# Patient Record
Sex: Female | Born: 1945
Health system: Southern US, Community
[De-identification: ages and names within clinical notes are randomized; demographics above are authoritative.]

## PROBLEM LIST (undated history)

## (undated) DIAGNOSIS — F32A Depression, unspecified: Secondary | ICD-10-CM

## (undated) DIAGNOSIS — R0602 Shortness of breath: Secondary | ICD-10-CM

## (undated) DIAGNOSIS — D649 Anemia, unspecified: Secondary | ICD-10-CM

## (undated) DIAGNOSIS — I5032 Chronic diastolic (congestive) heart failure: Secondary | ICD-10-CM

## (undated) DIAGNOSIS — I099 Rheumatic heart disease, unspecified: Secondary | ICD-10-CM

## (undated) DIAGNOSIS — I10 Essential (primary) hypertension: Secondary | ICD-10-CM

## (undated) DIAGNOSIS — I4891 Unspecified atrial fibrillation: Secondary | ICD-10-CM

## (undated) DIAGNOSIS — K219 Gastro-esophageal reflux disease without esophagitis: Secondary | ICD-10-CM

## (undated) DIAGNOSIS — J45909 Unspecified asthma, uncomplicated: Secondary | ICD-10-CM

## (undated) DIAGNOSIS — E119 Type 2 diabetes mellitus without complications: Secondary | ICD-10-CM

## (undated) DIAGNOSIS — C50919 Malignant neoplasm of unspecified site of unspecified female breast: Secondary | ICD-10-CM

## (undated) DIAGNOSIS — F329 Major depressive disorder, single episode, unspecified: Secondary | ICD-10-CM

## (undated) DIAGNOSIS — K3184 Gastroparesis: Secondary | ICD-10-CM

## (undated) DIAGNOSIS — E039 Hypothyroidism, unspecified: Secondary | ICD-10-CM

## (undated) DIAGNOSIS — I5020 Unspecified systolic (congestive) heart failure: Secondary | ICD-10-CM

## (undated) DIAGNOSIS — K5730 Diverticulosis of large intestine without perforation or abscess without bleeding: Secondary | ICD-10-CM

## (undated) DIAGNOSIS — I499 Cardiac arrhythmia, unspecified: Secondary | ICD-10-CM

## (undated) DIAGNOSIS — R55 Syncope and collapse: Secondary | ICD-10-CM

## (undated) DIAGNOSIS — I6529 Occlusion and stenosis of unspecified carotid artery: Secondary | ICD-10-CM

## (undated) DIAGNOSIS — E785 Hyperlipidemia, unspecified: Secondary | ICD-10-CM

## (undated) DIAGNOSIS — I509 Heart failure, unspecified: Secondary | ICD-10-CM

## (undated) DIAGNOSIS — I4892 Unspecified atrial flutter: Secondary | ICD-10-CM

## (undated) DIAGNOSIS — K649 Unspecified hemorrhoids: Secondary | ICD-10-CM

## (undated) DIAGNOSIS — R011 Cardiac murmur, unspecified: Secondary | ICD-10-CM

## (undated) DIAGNOSIS — Z953 Presence of xenogenic heart valve: Secondary | ICD-10-CM

## (undated) DIAGNOSIS — E871 Hypo-osmolality and hyponatremia: Secondary | ICD-10-CM

## (undated) DIAGNOSIS — I639 Cerebral infarction, unspecified: Secondary | ICD-10-CM

## (undated) DIAGNOSIS — N182 Chronic kidney disease, stage 2 (mild): Secondary | ICD-10-CM

## (undated) DIAGNOSIS — R7301 Impaired fasting glucose: Secondary | ICD-10-CM

## (undated) HISTORY — DX: Gastro-esophageal reflux disease without esophagitis: K21.9

## (undated) HISTORY — DX: Rheumatic heart disease, unspecified: I09.9

## (undated) HISTORY — PX: DILATION AND CURETTAGE OF UTERUS: SHX78

## (undated) HISTORY — PX: CARDIAC CATHETERIZATION: SHX172

## (undated) HISTORY — DX: Hyperlipidemia, unspecified: E78.5

## (undated) HISTORY — DX: Gastroparesis: K31.84

## (undated) HISTORY — DX: Syncope and collapse: R55

## (undated) HISTORY — DX: Anemia, unspecified: D64.9

## (undated) HISTORY — DX: Impaired fasting glucose: R73.01

## (undated) HISTORY — PX: BACK SURGERY: SHX140

## (undated) HISTORY — DX: Unspecified hemorrhoids: K64.9

## (undated) HISTORY — DX: Diverticulosis of large intestine without perforation or abscess without bleeding: K57.30

## (undated) HISTORY — DX: Malignant neoplasm of unspecified site of unspecified female breast: C50.919

## (undated) HISTORY — DX: Cerebral infarction, unspecified: I63.9

## (undated) HISTORY — DX: Essential (primary) hypertension: I10

## (undated) HISTORY — DX: Chronic kidney disease, stage 2 (mild): N18.2

## (undated) HISTORY — DX: Heart failure, unspecified: I50.9

## (undated) HISTORY — DX: Occlusion and stenosis of unspecified carotid artery: I65.29

## (undated) HISTORY — DX: Cardiac arrhythmia, unspecified: I49.9

---
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
## 1971-10-04 HISTORY — PX: TUBAL LIGATION: SHX77

## 1986-04-30 ENCOUNTER — Encounter: Payer: Self-pay | Admitting: Cardiology

## 1986-05-01 ENCOUNTER — Encounter: Payer: Self-pay | Admitting: Cardiology

## 1986-05-02 ENCOUNTER — Encounter: Payer: Self-pay | Admitting: Cardiology

## 1993-10-03 DIAGNOSIS — C50919 Malignant neoplasm of unspecified site of unspecified female breast: Secondary | ICD-10-CM

## 1993-10-03 HISTORY — PX: BREAST LUMPECTOMY: SHX2

## 1993-10-03 HISTORY — DX: Malignant neoplasm of unspecified site of unspecified female breast: C50.919

## 1998-12-29 ENCOUNTER — Ambulatory Visit (HOSPITAL_COMMUNITY): Admission: RE | Admit: 1998-12-29 | Discharge: 1998-12-29 | Payer: Self-pay | Admitting: Pulmonary Disease

## 1999-07-23 ENCOUNTER — Ambulatory Visit (HOSPITAL_COMMUNITY): Admission: RE | Admit: 1999-07-23 | Discharge: 1999-07-23 | Payer: Self-pay | Admitting: Pulmonary Disease

## 2000-02-07 ENCOUNTER — Other Ambulatory Visit: Admission: RE | Admit: 2000-02-07 | Discharge: 2000-02-07 | Payer: Self-pay | Admitting: Gastroenterology

## 2000-02-07 ENCOUNTER — Encounter (INDEPENDENT_AMBULATORY_CARE_PROVIDER_SITE_OTHER): Payer: Self-pay | Admitting: Specialist

## 2000-02-21 ENCOUNTER — Encounter: Payer: Self-pay | Admitting: Gastroenterology

## 2000-02-21 ENCOUNTER — Ambulatory Visit (HOSPITAL_COMMUNITY): Admission: RE | Admit: 2000-02-21 | Discharge: 2000-02-21 | Payer: Self-pay | Admitting: Gastroenterology

## 2001-08-16 ENCOUNTER — Ambulatory Visit (HOSPITAL_COMMUNITY): Admission: RE | Admit: 2001-08-16 | Discharge: 2001-08-16 | Payer: Self-pay | Admitting: Internal Medicine

## 2001-08-28 ENCOUNTER — Encounter: Payer: Self-pay | Admitting: General Surgery

## 2001-08-28 ENCOUNTER — Ambulatory Visit (HOSPITAL_COMMUNITY): Admission: RE | Admit: 2001-08-28 | Discharge: 2001-08-28 | Payer: Self-pay | Admitting: General Surgery

## 2002-09-02 ENCOUNTER — Encounter: Payer: Self-pay | Admitting: General Surgery

## 2002-09-02 ENCOUNTER — Ambulatory Visit (HOSPITAL_COMMUNITY): Admission: RE | Admit: 2002-09-02 | Discharge: 2002-09-02 | Payer: Self-pay | Admitting: General Surgery

## 2002-09-10 ENCOUNTER — Encounter: Payer: Self-pay | Admitting: General Surgery

## 2002-09-10 ENCOUNTER — Ambulatory Visit (HOSPITAL_COMMUNITY): Admission: RE | Admit: 2002-09-10 | Discharge: 2002-09-10 | Payer: Self-pay | Admitting: General Surgery

## 2003-09-08 ENCOUNTER — Ambulatory Visit (HOSPITAL_COMMUNITY): Admission: RE | Admit: 2003-09-08 | Discharge: 2003-09-08 | Payer: Self-pay | Admitting: General Surgery

## 2004-09-03 ENCOUNTER — Ambulatory Visit: Payer: Self-pay | Admitting: Cardiology

## 2004-09-03 ENCOUNTER — Ambulatory Visit (HOSPITAL_COMMUNITY): Admission: RE | Admit: 2004-09-03 | Discharge: 2004-09-03 | Payer: Self-pay | Admitting: Internal Medicine

## 2004-09-23 ENCOUNTER — Ambulatory Visit: Payer: Self-pay | Admitting: Gastroenterology

## 2004-09-24 ENCOUNTER — Ambulatory Visit (HOSPITAL_COMMUNITY): Admission: RE | Admit: 2004-09-24 | Discharge: 2004-09-24 | Payer: Self-pay | Admitting: Internal Medicine

## 2005-09-27 ENCOUNTER — Ambulatory Visit (HOSPITAL_COMMUNITY): Admission: RE | Admit: 2005-09-27 | Discharge: 2005-09-27 | Payer: Self-pay | Admitting: Internal Medicine

## 2006-09-11 ENCOUNTER — Ambulatory Visit: Payer: Self-pay | Admitting: Cardiology

## 2006-09-11 ENCOUNTER — Ambulatory Visit (HOSPITAL_COMMUNITY): Admission: RE | Admit: 2006-09-11 | Discharge: 2006-09-11 | Payer: Self-pay | Admitting: Internal Medicine

## 2006-09-29 ENCOUNTER — Ambulatory Visit (HOSPITAL_COMMUNITY): Admission: RE | Admit: 2006-09-29 | Discharge: 2006-09-29 | Payer: Self-pay | Admitting: Internal Medicine

## 2006-11-16 ENCOUNTER — Observation Stay (HOSPITAL_COMMUNITY): Admission: AD | Admit: 2006-11-16 | Discharge: 2006-11-17 | Payer: Self-pay | Admitting: Internal Medicine

## 2007-10-01 ENCOUNTER — Ambulatory Visit (HOSPITAL_COMMUNITY): Admission: RE | Admit: 2007-10-01 | Discharge: 2007-10-01 | Payer: Self-pay | Admitting: Internal Medicine

## 2008-10-07 ENCOUNTER — Ambulatory Visit (HOSPITAL_COMMUNITY): Admission: RE | Admit: 2008-10-07 | Discharge: 2008-10-07 | Payer: Self-pay | Admitting: Internal Medicine

## 2008-10-07 ENCOUNTER — Ambulatory Visit: Payer: Self-pay | Admitting: Cardiology

## 2008-10-07 ENCOUNTER — Encounter (INDEPENDENT_AMBULATORY_CARE_PROVIDER_SITE_OTHER): Payer: Self-pay | Admitting: Internal Medicine

## 2008-10-22 ENCOUNTER — Ambulatory Visit (HOSPITAL_COMMUNITY): Admission: RE | Admit: 2008-10-22 | Discharge: 2008-10-22 | Payer: Self-pay | Admitting: Internal Medicine

## 2009-02-18 ENCOUNTER — Ambulatory Visit (HOSPITAL_COMMUNITY): Admission: RE | Admit: 2009-02-18 | Discharge: 2009-02-18 | Payer: Self-pay | Admitting: Internal Medicine

## 2009-02-19 ENCOUNTER — Ambulatory Visit (HOSPITAL_COMMUNITY): Admission: RE | Admit: 2009-02-19 | Discharge: 2009-02-19 | Payer: Self-pay | Admitting: Internal Medicine

## 2009-02-20 ENCOUNTER — Telehealth: Payer: Self-pay | Admitting: Internal Medicine

## 2009-02-20 ENCOUNTER — Ambulatory Visit: Payer: Self-pay | Admitting: Internal Medicine

## 2009-02-20 ENCOUNTER — Telehealth (INDEPENDENT_AMBULATORY_CARE_PROVIDER_SITE_OTHER): Payer: Self-pay | Admitting: *Deleted

## 2009-02-20 DIAGNOSIS — R0609 Other forms of dyspnea: Secondary | ICD-10-CM | POA: Insufficient documentation

## 2009-02-20 DIAGNOSIS — R05 Cough: Secondary | ICD-10-CM | POA: Insufficient documentation

## 2009-02-20 DIAGNOSIS — R0989 Other specified symptoms and signs involving the circulatory and respiratory systems: Secondary | ICD-10-CM

## 2009-03-03 ENCOUNTER — Ambulatory Visit: Payer: Self-pay | Admitting: Cardiology

## 2009-03-03 ENCOUNTER — Ambulatory Visit (HOSPITAL_COMMUNITY): Admission: RE | Admit: 2009-03-03 | Discharge: 2009-03-03 | Payer: Self-pay | Admitting: Cardiology

## 2009-03-09 ENCOUNTER — Ambulatory Visit: Payer: Self-pay | Admitting: Cardiology

## 2009-03-09 ENCOUNTER — Encounter (HOSPITAL_COMMUNITY): Admission: RE | Admit: 2009-03-09 | Discharge: 2009-03-09 | Payer: Self-pay | Admitting: Cardiology

## 2009-03-10 ENCOUNTER — Telehealth (INDEPENDENT_AMBULATORY_CARE_PROVIDER_SITE_OTHER): Payer: Self-pay | Admitting: *Deleted

## 2009-03-18 ENCOUNTER — Ambulatory Visit: Payer: Self-pay | Admitting: Cardiology

## 2009-04-01 ENCOUNTER — Ambulatory Visit (HOSPITAL_COMMUNITY): Admission: RE | Admit: 2009-04-01 | Discharge: 2009-04-01 | Payer: Self-pay | Admitting: Cardiology

## 2009-04-03 ENCOUNTER — Ambulatory Visit (HOSPITAL_COMMUNITY): Admission: RE | Admit: 2009-04-03 | Discharge: 2009-04-03 | Payer: Self-pay | Admitting: Cardiology

## 2009-04-14 ENCOUNTER — Telehealth (INDEPENDENT_AMBULATORY_CARE_PROVIDER_SITE_OTHER): Payer: Self-pay

## 2009-04-20 ENCOUNTER — Encounter: Payer: Self-pay | Admitting: Cardiology

## 2009-04-20 ENCOUNTER — Ambulatory Visit: Payer: Self-pay | Admitting: Cardiology

## 2009-06-09 ENCOUNTER — Ambulatory Visit (HOSPITAL_COMMUNITY): Admission: RE | Admit: 2009-06-09 | Discharge: 2009-06-09 | Payer: Self-pay | Admitting: Internal Medicine

## 2009-09-09 ENCOUNTER — Ambulatory Visit: Payer: Self-pay | Admitting: Psychology

## 2009-10-23 ENCOUNTER — Ambulatory Visit (HOSPITAL_COMMUNITY)
Admission: RE | Admit: 2009-10-23 | Discharge: 2009-10-23 | Payer: Self-pay | Source: Home / Self Care | Admitting: Internal Medicine

## 2010-05-21 ENCOUNTER — Ambulatory Visit: Payer: Self-pay | Admitting: Cardiology

## 2010-10-03 DIAGNOSIS — K5730 Diverticulosis of large intestine without perforation or abscess without bleeding: Secondary | ICD-10-CM

## 2010-10-03 HISTORY — PX: COLONOSCOPY: SHX174

## 2010-10-03 HISTORY — DX: Diverticulosis of large intestine without perforation or abscess without bleeding: K57.30

## 2010-10-24 ENCOUNTER — Encounter: Payer: Self-pay | Admitting: Internal Medicine

## 2010-10-25 ENCOUNTER — Ambulatory Visit (HOSPITAL_COMMUNITY)
Admission: RE | Admit: 2010-10-25 | Discharge: 2010-10-25 | Payer: Self-pay | Source: Home / Self Care | Attending: Internal Medicine | Admitting: Internal Medicine

## 2010-11-04 NOTE — Assessment & Plan Note (Signed)
Summary: 1 YR F/U PER CHECKOUT ON 04/20/09/TG   Visit Type:  Follow-up Referring Provider:  . Primary Provider:  Osborne Casco   History of Present Illness: [name] returns to the office for continued assessment and treatment of rheumatic heart disease.  Since I last saw her one year ago, she has done quite well.  She has not been evaluated in the emergency department or admitted to the hospital.  She has developed impaired memory evaluated by a neurologist.  He concluded that her memory disturbance was related to depression and anxiety.  [name] has continued to perform housework and play golf without much difficulty.  She experiences dyspnea when walking up a hill at a rapid rate.  This resolves promptly with rest.  She has had no chest pain, palpitations, lightheadedness or syncope.  Current Medications (verified): 1)  Synthroid 88 Mcg Tabs (Levothyroxine Sodium) .Marland Kitchen.. 1 Once Daily 2)  Lipitor 10 Mg Tabs (Atorvastatin Calcium) .Marland Kitchen.. 1 Once Daily 3)  Reglan 10 Mg Tabs (Metoclopramide Hcl) .Marland Kitchen.. 1 By Mouth Three Times A Day 4)  Prilosec 20 Mg Cpdr (Omeprazole) .... Take One 30-60 Min Before First and Last Meals of The Day 5)  Daily Multiple Vitamins  Tabs (Multiple Vitamin) .Marland Kitchen.. 1 Tab Once Daily 6)  Cozaar 100 Mg Tabs (Losartan Potassium) .... 1/2 Tab Once Daily 7)  Vitamins C E 500-400 Mg-Unit Caps (Vitamins C E) .Marland Kitchen.. 1 Tab Once Daily 8)  Citracal/vitamin D 250-200 Mg-Unit Tabs (Calcium Citrate-Vitamin D) .... 2 Tabs Once Daily 9)  Amoxicillin 400 Mg/3ml Susr (Amoxicillin) .... Take Before Dental Work 10)  Bupropion Hcl 150 Mg Xr24h-Tab (Bupropion Hcl) .... Take 1 Tab Daily  Allergies (verified): No Known Drug Allergies  Past History:  PMH, FH, and Social History reviewed and updated.  Past Medical History: Rheumatic heart disease-mild AS/AI and mild MS/trivial MR Hx of NEOPLASM, MALIGNANT, BREAST (ICD-174.9) HYPERLIPIDEMIA (ICD-272.4) ASTHMA (ICD-493.90) GERD with  gastroparesis  Review of Systems       See history of present illness.  Vital Signs:  Patient profile:   65 year old female Weight:      167 pounds BMI:     30.17 Pulse rate:   74 / minute BP sitting:   104 / 62  (right arm)  Vitals Entered By: Dreama Saa, CNA (May 21, 2010 3:18 PM)  Physical Exam  General:  Somewhat overweight; well developed; no acute distress:   Neck-No JVD; no carotid bruits: Lungs-No tachypnea, no rales; no rhonchi; no wheezes: Cardiovascular-normal PMI; normal S1; slightly decreased A2; modest basilar systolic ejection murmur; minimal apical systolic murmur Abdomen-BS normal; soft and non-tender without masses or organomegaly:  Musculoskeletal-No deformities, no cyanosis or clubbing: Neurologic-Normal cranial nerves; symmetric strength and tone:  Skin-Warm, no significant lesions: Extremities-Nl distal pulses; no edema:     Impression & Recommendations:  Problem # 1:  RHEUMATIC HEART DISEASE (ICD-398.90) [name] is stable with respect to valvular heart disease.  Her valvular lesions remain mild by exam and do not appear related to any symptoms.  I will continue to monitor the progress of her disease with annual visits and occasional echocardiograms.  According to current guidelines, she does not require antibiotics prior to dental work or other medical procedures which entail a risk of bacteremia.  Patient Instructions: 1)  Your physician recommends that you schedule a follow-up appointment in: 1 year 2)  pt does not need anitbiotic prior to dental work

## 2010-12-30 ENCOUNTER — Encounter (INDEPENDENT_AMBULATORY_CARE_PROVIDER_SITE_OTHER): Payer: Self-pay | Admitting: Internal Medicine

## 2011-01-05 ENCOUNTER — Encounter (HOSPITAL_BASED_OUTPATIENT_CLINIC_OR_DEPARTMENT_OTHER): Payer: Medicare Other | Admitting: Internal Medicine

## 2011-01-05 ENCOUNTER — Ambulatory Visit (HOSPITAL_COMMUNITY)
Admission: RE | Admit: 2011-01-05 | Discharge: 2011-01-05 | Disposition: A | Discharge status: Home / Self Care | Payer: Medicare Other | Source: Ambulatory Visit | Attending: Internal Medicine | Admitting: Internal Medicine

## 2011-01-05 DIAGNOSIS — K644 Residual hemorrhoidal skin tags: Secondary | ICD-10-CM

## 2011-01-05 DIAGNOSIS — Z1211 Encounter for screening for malignant neoplasm of colon: Secondary | ICD-10-CM

## 2011-01-05 DIAGNOSIS — I10 Essential (primary) hypertension: Secondary | ICD-10-CM | POA: Insufficient documentation

## 2011-01-05 DIAGNOSIS — K5730 Diverticulosis of large intestine without perforation or abscess without bleeding: Secondary | ICD-10-CM

## 2011-01-05 DIAGNOSIS — E785 Hyperlipidemia, unspecified: Secondary | ICD-10-CM | POA: Insufficient documentation

## 2011-01-18 NOTE — Op Note (Signed)
  [name], [name]               ACCOUNT NO.:  [id number]  MEDICAL RECORD NO.:  [id number]           PATIENT TYPE:  O  LOCATION:  DAYP                          FACILITY:  [facility]  PHYSICIAN:  Lionel December, M.D.    DATE OF BIRTH:  Dec 03, 1945  DATE OF PROCEDURE:  01/05/2011 DATE OF DISCHARGE:                              OPERATIVE REPORT   PROCEDURE:  Colonoscopy.  INDICATION:  Gearldean is a 65 year old Caucasian female who is undergoing average risk screening colonoscopy.  Her last exam was 10 years ago. Procedure risks were reviewed with the patient.  Informed consent was obtained.  MEDICATIONS FOR CONSCIOUS SEDATION:  Demerol 50 mg IV and Versed 4 mg IV.  FINDINGS:  Procedure performed in endoscopy suite.  The patient's vital signs and O2 sat were monitored during the procedure and remained stable.  The patient was placed in left lateral recumbent position and rectal examination performed.  No abnormality noted on external or digital exam.  Pentax videoscope was placed in rectum and advanced under vision into sigmoid colon beyond.  Preparation was excellent.  She had moderate number of small to medium-sized diverticula at sigmoid colon. Scope was passed into cecum which was identified by appendiceal orifice and ileocecal valve.  Pictures were taken for the record.  As the scope was withdrawn, colonic mucosa was carefully examined and no polyps and/or tumor masses were noted.  Rectal mucosa was normal.  Scope was retroflexed to examine anorectal junction and small hemorrhoids noted below the dentate line.  Endoscope was then withdrawn.  Withdrawal time was 11 minutes.  The patient tolerated the procedure well.  FINAL DIAGNOSES: 1. Examination performed to cecum. 2. Moderate number of small to medium-sized diverticula at sigmoid     colon. 3. Small external hemorrhoids.  RECOMMENDATIONS: 1. Standard instructions given. 2. High-fiber diet. 3. Consider next screening exam in  10 years.     Lionel December, M.D.     NR/MEDQ  D:  01/05/2011  T:  01/05/2011  Job:  [id number]  cc:   Dr. Ouida Sills  Electronically Signed by Lionel December M.D. on 01/18/2011 10:46:06 AM

## 2011-02-15 NOTE — Letter (Signed)
March 03, 2009    Kingsley Callander. Ouida Sills, MD  [address]  [place], [place] [address]   RE:  [name], [name]  MRN:  [id number]  /  DOB:  10-27-1945   Dear Channing Mutters,   [name] is seen in the office at the request of Dr. Sherene Sires for  rheumatic heart disease and possible congestive heart failure.  As you  know, this nice woman has had a problem with chronic and persistent  nonproductive cough for approximately 6 months.  She was evaluated by  Dr. Sherene Sires, who noted positive pH probe test and a question of ACE-induced  cough.  She was switched to an ARB, which is being utilized for mild  microalbuminuria in the setting of borderline diabetes that has been  treated with dietary measures.  She has not improved.  Chest x-ray  showed question of pleural-based tumor on the left.  By CT scan, this  proved to represent a pleural effusion - she in fact had bilateral  pleural effusions with some compressive atelectasis, but no pulmonary  edema.  A BNP level was normal.  She had progressive dyspnea on exertion  and some moderate ankle edema.  She describes one episode of sudden  fatigue and dyspnea while playing golf that required her to immediately  rest and to gradually work her way back to the clubhouse.  She has had  some chest tightness that has not particularly been exertional.   Her cardiac history dates to age 74 when she underwent a closed mitral  valve commissurotomy.  She subsequently has had no cardiac problems.  She underwent cardiac catheterization in 1990 revealing no coronary  disease and no hemodynamically significant valvular heart disease.  She  has never been known to have an arrhythmia.  She has not noted  palpitations.  She has no orthopnea nor PND.  Her cough is typically  worse at night.   She also has gastroparesis with borderline diabetes.  Fasting blood  glucose was previously minimally elevated; more recent values have been  closer to 140.  She has not required medical therapy.   Hemoglobin A1c  levels are normal.   She also has a history of mild hypothyroidism and mild hyperlipidemia,  both treated well medically.  She underwent an excisional biopsy and  radiation therapy for carcinoma of the right breast in 1995.  Her only  other surgery is a bilateral tubal ligation.   CURRENT MEDICATIONS:  1. Triamterene/hydrochlorothiazide 37.5/25 mg daily.  2. Levothyroxine 0.088 mg daily.  3. Metoclopramide 10 mg q.i.d.  4. Losartan 100 mg daily.  5. Atorvastatin 10 mg daily.  6. Omeprazole 20 mg b.i.d.  7. Tussionex.   SOCIAL HISTORY:  Married with one daughter, who is a Editor, commissioning.  She had a Psychologist, educational and was exercising daily a few months  ago, but has discontinued this due to her recent medical problems.  She  does not use tobacco products nor alcohol.   FAMILY HISTORY:  Father died due to myocardial infarction and mother due  to COPD.  She has three siblings, one of whom died due to myocardial  infarction.  Her sister also has heart problems and is diabetic.   REVIEW OF SYSTEMS:  Notable for some dizziness, not necessarily  orthostatic, the need for corrective lenses, partial dentures, a history  of colitis, GERD, urinary frequency, and mild residual edema following  institution of diuretics.  She has lost 5 pounds since she started  Dyazide.  PHYSICAL EXAMINATION:  GENERAL:  Pleasant proportionate woman in no  acute distress.  VITAL SIGNS:  The weight is 162 pounds, unchanged from previous  evaluation in July 2000, blood pressure 100/55, heart rate 75 and  regular, respirations 14 and unlabored.  HEENT:  EOMs full; normal oral mucosa.  NECK:  No jugular venous distention; normal carotid upstrokes without  bruits.  ENDOCRINE:  No thyromegaly.  HEMATOPOIETIC:  No adenopathy.  NEUROLOGIC:  Symmetric strength and tone; normal cranial nerves.  PSYCHIATRIC:  Alert and oriented; normal affect.  CARDIAC:  Normal first and second heart sounds; grade  2/6 basilar  systolic ejection murmur; no diastolic murmur appreciated; no opening  snap.  LUNGS:  Clear; resonant to percussion.  ABDOMEN:  Soft and nontender; no organomegaly.  EXTREMITIES:  No edema; normal distal pulses.   LABORATORY DATA:  Recent laboratory includes a normal CBC, normal BNP  level, a D-dimer of 1.49, and a creatinine of 1.06.   Her echocardiogram shows a normal left ventricle.  She has mild aortic  stenosis, mild aortic insufficiency, mild mitral stenosis, and mild  mitral regurgitation.  Estimated right heart pressure is slightly  elevated.   EKG:  Normal sinus rhythm; borderline left atrial abnormality; delayed R-  wave progression; leftward axis.   IMPRESSION:  [name] does not have classic congestive heart failure.  Typically, the problem would become manifests, if the cause of cough, in  well less than 6 months.  She has a normal BNP level and no pulmonary  edema.  Nonetheless, her pleural effusions and pedal edema could  represent some congestive heart failure.  It is unclear whether or not a  rheumatic cardiomyopathy actually exists.  We will recheck her chest x-  ray to verify that her pleural effusions have resolved.  A stress test  will be performed due to her history of chest tightness and her  exertional dyspnea, which could represent an ischemic equivalent.  With  a normal catheterization 20 years ago and unimpressive vascular risk  factors, I do not think this is highly likely.  Chemistry profile will  be rechecked in light of her use of diuretics.   Arrhythmia could contribute to her symptoms as well.  Most notably,  atrial fibrillation is classically associated with a decompensation of  rheumatic heart disease.  If this were the case, her arrhythmia is  paroxysmal.  In the absence of recurrent symptoms, we will not plan to  perform event recording at present.  I will reevaluate this nice woman  after the above studies are completed.     Sincerely,      Gerrit Friends. Dietrich Pates, MD, [facility]  Electronically Signed    RMR/MedQ  DD: 03/03/2009  DT: 03/04/2009  Job #: [id number]

## 2011-02-15 NOTE — Letter (Signed)
March 18, 2009    [name]. Sara Sills, MD  [address]  [place], [place] [address]   RE:  [name], [name]  MRN:  [id number]  /  DOB:  05/04/1946   Dear [name],   [name] returns to the office for continued assessment and treatment  of possible congestive heart failure and rheumatic heart disease.  Since  her last visit, she has done fairly well.  She has not yet resumed  exercise, but her cough and malaise are somewhat better.  Her initial  laboratory studies showed acute renal insufficiency with a creatinine of  2.1.  In response, we stopped her diuretic and her losartan.  Her  creatinine subsequently fell to 1.26.  I reviewed her radiographic  studies with the radiologist.  She did have a very impressive  pseudotumor on the left and a small-to-moderate pleural effusion on the  right when the CT scan was performed.  Her chest x-ray 2 weeks ago was  clear of those findings.   The recent lab is notable for normal CBC, normal thyroid function  studies, and a BNP level of 92.   Medications are unchanged from her last visit except for discontinuation  of triamterene, hydrochlorothiazide, and losartan.   PHYSICAL EXAMINATION:  GENERAL:  Pleasant well-appearing woman.  VITAL SIGNS:  The weight is 167, 5 pounds more than at her last visit.  Blood pressure 130/75, heart rate 75 and regular.  NECK:  No jugular venous distention; transmitted murmur bilaterally.  LUNGS:  Clear.  CARDIAC:  Normal first and second heart sounds; grade 2-3/6 systolic  ejection murmur at the base.  ABDOMEN:  Soft and nontender; no organomegaly.  EXTREMITIES:  Trace ankle edema.   IMPRESSION:  [name] presentation remains somewhat confusing.  She  had radiographic findings for congestive heart failure, but essentially  a normal BMP level.  She was started on a relatively mild diuretic  without improvement in symptoms, but with resolution of ankle edema and  her pleural effusions.  Two 2 weeks off  diuretic, she has gained 5  pounds, but has no significant recurrent edema and no exacerbation of  her symptoms.   At this point, I am inclined to resume her losartan at a reduced dose of  50 mg daily, but continue to hold diuretic.  She will follow weights at  home.  We will have her return in 2 weeks for repeat chemistry profile  and chest x-ray.  I will see this nice woman again in 1 month.    Sincerely,      Gerrit Friends. Dietrich Pates, MD, [facility]  Electronically Signed    RMR/MedQ  DD: 03/18/2009  DT: 03/19/2009  Job #: [id number]   CC:    Charlaine Dalton. Sherene Sires, MD, FCCP

## 2011-02-18 NOTE — Discharge Summary (Signed)
[name], [name]               ACCOUNT NO.:  [id number]   MEDICAL RECORD NO.:  [id number]          PATIENT TYPE:  OBV   LOCATION:  A206                          FACILITY:  [facility]   PHYSICIAN:  Kingsley Callander. Ouida Sills, MD       DATE OF BIRTH:  1945-10-31   DATE OF ADMISSION:  11/16/2006  DATE OF DISCHARGE:  02/15/2008LH                               DISCHARGE SUMMARY   DISCHARGE DIAGNOSES:  1. Head trauma with subarachnoid bleeding, scalp laceration and      hematoma.  2. History of breast cancer.  3. History of rheumatic heart disease.  4. Hypothyroidism.  5. Hyperlipidemia.  6. Gastroparesis.  7. Impaired fasting glucose.   HOSPITAL COURSE:  This patient is a 65 year old female who fell on the  ice and struck the back of her head.  She sustained a scalp laceration  and hematoma. This was evaluated and treated at the [facility] Emergency  Room.  A CT scan of the brain revealed a subarachnoid hemorrhage in the  right high parietal area.  She was therefore hospitalized here [facility] for observation.  A repeat CT scan of the brain revealed stable  findings.  She remained neurologically intact.  She had frequent  neurologic checks.  She is felt to be stable for discharge with followup  in the office in 1 week.  Her wound is well approximated.  Sutures will  be removed in 1 week.   DISCHARGE MEDICATIONS:  1. Synthroid 88 mcg daily.  2. Lipitor 10 mg daily.  3. Prilosec 20 mg daily.  4. Reglan 10 mg q.i.d.  5. Premarin cream weekly.      Kingsley Callander. Ouida Sills, MD  Electronically Signed     ROF/MEDQ  D:  11/19/2006  T:  11/19/2006  Job:  [id number]

## 2011-02-18 NOTE — H&P (Signed)
[name], [name]               ACCOUNT NO.:  [id number]   MEDICAL RECORD NO.:  [id number]          PATIENT TYPE:  OBV   LOCATION:  A206                          FACILITY:  [facility]   PHYSICIAN:  Kingsley Callander. Ouida Sills, MD       DATE OF BIRTH:  1946-01-27   DATE OF ADMISSION:  11/16/2006  DATE OF DISCHARGE:  02/15/2008LH                              HISTORY & PHYSICAL   CHIEF COMPLAINT:  Head injury.   HISTORY OF PRESENT ILLNESS:  This patient is a 65 year old white female  who presented to the [facility] ER after falling and hitting the back of  her head.  She sustained a laceration which was successfully stable.  She underwent a CT scan of the brain which revealed a subarachnoid  hemorrhage in the high parietal area.  She has had no complications thus  far. She has not had loss of consciousness, blurring of vision,  weakness, or change in speech.  She was sent over [facility] for  observation. The case was discussed with neurology. She had slipped on  the ice.   PAST MEDICAL HISTORY:  1. Right right breast cancer (ductal carcinoma in situ treated with      partial mastectomy and radiation therapy in 1995).  2. Hypothyroidism.  3. Rheumatic heart disease status post mitral commissurotomy in 1962.  4. Hyperlipidemia.  5. Impaired fasting glucose.  6. Gastroparesis.  7. Atrophic vaginitis.  8. Colonoscopy in 2001 revealing diverticulosis.  9. Tubal ligation in 1973.  10.Benign right breast lumpectomy in 1988.   MEDICATIONS:  Synthroid 88 mcg daily, Lipitor 10 mg daily, omeprazole 20  mg daily, Reglan 10 mg q.i.d., Premarin cream weekly.   ALLERGIES:  None.   SOCIAL HISTORY:  She does not smoke, drink or use recreational drugs.  She lives with her husband.   FAMILY HISTORY:  Her father had cirrhosis.  Her mother had a stroke. Her  sister has had cervical cancer, hypothyroidism and diabetes.   REVIEW OF SYSTEMS:  Noncontributory.   PHYSICAL EXAMINATION:  GENERAL:  Alert and  oriented.  HEENT: She has a stable laceration on the back of her head and a  surrounding hematoma.  No palpable fracture.  The eyes and oropharynx  are unremarkable.  NECK:  Supple without adenopathy.  LUNGS: Clear.  HEART: Regular with a grade 2 systolic murmur.  ABDOMEN:  Nontender.  No hepatosplenomegaly.  EXTREMITIES: No edema.  NEURO:  Face symmetric.  Cranial nerves 2-12 intact. Speech intact.  Finger-to-nose testing normal. Strength is full.   LABORATORY DATA:  CBC is unremarkable. Her CT of the brain was reviewed  with Dr. Alver Fisher.   IMPRESSION:  1. Head trauma with resultant scalp laceration, soft tissue hematoma,      and subarachnoid bleeding.  Will observe overnight and perform      follow up CT scan on February 15.  Will monitor closely with      frequent neurological checks.  2. History of right breast carcinoma.  3. Hypothyroidism.  4. Rheumatic heart disease.  5. Hyperlipidemia.  6. Impaired fasting glucose.  7. Gastroparesis.  Kingsley Callander. Ouida Sills, MD  Electronically Signed     ROF/MEDQ  D:  11/17/2006  T:  11/17/2006  Job:  [id number]

## 2011-02-18 NOTE — Procedures (Signed)
[name], [name]               ACCOUNT NO.:  [id number]   MEDICAL RECORD NO.:  [id number]          PATIENT TYPE:  OUT   LOCATION:  RAD                           FACILITY:  [facility]   PHYSICIAN:  Gerrit Friends. Dietrich Pates, MD, FACCDATE OF BIRTH:  Dec 11, 1945   DATE OF PROCEDURE:  09/11/2006  DATE OF DISCHARGE:                                ECHOCARDIOGRAM   REFERRING:  Dr. Ouida Sills.   CLINICAL DATA:  A 65 year old woman with rheumatic heart disease.  M-  mode aorta 2.7, left atrium 4.0, septum 0.9, posterior wall 1.3, LV  diastole 4.3, LV systole 3.3.  1. Technically suboptimal but adequate echocardiographic study.  2. Mild left atrial enlargement; right atrial size at the upper limit      of normal.  3. Right ventricular size at the upper limit of normal as is wall      thickness; normal RV systolic function.  4. Mitral valve is mildly thickened with a typical rheumatic      appearance; there is doming of the anterior leaflet and immobility      of the posterior leaflet.  Mild stenosis is present by Doppler      criteria with an estimated valve area of 1.7-2 cm2.  5. The aortic valve is mildly sclerotic and is trileaflet.  Doppler      indicates a mean gradient of approximately 20-25 mmHg indicating      mild stenosis.  6. The internal dimension of the aortic root is normal; there is very      mild calcification of the wall and aortic annulus.  7. Normal pulmonic valve and proximal pulmonary artery.  8. Normal tricuspid valve with physiologic regurgitation and slightly      elevated estimated RV systolic pressure.  9. Normal IVC.  10.Normal internal dimension, wall thickness, regional and global      function of the left ventricle.  11.Comparison with prior study of September 03, 2004:  No change in      severity of mitral valve disease; some progression in aortic valve      disease.      Gerrit Friends. Dietrich Pates, MD, [facility]  Electronically Signed     RMR/MEDQ  D:  09/11/2006  T:   09/11/2006  Job:  [id number]

## 2011-02-18 NOTE — Procedures (Signed)
[name], [name]               ACCOUNT NO.:  [id number]   MEDICAL RECORD NO.:  [id number]          PATIENT TYPE:  OUT   LOCATION:  RAD                           FACILITY:  [facility]   PHYSICIAN:  Vida Roller, M.D.   DATE OF BIRTH:  1946-09-04   DATE OF PROCEDURE:  09/03/2004  DATE OF DISCHARGE:                                  ECHOCARDIOGRAM   PRIMARY CARE PHYSICIAN:  Dr. Kingsley Callander. [place].   TAPE NUMBER:  LB [id number], tape count 1975 through 2716.   INDICATION:  This is an evaluation for rheumatic heart disease.  Previous  studies done in November of 2002 shows preserved left ventricular systolic  function with borderline LVH, thickened mitral valve with some mild mitral  stenosis, mild to moderate calcification of the annulus, and left atrial  enlargement.   TECHNICAL QUALITY:  The technical quality of this study is adequate.   M-MODE TRACINGS:  1.  The aorta is 25 mm.  2.  The left atrium is 42 mm.  3.  The septum is 10 mm.  4.  The posterior wall is 10 mm.  5.  The left ventricular diastolic dimension is 42 mm.  6.  The left ventricular systolic dimension is 30 mm.   2-D AND DOPPLER IMAGING:  1.  The left ventricle is normal size with normal systolic function.      Estimated ejection fraction 55-60%.  There are no obvious wall motion      abnormalities.  2.  The right ventricle appears to be normal size with normal systolic      function.  The tricuspid regurgitation was not significant, so therefore      there was no assessment of right ventricular systolic pressure.  3.  Both atria appear to be enlarged, left greater than right.  4.  The aortic valve is sclerotic.  There is evidence of mild decreased flow      across the valve with a peak gradient of about 23 mmHg, giving an      estimate of mild aortic stenosis.  There is mild aortic insufficiency.  5.  The mitral valve is significantly thickened.  The anterior leaflet domes      in a manner consistent with rheumatic heart  disease.  There is increased      flow across the valve with a mean gradient of 5 mmHg giving an estimated      valve area of about 1.6 cm squared which is moderate mitral stenosis.      There is mild regurgitation although this may be significantly      underestimated due to the mitral annular calcification and thickening of      the valve which shadows the Doppler profile.  6.  There is a delicate appearing tricuspid valve with no significant      regurgitation.  7.  The pulmonic valve was not well seen.  8.  There is no pericardial effusion.  9.  The inferior vena cava appears to be normal size.  10. The ascending aorta was not well seen.  Trey Paula   JH/MEDQ  D:  09/03/2004  T:  09/03/2004  Job:  [id number]

## 2011-09-19 ENCOUNTER — Other Ambulatory Visit (HOSPITAL_COMMUNITY): Payer: Self-pay | Admitting: Internal Medicine

## 2011-09-19 DIAGNOSIS — Z139 Encounter for screening, unspecified: Secondary | ICD-10-CM

## 2011-09-22 ENCOUNTER — Encounter: Payer: Self-pay | Admitting: Cardiology

## 2011-10-26 ENCOUNTER — Ambulatory Visit (HOSPITAL_COMMUNITY)
Admission: RE | Admit: 2011-10-26 | Discharge: 2011-10-26 | Disposition: A | Discharge status: Home / Self Care | Payer: Medicare Other | Source: Ambulatory Visit | Attending: Internal Medicine | Admitting: Internal Medicine

## 2011-10-26 DIAGNOSIS — I359 Nonrheumatic aortic valve disorder, unspecified: Secondary | ICD-10-CM

## 2011-10-26 DIAGNOSIS — I080 Rheumatic disorders of both mitral and aortic valves: Secondary | ICD-10-CM | POA: Insufficient documentation

## 2011-10-26 NOTE — Progress Notes (Signed)
*  PRELIMINARY RESULTS* Echocardiogram 2D Echocardiogram has been performed.  [name] 10/26/2011, 9:31 AM

## 2011-10-27 ENCOUNTER — Ambulatory Visit (HOSPITAL_COMMUNITY)
Admission: RE | Admit: 2011-10-27 | Discharge: 2011-10-27 | Disposition: A | Discharge status: Home / Self Care | Payer: Medicare Other | Source: Ambulatory Visit | Attending: Internal Medicine | Admitting: Internal Medicine

## 2011-10-27 DIAGNOSIS — Z1231 Encounter for screening mammogram for malignant neoplasm of breast: Secondary | ICD-10-CM | POA: Insufficient documentation

## 2011-10-27 DIAGNOSIS — Z139 Encounter for screening, unspecified: Secondary | ICD-10-CM

## 2011-11-06 ENCOUNTER — Encounter: Payer: Self-pay | Admitting: Cardiology

## 2011-11-06 DIAGNOSIS — J45909 Unspecified asthma, uncomplicated: Secondary | ICD-10-CM | POA: Insufficient documentation

## 2011-11-06 DIAGNOSIS — K219 Gastro-esophageal reflux disease without esophagitis: Secondary | ICD-10-CM | POA: Insufficient documentation

## 2011-11-06 DIAGNOSIS — C50919 Malignant neoplasm of unspecified site of unspecified female breast: Secondary | ICD-10-CM | POA: Insufficient documentation

## 2011-11-07 ENCOUNTER — Encounter: Payer: Self-pay | Admitting: Cardiology

## 2011-11-07 ENCOUNTER — Ambulatory Visit (INDEPENDENT_AMBULATORY_CARE_PROVIDER_SITE_OTHER): Payer: Medicare Other | Admitting: Cardiology

## 2011-11-07 DIAGNOSIS — J45909 Unspecified asthma, uncomplicated: Secondary | ICD-10-CM

## 2011-11-07 DIAGNOSIS — I099 Rheumatic heart disease, unspecified: Secondary | ICD-10-CM

## 2011-11-07 DIAGNOSIS — E782 Mixed hyperlipidemia: Secondary | ICD-10-CM | POA: Insufficient documentation

## 2011-11-07 DIAGNOSIS — I10 Essential (primary) hypertension: Secondary | ICD-10-CM | POA: Insufficient documentation

## 2011-11-07 DIAGNOSIS — C50919 Malignant neoplasm of unspecified site of unspecified female breast: Secondary | ICD-10-CM

## 2011-11-07 DIAGNOSIS — E785 Hyperlipidemia, unspecified: Secondary | ICD-10-CM | POA: Insufficient documentation

## 2011-11-07 NOTE — Progress Notes (Signed)
Patient ID: [name], female   DOB: [birth year], 66 y.o.   MRN: [id number] HPI: Return visit at the request of Dr. Ouida Sills after patient lost to follow-up.  At her last visit 18 months ago, she was advised that antibiotic prophylaxis for dental work was no longer necessary and that annual follow-up would be; however, she and her husband misunderstood and thought that no further cardiology care was necessary.  An echocardiogram was performed last month which demonstrated some progression of valvular heart disease prompting this reassessment.  Symptomatically, she is unchanged with class II dyspnea on exertion, no chest discomfort and no syncope.  Prior to Admission medications   Medication Sig Start Date End Date Taking? Authorizing Provider  atorvastatin (LIPITOR) 10 MG tablet Take 10 mg by mouth daily.     Yes Historical Provider, MD  buPROPion (WELLBUTRIN XL) 150 MG 24 hr tablet Take 150 mg by mouth daily.     Yes Historical Provider, MD  Calcium Citrate-Vitamin D (CITRACAL PETITES/VITAMIN D) 200-250 MG-UNIT TABS Take 2 tablets by mouth daily.     Yes Historical Provider, MD  levothyroxine (SYNTHROID, LEVOTHROID) 88 MCG tablet Take 88 mcg by mouth as directed.    Yes Historical Provider, MD  losartan (COZAAR) 100 MG tablet Take 50 mg by mouth daily.     Yes Historical Provider, MD  omeprazole (PRILOSEC) 20 MG capsule Take 20 mg by mouth 2 (two) times daily with a meal.     Yes Historical Provider, MD  Vitamins C E 500-400 MG-UNIT CAPS Take 1 capsule by mouth daily.     Yes Historical Provider, MD  No Known Allergies    Past medical history, social history, and family history reviewed and updated.  ROS: See history of present illness.  PHYSICAL EXAM: BP 123/71  Pulse 72  Resp 16  Ht 5\' 2"  (1.575 m)  Wt 78.019 kg (172 lb)  BMI 31.46 kg/m2  General-Well developed; no acute distress Body habitus-mildly overweight Neck-No JVD; bilateral carotid bruits vs transmitted murmur; normal carotid  upstrokes Lungs-clear lung fields; resonant to percussion Cardiovascular-normal PMI; normal S1, S2 decreased but present; grade 2/6 basilar early to mid peaking systolic ejection murmur; no diastolic murmur appreciated Abdomen-normal bowel sounds; soft and non-tender without masses or organomegaly Musculoskeletal-No deformities, no cyanosis or clubbing Neurologic-Normal cranial nerves; symmetric strength and tone Skin-Warm, no significant lesions Extremities-distal pulses intact; no edema  ASSESSMENT AND PLAN:  Gibson Bing, MD 11/07/2011 4:56 PM

## 2011-11-07 NOTE — Assessment & Plan Note (Signed)
No recent symptoms to suggest reactive airways disease.

## 2011-11-07 NOTE — Assessment & Plan Note (Signed)
Patient continues to have no evidence for recurrent disease, now nearly 20 years following surgery.

## 2011-11-07 NOTE — Patient Instructions (Signed)
Your physician recommends that you schedule a follow-up appointment in: 1 year  

## 2011-11-07 NOTE — Assessment & Plan Note (Signed)
Blood pressure control is excellent; current medication will be continued. 

## 2011-11-07 NOTE — Assessment & Plan Note (Signed)
No laboratory results available to support or refute this diagnosis.  Test results will be sought from patient's PCP.

## 2011-11-07 NOTE — Assessment & Plan Note (Signed)
There may have been some progression of valvular disease, but intervention will not be warranted unless and until the patient developed symptoms clearly related to her valvular disease.  Dyspnea has been present for some time, is not progressive and is likely related to deconditioning.  She has had an amazingly good result with close mitral valvulotomy, and based upon her echocardiograms will not likely require further attention to the mitral valve.  Aortic valve disease as moderately severe, but does not require further attention at present.  I will continue to see this nice woman at annual intervals.

## 2011-11-10 ENCOUNTER — Encounter: Payer: Self-pay | Admitting: Cardiology

## 2012-01-02 DIAGNOSIS — I639 Cerebral infarction, unspecified: Secondary | ICD-10-CM

## 2012-01-02 HISTORY — DX: Cerebral infarction, unspecified: I63.9

## 2012-01-21 ENCOUNTER — Inpatient Hospital Stay (HOSPITAL_COMMUNITY)
Admission: EM | Admit: 2012-01-21 | Discharge: 2012-01-24 | DRG: 066 | Disposition: A | Discharge status: Home / Self Care | Payer: Medicare Other | Attending: Internal Medicine | Admitting: Internal Medicine

## 2012-01-21 ENCOUNTER — Encounter (HOSPITAL_COMMUNITY): Payer: Self-pay | Admitting: Emergency Medicine

## 2012-01-21 ENCOUNTER — Emergency Department (HOSPITAL_COMMUNITY): Payer: Medicare Other

## 2012-01-21 DIAGNOSIS — I6350 Cerebral infarction due to unspecified occlusion or stenosis of unspecified cerebral artery: Principal | ICD-10-CM | POA: Diagnosis present

## 2012-01-21 DIAGNOSIS — R269 Unspecified abnormalities of gait and mobility: Secondary | ICD-10-CM | POA: Diagnosis present

## 2012-01-21 DIAGNOSIS — E119 Type 2 diabetes mellitus without complications: Secondary | ICD-10-CM | POA: Diagnosis present

## 2012-01-21 DIAGNOSIS — I099 Rheumatic heart disease, unspecified: Secondary | ICD-10-CM

## 2012-01-21 DIAGNOSIS — E782 Mixed hyperlipidemia: Secondary | ICD-10-CM | POA: Diagnosis present

## 2012-01-21 DIAGNOSIS — I050 Rheumatic mitral stenosis: Secondary | ICD-10-CM | POA: Diagnosis present

## 2012-01-21 DIAGNOSIS — Z853 Personal history of malignant neoplasm of breast: Secondary | ICD-10-CM

## 2012-01-21 DIAGNOSIS — E039 Hypothyroidism, unspecified: Secondary | ICD-10-CM

## 2012-01-21 DIAGNOSIS — C50919 Malignant neoplasm of unspecified site of unspecified female breast: Secondary | ICD-10-CM

## 2012-01-21 DIAGNOSIS — I639 Cerebral infarction, unspecified: Secondary | ICD-10-CM

## 2012-01-21 DIAGNOSIS — E785 Hyperlipidemia, unspecified: Secondary | ICD-10-CM | POA: Diagnosis present

## 2012-01-21 DIAGNOSIS — I10 Essential (primary) hypertension: Secondary | ICD-10-CM | POA: Diagnosis present

## 2012-01-21 DIAGNOSIS — K219 Gastro-esophageal reflux disease without esophagitis: Secondary | ICD-10-CM | POA: Diagnosis present

## 2012-01-21 HISTORY — DX: Major depressive disorder, single episode, unspecified: F32.9

## 2012-01-21 HISTORY — DX: Cardiac murmur, unspecified: R01.1

## 2012-01-21 HISTORY — DX: Shortness of breath: R06.02

## 2012-01-21 HISTORY — DX: Depression, unspecified: F32.A

## 2012-01-21 LAB — DIFFERENTIAL
Basophils Absolute: 0 10*3/uL (ref 0.0–0.1)
Basophils Relative: 1 % (ref 0–1)
Eosinophils Relative: 2 % (ref 0–5)
Monocytes Absolute: 0.6 10*3/uL (ref 0.1–1.0)
Neutro Abs: 5.5 10*3/uL (ref 1.7–7.7)

## 2012-01-21 LAB — CBC
HCT: 40.4 % (ref 36.0–46.0)
MCHC: 32.7 g/dL (ref 30.0–36.0)
RDW: 14.1 % (ref 11.5–15.5)

## 2012-01-21 LAB — URINALYSIS, ROUTINE W REFLEX MICROSCOPIC
Bilirubin Urine: NEGATIVE
Ketones, ur: NEGATIVE mg/dL
Nitrite: NEGATIVE
Protein, ur: NEGATIVE mg/dL
Urobilinogen, UA: 0.2 mg/dL (ref 0.0–1.0)
pH: 7 (ref 5.0–8.0)

## 2012-01-21 LAB — BASIC METABOLIC PANEL
BUN: 15 mg/dL (ref 6–23)
Calcium: 9.4 mg/dL (ref 8.4–10.5)
Chloride: 103 mEq/L (ref 96–112)
Creatinine, Ser: 1.07 mg/dL (ref 0.50–1.10)
GFR calc Af Amer: 61 mL/min — ABNORMAL LOW (ref >90)

## 2012-01-21 LAB — LIPID PANEL
Cholesterol: 182 mg/dL (ref 0–200)
Total CHOL/HDL Ratio: 3.5 RATIO

## 2012-01-21 LAB — PROTIME-INR: INR: 1.08 (ref 0.00–1.49)

## 2012-01-21 LAB — APTT: aPTT: 30 seconds (ref 24–37)

## 2012-01-21 MED ORDER — SODIUM CHLORIDE 0.9 % IV BOLUS (SEPSIS)
500.0000 mL | Freq: Once | INTRAVENOUS | Status: AC
  Administered 2012-01-21: 500 mL via INTRAVENOUS

## 2012-01-21 NOTE — ED Provider Notes (Signed)
Pt in CDU on TIA protocol.  She presented to ED c/o LLE weakness w/ fall and leaning to left side x 2d.  Has also had memory impairment and confusion.  CT showed subacute ischemia or metastatic lesions of frontal lobe.  Pt denies having any symptoms in ED.   On repeat exam, NAD, VSS, A&O, CN 3-12 intact and no sensory or motor deficits.  Pt will have dopplers, 2D echo and MRI/MRA in am.  11:49 PM   Otilio Miu, PA 01/21/12 2350

## 2012-01-21 NOTE — ED Notes (Signed)
Per PA, pt does not have to be NPO since she passed swallow screen.

## 2012-01-21 NOTE — ED Notes (Signed)
Pt. Stated, My legs are getting weak when I walk since Wed.

## 2012-01-21 NOTE — ED Provider Notes (Signed)
History     CSN: [id number]  Arrival date & time 01/21/12  1352   First MD Initiated Contact with Patient 01/21/12 1651      Chief Complaint  Patient presents with  . Extremity Weakness    (Consider location/radiation/quality/duration/timing/severity/associated sxs/prior treatment) Patient is a 66 y.o. female presenting with extremity weakness.  Extremity Weakness This is a new problem. The current episode started in the past 7 days. The problem occurs intermittently. The problem has been gradually worsening. Associated symptoms include coughing, vomiting and weakness. Pertinent negatives include no abdominal pain, change in bowel habit, chest pain, chills, diaphoresis, fever, headaches, joint swelling, nausea, sore throat, swollen glands, urinary symptoms or vertigo. The symptoms are aggravated by coughing and walking.   66 year old female coming in with complaints of left lower extremity weakness 2 days ago that made her fall. States that the next night she had problems ambulating and Leaning to the left side. Husband states that she is just not right. States that on the way here to the hospital she was reading every sign to him. I noticed that the patient was reading a book as we were discussing her complaints. She has a past medical history of breast cancer rheumatic heart disease, asthma, GERD, hypertension. She also had a closed mitral valvulectomy by finger fracture in 1962 at [facility]. She has never smoked. Alert and oriented presently. Active and plays golf frequently.  Retired Engineer, site.  PCP with  health care.   Past Medical History  Diagnosis Date  . Rheumatic heart disease     mild AS/AI and mild MS/trivial MR  . Breast carcinoma 1995    1995  . Asthma   . Gastroesophageal reflux disease     +gastoparesis  . Hyperlipidemia   . Hypertension     Past Surgical History  Procedure Date  . Mastectomy 1995  . Mitral valve surgery [facility], closed mitral  valvulotomy by finger fracture  . Tubal ligation 1973    Family History  Problem Relation Age of Onset  . Heart disease Brother   . Rheum arthritis Maternal Grandmother   . Asthma Maternal Grandfather     History  Substance Use Topics  . Smoking status: Never Smoker   . Smokeless tobacco: Never Used  . Alcohol Use: No    OB History    Grav Para Term Preterm Abortions TAB SAB Ect Mult Living                  Review of Systems  Constitutional: Negative for fever, chills and diaphoresis.  HENT: Negative.  Negative for sore throat.   Eyes: Negative.   Respiratory: Positive for cough.   Cardiovascular: Negative.  Negative for chest pain.  Gastrointestinal: Positive for vomiting. Negative for nausea, abdominal pain and change in bowel habit.  Musculoskeletal: Positive for extremity weakness. Negative for joint swelling.  Neurological: Positive for weakness. Negative for vertigo and headaches.  Psychiatric/Behavioral: Negative.   All other systems reviewed and are negative.    Allergies  Review of patient's allergies indicates no known allergies.  Home Medications   Current Outpatient Rx  Name Route Sig Dispense Refill  . ATORVASTATIN CALCIUM 10 MG PO TABS Oral Take 10 mg by mouth daily.      . BUPROPION HCL ER (XL) 150 MG PO TB24 Oral Take 150 mg by mouth daily.      Marland Kitchen CALCIUM CITRATE-VITAMIN D 200-250 MG-UNIT PO TABS Oral Take 2 tablets by mouth daily.      Marland Kitchen  VITAMIN D PO Oral Take 1 capsule by mouth daily.    Marland Kitchen LEVOTHYROXINE SODIUM 88 MCG PO TABS Oral Take 88 mcg by mouth as directed.     Marland Kitchen LOSARTAN POTASSIUM 100 MG PO TABS Oral Take 50 mg by mouth daily.      . CHOICE DM FIBER-BURST PO Oral Take by mouth.    . OMEPRAZOLE 20 MG PO CPDR Oral Take 20 mg by mouth 2 (two) times daily with a meal.      . VITAMINS C E 500-400 MG-UNIT PO CAPS Oral Take 1 capsule by mouth daily.        BP 130/58  Pulse 76  Temp(Src) 98.7 F (37.1 C) (Oral)  Resp 16  SpO2  98%  Physical Exam  Nursing note and vitals reviewed. Constitutional: She is oriented to person, place, and time. She appears well-developed and well-nourished.  HENT:  Head: Normocephalic and atraumatic.  Eyes: Conjunctivae and EOM are normal. Pupils are equal, round, and reactive to light.  Neck: Normal range of motion. Neck supple.  Cardiovascular: Normal rate, regular rhythm, normal heart sounds and intact distal pulses.  Exam reveals no gallop and no friction rub.   No murmur heard. Pulmonary/Chest: Effort normal and breath sounds normal.  Abdominal: Soft. Bowel sounds are normal.  Musculoskeletal: Normal range of motion. She exhibits no edema and no tenderness.  Neurological: She is alert and oriented to person, place, and time. She has normal strength and normal reflexes. A sensory deficit is present. GCS eye subscore is 4. GCS verbal subscore is 5. GCS motor subscore is 6.       Pt had a LLE drift all other neuro in tact presently.  Skin: Skin is warm and dry.  Psychiatric: She has a normal mood and affect.    ED Course  Procedures (including critical care time)   Labs Reviewed  URINALYSIS, ROUTINE W REFLEX MICROSCOPIC  BASIC METABOLIC PANEL  DIFFERENTIAL   No results found.   No diagnosis found.    MDM  2115 Patient will go to CDU on TIA protocol. Attempted to get a MRI of the brain now but x-ray cannot do it. Report given to [facility]. CT of the head shows possible metastases to the brain. Patient will have Doppler studies and MRI in the morning prior to dispo.    Labs Reviewed  BASIC METABOLIC PANEL - Abnormal; Notable for the following:    Glucose, Bld 116 (*)    GFR calc non Af Amer 52 (*)    GFR calc Af Amer 61 (*)    All other components within normal limits  LIPID PANEL - Abnormal; Notable for the following:    LDL Cholesterol 101 (*)    All other components within normal limits  URINALYSIS, ROUTINE W REFLEX MICROSCOPIC  CBC  DIFFERENTIAL  PROTIME-INR   APTT  HEMOGLOBIN A1C          Remi Haggard, NP 01/21/12 2254  1am  Report given to Dr. Oletta Lamas. Pt in CDU awaiting MRI on TIA protocol.  Neuro in tact presently.   1300 Spoke with Dr Lyman Speller who will consult for admission for acute embolic stroke of the R ACA distal A3 segment.  Hospitalists team 6 Dr. Lavera Guise will admit to 3000 tele bed.  Pt continues to have a LLE drift and personality changes.  Husband and daughter at bedside.    Remi Haggard, NP 01/22/12 1358

## 2012-01-21 NOTE — ED Notes (Signed)
Patient passed swallow screen.

## 2012-01-21 NOTE — ED Notes (Signed)
Pt in bed eating food brought to her by her husband, pt informed she should not be eating, pts husband stated, "I was told she could eat & that the test would not be affected by food." pt observed eating without difficulty, pt has head control with control of saliva, pt is not coughing & is maintaining adequate oxygenation while eating, [place], [place] informed

## 2012-01-21 NOTE — ED Notes (Signed)
I gave the patient a warm blanket. 

## 2012-01-21 NOTE — ED Notes (Signed)
Called X-ray re: MR of brain, informed MR is only available for emergent scans after 19:00, called Okey Dupre, PA, [place], PA at bedside at this time

## 2012-01-21 NOTE — ED Notes (Signed)
I gave the patient a cup of ice and a diet coke. 

## 2012-01-22 ENCOUNTER — Other Ambulatory Visit (HOSPITAL_COMMUNITY): Payer: Medicare Other

## 2012-01-22 ENCOUNTER — Encounter (HOSPITAL_COMMUNITY): Payer: Self-pay | Admitting: *Deleted

## 2012-01-22 ENCOUNTER — Observation Stay (HOSPITAL_COMMUNITY): Payer: Medicare Other

## 2012-01-22 DIAGNOSIS — R0989 Other specified symptoms and signs involving the circulatory and respiratory systems: Secondary | ICD-10-CM

## 2012-01-22 DIAGNOSIS — I359 Nonrheumatic aortic valve disorder, unspecified: Secondary | ICD-10-CM

## 2012-01-22 LAB — HEMOGLOBIN A1C
Hgb A1c MFr Bld: 6.6 % — ABNORMAL HIGH (ref <5.7)
Mean Plasma Glucose: 143 mg/dL — ABNORMAL HIGH (ref <117)

## 2012-01-22 MED ORDER — VITAMIN D3 25 MCG (1000 UNIT) PO TABS
1000.0000 [IU] | ORAL_TABLET | Freq: Every day | ORAL | Status: DC
  Administered 2012-01-22 – 2012-01-24 (×3): 1000 [IU] via ORAL
  Filled 2012-01-22 (×4): qty 1

## 2012-01-22 MED ORDER — ATORVASTATIN CALCIUM 20 MG PO TABS
20.0000 mg | ORAL_TABLET | Freq: Every day | ORAL | Status: DC
  Administered 2012-01-22 – 2012-01-24 (×3): 20 mg via ORAL
  Filled 2012-01-22 (×3): qty 1

## 2012-01-22 MED ORDER — CALCIUM CITRATE-VITAMIN D 200-250 MG-UNIT PO TABS: 2.0000 | ORAL_TABLET | Freq: Every day | ORAL | Status: DC

## 2012-01-22 MED ORDER — LEVOTHYROXINE SODIUM 88 MCG PO TABS: 88.0000 ug | ORAL_TABLET | Freq: Every day | ORAL | Status: DC

## 2012-01-22 MED ORDER — ENOXAPARIN SODIUM 40 MG/0.4ML ~~LOC~~ SOLN
40.0000 mg | SUBCUTANEOUS | Status: DC
  Administered 2012-01-22 – 2012-01-23 (×2): 40 mg via SUBCUTANEOUS
  Filled 2012-01-22 (×3): qty 0.4

## 2012-01-22 MED ORDER — ASPIRIN EC 325 MG PO TBEC
325.0000 mg | DELAYED_RELEASE_TABLET | Freq: Every day | ORAL | Status: DC
  Administered 2012-01-22 – 2012-01-24 (×3): 325 mg via ORAL
  Filled 2012-01-22 (×3): qty 1

## 2012-01-22 MED ORDER — LEVOTHYROXINE SODIUM 88 MCG PO TABS
176.0000 ug | ORAL_TABLET | ORAL | Status: DC
  Administered 2012-01-24: 176 ug via ORAL
  Filled 2012-01-22 (×2): qty 2

## 2012-01-22 MED ORDER — SODIUM CHLORIDE 0.9 % IJ SOLN
3.0000 mL | Freq: Two times a day (BID) | INTRAMUSCULAR | Status: DC
  Administered 2012-01-22 – 2012-01-24 (×3): 3 mL via INTRAVENOUS

## 2012-01-22 MED ORDER — LEVOTHYROXINE SODIUM 88 MCG PO TABS
88.0000 ug | ORAL_TABLET | ORAL | Status: DC
  Administered 2012-01-22 – 2012-01-23 (×2): 88 ug via ORAL
  Filled 2012-01-22 (×4): qty 1

## 2012-01-22 MED ORDER — BUPROPION HCL ER (XL) 150 MG PO TB24
150.0000 mg | ORAL_TABLET | Freq: Every day | ORAL | Status: DC
  Administered 2012-01-22 – 2012-01-24 (×3): 150 mg via ORAL
  Filled 2012-01-22 (×3): qty 1

## 2012-01-22 MED ORDER — LEVOTHYROXINE SODIUM 88 MCG PO TABS: 88.0000 ug | ORAL_TABLET | ORAL | Status: DC

## 2012-01-22 MED ORDER — ATORVASTATIN CALCIUM 10 MG PO TABS: 10.0000 mg | ORAL_TABLET | Freq: Every day | ORAL | Status: DC

## 2012-01-22 MED ORDER — BENZONATATE 100 MG PO CAPS
100.0000 mg | ORAL_CAPSULE | Freq: Once | ORAL | Status: AC
  Administered 2012-01-22: 100 mg via ORAL
  Filled 2012-01-22: qty 1

## 2012-01-22 MED ORDER — GADOBENATE DIMEGLUMINE 529 MG/ML IV SOLN
17.0000 mL | Freq: Once | INTRAVENOUS | Status: AC | PRN
  Administered 2012-01-22: 17 mL via INTRAVENOUS

## 2012-01-22 MED ORDER — PANTOPRAZOLE SODIUM 40 MG PO TBEC
40.0000 mg | DELAYED_RELEASE_TABLET | Freq: Every day | ORAL | Status: DC
  Administered 2012-01-22 – 2012-01-23 (×2): 40 mg via ORAL
  Filled 2012-01-22 (×2): qty 1

## 2012-01-22 MED ORDER — CALCIUM CARBONATE-VITAMIN D 500-200 MG-UNIT PO TABS
1.0000 | ORAL_TABLET | Freq: Every day | ORAL | Status: DC
  Administered 2012-01-22 – 2012-01-24 (×3): 1 via ORAL
  Filled 2012-01-22 (×3): qty 1

## 2012-01-22 NOTE — H&P (Signed)
PCP:   Carylon Perches, MD, MD   Chief Complaint:  Dysarthria and balance problems  HPI: 66 yo woman with hx of rheumatic heart ds, admitted today at the request of the stroke service for new onset CVA. Seen in the ED by stroke team and found to be outside of the stroke window due to symptom onset >96 hrs prior to admission. She currently feels fine and has no complains.    Review of Systems:  The patient denies anorexia, fever, weight loss,, vision loss, decreased hearing, hoarseness, chest pain, syncope, dyspnea on exertion, peripheral edema,  hemoptysis, abdominal pain, melena, hematochezia, severe indigestion/heartburn, hematuria, incontinence, genital sores, muscle weakness, suspicious skin lesions, transient blindness, depression, unusual weight change,   Past Medical History: Past Medical History  Diagnosis Date  . Rheumatic heart disease     mild AS/AI and mild MS/trivial MR  . Breast carcinoma 1995    1995  . Asthma   . Gastroesophageal reflux disease     +gastoparesis  . Hyperlipidemia   . Hypertension    Past Surgical History  Procedure Date  . Mastectomy 1995  . Mitral valve surgery [facility], closed mitral valvulotomy by finger fracture  . Tubal ligation 1973    Medications: Prior to Admission medications   Medication Sig Start Date End Date Taking? Authorizing Provider  atorvastatin (LIPITOR) 10 MG tablet Take 10 mg by mouth daily.     Yes Historical Provider, MD  buPROPion (WELLBUTRIN XL) 150 MG 24 hr tablet Take 150 mg by mouth daily.     Yes Historical Provider, MD  Calcium Citrate-Vitamin D (CITRACAL PETITES/VITAMIN D) 200-250 MG-UNIT TABS Take 2 tablets by mouth daily.     Yes Historical Provider, MD  Cholecalciferol (VITAMIN D PO) Take 1 capsule by mouth daily.   Yes Historical Provider, MD  levothyroxine (SYNTHROID, LEVOTHROID) 88 MCG tablet Take 88 mcg by mouth as directed.    Yes Historical Provider, MD  losartan (COZAAR) 100 MG tablet Take 50 mg by  mouth daily.     Yes Historical Provider, MD  Nutritional Supplements (CHOICE DM FIBER-BURST PO) Take by mouth.   Yes Historical Provider, MD  omeprazole (PRILOSEC) 20 MG capsule Take 20 mg by mouth 2 (two) times daily with a meal.     Yes Historical Provider, MD  Vitamins C E 500-400 MG-UNIT CAPS Take 1 capsule by mouth daily.     Yes Historical Provider, MD    Allergies:  No Known Allergies  Social History:  reports that she has never smoked. She has never used smokeless tobacco. She reports that she does not drink alcohol or use illicit drugs.  History   Social History Narrative   Married with childrenNo regular exercise     Family History: Family History  Problem Relation Age of Onset  . Heart disease Brother   . Rheum arthritis Maternal Grandmother   . Asthma Maternal Grandfather     Physical Exam: Filed Vitals:   01/22/12 0653 01/22/12 1200 01/22/12 1418 01/22/12 1452  BP: 150/65 152/84 154/68 129/60  Pulse: 88 87 78 61  Temp:  98.2 F (36.8 C) 98.2 F (36.8 C) 98.6 F (37 C)  TempSrc:  Oral Oral   Resp: 18   20  SpO2: 99% 98% 96% 98%   General appearance: alert, cooperative, appears stated age and mild distress Head: Normocephalic, without obvious abnormality, atraumatic Eyes: conjunctivae/corneas clear. PERRL, EOM's intact. Fundi benign. Nose: Nares normal. Septum midline. Mucosa normal. No drainage  or sinus tenderness. Throat: lips, mucosa, and tongue normal; teeth and gums normal Neck: no adenopathy, no carotid bruit, no JVD, supple, symmetrical, trachea midline and thyroid not enlarged, symmetric, no tenderness/mass/nodules Resp: clear to auscultation bilaterally Chest wall: no tenderness Cardio: regular rate and rhythm, S1, S2 normal, 3/6 systolic murmur,  GI: soft, non-tender; bowel sounds normal; no masses,  no organomegaly Extremities: extremities normal, atraumatic, no cyanosis or edema Pulses: 2+ and symmetric Skin: Skin color, texture, turgor  normal. No rashes or lesions Neurologic: Alert and oriented X 3, normal strength and tone. Normal symmetric reflexes. Normal coordination and gait   Labs on Admission:   Basename 01/21/12 1718  NA 138  K 3.9  CL 103  CO2 23  GLUCOSE 116*  BUN 15  CREATININE 1.07  CALCIUM 9.4  MG --  PHOS --   No results found for this basename: AST:2,ALT:2,ALKPHOS:2,BILITOT:2,PROT:2,ALBUMIN:2 in the last 72 hours No results found for this basename: LIPASE:2,AMYLASE:2 in the last 72 hours  Basename 01/21/12 1909  WBC 7.7  NEUTROABS 5.5  HGB 13.2  HCT 40.4  MCV 88.6  PLT 185   No results found for this basename: CKTOTAL:3,CKMB:3,CKMBINDEX:3,TROPONINI:3 in the last 72 hours No results found for this basename: TSH,T4TOTAL,FREET3,T3FREE,THYROIDAB in the last 72 hours No results found for this basename: VITAMINB12:2,FOLATE:2,FERRITIN:2,TIBC:2,IRON:2,RETICCTPCT:2 in the last 72 hours  Radiological Exams on Admission: Dg Chest 2 View  01/21/2012  *RADIOLOGY REPORT*  Clinical Data: Shortness of breath for 6 months.  Symptoms increasing for 2 months.  History of prior surgery in 1962.  CHEST - 2 VIEW  Comparison: 04/03/2009  Findings: Heart is enlarged.  There is prominence of interstitial markings, some of which may be chronic.  However, there is probable interstitial edema.  No overt alveolar edema identified.  More focal patchy density is seen at the left lung base, raising question of early infiltrate.  Surgical clips overlie the left hilar region and right axilla.  IMPRESSION:  1.  Cardiomegaly and probable interstitial edema. 2.  Possible early infiltrate in the left lower lobe.  Original Report Authenticated By: Patterson Hammersmith, M.D.   Ct Head Wo Contrast  01/21/2012  *RADIOLOGY REPORT*  Clinical Data: Left-sided weakness.  History breast cancer.  CT HEAD WITHOUT CONTRAST  Technique:  Contiguous axial images were obtained from the base of the skull through the vertex without contrast.   Comparison: MRI of 06/09/2009.  Head CT of 11/17/2006.  Findings: Bone windows demonstrate hypoplastic frontal sinuses. Clear mastoid air cells.  Soft tissue windows demonstrate mild low density in the periventricular white matter likely related to small vessel disease.There is a probable remote lacunar infarct in the right parietal occipital white matter on image 17 of series 2.  Hypoattenuation within the right frontal lobe white matter.  No definite to cortical involvement.  No significant mass effect. Example images 15 - 20.  No hemorrhage, hydrocephalus, intra-axial, or extra-axial fluid collection.  IMPRESSION: 1.  Hypoattenuation within the right frontal lobe.  Differential considerations include subacute ischemia or (given history of breast cancer and suggestion of vasogenic edema) metastatic disease.  Correlate with clinical symptoms.  If further imaging characterization is desired, pre and post contrast brain MR should be considered.  2.  Small vessel ischemic change.  Original Report Authenticated By: Consuello Bossier, M.D.   Mr Laqueta Jean Wo Contrast  01/22/2012  *RADIOLOGY REPORT*  Clinical Data:  Left lower extremity weakness beginning 2 days ago. History of breast cancer.  History of closed mitral  valvulotomy. Suspected but ill-defined cerebrovascular disease.  Hypertension.  MRI HEAD WITHOUT AND WITH CONTRAST MRA HEAD WITHOUT CONTRAST  Technique:  Multiplanar, multiecho pulse sequences of the brain and surrounding structures were obtained without and with intravenous contrast.  Angiographic images of the head were obtained using MRA technique without contrast.  Contrast: 17mL MULTIHANCE GADOBENATE DIMEGLUMINE 529 MG/ML IV SOLN  Comparison:   CT head 01/21/2012.  MRI HEAD WITHOUT AND WITH CONTRAST  Findings: There is a moderate sized area of restricted diffusion and cytotoxic edema affecting the parasagittal medial right frontal lobe and adjacent subcortical white matter without hemorrhage, correlating  with the observed abnormality on CT scan.   There is no mass lesion.  No evidence for vasogenic edema.  No midline shift. No other areas of acute infarction.  Mild age related atrophy.  Mild chronic microvascular ischemic change affects the periventricular and subcortical white matter. Remote subcentimeter infarcts affect the left inferior cerebellum. There is no midline shift.  Post infusion, there is no abnormal enhancement of the brain or meninges. No areas suspicious for intracranial metastatic disease are observed.  Normal pituitary and cerebellar tonsils.  No acute sinus or mastoid fluid.  Negative orbits.  IMPRESSION: Acute right ACA territory infarct affecting the medial right frontal lobe and white matter.  No hemorrhage or midline shift.  Mild atrophy and chronic microvascular ischemic change.  MRA HEAD  Findings: The internal carotid arteries as well as the basilar artery, are all widely patent.  Vertebrals codominant.  There is no proximal ACA or MCA stenosis.  Both posterior cerebral arteries are widely patent.  At the bifurcation of the right ACA into the pericallosal and callosomarginal branches, there is an abrupt occlusion correlating with the observed pattern of infarction. This is an unusual location for intracranial atherosclerotic change; the observed pattern of infarction is likely embolic.  There is no visible aneurysm.  No visible cerebellar branch occlusion.  IMPRESSION: Probable embolic occlusion right ACA distal A3 segment, at the pericallosal/callosomarginal bifurcation.  See comments above.  No proximal intracranial atherosclerotic disease.  Original Report Authenticated By: Elsie Stain, M.D.   Mr Mra Head/brain Wo Cm  01/22/2012  *RADIOLOGY REPORT*  Clinical Data:  Left lower extremity weakness beginning 2 days ago. History of breast cancer.  History of closed mitral valvulotomy. Suspected but ill-defined cerebrovascular disease.  Hypertension.  MRI HEAD WITHOUT AND WITH CONTRAST  MRA HEAD WITHOUT CONTRAST  Technique:  Multiplanar, multiecho pulse sequences of the brain and surrounding structures were obtained without and with intravenous contrast.  Angiographic images of the head were obtained using MRA technique without contrast.  Contrast: 17mL MULTIHANCE GADOBENATE DIMEGLUMINE 529 MG/ML IV SOLN  Comparison:   CT head 01/21/2012.  MRI HEAD WITHOUT AND WITH CONTRAST  Findings: There is a moderate sized area of restricted diffusion and cytotoxic edema affecting the parasagittal medial right frontal lobe and adjacent subcortical white matter without hemorrhage, correlating with the observed abnormality on CT scan.   There is no mass lesion.  No evidence for vasogenic edema.  No midline shift. No other areas of acute infarction.  Mild age related atrophy.  Mild chronic microvascular ischemic change affects the periventricular and subcortical white matter. Remote subcentimeter infarcts affect the left inferior cerebellum. There is no midline shift.  Post infusion, there is no abnormal enhancement of the brain or meninges. No areas suspicious for intracranial metastatic disease are observed.  Normal pituitary and cerebellar tonsils.  No acute sinus or mastoid fluid.  Negative orbits.  IMPRESSION: Acute right ACA territory infarct affecting the medial right frontal lobe and white matter.  No hemorrhage or midline shift.  Mild atrophy and chronic microvascular ischemic change.  MRA HEAD  Findings: The internal carotid arteries as well as the basilar artery, are all widely patent.  Vertebrals codominant.  There is no proximal ACA or MCA stenosis.  Both posterior cerebral arteries are widely patent.  At the bifurcation of the right ACA into the pericallosal and callosomarginal branches, there is an abrupt occlusion correlating with the observed pattern of infarction. This is an unusual location for intracranial atherosclerotic change; the observed pattern of infarction is likely embolic.  There is  no visible aneurysm.  No visible cerebellar branch occlusion.  IMPRESSION: Probable embolic occlusion right ACA distal A3 segment, at the pericallosal/callosomarginal bifurcation.  See comments above.  No proximal intracranial atherosclerotic disease.  Original Report Authenticated By: Elsie Stain, M.D.   2Decho Study Conclusions  - Left ventricle: The cavity size was normal. Wall thickness was normal. The estimated ejection fraction was 60%. Wall motion was normal; there were no regional wall motion abnormalities. - Aortic valve: Thickened leaflets. Moderate AS. Mild/moderate AI - Mitral valve: Rheumatic MS. The MS is moderate. - Left atrium: The atrium was moderately dilated. - Impressions: I have carefully compared this study to the study of 10/2011. There is NSR. There is rheumatic MS, which increases the chance of embolic potential. There is no obvious shunt. The measurements are complicated. I have summarized the grades of the valvular abnormalities. They are similar to the study of 10/2011. No cardiac source of embolism was identified, but cannot be ruled out on the basis of this examination. Impressions:  - I have carefully compared this study to the study of 10/2011. There is NSR. There is rheumatic MS, which increases the chance of embolic potential. There is no obvious shunt. The measurements are complicated. I have summarized the grades of the valvular abnormalities. They are similar to the study of 10/2011. No cardiac source of embolism was identified, but cannot be ruled out on the basis of this examination.   Assessment/Plan Present on Admission:  .CVA (cerebral vascular accident) .Hyperlipidemia .Hypertension .Gastroesophageal reflux disease .DM type 2 (diabetes mellitus, type 2)   1. CVA - plan for TEE tomorrow to further understand etiology . Continue aspirin for now. Further w/u per neuro protocol 2. DM 2 - diet and exercise 3. HTn - for now will hold ARB  and monitor closely     [name] 01/22/2012, 4:55 PM

## 2012-01-22 NOTE — ED Provider Notes (Signed)
  Medical screening examination/treatment/procedure(s) were conducted as a shared visit with non-physician practitioner(s) and myself.  I personally evaluated the patient during the encounter  Pt with stuttering onset of left leg weakness and gait disturbance tending to walk to the left.  Exam shows her awake and alert, no dysphasia, no focal neurologic abnormality.  Admit to CDU for TIA workup.   Carleene Cooper III, MD 01/22/12 [phone]

## 2012-01-22 NOTE — ED Provider Notes (Signed)
Medical screening examination/treatment/procedure(s) were performed by non-physician practitioner and as supervising physician I was immediately available for consultation/collaboration.   Carleene Cooper III, MD 01/22/12 [phone]

## 2012-01-22 NOTE — Progress Notes (Signed)
01/22/12 1419  Discharge Planning  Type of Residence Private residence  Living Arrangements Spouse/significant other  Home Care Services No  Support Systems Spouse/significant other  Do you have any problems obtaining your medications? No  Once you are discharged, how will you get to your follow-up appointment? Family;Self  Expected Discharge Date 01/25/12  Case Management Consult Needed No  Social Work Consult Needed No

## 2012-01-22 NOTE — ED Notes (Signed)
Patient transported to MRI 

## 2012-01-22 NOTE — ED Notes (Signed)
Pt is currently ambulating around the nurses station without difficulty. Pt is A&O, speech is clear.

## 2012-01-22 NOTE — ED Notes (Signed)
Neurology PA in seeing pt at present time.

## 2012-01-22 NOTE — ED Notes (Signed)
Called service response center for heart healthy diet tray.

## 2012-01-22 NOTE — ED Notes (Signed)
Pt c/o cough, advised [place], [place].

## 2012-01-22 NOTE — Progress Notes (Signed)
01/22/12 1420  OTHER  CSW Follow Up Status No follow-up needed (Consult unit based LCSW if needs arise)

## 2012-01-22 NOTE — ED Notes (Signed)
Transported to Heart/Vascular

## 2012-01-22 NOTE — Consult Note (Signed)
Referring Physician: Lavera Guise    Chief Complaint: LLE weakness, gait itability and confusion  HPI: [name] is an 66 y.o. female who presented to [facility] ED yesterday afternoon with c/o gait ataxia and confusion.  Sx began about 5-7 days ago and have progressively gotten worse.  2 days ago, she had a fall due to leg weakness.  Friday night, she kept repeating questions and was disoriented.    Brought to ER by family because she wasn't getting better.  Head CT revealed subacute ischemia vs frontal lobe mets.   Neuro consult requested.  Does not take aspirin at home.  LSN: 01/15/12 tPA Given: no, outside tPA window. Interval: Baseline (04/20 1700) Level of Consciousness (1a. ): Alert, keenly responsive (04/20 1700) LOC Questions (1b. ): Answers both questions correctly (04/20 1700) LOC Commands (1c. ): Performs both tasks correctly (04/20 1700) Best Gaze (2. ): Normal (04/20 1700) Visual (3. ): No visual loss (04/20 1700) Facial Palsy (4. ): Normal symmetrical movements (04/20 1700) Motor Arm, Left (5a. ): No drift (04/20 1700) Motor Arm, Right (5b. ): No drift (04/20 1700) Motor Leg, Left (6a. ): Drift (04/20 1700) Motor Leg, Right (6b. ): No drift (04/20 1700) Limb Ataxia (7. ): Absent (04/20 1700) Sensory (8. ): Normal, no sensory loss (04/20 1700) Best Language (9. ): No aphasia (04/20 1700) Dysarthria (10. ): Normal (04/20 1700) Inattention/Extinction: No Abnormality (04/20 1700) Total: 1  (04/20 1700)  Past Medical History  Diagnosis Date  . Rheumatic heart disease     mild AS/AI and mild MS/trivial MR  . Breast carcinoma 1995    1995  . Asthma   . Gastroesophageal reflux disease     +gastoparesis  . Hyperlipidemia   . Hypertension     Past Surgical History  Procedure Date  . Mastectomy 1995  . Mitral valve surgery [facility], closed mitral valvulotomy by finger fracture  . Tubal ligation 1973    Family History  Problem Relation Age of Onset  . Heart  disease Brother   . Rheum arthritis Maternal Grandmother   . Asthma Maternal Grandfather    Social History: No tobacco, ETOH, IDU, Married, mother, retired Editor, commissioning.  Allergies: No Known Allergies  ROS: No chest pain, SOB, N/V.  Occasional palpitations.  No syncope or presyncope.  Fall 2 days ago occurred due to leg weakness, no LOC or injury.  No HA, VA change or difficulty swallowing.  Speech has remained clear.     Medications: Scheduled:   . benzonatate  100 mg Oral Once  . sodium chloride  500 mL Intravenous Once   Physical Examination: Blood pressure 150/65, pulse 88, temperature 98.1 F (36.7 C), temperature source Oral, resp. rate 18, SpO2 99.00%. Telemetry: SR  Neurologic Examination: Mental Status: Alert, oriented, thought content appropriate.  Speech fluent without evidence of aphasia. Able to follow 3 step commands without difficulty. Naming and repetition intact. Cranial Nerves: II- Visual fields grossly intact. III/IV/VI-Extraocular movements intact.  Pupils reactive bilaterally. V/VII-Smile symmetric VIII-grossly intact IX/X-normal gag XI-bilateral shoulder shrug XII-midline tongue extension Motor: 5/5 bilaterally with normal tone and bulk Sensory:  light touch intact throughout, bilaterally Deep Tendon Reflexes: 2+UE bilaterally, absent patellar's and achilles Plantars: Downgoing bilaterally Cerebellar: Normal finger-to-nose, normal rapid alternating movements and normal heel-to-shin test.  Normal gait and station.  Basic Metabolic Panel:  Lab 01/21/12 0454  NA 138  K 3.9  CL 103  CO2 23  GLUCOSE 116*  BUN 15  CREATININE 1.07  CALCIUM 9.4  MG --  PHOS --   CBC:  Lab 01/21/12 1909  WBC 7.7  NEUTROABS 5.5  HGB 13.2  HCT 40.4  MCV 88.6  PLT 185   Hemoglobin A1C:  Lab 01/21/12 2148  HGBA1C 6.6*   Fasting Lipid Panel:  Lab 01/21/12 2147  CHOL 182  HDL 52  LDLCALC 101*  TRIG 147  CHOLHDL 3.5  LDLDIRECT --    Coagulation:  Lab 01/21/12 2148  LABPROT 14.2  INR 1.08   Urinalysis:  Lab 01/21/12 1838  COLORURINE YELLOW  LABSPEC 1.005  PHURINE 7.0  GLUCOSEU NEGATIVE  HGBUR NEGATIVE  BILIRUBINUR NEGATIVE  KETONESUR NEGATIVE  PROTEINUR NEGATIVE  UROBILINOGEN 0.2  NITRITE NEGATIVE  LEUKOCYTESUR NEGATIVE   Misc. Labs  01/21/2012 CHEST - 2 VIEW  Comparison: 04/03/2009  Findings: Heart is enlarged.  There is prominence of interstitial markings, some of which may be chronic.  However, there is probable interstitial edema.  No overt alveolar edema identified.  More focal patchy density is seen at the left lung base, raising question of early infiltrate.  Surgical clips overlie the left hilar region and right axilla.  IMPRESSION:  1.  Cardiomegaly and probable interstitial edema. 2.  Possible early infiltrate in the left lower lobe.   Patterson Hammersmith, M.D.   01/21/2012 CT HEAD WITHOUT CONTRAST   Findings: Bone windows demonstrate hypoplastic frontal sinuses. Clear mastoid air cells.  Soft tissue windows demonstrate mild low density in the periventricular white matter likely related to small vessel disease.There is a probable remote lacunar infarct in the right parietal occipital white matter on image 17 of series 2.  Hypoattenuation within the right frontal lobe white matter.  No definite to cortical involvement.  No significant mass effect. Example images 15 - 20.  No hemorrhage, hydrocephalus, intra-axial, or extra-axial fluid collection.  IMPRESSION: 1.  Hypoattenuation within the right frontal lobe.  Differential considerations include subacute ischemia or (given history of breast cancer and suggestion of vasogenic edema) metastatic disease.  Correlate with clinical symptoms.  If further imaging characterization is desired, pre and post contrast brain MR should be considered.  2.  Small vessel ischemic change.   Consuello Bossier, M.D.   01/22/2012    MRI HEAD WITHOUT  Findings: There is a moderate  sized area of restricted diffusion and cytotoxic edema affecting the parasagittal medial right frontal lobe and adjacent subcortical white matter without hemorrhage, correlating with the observed abnormality on CT scan.   There is no mass lesion.  No evidence for vasogenic edema.  No midline shift. No other areas of acute infarction.  Mild age related atrophy.  Mild chronic microvascular ischemic change affects the periventricular and subcortical white matter. Remote subcentimeter infarcts affect the left inferior cerebellum. There is no midline shift.  Post infusion, there is no abnormal enhancement of the brain or meninges. No areas suspicious for intracranial metastatic disease are observed.  Normal pituitary and cerebellar tonsils.  No acute sinus or mastoid fluid.  Negative orbits.  IMPRESSION: Acute right ACA territory infarct affecting the medial right frontal lobe and white matter.  No hemorrhage or midline shift.  Mild atrophy and chronic microvascular ischemic change.   Elsie Stain, M.D  01/22/12 MRA HEAD  Findings: The internal carotid arteries as well as the basilar artery, are all widely patent.  Vertebrals codominant.  There is no proximal ACA or MCA stenosis.  Both posterior cerebral arteries are widely patent.  At the bifurcation of  the right ACA into the pericallosal and callosomarginal branches, there is an abrupt occlusion correlating with the observed pattern of infarction. This is an unusual location for intracranial atherosclerotic change; the observed pattern of infarction is likely embolic.  There is no visible aneurysm.  No visible cerebellar branch occlusion.  IMPRESSION: Probable embolic occlusion right ACA distal A3 segment, at the pericallosal/callosomarginal bifurcation.  See comments above.  No proximal intracranial atherosclerotic disease.   Elsie Stain, M.D.   01/22/12 Carotid Dopplers Preliminary report: There was no ICA stenosis. Vertebral artery flow is  antegrade.  01/22/12 2 D Echo: Left ventricle: The cavity size was normal. Wall thickness was normal. The estimated ejection fraction was 60%. Wall motion was normal; there were no regional wall motion abnormalities. - Aortic valve: Thickened leaflets. Moderate AS. Mild/moderate AI - Mitral valve: Rheumatic MS. The MS is moderate. - Left atrium: The atrium was moderately dilated. - Impressions: I have carefully compared this study to the study of 10/2011. There is NSR. There is rheumatic MS, which increases the chance of embolic potential. There is no obvious shunt. The measurements are complicated. I have summarized the grades of the valvular abnormalities. They are similar to the study of 10/2011. No cardiac source of embolism was identified, but cannot be ruled out on the basis of this examination.  Assessment: 66 y.o. female with acute right ACA territory infarct affecting the medial right frontal lobe and white matter.  No hemorrhage or midline shift. Probable embolic occlusion right ACA distal A3 segment.   2D echo reveals Rheumatic mitral stenosis, which can increase embolic potential.  Patient had gait ataxia disorientation which are somewhat improved.  Passed stroke swallow screen.  HTN- accelerated (SBP 110 at home per family) Hyperlipidemia- LDL 101; not at goal >80 Diabetes Mellitus- A1C 6.6- new onset-  H/O Breast CA Rheumatic Heart Disease.  Stroke Risk Factors - HTN, hyperlipidemia, rheumatic valvular disease  Plan: 1. PT consult, OT consult, Speech consult 2. Will need TEE ( cardiologist is [facility])- rec cards consult. 3. Risk factor modification-   appears to have new adult onset diabetes, rec. Diabetes, nutrition counseling  Increase Lipitor to 20 mg daily 4. Prophylactic therapy-ASA 325 mg daily. 5. Telemetry monitoring.  6. SCD's  Thank you for allowing [place] to assist in the management of this patient with you. Patient will be seen and examined by Dr. Lyman Speller.  Marya Fossa PA-C [facility] [phone] 01/22/2012, 1:22 PM

## 2012-01-22 NOTE — Progress Notes (Signed)
  Echocardiogram 2D Echocardiogram has been performed.  Lucillie Kiesel F 01/22/2012, 9:01 AM

## 2012-01-22 NOTE — Progress Notes (Signed)
VASCULAR LAB PRELIMINARY  PRELIMINARY  PRELIMINARY  PRELIMINARY  Carotid Dopplers completed.    Preliminary report:  There was no ICA stenosis.  Vertebral artery flow is antegrade.  Sherren Kerns Theresa, 01/22/2012, 8:02 AM

## 2012-01-22 NOTE — ED Notes (Signed)
Pt still in vascular lab having @2D -Echo and carotid dopplers done. Will call MRI when pt returns to department.

## 2012-01-23 ENCOUNTER — Encounter (HOSPITAL_COMMUNITY): Payer: Self-pay | Admitting: Physician Assistant

## 2012-01-23 DIAGNOSIS — E039 Hypothyroidism, unspecified: Secondary | ICD-10-CM

## 2012-01-23 DIAGNOSIS — I099 Rheumatic heart disease, unspecified: Secondary | ICD-10-CM

## 2012-01-23 DIAGNOSIS — I6350 Cerebral infarction due to unspecified occlusion or stenosis of unspecified cerebral artery: Principal | ICD-10-CM

## 2012-01-23 MED ORDER — STROKE: EARLY STAGES OF RECOVERY BOOK
Freq: Once | Status: AC
  Administered 2012-01-23: 07:00:00
  Filled 2012-01-23: qty 1

## 2012-01-23 MED ORDER — FUROSEMIDE 40 MG PO TABS
40.0000 mg | ORAL_TABLET | Freq: Every day | ORAL | Status: DC
  Administered 2012-01-23: 40 mg via ORAL
  Filled 2012-01-23 (×3): qty 1

## 2012-01-23 MED ORDER — POTASSIUM CHLORIDE CRYS ER 10 MEQ PO TBCR
10.0000 meq | EXTENDED_RELEASE_TABLET | Freq: Every day | ORAL | Status: DC
  Administered 2012-01-23 – 2012-01-24 (×2): 10 meq via ORAL
  Filled 2012-01-23 (×3): qty 1

## 2012-01-23 NOTE — Consult Note (Signed)
Cardiology Consult Note   [name] ID: [name] MRN: [id number], DOB/AGE: June 22, 1946   Admit date: 01/21/2012 Date of Consult: 01/23/2012  Primary Physician: Carylon Perches, MD, MD Primary Cardiologist: [place] Bing, MD  Pt. Profile: [name] is a 66yo female with PMHx significant for rheumatic heart disease (moderate AS, mild-moderate AI, moderate MS; s/p closed mitral valvulotomy in 1962), type 2 DM (newly diagnosed this admission), HTN, HL and history of breast cancer (s/p lumpectomy- 1995) who was admitted to [facility] with CVA.   Reason for consult: evaluation/management of MS in Sara setting of CVA  Problem List: Past Medical History  Diagnosis Date  . Rheumatic heart disease     mild AS/AI and mild MS/trivial MR  . Breast carcinoma 1995    1995  . Asthma   . Gastroesophageal reflux disease     +gastoparesis  . Hyperlipidemia   . Hypertension   . Heart murmur   . Stroke   . Chronic kidney disease   . Depression   . H/O cardiac catheterization 1993    Past Surgical History  Procedure Date  . Mitral valve surgery [facility], closed mitral valvulotomy by finger fracture  . Tubal ligation 1973  . Mastectomy 1995    lumpectomy  . Dilation and curettage of uterus      Allergies: No Known Allergies  HPI:   [name] was seen by Dr. Dietrich Pates in Sara office in 02/13. At that time, [name] reported baseline, unchanged class II dyspnea on exertion. It was noted that there may have been progression of her valvular abnormalities, but intervention was not warranted at that time given her stability of symptoms.   She states that on Wednesday, she began experiencing "leg heaviness" and left leg weakness causing her to fall. This improved, however, Sara following day, [name] reports going to a sporting event and experiencing imbalance and unconsciously leaning and walking leftward. Additionally, her husband notes a decrease in [name]'s  awareness and abnormal facial expressions. Aside from her recent complaints, [name] reports no change in her baseline reduced activity level. She is markedly short of breath reliably after climbing two flights of stairs. She denies recent chest pain, lightheadedness, palpitations, syncope, diarrhea, fevers, chills. She reports recent vomiting and chronic nonproductive cough.   In Sara ED, U/A negative. BMET, CBC WNL. CXR revealed cardiomegaly and probable interstitial edema, possible early infiltrate in LLL. CT head revealed subacute R ACA hypoattenuation with a differential of subacute ischemia vs frontal lobe metastases given [name]'s history of breast cancer. [name]'s was subsequently admitted by internal medicine.   MRI/MRA head revealed probable embolic occlusion of R ACA. Carotid dopplers revealed mild intimal thickening bilaterally, no ICA stenosis and antegrade vertebral artery flow. 2D echocardiogram revealed LVEF 60%, no WMAs and when compared to a prior study in 10/2011, confirms rheumatic MS, however valvular abnormality grades are similar. MV: mean flow gradient- 10 mmHg, peak- 26 mmHg; valve area 0.81cm^2. No cardiac source was identified but could not be ruled out (full details below). Neurology was consulted. [name] was started on full-dose ASA and statin given hyperlipidemia on lipid panel. Additionally, [name]'s Hgb A1C was elevated warranting new diagnosis of type 2 DM.   Home Medications: Prior to Admission medications   Medication Sig Start Date End Date Taking? Authorizing Provider  atorvastatin (LIPITOR) 10 MG tablet Take 10 mg by mouth daily.     Yes Historical Provider, MD  buPROPion (  WELLBUTRIN XL) 150 MG 24 hr tablet Take 150 mg by mouth daily.     Yes Historical Provider, MD  Calcium Citrate-Vitamin D (CITRACAL PETITES/VITAMIN D) 200-250 MG-UNIT TABS Take 2 tablets by mouth daily.     Yes Historical Provider, MD  Cholecalciferol (VITAMIN D PO) Take 1  capsule by mouth daily.   Yes Historical Provider, MD  levothyroxine (SYNTHROID, LEVOTHROID) 88 MCG tablet Take 88 mcg by mouth daily. 1 tablet daily, except 2 tablets on Sunday   Yes Historical Provider, MD  losartan (COZAAR) 100 MG tablet Take 50 mg by mouth daily.     Yes Historical Provider, MD  Nutritional Supplements (CHOICE DM FIBER-BURST PO) Take by mouth.   Yes Historical Provider, MD  omeprazole (PRILOSEC) 20 MG capsule Take 20 mg by mouth 2 (two) times daily with a meal.     Yes Historical Provider, MD  Vitamins C E 500-400 MG-UNIT CAPS Take 1 capsule by mouth daily.     Yes Historical Provider, MD    Inpatient Medications:     .  stroke: mapping our early stages of recovery book   Does not apply Once  . aspirin EC  325 mg Oral Daily  . atorvastatin  20 mg Oral q1800  . buPROPion  150 mg Oral Daily  . calcium-vitamin D  1 tablet Oral Daily  . cholecalciferol  1,000 Units Oral Daily  . enoxaparin  40 mg Subcutaneous Q24H  . levothyroxine  176 mcg Oral Custom  . levothyroxine  88 mcg Oral Custom  . pantoprazole  40 mg Oral Q1200  . sodium chloride  3 mL Intravenous Q12H  . DISCONTD: atorvastatin  10 mg Oral Daily  . DISCONTD: Calcium Citrate-Vitamin D  2 tablet Oral Daily  . DISCONTD: levothyroxine  88 mcg Oral UD  . DISCONTD: levothyroxine  88 mcg Oral Daily   Prescriptions prior to admission  Medication Sig Dispense Refill  . atorvastatin (LIPITOR) 10 MG tablet Take 10 mg by mouth daily.        Marland Kitchen buPROPion (WELLBUTRIN XL) 150 MG 24 hr tablet Take 150 mg by mouth daily.        . Calcium Citrate-Vitamin D (CITRACAL PETITES/VITAMIN D) 200-250 MG-UNIT TABS Take 2 tablets by mouth daily.        . Cholecalciferol (VITAMIN D PO) Take 1 capsule by mouth daily.      Marland Kitchen levothyroxine (SYNTHROID, LEVOTHROID) 88 MCG tablet Take 88 mcg by mouth daily. 1 tablet daily, except 2 tablets on Sunday      . losartan (COZAAR) 100 MG tablet Take 50 mg by mouth daily.        . Nutritional  Supplements (CHOICE DM FIBER-BURST PO) Take by mouth.      Marland Kitchen omeprazole (PRILOSEC) 20 MG capsule Take 20 mg by mouth 2 (two) times daily with a meal.        . Vitamins C E 500-400 MG-UNIT CAPS Take 1 capsule by mouth daily.          Family History  Problem Relation Age of Onset  . Heart disease Brother   . Rheum arthritis Maternal Grandmother   . Asthma Maternal Grandfather      History   Social History  . Marital Status: Married    Spouse Name: N/A    Number of Children: N/A  . Years of Education: N/A   Occupational History  . Retired Runner, broadcasting/film/video    Social History Main Topics  . Smoking status: Never Smoker   .  Smokeless tobacco: Never Used  . Alcohol Use: No  . Drug Use: No  . Sexually Active: Not on file   Other Topics Concern  . Not on file   Social History Narrative   Married with childrenNo regular exercise     Review of Systems: General: negative for chills, fever, night sweats or weight changes.  Cardiovascular: positive for dyspnea on exertion, negative for chest pain, edema, orthopnea, palpitations, paroxysmal nocturnal dyspnea Dermatological: negative for rash Respiratory: positivetive for chronic, nonproductive cough and wheezing Urologic: negative for hematuria Abdominal: positive for n/v, negative for diarrhea, bright red blood per rectum, melena, or hematemesis Neurologic: positive for weakness, incoordination and imbalance, negative for visual changes, syncope, or dizziness All other systems reviewed and are otherwise negative except as noted above.  Physical Exam: Blood pressure 150/70, pulse 62, temperature 98.1 F (36.7 C), temperature source Oral, resp. rate 18, height 5\' 4"  (1.626 m), weight 78.019 kg (172 lb), SpO2 97.00%.    General: Anxious, well developed, well nourished, in no acute distress. Head:  Normocephalic, atraumatic, sclera non-icteric, no xanthomas, nares are without discharge.  Neck: Negative for carotid bruits. JVD not  elevated. Lungs: Reduced breath sounds LLL, clear bilaterally to auscultation without wheezes, rales, or rhonchi. Breathing is unlabored. Heart: RRR with S1 S2. No murmurs, rubs, or gallops appreciated. Abdomen: Soft, non-tender, non-distended with normoactive bowel sounds. No hepatomegaly. No rebound/guarding. No obvious abdominal masses. Msk:  Strength and tone- RLE > LLE  Extremities: No clubbing, cyanosis or edema.  Distal pedal pulses are 2+ and equal bilaterally. Neuro: Alert and oriented X 3. Moves all extremities spontaneously. Psych:  Responds to questions appropriately with a normal affect.  Labs: Recent Labs  [facility] 01/21/12 1909   WBC 7.7   HGB 13.2   HCT 40.4   MCV 88.6   PLT 185   Lab 01/21/12 1718  NA 138  K 3.9  CL 103  CO2 23  BUN 15  CREATININE 1.07  CALCIUM 9.4  PROT --  BILITOT --  ALKPHOS --  ALT --  AST --  AMYLASE --  LIPASE --  GLUCOSE 116*   Recent Labs  Basename 01/21/12 2148   HGBA1C 6.6*   Recent Labs  [facility] 01/21/12 2147   CHOL 182   HDL 52   LDLCALC 101*   TRIG 147   CHOLHDL 3.5   LDLDIRECT --   Radiology/Studies: Dg Chest 2 View  01/21/2012  *RADIOLOGY REPORT*  Clinical Data: Shortness of breath for 6 months.  Symptoms increasing for 2 months.  History of prior surgery in 1962.  CHEST - 2 VIEW  Comparison: 04/03/2009  Findings: Heart is enlarged.  There is prominence of interstitial markings, some of which may be chronic.  However, there is probable interstitial edema.  No overt alveolar edema identified.  More focal patchy density is seen at Sara left lung base, raising question of early infiltrate.  Surgical clips overlie Sara left hilar region and right axilla.  IMPRESSION:  1.  Cardiomegaly and probable interstitial edema. 2.  Possible early infiltrate in Sara left lower lobe.  Original Report Authenticated By: Patterson Hammersmith, M.D.   Ct Head Wo Contrast  01/21/2012  *RADIOLOGY REPORT*  Clinical Data: Left-sided weakness.   History breast cancer.  CT HEAD WITHOUT CONTRAST  Technique:  Contiguous axial images were obtained from Sara base of Sara skull through Sara vertex without contrast.  Comparison: MRI of 06/09/2009.  Head CT of 11/17/2006.  Findings: Bone windows demonstrate  hypoplastic frontal sinuses. Clear mastoid air cells.  Soft tissue windows demonstrate mild low density in Sara periventricular white matter likely related to small vessel disease.There is a probable remote lacunar infarct in Sara right parietal occipital white matter on image 17 of series 2.  Hypoattenuation within Sara right frontal lobe white matter.  No definite to cortical involvement.  No significant mass effect. Example images 15 - 20.  No hemorrhage, hydrocephalus, intra-axial, or extra-axial fluid collection.  IMPRESSION: 1.  Hypoattenuation within Sara right frontal lobe.  Differential considerations include subacute ischemia or (given history of breast cancer and suggestion of vasogenic edema) metastatic disease.  Correlate with clinical symptoms.  If further imaging characterization is desired, pre and post contrast brain MR should be considered.  2.  Small vessel ischemic change.  Original Report Authenticated By: Consuello Bossier, M.D.   Mr Laqueta Jean Wo Contrast  01/22/2012  *RADIOLOGY REPORT*  Clinical Data:  Left lower extremity weakness beginning 2 days ago. History of breast cancer.  History of closed mitral valvulotomy. Suspected but ill-defined cerebrovascular disease.  Hypertension.  MRI HEAD WITHOUT AND WITH CONTRAST MRA HEAD WITHOUT CONTRAST  Technique:  Multiplanar, multiecho pulse sequences of Sara brain and surrounding structures were obtained without and with intravenous contrast.  Angiographic images of Sara head were obtained using MRA technique without contrast.  Contrast: 17mL MULTIHANCE GADOBENATE DIMEGLUMINE 529 MG/ML IV SOLN  Comparison:   CT head 01/21/2012.  MRI HEAD WITHOUT AND WITH CONTRAST  Findings: There is a moderate sized area of  restricted diffusion and cytotoxic edema affecting Sara parasagittal medial right frontal lobe and adjacent subcortical white matter without hemorrhage, correlating with Sara observed abnormality on CT scan.   There is no mass lesion.  No evidence for vasogenic edema.  No midline shift. No other areas of acute infarction.  Mild age related atrophy.  Mild chronic microvascular ischemic change affects Sara periventricular and subcortical white matter. Remote subcentimeter infarcts affect Sara left inferior cerebellum. There is no midline shift.  Post infusion, there is no abnormal enhancement of Sara brain or meninges. No areas suspicious for intracranial metastatic disease are observed.  Normal pituitary and cerebellar tonsils.  No acute sinus or mastoid fluid.  Negative orbits.  IMPRESSION: Acute right ACA territory infarct affecting Sara medial right frontal lobe and white matter.  No hemorrhage or midline shift.  Mild atrophy and chronic microvascular ischemic change.  MRA HEAD  Findings: Sara internal carotid arteries as well as Sara basilar artery, are all widely patent.  Vertebrals codominant.  There is no proximal ACA or MCA stenosis.  Both posterior cerebral arteries are widely patent.  At Sara bifurcation of Sara right ACA into Sara pericallosal and callosomarginal branches, there is an abrupt occlusion correlating with Sara observed pattern of infarction. This is an unusual location for intracranial atherosclerotic change; Sara observed pattern of infarction is likely embolic.  There is no visible aneurysm.  No visible cerebellar branch occlusion.  IMPRESSION: Probable embolic occlusion right ACA distal A3 segment, at Sara pericallosal/callosomarginal bifurcation.  See comments above.  No proximal intracranial atherosclerotic disease.  Original Report Authenticated By: Elsie Stain, M.D.   Mr Mra Head/brain Wo Cm  01/22/2012  *RADIOLOGY REPORT*  Clinical Data:  Left lower extremity weakness beginning 2 days ago.  History of breast cancer.  History of closed mitral valvulotomy. Suspected but ill-defined cerebrovascular disease.  Hypertension.  MRI HEAD WITHOUT AND WITH CONTRAST MRA HEAD WITHOUT CONTRAST  Technique:  Multiplanar, multiecho pulse sequences  of Sara brain and surrounding structures were obtained without and with intravenous contrast.  Angiographic images of Sara head were obtained using MRA technique without contrast.  Contrast: 17mL MULTIHANCE GADOBENATE DIMEGLUMINE 529 MG/ML IV SOLN  Comparison:   CT head 01/21/2012.  MRI HEAD WITHOUT AND WITH CONTRAST  Findings: There is a moderate sized area of restricted diffusion and cytotoxic edema affecting Sara parasagittal medial right frontal lobe and adjacent subcortical white matter without hemorrhage, correlating with Sara observed abnormality on CT scan.   There is no mass lesion.  No evidence for vasogenic edema.  No midline shift. No other areas of acute infarction.  Mild age related atrophy.  Mild chronic microvascular ischemic change affects Sara periventricular and subcortical white matter. Remote subcentimeter infarcts affect Sara left inferior cerebellum. There is no midline shift.  Post infusion, there is no abnormal enhancement of Sara brain or meninges. No areas suspicious for intracranial metastatic disease are observed.  Normal pituitary and cerebellar tonsils.  No acute sinus or mastoid fluid.  Negative orbits.  IMPRESSION: Acute right ACA territory infarct affecting Sara medial right frontal lobe and white matter.  No hemorrhage or midline shift.  Mild atrophy and chronic microvascular ischemic change.  MRA HEAD  Findings: Sara internal carotid arteries as well as Sara basilar artery, are all widely patent.  Vertebrals codominant.  There is no proximal ACA or MCA stenosis.  Both posterior cerebral arteries are widely patent.  At Sara bifurcation of Sara right ACA into Sara pericallosal and callosomarginal branches, there is an abrupt occlusion correlating with Sara  observed pattern of infarction. This is an unusual location for intracranial atherosclerotic change; Sara observed pattern of infarction is likely embolic.  There is no visible aneurysm.  No visible cerebellar branch occlusion.  IMPRESSION: Probable embolic occlusion right ACA distal A3 segment, at Sara pericallosal/callosomarginal bifurcation.  See comments above.  No proximal intracranial atherosclerotic disease.  Original Report Authenticated By: Elsie Stain, M.D.   01/23/12 Study Conclusions  - Left ventricle: Sara cavity size was normal. Wall thickness was normal. Sara estimated ejection fraction was 60%. Wall motion was normal; there were no regional wall motion abnormalities. - Aortic valve: Thickened leaflets. Moderate AS. Mild/moderate AI - Mitral valve: Rheumatic MS. The MS is moderate. - Left atrium: Sara atrium was moderately dilated. - Impressions: I have carefully compared this study to Sara study of 10/2011. There is NSR. There is rheumatic MS, which increases Sara chance of embolic potential. There is no obvious shunt. Sara measurements are complicated. I have summarized Sara grades of Sara valvular abnormalities. They are similar to Sara study of 10/2011. No cardiac source of embolism was identified, but cannot be ruled out on Sara basis of this examination. Impressions:  - I have carefully compared this study to Sara study of 10/2011. There is NSR. There is rheumatic MS, which increases Sara chance of embolic potential. There is no obvious shunt. Sara measurements are complicated. I have summarized Sara grades of Sara valvular abnormalities. They are similar to Sara study of 10/2011. No cardiac source of embolism was identified, but cannot be ruled out on Sara basis of this examination. Transthoracic echocardiography. M-mode, complete 2D, spectral Doppler, and color Doppler. Height: Height: 157.5cm. Height: 62in. Weight: Weight: 78.2kg. Weight: 172lb. Body mass index: BMI: 31.5kg/m^2.  Body surface area: BSA: 1.65m^2. Blood pressure: 150/65. [name] status: Observation. Location: Echo laboratory.  ------------------------------------------------------------  ------------------------------------------------------------ Left ventricle: Sara cavity size was normal. Wall thickness was normal. Sara estimated ejection fraction was  60%. Wall motion was normal; there were no regional wall motion abnormalities.  ------------------------------------------------------------ Aortic valve: Thickened leaflets. Moderate AS. Mild/moderate AI Doppler: VTI ratio of LVOT to aortic valve: 0.23. Valve area: 0.71cm^2(VTI). Indexed valve area: 0.38cm^2/m^2 (VTI). Peak velocity ratio of LVOT to aortic valve: 0.22. Valve area: 0.69cm^2 (Vmax). Indexed valve area: 0.37cm^2/m^2 (Vmax). Mean gradient: 20mm Hg (S). Peak gradient: 35mm Hg (S).  ------------------------------------------------------------ Aorta: Aortic root: Sara aortic root was normal in size.  ------------------------------------------------------------ Mitral valve: Rheumatic MS. The MS is moderate. Doppler: Valve area by continuity equation (using LVOT flow): 0.81cm^2. Indexed valve area by continuity equation (using LVOT flow): 0.43cm^2/m^2. Mean gradient: 10mm Hg (D). Peak gradient: 26mm Hg (D).  ------------------------------------------------------------ Left atrium: Sara atrium was moderately dilated.  ------------------------------------------------------------ Right ventricle: Sara cavity size was normal. Systolic function was normal.  ------------------------------------------------------------ Pulmonic valve: Sara valve appears to be grossly normal. Doppler: No significant regurgitation.  ------------------------------------------------------------ Tricuspid valve: Structurally normal valve. Leaflet separation was normal. Doppler: Transvalvular velocity was within Sara normal range. No  regurgitation.  ------------------------------------------------------------ Right atrium: Sara atrium was normal in size.  ------------------------------------------------------------ Pericardium: There was no pericardial effusion.  ------------------------------------------------------------  2D measurements Normal Doppler measurements Normal Left ventricle Left ventricle LVID ED, 40 mm 43-52 Ea, lat 4.68 cm/s ------ chord, ann, tiss PLAX DP LVID ES, 27 mm 23-38 E/Ea, lat 46.7 ------ chord, ann, tiss 9 PLAX DP FS, chord, 33 % >29 Ea, med 6.04 cm/s ------ PLAX ann, tiss LVPW, ED 11 mm ------ DP IVS/LVPW 0.91 <1.3 E/Ea, med 36.2 ------ ratio, ED ann, tiss 6 Ventricular septum DP IVS, ED 10 mm ------ LVOT LVOT Peak vel, 65.3 cm/s ------ Diam, S 20 mm ------ S Area 3.14 cm^2 ------ VTI, S 17.1 cm ------ Aorta Aortic valve Root diam, 24 mm ------ Peak vel, 296 cm/s ------ ED S Left atrium Mean vel, 211 cm/s ------ AP dim 45 mm ------ S AP dim 2.39 cm/m^2 <2.2 VTI, S 75.7 cm ------ index Mean 20 mm Hg ------ Right ventricle gradient, RVID ED, 33 mm 19-38 S PLAX Peak 35 mm Hg ------ gradient, S VTI ratio 0.23 ------ LVOT/AV Area, VTI 0.71 cm^2 ------ Area index 0.38 cm^2/m ------ (VTI) ^2 Peak vel 0.22 ------ ratio, LVOT/AV Area, Vmax 0.69 cm^2 ------ Area index 0.37 cm^2/m ------ (Vmax) ^2 Regurg 325 cm/s ------ vel, ED Regurg PHT 392 ms ------ Regurg 42 mm Hg ------ gradient, ED Mitral valve Peak E vel 219 cm/s ------ Peak A vel 141 cm/s ------ Mean vel, 149 cm/s ------ D Decelerati 440 ms 150-23 on time 0 Mean 10 mm Hg ------ gradient, D Peak 26 mm Hg ------ gradient, D Peak E/A 1.6 ------ ratio Area 0.81 cm^2 ------ (LVOT) continuity Area index 0.43 cm^2/m ------ (LVOT ^2 cont) Annulus 66.7 cm ------ VTI  ASSESSMENT AND PLAN:   [name] is a 66yo female with PMHx significant for rheumatic heart disease (moderate AS, mild-moderate AI,  moderate MS; s/p closed mitral valvulotomy in 1962), type 2 DM (newly diagnosed this admission), HTN, HL, hypothyroidism and history of breast cancer (s/p lumpectomy- 1995) who was admitted to [facility] with CVA. She was seen in Sara ED by Sara stroke team, and found to be outside of Sara window for treatment (>96 hours).  1. Subacute CVA- documented on noncontrast CT and MRI/MRA head post-admission. [name] reported imbalance, confusion and incoordination last week, which worsened and prompted her to present to Sara hospital. This appears to be embolic as noted on MRA.  Given her history of rheumatic heart disease, we have been asked to evaluate. [name] is scheduled for TEE today. Neurology following and assuming treatment management. Full-dose ASA was added. Lipitor was increased to 20mg  given mildly elevated LDL on lipid panel (101).   - Will start Lasix 40mg  daily given vascular congestion on exam   2. Rheumatic mitral stenosis- symptomatically, [name] has remained stable. She has unchanged DOE which reliably occurs after climbing two flights of stairs. Despite this, she has been able to remain active and go golfing with her husband. On echo, MV grades suggest borderline severe MS (mean gradient- 10 mmHg, peak- 26 mmHg; valve area- 0.81cm^2). Unfortunately, given Sara findings on MRA head, there may be question of an embolic source to her CVA.   - Agree with TEE for further thrombus evaluation  - Will discuss with MD further indication for invasive intervention    3. Hyperlipidemia- LDL goal now < 100 given recent diabetes diagnosis  - Agree with up-titration of Lipitor  4. Type 2 DM- newly diagnosed. A1C 6.6. Per primary team.   5. HTN- would benefit from further control.   Signed, R. Hurman Horn, PA-C 01/23/2012, 11:37 AM  Attending Note:   [name] was seen and examined.  Agree with assessment and plan as noted above.  [name] is a pleasant WF with known  mitral stenosis.  She was admitted with a CVA - which has almost completely resolved.   She has had moderate - severe MS by echo and is followed by Dr. Dietrich Pates.  She plays golf regularly.    On exam, she is in NSR.  Has clear lungs.  Heart - RR, soft systolic murmur.  I did not hear a diastolic murmur with Sara ambient noise in Sara room.  No leg edema.  She is scheduled for a TEE tomorrow.    There is some mention that Sara CVA may have been caused by head mets.  If she is found to have a thrombi or "smoke" we would consider coumadin therapy.   Following.  Vesta Mixer, Montez Hageman., MD, [facility] 01/23/2012, 12:35 PM

## 2012-01-23 NOTE — Progress Notes (Signed)
Stroke Team Progress Note  HISTORY [name] is an 66 y.o. female who presented to [facility] ED 01/21/2012 afternoon with c/o gait ataxia and confusion. Sx began about 5-7 days ago and have progressively gotten worse. 2 days ago, she had a fall due to leg weakness. Friday night, she kept repeating questions and was disoriented. Brought to ER by family because she wasn't getting better. Head CT revealed subacute ischemia vs frontal lobe mets. Neuro consult requested. Does not take aspirin at home. Patient was not a TPA candidate secondary to delay in arrival. She was admitted for further evaluation and treatment.  SUBJECTIVE Her husbnad is at the bedside.  Overall she feels her condition is completely resolved. She wants her TEE today; she has been told the schedule is full and it has been delayed until Tues.  OBJECTIVE Most recent Vital Signs: Filed Vitals:   01/22/12 1452 01/22/12 2000 01/23/12 0001 01/23/12 0400  BP: 129/60 128/61 118/55 156/72  Pulse: 61 66 76 74  Temp: 98.6 F (37 C) 98 F (36.7 C) 98.5 F (36.9 C) 97.9 F (36.6 C)  TempSrc:   Oral   Resp: 20 18 16 16   Height:   5\' 4"  (1.626 m)   Weight:   78.019 kg (172 lb)   SpO2: 98% 99% 100% 100%   CBG (last 3)  No results found for this basename: GLUCAP:3 in the last 72 hours Intake/Output from previous day:    IV Fluid Intake:     MEDICATIONS    .  stroke: mapping our early stages of recovery book   Does not apply Once  . aspirin EC  325 mg Oral Daily  . atorvastatin  20 mg Oral q1800  . buPROPion  150 mg Oral Daily  . calcium-vitamin D  1 tablet Oral Daily  . cholecalciferol  1,000 Units Oral Daily  . enoxaparin  40 mg Subcutaneous Q24H  . levothyroxine  176 mcg Oral Custom  . levothyroxine  88 mcg Oral Custom  . pantoprazole  40 mg Oral Q1200  . sodium chloride  3 mL Intravenous Q12H  . DISCONTD: atorvastatin  10 mg Oral Daily  . DISCONTD: Calcium Citrate-Vitamin D  2 tablet Oral Daily  . DISCONTD:  levothyroxine  88 mcg Oral UD  . DISCONTD: levothyroxine  88 mcg Oral Daily   PRN:  gadobenate dimeglumine  Diet:  NPO  Activity:   Bathroom privileges with assistance DVT Prophylaxis:  Lovenox 40 mg sq daily   CLINICALLY SIGNIFICANT STUDIES CBC    Component Value Date/Time   WBC 7.7 01/21/2012 1909   RBC 4.56 01/21/2012 1909   HGB 13.2 01/21/2012 1909   HCT 40.4 01/21/2012 1909   PLT 185 01/21/2012 1909   MCV 88.6 01/21/2012 1909   MCH 28.9 01/21/2012 1909   MCHC 32.7 01/21/2012 1909   RDW 14.1 01/21/2012 1909   LYMPHSABS 1.4 01/21/2012 1909   MONOABS 0.6 01/21/2012 1909   EOSABS 0.2 01/21/2012 1909   BASOSABS 0.0 01/21/2012 1909   CMP    Component Value Date/Time   NA 138 01/21/2012 1718   K 3.9 01/21/2012 1718   CL 103 01/21/2012 1718   CO2 23 01/21/2012 1718   GLUCOSE 116* 01/21/2012 1718   BUN 15 01/21/2012 1718   CREATININE 1.07 01/21/2012 1718   CALCIUM 9.4 01/21/2012 1718   GFRNONAA 52* 01/21/2012 1718   GFRAA 61* 01/21/2012 1718   COAGS Lab Results  Component Value Date   INR 1.08 01/21/2012  Lipid Panel    Component Value Date/Time   CHOL 182 01/21/2012 2147   TRIG 147 01/21/2012 2147   HDL 52 01/21/2012 2147   CHOLHDL 3.5 01/21/2012 2147   VLDL 29 01/21/2012 2147   LDLCALC 101* 01/21/2012 2147   HgbA1C  Lab Results  Component Value Date   HGBA1C 6.6* 01/21/2012   Cardiac Panel (last 3 results) No results found for this basename: CKTOTAL:3,CKMB:3,TROPONINI:3,RELINDX:3 in the last 72 hours Urinalysis    Component Value Date/Time   COLORURINE YELLOW 01/21/2012 1838   APPEARANCEUR CLEAR 01/21/2012 1838   LABSPEC 1.005 01/21/2012 1838   PHURINE 7.0 01/21/2012 1838   GLUCOSEU NEGATIVE 01/21/2012 1838   HGBUR NEGATIVE 01/21/2012 1838   BILIRUBINUR NEGATIVE 01/21/2012 1838   KETONESUR NEGATIVE 01/21/2012 1838   PROTEINUR NEGATIVE 01/21/2012 1838   UROBILINOGEN 0.2 01/21/2012 1838   NITRITE NEGATIVE 01/21/2012 1838   LEUKOCYTESUR NEGATIVE 01/21/2012 1838   Urine Drug Screen  No  results found for this basename: labopia, cocainscrnur, labbenz, amphetmu, thcu, labbarb    Alcohol Level No results found for this basename: eth   CT of the brain  1.  Hypoattenuation within the right frontal lobe.  Differential considerations include subacute ischemia or (given history of breast cancer and suggestion of vasogenic edema) metastatic disease.  Correlate with clinical symptoms.  If further imaging characterization is desired, pre and post contrast brain MR should be considered.  2.  Small vessel ischemic change.   MRI of the brain  Acute right ACA territory infarct affecting the medial right frontal lobe and white matter.  No hemorrhage or midline shift.  Mild atrophy and chronic microvascular ischemic change.   MRA of the brain   Probable embolic occlusion right ACA distal A3 segment, at the pericallosal/callosomarginal bifurcation.  See comments above.  No proximal intracranial atherosclerotic disease.    2D Echocardiogram  I have carefully compared this study to the study of 10/2011. There is NSR. There is rheumatic MS, which increases the chance of embolic potential. There is no obvious shunt. The measurements are complicated. I have summarized the grades of the valvular abnormalities. They are similar to the study of 10/2011. No cardiac source of embolism was identified, but cannot be ruled out on the basis of this examination.  Carotid Doppler  No internal carotid artery stenosis bilaterally. Vertebrals with antegrade flow bilaterally.   CXR  01/21/2012  1.  Cardiomegaly and probable interstitial edema. 2.  Possible early infiltrate in the left lower lobe.      Physical Exam   Pleasant middle aged caucasian lady not in distress.Awake alert. Afebrile. Head is nontraumatic. Neck is supple without bruit. Hearing is normal. Cardiac exam no murmur or gallop. Lungs are clear to auscultation. Distal pulses are well felt.   Neurologic exam ; Awake  Alert oriented x 3. Normal speech  and language.eye movements full without nystagmus. Face symmetric. Tongue midline. Normal strength, tone, reflexes and coordination except minimal left hip and ankle dorsiflexor weakness.. Normal sensation. Gait steady but broad based.  ASSESSMENT [name] is a 67 y.o. female with a  right ACA territory infarct secondary to embolus, possible cardioembolic etiology. TEE scheduled for tomorrow. On no antiplatelets prior to admission. Now on aspirin 325 mg orally every day for secondary stroke prevention. Patients ataxia and disorientation have recovered completely.  -rheumatic heart disease with MS -breast CA 1995 GERD -hyperlipidemia -hypertension  Hospital day # 2  TREATMENT/PLAN -Continue aspirin 325 mg orally every day for secondary  stroke prevention. -TEE Tues, scheduled with [place] -D/w patient and husband. Joaquin Music, ANP-BC, GNP-BC [facility] Pager: [phone] 01/23/2012 8:24 AM  Dr. Delia Heady, Stroke Center Medical Director, has personally reviewed chart, pertinent data, examined the patient and developed the plan of care.

## 2012-01-23 NOTE — Progress Notes (Signed)
Subjective: No new events Awaiting TEE      Physical Exam: Blood pressure 143/80, pulse 75, temperature 98.5 F (36.9 C), temperature source Oral, resp. rate 18, height 5\' 4"  (1.626 m), weight 78.019 kg (172 lb), SpO2 98.00%. Patient Vitals for the past 24 hrs:  BP Temp Temp src Pulse Resp SpO2 Height Weight  01/23/12 2000 143/80 mmHg 98.5 F (36.9 C) Oral 75  18  98 % - -  01/23/12 1600 132/56 mmHg 98.8 F (37.1 C) Oral 81  18  99 % - -  01/23/12 1200 119/57 mmHg 98.4 F (36.9 C) Oral 76  20  99 % - -  01/23/12 0800 150/70 mmHg 98.1 F (36.7 C) Oral 62  18  97 % - -  01/23/12 0400 156/72 mmHg 97.9 F (36.6 C) - 74  16  100 % - -  01/23/12 0001 118/55 mmHg 98.5 F (36.9 C) Oral 76  16  100 % 5\' 4"  (1.626 m) 78.019 kg (172 lb)      Investigations:  No results found for this or any previous visit (from the past 240 hour(s)).   Basic Metabolic Panel:  Basename 01/21/12 1718  NA 138  K 3.9  CL 103  CO2 23  GLUCOSE 116*  BUN 15  CREATININE 1.07  CALCIUM 9.4  MG --  PHOS --   Liver Function Tests: No results found for this basename: AST:2,ALT:2,ALKPHOS:2,BILITOT:2,PROT:2,ALBUMIN:2 in the last 72 hours   CBC:  Basename 01/21/12 1909  WBC 7.7  NEUTROABS 5.5  HGB 13.2  HCT 40.4  MCV 88.6  PLT 185    Mr Brain W Wo Contrast  01/22/2012  *RADIOLOGY REPORT*  Clinical Data:  Left lower extremity weakness beginning 2 days ago. History of breast cancer.  History of closed mitral valvulotomy. Suspected but ill-defined cerebrovascular disease.  Hypertension.  MRI HEAD WITHOUT AND WITH CONTRAST MRA HEAD WITHOUT CONTRAST  Technique:  Multiplanar, multiecho pulse sequences of the brain and surrounding structures were obtained without and with intravenous contrast.  Angiographic images of the head were obtained using MRA technique without contrast.  Contrast: 17mL MULTIHANCE GADOBENATE DIMEGLUMINE 529 MG/ML IV SOLN  Comparison:   CT head 01/21/2012.  MRI HEAD WITHOUT AND WITH  CONTRAST  Findings: There is a moderate sized area of restricted diffusion and cytotoxic edema affecting the parasagittal medial right frontal lobe and adjacent subcortical white matter without hemorrhage, correlating with the observed abnormality on CT scan.   There is no mass lesion.  No evidence for vasogenic edema.  No midline shift. No other areas of acute infarction.  Mild age related atrophy.  Mild chronic microvascular ischemic change affects the periventricular and subcortical white matter. Remote subcentimeter infarcts affect the left inferior cerebellum. There is no midline shift.  Post infusion, there is no abnormal enhancement of the brain or meninges. No areas suspicious for intracranial metastatic disease are observed.  Normal pituitary and cerebellar tonsils.  No acute sinus or mastoid fluid.  Negative orbits.  IMPRESSION: Acute right ACA territory infarct affecting the medial right frontal lobe and white matter.  No hemorrhage or midline shift.  Mild atrophy and chronic microvascular ischemic change.  MRA HEAD  Findings: The internal carotid arteries as well as the basilar artery, are all widely patent.  Vertebrals codominant.  There is no proximal ACA or MCA stenosis.  Both posterior cerebral arteries are widely patent.  At the bifurcation of the right ACA into the pericallosal and callosomarginal branches, there is an abrupt  occlusion correlating with the observed pattern of infarction. This is an unusual location for intracranial atherosclerotic change; the observed pattern of infarction is likely embolic.  There is no visible aneurysm.  No visible cerebellar branch occlusion.  IMPRESSION: Probable embolic occlusion right ACA distal A3 segment, at the pericallosal/callosomarginal bifurcation.  See comments above.  No proximal intracranial atherosclerotic disease.  Original Report Authenticated By: Elsie Stain, M.D.   Mr Mra Head/brain Wo Cm  01/22/2012  *RADIOLOGY REPORT*  Clinical Data:   Left lower extremity weakness beginning 2 days ago. History of breast cancer.  History of closed mitral valvulotomy. Suspected but ill-defined cerebrovascular disease.  Hypertension.  MRI HEAD WITHOUT AND WITH CONTRAST MRA HEAD WITHOUT CONTRAST  Technique:  Multiplanar, multiecho pulse sequences of the brain and surrounding structures were obtained without and with intravenous contrast.  Angiographic images of the head were obtained using MRA technique without contrast.  Contrast: 17mL MULTIHANCE GADOBENATE DIMEGLUMINE 529 MG/ML IV SOLN  Comparison:   CT head 01/21/2012.  MRI HEAD WITHOUT AND WITH CONTRAST  Findings: There is a moderate sized area of restricted diffusion and cytotoxic edema affecting the parasagittal medial right frontal lobe and adjacent subcortical white matter without hemorrhage, correlating with the observed abnormality on CT scan.   There is no mass lesion.  No evidence for vasogenic edema.  No midline shift. No other areas of acute infarction.  Mild age related atrophy.  Mild chronic microvascular ischemic change affects the periventricular and subcortical white matter. Remote subcentimeter infarcts affect the left inferior cerebellum. There is no midline shift.  Post infusion, there is no abnormal enhancement of the brain or meninges. No areas suspicious for intracranial metastatic disease are observed.  Normal pituitary and cerebellar tonsils.  No acute sinus or mastoid fluid.  Negative orbits.  IMPRESSION: Acute right ACA territory infarct affecting the medial right frontal lobe and white matter.  No hemorrhage or midline shift.  Mild atrophy and chronic microvascular ischemic change.  MRA HEAD  Findings: The internal carotid arteries as well as the basilar artery, are all widely patent.  Vertebrals codominant.  There is no proximal ACA or MCA stenosis.  Both posterior cerebral arteries are widely patent.  At the bifurcation of the right ACA into the pericallosal and callosomarginal  branches, there is an abrupt occlusion correlating with the observed pattern of infarction. This is an unusual location for intracranial atherosclerotic change; the observed pattern of infarction is likely embolic.  There is no visible aneurysm.  No visible cerebellar branch occlusion.  IMPRESSION: Probable embolic occlusion right ACA distal A3 segment, at the pericallosal/callosomarginal bifurcation.  See comments above.  No proximal intracranial atherosclerotic disease.  Original Report Authenticated By: Elsie Stain, M.D.      Medications:  Scheduled:    .  stroke: mapping our early stages of recovery book   Does not apply Once  . aspirin EC  325 mg Oral Daily  . atorvastatin  20 mg Oral q1800  . buPROPion  150 mg Oral Daily  . calcium-vitamin D  1 tablet Oral Daily  . cholecalciferol  1,000 Units Oral Daily  . enoxaparin  40 mg Subcutaneous Q24H  . furosemide  40 mg Oral Daily  . levothyroxine  176 mcg Oral Custom  . levothyroxine  88 mcg Oral Custom  . pantoprazole  40 mg Oral Q1200  . potassium chloride  10 mEq Oral Daily  . sodium chloride  3 mL Intravenous Q12H   Continuous:  PRN:  Impression:  Principal Problem:  *CVA (cerebral vascular accident) Active Problems:  Gastroesophageal reflux disease  Hyperlipidemia  Hypertension  DM type 2 (diabetes mellitus, type 2)  Hypothyroidism     Plan: C/w current treatment No new events       LOS: 2 days   Suleman Gunning, MD Pager: [phone] 01/23/2012, 10:17 PM   pager [phone]

## 2012-01-23 NOTE — Evaluation (Signed)
Speech Language Pathology Evaluation Patient Details Name: [name] MRN: [id number] DOB: [birth year] Today's Date: 01/23/2012  Problem List:  Patient Active Problem List  Diagnoses  . RHEUMATIC HEART DISEASE  . Breast carcinoma  . Asthma  . Gastroesophageal reflux disease  . Hyperlipidemia  . Hypertension  . CVA (cerebral vascular accident)  . DM type 2 (diabetes mellitus, type 2)   Past Medical History:  Past Medical History  Diagnosis Date  . Rheumatic heart disease     mild AS/AI and mild MS/trivial MR  . Breast carcinoma 1995    1995  . Asthma   . Gastroesophageal reflux disease     +gastoparesis  . Hyperlipidemia   . Hypertension   . Heart murmur   . Stroke   . Chronic kidney disease   . Depression   . H/O cardiac catheterization 1993   Past Surgical History:  Past Surgical History  Procedure Date  . Mitral valve surgery [facility], closed mitral valvulotomy by finger fracture  . Tubal ligation 1973  . Mastectomy 1995    lumpectomy  . Dilation and curettage of uterus     SLP Assessment/Plan/Recommendation Patient presents with mild deficits in the areas of memory (short term recall particularly during today's exam), processing, and attention. Per patient and husband, memory deficits are noted at baseline, mainly poor recall of entire life events (ie, trips, the death of patient's mother, basketball games, etc) and that it does not appear to be impacting overall day to day function at this time. Question acute CVAs impact on overall awareness of deficits at this time and while overall function appears intact for ability to return home with husband, would recommend OP SLP services with focus on teaching compensatory strategies to increase functionality with ADLs. Patient and husband in agreement. No further acute SLP needs indicated at this time.   SLP Recommendation/Assessment: All further Speech Lanaguage Pathology  needs can be addressed in the next  venue of care SLP Recommendations Follow up Recommendations: Outpatient SLP  [facility] MA, CCC-SLP [phone]   [name] 01/23/2012, 10:59 AM

## 2012-01-23 NOTE — Progress Notes (Signed)
Utilization Review Completed.[name] T4/22/2013   

## 2012-01-23 NOTE — Evaluation (Signed)
Occupational Therapy Evaluation Patient Details Name: LUVIA ORZECHOWSKI MRN: [id number] DOB: [birth year] Today's Date: 01/23/2012    OT Assessment / Plan / Recommendation Clinical Impression  Pt presents to OT s/p admission with stroke like symptoms.  Pt presenting at baseline with ADL activity with OT.  No further OT needs at this time.    OT Assessment  Patient does not need any further OT services    Follow Up Recommendations  No OT follow up    Equipment Recommendations  None recommended by OT          Pertinent Vitals/Pain No pain    ADL  Grooming: Simulated;Independent Where Assessed - Grooming: Standing at sink Upper Body Bathing: Simulated;Supervision/safety Where Assessed - Upper Body Bathing: Sitting, chair;Unsupported Lower Body Bathing: Simulated;Supervision/safety Where Assessed - Lower Body Bathing: Sit to stand from bed Lower Body Dressing: Simulated;Supervision/safety Where Assessed - Lower Body Dressing: Sit to stand from bed Toilet Transfer: Performed;Supervision/safety Toilet Transfer Method: Proofreader: Regular height toilet Toileting - Clothing Manipulation: Performed;Supervision/safety Where Assessed - Glass blower/designer Manipulation: Standing Toileting - Hygiene: Simulated;Supervision/safety Where Assessed - Toileting Hygiene: Standing Tub/Shower Transfer: Performed;Supervision/safety Tub/Shower Transfer Method: Science writer: Grab bars Ambulation Related to ADLs: Pt doing well. Husband will  provide S initially upon DC.                Prior Functioning  Home Living Lives With: Spouse Available Help at Discharge: Family Type of Home: House Home Access: Stairs to enter [employer] of Steps: 3 Home Layout: Two level;Able to live on main level with bedroom/bathroom Alternate Level Stairs-Number of Steps: Flight Bathroom Shower/Tub: Estate agent: None Prior Function Level of Independence: Independent Driving: Yes Vocation: Retired Musician: No difficulties Dominant Hand: Right    Cognition  Overall Cognitive Status: Appears within functional limits for tasks assessed/performed Arousal/Alertness: Awake/alert Orientation Level: Appears intact for tasks assessed Behavior During Session: [facility] for tasks performed    Extremity/Trunk Assessment Right Upper Extremity Assessment RUE ROM/Strength/Tone: Within functional levels Left Upper Extremity Assessment LUE ROM/Strength/Tone: Within functional levels   Mobility Bed Mobility Bed Mobility: Rolling Right;Right Sidelying to Sit;Sit to Supine Rolling Right: 7: Independent Right Sidelying to Sit: 7: Independent Sit to Supine: 7: Independent Transfers Sit to Stand: 7: Independent;With upper extremity assist;Without upper extremity assist;From bed         End of Session OT - End of Session Activity Tolerance: Patient tolerated treatment well Patient left: in bed;with family/visitor present;with call bell/phone within reach   [facility] [place] 01/23/2012, 12:10 PM

## 2012-01-23 NOTE — Evaluation (Signed)
Physical Therapy Evaluation Patient Details Name: [name] MRN: [id number] DOB: 1946-01-02 Today's Date: 01/23/2012 Time: 0730-0749 PT Time Calculation (min): 19 min  PT Assessment / Plan / Recommendation Clinical Impression  Patient presents with NIHSS 1 with radiological testing revealing an old lacunar infarct and possible embolic occlusion of the right ACA. At present she presents independently with all activites. She prefers a slower gait and has external rotation of bilateral lower extremities/wide base of support which resulted in lower DGI score. She does not require any skilled PT needs. She is very active PTA and plans to return to playing golf as soon as possible. I reviewed the warning signs of stroke with patient and spouse.    PT Assessment  Patent does not need any further PT services    Follow Up Recommendations  No PT follow up    Equipment Recommendations  None recommended by PT    Frequency   N/A   Precautions / Restrictions Precautions Precautions: None   Pertinent Vitals/Pain No reports of pain      Mobility  Bed Mobility Bed Mobility: Rolling Right;Right Sidelying to Sit;Sit to Supine Rolling Right: 7: Independent Right Sidelying to Sit: 7: Independent Sit to Supine: 7: Independent Transfers Transfers: Sit to Stand Sit to Stand: 7: Independent;With upper extremity assist;Without upper extremity assist;From bed Ambulation/Gait Ambulation/Gait Assistance: 7: Independent Ambulation Distance (Feet): 250 Feet Assistive device: None Ambulation/Gait Assistance Details: External rotation of bilateral lower extremities. Gait Pattern: Step-through pattern;Wide base of support Gait velocity: 2.0 feet/second - not indicative of falls Stairs: Yes Stairs Assistance: 7: Independent Stair Management Technique: One rail Left Number of Stairs: 4  Modified Rankin (Stroke Patients Only) Modified Rankin: No symptoms            Visit Information  Last PT  Received On: 01/23/12 Assistance Needed: +1    Subjective Data  Subjective: Patient reports feels back to normal Patient Stated Goal: Return home and prevent future occurences   Prior Functioning  Home Living Lives With: Spouse Type of Home: House Home Access: Stairs to enter [employer] of Steps: 3 Home Layout: Two level;Able to live on main level with bedroom/bathroom Alternate Level Stairs-Number of Steps: Flight Home Adaptive Equipment: None Prior Function Level of Independence: Independent Driving: Yes Vocation: Retired Musician: No difficulties    Cognition  Overall Cognitive Status: Appears within functional limits for tasks assessed/performed Arousal/Alertness: Awake/alert Orientation Level: Oriented X4 / Intact Behavior During Session: WFL for tasks performed    Extremity/Trunk Assessment Right Lower Extremity Assessment RLE ROM/Strength/Tone: Within functional levels Left Lower Extremity Assessment LLE ROM/Strength/Tone: Within functional levels Trunk Assessment Trunk Assessment: Normal   Balance Balance Balance Assessed: Yes Static Standing Balance Rhomberg - Eyes Opened: 60  Standardized Balance Assessment Standardized Balance Assessment: Dynamic Gait Index Dynamic Gait Index Level Surface: Mild Impairment (exernal rotation bil. lower extremities) Change in Gait Speed: Mild Impairment Gait with Horizontal Head Turns: Normal Gait with Vertical Head Turns: Normal Gait and Pivot Turn: Normal Step Over Obstacle: Normal Step Around Obstacles: Normal Steps: Mild Impairment Total Score: 21  High Level Balance High Level Balance Activites: Backward walking;Turns;Direction changes;Sudden stops High Level Balance Comments: 360 degree turns 6 steps not indicative of falls  End of Session PT - End of Session Equipment Utilized During Treatment: Gait belt Activity Tolerance: Patient tolerated treatment well Patient left: in  bed;with family/visitor present;with call bell/phone within reach Nurse Communication: Mobility status   Edwyna Perfect, PT  Pager [phone]  01/23/2012, 7:59  AM

## 2012-01-24 ENCOUNTER — Encounter (HOSPITAL_COMMUNITY)
Admission: EM | Disposition: A | Discharge status: Home / Self Care | Payer: Self-pay | Source: Home / Self Care | Attending: Internal Medicine

## 2012-01-24 ENCOUNTER — Encounter (HOSPITAL_COMMUNITY): Payer: Self-pay | Admitting: *Deleted

## 2012-01-24 DIAGNOSIS — I6789 Other cerebrovascular disease: Secondary | ICD-10-CM

## 2012-01-24 HISTORY — PX: TEE WITHOUT CARDIOVERSION: SHX5443

## 2012-01-24 SURGERY — ECHOCARDIOGRAM, TRANSESOPHAGEAL
Anesthesia: Moderate Sedation

## 2012-01-24 MED ORDER — WARFARIN - PHARMACIST DOSING INPATIENT: Freq: Every day | Status: DC

## 2012-01-24 MED ORDER — ATORVASTATIN CALCIUM 10 MG PO TABS: 20.0000 mg | ORAL_TABLET | Freq: Every day | ORAL | Status: DC

## 2012-01-24 MED ORDER — ASPIRIN 325 MG PO TBEC: 325.0000 mg | DELAYED_RELEASE_TABLET | Freq: Every day | ORAL | Status: DC

## 2012-01-24 MED ORDER — FENTANYL CITRATE 0.05 MG/ML IJ SOLN
INTRAMUSCULAR | Status: DC | PRN
  Administered 2012-01-24 (×2): 25 ug via INTRAVENOUS

## 2012-01-24 MED ORDER — WARFARIN VIDEO
Freq: Once | Status: AC
  Administered 2012-01-24: 17:00:00

## 2012-01-24 MED ORDER — SODIUM CHLORIDE 0.9 % IV SOLN
INTRAVENOUS | Status: DC
  Administered 2012-01-24: 500 mL via INTRAVENOUS

## 2012-01-24 MED ORDER — FENTANYL CITRATE 0.05 MG/ML IJ SOLN
INTRAMUSCULAR | Status: AC
  Filled 2012-01-24: qty 2

## 2012-01-24 MED ORDER — MIDAZOLAM HCL 10 MG/2ML IJ SOLN
INTRAMUSCULAR | Status: AC
  Filled 2012-01-24: qty 2

## 2012-01-24 MED ORDER — MIDAZOLAM HCL 10 MG/2ML IJ SOLN
INTRAMUSCULAR | Status: DC | PRN
  Administered 2012-01-24 (×2): 2 mg via INTRAVENOUS

## 2012-01-24 MED ORDER — BUTAMBEN-TETRACAINE-BENZOCAINE 2-2-14 % EX AERO
INHALATION_SPRAY | CUTANEOUS | Status: DC | PRN
  Administered 2012-01-24: 2 via TOPICAL

## 2012-01-24 MED ORDER — WARFARIN SODIUM 5 MG PO TABS
5.0000 mg | ORAL_TABLET | ORAL | Status: AC
  Administered 2012-01-24: 5 mg via ORAL
  Filled 2012-01-24: qty 1

## 2012-01-24 MED ORDER — WARFARIN SODIUM 5 MG PO TABS: 5.0000 mg | ORAL_TABLET | Freq: Every day | ORAL | Status: DC

## 2012-01-24 MED ORDER — COUMADIN BOOK
Freq: Once | Status: AC
  Administered 2012-01-24: 17:00:00
  Filled 2012-01-24: qty 1

## 2012-01-24 NOTE — Discharge Instructions (Signed)
STROKE/TIA DISCHARGE INSTRUCTIONS SMOKING Cigarette smoking nearly doubles your risk of having a stroke & is the single most alterable risk factor  If you smoke or have smoked in the last 12 months, you are advised to quit smoking for your health.  Most of the excess cardiovascular risk related to smoking disappears within a year of stopping.  Ask you doctor about anti-smoking medications  [place] Quit Line: 1-800-QUIT NOW  Free Smoking Cessation Classes [phone]  CHOLESTEROL Know your levels; limit fat & cholesterol in your diet  Lipid Panel     Component Value Date/Time   CHOL 182 01/21/2012 2147   TRIG 147 01/21/2012 2147   HDL 52 01/21/2012 2147   CHOLHDL 3.5 01/21/2012 2147   VLDL 29 01/21/2012 2147   LDLCALC 101* 01/21/2012 2147      Many patients benefit from treatment even if their cholesterol is at goal.  Goal: Total Cholesterol (CHOL) less than 160  Goal:  Triglycerides (TRIG) less than 150  Goal:  HDL greater than 40  Goal:  LDL (LDLCALC) less than 100   BLOOD PRESSURE American Stroke Association blood pressure target is less that 120/80 mm/Hg  Your discharge blood pressure is:  BP: 156/72 mmHg  Monitor your blood pressure  Limit your salt and alcohol intake  Many individuals will require more than one medication for high blood pressure  DIABETES (A1c is a blood sugar average for last 3 months) Goal HGBA1c is under 7% (HBGA1c is blood sugar average for last 3 months)  Diabetes: Diagnosis of diabetes:  Your A1c:6.6 %    Lab Results  Component Value Date   HGBA1C 6.6* 01/21/2012     Your HGBA1c can be lowered with medications, healthy diet, and exercise.  Check your blood sugar as directed by your physician  Call your physician if you experience unexplained or low blood sugars.  PHYSICAL ACTIVITY/REHABILITATION Goal is 30 minutes at least 4 days per week    Activity:   No restrictions.  Activity decreases your risk of heart attack and stroke and makes your  heart stronger.  It helps control your weight and blood pressure; helps you relax and can improve your mood.  Participate in a regular exercise program.  Talk with your doctor about the best form of exercise for you (dancing, walking, swimming, cycling).  DIET/WEIGHT Goal is to maintain a healthy weight  Your discharge diet is: Heart Healthy Your height is:  Height: 5\' 4"  (162.6 cm) Your current weight is: Weight: 78.019 kg (172 lb) Your Body Mass Index (BMI) is:  BMI (Calculated): 29.6   Following the type of diet specifically designed for you will help prevent another stroke.  Your goal weight range is:  115-140  Your goal Body Mass Index (BMI) is 19-24.  Healthy food habits can help reduce 3 risk factors for stroke:  High cholesterol, hypertension, and excess weight.  RESOURCES Stroke/Support Group:  Call [phone]  they meet the 3rd Sunday of the month on the Rehab Unit at [facility], [place] ( no meetings June, July & Aug).  STROKE EDUCATION PROVIDED/REVIEWED AND GIVEN TO PATIENT Stroke warning signs and symptoms How to activate emergency medical system (call 911). Medications prescribed at discharge. Need for follow-up after discharge. Personal risk factors for stroke. Pneumonia vaccine given:   No Flu vaccine given:   No My questions have been answered, the writing is legible, and I understand these instructions.  I will adhere to these goals & educational materials that have been  provided to me after my discharge from the hospital.

## 2012-01-24 NOTE — Progress Notes (Signed)
@   Subjective:  Denies CP or dyspnea   Objective:  Filed Vitals:   01/23/12 2000 01/24/12 0000 01/24/12 0400 01/24/12 0800  BP: 143/80 120/82 131/54 113/54  Pulse: 75 76 84 78  Temp: 98.5 F (36.9 C) 98.4 F (36.9 C) 98.3 F (36.8 C) 98.5 F (36.9 C)  TempSrc: Oral Oral Oral Oral  Resp: 18 18 18 20   Height:      Weight:      SpO2: 98% 100% 99% 97%    Intake/Output from previous day:  Intake/Output Summary (Last 24 hours) at 01/24/12 1006 Last data filed at 01/23/12 1700  Gross per 24 hour  Intake    840 ml  Output      0 ml  Net    840 ml    Physical Exam: Physical exam: Well-developed well-nourished in no acute distress.  Skin is warm and dry.  HEENT is normal.  Neck is supple.  Chest is clear to auscultation with normal expansion.  Cardiovascular exam is regular rate and rhythm.  Abdominal exam nontender or distended. No masses palpated. Extremities show no edema.     Lab Results: Basic Metabolic Panel:  Basename 01/21/12 1718  NA 138  K 3.9  CL 103  CO2 23  GLUCOSE 116*  BUN 15  CREATININE 1.07  CALCIUM 9.4  MG --  PHOS --   CBC:  Basename 01/21/12 1909  WBC 7.7  NEUTROABS 5.5  HGB 13.2  HCT 40.4  MCV 88.6  PLT 185     Assessment/Plan:  1 S/P CVA - For TEE today; if significant spontaneous contrast noted or LAA thrombus, will need coumadin. No atrial fibrillation on telemetry. 2) MS/AS - For TEE today; will need close FU with Dr Dietrich Pates. She will most likely need MVR and AVR in the future. 3) Hyperlipidemia - continue statin 4) Hypertension - continue present BP meds. 5) H/O breast CA   Olga Millers 01/24/2012, 10:06 AM

## 2012-01-24 NOTE — Interval H&P Note (Signed)
History and Physical Interval Note:  01/24/2012 1:11 PM  [name]  has presented today for surgery, with the diagnosis of stroke  The various methods of treatment have been discussed with the patient and family. After consideration of risks, benefits and other options for treatment, the patient has consented to  Procedure(s) (LRB): TRANSESOPHAGEAL ECHOCARDIOGRAM (TEE) (N/A) as a surgical intervention .  The patients' history has been reviewed, patient examined, no change in status, stable for surgery.  I have reviewed the patients' chart and labs.  Questions were answered to the patient's satisfaction.     Olga Millers

## 2012-01-24 NOTE — Progress Notes (Signed)
Stroke Team Progress Note  HISTORY [name] is an 66 y.o. female who presented to [facility] ED 01/21/2012 afternoon with c/o gait ataxia and confusion. Sx began about 5-7 days ago and have progressively gotten worse. 2 days ago, she had a fall due to leg weakness. Friday night, she kept repeating questions and was disoriented. Brought to ER by family because she wasn't getting better. Head CT revealed subacute ischemia vs frontal lobe mets. Neuro consult requested. Does not take aspirin at home. Patient was not a TPA candidate secondary to delay in arrival. She was admitted for further evaluation and treatment.  SUBJECTIVE Her husbnad is at the bedside.  Overall she feels her condition is completely resolved, no further symptoms.TEE scheduled for 11 am today.  OBJECTIVE Most recent Vital Signs: Filed Vitals:   01/23/12 2000 01/24/12 0000 01/24/12 0400 01/24/12 0800  BP: 143/80 120/82 131/54 113/54  Pulse: 75 76 84 78  Temp: 98.5 F (36.9 C) 98.4 F (36.9 C) 98.3 F (36.8 C) 98.5 F (36.9 C)  TempSrc: Oral Oral Oral Oral  Resp: 18 18 18 20   Height:      Weight:      SpO2: 98% 100% 99% 97%   CBG (last 3)  No results found for this basename: GLUCAP:3 in the last 72 hours Intake/Output from previous day: 04/22 0701 - 04/23 0700 In: 840 [P.O.:840] Out: -   IV Fluid Intake:     MEDICATIONS     . aspirin EC  325 mg Oral Daily  . atorvastatin  20 mg Oral q1800  . buPROPion  150 mg Oral Daily  . calcium-vitamin D  1 tablet Oral Daily  . cholecalciferol  1,000 Units Oral Daily  . enoxaparin  40 mg Subcutaneous Q24H  . furosemide  40 mg Oral Daily  . levothyroxine  176 mcg Oral Custom  . levothyroxine  88 mcg Oral Custom  . pantoprazole  40 mg Oral Q1200  . potassium chloride  10 mEq Oral Daily  . sodium chloride  3 mL Intravenous Q12H   PRN:    Diet:  NPO  Activity:   Bathroom privileges with assistance DVT Prophylaxis:  Lovenox 40 mg sq daily   CLINICALLY  SIGNIFICANT STUDIES CBC    Component Value Date/Time   WBC 7.7 01/21/2012 1909   RBC 4.56 01/21/2012 1909   HGB 13.2 01/21/2012 1909   HCT 40.4 01/21/2012 1909   PLT 185 01/21/2012 1909   MCV 88.6 01/21/2012 1909   MCH 28.9 01/21/2012 1909   MCHC 32.7 01/21/2012 1909   RDW 14.1 01/21/2012 1909   LYMPHSABS 1.4 01/21/2012 1909   MONOABS 0.6 01/21/2012 1909   EOSABS 0.2 01/21/2012 1909   BASOSABS 0.0 01/21/2012 1909   CMP    Component Value Date/Time   NA 138 01/21/2012 1718   K 3.9 01/21/2012 1718   CL 103 01/21/2012 1718   CO2 23 01/21/2012 1718   GLUCOSE 116* 01/21/2012 1718   BUN 15 01/21/2012 1718   CREATININE 1.07 01/21/2012 1718   CALCIUM 9.4 01/21/2012 1718   GFRNONAA 52* 01/21/2012 1718   GFRAA 61* 01/21/2012 1718   COAGS Lab Results  Component Value Date   INR 1.08 01/21/2012   Lipid Panel    Component Value Date/Time   CHOL 182 01/21/2012 2147   TRIG 147 01/21/2012 2147   HDL 52 01/21/2012 2147   CHOLHDL 3.5 01/21/2012 2147   VLDL 29 01/21/2012 2147   LDLCALC 101* 01/21/2012 2147  HgbA1C  Lab Results  Component Value Date   HGBA1C 6.6* 01/21/2012   Cardiac Panel (last 3 results) No results found for this basename: CKTOTAL:3,CKMB:3,TROPONINI:3,RELINDX:3 in the last 72 hours Urinalysis    Component Value Date/Time   COLORURINE YELLOW 01/21/2012 1838   APPEARANCEUR CLEAR 01/21/2012 1838   LABSPEC 1.005 01/21/2012 1838   PHURINE 7.0 01/21/2012 1838   GLUCOSEU NEGATIVE 01/21/2012 1838   HGBUR NEGATIVE 01/21/2012 1838   BILIRUBINUR NEGATIVE 01/21/2012 1838   KETONESUR NEGATIVE 01/21/2012 1838   PROTEINUR NEGATIVE 01/21/2012 1838   UROBILINOGEN 0.2 01/21/2012 1838   NITRITE NEGATIVE 01/21/2012 1838   LEUKOCYTESUR NEGATIVE 01/21/2012 1838   Urine Drug Screen  No results found for this basename: labopia,  cocainscrnur,  labbenz,  amphetmu,  thcu,  labbarb    Alcohol Level No results found for this basename: eth   CT of the brain  1.  Hypoattenuation within the right frontal lobe.   Differential considerations include subacute ischemia or (given history of breast cancer and suggestion of vasogenic edema) metastatic disease.  Correlate with clinical symptoms.  If further imaging characterization is desired, pre and post contrast brain MR should be considered.  2.  Small vessel ischemic change.   MRI of the brain  Acute right ACA territory infarct affecting the medial right frontal lobe and white matter.  No hemorrhage or midline shift.  Mild atrophy and chronic microvascular ischemic change.   MRA of the brain   Probable embolic occlusion right ACA distal A3 segment, at the pericallosal/callosomarginal bifurcation.  See comments above.  No proximal intracranial atherosclerotic disease.    2D Echocardiogram  I have carefully compared this study to the study of 10/2011. There is NSR. There is rheumatic MS, which increases the chance of embolic potential. There is no obvious shunt. The measurements are complicated. I have summarized the grades of the valvular abnormalities. They are similar to the study of 10/2011. No cardiac source of embolism was identified, but cannot be ruled out on the basis of this examination.  Carotid Doppler  No internal carotid artery stenosis bilaterally. Vertebrals with antegrade flow bilaterally.   CXR  01/21/2012  1.  Cardiomegaly and probable interstitial edema. 2.  Possible early infiltrate in the left lower lobe.      Physical Exam   Pleasant middle aged caucasian lady not in distress.Awake alert. Afebrile. Head is nontraumatic. Neck is supple without bruit. Hearing is normal. Cardiac exam no murmur or gallop. Lungs are clear to auscultation. Distal pulses are well felt.   Neurologic exam ; Awake  Alert oriented x 3. Normal speech and language.eye movements full without nystagmus. Face symmetric. Tongue midline. Normal strength, tone, reflexes and coordination except minimal left hip and ankle dorsiflexor weakness.. Normal sensation. Gait steady but  broad based.  ASSESSMENT [name] is a 66 y.o. female with a  right ACA territory infarct secondary to embolus, possible cardioembolic etiology. TEE scheduled for today at 11a. On no antiplatelets prior to admission. Now on aspirin 325 mg orally every day for secondary stroke prevention. Patients ataxia and disorientation have recovered completely.  -rheumatic heart disease with MS -breast CA 1995 GERD -hyperlipidemia -hypertension  Hospital day # 3  TREATMENT/PLAN -Continue aspirin 325 mg orally every day for secondary stroke prevention. -TEE  Today at 11a. TEE to look for embolic source. If positive for PFO (patent foramen ovale), needs bilateral lower extremity dopplers to rule out DVT as source of stroke. -Please schedule outpatient telemetry monitoring to  assess patient for atrial fibrillation as source of stroke. May be arranged with patient's cardiologist, Rothbart. -neuro ok for discharge after TEE  -Follow up with Dr. Pearlean Brownie in 2 months. -D/w patient ,husband and Dr Lavera Guise.Marland Kitchen  Joaquin Music, ANP-BC, GNP-BC [facility] Pager: [phone] 01/24/2012 9:37 AM  Dr. Delia Heady, Stroke Center Medical Director, has personally reviewed chart, pertinent data, examined the patient and developed the plan of care.

## 2012-01-24 NOTE — CV Procedure (Signed)
See results of TEE in [employer]; rheumatic MV with mild to moderate MS; mild MR; moderate LAE with mild spontaneous contrast; LAA not well visualized; ? Due to thrombus; patient will most likely require anticoagulation with coumadin. Olga Millers

## 2012-01-24 NOTE — Progress Notes (Signed)
Pt discharged to home per MD order.  Pt received all discharge instructions and medication information, including prescriptions and coumadin education.  Pt alert and oriented with no complaints of pain.  Pt received all stroke education materials as well. NIH documented on discharge.  Efraim Kaufmann

## 2012-01-24 NOTE — H&P (View-Only) (Signed)
@   Subjective:  Denies CP or dyspnea   Objective:  Filed Vitals:   01/23/12 2000 01/24/12 0000 01/24/12 0400 01/24/12 0800  BP: 143/80 120/82 131/54 113/54  Pulse: 75 76 84 78  Temp: 98.5 F (36.9 C) 98.4 F (36.9 C) 98.3 F (36.8 C) 98.5 F (36.9 C)  TempSrc: Oral Oral Oral Oral  Resp: 18 18 18 20  Height:      Weight:      SpO2: 98% 100% 99% 97%    Intake/Output from previous day:  Intake/Output Summary (Last 24 hours) at 01/24/12 1006 Last data filed at 01/23/12 1700  Gross per 24 hour  Intake    840 ml  Output      0 ml  Net    840 ml    Physical Exam: Physical exam: Well-developed well-nourished in no acute distress.  Skin is warm and dry.  HEENT is normal.  Neck is supple.  Chest is clear to auscultation with normal expansion.  Cardiovascular exam is regular rate and rhythm.  Abdominal exam nontender or distended. No masses palpated. Extremities show no edema.     Lab Results: Basic Metabolic Panel:  Basename 01/21/12 1718  NA 138  K 3.9  CL 103  CO2 23  GLUCOSE 116*  BUN 15  CREATININE 1.07  CALCIUM 9.4  MG --  PHOS --   CBC:  Basename 01/21/12 1909  WBC 7.7  NEUTROABS 5.5  HGB 13.2  HCT 40.4  MCV 88.6  PLT 185     Assessment/Plan:  1 S/P CVA - For TEE today; if significant spontaneous contrast noted or LAA thrombus, will need coumadin. No atrial fibrillation on telemetry. 2) MS/AS - For TEE today; will need close FU with Dr Rothbart. She will most likely need MVR and AVR in the future. 3) Hyperlipidemia - continue statin 4) Hypertension - continue present BP meds. 5) H/O breast CA   [name] 01/24/2012, 10:06 AM    

## 2012-01-24 NOTE — Progress Notes (Signed)
ANTICOAGULATION CONSULT NOTE - Initial Consult  Pharmacy Consult:  Coumadin Indication: ? LAA thrombus, secondary stroke prevention  No Known Allergies  Patient Measurements: Height: 5\' 4"  (162.6 cm) Weight: 172 lb (78.019 kg) IBW/kg (Calculated) : 54.7   Vital Signs: Temp: 97.6 F (36.4 C) (04/23 1624) Temp src: Oral (04/23 1624) BP: 119/77 mmHg (04/23 1624) Pulse Rate: 78  (04/23 1624)  Labs:  Basename 01/21/12 2148 01/21/12 1909 01/21/12 1718  HGB -- 13.2 --  HCT -- 40.4 --  PLT -- 185 --  APTT 30 -- --  LABPROT 14.2 -- --  INR 1.08 -- --  HEPARINUNFRC -- -- --  CREATININE -- -- 1.07  CKTOTAL -- -- --  CKMB -- -- --  TROPONINI -- -- --   Estimated Creatinine Clearance: 51.5 ml/min (by C-G formula based on Cr of 1.07).  Medical History: Past Medical History  Diagnosis Date  . Rheumatic heart disease     mild AS/AI and mild MS/trivial MR  . Gastroesophageal reflux disease     +gastoparesis  . Hyperlipidemia   . Hypertension   . Heart murmur   . Stroke   . H/O cardiac catheterization 1993  . Type 2 diabetes mellitus     "120 at fasting" - prediabetic  . Asthma     pt denies this  . Chronic kidney disease     "GFR is elevated all the time"  . Breast carcinoma 1995    1995  . Depression   . Shortness of breath     on exertion      Assessment: 65 YOF to start Coumadin for possible LAA thrombus and secondary stroke prevention.  Baseline INR at 1.08.  Patient to be discharged now.   Goal of Therapy:  INR 2 - 3    Plan:  - Coumadin 5mg  PO now, then daily at 1800 until follow-up appointment with MD on 01/26/12.     Leighanna Kirn D. Laney Potash, PharmD, BCPS Pager:  [phone] 01/24/2012, 4:37 PM

## 2012-01-25 ENCOUNTER — Emergency Department (HOSPITAL_COMMUNITY)
Admission: EM | Admit: 2012-01-25 | Discharge: 2012-01-25 | Disposition: A | Discharge status: Home / Self Care | Payer: Medicare Other | Attending: Emergency Medicine | Admitting: Emergency Medicine

## 2012-01-25 ENCOUNTER — Encounter (HOSPITAL_COMMUNITY): Payer: Self-pay | Admitting: Cardiology

## 2012-01-25 ENCOUNTER — Emergency Department (HOSPITAL_COMMUNITY): Payer: Medicare Other

## 2012-01-25 DIAGNOSIS — K219 Gastro-esophageal reflux disease without esophagitis: Secondary | ICD-10-CM | POA: Insufficient documentation

## 2012-01-25 DIAGNOSIS — R29898 Other symptoms and signs involving the musculoskeletal system: Secondary | ICD-10-CM | POA: Insufficient documentation

## 2012-01-25 DIAGNOSIS — E119 Type 2 diabetes mellitus without complications: Secondary | ICD-10-CM | POA: Insufficient documentation

## 2012-01-25 DIAGNOSIS — R531 Weakness: Secondary | ICD-10-CM

## 2012-01-25 DIAGNOSIS — Z7901 Long term (current) use of anticoagulants: Secondary | ICD-10-CM | POA: Insufficient documentation

## 2012-01-25 DIAGNOSIS — Z8673 Personal history of transient ischemic attack (TIA), and cerebral infarction without residual deficits: Secondary | ICD-10-CM | POA: Insufficient documentation

## 2012-01-25 DIAGNOSIS — G9389 Other specified disorders of brain: Secondary | ICD-10-CM | POA: Insufficient documentation

## 2012-01-25 DIAGNOSIS — I10 Essential (primary) hypertension: Secondary | ICD-10-CM | POA: Insufficient documentation

## 2012-01-25 DIAGNOSIS — E785 Hyperlipidemia, unspecified: Secondary | ICD-10-CM | POA: Insufficient documentation

## 2012-01-25 DIAGNOSIS — F3289 Other specified depressive episodes: Secondary | ICD-10-CM | POA: Insufficient documentation

## 2012-01-25 DIAGNOSIS — Z79899 Other long term (current) drug therapy: Secondary | ICD-10-CM | POA: Insufficient documentation

## 2012-01-25 DIAGNOSIS — I099 Rheumatic heart disease, unspecified: Secondary | ICD-10-CM | POA: Insufficient documentation

## 2012-01-25 DIAGNOSIS — Z7982 Long term (current) use of aspirin: Secondary | ICD-10-CM | POA: Insufficient documentation

## 2012-01-25 DIAGNOSIS — F329 Major depressive disorder, single episode, unspecified: Secondary | ICD-10-CM | POA: Insufficient documentation

## 2012-01-25 LAB — PROTIME-INR
INR: 1.05 (ref 0.00–1.49)
Prothrombin Time: 13.9 seconds (ref 11.6–15.2)

## 2012-01-25 LAB — CBC
Hemoglobin: 12.7 g/dL (ref 12.0–15.0)
MCH: 27.9 pg (ref 26.0–34.0)
Platelets: 161 10*3/uL (ref 150–400)
RBC: 4.56 MIL/uL (ref 3.87–5.11)
WBC: 6.8 10*3/uL (ref 4.0–10.5)

## 2012-01-25 LAB — URINALYSIS, ROUTINE W REFLEX MICROSCOPIC
Ketones, ur: NEGATIVE mg/dL
Leukocytes, UA: NEGATIVE
Nitrite: NEGATIVE
Protein, ur: NEGATIVE mg/dL

## 2012-01-25 LAB — DIFFERENTIAL
Eosinophils Absolute: 0.2 10*3/uL (ref 0.0–0.7)
Lymphocytes Relative: 15 % (ref 12–46)
Lymphs Abs: 1 10*3/uL (ref 0.7–4.0)
Neutro Abs: 4.9 10*3/uL (ref 1.7–7.7)
Neutrophils Relative %: 73 % (ref 43–77)

## 2012-01-25 LAB — BASIC METABOLIC PANEL
Chloride: 99 mEq/L (ref 96–112)
GFR calc non Af Amer: 46 mL/min — ABNORMAL LOW (ref >90)
Glucose, Bld: 142 mg/dL — ABNORMAL HIGH (ref 70–99)
Potassium: 4.2 mEq/L (ref 3.5–5.1)
Sodium: 134 mEq/L — ABNORMAL LOW (ref 135–145)

## 2012-01-25 MED ORDER — SODIUM CHLORIDE 0.9 % IV SOLN
INTRAVENOUS | Status: DC
  Administered 2012-01-25: 12:00:00 via INTRAVENOUS

## 2012-01-25 NOTE — ED Notes (Signed)
Discharge instructions reviewed with pt, questions answered. Pt verbalized understanding.  

## 2012-01-25 NOTE — ED Notes (Signed)
Pt d/c from [facility] yesterday with dx of stroke.  Pt reports experiencing weakness to bil legs.  States she was standing at counter and her "legs wouldn't hold me up."  Pt began to fall, husband caught pt before hitting floor.  Denies hitting head, c/o pain to left hip.  Alert and oriented x 4.

## 2012-01-25 NOTE — ED Provider Notes (Signed)
History   This chart was scribed for Sara Anger, DO by Clarita Crane. The patient was seen in room APA09/APA09.     CSN: [id number]  Arrival date & time 01/25/12  1108   First MD Initiated Contact with Patient 01/25/12 1134      Chief Complaint  Patient presents with  . Extremity Weakness    HPI Pt was seen at 11:40 AM [name] is a 66 y.o. female seen at 11:42 AM who presents to the Emergency Department complaining of gradual onset and persistence of constant weakness of her bilateral lower extremities which began this morning. Pt states she was standing, felt both of her "legs get weak," and began to fall.  Pt's spouse put a chair under her to catch her before she fell.  Denies falling to floor, no syncope, no chest pain/palpitations, no fever, no N/V, no numbness/tingling of extremities, no focal motor weakness, no abd pain, no back pain.  Patient and family member reports that the patient was discharged from [facility] yesterday after being evaluated and treated for:  stroke (taking ASA) and possible left atrial thrombus (taking coumadin).  Pt has appt with Cards MD Rothbart tomorrow.    Past Medical History  Diagnosis Date  . Rheumatic heart disease     mild AS/AI and mild MS/trivial MR  . Gastroesophageal reflux disease     +gastoparesis  . Hyperlipidemia   . Hypertension   . Heart murmur   . Stroke   . H/O cardiac catheterization 1993  . Type 2 diabetes mellitus     "120 at fasting" - prediabetic  . Asthma     pt denies this  . Chronic kidney disease     "GFR is elevated all the time"  . Breast carcinoma 1995    1995  . Depression   . Shortness of breath     on exertion  . Diabetes mellitus     Past Surgical History  Procedure Date  . Mitral valve surgery [facility] closed mitral valvulotomy by finger fracture  . Cardiac catheterization   . Dilation and curettage of uterus   . Mastectomy 1995    lumpectomy  . Tubal ligation 1973  . Tee  without cardioversion 01/24/2012    Procedure: TRANSESOPHAGEAL ECHOCARDIOGRAM (TEE);  Surgeon: Lewayne Bunting, MD;  Location: [facility] ENDOSCOPY;  Service: Cardiovascular;  Laterality: N/A;    Family History  Problem Relation Age of Onset  . Heart disease Brother 50    MI  . Rheum arthritis Maternal Grandmother   . Asthma Maternal Grandfather   . Heart disease Mother     History  Substance Use Topics  . Smoking status: Never Smoker   . Smokeless tobacco: Never Used  . Alcohol Use: No    Review of Systems ROS: Statement: All systems negative except as marked or noted in the HPI; Constitutional: Negative for fever and chills. ; ; Eyes: Negative for eye pain, redness and discharge. ; ; ENMT: Negative for ear pain, hoarseness, nasal congestion, sinus pressure and sore throat. ; ; Cardiovascular: Negative for chest pain, palpitations, diaphoresis, dyspnea and peripheral edema. ; ; Respiratory: Negative for cough, wheezing and stridor. ; ; Gastrointestinal: Negative for nausea, vomiting, diarrhea, abdominal pain, blood in stool, hematemesis, jaundice and rectal bleeding.; ; Genitourinary: Negative for dysuria, flank pain and hematuria. ; ; Musculoskeletal: Negative for back pain and neck pain. Negative for swelling and trauma.; ; Skin: Negative for pruritus, rash, abrasions,  blisters, bruising and skin lesion.; ; Neuro: +legs weakness.  Negative for headache, lightheadedness and neck stiffness. Negative for altered level of consciousness , altered mental status, paresthesias, involuntary movement, seizure and syncope.     Allergies  Review of patient's allergies indicates no known allergies.  Home Medications   Current Outpatient Rx  Name Route Sig Dispense Refill  . ASPIRIN 325 MG PO TBEC Oral Take 1 tablet (325 mg total) by mouth daily. 30 tablet 0    Stop taking when INR > 2  . ATORVASTATIN CALCIUM 10 MG PO TABS Oral Take 2 tablets (20 mg total) by mouth daily. 60 tablet 0  . BUPROPION HCL  ER (XL) 150 MG PO TB24 Oral Take 150 mg by mouth daily.      Marland Kitchen CALCIUM CITRATE-VITAMIN D 200-250 MG-UNIT PO TABS Oral Take 2 tablets by mouth daily.      Marland Kitchen VITAMIN D PO Oral Take 1 capsule by mouth daily.    Marland Kitchen LEVOTHYROXINE SODIUM 88 MCG PO TABS Oral Take 88 mcg by mouth daily. 1 tablet daily, except 2 tablets on Sunday    . LOSARTAN POTASSIUM 100 MG PO TABS Oral Take 50 mg by mouth daily.      Marland Kitchen OMEPRAZOLE 20 MG PO CPDR Oral Take 20 mg by mouth 2 (two) times daily with a meal.      . VITAMINS C E 500-400 MG-UNIT PO CAPS Oral Take 1 capsule by mouth daily.      . WARFARIN SODIUM 5 MG PO TABS Oral Take 1 tablet (5 mg total) by mouth daily. 30 tablet 0    BP 152/101  Pulse 71  Temp(Src) 98.3 F (36.8 C) (Oral)  Ht 5\' 2"  (1.575 m)  Wt 164 lb (74.39 kg)  BMI 30.00 kg/m2  SpO2 95%  Physical Exam Exam performed at 11:46AM Physical examination:  Nursing notes reviewed; Vital signs and O2 SAT reviewed;  Constitutional: Well developed, Well nourished, Well hydrated, In no acute distress; Head:  Normocephalic, atraumatic; Eyes: EOMI, PERRL, No scleral icterus; ENMT: Mouth and pharynx normal, Mucous membranes moist; Neck: Supple, Full range of motion, No lymphadenopathy; Cardiovascular: Regular rate and rhythm, No murmur, rub, or gallop; Respiratory: Breath sounds clear & equal bilaterally, No rales, rhonchi, wheezes, or rub, Normal respiratory effort/excursion; Chest: Nontender, Movement normal; Abdomen: Soft, Nontender, Nondistended, Normal bowel sounds; Extremities: Pulses normal, No tenderness, No edema, No calf edema or asymmetry.; Neuro: AA&Ox3, Major CN grossly intact.  Strength 5/5 equal bilat UE's and LE's.  DTR 2/4 equal bilat UE's and LE's.  No gross sensory deficits.  Normal cerebellar testing bilat UE's and LE's.  No pronator drift.  Speech clear.  No facial droop.  No nystagmus.;.; Skin: Color normal, Warm, Dry, no rash.    ED Course  Procedures   1150:  No neuro deficits.  Pt walked  with steady gait.  NIH score zero.  Not a code stroke or TPA candidate.  Will continue with workup.   MDM  MDM Reviewed: nursing note, vitals and previous chart Reviewed previous: ECG Interpretation: ECG, labs, x-ray, CT scan and MRI    Date: 01/25/2012  Rate: 68  Rhythm: normal sinus rhythm  QRS Axis: normal  Intervals: normal  ST/T Wave abnormalities: normal  Conduction Disutrbances:none  Narrative Interpretation:   Old EKG Reviewed: unchanged; no significant changes from previous EKG dated 01/23/2012.  Results for orders placed during the hospital encounter of 01/25/12  URINALYSIS, ROUTINE W REFLEX MICROSCOPIC  Component Value Range   Color, Urine YELLOW  YELLOW    APPearance CLEAR  CLEAR    Specific Gravity, Urine 1.010  1.005 - 1.030    pH 6.0  5.0 - 8.0    Glucose, UA NEGATIVE  NEGATIVE (mg/dL)   Hgb urine dipstick NEGATIVE  NEGATIVE    Bilirubin Urine NEGATIVE  NEGATIVE    Ketones, ur NEGATIVE  NEGATIVE (mg/dL)   Protein, ur NEGATIVE  NEGATIVE (mg/dL)   Urobilinogen, UA 0.2  0.0 - 1.0 (mg/dL)   Nitrite NEGATIVE  NEGATIVE    Leukocytes, UA NEGATIVE  NEGATIVE   PROTIME-INR      Component Value Range   Prothrombin Time 13.9  11.6 - 15.2 (seconds)   INR 1.05  0.00 - 1.49   CBC      Component Value Range   WBC 6.8  4.0 - 10.5 (K/uL)   RBC 4.56  3.87 - 5.11 (MIL/uL)   Hemoglobin 12.7  12.0 - 15.0 (g/dL)   HCT 62.9  52.8 - 41.3 (%)   MCV 89.0  78.0 - 100.0 (fL)   MCH 27.9  26.0 - 34.0 (pg)   MCHC 31.3  30.0 - 36.0 (g/dL)   RDW 24.4  01.0 - 27.2 (%)   Platelets 161  150 - 400 (K/uL)  DIFFERENTIAL      Component Value Range   Neutrophils Relative 73  43 - 77 (%)   Neutro Abs 4.9  1.7 - 7.7 (K/uL)   Lymphocytes Relative 15  12 - 46 (%)   Lymphs Abs 1.0  0.7 - 4.0 (K/uL)   Monocytes Relative 8  3 - 12 (%)   Monocytes Absolute 0.5  0.1 - 1.0 (K/uL)   Eosinophils Relative 3  0 - 5 (%)   Eosinophils Absolute 0.2  0.0 - 0.7 (K/uL)   Basophils Relative 1  0 - 1  (%)   Basophils Absolute 0.1  0.0 - 0.1 (K/uL)  BASIC METABOLIC PANEL      Component Value Range   Sodium 134 (*) 135 - 145 (mEq/L)   Potassium 4.2  3.5 - 5.1 (mEq/L)   Chloride 99  96 - 112 (mEq/L)   CO2 24  19 - 32 (mEq/L)   Glucose, Bld 142 (*) 70 - 99 (mg/dL)   BUN 26 (*) 6 - 23 (mg/dL)   Creatinine, Ser 5.36 (*) 0.50 - 1.10 (mg/dL)   Calcium 9.6  8.4 - 64.4 (mg/dL)   GFR calc non Af Amer 46 (*) >90 (mL/min)   GFR calc Af Amer 54 (*) >90 (mL/min)  TROPONIN I      Component Value Range   Troponin I <0.30  <0.30 (ng/mL)   Dg Chest 2 View 01/25/2012  *RADIOLOGY REPORT*  Clinical Data: Weakness.  Breast cancer.  CHEST - 2 VIEW  Comparison: 01/21/2012  Findings: The cardiopericardial silhouette is enlarged.  No edema or focal airspace consolidation. Interstitial markings are diffusely coarsened with chronic features. Imaged bony structures of the thorax are intact. Telemetry leads overlie the chest.  IMPRESSION: Cardiomegaly without overt pulmonary edema or focal lung consolidation.  Underlying chronic interstitial changes with some persistent probable atelectasis at the left base.  Original Report Authenticated By: ERIC A. MANSELL, M.D.    Ct Head Wo Contrast 01/25/2012  *RADIOLOGY REPORT*  Clinical Data: Leg weakness.  CT HEAD WITHOUT CONTRAST  Technique:  Contiguous axial images were obtained from the base of the skull through the vertex without contrast.  Comparison: 04/20/2013and MRI from  01/22/2012.  Findings: Right frontal infarct again noted.  No evidence for interval hemorrhage.  No new areas of acute to subacute ischemia are evident.  No hydrocephalus or abnormal extra-axial fluid collection. The visualized paranasal sinuses and mastoid air cells are clear.  IMPRESSION: Right frontal infarct is seen on previous studies.  No new or progressive disease within the brain.  Original Report Authenticated By: ERIC A. MANSELL, M.D.    Mr Brain Wo Contrast 01/25/2012  *RADIOLOGY REPORT*   Clinical Data: Increased weakness in legs.  Recent CVA.  MRI HEAD WITHOUT CONTRAST  Technique:  Multiplanar, multiecho pulse sequences of the brain and surrounding structures were obtained according to standard protocol without intravenous contrast.  Comparison: MRI 01/22/2012.  Findings: Area of restricted diffusion in the right frontal lobe is unchanged from the  prior MRI and compatible with recent infarction in the right anterior cerebral artery territory.  No new area of acute infarct is identified.  Chronic ischemic changes are present in the left cerebellum as noted previously.  Mild chronic ischemia in the cerebral white matter and left thalamus also unchanged.  Small chronic infarct head of the caudate on the left is unchanged.  Negative for hemorrhage or mass lesion.  Ventricle size is normal.  IMPRESSION: Acute right frontal infarct, unchanged from the MRI of 01/22/2012. No new area of acute infarct.  Original Report Authenticated By: Camelia Phenes, M.D.      1610:  Pt continues to remain stable, neuro exam unchanged.  Pt has ambulated multiple times with steady gait and easy resps during this ED visit.  MRI without acute CVA today.  T/C to Neuro Dr. Thad Ranger, case discussed, including:  HPI, pertinent PM/SHx, VS/PE, dx testing, ED course and treatment:  States pt should remain on ASA and coumadin only, NO lovenox or heparin should be added as a bridge due to high risk of cerebral bleeding, pt needs to call Neuro ofc to obtain f/u appointment.  Dx testing, as well as this D/W Neurologist, d/w pt and family.  Questions answered.  Verb understanding, agreeable to d/c home with outpt f/u.       I personally performed the services described in this documentation, which was scribed in my presence. The recorded information has been reviewed and considered. Tanija Germani Allison Quarry, DO 01/27/12 2246

## 2012-01-25 NOTE — ED Notes (Signed)
ems reports [facility]G en route 167.

## 2012-01-25 NOTE — Discharge Instructions (Signed)
RESOURCE GUIDE  Dental Problems  Patients with Medicaid: [facility]                     [facility] [address]                                           [address] Phone:  [phone]                                                  Phone:  [phone]  If unable to pay or uninsured, contact:  [facility] or [facility] to become qualified for the adult dental clinic.  Chronic Pain Problems Contact [facility]  [phone] Patients need to be referred by their primary care doctor.  Insufficient Money for Medicine Contact [employer]:  call "211" or [facility] 271-5999.  No Primary Care Doctor Call [facility]  832-8000 Other agencies that provide inexpensive medical care    [facility]  [phone]    [facility]  [phone]    [facility]  [phone]    Women's Clinic  [phone]    [facility]  [phone]    [facility]  [phone]  Psychological Services [facility]  [phone] [facility]  [phone] [facility]   [phone] (emergency services [phone])  Substance Abuse Resources [facility]  336-882-2125 [facility] 336-784-9470 [facility] 336-285-9073 [facility] 336-845-3988 Residential & Outpatient Substance Abuse Program  800-659-3381  Abuse/Neglect [place] Child Abuse Hotline (336) 641-3795 [place] Child Abuse Hotline 800-378-5315 (After Hours)  Emergency Shelter [facility] (336) 271-5985  Maternity Homes [facility] (336) 275-9566 [facility] (704) 372-4663  MRSA Hotline #:   832-7006    [place] Resources  [facility] [place]     [employer]                          [facility] [address] [place]                       [address]      [address]  [place]                                                [place]                            [place] Phone:  [phone]                                   Phone:  [phone]                 Phone:  [phone]  [facility] Phone:  [phone]    [facility] Child Abuse Hotline (203)256-6562 (304)846-0418 (After Hours)    Take your usual prescriptions as previously directed.  Call your regular medical doctor and the Neurologist tomorrow morning to schedule a follow up appointment within the next week.  Keep your appointment tomorrow with the Cardiologist as previously scheduled.  Return to the Emergency Department immediately sooner if worsening.

## 2012-01-26 ENCOUNTER — Ambulatory Visit (INDEPENDENT_AMBULATORY_CARE_PROVIDER_SITE_OTHER): Payer: Medicare Other | Admitting: *Deleted

## 2012-01-26 DIAGNOSIS — Z7901 Long term (current) use of anticoagulants: Secondary | ICD-10-CM

## 2012-01-26 DIAGNOSIS — I6350 Cerebral infarction due to unspecified occlusion or stenosis of unspecified cerebral artery: Secondary | ICD-10-CM

## 2012-01-26 DIAGNOSIS — I639 Cerebral infarction, unspecified: Secondary | ICD-10-CM

## 2012-01-26 LAB — POCT INR: INR: 1.2

## 2012-01-28 LAB — URINE CULTURE

## 2012-01-28 NOTE — Discharge Summary (Signed)
Patient ID: KONICA STANKOWSKI MRN: [id number] DOB/AGE: 10/17/1945 66 y.o. Primary Care Physician:FAGAN,ROY, MD, MD Admit date: 01/21/2012 Discharge date: 01/24/2012    Discharge Diagnoses:   Principal Problem:  *CVA (cerebral vascular accident) Active Problems:  Gastroesophageal reflux disease  Hyperlipidemia  Hypertension  DM type 2 (diabetes mellitus, type 2)  Hypothyroidism Mitral stenosis  Medication List  As of 01/28/2012 11:11 PM   START taking these medications         aspirin 325 MG EC tablet   Take 1 tablet (325 mg total) by mouth daily.      warfarin 5 MG tablet   Commonly known as: COUMADIN   Take 1 tablet (5 mg total) by mouth daily.         CHANGE how you take these medications         atorvastatin 10 MG tablet   Commonly known as: LIPITOR   Take 2 tablets (20 mg total) by mouth daily.   What changed: dose         CONTINUE taking these medications         buPROPion 150 MG 24 hr tablet   Commonly known as: WELLBUTRIN XL      CITRACAL PETITES/VITAMIN D 200-250 MG-UNIT Tabs   Generic drug: Calcium Citrate-Vitamin D      levothyroxine 88 MCG tablet   Commonly known as: SYNTHROID, LEVOTHROID      losartan 100 MG tablet   Commonly known as: COZAAR      omeprazole 20 MG capsule   Commonly known as: PRILOSEC      VITAMIN D PO      Vitamins C E 500-400 MG-UNIT Caps         STOP taking these medications         CHOICE DM FIB[facility]ST PO          Where to get your medications    These are the prescriptions that you need to pick up.   You may get these medications from any pharmacy.         aspirin 325 MG EC tablet   atorvastatin 10 MG tablet   warfarin 5 MG tablet            Discharged Condition:good  Neurologically intact    Consults:Neurology [facility]   Significant Diagnostic Studies: Dg Chest 2 View  01/25/2012  *RADIOLOGY REPORT*  Clinical Data: Weakness.  Breast cancer.  CHEST - 2 VIEW  Comparison: 01/21/2012   Findings: The cardiopericardial silhouette is enlarged.  No edema or focal airspace consolidation. Interstitial markings are diffusely coarsened with chronic features. Imaged bony structures of the thorax are intact. Telemetry leads overlie the chest.  IMPRESSION: Cardiomegaly without overt pulmonary edema or focal lung consolidation.  Underlying chronic interstitial changes with some persistent probable atelectasis at the left base.  Original Report Authenticated By: ERIC A. MANSELL, M.D.   Dg Chest 2 View  01/21/2012  *RADIOLOGY REPORT*  Clinical Data: Shortness of breath for 6 months.  Symptoms increasing for 2 months.  History of prior surgery in 1962.  CHEST - 2 VIEW  Comparison: 04/03/2009  Findings: Heart is enlarged.  There is prominence of interstitial markings, some of which may be chronic.  However, there is probable interstitial edema.  No overt alveolar edema identified.  More focal patchy density is seen at the left lung base, raising question of early infiltrate.  Surgical clips overlie the left hilar region and right axilla.  IMPRESSION:  1.  Cardiomegaly  and probable interstitial edema. 2.  Possible early infiltrate in the left lower lobe.  Original Report Authenticated By: Patterson Hammersmith, M.D.   Ct Head Wo Contrast  01/25/2012  *RADIOLOGY REPORT*  Clinical Data: Leg weakness.  CT HEAD WITHOUT CONTRAST  Technique:  Contiguous axial images were obtained from the base of the skull through the vertex without contrast.  Comparison: 04/20/2013and MRI from 01/22/2012.  Findings: Right frontal infarct again noted.  No evidence for interval hemorrhage.  No new areas of acute to subacute ischemia are evident.  No hydrocephalus or abnormal extra-axial fluid collection. The visualized paranasal sinuses and mastoid air cells are clear.  IMPRESSION: Right frontal infarct is seen on previous studies.  No new or progressive disease within the brain.  Original Report Authenticated By: ERIC A. MANSELL,  M.D.   Ct Head Wo Contrast  01/21/2012  *RADIOLOGY REPORT*  Clinical Data: Left-sided weakness.  History breast cancer.  CT HEAD WITHOUT CONTRAST  Technique:  Contiguous axial images were obtained from the base of the skull through the vertex without contrast.  Comparison: MRI of 06/09/2009.  Head CT of 11/17/2006.  Findings: Bone windows demonstrate hypoplastic frontal sinuses. Clear mastoid air cells.  Soft tissue windows demonstrate mild low density in the periventricular white matter likely related to small vessel disease.There is a probable remote lacunar infarct in the right parietal occipital white matter on image 17 of series 2.  Hypoattenuation within the right frontal lobe white matter.  No definite to cortical involvement.  No significant mass effect. Example images 15 - 20.  No hemorrhage, hydrocephalus, intra-axial, or extra-axial fluid collection.  IMPRESSION: 1.  Hypoattenuation within the right frontal lobe.  Differential considerations include subacute ischemia or (given history of breast cancer and suggestion of vasogenic edema) metastatic disease.  Correlate with clinical symptoms.  If further imaging characterization is desired, pre and post contrast brain MR should be considered.  2.  Small vessel ischemic change.  Original Report Authenticated By: Consuello Bossier, M.D.   Mr Brain Wo Contrast  01/25/2012  *RADIOLOGY REPORT*  Clinical Data: Increased weakness in legs.  Recent CVA.  MRI HEAD WITHOUT CONTRAST  Technique:  Multiplanar, multiecho pulse sequences of the brain and surrounding structures were obtained according to standard protocol without intravenous contrast.  Comparison: MRI 01/22/2012.  Findings: Area of restricted diffusion in the right frontal lobe is unchanged from the  prior MRI and compatible with recent infarction in the right anterior cerebral artery territory.  No new area of acute infarct is identified.  Chronic ischemic changes are present in the left cerebellum as  noted previously.  Mild chronic ischemia in the cerebral white matter and left thalamus also unchanged.  Small chronic infarct head of the caudate on the left is unchanged.  Negative for hemorrhage or mass lesion.  Ventricle size is normal.  IMPRESSION: Acute right frontal infarct, unchanged from the MRI of 01/22/2012. No new area of acute infarct.  Original Report Authenticated By: Camelia Phenes, M.D.   Mr Laqueta Jean Wo Contrast  01/22/2012  *RADIOLOGY REPORT*  Clinical Data:  Left lower extremity weakness beginning 2 days ago. History of breast cancer.  History of closed mitral valvulotomy. Suspected but ill-defined cerebrovascular disease.  Hypertension.  MRI HEAD WITHOUT AND WITH CONTRAST MRA HEAD WITHOUT CONTRAST  Technique:  Multiplanar, multiecho pulse sequences of the brain and surrounding structures were obtained without and with intravenous contrast.  Angiographic images of the head were obtained using MRA technique without contrast.  Contrast: 17mL MULTIHANCE GADOBENATE DIMEGLUMINE 529 MG/ML IV SOLN  Comparison:   CT head 01/21/2012.  MRI HEAD WITHOUT AND WITH CONTRAST  Findings: There is a moderate sized area of restricted diffusion and cytotoxic edema affecting the parasagittal medial right frontal lobe and adjacent subcortical white matter without hemorrhage, correlating with the observed abnormality on CT scan.   There is no mass lesion.  No evidence for vasogenic edema.  No midline shift. No other areas of acute infarction.  Mild age related atrophy.  Mild chronic microvascular ischemic change affects the periventricular and subcortical white matter. Remote subcentimeter infarcts affect the left inferior cerebellum. There is no midline shift.  Post infusion, there is no abnormal enhancement of the brain or meninges. No areas suspicious for intracranial metastatic disease are observed.  Normal pituitary and cerebellar tonsils.  No acute sinus or mastoid fluid.  Negative orbits.  IMPRESSION: Acute  right ACA territory infarct affecting the medial right frontal lobe and white matter.  No hemorrhage or midline shift.  Mild atrophy and chronic microvascular ischemic change.  MRA HEAD  Findings: The internal carotid arteries as well as the basilar artery, are all widely patent.  Vertebrals codominant.  There is no proximal ACA or MCA stenosis.  Both posterior cerebral arteries are widely patent.  At the bifurcation of the right ACA into the pericallosal and callosomarginal branches, there is an abrupt occlusion correlating with the observed pattern of infarction. This is an unusual location for intracranial atherosclerotic change; the observed pattern of infarction is likely embolic.  There is no visible aneurysm.  No visible cerebellar branch occlusion.  IMPRESSION: Probable embolic occlusion right ACA distal A3 segment, at the pericallosal/callosomarginal bifurcation.  See comments above.  No proximal intracranial atherosclerotic disease.  Original Report Authenticated By: Elsie Stain, M.D.   Mr Mra Head/brain Wo Cm  01/22/2012  *RADIOLOGY REPORT*  Clinical Data:  Left lower extremity weakness beginning 2 days ago. History of breast cancer.  History of closed mitral valvulotomy. Suspected but ill-defined cerebrovascular disease.  Hypertension.  MRI HEAD WITHOUT AND WITH CONTRAST MRA HEAD WITHOUT CONTRAST  Technique:  Multiplanar, multiecho pulse sequences of the brain and surrounding structures were obtained without and with intravenous contrast.  Angiographic images of the head were obtained using MRA technique without contrast.  Contrast: 17mL MULTIHANCE GADOBENATE DIMEGLUMINE 529 MG/ML IV SOLN  Comparison:   CT head 01/21/2012.  MRI HEAD WITHOUT AND WITH CONTRAST  Findings: There is a moderate sized area of restricted diffusion and cytotoxic edema affecting the parasagittal medial right frontal lobe and adjacent subcortical white matter without hemorrhage, correlating with the observed abnormality on CT  scan.   There is no mass lesion.  No evidence for vasogenic edema.  No midline shift. No other areas of acute infarction.  Mild age related atrophy.  Mild chronic microvascular ischemic change affects the periventricular and subcortical white matter. Remote subcentimeter infarcts affect the left inferior cerebellum. There is no midline shift.  Post infusion, there is no abnormal enhancement of the brain or meninges. No areas suspicious for intracranial metastatic disease are observed.  Normal pituitary and cerebellar tonsils.  No acute sinus or mastoid fluid.  Negative orbits.  IMPRESSION: Acute right ACA territory infarct affecting the medial right frontal lobe and white matter.  No hemorrhage or midline shift.  Mild atrophy and chronic microvascular ischemic change.  MRA HEAD  Findings: The internal carotid arteries as well as the basilar artery, are all widely patent.  Vertebrals codominant.  There is no proximal ACA or MCA stenosis.  Both posterior cerebral arteries are widely patent.  At the bifurcation of the right ACA into the pericallosal and callosomarginal branches, there is an abrupt occlusion correlating with the observed pattern of infarction. This is an unusual location for intracranial atherosclerotic change; the observed pattern of infarction is likely embolic.  There is no visible aneurysm.  No visible cerebellar branch occlusion.  IMPRESSION: Probable embolic occlusion right ACA distal A3 segment, at the pericallosal/callosomarginal bifurcation.  See comments above.  No proximal intracranial atherosclerotic disease.  Original Report Authenticated By: Elsie Stain, M.D.    Lab Results: No results found for this or any previous visit (from the past 48 hour(s)). Recent Results (from the past 240 hour(s))  URINE CULTURE     Status: Normal   Collection Time   01/25/12 11:58 AM      Component Value Range Status Comment   Specimen Description URINE, CLEAN CATCH   Final    Special Requests NONE    Final    Culture  Setup Time [id number]   Final    Colony Count 10,000 COLONIES/ML   Final    Culture ESCHERICHIA COLI   Final    Report Status 01/28/2012 FINAL   Final    Organism ID, Bacteria ESCHERICHIA COLI   Final      Hospital Course:  ZURIA FOSDICK is an 66 y.o. female who presented to [facility] ED 01/21/2012 afternoon with c/o gait ataxia and confusion. Sx began about 5-7 days ago and have progressively gotten worse. 2 days ago, she had a fall due to leg weakness. Friday night, she kept repeating questions and was disoriented. Brought to ER by family because she wasn't getting better. Head CT revealed subacute ischemia  Neuro consult requested. Does not take aspirin at home. Patient was not a TPA candidate secondary to delay in arrival. She was admitted for further evaluation and treatment. Sh remained without any new events in hospital and after TEE revealed lack of visualization of LAA due to possible thrombus she was started on coumadin.     Discharge Exam: Blood pressure 119/77, pulse 78, temperature 97.6 F (36.4 C), temperature source Oral, resp. rate 18, height 5\' 4"  (1.626 m), weight 78.019 kg (172 lb), SpO2 98.00%. Alert and oriented x3 Neurologically intact  CVS: RRR RS: CTAB  Disposition: home  Discharge Orders    Future Appointments: Provider: Department: Dept Phone: Center:   01/30/2012 2:20 PM [facility] [phone] [facility]   01/30/2012 2:40 PM Jodelle Gross, NP [facility] [phone] [id number]     Future Orders Please Complete By Expires   Diet - low sodium heart healthy      Increase activity slowly         Follow-up Information    Follow up with Gates Rigg, MD. Schedule an appointment as soon as possible for a visit in 2 months.   Contact information:   [address] [facility] [place] [address] [phone]       Follow up with Lyons Bing, MD on 01/26/2012. (10: 00 AM)    Contact information:   [address] [place] [address] [phone]          Signed: Lonia Blood 01/28/2012, 11:11 PM

## 2012-01-29 NOTE — ED Notes (Signed)
No action needed

## 2012-01-30 ENCOUNTER — Ambulatory Visit (INDEPENDENT_AMBULATORY_CARE_PROVIDER_SITE_OTHER): Payer: Medicare Other | Admitting: Adult Health

## 2012-01-30 ENCOUNTER — Ambulatory Visit (INDEPENDENT_AMBULATORY_CARE_PROVIDER_SITE_OTHER): Payer: Medicare Other | Admitting: *Deleted

## 2012-01-30 ENCOUNTER — Encounter: Payer: Self-pay | Admitting: Adult Health

## 2012-01-30 VITALS — BP 119/66 | HR 68 | Resp 16 | Ht 62.0 in | Wt 170.0 lb

## 2012-01-30 DIAGNOSIS — I10 Essential (primary) hypertension: Secondary | ICD-10-CM

## 2012-01-30 DIAGNOSIS — Z7901 Long term (current) use of anticoagulants: Secondary | ICD-10-CM

## 2012-01-30 DIAGNOSIS — I6350 Cerebral infarction due to unspecified occlusion or stenosis of unspecified cerebral artery: Secondary | ICD-10-CM

## 2012-01-30 DIAGNOSIS — E785 Hyperlipidemia, unspecified: Secondary | ICD-10-CM

## 2012-01-30 DIAGNOSIS — I639 Cerebral infarction, unspecified: Secondary | ICD-10-CM

## 2012-01-30 DIAGNOSIS — I669 Occlusion and stenosis of unspecified cerebral artery: Secondary | ICD-10-CM

## 2012-01-30 LAB — POCT INR: INR: 2.7

## 2012-01-30 NOTE — Progress Notes (Signed)
HPI: [name] is a 66 y/o patient of Dr. Dietrich [name] we are seeing post hospitalization for embolic CVA. She is followed for rheumatic heart disease with moderate-severe MS, hypertension and hyperlipidemia.with class II dyspnea on exertion. She came to ER after husband noticed that she was unconsciously listing and leaning to the left. She had also complained of some leg heaviness. CT in ER demonstrated "probable embolic occlusion of the R ICA. Echo did not reveal any cardiac source of CVA identified, valvular grades were similar to 10/2011 measurements. . A subsequent TEE revealed moderately dilated LA with possible thrombus in the appendage, with spontaneous echo contrast ("smoke") in the cavity.   She was placed on coumadin, full dose ASA, and a statin secondary to TC of 182, elevated triglycerides at 143.  It was also noted that Hgb A!C was elevated with follow-up per Dr. Ouida [name].  She continues to have some generalized weakness but no focal weakness. She was evaluated in the hospital by PT and was doing well, but slow in her movements.  She is to see a neurologist on follow-up this week.  There have been no issues of bleeding, INR was drawn today with result of 2.7.  No Known Allergies  Current Outpatient Prescriptions  Medication Sig Dispense Refill  . aspirin EC 325 MG EC tablet Take 1 tablet (325 mg total) by mouth daily.  30 tablet  0  . atorvastatin (LIPITOR) 10 MG tablet Take 2 tablets (20 mg total) by mouth daily.  60 tablet  0  . buPROPion (WELLBUTRIN XL) 150 MG 24 hr tablet Take 150 mg by mouth daily.        . Calcium Citrate-Vitamin D (CITRACAL PETITES/VITAMIN D) 200-250 MG-UNIT TABS Take 2 tablets by mouth daily.        . Cholecalciferol (VITAMIN D PO) Take 1 capsule by mouth daily.      Marland Kitchen levothyroxine (SYNTHROID, LEVOTHROID) 88 MCG tablet Take 88 mcg by mouth daily. 1 tablet daily, except 2 tablets on Sunday      . losartan (COZAAR) 100 MG tablet Take 50 mg by mouth daily.        .  Multiple Vitamin (MULITIVITAMIN WITH MINERALS) TABS Take 1 tablet by mouth daily.      Marland Kitchen omeprazole (PRILOSEC) 20 MG capsule Take 20 mg by mouth 2 (two) times daily with a meal.        . Vitamins C E 500-400 MG-UNIT CAPS Take 1 capsule by mouth daily.        Marland Kitchen warfarin (COUMADIN) 5 MG tablet Take 1 tablet (5 mg total) by mouth daily.  30 tablet  0    Past Medical History  Diagnosis Date  . Rheumatic heart disease     mild AS/AI and mild MS/trivial MR  . Gastroesophageal reflux disease     +gastoparesis  . Hyperlipidemia   . Hypertension   . Heart murmur   . Stroke   . H/O cardiac catheterization 1993  . Type 2 diabetes mellitus     "120 at fasting" - prediabetic  . Asthma     pt denies this  . Chronic kidney disease     "GFR is elevated all the time"  . Breast carcinoma 1995    1995  . Depression   . Shortness of breath     on exertion  . Diabetes mellitus     Past Surgical History  Procedure Date  . Mitral valve surgery [facility], closed mitral  valvulotomy by finger fracture  . Cardiac catheterization   . Dilation and curettage of uterus   . Mastectomy 1995    lumpectomy  . Tubal ligation 1973  . Tee without cardioversion 01/24/2012    Procedure: TRANSESOPHAGEAL ECHOCARDIOGRAM (TEE);  Surgeon: Sara Bunting, MD;  Location: [facility] ENDOSCOPY;  Service: Cardiovascular;  Laterality: N/A;    JYN:WGNFAO of systems complete and found to be negative unless listed above  PHYSICAL EXAM BP 119/66  Pulse 68  Resp 16  Ht 5\' 2"  (1.575 m)  Wt 170 lb (77.111 kg)  BMI 31.09 kg/m2  General: Well developed, well nourished, in no acute distress Head: Eyes PERRLA, No xanthomas.   Normal cephalic and atramatic  Lungs: Clear bilaterally to auscultation and percussion. Heart: HRRR S1 S2,1/6 systolic murmur.  Pulses are 2+ & equal.            No carotid bruit. No JVD.  No abdominal bruits. No femoral bruits. Abdomen: Bowel sounds are positive, abdomen soft and non-tender  without masses or                  Hernia's noted. Msk:  Back normal, normal gait. Normal strength and tone for age. Extremities: No clubbing, cyanosis or edema.  DP +1 Neuro: Alert and oriented X 3. No focal weakness noted per exam.Follows commands. Psych:  Flat affect, responds appropriately, but prefers to read a book instead of answering questions or engaging with me.. Husband answers questions for her.     ASSESSMENT AND PLAN

## 2012-01-30 NOTE — Assessment & Plan Note (Signed)
Blood pressure is well controlled at present. She is to continue losartan 50 mg daily.

## 2012-01-30 NOTE — Assessment & Plan Note (Signed)
Newly diagnosed embolic CVA in the setting of abnormal TEE showing LA appendage thrombus.  She has essentially recovered from this,but has generalized weakness and a flat, affect. She does not engage in conversation preferring to read a book, and only answers when her husband speaks to her. He says that she cooks and is able to walk around the house, but is still having trouble climbing stairs. She has no focal weakness.I have asked for home PT evaluation to assist in help if necessary.   She is going to follow-up with neurologist this week. She will continue on coumadin with INR checks in [place]. She will see Dr.Rothbart in one month.

## 2012-01-30 NOTE — Patient Instructions (Signed)
**Note De-Identified  Obfuscation** Your physician recommends that you continue on your current medications as directed. Please refer to the Current Medication list given to you today.  Your provider recommends that a home health nurse come into your home for evaluation.Marland KitchenMarland KitchenMarland KitchenMarland Kitchen we will arrange  Your provider recommends that you complete 3 hemoccult cards and return to this office.  Your physician recommends that you schedule a follow-up appointment in: 1 month

## 2012-01-30 NOTE — Assessment & Plan Note (Signed)
She is newly started on atorvastatin 10 mg daily. She will have follow-up lipids in 3 months for ongoing assessment.

## 2012-02-01 ENCOUNTER — Encounter (INDEPENDENT_AMBULATORY_CARE_PROVIDER_SITE_OTHER): Payer: Medicare Other

## 2012-02-01 DIAGNOSIS — Z7901 Long term (current) use of anticoagulants: Secondary | ICD-10-CM

## 2012-02-02 ENCOUNTER — Other Ambulatory Visit: Payer: Self-pay

## 2012-02-02 ENCOUNTER — Encounter: Payer: Self-pay | Admitting: *Deleted

## 2012-02-02 DIAGNOSIS — Z7901 Long term (current) use of anticoagulants: Secondary | ICD-10-CM

## 2012-02-03 ENCOUNTER — Ambulatory Visit (INDEPENDENT_AMBULATORY_CARE_PROVIDER_SITE_OTHER): Payer: Medicare Other | Admitting: *Deleted

## 2012-02-03 DIAGNOSIS — I6350 Cerebral infarction due to unspecified occlusion or stenosis of unspecified cerebral artery: Secondary | ICD-10-CM

## 2012-02-03 DIAGNOSIS — Z7901 Long term (current) use of anticoagulants: Secondary | ICD-10-CM

## 2012-02-03 DIAGNOSIS — I639 Cerebral infarction, unspecified: Secondary | ICD-10-CM

## 2012-02-03 LAB — POCT INR: INR: 3.5

## 2012-02-08 ENCOUNTER — Ambulatory Visit (HOSPITAL_COMMUNITY)
Admission: RE | Admit: 2012-02-08 | Discharge: 2012-02-08 | Disposition: A | Discharge status: Home / Self Care | Payer: Medicare Other | Source: Ambulatory Visit | Attending: Internal Medicine | Admitting: Internal Medicine

## 2012-02-08 DIAGNOSIS — M25569 Pain in unspecified knee: Secondary | ICD-10-CM | POA: Insufficient documentation

## 2012-02-08 DIAGNOSIS — I10 Essential (primary) hypertension: Secondary | ICD-10-CM | POA: Insufficient documentation

## 2012-02-08 DIAGNOSIS — IMO0001 Reserved for inherently not codable concepts without codable children: Secondary | ICD-10-CM | POA: Insufficient documentation

## 2012-02-08 DIAGNOSIS — E119 Type 2 diabetes mellitus without complications: Secondary | ICD-10-CM | POA: Insufficient documentation

## 2012-02-08 NOTE — Evaluation (Signed)
Physical Therapy Evaluation  Patient Details  Name: [name] MRN: [id number] Date of Birth: [birth year]  Today's Date: 02/08/2012 Time: 0803-0843 PT Time Calculation (min): 40 min  Visit#: 1  of 8   Re-eval: Assessment Diagnosis: CVA Next MD Visit:  (02/21/12) Prior Therapy: acute  Authorization: medicare needs G-codes   Past Medical History:  Past Medical History  Diagnosis Date  . Rheumatic heart disease     mild AS/AI and mild MS/trivial MR  . Gastroesophageal reflux disease     +gastoparesis  . Hyperlipidemia   . Hypertension   . Heart murmur   . Stroke   . H/O cardiac catheterization 1993  . Type 2 diabetes mellitus     "120 at fasting" - prediabetic  . Asthma     pt denies this  . Chronic kidney disease     "GFR is elevated all the time"  . Breast carcinoma 1995    1995  . Depression   . Shortness of breath     on exertion  . Diabetes mellitus    Past Surgical History:  Past Surgical History  Procedure Date  . Mitral valve surgery [facility], closed mitral valvulotomy by finger fracture  . Cardiac catheterization   . Dilation and curettage of uterus   . Mastectomy 1995    lumpectomy  . Tubal ligation 1973  . Tee without cardioversion 01/24/2012    Procedure: TRANSESOPHAGEAL ECHOCARDIOGRAM (TEE);  Surgeon: Lewayne Bunting, MD;  Location: [facility] ENDOSCOPY;  Service: Cardiovascular;  Laterality: N/A;    Subjective Symptoms/Limitations Symptoms: [name] states that she noticed that she had heavy legs on 4/17 on 4/18 her legs gave away.  She did not improve so she went to the ER on 4/21 it was noted that she had a stroke.  She is now being referred to OP PT to improve her balance How long can you sit comfortably?: no problem How long can you stand comfortably?: The patient has stood for 15 minutes. How long can you walk comfortably?: The patient has been walking at the most for 10 minutes.  Pain Assessment Currently in Pain?: Yes   Prior  Functioning  Home Living Lives With: Spouse Home Access: Stairs to enter Entrance Stairs-Number of Steps: 4 Entrance Stairs-Rails: Right Home Layout: Two level Prior Function Able to Take Stairs?: Reciprically Driving: Yes Vocation: Retired [name]: Hobbies-yes (Comment) Comments: golf,gardening  Cognition/Observation Cognition Orientation Level: Oriented X4  Sensation/Coordination/Flexibility/Functional Tests Functional Tests Functional Tests: ABC 59%  Assessment RLE Strength Right Hip Flexion: 5/5 Right Hip Extension: 3-/5 Right Hip ABduction: 3/5 Right Hip ADduction: 3/5 Right Knee Flexion: 5/5 Right Knee Extension: 5/5 Right Ankle Dorsiflexion: 5/5 LLE Strength Left Hip Flexion: 4/5 Left Hip Extension: 3-/5 Left Hip ABduction: 3+/5 Left Hip ADduction: 3/5 Left Knee Flexion: 3/5 Left Knee Extension: 4/5 Left Ankle Dorsiflexion: 5/5  Exercise/Treatments Mobility/Balance  Berg Balance Test Total Score: 51/56    DGI:  19     Physical Therapy Assessment and Plan PT Assessment and Plan Clinical Impression Statement: Pt radilolgical testing revealing an old lacunar infart and possible embolic occlusion of the R ACA.  She has high balance deficits and LE deficits which will benefit from skilled PT to return pt back to previous funtionsl level and improve her quality of life. Pt will benefit from skilled therapeutic intervention in order to improve on the following deficits: Decreased activity tolerance;Decreased balance;Decreased strength Rehab Potential: Excellent PT Frequency: Min 2X/week PT Duration: 4 weeks  PT Plan: High balance activity; balance master, rocker board, stairs; tandem on the balance beam, 1-15 retro on balance beam, Warrior poses, ...    Goals Home Exercise Program Pt will Perform Home Exercise Program: Independently PT Short Term Goals Time to Complete Short Term Goals: 2 weeks PT Short Term Goal 1: Pt to feel secure walking up and down  steps. PT Short Term Goal 2: Berg to be 54 PT Short Term Goal 3: mm strength improved 1/2 grade PT Short Term Goal 4: Pt to have played 9 holes of golf PT Long Term Goals PT Long Term Goal 1: improve mm strength by one grade PT Long Term Goal 2: ABC scale increased to at least 75%  Problem List Patient Active Problem List  Diagnoses  . RHEUMATIC HEART DISEASE  . Breast carcinoma  . Asthma  . Gastroesophageal reflux disease  . Hyperlipidemia  . Hypertension  . CVA (cerebral vascular accident)  . DM type 2 (diabetes mellitus, type 2)  . Hypothyroidism  . Encounter for long-term (current) use of anticoagulants    PT - End of Session Equipment Utilized During Treatment: Gait belt Activity Tolerance: Patient tolerated treatment well General Behavior During Session: [facility] for tasks performed Cognition: [facility] for tasks performed PT Plan of Care PT Home Exercise Plan: given for strengthening of weak mm. Consulted and Agree with Plan of Care: Patient  GP  Functional Reporting Modifier  Current Status  [phone] - Mobility: Walking & Moving Around CK - At least 40% but less than 60% impaired, limited or restricted  Goal Status  [id number] - Mobility: [place] & Moving Around [facility] - 0 percent impaired, limited or restricted  I chose CK due to the ABC scale.  Pt is very active and should be able to return to full function. [name],CINDY 02/08/2012, 11:43 AM  Physician Documentation Your signature is required to indicate approval of the treatment plan as stated above.  Please sign and either send electronically or make a copy of this report for your files and return this physician signed original.   Please mark one 1.__approve of plan  2. ___approve of plan with the following conditions.   ______________________________                                                          _____________________ Physician Signature                                                                                                              Date

## 2012-02-09 ENCOUNTER — Ambulatory Visit (HOSPITAL_COMMUNITY)
Admission: RE | Admit: 2012-02-09 | Discharge: 2012-02-09 | Disposition: A | Discharge status: Home / Self Care | Payer: Medicare Other | Source: Ambulatory Visit | Attending: Internal Medicine | Admitting: Internal Medicine

## 2012-02-09 ENCOUNTER — Ambulatory Visit (INDEPENDENT_AMBULATORY_CARE_PROVIDER_SITE_OTHER): Payer: Medicare Other | Admitting: *Deleted

## 2012-02-09 DIAGNOSIS — Z7901 Long term (current) use of anticoagulants: Secondary | ICD-10-CM

## 2012-02-09 DIAGNOSIS — I6350 Cerebral infarction due to unspecified occlusion or stenosis of unspecified cerebral artery: Secondary | ICD-10-CM

## 2012-02-09 DIAGNOSIS — I639 Cerebral infarction, unspecified: Secondary | ICD-10-CM

## 2012-02-09 NOTE — Progress Notes (Signed)
Physical Therapy Treatment Patient Details  Name: [name] MRN: [id number] Date of Birth: 19-Nov-1945  Today's Date: 02/09/2012 Time: 4098-1191 PT Time Calculation (min): 40 min Visit#: 2  of 8   Re-eval: 02/22/12 Charges: NMR x 32'   Subjective: Symptoms/Limitations Symptoms: Pt states that she had to sit for a long period today and when she stood up her legs felt weak. Pain Assessment Currently in Pain?: No/denies   Exercise/Treatments Standing Other Standing Knee Exercises: #1-15 B Balance Exercises Tandem Walking: 1 round trip;Limitations (1RT on floor; 1RT on foam) Retro Gait: 1 round trip;Limitations Retro Gait Limitations: Tandem Single Limb Stance: Right;Left;Limitations Single Limb Stance Limitations: B 5" max of 3 Balance Master: Limits for Stability: Test Balance Master: Static: Test  Physical Therapy Assessment and Plan PT Assessment and Plan Clinical Impression Statement: Began NMR training this session. Pt has minimal difficulty with tandem(retro and forward) and # 1-15. Pt requires 1UE assist to complete Dentist. Pt able to complete SLS for 5" B. Pt completes stairs with minimal difficulty. Pt states that she tends to become fatigued suddenly when climbing stairs at home. Pt is without complaint throughout session. PT Plan: Continue to progress per PT POC. Begin wall bumps and other high level balance activities next session. D/C #1-15 and tandems gait as these are not challenging enough for pt.     Problem List Patient Active Problem List  Diagnoses  . RHEUMATIC HEART DISEASE  . Breast carcinoma  . Asthma  . Gastroesophageal reflux disease  . Hyperlipidemia  . Hypertension  . CVA (cerebral vascular accident)  . DM type 2 (diabetes mellitus, type 2)  . Hypothyroidism  . Encounter for long-term (current) use of anticoagulants    PT - End of Session Activity Tolerance: Patient tolerated treatment well General Behavior During Session: [facility]  for tasks performed Cognition: [facility] for tasks performed   Seth Bake, PTA 02/09/2012, 3:35 PM

## 2012-02-13 ENCOUNTER — Ambulatory Visit (HOSPITAL_COMMUNITY)
Admission: RE | Admit: 2012-02-13 | Discharge: 2012-02-13 | Disposition: A | Discharge status: Home / Self Care | Payer: Medicare Other | Source: Ambulatory Visit | Attending: Internal Medicine | Admitting: Internal Medicine

## 2012-02-13 NOTE — Progress Notes (Signed)
Physical Therapy Treatment Patient Details  Name: [name] MRN: [id number] Date of Birth: 1946/08/27  Today's Date: 02/13/2012 Time: 1478-2956 PT Time Calculation (min): 41 min Visit#: 3  of 8   Re-eval: 02/22/12  Authorization: medicare needs G-codes  Authorization Time Period:    Authorization Visit#: 3  of 10    Charges: Therex x 8' NMR x30'  Subjective: Symptoms/Limitations Symptoms: My legs have been sore. Pain Assessment Currently in Pain?: No/denies Pain Score: 0-No pain   Exercise/Treatments Aerobic Tread Mill: 8'@1 .2 to improve LE endurance Standing Rocker Board: 2 minutes (R/L A/P UE assist as needed) Balance Exercises Tread Mill: 8'@1 .2 to improve LE endurance Tandem Walking: 1 round trip;Limitations Tandem Walking Limitations: on foam Single Limb Stance: Right;Left;Limitations Single Limb Stance Limitations: B 6" max of 3 Balance Master: Limits for Stability: 2:02 with ! figner assist Balance Master: Static: 2x1' Gait with Head Turns (Round Trips): 1 RT R/L up/down Wall Bumps: Limitations Wall Bumps Limitations: hips and shoulders  Physical Therapy Assessment and Plan PT Assessment and Plan Clinical Impression Statement: Began TM walking this session to improve endurance as pt says that she is easily fatigued. Began gait with head turns. Pt appears to have most difficulty with keeping balance when head is down. Pt states that when she is washing her hair she tends to lose her balance when she moves her head around. Pt reports that her legs felt fatigued but no pain at end of session. PT Plan: Continue to progress per PT POC.     Problem List Patient Active Problem List  Diagnoses  . RHEUMATIC HEART DISEASE  . Breast carcinoma  . Asthma  . Gastroesophageal reflux disease  . Hyperlipidemia  . Hypertension  . CVA (cerebral vascular accident)  . DM type 2 (diabetes mellitus, type 2)  . Hypothyroidism  . Encounter for long-term (current) use of  anticoagulants    PT - End of Session Activity Tolerance: Patient tolerated treatment well General Behavior During Session: [facility] for tasks performed Cognition: [facility] for tasks performed  Seth Bake, PTA 02/13/2012, 9:13 AM

## 2012-02-15 ENCOUNTER — Ambulatory Visit (HOSPITAL_COMMUNITY)
Admission: RE | Admit: 2012-02-15 | Discharge: 2012-02-15 | Disposition: A | Discharge status: Home / Self Care | Payer: Medicare Other | Source: Ambulatory Visit | Attending: Internal Medicine | Admitting: Internal Medicine

## 2012-02-15 NOTE — Progress Notes (Addendum)
Physical Therapy Treatment Patient Details  Name: [name] MRN: [id number] Date of Birth: [birth year]  Today's Date: 02/15/2012 Time: 4098-1191 PT Time Calculation (min): 40 min Visit#: 4  of 8   Re-eval: 02/22/12 Charges: NMR x 30' Gait training x 8'   Subjective: Symptoms/Limitations Symptoms: Pt states she was just a little sore after last session. Pain Assessment Currently in Pain?: No/denies Pain Score: 0-No pain   Exercise/Treatments Aerobic Tread Mill: 8' @.45 gait trainer to improve stride length Standing Other Standing Knee Exercises: Standing on foam with head turns up/down R/L Balance Exercises Tread Mill: 8' @.45 gait trainer to inmprove stride length Tandem Walking: 1 round trip Tandem Walking Limitations: on floor to work on walking with decreased BOS Rotation with Cones: 1 RT B on foam Single Limb Stance: Right;Left;Limitations Single Limb Stance Limitations: L10" R7" Balance Master: Limits for Stability: 2:26 with 1 figner assist Balance Master: Static: 2x1' Gait with Head Turns (Round Trips): 1 RT R/L up/down Wall Bumps: 10 reps Wall Bumps Limitations: hips and shoulders Balance Poses Warrior I: 2 reps;30 seconds;Limitations Warrior I Limitations: Bilaterally    Physical Therapy Assessment and Plan PT Assessment and Plan Clinical Impression Statement: Gait training completed on TM this session to improve stride length and to decrease gait deviations. Pt requires vc's to increase stride length and knee flexion. Marland Kitchen Pt presents with scissoring and circumduction at times with TM walking. Pt is unable to complete TM without fingertip assistance for balance. Pt tends to widen her BOS when her balance is challenged by head turns. Began tandem gait on solid surface again to challenge balance by decreasing BOS. Began cone rotation on foam with vc's to avoid holding on to table. Pt tends to try to complete all exercises quickly and requires frequent vc's to slow  down. Pt is without complaint throughout session. PT Plan: Continue to progress per PT POC.     Problem List Patient Active Problem List  Diagnoses  . RHEUMATIC HEART DISEASE  . Breast carcinoma  . Asthma  . Gastroesophageal reflux disease  . Hyperlipidemia  . Hypertension  . CVA (cerebral vascular accident)  . DM type 2 (diabetes mellitus, type 2)  . Hypothyroidism  . Encounter for long-term (current) use of anticoagulants    PT - End of Session Activity Tolerance: Patient tolerated treatment well General Behavior During Session: [facility] for tasks performed Cognition: [facility] for tasks performed    Seth Bake, PTA 02/15/2012, 9:08 AM

## 2012-02-20 ENCOUNTER — Ambulatory Visit (INDEPENDENT_AMBULATORY_CARE_PROVIDER_SITE_OTHER): Payer: Medicare Other | Admitting: *Deleted

## 2012-02-20 ENCOUNTER — Ambulatory Visit (HOSPITAL_COMMUNITY)
Admission: RE | Admit: 2012-02-20 | Discharge: 2012-02-20 | Disposition: A | Discharge status: Home / Self Care | Payer: Medicare Other | Source: Ambulatory Visit | Attending: Internal Medicine | Admitting: Internal Medicine

## 2012-02-20 DIAGNOSIS — I639 Cerebral infarction, unspecified: Secondary | ICD-10-CM

## 2012-02-20 DIAGNOSIS — I6350 Cerebral infarction due to unspecified occlusion or stenosis of unspecified cerebral artery: Secondary | ICD-10-CM

## 2012-02-20 DIAGNOSIS — Z7901 Long term (current) use of anticoagulants: Secondary | ICD-10-CM

## 2012-02-20 LAB — POCT INR: INR: 2.8

## 2012-02-20 NOTE — Progress Notes (Addendum)
Physical Therapy Treatment Patient Details  Name: [name] MRN: [id number] Date of Birth: 01-24-1946  Today's Date: 02/20/2012 Time: 0800-0840 PT Time Calculation (min): 40 min Visit#: 5  of 8   Re-eval: 02/22/12 Charges: NMR x 38'  Authorization: medicare needs G-codes   Authorization Time Period:    Authorization Visit#: 5  of 10    Subjective: Symptoms/Limitations Symptoms: Pt states that she is having a little low back soreness but no realy pain. Pt also states that she feels her balance has improved. Pain Assessment Currently in Pain?: No/denies   Exercise/Treatments  Balance Exercises Tandem Walking: 1 round trip Tandem Walking Limitations: on floor to work on walking with decreased BOS; 2RT on foam Single Limb Stance: Right;Left;Limitations Single Limb Stance Limitations: L9" R6" Balance Master: Limits for Stability: 1:09 w/ finer tip assist Balance Master: Static: 2x1' Gait with Head Turns (Round Trips): 2 RT R/L up/down Wall Bumps: 10 reps Wall Bumps Limitations: hips and shoulders March on foam x 15 Rocker board 2' A/P R/L  Engineer, maintenance I: 2 reps;30 seconds;Limitations Warrior I Limitations: Stage manager II: 1 rep;30 seconds;Limitations Warrior II Limitations: Bilaterally  Physical Therapy Assessment and Plan PT Assessment and Plan Clinical Impression Statement: Pt presents with improved stability with balance master this session. Pt with decreased LOB with tandem gait on foam. Began warrior II stance with moderate difficulty. Pt appears to have increased LOB with  cervical extension. Pt has no LOB with up and down eye movements. Pt is without complaint throughout session. PT Plan: Continue per PT POC. Address goals next session.     Problem List Patient Active Problem List  Diagnoses  . RHEUMATIC HEART DISEASE  . Breast carcinoma  . Asthma  . Gastroesophageal reflux disease  . Hyperlipidemia  . Hypertension  . CVA (cerebral  vascular accident)  . DM type 2 (diabetes mellitus, type 2)  . Hypothyroidism  . Encounter for long-term (current) use of anticoagulants    PT - End of Session Activity Tolerance: Patient tolerated treatment well General Behavior During Session: [facility] for tasks performed Cognition: [facility] for tasks performed    Seth Bake, PTA 02/20/2012, 8:57 AM

## 2012-02-22 ENCOUNTER — Ambulatory Visit (HOSPITAL_COMMUNITY)
Admission: RE | Admit: 2012-02-22 | Discharge: 2012-02-22 | Disposition: A | Discharge status: Home / Self Care | Payer: Medicare Other | Source: Ambulatory Visit | Attending: Internal Medicine | Admitting: Internal Medicine

## 2012-02-22 NOTE — Progress Notes (Signed)
Physical Therapy Treatment Patient Details  Name: [name] MRN: [id number] Date of Birth: [birth year]  Today's Date: 02/22/2012 Time: 0805-0845 PT Time Calculation (min): 40 min Visit#: 6  of 8   Re-eval: 03/02/12 Charges:  NMR 39'    Subjective: Symptoms/Limitations Symptoms: Pt. reports no soreness or pain today.  States she is doing well. Pain Assessment Currently in Pain?: No/denies   Exercise/Treatments Balance Exercises Tread Mill: 6' @.55 gait trainer to inmprove stride length Tandem Walking: 1 round trip Retro Gait: 1 round trip Retro Gait Limitations: Tandem March on Foam/Wedge: 15 reps Single Limb Stance: Right;Left;Limitations Single Limb Stance Limitations: L14" R6" Balance Master: Limits for Stability: 1:19 mod difficulty 1 HHA Balance Master: Static: 2' Gait with Head Turns (Round Trips): 2 RT R/L up/down Wall Bumps: Limitations Wall Bumps Limitations: 12 reps hips and shoulders Balance Beam: 2RT tandem, 1RT side stepping Balance Poses Warrior I: 2 reps;30 seconds;Limitations Warrior I Limitations: Stage manager II: 1 rep;15 seconds Warrior II Limitations: Bilaterally    Physical Therapy Assessment and Plan PT Assessment and Plan Clinical Impression Statement: Able to increase difficulty of balance master this session and added side stepping on balance beam to challenge stabiltiy.  Began warrior II stance with moderate difficulty, unable to hold stance greater than 15 seconds.   Pt appears to have increased LOB with cervical extension and difficulty finding R/L when called during gait. PT Plan: Continue remaining 2 visits then re-evaluate.     Problem List Patient Active Problem List  Diagnoses  . RHEUMATIC HEART DISEASE  . Breast carcinoma  . Asthma  . Gastroesophageal reflux disease  . Hyperlipidemia  . Hypertension  . CVA (cerebral vascular accident)  . DM type 2 (diabetes mellitus, type 2)  . Hypothyroidism  . Encounter for  long-term (current) use of anticoagulants    PT - End of Session Equipment Utilized During Treatment: Gait belt Activity Tolerance: Patient tolerated treatment well General Behavior During Session: [facility] for tasks performed Cognition: [facility] for tasks performed  GP No functional reporting required  Tola Meas B. Bascom Levels, PTA 02/22/2012, 8:50 AM

## 2012-02-29 ENCOUNTER — Ambulatory Visit (HOSPITAL_COMMUNITY)
Admission: RE | Admit: 2012-02-29 | Discharge: 2012-02-29 | Disposition: A | Discharge status: Home / Self Care | Payer: Medicare Other | Source: Ambulatory Visit | Attending: Internal Medicine | Admitting: Internal Medicine

## 2012-02-29 NOTE — Progress Notes (Signed)
Physical Therapy Treatment Patient Details  Name: [name] MRN: [id number] Date of Birth: 05/10/1946  Today's Date: 02/29/2012 Time: 0800-0845 PT Time Calculation (min): 45 min Visit#: 7  of 8   Re-eval: 03/02/12 Charges:  NMR 35', self care 8'    Authorization: medicare needs G-codes   Authorization Time Period:    Authorization Visit#: 7  of 10    Subjective: Symptoms/Limitations Symptoms: Pt. states her back has been hurting for a couple days, unsure the mechanism of pain. Pt. points to Lower L lumbar area. Began session with POE/heat to ease discomfort prior to balance activities.  Pt does report playing 18 holes of golf yesterday. Pain Assessment Currently in Pain?: Yes Pain Score:   4 Pain Location: Back Pain Orientation: Left;Lower   Exercise/Treatments Balance Exercises Tread Mill: 6' @.75 gait trainer to inmprove stride length Tandem Walking: 2 round trips Retro Gait: 1 round trip Retro Gait Limitations: Tandem March on Foam/Wedge: 15 reps Single Limb Stance: Right;Left;Limitations Single Limb Stance Limitations: L24" R14" Balance Master: Limits for Stability: 1:28 mod difficulty 1 HHA Balance Master: Static: 2' stab 6 1 finger as needed Gait with Head Turns (Round Trips): 2 RT R/L up/down Wall Bumps: Limitations Wall Bumps Limitations: 12 reps hips and shoulders Balance Beam: 2RT tandem, 2RT side stepping    Modalities Modalities: Moist Heat (no charge) Moist Heat Therapy Number Minutes Moist Heat: 8 Minutes Moist Heat Location: Other (comment) (Low back while lying POE)  Physical Therapy Assessment and Plan PT Assessment and Plan Clinical Impression Statement: Began session with POC/moist heat to help decrease discomfort prior to beginning balance activities.  Able to increase speed on gait trainer without difficulty.  Pt. reports she has returned to all ADL's and recreational activities (golf).  Pt. has began walking program at home to increase her  endurance.  Pt. has progressed well with therapy.  Pt. Educated on exercises to help increase core strength to help with back pain (kegals and TrA contractions, prone exercises). PT Plan: Re-evaluate next visit; has MD appt. following therapy session.     Problem List Patient Active Problem List  Diagnoses  . RHEUMATIC HEART DISEASE  . Breast carcinoma  . Asthma  . Gastroesophageal reflux disease  . Hyperlipidemia  . Hypertension  . CVA (cerebral vascular accident)  . DM type 2 (diabetes mellitus, type 2)  . Hypothyroidism  . Encounter for long-term (current) use of anticoagulants    PT - End of Session Activity Tolerance: Patient tolerated treatment well General Behavior During Session: [facility] for tasks performed Cognition: [facility] for tasks performed  GP No functional reporting required  Lurena Nida, PTA/CLT 02/29/2012, 8:50 AM

## 2012-03-02 ENCOUNTER — Encounter: Payer: Self-pay | Admitting: Cardiology

## 2012-03-02 ENCOUNTER — Ambulatory Visit (INDEPENDENT_AMBULATORY_CARE_PROVIDER_SITE_OTHER): Payer: Medicare Other | Admitting: Cardiology

## 2012-03-02 ENCOUNTER — Ambulatory Visit (HOSPITAL_COMMUNITY)
Admission: RE | Admit: 2012-03-02 | Discharge: 2012-03-02 | Disposition: A | Discharge status: Home / Self Care | Payer: Medicare Other | Source: Ambulatory Visit | Attending: Internal Medicine | Admitting: Internal Medicine

## 2012-03-02 VITALS — BP 122/70 | HR 75 | Ht 62.0 in | Wt 172.0 lb

## 2012-03-02 DIAGNOSIS — E785 Hyperlipidemia, unspecified: Secondary | ICD-10-CM

## 2012-03-02 DIAGNOSIS — I639 Cerebral infarction, unspecified: Secondary | ICD-10-CM

## 2012-03-02 DIAGNOSIS — Z7901 Long term (current) use of anticoagulants: Secondary | ICD-10-CM

## 2012-03-02 DIAGNOSIS — R7301 Impaired fasting glucose: Secondary | ICD-10-CM | POA: Insufficient documentation

## 2012-03-02 DIAGNOSIS — I10 Essential (primary) hypertension: Secondary | ICD-10-CM

## 2012-03-02 DIAGNOSIS — I6340 Cerebral infarction due to embolism of unspecified cerebral artery: Secondary | ICD-10-CM

## 2012-03-02 MED ORDER — ATORVASTATIN CALCIUM 10 MG PO TABS: 10.0000 mg | ORAL_TABLET | Freq: Every day | ORAL | Status: DC

## 2012-03-02 NOTE — Patient Instructions (Signed)
Your physician recommends that you schedule a follow-up appointment in: 4 months  Your physician has recommended you make the following change in your medication: DECREASE Atorvastatin (Lipitor) to 10 mg daily  Stool cards x 3 and return to [place] as soon as possible

## 2012-03-02 NOTE — Assessment & Plan Note (Addendum)
She has done well with initial warfarin therapy without any abnormal bleeding.  She has developed excessive gas that she attributes to warfarin.  We will try to adjust her dose of atorvastatin to determine if that is contributing to her GI problems.  If not, a trial of an alternate oral anticoagulant may be necessary.  Monitoring of CBC and stool Hemoccults will be maintained to exclude occult GI blood loss.

## 2012-03-02 NOTE — Progress Notes (Signed)
Patient ID: [name], female   DOB: 1946/01/09, 66 y.o.   MRN: [id number]  HPI: Scheduled visit following admission to [facility] for the acute onset of neurologic symptoms.  A right frontal CVA was identified, likely the result of a cardiogenic embolus.  Had carotid ultrasound studies were negative.  A possible left atrial appendage thrombus was identified on TEE.  She has done well with anticoagulation utilizing warfarin.  Prior to Admission medications   Medication Sig Start Date End Date Taking? Authorizing Provider  atorvastatin (LIPITOR) 10 MG tablet Take 1 tablet (10 mg total) by mouth daily. 03/02/12  Yes Kathlen Brunswick, MD  buPROPion (WELLBUTRIN XL) 150 MG 24 hr tablet Take 150 mg by mouth daily.     Yes Historical Provider, MD  cetirizine (ZYRTEC) 10 MG tablet Take 10 mg by mouth daily.   Yes Historical Provider, MD  Cholecalciferol (VITAMIN D PO) Take 1 capsule by mouth daily.   Yes Historical Provider, MD  levothyroxine (SYNTHROID, LEVOTHROID) 88 MCG tablet Take 88 mcg by mouth daily. 1 tablet daily, except 2 tablets on Sunday   Yes Historical Provider, MD  losartan (COZAAR) 100 MG tablet Take 50 mg by mouth daily.     Yes Historical Provider, MD  Multiple Vitamin (MULITIVITAMIN WITH MINERALS) TABS Take 1 tablet by mouth daily.   Yes Historical Provider, MD  omeprazole (PRILOSEC) 20 MG capsule Take 20 mg by mouth 2 (two) times daily with a meal.     Yes Historical Provider, MD  warfarin (COUMADIN) 5 MG tablet Take 1 tablet (5 mg total) by mouth daily. 01/24/12 01/23/13 Yes Sorin Luanne Bras, MD   No Known Allergies    Past medical history, social history, and family history reviewed and updated.  ROS: Denies palpitations, difficulties with speech, any significant focal weakness, sensory abnormalities, orthopnea, PND or syncope.  She has chronic class II dyspnea on exertion.  Mild edema in the ankles is occasionally noted.  PHYSICAL EXAM: BP 122/70  Pulse 75  Ht 5\' 2"   (1.575 m)  Wt 78.019 kg (172 lb)  BMI 31.46 kg/m2  SpO2 97%  General-Well developed; no acute distress Body habitus-proportionate weight and height Neck-No JVD; no carotid bruits Lungs-clear lung fields; resonant to percussion Cardiovascular-normal PMI; normal S1 and S2; regular rhythm; Grade 2-3/6 basilar systolic ejection murmur radiating to the carotids; no diastolic murmur appreciated; no opening snap Abdomen-normal bowel sounds; soft and non-tender without masses or organomegaly Musculoskeletal-No deformities, no cyanosis or clubbing Neurologic-Normal cranial nerves; symmetric strength and tone Skin-Warm, no significant lesions Extremities-distal pulses intact; 1+ ankle edema  ASSESSMENT AND PLAN:  [place] Bing, MD 03/02/2012 12:12 PM

## 2012-03-02 NOTE — Assessment & Plan Note (Signed)
The patient appears to have suffered a thromboembolic event despite our failure to identify any atrial arrhythmias.  The thrombus may have formed simply due to the presence of mitral stenosis, or she could be suffering asymptomatic spells of paroxysmal atrial fibrillation.  In either case, warfarin should provide adequate protection from a recurrent event.

## 2012-03-02 NOTE — Assessment & Plan Note (Signed)
Blood pressure control has been generally good.  If additional therapy is needed, low dose diuretic would be appropriate, as this would likely also improve the patient's mild pedal edema.

## 2012-03-02 NOTE — Progress Notes (Deleted)
Name: [name]    DOB: 1946/05/22  Age: 66 y.o.  MR#: [id number]       PCP:  Carylon Perches, MD, MD      Insurance: @PAYORNAME @   CC:    Chief Complaint  Patient presents with  . No complaints/ SP CVA    Med list reviewed/TC    VS BP 122/70  Pulse 75  Ht 5\' 2"  (1.575 m)  Wt 172 lb (78.019 kg)  BMI 31.46 kg/m2  SpO2 97%  Weights Current Weight  03/02/12 172 lb (78.019 kg)  01/30/12 170 lb (77.111 kg)  01/25/12 164 lb (74.39 kg)    Blood Pressure  BP Readings from Last 3 Encounters:  03/02/12 122/70  01/30/12 119/66  01/25/12 144/59     Admit date:  (Not on file) Last encounter with RMR:  11/10/2011   Allergy No Known Allergies  Current Outpatient Prescriptions  Medication Sig Dispense Refill  . atorvastatin (LIPITOR) 10 MG tablet Take 2 tablets (20 mg total) by mouth daily.  60 tablet  0  . buPROPion (WELLBUTRIN XL) 150 MG 24 hr tablet Take 150 mg by mouth daily.        . cetirizine (ZYRTEC) 10 MG tablet Take 10 mg by mouth daily.      . Cholecalciferol (VITAMIN D PO) Take 1 capsule by mouth daily.      Marland Kitchen levothyroxine (SYNTHROID, LEVOTHROID) 88 MCG tablet Take 88 mcg by mouth daily. 1 tablet daily, except 2 tablets on Sunday      . losartan (COZAAR) 100 MG tablet Take 50 mg by mouth daily.        . Multiple Vitamin (MULITIVITAMIN WITH MINERALS) TABS Take 1 tablet by mouth daily.      Marland Kitchen omeprazole (PRILOSEC) 20 MG capsule Take 20 mg by mouth 2 (two) times daily with a meal.        . warfarin (COUMADIN) 5 MG tablet Take 1 tablet (5 mg total) by mouth daily.  30 tablet  0    Discontinued Meds:   There are no discontinued medications.  Patient Active Problem List  Diagnoses  . RHEUMATIC HEART DISEASE  . Breast carcinoma  . Asthma  . Gastroesophageal reflux disease  . Hyperlipidemia  . Hypertension  . CVA (cerebral vascular accident)  . DM type 2 (diabetes mellitus, type 2)  . Hypothyroidism  . Encounter for long-term (current) use of anticoagulants     LABS Anti-coag visit on 02/20/2012  Component Date Value  . INR 02/20/2012 2.8   Anti-coag visit on 02/09/2012  Component Date Value  . INR 02/09/2012 3.1   Anti-coag visit on 02/03/2012  Component Date Value  . INR 02/03/2012 3.5   Anti-coag visit on 01/30/2012  Component Date Value  . INR 01/30/2012 2.7   Anti-coag visit on 01/26/2012  Component Date Value  . INR 01/26/2012 1.2   Admission on 01/25/2012, Discharged on 01/25/2012  Component Date Value  . Color, Urine 01/25/2012 YELLOW   . APPearance 01/25/2012 CLEAR   . Specific Gravity, Urine 01/25/2012 1.010   . pH 01/25/2012 6.0   . Glucose, UA 01/25/2012 NEGATIVE   . Hgb urine dipstick 01/25/2012 NEGATIVE   . Bilirubin Urine 01/25/2012 NEGATIVE   . Ketones, ur 01/25/2012 NEGATIVE   . Protein, ur 01/25/2012 NEGATIVE   . Urobilinogen, UA 01/25/2012 0.2   . Nitrite 01/25/2012 NEGATIVE   . Leukocytes, UA 01/25/2012 NEGATIVE   . Specimen Description 01/25/2012 URINE, CLEAN CATCH   . Special Requests  01/25/2012 NONE   . Culture  Setup Time 01/25/2012 [id number]   . Colony Count 01/25/2012 10,000 COLONIES/ML   . Culture 01/25/2012 ESCHERICHIA COLI   . Report Status 01/25/2012 01/28/2012 FINAL   . Organism ID, Bacteria 01/25/2012 ESCHERICHIA COLI   . Prothrombin Time 01/25/2012 13.9   . INR 01/25/2012 1.05   . WBC 01/25/2012 6.8   . RBC 01/25/2012 4.56   . Hemoglobin 01/25/2012 12.7   . HCT 01/25/2012 40.6   . MCV 01/25/2012 89.0   . [facility] 01/25/2012 27.9   . MCHC 01/25/2012 31.3   . RDW 01/25/2012 13.9   . Platelets 01/25/2012 161   . Neutrophils Relative 01/25/2012 73   . Neutro Abs 01/25/2012 4.9   . Lymphocytes Relative 01/25/2012 15   . Lymphs Abs 01/25/2012 1.0   . Monocytes Relative 01/25/2012 8   . Monocytes Absolute 01/25/2012 0.5   . Eosinophils Relative 01/25/2012 3   . Eosinophils Absolute 01/25/2012 0.2   . Basophils Relative 01/25/2012 1   . Basophils Absolute 01/25/2012 0.1   . Sodium  01/25/2012 134*  . Potassium 01/25/2012 4.2   . Chloride 01/25/2012 99   . CO2 01/25/2012 24   . Glucose, Bld 01/25/2012 142*  . BUN 01/25/2012 26*  . Creatinine, Ser 01/25/2012 1.19*  . Calcium 01/25/2012 9.6   . GFR calc non Af Amer 01/25/2012 46*  . GFR calc Af Amer 01/25/2012 54*  . Troponin I 01/25/2012 <0.30   Admission on 01/21/2012, Discharged on 01/24/2012  Component Date Value  . Color, Urine 01/21/2012 YELLOW   . APPearance 01/21/2012 CLEAR   . Specific Gravity, Urine 01/21/2012 1.005   . pH 01/21/2012 7.0   . Glucose, UA 01/21/2012 NEGATIVE   . Hgb urine dipstick 01/21/2012 NEGATIVE   . Bilirubin Urine 01/21/2012 NEGATIVE   . Ketones, ur 01/21/2012 NEGATIVE   . Protein, ur 01/21/2012 NEGATIVE   . Urobilinogen, UA 01/21/2012 0.2   . Nitrite 01/21/2012 NEGATIVE   . Leukocytes, UA 01/21/2012 NEGATIVE   . Sodium 01/21/2012 138   . Potassium 01/21/2012 3.9   . Chloride 01/21/2012 103   . CO2 01/21/2012 23   . Glucose, Bld 01/21/2012 116*  . BUN 01/21/2012 15   . Creatinine, Ser 01/21/2012 1.07   . Calcium 01/21/2012 9.4   . GFR calc non Af Amer 01/21/2012 52*  . GFR calc Af Amer 01/21/2012 61*  . WBC 01/21/2012 7.7   . RBC 01/21/2012 4.56   . Hemoglobin 01/21/2012 13.2   . HCT 01/21/2012 40.4   . MCV 01/21/2012 88.6   . [facility] 01/21/2012 28.9   . MCHC 01/21/2012 32.7   . RDW 01/21/2012 14.1   . Platelets 01/21/2012 185   . Neutrophils Relative 01/21/2012 71   . Neutro Abs 01/21/2012 5.5   . Lymphocytes Relative 01/21/2012 18   . Lymphs Abs 01/21/2012 1.4   . Monocytes Relative 01/21/2012 8   . Monocytes Absolute 01/21/2012 0.6   . Eosinophils Relative 01/21/2012 2   . Eosinophils Absolute 01/21/2012 0.2   . Basophils Relative 01/21/2012 1   . Basophils Absolute 01/21/2012 0.0   . Prothrombin Time 01/21/2012 14.2   . INR 01/21/2012 1.08   . aPTT 01/21/2012 30   . Cholesterol 01/21/2012 182   . Triglycerides 01/21/2012 147   . HDL 01/21/2012 52   . Total  CHOL/HDL Ratio 01/21/2012 3.5   . VLDL 01/21/2012 29   . LDL Cholesterol 01/21/2012 101*  . Hemoglobin A1C 01/21/2012 6.6*  .  Mean Plasma Glucose 01/21/2012 143*     Results for this Opt Visit:     Results for orders placed in visit on 02/20/12  POCT INR      Component Value Range   INR 2.8      EKG Orders placed during the hospital encounter of 01/25/12  . ED EKG  . ED EKG  . EKG 12-LEAD  . EKG 12-LEAD  . EKG     Prior Assessment and Plan Problem List as of 03/02/2012          Cardiology Problems   RHEUMATIC HEART DISEASE   Last Assessment & Plan Note   11/07/2011 Office Visit Signed 11/07/2011  5:07 PM by Kathlen Brunswick, MD    There may have been some progression of valvular disease, but intervention will not be warranted unless and until the patient developed symptoms clearly related to her valvular disease.  Dyspnea has been present for some time, is not progressive and is likely related to deconditioning.  She has had an amazingly good result with close mitral valvulotomy, and based upon her echocardiograms will not likely require further attention to the mitral valve.  Aortic valve disease as moderately severe, but does not require further attention at present.  I will continue to see this nice woman at annual intervals.    Hyperlipidemia   Last Assessment & Plan Note   01/30/2012 Office Visit Signed 01/30/2012  5:11 PM by Jodelle Gross, NP    She is newly started on atorvastatin 10 mg daily. She will have follow-up lipids in 3 months for ongoing assessment.    Hypertension   Last Assessment & Plan Note   01/30/2012 Office Visit Signed 01/30/2012  5:11 PM by Jodelle Gross, NP    Blood pressure is well controlled at present. She is to continue losartan 50 mg daily.     CVA (cerebral vascular accident)   Last Assessment & Plan Note   01/30/2012 Office Visit Signed 01/30/2012  5:10 PM by Jodelle Gross, NP    Newly diagnosed embolic CVA in the setting of  abnormal TEE showing LA appendage thrombus.  She has essentially recovered from this,but has generalized weakness and a flat, affect. She does not engage in conversation preferring to read a book, and only answers when her husband speaks to her. He says that she cooks and is able to walk around the house, but is still having trouble climbing stairs. She has no focal weakness.I have asked for home PT evaluation to assist in help if necessary.   She is going to follow-up with neurologist this week. She will continue on coumadin with INR checks in [place]. She will see Dr.Rothbart in one month.       Other   DM type 2 (diabetes mellitus, type 2)   Breast carcinoma   Last Assessment & Plan Note   11/07/2011 Office Visit Signed 11/07/2011  5:04 PM by Kathlen Brunswick, MD    Patient continues to have no evidence for recurrent disease, now nearly 20 years following surgery.    Asthma   Last Assessment & Plan Note   11/07/2011 Office Visit Signed 11/07/2011  5:03 PM by Kathlen Brunswick, MD    No recent symptoms to suggest reactive airways disease.    Gastroesophageal reflux disease   Hypothyroidism   Encounter for long-term (current) use of anticoagulants       Imaging: No results found.   FRS Calculation: Score not calculated. Missing:  Total Cholesterol

## 2012-03-02 NOTE — Evaluation (Signed)
Physical Therapy Evaluation  Patient Details  Name: [name] MRN: [id number] Date of Birth: 08-Jul-1946  Today's Date: 03/02/2012 Time: 0800-0843 PT Time Calculation (min): 43 min  Visit#: 8  of 8   Re-eval:   Assessment Diagnosis: CVA Next MD Visit: 03/02/2012  Authorization:    Authorization Time Period:    Authorization Visit#:   of     Past Medical History:  Past Medical History  Diagnosis Date  . Rheumatic heart disease     mild AS/AI and mild MS/trivial MR  . Gastroesophageal reflux disease     +gastoparesis  . Hyperlipidemia   . Hypertension   . Heart murmur   . Stroke   . H/O cardiac catheterization 1993  . Type 2 diabetes mellitus     "120 at fasting" - prediabetic  . Asthma     pt denies this  . Chronic kidney disease     "GFR is elevated all the time"  . Breast carcinoma 1995    1995  . Depression   . Shortness of breath     on exertion  . Diabetes mellitus    Past Surgical History:  Past Surgical History  Procedure Date  . Mitral valve surgery [facility], closed mitral valvulotomy by finger fracture  . Cardiac catheterization   . Dilation and curettage of uterus   . Mastectomy 1995    lumpectomy  . Tubal ligation 1973  . Tee without cardioversion 01/24/2012    Procedure: TRANSESOPHAGEAL ECHOCARDIOGRAM (TEE);  Surgeon: Lewayne Bunting, MD;  Location: [facility] ENDOSCOPY;  Service: Cardiovascular;  Laterality: N/A;    Subjective Symptoms/Limitations Symptoms: Ms. Cordy states that she is back to doing all her normal activities. How long can you sit comfortably?: no problem  How long can you stand comfortably?: The patient states that she has cooked and stood 35 minutes.  How long can you walk comfortably?: The patient has walked for 15 minutes due to being tired.   Pain Assessment Currently in Pain?: Yes Pain Score:   2 Pain Location: Back Pain Orientation: Left;Lower  Functional Tests Functional Tests: ABC 80%was  59%  Assessment RLE Strength Right Hip Flexion: 5/5 Right Hip Extension: 3/5 (was 3-) Right Hip ABduction: 5/5 (was 3/5) Right Hip ADduction: 5/5 (was 3/5) Right Knee Flexion: 5/5 Right Knee Extension: 5/5 Right Ankle Dorsiflexion: 5/5 LLE Strength Left Hip Flexion: 5/5 (was 4/5) Left Hip Extension: 3/5 (was 3-/5) Left Hip ABduction: 5/5 Left Hip ADduction: 5/5 (was 3/5) Left Knee Flexion: 5/5 Left Knee Extension: 5/5 Left Ankle Dorsiflexion: 5/5  Exercise/Treatments Mobility/Balance  Sharlene Motts was 51/56 now 56/56 DGE was 19/24 now 23/24     Physical Therapy Assessment and Plan PT Assessment and Plan Clinical Impression Statement: Pt has progressed well.  Only limitation is hip extension strength.  Pt given HEP for hip extension. PT Plan: D/C to HEP all short term goals met.  All but one LTG met and pt is progressing on LTG    Goals Home Exercise Program PT Goal: Perform Home Exercise Program - Progress: Met PT Short Term Goals PT Short Term Goal 1 - Progress: Met PT Short Term Goal 2 - Progress: Met PT Short Term Goal 3 - Progress: Met PT Short Term Goal 4 - Progress: Met (Has played 18 holes of golf 3 times.) PT Long Term Goals PT Long Term Goal 1: Pt hip extensors still remain only 1/2 grade stronger. PT Long Term Goal 1 - Progress: Partly met PT  Long Term Goal 2: ABC is currently at 80%  Problem List Patient Active Problem List  Diagnoses  . RHEUMATIC HEART DISEASE  . Breast carcinoma  . Asthma  . Gastroesophageal reflux disease  . Hyperlipidemia  . Hypertension  . CVA (cerebral vascular accident)  . DM type 2 (diabetes mellitus, type 2)  . Hypothyroidism  . Encounter for long-term (current) use of anticoagulants    PT - End of Session Activity Tolerance: Patient tolerated treatment well General Behavior During Session: [facility] for tasks performed Cognition: [facility] for tasks performed PT Plan of Care Consulted and Agree with Plan of Care: Patient  GP   Functional Reporting Modifier  Current Status     Goal Status  [id number] - Mobility: [place] & Moving Around [facility] - 0 percent impaired, limited or restricted  Discharge status [phone]  [facility]- I chose this due to pt stating she has always felt unsafe on chairs, ice and stepping off elevators. Pt states she is doing all activities prior to CVA Aahana Elza,CINDY 03/02/2012, 8:46 AM  Physician Documentation Your signature is required to indicate approval of the treatment plan as stated above.  Please sign and either send electronically or make a copy of this report for your files and return this physician signed original.   Please mark one 1.__approve of plan  2. ___approve of plan with the following conditions.   ______________________________                                                          _____________________ Physician Signature                                                                                                             Date

## 2012-03-02 NOTE — Assessment & Plan Note (Addendum)
No documented glucose value greater than 142; A1c was minimally elevated (6.6) in 01/2012

## 2012-03-02 NOTE — Assessment & Plan Note (Signed)
Atorvastatin was increased to 20 mg per day, apparently to further depress LDL towards 70.  In the absence of vascular disease, this is not a recommended goal of treatment.  Dose will be reduced back to 10 mg per day to determine if atorvastatin is contributing to her GI symptoms.

## 2012-03-05 ENCOUNTER — Encounter (INDEPENDENT_AMBULATORY_CARE_PROVIDER_SITE_OTHER): Payer: Medicare Other

## 2012-03-05 DIAGNOSIS — Z7901 Long term (current) use of anticoagulants: Secondary | ICD-10-CM

## 2012-03-05 NOTE — Progress Notes (Signed)
**Note De-Identified [name] Obfuscation** Addended by: Demetrios Loll on: 03/05/2012 03:51 PM   Modules accepted: Orders

## 2012-03-07 ENCOUNTER — Ambulatory Visit (INDEPENDENT_AMBULATORY_CARE_PROVIDER_SITE_OTHER): Payer: Medicare Other | Admitting: *Deleted

## 2012-03-07 DIAGNOSIS — I639 Cerebral infarction, unspecified: Secondary | ICD-10-CM

## 2012-03-07 DIAGNOSIS — Z7901 Long term (current) use of anticoagulants: Secondary | ICD-10-CM

## 2012-03-07 DIAGNOSIS — I6350 Cerebral infarction due to unspecified occlusion or stenosis of unspecified cerebral artery: Secondary | ICD-10-CM

## 2012-03-28 ENCOUNTER — Ambulatory Visit (INDEPENDENT_AMBULATORY_CARE_PROVIDER_SITE_OTHER): Payer: Medicare Other | Admitting: *Deleted

## 2012-03-28 DIAGNOSIS — I6350 Cerebral infarction due to unspecified occlusion or stenosis of unspecified cerebral artery: Secondary | ICD-10-CM

## 2012-03-28 DIAGNOSIS — I639 Cerebral infarction, unspecified: Secondary | ICD-10-CM

## 2012-03-28 DIAGNOSIS — Z7901 Long term (current) use of anticoagulants: Secondary | ICD-10-CM

## 2012-03-30 ENCOUNTER — Telehealth: Payer: Self-pay | Admitting: Cardiology

## 2012-03-30 NOTE — Telephone Encounter (Signed)
Patient is concerned about possible bleeding under the skin on one of her ankles.

## 2012-03-30 NOTE — Telephone Encounter (Signed)
Pt describes petechiae looking places around ankles above sock level.  Inform pt this may or may not be related to coumadin.  INR was 3.3 on 6/26 and coumadin was decreased.  Told pt to take 1/2 tablet tonight then resume same dose.  Told pt to call me on Monday if symptoms are not better and I will recheck INR.  Pt in agreement.

## 2012-04-18 ENCOUNTER — Ambulatory Visit (INDEPENDENT_AMBULATORY_CARE_PROVIDER_SITE_OTHER): Payer: Medicare Other | Admitting: *Deleted

## 2012-04-18 DIAGNOSIS — Z7901 Long term (current) use of anticoagulants: Secondary | ICD-10-CM

## 2012-04-18 DIAGNOSIS — I639 Cerebral infarction, unspecified: Secondary | ICD-10-CM

## 2012-04-18 DIAGNOSIS — I6350 Cerebral infarction due to unspecified occlusion or stenosis of unspecified cerebral artery: Secondary | ICD-10-CM

## 2012-05-16 ENCOUNTER — Ambulatory Visit (INDEPENDENT_AMBULATORY_CARE_PROVIDER_SITE_OTHER): Payer: Medicare Other | Admitting: *Deleted

## 2012-05-16 DIAGNOSIS — Z7901 Long term (current) use of anticoagulants: Secondary | ICD-10-CM

## 2012-05-16 DIAGNOSIS — I6350 Cerebral infarction due to unspecified occlusion or stenosis of unspecified cerebral artery: Secondary | ICD-10-CM

## 2012-05-16 DIAGNOSIS — I639 Cerebral infarction, unspecified: Secondary | ICD-10-CM

## 2012-05-16 LAB — POCT INR: INR: 2.1

## 2012-06-13 ENCOUNTER — Ambulatory Visit (INDEPENDENT_AMBULATORY_CARE_PROVIDER_SITE_OTHER): Payer: Medicare Other | Admitting: Physician Assistant

## 2012-06-13 ENCOUNTER — Ambulatory Visit (INDEPENDENT_AMBULATORY_CARE_PROVIDER_SITE_OTHER): Payer: Medicare Other | Admitting: *Deleted

## 2012-06-13 ENCOUNTER — Encounter: Payer: Self-pay | Admitting: Physician Assistant

## 2012-06-13 VITALS — BP 116/60 | HR 75 | Ht 62.0 in | Wt 175.2 lb

## 2012-06-13 DIAGNOSIS — I639 Cerebral infarction, unspecified: Secondary | ICD-10-CM

## 2012-06-13 DIAGNOSIS — Z7901 Long term (current) use of anticoagulants: Secondary | ICD-10-CM

## 2012-06-13 DIAGNOSIS — R0600 Dyspnea, unspecified: Secondary | ICD-10-CM

## 2012-06-13 DIAGNOSIS — I099 Rheumatic heart disease, unspecified: Secondary | ICD-10-CM | POA: Insufficient documentation

## 2012-06-13 DIAGNOSIS — R609 Edema, unspecified: Secondary | ICD-10-CM | POA: Insufficient documentation

## 2012-06-13 DIAGNOSIS — I6350 Cerebral infarction due to unspecified occlusion or stenosis of unspecified cerebral artery: Secondary | ICD-10-CM

## 2012-06-13 DIAGNOSIS — I6340 Cerebral infarction due to embolism of unspecified cerebral artery: Secondary | ICD-10-CM

## 2012-06-13 DIAGNOSIS — R0989 Other specified symptoms and signs involving the circulatory and respiratory systems: Secondary | ICD-10-CM

## 2012-06-13 DIAGNOSIS — R0609 Other forms of dyspnea: Secondary | ICD-10-CM | POA: Insufficient documentation

## 2012-06-13 LAB — POCT INR: INR: 2.5

## 2012-06-13 MED ORDER — HYDROCHLOROTHIAZIDE 25 MG PO TABS: 12.5000 mg | ORAL_TABLET | ORAL | Status: DC | PRN

## 2012-06-13 NOTE — Progress Notes (Signed)
HPI:  This is a 66 year old female patient who had a right frontal CVA likely secondary to a cardiogenic embolus. A possible left atrial appendage thrombus was identified on TEE and she has been maintained on warfarin. She has history of mitral stenosis and moderate aortic stenosis. She comes in today and her main complaint is dyspnea on exertion. She has been working with the physical therapist and can only walk 6 minutes on a treadmill before she gets completely out of breath. This has been an ongoing problem and has not gotten significantly worse. Her husband have been against any valve repair in the past but they have decided that they would proceed with valvular surgery if it was deemed necessary. She has also had ankle and leg swelling bilaterally but occurs every day which is new for her. Her last 2-D echo was in April at which time she had mild to moderate MS 1.85cm^2. Mean gradient: 8mm Hg (D). Peak gradient: 14mm Hg (D).and moderate AF  No Known Allergies  Current Outpatient Prescriptions on File Prior to Visit: atorvastatin (LIPITOR) 10 MG tablet, Take 1 tablet (10 mg total) by mouth daily., Disp: 90 tablet, Rfl: 3 buPROPion (WELLBUTRIN XL) 150 MG 24 hr tablet, Take 150 mg by mouth daily.  , Disp: , Rfl:  cetirizine (ZYRTEC) 10 MG tablet, Take 10 mg by mouth daily., Disp: , Rfl:  Cholecalciferol (VITAMIN D PO), Take 1 capsule by mouth daily., Disp: , Rfl:  levothyroxine (SYNTHROID, LEVOTHROID) 88 MCG tablet, Take 88 mcg by mouth daily. 1 tablet daily, except 2 tablets on Sunday, Disp: , Rfl:  losartan (COZAAR) 100 MG tablet, Take 50 mg by mouth daily.  , Disp: , Rfl:   Multiple Vitamin (MULITIVITAMIN WITH MINERALS) TABS, Take 1 tablet by mouth daily., Disp: , Rfl:  omeprazole (PRILOSEC) 20 MG capsule, Take 20 mg by mouth 2 (two) times daily with a meal.  , Disp: , Rfl:  warfarin (COUMADIN) 5 MG tablet, Take 1 tablet (5 mg total) by mouth daily., Disp: 30 tablet, Rfl: 0 hydrochlorothiazide  (HYDRODIURIL) 25 MG tablet, Take 0.5 tablets (12.5 mg total) by mouth as needed (swelling/edema)., Disp: 30 tablet, Rfl: 3    Past Medical History:   Rheumatic heart disease                                        Comment:mild AS/AI and mild MS/trivial MR; cardiac cath              in 1993; chronic class II dyspnea on exertion   Gastroesophageal reflux disease                                Comment:+gastoparesis   Hyperlipidemia                                               Hypertension                                                 Cardioembolic stroke  01/2012         Comment:Right frontal in 01/2012; normal carotid               ultrasound; possible LAA thrombus by TEE;               virtual complete neurologic recovery   Fasting hyperglycemia                                          Comment:120 fasting   Chronic kidney disease, stage 2, mildly decrea*                Comment:GFR of approximately 60   Breast carcinoma                                1995           Comment:1995   Depression                                                  Past Surgical History:   MITRAL VALVE SURGERY                            1962           Comment:[facility], closed mitral valvulotomy by finger               fracture   CARDIAC CATHETERIZATION                                      DILATION AND CURETTAGE OF UTERUS                             MASTECTOMY                                      1995           Comment:lumpectomy   TUBAL LIGATION                                  1973         TEE WITHOUT CARDIOVERSION                       01/24/2012      Comment:Procedure: TRANSESOPHAGEAL ECHOCARDIOGRAM               (TEE);  Surgeon: Lewayne Bunting, MD;                Location: [facility];  Service:               Cardiovascular;  Laterality: N/A;  Review of patient's family history indicates:   Heart disease                  Brother  Comment: MI   Rheum  arthritis                Maternal Grandmother     Asthma                         Maternal Grandfather     Heart disease                  Mother                   Social History   Marital Status: Married             Spouse Name:                      Years of Education:                 Number of children: 1           Occupational History Occupation          Associate Professor            Comment              Retired Runner, broadcasting/film/video                            Social History Main Topics   Smoking Status: Never Smoker                     Smokeless Status: Never Used                       Alcohol Use: No             Drug Use: No             Sexual Activity: Not on file        Other Topics            Concern   None on file  Social History Narrative   Married with children   No regular exercise    ROS:she's had some memory problems since before her stroke. Otherwise see history of present illness   PHYSICAL EXAM: Well-nournished, in no acute distress. Neck: No JVD, HJR, Bruit, or thyroid enlargement  Lungs: No tachypnea, clear without wheezing, rales, or rhonchi  Cardiovascular: RRR, PMI not displaced,2/6 harsh systolic murmur at the left sternal border and 1/6 diastolic murmur at the left stromal border, 2/6 systolic murmur at the apex, no opening snap appreciated  Abdomen: BS normal. Soft without organomegaly, masses, lesions or tenderness.  Extremities: +1 edema bilaterally without cyanosis, clubbing . Good distal pulses bilateral  SKin: Warm, no lesions or rashes   Musculoskeletal: No deformities  Neuro: no focal signs  BP 116/60  Pulse 75  Ht 5\' 2"  (1.575 m)  Wt 175 lb 4 oz (79.493 kg)  BMI 32.05 kg/m2   :  [facility]:01/24/12 Study Conclusions  - Left ventricle: Systolic function was normal. The estimated ejection fraction was in the range of 55% to 60%. Wall motion was normal; there were no regional wall motion abnormalities. - Aortic valve: Valve mobility was restricted.  Mild regurgitation. - Mitral valve: Moderate thickening, consistent with rheumatic disease. The findings are consistent with mild to moderate stenosis. Mild regurgitation. Valve area by pressure half-time: 1.85cm^2. - Left atrium: The atrium was moderately dilated. There was a possible thrombus  in the appendage. There was mild spontaneous echo contrast ("smoke") in the cavity. Impressions:  - LAA not well visualized; cannot exclude thrombus completely occluding the appendage. Transesophageal echocardiography. 2D and color Doppler. Patient status: Inpatient. Location: Endoscopy.  ------------------------------------------------------------  ------------------------------------------------------------ Left ventricle: Systolic function was normal. The estimated ejection fraction was in the range of 55% to 60%. Wall motion was normal; there were no regional wall motion abnormalities.  ------------------------------------------------------------ Aortic valve: Trileaflet; mildly thickened leaflets. Valve mobility was restricted. Doppler: Mild regurgitation.  ------------------------------------------------------------ Aorta: Descending aorta: The descending aorta had mild diffuse atheroscerotic disease.  ------------------------------------------------------------ Mitral valve: Moderate thickening, consistent with rheumatic disease. Doppler: The findings are consistent with mild to moderate stenosis. Mild regurgitation. Valve area by pressure half-time: 1.85cm^2. Mean gradient: 8mm Hg (D). Peak gradient: 14mm Hg (D).  ------------------------------------------------------------ Left atrium: The atrium was moderately dilated. There was a possible thrombus in the appendage. There was mild spontaneous echo contrast ("smoke") in the cavity.  ------------------------------------------------------------ Right ventricle: The cavity size was normal. Systolic function was  normal.  ------------------------------------------------------------ Pulmonic valve: Doppler: No significant regurgitation.  ------------------------------------------------------------ Tricuspid valve: Structurally normal valve. Leaflet separation was normal. Doppler: Mild regurgitation.  ------------------------------------------------------------ Right atrium: The atrium was normal in size.  ------------------------------------------------------------  Doppler measurements Normal Mitral valve Mean vel, D 136 cm/s ------ Pressure 119 ms ------ half-time Mean 8 mm ------ gradient, D Hg Peak 14 mm ------ gradient, D Hg Area (PHT) 1.85 cm^2 ------ Annulus VTI 59.8 cm ------  ------------------------------------------------------------ Prepared and Electronically Authenticated by  [facility] 01/22/12: Study Conclusions  - Left ventricle: The cavity size was normal. Wall thickness   was normal. The estimated ejection fraction was 60%. Wall   motion was normal; there were no regional wall motion   abnormalities. - Aortic valve: Thickened leaflets. Moderate AS.   Mild/moderate AI - Mitral valve: Rheumatic MS. The MS is moderate. - Left atrium: The atrium was moderately dilated. - Impressions: I have carefully compared this study to the   study of 10/2011. There is NSR. There is rheumatic MS,   which increases the chance of embolic potential. There is   no obvious shunt. The measurements are complicated. I have   summarized the grades of the valvular abnormalities. They   are similar to the study of 10/2011. No cardiac source of   embolism was identified, but cannot be ruled out on the   basis of this examination. Impressions:  - I have carefully compared this study to the study of   10/2011. There is NSR. There is rheumatic MS, which   increases the chance of embolic potential. There is no   obvious shunt. The measurements are complicated. I have   summarized the grades of  the valvular abnormalities. They   are similar to the study of 10/2011. No cardiac source of   embolism was identified, but cannot be ruled out on the   basis of this examination. Transthoracic echocardiography.  M-mode, complete 2D, spectral Doppler, and color Doppler.  Height:  Height: 157.5cm. Height: 62in.  Weight:  Weight: 78.2kg. Weight: 172lb.  Body mass index:  BMI: 31.5kg/m^2.  Body surface area:    BSA: 1.50m^2.  Blood pressure:     150/65.  Patient status:  Observation.  Location:  Echo laboratory.  ------------------------------------------------------------  ------------------------------------------------------------ Left ventricle:  The cavity size was normal. Wall thickness was normal. The estimated ejection fraction was 60%. Wall motion was normal; there were no regional wall motion abnormalities.  ------------------------------------------------------------ Aortic valve:  Thickened leaflets.  Moderate AS. Mild/moderate AI  Doppler:     VTI ratio of LVOT to aortic valve: 0.23. Valve area: 0.71cm^2(VTI). Indexed valve area: 0.38cm^2/m^2 (VTI). Peak velocity ratio of LVOT to aortic valve: 0.22. Valve area: 0.69cm^2 (Vmax). Indexed valve area: 0.37cm^2/m^2 (Vmax).    Mean gradient: 20mm Hg (S). Peak gradient: 35mm Hg (S).  ------------------------------------------------------------ Aorta:  Aortic root: The aortic root was normal in size.  ------------------------------------------------------------ Mitral valve:  Rheumatic MS. The MS is moderate.  Doppler:   Valve area by continuity equation (using LVOT flow): 0.81cm^2. Indexed valve area by continuity equation (using LVOT flow): 0.43cm^2/m^2.    Mean gradient: 10mm Hg (D). Peak gradient: 26mm Hg (D).  ------------------------------------------------------------ Left atrium:  The atrium was moderately dilated.  ------------------------------------------------------------ Right ventricle:  The cavity size was  normal. Systolic function was normal.  ------------------------------------------------------------ Pulmonic valve:    The valve appears to be grossly normal.  Doppler:   No significant regurgitation.  ------------------------------------------------------------ Tricuspid valve:   Structurally normal valve.   Leaflet separation was normal.  Doppler:  Transvalvular velocity was within the normal range.  No regurgitation.  ------------------------------------------------------------ Right atrium:  The atrium was normal in size.  ------------------------------------------------------------ Pericardium:  There was no pericardial effusion.  ------------------------------------------------------------  2D measurements        Normal  Doppler measurements   Normal Left ventricle                 Left ventricle LVID ED,     40 mm     43-52   Ea, lat    4.68 cm/s   ------ chord,                         ann, tiss PLAX                           DP LVID ES,     27 mm     23-38   E/Ea, lat  46.7        ------ chord,                         ann, tiss     9 PLAX                           DP FS, chord,   33 %      >29     Ea, med    6.04 cm/s   ------ PLAX                           ann, tiss LVPW, ED     11 mm     ------  DP IVS/LVPW   0.91        <1.3    E/Ea, med  36.2        ------ ratio, ED                      ann, tiss     6 Ventricular septum             DP IVS, ED      10 mm     ------  LVOT LVOT  Peak vel,  65.3 cm/s   ------ Diam, S      20 mm     ------  S Area       3.14 cm^2   ------  VTI, S     17.1 cm     ------ Aorta                          Aortic valve Root diam,   24 mm     ------  Peak vel,   296 cm/s   ------ ED                             S Left atrium                    Mean vel,   211 cm/s   ------ AP dim       45 mm     ------  S AP dim     2.39 cm/m^2 <2.2    VTI, S     75.7 cm     ------ index                          Mean         20 mm Hg   ------ Right ventricle                gradient, RVID ED,     33 mm     19-38   S PLAX                           Peak         35 mm Hg  ------                                gradient,                                S                                VTI ratio  0.23        ------                                LVOT/AV                                Area, VTI  0.71 cm^2   ------                                Area index 0.38 cm^2/m ------                                (VTI)           Karie Fetch  Peak vel   0.22        ------                                ratio,                                LVOT/AV                                Area, Vmax 0.69 cm^2   ------                                Area index 0.37 cm^2/m ------                                (Vmax)          ^2                                Regurg      325 cm/s   ------                                vel, ED                                Regurg PHT  392 ms     ------                                Regurg       42 mm Hg  ------                                gradient,                                ED                                Mitral valve                                Peak E vel  219 cm/s   ------                                Peak A vel  141 cm/s   ------                                Mean vel,   149 cm/s   ------  D                                Decelerati  440 ms     150-23                                on time                0                                Mean         10 mm Hg  ------                                gradient,                                D                                Peak         26 mm Hg  ------                                gradient,                                D                                Peak E/A    1.6        ------                                ratio                                Area       0.81 cm^2   ------                                 (LVOT)                                continuity                                Area index 0.43 cm^2/m ------                                (LVOT           Karie Fetch  cont)                                Annulus    66.7 cm     ------                                VTI

## 2012-06-13 NOTE — Assessment & Plan Note (Signed)
Recovering nicely and currently undergoing physical therapy. On chronic anticoagulation. Thrombus may have formed due to the presence of mitral stenosis.

## 2012-06-13 NOTE — Assessment & Plan Note (Signed)
Patient complains of dyspnea on exertion and exercise intolerance which has not worsened since her last office visit. She does have significant valvular heart disease with moderate left ear and mild to moderate MS. She is now receptive to valvular surgery if indicated. Her last echo was in April of this year. Because of her dyspnea on exertion and new leg edema we'll recheck a 2-D echo in the next 2 months and have her followup with Dr. Dietrich Pates.

## 2012-06-13 NOTE — Patient Instructions (Signed)
HCTZ 12.5mg  (1/2 tab) as needed for swelling.  Your physician has requested that you have an echocardiogram. Echocardiography is a painless test that uses sound waves to create images of your heart. It provides your doctor with information about the size and shape of your heart and how well your heart's chambers and valves are working. This procedure takes approximately one hour. There are no restrictions for this procedure.  Your physician recommends that you schedule a follow-up appointment with Dr. Dietrich Pates after echo.

## 2012-06-13 NOTE — Assessment & Plan Note (Signed)
Patient has history of closed mitral valvulotomy. She now has chronic dyspnea on exertion and leg edema. She does have moderate left ear and mild to moderate MS. We'll recheck a 2-D echo in one to 2 months and have her followup with Dr. Dietrich Pates.

## 2012-06-13 NOTE — Assessment & Plan Note (Signed)
I have given her hydrochlorothiazide 25 mg one half tablet as needed for leg edema

## 2012-06-29 ENCOUNTER — Ambulatory Visit: Payer: Medicare Other | Admitting: Cardiology

## 2012-07-11 ENCOUNTER — Ambulatory Visit (INDEPENDENT_AMBULATORY_CARE_PROVIDER_SITE_OTHER): Payer: Medicare Other | Admitting: *Deleted

## 2012-07-11 ENCOUNTER — Ambulatory Visit (HOSPITAL_COMMUNITY)
Admission: RE | Admit: 2012-07-11 | Discharge: 2012-07-11 | Disposition: A | Discharge status: Home / Self Care | Payer: Medicare Other | Source: Ambulatory Visit | Attending: Physician Assistant | Admitting: Physician Assistant

## 2012-07-11 DIAGNOSIS — I359 Nonrheumatic aortic valve disorder, unspecified: Secondary | ICD-10-CM

## 2012-07-11 DIAGNOSIS — Z7901 Long term (current) use of anticoagulants: Secondary | ICD-10-CM

## 2012-07-11 DIAGNOSIS — I10 Essential (primary) hypertension: Secondary | ICD-10-CM | POA: Insufficient documentation

## 2012-07-11 DIAGNOSIS — C50919 Malignant neoplasm of unspecified site of unspecified female breast: Secondary | ICD-10-CM | POA: Insufficient documentation

## 2012-07-11 DIAGNOSIS — R0600 Dyspnea, unspecified: Secondary | ICD-10-CM

## 2012-07-11 DIAGNOSIS — R609 Edema, unspecified: Secondary | ICD-10-CM

## 2012-07-11 DIAGNOSIS — R0609 Other forms of dyspnea: Secondary | ICD-10-CM | POA: Insufficient documentation

## 2012-07-11 DIAGNOSIS — I6350 Cerebral infarction due to unspecified occlusion or stenosis of unspecified cerebral artery: Secondary | ICD-10-CM

## 2012-07-11 DIAGNOSIS — I639 Cerebral infarction, unspecified: Secondary | ICD-10-CM

## 2012-07-11 DIAGNOSIS — R0989 Other specified symptoms and signs involving the circulatory and respiratory systems: Secondary | ICD-10-CM | POA: Insufficient documentation

## 2012-07-11 DIAGNOSIS — I050 Rheumatic mitral stenosis: Secondary | ICD-10-CM | POA: Insufficient documentation

## 2012-07-11 LAB — POCT INR: INR: 2.2

## 2012-07-11 NOTE — Progress Notes (Signed)
*  PRELIMINARY RESULTS* Echocardiogram 2D Echocardiogram has been performed.  [name] 07/11/2012, 10:19 AM

## 2012-07-25 ENCOUNTER — Ambulatory Visit: Payer: Medicare Other | Admitting: Cardiology

## 2012-07-27 ENCOUNTER — Encounter: Payer: Self-pay | Admitting: Cardiology

## 2012-07-27 ENCOUNTER — Ambulatory Visit (INDEPENDENT_AMBULATORY_CARE_PROVIDER_SITE_OTHER): Payer: Medicare Other | Admitting: Cardiology

## 2012-07-27 VITALS — BP 120/70 | HR 64 | Ht 62.0 in | Wt 173.0 lb

## 2012-07-27 DIAGNOSIS — I099 Rheumatic heart disease, unspecified: Secondary | ICD-10-CM

## 2012-07-27 DIAGNOSIS — Z7901 Long term (current) use of anticoagulants: Secondary | ICD-10-CM

## 2012-07-27 DIAGNOSIS — E039 Hypothyroidism, unspecified: Secondary | ICD-10-CM

## 2012-07-27 NOTE — Patient Instructions (Addendum)
Your physician recommends that you schedule a follow-up appointment in: 6 months  Your physician recommends that you return for lab work in: Within the week  Stools x 3

## 2012-07-27 NOTE — Progress Notes (Deleted)
Name: [name]    DOB: 04/10/1946  Age: 66 y.o.  MR#: [id number]       PCP:  Carylon Perches, MD      Insurance: @PAYORNAME @   CC:   No chief complaint on file.  MED LIST REVIEWED  VS BP 120/70  Pulse 64  Ht 5\' 2"  (1.575 m)  Wt 173 lb (78.472 kg)  BMI 31.64 kg/m2  Weights Current Weight  07/27/12 173 lb (78.472 kg)  06/13/12 175 lb 4 oz (79.493 kg)  03/02/12 172 lb (78.019 kg)    Blood Pressure  BP Readings from Last 3 Encounters:  07/27/12 120/70  06/13/12 116/60  03/02/12 122/70     Admit date:  (Not on file) Last encounter with RMR:  03/30/2012   Allergy No Known Allergies  Current Outpatient Prescriptions  Medication Sig Dispense Refill  . atorvastatin (LIPITOR) 10 MG tablet Take 1 tablet (10 mg total) by mouth daily.  90 tablet  3  . buPROPion (WELLBUTRIN XL) 150 MG 24 hr tablet Take 150 mg by mouth daily.        . cetirizine (ZYRTEC) 10 MG tablet Take 10 mg by mouth daily.      . Cholecalciferol (VITAMIN D PO) Take 1 capsule by mouth daily.      . hydrochlorothiazide (HYDRODIURIL) 25 MG tablet Take 0.5 tablets (12.5 mg total) by mouth as needed (swelling/edema).  30 tablet  3  . levothyroxine (SYNTHROID, LEVOTHROID) 88 MCG tablet Take 88 mcg by mouth daily. 1 tablet daily, except 2 tablets on Sunday      . losartan (COZAAR) 100 MG tablet Take 50 mg by mouth daily.        . Multiple Vitamin (MULITIVITAMIN WITH MINERALS) TABS Take 1 tablet by mouth daily.      Marland Kitchen omeprazole (PRILOSEC) 20 MG capsule Take 20 mg by mouth 2 (two) times daily with a meal.        . warfarin (COUMADIN) 5 MG tablet Take 1 tablet (5 mg total) by mouth daily.  30 tablet  0    Discontinued Meds:   There are no discontinued medications.  Patient Active Problem List  Diagnosis  . Breast carcinoma  . Gastroesophageal reflux disease  . Hyperlipidemia  . Hypertension  . Hypothyroidism  . Chronic anticoagulation  . Cardioembolic stroke  . Fasting hyperglycemia  . Dyspnea on exertion  .  Edema  . Rheumatic heart disease    LABS Anti-coag visit on 07/11/2012  Component Date Value  . INR 07/11/2012 2.2   Anti-coag visit on 06/13/2012  Component Date Value  . INR 06/13/2012 2.5   Anti-coag visit on 05/16/2012  Component Date Value  . INR 05/16/2012 2.1      Results for this Opt Visit:     Results for orders placed in visit on 07/11/12  POCT INR      Component Value Range   INR 2.2      EKG Orders placed during the hospital encounter of 01/25/12  . ED EKG  . ED EKG  . EKG 12-LEAD  . EKG 12-LEAD  . EKG     Prior Assessment and Plan Problem List as of 07/27/2012            Cardiology Problems   Hyperlipidemia   Last Assessment & Plan Note   03/02/2012 Office Visit Signed 03/02/2012 12:20 PM by Kathlen Brunswick, MD    Atorvastatin was increased to 20 mg per day, apparently to further depress LDL  towards 70.  In the absence of vascular disease, this is not a recommended goal of treatment.  Dose will be reduced back to 10 mg per day to determine if atorvastatin is contributing to her GI symptoms.    Hypertension   Last Assessment & Plan Note   03/02/2012 Office Visit Signed 03/02/2012 12:22 PM by Kathlen Brunswick, MD    Blood pressure control has been generally good.  If additional therapy is needed, low dose diuretic would be appropriate, as this would likely also improve the patient's mild pedal edema.    Cardioembolic stroke   Last Assessment & Plan Note   06/13/2012 Office Visit Signed 06/13/2012  2:40 PM by Dyann Kief, PA    Recovering nicely and currently undergoing physical therapy. On chronic anticoagulation. Thrombus may have formed due to the presence of mitral stenosis.    Rheumatic heart disease   Last Assessment & Plan Note   06/13/2012 Office Visit Signed 06/13/2012  2:44 PM by Dyann Kief, PA    Patient has history of closed mitral valvulotomy. She now has chronic dyspnea on exertion and leg edema. She does have moderate left ear and  mild to moderate MS. We'll recheck a 2-D echo in one to 2 months and have her followup with Dr. Dietrich Pates.       Other   Breast carcinoma   Last Assessment & Plan Note   11/07/2011 Office Visit Signed 11/07/2011  5:04 PM by Kathlen Brunswick, MD    Patient continues to have no evidence for recurrent disease, now nearly 20 years following surgery.    Gastroesophageal reflux disease   Hypothyroidism   Chronic anticoagulation   Last Assessment & Plan Note   03/02/2012 Office Visit Addendum 03/05/2012  7:42 AM by Kathlen Brunswick, MD    She has done well with initial warfarin therapy without any abnormal bleeding.  She has developed excessive gas that she attributes to warfarin.  We will try to adjust her dose of atorvastatin to determine if that is contributing to her GI problems.  If not, a trial of an alternate oral anticoagulant may be necessary.  Monitoring of CBC and stool Hemoccults will be maintained to exclude occult GI blood loss.    Fasting hyperglycemia   Last Assessment & Plan Note   03/02/2012 Office Visit Addendum 03/02/2012 12:20 PM by Kathlen Brunswick, MD    No documented glucose value greater than 142; A1c was minimally elevated (6.6) in 01/2012    Dyspnea on exertion   Last Assessment & Plan Note   06/13/2012 Office Visit Signed 06/13/2012  2:39 PM by Dyann Kief, PA    Patient complains of dyspnea on exertion and exercise intolerance which has not worsened since her last office visit. She does have significant valvular heart disease with moderate left ear and mild to moderate MS. She is now receptive to valvular surgery if indicated. Her last echo was in April of this year. Because of her dyspnea on exertion and new leg edema we'll recheck a 2-D echo in the next 2 months and have her followup with Dr. Dietrich Pates.    Edema   Last Assessment & Plan Note   06/13/2012 Office Visit Signed 06/13/2012  2:39 PM by Dyann Kief, PA    I have given her hydrochlorothiazide 25 mg one half  tablet as needed for leg edema        Imaging: No results found.   FRS Calculation: Score not  calculated. Missing: Total Cholesterol     \

## 2012-07-28 LAB — CBC
MCH: 29.2 pg (ref 26.0–34.0)
Platelets: 217 10*3/uL (ref 150–400)
RBC: 4.59 MIL/uL (ref 3.87–5.11)
WBC: 5.9 10*3/uL (ref 4.0–10.5)

## 2012-07-28 LAB — COMPREHENSIVE METABOLIC PANEL
ALT: 18 U/L (ref 0–35)
CO2: 27 mEq/L (ref 19–32)
Sodium: 144 mEq/L (ref 135–145)
Total Bilirubin: 0.5 mg/dL (ref 0.3–1.2)
Total Protein: 7.3 g/dL (ref 6.0–8.3)

## 2012-07-28 LAB — TSH: TSH: 3.235 u[IU]/mL (ref 0.350–4.500)

## 2012-08-03 ENCOUNTER — Other Ambulatory Visit (INDEPENDENT_AMBULATORY_CARE_PROVIDER_SITE_OTHER): Payer: Medicare Other | Admitting: *Deleted

## 2012-08-03 ENCOUNTER — Encounter: Payer: Self-pay | Admitting: Cardiology

## 2012-08-03 DIAGNOSIS — Z7901 Long term (current) use of anticoagulants: Secondary | ICD-10-CM

## 2012-08-03 LAB — POC HEMOCCULT BLD/STL (HOME/3-CARD/SCREEN): Card #2 Fecal Occult Blod, POC: NEGATIVE

## 2012-08-03 NOTE — Progress Notes (Signed)
Patient ID: [name], female   DOB: 1946-09-16, 66 y.o.   MRN: [id number]  HPI: Scheduled return visit for this nice woman with a recent right frontal CVA and possible left atrial appendage thrombus.  She continues to do well with no new symptoms or medical problems.  She is tolerating anticoagulation without any significant issues.  Prior to Admission medications   Medication Sig Start Date End Date Taking? Authorizing Provider  atorvastatin (LIPITOR) 10 MG tablet Take 1 tablet (10 mg total) by mouth daily. 03/02/12  Yes Kathlen Brunswick, MD  buPROPion (WELLBUTRIN XL) 150 MG 24 hr tablet Take 150 mg by mouth daily.     Yes Historical Provider, MD  cetirizine (ZYRTEC) 10 MG tablet Take 10 mg by mouth daily.   Yes Historical Provider, MD  Cholecalciferol (VITAMIN D PO) Take 1 capsule by mouth daily.   Yes Historical Provider, MD  hydrochlorothiazide (HYDRODIURIL) 25 MG tablet Take 0.5 tablets (12.5 mg total) by mouth as needed (swelling/edema). 06/13/12 06/13/13 Yes Dyann Kief, PA  levothyroxine (SYNTHROID, LEVOTHROID) 88 MCG tablet Take 88 mcg by mouth daily. 1 tablet daily, except 2 tablets on Sunday   Yes Historical Provider, MD  losartan (COZAAR) 100 MG tablet Take 50 mg by mouth daily.     Yes Historical Provider, MD  Multiple Vitamin (MULITIVITAMIN WITH MINERALS) TABS Take 1 tablet by mouth daily.   Yes Historical Provider, MD  omeprazole (PRILOSEC) 20 MG capsule Take 20 mg by mouth 2 (two) times daily with a meal.     Yes Historical Provider, MD  warfarin (COUMADIN) 5 MG tablet Take 1 tablet (5 mg total) by mouth daily. 01/24/12 01/23/13 Yes Sorin Luanne Bras, MD   No Known Allergies    Past medical history, social history, and family history reviewed and updated.  ROS: Denies orthopnea or PND.  All other systems reviewed and are negative.  PHYSICAL EXAM: BP 120/70  Pulse 64  Ht 5\' 2"  (1.575 m)  Wt 78.472 kg (173 lb)  BMI 31.64 kg/m2  General-Well developed; no acute  distress Body habitus-mildly overweight Neck-No JVD; harsh bilateral carotid bruits Lungs-clear lung fields; resonant to percussion; left thoracotomy scar Cardiovascular-normal PMI; normal S1 and S2; grade 2/6 systolic ejection murmur at the cardiac base Abdomen-normal bowel sounds; soft and non-tender without masses or organomegaly Musculoskeletal-No deformities, no cyanosis or clubbing Neurologic-Normal cranial nerves; symmetric strength and tone Skin-Warm, no significant lesions Extremities-distal pulses intact; no edema  ASSESSMENT AND PLAN:  [place] Bing, MD 08/03/2012 12:01 PM

## 2012-08-03 NOTE — Assessment & Plan Note (Addendum)
Patient has had excellent results with a very early surgical procedure for treatment of rheumatic mitral stenosis.  She currently has no indication for intervention for MS/MR and AS/AI

## 2012-08-22 ENCOUNTER — Ambulatory Visit (INDEPENDENT_AMBULATORY_CARE_PROVIDER_SITE_OTHER): Payer: Medicare Other | Admitting: *Deleted

## 2012-08-22 DIAGNOSIS — Z7901 Long term (current) use of anticoagulants: Secondary | ICD-10-CM

## 2012-08-22 DIAGNOSIS — I6350 Cerebral infarction due to unspecified occlusion or stenosis of unspecified cerebral artery: Secondary | ICD-10-CM

## 2012-08-22 DIAGNOSIS — I639 Cerebral infarction, unspecified: Secondary | ICD-10-CM

## 2012-08-22 LAB — POCT INR: INR: 2.2

## 2012-09-17 ENCOUNTER — Encounter: Payer: Self-pay | Admitting: Pulmonary Disease

## 2012-09-17 ENCOUNTER — Ambulatory Visit (INDEPENDENT_AMBULATORY_CARE_PROVIDER_SITE_OTHER)
Admission: RE | Admit: 2012-09-17 | Discharge: 2012-09-17 | Disposition: A | Discharge status: Home / Self Care | Payer: Medicare Other | Source: Ambulatory Visit | Attending: Pulmonary Disease | Admitting: Pulmonary Disease

## 2012-09-17 ENCOUNTER — Ambulatory Visit (INDEPENDENT_AMBULATORY_CARE_PROVIDER_SITE_OTHER): Payer: Medicare Other | Admitting: Pulmonary Disease

## 2012-09-17 VITALS — BP 100/64 | HR 67 | Temp 98.6°F | Ht 62.5 in | Wt 164.6 lb

## 2012-09-17 DIAGNOSIS — R05 Cough: Secondary | ICD-10-CM

## 2012-09-17 DIAGNOSIS — R058 Other specified cough: Secondary | ICD-10-CM | POA: Insufficient documentation

## 2012-09-17 DIAGNOSIS — R059 Cough, unspecified: Secondary | ICD-10-CM

## 2012-09-17 MED ORDER — BENZONATATE 100 MG PO CAPS: 200.0000 mg | ORAL_CAPSULE | Freq: Four times a day (QID) | ORAL | Status: DC | PRN

## 2012-09-17 MED ORDER — OMEPRAZOLE 40 MG PO CPDR: 40.0000 mg | DELAYED_RELEASE_CAPSULE | Freq: Two times a day (BID) | ORAL | Status: DC

## 2012-09-17 NOTE — Patient Instructions (Addendum)
Will check a cxr today and call you with results. Get chlorpheniramine 4mg  tabs OTC and take 2 each night at bedtime, and one at lunch for next 2 weeks. Increase your omeprazole to 40mg  one in am AND pm for the next 2 weeks. Will try tessalon pearls for cough suppression during day, and take 2 every 6 hrs if needed. Limit voice use as much as possible (no talking for more than a few minutes, no singing or yelling) Use hard candy (no mint or cough drops/menthol) to bathe back of throat from sunup to bedtime for next 2 weeks. Please call me in 2 weeks with progress.

## 2012-09-17 NOTE — Addendum Note (Signed)
Addended by: Nita Sells on: 09/17/2012 03:00 PM   Modules accepted: Orders

## 2012-09-17 NOTE — Addendum Note (Signed)
Addended by: Nita Sells on: 09/17/2012 03:02 PM   Modules accepted: Orders

## 2012-09-17 NOTE — Progress Notes (Signed)
  Subjective:    Patient ID: [name], female    DOB: [birth year], 66 y.o.   MRN: [id number]  HPI The patient is a 66 year old female who I've been asked to see for chronic cough.  She has had chronic cough issues in the past, but had done well until September of this year when it started spontaneously.  It is dry and hacking in nature, and she can have cough paroxysms that lead to gagging.  She complains of a tickle in her throat, and her husband notes frequent throat clearing.  She does have postnasal drip, but denies worsening reflux symptoms.  She does have a history of gastroparesis and GERD.  The cough is better with hard candies such as a cough drop, and she cannot come up with any obvious worsening factors.  She has not had any history of asthma, and has not had a recent chest x-ray.  She did have a chest x-ray in April of this year which showed prominent interstitial markings, but a chest CT from 2010 did not show interstitial lung disease.  The patient is a never smoker.  She does have a history of breast cancer from 1995, but has not had any known recurrence.   Review of Systems  Constitutional: Negative for fever and unexpected weight change.  HENT: Positive for sore throat, sneezing and postnasal drip. Negative for ear pain, nosebleeds, congestion, rhinorrhea, trouble swallowing, dental problem and sinus pressure.   Eyes: Negative for redness and itching.  Respiratory: Positive for cough. Negative for chest tightness, shortness of breath and wheezing.   Cardiovascular: Negative for palpitations and leg swelling.  Gastrointestinal: Negative for nausea and vomiting.  Genitourinary: Negative for dysuria.  Musculoskeletal: Negative for joint swelling.  Skin: Negative for rash.  Neurological: Positive for headaches.  Hematological: Does not bruise/bleed easily.  Psychiatric/Behavioral: Negative for dysphoric mood. The patient is not nervous/anxious.        Objective:   Physical  Exam Constitutional:  Well developed, no acute distress  HENT:  Nares patent without discharge  Oropharynx without exudate, palate and uvula are normal  Eyes:  Perrla, eomi, no scleral icterus  Neck:  No JVD, no TMG  Cardiovascular:  Normal rate, regular rhythm, no rubs or gallops.  3/6 sem        Intact distal pulses  Pulmonary :  Normal breath sounds, no stridor or respiratory distress   No rales, rhonchi, or wheezing  Abdominal:  Soft, nondistended, bowel sounds present.  No tenderness noted.   Musculoskeletal:  No lower extremity edema noted.  Lymph Nodes:  No cervical lymphadenopathy noted  Skin:  No cyanosis noted  Neurologic:  Alert, appropriate, moves all 4 extremities without obvious deficit.         Assessment & Plan:

## 2012-09-17 NOTE — Assessment & Plan Note (Signed)
The patient has had a chronic cough of 3 months duration that I suspect his upper airway in origin.  Her lungs are totally clear today, her spirometry is normal, and we will check a chest x-ray for completeness.  More than likely, this is a cyclical cough with a component of postnasal drip and/or reflux.  I would like to treat her aggressively for upper airway causes of cough, including additional treatment for postnasal drip and reflux.  I have also reviewed with her the behavioral therapies which helped with cyclical coughing.  She will call us in 2 weeks with an update on how things are going.

## 2012-09-19 ENCOUNTER — Other Ambulatory Visit (HOSPITAL_COMMUNITY): Payer: Self-pay | Admitting: Internal Medicine

## 2012-09-19 DIAGNOSIS — Z139 Encounter for screening, unspecified: Secondary | ICD-10-CM

## 2012-10-01 ENCOUNTER — Telehealth: Payer: Self-pay | Admitting: Pulmonary Disease

## 2012-10-01 NOTE — Telephone Encounter (Signed)
Spoke with pt She states calling to let [facility] know that there has been no change in her cough since last visit on 09/17/12 She states still taking omeprazole bid, chlor tabs and tessalon as directed She states cough no worse, but no better She is aware that [facility] out of the office until 10/04/12 and is fine with waiting until then for recs  Please advise, thanks!

## 2012-10-04 ENCOUNTER — Ambulatory Visit (INDEPENDENT_AMBULATORY_CARE_PROVIDER_SITE_OTHER): Payer: Medicare Other | Admitting: *Deleted

## 2012-10-04 ENCOUNTER — Other Ambulatory Visit: Payer: Self-pay | Admitting: Pulmonary Disease

## 2012-10-04 DIAGNOSIS — Z7901 Long term (current) use of anticoagulants: Secondary | ICD-10-CM

## 2012-10-04 DIAGNOSIS — R05 Cough: Secondary | ICD-10-CM

## 2012-10-04 DIAGNOSIS — I6350 Cerebral infarction due to unspecified occlusion or stenosis of unspecified cerebral artery: Secondary | ICD-10-CM

## 2012-10-04 DIAGNOSIS — I639 Cerebral infarction, unspecified: Secondary | ICD-10-CM

## 2012-10-04 MED ORDER — BENZONATATE 100 MG PO CAPS: 200.0000 mg | ORAL_CAPSULE | Freq: Four times a day (QID) | ORAL | Status: DC | PRN

## 2012-10-04 NOTE — Telephone Encounter (Signed)
Pt doesn't feel her cough is any better.  I have asked her to continue on hard candy, antihistamine, med for reflux disease.  Will order a scan of her sinuses, and if negative, will proceed with methacholine challenge.   Please send in a prescription for tessalon pearls 100mg   Take 2 every 6 hrs if needed.  #30, no fills.

## 2012-10-04 NOTE — Telephone Encounter (Addendum)
RX sent. KC has ordered the sinus scan.

## 2012-10-09 ENCOUNTER — Ambulatory Visit (INDEPENDENT_AMBULATORY_CARE_PROVIDER_SITE_OTHER)
Admission: RE | Admit: 2012-10-09 | Discharge: 2012-10-09 | Disposition: A | Discharge status: Home / Self Care | Payer: Medicare Other | Source: Ambulatory Visit | Attending: Pulmonary Disease | Admitting: Pulmonary Disease

## 2012-10-09 ENCOUNTER — Inpatient Hospital Stay: Admission: RE | Admit: 2012-10-09 | Payer: Medicare Other | Source: Ambulatory Visit

## 2012-10-09 DIAGNOSIS — R05 Cough: Secondary | ICD-10-CM

## 2012-10-10 ENCOUNTER — Other Ambulatory Visit: Payer: Self-pay | Admitting: Pulmonary Disease

## 2012-10-10 DIAGNOSIS — R05 Cough: Secondary | ICD-10-CM

## 2012-10-16 ENCOUNTER — Ambulatory Visit (HOSPITAL_COMMUNITY)
Admission: RE | Admit: 2012-10-16 | Discharge: 2012-10-16 | Disposition: A | Discharge status: Home / Self Care | Payer: Medicare Other | Source: Ambulatory Visit | Attending: Pulmonary Disease | Admitting: Pulmonary Disease

## 2012-10-16 DIAGNOSIS — R05 Cough: Secondary | ICD-10-CM

## 2012-10-16 DIAGNOSIS — R059 Cough, unspecified: Secondary | ICD-10-CM | POA: Insufficient documentation

## 2012-10-16 LAB — PULMONARY FUNCTION TEST

## 2012-10-16 MED ORDER — METHACHOLINE 1 MG/ML NEB SOLN
2.0000 mL | Freq: Once | RESPIRATORY_TRACT | Status: AC
  Administered 2012-10-16: 2 mg via RESPIRATORY_TRACT

## 2012-10-16 MED ORDER — SODIUM CHLORIDE 0.9 % IN NEBU
3.0000 mL | INHALATION_SOLUTION | Freq: Once | RESPIRATORY_TRACT | Status: AC
  Administered 2012-10-16: 3 mL via RESPIRATORY_TRACT

## 2012-10-16 MED ORDER — METHACHOLINE 4 MG/ML NEB SOLN
2.0000 mL | Freq: Once | RESPIRATORY_TRACT | Status: AC
  Administered 2012-10-16: 8 mg via RESPIRATORY_TRACT

## 2012-10-16 MED ORDER — METHACHOLINE 0.25 MG/ML NEB SOLN
2.0000 mL | Freq: Once | RESPIRATORY_TRACT | Status: AC
  Administered 2012-10-16: 0.5 mg via RESPIRATORY_TRACT

## 2012-10-16 MED ORDER — METHACHOLINE 16 MG/ML NEB SOLN
2.0000 mL | Freq: Once | RESPIRATORY_TRACT | Status: AC
  Administered 2012-10-16: 32 mg via RESPIRATORY_TRACT

## 2012-10-16 MED ORDER — METHACHOLINE 0.0625 MG/ML NEB SOLN
2.0000 mL | Freq: Once | RESPIRATORY_TRACT | Status: AC
  Administered 2012-10-16: 0.125 mg via RESPIRATORY_TRACT

## 2012-10-16 MED ORDER — ALBUTEROL SULFATE (5 MG/ML) 0.5% IN NEBU
2.5000 mg | INHALATION_SOLUTION | Freq: Once | RESPIRATORY_TRACT | Status: AC
  Administered 2012-10-16: 2.5 mg via RESPIRATORY_TRACT

## 2012-10-18 ENCOUNTER — Telehealth: Payer: Self-pay | Admitting: Pulmonary Disease

## 2012-10-18 ENCOUNTER — Encounter (HOSPITAL_COMMUNITY): Payer: Medicare Other

## 2012-10-18 NOTE — Telephone Encounter (Signed)
Pt had methacholine challenge on 10/16/12 @ [facility] and results have not been scanned into the chart yet. Ashtyn, pls check to see if the hardcopy has been sent over for [facility] to review. Pt aware someone will call her with the results once [facility] has a chance to look them over. Thanks!

## 2012-10-19 NOTE — Telephone Encounter (Signed)
Spoke with Marcelino Duster at [facility] and stated that she is going to fax a preliminary copy for [facility] to review.  Msg to be sent once paper is received.

## 2012-10-22 NOTE — Telephone Encounter (Signed)
Pt aware of results. She will come by on wed  10/24/12 to be shown how to use the inhaler. She is scheduled to see KC in 3 weeks. Inhaler sample has been left upfront where samples are for pt's to p/u with her name on bag. Will sign off message

## 2012-10-22 NOTE — Telephone Encounter (Signed)
Please let pt know that her methacholine challenge test is abnormal.  Asthma may be contributing to her cough.  Would like her to stay on antihistamine at bedtime, decrease omeprazole to once a day, and will start her on qvar as a trial to see if cough improves. Have her come by office to get sample and shown how to use inhaler.  If no samples, give script after showing her how to use. qvar  2 inhalations am and pm.  Rinse mouth well.  I need to see her in 3 weeks to check on progress.

## 2012-10-22 NOTE — Telephone Encounter (Signed)
Methacholine Challenge results are in Tzirel Leonor folder for review.

## 2012-10-24 ENCOUNTER — Ambulatory Visit (INDEPENDENT_AMBULATORY_CARE_PROVIDER_SITE_OTHER): Payer: Medicare Other

## 2012-10-24 DIAGNOSIS — R0989 Other specified symptoms and signs involving the circulatory and respiratory systems: Secondary | ICD-10-CM

## 2012-10-24 NOTE — Progress Notes (Signed)
Pt came in for inhaler teaching per phone msg from 10/18/12.  Pt was given inhaler technique.  Please see phone msg from 10/18/12 for additional information.  This encounter created to charge for this teaching.

## 2012-10-24 NOTE — Telephone Encounter (Signed)
Pt came by to pick up qvar sample.  I have instructed her on qvar inhaler technique.  She is aware to do 2 puffs bid.  She verbalized understanding of this and is aware to call back with any questions, concerns, or if she has any problems with this medication.

## 2012-10-29 ENCOUNTER — Ambulatory Visit (HOSPITAL_COMMUNITY)
Admission: RE | Admit: 2012-10-29 | Discharge: 2012-10-29 | Disposition: A | Discharge status: Home / Self Care | Payer: Medicare Other | Source: Ambulatory Visit | Attending: Internal Medicine | Admitting: Internal Medicine

## 2012-10-29 DIAGNOSIS — Z139 Encounter for screening, unspecified: Secondary | ICD-10-CM

## 2012-10-29 DIAGNOSIS — Z1231 Encounter for screening mammogram for malignant neoplasm of breast: Secondary | ICD-10-CM | POA: Insufficient documentation

## 2012-11-02 ENCOUNTER — Other Ambulatory Visit: Payer: Self-pay | Admitting: Internal Medicine

## 2012-11-02 DIAGNOSIS — R928 Other abnormal and inconclusive findings on diagnostic imaging of breast: Secondary | ICD-10-CM

## 2012-11-14 ENCOUNTER — Ambulatory Visit (INDEPENDENT_AMBULATORY_CARE_PROVIDER_SITE_OTHER): Payer: Medicare Other | Admitting: *Deleted

## 2012-11-14 ENCOUNTER — Encounter: Payer: Self-pay | Admitting: Pulmonary Disease

## 2012-11-14 ENCOUNTER — Ambulatory Visit (INDEPENDENT_AMBULATORY_CARE_PROVIDER_SITE_OTHER): Payer: Medicare Other | Admitting: Pulmonary Disease

## 2012-11-14 VITALS — BP 122/72 | HR 61 | Temp 97.9°F | Ht 62.5 in | Wt 163.4 lb

## 2012-11-14 DIAGNOSIS — I639 Cerebral infarction, unspecified: Secondary | ICD-10-CM

## 2012-11-14 DIAGNOSIS — I6350 Cerebral infarction due to unspecified occlusion or stenosis of unspecified cerebral artery: Secondary | ICD-10-CM

## 2012-11-14 DIAGNOSIS — R05 Cough: Secondary | ICD-10-CM

## 2012-11-14 DIAGNOSIS — Z7901 Long term (current) use of anticoagulants: Secondary | ICD-10-CM

## 2012-11-14 NOTE — Patient Instructions (Addendum)
Stay on qvar 2 puffs twice a day everyday.  Rinse mouth well.  Will send in a prescription for you. Stay on chlorpheniramine 4mg , take 2 at bedtime.  If you are having persistent symptoms during day, can take zyrtec 10mg  otc in the mornings if needed. followup with me in 6mos, but call if not doing well.

## 2012-11-14 NOTE — Progress Notes (Signed)
  Subjective:    Patient ID: [name], female    DOB: 21-May-1946, 67 y.o.   MRN: [id number]  HPI Patient comes in today for followup of her chronic cough.  She failed to respond to aggressive treatment of postnasal drip and reflux, and underwent a scan of her sinuses which showed no significant chronic sinus disease.  She then underwent a methacholine challenge, and this was positive for hyperreactive airways.  She was started on Qvar as a trial, and has seen significant improvement in her cough.  The patient states that her cough is at least 70% better from the last visit.  She is continuing to have postnasal drip despite taking chlorpheniramine at bedtime, but believes this clearly improves her symptoms.  She tells me that her reflux symptoms are well controlled currently.   Review of Systems  Constitutional: Negative for fever and unexpected weight change.  HENT: Positive for rhinorrhea. Negative for ear pain, nosebleeds, congestion, sore throat, sneezing, trouble swallowing, dental problem, postnasal drip and sinus pressure.   Eyes: Negative for redness and itching.  Respiratory: Positive for cough. Negative for chest tightness, shortness of breath and wheezing.   Cardiovascular: Negative for palpitations and leg swelling.  Gastrointestinal: Negative for nausea and vomiting.  Genitourinary: Negative for dysuria.  Musculoskeletal: Negative for joint swelling.  Skin: Negative for rash.  Neurological: Negative for headaches.  Hematological: Does not bruise/bleed easily.  Psychiatric/Behavioral: Negative for dysphoric mood. The patient is not nervous/anxious.        Objective:   Physical Exam Well-developed female in no acute distress Nose without purulent discharge noted, rhinorrhea noted. Neck without lymphadenopathy or thyromegaly Chest totally clear to auscultation with no wheezing Cardiac exam with regular rate and rhythm Lower extremities without edema, no cyanosis Alert  and oriented, moves all 4 extremities.       Assessment & Plan:

## 2012-11-14 NOTE — Assessment & Plan Note (Signed)
The patient had a positive methacholine challenge test which is most consistent with cough variant asthma.  The other possibility is whether reflux and chronic rhinitis could also lead to hyperreactive airways.  She has seen considerable improvement in her cough with the Qvar, and she needs to continue on this.  She is not having any reflux issues currently, but continues to have postnasal drip and rhinorrhea despite being on chlorpheniramine.  Will add a nonsedating antihistamine during the daytime hours, and consider nasal corticosteroids if she continues to have symptoms.

## 2012-11-19 ENCOUNTER — Telehealth: Payer: Self-pay | Admitting: Pulmonary Disease

## 2012-11-19 MED ORDER — BECLOMETHASONE DIPROPIONATE 80 MCG/ACT IN AERS: 2.0000 | INHALATION_SPRAY | Freq: Two times a day (BID) | RESPIRATORY_TRACT | Status: DC

## 2012-11-19 NOTE — Telephone Encounter (Signed)
Spoke with pt requesting rx for qvar be sent to pharmacy. rx sent an pt is aware. Nothing further needed.

## 2012-11-21 ENCOUNTER — Ambulatory Visit (HOSPITAL_COMMUNITY)
Admission: RE | Admit: 2012-11-21 | Discharge: 2012-11-21 | Disposition: A | Discharge status: Home / Self Care | Payer: Medicare Other | Source: Ambulatory Visit | Attending: Internal Medicine | Admitting: Internal Medicine

## 2012-11-21 ENCOUNTER — Other Ambulatory Visit: Payer: Self-pay | Admitting: Internal Medicine

## 2012-11-21 DIAGNOSIS — R928 Other abnormal and inconclusive findings on diagnostic imaging of breast: Secondary | ICD-10-CM

## 2012-11-21 DIAGNOSIS — Z853 Personal history of malignant neoplasm of breast: Secondary | ICD-10-CM | POA: Insufficient documentation

## 2012-11-21 DIAGNOSIS — N6009 Solitary cyst of unspecified breast: Secondary | ICD-10-CM | POA: Insufficient documentation

## 2012-12-26 ENCOUNTER — Ambulatory Visit (INDEPENDENT_AMBULATORY_CARE_PROVIDER_SITE_OTHER): Payer: Medicare Other | Admitting: *Deleted

## 2012-12-26 DIAGNOSIS — I6350 Cerebral infarction due to unspecified occlusion or stenosis of unspecified cerebral artery: Secondary | ICD-10-CM

## 2012-12-26 DIAGNOSIS — I639 Cerebral infarction, unspecified: Secondary | ICD-10-CM

## 2012-12-26 DIAGNOSIS — Z7901 Long term (current) use of anticoagulants: Secondary | ICD-10-CM

## 2013-01-16 ENCOUNTER — Ambulatory Visit (INDEPENDENT_AMBULATORY_CARE_PROVIDER_SITE_OTHER): Payer: Medicare Other | Admitting: *Deleted

## 2013-01-16 DIAGNOSIS — Z7901 Long term (current) use of anticoagulants: Secondary | ICD-10-CM

## 2013-01-16 DIAGNOSIS — I639 Cerebral infarction, unspecified: Secondary | ICD-10-CM

## 2013-01-16 DIAGNOSIS — I6350 Cerebral infarction due to unspecified occlusion or stenosis of unspecified cerebral artery: Secondary | ICD-10-CM

## 2013-02-11 ENCOUNTER — Telehealth: Payer: Self-pay | Admitting: *Deleted

## 2013-02-11 NOTE — Telephone Encounter (Signed)
Patient is calling to let us know she's having a tooth extraction Wednesday, Feb 14, 2012.  She states she has an appointment here at 8:40 Wed and would like to know if there's anything she needs to do before the extraction.  Please give her a call asap.

## 2013-02-11 NOTE — Telephone Encounter (Signed)
States dentist knows she is on coumadin.  H/O embolic stoke.  Will continue coumadin at current dose and check INR before appt on 5/14.

## 2013-02-13 ENCOUNTER — Ambulatory Visit (INDEPENDENT_AMBULATORY_CARE_PROVIDER_SITE_OTHER): Payer: Medicare Other | Admitting: *Deleted

## 2013-02-13 DIAGNOSIS — Z7901 Long term (current) use of anticoagulants: Secondary | ICD-10-CM

## 2013-02-13 DIAGNOSIS — I6350 Cerebral infarction due to unspecified occlusion or stenosis of unspecified cerebral artery: Secondary | ICD-10-CM

## 2013-02-13 DIAGNOSIS — I639 Cerebral infarction, unspecified: Secondary | ICD-10-CM

## 2013-02-13 NOTE — Patient Instructions (Signed)
Dr Raford Pitcher would like for coumadin to be below 2.5 to pull tooth.  Pt will hold coumadin tonight and have tooth pulled tomorrow.

## 2013-03-13 ENCOUNTER — Ambulatory Visit (INDEPENDENT_AMBULATORY_CARE_PROVIDER_SITE_OTHER): Payer: Medicare Other | Admitting: *Deleted

## 2013-03-13 DIAGNOSIS — I639 Cerebral infarction, unspecified: Secondary | ICD-10-CM

## 2013-03-13 DIAGNOSIS — I6350 Cerebral infarction due to unspecified occlusion or stenosis of unspecified cerebral artery: Secondary | ICD-10-CM

## 2013-03-13 DIAGNOSIS — Z7901 Long term (current) use of anticoagulants: Secondary | ICD-10-CM

## 2013-03-13 LAB — POCT INR: INR: 5.7

## 2013-03-18 ENCOUNTER — Ambulatory Visit (INDEPENDENT_AMBULATORY_CARE_PROVIDER_SITE_OTHER): Payer: Medicare Other | Admitting: *Deleted

## 2013-03-18 DIAGNOSIS — I6350 Cerebral infarction due to unspecified occlusion or stenosis of unspecified cerebral artery: Secondary | ICD-10-CM

## 2013-03-18 DIAGNOSIS — I639 Cerebral infarction, unspecified: Secondary | ICD-10-CM

## 2013-03-18 DIAGNOSIS — Z7901 Long term (current) use of anticoagulants: Secondary | ICD-10-CM

## 2013-03-18 LAB — POCT INR: INR: 1.5

## 2013-03-22 ENCOUNTER — Telehealth: Payer: Self-pay | Admitting: Pulmonary Disease

## 2013-03-22 NOTE — Telephone Encounter (Signed)
I spoke with pt. She stated the QVAR has not helped with her cough at all since she has been on this inhaler. She has had bronchitis since she saw [place] last and her PCP gave her ABX injection. She wants to wait until [facility] is back in the office for recs. Please advise thanks   No Known Allergies

## 2013-03-25 NOTE — Telephone Encounter (Signed)
When I saw her in Feb, she told me her cough was 70% improved on qvar.  We were also treating her at that time for reflux and postnasal drip.  I think she needs to have an OV with me.  Let's get her in this week if possible.

## 2013-03-26 NOTE — Telephone Encounter (Signed)
Pt scheduled for OV with [facility] 03/27/13  2pm

## 2013-03-26 NOTE — Telephone Encounter (Addendum)
LMOM x 1 Pt needs OV--- please schedule her in 2pm spot on KC schedule 03/27/13

## 2013-03-26 NOTE — Telephone Encounter (Signed)
Patient returning call.

## 2013-03-27 ENCOUNTER — Encounter: Payer: Self-pay | Admitting: Pulmonary Disease

## 2013-03-27 ENCOUNTER — Ambulatory Visit (INDEPENDENT_AMBULATORY_CARE_PROVIDER_SITE_OTHER): Payer: Medicare Other | Admitting: Pulmonary Disease

## 2013-03-27 VITALS — BP 104/60 | HR 71 | Temp 97.1°F | Ht 62.5 in | Wt 170.8 lb

## 2013-03-27 DIAGNOSIS — R05 Cough: Secondary | ICD-10-CM

## 2013-03-27 DIAGNOSIS — J45991 Cough variant asthma: Secondary | ICD-10-CM | POA: Insufficient documentation

## 2013-03-27 MED ORDER — PREDNISONE 10 MG PO TABS: ORAL_TABLET | ORAL | Status: DC

## 2013-03-27 NOTE — Assessment & Plan Note (Signed)
The patient has been staying compliant on her Qvar, and I have asked her to continue this.

## 2013-03-27 NOTE — Assessment & Plan Note (Signed)
The patient has a chronic cough that I suspect is secondary to cough variant asthma and also upper airway cyclical coughing.  She may also have a component related to postnasal drip and acid reflux.  At this point, I would like to treat her with a short course of prednisone for inflammation, and also treat for reflux and postnasal drip more aggressively.  I also reviewed the behavioral therapies for cyclical coughing.

## 2013-03-27 NOTE — Patient Instructions (Addendum)
Stay on your qvar twice a day as you are doing. Will treat with short course of prednisone over 6days for airway inflammation Use hard candy during day, no throat clearing, use cough syrup if you get into coughing jags. Increase your acid reflux medication to am and pm for next 7 days Take chlorpheniramine 4mg  OTC, 2 tabs at bedtime and one at lunch for next 7 days.  Can then take as needed for postnasal drip. Please call me in 2 weeks with how things are going.

## 2013-03-27 NOTE — Progress Notes (Signed)
  Subjective:    Patient ID: [name], female    DOB: 04-12-1946, 67 y.o.   MRN: [id number]  HPI Patient comes in today for an acute flare of her known chronic cough.  She has asthma based on a methacholine challenge test, and saw a 70% improvement in her cough with initiating inhaled corticosteroids.  She is stay on this medication religiously, but recently had what sounds like a viral URI.  Her cough escalated, and has not resolved in the last month or so.  She is having classic cough paroxysms, and also postnasal drip.  She initially felt that her exertional tolerance has declined, but now feels it is almost back to baseline.  She feels that her cough is an 8-10 out of 10.  She has no mucus or purulence at this time.   Review of Systems  Constitutional: Negative for fever and unexpected weight change.  HENT: Negative for ear pain, nosebleeds, congestion, sore throat, rhinorrhea, sneezing, trouble swallowing, dental problem, postnasal drip and sinus pressure.   Eyes: Negative for redness and itching.  Respiratory: Positive for cough and shortness of breath. Negative for chest tightness and wheezing.   Cardiovascular: Negative for palpitations and leg swelling.  Gastrointestinal: Negative for nausea and vomiting.  Genitourinary: Negative for dysuria.  Musculoskeletal: Negative for joint swelling.  Skin: Negative for rash.  Neurological: Negative for headaches.  Hematological: Does not bruise/bleed easily.  Psychiatric/Behavioral: Negative for dysphoric mood. The patient is not nervous/anxious.        Objective:   Physical Exam Well-developed female in no acute distress Nose without purulence or discharge noted Oropharynx clear Neck without lymphadenopathy or thyromegaly Chest totally clear to auscultation, no wheezing Cardiac exam with regular rate and rhythm Lower extremities without edema, cyanosis Alert and oriented, moves all 4 extremities.       Assessment & Plan:

## 2013-03-28 ENCOUNTER — Telehealth: Payer: Self-pay | Admitting: *Deleted

## 2013-03-28 NOTE — Telephone Encounter (Signed)
Pt called stating she was started on prednisone 10mg  (4 tablets x 2 days, 3 tablets x 2 days, 2 tablets x 2 days) for cough.  Told pt to take coumadin 1/2 tablet tonight and tomorrow night then resume regular dose.  Has appt for INR check on 6/30.

## 2013-04-01 ENCOUNTER — Ambulatory Visit (INDEPENDENT_AMBULATORY_CARE_PROVIDER_SITE_OTHER): Payer: Medicare Other | Admitting: *Deleted

## 2013-04-01 DIAGNOSIS — I6350 Cerebral infarction due to unspecified occlusion or stenosis of unspecified cerebral artery: Secondary | ICD-10-CM

## 2013-04-01 DIAGNOSIS — I639 Cerebral infarction, unspecified: Secondary | ICD-10-CM

## 2013-04-01 DIAGNOSIS — Z7901 Long term (current) use of anticoagulants: Secondary | ICD-10-CM

## 2013-04-01 LAB — POCT INR: INR: 3

## 2013-04-01 MED ORDER — RIVAROXABAN 20 MG PO TABS: 20.0000 mg | ORAL_TABLET | Freq: Every day | ORAL | Status: DC

## 2013-04-18 ENCOUNTER — Telehealth: Payer: Self-pay | Admitting: Pulmonary Disease

## 2013-04-18 NOTE — Telephone Encounter (Signed)
I spoke with pt and calling to give update on cough. She stated her cough was better after seeing KC x 1 1/2 weeks. Then after that her cough went downhill. She stated on Sunday she had to leave church bc she could not stop coughing. It is a dry cough. She was using sugarless candy to help bathe the back of her throat and still was not helping. The past 2 nights she has been taking hydromet cough syrup d/t coughing at night. She is taking QVAR 2 puffs BID, chlor 4 mg 1 at lunch and 2 at bedtime. Per pt she denies any PND. She is wanting KC to address this since he knows her case and has a TX plan for her. She is aware he is off this afternoon but is fine with a call back tomorrow. Please advise KC thanks   No Known Allergies

## 2013-04-19 MED ORDER — PREDNISONE 20 MG PO TABS: ORAL_TABLET | ORAL | Status: DC

## 2013-04-19 NOTE — Telephone Encounter (Signed)
Pt aware of recs. RX sent 

## 2013-04-19 NOTE — Telephone Encounter (Signed)
Let her know it is unclear how much her current cough is related to her asthma, and how much related to upper airway? Would like to treat her with one more course of prednisone, and have her increase her acid reflux med to twice a day for the next 4 weeks.  If she gets better, then worsens, may have to intensify her asthma treatment.   Prednisone 20mg :  Take 2 each day for 3 days, 1 1/2 a day for 3 days, then one a day for 3 days, then stop. Let us know if recurrence of symptoms.

## 2013-04-24 ENCOUNTER — Telehealth: Payer: Self-pay | Admitting: Pulmonary Disease

## 2013-04-24 MED ORDER — TRAMADOL HCL 50 MG PO TABS: 50.0000 mg | ORAL_TABLET | Freq: Three times a day (TID) | ORAL | Status: DC | PRN

## 2013-04-24 NOTE — Telephone Encounter (Signed)
Spoke with the pt and notified of recs per KC  She verbalized understanding  Rx was sent to pharm   

## 2013-04-24 NOTE — Telephone Encounter (Signed)
Would try her on tramadol 50mg  one every 8 hrs scheduled for about 3-4 days, then as needed for cough.  Lets try this for one week. If cough persists, needs ov with TP Tramadol 50mg  one q8hrs, #30 with one fill.

## 2013-04-24 NOTE — Telephone Encounter (Signed)
Spoke with the pt She states her cough is just the same since last ov 03/27/13- no better or worse. No new co's   She is following all recs below per [facility] Patient Instructions    Stay on your qvar twice a day as you are doing.  Will treat with short course of prednisone over 6days for airway inflammation  Use hard candy during day, no throat clearing, use cough syrup if you get into coughing jags.  Increase your acid reflux medication to am and pm for next 7 days  Take chlorpheniramine 4mg  OTC, 2 tabs at bedtime and one at lunch for next 7 days. Can then take as needed for postnasal drip.  Please call me in 2 weeks with how things are going.     She says that [facility] offered to call in hydromet if she was not improving  Please advise, thanks!

## 2013-05-01 ENCOUNTER — Ambulatory Visit: Payer: Medicare Other | Admitting: *Deleted

## 2013-05-01 ENCOUNTER — Other Ambulatory Visit: Payer: Self-pay | Admitting: Cardiology

## 2013-05-01 ENCOUNTER — Encounter: Payer: Self-pay | Admitting: Cardiology

## 2013-05-01 ENCOUNTER — Telehealth: Payer: Self-pay | Admitting: Cardiology

## 2013-05-01 DIAGNOSIS — I10 Essential (primary) hypertension: Secondary | ICD-10-CM

## 2013-05-01 DIAGNOSIS — Z7901 Long term (current) use of anticoagulants: Secondary | ICD-10-CM

## 2013-05-01 DIAGNOSIS — I4891 Unspecified atrial fibrillation: Secondary | ICD-10-CM

## 2013-05-01 DIAGNOSIS — I639 Cerebral infarction, unspecified: Secondary | ICD-10-CM

## 2013-05-01 LAB — CBC
Hemoglobin: 13.3 g/dL (ref 12.0–15.0)
MCH: 29 pg (ref 26.0–34.0)
Platelets: 178 10*3/uL (ref 150–400)
RBC: 4.59 MIL/uL (ref 3.87–5.11)
WBC: 12.3 10*3/uL — ABNORMAL HIGH (ref 4.0–10.5)

## 2013-05-01 NOTE — Telephone Encounter (Signed)
Dr. Lucky Cowboy requests anticoagulation management recommendations prior to and following dental implant surgery. Rivaroxaban can be discontinued 48 hours prior to the procedure and resume 24 hours after the procedure if hemostasis has been achieved.

## 2013-05-02 LAB — BASIC METABOLIC PANEL
CO2: 27 mEq/L (ref 19–32)
Calcium: 9.4 mg/dL (ref 8.4–10.5)
Chloride: 99 mEq/L (ref 96–112)
Potassium: 4.7 mEq/L (ref 3.5–5.3)
Sodium: 137 mEq/L (ref 135–145)

## 2013-05-02 NOTE — Patient Instructions (Signed)
1 month Xarelto follow up:  Pt was started on Xarelto 04/01/13 by Dr Dietrich Pates for CVA.  Pt is tolerating medication well without s/s of GI upset, bleeding or increased bruising.  Labs including CBC and BMet obtained on 05/01/13.  Hgb 13.3  Hct 40.9  Plts 178   BUN 16  SrCr 1.29  CrCl=50.7.  Pt was notified of results.  She will continue Xarelto 20mg  daily with evening meal and F/U in 63mo.  Reminder put in recall.  Authorization from [employer] pending.

## 2013-05-03 ENCOUNTER — Encounter: Payer: Self-pay | Admitting: Cardiology

## 2013-05-03 ENCOUNTER — Encounter: Payer: Self-pay | Admitting: *Deleted

## 2013-05-06 ENCOUNTER — Telehealth: Payer: Self-pay | Admitting: *Deleted

## 2013-05-06 NOTE — Telephone Encounter (Signed)
Called pt to advise her xarelto has been approved per [facility]2014 pt aware and has already picked up her medication

## 2013-05-08 ENCOUNTER — Other Ambulatory Visit: Payer: Self-pay

## 2013-05-14 ENCOUNTER — Encounter: Payer: Self-pay | Admitting: Pulmonary Disease

## 2013-05-14 ENCOUNTER — Ambulatory Visit (INDEPENDENT_AMBULATORY_CARE_PROVIDER_SITE_OTHER): Payer: Medicare Other | Admitting: Pulmonary Disease

## 2013-05-14 ENCOUNTER — Encounter: Payer: Self-pay | Admitting: Gastroenterology

## 2013-05-14 VITALS — BP 122/82 | HR 79 | Temp 98.0°F | Ht 62.0 in | Wt 174.2 lb

## 2013-05-14 DIAGNOSIS — R05 Cough: Secondary | ICD-10-CM

## 2013-05-14 NOTE — Assessment & Plan Note (Signed)
[name] that at this point is clearly upper airway in origin.  [name], but her current cough failed to respond to prednisone tapers.  Therefore it is unlikely that it is related to cough variant asthma.  She does not have chronic sinusitis by her CT scan, and there is nothing on chest x-ray earlier in the year to explain her cough.  She does have a history of breast cancer, but there has been no evidence for recurrence.  [name], but this has an extremely low yield.  [name], and therefore I am concerned she may be having laryngopharyngeal reflux as a cause for her cough.  I also think she may have a cyclical mechanism as well she did have some response to tramadol, and we could consider a trial of gabapentin.  However, I do think she needs another GI evaluation for possible reflux disease contributing to her cough.  She may need pH probe testing.

## 2013-05-14 NOTE — Patient Instructions (Addendum)
Continue on qvar, and will add spacer to help with thrush Continue to use tramadol as needed for cough Continue antihistamine if you are having postnasal drip, and use hard candy during day Minimize voice use. Will refer to Dr. Jarold Motto upstairs who you have seen for gastroparesis to see if ongoing reflux is the issue. Will await his recommendations before proceeding further from my standpoint.

## 2013-05-14 NOTE — Progress Notes (Signed)
  Subjective:    Patient ID: [name], female    DOB: 1946-08-26, 67 y.o.   MRN: [id number]  HPI Patient comes in today for persistent chronic cough.  She has been found to have cough variant asthma, and did have an initial response to Qvar.  Her cough subsequently recurred, and did not even respond to prednisone tapers.  Her current symptoms are most consistent with an upper airway source of cough, and she is clearly having paroxysms at times.  She is continuing with heart and the use and minimization of voice.  She does clear her throat a lot, and her husband tries to remind her not to do so.  She has a history of gastroparesis, but denies gastroesophageal reflux symptoms currently.  However, I explained this does not exclude LPR.  She was on higher doses of proton pump inhibitor and also H2 blocker prior to establishing her diagnosis of cough bearing asthma.   Review of Systems  Constitutional: Negative for fever and unexpected weight change.  HENT: Negative for ear pain, nosebleeds, congestion, sore throat, rhinorrhea, sneezing, trouble swallowing, dental problem, postnasal drip and sinus pressure.   Eyes: Negative for redness and itching.  Respiratory: Positive for cough. Negative for chest tightness, shortness of breath and wheezing.   Cardiovascular: Negative for palpitations and leg swelling.  Gastrointestinal: Negative for nausea and vomiting.  Genitourinary: Negative for dysuria.  Musculoskeletal: Negative for joint swelling.  Skin: Negative for rash.  Neurological: Negative for headaches.  Hematological: Does not bruise/bleed easily.  Psychiatric/Behavioral: Negative for dysphoric mood. The patient is not nervous/anxious.        Objective:   Physical Exam Well-developed female in no acute distress Nose without purulence or discharge noted Oropharynx clear Neck without lymphadenopathy or thyromegaly Chest totally clear to auscultation Cardiac exam with regular rate and  rhythm Lower extremities without edema, cyanosis Alert and oriented, moves all 4 extremities.       Assessment & Plan:

## 2013-05-23 ENCOUNTER — Other Ambulatory Visit: Payer: Self-pay | Admitting: *Deleted

## 2013-05-23 MED ORDER — TRAMADOL HCL 50 MG PO TABS: 50.0000 mg | ORAL_TABLET | Freq: Three times a day (TID) | ORAL | Status: DC | PRN

## 2013-05-24 ENCOUNTER — Telehealth: Payer: Self-pay | Admitting: *Deleted

## 2013-05-24 MED ORDER — TRAMADOL HCL 50 MG PO TABS: 50.0000 mg | ORAL_TABLET | Freq: Three times a day (TID) | ORAL | Status: DC | PRN

## 2013-05-24 NOTE — Telephone Encounter (Signed)
Per KC: Change Tramadol 50mg  Rx to zero refills.  To be followed by GI Physician after this Rx.

## 2013-05-27 ENCOUNTER — Encounter: Payer: Self-pay | Admitting: *Deleted

## 2013-05-30 ENCOUNTER — Ambulatory Visit (INDEPENDENT_AMBULATORY_CARE_PROVIDER_SITE_OTHER): Payer: Medicare Other | Admitting: Gastroenterology

## 2013-05-30 ENCOUNTER — Encounter: Payer: Self-pay | Admitting: Gastroenterology

## 2013-05-30 VITALS — BP 130/62 | HR 80 | Ht 62.25 in | Wt 171.4 lb

## 2013-05-30 DIAGNOSIS — R05 Cough: Secondary | ICD-10-CM

## 2013-05-30 MED ORDER — DEXLANSOPRAZOLE 60 MG PO CPDR: 60.0000 mg | DELAYED_RELEASE_CAPSULE | Freq: Every day | ORAL | Status: DC

## 2013-05-30 NOTE — Progress Notes (Signed)
History of Present Illness:  This is a very nice 67 year old Caucasian female with a several year history of chronic nonproductive cough of unexplained etiology.  She denies any reflux symptoms, dysphagia, but apparently was seen by GI in 2001 and had an abnormal gastric emptying scan and was treated for gastroparesis.  However, these records are not available for review at this time.  Her chronic cough is nonproductive and seems to be helped only by periodic courses of steroids.  She is followed closely by Dr. Marcelyn Bruins in pulmonary, and she is on QVAR inhaler twice a day, when necessary Ventolin, and Prilosec 20 mg twice a day.  I cannot get any GI symptomatology from this patient, she has regular bowel movements without melena or hematochezia.  Colonoscopy was negative except for diverticulosis and 2012 by Dr. Jennell Corner [facility]  She has been treated in the past for breast cancer and has chronic anxiety syndrome.  Review of systems is fairly positive for multiple symptoms including aches and pains, itching, confusion, fatigue, urinary frequency and leakage et Karie Soda.  I have reviewed this patient's present history, medical and surgical past history, allergies and medications.     ROS:   All systems were reviewed and are negative unless otherwise stated in the HPI.  No Known Allergies Outpatient Prescriptions Prior to Visit  Medication Sig Dispense Refill  . atorvastatin (LIPITOR) 10 MG tablet Take 20 mg by mouth daily.      . beclomethasone (QVAR) 80 MCG/ACT inhaler Inhale 2 puffs into the lungs 2 (two) times daily.  1 Inhaler  6  . buPROPion (WELLBUTRIN XL) 150 MG 24 hr tablet Take 150 mg by mouth daily.        Marland Kitchen levothyroxine (SYNTHROID, LEVOTHROID) 112 MCG tablet Take 112 mcg by mouth daily before breakfast.      . losartan (COZAAR) 100 MG tablet Take 50 mg by mouth daily.        . Multiple Vitamin (MULITIVITAMIN WITH MINERALS) TABS Take 1 tablet by mouth daily.      .  Rivaroxaban (XARELTO) 20 MG TABS Take 1 tablet (20 mg total) by mouth daily with supper.  30 tablet  6  . traMADol (ULTRAM) 50 MG tablet Take 1 tablet (50 mg total) by mouth every 8 (eight) hours as needed (cough).  30 tablet  0  . omeprazole (PRILOSEC) 20 MG capsule Take 20 mg by mouth daily.        No facility-administered medications prior to visit.   Past Medical History  Diagnosis Date  . Rheumatic heart disease     mild AS/AI and mild MS/trivial MR; cardiac cath in 1993; chronic class II dyspnea on exertion  . Gastroesophageal reflux disease   . Hyperlipidemia   . Hypertension     pt denies 05/30/13  . Cardioembolic stroke 01/2012    Right frontal in 01/2012; normal carotid ultrasound; possible LAA thrombus by TEE; virtual complete neurologic recovery  . Fasting hyperglycemia     120 fasting  . Chronic kidney disease, stage 2, mildly decreased GFR     GFR of approximately 60  . Breast carcinoma 1995    1995  . Depression   . Diverticulosis of colon (without mention of hemorrhage) 2012    Dr. Karilyn Cota  . Gastroparesis   . Hemorrhoids   . Anemia    Past Surgical History  Procedure Laterality Date  . Mitral valve surgery  [facility], closed mitral valvulotomy by finger fracture  .  Cardiac catheterization    . Dilation and curettage of uterus    . Breast lumpectomy Right 1995  . Tubal ligation  1973  . Tee without cardioversion  01/24/2012    Procedure: TRANSESOPHAGEAL ECHOCARDIOGRAM (TEE);  Surgeon: Lewayne Bunting, MD;  Location: [facility] ENDOSCOPY;  Service: Cardiovascular;  Laterality: N/A;  . Colonoscopy  2012    Negative screening procedure   History   Social History  . Marital Status: Married    Spouse Name: N/A    Number of Children: 1  . Years of Education: N/A   Occupational History  . Retired Runner, broadcasting/film/video   .     Social History Main Topics  . Smoking status: Never Smoker   . Smokeless tobacco: Never Used  . Alcohol Use: No  . Drug Use: No  . Sexual  Activity: None   Other Topics Concern  . None   Social History Narrative   Married with children   No regular exercise   Family History  Problem Relation Age of Onset  . Heart disease Brother 50    MI  . Rheum arthritis Maternal Grandmother   . Asthma Maternal Grandfather   . Hypothyroidism Mother   . Diabetes Sister   . Cirrhosis Father   . Cervical cancer Sister     malignant neoplasm of cervix uteri       Physical Exam: Blood pressure 130/62, pulse 80 and regular and weight 171 with a BMI of 31.1.  Examination oral pharyngeal areas entirely normal. General well developed well nourished patient in no acute distress, appearing their stated age Eyes PERRLA, no icterus, fundoscopic exam per opthamologist Skin no lesions noted Neck supple, no adenopathy, no thyroid enlargement, no tenderness Chest clear to percussion and auscultation Heart no significant murmurs, gallops or rubs noted Abdomen no hepatosplenomegaly masses or tenderness, BS normal.  Extremities no acute joint lesions, edema, phlebitis or evidence of cellulitis. Neurologic patient oriented x 3, cranial nerves intact, no focal neurologic deficits noted. Psychological mental status normal and normal affect.  Assessment and plan: I doubt this patient's cough related to acid reflux with no response to twice a day  PPI therapy.  Response to prednisone suggest reactive airways disease.  She has no symptoms currently of gastroparesis whatsoever.  She's had no early satiety, nausea vomiting, weight loss etc.  I've empirically placed her on Dexilant 60 mg a day instead of Prilosec, reviewed reflux restriction with the patient and her husband, and schedule her for esophageal high-resolution manometry.  I've explained to her husband that coughing and other extra esophageal manifestations of GERD are hard to diagnose and to treat.  Do not think endoscopy this point will add anything to her treatment.

## 2013-05-30 NOTE — Patient Instructions (Signed)
Please stop Omeprazole and start Dexilant 60 mg, one capsule by mouth once daily. Prescription has been sent to your pharmacy   You have been scheduled for an esophageal manometry at [facility] on 06-17-2013 at 11 am. Please arrive 30 minutes prior to your procedure for registration. You will need to go to outpatient registration (1st floor of the hospital) first. Make certain to bring your insurance cards as well as a complete list of medications.  Please remember the following:  1) Nothing to eat or drink after 12:00 midnight on the night before your test.  2) Hold all diabetic medications/insulin the morning of the test. You may eat and take your medications after the test.  3) For 3 days prior to your test do not take: Dexilant, Prevacid, Nexium, Protonix, Aciphex, Zegerid, Pantoprazole, Prilosec or omeprazole.  4) For 2 days prior to your test, do not take: Reglan, Tagamet, Zantac, Axid or Pepcid.  5) You MAY use an antacid such as Rolaids or Tums up to 12 hours prior to your test.  It will take at least 2 weeks to receive the results of this test from your physician. ------------------------------------------ ABOUT ESOPHAGEAL MANOMETRY Esophageal manometry (muh-NOM-uh-tree) is a test that gauges how well your esophagus works. Your esophagus is the long, muscular tube that connects your throat to your stomach. Esophageal manometry measures the rhythmic muscle contractions (peristalsis) that occur in your esophagus when you swallow. Esophageal manometry also measures the coordination and force exerted by the muscles of your esophagus.  During esophageal manometry, a thin, flexible tube (catheter) that contains sensors is passed through your nose, down your esophagus and into your stomach. Esophageal manometry can be helpful in diagnosing some mostly uncommon disorders that affect your esophagus.  Why it's done Esophageal manometry is used to evaluate the movement (motility) of  food through the esophagus and into the stomach. The test measures how well the circular bands of muscle (sphincters) at the top and bottom of your esophagus open and close, as well as the pressure, strength and pattern of the wave of esophageal muscle contractions that moves food along.  What you can expect Esophageal manometry is an outpatient procedure done without sedation. Most people tolerate it well. You may be asked to change into a hospital gown before the test starts.  During esophageal manometry  While you are sitting up, a member of your health care team sprays your throat with a numbing medication or puts numbing gel in your nose or both.  A catheter is guided through your nose into your esophagus. The catheter may be sheathed in a water-filled sleeve. It doesn't interfere with your breathing. However, your eyes may water, and you may gag. You may have a slight nosebleed from irritation.  After the catheter is in place, you may be asked to lie on your back on an exam table, or you may be asked to remain seated.  You then swallow small sips of water. As you do, a computer connected to the catheter records the pressure, strength and pattern of your esophageal muscle contractions.  During the test, you'll be asked to breathe slowly and smoothly, remain as still as possible, and swallow only when you're asked to do so.  A member of your health care team may move the catheter down into your stomach while the catheter continues its measurements.  The catheter then is slowly withdrawn. The test usually lasts 20 to 30 minutes.  After esophageal manometry  When your esophageal  manometry is complete, you may return to your normal activities  This test typically takes 30-45 minutes to complete. ________________________________________________________________________________

## 2013-06-04 ENCOUNTER — Telehealth: Payer: Self-pay | Admitting: *Deleted

## 2013-06-04 NOTE — Telephone Encounter (Signed)
Received call from pt husband to note she has SOB and tightness in her chest, ongoing and getting worse since last Monday from SOB and the tightness also started last Monday, was treated for coughing since April per PCP and pulmonologist and ruled out lung involvement, noted pt was on coumadin and was switched to xarelto, noted BP has been "great" no numbers recorded, minimal swelling in ankles, pt notes this previously when her valve was getting worse, per current sxs advised for pt to be evaluated, pt quoted to husband "I will not go to the ER unless I am unconscious" this weekend when he tried to get her to go, I advised we do not have any available apt today and the pt will need to be seen today per current sxs and needs that can be met better in the ER, pt husband understood and will try to get pt to go to the ER

## 2013-06-04 NOTE — Telephone Encounter (Signed)
Noted pt did not go to the ED as advised, left a vm to check on pt update and to call office with any further concerns

## 2013-06-05 NOTE — Telephone Encounter (Signed)
Left pt another vm to advise no visit to [facility] noted to please call back to our office with any further concerns if necessary

## 2013-06-17 ENCOUNTER — Ambulatory Visit (HOSPITAL_COMMUNITY)
Admission: RE | Admit: 2013-06-17 | Discharge: 2013-06-17 | Disposition: A | Discharge status: Home / Self Care | Payer: Medicare Other | Source: Ambulatory Visit | Attending: Gastroenterology | Admitting: Gastroenterology

## 2013-06-17 ENCOUNTER — Encounter (HOSPITAL_COMMUNITY)
Admission: RE | Disposition: A | Discharge status: Home / Self Care | Payer: Self-pay | Source: Ambulatory Visit | Attending: Gastroenterology

## 2013-06-17 DIAGNOSIS — R059 Cough, unspecified: Secondary | ICD-10-CM | POA: Insufficient documentation

## 2013-06-17 DIAGNOSIS — R05 Cough: Secondary | ICD-10-CM | POA: Insufficient documentation

## 2013-06-17 DIAGNOSIS — R1310 Dysphagia, unspecified: Secondary | ICD-10-CM

## 2013-06-17 DIAGNOSIS — K219 Gastro-esophageal reflux disease without esophagitis: Secondary | ICD-10-CM | POA: Insufficient documentation

## 2013-06-17 HISTORY — PX: ESOPHAGEAL MANOMETRY: SHX5429

## 2013-06-17 SURGERY — MANOMETRY, ESOPHAGUS
Anesthesia: Topical

## 2013-06-17 MED ORDER — LIDOCAINE VISCOUS 2 % MT SOLN
OROMUCOSAL | Status: AC
  Filled 2013-06-17: qty 15

## 2013-06-17 SURGICAL SUPPLY — 4 items
DRAPE UTILITY 15X26 W/TAPE STR (DRAPE) ×2 IMPLANT
GLOVE BIOGEL PI IND STRL 8 (GLOVE) ×1 IMPLANT
GLOVE BIOGEL PI INDICATOR 8 (GLOVE) ×1
GOWN PREVENTION PLUS LG XLONG (DISPOSABLE) ×2 IMPLANT

## 2013-06-18 ENCOUNTER — Encounter (HOSPITAL_COMMUNITY): Payer: Self-pay | Admitting: Gastroenterology

## 2013-06-20 ENCOUNTER — Ambulatory Visit: Payer: Self-pay | Admitting: *Deleted

## 2013-06-20 DIAGNOSIS — Z7901 Long term (current) use of anticoagulants: Secondary | ICD-10-CM

## 2013-06-25 ENCOUNTER — Encounter: Payer: Self-pay | Admitting: Cardiovascular Disease

## 2013-06-27 ENCOUNTER — Ambulatory Visit: Payer: Medicare Other | Admitting: Cardiovascular Disease

## 2013-06-27 ENCOUNTER — Telehealth: Payer: Self-pay | Admitting: Pulmonary Disease

## 2013-06-27 ENCOUNTER — Other Ambulatory Visit: Payer: Self-pay | Admitting: *Deleted

## 2013-06-27 MED ORDER — BECLOMETHASONE DIPROPIONATE 80 MCG/ACT IN AERS: 2.0000 | INHALATION_SPRAY | Freq: Two times a day (BID) | RESPIRATORY_TRACT | Status: DC

## 2013-06-27 NOTE — Telephone Encounter (Signed)
Refill was for QVAR not Advair as originally stated in message.

## 2013-06-27 NOTE — Telephone Encounter (Signed)
beclomethasone (QVAR) 80 MCG/ACT inhaler 1 Inhaler 6 06/27/2013     Sig - Route: Inhale 2 puffs into the lungs 2 (two) times daily. - Inhalation    E-Prescribing Status: Receipt confirmed by pharmacy (06/27/2013 2:16 PM EDT)        Pt aware that rx refill has already been sent today. Nothing more needed at this time.

## 2013-07-02 ENCOUNTER — Telehealth: Payer: Self-pay | Admitting: Gastroenterology

## 2013-07-02 NOTE — Telephone Encounter (Signed)
Pt reports problems since her EM that was done on 06/17/13. Pt states since the procedure, she has a feeling of burning in her esophagus about 10 inches down; she describes the feeling like heartburn and a lot of pressure, lot of pressure in the middle of her chest. She is able to eat and drink, but limites foods and cautious eating. Pt was given an appt with Dr Jarold Motto in am.

## 2013-07-03 ENCOUNTER — Ambulatory Visit: Payer: Medicare Other | Admitting: Cardiovascular Disease

## 2013-07-03 ENCOUNTER — Ambulatory Visit (INDEPENDENT_AMBULATORY_CARE_PROVIDER_SITE_OTHER): Payer: Medicare Other | Admitting: Gastroenterology

## 2013-07-03 ENCOUNTER — Telehealth: Payer: Self-pay | Admitting: Gastroenterology

## 2013-07-03 ENCOUNTER — Encounter: Payer: Self-pay | Admitting: Gastroenterology

## 2013-07-03 VITALS — BP 110/58 | HR 85 | Ht 62.25 in | Wt 170.0 lb

## 2013-07-03 DIAGNOSIS — R079 Chest pain, unspecified: Secondary | ICD-10-CM

## 2013-07-03 DIAGNOSIS — R1319 Other dysphagia: Secondary | ICD-10-CM

## 2013-07-03 MED ORDER — HYOSCYAMINE SULFATE 0.125 MG SL SUBL: 0.1250 mg | SUBLINGUAL_TABLET | SUBLINGUAL | Status: DC | PRN

## 2013-07-03 MED ORDER — SUCRALFATE 1 GM/10ML PO SUSP: ORAL | Status: DC

## 2013-07-03 NOTE — Telephone Encounter (Signed)
Spoke with Tammy at pharmacy She wanted to verify Carafate RX is 10 ml

## 2013-07-03 NOTE — Progress Notes (Signed)
This is a located 67 year old Caucasian female with asthmatic bronchitis, rheumatic heart disease, hypertension, and is chronically anticoagulated on Xarelto per history of atrial fib.  I initially saw her because of a typical acid reflux symptoms and chronic coughing.  Esophageal manometry was completed on September 15 and showed some I. protonic esophageal contractions and a slightly increased lower esophageal sphincter residual pressure.  This exam was not formally been read at this point.  Patient relates that post procedure she's had discomfort in her substernal area with some dysphagia and painful swallowing.  She seemed previous gastroenterologist and had negative endoscopy several years ago.  She is on currently Dexilant 60 mg a day, and allegedly says this is" not as good his Prilosec.".  There is no history of known hepatobiliary or gallbladder disease.  She denies systemic complaints or any specific cardiovascular or pulmonary complaints.   Current Medications, Allergies, Past Medical History, Past Surgical History, Family History and Social History were reviewed in [employer] record.  ROS: All systems were reviewed and are negative unless otherwise stated in the HPI.          Physical Exam: Healthy and nontoxic appearing female in no acute distress appears stated age.  Blood pressure 110/58, pulse 85, weight 170.  I cannot appreciate stigmata of chronic liver disease.  Her chest is clear suffer scattered wheezes in both lung fields.  She appear to be in a fairly regular rhythm without murmurs gallops or rubs that I can detect.  Her abdomen shows no organomegaly, masses or tenderness.  Bowel sounds are normal.  Mental status is normal.    Assessment and Plan: This patient feels that she had some type of trauma related to her esophageal manometry which was done 2 weeks ago.  This would be a most unusual occurrence and situation. Quick  review her manometry  today it  appears that she might have a variation esophageal spasm.  I have asked her continue her Dexilant, use Carafate suspension 1 tablespoon every 4 hours with when necessary sublingual Levsin, and we will proceed with barium swallow exam.  She may need followup endoscopic exam depending on her x-ray results and clinical course.  Otherwise continue medications as listed and reviewed.

## 2013-07-03 NOTE — Patient Instructions (Signed)
You have been scheduled for a Barium Esophogram at [facility] (1st floor of the hospital) on 07-05-2013 at 1030 am. Please arrive 15 minutes prior to your appointment for registration. Make certain not to have anything to eat or drink 6 hours prior to your test. If you need to reschedule for any reason, please contact radiology at [phone] to do so. __________________________________________________________________ A barium swallow is an examination that concentrates on views of the esophagus. This tends to be a double contrast exam (barium and two liquids which, when combined, create a gas to distend the wall of the oesophagus) or single contrast (non-ionic iodine based). The study is usually tailored to your symptoms so a good history is essential. Attention is paid during the study to the form, structure and configuration of the esophagus, looking for functional disorders (such as aspiration, dysphagia, achalasia, motility and reflux) EXAMINATION You may be asked to change into a gown, depending on the type of swallow being performed. A radiologist and radiographer will perform the procedure. The radiologist will advise you of the type of contrast selected for your procedure and direct you during the exam. You will be asked to stand, sit or lie in several different positions and to hold a small amount of fluid in your mouth before being asked to swallow while the imaging is performed .In some instances you may be asked to swallow barium coated marshmallows to assess the motility of a solid food bolus. The exam can be recorded as a digital or video fluoroscopy procedure. POST PROCEDURE It will take 1-2 days for the barium to pass through your system. To facilitate this, it is important, unless otherwise directed, to increase your fluids for the next 24-48hrs and to resume your normal diet.  This test typically takes about 30 minutes to  perform. __________________________________________________________________________________  We have sent the following medications to your pharmacy for you to pick up at your convenience: Levsin, please take as needed for abdominal cramping Carafate, please take every four hours

## 2013-07-05 ENCOUNTER — Ambulatory Visit (HOSPITAL_COMMUNITY)
Admission: RE | Admit: 2013-07-05 | Discharge: 2013-07-05 | Disposition: A | Discharge status: Home / Self Care | Payer: Medicare Other | Source: Ambulatory Visit | Attending: Gastroenterology | Admitting: Gastroenterology

## 2013-07-05 DIAGNOSIS — R1319 Other dysphagia: Secondary | ICD-10-CM

## 2013-07-05 DIAGNOSIS — R1310 Dysphagia, unspecified: Secondary | ICD-10-CM | POA: Insufficient documentation

## 2013-07-05 DIAGNOSIS — R059 Cough, unspecified: Secondary | ICD-10-CM | POA: Insufficient documentation

## 2013-07-05 DIAGNOSIS — R079 Chest pain, unspecified: Secondary | ICD-10-CM | POA: Insufficient documentation

## 2013-07-05 DIAGNOSIS — R05 Cough: Secondary | ICD-10-CM | POA: Insufficient documentation

## 2013-07-06 ENCOUNTER — Encounter (HOSPITAL_COMMUNITY): Payer: Self-pay

## 2013-07-06 ENCOUNTER — Inpatient Hospital Stay (HOSPITAL_COMMUNITY)
Admission: EM | Admit: 2013-07-06 | Discharge: 2013-07-12 | DRG: 189 | Disposition: A | Discharge status: Home / Self Care | Payer: Medicare Other | Attending: Cardiology | Admitting: Cardiology

## 2013-07-06 DIAGNOSIS — Z79899 Other long term (current) drug therapy: Secondary | ICD-10-CM

## 2013-07-06 DIAGNOSIS — J90 Pleural effusion, not elsewhere classified: Secondary | ICD-10-CM

## 2013-07-06 DIAGNOSIS — I059 Rheumatic mitral valve disease, unspecified: Secondary | ICD-10-CM

## 2013-07-06 DIAGNOSIS — K3184 Gastroparesis: Secondary | ICD-10-CM | POA: Diagnosis present

## 2013-07-06 DIAGNOSIS — R0902 Hypoxemia: Secondary | ICD-10-CM

## 2013-07-06 DIAGNOSIS — E039 Hypothyroidism, unspecified: Secondary | ICD-10-CM | POA: Diagnosis present

## 2013-07-06 DIAGNOSIS — K649 Unspecified hemorrhoids: Secondary | ICD-10-CM | POA: Diagnosis present

## 2013-07-06 DIAGNOSIS — I050 Rheumatic mitral stenosis: Secondary | ICD-10-CM

## 2013-07-06 DIAGNOSIS — I639 Cerebral infarction, unspecified: Secondary | ICD-10-CM | POA: Diagnosis present

## 2013-07-06 DIAGNOSIS — D696 Thrombocytopenia, unspecified: Secondary | ICD-10-CM | POA: Diagnosis present

## 2013-07-06 DIAGNOSIS — R079 Chest pain, unspecified: Secondary | ICD-10-CM

## 2013-07-06 DIAGNOSIS — R0602 Shortness of breath: Secondary | ICD-10-CM

## 2013-07-06 DIAGNOSIS — J9601 Acute respiratory failure with hypoxia: Secondary | ICD-10-CM

## 2013-07-06 DIAGNOSIS — I359 Nonrheumatic aortic valve disorder, unspecified: Secondary | ICD-10-CM

## 2013-07-06 DIAGNOSIS — R05 Cough: Secondary | ICD-10-CM | POA: Diagnosis present

## 2013-07-06 DIAGNOSIS — Z8673 Personal history of transient ischemic attack (TIA), and cerebral infarction without residual deficits: Secondary | ICD-10-CM

## 2013-07-06 DIAGNOSIS — R7301 Impaired fasting glucose: Secondary | ICD-10-CM | POA: Diagnosis present

## 2013-07-06 DIAGNOSIS — I351 Nonrheumatic aortic (valve) insufficiency: Secondary | ICD-10-CM

## 2013-07-06 DIAGNOSIS — Z853 Personal history of malignant neoplasm of breast: Secondary | ICD-10-CM

## 2013-07-06 DIAGNOSIS — E876 Hypokalemia: Secondary | ICD-10-CM | POA: Diagnosis not present

## 2013-07-06 DIAGNOSIS — F329 Major depressive disorder, single episode, unspecified: Secondary | ICD-10-CM | POA: Diagnosis present

## 2013-07-06 DIAGNOSIS — F3289 Other specified depressive episodes: Secondary | ICD-10-CM | POA: Diagnosis present

## 2013-07-06 DIAGNOSIS — I350 Nonrheumatic aortic (valve) stenosis: Secondary | ICD-10-CM

## 2013-07-06 DIAGNOSIS — R059 Cough, unspecified: Secondary | ICD-10-CM | POA: Diagnosis present

## 2013-07-06 DIAGNOSIS — I129 Hypertensive chronic kidney disease with stage 1 through stage 4 chronic kidney disease, or unspecified chronic kidney disease: Secondary | ICD-10-CM | POA: Diagnosis present

## 2013-07-06 DIAGNOSIS — N182 Chronic kidney disease, stage 2 (mild): Secondary | ICD-10-CM | POA: Diagnosis present

## 2013-07-06 DIAGNOSIS — E785 Hyperlipidemia, unspecified: Secondary | ICD-10-CM | POA: Diagnosis present

## 2013-07-06 DIAGNOSIS — J9600 Acute respiratory failure, unspecified whether with hypoxia or hypercapnia: Principal | ICD-10-CM | POA: Diagnosis present

## 2013-07-06 DIAGNOSIS — I099 Rheumatic heart disease, unspecified: Secondary | ICD-10-CM | POA: Diagnosis present

## 2013-07-06 DIAGNOSIS — E782 Mixed hyperlipidemia: Secondary | ICD-10-CM | POA: Diagnosis present

## 2013-07-06 DIAGNOSIS — K5730 Diverticulosis of large intestine without perforation or abscess without bleeding: Secondary | ICD-10-CM | POA: Diagnosis present

## 2013-07-06 DIAGNOSIS — Z7901 Long term (current) use of anticoagulants: Secondary | ICD-10-CM

## 2013-07-06 DIAGNOSIS — J810 Acute pulmonary edema: Secondary | ICD-10-CM

## 2013-07-06 DIAGNOSIS — I10 Essential (primary) hypertension: Secondary | ICD-10-CM | POA: Diagnosis present

## 2013-07-06 DIAGNOSIS — I080 Rheumatic disorders of both mitral and aortic valves: Secondary | ICD-10-CM | POA: Diagnosis present

## 2013-07-06 DIAGNOSIS — K219 Gastro-esophageal reflux disease without esophagitis: Secondary | ICD-10-CM | POA: Diagnosis present

## 2013-07-06 NOTE — ED Notes (Signed)
Dr. Saul Fordyce in room assessing patient at this time.

## 2013-07-06 NOTE — ED Notes (Signed)
Having a hard time breathing and some chest pain. I have had it off and on for a while and it got worse after having the test yesterday at [facility]. Had a barium test of my esophagus per pt.

## 2013-07-06 NOTE — ED Provider Notes (Addendum)
CSN: [id number]     Arrival date & time 07/06/13  2326 History  This chart was scribed for Shelda Jakes, MD by Ardelia Mems, ED Scribe. This patient was seen in room APA02/APA02 and the patient's care was started at 11:40 PM.    Chief Complaint  Patient presents with  . Shortness of Breath  . Chest Pain    Patient is a 67 y.o. female presenting with chest pain. The history is provided by the patient. No language interpreter was used.  Chest Pain Pain location:  Epigastric (central) Pain radiates to:  Does not radiate Pain radiates to the back: no   Pain severity:  Moderate Onset quality:  Gradual Duration:  2 weeks (worsened 4.5 hours ago) Timing:  Intermittent Progression:  Worsening Chronicity:  Recurrent Context comment:  Hx of GERD Relieved by:  Nothing Worsened by:  Certain positions Ineffective treatments:  None tried Associated symptoms: cough and shortness of breath   Associated symptoms: no abdominal pain, no back pain, no fever, no nausea and not vomiting     HPI Comments: [name] is a 67 y.o. female with a history of GERD and stroke who presents to the Emergency Department complaining intermittent, central, non-radiating chest pain over the past 2 weeks which worsened about 4.5 hours ago. She states that she has been having episodes of this pain lasting 1 to 1.5 hours. She reports associated gradually worsening SOB onset yesterday, which concerned her enough to come to the ED tonight. She states that her SOB is worsened with even mild exertion. She states that her SOB has improved some since arriving to the ED. She also reports associated diaphoresis en route to the ED. She denies associated nausea or emesis. She states that she has a history of GERD, and that her current pain feels similar to pain which she has had with GERD in the past. She states that she has recently seen Dr. Jarold Motto from GI, and had a manometer test and Esophagram/Barium Swallow study  test in the past 2 weeks. Husband also states that several months ago, pt developed a persistent cough and was referred to a pulmonologist. She states that she was placed on an inhaler, and she states that since her cough did not go away, she was told that she may have GERD. She denies hx of MI, stent placement or recent cardiac cath placements. She states that she has some intermittent lower leg swelling at baseline. She states that she takes Xarelto daily.  PCP- Dr. Carylon Perches   Past Medical History  Diagnosis Date  . Rheumatic heart disease     mild AS/AI and mild MS/trivial MR; cardiac cath in 1993; chronic class II dyspnea on exertion  . Gastroesophageal reflux disease   . Hyperlipidemia   . Hypertension     pt denies 05/30/13  . Cardioembolic stroke 01/2012    Right frontal in 01/2012; normal carotid ultrasound; possible LAA thrombus by TEE; virtual complete neurologic recovery  . Fasting hyperglycemia     120 fasting  . Chronic kidney disease, stage 2, mildly decreased GFR     GFR of approximately 60  . Breast carcinoma 1995    1995  . Depression   . Diverticulosis of colon (without mention of hemorrhage) 2012    Dr. Karilyn Cota  . Gastroparesis   . Hemorrhoids   . Anemia    Past Surgical History  Procedure Laterality Date  . Mitral valve surgery  [facility], closed  mitral valvulotomy by finger fracture  . Cardiac catheterization    . Dilation and curettage of uterus    . Breast lumpectomy Right 1995  . Tubal ligation  1973  . Tee without cardioversion  01/24/2012    Procedure: TRANSESOPHAGEAL ECHOCARDIOGRAM (TEE);  Surgeon: Lewayne Bunting, MD;  Location: [facility] ENDOSCOPY;  Service: Cardiovascular;  Laterality: N/A;  . Colonoscopy  2012    Negative screening procedure  . Esophageal manometry N/A 06/17/2013    Procedure: ESOPHAGEAL MANOMETRY (EM);  Surgeon: Mardella Layman, MD;  Location: [facility] ENDOSCOPY;  Service: Endoscopy;  Laterality: N/A;   Family History  Problem  Relation Age of Onset  . Heart disease Brother 50    MI  . Rheum arthritis Maternal Grandmother   . Asthma Maternal Grandfather   . Hypothyroidism Mother   . Diabetes Sister   . Cirrhosis Father   . Cervical cancer Sister     malignant neoplasm of cervix uteri   History  Substance Use Topics  . Smoking status: Never Smoker   . Smokeless tobacco: Never Used  . Alcohol Use: No   OB History   Grav Para Term Preterm Abortions TAB SAB Ect Mult Living                 Review of Systems  Constitutional: Negative for fever and chills.  HENT: Negative for congestion and rhinorrhea.   Eyes: Negative for visual disturbance.  Respiratory: Positive for cough and shortness of breath.   Cardiovascular: Positive for chest pain. Negative for leg swelling.  Gastrointestinal: Negative for nausea, vomiting, abdominal pain and diarrhea.  Genitourinary: Negative for dysuria.  Musculoskeletal: Negative for back pain.  Skin: Negative for rash.  Hematological: Bruises/bleeds easily.  Psychiatric/Behavioral: Negative for confusion.  All other systems reviewed and are negative.   Allergies  Review of patient's allergies indicates no known allergies.  Home Medications   Current Outpatient Rx  Name  Route  Sig  Dispense  Refill  . albuterol (VENTOLIN HFA) 108 (90 BASE) MCG/ACT inhaler   Inhalation   Inhale 2 puffs into the lungs every 4 (four) hours as needed for wheezing.         Marland Kitchen atorvastatin (LIPITOR) 10 MG tablet   Oral   Take 20 mg by mouth daily.         . beclomethasone (QVAR) 80 MCG/ACT inhaler   Inhalation   Inhale 2 puffs into the lungs 2 (two) times daily.   1 Inhaler   6   . buPROPion (WELLBUTRIN XL) 150 MG 24 hr tablet   Oral   Take 150 mg by mouth daily.           Marland Kitchen dexlansoprazole (DEXILANT) 60 MG capsule   Oral   Take 1 capsule (60 mg total) by mouth daily.   90 capsule   3   . hyoscyamine (LEVSIN SL) 0.125 MG SL tablet   Sublingual   Place 1 tablet  (0.125 mg total) under the tongue every 4 (four) hours as needed for cramping.   30 tablet   0   . levothyroxine (SYNTHROID, LEVOTHROID) 112 MCG tablet   Oral   Take 112 mcg by mouth daily before breakfast.         . losartan (COZAAR) 100 MG tablet   Oral   Take 50 mg by mouth daily.           . Multiple Vitamin (MULITIVITAMIN WITH MINERALS) TABS   Oral   Take 1 tablet  by mouth daily.         . Rivaroxaban (XARELTO) 20 MG TABS   Oral   Take 1 tablet (20 mg total) by mouth daily with supper.   30 tablet   6   . sucralfate (CARAFATE) 1 GM/10ML suspension      Can take every 4 hours as needed   420 mL   2    BP 172/78  Pulse 100  Resp 19  SpO2 100%  Physical Exam  Nursing note and vitals reviewed. Constitutional: She is oriented to person, place, and time. She appears well-developed and well-nourished. No distress.  HENT:  Head: Normocephalic and atraumatic.  Eyes: EOM are normal.  Neck: Neck supple. No tracheal deviation present.  Cardiovascular: Normal rate and regular rhythm.   No murmur heard. Borderline tachycardic.  Pulmonary/Chest: Effort normal and breath sounds normal. No respiratory distress. She has no wheezes.  Lungs are clear bilaterally, but feels tight, like air is not moving well.  Musculoskeletal: Normal range of motion. She exhibits no tenderness.  No pitting edema in bilateral lower legs.  Neurological: She is alert and oriented to person, place, and time.  Skin: Skin is warm and dry.  Psychiatric: She has a normal mood and affect. Her behavior is normal.    ED Course  Procedures (including critical care time)  DIAGNOSTIC STUDIES: Oxygen Saturation is 94% on RA, adequate by my interpretation.    COORDINATION OF CARE: 11:47 PM- Discussed plan to obtain an EKG. Pt advised of plan for treatment and pt agrees.  Labs Review Labs Reviewed  CBC WITH DIFFERENTIAL - Abnormal; Notable for the following:    Platelets 84 (*)    All other  components within normal limits  COMPREHENSIVE METABOLIC PANEL - Abnormal; Notable for the following:    Glucose, Bld 158 (*)    GFR calc non Af Amer 61 (*)    GFR calc Af Amer 70 (*)    All other components within normal limits  D-DIMER, QUANTITATIVE - Abnormal; Notable for the following:    D-Dimer, Quant 2.50 (*)    All other components within normal limits  PRO B NATRIURETIC PEPTIDE - Abnormal; Notable for the following:    Pro B Natriuretic peptide (BNP) 661.1 (*)    All other components within normal limits  PROTIME-INR - Abnormal; Notable for the following:    Prothrombin Time 18.9 (*)    INR 1.63 (*)    All other components within normal limits  TROPONIN I  TROPONIN I   Results for orders placed during the hospital encounter of 07/06/13  CBC WITH DIFFERENTIAL      Result Value Range   WBC 8.5  4.0 - 10.5 K/uL   RBC 4.62  3.87 - 5.11 MIL/uL   Hemoglobin 13.5  12.0 - 15.0 g/dL   HCT 16.1  09.6 - 04.5 %   MCV 89.8  78.0 - 100.0 fL   MCH 29.2  26.0 - 34.0 pg   MCHC 32.5  30.0 - 36.0 g/dL   RDW 40.9  81.1 - 91.4 %   Platelets 84 (*) 150 - 400 K/uL   Neutrophils Relative % 71  43 - 77 %   Neutro Abs 6.0  1.7 - 7.7 K/uL   Lymphocytes Relative 18  12 - 46 %   Lymphs Abs 1.5  0.7 - 4.0 K/uL   Monocytes Relative 6  3 - 12 %   Monocytes Absolute 0.5  0.1 - 1.0 K/uL  Eosinophils Relative 5  0 - 5 %   Eosinophils Absolute 0.4  0.0 - 0.7 K/uL   Basophils Relative 1  0 - 1 %   Basophils Absolute 0.0  0.0 - 0.1 K/uL   Smear Review SPECIMEN CHECKED FOR CLOTS    COMPREHENSIVE METABOLIC PANEL      Result Value Range   Sodium 135  135 - 145 mEq/L   Potassium 3.5  3.5 - 5.1 mEq/L   Chloride 99  96 - 112 mEq/L   CO2 24  19 - 32 mEq/L   Glucose, Bld 158 (*) 70 - 99 mg/dL   BUN 11  6 - 23 mg/dL   Creatinine, Ser 1.61  0.50 - 1.10 mg/dL   Calcium 9.5  8.4 - 09.6 mg/dL   Total Protein 7.4  6.0 - 8.3 g/dL   Albumin 3.6  3.5 - 5.2 g/dL   AST 26  0 - 37 U/L   ALT 25  0 - 35 U/L    Alkaline Phosphatase 77  39 - 117 U/L   Total Bilirubin 0.4  0.3 - 1.2 mg/dL   GFR calc non Af Amer 61 (*) >90 mL/min   GFR calc Af Amer 70 (*) >90 mL/min  TROPONIN I      Result Value Range   Troponin I <0.30  <0.30 ng/mL  D-DIMER, QUANTITATIVE      Result Value Range   D-Dimer, Quant 2.50 (*) 0.00 - 0.48 ug/mL-FEU  PRO B NATRIURETIC PEPTIDE      Result Value Range   Pro B Natriuretic peptide (BNP) 661.1 (*) 0 - 125 pg/mL  PROTIME-INR      Result Value Range   Prothrombin Time 18.9 (*) 11.6 - 15.2 seconds   INR 1.63 (*) 0.00 - 1.49    Imaging Review Dg Chest 2 View  07/07/2013   CLINICAL DATA:  Shortness of breath and chest pain  EXAM: CHEST  2 VIEW  COMPARISON:  09/17/2012  FINDINGS: Moderate enlargement of the cardiomediastinal silhouette is noted. Center vascular congestion is present with a few Kerley B-lines peripherally and trace effusions. Lung volumes are low, with elevation of the right hemidiaphragm and probable superimposed right greater than left lower lobe atelectasis. Right axillary clips are noted with evidence of right lumpectomy.  IMPRESSION: Moderate cardiomegaly with findings suggestive of interstitial pulmonary edema. Followup after diuresis may be helpful if there is strong clinical suspicion for pneumonia which could be masked.   Electronically Signed   By: Christiana Pellant M.D.   On: 07/07/2013 00:59   Ct Angio Chest Pe W/cm &/or Wo Cm  07/07/2013   *RADIOLOGY REPORT*  Clinical Data: Shortness of breath and chest pain; back pain.  CT ANGIOGRAPHY CHEST  Technique:  Multidetector CT imaging of the chest using the standard protocol during bolus administration of intravenous contrast. Multiplanar reconstructed images including MIPs were obtained and reviewed to evaluate the vascular anatomy.  Contrast: OMNIPAQUE IOHEXOL 350 MG/ML SOLN  Comparison: Chest radiograph performed in the today at 12:22 a.m., and CT of the chest performed 02/19/2009  Findings: There is no  evidence of pulmonary embolus.  A moderate right-sided pleural effusion is noted, and a small loculated left-sided pleural effusion is seen.  There is diffuse interstitial prominence, likely reflecting mild pulmonary edema. There is no evidence of pneumothorax.  No masses are identified; no abnormal focal contrast enhancement is seen.  The mediastinum is unremarkable in appearance.  No mediastinal lymphadenopathy is seen.  Visualized mediastinal  nodes remain normal in size.  No pericardial effusion is identified.  The great vessels are grossly unremarkable in appearance.  No axillary lymphadenopathy is seen.  The visualized portions of the thyroid gland are unremarkable in appearance.  The visualized portions of the liver and spleen are unremarkable.  No acute osseous abnormalities are seen.  IMPRESSION:  1.  No evidence of pulmonary embolus. 2.  Moderate right-sided pleural effusion and small loculated left- sided pleural effusion; diffuse interstitial prominence likely reflects pulmonary edema.   Original Report Authenticated By: Tonia Ghent, M.D.   Dg Esophagus  07/05/2013   CLINICAL DATA:  Chest pain. Cough. Dysphagia. Recent endoscopy.  EXAM: ESOPHOGRAM / BARIUM SWALLOW / BARIUM TABLET STUDY  TECHNIQUE: Combined double contrast and single contrast examination performed using effervescent crystals, thick barium liquid, and thin barium liquid. The patient was observed with fluoroscopy swallowing a 13mm barium sulphate tablet.  COMPARISON:  None.  FLUOROSCOPY TIME:  1 min 28 seconds  FINDINGS: No evidence of esophageal mass or stricture. No radiographic signs of esophagitis. No evidence of hiatal hernia.  Esophageal motility is within normal limits. No gastroesophageal reflux seen during the exam. A 13 mm barium tablet passed freely through the esophagus and into the stomach.  IMPRESSION: Normal study. No radiographic abnormality of the esophagus identified.   Electronically Signed   By: Myles Rosenthal M.D.    On: 07/05/2013 11:58    Date: 07/07/2013  Rate: 108  Rhythm: sinus tachycardia  QRS Axis: normal  Intervals: normal  ST/T Wave abnormalities: nonspecific ST/T changes  Conduction Disutrbances:none  Narrative Interpretation:   Old EKG Reviewed: unchanged No snig. change in EKG compared to 01/25/2012   MDM   1. Shortness of breath   2. Hypoxia   3. Pleural effusion    Patient with significant worsening of shortness of breath this evening associated with some substernal chest pain. Workup in the emergency department including CT angios without significant findings. No evidence of pulmonary embolism. There is on CT bilateral pleural effusion and some question of pulmonary edema. Patient's initial troponin was negative EKG without acute cardiac changes. Patient's electrolytes normal. Patient has a repeat troponin pending. We got patient up to go to the bathroom and took her oxygen off she desats down to 84%. Patient does have a history of asthma however there has been no wheezing here with listening to her multiple times. Albuterol Atrovent nebulizer given. Patient will require admission for the hypoxia. I feel that this may be due to to the subclinical pulmonary edema. Doubt that there's been an acute cardiac event the repeat troponin will help sort that out. Patient at rest and on oxygen is in no acute distress. Patient's had long-standing complaint of chest discomfort normally lower and some breathing problems extensive workup by GI in pulmonary medicine have not found specific causes. Patient's primary care physician is Dr. Ouida Sills admission will be to his service via the hospitalist.  Second troponin is negative. No consistent with acute cardiac event. As stated patient will require admission for the hypoxia. This may be due to subclinical pulmonary edema patient treated with Lasix. No significant hypertension here.   In addition patient did have rheumatic heart disease as a child patient  relates that she had mitral valve injury and repair as a teenager. Possible symptoms could be related to the valve not working as well here of late. Consultation with cardiology for an echocardiogram could be helpful.  I personally performed the services described in this documentation,  which was scribed in my presence. The recorded information has been reviewed and is accurate.     Shelda Jakes, MD 07/07/13 [id number]  Shelda Jakes, MD 07/07/13 [phone]

## 2013-07-07 ENCOUNTER — Emergency Department (HOSPITAL_COMMUNITY): Payer: Medicare Other

## 2013-07-07 DIAGNOSIS — J810 Acute pulmonary edema: Secondary | ICD-10-CM

## 2013-07-07 DIAGNOSIS — R079 Chest pain, unspecified: Secondary | ICD-10-CM

## 2013-07-07 DIAGNOSIS — J9600 Acute respiratory failure, unspecified whether with hypoxia or hypercapnia: Principal | ICD-10-CM

## 2013-07-07 DIAGNOSIS — J90 Pleural effusion, not elsewhere classified: Secondary | ICD-10-CM

## 2013-07-07 DIAGNOSIS — J9601 Acute respiratory failure with hypoxia: Secondary | ICD-10-CM

## 2013-07-07 LAB — CBC WITH DIFFERENTIAL/PLATELET
Basophils Relative: 1 % (ref 0–1)
Eosinophils Absolute: 0.4 10*3/uL (ref 0.0–0.7)
HCT: 41.5 % (ref 36.0–46.0)
Hemoglobin: 13.5 g/dL (ref 12.0–15.0)
MCH: 29.2 pg (ref 26.0–34.0)
MCHC: 32.5 g/dL (ref 30.0–36.0)
Monocytes Absolute: 0.5 10*3/uL (ref 0.1–1.0)
Monocytes Relative: 6 % (ref 3–12)
Neutrophils Relative %: 71 % (ref 43–77)
Platelets: 84 10*3/uL — ABNORMAL LOW (ref 150–400)

## 2013-07-07 LAB — BLOOD GAS, ARTERIAL
Acid-Base Excess: 1.1 mmol/L (ref 0.0–2.0)
Bicarbonate: 24.5 mEq/L — ABNORMAL HIGH (ref 20.0–24.0)
O2 Content: 2 L/min
O2 Saturation: 97.1 %
pCO2 arterial: 35 mmHg (ref 35.0–45.0)
pO2, Arterial: 90.5 mmHg (ref 80.0–100.0)

## 2013-07-07 LAB — PROTIME-INR
INR: 1.63 — ABNORMAL HIGH (ref 0.00–1.49)
Prothrombin Time: 18.9 seconds — ABNORMAL HIGH (ref 11.6–15.2)

## 2013-07-07 LAB — COMPREHENSIVE METABOLIC PANEL
ALT: 25 U/L (ref 0–35)
Albumin: 3.6 g/dL (ref 3.5–5.2)
Alkaline Phosphatase: 77 U/L (ref 39–117)
BUN: 11 mg/dL (ref 6–23)
Calcium: 9.5 mg/dL (ref 8.4–10.5)
GFR calc Af Amer: 70 mL/min — ABNORMAL LOW (ref >90)
GFR calc non Af Amer: 61 mL/min — ABNORMAL LOW (ref >90)
Glucose, Bld: 158 mg/dL — ABNORMAL HIGH (ref 70–99)
Sodium: 135 mEq/L (ref 135–145)
Total Protein: 7.4 g/dL (ref 6.0–8.3)

## 2013-07-07 LAB — TROPONIN I
Troponin I: <0.3 ng/mL (ref <0.30)
Troponin I: <0.3 ng/mL (ref <0.30)

## 2013-07-07 LAB — PRO B NATRIURETIC PEPTIDE: Pro B Natriuretic peptide (BNP): 661.1 pg/mL — ABNORMAL HIGH (ref 0–125)

## 2013-07-07 MED ORDER — ATORVASTATIN CALCIUM 20 MG PO TABS
20.0000 mg | ORAL_TABLET | Freq: Every day | ORAL | Status: DC
  Administered 2013-07-07 – 2013-07-12 (×6): 20 mg via ORAL
  Filled 2013-07-07 (×6): qty 1

## 2013-07-07 MED ORDER — SODIUM CHLORIDE 0.9 % IV SOLN
INTRAVENOUS | Status: DC
  Administered 2013-07-07: 01:00:00 via INTRAVENOUS

## 2013-07-07 MED ORDER — PANTOPRAZOLE SODIUM 40 MG PO TBEC
40.0000 mg | DELAYED_RELEASE_TABLET | Freq: Every day | ORAL | Status: DC
  Administered 2013-07-07 – 2013-07-12 (×6): 40 mg via ORAL
  Filled 2013-07-07 (×6): qty 1

## 2013-07-07 MED ORDER — BUPROPION HCL ER (XL) 150 MG PO TB24
150.0000 mg | ORAL_TABLET | Freq: Every day | ORAL | Status: DC
  Administered 2013-07-07 – 2013-07-12 (×6): 150 mg via ORAL
  Filled 2013-07-07 (×7): qty 1

## 2013-07-07 MED ORDER — FUROSEMIDE 10 MG/ML IJ SOLN
40.0000 mg | Freq: Once | INTRAMUSCULAR | Status: AC
  Administered 2013-07-07: 40 mg via INTRAVENOUS
  Filled 2013-07-07: qty 4

## 2013-07-07 MED ORDER — ALBUTEROL SULFATE (5 MG/ML) 0.5% IN NEBU
2.5000 mg | INHALATION_SOLUTION | Freq: Once | RESPIRATORY_TRACT | Status: AC
  Administered 2013-07-07: 2.5 mg via RESPIRATORY_TRACT
  Filled 2013-07-07: qty 0.5

## 2013-07-07 MED ORDER — SODIUM CHLORIDE 0.9 % IJ SOLN
3.0000 mL | Freq: Two times a day (BID) | INTRAMUSCULAR | Status: DC
  Administered 2013-07-07 – 2013-07-11 (×5): 3 mL via INTRAVENOUS

## 2013-07-07 MED ORDER — ONDANSETRON HCL 4 MG/2ML IJ SOLN: 4.0000 mg | Freq: Four times a day (QID) | INTRAMUSCULAR | Status: DC | PRN

## 2013-07-07 MED ORDER — ACETAMINOPHEN 325 MG PO TABS: 650.0000 mg | ORAL_TABLET | ORAL | Status: DC | PRN

## 2013-07-07 MED ORDER — GUAIFENESIN-DM 100-10 MG/5ML PO SYRP
10.0000 mL | ORAL_SOLUTION | ORAL | Status: DC | PRN
  Administered 2013-07-07 – 2013-07-09 (×4): 10 mL via ORAL
  Filled 2013-07-07 (×4): qty 10

## 2013-07-07 MED ORDER — IPRATROPIUM BROMIDE 0.02 % IN SOLN
0.5000 mg | Freq: Once | RESPIRATORY_TRACT | Status: AC
  Administered 2013-07-07: 0.5 mg via RESPIRATORY_TRACT
  Filled 2013-07-07: qty 2.5

## 2013-07-07 MED ORDER — SUCRALFATE 1 GM/10ML PO SUSP
1.0000 g | Freq: Three times a day (TID) | ORAL | Status: DC
  Administered 2013-07-07 – 2013-07-12 (×15): 1 g via ORAL
  Filled 2013-07-07 (×23): qty 10

## 2013-07-07 MED ORDER — ALBUTEROL SULFATE (5 MG/ML) 0.5% IN NEBU
2.5000 mg | INHALATION_SOLUTION | RESPIRATORY_TRACT | Status: DC | PRN
  Administered 2013-07-07 (×2): 2.5 mg via RESPIRATORY_TRACT
  Filled 2013-07-07 (×3): qty 0.5

## 2013-07-07 MED ORDER — HYOSCYAMINE SULFATE 0.125 MG SL SUBL
0.1250 mg | SUBLINGUAL_TABLET | SUBLINGUAL | Status: DC | PRN
  Filled 2013-07-07: qty 1

## 2013-07-07 MED ORDER — RIVAROXABAN 10 MG PO TABS
20.0000 mg | ORAL_TABLET | Freq: Every day | ORAL | Status: DC
  Administered 2013-07-07: 20 mg via ORAL
  Filled 2013-07-07: qty 1
  Filled 2013-07-07 (×2): qty 2

## 2013-07-07 MED ORDER — FLUTICASONE PROPIONATE HFA 44 MCG/ACT IN AERO
1.0000 | INHALATION_SPRAY | Freq: Two times a day (BID) | RESPIRATORY_TRACT | Status: DC
  Administered 2013-07-07 – 2013-07-12 (×11): 1 via RESPIRATORY_TRACT
  Filled 2013-07-07 (×3): qty 10.6

## 2013-07-07 MED ORDER — IOHEXOL 350 MG/ML SOLN
100.0000 mL | Freq: Once | INTRAVENOUS | Status: AC | PRN
  Administered 2013-07-07: 100 mL via INTRAVENOUS

## 2013-07-07 MED ORDER — LEVOTHYROXINE SODIUM 112 MCG PO TABS
112.0000 ug | ORAL_TABLET | Freq: Every day | ORAL | Status: DC
  Administered 2013-07-08 – 2013-07-12 (×5): 112 ug via ORAL
  Filled 2013-07-07 (×9): qty 1

## 2013-07-07 MED ORDER — FUROSEMIDE 10 MG/ML IJ SOLN: 20.0000 mg | Freq: Once | INTRAMUSCULAR | Status: DC

## 2013-07-07 MED ORDER — SODIUM CHLORIDE 0.9 % IJ SOLN: 3.0000 mL | INTRAMUSCULAR | Status: DC | PRN

## 2013-07-07 MED ORDER — FUROSEMIDE 10 MG/ML IJ SOLN
40.0000 mg | Freq: Two times a day (BID) | INTRAMUSCULAR | Status: DC
  Administered 2013-07-07 – 2013-07-09 (×4): 40 mg via INTRAVENOUS
  Filled 2013-07-07 (×4): qty 4

## 2013-07-07 MED ORDER — LOSARTAN POTASSIUM 50 MG PO TABS
50.0000 mg | ORAL_TABLET | Freq: Every day | ORAL | Status: DC
  Administered 2013-07-07 – 2013-07-12 (×6): 50 mg via ORAL
  Filled 2013-07-07 (×7): qty 1

## 2013-07-07 MED ORDER — SODIUM CHLORIDE 0.9 % IV SOLN: 250.0000 mL | INTRAVENOUS | Status: DC | PRN

## 2013-07-07 MED ORDER — ONDANSETRON HCL 4 MG/2ML IJ SOLN: 4.0000 mg | INTRAMUSCULAR | Status: DC | PRN

## 2013-07-07 NOTE — ED Notes (Signed)
ED tech notified this nurse that patient was assisted to restroom via wheelchair on room air. ED tech reports patient complained of increasing shortness of breath with exertion and room air oxygen saturation after returning to room was 84%.

## 2013-07-07 NOTE — Progress Notes (Signed)
NAMEVIVIAN, NEUWIRTH               ACCOUNT NO.:  [id number]  MEDICAL RECORD NO.:  [id number]  LOCATION:  A324                          FACILITY:  [facility]  PHYSICIAN:  Kingsley Callander. Ouida Sills, MD       DATE OF BIRTH:  Oct 25, 1945  DATE OF PROCEDURE: DATE OF DISCHARGE:                                PROGRESS NOTE   [name] was admitted last night by the hospitalist.  She presented with shortness of breath and especially dyspnea on exertion.  She was found to have evidence of pulmonary edema on her chest x-ray and on a CT angiogram of the chest.  She has a history of mitral valve disease having had a mitral commissurotomy back in the 1960s.  She has never shown evidence of heart failure.  She had a mildly elevated D-dimer which was evaluated with the CT angiogram of the chest which revealed no pulmonary embolus.  She is on Xarelto after previously having had a stroke and a clot in her left atrial appendage.  She notes she has had some mild lower extremity swelling.  She has not had orthopnea or PND. She has had cardiac enzymes revealing no troponin elevations.  Her proBNP was 661.  She was not hypoxic initially with an oxygen saturation on room air of 94%, but dropped to 84% after getting up to go to the bathroom.  After being started on 2 L by nasal cannula, she had an ABG which revealed a pH of 7.45 with a pCO2 of 35 and pO2 of 90.5.  PHYSICAL EXAMINATION:  GENERAL:  She is resting comfortably this morning and does not feel dyspneic at rest. LUNGS:  Clear. HEART:  Regular with grade 2 systolic murmur. ABDOMEN:  Soft and nontender. EXTREMITIES:  Reveal no edema.  LABORATORY DATA:  Her hemoglobin is normal at 13.5.  She is thrombocytopenic with a platelet count of 84,000, white count is 8.5.  IMPRESSION: 1. Pulmonary edema, history of mitral valve disease.  We will evaluate     further with an echocardiogram.  We will consult Cardiology.  She     improved after treatment with IV Lasix.   This will be continued. 2. History of stroke.  Continue Xarelto. 3. History of rheumatic heart disease. 4. Hypertension. 5. Diabetes. 6. History of breast carcinoma. 7. Chronic cough previously evaluated by Pulmonary.     Kingsley Callander. Ouida Sills, MD     ROF/MEDQ  D:  07/07/2013  T:  07/07/2013  Job:  [id number]

## 2013-07-07 NOTE — ED Notes (Signed)
Patient complaining of increasing shortness of breath. States gets increased shortness of breath when trying to change position in bed. Dr. Deretha Emory notified. Stated for patient to receive nebulizer treatment and lasix.

## 2013-07-07 NOTE — H&P (Signed)
History and Physical  [name] ZOX:[id number] DOB: Apr 06, 1946 DOA: 07/06/2013  Referring physician: Dr. Deretha Emory PCP: Carylon Perches, MD  Gastroenterology: Dr. Jarold Motto Pulmonology: Dr. Shelle Iron  Chief Complaint: Shortness of breath  HPI:  67 year old woman presented to the emergency department with shortness of breath and chest pain for one to 2 weeks. Initial evaluation was notable for hypoxia, pulmonary edema, pleural effusions, negative troponin, nonacute EKG. Treated with Lasix and breathing treatments.. Dr. Deretha Emory discussed with Dr. Orvan Falconer and patient was accepted for admission to Dr. Ouida Sills service.  History obtained from patient and chart review. She has had a chronic cough since March, initially was diagnosed with cough. Asthma but seems to have not responded to inhaled steroids. She has been following up both by pulmonology and more recently gastroenterology. She was referred to GI to rule out GERD/esophageal issues as a cause for cough. She had manometer testing in the last 2 weeks which by report was unremarkable and a normal esophagram several days ago. He continues on PPI therapy and Carafate. She has a history of cardioembolic stroke, continues on Xarelto, but has no history of coronary disease.  For the last 2 weeks she has noted increasing dyspnea on exertion relieved with resting as well as lower chest discomfort with eating. No other specific aggravating or alleviating factors. Last night her breathing became short while at rest and given the marked worsening she came to the hospital. She also noticed that she had some chest discomfort yesterday more so than usual.  In the emergency department noted to be afebrile with stable vital signs except for hypoxia on room air. ABG was unremarkable, complete metabolic panel, troponin normal. BNP 661. CBC notable for thrombocytopenia. EKG independently reviewed showed sinus tachycardia with no acute changes. CT angiogram of the chest  negative for pulmonary embolism; pulmonary edema and pleural effusions seen.  Review of Systems:  Negative for fever, visual changes, sore throat, rash, new muscle aches, dysuria, bleeding, n/v/abdominal pain.  Past Medical History  Diagnosis Date  . Rheumatic heart disease     mild AS/AI and mild MS/trivial MR; cardiac cath in 1993; chronic class II dyspnea on exertion  . Gastroesophageal reflux disease   . Hyperlipidemia   . Hypertension     pt denies 05/30/13  . Cardioembolic stroke 01/2012    Right frontal in 01/2012; normal carotid ultrasound; possible LAA thrombus by TEE; virtual complete neurologic recovery  . Fasting hyperglycemia     120 fasting  . Chronic kidney disease, stage 2, mildly decreased GFR     GFR of approximately 60  . Breast carcinoma 1995    1995  . Depression   . Diverticulosis of colon (without mention of hemorrhage) 2012    Dr. Karilyn Cota  . Gastroparesis   . Hemorrhoids   . Anemia     Past Surgical History  Procedure Laterality Date  . Mitral valve surgery  [facility], closed mitral valvulotomy by finger fracture  . Cardiac catheterization    . Dilation and curettage of uterus    . Breast lumpectomy Right 1995  . Tubal ligation  1973  . Tee without cardioversion  01/24/2012    Procedure: TRANSESOPHAGEAL ECHOCARDIOGRAM (TEE);  Surgeon: Lewayne Bunting, MD;  Location: [facility] ENDOSCOPY;  Service: Cardiovascular;  Laterality: N/A;  . Colonoscopy  2012    Negative screening procedure  . Esophageal manometry N/A 06/17/2013    Procedure: ESOPHAGEAL MANOMETRY (EM);  Surgeon: Mardella Layman, MD;  Location: [facility] ENDOSCOPY;  Service: Endoscopy;  Laterality: N/A;    Social History:  reports that she has never smoked. She has never used smokeless tobacco. She reports that she does not drink alcohol or use illicit drugs.  No Known Allergies  Family History  Problem Relation Age of Onset  . Heart disease Brother 50    MI  . Rheum arthritis Maternal  Grandmother   . Asthma Maternal Grandfather   . Hypothyroidism Mother   . Diabetes Sister   . Cirrhosis Father   . Cervical cancer Sister     malignant neoplasm of cervix uteri     Prior to Admission medications   Medication Sig Start Date End Date Taking? Authorizing Provider  albuterol (VENTOLIN HFA) 108 (90 BASE) MCG/ACT inhaler Inhale 2 puffs into the lungs every 4 (four) hours as needed for wheezing.   Yes Historical Provider, MD  atorvastatin (LIPITOR) 10 MG tablet Take 20 mg by mouth daily. 03/02/12  Yes Kathlen Brunswick, MD  beclomethasone (QVAR) 80 MCG/ACT inhaler Inhale 2 puffs into the lungs 2 (two) times daily. 06/27/13  Yes Barbaraann Share, MD  buPROPion (WELLBUTRIN XL) 150 MG 24 hr tablet Take 150 mg by mouth daily.     Yes Historical Provider, MD  dexlansoprazole (DEXILANT) 60 MG capsule Take 1 capsule (60 mg total) by mouth daily. 05/30/13  Yes Mardella Layman, MD  hyoscyamine (LEVSIN SL) 0.125 MG SL tablet Place 1 tablet (0.125 mg total) under the tongue every 4 (four) hours as needed for cramping. 07/03/13  Yes Mardella Layman, MD  levothyroxine (SYNTHROID, LEVOTHROID) 112 MCG tablet Take 112 mcg by mouth daily before breakfast.   Yes Historical Provider, MD  losartan (COZAAR) 100 MG tablet Take 50 mg by mouth daily.     Yes Historical Provider, MD  Multiple Vitamin (MULITIVITAMIN WITH MINERALS) TABS Take 1 tablet by mouth daily.   Yes Historical Provider, MD  Rivaroxaban (XARELTO) 20 MG TABS Take 1 tablet (20 mg total) by mouth daily with supper. 04/01/13  Yes Kathlen Brunswick, MD  sucralfate (CARAFATE) 1 GM/10ML suspension Can take every 4 hours as needed 07/03/13  Yes Mardella Layman, MD   Physical Exam: Filed Vitals:   07/07/13 0500 07/07/13 0505 07/07/13 0600 07/07/13 0643  BP: 172/78  147/68 154/86  Pulse: 100  93 94  Temp:    98.2 F (36.8 C)  TempSrc:    Oral  Resp: 19  19 20   SpO2: 100% 100% 96% 92%   General: Appears calm and comfortable lying in  bed. Eyes: PERRL, normal lids, irises ENT: grossly normal hearing, lips & tongue Neck: no LAD, masses or thyromegaly Cardiovascular: RRR, no m/r/g. No LE edema. Respiratory: Fair air movement. No wheezing or rhonchi. Some respiratory crackles posteriorly, especially right base. Normal work of breathing. Abdomen: soft, ntnd Skin: no rash or induration seen  Musculoskeletal: grossly normal tone BUE/BLE Psychiatric: grossly normal mood and affect, speech fluent and appropriate Neurologic: grossly non-focal.  Wt Readings from Last 3 Encounters:  07/03/13 77.111 kg (170 lb)  05/30/13 77.735 kg (171 lb 6 oz)  05/14/13 79.017 kg (174 lb 3.2 oz)    Labs on Admission:  Basic Metabolic Panel:  Recent Labs Lab 07/07/13  NA 135  K 3.5  CL 99  CO2 24  GLUCOSE 158*  BUN 11  CREATININE 0.94  CALCIUM 9.5    Liver Function Tests:  Recent Labs Lab 07/07/13  AST 26  ALT 25  ALKPHOS 77  BILITOT  0.4  PROT 7.4  ALBUMIN 3.6   CBC:  Recent Labs Lab 07/07/13  WBC 8.5  NEUTROABS 6.0  HGB 13.5  HCT 41.5  MCV 89.8  PLT 84*    Cardiac Enzymes:  Recent Labs Lab 07/07/13 07/07/13 0458  TROPONINI <0.30 <0.30     Recent Labs  07/07/13  PROBNP 661.1*    Radiological Exams on Admission: Dg Chest 2 View  07/07/2013   CLINICAL DATA:  Shortness of breath and chest pain  EXAM: CHEST  2 VIEW  COMPARISON:  09/17/2012  FINDINGS: Moderate enlargement of the cardiomediastinal silhouette is noted. Center vascular congestion is present with a few Kerley B-lines peripherally and trace effusions. Lung volumes are low, with elevation of the right hemidiaphragm and probable superimposed right greater than left lower lobe atelectasis. Right axillary clips are noted with evidence of right lumpectomy.  IMPRESSION: Moderate cardiomegaly with findings suggestive of interstitial pulmonary edema. Followup after diuresis may be helpful if there is strong clinical suspicion for pneumonia which could be  masked.   Electronically Signed   By: Christiana Pellant M.D.   On: 07/07/2013 00:59   Ct Angio Chest Pe W/cm &/or Wo Cm  07/07/2013   *RADIOLOGY REPORT*  Clinical Data: Shortness of breath and chest pain; back pain.  CT ANGIOGRAPHY CHEST  Technique:  Multidetector CT imaging of the chest using the standard protocol during bolus administration of intravenous contrast. Multiplanar reconstructed images including MIPs were obtained and reviewed to evaluate the vascular anatomy.  Contrast: OMNIPAQUE IOHEXOL 350 MG/ML SOLN  Comparison: Chest radiograph performed in the today at 12:22 a.m., and CT of the chest performed 02/19/2009  Findings: There is no evidence of pulmonary embolus.  A moderate right-sided pleural effusion is noted, and a small loculated left-sided pleural effusion is seen.  There is diffuse interstitial prominence, likely reflecting mild pulmonary edema. There is no evidence of pneumothorax.  No masses are identified; no abnormal focal contrast enhancement is seen.  The mediastinum is unremarkable in appearance.  No mediastinal lymphadenopathy is seen.  Visualized mediastinal nodes remain normal in size.  No pericardial effusion is identified.  The great vessels are grossly unremarkable in appearance.  No axillary lymphadenopathy is seen.  The visualized portions of the thyroid gland are unremarkable in appearance.  The visualized portions of the liver and spleen are unremarkable.  No acute osseous abnormalities are seen.  IMPRESSION:  1.  No evidence of pulmonary embolus. 2.  Moderate right-sided pleural effusion and small loculated left- sided pleural effusion; diffuse interstitial prominence likely reflects pulmonary edema.   Original Report Authenticated By: Tonia Ghent, M.D.   Dg Esophagus  07/05/2013   CLINICAL DATA:  Chest pain. Cough. Dysphagia. Recent endoscopy.  EXAM: ESOPHOGRAM / BARIUM SWALLOW / BARIUM TABLET STUDY  TECHNIQUE: Combined double contrast and single contrast  examination performed using effervescent crystals, thick barium liquid, and thin barium liquid. The patient was observed with fluoroscopy swallowing a 13mm barium sulphate tablet.  COMPARISON:  None.  FLUOROSCOPY TIME:  1 min 28 seconds  FINDINGS: No evidence of esophageal mass or stricture. No radiographic signs of esophagitis. No evidence of hiatal hernia.  Esophageal motility is within normal limits. No gastroesophageal reflux seen during the exam. A 13 mm barium tablet passed freely through the esophagus and into the stomach.  IMPRESSION: Normal study. No radiographic abnormality of the esophagus identified.   Electronically Signed   By: Myles Rosenthal M.D.   On: 07/05/2013 11:58    EKG:  Independently reviewed. As above   Principal Problem:   Acute respiratory failure with hypoxia Active Problems:   Gastroesophageal reflux disease   Chronic anticoagulation   Cardioembolic stroke   Dyspnea on exertion   Rheumatic heart disease   Chronic cough   Acute pulmonary edema   Pleural effusion, bilateral   Assessment/Plan 1. Acute hypoxic respiratory failure: Improved with Lasix and nebulizer treatment. ABG reassuring. Suspect secondary to pulmonary edema, possibly complicated by pulmonary effusion. Continue empiric diuresis, bronchodilators. Repeat chest x-ray in the morning. 2. Pulmonary edema, Pulmonary effusions: Possible heart failure of unclear etiology and type. No current chest pain. Cardiac enzymes negative thus far. Check 2-D echocardiogram. Repeat chest x-ray in the morning to assess response to diuresis. Significance of loculated left pleural effusions are clear. White blood cell count is normal she is afebrile, infection is not suspected at this point. 3. Atypical Chest pain: Present intermittently for 2 weeks. Suspect secondary to GERD by history. Plan as above. ACS is doubted. 4. Thrombocytopenia: May be spurious. Repeat CBC in the morning. If consistent, and may need to discontinue  Xarelto. 5. GERD: Possible esophageal spasm per GI note. PPI therapy, Carafate. 6. History of cardioembolic stroke, possible left atrial appendage thrombus: Continue Xarelto.  7. Chronic, long-standing cough: Discontinue Cozaar for now.   We will plan further diuresis, continue oxygen and bronchodilators. Echocardiogram and repeat chest x-ray in the morning. Discussed results of testing, current diagnoses and treatment plan with patient and family.  Code Status: full code  DVT prophylaxis:Xarelto Family Communication: husband at bedside Disposition Plan/Anticipated LOS: admit to Dr. Ouida Sills. 2-3 days.  Time spent: 50  minutes  Brendia Sacks, MD  [facility] Pager [phone] 07/07/2013, 7:28 AM

## 2013-07-07 NOTE — ED Notes (Signed)
Patient assist to restroom in wheel chair,room air 02 down to 84% s.o.b.

## 2013-07-08 ENCOUNTER — Inpatient Hospital Stay (HOSPITAL_COMMUNITY): Payer: Medicare Other

## 2013-07-08 DIAGNOSIS — I359 Nonrheumatic aortic valve disorder, unspecified: Secondary | ICD-10-CM

## 2013-07-08 DIAGNOSIS — R0602 Shortness of breath: Secondary | ICD-10-CM

## 2013-07-08 DIAGNOSIS — I050 Rheumatic mitral stenosis: Secondary | ICD-10-CM | POA: Insufficient documentation

## 2013-07-08 LAB — CBC
HCT: 40.9 % (ref 36.0–46.0)
Hemoglobin: 13.3 g/dL (ref 12.0–15.0)
MCH: 29.1 pg (ref 26.0–34.0)
MCHC: 32.5 g/dL (ref 30.0–36.0)
MCV: 89.5 fL (ref 78.0–100.0)
RDW: 14.6 % (ref 11.5–15.5)

## 2013-07-08 LAB — BASIC METABOLIC PANEL
BUN: 13 mg/dL (ref 6–23)
CO2: 26 mEq/L (ref 19–32)
Calcium: 9 mg/dL (ref 8.4–10.5)
Chloride: 99 mEq/L (ref 96–112)
Creatinine, Ser: 1.03 mg/dL (ref 0.50–1.10)
Glucose, Bld: 124 mg/dL — ABNORMAL HIGH (ref 70–99)

## 2013-07-08 LAB — PRO B NATRIURETIC PEPTIDE: Pro B Natriuretic peptide (BNP): 459.2 pg/mL — ABNORMAL HIGH (ref 0–125)

## 2013-07-08 MED ORDER — HYDROCODONE-HOMATROPINE 5-1.5 MG/5ML PO SYRP
5.0000 mL | ORAL_SOLUTION | ORAL | Status: DC | PRN
  Administered 2013-07-08 – 2013-07-11 (×5): 5 mL via ORAL
  Filled 2013-07-08 (×5): qty 5

## 2013-07-08 MED ORDER — POTASSIUM CHLORIDE CRYS ER 20 MEQ PO TBCR
20.0000 meq | EXTENDED_RELEASE_TABLET | Freq: Three times a day (TID) | ORAL | Status: AC
  Administered 2013-07-08 – 2013-07-09 (×6): 20 meq via ORAL
  Filled 2013-07-08 (×6): qty 1

## 2013-07-08 MED ORDER — METOPROLOL TARTRATE 25 MG PO TABS
25.0000 mg | ORAL_TABLET | Freq: Two times a day (BID) | ORAL | Status: DC
  Administered 2013-07-08 – 2013-07-12 (×8): 25 mg via ORAL
  Filled 2013-07-08 (×10): qty 1

## 2013-07-08 MED ORDER — ENOXAPARIN SODIUM 80 MG/0.8ML ~~LOC~~ SOLN
75.0000 mg | Freq: Two times a day (BID) | SUBCUTANEOUS | Status: DC
  Administered 2013-07-08 – 2013-07-09 (×3): 75 mg via SUBCUTANEOUS
  Filled 2013-07-08 (×6): qty 0.8

## 2013-07-08 NOTE — Progress Notes (Signed)
[name], [name]               ACCOUNT NO.:  [id number]  MEDICAL RECORD NO.:  [id number]  LOCATION:  A324                          FACILITY:  [facility]  PHYSICIAN:  Kingsley Callander. Ouida Sills, MD       DATE OF BIRTH:  July 01, 1946  DATE OF PROCEDURE: DATE OF DISCHARGE:                                PROGRESS NOTE   [name] is breathing comfortably this morning.  She continues to experience her chronic cough.  She had recorded urine output of only 600 which is not felt to likely be a complete record.  She denies any edema. She does not have orthopnea.  PHYSICAL EXAMINATION:  VITAL SIGNS:  Temperature 98.1, pulse 65, respirations 20, blood pressure 147/72, oxygen saturation 96% on supplemental oxygen. LUNGS:  Clear. HEART:  Regular with occasional ectopy with a grade 2 systolic murmur. EXTREMITIES:  No edema.  IMPRESSION AND PLAN: 1. New onset pulmonary edema, etiology unclear.  Echocardiogram today.     Cardiology consult today.  BNP is down from 661 to 459. 2. Rheumatic heart disease, status post mitral commissurotomy. 3. Hypokalemia, serum potassium is dropped to 3.3 with diuresis, we     will supplement orally.  BUN and creatinine are 13 and 1.03. 4. Thrombocytopenia, platelet count is stable at 87,000. 5. History of stroke, continue Xarelto. 6. Chronic cough.  No significant change. 7. Diabetes.  Fasting glucose is 124.     Kingsley Callander. Ouida Sills, MD     ROF/MEDQ  D:  07/08/2013  T:  07/08/2013  Job:  [id number]

## 2013-07-08 NOTE — Care Management Note (Signed)
    Page 1 of 1   07/09/2013     2:49:38 PM   CARE MANAGEMENT NOTE 07/09/2013  Patient:  [name], [name]   Account Number:  [id number]  Date Initiated:  07/08/2013  Documentation initiated by:  Rosemary Holms  Subjective/Objective Assessment:   Pt admitted from home where he lives with his spouse. Anticipate DC home.     Action/Plan:   Anticipated DC Date:  07/10/2013   Anticipated DC Plan:  HOME/SELF CARE      DC Planning Services  CM consult      Choice offered to / List presented to:             Status of service:  Completed, signed off Medicare Important Message given?   (If response is "NO", the following Medicare IM given date fields will be blank) Date Medicare IM given:   Date Additional Medicare IM given:    Discharge Disposition:  ACUTE TO ACUTE TRANS  Per UR Regulation:    If discussed at Long Length of Stay Meetings, dates discussed:    Comments:  07/08/13 Rosemary Holms RN BNS CM

## 2013-07-08 NOTE — Progress Notes (Signed)
*  PRELIMINARY RESULTS* Echocardiogram 2D Echocardiogram has been performed.  [name] 07/08/2013, 10:01 AM

## 2013-07-08 NOTE — Progress Notes (Signed)
Utilization Review Complete  

## 2013-07-08 NOTE — Consult Note (Addendum)
  Primary cardiologist: Consulting cardiologist: Sara Madole MD  CC: Shortness of breath  Clinical Summary [name] is a 67 y.o.female with history of rheumatic valvular disease (Aortic valve and mitral valve), HL, HTN, cardioembolic CVA on xarelto, CKD stage 2 admitted with SOB.  She reports a 3 week history of worsening DOE and LE edema. She chronically sleeps on a wedge at home due to a history of gastroparesis per her report. She describes some chest discomfort after meals, but no exertional chest pain. She reports she has had a chronic cough for years with extensive workup with no clear indication.  No palpitations, no n/v,no diaphoresis.  In the ER she was found to be in pulmonary edema, she responded to aggressive diuresis. CT PE was negativeThis morning her SOB is significantly improved.     No Known Allergies  Medications Scheduled Medications: . atorvastatin  20 mg Oral Daily  . buPROPion  150 mg Oral Daily  . fluticasone  1 puff Inhalation BID  . furosemide  40 mg Intravenous BID  . levothyroxine  112 mcg Oral QAC breakfast  . losartan  50 mg Oral Daily  . pantoprazole  40 mg Oral Daily  . potassium chloride  20 mEq Oral TID  . rivaroxaban  20 mg Oral Q supper  . sodium chloride  3 mL Intravenous Q12H  . sucralfate  1 g Oral TID WC & HS     Infusions:     PRN Medications:  sodium chloride, acetaminophen, albuterol, guaiFENesin-dextromethorphan, hyoscyamine, ondansetron (ZOFRAN) IV, sodium chloride   Past Medical History  Diagnosis Date  . Rheumatic heart disease     mild AS/AI and mild MS/trivial MR; cardiac cath in 1993; chronic class II dyspnea on exertion  . Gastroesophageal reflux disease   . Hyperlipidemia   . Hypertension     pt denies 05/30/13  . Cardioembolic stroke 01/2012    Right frontal in 01/2012; normal carotid ultrasound; possible LAA thrombus by TEE; virtual complete neurologic recovery  . Fasting hyperglycemia     120 fasting  .  Chronic kidney disease, stage 2, mildly decreased GFR     GFR of approximately 60  . Breast carcinoma 1995    1995  . Depression   . Diverticulosis of colon (without mention of hemorrhage) 2012    Dr. Rehman  . Gastroparesis   . Hemorrhoids   . Anemia     Past Surgical History  Procedure Laterality Date  . Mitral valve surgery  1962    [facility], closed mitral valvulotomy by finger fracture  . Cardiac catheterization    . Dilation and curettage of uterus    . Breast lumpectomy Right 1995  . Tubal ligation  1973  . Tee without cardioversion  01/24/2012    Procedure: TRANSESOPHAGEAL ECHOCARDIOGRAM (TEE);  Surgeon: Brian S Crenshaw, MD;  Location: [facility] ENDOSCOPY;  Service: Cardiovascular;  Laterality: N/A;  . Colonoscopy  2012    Negative screening procedure  . Esophageal manometry N/A 06/17/2013    Procedure: ESOPHAGEAL MANOMETRY (EM);  Surgeon: David R Patterson, MD;  Location: [facility] ENDOSCOPY;  Service: Endoscopy;  Laterality: N/A;    Family History  Problem Relation Age of Onset  . Heart disease Brother 50    MI  . Rheum arthritis Maternal Grandmother   . Asthma Maternal Grandfather   . Hypothyroidism Mother   . Diabetes Sister   . Cirrhosis Father   . Cervical cancer Sister     malignant neoplasm of cervix uteri      Social History [name] reports that she has never smoked. She has never used smokeless tobacco. [name] reports that she does not drink alcohol.  Review of Systems CONSTITUTIONAL: No weight loss, fever, chills, weakness or fatigue.   HEENT: Eyes: No visual loss, blurred vision, double vision or yellow sclerae. No hearing loss, sneezing, congestion, runny nose or sore throat.   SKIN: No rash or itching.   CARDIOVASCULAR:Per HPI  RESPIRATORY: Per HPI  GASTROINTESTINAL: No anorexia, nausea, vomiting or diarrhea. No abdominal pain or blood.   GENITOURINARY: no polyuria, no dysuria  NEUROLOGICAL: No headache, dizziness, syncope, paralysis, ataxia,  numbness or tingling in the extremities. No change in bowel or bladder control.   MUSCULOSKELETAL: No muscle, back pain, joint pain or stiffness.   HEMATOLOGIC: No anemia, bleeding or bruising.   LYMPHATICS: No enlarged nodes. No history of splenectomy.   PSYCHIATRIC: No history of depression or anxiety.      Physical Examination Blood pressure 147/72, pulse 65, temperature 98.1 F (36.7 C), temperature source Oral, resp. rate 20, height 5' 2" (1.575 m), weight 168 lb 11.2 oz (76.522 kg), SpO2 96.00%.  Intake/Output Summary (Last 24 hours) at 07/08/13 0954 Last data filed at 07/08/13 0500  Gross per 24 hour  Intake    960 ml  Output    600 ml  Net    360 ml    HEENT: sclera clear, pupils equal round and reactive  Cardiovascular: RRR, 3/6 mid to late peaking systolic murmur RUSB, faint diastolic murmur at the apex, no JVD  Respiratory: CTAB  GI: abdomen soft, NT, ND  MSK: extremities warm, no edema  Neuro: no focal deficits  Psych: appropriate affect   Lab Results  Basic Metabolic Panel:  Recent Labs Lab 07/07/13 07/08/13 0550  NA 135 136  K 3.5 3.3*  CL 99 99  CO2 24 26  GLUCOSE 158* 124*  BUN 11 13  CREATININE 0.94 1.03  CALCIUM 9.5 9.0    Liver Function Tests:  Recent Labs Lab 07/07/13  AST 26  ALT 25  ALKPHOS 77  BILITOT 0.4  PROT 7.4  ALBUMIN 3.6    CBC:  Recent Labs Lab 07/07/13 07/08/13 0550  WBC 8.5 7.9  NEUTROABS 6.0  --   HGB 13.5 13.3  HCT 41.5 40.9  MCV 89.8 89.5  PLT 84* 87*    Cardiac Enzymes:  Recent Labs Lab 07/07/13 07/07/13 0458 07/07/13 1050 07/07/13 1726  TROPONINI <0.30 <0.30 <0.30 <0.30    BNP: No components found with this basename: POCBNP,    ECG:    Imaging 07/07/13 CXR FINDINGS:  Moderate enlargement of the cardiomediastinal silhouette is noted.  Center vascular congestion is present with a few Kerley B-lines  peripherally and trace effusions. Lung volumes are low, with  elevation of the  right hemidiaphragm and probable superimposed right  greater than left lower lobe atelectasis. Right axillary clips are  noted with evidence of right lumpectomy.  IMPRESSION:  Moderate cardiomegaly with findings suggestive of interstitial  pulmonary edema. Followup after diuresis may be helpful if there is  strong clinical suspicion for pneumonia which could be masked.   07/07/13 CT chest CT ANGIOGRAPHY CHEST  Technique: Multidetector CT imaging of the chest using the  standard protocol during bolus administration of intravenous  contrast. Multiplanar reconstructed images including MIPs were  obtained and reviewed to evaluate the vascular anatomy.  Contrast: 100mL OMNIPAQUE IOHEXOL 350 MG/ML SOLN  Comparison: Chest radiograph performed in the today at 12:22 a.m.,    and CT of the chest performed 02/19/2009  Findings: There is no evidence of pulmonary embolus.  A moderate right-sided pleural effusion is noted, and a small  loculated left-sided pleural effusion is seen. There is diffuse  interstitial prominence, likely reflecting mild pulmonary edema.  There is no evidence of pneumothorax. No masses are identified; no  abnormal focal contrast enhancement is seen.  The mediastinum is unremarkable in appearance. No mediastinal  lymphadenopathy is seen. Visualized mediastinal nodes remain  normal in size. No pericardial effusion is identified. The great  vessels are grossly unremarkable in appearance. No axillary  lymphadenopathy is seen. The visualized portions of the thyroid  gland are unremarkable in appearance.  The visualized portions of the liver and spleen are unremarkable.  No acute osseous abnormalities are seen.  IMPRESSION:  1. No evidence of pulmonary embolus.  2. Moderate right-sided pleural effusion and small loculated left-  sided pleural effusion; diffuse interstitial prominence likely  reflects pulmonary edema.   4/13 TEE LVEF 55-60%, no WMA, mild to moderate MS with  mean grad 8, mild MR, valve area 1.85, mod LAE, spontaneous echo contrast in LA,    07/2012 TTE: LVEF 55-60%, no WMA, mild to mod AS (mean grad 19 mmHg, indexed area 0.39 reported, VTI LVOT to AVI 0.18), mild AI, mild to moderate MS (valve area 1.34, mean grad 6)   Assessment/Plan  1. Pulmonary edema - likely related to progression of her MV disease, last evaluation a year ago showed at least moderate MS. She also of note has significant aortic stenosis (low gradient, severe by area) which is difficult to accurately assess in the setting of her significant MS - will start beta blocker in setting of mitral stenosis - continue diuretic - will follow up results of echo today - pending results, she may need further evaluation by CT surgery, with consideration for possible single or double valve replacement. Balloon valvuloplasty would likely only exacerbate her AV lesion. Pending further information, she may warrant a transfer to [facility] to begin that process.   2. History of cardioembolic CVA - on xarelto, would need to be off 2-3 days for invasive procedure if needed. - will hold xarelto, last dose 08/07/13 around 5 pm, cover with lovenox.         Tarvaris Puglia, M.D., F.A.C.C.  

## 2013-07-09 ENCOUNTER — Telehealth: Payer: Self-pay | Admitting: *Deleted

## 2013-07-09 DIAGNOSIS — I050 Rheumatic mitral stenosis: Secondary | ICD-10-CM

## 2013-07-09 LAB — CBC
Hemoglobin: 13.2 g/dL (ref 12.0–15.0)
MCH: 28.9 pg (ref 26.0–34.0)
MCHC: 32.3 g/dL (ref 30.0–36.0)
Platelets: 93 10*3/uL — ABNORMAL LOW (ref 150–400)
RBC: 4.56 MIL/uL (ref 3.87–5.11)
RDW: 14.4 % (ref 11.5–15.5)

## 2013-07-09 LAB — BASIC METABOLIC PANEL
BUN: 18 mg/dL (ref 6–23)
Creatinine, Ser: 1.18 mg/dL — ABNORMAL HIGH (ref 0.50–1.10)
GFR calc Af Amer: 53 mL/min — ABNORMAL LOW (ref >90)
GFR calc non Af Amer: 46 mL/min — ABNORMAL LOW (ref >90)
Glucose, Bld: 174 mg/dL — ABNORMAL HIGH (ref 70–99)
Potassium: 3.9 mEq/L (ref 3.5–5.1)
Sodium: 136 mEq/L (ref 135–145)

## 2013-07-09 MED ORDER — FUROSEMIDE 40 MG PO TABS
40.0000 mg | ORAL_TABLET | Freq: Every day | ORAL | Status: DC
  Administered 2013-07-10 – 2013-07-12 (×3): 40 mg via ORAL
  Filled 2013-07-09 (×3): qty 1

## 2013-07-09 NOTE — Telephone Encounter (Signed)
Spoke with pt's husband who stated pt got dyspneic and cyanotic Saturday evening and was admitted to [facility]. Pt will be transferred to [facility] for heart cath and 1-2 valve replacements. He states they knew eventually the Mitral would need replacement; replaced at age 67. Doctors are not sure of the Aortic. Informed spouse maybe that is what was causing pt's symptoms and maybe she will be better now. Informed him the BS was normal.

## 2013-07-09 NOTE — Telephone Encounter (Signed)
Message copied by Florene Glen on Tue Jul 09, 2013 10:51 AM ------      Message from: PATTERSON, DAVID R      Created: Mon Jul 08, 2013  8:25 AM       BariumSwallow is normal.  She should continue medicines as previously outlined with when necessary sublingual Levsin use.  I do not think further GI evaluation is needed.  If she wants further evaluation, we should refer her to [facility]. ------

## 2013-07-09 NOTE — Progress Notes (Signed)
Patient ID: [name], female   DOB: 1946-04-27, 67 y.o.   MRN: [id number]     Primary cardiologist:  Subjective:   Patient reports breathing improved this morning.    Objective:   Temp:  [97.9 F (36.6 C)-98.4 F (36.9 C)] 98.4 F (36.9 C) (10/07 0535) Pulse Rate:  [64-87] 64 (10/07 0535) Resp:  [20] 20 (10/07 0535) BP: (111-136)/(61-74) 118/61 mmHg (10/07 0535) SpO2:  [95 %-100 %] 99 % (10/07 0709) Weight:  [168 lb 8 oz (76.431 kg)] 168 lb 8 oz (76.431 kg) (10/07 0500) Last BM Date: 07/08/13  Filed Weights   07/07/13 1700 07/08/13 0507 07/09/13 0500  Weight: 170 lb (77.111 kg) 168 lb 11.2 oz (76.522 kg) 168 lb 8 oz (76.431 kg)    Intake/Output Summary (Last 24 hours) at 07/09/13 1043 Last data filed at 07/09/13 0500  Gross per 24 hour  Intake   1020 ml  Output      0 ml  Net   1020 ml    Telemetry:  Exam:  HEENT: sclera clear, pupils equal round and reactive   Cardiovascular: RRR, 3/6 mid to late peaking systolic murmur RUSB, faint diastolic murmur at the apex, no JVD   Respiratory: CTAB   GI: abdomen soft, NT, ND   MSK: extremities warm, no edema   Neuro: no focal deficits   Psych: appropriate affect   Lab Results:  Basic Metabolic Panel:  Recent Labs Lab 07/07/13 07/08/13 0550 07/09/13 0547  NA 135 136 136  K 3.5 3.3* 3.9  CL 99 99 100  CO2 24 26 25   GLUCOSE 158* 124* 174*  BUN 11 13 18   CREATININE 0.94 1.03 1.18*  CALCIUM 9.5 9.0 9.2    Liver Function Tests:  Recent Labs Lab 07/07/13  AST 26  ALT 25  ALKPHOS 77  BILITOT 0.4  PROT 7.4  ALBUMIN 3.6    CBC:  Recent Labs Lab 07/07/13 07/08/13 0550 07/09/13 0547  WBC 8.5 7.9 8.1  HGB 13.5 13.3 13.2  HCT 41.5 40.9 40.9  MCV 89.8 89.5 89.7  PLT 84* 87* 93*    Cardiac Enzymes:  Recent Labs Lab 07/07/13 0458 07/07/13 1050 07/07/13 1726  TROPONINI <0.30 <0.30 <0.30    BNP:  Recent Labs  07/07/13 07/08/13 0550  PROBNP 661.1* 459.2*     Coagulation:  Recent Labs Lab 07/07/13  INR 1.63*    ECG:   Medications:   Scheduled Medications: . atorvastatin  20 mg Oral Daily  . buPROPion  150 mg Oral Daily  . enoxaparin  75 mg Subcutaneous Q12H  . fluticasone  1 puff Inhalation BID  . [START ON 07/10/2013] furosemide  40 mg Oral Daily  . levothyroxine  112 mcg Oral QAC breakfast  . losartan  50 mg Oral Daily  . metoprolol tartrate  25 mg Oral BID  . pantoprazole  40 mg Oral Daily  . potassium chloride  20 mEq Oral TID  . sodium chloride  3 mL Intravenous Q12H  . sucralfate  1 g Oral TID WC & HS     Infusions:     PRN Medications:  sodium chloride, acetaminophen, albuterol, guaiFENesin-dextromethorphan, HYDROcodone-homatropine, hyoscyamine, ondansetron (ZOFRAN) IV, sodium chloride     Assessment/Plan     1. Severe mitral stenosis - most recent echo shows mean grad 12 mmHg, valve area 1.3 by PHT however given presentation of pulmonary edema, severely elevated LA pressures on echo, and PASP of 60 she this is consistent with severe symptomatic mitral  valve stenosis with elevated PASP, indications for intervention - will transfer for [facility] for further evaluation. Exact strategy for MV intervention unclear at this time, LHC/RHC with possible AV study would be helpful in deciding on intervention strategy. She will also need to see CT surgery. The exact severity of her aortic valve disease is unclear (stenosis and regurg), and warrants further investigation in anticipation of MV intervention.   2. History of cardioembolic CVA  - on xarelto, would need to be off 2-3 days for invasive procedure if needed.  - will hold xarelto, last dose 08/07/13 around 5 pm, cover with lovenox.         Dina Rich, M.D., F.A.C.C.

## 2013-07-10 ENCOUNTER — Encounter (HOSPITAL_COMMUNITY)
Admission: EM | Disposition: A | Discharge status: Home / Self Care | Payer: Self-pay | Source: Home / Self Care | Attending: Cardiology

## 2013-07-10 ENCOUNTER — Ambulatory Visit (HOSPITAL_COMMUNITY): Admit: 2013-07-10 | Payer: Medicare Other | Admitting: Cardiovascular Disease

## 2013-07-10 DIAGNOSIS — I6350 Cerebral infarction due to unspecified occlusion or stenosis of unspecified cerebral artery: Secondary | ICD-10-CM

## 2013-07-10 DIAGNOSIS — I509 Heart failure, unspecified: Secondary | ICD-10-CM

## 2013-07-10 HISTORY — PX: LEFT AND RIGHT HEART CATHETERIZATION WITH CORONARY ANGIOGRAM: SHX5449

## 2013-07-10 LAB — BASIC METABOLIC PANEL
BUN: 19 mg/dL (ref 6–23)
Calcium: 8.9 mg/dL (ref 8.4–10.5)
GFR calc Af Amer: 61 mL/min — ABNORMAL LOW (ref >90)
GFR calc non Af Amer: 53 mL/min — ABNORMAL LOW (ref >90)
Glucose, Bld: 114 mg/dL — ABNORMAL HIGH (ref 70–99)
Potassium: 4.7 mEq/L (ref 3.5–5.1)
Sodium: 135 mEq/L (ref 135–145)

## 2013-07-10 LAB — CBC
MCH: 28.8 pg (ref 26.0–34.0)
MCHC: 32.8 g/dL (ref 30.0–36.0)
Platelets: 94 10*3/uL — ABNORMAL LOW (ref 150–400)

## 2013-07-10 LAB — POCT I-STAT 3, VENOUS BLOOD GAS (G3P V)
O2 Saturation: 58 %
pCO2, Ven: 55.3 mmHg — ABNORMAL HIGH (ref 45.0–50.0)
pO2, Ven: 34 mmHg (ref 30.0–45.0)

## 2013-07-10 LAB — POCT I-STAT 3, ART BLOOD GAS (G3+)
O2 Saturation: 94 %
TCO2: 29 mmol/L (ref 0–100)
pCO2 arterial: 48.6 mmHg — ABNORMAL HIGH (ref 35.0–45.0)

## 2013-07-10 SURGERY — LEFT AND RIGHT HEART CATHETERIZATION WITH CORONARY ANGIOGRAM
Anesthesia: LOCAL

## 2013-07-10 MED ORDER — SODIUM CHLORIDE 0.9 % IV SOLN
INTRAVENOUS | Status: AC
  Administered 2013-07-10 (×2): via INTRAVENOUS

## 2013-07-10 MED ORDER — SODIUM CHLORIDE 0.9 % IJ SOLN: 3.0000 mL | Freq: Two times a day (BID) | INTRAMUSCULAR | Status: DC

## 2013-07-10 MED ORDER — HEPARIN (PORCINE) IN NACL 100-0.45 UNIT/ML-% IJ SOLN
900.0000 [IU]/h | INTRAMUSCULAR | Status: DC
  Administered 2013-07-11: 900 [IU]/h via INTRAVENOUS
  Filled 2013-07-10 (×3): qty 250

## 2013-07-10 MED ORDER — LIDOCAINE HCL (PF) 1 % IJ SOLN
INTRAMUSCULAR | Status: AC
  Filled 2013-07-10: qty 30

## 2013-07-10 MED ORDER — HEPARIN (PORCINE) IN NACL 2-0.9 UNIT/ML-% IJ SOLN
INTRAMUSCULAR | Status: AC
  Filled 2013-07-10: qty 1000

## 2013-07-10 MED ORDER — SODIUM CHLORIDE 0.9 % IV SOLN: 1.0000 mL/kg/h | INTRAVENOUS | Status: DC

## 2013-07-10 MED ORDER — MIDAZOLAM HCL 2 MG/2ML IJ SOLN
INTRAMUSCULAR | Status: AC
  Filled 2013-07-10: qty 2

## 2013-07-10 MED ORDER — SODIUM CHLORIDE 0.9 % IV SOLN: 250.0000 mL | INTRAVENOUS | Status: DC | PRN

## 2013-07-10 MED ORDER — SODIUM CHLORIDE 0.9 % IJ SOLN: 3.0000 mL | INTRAMUSCULAR | Status: DC | PRN

## 2013-07-10 MED ORDER — ASPIRIN 81 MG PO CHEW
324.0000 mg | CHEWABLE_TABLET | ORAL | Status: AC
  Administered 2013-07-10: 324 mg via ORAL
  Filled 2013-07-10: qty 4

## 2013-07-10 MED ORDER — NITROGLYCERIN 0.2 MG/ML ON CALL CATH LAB
INTRAVENOUS | Status: AC
  Filled 2013-07-10: qty 1

## 2013-07-10 MED ORDER — FENTANYL CITRATE 0.05 MG/ML IJ SOLN
INTRAMUSCULAR | Status: AC
  Filled 2013-07-10: qty 2

## 2013-07-10 NOTE — Discharge Summary (Signed)
NAMEHETAL, PROANO               ACCOUNT NO.:  [id number]  MEDICAL RECORD NO.:  [id number]  LOCATION:                                 FACILITY:  PHYSICIAN:  Kingsley Callander. Ouida Sills, MD       DATE OF BIRTH:  1946-07-31  DATE OF ADMISSION:  07/06/2013 DATE OF DISCHARGE:  LH                              DISCHARGE SUMMARY   DISCHARGE DIAGNOSES: 1. Pulmonary edema. 2. Mitral stenosis. 3. Aortic stenosis. 4. Chronic kidney disease, stage 2. 5. Hypokalemia. 6. Type 2 diabetes. 7. Hypertension. 8. History of cardioembolic stroke in 2013. 9. History of carcinoma of the breast. 10.Depression. 11.Gastroparesis. 12.Gastroesophageal reflux disease. 13.Chronic cough.  DISCHARGE MEDICATIONS: 1. Lasix 40 mg IV twice a day. 2. Albuterol nebulizer treatments q.2 hours p.r.n. 3. Atorvastatin 20 mg daily. 4. Wellbutrin XL 150 mg daily. 5. Lovenox 75 mg subcu q.12. 6. Flovent 44 mcg 1 puff b.i.d. 7. Robitussin DM 2 teaspoons q.4 p.r.n. 8. Hycodan 1 teaspoon q.4 p.r.n. 9. Levsin SL 0.125 mg q.4 hours p.r.n. 10.Synthroid 112 mcg daily. 11.Losartan 50 mg daily. 12.Lopressor 25 mg b.i.d. 13.Protonix 40 mg daily. 14.Zofran 4 mg IV q.6 p.r.n. 15.Carafate suspension 1 g before meals and at bedtime.  HOSPITAL COURSE:  This patient is a 67 year old female with a history of rheumatic heart disease who presented with shortness of breath and dyspnea on exertion.  She was found to be in pulmonary edema.  She was treated with IV Lasix.  She had an elevated D-dimer, which was evaluated by a CT angiogram.  There was no evidence of pulmonary embolus. Pulmonary edema was evident.  She was evaluated by Cardiology with a followup echocardiogram.  She has severe mitral stenosis.  She has aortic stenosis, as well.  Tentative arrangements are being made for transfer to [facility] for further evaluation.  She has responded well to IV Lasix.  A repeat chest x-ray reveals resolution of pulmonary edema.  Xarelto has  been stopped.  Lovenox has been started.  Condition at discharge is much improved.  She had a mild hypokalemia of 3.3 on the 6th, which was corrected to 3.9 on the 7th.  She has stage II chronic kidney disease and has a creatinine of 1.18.  She has type 2 diabetes, which has been well controlled with lifestyle measures.  She has had a thrombocytopenia.  Her platelet count has improved from 84,000 to 93,000.     Kingsley Callander. Ouida Sills, MD     ROF/MEDQ  D:  07/09/2013  T:  07/10/2013  Job:  [id number]

## 2013-07-10 NOTE — Progress Notes (Signed)
07/09/13 Pt.is A/Ox4 and ambulates without assistance. She has had no c/o pain and no signs of distress during the shift. Patient's husband stayed at the bedside throughout the shift. Orders were placed for cardiac cath procedure on 07/10/13 after 0400. Had patient and husband to view cardiac cath/ PCI video and also gave patient the cardiac cath brochure. Pt.signed consent form and it was placed in patient's chart. EKG done this am and showed normal sinus rhythm. Also called provider on call for Dr. Wyline Mood, Ward Givens, NP and notified him of pt.'s order for lovenox 75mg  injection this am and asked about order, was told not to give dose. Informed oncoming RN of remaining cardiac cath orders this am.

## 2013-07-10 NOTE — Interval H&P Note (Signed)
History and Physical Interval Note:  07/10/2013 5:31 PM  [name]  has presented today for cardiac cath with the diagnosis of mitral stenosis. The various methods of treatment have been discussed with the patient and family. After consideration of risks, benefits and other options for treatment, the patient has consented to  Procedure(s): LEFT AND RIGHT HEART CATHETERIZATION WITH CORONARY ANGIOGRAM (N/A) as a surgical intervention .  The patient's history has been reviewed, patient examined, no change in status, stable for surgery.  I have reviewed the patient's chart and labs.  Questions were answered to the patient's satisfaction.     MCALHANY,CHRISTOPHER

## 2013-07-10 NOTE — Progress Notes (Signed)
ANTICOAGULATION CONSULT NOTE - Initial Consult  Pharmacy Consult for heparin Indication: severe mitral valve stenosis / hx of cariodembolic stroke   No Known Allergies  Patient Measurements: Height: 5\' 2"  (157.5 cm) Weight: 166 lb 0.1 oz (75.3 kg) IBW/kg (Calculated) : 50.1 Heparin Dosing Weight: 66 kg  Vital Signs: Temp: 97.5 F (36.4 C) (10/08 1911) Temp src: Oral (10/08 1911) BP: 130/52 mmHg (10/08 1911) Pulse Rate: 62 (10/08 1911)  Labs:  Recent Labs  07/08/13 0550 07/09/13 0547 07/10/13 0705  HGB 13.3 13.2 12.9  HCT 40.9 40.9 39.3  PLT 87* 93* 94*  CREATININE 1.03 1.18* 1.05    Estimated Creatinine Clearance: 48.1 ml/min (by C-G formula based on Cr of 1.05).   Medical History: Past Medical History  Diagnosis Date  . Rheumatic heart disease     mild AS/AI and mild MS/trivial MR; cardiac cath in 1993; chronic class II dyspnea on exertion  . Gastroesophageal reflux disease   . Hyperlipidemia   . Hypertension     pt denies 05/30/13  . Cardioembolic stroke 01/2012    Right frontal in 01/2012; normal carotid ultrasound; possible LAA thrombus by TEE; virtual complete neurologic recovery  . Fasting hyperglycemia     120 fasting  . Chronic kidney disease, stage 2, mildly decreased GFR     GFR of approximately 60  . Breast carcinoma 1995    1995  . Depression   . Diverticulosis of colon (without mention of hemorrhage) 2012    Dr. Karilyn Cota  . Gastroparesis   . Hemorrhoids   . Anemia     Medications:  Scheduled:  . atorvastatin  20 mg Oral Daily  . buPROPion  150 mg Oral Daily  . fluticasone  1 puff Inhalation BID  . furosemide  40 mg Oral Daily  . levothyroxine  112 mcg Oral QAC breakfast  . losartan  50 mg Oral Daily  . metoprolol tartrate  25 mg Oral BID  . pantoprazole  40 mg Oral Daily  . sodium chloride  3 mL Intravenous Q12H  . sucralfate  1 g Oral TID WC & HS   Infusions:  . sodium chloride 50 mL/hr at 07/10/13 1851    Assessment: 67 yo  female s/p cath will be started on heparin 8hrs post sheath removal for severe mitral valve stenosis / hx of cariodembolic stroke.  Sheath was removed at 1846 per cath lab report.  Patient was on xarelto previously but last dose was 07/06/13 Corrin Sieling most likely out of her system.     Goal of Therapy:  Heparin level 0.3-0.7 units/ml Monitor platelets by anticoagulation protocol: Yes   Plan:  1) Start heparin at 900 units/hr at 0245 on 07/11/13. No bolus 2) Check an 6hr heparin level after drip is started 3) Daily heparin level and CBC  Allante Beane, Tsz-Yin 07/10/2013,7:55 PM

## 2013-07-10 NOTE — Progress Notes (Signed)
No complaints of pain at this time.

## 2013-07-10 NOTE — CV Procedure (Signed)
      Cardiac Catheterization Operative Report  [name] [id number] 10/8/20146:04 PM Carylon Perches, MD  Procedure Performed:  1. Left Heart Catheterization 2. Selective Coronary Angiography 3. Right Heart Catheterization 4. Left ventricular pressures  Operator: Verne Carrow, MD  Indication:  67 yo female with history of rheumatic fever, mitral stenosis and aortic stenosis who was admitted with CHF. Right and left heart cath to assess pressures and exclude CAD.                                 Procedure Details: The risks, benefits, complications, treatment options, and expected outcomes were discussed with the patient. The patient and/or family concurred with the proposed plan, giving informed consent. The patient was brought to the cath lab after IV hydration was begun and oral premedication was given. The patient was further sedated with Versed and Fentanyl. The right groin was prepped and draped in the usual manner. Using the modified Seldinger access technique, a 5 French sheath was placed in the femoral artery. A 7 French sheath was inserted into the right femoral vein. A balloon tipped catheter was used to perform a right heart catheterization. Standard diagnostic catheters were used to perform selective coronary angiography. A pigtail catheter was used to perform a left ventricular angiogram. There were no immediate complications. The patient was taken to the recovery area in stable condition.   Hemodynamic Findings: Ao: 132/57                LV: 144/2/11 RA: 10         RV: 63/2/10 PA: 62/19 (mean 41)     PCWP:  19 Fick Cardiac Output: 3.73 L/min Fick Cardiac Index: 2.11 L/min/m2 Central Aortic Saturation: 94% Pulmonary Artery Saturation: 58%  Aortic valve: Peak to peak gradient 12 mm Hg. Valve easily crossed with J-wire.   Angiographic Findings:  Left main: No obstructive disease.   Left Anterior Descending Artery: Large caliber vessel that courses to the  apex. The mid and distal vessel becomes small in caliber. Small caliber diagonal branch. No obstructive disease.   Circumflex Artery: Large caliber vessel with two small obtuse marginal branches. No obstructive disease.   Right Coronary Artery: Large, dominant vessel with no obstructive disease.   Left Ventricular Angiogram: Deferred.   Impression: 1. No angiographic evidence of CAD 2. Elevated filling pressures  Recommendations: No further ischemic workup. Plans for TEE in am to better assess mitral and aortic valves.        Complications:  None; patient tolerated the procedure well.

## 2013-07-10 NOTE — Progress Notes (Signed)
NAMETAWONNA, ESQUER               ACCOUNT NO.:  [id number]  MEDICAL RECORD NO.:  [id number]  LOCATION:  4E29C                        FACILITY:  [facility]  PHYSICIAN:  Kingsley Callander. Ouida Sills, MD       DATE OF BIRTH:  1946-01-18  DATE OF PROCEDURE:  07/09/2013 DATE OF DISCHARGE:                                PROGRESS NOTE   HISTORY:  [name] is breathing comfortably this morning.  Her oxygen saturation is in the 90s on room air.  She had a repeat chest x-ray yesterday, revealing resolution of her pulmonary edema.  Urine output recording appears incomplete.  PHYSICAL EXAMINATION:  VITAL SIGNS:  Temperature 98.4, pulse 64, blood pressure 118/61, respirations 20. LUNGS:  Clear. HEART:  Regular with a grade 2 systolic murmur. EXTREMITIES:  No edema.  IMPRESSION/PLAN: 1. Pulmonary edema, resolved. 2. Mitral and aortic valve disease.  Appreciate Cardiology consult.     Echocardiogram reveals severe mitral stenosis.  Aortic valve cannot     be completely evaluated.  Tentative plan appears to be transfer to     [facility] for additional evaluation and possible valve surgery.     Xarelto has been discontinued.  She has been started on Lovenox. 3. Hypokalemia.  Serum potassium is normalized from 3.3 to 3.9. 4. Chronic kidney disease stage II.  Creatinine is 1.18, with a BUN of     18. 5. Thrombocytopenia, slightly improved to 93,000. 6. Diabetes.  Glucose this morning is 174.     Kingsley Callander. Ouida Sills, MD     ROF/MEDQ  D:  07/09/2013  T:  07/10/2013  Job:  [id number]

## 2013-07-10 NOTE — H&P (View-Only) (Signed)
    SUBJECTIVE:  Breathing much better.  No chest pain.  No SOB   PHYSICAL EXAM Filed Vitals:   07/09/13 1106 07/09/13 1351 07/09/13 2013 07/10/13 0556  BP: 134/63 127/61 139/72 137/75  Pulse: 70 60 69 65  Temp: 98.7 F (37.1 C) 97.6 F (36.4 C) 98.3 F (36.8 C) 98.3 F (36.8 C)  TempSrc: Oral Oral Oral Oral  Resp: 20 18 18 20   Height:  5\' 2"  (1.575 m)    Weight:  168 lb 6.9 oz (76.4 kg)  166 lb 0.1 oz (75.3 kg)  SpO2: 99% 99% 98% 95%   General:  No distress Lungs:  Clear Heart:  RRR Abdomen:  Positive bowel sounds, no rebound no guarding Extremities:  No edema  LABS:  No results found for this or any previous visit (from the past 24 hour(s)).  Intake/Output Summary (Last 24 hours) at 07/10/13 0740 Last data filed at 07/09/13 2148  Gross per 24 hour  Intake    963 ml  Output    200 ml  Net    763 ml    EKG:    ASSESSMENT AND PLAN:  Acute respiratory failure with hypoxia:  Pulmonary edema.  Improved.  Evaluation as below.   Cardioembolic stroke:  Holding Xarelto.  I would bridge with heparin after the procedure until we decide that no further invasive evaluation is needed.    Rheumatic heart disease/Severe mitral valve stenosis:  History of closed "finger fracture" valvulotomy 1962.  TEE with moderate to severe stenosis.  She will need right and left cath and TEE to assess the severity.  Cath today. The patient understands that risks included but are not limited to stroke (1 in 1000), death (1 in 1000), kidney failure [usually temporary] (1 in 500), bleeding (1 in 200), allergic reaction [possibly serious] (1 in 200).  The patient understands and agrees to proceed.     Fayrene Fearing [facility] 07/10/2013 7:40 AM

## 2013-07-10 NOTE — Progress Notes (Signed)
    SUBJECTIVE:  Breathing much better.  No chest pain.  No SOB   PHYSICAL EXAM Filed Vitals:   07/09/13 1106 07/09/13 1351 07/09/13 2013 07/10/13 0556  BP: 134/63 127/61 139/72 137/75  Pulse: 70 60 69 65  Temp: 98.7 F (37.1 C) 97.6 F (36.4 C) 98.3 F (36.8 C) 98.3 F (36.8 C)  TempSrc: Oral Oral Oral Oral  Resp: 20 18 18 20  Height:  5' 2" (1.575 m)    Weight:  168 lb 6.9 oz (76.4 kg)  166 lb 0.1 oz (75.3 kg)  SpO2: 99% 99% 98% 95%   General:  No distress Lungs:  Clear Heart:  RRR Abdomen:  Positive bowel sounds, no rebound no guarding Extremities:  No edema  LABS:  No results found for this or any previous visit (from the past 24 hour(s)).  Intake/Output Summary (Last 24 hours) at 07/10/13 0740 Last data filed at 07/09/13 2148  Gross per 24 hour  Intake    963 ml  Output    200 ml  Net    763 ml    EKG:    ASSESSMENT AND PLAN:  Acute respiratory failure with hypoxia:  Pulmonary edema.  Improved.  Evaluation as below.   Cardioembolic stroke:  Holding Xarelto.  I would bridge with heparin after the procedure until we decide that no further invasive evaluation is needed.    Rheumatic heart disease/Severe mitral valve stenosis:  History of closed "finger fracture" valvulotomy 1962.  TEE with moderate to severe stenosis.  She will need right and left cath and TEE to assess the severity.  Cath today. The patient understands that risks included but are not limited to stroke (1 in 1000), death (1 in 1000), kidney failure [usually temporary] (1 in 500), bleeding (1 in 200), allergic reaction [possibly serious] (1 in 200).  The patient understands and agrees to proceed.     Choua Ikner 07/10/2013 7:40 AM   

## 2013-07-11 ENCOUNTER — Encounter (HOSPITAL_COMMUNITY): Payer: Self-pay

## 2013-07-11 ENCOUNTER — Encounter (HOSPITAL_COMMUNITY)
Admission: EM | Disposition: A | Discharge status: Home / Self Care | Payer: Self-pay | Source: Home / Self Care | Attending: Cardiology

## 2013-07-11 DIAGNOSIS — I059 Rheumatic mitral valve disease, unspecified: Secondary | ICD-10-CM

## 2013-07-11 DIAGNOSIS — I359 Nonrheumatic aortic valve disorder, unspecified: Secondary | ICD-10-CM

## 2013-07-11 HISTORY — PX: TEE WITHOUT CARDIOVERSION: SHX5443

## 2013-07-11 LAB — HEPARIN LEVEL (UNFRACTIONATED): Heparin Unfractionated: 0.31 IU/mL (ref 0.30–0.70)

## 2013-07-11 LAB — CBC
HCT: 39.5 % (ref 36.0–46.0)
Hemoglobin: 13.4 g/dL (ref 12.0–15.0)
RDW: 14.1 % (ref 11.5–15.5)
WBC: 9 10*3/uL (ref 4.0–10.5)

## 2013-07-11 LAB — GLUCOSE, CAPILLARY: Glucose-Capillary: 185 mg/dL — ABNORMAL HIGH (ref 70–99)

## 2013-07-11 SURGERY — ECHOCARDIOGRAM, TRANSESOPHAGEAL
Anesthesia: Moderate Sedation

## 2013-07-11 MED ORDER — FENTANYL CITRATE 0.05 MG/ML IJ SOLN
INTRAMUSCULAR | Status: AC
  Filled 2013-07-11: qty 2

## 2013-07-11 MED ORDER — BUTAMBEN-TETRACAINE-BENZOCAINE 2-2-14 % EX AERO
INHALATION_SPRAY | CUTANEOUS | Status: DC | PRN
  Administered 2013-07-11: 2 via TOPICAL

## 2013-07-11 MED ORDER — SODIUM CHLORIDE 0.9 % IV SOLN: INTRAVENOUS | Status: DC

## 2013-07-11 MED ORDER — MIDAZOLAM HCL 10 MG/2ML IJ SOLN
INTRAMUSCULAR | Status: DC | PRN
  Administered 2013-07-11: 1 mg via INTRAVENOUS
  Administered 2013-07-11: 2 mg via INTRAVENOUS

## 2013-07-11 MED ORDER — MIDAZOLAM HCL 5 MG/ML IJ SOLN
INTRAMUSCULAR | Status: AC
  Filled 2013-07-11: qty 2

## 2013-07-11 MED ORDER — FENTANYL CITRATE 0.05 MG/ML IJ SOLN
INTRAMUSCULAR | Status: DC | PRN
  Administered 2013-07-11: 25 ug via INTRAVENOUS

## 2013-07-11 NOTE — Consult Note (Signed)
[address]       [name] [address]             [phone]        Reason for Consult: moderate to severe mitral stenosis, moderate AS, mild-mod AI Referring Physician:  Dr. Charmian Muff is an 67 y.o. female.  HPI:   She has a history of presumed rheumatic valve disease status post digital mitral commisurotomy via left thoracotomy incision at age 65. She reports a couple year history of shortness of breath with significant exertion such as going up stairs. She presented with a three-week history of worsening shortness of breath with exertion and lower extremity edema. She said that she has recently been getting markedly short of breath walking on level ground. She's also had some chest discomfort associated with the shortness of breath. She was seen in the emergency room and found to be in pulmonary edema. A CT scan of the chest was negative for pulmonary embolism but did show some pulmonary edema. She improved with diuresis. She underwent cardiac catheterization and TEE as noted below.  Past Medical History  Diagnosis Date  . Rheumatic heart disease     mild AS/AI and mild MS/trivial MR; cardiac cath in 1993; chronic class II dyspnea on exertion  . Gastroesophageal reflux disease   . Hyperlipidemia   . Hypertension     pt denies 05/30/13  . Cardioembolic stroke 01/2012    Right frontal in 01/2012; normal carotid ultrasound; possible LAA thrombus by TEE; virtual complete neurologic recovery  . Fasting hyperglycemia     120 fasting  . Chronic kidney disease, stage 2, mildly decreased GFR     GFR of approximately 60  . Breast carcinoma 1995    1995  . Depression   . Diverticulosis of colon (without mention of hemorrhage) 2012    Dr. Karilyn Cota  . Gastroparesis   . Hemorrhoids   . Anemia     Past Surgical History  Procedure Laterality Date  . Mitral valve surgery  [facility], closed mitral valvulotomy by finger fracture  . Cardiac  catheterization    . Dilation and curettage of uterus    . Breast lumpectomy Right 1995  . Tubal ligation  1973  . Tee without cardioversion  01/24/2012    Procedure: TRANSESOPHAGEAL ECHOCARDIOGRAM (TEE);  Surgeon: Lewayne Bunting, MD;  Location: [facility] ENDOSCOPY;  Service: Cardiovascular;  Laterality: N/A;  . Colonoscopy  2012    Negative screening procedure  . Esophageal manometry N/A 06/17/2013    Procedure: ESOPHAGEAL MANOMETRY (EM);  Surgeon: Mardella Layman, MD;  Location: [facility] ENDOSCOPY;  Service: Endoscopy;  Laterality: N/A;    Family History  Problem Relation Age of Onset  . Heart disease Brother 50    MI  . Rheum arthritis Maternal Grandmother   . Asthma Maternal Grandfather   . Hypothyroidism Mother   . Diabetes Sister   . Cirrhosis Father   . Cervical cancer Sister     malignant neoplasm of cervix uteri    Social History:  reports that she has never smoked. She has never used smokeless tobacco. She reports that she does not drink alcohol or use illicit drugs.  Allergies: No Known Allergies  Medications:  I have reviewed the patient's current medications. Prior to Admission:  Prescriptions prior to admission  Medication Sig Dispense Refill  . albuterol (VENTOLIN HFA) 108 (90 BASE) MCG/ACT inhaler Inhale 2 puffs into the  lungs every 4 (four) hours as needed for wheezing.      Marland Kitchen atorvastatin (LIPITOR) 10 MG tablet Take 20 mg by mouth daily.      . beclomethasone (QVAR) 80 MCG/ACT inhaler Inhale 2 puffs into the lungs 2 (two) times daily.  1 Inhaler  6  . buPROPion (WELLBUTRIN XL) 150 MG 24 hr tablet Take 150 mg by mouth daily.        Marland Kitchen dexlansoprazole (DEXILANT) 60 MG capsule Take 1 capsule (60 mg total) by mouth daily.  90 capsule  3  . hyoscyamine (LEVSIN SL) 0.125 MG SL tablet Place 1 tablet (0.125 mg total) under the tongue every 4 (four) hours as needed for cramping.  30 tablet  0  . levothyroxine (SYNTHROID, LEVOTHROID) 112 MCG tablet Take 112 mcg by mouth daily  before breakfast.      . losartan (COZAAR) 100 MG tablet Take 50 mg by mouth daily.        . Multiple Vitamin (MULITIVITAMIN WITH MINERALS) TABS Take 1 tablet by mouth daily.      . Rivaroxaban (XARELTO) 20 MG TABS Take 1 tablet (20 mg total) by mouth daily with supper.  30 tablet  6  . saccharomyces boulardii (FLORASTOR) 250 MG capsule Take 250 mg by mouth daily.      . sucralfate (CARAFATE) 1 GM/10ML suspension Can take every 4 hours as needed  420 mL  2   Scheduled: . atorvastatin  20 mg Oral Daily  . buPROPion  150 mg Oral Daily  . fluticasone  1 puff Inhalation BID  . furosemide  40 mg Oral Daily  . levothyroxine  112 mcg Oral QAC breakfast  . losartan  50 mg Oral Daily  . metoprolol tartrate  25 mg Oral BID  . pantoprazole  40 mg Oral Daily  . sodium chloride  3 mL Intravenous Q12H  . sucralfate  1 g Oral TID WC & HS   Continuous: . heparin 900 Units/hr (07/11/13 0245)   ZOX:WRUEAV chloride, acetaminophen, albuterol, guaiFENesin-dextromethorphan, HYDROcodone-homatropine, hyoscyamine, ondansetron (ZOFRAN) IV, sodium chloride Anti-infectives   None      Results for orders placed during the hospital encounter of 07/06/13 (from the past 48 hour(s))  BASIC METABOLIC PANEL     Status: Abnormal   Collection Time    07/10/13  7:05 AM      Result Value Range   Sodium 135  135 - 145 mEq/L   Potassium 4.7  3.5 - 5.1 mEq/L   Comment: HEMOLYSIS AT THIS LEVEL MAY AFFECT RESULT   Chloride 100  96 - 112 mEq/L   CO2 25  19 - 32 mEq/L   Glucose, Bld 114 (*) 70 - 99 mg/dL   BUN 19  6 - 23 mg/dL   Creatinine, Ser 4.09  0.50 - 1.10 mg/dL   Calcium 8.9  8.4 - 81.1 mg/dL   GFR calc non Af Amer 53 (*) >90 mL/min   GFR calc Af Amer 61 (*) >90 mL/min   Comment: (NOTE)     The eGFR has been calculated using the CKD EPI equation.     This calculation has not been validated in all clinical situations.     eGFR's persistently <90 mL/min signify possible Chronic Kidney     Disease.  CBC      Status: Abnormal   Collection Time    07/10/13  7:05 AM      Result Value Range   WBC 7.1  4.0 - 10.5 K/uL  RBC 4.48  3.87 - 5.11 MIL/uL   Hemoglobin 12.9  12.0 - 15.0 g/dL   HCT 98.1  19.1 - 47.8 %   MCV 87.7  78.0 - 100.0 fL   MCH 28.8  26.0 - 34.0 pg   MCHC 32.8  30.0 - 36.0 g/dL   RDW 29.5  62.1 - 30.8 %   Platelets 94 (*) 150 - 400 K/uL   Comment: REPEATED TO VERIFY     PLATELET COUNT CONFIRMED BY SMEAR  POCT I-STAT 3, BLOOD GAS (G3+)     Status: Abnormal   Collection Time    07/10/13  5:50 PM      Result Value Range   pH, Arterial 7.354  7.350 - 7.450   pCO2 arterial 48.6 (*) 35.0 - 45.0 mmHg   pO2, Arterial 75.0 (*) 80.0 - 100.0 mmHg   Bicarbonate 27.1 (*) 20.0 - 24.0 mEq/L   TCO2 29  0 - 100 mmol/L   O2 Saturation 94.0     Acid-Base Excess 1.0  0.0 - 2.0 mmol/L   Sample type ARTERIAL    POCT I-STAT 3, BLOOD GAS (G3P V)     Status: Abnormal   Collection Time    07/10/13  5:53 PM      Result Value Range   pH, Ven 7.300  7.250 - 7.300   pCO2, Ven 55.3 (*) 45.0 - 50.0 mmHg   pO2, Ven 34.0  30.0 - 45.0 mmHg   Bicarbonate 27.2 (*) 20.0 - 24.0 mEq/L   TCO2 29  0 - 100 mmol/L   O2 Saturation 58.0     Sample type VENOUS     Comment NOTIFIED PHYSICIAN    CBC     Status: Abnormal   Collection Time    07/11/13  9:50 AM      Result Value Range   WBC 9.0  4.0 - 10.5 K/uL   RBC 4.56  3.87 - 5.11 MIL/uL   Hemoglobin 13.4  12.0 - 15.0 g/dL   HCT 65.7  84.6 - 96.2 %   MCV 86.6  78.0 - 100.0 fL   MCH 29.4  26.0 - 34.0 pg   MCHC 33.9  30.0 - 36.0 g/dL   RDW 95.2  84.1 - 32.4 %   Platelets 84 (*) 150 - 400 K/uL   Comment: CONSISTENT WITH PREVIOUS RESULT  HEPARIN LEVEL (UNFRACTIONATED)     Status: None   Collection Time    07/11/13  3:59 PM      Result Value Range   Heparin Unfractionated 0.31  0.30 - 0.70 IU/mL   Comment:            IF HEPARIN RESULTS ARE BELOW     EXPECTED VALUES, AND PATIENT     DOSAGE HAS BEEN CONFIRMED,     SUGGEST FOLLOW UP TESTING     OF  ANTITHROMBIN III LEVELS.    No results found.  Review of Systems  Constitutional: Negative for fever, chills, weight loss and malaise/fatigue.  HENT: Negative.   Eyes: Negative.   Respiratory: Positive for cough and shortness of breath. Negative for hemoptysis and sputum production.        With minimal exertion  Cardiovascular: Positive for chest pain and leg swelling. Negative for orthopnea and PND.  Gastrointestinal: Negative.   Genitourinary: Negative.   Musculoskeletal: Negative.   Skin: Negative.   Neurological: Negative.        Short term memory loss after prior stroke.  Endo/Heme/Allergies: Negative.  Psychiatric/Behavioral: Negative.    Blood pressure 124/50, pulse 66, temperature 98 F (36.7 C), temperature source Oral, resp. rate 18, height 5\' 2"  (1.575 m), weight 74.934 kg (165 lb 3.2 oz), SpO2 97.00%. Physical Exam  Constitutional: She is oriented to person, place, and time. She appears well-developed and well-nourished. No distress.  HENT:  Head: Normocephalic and atraumatic.  Mouth/Throat: Oropharynx is clear and moist.  Eyes: EOM are normal. Pupils are equal, round, and reactive to light.  Neck: Normal range of motion. Neck supple. No JVD present. No thyromegaly present.  Murmur or bruit heard on both sides of neck  Cardiovascular: Normal rate and regular rhythm.   Murmur heard. 3/6 systolic murmur over aorta  Respiratory: Effort normal and breath sounds normal. No respiratory distress. She has no rales.  Old left thoracotomy scar  GI: Soft. Bowel sounds are normal. She exhibits no distension and no mass. There is no tenderness.  Musculoskeletal: Normal range of motion. She exhibits no edema and no tenderness.  Lymphadenopathy:    She has no cervical adenopathy.  Neurological: She is alert and oriented to person, place, and time. She has normal strength. No cranial nerve deficit or sensory deficit.  Skin: Skin is warm and dry.  Psychiatric: She has a normal  mood and affect.   Cardiac Catheterization Operative Report  [name]  [id number]  10/8/20146:04 PM  Carylon Perches, MD  Procedure Performed:  1. Left Heart Catheterization 2. Selective Coronary Angiography 3. Right Heart Catheterization 4. Left ventricular pressures Operator: Verne Carrow, MD  Indication: 67 yo female with history of rheumatic fever, mitral stenosis and aortic stenosis who was admitted with CHF. Right and left heart cath to assess pressures and exclude CAD.  Procedure Details:  The risks, benefits, complications, treatment options, and expected outcomes were discussed with the patient. The patient and/or family concurred with the proposed plan, giving informed consent. The patient was brought to the cath lab after IV hydration was begun and oral premedication was given. The patient was further sedated with Versed and Fentanyl. The right groin was prepped and draped in the usual manner. Using the modified Seldinger access technique, a 5 French sheath was placed in the femoral artery. A 7 French sheath was inserted into the right femoral vein. A balloon tipped catheter was used to perform a right heart catheterization. Standard diagnostic catheters were used to perform selective coronary angiography. A pigtail catheter was used to perform a left ventricular angiogram. There were no immediate complications. The patient was taken to the recovery area in stable condition.  Hemodynamic Findings:  Ao: 132/57  LV: 144/2/11  RA: 10  RV: 63/2/10  PA: 62/19 (mean 41)  PCWP: 19  Fick Cardiac Output: 3.73 L/min  Fick Cardiac Index: 2.11 L/min/m2  Central Aortic Saturation: 94%  Pulmonary Artery Saturation: 58%  Aortic valve: Peak to peak gradient 12 mm Hg. Valve easily crossed with J-wire.  Angiographic Findings:  Left main: No obstructive disease.  Left Anterior Descending Artery: Large caliber vessel that courses to the apex. The mid and distal vessel becomes small in  caliber. Small caliber diagonal branch. No obstructive disease.  Circumflex Artery: Large caliber vessel with two small obtuse marginal branches. No obstructive disease.  Right Coronary Artery: Large, dominant vessel with no obstructive disease.  Left Ventricular Angiogram: Deferred.  Impression:  1. No angiographic evidence of CAD  2. Elevated filling pressures  Recommendations: No further ischemic workup. Plans for TEE in am to better assess mitral and aortic  valves.  Complications: None; patient tolerated the procedure well.    Transesophageal Echocardiogram Note  LAURANN MCMORRIS  [id number]  [birth year]  Procedure: Transesophageal Echocardiogram  Indications: mitral stenosis  Procedure Details  Consent: Obtained  Time Out: Verified patient identification, verified procedure, site/side was marked, verified correct patient position, special equipment/implants available, Radiology Safety Procedures followed, medications/allergies/relevent history reviewed, required imaging and test results available. Performed  Medications:  Fentanyl: 25 mcg IV  Versed: 3 mg IV  Left Ventrical: Moderate LV dysfunction - EF 35-40%  Mitral Valve: markedly thickened, moderate - severe mitral stenosis,  Aortic Valve: moderately thickened, Moderately limited leaflet mobility. Mild - mod AI, moderate AS, Could not get a very good gradient  Tricuspid Valve: normal  Pulmonic Valve: normal  Left Atrium/ Left atrial appendage: LAA appears to be surgically obliterated . There was significant spontanous contrast in the LA.  Atrial septum: intact , no right to left left shunt by color or bubble study  Aorta: mild calcification  Complications: No apparent complications  Patient did tolerate procedure well.  Vesta Mixer, Montez Hageman., MD, [facility]  07/11/2013, 12:51 PM    Assessment/Plan:  She has moderate to severe mitral stenosis with moderate aortic stenosis and mild to moderate aortic insufficiency with progressive  exertional dyspnea and pulmonary edema. Her aortic valve leaflets are thickened with decreased mobility and I think if she is going to have mitral valve replacement her aortic valve should be replaced at the same time. I would recommend using tissue valves given her age of 67. She said that she had been on Coumadin in the past but it was difficult to control her INR. She is currently on Xarelto following an embolic stroke. I discussed the pros and cons of mechanical and tissue valves and she is in agreement with using tissue valves. I told her that I would not be able to perform surgery until late next week due to a full operative schedule. I think she could go home on Xarelto and return for surgery next Thursday. The Xarelto would have to be stopped 3 days prior to surgery. I discussed the operative procedure with the patient and family including alternatives, benefits and risks; including but not limited to bleeding, blood transfusion, infection, stroke, myocardial infarction, graft failure, heart block requiring a permanent pacemaker, organ dysfunction, and death.  [name] understands and agrees to proceed.  We will schedule surgery for next Thursday. If she goes home tomorrow she could take the Xarelto tomorrow and Sunday and stop it after Sunday.  Elyce Zollinger K 07/11/2013, 9:02 PM

## 2013-07-11 NOTE — CV Procedure (Addendum)
    Transesophageal Echocardiogram Note  [name] [id number] [birth year]  Procedure: Transesophageal Echocardiogram Indications: mitral stenosis  Procedure Details Consent: Obtained Time Out: Verified patient identification, verified procedure, site/side was marked, verified correct patient position, special equipment/implants available, Radiology Safety Procedures followed,  medications/allergies/relevent history reviewed, required imaging and test results available.  Performed  Medications: Fentanyl: 25 mcg IV Versed: 3 mg IV  Left Ventrical:  Moderate LV dysfunction - EF 35-40%  Mitral Valve: markedly thickened, moderate - severe mitral stenosis,  Aortic Valve: moderately thickened,  Moderately limited leaflet mobility.   Mild - mod AI, moderate AS,  Could not get a very good gradient  Tricuspid Valve: normal  Pulmonic Valve: normal  Left Atrium/ Left atrial appendage: LAA appears to be surgically obliterated .  There was significant spontanous contrast in the LA.   Atrial septum: intact , no right to left left shunt by color or bubble study  Aorta: mild calcification   Complications: No apparent complications Patient did tolerate procedure well.   Vesta Mixer, Montez Hageman., MD, [facility] 07/11/2013, 12:51 PM

## 2013-07-11 NOTE — Progress Notes (Signed)
TEE to assess her valves set up for noon, orders written.

## 2013-07-11 NOTE — Progress Notes (Signed)
  Echocardiogram Echocardiogram Transesophageal has been performed.  Allanah Mcfarland 07/11/2013, 1:20 PM

## 2013-07-11 NOTE — H&P (View-Only) (Signed)
Primary cardiologist: Consulting cardiologist: Dina Rich MD  CC: Shortness of breath  Clinical Summary Ms. Bahner is a 67 y.o.female with history of rheumatic valvular disease (Aortic valve and mitral valve), HL, HTN, cardioembolic CVA on xarelto, CKD stage 2 admitted with SOB.  She reports a 3 week history of worsening DOE and LE edema. She chronically sleeps on a wedge at home due to a history of gastroparesis per her report. She describes some chest discomfort after meals, but no exertional chest pain. She reports she has had a chronic cough for years with extensive workup with no clear indication.  No palpitations, no n/v,no diaphoresis.  In the ER she was found to be in pulmonary edema, she responded to aggressive diuresis. CT PE was negativeThis morning her SOB is significantly improved.     No Known Allergies  Medications Scheduled Medications: . atorvastatin  20 mg Oral Daily  . buPROPion  150 mg Oral Daily  . fluticasone  1 puff Inhalation BID  . furosemide  40 mg Intravenous BID  . levothyroxine  112 mcg Oral QAC breakfast  . losartan  50 mg Oral Daily  . pantoprazole  40 mg Oral Daily  . potassium chloride  20 mEq Oral TID  . rivaroxaban  20 mg Oral Q supper  . sodium chloride  3 mL Intravenous Q12H  . sucralfate  1 g Oral TID WC & HS     Infusions:     PRN Medications:  sodium chloride, acetaminophen, albuterol, guaiFENesin-dextromethorphan, hyoscyamine, ondansetron (ZOFRAN) IV, sodium chloride   Past Medical History  Diagnosis Date  . Rheumatic heart disease     mild AS/AI and mild MS/trivial MR; cardiac cath in 1993; chronic class II dyspnea on exertion  . Gastroesophageal reflux disease   . Hyperlipidemia   . Hypertension     pt denies 05/30/13  . Cardioembolic stroke 01/2012    Right frontal in 01/2012; normal carotid ultrasound; possible LAA thrombus by TEE; virtual complete neurologic recovery  . Fasting hyperglycemia     120 fasting  .  Chronic kidney disease, stage 2, mildly decreased GFR     GFR of approximately 60  . Breast carcinoma 1995    1995  . Depression   . Diverticulosis of colon (without mention of hemorrhage) 2012    Dr. Karilyn Cota  . Gastroparesis   . Hemorrhoids   . Anemia     Past Surgical History  Procedure Laterality Date  . Mitral valve surgery  [facility], closed mitral valvulotomy by finger fracture  . Cardiac catheterization    . Dilation and curettage of uterus    . Breast lumpectomy Right 1995  . Tubal ligation  1973  . Tee without cardioversion  01/24/2012    Procedure: TRANSESOPHAGEAL ECHOCARDIOGRAM (TEE);  Surgeon: Lewayne Bunting, MD;  Location: [facility] ENDOSCOPY;  Service: Cardiovascular;  Laterality: N/A;  . Colonoscopy  2012    Negative screening procedure  . Esophageal manometry N/A 06/17/2013    Procedure: ESOPHAGEAL MANOMETRY (EM);  Surgeon: Mardella Layman, MD;  Location: [facility] ENDOSCOPY;  Service: Endoscopy;  Laterality: N/A;    Family History  Problem Relation Age of Onset  . Heart disease Brother 50    MI  . Rheum arthritis Maternal Grandmother   . Asthma Maternal Grandfather   . Hypothyroidism Mother   . Diabetes Sister   . Cirrhosis Father   . Cervical cancer Sister     malignant neoplasm of cervix uteri  Social History Ms. Stricker reports that she has never smoked. She has never used smokeless tobacco. Ms. Obi reports that she does not drink alcohol.  Review of Systems CONSTITUTIONAL: No weight loss, fever, chills, weakness or fatigue.   HEENT: Eyes: No visual loss, blurred vision, double vision or yellow sclerae. No hearing loss, sneezing, congestion, runny nose or sore throat.   SKIN: No rash or itching.   CARDIOVASCULAR:Per HPI  RESPIRATORY: Per HPI  GASTROINTESTINAL: No anorexia, nausea, vomiting or diarrhea. No abdominal pain or blood.   GENITOURINARY: no polyuria, no dysuria  NEUROLOGICAL: No headache, dizziness, syncope, paralysis, ataxia,  numbness or tingling in the extremities. No change in bowel or bladder control.   MUSCULOSKELETAL: No muscle, back pain, joint pain or stiffness.   HEMATOLOGIC: No anemia, bleeding or bruising.   LYMPHATICS: No enlarged nodes. No history of splenectomy.   PSYCHIATRIC: No history of depression or anxiety.      Physical Examination Blood pressure 147/72, pulse 65, temperature 98.1 F (36.7 C), temperature source Oral, resp. rate 20, height 5\' 2"  (1.575 m), weight 168 lb 11.2 oz (76.522 kg), SpO2 96.00%.  Intake/Output Summary (Last 24 hours) at 07/08/13 0954 Last data filed at 07/08/13 0500  Gross per 24 hour  Intake    960 ml  Output    600 ml  Net    360 ml    HEENT: sclera clear, pupils equal round and reactive  Cardiovascular: RRR, 3/6 mid to late peaking systolic murmur RUSB, faint diastolic murmur at the apex, no JVD  Respiratory: CTAB  GI: abdomen soft, NT, ND  MSK: extremities warm, no edema  Neuro: no focal deficits  Psych: appropriate affect   Lab Results  Basic Metabolic Panel:  Recent Labs Lab 07/07/13 07/08/13 0550  NA 135 136  K 3.5 3.3*  CL 99 99  CO2 24 26  GLUCOSE 158* 124*  BUN 11 13  CREATININE 0.94 1.03  CALCIUM 9.5 9.0    Liver Function Tests:  Recent Labs Lab 07/07/13  AST 26  ALT 25  ALKPHOS 77  BILITOT 0.4  PROT 7.4  ALBUMIN 3.6    CBC:  Recent Labs Lab 07/07/13 07/08/13 0550  WBC 8.5 7.9  NEUTROABS 6.0  --   HGB 13.5 13.3  HCT 41.5 40.9  MCV 89.8 89.5  PLT 84* 87*    Cardiac Enzymes:  Recent Labs Lab 07/07/13 07/07/13 0458 07/07/13 1050 07/07/13 1726  TROPONINI <0.30 <0.30 <0.30 <0.30    BNP: No components found with this basename: POCBNP,    ECG:    Imaging 07/07/13 CXR FINDINGS:  Moderate enlargement of the cardiomediastinal silhouette is noted.  Center vascular congestion is present with a few Kerley B-lines  peripherally and trace effusions. Lung volumes are low, with  elevation of the  right hemidiaphragm and probable superimposed right  greater than left lower lobe atelectasis. Right axillary clips are  noted with evidence of right lumpectomy.  IMPRESSION:  Moderate cardiomegaly with findings suggestive of interstitial  pulmonary edema. Followup after diuresis may be helpful if there is  strong clinical suspicion for pneumonia which could be masked.   07/07/13 CT chest CT ANGIOGRAPHY CHEST  Technique: Multidetector CT imaging of the chest using the  standard protocol during bolus administration of intravenous  contrast. Multiplanar reconstructed images including MIPs were  obtained and reviewed to evaluate the vascular anatomy.  Contrast: OMNIPAQUE IOHEXOL 350 MG/ML SOLN  Comparison: Chest radiograph performed in the today at 12:22 a.m.,  and CT of the chest performed 02/19/2009  Findings: There is no evidence of pulmonary embolus.  A moderate right-sided pleural effusion is noted, and a small  loculated left-sided pleural effusion is seen. There is diffuse  interstitial prominence, likely reflecting mild pulmonary edema.  There is no evidence of pneumothorax. No masses are identified; no  abnormal focal contrast enhancement is seen.  The mediastinum is unremarkable in appearance. No mediastinal  lymphadenopathy is seen. Visualized mediastinal nodes remain  normal in size. No pericardial effusion is identified. The great  vessels are grossly unremarkable in appearance. No axillary  lymphadenopathy is seen. The visualized portions of the thyroid  gland are unremarkable in appearance.  The visualized portions of the liver and spleen are unremarkable.  No acute osseous abnormalities are seen.  IMPRESSION:  1. No evidence of pulmonary embolus.  2. Moderate right-sided pleural effusion and small loculated left-  sided pleural effusion; diffuse interstitial prominence likely  reflects pulmonary edema.   4/13 TEE LVEF 55-60%, no WMA, mild to moderate MS with  mean grad 8, mild MR, valve area 1.85, mod LAE, spontaneous echo contrast in LA,    07/2012 TTE: LVEF 55-60%, no WMA, mild to mod AS (mean grad 19 mmHg, indexed area 0.39 reported, VTI LVOT to AVI 0.18), mild AI, mild to moderate MS (valve area 1.34, mean grad 6)   Assessment/Plan  1. Pulmonary edema - likely related to progression of her MV disease, last evaluation a year ago showed at least moderate MS. She also of note has significant aortic stenosis (low gradient, severe by area) which is difficult to accurately assess in the setting of her significant MS - will start beta blocker in setting of mitral stenosis - continue diuretic - will follow up results of echo today - pending results, she may need further evaluation by CT surgery, with consideration for possible single or double valve replacement. Balloon valvuloplasty would likely only exacerbate her AV lesion. Pending further information, she may warrant a transfer to [facility] to begin that process.   2. History of cardioembolic CVA - on xarelto, would need to be off 2-3 days for invasive procedure if needed. - will hold xarelto, last dose 08/07/13 around 5 pm, cover with lovenox.         Dina Rich, M.D., F.A.C.C.

## 2013-07-11 NOTE — Progress Notes (Signed)
ANTICOAGULATION CONSULT NOTE -Follow up  Pharmacy Consult for heparin Indication: severe mitral valve stenosis / hx of cariodembolic stroke   No Known Allergies  Patient Measurements: Height: 5\' 2"  (157.5 cm) Weight: 165 lb 3.2 oz (74.934 kg) (scale b) IBW/kg (Calculated) : 50.1 Heparin Dosing Weight: 66 kg  Vital Signs: Temp: 98 F (36.7 C) (10/09 1357) Temp src: Oral (10/09 1357) BP: 124/50 mmHg (10/09 1357) Pulse Rate: 66 (10/09 1357)  Labs:  Recent Labs  07/09/13 0547 07/10/13 0705 07/11/13 0950 07/11/13 1559  HGB 13.2 12.9 13.4  --   HCT 40.9 39.3 39.5  --   PLT 93* 94* 84*  --   HEPARINUNFRC  --   --   --  0.31  CREATININE 1.18* 1.05  --   --     Estimated Creatinine Clearance: 47.9 ml/min (by C-G formula based on Cr of 1.05).   Medical History: Past Medical History  Diagnosis Date  . Rheumatic heart disease     mild AS/AI and mild MS/trivial MR; cardiac cath in 1993; chronic class II dyspnea on exertion  . Gastroesophageal reflux disease   . Hyperlipidemia   . Hypertension     pt denies 05/30/13  . Cardioembolic stroke 01/2012    Right frontal in 01/2012; normal carotid ultrasound; possible LAA thrombus by TEE; virtual complete neurologic recovery  . Fasting hyperglycemia     120 fasting  . Chronic kidney disease, stage 2, mildly decreased GFR     GFR of approximately 60  . Breast carcinoma 1995    1995  . Depression   . Diverticulosis of colon (without mention of hemorrhage) 2012    Dr. Karilyn Cota  . Gastroparesis   . Hemorrhoids   . Anemia     Medications:  Scheduled:  . atorvastatin  20 mg Oral Daily  . buPROPion  150 mg Oral Daily  . fluticasone  1 puff Inhalation BID  . furosemide  40 mg Oral Daily  . levothyroxine  112 mcg Oral QAC breakfast  . losartan  50 mg Oral Daily  . metoprolol tartrate  25 mg Oral BID  . pantoprazole  40 mg Oral Daily  . sodium chloride  3 mL Intravenous Q12H  . sucralfate  1 g Oral TID WC & HS   Infusions:   . heparin 900 Units/hr (07/11/13 0245)    Assessment: Heparin level = 0.31 on 900 units/hr IV heparin infusion in this 67 yo female.  Heparin started @ 02:45 today 8 hours post sheath removal s/p cath.  Anticoagulation for severe mitral valve stenosis / hx of cariodembolic stroke.  Patient was on xarelto previously but last dose was 07/06/13 so most likely out of her system. First heparin level is therapeutic. Hgb is 13.4 and PLTC is 84K, (admit PLTC was 84K. No bleeding noted.     Goal of Therapy:  Heparin level 0.3-0.7 units/ml Monitor platelets by anticoagulation protocol: Yes   Plan:  1) Continue heparin at 900 units/hr.  2) Recheck  heparin level in 6 hrs to confirm remains therapeutic then will monitor daily heparin level and CBC.   Noah Delaine, RPh Clinical Pharmacist Pager: [phone] 07/11/2013,4:56 PM

## 2013-07-11 NOTE — Progress Notes (Signed)
SUBJECTIVE:  Breathing much better.  No chest pain.  No SOB.  She tolerated the TEE   PHYSICAL EXAM Filed Vitals:   07/11/13 1310 07/11/13 1320 07/11/13 1330 07/11/13 1357  BP: 145/52 138/64 143/45 124/50  Pulse: 63 62 63 66  Temp:    98 F (36.7 C)  TempSrc:    Oral  Resp: 11 13 14 18   Height:      Weight:      SpO2: 100% 100% 100% 97%   General:  No distress Lungs:  Clear Heart:  RRR Abdomen:  Positive bowel sounds, no rebound no guarding Extremities:  No edema  LABS:  Results for orders placed during the hospital encounter of 07/06/13 (from the past 24 hour(s))  POCT I-STAT 3, BLOOD GAS (G3+)     Status: Abnormal   Collection Time    07/10/13  5:50 PM      Result Value Range   pH, Arterial 7.354  7.350 - 7.450   pCO2 arterial 48.6 (*) 35.0 - 45.0 mmHg   pO2, Arterial 75.0 (*) 80.0 - 100.0 mmHg   Bicarbonate 27.1 (*) 20.0 - 24.0 mEq/L   TCO2 29  0 - 100 mmol/L   O2 Saturation 94.0     Acid-Base Excess 1.0  0.0 - 2.0 mmol/L   Sample type ARTERIAL    POCT I-STAT 3, BLOOD GAS (G3P V)     Status: Abnormal   Collection Time    07/10/13  5:53 PM      Result Value Range   pH, Ven 7.300  7.250 - 7.300   pCO2, Ven 55.3 (*) 45.0 - 50.0 mmHg   pO2, Ven 34.0  30.0 - 45.0 mmHg   Bicarbonate 27.2 (*) 20.0 - 24.0 mEq/L   TCO2 29  0 - 100 mmol/L   O2 Saturation 58.0     Sample type VENOUS     Comment NOTIFIED PHYSICIAN    CBC     Status: Abnormal   Collection Time    07/11/13  9:50 AM      Result Value Range   WBC 9.0  4.0 - 10.5 K/uL   RBC 4.56  3.87 - 5.11 MIL/uL   Hemoglobin 13.4  12.0 - 15.0 g/dL   HCT 40.9  81.1 - 91.4 %   MCV 86.6  78.0 - 100.0 fL   MCH 29.4  26.0 - 34.0 pg   MCHC 33.9  30.0 - 36.0 g/dL   RDW 78.2  95.6 - 21.3 %   Platelets 84 (*) 150 - 400 K/uL    Intake/Output Summary (Last 24 hours) at 07/11/13 1446 Last data filed at 07/11/13 0900  Gross per 24 hour  Intake 638.25 ml  Output    501 ml  Net 137.25 ml    ASSESSMENT AND  PLAN:  Acute respiratory failure with hypoxia:  Moderately severe mitral stenosis.  AS is moderate by TEE and TTE.  However, there was not a significant gradient on cath pull back.  The valve however is moderately thickened.  I believe that mitral valve replacement is indicated.  I have discussed the aortic valve with the patient and the family.  They would prefer, if there is any question of needing AVR in the future, to have both valves replaced at this time.   I have consulted Dr. Laneta Simmers.   (Note the EF was thought to be lower on TEE but was 55% on TTE).    Cardioembolic stroke:  Holding Xarelto.  On heparin.    Rheumatic heart disease/Severe mitral valve stenosis:  See above.      Rollene Rotunda 07/11/2013 2:46 PM

## 2013-07-11 NOTE — Interval H&P Note (Signed)
History and Physical Interval Note:  07/11/2013 12:25 PM  [name]  has presented today for surgery, with the diagnosis of rhematic valve  The various methods of treatment have been discussed with the patient and family. After consideration of risks, benefits and other options for treatment, the patient has consented to  Procedure(s): TRANSESOPHAGEAL ECHOCARDIOGRAM (TEE) (N/A) as a surgical intervention .  The patient's history has been reviewed, patient examined, no change in status, stable for surgery.  I have reviewed the patient's chart and labs.  Questions were answered to the patient's satisfaction.     Elyn Aquas.

## 2013-07-12 ENCOUNTER — Encounter (HOSPITAL_COMMUNITY): Payer: Self-pay | Admitting: Cardiovascular Disease

## 2013-07-12 ENCOUNTER — Other Ambulatory Visit: Payer: Self-pay

## 2013-07-12 ENCOUNTER — Inpatient Hospital Stay (HOSPITAL_COMMUNITY): Payer: Medicare Other

## 2013-07-12 DIAGNOSIS — I359 Nonrheumatic aortic valve disorder, unspecified: Secondary | ICD-10-CM

## 2013-07-12 DIAGNOSIS — Z952 Presence of prosthetic heart valve: Secondary | ICD-10-CM | POA: Insufficient documentation

## 2013-07-12 DIAGNOSIS — I351 Nonrheumatic aortic (valve) insufficiency: Secondary | ICD-10-CM

## 2013-07-12 DIAGNOSIS — I059 Rheumatic mitral valve disease, unspecified: Secondary | ICD-10-CM

## 2013-07-12 DIAGNOSIS — I350 Nonrheumatic aortic (valve) stenosis: Secondary | ICD-10-CM

## 2013-07-12 DIAGNOSIS — Z01810 Encounter for preprocedural cardiovascular examination: Secondary | ICD-10-CM

## 2013-07-12 LAB — CBC
Hemoglobin: 13.3 g/dL (ref 12.0–15.0)
MCHC: 34 g/dL (ref 30.0–36.0)
Platelets: 91 10*3/uL — ABNORMAL LOW (ref 150–400)
RBC: 4.51 MIL/uL (ref 3.87–5.11)
WBC: 11.7 10*3/uL — ABNORMAL HIGH (ref 4.0–10.5)

## 2013-07-12 LAB — HEPARIN LEVEL (UNFRACTIONATED)
Heparin Unfractionated: 0.38 IU/mL (ref 0.30–0.70)
Heparin Unfractionated: 0.41 IU/mL (ref 0.30–0.70)

## 2013-07-12 MED ORDER — HYDROCODONE-HOMATROPINE 5-1.5 MG/5ML PO SYRP: 5.0000 mL | ORAL_SOLUTION | ORAL | Status: DC | PRN

## 2013-07-12 MED ORDER — FUROSEMIDE 40 MG PO TABS: 40.0000 mg | ORAL_TABLET | Freq: Every day | ORAL | Status: DC

## 2013-07-12 MED ORDER — INFLUENZA VAC SPLIT QUAD 0.5 ML IM SUSP: 0.5000 mL | INTRAMUSCULAR | Status: DC

## 2013-07-12 MED ORDER — RIVAROXABAN 20 MG PO TABS: 20.0000 mg | ORAL_TABLET | Freq: Every day | ORAL | Status: DC

## 2013-07-12 MED ORDER — ALBUTEROL SULFATE (5 MG/ML) 0.5% IN NEBU
2.5000 mg | INHALATION_SOLUTION | Freq: Once | RESPIRATORY_TRACT | Status: AC
  Administered 2013-07-12: 2.5 mg via RESPIRATORY_TRACT

## 2013-07-12 MED ORDER — METOPROLOL TARTRATE 25 MG PO TABS: 25.0000 mg | ORAL_TABLET | Freq: Two times a day (BID) | ORAL | Status: DC

## 2013-07-12 NOTE — Discharge Summary (Signed)
Discharge Summary   Patient ID: [name],  MRN: [id number], DOB/AGE: 1946-04-13 67 y.o.  Admit date: 07/06/2013 Discharge date: 07/12/2013  Primary Care Provider: Carylon Perches Primary Cardiologist: Dominga Ferry, MD   Discharge Diagnoses Principal Problem:   Acute respiratory failure with hypoxia  Active Problems:   Rheumatic heart disease    Severe mitral valve stenosis    Moderate aortic stenosis    Moderate aortic insufficiency   Acute pulmonary edema  **Discharge weight 165 lbs.   Hypertension   Cardioembolic stroke (in setting of LAA thrombus 01/2012)    Chronic anticoagulation (Xarelto)   Gastroesophageal reflux disease   Hyperlipidemia   Hypothyroidism   Fasting hyperglycemia (A1c 6.6 01/2012).   Pleural effusion, bilateral   Chest pain  Allergies No Known Allergies  Procedures  2D Echocardiogram 10.6.2014  Study Conclusions  - Study data: Technically difficult study. - Left ventricle: The cavity size was normal. Wall thickness   was normal. Systolic function was normal. The estimated   ejection fraction was in the range of 55% to 60%.   Indeterminate diastolic function. There is evidence of   very high left atrial pressures (E/e' 63) - Aortic valve: Trileaflet; mildly thickened leaflets.   Accurate evaluation of aortic stenosis limited in the   setting of severe mitral stenosis and decreased   ventricular loadin. Morphologically there does not appear   to be significant stenosis. By gradient (mean PG 21 mmHg)   the stenosis is moderate, by AVA VTI (0.8) stenosis is   severe. Moderate regurgitation, AR PHT is 439 cm/s. Valve   area: 0.78cm^2(VTI). Valve area: 0.78cm^2 (Vmax). - Mitral valve: The findings are consistent with moderate to   severe stenosis. (Mean PG 12 mmHg, MVA by PHT 1.3. Cannot   use MVA by VTI due to moderate AI. Valve area by pressure   half-time: 1.38cm^2. - Left atrium: The atrium was severely dilated. - Right ventricle: Systolic  function was mildly reduced, RV   TAPSE is 1.4, TV systolic annular tissue velocity 10 cm/s. - Pulmonary arteries: Systolic pressure was moderately   increased. PA peak pressure: 58mm Hg (S) (PASP based on a   fairly faint spectral Doppler waveform) _____________  Cardiac Catheterization  10.8.2014  Hemodynamic Findings: Ao: 132/57                LV: 144/2/11 RA: 10         RV: 63/2/10 PA: 62/19 (mean 41)     PCWP:  19 Fick Cardiac Output: 3.73 L/min Fick Cardiac Index: 2.11 L/min/m2 Central Aortic Saturation: 94% Pulmonary Artery Saturation: 58%  Aortic valve: Peak to peak gradient 12 mm Hg. Valve easily crossed with J-wire.    Angiographic Findings:  Left main: No obstructive disease.  Left Anterior Descending Artery: Large caliber vessel that courses to the apex. The mid and distal vessel becomes small in caliber. Small caliber diagonal branch. No obstructive disease.   Circumflex Artery: Large caliber vessel with two small obtuse marginal branches. No obstructive disease.   Right Coronary Artery: Large, dominant vessel with no obstructive disease.   Left Ventricular Angiogram: Deferred.  _____________   Transesophageal Echocardiogram 10.9.2014  Study Conclusions  - Left ventricle: Systolic function was moderately reduced.   The estimated ejection fraction was in the range of 35% to   40%. - Aortic valve: There was mild to moderate stenosis. Mild to   moderate regurgitation. - Mitral valve: The findings are consistent with moderate to   severe stenosis. Valve area  by pressure half-time:   1.45cm^2. - Left atrium: There was spontaneous echo contrast   ("smoke"). - Atrial septum: No defect or patent foramen ovale was   identified. - Tricuspid valve: No evidence of vegetation. _____________   History of Present Illness  67 year old female with prior history of rheumatic valvular disease including mitral stenosis, and both aortic insufficiency and stenosis. She  also has a prior history of cardioembolic CVA with left atrial appendage thrombus in April of 2013.  She has been chronically anticoagulated since with Xarelto. She was in her usual state of health until approximately 3 weeks prior to admission when she began to experience orthopnea, intermittent resting chest discomfort, progressive dyspnea exertion, and lower extremity edema. She presented to the emergency department at any [facility] hospital on the evening of October 4 secondary to worsening dyspnea and she was found to have pulmonary edema. She was admitted by internal medicine and placed on IV diuretics. CT angiography of the chest was performed and was negative for pulmonary embolus. Cardiology was consulted and recommended repeat 2-D echocardiogram which subsequently showed progression of her mitral valve and aortic disease. Decision was made to transfer to [facility] for further evaluation.  Hospital Course  Following arrival to [facility], her home dose of Xarelto was held and she was bridged with heparin in preparation for diagnostic catheterization. She initially remained on intravenous Lasix however this was quickly transitioned to an oral formulation. With diuresis, she did have symptomatic improvement and reduction in weight to 165 pounds.  Her creatinine remained stable and on October 8, she underwent diagnostic cardiac catheterization revealing normal coronary arteries and elevated right heart pressures as outlined above. No further ischemic evaluation was planned and recommendation was made for transesophageal echocardiogram. This was performed on October 9, and suggested mild to moderate aortic stenosis and insufficiency along with moderate to severe mitral stenosis. No left atrial appendage thrombus was noted, though there was spontaneous echo contrast in the left atrium. Following transesophageal echocardiogram, cardiothoracic surgery was consulted and it was felt that patient would benefit from  both mitral valve and aortic valve replacements using tissue valves. This has tentatively been scheduled for late next week and the patient is felt to be stable for discharge today. At discharge, she we'll resume Xarelto therapy however has been advised to discontinue it following her evening dose on Sunday, October 12. Given the relative short duration of time between her last Xarelto dose and her surgical date (tentatively Thursday, October 16), we do not plan to bridge Ms. Chrobak with Lovenox at this time. She will be discharged home today in good condition and will be contacted by the cardiothoracic surgery team with further instructions regarding her surgical day.  Discharge Vitals Blood pressure 124/51, pulse 70, temperature 98.1 F (36.7 C), temperature source Oral, resp. rate 18, height 5\' 2"  (1.575 m), weight 165 lb 12.8 oz (75.206 kg), SpO2 97.00%.  Filed Weights   07/10/13 0556 07/11/13 0509 07/12/13 0458  Weight: 166 lb 0.1 oz (75.3 kg) 165 lb 3.2 oz (74.934 kg) 165 lb 12.8 oz (75.206 kg)   Labs  CBC  Recent Labs  07/11/13 0950 07/12/13 0500  WBC 9.0 11.7*  HGB 13.4 13.3  HCT 39.5 39.1  MCV 86.6 86.7  PLT 84* 91*   Basic Metabolic Panel  Recent Labs  07/10/13 0705  NA 135  K 4.7  CL 100  CO2 25  GLUCOSE 114*  BUN 19  CREATININE 1.05  CALCIUM 8.9  Liver Function Tests Lab Results  Component Value Date   ALT 25 07/07/2013   AST 26 07/07/2013   ALKPHOS 77 07/07/2013   BILITOT 0.4 07/07/2013   Cardiac Enzymes Lab Results  Component Value Date   TROPONINI <0.30 07/07/2013   Disposition  Pt is being discharged home today in good condition.  Follow-up Plans & Appointments      Follow-up Information   Follow up with [name], MD. (you will be contacted to arrange surgery for next week.)    Specialty:  Cardiothoracic Surgery   Contact information:   [address] [place] [address] [phone]       Follow up with Dina Rich On 08/06/2013. (10:40 AM)    Contact information:   [facility] [address] [place], [place] [phone]     Discharge Medications    Medication List         atorvastatin 10 MG tablet  Commonly known as:  LIPITOR  Take 20 mg by mouth daily.     beclomethasone 80 MCG/ACT inhaler  Commonly known as:  QVAR  Inhale 2 puffs into the lungs 2 (two) times daily.     buPROPion 150 MG 24 hr tablet  Commonly known as:  WELLBUTRIN XL  Take 150 mg by mouth daily.     dexlansoprazole 60 MG capsule  Commonly known as:  DEXILANT  Take 1 capsule (60 mg total) by mouth daily.     furosemide 40 MG tablet  Commonly known as:  LASIX  Take 1 tablet (40 mg total) by mouth daily.     hyoscyamine 0.125 MG SL tablet  Commonly known as:  LEVSIN SL  Place 1 tablet (0.125 mg total) under the tongue every 4 (four) hours as needed for cramping.     levothyroxine 112 MCG tablet  Commonly known as:  SYNTHROID, LEVOTHROID  Take 112 mcg by mouth daily before breakfast.     losartan 100 MG tablet  Commonly known as:  COZAAR  Take 50 mg by mouth daily.     metoprolol tartrate 25 MG tablet  Commonly known as:  LOPRESSOR  Take 1 tablet (25 mg total) by mouth 2 (two) times daily.     multivitamin with minerals Tabs tablet  Take 1 tablet by mouth daily.     Rivaroxaban 20 MG Tabs tablet  Commonly known as:  XARELTO  Take 1 tablet (20 mg total) by mouth daily with supper.     saccharomyces boulardii 250 MG capsule  Commonly known as:  FLORASTOR  Take 250 mg by mouth daily.     sucralfate 1 GM/10ML suspension  Commonly known as:  CARAFATE  Can take every 4 hours as needed     VENTOLIN HFA 108 (90 BASE) MCG/ACT inhaler  Generic drug:  albuterol  Inhale 2 puffs into the lungs every 4 (four) hours as needed for wheezing.       Outstanding Labs/Studies  None  Duration of Discharge Encounter   Greater than 30 minutes including physician time.  Signed, Nicolasa Ducking  NP 07/12/2013, 9:58 AM   Patient seen and examined.  Plan as discussed in my rounding note for today and outlined above. Fayrene Fearing Shaina Gullatt  07/12/2013  1:12 PM

## 2013-07-12 NOTE — Progress Notes (Signed)
ANTICOAGULATION CONSULT NOTE - Follow Up Consult  Pharmacy Consult for heparin Indication: severe mitral valve stenosis w/ h/o CVA  Labs:  Recent Labs  07/09/13 0547 07/10/13 0705 07/11/13 0950 07/11/13 1559 07/11/13 2200  HGB 13.2 12.9 13.4  --   --   HCT 40.9 39.3 39.5  --   --   PLT 93* 94* 84*  --   --   HEPARINUNFRC  --   --   --  0.31 0.38  CREATININE 1.18* 1.05  --   --   --     Assessment/Plan:  67yo female remains therapeutic on heparin.  Will continue gtt at current rate and continue to monitor.  Vernard Gambles, PharmD, BCPS  07/12/2013,1:23 AM

## 2013-07-12 NOTE — Progress Notes (Signed)
SUBJECTIVE:  Breathing much better.  No chest pain.  No SOB.     PHYSICAL EXAM Filed Vitals:   07/11/13 2143 07/11/13 2156 07/12/13 0131 07/12/13 0458  BP: 96/55  114/90 124/51  Pulse: 71  68 70  Temp: 97.6 F (36.4 C)  98 F (36.7 C) 98.1 F (36.7 C)  TempSrc: Oral  Oral Oral  Resp: 18  18 18   Height:      Weight:    165 lb 12.8 oz (75.206 kg)  SpO2: 100% 100% 97% 97%   General:  No distress Lungs:  Clear Heart:  RRR Abdomen:  Positive bowel sounds, no rebound no guarding Extremities:  No edema, right groin OK.  LABS:  Results for orders placed during the hospital encounter of 07/06/13 (from the past 24 hour(s))  CBC     Status: Abnormal   Collection Time    07/11/13  9:50 AM      Result Value Range   WBC 9.0  4.0 - 10.5 K/uL   RBC 4.56  3.87 - 5.11 MIL/uL   Hemoglobin 13.4  12.0 - 15.0 g/dL   HCT 16.1  09.6 - 04.5 %   MCV 86.6  78.0 - 100.0 fL   MCH 29.4  26.0 - 34.0 pg   MCHC 33.9  30.0 - 36.0 g/dL   RDW 40.9  81.1 - 91.4 %   Platelets 84 (*) 150 - 400 K/uL  HEPARIN LEVEL (UNFRACTIONATED)     Status: None   Collection Time    07/11/13  3:59 PM      Result Value Range   Heparin Unfractionated 0.31  0.30 - 0.70 IU/mL  GLUCOSE, CAPILLARY     Status: Abnormal   Collection Time    07/11/13  9:39 PM      Result Value Range   Glucose-Capillary 185 (*) 70 - 99 mg/dL  HEPARIN LEVEL (UNFRACTIONATED)     Status: None   Collection Time    07/11/13 10:00 PM      Result Value Range   Heparin Unfractionated 0.38  0.30 - 0.70 IU/mL  CBC     Status: Abnormal   Collection Time    07/12/13  5:00 AM      Result Value Range   WBC 11.7 (*) 4.0 - 10.5 K/uL   RBC 4.51  3.87 - 5.11 MIL/uL   Hemoglobin 13.3  12.0 - 15.0 g/dL   HCT 78.2  95.6 - 21.3 %   MCV 86.7  78.0 - 100.0 fL   MCH 29.5  26.0 - 34.0 pg   MCHC 34.0  30.0 - 36.0 g/dL   RDW 08.6  57.8 - 46.9 %   Platelets 91 (*) 150 - 400 K/uL    Intake/Output Summary (Last 24 hours) at 07/12/13 0725 Last data  filed at 07/12/13 0021  Gross per 24 hour  Intake    840 ml  Output   1641 ml  Net   -801 ml    ASSESSMENT AND PLAN:  Acute respiratory failure with hypoxia:  Moderately severe mitral stenosis.  AS is moderate by TEE and TTE.  However, there was not a significant gradient on cath pull back.  The valve however is moderately thickened.  I believe that mitral valve replacement is indicated.  I have discussed the aortic valve with the patient and the family.  I agree that both valves should be replaced.  This will be scheduled for next week.  Continue current diuretic.  Cardioembolic stroke:  Restart Xarelto.  Stop after Sunday if surgery is Thursday.    Rheumatic heart disease/Severe mitral valve stenosis:  See above.      [name] 07/12/2013 7:25 AM

## 2013-07-12 NOTE — Progress Notes (Signed)
VASCULAR LAB PRELIMINARY  PRELIMINARY  PRELIMINARY  PRELIMINARY  Pre-op Cardiac Surgery  Carotid Findings:  Bilateral:  1-39% ICA stenosis.  There is evidence of elevated peak systolic ICA velocities bilaterally, however, there is no evidence of significant plaque morphology to support 40-59% stenosis.  Vertebral artery flow is antegrade.      Upper Extremity Right Left  Brachial Pressures triphasic 148 triphasic  Radial Waveforms triphasic triphasic  Ulnar Waveforms triphasic triphasic  Palmar Arch (Allen's Test) * **   Findings:  *Right:  Doppler signal is unaffected with radial compressions and obliterates with ulnar compression.  **Left:  Doppler signal obliterates with radial compressions and is unaffected with ulnar compression.    Kia Stavros, RVT 07/12/2013, 3:35 PM

## 2013-07-16 ENCOUNTER — Other Ambulatory Visit: Payer: Self-pay | Admitting: *Deleted

## 2013-07-16 ENCOUNTER — Ambulatory Visit: Payer: Medicare Other | Admitting: Cardiovascular Disease

## 2013-07-16 ENCOUNTER — Other Ambulatory Visit (HOSPITAL_COMMUNITY): Payer: Self-pay | Admitting: *Deleted

## 2013-07-16 DIAGNOSIS — I059 Rheumatic mitral valve disease, unspecified: Secondary | ICD-10-CM

## 2013-07-16 DIAGNOSIS — I359 Nonrheumatic aortic valve disorder, unspecified: Secondary | ICD-10-CM

## 2013-07-16 NOTE — Pre-Procedure Instructions (Signed)
DODI LEU  07/16/2013   Your procedure is scheduled on:  Thursday, July 18, 2013 at 8:15 AM.   Report to [facility] Entrance "A" at 5:30 AM.  Call this number if you have problems the morning of surgery: [phone]   Remember:   Do not eat food or drink liquids after midnight tonight, 07/17/13.   Take these medicines the morning of surgery with A SIP OF WATER: albuterol (VENTOLIN HFA), buPROPion (WELLBUTRIN XL), levothyroxine (SYNTHROID, LEVOTHROID), metoprolol tartrate (LOPRESSOR), beclomethasone (QVAR)        Do not wear jewelry, make-up or nail polish.  Do not wear lotions, powders, or perfumes. You may wear deodorant.  Do not shave 48 hours prior to surgery. .  Do not bring valuables to the hospital.  [facility] is not responsible                  for any belongings or valuables.               Contacts, dentures or bridgework may not be worn into surgery.  Leave suitcase in the car. After surgery it may be brought to your room.  For patients admitted to the hospital, discharge time is determined by your                treatment team.                 Special Instructions: Shower using CHG 2 nights before surgery and the night before surgery.  If you shower the day of surgery use CHG.  Use special wash - you have one bottle of CHG for all showers.  You should use approximately 1/3 of the bottle for each shower.   Please read over the following fact sheets that you were given: Pain Booklet, Coughing and Deep Breathing, Blood Transfusion Information, MRSA Information and Surgical Site Infection Prevention

## 2013-07-17 ENCOUNTER — Other Ambulatory Visit: Payer: Self-pay | Admitting: *Deleted

## 2013-07-17 ENCOUNTER — Encounter (HOSPITAL_COMMUNITY): Payer: Self-pay | Admitting: Certified Registered Nurse Anesthetist

## 2013-07-17 ENCOUNTER — Encounter (HOSPITAL_COMMUNITY)
Admission: RE | Admit: 2013-07-17 | Discharge: 2013-07-17 | Disposition: A | Discharge status: Home / Self Care | Payer: Medicare Other | Source: Ambulatory Visit | Attending: Surgery | Admitting: Surgery

## 2013-07-17 ENCOUNTER — Telehealth: Payer: Self-pay

## 2013-07-17 ENCOUNTER — Encounter (HOSPITAL_COMMUNITY): Payer: Self-pay

## 2013-07-17 VITALS — BP 118/70 | HR 61 | Temp 98.2°F | Resp 20 | Ht 62.0 in | Wt 167.5 lb

## 2013-07-17 DIAGNOSIS — I359 Nonrheumatic aortic valve disorder, unspecified: Secondary | ICD-10-CM

## 2013-07-17 DIAGNOSIS — B958 Unspecified staphylococcus as the cause of diseases classified elsewhere: Secondary | ICD-10-CM

## 2013-07-17 DIAGNOSIS — Z01812 Encounter for preprocedural laboratory examination: Secondary | ICD-10-CM | POA: Insufficient documentation

## 2013-07-17 DIAGNOSIS — Z01818 Encounter for other preprocedural examination: Secondary | ICD-10-CM | POA: Insufficient documentation

## 2013-07-17 DIAGNOSIS — I059 Rheumatic mitral valve disease, unspecified: Secondary | ICD-10-CM

## 2013-07-17 LAB — BLOOD GAS, ARTERIAL
Bicarbonate: 24.7 mEq/L — ABNORMAL HIGH (ref 20.0–24.0)
Drawn by: 18160
FIO2: 0.21 %
pCO2 arterial: 28.2 mmHg — ABNORMAL LOW (ref 35.0–45.0)
pH, Arterial: 7.552 — ABNORMAL HIGH (ref 7.350–7.450)

## 2013-07-17 LAB — URINE MICROSCOPIC-ADD ON

## 2013-07-17 LAB — SURGICAL PCR SCREEN
MRSA, PCR: POSITIVE — AB
Staphylococcus aureus: POSITIVE — AB

## 2013-07-17 LAB — CBC
MCV: 85.7 fL (ref 78.0–100.0)
Platelets: 124 10*3/uL — ABNORMAL LOW (ref 150–400)
RDW: 13.9 % (ref 11.5–15.5)
WBC: 9.1 10*3/uL (ref 4.0–10.5)

## 2013-07-17 LAB — COMPREHENSIVE METABOLIC PANEL
ALT: 21 U/L (ref 0–35)
AST: 23 U/L (ref 0–37)
Albumin: 3.5 g/dL (ref 3.5–5.2)
Chloride: 96 mEq/L (ref 96–112)
Creatinine, Ser: 1.1 mg/dL (ref 0.50–1.10)
GFR calc non Af Amer: 50 mL/min — ABNORMAL LOW (ref >90)
Potassium: 4.2 mEq/L (ref 3.5–5.1)
Sodium: 132 mEq/L — ABNORMAL LOW (ref 135–145)
Total Bilirubin: 0.2 mg/dL — ABNORMAL LOW (ref 0.3–1.2)
Total Protein: 7.5 g/dL (ref 6.0–8.3)

## 2013-07-17 LAB — URINALYSIS, ROUTINE W REFLEX MICROSCOPIC
Bilirubin Urine: NEGATIVE
Glucose, UA: NEGATIVE mg/dL
Hgb urine dipstick: NEGATIVE
Ketones, ur: NEGATIVE mg/dL
Specific Gravity, Urine: 1.009 (ref 1.005–1.030)
pH: 7.5 (ref 5.0–8.0)

## 2013-07-17 LAB — PROTIME-INR: INR: 1.03 (ref 0.00–1.49)

## 2013-07-17 LAB — APTT: aPTT: 24 seconds (ref 24–37)

## 2013-07-17 LAB — HEMOGLOBIN A1C: Mean Plasma Glucose: 143 mg/dL — ABNORMAL HIGH (ref <117)

## 2013-07-17 MED ORDER — MAGNESIUM SULFATE 50 % IJ SOLN
40.0000 meq | INTRAMUSCULAR | Status: DC
  Filled 2013-07-17: qty 10

## 2013-07-17 MED ORDER — PLASMA-LYTE 148 IV SOLN
INTRAVENOUS | Status: AC
  Administered 2013-07-18: 08:00:00
  Filled 2013-07-17: qty 2.5

## 2013-07-17 MED ORDER — DEXTROSE 5 % IV SOLN
750.0000 mg | INTRAVENOUS | Status: DC
  Filled 2013-07-17: qty 750

## 2013-07-17 MED ORDER — SODIUM CHLORIDE 0.9 % IV SOLN
INTRAVENOUS | Status: AC
  Administered 2013-07-18: 69.8 mL/h via INTRAVENOUS
  Filled 2013-07-17: qty 40

## 2013-07-17 MED ORDER — CHLORHEXIDINE GLUCONATE 4 % EX LIQD: 30.0000 mL | CUTANEOUS | Status: DC

## 2013-07-17 MED ORDER — PHENYLEPHRINE HCL 10 MG/ML IJ SOLN
30.0000 ug/min | INTRAVENOUS | Status: AC
  Administered 2013-07-18: 10 ug/min via INTRAVENOUS
  Administered 2013-07-18: 5 ug/min via INTRAVENOUS
  Filled 2013-07-17: qty 2

## 2013-07-17 MED ORDER — VANCOMYCIN HCL 10 G IV SOLR
1250.0000 mg | INTRAVENOUS | Status: AC
  Administered 2013-07-18: 1250 mg via INTRAVENOUS
  Filled 2013-07-17 (×2): qty 1250

## 2013-07-17 MED ORDER — EPINEPHRINE HCL 1 MG/ML IJ SOLN
0.5000 ug/min | INTRAVENOUS | Status: DC
  Filled 2013-07-17: qty 4

## 2013-07-17 MED ORDER — MUPIROCIN 2 % EX OINT: TOPICAL_OINTMENT | Freq: Two times a day (BID) | CUTANEOUS | Status: DC

## 2013-07-17 MED ORDER — DEXTROSE 5 % IV SOLN
1.5000 g | INTRAVENOUS | Status: AC
  Administered 2013-07-18: 1.5 g via INTRAVENOUS
  Administered 2013-07-18: .75 g via INTRAVENOUS
  Filled 2013-07-17 (×2): qty 1.5

## 2013-07-17 MED ORDER — NITROGLYCERIN IN D5W 200-5 MCG/ML-% IV SOLN
2.0000 ug/min | INTRAVENOUS | Status: AC
  Administered 2013-07-18: 20 ug/min via INTRAVENOUS
  Filled 2013-07-17: qty 250

## 2013-07-17 MED ORDER — SODIUM CHLORIDE 0.9 % IV SOLN
INTRAVENOUS | Status: AC
  Administered 2013-07-18: 1.8 [IU]/h via INTRAVENOUS
  Filled 2013-07-17: qty 1

## 2013-07-17 MED ORDER — POTASSIUM CHLORIDE 2 MEQ/ML IV SOLN
80.0000 meq | INTRAVENOUS | Status: DC
  Filled 2013-07-17: qty 40

## 2013-07-17 MED ORDER — SODIUM CHLORIDE 0.9 % IV SOLN
INTRAVENOUS | Status: DC
  Filled 2013-07-17: qty 30

## 2013-07-17 MED ORDER — METOPROLOL TARTRATE 12.5 MG HALF TABLET: 12.5000 mg | ORAL_TABLET | Freq: Once | ORAL | Status: DC

## 2013-07-17 MED ORDER — DEXMEDETOMIDINE HCL IN NACL 400 MCG/100ML IV SOLN
0.1000 ug/kg/h | INTRAVENOUS | Status: AC
  Administered 2013-07-18: 0.3 ug/kg/h via INTRAVENOUS
  Filled 2013-07-17: qty 100

## 2013-07-17 MED ORDER — DOPAMINE-DEXTROSE 3.2-5 MG/ML-% IV SOLN
2.0000 ug/kg/min | INTRAVENOUS | Status: AC
Start: 2013-07-18 — End: 2013-07-18
  Administered 2013-07-18: 3 ug/kg/min via INTRAVENOUS
  Filled 2013-07-17: qty 250

## 2013-07-17 NOTE — Telephone Encounter (Signed)
Rx for Norco was canceled RX was entered in ERROR

## 2013-07-17 NOTE — Telephone Encounter (Signed)
Rx for Norco 7.5 325 mg #40 no refill printed out and pt will pick up RX in the am

## 2013-07-17 NOTE — Pre-Procedure Instructions (Signed)
ZENAYA ULATOWSKI  07/17/2013   Your procedure is scheduled on:  07/18/13  Report to [facility]  2 * 3 at 530 AM.  Call this number if you have problems the morning of surgery: [phone]   Remember:   Do not eat food or drink liquids after midnight.   Take these medicines the morning of surgery with A SIP OF WATER: all inhalers,hydrocodone,synthroid,metoprolol   Do not wear jewelry, make-up or nail polish.  Do not wear lotions, powders, or perfumes. You may wear deodorant.  Do not shave 48 hours prior to surgery. Men may shave face and neck.  Do not bring valuables to the hospital.  [facility] is not responsible                  for any belongings or valuables.               Contacts, dentures or bridgework may not be worn into surgery.  Leave suitcase in the car. After surgery it may be brought to your room.  For patients admitted to the hospital, discharge time is determined by your                treatment team.               Patients discharged the day of surgery will not be allowed to drive  home.  Name and phone number of your driver: family  Special Instructions: Shower using CHG 2 nights before surgery and the night before surgery.  If you shower the day of surgery use CHG.  Use special wash - you have one bottle of CHG for all showers.  You should use approximately 1/3 of the bottle for each shower.   Please read over the following fact sheets that you were given: Pain Booklet, Coughing and Deep Breathing, Blood Transfusion Information, MRSA Information and Surgical Site Infection Prevention

## 2013-07-18 ENCOUNTER — Inpatient Hospital Stay (HOSPITAL_COMMUNITY)
Admission: RE | Admit: 2013-07-18 | Discharge: 2013-08-03 | DRG: 220 | Disposition: A | Discharge status: Home / Self Care | Payer: Medicare Other | Source: Ambulatory Visit | Attending: Surgery | Admitting: Surgery

## 2013-07-18 ENCOUNTER — Encounter (HOSPITAL_COMMUNITY)
Admission: RE | Disposition: A | Discharge status: Home / Self Care | Payer: Medicare Other | Source: Ambulatory Visit | Attending: Surgery

## 2013-07-18 ENCOUNTER — Inpatient Hospital Stay (HOSPITAL_COMMUNITY): Payer: Medicare Other | Admitting: Certified Registered Nurse Anesthetist

## 2013-07-18 ENCOUNTER — Inpatient Hospital Stay (HOSPITAL_COMMUNITY): Payer: Medicare Other

## 2013-07-18 ENCOUNTER — Encounter (HOSPITAL_COMMUNITY): Payer: Medicare Other | Admitting: Certified Registered Nurse Anesthetist

## 2013-07-18 ENCOUNTER — Encounter (HOSPITAL_COMMUNITY): Payer: Self-pay | Admitting: *Deleted

## 2013-07-18 DIAGNOSIS — Z01818 Encounter for other preprocedural examination: Secondary | ICD-10-CM

## 2013-07-18 DIAGNOSIS — E871 Hypo-osmolality and hyponatremia: Secondary | ICD-10-CM

## 2013-07-18 DIAGNOSIS — I359 Nonrheumatic aortic valve disorder, unspecified: Secondary | ICD-10-CM

## 2013-07-18 DIAGNOSIS — I4891 Unspecified atrial fibrillation: Secondary | ICD-10-CM

## 2013-07-18 DIAGNOSIS — F3289 Other specified depressive episodes: Secondary | ICD-10-CM | POA: Diagnosis present

## 2013-07-18 DIAGNOSIS — E8779 Other fluid overload: Secondary | ICD-10-CM | POA: Diagnosis not present

## 2013-07-18 DIAGNOSIS — Z01812 Encounter for preprocedural laboratory examination: Secondary | ICD-10-CM

## 2013-07-18 DIAGNOSIS — F329 Major depressive disorder, single episode, unspecified: Secondary | ICD-10-CM | POA: Diagnosis present

## 2013-07-18 DIAGNOSIS — I4892 Unspecified atrial flutter: Secondary | ICD-10-CM | POA: Diagnosis not present

## 2013-07-18 DIAGNOSIS — I050 Rheumatic mitral stenosis: Secondary | ICD-10-CM

## 2013-07-18 DIAGNOSIS — K219 Gastro-esophageal reflux disease without esophagitis: Secondary | ICD-10-CM | POA: Diagnosis present

## 2013-07-18 DIAGNOSIS — Z79899 Other long term (current) drug therapy: Secondary | ICD-10-CM

## 2013-07-18 DIAGNOSIS — Z853 Personal history of malignant neoplasm of breast: Secondary | ICD-10-CM

## 2013-07-18 DIAGNOSIS — E119 Type 2 diabetes mellitus without complications: Secondary | ICD-10-CM | POA: Diagnosis present

## 2013-07-18 DIAGNOSIS — J9819 Other pulmonary collapse: Secondary | ICD-10-CM | POA: Diagnosis not present

## 2013-07-18 DIAGNOSIS — J90 Pleural effusion, not elsewhere classified: Secondary | ICD-10-CM | POA: Diagnosis not present

## 2013-07-18 DIAGNOSIS — N182 Chronic kidney disease, stage 2 (mild): Secondary | ICD-10-CM | POA: Diagnosis present

## 2013-07-18 DIAGNOSIS — E785 Hyperlipidemia, unspecified: Secondary | ICD-10-CM | POA: Diagnosis present

## 2013-07-18 DIAGNOSIS — I129 Hypertensive chronic kidney disease with stage 1 through stage 4 chronic kidney disease, or unspecified chronic kidney disease: Secondary | ICD-10-CM | POA: Diagnosis present

## 2013-07-18 DIAGNOSIS — K5900 Constipation, unspecified: Secondary | ICD-10-CM | POA: Diagnosis not present

## 2013-07-18 DIAGNOSIS — I080 Rheumatic disorders of both mitral and aortic valves: Principal | ICD-10-CM | POA: Diagnosis present

## 2013-07-18 DIAGNOSIS — Z7901 Long term (current) use of anticoagulants: Secondary | ICD-10-CM

## 2013-07-18 DIAGNOSIS — Z8673 Personal history of transient ischemic attack (TIA), and cerebral infarction without residual deficits: Secondary | ICD-10-CM

## 2013-07-18 DIAGNOSIS — I059 Rheumatic mitral valve disease, unspecified: Secondary | ICD-10-CM

## 2013-07-18 DIAGNOSIS — D62 Acute posthemorrhagic anemia: Secondary | ICD-10-CM | POA: Diagnosis not present

## 2013-07-18 HISTORY — PX: AORTIC VALVE REPLACEMENT: SHX41

## 2013-07-18 HISTORY — PX: INTRAOPERATIVE TRANSESOPHAGEAL ECHOCARDIOGRAM: SHX5062

## 2013-07-18 HISTORY — PX: MITRAL VALVE REPLACEMENT: SHX147

## 2013-07-18 HISTORY — DX: Hypo-osmolality and hyponatremia: E87.1

## 2013-07-18 HISTORY — DX: Unspecified atrial fibrillation: I48.91

## 2013-07-18 LAB — PROTIME-INR
INR: 1.51 — ABNORMAL HIGH (ref 0.00–1.49)
Prothrombin Time: 17.8 seconds — ABNORMAL HIGH (ref 11.6–15.2)

## 2013-07-18 LAB — POCT I-STAT 3, ART BLOOD GAS (G3+)
Acid-Base Excess: 5 mmol/L — ABNORMAL HIGH (ref 0.0–2.0)
Acid-base deficit: 2 mmol/L (ref 0.0–2.0)
Bicarbonate: 29.4 mEq/L — ABNORMAL HIGH (ref 20.0–24.0)
Bicarbonate: 23.3 mEq/L (ref 20.0–24.0)
O2 Saturation: 100 %
O2 Saturation: 100 %
Patient temperature: 35.3
pCO2 arterial: 38.4 mmHg (ref 35.0–45.0)
pCO2 arterial: 37 mmHg (ref 35.0–45.0)
pH, Arterial: 7.399 (ref 7.350–7.450)
pO2, Arterial: 55 mmHg — ABNORMAL LOW (ref 80.0–100.0)

## 2013-07-18 LAB — POCT I-STAT 4, (NA,K, GLUC, HGB,HCT)
Glucose, Bld: 121 mg/dL — ABNORMAL HIGH (ref 70–99)
Glucose, Bld: 115 mg/dL — ABNORMAL HIGH (ref 70–99)
Glucose, Bld: 124 mg/dL — ABNORMAL HIGH (ref 70–99)
Glucose, Bld: 111 mg/dL — ABNORMAL HIGH (ref 70–99)
Glucose, Bld: 113 mg/dL — ABNORMAL HIGH (ref 70–99)
Glucose, Bld: 132 mg/dL — ABNORMAL HIGH (ref 70–99)
HCT: 21 % — ABNORMAL LOW (ref 36.0–46.0)
HCT: 26 % — ABNORMAL LOW (ref 36.0–46.0)
HCT: 27 % — ABNORMAL LOW (ref 36.0–46.0)
HCT: 43 % (ref 36.0–46.0)
Hemoglobin: 9.9 g/dL — ABNORMAL LOW (ref 12.0–15.0)
Hemoglobin: 8.8 g/dL — ABNORMAL LOW (ref 12.0–15.0)
Hemoglobin: 9.2 g/dL — ABNORMAL LOW (ref 12.0–15.0)
Hemoglobin: 14.6 g/dL (ref 12.0–15.0)
Potassium: 3.5 mEq/L (ref 3.5–5.1)
Potassium: 4.5 mEq/L (ref 3.5–5.1)
Potassium: 4.6 mEq/L (ref 3.5–5.1)
Potassium: 5 mEq/L (ref 3.5–5.1)
Potassium: 4.1 mEq/L (ref 3.5–5.1)
Potassium: 3.1 mEq/L — ABNORMAL LOW (ref 3.5–5.1)
Sodium: 136 mEq/L (ref 135–145)
Sodium: 131 mEq/L — ABNORMAL LOW (ref 135–145)
Sodium: 134 mEq/L — ABNORMAL LOW (ref 135–145)
Sodium: 145 mEq/L (ref 135–145)

## 2013-07-18 LAB — POCT I-STAT, CHEM 8
BUN: 10 mg/dL (ref 6–23)
Calcium, Ion: 1.03 mmol/L — ABNORMAL LOW (ref 1.13–1.30)
HCT: 32 % — ABNORMAL LOW (ref 36.0–46.0)
Hemoglobin: 10.9 g/dL — ABNORMAL LOW (ref 12.0–15.0)
Sodium: 142 mEq/L (ref 135–145)
TCO2: 23 mmol/L (ref 0–100)

## 2013-07-18 LAB — GRAM STAIN

## 2013-07-18 LAB — PLATELET COUNT: Platelets: 39 10*3/uL — ABNORMAL LOW (ref 150–400)

## 2013-07-18 LAB — CBC
HCT: 32.4 % — ABNORMAL LOW (ref 36.0–46.0)
Hemoglobin: 12.3 g/dL (ref 12.0–15.0)
Hemoglobin: 11.2 g/dL — ABNORMAL LOW (ref 12.0–15.0)
MCH: 28.7 pg (ref 26.0–34.0)
MCH: 29 pg (ref 26.0–34.0)
MCV: 83.9 fL (ref 78.0–100.0)
Platelets: 121 10*3/uL — ABNORMAL LOW (ref 150–400)
RBC: 4.28 MIL/uL (ref 3.87–5.11)
RBC: 3.86 MIL/uL — ABNORMAL LOW (ref 3.87–5.11)
WBC: 15.1 10*3/uL — ABNORMAL HIGH (ref 4.0–10.5)

## 2013-07-18 LAB — CREATININE, SERUM: GFR calc Af Amer: 83 mL/min — ABNORMAL LOW (ref >90)

## 2013-07-18 LAB — ABO/RH: ABO/RH(D): A POS

## 2013-07-18 LAB — MAGNESIUM: Magnesium: 3.5 mg/dL — ABNORMAL HIGH (ref 1.5–2.5)

## 2013-07-18 LAB — HEMOGLOBIN AND HEMATOCRIT, BLOOD: Hemoglobin: 8.9 g/dL — ABNORMAL LOW (ref 12.0–15.0)

## 2013-07-18 LAB — APTT: aPTT: 35 seconds (ref 24–37)

## 2013-07-18 SURGERY — REPLACEMENT, AORTIC VALVE, OPEN
Anesthesia: General | Site: Chest | Wound class: Clean

## 2013-07-18 MED ORDER — SODIUM CHLORIDE 0.9 % IV SOLN
INTRAVENOUS | Status: DC
  Administered 2013-07-18: 4.6 [IU]/h via INTRAVENOUS
  Filled 2013-07-18 (×2): qty 1

## 2013-07-18 MED ORDER — LACTATED RINGERS IV SOLN
INTRAVENOUS | Status: DC | PRN
  Administered 2013-07-18: 08:00:00 via INTRAVENOUS

## 2013-07-18 MED ORDER — SODIUM CHLORIDE 0.9 % IJ SOLN: 3.0000 mL | INTRAMUSCULAR | Status: DC | PRN

## 2013-07-18 MED ORDER — SODIUM CHLORIDE 0.45 % IV SOLN
INTRAVENOUS | Status: DC
  Administered 2013-07-18: 15:00:00 via INTRAVENOUS

## 2013-07-18 MED ORDER — ARTIFICIAL TEARS OP OINT
TOPICAL_OINTMENT | OPHTHALMIC | Status: DC | PRN
  Administered 2013-07-18: 1 via OPHTHALMIC

## 2013-07-18 MED ORDER — ASPIRIN EC 325 MG PO TBEC
325.0000 mg | DELAYED_RELEASE_TABLET | Freq: Every day | ORAL | Status: DC
  Administered 2013-07-19: 325 mg via ORAL
  Filled 2013-07-18 (×2): qty 1

## 2013-07-18 MED ORDER — ALBUMIN HUMAN 5 % IV SOLN
250.0000 mL | INTRAVENOUS | Status: AC | PRN
  Administered 2013-07-18: 250 mL via INTRAVENOUS

## 2013-07-18 MED ORDER — BUPROPION HCL ER (XL) 150 MG PO TB24
150.0000 mg | ORAL_TABLET | Freq: Every day | ORAL | Status: DC
  Administered 2013-07-19 – 2013-08-03 (×16): 150 mg via ORAL
  Filled 2013-07-18 (×16): qty 1

## 2013-07-18 MED ORDER — LACTATED RINGERS IV SOLN
INTRAVENOUS | Status: DC | PRN
  Administered 2013-07-18 (×2): via INTRAVENOUS

## 2013-07-18 MED ORDER — METOCLOPRAMIDE HCL 5 MG/ML IJ SOLN
10.0000 mg | Freq: Four times a day (QID) | INTRAMUSCULAR | Status: AC
  Administered 2013-07-18 – 2013-07-19 (×4): 10 mg via INTRAVENOUS
  Filled 2013-07-18 (×4): qty 2

## 2013-07-18 MED ORDER — GELATIN ABSORBABLE MT POWD
OROMUCOSAL | Status: DC | PRN
  Administered 2013-07-18: 08:00:00 via TOPICAL

## 2013-07-18 MED ORDER — ASPIRIN 81 MG PO CHEW
324.0000 mg | CHEWABLE_TABLET | Freq: Every day | ORAL | Status: DC
  Filled 2013-07-18: qty 4

## 2013-07-18 MED ORDER — ACETAMINOPHEN 500 MG PO TABS
1000.0000 mg | ORAL_TABLET | Freq: Four times a day (QID) | ORAL | Status: DC
  Administered 2013-07-19 – 2013-07-21 (×8): 1000 mg via ORAL
  Filled 2013-07-18 (×12): qty 2

## 2013-07-18 MED ORDER — THROMBIN 20000 UNITS EX SOLR
CUTANEOUS | Status: AC
  Filled 2013-07-18: qty 20000

## 2013-07-18 MED ORDER — LACTATED RINGERS IV SOLN: 500.0000 mL | Freq: Once | INTRAVENOUS | Status: AC | PRN

## 2013-07-18 MED ORDER — ATORVASTATIN CALCIUM 20 MG PO TABS
20.0000 mg | ORAL_TABLET | Freq: Every day | ORAL | Status: DC
  Administered 2013-07-19 – 2013-08-03 (×16): 20 mg via ORAL
  Filled 2013-07-18 (×16): qty 1

## 2013-07-18 MED ORDER — BISACODYL 5 MG PO TBEC
10.0000 mg | DELAYED_RELEASE_TABLET | Freq: Every day | ORAL | Status: DC
  Administered 2013-07-19 – 2013-07-21 (×3): 10 mg via ORAL
  Filled 2013-07-18 (×3): qty 2

## 2013-07-18 MED ORDER — DOPAMINE-DEXTROSE 3.2-5 MG/ML-% IV SOLN: 0.0000 ug/kg/min | INTRAVENOUS | Status: DC

## 2013-07-18 MED ORDER — HEMOSTATIC AGENTS (NO CHARGE) OPTIME
TOPICAL | Status: DC | PRN
  Administered 2013-07-18: 1 via TOPICAL

## 2013-07-18 MED ORDER — ACETAMINOPHEN 160 MG/5ML PO SOLN: 650.0000 mg | Freq: Once | ORAL | Status: AC

## 2013-07-18 MED ORDER — NITROGLYCERIN IN D5W 200-5 MCG/ML-% IV SOLN: 0.0000 ug/min | INTRAVENOUS | Status: DC

## 2013-07-18 MED ORDER — METOPROLOL TARTRATE 12.5 MG HALF TABLET
12.5000 mg | ORAL_TABLET | Freq: Two times a day (BID) | ORAL | Status: DC
  Administered 2013-07-19 – 2013-07-21 (×5): 12.5 mg via ORAL
  Filled 2013-07-18 (×7): qty 1

## 2013-07-18 MED ORDER — MUPIROCIN 2 % EX OINT
TOPICAL_OINTMENT | Freq: Two times a day (BID) | CUTANEOUS | Status: AC
  Administered 2013-07-19 – 2013-07-22 (×8): via TOPICAL
  Filled 2013-07-18: qty 22

## 2013-07-18 MED ORDER — SODIUM CHLORIDE 0.9 % IV SOLN: INTRAVENOUS | Status: DC

## 2013-07-18 MED ORDER — ACETAMINOPHEN 160 MG/5ML PO SOLN
1000.0000 mg | Freq: Four times a day (QID) | ORAL | Status: DC
  Administered 2013-07-19 (×2): 1000 mg
  Filled 2013-07-18 (×2): qty 40.6

## 2013-07-18 MED ORDER — FLUTICASONE PROPIONATE HFA 44 MCG/ACT IN AERO
2.0000 | INHALATION_SPRAY | Freq: Two times a day (BID) | RESPIRATORY_TRACT | Status: DC
  Administered 2013-07-19 – 2013-08-03 (×28): 2 via RESPIRATORY_TRACT
  Filled 2013-07-18 (×3): qty 10.6

## 2013-07-18 MED ORDER — METOPROLOL TARTRATE 25 MG/10 ML ORAL SUSPENSION
12.5000 mg | Freq: Two times a day (BID) | ORAL | Status: DC
  Filled 2013-07-18 (×7): qty 5

## 2013-07-18 MED ORDER — HEPARIN SODIUM (PORCINE) 1000 UNIT/ML IJ SOLN
INTRAMUSCULAR | Status: DC | PRN
  Administered 2013-07-18: 20000 [IU] via INTRAVENOUS

## 2013-07-18 MED ORDER — PANTOPRAZOLE SODIUM 40 MG PO TBEC
40.0000 mg | DELAYED_RELEASE_TABLET | Freq: Every day | ORAL | Status: DC
  Administered 2013-07-20 – 2013-07-21 (×2): 40 mg via ORAL
  Filled 2013-07-18 (×2): qty 1

## 2013-07-18 MED ORDER — VECURONIUM BROMIDE 10 MG IV SOLR
INTRAVENOUS | Status: DC | PRN
  Administered 2013-07-18 (×4): 5 mg via INTRAVENOUS

## 2013-07-18 MED ORDER — MORPHINE SULFATE 2 MG/ML IJ SOLN: 1.0000 mg | INTRAMUSCULAR | Status: AC | PRN

## 2013-07-18 MED ORDER — OXYCODONE HCL 5 MG PO TABS
5.0000 mg | ORAL_TABLET | ORAL | Status: DC | PRN
  Administered 2013-07-19: 5 mg via ORAL
  Administered 2013-07-19: 10 mg via ORAL
  Administered 2013-07-19: 5 mg via ORAL
  Filled 2013-07-18: qty 2
  Filled 2013-07-18 (×2): qty 1

## 2013-07-18 MED ORDER — LACTATED RINGERS IV SOLN: INTRAVENOUS | Status: DC

## 2013-07-18 MED ORDER — LEVOTHYROXINE SODIUM 112 MCG PO TABS
112.0000 ug | ORAL_TABLET | Freq: Every day | ORAL | Status: DC
  Administered 2013-07-19 – 2013-07-26 (×8): 112 ug via ORAL
  Filled 2013-07-18 (×12): qty 1

## 2013-07-18 MED ORDER — VANCOMYCIN HCL IN DEXTROSE 1-5 GM/200ML-% IV SOLN
1000.0000 mg | Freq: Once | INTRAVENOUS | Status: AC
  Administered 2013-07-18: 1000 mg via INTRAVENOUS
  Filled 2013-07-18: qty 200

## 2013-07-18 MED ORDER — ADULT MULTIVITAMIN W/MINERALS CH
1.0000 | ORAL_TABLET | Freq: Every day | ORAL | Status: DC
  Administered 2013-07-19 – 2013-08-03 (×16): 1 via ORAL
  Filled 2013-07-18 (×16): qty 1

## 2013-07-18 MED ORDER — 0.9 % SODIUM CHLORIDE (POUR BTL) OPTIME
TOPICAL | Status: DC | PRN
  Administered 2013-07-18: 1000 mL

## 2013-07-18 MED ORDER — POTASSIUM CHLORIDE 10 MEQ/50ML IV SOLN
10.0000 meq | INTRAVENOUS | Status: AC
  Administered 2013-07-18 (×2): 10 meq via INTRAVENOUS

## 2013-07-18 MED ORDER — SODIUM CHLORIDE 0.9 % IJ SOLN
3.0000 mL | Freq: Two times a day (BID) | INTRAMUSCULAR | Status: DC
  Administered 2013-07-19 – 2013-07-20 (×2): 3 mL via INTRAVENOUS

## 2013-07-18 MED ORDER — LACTATED RINGERS IV SOLN
INTRAVENOUS | Status: DC | PRN
  Administered 2013-07-18 (×2): via INTRAVENOUS

## 2013-07-18 MED ORDER — SODIUM CHLORIDE 0.9 % IV SOLN
250.0000 mL | INTRAVENOUS | Status: DC
  Administered 2013-07-19: 250 mL via INTRAVENOUS

## 2013-07-18 MED ORDER — PROPOFOL 10 MG/ML IV BOLUS
INTRAVENOUS | Status: DC | PRN
  Administered 2013-07-18: 100 mg via INTRAVENOUS

## 2013-07-18 MED ORDER — POTASSIUM CHLORIDE 10 MEQ/50ML IV SOLN
10.0000 meq | INTRAVENOUS | Status: AC
  Administered 2013-07-18 – 2013-07-19 (×3): 10 meq via INTRAVENOUS

## 2013-07-18 MED ORDER — MAGNESIUM SULFATE 40 MG/ML IJ SOLN
4.0000 g | Freq: Once | INTRAMUSCULAR | Status: AC
  Administered 2013-07-18: 4 g via INTRAVENOUS
  Filled 2013-07-18: qty 100

## 2013-07-18 MED ORDER — INSULIN REGULAR BOLUS VIA INFUSION
0.0000 [IU] | Freq: Three times a day (TID) | INTRAVENOUS | Status: DC
  Filled 2013-07-18: qty 10

## 2013-07-18 MED ORDER — SACCHAROMYCES BOULARDII 250 MG PO CAPS
250.0000 mg | ORAL_CAPSULE | Freq: Every day | ORAL | Status: DC
  Administered 2013-07-19 – 2013-08-03 (×16): 250 mg via ORAL
  Filled 2013-07-18 (×16): qty 1

## 2013-07-18 MED ORDER — DOCUSATE SODIUM 100 MG PO CAPS
200.0000 mg | ORAL_CAPSULE | Freq: Every day | ORAL | Status: DC
  Administered 2013-07-19 – 2013-07-21 (×3): 200 mg via ORAL
  Filled 2013-07-18 (×3): qty 2

## 2013-07-18 MED ORDER — MIDAZOLAM HCL 5 MG/5ML IJ SOLN
INTRAMUSCULAR | Status: DC | PRN
  Administered 2013-07-18 (×3): 2 mg via INTRAVENOUS
  Administered 2013-07-18: 4 mg via INTRAVENOUS
  Administered 2013-07-18 (×3): 2 mg via INTRAVENOUS

## 2013-07-18 MED ORDER — FAMOTIDINE IN NACL 20-0.9 MG/50ML-% IV SOLN
20.0000 mg | Freq: Two times a day (BID) | INTRAVENOUS | Status: AC
  Administered 2013-07-18 (×2): 20 mg via INTRAVENOUS
  Filled 2013-07-18: qty 50

## 2013-07-18 MED ORDER — MIDAZOLAM HCL 2 MG/2ML IJ SOLN: 2.0000 mg | INTRAMUSCULAR | Status: DC | PRN

## 2013-07-18 MED ORDER — POTASSIUM CHLORIDE 10 MEQ/50ML IV SOLN: 10.0000 meq | INTRAVENOUS | Status: DC

## 2013-07-18 MED ORDER — MORPHINE SULFATE 2 MG/ML IJ SOLN
2.0000 mg | INTRAMUSCULAR | Status: DC | PRN
  Administered 2013-07-19 – 2013-07-20 (×3): 2 mg via INTRAVENOUS
  Administered 2013-07-20: 4 mg via INTRAVENOUS
  Administered 2013-07-20: 2 mg via INTRAVENOUS
  Administered 2013-07-21 (×2): 4 mg via INTRAVENOUS
  Filled 2013-07-18: qty 1
  Filled 2013-07-18 (×3): qty 2
  Filled 2013-07-18 (×3): qty 1

## 2013-07-18 MED ORDER — LIDOCAINE HCL (CARDIAC) 20 MG/ML IV SOLN
INTRAVENOUS | Status: DC | PRN
  Administered 2013-07-18: 50 mg via INTRAVENOUS
  Administered 2013-07-18: 250 mg via INTRAVENOUS

## 2013-07-18 MED ORDER — ACETAMINOPHEN 650 MG RE SUPP
650.0000 mg | Freq: Once | RECTAL | Status: AC
  Administered 2013-07-18: 650 mg via RECTAL

## 2013-07-18 MED ORDER — ONDANSETRON HCL 4 MG/2ML IJ SOLN
4.0000 mg | Freq: Four times a day (QID) | INTRAMUSCULAR | Status: DC | PRN
  Administered 2013-07-20: 4 mg via INTRAVENOUS
  Filled 2013-07-18: qty 2

## 2013-07-18 MED ORDER — METOPROLOL TARTRATE 1 MG/ML IV SOLN: 2.5000 mg | INTRAVENOUS | Status: DC | PRN

## 2013-07-18 MED ORDER — ROCURONIUM BROMIDE 100 MG/10ML IV SOLN
INTRAVENOUS | Status: DC | PRN
  Administered 2013-07-18 (×2): 50 mg via INTRAVENOUS

## 2013-07-18 MED ORDER — FENTANYL CITRATE 0.05 MG/ML IJ SOLN
INTRAMUSCULAR | Status: DC | PRN
  Administered 2013-07-18 (×3): 100 ug via INTRAVENOUS
  Administered 2013-07-18: 150 ug via INTRAVENOUS
  Administered 2013-07-18 (×2): 250 ug via INTRAVENOUS
  Administered 2013-07-18: 100 ug via INTRAVENOUS
  Administered 2013-07-18 (×2): 150 ug via INTRAVENOUS
  Administered 2013-07-18: 250 ug via INTRAVENOUS
  Administered 2013-07-18: 200 ug via INTRAVENOUS
  Administered 2013-07-18: 50 ug via INTRAVENOUS

## 2013-07-18 MED ORDER — DEXTROSE 5 % IV SOLN
0.0000 ug/min | INTRAVENOUS | Status: DC
  Filled 2013-07-18: qty 2

## 2013-07-18 MED ORDER — DEXMEDETOMIDINE HCL IN NACL 200 MCG/50ML IV SOLN: 0.1000 ug/kg/h | INTRAVENOUS | Status: DC

## 2013-07-18 MED ORDER — THROMBIN 20000 UNITS EX KIT
PACK | CUTANEOUS | Status: AC
  Filled 2013-07-18: qty 1

## 2013-07-18 MED ORDER — DEXTROSE 5 % IV SOLN
1.5000 g | Freq: Two times a day (BID) | INTRAVENOUS | Status: AC
  Administered 2013-07-18 – 2013-07-20 (×4): 1.5 g via INTRAVENOUS
  Filled 2013-07-18 (×4): qty 1.5

## 2013-07-18 MED ORDER — PROTAMINE SULFATE 10 MG/ML IV SOLN
INTRAVENOUS | Status: DC | PRN
  Administered 2013-07-18 (×6): 25 mg via INTRAVENOUS
  Administered 2013-07-18: 50 mg via INTRAVENOUS

## 2013-07-18 MED ORDER — BISACODYL 10 MG RE SUPP: 10.0000 mg | Freq: Every day | RECTAL | Status: DC

## 2013-07-18 MED ORDER — SODIUM CHLORIDE 0.9 % IV SOLN
INTRAVENOUS | Status: DC | PRN
  Administered 2013-07-18: 14:00:00 via INTRAVENOUS

## 2013-07-18 MED FILL — Lidocaine HCl IV Inj 20 MG/ML: INTRAVENOUS | Qty: 5 | Status: AC

## 2013-07-18 MED FILL — Electrolyte-R (PH 7.4) Solution: INTRAVENOUS | Qty: 6000 | Status: AC

## 2013-07-18 MED FILL — Sodium Chloride Irrigation Soln 0.9%: Qty: 3000 | Status: AC

## 2013-07-18 MED FILL — Mannitol IV Soln 20%: INTRAVENOUS | Qty: 500 | Status: AC

## 2013-07-18 MED FILL — Heparin Sodium (Porcine) Inj 1000 Unit/ML: INTRAMUSCULAR | Qty: 10 | Status: AC

## 2013-07-18 MED FILL — Sodium Bicarbonate IV Soln 8.4%: INTRAVENOUS | Qty: 50 | Status: AC

## 2013-07-18 SURGICAL SUPPLY — 82 items
ADAPTER CARDIO PERF ANTE/RETRO (ADAPTER) ×2 IMPLANT
ATTRACTOMAT 16X20 MAGNETIC DRP (DRAPES) ×2 IMPLANT
BAG DECANTER FOR FLEXI CONT (MISCELLANEOUS) ×2 IMPLANT
BLADE STERNUM SYSTEM 6 (BLADE) ×2 IMPLANT
BLADE SURG 15 STRL LF DISP TIS (BLADE) ×1 IMPLANT
BLADE SURG 15 STRL SS (BLADE) ×1
CANISTER SUCTION 2500CC (MISCELLANEOUS) ×2 IMPLANT
CANN PRFSN 3/8X14X24FR PCFC (MISCELLANEOUS) ×1
CANNULA GUNDRY RCSP 15FR (MISCELLANEOUS) ×2 IMPLANT
CANNULA PRFSN 3/8X14X24FR PCFC (MISCELLANEOUS) ×1 IMPLANT
CANNULA VEN MTL TIP RT (MISCELLANEOUS) ×1
CANNULA VRC MALB SNGL STG 34FR (MISCELLANEOUS) ×1 IMPLANT
CATH ROBINSON RED A/P 18FR (CATHETERS) ×10 IMPLANT
CATH THORACIC 36FR (CATHETERS) ×2 IMPLANT
CATH THORACIC 36FR RT ANG (CATHETERS) ×2 IMPLANT
CONN 1/2X1/2X1/2  BEN (MISCELLANEOUS) ×1
CONN 1/2X1/2X1/2 BEN (MISCELLANEOUS) ×1 IMPLANT
CONN 3/8X1/2 ST GISH (MISCELLANEOUS) ×4 IMPLANT
CONT SPEC 4OZ CLIKSEAL STRL BL (MISCELLANEOUS) ×6 IMPLANT
CONT SPEC STER OR (MISCELLANEOUS) ×2 IMPLANT
COVER SURGICAL LIGHT HANDLE (MISCELLANEOUS) ×2 IMPLANT
CRADLE DONUT ADULT HEAD (MISCELLANEOUS) ×2 IMPLANT
DRAPE CARDIOVASCULAR INCISE (DRAPES) ×1
DRAPE SLUSH/WARMER DISC (DRAPES) IMPLANT
DRAPE SRG 135X102X78XABS (DRAPES) ×1 IMPLANT
DRSG COVADERM 4X14 (GAUZE/BANDAGES/DRESSINGS) ×2 IMPLANT
ELECT CAUTERY BLADE 6.4 (BLADE) ×2 IMPLANT
ELECT REM PT RETURN 9FT ADLT (ELECTROSURGICAL) ×4
ELECTRODE REM PT RTRN 9FT ADLT (ELECTROSURGICAL) ×2 IMPLANT
GLOVE BIO SURGEON STRL SZ 6 (GLOVE) ×16 IMPLANT
GLOVE BIO SURGEON STRL SZ 6.5 (GLOVE) ×8 IMPLANT
GLOVE BIO SURGEON STRL SZ7 (GLOVE) IMPLANT
GLOVE BIO SURGEON STRL SZ7.5 (GLOVE) IMPLANT
GLOVE EUDERMIC 7 POWDERFREE (GLOVE) ×4 IMPLANT
GOWN PREVENTION PLUS XLARGE (GOWN DISPOSABLE) ×2 IMPLANT
GOWN STRL NON-REIN LRG LVL3 (GOWN DISPOSABLE) ×22 IMPLANT
HEART VENT LT CURVED (MISCELLANEOUS) ×2 IMPLANT
HEMOSTAT POWDER SURGIFOAM 1G (HEMOSTASIS) ×6 IMPLANT
HEMOSTAT SURGICEL 2X14 (HEMOSTASIS) ×4 IMPLANT
KIT BASIN OR (CUSTOM PROCEDURE TRAY) ×2 IMPLANT
KIT CATH CPB BARTLE (MISCELLANEOUS) ×2 IMPLANT
KIT ROOM TURNOVER OR (KITS) ×2 IMPLANT
KIT SUCTION CATH 14FR (SUCTIONS) ×2 IMPLANT
LEAD PACING MYOCARDI (MISCELLANEOUS) ×2 IMPLANT
LINE VENT (MISCELLANEOUS) ×2 IMPLANT
LOOP VESSEL SUPERMAXI WHITE (MISCELLANEOUS) ×4 IMPLANT
NS IRRIG 1000ML POUR BTL (IV SOLUTION) ×12 IMPLANT
PACK OPEN HEART (CUSTOM PROCEDURE TRAY) ×2 IMPLANT
PAD ARMBOARD 7.5X6 YLW CONV (MISCELLANEOUS) ×4 IMPLANT
SET CARDIOPLEGIA MPS 5001102 (MISCELLANEOUS) ×2 IMPLANT
SPONGE GAUZE 4X4 12PLY (GAUZE/BANDAGES/DRESSINGS) ×2 IMPLANT
SUCKER INTRACARDIAC WEIGHTED (SUCKER) ×2 IMPLANT
SUCKER WEIGHTED FLEX (MISCELLANEOUS) ×2 IMPLANT
SUT BONE WAX W31G (SUTURE) ×2 IMPLANT
SUT ETHIBON 2 0 V 52N 30 (SUTURE) ×4 IMPLANT
SUT ETHIBOND 2 0 SH (SUTURE) ×5 IMPLANT
SUT ETHIBOND 2 0 SH 36X2 (SUTURE) ×1 IMPLANT
SUT ETHIBOND 2 0 V4 (SUTURE) IMPLANT
SUT ETHIBOND 2 0V4 GREEN (SUTURE) IMPLANT
SUT PROLENE 3 0 SH 1 (SUTURE) ×2 IMPLANT
SUT PROLENE 3 0 SH DA (SUTURE) IMPLANT
SUT PROLENE 3 0 SH1 36 (SUTURE) ×6 IMPLANT
SUT PROLENE 4 0 RB 1 (SUTURE) ×4
SUT PROLENE 4-0 RB1 .5 CRCL 36 (SUTURE) ×4 IMPLANT
SUT SILK  1 MH (SUTURE) ×1
SUT SILK 1 MH (SUTURE) ×1 IMPLANT
SUT SILK 2 0 SH CR/8 (SUTURE) ×2 IMPLANT
SUT STEEL 6MS V (SUTURE) ×2 IMPLANT
SUT STEEL SZ 6 DBL 3X14 BALL (SUTURE) ×2 IMPLANT
SUT VIC AB 1 CTX 36 (SUTURE) ×2
SUT VIC AB 1 CTX36XBRD ANBCTR (SUTURE) ×2 IMPLANT
SUTURE E-PAK OPEN HEART (SUTURE) ×2 IMPLANT
SYSTEM SAHARA CHEST DRAIN ATS (WOUND CARE) ×2 IMPLANT
TAPE CLOTH SURG 4X10 WHT LF (GAUZE/BANDAGES/DRESSINGS) ×2 IMPLANT
TOWEL OR 17X24 6PK STRL BLUE (TOWEL DISPOSABLE) ×4 IMPLANT
TOWEL OR 17X26 10 PK STRL BLUE (TOWEL DISPOSABLE) ×4 IMPLANT
TRAY FOLEY IC TEMP SENS 14FR (CATHETERS) ×2 IMPLANT
UNDERPAD 30X30 INCONTINENT (UNDERPADS AND DIAPERS) ×2 IMPLANT
VALVE MAGNA EASE 21MM (Prosthesis & Implant Heart) ×2 IMPLANT
VALVE MAGNA MITRAL 25MM (Prosthesis & Implant Heart) ×2 IMPLANT
VRC MALLEABLE SINGLE STG 34FR (MISCELLANEOUS) ×2
WATER STERILE IRR 1000ML POUR (IV SOLUTION) ×4 IMPLANT

## 2013-07-18 NOTE — Interval H&P Note (Signed)
History and Physical Interval Note:  07/18/2013 7:58 AM  [name]  has presented today for surgery, with the diagnosis of MITRAL STENOSIS, AORTIC STENOSIS  The various methods of treatment have been discussed with the patient and family. After consideration of risks, benefits and other options for treatment, the patient has consented to  Procedure(s): AORTIC VALVE REPLACEMENT (AVR) (N/A) INTRAOPERATIVE TRANSESOPHAGEAL ECHOCARDIOGRAM (N/A) MITRAL VALVE REPAIR (MVR) (N/A) as a surgical intervention .  The patient's history has been reviewed, patient examined, no change in status, stable for surgery.  I have reviewed the patient's chart and labs.  Questions were answered to the patient's satisfaction.     [name]

## 2013-07-18 NOTE — Progress Notes (Signed)
Increased fio2 to 60% d/t abg po2 55, sats 91%.  RN aware, and will notify MD

## 2013-07-18 NOTE — Brief Op Note (Signed)
07/18/2013  1:44 PM  PATIENT:  [name]  67 y.o. female  PRE-OPERATIVE DIAGNOSIS:  1. MITRAL STENOSIS 2.AORTIC STENOSIS  POST-OPERATIVE DIAGNOSIS: 1. MITRAL STENOSIS 2.AORTIC STENOSIS  PROCEDURE:  INTRAOPERATIVE TRANSESOPHAGEAL ECHOCARDIOGRAM, MEDIAN STERNOTOMY AORTIC VALVE REPLACEMENT (AVR) (using a Magna Ease Pericardial Tissue Valve, size 21 mm) and MITRAL VALVE (MV) REPLACEMENT (using a Magna Ease Pericardial Tissue Valve, size 25 mm)  SURGEON:  Surgeon(s) and Role:    * [name], MD - Primary  PHYSICIAN ASSISTANT: Doree Fudge PA-C   ANESTHESIA:   general  EBL:  Total I/O In: 1000 [I.V.:1000] Out: 2450 [Urine:2450]  BLOOD ADMINISTERED:Two PLTS  DRAINS: Chest Tube(s) in the Mediastinal and pleural spaces   SPECIMEN:  Source of Specimen:  Native AV leaflets and MV leaflets  DISPOSITION OF SPECIMEN:  PATHOLOGY and culture  COUNTS CORRECT:  YES  DICTATION: .Dragon Dictation  PLAN OF CARE: Admit to inpatient   PATIENT DISPOSITION:  ICU - intubated and hemodynamically stable.   Delay start of Pharmacological VTE agent (>24hrs) due to surgical blood loss or risk of bleeding: yes  PRE OP WEIGHT: 76 kg

## 2013-07-18 NOTE — Progress Notes (Signed)
[facility] BRIEF SICU PROGRESS NOTE  Day of Surgery  S/P Procedure(s) (LRB): AORTIC VALVE REPLACEMENT (AVR) (N/A) INTRAOPERATIVE TRANSESOPHAGEAL ECHOCARDIOGRAM (N/A) MITRAL VALVE (MV) REPLACEMENT (N/A)   Sedated on vent AAI paced w/ stable hemodynamics Chest tube output low Excellent UOP Labs okay w/ Hgb 12.3  Plan: Continue routine early postop  [name] H 07/18/2013 5:49 PM

## 2013-07-18 NOTE — Preoperative (Signed)
Beta Blockers   Reason not to administer Beta Blockers:Not Applicable, took metoprolol this am 

## 2013-07-18 NOTE — Anesthesia Procedure Notes (Signed)
Procedure Name: Intubation Date/Time: 07/18/2013 8:31 AM Performed by: Margaree Mackintosh Pre-anesthesia Checklist: Patient identified, Timeout performed, Emergency Drugs available, Suction available and Patient being monitored Patient Re-evaluated:Patient Re-evaluated prior to inductionOxygen Delivery Method: Circle system utilized Preoxygenation: Pre-oxygenation with 100% oxygen Intubation Type: IV induction Ventilation: Mask ventilation without difficulty and Oral airway inserted - appropriate to patient size Laryngoscope Size: Mac and 3 Grade View: Grade III Tube type: Oral Tube size: 8.0 mm Number of attempts: 2 Airway Equipment and Method: Stylet and Video-laryngoscopy Placement Confirmation: ETT inserted through vocal cords under direct vision,  positive ETCO2 and breath sounds checked- equal and bilateral Secured at: 23 cm Tube secured with: Tape Dental Injury: Teeth and Oropharynx as per pre-operative assessment  Comments: DL x 1 Grade III view unable to easily pass oral ETT, elected to resume mask ventilation and utilize glidescope. Grade II view with glidescope

## 2013-07-18 NOTE — Transfer of Care (Signed)
Immediate Anesthesia Transfer of Care Note  Patient: [name]  Procedure(s) Performed: Procedure(s): AORTIC VALVE REPLACEMENT (AVR) (N/A) INTRAOPERATIVE TRANSESOPHAGEAL ECHOCARDIOGRAM (N/A) MITRAL VALVE (MV) REPLACEMENT (N/A)  Patient Location: ICU  Anesthesia Type:General  Level of Consciousness: sedated and unresponsive  Airway & Oxygen Therapy: Patient remains intubated per anesthesia plan and Patient placed on Ventilator (see vital sign flow sheet for setting)  Post-op Assessment: report given to ICU RN  Post vital signs: Reviewed and stable  Complications: No apparent anesthesia complications

## 2013-07-18 NOTE — H&P (Signed)
[address]       [name] [address]             [phone]      Cardiothoracic Surgery History and Physical   [name] [id number]   [name] is an 67 y.o. female.   HPI:   She has a history of presumed rheumatic valve disease status post digital mitral commisurotomy via left thoracotomy incision at age 41. She reports a couple year history of shortness of breath with significant exertion such as going up stairs. She presented with a three-week history of worsening shortness of breath with exertion and lower extremity edema. She said that she has recently been getting markedly short of breath walking on level ground. She's also had some chest discomfort associated with the shortness of breath. She was seen in the emergency room and found to be in pulmonary edema. A CT scan of the chest was negative for pulmonary embolism but did show some pulmonary edema. She improved with diuresis. She underwent cardiac catheterization and TEE as noted below.  Past Medical History   Diagnosis  Date   .  Rheumatic heart disease      mild AS/AI and mild MS/trivial MR; cardiac cath in 1993; chronic class II dyspnea on exertion   .  Gastroesophageal reflux disease    .  Hyperlipidemia    .  Hypertension      pt denies 05/30/13   .  Cardioembolic stroke  11/1306     Right frontal in 01/2012; normal carotid ultrasound; possible LAA thrombus by TEE; virtual complete neurologic recovery   .  Fasting hyperglycemia      120 fasting   .  Chronic kidney disease, stage 2, mildly decreased GFR      GFR of approximately 60   .  Breast carcinoma  1995     1995   .  Depression    .  Diverticulosis of colon (without mention of hemorrhage)  2012     Dr. Karilyn Cota   .  Gastroparesis    .  Hemorrhoids    .  Anemia     Past Surgical History   Procedure  Laterality  Date   .  Mitral valve surgery   [facility], closed mitral valvulotomy by finger fracture   .  Cardiac  catheterization     .  Dilation and curettage of uterus     .  Breast lumpectomy  Right  1995   .  Tubal ligation   1973   .  Tee without cardioversion   01/24/2012     Procedure: TRANSESOPHAGEAL ECHOCARDIOGRAM (TEE); Surgeon: Lewayne Bunting, MD; Location: [facility] ENDOSCOPY; Service: Cardiovascular; Laterality: N/A;   .  Colonoscopy   2012     Negative screening procedure   .  Esophageal manometry  N/A  06/17/2013     Procedure: ESOPHAGEAL MANOMETRY (EM); Surgeon: Mardella Layman, MD; Location: [facility] ENDOSCOPY; Service: Endoscopy; Laterality: N/A;    Family History   Problem  Relation  Age of Onset   .  Heart disease  Brother  50     MI   .  Rheum arthritis  Maternal Grandmother    .  Asthma  Maternal Grandfather    .  Hypothyroidism  Mother    .  Diabetes  Sister    .  Cirrhosis  Father    .  Cervical cancer  Sister  malignant neoplasm of cervix uteri   Social History: reports that she has never smoked. She has never used smokeless tobacco. She reports that she does not drink alcohol or use illicit drugs.  Allergies: No Known Allergies  Medications:  I have reviewed the patient's current medications.  Prior to Admission:  Prescriptions prior to admission   Medication  Sig  Dispense  Refill   .  albuterol (VENTOLIN HFA) 108 (90 BASE) MCG/ACT inhaler  Inhale 2 puffs into the lungs every 4 (four) hours as needed for wheezing.     Marland Kitchen  atorvastatin (LIPITOR) 10 MG tablet  Take 20 mg by mouth daily.     .  beclomethasone (QVAR) 80 MCG/ACT inhaler  Inhale 2 puffs into the lungs 2 (two) times daily.  1 Inhaler  6   .  buPROPion (WELLBUTRIN XL) 150 MG 24 hr tablet  Take 150 mg by mouth daily.     Marland Kitchen  dexlansoprazole (DEXILANT) 60 MG capsule  Take 1 capsule (60 mg total) by mouth daily.  90 capsule  3   .  hyoscyamine (LEVSIN SL) 0.125 MG SL tablet  Place 1 tablet (0.125 mg total) under the tongue every 4 (four) hours as needed for cramping.  30 tablet  0   .  levothyroxine (SYNTHROID,  LEVOTHROID) 112 MCG tablet  Take 112 mcg by mouth daily before breakfast.     .  losartan (COZAAR) 100 MG tablet  Take 50 mg by mouth daily.     .  Multiple Vitamin (MULITIVITAMIN WITH MINERALS) TABS  Take 1 tablet by mouth daily.     .  Rivaroxaban (XARELTO) 20 MG TABS  Take 1 tablet (20 mg total) by mouth daily with supper.  30 tablet  6   .  saccharomyces boulardii (FLORASTOR) 250 MG capsule  Take 250 mg by mouth daily.     .  sucralfate (CARAFATE) 1 GM/10ML suspension  Can take every 4 hours as needed  420 mL  2   Scheduled:  .  atorvastatin  20 mg  Oral  Daily   .  buPROPion  150 mg  Oral  Daily   .  fluticasone  1 puff  Inhalation  BID   .  furosemide  40 mg  Oral  Daily   .  levothyroxine  112 mcg  Oral  QAC breakfast   .  losartan  50 mg  Oral  Daily   .  metoprolol tartrate  25 mg  Oral  BID   .  pantoprazole  40 mg  Oral  Daily   .  sodium chloride  3 mL  Intravenous  Q12H   .  sucralfate  1 g  Oral  TID WC & HS   Continuous:  .  heparin  900 Units/hr (07/11/13 0245)   MVH:QIONGE chloride, acetaminophen, albuterol, guaiFENesin-dextromethorphan, HYDROcodone-homatropine, hyoscyamine, ondansetron (ZOFRAN) IV, sodium chloride  Anti-infectives    None      Results for orders placed during the hospital encounter of 07/06/13 (from the past 48 hour(s))   BASIC METABOLIC PANEL Status: Abnormal    Collection Time    07/10/13 7:05 AM   Result  Value  Range    Sodium  135  135 - 145 mEq/L    Potassium  4.7  3.5 - 5.1 mEq/L    Comment:  HEMOLYSIS AT THIS LEVEL MAY AFFECT RESULT    Chloride  100  96 - 112 mEq/L    CO2  25  19 - 32 mEq/L    Glucose, Bld  114 (*)  70 - 99 mg/dL    BUN  19  6 - 23 mg/dL    Creatinine, Ser  1.19  0.50 - 1.10 mg/dL    Calcium  8.9  8.4 - 10.5 mg/dL    GFR calc non Af Amer  53 (*)  >90 mL/min    GFR calc Af Amer  61 (*)  >90 mL/min    Comment:  (NOTE)     The eGFR has been calculated using the CKD EPI equation.     This calculation has not been  validated in all clinical situations.     eGFR's persistently <90 mL/min signify possible Chronic Kidney     Disease.   CBC Status: Abnormal    Collection Time    07/10/13 7:05 AM   Result  Value  Range    WBC  7.1  4.0 - 10.5 K/uL    RBC  4.48  3.87 - 5.11 MIL/uL    Hemoglobin  12.9  12.0 - 15.0 g/dL    HCT  14.7  82.9 - 56.2 %    MCV  87.7  78.0 - 100.0 fL    MCH  28.8  26.0 - 34.0 pg    MCHC  32.8  30.0 - 36.0 g/dL    RDW  13.0  86.5 - 78.4 %    Platelets  94 (*)  150 - 400 K/uL    Comment:  REPEATED TO VERIFY     PLATELET COUNT CONFIRMED BY SMEAR   POCT I-STAT 3, BLOOD GAS (G3+) Status: Abnormal    Collection Time    07/10/13 5:50 PM   Result  Value  Range    pH, Arterial  7.354  7.350 - 7.450    pCO2 arterial  48.6 (*)  35.0 - 45.0 mmHg    pO2, Arterial  75.0 (*)  80.0 - 100.0 mmHg    Bicarbonate  27.1 (*)  20.0 - 24.0 mEq/L    TCO2  29  0 - 100 mmol/L    O2 Saturation  94.0     Acid-Base Excess  1.0  0.0 - 2.0 mmol/L    Sample type  ARTERIAL    POCT I-STAT 3, BLOOD GAS (G3P V) Status: Abnormal    Collection Time    07/10/13 5:53 PM   Result  Value  Range    pH, Ven  7.300  7.250 - 7.300    pCO2, Ven  55.3 (*)  45.0 - 50.0 mmHg    pO2, Ven  34.0  30.0 - 45.0 mmHg    Bicarbonate  27.2 (*)  20.0 - 24.0 mEq/L    TCO2  29  0 - 100 mmol/L    O2 Saturation  58.0     Sample type  VENOUS     Comment  NOTIFIED PHYSICIAN    CBC Status: Abnormal    Collection Time    07/11/13 9:50 AM   Result  Value  Range    WBC  9.0  4.0 - 10.5 K/uL    RBC  4.56  3.87 - 5.11 MIL/uL    Hemoglobin  13.4  12.0 - 15.0 g/dL    HCT  69.6  29.5 - 28.4 %    MCV  86.6  78.0 - 100.0 fL    MCH  29.4  26.0 - 34.0 pg    MCHC  33.9  30.0 - 36.0 g/dL  RDW  14.1  11.5 - 15.5 %    Platelets  84 (*)  150 - 400 K/uL    Comment:  CONSISTENT WITH PREVIOUS RESULT   HEPARIN LEVEL (UNFRACTIONATED) Status: None    Collection Time    07/11/13 3:59 PM   Result  Value  Range    Heparin Unfractionated   0.31  0.30 - 0.70 IU/mL    Comment:      IF HEPARIN RESULTS ARE BELOW     EXPECTED VALUES, AND PATIENT     DOSAGE HAS BEEN CONFIRMED,     SUGGEST FOLLOW UP TESTING     OF ANTITHROMBIN III LEVELS.   No results found.  Review of Systems  Constitutional: Negative for fever, chills, weight loss and malaise/fatigue.  HENT: Negative.  Eyes: Negative.  Respiratory: Positive for cough and shortness of breath. Negative for hemoptysis and sputum production.  With minimal exertion  Cardiovascular: Positive for chest pain and leg swelling. Negative for orthopnea and PND.  Gastrointestinal: Negative.  Genitourinary: Negative.  Musculoskeletal: Negative.  Skin: Negative.  Neurological: Negative.  Short term memory loss after prior stroke.  Endo/Heme/Allergies: Negative.  Psychiatric/Behavioral: Negative.  Blood pressure 124/50, pulse 66, temperature 98 F (36.7 C), temperature source Oral, resp. rate 18, height 5\' 2"  (1.575 m), weight 74.934 kg (165 lb 3.2 oz), SpO2 97.00%.  Physical Exam  Constitutional: She is oriented to person, place, and time. She appears well-developed and well-nourished. No distress.  HENT:  Head: Normocephalic and atraumatic.  Mouth/Throat: Oropharynx is clear and moist.  Eyes: EOM are normal. Pupils are equal, round, and reactive to light.  Neck: Normal range of motion. Neck supple. No JVD present. No thyromegaly present.  Murmur or bruit heard on both sides of neck  Cardiovascular: Normal rate and regular rhythm.  Murmur heard. 3/6 systolic murmur over aorta  Respiratory: Effort normal and breath sounds normal. No respiratory distress. She has no rales.  Old left thoracotomy scar  GI: Soft. Bowel sounds are normal. She exhibits no distension and no mass. There is no tenderness.  Musculoskeletal: Normal range of motion. She exhibits no edema and no tenderness.  Lymphadenopathy:  She has no cervical adenopathy.  Neurological: She is alert and oriented to person,  place, and time. She has normal strength. No cranial nerve deficit or sensory deficit.  Skin: Skin is warm and dry.  Psychiatric: She has a normal mood and affect.  Cardiac Catheterization Operative Report  [name]  [id number]  10/8/20146:04 PM  Carylon Perches, MD  Procedure Performed:  1. Left Heart Catheterization 2. Selective Coronary Angiography 3. Right Heart Catheterization 4. Left ventricular pressures Operator: Verne Carrow, MD  Indication: 66 yo female with history of rheumatic fever, mitral stenosis and aortic stenosis who was admitted with CHF. Right and left heart cath to assess pressures and exclude CAD.  Procedure Details:  The risks, benefits, complications, treatment options, and expected outcomes were discussed with the patient. The patient and/or family concurred with the proposed plan, giving informed consent. The patient was brought to the cath lab after IV hydration was begun and oral premedication was given. The patient was further sedated with Versed and Fentanyl. The right groin was prepped and draped in the usual manner. Using the modified Seldinger access technique, a 5 French sheath was placed in the femoral artery. A 7 French sheath was inserted into the right femoral vein. A balloon tipped catheter was used to perform a right heart catheterization. Standard diagnostic catheters were used  to perform selective coronary angiography. A pigtail catheter was used to perform a left ventricular angiogram. There were no immediate complications. The patient was taken to the recovery area in stable condition.  Hemodynamic Findings:  Ao: 132/57  LV: 144/2/11  RA: 10  RV: 63/2/10  PA: 62/19 (mean 41)  PCWP: 19  Fick Cardiac Output: 3.73 L/min  Fick Cardiac Index: 2.11 L/min/m2  Central Aortic Saturation: 94%  Pulmonary Artery Saturation: 58%  Aortic valve: Peak to peak gradient 12 mm Hg. Valve easily crossed with J-wire.  Angiographic Findings:  Left main: No  obstructive disease.  Left Anterior Descending Artery: Large caliber vessel that courses to the apex. The mid and distal vessel becomes small in caliber. Small caliber diagonal branch. No obstructive disease.  Circumflex Artery: Large caliber vessel with two small obtuse marginal branches. No obstructive disease.  Right Coronary Artery: Large, dominant vessel with no obstructive disease.  Left Ventricular Angiogram: Deferred.  Impression:  1. No angiographic evidence of CAD  2. Elevated filling pressures  Recommendations: No further ischemic workup. Plans for TEE in am to better assess mitral and aortic valves.  Complications: None; patient tolerated the procedure well.  Transesophageal Echocardiogram Note  [name]  [id number]  Feb 21, 1946  Procedure: Transesophageal Echocardiogram  Indications: mitral stenosis  Procedure Details  Consent: Obtained  Time Out: Verified patient identification, verified procedure, site/side was marked, verified correct patient position, special equipment/implants available, Radiology Safety Procedures followed, medications/allergies/relevent history reviewed, required imaging and test results available. Performed  Medications:  Fentanyl: 25 mcg IV  Versed: 3 mg IV  Left Ventrical: Moderate LV dysfunction - EF 35-40%  Mitral Valve: markedly thickened, moderate - severe mitral stenosis,  Aortic Valve: moderately thickened, Moderately limited leaflet mobility. Mild - mod AI, moderate AS, Could not get a very good gradient  Tricuspid Valve: normal  Pulmonic Valve: normal  Left Atrium/ Left atrial appendage: LAA appears to be surgically obliterated . There was significant spontanous contrast in the LA.  Atrial septum: intact , no right to left left shunt by color or bubble study  Aorta: mild calcification  Complications: No apparent complications  Patient did tolerate procedure well.  Vesta Mixer, Montez Hageman., MD, [facility]  07/11/2013, 12:51 PM    Assessment/Plan:   She has moderate to severe mitral stenosis with moderate aortic stenosis and mild to moderate aortic insufficiency with progressive exertional dyspnea and pulmonary edema. Her aortic valve leaflets are thickened with decreased mobility and I think if she is going to have mitral valve replacement her aortic valve should be replaced at the same time. I would recommend using tissue valves given her age of 51. She said that she had been on Coumadin in the past but it was difficult to control her INR. She is currently on Xarelto following an embolic stroke. I discussed the pros and cons of mechanical and tissue valves and she is in agreement with using tissue valves. I told her that I would not be able to perform surgery until late next week due to a full operative schedule. I think she could go home on Xarelto and return for surgery next Thursday. The Xarelto would have to be stopped 3 days prior to surgery. I discussed the operative procedure with the patient and family including alternatives, benefits and risks; including but not limited to bleeding, blood transfusion, infection, stroke, myocardial infarction, graft failure, heart block requiring a permanent pacemaker, organ dysfunction, and death. [name] understands and agrees to proceed. We  will schedule surgery for next Thursday. If she goes home tomorrow she could take the Xarelto tomorrow and Sunday and stop it after Sunday.

## 2013-07-18 NOTE — Anesthesia Preprocedure Evaluation (Signed)
Anesthesia Evaluation  Patient identified by MRN, date of birth, ID band Patient awake    Reviewed: Allergy & Precautions, H&P , NPO status , Patient's Chart, lab work & pertinent test results  Airway Mallampati: II      Dental   Pulmonary shortness of breath, asthma ,          Cardiovascular hypertension, + Peripheral Vascular Disease     Neuro/Psych    GI/Hepatic GERD-  ,  Endo/Other  Hypothyroidism   Renal/GU Renal disease     Musculoskeletal   Abdominal   Peds  Hematology   Anesthesia Other Findings   Reproductive/Obstetrics                           Anesthesia Physical Anesthesia Plan  ASA: IV  Anesthesia Plan: General   Post-op Pain Management:    Induction: Intravenous  Airway Management Planned: Oral ETT  Additional Equipment:   Intra-op Plan:   Post-operative Plan: Post-operative intubation/ventilation  Informed Consent:   Plan Discussed with: CRNA and Anesthesiologist  Anesthesia Plan Comments:         Anesthesia Quick Evaluation

## 2013-07-18 NOTE — OR Nursing (Signed)
Sacral dressing applied per protocol. 

## 2013-07-19 ENCOUNTER — Encounter (HOSPITAL_COMMUNITY): Payer: Self-pay | Admitting: Surgery

## 2013-07-19 ENCOUNTER — Inpatient Hospital Stay (HOSPITAL_COMMUNITY): Payer: Medicare Other

## 2013-07-19 LAB — POCT I-STAT 3, ART BLOOD GAS (G3+)
Acid-base deficit: 2 mmol/L (ref 0.0–2.0)
Acid-base deficit: 3 mmol/L — ABNORMAL HIGH (ref 0.0–2.0)
Bicarbonate: 22.2 mEq/L (ref 20.0–24.0)
Bicarbonate: 23.9 mEq/L (ref 20.0–24.0)
Patient temperature: 36.7
Patient temperature: 36.7
TCO2: 25 mmol/L (ref 0–100)
pH, Arterial: 7.317 — ABNORMAL LOW (ref 7.350–7.450)
pH, Arterial: 7.357 (ref 7.350–7.450)
pO2, Arterial: 107 mmHg — ABNORMAL HIGH (ref 80.0–100.0)

## 2013-07-19 LAB — GLUCOSE, CAPILLARY
Glucose-Capillary: 100 mg/dL — ABNORMAL HIGH (ref 70–99)
Glucose-Capillary: 100 mg/dL — ABNORMAL HIGH (ref 70–99)
Glucose-Capillary: 151 mg/dL — ABNORMAL HIGH (ref 70–99)
Glucose-Capillary: 120 mg/dL — ABNORMAL HIGH (ref 70–99)
Glucose-Capillary: 101 mg/dL — ABNORMAL HIGH (ref 70–99)
Glucose-Capillary: 157 mg/dL — ABNORMAL HIGH (ref 70–99)
Glucose-Capillary: 133 mg/dL — ABNORMAL HIGH (ref 70–99)
Glucose-Capillary: 108 mg/dL — ABNORMAL HIGH (ref 70–99)
Glucose-Capillary: 101 mg/dL — ABNORMAL HIGH (ref 70–99)
Glucose-Capillary: 127 mg/dL — ABNORMAL HIGH (ref 70–99)
Glucose-Capillary: 125 mg/dL — ABNORMAL HIGH (ref 70–99)
Glucose-Capillary: 112 mg/dL — ABNORMAL HIGH (ref 70–99)
Glucose-Capillary: 87 mg/dL (ref 70–99)
Glucose-Capillary: 104 mg/dL — ABNORMAL HIGH (ref 70–99)
Glucose-Capillary: 96 mg/dL (ref 70–99)
Glucose-Capillary: 128 mg/dL — ABNORMAL HIGH (ref 70–99)
Glucose-Capillary: 138 mg/dL — ABNORMAL HIGH (ref 70–99)

## 2013-07-19 LAB — CBC
HCT: 32.7 % — ABNORMAL LOW (ref 36.0–46.0)
Hemoglobin: 11.4 g/dL — ABNORMAL LOW (ref 12.0–15.0)
MCHC: 33.5 g/dL (ref 30.0–36.0)
MCHC: 33 g/dL (ref 30.0–36.0)
Platelets: 134 10*3/uL — ABNORMAL LOW (ref 150–400)
Platelets: 112 10*3/uL — ABNORMAL LOW (ref 150–400)
RBC: 4 MIL/uL (ref 3.87–5.11)
RDW: 15.5 % (ref 11.5–15.5)
WBC: 12.8 10*3/uL — ABNORMAL HIGH (ref 4.0–10.5)
WBC: 12.9 10*3/uL — ABNORMAL HIGH (ref 4.0–10.5)

## 2013-07-19 LAB — POCT I-STAT, CHEM 8
BUN: 8 mg/dL (ref 6–23)
Calcium, Ion: 1.11 mmol/L — ABNORMAL LOW (ref 1.13–1.30)
Chloride: 104 mEq/L (ref 96–112)
Glucose, Bld: 145 mg/dL — ABNORMAL HIGH (ref 70–99)
HCT: 34 % — ABNORMAL LOW (ref 36.0–46.0)
Potassium: 3.4 mEq/L — ABNORMAL LOW (ref 3.5–5.1)
Sodium: 142 mEq/L (ref 135–145)
TCO2: 24 mmol/L (ref 0–100)

## 2013-07-19 LAB — CREATININE, SERUM
Creatinine, Ser: 0.91 mg/dL (ref 0.50–1.10)
GFR calc Af Amer: 73 mL/min — ABNORMAL LOW (ref >90)
GFR calc non Af Amer: 63 mL/min — ABNORMAL LOW (ref >90)

## 2013-07-19 LAB — BASIC METABOLIC PANEL
CO2: 24 mEq/L (ref 19–32)
Chloride: 111 mEq/L (ref 96–112)
Glucose, Bld: 116 mg/dL — ABNORMAL HIGH (ref 70–99)
Potassium: 4.1 mEq/L (ref 3.5–5.1)
Sodium: 143 mEq/L (ref 135–145)

## 2013-07-19 LAB — MAGNESIUM
Magnesium: 3 mg/dL — ABNORMAL HIGH (ref 1.5–2.5)
Magnesium: 2.5 mg/dL (ref 1.5–2.5)

## 2013-07-19 LAB — PREPARE PLATELET PHERESIS: Unit division: 0

## 2013-07-19 MED ORDER — SODIUM CHLORIDE 0.9 % IV SOLN
INTRAVENOUS | Status: DC
  Administered 2013-07-19: 22:00:00 via INTRAVENOUS

## 2013-07-19 MED ORDER — POTASSIUM CHLORIDE 10 MEQ/50ML IV SOLN
10.0000 meq | INTRAVENOUS | Status: AC
  Administered 2013-07-19 (×2): 10 meq via INTRAVENOUS

## 2013-07-19 MED ORDER — INSULIN DETEMIR 100 UNIT/ML ~~LOC~~ SOLN
20.0000 [IU] | Freq: Every day | SUBCUTANEOUS | Status: DC
  Filled 2013-07-19: qty 0.2

## 2013-07-19 MED ORDER — FUROSEMIDE 10 MG/ML IJ SOLN
40.0000 mg | Freq: Two times a day (BID) | INTRAMUSCULAR | Status: AC
  Administered 2013-07-19 (×2): 40 mg via INTRAVENOUS
  Filled 2013-07-19 (×2): qty 4

## 2013-07-19 MED ORDER — POTASSIUM CHLORIDE 10 MEQ/50ML IV SOLN
INTRAVENOUS | Status: AC
  Administered 2013-07-19: 10 meq
  Filled 2013-07-19: qty 150

## 2013-07-19 MED ORDER — INSULIN ASPART 100 UNIT/ML ~~LOC~~ SOLN
0.0000 [IU] | SUBCUTANEOUS | Status: DC
  Administered 2013-07-19 – 2013-07-20 (×5): 2 [IU] via SUBCUTANEOUS

## 2013-07-19 MED ORDER — INSULIN DETEMIR 100 UNIT/ML ~~LOC~~ SOLN
20.0000 [IU] | Freq: Once | SUBCUTANEOUS | Status: AC
  Administered 2013-07-19: 20 [IU] via SUBCUTANEOUS
  Filled 2013-07-19: qty 0.2

## 2013-07-19 MED ORDER — ENOXAPARIN SODIUM 40 MG/0.4ML ~~LOC~~ SOLN
40.0000 mg | Freq: Every day | SUBCUTANEOUS | Status: DC
  Administered 2013-07-19 – 2013-07-23 (×5): 40 mg via SUBCUTANEOUS
  Filled 2013-07-19 (×6): qty 0.4

## 2013-07-19 MED FILL — Magnesium Sulfate Inj 50%: INTRAMUSCULAR | Qty: 10 | Status: AC

## 2013-07-19 MED FILL — Sodium Chloride IV Soln 0.9%: INTRAVENOUS | Qty: 1000 | Status: AC

## 2013-07-19 MED FILL — Potassium Chloride Inj 2 mEq/ML: INTRAVENOUS | Qty: 40 | Status: AC

## 2013-07-19 MED FILL — Heparin Sodium (Porcine) Inj 1000 Unit/ML: INTRAMUSCULAR | Qty: 30 | Status: AC

## 2013-07-19 NOTE — Progress Notes (Signed)
Pt completed ventilator rapid wean per protocol. Pt drowsy and had to be prompted to breathe throughout wean. ABG at 0346 after 20 minutes of pressure support was pH 7.357, pCO2 39.5, pO2 107, HCO3 22.2, O2 sat 98. RT requested second ABG at 0427 prior to extubation. ABG results were pH 7.317, pCO2 46.4, pO2 104, HCO3 23.9, O2 sat 97. Pt put back on PRVC. Will try to wean vent again this AM.

## 2013-07-19 NOTE — Progress Notes (Signed)
Patient ID: [name], female   DOB: 1946/05/12, 67 y.o.   MRN: [id number]   SICU Evening Rounds:   Hemodynamically stable   Ambulated some today. More alert tonight.  Urine output good    CBC    Component Value Date/Time   WBC 12.9* 07/19/2013 1620   RBC 3.75* 07/19/2013 1620   HGB 10.8* 07/19/2013 1620   HCT 32.7* 07/19/2013 1620   PLT 112* 07/19/2013 1620   MCV 87.2 07/19/2013 1620   MCH 28.8 07/19/2013 1620   MCHC 33.0 07/19/2013 1620   RDW 15.5 07/19/2013 1620   LYMPHSABS 1.5 07/07/2013 0000   MONOABS 0.5 07/07/2013 0000   EOSABS 0.4 07/07/2013 0000   BASOSABS 0.0 07/07/2013 0000     BMET    Component Value Date/Time   NA 143 07/19/2013 0445   K 4.1 07/19/2013 0445   CL 111 07/19/2013 0445   CO2 24 07/19/2013 0445   GLUCOSE 116* 07/19/2013 0445   BUN 10 07/19/2013 0445   CREATININE 0.91 07/19/2013 1620   CREATININE 1.29* 05/01/2013 1550   CALCIUM 7.4* 07/19/2013 0445   GFRNONAA 63* 07/19/2013 1620   GFRAA 73* 07/19/2013 1620     A/P:  Stable postop course. Continue current plans

## 2013-07-19 NOTE — Progress Notes (Signed)
Inpatient Diabetes Program Recommendations  AACE/ADA: New Consensus Statement on Inpatient Glycemic Control (2013)  Target Ranges:  Prepandial:   less than 140 mg/dL      Peak postprandial:   less than 180 mg/dL (1-2 hours)      Critically ill patients:  140 - 180 mg/dL   Reason for Visit: Results for [name], [name] (MRN [id number]) as of 07/19/2013 08:45  Ref. Range 07/17/2013 09:30  Hemoglobin A1C Latest Range: <5.7 % 6.6 (H)   No history of diabetes noted, however A1C elevated greater than 6.5%.  Is this new onset diabetes?  MD please advise regarding diabetes education needs?  Beryl Meager, RN, BC-ADM Inpatient Diabetes Coordinator Pager [phone]

## 2013-07-19 NOTE — Op Note (Signed)
CARDIOVASCULAR SURGERY OPERATIVE NOTE  07/19/2013 [name] [id number]  Surgeon:  [name], MD  First Assistant: Doree Fudge, [facility]   Preoperative Diagnosis:  Severe mitral stenosis                                              Moderate to severe aortic stenosis, moderate aortic insufficiency  Postoperative Diagnosis:  Same   Procedure:  1. Median Sternotomy 2. Extracorporeal circulation 3.   Aortic valve replacement using a 21mm  Edwards  Magna-ease pericardial valve. 4.   Mitral valve replacement using a 25 mm  Edwards  Magna-ease pericardial valve.  Anesthesia:  General Endotracheal   Clinical History/Surgical Indication:  She has a history of presumed rheumatic valve disease status post digital mitral commisurotomy via left thoracotomy incision at age 66. She reports a couple year history of shortness of breath with significant exertion such as going up stairs. She presented with a three-week history of worsening shortness of breath with exertion and lower extremity edema. She said that she has recently been getting markedly short of breath walking on level ground. She's also had some chest discomfort associated with the shortness of breath. She was seen in the emergency room and found to be in pulmonary edema. A CT scan of the chest was negative for pulmonary embolism but did show some pulmonary edema. She improved with diuresis. She underwent cardiac catheterization and TEE.  She has moderate to severe mitral stenosis with moderate aortic stenosis and mild to moderate aortic insufficiency with progressive exertional dyspnea and pulmonary edema. Her aortic valve leaflets are thickened with decreased mobility and I think if she is going to have mitral valve replacement her aortic valve should be replaced at the same time. I would recommend using tissue valves given her age of 21. She said that she had been on Coumadin in the past but it was difficult to control her  INR. She is currently on Xarelto following an embolic stroke. I discussed the pros and cons of mechanical and tissue valves and she is in agreement with using tissue valves. I told her that I would not be able to perform surgery until late next week due to a full operative schedule. I think she could go home on Xarelto and return for surgery next Thursday. The Xarelto would have to be stopped 3 days prior to surgery. I discussed the operative procedure with the patient and family including alternatives, benefits and risks; including but not limited to bleeding, blood transfusion, infection, stroke, myocardial infarction, graft failure, heart block requiring a permanent pacemaker, organ dysfunction, and death. [name] understands and agrees to proceed.    Preparation:  The patient was seen in the preoperative holding area and the correct patient, correct operation were confirmed with the patient after reviewing the medical record and catheterization. The consent was signed by me. Preoperative antibiotics were given. A pulmonary arterial line and radial arterial line were placed by the anesthesia team. The patient was taken back to the operating room and positioned supine on the operating room table. After being placed under general endotracheal anesthesia by the anesthesia team a foley catheter was placed. The neck, chest, abdomen, and both legs were prepped with betadine soap and solution and draped in the usual sterile manner. A surgical time-out was taken and the correct patient and operative procedure were  confirmed with the nursing and anesthesia staff.   Pre-bypass TEE:   Complete TEE assessment was performed by Dr. Judie Petit. This showed a rheumatic mitral valve with severe stenosis. The aortic valve also appeared rheumatic with a gradient suggesting severe aortic stenosis. There was decreased leaflet mobility. There was moderate AI.    Post-bypass TEE:   Normal functioning  prosthetic aortic and mitrall valves with no perivalvular leak or regurgitation through the valve. Left ventricular function preserved.     Cardiopulmonary Bypass:  A median sternotomy was performed. The pericardium was opened in the midline. Right ventricular function appeared normal. The ascending aorta was of normal size and had no palpable plaque. There were no contraindications to aortic cannulation or cross-clamping. The patient was fully systemically heparinized and the ACT was maintained > 400 sec. The proximal aortic arch was cannulated with a 20 F aortic cannula for arterial inflow. Venous cannulation was performed via the right atrial appendage using a two-staged venous cannula. An antegrade cardioplegia/vent cannula was inserted into the mid-ascending aorta. A left ventricular vent was placed via the right superior pulmonary vein. A retrograde cardioplegia cannnula was placed into the coronary sinus via the right atrium. Aortic occlusion was performed with a single cross-clamp. Systemic cooling to 28 degrees Centigrade and topical cooling of the heart with iced saline were used. Hyperkalemic antegrade cold blood cardioplegia was used to induce diastolic arrest and then cold blood retrograde cardioplegia was given at about 20 minute intervals throughout the period of arrest to maintain myocardial temperature at or below 10 degrees centigrade. A temperature probe was inserted into the interventricular septum and an insulating pad was placed in the pericardium. Carbon dioxide was insufflated into the pericardium at 5L/min throughout the procedure to minimize intracardiac air.   Mitral Valve Replacement:  The left atrium was opened through a vertical incision in the interatrial groove. Exposure was good. Valve inspection showed markedly thickened leaflets with markedly thickened, fused, shortened and calcified subvalvular apparatus. The valve opening was fixed and very small. The anterior and  posterior leaflets were resected with as much of the subvalvular apparatus as possible to allow valve insertion. This was quite difficult due to calcification. There was some shaggy thrombus on the leaflets on the atrial surface.  A series of everting pledgetted 2-0 Ethibond sutures were placed around the mitral annulus with the pledgets in a supra-annular position. A 25 mm Edwards Magna-ease pericardial valve was chosen. The model number was [id number] and the serial number was [id number]. The sutures were placed through the sewing cuff and it was lowered into place. The sutures were tied. The valve seated well and the leaflets were moving normally. The atrium was closed with 2 layers of continuous 3-0 prolene suture.   Aortic Valve Replacement:   A transverse aortotomy was performed 1 cm above the take-off of the right coronary artery. The native valve was tricuspid with calcified and thickened leaflets and mild annular calcification. The ostia of the coronary arteries were in normal position and were not obstructed. There was some shaggy material that looked like a vegetation on the non-coronary leaflet. This was sent for a stat gram stain and there were no wbc or organisms. It must have been some shaggy thrombus similar to what was seen on the mitral valve. The native valve leaflets were excised and the annulus was decalcified with rongeurs. Care was taken to remove all particulate debris. The left ventricle was directly inspected for debris and then irrigated with ice saline  solution. The annulus was sized and a size 21 mm Edwards Magna-Ease pericardial valve was chosen. The model number was [id number] and the serial number was [id number]. While the valve was being prepared 2-0 Ethibond pledgeted horizontal mattress sutures were placed around the annulus with the pledgets in a sub-annular position. The sutures were placed through the sewing ring and the valve lowered into place. The sutures were tied sequentially. The  valve seated nicely and the coronary ostia were not obstructed. The prosthetic valve leaflets moved normally and there was no sub-valvular obstruction. The aortotomy was closed using 4-0 Prolene suture in 2 layers with felt strips to reinforce the closure.  Completion:  The patient was rewarmed to 37 degrees Centigrade. De-airing maneuvers were performed and the head placed in trendelenburg position. The crossclamp was removed with a time of 203 minutes. There was spontaneous return of sinus rhythm. The aortotomy was checked for hemostasis. Two temporary epicardial pacing wires were placed on the right atrium and two on the right ventricle. The left ventricular vent and retrograde cardioplegia cannulas were removed. The patient was weaned from CPB without difficulty on dopamine at 3 mcg/kg/min. CPB time was 226 minutes. Cardiac output was 4.5 LPM. Heparin was fully reversed with protamine and the aortic and venous cannulas removed. Hemostasis was achieved. Mediastinal drainage tubes were placed. The sternum was closed with double #6 stainless steel wires. The fascia was closed with continuous # 1 vicryl suture. The subcutaneous tissue was closed with 2-0 vicryl continuous suture. The skin was closed with 3-0 vicryl subcuticular suture. All sponge, needle, and instrument counts were reported correct at the end of the case. Dry sterile dressings were placed over the incisions and around the chest tubes which were connected to pleurevac suction. The patient was then transported to the surgical intensive care unit in critical but stable condition.

## 2013-07-19 NOTE — Progress Notes (Signed)
Hypoglycemic Event  CBG: 66  Treatment: 15 GM carbohydrate snack - 4 oz orange juice  Symptoms: None  Follow-up CBG: Time: 0015 Result:67  Treatment: 1 pack graham crackers  Follow-up CBG: Time: 0040 Result 85    [name], Jayme Cloud  Remember to initiate Hypoglycemia Order Set & complete

## 2013-07-19 NOTE — Clinical Documentation Improvement (Signed)
THIS DOCUMENT IS NOT A PERMANENT PART OF THE MEDICAL RECORD  Please update your documentation with the medical record to reflect your response to this query. If you need help knowing how to do this please call [phone].  07/19/13   Dear Dr. Laneta Simmers / Associates,  In a better effort to capture your patient's severity of illness, reflect appropriate length of stay and utilization of resources, a review of the patient medical record has revealed the following indicators.    Based on your clinical judgment, please clarify and document in a progress note and/or discharge summary the clinical condition associated with the following supporting information:  In responding to this query please exercise your independent judgment.  The fact that a query is asked, does not imply that any particular answer is desired or expected.    Possible Clinical Conditions?  "    Pleural Effusion  "    Atelectasis  "    Other Condition  "    Cannot Clinically Determine     Supporting Information:  Diagnostics: CXR:  10/16:  Small left sided pleural effusion, mild basilar atelectasis noted. 10/17:   Basilar opacities, likely atelectasis, improvement in left pleural effusion.    You may use possible, probable, or suspect with inpatient documentation. possible, probable, suspected diagnoses MUST be documented at the time of discharge  Reviewed:  no additional documentation provided  Thank You,  Marciano Sequin,  Clinical Documentation Specialist: [phone] Health Information Management [place]

## 2013-07-19 NOTE — Progress Notes (Signed)
1 Day Post-Op Procedure(s) (LRB): AORTIC VALVE REPLACEMENT (AVR) (N/A) INTRAOPERATIVE TRANSESOPHAGEAL ECHOCARDIOGRAM (N/A) MITRAL VALVE (MV) REPLACEMENT (N/A) Subjective:  Still on vent but awake and ready for extubation.  Objective: Vital signs in last 24 hours: Temp:  [95.2 F (35.1 C)-98.1 F (36.7 C)] 97.9 F (36.6 C) (10/17 0700) Pulse Rate:  [41-98] 79 (10/17 0700) Cardiac Rhythm:  [-] Atrial paced (10/17 0700) Resp:  [11-27] 12 (10/17 0700) BP: (83-122)/(38-70) 94/44 mmHg (10/17 0700) SpO2:  [92 %-100 %] 99 % (10/17 0700) Arterial Line BP: (89-190)/(44-68) 114/68 mmHg (10/17 0700) FiO2 (%):  [40 %-60 %] 40 % (10/17 0700) Weight:  [83.5 kg (184 lb 1.4 oz)] 83.5 kg (184 lb 1.4 oz) (10/17 0530)  Hemodynamic parameters for last 24 hours: PAP: (33-50)/(15-24) 40/16 mmHg CO:  [2.8 L/min-4.4 L/min] 4.4 L/min CI:  [1.6 L/min/m2-2.5 L/min/m2] 2.5 L/min/m2  Intake/Output from previous day: 10/16 0701 - 10/17 0700 In: 7151.6 [I.V.:5039.6; Blood:1062; IV Piggyback:1050] Out: 6260 [Urine:5040; Blood:1050; Chest Tube:170] Intake/Output this shift:    General appearance: calm and alert  Neurologic: intact Heart: regular rate and rhythm, S1, S2 normal, no murmur, click, rub or gallop Lungs: clear to auscultation bilaterally Extremities: edema mild Wound: dressing dry  Lab Results:  Recent Labs  07/18/13 2100 07/19/13 0445  WBC 10.2 12.8*  HGB 11.2* 11.4*  HCT 32.4* 34.0*  PLT 121* 134*   BMET:  Recent Labs  07/17/13 0930  07/18/13 2059 07/18/13 2100 07/19/13 0445  NA 132*  < > 142  --  143  K 4.2  < > 3.5  --  4.1  CL 96  --  106  --  111  CO2 22  --   --   --  24  GLUCOSE 119*  < > 149*  --  116*  BUN 16  --  10  --  10  CREATININE 1.10  --  0.90 0.82 0.86  CALCIUM 9.3  --   --   --  7.4*  < > = values in this interval not displayed.  PT/INR:  Recent Labs  07/18/13 1530  LABPROT 17.8*  INR 1.51*   ABG    Component Value Date/Time   PHART 7.317*  07/19/2013 0427   HCO3 23.9 07/19/2013 0427   TCO2 25 07/19/2013 0427   ACIDBASEDEF 2.0 07/19/2013 0427   O2SAT 97.0 07/19/2013 0427   CBG (last 3)   Recent Labs  07/18/13 2258 07/18/13 2357 07/19/13 0157  GLUCAP 101* 90 133*   Cxr:  Mild edema  ECG:  NSR, no acute change  Assessment/Plan: S/P Procedure(s) (LRB): AORTIC VALVE REPLACEMENT (AVR) (N/A) INTRAOPERATIVE TRANSESOPHAGEAL ECHOCARDIOGRAM (N/A) MITRAL VALVE (MV) REPLACEMENT (N/A) Hemodynamically stable. Wean dopamine off Extubate. Mobilize Diuresis Diabetes control d/c tubes/lines Continue foley due to patient in ICU and urinary output monitoring See progression orders Plan to start coumadin tomorrow. She will need coumadin for 3 months with mitral and aortic pericardial valves. Then she can be switched to ASA or Xarelto depending on what cardiology feels is appropritate. She was on Xarelto before for embolic stroke.   LOS: 1 day    Gene Colee K 07/19/2013

## 2013-07-19 NOTE — Procedures (Signed)
Extubation Procedure Note  Patient Details:   Name: [name] DOB: 08-07-1946 MRN: [id number]   Pt extubated to [facility] per SICU wean protocol, successful SBT.  Pt has good cough and able to vocalize.  Will continue to monitor.  Evaluation  O2 sats: stable throughout Complications: No apparent complications Patient did tolerate procedure well. Bilateral Breath Sounds: Clear Suctioning: Airway Yes  Marie Chow Apple 07/19/2013, 9:56 AM

## 2013-07-20 ENCOUNTER — Inpatient Hospital Stay (HOSPITAL_COMMUNITY): Payer: Medicare Other

## 2013-07-20 LAB — GLUCOSE, CAPILLARY
Glucose-Capillary: 67 mg/dL — ABNORMAL LOW (ref 70–99)
Glucose-Capillary: 85 mg/dL (ref 70–99)
Glucose-Capillary: 80 mg/dL (ref 70–99)
Glucose-Capillary: 89 mg/dL (ref 70–99)
Glucose-Capillary: 130 mg/dL — ABNORMAL HIGH (ref 70–99)
Glucose-Capillary: 99 mg/dL (ref 70–99)

## 2013-07-20 LAB — CBC
HCT: 31.9 % — ABNORMAL LOW (ref 36.0–46.0)
Hemoglobin: 10.5 g/dL — ABNORMAL LOW (ref 12.0–15.0)
MCHC: 32.9 g/dL (ref 30.0–36.0)
MCV: 87.9 fL (ref 78.0–100.0)
RDW: 15.3 % (ref 11.5–15.5)

## 2013-07-20 LAB — BASIC METABOLIC PANEL
BUN: 10 mg/dL (ref 6–23)
CO2: 26 mEq/L (ref 19–32)
Chloride: 104 mEq/L (ref 96–112)
Creatinine, Ser: 0.92 mg/dL (ref 0.50–1.10)
Glucose, Bld: 98 mg/dL (ref 70–99)
Potassium: 3.8 mEq/L (ref 3.5–5.1)

## 2013-07-20 LAB — PROTIME-INR: Prothrombin Time: 20.4 seconds — ABNORMAL HIGH (ref 11.6–15.2)

## 2013-07-20 MED ORDER — FUROSEMIDE 10 MG/ML IJ SOLN
40.0000 mg | Freq: Once | INTRAMUSCULAR | Status: AC
  Administered 2013-07-20: 40 mg via INTRAVENOUS
  Filled 2013-07-20: qty 4

## 2013-07-20 MED ORDER — METOCLOPRAMIDE HCL 5 MG/ML IJ SOLN
10.0000 mg | Freq: Three times a day (TID) | INTRAMUSCULAR | Status: AC
  Administered 2013-07-20 – 2013-07-21 (×3): 10 mg via INTRAVENOUS
  Filled 2013-07-20 (×4): qty 2

## 2013-07-20 MED ORDER — WARFARIN - PHYSICIAN DOSING INPATIENT
Freq: Every day | Status: DC
  Administered 2013-07-31 – 2013-08-01 (×2)

## 2013-07-20 MED ORDER — WARFARIN VIDEO
Freq: Once | Status: AC
  Administered 2013-07-20: 13:00:00

## 2013-07-20 MED ORDER — WARFARIN SODIUM 2.5 MG PO TABS
2.5000 mg | ORAL_TABLET | Freq: Every day | ORAL | Status: DC
  Administered 2013-07-20 – 2013-07-21 (×2): 2.5 mg via ORAL
  Filled 2013-07-20 (×3): qty 1

## 2013-07-20 MED ORDER — COUMADIN BOOK
Freq: Once | Status: AC
  Administered 2013-07-20: 13:00:00
  Filled 2013-07-20: qty 1

## 2013-07-20 MED ORDER — ASPIRIN EC 81 MG PO TBEC
81.0000 mg | DELAYED_RELEASE_TABLET | Freq: Every day | ORAL | Status: DC
  Administered 2013-07-20 – 2013-07-21 (×2): 81 mg via ORAL
  Filled 2013-07-20 (×2): qty 1

## 2013-07-20 MED ORDER — ASPIRIN 81 MG PO CHEW: 324.0000 mg | CHEWABLE_TABLET | Freq: Every day | ORAL | Status: DC

## 2013-07-20 MED ORDER — POTASSIUM CHLORIDE CRYS ER 20 MEQ PO TBCR
40.0000 meq | EXTENDED_RELEASE_TABLET | Freq: Two times a day (BID) | ORAL | Status: AC
  Administered 2013-07-20 (×2): 40 meq via ORAL
  Filled 2013-07-20 (×2): qty 2

## 2013-07-20 NOTE — Progress Notes (Signed)
2 Days Post-Op Procedure(s) (LRB): AORTIC VALVE REPLACEMENT (AVR) (N/A) INTRAOPERATIVE TRANSESOPHAGEAL ECHOCARDIOGRAM (N/A) MITRAL VALVE (MV) REPLACEMENT (N/A) Subjective:  No complaints  Objective: Vital signs in last 24 hours: Temp:  [97.8 F (36.6 C)-98.4 F (36.9 C)] 98.4 F (36.9 C) (10/18 0400) Pulse Rate:  [60-90] 71 (10/18 0700) Cardiac Rhythm:  [-] Normal sinus rhythm;Atrial paced (10/18 0700) Resp:  [8-24] 24 (10/18 0700) BP: (74-153)/(37-73) 122/46 mmHg (10/18 0700) SpO2:  [76 %-100 %] 97 % (10/18 0700) Arterial Line BP: (153-172)/(56-66) 166/65 mmHg (10/17 1100) FiO2 (%):  [40 %] 40 % (10/17 0745) Weight:  [82.5 kg (181 lb 14.1 oz)] 82.5 kg (181 lb 14.1 oz) (10/18 0600)  Hemodynamic parameters for last 24 hours: PAP: (54)/(24) 54/24 mmHg  Intake/Output from previous day: 10/17 0701 - 10/18 0700 In: 750.3 [I.V.:500.3; IV Piggyback:250] Out: 2980 [Urine:2960; Chest Tube:20] Intake/Output this shift:    General appearance: alert and cooperative Neurologic: intact Heart: regular rate and rhythm, S1, S2 normal, no murmur, click, rub or gallop Lungs: diminished breath sounds bibasilar Extremities: edema moderate Wound: incision ok  Lab Results:  Recent Labs  07/19/13 1620 07/19/13 1624 07/20/13 0430  WBC 12.9*  --  12.1*  HGB 10.8* 11.6* 10.5*  HCT 32.7* 34.0* 31.9*  PLT 112*  --  107*   BMET:  Recent Labs  07/19/13 0445  07/19/13 1624 07/20/13 0430  NA 143  --  142 137  K 4.1  --  3.4* 3.8  CL 111  --  104 104  CO2 24  --   --  26  GLUCOSE 116*  --  145* 98  BUN 10  --  8 10  CREATININE 0.86  < > 1.20* 0.92  CALCIUM 7.4*  --   --  7.8*  < > = values in this interval not displayed.  PT/INR:  Recent Labs  07/18/13 1530  LABPROT 17.8*  INR 1.51*   ABG    Component Value Date/Time   PHART 7.317* 07/19/2013 0427   HCO3 23.9 07/19/2013 0427   TCO2 24 07/19/2013 1624   ACIDBASEDEF 2.0 07/19/2013 0427   O2SAT 97.0 07/19/2013 0427   CBG  (last 3)   Recent Labs  07/20/13 0018 07/20/13 0040 07/20/13 0401  GLUCAP 67* 85 80   CLINICAL DATA: Status post cardiac surgery.  EXAM:  PORTABLE CHEST - 1 VIEW  COMPARISON: 07/19/2013  FINDINGS:  Cardiac silhouette is enlarged, but stable. No mediastinal widening.  There is vascular congestion centrally, mild interstitial thickening  bilaterally and hazy bilateral lung base opacity, with the latter  finding consistent with small effusions with associated atelectasis.  There is no gross pneumothorax on this semi-erect study.  All lines and tubes have been removed with the exception of the  right internal jugular sheath.  IMPRESSION:  1. Mild central vascular congestion without overt pulmonary edema.  Lung base opacity consistent with small effusions and atelectasis.  This is stable.  2. No mediastinal widening or gross pneumothorax. No evidence of an  operative complication.  3. Support apparatus has been removed with the exception of the  right internal jugular sheath.  Electronically Signed  By: Amie Portland M.D.  On: 07/20/2013 07:23        Assessment/Plan: S/P Procedure(s) (LRB): AORTIC VALVE REPLACEMENT (AVR) (N/A) INTRAOPERATIVE TRANSESOPHAGEAL ECHOCARDIOGRAM (N/A) MITRAL VALVE (MV) REPLACEMENT (N/A)  She remains hemodynamically stable in sinus rhythm  Start coumadin today for aortic and mitral tissue valves. She will need this for three months before conversion  to xarelto or ASA or both.  Volume excess: 14 lbs over preop. Continue diuresis.  Diabetes: she has never been treated before but Hgb A1C is 6.6 preop and has hx of fasting hyperglycemia. Glucose down to 80 last pm and 60 this am. Will stop Levemir and continue SSI. She should probably be on an oral agent with outpt followup.  Continue IS, ambulation.   LOS: 2 days    [name] K 07/20/2013

## 2013-07-21 LAB — PROTIME-INR
INR: 1.13 (ref 0.00–1.49)
Prothrombin Time: 14.3 seconds (ref 11.6–15.2)

## 2013-07-21 LAB — BASIC METABOLIC PANEL
Calcium: 8 mg/dL — ABNORMAL LOW (ref 8.4–10.5)
Chloride: 98 mEq/L (ref 96–112)
Creatinine, Ser: 0.91 mg/dL (ref 0.50–1.10)
GFR calc Af Amer: 73 mL/min — ABNORMAL LOW (ref >90)
Glucose, Bld: 102 mg/dL — ABNORMAL HIGH (ref 70–99)
Sodium: 134 mEq/L — ABNORMAL LOW (ref 135–145)

## 2013-07-21 LAB — GLUCOSE, CAPILLARY: Glucose-Capillary: 99 mg/dL (ref 70–99)

## 2013-07-21 LAB — TISSUE CULTURE
Culture: NO GROWTH
Gram Stain: NONE SEEN

## 2013-07-21 MED ORDER — TRAMADOL HCL 50 MG PO TABS
50.0000 mg | ORAL_TABLET | ORAL | Status: DC | PRN
  Administered 2013-07-25 (×2): 100 mg via ORAL
  Administered 2013-07-25 – 2013-07-28 (×2): 50 mg via ORAL
  Filled 2013-07-21: qty 1
  Filled 2013-07-21 (×3): qty 2

## 2013-07-21 MED ORDER — ACETAMINOPHEN 325 MG PO TABS
650.0000 mg | ORAL_TABLET | Freq: Four times a day (QID) | ORAL | Status: DC | PRN
  Administered 2013-07-21 – 2013-08-02 (×7): 650 mg via ORAL
  Filled 2013-07-21 (×7): qty 2

## 2013-07-21 MED ORDER — FUROSEMIDE 10 MG/ML IJ SOLN
40.0000 mg | Freq: Two times a day (BID) | INTRAMUSCULAR | Status: DC
  Administered 2013-07-21 – 2013-07-22 (×2): 40 mg via INTRAVENOUS
  Filled 2013-07-21 (×4): qty 4

## 2013-07-21 MED ORDER — DOCUSATE SODIUM 100 MG PO CAPS
200.0000 mg | ORAL_CAPSULE | Freq: Every day | ORAL | Status: DC
  Administered 2013-07-22 – 2013-08-03 (×13): 200 mg via ORAL
  Filled 2013-07-21 (×13): qty 2

## 2013-07-21 MED ORDER — BISACODYL 5 MG PO TBEC: 10.0000 mg | DELAYED_RELEASE_TABLET | Freq: Every day | ORAL | Status: DC | PRN

## 2013-07-21 MED ORDER — ONDANSETRON HCL 4 MG/2ML IJ SOLN
4.0000 mg | Freq: Four times a day (QID) | INTRAMUSCULAR | Status: DC | PRN
  Administered 2013-07-27 – 2013-07-28 (×2): 4 mg via INTRAVENOUS
  Filled 2013-07-21 (×2): qty 2

## 2013-07-21 MED ORDER — FUROSEMIDE 10 MG/ML IJ SOLN
INTRAMUSCULAR | Status: AC
  Administered 2013-07-21: 40 mg
  Filled 2013-07-21: qty 4

## 2013-07-21 MED ORDER — SODIUM CHLORIDE 0.9 % IJ SOLN
3.0000 mL | Freq: Two times a day (BID) | INTRAMUSCULAR | Status: DC
  Administered 2013-07-21 – 2013-08-02 (×20): 3 mL via INTRAVENOUS

## 2013-07-21 MED ORDER — MOVING RIGHT ALONG BOOK
Freq: Once | Status: AC
  Filled 2013-07-21: qty 1

## 2013-07-21 MED ORDER — BISACODYL 10 MG RE SUPP: 10.0000 mg | Freq: Every day | RECTAL | Status: DC | PRN

## 2013-07-21 MED ORDER — OXYCODONE HCL 5 MG PO TABS
5.0000 mg | ORAL_TABLET | ORAL | Status: DC | PRN
  Administered 2013-07-21: 5 mg via ORAL
  Administered 2013-07-22: 10 mg via ORAL
  Administered 2013-07-22 – 2013-07-24 (×6): 5 mg via ORAL
  Administered 2013-07-24 (×2): 10 mg via ORAL
  Administered 2013-07-24: 5 mg via ORAL
  Administered 2013-07-25 – 2013-07-28 (×10): 10 mg via ORAL
  Administered 2013-07-29 – 2013-08-03 (×8): 5 mg via ORAL
  Filled 2013-07-21 (×2): qty 1
  Filled 2013-07-21: qty 2
  Filled 2013-07-21: qty 1
  Filled 2013-07-21 (×5): qty 2
  Filled 2013-07-21: qty 1
  Filled 2013-07-21 (×3): qty 2
  Filled 2013-07-21 (×3): qty 1
  Filled 2013-07-21: qty 2
  Filled 2013-07-21 (×4): qty 1
  Filled 2013-07-21 (×4): qty 2
  Filled 2013-07-21 (×3): qty 1
  Filled 2013-07-21 (×2): qty 2

## 2013-07-21 MED ORDER — ASPIRIN EC 81 MG PO TBEC
81.0000 mg | DELAYED_RELEASE_TABLET | Freq: Every day | ORAL | Status: DC
  Administered 2013-07-22 – 2013-07-28 (×7): 81 mg via ORAL
  Filled 2013-07-21 (×8): qty 1

## 2013-07-21 MED ORDER — METOPROLOL TARTRATE 12.5 MG HALF TABLET
12.5000 mg | ORAL_TABLET | Freq: Two times a day (BID) | ORAL | Status: DC
  Administered 2013-07-21: 12.5 mg via ORAL
  Filled 2013-07-21 (×3): qty 1

## 2013-07-21 MED ORDER — FUROSEMIDE 10 MG/ML IJ SOLN: 40.0000 mg | Freq: Once | INTRAMUSCULAR | Status: DC

## 2013-07-21 MED ORDER — POTASSIUM CHLORIDE CRYS ER 20 MEQ PO TBCR: 40.0000 meq | EXTENDED_RELEASE_TABLET | Freq: Once | ORAL | Status: DC

## 2013-07-21 MED ORDER — POTASSIUM CHLORIDE CRYS ER 20 MEQ PO TBCR
40.0000 meq | EXTENDED_RELEASE_TABLET | Freq: Every day | ORAL | Status: DC
  Administered 2013-07-22 – 2013-07-24 (×3): 40 meq via ORAL
  Filled 2013-07-21 (×5): qty 2

## 2013-07-21 MED ORDER — SODIUM CHLORIDE 0.9 % IV SOLN
250.0000 mL | INTRAVENOUS | Status: DC | PRN
  Administered 2013-07-26: 16:00:00 via INTRAVENOUS

## 2013-07-21 MED ORDER — SODIUM CHLORIDE 0.9 % IJ SOLN
3.0000 mL | INTRAMUSCULAR | Status: DC | PRN
  Administered 2013-07-23: 3 mL via INTRAVENOUS

## 2013-07-21 MED ORDER — ONDANSETRON HCL 4 MG PO TABS: 4.0000 mg | ORAL_TABLET | Freq: Four times a day (QID) | ORAL | Status: DC | PRN

## 2013-07-21 MED ORDER — FAMOTIDINE 20 MG PO TABS
20.0000 mg | ORAL_TABLET | Freq: Two times a day (BID) | ORAL | Status: DC
  Administered 2013-07-21 – 2013-08-03 (×26): 20 mg via ORAL
  Filled 2013-07-21 (×29): qty 1

## 2013-07-21 NOTE — Progress Notes (Signed)
Pt arrived on floor. Pt place on telemetry. Vital signs stable. Will continue to monitor.

## 2013-07-21 NOTE — Progress Notes (Signed)
Report received from Lyn [place]ings[place], [place]. Awaiting pt arrival on floor.

## 2013-07-21 NOTE — Progress Notes (Signed)
3 Days Post-Op Procedure(s) (LRB): AORTIC VALVE REPLACEMENT (AVR) (N/A) INTRAOPERATIVE TRANSESOPHAGEAL ECHOCARDIOGRAM (N/A) MITRAL VALVE (MV) REPLACEMENT (N/A) Subjective:  No complaints. Ambulated twice today  Objective: Vital signs in last 24 hours: Temp:  [97.5 F (36.4 C)-98.5 F (36.9 C)] 97.9 F (36.6 C) (10/19 0713) Pulse Rate:  [67-92] 67 (10/19 1000) Cardiac Rhythm:  [-] Normal sinus rhythm (10/19 0800) Resp:  [10-26] 13 (10/19 1000) BP: (110-151)/(53-102) 124/61 mmHg (10/19 1000) SpO2:  [91 %-100 %] 100 % (10/19 1000) Weight:  [81.8 kg (180 lb 5.4 oz)] 81.8 kg (180 lb 5.4 oz) (10/19 0500)  Hemodynamic parameters for last 24 hours:    Intake/Output from previous day: 10/18 0701 - 10/19 0700 In: 1710 [P.O.:1250; I.V.:460] Out: 1660 [Urine:1660] Intake/Output this shift: Total I/O In: 560 [P.O.:500; I.V.:60] Out: 120 [Urine:120]  General appearance: alert and cooperative Neurologic: intact Heart: regular rate and rhythm, S1, S2 normal, no murmur, click, rub or gallop Lungs: clear to auscultation bilaterally Extremities: edema moderate Wound: incision ok  Lab Results:  Recent Labs  07/19/13 1620 07/19/13 1624 07/20/13 0430  WBC 12.9*  --  12.1*  HGB 10.8* 11.6* 10.5*  HCT 32.7* 34.0* 31.9*  PLT 112*  --  107*   BMET:  Recent Labs  07/20/13 0430 07/21/13 0420  NA 137 134*  K 3.8 4.2  CL 104 98  CO2 26 28  GLUCOSE 98 102*  BUN 10 13  CREATININE 0.92 0.91  CALCIUM 7.8* 8.0*    PT/INR:  Recent Labs  07/21/13 0420  LABPROT 14.3  INR 1.13   ABG    Component Value Date/Time   PHART 7.317* 07/19/2013 0427   HCO3 23.9 07/19/2013 0427   TCO2 24 07/19/2013 1624   ACIDBASEDEF 2.0 07/19/2013 0427   O2SAT 97.0 07/19/2013 0427   CBG (last 3)   Recent Labs  07/20/13 2326 07/21/13 0332 07/21/13 0708  GLUCAP 99 81 101*    Assessment/Plan: S/P Procedure(s) (LRB): AORTIC VALVE REPLACEMENT (AVR) (N/A) INTRAOPERATIVE TRANSESOPHAGEAL  ECHOCARDIOGRAM (N/A) MITRAL VALVE (MV) REPLACEMENT (N/A) Hemodynamically stable Mobilize Continue coumadin for AVR/MVR with pericardial valve. Plan for transfer to step-down: see transfer orders   LOS: 3 days    [name] K 07/21/2013

## 2013-07-22 ENCOUNTER — Encounter (HOSPITAL_COMMUNITY): Payer: Self-pay | Admitting: Emergency Medicine

## 2013-07-22 LAB — TYPE AND SCREEN
ABO/RH(D): A POS
Antibody Screen: NEGATIVE
Unit division: 0
Unit division: 0
Unit division: 0
Unit division: 0

## 2013-07-22 LAB — PROTIME-INR
INR: 1.19 (ref 0.00–1.49)
Prothrombin Time: 14.8 seconds (ref 11.6–15.2)

## 2013-07-22 MED ORDER — LEVALBUTEROL HCL 0.63 MG/3ML IN NEBU
0.6300 mg | INHALATION_SOLUTION | Freq: Four times a day (QID) | RESPIRATORY_TRACT | Status: DC | PRN
  Administered 2013-07-25 – 2013-07-29 (×6): 0.63 mg via RESPIRATORY_TRACT
  Filled 2013-07-22 (×6): qty 3

## 2013-07-22 MED ORDER — AMIODARONE HCL 200 MG PO TABS
400.0000 mg | ORAL_TABLET | Freq: Two times a day (BID) | ORAL | Status: DC
  Administered 2013-07-22 – 2013-07-23 (×3): 400 mg via ORAL
  Filled 2013-07-22 (×5): qty 2

## 2013-07-22 MED ORDER — WARFARIN SODIUM 5 MG PO TABS
5.0000 mg | ORAL_TABLET | Freq: Every day | ORAL | Status: DC
  Administered 2013-07-22: 5 mg via ORAL
  Filled 2013-07-22 (×2): qty 1

## 2013-07-22 MED ORDER — DILTIAZEM LOAD VIA INFUSION
10.0000 mg | Freq: Once | INTRAVENOUS | Status: AC
  Administered 2013-07-22: 10 mg via INTRAVENOUS
  Filled 2013-07-22: qty 10

## 2013-07-22 MED ORDER — METOPROLOL TARTRATE 25 MG PO TABS
25.0000 mg | ORAL_TABLET | Freq: Once | ORAL | Status: AC
  Administered 2013-07-22: 25 mg via ORAL

## 2013-07-22 MED ORDER — METOPROLOL TARTRATE 25 MG PO TABS
25.0000 mg | ORAL_TABLET | Freq: Two times a day (BID) | ORAL | Status: DC
  Administered 2013-07-22 – 2013-07-23 (×4): 25 mg via ORAL
  Filled 2013-07-22 (×6): qty 1

## 2013-07-22 MED ORDER — DILTIAZEM HCL 100 MG IV SOLR
10.0000 mg/h | INTRAVENOUS | Status: DC
  Administered 2013-07-22 – 2013-07-23 (×3): 10 mg/h via INTRAVENOUS
  Filled 2013-07-22 (×3): qty 100

## 2013-07-22 MED ORDER — METOPROLOL TARTRATE 1 MG/ML IV SOLN
2.5000 mg | Freq: Once | INTRAVENOUS | Status: AC
  Administered 2013-07-22: 2.5 mg via INTRAVENOUS
  Filled 2013-07-22: qty 5

## 2013-07-22 MED ORDER — FUROSEMIDE 10 MG/ML IJ SOLN
40.0000 mg | Freq: Every day | INTRAMUSCULAR | Status: DC
  Filled 2013-07-22: qty 4

## 2013-07-22 NOTE — Progress Notes (Signed)
CARDIAC REHAB PHASE I   PRE:  Rate/Rhythm: 120  BP:  Supine:   Sitting: 104/72  Standing:    SaO2: 98 2L 91 RA  MODE:  Ambulation: 350 ft   POST:  Rate/Rhythm: 138  BP:  Supine:   Sitting: 110/76  Standing:    SaO2: 89-90 RA when back in bed 85 RA 1320-1415 On arrival pt in bed sat onO2 2L 98%, O2 discontinued sat 91%  RA. Assisted X 1 and used walker to ambulate. Gait steady with walker. Pt c/o of weakness and legs feeling shaky. She ask to go back to room, but encouraged her to try to go further. She was able to walk 350 feet, HR after walk 138. RA sat when back to room 89%, placed pt in bed per her request. When pt got back in bed RA sat dropped to 85%,O2 replaced 2L sat back to 94%. I encouraged use of IS and more walks today. Melina Copa RN 07/22/2013 2:17 PM

## 2013-07-22 NOTE — Progress Notes (Signed)
On cardizem 10 mg /hr. She is now in A-fib with rate 80's. SBP 120. She is comfortable. Will start amiodarone po to try to convert and if she converts stop cardizem.

## 2013-07-22 NOTE — Progress Notes (Signed)
Pt ambulated with assistx2 on 2L [place] and walker. Pt tolerated well, no SOB or dizziness. Pt ambulated approx. 300 feet. HR 80-90's during ambulation. Pt resting in chair with call bell in reach.

## 2013-07-22 NOTE — Anesthesia Postprocedure Evaluation (Signed)
  Anesthesia Post-op Note  Patient: [name]  Procedure(s) Performed: Procedure(s): AORTIC VALVE REPLACEMENT (AVR) (N/A) INTRAOPERATIVE TRANSESOPHAGEAL ECHOCARDIOGRAM (N/A) MITRAL VALVE (MV) REPLACEMENT (N/A)  Patient Location: PACU and SICU  Anesthesia Type:General  Level of Consciousness: sedated  Airway and Oxygen Therapy: Patient remains intubated per anesthesia plan  Post-op Pain: mild  Post-op Assessment: Post-op Vital signs reviewed  Post-op Vital Signs: Reviewed  Complications: No apparent anesthesia complications

## 2013-07-22 NOTE — Care Management Note (Unsigned)
    Page 1 of 2   08/02/2013     4:32:59 PM   CARE MANAGEMENT NOTE 08/02/2013  Patient:  [name], [name]   Account Number:  [id number]  Date Initiated:  07/19/2013  Documentation initiated by:  [facility]  Subjective/Objective Assessment:   Post op AVR     Action/Plan:   Anticipated DC Date:  08/01/2013   Anticipated DC Plan:  HOME W HOME HEALTH SERVICES      DC Planning Services  CM consult      [facility] Choice  HOME HEALTH   Choice offered to / List presented to:  C-1 Patient        HH arranged  HH-1 RN  HH-2 PT      [facility] agency  [facility]   Status of service:  In process, will continue to follow Medicare Important Message given?   (If response is "NO", the following Medicare IM given date fields will be blank) Date Medicare IM given:   Date Additional Medicare IM given:    Discharge Disposition:    Per UR Regulation:  Reviewed for med. necessity/level of care/duration of stay  If discussed at Long Length of Stay Meetings, dates discussed:   07/23/2013  07/25/2013  07/30/2013    Comments:  ContactIndya, Oliveria [phone]                 Bayes,Jennifer Daughter [phone] 08/02/13 Jacqulene Huntley,RN,BSN [phone] PT STATES FEELING BETTER, THOUGH TIRED.  MET WITH PT AND HUSBAND TO DISCUSS HH ARRANGEMENTS.  THEY ARE AGREEABLE TO THIS; REFERRAL TO [facility] FOR HH FOLLOW UP, PER CHOICE.  START OF CARE 24-48H POST DC DATE.  POSS DC TOMORROW IF CXR STABLE.  07/30/13 Kaydenn Mclear,RN,BSN [phone] RATE CONTROLLED; THORACENTESIS AT BEDSIDE THIS AM.  REMAINS ON 1LITER OXYGEN.  MAY NEED HOME O2 IF UNABLE TO WEAN. WILL FOLLOW PROGRESS.  07/23/13 Anahi Belmar,RN,BSN [phone] PT REQUESTS RW FOR HOME.  REFERRAL TO [facility] FOR DME NEEDS.  07/22/13 Hance Caspers,RN,BSN [phone] S/P AVR/MVR ON 10/16.  HUSBAND AND DAUGHTER TO PROVIDE 24HR CARE AT DC. WILL CONT TO FOLLOW FOR DC NEEDS.

## 2013-07-22 NOTE — Progress Notes (Addendum)
[name]       [place] [address]             [phone]      4 Days Post-Op  Procedure(s) (LRB): AORTIC VALVE REPLACEMENT (AVR) (N/A) INTRAOPERATIVE TRANSESOPHAGEAL ECHOCARDIOGRAM (N/A) MITRAL VALVE (MV) REPLACEMENT (N/A) Subjective:  had some SOB /wheezing overnight  Objective  Telemetry sinus tachy 140-150-s  Temp:  [97.6 F (36.4 C)-99.8 F (37.7 C)] 98.9 F (37.2 C) (10/20 0440) Pulse Rate:  [67-128] 128 (10/20 0440) Resp:  [11-19] 19 (10/20 0440) BP: (117-149)/(53-102) 122/78 mmHg (10/20 0440) SpO2:  [95 %-100 %] 98 % (10/20 0440) Weight:  [174 lb 14.4 oz (79.334 kg)] 174 lb 14.4 oz (79.334 kg) (10/20 0440)   Intake/Output Summary (Last 24 hours) at 07/22/13 0746 Last data filed at 07/22/13 0446  Gross per 24 hour  Intake    810 ml  Output   3910 ml  Net  -3100 ml       General appearance: alert, cooperative and no distress Heart: regular rate and rhythm and tachy Lungs: dim in bases, no current wheeze Abdomen: soft, non-tender Extremities: mild edema Wound: incisions healing well  Lab Results:  Recent Labs  07/19/13 1620  07/20/13 0430 07/21/13 0420  NA  --   < > 137 134*  K  --   < > 3.8 4.2  CL  --   < > 104 98  CO2  --   --  26 28  GLUCOSE  --   < > 98 102*  BUN  --   < > 10 13  CREATININE 0.91  < > 0.92 0.91  CALCIUM  --   --  7.8* 8.0*  MG 2.5  --   --   --   < > = values in this interval not displayed. No results found for this basename: AST, ALT, ALKPHOS, BILITOT, PROT, ALBUMIN,  in the last 72 hours No results found for this basename: LIPASE, AMYLASE,  in the last 72 hours  Recent Labs  07/19/13 1620 07/19/13 1624 07/20/13 0430  WBC 12.9*  --  12.1*  HGB 10.8* 11.6* 10.5*  HCT 32.7* 34.0* 31.9*  MCV 87.2  --  87.9  PLT 112*  --  107*   No results found for this basename: CKTOTAL, CKMB, TROPONINI,  in the last 72 hours No components found with this basename: POCBNP,  No results found for this  basename: DDIMER,  in the last 72 hours No results found for this basename: HGBA1C,  in the last 72 hours No results found for this basename: CHOL, HDL, LDLCALC, TRIG, CHOLHDL,  in the last 72 hours No results found for this basename: TSH, T4TOTAL, FREET3, T3FREE, THYROIDAB,  in the last 72 hours No results found for this basename: VITAMINB12, FOLATE, FERRITIN, TIBC, IRON, RETICCTPCT,  in the last 72 hours  Medications: Scheduled . aspirin EC  81 mg Oral Daily  . atorvastatin  20 mg Oral Daily  . buPROPion  150 mg Oral Daily  . docusate sodium  200 mg Oral Daily  . enoxaparin (LOVENOX) injection  40 mg Subcutaneous QHS  . famotidine  20 mg Oral BID  . fluticasone  2 puff Inhalation BID  . furosemide  40 mg Intravenous BID  . levothyroxine  112 mcg Oral QAC breakfast  . metoprolol tartrate  12.5 mg Oral BID  . multivitamin with minerals  1 tablet Oral Daily  . mupirocin ointment  Topical BID  . potassium chloride  40 mEq Oral Daily  . saccharomyces boulardii  250 mg Oral Daily  . sodium chloride  3 mL Intravenous Q12H  . warfarin  2.5 mg Oral q1800  . Warfarin - Physician Dosing Inpatient   Does not apply q1800     Radiology/Studies:  No results found.  INR:1.19 Will add last result for INR, ABG once components are confirmed Will add last 4 CBG results once components are confirmed  Assessment/Plan: S/P Procedure(s) (LRB): AORTIC VALVE REPLACEMENT (AVR) (N/A) INTRAOPERATIVE TRANSESOPHAGEAL ECHOCARDIOGRAM (N/A) MITRAL VALVE (MV) REPLACEMENT (N/A)  1 increase beta blocker 2 add xopenex prn for wheeze 3 cont rehab/pulm toilet 4 cont acrx    LOS: 4 days    [name],[name] 10/20/20147:46 AM   Chart reviewed, patient examined, agree with above. I don't think her rhythm is sinus tach at 140-150. It is more likely to be atrial flutter. I tried to rapid A-pace but could not capture. Will try cardizem to slow her down and try to convert.

## 2013-07-22 NOTE — Progress Notes (Signed)
Patient's HR sustaining in 130's - 140's. Patient is currently Sinus Tach. Patient Asymptomatic. PA notified and orders given. Orders followed through with. Will continue to monitor patient and HR. Stanton Kidney R

## 2013-07-23 LAB — BASIC METABOLIC PANEL
BUN: 10 mg/dL (ref 6–23)
Chloride: 94 mEq/L — ABNORMAL LOW (ref 96–112)
Creatinine, Ser: 0.76 mg/dL (ref 0.50–1.10)
GFR calc Af Amer: >90 mL/min (ref >90)
GFR calc non Af Amer: 84 mL/min — ABNORMAL LOW (ref >90)
Glucose, Bld: 136 mg/dL — ABNORMAL HIGH (ref 70–99)
Potassium: 3.7 mEq/L (ref 3.5–5.1)

## 2013-07-23 LAB — PROTIME-INR
INR: 1.31 (ref 0.00–1.49)
Prothrombin Time: 16 seconds — ABNORMAL HIGH (ref 11.6–15.2)

## 2013-07-23 MED ORDER — METOPROLOL TARTRATE 1 MG/ML IV SOLN
5.0000 mg | Freq: Once | INTRAVENOUS | Status: AC
  Administered 2013-07-23: 5 mg via INTRAVENOUS

## 2013-07-23 MED ORDER — POTASSIUM CHLORIDE CRYS ER 20 MEQ PO TBCR
30.0000 meq | EXTENDED_RELEASE_TABLET | Freq: Once | ORAL | Status: AC
  Administered 2013-07-23: 30 meq via ORAL
  Filled 2013-07-23: qty 1

## 2013-07-23 MED ORDER — WARFARIN SODIUM 4 MG PO TABS
4.0000 mg | ORAL_TABLET | Freq: Every day | ORAL | Status: DC
  Administered 2013-07-23: 4 mg via ORAL
  Filled 2013-07-23 (×2): qty 1

## 2013-07-23 MED ORDER — FUROSEMIDE 10 MG/ML IJ SOLN
40.0000 mg | Freq: Once | INTRAMUSCULAR | Status: AC
  Administered 2013-07-23: 40 mg via INTRAVENOUS
  Filled 2013-07-23: qty 4

## 2013-07-23 MED ORDER — FUROSEMIDE 40 MG PO TABS
40.0000 mg | ORAL_TABLET | Freq: Every day | ORAL | Status: DC
  Filled 2013-07-23: qty 1

## 2013-07-23 MED ORDER — METOPROLOL TARTRATE 1 MG/ML IV SOLN
INTRAVENOUS | Status: AC
  Filled 2013-07-23: qty 5

## 2013-07-23 MED ORDER — WARFARIN SODIUM 2.5 MG PO TABS
2.5000 mg | ORAL_TABLET | Freq: Every day | ORAL | Status: DC
  Filled 2013-07-23: qty 1

## 2013-07-23 NOTE — Progress Notes (Signed)
Pt back in AFib rate controlled (HR 80's). Notified PA, no further orders as long as rate is controlled. Will continue to monitor.

## 2013-07-23 NOTE — Progress Notes (Signed)
Pt ambulated approx. 350 feet with rolling walker and assistance. Pt denied SOB or dizziness. Pt HR 70-80's and sats 93-96% on room air while ambulating. Pt's sats at rest 91% on room air. Pt eager to keep working with IS and keep O2 nasal cannula off.

## 2013-07-23 NOTE — Progress Notes (Signed)
Stopped Cardizem drip per order. Will continue to monitor rhythm.

## 2013-07-23 NOTE — Progress Notes (Addendum)
It appears pt has converted to SR with some PAC's. Notified PA who is on the floor. BP 127/71, HR 60. PA ordered to keep drip going 10 more minutes then shut off.

## 2013-07-23 NOTE — Progress Notes (Signed)
Pt's HR now low 100's- 110's. Appears to be SR, but occasionally  Back in Afib. Notified PA and ordered 5mg  IV lopressor one time. Will continue to monitor.

## 2013-07-23 NOTE — Progress Notes (Addendum)
[address]       [place] [address]             [phone]        5 Days Post-Op Procedure(s) (LRB): AORTIC VALVE REPLACEMENT (AVR) (N/A) INTRAOPERATIVE TRANSESOPHAGEAL ECHOCARDIOGRAM (N/A) MITRAL VALVE (MV) REPLACEMENT (N/A)  Subjective: Patient has a productive cough. No other complaints.  Objective: Vital signs in last 24 hours: Temp:  [98 F (36.7 C)-99.2 F (37.3 C)] 98 F (36.7 C) (10/21 0506) Pulse Rate:  [73-147] 83 (10/21 0506) Cardiac Rhythm:  [-] Atrial fibrillation (10/20 1945) Resp:  [18-20] 18 (10/21 0506) BP: (106-129)/(71-83) 112/76 mmHg (10/21 0506) SpO2:  [92 %-99 %] 92 % (10/21 0506) Weight:  [75.4 kg (166 lb 3.6 oz)] 75.4 kg (166 lb 3.6 oz) (10/21 0232)  Pre op weight 76 kg Current Weight  07/23/13 75.4 kg (166 lb 3.6 oz)      Intake/Output from previous day: 10/20 0701 - 10/21 0700 In: 840 [P.O.:720; I.V.:120] Out: 2601 [Urine:2600; Stool:1]   Physical Exam:  Cardiovascular: Tachycardic Pulmonary: Diminished at bases; no rales, wheezes, or rhonchi. Abdomen: Soft, non tender, bowel sounds present. Extremities: Trace bilateral lower extremity edema. Wounds: Clean and dry.  No erythema or signs of infection.  Lab Results: CBC:No results found for this basename: WBC, HGB, HCT, PLT,  in the last 72 hours BMET:  Recent Labs  07/21/13 0420 07/23/13 0535  NA 134* 133*  K 4.2 3.7  CL 98 94*  CO2 28 30  GLUCOSE 102* 136*  BUN 13 10  CREATININE 0.91 0.76  CALCIUM 8.0* 8.6    PT/INR:  Lab Results  Component Value Date   INR 1.31 07/23/2013   INR 1.19 07/22/2013   INR 1.13 07/21/2013   ABG:  INR: Will add last result for INR, ABG once components are confirmed Will add last 4 CBG results once components are confirmed  Assessment/Plan:  1. CV - A fl/fib with HR into the 100's. She did convert to SR early afternoon yesterday but went back intofib with CVR. ST with HR 110's this am.On Lopressor 25 bid,  Amiodarone 400 bid, and Coumadin. INR increased from 1.31 to 1.98. Will give Coumadin 2 tonight. Stop Lovenox. After discussion with Dr. Laneta Simmers, will stop Lopressor and start Cardizem 30 qid. Will also restart low dose Losartan as she had taken this pre op and SBP mostly in the 140's. 2.  Pulmonary - Weaned to room air. Wean as tolerates. Encourage incentive spirometer. 3. Volume Overload - On Lasix 40 IV with good diuresis. Will give po as is now below pre op weight. 4.  Acute blood loss anemia - Last H and H 10.5 and 31.9 5.Disconnect pacer. Remove EPW as INR now up to 1.98. 6.DM-CBGs 101/127/117. Pre op HGA1C 6.6. Patient has been told previously she is pre diabetic. She will be started on Metformin 500 bid. She will need follow up with medical doctor as outpatient. 7.Continue CRPI  ZIMMERMAN,DONIELLE MPA-C 07/23/2013,7:26 AM    Chart reviewed, patient examined, agree with above. She is in rate-controlled A-fib today on cardizem po and amio. Her INR is almost therapeutic. I suspect that as she gets further out she will return to sinus. She may be able to go home tomorrow if rate stays controlled. I would keep her on daily lasix. I am hesitant to restart losartan now while she is having atrial arrhythmias on cardizem, lasix. This can be restarted as an outpt if BP  remains elevated.

## 2013-07-23 NOTE — Progress Notes (Signed)
CARDIAC REHAB PHASE I   PRE:  Rate/Rhythm: 66 Afib  BP:  Supine:   Sitting: 107/66  Standing:    SaO2: 90 RA  MODE:  Ambulation: 550 ft   POST:  Rate/Rhythm: 72  BP:  Supine:   Sitting: 120/70  Standing:    SaO2: 88-90 RA 1405-1450 Assisted X1 and used walker to ambulate. Gait steady with walker. VS stable . Pt tired by end of walk to bed with call light in reach.  Melina Copa RN 07/23/2013 2:47 PM

## 2013-07-23 NOTE — Progress Notes (Signed)
Received referral from Long Length of Stay meeting for [facility] Care Management services. Met with Mrs Hooser and daughter at bedside to explain and discuss [facility] Care Management services. Mrs Dinse intends on discharging home with husband upon discharge. She reports she follow closely with her PCP as well. Explained that she will receive follow up discharge call and will be evaluated for monthly home visits. Consents were signed and [facility] Care Management packet left at bedside.  Raiford Noble, MSN-Ed, RN,BSN- [facility] [phone]

## 2013-07-24 LAB — TSH: TSH: 6.931 u[IU]/mL — ABNORMAL HIGH (ref 0.350–4.500)

## 2013-07-24 LAB — GLUCOSE, CAPILLARY
Glucose-Capillary: 109 mg/dL — ABNORMAL HIGH (ref 70–99)
Glucose-Capillary: 145 mg/dL — ABNORMAL HIGH (ref 70–99)

## 2013-07-24 LAB — PROTIME-INR: INR: 1.98 — ABNORMAL HIGH (ref 0.00–1.49)

## 2013-07-24 MED ORDER — LOSARTAN POTASSIUM 25 MG PO TABS
25.0000 mg | ORAL_TABLET | Freq: Every day | ORAL | Status: DC
  Administered 2013-07-24: 25 mg via ORAL
  Filled 2013-07-24: qty 1

## 2013-07-24 MED ORDER — AMIODARONE HCL 200 MG PO TABS
400.0000 mg | ORAL_TABLET | Freq: Two times a day (BID) | ORAL | Status: DC
  Administered 2013-07-24 – 2013-07-30 (×14): 400 mg via ORAL
  Filled 2013-07-24 (×17): qty 2

## 2013-07-24 MED ORDER — AMIODARONE HCL 200 MG PO TABS
400.0000 mg | ORAL_TABLET | Freq: Three times a day (TID) | ORAL | Status: DC
  Filled 2013-07-24 (×3): qty 2

## 2013-07-24 MED ORDER — FUROSEMIDE 40 MG PO TABS
40.0000 mg | ORAL_TABLET | Freq: Every day | ORAL | Status: DC
  Administered 2013-07-24: 40 mg via ORAL
  Filled 2013-07-24 (×2): qty 1

## 2013-07-24 MED ORDER — DILTIAZEM HCL 30 MG PO TABS
30.0000 mg | ORAL_TABLET | Freq: Four times a day (QID) | ORAL | Status: DC
  Administered 2013-07-24 – 2013-07-25 (×4): 30 mg via ORAL
  Filled 2013-07-24 (×8): qty 1

## 2013-07-24 MED ORDER — WARFARIN SODIUM 2 MG PO TABS
2.0000 mg | ORAL_TABLET | Freq: Every day | ORAL | Status: DC
  Administered 2013-07-24: 2 mg via ORAL
  Filled 2013-07-24 (×2): qty 1

## 2013-07-24 MED ORDER — INSULIN ASPART 100 UNIT/ML ~~LOC~~ SOLN
0.0000 [IU] | Freq: Three times a day (TID) | SUBCUTANEOUS | Status: DC
  Administered 2013-07-24 – 2013-07-25 (×2): 2 [IU] via SUBCUTANEOUS
  Administered 2013-07-25: 3 [IU] via SUBCUTANEOUS
  Administered 2013-07-27: 2 [IU] via SUBCUTANEOUS
  Administered 2013-07-27 – 2013-07-28 (×2): 3 [IU] via SUBCUTANEOUS
  Administered 2013-07-29 – 2013-08-02 (×8): 2 [IU] via SUBCUTANEOUS

## 2013-07-24 MED ORDER — METFORMIN HCL 500 MG PO TABS
500.0000 mg | ORAL_TABLET | Freq: Two times a day (BID) | ORAL | Status: DC
  Administered 2013-07-24 – 2013-08-03 (×18): 500 mg via ORAL
  Filled 2013-07-24 (×22): qty 1

## 2013-07-24 MED ORDER — LOSARTAN POTASSIUM 50 MG PO TABS: 50.0000 mg | ORAL_TABLET | Freq: Every day | ORAL | Status: DC

## 2013-07-24 NOTE — Progress Notes (Signed)
CARDIAC REHAB PHASE I   PRE:  Rate/Rhythm: 99 Afib  BP:  Supine:   Sitting: 128/72  Standing:    SaO2: 100 RA  MODE:  Ambulation: 700 ft   POST:  Rate/Rhythm: 104 ST  BP:  Supine:   Sitting: 134/70  Standing:    SaO2: 94 RA 1015-1045 Assisted X 1 and used walker to ambulate. Gait steady with walker. VS stable. Pt increased distance to 700 feet today. Pt back to recliner after walk with call light in reach.  Melina Copa RN 07/24/2013 10:43 AM

## 2013-07-24 NOTE — Discharge Summary (Signed)
Physician Discharge Summary       [address] [place].[address]       [place] [address]             [phone]    Patient ID: [name] MRN: [id number] DOB/AGE: 1946/05/30 67 y.o.  Admit date: 07/18/2013 Discharge date: 08/03/2013    Admission Diagnoses: 1. Severe mitral stenosis 2. Moderate to severe aortic stenosis 3. Moderate aortic insufficiency 4. History of hypertension 5. History of hyperlipidemia 6. History of rheumatic heart disease 7. History of cardio embolic stroke 8. History of CKD (stage 2) 9.History of breast cancer  Discharge Diagnoses:  1. Severe mitral stenosis 2. Moderate to severe aortic stenosis 3. Moderate aortic insufficiency 4. History of hypertension 5. History of hyperlipidemia 6. History of rheumatic heart disease 7. History of cardio embolic stroke 8. History of CKD (stage 2) 9.History of breast cancer 10. ABL anemia 11. A fib/flutter 12. Right pleural effusion  Procedure (s):  1. Median Sternotomy 2. Extracorporeal circulation 3. Aortic valve replacement using a 21mm Edwards Magna-ease pericardial valve.  4. Mitral valve replacement using a 25 mm Edwards Magna-ease pericardial valve by Dr. Laneta Simmers on 07/18/2013.  5. Right thoracentesis by Dr. Laneta Simmers on 07/30/2013.   History of Presenting Illness: She has a history of presumed rheumatic valve disease, status post digital mitral commisurotomy via left thoracotomy incision, at age 67. She reports a couple year history of shortness of breath with significant exertion such as going up stairs. She presented with a three-week history of worsening shortness of breath with exertion and lower extremity edema. She said that she has recently been getting markedly short of breath walking on level ground. She's also had some chest discomfort associated with the shortness of breath. She was seen in the emergency room and found to be in pulmonary edema. A CT scan of the chest was negative for  pulmonary embolism but did show some pulmonary edema. She improved with diuresis. She underwent cardiac catheterization and TEE. Cardiac catheterization results showed no evidence or coronary artery disease. TEE showed LVEF to be 35-40%, thickened mitral valve,moderate to severe MS, thickened aortic valve,moderate AS, mild to moderate AI. Dr. Laneta Simmers saw the patient in consultation during this admission. It was discussed with the patient the need for mitral valve replacement and aortic valve replacement. Potential risks, benefits, and complications were discussed with the patient and she agreed to proceed with surgery. Pre operative carotid duplex US showed bilateral 1-39% stenosis of the internal carotid arteries.She wished to be discharged and return to have the surgery on 07/18/2013.    Brief Hospital Course:  The patient was extubated the morning of post operative day one without difficulty. She remained afebrile and hemodynamically stable. Theone Murdoch, a line, chest tubes, and foley were removed early in the post operative course. Lopressor was started and titrated accordingly. She was volume over loaded and diuresed. She was weaned off the insulin drip. The patient's HGA1C pre op was 6.6. She was started on Metformin with good control of her glucose. She will require follow up with her medical doctor after discharge. She was started on Coumadin and her PT and INR were monitored daily.The patient was felt surgically stable for transfer from the ICU to [facility]U for further convalescence on 07/21/2013. She went into a fib with rvr. She was placed on a Cardizem drip as well as Amiodarone orally. She did briefly convert to sinus rhythm, but then went back into a fib with HR into the  110's. As a result, her Lopressor was stopped (did not help with HR), Amiodarone 400 bid was continued, and she was started on oral Cardizem. Her afib/flutter rate was still not well conrolled so her Cardizem was increased.  The patient  continued to convert in and out of Atrial Fibrillation.  However, her rate remained in the low 100s.  She was restarted on her home dose of Lopressor which was successful in lowering her heart rate.  The patient later had an near syncopal episode per her daughter.  However, patient's vitals remained stable and she was alert and oriented.  This was most likely a result of a vagal episode.  She also suffered an episode of dyspnea with labored breathing.  The patient was give a nebulizer treatment, which provided some minor relief. Ultimately she was ordered a dose of oral Lasix which the patient felt relieved her symptoms.  The patient is volume overloaded and most recent CXR showed an increase in the right sided pleural effusion.  She was aggressively diuresed with IV Lasix.  Diuresis then had to be briefly stopped as she became hyponatremic (sodium down to 121).  She did require a right thoracentesis on 10/28. Approximately 1500 cc of serosanguinous fluid was removed. She currently is on room air.  She is currently on Coumadin 2.5 mg daily.  Her pacing wires have been removed without difficulty.  She continues to progress with cardiac rehab.  She has been tolerating a diet and has had a bowel movement. Her chest x-ray shows improvement in the right effusion with diuresis.  She has been evaluated on today's date and is ready for discharge home.   Latest Vital Signs: Blood pressure 136/67, pulse 71, temperature 98.5 F (36.9 C), temperature source Oral, resp. rate 20, height 5\' 2"  (1.575 m), weight 77.792 kg (171 lb 8 oz), SpO2 97.00%.  Physical Exam: Cardiovascular: regular rate and rhythm Pulmonary: Diminished at bases; no rales, wheezes, or rhonchi.  Abdomen: Soft, non tender, bowel sounds present.  Extremities: Trace bilateral lower extremity edema.  Wounds: Clean and dry. No erythema or signs of infection.   Discharge Condition:Stable  Recent laboratory studies:  Lab Results  Component Value  Date   WBC 16.3* 07/31/2013   HGB 9.8* 07/31/2013   HCT 29.7* 07/31/2013   MCV 86.6 07/31/2013   PLT 355 07/31/2013   Lab Results  Component Value Date   CREATININE 0.79 07/31/2013   BUN 10 07/31/2013   NA 127* 07/31/2013   K 3.6 07/31/2013   CL 87* 07/31/2013   CO2 32 07/31/2013   Lab Results  Component Value Date   INR 2.71* 08/03/2013   INR 2.49* 08/02/2013   INR 2.00* 08/01/2013       Diagnostic Studies:   Ct Angio Chest Pe W/cm &/or Wo Cm  07/07/2013   *RADIOLOGY REPORT*  Clinical Data: Shortness of breath and chest pain; back pain.  CT ANGIOGRAPHY CHEST  Technique:  Multidetector CT imaging of the chest using the standard protocol during bolus administration of intravenous contrast. Multiplanar reconstructed images including MIPs were obtained and reviewed to evaluate the vascular anatomy.  Contrast: OMNIPAQUE IOHEXOL 350 MG/ML SOLN  Comparison: Chest radiograph performed in the today at 12:22 a.m., and CT of the chest performed 02/19/2009  Findings: There is no evidence of pulmonary embolus.  A moderate right-sided pleural effusion is noted, and a small loculated left-sided pleural effusion is seen.  There is diffuse interstitial prominence, likely reflecting mild pulmonary edema. There is  no evidence of pneumothorax.  No masses are identified; no abnormal focal contrast enhancement is seen.  The mediastinum is unremarkable in appearance.  No mediastinal lymphadenopathy is seen.  Visualized mediastinal nodes remain normal in size.  No pericardial effusion is identified.  The great vessels are grossly unremarkable in appearance.  No axillary lymphadenopathy is seen.  The visualized portions of the thyroid gland are unremarkable in appearance.  The visualized portions of the liver and spleen are unremarkable.  No acute osseous abnormalities are seen.  IMPRESSION:  1.  No evidence of pulmonary embolus. 2.  Moderate right-sided pleural effusion and small loculated left- sided  pleural effusion; diffuse interstitial prominence likely reflects pulmonary edema.   Original Report Authenticated By: Tonia Ghent, M.D.  Dg Chest Port 1 View  07/20/2013   CLINICAL DATA:  Status post cardiac surgery.  EXAM: PORTABLE CHEST - 1 VIEW  COMPARISON:  07/19/2013  FINDINGS: Cardiac silhouette is enlarged, but stable. No mediastinal widening.  There is vascular congestion centrally, mild interstitial thickening bilaterally and hazy bilateral lung base opacity, with the latter finding consistent with small effusions with associated atelectasis. There is no gross pneumothorax on this semi-erect study.  All lines and tubes have been removed with the exception of the right internal jugular sheath.  IMPRESSION: 1. Mild central vascular congestion without overt pulmonary edema. Lung base opacity consistent with small effusions and atelectasis. This is stable. 2. No mediastinal widening or gross pneumothorax. No evidence of an operative complication. 3. Support apparatus has been removed with the exception of the right internal jugular sheath.   Electronically Signed   By: Amie Portland M.D.   On: 07/20/2013 07:23      Future Appointments Provider Department Dept Phone   08/06/2013 9:20 AM Antoine Poche, MD [facility] [phone]   08/14/2013 2:30 PM [name], MD [facility] [phone]      Discharge Medications:   Medication List    STOP taking these medications       losartan 100 MG tablet  Commonly known as:  COZAAR     mupirocin ointment 2 %  Commonly known as:  BACTROBAN     Rivaroxaban 20 MG Tabs tablet  Commonly known as:  XARELTO      TAKE these medications       amiodarone 200 MG tablet  Commonly known as:  PACERONE  Take 1 tablet (200 mg total) by mouth 2 (two) times daily.     atorvastatin 10 MG tablet  Commonly known as:  LIPITOR  Take 20 mg by mouth daily.     beclomethasone 80 MCG/ACT inhaler   Commonly known as:  QVAR  Inhale 2 puffs into the lungs 2 (two) times daily.     buPROPion 150 MG 24 hr tablet  Commonly known as:  WELLBUTRIN XL  Take 150 mg by mouth daily.     dexlansoprazole 60 MG capsule  Commonly known as:  DEXILANT  Take 1 capsule (60 mg total) by mouth daily.     diltiazem 180 MG 24 hr capsule  Commonly known as:  CARDIZEM CD  Take 1 capsule (180 mg total) by mouth daily.     furosemide 40 MG tablet  Commonly known as:  LASIX  Take 1 tablet (40 mg total) by mouth daily.     guaiFENesin 600 MG 12 hr tablet  Commonly known as:  MUCINEX  Take 1 tablet (600 mg total) by mouth 2 (two)  times daily.     HYDROcodone-homatropine 5-1.5 MG/5ML syrup  Commonly known as:  HYCODAN  Take 5 mLs by mouth every 4 (four) hours as needed for cough.     hyoscyamine 0.125 MG SL tablet  Commonly known as:  LEVSIN SL  Place 1 tablet (0.125 mg total) under the tongue every 4 (four) hours as needed for cramping.     levothyroxine 150 MCG tablet  Commonly known as:  SYNTHROID, LEVOTHROID  Take 1 tablet (150 mcg total) by mouth daily before breakfast.     metFORMIN 500 MG tablet  Commonly known as:  GLUCOPHAGE  Take 1 tablet (500 mg total) by mouth 2 (two) times daily with a meal.     metoprolol tartrate 25 MG tablet  Commonly known as:  LOPRESSOR  Take 1 tablet (25 mg total) by mouth 2 (two) times daily.     multivitamin with minerals Tabs tablet  Take 1 tablet by mouth daily.     oxyCODONE 5 MG immediate release tablet  Commonly known as:  Oxy IR/ROXICODONE  Take 1-2 tablets (5-10 mg total) by mouth every 3 (three) hours as needed for pain.     potassium chloride SA 20 MEQ tablet  Commonly known as:  K-DUR,KLOR-CON  Take 1 tablet (20 mEq total) by mouth daily.     saccharomyces boulardii 250 MG capsule  Commonly known as:  FLORASTOR  Take 250 mg by mouth daily.     sucralfate 1 GM/10ML suspension  Commonly known as:  CARAFATE  Can take every 4 hours as  needed     VENTOLIN HFA 108 (90 BASE) MCG/ACT inhaler  Generic drug:  albuterol  Inhale 2 puffs into the lungs every 4 (four) hours as needed for wheezing.     warfarin 2.5 MG tablet  Commonly known as:  COUMADIN  Take 1 tablet (2.5 mg total) by mouth daily. Or as directed by [facility]           Follow Up Appointments: Follow-up Information   Follow up with Dina Rich, F, MD. Schedule an appointment as soon as possible for a visit in 2 weeks.   Specialty:  Cardiology   Contact information:   [address] [place] [address] [phone]       Follow up with [name], MD. (PA/LAT CXR to be taken (at [facility] which is in the same building as Dr. Sharee Pimple office) on 08/14/2013 at 1:30 pm;Appointment with Dr. Laneta Simmers is on 08/14/2013 at 2:30 pm)    Specialty:  Cardiothoracic Surgery   Contact information:   [address] [employer] [address] [place] [address] [phone]       Follow up with Carylon Perches, MD. (Call for a follow up appointment regarding pre op HGA1C 6.6 and TSH 6.931)    Specialty:  Internal Medicine   Contact information:   [address] [place] [address] [phone]       Follow up On 08/05/2013. (Home health RN will draw PT/INR and call results to [facility])       Signed: ZIMMERMAN,DONIELLE MPA-C 08/01/2013, 8:08 AM

## 2013-07-24 NOTE — Progress Notes (Signed)
Pt went for 3rd walk with rolling walker and standby assistance. Pt tolerated well.

## 2013-07-24 NOTE — Progress Notes (Signed)
Removed EPW. Ends intact. No bleeding. Patient stable, bedrest until 9:55. Frequent vitals set up. Will continue to monitor. Stanton Kidney R

## 2013-07-24 NOTE — Progress Notes (Signed)
Pt ambulated approx. 350 feet with rolling walker and assistance x1. Pt tolerated well. Pt's HR 90-low 100's.

## 2013-07-25 ENCOUNTER — Encounter (HOSPITAL_COMMUNITY): Payer: Self-pay | Admitting: Student

## 2013-07-25 ENCOUNTER — Inpatient Hospital Stay (HOSPITAL_COMMUNITY): Payer: Medicare Other

## 2013-07-25 DIAGNOSIS — I4891 Unspecified atrial fibrillation: Secondary | ICD-10-CM

## 2013-07-25 DIAGNOSIS — I4892 Unspecified atrial flutter: Secondary | ICD-10-CM

## 2013-07-25 LAB — GLUCOSE, CAPILLARY: Glucose-Capillary: 118 mg/dL — ABNORMAL HIGH (ref 70–99)

## 2013-07-25 LAB — PROTIME-INR
INR: 2.31 — ABNORMAL HIGH (ref 0.00–1.49)
Prothrombin Time: 24.6 seconds — ABNORMAL HIGH (ref 11.6–15.2)

## 2013-07-25 MED ORDER — LACTULOSE 10 GM/15ML PO SOLN
20.0000 g | Freq: Once | ORAL | Status: AC
  Administered 2013-07-25: 20 g via ORAL
  Filled 2013-07-25 (×2): qty 30

## 2013-07-25 MED ORDER — SODIUM CHLORIDE 0.9 % IV SOLN
INTRAVENOUS | Status: DC
  Administered 2013-07-25: 17:00:00 via INTRAVENOUS

## 2013-07-25 MED ORDER — DIPHENHYDRAMINE HCL 25 MG PO CAPS
25.0000 mg | ORAL_CAPSULE | Freq: Once | ORAL | Status: AC
  Administered 2013-07-25: 25 mg via ORAL
  Filled 2013-07-25: qty 1

## 2013-07-25 MED ORDER — DIGOXIN 0.25 MG/ML IJ SOLN
0.5000 mg | Freq: Once | INTRAMUSCULAR | Status: AC
  Administered 2013-07-25: 0.5 mg via INTRAVENOUS
  Filled 2013-07-25: qty 2

## 2013-07-25 MED ORDER — WARFARIN SODIUM 2.5 MG PO TABS
2.5000 mg | ORAL_TABLET | Freq: Every day | ORAL | Status: DC
  Administered 2013-07-25 – 2013-07-27 (×3): 2.5 mg via ORAL
  Filled 2013-07-25 (×4): qty 1

## 2013-07-25 MED ORDER — DIGOXIN 125 MCG PO TABS
0.1250 mg | ORAL_TABLET | Freq: Every day | ORAL | Status: DC
  Administered 2013-07-26 – 2013-07-28 (×3): 0.125 mg via ORAL
  Filled 2013-07-25 (×4): qty 1

## 2013-07-25 MED ORDER — DILTIAZEM HCL 60 MG PO TABS
60.0000 mg | ORAL_TABLET | Freq: Four times a day (QID) | ORAL | Status: DC
  Administered 2013-07-25 – 2013-07-29 (×16): 60 mg via ORAL
  Filled 2013-07-25 (×20): qty 1

## 2013-07-25 MED ORDER — DIGOXIN 0.25 MG/ML IJ SOLN
0.2500 mg | Freq: Once | INTRAMUSCULAR | Status: AC
  Administered 2013-07-25: 0.25 mg via INTRAVENOUS
  Filled 2013-07-25: qty 1

## 2013-07-25 MED ORDER — FUROSEMIDE 10 MG/ML IJ SOLN
INTRAMUSCULAR | Status: AC
  Administered 2013-07-25: 40 mg
  Filled 2013-07-25: qty 4

## 2013-07-25 NOTE — Progress Notes (Signed)
1340 Heart rate still elevated--130s atrial fib. Will hold ambulation and follow up tomorrow. Luetta Nutting RN BSN 1:40 PM 07/25/2013

## 2013-07-25 NOTE — Consult Note (Signed)
 ELECTROPHYSIOLOGY CONSULT NOTE  Patient ID: [name] MRN: [id number], DOB/AGE: 69/04/1946   Admit date: 07/18/2013 Date of Consult: 07/25/2013  Primary Physician: Roy Fagan, MD Primary Cardiologist: Jonathan Branch, MD Reason for Consultation: Persistent AFib/Flutter post AVR, MVR  History of Present Illness [name] is a 67 y.o. female with rheumatic valvular heart disease status post digital mitral commisurotomy via left thoracotomy incision at age 14. She also has history of prior CVA April 2013, HTN and CKD.   She reports a 2-year history of shortness of breath with significant exertion such as going up stairs. She presented on 07/10/2013 with a 3-week history of worsening DOE and lower extremity swelling. She was experiencing marked SOB with walking on level ground. On admission, chest CT was negative for pulmonary embolism but confirmed pulmonary edema. She improved with diuresis. She underwent cardiac catheterization and TEE. Cardiac cath showed normal coronary arteries. TEE revealed she had moderate to severe mitral stenosis with moderate aortic stenosis and mild to moderate aortic insufficiency. EF 35-40%. TCTS recommended tissue AVR and MVR which was done on 07/18/2013.   Post-operatively she has developed atrial flutter / atrial fibrillation and is currently on amiodarone and diltiazem with persistently elevated rates. EP has been asked to see for recommendations regarding rhythm management.   Past Medical History Past Medical History  Diagnosis Date  . Rheumatic heart disease     a. 07/2013 Echo: Ef 55-60%, Mod AS/AI, Mod-Sev MS, sev dil LA, PASP 58;  b. 07/2013 TEE: EF 35-40%, mild-mod AS/AI, mod-sev MS, LA smoke;  c. 07/2013 Cath: elev R heart pressures, nl cors.  . Gastroesophageal reflux disease   . Hyperlipidemia   . Cardioembolic stroke 01/2012    Right frontal in 01/2012; normal carotid ultrasound; possible LAA thrombus by TEE; virtual complete neurologic  recovery  . Fasting hyperglycemia     120 fasting  . Chronic kidney disease, stage 2, mildly decreased GFR     GFR of approximately 60  . Breast carcinoma 1995    1995  . Depression   . Diverticulosis of colon (without mention of hemorrhage) 2012    Dr. Rehman  . Gastroparesis   . Hemorrhoids   . Anemia   . Shortness of breath   . Hypertension     pt denies 05/30/13     Dr Branch  [place]    Past Surgical History Past Surgical History  Procedure Laterality Date  . Mitral valve surgery  1962    [facility], closed mitral valvulotomy by finger fracture  . Cardiac catheterization    . Dilation and curettage of uterus    . Breast lumpectomy Right 1995  . Tubal ligation  1973  . Tee without cardioversion  01/24/2012    Procedure: TRANSESOPHAGEAL ECHOCARDIOGRAM (TEE);  Surgeon: Brian S Crenshaw, MD;  Location: [facility];  Service: Cardiovascular;  Laterality: N/A;  . Colonoscopy  2012    Negative screening procedure  . Esophageal manometry N/A 06/17/2013    Procedure: ESOPHAGEAL MANOMETRY (EM);  Surgeon: David R Patterson, MD;  Location: [facility];  Service: Endoscopy;  Laterality: N/A;  . Tee without cardioversion N/A 07/11/2013    Procedure: TRANSESOPHAGEAL ECHOCARDIOGRAM (TEE);  Surgeon: Philip J Nahser, MD;  Location: [facility] ENDOSCOPY;  Service: Cardiovascular;  Laterality: N/A;  . Aortic valve replacement N/A 07/18/2013    Procedure: AORTIC VALVE REPLACEMENT (AVR);  Surgeon: Bryan K Bartle, MD;  Location: [facility] OR;  Service: Open Heart Surgery;  Laterality: N/A;  . Intraoperative transesophageal echocardiogram   N/A 07/18/2013    Procedure: INTRAOPERATIVE TRANSESOPHAGEAL ECHOCARDIOGRAM;  Surgeon: Bryan K Bartle, MD;  Location: [facility] OR;  Service: Open Heart Surgery;  Laterality: N/A;  . Mitral valve replacement N/A 07/18/2013    Procedure: MITRAL VALVE (MV) REPLACEMENT;  Surgeon: Bryan K Bartle, MD;  Location: [facility] OR;  Service: Open Heart Surgery;  Laterality: N/A;      Allergies/Intolerances No Known Allergies  Inpatient Medications . amiodarone  400 mg Oral BID  . aspirin EC  81 mg Oral Daily  . atorvastatin  20 mg Oral Daily  . buPROPion  150 mg Oral Daily  . diltiazem  60 mg Oral Q6H  . docusate sodium  200 mg Oral Daily  . famotidine  20 mg Oral BID  . fluticasone  2 puff Inhalation BID  . insulin aspart  0-15 Units Subcutaneous TID WC  . levothyroxine  112 mcg Oral QAC breakfast  . metFORMIN  500 mg Oral BID WC  . multivitamin with minerals  1 tablet Oral Daily  . saccharomyces boulardii  250 mg Oral Daily  . sodium chloride  3 mL Intravenous Q12H  . warfarin  2.5 mg Oral q1800  . Warfarin - Physician Dosing Inpatient   Does not apply q1800    Family History Family History  Problem Relation Age of Onset  . Heart disease Brother 50    MI  . Rheum arthritis Maternal Grandmother   . Asthma Maternal Grandfather   . Hypothyroidism Mother   . Diabetes Sister   . Cirrhosis Father   . Cervical cancer Sister     malignant neoplasm of cervix uteri     Social History Social History  . Marital Status: Married   Social History Main Topics  . Smoking status: Never Smoker   . Smokeless tobacco: Never Used  . Alcohol Use: No  . Drug Use: No   Review of Systems General: No chills, fever, night sweats or weight changes  Cardiovascular:  No chest pain, dyspnea on exertion, edema, orthopnea, palpitations, paroxysmal nocturnal dyspnea Dermatological: No rash, lesions or masses Respiratory: No cough, dyspnea Urologic: No hematuria, dysuria Abdominal: No nausea, vomiting, diarrhea, bright red blood per rectum, melena, or hematemesis Neurologic: No visual changes, weakness, changes in mental status All other systems reviewed and are otherwise negative except as noted above.  Physical Exam Vitals: Blood pressure 142/74, pulse 133, temperature 98.4 F (36.9 C), temperature source Oral, resp. rate 18, height 5' 2" (1.575 m), weight 177 lb  (80.287 kg), SpO2 94.00%.  General: Well developed, well appearing 67 y.o. female in no acute distress. HEENT: Normocephalic, atraumatic. EOMs intact. Sclera nonicteric. Oropharynx clear. Neck: Supple without bruits. No JVD. Lungs: Respirations regular and unlabored. Diminished breath sounds at bases but CTA bilaterally. No wheezes, rales or rhonchi. Heart: Tachycardic. S1, S2 present. No murmurs, rub, S3 or S4. Abdomen: Soft, non-tender, non-distended. BS present x 4 quadrants. No hepatosplenomegaly.  Extremities: No clubbing or cyanosis. 1-2+ peripheral edema bilaterally. DP/PT/Radials 2+ and equal bilaterally. Psych: Normal affect. Neuro: Alert and oriented X 3. Moves all extremities spontaneously. Musculoskeletal: No kyphosis. Skin: Intact. Warm and dry. No rashes or petechiae in exposed areas.   Labs Lab Results  Component Value Date   WBC 12.1* 07/20/2013   HGB 10.5* 07/20/2013   HCT 31.9* 07/20/2013   MCV 87.9 07/20/2013   PLT 107* 07/20/2013    Recent Labs Lab 07/23/13 0535  NA 133*  K 3.7  CL 94*  CO2 30  BUN 10    CREATININE 0.76  CALCIUM 8.6  GLUCOSE 136*    Recent Labs  07/24/13 1439  TSH 6.931*    Recent Labs  07/25/13 0400  INR 2.31*    Radiology/Studies Dg Chest Port 1 View 07/20/2013   IMPRESSION: 1. Mild central vascular congestion without overt pulmonary edema. Lung base opacity consistent with small effusions and atelectasis. This is stable. 2. No mediastinal widening or gross pneumothorax. No evidence of an operative complication. 3. Support apparatus has been removed with the exception of the right internal jugular sheath.    Electronically Signed   By: David  Ormond M.D.   On: 07/20/2013 07:23   Dg Chest 2 View 07/17/2013    IMPRESSION: No active cardiopulmonary disease.    Electronically Signed   By: James  Green M.D.   On: 07/17/2013 09:36   Ct Angio Chest Pe W/cm &/or Wo Cm 07/07/2013   IMPRESSION:  1.  No evidence of pulmonary  embolus. 2.  Moderate right-sided pleural effusion and small loculated left- sided pleural effusion; diffuse interstitial prominence likely reflects pulmonary edema.    Original Report Authenticated By: Jeffrey Chang, M.D.   Transesophageal echocardiogram 07/11/2013 Study Conclusions - Left ventricle: Systolic function was moderately reduced. The estimated ejection fraction was in the range of 35% to 40%. - Aortic valve: There was mild to moderate stenosis. Mild to moderate regurgitation. - Mitral valve: The findings are consistent with moderate to severe stenosis. Valve area by pressure half-time: 1.45cm^2. - Left atrium: There was spontaneous echo contrast ("smoke"). - Atrial septum: No defect or patent foramen ovale was identified. - Tricuspid valve: No evidence of vegetation.  12-lead ECG pre-op - SR with first degree AV block and prolonged QT 12-lead ECG post-op day 1 - NSR 12-lead ECG 07/23/2013 - atrial fibrillation at 72 bpm  Telemetry shows persistent atrial flutter with rates consistently >120 in last 24-48 hours  Assessment and Plan 1. Atrial flutter / atrial fibrillation with RVR and very difficult to slow down 2. Rheumatic valvular heart disease s/p tissue AVR and MVR this admission 3. LV dysfunction, EF 35-40%, pre-operatively 4. Subtherapeutic INR - until today 5. Prior CVA 6. CKD  Ms. Beshara currently has atrial flutter with poorly controlled rates. She initially had atrial fibrillation by ECG on 07/23/2013. She is now on amiodarone, diltiazem and digoxin. She was tried on metoprolol which was ineffective. In the setting of recent valve surgery, LV dysfunction and subtherapeutic INR (until today), would continue with amiodarone loading. Treatment strategies were reviewed with her in detail regarding possible TEE-guided DCCV in hopes of restoring SR.  Dr. Taylor was in to interview and examine the patient. Please see recommendations below. Signed, EDMISTEN,  BROOKE, PA-C 07/25/2013, 2:52 PM  EP Attending  Patient seen and examined. I have reviewed the findings as noted above and discussed the issues with the patient and her daughter. She has had refractory atrial flutter and fibrillation with a rapid ventricular response. She feels poorly. She is now on multiple rate slowing meds. I have recommended TEE guided DCCV. She may not hold NSR as she has only had 5 doses of amio but she will feel better in NSR. Continue amio. Watch for bradycardia as sometimes this will occur suddenly on all of these AV nodal blocking drugs.  Gregg Taylor,M.D.  

## 2013-07-25 NOTE — Progress Notes (Addendum)
      [address]       [place] [address]             [phone]        7 Days Post-Op Procedure(s) (LRB): AORTIC VALVE REPLACEMENT (AVR) (N/A) INTRAOPERATIVE TRANSESOPHAGEAL ECHOCARDIOGRAM (N/A) MITRAL VALVE (MV) REPLACEMENT (N/A)  Subjective: Patient without complaints.  Objective: Vital signs in last 24 hours: Temp:  [96.7 F (35.9 C)-98.2 F (36.8 C)] 96.7 F (35.9 C) (10/23 0522) Pulse Rate:  [86-132] 86 (10/22 2114) Cardiac Rhythm:  [-] Atrial fibrillation (10/22 2100) Resp:  [18-22] 22 (10/23 0522) BP: (116-149)/(50-99) 142/76 mmHg (10/23 0522) SpO2:  [95 %-100 %] 100 % (10/23 0522) Weight:  [80.287 kg (177 lb)] 80.287 kg (177 lb) (10/23 0522)  Pre op weight 76 kg Current Weight  07/25/13 80.287 kg (177 lb)      Intake/Output from previous day: 10/22 0701 - 10/23 0700 In: 840 [P.O.:840] Out: 1650 [Urine:1650]   Physical Exam:  Cardiovascular: Tachycardic Pulmonary: Diminished at bases; no rales, wheezes, or rhonchi. Abdomen: Soft, non tender, bowel sounds present. Extremities: Trace bilateral lower extremity edema. Wounds: Clean and dry.  No erythema or signs of infection.  Lab Results: CBC:No results found for this basename: WBC, HGB, HCT, PLT,  in the last 72 hours BMET:   Recent Labs  07/23/13 0535  NA 133*  K 3.7  CL 94*  CO2 30  GLUCOSE 136*  BUN 10  CREATININE 0.76  CALCIUM 8.6    PT/INR:  Lab Results  Component Value Date   INR 2.31* 07/25/2013   INR 1.98* 07/24/2013   INR 1.31 07/23/2013   ABG:  INR: Will add last result for INR, ABG once components are confirmed Will add last 4 CBG results once components are confirmed  Assessment/Plan:  1. CV - She has mostly been A fl/fib with CVR. This am, however, her HR is in the 130's. On Amiodarone 400 bid, Cardizem 30 qid,and Coumadin. INR increased from 1.98 to 2.31. Will increase Cardizem to 60 qid for better HR control.Will continue with Coumadin 2.5  tonight.  2.  Pulmonary - Weaned to room air. Encourage incentive spirometer. 3. Volume Overload - Is below pre op weight so will stop Lasix. 4.  Acute blood loss anemia - Last H and H 10.5 and 31.9 6.DM-CBGs 109/145/125. Pre op HGA1C 6.6. Continue on Metformin 500 bid. She will need follow up with medical doctor as outpatient. 7.Continue CRPI 8.Possible discharge in am  ZIMMERMAN,DONIELLE MPA-C 07/25/2013,7:51 AM    Chart reviewed, patient examined, agree with above. She is in A-flutter 130's this afternoon on amio, cardizem 60 every 6hrs. Will add digoxin for rate control. Lopressor did nothing to control her rate.  Could consider cardioversion but she would probably go right back into it. She is therapeutic on coumadin. Status discussed with daughter.  Her only complaints is off pain across upper abdomen and lower chest which sounds like gas. She has not had a BM since surgery.

## 2013-07-25 NOTE — Progress Notes (Signed)
Patient had sudden SOB; assessed patient; findings-crackles on left side, 94% on RA, HR 66, BP 142/74; paged on call MD; Lasix 40 IV one time dose ordered; breathing tx ordered; patient is stable; will continue to monitor.  BARNETT, Geroge Baseman

## 2013-07-25 NOTE — Progress Notes (Signed)
Patient continues to complain of SOB. V/S assessed 2L n/c @ 97%, Hrt rate 67 AFIB, lungs clear. Patient complaining of midsternal pain 7/10. Pt administered 10 mg oxycodon. RN asked pt if experiencing anxiety, pt answered "yes". MD on call notified of pt's complaints. New orders for 2 view chest xray in am and 25mg  of Benadryl. Pt instructed to use I/S.     Akyia Borelli,RN,MSN

## 2013-07-25 NOTE — Progress Notes (Signed)
Patient complaining of Shortness of breath. V/S assessed pt 94% on 2L, lungs clear bilateral, no labored breathing, hrt rate 85,afib. Pt states SOB occurred after getting up to [facility]. MD on call Dr. Morton Peters notified. New orders given for portable chest xray. Will continue to monitor.     Kurt Hoffmeier,RN,MSN

## 2013-07-26 ENCOUNTER — Encounter (HOSPITAL_COMMUNITY)
Admission: RE | Disposition: A | Discharge status: Home / Self Care | Payer: Self-pay | Source: Ambulatory Visit | Attending: Surgery

## 2013-07-26 ENCOUNTER — Inpatient Hospital Stay (HOSPITAL_COMMUNITY): Payer: Medicare Other | Admitting: Anesthesiology

## 2013-07-26 ENCOUNTER — Encounter (HOSPITAL_COMMUNITY): Payer: Medicare Other | Admitting: Anesthesiology

## 2013-07-26 ENCOUNTER — Inpatient Hospital Stay (HOSPITAL_COMMUNITY): Payer: Medicare Other

## 2013-07-26 ENCOUNTER — Encounter (HOSPITAL_COMMUNITY): Payer: Self-pay | Admitting: Anesthesiology

## 2013-07-26 DIAGNOSIS — I4891 Unspecified atrial fibrillation: Secondary | ICD-10-CM

## 2013-07-26 HISTORY — PX: TEE WITHOUT CARDIOVERSION: SHX5443

## 2013-07-26 HISTORY — PX: CARDIOVERSION: SHX1299

## 2013-07-26 LAB — PROTIME-INR
INR: 2.06 — ABNORMAL HIGH (ref 0.00–1.49)
Prothrombin Time: 22.6 seconds — ABNORMAL HIGH (ref 11.6–15.2)

## 2013-07-26 LAB — GLUCOSE, CAPILLARY
Glucose-Capillary: 103 mg/dL — ABNORMAL HIGH (ref 70–99)
Glucose-Capillary: 127 mg/dL — ABNORMAL HIGH (ref 70–99)
Glucose-Capillary: 150 mg/dL — ABNORMAL HIGH (ref 70–99)

## 2013-07-26 SURGERY — ECHOCARDIOGRAM, TRANSESOPHAGEAL
Anesthesia: General

## 2013-07-26 MED ORDER — LEVOTHYROXINE SODIUM 150 MCG PO TABS
150.0000 ug | ORAL_TABLET | Freq: Every day | ORAL | Status: DC
  Administered 2013-07-27 – 2013-08-03 (×8): 150 ug via ORAL
  Filled 2013-07-26 (×10): qty 1

## 2013-07-26 MED ORDER — DIPHENHYDRAMINE HCL 50 MG/ML IJ SOLN
INTRAMUSCULAR | Status: AC
  Filled 2013-07-26: qty 1

## 2013-07-26 MED ORDER — PROPOFOL 10 MG/ML IV BOLUS
INTRAVENOUS | Status: DC | PRN
  Administered 2013-07-26: 40 mg via INTRAVENOUS

## 2013-07-26 MED ORDER — LIDOCAINE HCL (CARDIAC) 20 MG/ML IV SOLN
INTRAVENOUS | Status: DC | PRN
  Administered 2013-07-26: 20 mg via INTRAVENOUS

## 2013-07-26 MED ORDER — LIDOCAINE VISCOUS 2 % MT SOLN
OROMUCOSAL | Status: AC
  Filled 2013-07-26: qty 15

## 2013-07-26 MED ORDER — FENTANYL CITRATE 0.05 MG/ML IJ SOLN
INTRAMUSCULAR | Status: AC
  Filled 2013-07-26: qty 2

## 2013-07-26 MED ORDER — MIDAZOLAM HCL 5 MG/ML IJ SOLN
INTRAMUSCULAR | Status: AC
  Filled 2013-07-26: qty 2

## 2013-07-26 MED ORDER — FENTANYL CITRATE 0.05 MG/ML IJ SOLN
INTRAMUSCULAR | Status: DC | PRN
  Administered 2013-07-26: 25 ug via INTRAVENOUS

## 2013-07-26 MED ORDER — LIDOCAINE VISCOUS 2 % MT SOLN
OROMUCOSAL | Status: DC | PRN
  Administered 2013-07-26: 10 mL via OROMUCOSAL

## 2013-07-26 MED ORDER — MIDAZOLAM HCL 10 MG/2ML IJ SOLN
INTRAMUSCULAR | Status: DC | PRN
  Administered 2013-07-26: 2 mg via INTRAVENOUS
  Administered 2013-07-26: 1 mg via INTRAVENOUS

## 2013-07-26 NOTE — Preoperative (Signed)
Beta Blockers   Reason not to administer Beta Blockers:Not Applicable, D/C'd post op

## 2013-07-26 NOTE — Progress Notes (Signed)
Patient: [name] Date of Encounter: 07/26/2013, 7:48 AM Admit date: 07/18/2013     Subjective  [name] reports acute CP and SOB overnight after getting up to bedside commode. CP has resolved. SOB persists but is improved. She denies palpitations.   Objective  Physical Exam: Vitals: BP 112/84  Pulse 75  Temp(Src) 98.4 F (36.9 C) (Oral)  Resp 20  Ht 5\' 2"  (1.575 m)  Wt 182 lb 3.2 oz (82.645 kg)  BMI 33.32 kg/m2  SpO2 96% General: Well developed, well appearing 67 year old female in no acute distress. Neck: Supple. JVD not elevated. Lungs: Diminished breath sounds at bases but clear bilaterally to auscultation without wheezes, rales, or rhonchi. Breathing is unlabored. Heart: Irregular S1 S2 without murmurs, rubs, or gallops.  Abdomen: Soft, non-distended. Extremities: No clubbing or cyanosis. 1+ peripheral edema bilaterally.  Distal pedal pulses are 2+ and equal bilaterally. Neuro: Alert and oriented X 3. Moves all extremities spontaneously. No focal deficits.  Intake/Output:  Intake/Output Summary (Last 24 hours) at 07/26/13 0748 Last data filed at 07/25/13 2200  Gross per 24 hour  Intake    900 ml  Output    800 ml  Net    100 ml    Inpatient Medications:  . amiodarone  400 mg Oral BID  . aspirin EC  81 mg Oral Daily  . atorvastatin  20 mg Oral Daily  . buPROPion  150 mg Oral Daily  . digoxin  0.125 mg Oral Daily  . diltiazem  60 mg Oral Q6H  . docusate sodium  200 mg Oral Daily  . famotidine  20 mg Oral BID  . fluticasone  2 puff Inhalation BID  . insulin aspart  0-15 Units Subcutaneous TID WC  . levothyroxine  112 mcg Oral QAC breakfast  . metFORMIN  500 mg Oral BID WC  . multivitamin with minerals  1 tablet Oral Daily  . saccharomyces boulardii  250 mg Oral Daily  . sodium chloride  3 mL Intravenous Q12H  . warfarin  2.5 mg Oral q1800  . Warfarin - Physician Dosing Inpatient   Does not apply q1800   . sodium chloride 20 mL/hr at 07/25/13  1713    Labs:    Component Value Date/Time   WBC 12.1* 07/20/2013 0430   RBC 3.63* 07/20/2013 0430   HGB 10.5* 07/20/2013 0430   HCT 31.9* 07/20/2013 0430   PLT 107* 07/20/2013 0430   MCV 87.9 07/20/2013 0430   MCH 28.9 07/20/2013 0430   MCHC 32.9 07/20/2013 0430   RDW 15.3 07/20/2013 0430   LYMPHSABS 1.5 07/07/2013 0000   MONOABS 0.5 07/07/2013 0000   EOSABS 0.4 07/07/2013 0000   BASOSABS 0.0 07/07/2013 0000      Component Value Date/Time   NA 133* 07/23/2013 0535   K 3.7 07/23/2013 0535   CL 94* 07/23/2013 0535   CO2 30 07/23/2013 0535   GLUCOSE 136* 07/23/2013 0535   BUN 10 07/23/2013 0535   CREATININE 0.76 07/23/2013 0535   CREATININE 1.29* 05/01/2013 1550   CALCIUM 8.6 07/23/2013 0535   GFRNONAA 84* 07/23/2013 0535   GFRAA >90 07/23/2013 0535    Recent Labs  07/24/13 1439  TSH 6.931*    Recent Labs  07/26/13 0530  INR 2.06*    Radiology/Studies: Dg Chest 2 View 07/26/2013    IMPRESSION: 1. Improved pulmonary edema with persistent pulmonary venous congestion.  2. Grossly unchanged small to moderate size right-sided effusion with associated right basilar  atelectasis.    Electronically Signed   By: Simonne Come M.D.   On: 07/26/2013 07:36   Dg Chest Port 1 View 07/25/2013    IMPRESSION: Persistent cardiac enlargement, vascular congestion, mild interstitial edema and right greater than left pleural effusions.   Electronically Signed   By: Loralie Champagne M.D.   On: 07/25/2013 20:54   Telemetry: AFib in the 80s   Assessment and Plan  1. Atrial flutter / atrial fibrillation with RVR  - improved rate control this AM - would benefit from restoration of SR and Dr. Ladona Ridgel recommended TEE-guided DCCV 2. Rheumatic valvular heart disease s/p tissue AVR and MVR this admission  3. LV dysfunction, EF 35-40%, pre-operatively  4. Subtherapeutic INR - until yesterday  5. Prior CVA  6. CKD   [name] has persistent atrial flutter / atrial fibrillation. Her rate is  improved this AM. She is now on amiodarone, diltiazem and digoxin. She was tried on metoprolol which was ineffective. In the setting of recent valve surgery, LV dysfunction and recent subtherapeutic INR, would benefit from restoration of SR. Continue with amiodarone loading. Plan for TEE-guided DCCV today in hopes of restoring SR.  Dr. Ladona Ridgel to see Signed, Exie Parody  EP Attending  Patient seen and examined. Agree with above.  Leonia Reeves.D.

## 2013-07-26 NOTE — Transfer of Care (Signed)
Immediate Anesthesia Transfer of Care Note  Patient: [name]  Procedure(s) Performed: Procedure(s): TRANSESOPHAGEAL ECHOCARDIOGRAM (TEE) (N/A) CARDIOVERSION (N/A)  Patient Location: Endoscopy Unit  Anesthesia Type:General  Level of Consciousness: sedated  Airway & Oxygen Therapy: Patient Spontanous Breathing and Patient connected to nasal cannula oxygen  Post-op Assessment: Report given to PACU RN and Post -op Vital signs reviewed and stable  Post vital signs: Reviewed  Complications: No apparent anesthesia complications

## 2013-07-26 NOTE — Progress Notes (Addendum)
[address]       [place] [address]             [phone]      8 Days Post-Op  Procedure(s) (LRB): AORTIC VALVE REPLACEMENT (AVR) (N/A) INTRAOPERATIVE TRANSESOPHAGEAL ECHOCARDIOGRAM (N/A) MITRAL VALVE (MV) REPLACEMENT (N/A) Subjective: Some SOB, feels increasingly fatigued, no recent BM-feels constipated   Objective  Telemetry afib with CVR  Temp:  [98.4 F (36.9 C)-98.7 F (37.1 C)] 98.4 F (36.9 C) (10/24 0535) Pulse Rate:  [66-133] 75 (10/24 0535) Resp:  [18-20] 20 (10/24 0535) BP: (112-142)/(60-84) 112/84 mmHg (10/24 0535) SpO2:  [94 %-97 %] 96 % (10/24 0535) Weight:  [182 lb 3.2 oz (82.645 kg)] 182 lb 3.2 oz (82.645 kg) (10/24 0535)   Intake/Output Summary (Last 24 hours) at 07/26/13 0845 Last data filed at 07/25/13 2200  Gross per 24 hour  Intake    480 ml  Output    800 ml  Net   -320 ml       General appearance: alert, cooperative, fatigued and no distress Heart: irregularly irregular rhythm Lungs: dim in bases Abdomen: soft, nontender, nondistended Extremities: mild edema Incisions: healing well  Lab Results: No results found for this basename: NA, K, CL, CO2, GLUCOSE, BUN, CREATININE, CALCIUM, MG, PHOS,  in the last 72 hours No results found for this basename: AST, ALT, ALKPHOS, BILITOT, PROT, ALBUMIN,  in the last 72 hours No results found for this basename: LIPASE, AMYLASE,  in the last 72 hours No results found for this basename: WBC, NEUTROABS, HGB, HCT, MCV, PLT,  in the last 72 hours No results found for this basename: CKTOTAL, CKMB, TROPONINI,  in the last 72 hours No components found with this basename: POCBNP,  No results found for this basename: DDIMER,  in the last 72 hours No results found for this basename: HGBA1C,  in the last 72 hours No results found for this basename: CHOL, HDL, LDLCALC, TRIG, CHOLHDL,  in the last 72 hours  Recent Labs  07/24/13 1439  TSH 6.931*   No results found for this  basename: VITAMINB12, FOLATE, FERRITIN, TIBC, IRON, RETICCTPCT,  in the last 72 hours  Medications: Scheduled . amiodarone  400 mg Oral BID  . aspirin EC  81 mg Oral Daily  . atorvastatin  20 mg Oral Daily  . buPROPion  150 mg Oral Daily  . digoxin  0.125 mg Oral Daily  . diltiazem  60 mg Oral Q6H  . docusate sodium  200 mg Oral Daily  . famotidine  20 mg Oral BID  . fluticasone  2 puff Inhalation BID  . insulin aspart  0-15 Units Subcutaneous TID WC  . levothyroxine  112 mcg Oral QAC breakfast  . metFORMIN  500 mg Oral BID WC  . multivitamin with minerals  1 tablet Oral Daily  . saccharomyces boulardii  250 mg Oral Daily  . sodium chloride  3 mL Intravenous Q12H  . warfarin  2.5 mg Oral q1800  . Warfarin - Physician Dosing Inpatient   Does not apply q1800     Radiology/Studies:  Dg Chest 2 View  07/26/2013   CLINICAL DATA:  Shortness of breath  EXAM: CHEST  2 VIEW  COMPARISON:  07/25/2013; 07/20/2013; 07/19/2013; chest CT-07/07/2013  FINDINGS: Grossly unchanged cardiac silhouette and mediastinal contours post median sternotomy and mitral valve replacements. Overall improved aeration of the lungs with persistent mild pulmonary venous congestion. Grossly unchanged small to moderate size partially loculated  right-sided pleural effusion. Minimal bibasilar evidence opacities, right greater than left. No new focal airspace opacities. No pneumothorax. Unchanged bones. Right axillary surgical clips.  IMPRESSION: 1. Improved pulmonary edema with persistent pulmonary venous congestion.  2. Grossly unchanged small to moderate size right-sided effusion with associated right basilar atelectasis.   Electronically Signed   By: Simonne Come M.D.   On: 07/26/2013 07:36   Dg Chest Port 1 View  07/25/2013   CLINICAL DATA:  Shortness of breath.  EXAM: PORTABLE CHEST - 1 VIEW  COMPARISON:  07/20/2013.  FINDINGS: The heart is enlarged but stable. The right IJ catheter has been removed. Stable surgical  changes related to valve replacement surgery. There is persistent vascular congestion, probable mild interstitial pulmonary edema and right greater than left pleural effusions.  IMPRESSION: Persistent cardiac enlargement, vascular congestion, mild interstitial edema and right greater than left pleural effusions.   Electronically Signed   By: Loralie Champagne M.D.   On: 07/25/2013 20:54    INR: Will add last result for INR, ABG once components are confirmed Will add last 4 CBG results once components are confirmed  Assessment/Plan: S/P Procedure(s) (LRB): AORTIC VALVE REPLACEMENT (AVR) (N/A) INTRAOPERATIVE TRANSESOPHAGEAL ECHOCARDIOGRAM (N/A) MITRAL VALVE (MV) REPLACEMENT (N/A)  1 Rhythm- plans as outlined by EP for cardioversion 2 volume overload- off lasix currently, follow I/O's - negative yesterday 3 elevated TSH on amio and levothyroxine- will increase to 150 mcg daily 4 sugars- controlled 5 cont pulm toilet/rehab     LOS: 8 days    GOLD,WAYNE E 10/24/20148:45 AM  She is still in A-fib/flutter with rate up to 130. EP plans TEE directed DCCV today. She has not had a BM since surgery. Would give some lactulose tomorrow if needed. She still has excess volume but did not get diuretic today due to NPO status. Her CXR shows a small right pleural effusion and right basilar atelectasis. Hopefully further diuretic and IS will take care of that. Therapeutic on coumadin.  Will check CMET, CBC, Digoxin levels in am.

## 2013-07-26 NOTE — Progress Notes (Signed)
Patient called out stating she was feeling SOB; crackles heard on right side; sudden sharp pain in chest-patient stated it was constant when I asked to describe the pain; PA notified; called RT for breathing tx; will continue to monitor.  BARNETT, Geroge Baseman

## 2013-07-26 NOTE — Anesthesia Preprocedure Evaluation (Addendum)
Anesthesia Evaluation  Patient identified by MRN, date of birth, ID band Patient awake    Reviewed: Allergy & Precautions, H&P , NPO status , Patient's Chart, lab work & pertinent test results, reviewed documented beta blocker date and time   History of Anesthesia Complications Negative for: history of anesthetic complications  Airway Mallampati: II TM Distance: >3 FB Neck ROM: Full    Dental  (+) Edentulous Upper and Dental Advisory Given   Pulmonary shortness of breath, asthma ,          Cardiovascular hypertension, Pt. on medications + CABG and + Peripheral Vascular Disease  AVR/MV R 07/18/13   Neuro/Psych CVA    GI/Hepatic GERD-  Medicated and Controlled,  Endo/Other  Hypothyroidism   Renal/GU Renal disease     Musculoskeletal   Abdominal   Peds  Hematology   Anesthesia Other Findings   Reproductive/Obstetrics                          Anesthesia Physical Anesthesia Plan  ASA: III  Anesthesia Plan: General   Post-op Pain Management:    Induction:   Airway Management Planned: Mask and Natural Airway  Additional Equipment:   Intra-op Plan:   Post-operative Plan:   Informed Consent: I have reviewed the patients History and Physical, chart, labs and discussed the procedure including the risks, benefits and alternatives for the proposed anesthesia with the patient or authorized representative who has indicated his/her understanding and acceptance.   Dental advisory given  Plan Discussed with: CRNA, Anesthesiologist and Surgeon  Anesthesia Plan Comments:         Anesthesia Quick Evaluation

## 2013-07-26 NOTE — Anesthesia Postprocedure Evaluation (Signed)
  Anesthesia Post-op Note  Patient: [name]  Procedure(s) Performed: Procedure(s): TRANSESOPHAGEAL ECHOCARDIOGRAM (TEE) (N/A) CARDIOVERSION (N/A)  Patient Location: Endoscopy Unit  Anesthesia Type:General  Level of Consciousness: sedated and patient cooperative  Airway and Oxygen Therapy: Patient Spontanous Breathing and Patient connected to nasal cannula oxygen  Post-op Pain: none  Post-op Assessment: Post-op Vital signs reviewed, Patient's Cardiovascular Status Stable, Respiratory Function Stable, Patent Airway and No signs of Nausea or vomiting  Post-op Vital Signs: Reviewed  Complications: No apparent anesthesia complications

## 2013-07-26 NOTE — Progress Notes (Signed)
Ambulation held today, discussed with Herbert Seta, RN, for TEE at 1500 today.  Will resume ambulation/education tomorrow if appropriate.

## 2013-07-26 NOTE — H&P (View-Only) (Signed)
ELECTROPHYSIOLOGY CONSULT NOTE  Patient ID: [name] MRN: [id number], DOB/AGE: [birth year]   Admit date: 07/18/2013 Date of Consult: 07/25/2013  Primary Physician: Carylon Perches, MD Primary Cardiologist: Dina Rich, MD Reason for Consultation: Persistent AFib/Flutter post AVR, MVR  History of Present Illness [name] is a 67 y.o. female with rheumatic valvular heart disease status post digital mitral commisurotomy via left thoracotomy incision at age 31. She also has history of prior CVA April 2013, HTN and CKD.   She reports a 2-year history of shortness of breath with significant exertion such as going up stairs. She presented on 07/10/2013 with a 3-week history of worsening DOE and lower extremity swelling. She was experiencing marked SOB with walking on level ground. On admission, chest CT was negative for pulmonary embolism but confirmed pulmonary edema. She improved with diuresis. She underwent cardiac catheterization and TEE. Cardiac cath showed normal coronary arteries. TEE revealed she had moderate to severe mitral stenosis with moderate aortic stenosis and mild to moderate aortic insufficiency. EF 35-40%. TCTS recommended tissue AVR and MVR which was done on 07/18/2013.   Post-operatively she has developed atrial flutter / atrial fibrillation and is currently on amiodarone and diltiazem with persistently elevated rates. EP has been asked to see for recommendations regarding rhythm management.   Past Medical History Past Medical History  Diagnosis Date  . Rheumatic heart disease     a. 07/2013 Echo: Ef 55-60%, Mod AS/AI, Mod-Sev MS, sev dil LA, PASP 58;  b. 07/2013 TEE: EF 35-40%, mild-mod AS/AI, mod-sev MS, LA smoke;  c. 07/2013 Cath: elev R heart pressures, nl cors.  . Gastroesophageal reflux disease   . Hyperlipidemia   . Cardioembolic stroke 01/2012    Right frontal in 01/2012; normal carotid ultrasound; possible LAA thrombus by TEE; virtual complete neurologic  recovery  . Fasting hyperglycemia     120 fasting  . Chronic kidney disease, stage 2, mildly decreased GFR     GFR of approximately 60  . Breast carcinoma 1995    1995  . Depression   . Diverticulosis of colon (without mention of hemorrhage) 2012    Dr. Karilyn Cota  . Gastroparesis   . Hemorrhoids   . Anemia   . Shortness of breath   . Hypertension     pt denies 05/30/13     Dr Hinton Rao    Past Surgical History Past Surgical History  Procedure Laterality Date  . Mitral valve surgery  [facility], closed mitral valvulotomy by finger fracture  . Cardiac catheterization    . Dilation and curettage of uterus    . Breast lumpectomy Right 1995  . Tubal ligation  1973  . Tee without cardioversion  01/24/2012    Procedure: TRANSESOPHAGEAL ECHOCARDIOGRAM (TEE);  Surgeon: Lewayne Bunting, MD;  Location: [facility] ENDOSCOPY;  Service: Cardiovascular;  Laterality: N/A;  . Colonoscopy  2012    Negative screening procedure  . Esophageal manometry N/A 06/17/2013    Procedure: ESOPHAGEAL MANOMETRY (EM);  Surgeon: Mardella Layman, MD;  Location: [facility] ENDOSCOPY;  Service: Endoscopy;  Laterality: N/A;  . Tee without cardioversion N/A 07/11/2013    Procedure: TRANSESOPHAGEAL ECHOCARDIOGRAM (TEE);  Surgeon: Vesta Mixer, MD;  Location: [facility] ENDOSCOPY;  Service: Cardiovascular;  Laterality: N/A;  . Aortic valve replacement N/A 07/18/2013    Procedure: AORTIC VALVE REPLACEMENT (AVR);  Surgeon: [name], MD;  Location: [facility] OR;  Service: Open Heart Surgery;  Laterality: N/A;  . Intraoperative transesophageal echocardiogram  N/A 07/18/2013    Procedure: INTRAOPERATIVE TRANSESOPHAGEAL ECHOCARDIOGRAM;  Surgeon: [name], MD;  Location: [facility] OR;  Service: Open Heart Surgery;  Laterality: N/A;  . Mitral valve replacement N/A 07/18/2013    Procedure: MITRAL VALVE (MV) REPLACEMENT;  Surgeon: [name], MD;  Location: [facility] OR;  Service: Open Heart Surgery;  Laterality: N/A;      Allergies/Intolerances No Known Allergies  Inpatient Medications . amiodarone  400 mg Oral BID  . aspirin EC  81 mg Oral Daily  . atorvastatin  20 mg Oral Daily  . buPROPion  150 mg Oral Daily  . diltiazem  60 mg Oral Q6H  . docusate sodium  200 mg Oral Daily  . famotidine  20 mg Oral BID  . fluticasone  2 puff Inhalation BID  . insulin aspart  0-15 Units Subcutaneous TID WC  . levothyroxine  112 mcg Oral QAC breakfast  . metFORMIN  500 mg Oral BID WC  . multivitamin with minerals  1 tablet Oral Daily  . saccharomyces boulardii  250 mg Oral Daily  . sodium chloride  3 mL Intravenous Q12H  . warfarin  2.5 mg Oral q1800  . Warfarin - Physician Dosing Inpatient   Does not apply q60    Family History Family History  Problem Relation Age of Onset  . Heart disease Brother 50    MI  . Rheum arthritis Maternal Grandmother   . Asthma Maternal Grandfather   . Hypothyroidism Mother   . Diabetes Sister   . Cirrhosis Father   . Cervical cancer Sister     malignant neoplasm of cervix uteri     Social History Social History  . Marital Status: Married   Social History Main Topics  . Smoking status: Never Smoker   . Smokeless tobacco: Never Used  . Alcohol Use: No  . Drug Use: No   Review of Systems General: No chills, fever, night sweats or weight changes  Cardiovascular:  No chest pain, dyspnea on exertion, edema, orthopnea, palpitations, paroxysmal nocturnal dyspnea Dermatological: No rash, lesions or masses Respiratory: No cough, dyspnea Urologic: No hematuria, dysuria Abdominal: No nausea, vomiting, diarrhea, bright red blood per rectum, melena, or hematemesis Neurologic: No visual changes, weakness, changes in mental status All other systems reviewed and are otherwise negative except as noted above.  Physical Exam Vitals: Blood pressure 142/74, pulse 133, temperature 98.4 F (36.9 C), temperature source Oral, resp. rate 18, height 5\' 2"  (1.575 m), weight 177 lb  (80.287 kg), SpO2 94.00%.  General: Well developed, well appearing 67 y.o. female in no acute distress. HEENT: Normocephalic, atraumatic. EOMs intact. Sclera nonicteric. Oropharynx clear. Neck: Supple without bruits. No JVD. Lungs: Respirations regular and unlabored. Diminished breath sounds at bases but CTA bilaterally. No wheezes, rales or rhonchi. Heart: Tachycardic. S1, S2 present. No murmurs, rub, S3 or S4. Abdomen: Soft, non-tender, non-distended. BS present x 4 quadrants. No hepatosplenomegaly.  Extremities: No clubbing or cyanosis. 1-2+ peripheral edema bilaterally. DP/PT/Radials 2+ and equal bilaterally. Psych: Normal affect. Neuro: Alert and oriented X 3. Moves all extremities spontaneously. Musculoskeletal: No kyphosis. Skin: Intact. Warm and dry. No rashes or petechiae in exposed areas.   Labs Lab Results  Component Value Date   WBC 12.1* 07/20/2013   HGB 10.5* 07/20/2013   HCT 31.9* 07/20/2013   MCV 87.9 07/20/2013   PLT 107* 07/20/2013    Recent Labs Lab 07/23/13 0535  NA 133*  K 3.7  CL 94*  CO2 30  BUN 10  CREATININE 0.76  CALCIUM 8.6  GLUCOSE 136*    Recent Labs  07/24/13 1439  TSH 6.931*    Recent Labs  07/25/13 0400  INR 2.31*    Radiology/Studies Dg Chest Port 1 View 07/20/2013   IMPRESSION: 1. Mild central vascular congestion without overt pulmonary edema. Lung base opacity consistent with small effusions and atelectasis. This is stable. 2. No mediastinal widening or gross pneumothorax. No evidence of an operative complication. 3. Support apparatus has been removed with the exception of the right internal jugular sheath.    Electronically Signed   By: Amie Portland M.D.   On: 07/20/2013 07:23   Dg Chest 2 View 07/17/2013    IMPRESSION: No active cardiopulmonary disease.    Electronically Signed   By: Roque Lias M.D.   On: 07/17/2013 09:36   Ct Angio Chest Pe W/cm &/or Wo Cm 07/07/2013   IMPRESSION:  1.  No evidence of pulmonary  embolus. 2.  Moderate right-sided pleural effusion and small loculated left- sided pleural effusion; diffuse interstitial prominence likely reflects pulmonary edema.    Original Report Authenticated By: Tonia Ghent, M.D.   Transesophageal echocardiogram 07/11/2013 Study Conclusions - Left ventricle: Systolic function was moderately reduced. The estimated ejection fraction was in the range of 35% to 40%. - Aortic valve: There was mild to moderate stenosis. Mild to moderate regurgitation. - Mitral valve: The findings are consistent with moderate to severe stenosis. Valve area by pressure half-time: 1.45cm^2. - Left atrium: There was spontaneous echo contrast ("smoke"). - Atrial septum: No defect or patent foramen ovale was identified. - Tricuspid valve: No evidence of vegetation.  12-lead ECG pre-op - SR with first degree AV block and prolonged QT 12-lead ECG post-op day 1 - NSR 12-lead ECG 07/23/2013 - atrial fibrillation at 72 bpm  Telemetry shows persistent atrial flutter with rates consistently >120 in last 24-48 hours  Assessment and Plan 1. Atrial flutter / atrial fibrillation with RVR and very difficult to slow down 2. Rheumatic valvular heart disease s/p tissue AVR and MVR this admission 3. LV dysfunction, EF 35-40%, pre-operatively 4. Subtherapeutic INR - until today 5. Prior CVA 6. CKD  Ms. Slavick currently has atrial flutter with poorly controlled rates. She initially had atrial fibrillation by ECG on 07/23/2013. She is now on amiodarone, diltiazem and digoxin. She was tried on metoprolol which was ineffective. In the setting of recent valve surgery, LV dysfunction and subtherapeutic INR (until today), would continue with amiodarone loading. Treatment strategies were reviewed with her in detail regarding possible TEE-guided DCCV in hopes of restoring SR.  Dr. Ladona Ridgel was in to interview and examine the patient. Please see recommendations below. Signed, Rick Duff, PA-C 07/25/2013, 2:52 PM  EP Attending  Patient seen and examined. I have reviewed the findings as noted above and discussed the issues with the patient and her daughter. She has had refractory atrial flutter and fibrillation with a rapid ventricular response. She feels poorly. She is now on multiple rate slowing meds. I have recommended TEE guided DCCV. She may not hold NSR as she has only had 5 doses of amio but she will feel better in NSR. Continue amio. Watch for bradycardia as sometimes this will occur suddenly on all of these AV nodal blocking drugs.  Leonia Reeves.D.

## 2013-07-26 NOTE — Interval H&P Note (Signed)
History and Physical Interval Note:  07/26/2013 1:19 PM  [name]  has presented today for surgery, with the diagnosis of afib  The various methods of treatment have been discussed with the patient and family. After consideration of risks, benefits and other options for treatment, the patient has consented to  Procedure(s): TRANSESOPHAGEAL ECHOCARDIOGRAM (TEE) (N/A) CARDIOVERSION (N/A) as a surgical intervention .  The patient's history has been reviewed, patient examined, no change in status, stable for surgery.  I have reviewed the patient's chart and labs.  Questions were answered to the patient's satisfaction.     Dietrich Pates

## 2013-07-26 NOTE — Op Note (Signed)
TEE Full report to follow LA without masses LA appendage appears ligated with no flow. See CV section for full TEE report.

## 2013-07-26 NOTE — Progress Notes (Signed)
Echocardiogram Echocardiogram Transesophageal has been performed.  07/26/2013 3:59 PM Gertie Fey, RVT, RDCS, RDMS

## 2013-07-26 NOTE — CV Procedure (Signed)
Patient anesthetized with Propofol by anesthesia With pads in AP position patient cardioverted with 200 J synchronized biphasic energy  Unsuccessful. This was repeated with pressure to chest during exhilation.  Again, unsuccessful in converting Pads moved to apex base  Again, 200J synchronized biphasic energy applied.  Unsuccessful.   12 lead EKG pending.

## 2013-07-27 ENCOUNTER — Encounter (HOSPITAL_COMMUNITY): Payer: Self-pay | Admitting: Interventional Cardiology

## 2013-07-27 DIAGNOSIS — E871 Hypo-osmolality and hyponatremia: Secondary | ICD-10-CM | POA: Insufficient documentation

## 2013-07-27 DIAGNOSIS — I359 Nonrheumatic aortic valve disorder, unspecified: Secondary | ICD-10-CM

## 2013-07-27 DIAGNOSIS — I059 Rheumatic mitral valve disease, unspecified: Secondary | ICD-10-CM

## 2013-07-27 DIAGNOSIS — I4891 Unspecified atrial fibrillation: Secondary | ICD-10-CM | POA: Insufficient documentation

## 2013-07-27 LAB — COMPREHENSIVE METABOLIC PANEL
AST: 33 U/L (ref 0–37)
Albumin: 2.4 g/dL — ABNORMAL LOW (ref 3.5–5.2)
Alkaline Phosphatase: 139 U/L — ABNORMAL HIGH (ref 39–117)
BUN: 11 mg/dL (ref 6–23)
CO2: 24 mEq/L (ref 19–32)
Calcium: 8.8 mg/dL (ref 8.4–10.5)
Creatinine, Ser: 0.92 mg/dL (ref 0.50–1.10)
GFR calc non Af Amer: 62 mL/min — ABNORMAL LOW (ref >90)
Total Protein: 6.5 g/dL (ref 6.0–8.3)

## 2013-07-27 LAB — GLUCOSE, CAPILLARY
Glucose-Capillary: 137 mg/dL — ABNORMAL HIGH (ref 70–99)
Glucose-Capillary: 157 mg/dL — ABNORMAL HIGH (ref 70–99)

## 2013-07-27 LAB — CBC
HCT: 30.7 % — ABNORMAL LOW (ref 36.0–46.0)
Hemoglobin: 10 g/dL — ABNORMAL LOW (ref 12.0–15.0)
MCH: 28.5 pg (ref 26.0–34.0)
RBC: 3.51 MIL/uL — ABNORMAL LOW (ref 3.87–5.11)
RDW: 14.8 % (ref 11.5–15.5)

## 2013-07-27 LAB — PROTIME-INR
INR: 2.05 — ABNORMAL HIGH (ref 0.00–1.49)
Prothrombin Time: 22.5 seconds — ABNORMAL HIGH (ref 11.6–15.2)

## 2013-07-27 LAB — DIGOXIN LEVEL: Digoxin Level: 1.8 ng/mL (ref 0.8–2.0)

## 2013-07-27 MED ORDER — FUROSEMIDE 40 MG PO TABS
40.0000 mg | ORAL_TABLET | ORAL | Status: AC
  Administered 2013-07-27: 40 mg via ORAL
  Filled 2013-07-27: qty 1

## 2013-07-27 MED ORDER — METOPROLOL TARTRATE 25 MG PO TABS
25.0000 mg | ORAL_TABLET | Freq: Two times a day (BID) | ORAL | Status: DC
  Administered 2013-07-27 – 2013-08-03 (×15): 25 mg via ORAL
  Filled 2013-07-27 (×17): qty 1

## 2013-07-27 NOTE — Progress Notes (Addendum)
      [address]       [name] [id number]             [phone]      1 Day Post-Op Procedure(s) (LRB): TRANSESOPHAGEAL ECHOCARDIOGRAM (TEE) (N/A) CARDIOVERSION (N/A)  Subjective:  [name] is without complaints this morning.  She feels good and is hoping to be discharged soon.  Objective: Vital signs in last 24 hours: Temp:  [97.6 F (36.4 C)-99.7 F (37.6 C)] 99.7 F (37.6 C) (10/25 0423) Pulse Rate:  [55-104] 104 (10/25 0423) Cardiac Rhythm:  [-] Normal sinus rhythm;Heart block (10/25 0815) Resp:  [12-22] 18 (10/25 0423) BP: (124-185)/(50-143) 149/78 mmHg (10/25 0900) SpO2:  [94 %-100 %] 94 % (10/25 0931) FiO2 (%):  [28 %] 28 % (10/25 0745) Weight:  [181 lb (82.1 kg)] 181 lb (82.1 kg) (10/25 0423)  Intake/Output from previous day: 10/24 0701 - 10/25 0700 In: 253 [I.V.:253] Out: 300 [Urine:300] Intake/Output this shift: Total I/O In: 360 [P.O.:360] Out: 250 [Urine:250]  General appearance: alert, cooperative and no distress Heart: regular rate and rhythm Lungs: clear to auscultation bilaterally Abdomen: soft, non-tender; bowel sounds normal; no masses,  no organomegaly Extremities: edema trace Wound: clean and dry  Lab Results:  Recent Labs  07/27/13 0448  WBC 19.0*  HGB 10.0*  HCT 30.7*  PLT 288   BMET:  Recent Labs  07/27/13 0448  NA 123*  K 5.0  CL 86*  CO2 24  GLUCOSE 119*  BUN 11  CREATININE 0.92  CALCIUM 8.8    PT/INR:  Recent Labs  07/27/13 0448  LABPROT 22.5*  INR 2.05*   ABG    Component Value Date/Time   PHART 7.317* 07/19/2013 0427   HCO3 23.9 07/19/2013 0427   TCO2 24 07/19/2013 1624   ACIDBASEDEF 2.0 07/19/2013 0427   O2SAT 97.0 07/19/2013 0427   CBG (last 3)   Recent Labs  07/26/13 2117 07/27/13 0607 07/27/13 0800  GLUCAP 150* 137* 131*    Assessment/Plan: S/P Procedure(s) (LRB): TRANSESOPHAGEAL ECHOCARDIOGRAM (TEE) (N/A) CARDIOVERSION (N/A)  1. CV- Previous A.Fib, cardioversion  yesterday unsuccessful, however patient NSR this AM- remains tachy, hypertensive- on Amiodarone, Cardizem, Digoxin, will restart home Lopressor dose 2. PT/INR 2.05- will continue 2.5 mg daily 3. Renal- WNL, remains volume overloaded will continue diuresis 4. LOC constipation- Lactulose prn 5. DM- CBGs controlled, continue regimen 6. Dispo- patient stable, NSR this morning, Dig level is Therapeutic- will monitor today if no further rhythm issues potentially home in AM    LOS: 9 days    [name], ERIN 07/27/2013  I have seen and examined the patient and agree with the assessment and plan as outlined.  Kian Ottaviano H 07/27/2013 11:34 AM

## 2013-07-27 NOTE — Progress Notes (Signed)
SUBJECTIVE:  Feels better. Wants to walk this morning.  OBJECTIVE:   Vitals:   Filed Vitals:   07/27/13 0745 07/27/13 0900 07/27/13 0931 07/27/13 1022  BP:  149/78    Pulse:    92  Temp:      TempSrc:      Resp:      Height:      Weight:      SpO2: 96% 96% 94%    I&O's:   Intake/Output Summary (Last 24 hours) at 07/27/13 1033 Last data filed at 07/27/13 0815  Gross per 24 hour  Intake    613 ml  Output    550 ml  Net     63 ml   TELEMETRY: Reviewed telemetry pt in AFib, borderline rate control     PHYSICAL EXAM General: Well developed, well nourished, in no acute distress Head:   Normal cephalic and atramatic  Lungs:   No wheezing Heart: Irregularly irregular,S1 S2   Extremities:  No  edema.  DP +1 Neuro: Alert and oriented X 3. Psych:  Normal affect, responds appropriately   LABS: Basic Metabolic Panel:  Recent Labs  16/10/96 0448  NA 123*  K 5.0  CL 86*  CO2 24  GLUCOSE 119*  BUN 11  CREATININE 0.92  CALCIUM 8.8   Liver Function Tests:  Recent Labs  07/27/13 0448  AST 33  ALT 33  ALKPHOS 139*  BILITOT 0.8  PROT 6.5  ALBUMIN 2.4*   No results found for this basename: LIPASE, AMYLASE,  in the last 72 hours CBC:  Recent Labs  07/27/13 0448  WBC 19.0*  HGB 10.0*  HCT 30.7*  MCV 87.5  PLT 288   Cardiac Enzymes: No results found for this basename: CKTOTAL, CKMB, CKMBINDEX, TROPONINI,  in the last 72 hours BNP: No components found with this basename: POCBNP,  D-Dimer: No results found for this basename: DDIMER,  in the last 72 hours Hemoglobin A1C: No results found for this basename: HGBA1C,  in the last 72 hours Fasting Lipid Panel: No results found for this basename: CHOL, HDL, LDLCALC, TRIG, CHOLHDL, LDLDIRECT,  in the last 72 hours Thyroid Function Tests:  Recent Labs  07/24/13 1439  TSH 6.931*   Anemia Panel: No results found for this basename: VITAMINB12, FOLATE, FERRITIN, TIBC, IRON, RETICCTPCT,  in the last 72  hours Coag Panel:   Lab Results  Component Value Date   INR 2.05* 07/27/2013   INR 2.06* 07/26/2013   INR 2.31* 07/25/2013    RADIOLOGY: Dg Chest 2 View  07/26/2013   CLINICAL DATA:  Shortness of breath  EXAM: CHEST  2 VIEW  COMPARISON:  07/25/2013; 07/20/2013; 07/19/2013; chest CT-07/07/2013  FINDINGS: Grossly unchanged cardiac silhouette and mediastinal contours post median sternotomy and mitral valve replacements. Overall improved aeration of the lungs with persistent mild pulmonary venous congestion. Grossly unchanged small to moderate size partially loculated right-sided pleural effusion. Minimal bibasilar evidence opacities, right greater than left. No new focal airspace opacities. No pneumothorax. Unchanged bones. Right axillary surgical clips.  IMPRESSION: 1. Improved pulmonary edema with persistent pulmonary venous congestion.  2. Grossly unchanged small to moderate size right-sided effusion with associated right basilar atelectasis.   Electronically Signed   By: Simonne Come M.D.   On: 07/26/2013 07:36   Dg Chest 2 View  07/17/2013   CLINICAL DATA:  Aortic valve replacement.  EXAM: CHEST  2 VIEW  COMPARISON:  July 08, 2013.  FINDINGS: Stable mild cardiomegaly. Surgical clips seen in  right axillary region. No acute pulmonary disease is noted. No pleural effusion or pneumothorax is noted. Bony thorax is intact.  IMPRESSION: No active cardiopulmonary disease.   Electronically Signed   By: Roque Lias M.D.   On: 07/17/2013 09:36   Dg Chest 2 View  07/08/2013   CLINICAL DATA:  Pulmonary edema, pleural effusion  EXAM: CHEST  2 VIEW  COMPARISON:  07/07/2013  FINDINGS: Cardiomegaly again noted. Elevation of the right hemidiaphragm. There is small right pleural effusion with right basilar atelectasis or infiltrate. No pulmonary edema. Surgical clips right breast and right axilla again noted. Stable calcified granuloma left base.  IMPRESSION: Elevation of the right hemidiaphragm. There is small  right pleural effusion with right basilar atelectasis or infiltrate. No pulmonary edema. Surgical clips right breast and right axilla again noted. Stable calcified granuloma left base.   Electronically Signed   By: Natasha Mead M.D.   On: 07/08/2013 09:00   Dg Chest 2 View  07/07/2013   CLINICAL DATA:  Shortness of breath and chest pain  EXAM: CHEST  2 VIEW  COMPARISON:  09/17/2012  FINDINGS: Moderate enlargement of the cardiomediastinal silhouette is noted. Center vascular congestion is present with a few Kerley B-lines peripherally and trace effusions. Lung volumes are low, with elevation of the right hemidiaphragm and probable superimposed right greater than left lower lobe atelectasis. Right axillary clips are noted with evidence of right lumpectomy.  IMPRESSION: Moderate cardiomegaly with findings suggestive of interstitial pulmonary edema. Followup after diuresis may be helpful if there is strong clinical suspicion for pneumonia which could be masked.   Electronically Signed   By: Christiana Pellant M.D.   On: 07/07/2013 00:59   Ct Angio Chest Pe W/cm &/or Wo Cm  07/07/2013   *RADIOLOGY REPORT*  Clinical Data: Shortness of breath and chest pain; back pain.  CT ANGIOGRAPHY CHEST  Technique:  Multidetector CT imaging of the chest using the standard protocol during bolus administration of intravenous contrast. Multiplanar reconstructed images including MIPs were obtained and reviewed to evaluate the vascular anatomy.  Contrast: OMNIPAQUE IOHEXOL 350 MG/ML SOLN  Comparison: Chest radiograph performed in the today at 12:22 a.m., and CT of the chest performed 02/19/2009  Findings: There is no evidence of pulmonary embolus.  A moderate right-sided pleural effusion is noted, and a small loculated left-sided pleural effusion is seen.  There is diffuse interstitial prominence, likely reflecting mild pulmonary edema. There is no evidence of pneumothorax.  No masses are identified; no abnormal focal contrast  enhancement is seen.  The mediastinum is unremarkable in appearance.  No mediastinal lymphadenopathy is seen.  Visualized mediastinal nodes remain normal in size.  No pericardial effusion is identified.  The great vessels are grossly unremarkable in appearance.  No axillary lymphadenopathy is seen.  The visualized portions of the thyroid gland are unremarkable in appearance.  The visualized portions of the liver and spleen are unremarkable.  No acute osseous abnormalities are seen.  IMPRESSION:  1.  No evidence of pulmonary embolus. 2.  Moderate right-sided pleural effusion and small loculated left- sided pleural effusion; diffuse interstitial prominence likely reflects pulmonary edema.   Original Report Authenticated By: Tonia Ghent, M.D.   Dg Esophagus  07/05/2013   CLINICAL DATA:  Chest pain. Cough. Dysphagia. Recent endoscopy.  EXAM: ESOPHOGRAM / BARIUM SWALLOW / BARIUM TABLET STUDY  TECHNIQUE: Combined double contrast and single contrast examination performed using effervescent crystals, thick barium liquid, and thin barium liquid. The patient was observed with fluoroscopy swallowing  a 13mm barium sulphate tablet.  COMPARISON:  None.  FLUOROSCOPY TIME:  1 min 28 seconds  FINDINGS: No evidence of esophageal mass or stricture. No radiographic signs of esophagitis. No evidence of hiatal hernia.  Esophageal motility is within normal limits. No gastroesophageal reflux seen during the exam. A 13 mm barium tablet passed freely through the esophagus and into the stomach.  IMPRESSION: Normal study. No radiographic abnormality of the esophagus identified.   Electronically Signed   By: Myles Rosenthal M.D.   On: 07/05/2013 11:58   Dg Chest Port 1 View  07/25/2013   CLINICAL DATA:  Shortness of breath.  EXAM: PORTABLE CHEST - 1 VIEW  COMPARISON:  07/20/2013.  FINDINGS: The heart is enlarged but stable. The right IJ catheter has been removed. Stable surgical changes related to valve replacement surgery. There is  persistent vascular congestion, probable mild interstitial pulmonary edema and right greater than left pleural effusions.  IMPRESSION: Persistent cardiac enlargement, vascular congestion, mild interstitial edema and right greater than left pleural effusions.   Electronically Signed   By: Loralie Champagne M.D.   On: 07/25/2013 20:54   Dg Chest Port 1 View  07/20/2013   CLINICAL DATA:  Status post cardiac surgery.  EXAM: PORTABLE CHEST - 1 VIEW  COMPARISON:  07/19/2013  FINDINGS: Cardiac silhouette is enlarged, but stable. No mediastinal widening.  There is vascular congestion centrally, mild interstitial thickening bilaterally and hazy bilateral lung base opacity, with the latter finding consistent with small effusions with associated atelectasis. There is no gross pneumothorax on this semi-erect study.  All lines and tubes have been removed with the exception of the right internal jugular sheath.  IMPRESSION: 1. Mild central vascular congestion without overt pulmonary edema. Lung base opacity consistent with small effusions and atelectasis. This is stable. 2. No mediastinal widening or gross pneumothorax. No evidence of an operative complication. 3. Support apparatus has been removed with the exception of the right internal jugular sheath.   Electronically Signed   By: Amie Portland M.D.   On: 07/20/2013 07:23   Dg Chest Portable 1 View In Am  07/19/2013   CLINICAL DATA:  Postop.  EXAM: PORTABLE CHEST - 1 VIEW  COMPARISON:  Multiple priors  FINDINGS: Cardiomediastinal silhouette is unchanged. The support apparatus is unchanged. No pneumothorax. Improvement in left pleural effusion. Mild bibasilar opacities, likely atelectasis.  IMPRESSION: 1.  Bibasilar opacities, likely atelectasis.  2.  Improvement in left pleural effusion.   Electronically Signed   By: Jerene Dilling M.D.   On: 07/19/2013 07:43   Dg Chest Portable 1 View  07/18/2013   CLINICAL DATA:  Status post valvular repair  EXAM: PORTABLE  CHEST - 1 VIEW  COMPARISON:  07/17/2013  FINDINGS: Postsurgical changes are now seen. A Swan-Ganz catheter is noted within the right pulmonary artery. An endotracheal tube, nasogastric catheter the and mediastinal drain are seen. An apparent pericardial drain is noted as well. No pneumothorax is seen. A small left-sided pleural effusion is noted capping over the apex. No focal infiltrate is seen although some mild basilar atelectasis is noted.  IMPRESSION: Postsurgical changes as described.  Tubes and lines as described.  Left-sided pleural effusion capping over the apex.  Bibasilar atelectasis.   Electronically Signed   By: Alcide Clever M.D.   On: 07/18/2013 15:32      ASSESSMENT: AFib, LVEF 35-40%, AVR, MVR  PLAN:  Unsuccessful cardioversion yesterday.  COntinue amiodarone load.  COuld consider repeat attempt at cardioversion after patient loaded.  Rate controlled. Coumadin therapeutic, for stroke prevention.  No CHF sx.  Encourage ambulation.  Hyponatremia 123 from 133.  May need fluid restriction. Started with current diet.  Corky Crafts., MD  07/27/2013  10:33 AM

## 2013-07-27 NOTE — Progress Notes (Signed)
Patient had a episode according to daughter "My mom got up to clean her Coke spill up and almost fell, she seems to have partially loss consciousness and I caught her fall, she is weak and pale" . When I came in patient was on the [facility] having a BM, assisted back to bed and checked orthostatic VS, patient A/O, neuro intact, CBG taken and PA paged and made aware. Will continue to monitor for any additional episodes. Patient on bed alarm and will assist patient to [facility] when needed to witness if this happens again. Lajuana Matte, RN

## 2013-07-27 NOTE — Progress Notes (Signed)
CARDIAC REHAB PHASE I   PRE:  Rate/Rhythm: 90 regular  BP:  Supine:  Sitting: 150/77  Standing:    SaO2: 93 2lncc  MODE:  Ambulation: 200  Ft multiple standing rest breaks   POST:  Rate/Rhythm: 102  BP:  Supine:  Sitting: 152/79  Standing:    SaO2:  88-89 RA O2 replaced at 2lncc Pt ambulated x 1 assist with RW.  Pt fatigued easily and was unable to ambulate further.  Pt with great difficulty with steering and often needed redirection to stay in the middle of hallway.  Pt would speed up and become unsteady with near fall.  Pt had difficulty staying with  walker.     Multiple rest breaks needed for overall fatigue and shortness of breath. Pt complained of feeling very short of breath o2 sat checked 88-89%. Pt placed back on O2. Pt rebounded to 92% within 45 seconds.  Rechecked after 5 minutes o2 sat 95 pt with continued SOB.  Primary nurse notified will ask RT to give breathing treatment.  Daughter at bedside and is very concerned about the SOB and the afib.  Multiple questions asked about monitoring afib at home and readiness to be d/c'd. For safety and progression of mobility  Pt would benefit from the use of rolling walker at home. [id number]  Arna Medici

## 2013-07-28 ENCOUNTER — Inpatient Hospital Stay (HOSPITAL_COMMUNITY): Payer: Medicare Other

## 2013-07-28 DIAGNOSIS — Z7901 Long term (current) use of anticoagulants: Secondary | ICD-10-CM

## 2013-07-28 LAB — GLUCOSE, CAPILLARY
Glucose-Capillary: 152 mg/dL — ABNORMAL HIGH (ref 70–99)
Glucose-Capillary: 153 mg/dL — ABNORMAL HIGH (ref 70–99)
Glucose-Capillary: 129 mg/dL — ABNORMAL HIGH (ref 70–99)
Glucose-Capillary: 135 mg/dL — ABNORMAL HIGH (ref 70–99)

## 2013-07-28 LAB — BASIC METABOLIC PANEL
CO2: 25 mEq/L (ref 19–32)
GFR calc Af Amer: 69 mL/min — ABNORMAL LOW (ref >90)
GFR calc non Af Amer: 60 mL/min — ABNORMAL LOW (ref >90)
Potassium: 5.1 mEq/L (ref 3.5–5.1)
Sodium: 126 mEq/L — ABNORMAL LOW (ref 135–145)

## 2013-07-28 LAB — PROTIME-INR: Prothrombin Time: 29.6 seconds — ABNORMAL HIGH (ref 11.6–15.2)

## 2013-07-28 MED ORDER — FUROSEMIDE 10 MG/ML IJ SOLN
40.0000 mg | Freq: Two times a day (BID) | INTRAMUSCULAR | Status: AC
  Administered 2013-07-28 (×2): 40 mg via INTRAVENOUS
  Filled 2013-07-28 (×2): qty 4

## 2013-07-28 MED ORDER — WARFARIN SODIUM 1 MG PO TABS
1.0000 mg | ORAL_TABLET | Freq: Every day | ORAL | Status: DC
  Administered 2013-07-28: 1 mg via ORAL
  Filled 2013-07-28 (×2): qty 1

## 2013-07-28 NOTE — Progress Notes (Signed)
Pt refused to walk at this time due to being too tired. Will try again later. Will continue to monitor.

## 2013-07-28 NOTE — Progress Notes (Signed)
Pt up to bathroom. Pt states she is feeling better. O2 sat 99%. Pt back to bed resting. VSS. Will continue to assess.

## 2013-07-28 NOTE — Progress Notes (Signed)
SUBJECTIVE:  Feels weak.  SHOBlast night.  Daughter is upset that she was taken off of lasix.  OBJECTIVE:   Vitals:   Filed Vitals:   07/28/13 0349 07/28/13 0401 07/28/13 0741 07/28/13 1012  BP: 161/70 130/70  125/68  Pulse: 71   71  Temp: 98.1 F (36.7 C)     TempSrc: Oral     Resp:      Height:      Weight: 182 lb 14.4 oz (82.963 kg)     SpO2: 93%  96% 95%   I&O's:    Intake/Output Summary (Last 24 hours) at 07/28/13 1029 Last data filed at 07/27/13 2200  Gross per 24 hour  Intake    340 ml  Output      0 ml  Net    340 ml   TELEMETRY: Reviewed telemetry pt in AFib, borderline rate control; now NSR     PHYSICAL EXAM General: Well developed, well nourished, in no acute distress Head:   Normal cephalic and atramatic  Lungs:   Bilateral wheezing Heart: Irregularly irregular,S1 S2   Extremities:  No  edema.  DP +1 Neuro: Alert and oriented X 3. Psych:  Normal affect, responds appropriately   LABS: Basic Metabolic Panel:  Recent Labs  40/98/11 0448 07/28/13 0535  NA 123* 126*  K 5.0 5.1  CL 86* 88*  CO2 24 25  GLUCOSE 119* 143*  BUN 11 13  CREATININE 0.92 0.95  CALCIUM 8.8 8.8   Liver Function Tests:  Recent Labs  07/27/13 0448  AST 33  ALT 33  ALKPHOS 139*  BILITOT 0.8  PROT 6.5  ALBUMIN 2.4*   No results found for this basename: LIPASE, AMYLASE,  in the last 72 hours CBC:  Recent Labs  07/27/13 0448  WBC 19.0*  HGB 10.0*  HCT 30.7*  MCV 87.5  PLT 288   Cardiac Enzymes: No results found for this basename: CKTOTAL, CKMB, CKMBINDEX, TROPONINI,  in the last 72 hours BNP: No components found with this basename: POCBNP,  D-Dimer: No results found for this basename: DDIMER,  in the last 72 hours Hemoglobin A1C: No results found for this basename: HGBA1C,  in the last 72 hours Fasting Lipid Panel: No results found for this basename: CHOL, HDL, LDLCALC, TRIG, CHOLHDL, LDLDIRECT,  in the last 72 hours Thyroid Function Tests: No results  found for this basename: TSH, T4TOTAL, FREET3, T3FREE, THYROIDAB,  in the last 72 hours Anemia Panel: No results found for this basename: VITAMINB12, FOLATE, FERRITIN, TIBC, IRON, RETICCTPCT,  in the last 72 hours Coag Panel:   Lab Results  Component Value Date   INR 2.94* 07/28/2013   INR 2.05* 07/27/2013   INR 2.06* 07/26/2013    RADIOLOGY: Dg Chest 2 View  07/26/2013   CLINICAL DATA:  Shortness of breath  EXAM: CHEST  2 VIEW  COMPARISON:  07/25/2013; 07/20/2013; 07/19/2013; chest CT-07/07/2013  FINDINGS: Grossly unchanged cardiac silhouette and mediastinal contours post median sternotomy and mitral valve replacements. Overall improved aeration of the lungs with persistent mild pulmonary venous congestion. Grossly unchanged small to moderate size partially loculated right-sided pleural effusion. Minimal bibasilar evidence opacities, right greater than left. No new focal airspace opacities. No pneumothorax. Unchanged bones. Right axillary surgical clips.  IMPRESSION: 1. Improved pulmonary edema with persistent pulmonary venous congestion.  2. Grossly unchanged small to moderate size right-sided effusion with associated right basilar atelectasis.   Electronically Signed   By: Simonne Come M.D.   On: 07/26/2013 07:36  Dg Chest 2 View  07/17/2013   CLINICAL DATA:  Aortic valve replacement.  EXAM: CHEST  2 VIEW  COMPARISON:  July 08, 2013.  FINDINGS: Stable mild cardiomegaly. Surgical clips seen in right axillary region. No acute pulmonary disease is noted. No pleural effusion or pneumothorax is noted. Bony thorax is intact.  IMPRESSION: No active cardiopulmonary disease.   Electronically Signed   By: Roque Lias M.D.   On: 07/17/2013 09:36   Dg Chest 2 View  07/08/2013   CLINICAL DATA:  Pulmonary edema, pleural effusion  EXAM: CHEST  2 VIEW  COMPARISON:  07/07/2013  FINDINGS: Cardiomegaly again noted. Elevation of the right hemidiaphragm. There is small right pleural effusion with right basilar  atelectasis or infiltrate. No pulmonary edema. Surgical clips right breast and right axilla again noted. Stable calcified granuloma left base.  IMPRESSION: Elevation of the right hemidiaphragm. There is small right pleural effusion with right basilar atelectasis or infiltrate. No pulmonary edema. Surgical clips right breast and right axilla again noted. Stable calcified granuloma left base.   Electronically Signed   By: Natasha Mead M.D.   On: 07/08/2013 09:00   Dg Chest 2 View  07/07/2013   CLINICAL DATA:  Shortness of breath and chest pain  EXAM: CHEST  2 VIEW  COMPARISON:  09/17/2012  FINDINGS: Moderate enlargement of the cardiomediastinal silhouette is noted. Center vascular congestion is present with a few Kerley B-lines peripherally and trace effusions. Lung volumes are low, with elevation of the right hemidiaphragm and probable superimposed right greater than left lower lobe atelectasis. Right axillary clips are noted with evidence of right lumpectomy.  IMPRESSION: Moderate cardiomegaly with findings suggestive of interstitial pulmonary edema. Followup after diuresis may be helpful if there is strong clinical suspicion for pneumonia which could be masked.   Electronically Signed   By: Christiana Pellant M.D.   On: 07/07/2013 00:59   Ct Angio Chest Pe W/cm &/or Wo Cm  07/07/2013   *RADIOLOGY REPORT*  Clinical Data: Shortness of breath and chest pain; back pain.  CT ANGIOGRAPHY CHEST  Technique:  Multidetector CT imaging of the chest using the standard protocol during bolus administration of intravenous contrast. Multiplanar reconstructed images including MIPs were obtained and reviewed to evaluate the vascular anatomy.  Contrast: OMNIPAQUE IOHEXOL 350 MG/ML SOLN  Comparison: Chest radiograph performed in the today at 12:22 a.m., and CT of the chest performed 02/19/2009  Findings: There is no evidence of pulmonary embolus.  A moderate right-sided pleural effusion is noted, and a small loculated  left-sided pleural effusion is seen.  There is diffuse interstitial prominence, likely reflecting mild pulmonary edema. There is no evidence of pneumothorax.  No masses are identified; no abnormal focal contrast enhancement is seen.  The mediastinum is unremarkable in appearance.  No mediastinal lymphadenopathy is seen.  Visualized mediastinal nodes remain normal in size.  No pericardial effusion is identified.  The great vessels are grossly unremarkable in appearance.  No axillary lymphadenopathy is seen.  The visualized portions of the thyroid gland are unremarkable in appearance.  The visualized portions of the liver and spleen are unremarkable.  No acute osseous abnormalities are seen.  IMPRESSION:  1.  No evidence of pulmonary embolus. 2.  Moderate right-sided pleural effusion and small loculated left- sided pleural effusion; diffuse interstitial prominence likely reflects pulmonary edema.   Original Report Authenticated By: Tonia Ghent, M.D.   Dg Esophagus  07/05/2013   CLINICAL DATA:  Chest pain. Cough. Dysphagia. Recent endoscopy.  EXAM:  ESOPHOGRAM / BARIUM SWALLOW / BARIUM TABLET STUDY  TECHNIQUE: Combined double contrast and single contrast examination performed using effervescent crystals, thick barium liquid, and thin barium liquid. The patient was observed with fluoroscopy swallowing a 13mm barium sulphate tablet.  COMPARISON:  None.  FLUOROSCOPY TIME:  1 min 28 seconds  FINDINGS: No evidence of esophageal mass or stricture. No radiographic signs of esophagitis. No evidence of hiatal hernia.  Esophageal motility is within normal limits. No gastroesophageal reflux seen during the exam. A 13 mm barium tablet passed freely through the esophagus and into the stomach.  IMPRESSION: Normal study. No radiographic abnormality of the esophagus identified.   Electronically Signed   By: Myles Rosenthal M.D.   On: 07/05/2013 11:58   Dg Chest Port 1 View  07/25/2013   CLINICAL DATA:  Shortness of breath.  EXAM:  PORTABLE CHEST - 1 VIEW  COMPARISON:  07/20/2013.  FINDINGS: The heart is enlarged but stable. The right IJ catheter has been removed. Stable surgical changes related to valve replacement surgery. There is persistent vascular congestion, probable mild interstitial pulmonary edema and right greater than left pleural effusions.  IMPRESSION: Persistent cardiac enlargement, vascular congestion, mild interstitial edema and right greater than left pleural effusions.   Electronically Signed   By: Loralie Champagne M.D.   On: 07/25/2013 20:54   Dg Chest Port 1 View  07/20/2013   CLINICAL DATA:  Status post cardiac surgery.  EXAM: PORTABLE CHEST - 1 VIEW  COMPARISON:  07/19/2013  FINDINGS: Cardiac silhouette is enlarged, but stable. No mediastinal widening.  There is vascular congestion centrally, mild interstitial thickening bilaterally and hazy bilateral lung base opacity, with the latter finding consistent with small effusions with associated atelectasis. There is no gross pneumothorax on this semi-erect study.  All lines and tubes have been removed with the exception of the right internal jugular sheath.  IMPRESSION: 1. Mild central vascular congestion without overt pulmonary edema. Lung base opacity consistent with small effusions and atelectasis. This is stable. 2. No mediastinal widening or gross pneumothorax. No evidence of an operative complication. 3. Support apparatus has been removed with the exception of the right internal jugular sheath.   Electronically Signed   By: Amie Portland M.D.   On: 07/20/2013 07:23   Dg Chest Portable 1 View In Am  07/19/2013   CLINICAL DATA:  Postop.  EXAM: PORTABLE CHEST - 1 VIEW  COMPARISON:  Multiple priors  FINDINGS: Cardiomediastinal silhouette is unchanged. The support apparatus is unchanged. No pneumothorax. Improvement in left pleural effusion. Mild bibasilar opacities, likely atelectasis.  IMPRESSION: 1.  Bibasilar opacities, likely atelectasis.  2.  Improvement in  left pleural effusion.   Electronically Signed   By: Jerene Dilling M.D.   On: 07/19/2013 07:43   Dg Chest Portable 1 View  07/18/2013   CLINICAL DATA:  Status post valvular repair  EXAM: PORTABLE CHEST - 1 VIEW  COMPARISON:  07/17/2013  FINDINGS: Postsurgical changes are now seen. A Swan-Ganz catheter is noted within the right pulmonary artery. An endotracheal tube, nasogastric catheter the and mediastinal drain are seen. An apparent pericardial drain is noted as well. No pneumothorax is seen. A small left-sided pleural effusion is noted capping over the apex. No focal infiltrate is seen although some mild basilar atelectasis is noted.  IMPRESSION: Postsurgical changes as described.  Tubes and lines as described.  Left-sided pleural effusion capping over the apex.  Bibasilar atelectasis.   Electronically Signed   By: Eulah Pont.D.  On: 07/18/2013 15:32      ASSESSMENT: AFib, LVEF 35-40%, AVR, MVR  PLAN:  Unsuccessful cardioversion on 10/24.  COntinue amiodarone load.  Hopefully will maintain NSR on Amiodarone.   Coumadin therapeutic, for stroke prevention.  Spoke to daughter at length who is a retired Teacher, early years/pre.    Education officer, community.  Hyponatremia 123 from 133.  Fluid restriction Started with current diet yesterday.  Na up to 126 this AM.  Continue fluid restriction.  LIV lasix should help be removing free water.  Agree with IV lasix for fluid overload. Wheezing may be related to pulmonary edema.  Corky Crafts., MD  07/28/2013  10:29 AM

## 2013-07-28 NOTE — Progress Notes (Addendum)
[address]       [place] [address]             [phone]      2 Days Post-Op Procedure(s) (LRB): TRANSESOPHAGEAL ECHOCARDIOGRAM (TEE) (N/A) CARDIOVERSION (N/A)  Subjective:  Ms. Steichen states she feels "weak" today.  Yesterday afternoon/evening per patient's daughter she had a near syncopal episode while standing up to clean up a spilled drink.  They also stated last evening she had an episode of dyspnea where she couldn't catch her breath.  They stated her breathing was very labored and they felt she needed Lasix immediately.  A PO dose was ordered and the family felt it provided relief.  Patient's saturations have remained in the 90s with use of oxygen.  She is ambulating with unsteady gait with cardiac rehab with use of walker, however family insists they will take her vs. SNF placement.  Objective: Vital signs in last 24 hours: Temp:  [98.1 F (36.7 C)-98.3 F (36.8 C)] 98.1 F (36.7 C) (10/26 0349) Pulse Rate:  [71-96] 71 (10/26 0349) Cardiac Rhythm:  [-] Normal sinus rhythm;Heart block (10/26 0830) Resp:  [16] 16 (10/25 1402) BP: (124-161)/(70-81) 130/70 mmHg (10/26 0401) SpO2:  [93 %-99 %] 96 % (10/26 0741) FiO2 (%):  [28 %] 28 % (10/26 0741) Weight:  [182 lb 14.4 oz (82.963 kg)] 182 lb 14.4 oz (82.963 kg) (10/26 0349)  Intake/Output from previous day: 10/25 0701 - 10/26 0700 In: 700 [P.O.:700] Out: 250 [Urine:250]  General appearance: alert, cooperative and no distress Heart: regular rate and rhythm Lungs: diminished breath sounds right base Abdomen: soft, non-tender; bowel sounds normal; no masses,  no organomegaly Extremities: edema 1+ pitting Wound: clean and dry  Lab Results:  Recent Labs  07/27/13 0448  WBC 19.0*  HGB 10.0*  HCT 30.7*  PLT 288   BMET:  Recent Labs  07/27/13 0448 07/28/13 0535  NA 123* 126*  K 5.0 5.1  CL 86* 88*  CO2 24 25  GLUCOSE 119* 143*  BUN 11 13  CREATININE 0.92 0.95  CALCIUM 8.8 8.8      PT/INR:  Recent Labs  07/28/13 0535  LABPROT 29.6*  INR 2.94*   ABG    Component Value Date/Time   PHART 7.317* 07/19/2013 0427   HCO3 23.9 07/19/2013 0427   TCO2 24 07/19/2013 1624   ACIDBASEDEF 2.0 07/19/2013 0427   O2SAT 97.0 07/19/2013 0427   CBG (last 3)   Recent Labs  07/27/13 1605 07/27/13 2042 07/28/13 0617  GLUCAP 143* 122* 152*    Assessment/Plan: S/P Procedure(s) (LRB): TRANSESOPHAGEAL ECHOCARDIOGRAM (TEE) (N/A) CARDIOVERSION (N/A)  1. CV- patient continues to flip in and out of Atrial Fibrillation- currently this morning she was NSR with rate in the 70s- continue Amiodarone, Cardizem, Digoxin, Lopressor 2. Pulm- patient remains on oxygen, sats at rest on room air were 88% this morning, encouraged use of IS, last CXR with right sided pleural effusion, due to episode of dyspnea last night will repeat CXR today 3. INR 2.94- will decrease dose to 1 mg nightly 4. Renal- creatinine is stable, patient remains volume overloaded weight up 15lbs, will give IV Lasix 5. DM- CBGS remain controlled, continue current regimen 6. Dispo- patient looks okay this morning, repeat CXR, IV diurese, decrease Coumadin, continue current care   LOS: 10 days    [name], ERIN 07/28/2013  I have seen and examined the patient and agree with the assessment and plan as outlined.  Mrs Crayton still looks to be volume-overloaded and CXR reveals slight increase size of right pleural effusion and RLL atelectasis.  Will give lasix IV today.  Caitlynne Harbeck H 07/28/2013 10:53 AM

## 2013-07-28 NOTE — Progress Notes (Signed)
Pt ambulated in hallway 250 ft with rolling walker on 2 liters of oxygen. Pt unsteady at times and impulsive. Pt assisted back to bedside chair, family present in room. Will continue to monitor.

## 2013-07-28 NOTE — Progress Notes (Signed)
Pt and family has been c/o patient wheezing and having "trouble breathing" O2 sat has been 96% and above with Vitals stable. Pt family asking about Lasix for the patient and insisting that the patient needs lasix tonight. Pt given PRN breathing treatment at 2115. Pt states didn't help very much. Pt in no apparent distress. Respirations 20, O2 sat 96%. Dr. Cornelius Moras paged and told above. Dr. Cornelius Moras ordred 40mg  PO Lasix now. Will continue to monitor. Viviana Simpler

## 2013-07-29 ENCOUNTER — Other Ambulatory Visit: Payer: Self-pay | Admitting: *Deleted

## 2013-07-29 ENCOUNTER — Encounter (HOSPITAL_COMMUNITY): Payer: Self-pay | Admitting: Internal Medicine

## 2013-07-29 LAB — PROTIME-INR
INR: 3.27 — ABNORMAL HIGH (ref 0.00–1.49)
Prothrombin Time: 32.1 seconds — ABNORMAL HIGH (ref 11.6–15.2)

## 2013-07-29 LAB — GLUCOSE, CAPILLARY
Glucose-Capillary: 138 mg/dL — ABNORMAL HIGH (ref 70–99)
Glucose-Capillary: 131 mg/dL — ABNORMAL HIGH (ref 70–99)
Glucose-Capillary: 105 mg/dL — ABNORMAL HIGH (ref 70–99)

## 2013-07-29 LAB — BASIC METABOLIC PANEL
BUN: 14 mg/dL (ref 6–23)
Chloride: 84 mEq/L — ABNORMAL LOW (ref 96–112)
Creatinine, Ser: 0.79 mg/dL (ref 0.50–1.10)
GFR calc Af Amer: >90 mL/min (ref >90)
GFR calc non Af Amer: 83 mL/min — ABNORMAL LOW (ref >90)
Potassium: 3.7 mEq/L (ref 3.5–5.1)

## 2013-07-29 MED ORDER — PHYTONADIONE 5 MG PO TABS
2.5000 mg | ORAL_TABLET | Freq: Once | ORAL | Status: AC
  Administered 2013-07-29: 2.5 mg via ORAL
  Filled 2013-07-29: qty 1

## 2013-07-29 MED ORDER — POTASSIUM CHLORIDE CRYS ER 20 MEQ PO TBCR
40.0000 meq | EXTENDED_RELEASE_TABLET | Freq: Once | ORAL | Status: AC
  Administered 2013-07-29: 40 meq via ORAL
  Filled 2013-07-29: qty 2

## 2013-07-29 MED ORDER — GUAIFENESIN ER 600 MG PO TB12
600.0000 mg | ORAL_TABLET | Freq: Two times a day (BID) | ORAL | Status: DC
  Administered 2013-07-29 – 2013-08-03 (×11): 600 mg via ORAL
  Filled 2013-07-29 (×13): qty 1

## 2013-07-29 MED ORDER — WARFARIN SODIUM 1 MG PO TABS: 1.0000 mg | ORAL_TABLET | Freq: Every day | ORAL | Status: DC

## 2013-07-29 MED ORDER — FUROSEMIDE 10 MG/ML IJ SOLN
40.0000 mg | Freq: Once | INTRAMUSCULAR | Status: AC
  Administered 2013-07-29: 40 mg via INTRAVENOUS
  Filled 2013-07-29: qty 4

## 2013-07-29 MED ORDER — DILTIAZEM HCL ER COATED BEADS 180 MG PO CP24
180.0000 mg | ORAL_CAPSULE | Freq: Every day | ORAL | Status: DC
  Administered 2013-07-29 – 2013-08-03 (×6): 180 mg via ORAL
  Filled 2013-07-29 (×6): qty 1

## 2013-07-29 MED ORDER — SODIUM CHLORIDE 1 G PO TABS
1.0000 g | ORAL_TABLET | Freq: Two times a day (BID) | ORAL | Status: AC
  Administered 2013-07-29 (×2): 1 g via ORAL
  Filled 2013-07-29 (×2): qty 1

## 2013-07-29 MED ORDER — HYDROCOD POLST-CHLORPHEN POLST 10-8 MG/5ML PO LQCR
5.0000 mL | Freq: Two times a day (BID) | ORAL | Status: DC | PRN
  Administered 2013-07-29 – 2013-08-02 (×6): 5 mL via ORAL
  Filled 2013-07-29 (×7): qty 5

## 2013-07-29 NOTE — Progress Notes (Signed)
CARDIAC REHAB PHASE I   PRE:  Rate/Rhythm: 78 SR  BP:  Supine:   Sitting: 130/60  Standing:    SaO2: 98 2L  MODE:  Ambulation: 550 ft   POST:  Rate/Rhythm: 92  BP:  Supine:   Sitting: 140/62  Standing:    SaO2: 90 2L 0945-1025 On arrival pt in recliner. Assisted X  1 used walker and O2 2L to ambulate. Pt started out doing well, then she got tired walked fast. I was unable to get her to slow her pace ,she was leaning forward walking and having trouble steering walker. I got her to take two standing rest stops, but as she went farther she became more unsteady and disoriented. I got help and we were able to get back to room. Pt DOE, sat on 2L 90%. Pt to [facility] then to recliner. Husband in room with her and call light in reach. Reported to pt's nurse.  Melina Copa RN 07/29/2013 10:24 AM

## 2013-07-29 NOTE — Progress Notes (Signed)
Subjective: Breathing  OK  Coughing (nonprod) last night  A chronic problem  Did not sleep well.  No CP Objective: Filed Vitals:   07/28/13 1934 07/28/13 1939 07/29/13 0253 07/29/13 0608  BP: 134/73  123/78 152/72  Pulse: 75   70  Temp: 98.2 F (36.8 C)  98.3 F (36.8 C) 98 F (36.7 C)  TempSrc: Oral  Oral Oral  Resp: 18   20  Height:      Weight:    178 lb 8 oz (80.967 kg)  SpO2: 96% 94% 92% 100%   Weight change: -4 lb 6.4 oz (-1.996 kg)  Intake/Output Summary (Last 24 hours) at 07/29/13 0736 Last data filed at 07/28/13 1921  Gross per 24 hour  Intake      0 ml  Output   1600 ml  Net  -1600 ml    General: Alert, awake, oriented x3, in no acute distress Neck:  JVP is normal Heart: Regular rate and rhythm, without murmurs, rubs, gallops.  Lungs: Clear to auscultation.  No rales or wheezes. Exemities:  No edema.   Neuro: Grossly intact, nonfocal.  Tel:  SR Lab Results: Results for orders placed during the hospital encounter of 07/18/13 (from the past 24 hour(s))  GLUCOSE, CAPILLARY     Status: Abnormal   Collection Time    07/28/13 11:04 AM      Result Value Range   Glucose-Capillary 153 (*) 70 - 99 mg/dL   Comment 1 Notify RN     Comment 2 Documented in Chart    GLUCOSE, CAPILLARY     Status: Abnormal   Collection Time    07/28/13  4:09 PM      Result Value Range   Glucose-Capillary 129 (*) 70 - 99 mg/dL   Comment 1 Notify RN     Comment 2 Documented in Chart    GLUCOSE, CAPILLARY     Status: Abnormal   Collection Time    07/28/13  9:44 PM      Result Value Range   Glucose-Capillary 135 (*) 70 - 99 mg/dL  PROTIME-INR     Status: Abnormal   Collection Time    07/29/13  4:08 AM      Result Value Range   Prothrombin Time 32.1 (*) 11.6 - 15.2 seconds   INR 3.27 (*) 0.00 - 1.49  GLUCOSE, CAPILLARY     Status: Abnormal   Collection Time    07/29/13  6:06 AM      Result Value Range   Glucose-Capillary 138 (*) 70 - 99 mg/dL     Studies/Results: @RISRSLT24 @  Medications:Reviewed   @PROBHOSP @  1.  Atrial fibrillation  Converted to SR yesterday.  Keep on 400 bid amio for now  Continue lopressor.  Switch dilt to 180 qd CD  May be able to d/c  Stop dig. Continue coumadin.  2.  F/E/N  Will check BMET.  Received lasix today x1  Volume looks better  Na pending.  Prob switch to po  Defer to primary service.   LOS: 11 days   Dietrich Pates 07/29/2013, 7:36 AM

## 2013-07-29 NOTE — Progress Notes (Addendum)
[address]       [name] [address]             [phone]        3 Days Post-Op Procedure(s) (LRB): TRANSESOPHAGEAL ECHOCARDIOGRAM (TEE) (N/A) CARDIOVERSION (N/A)  Subjective: Patient coughed a lot last night and did not sleep well. Her cough is sometimes productive.  Objective: Vital signs in last 24 hours: Temp:  [98 F (36.7 C)-98.6 F (37 C)] 98 F (36.7 C) (10/27 9147) Pulse Rate:  [67-75] 70 (10/27 8295) Cardiac Rhythm:  [-] Normal sinus rhythm;Heart block (10/26 2030) Resp:  [18-20] 20 (10/27 0608) BP: (123-152)/(64-80) 152/72 mmHg (10/27 0608) SpO2:  [92 %-100 %] 100 % (10/27 0608) FiO2 (%):  [28 %] 28 % (10/26 0741) Weight:  [80.967 kg (178 lb 8 oz)] 80.967 kg (178 lb 8 oz) (10/27 6213)  Pre op weight 76 kg Current Weight  07/29/13 80.967 kg (178 lb 8 oz)      Intake/Output from previous day: 10/26 0701 - 10/27 0700 In: -  Out: 1600 [Urine:1600]   Physical Exam:  Cardiovascular: RRR Pulmonary: Diminished at bases; no rales, wheezes, or rhonchi. Abdomen: Soft, non tender, bowel sounds present. Extremities: Mild bilateral lower extremity edema. Wounds: Clean and dry.  No erythema or signs of infection.  Lab Results: CBC:  Recent Labs  07/27/13 0448  WBC 19.0*  HGB 10.0*  HCT 30.7*  PLT 288   BMET:   Recent Labs  07/27/13 0448 07/28/13 0535  NA 123* 126*  K 5.0 5.1  CL 86* 88*  CO2 24 25  GLUCOSE 119* 143*  BUN 11 13  CREATININE 0.92 0.95  CALCIUM 8.8 8.8    PT/INR:  Lab Results  Component Value Date   INR 3.27* 07/29/2013   INR 2.94* 07/28/2013   INR 2.05* 07/27/2013   ABG:  INR: Will add last result for INR, ABG once components are confirmed Will add last 4 CBG results once components are confirmed  Assessment/Plan:  1. CV - s/p unsuccessful cardioversion 10/24.She has mostly been A fl/fib with CVR. SR this am.On Amiodarone 400 bid, Lopressor 25 bid, Cardizem 60 qid, Digoxin 0.125 daily, and  Coumadin. INR increased from 2.94 to 3.27. No coumadin for tonight.  2.  Pulmonary - Weaned to room air. Encourage incentive spirometer and flutter valve. 3. Volume Overload - Will give Lasix IV again today 4.  Acute blood loss anemia - Last H and H 10.5 and 31.9 6.DM-CBGs 129/135/138. Pre op HGA1C 6.6. Continue on Metformin 500 bid. She will need follow up with medical doctor as outpatient. 7.Mucinex for cough 8.Continue CRPI   ZIMMERMAN,DONIELLE MPA-C 07/29/2013,7:36 AM     Chart reviewed, patient examined, agree with above. Her rhythm is regular but I don't see good P-waves. Rate is in the 70's. Her main problem seems to be coughing related to the moderate right pleural effusion and RLL atelectasis. This effusion has been getting larger on CXR. I think a thoracentesis is needed but her INR is up to 3.27 despite no coumadin last night. I think she is probably vitamin K deficient so I will give her 2.5 mg po to partially correct her and plan to do thoracentesis tomorrow.  Her sodium is also going down and is down to 121 this am. She received some IV lasix this am so it may go lower. This could cause some mental status changes although I think her sleepiness today is from not sleeping  overnight due to coughing. I will give her some oral salt tablets today. Hold off on further diuresis. I discussed all of this with her husband.

## 2013-07-29 NOTE — Evaluation (Signed)
Physical Therapy Evaluation Patient Details Name: [name] MRN: [id number] DOB: 1946-08-09 Today's Date: 07/29/2013 Time: 9562-1308 PT Time Calculation (min): 28 min  PT Assessment / Plan / Recommendation History of Present Illness   07/10/2013 with a 3-week history of worsening DOE and lower extremity swelling. She was experiencing marked SOB with walking on level ground. On admission, chest CT was negative for pulmonary embolism but confirmed pulmonary edema. She improved with diuresis. She underwent cardiac catheterization and TEE. Cardiac cath showed normal coronary arteries. TEE revealed she had moderate to severe mitral stenosis with moderate aortic stenosis and mild to moderate aortic insufficiency. EF 35-40%. TCTS recommended tissue AVR and MVR which was done on 07/18/2013.  Pt also with cardioversion on 10/24 which was unsuccessful.  Re-attempt successful on 10/26.  Planned thoracentesis on 10/28 secondary to increased pulmonary edema.      Clinical Impression  Patient is s/p above surgery resulting in functional limitations due to the deficits listed below (see PT Problem List). Pt with significantly impaired balance as she fatigues (and difficult for pt to correct herself due to confusion--? Will improve as Na+ improves).  Patient will benefit from skilled PT to increase their independence and safety with mobility to allow discharge to the venue listed below.        PT Assessment  Patient needs continued PT services    Follow Up Recommendations  Home health PT;Supervision/Assistance - 24 hour    Does the patient have the potential to tolerate intense rehabilitation      Barriers to Discharge        Equipment Recommendations  Rolling walker with 5" wheels    Recommendations for Other Services OT consult   Frequency Min 3X/week    Precautions / Restrictions Precautions Precautions: Sternal;Fall   Pertinent Vitals/Pain SaO2 97% on RA; decr to 90% with ambulation (pt  walking quickly, max cues to slow down and required continuous demonstration of pursed lip breathing for pt to continue technique)      Mobility  Bed Mobility Bed Mobility: Not assessed Transfers Transfers: Sit to Stand;Stand to Sit Sit to Stand: 4: Min assist Stand to Sit: 3: Mod assist Details for Transfer Assistance: educated on getting out to front edge of chair, getting her feet behind her knees, and leaning forward over her BOS to incr independence with sit to stand. Pt repeated sit to stand x 3 with min assist; after walk, pt required mod assist to return to sitting due to "dizzy spell" Ambulation/Gait Ambulation/Gait Assistance: 4: Min assist Ambulation Distance (Feet): 160 Feet Assistive device: Rolling walker Ambulation/Gait Assistance Details: pt educated to keep the RW closer to her body with upright posture. Required cues throughout to slow down her pace. Ultimately, pt became mildly confused, began to walk too quickly (and would not follow commands to slow down), and nearly fell forward x 4 as approaching the chair   to sit in.  Gait Pattern: Step-through pattern;Decreased stride length;Shuffle    Exercises General Exercises - Lower Extremity Ankle Circles/Pumps: AROM;Both;15 reps;Seated Toe Raises: Both;10 reps;Standing;AAROM Heel Raises: AAROM;Both;10 reps;Standing   PT Diagnosis: Difficulty walking  PT Problem List: Decreased strength;Decreased activity tolerance;Decreased balance;Decreased mobility;Decreased cognition;Decreased knowledge of use of DME;Decreased safety awareness;Decreased knowledge of precautions;Cardiopulmonary status limiting activity PT Treatment Interventions: DME instruction;Gait training;Stair training;Functional mobility training;Therapeutic activities;Therapeutic exercise;Balance training;Cognitive remediation;Patient/family education     PT Goals(Current goals can be found in the care plan section) Acute Rehab PT Goals Patient Stated Goal:  Agrees she wants to get  better on her feet PT Goal Formulation: With patient Time For Goal Achievement: 08/05/13 Potential to Achieve Goals: Good  Visit Information  Last PT Received On: 07/29/13 Assistance Needed: +1 History of Present Illness:  07/10/2013 with a 3-week history of worsening DOE and lower extremity swelling. She was experiencing marked SOB with walking on level ground. On admission, chest CT was negative for pulmonary embolism but confirmed pulmonary edema. She improved with diuresis. She underwent cardiac catheterization and TEE. Cardiac cath showed normal coronary arteries. TEE revealed she had moderate to severe mitral stenosis with moderate aortic stenosis and mild to moderate aortic insufficiency. EF 35-40%. TCTS recommended tissue AVR and MVR which was done on 07/18/2013.  Pt also with cardioversion on 10/24 which was unsuccessful.  Re-attempt successful on 10/26.  Planned throacentesis on 10/28 secondary to increased pulmonary edema.          Prior Functioning  Home Living Family/patient expects to be discharged to:: Private residence Living Arrangements: Spouse/significant other Available Help at Discharge: Family;Available 24 hours/day Type of Home: House Home Access: Stairs to enter [employer] of Steps: 4 Entrance Stairs-Rails: Right;Left Home Layout: Two level;Able to live on main level with bedroom/bathroom (full bath and 1/2 bath main level) Home Equipment: None Prior Function Level of Independence: Independent Comments: denied balance problems PTA Communication Communication: Other (comment) (?HOH vs slow processing)    Cognition  Cognition Arousal/Alertness: Awake/alert Behavior During Therapy: WFL for tasks assessed/performed Overall Cognitive Status: Impaired/Different from baseline Area of Impairment: Safety/judgement;Awareness;Problem solving Safety/Judgement: Decreased awareness of safety;Decreased awareness of deficits Problem  Solving: Slow processing;Decreased initiation;Requires verbal cues;Requires tactile cues General Comments: pt walking very quickly and required repeated cues to slow down; after 150 ft and entering pt's room, she became anxious, ran RW into the end of the bed with difficulty correcting it's placement, approached her chair "straight on" with RW in front of her with significant loss of balance forwards (tipping RW forward and requiring mod assist to right herself)    Extremity/Trunk Assessment Upper Extremity Assessment Upper Extremity Assessment: Defer to OT evaluation Lower Extremity Assessment Lower Extremity Assessment: Generalized weakness Cervical / Trunk Assessment Cervical / Trunk Assessment: Normal   Balance Balance Balance Assessed: Yes Static Standing Balance Static Standing - Balance Support: No upper extremity supported Static Standing - Level of Assistance: 5: Stand by assistance;3: Mod assist Dynamic Standing Balance Dynamic Standing - Balance Support: Bilateral upper extremity supported Dynamic Standing - Level of Assistance: 4: Min assist Dynamic Standing - Balance Activities: Other (comment) (toe raises, heel raises, step forward/backward) High Level Balance High Level Balance Activites: Head turns  End of Session PT - End of Session Equipment Utilized During Treatment: Gait belt Activity Tolerance: Patient limited by fatigue Patient left: in chair;with call bell/phone within reach  GP     Dailyn Kempner 07/29/2013, 5:20 PM Pager [phone]

## 2013-07-29 NOTE — Progress Notes (Signed)
Patient complaint of wheezing and sob. Patient given breathing treatment but says little relief achieved. Vitals taken and all within normal limits. Encouraged spirometer, deep breathing and cough. Will continue to monitor.

## 2013-07-29 NOTE — Evaluation (Signed)
Occupational Therapy Evaluation Patient Details Name: [name] MRN: [id number] DOB: August 22, 1946 Today's Date: 07/29/2013 Time: 4098-1191 OT Time Calculation (min): 37 min  OT Assessment / Plan / Recommendation History of present illness  07/10/2013 with a 3-week history of worsening DOE and lower extremity swelling. She was experiencing marked SOB with walking on level ground. On admission, chest CT was negative for pulmonary embolism but confirmed pulmonary edema. She improved with diuresis. She underwent cardiac catheterization and TEE. Cardiac cath showed normal coronary arteries. TEE revealed she had moderate to severe mitral stenosis with moderate aortic stenosis and mild to moderate aortic insufficiency. EF 35-40%. TCTS recommended tissue AVR and MVR which was done on 07/18/2013.  Pt also with cardioversion on 10/24 which was unsuccessful.  Re-attempt successful on 10/26.  Planned throacentesis on 10/28 secondary to increased pulmonary edema.      Clinical Impression   Pt currently presents at a min guard assist level for most selfcare tasks.  She does demonstrate some dynamic balance deficits as well as endurance deficits with selfcare tasks.  Recommend continued acute care OT to help increase overall independence to a mod I level for home.  Don't anticipate any HHOT needs or OT related DME at this time.  Will continue to follow for acute care needs.    OT Assessment  Patient needs continued OT Services    Follow Up Recommendations  No OT follow up       Equipment Recommendations  None recommended by OT       Frequency  Min 2X/week    Precautions / Restrictions Precautions Precautions: Sternal;Fall Restrictions Weight Bearing Restrictions: No   Pertinent Vitals/Pain O2 sats 97% at rest on 2Ls, decreased to 87 % at rest on room air    ADL  Eating/Feeding: Simulated;Independent Where Assessed - Eating/Feeding: Chair Grooming: Performed;Supervision/safety;Wash/dry  hands Where Assessed - Grooming: Unsupported standing Upper Body Bathing: Simulated;Set up Where Assessed - Upper Body Bathing: Unsupported sitting Lower Body Bathing: Simulated;Min guard Where Assessed - Lower Body Bathing: Supported sit to stand Upper Body Dressing: Simulated;Set up Where Assessed - Upper Body Dressing: Unsupported sitting Lower Body Dressing: Min guard Where Assessed - Lower Body Dressing: Supported sit to stand Toilet Transfer: Performed;Min guard Statistician Method: Other (comment) (ambulate without assistive device) Acupuncturist: Regular height toilet Toileting - Clothing Manipulation and Hygiene: Performed;Minimal assistance Where Assessed - Engineer, mining and Hygiene: Sit to stand from 3-in-1 or toilet Tub/Shower Transfer: Simulated;Minimal assistance Tub/Shower Transfer Method: Ambulating Equipment Used: Gait belt Transfers/Ambulation Related to ADLs: Pt overall min guard assist for mobility without use of assistive device.  Takes short steps and and maintains a wide base of support..    OT Diagnosis: Generalized weakness;Altered mental status  OT Problem List: Decreased strength;Impaired balance (sitting and/or standing);Decreased activity tolerance;Decreased cognition;Decreased knowledge of use of DME or AE;Pain OT Treatment Interventions: Self-care/ADL training;Energy conservation;DME and/or AE instruction;Patient/family education;Balance training;Therapeutic activities   OT Goals(Current goals can be found in the care plan section) Acute Rehab OT Goals Patient Stated Goal: Pt did not state this session but did report being independent before this. OT Goal Formulation: With patient Time For Goal Achievement: 08/12/13 Potential to Achieve Goals: Good  Visit Information  Last OT Received On: 07/29/13 Assistance Needed: +1 History of Present Illness:  07/10/2013 with a 3-week history of worsening DOE and lower extremity  swelling. She was experiencing marked SOB with walking on level ground. On admission, chest CT was negative for pulmonary embolism but  confirmed pulmonary edema. She improved with diuresis. She underwent cardiac catheterization and TEE. Cardiac cath showed normal coronary arteries. TEE revealed she had moderate to severe mitral stenosis with moderate aortic stenosis and mild to moderate aortic insufficiency. EF 35-40%. TCTS recommended tissue AVR and MVR which was done on 07/18/2013.  Pt also with cardioversion on 10/24 which was unsuccessful.  Re-attempt successful on 10/26.  Planned throacentesis on 10/28 secondary to increased pulmonary edema.          Prior Functioning     Home Living Family/patient expects to be discharged to:: Private residence Living Arrangements: Spouse/significant other Available Help at Discharge: Family Type of Home: House Home Access: Stairs to enter [employer] of Steps: 4 Entrance Stairs-Rails: Right Home Layout: Two level;Able to live on main level with bedroom/bathroom;1/2 bath on main level Prior Function Level of Independence: Independent Dominant Hand: Right         Vision/Perception Vision - History Baseline Vision: Wears glasses all the time Patient Visual Report: No change from baseline Vision - Assessment Eye Alignment: Within Functional Limits Vision Assessment: Vision not tested Perception Perception: Within Functional Limits Praxis Praxis: Intact   Cognition  Cognition Arousal/Alertness: Lethargic Behavior During Therapy: WFL for tasks assessed/performed Overall Cognitive Status: Impaired/Different from baseline Area of Impairment: Memory General Comments: Pt's husband reports history of memory impairment since CVA in 2013 but now may be slightly worse at times.  Pt only able to state 1/3 sternal precautions    Extremity/Trunk Assessment Upper Extremity Assessment Upper Extremity Assessment: Generalized weakness (grip  strength overall 3+/5 did not test further secondary to precautions) Lower Extremity Assessment Lower Extremity Assessment: Defer to PT evaluation Cervical / Trunk Assessment Cervical / Trunk Assessment: Normal     Mobility Transfers Transfers: Sit to Stand;Stand to Sit Sit to Stand: 4: Min guard;Without upper extremity assist;From toilet Stand to Sit: 4: Min guard;Without upper extremity assist;To chair/3-in-1 Details for Transfer Assistance: Min instructional cueing to follow sternal precautions and not use her UEs for sit to stand.        Balance Balance Balance Assessed: Yes Static Standing Balance Static Standing - Balance Support: No upper extremity supported Static Standing - Level of Assistance: 5: Stand by assistance Dynamic Standing Balance Dynamic Standing - Balance Support: No upper extremity supported Dynamic Standing - Level of Assistance: 4: Min assist High Level Balance High Level Balance Activites: Head turns High Level Balance Comments: Pt with LOB when standing with feet together and vision compromised.   End of Session OT - End of Session Equipment Utilized During Treatment: Gait belt Activity Tolerance: Patient limited by fatigue Patient left: in chair;with family/visitor present Nurse Communication: Mobility status     Leandria Thier OTR/L 07/29/2013, 11:24 AM

## 2013-07-29 NOTE — Progress Notes (Signed)
Patient ambulated with 2 liters of oxygen and RW around the circle once. Tolerated walk well, required no rest periods but stated she was tired during the walk. Patient returned to room and chair with call bell in reach and husband at her side, in no distress, placed on 2 liters of oxygen, sats are in the mid 80s on room air. Will continue to monitor.

## 2013-07-30 ENCOUNTER — Inpatient Hospital Stay (HOSPITAL_COMMUNITY): Payer: Medicare Other

## 2013-07-30 DIAGNOSIS — J90 Pleural effusion, not elsewhere classified: Secondary | ICD-10-CM

## 2013-07-30 LAB — GLUCOSE, CAPILLARY
Glucose-Capillary: 131 mg/dL — ABNORMAL HIGH (ref 70–99)
Glucose-Capillary: 111 mg/dL — ABNORMAL HIGH (ref 70–99)
Glucose-Capillary: 139 mg/dL — ABNORMAL HIGH (ref 70–99)

## 2013-07-30 LAB — BASIC METABOLIC PANEL
BUN: 11 mg/dL (ref 6–23)
Calcium: 8.5 mg/dL (ref 8.4–10.5)
Chloride: 87 mEq/L — ABNORMAL LOW (ref 96–112)
GFR calc Af Amer: 85 mL/min — ABNORMAL LOW (ref >90)
GFR calc non Af Amer: 74 mL/min — ABNORMAL LOW (ref >90)
Glucose, Bld: 100 mg/dL — ABNORMAL HIGH (ref 70–99)
Potassium: 4 mEq/L (ref 3.5–5.1)
Sodium: 127 mEq/L — ABNORMAL LOW (ref 135–145)

## 2013-07-30 LAB — PROTIME-INR: Prothrombin Time: 22.2 seconds — ABNORMAL HIGH (ref 11.6–15.2)

## 2013-07-30 MED ORDER — WARFARIN SODIUM 2.5 MG PO TABS
2.5000 mg | ORAL_TABLET | Freq: Once | ORAL | Status: AC
  Administered 2013-07-30: 2.5 mg via ORAL
  Filled 2013-07-30 (×2): qty 1

## 2013-07-30 MED ORDER — LIDOCAINE-EPINEPHRINE 1 %-1:100000 IJ SOLN
20.0000 mL | Freq: Once | INTRAMUSCULAR | Status: AC
  Administered 2013-07-30: 20 mL
  Filled 2013-07-30: qty 20

## 2013-07-30 MED ORDER — FUROSEMIDE 80 MG PO TABS
80.0000 mg | ORAL_TABLET | Freq: Every day | ORAL | Status: DC
  Administered 2013-07-30 – 2013-08-03 (×5): 80 mg via ORAL
  Filled 2013-07-30 (×6): qty 1

## 2013-07-30 MED ORDER — POTASSIUM CHLORIDE CRYS ER 20 MEQ PO TBCR
40.0000 meq | EXTENDED_RELEASE_TABLET | Freq: Every day | ORAL | Status: DC
  Administered 2013-07-30 – 2013-08-03 (×5): 40 meq via ORAL
  Filled 2013-07-30 (×5): qty 2

## 2013-07-30 MED ORDER — GLUCERNA SHAKE PO LIQD
237.0000 mL | Freq: Every day | ORAL | Status: DC
  Filled 2013-07-30 (×2): qty 237

## 2013-07-30 NOTE — Progress Notes (Signed)
INITIAL NUTRITION ASSESSMENT  DOCUMENTATION CODES Per approved criteria  -Obesity Unspecified    INTERVENTION:  Glucerna Shake daily (220 kcals, 10 gm protein per 8 fl oz bottle) RD to follow for nutrition care plan  NUTRITION DIAGNOSIS: Inadequate oral intake related to decreased appetite as evidenced by patient report  Goal: Pt to meet >/= 90% of their estimated nutrition needs   Monitor:  PO & supplemental intake, weight, labs, I/O's  Reason for Assessment: Malnutrition Screening Tool Report  67 y.o. female  Admitting Dx: mitral & aortic stenosis   ASSESSMENT: Patient with PMH of rheumatic valve disease; presented with  three-week history of worsening shortness of breath with exertion and lower extremity edema; in ER found to have pulmonary edema; s/p cardiac cath and TEE.   Patient reports has been poor since she was hospitalized; PO intake mostly 50% per flowsheet records; per weight readings, patient's weight has been fluctuating (more than likely given fluid) -- s/p thoracentesis this AM; amenable to Glucerna Shake daily to maximize protein intake -- RD to order.  RD provided Carbohydrate Counting for People with Diabetes" handouts per patient's husband request.  Height: Ht Readings from Last 1 Encounters:  07/22/13 5\' 2"  (1.575 m)    Weight: Wt Readings from Last 1 Encounters:  07/30/13 179 lb 10.8 oz (81.5 kg)    Ideal Body Weight: 110 lb  % Ideal Body Weight: 162%  Wt Readings from Last 10 Encounters:  07/30/13 179 lb 10.8 oz (81.5 kg)  07/30/13 179 lb 10.8 oz (81.5 kg)  07/30/13 179 lb 10.8 oz (81.5 kg)  07/17/13 167 lb 8 oz (75.978 kg)  07/12/13 165 lb 12.8 oz (75.206 kg)  07/12/13 165 lb 12.8 oz (75.206 kg)  07/12/13 165 lb 12.8 oz (75.206 kg)  07/03/13 170 lb (77.111 kg)  05/30/13 171 lb 6 oz (77.735 kg)  05/14/13 174 lb 3.2 oz (79.017 kg)    Usual Body Weight: 167 lb  % Usual Body Weight: 107%  BMI:  Body mass index is 32.85  kg/(m^2).  Estimated Nutritional Needs: Kcal: 1700-1900 Protein: 80-90 gm Fluid: per MD  Skin: surgical chest incision   Diet Order: Carb Control  EDUCATION NEEDS: -Education needs addressed    Intake/Output Summary (Last 24 hours) at 07/30/13 1155 Last data filed at 07/29/13 1700  Gross per 24 hour  Intake    360 ml  Output      0 ml  Net    360 ml    Labs:   Recent Labs Lab 07/28/13 0535 07/29/13 0835 07/30/13 0405  NA 126* 121* 127*  K 5.1 3.7 4.0  CL 88* 84* 87*  CO2 25 24 29   BUN 13 14 11   CREATININE 0.95 0.79 0.80  CALCIUM 8.8 8.5 8.5  GLUCOSE 143* 148* 100*    CBG (last 3)   Recent Labs  07/29/13 2041 07/30/13 0641 07/30/13 1116  GLUCAP 105* 115* 131*    Scheduled Meds: . amiodarone  400 mg Oral BID  . atorvastatin  20 mg Oral Daily  . buPROPion  150 mg Oral Daily  . diltiazem  180 mg Oral Daily  . docusate sodium  200 mg Oral Daily  . famotidine  20 mg Oral BID  . fluticasone  2 puff Inhalation BID  . furosemide  80 mg Oral Daily  . guaiFENesin  600 mg Oral BID  . insulin aspart  0-15 Units Subcutaneous TID WC  . levothyroxine  150 mcg Oral QAC breakfast  . metFORMIN  500 mg Oral BID WC  . metoprolol tartrate  25 mg Oral BID  . multivitamin with minerals  1 tablet Oral Daily  . potassium chloride  40 mEq Oral Daily  . saccharomyces boulardii  250 mg Oral Daily  . sodium chloride  3 mL Intravenous Q12H  . warfarin  2.5 mg Oral ONCE-1800  . Warfarin - Physician Dosing Inpatient   Does not apply q1800    Continuous Infusions:   Past Medical History  Diagnosis Date  . Rheumatic heart disease     a. 07/2013 Echo: Ef 55-60%, Mod AS/AI, Mod-Sev MS, sev dil LA, PASP 58;  b. 07/2013 TEE: EF 35-40%, mild-mod AS/AI, mod-sev MS, LA smoke;  c. 07/2013 Cath: elev R heart pressures, nl cors.  . Gastroesophageal reflux disease   . Hyperlipidemia   . Cardioembolic stroke 01/2012    Right frontal in 01/2012; normal carotid ultrasound; possible LAA  thrombus by TEE; virtual complete neurologic recovery  . Fasting hyperglycemia     120 fasting  . Chronic kidney disease, stage 2, mildly decreased GFR     GFR of approximately 60  . Breast carcinoma 1995    1995  . Depression   . Diverticulosis of colon (without mention of hemorrhage) 2012    Dr. Karilyn Cota  . Gastroparesis   . Hemorrhoids   . Anemia   . Shortness of breath   . Hypertension     pt denies 05/30/13     Dr Hinton Rao  . Atrial fibrillation   . Hyponatremia     Past Surgical History  Procedure Laterality Date  . Mitral valve surgery  [facility], closed mitral valvulotomy by finger fracture  . Cardiac catheterization    . Dilation and curettage of uterus    . Breast lumpectomy Right 1995  . Tubal ligation  1973  . Tee without cardioversion  01/24/2012    Procedure: TRANSESOPHAGEAL ECHOCARDIOGRAM (TEE);  Surgeon: Lewayne Bunting, MD;  Location: [facility] ENDOSCOPY;  Service: Cardiovascular;  Laterality: N/A;  . Colonoscopy  2012    Negative screening procedure  . Esophageal manometry N/A 06/17/2013    Procedure: ESOPHAGEAL MANOMETRY (EM);  Surgeon: Mardella Layman, MD;  Location: [facility] ENDOSCOPY;  Service: Endoscopy;  Laterality: N/A;  . Tee without cardioversion N/A 07/11/2013    Procedure: TRANSESOPHAGEAL ECHOCARDIOGRAM (TEE);  Surgeon: Vesta Mixer, MD;  Location: [facility] ENDOSCOPY;  Service: Cardiovascular;  Laterality: N/A;  . Aortic valve replacement N/A 07/18/2013    Procedure: AORTIC VALVE REPLACEMENT (AVR);  Surgeon: [name], MD;  Location: [facility] OR;  Service: Open Heart Surgery;  Laterality: N/A;  . Intraoperative transesophageal echocardiogram N/A 07/18/2013    Procedure: INTRAOPERATIVE TRANSESOPHAGEAL ECHOCARDIOGRAM;  Surgeon: [name], MD;  Location: [facility] OR;  Service: Open Heart Surgery;  Laterality: N/A;  . Mitral valve replacement N/A 07/18/2013    Procedure: MITRAL VALVE (MV) REPLACEMENT;  Surgeon: [name], MD;  Location: [facility] OR;   Service: Open Heart Surgery;  Laterality: N/A;  . Tee without cardioversion N/A 07/26/2013    Procedure: TRANSESOPHAGEAL ECHOCARDIOGRAM (TEE);  Surgeon: Pricilla Riffle, MD;  Location: [facility] ENDOSCOPY;  Service: Cardiovascular;  Laterality: N/A;  . Cardioversion N/A 07/26/2013    Procedure: CARDIOVERSION;  Surgeon: Pricilla Riffle, MD;  Location: [facility] ENDOSCOPY;  Service: Cardiovascular;  Laterality: N/A;    Maureen Chatters, RD, LDN Pager #: [phone] After-Hours Pager #: [phone]

## 2013-07-30 NOTE — Progress Notes (Addendum)
[address]       [place] [address]             [phone]      4 Days Post-Op  Procedure(s) (LRB): TRANSESOPHAGEAL ECHOCARDIOGRAM (TEE) (N/A) CARDIOVERSION (N/A) Subjective: Some DOE, cough improved with tussinex  Objective  Telemetry rate controlled (?) aflutter/sinus  Temp:  [97.6 F (36.4 C)-98.2 F (36.8 C)] 98.1 F (36.7 C) (10/28 0615) Pulse Rate:  [69-77] 77 (10/28 0615) Resp:  [18] 18 (10/28 0615) BP: (130-159)/(49-73) 159/64 mmHg (10/28 0615) SpO2:  [95 %-99 %] 95 % (10/28 0615) Weight:  [179 lb 10.8 oz (81.5 kg)] 179 lb 10.8 oz (81.5 kg) (10/28 0500)   Intake/Output Summary (Last 24 hours) at 07/30/13 0736 Last data filed at 07/29/13 1700  Gross per 24 hour  Intake    363 ml  Output      0 ml  Net    363 ml       General appearance: alert, cooperative and no distress Heart: regular rate and rhythm Lungs: dim in right base Abdomen: soft, nontender Extremities: + edema BLE's Wound: incisions healing well  Lab Results:  Recent Labs  07/29/13 0835 07/30/13 0405  NA 121* 127*  K 3.7 4.0  CL 84* 87*  CO2 24 29  GLUCOSE 148* 100*  BUN 14 11  CREATININE 0.79 0.80  CALCIUM 8.5 8.5   No results found for this basename: AST, ALT, ALKPHOS, BILITOT, PROT, ALBUMIN,  in the last 72 hours No results found for this basename: LIPASE, AMYLASE,  in the last 72 hours No results found for this basename: WBC, NEUTROABS, HGB, HCT, MCV, PLT,  in the last 72 hours No results found for this basename: CKTOTAL, CKMB, TROPONINI,  in the last 72 hours No components found with this basename: POCBNP,  No results found for this basename: DDIMER,  in the last 72 hours No results found for this basename: HGBA1C,  in the last 72 hours No results found for this basename: CHOL, HDL, LDLCALC, TRIG, CHOLHDL,  in the last 72 hours No results found for this basename: TSH, T4TOTAL, FREET3, T3FREE, THYROIDAB,  in the last 72 hours No results found for  this basename: VITAMINB12, FOLATE, FERRITIN, TIBC, IRON, RETICCTPCT,  in the last 72 hours  Medications: Scheduled . amiodarone  400 mg Oral BID  . atorvastatin  20 mg Oral Daily  . buPROPion  150 mg Oral Daily  . diltiazem  180 mg Oral Daily  . docusate sodium  200 mg Oral Daily  . famotidine  20 mg Oral BID  . fluticasone  2 puff Inhalation BID  . guaiFENesin  600 mg Oral BID  . insulin aspart  0-15 Units Subcutaneous TID WC  . levothyroxine  150 mcg Oral QAC breakfast  . metFORMIN  500 mg Oral BID WC  . metoprolol tartrate  25 mg Oral BID  . multivitamin with minerals  1 tablet Oral Daily  . saccharomyces boulardii  250 mg Oral Daily  . sodium chloride  3 mL Intravenous Q12H  . Warfarin - Physician Dosing Inpatient   Does not apply q1800     Radiology/Studies:  Dg Chest 2 View  07/28/2013   CLINICAL DATA:  Right side pleural effusion  EXAM: CHEST  2 VIEW  COMPARISON:  07/26/2013  FINDINGS: Cardiomegaly again noted. Again noted status post median sternotomy and cardiac valve replacement. There is moderate size right pleural effusion increased from prior exam with  right lower lobe atelectasis or infiltrate. Surgical clips in right axilla again noted. No convincing pulmonary edema.  IMPRESSION: Moderate size right pleural effusion increased from prior exam with right lower lobe atelectasis or infiltrate. Surgical clips in right axilla again noted.   Electronically Signed   By: Natasha Mead M.D.   On: 07/28/2013 10:11    INR: 2.02 Will add last result for INR, ABG once components are confirmed Will add last 4 CBG results once components are confirmed  Assessment/Plan: S/P Procedure(s) (LRB): TRANSESOPHAGEAL ECHOCARDIOGRAM (TEE) (N/A) CARDIOVERSION (N/A)  1 Improving 2 sodium improved, may be able to restart diuretics soon 3 INR 2.02- poss thoracentesis today 4 sugars controlled 5 rhythm - rate controlled, dig stopped     LOS: 12 days    GOLD,WAYNE E 10/28/20147:36 AM    Chart reviewed, patient examined, agree with above. CXR shows increasing right pleural effusion. It is now moderate to large. INR is down to 2.0 after vit K yesterday. Will plan to do thoracentesis this morning.  Rhythm looks sinus this am in the 70's.  Sodium is better today after salt tablets yesterday. Will resume lasix.

## 2013-07-30 NOTE — CV Procedure (Signed)
  Cardiothoracic Surgery procedure note:  Right Thoracentesis   After informed consent obtained, the patient was positioned sitting on the side of her bed. The back was prepped with choroprep and draped using sterile technique. A time out was taken and the proper patient, proper side, and proper procedure were confirmed. 1% lidocaine with epinephrine was injected into the skin and subcutaneous tissue of the right lower back down to the pleural space. Upon entering the pleural space there was light serosanguinous fluid. The thoracentesis catheter was inserted and connected to a vacuum bottle. 1500 cc of serosanguinous fluid was removed without difficulty. The catheter was removed and a bandaid applied. CXR was ordered. She tolerated the procedure well.

## 2013-07-30 NOTE — Progress Notes (Signed)
Patient ID: [name], female   DOB: 1946-09-25, 67 y.o.   MRN: [id number] Subjective: No complaints Using IS  Objective: Filed Vitals:   07/29/13 2043 07/29/13 2131 07/30/13 0500 07/30/13 0615  BP: 133/49   159/64  Pulse: 73   77  Temp: 98.2 F (36.8 C)   98.1 F (36.7 C)  TempSrc: Oral   Oral  Resp: 18   18  Height:      Weight:   179 lb 10.8 oz (81.5 kg)   SpO2: 97% 97%  95%   Weight change: 1 lb 2.8 oz (0.533 kg)  Intake/Output Summary (Last 24 hours) at 07/30/13 1027 Last data filed at 07/29/13 1700  Gross per 24 hour  Intake    363 ml  Output      0 ml  Net    363 ml    General: Alert, awake, oriented x3, in no acute distress Neck:  JVP is normal Heart: Regular rate and rhythm, without murmurs, rubs, gallops.  Lungs: No wheezing Decreased BS right base  Exemities:  Plus one bilateral edema.   Neuro: Grossly intact, nonfocal.  Tel:  SR no afib 07/30/2013    Medications:Reviewed  CXR:  Reviewed  Sternotomy AVR/MVR  Large right pleural effusion   1.  Atrial fibrillation  Converted to SR .  Keep on 400 bid amio for now  Continue lopressor.  Switch dilt to 180 qd CD  May be able to d/c  Stop dig. Continue coumadin.  2.  F/E/N  Will check BMET.  Received lasix today x1  Volume looks better  Na pending.  Prob switch to po  Defer to primary service.  3.  Pleural effusion  For thoracentesis today   LOS: 12 days   Charlton Haws 07/30/2013, 8:21 AM

## 2013-07-30 NOTE — Progress Notes (Signed)
CARDIAC REHAB PHASE I   PRE:  Rate/Rhythm:  68 SR  BP:  Supine:   Sitting: 126/60  Standing: 120/58   SaO2: 90 RA  MODE:  Ambulation: 300 ft   POST:  Rate/Rhythm: 79  BP:  Supine:   Sitting: 138/64  Standing:    SaO2: 91 1L 1348-1420 On arrival pt finishing bath. We let her rest in recliner before walking. Assisted X 2 used walker and O2 1L to ambulate. Pt walked fast, leaned forward with walking. As she tires she leaned forward more. We attempted to get her to slow her pace to conserve energy, but was unable to get her to follow instructions.We held up up and back to slow pace and made her take two standing rest stops.DOE. She had difficulty steering walker and on return to room she fell into chair. Pt exhausted by end of walk. Pt unsafe with walking and trying to sit in chair. Pt in recliner with call light in reach and daughter present. Instructed pt not to get up without assistant.  Melina Copa RN 07/30/2013 2:17 PM

## 2013-07-31 LAB — BASIC METABOLIC PANEL
BUN: 10 mg/dL (ref 6–23)
Calcium: 8.3 mg/dL — ABNORMAL LOW (ref 8.4–10.5)
Chloride: 87 mEq/L — ABNORMAL LOW (ref 96–112)
GFR calc non Af Amer: 83 mL/min — ABNORMAL LOW (ref >90)
Glucose, Bld: 117 mg/dL — ABNORMAL HIGH (ref 70–99)
Potassium: 3.6 mEq/L (ref 3.5–5.1)
Sodium: 127 mEq/L — ABNORMAL LOW (ref 135–145)

## 2013-07-31 LAB — CBC
Hemoglobin: 9.8 g/dL — ABNORMAL LOW (ref 12.0–15.0)
MCH: 28.6 pg (ref 26.0–34.0)
Platelets: 355 10*3/uL (ref 150–400)
RBC: 3.43 MIL/uL — ABNORMAL LOW (ref 3.87–5.11)
RDW: 14.6 % (ref 11.5–15.5)
WBC: 16.3 10*3/uL — ABNORMAL HIGH (ref 4.0–10.5)

## 2013-07-31 LAB — GLUCOSE, CAPILLARY
Glucose-Capillary: 109 mg/dL — ABNORMAL HIGH (ref 70–99)
Glucose-Capillary: 147 mg/dL — ABNORMAL HIGH (ref 70–99)
Glucose-Capillary: 116 mg/dL — ABNORMAL HIGH (ref 70–99)

## 2013-07-31 LAB — PROTIME-INR: INR: 1.68 — ABNORMAL HIGH (ref 0.00–1.49)

## 2013-07-31 MED ORDER — AMIODARONE HCL 200 MG PO TABS
200.0000 mg | ORAL_TABLET | Freq: Two times a day (BID) | ORAL | Status: DC
  Administered 2013-07-31 – 2013-08-03 (×7): 200 mg via ORAL
  Filled 2013-07-31 (×8): qty 1

## 2013-07-31 MED ORDER — WARFARIN SODIUM 5 MG PO TABS
5.0000 mg | ORAL_TABLET | Freq: Once | ORAL | Status: AC
  Administered 2013-07-31: 5 mg via ORAL
  Filled 2013-07-31: qty 1

## 2013-07-31 NOTE — Progress Notes (Signed)
CARDIAC REHAB PHASE I   PRE:  Rate/Rhythm: 75 SR 1HB  BP:  Supine: [address]  Sitting:   Standing:    SaO2: 98% 1L    86%RA  MODE:  Ambulation: 300 ft   POST:  Rate/Rhythm: 83  BP:  Supine:   Sitting: 154/63  Standing:    SaO2: 93%2L 1335-1403 Pt walked 300 ft on 2L with rolling walker, gait belt and asst x 2 with fairly steady gait. Pt c/o legs feeling weak. Assisted pt to bathroom prior to walk and pt did not seem to know what to do. Helped her pull panties up and gave paper to use. Pt just seemed unaware. To recliner after walk. Husband in room. Desat on RA going to bathroom. Put on 2L to walk. Encouraged pt to stay close to walker during walk.   Luetta Nutting, RN BSN  07/31/2013 1:57 PM

## 2013-07-31 NOTE — Progress Notes (Signed)
Physical Therapy Treatment Patient Details Name: [name] MRN: [id number] DOB: Apr 30, 1946 Today's Date: 07/31/2013 Time: 4098-1191 PT Time Calculation (min): 15 min  PT Assessment / Plan / Recommendation  History of Present Illness 67 y.o. female admitted to [facility] on 07/18/13 with 3-week history of worsening DOE and lower extremity swelling. She was experiencing marked SOB with walking on level ground. On admission, chest CT was negative for pulmonary embolism but confirmed pulmonary edema. She improved with diuresis. She underwent cardiac catheterization and TEE. Cardiac cath showed normal coronary arteries. TEE revealed she had moderate to severe mitral stenosis with moderate aortic stenosis and mild to moderate aortic insufficiency. EF 35-40%. TCTS recommended tissue AVR and MVR which was done on 07/18/2013. Pt also with cardioversion on 10/24 which was unsuccessful. Re-attempt successful on 10/26. Planned throacentesis on 10/28 secondary to increased pulmonary edema.    PT Comments   Pt is progressing well with mobility.  She continues to need O2 [place] for gait as she drops to 86% on RA (90-91% on 1 L O2 [place]).  She continues to need constant assist for safety when she is up ambulating and does not seem to have any carryover of cues provided from previous sessions.  PT set up schedule with cardiac rehab.    Follow Up Recommendations  Home health PT;Supervision/Assistance - 24 hour     Does the patient have the potential to tolerate intense rehabilitation    NA  Barriers to Discharge   None      Equipment Recommendations  Rolling walker with 5" wheels    Recommendations for Other Services   None  Frequency Min 3X/week   Progress towards PT Goals Progress towards PT goals: Progressing toward goals  Plan Current plan remains appropriate    Precautions / Restrictions Precautions Precautions: Sternal;Fall Precaution Comments: also monitor O2 sats   Pertinent Vitals/Pain O2 sats on 1 L  O2 [place] at rest 96%, with gait on RA 86%, with gait on 1L O2 [place] 96%.  HR stable.      Mobility  Bed Mobility Bed Mobility: Not assessed (pt is OOB in recliner chair) Transfers Sit to Stand: 5: Supervision;From chair/3-in-1 Stand to Sit: 5: Supervision;To chair/3-in-1 Details for Transfer Assistance: cues to not use hands to get to standing and go to sitting.  Ambulation/Gait Ambulation/Gait Assistance: 4: Min assist Ambulation Distance (Feet): 300 Feet Assistive device: Rolling walker Ambulation/Gait Assistance Details: min assist for safety as pt fatigues her gait speed gets too fast and she starts pushing the RW to far in front of her.  Max verbal cues for safety and seemingly no carryover from session to session.   Gait Pattern: Step-through pattern;Decreased stride length;Shuffle Gait velocity: it actually gets too fast as she fatigues General Gait Details: O2 sats decreased to 86%on RA, increased to 96% on 1 L O2 [place]    Exercises General Exercises - Lower Extremity Long Arc Quad: AROM;Both;10 reps;Seated Hip ABduction/ADduction: AROM;Both;10 reps;Seated Hip Flexion/Marching: AROM;Both;10 reps;Seated Toe Raises: AROM;Both;10 reps;Seated Heel Raises: AROM;Both;10 reps;Seated Other Exercises Other Exercises: HEP given and reviewed with pt and her daughter.   Other Exercises: Incentive spirometer x 5 reps, max verbal cues for technique.  Max inhaled 570 mL:.     PT Goals (current goals can now be found in the care plan section) Acute Rehab PT Goals Patient Stated Goal: Agrees she wants to get better on her feet Potential to Achieve Goals: Good  Visit Information  Last PT Received On: 07/31/13 Assistance Needed: +  1 History of Present Illness: 67 y.o. female admitted to [name] on 07/18/13 with 3-week history of worsening DOE and lower extremity swelling. She was experiencing marked SOB with walking on level ground. On admission, chest CT was negative for pulmonary embolism but  confirmed pulmonary edema. She improved with diuresis. She underwent cardiac catheterization and TEE. Cardiac cath showed normal coronary arteries. TEE revealed she had moderate to severe mitral stenosis with moderate aortic stenosis and mild to moderate aortic insufficiency. EF 35-40%. TCTS recommended tissue AVR and MVR which was done on 07/18/2013. Pt also with cardioversion on 10/24 which was unsuccessful. Re-attempt successful on 10/26. Planned throacentesis on 10/28 secondary to increased pulmonary edema.     Subjective Data  Subjective: Pt reports that her legs feel weak.   Patient Stated Goal: Agrees she wants to get better on her feet   Cognition  Cognition Arousal/Alertness: Awake/alert Behavior During Therapy: WFL for tasks assessed/performed Overall Cognitive Status: Impaired/Different from baseline Area of Impairment: Safety/judgement;Awareness;Problem solving Safety/Judgement: Decreased awareness of safety;Decreased awareness of deficits Awareness: Emergent Problem Solving: Slow processing;Decreased initiation;Requires verbal cues;Requires tactile cues General Comments: per chart history of cognitive deficits after CVA in 2013, but worse now.        End of Session PT - End of Session Equipment Utilized During Treatment: Oxygen Activity Tolerance: Patient limited by fatigue Patient left: in chair;with call bell/phone within reach;with family/visitor present    Lurena Joiner B. Joshalyn Ancheta, PT, DPT [phone]   07/31/2013, 4:57 PM

## 2013-07-31 NOTE — Progress Notes (Signed)
[address]       [place] [address]             [phone]      5 Days Post-Op  Procedure(s) (LRB): TRANSESOPHAGEAL ECHOCARDIOGRAM (TEE) (N/A) CARDIOVERSION (N/A) Subjective: Feels ok, says breathing remains about the same in regards to SOB  Objective  Telemetry sinus, 1 deg AVB, QTc>600  Temp:  [97.9 F (36.6 C)-98.5 F (36.9 C)] 98.5 F (36.9 C) (10/29 0808) Pulse Rate:  [77-87] 87 (10/29 0808) Resp:  [18-21] 21 (10/29 0808) BP: (128-144)/(57-103) 128/65 mmHg (10/29 0808) SpO2:  [89 %-100 %] 89 % (10/29 0808) Weight:  [174 lb 3.2 oz (79.017 kg)] 174 lb 3.2 oz (79.017 kg) (10/29 0518)   Intake/Output Summary (Last 24 hours) at 07/31/13 0832 Last data filed at 07/30/13 1735  Gross per 24 hour  Intake    480 ml  Output      0 ml  Net    480 ml       General appearance: alert, cooperative and no distress Heart: regular rate and rhythm Lungs: mild crackles in right base Abdomen: soft, nontender Extremities: + BLE edema Wound: incisions healing well  Lab Results:  Recent Labs  07/30/13 0405 07/31/13 0431  NA 127* 127*  K 4.0 3.6  CL 87* 87*  CO2 29 32  GLUCOSE 100* 117*  BUN 11 10  CREATININE 0.80 0.79  CALCIUM 8.5 8.3*   No results found for this basename: AST, ALT, ALKPHOS, BILITOT, PROT, ALBUMIN,  in the last 72 hours No results found for this basename: LIPASE, AMYLASE,  in the last 72 hours  Recent Labs  07/31/13 0431  WBC 16.3*  HGB 9.8*  HCT 29.7*  MCV 86.6  PLT 355   No results found for this basename: CKTOTAL, CKMB, TROPONINI,  in the last 72 hours No components found with this basename: POCBNP,  No results found for this basename: DDIMER,  in the last 72 hours No results found for this basename: HGBA1C,  in the last 72 hours No results found for this basename: CHOL, HDL, LDLCALC, TRIG, CHOLHDL,  in the last 72 hours No results found for this basename: TSH, T4TOTAL, FREET3, T3FREE, THYROIDAB,  in the last 72  hours No results found for this basename: VITAMINB12, FOLATE, FERRITIN, TIBC, IRON, RETICCTPCT,  in the last 72 hours  Medications: Scheduled . amiodarone  400 mg Oral BID  . atorvastatin  20 mg Oral Daily  . buPROPion  150 mg Oral Daily  . diltiazem  180 mg Oral Daily  . docusate sodium  200 mg Oral Daily  . famotidine  20 mg Oral BID  . feeding supplement (GLUCERNA SHAKE)  237 mL Oral Q1500  . fluticasone  2 puff Inhalation BID  . furosemide  80 mg Oral Daily  . guaiFENesin  600 mg Oral BID  . insulin aspart  0-15 Units Subcutaneous TID WC  . levothyroxine  150 mcg Oral QAC breakfast  . metFORMIN  500 mg Oral BID WC  . metoprolol tartrate  25 mg Oral BID  . multivitamin with minerals  1 tablet Oral Daily  . potassium chloride  40 mEq Oral Daily  . saccharomyces boulardii  250 mg Oral Daily  . sodium chloride  3 mL Intravenous Q12H  . Warfarin - Physician Dosing Inpatient   Does not apply q1800     Radiology/Studies:  Dg Chest 2 View  07/30/2013   CLINICAL DATA:  Right pleural effusion.  EXAM: CHEST  2 VIEW  COMPARISON:  October 26 , 2014.  FINDINGS: Sternotomy wires are noted. Status post cardiac valve repair. Large right pleural effusion is noted which is increased compared to prior exam. Right axillary surgical clips are noted. Stable scarring noted in left lung.  IMPRESSION: Right pleural effusion is increased compared to prior exam.   Electronically Signed   By: Roque Lias M.D.   On: 07/30/2013 08:00   Dg Chest Port 1 View  07/30/2013   CLINICAL DATA:  Post right thoracentesis.  EXAM: PORTABLE CHEST - 1 VIEW  COMPARISON:  07/30/2013  FINDINGS: Prior median sternotomy and valve replacement. Cardiomegaly with vascular congestion. Suspect mild interstitial edema. Decreasing right pleural effusion following thoracentesis. Small right effusion persists with right lower lobe atelectasis. No pneumothorax.  IMPRESSION: Decreasing right effusion post thoracentesis. No pneumothorax.   Suspect mild interstitial edema/ CHF.  Small right effusion with right base atelectasis.   Electronically Signed   By: Charlett Nose M.D.   On: 07/30/2013 09:29    INR:1.68 Will add last result for INR, ABG once components are confirmed Will add last 4 CBG results once components are confirmed  Assessment/Plan: S/P Procedure(s) (LRB): TRANSESOPHAGEAL ECHOCARDIOGRAM (TEE) (N/A) CARDIOVERSION (N/A)  1 Steady improvement 2 Prolonged QTc, rhythm sinus- will decrease amio 3 sodium stable - cont lasix 80 po daily 4 will give 5 mg coumadin today 5 pulm toilet/wean O2 6 monitor leukocytosis 7 sugars controlled 8 push routine rehab as able  LOS: 13 days    Chrisanne Loose E 10/29/20148:32 AM

## 2013-08-01 ENCOUNTER — Inpatient Hospital Stay (HOSPITAL_COMMUNITY): Payer: Medicare Other

## 2013-08-01 LAB — PROTIME-INR: INR: 2 — ABNORMAL HIGH (ref 0.00–1.49)

## 2013-08-01 LAB — GLUCOSE, CAPILLARY: Glucose-Capillary: 104 mg/dL — ABNORMAL HIGH (ref 70–99)

## 2013-08-01 MED ORDER — WARFARIN SODIUM 2.5 MG PO TABS
2.5000 mg | ORAL_TABLET | Freq: Once | ORAL | Status: AC
  Administered 2013-08-01: 2.5 mg via ORAL
  Filled 2013-08-01: qty 1

## 2013-08-01 NOTE — Progress Notes (Addendum)
      [address]       [place] [address]             [phone]        6 Days Post-Op Procedure(s) (LRB): TRANSESOPHAGEAL ECHOCARDIOGRAM (TEE) (N/A) CARDIOVERSION (N/A)  Subjective: Patient eating breakfast without complaints this am.   Objective: Vital signs in last 24 hours: Temp:  [97.7 F (36.5 C)-98.5 F (36.9 C)] 98.5 F (36.9 C) (10/30 0607) Pulse Rate:  [71-87] 71 (10/30 0607) Cardiac Rhythm:  [-] Normal sinus rhythm;Heart block (10/29 2100) Resp:  [18-21] 20 (10/30 0607) BP: (128-145)/(58-67) 136/67 mmHg (10/30 0607) SpO2:  [86 %-97 %] 97 % (10/30 0607) Weight:  [77.792 kg (171 lb 8 oz)] 77.792 kg (171 lb 8 oz) (10/30 0607)  Pre op weight 76 kg Current Weight  08/01/13 77.792 kg (171 lb 8 oz)      Intake/Output from previous day: 10/29 0701 - 10/30 0700 In: 1080 [P.O.:1080] Out: -    Physical Exam:  Cardiovascular: RRR Pulmonary: Diminished at right base;left lung is clear no rales, wheezes, or rhonchi. Abdomen: Soft, non tender, bowel sounds present. Extremities: Bilateral lower extremity edema. Wounds: Clean and dry.  No erythema or signs of infection.  Lab Results: CBC:  Recent Labs  07/31/13 0431  WBC 16.3*  HGB 9.8*  HCT 29.7*  PLT 355   BMET:   Recent Labs  07/30/13 0405 07/31/13 0431  NA 127* 127*  K 4.0 3.6  CL 87* 87*  CO2 29 32  GLUCOSE 100* 117*  BUN 11 10  CREATININE 0.80 0.79  CALCIUM 8.5 8.3*    PT/INR:  Lab Results  Component Value Date   INR 2.00* 08/01/2013   INR 1.68* 07/31/2013   INR 2.02* 07/30/2013   ABG:  INR: Will add last result for INR, ABG once components are confirmed Will add last 4 CBG results once components are confirmed  Assessment/Plan:  1. CV - s/p unsuccessful cardioversion 10/24.She has mostly been A fl/fib with CVR. SR, first degree AV block.On Amiodarone 200 bid, Lopressor 25 bid, Cardizem CD 180 ,  and Coumadin. INR increased from 1.68 to 2. Low dose  Coumadin. 2. Pulmonary - s/p right thoracentesis 10/28.On room air, but needs oxygen when ambulating. Encourage incentive spirometer and flutter valve. Check CXR 3. Volume Overload - Continue Lasix PO again today 4.  Acute blood loss anemia - Last H and H 9.8 and 29.7 6.DM-CBGs 84/116/96. Pre op HGA1C 6.6. Continue on Metformin 500 bid. She will need follow up with medical doctor as outpatient.   ZIMMERMAN,[name] 08/01/2013,7:26 AM      Chart reviewed, patient examined, agree with above. She has felt better today and ambulating better. She still has some cough and cxr shows small right pleural effusion and basilar atelectasis. She is diuresing well and weight is only 4 lbs over preop. I would continue diuresis for now and follow up cxr on Saturday. If the right pleural effusion increases then she will probably need a pleurX catheter. If it stabilizes I can follow it as an outpatient.

## 2013-08-01 NOTE — Progress Notes (Signed)
CARDIAC REHAB PHASE I   PRE:  Rate/Rhythm: 76 SR    BP: sitting 145/45    SaO2: 98 RA  MODE:  Ambulation: 300 ft   POST:  Rate/Rhythm: 96 SR    BP: sitting 168/71     SaO2: 89 RA  Pt more alert today, talkative. Wanted to walk without RW so attempted this. Fairly steady with small steps, assist x2 with gait belt for supervision. Able to stay upright and control pace. Able to converse during entire walk and monitor her fatigue. SaO2 decreased walking but did not drop below 89 RA. Rest x2. Return to recliner with good decision making. Can be x1 now. Will be more independent with RW.  4098-1191   Harriet Masson CES, ACSM 08/01/2013 11:30 AM

## 2013-08-02 LAB — PROTIME-INR: Prothrombin Time: 26.1 seconds — ABNORMAL HIGH (ref 11.6–15.2)

## 2013-08-02 LAB — GLUCOSE, CAPILLARY
Glucose-Capillary: 107 mg/dL — ABNORMAL HIGH (ref 70–99)
Glucose-Capillary: 107 mg/dL — ABNORMAL HIGH (ref 70–99)
Glucose-Capillary: 130 mg/dL — ABNORMAL HIGH (ref 70–99)
Glucose-Capillary: 90 mg/dL (ref 70–99)

## 2013-08-02 MED ORDER — WARFARIN SODIUM 2.5 MG PO TABS
2.5000 mg | ORAL_TABLET | Freq: Every day | ORAL | Status: DC
  Administered 2013-08-02: 2.5 mg via ORAL
  Filled 2013-08-02 (×2): qty 1

## 2013-08-02 MED ORDER — WARFARIN SODIUM 5 MG PO TABS
5.0000 mg | ORAL_TABLET | Freq: Every day | ORAL | Status: DC
  Filled 2013-08-02: qty 1

## 2013-08-02 NOTE — Progress Notes (Addendum)
      [address]       [place] [address]             [phone]      7 Days Post-Op Procedure(s) (LRB): TRANSESOPHAGEAL ECHOCARDIOGRAM (TEE) (N/A) CARDIOVERSION (N/A)  Subjective:  [name] has no complaints this morning.  She states she is feeling pretty good. + ambulation with assist of rolling walker + BM  Objective: Vital signs in last 24 hours: Temp:  [98 F (36.7 C)-98.2 F (36.8 C)] 98.2 F (36.8 C) (10/31 0503) Pulse Rate:  [72-77] 72 (10/31 0503) Cardiac Rhythm:  [-] Heart block (10/30 2215) Resp:  [18] 18 (10/31 0503) BP: (135-145)/(45-55) 145/51 mmHg (10/31 0503) SpO2:  [97 %-98 %] 97 % (10/31 0503) Weight:  [169 lb 15.6 oz (77.1 kg)] 169 lb 15.6 oz (77.1 kg) (10/31 0503)  Intake/Output from previous day: 10/30 0701 - 10/31 0700 In: 720 [P.O.:720] Out: -   General appearance: alert, cooperative and no distress Heart: regular rate and rhythm Lungs: diminished breath sounds RLL Abdomen: soft, non-tender; bowel sounds normal; no masses,  no organomegaly Extremities: edema 1+, improving Wound: clean and dry  Lab Results:  Recent Labs  07/31/13 0431  WBC 16.3*  HGB 9.8*  HCT 29.7*  PLT 355   BMET:  Recent Labs  07/31/13 0431  NA 127*  K 3.6  CL 87*  CO2 32  GLUCOSE 117*  BUN 10  CREATININE 0.79  CALCIUM 8.3*    PT/INR:  Recent Labs  08/02/13 0430  LABPROT 26.1*  INR 2.49*   ABG    Component Value Date/Time   PHART 7.317* 07/19/2013 0427   HCO3 23.9 07/19/2013 0427   TCO2 24 07/19/2013 1624   ACIDBASEDEF 2.0 07/19/2013 0427   O2SAT 97.0 07/19/2013 0427   CBG (last 3)   Recent Labs  08/01/13 1634 08/01/13 2117 08/02/13 0616  GLUCAP 131* 107* 107*    Assessment/Plan: S/P Procedure(s) (LRB): TRANSESOPHAGEAL ECHOCARDIOGRAM (TEE) (N/A) CARDIOVERSION (N/A)  1. CV-Previous A.Fib/Flutter, currently NSR- on Amiodarone, Lopressor, Cardizem 2. INR 2.49- received 2.5 mg last night- per patient on rotating  dose of 5 mg MWF, 2.5 T,TH,Sat, Sun at home- will order 5 mg for tonight 3. Pulm-S/P Thoracentesis, + recurrence of right sided pleural effusion, currently small, repeat CXR in AM 4. Renal- remains volume overloaded with LE edema, this is improving significantly with weight slightly above baseline 5. Dispo- patient is stable, will repeat CXR in AM to assess pleural effusion, if remains stable can possibly d/c home over the weekend, however if increased with need Pleur-X placed   LOS: 15 days    [name], [name] 08/02/2013   Chart reviewed, patient examined, agree with above. She feels better is walking today without walker. She still has decreased breath sounds in the RLL and crackles and her breath sounds are a little worse today than yesterday. She has a PA/Lat CXR in am. If the right pleural effusion is increasing she will need a pleurX catheter, probably on Monday. If the effusion is small and stable she can go home on lasix 40 and follow up with me in the office in 1 week with a CXR.   Her INR is up to 2.5 today and she received 2.5 mg last night and 5 mg the night before. She is on amio and only needs an INR about 2-2.5 so I will only give her 2.5 mg tonight. High INR will make the pleural effusion worse.

## 2013-08-02 NOTE — Progress Notes (Signed)
Patient ambulated approximately 373ft independently with her husband at a steady pace.  Patient tolerated the ambulation well; vital signs stable. Patient returned to chair resting . Will continue to monitor.

## 2013-08-02 NOTE — Progress Notes (Signed)
Occupational Therapy Treatment Patient Details Name: [name] MRN: [id number] DOB: Dec 23, 1945 Today's Date: 08/02/2013 Time: 6440-3474 OT Time Calculation (min): 30 min  OT Assessment / Plan / Recommendation  History of present illness 67 y.o. female admitted to [facility] on 07/18/13 with 3-week history of worsening DOE and lower extremity swelling. She was experiencing marked SOB with walking on level ground. On admission, chest CT was negative for pulmonary embolism but confirmed pulmonary edema. She improved with diuresis. She underwent cardiac catheterization and TEE. Cardiac cath showed normal coronary arteries. TEE revealed she had moderate to severe mitral stenosis with moderate aortic stenosis and mild to moderate aortic insufficiency. EF 35-40%. TCTS recommended tissue AVR and MVR which was done on 07/18/2013. Pt also with cardioversion on 10/24 which was unsuccessful. Re-attempt successful on 10/26. Planned throacentesis on 10/28 secondary to increased pulmonary edema.    OT comments  Pt progressing well with BADLs.  Currently, she requires min guard assist.  Sats 95% on RA.   Sternal precautions and implications on ADLs discussed at length with pt and family   Follow Up Recommendations  No OT follow up;Supervision/Assistance - 24 hour    Barriers to Discharge       Equipment Recommendations  None recommended by OT    Recommendations for Other Services    Frequency Min 2X/week   Progress towards OT Goals Progress towards OT goals: Progressing toward goals  Plan Discharge plan remains appropriate    Precautions / Restrictions Precautions Precautions: Sternal;Fall Precaution Comments: also monitor O2 sats Restrictions Weight Bearing Restrictions: No Other Position/Activity Restrictions: Sternal precautions   Pertinent Vitals/Pain     ADL  Grooming: Wash/dry hands;Min guard Where Assessed - Grooming: Unsupported standing Lower Body Bathing: Min guard Where Assessed -  Lower Body Bathing: Unsupported sit to stand Upper Body Dressing: Supervision/safety Where Assessed - Upper Body Dressing: Unsupported sitting Lower Body Dressing: Min guard Where Assessed - Lower Body Dressing: Supported sit to stand Toilet Transfer: Hydrographic surveyor Method: Sit to Barista: Regular height toilet Toileting - Architect and Hygiene: Min guard Where Assessed - Engineer, mining and Hygiene: Sit to stand from 3-in-1 or toilet Transfers/Ambulation Related to ADLs: min guard assist ADL Comments: Pt requires min guard assist for balance.  Sats 95% on RA.  Discussed sternal precautions in depth - need and reason for precautions, how to don bra and activities to avoid    OT Diagnosis:    OT Problem List:   OT Treatment Interventions:     OT Goals(current goals can now be found in the care plan section) Acute Rehab OT Goals Time For Goal Achievement: 08/12/13 Potential to Achieve Goals: Good ADL Goals Pt Will Perform Grooming: with modified independence;standing Pt Will Perform Upper Body Bathing: with modified independence;sitting Pt Will Perform Lower Body Bathing: with modified independence;sit to/from stand Pt Will Perform Upper Body Dressing: with modified independence;sitting Pt Will Perform Lower Body Dressing: with modified independence;sit to/from stand Pt Will Transfer to Toilet: with modified independence;ambulating Pt Will Perform Toileting - Clothing Manipulation and hygiene: with modified independence;sit to/from stand Pt Will Perform Tub/Shower Transfer: Shower transfer;ambulating;rolling walker;with modified independence Additional ADL Goal #1: Pt will state 3 energy conservation strategies for selfcare tasks.  Visit Information  Last OT Received On: 08/02/13 Assistance Needed: +1 History of Present Illness: 67 y.o. female admitted to [facility] on 07/18/13 with 3-week history of worsening DOE and lower  extremity swelling. She was experiencing marked SOB with walking  on level ground. On admission, chest CT was negative for pulmonary embolism but confirmed pulmonary edema. She improved with diuresis. She underwent cardiac catheterization and TEE. Cardiac cath showed normal coronary arteries. TEE revealed she had moderate to severe mitral stenosis with moderate aortic stenosis and mild to moderate aortic insufficiency. EF 35-40%. TCTS recommended tissue AVR and MVR which was done on 07/18/2013. Pt also with cardioversion on 10/24 which was unsuccessful. Re-attempt successful on 10/26. Planned throacentesis on 10/28 secondary to increased pulmonary edema.     Subjective Data      Prior Functioning       Cognition  Cognition Arousal/Alertness: Awake/alert Behavior During Therapy: WFL for tasks assessed/performed Overall Cognitive Status: Impaired/Different from baseline Area of Impairment: Safety/judgement;Memory Memory: Decreased recall of precautions;Decreased short-term memory Safety/Judgement: Decreased awareness of safety Awareness: Emergent Problem Solving: Slow processing;Decreased initiation;Difficulty sequencing;Requires verbal cues General Comments: per chart history of cognitive deficits after CVA in 2013, but worse now.     Mobility  Bed Mobility Bed Mobility: Not assessed Transfers Transfers: Sit to Stand;Stand to Sit Sit to Stand: 5: Supervision;Without upper extremity assist;From chair/3-in-1;From toilet Stand to Sit: 5: Supervision;To chair/3-in-1;To toilet Details for Transfer Assistance: min verbal cues for hand pacement    Exercises      Balance High Level Balance High Level Balance Activites: Turns;Head turns High Level Balance Comments: Patient with slight decrease in balance with head turns.  Husband holding onto patient during entire ambulation.   End of Session OT - End of Session Activity Tolerance: Patient tolerated treatment well Patient left: in  chair;with call bell/phone within reach;with family/visitor present Nurse Communication: Mobility status  GO     Zaelyn Barbary M 08/02/2013, 4:08 PM

## 2013-08-02 NOTE — Progress Notes (Signed)
CARDIAC REHAB PHASE I   PRE:  Rate/Rhythm: 77 SR    BP: sitting 130/60    SaO2: 97 RA  MODE:  Ambulation: 450 ft   POST:  Rate/Rhythm: 94 SR    BP: sitting 142/70     SaO2: 89 RA  Pt wanted to walk with husband holding to her gown, pt holding to nothing. Observed this method. Pt fairly steady. Able to control her speed. Rest after 300 ft and then encouraged pt to walk x1 more lap, which she did. Pt is not fatiguing like she was a few days ago.  Able to control her posture and speed. SaO2 89 RA after walk. Up to 91 RA with deep breaths and rest. Ed completed with pt and family. Voiced understanding and asked appropriate questions. Would like to come to [facility] for CRPII.  [id number]  [name] CES, ACSM 08/02/2013 11:30 AM

## 2013-08-02 NOTE — Progress Notes (Signed)
Physical Therapy Treatment Patient Details Name: [name] MRN: [id number] DOB: 08-22-1946 Today's Date: 08/02/2013 Time: 9563-8756 PT Time Calculation (min): 24 min  PT Assessment / Plan / Recommendation  History of Present Illness 67 y.o. female admitted to [facility] on 07/18/13 with 3-week history of worsening DOE and lower extremity swelling. She was experiencing marked SOB with walking on level ground. On admission, chest CT was negative for pulmonary embolism but confirmed pulmonary edema. She improved with diuresis. She underwent cardiac catheterization and TEE. Cardiac cath showed normal coronary arteries. TEE revealed she had moderate to severe mitral stenosis with moderate aortic stenosis and mild to moderate aortic insufficiency. EF 35-40%. TCTS recommended tissue AVR and MVR which was done on 07/18/2013. Pt also with cardioversion on 10/24 which was unsuccessful. Re-attempt successful on 10/26. Planned throacentesis on 10/28 secondary to increased pulmonary edema.    PT Comments   Patient able to ambulate today without RW.  Noted slight decrease in balance with head turns without RW.  Would have RW for periods of fatigue for safety.  Patient and husband able to negotiate stairs with instruction and assist.    Follow Up Recommendations  Home health PT;Supervision/Assistance - 24 hour     Does the patient have the potential to tolerate intense rehabilitation     Barriers to Discharge        Equipment Recommendations  Rolling walker with 5" wheels (Patient needs RW for periods of fatigue for safety.)    Recommendations for Other Services    Frequency Min 3X/week   Progress towards PT Goals Progress towards PT goals: Progressing toward goals  Plan Current plan remains appropriate    Precautions / Restrictions Precautions Precautions: Sternal;Fall Restrictions Weight Bearing Restrictions: No Other Position/Activity Restrictions: Sternal precautions   Pertinent Vitals/Pain      Mobility  Bed Mobility Bed Mobility: Not assessed (Patient in chair as PT entered room) Transfers Transfers: Sit to Stand;Stand to Sit Sit to Stand: 5: Supervision;Without upper extremity assist;From chair/3-in-1 Stand to Sit: 5: Supervision;Without upper extremity assist;To chair/3-in-1 Details for Transfer Assistance: Verbal reminders not to use UE's to push up from chair. Ambulation/Gait Ambulation/Gait Assistance: 4: Min guard Ambulation Distance (Feet): 220 Feet Assistive device: None Ambulation/Gait Assistance Details: Patient ambulating without use of RW today with slight decrease in balance.  Cues to move at slow pace.  More controlled gait today. Gait Pattern: Step-through pattern;Decreased stride length General Gait Details: Patient required 1 sitting rest break following stairs. Stairs: Yes Stairs Assistance: 4: Min assist Stairs Assistance Details (indicate cue type and reason): Instructed patient and husband to negotiate stairs using 1 rail for balance.  Instructed husband on correct guarding technique. Stair Management Technique: One rail Right;Step to pattern;Forwards Number of Stairs: 4      PT Goals (current goals can now be found in the care plan section)    Visit Information  Last PT Received On: 08/02/13 Assistance Needed: +1 History of Present Illness: 67 y.o. female admitted to [facility] on 07/18/13 with 3-week history of worsening DOE and lower extremity swelling. She was experiencing marked SOB with walking on level ground. On admission, chest CT was negative for pulmonary embolism but confirmed pulmonary edema. She improved with diuresis. She underwent cardiac catheterization and TEE. Cardiac cath showed normal coronary arteries. TEE revealed she had moderate to severe mitral stenosis with moderate aortic stenosis and mild to moderate aortic insufficiency. EF 35-40%. TCTS recommended tissue AVR and MVR which was done on 07/18/2013. Pt also with cardioversion  on  10/24 which was unsuccessful. Re-attempt successful on 10/26. Planned throacentesis on 10/28 secondary to increased pulmonary edema.     Subjective Data  Subjective: "This will be her 4th walk today" - husband   Cognition  Cognition Arousal/Alertness: Awake/alert Behavior During Therapy: WFL for tasks assessed/performed Overall Cognitive Status: Impaired/Different from baseline Area of Impairment: Safety/judgement;Awareness;Problem solving;Memory Memory: Decreased recall of precautions;Decreased short-term memory Safety/Judgement: Decreased awareness of safety Awareness: Emergent Problem Solving: Slow processing;Decreased initiation;Difficulty sequencing;Requires verbal cues    Balance  High Level Balance High Level Balance Activites: Turns;Head turns High Level Balance Comments: Patient with slight decrease in balance with head turns.  Husband holding onto patient during entire ambulation.  End of Session PT - End of Session Equipment Utilized During Treatment: Gait belt Activity Tolerance: Patient limited by fatigue Patient left: in chair;with call bell/phone within reach;with family/visitor present Nurse Communication: Mobility status   GP     Vena Austria 08/02/2013, 4:04 PM Durenda Hurt. Renaldo Fiddler, [facility] Acute Rehab Services Pager [phone]

## 2013-08-03 ENCOUNTER — Inpatient Hospital Stay (HOSPITAL_COMMUNITY): Payer: Medicare Other

## 2013-08-03 LAB — GLUCOSE, CAPILLARY: Glucose-Capillary: 109 mg/dL — ABNORMAL HIGH (ref 70–99)

## 2013-08-03 MED ORDER — METFORMIN HCL 500 MG PO TABS: 500.0000 mg | ORAL_TABLET | Freq: Two times a day (BID) | ORAL | Status: AC

## 2013-08-03 MED ORDER — OXYCODONE HCL 5 MG PO TABS: 5.0000 mg | ORAL_TABLET | ORAL | Status: DC | PRN

## 2013-08-03 MED ORDER — WARFARIN SODIUM 2.5 MG PO TABS: 2.5000 mg | ORAL_TABLET | Freq: Every day | ORAL | Status: DC

## 2013-08-03 MED ORDER — POTASSIUM CHLORIDE CRYS ER 20 MEQ PO TBCR: 20.0000 meq | EXTENDED_RELEASE_TABLET | Freq: Every day | ORAL | Status: DC

## 2013-08-03 MED ORDER — LEVOTHYROXINE SODIUM 150 MCG PO TABS: 150.0000 ug | ORAL_TABLET | Freq: Every day | ORAL | Status: DC

## 2013-08-03 MED ORDER — GUAIFENESIN ER 600 MG PO TB12: 600.0000 mg | ORAL_TABLET | Freq: Two times a day (BID) | ORAL | Status: DC

## 2013-08-03 MED ORDER — FUROSEMIDE 40 MG PO TABS: 40.0000 mg | ORAL_TABLET | Freq: Every day | ORAL | Status: DC

## 2013-08-03 MED ORDER — AMIODARONE HCL 200 MG PO TABS: 200.0000 mg | ORAL_TABLET | Freq: Two times a day (BID) | ORAL | Status: DC

## 2013-08-03 MED ORDER — DILTIAZEM HCL ER COATED BEADS 180 MG PO CP24: 180.0000 mg | ORAL_CAPSULE | Freq: Every day | ORAL | Status: DC

## 2013-08-03 NOTE — Progress Notes (Signed)
Removed CT sutures per MD order per hospital policy. Applied benzoin and steri strips to suture site. No drainage. Patient tolerated well, will continue to monitor closely. Lajuana Matte, RN

## 2013-08-03 NOTE — Progress Notes (Signed)
       [address]       [place] [address]             [phone]          8 Days Post-Op Procedure(s) (LRB): TRANSESOPHAGEAL ECHOCARDIOGRAM (TEE) (N/A) CARDIOVERSION (N/A)  Subjective: Feels a little better today.  Breathing stable, no complaints.   Objective: Vital signs in last 24 hours: Patient Vitals for the past 24 hrs:  BP Temp Temp src Pulse Resp SpO2 Weight  08/03/13 0619 133/73 mmHg 98 F (36.7 C) Oral 69 19 96 % 169 lb 8 oz (76.885 kg)  08/02/13 1922 - - - - - 97 % -  08/02/13 1351 130/77 mmHg 98.7 F (37.1 C) Oral 69 18 98 % -  08/02/13 1146 120/64 mmHg - - 77 - - -  08/02/13 0911 - - - 77 18 96 % -   Current Weight  08/03/13 169 lb 8 oz (76.885 kg)     Intake/Output from previous day: 10/31 0701 - 11/01 0700 In: 480 [P.O.:480] Out: -     PHYSICAL EXAM:  Heart: RRR Lungs: Slightly decreased BS in bases Wound: Clean and dry Extremities: + LE edema    Lab Results: CBC:No results found for this basename: WBC, HGB, HCT, PLT,  in the last 72 hours BMET: No results found for this basename: NA, K, CL, CO2, GLUCOSE, BUN, CREATININE, CALCIUM,  in the last 72 hours  PT/INR:  Recent Labs  08/03/13 0449  LABPROT 27.8*  INR 2.71*    CXR: FINDINGS:  Median sternotomy wires with underlying prostatic bowels are again  noted. Cardiomegaly is stable. Surgical clips overlying the right  lung are unchanged.  There has been slight interval improvement in aeration of the lung  bases. The right pleural effusion persists but is slightly decreased  in size as compared to the prior examination. Small amount of  loculated fluid again seen along the right minor fissure. Associated  right basilar atelectasis persists. Left lung remains clear.  Previously seen interstitial edema is improved. No pneumothorax.  Surgical clips overlie the right axilla are again noted. No osseous  abnormality.  IMPRESSION:  1. Slight interval improvement in  right pleural effusion with  persistent right basilar atelectasis.  2. Slightly improved interstitial edema.   Assessment/Plan: S/P Procedure(s) (LRB): TRANSESOPHAGEAL ECHOCARDIOGRAM (TEE) (N/A) CARDIOVERSION (N/A) CV- AF, now maintaining SR.  Continue current meds. Pulm/R pleural effusion- decreased effusion on CXR, off O2.  Vol overload- continue Lasix. Since her effusion is improving and her pulmonary status is better today, will d/c home.  Continue Lasix 40 mg daily and will see in office in 1 week.   LOS: 16 days    Yailin Biederman H 08/03/2013

## 2013-08-05 ENCOUNTER — Telehealth: Payer: Self-pay | Admitting: *Deleted

## 2013-08-05 ENCOUNTER — Ambulatory Visit (INDEPENDENT_AMBULATORY_CARE_PROVIDER_SITE_OTHER): Payer: Medicare Other | Admitting: *Deleted

## 2013-08-05 DIAGNOSIS — Z7901 Long term (current) use of anticoagulants: Secondary | ICD-10-CM

## 2013-08-05 DIAGNOSIS — I4891 Unspecified atrial fibrillation: Secondary | ICD-10-CM

## 2013-08-05 DIAGNOSIS — I6340 Cerebral infarction due to embolism of unspecified cerebral artery: Secondary | ICD-10-CM

## 2013-08-05 DIAGNOSIS — Z954 Presence of other heart-valve replacement: Secondary | ICD-10-CM | POA: Insufficient documentation

## 2013-08-05 DIAGNOSIS — I639 Cerebral infarction, unspecified: Secondary | ICD-10-CM

## 2013-08-05 LAB — POCT INR: INR: 5.3

## 2013-08-05 NOTE — Telephone Encounter (Signed)
INR 5.3. PT HAS APPT WITH [facility] 08/06/13

## 2013-08-05 NOTE — Telephone Encounter (Signed)
See coumadin note. 

## 2013-08-06 ENCOUNTER — Ambulatory Visit (INDEPENDENT_AMBULATORY_CARE_PROVIDER_SITE_OTHER): Payer: Medicare Other | Admitting: Cardiology

## 2013-08-06 ENCOUNTER — Ambulatory Visit: Payer: Medicare Other | Admitting: Cardiology

## 2013-08-06 ENCOUNTER — Encounter: Payer: Self-pay | Admitting: Cardiology

## 2013-08-06 VITALS — BP 132/58 | HR 76 | Ht 62.0 in | Wt 167.0 lb

## 2013-08-06 DIAGNOSIS — I050 Rheumatic mitral stenosis: Secondary | ICD-10-CM

## 2013-08-06 DIAGNOSIS — I4891 Unspecified atrial fibrillation: Secondary | ICD-10-CM

## 2013-08-06 DIAGNOSIS — I359 Nonrheumatic aortic valve disorder, unspecified: Secondary | ICD-10-CM

## 2013-08-06 DIAGNOSIS — I10 Essential (primary) hypertension: Secondary | ICD-10-CM

## 2013-08-06 DIAGNOSIS — I350 Nonrheumatic aortic (valve) stenosis: Secondary | ICD-10-CM

## 2013-08-06 NOTE — Progress Notes (Signed)
Clinical Summary [name] is a 67 y.o.female  1. Valvular heart disease - patient originally presented 07/06/13 to [facility] with shortness of breath and DOE. Found to be in pulmonary edema, TTE showed severe mitral stenosis and possible significant aortic valve stenosis as well - patient diuresed with improved symptoms, transferred to [facility] for evaluation for possible valve replacement - prior to surgery, had cath that showed patent coronaries. TEE to better evaluate valves.  - 07/18/13 patient underwent MVR with 25 mm Edwards Magna-ease pericardial valve and also AVR with 21 mm Edwards Magna-ease pericardial valve - postop complicated by afib with RVR, started on amiodarone and diltiazem, underwent unsuccesful DCCV. She eventually converted with amiodarone. She also developed a right pleural effusion which was drained 07/30/13 with 1.5 liters of fluid removed. She was discharged on 07/24/13  - reports breathing is doing well. Walks around at home without significant limitations. Since surgery sleeps recliner because difficult to climb in bed. + LE edema. Has f/u with surgeons on the 12th.    2. Afib - denies an palpitaitons - on coumadin, INR 5.4 on 08/05/13. Patient followed in coumadin clinic, she is holding her coumadin with plans for repeat 08/07/13. Hasn't noticed any significant bleeding. - compliant w/ amio, dilitiazem, and metoprolol  3. Hyponatremia - last labs showed Na 127 on 07/31/13, tredning upward.  4. Anemia - last H&H 9.9 and 29.7 on 07/31/13 - patient denies any bleeding, no lightheadedness, dizziness, or SOB   Past Medical History  Diagnosis Date  . Rheumatic heart disease     a. 07/2013 Echo: Ef 55-60%, Mod AS/AI, Mod-Sev MS, sev dil LA, PASP 58;  b. 07/2013 TEE: EF 35-40%, mild-mod AS/AI, mod-sev MS, LA smoke;  c. 07/2013 Cath: elev R heart pressures, nl cors.  . Gastroesophageal reflux disease   . Hyperlipidemia   . Cardioembolic stroke 01/2012   Right frontal in 01/2012; normal carotid ultrasound; possible LAA thrombus by TEE; virtual complete neurologic recovery  . Fasting hyperglycemia     120 fasting  . Chronic kidney disease, stage 2, mildly decreased GFR     GFR of approximately 60  . Breast carcinoma 1995    1995  . Depression   . Diverticulosis of colon (without mention of hemorrhage) 2012    Dr. Karilyn Cota  . Gastroparesis   . Hemorrhoids   . Anemia   . Shortness of breath   . Hypertension     pt denies 05/30/13     Dr Hinton Rao  . Atrial fibrillation   . Hyponatremia      No Known Allergies   Current Outpatient Prescriptions  Medication Sig Dispense Refill  . albuterol (VENTOLIN HFA) 108 (90 BASE) MCG/ACT inhaler Inhale 2 puffs into the lungs every 4 (four) hours as needed for wheezing.      Marland Kitchen amiodarone (PACERONE) 200 MG tablet Take 1 tablet (200 mg total) by mouth 2 (two) times daily.  60 tablet  1  . atorvastatin (LIPITOR) 10 MG tablet Take 20 mg by mouth daily.      . beclomethasone (QVAR) 80 MCG/ACT inhaler Inhale 2 puffs into the lungs 2 (two) times daily.  1 Inhaler  6  . buPROPion (WELLBUTRIN XL) 150 MG 24 hr tablet Take 150 mg by mouth daily.        Marland Kitchen dexlansoprazole (DEXILANT) 60 MG capsule Take 1 capsule (60 mg total) by mouth daily.  90 capsule  3  . diltiazem (CARDIZEM CD) 180  MG 24 hr capsule Take 1 capsule (180 mg total) by mouth daily.  30 capsule  1  . furosemide (LASIX) 40 MG tablet Take 1 tablet (40 mg total) by mouth daily.  14 tablet  0  . guaiFENesin (MUCINEX) 600 MG 12 hr tablet Take 1 tablet (600 mg total) by mouth 2 (two) times daily.      Marland Kitchen HYDROcodone-homatropine (HYCODAN) 5-1.5 MG/5ML syrup Take 5 mLs by mouth every 4 (four) hours as needed for cough.  120 mL  0  . hyoscyamine (LEVSIN SL) 0.125 MG SL tablet Place 1 tablet (0.125 mg total) under the tongue every 4 (four) hours as needed for cramping.  30 tablet  0  . levothyroxine (SYNTHROID, LEVOTHROID) 150 MCG tablet Take 1 tablet  (150 mcg total) by mouth daily before breakfast.  30 tablet  1  . metFORMIN (GLUCOPHAGE) 500 MG tablet Take 1 tablet (500 mg total) by mouth 2 (two) times daily with a meal.  60 tablet  1  . metoprolol tartrate (LOPRESSOR) 25 MG tablet Take 1 tablet (25 mg total) by mouth 2 (two) times daily.  60 tablet  6  . Multiple Vitamin (MULITIVITAMIN WITH MINERALS) TABS Take 1 tablet by mouth daily.      Marland Kitchen oxyCODONE (OXY IR/ROXICODONE) 5 MG immediate release tablet Take 1-2 tablets (5-10 mg total) by mouth every 3 (three) hours as needed for pain.  30 tablet  0  . potassium chloride SA (K-DUR,KLOR-CON) 20 MEQ tablet Take 1 tablet (20 mEq total) by mouth daily.  14 tablet  0  . saccharomyces boulardii (FLORASTOR) 250 MG capsule Take 250 mg by mouth daily.      . sucralfate (CARAFATE) 1 GM/10ML suspension Can take every 4 hours as needed  420 mL  2  . warfarin (COUMADIN) 2.5 MG tablet Take 1 tablet (2.5 mg total) by mouth daily. Or as directed by Coumadin Clinic  40 tablet  1   No current facility-administered medications for this visit.     Past Surgical History  Procedure Laterality Date  . Mitral valve surgery  [facility], closed mitral valvulotomy by finger fracture  . Cardiac catheterization    . Dilation and curettage of uterus    . Breast lumpectomy Right 1995  . Tubal ligation  1973  . Tee without cardioversion  01/24/2012    Procedure: TRANSESOPHAGEAL ECHOCARDIOGRAM (TEE);  Surgeon: Lewayne Bunting, MD;  Location: [facility] ENDOSCOPY;  Service: Cardiovascular;  Laterality: N/A;  . Colonoscopy  2012    Negative screening procedure  . Esophageal manometry N/A 06/17/2013    Procedure: ESOPHAGEAL MANOMETRY (EM);  Surgeon: Mardella Layman, MD;  Location: [facility] ENDOSCOPY;  Service: Endoscopy;  Laterality: N/A;  . Tee without cardioversion N/A 07/11/2013    Procedure: TRANSESOPHAGEAL ECHOCARDIOGRAM (TEE);  Surgeon: Vesta Mixer, MD;  Location: [facility] ENDOSCOPY;  Service: Cardiovascular;  Laterality:  N/A;  . Aortic valve replacement N/A 07/18/2013    Procedure: AORTIC VALVE REPLACEMENT (AVR);  Surgeon: [name], MD;  Location: [facility] OR;  Service: Open Heart Surgery;  Laterality: N/A;  . Intraoperative transesophageal echocardiogram N/A 07/18/2013    Procedure: INTRAOPERATIVE TRANSESOPHAGEAL ECHOCARDIOGRAM;  Surgeon: [name], MD;  Location: [facility] OR;  Service: Open Heart Surgery;  Laterality: N/A;  . Mitral valve replacement N/A 07/18/2013    Procedure: MITRAL VALVE (MV) REPLACEMENT;  Surgeon: [name], MD;  Location: [facility] OR;  Service: Open Heart Surgery;  Laterality: N/A;  . Tee without  cardioversion N/A 07/26/2013    Procedure: TRANSESOPHAGEAL ECHOCARDIOGRAM (TEE);  Surgeon: Pricilla Riffle, MD;  Location: [facility] ENDOSCOPY;  Service: Cardiovascular;  Laterality: N/A;  . Cardioversion N/A 07/26/2013    Procedure: CARDIOVERSION;  Surgeon: Pricilla Riffle, MD;  Location: [facility] ENDOSCOPY;  Service: Cardiovascular;  Laterality: N/A;     No Known Allergies    Family History  Problem Relation Age of Onset  . Heart disease Brother 50    MI  . Rheum arthritis Maternal Grandmother   . Asthma Maternal Grandfather   . Hypothyroidism Mother   . Diabetes Sister   . Cirrhosis Father   . Cervical cancer Sister     malignant neoplasm of cervix uteri     Social History Ms. Ergle reports that she has never smoked. She has never used smokeless tobacco. Ms. Minchew reports that she does not drink alcohol.   Review of Systems CONSTITUTIONAL: No weight loss, fever, chills, weakness or fatigue.  HEENT: Eyes: No visual loss, blurred vision, double vision or yellow sclerae.No hearing loss, sneezing, congestion, runny nose or sore throat.  SKIN: No rash or itching.  CARDIOVASCULAR: per HPI RESPIRATORY: per HPI GASTROINTESTINAL: No anorexia, nausea, vomiting or diarrhea. No abdominal pain or blood.  GENITOURINARY: No burning on urination, no polyuria NEUROLOGICAL: No headache, dizziness,  syncope, paralysis, ataxia, numbness or tingling in the extremities. No change in bowel or bladder control.  MUSCULOSKELETAL: No muscle, back pain, joint pain or stiffness.  LYMPHATICS: No enlarged nodes. No history of splenectomy.  PSYCHIATRIC: No history of depression or anxiety.  ENDOCRINOLOGIC: No reports of sweating, cold or heat intolerance. No polyuria or polydipsia.  Marland Kitchen   Physical Examination p 76 bp 132/58 Wt 167 lbs BMI 30 Gen: resting comfortably, no acute distress HEENT: no scleral icterus, pupils equal round and reactive, no palptable cervical adenopathy,  CV: RRR, no m/r/g, no JVD, no carotid bruit Resp: Clear to auscultation bilaterally GI: abdomen is soft, non-tender, non-distended, normal bowel sounds, no hepatosplenomegaly MSK: extremities are warm, 2+ bilateral edema Skin: warm, no rash Neuro:  no focal deficits Psych: appropriate affect   Diagnostic Studies 07/06/13 TEE Left ventricle: Systolic function was moderately reduced. The estimated ejection fraction was in the range of 35% to 40%. - Aortic valve: There was mild to moderate stenosis. Mild to moderate regurgitation. - Mitral valve: The findings are consistent with moderate to severe stenosis. Valve area by pressure half-time: 1.45cm^2. - Left atrium: There was spontaneous echo contrast ("smoke"). - Atrial septum: No defect or patent foramen ovale was identified. - Tricuspid valve: No evidence of vegetation.   07/26/13 TEE Study Conclusions  Left atrium: No thrombus in LA. Patient is s/p ligation of appendage. No flow in this region. Left ventricle: LV is foreshortened but systolic function appears grossly normal.  ------------------------------------------------------------ Aortic valve: AV prosthesis opens well. No AI.  ------------------------------------------------------------ Aorta: Mild fixed plaque in the thoracic  aorta  ------------------------------------------------------------ Mitral valve: MV prosthesis moves wellTrace MR. Doppler: Peak gradient: 20mm Hg (D).  07/10/13 Cath Hemodynamic Findings:  Ao: 132/57  LV: 144/2/11  RA: 10  RV: 63/2/10  PA: 62/19 (mean 41)  PCWP: 19  Fick Cardiac Output: 3.73 L/min  Fick Cardiac Index: 2.11 L/min/m2  Central Aortic Saturation: 94%  Pulmonary Artery Saturation: 58%  Aortic valve: Peak to peak gradient 12 mm Hg. Valve easily crossed with J-wire.  Angiographic Findings:  Left main: No obstructive disease.  Left Anterior Descending Artery: Large caliber vessel that courses to the apex. The  mid and distal vessel becomes small in caliber. Small caliber diagonal Demonta Wombles. No obstructive disease.  Circumflex Artery: Large caliber vessel with two small obtuse marginal branches. No obstructive disease.  Right Coronary Artery: Large, dominant vessel with no obstructive disease.  Left Ventricular Angiogram: Deferred.  Impression:  1. No angiographic evidence of CAD  2. Elevated filling pressures      Pertinent labs 08/05/13 INR: 5.3  07/31/13: Na 127 K 3.6 Cl 87 CO2 32 Cr 0.79  Assessment and Plan  1. Valvular heart disease - history of mitral and aortic stenosis, s/p tissue valve replacement of both valves - doing well from a symptoms standpoint, last imaging 07/26/13 TEE describes normal functioning prosthetic valves - she has follow up with surgery next week - continue to follow clinically  2. Afib - difficult to control postoperatively, she is on amio, metop, and dilt - denies any significant symptoms, she is rate controlled today in clinic. - her INR was supratherapeutic, her coumadin is on hold and she has follow up in coumadin clinic tomorrow. Denies any bleeding - continue current meds, likely decrease amio to 200mg  daily at nexta appt  3. Hyponatremia - thought to be lasix induced from the hospital records - will repeat CMET  4.  Anemia - likely related to blood losses during surgery and hospital lab draws - denies any symptoms - repeat CBC   F/u 1 month   Antoine Poche, M.D., F.A.C.C.

## 2013-08-06 NOTE — Patient Instructions (Addendum)
Your physician recommends that you schedule a follow-up appointment in:  1 month with Dr Lurena Joiner will receive a reminder letter two months in advance reminding you to call and schedule your appointment. If you don't receive this letter, please contact our office.  Your physician recommends that you return for lab work in 1 week. CBC,CMET

## 2013-08-07 ENCOUNTER — Telehealth: Payer: Self-pay | Admitting: *Deleted

## 2013-08-07 ENCOUNTER — Ambulatory Visit (INDEPENDENT_AMBULATORY_CARE_PROVIDER_SITE_OTHER): Payer: Medicare Other | Admitting: *Deleted

## 2013-08-07 DIAGNOSIS — I639 Cerebral infarction, unspecified: Secondary | ICD-10-CM

## 2013-08-07 DIAGNOSIS — I4891 Unspecified atrial fibrillation: Secondary | ICD-10-CM

## 2013-08-07 DIAGNOSIS — Z7901 Long term (current) use of anticoagulants: Secondary | ICD-10-CM

## 2013-08-07 DIAGNOSIS — I6340 Cerebral infarction due to embolism of unspecified cerebral artery: Secondary | ICD-10-CM

## 2013-08-07 DIAGNOSIS — Z954 Presence of other heart-valve replacement: Secondary | ICD-10-CM

## 2013-08-07 LAB — PROTIME-INR: INR: 3.1 — AB (ref 0.9–1.1)

## 2013-08-07 NOTE — Telephone Encounter (Signed)
received a call from pt daughter to advise the pt was placed on several new medications 14 days ago per open heart surgery discharge included new medications including amiodarone and diltiazem, pt rash started today, noted the rash is NOT raised however does itch a lot and appears to mimic contact dermatitis, the itching is resolved slightly by oral benadryl however benadryl/hydrocortisonce gel is not resolving the rash, the pt daughter concerned her mother may have abnormal potassium/sodium levels and the doctor may want her to have a panal/kim 7 level drawn tomorrow, advised this nurse will call with instructions once received from the doctor, pt daughter understood and confirmed the pt has taking all her current medications this am, pending evening medications and denies /Chest pain/swelling, notes slight fatigue and slight increase in SOB, please advise

## 2013-08-07 NOTE — Telephone Encounter (Signed)
Mrs. Crowl daughter called with concerns regarding a small red rash that is covering the trunk of her body that is itching very badly and Benadryl is not helping.  She said she had a rash in the hospital that was thought to be electrolyte imbalance related. The ras started yesterday.  She did see her cardiologist, Dr. Wyline Mood, yesterday.  I said she should call him since she just saw him and he is managing all of her meds at this point.  She agreed.

## 2013-08-07 NOTE — Telephone Encounter (Signed)
Spoke with patient's daughter. Unclear which medication might be causing this rash, she has been on these medications for several weeks including her amiodarone. Will decrease amio to 200mg  daily, patient has labs ordered previously and will have them done tomorrow to follow up on her hyponatremia and anemia.

## 2013-08-08 ENCOUNTER — Other Ambulatory Visit: Payer: Self-pay | Admitting: *Deleted

## 2013-08-08 ENCOUNTER — Telehealth: Payer: Self-pay | Admitting: Cardiology

## 2013-08-08 ENCOUNTER — Other Ambulatory Visit: Payer: Self-pay

## 2013-08-08 LAB — COMPREHENSIVE METABOLIC PANEL
AST: 26 U/L (ref 0–37)
Albumin: 3 g/dL — ABNORMAL LOW (ref 3.5–5.2)
Alkaline Phosphatase: 132 U/L — ABNORMAL HIGH (ref 39–117)
BUN: 8 mg/dL (ref 6–23)
CO2: 27 mEq/L (ref 19–32)
Calcium: 8.9 mg/dL (ref 8.4–10.5)
Chloride: 100 mEq/L (ref 96–112)
Glucose, Bld: 140 mg/dL — ABNORMAL HIGH (ref 70–99)
Potassium: 4.7 mEq/L (ref 3.5–5.3)
Sodium: 138 mEq/L (ref 135–145)
Total Protein: 6.5 g/dL (ref 6.0–8.3)

## 2013-08-08 LAB — CBC
Hemoglobin: 11.1 g/dL — ABNORMAL LOW (ref 12.0–15.0)
MCHC: 32.6 g/dL (ref 30.0–36.0)
RDW: 14.9 % (ref 11.5–15.5)
WBC: 12.8 10*3/uL — ABNORMAL HIGH (ref 4.0–10.5)

## 2013-08-08 MED ORDER — HYDROXYZINE HCL 25 MG PO TABS: 25.0000 mg | ORAL_TABLET | Freq: Three times a day (TID) | ORAL | Status: DC | PRN

## 2013-08-08 MED ORDER — PREDNISONE 20 MG PO TABS: 40.0000 mg | ORAL_TABLET | Freq: Every day | ORAL | Status: DC

## 2013-08-08 NOTE — Telephone Encounter (Signed)
Spoke with patient's daughter. Rash has worsened, very pruritic. Not responding to oral benadryl and topical hyrdocortisone. Feel its likely due to amiodarone, will discontinue. Will give a 5 day course of prednisone 40mg  daily, and also give hydroxyzine prn for itching.   Dina Rich MD

## 2013-08-09 ENCOUNTER — Ambulatory Visit (INDEPENDENT_AMBULATORY_CARE_PROVIDER_SITE_OTHER): Payer: Medicare Other | Admitting: *Deleted

## 2013-08-09 DIAGNOSIS — Z7901 Long term (current) use of anticoagulants: Secondary | ICD-10-CM

## 2013-08-09 DIAGNOSIS — I4891 Unspecified atrial fibrillation: Secondary | ICD-10-CM

## 2013-08-09 DIAGNOSIS — I639 Cerebral infarction, unspecified: Secondary | ICD-10-CM

## 2013-08-09 DIAGNOSIS — Z954 Presence of other heart-valve replacement: Secondary | ICD-10-CM

## 2013-08-09 DIAGNOSIS — I6340 Cerebral infarction due to embolism of unspecified cerebral artery: Secondary | ICD-10-CM

## 2013-08-12 ENCOUNTER — Ambulatory Visit (INDEPENDENT_AMBULATORY_CARE_PROVIDER_SITE_OTHER): Payer: Medicare Other | Admitting: *Deleted

## 2013-08-12 ENCOUNTER — Other Ambulatory Visit: Payer: Self-pay | Admitting: *Deleted

## 2013-08-12 ENCOUNTER — Telehealth: Payer: Self-pay | Admitting: *Deleted

## 2013-08-12 DIAGNOSIS — I639 Cerebral infarction, unspecified: Secondary | ICD-10-CM

## 2013-08-12 DIAGNOSIS — I4891 Unspecified atrial fibrillation: Secondary | ICD-10-CM

## 2013-08-12 DIAGNOSIS — I6340 Cerebral infarction due to embolism of unspecified cerebral artery: Secondary | ICD-10-CM

## 2013-08-12 DIAGNOSIS — I350 Nonrheumatic aortic (valve) stenosis: Secondary | ICD-10-CM

## 2013-08-12 DIAGNOSIS — Z954 Presence of other heart-valve replacement: Secondary | ICD-10-CM

## 2013-08-12 DIAGNOSIS — Z7901 Long term (current) use of anticoagulants: Secondary | ICD-10-CM

## 2013-08-12 LAB — POCT INR: INR: 1.8

## 2013-08-12 NOTE — Telephone Encounter (Signed)
See coumadin note. 

## 2013-08-12 NOTE — Telephone Encounter (Signed)
INR 1.8

## 2013-08-14 ENCOUNTER — Encounter: Payer: Self-pay | Admitting: Surgery

## 2013-08-14 ENCOUNTER — Ambulatory Visit (INDEPENDENT_AMBULATORY_CARE_PROVIDER_SITE_OTHER): Payer: Self-pay | Admitting: Surgery

## 2013-08-14 ENCOUNTER — Telehealth: Payer: Self-pay

## 2013-08-14 ENCOUNTER — Other Ambulatory Visit: Payer: Self-pay

## 2013-08-14 ENCOUNTER — Ambulatory Visit
Admission: RE | Admit: 2013-08-14 | Discharge: 2013-08-14 | Disposition: A | Discharge status: Home / Self Care | Payer: Medicare Other | Source: Ambulatory Visit | Attending: Surgery | Admitting: Surgery

## 2013-08-14 VITALS — BP 112/70 | HR 88 | Resp 19 | Ht 62.0 in | Wt 162.0 lb

## 2013-08-14 DIAGNOSIS — I059 Rheumatic mitral valve disease, unspecified: Secondary | ICD-10-CM

## 2013-08-14 DIAGNOSIS — I359 Nonrheumatic aortic valve disorder, unspecified: Secondary | ICD-10-CM

## 2013-08-14 DIAGNOSIS — I10 Essential (primary) hypertension: Secondary | ICD-10-CM

## 2013-08-14 DIAGNOSIS — R609 Edema, unspecified: Secondary | ICD-10-CM

## 2013-08-14 DIAGNOSIS — I350 Nonrheumatic aortic (valve) stenosis: Secondary | ICD-10-CM

## 2013-08-14 MED ORDER — POTASSIUM CHLORIDE CRYS ER 20 MEQ PO TBCR: 20.0000 meq | EXTENDED_RELEASE_TABLET | Freq: Every day | ORAL | Status: DC

## 2013-08-14 MED ORDER — FUROSEMIDE 40 MG PO TABS: 40.0000 mg | ORAL_TABLET | Freq: Every day | ORAL | Status: DC

## 2013-08-14 NOTE — Progress Notes (Signed)
HPI:  Patient returns for routine postoperative follow-up having undergone aortic and mitral valve replacements using bovine pericardial valves on 07/19/2013. The patient's early postoperative recovery while in the hospital was notable for a slow postoperative course due to atrial flutter and fibrillation with RVR that was difficult to control and development of a moderate right pleural effusion requiring a thoracentesis. She underwent TEE directed DCCV and maintained sinus rhythm on amiodarone, cardizem and metoprolol. Since hospital discharge the patient reports that she has continued to feel stronger. She is walking without dyspnea. She did develop a rash with itching at home and it was felt that this was most likely due to the amiodarone which was stopped. Her rash and itching have markedly improved.   Current Outpatient Prescriptions  Medication Sig Dispense Refill  . albuterol (VENTOLIN HFA) 108 (90 BASE) MCG/ACT inhaler Inhale 2 puffs into the lungs every 4 (four) hours as needed for wheezing.      Marland Kitchen atorvastatin (LIPITOR) 10 MG tablet Take 20 mg by mouth daily.      . beclomethasone (QVAR) 80 MCG/ACT inhaler Inhale 2 puffs into the lungs 2 (two) times daily.  1 Inhaler  6  . buPROPion (WELLBUTRIN XL) 150 MG 24 hr tablet Take 150 mg by mouth daily.        Marland Kitchen dexlansoprazole (DEXILANT) 60 MG capsule Take 1 capsule (60 mg total) by mouth daily.  90 capsule  3  . diltiazem (CARDIZEM CD) 180 MG 24 hr capsule Take 1 capsule (180 mg total) by mouth daily.  30 capsule  1  . HYDROcodone-homatropine (HYCODAN) 5-1.5 MG/5ML syrup Take 5 mLs by mouth every 4 (four) hours as needed for cough.  120 mL  0  . hydrOXYzine (ATARAX/VISTARIL) 25 MG tablet Take 1 tablet (25 mg total) by mouth every 8 (eight) hours as needed.  30 tablet  0  . levothyroxine (SYNTHROID, LEVOTHROID) 150 MCG tablet Take 1 tablet (150 mcg total) by mouth daily before breakfast.  30 tablet  1  . metFORMIN (GLUCOPHAGE) 500 MG  tablet Take 1 tablet (500 mg total) by mouth 2 (two) times daily with a meal.  60 tablet  1  . metoprolol tartrate (LOPRESSOR) 25 MG tablet Take 1 tablet (25 mg total) by mouth 2 (two) times daily.  60 tablet  6  . Multiple Vitamin (MULITIVITAMIN WITH MINERALS) TABS Take 1 tablet by mouth daily.      Marland Kitchen oxyCODONE (OXY IR/ROXICODONE) 5 MG immediate release tablet Take 1-2 tablets (5-10 mg total) by mouth every 3 (three) hours as needed for pain.  30 tablet  0  . saccharomyces boulardii (FLORASTOR) 250 MG capsule Take 250 mg by mouth daily.      Marland Kitchen warfarin (COUMADIN) 2.5 MG tablet Take 1 tablet (2.5 mg total) by mouth daily. Or as directed by Coumadin Clinic  40 tablet  1  . furosemide (LASIX) 40 MG tablet Take 1 tablet (40 mg total) by mouth daily.  30 tablet  0  . potassium chloride SA (K-DUR,KLOR-CON) 20 MEQ tablet Take 1 tablet (20 mEq total) by mouth daily.  30 tablet  0   No current facility-administered medications for this visit.    Physical Exam: BP 112/70  Pulse 88  Resp 19  Ht 5\' 2"  (1.575 m)  Wt 162 lb (73.483 kg)  BMI 29.62 kg/m2  SpO2 97% She looks well Lungs are clear Cardiac exam shows a regular rate and rhythm with normal valve sounds The chest incision  is healing well and the sternum is stable. There is no peripheral edema.  Diagnostic Tests:  CLINICAL DATA: Follow-up aortic valve replacement  EXAM:  CHEST 2 VIEW  COMPARISON: 08/03/2013  FINDINGS:  Mild patchy right basilar opacity, likely atelectasis, with  associated small right pleural effusion. This appearance is  improved.  No pneumothorax.  The heart is top-normal in size. Prosthetic valves.  Postsurgical changes in the right chest wall/axilla.  Visualized osseous structures are within normal limits.  IMPRESSION:  Improving right basilar atelectasis with small right pleural  effusion.  No pneumothorax.  Electronically Signed  By: Charline Bills M.D.  On: 08/14/2013 13:53   Impression:  Overall  I think she is doing well following her surgery especially considering her preoperative debilitation due to the valve disease and severe pulmonary hypertension. She appears to be maintaining sinus rhythm on cardizem and metoprolol. She is therapeutic on coumadin and will require that for 3 months postop with a tissue mitral valve. She could then be switched to ASA. She does have a history of prior stroke that was felt to be embolic so she may benefit from staying on coumadin as long as she tolerates it. I told her she can return to driving but should not lift anything heavier than 10 lbs for 3 months postop.  Plan:  She will continue to follow up with Dr. Wyline Mood and Dr. Ouida Sills and will contact me if she develops any problems with her incisions.

## 2013-08-14 NOTE — Telephone Encounter (Signed)
Message copied by Nori Riis on Wed Aug 14, 2013  4:10 PM ------      Message from: Iantha F      Created: Wed Aug 14, 2013 12:56 PM       Please let patient know her anemia is improving. Her sodium and potassium are normal. She has a very mild increase in her liver enzymes, we will continue to follow this. This may have been due to the amiodarone that she is now off of.             Dina Rich MD ------

## 2013-08-14 NOTE — Telephone Encounter (Signed)
RX refills for Lasix 40 mg and Potassium 10 meq called to pharm

## 2013-08-15 ENCOUNTER — Ambulatory Visit (INDEPENDENT_AMBULATORY_CARE_PROVIDER_SITE_OTHER): Payer: Medicare Other | Admitting: *Deleted

## 2013-08-15 DIAGNOSIS — I639 Cerebral infarction, unspecified: Secondary | ICD-10-CM

## 2013-08-15 DIAGNOSIS — I6340 Cerebral infarction due to embolism of unspecified cerebral artery: Secondary | ICD-10-CM

## 2013-08-15 DIAGNOSIS — Z954 Presence of other heart-valve replacement: Secondary | ICD-10-CM

## 2013-08-15 DIAGNOSIS — Z7901 Long term (current) use of anticoagulants: Secondary | ICD-10-CM

## 2013-08-15 DIAGNOSIS — I4891 Unspecified atrial fibrillation: Secondary | ICD-10-CM

## 2013-08-15 LAB — POCT INR: INR: 2.1

## 2013-08-15 NOTE — Telephone Encounter (Signed)
This encounter was created in error - please disregard.

## 2013-08-15 NOTE — Telephone Encounter (Signed)
Pt informed

## 2013-08-22 ENCOUNTER — Encounter (HOSPITAL_COMMUNITY)
Admission: RE | Admit: 2013-08-22 | Discharge: 2013-08-22 | Disposition: A | Discharge status: Home / Self Care | Payer: Medicare Other | Source: Ambulatory Visit | Attending: Cardiology | Admitting: Cardiology

## 2013-08-22 ENCOUNTER — Ambulatory Visit (INDEPENDENT_AMBULATORY_CARE_PROVIDER_SITE_OTHER): Payer: Medicare Other | Admitting: *Deleted

## 2013-08-22 DIAGNOSIS — Z7901 Long term (current) use of anticoagulants: Secondary | ICD-10-CM

## 2013-08-22 DIAGNOSIS — I6350 Cerebral infarction due to unspecified occlusion or stenosis of unspecified cerebral artery: Secondary | ICD-10-CM

## 2013-08-22 DIAGNOSIS — I4892 Unspecified atrial flutter: Secondary | ICD-10-CM | POA: Insufficient documentation

## 2013-08-22 DIAGNOSIS — Z952 Presence of prosthetic heart valve: Secondary | ICD-10-CM | POA: Insufficient documentation

## 2013-08-22 DIAGNOSIS — I4891 Unspecified atrial fibrillation: Secondary | ICD-10-CM

## 2013-08-22 DIAGNOSIS — Z954 Presence of other heart-valve replacement: Secondary | ICD-10-CM

## 2013-08-22 DIAGNOSIS — Z5189 Encounter for other specified aftercare: Secondary | ICD-10-CM | POA: Insufficient documentation

## 2013-08-22 DIAGNOSIS — I6340 Cerebral infarction due to embolism of unspecified cerebral artery: Secondary | ICD-10-CM

## 2013-08-22 DIAGNOSIS — I639 Cerebral infarction, unspecified: Secondary | ICD-10-CM

## 2013-08-22 NOTE — Progress Notes (Signed)
Cardiac Rehab Medication Review by a Pharmacist  Does the patient  feel that his/her medications are working for him/her?  yes  Has the patient been experiencing any side effects to the medications prescribed?  no  Does the patient measure his/her own blood pressure or blood glucose at home?  yes   Does the patient have any problems obtaining medications due to transportation or finances?   no  Understanding of regimen: excellent Understanding of indications: excellent Potential of compliance: excellent    Pharmacist comments:  67 yo female who presents in good spirits with no medication list with her but was able to identify changes in her medication list very well. She has NKA and understands the indications of her medications. No reported side effects, records CBGs at home to be about 133 recently, which is higher than her normal. She says right after our meeting, she will have to go to [place] to get her coumadin checked. Appears to have a good potential for compliance.   Thanks for allowing me to take part in the care of this patient,  Britt Bottom B. Artelia Laroche, PharmD Clinical Pharmacist - Resident Pager: [phone] Phone: [phone] 08/22/2013 8:14 AM

## 2013-08-23 DIAGNOSIS — Z48812 Encounter for surgical aftercare following surgery on the circulatory system: Secondary | ICD-10-CM

## 2013-08-26 ENCOUNTER — Encounter (HOSPITAL_COMMUNITY)
Admission: RE | Admit: 2013-08-26 | Discharge: 2013-08-26 | Disposition: A | Discharge status: Home / Self Care | Payer: Medicare Other | Source: Ambulatory Visit | Attending: Cardiology | Admitting: Cardiology

## 2013-08-26 ENCOUNTER — Telehealth: Payer: Self-pay | Admitting: *Deleted

## 2013-08-26 ENCOUNTER — Encounter (HOSPITAL_COMMUNITY): Payer: Self-pay

## 2013-08-26 LAB — GLUCOSE, CAPILLARY
Glucose-Capillary: 135 mg/dL — ABNORMAL HIGH (ref 70–99)
Glucose-Capillary: 121 mg/dL — ABNORMAL HIGH (ref 70–99)

## 2013-08-26 NOTE — Telephone Encounter (Signed)
Thank you  for update

## 2013-08-26 NOTE — Telephone Encounter (Signed)
[id number] Rehab from [place] called stating that this was the pt first day of cardiac rehab. Nurse stated that the pt did really well. Pt did get SOB and oxygen went down to 87% on room air, but she was over exerting herself by working really hard. Everything else was fine.

## 2013-08-26 NOTE — Progress Notes (Addendum)
Pt started cardiac rehab today.  Pt tolerated light exercise without difficulty.  VSS, telemetry-Sinus rhythm, first degree AV block.  Pt became weak end of 3rd station (stationary bike).  Pt exhibited bilateral leg weakness and generalized weakness, associated with dyspnea. Pt seated in chair to avoid fall.   Lungs clear.    BP-144/64, CBG-121, HR-111, O2sat-87% on room air up to 98% with PLB breathing.  Pt symptoms resolved quickly with sitting and drinking water.  Stationary bike was awkward for pt to use, which led to overexertion.  Will make setting or equipment adjustments at next session to accommodate.  Pt able to walk without assistance and dyspnea resolved.  PC to Dr. Wyline Mood office, Jeanice Lim, Dr. Verna Czech nurse made aware.    Rehab report will be faxed.  Otherwise pt did well with exercise today. PHQ-0, no psychosocial barriers to rehab identified.   Pt oriented to exercise equipment and routine.  Understanding verbalized.

## 2013-08-28 ENCOUNTER — Encounter (HOSPITAL_COMMUNITY)
Admission: RE | Admit: 2013-08-28 | Discharge: 2013-08-28 | Disposition: A | Discharge status: Home / Self Care | Payer: Medicare Other | Source: Ambulatory Visit | Attending: Cardiology | Admitting: Cardiology

## 2013-08-28 LAB — GLUCOSE, CAPILLARY: Glucose-Capillary: 96 mg/dL (ref 70–99)

## 2013-08-28 NOTE — Progress Notes (Signed)
Pt exercised today at cardiac rehab without difficulty.  Pt was not able to comfortably use stationary bike, she was changed to recumbent bike which she tolerated well.  Pt did however need to take a rest break after 5 minutes.   Pt will transfer her cardiac rehab care to [facility] Cardiac Re[facility] program.  This is closer to her home and this program has class availability to accommodate pt.  Records faxed to [facility].

## 2013-08-30 ENCOUNTER — Encounter (HOSPITAL_COMMUNITY): Payer: Medicare Other

## 2013-09-02 ENCOUNTER — Encounter (HOSPITAL_COMMUNITY): Payer: Medicare Other

## 2013-09-02 ENCOUNTER — Ambulatory Visit (INDEPENDENT_AMBULATORY_CARE_PROVIDER_SITE_OTHER): Payer: Medicare Other | Admitting: *Deleted

## 2013-09-02 ENCOUNTER — Encounter (HOSPITAL_COMMUNITY)
Admission: RE | Admit: 2013-09-02 | Discharge: 2013-09-02 | Disposition: A | Discharge status: Home / Self Care | Payer: Medicare Other | Source: Ambulatory Visit | Attending: Cardiology | Admitting: Cardiology

## 2013-09-02 DIAGNOSIS — I6350 Cerebral infarction due to unspecified occlusion or stenosis of unspecified cerebral artery: Secondary | ICD-10-CM

## 2013-09-02 DIAGNOSIS — I6340 Cerebral infarction due to embolism of unspecified cerebral artery: Secondary | ICD-10-CM

## 2013-09-02 DIAGNOSIS — Z7901 Long term (current) use of anticoagulants: Secondary | ICD-10-CM

## 2013-09-02 DIAGNOSIS — Z954 Presence of other heart-valve replacement: Secondary | ICD-10-CM

## 2013-09-02 DIAGNOSIS — Z952 Presence of prosthetic heart valve: Secondary | ICD-10-CM | POA: Insufficient documentation

## 2013-09-02 DIAGNOSIS — I4891 Unspecified atrial fibrillation: Secondary | ICD-10-CM | POA: Insufficient documentation

## 2013-09-02 DIAGNOSIS — I639 Cerebral infarction, unspecified: Secondary | ICD-10-CM

## 2013-09-02 DIAGNOSIS — Z5189 Encounter for other specified aftercare: Secondary | ICD-10-CM | POA: Insufficient documentation

## 2013-09-02 DIAGNOSIS — I4892 Unspecified atrial flutter: Secondary | ICD-10-CM | POA: Insufficient documentation

## 2013-09-02 LAB — POCT INR: INR: 2.1

## 2013-09-04 ENCOUNTER — Encounter (HOSPITAL_COMMUNITY)
Admission: RE | Admit: 2013-09-04 | Discharge: 2013-09-04 | Disposition: A | Discharge status: Home / Self Care | Payer: Medicare Other | Source: Ambulatory Visit | Attending: Cardiology | Admitting: Cardiology

## 2013-09-04 ENCOUNTER — Encounter (HOSPITAL_COMMUNITY): Payer: Medicare Other

## 2013-09-05 ENCOUNTER — Encounter: Payer: Self-pay | Admitting: Cardiology

## 2013-09-05 ENCOUNTER — Ambulatory Visit (INDEPENDENT_AMBULATORY_CARE_PROVIDER_SITE_OTHER): Payer: Medicare Other | Admitting: Cardiology

## 2013-09-05 VITALS — BP 134/69 | HR 73 | Ht 62.0 in | Wt 166.8 lb

## 2013-09-05 DIAGNOSIS — I359 Nonrheumatic aortic valve disorder, unspecified: Secondary | ICD-10-CM

## 2013-09-05 DIAGNOSIS — I350 Nonrheumatic aortic (valve) stenosis: Secondary | ICD-10-CM

## 2013-09-05 DIAGNOSIS — I4891 Unspecified atrial fibrillation: Secondary | ICD-10-CM

## 2013-09-05 DIAGNOSIS — I050 Rheumatic mitral stenosis: Secondary | ICD-10-CM

## 2013-09-05 MED ORDER — NITROGLYCERIN 0.4 MG SL SUBL: 0.4000 mg | SUBLINGUAL_TABLET | SUBLINGUAL | Status: DC | PRN

## 2013-09-05 MED ORDER — FLUTICASONE PROPIONATE 50 MCG/ACT NA SUSP: 1.0000 | Freq: Every day | NASAL | Status: DC

## 2013-09-05 NOTE — Patient Instructions (Addendum)
Your physician recommends that you schedule a follow-up appointment in: 3 months   Your physician recommends that you continue on your current medications as directed. Please refer to the Current Medication list given to you today.  Use Flonase as directed

## 2013-09-05 NOTE — Progress Notes (Signed)
Clinical Summary [name] is a 67 y.o.female seen today for follow up of the following medical problems.    1. Valvular heart disease  - patient originally presented 07/06/13 to [facility] with shortness of breath and DOE. Found to be in pulmonary edema, TTE showed severe mitral stenosis and possible significant aortic valve stenosis as well  - patient diuresed with improved symptoms, transferred to [facility] for evaluation for possible valve replacement  - prior to surgery, had cath that showed patent coronaries. TEE to better evaluate valves.  - 07/18/13 patient underwent MVR with 25 mm Edwards Magna-ease pericardial valve and also AVR with 21 mm Edwards Magna-ease pericardial valve  - postop complicated by afib with RVR, started on amiodarone and diltiazem, underwent unsuccesful DCCV. She eventually converted with amiodarone. She also developed a right pleural effusion which was drained 07/30/13 with 1.5 liters of fluid removed. She was discharged on 07/24/13  - reports breathing is doing well. Walks around at home without significant limitations.    - denies any significant SOB. Activity picking up at rehab. No orthopnea, occas LE edema. Compliant with meds    2. Afib  - denies any palpitatoins - denies any issues with bleeding - compliant w/ dilitiazem and metoprolol  - presumed allergy to amiodarone, medication was discontinued and rash resolved.    3. Cough - started one week ago. +Nasal congestion x 2 weeks. No fevers or chills. Can occur any time of day, worst at night.       Past Medical History  Diagnosis Date  . Rheumatic heart disease     a. 07/2013 Echo: Ef 55-60%, Mod AS/AI, Mod-Sev MS, sev dil LA, PASP 58;  b. 07/2013 TEE: EF 35-40%, mild-mod AS/AI, mod-sev MS, LA smoke;  c. 07/2013 Cath: elev R heart pressures, nl cors.  . Gastroesophageal reflux disease   . Hyperlipidemia   . Cardioembolic stroke 01/2012    Right frontal in 01/2012; normal carotid  ultrasound; possible LAA thrombus by TEE; virtual complete neurologic recovery  . Fasting hyperglycemia     120 fasting  . Chronic kidney disease, stage 2, mildly decreased GFR     GFR of approximately 60  . Breast carcinoma 1995    1995  . Depression   . Diverticulosis of colon (without mention of hemorrhage) 2012    Dr. Karilyn Cota  . Gastroparesis   . Hemorrhoids   . Anemia   . Shortness of breath   . Hypertension     pt denies 05/30/13     Dr Hinton Rao  . Atrial fibrillation   . Hyponatremia      No Known Allergies   Current Outpatient Prescriptions  Medication Sig Dispense Refill  . albuterol (VENTOLIN HFA) 108 (90 BASE) MCG/ACT inhaler Inhale 2 puffs into the lungs every 4 (four) hours as needed for wheezing.      Marland Kitchen atorvastatin (LIPITOR) 20 MG tablet Take 20 mg by mouth daily.      . beclomethasone (QVAR) 80 MCG/ACT inhaler Inhale 2 puffs into the lungs 2 (two) times daily.  1 Inhaler  6  . buPROPion (WELLBUTRIN XL) 150 MG 24 hr tablet Take 150 mg by mouth daily.        Marland Kitchen dexlansoprazole (DEXILANT) 60 MG capsule Take 1 capsule (60 mg total) by mouth daily.  90 capsule  3  . diltiazem (CARDIZEM CD) 180 MG 24 hr capsule Take 1 capsule (180 mg total) by mouth daily.  30 capsule  1  . furosemide (LASIX) 40 MG tablet Take 1 tablet (40 mg total) by mouth daily.  30 tablet  0  . hydrOXYzine (ATARAX/VISTARIL) 25 MG tablet Take 1 tablet (25 mg total) by mouth every 8 (eight) hours as needed.  30 tablet  0  . levothyroxine (SYNTHROID, LEVOTHROID) 150 MCG tablet Take 1 tablet (150 mcg total) by mouth daily before breakfast.  30 tablet  1  . metFORMIN (GLUCOPHAGE) 500 MG tablet Take 1 tablet (500 mg total) by mouth 2 (two) times daily with a meal.  60 tablet  1  . metoprolol tartrate (LOPRESSOR) 25 MG tablet Take 1 tablet (25 mg total) by mouth 2 (two) times daily.  60 tablet  6  . Multiple Vitamin (MULITIVITAMIN WITH MINERALS) TABS Take 1 tablet by mouth daily.      . potassium  chloride SA (K-DUR,KLOR-CON) 20 MEQ tablet Take 1 tablet (20 mEq total) by mouth daily.  30 tablet  0  . saccharomyces boulardii (FLORASTOR) 250 MG capsule Take 250 mg by mouth daily.      Marland Kitchen warfarin (COUMADIN) 2.5 MG tablet Take 1 tablet (2.5 mg total) by mouth daily. Or as directed by [facility]  40 tablet  1   No current facility-administered medications for this visit.     Past Surgical History  Procedure Laterality Date  . Mitral valve surgery  [facility], closed mitral valvulotomy by finger fracture  . Cardiac catheterization    . Dilation and curettage of uterus    . Breast lumpectomy Right 1995  . Tubal ligation  1973  . Tee without cardioversion  01/24/2012    Procedure: TRANSESOPHAGEAL ECHOCARDIOGRAM (TEE);  Surgeon: Lewayne Bunting, MD;  Location: [facility] ENDOSCOPY;  Service: Cardiovascular;  Laterality: N/A;  . Colonoscopy  2012    Negative screening procedure  . Esophageal manometry N/A 06/17/2013    Procedure: ESOPHAGEAL MANOMETRY (EM);  Surgeon: Mardella Layman, MD;  Location: [facility] ENDOSCOPY;  Service: Endoscopy;  Laterality: N/A;  . Tee without cardioversion N/A 07/11/2013    Procedure: TRANSESOPHAGEAL ECHOCARDIOGRAM (TEE);  Surgeon: Vesta Mixer, MD;  Location: [facility] ENDOSCOPY;  Service: Cardiovascular;  Laterality: N/A;  . Aortic valve replacement N/A 07/18/2013    Procedure: AORTIC VALVE REPLACEMENT (AVR);  Surgeon: [name], MD;  Location: [facility] OR;  Service: Open Heart Surgery;  Laterality: N/A;  . Intraoperative transesophageal echocardiogram N/A 07/18/2013    Procedure: INTRAOPERATIVE TRANSESOPHAGEAL ECHOCARDIOGRAM;  Surgeon: [name], MD;  Location: [facility] OR;  Service: Open Heart Surgery;  Laterality: N/A;  . Mitral valve replacement N/A 07/18/2013    Procedure: MITRAL VALVE (MV) REPLACEMENT;  Surgeon: [name], MD;  Location: [facility] OR;  Service: Open Heart Surgery;  Laterality: N/A;  . Tee without cardioversion N/A 07/26/2013    Procedure:  TRANSESOPHAGEAL ECHOCARDIOGRAM (TEE);  Surgeon: Pricilla Riffle, MD;  Location: [facility] ENDOSCOPY;  Service: Cardiovascular;  Laterality: N/A;  . Cardioversion N/A 07/26/2013    Procedure: CARDIOVERSION;  Surgeon: Pricilla Riffle, MD;  Location: [facility] ENDOSCOPY;  Service: Cardiovascular;  Laterality: N/A;     No Known Allergies    Family History  Problem Relation Age of Onset  . Heart disease Brother 50    MI  . Rheum arthritis Maternal Grandmother   . Asthma Maternal Grandfather   . Hypothyroidism Mother   . Diabetes Sister   . Cirrhosis Father   . Cervical cancer Sister     malignant neoplasm of cervix uteri  Social History Ms. Bilger reports that she has never smoked. She has never used smokeless tobacco. Ms. Krajewski reports that she does not drink alcohol.   Review of Systems CONSTITUTIONAL: No weight loss, fever, chills, weakness or fatigue.  HEENT: Eyes: No visual loss, blurred vision, double vision or yellow sclerae.No hearing loss, sneezing, congestion, runny nose or sore throat.  SKIN: No rash or itching.  CARDIOVASCULAR: per HPI RESPIRATORY: per HPI GASTROINTESTINAL: No anorexia, nausea, vomiting or diarrhea. No abdominal pain or blood.  GENITOURINARY: No burning on urination, no polyuria NEUROLOGICAL: No headache, dizziness, syncope, paralysis, ataxia, numbness or tingling in the extremities. No change in bowel or bladder control.  MUSCULOSKELETAL: No muscle, back pain, joint pain or stiffness.  LYMPHATICS: No enlarged nodes. No history of splenectomy.  PSYCHIATRIC: No history of depression or anxiety.  ENDOCRINOLOGIC: No reports of sweating, cold or heat intolerance. No polyuria or polydipsia.  Marland Kitchen   Physical Examination p 73 bp 134/69 Gen: resting comfortably, no acute distress HEENT: no scleral icterus, pupils equal round and reactive, no palptable cervical adenopathy,  CV: RRR, 2/6 systolic murmur RUSB and at apex, no JVD, no carotid bruits Resp: Clear to  auscultation bilaterally GI: abdomen is soft, non-tender, non-distended, normal bowel sounds, no hepatosplenomegaly MSK: extremities are warm, trace bilateral edema Skin: warm, no rash Neuro:  no focal deficits Psych: appropriate affect   Diagnostic Studies 07/06/13 TEE  Left ventricle: Systolic function was moderately reduced. The estimated ejection fraction was in the range of 35% to 40%. - Aortic valve: There was mild to moderate stenosis. Mild to moderate regurgitation. - Mitral valve: The findings are consistent with moderate to severe stenosis. Valve area by pressure half-time: 1.45cm^2. - Left atrium: There was spontaneous echo contrast ("smoke"). - Atrial septum: No defect or patent foramen ovale was identified. - Tricuspid valve: No evidence of vegetation.   07/26/13 TEE  Study Conclusions  Left atrium: No thrombus in LA. Patient is s/p ligation of appendage. No flow in this region. Left ventricle: LV is foreshortened but systolic function appears grossly normal.  ------------------------------------------------------------ Aortic valve: AV prosthesis opens well. No AI.  ------------------------------------------------------------ Aorta: Mild fixed plaque in the thoracic aorta  ------------------------------------------------------------ Mitral valve: MV prosthesis moves wellTrace MR. Doppler: Peak gradient: 20mm Hg (D).  07/10/13 Cath  Hemodynamic Findings:  Ao: 132/57  LV: 144/2/11  RA: 10  RV: 63/2/10  PA: 62/19 (mean 41)  PCWP: 19  Fick Cardiac Output: 3.73 L/min  Fick Cardiac Index: 2.11 L/min/m2  Central Aortic Saturation: 94%  Pulmonary Artery Saturation: 58%  Aortic valve: Peak to peak gradient 12 mm Hg. Valve easily crossed with J-wire.  Angiographic Findings:  Left main: No obstructive disease.  Left Anterior Descending Artery: Large caliber vessel that courses to the apex. The mid and distal vessel becomes small in caliber. Small caliber  diagonal Peggy Loge. No obstructive disease.  Circumflex Artery: Large caliber vessel with two small obtuse marginal branches. No obstructive disease.  Right Coronary Artery: Large, dominant vessel with no obstructive disease.  Left Ventricular Angiogram: Deferred.  Impression:  1. No angiographic evidence of CAD  2. Elevated filling pressures    Pertinent labs  12/114 INR: 2.1 08/08/13: Na 138 K 4.7 BUN 8 Cr 0.89      Assessment and Plan  1. Valvular heart disease  - history of mitral and aortic stenosis, s/p tissue valve replacement of both valves  - doing well from a symptoms standpoint, last imaging 07/26/13 TEE describes normal functioning prosthetic valves  -  she is participating and progressing with cardiac rehab - she does have occasional LE edema, counseled on those days she can take her lasix bid  2. Afib  - unclear how long she has had afib, was diagnosed postoperatively but she does have a history of severe mitral stenosis, left atrial enlargement, and prior cardioembolic stroke. I suspect likely was ongoing before her recent surgery. - denies any significant symptoms, she is rate controlled today in clinic.  - she developed a rash presumed to be due to amiodarone, after stopping the medication it resolved.  - continue current meds  F/u 3 months    Antoine Poche, M.D., F.A.C.C.

## 2013-09-06 ENCOUNTER — Encounter (HOSPITAL_COMMUNITY)
Admission: RE | Admit: 2013-09-06 | Discharge: 2013-09-06 | Disposition: A | Discharge status: Home / Self Care | Payer: Medicare Other | Source: Ambulatory Visit | Attending: Cardiology | Admitting: Cardiology

## 2013-09-06 ENCOUNTER — Encounter (HOSPITAL_COMMUNITY): Payer: Medicare Other

## 2013-09-09 ENCOUNTER — Encounter (HOSPITAL_COMMUNITY)
Admission: RE | Admit: 2013-09-09 | Discharge: 2013-09-09 | Disposition: A | Discharge status: Home / Self Care | Payer: Medicare Other | Source: Ambulatory Visit | Attending: Cardiology | Admitting: Cardiology

## 2013-09-09 ENCOUNTER — Encounter (HOSPITAL_COMMUNITY): Payer: Medicare Other

## 2013-09-11 ENCOUNTER — Encounter (HOSPITAL_COMMUNITY): Payer: Medicare Other

## 2013-09-11 ENCOUNTER — Encounter (HOSPITAL_COMMUNITY)
Admission: RE | Admit: 2013-09-11 | Discharge: 2013-09-11 | Disposition: A | Discharge status: Home / Self Care | Payer: Medicare Other | Source: Ambulatory Visit | Attending: Cardiology | Admitting: Cardiology

## 2013-09-13 ENCOUNTER — Encounter (HOSPITAL_COMMUNITY): Payer: Medicare Other

## 2013-09-14 ENCOUNTER — Other Ambulatory Visit: Payer: Self-pay | Admitting: Surgery

## 2013-09-16 ENCOUNTER — Ambulatory Visit (INDEPENDENT_AMBULATORY_CARE_PROVIDER_SITE_OTHER): Payer: Medicare Other | Admitting: *Deleted

## 2013-09-16 ENCOUNTER — Encounter (HOSPITAL_COMMUNITY)
Admission: RE | Admit: 2013-09-16 | Discharge: 2013-09-16 | Disposition: A | Discharge status: Home / Self Care | Payer: Medicare Other | Source: Ambulatory Visit | Attending: Cardiology | Admitting: Cardiology

## 2013-09-16 ENCOUNTER — Encounter (HOSPITAL_COMMUNITY): Payer: Medicare Other

## 2013-09-16 ENCOUNTER — Telehealth: Payer: Self-pay | Admitting: *Deleted

## 2013-09-16 DIAGNOSIS — I4891 Unspecified atrial fibrillation: Secondary | ICD-10-CM

## 2013-09-16 DIAGNOSIS — Z7901 Long term (current) use of anticoagulants: Secondary | ICD-10-CM

## 2013-09-16 DIAGNOSIS — I6340 Cerebral infarction due to embolism of unspecified cerebral artery: Secondary | ICD-10-CM

## 2013-09-16 DIAGNOSIS — I639 Cerebral infarction, unspecified: Secondary | ICD-10-CM

## 2013-09-16 DIAGNOSIS — Z954 Presence of other heart-valve replacement: Secondary | ICD-10-CM

## 2013-09-16 DIAGNOSIS — I6350 Cerebral infarction due to unspecified occlusion or stenosis of unspecified cerebral artery: Secondary | ICD-10-CM

## 2013-09-16 NOTE — Telephone Encounter (Signed)
Pt came into office with a written note to give to Dr Wyline Mood to advise the Flonase he prescribed her recently did not help and the pt requested to have an RX filled for the following medication per notes this has helped in the past:  120 Hydromet 1.5-5mg  to [facility] pharmacy  Dr Wyline Mood gave verbal instructions to decline this medication to be prescribed by him, she will need to have this prescribed by her PCP noted as hydrocodone and non cardiac related medication, this nurse contacted pt to advise, pt understood and will contact her PCP

## 2013-09-18 ENCOUNTER — Encounter (HOSPITAL_COMMUNITY)
Admission: RE | Admit: 2013-09-18 | Discharge: 2013-09-18 | Disposition: A | Discharge status: Home / Self Care | Payer: Medicare Other | Source: Ambulatory Visit | Attending: Cardiology | Admitting: Cardiology

## 2013-09-18 ENCOUNTER — Encounter (HOSPITAL_COMMUNITY): Payer: Medicare Other

## 2013-09-20 ENCOUNTER — Encounter (HOSPITAL_COMMUNITY): Payer: Medicare Other

## 2013-09-20 ENCOUNTER — Encounter (HOSPITAL_COMMUNITY)
Admission: RE | Admit: 2013-09-20 | Discharge: 2013-09-20 | Disposition: A | Discharge status: Home / Self Care | Payer: Medicare Other | Source: Ambulatory Visit | Attending: Cardiology | Admitting: Cardiology

## 2013-09-21 ENCOUNTER — Other Ambulatory Visit: Payer: Self-pay | Admitting: Surgery

## 2013-09-23 ENCOUNTER — Encounter (HOSPITAL_COMMUNITY): Payer: Medicare Other

## 2013-09-23 ENCOUNTER — Encounter (HOSPITAL_COMMUNITY)
Admission: RE | Admit: 2013-09-23 | Discharge: 2013-09-23 | Disposition: A | Discharge status: Home / Self Care | Payer: Medicare Other | Source: Ambulatory Visit | Attending: Cardiology | Admitting: Cardiology

## 2013-09-25 ENCOUNTER — Encounter (HOSPITAL_COMMUNITY): Payer: Medicare Other

## 2013-09-25 ENCOUNTER — Encounter (HOSPITAL_COMMUNITY)
Admission: RE | Admit: 2013-09-25 | Discharge: 2013-09-25 | Disposition: A | Discharge status: Home / Self Care | Payer: Medicare Other | Source: Ambulatory Visit | Attending: Cardiology | Admitting: Cardiology

## 2013-09-27 ENCOUNTER — Encounter (HOSPITAL_COMMUNITY)
Admission: RE | Admit: 2013-09-27 | Discharge: 2013-09-27 | Disposition: A | Discharge status: Home / Self Care | Payer: Medicare Other | Source: Ambulatory Visit | Attending: Cardiology | Admitting: Cardiology

## 2013-09-27 ENCOUNTER — Encounter (HOSPITAL_COMMUNITY): Payer: Medicare Other

## 2013-09-30 ENCOUNTER — Encounter (HOSPITAL_COMMUNITY)
Admission: RE | Admit: 2013-09-30 | Discharge: 2013-09-30 | Disposition: A | Discharge status: Home / Self Care | Payer: Medicare Other | Source: Ambulatory Visit | Attending: Cardiology | Admitting: Cardiology

## 2013-09-30 ENCOUNTER — Encounter (HOSPITAL_COMMUNITY): Payer: Medicare Other

## 2013-10-02 ENCOUNTER — Encounter (HOSPITAL_COMMUNITY): Payer: Medicare Other

## 2013-10-02 ENCOUNTER — Encounter (HOSPITAL_COMMUNITY)
Admission: RE | Admit: 2013-10-02 | Discharge: 2013-10-02 | Disposition: A | Discharge status: Home / Self Care | Payer: Medicare Other | Source: Ambulatory Visit | Attending: Cardiology | Admitting: Cardiology

## 2013-10-04 ENCOUNTER — Encounter (HOSPITAL_COMMUNITY): Payer: Medicare Other

## 2013-10-04 ENCOUNTER — Encounter (HOSPITAL_COMMUNITY)
Admission: RE | Admit: 2013-10-04 | Discharge: 2013-10-04 | Disposition: A | Discharge status: Home / Self Care | Payer: Medicare Other | Source: Ambulatory Visit | Attending: Cardiology | Admitting: Cardiology

## 2013-10-04 DIAGNOSIS — Z952 Presence of prosthetic heart valve: Secondary | ICD-10-CM | POA: Insufficient documentation

## 2013-10-04 DIAGNOSIS — I4892 Unspecified atrial flutter: Secondary | ICD-10-CM | POA: Insufficient documentation

## 2013-10-04 DIAGNOSIS — Z5189 Encounter for other specified aftercare: Secondary | ICD-10-CM | POA: Insufficient documentation

## 2013-10-04 DIAGNOSIS — I4891 Unspecified atrial fibrillation: Secondary | ICD-10-CM | POA: Insufficient documentation

## 2013-10-07 ENCOUNTER — Encounter (HOSPITAL_COMMUNITY)
Admission: RE | Admit: 2013-10-07 | Discharge: 2013-10-07 | Disposition: A | Discharge status: Home / Self Care | Payer: Medicare Other | Source: Ambulatory Visit | Attending: Cardiology | Admitting: Cardiology

## 2013-10-07 ENCOUNTER — Encounter (HOSPITAL_COMMUNITY): Payer: Medicare Other

## 2013-10-07 ENCOUNTER — Ambulatory Visit (INDEPENDENT_AMBULATORY_CARE_PROVIDER_SITE_OTHER): Payer: Medicare Other | Admitting: *Deleted

## 2013-10-07 DIAGNOSIS — I6340 Cerebral infarction due to embolism of unspecified cerebral artery: Secondary | ICD-10-CM

## 2013-10-07 DIAGNOSIS — I639 Cerebral infarction, unspecified: Secondary | ICD-10-CM

## 2013-10-07 DIAGNOSIS — I4891 Unspecified atrial fibrillation: Secondary | ICD-10-CM

## 2013-10-07 DIAGNOSIS — Z7901 Long term (current) use of anticoagulants: Secondary | ICD-10-CM

## 2013-10-07 DIAGNOSIS — I6350 Cerebral infarction due to unspecified occlusion or stenosis of unspecified cerebral artery: Secondary | ICD-10-CM

## 2013-10-07 DIAGNOSIS — Z954 Presence of other heart-valve replacement: Secondary | ICD-10-CM

## 2013-10-07 LAB — POCT INR: INR: 3.1

## 2013-10-09 ENCOUNTER — Encounter (HOSPITAL_COMMUNITY): Payer: Medicare Other

## 2013-10-09 ENCOUNTER — Encounter (HOSPITAL_COMMUNITY)
Admission: RE | Admit: 2013-10-09 | Discharge: 2013-10-09 | Disposition: A | Discharge status: Home / Self Care | Payer: Medicare Other | Source: Ambulatory Visit | Attending: Cardiology | Admitting: Cardiology

## 2013-10-11 ENCOUNTER — Encounter (HOSPITAL_COMMUNITY)
Admission: RE | Admit: 2013-10-11 | Discharge: 2013-10-11 | Disposition: A | Discharge status: Home / Self Care | Payer: Medicare Other | Source: Ambulatory Visit | Attending: Cardiology | Admitting: Cardiology

## 2013-10-11 ENCOUNTER — Encounter (HOSPITAL_COMMUNITY): Payer: Medicare Other

## 2013-10-14 ENCOUNTER — Encounter (HOSPITAL_COMMUNITY): Payer: Medicare Other

## 2013-10-14 ENCOUNTER — Encounter (HOSPITAL_COMMUNITY)
Admission: RE | Admit: 2013-10-14 | Discharge: 2013-10-14 | Disposition: A | Discharge status: Home / Self Care | Payer: Medicare Other | Source: Ambulatory Visit | Attending: Cardiology | Admitting: Cardiology

## 2013-10-16 ENCOUNTER — Encounter (HOSPITAL_COMMUNITY): Payer: Medicare Other

## 2013-10-16 ENCOUNTER — Encounter (HOSPITAL_COMMUNITY)
Admission: RE | Admit: 2013-10-16 | Discharge: 2013-10-16 | Disposition: A | Discharge status: Home / Self Care | Payer: Medicare Other | Source: Ambulatory Visit | Attending: Cardiology | Admitting: Cardiology

## 2013-10-18 ENCOUNTER — Encounter (HOSPITAL_COMMUNITY)
Admission: RE | Admit: 2013-10-18 | Discharge: 2013-10-18 | Disposition: A | Discharge status: Home / Self Care | Payer: Medicare Other | Source: Ambulatory Visit | Attending: Cardiology | Admitting: Cardiology

## 2013-10-18 ENCOUNTER — Telehealth: Payer: Self-pay | Admitting: *Deleted

## 2013-10-18 ENCOUNTER — Encounter (HOSPITAL_COMMUNITY): Payer: Medicare Other

## 2013-10-18 NOTE — Telephone Encounter (Signed)
Pt was started on Ceftin 250mg  bid x 10 days and Prednisone taper (40mg  x 3, 30mg  x 3, 20mg  x 3 10mg  x 3)Wants to know what to do about coumadin.  Told husband to decrease  pts coumadin to 1 tablet daily except 2 tablets on Monday and recheck INR on 10/23/13.  Spose verbalized understanding.

## 2013-10-21 ENCOUNTER — Encounter (HOSPITAL_COMMUNITY): Payer: Medicare Other

## 2013-10-21 ENCOUNTER — Encounter (HOSPITAL_COMMUNITY)
Admission: RE | Admit: 2013-10-21 | Discharge: 2013-10-21 | Disposition: A | Discharge status: Home / Self Care | Payer: Medicare Other | Source: Ambulatory Visit | Attending: Cardiology | Admitting: Cardiology

## 2013-10-23 ENCOUNTER — Ambulatory Visit (INDEPENDENT_AMBULATORY_CARE_PROVIDER_SITE_OTHER): Payer: Medicare Other | Admitting: *Deleted

## 2013-10-23 ENCOUNTER — Encounter (HOSPITAL_COMMUNITY): Payer: Medicare Other

## 2013-10-23 ENCOUNTER — Encounter (HOSPITAL_COMMUNITY)
Admission: RE | Admit: 2013-10-23 | Discharge: 2013-10-23 | Disposition: A | Discharge status: Home / Self Care | Payer: Medicare Other | Source: Ambulatory Visit | Attending: Cardiology | Admitting: Cardiology

## 2013-10-23 DIAGNOSIS — I6350 Cerebral infarction due to unspecified occlusion or stenosis of unspecified cerebral artery: Secondary | ICD-10-CM

## 2013-10-23 DIAGNOSIS — I639 Cerebral infarction, unspecified: Secondary | ICD-10-CM

## 2013-10-23 DIAGNOSIS — I4891 Unspecified atrial fibrillation: Secondary | ICD-10-CM

## 2013-10-23 DIAGNOSIS — I6340 Cerebral infarction due to embolism of unspecified cerebral artery: Secondary | ICD-10-CM

## 2013-10-23 DIAGNOSIS — Z954 Presence of other heart-valve replacement: Secondary | ICD-10-CM

## 2013-10-23 DIAGNOSIS — Z7901 Long term (current) use of anticoagulants: Secondary | ICD-10-CM

## 2013-10-23 LAB — POCT INR: INR: 2.1

## 2013-10-25 ENCOUNTER — Encounter (HOSPITAL_COMMUNITY)
Admission: RE | Admit: 2013-10-25 | Discharge: 2013-10-25 | Disposition: A | Discharge status: Home / Self Care | Payer: Medicare Other | Source: Ambulatory Visit | Attending: Cardiology | Admitting: Cardiology

## 2013-10-25 ENCOUNTER — Encounter (HOSPITAL_COMMUNITY): Payer: Medicare Other

## 2013-10-28 ENCOUNTER — Encounter (HOSPITAL_COMMUNITY): Payer: Medicare Other

## 2013-10-28 ENCOUNTER — Encounter (HOSPITAL_COMMUNITY)
Admission: RE | Admit: 2013-10-28 | Discharge: 2013-10-28 | Disposition: A | Discharge status: Home / Self Care | Payer: Medicare Other | Source: Ambulatory Visit | Attending: Cardiology | Admitting: Cardiology

## 2013-10-30 ENCOUNTER — Encounter (HOSPITAL_COMMUNITY): Payer: Medicare Other

## 2013-10-30 ENCOUNTER — Encounter (HOSPITAL_COMMUNITY)
Admission: RE | Admit: 2013-10-30 | Discharge: 2013-10-30 | Disposition: A | Discharge status: Home / Self Care | Payer: Medicare Other | Source: Ambulatory Visit | Attending: Cardiology | Admitting: Cardiology

## 2013-11-01 ENCOUNTER — Encounter (HOSPITAL_COMMUNITY)
Admission: RE | Admit: 2013-11-01 | Discharge: 2013-11-01 | Disposition: A | Discharge status: Home / Self Care | Payer: Medicare Other | Source: Ambulatory Visit | Attending: Cardiology | Admitting: Cardiology

## 2013-11-01 ENCOUNTER — Encounter (HOSPITAL_COMMUNITY): Payer: Medicare Other

## 2013-11-04 ENCOUNTER — Encounter (HOSPITAL_COMMUNITY): Payer: Medicare Other

## 2013-11-04 ENCOUNTER — Encounter (HOSPITAL_COMMUNITY)
Admission: RE | Admit: 2013-11-04 | Discharge: 2013-11-04 | Disposition: A | Discharge status: Home / Self Care | Payer: Medicare Other | Source: Ambulatory Visit | Attending: Cardiology | Admitting: Cardiology

## 2013-11-04 DIAGNOSIS — I4892 Unspecified atrial flutter: Secondary | ICD-10-CM | POA: Insufficient documentation

## 2013-11-04 DIAGNOSIS — I4891 Unspecified atrial fibrillation: Secondary | ICD-10-CM | POA: Insufficient documentation

## 2013-11-04 DIAGNOSIS — Z5189 Encounter for other specified aftercare: Secondary | ICD-10-CM | POA: Insufficient documentation

## 2013-11-04 DIAGNOSIS — Z952 Presence of prosthetic heart valve: Secondary | ICD-10-CM | POA: Insufficient documentation

## 2013-11-06 ENCOUNTER — Ambulatory Visit (INDEPENDENT_AMBULATORY_CARE_PROVIDER_SITE_OTHER): Payer: Medicare Other | Admitting: *Deleted

## 2013-11-06 ENCOUNTER — Encounter (HOSPITAL_COMMUNITY): Payer: Medicare Other

## 2013-11-06 ENCOUNTER — Encounter (HOSPITAL_COMMUNITY)
Admission: RE | Admit: 2013-11-06 | Discharge: 2013-11-06 | Disposition: A | Discharge status: Home / Self Care | Payer: Medicare Other | Source: Ambulatory Visit | Attending: Cardiology | Admitting: Cardiology

## 2013-11-06 DIAGNOSIS — I4891 Unspecified atrial fibrillation: Secondary | ICD-10-CM

## 2013-11-06 DIAGNOSIS — I6350 Cerebral infarction due to unspecified occlusion or stenosis of unspecified cerebral artery: Secondary | ICD-10-CM

## 2013-11-06 DIAGNOSIS — Z954 Presence of other heart-valve replacement: Secondary | ICD-10-CM

## 2013-11-06 DIAGNOSIS — Z5181 Encounter for therapeutic drug level monitoring: Secondary | ICD-10-CM | POA: Insufficient documentation

## 2013-11-06 DIAGNOSIS — Z7901 Long term (current) use of anticoagulants: Secondary | ICD-10-CM

## 2013-11-06 DIAGNOSIS — I639 Cerebral infarction, unspecified: Secondary | ICD-10-CM

## 2013-11-06 DIAGNOSIS — I6340 Cerebral infarction due to embolism of unspecified cerebral artery: Secondary | ICD-10-CM

## 2013-11-06 LAB — POCT INR: INR: 4

## 2013-11-08 ENCOUNTER — Encounter (HOSPITAL_COMMUNITY)
Admission: RE | Admit: 2013-11-08 | Discharge: 2013-11-08 | Disposition: A | Discharge status: Home / Self Care | Payer: Medicare Other | Source: Ambulatory Visit | Attending: Cardiology | Admitting: Cardiology

## 2013-11-08 ENCOUNTER — Encounter (HOSPITAL_COMMUNITY): Payer: Medicare Other

## 2013-11-11 ENCOUNTER — Encounter (HOSPITAL_COMMUNITY): Payer: Medicare Other

## 2013-11-11 ENCOUNTER — Encounter (HOSPITAL_COMMUNITY)
Admission: RE | Admit: 2013-11-11 | Discharge: 2013-11-11 | Disposition: A | Discharge status: Home / Self Care | Payer: Medicare Other | Source: Ambulatory Visit | Attending: Cardiology | Admitting: Cardiology

## 2013-11-13 ENCOUNTER — Encounter (HOSPITAL_COMMUNITY): Payer: Medicare Other

## 2013-11-13 ENCOUNTER — Encounter (HOSPITAL_COMMUNITY)
Admission: RE | Admit: 2013-11-13 | Discharge: 2013-11-13 | Disposition: A | Discharge status: Home / Self Care | Payer: Medicare Other | Source: Ambulatory Visit | Attending: Cardiology | Admitting: Cardiology

## 2013-11-15 ENCOUNTER — Encounter (HOSPITAL_COMMUNITY): Payer: Medicare Other

## 2013-11-15 ENCOUNTER — Other Ambulatory Visit (HOSPITAL_COMMUNITY): Payer: Self-pay | Admitting: Internal Medicine

## 2013-11-15 ENCOUNTER — Encounter (HOSPITAL_COMMUNITY)
Admission: RE | Admit: 2013-11-15 | Discharge: 2013-11-15 | Disposition: A | Discharge status: Home / Self Care | Payer: Medicare Other | Source: Ambulatory Visit | Attending: Cardiology | Admitting: Cardiology

## 2013-11-15 DIAGNOSIS — Z1231 Encounter for screening mammogram for malignant neoplasm of breast: Secondary | ICD-10-CM

## 2013-11-18 ENCOUNTER — Ambulatory Visit (INDEPENDENT_AMBULATORY_CARE_PROVIDER_SITE_OTHER): Payer: Medicare Other | Admitting: *Deleted

## 2013-11-18 ENCOUNTER — Encounter (HOSPITAL_COMMUNITY)
Admission: RE | Admit: 2013-11-18 | Discharge: 2013-11-18 | Disposition: A | Discharge status: Home / Self Care | Payer: Medicare Other | Source: Ambulatory Visit | Attending: Cardiology | Admitting: Cardiology

## 2013-11-18 ENCOUNTER — Encounter (HOSPITAL_COMMUNITY): Payer: Medicare Other

## 2013-11-18 DIAGNOSIS — I6350 Cerebral infarction due to unspecified occlusion or stenosis of unspecified cerebral artery: Secondary | ICD-10-CM

## 2013-11-18 DIAGNOSIS — Z7901 Long term (current) use of anticoagulants: Secondary | ICD-10-CM

## 2013-11-18 DIAGNOSIS — Z5181 Encounter for therapeutic drug level monitoring: Secondary | ICD-10-CM

## 2013-11-18 DIAGNOSIS — I6340 Cerebral infarction due to embolism of unspecified cerebral artery: Secondary | ICD-10-CM

## 2013-11-18 DIAGNOSIS — I639 Cerebral infarction, unspecified: Secondary | ICD-10-CM

## 2013-11-18 DIAGNOSIS — Z954 Presence of other heart-valve replacement: Secondary | ICD-10-CM

## 2013-11-18 DIAGNOSIS — I4891 Unspecified atrial fibrillation: Secondary | ICD-10-CM

## 2013-11-18 LAB — POCT INR: INR: 2.6

## 2013-11-18 NOTE — Addendum Note (Signed)
Encounter addended by: Norlene Duel, RN on: 11/18/2013  2:03 PM<BR>     Documentation filed: Notes Section

## 2013-11-18 NOTE — Addendum Note (Signed)
Encounter addended by: Norlene Duel, RN on: 11/18/2013  7:20 AM<BR>     Documentation filed: Notes Section

## 2013-11-18 NOTE — Progress Notes (Signed)
Cardiac Rehabilitation Program Outcomes Report   Orientation:  08/26/2013 Halfway report: 10/09/2013 Graduate Date:  tbd Discharge Date:  tbd # of sessions completed: 18 DX: valve repair  Cardiologist: Jefm Bryant MD:  Homero Fellers Time:  08:15  A.  Exercise Program:  Tolerates exercise @ 2.69 METS for 15 minutes  B.  Mental Health:  Good mental attitude  C.  Education/Instruction/Skills  Accurately checks own pulse.  Rest:  82  Exercise:  94, Knows THR for exercise and Uses Perceived Exertion Scale and/or Dyspnea Scale  Uses Perceived Exertion Scale and/or Dyspnea Scale  D.  Nutrition/Weight Control/Body Composition:  Adherence to prescribed nutrition program: good    E.  Blood Lipids    Lab Results  Component Value Date   CHOL 182 01/21/2012   HDL 52 01/21/2012   LDLCALC 101* 01/21/2012   TRIG 147 01/21/2012   CHOLHDL 3.5 01/21/2012    F.  Lifestyle Changes:  Making positive lifestyle changes and Not smoking:  Quit never smoked  G.  Symptoms noted with exercise:  Asymptomatic  Report Completed By:  Oletta Lamas. Carrol Hougland RN   Comments:  This is patients halfway report. She has completed 18 sessions. She achieved a peak METS of 2.69. Her resting HR was 82 and BP was 110/50, Her peak HR was 94 and peak BP was 132/68.A graduation report will follow upon her 36th visit

## 2013-11-18 NOTE — Progress Notes (Signed)
Cardiac Rehabilitation Program11/24/2014 Outcomes Report   Orientation:  08/26/2013 !st week report 09/02/2013 Graduate Date:  tbd Discharge Date:  tbd # of sessions completed: 3 YT:KPTWS repair  Cardiologist: Harl Bowie Family MD:  Homero Fellers Time:  08:15  A.  Exercise Program:  Tolerates exercise @ 2.00 METS for 15 minutes  B.  Mental Health:  Good mental attitude  C.  Education/Instruction/Skills  Accurately checks own pulse.  Rest:  86  Exercise:  112, Knows THR for exercise and Uses Perceived Exertion Scale and/or Dyspnea Scale  Uses Perceived Exertion Scale and/or Dyspnea Scale  D.  Nutrition/Weight Control/Body Composition:  Adherence to prescribed nutrition program: good    E.  Blood Lipids    Lab Results  Component Value Date   CHOL 182 01/21/2012   HDL 52 01/21/2012   LDLCALC 101* 01/21/2012   TRIG 147 01/21/2012   CHOLHDL 3.5 01/21/2012    F.  Lifestyle Changes:  Making positive lifestyle changes and Not smoking:  Quit never  smoked  G.  Symptoms noted with exercise:  Asymptomatic  Report Completed By:  Oletta Lamas. Cyndy Braver RN   Comments:  This is patients 1st week report. It is her 1st day at [facility], [facility],then transferred here, it is closer to her home. She achieved a peak Mets of 2.00. Her resting HR is 86 and resting BP is 130/62 and her peak HR is 112 and peak BP is 140/78. A report will follow upon her halfway point her 18th visit.

## 2013-11-18 NOTE — Addendum Note (Signed)
Encounter addended by: Norlene Duel, RN on: 11/18/2013  7:21 AM<BR>     Documentation filed: Clinical Notes

## 2013-11-18 NOTE — Addendum Note (Signed)
Encounter addended by: Norlene Duel, RN on: 11/18/2013  2:04 PM<BR>     Documentation filed: Clinical Notes

## 2013-11-20 ENCOUNTER — Encounter (HOSPITAL_COMMUNITY): Payer: Medicare Other

## 2013-11-20 ENCOUNTER — Telehealth: Payer: Self-pay | Admitting: Cardiology

## 2013-11-20 DIAGNOSIS — I4891 Unspecified atrial fibrillation: Secondary | ICD-10-CM

## 2013-11-20 MED ORDER — WARFARIN SODIUM 2.5 MG PO TABS: ORAL_TABLET | ORAL | Status: DC

## 2013-11-20 NOTE — Telephone Encounter (Signed)
Patient needs Coumadin refill sent to [facility] / [facility]

## 2013-11-22 ENCOUNTER — Encounter (HOSPITAL_COMMUNITY): Payer: Medicare Other

## 2013-11-22 ENCOUNTER — Ambulatory Visit (HOSPITAL_COMMUNITY)
Admission: RE | Admit: 2013-11-22 | Discharge: 2013-11-22 | Disposition: A | Discharge status: Home / Self Care | Payer: Medicare Other | Source: Ambulatory Visit | Attending: Internal Medicine | Admitting: Internal Medicine

## 2013-11-22 DIAGNOSIS — Z1231 Encounter for screening mammogram for malignant neoplasm of breast: Secondary | ICD-10-CM

## 2013-11-25 ENCOUNTER — Encounter (HOSPITAL_COMMUNITY): Payer: Medicare Other

## 2013-11-25 ENCOUNTER — Ambulatory Visit (HOSPITAL_COMMUNITY)
Admission: RE | Admit: 2013-11-25 | Discharge: 2013-11-25 | Disposition: A | Discharge status: Home / Self Care | Payer: Medicare Other | Source: Ambulatory Visit | Attending: Cardiology | Admitting: Cardiology

## 2013-11-25 DIAGNOSIS — Z952 Presence of prosthetic heart valve: Secondary | ICD-10-CM | POA: Insufficient documentation

## 2013-11-25 DIAGNOSIS — Z5189 Encounter for other specified aftercare: Secondary | ICD-10-CM | POA: Insufficient documentation

## 2013-11-25 DIAGNOSIS — I4892 Unspecified atrial flutter: Secondary | ICD-10-CM | POA: Insufficient documentation

## 2013-11-25 DIAGNOSIS — I4891 Unspecified atrial fibrillation: Secondary | ICD-10-CM | POA: Insufficient documentation

## 2013-11-26 NOTE — Progress Notes (Signed)
Cardiac Rehabilitation Program Outcomes Report   Orientation:  08/26/2013 Graduate Date:  11/25/2013 Discharge Date:  11/25/2013 # of sessions completed: 57 DX Valve stenosis/ AVR  Cardiologist: Harl Bowie Family MD:  Homero Fellers Time:  08:15  A.  Exercise Program:  Tolerates exercise @ 2.69 METS for 15 minutes and Walk Test Results:  Post: No Post walk Test performed , No pre test could be found.  B.  Mental Health:  Good mental attitude  C.  Education/Instruction/Skills  Accurately checks own pulse.  Rest:  79  Exercise: 109, Knows THR for exercise, Uses Perceived Exertion Scale and/or Dyspnea Scale and Attended 11 education classes  Uses Perceived Exertion Scale and/or Dyspnea Scale  D.  Nutrition/Weight Control/Body Composition:  Adherence to prescribed nutrition program: good    E.  Blood Lipids    Lab Results  Component Value Date   CHOL 182 01/21/2012   HDL 52 01/21/2012   LDLCALC 101* 01/21/2012   TRIG 147 01/21/2012   CHOLHDL 3.5 01/21/2012    F.  Lifestyle Changes:  Making positive lifestyle changes and Not smoking:  Quit never smoked  G.  Symptoms noted with exercise:  Asymptomatic  Report Completed By:  Oletta Lamas.Yanette Tripoli RN   Comments:  This is patients graduation report  She completed the full 36 th sessions. She achieved a peak METS  Of  2.69 . Her resting HR was 79 and resting BP was 110/62 and peak Hr was 109 and peak BP was 118/66. A call will be made to patient at 1 month, 6th months and 1 year to ensure exercising compliance.

## 2013-11-27 ENCOUNTER — Encounter (HOSPITAL_COMMUNITY): Payer: Medicare Other

## 2013-11-29 ENCOUNTER — Encounter (HOSPITAL_COMMUNITY): Payer: Medicare Other

## 2013-12-05 ENCOUNTER — Ambulatory Visit (INDEPENDENT_AMBULATORY_CARE_PROVIDER_SITE_OTHER): Payer: Medicare Other | Admitting: Cardiology

## 2013-12-05 ENCOUNTER — Encounter: Payer: Self-pay | Admitting: Cardiology

## 2013-12-05 ENCOUNTER — Ambulatory Visit: Payer: Medicare Other | Admitting: Cardiology

## 2013-12-05 VITALS — BP 121/74 | HR 60 | Ht 62.0 in | Wt 167.0 lb

## 2013-12-05 DIAGNOSIS — I4891 Unspecified atrial fibrillation: Secondary | ICD-10-CM

## 2013-12-05 DIAGNOSIS — I350 Nonrheumatic aortic (valve) stenosis: Secondary | ICD-10-CM

## 2013-12-05 DIAGNOSIS — R609 Edema, unspecified: Secondary | ICD-10-CM

## 2013-12-05 DIAGNOSIS — I10 Essential (primary) hypertension: Secondary | ICD-10-CM

## 2013-12-05 DIAGNOSIS — I359 Nonrheumatic aortic valve disorder, unspecified: Secondary | ICD-10-CM

## 2013-12-05 DIAGNOSIS — I050 Rheumatic mitral stenosis: Secondary | ICD-10-CM

## 2013-12-05 MED ORDER — FUROSEMIDE 40 MG PO TABS: 40.0000 mg | ORAL_TABLET | Freq: Two times a day (BID) | ORAL | Status: DC

## 2013-12-05 MED ORDER — METOPROLOL TARTRATE 25 MG PO TABS: 25.0000 mg | ORAL_TABLET | Freq: Two times a day (BID) | ORAL | Status: DC

## 2013-12-05 NOTE — Patient Instructions (Signed)
Your physician recommends that you schedule a follow-up appointment in: 6 months with Dr. Harl Bowie. You should receive a letter in the mail in 4 months. If you do not receive this letter by July 2015 call our office to schedule this appointment.   Your physician has recommended you make the following change in your medication:  Increase: Furosemide 40 MG take 1 tablet by mouth twice daily  Continue all other medications the same.

## 2013-12-05 NOTE — Progress Notes (Signed)
Clinical Summary [name] is a 68 y.o.female seen today for follow up of the following medical problems.   1. Valvular heart disease   - 07/18/13 patient underwent MVR with 25 mm Edwards Magna-ease pericardial valve and also AVR with 21 mm Edwards Magna-ease pericardial valve  - denies any SOB or DOE, remains physically active and completed cardiac rehab. Has some LE at times that responds to increased lasix. No orthopnea. No chest pain   2. Afib  - denies any palpitatoins  - compliant with coumadin, denies any issues with bleeding  - compliant w/ dilitiazem and metoprolol  - presumed allergy to amiodarone, medication was discontinued and rash resolved.    Past Medical History  Diagnosis Date  . Rheumatic heart disease     a. 07/2013 Echo: Ef 55-60%, Mod AS/AI, Mod-Sev MS, sev dil LA, PASP 58;  b. 07/2013 TEE: EF 35-40%, mild-mod AS/AI, mod-sev MS, LA smoke;  c. 07/2013 Cath: elev R heart pressures, nl cors.  . Gastroesophageal reflux disease   . Hyperlipidemia   . Cardioembolic stroke 06/5187    Right frontal in 01/2012; normal carotid ultrasound; possible LAA thrombus by TEE; virtual complete neurologic recovery  . Fasting hyperglycemia     120 fasting  . Chronic kidney disease, stage 2, mildly decreased GFR     GFR of approximately 60  . Breast carcinoma 1995    1995  . Depression   . Diverticulosis of colon (without mention of hemorrhage) 2012    Dr. Laural Golden  . Gastroparesis   . Hemorrhoids   . Anemia   . Shortness of breath   . Hypertension     pt denies 05/30/13     Dr Kathe Mariner  . Atrial fibrillation   . Hyponatremia      Allergies  Allergen Reactions  . Amiodarone Rash     Current Outpatient Prescriptions  Medication Sig Dispense Refill  . albuterol (VENTOLIN HFA) 108 (90 BASE) MCG/ACT inhaler Inhale 2 puffs into the lungs every 4 (four) hours as needed for wheezing.      Marland Kitchen atorvastatin (LIPITOR) 20 MG tablet Take 20 mg by mouth daily.        . beclomethasone (QVAR) 80 MCG/ACT inhaler Inhale 2 puffs into the lungs 2 (two) times daily.  1 Inhaler  6  . buPROPion (WELLBUTRIN XL) 150 MG 24 hr tablet Take 150 mg by mouth daily.        Marland Kitchen diltiazem (CARDIZEM CD) 180 MG 24 hr capsule Take 1 capsule (180 mg total) by mouth daily.  30 capsule  1  . fluticasone (FLONASE) 50 MCG/ACT nasal spray Place 1 spray into both nostrils daily. prn  16 g  2  . furosemide (LASIX) 40 MG tablet Take 1 tablet (40 mg total) by mouth daily.  30 tablet  0  . hydrOXYzine (ATARAX/VISTARIL) 25 MG tablet Take 1 tablet (25 mg total) by mouth every 8 (eight) hours as needed.  30 tablet  0  . levothyroxine (SYNTHROID, LEVOTHROID) 150 MCG tablet Take 1 tablet (150 mcg total) by mouth daily before breakfast.  30 tablet  1  . metFORMIN (GLUCOPHAGE) 500 MG tablet Take 1 tablet (500 mg total) by mouth 2 (two) times daily with a meal.  60 tablet  1  . metoprolol tartrate (LOPRESSOR) 25 MG tablet Take 1 tablet (25 mg total) by mouth 2 (two) times daily.  60 tablet  6  . Multiple Vitamin (MULITIVITAMIN WITH MINERALS) TABS Take 1  tablet by mouth daily.      . nitroGLYCERIN (NITROSTAT) 0.4 MG SL tablet Place 1 tablet (0.4 mg total) under the tongue every 5 (five) minutes as needed for chest pain.  90 tablet  3  . omeprazole (PRILOSEC) 20 MG capsule Take 20 mg by mouth 2 (two) times daily before a meal.       . oxyCODONE (OXY IR/ROXICODONE) 5 MG immediate release tablet       . potassium chloride SA (K-DUR,KLOR-CON) 20 MEQ tablet Take 1 tablet (20 mEq total) by mouth daily.  30 tablet  0  . saccharomyces boulardii (FLORASTOR) 250 MG capsule Take 250 mg by mouth daily.      Marland Kitchen warfarin (COUMADIN) 2.5 MG tablet Take 1 tablet daily except 2 tablets daily on M,W,F or as directed by [facility]  45 tablet  6   No current facility-administered medications for this visit.     Past Surgical History  Procedure Laterality Date  . Mitral valve surgery  [facility], closed mitral  valvulotomy by finger fracture  . Cardiac catheterization    . Dilation and curettage of uterus    . Breast lumpectomy Right 1995  . Tubal ligation  1973  . Tee without cardioversion  01/24/2012    Procedure: TRANSESOPHAGEAL ECHOCARDIOGRAM (TEE);  Surgeon: Lelon Perla, MD;  Location: [facility] ENDOSCOPY;  Service: Cardiovascular;  Laterality: N/A;  . Colonoscopy  2012    Negative screening procedure  . Esophageal manometry N/A 06/17/2013    Procedure: ESOPHAGEAL MANOMETRY (EM);  Surgeon: Sable Feil, MD;  Location: [facility] ENDOSCOPY;  Service: Endoscopy;  Laterality: N/A;  . Tee without cardioversion N/A 07/11/2013    Procedure: TRANSESOPHAGEAL ECHOCARDIOGRAM (TEE);  Surgeon: Thayer Headings, MD;  Location: [place];  Service: Cardiovascular;  Laterality: N/A;  . Aortic valve replacement N/A 07/18/2013    Procedure: AORTIC VALVE REPLACEMENT (AVR);  Surgeon: Gaye Pollack, MD;  Location: [place];  Service: Open Heart Surgery;  Laterality: N/A;  . Intraoperative transesophageal echocardiogram N/A 07/18/2013    Procedure: INTRAOPERATIVE TRANSESOPHAGEAL ECHOCARDIOGRAM;  Surgeon: Gaye Pollack, MD;  Location: [facility] OR;  Service: Open Heart Surgery;  Laterality: N/A;  . Mitral valve replacement N/A 07/18/2013    Procedure: MITRAL VALVE (MV) REPLACEMENT;  Surgeon: Gaye Pollack, MD;  Location: [place] OR;  Service: Open Heart Surgery;  Laterality: N/A;  . Tee without cardioversion N/A 07/26/2013    Procedure: TRANSESOPHAGEAL ECHOCARDIOGRAM (TEE);  Surgeon: Fay Records, MD;  Location: [facility] ENDOSCOPY;  Service: Cardiovascular;  Laterality: N/A;  . Cardioversion N/A 07/26/2013    Procedure: CARDIOVERSION;  Surgeon: Fay Records, MD;  Location: [facility] ENDOSCOPY;  Service: Cardiovascular;  Laterality: N/A;     Allergies  Allergen Reactions  . Amiodarone Rash      Family History  Problem Relation Age of Onset  . Heart disease Brother 61    MI  . Rheum arthritis Maternal Grandmother   . Asthma Maternal  Grandfather   . Hypothyroidism Mother   . Diabetes Sister   . Cirrhosis Father   . Cervical cancer Sister     malignant neoplasm of cervix uteri     Social History [name] reports that she has never smoked. She has never used smokeless tobacco. [name] reports that she does not drink alcohol.   Review of Systems CONSTITUTIONAL: No weight loss, fever, chills, weakness or fatigue.  HEENT: Eyes: No visual loss, blurred vision, double vision or yellow sclerae.No  hearing loss, sneezing, congestion, runny nose or sore throat.  SKIN: No rash or itching.  CARDIOVASCULAR: per HPI RESPIRATORY: No shortness of breath, cough or sputum.  GASTROINTESTINAL: No anorexia, nausea, vomiting or diarrhea. No abdominal pain or blood.  GENITOURINARY: No burning on urination, no polyuria NEUROLOGICAL: No headache, dizziness, syncope, paralysis, ataxia, numbness or tingling in the extremities. No change in bowel or bladder control.  MUSCULOSKELETAL: No muscle, back pain, joint pain or stiffness.  LYMPHATICS: No enlarged nodes. No history of splenectomy.  PSYCHIATRIC: No history of depression or anxiety.  ENDOCRINOLOGIC: No reports of sweating, cold or heat intolerance. No polyuria or polydipsia.  Marland Kitchen   Physical Examination p 60 bp 121/74 Wt 167 lbs BMI 31 Gen: resting comfortably, no acute distress HEENT: no scleral icterus, pupils equal round and reactive, no palptable cervical adenopathy,  CV:RRR, no m/r/g, no JVD, no carotid bruits Resp: Clear to auscultation bilaterally GI: abdomen is soft, non-tender, non-distended, normal bowel sounds, no hepatosplenomegaly MSK: extremities are warm, no edema.  Skin: warm, no rash Neuro:  no focal deficits Psych: appropriate affect   Diagnostic Studies Hemodynamic Findings:  Ao: 132/57  LV: 144/2/11  RA: 10  RV: 63/2/10  PA: 62/19 (mean 41)  PCWP: 19  Fick Cardiac Output: 3.73 L/min  Fick Cardiac Index: 2.11 L/min/m2  Central Aortic  Saturation: 94%  Pulmonary Artery Saturation: 58%  Aortic valve: Peak to peak gradient 12 mm Hg. Valve easily crossed with J-wire.  Angiographic Findings:  Left main: No obstructive disease.  Left Anterior Descending Artery: Large caliber vessel that courses to the apex. The mid and distal vessel becomes small in caliber. Small caliber diagonal Lajean Boese. No obstructive disease.  Circumflex Artery: Large caliber vessel with two small obtuse marginal branches. No obstructive disease.  Right Coronary Artery: Large, dominant vessel with no obstructive disease.  Left Ventricular Angiogram: Deferred.  Impression:  1. No angiographic evidence of CAD  2. Elevated filling pressures    Pertinent labs  12/14 INR: 2.1  08/08/13: Na 138 K 4.7 BUN 8 Cr 0.89      Assessment and Plan  1. Valvular heart disease  - history of mitral and aortic stenosis, s/p tissue valve replacement of both valves  - doing well from a symptoms standpoint, last imaging 07/26/13 TEE describes normal functioning prosthetic valves  - she does have occasional LE edema, counseled on those days she can take her lasix bid   2. Afib  - unclear how long she has had afib, was diagnosed postoperatively but she does have a history of severe mitral stenosis, left atrial enlargement, and prior cardioembolic stroke. I suspect likely was ongoing before her recent surgery.  - denies any significant symptoms, she is rate controlled today in clinic. Continue current meds    F/u 6 months       Arnoldo Lenis, M.D., F.A.C.C.

## 2013-12-09 ENCOUNTER — Ambulatory Visit (INDEPENDENT_AMBULATORY_CARE_PROVIDER_SITE_OTHER): Payer: Medicare Other | Admitting: *Deleted

## 2013-12-09 DIAGNOSIS — I6340 Cerebral infarction due to embolism of unspecified cerebral artery: Secondary | ICD-10-CM

## 2013-12-09 DIAGNOSIS — Z5181 Encounter for therapeutic drug level monitoring: Secondary | ICD-10-CM

## 2013-12-09 DIAGNOSIS — Z954 Presence of other heart-valve replacement: Secondary | ICD-10-CM

## 2013-12-09 DIAGNOSIS — I4891 Unspecified atrial fibrillation: Secondary | ICD-10-CM

## 2013-12-09 DIAGNOSIS — I6350 Cerebral infarction due to unspecified occlusion or stenosis of unspecified cerebral artery: Secondary | ICD-10-CM

## 2013-12-09 DIAGNOSIS — Z7901 Long term (current) use of anticoagulants: Secondary | ICD-10-CM

## 2013-12-09 DIAGNOSIS — I639 Cerebral infarction, unspecified: Secondary | ICD-10-CM

## 2013-12-09 LAB — POCT INR: INR: 2.2

## 2013-12-09 MED ORDER — WARFARIN SODIUM 2.5 MG PO TABS: ORAL_TABLET | ORAL | Status: DC

## 2013-12-30 ENCOUNTER — Ambulatory Visit (INDEPENDENT_AMBULATORY_CARE_PROVIDER_SITE_OTHER): Payer: Medicare Other | Admitting: *Deleted

## 2013-12-30 DIAGNOSIS — I6350 Cerebral infarction due to unspecified occlusion or stenosis of unspecified cerebral artery: Secondary | ICD-10-CM

## 2013-12-30 DIAGNOSIS — Z7901 Long term (current) use of anticoagulants: Secondary | ICD-10-CM

## 2013-12-30 DIAGNOSIS — I639 Cerebral infarction, unspecified: Secondary | ICD-10-CM

## 2013-12-30 DIAGNOSIS — Z5181 Encounter for therapeutic drug level monitoring: Secondary | ICD-10-CM

## 2013-12-30 DIAGNOSIS — I4891 Unspecified atrial fibrillation: Secondary | ICD-10-CM

## 2013-12-30 DIAGNOSIS — I6340 Cerebral infarction due to embolism of unspecified cerebral artery: Secondary | ICD-10-CM

## 2013-12-30 DIAGNOSIS — Z954 Presence of other heart-valve replacement: Secondary | ICD-10-CM

## 2013-12-30 LAB — POCT INR: INR: 4

## 2014-01-20 ENCOUNTER — Ambulatory Visit (INDEPENDENT_AMBULATORY_CARE_PROVIDER_SITE_OTHER): Payer: Medicare Other | Admitting: *Deleted

## 2014-01-20 DIAGNOSIS — I4891 Unspecified atrial fibrillation: Secondary | ICD-10-CM

## 2014-01-20 DIAGNOSIS — Z7901 Long term (current) use of anticoagulants: Secondary | ICD-10-CM

## 2014-01-20 DIAGNOSIS — I6340 Cerebral infarction due to embolism of unspecified cerebral artery: Secondary | ICD-10-CM

## 2014-01-20 DIAGNOSIS — I6350 Cerebral infarction due to unspecified occlusion or stenosis of unspecified cerebral artery: Secondary | ICD-10-CM

## 2014-01-20 DIAGNOSIS — I639 Cerebral infarction, unspecified: Secondary | ICD-10-CM

## 2014-01-20 DIAGNOSIS — Z5181 Encounter for therapeutic drug level monitoring: Secondary | ICD-10-CM

## 2014-01-20 DIAGNOSIS — Z954 Presence of other heart-valve replacement: Secondary | ICD-10-CM

## 2014-01-20 LAB — POCT INR: INR: 4.4

## 2014-02-03 ENCOUNTER — Ambulatory Visit (INDEPENDENT_AMBULATORY_CARE_PROVIDER_SITE_OTHER): Payer: Medicare Other | Admitting: *Deleted

## 2014-02-03 DIAGNOSIS — I6340 Cerebral infarction due to embolism of unspecified cerebral artery: Secondary | ICD-10-CM

## 2014-02-03 DIAGNOSIS — Z5181 Encounter for therapeutic drug level monitoring: Secondary | ICD-10-CM

## 2014-02-03 DIAGNOSIS — Z7901 Long term (current) use of anticoagulants: Secondary | ICD-10-CM

## 2014-02-03 DIAGNOSIS — I4891 Unspecified atrial fibrillation: Secondary | ICD-10-CM

## 2014-02-03 DIAGNOSIS — I639 Cerebral infarction, unspecified: Secondary | ICD-10-CM

## 2014-02-03 DIAGNOSIS — I6350 Cerebral infarction due to unspecified occlusion or stenosis of unspecified cerebral artery: Secondary | ICD-10-CM

## 2014-02-03 DIAGNOSIS — Z954 Presence of other heart-valve replacement: Secondary | ICD-10-CM

## 2014-02-03 LAB — POCT INR: INR: 3.5

## 2014-02-05 ENCOUNTER — Telehealth: Payer: Self-pay | Admitting: Cardiology

## 2014-02-05 NOTE — Telephone Encounter (Signed)
NEEDS TO KNOW IF SHE NEEDS TO CHANGE HER DOSAGE DUE TO TAKING MACRODANTI  100MG  2 TWICE HAS NOT STARTED TAKING YET WILL TONIGHT OR TOMORROW.

## 2014-02-05 NOTE — Telephone Encounter (Signed)
Will be starting Macrodantin 100mg  BID tonight x 7 days for UTI.  Told husband to have her decrease coumadin to 1 tablet (2.5mg ) daily except 1 1/2 tablets (3.75mg ) on M,W,F while on Abx and INR appt moved up to 02/13/14.  He verbalized understanding.

## 2014-02-12 ENCOUNTER — Ambulatory Visit (HOSPITAL_COMMUNITY)
Admission: RE | Admit: 2014-02-12 | Discharge: 2014-02-12 | Disposition: A | Discharge status: Home / Self Care | Payer: Medicare Other | Source: Ambulatory Visit | Attending: Pulmonary Disease | Admitting: Pulmonary Disease

## 2014-02-12 ENCOUNTER — Other Ambulatory Visit (HOSPITAL_COMMUNITY): Payer: Self-pay | Admitting: Pulmonary Disease

## 2014-02-12 DIAGNOSIS — R05 Cough: Secondary | ICD-10-CM | POA: Insufficient documentation

## 2014-02-12 DIAGNOSIS — R059 Cough, unspecified: Secondary | ICD-10-CM | POA: Insufficient documentation

## 2014-02-13 ENCOUNTER — Ambulatory Visit (INDEPENDENT_AMBULATORY_CARE_PROVIDER_SITE_OTHER): Payer: Medicare Other | Admitting: *Deleted

## 2014-02-13 DIAGNOSIS — I639 Cerebral infarction, unspecified: Secondary | ICD-10-CM

## 2014-02-13 DIAGNOSIS — Z7901 Long term (current) use of anticoagulants: Secondary | ICD-10-CM

## 2014-02-13 DIAGNOSIS — Z5181 Encounter for therapeutic drug level monitoring: Secondary | ICD-10-CM

## 2014-02-13 DIAGNOSIS — I4891 Unspecified atrial fibrillation: Secondary | ICD-10-CM

## 2014-02-13 DIAGNOSIS — Z954 Presence of other heart-valve replacement: Secondary | ICD-10-CM

## 2014-02-13 DIAGNOSIS — I6350 Cerebral infarction due to unspecified occlusion or stenosis of unspecified cerebral artery: Secondary | ICD-10-CM

## 2014-02-13 DIAGNOSIS — I6340 Cerebral infarction due to embolism of unspecified cerebral artery: Secondary | ICD-10-CM

## 2014-02-13 LAB — POCT INR: INR: 2.7

## 2014-02-26 ENCOUNTER — Ambulatory Visit (INDEPENDENT_AMBULATORY_CARE_PROVIDER_SITE_OTHER): Payer: Medicare Other | Admitting: *Deleted

## 2014-02-26 DIAGNOSIS — Z5181 Encounter for therapeutic drug level monitoring: Secondary | ICD-10-CM

## 2014-02-26 DIAGNOSIS — Z954 Presence of other heart-valve replacement: Secondary | ICD-10-CM

## 2014-02-26 DIAGNOSIS — I4891 Unspecified atrial fibrillation: Secondary | ICD-10-CM

## 2014-02-26 DIAGNOSIS — Z7901 Long term (current) use of anticoagulants: Secondary | ICD-10-CM

## 2014-02-26 DIAGNOSIS — I6340 Cerebral infarction due to embolism of unspecified cerebral artery: Secondary | ICD-10-CM

## 2014-02-26 DIAGNOSIS — I6350 Cerebral infarction due to unspecified occlusion or stenosis of unspecified cerebral artery: Secondary | ICD-10-CM

## 2014-02-26 DIAGNOSIS — I639 Cerebral infarction, unspecified: Secondary | ICD-10-CM

## 2014-02-26 LAB — POCT INR: INR: 3.7

## 2014-03-19 ENCOUNTER — Ambulatory Visit (INDEPENDENT_AMBULATORY_CARE_PROVIDER_SITE_OTHER): Payer: Medicare Other | Admitting: *Deleted

## 2014-03-19 DIAGNOSIS — Z954 Presence of other heart-valve replacement: Secondary | ICD-10-CM

## 2014-03-19 DIAGNOSIS — I6340 Cerebral infarction due to embolism of unspecified cerebral artery: Secondary | ICD-10-CM

## 2014-03-19 DIAGNOSIS — Z7901 Long term (current) use of anticoagulants: Secondary | ICD-10-CM

## 2014-03-19 DIAGNOSIS — I6350 Cerebral infarction due to unspecified occlusion or stenosis of unspecified cerebral artery: Secondary | ICD-10-CM

## 2014-03-19 DIAGNOSIS — Z5181 Encounter for therapeutic drug level monitoring: Secondary | ICD-10-CM

## 2014-03-19 DIAGNOSIS — I639 Cerebral infarction, unspecified: Secondary | ICD-10-CM

## 2014-03-19 DIAGNOSIS — I4891 Unspecified atrial fibrillation: Secondary | ICD-10-CM

## 2014-03-19 LAB — POCT INR: INR: 3.6

## 2014-04-09 ENCOUNTER — Ambulatory Visit (INDEPENDENT_AMBULATORY_CARE_PROVIDER_SITE_OTHER): Payer: Medicare Other | Admitting: *Deleted

## 2014-04-09 DIAGNOSIS — Z954 Presence of other heart-valve replacement: Secondary | ICD-10-CM

## 2014-04-09 DIAGNOSIS — I6350 Cerebral infarction due to unspecified occlusion or stenosis of unspecified cerebral artery: Secondary | ICD-10-CM

## 2014-04-09 DIAGNOSIS — I6340 Cerebral infarction due to embolism of unspecified cerebral artery: Secondary | ICD-10-CM

## 2014-04-09 DIAGNOSIS — Z5181 Encounter for therapeutic drug level monitoring: Secondary | ICD-10-CM

## 2014-04-09 DIAGNOSIS — Z7901 Long term (current) use of anticoagulants: Secondary | ICD-10-CM

## 2014-04-09 DIAGNOSIS — I639 Cerebral infarction, unspecified: Secondary | ICD-10-CM

## 2014-04-09 DIAGNOSIS — I4891 Unspecified atrial fibrillation: Secondary | ICD-10-CM

## 2014-04-09 LAB — POCT INR: INR: 4.2

## 2014-04-14 ENCOUNTER — Other Ambulatory Visit (HOSPITAL_COMMUNITY): Payer: Self-pay | Admitting: Orthopaedic Surgery

## 2014-04-14 DIAGNOSIS — M545 Low back pain: Secondary | ICD-10-CM

## 2014-04-16 ENCOUNTER — Telehealth: Payer: Self-pay | Admitting: *Deleted

## 2014-04-16 NOTE — Telephone Encounter (Signed)
Pt was put on prednisone yesterday

## 2014-04-16 NOTE — Telephone Encounter (Signed)
Spoke with husband.  Pt was started on prednisone 4mg  taper pack last Friday 7/10 for back and hip pain.  She took last dose today.  Told pt to hold coumadin tonight then continue current dose until INR check on 7/22.  Husband verbalized understanding.

## 2014-04-17 ENCOUNTER — Ambulatory Visit (HOSPITAL_COMMUNITY)
Admission: RE | Admit: 2014-04-17 | Discharge: 2014-04-17 | Disposition: A | Discharge status: Home / Self Care | Payer: Medicare Other | Source: Ambulatory Visit | Attending: Orthopaedic Surgery | Admitting: Orthopaedic Surgery

## 2014-04-17 DIAGNOSIS — M545 Low back pain: Secondary | ICD-10-CM

## 2014-04-17 DIAGNOSIS — M7130 Other bursal cyst, unspecified site: Secondary | ICD-10-CM | POA: Insufficient documentation

## 2014-04-17 DIAGNOSIS — M5137 Other intervertebral disc degeneration, lumbosacral region: Secondary | ICD-10-CM | POA: Insufficient documentation

## 2014-04-17 DIAGNOSIS — M51379 Other intervertebral disc degeneration, lumbosacral region without mention of lumbar back pain or lower extremity pain: Secondary | ICD-10-CM | POA: Insufficient documentation

## 2014-04-17 DIAGNOSIS — M25559 Pain in unspecified hip: Secondary | ICD-10-CM | POA: Insufficient documentation

## 2014-04-17 DIAGNOSIS — Z853 Personal history of malignant neoplasm of breast: Secondary | ICD-10-CM | POA: Insufficient documentation

## 2014-04-17 MED ORDER — GADOBENATE DIMEGLUMINE 529 MG/ML IV SOLN
15.0000 mL | Freq: Once | INTRAVENOUS | Status: AC | PRN
  Administered 2014-04-17: 15 mL via INTRAVENOUS

## 2014-04-18 LAB — POCT I-STAT CREATININE: CREATININE: 1.1 mg/dL (ref 0.50–1.10)

## 2014-04-21 ENCOUNTER — Telehealth: Payer: Self-pay | Admitting: *Deleted

## 2014-04-21 NOTE — Telephone Encounter (Signed)
Spoke with wife.  She saw Dr Luna Glasgow this morning to discuss MRI of hip done last Thursday.  States she has a cyst that is affecting a nerve and he is sending her to see a neurologist.  Appt not made yet.  Thinks she may need to come off coumadin if she has to have ? Needle aspiration.  Informed pt we will need to see what neurologist says at time of appt before making a decision regarding coumadin.

## 2014-04-21 NOTE — Telephone Encounter (Signed)
Patient's husband would like to speak with you regarding patient having procedures and medication changes. /t tgs

## 2014-04-23 ENCOUNTER — Ambulatory Visit (INDEPENDENT_AMBULATORY_CARE_PROVIDER_SITE_OTHER): Payer: Medicare Other | Admitting: *Deleted

## 2014-04-23 DIAGNOSIS — I6340 Cerebral infarction due to embolism of unspecified cerebral artery: Secondary | ICD-10-CM

## 2014-04-23 DIAGNOSIS — Z7901 Long term (current) use of anticoagulants: Secondary | ICD-10-CM

## 2014-04-23 DIAGNOSIS — I639 Cerebral infarction, unspecified: Secondary | ICD-10-CM

## 2014-04-23 DIAGNOSIS — Z954 Presence of other heart-valve replacement: Secondary | ICD-10-CM

## 2014-04-23 DIAGNOSIS — I6350 Cerebral infarction due to unspecified occlusion or stenosis of unspecified cerebral artery: Secondary | ICD-10-CM

## 2014-04-23 DIAGNOSIS — Z5181 Encounter for therapeutic drug level monitoring: Secondary | ICD-10-CM

## 2014-04-23 DIAGNOSIS — I4891 Unspecified atrial fibrillation: Secondary | ICD-10-CM

## 2014-04-23 LAB — POCT INR: INR: 4.6

## 2014-04-30 ENCOUNTER — Ambulatory Visit (INDEPENDENT_AMBULATORY_CARE_PROVIDER_SITE_OTHER): Payer: Medicare Other | Admitting: *Deleted

## 2014-04-30 DIAGNOSIS — Z5181 Encounter for therapeutic drug level monitoring: Secondary | ICD-10-CM

## 2014-04-30 DIAGNOSIS — Z7901 Long term (current) use of anticoagulants: Secondary | ICD-10-CM

## 2014-04-30 DIAGNOSIS — I639 Cerebral infarction, unspecified: Secondary | ICD-10-CM

## 2014-04-30 DIAGNOSIS — I4891 Unspecified atrial fibrillation: Secondary | ICD-10-CM

## 2014-04-30 DIAGNOSIS — I6350 Cerebral infarction due to unspecified occlusion or stenosis of unspecified cerebral artery: Secondary | ICD-10-CM

## 2014-04-30 DIAGNOSIS — I6340 Cerebral infarction due to embolism of unspecified cerebral artery: Secondary | ICD-10-CM

## 2014-04-30 DIAGNOSIS — Z954 Presence of other heart-valve replacement: Secondary | ICD-10-CM

## 2014-04-30 LAB — POCT INR: INR: 3.6

## 2014-05-01 ENCOUNTER — Encounter: Payer: Self-pay | Admitting: Cardiology

## 2014-05-01 ENCOUNTER — Ambulatory Visit (INDEPENDENT_AMBULATORY_CARE_PROVIDER_SITE_OTHER): Payer: Medicare Other | Admitting: Cardiology

## 2014-05-01 VITALS — BP 138/60 | HR 63 | Ht 62.0 in | Wt 176.0 lb

## 2014-05-01 DIAGNOSIS — I4891 Unspecified atrial fibrillation: Secondary | ICD-10-CM

## 2014-05-01 DIAGNOSIS — I38 Endocarditis, valve unspecified: Secondary | ICD-10-CM

## 2014-05-01 DIAGNOSIS — Z01810 Encounter for preprocedural cardiovascular examination: Secondary | ICD-10-CM

## 2014-05-01 NOTE — Patient Instructions (Signed)
Your physician wants you to follow-up in: 6 months You will receive a reminder letter in the mail two months in advance. If you don't receive a letter, please call our office to schedule the follow-up appointment.     Your physician recommends that you continue on your current medications as directed. Please refer to the Current Medication list given to you today.      Thank you for choosing [facility] !        

## 2014-05-01 NOTE — Progress Notes (Signed)
Clinical Summary Ms. Kittleson is a 68 y.o.female seen today for follow up of the following medical problems.   1. Valvular heart disease  - 07/18/13 patient underwent MVR with 25 mm Edwards Magna-ease pericardial valve and also AVR with 21 mm Edwards Magna-ease pericardial valve   - denies any SOB or DOE, remains physically active and completed cardiac rehab. Has some LE at times that responds to increased lasix. No orthopnea. No chest pain  - had been going to gym 3 times a week, 69min on treadmill without significant limitation   2. Afib  - hx of cardioembolic CVA, has been on longterm anticoag - denies any palpitations - compliant with coumadin, denies any issues with bleeding  - compliant w/ dilitiazem and metoprolol   3. Preoperative evaluation - patient being considered for back surgery, L4/L5 lumbar laminectomy   Past Medical History  Diagnosis Date  . Rheumatic heart disease     a. 07/2013 Echo: Ef 55-60%, Mod AS/AI, Mod-Sev MS, sev dil LA, PASP 58;  b. 07/2013 TEE: EF 35-40%, mild-mod AS/AI, mod-sev MS, LA smoke;  c. 07/2013 Cath: elev R heart pressures, nl cors.  . Gastroesophageal reflux disease   . Hyperlipidemia   . Cardioembolic stroke 04/8468    Right frontal in 01/2012; normal carotid ultrasound; possible LAA thrombus by TEE; virtual complete neurologic recovery  . Fasting hyperglycemia     120 fasting  . Chronic kidney disease, stage 2, mildly decreased GFR     GFR of approximately 60  . Breast carcinoma 1995    1995  . Depression   . Diverticulosis of colon (without mention of hemorrhage) 2012    Dr. Laural Golden  . Gastroparesis   . Hemorrhoids   . Anemia   . Shortness of breath   . Hypertension     pt denies 05/30/13     Dr Kathe Mariner  . Atrial fibrillation   . Hyponatremia      Allergies  Allergen Reactions  . Amiodarone Rash     Current Outpatient Prescriptions  Medication Sig Dispense Refill  . albuterol (VENTOLIN HFA) 108 (90 BASE)  MCG/ACT inhaler Inhale 2 puffs into the lungs every 4 (four) hours as needed for wheezing.      Marland Kitchen atorvastatin (LIPITOR) 20 MG tablet Take 20 mg by mouth daily.      . beclomethasone (QVAR) 80 MCG/ACT inhaler Inhale 2 puffs into the lungs 2 (two) times daily.  1 Inhaler  6  . buPROPion (WELLBUTRIN XL) 150 MG 24 hr tablet Take 150 mg by mouth daily.        Marland Kitchen diltiazem (CARDIZEM CD) 180 MG 24 hr capsule Take 1 capsule (180 mg total) by mouth daily.  30 capsule  1  . fexofenadine (ALLEGRA) 180 MG tablet Take 180 mg by mouth daily.      . fluticasone (FLONASE) 50 MCG/ACT nasal spray Place 1 spray into both nostrils daily. prn  16 g  2  . furosemide (LASIX) 40 MG tablet Take 1 tablet (40 mg total) by mouth 2 (two) times daily.  180 tablet  3  . HYDROMET 5-1.5 MG/5ML syrup Take 5 mLs by mouth daily.      Marland Kitchen levothyroxine (SYNTHROID, LEVOTHROID) 112 MCG tablet Take 1 tablet by mouth daily.      . metFORMIN (GLUCOPHAGE) 500 MG tablet Take 1 tablet (500 mg total) by mouth 2 (two) times daily with a meal.  60 tablet  1  . metoprolol tartrate (  LOPRESSOR) 25 MG tablet Take 1 tablet (25 mg total) by mouth 2 (two) times daily.  180 tablet  3  . Multiple Vitamin (MULITIVITAMIN WITH MINERALS) TABS Take 1 tablet by mouth daily.      . nitroGLYCERIN (NITROSTAT) 0.4 MG SL tablet Place 1 tablet (0.4 mg total) under the tongue every 5 (five) minutes as needed for chest pain.  90 tablet  3  . omeprazole (PRILOSEC) 20 MG capsule Take 20 mg by mouth 2 (two) times daily before a meal.       . potassium chloride SA (K-DUR,KLOR-CON) 20 MEQ tablet Take 1 tablet (20 mEq total) by mouth daily.  30 tablet  0  . saccharomyces boulardii (FLORASTOR) 250 MG capsule Take 250 mg by mouth daily.      . solifenacin (VESICARE) 5 MG tablet Take 5 mg by mouth daily.      Marland Kitchen warfarin (COUMADIN) 2.5 MG tablet Take coumadin 2 tablets daily or as directed  60 tablet  6   No current facility-administered medications for this visit.     Past  Surgical History  Procedure Laterality Date  . Mitral valve surgery  [facility], closed mitral valvulotomy by finger fracture  . Cardiac catheterization    . Dilation and curettage of uterus    . Breast lumpectomy Right 1995  . Tubal ligation  1973  . Tee without cardioversion  01/24/2012    Procedure: TRANSESOPHAGEAL ECHOCARDIOGRAM (TEE);  Surgeon: Lelon Perla, MD;  Location: [facility] ENDOSCOPY;  Service: Cardiovascular;  Laterality: N/A;  . Colonoscopy  2012    Negative screening procedure  . Esophageal manometry N/A 06/17/2013    Procedure: ESOPHAGEAL MANOMETRY (EM);  Surgeon: Sable Feil, MD;  Location: [facility] ENDOSCOPY;  Service: Endoscopy;  Laterality: N/A;  . Tee without cardioversion N/A 07/11/2013    Procedure: TRANSESOPHAGEAL ECHOCARDIOGRAM (TEE);  Surgeon: Thayer Headings, MD;  Location: [place];  Service: Cardiovascular;  Laterality: N/A;  . Aortic valve replacement N/A 07/18/2013    Procedure: AORTIC VALVE REPLACEMENT (AVR);  Surgeon: Gaye Pollack, MD;  Location: [place];  Service: Open Heart Surgery;  Laterality: N/A;  . Intraoperative transesophageal echocardiogram N/A 07/18/2013    Procedure: INTRAOPERATIVE TRANSESOPHAGEAL ECHOCARDIOGRAM;  Surgeon: Gaye Pollack, MD;  Location: [facility] OR;  Service: Open Heart Surgery;  Laterality: N/A;  . Mitral valve replacement N/A 07/18/2013    Procedure: MITRAL VALVE (MV) REPLACEMENT;  Surgeon: Gaye Pollack, MD;  Location: [place] OR;  Service: Open Heart Surgery;  Laterality: N/A;  . Tee without cardioversion N/A 07/26/2013    Procedure: TRANSESOPHAGEAL ECHOCARDIOGRAM (TEE);  Surgeon: Fay Records, MD;  Location: [facility] ENDOSCOPY;  Service: Cardiovascular;  Laterality: N/A;  . Cardioversion N/A 07/26/2013    Procedure: CARDIOVERSION;  Surgeon: Fay Records, MD;  Location: [facility] ENDOSCOPY;  Service: Cardiovascular;  Laterality: N/A;     Allergies  Allergen Reactions  . Amiodarone Rash      Family History  Problem Relation Age of  Onset  . Heart disease Brother 54    MI  . Rheum arthritis Maternal Grandmother   . Asthma Maternal Grandfather   . Hypothyroidism Mother   . Diabetes Sister   . Cirrhosis Father   . Cervical cancer Sister     malignant neoplasm of cervix uteri     Social History Ms. Rothschild reports that she has never smoked. She has never used smokeless tobacco. Ms. Furness reports that she does not drink alcohol.  Review of Systems CONSTITUTIONAL: No weight loss, fever, chills, weakness or fatigue.  HEENT: Eyes: No visual loss, blurred vision, double vision or yellow sclerae.No hearing loss, sneezing, congestion, runny nose or sore throat.  SKIN: No rash or itching.  CARDIOVASCULAR: per HPI RESPIRATORY: No shortness of breath, cough or sputum.  GASTROINTESTINAL: No anorexia, nausea, vomiting or diarrhea. No abdominal pain or blood.  GENITOURINARY: No burning on urination, no polyuria NEUROLOGICAL: No headache, dizziness, syncope, paralysis, ataxia, numbness or tingling in the extremities. No change in bowel or bladder control.  MUSCULOSKELETAL: No muscle, back pain, joint pain or stiffness.  LYMPHATICS: No enlarged nodes. No history of splenectomy.  PSYCHIATRIC: No history of depression or anxiety.  ENDOCRINOLOGIC: No reports of sweating, cold or heat intolerance. No polyuria or polydipsia.  Marland Kitchen   Physical Examination p 63 bp 138/60 Wt 176 lbs BMI 32 Gen: resting comfortably, no acute distress HEENT: no scleral icterus, pupils equal round and reactive, no palptable cervical adenopathy,  CV: RRR, no m/r/g, no JVD Resp: Clear to auscultation bilaterally GI: abdomen is soft, non-tender, non-distended, normal bowel sounds, no hepatosplenomegaly MSK: extremities are warm, no edema.  Skin: warm, no rash Neuro:  no focal deficits Psych: appropriate affect   Diagnostic Studies     Assessment and Plan  1. Valvular heart disease  - history of mitral and aortic stenosis, s/p tissue valve  replacement of both valves  - doing well from a symptoms standpoint, last imaging 07/26/13 TEE describes normal functioning prosthetic valves  - doing well without significant symptoms.   2. Afib  - unclear how long she has had afib, was diagnosed postoperatively but she does have a history of severe mitral stenosis, left atrial enlargement, and prior cardioembolic stroke. I suspect likely was ongoing before her recent surgery.  - denies any significant symptoms, she is rate controlled today in clinic. Continue current meds  - hx of prior cardioembolic CVA, continue anticoag indefintitely  3. Preoperative evaluation - due to her prosthetic valves and hx of cardioembolic CVA she will need to be bridged off and on to coumadin for the surgery with lovenox. - there is no indication for prophylactic abx from cardiac standpoint -she tolerates >4METs without significant limitation. She is euvolemic in clinic today - recommend proceeding with surgery as planned, will arrange her lovenox bridge   F/u 6 months   Arnoldo Lenis, M.D., F.A.C.C.

## 2014-05-06 ENCOUNTER — Other Ambulatory Visit: Payer: Self-pay | Admitting: Pulmonary Disease

## 2014-05-06 ENCOUNTER — Other Ambulatory Visit: Payer: Self-pay | Admitting: Neurosurgery

## 2014-05-07 ENCOUNTER — Encounter (HOSPITAL_COMMUNITY): Payer: Self-pay | Admitting: Pharmacy Technician

## 2014-05-07 ENCOUNTER — Ambulatory Visit (INDEPENDENT_AMBULATORY_CARE_PROVIDER_SITE_OTHER): Payer: Medicare Other | Admitting: *Deleted

## 2014-05-07 DIAGNOSIS — I6350 Cerebral infarction due to unspecified occlusion or stenosis of unspecified cerebral artery: Secondary | ICD-10-CM

## 2014-05-07 DIAGNOSIS — Z5181 Encounter for therapeutic drug level monitoring: Secondary | ICD-10-CM

## 2014-05-07 DIAGNOSIS — I639 Cerebral infarction, unspecified: Secondary | ICD-10-CM

## 2014-05-07 DIAGNOSIS — I6340 Cerebral infarction due to embolism of unspecified cerebral artery: Secondary | ICD-10-CM

## 2014-05-07 DIAGNOSIS — Z7901 Long term (current) use of anticoagulants: Secondary | ICD-10-CM

## 2014-05-07 DIAGNOSIS — Z954 Presence of other heart-valve replacement: Secondary | ICD-10-CM

## 2014-05-07 DIAGNOSIS — I4891 Unspecified atrial fibrillation: Secondary | ICD-10-CM

## 2014-05-07 NOTE — Patient Instructions (Addendum)
Pt here to be bridged with lovenox for pending lumbar laminectomy on 8/11  8/5  Take last dose coumadin 8/6  No lovenox or coumadin 8/7  Lovenox 80mg  sq 7am & 7pm 8/8  Lovenox 80mg  sq 7am & 7pm 8/9  Lovenox 80mg  sq 7am & 7pm 8/10  Lovenox 80mg  sq 7am & no lovenox in pm 8/11  No lovenox in am -----surgery ---admit to hospital overnight  (coumadin 3.75mg ) 8/12  Lovenox 80mg  sq 7am & 7pm and coumadin 3.75mg  pm 8/13  Lovenox 80mg  sq 7am & 7pm and coumadin 3.75mg  pm 8/14  Lovenox 80mg  sq 7am &  INR check @ 9:00am at [facility] office unless MD order [place] 80mg  #20 syringes called in to [facility]

## 2014-05-08 NOTE — Progress Notes (Addendum)
Anesthesia Chart Review:  Patient is a 68 year old female scheduled for L4-5 laminectomy on 05/13/14 by Dr. Hal Neer.  Her PAT appointment is scheduled for 05/12/14.  I received a note of cardiac clearance yesterday.  History includes non-smoker, rheumatic heart disease s/p digital mitral commisurotomy via left thoracotomy at age 75 and MVR and AVR (both pericardial tissue valves) on 07/18/13, afib with unsuccessful cardioversion on 83/09/40, cardioembolic CVA, HTN, GERD, HLD, DM2, CKD stage II, breast cancer s/p right breast lumpectomy '95, depression, gastroparesis, diverticulosis, anemia, diverticulosis. Cardiologist is Dr. Harl Bowie who cleared her for surgery with recommendation of Lovenox bridging while off Coumadin--which his office was arranging. PCP is listed as Dr. [name].  TEE on 07/26/13 showed: LV is foreshortened but systolic function appears grossly normal. AV prosthesis opens well. No AI. Mild fixed plaque in the thoracic aorta. MV prosthesis moves wellTrace MR. Doppler: Peak gradient: 46mm Hg (D). No thrombus in LA. Patient is s/p ligation of appendage. No flow in this region. No PFO by color doppler. PV is normal. TV is normal with mild TR.  It does appear that an EKG was done at her 05/01/14 appointment with Dr. Harl Bowie.  He recorded her heart as RRR.  EKG on 07/18/13 showed: SR with first degree AVB, occasional PVCs, ST/T wave abnormality, consider lateral ischemia, prolonged QT. However, her last EKG on 07/26/13 showed afib, ST/T wave abnormality, consider inferior ischemia. I don't see an EKG since her failed cardioversion, so I plan for an EKG to be done at her PAT visit to confirm rhythm.    Cardiac cath on 07/10/13 (pre-MVR/AVR) showed: No angiographic evidence of CAD, elevated filling pressures.   Carotid duplex on 07/12/13 showed: Findings suggest 1-39% internal carotid artery stenosis bilaterally. There is evidence of elevated peak systolic internal carotid artery velocitites  bilaterally, however there is no evidence of significant plaque morphology to support 40-59% stenosis.   CXR on 02/12/14 showed: No acute cardiopulmonary abnormality seen.    Plan labs and EKG at her PAT visit.  [facility]/Anesthesiology Phone [phone] 05/08/2014 2:40 PM  Addendum: 05/12/2014 10:05 AM Preoperative labs noted. She held Coumadin 05/07/14 and Lovenox 05/11/14. PT/PTT WNL. EKG today showed SR, first degree AVB.  This is similar to prior EKG received from Dr. Willey Blade from 11/14/13.

## 2014-05-09 NOTE — Pre-Procedure Instructions (Signed)
ADAIA MATTHIES  05/09/2014   Your procedure is scheduled on:  Tuesday, Aug. 11th   Report to [facility] Admitting at 10:30 AM.  Call this number if you have problems the morning of surgery: [phone]   Remember:   Do not eat food or drink liquids after midnight Monday.   Take these medicines the morning of surgery with A SIP OF WATER: Levothyroxine, Metoprolol, Omeprazole, Pain medication.  Please use albuterol & Qvar inhalers.   Do not wear jewelry, make-up or nail polish.  Do not wear lotions, powders, or perfumes. You may NOT wear deodorant.   Men may shave face and neck.   Do not bring valuables to the hospital.  [facility] is not responsible for any belongings or valuables.               Contacts, dentures or bridgework may not be worn into surgery.  Leave suitcase in the car. After surgery it may be brought to your room.  For patients admitted to the hospital, discharge time is determined by your treatment team.                Name and phone number of your driver:    Special Instructions: "Preparing for Surgery" instruction sheet.   Please read over the following fact sheets that you were given: Pain Booklet, Coughing and Deep Breathing, MRSA Information and Surgical Site Infection Prevention

## 2014-05-12 ENCOUNTER — Encounter (HOSPITAL_COMMUNITY): Payer: Self-pay

## 2014-05-12 ENCOUNTER — Encounter (HOSPITAL_COMMUNITY)
Admission: RE | Admit: 2014-05-12 | Discharge: 2014-05-12 | Disposition: A | Discharge status: Home / Self Care | Payer: Medicare Other | Source: Ambulatory Visit | Attending: Neurosurgery | Admitting: Neurosurgery

## 2014-05-12 LAB — COMPREHENSIVE METABOLIC PANEL
ALBUMIN: 3.9 g/dL (ref 3.5–5.2)
ALT: 31 U/L (ref 0–35)
ANION GAP: 11 (ref 5–15)
AST: 25 U/L (ref 0–37)
Alkaline Phosphatase: 73 U/L (ref 39–117)
BUN: 15 mg/dL (ref 6–23)
CO2: 30 mEq/L (ref 19–32)
CREATININE: 0.89 mg/dL (ref 0.50–1.10)
Calcium: 9.5 mg/dL (ref 8.4–10.5)
Chloride: 102 mEq/L (ref 96–112)
GFR calc non Af Amer: 65 mL/min — ABNORMAL LOW (ref >90)
GFR, EST AFRICAN AMERICAN: 75 mL/min — AB (ref >90)
GLUCOSE: 103 mg/dL — AB (ref 70–99)
POTASSIUM: 4.5 meq/L (ref 3.7–5.3)
Sodium: 143 mEq/L (ref 137–147)
TOTAL PROTEIN: 7.6 g/dL (ref 6.0–8.3)
Total Bilirubin: 0.3 mg/dL (ref 0.3–1.2)

## 2014-05-12 LAB — CBC
HEMATOCRIT: 40.5 % (ref 36.0–46.0)
HEMOGLOBIN: 13.2 g/dL (ref 12.0–15.0)
MCH: 30.4 pg (ref 26.0–34.0)
MCHC: 32.6 g/dL (ref 30.0–36.0)
MCV: 93.3 fL (ref 78.0–100.0)
Platelets: 204 10*3/uL (ref 150–400)
RBC: 4.34 MIL/uL (ref 3.87–5.11)
RDW: 13.8 % (ref 11.5–15.5)
WBC: 8.7 10*3/uL (ref 4.0–10.5)

## 2014-05-12 LAB — SURGICAL PCR SCREEN
MRSA, PCR: POSITIVE — AB
Staphylococcus aureus: POSITIVE — AB

## 2014-05-12 LAB — APTT: APTT: 36 s (ref 24–37)

## 2014-05-12 LAB — PROTIME-INR
INR: 1.06 (ref 0.00–1.49)
PROTHROMBIN TIME: 13.8 s (ref 11.6–15.2)

## 2014-05-12 MED ORDER — CEFAZOLIN SODIUM-DEXTROSE 2-3 GM-% IV SOLR
2.0000 g | INTRAVENOUS | Status: AC
  Administered 2014-05-13: 2 g via INTRAVENOUS
  Filled 2014-05-12: qty 50

## 2014-05-12 MED ORDER — DEXAMETHASONE SODIUM PHOSPHATE 10 MG/ML IJ SOLN
10.0000 mg | INTRAMUSCULAR | Status: AC
  Administered 2014-05-13: 10 mg via INTRAVENOUS
  Filled 2014-05-12: qty 1

## 2014-05-12 NOTE — Progress Notes (Addendum)
Dr. Cyndia Bent did her heart surgery back in 2014.  LOV was Nov. 2014 - no problems since. Dr. Harl Bowie is her cardio - last saw him 05-07-2014.  His note is inside her chart under "cardiology". Dr. Willey Blade is her PCP  [phone])  She stated she just had EKG back in Feb and I have requested that. She has stopped her coumadin on 05-07-14 and Lovenox on 05-11-14.

## 2014-05-13 ENCOUNTER — Inpatient Hospital Stay (HOSPITAL_COMMUNITY)
Admission: RE | Admit: 2014-05-13 | Discharge: 2014-05-14 | DRG: 520 | Disposition: A | Discharge status: Home / Self Care | Payer: Medicare Other | Source: Ambulatory Visit | Attending: Neurosurgery | Admitting: Neurosurgery

## 2014-05-13 ENCOUNTER — Inpatient Hospital Stay (HOSPITAL_COMMUNITY): Payer: Medicare Other | Admitting: Anesthesiology

## 2014-05-13 ENCOUNTER — Encounter (HOSPITAL_COMMUNITY)
Admission: RE | Disposition: A | Discharge status: Home / Self Care | Payer: Self-pay | Source: Ambulatory Visit | Attending: Neurosurgery

## 2014-05-13 ENCOUNTER — Inpatient Hospital Stay (HOSPITAL_COMMUNITY): Payer: Medicare Other

## 2014-05-13 ENCOUNTER — Encounter (HOSPITAL_COMMUNITY): Payer: Medicare Other | Admitting: Vascular Surgery

## 2014-05-13 DIAGNOSIS — Z7901 Long term (current) use of anticoagulants: Secondary | ICD-10-CM | POA: Diagnosis not present

## 2014-05-13 DIAGNOSIS — Z888 Allergy status to other drugs, medicaments and biological substances status: Secondary | ICD-10-CM | POA: Diagnosis not present

## 2014-05-13 DIAGNOSIS — Z79899 Other long term (current) drug therapy: Secondary | ICD-10-CM

## 2014-05-13 DIAGNOSIS — E039 Hypothyroidism, unspecified: Secondary | ICD-10-CM | POA: Diagnosis present

## 2014-05-13 DIAGNOSIS — F329 Major depressive disorder, single episode, unspecified: Secondary | ICD-10-CM | POA: Diagnosis present

## 2014-05-13 DIAGNOSIS — Z954 Presence of other heart-valve replacement: Secondary | ICD-10-CM

## 2014-05-13 DIAGNOSIS — I739 Peripheral vascular disease, unspecified: Secondary | ICD-10-CM | POA: Diagnosis present

## 2014-05-13 DIAGNOSIS — F3289 Other specified depressive episodes: Secondary | ICD-10-CM | POA: Diagnosis present

## 2014-05-13 DIAGNOSIS — K219 Gastro-esophageal reflux disease without esophagitis: Secondary | ICD-10-CM | POA: Diagnosis present

## 2014-05-13 DIAGNOSIS — R7989 Other specified abnormal findings of blood chemistry: Secondary | ICD-10-CM | POA: Diagnosis present

## 2014-05-13 DIAGNOSIS — I4891 Unspecified atrial fibrillation: Secondary | ICD-10-CM | POA: Diagnosis present

## 2014-05-13 DIAGNOSIS — M7138 Other bursal cyst, other site: Secondary | ICD-10-CM

## 2014-05-13 DIAGNOSIS — E785 Hyperlipidemia, unspecified: Secondary | ICD-10-CM | POA: Diagnosis present

## 2014-05-13 DIAGNOSIS — D649 Anemia, unspecified: Secondary | ICD-10-CM | POA: Diagnosis present

## 2014-05-13 DIAGNOSIS — K3184 Gastroparesis: Secondary | ICD-10-CM | POA: Diagnosis present

## 2014-05-13 DIAGNOSIS — Z8673 Personal history of transient ischemic attack (TIA), and cerebral infarction without residual deficits: Secondary | ICD-10-CM | POA: Diagnosis not present

## 2014-05-13 DIAGNOSIS — I099 Rheumatic heart disease, unspecified: Secondary | ICD-10-CM | POA: Diagnosis present

## 2014-05-13 DIAGNOSIS — N182 Chronic kidney disease, stage 2 (mild): Secondary | ICD-10-CM | POA: Diagnosis present

## 2014-05-13 DIAGNOSIS — M7130 Other bursal cyst, unspecified site: Secondary | ICD-10-CM | POA: Diagnosis present

## 2014-05-13 DIAGNOSIS — I129 Hypertensive chronic kidney disease with stage 1 through stage 4 chronic kidney disease, or unspecified chronic kidney disease: Secondary | ICD-10-CM | POA: Diagnosis present

## 2014-05-13 HISTORY — PX: LUMBAR LAMINECTOMY/DECOMPRESSION MICRODISCECTOMY: SHX5026

## 2014-05-13 LAB — GLUCOSE, CAPILLARY
GLUCOSE-CAPILLARY: 130 mg/dL — AB (ref 70–99)
GLUCOSE-CAPILLARY: 170 mg/dL — AB (ref 70–99)
Glucose-Capillary: 119 mg/dL — ABNORMAL HIGH (ref 70–99)
Glucose-Capillary: 167 mg/dL — ABNORMAL HIGH (ref 70–99)

## 2014-05-13 SURGERY — LUMBAR LAMINECTOMY/DECOMPRESSION MICRODISCECTOMY 1 LEVEL
Anesthesia: General | Site: Back | Laterality: Right

## 2014-05-13 MED ORDER — FUROSEMIDE 40 MG PO TABS
40.0000 mg | ORAL_TABLET | Freq: Every day | ORAL | Status: DC
  Filled 2014-05-13: qty 1

## 2014-05-13 MED ORDER — PANTOPRAZOLE SODIUM 40 MG PO TBEC
40.0000 mg | DELAYED_RELEASE_TABLET | Freq: Every day | ORAL | Status: DC
  Administered 2014-05-13: 40 mg via ORAL
  Filled 2014-05-13: qty 1

## 2014-05-13 MED ORDER — NEOSTIGMINE METHYLSULFATE 10 MG/10ML IV SOLN
INTRAVENOUS | Status: DC | PRN
  Administered 2014-05-13: 4 mg via INTRAVENOUS

## 2014-05-13 MED ORDER — LACTATED RINGERS IV SOLN
INTRAVENOUS | Status: DC | PRN
  Administered 2014-05-13: 12:00:00 via INTRAVENOUS

## 2014-05-13 MED ORDER — SODIUM CHLORIDE 0.9 % IJ SOLN
INTRAMUSCULAR | Status: AC
  Filled 2014-05-13: qty 20

## 2014-05-13 MED ORDER — POTASSIUM CHLORIDE CRYS ER 20 MEQ PO TBCR
20.0000 meq | EXTENDED_RELEASE_TABLET | Freq: Every day | ORAL | Status: DC
  Filled 2014-05-13: qty 1

## 2014-05-13 MED ORDER — LEVOTHYROXINE SODIUM 112 MCG PO TABS
112.0000 ug | ORAL_TABLET | Freq: Every day | ORAL | Status: DC
  Administered 2014-05-14: 112 ug via ORAL
  Filled 2014-05-13 (×2): qty 1

## 2014-05-13 MED ORDER — ARTIFICIAL TEARS OP OINT
TOPICAL_OINTMENT | OPHTHALMIC | Status: DC | PRN
  Administered 2014-05-13: 1 via OPHTHALMIC

## 2014-05-13 MED ORDER — BACITRACIN ZINC 500 UNIT/GM EX OINT
TOPICAL_OINTMENT | CUTANEOUS | Status: DC | PRN
  Administered 2014-05-13: 1 via TOPICAL

## 2014-05-13 MED ORDER — METFORMIN HCL 500 MG PO TABS
500.0000 mg | ORAL_TABLET | Freq: Two times a day (BID) | ORAL | Status: DC
  Administered 2014-05-13 – 2014-05-14 (×2): 500 mg via ORAL
  Filled 2014-05-13 (×4): qty 1

## 2014-05-13 MED ORDER — FLUTICASONE PROPIONATE HFA 44 MCG/ACT IN AERO
1.0000 | INHALATION_SPRAY | Freq: Two times a day (BID) | RESPIRATORY_TRACT | Status: DC
  Administered 2014-05-13 – 2014-05-14 (×2): 1 via RESPIRATORY_TRACT
  Filled 2014-05-13: qty 10.6

## 2014-05-13 MED ORDER — INSULIN ASPART 100 UNIT/ML ~~LOC~~ SOLN
0.0000 [IU] | Freq: Every day | SUBCUTANEOUS | Status: DC
Start: 2014-05-13 — End: 2014-05-14

## 2014-05-13 MED ORDER — OXYCODONE HCL 5 MG PO TABS: 5.0000 mg | ORAL_TABLET | Freq: Once | ORAL | Status: DC | PRN

## 2014-05-13 MED ORDER — HYDROMORPHONE HCL PF 1 MG/ML IJ SOLN
INTRAMUSCULAR | Status: AC
  Filled 2014-05-13: qty 1

## 2014-05-13 MED ORDER — MUPIROCIN 2 % EX OINT
1.0000 "application " | TOPICAL_OINTMENT | Freq: Two times a day (BID) | CUTANEOUS | Status: DC
  Administered 2014-05-13: 1 via NASAL

## 2014-05-13 MED ORDER — ACETAMINOPHEN 325 MG PO TABS: 650.0000 mg | ORAL_TABLET | ORAL | Status: DC | PRN

## 2014-05-13 MED ORDER — ROCURONIUM BROMIDE 100 MG/10ML IV SOLN
INTRAVENOUS | Status: DC | PRN
  Administered 2014-05-13: 40 mg via INTRAVENOUS

## 2014-05-13 MED ORDER — POTASSIUM CHLORIDE IN NACL 20-0.45 MEQ/L-% IV SOLN
INTRAVENOUS | Status: DC
  Filled 2014-05-13 (×3): qty 1000

## 2014-05-13 MED ORDER — FENTANYL CITRATE 0.05 MG/ML IJ SOLN
INTRAMUSCULAR | Status: AC
  Filled 2014-05-13: qty 2

## 2014-05-13 MED ORDER — BUPROPION HCL ER (XL) 150 MG PO TB24
150.0000 mg | ORAL_TABLET | Freq: Every day | ORAL | Status: DC
  Administered 2014-05-13: 150 mg via ORAL
  Filled 2014-05-13 (×2): qty 1

## 2014-05-13 MED ORDER — PHENOL 1.4 % MT LIQD: 1.0000 | OROMUCOSAL | Status: DC | PRN

## 2014-05-13 MED ORDER — KETOROLAC TROMETHAMINE 30 MG/ML IJ SOLN
30.0000 mg | Freq: Four times a day (QID) | INTRAMUSCULAR | Status: DC
  Administered 2014-05-13 – 2014-05-14 (×3): 30 mg via INTRAVENOUS
  Filled 2014-05-13 (×7): qty 1

## 2014-05-13 MED ORDER — THROMBIN 5000 UNITS EX SOLR
CUTANEOUS | Status: DC | PRN
  Administered 2014-05-13 (×2): 5000 [IU] via TOPICAL

## 2014-05-13 MED ORDER — DEXAMETHASONE SODIUM PHOSPHATE 4 MG/ML IJ SOLN: 4.0000 mg | Freq: Four times a day (QID) | INTRAMUSCULAR | Status: AC

## 2014-05-13 MED ORDER — ACETAMINOPHEN 650 MG RE SUPP: 650.0000 mg | RECTAL | Status: DC | PRN

## 2014-05-13 MED ORDER — KETOROLAC TROMETHAMINE 30 MG/ML IJ SOLN
INTRAMUSCULAR | Status: AC
  Filled 2014-05-13: qty 1

## 2014-05-13 MED ORDER — LIDOCAINE HCL (CARDIAC) 20 MG/ML IV SOLN
INTRAVENOUS | Status: DC | PRN
  Administered 2014-05-13 (×2): 50 mg via INTRAVENOUS
  Administered 2014-05-13: 100 mg via INTRAVENOUS
  Administered 2014-05-13: 50 mg via INTRAVENOUS

## 2014-05-13 MED ORDER — INSULIN ASPART 100 UNIT/ML ~~LOC~~ SOLN
6.0000 [IU] | Freq: Three times a day (TID) | SUBCUTANEOUS | Status: DC
  Administered 2014-05-14: 6 [IU] via SUBCUTANEOUS

## 2014-05-13 MED ORDER — HYDROMORPHONE HCL PF 1 MG/ML IJ SOLN: 1.0000 mg | INTRAMUSCULAR | Status: DC | PRN

## 2014-05-13 MED ORDER — SODIUM CHLORIDE 0.9 % IV SOLN: 250.0000 mL | INTRAVENOUS | Status: DC

## 2014-05-13 MED ORDER — HEMOSTATIC AGENTS (NO CHARGE) OPTIME
TOPICAL | Status: DC | PRN
  Administered 2014-05-13: 1 via TOPICAL

## 2014-05-13 MED ORDER — KETOROLAC TROMETHAMINE 30 MG/ML IJ SOLN
INTRAMUSCULAR | Status: DC | PRN
  Administered 2014-05-13: 30 mg via INTRAVENOUS

## 2014-05-13 MED ORDER — PANTOPRAZOLE SODIUM 40 MG IV SOLR
40.0000 mg | Freq: Every day | INTRAVENOUS | Status: DC
  Filled 2014-05-13: qty 40

## 2014-05-13 MED ORDER — BUPIVACAINE HCL (PF) 0.5 % IJ SOLN
INTRAMUSCULAR | Status: DC | PRN
  Administered 2014-05-13: 20 mL

## 2014-05-13 MED ORDER — SODIUM CHLORIDE 0.9 % IR SOLN
Status: DC | PRN
  Administered 2014-05-13: 13:00:00

## 2014-05-13 MED ORDER — ALBUTEROL SULFATE (2.5 MG/3ML) 0.083% IN NEBU: 3.0000 mL | INHALATION_SOLUTION | RESPIRATORY_TRACT | Status: DC | PRN

## 2014-05-13 MED ORDER — CEFAZOLIN SODIUM-DEXTROSE 2-3 GM-% IV SOLR
2.0000 g | Freq: Three times a day (TID) | INTRAVENOUS | Status: AC
  Administered 2014-05-13 – 2014-05-14 (×2): 2 g via INTRAVENOUS
  Filled 2014-05-13 (×2): qty 50

## 2014-05-13 MED ORDER — 0.9 % SODIUM CHLORIDE (POUR BTL) OPTIME
TOPICAL | Status: DC | PRN
  Administered 2014-05-13: 1000 mL

## 2014-05-13 MED ORDER — FENTANYL CITRATE 0.05 MG/ML IJ SOLN
INTRAMUSCULAR | Status: DC | PRN
  Administered 2014-05-13: 100 ug via INTRAVENOUS

## 2014-05-13 MED ORDER — PROPOFOL 10 MG/ML IV BOLUS
INTRAVENOUS | Status: DC | PRN
  Administered 2014-05-13: 125 mg via INTRAVENOUS

## 2014-05-13 MED ORDER — GLYCOPYRROLATE 0.2 MG/ML IJ SOLN
INTRAMUSCULAR | Status: DC | PRN
Start: 2014-05-13 — End: 2014-05-13
  Administered 2014-05-13: .8 mg via INTRAVENOUS

## 2014-05-13 MED ORDER — EPHEDRINE SULFATE 50 MG/ML IJ SOLN
INTRAMUSCULAR | Status: AC
  Filled 2014-05-13: qty 1

## 2014-05-13 MED ORDER — FENTANYL CITRATE 0.05 MG/ML IJ SOLN
25.0000 ug | INTRAMUSCULAR | Status: DC | PRN
  Administered 2014-05-13 (×3): 50 ug via INTRAVENOUS

## 2014-05-13 MED ORDER — ONDANSETRON HCL 4 MG/2ML IJ SOLN: 4.0000 mg | Freq: Once | INTRAMUSCULAR | Status: DC | PRN

## 2014-05-13 MED ORDER — OXYCODONE HCL 5 MG/5ML PO SOLN: 5.0000 mg | Freq: Once | ORAL | Status: DC | PRN

## 2014-05-13 MED ORDER — FENTANYL CITRATE 0.05 MG/ML IJ SOLN
INTRAMUSCULAR | Status: AC
  Filled 2014-05-13: qty 5

## 2014-05-13 MED ORDER — MENTHOL 3 MG MT LOZG: 1.0000 | LOZENGE | OROMUCOSAL | Status: DC | PRN

## 2014-05-13 MED ORDER — NITROGLYCERIN 0.4 MG SL SUBL: 0.4000 mg | SUBLINGUAL_TABLET | SUBLINGUAL | Status: DC | PRN

## 2014-05-13 MED ORDER — OXYCODONE-ACETAMINOPHEN 5-325 MG PO TABS
1.0000 | ORAL_TABLET | ORAL | Status: DC | PRN
  Administered 2014-05-13: 2 via ORAL
  Filled 2014-05-13: qty 2

## 2014-05-13 MED ORDER — LACTATED RINGERS IV SOLN
INTRAVENOUS | Status: DC
  Administered 2014-05-13: 11:00:00 via INTRAVENOUS

## 2014-05-13 MED ORDER — METOPROLOL TARTRATE 25 MG PO TABS
25.0000 mg | ORAL_TABLET | Freq: Two times a day (BID) | ORAL | Status: DC
  Administered 2014-05-13: 25 mg via ORAL
  Filled 2014-05-13 (×3): qty 1

## 2014-05-13 MED ORDER — FLUTICASONE PROPIONATE 50 MCG/ACT NA SUSP
1.0000 | Freq: Every day | NASAL | Status: DC
  Filled 2014-05-13: qty 16

## 2014-05-13 MED ORDER — ONDANSETRON HCL 4 MG/2ML IJ SOLN
INTRAMUSCULAR | Status: DC | PRN
  Administered 2014-05-13: 4 mg via INTRAVENOUS

## 2014-05-13 MED ORDER — SODIUM CHLORIDE 0.9 % IJ SOLN: 3.0000 mL | Freq: Two times a day (BID) | INTRAMUSCULAR | Status: DC

## 2014-05-13 MED ORDER — NEOSTIGMINE METHYLSULFATE 10 MG/10ML IV SOLN
INTRAVENOUS | Status: AC
  Filled 2014-05-13: qty 1

## 2014-05-13 MED ORDER — ONDANSETRON HCL 4 MG/2ML IJ SOLN: 4.0000 mg | INTRAMUSCULAR | Status: DC | PRN

## 2014-05-13 MED ORDER — DILTIAZEM HCL ER COATED BEADS 180 MG PO CP24
180.0000 mg | ORAL_CAPSULE | Freq: Every day | ORAL | Status: DC
  Administered 2014-05-13: 180 mg via ORAL
  Filled 2014-05-13 (×2): qty 1

## 2014-05-13 MED ORDER — ENOXAPARIN SODIUM 80 MG/0.8ML ~~LOC~~ SOLN
80.0000 mg | Freq: Two times a day (BID) | SUBCUTANEOUS | Status: DC
  Administered 2014-05-14: 80 mg via SUBCUTANEOUS
  Filled 2014-05-13 (×3): qty 0.8

## 2014-05-13 MED ORDER — CHLORHEXIDINE GLUCONATE CLOTH 2 % EX PADS
6.0000 | MEDICATED_PAD | Freq: Every day | CUTANEOUS | Status: DC
  Administered 2014-05-14: 6 via TOPICAL

## 2014-05-13 MED ORDER — DEXAMETHASONE 4 MG PO TABS
4.0000 mg | ORAL_TABLET | Freq: Four times a day (QID) | ORAL | Status: AC
  Administered 2014-05-13 (×2): 4 mg via ORAL
  Filled 2014-05-13 (×2): qty 1

## 2014-05-13 MED ORDER — SODIUM CHLORIDE 0.9 % IJ SOLN: 3.0000 mL | INTRAMUSCULAR | Status: DC | PRN

## 2014-05-13 MED ORDER — INSULIN ASPART 100 UNIT/ML ~~LOC~~ SOLN
0.0000 [IU] | Freq: Three times a day (TID) | SUBCUTANEOUS | Status: DC
  Administered 2014-05-13: 4 [IU] via SUBCUTANEOUS
  Administered 2014-05-14: 11 [IU] via SUBCUTANEOUS

## 2014-05-13 SURGICAL SUPPLY — 49 items
BAG DECANTER FOR FLEXI CONT (MISCELLANEOUS) ×2 IMPLANT
BENZOIN TINCTURE PRP APPL 2/3 (GAUZE/BANDAGES/DRESSINGS) ×2 IMPLANT
BLADE SURG ROTATE 9660 (MISCELLANEOUS) ×2 IMPLANT
BRUSH SCRUB EZ PLAIN DRY (MISCELLANEOUS) ×2 IMPLANT
BUR CUTTER 7.0 ROUND (BURR) ×2 IMPLANT
BUR MATCHSTICK NEURO 3.0 LAGG (BURR) ×2 IMPLANT
CANISTER SUCT 3000ML (MISCELLANEOUS) ×2 IMPLANT
CONT SPEC 4OZ CLIKSEAL STRL BL (MISCELLANEOUS) ×2 IMPLANT
DERMABOND ADHESIVE PROPEN (GAUZE/BANDAGES/DRESSINGS) ×1
DERMABOND ADVANCED (GAUZE/BANDAGES/DRESSINGS) ×1
DERMABOND ADVANCED .7 DNX12 (GAUZE/BANDAGES/DRESSINGS) ×1 IMPLANT
DERMABOND ADVANCED .7 DNX6 (GAUZE/BANDAGES/DRESSINGS) ×1 IMPLANT
DRAPE LAPAROTOMY 100X72X124 (DRAPES) ×2 IMPLANT
DRAPE MICROSCOPE LEICA (MISCELLANEOUS) ×2 IMPLANT
DRAPE SURG 17X23 STRL (DRAPES) ×4 IMPLANT
DRSG TELFA 3X8 NADH (GAUZE/BANDAGES/DRESSINGS) ×2 IMPLANT
DURAPREP 26ML APPLICATOR (WOUND CARE) ×2 IMPLANT
ELECT REM PT RETURN 9FT ADLT (ELECTROSURGICAL) ×2
ELECTRODE REM PT RTRN 9FT ADLT (ELECTROSURGICAL) ×1 IMPLANT
GAUZE SPONGE 4X4 12PLY STRL (GAUZE/BANDAGES/DRESSINGS) ×2 IMPLANT
GAUZE SPONGE 4X4 16PLY XRAY LF (GAUZE/BANDAGES/DRESSINGS) IMPLANT
GLOVE BIO SURGEON STRL SZ8 (GLOVE) ×2 IMPLANT
GLOVE BIOGEL PI IND STRL 7.5 (GLOVE) ×1 IMPLANT
GLOVE BIOGEL PI IND STRL 8.5 (GLOVE) ×1 IMPLANT
GLOVE BIOGEL PI INDICATOR 7.5 (GLOVE) ×1
GLOVE BIOGEL PI INDICATOR 8.5 (GLOVE) ×1
GLOVE ECLIPSE 8.0 STRL XLNG CF (GLOVE) ×4 IMPLANT
GOWN STRL REUS W/ TWL LRG LVL3 (GOWN DISPOSABLE) IMPLANT
GOWN STRL REUS W/ TWL XL LVL3 (GOWN DISPOSABLE) ×4 IMPLANT
GOWN STRL REUS W/TWL 2XL LVL3 (GOWN DISPOSABLE) IMPLANT
GOWN STRL REUS W/TWL LRG LVL3 (GOWN DISPOSABLE)
GOWN STRL REUS W/TWL XL LVL3 (GOWN DISPOSABLE) ×4
KIT BASIN OR (CUSTOM PROCEDURE TRAY) ×2 IMPLANT
KIT ROOM TURNOVER OR (KITS) ×2 IMPLANT
NEEDLE HYPO 22GX1.5 SAFETY (NEEDLE) ×2 IMPLANT
NEEDLE SPNL 22GX3.5 QUINCKE BK (NEEDLE) ×4 IMPLANT
NS IRRIG 1000ML POUR BTL (IV SOLUTION) ×2 IMPLANT
PACK LAMINECTOMY NEURO (CUSTOM PROCEDURE TRAY) ×2 IMPLANT
PAD ARMBOARD 7.5X6 YLW CONV (MISCELLANEOUS) ×6 IMPLANT
PATTIES SURGICAL .75X.75 (GAUZE/BANDAGES/DRESSINGS) ×2 IMPLANT
RUBBERBAND STERILE (MISCELLANEOUS) ×4 IMPLANT
SPONGE SURGIFOAM ABS GEL SZ50 (HEMOSTASIS) ×2 IMPLANT
STRIP CLOSURE SKIN 1/2X4 (GAUZE/BANDAGES/DRESSINGS) ×2 IMPLANT
SUT VIC AB 2-0 OS6 18 (SUTURE) ×6 IMPLANT
SUT VIC AB 3-0 CP2 18 (SUTURE) ×2 IMPLANT
SYR 20ML ECCENTRIC (SYRINGE) ×2 IMPLANT
TOWEL OR 17X24 6PK STRL BLUE (TOWEL DISPOSABLE) ×2 IMPLANT
TOWEL OR 17X26 10 PK STRL BLUE (TOWEL DISPOSABLE) ×2 IMPLANT
WATER STERILE IRR 1000ML POUR (IV SOLUTION) ×2 IMPLANT

## 2014-05-13 NOTE — Transfer of Care (Signed)
Immediate Anesthesia Transfer of Care Note  Patient: [name]  Procedure(s) Performed: Procedure(s): LUMBAR LAMINECTOMY/DECOMPRESSION MICRODISCECTOMY 1 LEVEL  lumbar four/five (Right)  Patient Location: PACU  Anesthesia Type:General  Level of Consciousness: awake, alert  and oriented  Airway & Oxygen Therapy: Patient Spontanous Breathing and Patient connected to face mask oxygen  Post-op Assessment: Report given to PACU RN, Post -op Vital signs reviewed and stable and Patient moving all extremities X 4  Post vital signs: Reviewed and stable  Complications: No apparent anesthesia complications

## 2014-05-13 NOTE — Anesthesia Preprocedure Evaluation (Addendum)
Anesthesia Evaluation  Patient identified by MRN, date of birth, ID band Patient awake    Reviewed: Allergy & Precautions, H&P , NPO status , Patient's Chart, lab work & pertinent test results  Airway Mallampati: II TM Distance: >3 FB Neck ROM: Full    Dental  (+) Teeth Intact, Dental Advidsory Given   Pulmonary shortness of breath, asthma ,  breath sounds clear to auscultation        Cardiovascular hypertension, + Peripheral Vascular Disease Rhythm:regular Rate:Normal     Neuro/Psych PSYCHIATRIC DISORDERS CVA, No Residual Symptoms    GI/Hepatic   Endo/Other  Hypothyroidism   Renal/GU      Musculoskeletal   Abdominal   Peds  Hematology  (+) anemia ,   Anesthesia Other Findings   Reproductive/Obstetrics                          Anesthesia Physical Anesthesia Plan  ASA: III  Anesthesia Plan: General   Post-op Pain Management:    Induction: Intravenous  Airway Management Planned: Oral ETT  Additional Equipment:   Intra-op Plan:   Post-operative Plan:   Informed Consent:   Dental Advisory Given  Plan Discussed with: Anesthesiologist, CRNA and Surgeon  Anesthesia Plan Comments:        Anesthesia Quick Evaluation

## 2014-05-13 NOTE — Anesthesia Postprocedure Evaluation (Signed)
  Anesthesia Post-op Note  Patient: [name]  Procedure(s) Performed: Procedure(s): LUMBAR LAMINECTOMY/DECOMPRESSION MICRODISCECTOMY 1 LEVEL  lumbar four/five (Right)  Patient Location: PACU  Anesthesia Type:General  Level of Consciousness: awake, alert  and oriented  Airway and Oxygen Therapy: Patient Spontanous Breathing and Patient connected to nasal cannula oxygen  Post-op Pain: mild  Post-op Assessment: Post-op Vital signs reviewed, Patient's Cardiovascular Status Stable, Respiratory Function Stable, Patent Airway and Pain level controlled  Post-op Vital Signs: stable  Last Vitals:  Filed Vitals:   05/13/14 1507  BP:   Pulse: 74  Temp: 36.5 C  Resp: 17    Complications: No apparent anesthesia complications

## 2014-05-13 NOTE — Op Note (Signed)
Preop diagnosis: Synovial cyst L4-5 right Postop diagnosis: Same Procedure: Right L4-5 intralaminar laminotomy for removal of synovial cyst and decompression of L5 nerve root Surgeon: Kaysia Willard Assistant: Vertell Limber  After being placed the prone position the patient's back was prepped and draped in the usual sterile fashion. Localizing x-rays taken prior to incision to identify the appropriate level. Midline incision was made above the spinous processes of L4 and L5. Using Bovie cutting current the incision was carried out spinous processes. Subperiosteal dissection was then carried out on the right side the spinous processes and lamina and subcutaneous tract was placed for exposure. X-ray showed approach the appropriate level. Using the high-speed drill the inferior one third of the L4 lamina the medial one third of the facet joint the superior one third of the L5 lamina were removed. Residual bone was removed in a piecemeal fashion. Partial removal of the ligamentum flavum was carried out. The microscope was draped brought into the field and used for the remainder of the case. Using microdissection technique to dissect the ligamentum flavum off of the thecal sac and nerve root. There is a large cystic the mass compressing the thecal sac and nerve root and this was dissected free and removed. Very generous decompression was carried out. At this point we evaluated the disc and found not to be herniated and a need of incision. We then irrigated the wound copiously controlled any bleeders proper coagulation Gelfoam. We then reevaluated the thecal sac and nerve root once more for a two-level decompressed. We placed Gelfoam over the dural exposure and then closed the wound in multiple layers of Vicryl on the muscle fascia subcutaneous and subcuticular tissues. Dermabond Steri-Strips were placed on the skin. Shortness was applied and the patient was extubated and taken to recovery in stable condition.

## 2014-05-13 NOTE — H&P (Signed)
[name] is an 68 y.o. female.   Chief Complaint: Right hip and leg pain HPI: The patient is a 68 year old female who was evaluated in the office right hip and leg pain of 8 weeks duration. There is no inciting event and Dr. today she saw her medical doctor. She was treated for bursitis got no improvement is an orthopedist. She's tried oxycodone without relief. An MRI scan of the lumbar spine was performed and she came for evaluation. When seen in the office she said her left leg was isn't dramatic. Her scan was reviewed which showed a synovial cyst on the right with marked neural compression at L4-5. The patient was tried on conservative therapy still without improvement. She therefore requested surgery now comes for a right L4-5 decompression with removal of the synovial cyst. I've had a long discussion with her regarding the risks and benefits of surgical intervention. The risks discussed include but are not limited to bleeding infection weakness numbness paralysis spinal fluid leak coma and death. We've discussed alternative methods of therapy offered risks and benefits of nonintervention. She's had the opportunity to ask numerous questions and appears to understand. With this information in hand she has requested to proceed with surgery.  Past Medical History  Diagnosis Date  . Rheumatic heart disease     a. 07/2013 Echo: Ef 55-60%, Mod AS/AI, Mod-Sev MS, sev dil LA, PASP 58;  b. 07/2013 TEE: EF 35-40%, mild-mod AS/AI, mod-sev MS, LA smoke;  c. 07/2013 Cath: elev R heart pressures, nl cors.  . Gastroesophageal reflux disease   . Hyperlipidemia   . Cardioembolic stroke 01/2594    Right frontal in 01/2012; normal carotid ultrasound; possible LAA thrombus by TEE; virtual complete neurologic recovery  . Fasting hyperglycemia     120 fasting  . Chronic kidney disease, stage 2, mildly decreased GFR     GFR of approximately 60  . Breast carcinoma 1995    1995  . Depression   . Diverticulosis  of colon (without mention of hemorrhage) 2012    Dr. Laural Golden  . Gastroparesis   . Hemorrhoids   . Anemia   . Shortness of breath   . Hypertension     pt denies 05/30/13     Dr Kathe Mariner  . Atrial fibrillation   . Hyponatremia     Past Surgical History  Procedure Laterality Date  . Mitral valve surgery  [facility], closed mitral valvulotomy by finger fracture  . Cardiac catheterization    . Dilation and curettage of uterus    . Breast lumpectomy Right 1995  . Tubal ligation  1973  . Tee without cardioversion  01/24/2012    Procedure: TRANSESOPHAGEAL ECHOCARDIOGRAM (TEE);  Surgeon: Lelon Perla, MD;  Location: [facility] ENDOSCOPY;  Service: Cardiovascular;  Laterality: N/A;  . Colonoscopy  2012    Negative screening procedure  . Esophageal manometry N/A 06/17/2013    Procedure: ESOPHAGEAL MANOMETRY (EM);  Surgeon: Sable Feil, MD;  Location: [facility] ENDOSCOPY;  Service: Endoscopy;  Laterality: N/A;  . Tee without cardioversion N/A 07/11/2013    Procedure: TRANSESOPHAGEAL ECHOCARDIOGRAM (TEE);  Surgeon: Thayer Headings, MD;  Location: [place];  Service: Cardiovascular;  Laterality: N/A;  . Aortic valve replacement N/A 07/18/2013    Procedure: AORTIC VALVE REPLACEMENT (AVR);  Surgeon: Gaye Pollack, MD;  Location: [place];  Service: Open Heart Surgery;  Laterality: N/A;  . Intraoperative transesophageal echocardiogram N/A 07/18/2013    Procedure: INTRAOPERATIVE TRANSESOPHAGEAL ECHOCARDIOGRAM;  Surgeon: Gaye Pollack, MD;  Location: [place];  Service: Open Heart Surgery;  Laterality: N/A;  . Mitral valve replacement N/A 07/18/2013    Procedure: MITRAL VALVE (MV) REPLACEMENT;  Surgeon: Gaye Pollack, MD;  Location: [place] [facility];  Service: Open Heart Surgery;  Laterality: N/A;  . Tee without cardioversion N/A 07/26/2013    Procedure: TRANSESOPHAGEAL ECHOCARDIOGRAM (TEE);  Surgeon: Fay Records, MD;  Location: [facility] ENDOSCOPY;  Service: Cardiovascular;  Laterality: N/A;  . Cardioversion  N/A 07/26/2013    Procedure: CARDIOVERSION;  Surgeon: Fay Records, MD;  Location: [facility] ENDOSCOPY;  Service: Cardiovascular;  Laterality: N/A;    Family History  Problem Relation Age of Onset  . Heart disease Brother 24    MI  . Rheum arthritis Maternal Grandmother   . Asthma Maternal Grandfather   . Hypothyroidism Mother   . Diabetes Sister   . Cirrhosis Father   . Cervical cancer Sister     malignant neoplasm of cervix uteri   Social History:  reports that she has never smoked. She has never used smokeless tobacco. She reports that she does not drink alcohol or use illicit drugs.  Allergies:  Allergies  Allergen Reactions  . Amiodarone Rash    Medications Prior to Admission  Medication Sig Dispense Refill  . atorvastatin (LIPITOR) 20 MG tablet Take 20 mg by mouth daily.      . beclomethasone (QVAR) 80 MCG/ACT inhaler Inhale 2 puffs into the lungs 2 (two) times daily. Pt needs appt for more refills  8.7 g  0  . buPROPion (WELLBUTRIN XL) 150 MG 24 hr tablet Take 150 mg by mouth daily.        Marland Kitchen diltiazem (CARDIZEM CD) 180 MG 24 hr capsule Take 1 capsule (180 mg total) by mouth daily.  30 capsule  1  . enoxaparin (LOVENOX) 80 MG/0.8ML injection Inject 80 mg into the skin every 12 (twelve) hours.      . fexofenadine (ALLEGRA) 180 MG tablet Take 180 mg by mouth daily.      . fluticasone (FLONASE) 50 MCG/ACT nasal spray Place 1 spray into both nostrils daily.      . furosemide (LASIX) 40 MG tablet Take 40 mg by mouth daily.      Marland Kitchen levothyroxine (SYNTHROID, LEVOTHROID) 112 MCG tablet Take 1 tablet by mouth daily.      . metFORMIN (GLUCOPHAGE) 500 MG tablet Take 1 tablet (500 mg total) by mouth 2 (two) times daily with a meal.  60 tablet  1  . metoprolol tartrate (LOPRESSOR) 25 MG tablet Take 1 tablet (25 mg total) by mouth 2 (two) times daily.  180 tablet  3  . Multiple Vitamin (MULITIVITAMIN WITH MINERALS) TABS Take 1 tablet by mouth daily.      Marland Kitchen omeprazole (PRILOSEC) 20 MG capsule  Take 20 mg by mouth 2 (two) times daily before a meal.       . oxyCODONE-acetaminophen (PERCOCET/ROXICET) 5-325 MG per tablet Take 1 tablet by mouth every 4 (four) hours as needed for severe pain (for pain).      . potassium chloride SA (K-DUR,KLOR-CON) 20 MEQ tablet Take 20 mEq by mouth daily.      Marland Kitchen saccharomyces boulardii (FLORASTOR) 250 MG capsule Take 250 mg by mouth daily.      Marland Kitchen warfarin (COUMADIN) 2.5 MG tablet Take 2.5-3.75 mg by mouth daily at 6 PM. 2.5 mg every day except 3.75 mg on Tuesday      . albuterol (VENTOLIN HFA) 108 (  90 BASE) MCG/ACT inhaler Inhale 2 puffs into the lungs every 4 (four) hours as needed for wheezing or shortness of breath.       . nitroGLYCERIN (NITROSTAT) 0.4 MG SL tablet Place 1 tablet (0.4 mg total) under the tongue every 5 (five) minutes as needed for chest pain.  90 tablet  3    Results for orders placed during the hospital encounter of 05/13/14 (from the past 48 hour(s))  GLUCOSE, CAPILLARY     Status: Abnormal   Collection Time    05/13/14 10:29 AM      Result Value Ref Range   Glucose-Capillary 119 (*) 70 - 99 mg/dL   No results found.  Positive for nasal congestion sinus problems heart murmur high cholesterol mild asthma shortness of breath urinary tract infections incontinence diabetes thyroid disease anxiety problems with her memory  Blood pressure 179/70, pulse 65, temperature 98.4 F (36.9 C), temperature source Oral, resp. rate 16, height 5\' 2"  (1.575 m), weight 77.111 kg (170 lb), SpO2 99.00%.  Patient is awake or and oriented with no facial asymmetry. Her gait is slow mildly antalgic. She has absent reflexes. She has slightly decreased extensor pollicis longus strength on the right. Her sensation is mildly decreased Patel. Assessment/Plan Impression is that of a synovial cyst L4-5 on the right. The plan is for a right L4-5 decompression with removal.  Faythe Ghee, MD 05/13/2014, 12:10 PM

## 2014-05-14 LAB — GLUCOSE, CAPILLARY: GLUCOSE-CAPILLARY: 282 mg/dL — AB (ref 70–99)

## 2014-05-14 NOTE — Progress Notes (Signed)
Pt and husband given D/C instructions with verbal understanding of teaching. Pt's dressing was removed by Dr. Hal Neer, incision started draining minimally, gauze dressing was applied prior to D/C. Pt's IV was removed prior to D/C. Pt D/C'd home via wheelchair @ 0935 per MD order. Holli Humbles, RN

## 2014-05-14 NOTE — Discharge Summary (Signed)
Physician Discharge Summary  Patient ID: [name] MRN: [id number] DOB/AGE: 1946-06-30 68 y.o.  Admit date: 05/13/2014 Discharge date: 05/14/2014  Admission Diagnoses:  Discharge Diagnoses:  Active Problems:   Synovial cyst of lumbar spine   Discharged Condition: good  Hospital Course: surgery one day for removal of synovial cyst. Did well. Marked improvement in pain. Ambulated well. Home pod 1, specific instructions given.  Consults: None  Significant Diagnostic Studies: none  Treatments: surgery: L 78 lam for synovial cyst  Discharge Exam: Blood pressure 101/57, pulse 74, temperature 98.9 F (37.2 C), temperature source Oral, resp. rate 16, height 5\' 2"  (1.575 m), weight 77.111 kg (170 lb), SpO2 96.00%. Incision/Wound:clean and dry; no new neuro issues  Disposition: 01-Home or Self Care     Medication List    ASK your doctor about these medications       atorvastatin 20 MG tablet  Commonly known as:  LIPITOR  Take 20 mg by mouth daily.     beclomethasone 80 MCG/ACT inhaler  Commonly known as:  QVAR  Inhale 2 puffs into the lungs 2 (two) times daily. Pt needs appt for more refills     buPROPion 150 MG 24 hr tablet  Commonly known as:  WELLBUTRIN XL  Take 150 mg by mouth daily.     diltiazem 180 MG 24 hr capsule  Commonly known as:  CARDIZEM CD  Take 1 capsule (180 mg total) by mouth daily.     enoxaparin 80 MG/0.8ML injection  Commonly known as:  LOVENOX  Inject 80 mg into the skin every 12 (twelve) hours.     fexofenadine 180 MG tablet  Commonly known as:  ALLEGRA  Take 180 mg by mouth daily.     fluticasone 50 MCG/ACT nasal spray  Commonly known as:  FLONASE  Place 1 spray into both nostrils daily.     furosemide 40 MG tablet  Commonly known as:  LASIX  Take 40 mg by mouth daily.     levothyroxine 112 MCG tablet  Commonly known as:  SYNTHROID, LEVOTHROID  Take 1 tablet by mouth daily.     metFORMIN 500 MG tablet  Commonly known as:   GLUCOPHAGE  Take 1 tablet (500 mg total) by mouth 2 (two) times daily with a meal.     metoprolol tartrate 25 MG tablet  Commonly known as:  LOPRESSOR  Take 1 tablet (25 mg total) by mouth 2 (two) times daily.     multivitamin with minerals Tabs tablet  Take 1 tablet by mouth daily.     nitroGLYCERIN 0.4 MG SL tablet  Commonly known as:  NITROSTAT  Place 1 tablet (0.4 mg total) under the tongue every 5 (five) minutes as needed for chest pain.     omeprazole 20 MG capsule  Commonly known as:  PRILOSEC  Take 20 mg by mouth 2 (two) times daily before a meal.     oxyCODONE-acetaminophen 5-325 MG per tablet  Commonly known as:  PERCOCET/ROXICET  Take 1 tablet by mouth every 4 (four) hours as needed for severe pain (for pain).     potassium chloride SA 20 MEQ tablet  Commonly known as:  K-DUR,KLOR-CON  Take 20 mEq by mouth daily.     saccharomyces boulardii 250 MG capsule  Commonly known as:  FLORASTOR  Take 250 mg by mouth daily.     VENTOLIN HFA 108 (90 BASE) MCG/ACT inhaler  Generic drug:  albuterol  Inhale 2 puffs into the lungs every 4 (four)  hours as needed for wheezing or shortness of breath.     warfarin 2.5 MG tablet  Commonly known as:  COUMADIN  Take 2.5-3.75 mg by mouth daily at 6 PM. 2.5 mg every day except 3.75 mg on Tuesday         At home rest most of the time. Get up 9 or 10 times each day and take a 15 or 20 minute walk. No riding in the car and to your first postoperative appointment. If you have neck surgery you may shower from the chest down starting on the third postoperative day. If you had back surgery he may start showering on the third postoperative day with saran wrap wrapped around your incisional area 3 times. After the shower remove the saran wrap. Take pain medicine as needed and other medications as instructed. Call my office for an appointment.  SignedFaythe Ghee, MD 05/14/2014, 8:42 AM

## 2014-05-14 NOTE — Discharge Instructions (Signed)
Laminectomy  During a laminectomy, small pieces of bone in the spine called lamina are removed. The ligaments underneath the lamina and parts of the joints that have grown too large are also removed. This takes pressure off the nerves.   LET YOUR HEALTH CARE PROVIDER KNOW ABOUT:  · Any allergies you have.  · All medicines you are taking, including vitamins, herbs, eye drops, creams, and over-the-counter medicines.  · Previous problems you or members of your family have had with the use of anesthetics.  · Any blood disorders you have.  · Previous surgeries you have had.  · Medical conditions you have.  RISKS AND COMPLICATIONS   Generally, laminectomy is a safe procedure. However, as with any procedure, complications can occur. Possible complications include:  · Infection near the incision.  · Nerve damage. Signs of this can be pain, weakness, or numbness.  · Leaking of spinal fluid.  · Blood clot in a leg. The clot can move to the lungs. This can be very serious.  · Bowel or bladder incontinence (rare).  BEFORE THE PROCEDURE   · You will need to stop taking certain medicines as directed by your health care provider.  · If you smoke, stop at least 2 weeks before the procedure. Smoking can slow down the healing process and increase the risk of complications.  · Do not eat or drink anything for at least 8 hours before the procedure. Take any medicines that your health care provider tells you to keep taking with a sip of water.  · Do not drink alcohol the day before your surgery.  · Tell your health care provider if you develop a cold or any infection before your surgery.  · Arrange for someone to drive you home after the procedure or after your hospital stay. Also arrange for someone to help you with activities during recovery.  PROCEDURE  · Small monitors will be placed on your body. They are used to check your heart, blood pressure, and oxygen level.  · An IV tube will be inserted into one of your veins. Medicine will  flow directly into your body through the IV tube.  · You might be given a sedative. This will help you relax.  · You will be given a medicine to make you sleep (general anesthetic), and a breathing tube will be placed into your lungs. During general anesthesia, you are unaware of the procedure and do not feel any pain.  · Your back will be cleaned with a special solution to kill germs on your skin.  · Once you are asleep, the surgeon will make a 2-inch to 5-inch cut (incision) in your back. The length of the incision will depend on how many spinal bones (vertebrae) are being operated on.  · Muscles in the back will be moved away from the vertebrae and pulled to the side.  · Pieces of lamina will be removed.  · The ligament that lies under the lamina and connects your vertebrae will be removed.  · Enough ligaments and thickened joints will be removed to take pressure off your nerves.  · Your nerves will be identified, and their passage will be tracked and assessed for excessive tightness.  · Your back muscles will be moved back into their normal position.  · The area under your skin will be closed with small, absorbable stitches. These stitches do not need to be removed.  · Your skin will be closed with small absorbable stitches or staples.  ·   pulse will be checked every so often. Then you will be taken to a hospital room.  You may continue to get fluids through the IV tube for a while.  Some pain is normal. You may be given pain medicine while still in the recovery area.  It is important to be up and moving as soon as possible after a surgery. Physical therapists will help you start walking.  To prevent blood clots in your legs:  You may be given special stockings to wear.  You may need to take medicine to  prevent clots.  You may be asked to do special breathing exercises to re-expand your lungs. This is to prevent a lung infection.  Most people stay in the hospital for 1-3 days after a laminectomy. Document Released: 09/07/2009 Document Revised: 07/10/2013 Document Reviewed: 05/01/2013 [facility] Patient Information 2015 [place], [place]. This information is not intended to replace advice given to you by your health care provider. Make sure you discuss any questions you have with your health care provider.    Wound Care Keep incision covered and dry for one week. You may shower from the neck down. You may remove outer bandage after one week.  Do not put any creams, lotions, or ointments on incision. Leave steri-strips on neck.  They will fall off by themselves. Activity Walk each and every day, increasing distance each day. No lifting greater than 5 lbs.  Avoid bending, arching, and twisting. No driving or riding in car until further notice at follow up appointment. If provided with back brace, wear when out of bed.  It is not necessary to wear brace in bed. Diet Resume your normal diet.  Return to Work Will be discussed at you follow up appointment. Call Your Doctor If Any of These Occur Redness, drainage, or swelling at the wound.  Temperature greater than 101 degrees. Severe pain not relieved by pain medication. Incision starts to come apart. Follow Up Appt Call today for appointment in 1-2 weeks ([phone]) or for problems.  If you have any hardware placed in your spine, you will need an x-ray before your appointment.

## 2014-05-15 ENCOUNTER — Encounter (HOSPITAL_COMMUNITY): Payer: Self-pay | Admitting: Neurosurgery

## 2014-05-16 ENCOUNTER — Ambulatory Visit (INDEPENDENT_AMBULATORY_CARE_PROVIDER_SITE_OTHER): Payer: Medicare Other | Admitting: *Deleted

## 2014-05-16 DIAGNOSIS — I6350 Cerebral infarction due to unspecified occlusion or stenosis of unspecified cerebral artery: Secondary | ICD-10-CM

## 2014-05-16 DIAGNOSIS — I6340 Cerebral infarction due to embolism of unspecified cerebral artery: Secondary | ICD-10-CM

## 2014-05-16 DIAGNOSIS — Z954 Presence of other heart-valve replacement: Secondary | ICD-10-CM

## 2014-05-16 DIAGNOSIS — I639 Cerebral infarction, unspecified: Secondary | ICD-10-CM

## 2014-05-16 DIAGNOSIS — Z7901 Long term (current) use of anticoagulants: Secondary | ICD-10-CM

## 2014-05-16 DIAGNOSIS — Z5181 Encounter for therapeutic drug level monitoring: Secondary | ICD-10-CM

## 2014-05-16 LAB — POCT INR: INR: 1.1

## 2014-05-19 ENCOUNTER — Ambulatory Visit (INDEPENDENT_AMBULATORY_CARE_PROVIDER_SITE_OTHER): Payer: Medicare Other | Admitting: *Deleted

## 2014-05-19 DIAGNOSIS — I6340 Cerebral infarction due to embolism of unspecified cerebral artery: Secondary | ICD-10-CM

## 2014-05-19 DIAGNOSIS — Z5181 Encounter for therapeutic drug level monitoring: Secondary | ICD-10-CM

## 2014-05-19 DIAGNOSIS — I6350 Cerebral infarction due to unspecified occlusion or stenosis of unspecified cerebral artery: Secondary | ICD-10-CM

## 2014-05-19 DIAGNOSIS — Z954 Presence of other heart-valve replacement: Secondary | ICD-10-CM

## 2014-05-19 DIAGNOSIS — Z7901 Long term (current) use of anticoagulants: Secondary | ICD-10-CM

## 2014-05-19 DIAGNOSIS — I639 Cerebral infarction, unspecified: Secondary | ICD-10-CM

## 2014-05-19 DIAGNOSIS — I4891 Unspecified atrial fibrillation: Secondary | ICD-10-CM

## 2014-05-19 LAB — POCT INR: INR: 1.6

## 2014-05-19 MED ORDER — ENOXAPARIN SODIUM 80 MG/0.8ML ~~LOC~~ SOLN: 80.0000 mg | Freq: Two times a day (BID) | SUBCUTANEOUS | Status: DC

## 2014-05-22 ENCOUNTER — Ambulatory Visit (INDEPENDENT_AMBULATORY_CARE_PROVIDER_SITE_OTHER): Payer: Medicare Other | Admitting: *Deleted

## 2014-05-22 DIAGNOSIS — I6350 Cerebral infarction due to unspecified occlusion or stenosis of unspecified cerebral artery: Secondary | ICD-10-CM

## 2014-05-22 DIAGNOSIS — I4891 Unspecified atrial fibrillation: Secondary | ICD-10-CM

## 2014-05-22 DIAGNOSIS — Z7901 Long term (current) use of anticoagulants: Secondary | ICD-10-CM

## 2014-05-22 DIAGNOSIS — I639 Cerebral infarction, unspecified: Secondary | ICD-10-CM

## 2014-05-22 DIAGNOSIS — Z5181 Encounter for therapeutic drug level monitoring: Secondary | ICD-10-CM

## 2014-05-22 DIAGNOSIS — I6340 Cerebral infarction due to embolism of unspecified cerebral artery: Secondary | ICD-10-CM

## 2014-05-22 DIAGNOSIS — Z954 Presence of other heart-valve replacement: Secondary | ICD-10-CM

## 2014-05-22 LAB — POCT INR: INR: 2

## 2014-05-23 ENCOUNTER — Ambulatory Visit: Payer: Medicare Other | Admitting: Cardiology

## 2014-05-26 ENCOUNTER — Ambulatory Visit (INDEPENDENT_AMBULATORY_CARE_PROVIDER_SITE_OTHER): Payer: Medicare Other | Admitting: *Deleted

## 2014-05-26 DIAGNOSIS — Z5181 Encounter for therapeutic drug level monitoring: Secondary | ICD-10-CM

## 2014-05-26 DIAGNOSIS — I639 Cerebral infarction, unspecified: Secondary | ICD-10-CM

## 2014-05-26 DIAGNOSIS — Z954 Presence of other heart-valve replacement: Secondary | ICD-10-CM

## 2014-05-26 DIAGNOSIS — I6350 Cerebral infarction due to unspecified occlusion or stenosis of unspecified cerebral artery: Secondary | ICD-10-CM

## 2014-05-26 DIAGNOSIS — I4891 Unspecified atrial fibrillation: Secondary | ICD-10-CM

## 2014-05-26 DIAGNOSIS — Z7901 Long term (current) use of anticoagulants: Secondary | ICD-10-CM

## 2014-05-26 DIAGNOSIS — I6340 Cerebral infarction due to embolism of unspecified cerebral artery: Secondary | ICD-10-CM

## 2014-05-26 LAB — POCT INR: INR: 2.1

## 2014-06-10 ENCOUNTER — Ambulatory Visit (INDEPENDENT_AMBULATORY_CARE_PROVIDER_SITE_OTHER): Payer: Medicare Other | Admitting: *Deleted

## 2014-06-10 ENCOUNTER — Other Ambulatory Visit: Payer: Self-pay | Admitting: *Deleted

## 2014-06-10 DIAGNOSIS — Z7901 Long term (current) use of anticoagulants: Secondary | ICD-10-CM

## 2014-06-10 DIAGNOSIS — I639 Cerebral infarction, unspecified: Secondary | ICD-10-CM

## 2014-06-10 DIAGNOSIS — Z5181 Encounter for therapeutic drug level monitoring: Secondary | ICD-10-CM

## 2014-06-10 DIAGNOSIS — I4891 Unspecified atrial fibrillation: Secondary | ICD-10-CM

## 2014-06-10 DIAGNOSIS — I6350 Cerebral infarction due to unspecified occlusion or stenosis of unspecified cerebral artery: Secondary | ICD-10-CM

## 2014-06-10 DIAGNOSIS — I6340 Cerebral infarction due to embolism of unspecified cerebral artery: Secondary | ICD-10-CM

## 2014-06-10 DIAGNOSIS — Z954 Presence of other heart-valve replacement: Secondary | ICD-10-CM

## 2014-06-10 LAB — POCT INR: INR: 1.4

## 2014-06-10 MED ORDER — ENOXAPARIN SODIUM 80 MG/0.8ML ~~LOC~~ SOLN: 80.0000 mg | Freq: Two times a day (BID) | SUBCUTANEOUS | Status: DC

## 2014-06-12 ENCOUNTER — Ambulatory Visit (INDEPENDENT_AMBULATORY_CARE_PROVIDER_SITE_OTHER): Payer: Medicare Other | Admitting: *Deleted

## 2014-06-12 DIAGNOSIS — Z954 Presence of other heart-valve replacement: Secondary | ICD-10-CM

## 2014-06-12 DIAGNOSIS — Z7901 Long term (current) use of anticoagulants: Secondary | ICD-10-CM

## 2014-06-12 DIAGNOSIS — I6340 Cerebral infarction due to embolism of unspecified cerebral artery: Secondary | ICD-10-CM

## 2014-06-12 DIAGNOSIS — Z5181 Encounter for therapeutic drug level monitoring: Secondary | ICD-10-CM

## 2014-06-12 DIAGNOSIS — I6350 Cerebral infarction due to unspecified occlusion or stenosis of unspecified cerebral artery: Secondary | ICD-10-CM

## 2014-06-12 DIAGNOSIS — I4891 Unspecified atrial fibrillation: Secondary | ICD-10-CM

## 2014-06-12 DIAGNOSIS — I639 Cerebral infarction, unspecified: Secondary | ICD-10-CM

## 2014-06-12 LAB — POCT INR: INR: 1.8

## 2014-06-13 ENCOUNTER — Ambulatory Visit (INDEPENDENT_AMBULATORY_CARE_PROVIDER_SITE_OTHER): Payer: Medicare Other | Admitting: *Deleted

## 2014-06-13 DIAGNOSIS — Z7901 Long term (current) use of anticoagulants: Secondary | ICD-10-CM

## 2014-06-13 DIAGNOSIS — I6350 Cerebral infarction due to unspecified occlusion or stenosis of unspecified cerebral artery: Secondary | ICD-10-CM

## 2014-06-13 DIAGNOSIS — Z954 Presence of other heart-valve replacement: Secondary | ICD-10-CM

## 2014-06-13 DIAGNOSIS — Z5181 Encounter for therapeutic drug level monitoring: Secondary | ICD-10-CM

## 2014-06-13 DIAGNOSIS — I4891 Unspecified atrial fibrillation: Secondary | ICD-10-CM

## 2014-06-13 DIAGNOSIS — I639 Cerebral infarction, unspecified: Secondary | ICD-10-CM

## 2014-06-13 DIAGNOSIS — I6340 Cerebral infarction due to embolism of unspecified cerebral artery: Secondary | ICD-10-CM

## 2014-06-13 LAB — POCT INR: INR: 2.5

## 2014-06-16 ENCOUNTER — Ambulatory Visit (INDEPENDENT_AMBULATORY_CARE_PROVIDER_SITE_OTHER): Payer: Medicare Other | Admitting: *Deleted

## 2014-06-16 DIAGNOSIS — Z7901 Long term (current) use of anticoagulants: Secondary | ICD-10-CM

## 2014-06-16 DIAGNOSIS — I6350 Cerebral infarction due to unspecified occlusion or stenosis of unspecified cerebral artery: Secondary | ICD-10-CM

## 2014-06-16 DIAGNOSIS — I6340 Cerebral infarction due to embolism of unspecified cerebral artery: Secondary | ICD-10-CM

## 2014-06-16 DIAGNOSIS — I639 Cerebral infarction, unspecified: Secondary | ICD-10-CM

## 2014-06-16 DIAGNOSIS — I4891 Unspecified atrial fibrillation: Secondary | ICD-10-CM

## 2014-06-16 DIAGNOSIS — Z954 Presence of other heart-valve replacement: Secondary | ICD-10-CM

## 2014-06-16 DIAGNOSIS — Z5181 Encounter for therapeutic drug level monitoring: Secondary | ICD-10-CM

## 2014-06-16 LAB — POCT INR: INR: 2.9

## 2014-06-23 ENCOUNTER — Ambulatory Visit (INDEPENDENT_AMBULATORY_CARE_PROVIDER_SITE_OTHER): Payer: Medicare Other | Admitting: *Deleted

## 2014-06-23 DIAGNOSIS — Z954 Presence of other heart-valve replacement: Secondary | ICD-10-CM

## 2014-06-23 DIAGNOSIS — I4891 Unspecified atrial fibrillation: Secondary | ICD-10-CM

## 2014-06-23 DIAGNOSIS — I639 Cerebral infarction, unspecified: Secondary | ICD-10-CM

## 2014-06-23 DIAGNOSIS — Z5181 Encounter for therapeutic drug level monitoring: Secondary | ICD-10-CM

## 2014-06-23 DIAGNOSIS — Z7901 Long term (current) use of anticoagulants: Secondary | ICD-10-CM

## 2014-06-23 DIAGNOSIS — I6350 Cerebral infarction due to unspecified occlusion or stenosis of unspecified cerebral artery: Secondary | ICD-10-CM

## 2014-06-23 DIAGNOSIS — I6340 Cerebral infarction due to embolism of unspecified cerebral artery: Secondary | ICD-10-CM

## 2014-06-23 LAB — POCT INR: INR: 2.4

## 2014-07-03 ENCOUNTER — Ambulatory Visit (INDEPENDENT_AMBULATORY_CARE_PROVIDER_SITE_OTHER): Payer: Medicare Other | Admitting: *Deleted

## 2014-07-03 DIAGNOSIS — I4891 Unspecified atrial fibrillation: Secondary | ICD-10-CM

## 2014-07-03 DIAGNOSIS — I639 Cerebral infarction, unspecified: Secondary | ICD-10-CM

## 2014-07-03 DIAGNOSIS — Z7901 Long term (current) use of anticoagulants: Secondary | ICD-10-CM

## 2014-07-03 DIAGNOSIS — Z5181 Encounter for therapeutic drug level monitoring: Secondary | ICD-10-CM

## 2014-07-03 LAB — POCT INR: INR: 3.8

## 2014-07-07 ENCOUNTER — Emergency Department (HOSPITAL_COMMUNITY): Payer: Medicare Other

## 2014-07-07 ENCOUNTER — Emergency Department (HOSPITAL_COMMUNITY)
Admission: EM | Admit: 2014-07-07 | Discharge: 2014-07-07 | Disposition: A | Discharge status: Home / Self Care | Payer: Medicare Other | Attending: Emergency Medicine | Admitting: Emergency Medicine

## 2014-07-07 ENCOUNTER — Encounter (HOSPITAL_COMMUNITY): Payer: Self-pay | Admitting: Emergency Medicine

## 2014-07-07 DIAGNOSIS — D6832 Hemorrhagic disorder due to extrinsic circulating anticoagulants: Secondary | ICD-10-CM | POA: Diagnosis not present

## 2014-07-07 DIAGNOSIS — Z8719 Personal history of other diseases of the digestive system: Secondary | ICD-10-CM | POA: Diagnosis not present

## 2014-07-07 DIAGNOSIS — E785 Hyperlipidemia, unspecified: Secondary | ICD-10-CM | POA: Insufficient documentation

## 2014-07-07 DIAGNOSIS — T45515A Adverse effect of anticoagulants, initial encounter: Secondary | ICD-10-CM | POA: Diagnosis not present

## 2014-07-07 DIAGNOSIS — I4891 Unspecified atrial fibrillation: Secondary | ICD-10-CM | POA: Insufficient documentation

## 2014-07-07 DIAGNOSIS — N182 Chronic kidney disease, stage 2 (mild): Secondary | ICD-10-CM | POA: Insufficient documentation

## 2014-07-07 DIAGNOSIS — Z7901 Long term (current) use of anticoagulants: Secondary | ICD-10-CM | POA: Insufficient documentation

## 2014-07-07 DIAGNOSIS — I129 Hypertensive chronic kidney disease with stage 1 through stage 4 chronic kidney disease, or unspecified chronic kidney disease: Secondary | ICD-10-CM | POA: Insufficient documentation

## 2014-07-07 DIAGNOSIS — R531 Weakness: Secondary | ICD-10-CM

## 2014-07-07 DIAGNOSIS — Z9889 Other specified postprocedural states: Secondary | ICD-10-CM | POA: Diagnosis not present

## 2014-07-07 DIAGNOSIS — Z853 Personal history of malignant neoplasm of breast: Secondary | ICD-10-CM | POA: Diagnosis not present

## 2014-07-07 DIAGNOSIS — Z8673 Personal history of transient ischemic attack (TIA), and cerebral infarction without residual deficits: Secondary | ICD-10-CM | POA: Insufficient documentation

## 2014-07-07 DIAGNOSIS — Z79899 Other long term (current) drug therapy: Secondary | ICD-10-CM | POA: Insufficient documentation

## 2014-07-07 DIAGNOSIS — F329 Major depressive disorder, single episode, unspecified: Secondary | ICD-10-CM | POA: Insufficient documentation

## 2014-07-07 DIAGNOSIS — Z7951 Long term (current) use of inhaled steroids: Secondary | ICD-10-CM | POA: Diagnosis not present

## 2014-07-07 DIAGNOSIS — D649 Anemia, unspecified: Secondary | ICD-10-CM | POA: Diagnosis not present

## 2014-07-07 LAB — DIFFERENTIAL
Basophils Absolute: 0.1 10*3/uL (ref 0.0–0.1)
Basophils Relative: 1 % (ref 0–1)
EOS ABS: 0.2 10*3/uL (ref 0.0–0.7)
EOS PCT: 2 % (ref 0–5)
Lymphocytes Relative: 14 % (ref 12–46)
Lymphs Abs: 1.5 10*3/uL (ref 0.7–4.0)
MONOS PCT: 7 % (ref 3–12)
Monocytes Absolute: 0.8 10*3/uL (ref 0.1–1.0)
Neutro Abs: 8.2 10*3/uL — ABNORMAL HIGH (ref 1.7–7.7)
Neutrophils Relative %: 76 % (ref 43–77)

## 2014-07-07 LAB — CBC
HCT: 36 % (ref 36.0–46.0)
HEMOGLOBIN: 11.8 g/dL — AB (ref 12.0–15.0)
MCH: 28.9 pg (ref 26.0–34.0)
MCHC: 32.8 g/dL (ref 30.0–36.0)
MCV: 88.2 fL (ref 78.0–100.0)
Platelets: 375 10*3/uL (ref 150–400)
RBC: 4.08 MIL/uL (ref 3.87–5.11)
RDW: 14 % (ref 11.5–15.5)
WBC: 10.7 10*3/uL — ABNORMAL HIGH (ref 4.0–10.5)

## 2014-07-07 LAB — RAPID URINE DRUG SCREEN, HOSP PERFORMED
Amphetamines: NOT DETECTED
BENZODIAZEPINES: NOT DETECTED
Barbiturates: NOT DETECTED
Cocaine: NOT DETECTED
OPIATES: NOT DETECTED
Tetrahydrocannabinol: NOT DETECTED

## 2014-07-07 LAB — URINALYSIS, ROUTINE W REFLEX MICROSCOPIC
Bilirubin Urine: NEGATIVE
GLUCOSE, UA: NEGATIVE mg/dL
Hgb urine dipstick: NEGATIVE
KETONES UR: NEGATIVE mg/dL
LEUKOCYTES UA: NEGATIVE
Nitrite: NEGATIVE
Protein, ur: NEGATIVE mg/dL
Specific Gravity, Urine: <1.005 — ABNORMAL LOW (ref 1.005–1.030)
Urobilinogen, UA: 0.2 mg/dL (ref 0.0–1.0)
pH: 7 (ref 5.0–8.0)

## 2014-07-07 LAB — COMPREHENSIVE METABOLIC PANEL
ALK PHOS: 97 U/L (ref 39–117)
ALT: 34 U/L (ref 0–35)
AST: 24 U/L (ref 0–37)
Albumin: 3.3 g/dL — ABNORMAL LOW (ref 3.5–5.2)
Anion gap: 16 — ABNORMAL HIGH (ref 5–15)
BUN: 13 mg/dL (ref 6–23)
CALCIUM: 9.6 mg/dL (ref 8.4–10.5)
CO2: 25 mEq/L (ref 19–32)
CREATININE: 0.84 mg/dL (ref 0.50–1.10)
Chloride: 98 mEq/L (ref 96–112)
GFR calc non Af Amer: 69 mL/min — ABNORMAL LOW (ref >90)
GFR, EST AFRICAN AMERICAN: 80 mL/min — AB (ref >90)
Glucose, Bld: 113 mg/dL — ABNORMAL HIGH (ref 70–99)
POTASSIUM: 3.5 meq/L — AB (ref 3.7–5.3)
Sodium: 139 mEq/L (ref 137–147)
TOTAL PROTEIN: 8.3 g/dL (ref 6.0–8.3)
Total Bilirubin: 0.2 mg/dL — ABNORMAL LOW (ref 0.3–1.2)

## 2014-07-07 LAB — APTT: aPTT: 63 seconds — ABNORMAL HIGH (ref 24–37)

## 2014-07-07 LAB — ETHANOL: Alcohol, Ethyl (B): <11 mg/dL (ref 0–11)

## 2014-07-07 LAB — PROTIME-INR
INR: 2.65 — ABNORMAL HIGH (ref 0.00–1.49)
Prothrombin Time: 28.3 seconds — ABNORMAL HIGH (ref 11.6–15.2)

## 2014-07-07 LAB — CBG MONITORING, ED: GLUCOSE-CAPILLARY: 125 mg/dL — AB (ref 70–99)

## 2014-07-07 LAB — TROPONIN I: Troponin I: <0.3 ng/mL (ref <0.30)

## 2014-07-07 NOTE — ED Provider Notes (Signed)
CSN: [id number]     Arrival date & time 07/07/14  1844 History   First MD Initiated Contact with Patient 07/07/14 1852     No chief complaint on file.    (Consider location/radiation/quality/duration/timing/severity/associated sxs/prior Treatment) HPI Family reports about an hour and a half prior to arrival to the ED patient had some unusual behavior. They report [place] and today she was on the golf cart path and she did not know where she should go. She also was having trouble using the gas pedal on the golf car. She also was having difficulty opening up her drink and when her daughter opened it opened easily. They report about 1820 she said she had used the bathroom and she went into the woods to urinate. However after she squatted she was unable to get back up. She states both her legs felt weak however her daughter felt like her right lower extremity had the toe sticking out laterally. She had normal speech. She was having difficulty walking. She denies any headache, numbness in her extremities. Family states patient had a stroke 3 years ago and she has short-term memory loss. She is currently on Coumadin and had her INR checked a week ago and it was 3.8. They changed her Coumadin from 5 mg 4 times a day to 3 times a day, and her Coumadin 2.5 mg from 3 times a day to 4 times a day. Patient is right-handed.  PCP Dr Willey Blade Cardiologist Dr Harl Bowie   Past Medical History  Diagnosis Date  . Rheumatic heart disease     a. 07/2013 Echo: Ef 55-60%, Mod AS/AI, Mod-Sev MS, sev dil LA, PASP 58;  b. 07/2013 TEE: EF 35-40%, mild-mod AS/AI, mod-sev MS, LA smoke;  c. 07/2013 Cath: elev R heart pressures, nl cors.  . Gastroesophageal reflux disease   . Hyperlipidemia   . Cardioembolic stroke 03/2375    Right frontal in 01/2012; normal carotid ultrasound; possible LAA thrombus by TEE; virtual complete neurologic recovery  . Fasting hyperglycemia     120 fasting  . Breast  carcinoma 1995    1995  . Depression   . Diverticulosis of colon (without mention of hemorrhage) 2012    Dr. Laural Golden  . Gastroparesis   . Hemorrhoids   . Anemia   . Shortness of breath   . Hypertension     pt denies 05/30/13     Dr Kathe Mariner  . Atrial fibrillation   . Hyponatremia   . Chronic kidney disease, stage 2, mildly decreased GFR     GFR of approximately 60   Past Surgical History  Procedure Laterality Date  . Mitral valve surgery  [facility], closed mitral valvulotomy by finger fracture  . Cardiac catheterization    . Dilation and curettage of uterus    . Breast lumpectomy Right 1995  . Tubal ligation  1973  . Tee without cardioversion  01/24/2012    Procedure: TRANSESOPHAGEAL ECHOCARDIOGRAM (TEE);  Surgeon: Lelon Perla, MD;  Location: [facility] ENDOSCOPY;  Service: Cardiovascular;  Laterality: N/A;  . Colonoscopy  2012    Negative screening procedure  . Esophageal manometry N/A 06/17/2013    Procedure: ESOPHAGEAL MANOMETRY (EM);  Surgeon: Sable Feil, MD;  Location: [facility] ENDOSCOPY;  Service: Endoscopy;  Laterality: N/A;  . Tee without cardioversion N/A 07/11/2013    Procedure: TRANSESOPHAGEAL ECHOCARDIOGRAM (TEE);  Surgeon: Thayer Headings, MD;  Location: [place];  Service: Cardiovascular;  Laterality:  N/A;  . Aortic valve replacement N/A 07/18/2013    Procedure: AORTIC VALVE REPLACEMENT (AVR);  Surgeon: Gaye Pollack, MD;  Location: [place];  Service: Open Heart Surgery;  Laterality: N/A;  . Intraoperative transesophageal echocardiogram N/A 07/18/2013    Procedure: INTRAOPERATIVE TRANSESOPHAGEAL ECHOCARDIOGRAM;  Surgeon: Gaye Pollack, MD;  Location: [facility] OR;  Service: Open Heart Surgery;  Laterality: N/A;  . Mitral valve replacement N/A 07/18/2013    Procedure: MITRAL VALVE (MV) REPLACEMENT;  Surgeon: Gaye Pollack, MD;  Location: [place] OR;  Service: Open Heart Surgery;  Laterality: N/A;  . Tee without cardioversion N/A 07/26/2013    Procedure:  TRANSESOPHAGEAL ECHOCARDIOGRAM (TEE);  Surgeon: Fay Records, MD;  Location: [place];  Service: Cardiovascular;  Laterality: N/A;  . Cardioversion N/A 07/26/2013    Procedure: CARDIOVERSION;  Surgeon: Fay Records, MD;  Location: [facility] ENDOSCOPY;  Service: Cardiovascular;  Laterality: N/A;  . Lumbar laminectomy/decompression microdiscectomy Right 05/13/2014    Procedure: LUMBAR LAMINECTOMY/DECOMPRESSION MICRODISCECTOMY 1 LEVEL  lumbar four/five;  Surgeon: Faythe Ghee, MD;  Location: [facility] NEURO ORS;  Service: Neurosurgery;  Laterality: Right;  . Back surgery     Family History  Problem Relation Age of Onset  . Heart disease Brother 64    MI  . Rheum arthritis Maternal Grandmother   . Asthma Maternal Grandfather   . Hypothyroidism Mother   . Diabetes Sister   . Cirrhosis Father   . Cervical cancer Sister     malignant neoplasm of cervix uteri   History  Substance Use Topics  . Smoking status: Never Smoker   . Smokeless tobacco: Never Used  . Alcohol Use: No   Lives at home Lives with spouse  OB History   Grav Para Term Preterm Abortions TAB SAB Ect Mult Living                 Review of Systems  All other systems reviewed and are negative.     Allergies  Amiodarone  Home Medications   Prior to Admission medications   Medication Sig Start Date End Date Taking? Authorizing Provider  atorvastatin (LIPITOR) 20 MG tablet Take 20 mg by mouth every evening.    Yes Historical Provider, MD  beclomethasone (QVAR) 80 MCG/ACT inhaler Inhale 2 puffs into the lungs 2 (two) times daily. Pt needs appt for more refills 05/06/14  Yes Kathee Delton, MD  buPROPion (WELLBUTRIN XL) 150 MG 24 hr tablet Take 150 mg by mouth daily.     Yes Historical Provider, MD  diltiazem (CARDIZEM CD) 180 MG 24 hr capsule Take 1 capsule (180 mg total) by mouth daily. 08/03/13  Yes Gina L Collins, PA-C  fluticasone (FLONASE) 50 MCG/ACT nasal spray Place 1 spray into both nostrils daily as needed.  09/05/13   Yes Arnoldo Lenis, MD  furosemide (LASIX) 40 MG tablet Take 40 mg by mouth daily. *May take additional tablet at bedtime as needed for swelling 12/05/13  Yes Satira Sark, MD  levothyroxine (SYNTHROID, LEVOTHROID) 112 MCG tablet Take 1 tablet by mouth daily. 10/17/13  Yes Historical Provider, MD  metFORMIN (GLUCOPHAGE) 500 MG tablet Take 1 tablet (500 mg total) by mouth 2 (two) times daily with a meal. 08/03/13  Yes Gina L Collins, PA-C  metoprolol tartrate (LOPRESSOR) 25 MG tablet Take 1 tablet (25 mg total) by mouth 2 (two) times daily. 12/05/13  Yes Arnoldo Lenis, MD  Multiple Vitamin (MULITIVITAMIN WITH MINERALS) TABS Take 1 tablet by mouth daily.  Yes Historical Provider, MD  potassium chloride SA (K-DUR,KLOR-CON) 20 MEQ tablet Take 20 mEq by mouth daily. 08/14/13  Yes Gaye Pollack, MD  saccharomyces boulardii (FLORASTOR) 250 MG capsule Take 250 mg by mouth daily.   Yes Historical Provider, MD  warfarin (COUMADIN) 2.5 MG tablet Take 2.5-3.75 mg by mouth daily at 6 PM. 2.5 mg every day except takes 5mg  on Tuesdays, Thursdays, and Saturdays 12/09/13  Yes Arnoldo Lenis, MD  albuterol (VENTOLIN HFA) 108 (90 BASE) MCG/ACT inhaler Inhale 2 puffs into the lungs every 4 (four) hours as needed for wheezing or shortness of breath.     Historical Provider, MD  nitroGLYCERIN (NITROSTAT) 0.4 MG SL tablet Place 1 tablet (0.4 mg total) under the tongue every 5 (five) minutes as needed for chest pain. 09/05/13   Arnoldo Lenis, MD   BP 157/102  Pulse 66  Temp(Src) 97.9 F (36.6 C) (Oral)  Resp 22  Ht 5\' 2"  (1.575 m)  Wt 177 lb (80.287 kg)  BMI 32.37 kg/m2  SpO2 100%  Vital signs normal except hypertension  Physical Exam  Nursing note and vitals reviewed. Constitutional: She is oriented to person, place, and time. She appears well-developed and well-nourished.  Non-toxic appearance. She does not appear ill. No distress.  HENT:  Head: Normocephalic and atraumatic.  Right Ear: External  ear normal.  Left Ear: External ear normal.  Nose: Nose normal. No mucosal edema or rhinorrhea.  Mouth/Throat: Oropharynx is clear and moist and mucous membranes are normal. No dental abscesses or uvula swelling.  Eyes: Conjunctivae and EOM are normal. Pupils are equal, round, and reactive to light.  Neck: Normal range of motion and full passive range of motion without pain. Neck supple.  Cardiovascular: Normal rate, regular rhythm and normal heart sounds.  Exam reveals no gallop and no friction rub.   No murmur heard. Pulmonary/Chest: Effort normal and breath sounds normal. No respiratory distress. She has no wheezes. She has no rhonchi. She has no rales. She exhibits no tenderness and no crepitus.  Abdominal: Soft. Normal appearance and bowel sounds are normal. She exhibits no distension. There is no tenderness. There is no rebound and no guarding.  Musculoskeletal: Normal range of motion. She exhibits no edema and no tenderness.  Moves all extremities well.   Neurological: She is alert and oriented to person, place, and time. She has normal strength. No cranial nerve deficit.  Patient has equal grips, she has no facial asymmetry, she has no pronator drift, finger-to-nose is intact bilaterally, she has no motor weakness in her lower extremities, she has intact heel-to-shin bilaterally  Skin: Skin is warm, dry and intact. No rash noted. No erythema. No pallor.  Psychiatric: She has a normal mood and affect. Her speech is normal and behavior is normal. Her mood appears not anxious.    ED Course  Procedures (including critical care time)  Medications - No data to display  Patient ambulated to the bathroom with nursing staff. Although the family stated she had to have assistance to ambulate, the nursing note states patient ambulated twice with a steady gait. Review of her prior studies shows she had her last echocardiogram in October 2014 and had a Doppler ultrasound of her carotid arteries at  the same time. Her echocardiogram was normal, her Doppler ultrasound of her carotids shows up to 39% internal carotid artery stenosis bilaterally. Patient is therapeutic with her Coumadin. I found no neurological deficit on my neurological exam. At this point I explained  to the family I am not sure, she may have had a TIA however her symptoms have now resolved. She would not meet criteria for hospitalization and can be followed up as an outpatient by a neurologist. Patient will be referred to Dr. Merlene Laughter. Patient has an appointment in 3 days to have her INR checked again. Her family is concerned that her INR will drop even lower.  Doppler US of Carotids Summary: Findings suggest 1-39% internal carotid artery stenosis bilaterally. There is evidence of elevated peak systolic internal carotid artery velocitites bilaterally, however there is no evidence of significant plaque morphology to support 40-59% stenosis. Prepared and Electronically Authenticated by  Annamarie Major 2014-10-10T19:54:17.410   Labs Review Results for orders placed during the hospital encounter of 07/07/14  ETHANOL      Result Value Ref Range   Alcohol, Ethyl (B) <11  0 - 11 mg/dL  PROTIME-INR      Result Value Ref Range   Prothrombin Time 28.3 (*) 11.6 - 15.2 seconds   INR 2.65 (*) 0.00 - 1.49  APTT      Result Value Ref Range   aPTT 63 (*) 24 - 37 seconds  CBC      Result Value Ref Range   WBC 10.7 (*) 4.0 - 10.5 K/uL   RBC 4.08  3.87 - 5.11 MIL/uL   Hemoglobin 11.8 (*) 12.0 - 15.0 g/dL   HCT 36.0  36.0 - 46.0 %   MCV 88.2  78.0 - 100.0 fL   MCH 28.9  26.0 - 34.0 pg   MCHC 32.8  30.0 - 36.0 g/dL   RDW 14.0  11.5 - 15.5 %   Platelets 375  150 - 400 K/uL  DIFFERENTIAL      Result Value Ref Range   Neutrophils Relative % 76  43 - 77 %   Neutro Abs 8.2 (*) 1.7 - 7.7 K/uL   Lymphocytes Relative 14  12 - 46 %   Lymphs Abs 1.5  0.7 - 4.0 K/uL   Monocytes Relative 7  3 - 12 %   Monocytes Absolute 0.8  0.1 -  1.0 K/uL   Eosinophils Relative 2  0 - 5 %   Eosinophils Absolute 0.2  0.0 - 0.7 K/uL   Basophils Relative 1  0 - 1 %   Basophils Absolute 0.1  0.0 - 0.1 K/uL  COMPREHENSIVE METABOLIC PANEL      Result Value Ref Range   Sodium 139  137 - 147 mEq/L   Potassium 3.5 (*) 3.7 - 5.3 mEq/L   Chloride 98  96 - 112 mEq/L   CO2 25  19 - 32 mEq/L   Glucose, Bld 113 (*) 70 - 99 mg/dL   BUN 13  6 - 23 mg/dL   Creatinine, Ser 0.84  0.50 - 1.10 mg/dL   Calcium 9.6  8.4 - 10.5 mg/dL   Total Protein 8.3  6.0 - 8.3 g/dL   Albumin 3.3 (*) 3.5 - 5.2 g/dL   AST 24  0 - 37 U/L   ALT 34  0 - 35 U/L   Alkaline Phosphatase 97  39 - 117 U/L   Total Bilirubin 0.2 (*) 0.3 - 1.2 mg/dL   GFR calc non Af Amer 69 (*) >90 mL/min   GFR calc Af Amer 80 (*) >90 mL/min   Anion gap 16 (*) 5 - 15  URINE RAPID DRUG SCREEN (HOSP PERFORMED)      Result Value Ref Range   Opiates NONE  DETECTED  NONE DETECTED   Cocaine NONE DETECTED  NONE DETECTED   Benzodiazepines NONE DETECTED  NONE DETECTED   Amphetamines NONE DETECTED  NONE DETECTED   Tetrahydrocannabinol NONE DETECTED  NONE DETECTED   Barbiturates NONE DETECTED  NONE DETECTED  URINALYSIS, ROUTINE W REFLEX MICROSCOPIC      Result Value Ref Range   Color, Urine STRAW (*) YELLOW   APPearance CLEAR  CLEAR   Specific Gravity, Urine <1.005 (*) 1.005 - 1.030   pH 7.0  5.0 - 8.0   Glucose, UA NEGATIVE  NEGATIVE mg/dL   Hgb urine dipstick NEGATIVE  NEGATIVE   Bilirubin Urine NEGATIVE  NEGATIVE   Ketones, ur NEGATIVE  NEGATIVE mg/dL   Protein, ur NEGATIVE  NEGATIVE mg/dL   Urobilinogen, UA 0.2  0.0 - 1.0 mg/dL   Nitrite NEGATIVE  NEGATIVE   Leukocytes, UA NEGATIVE  NEGATIVE  TROPONIN I      Result Value Ref Range   Troponin I <0.30  <0.30 ng/mL  CBG MONITORING, ED      Result Value Ref Range   Glucose-Capillary 125 (*) 70 - 99 mg/dL   Laboratory interpretation all normal except mild hypokalemia, mild anemia, leukocytosis, therapeutic INR    Imaging Review Ct  Head Wo Contrast  07/07/2014   CLINICAL DATA:  Initial encounter for confusion and right lower extremity weakness which began today. Personal history of anterior right frontal lobe infarct  EXAM: CT HEAD WITHOUT CONTRAST  TECHNIQUE: Contiguous axial images were obtained from the base of the skull through the vertex without intravenous contrast.  COMPARISON:  MRI brain 01/25/2012 and CT head 01/25/2012.  FINDINGS: A remote anterior right frontal lobe infarct demonstrates interval volume loss. There is ex vacuo dilation of the adjacent lateral ventricle. Mild generalized atrophy is similar to the prior exam. No acute cortical infarct, hemorrhage, or mass lesion is present. The ventricles are proportionate to the degree of atrophy. No significant extra-axial fluid collection is present.  The paranasal sinuses and mastoid air cells are clear. The osseous skull is intact.  IMPRESSION: 1. Remote anterior right frontal lobe infarct. 2. Mild atrophy and white matter disease is otherwise within normal limits for age. 3. No acute intracranial abnormality.   Electronically Signed   By: Lawrence Santiago M.D.   On: 07/07/2014 20:20   Mr Brain Wo Contrast  07/07/2014   CLINICAL DATA:  Altered mental status. History breast cancer. Right-sided weakness.  EXAM: MRI HEAD WITHOUT CONTRAST  TECHNIQUE: Multiplanar, multiecho pulse sequences of the brain and surrounding structures were obtained without intravenous contrast.  COMPARISON:  CT head 07/07/2014 explain  FINDINGS: Negative for acute infarct  Chronic right frontal lobe infarct. Chronic microvascular ischemic change in the white matter. Chronic infarct left lateral cerebellum. Mild chronic microvascular ischemic change in the pons and left thalamus.  Negative for intracranial hemorrhage. No mass or edema. No shift of the midline structures  Paranasal sinuses are clear.  IMPRESSION: Negative for acute infarct.  Chronic ischemic changes as above.   Electronically Signed   By:  Franchot Gallo M.D.   On: 07/07/2014 20:49     EKG Interpretation   Date/Time:  Monday July 07 2014 19:15:44 EDT Ventricular Rate:  63 PR Interval:  207 QRS Duration: 91 QT Interval:  451 QTC Calculation: 462 R Axis:   66 Text Interpretation:  Sinus rhythm Low voltage, precordial leads No  significant change since last tracing 12 May 2014 Confirmed by [facility]   MD-I, Kaiah Hosea ([id number]) on  07/07/2014 8:19:48 PM      MDM   Final diagnoses:  Weakness  Warfarin-induced coagulopathy    Plan discharge  Rolland Porter, MD, Alanson Aly, MD 07/07/14 2153

## 2014-07-07 NOTE — ED Notes (Signed)
Discharge instructions given, pt demonstrated teach back and verbal understanding. No concerns voiced.  

## 2014-07-07 NOTE — ED Notes (Signed)
Surgical site to patient lower mid back reddened with 2 small round skin nodules approximately 2-48mm in diameter at base of well healed incision. Informed Dr. Tomi Bamberger that patients daughter states this is not the normal color of this site. Pt states it does not hurt, and nodule is normal appearance of wound.

## 2014-07-07 NOTE — ED Notes (Signed)
Pt has ambulated to bathroom and back x2 with steady gate. Tolerated well.

## 2014-07-07 NOTE — Discharge Instructions (Signed)
She has had a echocardiogram that was normal less than a year ago and carotid blood flow studies done at the same time. She is therapeutic on her Coumadin. Her symptoms appear to have resolved. At this point she would not meet criteria for admission to the hospital and she is on maximum medication regimin to prevent stroke. . You can have her followed up by her primary care doctor and you can have her rechecked by a neurologist. Dr. Merlene Laughter is the neurologist and [place]. He can call his office for an appointment.

## 2014-07-07 NOTE — ED Notes (Signed)
Onset app 30 min pta, weakness rt leg, On arrival to treatment room , weakness of both legs.  Sl headache.  No N/V

## 2014-07-14 ENCOUNTER — Ambulatory Visit (INDEPENDENT_AMBULATORY_CARE_PROVIDER_SITE_OTHER): Payer: Medicare Other | Admitting: *Deleted

## 2014-07-14 DIAGNOSIS — Z7901 Long term (current) use of anticoagulants: Secondary | ICD-10-CM

## 2014-07-14 DIAGNOSIS — I481 Persistent atrial fibrillation: Secondary | ICD-10-CM

## 2014-07-14 DIAGNOSIS — I4819 Other persistent atrial fibrillation: Secondary | ICD-10-CM

## 2014-07-14 DIAGNOSIS — I639 Cerebral infarction, unspecified: Secondary | ICD-10-CM

## 2014-07-14 DIAGNOSIS — Z5181 Encounter for therapeutic drug level monitoring: Secondary | ICD-10-CM

## 2014-07-14 LAB — POCT INR: INR: 3.1

## 2014-07-21 ENCOUNTER — Telehealth: Payer: Self-pay | Admitting: *Deleted

## 2014-07-21 NOTE — Telephone Encounter (Signed)
Dr Hal Neer started pt on Keflex this morning for redness, poss. Cellulitis at surgical site on back.  Informed pt keflex does not usually interfer with coumadin.  She will continue current dose of coumadin and keflex and come for INR appt on 10/21 as scheduled.  She verbalized understanding.

## 2014-07-21 NOTE — Telephone Encounter (Signed)
Pt was put on  CEPHALEXIN this morning

## 2014-07-23 ENCOUNTER — Ambulatory Visit (INDEPENDENT_AMBULATORY_CARE_PROVIDER_SITE_OTHER): Payer: Medicare Other | Admitting: *Deleted

## 2014-07-23 DIAGNOSIS — Z7901 Long term (current) use of anticoagulants: Secondary | ICD-10-CM

## 2014-07-23 DIAGNOSIS — I4891 Unspecified atrial fibrillation: Secondary | ICD-10-CM

## 2014-07-23 DIAGNOSIS — Z5181 Encounter for therapeutic drug level monitoring: Secondary | ICD-10-CM

## 2014-07-23 DIAGNOSIS — I639 Cerebral infarction, unspecified: Secondary | ICD-10-CM

## 2014-07-23 LAB — POCT INR: INR: 3.3

## 2014-07-30 ENCOUNTER — Ambulatory Visit (INDEPENDENT_AMBULATORY_CARE_PROVIDER_SITE_OTHER): Payer: Medicare Other | Admitting: *Deleted

## 2014-07-30 DIAGNOSIS — Z7901 Long term (current) use of anticoagulants: Secondary | ICD-10-CM

## 2014-07-30 DIAGNOSIS — I639 Cerebral infarction, unspecified: Secondary | ICD-10-CM

## 2014-07-30 DIAGNOSIS — I4891 Unspecified atrial fibrillation: Secondary | ICD-10-CM

## 2014-07-30 DIAGNOSIS — Z5181 Encounter for therapeutic drug level monitoring: Secondary | ICD-10-CM

## 2014-07-30 LAB — POCT INR: INR: 3.3

## 2014-08-11 ENCOUNTER — Ambulatory Visit (INDEPENDENT_AMBULATORY_CARE_PROVIDER_SITE_OTHER): Payer: Medicare Other | Admitting: *Deleted

## 2014-08-11 DIAGNOSIS — I639 Cerebral infarction, unspecified: Secondary | ICD-10-CM

## 2014-08-11 DIAGNOSIS — I4891 Unspecified atrial fibrillation: Secondary | ICD-10-CM

## 2014-08-11 DIAGNOSIS — Z5181 Encounter for therapeutic drug level monitoring: Secondary | ICD-10-CM

## 2014-08-11 LAB — POCT INR: INR: 4.9

## 2014-08-18 ENCOUNTER — Ambulatory Visit (INDEPENDENT_AMBULATORY_CARE_PROVIDER_SITE_OTHER): Payer: Medicare Other | Admitting: *Deleted

## 2014-08-18 ENCOUNTER — Telehealth: Payer: Self-pay | Admitting: *Deleted

## 2014-08-18 DIAGNOSIS — I639 Cerebral infarction, unspecified: Secondary | ICD-10-CM

## 2014-08-18 DIAGNOSIS — I4891 Unspecified atrial fibrillation: Secondary | ICD-10-CM

## 2014-08-18 DIAGNOSIS — Z7901 Long term (current) use of anticoagulants: Secondary | ICD-10-CM

## 2014-08-18 DIAGNOSIS — Z5181 Encounter for therapeutic drug level monitoring: Secondary | ICD-10-CM

## 2014-08-18 LAB — POCT INR: INR: 3.1

## 2014-08-18 NOTE — Telephone Encounter (Signed)
Pt will not be able to do dental work on Tuesday, so she will not need wed appt, but needs to make a regular follow up/tmj. Not on 11/23 or 12/2

## 2014-08-18 NOTE — Patient Instructions (Signed)
Pt cancelled dental procedure due to continued back pain.  It has been rescheduled for December.  She will continue current coumadin dose and and INR appt changed to 09/01/14.  Pt and husband verbalized understanding.

## 2014-09-01 ENCOUNTER — Ambulatory Visit (INDEPENDENT_AMBULATORY_CARE_PROVIDER_SITE_OTHER): Payer: Medicare Other | Admitting: *Deleted

## 2014-09-01 DIAGNOSIS — I4891 Unspecified atrial fibrillation: Secondary | ICD-10-CM

## 2014-09-01 DIAGNOSIS — Z5181 Encounter for therapeutic drug level monitoring: Secondary | ICD-10-CM

## 2014-09-01 DIAGNOSIS — I639 Cerebral infarction, unspecified: Secondary | ICD-10-CM

## 2014-09-01 DIAGNOSIS — Z7901 Long term (current) use of anticoagulants: Secondary | ICD-10-CM

## 2014-09-01 LAB — POCT INR: INR: 3.8

## 2014-09-11 ENCOUNTER — Encounter (HOSPITAL_COMMUNITY): Payer: Self-pay | Admitting: Cardiovascular Disease

## 2014-09-15 ENCOUNTER — Ambulatory Visit (INDEPENDENT_AMBULATORY_CARE_PROVIDER_SITE_OTHER): Payer: Medicare Other | Admitting: *Deleted

## 2014-09-15 DIAGNOSIS — Z5181 Encounter for therapeutic drug level monitoring: Secondary | ICD-10-CM

## 2014-09-15 DIAGNOSIS — I639 Cerebral infarction, unspecified: Secondary | ICD-10-CM

## 2014-09-15 DIAGNOSIS — I4891 Unspecified atrial fibrillation: Secondary | ICD-10-CM

## 2014-09-15 DIAGNOSIS — Z7901 Long term (current) use of anticoagulants: Secondary | ICD-10-CM

## 2014-09-15 LAB — POCT INR: INR: 3.7

## 2014-09-18 ENCOUNTER — Other Ambulatory Visit: Payer: Self-pay | Admitting: Surgery

## 2014-09-22 ENCOUNTER — Ambulatory Visit (INDEPENDENT_AMBULATORY_CARE_PROVIDER_SITE_OTHER): Payer: Medicare Other | Admitting: *Deleted

## 2014-09-22 DIAGNOSIS — Z5181 Encounter for therapeutic drug level monitoring: Secondary | ICD-10-CM

## 2014-09-22 DIAGNOSIS — I639 Cerebral infarction, unspecified: Secondary | ICD-10-CM

## 2014-09-22 DIAGNOSIS — I4891 Unspecified atrial fibrillation: Secondary | ICD-10-CM

## 2014-09-22 DIAGNOSIS — Z7901 Long term (current) use of anticoagulants: Secondary | ICD-10-CM

## 2014-09-22 LAB — POCT INR: INR: 2.4

## 2014-10-01 ENCOUNTER — Ambulatory Visit (INDEPENDENT_AMBULATORY_CARE_PROVIDER_SITE_OTHER): Payer: Medicare Other | Admitting: *Deleted

## 2014-10-01 DIAGNOSIS — I4891 Unspecified atrial fibrillation: Secondary | ICD-10-CM

## 2014-10-01 DIAGNOSIS — Z5181 Encounter for therapeutic drug level monitoring: Secondary | ICD-10-CM

## 2014-10-01 DIAGNOSIS — I639 Cerebral infarction, unspecified: Secondary | ICD-10-CM

## 2014-10-01 DIAGNOSIS — Z7901 Long term (current) use of anticoagulants: Secondary | ICD-10-CM

## 2014-10-01 LAB — POCT INR: INR: 2.2

## 2014-10-08 ENCOUNTER — Emergency Department (HOSPITAL_COMMUNITY)
Admission: EM | Admit: 2014-10-08 | Discharge: 2014-10-08 | Disposition: A | Discharge status: Home / Self Care | Payer: Medicare Other | Attending: Emergency Medicine | Admitting: Emergency Medicine

## 2014-10-08 ENCOUNTER — Encounter (HOSPITAL_COMMUNITY): Payer: Self-pay | Admitting: *Deleted

## 2014-10-08 ENCOUNTER — Emergency Department (HOSPITAL_COMMUNITY): Payer: Medicare Other

## 2014-10-08 DIAGNOSIS — N182 Chronic kidney disease, stage 2 (mild): Secondary | ICD-10-CM | POA: Insufficient documentation

## 2014-10-08 DIAGNOSIS — Y92838 Other recreation area as the place of occurrence of the external cause: Secondary | ICD-10-CM | POA: Insufficient documentation

## 2014-10-08 DIAGNOSIS — E785 Hyperlipidemia, unspecified: Secondary | ICD-10-CM | POA: Diagnosis not present

## 2014-10-08 DIAGNOSIS — Y9389 Activity, other specified: Secondary | ICD-10-CM | POA: Diagnosis not present

## 2014-10-08 DIAGNOSIS — Z9889 Other specified postprocedural states: Secondary | ICD-10-CM | POA: Diagnosis not present

## 2014-10-08 DIAGNOSIS — Z7951 Long term (current) use of inhaled steroids: Secondary | ICD-10-CM | POA: Insufficient documentation

## 2014-10-08 DIAGNOSIS — Z7901 Long term (current) use of anticoagulants: Secondary | ICD-10-CM | POA: Diagnosis not present

## 2014-10-08 DIAGNOSIS — I4891 Unspecified atrial fibrillation: Secondary | ICD-10-CM | POA: Diagnosis not present

## 2014-10-08 DIAGNOSIS — F329 Major depressive disorder, single episode, unspecified: Secondary | ICD-10-CM | POA: Diagnosis not present

## 2014-10-08 DIAGNOSIS — D649 Anemia, unspecified: Secondary | ICD-10-CM | POA: Diagnosis not present

## 2014-10-08 DIAGNOSIS — W01198A Fall on same level from slipping, tripping and stumbling with subsequent striking against other object, initial encounter: Secondary | ICD-10-CM | POA: Insufficient documentation

## 2014-10-08 DIAGNOSIS — I129 Hypertensive chronic kidney disease with stage 1 through stage 4 chronic kidney disease, or unspecified chronic kidney disease: Secondary | ICD-10-CM | POA: Diagnosis not present

## 2014-10-08 DIAGNOSIS — S0990XA Unspecified injury of head, initial encounter: Secondary | ICD-10-CM | POA: Insufficient documentation

## 2014-10-08 DIAGNOSIS — Y998 Other external cause status: Secondary | ICD-10-CM | POA: Insufficient documentation

## 2014-10-08 DIAGNOSIS — Z853 Personal history of malignant neoplasm of breast: Secondary | ICD-10-CM | POA: Diagnosis not present

## 2014-10-08 DIAGNOSIS — E871 Hypo-osmolality and hyponatremia: Secondary | ICD-10-CM | POA: Insufficient documentation

## 2014-10-08 DIAGNOSIS — Z79899 Other long term (current) drug therapy: Secondary | ICD-10-CM | POA: Diagnosis not present

## 2014-10-08 DIAGNOSIS — T1490XA Injury, unspecified, initial encounter: Secondary | ICD-10-CM

## 2014-10-08 DIAGNOSIS — Z8719 Personal history of other diseases of the digestive system: Secondary | ICD-10-CM | POA: Diagnosis not present

## 2014-10-08 LAB — PROTIME-INR
INR: 2.16 — ABNORMAL HIGH (ref 0.00–1.49)
PROTHROMBIN TIME: 24.3 s — AB (ref 11.6–15.2)

## 2014-10-08 NOTE — Discharge Instructions (Signed)
Blunt Trauma °You have been evaluated for injuries. You have been examined and your caregiver has not found injuries serious enough to require hospitalization. °It is common to have multiple bruises and sore muscles following an accident. These tend to feel worse for the first 24 hours. You will feel more stiffness and soreness over the next several hours and worse when you wake up the first morning after your accident. After this point, you should begin to improve with each passing day. The amount of improvement depends on the amount of damage done in the accident. °Following your accident, if some part of your body does not work as it should, or if the pain in any area continues to increase, you should return to the Emergency Department for re-evaluation.  °HOME CARE INSTRUCTIONS  °Routine care for sore areas should include: °· Ice to sore areas every 2 hours for 20 minutes while awake for the next 2 days. °· Drink extra fluids (not alcohol). °· Take a hot or warm shower or bath once or twice a day to increase blood flow to sore muscles. This will help you "limber up". °· Activity as tolerated. Lifting may aggravate neck or back pain. °· Only take over-the-counter or prescription medicines for pain, discomfort, or fever as directed by your caregiver. Do not use aspirin. This may increase bruising or increase bleeding if there are small areas where this is happening. °SEEK IMMEDIATE MEDICAL CARE IF: °· Numbness, tingling, weakness, or problem with the use of your arms or legs. °· A severe headache is not relieved with medications. °· There is a change in bowel or bladder control. °· Increasing pain in any areas of the body. °· Short of breath or dizzy. °· Nauseated, vomiting, or sweating. °· Increasing belly (abdominal) discomfort. °· Blood in urine, stool, or vomiting blood. °· Pain in either shoulder in an area where a shoulder strap would be. °· Feelings of lightheadedness or if you have a fainting  episode. °Sometimes it is not possible to identify all injuries immediately after the trauma. It is important that you continue to monitor your condition after the emergency department visit. If you feel you are not improving, or improving more slowly than should be expected, call your physician. If you feel your symptoms (problems) are worsening, return to the Emergency Department immediately. °Document Released: 06/15/2001 Document Revised: 12/12/2011 Document Reviewed: 05/07/2008 °[employer]® Patient Information ©2015 [employer]. This information is not intended to replace advice given to you by your health care provider. Make sure you discuss any questions you have with your health care provider. ° °

## 2014-10-08 NOTE — ED Notes (Addendum)
Pt was on a large exercise ball and rolled back to far and hit her head on an exercise machine. Pt states she is on coumadin. Pt states she did not lose consciousness. Pt states headache.

## 2014-10-08 NOTE — ED Provider Notes (Signed)
CSN: [id number]     Arrival date & time 10/08/14  1231 History  This chart was scribed for Dorie Rank, MD by Stephania Fragmin, ED Scribe. This patient was seen in room APA03/APA03 and the patient's care was started at 2:25 PM.    Chief Complaint  Patient presents with  . Head Injury   The history is provided by the patient, the spouse and medical records. No language interpreter was used.     HPI Comments: [name] is a 69 y.o. female with a history of CVA and 2 bovine mitral valve replacements, who presents to the Emergency Department for a head injury that occurred when patient was at the gym, rolled backwards too far on an exercise ball, and hit her head on a piece of gym equipment. She denies LOC. Patient states she is currently in pain. Per husband, patient is also on Coumadin, with her last INR level check taken about 1 week ago. Patient's INR level (2.2, per medical records) was a little lower than the 2.5-3.5 range her provider would like, so her dosage of Coumadin was adjusted. She is scheduled for a f/u visit in 1 week. Patient denies any other pains.  Past Medical History  Diagnosis Date  . Rheumatic heart disease     a. 07/2013 Echo: Ef 55-60%, Mod AS/AI, Mod-Sev MS, sev dil LA, PASP 58;  b. 07/2013 TEE: EF 35-40%, mild-mod AS/AI, mod-sev MS, LA smoke;  c. 07/2013 Cath: elev R heart pressures, nl cors.  . Gastroesophageal reflux disease   . Hyperlipidemia   . Cardioembolic stroke 03/6598    Right frontal in 01/2012; normal carotid ultrasound; possible LAA thrombus by TEE; virtual complete neurologic recovery  . Fasting hyperglycemia     120 fasting  . Breast carcinoma 1995    1995  . Depression   . Diverticulosis of colon (without mention of hemorrhage) 2012    Dr. Laural Golden  . Gastroparesis   . Hemorrhoids   . Anemia   . Shortness of breath   . Hypertension     pt denies 05/30/13     Dr Kathe Mariner  . Atrial fibrillation   . Hyponatremia   . Chronic kidney disease,  stage 2, mildly decreased GFR     GFR of approximately 60   Past Surgical History  Procedure Laterality Date  . Mitral valve surgery  [facility], closed mitral valvulotomy by finger fracture  . Cardiac catheterization    . Dilation and curettage of uterus    . Breast lumpectomy Right 1995  . Tubal ligation  1973  . Tee without cardioversion  01/24/2012    Procedure: TRANSESOPHAGEAL ECHOCARDIOGRAM (TEE);  Surgeon: Lelon Perla, MD;  Location: [facility] ENDOSCOPY;  Service: Cardiovascular;  Laterality: N/A;  . Colonoscopy  2012    Negative screening procedure  . Esophageal manometry N/A 06/17/2013    Procedure: ESOPHAGEAL MANOMETRY (EM);  Surgeon: Sable Feil, MD;  Location: [facility] ENDOSCOPY;  Service: Endoscopy;  Laterality: N/A;  . Tee without cardioversion N/A 07/11/2013    Procedure: TRANSESOPHAGEAL ECHOCARDIOGRAM (TEE);  Surgeon: Thayer Headings, MD;  Location: [place];  Service: Cardiovascular;  Laterality: N/A;  . Aortic valve replacement N/A 07/18/2013    Procedure: AORTIC VALVE REPLACEMENT (AVR);  Surgeon: Gaye Pollack, MD;  Location: [place];  Service: Open Heart Surgery;  Laterality: N/A;  . Intraoperative transesophageal echocardiogram N/A 07/18/2013    Procedure: INTRAOPERATIVE TRANSESOPHAGEAL ECHOCARDIOGRAM;  Surgeon: Gaye Pollack, MD;  Location: [facility] OR;  Service: Open Heart Surgery;  Laterality: N/A;  . Mitral valve replacement N/A 07/18/2013    Procedure: MITRAL VALVE (MV) REPLACEMENT;  Surgeon: Gaye Pollack, MD;  Location: [place] OR;  Service: Open Heart Surgery;  Laterality: N/A;  . Tee without cardioversion N/A 07/26/2013    Procedure: TRANSESOPHAGEAL ECHOCARDIOGRAM (TEE);  Surgeon: Fay Records, MD;  Location: [place];  Service: Cardiovascular;  Laterality: N/A;  . Cardioversion N/A 07/26/2013    Procedure: CARDIOVERSION;  Surgeon: Fay Records, MD;  Location: [facility] ENDOSCOPY;  Service: Cardiovascular;  Laterality: N/A;  . Lumbar laminectomy/decompression  microdiscectomy Right 05/13/2014    Procedure: LUMBAR LAMINECTOMY/DECOMPRESSION MICRODISCECTOMY 1 LEVEL  lumbar four/five;  Surgeon: Faythe Ghee, MD;  Location: [facility] NEURO ORS;  Service: Neurosurgery;  Laterality: Right;  . Back surgery    . Left and right heart catheterization with coronary angiogram N/A 07/10/2013    Procedure: LEFT AND RIGHT HEART CATHETERIZATION WITH CORONARY ANGIOGRAM;  Surgeon: Burnell Blanks, MD;  Location: [facility] CATH LAB;  Service: Cardiovascular;  Laterality: N/A;   Family History  Problem Relation Age of Onset  . Heart disease Brother 30    MI  . Rheum arthritis Maternal Grandmother   . Asthma Maternal Grandfather   . Hypothyroidism Mother   . Diabetes Sister   . Cirrhosis Father   . Cervical cancer Sister     malignant neoplasm of cervix uteri   History  Substance Use Topics  . Smoking status: Never Smoker   . Smokeless tobacco: Never Used  . Alcohol Use: No   OB History    No data available     Review of Systems  Musculoskeletal: Negative for myalgias.  Neurological: Positive for headaches. Negative for syncope.  All other systems reviewed and are negative.     Allergies  Amiodarone  Home Medications   Prior to Admission medications   Medication Sig Start Date End Date Taking? Authorizing Provider  albuterol (VENTOLIN HFA) 108 (90 BASE) MCG/ACT inhaler Inhale 2 puffs into the lungs every 4 (four) hours as needed for wheezing or shortness of breath.     Historical Provider, MD  atorvastatin (LIPITOR) 20 MG tablet Take 20 mg by mouth every evening.     Historical Provider, MD  beclomethasone (QVAR) 80 MCG/ACT inhaler Inhale 2 puffs into the lungs 2 (two) times daily. Pt needs appt for more refills 05/06/14   Kathee Delton, MD  buPROPion (WELLBUTRIN XL) 150 MG 24 hr tablet Take 150 mg by mouth daily.      Historical Provider, MD  diltiazem (CARDIZEM CD) 180 MG 24 hr capsule Take 1 capsule (180 mg total) by mouth daily. 08/03/13   Arlyn Leak  Collins, PA-C  fluticasone (FLONASE) 50 MCG/ACT nasal spray Place 1 spray into both nostrils daily as needed.  09/05/13   Arnoldo Lenis, MD  furosemide (LASIX) 40 MG tablet Take 40 mg by mouth daily. *May take additional tablet at bedtime as needed for swelling 12/05/13   Satira Sark, MD  levothyroxine (SYNTHROID, LEVOTHROID) 112 MCG tablet Take 1 tablet by mouth daily. 10/17/13   Historical Provider, MD  metFORMIN (GLUCOPHAGE) 500 MG tablet Take 1 tablet (500 mg total) by mouth 2 (two) times daily with a meal. 08/03/13   Coolidge Breeze, PA-C  metoprolol tartrate (LOPRESSOR) 25 MG tablet Take 1 tablet (25 mg total) by mouth 2 (two) times daily. 12/05/13   Arnoldo Lenis, MD  Multiple Vitamin ([place])  TABS Take 1 tablet by mouth daily.    Historical Provider, MD  nitroGLYCERIN (NITROSTAT) 0.4 MG SL tablet Place 1 tablet (0.4 mg total) under the tongue every 5 (five) minutes as needed for chest pain. 09/05/13   Arnoldo Lenis, MD  potassium chloride SA (K-DUR,KLOR-CON) 20 MEQ tablet Take 20 mEq by mouth daily. 08/14/13   Gaye Pollack, MD  saccharomyces boulardii (FLORASTOR) 250 MG capsule Take 250 mg by mouth daily.    Historical Provider, MD  warfarin (COUMADIN) 2.5 MG tablet Take 2.5-3.75 mg by mouth daily at 6 PM. 2.5 mg every day except takes 5mg  on Tuesdays, Thursdays, and Saturdays 12/09/13   Arnoldo Lenis, MD   BP 167/69 mmHg  Pulse 65  Temp(Src) 98.2 F (36.8 C) (Oral)  Resp 16  Ht 5\' 2"  (1.575 m)  Wt 165 lb (74.844 kg)  BMI 30.17 kg/m2  SpO2 100% Physical Exam  Constitutional: She appears well-developed and well-nourished. No distress.  HENT:  Head: Normocephalic and atraumatic.  Right Ear: External ear normal.  Left Ear: External ear normal.  Eyes: Conjunctivae are normal. Right eye exhibits no discharge. Left eye exhibits no discharge. No scleral icterus.  Neck: Neck supple. No tracheal deviation present.  Cardiovascular: Normal rate and normal  heart sounds.  Exam reveals no gallop and no friction rub.   No murmur heard. Pulmonary/Chest: Effort normal and breath sounds normal. No stridor. No respiratory distress.  Abdominal: Soft. She exhibits no distension. There is no tenderness.  Musculoskeletal: She exhibits no edema.  Neurological: She is alert. Cranial nerve deficit: no gross deficits.  Skin: Skin is warm and dry. No rash noted.  Psychiatric: She has a normal mood and affect.  Nursing note and vitals reviewed.   ED Course  Procedures (including critical care time)  DIAGNOSTIC STUDIES: Oxygen Saturation is 100% on room air, normal by my interpretation.  .   Labs Review Labs Reviewed  PROTIME-INR - Abnormal; Notable for the following:    Prothrombin Time 24.3 (*)    INR 2.16 (*)    All other components within normal limits    Imaging Review Ct Head Wo Contrast  10/08/2014   CLINICAL DATA:  Trauma to back of head while exercising.  Pain.  EXAM: CT HEAD WITHOUT CONTRAST  TECHNIQUE: Contiguous axial images were obtained from the base of the skull through the vertex without intravenous contrast.  COMPARISON:  Brain MR 07/07/2014.  CT 07/07/2014.  FINDINGS: Sinuses/Soft tissues: Suspect soft tissue swelling about the posterior vertex. Hypoplastic frontal sinuses.  Intracranial: Remote right frontal lobe infarct. Mild low density in the periventricular white matter likely related to small vessel disease.  No mass lesion, hemorrhage, hydrocephalus, acute infarct, intra-axial, or extra-axial fluid collection.  IMPRESSION: 1.  No acute intracranial abnormality. 2. Small vessel ischemic change with a remote right frontal lobe small volume infarct.   Electronically Signed   By: Abigail Miyamoto M.D.   On: 10/08/2014 13:43     MDM   Final diagnoses:  Head injury, initial encounter   INR is not elevated.  No sign of serious injury on the head ct.  Stable for discharge.  I personally performed the services described in this  documentation, which was scribed in my presence.  The recorded information has been reviewed and is accurate.    Dorie Rank, MD 10/08/14 1537

## 2014-10-15 ENCOUNTER — Ambulatory Visit (INDEPENDENT_AMBULATORY_CARE_PROVIDER_SITE_OTHER): Payer: Medicare Other | Admitting: *Deleted

## 2014-10-15 DIAGNOSIS — I639 Cerebral infarction, unspecified: Secondary | ICD-10-CM

## 2014-10-15 DIAGNOSIS — Z7901 Long term (current) use of anticoagulants: Secondary | ICD-10-CM

## 2014-10-15 DIAGNOSIS — Z5181 Encounter for therapeutic drug level monitoring: Secondary | ICD-10-CM

## 2014-10-15 DIAGNOSIS — I4891 Unspecified atrial fibrillation: Secondary | ICD-10-CM

## 2014-10-15 LAB — POCT INR: INR: 2.8

## 2014-10-21 ENCOUNTER — Encounter: Payer: Self-pay | Admitting: Cardiology

## 2014-10-21 ENCOUNTER — Ambulatory Visit (INDEPENDENT_AMBULATORY_CARE_PROVIDER_SITE_OTHER): Payer: Medicare Other | Admitting: Cardiology

## 2014-10-21 VITALS — BP 120/72 | HR 74 | Ht 62.0 in | Wt 173.0 lb

## 2014-10-21 DIAGNOSIS — I4891 Unspecified atrial fibrillation: Secondary | ICD-10-CM

## 2014-10-21 DIAGNOSIS — I350 Nonrheumatic aortic (valve) stenosis: Secondary | ICD-10-CM

## 2014-10-21 DIAGNOSIS — I050 Rheumatic mitral stenosis: Secondary | ICD-10-CM

## 2014-10-21 NOTE — Patient Instructions (Signed)
Your physician wants you to follow-up in: 6 months with Dr.Branch You will receive a reminder letter in the mail two months in advance. If you don't receive a letter, please call our office to schedule the follow-up appointment.    STOP NTG      Thank you for choosing [facility] !

## 2014-10-21 NOTE — Progress Notes (Signed)
Clinical Summary Ms. Winstanley is a 69 y.o.female seen today for follow up of the following medical problems.   1. Valvular heart disease  - 07/18/13 patient underwent MVR with 25 mm Edwards Magna-ease pericardial valve and also AVR with 21 mm Edwards Magna-ease pericardial valve   - denies any DOE since last visit. On treadmill 69min daily. Can have some occas leg swelling at times. Weighs every other daily, typically 167s and stable   2. Afib  - hx of cardioembolic CVA, has been on longterm anticoag - denies any palpitations - compliant with coumadin, denies any issues with bleeding  - compliant w/ dilitiazem and metoprolol      Past Medical History  Diagnosis Date  . Rheumatic heart disease     a. 07/2013 Echo: Ef 55-60%, Mod AS/AI, Mod-Sev MS, sev dil LA, PASP 58;  b. 07/2013 TEE: EF 35-40%, mild-mod AS/AI, mod-sev MS, LA smoke;  c. 07/2013 Cath: elev R heart pressures, nl cors.  . Gastroesophageal reflux disease   . Hyperlipidemia   . Cardioembolic stroke 06/6282    Right frontal in 01/2012; normal carotid ultrasound; possible LAA thrombus by TEE; virtual complete neurologic recovery  . Fasting hyperglycemia     120 fasting  . Breast carcinoma 1995    1995  . Depression   . Diverticulosis of colon (without mention of hemorrhage) 2012    Dr. Laural Golden  . Gastroparesis   . Hemorrhoids   . Anemia   . Shortness of breath   . Hypertension     pt denies 05/30/13     Dr Kathe Mariner  . Atrial fibrillation   . Hyponatremia   . Chronic kidney disease, stage 2, mildly decreased GFR     GFR of approximately 60     Allergies  Allergen Reactions  . Amiodarone Rash     Current Outpatient Prescriptions  Medication Sig Dispense Refill  . albuterol (VENTOLIN HFA) 108 (90 BASE) MCG/ACT inhaler Inhale 2 puffs into the lungs every 4 (four) hours as needed for wheezing or shortness of breath.     Marland Kitchen atorvastatin (LIPITOR) 20 MG tablet Take 20 mg by mouth every evening.      Marland Kitchen buPROPion (WELLBUTRIN XL) 150 MG 24 hr tablet Take 150 mg by mouth daily.      Marland Kitchen diltiazem (CARDIZEM CD) 180 MG 24 hr capsule Take 1 capsule (180 mg total) by mouth daily. 30 capsule 1  . fluticasone (FLONASE) 50 MCG/ACT nasal spray Place 1 spray into both nostrils daily as needed.     . furosemide (LASIX) 40 MG tablet Take 40 mg by mouth daily. *May take additional tablet at bedtime as needed for swelling    . levothyroxine (SYNTHROID, LEVOTHROID) 112 MCG tablet Take 1 tablet by mouth daily.    . metFORMIN (GLUCOPHAGE) 500 MG tablet Take 1 tablet (500 mg total) by mouth 2 (two) times daily with a meal. 60 tablet 1  . metoprolol tartrate (LOPRESSOR) 25 MG tablet Take 1 tablet (25 mg total) by mouth 2 (two) times daily. 180 tablet 3  . Multiple Vitamin (MULITIVITAMIN WITH MINERALS) TABS Take 1 tablet by mouth daily.    . nitroGLYCERIN (NITROSTAT) 0.4 MG SL tablet Place 1 tablet (0.4 mg total) under the tongue every 5 (five) minutes as needed for chest pain. 90 tablet 3  . potassium chloride SA (K-DUR,KLOR-CON) 20 MEQ tablet Take 20 mEq by mouth daily.    Marland Kitchen saccharomyces boulardii (FLORASTOR) 250 MG capsule Take  250 mg by mouth daily.    Marland Kitchen warfarin (COUMADIN) 2.5 MG tablet Take 2.5-3.75 mg by mouth daily at 6 PM. 2.5 mg every day except takes 5mg  on Tuesdays, Thursdays, and Saturdays     No current facility-administered medications for this visit.     Past Surgical History  Procedure Laterality Date  . Mitral valve surgery  [facility], closed mitral valvulotomy by finger fracture  . Cardiac catheterization    . Dilation and curettage of uterus    . Breast lumpectomy Right 1995  . Tubal ligation  1973  . Tee without cardioversion  01/24/2012    Procedure: TRANSESOPHAGEAL ECHOCARDIOGRAM (TEE);  Surgeon: Lelon Perla, MD;  Location: [facility] ENDOSCOPY;  Service: Cardiovascular;  Laterality: N/A;  . Colonoscopy  2012    Negative screening procedure  . Esophageal manometry N/A 06/17/2013     Procedure: ESOPHAGEAL MANOMETRY (EM);  Surgeon: Sable Feil, MD;  Location: [facility] ENDOSCOPY;  Service: Endoscopy;  Laterality: N/A;  . Tee without cardioversion N/A 07/11/2013    Procedure: TRANSESOPHAGEAL ECHOCARDIOGRAM (TEE);  Surgeon: Thayer Headings, MD;  Location: [place];  Service: Cardiovascular;  Laterality: N/A;  . Aortic valve replacement N/A 07/18/2013    Procedure: AORTIC VALVE REPLACEMENT (AVR);  Surgeon: Gaye Pollack, MD;  Location: [place];  Service: Open Heart Surgery;  Laterality: N/A;  . Intraoperative transesophageal echocardiogram N/A 07/18/2013    Procedure: INTRAOPERATIVE TRANSESOPHAGEAL ECHOCARDIOGRAM;  Surgeon: Gaye Pollack, MD;  Location: [facility] OR;  Service: Open Heart Surgery;  Laterality: N/A;  . Mitral valve replacement N/A 07/18/2013    Procedure: MITRAL VALVE (MV) REPLACEMENT;  Surgeon: Gaye Pollack, MD;  Location: [place] OR;  Service: Open Heart Surgery;  Laterality: N/A;  . Tee without cardioversion N/A 07/26/2013    Procedure: TRANSESOPHAGEAL ECHOCARDIOGRAM (TEE);  Surgeon: Fay Records, MD;  Location: [place];  Service: Cardiovascular;  Laterality: N/A;  . Cardioversion N/A 07/26/2013    Procedure: CARDIOVERSION;  Surgeon: Fay Records, MD;  Location: [facility] ENDOSCOPY;  Service: Cardiovascular;  Laterality: N/A;  . Lumbar laminectomy/decompression microdiscectomy Right 05/13/2014    Procedure: LUMBAR LAMINECTOMY/DECOMPRESSION MICRODISCECTOMY 1 LEVEL  lumbar four/five;  Surgeon: Faythe Ghee, MD;  Location: [facility] NEURO ORS;  Service: Neurosurgery;  Laterality: Right;  . Back surgery    . Left and right heart catheterization with coronary angiogram N/A 07/10/2013    Procedure: LEFT AND RIGHT HEART CATHETERIZATION WITH CORONARY ANGIOGRAM;  Surgeon: Burnell Blanks, MD;  Location: [facility] CATH LAB;  Service: Cardiovascular;  Laterality: N/A;     Allergies  Allergen Reactions  . Amiodarone Rash      Family History  Problem Relation Age of Onset  .  Heart disease Brother 67    MI  . Rheum arthritis Maternal Grandmother   . Asthma Maternal Grandfather   . Hypothyroidism Mother   . Diabetes Sister   . Cirrhosis Father   . Cervical cancer Sister     malignant neoplasm of cervix uteri     Social History Ms. Finken reports that she has never smoked. She has never used smokeless tobacco. Ms. Cherry reports that she does not drink alcohol.   Review of Systems CONSTITUTIONAL: No weight loss, fever, chills, weakness or fatigue.  HEENT: Eyes: No visual loss, blurred vision, double vision or yellow sclerae.No hearing loss, sneezing, congestion, runny nose or sore throat.  SKIN: No rash or itching.  CARDIOVASCULAR:  RESPIRATORY: No shortness of breath, cough or sputum.  GASTROINTESTINAL: No anorexia, nausea, vomiting or diarrhea. No abdominal pain or blood.  GENITOURINARY: No burning on urination, no polyuria NEUROLOGICAL: No headache, dizziness, syncope, paralysis, ataxia, numbness or tingling in the extremities. No change in bowel or bladder control.  MUSCULOSKELETAL: No muscle, back pain, joint pain or stiffness.  LYMPHATICS: No enlarged nodes. No history of splenectomy.  PSYCHIATRIC: No history of depression or anxiety.  ENDOCRINOLOGIC: No reports of sweating, cold or heat intolerance. No polyuria or polydipsia.  Marland Kitchen   Physical Examination p 74 bp 120/72 Wt 173 lbs BMI 32 Gen: resting comfortably, no acute distress HEENT: no scleral icterus, pupils equal round and reactive, no palptable cervical adenopathy,  CV: RRR, no m/r/g, no JVD Resp: Clear to auscultation bilaterally GI: abdomen is soft, non-tender, non-distended, normal bowel sounds, no hepatosplenomegaly MSK: extremities are warm, trace bilateral edema.  Skin: warm, no rash Neuro:  no focal deficits Psych: appropriate affect   Diagnostic Studies 07/2013 TTE Study Conclusions  - Study data: Technically difficult study. - Left ventricle: The cavity size was  normal. Wall thickness was normal. Systolic function was normal. The estimated ejection fraction was in the range of 55% to 60%. Indeterminate diastolic function. There is evidence of very high left atrial pressures (E/e' 63) - Aortic valve: Trileaflet; mildly thickened leaflets. Accurate evaluation of aortic stenosis limited in the setting of severe mitral stenosis and decreased ventricular loadin. Morphologically there does not appear to be significant stenosis. By gradient (mean PG 21 mmHg) the stenosis is moderate, by AVA VTI (0.8) stenosis is severe. Moderate regurgitation, AR PHT is 439 cm/s. Valve area: 0.78cm^2(VTI). Valve area: 0.78cm^2 (Vmax). - Mitral valve: The findings are consistent with moderate to severe stenosis. (Mean PG 12 mmHg, MVA by PHT 1.3. Cannot use MVA by VTI due to moderate AI. Valve area by pressure half-time: 1.38cm^2. - Left atrium: The atrium was severely dilated. - Right ventricle: Systolic function was mildly reduced, RV TAPSE is 1.4, TV systolic annular tissue velocity 10 cm/s. - Pulmonary arteries: Systolic pressure was moderately increased. PA peak pressure: 16mm Hg (S) (PASP based on a fairly faint spectral Doppler waveform)   07/2013 TEE Study Conclusions  - Left ventricle: Systolic function was moderately reduced. The estimated ejection fraction was in the range of 35% to 40%. - Aortic valve: There was mild to moderate stenosis. Mild to moderate regurgitation. - Mitral valve: The findings are consistent with moderate to severe stenosis. Valve area by pressure half-time: 1.45cm^2. - Left atrium: There was spontaneous echo contrast ("smoke"). - Atrial septum: No defect or patent foramen ovale was identified. - Tricuspid valve: No evidence of vegetation. Transesophageal echocardiography. 2D and color Doppler. Height: Height: 157.5cm. Height: 62in. Weight: Weight: 75kg. Weight: 165lb.  Body mass index: BMI: 30.2kg/m^2. Body surface area:  BSA: 1.27m^2. Blood pressure: 143/56. Patient status: Inpatient. Location: Endoscopy.    Assessment and Plan  1. Valvular heart disease  - history of mitral and aortic stenosis, s/p tissue valve replacement of both valves  - doing well from a symptoms standpoint, last imaging 07/26/13 TEE describes normal functioning prosthetic valves  - continue current meds  2. Afib  - unclear how long she has had afib, was diagnosed postoperatively but she does have a history of severe mitral stenosis, left atrial enlargement, and prior cardioembolic stroke. I suspect likely was ongoing before her recent surgery.  - denies any significant symptoms - hx of prior cardioembolic CVA, continue anticoag indefintitely       Arnoldo Lenis, M.D.

## 2014-10-23 ENCOUNTER — Other Ambulatory Visit (HOSPITAL_COMMUNITY): Payer: Self-pay | Admitting: Internal Medicine

## 2014-10-23 DIAGNOSIS — Z1231 Encounter for screening mammogram for malignant neoplasm of breast: Secondary | ICD-10-CM

## 2014-10-25 ENCOUNTER — Other Ambulatory Visit: Payer: Self-pay | Admitting: Cardiology

## 2014-11-03 ENCOUNTER — Ambulatory Visit (INDEPENDENT_AMBULATORY_CARE_PROVIDER_SITE_OTHER): Payer: Medicare Other | Admitting: *Deleted

## 2014-11-03 DIAGNOSIS — Z7901 Long term (current) use of anticoagulants: Secondary | ICD-10-CM

## 2014-11-03 DIAGNOSIS — I4891 Unspecified atrial fibrillation: Secondary | ICD-10-CM

## 2014-11-03 DIAGNOSIS — I480 Paroxysmal atrial fibrillation: Secondary | ICD-10-CM

## 2014-11-03 DIAGNOSIS — I639 Cerebral infarction, unspecified: Secondary | ICD-10-CM

## 2014-11-03 DIAGNOSIS — Z5181 Encounter for therapeutic drug level monitoring: Secondary | ICD-10-CM

## 2014-11-03 LAB — POCT INR: INR: 3.1

## 2014-11-05 ENCOUNTER — Other Ambulatory Visit: Payer: Self-pay | Admitting: Cardiology

## 2014-11-24 ENCOUNTER — Ambulatory Visit (INDEPENDENT_AMBULATORY_CARE_PROVIDER_SITE_OTHER): Payer: Medicare Other | Admitting: *Deleted

## 2014-11-24 ENCOUNTER — Ambulatory Visit (HOSPITAL_COMMUNITY)
Admission: RE | Admit: 2014-11-24 | Discharge: 2014-11-24 | Disposition: A | Discharge status: Home / Self Care | Payer: Medicare Other | Source: Ambulatory Visit | Attending: Internal Medicine | Admitting: Internal Medicine

## 2014-11-24 DIAGNOSIS — Z7901 Long term (current) use of anticoagulants: Secondary | ICD-10-CM

## 2014-11-24 DIAGNOSIS — I480 Paroxysmal atrial fibrillation: Secondary | ICD-10-CM

## 2014-11-24 DIAGNOSIS — I639 Cerebral infarction, unspecified: Secondary | ICD-10-CM

## 2014-11-24 DIAGNOSIS — I4891 Unspecified atrial fibrillation: Secondary | ICD-10-CM

## 2014-11-24 DIAGNOSIS — Z1231 Encounter for screening mammogram for malignant neoplasm of breast: Secondary | ICD-10-CM | POA: Insufficient documentation

## 2014-11-24 DIAGNOSIS — Z5181 Encounter for therapeutic drug level monitoring: Secondary | ICD-10-CM

## 2014-11-24 LAB — POCT INR: INR: 2.2

## 2014-12-03 ENCOUNTER — Ambulatory Visit (INDEPENDENT_AMBULATORY_CARE_PROVIDER_SITE_OTHER): Payer: Medicare Other | Admitting: *Deleted

## 2014-12-03 DIAGNOSIS — Z7901 Long term (current) use of anticoagulants: Secondary | ICD-10-CM

## 2014-12-03 DIAGNOSIS — Z5181 Encounter for therapeutic drug level monitoring: Secondary | ICD-10-CM

## 2014-12-03 DIAGNOSIS — I480 Paroxysmal atrial fibrillation: Secondary | ICD-10-CM

## 2014-12-03 DIAGNOSIS — I4891 Unspecified atrial fibrillation: Secondary | ICD-10-CM

## 2014-12-03 DIAGNOSIS — I639 Cerebral infarction, unspecified: Secondary | ICD-10-CM

## 2014-12-03 LAB — POCT INR: INR: 2.5

## 2014-12-08 ENCOUNTER — Ambulatory Visit (INDEPENDENT_AMBULATORY_CARE_PROVIDER_SITE_OTHER): Payer: Medicare Other | Admitting: *Deleted

## 2014-12-08 DIAGNOSIS — Z5181 Encounter for therapeutic drug level monitoring: Secondary | ICD-10-CM

## 2014-12-08 DIAGNOSIS — Z7901 Long term (current) use of anticoagulants: Secondary | ICD-10-CM

## 2014-12-08 DIAGNOSIS — I480 Paroxysmal atrial fibrillation: Secondary | ICD-10-CM

## 2014-12-08 DIAGNOSIS — I639 Cerebral infarction, unspecified: Secondary | ICD-10-CM

## 2014-12-08 DIAGNOSIS — I4891 Unspecified atrial fibrillation: Secondary | ICD-10-CM

## 2014-12-08 LAB — POCT INR: INR: 2.1

## 2014-12-15 ENCOUNTER — Ambulatory Visit (INDEPENDENT_AMBULATORY_CARE_PROVIDER_SITE_OTHER): Payer: Medicare Other | Admitting: *Deleted

## 2014-12-15 DIAGNOSIS — I480 Paroxysmal atrial fibrillation: Secondary | ICD-10-CM

## 2014-12-15 DIAGNOSIS — I4891 Unspecified atrial fibrillation: Secondary | ICD-10-CM | POA: Diagnosis not present

## 2014-12-15 DIAGNOSIS — I639 Cerebral infarction, unspecified: Secondary | ICD-10-CM | POA: Diagnosis not present

## 2014-12-15 DIAGNOSIS — Z7901 Long term (current) use of anticoagulants: Secondary | ICD-10-CM

## 2014-12-15 DIAGNOSIS — Z5181 Encounter for therapeutic drug level monitoring: Secondary | ICD-10-CM

## 2014-12-15 LAB — POCT INR: INR: 3.5

## 2014-12-29 ENCOUNTER — Ambulatory Visit (INDEPENDENT_AMBULATORY_CARE_PROVIDER_SITE_OTHER): Payer: Medicare Other | Admitting: *Deleted

## 2014-12-29 DIAGNOSIS — I4891 Unspecified atrial fibrillation: Secondary | ICD-10-CM

## 2014-12-29 DIAGNOSIS — I639 Cerebral infarction, unspecified: Secondary | ICD-10-CM

## 2014-12-29 DIAGNOSIS — I480 Paroxysmal atrial fibrillation: Secondary | ICD-10-CM | POA: Diagnosis not present

## 2014-12-29 DIAGNOSIS — Z5181 Encounter for therapeutic drug level monitoring: Secondary | ICD-10-CM

## 2014-12-29 DIAGNOSIS — Z7901 Long term (current) use of anticoagulants: Secondary | ICD-10-CM | POA: Diagnosis not present

## 2014-12-29 LAB — POCT INR: INR: 2.8

## 2014-12-31 ENCOUNTER — Telehealth: Payer: Self-pay | Admitting: Cardiology

## 2014-12-31 NOTE — Telephone Encounter (Signed)
Spoke with spouse.  Wants to know if it would be ok for pt to take Keflex with coumadin.  Told him that would be a good choice.  Pt has appt with Dr Nevada Crane tomorrow and he will let me know which antibiotic they put her on for her thumb infection.

## 2014-12-31 NOTE — Telephone Encounter (Signed)
Patient has a question about an antibiotic for thumb infection.  She has some concerns since she is on Coumadin.

## 2015-01-03 ENCOUNTER — Other Ambulatory Visit: Payer: Self-pay | Admitting: Cardiology

## 2015-01-05 MED ORDER — FUROSEMIDE 40 MG PO TABS: 40.0000 mg | ORAL_TABLET | Freq: Every day | ORAL | Status: DC

## 2015-01-19 ENCOUNTER — Ambulatory Visit (INDEPENDENT_AMBULATORY_CARE_PROVIDER_SITE_OTHER): Payer: Medicare Other | Admitting: *Deleted

## 2015-01-19 DIAGNOSIS — I480 Paroxysmal atrial fibrillation: Secondary | ICD-10-CM

## 2015-01-19 DIAGNOSIS — I4891 Unspecified atrial fibrillation: Secondary | ICD-10-CM | POA: Diagnosis not present

## 2015-01-19 DIAGNOSIS — Z7901 Long term (current) use of anticoagulants: Secondary | ICD-10-CM

## 2015-01-19 DIAGNOSIS — Z5181 Encounter for therapeutic drug level monitoring: Secondary | ICD-10-CM | POA: Diagnosis not present

## 2015-01-19 DIAGNOSIS — I639 Cerebral infarction, unspecified: Secondary | ICD-10-CM | POA: Diagnosis not present

## 2015-01-19 LAB — POCT INR: INR: 2.6

## 2015-02-07 ENCOUNTER — Other Ambulatory Visit: Payer: Self-pay | Admitting: Cardiology

## 2015-02-09 ENCOUNTER — Ambulatory Visit (INDEPENDENT_AMBULATORY_CARE_PROVIDER_SITE_OTHER): Payer: Medicare Other | Admitting: *Deleted

## 2015-02-09 DIAGNOSIS — I639 Cerebral infarction, unspecified: Secondary | ICD-10-CM | POA: Diagnosis not present

## 2015-02-09 DIAGNOSIS — Z7901 Long term (current) use of anticoagulants: Secondary | ICD-10-CM | POA: Diagnosis not present

## 2015-02-09 DIAGNOSIS — I480 Paroxysmal atrial fibrillation: Secondary | ICD-10-CM

## 2015-02-09 DIAGNOSIS — I4891 Unspecified atrial fibrillation: Secondary | ICD-10-CM

## 2015-02-09 DIAGNOSIS — Z5181 Encounter for therapeutic drug level monitoring: Secondary | ICD-10-CM | POA: Diagnosis not present

## 2015-02-09 LAB — POCT INR: INR: 1.5

## 2015-02-18 ENCOUNTER — Ambulatory Visit (INDEPENDENT_AMBULATORY_CARE_PROVIDER_SITE_OTHER): Payer: Medicare Other | Admitting: *Deleted

## 2015-02-18 DIAGNOSIS — Z7901 Long term (current) use of anticoagulants: Secondary | ICD-10-CM | POA: Diagnosis not present

## 2015-02-18 DIAGNOSIS — I639 Cerebral infarction, unspecified: Secondary | ICD-10-CM | POA: Diagnosis not present

## 2015-02-18 DIAGNOSIS — Z5181 Encounter for therapeutic drug level monitoring: Secondary | ICD-10-CM | POA: Diagnosis not present

## 2015-02-18 DIAGNOSIS — I4891 Unspecified atrial fibrillation: Secondary | ICD-10-CM

## 2015-02-18 LAB — POCT INR: INR: 2.3

## 2015-03-02 ENCOUNTER — Telehealth: Payer: Self-pay | Admitting: Family

## 2015-03-02 DIAGNOSIS — N390 Urinary tract infection, site not specified: Secondary | ICD-10-CM

## 2015-03-02 MED ORDER — NITROFURANTOIN MONOHYD MACRO 100 MG PO CAPS: 100.0000 mg | ORAL_CAPSULE | Freq: Two times a day (BID) | ORAL | Status: DC

## 2015-03-02 NOTE — Progress Notes (Signed)
We are sorry that you are not feeling well.  Here is how we plan to help!  Based on what you shared with me it looks like you most likely have a simple urinary tract infection.  A UTI (Urinary Tract Infection) is a bacterial infection of the bladder.  Most cases of urinary tract infections are simple to treat but a key part of your care is to encourage you to drink plenty of fluids and watch your symptoms carefully.  I have prescribed MacroBid 100 mg twice a day for 5 days.  Your symptoms should gradually improve. Call us if the burning in your urine worsens, you develop worsening fever, back pain or pelvic pain or if your symptoms do not resolve after completing the antibiotic.  Urinary tract infections can be prevented by drinking plenty of water to keep your body hydrated.  Also be sure when you wipe, wipe from front to back and don't hold it in!  If possible, empty your bladder every 4 hours.  Your e-visit answers were reviewed by a board certified advanced clinical practitioner to complete your personal care plan.  Depending on the condition, your plan could have included both over the counter or prescription medications.  If there is a problem please reply  once you have received a response from your provider.  Your safety is important to [place].  If you have drug allergies check your prescription carefully.    You can use MyChart to ask questions about today's visit, request a non-urgent call back, or ask for a work or school excuse.  You will get an e-mail in the next two days asking about your experience.  I hope that your e-visit has been valuable and will speed your recovery. Thank you for using e-visits.

## 2015-03-04 ENCOUNTER — Ambulatory Visit (INDEPENDENT_AMBULATORY_CARE_PROVIDER_SITE_OTHER): Payer: Medicare Other | Admitting: *Deleted

## 2015-03-04 DIAGNOSIS — I639 Cerebral infarction, unspecified: Secondary | ICD-10-CM | POA: Diagnosis not present

## 2015-03-04 DIAGNOSIS — I4891 Unspecified atrial fibrillation: Secondary | ICD-10-CM | POA: Diagnosis not present

## 2015-03-04 DIAGNOSIS — Z5181 Encounter for therapeutic drug level monitoring: Secondary | ICD-10-CM | POA: Diagnosis not present

## 2015-03-04 DIAGNOSIS — Z7901 Long term (current) use of anticoagulants: Secondary | ICD-10-CM

## 2015-03-04 LAB — POCT INR: INR: 3.4

## 2015-03-16 ENCOUNTER — Other Ambulatory Visit: Payer: Self-pay | Admitting: *Deleted

## 2015-03-16 MED ORDER — WARFARIN SODIUM 2.5 MG PO TABS: ORAL_TABLET | ORAL | Status: DC

## 2015-03-25 ENCOUNTER — Ambulatory Visit (INDEPENDENT_AMBULATORY_CARE_PROVIDER_SITE_OTHER): Payer: Medicare Other | Admitting: *Deleted

## 2015-03-25 DIAGNOSIS — Z7901 Long term (current) use of anticoagulants: Secondary | ICD-10-CM | POA: Diagnosis not present

## 2015-03-25 DIAGNOSIS — I4891 Unspecified atrial fibrillation: Secondary | ICD-10-CM

## 2015-03-25 DIAGNOSIS — I639 Cerebral infarction, unspecified: Secondary | ICD-10-CM

## 2015-03-25 DIAGNOSIS — Z5181 Encounter for therapeutic drug level monitoring: Secondary | ICD-10-CM | POA: Diagnosis not present

## 2015-03-25 LAB — POCT INR: INR: 1.8

## 2015-03-30 ENCOUNTER — Ambulatory Visit (INDEPENDENT_AMBULATORY_CARE_PROVIDER_SITE_OTHER): Payer: Medicare Other | Admitting: *Deleted

## 2015-03-30 DIAGNOSIS — Z7901 Long term (current) use of anticoagulants: Secondary | ICD-10-CM | POA: Diagnosis not present

## 2015-03-30 DIAGNOSIS — I4891 Unspecified atrial fibrillation: Secondary | ICD-10-CM

## 2015-03-30 DIAGNOSIS — I639 Cerebral infarction, unspecified: Secondary | ICD-10-CM

## 2015-03-30 DIAGNOSIS — Z5181 Encounter for therapeutic drug level monitoring: Secondary | ICD-10-CM | POA: Diagnosis not present

## 2015-03-30 LAB — POCT INR: INR: 3

## 2015-03-30 MED ORDER — WARFARIN SODIUM 2.5 MG PO TABS: ORAL_TABLET | ORAL | Status: DC

## 2015-04-13 ENCOUNTER — Ambulatory Visit (INDEPENDENT_AMBULATORY_CARE_PROVIDER_SITE_OTHER): Payer: Medicare Other | Admitting: *Deleted

## 2015-04-13 DIAGNOSIS — I639 Cerebral infarction, unspecified: Secondary | ICD-10-CM | POA: Diagnosis not present

## 2015-04-13 DIAGNOSIS — I4891 Unspecified atrial fibrillation: Secondary | ICD-10-CM | POA: Diagnosis not present

## 2015-04-13 DIAGNOSIS — Z7901 Long term (current) use of anticoagulants: Secondary | ICD-10-CM | POA: Diagnosis not present

## 2015-04-13 DIAGNOSIS — Z5181 Encounter for therapeutic drug level monitoring: Secondary | ICD-10-CM

## 2015-04-13 LAB — POCT INR: INR: 3.2

## 2015-04-22 ENCOUNTER — Telehealth: Payer: Self-pay | Admitting: *Deleted

## 2015-04-22 NOTE — Telephone Encounter (Signed)
Pt is taking an antibiotic-Doxycycline- for an infection in her thumb

## 2015-04-23 ENCOUNTER — Ambulatory Visit (INDEPENDENT_AMBULATORY_CARE_PROVIDER_SITE_OTHER): Payer: Medicare Other | Admitting: Cardiology

## 2015-04-23 ENCOUNTER — Encounter: Payer: Self-pay | Admitting: Cardiology

## 2015-04-23 VITALS — BP 118/64 | HR 87 | Ht 62.0 in | Wt 160.8 lb

## 2015-04-23 DIAGNOSIS — I38 Endocarditis, valve unspecified: Secondary | ICD-10-CM | POA: Diagnosis not present

## 2015-04-23 DIAGNOSIS — I480 Paroxysmal atrial fibrillation: Secondary | ICD-10-CM

## 2015-04-23 NOTE — Patient Instructions (Signed)
Your physician wants you to follow-up in: 6 months with Dr.Branch You will receive a reminder letter in the mail two months in advance. If you don't receive a letter, please call our office to schedule the follow-up appointment.   Your physician recommends that you continue on your current medications as directed. Please refer to the Current Medication list given to you today.    Thank you for choosing [facility] !        

## 2015-04-23 NOTE — Progress Notes (Signed)
Patient ID: DEICY RUSK, female   DOB: 20-Mar-1946, 69 y.o.   MRN: [id number]     Clinical Summary Ms. Winkowski is a 69 y.o.female seen today for follow up of the following medical problems.   1. Valvular heart disease  - 07/18/13 patient underwent MVR with 25 mm Edwards Magna-ease pericardial valve and also AVR with 21 mm Edwards Magna-ease pericardial valve    - denies any SOB or DOE. Notes some LE edema at times. Weights at home around low 160s and stable - compliant with meds   2. Afib  - hx of cardioembolic CVA, has been on longterm anticoag - denies any palpitations - compliant with coumadin, denies any issues      Past Medical History  Diagnosis Date  . Rheumatic heart disease     a. 07/2013 Echo: Ef 55-60%, Mod AS/AI, Mod-Sev MS, sev dil LA, PASP 58;  b. 07/2013 TEE: EF 35-40%, mild-mod AS/AI, mod-sev MS, LA smoke;  c. 07/2013 Cath: elev R heart pressures, nl cors.  . Gastroesophageal reflux disease   . Hyperlipidemia   . Cardioembolic stroke 12/6627    Right frontal in 01/2012; normal carotid ultrasound; possible LAA thrombus by TEE; virtual complete neurologic recovery  . Fasting hyperglycemia     120 fasting  . Breast carcinoma 1995    1995  . Depression   . Diverticulosis of colon (without mention of hemorrhage) 2012    Dr. Laural Golden  . Gastroparesis   . Hemorrhoids   . Anemia   . Shortness of breath   . Hypertension     pt denies 05/30/13     Dr Kathe Mariner  . Atrial fibrillation   . Hyponatremia   . Chronic kidney disease, stage 2, mildly decreased GFR     GFR of approximately 60     Allergies  Allergen Reactions  . Amiodarone Rash     Current Outpatient Prescriptions  Medication Sig Dispense Refill  . albuterol (VENTOLIN HFA) 108 (90 BASE) MCG/ACT inhaler Inhale 2 puffs into the lungs every 4 (four) hours as needed for wheezing or shortness of breath.     Marland Kitchen atorvastatin (LIPITOR) 20 MG tablet Take 20 mg by mouth every evening.     Marland Kitchen  buPROPion (WELLBUTRIN XL) 150 MG 24 hr tablet Take 150 mg by mouth daily.      Marland Kitchen diltiazem (CARDIZEM CD) 180 MG 24 hr capsule Take 1 capsule (180 mg total) by mouth daily. 30 capsule 1  . fluticasone (FLONASE) 50 MCG/ACT nasal spray Place 1 spray into both nostrils daily as needed.     . furosemide (LASIX) 40 MG tablet Take 1 tablet (40 mg total) by mouth daily. *May take additional tablet at bedtime as needed for swelling 180 tablet 3  . levothyroxine (SYNTHROID, LEVOTHROID) 112 MCG tablet Take 1 tablet by mouth daily.    Marland Kitchen losartan (COZAAR) 50 MG tablet Take 50 mg by mouth daily.    . metFORMIN (GLUCOPHAGE) 500 MG tablet Take 1 tablet (500 mg total) by mouth 2 (two) times daily with a meal. 60 tablet 1  . metoprolol tartrate (LOPRESSOR) 25 MG tablet TAKE ONE TABLET BY MOUTH TWICE A DAY 180 tablet 3  . Multiple Vitamin (MULITIVITAMIN WITH MINERALS) TABS Take 1 tablet by mouth daily.    . nitrofurantoin, macrocrystal-monohydrate, (MACROBID) 100 MG capsule Take 1 capsule (100 mg total) by mouth 2 (two) times daily. 10 capsule 0  . potassium chloride SA (K-DUR,KLOR-CON) 20 MEQ tablet Take 20  mEq by mouth daily.    Marland Kitchen saccharomyces boulardii (FLORASTOR) 250 MG capsule Take 250 mg by mouth daily.    Marland Kitchen warfarin (COUMADIN) 2.5 MG tablet Take as directed by coumadin clinic 70 tablet 3   No current facility-administered medications for this visit.     Past Surgical History  Procedure Laterality Date  . Mitral valve surgery  [facility], closed mitral valvulotomy by finger fracture  . Cardiac catheterization    . Dilation and curettage of uterus    . Breast lumpectomy Right 1995  . Tubal ligation  1973  . Tee without cardioversion  01/24/2012    Procedure: TRANSESOPHAGEAL ECHOCARDIOGRAM (TEE);  Surgeon: Lelon Perla, MD;  Location: [facility] ENDOSCOPY;  Service: Cardiovascular;  Laterality: N/A;  . Colonoscopy  2012    Negative screening procedure  . Esophageal manometry N/A 06/17/2013     Procedure: ESOPHAGEAL MANOMETRY (EM);  Surgeon: Sable Feil, MD;  Location: [facility] ENDOSCOPY;  Service: Endoscopy;  Laterality: N/A;  . Tee without cardioversion N/A 07/11/2013    Procedure: TRANSESOPHAGEAL ECHOCARDIOGRAM (TEE);  Surgeon: Thayer Headings, MD;  Location: [place];  Service: Cardiovascular;  Laterality: N/A;  . Aortic valve replacement N/A 07/18/2013    Procedure: AORTIC VALVE REPLACEMENT (AVR);  Surgeon: Gaye Pollack, MD;  Location: [place];  Service: Open Heart Surgery;  Laterality: N/A;  . Intraoperative transesophageal echocardiogram N/A 07/18/2013    Procedure: INTRAOPERATIVE TRANSESOPHAGEAL ECHOCARDIOGRAM;  Surgeon: Gaye Pollack, MD;  Location: [facility] OR;  Service: Open Heart Surgery;  Laterality: N/A;  . Mitral valve replacement N/A 07/18/2013    Procedure: MITRAL VALVE (MV) REPLACEMENT;  Surgeon: Gaye Pollack, MD;  Location: [place] OR;  Service: Open Heart Surgery;  Laterality: N/A;  . Tee without cardioversion N/A 07/26/2013    Procedure: TRANSESOPHAGEAL ECHOCARDIOGRAM (TEE);  Surgeon: Fay Records, MD;  Location: [place];  Service: Cardiovascular;  Laterality: N/A;  . Cardioversion N/A 07/26/2013    Procedure: CARDIOVERSION;  Surgeon: Fay Records, MD;  Location: [facility] ENDOSCOPY;  Service: Cardiovascular;  Laterality: N/A;  . Lumbar laminectomy/decompression microdiscectomy Right 05/13/2014    Procedure: LUMBAR LAMINECTOMY/DECOMPRESSION MICRODISCECTOMY 1 LEVEL  lumbar four/five;  Surgeon: Faythe Ghee, MD;  Location: [facility] NEURO ORS;  Service: Neurosurgery;  Laterality: Right;  . Back surgery    . Left and right heart catheterization with coronary angiogram N/A 07/10/2013    Procedure: LEFT AND RIGHT HEART CATHETERIZATION WITH CORONARY ANGIOGRAM;  Surgeon: Burnell Blanks, MD;  Location: [facility] CATH LAB;  Service: Cardiovascular;  Laterality: N/A;     Allergies  Allergen Reactions  . Amiodarone Rash      Family History  Problem Relation Age of Onset  . Heart  disease Brother 44    MI  . Rheum arthritis Maternal Grandmother   . Asthma Maternal Grandfather   . Hypothyroidism Mother   . Diabetes Sister   . Cirrhosis Father   . Cervical cancer Sister     malignant neoplasm of cervix uteri     Social History Ms. Sassaman reports that she has never smoked. She has never used smokeless tobacco. Ms. Doke reports that she does not drink alcohol.   Review of Systems CONSTITUTIONAL: No weight loss, fever, chills, weakness or fatigue.  HEENT: Eyes: No visual loss, blurred vision, double vision or yellow sclerae.No hearing loss, sneezing, congestion, runny nose or sore throat.  SKIN: No rash or itching.  CARDIOVASCULAR: per HPI RESPIRATORY: No shortness of breath, cough or  sputum.  GASTROINTESTINAL: No anorexia, nausea, vomiting or diarrhea. No abdominal pain or blood.  GENITOURINARY: No burning on urination, no polyuria NEUROLOGICAL: No headache, dizziness, syncope, paralysis, ataxia, numbness or tingling in the extremities. No change in bowel or bladder control.  MUSCULOSKELETAL: No muscle, back pain, joint pain or stiffness.  LYMPHATICS: No enlarged nodes. No history of splenectomy.  PSYCHIATRIC: No history of depression or anxiety.  ENDOCRINOLOGIC: No reports of sweating, cold or heat intolerance. No polyuria or polydipsia.  Marland Kitchen   Physical Examination Filed Vitals:   04/23/15 0958  BP: 118/64  Pulse: 87   Filed Vitals:   04/23/15 0958  Height: 5\' 2"  (1.575 m)  Weight: 160 lb 12.8 oz (72.938 kg)    Gen: resting comfortably, no acute distress HEENT: no scleral icterus, pupils equal round and reactive, no palptable cervical adenopathy,  CV: RRR, no m/r/g, no JVD, no carotid bruits Resp: Clear to auscultation bilaterally GI: abdomen is soft, non-tender, non-distended, normal bowel sounds, no hepatosplenomegaly MSK: extremities are warm, no edema.  Skin: warm, no rash Neuro:  no focal deficits Psych: appropriate  affect   Diagnostic Studies 07/2013 TTE Study Conclusions  - Study data: Technically difficult study. - Left ventricle: The cavity size was normal. Wall thickness was normal. Systolic function was normal. The estimated ejection fraction was in the range of 55% to 60%. Indeterminate diastolic function. There is evidence of very high left atrial pressures (E/e' 63) - Aortic valve: Trileaflet; mildly thickened leaflets. Accurate evaluation of aortic stenosis limited in the setting of severe mitral stenosis and decreased ventricular loadin. Morphologically there does not appear to be significant stenosis. By gradient (mean PG 21 mmHg) the stenosis is moderate, by AVA VTI (0.8) stenosis is severe. Moderate regurgitation, AR PHT is 439 cm/s. Valve area: 0.78cm^2(VTI). Valve area: 0.78cm^2 (Vmax). - Mitral valve: The findings are consistent with moderate to severe stenosis. (Mean PG 12 mmHg, MVA by PHT 1.3. Cannot use MVA by VTI due to moderate AI. Valve area by pressure half-time: 1.38cm^2. - Left atrium: The atrium was severely dilated. - Right ventricle: Systolic function was mildly reduced, RV TAPSE is 1.4, TV systolic annular tissue velocity 10 cm/s. - Pulmonary arteries: Systolic pressure was moderately increased. PA peak pressure: 62mm Hg (S) (PASP based on a fairly faint spectral Doppler waveform)   07/2013 TEE Study Conclusions  - Left ventricle: Systolic function was moderately reduced. The estimated ejection fraction was in the range of 35% to 40%. - Aortic valve: There was mild to moderate stenosis. Mild to moderate regurgitation. - Mitral valve: The findings are consistent with moderate to severe stenosis. Valve area by pressure half-time: 1.45cm^2. - Left atrium: There was spontaneous echo contrast ("smoke"). - Atrial septum: No defect or patent foramen ovale was identified. - Tricuspid valve: No evidence of  vegetation. Transesophageal echocardiography. 2D and color Doppler. Height: Height: 157.5cm. Height: 62in. Weight: Weight: 75kg. Weight: 165lb. Body mass index: BMI: 30.2kg/m^2. Body surface area:  BSA: 1.74m^2. Blood pressure: 143/56. Patient status: Inpatient. Location: Endoscopy.       Assessment and Plan  1. Valvular heart disease  - history of mitral and aortic stenosis, s/p tissue valve replacement of both valves  - no current symptoms, continue to follow  2. Afib  - denies any significant symptoms - hx of prior cardioembolic CVA, continue anticoag indefintitely      Arnoldo Lenis, M.D.

## 2015-04-23 NOTE — Telephone Encounter (Signed)
Pt is starting Doxycycline 100mg  daily tonight for infection nail bed Rt thumb.  Will finish on 7/30.  Told pt to decrease coumadin to 5mg  daily except 2.5mg  on Saturday and Wednesday until INR check on 05/04/15.  Pt verbalized understanding.

## 2015-05-04 ENCOUNTER — Ambulatory Visit (INDEPENDENT_AMBULATORY_CARE_PROVIDER_SITE_OTHER): Payer: Medicare Other | Admitting: *Deleted

## 2015-05-04 DIAGNOSIS — Z5181 Encounter for therapeutic drug level monitoring: Secondary | ICD-10-CM | POA: Diagnosis not present

## 2015-05-04 DIAGNOSIS — I4891 Unspecified atrial fibrillation: Secondary | ICD-10-CM | POA: Diagnosis not present

## 2015-05-04 DIAGNOSIS — I639 Cerebral infarction, unspecified: Secondary | ICD-10-CM

## 2015-05-04 DIAGNOSIS — Z7901 Long term (current) use of anticoagulants: Secondary | ICD-10-CM | POA: Diagnosis not present

## 2015-05-04 LAB — POCT INR: INR: 2.5

## 2015-05-27 ENCOUNTER — Ambulatory Visit (INDEPENDENT_AMBULATORY_CARE_PROVIDER_SITE_OTHER): Payer: Medicare Other | Admitting: *Deleted

## 2015-05-27 DIAGNOSIS — Z5181 Encounter for therapeutic drug level monitoring: Secondary | ICD-10-CM

## 2015-05-27 DIAGNOSIS — I639 Cerebral infarction, unspecified: Secondary | ICD-10-CM

## 2015-05-27 DIAGNOSIS — I4891 Unspecified atrial fibrillation: Secondary | ICD-10-CM

## 2015-05-27 DIAGNOSIS — Z7901 Long term (current) use of anticoagulants: Secondary | ICD-10-CM | POA: Diagnosis not present

## 2015-05-27 LAB — POCT INR: INR: 4.4

## 2015-06-12 ENCOUNTER — Ambulatory Visit (INDEPENDENT_AMBULATORY_CARE_PROVIDER_SITE_OTHER): Payer: Medicare Other | Admitting: *Deleted

## 2015-06-12 DIAGNOSIS — I4891 Unspecified atrial fibrillation: Secondary | ICD-10-CM

## 2015-06-12 DIAGNOSIS — Z5181 Encounter for therapeutic drug level monitoring: Secondary | ICD-10-CM

## 2015-06-12 DIAGNOSIS — I639 Cerebral infarction, unspecified: Secondary | ICD-10-CM

## 2015-06-12 DIAGNOSIS — Z7901 Long term (current) use of anticoagulants: Secondary | ICD-10-CM | POA: Diagnosis not present

## 2015-06-12 LAB — POCT INR: INR: 3.2

## 2015-07-08 ENCOUNTER — Ambulatory Visit (INDEPENDENT_AMBULATORY_CARE_PROVIDER_SITE_OTHER): Payer: Medicare Other | Admitting: *Deleted

## 2015-07-08 DIAGNOSIS — Z5181 Encounter for therapeutic drug level monitoring: Secondary | ICD-10-CM

## 2015-07-08 DIAGNOSIS — Z7901 Long term (current) use of anticoagulants: Secondary | ICD-10-CM | POA: Diagnosis not present

## 2015-07-08 DIAGNOSIS — I4891 Unspecified atrial fibrillation: Secondary | ICD-10-CM

## 2015-07-08 DIAGNOSIS — I639 Cerebral infarction, unspecified: Secondary | ICD-10-CM

## 2015-07-08 LAB — POCT INR: INR: 5.8

## 2015-07-15 ENCOUNTER — Ambulatory Visit (INDEPENDENT_AMBULATORY_CARE_PROVIDER_SITE_OTHER): Payer: Medicare Other | Admitting: *Deleted

## 2015-07-15 DIAGNOSIS — Z7901 Long term (current) use of anticoagulants: Secondary | ICD-10-CM | POA: Diagnosis not present

## 2015-07-15 DIAGNOSIS — Z5181 Encounter for therapeutic drug level monitoring: Secondary | ICD-10-CM

## 2015-07-15 DIAGNOSIS — I4891 Unspecified atrial fibrillation: Secondary | ICD-10-CM | POA: Diagnosis not present

## 2015-07-15 DIAGNOSIS — I639 Cerebral infarction, unspecified: Secondary | ICD-10-CM

## 2015-07-15 LAB — POCT INR: INR: 2.8

## 2015-07-31 ENCOUNTER — Other Ambulatory Visit: Payer: Self-pay | Admitting: Cardiology

## 2015-08-03 ENCOUNTER — Other Ambulatory Visit: Payer: Self-pay | Admitting: Cardiology

## 2015-08-05 ENCOUNTER — Ambulatory Visit (INDEPENDENT_AMBULATORY_CARE_PROVIDER_SITE_OTHER): Payer: Medicare Other | Admitting: *Deleted

## 2015-08-05 DIAGNOSIS — I639 Cerebral infarction, unspecified: Secondary | ICD-10-CM | POA: Diagnosis not present

## 2015-08-05 DIAGNOSIS — I480 Paroxysmal atrial fibrillation: Secondary | ICD-10-CM | POA: Diagnosis not present

## 2015-08-05 DIAGNOSIS — Z7901 Long term (current) use of anticoagulants: Secondary | ICD-10-CM

## 2015-08-05 DIAGNOSIS — I4891 Unspecified atrial fibrillation: Secondary | ICD-10-CM | POA: Diagnosis not present

## 2015-08-05 DIAGNOSIS — Z5181 Encounter for therapeutic drug level monitoring: Secondary | ICD-10-CM

## 2015-08-05 LAB — POCT INR: INR: 6.7

## 2015-08-12 ENCOUNTER — Ambulatory Visit (INDEPENDENT_AMBULATORY_CARE_PROVIDER_SITE_OTHER): Payer: Medicare Other | Admitting: *Deleted

## 2015-08-12 DIAGNOSIS — I4891 Unspecified atrial fibrillation: Secondary | ICD-10-CM

## 2015-08-12 DIAGNOSIS — Z7901 Long term (current) use of anticoagulants: Secondary | ICD-10-CM | POA: Diagnosis not present

## 2015-08-12 DIAGNOSIS — I639 Cerebral infarction, unspecified: Secondary | ICD-10-CM | POA: Diagnosis not present

## 2015-08-12 DIAGNOSIS — Z5181 Encounter for therapeutic drug level monitoring: Secondary | ICD-10-CM | POA: Diagnosis not present

## 2015-08-12 LAB — POCT INR: INR: 1.9

## 2015-08-26 ENCOUNTER — Ambulatory Visit (INDEPENDENT_AMBULATORY_CARE_PROVIDER_SITE_OTHER): Payer: Medicare Other | Admitting: *Deleted

## 2015-08-26 DIAGNOSIS — Z5181 Encounter for therapeutic drug level monitoring: Secondary | ICD-10-CM | POA: Diagnosis not present

## 2015-08-26 DIAGNOSIS — Z7901 Long term (current) use of anticoagulants: Secondary | ICD-10-CM | POA: Diagnosis not present

## 2015-08-26 DIAGNOSIS — I4891 Unspecified atrial fibrillation: Secondary | ICD-10-CM | POA: Diagnosis not present

## 2015-08-26 DIAGNOSIS — I639 Cerebral infarction, unspecified: Secondary | ICD-10-CM | POA: Diagnosis not present

## 2015-08-26 LAB — POCT INR: INR: 7.7

## 2015-08-31 ENCOUNTER — Ambulatory Visit (INDEPENDENT_AMBULATORY_CARE_PROVIDER_SITE_OTHER): Payer: Medicare Other | Admitting: *Deleted

## 2015-08-31 DIAGNOSIS — I639 Cerebral infarction, unspecified: Secondary | ICD-10-CM

## 2015-08-31 DIAGNOSIS — I4891 Unspecified atrial fibrillation: Secondary | ICD-10-CM | POA: Diagnosis not present

## 2015-08-31 DIAGNOSIS — Z7901 Long term (current) use of anticoagulants: Secondary | ICD-10-CM

## 2015-08-31 DIAGNOSIS — Z5181 Encounter for therapeutic drug level monitoring: Secondary | ICD-10-CM

## 2015-08-31 LAB — POCT INR: INR: 1.3

## 2015-09-09 ENCOUNTER — Ambulatory Visit (INDEPENDENT_AMBULATORY_CARE_PROVIDER_SITE_OTHER): Payer: Medicare Other | Admitting: *Deleted

## 2015-09-09 DIAGNOSIS — Z5181 Encounter for therapeutic drug level monitoring: Secondary | ICD-10-CM | POA: Diagnosis not present

## 2015-09-09 DIAGNOSIS — I639 Cerebral infarction, unspecified: Secondary | ICD-10-CM | POA: Diagnosis not present

## 2015-09-09 DIAGNOSIS — Z7901 Long term (current) use of anticoagulants: Secondary | ICD-10-CM

## 2015-09-09 DIAGNOSIS — I4891 Unspecified atrial fibrillation: Secondary | ICD-10-CM

## 2015-09-09 DIAGNOSIS — I480 Paroxysmal atrial fibrillation: Secondary | ICD-10-CM

## 2015-09-09 LAB — POCT INR: INR: 2.8

## 2015-09-16 ENCOUNTER — Ambulatory Visit (INDEPENDENT_AMBULATORY_CARE_PROVIDER_SITE_OTHER): Payer: Medicare Other | Admitting: *Deleted

## 2015-09-16 DIAGNOSIS — Z7901 Long term (current) use of anticoagulants: Secondary | ICD-10-CM | POA: Diagnosis not present

## 2015-09-16 DIAGNOSIS — Z5181 Encounter for therapeutic drug level monitoring: Secondary | ICD-10-CM

## 2015-09-16 DIAGNOSIS — I639 Cerebral infarction, unspecified: Secondary | ICD-10-CM

## 2015-09-16 DIAGNOSIS — I4891 Unspecified atrial fibrillation: Secondary | ICD-10-CM | POA: Diagnosis not present

## 2015-09-16 LAB — POCT INR: INR: 3

## 2015-09-30 ENCOUNTER — Ambulatory Visit (INDEPENDENT_AMBULATORY_CARE_PROVIDER_SITE_OTHER): Payer: Medicare Other | Admitting: *Deleted

## 2015-09-30 DIAGNOSIS — Z5181 Encounter for therapeutic drug level monitoring: Secondary | ICD-10-CM

## 2015-09-30 DIAGNOSIS — I639 Cerebral infarction, unspecified: Secondary | ICD-10-CM | POA: Diagnosis not present

## 2015-09-30 DIAGNOSIS — I4891 Unspecified atrial fibrillation: Secondary | ICD-10-CM | POA: Diagnosis not present

## 2015-09-30 DIAGNOSIS — Z7901 Long term (current) use of anticoagulants: Secondary | ICD-10-CM | POA: Diagnosis not present

## 2015-09-30 LAB — POCT INR: INR: 6.1

## 2015-10-03 IMAGING — CR DG CHEST 2V
2 series · 2 of 2 positions shown · non-contrast
Comparison: 08/03/2013

CLINICAL DATA: Follow-up aortic valve replacement

EXAM:
CHEST  2 VIEW

[w chest pa]
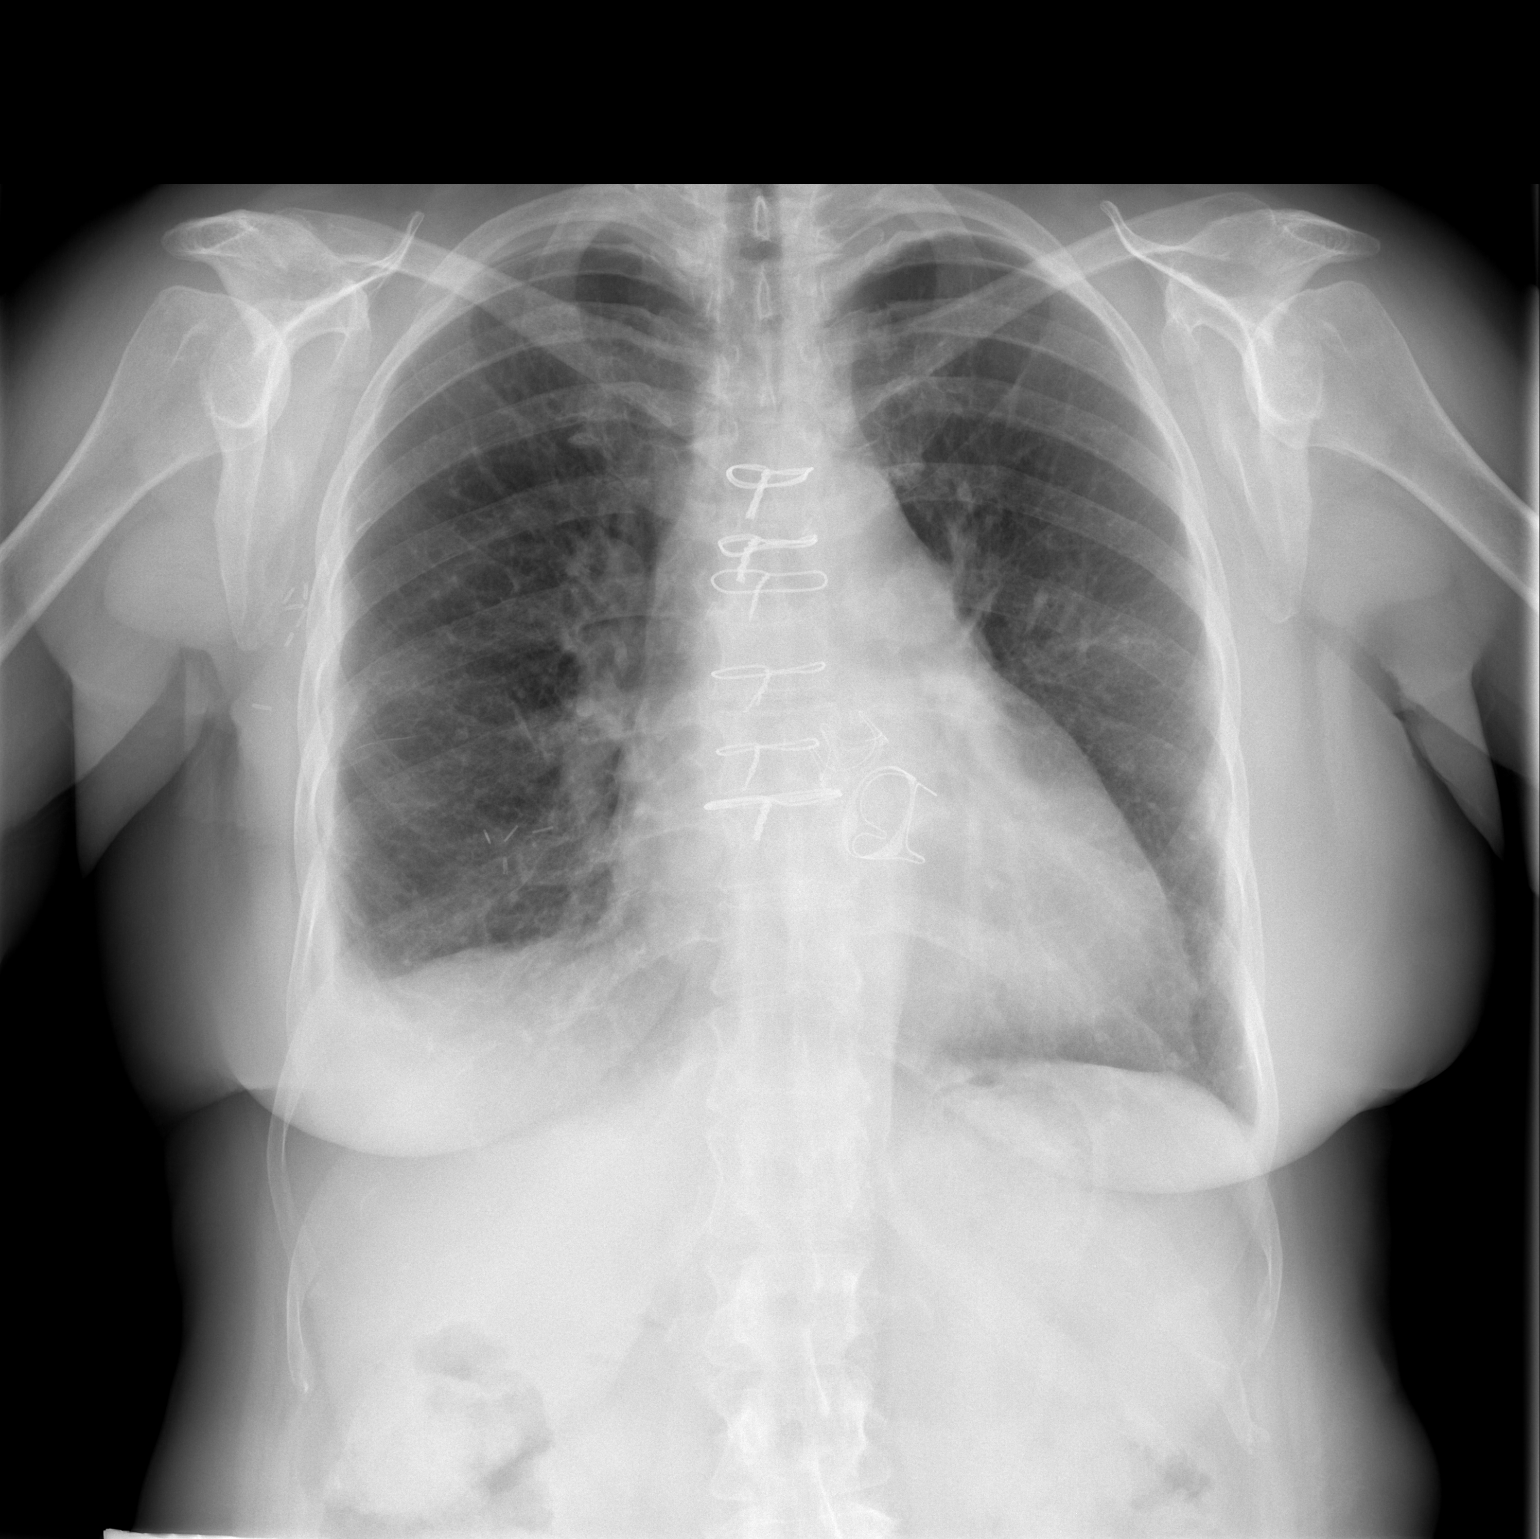

[w chest lat]
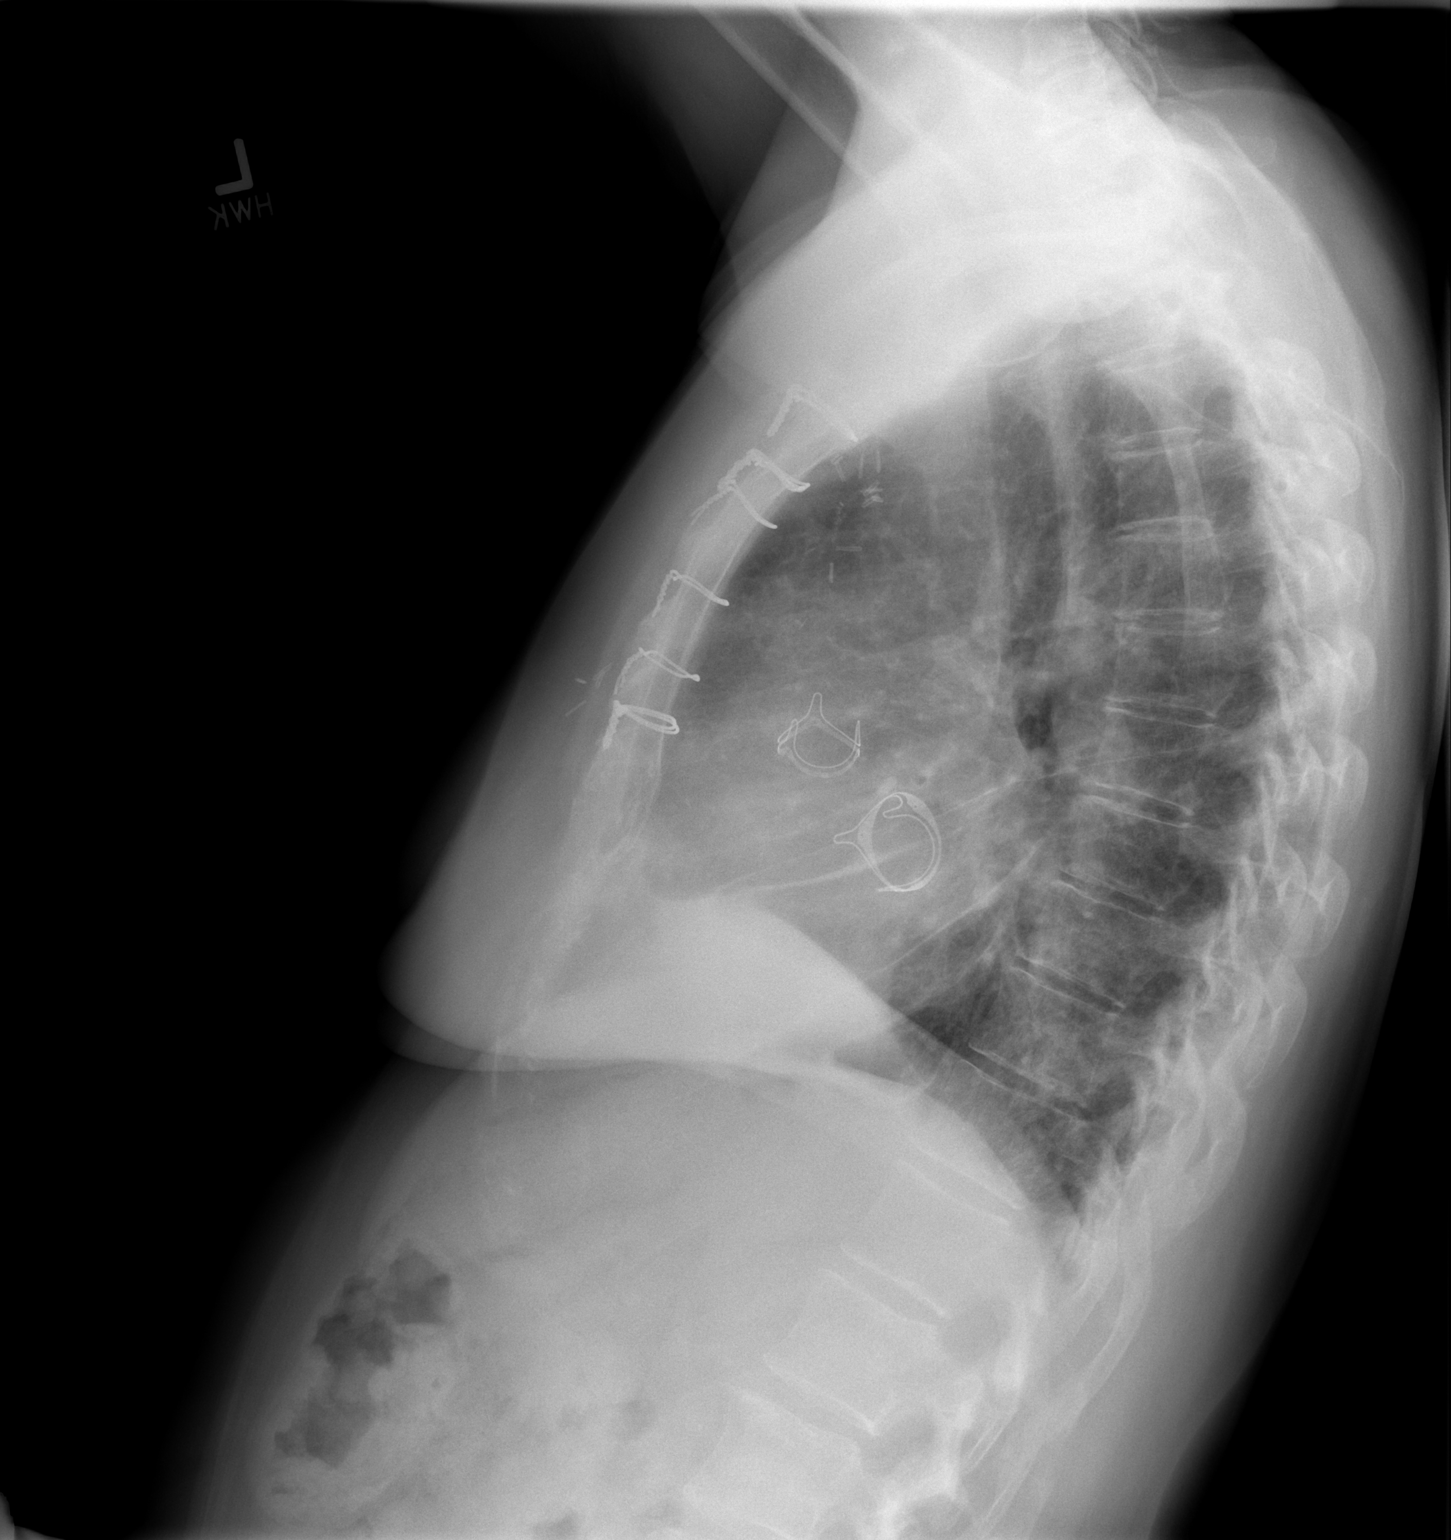

[2 of 2 positions shown; findings below may reference images not displayed]

FINDINGS: Mild patchy right basilar opacity, likely atelectasis, with
associated small right pleural effusion. This appearance is
improved.

No pneumothorax.

The heart is top-normal in size.  Prosthetic valves.

Postsurgical changes in the right chest wall/axilla.

Visualized osseous structures are within normal limits.
IMPRESSION: Improving right basilar atelectasis with small right pleural
effusion.

No pneumothorax.

## 2015-10-07 ENCOUNTER — Ambulatory Visit (INDEPENDENT_AMBULATORY_CARE_PROVIDER_SITE_OTHER): Payer: Medicare Other | Admitting: *Deleted

## 2015-10-07 DIAGNOSIS — I639 Cerebral infarction, unspecified: Secondary | ICD-10-CM | POA: Diagnosis not present

## 2015-10-07 DIAGNOSIS — I4891 Unspecified atrial fibrillation: Secondary | ICD-10-CM | POA: Diagnosis not present

## 2015-10-07 DIAGNOSIS — I480 Paroxysmal atrial fibrillation: Secondary | ICD-10-CM | POA: Diagnosis not present

## 2015-10-07 DIAGNOSIS — Z5181 Encounter for therapeutic drug level monitoring: Secondary | ICD-10-CM | POA: Diagnosis not present

## 2015-10-07 DIAGNOSIS — Z7901 Long term (current) use of anticoagulants: Secondary | ICD-10-CM

## 2015-10-07 LAB — POCT INR: INR: 1.7

## 2015-10-14 ENCOUNTER — Ambulatory Visit (INDEPENDENT_AMBULATORY_CARE_PROVIDER_SITE_OTHER): Payer: Medicare Other | Admitting: *Deleted

## 2015-10-14 DIAGNOSIS — I639 Cerebral infarction, unspecified: Secondary | ICD-10-CM | POA: Diagnosis not present

## 2015-10-14 DIAGNOSIS — Z5181 Encounter for therapeutic drug level monitoring: Secondary | ICD-10-CM | POA: Diagnosis not present

## 2015-10-14 DIAGNOSIS — I4891 Unspecified atrial fibrillation: Secondary | ICD-10-CM

## 2015-10-14 DIAGNOSIS — Z7901 Long term (current) use of anticoagulants: Secondary | ICD-10-CM

## 2015-10-14 LAB — POCT INR: INR: 2.7

## 2015-10-22 ENCOUNTER — Other Ambulatory Visit (HOSPITAL_COMMUNITY): Payer: Self-pay | Admitting: Internal Medicine

## 2015-10-22 DIAGNOSIS — Z1231 Encounter for screening mammogram for malignant neoplasm of breast: Secondary | ICD-10-CM

## 2015-11-02 ENCOUNTER — Ambulatory Visit (INDEPENDENT_AMBULATORY_CARE_PROVIDER_SITE_OTHER): Payer: Medicare Other | Admitting: *Deleted

## 2015-11-02 DIAGNOSIS — I480 Paroxysmal atrial fibrillation: Secondary | ICD-10-CM | POA: Diagnosis not present

## 2015-11-02 DIAGNOSIS — I639 Cerebral infarction, unspecified: Secondary | ICD-10-CM

## 2015-11-02 DIAGNOSIS — Z7901 Long term (current) use of anticoagulants: Secondary | ICD-10-CM | POA: Diagnosis not present

## 2015-11-02 DIAGNOSIS — Z5181 Encounter for therapeutic drug level monitoring: Secondary | ICD-10-CM | POA: Diagnosis not present

## 2015-11-02 DIAGNOSIS — I4891 Unspecified atrial fibrillation: Secondary | ICD-10-CM

## 2015-11-02 LAB — POCT INR: INR: 4.3

## 2015-11-09 ENCOUNTER — Encounter: Payer: Self-pay | Admitting: Cardiology

## 2015-11-09 ENCOUNTER — Ambulatory Visit (INDEPENDENT_AMBULATORY_CARE_PROVIDER_SITE_OTHER): Payer: Medicare Other | Admitting: Cardiology

## 2015-11-09 ENCOUNTER — Ambulatory Visit (INDEPENDENT_AMBULATORY_CARE_PROVIDER_SITE_OTHER): Payer: Medicare Other | Admitting: *Deleted

## 2015-11-09 VITALS — BP 124/58 | HR 76 | Ht 62.5 in | Wt 169.0 lb

## 2015-11-09 DIAGNOSIS — Z5181 Encounter for therapeutic drug level monitoring: Secondary | ICD-10-CM | POA: Diagnosis not present

## 2015-11-09 DIAGNOSIS — I4891 Unspecified atrial fibrillation: Secondary | ICD-10-CM

## 2015-11-09 DIAGNOSIS — Z7901 Long term (current) use of anticoagulants: Secondary | ICD-10-CM

## 2015-11-09 DIAGNOSIS — I639 Cerebral infarction, unspecified: Secondary | ICD-10-CM | POA: Diagnosis not present

## 2015-11-09 DIAGNOSIS — I38 Endocarditis, valve unspecified: Secondary | ICD-10-CM | POA: Diagnosis not present

## 2015-11-09 LAB — POCT INR: INR: 3.8

## 2015-11-09 NOTE — Progress Notes (Signed)
Patient ID: [name], female   DOB: 17-Dec-1945, 70 y.o.   MRN: [id number]     Clinical Summary [name] is a 70 y.o.female seen today for follow up of the following medical problems.   1. Valvular heart disease  - 07/18/13 patient underwent MVR with 25 mm Edwards Magna-ease pericardial valve and also AVR with 21 mm Edwards Magna-ease pericardial valve   - denies and SOB/DOE. No LE edema.She remains very active, she and her husband enjoying playing golf together and walking the courses.  Compliant with meds  2. Afib  - hx of cardioembolic CVA, has been on longterm anticoag - denies any palpitations - no bleeding troubles on coumadin  3. Hyperlipidemia - reports recent labs with Dr Willey Blade - compliant with statin.     Past Medical History  Diagnosis Date  . Rheumatic heart disease     a. 07/2013 Echo: Ef 55-60%, Mod AS/AI, Mod-Sev MS, sev dil LA, PASP 58;  b. 07/2013 TEE: EF 35-40%, mild-mod AS/AI, mod-sev MS, LA smoke;  c. 07/2013 Cath: elev R heart pressures, nl cors.  . Gastroesophageal reflux disease   . Hyperlipidemia   . Cardioembolic stroke ([place]) [id number]    Right frontal in 01/2012; normal carotid ultrasound; possible LAA thrombus by TEE; virtual complete neurologic recovery  . Fasting hyperglycemia     120 fasting  . Breast carcinoma ([place]) 1995    1995  . Depression   . Diverticulosis of colon (without mention of hemorrhage) 2012    Dr. Laural Golden  . Gastroparesis   . Hemorrhoids   . Anemia   . Shortness of breath   . Hypertension     pt denies 05/30/13     Dr Kathe Mariner  . Atrial fibrillation ([place])   . Hyponatremia   . Chronic kidney disease, stage 2, mildly decreased GFR     GFR of approximately 60     Allergies  Allergen Reactions  . Amiodarone Rash     Current Outpatient Prescriptions  Medication Sig Dispense Refill  . albuterol (VENTOLIN HFA) 108 (90 BASE) MCG/ACT inhaler Inhale 2 puffs into the lungs every 4 (four) hours as needed for  wheezing or shortness of breath.     Marland Kitchen atorvastatin (LIPITOR) 20 MG tablet Take 20 mg by mouth every evening.     . diltiazem (CARDIZEM CD) 180 MG 24 hr capsule Take 1 capsule (180 mg total) by mouth daily. 30 capsule 1  . escitalopram (LEXAPRO) 10 MG tablet Take 10 mg by mouth daily.     . fexofenadine (ALLEGRA) 30 MG tablet Take 30 mg by mouth 2 (two) times daily.    . fluticasone (FLONASE) 50 MCG/ACT nasal spray Place 1 spray into both nostrils daily as needed.     . furosemide (LASIX) 40 MG tablet Take 1 tablet (40 mg total) by mouth daily. *May take additional tablet at bedtime as needed for swelling 180 tablet 3  . levothyroxine (SYNTHROID, LEVOTHROID) 112 MCG tablet Take 1 tablet by mouth daily.    Marland Kitchen losartan (COZAAR) 50 MG tablet Take 50 mg by mouth daily.    . metFORMIN (GLUCOPHAGE) 500 MG tablet Take 1 tablet (500 mg total) by mouth 2 (two) times daily with a meal. 60 tablet 1  . metoprolol tartrate (LOPRESSOR) 25 MG tablet TAKE ONE TABLET BY MOUTH TWICE A DAY 180 tablet 3  . Multiple Vitamin (MULITIVITAMIN WITH MINERALS) TABS Take 1 tablet by mouth daily.    . potassium chloride SA (  K-DUR,KLOR-CON) 20 MEQ tablet Take 20 mEq by mouth daily.    Marland Kitchen QVAR 80 MCG/ACT inhaler     . saccharomyces boulardii (FLORASTOR) 250 MG capsule Take 250 mg by mouth daily.    Marland Kitchen warfarin (COUMADIN) 2.5 MG tablet Take as directed by coumadin clinic 70 tablet 3  . warfarin (COUMADIN) 2.5 MG tablet Take 2 tablets daily 65 tablet 6   No current facility-administered medications for this visit.     Past Surgical History  Procedure Laterality Date  . Mitral valve surgery  [facility], closed mitral valvulotomy by finger fracture  . Cardiac catheterization    . Dilation and curettage of uterus    . Breast lumpectomy Right 1995  . Tubal ligation  1973  . Tee without cardioversion  01/24/2012    Procedure: TRANSESOPHAGEAL ECHOCARDIOGRAM (TEE);  Surgeon: Lelon Perla, MD;  Location: [facility] ENDOSCOPY;   Service: Cardiovascular;  Laterality: N/A;  . Colonoscopy  2012    Negative screening procedure  . Esophageal manometry N/A 06/17/2013    Procedure: ESOPHAGEAL MANOMETRY (EM);  Surgeon: Sable Feil, MD;  Location: [facility] ENDOSCOPY;  Service: Endoscopy;  Laterality: N/A;  . Tee without cardioversion N/A 07/11/2013    Procedure: TRANSESOPHAGEAL ECHOCARDIOGRAM (TEE);  Surgeon: Thayer Headings, MD;  Location: [place];  Service: Cardiovascular;  Laterality: N/A;  . Aortic valve replacement N/A 07/18/2013    Procedure: AORTIC VALVE REPLACEMENT (AVR);  Surgeon: Gaye Pollack, MD;  Location: [place];  Service: Open Heart Surgery;  Laterality: N/A;  . Intraoperative transesophageal echocardiogram N/A 07/18/2013    Procedure: INTRAOPERATIVE TRANSESOPHAGEAL ECHOCARDIOGRAM;  Surgeon: Gaye Pollack, MD;  Location: [facility] OR;  Service: Open Heart Surgery;  Laterality: N/A;  . Mitral valve replacement N/A 07/18/2013    Procedure: MITRAL VALVE (MV) REPLACEMENT;  Surgeon: Gaye Pollack, MD;  Location: [place] OR;  Service: Open Heart Surgery;  Laterality: N/A;  . Tee without cardioversion N/A 07/26/2013    Procedure: TRANSESOPHAGEAL ECHOCARDIOGRAM (TEE);  Surgeon: Fay Records, MD;  Location: [place];  Service: Cardiovascular;  Laterality: N/A;  . Cardioversion N/A 07/26/2013    Procedure: CARDIOVERSION;  Surgeon: Fay Records, MD;  Location: [facility] ENDOSCOPY;  Service: Cardiovascular;  Laterality: N/A;  . Lumbar laminectomy/decompression microdiscectomy Right 05/13/2014    Procedure: LUMBAR LAMINECTOMY/DECOMPRESSION MICRODISCECTOMY 1 LEVEL  lumbar four/five;  Surgeon: Faythe Ghee, MD;  Location: [facility] NEURO ORS;  Service: Neurosurgery;  Laterality: Right;  . Back surgery    . Left and right heart catheterization with coronary angiogram N/A 07/10/2013    Procedure: LEFT AND RIGHT HEART CATHETERIZATION WITH CORONARY ANGIOGRAM;  Surgeon: Burnell Blanks, MD;  Location: [facility] CATH LAB;  Service: Cardiovascular;   Laterality: N/A;     Allergies  Allergen Reactions  . Amiodarone Rash      Family History  Problem Relation Age of Onset  . Heart disease Brother 11    MI  . Rheum arthritis Maternal Grandmother   . Asthma Maternal Grandfather   . Hypothyroidism Mother   . Diabetes Sister   . Cirrhosis Father   . Cervical cancer Sister     malignant neoplasm of cervix uteri     Social History [name] reports that she has never smoked. She has never used smokeless tobacco. [name] reports that she does not drink alcohol.   Review of Systems CONSTITUTIONAL: No weight loss, fever, chills, weakness or fatigue.  HEENT: Eyes: No visual loss, blurred vision, double vision  or yellow sclerae.No hearing loss, sneezing, congestion, runny nose or sore throat.  SKIN: No rash or itching.  CARDIOVASCULAR: per hpi RESPIRATORY: No shortness of breath, cough or sputum.  GASTROINTESTINAL: No anorexia, nausea, vomiting or diarrhea. No abdominal pain or blood.  GENITOURINARY: No burning on urination, no polyuria NEUROLOGICAL: No headache, dizziness, syncope, paralysis, ataxia, numbness or tingling in the extremities. No change in bowel or bladder control.  MUSCULOSKELETAL: No muscle, back pain, joint pain or stiffness.  LYMPHATICS: No enlarged nodes. No history of splenectomy.  PSYCHIATRIC: No history of depression or anxiety.  ENDOCRINOLOGIC: No reports of sweating, cold or heat intolerance. No polyuria or polydipsia.  Marland Kitchen   Physical Examination Filed Vitals:   11/09/15 0816  BP: 124/58  Pulse: 76   Filed Weights   11/09/15 0816  Weight: 169 lb (76.658 kg)    Gen: resting comfortably, no acute distress HEENT: no scleral icterus, pupils equal round and reactive, no palptable cervical adenopathy,  CV: RRR, 2/6 systolic murmur RUSB, no jvd Resp: Clear to auscultation bilaterally GI: abdomen is soft, non-tender, non-distended, normal bowel sounds, no hepatosplenomegaly MSK: extremities are  warm, no edema.  Skin: warm, no rash Neuro:  no focal deficits Psych: appropriate affect   Diagnostic Studies 07/2013 TTE Study Conclusions  - Study data: Technically difficult study. - Left ventricle: The cavity size was normal. Wall thickness was normal. Systolic function was normal. The estimated ejection fraction was in the range of 55% to 60%. Indeterminate diastolic function. There is evidence of very high left atrial pressures (E/e' 63) - Aortic valve: Trileaflet; mildly thickened leaflets. Accurate evaluation of aortic stenosis limited in the setting of severe mitral stenosis and decreased ventricular loadin. Morphologically there does not appear to be significant stenosis. By gradient (mean PG 21 mmHg) the stenosis is moderate, by AVA VTI (0.8) stenosis is severe. Moderate regurgitation, AR PHT is 439 cm/s. Valve area: 0.78cm^2(VTI). Valve area: 0.78cm^2 (Vmax). - Mitral valve: The findings are consistent with moderate to severe stenosis. (Mean PG 12 mmHg, MVA by PHT 1.3. Cannot use MVA by VTI due to moderate AI. Valve area by pressure half-time: 1.38cm^2. - Left atrium: The atrium was severely dilated. - Right ventricle: Systolic function was mildly reduced, RV TAPSE is 1.4, TV systolic annular tissue velocity 10 cm/s. - Pulmonary arteries: Systolic pressure was moderately increased. PA peak pressure: 52mm Hg (S) (PASP based on a fairly faint spectral Doppler waveform)   07/2013 TEE Study Conclusions  - Left ventricle: Systolic function was moderately reduced. The estimated ejection fraction was in the range of 35% to 40%. - Aortic valve: There was mild to moderate stenosis. Mild to moderate regurgitation. - Mitral valve: The findings are consistent with moderate to severe stenosis. Valve area by pressure half-time: 1.45cm^2. - Left atrium: There was spontaneous echo contrast ("smoke"). - Atrial septum: No defect  or patent foramen ovale was identified. - Tricuspid valve: No evidence of vegetation. Transesophageal echocardiography. 2D and color Doppler. Height: Height: 157.5cm. Height: 62in. Weight: Weight: 75kg. Weight: 165lb. Body mass index: BMI: 30.2kg/m^2. Body surface area:  BSA: 1.69m^2. Blood pressure: 143/56. Patient status: Inpatient. Location: Endoscopy.      11/09/15 Clinic EKG (performed and reviewed in clinic): NSR  Assessment and Plan  1. Valvular heart disease  - history of mitral and aortic stenosis, s/p tissue valve replacement of both valves  - she continues to do well more than 2 years out since her surgery, continue to monitor  2. Afib  - denies  any significant symptoms. EKG in clinic today shows normal sinus rhythm - hx of prior cardioembolic CVA - we will continue her coumadin  3. Hyperlipidemia - request labs from pcp - contiue current statin   F/u 6 months   Arnoldo Lenis, M.D.

## 2015-11-09 NOTE — Patient Instructions (Signed)
Medication Instructions:  Your physician recommends that you continue on your current medications as directed. Please refer to the Current Medication list given to you today.   Labwork: I WILL REQUEST LABS FROM PCP  Testing/Procedures: NONE  Follow-Up: Your physician wants you to follow-up in: 6 MONTHS .receive a reminder letter in the mail two months in advance. If you don't receive a letter, please call our office to schedule the follow-up appointment.\   Any Other Special Instructions Will Be Listed Below (If Applicable).     If you need a refill on your cardiac medications before your next appointment, please call your pharmacy.

## 2015-11-09 NOTE — Addendum Note (Signed)
Addended by: Malen Gauze on: 11/09/2015 08:16 AM   Modules accepted: Level of Service

## 2015-11-25 ENCOUNTER — Ambulatory Visit (INDEPENDENT_AMBULATORY_CARE_PROVIDER_SITE_OTHER): Payer: Medicare Other | Admitting: *Deleted

## 2015-11-25 DIAGNOSIS — Z7901 Long term (current) use of anticoagulants: Secondary | ICD-10-CM

## 2015-11-25 DIAGNOSIS — I4891 Unspecified atrial fibrillation: Secondary | ICD-10-CM | POA: Diagnosis not present

## 2015-11-25 DIAGNOSIS — Z5181 Encounter for therapeutic drug level monitoring: Secondary | ICD-10-CM

## 2015-11-25 DIAGNOSIS — I639 Cerebral infarction, unspecified: Secondary | ICD-10-CM

## 2015-11-25 LAB — POCT INR: INR: 3.7

## 2015-11-27 ENCOUNTER — Ambulatory Visit (HOSPITAL_COMMUNITY)
Admission: RE | Admit: 2015-11-27 | Discharge: 2015-11-27 | Disposition: A | Discharge status: Home / Self Care | Payer: Medicare Other | Source: Ambulatory Visit | Attending: Internal Medicine | Admitting: Internal Medicine

## 2015-11-27 DIAGNOSIS — Z1231 Encounter for screening mammogram for malignant neoplasm of breast: Secondary | ICD-10-CM | POA: Insufficient documentation

## 2015-12-14 ENCOUNTER — Ambulatory Visit (INDEPENDENT_AMBULATORY_CARE_PROVIDER_SITE_OTHER): Payer: Medicare Other | Admitting: *Deleted

## 2015-12-14 DIAGNOSIS — I639 Cerebral infarction, unspecified: Secondary | ICD-10-CM | POA: Diagnosis not present

## 2015-12-14 DIAGNOSIS — I4891 Unspecified atrial fibrillation: Secondary | ICD-10-CM

## 2015-12-14 DIAGNOSIS — Z7901 Long term (current) use of anticoagulants: Secondary | ICD-10-CM | POA: Diagnosis not present

## 2015-12-14 DIAGNOSIS — Z5181 Encounter for therapeutic drug level monitoring: Secondary | ICD-10-CM

## 2015-12-14 LAB — POCT INR: INR: 2.8

## 2016-01-11 ENCOUNTER — Ambulatory Visit (INDEPENDENT_AMBULATORY_CARE_PROVIDER_SITE_OTHER): Payer: Medicare Other | Admitting: *Deleted

## 2016-01-11 DIAGNOSIS — Z7901 Long term (current) use of anticoagulants: Secondary | ICD-10-CM

## 2016-01-11 DIAGNOSIS — I4891 Unspecified atrial fibrillation: Secondary | ICD-10-CM | POA: Diagnosis not present

## 2016-01-11 DIAGNOSIS — Z5181 Encounter for therapeutic drug level monitoring: Secondary | ICD-10-CM

## 2016-01-11 DIAGNOSIS — I639 Cerebral infarction, unspecified: Secondary | ICD-10-CM

## 2016-01-11 LAB — POCT INR: INR: 2.2

## 2016-02-01 ENCOUNTER — Ambulatory Visit (INDEPENDENT_AMBULATORY_CARE_PROVIDER_SITE_OTHER): Payer: Medicare Other | Admitting: *Deleted

## 2016-02-01 DIAGNOSIS — I639 Cerebral infarction, unspecified: Secondary | ICD-10-CM

## 2016-02-01 DIAGNOSIS — Z5181 Encounter for therapeutic drug level monitoring: Secondary | ICD-10-CM | POA: Diagnosis not present

## 2016-02-01 DIAGNOSIS — Z7901 Long term (current) use of anticoagulants: Secondary | ICD-10-CM | POA: Diagnosis not present

## 2016-02-01 DIAGNOSIS — I4891 Unspecified atrial fibrillation: Secondary | ICD-10-CM

## 2016-02-01 LAB — POCT INR: INR: 2.4

## 2016-02-05 ENCOUNTER — Telehealth: Payer: Self-pay

## 2016-02-05 NOTE — Telephone Encounter (Signed)
PHARMACIST WANTED TO VERIFY MEDICATION AND OK REFILLS

## 2016-02-15 ENCOUNTER — Ambulatory Visit (INDEPENDENT_AMBULATORY_CARE_PROVIDER_SITE_OTHER): Payer: Medicare Other | Admitting: *Deleted

## 2016-02-15 DIAGNOSIS — Z5181 Encounter for therapeutic drug level monitoring: Secondary | ICD-10-CM | POA: Diagnosis not present

## 2016-02-15 DIAGNOSIS — Z7901 Long term (current) use of anticoagulants: Secondary | ICD-10-CM | POA: Diagnosis not present

## 2016-02-15 DIAGNOSIS — I4891 Unspecified atrial fibrillation: Secondary | ICD-10-CM

## 2016-02-15 DIAGNOSIS — I639 Cerebral infarction, unspecified: Secondary | ICD-10-CM

## 2016-02-15 LAB — POCT INR: INR: 3.9

## 2016-02-19 ENCOUNTER — Encounter (INDEPENDENT_AMBULATORY_CARE_PROVIDER_SITE_OTHER): Payer: Self-pay

## 2016-03-04 ENCOUNTER — Ambulatory Visit (INDEPENDENT_AMBULATORY_CARE_PROVIDER_SITE_OTHER): Payer: Medicare Other | Admitting: *Deleted

## 2016-03-04 DIAGNOSIS — Z5181 Encounter for therapeutic drug level monitoring: Secondary | ICD-10-CM | POA: Diagnosis not present

## 2016-03-04 DIAGNOSIS — I4891 Unspecified atrial fibrillation: Secondary | ICD-10-CM

## 2016-03-04 DIAGNOSIS — Z7901 Long term (current) use of anticoagulants: Secondary | ICD-10-CM

## 2016-03-04 DIAGNOSIS — I639 Cerebral infarction, unspecified: Secondary | ICD-10-CM | POA: Diagnosis not present

## 2016-03-04 LAB — POCT INR: INR: 4

## 2016-03-21 ENCOUNTER — Ambulatory Visit (INDEPENDENT_AMBULATORY_CARE_PROVIDER_SITE_OTHER): Payer: Medicare Other | Admitting: *Deleted

## 2016-03-21 DIAGNOSIS — Z5181 Encounter for therapeutic drug level monitoring: Secondary | ICD-10-CM

## 2016-03-21 DIAGNOSIS — I639 Cerebral infarction, unspecified: Secondary | ICD-10-CM

## 2016-03-21 DIAGNOSIS — I4891 Unspecified atrial fibrillation: Secondary | ICD-10-CM | POA: Diagnosis not present

## 2016-03-21 DIAGNOSIS — Z7901 Long term (current) use of anticoagulants: Secondary | ICD-10-CM

## 2016-03-21 LAB — POCT INR: INR: 3.5

## 2016-03-22 ENCOUNTER — Telehealth: Payer: Self-pay | Admitting: *Deleted

## 2016-03-22 NOTE — Telephone Encounter (Signed)
Patient states that specimen this morning was negative. No mediation today.  tg

## 2016-04-04 ENCOUNTER — Ambulatory Visit (INDEPENDENT_AMBULATORY_CARE_PROVIDER_SITE_OTHER): Payer: Medicare Other | Admitting: *Deleted

## 2016-04-04 DIAGNOSIS — I639 Cerebral infarction, unspecified: Secondary | ICD-10-CM

## 2016-04-04 DIAGNOSIS — I4891 Unspecified atrial fibrillation: Secondary | ICD-10-CM | POA: Diagnosis not present

## 2016-04-04 DIAGNOSIS — Z5181 Encounter for therapeutic drug level monitoring: Secondary | ICD-10-CM | POA: Diagnosis not present

## 2016-04-04 DIAGNOSIS — Z7901 Long term (current) use of anticoagulants: Secondary | ICD-10-CM | POA: Diagnosis not present

## 2016-04-04 LAB — POCT INR: INR: 3.6

## 2016-04-11 ENCOUNTER — Telehealth: Payer: Self-pay | Admitting: *Deleted

## 2016-04-11 NOTE — Telephone Encounter (Signed)
Patient thinks she is going to have a tooth pulled. She has not scheduled the appointment because she wanted to find out what her pre-op instructions for Coumadin would be first.  I informed her the Lattie Haw is out of the office this week and a nurse from our [place] team would contact the patient on Lisa's behalf.

## 2016-04-11 NOTE — Telephone Encounter (Signed)
Returned call to pt, advised pt she can stay on her Coumadin for 1 tooth extraction.  Clot risk outweighs bleeding risks for 1 simple tooth extraction.  If surgical extraction needed and DDS states pt needs to hold her Coumadin we will need to get this approved by her cardiologist prior to procedure.  Pt verbalized understanding.

## 2016-04-12 ENCOUNTER — Telehealth: Payer: Self-pay | Admitting: *Deleted

## 2016-04-12 NOTE — Telephone Encounter (Signed)
Dentist calls wanted to inform us that pt will need to have 2 teeth surgically extracted asap. He wants her INR at 2.7 or less, he is aware of her range of 2.5-3.5. Thus appt scheduled for tomorrow and she will need an appt on 04/18/16.

## 2016-04-13 ENCOUNTER — Ambulatory Visit (INDEPENDENT_AMBULATORY_CARE_PROVIDER_SITE_OTHER): Payer: Medicare Other | Admitting: Pharmacist

## 2016-04-13 DIAGNOSIS — Z5181 Encounter for therapeutic drug level monitoring: Secondary | ICD-10-CM | POA: Diagnosis not present

## 2016-04-13 DIAGNOSIS — I639 Cerebral infarction, unspecified: Secondary | ICD-10-CM | POA: Diagnosis not present

## 2016-04-13 DIAGNOSIS — Z7901 Long term (current) use of anticoagulants: Secondary | ICD-10-CM

## 2016-04-13 DIAGNOSIS — I4891 Unspecified atrial fibrillation: Secondary | ICD-10-CM | POA: Diagnosis not present

## 2016-04-13 LAB — POCT INR: INR: 3.8

## 2016-04-18 ENCOUNTER — Ambulatory Visit (INDEPENDENT_AMBULATORY_CARE_PROVIDER_SITE_OTHER): Payer: Medicare Other | Admitting: *Deleted

## 2016-04-18 DIAGNOSIS — Z5181 Encounter for therapeutic drug level monitoring: Secondary | ICD-10-CM

## 2016-04-18 DIAGNOSIS — I4891 Unspecified atrial fibrillation: Secondary | ICD-10-CM | POA: Diagnosis not present

## 2016-04-18 DIAGNOSIS — I639 Cerebral infarction, unspecified: Secondary | ICD-10-CM

## 2016-04-18 DIAGNOSIS — Z7901 Long term (current) use of anticoagulants: Secondary | ICD-10-CM

## 2016-04-18 LAB — POCT INR: INR: 2.2

## 2016-04-25 ENCOUNTER — Ambulatory Visit (INDEPENDENT_AMBULATORY_CARE_PROVIDER_SITE_OTHER): Payer: Medicare Other | Admitting: *Deleted

## 2016-04-25 DIAGNOSIS — I482 Chronic atrial fibrillation, unspecified: Secondary | ICD-10-CM

## 2016-04-25 DIAGNOSIS — I4891 Unspecified atrial fibrillation: Secondary | ICD-10-CM | POA: Diagnosis not present

## 2016-04-25 DIAGNOSIS — Z5181 Encounter for therapeutic drug level monitoring: Secondary | ICD-10-CM

## 2016-04-25 DIAGNOSIS — Z7901 Long term (current) use of anticoagulants: Secondary | ICD-10-CM

## 2016-04-25 DIAGNOSIS — I639 Cerebral infarction, unspecified: Secondary | ICD-10-CM

## 2016-04-25 LAB — POCT INR: INR: 3.7

## 2016-05-09 ENCOUNTER — Ambulatory Visit (INDEPENDENT_AMBULATORY_CARE_PROVIDER_SITE_OTHER): Payer: Medicare Other | Admitting: *Deleted

## 2016-05-09 DIAGNOSIS — Z5181 Encounter for therapeutic drug level monitoring: Secondary | ICD-10-CM

## 2016-05-09 DIAGNOSIS — I639 Cerebral infarction, unspecified: Secondary | ICD-10-CM | POA: Diagnosis not present

## 2016-05-09 DIAGNOSIS — I4891 Unspecified atrial fibrillation: Secondary | ICD-10-CM

## 2016-05-09 DIAGNOSIS — Z7901 Long term (current) use of anticoagulants: Secondary | ICD-10-CM

## 2016-05-09 LAB — POCT INR: INR: 2.6

## 2016-05-10 ENCOUNTER — Ambulatory Visit (INDEPENDENT_AMBULATORY_CARE_PROVIDER_SITE_OTHER): Payer: Medicare Other | Admitting: Cardiology

## 2016-05-10 ENCOUNTER — Encounter: Payer: Self-pay | Admitting: Cardiology

## 2016-05-10 VITALS — BP 130/66 | HR 75 | Ht 62.5 in | Wt 169.0 lb

## 2016-05-10 DIAGNOSIS — I38 Endocarditis, valve unspecified: Secondary | ICD-10-CM

## 2016-05-10 DIAGNOSIS — E785 Hyperlipidemia, unspecified: Secondary | ICD-10-CM

## 2016-05-10 DIAGNOSIS — I4891 Unspecified atrial fibrillation: Secondary | ICD-10-CM

## 2016-05-10 NOTE — Patient Instructions (Signed)

## 2016-05-10 NOTE — Progress Notes (Signed)
Clinical Summary [name] is a 70 y.o.female seen today for follow up of the following medical problems.   1. Valvular heart disease  - 07/18/13 patient underwent MVR with 25 mm Edwards Magna-ease pericardial valve and also AVR with 21 mm Edwards Magna-ease pericardial valve    - walks a mile daily without troubles. No recent LE edema - compliant with meds. No bleeding troubles on coumadin.   2. Afib  - hx of cardioembolic CVA, has been on longterm anticoag - no recent palpitations   3. Hyperlipidemia - reports recent labs with her pcp - compliant with statin.    Past Medical History:  Diagnosis Date  . Anemia   . Atrial fibrillation ([place])   . Breast carcinoma ([place]) 1995   1995  . Cardioembolic stroke ([place]) [id number]   Right frontal in 01/2012; normal carotid ultrasound; possible LAA thrombus by TEE; virtual complete neurologic recovery  . Chronic kidney disease, stage 2, mildly decreased GFR    GFR of approximately 60  . Depression   . Diverticulosis of colon (without mention of hemorrhage) 2012   Dr. Laural Golden  . Fasting hyperglycemia    120 fasting  . Gastroesophageal reflux disease   . Gastroparesis   . Hemorrhoids   . Hyperlipidemia   . Hypertension    pt denies 05/30/13     Dr Kathe Mariner  . Hyponatremia   . Rheumatic heart disease    a. 07/2013 Echo: Ef 55-60%, Mod AS/AI, Mod-Sev MS, sev dil LA, PASP 58;  b. 07/2013 TEE: EF 35-40%, mild-mod AS/AI, mod-sev MS, LA smoke;  c. 07/2013 Cath: elev R heart pressures, nl cors.  . Shortness of breath      Allergies  Allergen Reactions  . Amiodarone Rash     Current Outpatient Prescriptions  Medication Sig Dispense Refill  . albuterol (VENTOLIN HFA) 108 (90 BASE) MCG/ACT inhaler Inhale 2 puffs into the lungs every 4 (four) hours as needed for wheezing or shortness of breath.     Marland Kitchen atorvastatin (LIPITOR) 20 MG tablet Take 20 mg by mouth every evening.     . diltiazem (CARDIZEM CD) 180 MG 24 hr  capsule Take 1 capsule (180 mg total) by mouth daily. 30 capsule 1  . escitalopram (LEXAPRO) 10 MG tablet Take 10 mg by mouth daily.     . fexofenadine (ALLEGRA) 30 MG tablet Take 30 mg by mouth 2 (two) times daily.    . fluticasone (FLONASE) 50 MCG/ACT nasal spray Place 1 spray into both nostrils daily as needed.     . furosemide (LASIX) 40 MG tablet Take 1 tablet (40 mg total) by mouth daily. *May take additional tablet at bedtime as needed for swelling 180 tablet 3  . levothyroxine (SYNTHROID, LEVOTHROID) 112 MCG tablet Take 1 tablet by mouth daily.    Marland Kitchen losartan (COZAAR) 50 MG tablet Take 50 mg by mouth daily.    . metFORMIN (GLUCOPHAGE) 500 MG tablet Take 1 tablet (500 mg total) by mouth 2 (two) times daily with a meal. 60 tablet 1  . metoprolol tartrate (LOPRESSOR) 25 MG tablet TAKE ONE TABLET BY MOUTH TWICE A DAY 180 tablet 3  . Multiple Vitamin (MULITIVITAMIN WITH MINERALS) TABS Take 1 tablet by mouth daily.    . potassium chloride SA (K-DUR,KLOR-CON) 20 MEQ tablet Take 20 mEq by mouth daily.    Marland Kitchen QVAR 80 MCG/ACT inhaler     . saccharomyces boulardii (FLORASTOR) 250 MG capsule Take 250 mg by mouth  daily.    . warfarin (COUMADIN) 2.5 MG tablet Take as directed by coumadin clinic 70 tablet 3  . warfarin (COUMADIN) 2.5 MG tablet Take 2 tablets daily 65 tablet 6   No current facility-administered medications for this visit.      Past Surgical History:  Procedure Laterality Date  . AORTIC VALVE REPLACEMENT N/A 07/18/2013   Procedure: AORTIC VALVE REPLACEMENT (AVR);  Surgeon: Gaye Pollack, MD;  Location: [place];  Service: Open Heart Surgery;  Laterality: N/A;  . BACK SURGERY    . BREAST LUMPECTOMY Right 1995  . CARDIAC CATHETERIZATION    . CARDIOVERSION N/A 07/26/2013   Procedure: CARDIOVERSION;  Surgeon: Fay Records, MD;  Location: [facility] ENDOSCOPY;  Service: Cardiovascular;  Laterality: N/A;  . COLONOSCOPY  2012   Negative screening procedure  . DILATION AND CURETTAGE OF UTERUS    .  ESOPHAGEAL MANOMETRY N/A 06/17/2013   Procedure: ESOPHAGEAL MANOMETRY (EM);  Surgeon: Sable Feil, MD;  Location: [facility] ENDOSCOPY;  Service: Endoscopy;  Laterality: N/A;  . INTRAOPERATIVE TRANSESOPHAGEAL ECHOCARDIOGRAM N/A 07/18/2013   Procedure: INTRAOPERATIVE TRANSESOPHAGEAL ECHOCARDIOGRAM;  Surgeon: Gaye Pollack, MD;  Location: [place] [facility];  Service: Open Heart Surgery;  Laterality: N/A;  . LEFT AND RIGHT HEART CATHETERIZATION WITH CORONARY ANGIOGRAM N/A 07/10/2013   Procedure: LEFT AND RIGHT HEART CATHETERIZATION WITH CORONARY ANGIOGRAM;  Surgeon: Burnell Blanks, MD;  Location: [facility] CATH LAB;  Service: Cardiovascular;  Laterality: N/A;  . LUMBAR LAMINECTOMY/DECOMPRESSION MICRODISCECTOMY Right 05/13/2014   Procedure: LUMBAR LAMINECTOMY/DECOMPRESSION MICRODISCECTOMY 1 LEVEL  lumbar four/five;  Surgeon: Faythe Ghee, MD;  Location: [facility] NEURO ORS;  Service: Neurosurgery;  Laterality: Right;  . MITRAL VALVE REPLACEMENT N/A 07/18/2013   Procedure: MITRAL VALVE (MV) REPLACEMENT;  Surgeon: Gaye Pollack, MD;  Location: [place];  Service: Open Heart Surgery;  Laterality: N/A;  . MITRAL VALVE [place], closed mitral valvulotomy by finger fracture  . TEE WITHOUT CARDIOVERSION  01/24/2012   Procedure: TRANSESOPHAGEAL ECHOCARDIOGRAM (TEE);  Surgeon: Lelon Perla, MD;  Location: [facility] ENDOSCOPY;  Service: Cardiovascular;  Laterality: N/A;  . TEE WITHOUT CARDIOVERSION N/A 07/11/2013   Procedure: TRANSESOPHAGEAL ECHOCARDIOGRAM (TEE);  Surgeon: Thayer Headings, MD;  Location: [place];  Service: Cardiovascular;  Laterality: N/A;  . TEE WITHOUT CARDIOVERSION N/A 07/26/2013   Procedure: TRANSESOPHAGEAL ECHOCARDIOGRAM (TEE);  Surgeon: Fay Records, MD;  Location: [place];  Service: Cardiovascular;  Laterality: N/A;  . TUBAL LIGATION  1973     Allergies  Allergen Reactions  . Amiodarone Rash      Family History  Problem Relation Age of Onset  . Heart disease Brother 12    MI    . Rheum arthritis Maternal Grandmother   . Asthma Maternal Grandfather   . Hypothyroidism Mother   . Diabetes Sister   . Cirrhosis Father   . Cervical cancer Sister     malignant neoplasm of cervix uteri     Social History Ms. Crecelius reports that she has never smoked. She has never used smokeless tobacco. Ms. Pari reports that she does not drink alcohol.   Review of Systems CONSTITUTIONAL: No weight loss, fever, chills, weakness or fatigue.  HEENT: Eyes: No visual loss, blurred vision, double vision or yellow sclerae.No hearing loss, sneezing, congestion, runny nose or sore throat.  SKIN: No rash or itching.  CARDIOVASCULAR: per HPI RESPIRATORY: No shortness of breath, cough or sputum.  GASTROINTESTINAL: No anorexia, nausea, vomiting or diarrhea. No abdominal pain or blood.  GENITOURINARY: No burning on urination, no polyuria NEUROLOGICAL: No headache, dizziness, syncope, paralysis, ataxia, numbness or tingling in the extremities. No change in bowel or bladder control.  MUSCULOSKELETAL: No muscle, back pain, joint pain or stiffness.  LYMPHATICS: No enlarged nodes. No history of splenectomy.  PSYCHIATRIC: No history of depression or anxiety.  ENDOCRINOLOGIC: No reports of sweating, cold or heat intolerance. No polyuria or polydipsia.  Marland Kitchen   Physical Examination Vitals:   05/10/16 0816  BP: 130/66  Pulse: 75   Vitals:   05/10/16 0816  Weight: 169 lb (76.7 kg)  Height: 5' 2.5" (1.588 m)    Gen: resting comfortably, no acute distress HEENT: no scleral icterus, pupils equal round and reactive, no palptable cervical adenopathy,  CV: RRR, 2/6 systolic murmur apex Pulm: CTAB GI: abdomen is soft, non-tender, non-distended, normal bowel sounds, no hepatosplenomegaly MSK: extremities are warm, no edema.  Skin: warm, no rash Neuro:  no focal deficits Psych: appropriate affect   Diagnostic Studies 07/2013 TTE Study Conclusions  - Study data: Technically difficult  study. - Left ventricle: The cavity size was normal. Wall thickness was normal. Systolic function was normal. The estimated ejection fraction was in the range of 55% to 60%. Indeterminate diastolic function. There is evidence of very high left atrial pressures (E/e' 63) - Aortic valve: Trileaflet; mildly thickened leaflets. Accurate evaluation of aortic stenosis limited in the setting of severe mitral stenosis and decreased ventricular loadin. Morphologically there does not appear to be significant stenosis. By gradient (mean PG 21 mmHg) the stenosis is moderate, by AVA VTI (0.8) stenosis is severe. Moderate regurgitation, AR PHT is 439 cm/s. Valve area: 0.78cm^2(VTI). Valve area: 0.78cm^2 (Vmax). - Mitral valve: The findings are consistent with moderate to severe stenosis. (Mean PG 12 mmHg, MVA by PHT 1.3. Cannot use MVA by VTI due to moderate AI. Valve area by pressure half-time: 1.38cm^2. - Left atrium: The atrium was severely dilated. - Right ventricle: Systolic function was mildly reduced, RV TAPSE is 1.4, TV systolic annular tissue velocity 10 cm/s. - Pulmonary arteries: Systolic pressure was moderately increased. PA peak pressure: 4mm Hg (S) (PASP based on a fairly faint spectral Doppler waveform)   07/2013 TEE Study Conclusions  - Left ventricle: Systolic function was moderately reduced. The estimated ejection fraction was in the range of 35% to 40%. - Aortic valve: There was mild to moderate stenosis. Mild to moderate regurgitation. - Mitral valve: The findings are consistent with moderate to severe stenosis. Valve area by pressure half-time: 1.45cm^2. - Left atrium: There was spontaneous echo contrast ("smoke"). - Atrial septum: No defect or patent foramen ovale was identified. - Tricuspid valve: No evidence of vegetation. Transesophageal echocardiography. 2D and color Doppler. Height: Height: 157.5cm. Height:  62in. Weight: Weight: 75kg. Weight: 165lb. Body mass index: BMI: 30.2kg/m^2. Body surface area:  BSA: 1.7m^2. Blood pressure: 143/56. Patient status: Inpatient. Location: Endoscopy.      11/09/15 Clinic EKG (performed and reviewed in clinic): NSR     Assessment and Plan   1. Valvular heart disease  - history of mitral and aortic stenosis, s/p tissue valve replacement of both valves  - she continues to do well - no med changes.   2. Afib  - denies any significant symptoms. - hx of prior cardioembolic CVA, has been continued on coumadin.  -CHADS2Vasc score is 3, continue anticoag.   3. Hyperlipidemia - request labs from pcp - we will contiue current statin   F/u 1 year     Khadeeja Elden F.  Harl Bowie, M.D., F.A.C.C.

## 2016-05-13 ENCOUNTER — Other Ambulatory Visit: Payer: Self-pay | Admitting: Cardiology

## 2016-05-30 ENCOUNTER — Ambulatory Visit (INDEPENDENT_AMBULATORY_CARE_PROVIDER_SITE_OTHER): Payer: Medicare Other | Admitting: *Deleted

## 2016-05-30 DIAGNOSIS — I4891 Unspecified atrial fibrillation: Secondary | ICD-10-CM

## 2016-05-30 DIAGNOSIS — Z5181 Encounter for therapeutic drug level monitoring: Secondary | ICD-10-CM | POA: Diagnosis not present

## 2016-05-30 DIAGNOSIS — Z7901 Long term (current) use of anticoagulants: Secondary | ICD-10-CM | POA: Diagnosis not present

## 2016-05-30 DIAGNOSIS — I639 Cerebral infarction, unspecified: Secondary | ICD-10-CM | POA: Diagnosis not present

## 2016-05-30 LAB — POCT INR: INR: 3.1

## 2016-06-27 ENCOUNTER — Ambulatory Visit (INDEPENDENT_AMBULATORY_CARE_PROVIDER_SITE_OTHER): Payer: Medicare Other | Admitting: *Deleted

## 2016-06-27 DIAGNOSIS — Z5181 Encounter for therapeutic drug level monitoring: Secondary | ICD-10-CM

## 2016-06-27 DIAGNOSIS — I639 Cerebral infarction, unspecified: Secondary | ICD-10-CM

## 2016-06-27 DIAGNOSIS — Z7901 Long term (current) use of anticoagulants: Secondary | ICD-10-CM

## 2016-06-27 DIAGNOSIS — I4891 Unspecified atrial fibrillation: Secondary | ICD-10-CM | POA: Diagnosis not present

## 2016-06-27 LAB — POCT INR: INR: 2.9

## 2016-07-25 ENCOUNTER — Ambulatory Visit (INDEPENDENT_AMBULATORY_CARE_PROVIDER_SITE_OTHER): Payer: Medicare Other | Admitting: *Deleted

## 2016-07-25 DIAGNOSIS — Z5181 Encounter for therapeutic drug level monitoring: Secondary | ICD-10-CM | POA: Diagnosis not present

## 2016-07-25 DIAGNOSIS — Z7901 Long term (current) use of anticoagulants: Secondary | ICD-10-CM | POA: Diagnosis not present

## 2016-07-25 DIAGNOSIS — I4891 Unspecified atrial fibrillation: Secondary | ICD-10-CM | POA: Diagnosis not present

## 2016-07-25 DIAGNOSIS — I639 Cerebral infarction, unspecified: Secondary | ICD-10-CM | POA: Diagnosis not present

## 2016-07-25 LAB — POCT INR: INR: 2.8

## 2016-08-22 ENCOUNTER — Ambulatory Visit (INDEPENDENT_AMBULATORY_CARE_PROVIDER_SITE_OTHER): Payer: Medicare Other | Admitting: *Deleted

## 2016-08-22 DIAGNOSIS — I4891 Unspecified atrial fibrillation: Secondary | ICD-10-CM | POA: Diagnosis not present

## 2016-08-22 DIAGNOSIS — Z7901 Long term (current) use of anticoagulants: Secondary | ICD-10-CM

## 2016-08-22 DIAGNOSIS — I639 Cerebral infarction, unspecified: Secondary | ICD-10-CM | POA: Diagnosis not present

## 2016-08-22 DIAGNOSIS — Z5181 Encounter for therapeutic drug level monitoring: Secondary | ICD-10-CM

## 2016-08-22 LAB — POCT INR: INR: 2.2

## 2016-08-24 ENCOUNTER — Other Ambulatory Visit: Payer: Self-pay | Admitting: Cardiology

## 2016-09-07 ENCOUNTER — Ambulatory Visit (INDEPENDENT_AMBULATORY_CARE_PROVIDER_SITE_OTHER): Payer: Medicare Other | Admitting: *Deleted

## 2016-09-07 DIAGNOSIS — I639 Cerebral infarction, unspecified: Secondary | ICD-10-CM

## 2016-09-07 DIAGNOSIS — I4891 Unspecified atrial fibrillation: Secondary | ICD-10-CM

## 2016-09-07 DIAGNOSIS — Z5181 Encounter for therapeutic drug level monitoring: Secondary | ICD-10-CM | POA: Diagnosis not present

## 2016-09-07 LAB — POCT INR: INR: 2.2

## 2016-09-21 ENCOUNTER — Ambulatory Visit (INDEPENDENT_AMBULATORY_CARE_PROVIDER_SITE_OTHER): Payer: Medicare Other | Admitting: *Deleted

## 2016-09-21 DIAGNOSIS — I639 Cerebral infarction, unspecified: Secondary | ICD-10-CM | POA: Diagnosis not present

## 2016-09-21 DIAGNOSIS — I4891 Unspecified atrial fibrillation: Secondary | ICD-10-CM | POA: Diagnosis not present

## 2016-09-21 DIAGNOSIS — Z5181 Encounter for therapeutic drug level monitoring: Secondary | ICD-10-CM

## 2016-09-21 LAB — POCT INR: INR: 2.4

## 2016-10-10 ENCOUNTER — Ambulatory Visit (INDEPENDENT_AMBULATORY_CARE_PROVIDER_SITE_OTHER): Payer: Medicare Other | Admitting: *Deleted

## 2016-10-10 DIAGNOSIS — I4891 Unspecified atrial fibrillation: Secondary | ICD-10-CM

## 2016-10-10 DIAGNOSIS — Z5181 Encounter for therapeutic drug level monitoring: Secondary | ICD-10-CM | POA: Diagnosis not present

## 2016-10-10 DIAGNOSIS — I639 Cerebral infarction, unspecified: Secondary | ICD-10-CM | POA: Diagnosis not present

## 2016-10-10 LAB — POCT INR: INR: 2

## 2016-10-18 ENCOUNTER — Other Ambulatory Visit (HOSPITAL_COMMUNITY): Payer: Self-pay | Admitting: Internal Medicine

## 2016-10-18 DIAGNOSIS — Z1231 Encounter for screening mammogram for malignant neoplasm of breast: Secondary | ICD-10-CM

## 2016-10-24 ENCOUNTER — Ambulatory Visit (INDEPENDENT_AMBULATORY_CARE_PROVIDER_SITE_OTHER): Payer: Medicare Other | Admitting: *Deleted

## 2016-10-24 DIAGNOSIS — Z5181 Encounter for therapeutic drug level monitoring: Secondary | ICD-10-CM

## 2016-10-24 DIAGNOSIS — I4891 Unspecified atrial fibrillation: Secondary | ICD-10-CM | POA: Diagnosis not present

## 2016-10-24 DIAGNOSIS — I639 Cerebral infarction, unspecified: Secondary | ICD-10-CM | POA: Diagnosis not present

## 2016-10-24 LAB — POCT INR: INR: 6.2

## 2016-10-31 ENCOUNTER — Ambulatory Visit (INDEPENDENT_AMBULATORY_CARE_PROVIDER_SITE_OTHER): Payer: Medicare Other | Admitting: *Deleted

## 2016-10-31 DIAGNOSIS — I639 Cerebral infarction, unspecified: Secondary | ICD-10-CM

## 2016-10-31 DIAGNOSIS — I4891 Unspecified atrial fibrillation: Secondary | ICD-10-CM | POA: Diagnosis not present

## 2016-10-31 DIAGNOSIS — Z5181 Encounter for therapeutic drug level monitoring: Secondary | ICD-10-CM | POA: Diagnosis not present

## 2016-10-31 LAB — POCT INR: INR: 3.3

## 2016-11-02 ENCOUNTER — Other Ambulatory Visit: Payer: Self-pay | Admitting: Cardiology

## 2016-11-09 ENCOUNTER — Other Ambulatory Visit: Payer: Self-pay | Admitting: Cardiology

## 2016-11-12 ENCOUNTER — Observation Stay (HOSPITAL_BASED_OUTPATIENT_CLINIC_OR_DEPARTMENT_OTHER): Payer: Medicare Other

## 2016-11-12 ENCOUNTER — Encounter (HOSPITAL_COMMUNITY): Payer: Self-pay | Admitting: Emergency Medicine

## 2016-11-12 ENCOUNTER — Observation Stay (HOSPITAL_COMMUNITY)
Admission: EM | Admit: 2016-11-12 | Discharge: 2016-11-14 | Disposition: A | Discharge status: Home / Self Care | Payer: Medicare Other | Attending: Internal Medicine | Admitting: Internal Medicine

## 2016-11-12 ENCOUNTER — Emergency Department (HOSPITAL_COMMUNITY): Payer: Medicare Other

## 2016-11-12 DIAGNOSIS — Z5181 Encounter for therapeutic drug level monitoring: Secondary | ICD-10-CM

## 2016-11-12 DIAGNOSIS — I10 Essential (primary) hypertension: Secondary | ICD-10-CM | POA: Diagnosis present

## 2016-11-12 DIAGNOSIS — M6281 Muscle weakness (generalized): Secondary | ICD-10-CM | POA: Diagnosis not present

## 2016-11-12 DIAGNOSIS — Z7984 Long term (current) use of oral hypoglycemic drugs: Secondary | ICD-10-CM | POA: Insufficient documentation

## 2016-11-12 DIAGNOSIS — J4521 Mild intermittent asthma with (acute) exacerbation: Secondary | ICD-10-CM | POA: Diagnosis not present

## 2016-11-12 DIAGNOSIS — I509 Heart failure, unspecified: Secondary | ICD-10-CM | POA: Diagnosis not present

## 2016-11-12 DIAGNOSIS — J45901 Unspecified asthma with (acute) exacerbation: Secondary | ICD-10-CM | POA: Diagnosis present

## 2016-11-12 DIAGNOSIS — I129 Hypertensive chronic kidney disease with stage 1 through stage 4 chronic kidney disease, or unspecified chronic kidney disease: Secondary | ICD-10-CM | POA: Insufficient documentation

## 2016-11-12 DIAGNOSIS — Z79899 Other long term (current) drug therapy: Secondary | ICD-10-CM | POA: Diagnosis not present

## 2016-11-12 DIAGNOSIS — J810 Acute pulmonary edema: Secondary | ICD-10-CM | POA: Diagnosis present

## 2016-11-12 DIAGNOSIS — N182 Chronic kidney disease, stage 2 (mild): Secondary | ICD-10-CM | POA: Insufficient documentation

## 2016-11-12 DIAGNOSIS — R29898 Other symptoms and signs involving the musculoskeletal system: Secondary | ICD-10-CM

## 2016-11-12 DIAGNOSIS — E039 Hypothyroidism, unspecified: Secondary | ICD-10-CM | POA: Diagnosis not present

## 2016-11-12 DIAGNOSIS — R42 Dizziness and giddiness: Secondary | ICD-10-CM | POA: Insufficient documentation

## 2016-11-12 DIAGNOSIS — R0600 Dyspnea, unspecified: Secondary | ICD-10-CM

## 2016-11-12 DIAGNOSIS — E785 Hyperlipidemia, unspecified: Secondary | ICD-10-CM | POA: Diagnosis present

## 2016-11-12 DIAGNOSIS — Z7901 Long term (current) use of anticoagulants: Secondary | ICD-10-CM | POA: Diagnosis not present

## 2016-11-12 DIAGNOSIS — I050 Rheumatic mitral stenosis: Secondary | ICD-10-CM | POA: Diagnosis present

## 2016-11-12 DIAGNOSIS — I4891 Unspecified atrial fibrillation: Secondary | ICD-10-CM | POA: Diagnosis not present

## 2016-11-12 DIAGNOSIS — R062 Wheezing: Secondary | ICD-10-CM | POA: Diagnosis present

## 2016-11-12 DIAGNOSIS — R2689 Other abnormalities of gait and mobility: Secondary | ICD-10-CM

## 2016-11-12 DIAGNOSIS — K219 Gastro-esophageal reflux disease without esophagitis: Secondary | ICD-10-CM | POA: Diagnosis present

## 2016-11-12 DIAGNOSIS — R609 Edema, unspecified: Secondary | ICD-10-CM | POA: Diagnosis present

## 2016-11-12 DIAGNOSIS — J45991 Cough variant asthma: Secondary | ICD-10-CM | POA: Diagnosis not present

## 2016-11-12 DIAGNOSIS — N390 Urinary tract infection, site not specified: Secondary | ICD-10-CM | POA: Insufficient documentation

## 2016-11-12 DIAGNOSIS — I350 Nonrheumatic aortic (valve) stenosis: Secondary | ICD-10-CM

## 2016-11-12 DIAGNOSIS — R0609 Other forms of dyspnea: Secondary | ICD-10-CM | POA: Diagnosis present

## 2016-11-12 DIAGNOSIS — E782 Mixed hyperlipidemia: Secondary | ICD-10-CM | POA: Diagnosis present

## 2016-11-12 LAB — COMPREHENSIVE METABOLIC PANEL
ALBUMIN: 3.5 g/dL (ref 3.5–5.0)
ALT: 20 U/L (ref 14–54)
AST: 22 U/L (ref 15–41)
Alkaline Phosphatase: 62 U/L (ref 38–126)
Anion gap: 11 (ref 5–15)
BUN: 12 mg/dL (ref 6–20)
CHLORIDE: 103 mmol/L (ref 101–111)
CO2: 24 mmol/L (ref 22–32)
Calcium: 8.7 mg/dL — ABNORMAL LOW (ref 8.9–10.3)
Creatinine, Ser: 0.89 mg/dL (ref 0.44–1.00)
GFR calc Af Amer: >60 mL/min (ref >60)
GFR calc non Af Amer: >60 mL/min (ref >60)
GLUCOSE: 150 mg/dL — AB (ref 65–99)
POTASSIUM: 3.1 mmol/L — AB (ref 3.5–5.1)
Sodium: 138 mmol/L (ref 135–145)
Total Bilirubin: 0.5 mg/dL (ref 0.3–1.2)
Total Protein: 6.5 g/dL (ref 6.5–8.1)

## 2016-11-12 LAB — DIFFERENTIAL
BASOS ABS: 0.1 10*3/uL (ref 0.0–0.1)
Basophils Relative: 1 %
EOS ABS: 0.5 10*3/uL (ref 0.0–0.7)
Eosinophils Relative: 6 %
LYMPHS ABS: 1.6 10*3/uL (ref 0.7–4.0)
Lymphocytes Relative: 21 %
Monocytes Absolute: 0.6 10*3/uL (ref 0.1–1.0)
Monocytes Relative: 8 %
NEUTROS ABS: 5.1 10*3/uL (ref 1.7–7.7)
Neutrophils Relative %: 64 %

## 2016-11-12 LAB — URINALYSIS, ROUTINE W REFLEX MICROSCOPIC
BILIRUBIN URINE: NEGATIVE
GLUCOSE, UA: NEGATIVE mg/dL
HGB URINE DIPSTICK: NEGATIVE
KETONES UR: NEGATIVE mg/dL
NITRITE: POSITIVE — AB
PH: 7 (ref 5.0–8.0)
Protein, ur: NEGATIVE mg/dL
Specific Gravity, Urine: 1.004 — ABNORMAL LOW (ref 1.005–1.030)

## 2016-11-12 LAB — BRAIN NATRIURETIC PEPTIDE: B Natriuretic Peptide: 103 pg/mL — ABNORMAL HIGH (ref 0.0–100.0)

## 2016-11-12 LAB — CBC
HCT: 35.9 % — ABNORMAL LOW (ref 36.0–46.0)
HEMOGLOBIN: 11.6 g/dL — AB (ref 12.0–15.0)
MCH: 28.7 pg (ref 26.0–34.0)
MCHC: 32.3 g/dL (ref 30.0–36.0)
MCV: 88.9 fL (ref 78.0–100.0)
Platelets: 220 10*3/uL (ref 150–400)
RBC: 4.04 MIL/uL (ref 3.87–5.11)
RDW: 14.4 % (ref 11.5–15.5)
WBC: 7.8 10*3/uL (ref 4.0–10.5)

## 2016-11-12 LAB — TROPONIN I
Troponin I: <0.03 ng/mL (ref <0.03)
Troponin I: <0.03 ng/mL (ref <0.03)

## 2016-11-12 LAB — RAPID URINE DRUG SCREEN, HOSP PERFORMED
AMPHETAMINES: NOT DETECTED
BARBITURATES: NOT DETECTED
Benzodiazepines: NOT DETECTED
Cocaine: NOT DETECTED
Opiates: NOT DETECTED
TETRAHYDROCANNABINOL: NOT DETECTED

## 2016-11-12 LAB — GLUCOSE, CAPILLARY
GLUCOSE-CAPILLARY: 374 mg/dL — AB (ref 65–99)
GLUCOSE-CAPILLARY: 377 mg/dL — AB (ref 65–99)
GLUCOSE-CAPILLARY: 225 mg/dL — AB (ref 65–99)
Glucose-Capillary: 395 mg/dL — ABNORMAL HIGH (ref 65–99)
Glucose-Capillary: 447 mg/dL — ABNORMAL HIGH (ref 65–99)
Glucose-Capillary: 302 mg/dL — ABNORMAL HIGH (ref 65–99)

## 2016-11-12 LAB — APTT: APTT: 51 s — AB (ref 24–36)

## 2016-11-12 LAB — PROTIME-INR
INR: 3.58
Prothrombin Time: 36.6 seconds — ABNORMAL HIGH (ref 11.4–15.2)

## 2016-11-12 LAB — ECHOCARDIOGRAM COMPLETE
HEIGHTINCHES: 62 in
Weight: 2848 oz

## 2016-11-12 MED ORDER — FUROSEMIDE 10 MG/ML IJ SOLN
60.0000 mg | Freq: Once | INTRAMUSCULAR | Status: AC
  Administered 2016-11-12: 60 mg via INTRAVENOUS

## 2016-11-12 MED ORDER — PREDNISONE 20 MG PO TABS
40.0000 mg | ORAL_TABLET | Freq: Every day | ORAL | Status: DC
  Administered 2016-11-13: 40 mg via ORAL
  Filled 2016-11-12: qty 2

## 2016-11-12 MED ORDER — DEXTROSE 5 % IV SOLN
1.0000 g | Freq: Once | INTRAVENOUS | Status: AC
  Administered 2016-11-12: 1 g via INTRAVENOUS
  Filled 2016-11-12: qty 10

## 2016-11-12 MED ORDER — FUROSEMIDE 10 MG/ML IJ SOLN: 40.0000 mg | Freq: Once | INTRAMUSCULAR | Status: DC

## 2016-11-12 MED ORDER — ATORVASTATIN CALCIUM 20 MG PO TABS
20.0000 mg | ORAL_TABLET | Freq: Every evening | ORAL | Status: DC
  Administered 2016-11-12 – 2016-11-13 (×2): 20 mg via ORAL
  Filled 2016-11-12 (×2): qty 1

## 2016-11-12 MED ORDER — DEXTROSE 5 % IV SOLN
1.0000 g | INTRAVENOUS | Status: DC
  Administered 2016-11-13 – 2016-11-14 (×2): 1 g via INTRAVENOUS
  Filled 2016-11-12 (×2): qty 10

## 2016-11-12 MED ORDER — ALBUTEROL SULFATE (2.5 MG/3ML) 0.083% IN NEBU
INHALATION_SOLUTION | RESPIRATORY_TRACT | Status: AC
  Administered 2016-11-12: 10 mg
  Filled 2016-11-12: qty 12

## 2016-11-12 MED ORDER — ALBUTEROL (5 MG/ML) CONTINUOUS INHALATION SOLN: 10.0000 mg/h | INHALATION_SOLUTION | Freq: Once | RESPIRATORY_TRACT | Status: DC

## 2016-11-12 MED ORDER — FUROSEMIDE 10 MG/ML IJ SOLN
60.0000 mg | Freq: Once | INTRAMUSCULAR | Status: DC
  Filled 2016-11-12: qty 6

## 2016-11-12 MED ORDER — IPRATROPIUM BROMIDE 0.02 % IN SOLN
0.5000 mg | Freq: Once | RESPIRATORY_TRACT | Status: AC
  Administered 2016-11-12: 0.5 mg via RESPIRATORY_TRACT
  Filled 2016-11-12: qty 2.5

## 2016-11-12 MED ORDER — ESCITALOPRAM OXALATE 10 MG PO TABS
10.0000 mg | ORAL_TABLET | Freq: Every day | ORAL | Status: DC
  Administered 2016-11-12 – 2016-11-13 (×2): 10 mg via ORAL
  Filled 2016-11-12 (×2): qty 1

## 2016-11-12 MED ORDER — ACETAMINOPHEN 325 MG PO TABS: 650.0000 mg | ORAL_TABLET | Freq: Four times a day (QID) | ORAL | Status: DC | PRN

## 2016-11-12 MED ORDER — WARFARIN SODIUM 7.5 MG PO TABS
3.7500 mg | ORAL_TABLET | Freq: Once | ORAL | Status: AC
  Administered 2016-11-12: 3.75 mg via ORAL
  Filled 2016-11-12: qty 1

## 2016-11-12 MED ORDER — FUROSEMIDE 10 MG/ML IJ SOLN
20.0000 mg | Freq: Every day | INTRAMUSCULAR | Status: DC
  Administered 2016-11-13: 20 mg via INTRAVENOUS
  Filled 2016-11-12: qty 2

## 2016-11-12 MED ORDER — POTASSIUM CHLORIDE CRYS ER 20 MEQ PO TBCR
40.0000 meq | EXTENDED_RELEASE_TABLET | Freq: Once | ORAL | Status: AC
  Administered 2016-11-12: 40 meq via ORAL
  Filled 2016-11-12: qty 2

## 2016-11-12 MED ORDER — ACETAMINOPHEN 650 MG RE SUPP: 650.0000 mg | Freq: Four times a day (QID) | RECTAL | Status: DC | PRN

## 2016-11-12 MED ORDER — IPRATROPIUM-ALBUTEROL 0.5-2.5 (3) MG/3ML IN SOLN
3.0000 mL | Freq: Four times a day (QID) | RESPIRATORY_TRACT | Status: DC
  Administered 2016-11-12 (×3): 3 mL via RESPIRATORY_TRACT
  Filled 2016-11-12 (×3): qty 3

## 2016-11-12 MED ORDER — ONDANSETRON HCL 4 MG/2ML IJ SOLN
4.0000 mg | Freq: Once | INTRAMUSCULAR | Status: AC
  Administered 2016-11-12: 4 mg via INTRAVENOUS
  Filled 2016-11-12: qty 2

## 2016-11-12 MED ORDER — GUAIFENESIN-CODEINE 100-10 MG/5ML PO SOLN
5.0000 mL | ORAL | Status: DC | PRN
  Administered 2016-11-12 – 2016-11-13 (×2): 5 mL via ORAL
  Filled 2016-11-12 (×2): qty 5

## 2016-11-12 MED ORDER — INSULIN ASPART 100 UNIT/ML ~~LOC~~ SOLN
0.0000 [IU] | Freq: Every day | SUBCUTANEOUS | Status: DC
  Administered 2016-11-12: 2 [IU] via SUBCUTANEOUS
  Administered 2016-11-13: 3 [IU] via SUBCUTANEOUS

## 2016-11-12 MED ORDER — DILTIAZEM HCL ER COATED BEADS 180 MG PO CP24
180.0000 mg | ORAL_CAPSULE | Freq: Every day | ORAL | Status: DC
  Administered 2016-11-12 – 2016-11-13 (×2): 180 mg via ORAL
  Filled 2016-11-12 (×2): qty 1

## 2016-11-12 MED ORDER — FLUTICASONE PROPIONATE 50 MCG/ACT NA SUSP: 1.0000 | Freq: Every day | NASAL | Status: DC | PRN

## 2016-11-12 MED ORDER — LOSARTAN POTASSIUM 50 MG PO TABS
50.0000 mg | ORAL_TABLET | Freq: Every day | ORAL | Status: DC
  Administered 2016-11-12 – 2016-11-13 (×2): 50 mg via ORAL
  Filled 2016-11-12 (×2): qty 1

## 2016-11-12 MED ORDER — METFORMIN HCL 500 MG PO TABS
500.0000 mg | ORAL_TABLET | Freq: Two times a day (BID) | ORAL | Status: DC
  Administered 2016-11-12 – 2016-11-13 (×4): 500 mg via ORAL
  Filled 2016-11-12 (×4): qty 1

## 2016-11-12 MED ORDER — GUAIFENESIN-DM 100-10 MG/5ML PO SYRP
5.0000 mL | ORAL_SOLUTION | ORAL | Status: DC | PRN
  Administered 2016-11-12: 5 mL via ORAL
  Filled 2016-11-12: qty 5

## 2016-11-12 MED ORDER — LEVOTHYROXINE SODIUM 112 MCG PO TABS
112.0000 ug | ORAL_TABLET | Freq: Every day | ORAL | Status: DC
  Administered 2016-11-12 – 2016-11-13 (×2): 112 ug via ORAL
  Filled 2016-11-12 (×2): qty 1

## 2016-11-12 MED ORDER — SENNOSIDES-DOCUSATE SODIUM 8.6-50 MG PO TABS: 1.0000 | ORAL_TABLET | Freq: Every evening | ORAL | Status: DC | PRN

## 2016-11-12 MED ORDER — METHYLPREDNISOLONE SODIUM SUCC 125 MG IJ SOLR
125.0000 mg | Freq: Once | INTRAMUSCULAR | Status: AC
  Administered 2016-11-12: 125 mg via INTRAVENOUS
  Filled 2016-11-12: qty 2

## 2016-11-12 MED ORDER — ALBUTEROL SULFATE (2.5 MG/3ML) 0.083% IN NEBU
5.0000 mg | INHALATION_SOLUTION | Freq: Once | RESPIRATORY_TRACT | Status: AC
  Administered 2016-11-12: 5 mg via RESPIRATORY_TRACT
  Filled 2016-11-12: qty 6

## 2016-11-12 MED ORDER — LORATADINE 10 MG PO TABS
10.0000 mg | ORAL_TABLET | Freq: Every day | ORAL | Status: DC
Start: 2016-11-12 — End: 2016-11-14
  Administered 2016-11-12 – 2016-11-13 (×2): 10 mg via ORAL
  Filled 2016-11-12 (×2): qty 1

## 2016-11-12 MED ORDER — DOCUSATE SODIUM 100 MG PO CAPS
100.0000 mg | ORAL_CAPSULE | Freq: Two times a day (BID) | ORAL | Status: DC
  Administered 2016-11-12 – 2016-11-13 (×4): 100 mg via ORAL
  Filled 2016-11-12 (×4): qty 1

## 2016-11-12 MED ORDER — INSULIN ASPART 100 UNIT/ML ~~LOC~~ SOLN
0.0000 [IU] | Freq: Three times a day (TID) | SUBCUTANEOUS | Status: DC
  Administered 2016-11-12: 7 [IU] via SUBCUTANEOUS
  Administered 2016-11-12: 9 [IU] via SUBCUTANEOUS
  Administered 2016-11-13 (×3): 5 [IU] via SUBCUTANEOUS

## 2016-11-12 MED ORDER — WARFARIN - PHARMACIST DOSING INPATIENT
Freq: Every day | Status: DC
  Administered 2016-11-12: 1
  Administered 2016-11-13: 16:00:00

## 2016-11-12 NOTE — ED Provider Notes (Addendum)
[facility] DEPT Provider Note   CSN: [id number] Arrival date & time: 11/12/16  0001   By signing my name below, I, Neta Mends, attest that this documentation has been prepared under the direction and in the presence of Rolland Porter, MD . Electronically Signed: Neta Mends, ED Scribe. 11/12/2016. 12:41 AM.  Pt seen 00:29AM  History   Chief Complaint Chief Complaint  Patient presents with  . Wheezing    The history is provided by the patient. No language interpreter was used.   HPI Comments:  [name] is a 71 y.o. female with PMHx of stroke who presents to the Emergency Department via EMS complaining of worsening wheezing x 1 week. Pt complains of associated persistent dry cough, chills, SOB, DOE, leg swelling today, general weakness, and right arm weakness that started about 10:30 PM. Pt's daughter states that the pt was complaining about being disoriented. Pt states that her right arm "did not feel right" and states that her strength is decreased with some tingling up to her wrist. Pt does not use NCO2 at home. Pt reports that she was playing golf earlier today and had to stop playing after 8 holes because she became weak. Pt takes Lasix every morning and at night as needed for swelling and her husband gave her one today, the last time was weeks ago. Per daughter, pt has hx of stroke that left her with mild right sided weakness. Pt had her mitral and aortic valves replaced. She is on coumadin. Pt is not a smoker, and states that she plays golf 3 days a week in good weather. Pt took 4 doses of albuterol PTA (including EMS) with mild relief. Pt denies fever, sore throat, rhinorrhea, chest pain Pt has albuterol and Qvar inhalers at home, no nebulizer. Pt states she "has a touch of asthma"  PCP: [name], MD Cardiologist: Harl Bowie MD  Past Medical History:  Diagnosis Date  . Anemia   . Atrial fibrillation ([place])   . Breast carcinoma ([place]) 1995   1995  . Cardioembolic  stroke ([place]) [id number]   Right frontal in 01/2012; normal carotid ultrasound; possible LAA thrombus by TEE; virtual complete neurologic recovery  . Chronic kidney disease, stage 2, mildly decreased GFR    GFR of approximately 60  . Depression   . Diverticulosis of colon (without mention of hemorrhage) 2012   Dr. Laural Golden  . Fasting hyperglycemia    120 fasting  . Gastroesophageal reflux disease   . Gastroparesis   . Hemorrhoids   . Hyperlipidemia   . Hypertension    pt denies 05/30/13     Dr Kathe Mariner  . Hyponatremia   . Rheumatic heart disease    a. 07/2013 Echo: Ef 55-60%, Mod AS/AI, Mod-Sev MS, sev dil LA, PASP 58;  b. 07/2013 TEE: EF 35-40%, mild-mod AS/AI, mod-sev MS, LA smoke;  c. 07/2013 Cath: elev R heart pressures, nl cors.  . Shortness of breath     Patient Active Problem List   Diagnosis Date Noted  . Asthma exacerbation 11/12/2016  . Synovial cyst of lumbar spine 05/13/2014  . Encounter for therapeutic drug monitoring 11/06/2013  . Heart valve replaced by other means 08/05/2013  . Hyponatremia   . Atrial fibrillation ([place])   . Moderate aortic stenosis 07/12/2013  . Moderate aortic insufficiency 07/12/2013  . Severe mitral valve stenosis 07/09/2013  . Mitral stenosis 07/08/2013  . Acute respiratory failure with hypoxia ([place]) 07/07/2013  . Acute pulmonary edema ([place]) 07/07/2013  .  Pleural effusion, bilateral 07/07/2013  . Chest pain 07/07/2013  . Asthma, cough variant 03/27/2013  . Chronic cough 09/17/2012  . Dyspnea on exertion 06/13/2012  . Edema 06/13/2012  . Rheumatic heart disease 06/13/2012  . Fasting hyperglycemia   . Chronic anticoagulation 01/26/2012  . Hypothyroidism 01/23/2012  . Cardioembolic stroke ([place]) [id number]  . Hyperlipidemia   . Hypertension   . Breast carcinoma ([place])   . Gastroesophageal reflux disease     Past Surgical History:  Procedure Laterality Date  . AORTIC VALVE REPLACEMENT N/A 07/18/2013   Procedure: AORTIC VALVE  REPLACEMENT (AVR);  Surgeon: Gaye Pollack, MD;  Location: [facility];  Service: Open Heart Surgery;  Laterality: N/A;  . BACK SURGERY    . BREAST LUMPECTOMY Right 1995  . CARDIAC CATHETERIZATION    . CARDIOVERSION N/A 07/26/2013   Procedure: CARDIOVERSION;  Surgeon: Fay Records, MD;  Location: [facility] ENDOSCOPY;  Service: Cardiovascular;  Laterality: N/A;  . COLONOSCOPY  2012   Negative screening procedure  . DILATION AND CURETTAGE OF UTERUS    . ESOPHAGEAL MANOMETRY N/A 06/17/2013   Procedure: ESOPHAGEAL MANOMETRY (EM);  Surgeon: Sable Feil, MD;  Location: [facility] ENDOSCOPY;  Service: Endoscopy;  Laterality: N/A;  . INTRAOPERATIVE TRANSESOPHAGEAL ECHOCARDIOGRAM N/A 07/18/2013   Procedure: INTRAOPERATIVE TRANSESOPHAGEAL ECHOCARDIOGRAM;  Surgeon: Gaye Pollack, MD;  Location: [facility] OR;  Service: Open Heart Surgery;  Laterality: N/A;  . LEFT AND RIGHT HEART CATHETERIZATION WITH CORONARY ANGIOGRAM N/A 07/10/2013   Procedure: LEFT AND RIGHT HEART CATHETERIZATION WITH CORONARY ANGIOGRAM;  Surgeon: Burnell Blanks, MD;  Location: [facility] CATH LAB;  Service: Cardiovascular;  Laterality: N/A;  . LUMBAR LAMINECTOMY/DECOMPRESSION MICRODISCECTOMY Right 05/13/2014   Procedure: LUMBAR LAMINECTOMY/DECOMPRESSION MICRODISCECTOMY 1 LEVEL  lumbar four/five;  Surgeon: Faythe Ghee, MD;  Location: [facility] NEURO ORS;  Service: Neurosurgery;  Laterality: Right;  . MITRAL VALVE REPLACEMENT N/A 07/18/2013   Procedure: MITRAL VALVE (MV) REPLACEMENT;  Surgeon: Gaye Pollack, MD;  Location: [facility] OR;  Service: Open Heart Surgery;  Laterality: N/A;  . MITRAL VALVE [place], closed mitral valvulotomy by finger fracture  . TEE WITHOUT CARDIOVERSION  01/24/2012   Procedure: TRANSESOPHAGEAL ECHOCARDIOGRAM (TEE);  Surgeon: Lelon Perla, MD;  Location: [facility] ENDOSCOPY;  Service: Cardiovascular;  Laterality: N/A;  . TEE WITHOUT CARDIOVERSION N/A 07/11/2013   Procedure: TRANSESOPHAGEAL ECHOCARDIOGRAM (TEE);  Surgeon: Thayer Headings, MD;  Location: [facility];  Service: Cardiovascular;  Laterality: N/A;  . TEE WITHOUT CARDIOVERSION N/A 07/26/2013   Procedure: TRANSESOPHAGEAL ECHOCARDIOGRAM (TEE);  Surgeon: Fay Records, MD;  Location: [facility] ENDOSCOPY;  Service: Cardiovascular;  Laterality: N/A;  . TUBAL LIGATION  1973    OB History    No data available       Home Medications    Prior to Admission medications   Medication Sig Start Date End Date Taking? Authorizing Provider  albuterol (VENTOLIN HFA) 108 (90 BASE) MCG/ACT inhaler Inhale 2 puffs into the lungs every 4 (four) hours as needed for wheezing or shortness of breath.     Historical Provider, MD  atorvastatin (LIPITOR) 20 MG tablet Take 20 mg by mouth every evening.     Historical Provider, MD  diltiazem (CARDIZEM CD) 180 MG 24 hr capsule Take 1 capsule (180 mg total) by mouth daily. 08/03/13   Arlyn Leak Collins, PA-C  escitalopram (LEXAPRO) 10 MG tablet Take 10 mg by mouth daily.  04/10/15   Historical Provider, MD  fexofenadine (ALLEGRA) 30 MG tablet Take 30  mg by mouth 2 (two) times daily.    Historical Provider, MD  fluticasone (FLONASE) 50 MCG/ACT nasal spray Place 1 spray into both nostrils daily as needed.  09/05/13   Arnoldo Lenis, MD  furosemide (LASIX) 40 MG tablet TAKE ONE TABLET BY MOUTH ONCE DAILY.MAY TAKE AN ADDITIONAL TABLET AT BEDTIME AS NEEDED FOR SWELLING. 11/09/16   Arnoldo Lenis, MD  levothyroxine (SYNTHROID, LEVOTHROID) 112 MCG tablet Take 1 tablet by mouth daily. 10/17/13   Historical Provider, MD  losartan (COZAAR) 50 MG tablet Take 50 mg by mouth daily.    Historical Provider, MD  metFORMIN (GLUCOPHAGE) 500 MG tablet Take 1 tablet (500 mg total) by mouth 2 (two) times daily with a meal. 08/03/13   Coolidge Breeze, PA-C  metoprolol tartrate (LOPRESSOR) 25 MG tablet TAKE (1) TABLET BY MOUTH TWICE DAILY. 11/02/16   Arnoldo Lenis, MD  Multiple Vitamin (MULITIVITAMIN WITH MINERALS) TABS Take 1 tablet by mouth daily.    Historical  Provider, MD  potassium chloride SA (K-DUR,KLOR-CON) 20 MEQ tablet Take 20 mEq by mouth daily. 08/14/13   Gaye Pollack, MD  QVAR 80 MCG/ACT inhaler  04/07/15   Historical Provider, MD  saccharomyces boulardii (FLORASTOR) 250 MG capsule Take 250 mg by mouth daily.    Historical Provider, MD  warfarin (COUMADIN) 2.5 MG tablet TAKE AS DIRECTED PER COUMADIN CLINIC. 08/24/16   Arnoldo Lenis, MD    Family History Family History  Problem Relation Age of Onset  . Heart disease Brother 4    MI  . Rheum arthritis Maternal Grandmother   . Asthma Maternal Grandfather   . Hypothyroidism Mother   . Diabetes Sister   . Cirrhosis Father   . Cervical cancer Sister     malignant neoplasm of cervix uteri    Social History Social History  Substance Use Topics  . Smoking status: Never Smoker  . Smokeless tobacco: Never Used  . Alcohol use No  lives with husband Lives at home   Allergies   Amiodarone   Review of Systems Review of Systems  Constitutional: Positive for chills. Negative for fever.  HENT: Negative for rhinorrhea and sore throat.   Respiratory: Positive for cough, shortness of breath and wheezing.   Cardiovascular: Positive for leg swelling. Negative for chest pain.  Neurological: Positive for weakness.  Psychiatric/Behavioral: Positive for confusion.  All other systems reviewed and are negative.    Physical Exam Updated Vital Signs BP 136/55 (BP Location: Left Arm)   Pulse 73   Temp 98.2 F (36.8 C) (Oral)   Resp 18   Ht 5' 2.5" (1.588 m)   Wt 172 lb (78 kg)   SpO2 100%   BMI 30.96 kg/m   Vital signs normal    Physical Exam  Constitutional: She is oriented to person, place, and time. She appears well-developed and well-nourished.  Non-toxic appearance. She does not appear ill. No distress.  HENT:  Head: Normocephalic and atraumatic.  Right Ear: External ear normal.  Left Ear: External ear normal.  Nose: Nose normal. No mucosal edema or rhinorrhea.    Mouth/Throat: Oropharynx is clear and moist and mucous membranes are normal. No dental abscesses or uvula swelling.  Eyes: Conjunctivae and EOM are normal. Pupils are equal, round, and reactive to light.  Neck: Normal range of motion and full passive range of motion without pain. Neck supple.  Cardiovascular: Normal rate, regular rhythm and normal heart sounds.  Exam reveals no gallop and no friction rub.  No murmur heard. Pulmonary/Chest: Effort normal and breath sounds normal. No respiratory distress. She has no wheezes. She has no rhonchi. She has no rales. She exhibits no tenderness and no crepitus.  Diffuse wheezing and rhonchi  Abdominal: Soft. Normal appearance and bowel sounds are normal. She exhibits no distension. There is no tenderness. There is no rebound and no guarding.  Musculoskeletal: Normal range of motion. She exhibits edema. She exhibits no tenderness.  Moves all extremities well. Nonpitting swelling of ankles  Neurological: She is alert and oriented to person, place, and time. She has normal strength. No cranial nerve deficit.  Grips are equal, no pronator drift, has some difficulty holding her LUE up against gravity compared to the right  Skin: Skin is warm, dry and intact. No rash noted. No erythema. No pallor.  Psychiatric: She has a normal mood and affect. Her speech is normal and behavior is normal. Her mood appears not anxious.  Nursing note and vitals reviewed.    ED Treatments / Results  Labs (all labs ordered are listed, but only abnormal results are displayed) Results for orders placed or performed during the hospital encounter of 11/12/16  Protime-INR  Result Value Ref Range   Prothrombin Time 36.6 (H) 11.4 - 15.2 seconds   INR 3.58   APTT  Result Value Ref Range   aPTT 51 (H) 24 - 36 seconds  CBC  Result Value Ref Range   WBC 7.8 4.0 - 10.5 K/uL   RBC 4.04 3.87 - 5.11 MIL/uL   Hemoglobin 11.6 (L) 12.0 - 15.0 g/dL   HCT 35.9 (L) 36.0 - 46.0 %    MCV 88.9 78.0 - 100.0 fL   MCH 28.7 26.0 - 34.0 pg   MCHC 32.3 30.0 - 36.0 g/dL   RDW 14.4 11.5 - 15.5 %   Platelets 220 150 - 400 K/uL  Differential  Result Value Ref Range   Neutrophils Relative % 64 %   Neutro Abs 5.1 1.7 - 7.7 K/uL   Lymphocytes Relative 21 %   Lymphs Abs 1.6 0.7 - 4.0 K/uL   Monocytes Relative 8 %   Monocytes Absolute 0.6 0.1 - 1.0 K/uL   Eosinophils Relative 6 %   Eosinophils Absolute 0.5 0.0 - 0.7 K/uL   Basophils Relative 1 %   Basophils Absolute 0.1 0.0 - 0.1 K/uL  Comprehensive metabolic panel  Result Value Ref Range   Sodium 138 135 - 145 mmol/L   Potassium 3.1 (L) 3.5 - 5.1 mmol/L   Chloride 103 101 - 111 mmol/L   CO2 24 22 - 32 mmol/L   Glucose, Bld 150 (H) 65 - 99 mg/dL   BUN 12 6 - 20 mg/dL   Creatinine, Ser 0.89 0.44 - 1.00 mg/dL   Calcium 8.7 (L) 8.9 - 10.3 mg/dL   Total Protein 6.5 6.5 - 8.1 g/dL   Albumin 3.5 3.5 - 5.0 g/dL   AST 22 15 - 41 U/L   ALT 20 14 - 54 U/L   Alkaline Phosphatase 62 38 - 126 U/L   Total Bilirubin 0.5 0.3 - 1.2 mg/dL   GFR calc non Af Amer >60 >60 mL/min   GFR calc Af Amer >60 >60 mL/min   Anion gap 11 5 - 15  Urine rapid drug screen (hosp performed)not at [facility]  Result Value Ref Range   Opiates NONE DETECTED NONE DETECTED   Cocaine NONE DETECTED NONE DETECTED   Benzodiazepines NONE DETECTED NONE DETECTED   Amphetamines NONE DETECTED NONE DETECTED  Tetrahydrocannabinol NONE DETECTED NONE DETECTED   Barbiturates NONE DETECTED NONE DETECTED  Urinalysis, Routine w reflex microscopic  Result Value Ref Range   Color, Urine YELLOW YELLOW   APPearance HAZY (A) CLEAR   Specific Gravity, Urine 1.004 (L) 1.005 - 1.030   pH 7.0 5.0 - 8.0   Glucose, UA NEGATIVE NEGATIVE mg/dL   Hgb urine dipstick NEGATIVE NEGATIVE   Bilirubin Urine NEGATIVE NEGATIVE   Ketones, ur NEGATIVE NEGATIVE mg/dL   Protein, ur NEGATIVE NEGATIVE mg/dL   Nitrite POSITIVE (A) NEGATIVE   Leukocytes, UA MODERATE (A) NEGATIVE   RBC / HPF 0-5 0 -  5 RBC/hpf   WBC, UA TOO NUMEROUS TO COUNT 0 - 5 WBC/hpf   Bacteria, UA RARE (A) NONE SEEN  Troponin I  Result Value Ref Range   Troponin I <0.03 <0.03 ng/mL  Brain natriuretic peptide  Result Value Ref Range   B Natriuretic Peptide 103.0 (H) 0.0 - 100.0 pg/mL   Laboratory interpretation all normal except UTI, hypokalemia, overtherapeutic INR    EKG  EKG Interpretation  Date/Time:  Saturday November 12 2016 00:56:37 EST Ventricular Rate:  70 PR Interval:    QRS Duration: 93 QT Interval:  432 QTC Calculation: 467 R Axis:   61 Text Interpretation:  Sinus rhythm Low voltage, precordial leads No significant change since last tracing 07 Jul 2014 Confirmed by Mena Simonis  MD-I, Dawanna Grauberger ([id number]) on 11/12/2016 7:38:30 AM       Radiology Dg Chest 2 View  Result Date: 11/12/2016 CLINICAL DATA:  Wheezing EXAM: CHEST  2 VIEW COMPARISON:  01/31/2014 FINDINGS: Postsurgical changes of mediastinum with valvular replacement x2. Surgical clips in the right axilla and over the right breast. There is mild cardiomegaly. There is mild central congestion and diffuse interstitial prominence suggesting mild edema. No focal consolidation. Stable pleural thickening along the fissure on lateral image. No pneumothorax. IMPRESSION: Cardiomegaly with mild central congestion and diffuse interstitial prominence suggestive of mild edema. Electronically Signed   By: Donavan Foil M.D.   On: 11/12/2016 00:33   Ct Head Wo Contrast  Result Date: 11/12/2016 CLINICAL DATA:  Wheezing dizziness EXAM: CT HEAD WITHOUT CONTRAST TECHNIQUE: Contiguous axial images were obtained from the base of the skull through the vertex without intravenous contrast. COMPARISON:  10/08/2014, MRI 07/07/2014 FINDINGS: Brain: No acute territorial infarction or hemorrhage. No focal mass, mass effect or midline shift. Old infarcts in the right frontal lobe and left cerebellum. Prominent cerebellar atrophy. Mild to moderate supratentorial atrophy. Ventricles  are stable in size. Mild to moderate periventricular white matter small vessel changes Vascular: No hyperdense vessels.  Carotid artery calcifications. Skull: No fracture.  Mastoid air cells are clear. Sinuses/Orbits: No acute finding. Other: None IMPRESSION: No CT evidence for acute intracranial abnormality. Other chronic changes as described above. Electronically Signed   By: Donavan Foil M.D.   On: 11/12/2016 01:58    Procedures Procedures (including critical care time)  CRITICAL CARE Performed by: Teagon Kron L Camari Quintanilla Total critical care time: 39 minutes Critical care time was exclusive of separately billable procedures and treating other patients. Critical care was necessary to treat or prevent imminent or life-threatening deterioration. Critical care was time spent personally by me on the following activities: development of treatment plan with patient and/or surrogate as well as nursing, discussions with consultants, evaluation of patient's response to treatment, examination of patient, obtaining history from patient or surrogate, ordering and performing treatments and interventions, ordering and review of laboratory studies, ordering and review of radiographic  studies, pulse oximetry and re-evaluation of patient's condition.   Medications Ordered in ED Medications  albuterol (PROVENTIL,VENTOLIN) solution continuous neb (10 mg/hr Nebulization Not Given 11/12/16 0135)  ipratropium (ATROVENT) nebulizer solution 0.5 mg (0.5 mg Nebulization Given 11/12/16 0141)  potassium chloride SA (K-DUR,KLOR-CON) CR tablet 40 mEq (40 mEq Oral Given 11/12/16 0113)  albuterol (PROVENTIL) (2.5 MG/3ML) 0.083% nebulizer solution (10 mg  Given 11/12/16 0140)  furosemide (LASIX) injection 60 mg (60 mg Intravenous Given 11/12/16 0113)  cefTRIAXone (ROCEPHIN) 1 g in dextrose 5 % 50 mL IVPB (0 g Intravenous Stopped 11/12/16 0344)  ondansetron (ZOFRAN) injection 4 mg (4 mg Intravenous Given 11/12/16 0238)  methylPREDNISolone  sodium succinate (SOLU-MEDROL) 125 mg/2 mL injection 125 mg (125 mg Intravenous Given 11/12/16 0352)  albuterol (PROVENTIL) (2.5 MG/3ML) 0.083% nebulizer solution 5 mg (5 mg Nebulization Given 11/12/16 0402)     Initial Impression / Assessment and Plan / ED Course  I have reviewed the triage vital signs and the nursing notes.  Pertinent labs & imaging results that were available during my care of the patient were reviewed by me and considered in my medical decision making (see chart for details).  DIAGNOSTIC STUDIES:  Oxygen Saturation is 100% on NCO2, normal by my interpretation.    COORDINATION OF CARE:  12:36 AM Chest xray ordered. Discussed treatment plan with pt at bedside and pt agreed to plan. Patient was given a continuous nebulizer 10 mg albuterol plus Atrovent, Lasix 60 mg IV and due to her low potassium oral potassium pill. Due to her complaints of weakness and numbness in her right upper extremity and prior history of stroke CT scan of the head was ordered. Telephone neurology consult was also ordered. Her symptoms started at 10:30 PM however I do not appreciate any weakness in her right upper extremity. She denies any difficulty walking because of her leg pain being weak.   Patient's continuous nebulizer was delayed due to her going to x-ray and also patient stating it causes her stomach to hurt but finally she did finish her nebulizer.  02:19 AM Dr Ronnald Ramp, teleneurologist, has seen patient. With her coumadin being therapeutic for artifical valves states to treat medically. Doubtful that she would have had another stroke. And if she did, her medications would not be changed.    2:30 AM I reviewed her laboratory results she is noted to have a UTI. Urine culture was ordered and she was started on IV Rocephin. On reexam she is still having diffuse wheezing and rhonchi. She states she's feeling a little better. She is having good urinary output from the IV Lasix. She was given  Solu-Medrol 125 mg IV and received a nebulizer with just albuterol 5 mg.  Recheck at 5:15 AM patient states she's feeling better. When I listen to her she is having rare had or rhonchi. I'm going have nursing staff ambulate patient.  5:45 AM staff reports patient ambulated and her pulse ox did not get below 93% however she became tachypnea can her heart rate went up. She also was noted to have more difficulty breathing and more wheezing when she returned to her room. He complained of being more short of breath.  I talked to the patient. She still wants to try to go home however it was felt she would benefit from being admitted. Husband states "I don't want to have her issues not addressed fully here and have to bring her back".   06:30 AM Dr Darrick Meigs, admit to tele, obs  Final Clinical Impressions(s) / ED Diagnoses   Final diagnoses:  Exacerbation of intermittent asthma, unspecified asthma severity  Urinary tract infection without hematuria, site unspecified  Weakness of right hand   Plan admission  Rolland Porter, MD, FACEP   I personally performed the services described in this documentation, which was scribed in my presence. The recorded information has been reviewed and considered.  Rolland Porter, MD, Barbette Or, MD 11/12/16 [place], MD 11/12/16 [phone]

## 2016-11-12 NOTE — ED Triage Notes (Signed)
Per pt having wheezing for "last several days" and today wheezing was worse.  Took 4 puffs of albuterol inhaler prior to EMS arrival, EMS gave 1 albuterol in route

## 2016-11-12 NOTE — H&P (Signed)
History and Physical    [name] [id number] DOB: 23-Aug-1946 DOA: 11/12/2016  PCP: [name], MD  Patient coming from: Home  Chief Complaint: Shortness of breath and wheezing x 1 week  HPI: [name] is a 71 y.o. female with medical history significant of HTN, aortic and mitral valve replacement, atrial fibrillation, HLD, hypothyroidism presents to the [facility] for shortness of breath and wheezing.  Patient reports also numbness and tingling of her arm.  Patient husband is bedside and states a week ago patient developed a more intense cough that was unproductive.  Patient began wheezing initially it was inaudible and then intensified to being that family members could hear it.  Patient has had wheezing before and takes Qvar and albuterol for wheezing.  Over the past week these symptoms intensified.  Yesterday afternoon patient was outside playing golf and patient "gave out" after 9 holes- developing shortness of breath, coughing and wheezing.  Patient did use albuterol at that time and did not receive much relief from it.  Over the last night patient had worsening shortness of breath and wheezing.  Patient husband noticed last night that patient ankles were twice the size they were supposed to be.  Patient has a prescription for lasix which she normally takes lasix in the am and then again in the pm if she needs it; she has not needed this since about 6 weeks ago.  The culmination of symptoms led family to call the ambulance.  Patient also developed tingling of the right hand as well.  Tingling has since resolved. Patient was able to speak in full sentences during these episodes.  No dizziness or lightheadedness, no chest pain or chest pressure, no fever or chills.  Slight unsteadiness on her feet.  Patient has a history of coumadin for which her INR is supposed to be between 2.5-3.5. No dysuria, but no increased frequency with slight increase in urgency.  Has had an urinary tract infection  before but last culture was in 2013 and showed E. Coli.  Last echocardiogram on file was in 2014 and showed an EF of 35-40%, mild to moderate stenosis and mild-moderate regurg of aortic valve, mod-severe stenosis of mitral valve (per family this was done prior to valve replacements).  ED Course: Seen by EDP who found slight pulmonary edema on CXR and elevated BNP of 103.    Review of Systems: As per HPI otherwise 10 point review of systems negative.    Past Medical History:  Diagnosis Date  . Anemia   . Atrial fibrillation ([place])   . Breast carcinoma ([place]) 1995   1995  . Cardioembolic stroke ([place]) [id number]   Right frontal in 01/2012; normal carotid ultrasound; possible LAA thrombus by TEE; virtual complete neurologic recovery  . Chronic kidney disease, stage 2, mildly decreased GFR    GFR of approximately 60  . Depression   . Diverticulosis of colon (without mention of hemorrhage) 2012   Dr. Laural Golden  . Fasting hyperglycemia    120 fasting  . Gastroesophageal reflux disease   . Gastroparesis   . Hemorrhoids   . Hyperlipidemia   . Hypertension    pt denies 05/30/13     Dr Kathe Mariner  . Hyponatremia   . Rheumatic heart disease    a. 07/2013 Echo: Ef 55-60%, Mod AS/AI, Mod-Sev MS, sev dil LA, PASP 58;  b. 07/2013 TEE: EF 35-40%, mild-mod AS/AI, mod-sev MS, LA smoke;  c. 07/2013 Cath: elev R heart pressures, nl cors.  Marland Kitchen  Shortness of breath     Past Surgical History:  Procedure Laterality Date  . AORTIC VALVE REPLACEMENT N/A 07/18/2013   Procedure: AORTIC VALVE REPLACEMENT (AVR);  Surgeon: Gaye Pollack, MD;  Location: [facility];  Service: Open Heart Surgery;  Laterality: N/A;  . BACK SURGERY    . BREAST LUMPECTOMY Right 1995  . CARDIAC CATHETERIZATION    . CARDIOVERSION N/A 07/26/2013   Procedure: CARDIOVERSION;  Surgeon: Fay Records, MD;  Location: [facility] ENDOSCOPY;  Service: Cardiovascular;  Laterality: N/A;  . COLONOSCOPY  2012   Negative screening procedure  . DILATION AND  CURETTAGE OF UTERUS    . ESOPHAGEAL MANOMETRY N/A 06/17/2013   Procedure: ESOPHAGEAL MANOMETRY (EM);  Surgeon: Sable Feil, MD;  Location: [facility] ENDOSCOPY;  Service: Endoscopy;  Laterality: N/A;  . INTRAOPERATIVE TRANSESOPHAGEAL ECHOCARDIOGRAM N/A 07/18/2013   Procedure: INTRAOPERATIVE TRANSESOPHAGEAL ECHOCARDIOGRAM;  Surgeon: Gaye Pollack, MD;  Location: [facility] OR;  Service: Open Heart Surgery;  Laterality: N/A;  . LEFT AND RIGHT HEART CATHETERIZATION WITH CORONARY ANGIOGRAM N/A 07/10/2013   Procedure: LEFT AND RIGHT HEART CATHETERIZATION WITH CORONARY ANGIOGRAM;  Surgeon: Burnell Blanks, MD;  Location: [facility] CATH LAB;  Service: Cardiovascular;  Laterality: N/A;  . LUMBAR LAMINECTOMY/DECOMPRESSION MICRODISCECTOMY Right 05/13/2014   Procedure: LUMBAR LAMINECTOMY/DECOMPRESSION MICRODISCECTOMY 1 LEVEL  lumbar four/five;  Surgeon: Faythe Ghee, MD;  Location: [facility] NEURO ORS;  Service: Neurosurgery;  Laterality: Right;  . MITRAL VALVE REPLACEMENT N/A 07/18/2013   Procedure: MITRAL VALVE (MV) REPLACEMENT;  Surgeon: Gaye Pollack, MD;  Location: [facility];  Service: Open Heart Surgery;  Laterality: N/A;  . MITRAL VALVE [place], closed mitral valvulotomy by finger fracture  . TEE WITHOUT CARDIOVERSION  01/24/2012   Procedure: TRANSESOPHAGEAL ECHOCARDIOGRAM (TEE);  Surgeon: Lelon Perla, MD;  Location: [facility] ENDOSCOPY;  Service: Cardiovascular;  Laterality: N/A;  . TEE WITHOUT CARDIOVERSION N/A 07/11/2013   Procedure: TRANSESOPHAGEAL ECHOCARDIOGRAM (TEE);  Surgeon: Thayer Headings, MD;  Location: [place];  Service: Cardiovascular;  Laterality: N/A;  . TEE WITHOUT CARDIOVERSION N/A 07/26/2013   Procedure: TRANSESOPHAGEAL ECHOCARDIOGRAM (TEE);  Surgeon: Fay Records, MD;  Location[facility] ENDOSCOPY;  Service: Cardiovascular;  Laterality: N/A;  . [place]     reports that she has never smoked. She has never used smokeless tobacco. She reports that she does not drink alcohol or  use drugs.  Allergies  Allergen Reactions  . Amiodarone Rash    Family History  Problem Relation Age of Onset  . Heart disease Brother 92    MI  . Rheum arthritis Maternal Grandmother   . Asthma Maternal Grandfather   . Hypothyroidism Mother   . Diabetes Sister   . Cirrhosis Father   . Cervical cancer Sister     malignant neoplasm of cervix uteri     Prior to Admission medications   Medication Sig Start Date End Date Taking? Authorizing Provider  albuterol (VENTOLIN HFA) 108 (90 BASE) MCG/ACT inhaler Inhale 2 puffs into the lungs every 4 (four) hours as needed for wheezing or shortness of breath.     Historical Provider, MD  atorvastatin (LIPITOR) 20 MG tablet Take 20 mg by mouth every evening.     Historical Provider, MD  diltiazem (CARDIZEM CD) 180 MG 24 hr capsule Take 1 capsule (180 mg total) by mouth daily. 08/03/13   Arlyn Leak Collins, PA-C  escitalopram (LEXAPRO) 10 MG tablet Take 10 mg by mouth daily.  04/10/15   Historical Provider,  MD  fexofenadine (ALLEGRA) 30 MG tablet Take 30 mg by mouth 2 (two) times daily.    Historical Provider, MD  fluticasone (FLONASE) 50 MCG/ACT nasal spray Place 1 spray into both nostrils daily as needed.  09/05/13   Arnoldo Lenis, MD  furosemide (LASIX) 40 MG tablet TAKE ONE TABLET BY MOUTH ONCE DAILY.MAY TAKE AN ADDITIONAL TABLET AT BEDTIME AS NEEDED FOR SWELLING. 11/09/16   Arnoldo Lenis, MD  levothyroxine (SYNTHROID, LEVOTHROID) 112 MCG tablet Take 1 tablet by mouth daily. 10/17/13   Historical Provider, MD  losartan (COZAAR) 50 MG tablet Take 50 mg by mouth daily.    Historical Provider, MD  metFORMIN (GLUCOPHAGE) 500 MG tablet Take 1 tablet (500 mg total) by mouth 2 (two) times daily with a meal. 08/03/13   Coolidge Breeze, PA-C  metoprolol tartrate (LOPRESSOR) 25 MG tablet TAKE (1) TABLET BY MOUTH TWICE DAILY. 11/02/16   Arnoldo Lenis, MD  Multiple Vitamin (MULITIVITAMIN WITH MINERALS) TABS Take 1 tablet by mouth daily.    Historical  Provider, MD  potassium chloride SA (K-DUR,KLOR-CON) 20 MEQ tablet Take 20 mEq by mouth daily. 08/14/13   Gaye Pollack, MD  QVAR 80 MCG/ACT inhaler  04/07/15   Historical Provider, MD  saccharomyces boulardii (FLORASTOR) 250 MG capsule Take 250 mg by mouth daily.    Historical Provider, MD  warfarin (COUMADIN) 2.5 MG tablet TAKE AS DIRECTED PER COUMADIN CLINIC. 08/24/16   Arnoldo Lenis, MD    Physical Exam: Vitals:   11/12/16 0530 11/12/16 0600 11/12/16 0730 11/12/16 0824  BP: 120/61 133/56 (!) 131/54 (!) 128/46  Pulse: 94 95 98 97  Resp: 20 16 21    Temp:    98.6 F (37 C)  TempSrc:    Temporal  SpO2: 93% 93% 96% 95%  Weight:    80.7 kg (178 lb)  Height:    5\' 2"  (1.575 m)      Constitutional: NAD, calm, comfortable Vitals:   11/12/16 0530 11/12/16 0600 11/12/16 0730 11/12/16 0824  BP: 120/61 133/56 (!) 131/54 (!) 128/46  Pulse: 94 95 98 97  Resp: 20 16 21    Temp:    98.6 F (37 C)  TempSrc:    Temporal  SpO2: 93% 93% 96% 95%  Weight:    80.7 kg (178 lb)  Height:    5\' 2"  (1.575 m)   Eyes: PERRL, lids and conjunctivae normal ENMT: Mucous membranes are moist. Posterior pharynx clear of any exudate or lesions.Normal dentition.  Neck: normal, supple, no masses, no thyromegaly Respiratory: bilateral expiratory wheezing, scattered rales  Cardiovascular: Regular rate and rhythm, no murmurs / rubs / gallops. No extremity edema. 2+ pedal pulses. No carotid bruits, no JVD  Abdomen: no tenderness, no masses palpated. No hepatosplenomegaly. Bowel sounds positive.  Musculoskeletal: no clubbing / cyanosis. No joint deformity upper and lower extremities. Good ROM, no contractures. Normal muscle tone.  Skin: no rashes, lesions, ulcers. No induration Neurologic: CN 2-12 grossly intact. Sensation intact, DTR normal. Strength 5/5 in all 4.  Psychiatric: Normal mood.  Family answering most questions asked, patient appears to have poor memory from [place] on Admission: I have  personally reviewed following labs and imaging studies  CBC:  Recent Labs Lab 11/12/16 0117  WBC 7.8  NEUTROABS 5.1  HGB 11.6*  HCT 35.9*  MCV 88.9  PLT [id number]   Basic Metabolic Panel:  Recent Labs Lab 11/12/16 0117  NA 138  K 3.1*  CL 103  CO2 24  GLUCOSE 150*  BUN 12  CREATININE 0.89  CALCIUM 8.7*   GFR: Estimated Creatinine Clearance: 56.2 mL/min (by C-G formula based on SCr of 0.89 mg/dL). Liver Function Tests:  Recent Labs Lab 11/12/16 0117  AST 22  ALT 20  ALKPHOS 62  BILITOT 0.5  PROT 6.5  ALBUMIN 3.5   No results for input(s): LIPASE, AMYLASE in the last 168 hours. No results for input(s): AMMONIA in the last 168 hours. Coagulation Profile:  Recent Labs Lab 11/12/16 0117  INR 3.58   Cardiac Enzymes:  Recent Labs Lab 11/12/16 0117  TROPONINI <0.03   BNP (last 3 results) No results for input(s): PROBNP in the last 8760 hours. HbA1C: No results for input(s): HGBA1C in the last 72 hours. CBG: No results for input(s): GLUCAP in the last 168 hours. Lipid Profile: No results for input(s): CHOL, HDL, LDLCALC, TRIG, CHOLHDL, LDLDIRECT in the last 72 hours. Thyroid Function Tests: No results for input(s): TSH, T4TOTAL, FREET4, T3FREE, THYROIDAB in the last 72 hours. Anemia Panel: No results for input(s): VITAMINB12, FOLATE, FERRITIN, TIBC, IRON, RETICCTPCT in the last 72 hours. Urine analysis:    Component Value Date/Time   COLORURINE YELLOW 11/12/2016 0043   APPEARANCEUR HAZY (A) 11/12/2016 0043   LABSPEC 1.004 (L) 11/12/2016 0043   PHURINE 7.0 11/12/2016 0043   GLUCOSEU NEGATIVE 11/12/2016 0043   HGBUR NEGATIVE 11/12/2016 0043   BILIRUBINUR NEGATIVE 11/12/2016 0043   KETONESUR NEGATIVE 11/12/2016 0043   PROTEINUR NEGATIVE 11/12/2016 0043   UROBILINOGEN 0.2 07/07/2014 1935   NITRITE POSITIVE (A) 11/12/2016 0043   LEUKOCYTESUR MODERATE (A) 11/12/2016 0043   Sepsis Labs:  !!!!!!!!!!!!!!!!!!!!!!!!!!!!!!!!!!!!!!!!!!!! @LABRCNTIP (procalcitonin:4,lacticidven:4) )No results found for this or any previous visit (from the past 240 hour(s)).   Radiological Exams on Admission: Dg Chest 2 View  Result Date: 11/12/2016 CLINICAL DATA:  Wheezing EXAM: CHEST  2 VIEW COMPARISON:  01/31/2014 FINDINGS: Postsurgical changes of mediastinum with valvular replacement x2. Surgical clips in the right axilla and over the right breast. There is mild cardiomegaly. There is mild central congestion and diffuse interstitial prominence suggesting mild edema. No focal consolidation. Stable pleural thickening along the fissure on lateral image. No pneumothorax. IMPRESSION: Cardiomegaly with mild central congestion and diffuse interstitial prominence suggestive of mild edema. Electronically Signed   By: Donavan Foil M.D.   On: 11/12/2016 00:33   Ct Head Wo Contrast  Result Date: 11/12/2016 CLINICAL DATA:  Wheezing dizziness EXAM: CT HEAD WITHOUT CONTRAST TECHNIQUE: Contiguous axial images were obtained from the base of the skull through the vertex without intravenous contrast. COMPARISON:  10/08/2014, MRI 07/07/2014 FINDINGS: Brain: No acute territorial infarction or hemorrhage. No focal mass, mass effect or midline shift. Old infarcts in the right frontal lobe and left cerebellum. Prominent cerebellar atrophy. Mild to moderate supratentorial atrophy. Ventricles are stable in size. Mild to moderate periventricular white matter small vessel changes Vascular: No hyperdense vessels.  Carotid artery calcifications. Skull: No fracture.  Mastoid air cells are clear. Sinuses/Orbits: No acute finding. Other: None IMPRESSION: No CT evidence for acute intracranial abnormality. Other chronic changes as described above. Electronically Signed   By: Donavan Foil M.D.   On: 11/12/2016 01:58    EKG: Independently reviewed. Low voltage sinus rhythm but essentially unchanged since 2015  Assessment/Plan Active  Problems:   Gastroesophageal reflux disease   Hyperlipidemia   Hypertension   Hypothyroidism   Chronic anticoagulation   Dyspnea on exertion   Edema   Asthma, cough variant   Acute pulmonary  edema (HCC)   Severe mitral valve stenosis   Moderate aortic stenosis   Atrial fibrillation (HCC)   Encounter for therapeutic drug monitoring   Asthma exacerbation   Acute CHF (congestive heart failure) (HCC)    Acute CHF - last EF of 35-40% in 2014 (prior to valve replacement) - will order echocardiogram for today - lasix 60mg  IV given in ED - will repeat 20mg  IV lasix this afternoon pending BP - edema essentially gone by time of admission per family - daily weights, I/O's strict - continue losartan - repeat BMP in am to evaluate kidney function and potassium  Asthma, cough variant - will give prednisone 40mg  daily - duonebs q6h - IS - oxygen therapy as needed - mucinex  HLD - continue statin  Hypothyroidism - continue synthroid  Atrial fibrillation - h/o - in sinus rhythmn now - continue diltiazem - holding beta blocker until out of acute decompensation - continue coumadin  H/o valve replacements - continue coumadin - echo ordered - monitor blood pressure     DVT prophylaxis:  coumadin Code Status:  Full Code Family Communication: Daughter and husband bedsides Disposition Plan: will likely discharge in 2-3 days Consults called: none Admission status:  Observation to telemetry   Loretha Stapler MD [facility] Pager [phone]  If 7PM-7AM, please contact night-coverage [email or web address] Password [facility]  11/12/2016, 8:48 AM

## 2016-11-12 NOTE — ED Notes (Signed)
Patient ambulated around nursing station. Patient has very weak gait and shaky. Patient's O2 ranged from 93-95 percent room air, respirations at 44 when walking, heart rate 109. Patient wheezing upon returning to room and states that she feels short of breath. Placed patient back in bed O2 is 95 room air.

## 2016-11-12 NOTE — Progress Notes (Signed)
Patient is having upper airway wheezes, Breath sounds appear diminished except for transmitted wheezes from above. It is doubtful if the upper airway wheezes will resolve with neb treatments.

## 2016-11-12 NOTE — Progress Notes (Signed)
ANTICOAGULATION CONSULT NOTE - Initial Consult  Pharmacy Consult for Coumadin Indication: mechanical valves  Allergies  Allergen Reactions  . Amiodarone Rash    Patient Measurements: Height: 5\' 2"  (157.5 cm) Weight: 178 lb (80.7 kg) IBW/kg (Calculated) : 50.1  Vital Signs: Temp: 98.6 F (37 C) (02/10 0824) Temp Source: Temporal (02/10 0824) BP: 128/46 (02/10 0824) Pulse Rate: 97 (02/10 0824)  Labs:  Recent Labs  11/12/16 0117 11/12/16 0833  HGB 11.6*  --   HCT 35.9*  --   PLT 220  --   APTT 51*  --   LABPROT 36.6*  --   INR 3.58  --   CREATININE 0.89  --   TROPONINI <0.03 <0.03    Estimated Creatinine Clearance: 56.2 mL/min (by C-G formula based on SCr of 0.89 mg/dL).   Medical History: Past Medical History:  Diagnosis Date  . Anemia   . Atrial fibrillation ([place])   . Breast carcinoma ([place]) 1995   1995  . Cardioembolic stroke ([place]) [id number]   Right frontal in 01/2012; normal carotid ultrasound; possible LAA thrombus by TEE; virtual complete neurologic recovery  . Chronic kidney disease, stage 2, mildly decreased GFR    GFR of approximately 60  . Depression   . Diverticulosis of colon (without mention of hemorrhage) 2012   Dr. Laural Golden  . Fasting hyperglycemia    120 fasting  . Gastroesophageal reflux disease   . Gastroparesis   . Hemorrhoids   . Hyperlipidemia   . Hypertension    pt denies 05/30/13     Dr Kathe Mariner  . Hyponatremia   . Rheumatic heart disease    a. 07/2013 Echo: Ef 55-60%, Mod AS/AI, Mod-Sev MS, sev dil LA, PASP 58;  b. 07/2013 TEE: EF 35-40%, mild-mod AS/AI, mod-sev MS, LA smoke;  c. 07/2013 Cath: elev R heart pressures, nl cors.  . Shortness of breath     Medications:  Prescriptions Prior to Admission  Medication Sig Dispense Refill Last Dose  . albuterol (VENTOLIN HFA) 108 (90 BASE) MCG/ACT inhaler Inhale 2 puffs into the lungs every 4 (four) hours as needed for wheezing or shortness of breath.    Taking  . atorvastatin  (LIPITOR) 20 MG tablet Take 20 mg by mouth every evening.    Taking  . diltiazem (CARDIZEM CD) 180 MG 24 hr capsule Take 1 capsule (180 mg total) by mouth daily. 30 capsule 1 Taking  . escitalopram (LEXAPRO) 10 MG tablet Take 10 mg by mouth daily.    Taking  . fexofenadine (ALLEGRA) 30 MG tablet Take 30 mg by mouth 2 (two) times daily.   Taking  . fluticasone (FLONASE) 50 MCG/ACT nasal spray Place 1 spray into both nostrils daily as needed.    Taking  . furosemide (LASIX) 40 MG tablet TAKE ONE TABLET BY MOUTH ONCE DAILY.MAY TAKE AN ADDITIONAL TABLET AT BEDTIME AS NEEDED FOR SWELLING. 90 tablet 3   . levothyroxine (SYNTHROID, LEVOTHROID) 112 MCG tablet Take 1 tablet by mouth daily.   Taking  . losartan (COZAAR) 50 MG tablet Take 50 mg by mouth daily.   Taking  . metFORMIN (GLUCOPHAGE) 500 MG tablet Take 1 tablet (500 mg total) by mouth 2 (two) times daily with a meal. 60 tablet 1 Taking  . metoprolol tartrate (LOPRESSOR) 25 MG tablet TAKE (1) TABLET BY MOUTH TWICE DAILY. 180 tablet 0   . Multiple Vitamin (MULITIVITAMIN WITH MINERALS) TABS Take 1 tablet by mouth daily.   Taking  . potassium chloride SA (  K-DUR,KLOR-CON) 20 MEQ tablet Take 20 mEq by mouth daily.   Taking  . QVAR 80 MCG/ACT inhaler    Taking  . saccharomyces boulardii (FLORASTOR) 250 MG capsule Take 250 mg by mouth daily.   Taking  . warfarin (COUMADIN) 2.5 MG tablet TAKE AS DIRECTED PER COUMADIN CLINIC. 50 tablet 6     Assessment: 71 yo female admitted for wheezing, generalized weakness, SOB, and disorientation. She is on chronic anticoagulation with Coumadin for mitral and aortic mechanical valves. Followed at anticoagulation clinic. INR today is 3.58. Home regimen is 5mg  on Tues, Thur, Sat and 3.75mg  on Mon, Wed, Fri, and Sunday. At upper end of range, will give only give 3.75mg  today.   Goal of Therapy:  INR 2.5-3.5 Monitor platelets by anticoagulation protocol: Yes   Plan:  Coumadin 3.75mg  today PT-INR daily Monitor for  s/s of bleeding  Isac Sarna, BS [place]ena [place], BCPS Clinical Pharmacist Pager [phone] 11/12/2016,10:17 AM

## 2016-11-12 NOTE — Progress Notes (Signed)
  Echocardiogram 2D Echocardiogram has been performed.  [name] 11/12/2016, 9:50 AM

## 2016-11-13 LAB — BASIC METABOLIC PANEL
ANION GAP: 8 (ref 5–15)
BUN: 24 mg/dL — AB (ref 6–20)
CALCIUM: 8.7 mg/dL — AB (ref 8.9–10.3)
CO2: 25 mmol/L (ref 22–32)
Chloride: 98 mmol/L — ABNORMAL LOW (ref 101–111)
Creatinine, Ser: 1.01 mg/dL — ABNORMAL HIGH (ref 0.44–1.00)
GFR calc Af Amer: >60 mL/min (ref >60)
GFR calc non Af Amer: 54 mL/min — ABNORMAL LOW (ref >60)
GLUCOSE: 249 mg/dL — AB (ref 65–99)
POTASSIUM: 4.5 mmol/L (ref 3.5–5.1)
Sodium: 131 mmol/L — ABNORMAL LOW (ref 135–145)

## 2016-11-13 LAB — CBC
HEMATOCRIT: 35.2 % — AB (ref 36.0–46.0)
HEMOGLOBIN: 11.4 g/dL — AB (ref 12.0–15.0)
MCH: 28.8 pg (ref 26.0–34.0)
MCHC: 32.4 g/dL (ref 30.0–36.0)
MCV: 88.9 fL (ref 78.0–100.0)
Platelets: 230 10*3/uL (ref 150–400)
RBC: 3.96 MIL/uL (ref 3.87–5.11)
RDW: 14.8 % (ref 11.5–15.5)
WBC: 22.2 10*3/uL — ABNORMAL HIGH (ref 4.0–10.5)

## 2016-11-13 LAB — GLUCOSE, CAPILLARY
GLUCOSE-CAPILLARY: 258 mg/dL — AB (ref 65–99)
GLUCOSE-CAPILLARY: 271 mg/dL — AB (ref 65–99)
GLUCOSE-CAPILLARY: 291 mg/dL — AB (ref 65–99)
Glucose-Capillary: 263 mg/dL — ABNORMAL HIGH (ref 65–99)

## 2016-11-13 LAB — TROPONIN I: Troponin I: <0.03 ng/mL (ref <0.03)

## 2016-11-13 LAB — PROTIME-INR
INR: 3.65
Prothrombin Time: 37.2 seconds — ABNORMAL HIGH (ref 11.4–15.2)

## 2016-11-13 MED ORDER — IPRATROPIUM-ALBUTEROL 0.5-2.5 (3) MG/3ML IN SOLN
3.0000 mL | Freq: Four times a day (QID) | RESPIRATORY_TRACT | Status: DC
  Administered 2016-11-13 (×2): 3 mL via RESPIRATORY_TRACT
  Filled 2016-11-13 (×4): qty 3

## 2016-11-13 NOTE — Progress Notes (Signed)
ANTICOAGULATION CONSULT NOTE -  Pharmacy Consult for Coumadin Indication: mechanical valves  Allergies  Allergen Reactions  . Amiodarone Rash    Patient Measurements: Height: 5' 2.5" (158.8 cm) Weight: 180 lb 11.2 oz (82 kg) IBW/kg (Calculated) : 51.25  Vital Signs: Temp: 98.1 F (36.7 C) (02/11 0514) Temp Source: Oral (02/11 0514) BP: 136/54 (02/11 0514) Pulse Rate: 88 (02/11 0514)  Labs:  Recent Labs  11/12/16 0117 11/12/16 [id number] 11/12/16 1456 11/13/16 0047 11/13/16 0643  HGB 11.6*  --   --   --  11.4*  HCT 35.9*  --   --   --  35.2*  PLT 220  --   --   --  230  APTT 51*  --   --   --   --   LABPROT 36.6*  --   --   --  37.2*  INR 3.58  --   --   --  3.65  CREATININE 0.89  --   --   --  1.01*  TROPONINI <0.03 <0.03 <0.03 <0.03  --     Estimated Creatinine Clearance: 50.6 mL/min (by C-G formula based on SCr of 1.01 mg/dL (H)).   Medical History: Past Medical History:  Diagnosis Date  . Anemia   . Atrial fibrillation ([place])   . Breast carcinoma ([place]) 1995   1995  . Cardioembolic stroke ([place]) [id number]   Right frontal in 01/2012; normal carotid ultrasound; possible LAA thrombus by TEE; virtual complete neurologic recovery  . Chronic kidney disease, stage 2, mildly decreased GFR    GFR of approximately 60  . Depression   . Diverticulosis of colon (without mention of hemorrhage) 2012   Dr. Laural Golden  . Fasting hyperglycemia    120 fasting  . Gastroesophageal reflux disease   . Gastroparesis   . Hemorrhoids   . Hyperlipidemia   . Hypertension    pt denies 05/30/13     Dr Kathe Mariner  . Hyponatremia   . Rheumatic heart disease    a. 07/2013 Echo: Ef 55-60%, Mod AS/AI, Mod-Sev MS, sev dil LA, PASP 58;  b. 07/2013 TEE: EF 35-40%, mild-mod AS/AI, mod-sev MS, LA smoke;  c. 07/2013 Cath: elev R heart pressures, nl cors.  . Shortness of breath     Medications:  Prescriptions Prior to Admission  Medication Sig Dispense Refill Last Dose  . albuterol  (VENTOLIN HFA) 108 (90 BASE) MCG/ACT inhaler Inhale 2 puffs into the lungs every 4 (four) hours as needed for wheezing or shortness of breath.    11/11/2016 at Unknown time  . atorvastatin (LIPITOR) 20 MG tablet Take 20 mg by mouth every evening.    11/11/2016 at Unknown time  . diltiazem (CARDIZEM CD) 180 MG 24 hr capsule Take 1 capsule (180 mg total) by mouth daily. 30 capsule 1 11/11/2016 at Unknown time  . escitalopram (LEXAPRO) 10 MG tablet Take 10 mg by mouth daily.    11/11/2016 at Unknown time  . fexofenadine (ALLEGRA) 30 MG tablet Take 30 mg by mouth 2 (two) times daily.   11/11/2016 at Unknown time  . fluticasone (FLONASE) 50 MCG/ACT nasal spray Place 1 spray into both nostrils daily as needed.    11/11/2016 at Unknown time  . furosemide (LASIX) 40 MG tablet TAKE ONE TABLET BY MOUTH ONCE DAILY.MAY TAKE AN ADDITIONAL TABLET AT BEDTIME AS NEEDED FOR SWELLING. 90 tablet 3 11/11/2016 at Unknown time  . levothyroxine (SYNTHROID, LEVOTHROID) 112 MCG tablet Take 1 tablet by mouth daily.  11/11/2016 at Unknown time  . losartan (COZAAR) 50 MG tablet Take 50 mg by mouth daily.   11/11/2016 at Unknown time  . metFORMIN (GLUCOPHAGE) 500 MG tablet Take 1 tablet (500 mg total) by mouth 2 (two) times daily with a meal. 60 tablet 1 11/11/2016 at Unknown time  . metoprolol tartrate (LOPRESSOR) 25 MG tablet TAKE (1) TABLET BY MOUTH TWICE DAILY. 180 tablet 0 11/11/2016 at 1700  . Multiple Vitamin (MULITIVITAMIN WITH MINERALS) TABS Take 1 tablet by mouth daily.   11/11/2016 at Unknown time  . potassium chloride SA (K-DUR,KLOR-CON) 20 MEQ tablet Take 20 mEq by mouth daily.   11/11/2016 at Unknown time  . QVAR 80 MCG/ACT inhaler    11/11/2016 at Unknown time  . saccharomyces boulardii (FLORASTOR) 250 MG capsule Take 250 mg by mouth daily.   11/11/2016 at Unknown time  . warfarin (COUMADIN) 2.5 MG tablet TAKE AS DIRECTED PER COUMADIN CLINIC. (Patient taking differently: 3.75mg  Sunday, Monday, Wednesday, Friday. Takes 5mg  Tuesday, Thursday,  Saturday.) 50 tablet 6 11/11/2016 at 1700    Assessment: 71 yo female admitted for wheezing, generalized weakness, SOB, and disorientation. She is on chronic anticoagulation with Coumadin for mitral and aortic mechanical valves. Followed at anticoagulation clinic. INR today is 3.58. Home regimen is 5mg  on Tues, Thur, Sat and 3.75mg  on Mon, Wed, Fri, and Sunday. INR slightly supra-therapeutic at 3.6.  Goal of Therapy:  INR 2.5-3.5 Monitor platelets by anticoagulation protocol: Yes   Plan:  No Coumadin today PT-INR daily Monitor for s/s of bleeding  Isac Sarna, BS [place]ena [place], BCPS Clinical Pharmacist Pager [phone] 11/13/2016,10:13 AM

## 2016-11-13 NOTE — Evaluation (Signed)
Physical Therapy Evaluation Patient Details Name: [name] MRN: [id number] DOB: 05-20-1946 Today's Date: 11/13/2016   History of Present Illness  71 yo F admitted after presenting with shortness of breath, wheezing and cough. Chest x-ray revealed interstitial prominence possibly consistent with mild CHF, and possible UTI.   Clinical Impression  Pt received in bed, husband [name] present, and pt is agreeable to PT evaluation.  Pt is normally an unlimited community ambulator who walks the dog ~1 mile every day, and plays golf roughly 3x's/wk.  She is independent with all dressing and bathing.  During PT evaluation, she ambulated 463ft independently, with SpO2 of 91%, and HR: 107bpm at the end.  She currently does not demonstrate need for skilled PT at this time, and encouraged pt to get back to her normal activities gradually once she is home.  PT will sign off at this time.     Follow Up Recommendations No PT follow up    Equipment Recommendations  None recommended by PT    Recommendations for Other Services       Precautions / Restrictions Precautions Precautions: None Restrictions Weight Bearing Restrictions: No      Mobility  Bed Mobility Overal bed mobility: Modified Independent                Transfers Overall transfer level: Modified independent                  Ambulation/Gait Ambulation/Gait assistance: Independent Ambulation Distance (Feet): 400 Feet Assistive device: None Gait Pattern/deviations: WFL(Within Functional Limits)   Gait velocity interpretation: at or above normal speed for age/gender    Stairs            Wheelchair Mobility    Modified Rankin (Stroke Patients Only)       Balance Overall balance assessment: Independent;No apparent balance deficits (not formally assessed)                                           Pertinent Vitals/Pain Pain Assessment: No/denies pain    Home Living   Living  Arrangements: Spouse/significant other Available Help at Discharge: Available 24 hours/day Type of Home: House Home Access: Stairs to enter   Entrance Stairs-Number of Steps: 4 Home Layout: Two level;Able to live on main level with bedroom/bathroom Home Equipment: None      Prior Function     Gait / Transfers Assistance Needed: independent.   ADL's / Homemaking Assistance Needed: independent.   Comments: Limited driving.  Husband does most of the driving, pt will go with him.     Hand Dominance   Dominant Hand: Right    Extremity/Trunk Assessment   Upper Extremity Assessment Upper Extremity Assessment: Overall WFL for tasks assessed    Lower Extremity Assessment Lower Extremity Assessment: Overall WFL for tasks assessed       Communication   Communication: No difficulties  Cognition Arousal/Alertness: Awake/alert Behavior During Therapy: WFL for tasks assessed/performed Overall Cognitive Status: Within Functional Limits for tasks assessed                      General Comments      Exercises     Assessment/Plan    PT Assessment Patent does not need any further PT services  PT Problem List            PT Treatment Interventions  PT Goals (Current goals can be found in the Care Plan section)  Acute Rehab PT Goals PT Goal Formulation: All assessment and education complete, DC therapy    Frequency     Barriers to discharge        Co-evaluation               End of Session Equipment Utilized During Treatment: Gait belt Activity Tolerance: Patient tolerated treatment well Patient left: in chair;with call bell/phone within reach;with family/visitor present      Functional Assessment Tool Used: [place] "6-clicks"  Functional Limitation: Mobility: Walking and moving around Mobility: Walking and Moving Around Current Status [phone]): 0 percent impaired, limited or restricted Mobility: Walking and Moving Around Goal  Status [phone]): 0 percent impaired, limited or restricted Mobility: Walking and Moving Around Discharge Status [phone]): 0 percent impaired, limited or restricted    Time: 1131-1145 PT Time Calculation (min) (ACUTE ONLY): 14 min   Charges:   PT Evaluation $PT Eval Low Complexity: 1 Procedure     PT G Codes:   PT G-Codes **NOT FOR INPATIENT CLASS** Functional Assessment Tool Used: [employer] "6-clicks"  Functional Limitation: Mobility: Walking and moving around Mobility: Walking and Moving Around Current Status [phone]): 0 percent impaired, limited or restricted Mobility: Walking and Moving Around Goal Status [phone]): 0 percent impaired, limited or restricted Mobility: Walking and Moving Around Discharge Status [phone]): 0 percent impaired, limited or restricted   Beth Ariani Seier, PT, DPT X: [id number]

## 2016-11-13 NOTE — Progress Notes (Signed)
Subjective: [name] was admitted yesterday after presenting with shortness of breath, wheezing and cough. Chest x-ray revealed interstitial prominence possibly consistent with mild CHF. She denies fever. She has had minimal sputum production.  Objective: Vital signs in last 24 hours: Vitals:   11/12/16 2017 11/12/16 2145 11/13/16 0514 11/13/16 0808  BP:  (!) 114/47 (!) 136/54   Pulse:  89 88   Resp:  20 18   Temp:  98.5 F (36.9 C) 98.1 F (36.7 C)   TempSrc:  Oral Oral   SpO2: 96% 96% 96% 98%  Weight:   180 lb 11.2 oz (82 kg)   Height:   5' 2.5" (1.588 m)    Weight change: 6 lb (2.722 kg)  Intake/Output Summary (Last 24 hours) at 11/13/16 [id number] Last data filed at 11/12/16 1730  Gross per 24 hour  Intake              720 ml  Output                0 ml  Net              720 ml    Physical Exam: Alert. Breathing much better today she states. Lungs clear with no wheezing. Heart regular at 80 bpm. Extremities reveal 1+ edema at the ankles. Neuro at baseline.  Lab Results:    Results for orders placed or performed during the hospital encounter of 11/12/16 (from the past 24 hour(s))  Glucose, capillary     Status: Abnormal   Collection Time: 11/12/16 11:34 AM  Result Value Ref Range   Glucose-Capillary 374 (H) 65 - 99 mg/dL   Comment 1 Notify RN   Glucose, capillary     Status: Abnormal   Collection Time: 11/12/16 11:55 AM  Result Value Ref Range   Glucose-Capillary 395 (H) 65 - 99 mg/dL  Glucose, capillary     Status: Abnormal   Collection Time: 11/12/16  1:03 PM  Result Value Ref Range   Glucose-Capillary 447 (H) 65 - 99 mg/dL  Glucose, capillary     Status: Abnormal   Collection Time: 11/12/16  2:29 PM  Result Value Ref Range   Glucose-Capillary 377 (H) 65 - 99 mg/dL   Comment 1 Notify RN   Troponin I (q 6hr x 3)     Status: None   Collection Time: 11/12/16  2:56 PM  Result Value Ref Range   Troponin I <0.03 <0.03 ng/mL  Glucose, capillary     Status: Abnormal    Collection Time: 11/12/16  4:03 PM  Result Value Ref Range   Glucose-Capillary 302 (H) 65 - 99 mg/dL   Comment 1 Notify RN    Comment 2 Document in Chart   Glucose, capillary     Status: Abnormal   Collection Time: 11/12/16  9:04 PM  Result Value Ref Range   Glucose-Capillary 225 (H) 65 - 99 mg/dL   Comment 1 Notify RN    Comment 2 Document in Chart   Troponin I (q 6hr x 3)     Status: None   Collection Time: 11/13/16 12:47 AM  Result Value Ref Range   Troponin I <0.03 <0.03 ng/mL  Basic metabolic panel     Status: Abnormal   Collection Time: 11/13/16  6:43 AM  Result Value Ref Range   Sodium 131 (L) 135 - 145 mmol/L   Potassium 4.5 3.5 - 5.1 mmol/L   Chloride 98 (L) 101 - 111 mmol/L   CO2 25 22 -  32 mmol/L   Glucose, Bld 249 (H) 65 - 99 mg/dL   BUN 24 (H) 6 - 20 mg/dL   Creatinine, Ser 1.01 (H) 0.44 - 1.00 mg/dL   Calcium 8.7 (L) 8.9 - 10.3 mg/dL   GFR calc non Af Amer 54 (L) >60 mL/min   GFR calc Af Amer >60 >60 mL/min   Anion gap 8 5 - 15  CBC     Status: Abnormal   Collection Time: 11/13/16  6:43 AM  Result Value Ref Range   WBC 22.2 (H) 4.0 - 10.5 K/uL   RBC 3.96 3.87 - 5.11 MIL/uL   Hemoglobin 11.4 (L) 12.0 - 15.0 g/dL   HCT 35.2 (L) 36.0 - 46.0 %   MCV 88.9 78.0 - 100.0 fL   MCH 28.8 26.0 - 34.0 pg   MCHC 32.4 30.0 - 36.0 g/dL   RDW 14.8 11.5 - 15.5 %   Platelets 230 150 - 400 K/uL  Protime-INR     Status: Abnormal   Collection Time: 11/13/16  6:43 AM  Result Value Ref Range   Prothrombin Time 37.2 (H) 11.4 - 15.2 seconds   INR 3.65   Glucose, capillary     Status: Abnormal   Collection Time: 11/13/16  7:30 AM  Result Value Ref Range   Glucose-Capillary 263 (H) 65 - 99 mg/dL   Comment 1 Notify RN      ABGS No results for input(s): PHART, PO2ART, TCO2, HCO3 in the last 72 hours.  Invalid input(s): PCO2 CULTURES No results found for this or any previous visit (from the past 240 hour(s)). Studies/Results: Dg Chest 2 View  Result Date:  11/12/2016 CLINICAL DATA:  Wheezing EXAM: CHEST  2 VIEW COMPARISON:  01/31/2014 FINDINGS: Postsurgical changes of mediastinum with valvular replacement x2. Surgical clips in the right axilla and over the right breast. There is mild cardiomegaly. There is mild central congestion and diffuse interstitial prominence suggesting mild edema. No focal consolidation. Stable pleural thickening along the fissure on lateral image. No pneumothorax. IMPRESSION: Cardiomegaly with mild central congestion and diffuse interstitial prominence suggestive of mild edema. Electronically Signed   By: Donavan Foil M.D.   On: 11/12/2016 00:33   Ct Head Wo Contrast  Result Date: 11/12/2016 CLINICAL DATA:  Wheezing dizziness EXAM: CT HEAD WITHOUT CONTRAST TECHNIQUE: Contiguous axial images were obtained from the base of the skull through the vertex without intravenous contrast. COMPARISON:  10/08/2014, MRI 07/07/2014 FINDINGS: Brain: No acute territorial infarction or hemorrhage. No focal mass, mass effect or midline shift. Old infarcts in the right frontal lobe and left cerebellum. Prominent cerebellar atrophy. Mild to moderate supratentorial atrophy. Ventricles are stable in size. Mild to moderate periventricular white matter small vessel changes Vascular: No hyperdense vessels.  Carotid artery calcifications. Skull: No fracture.  Mastoid air cells are clear. Sinuses/Orbits: No acute finding. Other: None IMPRESSION: No CT evidence for acute intracranial abnormality. Other chronic changes as described above. Electronically Signed   By: Donavan Foil M.D.   On: 11/12/2016 01:58   Micro Results: No results found for this or any previous visit (from the past 240 hour(s)). Studies/Results: Dg Chest 2 View  Result Date: 11/12/2016 CLINICAL DATA:  Wheezing EXAM: CHEST  2 VIEW COMPARISON:  01/31/2014 FINDINGS: Postsurgical changes of mediastinum with valvular replacement x2. Surgical clips in the right axilla and over the right breast.  There is mild cardiomegaly. There is mild central congestion and diffuse interstitial prominence suggesting mild edema. No focal consolidation. Stable pleural thickening along  the fissure on lateral image. No pneumothorax. IMPRESSION: Cardiomegaly with mild central congestion and diffuse interstitial prominence suggestive of mild edema. Electronically Signed   By: Donavan Foil M.D.   On: 11/12/2016 00:33   Ct Head Wo Contrast  Result Date: 11/12/2016 CLINICAL DATA:  Wheezing dizziness EXAM: CT HEAD WITHOUT CONTRAST TECHNIQUE: Contiguous axial images were obtained from the base of the skull through the vertex without intravenous contrast. COMPARISON:  10/08/2014, MRI 07/07/2014 FINDINGS: Brain: No acute territorial infarction or hemorrhage. No focal mass, mass effect or midline shift. Old infarcts in the right frontal lobe and left cerebellum. Prominent cerebellar atrophy. Mild to moderate supratentorial atrophy. Ventricles are stable in size. Mild to moderate periventricular white matter small vessel changes Vascular: No hyperdense vessels.  Carotid artery calcifications. Skull: No fracture.  Mastoid air cells are clear. Sinuses/Orbits: No acute finding. Other: None IMPRESSION: No CT evidence for acute intracranial abnormality. Other chronic changes as described above. Electronically Signed   By: Donavan Foil M.D.   On: 11/12/2016 01:58   Medications:  I have reviewed the patient's current medications Scheduled Meds: . albuterol  10 mg/hr Nebulization Once  . atorvastatin  20 mg Oral QPM  . cefTRIAXone (ROCEPHIN)  IV  1 g Intravenous Q24H  . diltiazem  180 mg Oral Daily  . docusate sodium  100 mg Oral BID  . escitalopram  10 mg Oral Daily  . furosemide  20 mg Intravenous Daily  . insulin aspart  0-5 Units Subcutaneous QHS  . insulin aspart  0-9 Units Subcutaneous TID WC  . ipratropium-albuterol  3 mL Nebulization Q6H WA  . levothyroxine  112 mcg Oral QAC breakfast  . loratadine  10 mg Oral Daily   . losartan  50 mg Oral Daily  . metFORMIN  500 mg Oral BID WC  . predniSONE  40 mg Oral Q breakfast  . Warfarin - Pharmacist Dosing Inpatient   Does not apply q1800   Continuous Infusions: PRN Meds:.acetaminophen **OR** acetaminophen, fluticasone, guaiFENesin-codeine, senna-docusate   Assessment/Plan: #1. Acute on chronic diastolic heart failure. Echo reveals an ejection fraction of 60-65% with grade 2 diastolic dysfunction. She has responded well to intravenous Lasix. BNP is only minimally elevated. #2. Status post mitral and aortic bioprosthetic valve replacements. Valves appear stable by echo. INR 3.6 today. Continue Coumadin. #3. UTI. Urine culture pending. Continue Rocephin. #4. Diabetes. Glucoses were elevated yesterday after corticosteroid use. Glucose is 263 this morning. Continue sliding scale NovoLog. #5. Leukocytosis. White count increased from 7-22 likely due to corticosteroid use. No fever. #6. Hypokalemia. Resolved. Potassium of 3.1 to 4.5. Active Problems:   Gastroesophageal reflux disease   Hyperlipidemia   Hypertension   Hypothyroidism   Chronic anticoagulation   Dyspnea on exertion   Edema   Asthma, cough variant   Acute pulmonary edema (HCC)   Severe mitral valve stenosis   Moderate aortic stenosis   Atrial fibrillation ([place])   Encounter for therapeutic drug monitoring   Asthma exacerbation   Acute CHF (congestive heart failure) ([place])     LOS: 0 days   Daren Doswell 11/13/2016, 9:21 AM

## 2016-11-14 LAB — MRSA PCR SCREENING: MRSA by PCR: NEGATIVE

## 2016-11-14 LAB — PROTIME-INR
INR: 3.18
Prothrombin Time: 33.3 seconds — ABNORMAL HIGH (ref 11.4–15.2)

## 2016-11-14 MED ORDER — CEFUROXIME AXETIL 250 MG PO TABS: 250.0000 mg | ORAL_TABLET | Freq: Two times a day (BID) | ORAL | 0 refills | Status: DC

## 2016-11-14 MED ORDER — PREDNISONE 20 MG PO TABS: 20.0000 mg | ORAL_TABLET | Freq: Every day | ORAL | 0 refills | Status: DC

## 2016-11-14 NOTE — Discharge Summary (Signed)
Physician Discharge Summary  [name] [id number] DOB: 09-29-1946 DOA: 11/12/2016   Admit date: 11/12/2016 Discharge date: 11/14/2016  Discharge Diagnoses:  Active Problems:   Gastroesophageal reflux disease   Hyperlipidemia   Hypertension   Hypothyroidism   Chronic anticoagulation   Dyspnea on exertion   Edema   Asthma, cough variant   Acute pulmonary edema (HCC)   Severe mitral valve stenosis   Moderate aortic stenosis   Atrial fibrillation ([place])   Encounter for therapeutic drug monitoring   Asthma exacerbation   Acute CHF (congestive heart failure) ([place])    Wt Readings from Last 3 Encounters:  11/14/16 180 lb 1.6 oz (81.7 kg)  05/10/16 169 lb (76.7 kg)  11/09/15 169 lb (76.7 kg)     Hospital Course:  This patient is a 71 year old female who presented with shortness of breath, coughing and wheezing. Her chest x-ray revealed interstitial prominence suggestive of mild edema. She was treated with IV Lasix. Echo reveals normal left ventricular function with an EF of 60-65%. She has grade 2 diastolic dysfunction. She is status post prosthetic aortic and mitral valve replacements. She was felt to have acute on chronic diastolic heart failure which responded well to IV Lasix with resolution of her shortness of breath and wheezing. She also has a history of asthma and has been treated with nebulizer treatments, corticosteroids and antibiotics. Her white count was initially normal but increased to 22,000 likely secondary to corticosteroid administration. She has been tapered to oral prednisone. She will continue Qvar and albuterol.  She is breathing comfortably with no wheezing or shortness of breath on the morning of 2/12. She was felt to be stable for discharge. She will continue Lasix 40 mg daily with a second dose in the afternoon if edema or shortness of breath is noted.  Urinalysis was consistent with UTI. She was treated with Rocephin. Culture remains pending. This will be  followed up as an outpatient. The patient was seen in follow-up in the office in one week.  Exam at discharge reveals clear lungs with a regular heart rate and no peripheral edema. Condition at discharge is much improved.  Anticoagulation with Coumadin will be continued at her usual dose. INR today is 3.1. Coumadin clinic has been notified.   Discharge Instructions  Discharge Instructions    Diet - low sodium heart healthy    Complete by:  As directed    Increase activity slowly    Complete by:  As directed      Allergies as of 11/14/2016      Reactions   Amiodarone Rash      Medication List    TAKE these medications   atorvastatin 20 MG tablet Commonly known as:  LIPITOR Take 20 mg by mouth every evening.   cefUROXime 250 MG tablet Commonly known as:  CEFTIN Take 1 tablet (250 mg total) by mouth 2 (two) times daily with a meal.   diltiazem 180 MG 24 hr capsule Commonly known as:  CARDIZEM CD Take 1 capsule (180 mg total) by mouth daily.   escitalopram 10 MG tablet Commonly known as:  LEXAPRO Take 10 mg by mouth daily.   fexofenadine 30 MG tablet Commonly known as:  ALLEGRA Take 30 mg by mouth 2 (two) times daily.   fluticasone 50 MCG/ACT nasal spray Commonly known as:  FLONASE Place 1 spray into both nostrils daily as needed.   furosemide 40 MG tablet Commonly known as:  LASIX TAKE ONE TABLET BY MOUTH ONCE DAILY.MAY  TAKE AN ADDITIONAL TABLET AT BEDTIME AS NEEDED FOR SWELLING.   levothyroxine 112 MCG tablet Commonly known as:  SYNTHROID, LEVOTHROID Take 1 tablet by mouth daily.   losartan 50 MG tablet Commonly known as:  COZAAR Take 50 mg by mouth daily.   metFORMIN 500 MG tablet Commonly known as:  GLUCOPHAGE Take 1 tablet (500 mg total) by mouth 2 (two) times daily with a meal.   metoprolol tartrate 25 MG tablet Commonly known as:  LOPRESSOR TAKE (1) TABLET BY MOUTH TWICE DAILY.   multivitamin with minerals Tabs tablet Take 1 tablet by mouth  daily.   potassium chloride SA 20 MEQ tablet Commonly known as:  K-DUR,KLOR-CON Take 20 mEq by mouth daily.   predniSONE 20 MG tablet Commonly known as:  DELTASONE Take 1 tablet (20 mg total) by mouth daily with breakfast.   QVAR 80 MCG/ACT inhaler Generic drug:  beclomethasone   saccharomyces boulardii 250 MG capsule Commonly known as:  FLORASTOR Take 250 mg by mouth daily.   VENTOLIN HFA 108 (90 Base) MCG/ACT inhaler Generic drug:  albuterol Inhale 2 puffs into the lungs every 4 (four) hours as needed for wheezing or shortness of breath.   warfarin 2.5 MG tablet Commonly known as:  COUMADIN TAKE AS DIRECTED PER COUMADIN CLINIC. What changed:  See the new instructions.        [name] 11/14/2016

## 2016-11-15 LAB — URINE CULTURE: Culture: >=100000 — AB

## 2016-11-16 ENCOUNTER — Telehealth: Payer: Self-pay | Admitting: *Deleted

## 2016-11-16 NOTE — Telephone Encounter (Signed)
INR 3.1 post hosp / please call w/ instructions / tg

## 2016-11-16 NOTE — Telephone Encounter (Signed)
Spoke with pt's husband.  Pt will finish Ceftin bid and prednisone 10mg  daily on Saturday.  Told to have pt continue coumadin 3.75mg  daily except 5mg  on Tues,Thurs and Sat.  Pt to eat extra salads/greens till Saturday and come for INR check on Monday 11/21/16.

## 2016-11-21 ENCOUNTER — Ambulatory Visit (INDEPENDENT_AMBULATORY_CARE_PROVIDER_SITE_OTHER): Payer: Medicare Other | Admitting: *Deleted

## 2016-11-21 DIAGNOSIS — I639 Cerebral infarction, unspecified: Secondary | ICD-10-CM

## 2016-11-21 DIAGNOSIS — I4891 Unspecified atrial fibrillation: Secondary | ICD-10-CM

## 2016-11-21 DIAGNOSIS — Z5181 Encounter for therapeutic drug level monitoring: Secondary | ICD-10-CM | POA: Diagnosis not present

## 2016-11-21 LAB — POCT INR: INR: 2

## 2016-11-21 MED ORDER — WARFARIN SODIUM 2.5 MG PO TABS: ORAL_TABLET | ORAL | 6 refills | Status: DC

## 2016-11-28 ENCOUNTER — Ambulatory Visit (HOSPITAL_COMMUNITY)
Admission: RE | Admit: 2016-11-28 | Discharge: 2016-11-28 | Disposition: A | Discharge status: Home / Self Care | Payer: Medicare Other | Source: Ambulatory Visit | Attending: Internal Medicine | Admitting: Internal Medicine

## 2016-11-28 ENCOUNTER — Encounter (HOSPITAL_COMMUNITY): Payer: Self-pay

## 2016-11-28 DIAGNOSIS — Z1231 Encounter for screening mammogram for malignant neoplasm of breast: Secondary | ICD-10-CM | POA: Diagnosis present

## 2016-11-30 ENCOUNTER — Ambulatory Visit (INDEPENDENT_AMBULATORY_CARE_PROVIDER_SITE_OTHER): Payer: Medicare Other | Admitting: *Deleted

## 2016-11-30 DIAGNOSIS — I639 Cerebral infarction, unspecified: Secondary | ICD-10-CM

## 2016-11-30 DIAGNOSIS — Z5181 Encounter for therapeutic drug level monitoring: Secondary | ICD-10-CM | POA: Diagnosis not present

## 2016-11-30 DIAGNOSIS — I4891 Unspecified atrial fibrillation: Secondary | ICD-10-CM

## 2016-11-30 LAB — POCT INR: INR: 3.9

## 2016-12-14 ENCOUNTER — Ambulatory Visit (INDEPENDENT_AMBULATORY_CARE_PROVIDER_SITE_OTHER): Payer: Medicare Other | Admitting: *Deleted

## 2016-12-14 DIAGNOSIS — I639 Cerebral infarction, unspecified: Secondary | ICD-10-CM

## 2016-12-14 DIAGNOSIS — I4891 Unspecified atrial fibrillation: Secondary | ICD-10-CM

## 2016-12-14 DIAGNOSIS — Z5181 Encounter for therapeutic drug level monitoring: Secondary | ICD-10-CM | POA: Diagnosis not present

## 2016-12-14 LAB — POCT INR: INR: 3.4

## 2017-01-04 ENCOUNTER — Ambulatory Visit (INDEPENDENT_AMBULATORY_CARE_PROVIDER_SITE_OTHER): Payer: Medicare Other | Admitting: *Deleted

## 2017-01-04 DIAGNOSIS — Z5181 Encounter for therapeutic drug level monitoring: Secondary | ICD-10-CM

## 2017-01-04 DIAGNOSIS — I4891 Unspecified atrial fibrillation: Secondary | ICD-10-CM

## 2017-01-04 DIAGNOSIS — I639 Cerebral infarction, unspecified: Secondary | ICD-10-CM

## 2017-01-04 LAB — POCT INR: INR: 3.8

## 2017-01-23 ENCOUNTER — Ambulatory Visit (INDEPENDENT_AMBULATORY_CARE_PROVIDER_SITE_OTHER): Payer: Medicare Other | Admitting: *Deleted

## 2017-01-23 DIAGNOSIS — Z5181 Encounter for therapeutic drug level monitoring: Secondary | ICD-10-CM | POA: Diagnosis not present

## 2017-01-23 DIAGNOSIS — I4891 Unspecified atrial fibrillation: Secondary | ICD-10-CM

## 2017-01-23 DIAGNOSIS — I639 Cerebral infarction, unspecified: Secondary | ICD-10-CM | POA: Diagnosis not present

## 2017-01-23 LAB — POCT INR: INR: 3.1

## 2017-02-13 ENCOUNTER — Ambulatory Visit (INDEPENDENT_AMBULATORY_CARE_PROVIDER_SITE_OTHER): Payer: Medicare Other | Admitting: *Deleted

## 2017-02-13 DIAGNOSIS — Z5181 Encounter for therapeutic drug level monitoring: Secondary | ICD-10-CM

## 2017-02-13 DIAGNOSIS — I4891 Unspecified atrial fibrillation: Secondary | ICD-10-CM

## 2017-02-13 DIAGNOSIS — I639 Cerebral infarction, unspecified: Secondary | ICD-10-CM | POA: Diagnosis not present

## 2017-02-13 LAB — POCT INR: INR: 3.1

## 2017-02-14 ENCOUNTER — Encounter: Payer: Self-pay | Admitting: Cardiology

## 2017-02-20 ENCOUNTER — Ambulatory Visit (INDEPENDENT_AMBULATORY_CARE_PROVIDER_SITE_OTHER): Payer: Medicare Other | Admitting: *Deleted

## 2017-02-20 ENCOUNTER — Encounter (HOSPITAL_COMMUNITY): Payer: Self-pay | Admitting: *Deleted

## 2017-02-20 ENCOUNTER — Emergency Department (HOSPITAL_COMMUNITY): Payer: Medicare Other

## 2017-02-20 ENCOUNTER — Emergency Department (HOSPITAL_COMMUNITY)
Admission: EM | Admit: 2017-02-20 | Discharge: 2017-02-20 | Disposition: A | Discharge status: Home / Self Care | Payer: Medicare Other | Attending: Emergency Medicine | Admitting: Emergency Medicine

## 2017-02-20 DIAGNOSIS — I4891 Unspecified atrial fibrillation: Secondary | ICD-10-CM | POA: Diagnosis not present

## 2017-02-20 DIAGNOSIS — I509 Heart failure, unspecified: Secondary | ICD-10-CM | POA: Diagnosis not present

## 2017-02-20 DIAGNOSIS — J209 Acute bronchitis, unspecified: Secondary | ICD-10-CM

## 2017-02-20 DIAGNOSIS — N182 Chronic kidney disease, stage 2 (mild): Secondary | ICD-10-CM | POA: Diagnosis not present

## 2017-02-20 DIAGNOSIS — J45909 Unspecified asthma, uncomplicated: Secondary | ICD-10-CM | POA: Insufficient documentation

## 2017-02-20 DIAGNOSIS — Z853 Personal history of malignant neoplasm of breast: Secondary | ICD-10-CM | POA: Diagnosis not present

## 2017-02-20 DIAGNOSIS — Z5181 Encounter for therapeutic drug level monitoring: Secondary | ICD-10-CM | POA: Diagnosis not present

## 2017-02-20 DIAGNOSIS — R05 Cough: Secondary | ICD-10-CM | POA: Diagnosis present

## 2017-02-20 DIAGNOSIS — Z79899 Other long term (current) drug therapy: Secondary | ICD-10-CM | POA: Diagnosis not present

## 2017-02-20 DIAGNOSIS — E039 Hypothyroidism, unspecified: Secondary | ICD-10-CM | POA: Diagnosis not present

## 2017-02-20 DIAGNOSIS — I130 Hypertensive heart and chronic kidney disease with heart failure and stage 1 through stage 4 chronic kidney disease, or unspecified chronic kidney disease: Secondary | ICD-10-CM | POA: Insufficient documentation

## 2017-02-20 DIAGNOSIS — I639 Cerebral infarction, unspecified: Secondary | ICD-10-CM

## 2017-02-20 DIAGNOSIS — Z7984 Long term (current) use of oral hypoglycemic drugs: Secondary | ICD-10-CM | POA: Insufficient documentation

## 2017-02-20 LAB — CBC WITH DIFFERENTIAL/PLATELET
BASOS ABS: 0 10*3/uL (ref 0.0–0.1)
Basophils Relative: 0 %
EOS ABS: 0 10*3/uL (ref 0.0–0.7)
EOS PCT: 0 %
HCT: 39 % (ref 36.0–46.0)
Hemoglobin: 12.7 g/dL (ref 12.0–15.0)
LYMPHS ABS: 1.1 10*3/uL (ref 0.7–4.0)
Lymphocytes Relative: 11 %
MCH: 28.5 pg (ref 26.0–34.0)
MCHC: 32.6 g/dL (ref 30.0–36.0)
MCV: 87.6 fL (ref 78.0–100.0)
Monocytes Absolute: 0.5 10*3/uL (ref 0.1–1.0)
Monocytes Relative: 5 %
Neutro Abs: 8 10*3/uL — ABNORMAL HIGH (ref 1.7–7.7)
Neutrophils Relative %: 84 %
PLATELETS: 215 10*3/uL (ref 150–400)
RBC: 4.45 MIL/uL (ref 3.87–5.11)
RDW: 14.4 % (ref 11.5–15.5)
WBC: 9.6 10*3/uL (ref 4.0–10.5)

## 2017-02-20 LAB — POCT INR: INR: 3.7

## 2017-02-20 LAB — BASIC METABOLIC PANEL
ANION GAP: 9 (ref 5–15)
BUN: 20 mg/dL (ref 6–20)
CO2: 30 mmol/L (ref 22–32)
Calcium: 9.2 mg/dL (ref 8.9–10.3)
Chloride: 96 mmol/L — ABNORMAL LOW (ref 101–111)
Creatinine, Ser: 0.93 mg/dL (ref 0.44–1.00)
GFR, EST NON AFRICAN AMERICAN: 60 mL/min — AB (ref >60)
Glucose, Bld: 183 mg/dL — ABNORMAL HIGH (ref 65–99)
Potassium: 3.7 mmol/L (ref 3.5–5.1)
SODIUM: 135 mmol/L (ref 135–145)

## 2017-02-20 LAB — TROPONIN I

## 2017-02-20 LAB — BRAIN NATRIURETIC PEPTIDE: B NATRIURETIC PEPTIDE 5: 148 pg/mL — AB (ref 0.0–100.0)

## 2017-02-20 MED ORDER — ALBUTEROL SULFATE (2.5 MG/3ML) 0.083% IN NEBU
5.0000 mg | INHALATION_SOLUTION | Freq: Once | RESPIRATORY_TRACT | Status: AC
  Administered 2017-02-20: 5 mg via RESPIRATORY_TRACT
  Filled 2017-02-20: qty 6

## 2017-02-20 MED ORDER — IPRATROPIUM BROMIDE 0.02 % IN SOLN
0.5000 mg | Freq: Once | RESPIRATORY_TRACT | Status: AC
  Administered 2017-02-20: 0.5 mg via RESPIRATORY_TRACT
  Filled 2017-02-20: qty 2.5

## 2017-02-20 MED ORDER — BENZONATATE 100 MG PO CAPS: 100.0000 mg | ORAL_CAPSULE | Freq: Three times a day (TID) | ORAL | 0 refills | Status: DC | PRN

## 2017-02-20 NOTE — ED Provider Notes (Signed)
[facility] DEPT Provider Note   CSN: [id number] Arrival date & time: 02/20/17  0015     History   Chief Complaint Chief Complaint  Patient presents with  . Cough    HPI [name] is a 71 y.o. female.  HPI  71 year old female with a history of cough for over one week. Has been taking doxycycline and prednisone after seeing her PCP, Dr. Willey Blade. Cough seemed to be getting a little bit better but now is worse again. She had a very prolonged coughing episode earlier today. She has had on and off leg swelling and has a history of heart failure. Has been taking her Lasix as prescribed. At this moment she states her legs are not swollen. Her chest has been tight but no chest pain. She gets shortness of breath after a certain amount of exertion but no shortness of breath at rest. Cough is nonproductive. No fevers. She has been using her albuterol inhaler a couple times per day.  Past Medical History:  Diagnosis Date  . Anemia   . Atrial fibrillation ([place])   . Breast carcinoma ([place]) 1995   1995  . Cardioembolic stroke ([place]) 10/624   Right frontal in 01/2012; normal carotid ultrasound; possible LAA thrombus by TEE; virtual complete neurologic recovery  . Chronic kidney disease, stage 2, mildly decreased GFR    GFR of approximately 60  . Depression   . Diverticulosis of colon (without mention of hemorrhage) 2012   Dr. Laural Golden  . Fasting hyperglycemia    120 fasting  . Gastroesophageal reflux disease   . Gastroparesis   . Hemorrhoids   . Hyperlipidemia   . Hypertension    pt denies 05/30/13     Dr Kathe Mariner  . Hyponatremia   . Rheumatic heart disease    a. 07/2013 Echo: Ef 55-60%, Mod AS/AI, Mod-Sev MS, sev dil LA, PASP 58;  b. 07/2013 TEE: EF 35-40%, mild-mod AS/AI, mod-sev MS, LA smoke;  c. 07/2013 Cath: elev R heart pressures, nl cors.  . Shortness of breath     Patient Active Problem List   Diagnosis Date Noted  . Asthma exacerbation 11/12/2016  . Acute CHF  (congestive heart failure) ([place]) 11/12/2016  . Synovial cyst of lumbar spine 05/13/2014  . Encounter for therapeutic drug monitoring 11/06/2013  . Heart valve replaced by other means 08/05/2013  . Hyponatremia   . Atrial fibrillation ([place])   . Moderate aortic stenosis 07/12/2013  . Moderate aortic insufficiency 07/12/2013  . Severe mitral valve stenosis 07/09/2013  . Mitral stenosis 07/08/2013  . Acute respiratory failure with hypoxia ([place]) 07/07/2013  . Acute pulmonary edema ([place]) 07/07/2013  . Pleural effusion, bilateral 07/07/2013  . Chest pain 07/07/2013  . Asthma, cough variant 03/27/2013  . Chronic cough 09/17/2012  . Dyspnea on exertion 06/13/2012  . Edema 06/13/2012  . Rheumatic heart disease 06/13/2012  . Fasting hyperglycemia   . Chronic anticoagulation 01/26/2012  . Hypothyroidism 01/23/2012  . Cardioembolic stroke ([place]) 94/85/4627  . Hyperlipidemia   . Hypertension   . Breast carcinoma ([place])   . Gastroesophageal reflux disease     Past Surgical History:  Procedure Laterality Date  . AORTIC VALVE REPLACEMENT N/A 07/18/2013   Procedure: AORTIC VALVE REPLACEMENT (AVR);  Surgeon: Gaye Pollack, MD;  Location: [place];  Service: Open Heart Surgery;  Laterality: N/A;  . BACK SURGERY    . BREAST LUMPECTOMY Right 1995  . CARDIAC CATHETERIZATION    . CARDIOVERSION N/A 07/26/2013  Procedure: CARDIOVERSION;  Surgeon: Fay Records, MD;  Location: [facility] ENDOSCOPY;  Service: Cardiovascular;  Laterality: N/A;  . COLONOSCOPY  2012   Negative screening procedure  . DILATION AND CURETTAGE OF UTERUS    . ESOPHAGEAL MANOMETRY N/A 06/17/2013   Procedure: ESOPHAGEAL MANOMETRY (EM);  Surgeon: Sable Feil, MD;  Location: [facility] ENDOSCOPY;  Service: Endoscopy;  Laterality: N/A;  . INTRAOPERATIVE TRANSESOPHAGEAL ECHOCARDIOGRAM N/A 07/18/2013   Procedure: INTRAOPERATIVE TRANSESOPHAGEAL ECHOCARDIOGRAM;  Surgeon: Gaye Pollack, MD;  Location: [place] OR;  Service: Open Heart Surgery;   Laterality: N/A;  . LEFT AND RIGHT HEART CATHETERIZATION WITH CORONARY ANGIOGRAM N/A 07/10/2013   Procedure: LEFT AND RIGHT HEART CATHETERIZATION WITH CORONARY ANGIOGRAM;  Surgeon: Burnell Blanks, MD;  Location: [facility] CATH LAB;  Service: Cardiovascular;  Laterality: N/A;  . LUMBAR LAMINECTOMY/DECOMPRESSION MICRODISCECTOMY Right 05/13/2014   Procedure: LUMBAR LAMINECTOMY/DECOMPRESSION MICRODISCECTOMY 1 LEVEL  lumbar four/five;  Surgeon: Faythe Ghee, MD;  Location: [facility] NEURO ORS;  Service: Neurosurgery;  Laterality: Right;  . MITRAL VALVE REPLACEMENT N/A 07/18/2013   Procedure: MITRAL VALVE (MV) REPLACEMENT;  Surgeon: Gaye Pollack, MD;  Location: [place] OR;  Service: Open Heart Surgery;  Laterality: N/A;  . MITRAL VALVE [place], closed mitral valvulotomy by finger fracture  . TEE WITHOUT CARDIOVERSION  01/24/2012   Procedure: TRANSESOPHAGEAL ECHOCARDIOGRAM (TEE);  Surgeon: Lelon Perla, MD;  Location[facility] ENDOSCOPY;  Service: Cardiovascular;  Laterality: N/A;  . TEE WITHOUT CARDIOVERSION N/A 07/11/2013   Procedure: TRANSESOPHAGEAL ECHOCARDIOGRAM (TEE);  Surgeon: Thayer Headings, MD;  Location: [place];  Service: Cardiovascular;  Laterality: N/A;  . TEE WITHOUT CARDIOVERSION N/A 07/26/2013   Procedure: TRANSESOPHAGEAL ECHOCARDIOGRAM (TEE);  Surgeon: Fay Records, MD;  Location[facility] ENDOSCOPY;  Service: Cardiovascular;  Laterality: N/A;  . TUBAL LIGATION  1973    OB History    No data available       Home Medications    Prior to Admission medications   Medication Sig Start Date End Date Taking? Authorizing Provider  albuterol (VENTOLIN HFA) 108 (90 BASE) MCG/ACT inhaler Inhale 2 puffs into the lungs every 4 (four) hours as needed for wheezing or shortness of breath.     [provider]  atorvastatin (LIPITOR) 20 MG tablet Take 20 mg by mouth every evening.     [provider]  benzonatate (TESSALON) 100 MG capsule Take 1 capsule (100 mg total) by  mouth 3 (three) times daily as needed for cough. 02/20/17   Sherwood Gambler, MD  cefUROXime (CEFTIN) 250 MG tablet Take 1 tablet (250 mg total) by mouth 2 (two) times daily with a meal. 11/14/16   [name], MD  diltiazem (CARDIZEM CD) 180 MG 24 hr capsule Take 1 capsule (180 mg total) by mouth daily. 08/03/13   Collins, Gina L, PA-C  escitalopram (LEXAPRO) 10 MG tablet Take 10 mg by mouth daily.  04/10/15   [provider]  fexofenadine (ALLEGRA) 30 MG tablet Take 30 mg by mouth 2 (two) times daily.    [provider]  fluticasone (FLONASE) 50 MCG/ACT nasal spray Place 1 spray into both nostrils daily as needed.  09/05/13   Arnoldo Lenis, MD  furosemide (LASIX) 40 MG tablet TAKE ONE TABLET BY MOUTH ONCE DAILY.MAY TAKE AN ADDITIONAL TABLET AT BEDTIME AS NEEDED FOR SWELLING. 11/09/16   Arnoldo Lenis, MD  levothyroxine (SYNTHROID, LEVOTHROID) 112 MCG tablet Take 1 tablet by mouth daily. 10/17/13   [provider]  losartan (  COZAAR) 50 MG tablet Take 50 mg by mouth daily.    [provider]  metFORMIN (GLUCOPHAGE) 500 MG tablet Take 1 tablet (500 mg total) by mouth 2 (two) times daily with a meal. 08/03/13   Collins, Gina L, PA-C  metoprolol tartrate (LOPRESSOR) 25 MG tablet TAKE (1) TABLET BY MOUTH TWICE DAILY. 11/02/16   Arnoldo Lenis, MD  Multiple Vitamin (MULITIVITAMIN WITH MINERALS) TABS Take 1 tablet by mouth daily.    [provider]  potassium chloride SA (K-DUR,KLOR-CON) 20 MEQ tablet Take 20 mEq by mouth daily. 08/14/13   Gaye Pollack, MD  predniSONE (DELTASONE) 20 MG tablet Take 1 tablet (20 mg total) by mouth daily with breakfast. 11/14/16   [name], MD  QVAR 80 MCG/ACT inhaler  04/07/15   [provider]  saccharomyces boulardii (FLORASTOR) 250 MG capsule Take 250 mg by mouth daily.    [provider]  warfarin (COUMADIN) 2.5 MG tablet Take 1 1/2 tablets daily except 2 tablets on Tuesdays, Thursdays and Saturdays 11/21/16    Arnoldo Lenis, MD    Family History Family History  Problem Relation Age of Onset  . Heart disease Brother 70       MI  . Rheum arthritis Maternal Grandmother   . Asthma Maternal Grandfather   . Hypothyroidism Mother   . Diabetes Sister   . Cirrhosis Father   . Cervical cancer Sister        malignant neoplasm of cervix uteri    Social History Social History  Substance Use Topics  . Smoking status: Never Smoker  . Smokeless tobacco: Never Used  . Alcohol use No     Allergies   Amiodarone   Review of Systems Review of Systems  Constitutional: Negative for fever.  HENT: Positive for congestion.   Respiratory: Positive for cough, chest tightness, shortness of breath and wheezing.   Cardiovascular: Positive for leg swelling (earlier, none now). Negative for chest pain.  All other systems reviewed and are negative.    Physical Exam Updated Vital Signs BP (!) 154/71 (BP Location: Left Arm)   Pulse 66   Temp 98.4 F (36.9 C)   Resp 18   Ht 5' 2.5" (1.588 m)   Wt 78 kg (172 lb)   SpO2 96%   BMI 30.96 kg/m   Physical Exam  Constitutional: She is oriented to person, place, and time. She appears well-developed and well-nourished. No distress.  HENT:  Head: Normocephalic and atraumatic.  Right Ear: External ear normal.  Left Ear: External ear normal.  Nose: Nose normal.  Eyes: Right eye exhibits no discharge. Left eye exhibits no discharge.  Cardiovascular: Normal rate, regular rhythm and normal heart sounds.   Pulmonary/Chest: Effort normal. No accessory muscle usage. No tachypnea. She has wheezes (diffuse, expiratory).  Abdominal: Soft. There is no tenderness.  Musculoskeletal: She exhibits no edema.  Neurological: She is alert and oriented to person, place, and time.  Skin: Skin is warm and dry. She is not diaphoretic.  Nursing note and vitals reviewed.    ED Treatments / Results  Labs (all labs ordered are listed, but only abnormal results are  displayed) Labs Reviewed  BASIC METABOLIC PANEL - Abnormal; Notable for the following:       Result Value   Chloride 96 (*)    Glucose, Bld 183 (*)    GFR calc non Af Amer 60 (*)    All other components within normal limits  BRAIN NATRIURETIC PEPTIDE -  Abnormal; Notable for the following:    B Natriuretic Peptide 148.0 (*)    All other components within normal limits  CBC WITH DIFFERENTIAL/PLATELET - Abnormal; Notable for the following:    Neutro Abs 8.0 (*)    All other components within normal limits  TROPONIN I    EKG  EKG Interpretation None       Radiology Dg Chest 2 View  Result Date: 02/20/2017 CLINICAL DATA:  Cough for 5 days. EXAM: CHEST  2 VIEW COMPARISON:  Radiographs 11/12/2016 FINDINGS: Post median sternotomy. Stable cardiomediastinal contours with prosthetic aortic and mitral valves. Vascular congestion. Mild peribronchial cuffing is similar to prior exam. No confluent airspace disease. No pleural fluid or pneumothorax. IMPRESSION: Stable cardiomegaly and vascular congestion. Mild peribronchial cuffing may be pulmonary edema or bronchial thickening, as seen in bronchitis. Electronically Signed   By: Jeb Levering M.D.   On: 02/20/2017 01:01    Procedures Procedures (including critical care time)  Medications Ordered in ED Medications  albuterol (PROVENTIL) (2.5 MG/3ML) 0.083% nebulizer solution 5 mg (5 mg Nebulization Given 02/20/17 0142)  ipratropium (ATROVENT) nebulizer solution 0.5 mg (0.5 mg Nebulization Given 02/20/17 0142)     Initial Impression / Assessment and Plan / ED Course  I have reviewed the triage vital signs and the nursing notes.  Pertinent labs & imaging results that were available during my care of the patient were reviewed by me and considered in my medical decision making (see chart for details).     Patient's symptoms are most c/w Bronchitis. Afebrile. No lobar PNA. Already on doxycycline and steroids. She is only taking her  albuterol 2x per day. She had complete relief with albuterol neb. Suspicion of PE, CHF, ACS is quite low. Increase albuterol frequency, f/u closely with PCP  Final Clinical Impressions(s) / ED Diagnoses   Final diagnoses:  Acute bronchitis, unspecified organism    New Prescriptions Discharge Medication List as of 02/20/2017  2:52 AM    START taking these medications   Details  benzonatate (TESSALON) 100 MG capsule Take 1 capsule (100 mg total) by mouth 3 (three) times daily as needed for cough., Starting Mon 02/20/2017, Print         Sherwood Gambler, MD 02/20/17 [phone]

## 2017-02-20 NOTE — Discharge Instructions (Signed)
Use your albuterol inhaler 2 puffs every 4 hours for the next 48 hours. If you need more or your symptoms worsen, return to the ER

## 2017-02-20 NOTE — ED Triage Notes (Signed)
Pt c/o cough that started x 5 days ago; pt went to see Dr. Willey Blade and was given antibiotics and cough meds and the cough has gotten worse

## 2017-02-28 ENCOUNTER — Ambulatory Visit (INDEPENDENT_AMBULATORY_CARE_PROVIDER_SITE_OTHER): Payer: Medicare Other | Admitting: *Deleted

## 2017-02-28 DIAGNOSIS — I4891 Unspecified atrial fibrillation: Secondary | ICD-10-CM | POA: Diagnosis not present

## 2017-02-28 DIAGNOSIS — I639 Cerebral infarction, unspecified: Secondary | ICD-10-CM | POA: Diagnosis not present

## 2017-02-28 DIAGNOSIS — Z5181 Encounter for therapeutic drug level monitoring: Secondary | ICD-10-CM

## 2017-02-28 LAB — POCT INR: INR: 4.8

## 2017-03-08 ENCOUNTER — Ambulatory Visit (INDEPENDENT_AMBULATORY_CARE_PROVIDER_SITE_OTHER): Payer: Medicare Other | Admitting: *Deleted

## 2017-03-08 DIAGNOSIS — I639 Cerebral infarction, unspecified: Secondary | ICD-10-CM

## 2017-03-08 DIAGNOSIS — I4891 Unspecified atrial fibrillation: Secondary | ICD-10-CM

## 2017-03-08 DIAGNOSIS — Z5181 Encounter for therapeutic drug level monitoring: Secondary | ICD-10-CM | POA: Diagnosis not present

## 2017-03-08 LAB — POCT INR: INR: 7

## 2017-03-13 ENCOUNTER — Ambulatory Visit (INDEPENDENT_AMBULATORY_CARE_PROVIDER_SITE_OTHER): Payer: Medicare Other | Admitting: *Deleted

## 2017-03-13 DIAGNOSIS — Z5181 Encounter for therapeutic drug level monitoring: Secondary | ICD-10-CM | POA: Diagnosis not present

## 2017-03-13 DIAGNOSIS — I4891 Unspecified atrial fibrillation: Secondary | ICD-10-CM | POA: Diagnosis not present

## 2017-03-13 DIAGNOSIS — I639 Cerebral infarction, unspecified: Secondary | ICD-10-CM | POA: Diagnosis not present

## 2017-03-13 LAB — POCT INR: INR: 3.2

## 2017-03-27 ENCOUNTER — Ambulatory Visit (INDEPENDENT_AMBULATORY_CARE_PROVIDER_SITE_OTHER): Payer: Medicare Other | Admitting: *Deleted

## 2017-03-27 DIAGNOSIS — Z5181 Encounter for therapeutic drug level monitoring: Secondary | ICD-10-CM

## 2017-03-27 DIAGNOSIS — I4891 Unspecified atrial fibrillation: Secondary | ICD-10-CM

## 2017-03-27 DIAGNOSIS — I639 Cerebral infarction, unspecified: Secondary | ICD-10-CM | POA: Diagnosis not present

## 2017-03-27 LAB — POCT INR: INR: 4.9

## 2017-04-10 ENCOUNTER — Ambulatory Visit (INDEPENDENT_AMBULATORY_CARE_PROVIDER_SITE_OTHER): Payer: Medicare Other | Admitting: *Deleted

## 2017-04-10 DIAGNOSIS — Z5181 Encounter for therapeutic drug level monitoring: Secondary | ICD-10-CM | POA: Diagnosis not present

## 2017-04-10 DIAGNOSIS — I639 Cerebral infarction, unspecified: Secondary | ICD-10-CM

## 2017-04-10 DIAGNOSIS — I4891 Unspecified atrial fibrillation: Secondary | ICD-10-CM | POA: Diagnosis not present

## 2017-04-10 LAB — POCT INR: INR: 2.9

## 2017-04-24 ENCOUNTER — Ambulatory Visit (INDEPENDENT_AMBULATORY_CARE_PROVIDER_SITE_OTHER): Payer: Medicare Other | Admitting: *Deleted

## 2017-04-24 DIAGNOSIS — I639 Cerebral infarction, unspecified: Secondary | ICD-10-CM

## 2017-04-24 DIAGNOSIS — I4891 Unspecified atrial fibrillation: Secondary | ICD-10-CM | POA: Diagnosis not present

## 2017-04-24 DIAGNOSIS — Z5181 Encounter for therapeutic drug level monitoring: Secondary | ICD-10-CM | POA: Diagnosis not present

## 2017-04-24 LAB — POCT INR: INR: 3.3

## 2017-05-03 ENCOUNTER — Other Ambulatory Visit: Payer: Self-pay | Admitting: Cardiology

## 2017-05-15 ENCOUNTER — Ambulatory Visit (INDEPENDENT_AMBULATORY_CARE_PROVIDER_SITE_OTHER): Payer: Medicare Other | Admitting: *Deleted

## 2017-05-15 DIAGNOSIS — Z5181 Encounter for therapeutic drug level monitoring: Secondary | ICD-10-CM

## 2017-05-15 DIAGNOSIS — I639 Cerebral infarction, unspecified: Secondary | ICD-10-CM

## 2017-05-15 DIAGNOSIS — I4891 Unspecified atrial fibrillation: Secondary | ICD-10-CM

## 2017-05-15 LAB — POCT INR: INR: 3.9

## 2017-05-22 ENCOUNTER — Ambulatory Visit (INDEPENDENT_AMBULATORY_CARE_PROVIDER_SITE_OTHER): Payer: Medicare Other | Admitting: Cardiology

## 2017-05-22 ENCOUNTER — Encounter: Payer: Self-pay | Admitting: Cardiology

## 2017-05-22 VITALS — BP 110/60 | HR 76 | Ht 62.5 in | Wt 170.0 lb

## 2017-05-22 DIAGNOSIS — I38 Endocarditis, valve unspecified: Secondary | ICD-10-CM | POA: Diagnosis not present

## 2017-05-22 DIAGNOSIS — E782 Mixed hyperlipidemia: Secondary | ICD-10-CM

## 2017-05-22 DIAGNOSIS — I4891 Unspecified atrial fibrillation: Secondary | ICD-10-CM | POA: Diagnosis not present

## 2017-05-22 DIAGNOSIS — I6523 Occlusion and stenosis of bilateral carotid arteries: Secondary | ICD-10-CM | POA: Diagnosis not present

## 2017-05-22 MED ORDER — ATORVASTATIN CALCIUM 40 MG PO TABS: 40.0000 mg | ORAL_TABLET | Freq: Every day | ORAL | 3 refills | Status: DC

## 2017-05-22 NOTE — Progress Notes (Signed)
Clinical Summary [name] is a 71 y.o.female seen today for follow up of the following medical problems.   1. Valvular heart disease  - 07/18/13 patient underwent MVR with 25 mm Edwards Magna-ease pericardial valve and also AVR with 21 mm Edwards Magna-ease pericardial valve    - no recent SOB/DOE, walks up to a mile without troubles daily. - occasional LE edema, takes additional lasix as needed    2. Afib  - hx of cardioembolic CVA, has been on longterm anticoag  - no recent palpitations - no bleeding on coumadin  3. Hyperlipidemia - 12/2016 TC 201 TG 229 HDL 46 LDL 109 - she remains compliant with statin   4. Carotid stenosis - 07/2013 [place] with mild bilateral stenosis  SH: daughter with Crohns with disease, they spend a lot of time supporting her.   Past Medical History:  Diagnosis Date  . Anemia   . Atrial fibrillation ([place])   . Breast carcinoma ([place]) 1995   1995  . Cardioembolic stroke ([place]) 05/6760   Right frontal in 01/2012; normal carotid ultrasound; possible LAA thrombus by TEE; virtual complete neurologic recovery  . Chronic kidney disease, stage 2, mildly decreased GFR    GFR of approximately 60  . Depression   . Diverticulosis of colon (without mention of hemorrhage) 2012   Dr. Laural Golden  . Fasting hyperglycemia    120 fasting  . Gastroesophageal reflux disease   . Gastroparesis   . Hemorrhoids   . Hyperlipidemia   . Hypertension    pt denies 05/30/13     Dr Kathe Mariner  . Hyponatremia   . Rheumatic heart disease    a. 07/2013 Echo: Ef 55-60%, Mod AS/AI, Mod-Sev MS, sev dil LA, PASP 58;  b. 07/2013 TEE: EF 35-40%, mild-mod AS/AI, mod-sev MS, LA smoke;  c. 07/2013 Cath: elev R heart pressures, nl cors.  . Shortness of breath      Allergies  Allergen Reactions  . Amiodarone Rash     Current Outpatient Prescriptions  Medication Sig Dispense Refill  . albuterol (VENTOLIN HFA) 108 (90 BASE) MCG/ACT inhaler Inhale 2 puffs into the  lungs every 4 (four) hours as needed for wheezing or shortness of breath.     Marland Kitchen atorvastatin (LIPITOR) 20 MG tablet Take 20 mg by mouth every evening.     . benzonatate (TESSALON) 100 MG capsule Take 1 capsule (100 mg total) by mouth 3 (three) times daily as needed for cough. 21 capsule 0  . diltiazem (CARDIZEM CD) 180 MG 24 hr capsule Take 1 capsule (180 mg total) by mouth daily. 30 capsule 1  . escitalopram (LEXAPRO) 10 MG tablet Take 10 mg by mouth daily.     . fexofenadine (ALLEGRA) 30 MG tablet Take 30 mg by mouth 2 (two) times daily.    . fluticasone (FLONASE) 50 MCG/ACT nasal spray Place 1 spray into both nostrils daily as needed.     . furosemide (LASIX) 40 MG tablet TAKE ONE TABLET BY MOUTH ONCE DAILY.MAY TAKE AN ADDITIONAL TABLET AT BEDTIME AS NEEDED FOR SWELLING. 90 tablet 3  . levothyroxine (SYNTHROID, LEVOTHROID) 112 MCG tablet Take 1 tablet by mouth daily.    Marland Kitchen losartan (COZAAR) 50 MG tablet Take 50 mg by mouth daily.    . metFORMIN (GLUCOPHAGE) 500 MG tablet Take 1 tablet (500 mg total) by mouth 2 (two) times daily with a meal. 60 tablet 1  . metoprolol tartrate (LOPRESSOR) 25 MG tablet TAKE (1) TABLET BY  MOUTH TWICE DAILY. 180 tablet 0  . Multiple Vitamin (MULITIVITAMIN WITH MINERALS) TABS Take 1 tablet by mouth daily.    . potassium chloride SA (K-DUR,KLOR-CON) 20 MEQ tablet Take 20 mEq by mouth daily.    Marland Kitchen QVAR 80 MCG/ACT inhaler     . saccharomyces boulardii (FLORASTOR) 250 MG capsule Take 250 mg by mouth daily.    Marland Kitchen warfarin (COUMADIN) 2.5 MG tablet Take 1 1/2 tablets daily except 2 tablets on Tuesdays, Thursdays and Saturdays (Patient taking differently: Take 1 1/2 tablets daily except 2 tablets on Thursdays) 60 tablet 6   No current facility-administered medications for this visit.      Past Surgical History:  Procedure Laterality Date  . AORTIC VALVE REPLACEMENT N/A 07/18/2013   Procedure: AORTIC VALVE REPLACEMENT (AVR);  Surgeon: Gaye Pollack, MD;  Location: [place];   Service: Open Heart Surgery;  Laterality: N/A;  . BACK SURGERY    . BREAST LUMPECTOMY Right 1995  . CARDIAC CATHETERIZATION    . CARDIOVERSION N/A 07/26/2013   Procedure: CARDIOVERSION;  Surgeon: Fay Records, MD;  Location: [facility] ENDOSCOPY;  Service: Cardiovascular;  Laterality: N/A;  . COLONOSCOPY  2012   Negative screening procedure  . DILATION AND CURETTAGE OF UTERUS    . ESOPHAGEAL MANOMETRY N/A 06/17/2013   Procedure: ESOPHAGEAL MANOMETRY (EM);  Surgeon: Sable Feil, MD;  Location: [facility] ENDOSCOPY;  Service: Endoscopy;  Laterality: N/A;  . INTRAOPERATIVE TRANSESOPHAGEAL ECHOCARDIOGRAM N/A 07/18/2013   Procedure: INTRAOPERATIVE TRANSESOPHAGEAL ECHOCARDIOGRAM;  Surgeon: Gaye Pollack, MD;  Location: [place] OR;  Service: Open Heart Surgery;  Laterality: N/A;  . LEFT AND RIGHT HEART CATHETERIZATION WITH CORONARY ANGIOGRAM N/A 07/10/2013   Procedure: LEFT AND RIGHT HEART CATHETERIZATION WITH CORONARY ANGIOGRAM;  Surgeon: Burnell Blanks, MD;  Location: [facility] CATH LAB;  Service: Cardiovascular;  Laterality: N/A;  . LUMBAR LAMINECTOMY/DECOMPRESSION MICRODISCECTOMY Right 05/13/2014   Procedure: LUMBAR LAMINECTOMY/DECOMPRESSION MICRODISCECTOMY 1 LEVEL  lumbar four/five;  Surgeon: Faythe Ghee, MD;  Location: [facility] NEURO ORS;  Service: Neurosurgery;  Laterality: Right;  . MITRAL VALVE REPLACEMENT N/A 07/18/2013   Procedure: MITRAL VALVE (MV) REPLACEMENT;  Surgeon: Gaye Pollack, MD;  Location: [place] OR;  Service: Open Heart Surgery;  Laterality: N/A;  . MITRAL VALVE [place], closed mitral valvulotomy by finger fracture  . TEE WITHOUT CARDIOVERSION  01/24/2012   Procedure: TRANSESOPHAGEAL ECHOCARDIOGRAM (TEE);  Surgeon: Lelon Perla, MD;  Location: [facility] ENDOSCOPY;  Service: Cardiovascular;  Laterality: N/A;  . TEE WITHOUT CARDIOVERSION N/A 07/11/2013   Procedure: TRANSESOPHAGEAL ECHOCARDIOGRAM (TEE);  Surgeon: Thayer Headings, MD;  Location: [place];  Service: Cardiovascular;   Laterality: N/A;  . TEE WITHOUT CARDIOVERSION N/A 07/26/2013   Procedure: TRANSESOPHAGEAL ECHOCARDIOGRAM (TEE);  Surgeon: Fay Records, MD;  Location: [place];  Service: Cardiovascular;  Laterality: N/A;  . TUBAL LIGATION  1973     Allergies  Allergen Reactions  . Amiodarone Rash      Family History  Problem Relation Age of Onset  . Heart disease Brother 65       MI  . Rheum arthritis Maternal Grandmother   . Asthma Maternal Grandfather   . Hypothyroidism Mother   . Diabetes Sister   . Cirrhosis Father   . Cervical cancer Sister        malignant neoplasm of cervix uteri     Social History Ms. Hieronymus reports that she has never smoked. She has never used smokeless tobacco. Ms. Cushman reports that she  does not drink alcohol.   Review of Systems CONSTITUTIONAL: No weight loss, fever, chills, weakness or fatigue.  HEENT: Eyes: No visual loss, blurred vision, double vision or yellow sclerae.No hearing loss, sneezing, congestion, runny nose or sore throat.  SKIN: No rash or itching.  CARDIOVASCULAR: per hpi RESPIRATORY: No shortness of breath, cough or sputum.  GASTROINTESTINAL: No anorexia, nausea, vomiting or diarrhea. No abdominal pain or blood.  GENITOURINARY: No burning on urination, no polyuria NEUROLOGICAL: No headache, dizziness, syncope, paralysis, ataxia, numbness or tingling in the extremities. No change in bowel or bladder control.  MUSCULOSKELETAL: No muscle, back pain, joint pain or stiffness.  LYMPHATICS: No enlarged nodes. No history of splenectomy.  PSYCHIATRIC: No history of depression or anxiety.  ENDOCRINOLOGIC: No reports of sweating, cold or heat intolerance. No polyuria or polydipsia.  Marland Kitchen   Physical Examination Vitals:   05/22/17 0816  BP: 110/60  Pulse: 76  SpO2: 96%   Vitals:   05/22/17 0816  Weight: 170 lb (77.1 kg)  Height: 5' 2.5" (1.588 m)    Gen: resting comfortably, no acute distress HEENT: no scleral icterus, pupils equal  round and reactive, no palptable cervical adenopathy,  CV: RRR, 2/6 systolic murmur RUSB, bilateral carotid bruits Resp: Clear to auscultation bilaterally GI: abdomen is soft, non-tender, non-distended, normal bowel sounds, no hepatosplenomegaly MSK: extremities are warm, no edema.  Skin: warm, no rash Neuro:  no focal deficits Psych: appropriate affect   Diagnostic Studies 07/2013 TTE Study Conclusions  - Study data: Technically difficult study. - Left ventricle: The cavity size was normal. Wall thickness was normal. Systolic function was normal. The estimated ejection fraction was in the range of 55% to 60%. Indeterminate diastolic function. There is evidence of very high left atrial pressures (E/e' 63) - Aortic valve: Trileaflet; mildly thickened leaflets. Accurate evaluation of aortic stenosis limited in the setting of severe mitral stenosis and decreased ventricular loadin. Morphologically there does not appear to be significant stenosis. By gradient (mean PG 21 mmHg) the stenosis is moderate, by AVA VTI (0.8) stenosis is severe. Moderate regurgitation, AR PHT is 439 cm/s. Valve area: 0.78cm^2(VTI). Valve area: 0.78cm^2 (Vmax). - Mitral valve: The findings are consistent with moderate to severe stenosis. (Mean PG 12 mmHg, MVA by PHT 1.3. Cannot use MVA by VTI due to moderate AI. Valve area by pressure half-time: 1.38cm^2. - Left atrium: The atrium was severely dilated. - Right ventricle: Systolic function was mildly reduced, RV TAPSE is 1.4, TV systolic annular tissue velocity 10 cm/s. - Pulmonary arteries: Systolic pressure was moderately increased. PA peak pressure: 75mm Hg (S) (PASP based on a fairly faint spectral Doppler waveform)   07/2013 TEE Study Conclusions  - Left ventricle: Systolic function was moderately reduced. The estimated ejection fraction was in the range of 35% to 40%. - Aortic valve: There was mild to  moderate stenosis. Mild to moderate regurgitation. - Mitral valve: The findings are consistent with moderate to severe stenosis. Valve area by pressure half-time: 1.45cm^2. - Left atrium: There was spontaneous echo contrast ("smoke"). - Atrial septum: No defect or patent foramen ovale was identified. - Tricuspid valve: No evidence of vegetation. Transesophageal echocardiography. 2D and color Doppler. Height: Height: 157.5cm. Height: 62in. Weight: Weight: 75kg. Weight: 165lb. Body mass index: BMI: 30.2kg/m^2. Body surface area:  BSA: 1.53m^2. Blood pressure: 143/56. Patient status: Inpatient. Location: Endoscopy.      11/09/15 Clinic EKG (performed and reviewed in clinic): NSR     Assessment and Plan   1. Valvular heart  disease  - history of mitral and aortic stenosis, s/p tissue valve replacement of both valves  - no recent symptoms, she is doing well - continue to monitor  2. Afib  - hx of prior cardioembolic CVA, has been continued on coumadin.  -CHADS2Vasc score is 3 - continue current meds  3. Hyperlipidemia - lipids not at goal, increase atorva to 40mg  daily.   4. Carotid stenosis - repeat carotid US   F/u 1 year     Arnoldo Lenis, M.D.

## 2017-05-22 NOTE — Patient Instructions (Signed)
Medication Instructions:  Your physician has recommended you make the following change in your medication:  Increase Atorvastatin to 40 mg Daily    Labwork: NONE  Testing/Procedures: Your physician has requested that you have a carotid duplex. This test is an ultrasound of the carotid arteries in your neck. It looks at blood flow through these arteries that supply the brain with blood. Allow one hour for this exam. There are no restrictions or special instructions.    Follow-Up: Your physician wants you to follow-up in: 1 Year with Dr. Harl Bowie . You will receive a reminder letter in the mail two months in advance. If you don't receive a letter, please call our office to schedule the follow-up appointment.   Any Other Special Instructions Will Be Listed Below (If Applicable).     If you need a refill on your cardiac medications before your next appointment, please call your pharmacy.

## 2017-05-29 ENCOUNTER — Ambulatory Visit (INDEPENDENT_AMBULATORY_CARE_PROVIDER_SITE_OTHER): Payer: Medicare Other | Admitting: *Deleted

## 2017-05-29 ENCOUNTER — Ambulatory Visit (HOSPITAL_COMMUNITY)
Admission: RE | Admit: 2017-05-29 | Discharge: 2017-05-29 | Disposition: A | Discharge status: Home / Self Care | Payer: Medicare Other | Source: Ambulatory Visit | Attending: Cardiology | Admitting: Cardiology

## 2017-05-29 DIAGNOSIS — Z5181 Encounter for therapeutic drug level monitoring: Secondary | ICD-10-CM

## 2017-05-29 DIAGNOSIS — I6523 Occlusion and stenosis of bilateral carotid arteries: Secondary | ICD-10-CM | POA: Diagnosis present

## 2017-05-29 DIAGNOSIS — I4891 Unspecified atrial fibrillation: Secondary | ICD-10-CM | POA: Diagnosis not present

## 2017-05-29 DIAGNOSIS — I639 Cerebral infarction, unspecified: Secondary | ICD-10-CM

## 2017-05-29 LAB — POCT INR: INR: 3.1

## 2017-06-26 ENCOUNTER — Ambulatory Visit (INDEPENDENT_AMBULATORY_CARE_PROVIDER_SITE_OTHER): Payer: Medicare Other | Admitting: *Deleted

## 2017-06-26 DIAGNOSIS — I639 Cerebral infarction, unspecified: Secondary | ICD-10-CM

## 2017-06-26 DIAGNOSIS — I4891 Unspecified atrial fibrillation: Secondary | ICD-10-CM | POA: Diagnosis not present

## 2017-06-26 DIAGNOSIS — Z5181 Encounter for therapeutic drug level monitoring: Secondary | ICD-10-CM

## 2017-06-26 LAB — POCT INR: INR: 3.8

## 2017-07-07 ENCOUNTER — Other Ambulatory Visit: Payer: Self-pay | Admitting: Cardiology

## 2017-07-19 ENCOUNTER — Ambulatory Visit (INDEPENDENT_AMBULATORY_CARE_PROVIDER_SITE_OTHER): Payer: Medicare Other | Admitting: *Deleted

## 2017-07-19 DIAGNOSIS — Z5181 Encounter for therapeutic drug level monitoring: Secondary | ICD-10-CM | POA: Diagnosis not present

## 2017-07-19 DIAGNOSIS — I639 Cerebral infarction, unspecified: Secondary | ICD-10-CM | POA: Diagnosis not present

## 2017-07-19 DIAGNOSIS — I4891 Unspecified atrial fibrillation: Secondary | ICD-10-CM | POA: Diagnosis not present

## 2017-07-19 LAB — POCT INR: INR: 2.9

## 2017-08-03 ENCOUNTER — Other Ambulatory Visit: Payer: Self-pay | Admitting: Cardiovascular Disease

## 2017-08-16 ENCOUNTER — Ambulatory Visit (INDEPENDENT_AMBULATORY_CARE_PROVIDER_SITE_OTHER): Payer: Medicare Other | Admitting: *Deleted

## 2017-08-16 DIAGNOSIS — Z5181 Encounter for therapeutic drug level monitoring: Secondary | ICD-10-CM

## 2017-08-16 DIAGNOSIS — I4891 Unspecified atrial fibrillation: Secondary | ICD-10-CM

## 2017-08-16 DIAGNOSIS — I639 Cerebral infarction, unspecified: Secondary | ICD-10-CM

## 2017-08-16 LAB — POCT INR: INR: 2.6

## 2017-09-14 ENCOUNTER — Ambulatory Visit (INDEPENDENT_AMBULATORY_CARE_PROVIDER_SITE_OTHER): Payer: Medicare Other | Admitting: *Deleted

## 2017-09-14 DIAGNOSIS — I639 Cerebral infarction, unspecified: Secondary | ICD-10-CM

## 2017-09-14 DIAGNOSIS — I4891 Unspecified atrial fibrillation: Secondary | ICD-10-CM | POA: Diagnosis not present

## 2017-09-14 DIAGNOSIS — Z5181 Encounter for therapeutic drug level monitoring: Secondary | ICD-10-CM

## 2017-09-14 LAB — POCT INR: INR: 2.1

## 2017-09-29 ENCOUNTER — Ambulatory Visit (INDEPENDENT_AMBULATORY_CARE_PROVIDER_SITE_OTHER): Payer: Medicare Other | Admitting: *Deleted

## 2017-09-29 DIAGNOSIS — I4891 Unspecified atrial fibrillation: Secondary | ICD-10-CM | POA: Diagnosis not present

## 2017-09-29 DIAGNOSIS — I639 Cerebral infarction, unspecified: Secondary | ICD-10-CM | POA: Diagnosis not present

## 2017-09-29 DIAGNOSIS — Z5181 Encounter for therapeutic drug level monitoring: Secondary | ICD-10-CM | POA: Diagnosis not present

## 2017-09-29 LAB — POCT INR: INR: 2.7

## 2017-10-25 ENCOUNTER — Ambulatory Visit (INDEPENDENT_AMBULATORY_CARE_PROVIDER_SITE_OTHER): Payer: Medicare Other | Admitting: *Deleted

## 2017-10-25 DIAGNOSIS — I639 Cerebral infarction, unspecified: Secondary | ICD-10-CM | POA: Diagnosis not present

## 2017-10-25 DIAGNOSIS — I4891 Unspecified atrial fibrillation: Secondary | ICD-10-CM | POA: Diagnosis not present

## 2017-10-25 DIAGNOSIS — Z5181 Encounter for therapeutic drug level monitoring: Secondary | ICD-10-CM | POA: Diagnosis not present

## 2017-10-25 LAB — POCT INR: INR: 3.1

## 2017-10-25 NOTE — Patient Instructions (Signed)
Continue coumadin 1 1/2 tablets daily except 2 tablets on Thursdays.  Recheck in 4 weeks

## 2017-11-06 ENCOUNTER — Other Ambulatory Visit (HOSPITAL_COMMUNITY): Payer: Self-pay | Admitting: Internal Medicine

## 2017-11-06 DIAGNOSIS — Z1231 Encounter for screening mammogram for malignant neoplasm of breast: Secondary | ICD-10-CM

## 2017-11-22 ENCOUNTER — Ambulatory Visit (INDEPENDENT_AMBULATORY_CARE_PROVIDER_SITE_OTHER): Payer: Medicare Other | Admitting: *Deleted

## 2017-11-22 DIAGNOSIS — I4891 Unspecified atrial fibrillation: Secondary | ICD-10-CM | POA: Diagnosis not present

## 2017-11-22 DIAGNOSIS — I639 Cerebral infarction, unspecified: Secondary | ICD-10-CM | POA: Diagnosis not present

## 2017-11-22 DIAGNOSIS — Z5181 Encounter for therapeutic drug level monitoring: Secondary | ICD-10-CM

## 2017-11-22 LAB — POCT INR: INR: 3.5

## 2017-11-22 NOTE — Patient Instructions (Signed)
Continue coumadin 1 1/2 tablets daily except 2 tablets on Thursdays.  Recheck in 4 weeks

## 2017-12-04 ENCOUNTER — Encounter (HOSPITAL_COMMUNITY): Payer: Self-pay

## 2017-12-04 ENCOUNTER — Ambulatory Visit (HOSPITAL_COMMUNITY)
Admission: RE | Admit: 2017-12-04 | Discharge: 2017-12-04 | Disposition: A | Discharge status: Home / Self Care | Payer: Medicare Other | Source: Ambulatory Visit | Attending: Internal Medicine | Admitting: Internal Medicine

## 2017-12-04 DIAGNOSIS — Z1231 Encounter for screening mammogram for malignant neoplasm of breast: Secondary | ICD-10-CM | POA: Insufficient documentation

## 2017-12-26 ENCOUNTER — Other Ambulatory Visit (HOSPITAL_COMMUNITY): Payer: Self-pay | Admitting: Internal Medicine

## 2017-12-26 DIAGNOSIS — Z780 Asymptomatic menopausal state: Secondary | ICD-10-CM

## 2017-12-28 ENCOUNTER — Ambulatory Visit (INDEPENDENT_AMBULATORY_CARE_PROVIDER_SITE_OTHER): Payer: Medicare Other | Admitting: *Deleted

## 2017-12-28 DIAGNOSIS — Z5181 Encounter for therapeutic drug level monitoring: Secondary | ICD-10-CM

## 2017-12-28 DIAGNOSIS — I639 Cerebral infarction, unspecified: Secondary | ICD-10-CM

## 2017-12-28 DIAGNOSIS — I4891 Unspecified atrial fibrillation: Secondary | ICD-10-CM | POA: Diagnosis not present

## 2017-12-28 LAB — POCT INR: INR: 3.1

## 2017-12-28 NOTE — Patient Instructions (Signed)
Continue coumadin 1 1/2 tablets daily except 2 tablets on Thursdays.  Recheck in 6 weeks

## 2018-01-10 ENCOUNTER — Other Ambulatory Visit (HOSPITAL_COMMUNITY): Payer: Self-pay | Admitting: Pulmonary Disease

## 2018-01-10 DIAGNOSIS — M545 Low back pain: Secondary | ICD-10-CM

## 2018-01-10 DIAGNOSIS — M7130 Other bursal cyst, unspecified site: Secondary | ICD-10-CM

## 2018-01-16 ENCOUNTER — Ambulatory Visit (HOSPITAL_COMMUNITY)
Admission: RE | Admit: 2018-01-16 | Discharge: 2018-01-16 | Disposition: A | Discharge status: Home / Self Care | Payer: Medicare Other | Source: Ambulatory Visit | Attending: Pulmonary Disease | Admitting: Pulmonary Disease

## 2018-01-16 DIAGNOSIS — M5136 Other intervertebral disc degeneration, lumbar region: Secondary | ICD-10-CM | POA: Insufficient documentation

## 2018-01-16 DIAGNOSIS — R609 Edema, unspecified: Secondary | ICD-10-CM | POA: Insufficient documentation

## 2018-01-16 DIAGNOSIS — M5126 Other intervertebral disc displacement, lumbar region: Secondary | ICD-10-CM | POA: Insufficient documentation

## 2018-01-16 DIAGNOSIS — Z9889 Other specified postprocedural states: Secondary | ICD-10-CM | POA: Diagnosis not present

## 2018-01-16 DIAGNOSIS — M8938 Hypertrophy of bone, other site: Secondary | ICD-10-CM | POA: Insufficient documentation

## 2018-01-16 DIAGNOSIS — M7130 Other bursal cyst, unspecified site: Secondary | ICD-10-CM | POA: Insufficient documentation

## 2018-01-16 DIAGNOSIS — M545 Low back pain: Secondary | ICD-10-CM | POA: Insufficient documentation

## 2018-01-19 ENCOUNTER — Ambulatory Visit (HOSPITAL_COMMUNITY)
Admission: RE | Admit: 2018-01-19 | Discharge: 2018-01-19 | Disposition: A | Discharge status: Home / Self Care | Payer: Medicare Other | Source: Ambulatory Visit | Attending: Internal Medicine | Admitting: Internal Medicine

## 2018-01-19 DIAGNOSIS — Z780 Asymptomatic menopausal state: Secondary | ICD-10-CM

## 2018-01-19 DIAGNOSIS — M8589 Other specified disorders of bone density and structure, multiple sites: Secondary | ICD-10-CM | POA: Insufficient documentation

## 2018-01-24 ENCOUNTER — Other Ambulatory Visit: Payer: Self-pay | Admitting: Cardiology

## 2018-02-08 ENCOUNTER — Ambulatory Visit (INDEPENDENT_AMBULATORY_CARE_PROVIDER_SITE_OTHER): Payer: Medicare Other | Admitting: *Deleted

## 2018-02-08 DIAGNOSIS — I4891 Unspecified atrial fibrillation: Secondary | ICD-10-CM

## 2018-02-08 DIAGNOSIS — I639 Cerebral infarction, unspecified: Secondary | ICD-10-CM

## 2018-02-08 DIAGNOSIS — Z5181 Encounter for therapeutic drug level monitoring: Secondary | ICD-10-CM

## 2018-02-08 LAB — POCT INR: INR: 2.4

## 2018-02-08 NOTE — Patient Instructions (Signed)
Take 2 tablets tonight and tomorrow night then resume 1 1/2 tablets daily except 2 tablets on Thursdays.  Recheck in 6 weeks

## 2018-03-22 ENCOUNTER — Ambulatory Visit (INDEPENDENT_AMBULATORY_CARE_PROVIDER_SITE_OTHER): Payer: Medicare Other | Admitting: *Deleted

## 2018-03-22 DIAGNOSIS — I639 Cerebral infarction, unspecified: Secondary | ICD-10-CM | POA: Diagnosis not present

## 2018-03-22 DIAGNOSIS — Z5181 Encounter for therapeutic drug level monitoring: Secondary | ICD-10-CM | POA: Diagnosis not present

## 2018-03-22 DIAGNOSIS — I4891 Unspecified atrial fibrillation: Secondary | ICD-10-CM

## 2018-03-22 LAB — POCT INR: INR: 2.5 (ref 2.0–3.0)

## 2018-03-22 NOTE — Patient Instructions (Signed)
Increase coumadin to 1 1/2 tablets daily except 2 tablets on Mondays and Thursdays.  Recheck in 6 weeks

## 2018-05-02 ENCOUNTER — Other Ambulatory Visit: Payer: Self-pay | Admitting: Cardiology

## 2018-05-03 ENCOUNTER — Other Ambulatory Visit (HOSPITAL_COMMUNITY)
Admission: RE | Admit: 2018-05-03 | Discharge: 2018-05-03 | Disposition: A | Discharge status: Home / Self Care | Payer: Medicare Other | Source: Ambulatory Visit | Attending: Cardiology | Admitting: Cardiology

## 2018-05-03 ENCOUNTER — Ambulatory Visit (INDEPENDENT_AMBULATORY_CARE_PROVIDER_SITE_OTHER): Payer: Medicare Other | Admitting: *Deleted

## 2018-05-03 DIAGNOSIS — Z952 Presence of prosthetic heart valve: Secondary | ICD-10-CM | POA: Diagnosis not present

## 2018-05-03 DIAGNOSIS — Z5181 Encounter for therapeutic drug level monitoring: Secondary | ICD-10-CM | POA: Insufficient documentation

## 2018-05-03 DIAGNOSIS — I639 Cerebral infarction, unspecified: Secondary | ICD-10-CM | POA: Diagnosis present

## 2018-05-03 DIAGNOSIS — I050 Rheumatic mitral stenosis: Secondary | ICD-10-CM

## 2018-05-03 DIAGNOSIS — I4891 Unspecified atrial fibrillation: Secondary | ICD-10-CM

## 2018-05-03 LAB — PROTIME-INR
INR: 9.29
Prothrombin Time: 74.7 seconds — ABNORMAL HIGH (ref 11.4–15.2)

## 2018-05-03 NOTE — Patient Instructions (Signed)
POC INR >8.0   Pt sent to [facility] for STAT PT/INR = 9.29 Hold coumadin x 4 days (Th,Fri,Sat,Sun) and recheck INR on Monday No obvious reason why INR is elevated.  Pt used 1 monistat vaginal application 1 week ago but that is all. Pt denies any signs of bleeding.  Bleeding and fall precautions discussed with patient and spouse.  They were told to go the ED if pt develops bleeding or has a fall.  They verbalized understanding.

## 2018-05-07 ENCOUNTER — Ambulatory Visit (INDEPENDENT_AMBULATORY_CARE_PROVIDER_SITE_OTHER): Payer: Medicare Other | Admitting: Pharmacist

## 2018-05-07 DIAGNOSIS — Z5181 Encounter for therapeutic drug level monitoring: Secondary | ICD-10-CM | POA: Diagnosis not present

## 2018-05-07 DIAGNOSIS — I639 Cerebral infarction, unspecified: Secondary | ICD-10-CM | POA: Diagnosis not present

## 2018-05-07 DIAGNOSIS — I4891 Unspecified atrial fibrillation: Secondary | ICD-10-CM

## 2018-05-07 LAB — POCT INR: INR: 4.7 — AB (ref 2.0–3.0)

## 2018-05-07 NOTE — Patient Instructions (Signed)
Description   No warfarin today or tomorrow then resume with 1.5 tablets daily. Recheck INR on Monday.  No obvious reason why INR is elevated.  Pt used 1 monistat vaginal application 1 week ago but that is all.

## 2018-05-14 ENCOUNTER — Ambulatory Visit (INDEPENDENT_AMBULATORY_CARE_PROVIDER_SITE_OTHER): Payer: Medicare Other | Admitting: *Deleted

## 2018-05-14 DIAGNOSIS — I4891 Unspecified atrial fibrillation: Secondary | ICD-10-CM

## 2018-05-14 DIAGNOSIS — Z5181 Encounter for therapeutic drug level monitoring: Secondary | ICD-10-CM

## 2018-05-14 DIAGNOSIS — I639 Cerebral infarction, unspecified: Secondary | ICD-10-CM | POA: Diagnosis not present

## 2018-05-14 DIAGNOSIS — Z952 Presence of prosthetic heart valve: Secondary | ICD-10-CM | POA: Diagnosis not present

## 2018-05-14 LAB — POCT INR: INR: 3.6 — AB (ref 2.0–3.0)

## 2018-05-14 NOTE — Patient Instructions (Signed)
Take coumadin 1 tablet tonight then resume 1.5 tablets daily. Recheck INR on 8/23

## 2018-05-25 ENCOUNTER — Ambulatory Visit (INDEPENDENT_AMBULATORY_CARE_PROVIDER_SITE_OTHER): Payer: Medicare Other | Admitting: *Deleted

## 2018-05-25 ENCOUNTER — Other Ambulatory Visit (HOSPITAL_COMMUNITY)
Admission: RE | Admit: 2018-05-25 | Discharge: 2018-05-25 | Disposition: A | Discharge status: Home / Self Care | Payer: Medicare Other | Source: Ambulatory Visit | Attending: Cardiology | Admitting: Cardiology

## 2018-05-25 DIAGNOSIS — Z5181 Encounter for therapeutic drug level monitoring: Secondary | ICD-10-CM | POA: Diagnosis present

## 2018-05-25 DIAGNOSIS — I4891 Unspecified atrial fibrillation: Secondary | ICD-10-CM | POA: Insufficient documentation

## 2018-05-25 DIAGNOSIS — I639 Cerebral infarction, unspecified: Secondary | ICD-10-CM | POA: Diagnosis not present

## 2018-05-25 LAB — PROTIME-INR
INR: 5.03 — AB
PROTHROMBIN TIME: 46.3 s — AB (ref 11.4–15.2)

## 2018-05-25 LAB — POCT INR: INR: 6.7 — AB (ref 2.0–3.0)

## 2018-05-29 ENCOUNTER — Ambulatory Visit: Payer: Medicare Other | Admitting: Cardiology

## 2018-05-29 ENCOUNTER — Encounter: Payer: Self-pay | Admitting: Cardiology

## 2018-05-29 VITALS — BP 100/58 | HR 94 | Ht 62.0 in | Wt 177.0 lb

## 2018-05-29 DIAGNOSIS — E782 Mixed hyperlipidemia: Secondary | ICD-10-CM | POA: Diagnosis not present

## 2018-05-29 DIAGNOSIS — R0602 Shortness of breath: Secondary | ICD-10-CM | POA: Diagnosis not present

## 2018-05-29 DIAGNOSIS — I38 Endocarditis, valve unspecified: Secondary | ICD-10-CM

## 2018-05-29 DIAGNOSIS — I4891 Unspecified atrial fibrillation: Secondary | ICD-10-CM

## 2018-05-29 MED ORDER — OLMESARTAN MEDOXOMIL 20 MG PO TABS: 10.0000 mg | ORAL_TABLET | Freq: Every day | ORAL | 3 refills | Status: DC

## 2018-05-29 NOTE — Progress Notes (Signed)
Clinical Summary [name] is a 72 y.o.female seen today for follow up of the following medical problems.   1. Valvular heart disease  - 07/18/13 patient underwent MVR with 25 mm Edwards Magna-ease pericardial valve and also AVR with 21 mm Edwards Magna-ease pericardial valve     - some recent SOB/DOE. Mainly with activities, worst in heat. DOE with walking dog up 3/4 mile, used to walk a mile without troubles. When playing golf sometimes sits out holes due to her SOB. Occasional LE edema - no chest pain. +cough nonproductive for several years. No wheezing.  - PFTs in 2014 did show moderate obstruction  2. Afib  - hx of cardioembolic CVA, has been on longterm anticoag  - no recent palpitations.  - no bleeding on coumadin  3. Hyperlipidemia - compliant with statin - most recent labs with pcp   4. Carotid stenosis - 07/2013 [place] with mild bilateral stenosis  5. Abnormal PFTs - moderate obstruction by 2014 study  6. Dizziness - mainly with standing up. - drinks decaf tea x 3 large glasses, water bottles x2  SH: daughter with Crohns with disease, they spend a lot of time supporting her.    Past Medical History:  Diagnosis Date  . Anemia   . Atrial fibrillation ([place])   . Breast carcinoma ([place]) 1995   1995  . Cardioembolic stroke ([place]) 06/5283   Right frontal in 01/2012; normal carotid ultrasound; possible LAA thrombus by TEE; virtual complete neurologic recovery  . Chronic kidney disease, stage 2, mildly decreased GFR    GFR of approximately 60  . Depression   . Diverticulosis of colon (without mention of hemorrhage) 2012   Dr. Laural Golden  . Fasting hyperglycemia    120 fasting  . Gastroesophageal reflux disease   . Gastroparesis   . Hemorrhoids   . Hyperlipidemia   . Hypertension    pt denies 05/30/13     Dr Kathe Mariner  . Hyponatremia   . Rheumatic heart disease    a. 07/2013 Echo: Ef 55-60%, Mod AS/AI, Mod-Sev MS, sev dil LA, PASP 58;  b.  07/2013 TEE: EF 35-40%, mild-mod AS/AI, mod-sev MS, LA smoke;  c. 07/2013 Cath: elev R heart pressures, nl cors.  . Shortness of breath      Allergies  Allergen Reactions  . Amiodarone Rash     Current Outpatient Medications  Medication Sig Dispense Refill  . albuterol (VENTOLIN HFA) 108 (90 BASE) MCG/ACT inhaler Inhale 2 puffs into the lungs every 4 (four) hours as needed for wheezing or shortness of breath.     Marland Kitchen atorvastatin (LIPITOR) 40 MG tablet Take 1 tablet (40 mg total) by mouth daily. 90 tablet 3  . diltiazem (CARDIZEM CD) 180 MG 24 hr capsule Take 1 capsule (180 mg total) by mouth daily. 30 capsule 1  . escitalopram (LEXAPRO) 10 MG tablet Take 10 mg by mouth daily.     . fexofenadine (ALLEGRA) 30 MG tablet Take 30 mg by mouth daily.     . fluticasone (FLONASE) 50 MCG/ACT nasal spray Place 1 spray into both nostrils daily as needed.     . furosemide (LASIX) 40 MG tablet TAKE ONE TABLET BY MOUTH ONCE DAILY.MAY TAKE AN ADDITIONAL TABLET AT BEDTIME AS NEEDED FOR SWELLING. 180 tablet 0  . levothyroxine (SYNTHROID, LEVOTHROID) 112 MCG tablet Take 1 tablet by mouth daily. Pt taking 100 mcg.    . losartan (COZAAR) 50 MG tablet Take 50 mg by mouth daily.    Marland Kitchen  metFORMIN (GLUCOPHAGE) 500 MG tablet Take 1 tablet (500 mg total) by mouth 2 (two) times daily with a meal. 60 tablet 1  . metoprolol tartrate (LOPRESSOR) 25 MG tablet TAKE (1) TABLET BY MOUTH TWICE DAILY. 180 tablet 0  . Multiple Vitamin (MULITIVITAMIN WITH MINERALS) TABS Take 1 tablet by mouth daily.    . potassium chloride SA (K-DUR,KLOR-CON) 20 MEQ tablet Take 20 mEq by mouth daily.    Marland Kitchen QVAR 80 MCG/ACT inhaler     . saccharomyces boulardii (FLORASTOR) 250 MG capsule Take 250 mg by mouth daily.    Marland Kitchen warfarin (COUMADIN) 2.5 MG tablet TAKE 1 AND 1/2 TABLETS DAILY EXCEPT TAKE 2 TABLETS ON TUESDAYS, THURSDAYS AND SATURDAYS AS DIRECTED PER COUMADIN CLINIC. 60 tablet 0   No current facility-administered medications for this visit.        Past Surgical History:  Procedure Laterality Date  . AORTIC VALVE REPLACEMENT N/A 07/18/2013   Procedure: AORTIC VALVE REPLACEMENT (AVR);  Surgeon: Gaye Pollack, MD;  Location: [facility];  Service: Open Heart Surgery;  Laterality: N/A;  . BACK SURGERY    . BREAST LUMPECTOMY Right 1995  . CARDIAC CATHETERIZATION    . CARDIOVERSION N/A 07/26/2013   Procedure: CARDIOVERSION;  Surgeon: Fay Records, MD;  Location: [facility] ENDOSCOPY;  Service: Cardiovascular;  Laterality: N/A;  . COLONOSCOPY  2012   Negative screening procedure  . DILATION AND CURETTAGE OF UTERUS    . ESOPHAGEAL MANOMETRY N/A 06/17/2013   Procedure: ESOPHAGEAL MANOMETRY (EM);  Surgeon: Sable Feil, MD;  Location: [facility] ENDOSCOPY;  Service: Endoscopy;  Laterality: N/A;  . INTRAOPERATIVE TRANSESOPHAGEAL ECHOCARDIOGRAM N/A 07/18/2013   Procedure: INTRAOPERATIVE TRANSESOPHAGEAL ECHOCARDIOGRAM;  Surgeon: Gaye Pollack, MD;  Location: [place] OR;  Service: Open Heart Surgery;  Laterality: N/A;  . LEFT AND RIGHT HEART CATHETERIZATION WITH CORONARY ANGIOGRAM N/A 07/10/2013   Procedure: LEFT AND RIGHT HEART CATHETERIZATION WITH CORONARY ANGIOGRAM;  Surgeon: Burnell Blanks, MD;  Location: [facility] CATH LAB;  Service: Cardiovascular;  Laterality: N/A;  . LUMBAR LAMINECTOMY/DECOMPRESSION MICRODISCECTOMY Right 05/13/2014   Procedure: LUMBAR LAMINECTOMY/DECOMPRESSION MICRODISCECTOMY 1 LEVEL  lumbar four/five;  Surgeon: Faythe Ghee, MD;  Location: [facility] NEURO ORS;  Service: Neurosurgery;  Laterality: Right;  . MITRAL VALVE REPLACEMENT N/A 07/18/2013   Procedure: MITRAL VALVE (MV) REPLACEMENT;  Surgeon: Gaye Pollack, MD;  Location: [place] OR;  Service: Open Heart Surgery;  Laterality: N/A;  . MITRAL VALVE [place], closed mitral valvulotomy by finger fracture  . TEE WITHOUT CARDIOVERSION  01/24/2012   Procedure: TRANSESOPHAGEAL ECHOCARDIOGRAM (TEE);  Surgeon: Lelon Perla, MD;  Location[facility] ENDOSCOPY;  Service: Cardiovascular;   Laterality: N/A;  . TEE WITHOUT CARDIOVERSION N/A 07/11/2013   Procedure: TRANSESOPHAGEAL ECHOCARDIOGRAM (TEE);  Surgeon: Thayer Headings, MD;  Location: [place];  Service: Cardiovascular;  Laterality: N/A;  . TEE WITHOUT CARDIOVERSION N/A 07/26/2013   Procedure: TRANSESOPHAGEAL ECHOCARDIOGRAM (TEE);  Surgeon: Fay Records, MD;  Location: [place];  Service: Cardiovascular;  Laterality: N/A;  . TUBAL LIGATION  1973     Allergies  Allergen Reactions  . Amiodarone Rash      Family History  Problem Relation Age of Onset  . Heart disease Brother 50       MI  . Rheum arthritis Maternal Grandmother   . Asthma Maternal Grandfather   . Hypothyroidism Mother   . Diabetes Sister   . Cirrhosis Father   . Cervical cancer Sister  malignant neoplasm of cervix uteri     Social History [name] reports that she has never smoked. She has never used smokeless tobacco. [name] reports that she does not drink alcohol.   Review of Systems CONSTITUTIONAL: No weight loss, fever, chills, weakness or fatigue.  HEENT: Eyes: No visual loss, blurred vision, double vision or yellow sclerae.No hearing loss, sneezing, congestion, runny nose or sore throat.  SKIN: No rash or itching.  CARDIOVASCULAR: per hpi RESPIRATORY:per hpi GASTROINTESTINAL: No anorexia, nausea, vomiting or diarrhea. No abdominal pain or blood.  GENITOURINARY: No burning on urination, no polyuria NEUROLOGICAL: +dizziness MUSCULOSKELETAL: No muscle, back pain, joint pain or stiffness.  LYMPHATICS: No enlarged nodes. No history of splenectomy.  PSYCHIATRIC: No history of depression or anxiety.  ENDOCRINOLOGIC: No reports of sweating, cold or heat intolerance. No polyuria or polydipsia.  Marland Kitchen   Physical Examination Vitals:   05/29/18 0817  BP: (!) 100/58  Pulse: 94  SpO2: 97%   Vitals:   05/29/18 0817  Weight: 177 lb (80.3 kg)  Height: 5\' 2"  (1.575 m)    Gen: resting comfortably, no acute  distress HEENT: no scleral icterus, pupils equal round and reactive, no palptable cervical adenopathy,  CV: irreg, no m/r/g, no jvd Resp: Clear to auscultation bilaterally GI: abdomen is soft, non-tender, non-distended, normal bowel sounds, no hepatosplenomegaly MSK: extremities are warm, no edema.  Skin: warm, no rash Neuro:  no focal deficits Psych: appropriate affect   Diagnostic Studies 07/2013 TTE Study Conclusions  - Study data: Technically difficult study. - Left ventricle: The cavity size was normal. Wall thickness was normal. Systolic function was normal. The estimated ejection fraction was in the range of 55% to 60%. Indeterminate diastolic function. There is evidence of very high left atrial pressures (E/e' 63) - Aortic valve: Trileaflet; mildly thickened leaflets. Accurate evaluation of aortic stenosis limited in the setting of severe mitral stenosis and decreased ventricular loadin. Morphologically there does not appear to be significant stenosis. By gradient (mean PG 21 mmHg) the stenosis is moderate, by AVA VTI (0.8) stenosis is severe. Moderate regurgitation, AR PHT is 439 cm/s. Valve area: 0.78cm^2(VTI). Valve area: 0.78cm^2 (Vmax). - Mitral valve: The findings are consistent with moderate to severe stenosis. (Mean PG 12 mmHg, MVA by PHT 1.3. Cannot use MVA by VTI due to moderate AI. Valve area by pressure half-time: 1.38cm^2. - Left atrium: The atrium was severely dilated. - Right ventricle: Systolic function was mildly reduced, RV TAPSE is 1.4, TV systolic annular tissue velocity 10 cm/s. - Pulmonary arteries: Systolic pressure was moderately increased. PA peak pressure: 70mm Hg (S) (PASP based on a fairly faint spectral Doppler waveform)   07/2013 TEE Study Conclusions  - Left ventricle: Systolic function was moderately reduced. The estimated ejection fraction was in the range of 35% to 40%. - Aortic valve:  There was mild to moderate stenosis. Mild to moderate regurgitation. - Mitral valve: The findings are consistent with moderate to severe stenosis. Valve area by pressure half-time: 1.45cm^2. - Left atrium: There was spontaneous echo contrast ("smoke"). - Atrial septum: No defect or patent foramen ovale was identified. - Tricuspid valve: No evidence of vegetation. Transesophageal echocardiography. 2D and color Doppler. Height: Height: 157.5cm. Height: 62in. Weight: Weight: 75kg. Weight: 165lb. Body mass index: BMI: 30.2kg/m^2. Body surface area:  BSA: 1.68m^2. Blood pressure: 143/56. Patient status: Inpatient. Location: Endoscopy.      11/09/15 Clinic EKG (performed and reviewed in clinic): NSR       Assessment and Plan  1. Valvular heart disease  - history of mitral and aortic stenosis, s/p tissue valve replacement of both valves  - recent SOb, we will repeat echo  2. SOB - repeat echo given her history of valvular disease - note PFTs in 2014 moderate obstruction, if negative cardiac workup consider pulmonary etiology.   3. Afib  - hx of prior cardioembolic CVA, has been continued on coumadin.  -no recent symptoms - cnotinue current meds  4. Hyperlipidemia - continue statin, request labs from pcp  5. Dizziness - orthostatic symptoms. She endorses adequate hydration. Lower benicar to 10mg  daily and follow  F/u 3 months      Arnoldo Lenis, M.D.

## 2018-05-29 NOTE — Patient Instructions (Signed)
Medication Instructions:  DECREASE BENICAR TO 10 MG DAILY   Labwork:  I WILL REQUEST LABS FROM PCP  Testing/Procedures: Your physician has requested that you have an echocardiogram. Echocardiography is a painless test that uses sound waves to create images of your heart. It provides your doctor with information about the size and shape of your heart and how well your heart's chambers and valves are working. This procedure takes approximately one hour. There are no restrictions for this procedure.    Follow-Up: Your physician recommends that you schedule a follow-up appointment in: 3 MONTHS    Any Other Special Instructions Will Be Listed Below (If Applicable).     If you need a refill on your cardiac medications before your next appointment, please call your pharmacy.

## 2018-06-08 ENCOUNTER — Ambulatory Visit (INDEPENDENT_AMBULATORY_CARE_PROVIDER_SITE_OTHER): Payer: Medicare Other | Admitting: *Deleted

## 2018-06-08 ENCOUNTER — Ambulatory Visit (HOSPITAL_COMMUNITY)
Admission: RE | Admit: 2018-06-08 | Discharge: 2018-06-08 | Disposition: A | Discharge status: Home / Self Care | Payer: Medicare Other | Source: Ambulatory Visit | Attending: Cardiology | Admitting: Cardiology

## 2018-06-08 DIAGNOSIS — Z5181 Encounter for therapeutic drug level monitoring: Secondary | ICD-10-CM | POA: Diagnosis not present

## 2018-06-08 DIAGNOSIS — I639 Cerebral infarction, unspecified: Secondary | ICD-10-CM

## 2018-06-08 DIAGNOSIS — I4891 Unspecified atrial fibrillation: Secondary | ICD-10-CM

## 2018-06-08 DIAGNOSIS — R0602 Shortness of breath: Secondary | ICD-10-CM

## 2018-06-08 LAB — POCT INR: INR: 3.6 — AB (ref 2.0–3.0)

## 2018-06-08 NOTE — Patient Instructions (Signed)
Decrease coumadin to 1.5 tablets everyday except 1 tablet on Mondays, Wednesdays and Fridays. Recheck in 2 weeks.

## 2018-06-08 NOTE — Progress Notes (Signed)
*  PRELIMINARY RESULTS* Echocardiogram 2D Echocardiogram has been performed.  [name] 06/08/2018, 3:09 PM

## 2018-06-11 ENCOUNTER — Telehealth: Payer: Self-pay

## 2018-06-11 DIAGNOSIS — R0602 Shortness of breath: Secondary | ICD-10-CM

## 2018-06-11 NOTE — Telephone Encounter (Signed)
-----   Message from Arnoldo Lenis, MD sent at 06/11/2018  1:10 PM EDT ----- Echo overall looks good, no major changes to heart function or her valves. Nothing to explain her symptoms. I would suggest follow up with pulmonary. She had been to [facility] in [place] if she wants to see them, if not can refer to Dr Luan Pulling locally   Zandra Abts MD

## 2018-06-11 NOTE — Telephone Encounter (Signed)
Pt made aware. Referral put in for Dr. Luan Pulling.

## 2018-06-11 NOTE — Telephone Encounter (Signed)
-----   Message from Arnoldo Lenis, MD sent at 06/11/2018  1:10 PM EDT ----- Echo overall looks good, no major changes to heart function or her valves. Nothing to explain her symptoms. I would suggest follow up with pulmonary. She had been to [facility] in [place] if she wants to see them, if not can refer to Dr Luan Pulling locally   Zandra Abts MD

## 2018-06-11 NOTE — Telephone Encounter (Signed)
Called pt. Left message with her husband for pt te return call.

## 2018-06-13 ENCOUNTER — Other Ambulatory Visit: Payer: Self-pay | Admitting: Cardiology

## 2018-06-20 ENCOUNTER — Other Ambulatory Visit: Payer: Self-pay | Admitting: Cardiology

## 2018-06-20 ENCOUNTER — Ambulatory Visit (INDEPENDENT_AMBULATORY_CARE_PROVIDER_SITE_OTHER): Payer: Medicare Other | Admitting: *Deleted

## 2018-06-20 DIAGNOSIS — I4891 Unspecified atrial fibrillation: Secondary | ICD-10-CM | POA: Diagnosis not present

## 2018-06-20 DIAGNOSIS — I639 Cerebral infarction, unspecified: Secondary | ICD-10-CM

## 2018-06-20 DIAGNOSIS — Z5181 Encounter for therapeutic drug level monitoring: Secondary | ICD-10-CM | POA: Diagnosis not present

## 2018-06-20 LAB — POCT INR: INR: 2.6 (ref 2.0–3.0)

## 2018-06-20 NOTE — Patient Instructions (Signed)
Continue coumadin 1.5 tablets everyday except 1 tablet on Mondays, Wednesdays and Fridays. Recheck in 3 weeks.

## 2018-07-11 ENCOUNTER — Ambulatory Visit (INDEPENDENT_AMBULATORY_CARE_PROVIDER_SITE_OTHER): Payer: Medicare Other | Admitting: *Deleted

## 2018-07-11 DIAGNOSIS — Z5181 Encounter for therapeutic drug level monitoring: Secondary | ICD-10-CM

## 2018-07-11 DIAGNOSIS — I4891 Unspecified atrial fibrillation: Secondary | ICD-10-CM | POA: Diagnosis not present

## 2018-07-11 DIAGNOSIS — I639 Cerebral infarction, unspecified: Secondary | ICD-10-CM

## 2018-07-11 LAB — POCT INR: INR: 2.2 (ref 2.0–3.0)

## 2018-07-11 NOTE — Patient Instructions (Signed)
Take coumadin 1 1/2 tablets tonight then increase dose to 1 1/2 tablets daily except 1 tablet on Tuesdays and Fridays Recheck in 2 weeks

## 2018-07-25 ENCOUNTER — Other Ambulatory Visit: Payer: Self-pay | Admitting: Cardiology

## 2018-07-25 ENCOUNTER — Ambulatory Visit (INDEPENDENT_AMBULATORY_CARE_PROVIDER_SITE_OTHER): Payer: Medicare Other | Admitting: *Deleted

## 2018-07-25 DIAGNOSIS — I4891 Unspecified atrial fibrillation: Secondary | ICD-10-CM | POA: Diagnosis not present

## 2018-07-25 DIAGNOSIS — Z5181 Encounter for therapeutic drug level monitoring: Secondary | ICD-10-CM | POA: Diagnosis not present

## 2018-07-25 DIAGNOSIS — I639 Cerebral infarction, unspecified: Secondary | ICD-10-CM

## 2018-07-25 LAB — POCT INR: INR: 3.5 — AB (ref 2.0–3.0)

## 2018-07-25 NOTE — Patient Instructions (Signed)
Take coumadin 1 tablet tonight then resume 1 1/2 tablets daily except 1 tablet on Tuesdays and Fridays Recheck in 1 week

## 2018-08-01 ENCOUNTER — Ambulatory Visit (INDEPENDENT_AMBULATORY_CARE_PROVIDER_SITE_OTHER): Payer: Medicare Other | Admitting: *Deleted

## 2018-08-01 DIAGNOSIS — I639 Cerebral infarction, unspecified: Secondary | ICD-10-CM

## 2018-08-01 DIAGNOSIS — Z5181 Encounter for therapeutic drug level monitoring: Secondary | ICD-10-CM | POA: Diagnosis not present

## 2018-08-01 DIAGNOSIS — I4891 Unspecified atrial fibrillation: Secondary | ICD-10-CM | POA: Diagnosis not present

## 2018-08-01 LAB — POCT INR: INR: 2.5 (ref 2.0–3.0)

## 2018-08-01 NOTE — Patient Instructions (Signed)
Continue coumadin 1 1/2 tablets daily except 1 tablet on Tuesdays and Fridays Recheck in 4 weeks

## 2018-08-23 ENCOUNTER — Telehealth: Payer: Self-pay | Admitting: *Deleted

## 2018-08-23 ENCOUNTER — Telehealth: Payer: Self-pay | Admitting: Cardiology

## 2018-08-23 NOTE — Telephone Encounter (Signed)
Patient was on antibiotic for yeast infection. Husband states he needs to know what to do with Coumadin./ tg

## 2018-08-23 NOTE — Telephone Encounter (Signed)
Pt had to take 1 tablet of diflucan for yeast infection today.   Told spouse to have her take 5mg  of coumadin tonight instead of 7.5mg  and resume regular dose tomorrow.  Has INR appt scheduled for 11/27.  He verbalized understanding.

## 2018-08-23 NOTE — Telephone Encounter (Signed)
Calling to report patient weight gain of 5.4lb over 26 days/patient's weight is 178 lbs/tg

## 2018-08-23 NOTE — Telephone Encounter (Signed)
Spoke with pt who denies any SOB/dizziness/swelling - says her weight is 178lbs (was 177lbs at [place]) pt denies any other symptoms - confirmed appt for 12/13 with Dr Harl Bowie

## 2018-08-29 ENCOUNTER — Ambulatory Visit (INDEPENDENT_AMBULATORY_CARE_PROVIDER_SITE_OTHER): Payer: Medicare Other | Admitting: *Deleted

## 2018-08-29 DIAGNOSIS — Z5181 Encounter for therapeutic drug level monitoring: Secondary | ICD-10-CM | POA: Diagnosis not present

## 2018-08-29 DIAGNOSIS — I4891 Unspecified atrial fibrillation: Secondary | ICD-10-CM

## 2018-08-29 DIAGNOSIS — I639 Cerebral infarction, unspecified: Secondary | ICD-10-CM

## 2018-08-29 LAB — POCT INR: INR: 4.8 — AB (ref 2.0–3.0)

## 2018-08-29 NOTE — Patient Instructions (Addendum)
Hold coumadin tonight, take 1/2 tablet tomorrow night then resume 1 tablets daily except 1 tablet on Tuesdays and Fridays Recheck in 2 weeks

## 2018-09-10 ENCOUNTER — Ambulatory Visit (INDEPENDENT_AMBULATORY_CARE_PROVIDER_SITE_OTHER): Payer: Medicare Other | Admitting: *Deleted

## 2018-09-10 DIAGNOSIS — I4891 Unspecified atrial fibrillation: Secondary | ICD-10-CM

## 2018-09-10 DIAGNOSIS — I639 Cerebral infarction, unspecified: Secondary | ICD-10-CM | POA: Diagnosis not present

## 2018-09-10 DIAGNOSIS — Z5181 Encounter for therapeutic drug level monitoring: Secondary | ICD-10-CM

## 2018-09-10 DIAGNOSIS — Z952 Presence of prosthetic heart valve: Secondary | ICD-10-CM

## 2018-09-10 LAB — POCT INR: INR: 2.5 (ref 2.0–3.0)

## 2018-09-10 NOTE — Patient Instructions (Signed)
Continue coumadin 1 1/2 tablets daily except 1 tablet on Tuesdays and Fridays Recheck in 2 weeks

## 2018-09-14 ENCOUNTER — Ambulatory Visit: Payer: Medicare Other | Admitting: Cardiology

## 2018-09-14 ENCOUNTER — Encounter: Payer: Self-pay | Admitting: Cardiology

## 2018-09-14 VITALS — BP 116/60 | HR 102 | Ht 62.5 in | Wt 177.0 lb

## 2018-09-14 DIAGNOSIS — E782 Mixed hyperlipidemia: Secondary | ICD-10-CM | POA: Diagnosis not present

## 2018-09-14 DIAGNOSIS — Z952 Presence of prosthetic heart valve: Secondary | ICD-10-CM

## 2018-09-14 DIAGNOSIS — I4891 Unspecified atrial fibrillation: Secondary | ICD-10-CM | POA: Diagnosis not present

## 2018-09-14 NOTE — Patient Instructions (Signed)

## 2018-09-14 NOTE — Progress Notes (Signed)
Clinical Summary Ms. Felling is a 72 y.o.female seen today for follow up of the following medical problems.   1. Valvular heart disease  - 07/18/13 patient underwent MVR with 25 mm Edwards Magna-ease pericardial valve and also AVR with 21 mm Edwards Magna-ease pericardial valve   - last visit reported some SOB/DOE, occasional LE edema.  - we repeat her echo 06/2018, showed stable heart function and normal prosthetic valve function - she does have a history of obstructive lung disease with abnormal PFTs in 2014, on inhalers - with cooling of temperatures her symptoms have significantly improved since last visit.   - has had some issues with amoxicillin, upset stomach and yeast infection when having to take prior to dental surgery due to her artificial valves.   2. Afib  - hx of cardioembolic CVA, has been on longterm anticoag - no recent symptoms   3. Hyperlipidemia - she is compliant with meds   4. Carotid stenosis - 07/2013 [place] with mild bilateral stenosis   5. Dizziness - last visit lowered benicar to 10mg  daily due to orthostatic symptoms - since last visit symptoms have improved.    SH: daughter with Crohns with disease, they spend a lot of time supporting her.   Past Medical History:  Diagnosis Date  . Anemia   . Atrial fibrillation ([place])   . Breast carcinoma ([place]) 1995   1995  . Cardioembolic stroke ([place]) 04/6225   Right frontal in 01/2012; normal carotid ultrasound; possible LAA thrombus by TEE; virtual complete neurologic recovery  . Chronic kidney disease, stage 2, mildly decreased GFR    GFR of approximately 60  . Depression   . Diverticulosis of colon (without mention of hemorrhage) 2012   Dr. Laural Golden  . Fasting hyperglycemia    120 fasting  . Gastroesophageal reflux disease   . Gastroparesis   . Hemorrhoids   . Hyperlipidemia   . Hypertension    pt denies 05/30/13     Dr Kathe Mariner  . Hyponatremia   . Rheumatic heart disease    a. 07/2013 Echo: Ef 55-60%, Mod AS/AI, Mod-Sev MS, sev dil LA, PASP 58;  b. 07/2013 TEE: EF 35-40%, mild-mod AS/AI, mod-sev MS, LA smoke;  c. 07/2013 Cath: elev R heart pressures, nl cors.  . Shortness of breath      Allergies  Allergen Reactions  . Amiodarone Rash     Current Outpatient Medications  Medication Sig Dispense Refill  . albuterol (VENTOLIN HFA) 108 (90 BASE) MCG/ACT inhaler Inhale 2 puffs into the lungs every 4 (four) hours as needed for wheezing or shortness of breath.     Marland Kitchen atorvastatin (LIPITOR) 40 MG tablet TAKE ONE TABLET BY MOUTH ONCE DAILY. 90 tablet 2  . diltiazem (CARDIZEM CD) 180 MG 24 hr capsule Take 1 capsule (180 mg total) by mouth daily. 30 capsule 1  . escitalopram (LEXAPRO) 10 MG tablet Take 10 mg by mouth daily.     . fexofenadine (ALLEGRA) 30 MG tablet Take 30 mg by mouth daily.     Marland Kitchen FLOVENT HFA 44 MCG/ACT inhaler Inhale 2 puffs into the lungs 2 (two) times daily.     . fluticasone (FLONASE) 50 MCG/ACT nasal spray Place 1 spray into both nostrils daily as needed.     . furosemide (LASIX) 40 MG tablet TAKE ONE TABLET BY MOUTH ONCE DAILY.MAY TAKE AN ADDITIONAL TABLET AT BEDTIME AS NEEDED FOR SWELLING. 180 tablet 3  . levothyroxine (SYNTHROID, LEVOTHROID) 112 MCG tablet  Take 1 tablet by mouth daily. Pt taking 100 mcg.    . metFORMIN (GLUCOPHAGE) 500 MG tablet Take 1 tablet (500 mg total) by mouth 2 (two) times daily with a meal. 60 tablet 1  . metoprolol tartrate (LOPRESSOR) 25 MG tablet TAKE (1) TABLET BY MOUTH TWICE DAILY. 180 tablet 0  . Multiple Vitamin (MULITIVITAMIN WITH MINERALS) TABS Take 1 tablet by mouth daily.    Marland Kitchen olmesartan (BENICAR) 20 MG tablet Take 0.5 tablets (10 mg total) by mouth daily. 45 tablet 3  . potassium chloride SA (K-DUR,KLOR-CON) 20 MEQ tablet Take 20 mEq by mouth daily.    Marland Kitchen saccharomyces boulardii (FLORASTOR) 250 MG capsule Take 250 mg by mouth daily.    Marland Kitchen warfarin (COUMADIN) 2.5 MG tablet Take 1 1/2 tablets daily except 1  tablet on Mondays, Wednesdays and Fridays or as directed 60 tablet 6   No current facility-administered medications for this visit.      Past Surgical History:  Procedure Laterality Date  . AORTIC VALVE REPLACEMENT N/A 07/18/2013   Procedure: AORTIC VALVE REPLACEMENT (AVR);  Surgeon: Gaye Pollack, MD;  Location: [place];  Service: Open Heart Surgery;  Laterality: N/A;  . BACK SURGERY    . BREAST LUMPECTOMY Right 1995  . CARDIAC CATHETERIZATION    . CARDIOVERSION N/A 07/26/2013   Procedure: CARDIOVERSION;  Surgeon: Fay Records, MD;  Location: [facility] ENDOSCOPY;  Service: Cardiovascular;  Laterality: N/A;  . COLONOSCOPY  2012   Negative screening procedure  . DILATION AND CURETTAGE OF UTERUS    . ESOPHAGEAL MANOMETRY N/A 06/17/2013   Procedure: ESOPHAGEAL MANOMETRY (EM);  Surgeon: Sable Feil, MD;  Location: [facility] ENDOSCOPY;  Service: Endoscopy;  Laterality: N/A;  . INTRAOPERATIVE TRANSESOPHAGEAL ECHOCARDIOGRAM N/A 07/18/2013   Procedure: INTRAOPERATIVE TRANSESOPHAGEAL ECHOCARDIOGRAM;  Surgeon: Gaye Pollack, MD;  Location: [place] [facility];  Service: Open Heart Surgery;  Laterality: N/A;  . LEFT AND RIGHT HEART CATHETERIZATION WITH CORONARY ANGIOGRAM N/A 07/10/2013   Procedure: LEFT AND RIGHT HEART CATHETERIZATION WITH CORONARY ANGIOGRAM;  Surgeon: Burnell Blanks, MD;  Location: [facility] CATH LAB;  Service: Cardiovascular;  Laterality: N/A;  . LUMBAR LAMINECTOMY/DECOMPRESSION MICRODISCECTOMY Right 05/13/2014   Procedure: LUMBAR LAMINECTOMY/DECOMPRESSION MICRODISCECTOMY 1 LEVEL  lumbar four/five;  Surgeon: Faythe Ghee, MD;  Location: [facility] NEURO ORS;  Service: Neurosurgery;  Laterality: Right;  . MITRAL VALVE REPLACEMENT N/A 07/18/2013   Procedure: MITRAL VALVE (MV) REPLACEMENT;  Surgeon: Gaye Pollack, MD;  Location: [facility];  Service: Open Heart Surgery;  Laterality: N/A;  . MITRAL VALVE [place], closed mitral valvulotomy by finger fracture  . TEE WITHOUT CARDIOVERSION  01/24/2012    Procedure: TRANSESOPHAGEAL ECHOCARDIOGRAM (TEE);  Surgeon: Lelon Perla, MD;  Location: [facility] ENDOSCOPY;  Service: Cardiovascular;  Laterality: N/A;  . TEE WITHOUT CARDIOVERSION N/A 07/11/2013   Procedure: TRANSESOPHAGEAL ECHOCARDIOGRAM (TEE);  Surgeon: Thayer Headings, MD;  Location: [place];  Service: Cardiovascular;  Laterality: N/A;  . TEE WITHOUT CARDIOVERSION N/A 07/26/2013   Procedure: TRANSESOPHAGEAL ECHOCARDIOGRAM (TEE);  Surgeon: Fay Records, MD;  Location: [place];  Service: Cardiovascular;  Laterality: N/A;  . TUBAL LIGATION  1973     Allergies  Allergen Reactions  . Amiodarone Rash      Family History  Problem Relation Age of Onset  . Heart disease Brother 66       MI  . Rheum arthritis Maternal Grandmother   . Asthma Maternal Grandfather   . Hypothyroidism Mother   . Diabetes  Sister   . Cirrhosis Father      Social History Ms. Rigsbee reports that she has never smoked. She has never used smokeless tobacco. Ms. Michaux reports no history of alcohol use.   Review of Systems CONSTITUTIONAL: No weight loss, fever, chills, weakness or fatigue.  HEENT: Eyes: No visual loss, blurred vision, double vision or yellow sclerae.No hearing loss, sneezing, congestion, runny nose or sore throat.  SKIN: No rash or itching.  CARDIOVASCULAR: per hpi RESPIRATORY: No shortness of breath, cough or sputum.  GASTROINTESTINAL: No anorexia, nausea, vomiting or diarrhea. No abdominal pain or blood.  GENITOURINARY: No burning on urination, no polyuria NEUROLOGICAL: No headache, dizziness, syncope, paralysis, ataxia, numbness or tingling in the extremities. No change in bowel or bladder control.  MUSCULOSKELETAL: No muscle, back pain, joint pain or stiffness.  LYMPHATICS: No enlarged nodes. No history of splenectomy.  PSYCHIATRIC: No history of depression or anxiety.  ENDOCRINOLOGIC: No reports of sweating, cold or heat intolerance. No polyuria or polydipsia.   Marland Kitchen   Physical Examination Vitals:   09/14/18 0815  BP: 116/60  Pulse: (!) 102  SpO2: 96%   Vitals:   09/14/18 0815  Weight: 177 lb (80.3 kg)  Height: 5' 2.5" (1.588 m)    Gen: resting comfortably, no acute distress HEENT: no scleral icterus, pupils equal round and reactive, no palptable cervical adenopathy,  CV: irreg, no m/r/g, no jvd Resp: Clear to auscultation bilaterally GI: abdomen is soft, non-tender, non-distended, normal bowel sounds, no hepatosplenomegaly MSK: extremities are warm, no edema.  Skin: warm, no rash Neuro:  no focal deficits Psych: appropriate affect   Diagnostic Studies 07/2013 TTE Study Conclusions  - Study data: Technically difficult study. - Left ventricle: The cavity size was normal. Wall thickness was normal. Systolic function was normal. The estimated ejection fraction was in the range of 55% to 60%. Indeterminate diastolic function. There is evidence of very high left atrial pressures (E/e' 63) - Aortic valve: Trileaflet; mildly thickened leaflets. Accurate evaluation of aortic stenosis limited in the setting of severe mitral stenosis and decreased ventricular loadin. Morphologically there does not appear to be significant stenosis. By gradient (mean PG 21 mmHg) the stenosis is moderate, by AVA VTI (0.8) stenosis is severe. Moderate regurgitation, AR PHT is 439 cm/s. Valve area: 0.78cm^2(VTI). Valve area: 0.78cm^2 (Vmax). - Mitral valve: The findings are consistent with moderate to severe stenosis. (Mean PG 12 mmHg, MVA by PHT 1.3. Cannot use MVA by VTI due to moderate AI. Valve area by pressure half-time: 1.38cm^2. - Left atrium: The atrium was severely dilated. - Right ventricle: Systolic function was mildly reduced, RV TAPSE is 1.4, TV systolic annular tissue velocity 10 cm/s. - Pulmonary arteries: Systolic pressure was moderately increased. PA peak pressure: 46mm Hg (S) (PASP based on a fairly  faint spectral Doppler waveform)   07/2013 TEE Study Conclusions  - Left ventricle: Systolic function was moderately reduced. The estimated ejection fraction was in the range of 35% to 40%. - Aortic valve: There was mild to moderate stenosis. Mild to moderate regurgitation. - Mitral valve: The findings are consistent with moderate to severe stenosis. Valve area by pressure half-time: 1.45cm^2. - Left atrium: There was spontaneous echo contrast ("smoke"). - Atrial septum: No defect or patent foramen ovale was identified. - Tricuspid valve: No evidence of vegetation. Transesophageal echocardiography. 2D and color Doppler. Height: Height: 157.5cm. Height: 62in. Weight: Weight: 75kg. Weight: 165lb. Body mass index: BMI: 30.2kg/m^2. Body surface area:  BSA: 1.16m^2. Blood pressure: 143/56. Patient status:  Inpatient. Location: Endoscopy.   06/2018 echo Study Conclusions  - Left ventricle: The cavity size was normal. Wall thickness was   normal. Systolic function was normal. The estimated ejection   fraction was in the range of 60% to 65%. - Aortic valve: AV prosthesis is difficult to see Peak and mean   gradients through the valve are 18 and 10 mm Hg respectively. - Mitral valve: Peak and mean gradients through the valve   prosthesis are 19 and 4 mm Hg respectively MVA by P T1/2 is 3   cm2. s/p MV prosthesis. Valve prosthesis is difficult to see. - Pulmonary arteries: PA peak pressure: 48 mm Hg (S).  Assessment and Plan  1. Valvular heart disease  -stable prosthetic valve function by recent echo. Her SOB has resolved, seemed to mainly act up around times of very hot weather. If recurrence would consider further management of her lung disease as opposed to further cardiac workup - GI upset and vaginal yeast infection and labile coumadin levels when she takes amoxicillin prior to dental procedures, would be reasonable try azithromycin 500mg  or  cephalexin 2g instead to see if better tolerated.    2. Afib  - hx of prior cardioembolic CVA, has been continued on coumadin.  - no symptoms, continue current meds  3. Hyperlipidemia - continue statin    F/u 6 months     Arnoldo Lenis, M.D..

## 2018-09-24 ENCOUNTER — Ambulatory Visit (INDEPENDENT_AMBULATORY_CARE_PROVIDER_SITE_OTHER): Payer: Medicare Other | Admitting: *Deleted

## 2018-09-24 DIAGNOSIS — Z5181 Encounter for therapeutic drug level monitoring: Secondary | ICD-10-CM | POA: Diagnosis not present

## 2018-09-24 DIAGNOSIS — I4891 Unspecified atrial fibrillation: Secondary | ICD-10-CM

## 2018-09-24 DIAGNOSIS — I639 Cerebral infarction, unspecified: Secondary | ICD-10-CM | POA: Diagnosis not present

## 2018-09-24 LAB — POCT INR: INR: 2.7 (ref 2.0–3.0)

## 2018-09-24 NOTE — Patient Instructions (Signed)
Continue coumadin 1 1/2 tablets daily except 1 tablet on Tuesdays and Fridays Recheck in 3 weeks

## 2018-10-02 ENCOUNTER — Encounter: Payer: Self-pay | Admitting: *Deleted

## 2018-10-22 ENCOUNTER — Ambulatory Visit (INDEPENDENT_AMBULATORY_CARE_PROVIDER_SITE_OTHER): Payer: Medicare Other | Admitting: Pharmacist

## 2018-10-22 DIAGNOSIS — I639 Cerebral infarction, unspecified: Secondary | ICD-10-CM | POA: Diagnosis not present

## 2018-10-22 DIAGNOSIS — Z5181 Encounter for therapeutic drug level monitoring: Secondary | ICD-10-CM

## 2018-10-22 DIAGNOSIS — I4891 Unspecified atrial fibrillation: Secondary | ICD-10-CM

## 2018-10-22 LAB — POCT INR: INR: 2.7 (ref 2.0–3.0)

## 2018-10-22 NOTE — Patient Instructions (Signed)
Description   Continue coumadin 1 1/2 tablets daily except 1 tablet on Tuesdays and Fridays Recheck in 4 weeks

## 2018-10-25 ENCOUNTER — Other Ambulatory Visit (HOSPITAL_COMMUNITY): Payer: Self-pay | Admitting: Internal Medicine

## 2018-10-25 DIAGNOSIS — Z1231 Encounter for screening mammogram for malignant neoplasm of breast: Secondary | ICD-10-CM

## 2018-10-31 ENCOUNTER — Other Ambulatory Visit: Payer: Self-pay | Admitting: Cardiology

## 2018-11-19 ENCOUNTER — Ambulatory Visit (INDEPENDENT_AMBULATORY_CARE_PROVIDER_SITE_OTHER): Payer: Medicare Other | Admitting: Pharmacist

## 2018-11-19 DIAGNOSIS — I4891 Unspecified atrial fibrillation: Secondary | ICD-10-CM

## 2018-11-19 DIAGNOSIS — Z5181 Encounter for therapeutic drug level monitoring: Secondary | ICD-10-CM

## 2018-11-19 DIAGNOSIS — I639 Cerebral infarction, unspecified: Secondary | ICD-10-CM | POA: Diagnosis not present

## 2018-11-19 LAB — POCT INR: INR: 2.4 (ref 2.0–3.0)

## 2018-11-19 NOTE — Patient Instructions (Signed)
Description   Take 2 tablets today then continue coumadin 1 1/2 tablets daily except 1 tablet on Tuesdays and Fridays Recheck in 4 weeks

## 2018-12-05 ENCOUNTER — Other Ambulatory Visit: Payer: Self-pay

## 2018-12-05 NOTE — Patient Outreach (Signed)
[facility]) Care Management  12/05/2018  MADELEINE FENN 01-07-1946 [id number]   Medication Adherence call to Mrs. Tresa Endo HIPPA Compliant Voice message left with a call back number patient is due on Atorvastatin 40 mg. Mrs. Henion is showing past due under [place].   [facility] Management Direct Dial [phone]  Fax [phone] [email or web address]

## 2018-12-07 ENCOUNTER — Ambulatory Visit (HOSPITAL_COMMUNITY)
Admission: RE | Admit: 2018-12-07 | Discharge: 2018-12-07 | Disposition: A | Discharge status: Home / Self Care | Payer: Medicare Other | Source: Ambulatory Visit | Attending: Internal Medicine | Admitting: Internal Medicine

## 2018-12-07 ENCOUNTER — Encounter (HOSPITAL_COMMUNITY): Payer: Self-pay

## 2018-12-07 DIAGNOSIS — Z1231 Encounter for screening mammogram for malignant neoplasm of breast: Secondary | ICD-10-CM | POA: Insufficient documentation

## 2018-12-17 ENCOUNTER — Ambulatory Visit (INDEPENDENT_AMBULATORY_CARE_PROVIDER_SITE_OTHER): Payer: Medicare Other | Admitting: *Deleted

## 2018-12-17 DIAGNOSIS — I4891 Unspecified atrial fibrillation: Secondary | ICD-10-CM | POA: Diagnosis not present

## 2018-12-17 DIAGNOSIS — I639 Cerebral infarction, unspecified: Secondary | ICD-10-CM

## 2018-12-17 DIAGNOSIS — Z5181 Encounter for therapeutic drug level monitoring: Secondary | ICD-10-CM | POA: Diagnosis not present

## 2018-12-17 LAB — POCT INR: INR: 2.6 (ref 2.0–3.0)

## 2018-12-17 NOTE — Patient Instructions (Signed)
Continue coumadin 1 1/2 tablets daily except 1 tablet on Tuesdays and Fridays Recheck in 6 weeks 

## 2019-01-17 ENCOUNTER — Other Ambulatory Visit: Payer: Self-pay

## 2019-01-17 NOTE — Patient Outreach (Signed)
[facility]) Care Management  01/17/2019  [name] May 09, 1946 [id number]   Medication Adherence call to [name] spoke with patient she is due on Atorvastatin 40 mg she pick up this medication today but would like a 90 days supply,but will ask her doctor. [name] is showing past due under [place].   [facility] Management Direct Dial [phone]  Fax [phone] [email or web address]

## 2019-01-28 ENCOUNTER — Ambulatory Visit (INDEPENDENT_AMBULATORY_CARE_PROVIDER_SITE_OTHER): Payer: Medicare Other | Admitting: *Deleted

## 2019-01-28 ENCOUNTER — Other Ambulatory Visit: Payer: Self-pay

## 2019-01-28 DIAGNOSIS — Z5181 Encounter for therapeutic drug level monitoring: Secondary | ICD-10-CM | POA: Diagnosis not present

## 2019-01-28 DIAGNOSIS — I639 Cerebral infarction, unspecified: Secondary | ICD-10-CM | POA: Diagnosis not present

## 2019-01-28 DIAGNOSIS — I4891 Unspecified atrial fibrillation: Secondary | ICD-10-CM | POA: Diagnosis not present

## 2019-01-28 LAB — POCT INR: INR: 2.5 (ref 2.0–3.0)

## 2019-01-28 NOTE — Patient Instructions (Signed)
Continue coumadin 1 1/2 tablets daily except 1 tablet on Tuesdays and Fridays Recheck in 6 weeks

## 2019-01-30 ENCOUNTER — Other Ambulatory Visit: Payer: Self-pay | Admitting: Cardiology

## 2019-02-01 ENCOUNTER — Other Ambulatory Visit: Payer: Self-pay | Admitting: Cardiology

## 2019-03-05 ENCOUNTER — Encounter: Payer: Self-pay | Admitting: Orthopaedic Surgery

## 2019-03-05 ENCOUNTER — Ambulatory Visit: Payer: Medicare Other | Admitting: Orthopaedic Surgery

## 2019-03-05 ENCOUNTER — Other Ambulatory Visit: Payer: Self-pay

## 2019-03-05 ENCOUNTER — Ambulatory Visit (INDEPENDENT_AMBULATORY_CARE_PROVIDER_SITE_OTHER): Payer: Medicare Other

## 2019-03-05 VITALS — BP 119/77 | HR 89 | Ht 62.0 in | Wt 177.0 lb

## 2019-03-05 DIAGNOSIS — M25512 Pain in left shoulder: Secondary | ICD-10-CM

## 2019-03-05 DIAGNOSIS — Z7901 Long term (current) use of anticoagulants: Secondary | ICD-10-CM | POA: Diagnosis not present

## 2019-03-05 NOTE — Progress Notes (Signed)
Subjective:    Patient ID: [name], female    DOB: 11-Jul-1946, 73 y.o.   MRN: [id number]  HPI She has developed left shoulder pain over the last two to three weeks or so. She has no trauma, no swelling, no redness, no numbness.  It hurts to go over head.  She has been on anti-coagulation and is limited to NSAIDs.  She has seen Dr. Willey Blade and is referred here.  I have reviewed his notes.  Ice, heat and rest have not helped.   Review of Systems  Constitutional: Positive for activity change.  Musculoskeletal: Positive for arthralgias.  All other systems reviewed and are negative.  For Review of Systems, all other systems reviewed and are negative.  The following is a summary of the past history medically, past history surgically, known current medicines, social history and family history.  This information is gathered electronically by the computer from prior information and documentation.  I review this each visit and have found including this information at this point in the chart is beneficial and informative.   Past Medical History:  Diagnosis Date  . Anemia   . Atrial fibrillation ([place])   . Breast carcinoma ([place]) 1995   1995  . Cardioembolic stroke ([place]) 06/9370   Right frontal in 01/2012; normal carotid ultrasound; possible LAA thrombus by TEE; virtual complete neurologic recovery  . Chronic kidney disease, stage 2, mildly decreased GFR    GFR of approximately 60  . Depression   . Diverticulosis of colon (without mention of hemorrhage) 2012   Dr. Laural Golden  . Fasting hyperglycemia    120 fasting  . Gastroesophageal reflux disease   . Gastroparesis   . Hemorrhoids   . Hyperlipidemia   . Hypertension    pt denies 05/30/13     Dr Kathe Mariner  . Hyponatremia   . Rheumatic heart disease    a. 07/2013 Echo: Ef 55-60%, Mod AS/AI, Mod-Sev MS, sev dil LA, PASP 58;  b. 07/2013 TEE: EF 35-40%, mild-mod AS/AI, mod-sev MS, LA smoke;  c. 07/2013 Cath: elev R heart pressures, nl  cors.  . Shortness of breath     Past Surgical History:  Procedure Laterality Date  . AORTIC VALVE REPLACEMENT N/A 07/18/2013   Procedure: AORTIC VALVE REPLACEMENT (AVR);  Surgeon: Gaye Pollack, MD;  Location: [place];  Service: Open Heart Surgery;  Laterality: N/A;  . BACK SURGERY    . BREAST LUMPECTOMY Right 1995  . CARDIAC CATHETERIZATION    . CARDIOVERSION N/A 07/26/2013   Procedure: CARDIOVERSION;  Surgeon: Fay Records, MD;  Location: [facility] ENDOSCOPY;  Service: Cardiovascular;  Laterality: N/A;  . COLONOSCOPY  2012   Negative screening procedure  . DILATION AND CURETTAGE OF UTERUS    . ESOPHAGEAL MANOMETRY N/A 06/17/2013   Procedure: ESOPHAGEAL MANOMETRY (EM);  Surgeon: Sable Feil, MD;  Location: [facility] ENDOSCOPY;  Service: Endoscopy;  Laterality: N/A;  . INTRAOPERATIVE TRANSESOPHAGEAL ECHOCARDIOGRAM N/A 07/18/2013   Procedure: INTRAOPERATIVE TRANSESOPHAGEAL ECHOCARDIOGRAM;  Surgeon: Gaye Pollack, MD;  Location: [place] OR;  Service: Open Heart Surgery;  Laterality: N/A;  . LEFT AND RIGHT HEART CATHETERIZATION WITH CORONARY ANGIOGRAM N/A 07/10/2013   Procedure: LEFT AND RIGHT HEART CATHETERIZATION WITH CORONARY ANGIOGRAM;  Surgeon: Burnell Blanks, MD;  Location: [facility] CATH LAB;  Service: Cardiovascular;  Laterality: N/A;  . LUMBAR LAMINECTOMY/DECOMPRESSION MICRODISCECTOMY Right 05/13/2014   Procedure: LUMBAR LAMINECTOMY/DECOMPRESSION MICRODISCECTOMY 1 LEVEL  lumbar four/five;  Surgeon: Faythe Ghee, MD;  Location: [facility]  NEURO ORS;  Service: Neurosurgery;  Laterality: Right;  . MITRAL VALVE REPLACEMENT N/A 07/18/2013   Procedure: MITRAL VALVE (MV) REPLACEMENT;  Surgeon: Gaye Pollack, MD;  Location: [place] OR;  Service: Open Heart Surgery;  Laterality: N/A;  . MITRAL VALVE [place], closed mitral valvulotomy by finger fracture  . TEE WITHOUT CARDIOVERSION  01/24/2012   Procedure: TRANSESOPHAGEAL ECHOCARDIOGRAM (TEE);  Surgeon: Lelon Perla, MD;  Location: [facility] ENDOSCOPY;   Service: Cardiovascular;  Laterality: N/A;  . TEE WITHOUT CARDIOVERSION N/A 07/11/2013   Procedure: TRANSESOPHAGEAL ECHOCARDIOGRAM (TEE);  Surgeon: Thayer Headings, MD;  Location: [place];  Service: Cardiovascular;  Laterality: N/A;  . TEE WITHOUT CARDIOVERSION N/A 07/26/2013   Procedure: TRANSESOPHAGEAL ECHOCARDIOGRAM (TEE);  Surgeon: Fay Records, MD;  Location: [facility] ENDOSCOPY;  Service: Cardiovascular;  Laterality: N/A;  . TUBAL LIGATION  1973    Current Outpatient Medications on File Prior to Visit  Medication Sig Dispense Refill  . albuterol (VENTOLIN HFA) 108 (90 BASE) MCG/ACT inhaler Inhale 2 puffs into the lungs every 4 (four) hours as needed for wheezing or shortness of breath.     Marland Kitchen atorvastatin (LIPITOR) 40 MG tablet TAKE ONE TABLET BY MOUTH ONCE DAILY. 90 tablet 2  . diltiazem (CARDIZEM CD) 180 MG 24 hr capsule Take 1 capsule (180 mg total) by mouth daily. 30 capsule 1  . escitalopram (LEXAPRO) 10 MG tablet Take 10 mg by mouth daily.     . fexofenadine (ALLEGRA) 30 MG tablet Take 30 mg by mouth daily.     Marland Kitchen FLOVENT HFA 44 MCG/ACT inhaler Inhale 2 puffs into the lungs 2 (two) times daily.     . fluticasone (FLONASE) 50 MCG/ACT nasal spray Place 1 spray into both nostrils daily as needed.     . furosemide (LASIX) 40 MG tablet TAKE ONE TABLET BY MOUTH ONCE DAILY.MAY TAKE AN ADDITIONAL TABLET AT BEDTIME AS NEEDED FOR SWELLING. 180 tablet 3  . levothyroxine (SYNTHROID, LEVOTHROID) 112 MCG tablet Take 1 tablet by mouth daily. Pt taking 100 mcg.    . metFORMIN (GLUCOPHAGE) 500 MG tablet Take 1 tablet (500 mg total) by mouth 2 (two) times daily with a meal. 60 tablet 1  . metoprolol tartrate (LOPRESSOR) 25 MG tablet TAKE (1) TABLET BY MOUTH TWICE DAILY. 180 tablet 0  . Multiple Vitamin (MULITIVITAMIN WITH MINERALS) TABS Take 1 tablet by mouth daily.    Marland Kitchen olmesartan (BENICAR) 20 MG tablet Take 0.5 tablets (10 mg total) by mouth daily. 45 tablet 3  . potassium chloride SA (K-DUR,KLOR-CON)  20 MEQ tablet Take 20 mEq by mouth daily.    Marland Kitchen warfarin (COUMADIN) 2.5 MG tablet Take 1 1/2 tablets daily except 1 tablet on Mondays, Wednesdays and Fridays or as directed 60 tablet 6   No current facility-administered medications on file prior to visit.     Social History   Socioeconomic History  . Marital status: Married    Spouse name: Not on file  . Number of children: 1  . Years of education: Not on file  . Highest education level: Not on file  Occupational History  . Occupation: Retired Product manager: RETIRED  Social Needs  . Financial resource strain: Not on file  . Food insecurity:    Worry: Not on file    Inability: Not on file  . Transportation needs:    Medical: Not on file    Non-medical: Not on file  Tobacco Use  . Smoking status:  Never Smoker  . Smokeless tobacco: Never Used  Substance and Sexual Activity  . Alcohol use: No    Alcohol/week: 0.0 standard drinks  . Drug use: No  . Sexual activity: Not Currently  Lifestyle  . Physical activity:    Days per week: Not on file    Minutes per session: Not on file  . Stress: Not on file  Relationships  . Social connections:    Talks on phone: Not on file    Gets together: Not on file    Attends religious service: Not on file    Active member of club or organization: Not on file    Attends meetings of clubs or organizations: Not on file    Relationship status: Not on file  . Intimate partner violence:    Fear of current or ex partner: Not on file    Emotionally abused: Not on file    Physically abused: Not on file    Forced sexual activity: Not on file  Other Topics Concern  . Not on file  Social History Narrative   Married with children   No regular exercise    Family History  Problem Relation Age of Onset  . Heart disease Brother 44       MI  . Rheum arthritis Maternal Grandmother   . Asthma Maternal Grandfather   . Hypothyroidism Mother   . Diabetes Sister   . Cirrhosis Father     BP  119/77   Pulse 89   Ht 5\' 2"  (1.575 m)   Wt 177 lb (80.3 kg)   BMI 32.37 kg/m   Body mass index is 32.37 kg/m.     Objective:   Physical Exam Vitals signs reviewed.  Constitutional:      Appearance: She is well-developed.  HENT:     Head: Normocephalic and atraumatic.  Eyes:     Conjunctiva/sclera: Conjunctivae normal.     Pupils: Pupils are equal, round, and reactive to light.  Neck:     Musculoskeletal: Normal range of motion and neck supple.  Cardiovascular:     Rate and Rhythm: Normal rate and regular rhythm.  Pulmonary:     Effort: Pulmonary effort is normal.  Abdominal:     Palpations: Abdomen is soft.  Musculoskeletal:     Left shoulder: She exhibits tenderness.       Arms:  Skin:    General: Skin is warm and dry.  Neurological:     Mental Status: She is alert and oriented to person, place, and time.     Cranial Nerves: No cranial nerve deficit.     Motor: No abnormal muscle tone.     Coordination: Coordination normal.     Deep Tendon Reflexes: Reflexes are normal and symmetric. Reflexes normal.  Psychiatric:        Behavior: Behavior normal.        Thought Content: Thought content normal.        Judgment: Judgment normal.     X-rays were done of the left shoulder, reported separately.      Assessment & Plan:   Encounter Diagnoses  Name Primary?  . Acute pain of left shoulder Yes  . Chronic anticoagulation    PROCEDURE NOTE:  The patient request injection, verbal consent was obtained.  The left shoulder was prepped appropriately after time out was performed.   Sterile technique was observed and injection of 1 cc of Depo-Medrol 40 mg with several cc's of plain xylocaine. Anesthesia was provided by  ethyl chloride and a 20-gauge needle was used to inject the shoulder area. A posterior approach was used.  The injection was tolerated well.  A band aid dressing was applied.  The patient was advised to apply ice later today and tomorrow to the  injection sight as needed.  I will see her in three weeks.  Call if any problem.  Precautions discussed.   Electronically Signed Sanjuana Kava, MD 6/2/202010:44 AM

## 2019-03-11 ENCOUNTER — Telehealth: Payer: Self-pay | Admitting: *Deleted

## 2019-03-11 NOTE — Telephone Encounter (Signed)
° ° °  COVID-19 Pre-Screening Questions:   In the past 7 to 10 days have you had a cough,  shortness of breath, headache, congestion, fever (100 or greater) body aches, chills, sore throat, or sudden loss of taste or sense of smell?no    Have you been around anyone with known Covid 19.no    Have you been around anyone who is awaiting Covid 19 test results in the past 7 to 10 days? No    Have you been around anyone who has been exposed to Covid 19, or has mentioned symptoms of Covid 19 within the past 7 to 10 days? No    If you have any concerns/questions about symptoms patients report during screening (either on the phone or at threshold). Contact the provider seeing the patient or DOD for further guidance.  If neither are available contact a member of the leadership team.

## 2019-03-12 ENCOUNTER — Ambulatory Visit (INDEPENDENT_AMBULATORY_CARE_PROVIDER_SITE_OTHER): Payer: Medicare Other | Admitting: *Deleted

## 2019-03-12 DIAGNOSIS — Z5181 Encounter for therapeutic drug level monitoring: Secondary | ICD-10-CM | POA: Diagnosis not present

## 2019-03-12 DIAGNOSIS — I4891 Unspecified atrial fibrillation: Secondary | ICD-10-CM

## 2019-03-12 DIAGNOSIS — I639 Cerebral infarction, unspecified: Secondary | ICD-10-CM

## 2019-03-12 DIAGNOSIS — Z952 Presence of prosthetic heart valve: Secondary | ICD-10-CM

## 2019-03-12 LAB — POCT INR: INR: 2.4 (ref 2.0–3.0)

## 2019-03-12 NOTE — Patient Instructions (Signed)
Increase coumadin to 1 1/2 tablets daily except 1 tablet on Fridays Recheck in 6 weeks

## 2019-03-26 ENCOUNTER — Encounter: Payer: Self-pay | Admitting: Orthopaedic Surgery

## 2019-03-26 ENCOUNTER — Ambulatory Visit (INDEPENDENT_AMBULATORY_CARE_PROVIDER_SITE_OTHER): Payer: Medicare Other | Admitting: Orthopaedic Surgery

## 2019-03-26 ENCOUNTER — Other Ambulatory Visit: Payer: Self-pay

## 2019-03-26 VITALS — BP 114/60 | HR 98 | Temp 97.3°F | Ht 62.5 in | Wt 177.1 lb

## 2019-03-26 DIAGNOSIS — M25512 Pain in left shoulder: Secondary | ICD-10-CM

## 2019-03-26 DIAGNOSIS — Z7901 Long term (current) use of anticoagulants: Secondary | ICD-10-CM

## 2019-03-26 NOTE — Progress Notes (Signed)
Patient IW:[id number] [name], female DOB:08-Jul-1946, 73 y.o. IPJ:[id number]  Chief Complaint  Patient presents with  . Shoulder Pain    L dull pain x2 wks    HPI  [name] is a 73 y.o. female who has continued pain of the left shoulder.  The injection helped some but it still hurts her. She has no numbness or swelling or redness.  She has no new trauma.  I will begin OT for her.   Body mass index is 31.88 kg/m.  ROS  Review of Systems  Constitutional: Positive for activity change.  Musculoskeletal: Positive for arthralgias.  All other systems reviewed and are negative.   All other systems reviewed and are negative.  The following is a summary of the past history medically, past history surgically, known current medicines, social history and family history.  This information is gathered electronically by the computer from prior information and documentation.  I review this each visit and have found including this information at this point in the chart is beneficial and informative.    Past Medical History:  Diagnosis Date  . Anemia   . Atrial fibrillation ([place])   . Breast carcinoma ([place]) 1995   1995  . Cardioembolic stroke ([place]) 04/3418   Right frontal in 01/2012; normal carotid ultrasound; possible LAA thrombus by TEE; virtual complete neurologic recovery  . Chronic kidney disease, stage 2, mildly decreased GFR    GFR of approximately 60  . Depression   . Diverticulosis of colon (without mention of hemorrhage) 2012   Dr. Laural Golden  . Fasting hyperglycemia    120 fasting  . Gastroesophageal reflux disease   . Gastroparesis   . Hemorrhoids   . Hyperlipidemia   . Hypertension    pt denies 05/30/13     Dr Kathe Mariner  . Hyponatremia   . Rheumatic heart disease    a. 07/2013 Echo: Ef 55-60%, Mod AS/AI, Mod-Sev MS, sev dil LA, PASP 58;  b. 07/2013 TEE: EF 35-40%, mild-mod AS/AI, mod-sev MS, LA smoke;  c. 07/2013 Cath: elev R heart pressures, nl cors.  . Shortness  of breath     Past Surgical History:  Procedure Laterality Date  . AORTIC VALVE REPLACEMENT N/A 07/18/2013   Procedure: AORTIC VALVE REPLACEMENT (AVR);  Surgeon: Gaye Pollack, MD;  Location: [facility];  Service: Open Heart Surgery;  Laterality: N/A;  . BACK SURGERY    . BREAST LUMPECTOMY Right 1995  . CARDIAC CATHETERIZATION    . CARDIOVERSION N/A 07/26/2013   Procedure: CARDIOVERSION;  Surgeon: Fay Records, MD;  Location: [facility] ENDOSCOPY;  Service: Cardiovascular;  Laterality: N/A;  . COLONOSCOPY  2012   Negative screening procedure  . DILATION AND CURETTAGE OF UTERUS    . ESOPHAGEAL MANOMETRY N/A 06/17/2013   Procedure: ESOPHAGEAL MANOMETRY (EM);  Surgeon: Sable Feil, MD;  Location: [facility] ENDOSCOPY;  Service: Endoscopy;  Laterality: N/A;  . INTRAOPERATIVE TRANSESOPHAGEAL ECHOCARDIOGRAM N/A 07/18/2013   Procedure: INTRAOPERATIVE TRANSESOPHAGEAL ECHOCARDIOGRAM;  Surgeon: Gaye Pollack, MD;  Location: [facility] OR;  Service: Open Heart Surgery;  Laterality: N/A;  . LEFT AND RIGHT HEART CATHETERIZATION WITH CORONARY ANGIOGRAM N/A 07/10/2013   Procedure: LEFT AND RIGHT HEART CATHETERIZATION WITH CORONARY ANGIOGRAM;  Surgeon: Burnell Blanks, MD;  Location: [facility] CATH LAB;  Service: Cardiovascular;  Laterality: N/A;  . LUMBAR LAMINECTOMY/DECOMPRESSION MICRODISCECTOMY Right 05/13/2014   Procedure: LUMBAR LAMINECTOMY/DECOMPRESSION MICRODISCECTOMY 1 LEVEL  lumbar four/five;  Surgeon: Faythe Ghee, MD;  Location: [facility] NEURO ORS;  Service:  Neurosurgery;  Laterality: Right;  . MITRAL VALVE REPLACEMENT N/A 07/18/2013   Procedure: MITRAL VALVE (MV) REPLACEMENT;  Surgeon: Gaye Pollack, MD;  Location: [facility] OR;  Service: Open Heart Surgery;  Laterality: N/A;  . MITRAL VALVE [place], closed mitral valvulotomy by finger fracture  . TEE WITHOUT CARDIOVERSION  01/24/2012   Procedure: TRANSESOPHAGEAL ECHOCARDIOGRAM (TEE);  Surgeon: Lelon Perla, MD;  Location: [facility] ENDOSCOPY;  Service:  Cardiovascular;  Laterality: N/A;  . TEE WITHOUT CARDIOVERSION N/A 07/11/2013   Procedure: TRANSESOPHAGEAL ECHOCARDIOGRAM (TEE);  Surgeon: Thayer Headings, MD;  Location: [facility];  Service: Cardiovascular;  Laterality: N/A;  . TEE WITHOUT CARDIOVERSION N/A 07/26/2013   Procedure: TRANSESOPHAGEAL ECHOCARDIOGRAM (TEE);  Surgeon: Fay Records, MD;  Location: [facility] ENDOSCOPY;  Service: Cardiovascular;  Laterality: N/A;  . TUBAL LIGATION  1973    Family History  Problem Relation Age of Onset  . Heart disease Brother 51       MI  . Rheum arthritis Maternal Grandmother   . Asthma Maternal Grandfather   . Hypothyroidism Mother   . Diabetes Sister   . Cirrhosis Father     Social History Social History   Tobacco Use  . Smoking status: Never Smoker  . Smokeless tobacco: Never Used  Substance Use Topics  . Alcohol use: No    Alcohol/week: 0.0 standard drinks  . Drug use: No    Allergies  Allergen Reactions  . Amiodarone Rash    Current Outpatient Medications  Medication Sig Dispense Refill  . albuterol (VENTOLIN HFA) 108 (90 BASE) MCG/ACT inhaler Inhale 2 puffs into the lungs every 4 (four) hours as needed for wheezing or shortness of breath.     Marland Kitchen atorvastatin (LIPITOR) 40 MG tablet TAKE ONE TABLET BY MOUTH ONCE DAILY. 90 tablet 2  . diltiazem (CARDIZEM CD) 180 MG 24 hr capsule Take 1 capsule (180 mg total) by mouth daily. 30 capsule 1  . escitalopram (LEXAPRO) 10 MG tablet Take 10 mg by mouth daily.     . fexofenadine (ALLEGRA) 30 MG tablet Take 30 mg by mouth daily.     Marland Kitchen FLOVENT HFA 44 MCG/ACT inhaler Inhale 2 puffs into the lungs 2 (two) times daily.     . fluticasone (FLONASE) 50 MCG/ACT nasal spray Place 1 spray into both nostrils daily as needed.     . furosemide (LASIX) 40 MG tablet TAKE ONE TABLET BY MOUTH ONCE DAILY.MAY TAKE AN ADDITIONAL TABLET AT BEDTIME AS NEEDED FOR SWELLING. 180 tablet 3  . levothyroxine (SYNTHROID, LEVOTHROID) 112 MCG tablet Take 1 tablet by mouth  daily. Pt taking 100 mcg.    . metFORMIN (GLUCOPHAGE) 500 MG tablet Take 1 tablet (500 mg total) by mouth 2 (two) times daily with a meal. 60 tablet 1  . metoprolol tartrate (LOPRESSOR) 25 MG tablet TAKE (1) TABLET BY MOUTH TWICE DAILY. 180 tablet 0  . Multiple Vitamin (MULITIVITAMIN WITH MINERALS) TABS Take 1 tablet by mouth daily.    Marland Kitchen olmesartan (BENICAR) 20 MG tablet Take 0.5 tablets (10 mg total) by mouth daily. 45 tablet 3  . potassium chloride SA (K-DUR,KLOR-CON) 20 MEQ tablet Take 20 mEq by mouth daily.    Marland Kitchen warfarin (COUMADIN) 2.5 MG tablet Take 1 1/2 tablets daily except 1 tablet on Mondays, Wednesdays and Fridays or as directed 60 tablet 6   No current facility-administered medications for this visit.      Physical Exam  Blood pressure 114/60, pulse 98, height 5' 2.5" (  1.588 m), weight 177 lb 2 oz (80.3 kg).  Constitutional: overall normal hygiene, normal nutrition, well developed, normal grooming, normal body habitus. Assistive device:none  Musculoskeletal: gait and station Limp none, muscle tone and strength are normal, no tremors or atrophy is present.  .  Neurological: coordination overall normal.  Deep tendon reflex/nerve stretch intact.  Sensation normal.  Cranial nerves II-XII intact.   Skin:   Normal overall no scars, lesions, ulcers or rashes. No psoriasis.  Psychiatric: Alert and oriented x 3.  Recent memory intact, remote memory unclear.  Normal mood and affect. Well groomed.  Good eye contact.  Cardiovascular: overall no swelling, no varicosities, no edema bilaterally, normal temperatures of the legs and arms, no clubbing, cyanosis and good capillary refill.  Lymphatic: palpation is normal.  Left shoulder has full motion but tender in the extremes.  NV intact.  All other systems reviewed and are negative   The patient has been educated about the nature of the problem(s) and counseled on treatment options.  The patient appeared to understand what I have  discussed and is in agreement with it.  Encounter Diagnoses  Name Primary?  . Acute pain of left shoulder Yes  . Chronic anticoagulation     PLAN Call if any problems.  Precautions discussed.  Continue current medications.   Return to clinic 3 weeks   Begin OT.  Electronically Signed Sanjuana Kava, MD 6/23/20208:40 AM

## 2019-04-03 ENCOUNTER — Ambulatory Visit (HOSPITAL_COMMUNITY): Payer: Medicare Other | Attending: Orthopaedic Surgery

## 2019-04-03 ENCOUNTER — Other Ambulatory Visit: Payer: Self-pay

## 2019-04-03 ENCOUNTER — Encounter (HOSPITAL_COMMUNITY): Payer: Self-pay

## 2019-04-03 DIAGNOSIS — M25512 Pain in left shoulder: Secondary | ICD-10-CM | POA: Insufficient documentation

## 2019-04-03 NOTE — Therapy (Signed)
[facility] [address] [place], [place], [address] Phone: [phone]   Fax:  [phone]  Occupational Therapy Evaluation  Patient Details  Name: [name] MRN: [id number] Date of Birth: Jun 19, 1946 Referring Provider (OT): Dr. Sanjuana Kava   Encounter Date: 04/03/2019  OT End of Session - 04/03/19 0931    Visit Number  1    Number of Visits  1    Authorization Type  [employer] Medicare    Authorization Time Period  $20 co pay No visit limit    OT Start Time  0830    OT Stop Time  0900    OT Time Calculation (min)  30 min    Activity Tolerance  Patient tolerated treatment well    Behavior During Therapy  [name] for tasks assessed/performed       Past Medical History:  Diagnosis Date  . Anemia   . Atrial fibrillation ([place])   . Breast carcinoma ([place]) 1995   1995  . Cardioembolic stroke ([place]) 06/9832   Right frontal in 01/2012; normal carotid ultrasound; possible LAA thrombus by TEE; virtual complete neurologic recovery  . Chronic kidney disease, stage 2, mildly decreased GFR    GFR of approximately 60  . Depression   . Diverticulosis of colon (without mention of hemorrhage) 2012   Dr. Laural [name]  . Fasting hyperglycemia    120 fasting  . Gastroesophageal reflux disease   . Gastroparesis   . Hemorrhoids   . Hyperlipidemia   . Hypertension    pt denies 05/30/13     Dr [name]  . Hyponatremia   . Rheumatic heart disease    a. 07/2013 Echo: Ef 55-60%, Mod AS/AI, Mod-Sev MS, sev dil LA, PASP 58;  b. 07/2013 TEE: EF 35-40%, mild-mod AS/AI, mod-sev MS, LA smoke;  c. 07/2013 Cath: elev R heart pressures, nl cors.  . Shortness of breath     Past Surgical History:  Procedure Laterality Date  . AORTIC VALVE REPLACEMENT N/A 07/18/2013   Procedure: AORTIC VALVE REPLACEMENT (AVR);  Surgeon: Sara Pollack, MD;  Location: [facility];  Service: Open Heart Surgery;  Laterality: N/A;  . BACK SURGERY    . BREAST LUMPECTOMY Right 1995  .  CARDIAC CATHETERIZATION    . CARDIOVERSION N/A 07/26/2013   Procedure: CARDIOVERSION;  Surgeon: Sara Records, MD;  Location: [facility] ENDOSCOPY;  Service: Cardiovascular;  Laterality: N/A;  . COLONOSCOPY  2012   Negative screening procedure  . DILATION AND CURETTAGE OF UTERUS    . ESOPHAGEAL MANOMETRY N/A 06/17/2013   Procedure: ESOPHAGEAL MANOMETRY (EM);  Surgeon: Sara Feil, MD;  Location: [facility] ENDOSCOPY;  Service: Endoscopy;  Laterality: N/A;  . INTRAOPERATIVE TRANSESOPHAGEAL ECHOCARDIOGRAM N/A 07/18/2013   Procedure: INTRAOPERATIVE TRANSESOPHAGEAL ECHOCARDIOGRAM;  Surgeon: Sara Pollack, MD;  Location: [facility] OR;  Service: Open Heart Surgery;  Laterality: N/A;  . LEFT AND RIGHT HEART CATHETERIZATION WITH CORONARY ANGIOGRAM N/A 07/10/2013   Procedure: LEFT AND RIGHT HEART CATHETERIZATION WITH CORONARY ANGIOGRAM;  Surgeon: Sara Blanks, MD;  Location: [facility] CATH LAB;  Service: Cardiovascular;  Laterality: N/A;  . LUMBAR LAMINECTOMY/DECOMPRESSION MICRODISCECTOMY Right 05/13/2014   Procedure: LUMBAR LAMINECTOMY/DECOMPRESSION MICRODISCECTOMY 1 LEVEL  lumbar four/five;  Surgeon: Sara Ghee, MD;  Location: [facility] NEURO ORS;  Service: Neurosurgery;  Laterality: Right;  . MITRAL VALVE REPLACEMENT N/A 07/18/2013   Procedure: MITRAL VALVE (MV) REPLACEMENT;  Surgeon: Sara Pollack, MD;  Location: [facility] OR;  Service: Open Heart Surgery;  Laterality: N/A;  .  MITRAL VALVE SURGERY  [name], closed mitral valvulotomy by finger fracture  . TEE WITHOUT CARDIOVERSION  01/24/2012   Procedure: TRANSESOPHAGEAL ECHOCARDIOGRAM (TEE);  Surgeon: Sara Perla, MD;  Location: [facility] ENDOSCOPY;  Service: Cardiovascular;  Laterality: N/A;  . TEE WITHOUT CARDIOVERSION N/A 07/11/2013   Procedure: TRANSESOPHAGEAL ECHOCARDIOGRAM (TEE);  Surgeon: Sara Headings, MD;  Location: [name];  Service: Cardiovascular;  Laterality: N/A;  . TEE WITHOUT CARDIOVERSION N/A 07/26/2013   Procedure: TRANSESOPHAGEAL ECHOCARDIOGRAM  (TEE);  Surgeon: Sara Records, MD;  Location: [facility] ENDOSCOPY;  Service: Cardiovascular;  Laterality: N/A;  . TUBAL LIGATION  1973    There were no vitals filed for this visit.  Subjective Assessment - 04/03/19 0837    Subjective   S: I don't remeber when it started hurting but it feels good today.    Pertinent History  Patient is a 73 y/o female S/P left shoulder pain with no report of injury. Due to short term memory deficits, patient is unable to report when it started hurting or when it stopped hurting although today she presents to our clinic with no pain. Dr. Luna Glasgow has referred patient to occupational therapy for evaluation and treatment.    Patient Stated Goals  None stated.    Currently in Pain?  No/denies        [name] OT Assessment - 04/03/19 0001      Assessment   Medical Diagnosis  Left shoulder pain    Referring Provider (OT)  Dr. Sanjuana Kava    Onset Date/Surgical Date  --   unknown   Hand Dominance  Right    Next MD Visit  04/16/19    Prior Therapy  None      Precautions   Precautions  None      Restrictions   Weight Bearing Restrictions  No      Balance Screen   Has the patient fallen in the past 6 months  No      Home  Environment   Family/patient expects to be discharged to:  Private residence    Lives With  Spouse      Prior Function   Level of [place]  Retired    Leisure  Has a rescure dog and she walks her every day. Enjoys playing golf 3 times a week.       ADL   ADL comments  Pt reports no difficulty completing any daily tasks.       Mobility   Mobility Status  Independent      Written Expression   Dominant Hand  Right      Vision - History   Baseline Vision  Wears glasses all the time      Cognition   Overall Cognitive Status  History of cognitive impairments - at baseline    Memory  Impaired    Memory Impairment  Storage deficit;Decreased short term memory;Decreased recall of new information    Cognition  Comments  Pt reports that due to a CVA 5 years ago she has short term memory deficits.       Observation/Other Assessments   Focus on Therapeutic Outcomes (FOTO)   N/A      Posture/Postural Control   Posture/Postural Control  No significant limitations      ROM / Strength   AROM / PROM / Strength  AROM;Strength      Palpation   Palpation comment  No fascial restrictions in left UE shoulder,  trapezius, or scapularis region.       AROM   Overall AROM   Within functional limits for tasks performed    Overall AROM Comments  Seated with IR/er adducted. All shoulder ranges tested.       Strength   Overall Strength  Within functional limits for tasks performed    Overall Strength Comments  5/5 shoulder strength in all ranges. Seated. IR/er adducted                      OT Education - 04/03/19 0929    Education Details  shoulder stretches. Complete if shoulder begins to hurt again. If the pain is not managed with shoulder stretches then request a new therapy order from Dr. Luna Glasgow.    Person(s) Educated  Patient    Methods  Explanation;Demonstration;Verbal cues;Handout    Comprehension  Verbalized understanding       OT Short Term Goals - 04/03/19 1102      OT SHORT TERM GOAL #1   Title  pt will verbalize understanding of education for HEP to be completed if left shoulder pain returns in order to mange pain level and allow patient to increase functional use of her left UE.    Time  1    Period  Days    Status  Achieved    Target Date  04/03/19               Plan - 04/03/19 0932    Clinical Impression Statement  A: Patient is a 73 y/o female S/P left shoulder pain although presents today with none. She is unable to recall when it started hurting or when the pain stopped. LUE was assessed and therapist was unable to recreate the pain or find any deficits. Pt was provided with 3 shoulder stretches to complete if her pain returns. If the pain is not controlled  with the stretches she can ask for a new therapy order from her MD. Wrote instructions down for patient due to short term memory deficits. Patient agreed with recommendation.    OT Occupational Profile and History  Problem Focused Assessment - Including review of [name] relating to presenting problem    Occupational performance deficits (Please refer to evaluation for details):  --   N/A   Body Structure / Function / Physical Skills  Pain    Rehab Potential  Excellent    Clinical Decision Making  Limited treatment options, no task modification necessary    Comorbidities Affecting Occupational Performance:  None    Modification or Assistance to Complete Evaluation   No modification of tasks or assist necessary to complete eval    OT Frequency  One time visit    OT Treatment/Interventions  Patient/family education    Plan  P: 1 time evaluation with HEP.       Patient will benefit from skilled therapeutic intervention in order to improve the following deficits and impairments:   Body Structure / Function / Physical Skills: Pain       Visit Diagnosis: 1. Acute pain of left shoulder       Problem List Patient Active Problem List   Diagnosis Date Noted  . Asthma exacerbation 11/12/2016  . Acute CHF (congestive heart failure) ([place]) 11/12/2016  . Synovial cyst of lumbar spine 05/13/2014  . Encounter for therapeutic drug monitoring 11/06/2013  . Heart valve replaced by other means 08/05/2013  . Hyponatremia   . Atrial fibrillation ([place])   . Moderate aortic  stenosis 07/12/2013  . Moderate aortic insufficiency 07/12/2013  . Severe mitral valve stenosis 07/09/2013  . Mitral stenosis 07/08/2013  . Acute respiratory failure with hypoxia ([place]) 07/07/2013  . Acute pulmonary edema ([place]) 07/07/2013  . Pleural effusion, bilateral 07/07/2013  . Chest pain 07/07/2013  . Asthma, cough variant 03/27/2013  . Chronic cough 09/17/2012  . Dyspnea on exertion 06/13/2012  . Edema 06/13/2012  .  Rheumatic heart disease 06/13/2012  . Fasting hyperglycemia   . Chronic anticoagulation 01/26/2012  . Hypothyroidism 01/23/2012  . Cardioembolic stroke ([place]) 74/93/5521  . Hyperlipidemia   . Hypertension   . Breast carcinoma ([place])   . Gastroesophageal reflux disease    Ailene Ravel, OTR/L,CBIS  [phone]  04/03/2019, 11:04 AM  [facility] [address] [place], [place], [address] Phone: [phone]   Fax:  [phone]  Name: [name] MRN: [id number] Date of Birth: 1946-06-19

## 2019-04-03 NOTE — Patient Instructions (Signed)
Complete the following exercises 2-3 times a day.  Doorway Stretch  Place each hand opposite each other on the doorway. (You can change where you feel the stretch by moving arms higher or lower.) Step through with one foot and bend front knee until a stretch is felt and hold. Step through with the opposite foot on the next rep. Hold for __10-15___ seconds. Repeat __2__times.        Wall Flexion  Slide your arm up the wall or door frame until a stretch is felt in your shoulder . Hold for 10-15 seconds. Complete 2 times     Shoulder Abduction Stretch  Stand side ways by a wall with affected up on wall. Gently step in toward wall to feel stretch. Hold for 10-15 seconds. Complete 2 times.

## 2019-04-16 ENCOUNTER — Ambulatory Visit: Payer: Medicare Other | Admitting: Orthopaedic Surgery

## 2019-04-19 ENCOUNTER — Other Ambulatory Visit: Payer: Self-pay | Admitting: Cardiology

## 2019-04-23 ENCOUNTER — Ambulatory Visit (INDEPENDENT_AMBULATORY_CARE_PROVIDER_SITE_OTHER): Payer: Medicare Other | Admitting: *Deleted

## 2019-04-23 DIAGNOSIS — I639 Cerebral infarction, unspecified: Secondary | ICD-10-CM

## 2019-04-23 DIAGNOSIS — I4891 Unspecified atrial fibrillation: Secondary | ICD-10-CM | POA: Diagnosis not present

## 2019-04-23 DIAGNOSIS — Z5181 Encounter for therapeutic drug level monitoring: Secondary | ICD-10-CM | POA: Diagnosis not present

## 2019-04-23 LAB — POCT INR: INR: 2.7 (ref 2.0–3.0)

## 2019-04-23 NOTE — Patient Instructions (Signed)
Continue coumadin 1 1/2 tablets daily except 1 tablet on Fridays Recheck in 6 weeks

## 2019-04-27 ENCOUNTER — Other Ambulatory Visit: Payer: Self-pay | Admitting: Cardiology

## 2019-05-01 ENCOUNTER — Other Ambulatory Visit: Payer: Self-pay | Admitting: Cardiology

## 2019-06-04 ENCOUNTER — Ambulatory Visit: Payer: Medicare Other | Admitting: *Deleted

## 2019-06-04 ENCOUNTER — Other Ambulatory Visit: Payer: Self-pay

## 2019-06-04 DIAGNOSIS — Z952 Presence of prosthetic heart valve: Secondary | ICD-10-CM | POA: Diagnosis not present

## 2019-06-04 DIAGNOSIS — Z5181 Encounter for therapeutic drug level monitoring: Secondary | ICD-10-CM

## 2019-06-04 DIAGNOSIS — I4891 Unspecified atrial fibrillation: Secondary | ICD-10-CM

## 2019-06-04 DIAGNOSIS — I639 Cerebral infarction, unspecified: Secondary | ICD-10-CM | POA: Diagnosis not present

## 2019-06-04 LAB — POCT INR: INR: 2.7 (ref 2.0–3.0)

## 2019-06-04 NOTE — Patient Instructions (Signed)
Continue coumadin 1 1/2 tablets daily except 1 tablet on Fridays Recheck in 6 weeks 

## 2019-07-16 ENCOUNTER — Other Ambulatory Visit: Payer: Self-pay

## 2019-07-16 ENCOUNTER — Ambulatory Visit (INDEPENDENT_AMBULATORY_CARE_PROVIDER_SITE_OTHER): Payer: Medicare Other | Admitting: *Deleted

## 2019-07-16 DIAGNOSIS — I639 Cerebral infarction, unspecified: Secondary | ICD-10-CM

## 2019-07-16 DIAGNOSIS — Z5181 Encounter for therapeutic drug level monitoring: Secondary | ICD-10-CM | POA: Diagnosis not present

## 2019-07-16 DIAGNOSIS — I4891 Unspecified atrial fibrillation: Secondary | ICD-10-CM

## 2019-07-16 LAB — POCT INR: INR: 2.6 (ref 2.0–3.0)

## 2019-07-16 NOTE — Patient Instructions (Signed)
Continue coumadin 1 1/2 tablets daily except 1 tablet on Fridays Recheck in 6 weeks 

## 2019-07-24 ENCOUNTER — Other Ambulatory Visit: Payer: Self-pay | Admitting: Cardiology

## 2019-08-27 ENCOUNTER — Ambulatory Visit (INDEPENDENT_AMBULATORY_CARE_PROVIDER_SITE_OTHER): Payer: Medicare Other | Admitting: *Deleted

## 2019-08-27 ENCOUNTER — Other Ambulatory Visit: Payer: Self-pay

## 2019-08-27 DIAGNOSIS — I4891 Unspecified atrial fibrillation: Secondary | ICD-10-CM | POA: Diagnosis not present

## 2019-08-27 DIAGNOSIS — I639 Cerebral infarction, unspecified: Secondary | ICD-10-CM | POA: Diagnosis not present

## 2019-08-27 DIAGNOSIS — Z5181 Encounter for therapeutic drug level monitoring: Secondary | ICD-10-CM | POA: Diagnosis not present

## 2019-08-27 DIAGNOSIS — Z952 Presence of prosthetic heart valve: Secondary | ICD-10-CM | POA: Diagnosis not present

## 2019-08-27 LAB — POCT INR: INR: 3.5 — AB (ref 2.0–3.0)

## 2019-08-27 NOTE — Patient Instructions (Signed)
Continue coumadin 1 1/2 tablets daily except 1 tablet on Fridays Recheck in 6 weeks

## 2019-10-08 ENCOUNTER — Other Ambulatory Visit: Payer: Self-pay

## 2019-10-08 ENCOUNTER — Ambulatory Visit (INDEPENDENT_AMBULATORY_CARE_PROVIDER_SITE_OTHER): Payer: Medicare PPO | Admitting: *Deleted

## 2019-10-08 DIAGNOSIS — Z952 Presence of prosthetic heart valve: Secondary | ICD-10-CM

## 2019-10-08 DIAGNOSIS — Z5181 Encounter for therapeutic drug level monitoring: Secondary | ICD-10-CM | POA: Diagnosis not present

## 2019-10-08 DIAGNOSIS — I639 Cerebral infarction, unspecified: Secondary | ICD-10-CM | POA: Diagnosis not present

## 2019-10-08 DIAGNOSIS — I4891 Unspecified atrial fibrillation: Secondary | ICD-10-CM

## 2019-10-08 LAB — POCT INR: INR: 4 — AB (ref 2.0–3.0)

## 2019-10-08 NOTE — Patient Instructions (Signed)
Hold warfarin tonight then resume 1 1/2 tablets daily except 1 tablet on Fridays Eat salad/greens today Recheck in 3 weeks

## 2019-10-10 ENCOUNTER — Other Ambulatory Visit: Payer: Self-pay

## 2019-10-10 ENCOUNTER — Encounter: Payer: Self-pay | Admitting: Cardiology

## 2019-10-10 ENCOUNTER — Ambulatory Visit (INDEPENDENT_AMBULATORY_CARE_PROVIDER_SITE_OTHER): Payer: Medicare PPO | Admitting: Cardiology

## 2019-10-10 ENCOUNTER — Encounter: Payer: Self-pay | Admitting: *Deleted

## 2019-10-10 VITALS — BP 116/75 | HR 63 | Ht 62.5 in | Wt 189.8 lb

## 2019-10-10 DIAGNOSIS — I4891 Unspecified atrial fibrillation: Secondary | ICD-10-CM | POA: Diagnosis not present

## 2019-10-10 DIAGNOSIS — I38 Endocarditis, valve unspecified: Secondary | ICD-10-CM | POA: Diagnosis not present

## 2019-10-10 DIAGNOSIS — E782 Mixed hyperlipidemia: Secondary | ICD-10-CM | POA: Diagnosis not present

## 2019-10-10 NOTE — Progress Notes (Signed)
Clinical Summary Ms. Chisenhall is a 74 y.o.female seen today for follow up of the following medical problems.   1. Valvular heart disease  - 07/18/13 patient underwent MVR with 25 mm Edwards Magna-ease pericardial valve and also AVR with 21 mm Edwards Magna-ease pericardial valve    - has had some issues with amoxicillin, upset stomach and yeast infection when having to take prior to dental surgery due to her artificial valves.    - no recent SOB or DOE. No recent edema. Compliant with lasix. Some gradual weight gain she associates with diet and less activity due to covid.     2. Afib  - hx of cardioembolic CVA, has been on longterm anticoag  - no recent palpitations - no bleeding on coumadin.    3. Hyperlipidemia -compliant with statin, labs followed by pcp   4. Carotid stenosis - 07/2013 [place] with mild bilateral stenosis    SH: daughter with Crohns with disease, they spend a lot of time supporting her.   Past Medical History:  Diagnosis Date  . Anemia   . Atrial fibrillation ([place])   . Breast carcinoma ([place]) 1995   1995  . Cardioembolic stroke ([place]) [id number]   Right frontal in 01/2012; normal carotid ultrasound; possible LAA thrombus by TEE; virtual complete neurologic recovery  . Chronic kidney disease, stage 2, mildly decreased GFR    GFR of approximately 60  . Depression   . Diverticulosis of colon (without mention of hemorrhage) 2012   Dr. Laural Golden  . Fasting hyperglycemia    120 fasting  . Gastroesophageal reflux disease   . Gastroparesis   . Hemorrhoids   . Hyperlipidemia   . Hypertension    pt denies 05/30/13     Dr Kathe Mariner  . Hyponatremia   . Rheumatic heart disease    a. 07/2013 Echo: Ef 55-60%, Mod AS/AI, Mod-Sev MS, sev dil LA, PASP 58;  b. 07/2013 TEE: EF 35-40%, mild-mod AS/AI, mod-sev MS, LA smoke;  c. 07/2013 Cath: elev R heart pressures, nl cors.  . Shortness of breath      Allergies  Allergen Reactions  .  Amiodarone Rash     Current Outpatient Medications  Medication Sig Dispense Refill  . albuterol (VENTOLIN HFA) 108 (90 BASE) MCG/ACT inhaler Inhale 2 puffs into the lungs every 4 (four) hours as needed for wheezing or shortness of breath.     Marland Kitchen atorvastatin (LIPITOR) 40 MG tablet TAKE ONE TABLET BY MOUTH ONCE DAILY. 90 tablet 3  . diltiazem (CARDIZEM CD) 180 MG 24 hr capsule Take 1 capsule (180 mg total) by mouth daily. 30 capsule 1  . escitalopram (LEXAPRO) 10 MG tablet Take 10 mg by mouth daily.     . fexofenadine (ALLEGRA) 30 MG tablet Take 30 mg by mouth daily.     Marland Kitchen FLOVENT HFA 44 MCG/ACT inhaler Inhale 2 puffs into the lungs 2 (two) times daily.     . fluticasone (FLONASE) 50 MCG/ACT nasal spray Place 1 spray into both nostrils daily as needed.     . furosemide (LASIX) 40 MG tablet TAKE ONE TABLET BY MOUTH ONCE DAILY.MAY TAKE AN ADDITIONAL TABLET AT BEDTIME AS NEEDED FOR SWELLING. 180 tablet 3  . levothyroxine (SYNTHROID, LEVOTHROID) 112 MCG tablet Take 1 tablet by mouth daily. Pt taking 100 mcg.    . metFORMIN (GLUCOPHAGE) 500 MG tablet Take 1 tablet (500 mg total) by mouth 2 (two) times daily with a meal. 60 tablet 1  .  metoprolol tartrate (LOPRESSOR) 25 MG tablet TAKE (1) TABLET BY MOUTH TWICE DAILY. 180 tablet 3  . Multiple Vitamin (MULITIVITAMIN WITH MINERALS) TABS Take 1 tablet by mouth daily.    Marland Kitchen olmesartan (BENICAR) 20 MG tablet Take 0.5 tablets (10 mg total) by mouth daily. 45 tablet 3  . potassium chloride SA (K-DUR,KLOR-CON) 20 MEQ tablet Take 20 mEq by mouth daily.    Marland Kitchen warfarin (COUMADIN) 2.5 MG tablet TAKE AS DIRECTED BY COUMADIN CLINIC. 45 tablet 3   No current facility-administered medications for this visit.     Past Surgical History:  Procedure Laterality Date  . AORTIC VALVE REPLACEMENT N/A 07/18/2013   Procedure: AORTIC VALVE REPLACEMENT (AVR);  Surgeon: Gaye Pollack, MD;  Location: [facility];  Service: Open Heart Surgery;  Laterality: N/A;  . BACK SURGERY    .  BREAST LUMPECTOMY Right 1995  . CARDIAC CATHETERIZATION    . CARDIOVERSION N/A 07/26/2013   Procedure: CARDIOVERSION;  Surgeon: Fay Records, MD;  Location: [facility] ENDOSCOPY;  Service: Cardiovascular;  Laterality: N/A;  . COLONOSCOPY  2012   Negative screening procedure  . DILATION AND CURETTAGE OF UTERUS    . ESOPHAGEAL MANOMETRY N/A 06/17/2013   Procedure: ESOPHAGEAL MANOMETRY (EM);  Surgeon: Sable Feil, MD;  Location: [facility] ENDOSCOPY;  Service: Endoscopy;  Laterality: N/A;  . INTRAOPERATIVE TRANSESOPHAGEAL ECHOCARDIOGRAM N/A 07/18/2013   Procedure: INTRAOPERATIVE TRANSESOPHAGEAL ECHOCARDIOGRAM;  Surgeon: Gaye Pollack, MD;  Location: [place] OR;  Service: Open Heart Surgery;  Laterality: N/A;  . LEFT AND RIGHT HEART CATHETERIZATION WITH CORONARY ANGIOGRAM N/A 07/10/2013   Procedure: LEFT AND RIGHT HEART CATHETERIZATION WITH CORONARY ANGIOGRAM;  Surgeon: Burnell Blanks, MD;  Location: [facility] CATH LAB;  Service: Cardiovascular;  Laterality: N/A;  . LUMBAR LAMINECTOMY/DECOMPRESSION MICRODISCECTOMY Right 05/13/2014   Procedure: LUMBAR LAMINECTOMY/DECOMPRESSION MICRODISCECTOMY 1 LEVEL  lumbar four/five;  Surgeon: Faythe Ghee, MD;  Location: [facility] NEURO ORS;  Service: Neurosurgery;  Laterality: Right;  . MITRAL VALVE REPLACEMENT N/A 07/18/2013   Procedure: MITRAL VALVE (MV) REPLACEMENT;  Surgeon: Gaye Pollack, MD;  Location: [place] OR;  Service: Open Heart Surgery;  Laterality: N/A;  . MITRAL VALVE [place], closed mitral valvulotomy by finger fracture  . TEE WITHOUT CARDIOVERSION  01/24/2012   Procedure: TRANSESOPHAGEAL ECHOCARDIOGRAM (TEE);  Surgeon: Lelon Perla, MD;  Location: [facility] ENDOSCOPY;  Service: Cardiovascular;  Laterality: N/A;  . TEE WITHOUT CARDIOVERSION N/A 07/11/2013   Procedure: TRANSESOPHAGEAL ECHOCARDIOGRAM (TEE);  Surgeon: Thayer Headings, MD;  Location: [place];  Service: Cardiovascular;  Laterality: N/A;  . TEE WITHOUT CARDIOVERSION N/A 07/26/2013    Procedure: TRANSESOPHAGEAL ECHOCARDIOGRAM (TEE);  Surgeon: Fay Records, MD;  Location: [place];  Service: Cardiovascular;  Laterality: N/A;  . TUBAL LIGATION  1973     Allergies  Allergen Reactions  . Amiodarone Rash      Family History  Problem Relation Age of Onset  . Heart disease Brother 46       MI  . Rheum arthritis Maternal Grandmother   . Asthma Maternal Grandfather   . Hypothyroidism Mother   . Diabetes Sister   . Cirrhosis Father      Social History Ms. Behen reports that she has never smoked. She has never used smokeless tobacco. Ms. Goedken reports no history of alcohol use.   Review of Systems CONSTITUTIONAL: No weight loss, fever, chills, weakness or fatigue.  HEENT: Eyes: No visual loss, blurred vision, double vision or yellow sclerae.No hearing loss, sneezing, congestion,  runny nose or sore throat.  SKIN: No rash or itching.  CARDIOVASCULAR: per hpi RESPIRATORY: No shortness of breath, cough or sputum.  GASTROINTESTINAL: No anorexia, nausea, vomiting or diarrhea. No abdominal pain or blood.  GENITOURINARY: No burning on urination, no polyuria NEUROLOGICAL: No headache, dizziness, syncope, paralysis, ataxia, numbness or tingling in the extremities. No change in bowel or bladder control.  MUSCULOSKELETAL: No muscle, back pain, joint pain or stiffness.  LYMPHATICS: No enlarged nodes. No history of splenectomy.  PSYCHIATRIC: No history of depression or anxiety.  ENDOCRINOLOGIC: No reports of sweating, cold or heat intolerance. No polyuria or polydipsia.  Marland Kitchen   Physical Examination Today's Vitals   10/10/19 1425  BP: 116/75  Pulse: 63  SpO2: 98%  Weight: 189 lb 12.8 oz (86.1 kg)  Height: 5' 2.5" (1.588 m)   Body mass index is 34.16 kg/m.  Gen: resting comfortably, no acute distress HEENT: no scleral icterus, pupils equal round and reactive, no palptable cervical adenopathy,  CV: RRR, 2/6 systolic murmur apex, no jvd Resp: Clear to  auscultation bilaterally GI: abdomen is soft, non-tender, non-distended, normal bowel sounds, no hepatosplenomegaly MSK: extremities are warm, no edema.  Skin: warm, no rash Neuro:  no focal deficits Psych: appropriate affect   Diagnostic Studies  07/2013 TTE Study Conclusions  - Study data: Technically difficult study. - Left ventricle: The cavity size was normal. Wall thickness was normal. Systolic function was normal. The estimated ejection fraction was in the range of 55% to 60%. Indeterminate diastolic function. There is evidence of very high left atrial pressures (E/e' 63) - Aortic valve: Trileaflet; mildly thickened leaflets. Accurate evaluation of aortic stenosis limited in the setting of severe mitral stenosis and decreased ventricular loadin. Morphologically there does not appear to be significant stenosis. By gradient (mean PG 21 mmHg) the stenosis is moderate, by AVA VTI (0.8) stenosis is severe. Moderate regurgitation, AR PHT is 439 cm/s. Valve area: 0.78cm^2(VTI). Valve area: 0.78cm^2 (Vmax). - Mitral valve: The findings are consistent with moderate to severe stenosis. (Mean PG 12 mmHg, MVA by PHT 1.3. Cannot use MVA by VTI due to moderate AI. Valve area by pressure half-time: 1.38cm^2. - Left atrium: The atrium was severely dilated. - Right ventricle: Systolic function was mildly reduced, RV TAPSE is 1.4, TV systolic annular tissue velocity 10 cm/s. - Pulmonary arteries: Systolic pressure was moderately increased. PA peak pressure: 75mm Hg (S) (PASP based on a fairly faint spectral Doppler waveform)   07/2013 TEE Study Conclusions  - Left ventricle: Systolic function was moderately reduced. The estimated ejection fraction was in the range of 35% to 40%. - Aortic valve: There was mild to moderate stenosis. Mild to moderate regurgitation. - Mitral valve: The findings are consistent with moderate to severe  stenosis. Valve area by pressure half-time: 1.45cm^2. - Left atrium: There was spontaneous echo contrast ("smoke"). - Atrial septum: No defect or patent foramen ovale was identified. - Tricuspid valve: No evidence of vegetation. Transesophageal echocardiography. 2D and color Doppler. Height: Height: 157.5cm. Height: 62in. Weight: Weight: 75kg. Weight: 165lb. Body mass index: BMI: 30.2kg/m^2. Body surface area:  BSA: 1.54m^2. Blood pressure: 143/56. Patient status: Inpatient. Location: Endoscopy.   06/2018 echo Study Conclusions  - Left ventricle: The cavity size was normal. Wall thickness was normal. Systolic function was normal. The estimated ejection fraction was in the range of 60% to 65%. - Aortic valve: AV prosthesis is difficult to see Peak and mean gradients through the valve are 18 and 10 mm Hg respectively. -  Mitral valve: Peak and mean gradients through the valve prosthesis are 19 and 4 mm Hg respectively MVA by P T1/2 is 3 cm2. s/p MV prosthesis. Valve prosthesis is difficult to see. - Pulmonary arteries: PA peak pressure: 48 mm Hg (S).   Assessment and Plan   1. Valvular heart disease  -doing well without symptoms, continue current meds   2. Afib  - hx of prior cardioembolic CVA, has been continued on coumadin.  - denies any recent symptoms, continue current meds - EKG shows rate controlled afib  3. Hyperlipidemia She will continue staitn, reuqest labs from pcp  F/u 6 months     Arnoldo Lenis, M.D.

## 2019-10-10 NOTE — Patient Instructions (Signed)

## 2019-10-12 ENCOUNTER — Ambulatory Visit: Payer: Medicare Other | Attending: Internal Medicine

## 2019-10-12 DIAGNOSIS — Z23 Encounter for immunization: Secondary | ICD-10-CM | POA: Insufficient documentation

## 2019-10-12 NOTE — Progress Notes (Signed)
   Covid-19 Vaccination Clinic  Name:  [name]    MRN: [id number] DOB: Feb 08, 1946  10/12/2019  [name] was observed post Covid-19 immunization for 15 minutes without incidence. She was provided with Vaccine Information Sheet and instruction to access the V-Safe system.   [name] was instructed to call 911 with any severe reactions post vaccine: Marland Kitchen Difficulty breathing  . Swelling of your face and throat  . A fast heartbeat  . A bad rash all over your body  . Dizziness and weakness    Immunizations Administered    Name Date Dose VIS Date Route   Pfizer COVID-19 Vaccine 10/12/2019  2:41 PM 0.3 mL 09/13/2019 Intramuscular   Manufacturer: [place]   Lot: [id number]   [place]: [id number]

## 2019-10-22 ENCOUNTER — Ambulatory Visit: Payer: Self-pay | Admitting: *Deleted

## 2019-10-22 ENCOUNTER — Telehealth: Payer: Self-pay | Admitting: *Deleted

## 2019-10-22 NOTE — Telephone Encounter (Signed)
Spoke with patient's spouse.  Literature show no interaction between terconazole and warfarin.  He found out this morning pt used monistat over the weekend.  Has an INR appt scheduled for 10/29/19.  Instructed pt to eat extra Vit K foods for several days and keep INR appt for next week.  He verbalized understanding. To call back if pt has any sign of bleeding or increased bruising.

## 2019-10-22 NOTE — Telephone Encounter (Signed)
Please call pt's husband concerning coumadin dose- pt has been put on Terconazole by Dr. Willey Blade   [phone]

## 2019-10-29 ENCOUNTER — Other Ambulatory Visit: Payer: Self-pay

## 2019-10-29 ENCOUNTER — Other Ambulatory Visit (HOSPITAL_COMMUNITY)
Admission: RE | Admit: 2019-10-29 | Discharge: 2019-10-29 | Disposition: A | Discharge status: Home / Self Care | Payer: Medicare PPO | Source: Ambulatory Visit | Attending: Cardiology | Admitting: Cardiology

## 2019-10-29 ENCOUNTER — Ambulatory Visit (INDEPENDENT_AMBULATORY_CARE_PROVIDER_SITE_OTHER): Payer: Medicare PPO | Admitting: *Deleted

## 2019-10-29 DIAGNOSIS — Z5181 Encounter for therapeutic drug level monitoring: Secondary | ICD-10-CM

## 2019-10-29 DIAGNOSIS — Z952 Presence of prosthetic heart valve: Secondary | ICD-10-CM | POA: Diagnosis not present

## 2019-10-29 DIAGNOSIS — I639 Cerebral infarction, unspecified: Secondary | ICD-10-CM

## 2019-10-29 DIAGNOSIS — I4891 Unspecified atrial fibrillation: Secondary | ICD-10-CM | POA: Insufficient documentation

## 2019-10-29 LAB — PROTIME-INR
INR: 7.2 (ref 0.8–1.2)
Prothrombin Time: 62.3 seconds — ABNORMAL HIGH (ref 11.4–15.2)

## 2019-10-29 LAB — POCT INR: INR: 8 — AB (ref 2.0–3.0)

## 2019-10-29 NOTE — Patient Instructions (Signed)
POC INR >8.0      Sent to [facility] lab for stat PT/INR 7.2 Hold warfarin tonight and tomorrow night and come for INR check on Thursday Pt denies bleeding or increased bruising. Bleeding and fall precautions discussed with pt and she verbalized understanding.  To report to [facility] ED if bleeding occurs or she has a fall. Instructions given to pt while in office and husband on phone.

## 2019-10-31 ENCOUNTER — Other Ambulatory Visit (HOSPITAL_COMMUNITY)
Admission: RE | Admit: 2019-10-31 | Discharge: 2019-10-31 | Disposition: A | Discharge status: Home / Self Care | Payer: Medicare PPO | Source: Ambulatory Visit | Attending: Cardiology | Admitting: Cardiology

## 2019-10-31 ENCOUNTER — Other Ambulatory Visit: Payer: Self-pay

## 2019-10-31 ENCOUNTER — Ambulatory Visit (INDEPENDENT_AMBULATORY_CARE_PROVIDER_SITE_OTHER): Payer: Medicare PPO | Admitting: *Deleted

## 2019-10-31 DIAGNOSIS — Z952 Presence of prosthetic heart valve: Secondary | ICD-10-CM

## 2019-10-31 DIAGNOSIS — I4891 Unspecified atrial fibrillation: Secondary | ICD-10-CM

## 2019-10-31 DIAGNOSIS — I639 Cerebral infarction, unspecified: Secondary | ICD-10-CM

## 2019-10-31 DIAGNOSIS — Z5181 Encounter for therapeutic drug level monitoring: Secondary | ICD-10-CM | POA: Diagnosis not present

## 2019-10-31 LAB — PROTIME-INR
INR: 5.6 (ref 0.8–1.2)
Prothrombin Time: 50.6 seconds — ABNORMAL HIGH (ref 11.4–15.2)

## 2019-10-31 LAB — POCT INR: INR: 8 — AB (ref 2.0–3.0)

## 2019-10-31 NOTE — Patient Instructions (Signed)
POC INR >8.0      Sent to [facility] lab for stat PT/INR  5.6 Hold warfarin tonight and tomorrow night then resume 1 1/2 tablets daily except 1 tablet on Fridays Pt denies bleeding or increased bruising. Bleeding and fall precautions discussed with pt and she verbalized understanding.  To report to [facility] ED if bleeding occurs or she has a fall. Called instructions to husband Recheck in 1 week

## 2019-11-02 ENCOUNTER — Ambulatory Visit: Payer: Medicare PPO

## 2019-11-02 ENCOUNTER — Ambulatory Visit: Payer: Medicare PPO | Attending: Internal Medicine

## 2019-11-02 DIAGNOSIS — Z23 Encounter for immunization: Secondary | ICD-10-CM | POA: Insufficient documentation

## 2019-11-02 NOTE — Progress Notes (Signed)
   Covid-19 Vaccination Clinic  Name:  [name]    MRN: [id number] DOB: 1946-02-18  11/02/2019  [name] was observed post Covid-19 immunization for 15 minutes without incidence. She was provided with Vaccine Information Sheet and instruction to access the V-Safe system.   [name] was instructed to call 911 with any severe reactions post vaccine: Marland Kitchen Difficulty breathing  . Swelling of your face and throat  . A fast heartbeat  . A bad rash all over your body  . Dizziness and weakness    Immunizations Administered    Name Date Dose VIS Date Route   Pfizer COVID-19 Vaccine 11/02/2019  1:04 PM 0.3 mL 09/13/2019 Intramuscular   Manufacturer: [place]   Lot: [id number]   [place]: [id number]

## 2019-11-07 ENCOUNTER — Ambulatory Visit (INDEPENDENT_AMBULATORY_CARE_PROVIDER_SITE_OTHER): Payer: Medicare PPO | Admitting: *Deleted

## 2019-11-07 ENCOUNTER — Other Ambulatory Visit: Payer: Self-pay

## 2019-11-07 DIAGNOSIS — Z952 Presence of prosthetic heart valve: Secondary | ICD-10-CM

## 2019-11-07 DIAGNOSIS — Z5181 Encounter for therapeutic drug level monitoring: Secondary | ICD-10-CM | POA: Diagnosis not present

## 2019-11-07 DIAGNOSIS — I639 Cerebral infarction, unspecified: Secondary | ICD-10-CM | POA: Diagnosis not present

## 2019-11-07 DIAGNOSIS — I4891 Unspecified atrial fibrillation: Secondary | ICD-10-CM

## 2019-11-07 LAB — POCT INR: INR: 7.4 — AB (ref 2.0–3.0)

## 2019-11-07 NOTE — Patient Instructions (Signed)
Hold warfarin x 4 days and repeat INR on 11/11/19   Denies using any meds for yeast since last office visit. Pt denies bleeding or increased bruising. Bleeding and fall precautions discussed with pt and she verbalized understanding.  To report to [facility] ED if bleeding occurs or she has a fall. Recheck in 1 week

## 2019-11-11 ENCOUNTER — Ambulatory Visit (INDEPENDENT_AMBULATORY_CARE_PROVIDER_SITE_OTHER): Payer: Medicare PPO | Admitting: *Deleted

## 2019-11-11 ENCOUNTER — Other Ambulatory Visit: Payer: Self-pay

## 2019-11-11 DIAGNOSIS — Z5181 Encounter for therapeutic drug level monitoring: Secondary | ICD-10-CM

## 2019-11-11 DIAGNOSIS — I639 Cerebral infarction, unspecified: Secondary | ICD-10-CM

## 2019-11-11 DIAGNOSIS — I4891 Unspecified atrial fibrillation: Secondary | ICD-10-CM | POA: Diagnosis not present

## 2019-11-11 LAB — POCT INR: INR: 4.5 — AB (ref 2.0–3.0)

## 2019-11-11 NOTE — Patient Instructions (Signed)
Hold warfarin today then decrease dose to 1 tablet daily.   Recheck 11/18/19

## 2019-11-19 ENCOUNTER — Ambulatory Visit (INDEPENDENT_AMBULATORY_CARE_PROVIDER_SITE_OTHER): Payer: Medicare PPO | Admitting: *Deleted

## 2019-11-19 ENCOUNTER — Other Ambulatory Visit: Payer: Self-pay

## 2019-11-19 DIAGNOSIS — Z5181 Encounter for therapeutic drug level monitoring: Secondary | ICD-10-CM | POA: Diagnosis not present

## 2019-11-19 DIAGNOSIS — I4891 Unspecified atrial fibrillation: Secondary | ICD-10-CM

## 2019-11-19 DIAGNOSIS — I639 Cerebral infarction, unspecified: Secondary | ICD-10-CM

## 2019-11-19 DIAGNOSIS — Z952 Presence of prosthetic heart valve: Secondary | ICD-10-CM | POA: Diagnosis not present

## 2019-11-19 LAB — POCT INR: INR: 5.3 — AB (ref 2.0–3.0)

## 2019-11-19 NOTE — Patient Instructions (Signed)
Hold warfarin today and tomorrow then decrease dose to 1 tablet daily except 1/2 tablet on Tuesdays, Thursdays and Saturdays   Recheck 11/28/19

## 2019-11-28 ENCOUNTER — Ambulatory Visit (INDEPENDENT_AMBULATORY_CARE_PROVIDER_SITE_OTHER): Payer: Medicare PPO | Admitting: *Deleted

## 2019-11-28 ENCOUNTER — Other Ambulatory Visit: Payer: Self-pay

## 2019-11-28 DIAGNOSIS — I4891 Unspecified atrial fibrillation: Secondary | ICD-10-CM | POA: Diagnosis not present

## 2019-11-28 DIAGNOSIS — Z5181 Encounter for therapeutic drug level monitoring: Secondary | ICD-10-CM

## 2019-11-28 DIAGNOSIS — Z952 Presence of prosthetic heart valve: Secondary | ICD-10-CM

## 2019-11-28 DIAGNOSIS — I639 Cerebral infarction, unspecified: Secondary | ICD-10-CM

## 2019-11-28 LAB — POCT INR: INR: 3.5 — AB (ref 2.0–3.0)

## 2019-11-28 NOTE — Patient Instructions (Signed)
Continue warfarin 1 tablet daily except 1/2 tablet on Tuesdays, Thursdays and Saturdays   Recheck 12/10/19

## 2019-12-10 ENCOUNTER — Ambulatory Visit (INDEPENDENT_AMBULATORY_CARE_PROVIDER_SITE_OTHER): Payer: Medicare PPO | Admitting: *Deleted

## 2019-12-10 ENCOUNTER — Other Ambulatory Visit: Payer: Self-pay

## 2019-12-10 DIAGNOSIS — I4891 Unspecified atrial fibrillation: Secondary | ICD-10-CM

## 2019-12-10 DIAGNOSIS — I639 Cerebral infarction, unspecified: Secondary | ICD-10-CM | POA: Diagnosis not present

## 2019-12-10 DIAGNOSIS — Z952 Presence of prosthetic heart valve: Secondary | ICD-10-CM | POA: Diagnosis not present

## 2019-12-10 DIAGNOSIS — Z5181 Encounter for therapeutic drug level monitoring: Secondary | ICD-10-CM

## 2019-12-10 LAB — POCT INR: INR: 2.5 (ref 2.0–3.0)

## 2019-12-10 NOTE — Patient Instructions (Signed)
Continue warfarin 1 tablet daily except 1/2 tablet on Tuesdays, Thursdays and Saturdays   Recheck in 3 wks/'lr

## 2019-12-19 ENCOUNTER — Other Ambulatory Visit: Payer: Self-pay | Admitting: Cardiology

## 2019-12-24 DIAGNOSIS — E1129 Type 2 diabetes mellitus with other diabetic kidney complication: Secondary | ICD-10-CM | POA: Diagnosis not present

## 2019-12-31 ENCOUNTER — Other Ambulatory Visit: Payer: Self-pay

## 2019-12-31 ENCOUNTER — Ambulatory Visit (INDEPENDENT_AMBULATORY_CARE_PROVIDER_SITE_OTHER): Payer: Medicare PPO | Admitting: *Deleted

## 2019-12-31 DIAGNOSIS — Z952 Presence of prosthetic heart valve: Secondary | ICD-10-CM

## 2019-12-31 DIAGNOSIS — I4891 Unspecified atrial fibrillation: Secondary | ICD-10-CM | POA: Diagnosis not present

## 2019-12-31 DIAGNOSIS — Z5181 Encounter for therapeutic drug level monitoring: Secondary | ICD-10-CM | POA: Diagnosis not present

## 2019-12-31 DIAGNOSIS — I639 Cerebral infarction, unspecified: Secondary | ICD-10-CM

## 2019-12-31 LAB — POCT INR: INR: 1.7 — AB (ref 2.0–3.0)

## 2019-12-31 NOTE — Patient Instructions (Signed)
Take warfarin 1 1/2 tablets today then increase dose to 1 tablet daily except 1/2 tablets on Tuesdays   Recheck in 2 wks/'lr

## 2020-01-01 ENCOUNTER — Other Ambulatory Visit: Payer: Self-pay | Admitting: Cardiology

## 2020-01-02 DIAGNOSIS — I5032 Chronic diastolic (congestive) heart failure: Secondary | ICD-10-CM | POA: Diagnosis not present

## 2020-01-02 DIAGNOSIS — E039 Hypothyroidism, unspecified: Secondary | ICD-10-CM | POA: Diagnosis not present

## 2020-01-02 DIAGNOSIS — E1129 Type 2 diabetes mellitus with other diabetic kidney complication: Secondary | ICD-10-CM | POA: Diagnosis not present

## 2020-01-02 DIAGNOSIS — E785 Hyperlipidemia, unspecified: Secondary | ICD-10-CM | POA: Diagnosis not present

## 2020-01-02 DIAGNOSIS — F0390 Unspecified dementia without behavioral disturbance: Secondary | ICD-10-CM | POA: Diagnosis not present

## 2020-01-02 DIAGNOSIS — I480 Paroxysmal atrial fibrillation: Secondary | ICD-10-CM | POA: Diagnosis not present

## 2020-01-02 DIAGNOSIS — N183 Chronic kidney disease, stage 3 unspecified: Secondary | ICD-10-CM | POA: Diagnosis not present

## 2020-01-02 DIAGNOSIS — C50919 Malignant neoplasm of unspecified site of unspecified female breast: Secondary | ICD-10-CM | POA: Diagnosis not present

## 2020-01-02 DIAGNOSIS — Z79899 Other long term (current) drug therapy: Secondary | ICD-10-CM | POA: Diagnosis not present

## 2020-01-08 ENCOUNTER — Other Ambulatory Visit (HOSPITAL_COMMUNITY): Payer: Self-pay | Admitting: Internal Medicine

## 2020-01-08 DIAGNOSIS — Z1231 Encounter for screening mammogram for malignant neoplasm of breast: Secondary | ICD-10-CM

## 2020-01-09 DIAGNOSIS — E039 Hypothyroidism, unspecified: Secondary | ICD-10-CM | POA: Diagnosis not present

## 2020-01-09 DIAGNOSIS — R7309 Other abnormal glucose: Secondary | ICD-10-CM | POA: Diagnosis not present

## 2020-01-09 DIAGNOSIS — F3340 Major depressive disorder, recurrent, in remission, unspecified: Secondary | ICD-10-CM | POA: Diagnosis not present

## 2020-01-09 DIAGNOSIS — N1831 Chronic kidney disease, stage 3a: Secondary | ICD-10-CM | POA: Diagnosis not present

## 2020-01-09 DIAGNOSIS — E1122 Type 2 diabetes mellitus with diabetic chronic kidney disease: Secondary | ICD-10-CM | POA: Diagnosis not present

## 2020-01-09 DIAGNOSIS — I480 Paroxysmal atrial fibrillation: Secondary | ICD-10-CM | POA: Diagnosis not present

## 2020-01-09 DIAGNOSIS — I5032 Chronic diastolic (congestive) heart failure: Secondary | ICD-10-CM | POA: Diagnosis not present

## 2020-01-13 ENCOUNTER — Ambulatory Visit (HOSPITAL_COMMUNITY)
Admission: RE | Admit: 2020-01-13 | Discharge: 2020-01-13 | Disposition: A | Discharge status: Home / Self Care | Payer: Medicare PPO | Source: Ambulatory Visit | Attending: Internal Medicine | Admitting: Internal Medicine

## 2020-01-13 ENCOUNTER — Other Ambulatory Visit: Payer: Self-pay

## 2020-01-13 DIAGNOSIS — Z1231 Encounter for screening mammogram for malignant neoplasm of breast: Secondary | ICD-10-CM

## 2020-01-15 ENCOUNTER — Other Ambulatory Visit: Payer: Self-pay

## 2020-01-15 ENCOUNTER — Ambulatory Visit (INDEPENDENT_AMBULATORY_CARE_PROVIDER_SITE_OTHER): Payer: Medicare PPO | Admitting: *Deleted

## 2020-01-15 DIAGNOSIS — I639 Cerebral infarction, unspecified: Secondary | ICD-10-CM | POA: Diagnosis not present

## 2020-01-15 DIAGNOSIS — Z5181 Encounter for therapeutic drug level monitoring: Secondary | ICD-10-CM | POA: Diagnosis not present

## 2020-01-15 DIAGNOSIS — Z952 Presence of prosthetic heart valve: Secondary | ICD-10-CM | POA: Diagnosis not present

## 2020-01-15 DIAGNOSIS — I4891 Unspecified atrial fibrillation: Secondary | ICD-10-CM

## 2020-01-15 LAB — POCT INR: INR: 2.6 (ref 2.0–3.0)

## 2020-01-15 NOTE — Patient Instructions (Signed)
Continue warfarin 1 tablet daily except 1/2 tablets on Tuesdays   Recheck in 4 wks/'lr

## 2020-02-12 ENCOUNTER — Other Ambulatory Visit: Payer: Self-pay

## 2020-02-12 ENCOUNTER — Ambulatory Visit (INDEPENDENT_AMBULATORY_CARE_PROVIDER_SITE_OTHER): Payer: Medicare PPO | Admitting: *Deleted

## 2020-02-12 DIAGNOSIS — I639 Cerebral infarction, unspecified: Secondary | ICD-10-CM

## 2020-02-12 DIAGNOSIS — I4891 Unspecified atrial fibrillation: Secondary | ICD-10-CM

## 2020-02-12 DIAGNOSIS — Z5181 Encounter for therapeutic drug level monitoring: Secondary | ICD-10-CM

## 2020-02-12 LAB — POCT INR: INR: 1.5 — AB (ref 2.0–3.0)

## 2020-02-12 NOTE — Patient Instructions (Signed)
Take warfarin 2 tablets tonight and tomorrow night then increase dose to 1 tablet daily exceot 1 1/2 tablets on Sundays   Recheck in 10 days

## 2020-02-25 ENCOUNTER — Other Ambulatory Visit: Payer: Self-pay

## 2020-02-25 ENCOUNTER — Ambulatory Visit (INDEPENDENT_AMBULATORY_CARE_PROVIDER_SITE_OTHER): Payer: Medicare PPO | Admitting: *Deleted

## 2020-02-25 DIAGNOSIS — I4891 Unspecified atrial fibrillation: Secondary | ICD-10-CM | POA: Diagnosis not present

## 2020-02-25 DIAGNOSIS — I639 Cerebral infarction, unspecified: Secondary | ICD-10-CM

## 2020-02-25 DIAGNOSIS — Z5181 Encounter for therapeutic drug level monitoring: Secondary | ICD-10-CM | POA: Diagnosis not present

## 2020-02-25 LAB — POCT INR: INR: 1.8 — AB (ref 2.0–3.0)

## 2020-02-25 NOTE — Patient Instructions (Signed)
Take warfarin 2 tablets tonight and tomorrow night then increase dose to 1 1/2 tablets daily except 1 tablet on Mondays, Wednesdays and Fridays   Recheck in 10 days

## 2020-03-10 ENCOUNTER — Other Ambulatory Visit: Payer: Self-pay

## 2020-03-10 ENCOUNTER — Ambulatory Visit (INDEPENDENT_AMBULATORY_CARE_PROVIDER_SITE_OTHER): Payer: Medicare PPO | Admitting: *Deleted

## 2020-03-10 DIAGNOSIS — I4891 Unspecified atrial fibrillation: Secondary | ICD-10-CM

## 2020-03-10 DIAGNOSIS — Z5181 Encounter for therapeutic drug level monitoring: Secondary | ICD-10-CM | POA: Diagnosis not present

## 2020-03-10 DIAGNOSIS — I639 Cerebral infarction, unspecified: Secondary | ICD-10-CM

## 2020-03-10 DIAGNOSIS — Z952 Presence of prosthetic heart valve: Secondary | ICD-10-CM

## 2020-03-10 LAB — POCT INR: INR: 2.6 (ref 2.0–3.0)

## 2020-03-10 NOTE — Patient Instructions (Signed)
Continue warfarin 1 1/2 tablets daily except 1 tablet on Mondays, Wednesdays and Fridays Recheck in 4 wks  

## 2020-03-25 DIAGNOSIS — E1129 Type 2 diabetes mellitus with other diabetic kidney complication: Secondary | ICD-10-CM | POA: Diagnosis not present

## 2020-03-30 ENCOUNTER — Telehealth: Payer: Self-pay | Admitting: *Deleted

## 2020-03-30 NOTE — Telephone Encounter (Signed)
Spoke with spouse, Mr Byer.  States pt started developing a cold with bad cough yesterday.  Dr Willey Blade is starting pt on benzonatate. Possible interaction with warfarin.  States she is eating OK but not great. No fever.  Told to have pt continue current warfarin dose for now, try to drink plenty of fluids and eat as well as tolerated.  Has f/u INR check next week.  If pt starts to feel worse or is started on antibiotics they are to call back so I can see pt this week.  He verbalized understanding.

## 2020-04-01 ENCOUNTER — Encounter (HOSPITAL_COMMUNITY): Payer: Self-pay | Admitting: Emergency Medicine

## 2020-04-01 ENCOUNTER — Emergency Department (HOSPITAL_COMMUNITY)
Admission: EM | Admit: 2020-04-01 | Discharge: 2020-04-01 | Disposition: A | Discharge status: Home / Self Care | Payer: Medicare PPO | Attending: Emergency Medicine | Admitting: Emergency Medicine

## 2020-04-01 ENCOUNTER — Other Ambulatory Visit: Payer: Self-pay

## 2020-04-01 ENCOUNTER — Emergency Department (HOSPITAL_COMMUNITY): Payer: Medicare PPO

## 2020-04-01 ENCOUNTER — Ambulatory Visit (INDEPENDENT_AMBULATORY_CARE_PROVIDER_SITE_OTHER): Payer: Self-pay | Admitting: *Deleted

## 2020-04-01 DIAGNOSIS — Z20822 Contact with and (suspected) exposure to covid-19: Secondary | ICD-10-CM | POA: Insufficient documentation

## 2020-04-01 DIAGNOSIS — E039 Hypothyroidism, unspecified: Secondary | ICD-10-CM | POA: Insufficient documentation

## 2020-04-01 DIAGNOSIS — Z853 Personal history of malignant neoplasm of breast: Secondary | ICD-10-CM | POA: Insufficient documentation

## 2020-04-01 DIAGNOSIS — N181 Chronic kidney disease, stage 1: Secondary | ICD-10-CM | POA: Insufficient documentation

## 2020-04-01 DIAGNOSIS — R05 Cough: Secondary | ICD-10-CM | POA: Diagnosis not present

## 2020-04-01 DIAGNOSIS — I509 Heart failure, unspecified: Secondary | ICD-10-CM | POA: Diagnosis not present

## 2020-04-01 DIAGNOSIS — Z79899 Other long term (current) drug therapy: Secondary | ICD-10-CM | POA: Diagnosis not present

## 2020-04-01 DIAGNOSIS — N182 Chronic kidney disease, stage 2 (mild): Secondary | ICD-10-CM | POA: Diagnosis not present

## 2020-04-01 DIAGNOSIS — J90 Pleural effusion, not elsewhere classified: Secondary | ICD-10-CM | POA: Diagnosis not present

## 2020-04-01 DIAGNOSIS — Z5181 Encounter for therapeutic drug level monitoring: Secondary | ICD-10-CM

## 2020-04-01 DIAGNOSIS — I130 Hypertensive heart and chronic kidney disease with heart failure and stage 1 through stage 4 chronic kidney disease, or unspecified chronic kidney disease: Secondary | ICD-10-CM | POA: Insufficient documentation

## 2020-04-01 DIAGNOSIS — J45909 Unspecified asthma, uncomplicated: Secondary | ICD-10-CM | POA: Insufficient documentation

## 2020-04-01 DIAGNOSIS — Z952 Presence of prosthetic heart valve: Secondary | ICD-10-CM

## 2020-04-01 DIAGNOSIS — Z7901 Long term (current) use of anticoagulants: Secondary | ICD-10-CM | POA: Insufficient documentation

## 2020-04-01 DIAGNOSIS — Z954 Presence of other heart-valve replacement: Secondary | ICD-10-CM | POA: Insufficient documentation

## 2020-04-01 DIAGNOSIS — R059 Cough, unspecified: Secondary | ICD-10-CM

## 2020-04-01 DIAGNOSIS — I4891 Unspecified atrial fibrillation: Secondary | ICD-10-CM

## 2020-04-01 DIAGNOSIS — J209 Acute bronchitis, unspecified: Secondary | ICD-10-CM | POA: Diagnosis not present

## 2020-04-01 DIAGNOSIS — I517 Cardiomegaly: Secondary | ICD-10-CM | POA: Diagnosis not present

## 2020-04-01 LAB — CBC WITH DIFFERENTIAL/PLATELET
Basophils Absolute: 0.6 10*3/uL — ABNORMAL HIGH (ref 0.0–0.1)
Basophils Relative: 0 %
Eosinophils Absolute: 1.1 10*3/uL — ABNORMAL HIGH (ref 0.0–0.5)
Eosinophils Relative: 0 %
HCT: 40.5 % (ref 36.0–46.0)
Hemoglobin: 12.7 g/dL (ref 12.0–15.0)
Lymphocytes Relative: 2 %
Lymphs Abs: 16.7 10*3/uL — ABNORMAL HIGH (ref 0.7–4.0)
MCH: 30.4 pg (ref 26.0–34.0)
MCHC: 31.4 g/dL (ref 30.0–36.0)
MCV: 96.9 fL (ref 80.0–100.0)
Monocytes Absolute: 12.7 10*3/uL — ABNORMAL HIGH (ref 0.1–1.0)
Monocytes Relative: 1 %
Neutro Abs: 68.5 10*3/uL — ABNORMAL HIGH (ref 1.7–7.7)
Neutrophils Relative %: 6 %
Platelets: 168 10*3/uL (ref 150–400)
RBC: 4.18 MIL/uL (ref 3.87–5.11)
RDW: 13.8 % (ref 11.5–15.5)
WBC: 8.9 10*3/uL (ref 4.0–10.5)

## 2020-04-01 LAB — TROPONIN I (HIGH SENSITIVITY)
Troponin I (High Sensitivity): 13 ng/L (ref <18)
Troponin I (High Sensitivity): 13 ng/L (ref <18)

## 2020-04-01 LAB — BRAIN NATRIURETIC PEPTIDE: B Natriuretic Peptide: 134 pg/mL — ABNORMAL HIGH (ref 0.0–100.0)

## 2020-04-01 LAB — PROTIME-INR
INR: 2.3 — ABNORMAL HIGH (ref 0.8–1.2)
Prothrombin Time: 24.3 seconds — ABNORMAL HIGH (ref 11.4–15.2)

## 2020-04-01 LAB — BASIC METABOLIC PANEL
Anion gap: 12 (ref 5–15)
BUN: 10 mg/dL (ref 8–23)
CO2: 21 mmol/L — ABNORMAL LOW (ref 22–32)
Calcium: 8.6 mg/dL — ABNORMAL LOW (ref 8.9–10.3)
Chloride: 102 mmol/L (ref 98–111)
Creatinine, Ser: 1.19 mg/dL — ABNORMAL HIGH (ref 0.44–1.00)
Glucose, Bld: 122 mg/dL — ABNORMAL HIGH (ref 70–99)
Potassium: 4.1 mmol/L (ref 3.5–5.1)
Sodium: 135 mmol/L (ref 135–145)

## 2020-04-01 LAB — SARS CORONAVIRUS 2 BY RT PCR (HOSPITAL ORDER, PERFORMED IN ~~LOC~~ HOSPITAL LAB): SARS Coronavirus 2: NEGATIVE

## 2020-04-01 MED ORDER — DOXYCYCLINE HYCLATE 100 MG PO CAPS
100.0000 mg | ORAL_CAPSULE | Freq: Two times a day (BID) | ORAL | 0 refills | Status: DC
Start: 2020-04-01 — End: 2020-04-30

## 2020-04-01 MED ORDER — IPRATROPIUM-ALBUTEROL 0.5-2.5 (3) MG/3ML IN SOLN
3.0000 mL | Freq: Once | RESPIRATORY_TRACT | Status: AC
  Administered 2020-04-01: 3 mL via RESPIRATORY_TRACT
  Filled 2020-04-01 (×2): qty 3

## 2020-04-01 MED ORDER — PREDNISONE 50 MG PO TABS
ORAL_TABLET | ORAL | 0 refills | Status: DC
Start: 2020-04-01 — End: 2020-04-30

## 2020-04-01 MED ORDER — ACETAMINOPHEN 325 MG PO TABS
650.0000 mg | ORAL_TABLET | Freq: Once | ORAL | Status: AC
  Administered 2020-04-01: 650 mg via ORAL
  Filled 2020-04-01: qty 2

## 2020-04-01 MED ORDER — ALBUTEROL SULFATE HFA 108 (90 BASE) MCG/ACT IN AERS: 1.0000 | INHALATION_SPRAY | Freq: Four times a day (QID) | RESPIRATORY_TRACT | 0 refills | Status: DC | PRN

## 2020-04-01 MED ORDER — PREDNISONE 50 MG PO TABS
60.0000 mg | ORAL_TABLET | Freq: Once | ORAL | Status: AC
  Administered 2020-04-01: 60 mg via ORAL
  Filled 2020-04-01: qty 1

## 2020-04-01 MED ORDER — BENZONATATE 100 MG PO CAPS
100.0000 mg | ORAL_CAPSULE | Freq: Three times a day (TID) | ORAL | 0 refills | Status: DC
Start: 2020-04-01 — End: 2020-05-01

## 2020-04-01 MED ORDER — DOXYCYCLINE HYCLATE 100 MG PO TABS
100.0000 mg | ORAL_TABLET | Freq: Once | ORAL | Status: AC
  Administered 2020-04-01: 100 mg via ORAL
  Filled 2020-04-01: qty 1

## 2020-04-01 MED ORDER — ALBUTEROL SULFATE HFA 108 (90 BASE) MCG/ACT IN AERS
4.0000 | INHALATION_SPRAY | Freq: Once | RESPIRATORY_TRACT | Status: AC
  Administered 2020-04-01: 4 via RESPIRATORY_TRACT
  Filled 2020-04-01: qty 6.7

## 2020-04-01 MED ORDER — FUROSEMIDE 10 MG/ML IJ SOLN
40.0000 mg | Freq: Once | INTRAMUSCULAR | Status: AC
  Administered 2020-04-01: 40 mg via INTRAVENOUS
  Filled 2020-04-01: qty 4

## 2020-04-01 NOTE — ED Provider Notes (Signed)
[facility] EMERGENCY DEPARTMENT Provider Note   CSN: [id number] Arrival date & time: 04/01/20  0020     History Chief Complaint  Patient presents with   Cough    [name] is a 74 y.o. female.  Patient presents with a 1 week history of dry cough that is progressively worsening.  States she is not able to cough up anything.  She is been trying over-the-counter cough remedies as well as whiskey and honey without relief.  She comes in tonight because she could not rest because of coughing.  She denies any runny nose or sore throat.  No fever.  No chest pain.  No abdominal pain, nausea or vomiting.  Trace leg swelling at baseline is unchanged. Patient does have a history of asthma uses albuterol on a as needed basis only.  She does not normally take any medications for her asthma. She also has a history of aortic and mitral valve replacements on Coumadin, atrial fibrillation, CKD, previous stroke, hypertension.  Chart lists history of acid reflux disease but she denies this.  Does not take any ACE inhibitor's.  No sick contacts.  Did receive the Covid vaccine  The history is provided by the patient and the spouse.       Past Medical History:  Diagnosis Date   Anemia    Atrial fibrillation ([place])    Breast carcinoma ([place]) 1995   4709   Cardioembolic stroke ([place]) 03/2835   Right frontal in 01/2012; normal carotid ultrasound; possible LAA thrombus by TEE; virtual complete neurologic recovery   Chronic kidney disease, stage 2, mildly decreased GFR    GFR of approximately 60   Depression    Diverticulosis of colon (without mention of hemorrhage) 2012   Dr. Laural Golden   Fasting hyperglycemia    120 fasting   Gastroesophageal reflux disease    Gastroparesis    Hemorrhoids    Hyperlipidemia    Hypertension    pt denies 05/30/13     Dr Kathe Mariner   Hyponatremia    Rheumatic heart disease    a. 07/2013 Echo: Ef 55-60%, Mod AS/AI, Mod-Sev MS, sev dil LA, PASP  58;  b. 07/2013 TEE: EF 35-40%, mild-mod AS/AI, mod-sev MS, LA smoke;  c. 07/2013 Cath: elev R heart pressures, nl cors.   Shortness of breath     Patient Active Problem List   Diagnosis Date Noted   Asthma exacerbation 11/12/2016   Acute CHF (congestive heart failure) ([place]) 11/12/2016   Synovial cyst of lumbar spine 05/13/2014   Encounter for therapeutic drug monitoring 11/06/2013   Heart valve replaced by other means 08/05/2013   Hyponatremia    Atrial fibrillation (HCC)    Moderate aortic stenosis 07/12/2013   Moderate aortic insufficiency 07/12/2013   Severe mitral valve stenosis 07/09/2013   Mitral stenosis 07/08/2013   Acute respiratory failure with hypoxia ([place]) 07/07/2013   Acute pulmonary edema ([place]) 07/07/2013   Pleural effusion, bilateral 07/07/2013   Chest pain 07/07/2013   Asthma, cough variant 03/27/2013   Chronic cough 09/17/2012   Dyspnea on exertion 06/13/2012   Edema 06/13/2012   Rheumatic heart disease 06/13/2012   Fasting hyperglycemia    Chronic anticoagulation 01/26/2012   Hypothyroidism 62/94/7654   Cardioembolic stroke ([place]) 65/12/5463   Hyperlipidemia    Hypertension    Breast carcinoma ([place])    Gastroesophageal reflux disease     Past Surgical History:  Procedure Laterality Date   AORTIC VALVE REPLACEMENT N/A 07/18/2013   Procedure:  AORTIC VALVE REPLACEMENT (AVR);  Surgeon: Gaye Pollack, MD;  Location: [facility];  Service: Open Heart Surgery;  Laterality: N/A;   BACK SURGERY     BREAST LUMPECTOMY Right 1995   CARDIAC CATHETERIZATION     CARDIOVERSION N/A 07/26/2013   Procedure: CARDIOVERSION;  Surgeon: Fay Records, MD;  Location: [facility];  Service: Cardiovascular;  Laterality: N/A;   COLONOSCOPY  2012   Negative screening procedure   DILATION AND CURETTAGE OF UTERUS     ESOPHAGEAL MANOMETRY N/A 06/17/2013   Procedure: ESOPHAGEAL MANOMETRY (EM);  Surgeon: Sable Feil, MD;  Location: [facility] ENDOSCOPY;   Service: Endoscopy;  Laterality: N/A;   INTRAOPERATIVE TRANSESOPHAGEAL ECHOCARDIOGRAM N/A 07/18/2013   Procedure: INTRAOPERATIVE TRANSESOPHAGEAL ECHOCARDIOGRAM;  Surgeon: Gaye Pollack, MD;  Location: [facility] OR;  Service: Open Heart Surgery;  Laterality: N/A;   LEFT AND RIGHT HEART CATHETERIZATION WITH CORONARY ANGIOGRAM N/A 07/10/2013   Procedure: LEFT AND RIGHT HEART CATHETERIZATION WITH CORONARY ANGIOGRAM;  Surgeon: Burnell Blanks, MD;  Location: [facility] CATH LAB;  Service: Cardiovascular;  Laterality: N/A;   LUMBAR LAMINECTOMY/DECOMPRESSION MICRODISCECTOMY Right 05/13/2014   Procedure: LUMBAR LAMINECTOMY/DECOMPRESSION MICRODISCECTOMY 1 LEVEL  lumbar four/five;  Surgeon: Faythe Ghee, MD;  Location: [facility] NEURO ORS;  Service: Neurosurgery;  Laterality: Right;   MITRAL VALVE REPLACEMENT N/A 07/18/2013   Procedure: MITRAL VALVE (MV) REPLACEMENT;  Surgeon: Gaye Pollack, MD;  Location: [facility] OR;  Service: Open Heart Surgery;  Laterality: N/A;   MITRAL VALVE [place], closed mitral valvulotomy by finger fracture   TEE WITHOUT CARDIOVERSION  01/24/2012   Procedure: TRANSESOPHAGEAL ECHOCARDIOGRAM (TEE);  Surgeon: Lelon Perla, MD;  Location: [facility] ENDOSCOPY;  Service: Cardiovascular;  Laterality: N/A;   TEE WITHOUT CARDIOVERSION N/A 07/11/2013   Procedure: TRANSESOPHAGEAL ECHOCARDIOGRAM (TEE);  Surgeon: Thayer Headings, MD;  Location: [facility];  Service: Cardiovascular;  Laterality: N/A;   TEE WITHOUT CARDIOVERSION N/A 07/26/2013   Procedure: TRANSESOPHAGEAL ECHOCARDIOGRAM (TEE);  Surgeon: Fay Records, MD;  Location: [facility] ENDOSCOPY;  Service: Cardiovascular;  Laterality: N/A;   TUBAL LIGATION  1973     OB History   No obstetric history on file.     Family History  Problem Relation Age of Onset   Heart disease Brother 48       MI   Rheum arthritis Maternal Grandmother    Asthma Maternal Grandfather    Hypothyroidism Mother    Diabetes Sister    Cirrhosis  Father     Social History   Tobacco Use   Smoking status: Never Smoker   Smokeless tobacco: Never Used  Scientific laboratory technician Use: Never used  Substance Use Topics   Alcohol use: No    Alcohol/week: 0.0 standard drinks   Drug use: No    Home Medications Prior to Admission medications   Medication Sig Start Date End Date Taking? Authorizing Provider  albuterol (VENTOLIN HFA) 108 (90 BASE) MCG/ACT inhaler Inhale 2 puffs into the lungs every 4 (four) hours as needed for wheezing or shortness of breath.     [provider]  atorvastatin (LIPITOR) 40 MG tablet TAKE ONE TABLET BY MOUTH ONCE DAILY. 04/29/19   Arnoldo Lenis, MD  diltiazem (CARDIZEM CD) 180 MG 24 hr capsule Take 1 capsule (180 mg total) by mouth daily. 08/03/13   Collins, Gina L, PA-C  escitalopram (LEXAPRO) 10 MG tablet Take 10 mg by mouth daily.  04/10/15   [provider]  fexofenadine (ALLEGRA) 30  MG tablet Take 30 mg by mouth daily.     [provider]  FLOVENT HFA 44 MCG/ACT inhaler Inhale 2 puffs into the lungs 2 (two) times daily.  04/24/18   [provider]  fluticasone (FLONASE) 50 MCG/ACT nasal spray Place 1 spray into both nostrils daily as needed.  09/05/13   Arnoldo Lenis, MD  furosemide (LASIX) 40 MG tablet TAKE ONE TABLET BY MOUTH ONCE DAILY.MAY TAKE AN ADDITIONAL TABLET AT BEDTIME AS NEEDED FOR SWELLING. 12/19/19   Arnoldo Lenis, MD  levothyroxine (SYNTHROID, LEVOTHROID) 112 MCG tablet Take 1 tablet by mouth daily. Pt taking 100 mcg. 10/17/13   [provider]  metFORMIN (GLUCOPHAGE) 500 MG tablet Take 1 tablet (500 mg total) by mouth 2 (two) times daily with a meal. 08/03/13   Collins, Gina L, PA-C  metoprolol tartrate (LOPRESSOR) 25 MG tablet TAKE (1) TABLET BY MOUTH TWICE DAILY. 05/01/19   Arnoldo Lenis, MD  Multiple Vitamin (MULITIVITAMIN WITH MINERALS) TABS Take 1 tablet by mouth daily.    [provider]  olmesartan (BENICAR) 20 MG tablet  Take 0.5 tablets (10 mg total) by mouth daily. 05/29/18   Arnoldo Lenis, MD  potassium chloride SA (K-DUR,KLOR-CON) 20 MEQ tablet Take 20 mEq by mouth daily. 08/14/13   Gaye Pollack, MD  warfarin (COUMADIN) 2.5 MG tablet TAKE AS DIRECTED BY COUMADIN CLINIC. 01/01/20   Arnoldo Lenis, MD    Allergies    Amiodarone  Review of Systems   Review of Systems  Constitutional: Negative for activity change, appetite change and fever.  HENT: Negative for congestion and rhinorrhea.   Respiratory: Positive for cough and shortness of breath. Negative for chest tightness.   Cardiovascular: Negative for chest pain and leg swelling.  Gastrointestinal: Negative for abdominal pain, nausea and vomiting.  Genitourinary: Negative for dysuria and hematuria.  Musculoskeletal: Negative for arthralgias and myalgias.  Skin: Negative for rash.  Neurological: Negative for dizziness, weakness and headaches.   all other systems are negative except as noted in the HPI and PMH.    Physical Exam Updated Vital Signs BP 136/74    Pulse 77    Temp 98.9 F (37.2 C) (Oral)    Resp 17    Ht 5\' 2"  (1.575 m)    Wt 81.6 kg    SpO2 98%    BMI 32.92 kg/m   Physical Exam Vitals and nursing note reviewed.  Constitutional:      General: She is not in acute distress.    Appearance: She is well-developed.     Comments: Dry cough  HENT:     Head: Normocephalic and atraumatic.     Mouth/Throat:     Pharynx: No oropharyngeal exudate.  Eyes:     Conjunctiva/sclera: Conjunctivae normal.     Pupils: Pupils are equal, round, and reactive to light.  Neck:     Comments: No meningismus. Cardiovascular:     Rate and Rhythm: Normal rate and regular rhythm.     Heart sounds: Normal heart sounds. No murmur heard.   Pulmonary:     Effort: Pulmonary effort is normal. No respiratory distress.     Breath sounds: Wheezing present.     Comments: No increased work of breathing, good air exchange, scattered expiratory  wheeze Abdominal:     Palpations: Abdomen is soft.     Tenderness: There is no abdominal tenderness. There is no guarding or rebound.  Musculoskeletal:        General:  No tenderness. Normal range of motion.     Cervical back: Normal range of motion and neck supple.     Right lower leg: Edema present.     Left lower leg: Edema present.     Comments: Trace pedal edema bilaterally  Skin:    General: Skin is warm.  Neurological:     Mental Status: She is alert and oriented to person, place, and time.     Cranial Nerves: No cranial nerve deficit.     Motor: No abnormal muscle tone.     Coordination: Coordination normal.     Comments:  5/5 strength throughout. CN 2-12 intact.Equal grip strength.   Psychiatric:        Behavior: Behavior normal.     ED Results / Procedures / Treatments   Labs (all labs ordered are listed, but only abnormal results are displayed) Labs Reviewed  CBC WITH DIFFERENTIAL/PLATELET - Abnormal; Notable for the following components:      Result Value   Neutro Abs 68.5 (*)    Lymphs Abs 16.7 (*)    Monocytes Absolute 12.7 (*)    Eosinophils Absolute 1.1 (*)    Basophils Absolute 0.6 (*)    All other components within normal limits  BASIC METABOLIC PANEL - Abnormal; Notable for the following components:   CO2 21 (*)    Glucose, Bld 122 (*)    Creatinine, Ser 1.19 (*)    Calcium 8.6 (*)    All other components within normal limits  PROTIME-INR - Abnormal; Notable for the following components:   Prothrombin Time 24.3 (*)    INR 2.3 (*)    All other components within normal limits  BRAIN NATRIURETIC PEPTIDE - Abnormal; Notable for the following components:   B Natriuretic Peptide 134.0 (*)    All other components within normal limits  SARS CORONAVIRUS 2 BY RT PCR (HOSPITAL ORDER, [facility])  TROPONIN I (HIGH SENSITIVITY)  TROPONIN I (HIGH SENSITIVITY)    EKG EKG Interpretation  Date/Time:  Wednesday April 01 2020 01:18:45  EDT Ventricular Rate:  84 PR Interval:    QRS Duration: 90 QT Interval:  333 QTC Calculation: 394 R Axis:   96 Text Interpretation: Atrial fibrillation Right axis deviation Low voltage, precordial leads Borderline repolarization abnormality now atrial fibrillation Nonspecific ST abnormality Confirmed by Ezequiel Essex [phone]) on 04/01/2020 1:26:03 AM   Radiology DG Chest Portable 1 View  Result Date: 04/01/2020 CLINICAL DATA:  Cough for 1 week. EXAM: PORTABLE CHEST 1 VIEW COMPARISON:  02/20/2017 FINDINGS: Post median sternotomy. Two prosthetic cardiac valves. Stable cardiomegaly. There is vascular congestion. Improved interstitial prominence from prior exam. No focal airspace disease. Blunting of the costophrenic angles may represent small effusions. There is biapical pleuroparenchymal scarring. No pneumothorax. Surgical clips project over the right axilla and chest wall. IMPRESSION: 1. Stable cardiomegaly with vascular congestion. 2. Possible small pleural effusions. Electronically Signed   By: Keith Rake M.D.   On: 04/01/2020 01:25    Procedures Procedures (including critical care time)  Medications Ordered in ED Medications - No data to display  ED Course  I have reviewed the triage vital signs and the nursing notes.  Pertinent labs & imaging results that were available during my care of the patient were reviewed by me and considered in my medical decision making (see chart for details).    MDM Rules/Calculators/A&P  1 week of dry cough.  No chest pain.  No hypoxia or increased work of breathing.  She has expiratory wheezes on exam.  Chest x-ray shows cardiomegaly with vascular congestion and small pleural effusions.  She is given a dose of IV Lasix.  Last echocardiogram showed normal EF of 60 to 65%. Labs are reassuring.  INR slightly low at 2.3.  Patient able to ambulate without desaturation.  On recheck, patient has more wheezing.  She is  complaining of a headache.  Denies falling or hitting her head.  Denies thunderclap onset.  She will be given a DuoNeb now that her Covid test is negative.  Also received steroids and antibiotics.  Patient improved after these measures feels ready to go home.  Denies any chest pain.  She is ambulatory without desaturation.  She has fewer wheezes on reassessment.  We will treat for bronchitis with steroids, albuterol, doxycycline.  Advised to watch her blood sugars closely while she is taking steroids.  Also let her Coumadin clinic know that her INR is slightly low at 2.3.  May need medication adjustment due to being on doxycycline and steroids as well.  Troponin negative x2.  No chest pain.  No increased oxygen requirement.  Follow-up with PCP.  Return precautions discussed.  [name] was evaluated in Emergency Department on 04/01/2020 for the symptoms described in the history of present illness. She was evaluated in the context of the global COVID-19 pandemic, which necessitated consideration that the patient might be at risk for infection with the SARS-CoV-2 virus that causes COVID-19. Institutional protocols and algorithms that pertain to the evaluation of patients at risk for COVID-19 are in a state of rapid change based on information released by regulatory bodies including the CDC and federal and state organizations. These policies and algorithms were followed during the patient's care in the ED.   Final Clinical Impression(s) / ED Diagnoses Final diagnoses:  Cough  Acute bronchitis, unspecified organism    Rx / DC Orders ED Discharge Orders    None       Sohaib Vereen, Annie Main, MD 04/01/20 [phone]

## 2020-04-01 NOTE — Discharge Instructions (Addendum)
Take the antibiotics and steroids as prescribed.  Use albuterol as needed for difficulty breathing and 6 hours.  Follow-up with your Coumadin clinic.  Let them know your INR today was 2.3.  Tell them you are also taking prednisone and doxycycline which may affect your INR.  Follow-up with your doctor.  Return to the ED with difficulty breathing, chest pain, fever, or other concerns.

## 2020-04-01 NOTE — Patient Instructions (Signed)
INR in ED today 2.3.  Started on Doxycycline 100mg  bid x 10 days (finish 7/10) and prednisone 50mg  1 daily x 5 days (finish 7/5) Decrease warfarin to 1 tablet daily until INR check on 7/7 Called husband with instructions.  States pt says she is not going to take doxycycline because she doesn't want a yeast infections.  Advised bronchitis may not improve without Abx.  He verbalized understanding.

## 2020-04-01 NOTE — ED Notes (Signed)
Pt ambulated to br no assistance

## 2020-04-01 NOTE — ED Notes (Signed)
Pt O2 stayed at 94% while ambulating

## 2020-04-01 NOTE — ED Triage Notes (Signed)
Pt c/o dry cough x one week that has gotten worse in last 24 hours. Pt states she urinates on herself when she coughs.

## 2020-04-03 ENCOUNTER — Ambulatory Visit
Admission: EM | Admit: 2020-04-03 | Discharge: 2020-04-03 | Disposition: A | Discharge status: Home / Self Care | Payer: Medicare PPO

## 2020-04-03 ENCOUNTER — Other Ambulatory Visit: Payer: Self-pay

## 2020-04-03 ENCOUNTER — Encounter: Payer: Self-pay | Admitting: Emergency Medicine

## 2020-04-03 DIAGNOSIS — J208 Acute bronchitis due to other specified organisms: Secondary | ICD-10-CM

## 2020-04-03 DIAGNOSIS — R05 Cough: Secondary | ICD-10-CM | POA: Diagnosis not present

## 2020-04-03 DIAGNOSIS — R059 Cough, unspecified: Secondary | ICD-10-CM

## 2020-04-03 NOTE — ED Provider Notes (Signed)
[place]   [id number] 04/03/20 Arrival Time: 1937   Chief Complaint  Patient presents with  . Cough     SUBJECTIVE: History from: patient.  [name] is a 74 y.o. female who presents to the urgent care with a complaint of cough for the past 1 week.  Patient was seen at the urgent care on 04/01/2020 and was prescribed Tessalon, doxycycline, and a steroid.  Reports symptom has improved except cough.  Denies sick exposure to COVID, flu or strep.  Denies recent travel.  Denies previous symptoms in the past.   Denies fever, chills, fatigue, sinus pain, rhinorrhea, sore throat, SOB, wheezing, chest pain, nausea, changes in bowel or bladder habits.       ROS: As per HPI.  All other pertinent ROS negative.      Past Medical History:  Diagnosis Date  . Anemia   . Atrial fibrillation ([place])   . Breast carcinoma ([place]) 1995   1995  . Cardioembolic stroke ([place]) 06/239   Right frontal in 01/2012; normal carotid ultrasound; possible LAA thrombus by TEE; virtual complete neurologic recovery  . Chronic kidney disease, stage 2, mildly decreased GFR    GFR of approximately 60  . Depression   . Diverticulosis of colon (without mention of hemorrhage) 2012   Dr. Laural Golden  . Fasting hyperglycemia    120 fasting  . Gastroesophageal reflux disease   . Gastroparesis   . Hemorrhoids   . Hyperlipidemia   . Hypertension    pt denies 05/30/13     Dr Kathe Mariner  . Hyponatremia   . Rheumatic heart disease    a. 07/2013 Echo: Ef 55-60%, Mod AS/AI, Mod-Sev MS, sev dil LA, PASP 58;  b. 07/2013 TEE: EF 35-40%, mild-mod AS/AI, mod-sev MS, LA smoke;  c. 07/2013 Cath: elev R heart pressures, nl cors.  . Shortness of breath    Past Surgical History:  Procedure Laterality Date  . AORTIC VALVE REPLACEMENT N/A 07/18/2013   Procedure: AORTIC VALVE REPLACEMENT (AVR);  Surgeon: Gaye Pollack, MD;  Location: [place];  Service: Open Heart Surgery;  Laterality: N/A;  . BACK SURGERY    .  BREAST LUMPECTOMY Right 1995  . CARDIAC CATHETERIZATION    . CARDIOVERSION N/A 07/26/2013   Procedure: CARDIOVERSION;  Surgeon: Fay Records, MD;  Location[facility] ENDOSCOPY;  Service: Cardiovascular;  Laterality: N/A;  . COLONOSCOPY  2012   Negative screening procedure  . DILATION AND CURETTAGE OF UTERUS    . ESOPHAGEAL MANOMETRY N/A 06/17/2013   Procedure: ESOPHAGEAL MANOMETRY (EM);  Surgeon: Sable Feil, MD;  Location: [facility] ENDOSCOPY;  Service: Endoscopy;  Laterality: N/A;  . INTRAOPERATIVE TRANSESOPHAGEAL ECHOCARDIOGRAM N/A 07/18/2013   Procedure: INTRAOPERATIVE TRANSESOPHAGEAL ECHOCARDIOGRAM;  Surgeon: Gaye Pollack, MD;  Location: [place] OR;  Service: Open Heart Surgery;  Laterality: N/A;  . LEFT AND RIGHT HEART CATHETERIZATION WITH CORONARY ANGIOGRAM N/A 07/10/2013   Procedure: LEFT AND RIGHT HEART CATHETERIZATION WITH CORONARY ANGIOGRAM;  Surgeon: Burnell Blanks, MD;  Location: [facility] CATH LAB;  Service: Cardiovascular;  Laterality: N/A;  . LUMBAR LAMINECTOMY/DECOMPRESSION MICRODISCECTOMY Right 05/13/2014   Procedure: LUMBAR LAMINECTOMY/DECOMPRESSION MICRODISCECTOMY 1 LEVEL  lumbar four/five;  Surgeon: Faythe Ghee, MD;  Location: [facility] NEURO ORS;  Service: Neurosurgery;  Laterality: Right;  . MITRAL VALVE REPLACEMENT N/A 07/18/2013   Procedure: MITRAL VALVE (MV) REPLACEMENT;  Surgeon: Gaye Pollack, MD;  Location: [facility];  Service: Open Heart Surgery;  Laterality: N/A;  . MITRAL VALVE SURGERY  [place], closed mitral valvulotomy by finger fracture  . TEE WITHOUT CARDIOVERSION  01/24/2012   Procedure: TRANSESOPHAGEAL ECHOCARDIOGRAM (TEE);  Surgeon: Lelon Perla, MD;  Location: [facility] ENDOSCOPY;  Service: Cardiovascular;  Laterality: N/A;  . TEE WITHOUT CARDIOVERSION N/A 07/11/2013   Procedure: TRANSESOPHAGEAL ECHOCARDIOGRAM (TEE);  Surgeon: Thayer Headings, MD;  Location: [place];  Service: Cardiovascular;  Laterality: N/A;  . TEE WITHOUT CARDIOVERSION N/A 07/26/2013    Procedure: TRANSESOPHAGEAL ECHOCARDIOGRAM (TEE);  Surgeon: Fay Records, MD;  Location: [place];  Service: Cardiovascular;  Laterality: N/A;  . TUBAL LIGATION  1973   Allergies  Allergen Reactions  . Amiodarone Rash   No current facility-administered medications on file prior to encounter.   Current Outpatient Medications on File Prior to Encounter  Medication Sig Dispense Refill  . albuterol (VENTOLIN HFA) 108 (90 BASE) MCG/ACT inhaler Inhale 2 puffs into the lungs every 4 (four) hours as needed for wheezing or shortness of breath.     Marland Kitchen albuterol (VENTOLIN HFA) 108 (90 Base) MCG/ACT inhaler Inhale 1-2 puffs into the lungs every 6 (six) hours as needed for wheezing or shortness of breath. 6.7 g 0  . atorvastatin (LIPITOR) 40 MG tablet TAKE ONE TABLET BY MOUTH ONCE DAILY. 90 tablet 3  . benzonatate (TESSALON) 100 MG capsule Take 1 capsule (100 mg total) by mouth every 8 (eight) hours. 21 capsule 0  . diltiazem (CARDIZEM CD) 180 MG 24 hr capsule Take 1 capsule (180 mg total) by mouth daily. 30 capsule 1  . doxycycline (VIBRAMYCIN) 100 MG capsule Take 1 capsule (100 mg total) by mouth 2 (two) times daily. 20 capsule 0  . escitalopram (LEXAPRO) 10 MG tablet Take 10 mg by mouth daily.     . fexofenadine (ALLEGRA) 30 MG tablet Take 30 mg by mouth daily.     Marland Kitchen FLOVENT HFA 44 MCG/ACT inhaler Inhale 2 puffs into the lungs 2 (two) times daily.     . fluticasone (FLONASE) 50 MCG/ACT nasal spray Place 1 spray into both nostrils daily as needed.     . furosemide (LASIX) 40 MG tablet TAKE ONE TABLET BY MOUTH ONCE DAILY.MAY TAKE AN ADDITIONAL TABLET AT BEDTIME AS NEEDED FOR SWELLING. 180 tablet 0  . levothyroxine (SYNTHROID, LEVOTHROID) 112 MCG tablet Take 1 tablet by mouth daily. Pt taking 100 mcg.    . metFORMIN (GLUCOPHAGE) 500 MG tablet Take 1 tablet (500 mg total) by mouth 2 (two) times daily with a meal. 60 tablet 1  . metoprolol tartrate (LOPRESSOR) 25 MG tablet TAKE (1) TABLET BY MOUTH TWICE  DAILY. 180 tablet 3  . Multiple Vitamin (MULITIVITAMIN WITH MINERALS) TABS Take 1 tablet by mouth daily.    Marland Kitchen olmesartan (BENICAR) 20 MG tablet Take 0.5 tablets (10 mg total) by mouth daily. 45 tablet 3  . potassium chloride SA (K-DUR,KLOR-CON) 20 MEQ tablet Take 20 mEq by mouth daily.    . predniSONE (DELTASONE) 50 MG tablet One tablet PO daily 5 tablet 0  . warfarin (COUMADIN) 2.5 MG tablet TAKE AS DIRECTED BY COUMADIN CLINIC. 45 tablet 6   Social History   Socioeconomic History  . Marital status: Married    Spouse name: Not on file  . Number of children: 1  . Years of education: Not on file  . Highest education level: Not on file  Occupational History  . Occupation: Retired Product manager: RETIRED  Tobacco Use  . Smoking status: Never Smoker  . Smokeless tobacco: Never  Used  Vaping Use  . Vaping Use: Never used  Substance and Sexual Activity  . Alcohol use: No    Alcohol/week: 0.0 standard drinks  . Drug use: No  . Sexual activity: Not Currently  Other Topics Concern  . Not on file  Social History Narrative   Married with children   No regular exercise   Social Determinants of Health   Financial Resource Strain:   . Difficulty of Paying Living Expenses:   Food Insecurity:   . Worried About Charity fundraiser in the Last Year:   . Arboriculturist in the Last Year:   Transportation Needs:   . Film/video editor (Medical):   Marland Kitchen Lack of Transportation (Non-Medical):   Physical Activity:   . Days of Exercise per Week:   . Minutes of Exercise per Session:   Stress:   . Feeling of Stress :   Social Connections:   . Frequency of Communication with Friends and Family:   . Frequency of Social Gatherings with Friends and Family:   . Attends Religious Services:   . Active Member of Clubs or Organizations:   . Attends Archivist Meetings:   Marland Kitchen Marital Status:   Intimate Partner Violence:   . Fear of Current or Ex-Partner:   . Emotionally Abused:   Marland Kitchen  Physically Abused:   . Sexually Abused:    Family History  Problem Relation Age of Onset  . Heart disease Brother 39       MI  . Rheum arthritis Maternal Grandmother   . Asthma Maternal Grandfather   . Hypothyroidism Mother   . Diabetes Sister   . Cirrhosis Father     OBJECTIVE:  Vitals:   04/03/20 1624 04/03/20 1630  BP: 120/63   Pulse: 90   Resp: 17   Temp: 98.5 F (36.9 C)   TempSrc: Oral   SpO2: 96%   Weight:  178 lb 9.2 oz (81 kg)  Height:  5\' 2"  (1.575 m)     Physical Exam Vitals and nursing note reviewed.  Constitutional:      General: She is not in acute distress.    Appearance: Normal appearance. She is normal weight. She is not ill-appearing, toxic-appearing or diaphoretic.  HENT:     Head: Normocephalic.     Right Ear: Tympanic membrane, ear canal and external ear normal. There is no impacted cerumen.     Left Ear: Tympanic membrane, ear canal and external ear normal. There is no impacted cerumen.     Nose: Nose normal. No congestion.     Mouth/Throat:     Mouth: Mucous membranes are moist.     Pharynx: No oropharyngeal exudate or posterior oropharyngeal erythema.  Cardiovascular:     Rate and Rhythm: Normal rate and regular rhythm.     Pulses: Normal pulses.     Heart sounds: Normal heart sounds. No murmur heard.  No friction rub. No gallop.   Pulmonary:     Effort: Pulmonary effort is normal. No respiratory distress.     Breath sounds: Normal breath sounds. No stridor. No wheezing, rhonchi or rales.  Chest:     Chest wall: No tenderness.  Neurological:     Mental Status: She is alert.     LABS:  No results found for this or any previous visit (from the past 24 hour(s)).   ASSESSMENT & PLAN:  1. Cough   2. Acute bronchitis due to other specified organisms  No orders of the defined types were placed in this encounter.  Patient is stable at discharge.  I was unable to prescribe Hydromet as imprivata system was down.  IT was called.   Patient was advised to go to her pharmacy and ask for codeine cough syrup with her ID.  Discharge instructions Was advised to continue to take Tessalon, doxycycline and Albuterol as prescribed and to completion Follow-up with PCP for reevaluation Return or go to ED for worsening of symptoms    Reviewed expectations re: course of current medical issues. Questions answered. Outlined signs and symptoms indicating need for more acute intervention. Patient verbalized understanding. After Visit Summary given.         Emerson Monte, [place] 04/03/20 1736

## 2020-04-03 NOTE — Discharge Instructions (Addendum)
Was advised to continue to take to Doxycycline, Tessalon and albuterol as prescribed and to completion Follow-up with PCP for reevaluation Return or go to ED for worsening of symptoms

## 2020-04-03 NOTE — ED Triage Notes (Signed)
Cough since for over a week, seen at ED on 06/30 given abx, steroids.pt still doesn't feel any better.

## 2020-04-04 ENCOUNTER — Telehealth: Payer: Self-pay | Admitting: Emergency Medicine

## 2020-04-04 NOTE — Telephone Encounter (Signed)
Call patient this morning and she states she was able to get a codeine cough syrup at a local pharmacy with her ID.  Provider was unable to send Hydromet as imprivata system was down.  IT was called yesterday (has not heard back from them yet).

## 2020-04-07 DIAGNOSIS — E785 Hyperlipidemia, unspecified: Secondary | ICD-10-CM | POA: Diagnosis not present

## 2020-04-07 DIAGNOSIS — I5032 Chronic diastolic (congestive) heart failure: Secondary | ICD-10-CM | POA: Diagnosis not present

## 2020-04-07 DIAGNOSIS — I480 Paroxysmal atrial fibrillation: Secondary | ICD-10-CM | POA: Diagnosis not present

## 2020-04-07 DIAGNOSIS — N183 Chronic kidney disease, stage 3 unspecified: Secondary | ICD-10-CM | POA: Diagnosis not present

## 2020-04-08 ENCOUNTER — Ambulatory Visit (INDEPENDENT_AMBULATORY_CARE_PROVIDER_SITE_OTHER): Payer: Medicare PPO | Admitting: *Deleted

## 2020-04-08 DIAGNOSIS — I639 Cerebral infarction, unspecified: Secondary | ICD-10-CM

## 2020-04-08 DIAGNOSIS — I4891 Unspecified atrial fibrillation: Secondary | ICD-10-CM

## 2020-04-08 DIAGNOSIS — Z5181 Encounter for therapeutic drug level monitoring: Secondary | ICD-10-CM

## 2020-04-08 LAB — POCT INR: INR: 2.4 (ref 2.0–3.0)

## 2020-04-08 NOTE — Patient Instructions (Signed)
Restart warfarin 1 1/2 tablets daily except 1 tablet on Mondays, Wednesdays and Fridays Will finish doxycycline on Fridays Recheck in 2 wks

## 2020-04-13 DIAGNOSIS — Z6833 Body mass index (BMI) 33.0-33.9, adult: Secondary | ICD-10-CM | POA: Diagnosis not present

## 2020-04-13 DIAGNOSIS — R05 Cough: Secondary | ICD-10-CM | POA: Diagnosis not present

## 2020-04-13 DIAGNOSIS — E1129 Type 2 diabetes mellitus with other diabetic kidney complication: Secondary | ICD-10-CM | POA: Diagnosis not present

## 2020-04-13 DIAGNOSIS — I5032 Chronic diastolic (congestive) heart failure: Secondary | ICD-10-CM | POA: Diagnosis not present

## 2020-04-22 ENCOUNTER — Ambulatory Visit (INDEPENDENT_AMBULATORY_CARE_PROVIDER_SITE_OTHER): Payer: Medicare PPO | Admitting: *Deleted

## 2020-04-22 DIAGNOSIS — I639 Cerebral infarction, unspecified: Secondary | ICD-10-CM | POA: Diagnosis not present

## 2020-04-22 DIAGNOSIS — I4891 Unspecified atrial fibrillation: Secondary | ICD-10-CM

## 2020-04-22 DIAGNOSIS — Z5181 Encounter for therapeutic drug level monitoring: Secondary | ICD-10-CM | POA: Diagnosis not present

## 2020-04-22 DIAGNOSIS — Z952 Presence of prosthetic heart valve: Secondary | ICD-10-CM

## 2020-04-22 LAB — POCT INR: INR: 3.6 — AB (ref 2.0–3.0)

## 2020-04-22 NOTE — Patient Instructions (Signed)
Decrease warfarin to 1 tablet daily except 1 1/2 tablets on Sundays, Tuesdays and Thursdays Recheck in 4 wks

## 2020-04-27 ENCOUNTER — Telehealth: Payer: Self-pay | Admitting: *Deleted

## 2020-04-27 NOTE — Telephone Encounter (Signed)
Mr. Arment called stating that PCP put patient on a cough medication (Cefdinir)  Wanting to know if this will interfere with coumdin.Marland Kitchen [phone]

## 2020-04-27 NOTE — Telephone Encounter (Signed)
Spoke with pt and spouse.  Pt is starting Cefdinir 300mg  twice daily x 5 days for cough.  Cefdinir can cause increase in INR.  Told pt to decrease warfarin to 2.5mg  daily through Friday, take 1.25mg  on Saturday.  On Sunday 8/1 resume normal dose of 2.5mg  daily except 3.75mg  on Sundays, Tuesdays and Thursdays.  Appt made to recheck INR on 05/06/20.  Pt and spouse verbalized understanding.

## 2020-04-29 ENCOUNTER — Other Ambulatory Visit: Payer: Self-pay | Admitting: Cardiology

## 2020-04-30 ENCOUNTER — Observation Stay (HOSPITAL_COMMUNITY)
Admission: EM | Admit: 2020-04-30 | Discharge: 2020-05-01 | Disposition: A | Discharge status: Home / Self Care | Payer: Medicare PPO | Attending: Family Medicine | Admitting: Family Medicine

## 2020-04-30 ENCOUNTER — Emergency Department (HOSPITAL_COMMUNITY): Payer: Medicare PPO

## 2020-04-30 ENCOUNTER — Encounter (HOSPITAL_COMMUNITY): Payer: Self-pay | Admitting: Emergency Medicine

## 2020-04-30 DIAGNOSIS — E1122 Type 2 diabetes mellitus with diabetic chronic kidney disease: Secondary | ICD-10-CM | POA: Diagnosis not present

## 2020-04-30 DIAGNOSIS — Z79899 Other long term (current) drug therapy: Secondary | ICD-10-CM | POA: Insufficient documentation

## 2020-04-30 DIAGNOSIS — Z853 Personal history of malignant neoplasm of breast: Secondary | ICD-10-CM | POA: Insufficient documentation

## 2020-04-30 DIAGNOSIS — Z20822 Contact with and (suspected) exposure to covid-19: Secondary | ICD-10-CM | POA: Diagnosis not present

## 2020-04-30 DIAGNOSIS — R55 Syncope and collapse: Secondary | ICD-10-CM | POA: Diagnosis not present

## 2020-04-30 DIAGNOSIS — R42 Dizziness and giddiness: Secondary | ICD-10-CM | POA: Diagnosis not present

## 2020-04-30 DIAGNOSIS — I509 Heart failure, unspecified: Secondary | ICD-10-CM | POA: Diagnosis not present

## 2020-04-30 DIAGNOSIS — G319 Degenerative disease of nervous system, unspecified: Secondary | ICD-10-CM | POA: Diagnosis not present

## 2020-04-30 DIAGNOSIS — Z7984 Long term (current) use of oral hypoglycemic drugs: Secondary | ICD-10-CM | POA: Insufficient documentation

## 2020-04-30 DIAGNOSIS — I639 Cerebral infarction, unspecified: Secondary | ICD-10-CM | POA: Diagnosis present

## 2020-04-30 DIAGNOSIS — I130 Hypertensive heart and chronic kidney disease with heart failure and stage 1 through stage 4 chronic kidney disease, or unspecified chronic kidney disease: Secondary | ICD-10-CM | POA: Insufficient documentation

## 2020-04-30 DIAGNOSIS — T671XXA Heat syncope, initial encounter: Secondary | ICD-10-CM | POA: Diagnosis not present

## 2020-04-30 DIAGNOSIS — R000 Tachycardia, unspecified: Secondary | ICD-10-CM | POA: Diagnosis not present

## 2020-04-30 DIAGNOSIS — W19XXXA Unspecified fall, initial encounter: Secondary | ICD-10-CM | POA: Diagnosis not present

## 2020-04-30 DIAGNOSIS — N182 Chronic kidney disease, stage 2 (mild): Secondary | ICD-10-CM | POA: Diagnosis not present

## 2020-04-30 DIAGNOSIS — T679XXA Effect of heat and light, unspecified, initial encounter: Secondary | ICD-10-CM | POA: Diagnosis not present

## 2020-04-30 DIAGNOSIS — I4891 Unspecified atrial fibrillation: Secondary | ICD-10-CM | POA: Diagnosis present

## 2020-04-30 DIAGNOSIS — Z7901 Long term (current) use of anticoagulants: Secondary | ICD-10-CM

## 2020-04-30 DIAGNOSIS — J45909 Unspecified asthma, uncomplicated: Secondary | ICD-10-CM | POA: Diagnosis not present

## 2020-04-30 DIAGNOSIS — E039 Hypothyroidism, unspecified: Secondary | ICD-10-CM | POA: Diagnosis present

## 2020-04-30 DIAGNOSIS — I959 Hypotension, unspecified: Secondary | ICD-10-CM | POA: Diagnosis not present

## 2020-04-30 DIAGNOSIS — S0990XA Unspecified injury of head, initial encounter: Secondary | ICD-10-CM | POA: Diagnosis not present

## 2020-04-30 DIAGNOSIS — I6782 Cerebral ischemia: Secondary | ICD-10-CM | POA: Diagnosis not present

## 2020-04-30 DIAGNOSIS — I10 Essential (primary) hypertension: Secondary | ICD-10-CM | POA: Diagnosis present

## 2020-04-30 DIAGNOSIS — I6389 Other cerebral infarction: Secondary | ICD-10-CM | POA: Diagnosis not present

## 2020-04-30 LAB — PROTIME-INR
INR: 2 — ABNORMAL HIGH (ref 0.8–1.2)
Prothrombin Time: 21.8 seconds — ABNORMAL HIGH (ref 11.4–15.2)

## 2020-04-30 LAB — CBC WITH DIFFERENTIAL/PLATELET
Abs Immature Granulocytes: 0.05 10*3/uL (ref 0.00–0.07)
Basophils Absolute: 0.1 10*3/uL (ref 0.0–0.1)
Basophils Relative: 1 %
Eosinophils Absolute: 0.1 10*3/uL (ref 0.0–0.5)
Eosinophils Relative: 1 %
HCT: 37.6 % (ref 36.0–46.0)
Hemoglobin: 12.1 g/dL (ref 12.0–15.0)
Immature Granulocytes: 1 %
Lymphocytes Relative: 9 %
Lymphs Abs: 0.8 10*3/uL (ref 0.7–4.0)
MCH: 30.3 pg (ref 26.0–34.0)
MCHC: 32.2 g/dL (ref 30.0–36.0)
MCV: 94.2 fL (ref 80.0–100.0)
Monocytes Absolute: 0.8 10*3/uL (ref 0.1–1.0)
Monocytes Relative: 9 %
Neutro Abs: 7.1 10*3/uL (ref 1.7–7.7)
Neutrophils Relative %: 79 %
Platelets: 204 10*3/uL (ref 150–400)
RBC: 3.99 MIL/uL (ref 3.87–5.11)
RDW: 14 % (ref 11.5–15.5)
WBC: 8.9 10*3/uL (ref 4.0–10.5)
nRBC: 0 % (ref 0.0–0.2)

## 2020-04-30 LAB — CK: Total CK: 97 U/L (ref 38–234)

## 2020-04-30 LAB — TROPONIN I (HIGH SENSITIVITY)
Troponin I (High Sensitivity): 13 ng/L (ref <18)
Troponin I (High Sensitivity): 16 ng/L (ref <18)

## 2020-04-30 LAB — COMPREHENSIVE METABOLIC PANEL
ALT: 22 U/L (ref 0–44)
AST: 25 U/L (ref 15–41)
Albumin: 3.9 g/dL (ref 3.5–5.0)
Alkaline Phosphatase: 54 U/L (ref 38–126)
Anion gap: 12 (ref 5–15)
BUN: 19 mg/dL (ref 8–23)
CO2: 22 mmol/L (ref 22–32)
Calcium: 8.9 mg/dL (ref 8.9–10.3)
Chloride: 103 mmol/L (ref 98–111)
Creatinine, Ser: 1.49 mg/dL — ABNORMAL HIGH (ref 0.44–1.00)
GFR calc Af Amer: 39 mL/min — ABNORMAL LOW (ref >60)
GFR calc non Af Amer: 34 mL/min — ABNORMAL LOW (ref >60)
Glucose, Bld: 168 mg/dL — ABNORMAL HIGH (ref 70–99)
Potassium: 3.2 mmol/L — ABNORMAL LOW (ref 3.5–5.1)
Sodium: 137 mmol/L (ref 135–145)
Total Bilirubin: 0.9 mg/dL (ref 0.3–1.2)
Total Protein: 6.9 g/dL (ref 6.5–8.1)

## 2020-04-30 LAB — CBG MONITORING, ED
Glucose-Capillary: 168 mg/dL — ABNORMAL HIGH (ref 70–99)
Glucose-Capillary: 202 mg/dL — ABNORMAL HIGH (ref 70–99)

## 2020-04-30 LAB — MAGNESIUM: Magnesium: 1.8 mg/dL (ref 1.7–2.4)

## 2020-04-30 LAB — HEMOGLOBIN A1C
Hgb A1c MFr Bld: 7.4 % — ABNORMAL HIGH (ref 4.8–5.6)
Mean Plasma Glucose: 165.68 mg/dL

## 2020-04-30 LAB — SARS CORONAVIRUS 2 BY RT PCR (HOSPITAL ORDER, PERFORMED IN ~~LOC~~ HOSPITAL LAB): SARS Coronavirus 2: NEGATIVE

## 2020-04-30 MED ORDER — ATORVASTATIN CALCIUM 40 MG PO TABS
40.0000 mg | ORAL_TABLET | Freq: Every evening | ORAL | Status: DC
  Administered 2020-04-30: 40 mg via ORAL
  Filled 2020-04-30: qty 1

## 2020-04-30 MED ORDER — GUAIFENESIN-DM 100-10 MG/5ML PO SYRP
10.0000 mL | ORAL_SOLUTION | Freq: Three times a day (TID) | ORAL | Status: DC
  Administered 2020-04-30 – 2020-05-01 (×2): 10 mL via ORAL
  Filled 2020-04-30 (×2): qty 10

## 2020-04-30 MED ORDER — POTASSIUM CHLORIDE CRYS ER 20 MEQ PO TBCR
40.0000 meq | EXTENDED_RELEASE_TABLET | Freq: Once | ORAL | Status: AC
  Administered 2020-04-30: 40 meq via ORAL
  Filled 2020-04-30: qty 2

## 2020-04-30 MED ORDER — ONDANSETRON HCL 4 MG PO TABS: 4.0000 mg | ORAL_TABLET | Freq: Four times a day (QID) | ORAL | Status: DC | PRN

## 2020-04-30 MED ORDER — ACETAMINOPHEN 325 MG PO TABS: 650.0000 mg | ORAL_TABLET | Freq: Four times a day (QID) | ORAL | Status: DC | PRN

## 2020-04-30 MED ORDER — INSULIN ASPART 100 UNIT/ML ~~LOC~~ SOLN
0.0000 [IU] | Freq: Three times a day (TID) | SUBCUTANEOUS | Status: DC
  Administered 2020-05-01 (×2): 2 [IU] via SUBCUTANEOUS
  Filled 2020-04-30 (×2): qty 1

## 2020-04-30 MED ORDER — SODIUM CHLORIDE 0.9 % IV BOLUS
500.0000 mL | Freq: Once | INTRAVENOUS | Status: AC
  Administered 2020-04-30: 500 mL via INTRAVENOUS

## 2020-04-30 MED ORDER — ESCITALOPRAM OXALATE 10 MG PO TABS
10.0000 mg | ORAL_TABLET | Freq: Every day | ORAL | Status: DC
  Administered 2020-05-01: 10 mg via ORAL
  Filled 2020-04-30: qty 1

## 2020-04-30 MED ORDER — WARFARIN SODIUM 2.5 MG PO TABS
3.7500 mg | ORAL_TABLET | Freq: Once | ORAL | Status: AC
  Administered 2020-04-30: 3.75 mg via ORAL
  Filled 2020-04-30: qty 1

## 2020-04-30 MED ORDER — FLUTICASONE PROPIONATE HFA 44 MCG/ACT IN AERO
2.0000 | INHALATION_SPRAY | Freq: Two times a day (BID) | RESPIRATORY_TRACT | Status: DC
  Filled 2020-04-30: qty 10.6

## 2020-04-30 MED ORDER — POTASSIUM CHLORIDE IN NACL 40-0.9 MEQ/L-% IV SOLN
INTRAVENOUS | Status: AC
  Administered 2020-05-01: 100 mL/h via INTRAVENOUS
  Filled 2020-04-30 (×3): qty 1000

## 2020-04-30 MED ORDER — CALCIUM CARBONATE ANTACID 500 MG PO CHEW
1.0000 | CHEWABLE_TABLET | Freq: Once | ORAL | Status: AC
  Administered 2020-04-30: 200 mg via ORAL
  Filled 2020-04-30: qty 1

## 2020-04-30 MED ORDER — METOPROLOL TARTRATE 25 MG PO TABS
25.0000 mg | ORAL_TABLET | Freq: Two times a day (BID) | ORAL | Status: DC
  Administered 2020-04-30 – 2020-05-01 (×2): 25 mg via ORAL
  Filled 2020-04-30 (×2): qty 1

## 2020-04-30 MED ORDER — ONDANSETRON HCL 4 MG/2ML IJ SOLN: 4.0000 mg | Freq: Four times a day (QID) | INTRAMUSCULAR | Status: DC | PRN

## 2020-04-30 MED ORDER — LEVOTHYROXINE SODIUM 50 MCG PO TABS
100.0000 ug | ORAL_TABLET | Freq: Every day | ORAL | Status: DC
  Administered 2020-05-01: 100 ug via ORAL
  Filled 2020-04-30: qty 2

## 2020-04-30 MED ORDER — ACETAMINOPHEN 650 MG RE SUPP: 650.0000 mg | Freq: Four times a day (QID) | RECTAL | Status: DC | PRN

## 2020-04-30 MED ORDER — FLUTICASONE PROPIONATE 50 MCG/ACT NA SUSP
1.0000 | Freq: Every day | NASAL | Status: DC | PRN
  Filled 2020-04-30: qty 16

## 2020-04-30 MED ORDER — CEFDINIR 300 MG PO CAPS
300.0000 mg | ORAL_CAPSULE | Freq: Two times a day (BID) | ORAL | Status: DC
  Administered 2020-04-30 – 2020-05-01 (×2): 300 mg via ORAL
  Filled 2020-04-30 (×2): qty 1

## 2020-04-30 MED ORDER — ALBUTEROL SULFATE HFA 108 (90 BASE) MCG/ACT IN AERS: 2.0000 | INHALATION_SPRAY | RESPIRATORY_TRACT | Status: DC | PRN

## 2020-04-30 MED ORDER — INSULIN ASPART 100 UNIT/ML ~~LOC~~ SOLN: 0.0000 [IU] | Freq: Every day | SUBCUTANEOUS | Status: DC

## 2020-04-30 MED ORDER — DILTIAZEM HCL ER COATED BEADS 180 MG PO CP24
180.0000 mg | ORAL_CAPSULE | Freq: Every day | ORAL | Status: DC
  Administered 2020-05-01: 180 mg via ORAL
  Filled 2020-04-30 (×4): qty 1

## 2020-04-30 MED ORDER — POLYETHYLENE GLYCOL 3350 17 G PO PACK: 17.0000 g | PACK | Freq: Every day | ORAL | Status: DC | PRN

## 2020-04-30 MED ORDER — WARFARIN - PHARMACIST DOSING INPATIENT: Freq: Every day | Status: DC

## 2020-04-30 NOTE — ED Provider Notes (Signed)
  Physical Exam  BP (!) 109/57   Pulse (!) 108   Temp 99.3 F (37.4 C) (Oral)   Resp 19   Ht 5\' 2"  (1.575 m)   Wt 81 kg   SpO2 93%   BMI 32.66 kg/m   Physical Exam  ED Course/Procedures     Procedures  MDM   Assuming care of patient from Dr Laverta Baltimore.   Patient in the ED for syncope, w/o prodrome, while she was outside her home in heat. Workup thus far shows ekg with non specific changes. Labs are reassuring.  Concerning findings are as following: none Important pending results are : trops and CMP.  According to Dr. Laverta Baltimore, plan is to reassess and decide on disposition.  She has hx of valvular disorder and AF. Anticipate admission.  Patient had no complains, no concerns from the nursing side. Will continue to monitor.        Varney Biles, MD 04/30/20 [phone]

## 2020-04-30 NOTE — Progress Notes (Signed)
ANTICOAGULATION CONSULT NOTE - Initial Consult  Pharmacy Consult for warfarin Indication: atrial fibrillation  Allergies  Allergen Reactions  . Amiodarone Rash    Patient Measurements: Height: 5\' 2"  (157.5 cm) Weight: 81 kg (178 lb 9.2 oz) IBW/kg (Calculated) : 50.1  Vital Signs: Temp: 99.3 F (37.4 C) (07/29 1351) Temp Source: Oral (07/29 1351) BP: 109/57 (07/29 1432) Pulse Rate: 108 (07/29 1432)  Labs: Recent Labs    04/30/20 1405 04/30/20 1628  HGB 12.1  --   HCT 37.6  --   PLT 204  --   LABPROT 21.8*  --   INR 2.0*  --   CREATININE 1.49*  --   TROPONINIHS 13 16    Estimated Creatinine Clearance: 32.2 mL/min (A) (by C-G formula based on SCr of 1.49 mg/dL (H)).   Medical History: Past Medical History:  Diagnosis Date  . Anemia   . Atrial fibrillation ([place])   . Breast carcinoma ([place]) 1995   1995  . Cardioembolic stroke ([place]) 11/9516   Right frontal in 01/2012; normal carotid ultrasound; possible LAA thrombus by TEE; virtual complete neurologic recovery  . Chronic kidney disease, stage 2, mildly decreased GFR    GFR of approximately 60  . Depression   . Diverticulosis of colon (without mention of hemorrhage) 2012   Dr. Laural Golden  . Fasting hyperglycemia    120 fasting  . Gastroesophageal reflux disease   . Gastroparesis   . Hemorrhoids   . Hyperlipidemia   . Hypertension    pt denies 05/30/13     Dr Kathe Mariner  . Hyponatremia   . Rheumatic heart disease    a. 07/2013 Echo: Ef 55-60%, Mod AS/AI, Mod-Sev MS, sev dil LA, PASP 58;  b. 07/2013 TEE: EF 35-40%, mild-mod AS/AI, mod-sev MS, LA smoke;  c. 07/2013 Cath: elev R heart pressures, nl cors.  . Shortness of breath     Assessment: 74 y.o. female with history of mitral and aortic valve replacement (bioprosthetic) and atrial fibrillation presenting with syncope. Patient takes warfarin prior to admission, home dose 3.75 mg every Sun, Tue, Thu and 2.5mg  all other days of the week; last PTA dose 7/28 at  1800. Pharmacy has been consulted to dose warfarin.  Today, INR is subtherapeutic at 2.0. Hgb and platelets within normal limits. No overt bleeding noted.  Goal of Therapy:  Goal INR: 2.5-3.5 Monitor platelets by anticoagulation protocol: Yes  Plan:  Warfarin 3.75mg  today x1 Monitor CBC, PT/INR daily Continue to monitor for signs/symptoms of bleeding Continue to monitor for drug/food interactions that affect INR   Brendolyn Patty, PharmD Clinical Pharmacist  04/30/2020   6:42 PM   Please check AMION for all [place] phone numbers After 10:00 PM, call the [facility] [phone]

## 2020-04-30 NOTE — H&P (Addendum)
History and Physical    [name] FXT:[id number] DOB: 06-30-1946 DOA: 04/30/2020  PCP: [name], MD   Patient coming from: Home  I have personally briefly reviewed patient's old medical records in [place]  Chief Complaint: Passing out  HPI: [name] is a 74 y.o. female with medical history significant for HF, mitral and aortic valve replacement, atrial fibrillation and  cardioembolic stroke on chronic anticoagulation, hypertension. Patient was brought to the ED via EMS reports patient passed out while she was gardening outside under the hot sun.  Patient was standing and working in the garden and the next thing she knows she woke up on the ground.  She reports she had been working outside under the hot sun for at least 30 minutes.  She does not know how long she was out of consciousness.  The person who mows their lawn found patient on the floor. Patient denies prior dizziness.  She denies prodrome she did not know she was going to pass out.  Patient denies difficulty breathing, no cough no chest pain.  No vomiting, no loose stools, she has maintained good oral intake.  Spouse reports patient has felt generalized weakness over the past few weeks.  She saw her primary care provider, for this, but everything has checked out. No prior episodes of losing consciousness.  ED Course: T-max 99.3, tachycardic to 208, blood pressure 10 9-1 1 3.  O2 sats > 92% on room air.  WBC 8.9.  Potassium 3.2.  Creatinine mildly elevated at 1.49.  Blood glucose 168.  Portable chest x-ray and head CT without acute abnormality.  EKG unchanged from prior.  Troponin 13.  1 L bolus given.  Hospitalist admit for syncope.  Review of Systems: As per HPI all other systems reviewed and negative.  Past Medical History:  Diagnosis Date  . Anemia   . Atrial fibrillation ([place])   . Breast carcinoma ([place]) 1995   1995  . Cardioembolic stroke ([place]) 11/9922   Right frontal in 01/2012; normal carotid  ultrasound; possible LAA thrombus by TEE; virtual complete neurologic recovery  . Chronic kidney disease, stage 2, mildly decreased GFR    GFR of approximately 60  . Depression   . Diverticulosis of colon (without mention of hemorrhage) 2012   Dr. Laural Golden  . Fasting hyperglycemia    120 fasting  . Gastroesophageal reflux disease   . Gastroparesis   . Hemorrhoids   . Hyperlipidemia   . Hypertension    pt denies 05/30/13     Dr Kathe Mariner  . Hyponatremia   . Rheumatic heart disease    a. 07/2013 Echo: Ef 55-60%, Mod AS/AI, Mod-Sev MS, sev dil LA, PASP 58;  b. 07/2013 TEE: EF 35-40%, mild-mod AS/AI, mod-sev MS, LA smoke;  c. 07/2013 Cath: elev R heart pressures, nl cors.  . Shortness of breath     Past Surgical History:  Procedure Laterality Date  . AORTIC VALVE REPLACEMENT N/A 07/18/2013   Procedure: AORTIC VALVE REPLACEMENT (AVR);  Surgeon: Gaye Pollack, MD;  Location: [facility];  Service: Open Heart Surgery;  Laterality: N/A;  . BACK SURGERY    . BREAST LUMPECTOMY Right 1995  . CARDIAC CATHETERIZATION    . CARDIOVERSION N/A 07/26/2013   Procedure: CARDIOVERSION;  Surgeon: Fay Records, MD;  Location: [facility] ENDOSCOPY;  Service: Cardiovascular;  Laterality: N/A;  . COLONOSCOPY  2012   Negative screening procedure  . DILATION AND CURETTAGE OF UTERUS    .  ESOPHAGEAL MANOMETRY N/A 06/17/2013   Procedure: ESOPHAGEAL MANOMETRY (EM);  Surgeon: Sable Feil, MD;  Location: [facility] ENDOSCOPY;  Service: Endoscopy;  Laterality: N/A;  . INTRAOPERATIVE TRANSESOPHAGEAL ECHOCARDIOGRAM N/A 07/18/2013   Procedure: INTRAOPERATIVE TRANSESOPHAGEAL ECHOCARDIOGRAM;  Surgeon: Gaye Pollack, MD;  Location: [place] OR;  Service: Open Heart Surgery;  Laterality: N/A;  . LEFT AND RIGHT HEART CATHETERIZATION WITH CORONARY ANGIOGRAM N/A 07/10/2013   Procedure: LEFT AND RIGHT HEART CATHETERIZATION WITH CORONARY ANGIOGRAM;  Surgeon: Burnell Blanks, MD;  Location: [facility] CATH LAB;  Service: Cardiovascular;   Laterality: N/A;  . LUMBAR LAMINECTOMY/DECOMPRESSION MICRODISCECTOMY Right 05/13/2014   Procedure: LUMBAR LAMINECTOMY/DECOMPRESSION MICRODISCECTOMY 1 LEVEL  lumbar four/five;  Surgeon: Faythe Ghee, MD;  Location: [facility] NEURO ORS;  Service: Neurosurgery;  Laterality: Right;  . MITRAL VALVE REPLACEMENT N/A 07/18/2013   Procedure: MITRAL VALVE (MV) REPLACEMENT;  Surgeon: Gaye Pollack, MD;  Location: [place] OR;  Service: Open Heart Surgery;  Laterality: N/A;  . MITRAL VALVE [place], closed mitral valvulotomy by finger fracture  . TEE WITHOUT CARDIOVERSION  01/24/2012   Procedure: TRANSESOPHAGEAL ECHOCARDIOGRAM (TEE);  Surgeon: Lelon Perla, MD;  Location: [facility] ENDOSCOPY;  Service: Cardiovascular;  Laterality: N/A;  . TEE WITHOUT CARDIOVERSION N/A 07/11/2013   Procedure: TRANSESOPHAGEAL ECHOCARDIOGRAM (TEE);  Surgeon: Thayer Headings, MD;  Location: [place];  Service: Cardiovascular;  Laterality: N/A;  . TEE WITHOUT CARDIOVERSION N/A 07/26/2013   Procedure: TRANSESOPHAGEAL ECHOCARDIOGRAM (TEE);  Surgeon: Fay Records, MD;  Location: [facility] ENDOSCOPY;  Service: Cardiovascular;  Laterality: N/A;  . [place]     reports that she has never smoked. She has never used smokeless tobacco. She reports that she does not drink alcohol and does not use drugs.  Allergies  Allergen Reactions  . Amiodarone Rash    Family History  Problem Relation Age of Onset  . Heart disease Brother 8       MI  . Rheum arthritis Maternal Grandmother   . Asthma Maternal Grandfather   . Hypothyroidism Mother   . Diabetes Sister   . Cirrhosis Father     Prior to Admission medications   Medication Sig Start Date End Date Taking? Authorizing Provider  albuterol (VENTOLIN HFA) 108 (90 BASE) MCG/ACT inhaler Inhale 2 puffs into the lungs every 4 (four) hours as needed for wheezing or shortness of breath.     [provider]  albuterol (VENTOLIN HFA) 108 (90 Base) MCG/ACT inhaler Inhale  1-2 puffs into the lungs every 6 (six) hours as needed for wheezing or shortness of breath. 04/01/20   Rancour, Annie Main, MD  atorvastatin (LIPITOR) 40 MG tablet TAKE ONE TABLET BY MOUTH ONCE DAILY. 04/29/19   Arnoldo Lenis, MD  benzonatate (TESSALON) 100 MG capsule Take 1 capsule (100 mg total) by mouth every 8 (eight) hours. 04/01/20   Rancour, Annie Main, MD  diltiazem (CARDIZEM CD) 180 MG 24 hr capsule Take 1 capsule (180 mg total) by mouth daily. 08/03/13   Collins, Arlyn Leak, PA-C  doxycycline (VIBRAMYCIN) 100 MG capsule Take 1 capsule (100 mg total) by mouth 2 (two) times daily. 04/01/20   Rancour, Annie Main, MD  escitalopram (LEXAPRO) 10 MG tablet Take 10 mg by mouth daily.  04/10/15   [provider]  fexofenadine (ALLEGRA) 30 MG tablet Take 30 mg by mouth daily.     [provider]  FLOVENT HFA 44 MCG/ACT inhaler Inhale 2 puffs into the lungs 2 (two) times daily.  04/24/18  [provider]  fluticasone (FLONASE) 50 MCG/ACT nasal spray Place 1 spray into both nostrils daily as needed.  09/05/13   Arnoldo Lenis, MD  furosemide (LASIX) 40 MG tablet TAKE ONE TABLET BY MOUTH ONCE DAILY.MAY TAKE AN ADDITIONAL TABLET AT BEDTIME AS NEEDED FOR SWELLING. 12/19/19   Arnoldo Lenis, MD  levothyroxine (SYNTHROID, LEVOTHROID) 112 MCG tablet Take 1 tablet by mouth daily. Pt taking 100 mcg. 10/17/13   [provider]  metFORMIN (GLUCOPHAGE) 500 MG tablet Take 1 tablet (500 mg total) by mouth 2 (two) times daily with a meal. 08/03/13   Collins, Gina L, PA-C  metoprolol tartrate (LOPRESSOR) 25 MG tablet TAKE (1) TABLET BY MOUTH TWICE DAILY. 04/29/20   Arnoldo Lenis, MD  Multiple Vitamin (MULITIVITAMIN WITH MINERALS) TABS Take 1 tablet by mouth daily.    [provider]  olmesartan (BENICAR) 20 MG tablet Take 0.5 tablets (10 mg total) by mouth daily. 05/29/18   Arnoldo Lenis, MD  potassium chloride SA (K-DUR,KLOR-CON) 20 MEQ tablet Take 20 mEq by mouth daily.  08/14/13   Gaye Pollack, MD  predniSONE (DELTASONE) 50 MG tablet One tablet PO daily 04/01/20   Rancour, Annie Main, MD  warfarin (COUMADIN) 2.5 MG tablet TAKE AS DIRECTED BY COUMADIN CLINIC. 01/01/20   Arnoldo Lenis, MD    Physical Exam: Vitals:   04/30/20 1351 04/30/20 1402 04/30/20 1432  BP: (!) 113/50  (!) 109/57  Pulse: (!) 108  (!) 108  Resp: 21  19  Temp: 99.3 F (37.4 C)    TempSrc: Oral    SpO2: 92%  93%  Weight:  81 kg   Height:  5\' 2"  (1.575 m)     Constitutional: NAD, calm, comfortable Vitals:   04/30/20 1351 04/30/20 1402 04/30/20 1432  BP: (!) 113/50  (!) 109/57  Pulse: (!) 108  (!) 108  Resp: 21  19  Temp: 99.3 F (37.4 C)    TempSrc: Oral    SpO2: 92%  93%  Weight:  81 kg   Height:  5\' 2"  (1.575 m)    Eyes: PERRL, lids and conjunctivae normal ENMT: Mucous membranes are moist. Posterior pharynx clear of any exudate or lesions.Normal dentition.  Neck: normal, supple, no masses, no thyromegaly Respiratory: Coughing, has a chronic productive cough, spouse thinks this has worsened recently.  Clear to auscultation bilaterally, no wheezing, no crackles. Normal respiratory effort. No accessory muscle use.  Cardiovascular: Regular rate and rhythm, no murmurs / rubs / gallops. No extremity edema. 2+ pedal pulses.  Abdomen: no tenderness, no masses palpated. No hepatosplenomegaly. Bowel sounds positive.  Musculoskeletal: no clubbing / cyanosis. No joint deformity upper and lower extremities. Good ROM, no contractures. Normal muscle tone.  Skin: no rashes, lesions, ulcers. No induration Neurologic: No apparent cranial abnormality, moving extremities spontaneously. Psychiatric: Normal judgment and insight. Alert and oriented x 3. Normal mood.   Labs on Admission: I have personally reviewed following labs and imaging studies  CBC: Recent Labs  Lab 04/30/20 1405  WBC 8.9  NEUTROABS 7.1  HGB 12.1  HCT 37.6  MCV 94.2  PLT 093   Basic Metabolic Panel: Recent  Labs  Lab 04/30/20 1405  NA 137  K 3.2*  CL 103  CO2 22  GLUCOSE 168*  BUN 19  CREATININE 1.49*  CALCIUM 8.9   Liver Function Tests: Recent Labs  Lab 04/30/20 1405  AST 25  ALT 22  ALKPHOS 54  BILITOT 0.9  PROT 6.9  ALBUMIN  3.9   CBG: Recent Labs  Lab 04/30/20 1359  GLUCAP 168*    Radiological Exams on Admission: CT Head Wo Contrast  Result Date: 04/30/2020 CLINICAL DATA:  Head trauma, mod-severe Found passed out in backyard. EXAM: CT HEAD WITHOUT CONTRAST TECHNIQUE: Contiguous axial images were obtained from the base of the skull through the vertex without intravenous contrast. COMPARISON:  Head CT 11/12/2016 FINDINGS: Brain: No evidence of acute infarction, hemorrhage, hydrocephalus, extra-axial collection or mass lesion/mass effect. Generalized atrophy and chronic small vessel ischemia are unchanged. Remote left cerebellar and right frontal infarcts are again seen. Vascular: Atherosclerosis of skullbase vasculature without hyperdense vessel or abnormal calcification. Skull: No fracture or focal lesion. Sinuses/Orbits: Paranasal sinuses and mastoid air cells are clear. The visualized orbits are unremarkable. Other: None. IMPRESSION: 1. No acute intracranial abnormality. No skull fracture. 2. Unchanged atrophy, chronic small vessel ischemia, and remote right frontal and left cerebellar infarcts. Electronically Signed   By: Keith Rake M.D.   On: 04/30/2020 15:36   DG Chest Portable 1 View  Result Date: 04/30/2020 CLINICAL DATA:  Syncope. EXAM: PORTABLE CHEST 1 VIEW COMPARISON:  April 01, 2020 FINDINGS: Multiple sternal wires are seen. Mild, chronic appearing increased lung markings are present without evidence of acute infiltrate, pleural effusion or pneumothorax. The cardiac silhouette is borderline in size. Two artificial cardiac valves are again seen. Radiopaque surgical clips are seen along the lateral aspect of the right hemithorax. The visualized skeletal structures  are unremarkable. IMPRESSION: 1. Evidence of prior median sternotomy/CABG. 2. Stable artificial cardiac valves. Electronically Signed   By: Virgina Norfolk M.D.   On: 04/30/2020 15:02    EKG: Independently reviewed.  Sinus rhythm, rate 108, QTc 484.  No significant ST or T wave changes compared to prior EKG.  Assessment/Plan Principal Problem:   Syncope Active Problems:   Hypertension   Hypothyroidism   Chronic anticoagulation   Cardioembolic stroke [facility])   Atrial fibrillation (HCC)   Syncope- mild tachycardia, temperature elevated at 99.3, acute kidney injury, mild electrolyte abnormalities, suggest heat exhaustion/dehydration as likely etiology.  Head CT and portable chest x-ray without acute abnormality.  EKG troponin x 1 unremarkable. -Obtain updated echocardiogram -1 L bolus given, continue N/s + 40 KCL 100cc/hr x 15hrs. -Check magnesium, TSH, CK  Mild acute kidney injury-creatinine 1.49, baseline 0.9-1.1.  Likely prerenal, in the setting of Lasix use, olmesartan -Hold home Lasix, olmesartan -IV fluids - BMP a.m  Mild hypokalemia-3.2. -Replete, check magnesium.  History of mitral and aortic valve replacement, Atrial fibrillation-currently in sinus rhythm, mildly tachycardic, on chronic anticoagulation with warfarin. -Resume warfarin, pharmacy to dose -Resume diltiazem, metoprolol  History of diastolic congestive heart failure-stable and compensated.  Mildly dehydrated.  Last echocardiogram 2019, EF 60 to 65%. -Hold home Lasix -Updated echocardiogram.  Diabetes mellitus- Random glucose 168. - hgba1c - SSI- S -Hold home Metformin  Asthma-stable.  No wheezing.  But has a chronic productive cough.- recently worse. Port chest xray without acute abnormality.  Completed Covid vaccination, Covid test negative.  Started on 7-day course of cefdinir 7/26 for cough by PCP. - Resume cefdinir -Mucolytic scheduled -Resume as needed albuterol inhaler, fluticasone inhaler  DVT  prophylaxis: Warfarin Code Status: Full code Family Communication: Spouse at bedside Disposition Plan: 1 to 2 days.  Pending observation and work-up for syncope.   Consults called: None. Admission status: Obs, tele   Bethena Roys MD [facility]  04/30/2020, 6:13 PM

## 2020-04-30 NOTE — ED Triage Notes (Signed)
Pt found out lying in the back yard after working out in the garden today. Pt is alert and oriented at this time.  VS wnl per ems.

## 2020-04-30 NOTE — ED Provider Notes (Signed)
Emergency Department Provider Note   I have reviewed the triage vital signs and the nursing notes.   HISTORY  Chief Complaint Heat Exposure   HPI [name] is a 74 y.o. female with past medical history reviewed below presents to the emergency department for evaluation of syncope while working out in the yard. Patient states that she was outside for approximately 30 minutes and was very hot outside. She did eat breakfast. She tells me that she was standing and working in the garden and the next and she knows she woke up on the ground. She denies known head injury. The next thing she remembers she was laying in the yard and she thinks her husband was running toward her. Ultimately EMS was called and transported her after starting an IV along with fluids. Patient states that up until this point she been feeling well. No changes to medications. No fevers or chills. She has been vaccinated for COVID-19. She is not currently having chest discomfort, shortness of breath, headache, weakness/numbness.  Past Medical History:  Diagnosis Date  . Anemia   . Atrial fibrillation ([place])   . Breast carcinoma ([place]) 1995   1995  . Cardioembolic stroke ([place]) 11/4266   Right frontal in 01/2012; normal carotid ultrasound; possible LAA thrombus by TEE; virtual complete neurologic recovery  . Chronic kidney disease, stage 2, mildly decreased GFR    GFR of approximately 60  . Depression   . Diverticulosis of colon (without mention of hemorrhage) 2012   Dr. Laural Golden  . Fasting hyperglycemia    120 fasting  . Gastroesophageal reflux disease   . Gastroparesis   . Hemorrhoids   . Hyperlipidemia   . Hypertension    pt denies 05/30/13     Dr Kathe Mariner  . Hyponatremia   . Rheumatic heart disease    a. 07/2013 Echo: Ef 55-60%, Mod AS/AI, Mod-Sev MS, sev dil LA, PASP 58;  b. 07/2013 TEE: EF 35-40%, mild-mod AS/AI, mod-sev MS, LA smoke;  c. 07/2013 Cath: elev R heart pressures, nl cors.  . Shortness  of breath     Patient Active Problem List   Diagnosis Date Noted  . Syncope 04/30/2020  . Asthma exacerbation 11/12/2016  . Acute CHF (congestive heart failure) ([place]) 11/12/2016  . Synovial cyst of lumbar spine 05/13/2014  . Encounter for therapeutic drug monitoring 11/06/2013  . Heart valve replaced by other means 08/05/2013  . Hyponatremia   . Atrial fibrillation ([place])   . Moderate aortic stenosis 07/12/2013  . Moderate aortic insufficiency 07/12/2013  . Severe mitral valve stenosis 07/09/2013  . Mitral stenosis 07/08/2013  . Acute respiratory failure with hypoxia ([place]) 07/07/2013  . Acute pulmonary edema ([place]) 07/07/2013  . Pleural effusion, bilateral 07/07/2013  . Chest pain 07/07/2013  . Asthma, cough variant 03/27/2013  . Chronic cough 09/17/2012  . Dyspnea on exertion 06/13/2012  . Edema 06/13/2012  . Rheumatic heart disease 06/13/2012  . Fasting hyperglycemia   . Chronic anticoagulation 01/26/2012  . Hypothyroidism 01/23/2012  . Cardioembolic stroke ([place]) 34/19/6222  . Hyperlipidemia   . Hypertension   . Breast carcinoma ([place])   . Gastroesophageal reflux disease     Past Surgical History:  Procedure Laterality Date  . AORTIC VALVE REPLACEMENT N/A 07/18/2013   Procedure: AORTIC VALVE REPLACEMENT (AVR);  Surgeon: Gaye Pollack, MD;  Location: [facility];  Service: Open Heart Surgery;  Laterality: N/A;  . BACK SURGERY    . BREAST LUMPECTOMY Right 1995  .  CARDIAC CATHETERIZATION    . CARDIOVERSION N/A 07/26/2013   Procedure: CARDIOVERSION;  Surgeon: Fay Records, MD;  Location: [facility] ENDOSCOPY;  Service: Cardiovascular;  Laterality: N/A;  . COLONOSCOPY  2012   Negative screening procedure  . DILATION AND CURETTAGE OF UTERUS    . ESOPHAGEAL MANOMETRY N/A 06/17/2013   Procedure: ESOPHAGEAL MANOMETRY (EM);  Surgeon: Sable Feil, MD;  Location: [facility] ENDOSCOPY;  Service: Endoscopy;  Laterality: N/A;  . INTRAOPERATIVE TRANSESOPHAGEAL ECHOCARDIOGRAM N/A 07/18/2013    Procedure: INTRAOPERATIVE TRANSESOPHAGEAL ECHOCARDIOGRAM;  Surgeon: Gaye Pollack, MD;  Location: [facility] OR;  Service: Open Heart Surgery;  Laterality: N/A;  . LEFT AND RIGHT HEART CATHETERIZATION WITH CORONARY ANGIOGRAM N/A 07/10/2013   Procedure: LEFT AND RIGHT HEART CATHETERIZATION WITH CORONARY ANGIOGRAM;  Surgeon: Burnell Blanks, MD;  Location: [facility] CATH LAB;  Service: Cardiovascular;  Laterality: N/A;  . LUMBAR LAMINECTOMY/DECOMPRESSION MICRODISCECTOMY Right 05/13/2014   Procedure: LUMBAR LAMINECTOMY/DECOMPRESSION MICRODISCECTOMY 1 LEVEL  lumbar four/five;  Surgeon: Faythe Ghee, MD;  Location: [facility] NEURO ORS;  Service: Neurosurgery;  Laterality: Right;  . MITRAL VALVE REPLACEMENT N/A 07/18/2013   Procedure: MITRAL VALVE (MV) REPLACEMENT;  Surgeon: Gaye Pollack, MD;  Location: [facility] OR;  Service: Open Heart Surgery;  Laterality: N/A;  . MITRAL VALVE [place], closed mitral valvulotomy by finger fracture  . TEE WITHOUT CARDIOVERSION  01/24/2012   Procedure: TRANSESOPHAGEAL ECHOCARDIOGRAM (TEE);  Surgeon: Lelon Perla, MD;  Location: [facility] ENDOSCOPY;  Service: Cardiovascular;  Laterality: N/A;  . TEE WITHOUT CARDIOVERSION N/A 07/11/2013   Procedure: TRANSESOPHAGEAL ECHOCARDIOGRAM (TEE);  Surgeon: Thayer Headings, MD;  Location: [facility];  Service: Cardiovascular;  Laterality: N/A;  . TEE WITHOUT CARDIOVERSION N/A 07/26/2013   Procedure: TRANSESOPHAGEAL ECHOCARDIOGRAM (TEE);  Surgeon: Fay Records, MD;  Location: [facility] ENDOSCOPY;  Service: Cardiovascular;  Laterality: N/A;  . TUBAL LIGATION  1973    Allergies Amiodarone  Family History  Problem Relation Age of Onset  . Heart disease Brother 84       MI  . Rheum arthritis Maternal Grandmother   . Asthma Maternal Grandfather   . Hypothyroidism Mother   . Diabetes Sister   . Cirrhosis Father     Social History Social History   Tobacco Use  . Smoking status: Never Smoker  . Smokeless tobacco: Never Used  Vaping  Use  . Vaping Use: Never used  Substance Use Topics  . Alcohol use: No    Alcohol/week: 0.0 standard drinks  . Drug use: No    Review of Systems   Constitutional: No fever/chills Eyes: No visual changes. ENT: No sore throat. Cardiovascular: Denies chest pain. Positive syncope.  Respiratory: Denies shortness of breath. Gastrointestinal: No abdominal pain.  No nausea, no vomiting.  No diarrhea.  No constipation. Genitourinary: Negative for dysuria. Musculoskeletal: Negative for back pain. Skin: Negative for rash. Neurological: Negative for headaches, focal weakness or numbness.  10-point ROS otherwise negative.  ____________________________________________   PHYSICAL EXAM:  VITAL SIGNS: ED Triage Vitals  Enc Vitals Group     BP 04/30/20 1351 (!) 113/50     Pulse Rate 04/30/20 1351 (!) 108     Resp 04/30/20 1351 21     Temp 04/30/20 1351 99.3 F (37.4 C)     Temp Source 04/30/20 1351 Oral     SpO2 04/30/20 1351 92 %     Weight 04/30/20 1402 178 lb 9.2 oz (81 kg)     Height 04/30/20 1402 5\' 2"  (1.575  m)   Constitutional: Alert and oriented. Well appearing and in no acute distress. Eyes: Conjunctivae are normal. PERRL. Head: Atraumatic. Nose: No congestion/rhinnorhea. Mouth/Throat: Mucous membranes are moist.  Neck: No stridor.  Cardiovascular: Tachycardia. Good peripheral circulation. Grossly normal heart sounds.   Respiratory: Normal respiratory effort.  No retractions. Lungs CTAB. Gastrointestinal: Soft and nontender. No distention.  Musculoskeletal: No gross deformities of extremities. Neurologic:  Normal speech and language. Equal strength in the bilateral upper and lower extremities. No facial asymmetry. Skin:  Skin is warm, dry and intact. No rash noted.  ____________________________________________   LABS (all labs ordered are listed, but only abnormal results are displayed)  Labs Reviewed  COMPREHENSIVE METABOLIC PANEL - Abnormal; Notable for the  following components:      Result Value   Potassium 3.2 (*)    Glucose, Bld 168 (*)    Creatinine, Ser 1.49 (*)    GFR calc non Af Amer 34 (*)    GFR calc Af Amer 39 (*)    All other components within normal limits  PROTIME-INR - Abnormal; Notable for the following components:   Prothrombin Time 21.8 (*)    INR 2.0 (*)    All other components within normal limits  HEMOGLOBIN A1C - Abnormal; Notable for the following components:   Hgb A1c MFr Bld 7.4 (*)    All other components within normal limits  PROTIME-INR - Abnormal; Notable for the following components:   Prothrombin Time 20.4 (*)    INR 1.8 (*)    All other components within normal limits  BASIC METABOLIC PANEL - Abnormal; Notable for the following components:   CO2 21 (*)    Glucose, Bld 141 (*)    Creatinine, Ser 1.20 (*)    Calcium 8.3 (*)    GFR calc non Af Amer 44 (*)    GFR calc Af Amer 51 (*)    All other components within normal limits  CBG MONITORING, ED - Abnormal; Notable for the following components:   Glucose-Capillary 168 (*)    All other components within normal limits  CBG MONITORING, ED - Abnormal; Notable for the following components:   Glucose-Capillary 202 (*)    All other components within normal limits  SARS CORONAVIRUS 2 BY RT PCR (HOSPITAL ORDER, [facility])  CBC WITH DIFFERENTIAL/PLATELET  CK  MAGNESIUM  TSH  TROPONIN I (HIGH SENSITIVITY)  TROPONIN I (HIGH SENSITIVITY)   ____________________________________________  EKG   EKG Interpretation  Date/Time:  Thursday April 30 2020 13:50:13 EDT Ventricular Rate:  108 PR Interval:    QRS Duration: 117 QT Interval:  361 QTC Calculation: 484 R Axis:   105 Text Interpretation: Sinus tachycardia Borderline prolonged PR interval Incomplete right bundle branch block Low voltage, precordial leads Nonspecific T abnormalities, lateral leads T wave changes similar to prior. No STEMI Confirmed by Nanda Quinton [phone]) on  04/30/2020 2:55:08 PM       ____________________________________________  RADIOLOGY  CT Head Wo Contrast  Result Date: 04/30/2020 CLINICAL DATA:  Head trauma, mod-severe Found passed out in backyard. EXAM: CT HEAD WITHOUT CONTRAST TECHNIQUE: Contiguous axial images were obtained from the base of the skull through the vertex without intravenous contrast. COMPARISON:  Head CT 11/12/2016 FINDINGS: Brain: No evidence of acute infarction, hemorrhage, hydrocephalus, extra-axial collection or mass lesion/mass effect. Generalized atrophy and chronic small vessel ischemia are unchanged. Remote left cerebellar and right frontal infarcts are again seen. Vascular: Atherosclerosis of skullbase vasculature without hyperdense vessel or abnormal  calcification. Skull: No fracture or focal lesion. Sinuses/Orbits: Paranasal sinuses and mastoid air cells are clear. The visualized orbits are unremarkable. Other: None. IMPRESSION: 1. No acute intracranial abnormality. No skull fracture. 2. Unchanged atrophy, chronic small vessel ischemia, and remote right frontal and left cerebellar infarcts. Electronically Signed   By: Keith Rake M.D.   On: 04/30/2020 15:36   DG Chest Portable 1 View  Result Date: 04/30/2020 CLINICAL DATA:  Syncope. EXAM: PORTABLE CHEST 1 VIEW COMPARISON:  April 01, 2020 FINDINGS: Multiple sternal wires are seen. Mild, chronic appearing increased lung markings are present without evidence of acute infiltrate, pleural effusion or pneumothorax. The cardiac silhouette is borderline in size. Two artificial cardiac valves are again seen. Radiopaque surgical clips are seen along the lateral aspect of the right hemithorax. The visualized skeletal structures are unremarkable. IMPRESSION: 1. Evidence of prior median sternotomy/CABG. 2. Stable artificial cardiac valves. Electronically Signed   By: Virgina Norfolk M.D.   On: 04/30/2020 15:02     ____________________________________________   PROCEDURES  Procedure(s) performed:   Procedures  None  ____________________________________________   INITIAL IMPRESSION / ASSESSMENT AND PLAN / ED COURSE  Pertinent labs & imaging results that were available during my care of the patient were reviewed by me and considered in my medical decision making (see chart for details).   Patient presents to the emergency department after experiencing syncope while working in the yard. He was very hot outside today in the mid 90's F. She has mild tachycardia but is awake and alert. No neuro deficits and her temp on arrival is 99.77F. Plan for gentle IV fluid bolus along with screening labs. EKG shows some T wave changes but these were seen in prior EKGs. No active chest pain or atypical ACS symptoms. Plan for CT imaging of the head along with CXR.   Labs and imaging reviewed. No acute findings. CT head and delta troponin are pending. Care transferred to Dr. Kathrynn Humble.  ____________________________________________  FINAL CLINICAL IMPRESSION(S) / ED DIAGNOSES  Final diagnoses:  Syncope and collapse     MEDICATIONS GIVEN DURING THIS VISIT:  Medications  guaiFENesin-dextromethorphan (ROBITUSSIN DM) 100-10 MG/5ML syrup 10 mL (10 mLs Oral Given 04/30/20 2156)  albuterol (VENTOLIN HFA) 108 (90 Base) MCG/ACT inhaler 2 puff (has no administration in time range)  atorvastatin (LIPITOR) tablet 40 mg (40 mg Oral Given 04/30/20 2156)  cefdinir (OMNICEF) capsule 300 mg (300 mg Oral Given 04/30/20 2156)  diltiazem (CARDIZEM CD) 24 hr capsule 180 mg (has no administration in time range)  escitalopram (LEXAPRO) tablet 10 mg (has no administration in time range)  fluticasone (FLOVENT HFA) 44 MCG/ACT inhaler 2 puff (has no administration in time range)  fluticasone (FLONASE) 50 MCG/ACT nasal spray 1 spray (has no administration in time range)  levothyroxine (SYNTHROID) tablet 100 mcg (has no administration  in time range)  metoprolol tartrate (LOPRESSOR) tablet 25 mg (25 mg Oral Given 04/30/20 2157)  acetaminophen (TYLENOL) tablet 650 mg (has no administration in time range)    Or  acetaminophen (TYLENOL) suppository 650 mg (has no administration in time range)  ondansetron (ZOFRAN) tablet 4 mg (has no administration in time range)    Or  ondansetron (ZOFRAN) injection 4 mg (has no administration in time range)  polyethylene glycol (MIRALAX / GLYCOLAX) packet 17 g (has no administration in time range)  insulin aspart (novoLOG) injection 0-9 Units (has no administration in time range)  insulin aspart (novoLOG) injection 0-5 Units (0 Units Subcutaneous Not Given 04/30/20 2157)  0.9 % NaCl with KCl 40 mEq / L  infusion ( Intravenous Rate/Dose Verify 05/01/20 0636)  Warfarin - Pharmacist Dosing Inpatient (has no administration in time range)  sodium chloride 0.9 % bolus 500 mL (0 mLs Intravenous Stopped 04/30/20 1524)  sodium chloride 0.9 % bolus 500 mL (0 mLs Intravenous Stopped 04/30/20 1648)  potassium chloride SA (KLOR-CON) CR tablet 40 mEq (40 mEq Oral Given 04/30/20 1900)  warfarin (COUMADIN) tablet 3.75 mg (3.75 mg Oral Given 04/30/20 2155)  calcium carbonate (TUMS - dosed in mg elemental calcium) chewable tablet 200 mg of elemental calcium (200 mg of elemental calcium Oral Given 04/30/20 2156)    Note:  This document was prepared using Dragon voice recognition software and may include unintentional dictation errors.  Nanda Quinton, MD, [facility]     Tonie Vizcarrondo, [facility], MD 05/01/20 [phone]

## 2020-05-01 ENCOUNTER — Observation Stay (HOSPITAL_BASED_OUTPATIENT_CLINIC_OR_DEPARTMENT_OTHER): Payer: Medicare PPO

## 2020-05-01 DIAGNOSIS — I4891 Unspecified atrial fibrillation: Secondary | ICD-10-CM

## 2020-05-01 DIAGNOSIS — I10 Essential (primary) hypertension: Secondary | ICD-10-CM | POA: Diagnosis not present

## 2020-05-01 DIAGNOSIS — R55 Syncope and collapse: Secondary | ICD-10-CM | POA: Diagnosis not present

## 2020-05-01 DIAGNOSIS — T671XXA Heat syncope, initial encounter: Secondary | ICD-10-CM

## 2020-05-01 LAB — BASIC METABOLIC PANEL
Anion gap: 8 (ref 5–15)
BUN: 15 mg/dL (ref 8–23)
CO2: 21 mmol/L — ABNORMAL LOW (ref 22–32)
Calcium: 8.3 mg/dL — ABNORMAL LOW (ref 8.9–10.3)
Chloride: 109 mmol/L (ref 98–111)
Creatinine, Ser: 1.2 mg/dL — ABNORMAL HIGH (ref 0.44–1.00)
GFR calc Af Amer: 51 mL/min — ABNORMAL LOW (ref >60)
GFR calc non Af Amer: 44 mL/min — ABNORMAL LOW (ref >60)
Glucose, Bld: 141 mg/dL — ABNORMAL HIGH (ref 70–99)
Potassium: 4.7 mmol/L (ref 3.5–5.1)
Sodium: 138 mmol/L (ref 135–145)

## 2020-05-01 LAB — CBG MONITORING, ED
Glucose-Capillary: 158 mg/dL — ABNORMAL HIGH (ref 70–99)
Glucose-Capillary: 174 mg/dL — ABNORMAL HIGH (ref 70–99)

## 2020-05-01 LAB — TSH: TSH: 0.977 u[IU]/mL (ref 0.350–4.500)

## 2020-05-01 LAB — PROTIME-INR
INR: 1.8 — ABNORMAL HIGH (ref 0.8–1.2)
Prothrombin Time: 20.4 seconds — ABNORMAL HIGH (ref 11.4–15.2)

## 2020-05-01 LAB — ECHOCARDIOGRAM COMPLETE
AR max vel: 2.22 cm2
AV Area VTI: 2.42 cm2
AV Area mean vel: 2.03 cm2
AV Mean grad: 7 mmHg
AV Peak grad: 14.4 mmHg
Ao pk vel: 1.9 m/s
Area-P 1/2: 2.5 cm2
Height: 62 in
S' Lateral: 2.38 cm
Weight: 2857.16 oz

## 2020-05-01 MED ORDER — WARFARIN SODIUM 5 MG PO TABS: 5.0000 mg | ORAL_TABLET | Freq: Once | ORAL | Status: DC

## 2020-05-01 MED ORDER — BUDESONIDE 0.25 MG/2ML IN SUSP
0.2500 mg | Freq: Two times a day (BID) | RESPIRATORY_TRACT | Status: DC
  Administered 2020-05-01: 0.25 mg via RESPIRATORY_TRACT
  Filled 2020-05-01: qty 2

## 2020-05-01 NOTE — Discharge Summary (Signed)
[name], is a 74 y.o. female  DOB 21-May-1946  MRN [id number].  Admission date:  04/30/2020  Admitting Physician  Bethena Roys, MD  Discharge Date:  05/01/2020   Primary MD  [name], MD  Recommendations for primary care physician for things to follow:   1)You are taking Coumadin/warfarin for blood thinner so please Avoid ibuprofen/Advil/Aleve/Motrin/Goody Powders/Naproxen/BC powders/Meloxicam/Diclofenac/Indomethacin and other Nonsteroidal anti-inflammatory medications as these will make you more likely to bleed and can cause stomach ulcers, can also cause Kidney problems.   2)Please follow-up with PCP for repeat CBC, BMP and PT/INR on Monday, 05/04/2020  3)The results of your test/work-up including your Heart echocardiogram has been reassuring  4)Please avoid excessive heat exposure and dehydration  Admission Diagnosis  Syncope [R55]   Discharge Diagnosis  Syncope [R55]    Principal Problem:   Syncope Active Problems:   Hypertension   Hypothyroidism   Chronic anticoagulation   Cardioembolic stroke [facility])   Atrial fibrillation [facility])      Past Medical History:  Diagnosis Date  . Anemia   . Atrial fibrillation ([place])   . Breast carcinoma ([place]) 1995   1995  . Cardioembolic stroke ([place]) 0/3704   Right frontal in 01/2012; normal carotid ultrasound; possible LAA thrombus by TEE; virtual complete neurologic recovery  . Chronic kidney disease, stage 2, mildly decreased GFR    GFR of approximately 60  . Depression   . Diverticulosis of colon (without mention of hemorrhage) 2012   Dr. Laural Golden  . Fasting hyperglycemia    120 fasting  . Gastroesophageal reflux disease   . Gastroparesis   . Hemorrhoids   . Hyperlipidemia   . Hypertension    pt denies 05/30/13     Dr Kathe Mariner  . Hyponatremia   . Rheumatic heart disease    a. 07/2013 Echo: Ef 55-60%, Mod AS/AI, Mod-Sev MS,  sev dil LA, PASP 58;  b. 07/2013 TEE: EF 35-40%, mild-mod AS/AI, mod-sev MS, LA smoke;  c. 07/2013 Cath: elev R heart pressures, nl cors.  . Shortness of breath     Past Surgical History:  Procedure Laterality Date  . AORTIC VALVE REPLACEMENT N/A 07/18/2013   Procedure: AORTIC VALVE REPLACEMENT (AVR);  Surgeon: Gaye Pollack, MD;  Location: [place];  Service: Open Heart Surgery;  Laterality: N/A;  . BACK SURGERY    . BREAST LUMPECTOMY Right 1995  . CARDIAC CATHETERIZATION    . CARDIOVERSION N/A 07/26/2013   Procedure: CARDIOVERSION;  Surgeon: Fay Records, MD;  Location: [facility] ENDOSCOPY;  Service: Cardiovascular;  Laterality: N/A;  . COLONOSCOPY  2012   Negative screening procedure  . DILATION AND CURETTAGE OF UTERUS    . ESOPHAGEAL MANOMETRY N/A 06/17/2013   Procedure: ESOPHAGEAL MANOMETRY (EM);  Surgeon: Sable Feil, MD;  Location: [facility] ENDOSCOPY;  Service: Endoscopy;  Laterality: N/A;  . INTRAOPERATIVE TRANSESOPHAGEAL ECHOCARDIOGRAM N/A 07/18/2013   Procedure: INTRAOPERATIVE TRANSESOPHAGEAL ECHOCARDIOGRAM;  Surgeon: Gaye Pollack, MD;  Location: [place] [facility];  Service: Open Heart Surgery;  Laterality:  N/A;  . LEFT AND RIGHT HEART CATHETERIZATION WITH CORONARY ANGIOGRAM N/A 07/10/2013   Procedure: LEFT AND RIGHT HEART CATHETERIZATION WITH CORONARY ANGIOGRAM;  Surgeon: Burnell Blanks, MD;  Location: [facility] C[facility] LAB;  Service: Cardiovascular;  Laterality: N/A;  . LUMBAR LAMINECTOMY/DECOMPRESSION MICRODISCECTOMY Right 05/13/2014   Procedure: LUMBAR LAMINECTOMY/DECOMPRESSION MICRODISCECTOMY 1 LEVEL  lumbar four/five;  Surgeon: Faythe Ghee, MD;  Location: [facility] NEURO ORS;  Service: Neurosurgery;  Laterality: Right;  . MITRAL VALVE REPLACEMENT N/A 07/18/2013   Procedure: MITRAL VALVE (MV) REPLACEMENT;  Surgeon: Gaye Pollack, MD;  Location: [place] OR;  Service: Open Heart Surgery;  Laterality: N/A;  . MITRAL VALVE [place], closed mitral valvulotomy by finger fracture  . TEE WITHOUT  CARDIOVERSION  01/24/2012   Procedure: TRANSESOPHAGEAL ECHOCARDIOGRAM (TEE);  Surgeon: Lelon Perla, MD;  Location: [facility] ENDOSCOPY;  Service: Cardiovascular;  Laterality: N/A;  . TEE WITHOUT CARDIOVERSION N/A 07/11/2013   Procedure: TRANSESOPHAGEAL ECHOCARDIOGRAM (TEE);  Surgeon: Thayer Headings, MD;  Location: [place];  Service: Cardiovascular;  Laterality: N/A;  . TEE WITHOUT CARDIOVERSION N/A 07/26/2013   Procedure: TRANSESOPHAGEAL ECHOCARDIOGRAM (TEE);  Surgeon: Fay Records, MD;  Location: [facility] ENDOSCOPY;  Service: Cardiovascular;  Laterality: N/A;  . TUBAL LIGATION  1973     HPI  from the history and physical done on the day of admission:   Chief Complaint: Passing out  HPI: [name] is a 74 y.o. female with medical history significant for HF, mitral and aortic valve replacement, atrial fibrillation and  cardioembolic stroke on chronic anticoagulation, hypertension. Patient was brought to the ED via EMS reports patient passed out while she was gardening outside under the hot sun.  Patient was standing and working in the garden and the next thing she knows she woke up on the ground.  She reports she had been working outside under the hot sun for at least 30 minutes.  She does not know how long she was out of consciousness.  The person who mows their lawn found patient on the floor. Patient denies prior dizziness.  She denies prodrome she did not know she was going to pass out.  Patient denies difficulty breathing, no cough no chest pain.  No vomiting, no loose stools, she has maintained good oral intake.  Spouse reports patient has felt generalized weakness over the past few weeks.  She saw her primary care provider, for this, but everything has checked out. No prior episodes of losing consciousness.  ED Course: T-max 99.3, tachycardic to 208, blood pressure 10 9-1 1 3.  O2 sats > 92% on room air.  WBC 8.9.  Potassium 3.2.  Creatinine mildly elevated at 1.49.  Blood glucose 168.   Portable chest x-ray and head CT without acute abnormality.  EKG unchanged from prior.  Troponin 13.  1 L bolus given.  Hospitalist admit for syncope.  Review of Systems: As per HPI all other systems reviewed and negative       Hospital Course:     Brief Summary:- 74 y.o. female with medical history significant for HF, mitral and aortic valve replacement, atrial fibrillation and  cardioembolic stroke on chronic anticoagulation, hypertension admitted on 04/30/2020 after a presumed syncopal event while gardening outside in the hot sun  A/p 1)Syncope- on telemetry monitor no significant arrhythmias, serial troponins and EKG not consistent with ACS =-- echocardiogram w/o  significant aortic stenosis (bioprosthetic valve noted), no  other outflow obstruction, and  EF is 60 to 65% ,  No segmental/Regional wall motion abnormalities.  carotid artery Dopplers from 05/29/17 w/o hemodynamically significant stenosis -CT Head without acute findings --- As per patient's husband patient appears to be back at her baseline -Patient states she feels good, ambulating without dizziness or dyspnea on exertion  2) history of mitral and aortic valve replacement--echo noted, continue Coumadin, repeat PT/INR on Monday, 11/23/2019 with PCP  3) chronic atrial fibrillation----continue metoprolol and Cardizem for rate control, continue Coumadin for secondary stroke prophylaxis  4)AKI--usual creatinine 1.49 baseline around 1, creatinine down to 1.20 at this time -Repeat BMP with PCP on Monday, 11/23/2019  5)HFpEF--history of chronic diastolic dysfunction CHF, repeat echo from 05/01/2020 noted as above #1 -On admission patient was dehydrated -Volume status appears to be euvolemic at this time -Resume PTA medications including diuretics post discharge  6)DM2-A1c 7.4, reflecting uncontrolled DM, resume Metformin and lifestyle and diet modifications advised    Discharge Condition: stable  Follow UP     Consults  obtained - na  Diet and Activity recommendation:  As advised  Discharge Instructions   Discharge Instructions    Call MD for:  difficulty breathing, headache or visual disturbances   Complete by: As directed    Call MD for:  persistant dizziness or light-headedness   Complete by: As directed    Call MD for:  persistant nausea and vomiting   Complete by: As directed    Call MD for:  severe uncontrolled pain   Complete by: As directed    Call MD for:  temperature >100.4   Complete by: As directed    Diet - low sodium heart healthy   Complete by: As directed    Discharge instructions   Complete by: As directed    1)You are taking Coumadin/warfarin for blood thinner so please Avoid ibuprofen/Advil/Aleve/Motrin/Goody Powders/Naproxen/BC powders/Meloxicam/Diclofenac/Indomethacin and other Nonsteroidal anti-inflammatory medications as these will make you more likely to bleed and can cause stomach ulcers, can also cause Kidney problems.   2) please follow-up with PCP for repeat CBC, BMP and PT/INR on Monday, 05/04/2020  3) the results of your test/work-up including your Heart echocardiogram has been reassuring  4) please avoid excessive heat exposure and dehydration   Increase activity slowly   Complete by: As directed         Discharge Medications     Allergies as of 05/01/2020      Reactions   Amiodarone Rash      Medication List    STOP taking these medications   benzonatate 100 MG capsule Commonly known as: TESSALON   HYDROcodone-homatropine 5-1.5 MG/5ML syrup Commonly known as: HYCODAN     TAKE these medications   atorvastatin 40 MG tablet Commonly known as: LIPITOR TAKE ONE TABLET BY MOUTH ONCE DAILY. What changed: when to take this   cefdinir 300 MG capsule Commonly known as: OMNICEF Take 300 mg by mouth 2 (two) times daily. 7 DAY COURSE STARTING ON 04/27/2020   diltiazem 180 MG 24 hr capsule Commonly known as: CARDIZEM CD Take 1 capsule (180 mg total) by  mouth daily.   escitalopram 10 MG tablet Commonly known as: LEXAPRO Take 10 mg by mouth daily.   fexofenadine 30 MG tablet Commonly known as: ALLEGRA Take 30 mg by mouth daily.   Flovent HFA 44 MCG/ACT inhaler Generic drug: fluticasone Inhale 2 puffs into the lungs 2 (two) times daily.   fluticasone 50 MCG/ACT nasal spray Commonly known as: FLONASE Place 1 spray into both nostrils daily as needed for allergies.  furosemide 40 MG tablet Commonly known as: LASIX TAKE ONE TABLET BY MOUTH ONCE DAILY.MAY TAKE AN ADDITIONAL TABLET AT BEDTIME AS NEEDED FOR SWELLING. What changed: See the new instructions.   levothyroxine 100 MCG tablet Commonly known as: SYNTHROID Take 100 mcg by mouth daily before breakfast.   metFORMIN 500 MG tablet Commonly known as: GLUCOPHAGE Take 1 tablet (500 mg total) by mouth 2 (two) times daily with a meal.   metoprolol tartrate 25 MG tablet Commonly known as: LOPRESSOR TAKE (1) TABLET BY MOUTH TWICE DAILY. What changed: See the new instructions.   multivitamin with minerals Tabs tablet Take 1 tablet by mouth daily.   olmesartan 20 MG tablet Commonly known as: Benicar Take 0.5 tablets (10 mg total) by mouth daily.   potassium chloride SA 20 MEQ tablet Commonly known as: KLOR-CON Take 20 mEq by mouth daily.   Ventolin HFA 108 (90 Base) MCG/ACT inhaler Generic drug: albuterol Inhale 2 puffs into the lungs every 4 (four) hours as needed for wheezing or shortness of breath.   warfarin 2.5 MG tablet Commonly known as: COUMADIN Take as directed. If you are unsure how to take this medication, talk to your nurse or doctor. Original instructions: TAKE AS DIRECTED BY COUMADIN CLINIC. What changed: See the new instructions.       Major procedures and Radiology Reports - PLEASE review detailed and final reports for all details, in brief -     CT Head Wo Contrast  Result Date: 04/30/2020 CLINICAL DATA:  Head trauma, mod-severe Found passed out  in backyard. EXAM: CT HEAD WITHOUT CONTRAST TECHNIQUE: Contiguous axial images were obtained from the base of the skull through the vertex without intravenous contrast. COMPARISON:  Head CT 11/12/2016 FINDINGS: Brain: No evidence of acute infarction, hemorrhage, hydrocephalus, extra-axial collection or mass lesion/mass effect. Generalized atrophy and chronic small vessel ischemia are unchanged. Remote left cerebellar and right frontal infarcts are again seen. Vascular: Atherosclerosis of skullbase vasculature without hyperdense vessel or abnormal calcification. Skull: No fracture or focal lesion. Sinuses/Orbits: Paranasal sinuses and mastoid air cells are clear. The visualized orbits are unremarkable. Other: None. IMPRESSION: 1. No acute intracranial abnormality. No skull fracture. 2. Unchanged atrophy, chronic small vessel ischemia, and remote right frontal and left cerebellar infarcts. Electronically Signed   By: Keith Rake M.D.   On: 04/30/2020 15:36   DG Chest Portable 1 View  Result Date: 04/30/2020 CLINICAL DATA:  Syncope. EXAM: PORTABLE CHEST 1 VIEW COMPARISON:  April 01, 2020 FINDINGS: Multiple sternal wires are seen. Mild, chronic appearing increased lung markings are present without evidence of acute infiltrate, pleural effusion or pneumothorax. The cardiac silhouette is borderline in size. Two artificial cardiac valves are again seen. Radiopaque surgical clips are seen along the lateral aspect of the right hemithorax. The visualized skeletal structures are unremarkable. IMPRESSION: 1. Evidence of prior median sternotomy/CABG. 2. Stable artificial cardiac valves. Electronically Signed   By: Virgina Norfolk M.D.   On: 04/30/2020 15:02   ECHOCARDIOGRAM COMPLETE  Result Date: 05/01/2020    ECHOCARDIOGRAM REPORT   Patient Name:   [name] Date of Exam: 05/01/2020 Medical Rec #:  [id number]       Height:       62.0 in Accession #:    [id number]      Weight:       178.6 lb Date of Birth:   Jan 09, 1946       BSA:          1.822 m Patient Age:  73 years        BP:           114/70 mmHg Patient Gender: F               HR:           102 bpm. Exam Location:  [facility] Procedure: 2D Echo, Cardiac Doppler and Color Doppler Indications:    Syncope  History:        Patient has prior history of Echocardiogram examinations, most                 recent 06/08/2018. CHF, Aortic Valve Disease and Mitral Valve                 Disease, Arrythmias:Atrial Fibrillation,                 Signs/Symptoms:Shortness of Breath; Risk Factors:Hypertension                 and Dyslipidemia. Breast cancer.                 Aortic Valve: 21 mm Magna Ease bioprosthetic valve is present in                 the aortic position.                 Mitral Valve: 25 mm Magna Ease bioprosthetic valve valve is                 present in the mitral position.  Sonographer:    Dustin Flock RDCS Referring Phys: [place]  1. Left ventricular ejection fraction, by estimation, is 60 to 65%. The left ventricle has normal function. The left ventricle has no regional wall motion abnormalities. Left ventricular diastolic parameters are indeterminate.  2. Right ventricular systolic function is low normal. The right ventricular size is normal. There is moderately elevated pulmonary artery systolic pressure. The estimated right ventricular systolic pressure is 09.3 mmHg.  3. Left atrial size was mildly dilated.  4. The mitral valve has been repaired/replaced. No evidence of mitral valve regurgitation. The mean mitral valve gradient is 5.0 mmHg. There is a 25 mm Magna Ease bioprosthetic valve present in the mitral position. Grossly normal function.  5. The aortic valve has been repaired/replaced. Aortic valve regurgitation is not visualized. There is a 21 mm Magna Ease bioprosthetic valve present in the aortic position. Aortic valve mean gradient measures 7.0 mmHg. Grossly normal function.  6. The inferior vena cava is normal  in size with greater than 50% respiratory variability, suggesting right atrial pressure of 3 mmHg. FINDINGS  Left Ventricle: Left ventricular ejection fraction, by estimation, is 60 to 65%. The left ventricle has normal function. The left ventricle has no regional wall motion abnormalities. The left ventricular internal cavity size was normal in size. There is  no left ventricular hypertrophy. Left ventricular diastolic parameters are indeterminate. Right Ventricle: The right ventricular size is normal. No increase in right ventricular wall thickness. Right ventricular systolic function is low normal. There is moderately elevated pulmonary artery systolic pressure. The tricuspid regurgitant velocity  is 3.26 m/s, and with an assumed right atrial pressure of 3 mmHg, the estimated right ventricular systolic pressure is 23.5 mmHg. Left Atrium: Left atrial size was mildly dilated. Right Atrium: Right atrial size was normal in size. Pericardium: There is no evidence of pericardial effusion. Presence of pericardial fat pad. Mitral Valve: The mitral valve has been  repaired/replaced. No evidence of mitral valve regurgitation. There is a 25 mm Magna Ease bioprosthetic valve present in the mitral position. MV peak gradient, 16.5 mmHg. The mean mitral valve gradient is 5.0 mmHg. Tricuspid Valve: The tricuspid valve is grossly normal. Tricuspid valve regurgitation is trivial. Aortic Valve: The aortic valve has been repaired/replaced. Aortic valve regurgitation is not visualized. Aortic valve mean gradient measures 7.0 mmHg. Aortic valve peak gradient measures 14.4 mmHg. Aortic valve area, by VTI measures 2.42 cm. There is a 21 mm Magna Ease bioprosthetic valve present in the aortic position. Pulmonic Valve: The pulmonic valve was grossly normal. Pulmonic valve regurgitation is trivial. Aorta: The aortic root is normal in size and structure. Venous: The inferior vena cava is normal in size with greater than 50% respiratory  variability, suggesting right atrial pressure of 3 mmHg. IAS/Shunts: No atrial level shunt detected by color flow Doppler.  LEFT VENTRICLE PLAX 2D LVIDd:         4.22 cm  Diastology LVIDs:         2.38 cm  LV e' lateral:   5.77 cm/s LV PW:         1.03 cm  LV E/e' lateral: 34.0 LV IVS:        0.88 cm  LV e' medial:    6.53 cm/s LVOT diam:     2.00 cm  LV E/e' medial:  30.0 LV SV:         94 LV SV Index:   51 LVOT Area:     3.14 cm  RIGHT VENTRICLE RV Basal diam:  3.56 cm RV S prime:     6.85 cm/s TAPSE (M-mode): 2.0 cm LEFT ATRIUM             Index       RIGHT ATRIUM           Index LA diam:        4.10 cm 2.25 cm/m  RA Area:     16.80 cm LA Vol (A2C):   69.4 ml 38.09 ml/m RA Volume:   44.10 ml  24.21 ml/m LA Vol (A4C):   72.3 ml 39.69 ml/m LA Biplane Vol: 73.8 ml 40.51 ml/m  AORTIC VALVE AV Area (Vmax):    2.22 cm AV Area (Vmean):   2.03 cm AV Area (VTI):     2.42 cm AV Vmax:           190.00 cm/s AV Vmean:          128.000 cm/s AV VTI:            0.387 m AV Peak Grad:      14.4 mmHg AV Mean Grad:      7.0 mmHg LVOT Vmax:         134.00 cm/s LVOT Vmean:        82.700 cm/s LVOT VTI:          0.298 m LVOT/AV VTI ratio: 0.77  AORTA Ao Root diam: 2.50 cm MITRAL VALVE                TRICUSPID VALVE MV Area (PHT): 2.50 cm     TR Peak grad:   42.5 mmHg MV Peak grad:  16.5 mmHg    TR Vmax:        326.00 cm/s MV Mean grad:  5.0 mmHg MV Vmax:       2.03 m/s     SHUNTS MV Vmean:      98.8 cm/s  Systemic VTI:  0.30 m MV Decel Time: 303 msec     Systemic Diam: 2.00 cm MV E velocity: 196.00 cm/s MV A velocity: 61.90 cm/s MV E/A ratio:  3.17 Rozann Lesches MD Electronically signed by Rozann Lesches MD Signature Date/Time: 05/01/2020/1:56:19 PM    Final     Micro Results    Recent Results (from the past 240 hour(s))  SARS Coronavirus 2 by RT PCR (hospital order, performed in [facility] hospital lab) Nasopharyngeal Nasopharyngeal Swab     Status: None   Collection Time: 04/30/20  3:24 PM   Specimen:  Nasopharyngeal Swab  Result Value Ref Range Status   SARS Coronavirus 2 NEGATIVE NEGATIVE Final    Comment: (NOTE) SARS-CoV-2 target nucleic acids are NOT DETECTED.  The SARS-CoV-2 RNA is generally detectable in upper and lower respiratory specimens during the acute phase of infection. The lowest concentration of SARS-CoV-2 viral copies this assay can detect is 250 copies / mL. A negative result does not preclude SARS-CoV-2 infection and should not be used as the sole basis for treatment or other patient management decisions.  A negative result may occur with improper specimen collection / handling, submission of specimen other than nasopharyngeal swab, presence of viral mutation(s) within the areas targeted by this assay, and inadequate number of viral copies (<250 copies / mL). A negative result must be combined with clinical observations, patient history, and epidemiological information.  Fact Sheet for Patients:   StrictlyIdeas.no  Fact Sheet for Healthcare Providers: BankingDealers.co.za  This test is not yet approved or  cleared by the [place] FDA and has been authorized for detection and/or diagnosis of SARS-CoV-2 by FDA under an Emergency Use Authorization (EUA).  This EUA will remain in effect (meaning this test can be used) for the duration of the COVID-19 declaration under Section 564(b)(1) of the Act, 21 U.S.C. section 360bbb-3(b)(1), unless the authorization is terminated or revoked sooner.  Performed at [facility], [address] [place], [place] [address]    Today   Subjective   [name] today has no new complaints        -Husband at bedside, questions answered  --patient ambulated around the room without dizziness or dyspnea on exertion    Patient has been seen and examined prior to discharge   Objective   Blood pressure 114/70, pulse 102, temperature 99.3 F (37.4 C), temperature source Oral,  resp. rate 15, height 5\' 2"  (1.575 m), weight 81 kg, SpO2 96 %.  No intake or output data in the 24 hours ending 05/01/20 1443  Exam  Gen:- Awake Alert, no acute distress  HEENT:- [place].AT, No sclera icterus Neck-Supple Neck,No JVD,.  Lungs-  CTAB , good air movement bilaterally  CV- S1, S2 normal, regular Abd-  +ve B.Sounds, Abd Soft, No tenderness,    Extremity/Skin:- No  edema,   good pulses Psych-affect is appropriate, oriented x3 Neuro-no new focal deficits, no tremors    Data Review   CBC w Diff:  Lab Results  Component Value Date   WBC 8.9 04/30/2020   HGB 12.1 04/30/2020   HCT 37.6 04/30/2020   PLT 204 04/30/2020   LYMPHOPCT 9 04/30/2020   MONOPCT 9 04/30/2020   EOSPCT 1 04/30/2020   BASOPCT 1 04/30/2020    CMP:  Lab Results  Component Value Date   NA 138 05/01/2020   K 4.7 05/01/2020   CL 109 05/01/2020   CO2 21 (L) 05/01/2020   BUN 15 05/01/2020   CREATININE 1.20 (  H) 05/01/2020   CREATININE 0.89 08/08/2013   PROT 6.9 04/30/2020   ALBUMIN 3.9 04/30/2020   BILITOT 0.9 04/30/2020   ALKPHOS 54 04/30/2020   AST 25 04/30/2020   ALT 22 04/30/2020  . Total Discharge time is about 33 minutes  Roxan Hockey M.D on 05/01/2020 at 2:43 PM  Go to [email or web address] -  for contact info  [facility] - Office  [phone]

## 2020-05-01 NOTE — Discharge Instructions (Signed)
1)You are taking Coumadin/warfarin for blood thinner so please Avoid ibuprofen/Advil/Aleve/Motrin/Goody Powders/Naproxen/BC powders/Meloxicam/Diclofenac/Indomethacin and other Nonsteroidal anti-inflammatory medications as these will make you more likely to bleed and can cause stomach ulcers, can also cause Kidney problems.   2)Please follow-up with PCP for repeat CBC, BMP and PT/INR on Monday, 05/04/2020  3)The results of your test/work-up including your Heart echocardiogram has been reassuring  4)Please avoid excessive heat exposure and dehydration

## 2020-05-01 NOTE — Progress Notes (Signed)
ANTICOAGULATION CONSULT NOTE -  Pharmacy Consult for warfarin Indication: atrial fibrillation  Allergies  Allergen Reactions  . Amiodarone Rash    Patient Measurements: Height: 5\' 2"  (157.5 cm) Weight: 81 kg (178 lb 9.2 oz) IBW/kg (Calculated) : 50.1  Vital Signs: BP: 114/70 (07/30 0638) Pulse Rate: 102 (07/30 0638)  Labs: Recent Labs    04/30/20 1405 04/30/20 1628 05/01/20 0548  HGB 12.1  --   --   HCT 37.6  --   --   PLT 204  --   --   LABPROT 21.8*  --  20.4*  INR 2.0*  --  1.8*  CREATININE 1.49*  --  1.20*  CKTOTAL  --  97  --   TROPONINIHS 13 16  --     Estimated Creatinine Clearance: 40 mL/min (A) (by C-G formula based on SCr of 1.2 mg/dL (H)).   Medical History: Past Medical History:  Diagnosis Date  . Anemia   . Atrial fibrillation ([place])   . Breast carcinoma ([place]) 1995   1995  . Cardioembolic stroke ([place]) 0/2725   Right frontal in 01/2012; normal carotid ultrasound; possible LAA thrombus by TEE; virtual complete neurologic recovery  . Chronic kidney disease, stage 2, mildly decreased GFR    GFR of approximately 60  . Depression   . Diverticulosis of colon (without mention of hemorrhage) 2012   Dr. Laural Golden  . Fasting hyperglycemia    120 fasting  . Gastroesophageal reflux disease   . Gastroparesis   . Hemorrhoids   . Hyperlipidemia   . Hypertension    pt denies 05/30/13     Dr Kathe Mariner  . Hyponatremia   . Rheumatic heart disease    a. 07/2013 Echo: Ef 55-60%, Mod AS/AI, Mod-Sev MS, sev dil LA, PASP 58;  b. 07/2013 TEE: EF 35-40%, mild-mod AS/AI, mod-sev MS, LA smoke;  c. 07/2013 Cath: elev R heart pressures, nl cors.  . Shortness of breath     Assessment: 74 y.o. female with history of mitral and aortic valve replacement (bioprosthetic) and atrial fibrillation presenting with syncope. Patient takes warfarin prior to admission, home dose 3.75 mg every Sun, Tue, Thu and 2.5mg  all other days of the week; last PTA dose 7/28 at 1800. Pharmacy  has been consulted to dose warfarin.  INR is subtherapeutic at 1.9. Hgb and platelets within normal limits. No overt bleeding noted.  Goal of Therapy:  Goal INR: 2.5-3.5 Monitor platelets by anticoagulation protocol: Yes  Plan:  Warfarin 5mg  today x1 Monitor CBC, PT/INR daily Continue to monitor for signs/symptoms of bleeding Continue to monitor for drug/food interactions that affect INR  Isac Sarna, BS Vena [place], BCPS Clinical Pharmacist Pager [phone]  05/01/2020   9:25 AM

## 2020-05-01 NOTE — Progress Notes (Signed)
  Echocardiogram 2D Echocardiogram has been performed.  [name] 05/01/2020, 12:55 PM

## 2020-05-04 DIAGNOSIS — I480 Paroxysmal atrial fibrillation: Secondary | ICD-10-CM | POA: Diagnosis not present

## 2020-05-04 DIAGNOSIS — R05 Cough: Secondary | ICD-10-CM | POA: Diagnosis not present

## 2020-05-04 DIAGNOSIS — R55 Syncope and collapse: Secondary | ICD-10-CM | POA: Diagnosis not present

## 2020-05-06 ENCOUNTER — Ambulatory Visit (INDEPENDENT_AMBULATORY_CARE_PROVIDER_SITE_OTHER): Payer: Medicare PPO | Admitting: *Deleted

## 2020-05-06 DIAGNOSIS — Z5181 Encounter for therapeutic drug level monitoring: Secondary | ICD-10-CM | POA: Diagnosis not present

## 2020-05-06 DIAGNOSIS — I4891 Unspecified atrial fibrillation: Secondary | ICD-10-CM | POA: Diagnosis not present

## 2020-05-06 DIAGNOSIS — I639 Cerebral infarction, unspecified: Secondary | ICD-10-CM

## 2020-05-06 LAB — POCT INR: INR: 1.6 — AB (ref 2.0–3.0)

## 2020-05-06 NOTE — Patient Instructions (Signed)
Take 2 tablets tonight and tomorrow night then increase dose to 1 1/2 tablets daily except 1 tablet on Mondays, Wednesdays and Fridays Recheck in 1 wk

## 2020-05-12 ENCOUNTER — Encounter: Payer: Self-pay | Admitting: Cardiology

## 2020-05-12 ENCOUNTER — Ambulatory Visit (INDEPENDENT_AMBULATORY_CARE_PROVIDER_SITE_OTHER): Payer: Medicare PPO | Admitting: Cardiology

## 2020-05-12 ENCOUNTER — Ambulatory Visit (INDEPENDENT_AMBULATORY_CARE_PROVIDER_SITE_OTHER): Payer: Medicare PPO | Admitting: *Deleted

## 2020-05-12 VITALS — BP 92/50 | HR 60 | Ht 62.5 in | Wt 180.0 lb

## 2020-05-12 DIAGNOSIS — I639 Cerebral infarction, unspecified: Secondary | ICD-10-CM

## 2020-05-12 DIAGNOSIS — R55 Syncope and collapse: Secondary | ICD-10-CM | POA: Diagnosis not present

## 2020-05-12 DIAGNOSIS — I4891 Unspecified atrial fibrillation: Secondary | ICD-10-CM | POA: Diagnosis not present

## 2020-05-12 DIAGNOSIS — Z5181 Encounter for therapeutic drug level monitoring: Secondary | ICD-10-CM | POA: Diagnosis not present

## 2020-05-12 LAB — POCT INR: INR: 2 (ref 2.0–3.0)

## 2020-05-12 MED ORDER — FUROSEMIDE 20 MG PO TABS
ORAL_TABLET | ORAL | 1 refills | Status: DC
Start: 2020-05-12 — End: 2020-06-25

## 2020-05-12 NOTE — Patient Instructions (Signed)
Your physician recommends that you schedule a follow-up appointment in: [place], NP  Your physician has recommended you make the following change in your medication:   STOP BENICAR   CHANGE LASIX 20 MG AS NEEDED FOR SWELLING   RECORD BLOOD PRESSURE FOR 1 WEEK AND CALL us WITH READINGS   Thank you for choosing [facility]!!

## 2020-05-12 NOTE — Patient Instructions (Signed)
Take 2 tablets tonight then increase dose to 1 1/2 tablets daily Recheck in 2 wks

## 2020-05-12 NOTE — Progress Notes (Signed)
Clinical Summary Ms. Coleson is a 74 y.o.female seen today for follow up of the following medical problems. This is a focused visit on recent admission for syncope.     1. Syncope - admission 04/2020 with syncope - no acute findings by echo - CT head benign - Cr was elevated to 1.49  - weight loss - orthostatics show significant rise in HR with standing, HR lying 60 to 112 standing. Soft bp's that are low with standing to 86/50.   - ongoing fatigue x 3 months - some cough, SOB  - in garden picking beans, not sure how long. In the 90s that day - denies any significant prodrome.   - glassses x 4, coffee x 1 cup decaf, diet sun drop x 2 cans, decaf tea, no EtoH - chronic issues with orthostatic dizziness with standing.   SH: daughter with Crohns with disease, they spend a lot of time supporting her   Past Medical History:  Diagnosis Date  . Anemia   . Atrial fibrillation ([place])   . Breast carcinoma ([place]) 1995   1995  . Cardioembolic stroke ([place]) 04/8675   Right frontal in 01/2012; normal carotid ultrasound; possible LAA thrombus by TEE; virtual complete neurologic recovery  . Chronic kidney disease, stage 2, mildly decreased GFR    GFR of approximately 60  . Depression   . Diverticulosis of colon (without mention of hemorrhage) 2012   Dr. Laural Golden  . Fasting hyperglycemia    120 fasting  . Gastroesophageal reflux disease   . Gastroparesis   . Hemorrhoids   . Hyperlipidemia   . Hypertension    pt denies 05/30/13     Dr Kathe Mariner  . Hyponatremia   . Rheumatic heart disease    a. 07/2013 Echo: Ef 55-60%, Mod AS/AI, Mod-Sev MS, sev dil LA, PASP 58;  b. 07/2013 TEE: EF 35-40%, mild-mod AS/AI, mod-sev MS, LA smoke;  c. 07/2013 Cath: elev R heart pressures, nl cors.  . Shortness of breath      Allergies  Allergen Reactions  . Amiodarone Rash     Current Outpatient Medications  Medication Sig Dispense Refill  . albuterol (VENTOLIN HFA) 108 (90 BASE)  MCG/ACT inhaler Inhale 2 puffs into the lungs every 4 (four) hours as needed for wheezing or shortness of breath.     Marland Kitchen atorvastatin (LIPITOR) 40 MG tablet TAKE ONE TABLET BY MOUTH ONCE DAILY. (Patient taking differently: Take 40 mg by mouth every evening. ) 90 tablet 3  . cefdinir (OMNICEF) 300 MG capsule Take 300 mg by mouth 2 (two) times daily. 7 DAY COURSE STARTING ON 04/27/2020    . diltiazem (CARDIZEM CD) 180 MG 24 hr capsule Take 1 capsule (180 mg total) by mouth daily. 30 capsule 1  . escitalopram (LEXAPRO) 10 MG tablet Take 10 mg by mouth daily.     . fexofenadine (ALLEGRA) 30 MG tablet Take 30 mg by mouth daily.     Marland Kitchen FLOVENT HFA 44 MCG/ACT inhaler Inhale 2 puffs into the lungs 2 (two) times daily.     . fluticasone (FLONASE) 50 MCG/ACT nasal spray Place 1 spray into both nostrils daily as needed for allergies.     . furosemide (LASIX) 40 MG tablet TAKE ONE TABLET BY MOUTH ONCE DAILY.MAY TAKE AN ADDITIONAL TABLET AT BEDTIME AS NEEDED FOR SWELLING. (Patient taking differently: Take 40 mg by mouth daily. *mAY TAKE ONE ADDITIONAL AS NEEDED AT BEDTIME) 180 tablet 0  . levothyroxine (SYNTHROID) 100  MCG tablet Take 100 mcg by mouth daily before breakfast.     . metFORMIN (GLUCOPHAGE) 500 MG tablet Take 1 tablet (500 mg total) by mouth 2 (two) times daily with a meal. 60 tablet 1  . metoprolol tartrate (LOPRESSOR) 25 MG tablet TAKE (1) TABLET BY MOUTH TWICE DAILY. (Patient taking differently: Take 25 mg by mouth 2 (two) times daily. ) 180 tablet 1  . Multiple Vitamin (MULITIVITAMIN WITH MINERALS) TABS Take 1 tablet by mouth daily.    Marland Kitchen olmesartan (BENICAR) 20 MG tablet Take 0.5 tablets (10 mg total) by mouth daily. 45 tablet 3  . potassium chloride SA (K-DUR,KLOR-CON) 20 MEQ tablet Take 20 mEq by mouth daily.    Marland Kitchen warfarin (COUMADIN) 2.5 MG tablet TAKE AS DIRECTED BY COUMADIN CLINIC. (Patient taking differently: Take 2.5 mg by mouth See admin instructions. 2.5mg  on all days, except take 3.75mg  on  Sundays, Tuesdays, and Thursdays) 45 tablet 6   No current facility-administered medications for this visit.     Past Surgical History:  Procedure Laterality Date  . AORTIC VALVE REPLACEMENT N/A 07/18/2013   Procedure: AORTIC VALVE REPLACEMENT (AVR);  Surgeon: Gaye Pollack, MD;  Location: [facility];  Service: Open Heart Surgery;  Laterality: N/A;  . BACK SURGERY    . BREAST LUMPECTOMY Right 1995  . CARDIAC CATHETERIZATION    . CARDIOVERSION N/A 07/26/2013   Procedure: CARDIOVERSION;  Surgeon: Fay Records, MD;  Location[facility] ENDOSCOPY;  Service: Cardiovascular;  Laterality: N/A;  . COLONOSCOPY  2012   Negative screening procedure  . DILATION AND CURETTAGE OF UTERUS    . ESOPHAGEAL MANOMETRY N/A 06/17/2013   Procedure: ESOPHAGEAL MANOMETRY (EM);  Surgeon: Sable Feil, MD;  Location: [facility] ENDOSCOPY;  Service: Endoscopy;  Laterality: N/A;  . INTRAOPERATIVE TRANSESOPHAGEAL ECHOCARDIOGRAM N/A 07/18/2013   Procedure: INTRAOPERATIVE TRANSESOPHAGEAL ECHOCARDIOGRAM;  Surgeon: Gaye Pollack, MD;  Location: [facility] OR;  Service: Open Heart Surgery;  Laterality: N/A;  . LEFT AND RIGHT HEART CATHETERIZATION WITH CORONARY ANGIOGRAM N/A 07/10/2013   Procedure: LEFT AND RIGHT HEART CATHETERIZATION WITH CORONARY ANGIOGRAM;  Surgeon: Burnell Blanks, MD;  Location: [facility] CATH LAB;  Service: Cardiovascular;  Laterality: N/A;  . LUMBAR LAMINECTOMY/DECOMPRESSION MICRODISCECTOMY Right 05/13/2014   Procedure: LUMBAR LAMINECTOMY/DECOMPRESSION MICRODISCECTOMY 1 LEVEL  lumbar four/five;  Surgeon: Faythe Ghee, MD;  Location: [facility] NEURO ORS;  Service: Neurosurgery;  Laterality: Right;  . MITRAL VALVE REPLACEMENT N/A 07/18/2013   Procedure: MITRAL VALVE (MV) REPLACEMENT;  Surgeon: Gaye Pollack, MD;  Location: [facility] OR;  Service: Open Heart Surgery;  Laterality: N/A;  . MITRAL VALVE [place], closed mitral valvulotomy by finger fracture  . TEE WITHOUT CARDIOVERSION  01/24/2012   Procedure:  TRANSESOPHAGEAL ECHOCARDIOGRAM (TEE);  Surgeon: Lelon Perla, MD;  Location: [facility] ENDOSCOPY;  Service: Cardiovascular;  Laterality: N/A;  . TEE WITHOUT CARDIOVERSION N/A 07/11/2013   Procedure: TRANSESOPHAGEAL ECHOCARDIOGRAM (TEE);  Surgeon: Thayer Headings, MD;  Location: [facility];  Service: Cardiovascular;  Laterality: N/A;  . TEE WITHOUT CARDIOVERSION N/A 07/26/2013   Procedure: TRANSESOPHAGEAL ECHOCARDIOGRAM (TEE);  Surgeon: Fay Records, MD;  Location: [facility];  Service: Cardiovascular;  Laterality: N/A;  . TUBAL LIGATION  1973     Allergies  Allergen Reactions  . Amiodarone Rash      Family History  Problem Relation Age of Onset  . Heart disease Brother 56       MI  . Rheum arthritis Maternal Grandmother   . Asthma Maternal  Grandfather   . Hypothyroidism Mother   . Diabetes Sister   . Cirrhosis Father      Social History Ms. Janelle reports that she has never smoked. She has never used smokeless tobacco. Ms. Pelot reports no history of alcohol use.   Review of Systems CONSTITUTIONAL: No weight loss, fever, chills, weakness or fatigue.  HEENT: Eyes: No visual loss, blurred vision, double vision or yellow sclerae.No hearing loss, sneezing, congestion, runny nose or sore throat.  SKIN: No rash or itching.  CARDIOVASCULAR: per hpi RESPIRATORY: No shortness of breath, cough or sputum.  GASTROINTESTINAL: No anorexia, nausea, vomiting or diarrhea. No abdominal pain or blood.  GENITOURINARY: No burning on urination, no polyuria NEUROLOGICAL: No headache, dizziness, syncope, paralysis, ataxia, numbness or tingling in the extremities. No change in bowel or bladder control.  MUSCULOSKELETAL: No muscle, back pain, joint pain or stiffness.  LYMPHATICS: No enlarged nodes. No history of splenectomy.  PSYCHIATRIC: No history of depression or anxiety.  ENDOCRINOLOGIC: No reports of sweating, cold or heat intolerance. No polyuria or polydipsia.  Marland Kitchen   Physical  Examination Today's Vitals   05/12/20 0917  BP: (!) 92/50  Pulse: 60  SpO2: 98%  Weight: 180 lb (81.6 kg)  Height: 5' 2.5" (1.588 m)   Body mass index is 32.4 kg/m.  Gen: resting comfortably, no acute distress HEENT: no scleral icterus, pupils equal round and reactive, no palptable cervical adenopathy,  CV: RRR, 2/6 systolic murmur rusb Resp: Clear to auscultation bilaterally GI: abdomen is soft, non-tender, non-distended, normal bowel sounds, no hepatosplenomegaly MSK: extremities are warm, no edema.  Skin: warm, no rash Neuro:  no focal deficits Psych: appropriate affect   Diagnostic Studies 07/2013 TTE Study Conclusions  - Study data: Technically difficult study. - Left ventricle: The cavity size was normal. Wall thickness was normal. Systolic function was normal. The estimated ejection fraction was in the range of 55% to 60%. Indeterminate diastolic function. There is evidence of very high left atrial pressures (E/e' 63) - Aortic valve: Trileaflet; mildly thickened leaflets. Accurate evaluation of aortic stenosis limited in the setting of severe mitral stenosis and decreased ventricular loadin. Morphologically there does not appear to be significant stenosis. By gradient (mean PG 21 mmHg) the stenosis is moderate, by AVA VTI (0.8) stenosis is severe. Moderate regurgitation, AR PHT is 439 cm/s. Valve area: 0.78cm^2(VTI). Valve area: 0.78cm^2 (Vmax). - Mitral valve: The findings are consistent with moderate to severe stenosis. (Mean PG 12 mmHg, MVA by PHT 1.3. Cannot use MVA by VTI due to moderate AI. Valve area by pressure half-time: 1.38cm^2. - Left atrium: The atrium was severely dilated. - Right ventricle: Systolic function was mildly reduced, RV TAPSE is 1.4, TV systolic annular tissue velocity 10 cm/s. - Pulmonary arteries: Systolic pressure was moderately increased. PA peak pressure: 35mm Hg (S) (PASP based on a fairly faint  spectral Doppler waveform)   07/2013 TEE Study Conclusions  - Left ventricle: Systolic function was moderately reduced. The estimated ejection fraction was in the range of 35% to 40%. - Aortic valve: There was mild to moderate stenosis. Mild to moderate regurgitation. - Mitral valve: The findings are consistent with moderate to severe stenosis. Valve area by pressure half-time: 1.45cm^2. - Left atrium: There was spontaneous echo contrast ("smoke"). - Atrial septum: No defect or patent foramen ovale was identified. - Tricuspid valve: No evidence of vegetation. Transesophageal echocardiography. 2D and color Doppler. Height: Height: 157.5cm. Height: 62in. Weight: Weight: 75kg. Weight: 165lb. Body mass index: BMI: 30.2kg/m^2. Body  surface area:  BSA: 1.36m^2. Blood pressure: 143/56. Patient status: Inpatient. Location: Endoscopy.   06/2018 echo Study Conclusions  - Left ventricle: The cavity size was normal. Wall thickness was normal. Systolic function was normal. The estimated ejection fraction was in the range of 60% to 65%. - Aortic valve: AV prosthesis is difficult to see Peak and mean gradients through the valve are 18 and 10 mm Hg respectively. - Mitral valve: Peak and mean gradients through the valve prosthesis are 19 and 4 mm Hg respectively MVA by P T1/2 is 3 cm2. s/p MV prosthesis. Valve prosthesis is difficult to see. - Pulmonary arteries: PA peak pressure: 48 mm Hg (S).   04/2020 echo IMPRESSIONS    1. Left ventricular ejection fraction, by estimation, is 60 to 65%. The  left ventricle has normal function. The left ventricle has no regional  wall motion abnormalities. Left ventricular diastolic parameters are  indeterminate.  2. Right ventricular systolic function is low normal. The right  ventricular size is normal. There is moderately elevated pulmonary artery  systolic pressure. The estimated right ventricular  systolic pressure is  32.2 mmHg.  3. Left atrial size was mildly dilated.  4. The mitral valve has been repaired/replaced. No evidence of mitral  valve regurgitation. The mean mitral valve gradient is 5.0 mmHg. There is  a 25 mm Magna Ease bioprosthetic valve present in the mitral position.  Grossly normal function.  5. The aortic valve has been repaired/replaced. Aortic valve  regurgitation is not visualized. There is a 21 mm Magna Ease bioprosthetic  valve present in the aortic position. Aortic valve mean gradient measures  7.0 mmHg. Grossly normal function.  6. The inferior vena cava is normal in size with greater than 50%  respiratory variability, suggesting right atrial pressure of 3 mmHg.   Assessment and Plan  1. Syncope - history of data would suggest orthostatic syncope from dehydration related to diuretic use and being in hot temperatures - bp's low today, significantly orthostatic by pulse HR 60 to 110 with standing with low bps 86/50 with standing.  - change lasix to prn only 20mg . Stop benicar for now, if bp's stabilize with increased fluid status may restart given her DM2 history.  - if recurrent episodes despite improved bp's and fluid status would plan for outpatient monitor.  - I suspect overdiuresis for orthostatic changes, if persistent may need to consider autonomic dysfunction given her DM2 history but I think less likely.       Arnoldo Lenis, M.D.

## 2020-05-16 ENCOUNTER — Other Ambulatory Visit: Payer: Self-pay | Admitting: Cardiology

## 2020-05-26 ENCOUNTER — Ambulatory Visit (INDEPENDENT_AMBULATORY_CARE_PROVIDER_SITE_OTHER): Payer: Medicare PPO | Admitting: *Deleted

## 2020-05-26 DIAGNOSIS — Z952 Presence of prosthetic heart valve: Secondary | ICD-10-CM | POA: Diagnosis not present

## 2020-05-26 DIAGNOSIS — Z5181 Encounter for therapeutic drug level monitoring: Secondary | ICD-10-CM

## 2020-05-26 DIAGNOSIS — I639 Cerebral infarction, unspecified: Secondary | ICD-10-CM

## 2020-05-26 DIAGNOSIS — I4891 Unspecified atrial fibrillation: Secondary | ICD-10-CM | POA: Diagnosis not present

## 2020-05-26 LAB — POCT INR: INR: 2.8 (ref 2.0–3.0)

## 2020-05-26 NOTE — Patient Instructions (Signed)
Continue warfarin 1 1/2 tablets daily. Recheck in 4 wks  

## 2020-06-23 ENCOUNTER — Ambulatory Visit: Payer: Medicare PPO | Admitting: Internal Medicine

## 2020-06-23 ENCOUNTER — Other Ambulatory Visit: Payer: Self-pay

## 2020-06-23 ENCOUNTER — Ambulatory Visit: Payer: Medicare PPO | Admitting: Family Medicine

## 2020-06-23 ENCOUNTER — Encounter: Payer: Self-pay | Admitting: Internal Medicine

## 2020-06-23 DIAGNOSIS — R0600 Dyspnea, unspecified: Secondary | ICD-10-CM

## 2020-06-23 DIAGNOSIS — R05 Cough: Secondary | ICD-10-CM

## 2020-06-23 DIAGNOSIS — R0609 Other forms of dyspnea: Secondary | ICD-10-CM

## 2020-06-23 DIAGNOSIS — R058 Other specified cough: Secondary | ICD-10-CM

## 2020-06-23 MED ORDER — FAMOTIDINE 20 MG PO TABS: ORAL_TABLET | ORAL | 11 refills | Status: DC

## 2020-06-23 MED ORDER — PANTOPRAZOLE SODIUM 40 MG PO TBEC: 40.0000 mg | DELAYED_RELEASE_TABLET | Freq: Every day | ORAL | 2 refills | Status: DC

## 2020-06-23 NOTE — Progress Notes (Signed)
[name], female    DOB: 1946-03-08,     MRN: [id number]   Brief patient profile:  74 yowf never smoker with a h/o sob related to valvular heart dz    Seen prior to Epic in [place]: Feb 20, 2009 initial pulmonary eval started aerobics 3 months prior to OV  X 30 min weekly and tolerated well with improving ex tol until 3 weeks prior to OV   noted decrease tolerance and since then gradually worse to point where walking to mailbox slt uphill or climbing steps has to stop at top.  In retrospect coughing for 3 months dry, worse immediately on lying down. due to the cough lisnopril was stopped and could not tolerate the substitute and cough hasn't changed but breathing got worse. prednisone no help so far and proaire no benefit. rec GERD diet   continue prilosec and reglan stop prednisone and proaire  avoid salt  maxzide 25 one each am and Dr Izell [place] [facility]   10/16/12 Meth challenge  + for hyperreactive airways disease  clance eval 05/14/13 [name] that at this point is clearly upper airway in origin.  [name], but her current cough failed to respond to prednisone tapers.  Therefore it is unlikely that it is related to cough variant asthma.  She does not have chronic sinusitis by her CT scan, and there is nothing on chest x-ray earlier in the year to explain her cough.  She does have a history of breast cancer, but there has been no evidence for recurrence.  [name], but this has an extremely low yield.  [name], and therefore I am concerned she may be having laryngopharyngeal reflux as a cause for her cough.  I also think she may have a cyclical mechanism as well she did have some response to tramadol, and we could consider a trial of gabapentin  AVR  07/18/13   04/2020 echo 1. Left ventricular ejection fraction, by estimation, is 60 to 65%. The  left  ventricle has normal function. The left ventricle has no regional  wall motion abnormalities. Left ventricular diastolic parameters are  indeterminate.  2. Right ventricular systolic function is low normal. The right  ventricular size is normal. There is moderately elevated pulmonary artery  systolic pressure. The estimated right ventricular systolic pressure is  46.9 mmHg.  3. Left atrial size was mildly dilated.  4. The mitral valve has been repaired/replaced. No evidence of mitral  valve regurgitation. The mean mitral valve gradient is 5.0 mmHg. There is  a 25 mm Magna Ease bioprosthetic valve present in the mitral position.  Grossly normal function.  5. The aortic valve has been repaired/replaced. Aortic valve  regurgitation is not visualized. There is a 21 mm Magna Ease bioprosthetic  valve present in the aortic position. Aortic valve mean gradient measures  7.0 mmHg. Grossly normal function.  6. The inferior vena cava is normal in size with greater than 50%  respiratory variability, suggesting right atrial pressure of 3 mmHg.       History of Present Illness  06/23/2020  Pulmonary/ 1st office eval/ [name] / [facility] / re establish re cough and sob on flovent 44 2bid  For cough variant asthma  Chief Complaint  Patient presents with  . Consult    Patient has a dry cough that has been going on for  many years but worse over the last 6 months. Shortness of breath with exertion.   Dyspnea:  Walks dog x 30 min / mostly flat uses cane tobalance/ steps ok slowly due to breathing problems x sev years prior to OV   Cough: worse in afternoons and sometimes hs has to sit up 30 degrees to prevent cough supine  Sleep: ok as long as head is up 30 degrees new x 2 years  SABA use: sev times a day seems to help at 4 pm  No obvious day to day or daytime variability or assoc excess/ purulent sputum or mucus plugs or hemoptysis or cp or chest tightness, subjective wheeze or overt sinus  or hb symptoms.   Sleeping as above  without nocturnal  or early am exacerbation  of respiratory  c/o's or need for noct saba. Also denies any obvious fluctuation of symptoms with weather or environmental changes or other aggravating or alleviating factors except as outlined above   No unusual exposure hx or h/o childhood pna/ asthma or knowledge of premature birth.  Current Allergies, Complete Past Medical History, Past Surgical History, Family History, and Social History were reviewed in [employer] record.  ROS  The following are not active complaints unless bolded Hoarseness, sore throat, dysphagia, dental problems, itching, sneezing,  nasal congestion or discharge of excess mucus or purulent secretions, ear ache,   fever, chills, sweats, unintended wt loss or wt gain, classically pleuritic or exertional cp,  orthopnea pnd or arm/hand swelling  or leg swelling, presyncope, palpitations, abdominal pain, anorexia, nausea, vomiting, diarrhea  or change in bowel habits or change in bladder habits, change in stools or change in urine, dysuria, hematuria,  rash, arthralgias, visual complaints, headache, numbness, weakness or ataxia or problems with walking or coordination,  change in mood or  memory.           Past Medical History:  Diagnosis Date  . Anemia   . Atrial fibrillation ([place])   . Breast carcinoma ([place]) 1995   1995  . Cardioembolic stroke ([place]) 12/8880   Right frontal in 01/2012; normal carotid ultrasound; possible LAA thrombus by TEE; virtual complete neurologic recovery  . Chronic kidney disease, stage 2, mildly decreased GFR    GFR of approximately 60  . Depression   . Diverticulosis of colon (without mention of hemorrhage) 2012   Dr. Laural Golden  . Fasting hyperglycemia    120 fasting  . Gastroesophageal reflux disease   . Gastroparesis   . Hemorrhoids   . Hyperlipidemia   . Hypertension    pt denies 05/30/13     Dr Kathe Mariner  . Hyponatremia   .  Rheumatic heart disease    a. 07/2013 Echo: Ef 55-60%, Mod AS/AI, Mod-Sev MS, sev dil LA, PASP 58;  b. 07/2013 TEE: EF 35-40%, mild-mod AS/AI, mod-sev MS, LA smoke;  c. 07/2013 Cath: elev R heart pressures, nl cors.  . Shortness of breath     Outpatient Medications Prior to Visit  Medication Sig Dispense Refill  . albuterol (VENTOLIN HFA) 108 (90 BASE) MCG/ACT inhaler Inhale 2 puffs into the lungs every 4 (four) hours as needed for wheezing or shortness of breath.     Marland Kitchen atorvastatin (LIPITOR) 40 MG tablet TAKE ONE TABLET BY MOUTH ONCE DAILY. 90 tablet 1  . diltiazem (CARDIZEM CD) 180 MG 24 hr capsule Take 1 capsule (180 mg total) by mouth daily. 30 capsule 1  . escitalopram (LEXAPRO) 10 MG tablet Take 10  mg by mouth daily.     . fexofenadine (ALLEGRA) 30 MG tablet Take 30 mg by mouth daily.     Marland Kitchen FLOVENT HFA 44 MCG/ACT inhaler Inhale 2 puffs into the lungs 2 (two) times daily.     . fluticasone (FLONASE) 50 MCG/ACT nasal spray Place 1 spray into both nostrils daily as needed for allergies.     . furosemide (LASIX) 20 MG tablet TAKE 1 TABLET DAILY AS NEEDED FOR SWELLING 90 tablet 1  . levothyroxine (SYNTHROID) 100 MCG tablet Take 100 mcg by mouth daily before breakfast.     . metFORMIN (GLUCOPHAGE) 500 MG tablet Take 1 tablet (500 mg total) by mouth 2 (two) times daily with a meal. 60 tablet 1  . metoprolol tartrate (LOPRESSOR) 25 MG tablet TAKE (1) TABLET BY MOUTH TWICE DAILY. (Patient taking differently: Take 25 mg by mouth 2 (two) times daily. ) 180 tablet 1  . Multiple Vitamin (MULITIVITAMIN WITH MINERALS) TABS Take 1 tablet by mouth daily.    . potassium chloride SA (K-DUR,KLOR-CON) 20 MEQ tablet Take 20 mEq by mouth daily.    Marland Kitchen warfarin (COUMADIN) 2.5 MG tablet TAKE AS DIRECTED BY COUMADIN CLINIC. (Patient taking differently: Take 2.5 mg by mouth See admin instructions. 2.5mg  on all days, except take 3.75mg  on Sundays, Tuesdays, and Thursdays) 45 tablet 6   No facility-administered  medications prior to visit.    Past Medical History:    Hx of NEOPLASM, MALIGNANT, BREAST (ICD-174.9)    HYPERLIPIDEMIA (ICD-272.4)    ASTHMA (ICD-493.90)    GERD with gastroparesis  Past Surgical History:    Breast surgery 1995    Heart valve surgery 1962 age 72 [facility] Mitral valve     Tubal ligation-1973   Objective:     BP 128/62 (BP Location: Left Arm, Patient Position: Sitting, Cuff Size: Large)   Pulse 82   Temp 97.6 F (36.4 C) (Temporal)   Ht 5\' 2"  (1.575 m)   Wt 180 lb (81.6 kg)   SpO2 95%   BMI 32.92 kg/m   SpO2: 95 %   Obese wf nad / slt hoarse with voice fatigue, throat clearing    HEENT : pt wearing mask not removed for exam due to covid -19 concerns.    NECK :  without JVD/Nodes/TM/ nl carotid upstrokes bilaterally   LUNGS: no acc muscle use,  Nl contour chest which is clear to A and P bilaterally without cough on insp or exp maneuvers   CV:  RRR  no s3 or murmur or increase in P2, and trace bilateral LE pitting edema   ABD:  soft and nontender with nl inspiratory excursion in the supine position. No bruits or organomegaly appreciated, bowel sounds nl  MS:  Nl gait/ ext warm without deformities, calf tenderness, cyanosis or clubbing No obvious joint restrictions   SKIN: warm and dry without lesions    NEURO:  alert, approp, nl sensorium with  no motor or cerebellar deficits apparent.      I personally reviewed images and agree with radiology impression as follows:  CXR:   04/30/20  1. Evidence of prior median sternotomy/CABG. 2. Stable artificial cardiac valves     Assessment   Upper airway cough syndrome in pt with mild cough variant asthma  Arlyce Harman 09/2012: normal  Sinus ct 2014:  No chronic sinus disease. 10/16/12 Meth challenge  + for hyperreactive airways disease - 06/23/2020  After extensive coaching inhaler device,  effectiveness =    75% from a  baseline of 25% > continue flovent 44 2bid and prn saba plus max gerd rx   Clearly has  components of both cough variant asthma and Upper airway cough syndrome (previously labeled PNDS),  is so named because it's frequently impossible to sort out how much is  CR/sinusitis with freq throat clearing (which can be related to primary GERD)   vs  causing  secondary (" extra esophageal")  GERD from wide swings in gastric pressure that occur with throat clearing, often  promoting self use of mint and menthol lozenges that reduce the lower esophageal sphincter tone and exacerbate the problem further in a cyclical fashion.   These are the same pts (now being labeled as having "irritable larynx syndrome" by some cough centers) who not infrequently have a history of having failed to tolerate ace inhibitors,  dry powder inhalers or biphosphonates or report having atypical/extraesophageal reflux symptoms that don't respond to standard doses of PPI  and are easily confused as having aecopd or asthma flares by even experienced allergists/ pulmonologists (myself included).    rec continue the lowest doses of ICS plus max for gerd for 6 weeks then return ? Needs trial of gabapentin next based on prev failure to respond to rx for asthma           Each maintenance medication was reviewed in detail including emphasizing most importantly the difference between maintenance and prns and under what circumstances the prns are to be triggered using an action plan format where appropriate.  Total time for H and P, chart review, counseling, teaching device and generating customized AVS unique to this office visit / charting =  > 60 min             Christinia Gully, MD 06/23/2020

## 2020-06-23 NOTE — Patient Instructions (Addendum)
GERD (REFLUX)  is an extremely common cause of respiratory symptoms just like yours , many times with no obvious heartburn at all.    It can be treated with medication, but also with lifestyle changes including elevation of the head of your bed (ideally with 6 -8inch blocks under the headboard of your bed),  Smoking cessation, avoidance of late meals, excessive alcohol, and avoid fatty foods, chocolate, peppermint, colas, red wine, and acidic juices such as orange juice.  NO MINT OR MENTHOL PRODUCTS SO NO COUGH DROPS  USE SUGARLESS CANDY INSTEAD (Jolley ranchers or Stover's or Life Savers) or even ice chips will also do - the key is to swallow to prevent all throat clearing. NO OIL BASED VITAMINS - use powdered substitutes.  Avoid fish oil when coughing.  Pantoprazole (protonix) 40 mg   Take  30-60 min before first meal of the day and Pepcid (famotidine)  20 mg one after supper until return to office - this is the best way to tell whether stomach acid is contributing to your problem.    Plan A = Automatic = Always=    flovent Take 2 puffs first thing in am and then another 2 puffs about 12 hours later.   Work on inhaler technique:  relax and gently blow all the way out then take a nice smooth deep breath back in, triggering the inhaler at same time you start breathing in.  Hold for up to 5 seconds if you can. Blow out thru nose. Rinse and gargle with water when done    Plan B = Backup (to supplement plan A, not to replace it) Only use your albuterol inhaler as a rescue medication to be used if you can't catch your breath or stop coughing    - The less you use it, the better it will work when you need it. - Ok to use the inhaler up to 2 puffs  every 4 hours if you must but call for appointment if use goes up over your usual need - Don't leave home without it !!  (think of it like the spare tire for your car)     Please schedule a follow up office visit in 6 weeks, call sooner if needed - bring  inhalers

## 2020-06-23 NOTE — Assessment & Plan Note (Signed)
[name] 09/2012: normal  Sinus ct 2014:  No chronic sinus disease. 10/16/12 Meth challenge  + for hyperreactive airways disease - 06/23/2020  After extensive coaching inhaler device,  effectiveness =    75% from a baseline of 25% > continue flovent 44 2bid and prn saba plus max gerd rx   Clearly has components of both cough variant asthma and Upper airway cough syndrome (previously labeled PNDS),  is so named because it's frequently impossible to sort out how much is  CR/sinusitis with freq throat clearing (which can be related to primary GERD)   vs  causing  secondary (" extra esophageal")  GERD from wide swings in gastric pressure that occur with throat clearing, often  promoting self use of mint and menthol lozenges that reduce the lower esophageal sphincter tone and exacerbate the problem further in a cyclical fashion.   These are the same pts (now being labeled as having "irritable larynx syndrome" by some cough centers) who not infrequently have a history of having failed to tolerate ace inhibitors,  dry powder inhalers or biphosphonates or report having atypical/extraesophageal reflux symptoms that don't respond to standard doses of PPI  and are easily confused as having aecopd or asthma flares by even experienced allergists/ pulmonologists (myself included).    rec continue the lowest doses of ICS plus max for gerd for 6 weeks then return ? Needs trial of gabapentin next based on prev failure to respond to rx for asthma           Each maintenance medication was reviewed in detail including emphasizing most importantly the difference between maintenance and prns and under what circumstances the prns are to be triggered using an action plan format where appropriate.  Total time for H and P, chart review, counseling, teaching device and generating customized AVS unique to this office visit / charting =  > 60 min

## 2020-06-24 NOTE — Progress Notes (Signed)
Cardiology Office Note  Date: 06/25/2020   ID: [name], [name] April 18, 1946, MRN [id number]  PCP:  [name], MD  Cardiologist:  Carlyle Dolly, MD Electrophysiologist:  None   Chief Complaint: Syncope and collapse follow-up  History of Present Illness: [name] is a 74 y.o. female with a history of syncope and collapse, atrial fibrillation, CVA, HTN, rheumatic heart disease, shortness of breath.  Recent admission with syncopal episode July 2021 with syncope.  CT of her head was benign. Echo showed no acute findings.  Had some recent weight loss, orthostatic vital signs showed significant rise in heart rate with standing from a heart rate of 60 while lying 112 with standing.  Blood pressures were low with standing at 86/50.  Had some ongoing fatigue during the previous 3 months.  Patient was in the garden the day of syncope with temperatures in the 90s.  Had no significant prodromal symptoms.  Issues with chronic orthostatic dizziness when standing.  Dr. Harl Bowie decrease Lasix to periodic and only 20 mg.  Benicar was stopped for the time being.  If recurrent episodes despite improved BPs and fluid status would plan for an outpatient monitor.  Suspicion was for overdiuresis for orthostatic changes.  If persistent may consider autonomic dysfunction given her DM2 history but this was thought to be less likely.  Patient is here for follow-up today status post syncopal episode.  At last visit Dr. Harl Bowie decreased her diuretics to 20 mg as needed.  Her husband who is with her states she has been taking 20 mg of Lasix at least 5 days out of 7 for lower extremity edema.  She has a chronic cough and has been worked up by pulmonology and allergy specialist.  She does have some chronic lower extremity edema.  Her blood pressure is elevated at 142/78 today.  Husband who is with her states usually blood pressures in the 132/74 range at home.  States her heart rate is usually in the 60s on average.   He states her O2 sats ranged from 95 to 98% on average.  She has had no further syncopal or near syncopal episodes or complaints of dizziness, lightheadedness.  States she stopped going out in the heat to work in the garden when she is at home by herself.  She denies any other anginal or exertional symptoms, palpitations or arrhythmias although she does have atrial fibrillation.  Denies any PND, orthopnea.  She does sleep in an incline position but does not sleep flat due to coughing.  Denies any claudication-like symptoms, DVT or PE-like symptoms.  She does have some chronic lower extremity edema.  Past Medical History:  Diagnosis Date  . Anemia   . Atrial fibrillation ([place])   . Breast carcinoma ([place]) 1995   1995  . Cardioembolic stroke ([place]) 01/2682   Right frontal in 01/2012; normal carotid ultrasound; possible LAA thrombus by TEE; virtual complete neurologic recovery  . Chronic kidney disease, stage 2, mildly decreased GFR    GFR of approximately 60  . Depression   . Diverticulosis of colon (without mention of hemorrhage) 2012   Dr. Laural Golden  . Fasting hyperglycemia    120 fasting  . Gastroesophageal reflux disease   . Gastroparesis   . Hemorrhoids   . Hyperlipidemia   . Hypertension    pt denies 05/30/13     Dr Kathe Mariner  . Hyponatremia   . Rheumatic heart disease    a. 07/2013 Echo: Ef 55-60%, Mod  AS/AI, Mod-Sev MS, sev dil LA, PASP 58;  b. 07/2013 TEE: EF 35-40%, mild-mod AS/AI, mod-sev MS, LA smoke;  c. 07/2013 Cath: elev R heart pressures, nl cors.  . Shortness of breath     Past Surgical History:  Procedure Laterality Date  . AORTIC VALVE REPLACEMENT N/A 07/18/2013   Procedure: AORTIC VALVE REPLACEMENT (AVR);  Surgeon: Gaye Pollack, MD;  Location: [facility];  Service: Open Heart Surgery;  Laterality: N/A;  . BACK SURGERY    . BREAST LUMPECTOMY Right 1995  . CARDIAC CATHETERIZATION    . CARDIOVERSION N/A 07/26/2013   Procedure: CARDIOVERSION;  Surgeon: Fay Records,  MD;  Location: [facility] ENDOSCOPY;  Service: Cardiovascular;  Laterality: N/A;  . COLONOSCOPY  2012   Negative screening procedure  . DILATION AND CURETTAGE OF UTERUS    . ESOPHAGEAL MANOMETRY N/A 06/17/2013   Procedure: ESOPHAGEAL MANOMETRY (EM);  Surgeon: Sable Feil, MD;  Location: [facility] ENDOSCOPY;  Service: Endoscopy;  Laterality: N/A;  . INTRAOPERATIVE TRANSESOPHAGEAL ECHOCARDIOGRAM N/A 07/18/2013   Procedure: INTRAOPERATIVE TRANSESOPHAGEAL ECHOCARDIOGRAM;  Surgeon: Gaye Pollack, MD;  Location: [facility] OR;  Service: Open Heart Surgery;  Laterality: N/A;  . LEFT AND RIGHT HEART CATHETERIZATION WITH CORONARY ANGIOGRAM N/A 07/10/2013   Procedure: LEFT AND RIGHT HEART CATHETERIZATION WITH CORONARY ANGIOGRAM;  Surgeon: Burnell Blanks, MD;  Location: [facility] CATH LAB;  Service: Cardiovascular;  Laterality: N/A;  . LUMBAR LAMINECTOMY/DECOMPRESSION MICRODISCECTOMY Right 05/13/2014   Procedure: LUMBAR LAMINECTOMY/DECOMPRESSION MICRODISCECTOMY 1 LEVEL  lumbar four/five;  Surgeon: Faythe Ghee, MD;  Location: [facility] NEURO ORS;  Service: Neurosurgery;  Laterality: Right;  . MITRAL VALVE REPLACEMENT N/A 07/18/2013   Procedure: MITRAL VALVE (MV) REPLACEMENT;  Surgeon: Gaye Pollack, MD;  Location: [facility] OR;  Service: Open Heart Surgery;  Laterality: N/A;  . MITRAL VALVE [place], closed mitral valvulotomy by finger fracture  . TEE WITHOUT CARDIOVERSION  01/24/2012   Procedure: TRANSESOPHAGEAL ECHOCARDIOGRAM (TEE);  Surgeon: Lelon Perla, MD;  Location: [facility] ENDOSCOPY;  Service: Cardiovascular;  Laterality: N/A;  . TEE WITHOUT CARDIOVERSION N/A 07/11/2013   Procedure: TRANSESOPHAGEAL ECHOCARDIOGRAM (TEE);  Surgeon: Thayer Headings, MD;  Location: [facility];  Service: Cardiovascular;  Laterality: N/A;  . TEE WITHOUT CARDIOVERSION N/A 07/26/2013   Procedure: TRANSESOPHAGEAL ECHOCARDIOGRAM (TEE);  Surgeon: Fay Records, MD;  Location: [facility] ENDOSCOPY;  Service: Cardiovascular;  Laterality: N/A;  .  TUBAL LIGATION  1973    Current Outpatient Medications  Medication Sig Dispense Refill  . albuterol (VENTOLIN HFA) 108 (90 BASE) MCG/ACT inhaler Inhale 2 puffs into the lungs every 4 (four) hours as needed for wheezing or shortness of breath.     Marland Kitchen atorvastatin (LIPITOR) 40 MG tablet TAKE ONE TABLET BY MOUTH ONCE DAILY. 90 tablet 1  . diltiazem (CARDIZEM CD) 180 MG 24 hr capsule Take 1 capsule (180 mg total) by mouth daily. 30 capsule 1  . escitalopram (LEXAPRO) 10 MG tablet Take 10 mg by mouth daily.     . famotidine (PEPCID) 20 MG tablet One after supper 30 tablet 11  . fexofenadine (ALLEGRA) 30 MG tablet Take 30 mg by mouth daily.     Marland Kitchen FLOVENT HFA 44 MCG/ACT inhaler Inhale 2 puffs into the lungs 2 (two) times daily.     . fluticasone (FLONASE) 50 MCG/ACT nasal spray Place 1 spray into both nostrils daily as needed for allergies.     . furosemide (LASIX) 20 MG tablet TAKE 1 TABLET DAILY AS NEEDED FOR SWELLING  90 tablet 1  . levothyroxine (SYNTHROID) 100 MCG tablet Take 100 mcg by mouth daily before breakfast.     . metFORMIN (GLUCOPHAGE) 500 MG tablet Take 1 tablet (500 mg total) by mouth 2 (two) times daily with a meal. 60 tablet 1  . metoprolol tartrate (LOPRESSOR) 25 MG tablet TAKE (1) TABLET BY MOUTH TWICE DAILY. (Patient taking differently: Take 25 mg by mouth 2 (two) times daily. ) 180 tablet 1  . Multiple Vitamin (MULITIVITAMIN WITH MINERALS) TABS Take 1 tablet by mouth daily.    . pantoprazole (PROTONIX) 40 MG tablet Take 1 tablet (40 mg total) by mouth daily. Take 30-60 min before first meal of the day 30 tablet 2  . potassium chloride SA (K-DUR,KLOR-CON) 20 MEQ tablet Take 20 mEq by mouth daily.    Marland Kitchen warfarin (COUMADIN) 2.5 MG tablet TAKE AS DIRECTED BY COUMADIN CLINIC. (Patient taking differently: Take 2.5 mg by mouth See admin instructions. 2.5mg  on all days, except take 3.75mg  on Sundays, Tuesdays, and Thursdays) 45 tablet 6   No current facility-administered medications for  this visit.   Allergies:  Amiodarone   Social History: The patient  reports that she has never smoked. She has never used smokeless tobacco. She reports that she does not drink alcohol and does not use drugs.   Family History: The patient's family history includes Asthma in her maternal grandfather; Cirrhosis in her father; Diabetes in her sister; Heart disease (age of onset: 19) in her brother; Hypothyroidism in her mother; Rheum arthritis in her maternal grandmother.   ROS:  Please see the history of present illness. Otherwise, complete review of systems is positive for none.  All other systems are reviewed and negative.   Physical Exam: VS:  BP (!) 142/78   Pulse 64   Ht 5\' 2"  (1.575 m)   Wt 194 lb (88 kg)   SpO2 93%   BMI 35.48 kg/m , BMI Body mass index is 35.48 kg/m.  Wt Readings from Last 3 Encounters:  06/25/20 194 lb (88 kg)  06/23/20 180 lb (81.6 kg)  05/12/20 180 lb (81.6 kg)    General: Patient appears comfortable at rest. Neck: Supple, no elevated JVP or carotid bruits, no thyromegaly. Lungs: Clear to auscultation, nonlabored breathing at rest. Cardiac: Irregularly irregular rate and rhythm, no S3 or significant systolic murmur, no pericardial rub. Extremities: 1+ pitting edema, distal pulses 2+. Skin: Warm and dry. Musculoskeletal: No kyphosis. Neuropsychiatric: Alert and oriented x3, affect grossly appropriate.  ECG:  EKG 04/30/2020 sinus or ectopic atrial tachycardia rate of 108, borderline PR interval prolonged, incomplete RBBB  Recent Labwork: 04/01/2020: B Natriuretic Peptide 134.0 04/30/2020: ALT 22; AST 25; Hemoglobin 12.1; Magnesium 1.8; Platelets 204; TSH 0.977 05/01/2020: BUN 15; Creatinine, Ser 1.20; Potassium 4.7; Sodium 138     Component Value Date/Time   CHOL 182 01/21/2012 2147   TRIG 147 01/21/2012 2147   HDL 52 01/21/2012 2147   CHOLHDL 3.5 01/21/2012 2147   VLDL 29 01/21/2012 2147   LDLCALC 101 (H) 01/21/2012 2147    Other Studies Reviewed  Today:   Diagnostic Studies 07/2013 TTE Study Conclusions  - Study data: Technically difficult study. - Left ventricle: The cavity size was normal. Wall thickness was normal. Systolic function was normal. The estimated ejection fraction was in the range of 55% to 60%. Indeterminate diastolic function. There is evidence of very high left atrial pressures (E/e' 63) - Aortic valve: Trileaflet; mildly thickened leaflets. Accurate evaluation of aortic stenosis limited in the  setting of severe mitral stenosis and decreased ventricular loadin. Morphologically there does not appear to be significant stenosis. By gradient (mean PG 21 mmHg) the stenosis is moderate, by AVA VTI (0.8) stenosis is severe. Moderate regurgitation, AR PHT is 439 cm/s. Valve area: 0.78cm^2(VTI). Valve area: 0.78cm^2 (Vmax). - Mitral valve: The findings are consistent with moderate to severe stenosis. (Mean PG 12 mmHg, MVA by PHT 1.3. Cannot use MVA by VTI due to moderate AI. Valve area by pressure half-time: 1.38cm^2. - Left atrium: The atrium was severely dilated. - Right ventricle: Systolic function was mildly reduced, RV TAPSE is 1.4, TV systolic annular tissue velocity 10 cm/s. - Pulmonary arteries: Systolic pressure was moderately increased. PA peak pressure: 30mm Hg (S) (PASP based on a fairly faint spectral Doppler waveform)   07/2013 TEE Study Conclusions  - Left ventricle: Systolic function was moderately reduced. The estimated ejection fraction was in the range of 35% to 40%. - Aortic valve: There was mild to moderate stenosis. Mild to moderate regurgitation. - Mitral valve: The findings are consistent with moderate to severe stenosis. Valve area by pressure half-time: 1.45cm^2. - Left atrium: There was spontaneous echo contrast ("smoke"). - Atrial septum: No defect or patent foramen ovale was identified. - Tricuspid valve: No evidence of  vegetation. Transesophageal echocardiography. 2D and color Doppler. Height: Height: 157.5cm. Height: 62in. Weight: Weight: 75kg. Weight: 165lb. Body mass index: BMI: 30.2kg/m^2. Body surface area:  BSA: 1.50m^2. Blood pressure: 143/56. Patient status: Inpatient. Location: Endoscopy.   06/2018 echo Study Conclusions  - Left ventricle: The cavity size was normal. Wall thickness was normal. Systolic function was normal. The estimated ejection fraction was in the range of 60% to 65%. - Aortic valve: AV prosthesis is difficult to see Peak and mean gradients through the valve are 18 and 10 mm Hg respectively. - Mitral valve: Peak and mean gradients through the valve prosthesis are 19 and 4 mm Hg respectively MVA by P T1/2 is 3 cm2. s/p MV prosthesis. Valve prosthesis is difficult to see. - Pulmonary arteries: PA peak pressure: 48 mm Hg (S).   04/2020 echo IMPRESSIONS  1. Left ventricular ejection fraction, by estimation, is 60 to 65%. The  left ventricle has normal function. The left ventricle has no regional  wall motion abnormalities. Left ventricular diastolic parameters are  indeterminate.  2. Right ventricular systolic function is low normal. The right  ventricular size is normal. There is moderately elevated pulmonary artery  systolic pressure. The estimated right ventricular systolic pressure is  01.0 mmHg.  3. Left atrial size was mildly dilated.  4. The mitral valve has been repaired/replaced. No evidence of mitral  valve regurgitation. The mean mitral valve gradient is 5.0 mmHg. There is  a 25 mm Magna Ease bioprosthetic valve present in the mitral position.  Grossly normal function.  5. The aortic valve has been repaired/replaced. Aortic valve  regurgitation is not visualized. There is a 21 mm Magna Ease bioprosthetic  valve present in the aortic position. Aortic valve mean gradient measures  7.0 mmHg. Grossly normal function.  6. The  inferior vena cava is normal in size with greater than 50%  respiratory variability, suggesting right atrial pressure of 3 mmHg.   Assessment and Plan:  1. Syncope and collapse   2. Bilateral lower extremity edema    1. Syncope and collapse Denies any further episodes of syncope or collapse.  Denies any lightheadedness, dizziness, presyncopal or syncopal episodes.  States she has stopped going out into the garden  on hot days by herself.  Denies any palpitations or arrhythmias although she does have atrial fibrillation.  2.  Lower extremity edema Husband who is with patient today states since Lasix was decreased to 20 mg as needed her swelling has increased in both lower extremities.  States she has been taking 20 of Lasix daily 5 days out of 7.  Advised to start Lasix on a schedule of 20 mg daily and may take an extra 20 mg as needed for increased lower extremity edema or shortness of breath.  Medication Adjustments/Labs and Tests Ordered: Current medicines are reviewed at length with the patient today.  Concerns regarding medicines are outlined above.   Disposition: Follow-up with Dr. Harl Bowie or APP 6 months  Signed, Levell July, NP 06/25/2020 9:45 AM    [facility] [address], [place], [place] [address] Phone: [phone]; Fax: [phone]

## 2020-06-25 ENCOUNTER — Ambulatory Visit (INDEPENDENT_AMBULATORY_CARE_PROVIDER_SITE_OTHER): Payer: Medicare PPO | Admitting: *Deleted

## 2020-06-25 ENCOUNTER — Encounter: Payer: Self-pay | Admitting: Family Medicine

## 2020-06-25 ENCOUNTER — Ambulatory Visit: Payer: Medicare PPO | Admitting: Family Medicine

## 2020-06-25 VITALS — BP 142/78 | HR 64 | Ht 62.0 in | Wt 194.0 lb

## 2020-06-25 DIAGNOSIS — Z5181 Encounter for therapeutic drug level monitoring: Secondary | ICD-10-CM | POA: Diagnosis not present

## 2020-06-25 DIAGNOSIS — I639 Cerebral infarction, unspecified: Secondary | ICD-10-CM | POA: Diagnosis not present

## 2020-06-25 DIAGNOSIS — R55 Syncope and collapse: Secondary | ICD-10-CM | POA: Diagnosis not present

## 2020-06-25 DIAGNOSIS — R600 Localized edema: Secondary | ICD-10-CM

## 2020-06-25 DIAGNOSIS — I4891 Unspecified atrial fibrillation: Secondary | ICD-10-CM

## 2020-06-25 DIAGNOSIS — Z952 Presence of prosthetic heart valve: Secondary | ICD-10-CM

## 2020-06-25 DIAGNOSIS — E1129 Type 2 diabetes mellitus with other diabetic kidney complication: Secondary | ICD-10-CM | POA: Diagnosis not present

## 2020-06-25 LAB — POCT INR: INR: 6.2 — AB (ref 2.0–3.0)

## 2020-06-25 MED ORDER — FUROSEMIDE 20 MG PO TABS: 20.0000 mg | ORAL_TABLET | Freq: Every day | ORAL | Status: DC

## 2020-06-25 NOTE — Patient Instructions (Signed)
Hold warfarin x 3 days then resume 1 1/2 tablets daily Recheck in 1 wk Starting Protonix in am and Pepcid in pm for reflux

## 2020-06-25 NOTE — Patient Instructions (Signed)
Medication Instructions:   Change Lasix back to daily, may take an extra dose as needed for edema or shortness of breath.  Continue all other medications.    Labwork: none  Testing/Procedures: none  Follow-Up: 6 months   Any Other Special Instructions Will Be Listed Below (If Applicable).  If you need a refill on your cardiac medications before your next appointment, please call your pharmacy.

## 2020-07-03 ENCOUNTER — Ambulatory Visit (INDEPENDENT_AMBULATORY_CARE_PROVIDER_SITE_OTHER): Payer: Medicare PPO | Admitting: *Deleted

## 2020-07-03 DIAGNOSIS — I639 Cerebral infarction, unspecified: Secondary | ICD-10-CM

## 2020-07-03 DIAGNOSIS — Z952 Presence of prosthetic heart valve: Secondary | ICD-10-CM | POA: Diagnosis not present

## 2020-07-03 DIAGNOSIS — Z5181 Encounter for therapeutic drug level monitoring: Secondary | ICD-10-CM | POA: Diagnosis not present

## 2020-07-03 DIAGNOSIS — I4891 Unspecified atrial fibrillation: Secondary | ICD-10-CM | POA: Diagnosis not present

## 2020-07-03 LAB — POCT INR: INR: 2.8 (ref 2.0–3.0)

## 2020-07-03 NOTE — Patient Instructions (Signed)
Continue warfarin 1 1/2 tablets daily Recheck in 2 wk On Protonix in am and Pepcid in pm for reflux

## 2020-07-09 DIAGNOSIS — Z23 Encounter for immunization: Secondary | ICD-10-CM | POA: Diagnosis not present

## 2020-07-15 DIAGNOSIS — I480 Paroxysmal atrial fibrillation: Secondary | ICD-10-CM | POA: Diagnosis not present

## 2020-07-15 DIAGNOSIS — E1129 Type 2 diabetes mellitus with other diabetic kidney complication: Secondary | ICD-10-CM | POA: Diagnosis not present

## 2020-07-15 DIAGNOSIS — I5032 Chronic diastolic (congestive) heart failure: Secondary | ICD-10-CM | POA: Diagnosis not present

## 2020-07-15 DIAGNOSIS — Z79899 Other long term (current) drug therapy: Secondary | ICD-10-CM | POA: Diagnosis not present

## 2020-07-20 ENCOUNTER — Ambulatory Visit (INDEPENDENT_AMBULATORY_CARE_PROVIDER_SITE_OTHER): Payer: Medicare PPO | Admitting: *Deleted

## 2020-07-20 DIAGNOSIS — I4891 Unspecified atrial fibrillation: Secondary | ICD-10-CM | POA: Diagnosis not present

## 2020-07-20 DIAGNOSIS — I639 Cerebral infarction, unspecified: Secondary | ICD-10-CM

## 2020-07-20 DIAGNOSIS — Z5181 Encounter for therapeutic drug level monitoring: Secondary | ICD-10-CM

## 2020-07-20 LAB — POCT INR: INR: 5.3 — AB (ref 2.0–3.0)

## 2020-07-20 NOTE — Patient Instructions (Addendum)
Hold warfarin tonight and tomorrow night then decrease dose to 1 1/2 tablets daily except 1 tablet on Mondays and Thursdays Recheck in 1 wk On Protonix in am and Pepcid in pm for reflux Bleeding and fall precautions discussed with pt and she verbalized understanding

## 2020-07-24 DIAGNOSIS — E1129 Type 2 diabetes mellitus with other diabetic kidney complication: Secondary | ICD-10-CM | POA: Diagnosis not present

## 2020-07-24 DIAGNOSIS — I480 Paroxysmal atrial fibrillation: Secondary | ICD-10-CM | POA: Diagnosis not present

## 2020-07-24 DIAGNOSIS — R0982 Postnasal drip: Secondary | ICD-10-CM | POA: Diagnosis not present

## 2020-07-24 DIAGNOSIS — N183 Chronic kidney disease, stage 3 unspecified: Secondary | ICD-10-CM | POA: Diagnosis not present

## 2020-07-27 ENCOUNTER — Ambulatory Visit (INDEPENDENT_AMBULATORY_CARE_PROVIDER_SITE_OTHER): Payer: Medicare PPO | Admitting: *Deleted

## 2020-07-27 DIAGNOSIS — I639 Cerebral infarction, unspecified: Secondary | ICD-10-CM

## 2020-07-27 DIAGNOSIS — Z5181 Encounter for therapeutic drug level monitoring: Secondary | ICD-10-CM | POA: Diagnosis not present

## 2020-07-27 DIAGNOSIS — I4891 Unspecified atrial fibrillation: Secondary | ICD-10-CM

## 2020-07-27 DIAGNOSIS — Z952 Presence of prosthetic heart valve: Secondary | ICD-10-CM

## 2020-07-27 LAB — POCT INR: INR: 2.7 (ref 2.0–3.0)

## 2020-07-27 NOTE — Patient Instructions (Signed)
Continue warfarin 1 1/2 tablets daily except 1 tablet on Mondays and Thursdays Recheck in 3 wks On Protonix in am and Pepcid in pm for reflux

## 2020-08-05 ENCOUNTER — Other Ambulatory Visit: Payer: Self-pay

## 2020-08-05 ENCOUNTER — Ambulatory Visit: Payer: Medicare PPO | Admitting: Internal Medicine

## 2020-08-05 ENCOUNTER — Encounter: Payer: Self-pay | Admitting: Internal Medicine

## 2020-08-05 DIAGNOSIS — R058 Other specified cough: Secondary | ICD-10-CM

## 2020-08-05 MED ORDER — METHYLPREDNISOLONE ACETATE 80 MG/ML IJ SUSP
120.0000 mg | Freq: Once | INTRAMUSCULAR | Status: AC
  Administered 2020-08-05: 120 mg via INTRAMUSCULAR

## 2020-08-05 MED ORDER — BUDESONIDE-FORMOTEROL FUMARATE 80-4.5 MCG/ACT IN AERO
INHALATION_SPRAY | RESPIRATORY_TRACT | 11 refills | Status: DC
Start: 2020-08-05 — End: 2021-01-14

## 2020-08-05 NOTE — Assessment & Plan Note (Signed)
Original onset 2010 on ACEi  Spiro 09/2012: normal  Sinus ct 2014:  No chronic sinus disease. 10/16/12 Meth challenge  + for hyperreactive airways disease - 06/23/2020  After extensive coaching inhaler device,  effectiveness =    75% from a baseline of 25% > continue flovent 44 2bid and prn saba plus max gerd rx  08/05/2020  After extensive coaching inhaler device,  effectiveness =   50% from a baseline of 25% > changed to symb 80 2bid and given depomedrol 120 mg IM    No better on max gerd with hx suggestive of asthma and exam more c/w uacs in that she coughs on inspiration but clearly failing flovent and needing saba once daily with noct wakening most nights  Rec; As above F/u in 6 weeks with all meds in hand using a trust but verify approach to confirm accurate Medication  Reconciliation The principal here is that until we are certain that the  patients are doing what we've asked, it makes no sense to ask them to do more.          Each maintenance medication was reviewed in detail including emphasizing most importantly the difference between maintenance and prns and under what circumstances the prns are to be triggered using an action plan format where appropriate.  Total time for H and P, chart review, counseling, teaching device and generating customized AVS unique to this office visit / charting = 30 min

## 2020-08-05 NOTE — Patient Instructions (Signed)
Depomedrol 120 mg IM   Plan A = Automatic = Always=    symbicort 80 Take 2 puffs first thing in am and then another 2 puffs about 12 hours later.   Work on inhaler technique:  relax and gently blow all the way out then take a nice smooth deep breath back in, triggering the inhaler at same time you start breathing in.  Hold for up to 5 seconds if you can. Blow out thru nose. Rinse and gargle with water when done      Plan B = Backup (to supplement plan A, not to replace it) Only use your albuterol inhaler as a rescue medication to be used if you can't catch your breath by resting or doing a relaxed purse lip breathing pattern.  - The less you use it, the better it will work when you need it. - Ok to use the inhaler up to 2 puffs  every 4 hours if you must but call for appointment if use goes up over your usual need - Don't leave home without it !!  (think of it like the spare tire for your car)     Please schedule a follow up office visit in 6 weeks, call sooner if needed  - bring inhalers and the empty cannister of symbicort

## 2020-08-05 NOTE — Progress Notes (Signed)
[name], female    DOB: 1946-03-08,     MRN: [id number]   Brief patient profile:  74 yowf never smoker with a h/o sob related to valvular heart dz    Seen prior to Epic in [place]: Feb 20, 2009 initial pulmonary eval started aerobics 3 months prior to OV  X 30 min weekly and tolerated well with improving ex tol until 3 weeks prior to OV   noted decrease tolerance and since then gradually worse to point where walking to mailbox slt uphill or climbing steps has to stop at top.  In retrospect coughing for 3 months dry, worse immediately on lying down. due to the cough lisnopril was stopped and could not tolerate the substitute and cough hasn't changed but breathing got worse. prednisone no help so far and proaire no benefit. rec GERD diet   continue prilosec and reglan stop prednisone and proaire  avoid salt  maxzide 25 one each am and Dr [name] office   10/16/12 Meth challenge  + for hyperreactive airways disease  clance eval 05/14/13 [name] that at this point is clearly upper airway in origin.  [name], but her current cough failed to respond to prednisone tapers.  Therefore it is unlikely that it is related to cough variant asthma.  She does not have chronic sinusitis by her CT scan, and there is nothing on chest x-ray earlier in the year to explain her cough.  She does have a history of breast cancer, but there has been no evidence for recurrence.  [name], but this has an extremely low yield.  [name], and therefore I am concerned she may be having laryngopharyngeal reflux as a cause for her cough.  I also think she may have a cyclical mechanism as well she did have some response to tramadol, and we could consider a trial of gabapentin  AVR  07/18/13   04/2020 echo 1. Left ventricular ejection fraction, by estimation, is 60 to 65%. The  left  ventricle has normal function. The left ventricle has no regional  wall motion abnormalities. Left ventricular diastolic parameters are  indeterminate.  2. Right ventricular systolic function is low normal. The right  ventricular size is normal. There is moderately elevated pulmonary artery  systolic pressure. The estimated right ventricular systolic pressure is  46.9 mmHg.  3. Left atrial size was mildly dilated.  4. The mitral valve has been repaired/replaced. No evidence of mitral  valve regurgitation. The mean mitral valve gradient is 5.0 mmHg. There is  a 25 mm Magna Ease bioprosthetic valve present in the mitral position.  Grossly normal function.  5. The aortic valve has been repaired/replaced. Aortic valve  regurgitation is not visualized. There is a 21 mm Magna Ease bioprosthetic  valve present in the aortic position. Aortic valve mean gradient measures  7.0 mmHg. Grossly normal function.  6. The inferior vena cava is normal in size with greater than 50%  respiratory variability, suggesting right atrial pressure of 3 mmHg.       History of Present Illness  06/23/2020  Pulmonary/ 1st office eval/ [name] / [place] [facility] / re establish re cough and sob on flovent 44 2bid  For cough variant asthma  Chief Complaint  Patient presents with  . Consult    Patient has a dry cough that has been going on for  many years but worse over the last 6 months. Shortness of breath with exertion.   Dyspnea:  Walks dog x 30 min / mostly flat uses cane to balance/ steps ok slowly due to breathing problems x sev years prior to OV   Cough: worse in afternoons and sometimes hs has to sit up 30 degrees to prevent cough supine  Sleep: ok as long as head is up 30 degrees new x 2 years  SABA use: sev times a day seems to help at 4 pm rec GERD diet  Pantoprazole (protonix) 40 mg   Take  30-60 min before first meal of the day and Pepcid (famotidine)  20 mg one after supper until return to office -  this is the best way to tell whether stomach acid is contributing to your problem.   Plan A = Automatic = Always=    flovent Take 2 puffs first thing in am and then another 2 puffs about 12 hours later.  Work on inhaler technique:  Plan B = Backup (to supplement plan A, not to replace it) Only use your albuterol inhaler as a rescue medication   Please schedule a follow up office visit in 6 weeks, call sooner if needed - bring inhalers  Next step ? Gabapentin?   08/05/2020  f/u ov/[place] office/[name] re: cough > sob x years  Chief Complaint  Patient presents with  . Follow-up    non productive cough   Dyspnea:  Not limited by breathing from desired activities  / plays golf/ walks dog  Cough: dry ? Worse with activity (husband says not reproducible ?  worse p supper  Sleeping: most nights waking up  SABA use: once a day  02: none    No obvious other patterns in  day to day or daytime variability or assoc excess/ purulent sputum or mucus plugs or hemoptysis or cp or chest tightness, subjective wheeze or overt sinus or hb symptoms.     Also denies any obvious fluctuation of symptoms with weather or environmental changes or other aggravating or alleviating factors except as outlined above   No unusual exposure hx or h/o childhood pna/ asthma or knowledge of premature birth.  Current Allergies, Complete Past Medical History, Past Surgical History, Family History, and Social History were reviewed in [employer] record.  ROS  The following are not active complaints unless bolded Hoarseness, sore throat, dysphagia, dental problems, itching, sneezing,  nasal congestion or discharge of excess mucus or purulent secretions, ear ache,   fever, chills, sweats, unintended wt loss or wt gain, classically pleuritic or exertional cp,  orthopnea pnd or arm/hand swelling  or leg swelling, presyncope, palpitations, abdominal pain, anorexia, nausea, vomiting, diarrhea  or change in  bowel habits or change in bladder habits, change in stools or change in urine, dysuria, hematuria,  rash, arthralgias, visual complaints, headache, numbness, weakness or ataxia or problems with walking or coordination,  change in mood or  memory.        Current Meds  Medication Sig  . albuterol (VENTOLIN HFA) 108 (90 BASE) MCG/ACT inhaler Inhale 2 puffs into the lungs every 4 (four) hours as needed for wheezing or shortness of breath.   Marland Kitchen atorvastatin (LIPITOR) 40 MG tablet TAKE ONE TABLET BY MOUTH ONCE DAILY.  Marland Kitchen diltiazem (CARDIZEM CD) 180 MG 24 hr capsule Take 1 capsule (180 mg total) by mouth daily.  Marland Kitchen escitalopram (LEXAPRO) 10 MG tablet Take 10 mg by mouth daily.   . famotidine (  PEPCID) 20 MG tablet One after supper  . fexofenadine (ALLEGRA) 30 MG tablet Take 30 mg by mouth daily.   Marland Kitchen FLOVENT HFA 44 MCG/ACT inhaler Inhale 2 puffs into the lungs 2 (two) times daily.   . fluticasone (FLONASE) 50 MCG/ACT nasal spray Place 1 spray into both nostrils daily as needed for allergies.   . furosemide (LASIX) 20 MG tablet Take 1 tablet (20 mg total) by mouth daily. (may take an extra tab as needed for swelling or shortness of breath)  . levothyroxine (SYNTHROID) 100 MCG tablet Take 100 mcg by mouth daily before breakfast.   . metFORMIN (GLUCOPHAGE) 500 MG tablet Take 1 tablet (500 mg total) by mouth 2 (two) times daily with a meal.  . metoprolol tartrate (LOPRESSOR) 25 MG tablet TAKE (1) TABLET BY MOUTH TWICE DAILY. (Patient taking differently: Take 25 mg by mouth 2 (two) times daily. )  . Multiple Vitamin (MULITIVITAMIN WITH MINERALS) TABS Take 1 tablet by mouth daily.  . pantoprazole (PROTONIX) 40 MG tablet Take 1 tablet (40 mg total) by mouth daily. Take 30-60 min before first meal of the day  . potassium chloride SA (K-DUR,KLOR-CON) 20 MEQ tablet Take 20 mEq by mouth daily.  Marland Kitchen warfarin (COUMADIN) 2.5 MG tablet TAKE AS DIRECTED BY COUMADIN CLINIC. (Patient taking differently: Take 2.5 mg by mouth See  admin instructions. 2.5mg  on all days, except take 3.75mg  on Sundays, Tuesdays, and Thursdays)                  Past Medical History:  Diagnosis Date  . Anemia   . Atrial fibrillation ([place])   . Breast carcinoma ([place]) 1995   1995  . Cardioembolic stroke ([place]) 10/6107   Right frontal in 01/2012; normal carotid ultrasound; possible LAA thrombus by TEE; virtual complete neurologic recovery  . Chronic kidney disease, stage 2, mildly decreased GFR    GFR of approximately 60  . Depression   . Diverticulosis of colon (without mention of hemorrhage) 2012   Dr. Laural Golden  . Fasting hyperglycemia    120 fasting  . Gastroesophageal reflux disease   . Gastroparesis   . Hemorrhoids   . Hyperlipidemia   . Hypertension    pt denies 05/30/13     Dr Kathe Mariner  . Hyponatremia   . Rheumatic heart disease    a. 07/2013 Echo: Ef 55-60%, Mod AS/AI, Mod-Sev MS, sev dil LA, PASP 58;  b. 07/2013 TEE: EF 35-40%, mild-mod AS/AI, mod-sev MS, LA smoke;  c. 07/2013 Cath: elev R heart pressures, nl cors.  . Shortness of breath     Past Medical History:    Hx of NEOPLASM, MALIGNANT, BREAST (ICD-174.9)    HYPERLIPIDEMIA (ICD-272.4)    ASTHMA (ICD-493.90)    GERD with gastroparesis  Past Surgical History:    Breast surgery 1995    Heart valve surgery 1962 age 22 [facility] Mitral valve     Tubal ligation-1973   Objective:     Wt Readings from Last 3 Encounters:  08/05/20 178 lb 12.8 oz (81.1 kg)  06/25/20 194 lb (88 kg)  06/23/20 180 lb (81.6 kg)     Vital signs reviewed - Note on arrival 08/05/2020  02 sats  97% on RA    HEENT : pt wearing mask not removed for exam due to covid -19 concerns.    NECK :  without JVD/Nodes/TM/ nl carotid upstrokes bilaterally   LUNGS: no acc muscle use,  Nl contour chest which is clear to A  and P bilaterally with cough on insp  maneuvers    CV:  RRR  no s3 or murmur or increase in P2, and no edema   ABD:  soft and nontender with nl inspiratory excursion  in the supine position. No bruits or organomegaly appreciated, bowel sounds nl  MS:  Slow moving  ext warm without deformities, calf tenderness, cyanosis or clubbing No obvious joint restrictions   SKIN: warm and dry without lesions    NEURO:  alert,  Answers almost all questions yes and no with husband shaking his head  with  no motor or cerebellar deficits apparent.               Assessment

## 2020-08-17 ENCOUNTER — Ambulatory Visit (INDEPENDENT_AMBULATORY_CARE_PROVIDER_SITE_OTHER): Payer: Medicare PPO | Admitting: *Deleted

## 2020-08-17 DIAGNOSIS — Z952 Presence of prosthetic heart valve: Secondary | ICD-10-CM | POA: Diagnosis not present

## 2020-08-17 DIAGNOSIS — I4891 Unspecified atrial fibrillation: Secondary | ICD-10-CM | POA: Diagnosis not present

## 2020-08-17 DIAGNOSIS — Z5181 Encounter for therapeutic drug level monitoring: Secondary | ICD-10-CM | POA: Diagnosis not present

## 2020-08-17 DIAGNOSIS — I639 Cerebral infarction, unspecified: Secondary | ICD-10-CM

## 2020-08-17 LAB — POCT INR: INR: 2.5 (ref 2.0–3.0)

## 2020-08-17 NOTE — Patient Instructions (Signed)
Take warfarin 1 1/2 tablets tonight then resume 1 1/2 tablets daily except 1 tablet on Mondays and Thursdays Recheck in 4 wks On Protonix in am and Pepcid in pm for reflux

## 2020-08-26 DIAGNOSIS — E113293 Type 2 diabetes mellitus with mild nonproliferative diabetic retinopathy without macular edema, bilateral: Secondary | ICD-10-CM | POA: Diagnosis not present

## 2020-08-29 ENCOUNTER — Other Ambulatory Visit: Payer: Self-pay | Admitting: Cardiology

## 2020-09-09 ENCOUNTER — Other Ambulatory Visit: Payer: Self-pay | Admitting: Cardiology

## 2020-09-09 ENCOUNTER — Other Ambulatory Visit: Payer: Self-pay | Admitting: Internal Medicine

## 2020-09-09 DIAGNOSIS — R0600 Dyspnea, unspecified: Secondary | ICD-10-CM

## 2020-09-09 DIAGNOSIS — R0609 Other forms of dyspnea: Secondary | ICD-10-CM

## 2020-09-14 ENCOUNTER — Other Ambulatory Visit: Payer: Self-pay

## 2020-09-14 ENCOUNTER — Ambulatory Visit (INDEPENDENT_AMBULATORY_CARE_PROVIDER_SITE_OTHER): Payer: Medicare PPO | Admitting: *Deleted

## 2020-09-14 DIAGNOSIS — I4891 Unspecified atrial fibrillation: Secondary | ICD-10-CM | POA: Diagnosis not present

## 2020-09-14 DIAGNOSIS — Z5181 Encounter for therapeutic drug level monitoring: Secondary | ICD-10-CM | POA: Diagnosis not present

## 2020-09-14 DIAGNOSIS — I639 Cerebral infarction, unspecified: Secondary | ICD-10-CM | POA: Diagnosis not present

## 2020-09-14 DIAGNOSIS — Z952 Presence of prosthetic heart valve: Secondary | ICD-10-CM

## 2020-09-14 LAB — POCT INR: INR: 5.1 — AB (ref 2.0–3.0)

## 2020-09-14 NOTE — Patient Instructions (Addendum)
Hold warfarin x 2 days then resume 1 1/2 tablets daily except 1 tablet on Mondays and Thursdays Recheck in 1 wk On Protonix in am and Pepcid in pm for reflux Bleeding and fall precautions discussed with pt and she verbalized understanding

## 2020-09-16 ENCOUNTER — Other Ambulatory Visit: Payer: Self-pay | Admitting: Internal Medicine

## 2020-09-16 ENCOUNTER — Ambulatory Visit (HOSPITAL_COMMUNITY)
Admission: RE | Admit: 2020-09-16 | Discharge: 2020-09-16 | Disposition: A | Discharge status: Home / Self Care | Payer: Medicare PPO | Source: Ambulatory Visit | Attending: Internal Medicine | Admitting: Internal Medicine

## 2020-09-16 ENCOUNTER — Telehealth: Payer: Self-pay | Admitting: Internal Medicine

## 2020-09-16 ENCOUNTER — Ambulatory Visit: Payer: Medicare PPO | Admitting: Internal Medicine

## 2020-09-16 ENCOUNTER — Other Ambulatory Visit: Payer: Self-pay

## 2020-09-16 ENCOUNTER — Encounter: Payer: Self-pay | Admitting: Internal Medicine

## 2020-09-16 DIAGNOSIS — R058 Other specified cough: Secondary | ICD-10-CM

## 2020-09-16 DIAGNOSIS — I10 Essential (primary) hypertension: Secondary | ICD-10-CM

## 2020-09-16 DIAGNOSIS — R0609 Other forms of dyspnea: Secondary | ICD-10-CM

## 2020-09-16 DIAGNOSIS — J9819 Other pulmonary collapse: Secondary | ICD-10-CM | POA: Diagnosis not present

## 2020-09-16 DIAGNOSIS — R059 Cough, unspecified: Secondary | ICD-10-CM | POA: Diagnosis not present

## 2020-09-16 MED ORDER — GABAPENTIN 100 MG PO CAPS: 100.0000 mg | ORAL_CAPSULE | Freq: Four times a day (QID) | ORAL | 2 refills | Status: DC

## 2020-09-16 NOTE — Progress Notes (Signed)
Spoke with pt and notified of results per Dr. Melvyn Novas. Pt verbalized understanding and denied any questions. Pt already scheduled ov with MW 10/29/20 and I have ordered the cxr to be done prior.

## 2020-09-16 NOTE — Patient Instructions (Addendum)
Work on inhaler technique:  relax and gently blow all the way out then take a nice smooth deep breath back in, triggering the inhaler at same time you start breathing in.  Hold for up to 5 seconds if you can. Blow out thru nose. Rinse and gargle with water when done  - remember to take practice breaths and reversal whistle     Gabapentin 100 mg twice a day for a week then 4 x daily   Please remember to go to the  x-ray department  @  [facility] for your tests - we will call you with the results when they are available     Please schedule a follow up office visit in 6 weeks, call sooner if needed  Add Needs f/u cxr  ? rml syndrome p rx mucinex dm > then ct chest/ sinus if not better  and check allergy profile on return

## 2020-09-16 NOTE — Progress Notes (Signed)
[name], female    DOB: 1946/04/23      MRN: [id number]   Brief patient profile:  74 yowf never smoker with a h/o sob related to valvular heart dz    Seen prior to Epic in [place]: Feb 20, 2009 initial pulmonary eval started aerobics 3 months prior to OV  X 30 min weekly and tolerated well with improving ex tol until 3 weeks prior to OV   noted decrease tolerance and since then gradually worse to point where walking to mailbox slt uphill or climbing steps has to stop at top.  In retrospect coughing for 3 months dry, worse immediately on lying down. due to the cough lisnopril was stopped and could not tolerate the substitute and cough hasn't changed but breathing got worse. prednisone no help so far and proaire no benefit. rec GERD diet   continue prilosec and reglan stop prednisone and proaire  avoid salt  maxzide 25 one each am and Dr [name] [name]   10/16/12 Meth challenge  + for hyperreactive airways disease  clance eval 05/14/13 [name] that at this point is clearly upper airway in origin.  [name], but her current cough failed to respond to prednisone tapers.  Therefore it is unlikely that it is related to cough variant asthma.  She does not have chronic sinusitis by her CT scan, and there is nothing on chest x-ray earlier in the year to explain her cough.  She does have a history of breast cancer, but there has been no evidence for recurrence.  [name], but this has an extremely low yield.  [name], and therefore I am concerned she may be having laryngopharyngeal reflux as a cause for her cough.  I also think she may have a cyclical mechanism as well she did have some response to tramadol, and we could consider a trial of gabapentin  AVR  07/18/13   04/2020 echo 1. Left ventricular ejection fraction, by estimation, is 60 to 65%. The  left  ventricle has normal function. The left ventricle has no regional  wall motion abnormalities. Left ventricular diastolic parameters are  indeterminate.  2. Right ventricular systolic function is low normal. The right  ventricular size is normal. There is moderately elevated pulmonary artery  systolic pressure. The estimated right ventricular systolic pressure is  23.5 mmHg.  3. Left atrial size was mildly dilated.  4. The mitral valve has been repaired/replaced. No evidence of mitral  valve regurgitation. The mean mitral valve gradient is 5.0 mmHg. There is  a 25 mm Magna Ease bioprosthetic valve present in the mitral position.  Grossly normal function.  5. The aortic valve has been repaired/replaced. Aortic valve  regurgitation is not visualized. There is a 21 mm Magna Ease bioprosthetic  valve present in the aortic position. Aortic valve mean gradient measures  7.0 mmHg. Grossly normal function.  6. The inferior vena cava is normal in size with greater than 50%  respiratory variability, suggesting right atrial pressure of 3 mmHg.       History of Present Illness  06/23/2020  Pulmonary/ 1st [name] eval/ [name] /  [name] / re establish re cough and sob on flovent 44 2bid  For cough variant asthma  Chief Complaint  Patient presents with  . Consult    Patient has a dry cough that has been going on  for many years but worse over the last 6 months. Shortness of breath with exertion.   Dyspnea:  Walks dog x 30 min / mostly flat uses cane to balance/ steps ok slowly due to breathing problems x sev years prior to OV   Cough: worse in afternoons and sometimes hs has to sit up 30 degrees to prevent cough supine  Sleep: ok as long as head is up 30 degrees new x 2 years  SABA use: sev times a day seems to help at 4 pm rec GERD diet  Pantoprazole (protonix) 40 mg   Take  30-60 min before first meal of the day and Pepcid (famotidine)  20 mg one after supper until return to [name] -  this is the best way to tell whether stomach acid is contributing to your problem.   Plan A = Automatic = Always=    flovent Take 2 puffs first thing in am and then another 2 puffs about 12 hours later.  Work on inhaler technique:  Plan B = Backup (to supplement plan A, not to replace it) Only use your albuterol inhaler as a rescue medication   Please schedule a follow up [name] visit in 6 weeks, call sooner if needed - bring inhalers  Next step ? Gabapentin?   08/05/2020  f/u ov/[name]/[name] re: cough > sob x years  Chief Complaint  Patient presents with  . Follow-up    non productive cough   Dyspnea:  Not limited by breathing from desired activities  / plays golf/ walks dog  Cough: dry ? Worse with activity (husband says not reproducible ?  worse p supper  Sleeping: most nights waking up  SABA use: once a day  02: none  rec Depomedrol 120 mg IM  Plan A = Automatic = Always=    symbicort 80 Take 2 puffs first thing in am and then another 2 puffs about 12 hours later.  Work on inhaler technique:     Plan B = Backup (to supplement plan A, not to replace it) Only use your albuterol inhaler as a rescue medication   Please schedule a follow up [name] visit in 6 weeks, call sooner if needed  - bring inhalers and the empty cannister of symbicort    09/16/2020  f/u ov/[name]/[name] re: cough  x years ? Decades "nothing has ever helped" no better p depo last ov, not ever transiently  Chief Complaint  Patient presents with  . Follow-up    Cough is unchanged since the last visit- prod with white sputum.  She is using her albuterol inhaler once per day usually around 4-5 pm.    Dyspnea:  Walks dog, uses cane / some balance issues limiting more than sob and still plays golf Cough: worse  p supper despite pepcid Sleeping: does better in recliner 30 degrees  SABA use: once a day around 5pm seems to help though note hfa very poor baseline  02: none    No obvious day to day  or daytime variability or assoc excess/ purulent sputum or mucus plugs or hemoptysis or cp or chest tightness, subjective wheeze or overt sinus or hb symptoms.    . Also denies any obvious fluctuation of symptoms with weather or environmental changes or other aggravating or alleviating factors except as outlined above   No unusual exposure hx or h/o childhood pna/ asthma or knowledge of premature birth.  Current Allergies, Complete Past Medical History, Past Surgical History, Family History, and Social History  were reviewed in [place] [facility] record.  ROS  The following are not active complaints unless bolded Hoarseness, sore throat, dysphagia, dental problems, itching, sneezing,  nasal congestion or discharge of excess mucus or purulent secretions, ear ache,   fever, chills, sweats, unintended wt loss or wt gain, classically pleuritic or exertional cp,  orthopnea pnd or arm/hand swelling  or leg swelling, presyncope, palpitations, abdominal pain, anorexia, nausea, vomiting, diarrhea  or change in bowel habits or change in bladder habits, change in stools or change in urine, dysuria, hematuria,  rash, arthralgias, visual complaints, headache, numbness, weakness or ataxia or problems with walking or coordination,  change in mood or  memory.        Current Meds  Medication Sig  . albuterol (VENTOLIN HFA) 108 (90 Base) MCG/ACT inhaler Inhale 2 puffs into the lungs every 4 (four) hours as needed for wheezing or shortness of breath.   Marland Kitchen atorvastatin (LIPITOR) 40 MG tablet TAKE ONE TABLET BY MOUTH ONCE DAILY.  . budesonide-formoterol (SYMBICORT) 80-4.5 MCG/ACT inhaler Take 2 puffs first thing in am and then another 2 puffs about 12 hours later.  . diltiazem (CARDIZEM CD) 180 MG 24 hr capsule Take 1 capsule (180 mg total) by mouth daily.  Marland Kitchen escitalopram (LEXAPRO) 10 MG tablet Take 10 mg by mouth daily.   . famotidine (PEPCID) 20 MG tablet One after supper  . fexofenadine (ALLEGRA) 30  MG tablet Take 30 mg by mouth daily.   . fluticasone (FLONASE) 50 MCG/ACT nasal spray Place 1 spray into both nostrils daily as needed for allergies.   . furosemide (LASIX) 20 MG tablet TAKE (1) TABLET BY MOUTH ONCE DAILY FOR SWELLING.  Marland Kitchen levothyroxine (SYNTHROID) 100 MCG tablet Take 100 mcg by mouth daily before breakfast.   . metFORMIN (GLUCOPHAGE) 500 MG tablet Take 1 tablet (500 mg total) by mouth 2 (two) times daily with a meal.  . metoprolol tartrate (LOPRESSOR) 25 MG tablet TAKE (1) TABLET BY MOUTH TWICE DAILY. (Patient taking differently: Take 25 mg by mouth 2 (two) times daily.)  . Multiple Vitamin (MULITIVITAMIN WITH MINERALS) TABS Take 1 tablet by mouth daily.  . pantoprazole (PROTONIX) 40 MG tablet TAKE 1 TABLET 30 TO 60 MINUTES BEFORE FIRST MEAL OF THE DAY.  Marland Kitchen potassium chloride SA (K-DUR,KLOR-CON) 20 MEQ tablet Take 20 mEq by mouth daily.  Marland Kitchen warfarin (COUMADIN) 2.5 MG tablet TAKE AS DIRECTED BY COUMADIN CLINIC.            Past Medical History:  Diagnosis Date  . Anemia   . Atrial fibrillation ([place])   . Breast carcinoma ([place]) 1995   1995  . Cardioembolic stroke ([place]) 0/9326   Right frontal in 01/2012; normal carotid ultrasound; possible LAA thrombus by TEE; virtual complete neurologic recovery  . Chronic kidney disease, stage 2, mildly decreased GFR    GFR of approximately 60  . Depression   . Diverticulosis of colon (without mention of hemorrhage) 2012   Dr. Laural Golden  . Fasting hyperglycemia    120 fasting  . Gastroesophageal reflux disease   . Gastroparesis   . Hemorrhoids   . Hyperlipidemia   . Hypertension    pt denies 05/30/13     Dr Kathe Mariner  . Hyponatremia   . Rheumatic heart disease    a. 07/2013 Echo: Ef 55-60%, Mod AS/AI, Mod-Sev MS, sev dil LA, PASP 58;  b. 07/2013 TEE: EF 35-40%, mild-mod AS/AI, mod-sev MS, LA smoke;  c. 07/2013 Cath: elev R heart  pressures, nl cors.  . Shortness of breath     Past Medical History:    Hx of NEOPLASM,  MALIGNANT, BREAST (ICD-174.9)    HYPERLIPIDEMIA (ICD-272.4)    ASTHMA (ICD-493.90)    GERD with gastroparesis  Past Surgical History:    Breast surgery 1995    Heart valve surgery 1962 age 49 [facility] Mitral valve     Tubal ligation-1973   Objective:     09/16/2020      178   08/05/20 178 lb 12.8 oz (81.1 kg)  06/25/20 194 lb (88 kg)  06/23/20 180 lb (81.6 kg)     Vital signs reviewed  09/16/2020  - Note at rest 02 sats  97% on RA  Somber amb wf nad/ walks with cane   HEENT : pt wearing mask not removed for exam due to covid -19 concerns.    NECK :  without JVD/Nodes/TM/ nl carotid upstrokes bilaterally   LUNGS: no acc muscle use,  Nl contour chest which is clear to A and P bilaterally without cough on insp or exp maneuvers   CV:  RRR  no s3 or murmur or increase in P2, and no edema   ABD:  soft and nontender with nl inspiratory excursion in the supine position. No bruits or organomegaly appreciated, bowel sounds nl  MS:    ext warm without deformities, calf tenderness, cyanosis or clubbing No obvious joint restrictions   SKIN: warm and dry without lesions    NEURO:  alert, approp, nl sensorium with  no motor or cerebellar deficits apparent.        CXR PA and Lateral:   09/16/2020 :    I personally reviewed images and agree with radiology impression as follows:   Likely segmental atelectasis/consolidation favored to be within the right middle lobe. Follow-up recommended.             Assessment

## 2020-09-16 NOTE — Assessment & Plan Note (Signed)
Not optimally controlled on present regimen. I reviewed this with the patient and emphasized importance of follow-up with primary care.     >>>   Check cxr to be sure no evidence of chf contributing to cough

## 2020-09-16 NOTE — Assessment & Plan Note (Addendum)
Original onset 2010 on ACEi  [place] 09/2012: normal  Sinus ct 2014:  No chronic sinus disease. 10/16/12 Meth challenge  + for hyperreactive airways disease 04/30/20 CT head sinus nl - 06/23/2020  After extensive coaching inhaler device,  effectiveness =    75% from a baseline of 25% > continue flovent 44 2bid and prn saba plus max gerd rx  08/05/2020    changed to symb 80 2bid and given depomedrol 120 mg IM  > no better - 09/16/2020  After extensive coaching inhaler device,  effectiveness =    50% baseline of less than 25% - 09/16/2020 started gabapentin titrate up to 100 mg qid    Upper airway cough syndrome (previously labeled PNDS),  is so named because it's frequently impossible to sort out how much is  CR/sinusitis with freq throat clearing (which can be related to primary GERD)   vs  causing  secondary (" extra esophageal")  GERD from wide swings in gastric pressure that occur with throat clearing, often  promoting self use of mint and menthol lozenges that reduce the lower esophageal sphincter tone and exacerbate the problem further in a cyclical fashion.   These are the same pts (now being labeled as having "irritable larynx syndrome" by some cough centers) who not infrequently have a history of having failed to tolerate ace inhibitors,  dry powder inhalers or biphosphonates or report having atypical/extraesophageal reflux symptoms that don't respond to standard doses of PPI  and are easily confused as having aecopd or asthma flares by even experienced allergists/ pulmonologists (myself included).   Cough on insp, not better with steroids even IM is against asthma at this point (despite prev mct) and in favor of uacs/ irritable larynx so rec trial of gabapentin as above  Discussed in detail all the  indications, usual  risks and alternatives  relative to the benefits with patient who agrees to proceed with Rx as outlined.

## 2020-09-16 NOTE — Telephone Encounter (Signed)
Aware, see result note 

## 2020-09-16 NOTE — Telephone Encounter (Signed)
Received call report from [facility] with [facility] on patient's cxr done on 09/16/20. Dr. Melvyn Novas please review the result/impression copied below:  IMPRESSION: Likely segmental atelectasis/consolidation favored to be within the right middle lobe. Follow-up recommended.  Please advise, thank you.

## 2020-09-16 NOTE — Assessment & Plan Note (Addendum)
Classic pattern on cxr 09/16/2020   >>> rec mucinex dm/ f/u in 6 weeks with ct chest and sinus if not better    Each maintenance medication was reviewed in detail including emphasizing most importantly the difference between maintenance and prns and under what circumstances the prns are to be triggered using an action plan format where appropriate.  Total time for H and P, chart review, counseling, teaching device and generating customized AVS unique to this office visit / charting  > 30 min

## 2020-09-21 ENCOUNTER — Ambulatory Visit (INDEPENDENT_AMBULATORY_CARE_PROVIDER_SITE_OTHER): Payer: Medicare PPO | Admitting: *Deleted

## 2020-09-21 DIAGNOSIS — Z5181 Encounter for therapeutic drug level monitoring: Secondary | ICD-10-CM

## 2020-09-21 DIAGNOSIS — I639 Cerebral infarction, unspecified: Secondary | ICD-10-CM | POA: Diagnosis not present

## 2020-09-21 DIAGNOSIS — Z952 Presence of prosthetic heart valve: Secondary | ICD-10-CM | POA: Diagnosis not present

## 2020-09-21 DIAGNOSIS — I4891 Unspecified atrial fibrillation: Secondary | ICD-10-CM | POA: Diagnosis not present

## 2020-09-21 LAB — POCT INR: INR: 3.9 — AB (ref 2.0–3.0)

## 2020-09-21 NOTE — Patient Instructions (Signed)
Hold warfarin today then decrease dose to 1 tablet daily except 1 1/2 tablets on Sundays, Tuesdays and Thursdays  Recheck in 2.5 wk On Protonix in am and Pepcid in pm for reflux

## 2020-10-08 ENCOUNTER — Ambulatory Visit (INDEPENDENT_AMBULATORY_CARE_PROVIDER_SITE_OTHER): Payer: Medicare PPO | Admitting: *Deleted

## 2020-10-08 DIAGNOSIS — Z5181 Encounter for therapeutic drug level monitoring: Secondary | ICD-10-CM | POA: Diagnosis not present

## 2020-10-08 DIAGNOSIS — I4891 Unspecified atrial fibrillation: Secondary | ICD-10-CM | POA: Diagnosis not present

## 2020-10-08 DIAGNOSIS — I639 Cerebral infarction, unspecified: Secondary | ICD-10-CM | POA: Diagnosis not present

## 2020-10-08 DIAGNOSIS — Z952 Presence of prosthetic heart valve: Secondary | ICD-10-CM

## 2020-10-08 LAB — POCT INR: INR: 4.5 — AB (ref 2.0–3.0)

## 2020-10-08 NOTE — Patient Instructions (Signed)
Hold warfarin today then resume 1 tablet daily except 1 1/2 tablets on Sundays, Tuesdays and Thursdays  Recheck in 2 wk On Protonix in am and Pepcid in pm for reflux

## 2020-10-16 DIAGNOSIS — Z79899 Other long term (current) drug therapy: Secondary | ICD-10-CM | POA: Diagnosis not present

## 2020-10-16 DIAGNOSIS — I480 Paroxysmal atrial fibrillation: Secondary | ICD-10-CM | POA: Diagnosis not present

## 2020-10-16 DIAGNOSIS — E1129 Type 2 diabetes mellitus with other diabetic kidney complication: Secondary | ICD-10-CM | POA: Diagnosis not present

## 2020-10-16 DIAGNOSIS — N183 Chronic kidney disease, stage 3 unspecified: Secondary | ICD-10-CM | POA: Diagnosis not present

## 2020-10-22 ENCOUNTER — Ambulatory Visit (INDEPENDENT_AMBULATORY_CARE_PROVIDER_SITE_OTHER): Payer: Medicare PPO | Admitting: *Deleted

## 2020-10-22 DIAGNOSIS — Z952 Presence of prosthetic heart valve: Secondary | ICD-10-CM | POA: Diagnosis not present

## 2020-10-22 DIAGNOSIS — I639 Cerebral infarction, unspecified: Secondary | ICD-10-CM

## 2020-10-22 DIAGNOSIS — I4891 Unspecified atrial fibrillation: Secondary | ICD-10-CM | POA: Diagnosis not present

## 2020-10-22 DIAGNOSIS — Z5181 Encounter for therapeutic drug level monitoring: Secondary | ICD-10-CM

## 2020-10-22 LAB — POCT INR: INR: 2.8 (ref 2.0–3.0)

## 2020-10-22 NOTE — Patient Instructions (Signed)
Continue warfarin 1 tablet daily except 1 1/2 tablets on Sundays, Tuesdays and Thursdays  Recheck in 3 wk On Protonix in am and Pepcid in pm for reflux

## 2020-10-23 DIAGNOSIS — R7309 Other abnormal glucose: Secondary | ICD-10-CM | POA: Diagnosis not present

## 2020-10-23 DIAGNOSIS — R0982 Postnasal drip: Secondary | ICD-10-CM | POA: Diagnosis not present

## 2020-10-23 DIAGNOSIS — N1831 Chronic kidney disease, stage 3a: Secondary | ICD-10-CM | POA: Diagnosis not present

## 2020-10-23 DIAGNOSIS — E1122 Type 2 diabetes mellitus with diabetic chronic kidney disease: Secondary | ICD-10-CM | POA: Diagnosis not present

## 2020-10-28 ENCOUNTER — Ambulatory Visit (HOSPITAL_COMMUNITY)
Admission: RE | Admit: 2020-10-28 | Discharge: 2020-10-28 | Disposition: A | Discharge status: Home / Self Care | Payer: Medicare PPO | Source: Ambulatory Visit | Attending: Internal Medicine | Admitting: Internal Medicine

## 2020-10-28 ENCOUNTER — Other Ambulatory Visit: Payer: Self-pay

## 2020-10-28 DIAGNOSIS — R0609 Other forms of dyspnea: Secondary | ICD-10-CM

## 2020-10-28 DIAGNOSIS — R058 Other specified cough: Secondary | ICD-10-CM | POA: Diagnosis not present

## 2020-10-28 DIAGNOSIS — R0600 Dyspnea, unspecified: Secondary | ICD-10-CM | POA: Insufficient documentation

## 2020-10-28 DIAGNOSIS — J9811 Atelectasis: Secondary | ICD-10-CM | POA: Diagnosis not present

## 2020-10-28 DIAGNOSIS — I517 Cardiomegaly: Secondary | ICD-10-CM | POA: Diagnosis not present

## 2020-10-29 ENCOUNTER — Ambulatory Visit: Payer: Medicare PPO | Admitting: Internal Medicine

## 2020-10-29 ENCOUNTER — Encounter: Payer: Self-pay | Admitting: Internal Medicine

## 2020-10-29 DIAGNOSIS — R058 Other specified cough: Secondary | ICD-10-CM | POA: Diagnosis not present

## 2020-10-29 DIAGNOSIS — J9811 Atelectasis: Secondary | ICD-10-CM | POA: Diagnosis not present

## 2020-10-29 DIAGNOSIS — R059 Cough, unspecified: Secondary | ICD-10-CM

## 2020-10-29 DIAGNOSIS — J9819 Other pulmonary collapse: Secondary | ICD-10-CM | POA: Diagnosis not present

## 2020-10-29 NOTE — Patient Instructions (Signed)
We will call to schedule you CT sinus and chest and call you with the results   No change in medications

## 2020-10-29 NOTE — Progress Notes (Signed)
[name], female    DOB: 1946/04/23      MRN: [id number]   Brief patient profile:  75 yowf never smoker with a h/o sob related to valvular heart dz    Seen prior to Epic in [place]: Feb 20, 2009 initial pulmonary eval started aerobics 3 months prior to OV  X 30 min weekly and tolerated well with improving ex tol until 3 weeks prior to OV   noted decrease tolerance and since then gradually worse to point where walking to mailbox slt uphill or climbing steps has to stop at top.  In retrospect coughing for 3 months dry, worse immediately on lying down. due to the cough lisnopril was stopped and could not tolerate the substitute and cough hasn't changed but breathing got worse. prednisone no help so far and proaire no benefit. rec GERD diet   continue prilosec and reglan stop prednisone and proaire  avoid salt  maxzide 25 one each am and Dr Izell [place] office   10/16/12 Meth challenge  + for hyperreactive airways disease  clance eval 05/14/13 [name] that at this point is clearly upper airway in origin.  [name], but her current cough failed to respond to prednisone tapers.  Therefore it is unlikely that it is related to cough variant asthma.  She does not have chronic sinusitis by her CT scan, and there is nothing on chest x-ray earlier in the year to explain her cough.  She does have a history of breast cancer, but there has been no evidence for recurrence.  [name], but this has an extremely low yield.  [name], and therefore I am concerned she may be having laryngopharyngeal reflux as a cause for her cough.  I also think she may have a cyclical mechanism as well she did have some response to tramadol, and we could consider a trial of gabapentin  AVR  07/18/13   04/2020 echo 1. Left ventricular ejection fraction, by estimation, is 60 to 65%. The  left  ventricle has normal function. The left ventricle has no regional  wall motion abnormalities. Left ventricular diastolic parameters are  indeterminate.  2. Right ventricular systolic function is low normal. The right  ventricular size is normal. There is moderately elevated pulmonary artery  systolic pressure. The estimated right ventricular systolic pressure is  23.5 mmHg.  3. Left atrial size was mildly dilated.  4. The mitral valve has been repaired/replaced. No evidence of mitral  valve regurgitation. The mean mitral valve gradient is 5.0 mmHg. There is  a 25 mm Magna Ease bioprosthetic valve present in the mitral position.  Grossly normal function.  5. The aortic valve has been repaired/replaced. Aortic valve  regurgitation is not visualized. There is a 21 mm Magna Ease bioprosthetic  valve present in the aortic position. Aortic valve mean gradient measures  7.0 mmHg. Grossly normal function.  6. The inferior vena cava is normal in size with greater than 50%  respiratory variability, suggesting right atrial pressure of 3 mmHg.       History of Present Illness  06/23/2020  Pulmonary/ 1st office eval/ Nazario Russom /  Office / re establish re cough and sob on flovent 44 2bid  For cough variant asthma  Chief Complaint  Patient presents with  . Consult    Patient has a dry cough that has been going on  for many years but worse over the last 6 months. Shortness of breath with exertion.   Dyspnea:  Walks dog x 30 min / mostly flat uses cane to balance/ steps ok slowly due to breathing problems x sev years prior to OV   Cough: worse in afternoons and sometimes hs has to sit up 30 degrees to prevent cough supine  Sleep: ok as long as head is up 30 degrees new x 2 years  SABA use: sev times a day seems to help at 4 pm rec GERD diet  Pantoprazole (protonix) 40 mg   Take  30-60 min before first meal of the day and Pepcid (famotidine)  20 mg one after supper until return to office -  this is the best way to tell whether stomach acid is contributing to your problem.   Plan A = Automatic = Always=    flovent Take 2 puffs first thing in am and then another 2 puffs about 12 hours later.  Work on inhaler technique:  Plan B = Backup (to supplement plan A, not to replace it) Only use your albuterol inhaler as a rescue medication   Please schedule a follow up office visit in 6 weeks, call sooner if needed - bring inhalers  Next step ? Gabapentin?   08/05/2020  f/u ov/[place] office/Bessy Reaney re: cough > sob x years  Chief Complaint  Patient presents with  . Follow-up    non productive cough   Dyspnea:  Not limited by breathing from desired activities  / plays golf/ walks dog  Cough: dry ? Worse with activity (husband says not reproducible ?  worse p supper  Sleeping: most nights waking up  SABA use: once a day  02: none  rec Depomedrol 120 mg IM  Plan A = Automatic = Always=    symbicort 80 Take 2 puffs first thing in am and then another 2 puffs about 12 hours later.  Work on inhaler technique:     Plan B = Backup (to supplement plan A, not to replace it) Only use your albuterol inhaler as a rescue medication   Please schedule a follow up office visit in 6 weeks, call sooner if needed  - bring inhalers and the empty cannister of symbicort    09/16/2020  f/u ov/[place] office/Dariana Garbett re: cough  x years ? Decades "nothing has ever helped" no better p depo last ov, not ever transiently  Chief Complaint  Patient presents with  . Follow-up    Cough is unchanged since the last visit- prod with white sputum.  She is using her albuterol inhaler once per day usually around 4-5 pm.    Dyspnea:  Walks dog, uses cane / some balance issues limiting more than sob and still plays golf Cough: worse  p supper despite pepcid Sleeping: does better in recliner 30 degrees  SABA use: once a day around 5pm seems to help though note hfa very poor baseline  02: none Work on inhaler technique:    Gabapentin 100 mg twice a day for a week then 4 x daily  Please remember to go to the  x-ray department  @  [facility] for your tests - we will call you with the results when they are available    Add Needs f/u cxr  ? rml syndrome p rx mucinex dm > then ct chest/ sinus if not better  and check allergy profile on return      10/29/2020  f/u ov/[place] office/Conny Situ re:  chronic cough  Chief Complaint  Patient presents with  . Followup     Cough is unchanged since the last visit. She has used her albuterol inhaler twice since her last visit.    Dyspnea:  Slowed more balance than breathing, walks with suction cup cane Cough: dry cough day > noct Sleeping: able to sleep sitting up 30 degrees s sob or cough  SABA use: rare 02: none    No obvious day to day or daytime variability or assoc excess/ purulent sputum or mucus plugs or hemoptysis or cp or chest tightness, subjective wheeze or overt sinus or hb symptoms.    Also denies any obvious fluctuation of symptoms with weather or environmental changes or other aggravating or alleviating factors except as outlined above   No unusual exposure hx or h/o childhood pna/ asthma or knowledge of premature birth.  Current Allergies, Complete Past Medical History, Past Surgical History, Family History, and Social History were reviewed in [employer] record.  ROS  The following are not active complaints unless bolded Hoarseness, sore throat, dysphagia, dental problems, itching, sneezing,  nasal congestion or discharge of excess mucus or purulent secretions, ear ache,   fever, chills, sweats, unintended wt loss or wt gain, classically pleuritic or exertional cp,  orthopnea pnd or arm/hand swelling  or leg swelling, presyncope, palpitations, abdominal pain, anorexia, nausea, vomiting, diarrhea  or change in bowel habits or change in bladder habits, change in stools or change in urine, dysuria, hematuria,  rash, arthralgias,  visual complaints, headache, numbness, weakness or ataxia or problems with walking or coordination,  change in mood or  memory.        Current Meds  Medication Sig  . albuterol (VENTOLIN HFA) 108 (90 Base) MCG/ACT inhaler Inhale 2 puffs into the lungs every 4 (four) hours as needed for wheezing or shortness of breath.   Marland Kitchen atorvastatin (LIPITOR) 40 MG tablet TAKE ONE TABLET BY MOUTH ONCE DAILY.  . budesonide-formoterol (SYMBICORT) 80-4.5 MCG/ACT inhaler Take 2 puffs first thing in am and then another 2 puffs about 12 hours later.  Marland Kitchen dextromethorphan-guaiFENesin (MUCINEX DM) 30-600 MG 12hr tablet Take 1 tablet by mouth 2 (two) times daily.  Marland Kitchen diltiazem (CARDIZEM CD) 180 MG 24 hr capsule Take 1 capsule (180 mg total) by mouth daily.  Marland Kitchen escitalopram (LEXAPRO) 10 MG tablet Take 10 mg by mouth daily.   . famotidine (PEPCID) 20 MG tablet One after supper  . fexofenadine (ALLEGRA) 30 MG tablet Take 30 mg by mouth daily.   . fluticasone (FLONASE) 50 MCG/ACT nasal spray Place 1 spray into both nostrils daily as needed for allergies.   . furosemide (LASIX) 20 MG tablet TAKE (1) TABLET BY MOUTH ONCE DAILY FOR SWELLING.  . gabapentin (NEURONTIN) 100 MG capsule Take 1 capsule (100 mg total) by mouth 4 (four) times daily.  Marland Kitchen levothyroxine (SYNTHROID) 100 MCG tablet Take 100 mcg by mouth daily before breakfast.   . metFORMIN (GLUCOPHAGE) 500 MG tablet Take 1 tablet (500 mg total) by mouth 2 (two) times daily with a meal.  . metoprolol tartrate (LOPRESSOR) 25 MG tablet TAKE (1) TABLET BY MOUTH TWICE DAILY. (Patient taking differently: Take 25 mg by mouth 2 (two) times daily.)  . Multiple Vitamin (MULITIVITAMIN WITH MINERALS) TABS Take 1 tablet by mouth daily.  . pantoprazole (PROTONIX) 40 MG tablet TAKE 1 TABLET 30 TO 60 MINUTES BEFORE FIRST MEAL OF THE DAY.  Marland Kitchen potassium chloride SA (K-DUR,KLOR-CON) 20 MEQ tablet Take 20 mEq  by mouth daily.  Marland Kitchen warfarin (COUMADIN) 2.5 MG tablet TAKE AS DIRECTED BY COUMADIN  CLINIC.          Past Medical History:  Diagnosis Date  . Anemia   . Atrial fibrillation ([place])   . Breast carcinoma ([place]) 1995   1995  . Cardioembolic stroke ([place]) 05/3381   Right frontal in 01/2012; normal carotid ultrasound; possible LAA thrombus by TEE; virtual complete neurologic recovery  . Chronic kidney disease, stage 2, mildly decreased GFR    GFR of approximately 60  . Depression   . Diverticulosis of colon (without mention of hemorrhage) 2012   Dr. Laural Golden  . Fasting hyperglycemia    120 fasting  . Gastroesophageal reflux disease   . Gastroparesis   . Hemorrhoids   . Hyperlipidemia   . Hypertension    pt denies 05/30/13     Dr Kathe Mariner  . Hyponatremia   . Rheumatic heart disease    a. 07/2013 Echo: Ef 55-60%, Mod AS/AI, Mod-Sev MS, sev dil LA, PASP 58;  b. 07/2013 TEE: EF 35-40%, mild-mod AS/AI, mod-sev MS, LA smoke;  c. 07/2013 Cath: elev R heart pressures, nl cors.  . Shortness of breath     Past Medical History:    Hx of NEOPLASM, MALIGNANT, BREAST (ICD-174.9)    HYPERLIPIDEMIA (ICD-272.4)    ASTHMA (ICD-493.90)    GERD with gastroparesis  Past Surgical History:    Breast surgery 1995    Heart valve surgery 1962 age 46 [facility] Mitral valve     Tubal ligation-1973   Objective:    10/29/2020        177 09/16/2020      178   08/05/20 178 lb 12.8 oz (81.1 kg)  06/25/20 194 lb (88 kg)  06/23/20 180 lb (81.6 kg)    Vital signs reviewed  10/29/2020  - Note at rest 02 sats  96% on RA   General appearance:    amb wf walks with cane      HEENT : pt wearing mask not removed for exam due to covid -19 concerns.    NECK :  without JVD/Nodes/TM/ nl carotid upstrokes bilaterally   LUNGS: no acc muscle use,  Nl contour chest which is clear to A and P bilaterally without cough on insp or exp maneuvers   CV:  RRR  no s3 or murmur or increase in P2, and no edema   ABD:  soft and nontender with nl inspiratory excursion in the supine position. No bruits  or organomegaly appreciated, bowel sounds nl  MS:   ext warm without deformities, calf tenderness, cyanosis or clubbing No obvious joint restrictions   SKIN: warm and dry without lesions    NEURO:  alert, approp, nl sensorium with  no motor or cerebellar deficits apparent.      I personally reviewed images and agree with radiology impression as follows:  CXR:   10/28/20 Dense right middle lobe consolidation and volume loss, unchanged since 09/16/2020, although new since 2018 radiograph. Findings favor complete right middle lobe atelectasis. An obstructing central pulmonary lesion cannot be excluded. Chest CT with IV contrast suggested for further evaluation.  Stable mild cardiomegaly without pulmonary edema.       Assessment

## 2020-10-30 ENCOUNTER — Encounter: Payer: Self-pay | Admitting: Internal Medicine

## 2020-10-30 ENCOUNTER — Other Ambulatory Visit: Payer: Self-pay | Admitting: Cardiology

## 2020-10-30 NOTE — Assessment & Plan Note (Signed)
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            Each maintenance medication was reviewed in detail including emphasizing most importantly the difference between maintenance and prns and under what circumstances the prns are to be triggered using an action plan format where appropriate.  Total time for H and P, chart review, counseling, reviewing  and generating customized AVS unique to this office visit / same day charting = 15 min

## 2020-10-30 NOTE — Assessment & Plan Note (Addendum)
[name] 09/2012: normal  Sinus ct 2014:  No chronic sinus disease. 10/16/12 Meth challenge  + for hyperreactive airways disease 04/30/20 CT head sinus nl - 06/23/2020  After extensive coaching inhaler device,  effectiveness =    75% from a baseline of 25% > continue flovent 44 2bid and prn saba plus max gerd rx  08/05/2020    changed to symb 80 2bid and given depomedrol 120 mg IM  > no better - 09/16/2020  After extensive coaching inhaler device,  effectiveness =    50% baseline of less than 25% - 09/16/2020 started gabapentin titrate up to 100 mg qid   In terms of cough, All goals of chronic asthma control met including optimal function and elimination of symptoms with minimal need for rescue therapy.  Contingencies discussed in full including contacting this office immediately if not controlling the symptoms using the rule of two's.     No need to repeat sinus CT, will cancel

## 2020-11-03 ENCOUNTER — Other Ambulatory Visit (HOSPITAL_COMMUNITY)
Admission: RE | Admit: 2020-11-03 | Discharge: 2020-11-03 | Disposition: A | Discharge status: Home / Self Care | Payer: Medicare PPO | Source: Ambulatory Visit | Attending: Internal Medicine | Admitting: Internal Medicine

## 2020-11-03 DIAGNOSIS — R059 Cough, unspecified: Secondary | ICD-10-CM | POA: Insufficient documentation

## 2020-11-03 LAB — BASIC METABOLIC PANEL
Anion gap: 9 (ref 5–15)
BUN: 13 mg/dL (ref 8–23)
CO2: 24 mmol/L (ref 22–32)
Calcium: 8.8 mg/dL — ABNORMAL LOW (ref 8.9–10.3)
Chloride: 102 mmol/L (ref 98–111)
Creatinine, Ser: 1 mg/dL (ref 0.44–1.00)
GFR, Estimated: 58 mL/min — ABNORMAL LOW (ref >60)
Glucose, Bld: 144 mg/dL — ABNORMAL HIGH (ref 70–99)
Potassium: 4.4 mmol/L (ref 3.5–5.1)
Sodium: 135 mmol/L (ref 135–145)

## 2020-11-05 ENCOUNTER — Other Ambulatory Visit: Payer: Self-pay

## 2020-11-05 ENCOUNTER — Ambulatory Visit: Payer: Medicare PPO | Admitting: Podiatry

## 2020-11-05 DIAGNOSIS — R52 Pain, unspecified: Secondary | ICD-10-CM | POA: Diagnosis not present

## 2020-11-05 DIAGNOSIS — M2042 Other hammer toe(s) (acquired), left foot: Secondary | ICD-10-CM | POA: Diagnosis not present

## 2020-11-05 DIAGNOSIS — L84 Corns and callosities: Secondary | ICD-10-CM | POA: Diagnosis not present

## 2020-11-06 ENCOUNTER — Encounter: Payer: Self-pay | Admitting: Podiatry

## 2020-11-06 NOTE — Progress Notes (Signed)
  Subjective:  Patient ID: [name], female    DOB: 1946-08-11,  MRN: [id number]  Chief Complaint  Patient presents with  . Warts    Left foot between 4th and 5th toe possible wart. PT stated that it comes and goes .    75 y.o. female presents with the above complaint. History confirmed with patient.   Objective:  Physical Exam: warm, good capillary refill, no trophic changes or ulcerative lesions, normal DP and PT pulses and normal sensory exam. Left Foot: medial 5th toe heloma molle, painful  Assessment:  No diagnosis found.   Plan:  Patient was evaluated and treated and all questions answered.  All symptomatic hyperkeratoses were safely debrided with a sterile #15 blade to patient's level of comfort without incident. We discussed preventative and palliative care of these lesions including supportive and accommodative shoegear, padding, prefabricated and custom molded accommodative orthoses, use of a pumice stone and lotions/creams daily.  Silicone pads dispensed  Discussed etiology and how they relate to hammer toes. Discussed option of arthroplasty if padding and debridement should fail. Will revisit PRN  Return in about 2 months (around 01/03/2021).

## 2020-11-12 ENCOUNTER — Ambulatory Visit (INDEPENDENT_AMBULATORY_CARE_PROVIDER_SITE_OTHER): Payer: Medicare PPO | Admitting: *Deleted

## 2020-11-12 DIAGNOSIS — I4891 Unspecified atrial fibrillation: Secondary | ICD-10-CM

## 2020-11-12 DIAGNOSIS — Z5181 Encounter for therapeutic drug level monitoring: Secondary | ICD-10-CM

## 2020-11-12 DIAGNOSIS — Z952 Presence of prosthetic heart valve: Secondary | ICD-10-CM | POA: Diagnosis not present

## 2020-11-12 DIAGNOSIS — I639 Cerebral infarction, unspecified: Secondary | ICD-10-CM

## 2020-11-12 LAB — POCT INR: INR: 4.4 — AB (ref 2.0–3.0)

## 2020-11-12 NOTE — Patient Instructions (Signed)
Hold warfarin tonight then resume 1 tablet daily except 1 1/2 tablets on Sundays, Tuesdays and Thursdays Eat extra greens today  Recheck in 3 wk On Protonix in am and Pepcid in pm for reflux

## 2020-11-26 ENCOUNTER — Other Ambulatory Visit: Payer: Self-pay

## 2020-11-26 ENCOUNTER — Ambulatory Visit (HOSPITAL_COMMUNITY)
Admission: RE | Admit: 2020-11-26 | Discharge: 2020-11-26 | Disposition: A | Discharge status: Home / Self Care | Payer: Medicare PPO | Source: Ambulatory Visit | Attending: Internal Medicine | Admitting: Internal Medicine

## 2020-11-26 DIAGNOSIS — I6389 Other cerebral infarction: Secondary | ICD-10-CM | POA: Diagnosis not present

## 2020-11-26 DIAGNOSIS — J9811 Atelectasis: Secondary | ICD-10-CM | POA: Diagnosis not present

## 2020-11-26 DIAGNOSIS — R059 Cough, unspecified: Secondary | ICD-10-CM | POA: Insufficient documentation

## 2020-11-26 DIAGNOSIS — J984 Other disorders of lung: Secondary | ICD-10-CM | POA: Diagnosis not present

## 2020-11-26 DIAGNOSIS — J9819 Other pulmonary collapse: Secondary | ICD-10-CM | POA: Diagnosis not present

## 2020-11-26 DIAGNOSIS — J90 Pleural effusion, not elsewhere classified: Secondary | ICD-10-CM | POA: Diagnosis not present

## 2020-11-26 DIAGNOSIS — J329 Chronic sinusitis, unspecified: Secondary | ICD-10-CM | POA: Diagnosis not present

## 2020-11-26 DIAGNOSIS — J929 Pleural plaque without asbestos: Secondary | ICD-10-CM | POA: Diagnosis not present

## 2020-11-26 DIAGNOSIS — I6523 Occlusion and stenosis of bilateral carotid arteries: Secondary | ICD-10-CM | POA: Diagnosis not present

## 2020-11-26 DIAGNOSIS — I700 Atherosclerosis of aorta: Secondary | ICD-10-CM | POA: Diagnosis not present

## 2020-11-26 MED ORDER — IOHEXOL 300 MG/ML  SOLN
75.0000 mL | Freq: Once | INTRAMUSCULAR | Status: AC | PRN
  Administered 2020-11-26: 75 mL via INTRAVENOUS

## 2020-12-02 ENCOUNTER — Ambulatory Visit (INDEPENDENT_AMBULATORY_CARE_PROVIDER_SITE_OTHER): Payer: Medicare PPO | Admitting: *Deleted

## 2020-12-02 DIAGNOSIS — I4891 Unspecified atrial fibrillation: Secondary | ICD-10-CM

## 2020-12-02 DIAGNOSIS — I639 Cerebral infarction, unspecified: Secondary | ICD-10-CM

## 2020-12-02 DIAGNOSIS — Z5181 Encounter for therapeutic drug level monitoring: Secondary | ICD-10-CM

## 2020-12-02 LAB — POCT INR: INR: 2.4 (ref 2.0–3.0)

## 2020-12-02 NOTE — Patient Instructions (Signed)
Take warfarin 1 1/2 tablets tonight then resume 1 tablet daily except 1 1/2 tablets on Sundays, Tuesdays and Thursdays Continue greens Recheck in 4 wk On Protonix in am and Pepcid in pm for reflux

## 2020-12-03 NOTE — Progress Notes (Signed)
Tried calling the pt and there was no answer, VM was full so unable to leave msg, WCB.

## 2020-12-03 NOTE — Progress Notes (Signed)
Tried calling the pt and there was no answer and VM full so unable to leave msg,will call back.

## 2020-12-09 ENCOUNTER — Other Ambulatory Visit: Payer: Self-pay | Admitting: Cardiology

## 2020-12-10 ENCOUNTER — Other Ambulatory Visit: Payer: Self-pay | Admitting: Internal Medicine

## 2020-12-10 DIAGNOSIS — J9819 Other pulmonary collapse: Secondary | ICD-10-CM

## 2020-12-10 NOTE — Progress Notes (Signed)
Spoke with pt and notified of results per Dr. Wert. Pt verbalized understanding and denied any questions. 

## 2020-12-10 NOTE — Progress Notes (Signed)
Spoke with pt and notified of results per Dr. Melvyn Novas. Pt verbalized understanding and denied any questions. Order for 3 month CT placed- CT Chest without CM per Dr Melvyn Novas.

## 2020-12-29 ENCOUNTER — Encounter (INDEPENDENT_AMBULATORY_CARE_PROVIDER_SITE_OTHER): Payer: Self-pay | Admitting: *Deleted

## 2020-12-30 ENCOUNTER — Ambulatory Visit (INDEPENDENT_AMBULATORY_CARE_PROVIDER_SITE_OTHER): Payer: Medicare PPO | Admitting: *Deleted

## 2020-12-30 DIAGNOSIS — I4891 Unspecified atrial fibrillation: Secondary | ICD-10-CM

## 2020-12-30 DIAGNOSIS — Z5181 Encounter for therapeutic drug level monitoring: Secondary | ICD-10-CM | POA: Diagnosis not present

## 2020-12-30 DIAGNOSIS — Z952 Presence of prosthetic heart valve: Secondary | ICD-10-CM | POA: Diagnosis not present

## 2020-12-30 DIAGNOSIS — I639 Cerebral infarction, unspecified: Secondary | ICD-10-CM | POA: Diagnosis not present

## 2020-12-30 LAB — POCT INR: INR: 2.9 (ref 2.0–3.0)

## 2020-12-30 NOTE — Patient Instructions (Signed)
Continue warfarin 1 tablet daily except 1 1/2 tablets on Sundays, Tuesdays and Thursdays Continue greens Recheck in 4 wk On Protonix in am and Pepcid in pm for reflux

## 2021-01-04 DIAGNOSIS — E1129 Type 2 diabetes mellitus with other diabetic kidney complication: Secondary | ICD-10-CM | POA: Diagnosis not present

## 2021-01-04 DIAGNOSIS — I5032 Chronic diastolic (congestive) heart failure: Secondary | ICD-10-CM | POA: Diagnosis not present

## 2021-01-04 DIAGNOSIS — I480 Paroxysmal atrial fibrillation: Secondary | ICD-10-CM | POA: Diagnosis not present

## 2021-01-04 DIAGNOSIS — E785 Hyperlipidemia, unspecified: Secondary | ICD-10-CM | POA: Diagnosis not present

## 2021-01-04 DIAGNOSIS — Z79899 Other long term (current) drug therapy: Secondary | ICD-10-CM | POA: Diagnosis not present

## 2021-01-04 DIAGNOSIS — E039 Hypothyroidism, unspecified: Secondary | ICD-10-CM | POA: Diagnosis not present

## 2021-01-04 DIAGNOSIS — N183 Chronic kidney disease, stage 3 unspecified: Secondary | ICD-10-CM | POA: Diagnosis not present

## 2021-01-05 ENCOUNTER — Ambulatory Visit: Payer: Medicare PPO | Admitting: Podiatry

## 2021-01-11 DIAGNOSIS — I700 Atherosclerosis of aorta: Secondary | ICD-10-CM | POA: Diagnosis not present

## 2021-01-11 DIAGNOSIS — E1122 Type 2 diabetes mellitus with diabetic chronic kidney disease: Secondary | ICD-10-CM | POA: Diagnosis not present

## 2021-01-11 DIAGNOSIS — Z6832 Body mass index (BMI) 32.0-32.9, adult: Secondary | ICD-10-CM | POA: Diagnosis not present

## 2021-01-11 DIAGNOSIS — I483 Typical atrial flutter: Secondary | ICD-10-CM | POA: Diagnosis not present

## 2021-01-11 DIAGNOSIS — F325 Major depressive disorder, single episode, in full remission: Secondary | ICD-10-CM | POA: Diagnosis not present

## 2021-01-11 DIAGNOSIS — I5032 Chronic diastolic (congestive) heart failure: Secondary | ICD-10-CM | POA: Diagnosis not present

## 2021-01-14 ENCOUNTER — Other Ambulatory Visit: Payer: Self-pay

## 2021-01-14 ENCOUNTER — Encounter: Payer: Self-pay | Admitting: *Deleted

## 2021-01-14 ENCOUNTER — Ambulatory Visit: Payer: Medicare PPO | Admitting: Cardiology

## 2021-01-14 ENCOUNTER — Telehealth: Payer: Self-pay | Admitting: Cardiology

## 2021-01-14 ENCOUNTER — Encounter: Payer: Self-pay | Admitting: Cardiology

## 2021-01-14 VITALS — BP 114/76 | HR 96 | Ht 62.5 in | Wt 177.2 lb

## 2021-01-14 DIAGNOSIS — I6523 Occlusion and stenosis of bilateral carotid arteries: Secondary | ICD-10-CM

## 2021-01-14 DIAGNOSIS — R079 Chest pain, unspecified: Secondary | ICD-10-CM

## 2021-01-14 DIAGNOSIS — I4891 Unspecified atrial fibrillation: Secondary | ICD-10-CM

## 2021-01-14 DIAGNOSIS — I38 Endocarditis, valve unspecified: Secondary | ICD-10-CM

## 2021-01-14 NOTE — Patient Instructions (Signed)
Your physician recommends that you schedule a follow-up appointment in: [place]  Your physician recommends that you continue on your current medications as directed. Please refer to the Current Medication list given to you today.  Your physician has requested that you have a carotid duplex. This test is an ultrasound of the carotid arteries in your neck. It looks at blood flow through these arteries that supply the brain with blood. Allow one hour for this exam. There are no restrictions or special instructions.  Your physician has requested that you have a lexiscan myoview. For further information please visit [email or web address]. Please follow instruction sheet, as given.  Thank you for choosing [facility]!!

## 2021-01-14 NOTE — Telephone Encounter (Signed)
Pre-cert Verification for the following procedure    LEXISCAN   DATE:  01/28/2021  [place]

## 2021-01-14 NOTE — Progress Notes (Signed)
Clinical Summary [name] is a 75 y.o.female seen today for the following medical problems.    1. Valvular heart disease  - 07/18/13 patient underwent MVR with 25 mm Edwards Magna-ease pericardial valve and also AVR with 21 mm Edwards Magna-ease pericardial valve    - has had some issues with amoxicillin in the past, upset stomach and yeast infectionwhen having to take prior to dental surgery due to her artificial valves.   - no recent edema. Taking lasix 20mg  daily, extra as needed - chronic SOB/DOE which has worsened - seen by pulmonary for asthma. Some coughing, wheezing.    2. Chest tightness - midchest, comes on activity.  - 5/10 in severity. Some associated SOB - tightness last few seconds. Not positonal. Better with rest   3. Afib  - hx of cardioembolic CVA, has been on longterm anticoag - compliant wit m - no palpitations   4. Hyperlipidemia -recent labs with pcp, compliant with stin   5. Carotid stenosis - 07/2013 [place] with mild bilateral stenosis 2018 mild disease   6. Syncope - admission 04/2020 with syncope - no acute findings by echo - CT head benign - Cr was elevated to 1.49  - weight loss - orthostatics show significant rise in HR with standing, HR lying 60 to 112 standing. Soft bp's that are low with standing to 86/50.   - ongoing fatigue x 3 months - some cough, SOB  - in garden picking beans, not sure how long. In the 90s that day - denies any significant prodrome.   - glassses x 4, coffee x 1 cup decaf, diet sun drop x 2 cans, decaf tea, no EtoH - chronic issues with orthostatic dizziness with standing.   - we stopped her benicar and changed her lasix to 20mg  prn due to this episode - now back to lasix 20mg  daily due to increased swelling on just prn dosing.  - no recurrent syncope  SH: daughter with Crohns with disease, they spend a lot of time supporting her  Past Medical History:  Diagnosis Date  . Anemia    . Atrial fibrillation ([place])   . Breast carcinoma ([place]) 1995   1995  . Cardioembolic stroke ([place]) 03/6062   Right frontal in 01/2012; normal carotid ultrasound; possible LAA thrombus by TEE; virtual complete neurologic recovery  . Chronic kidney disease, stage 2, mildly decreased GFR    GFR of approximately 60  . Depression   . Diverticulosis of colon (without mention of hemorrhage) 2012   Dr. Laural Golden  . Fasting hyperglycemia    120 fasting  . Gastroesophageal reflux disease   . Gastroparesis   . Hemorrhoids   . Hyperlipidemia   . Hypertension    pt denies 05/30/13     Dr Kathe Mariner  . Hyponatremia   . Rheumatic heart disease    a. 07/2013 Echo: Ef 55-60%, Mod AS/AI, Mod-Sev MS, sev dil LA, PASP 58;  b. 07/2013 TEE: EF 35-40%, mild-mod AS/AI, mod-sev MS, LA smoke;  c. 07/2013 Cath: elev R heart pressures, nl cors.  . Shortness of breath      Allergies  Allergen Reactions  . Amiodarone Rash     Current Outpatient Medications  Medication Sig Dispense Refill  . albuterol (VENTOLIN HFA) 108 (90 Base) MCG/ACT inhaler Inhale 2 puffs into the lungs every 4 (four) hours as needed for wheezing or shortness of breath.     Marland Kitchen atorvastatin (LIPITOR) 40 MG tablet TAKE ONE TABLET BY  MOUTH ONCE DAILY. 90 tablet 1  . budesonide-formoterol (SYMBICORT) 80-4.5 MCG/ACT inhaler Take 2 puffs first thing in am and then another 2 puffs about 12 hours later. 1 each 11  . dextromethorphan-guaiFENesin (MUCINEX DM) 30-600 MG 12hr tablet Take 1 tablet by mouth 2 (two) times daily.    Marland Kitchen diltiazem (CARDIZEM CD) 180 MG 24 hr capsule Take 1 capsule (180 mg total) by mouth daily. 30 capsule 1  . escitalopram (LEXAPRO) 10 MG tablet Take 10 mg by mouth daily.     . famotidine (PEPCID) 20 MG tablet One after supper 30 tablet 11  . fexofenadine (ALLEGRA) 30 MG tablet Take 30 mg by mouth daily.     . fluticasone (FLONASE) 50 MCG/ACT nasal spray Place 1 spray into both nostrils daily as needed for allergies.      . furosemide (LASIX) 20 MG tablet TAKE (1) TABLET BY MOUTH ONCE DAILY FOR SWELLING. 90 tablet 1  . gabapentin (NEURONTIN) 100 MG capsule Take 1 capsule (100 mg total) by mouth 4 (four) times daily. 120 capsule 2  . levothyroxine (SYNTHROID) 100 MCG tablet Take 100 mcg by mouth daily before breakfast.     . metFORMIN (GLUCOPHAGE) 500 MG tablet Take 1 tablet (500 mg total) by mouth 2 (two) times daily with a meal. 60 tablet 1  . metoprolol tartrate (LOPRESSOR) 25 MG tablet TAKE (1) TABLET BY MOUTH TWICE DAILY. 180 tablet 2  . Multiple Vitamin (MULITIVITAMIN WITH MINERALS) TABS Take 1 tablet by mouth daily.    . pantoprazole (PROTONIX) 40 MG tablet TAKE 1 TABLET 30 TO 60 MINUTES BEFORE FIRST MEAL OF THE DAY. 30 tablet 5  . potassium chloride SA (K-DUR,KLOR-CON) 20 MEQ tablet Take 20 mEq by mouth daily.    Marland Kitchen warfarin (COUMADIN) 2.5 MG tablet TAKE AS DIRECTED BY COUMADIN CLINIC. 45 tablet 6   No current facility-administered medications for this visit.     Past Surgical History:  Procedure Laterality Date  . AORTIC VALVE REPLACEMENT N/A 07/18/2013   Procedure: AORTIC VALVE REPLACEMENT (AVR);  Surgeon: Gaye Pollack, MD;  Location: [facility];  Service: Open Heart Surgery;  Laterality: N/A;  . BACK SURGERY    . BREAST LUMPECTOMY Right 1995  . CARDIAC CATHETERIZATION    . CARDIOVERSION N/A 07/26/2013   Procedure: CARDIOVERSION;  Surgeon: Fay Records, MD;  Location: [facility] ENDOSCOPY;  Service: Cardiovascular;  Laterality: N/A;  . COLONOSCOPY  2012   Negative screening procedure  . DILATION AND CURETTAGE OF UTERUS    . ESOPHAGEAL MANOMETRY N/A 06/17/2013   Procedure: ESOPHAGEAL MANOMETRY (EM);  Surgeon: Sable Feil, MD;  Location: [facility] ENDOSCOPY;  Service: Endoscopy;  Laterality: N/A;  . INTRAOPERATIVE TRANSESOPHAGEAL ECHOCARDIOGRAM N/A 07/18/2013   Procedure: INTRAOPERATIVE TRANSESOPHAGEAL ECHOCARDIOGRAM;  Surgeon: Gaye Pollack, MD;  Location: [place] OR;  Service: Open Heart Surgery;  Laterality:  N/A;  . LEFT AND RIGHT HEART CATHETERIZATION WITH CORONARY ANGIOGRAM N/A 07/10/2013   Procedure: LEFT AND RIGHT HEART CATHETERIZATION WITH CORONARY ANGIOGRAM;  Surgeon: Burnell Blanks, MD;  Location: [facility] CATH LAB;  Service: Cardiovascular;  Laterality: N/A;  . LUMBAR LAMINECTOMY/DECOMPRESSION MICRODISCECTOMY Right 05/13/2014   Procedure: LUMBAR LAMINECTOMY/DECOMPRESSION MICRODISCECTOMY 1 LEVEL  lumbar four/five;  Surgeon: Faythe Ghee, MD;  Location: [facility] NEURO ORS;  Service: Neurosurgery;  Laterality: Right;  . MITRAL VALVE REPLACEMENT N/A 07/18/2013   Procedure: MITRAL VALVE (MV) REPLACEMENT;  Surgeon: Gaye Pollack, MD;  Location: [place] OR;  Service: Open Heart Surgery;  Laterality: N/A;  . MITRAL  [place], closed mitral valvulotomy by finger fracture  . TEE WITHOUT CARDIOVERSION  01/24/2012   Procedure: TRANSESOPHAGEAL ECHOCARDIOGRAM (TEE);  Surgeon: Lelon Perla, MD;  Location[facility] ENDOSCOPY;  Service: Cardiovascular;  Laterality: N/A;  . TEE WITHOUT CARDIOVERSION N/A 07/11/2013   Procedure: TRANSESOPHAGEAL ECHOCARDIOGRAM (TEE);  Surgeon: Thayer Headings, MD;  Location: [place];  Service: Cardiovascular;  Laterality: N/A;  . TEE WITHOUT CARDIOVERSION N/A 07/26/2013   Procedure: TRANSESOPHAGEAL ECHOCARDIOGRAM (TEE);  Surgeon: Fay Records, MD;  Location: [place];  Service: Cardiovascular;  Laterality: N/A;  . TUBAL LIGATION  1973     Allergies  Allergen Reactions  . Amiodarone Rash      Family History  Problem Relation Age of Onset  . Heart disease Brother 61       MI  . Rheum arthritis Maternal Grandmother   . Asthma Maternal Grandfather   . Hypothyroidism Mother   . Diabetes Sister   . Cirrhosis Father      Social History [name] reports that she has never smoked. She has never used smokeless tobacco. [name] reports no history of alcohol use.   Review of Systems CONSTITUTIONAL: No weight loss, fever, chills, weakness or  fatigue.  HEENT: Eyes: No visual loss, blurred vision, double vision or yellow sclerae.No hearing loss, sneezing, congestion, runny nose or sore throat.  SKIN: No rash or itching.  CARDIOVASCULAR: per hp RESPIRATORY: per hpi GASTROINTESTINAL: No anorexia, nausea, vomiting or diarrhea. No abdominal pain or blood.  GENITOURINARY: No burning on urination, no polyuria NEUROLOGICAL: No headache, dizziness, syncope, paralysis, ataxia, numbness or tingling in the extremities. No change in bowel or bladder control.  MUSCULOSKELETAL: No muscle, back pain, joint pain or stiffness.  LYMPHATICS: No enlarged nodes. No history of splenectomy.  PSYCHIATRIC: No history of depression or anxiety.  ENDOCRINOLOGIC: No reports of sweating, cold or heat intolerance. No polyuria or polydipsia.  Marland Kitchen   Physical Examination Today's Vitals   01/14/21 0830  BP: 114/76  Pulse: 96  SpO2: 98%  Weight: 177 lb 3.2 oz (80.4 kg)  Height: 5' 2.5" (1.588 m)   Body mass index is 31.89 kg/m.  Gen: resting comfortably, no acute distress HEENT: no scleral icterus, pupils equal round and reactive, no palptable cervical adenopathy,  CV: irreg, no m/rg, no jvd Resp: Clear to auscultation bilaterally GI: abdomen is soft, non-tender, non-distended, normal bowel sounds, no hepatosplenomegaly MSK: extremities are warm, no edema.  Skin: warm, no rash Neuro:  no focal deficits Psych: appropriate affect   Diagnostic Studies  07/2013 TTE Study Conclusions  - Study data: Technically difficult study. - Left ventricle: The cavity size was normal. Wall thickness was normal. Systolic function was normal. The estimated ejection fraction was in the range of 55% to 60%. Indeterminate diastolic function. There is evidence of very high left atrial pressures (E/e' 63) - Aortic valve: Trileaflet; mildly thickened leaflets. Accurate evaluation of aortic stenosis limited in the setting of severe mitral stenosis and  decreased ventricular loadin. Morphologically there does not appear to be significant stenosis. By gradient (mean PG 21 mmHg) the stenosis is moderate, by AVA VTI (0.8) stenosis is severe. Moderate regurgitation, AR PHT is 439 cm/s. Valve area: 0.78cm^2(VTI). Valve area: 0.78cm^2 (Vmax). - Mitral valve: The findings are consistent with moderate to severe stenosis. (Mean PG 12 mmHg, MVA by PHT 1.3. Cannot use MVA by VTI due to moderate AI. Valve area by pressure half-time: 1.38cm^2. - Left atrium: The atrium was  severely dilated. - Right ventricle: Systolic function was mildly reduced, RV TAPSE is 1.4, TV systolic annular tissue velocity 10 cm/s. - Pulmonary arteries: Systolic pressure was moderately increased. PA peak pressure: 53mm Hg (S) (PASP based on a fairly faint spectral Doppler waveform)   07/2013 TEE Study Conclusions  - Left ventricle: Systolic function was moderately reduced. The estimated ejection fraction was in the range of 35% to 40%. - Aortic valve: There was mild to moderate stenosis. Mild to moderate regurgitation. - Mitral valve: The findings are consistent with moderate to severe stenosis. Valve area by pressure half-time: 1.45cm^2. - Left atrium: There was spontaneous echo contrast ("smoke"). - Atrial septum: No defect or patent foramen ovale was identified. - Tricuspid valve: No evidence of vegetation. Transesophageal echocardiography. 2D and color Doppler. Height: Height: 157.5cm. Height: 62in. Weight: Weight: 75kg. Weight: 165lb. Body mass index: BMI: 30.2kg/m^2. Body surface area:  BSA: 1.20m^2. Blood pressure: 143/56. Patient status: Inpatient. Location: Endoscopy.   06/2018 echo Study Conclusions  - Left ventricle: The cavity size was normal. Wall thickness was normal. Systolic function was normal. The estimated ejection fraction was in the range of 60% to 65%. - Aortic valve: AV  prosthesis is difficult to see Peak and mean gradients through the valve are 18 and 10 mm Hg respectively. - Mitral valve: Peak and mean gradients through the valve prosthesis are 19 and 4 mm Hg respectively MVA by P T1/2 is 3 cm2. s/p MV prosthesis. Valve prosthesis is difficult to see. - Pulmonary arteries: PA peak pressure: 48 mm Hg (S).   04/2020 echo IMPRESSIONS    1. Left ventricular ejection fraction, by estimation, is 60 to 65%. The  left ventricle has normal function. The left ventricle has no regional  wall motion abnormalities. Left ventricular diastolic parameters are  indeterminate.  2. Right ventricular systolic function is low normal. The right  ventricular size is normal. There is moderately elevated pulmonary artery  systolic pressure. The estimated right ventricular systolic pressure is  10.1 mmHg.  3. Left atrial size was mildly dilated.  4. The mitral valve has been repaired/replaced. No evidence of mitral  valve regurgitation. The mean mitral valve gradient is 5.0 mmHg. There is  a 25 mm Magna Ease bioprosthetic valve present in the mitral position.  Grossly normal function.  5. The aortic valve has been repaired/replaced. Aortic valve  regurgitation is not visualized. There is a 21 mm Magna Ease bioprosthetic  valve present in the aortic position. Aortic valve mean gradient measures  7.0 mmHg. Grossly normal function.  6. The inferior vena cava is normal in size with greater than 50%  respiratory variability, suggesting right atrial pressure of 3 mmHg.   07/2013 Cath Impression: 1. No angiographic evidence of CAD 2. Elevated filling pressures    Assessment and Plan   1. Valvular heart disease  - continues to do well, most recent echo showed normal valve function - continue to monitor.    2. Afib  - hx of prior cardioembolic CVA, has been continued on coumadin. - no palpitations - EKG today shows rate controlled afib  3.  Hyperlipidemia -request pcp labs continue statin  4. Chest pain - exerternional chest pressure and SOB - order lexiscan  5. Carotid stenosis - due for carotid US, will order   F/u 6 months   Arnoldo Lenis, M.D

## 2021-01-28 ENCOUNTER — Encounter (HOSPITAL_BASED_OUTPATIENT_CLINIC_OR_DEPARTMENT_OTHER)
Admission: RE | Admit: 2021-01-28 | Discharge: 2021-01-28 | Disposition: A | Discharge status: Home / Self Care | Payer: Medicare PPO | Source: Ambulatory Visit | Attending: Cardiology | Admitting: Cardiology

## 2021-01-28 ENCOUNTER — Encounter (HOSPITAL_COMMUNITY): Payer: Self-pay

## 2021-01-28 ENCOUNTER — Encounter (HOSPITAL_COMMUNITY)
Admission: RE | Admit: 2021-01-28 | Discharge: 2021-01-28 | Disposition: A | Discharge status: Home / Self Care | Payer: Medicare PPO | Source: Ambulatory Visit | Attending: Cardiology | Admitting: Cardiology

## 2021-01-28 DIAGNOSIS — R079 Chest pain, unspecified: Secondary | ICD-10-CM | POA: Insufficient documentation

## 2021-01-28 HISTORY — DX: Type 2 diabetes mellitus without complications: E11.9

## 2021-01-28 HISTORY — DX: Unspecified asthma, uncomplicated: J45.909

## 2021-01-28 LAB — NM MYOCAR MULTI W/SPECT W/WALL MOTION / EF
LV dias vol: 61 mL (ref 46–106)
LV sys vol: 30 mL
Peak HR: 90 {beats}/min
RATE: 0.4
Rest HR: 69 {beats}/min
SDS: 5
SRS: 5
SSS: 10
TID: 1.02

## 2021-01-28 MED ORDER — REGADENOSON 0.4 MG/5ML IV SOLN
INTRAVENOUS | Status: AC
  Administered 2021-01-28: 0.4 mg via INTRAVENOUS
  Filled 2021-01-28: qty 5

## 2021-01-28 MED ORDER — SODIUM CHLORIDE FLUSH 0.9 % IV SOLN
INTRAVENOUS | Status: AC
  Administered 2021-01-28: 10 mL via INTRAVENOUS
  Filled 2021-01-28: qty 10

## 2021-01-28 MED ORDER — TECHNETIUM TC 99M TETROFOSMIN IV KIT
10.0000 | PACK | Freq: Once | INTRAVENOUS | Status: AC | PRN
  Administered 2021-01-28: 11 via INTRAVENOUS

## 2021-01-28 MED ORDER — TECHNETIUM TC 99M TETROFOSMIN IV KIT
30.0000 | PACK | Freq: Once | INTRAVENOUS | Status: AC | PRN
  Administered 2021-01-28: 33 via INTRAVENOUS

## 2021-02-02 ENCOUNTER — Telehealth: Payer: Self-pay | Admitting: *Deleted

## 2021-02-02 NOTE — Telephone Encounter (Signed)
-----   Message from Arnoldo Lenis, MD sent at 02/01/2021 12:20 PM EDT ----- Stress test overall looks good, no signs of any significant blockages of the heart   Zandra Abts MD

## 2021-02-02 NOTE — Telephone Encounter (Signed)
Pt voiced understanding

## 2021-02-03 ENCOUNTER — Ambulatory Visit (INDEPENDENT_AMBULATORY_CARE_PROVIDER_SITE_OTHER): Payer: Medicare PPO | Admitting: *Deleted

## 2021-02-03 DIAGNOSIS — I4891 Unspecified atrial fibrillation: Secondary | ICD-10-CM | POA: Diagnosis not present

## 2021-02-03 DIAGNOSIS — Z5181 Encounter for therapeutic drug level monitoring: Secondary | ICD-10-CM

## 2021-02-03 DIAGNOSIS — I639 Cerebral infarction, unspecified: Secondary | ICD-10-CM

## 2021-02-03 DIAGNOSIS — Z952 Presence of prosthetic heart valve: Secondary | ICD-10-CM | POA: Diagnosis not present

## 2021-02-03 LAB — POCT INR: INR: 3.2 — AB (ref 2.0–3.0)

## 2021-02-03 NOTE — Patient Instructions (Signed)
Continue warfarin 1 tablet daily except 1 1/2 tablets on Sundays, Tuesdays and Thursdays Continue greens Recheck in 4 wk On Protonix in am and Pepcid in pm for reflux 

## 2021-02-18 ENCOUNTER — Ambulatory Visit (INDEPENDENT_AMBULATORY_CARE_PROVIDER_SITE_OTHER): Payer: Medicare PPO

## 2021-02-18 ENCOUNTER — Telehealth: Payer: Self-pay

## 2021-02-18 DIAGNOSIS — I6523 Occlusion and stenosis of bilateral carotid arteries: Secondary | ICD-10-CM | POA: Diagnosis not present

## 2021-02-18 NOTE — Telephone Encounter (Signed)
   [facility] Pre-operative Risk Assessment    Patient Name: [name]  DOB: September 05, 1946  MRN: [id number]   HEARTCARE STAFF: - Please ensure there is not already an duplicate clearance open for this procedure. - Under Visit Info/Reason for Call, type in Other and utilize the format Clearance MM/DD/YY or Clearance TBD. Do not use dashes or single digits. - If request is for dental extraction, please clarify the # of teeth to be extracted.  Request for surgical clearance:  1. What type of surgery is being performed? COLONOSCOPY   2. When is this surgery scheduled? TBD   3. What type of clearance is required (medical clearance vs. Pharmacy clearance to hold med vs. Both)? BOTH  4. Are there any medications that need to be held prior to surgery and how long?WARFARIN 5 DAYS   5. Practice name and name of physician performing surgery? DR. Bernadene Person [facility]   6. What is the office phone number? [phone]   7.   What is the office fax number? [phone]  8.   Anesthesia type (None, local, MAC, general) ? MAC   Jacinta Shoe 02/18/2021, 5:08 PM  _________________________________________________________________   (provider comments below)

## 2021-02-19 ENCOUNTER — Telehealth (INDEPENDENT_AMBULATORY_CARE_PROVIDER_SITE_OTHER): Payer: Self-pay

## 2021-02-19 NOTE — Telephone Encounter (Signed)
Referring MD/PCP: [facility]  Procedure: Tcs  Reason/Indication:  Screening  Has patient had this procedure before?  yes  If so, when, by whom and where?  01/2011  Is there a family history of colon cancer?  no  Who?  What age when diagnosed?    Is patient diabetic? If yes, Type 1 or Type 2   Yes Type 2      Does patient have prosthetic heart valve or mechanical valve?  yes  Do you have a pacemaker/defibrillator?  no  Has patient ever had endocarditis/atrial fibrillation? yes  Have you had a stroke/heart attack last 6 mths? no  Does patient use oxygen? no  Has patient had joint replacement within last 12 months?  no  Is patient constipated or do they take laxatives? no  Does patient have a history of alcohol/drug use?  no  Is patient on blood thinner such as Coumadin, Plavix and/or Aspirin? yes  Do you take medicine for weight loss?  no  For female patients,: do you still have your menstrual cycle? no  Medications: Warfarin 25 mg daily (CVD [place]) Benicar 5 mg daily, Albuterol bid prn, lipitor40mg  daily, Diltiazem 180 mg daily, Lexapro 10 mg daily, Pepcid 20 mg daily, allegra 30 mg daily, Flovent 40 mcg 2 puffs bid, flonase, Lasix 20 mg daily, synthorid 100 mcg daily, metformin 500 mg bid, Lopressor 25 mg bid, mvi daily, protonix 40 mg daily, potassium 20 meq daily  Allergies: Amiodarone  Medication Adjustment per Dr Rehman/Dr Jenetta Downer Hold Warfarin 5 days prior to procedure, No Metformin the evening prior or the morning of procedue   Procedure date & time: waiting for cardiac clearance

## 2021-02-19 NOTE — Telephone Encounter (Signed)
   Primary Cardiologist: Carlyle Dolly, MD  Chart reviewed as part of pre-operative protocol coverage. Given past medical history and time since last visit, based on ACC/AHA guidelines, [name] would be at acceptable risk for the planned procedure without further cardiovascular testing.   Patient with diagnosis of A Fib with AVR and MVR on warfarin for anticoagulation.    Procedure: colonoscopy Date of procedure: TBD   CHA2DS2-VASc Score = 9  This indicates a 12.2% annual risk of stroke. The patient's score is based upon: CHF History: Yes HTN History: Yes Diabetes History: Yes Stroke History: Yes Vascular Disease History: Yes Age Score: 2 Gender Score: 1    CrCl 47 mL/min using adjusted body weight Platelet count 201K  Patient does require pre-op antibiotics for dental procedure.  Per office protocol, patient can hold warfarin for 5 days prior to procedure.    Patient WILL need bridging with Lovenox (enoxaparin) around procedure.  Next INR scheduled for 03/03/21, will route to Edrick Oh RN in [place] to coordinate bridge  I will route this recommendation to the requesting party via [facility] fax function and remove from pre-op pool.  Please call with questions.  Jossie Ng. Ariam Mol NP-C    02/19/2021, 8:22 AM [place] [address] Office [phone] Fax [phone]

## 2021-02-19 NOTE — Telephone Encounter (Signed)
Patient with diagnosis of A Fib with AVR and MVR on warfarin for anticoagulation.    Procedure: colonoscopy Date of procedure: TBD   CHA2DS2-VASc Score = 9  This indicates a 12.2% annual risk of stroke. The patient's score is based upon: CHF History: Yes HTN History: Yes Diabetes History: Yes Stroke History: Yes Vascular Disease History: Yes Age Score: 2 Gender Score: 1    CrCl 47 mL/min using adjusted body weight Platelet count 201K  Patient does require pre-op antibiotics for dental procedure.  Per office protocol, patient can hold warfarin for 5 days prior to procedure.    Patient WILL need bridging with Lovenox (enoxaparin) around procedure.  Next INR scheduled for 03/03/21, will route to Edrick Oh RN in [place] to coordinate bridge

## 2021-03-02 ENCOUNTER — Telehealth: Payer: Self-pay | Admitting: *Deleted

## 2021-03-02 NOTE — Telephone Encounter (Signed)
Pt aware.

## 2021-03-02 NOTE — Telephone Encounter (Signed)
-----   Message from Arnoldo Lenis, MD sent at 03/02/2021 12:51 PM EDT ----- Carotid US shows just mild blockages on both sides, continue to monitor at this time  J BrancH MD

## 2021-03-03 ENCOUNTER — Ambulatory Visit (INDEPENDENT_AMBULATORY_CARE_PROVIDER_SITE_OTHER): Payer: Medicare PPO | Admitting: *Deleted

## 2021-03-03 DIAGNOSIS — Z952 Presence of prosthetic heart valve: Secondary | ICD-10-CM

## 2021-03-03 DIAGNOSIS — I639 Cerebral infarction, unspecified: Secondary | ICD-10-CM | POA: Diagnosis not present

## 2021-03-03 DIAGNOSIS — Z5181 Encounter for therapeutic drug level monitoring: Secondary | ICD-10-CM

## 2021-03-03 DIAGNOSIS — I4891 Unspecified atrial fibrillation: Secondary | ICD-10-CM

## 2021-03-03 LAB — POCT INR: INR: 2.5 (ref 2.0–3.0)

## 2021-03-03 NOTE — Patient Instructions (Signed)
Continue warfarin 1 tablet daily except 1 1/2 tablets on Sundays, Tuesdays and Thursdays Continue greens Recheck in 4 wk Pending colonoscopy.  Date TBD Will hold warfarin and bridge with Lovenox

## 2021-03-08 ENCOUNTER — Other Ambulatory Visit (HOSPITAL_COMMUNITY): Payer: Self-pay | Admitting: Internal Medicine

## 2021-03-08 DIAGNOSIS — Z1231 Encounter for screening mammogram for malignant neoplasm of breast: Secondary | ICD-10-CM

## 2021-03-10 ENCOUNTER — Other Ambulatory Visit: Payer: Self-pay | Admitting: Internal Medicine

## 2021-03-10 DIAGNOSIS — R0609 Other forms of dyspnea: Secondary | ICD-10-CM

## 2021-03-11 ENCOUNTER — Ambulatory Visit (HOSPITAL_COMMUNITY)
Admission: RE | Admit: 2021-03-11 | Discharge: 2021-03-11 | Disposition: A | Discharge status: Home / Self Care | Payer: Medicare PPO | Source: Ambulatory Visit | Attending: Internal Medicine | Admitting: Internal Medicine

## 2021-03-11 DIAGNOSIS — Z1231 Encounter for screening mammogram for malignant neoplasm of breast: Secondary | ICD-10-CM | POA: Insufficient documentation

## 2021-03-18 ENCOUNTER — Ambulatory Visit (HOSPITAL_COMMUNITY)
Admission: RE | Admit: 2021-03-18 | Discharge: 2021-03-18 | Disposition: A | Discharge status: Home / Self Care | Payer: Medicare PPO | Source: Ambulatory Visit | Attending: Internal Medicine | Admitting: Internal Medicine

## 2021-03-18 DIAGNOSIS — J439 Emphysema, unspecified: Secondary | ICD-10-CM | POA: Diagnosis not present

## 2021-03-18 DIAGNOSIS — I2510 Atherosclerotic heart disease of native coronary artery without angina pectoris: Secondary | ICD-10-CM | POA: Diagnosis not present

## 2021-03-18 DIAGNOSIS — J9819 Other pulmonary collapse: Secondary | ICD-10-CM | POA: Insufficient documentation

## 2021-03-18 DIAGNOSIS — J9811 Atelectasis: Secondary | ICD-10-CM | POA: Diagnosis not present

## 2021-03-18 DIAGNOSIS — J90 Pleural effusion, not elsewhere classified: Secondary | ICD-10-CM | POA: Diagnosis not present

## 2021-03-22 NOTE — Progress Notes (Signed)
Tried calling pt, no answer and vm full. WCB once more and then mail letter.

## 2021-03-23 NOTE — Progress Notes (Signed)
Tried calling the pt and there was no answer and vm full. Will call back.

## 2021-03-31 ENCOUNTER — Ambulatory Visit (INDEPENDENT_AMBULATORY_CARE_PROVIDER_SITE_OTHER): Payer: Medicare PPO | Admitting: *Deleted

## 2021-03-31 DIAGNOSIS — I4891 Unspecified atrial fibrillation: Secondary | ICD-10-CM

## 2021-03-31 DIAGNOSIS — Z952 Presence of prosthetic heart valve: Secondary | ICD-10-CM

## 2021-03-31 DIAGNOSIS — Z5181 Encounter for therapeutic drug level monitoring: Secondary | ICD-10-CM

## 2021-03-31 DIAGNOSIS — I639 Cerebral infarction, unspecified: Secondary | ICD-10-CM

## 2021-03-31 LAB — POCT INR: INR: 2.3 (ref 2.0–3.0)

## 2021-03-31 NOTE — Patient Instructions (Signed)
Take warfarin 2 tablets today then resume 1 tablet daily except 1 1/2 tablets on Sundays, Tuesdays and Thursdays Continue greens Recheck in 4 wk Pending colonoscopy.  Date TBD

## 2021-04-01 ENCOUNTER — Other Ambulatory Visit: Payer: Self-pay

## 2021-04-01 DIAGNOSIS — J45991 Cough variant asthma: Secondary | ICD-10-CM

## 2021-04-06 DIAGNOSIS — E039 Hypothyroidism, unspecified: Secondary | ICD-10-CM | POA: Diagnosis not present

## 2021-04-06 DIAGNOSIS — Z79899 Other long term (current) drug therapy: Secondary | ICD-10-CM | POA: Diagnosis not present

## 2021-04-06 DIAGNOSIS — I480 Paroxysmal atrial fibrillation: Secondary | ICD-10-CM | POA: Diagnosis not present

## 2021-04-06 DIAGNOSIS — R413 Other amnesia: Secondary | ICD-10-CM | POA: Diagnosis not present

## 2021-04-06 DIAGNOSIS — I5032 Chronic diastolic (congestive) heart failure: Secondary | ICD-10-CM | POA: Diagnosis not present

## 2021-04-06 DIAGNOSIS — E1129 Type 2 diabetes mellitus with other diabetic kidney complication: Secondary | ICD-10-CM | POA: Diagnosis not present

## 2021-04-06 DIAGNOSIS — E785 Hyperlipidemia, unspecified: Secondary | ICD-10-CM | POA: Diagnosis not present

## 2021-04-06 DIAGNOSIS — N1831 Chronic kidney disease, stage 3a: Secondary | ICD-10-CM | POA: Diagnosis not present

## 2021-04-12 DIAGNOSIS — I5032 Chronic diastolic (congestive) heart failure: Secondary | ICD-10-CM | POA: Diagnosis not present

## 2021-04-12 DIAGNOSIS — R7309 Other abnormal glucose: Secondary | ICD-10-CM | POA: Diagnosis not present

## 2021-04-12 DIAGNOSIS — E1122 Type 2 diabetes mellitus with diabetic chronic kidney disease: Secondary | ICD-10-CM | POA: Diagnosis not present

## 2021-04-12 DIAGNOSIS — N1831 Chronic kidney disease, stage 3a: Secondary | ICD-10-CM | POA: Diagnosis not present

## 2021-04-21 ENCOUNTER — Other Ambulatory Visit: Payer: Self-pay | Admitting: Cardiology

## 2021-04-28 ENCOUNTER — Ambulatory Visit (INDEPENDENT_AMBULATORY_CARE_PROVIDER_SITE_OTHER): Payer: Medicare PPO | Admitting: *Deleted

## 2021-04-28 ENCOUNTER — Other Ambulatory Visit: Payer: Self-pay

## 2021-04-28 DIAGNOSIS — Z5181 Encounter for therapeutic drug level monitoring: Secondary | ICD-10-CM | POA: Diagnosis not present

## 2021-04-28 DIAGNOSIS — I4891 Unspecified atrial fibrillation: Secondary | ICD-10-CM | POA: Diagnosis not present

## 2021-04-28 DIAGNOSIS — I639 Cerebral infarction, unspecified: Secondary | ICD-10-CM | POA: Diagnosis not present

## 2021-04-28 DIAGNOSIS — Z952 Presence of prosthetic heart valve: Secondary | ICD-10-CM

## 2021-04-28 LAB — POCT INR: INR: 2.9 (ref 2.0–3.0)

## 2021-04-28 NOTE — Patient Instructions (Signed)
Continue warfarin 1 tablet daily except 1 1/2 tablets on Sundays, Tuesdays and Thursdays Continue greens Recheck in 4 wk Pending colonoscopy.  Date TBD Will hold warfarin and bridge with Lovenox

## 2021-05-05 ENCOUNTER — Other Ambulatory Visit: Payer: Self-pay | Admitting: Cardiology

## 2021-05-26 ENCOUNTER — Ambulatory Visit (INDEPENDENT_AMBULATORY_CARE_PROVIDER_SITE_OTHER): Payer: Medicare PPO

## 2021-05-26 ENCOUNTER — Encounter: Payer: Self-pay | Admitting: Emergency Medicine

## 2021-05-26 ENCOUNTER — Other Ambulatory Visit: Payer: Self-pay

## 2021-05-26 ENCOUNTER — Ambulatory Visit
Admission: EM | Admit: 2021-05-26 | Discharge: 2021-05-26 | Disposition: A | Discharge status: Home / Self Care | Payer: Medicare PPO | Attending: Physician Assistant | Admitting: Physician Assistant

## 2021-05-26 ENCOUNTER — Telehealth: Payer: Self-pay | Admitting: *Deleted

## 2021-05-26 DIAGNOSIS — I517 Cardiomegaly: Secondary | ICD-10-CM | POA: Diagnosis not present

## 2021-05-26 DIAGNOSIS — R0602 Shortness of breath: Secondary | ICD-10-CM

## 2021-05-26 DIAGNOSIS — J9601 Acute respiratory failure with hypoxia: Secondary | ICD-10-CM

## 2021-05-26 DIAGNOSIS — R079 Chest pain, unspecified: Secondary | ICD-10-CM | POA: Diagnosis not present

## 2021-05-26 DIAGNOSIS — R058 Other specified cough: Secondary | ICD-10-CM | POA: Diagnosis not present

## 2021-05-26 DIAGNOSIS — J90 Pleural effusion, not elsewhere classified: Secondary | ICD-10-CM | POA: Diagnosis not present

## 2021-05-26 MED ORDER — PREDNISONE 50 MG PO TABS: ORAL_TABLET | ORAL | 0 refills | Status: DC

## 2021-05-26 MED ORDER — DOXYCYCLINE HYCLATE 100 MG PO CAPS: 100.0000 mg | ORAL_CAPSULE | Freq: Two times a day (BID) | ORAL | 0 refills | Status: DC

## 2021-05-26 MED ORDER — ALBUTEROL SULFATE HFA 108 (90 BASE) MCG/ACT IN AERS
2.0000 | INHALATION_SPRAY | Freq: Once | RESPIRATORY_TRACT | Status: AC
  Administered 2021-05-26: 2 via RESPIRATORY_TRACT

## 2021-05-26 NOTE — Discharge Instructions (Addendum)
Return if any problems.  Go to the Emergency department if symptoms worsen or change Your covid and flu test are pending

## 2021-05-26 NOTE — Telephone Encounter (Signed)
Mikki Santee (husband) called to let Edrick Oh know he took his wife to Urgent Care. They put her on Doxycycline and Prednisone to help with her breathing because her oxygen level was low. She currently takes coumadin has appt Monday concerned it may affect her Coumadin Level [phone].

## 2021-05-26 NOTE — ED Triage Notes (Signed)
Shortness of breath and CP in center of chest that started around 7pm yesterday evening.

## 2021-05-26 NOTE — Telephone Encounter (Addendum)
Returned a call to the pt; she was advised that Doxy and prednisone both interact with warfarin, therefore, we need to check her INR this week. She verbalized understanding and is aware that she should have some leafy veggies while taken these new meds. The doxy is '100mg'$  BID for 10 days and the Prednisone is '50mg'$  for 6 days; she is aware these are large doses of prednisone but have been prescribed for her issue and we want her to take them but have to check her INR. She was agreeable to come in for the appt on Thursday at 3pm in the [place] office.   Will check pt before the weekend since on a large dose of prednisone. Will not remove appt that is set for Monday as we are checking INR tomorrow only after receiving the meds today.

## 2021-05-27 ENCOUNTER — Other Ambulatory Visit: Payer: Self-pay

## 2021-05-27 ENCOUNTER — Ambulatory Visit (INDEPENDENT_AMBULATORY_CARE_PROVIDER_SITE_OTHER): Payer: Medicare PPO | Admitting: *Deleted

## 2021-05-27 ENCOUNTER — Emergency Department (HOSPITAL_COMMUNITY)
Admission: EM | Admit: 2021-05-27 | Discharge: 2021-05-27 | Disposition: A | Discharge status: Home / Self Care | Payer: Medicare PPO | Attending: Emergency Medicine | Admitting: Emergency Medicine

## 2021-05-27 ENCOUNTER — Other Ambulatory Visit (HOSPITAL_COMMUNITY): Payer: Self-pay

## 2021-05-27 DIAGNOSIS — R000 Tachycardia, unspecified: Secondary | ICD-10-CM | POA: Diagnosis not present

## 2021-05-27 DIAGNOSIS — Z79899 Other long term (current) drug therapy: Secondary | ICD-10-CM | POA: Diagnosis not present

## 2021-05-27 DIAGNOSIS — Z20822 Contact with and (suspected) exposure to covid-19: Secondary | ICD-10-CM | POA: Diagnosis not present

## 2021-05-27 DIAGNOSIS — I509 Heart failure, unspecified: Secondary | ICD-10-CM | POA: Diagnosis not present

## 2021-05-27 DIAGNOSIS — I130 Hypertensive heart and chronic kidney disease with heart failure and stage 1 through stage 4 chronic kidney disease, or unspecified chronic kidney disease: Secondary | ICD-10-CM | POA: Insufficient documentation

## 2021-05-27 DIAGNOSIS — R0989 Other specified symptoms and signs involving the circulatory and respiratory systems: Secondary | ICD-10-CM | POA: Diagnosis present

## 2021-05-27 DIAGNOSIS — Z7901 Long term (current) use of anticoagulants: Secondary | ICD-10-CM | POA: Insufficient documentation

## 2021-05-27 DIAGNOSIS — D631 Anemia in chronic kidney disease: Secondary | ICD-10-CM | POA: Insufficient documentation

## 2021-05-27 DIAGNOSIS — E1122 Type 2 diabetes mellitus with diabetic chronic kidney disease: Secondary | ICD-10-CM | POA: Diagnosis not present

## 2021-05-27 DIAGNOSIS — Z853 Personal history of malignant neoplasm of breast: Secondary | ICD-10-CM | POA: Diagnosis not present

## 2021-05-27 DIAGNOSIS — I4891 Unspecified atrial fibrillation: Secondary | ICD-10-CM

## 2021-05-27 DIAGNOSIS — E039 Hypothyroidism, unspecified: Secondary | ICD-10-CM | POA: Diagnosis not present

## 2021-05-27 DIAGNOSIS — J441 Chronic obstructive pulmonary disease with (acute) exacerbation: Secondary | ICD-10-CM | POA: Insufficient documentation

## 2021-05-27 DIAGNOSIS — J45909 Unspecified asthma, uncomplicated: Secondary | ICD-10-CM | POA: Insufficient documentation

## 2021-05-27 DIAGNOSIS — Z5181 Encounter for therapeutic drug level monitoring: Secondary | ICD-10-CM

## 2021-05-27 DIAGNOSIS — I639 Cerebral infarction, unspecified: Secondary | ICD-10-CM | POA: Diagnosis not present

## 2021-05-27 DIAGNOSIS — N182 Chronic kidney disease, stage 2 (mild): Secondary | ICD-10-CM | POA: Insufficient documentation

## 2021-05-27 LAB — BASIC METABOLIC PANEL
Anion gap: 10 (ref 5–15)
BUN: 10 mg/dL (ref 8–23)
CO2: 25 mmol/L (ref 22–32)
Calcium: 8.7 mg/dL — ABNORMAL LOW (ref 8.9–10.3)
Chloride: 103 mmol/L (ref 98–111)
Creatinine, Ser: 1.04 mg/dL — ABNORMAL HIGH (ref 0.44–1.00)
GFR, Estimated: 55 mL/min — ABNORMAL LOW (ref >60)
Glucose, Bld: 116 mg/dL — ABNORMAL HIGH (ref 70–99)
Potassium: 3.9 mmol/L (ref 3.5–5.1)
Sodium: 138 mmol/L (ref 135–145)

## 2021-05-27 LAB — RESP PANEL BY RT-PCR (FLU A&B, COVID) ARPGX2
Influenza A by PCR: NEGATIVE
Influenza B by PCR: NEGATIVE
SARS Coronavirus 2 by RT PCR: NEGATIVE

## 2021-05-27 LAB — POCT INR: INR: 2.6 (ref 2.0–3.0)

## 2021-05-27 LAB — CBC WITH DIFFERENTIAL/PLATELET
Abs Immature Granulocytes: 0.03 10*3/uL (ref 0.00–0.07)
Basophils Absolute: 0.1 10*3/uL (ref 0.0–0.1)
Basophils Relative: 1 %
Eosinophils Absolute: 0.1 10*3/uL (ref 0.0–0.5)
Eosinophils Relative: 3 %
HCT: 40.4 % (ref 36.0–46.0)
Hemoglobin: 12.6 g/dL (ref 12.0–15.0)
Immature Granulocytes: 1 %
Lymphocytes Relative: 13 %
Lymphs Abs: 0.7 10*3/uL (ref 0.7–4.0)
MCH: 29.6 pg (ref 26.0–34.0)
MCHC: 31.2 g/dL (ref 30.0–36.0)
MCV: 95.1 fL (ref 80.0–100.0)
Monocytes Absolute: 0.9 10*3/uL (ref 0.1–1.0)
Monocytes Relative: 16 %
Neutro Abs: 3.7 10*3/uL (ref 1.7–7.7)
Neutrophils Relative %: 66 %
Platelets: 147 10*3/uL — ABNORMAL LOW (ref 150–400)
RBC: 4.25 MIL/uL (ref 3.87–5.11)
RDW: 14.5 % (ref 11.5–15.5)
WBC: 5.5 10*3/uL (ref 4.0–10.5)
nRBC: 0 % (ref 0.0–0.2)

## 2021-05-27 LAB — COVID-19, FLU A+B NAA
Influenza A, NAA: NOT DETECTED
Influenza B, NAA: NOT DETECTED
SARS-CoV-2, NAA: NOT DETECTED

## 2021-05-27 MED ORDER — IPRATROPIUM-ALBUTEROL 0.5-2.5 (3) MG/3ML IN SOLN
3.0000 mL | Freq: Once | RESPIRATORY_TRACT | Status: AC
  Administered 2021-05-27: 3 mL via RESPIRATORY_TRACT
  Filled 2021-05-27: qty 3

## 2021-05-27 MED ORDER — ALBUTEROL SULFATE (2.5 MG/3ML) 0.083% IN NEBU
2.5000 mg | INHALATION_SOLUTION | Freq: Once | RESPIRATORY_TRACT | Status: AC
  Administered 2021-05-27: 2.5 mg via RESPIRATORY_TRACT
  Filled 2021-05-27: qty 3

## 2021-05-27 MED ORDER — METHYLPREDNISOLONE SODIUM SUCC 125 MG IJ SOLR
125.0000 mg | Freq: Once | INTRAMUSCULAR | Status: AC
  Administered 2021-05-27: 125 mg via INTRAVENOUS
  Filled 2021-05-27: qty 2

## 2021-05-27 NOTE — Patient Instructions (Addendum)
Description   While you are on the prednisone and doxy take warfarin 1 tablet daily except for 1.5 tablets on Tuesdays and Thursdays. Recheck INR in 5 days.   Per last encounter:  Pending colonoscopy.  Date TBD Will hold warfarin and bridge with Lovenox

## 2021-05-27 NOTE — ED Triage Notes (Addendum)
Pt c/o sob for 2 weeks. Hx of COPD. Was seen at urgent care yesterday, pt states she did not pick up any medications prescribed.   Pt also complaining of a cough that she states "has had forever".

## 2021-05-27 NOTE — ED Provider Notes (Addendum)
[facility] EMERGENCY DEPARTMENT Provider Note   CSN: [id number] Arrival date & time: 05/27/21  0258     History Chief Complaint  Patient presents with   Shortness of Breath    [name] is a 75 y.o. female.  Patient presents to the emergency department for evaluation of cough and chest congestion.  Patient does have a history of COPD.  She has been having symptoms for 2 weeks.  She reports that symptoms are worsening.  She was seen in urgent care yesterday, was prescribed doxycycline and prednisone but did not fill the prescriptions.  Overnight she became more tight in her chest with increased shortness of breath.  She reports that her husband had COVID 3 weeks ago.      Past Medical History:  Diagnosis Date   Anemia    Asthma    Atrial fibrillation ([place])    Breast carcinoma ([place]) 1995   [id number]   Cardioembolic stroke ([place]) [id number]   Right frontal in 01/2012; normal carotid ultrasound; possible LAA thrombus by TEE; virtual complete neurologic recovery   Chronic kidney disease, stage 2, mildly decreased GFR    GFR of approximately 60   Depression    Diabetes mellitus without complication ([place])    Diverticulosis of colon (without mention of hemorrhage) 2012   Dr. Laural Golden   Fasting hyperglycemia    120 fasting   Gastroesophageal reflux disease    Gastroparesis    Hemorrhoids    Hyperlipidemia    Hypertension    pt denies 05/30/13     Dr Kathe Mariner   Hyponatremia    Rheumatic heart disease    a. 07/2013 Echo: Ef 55-60%, Mod AS/AI, Mod-Sev MS, sev dil LA, PASP 58;  b. 07/2013 TEE: EF 35-40%, mild-mod AS/AI, mod-sev MS, LA smoke;  c. 07/2013 Cath: elev R heart pressures, nl cors.   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   Syncope 04/30/2020   Asthma exacerbation 11/12/2016   Acute CHF (congestive heart failure) ([place]) 11/12/2016   Synovial cyst of lumbar spine 05/13/2014    Encounter for therapeutic drug monitoring 11/06/2013   Heart valve replaced by other means 08/05/2013   Hyponatremia    Atrial fibrillation (HCC)    Moderate aortic stenosis 07/12/2013   Moderate aortic insufficiency 07/12/2013   Severe mitral valve stenosis 07/09/2013   Mitral stenosis 07/08/2013   Acute respiratory failure with hypoxia ([place]) 07/07/2013   Acute pulmonary edema ([place]) 07/07/2013   Pleural effusion, bilateral 07/07/2013   Chest pain 07/07/2013   Asthma, cough variant 03/27/2013   Upper airway cough syndrome in pt with mild cough variant asthma  09/17/2012   Dyspnea on exertion 06/13/2012   Edema 06/13/2012   Rheumatic heart disease 06/13/2012   Fasting hyperglycemia    Chronic anticoagulation 01/26/2012   Hypothyroidism [id number]   Cardioembolic stroke ([place]) [id number]   Hyperlipidemia    Essential hypertension    Breast carcinoma (HCC)    Gastroesophageal reflux disease     Past Surgical History:  Procedure Laterality Date   AORTIC VALVE REPLACEMENT N/A 07/18/2013   Procedure: AORTIC VALVE REPLACEMENT (AVR);  Surgeon: Gaye Pollack, MD;  Location: [place];  Service: Open Heart Surgery;  Laterality: N/A;   BACK SURGERY     BREAST LUMPECTOMY Right 1995   CARDIAC CATHETERIZATION     CARDIOVERSION N/A 07/26/2013   Procedure: CARDIOVERSION;  Surgeon: Nevin Bloodgood  Alcus Dad, MD;  Location: [facility] ENDOSCOPY;  Service: Cardiovascular;  Laterality: N/A;   COLONOSCOPY  2012   Negative screening procedure   DILATION AND CURETTAGE OF UTERUS     ESOPHAGEAL MANOMETRY N/A 06/17/2013   Procedure: ESOPHAGEAL MANOMETRY (EM);  Surgeon: Sable Feil, MD;  Location: [facility] ENDOSCOPY;  Service: Endoscopy;  Laterality: N/A;   INTRAOPERATIVE TRANSESOPHAGEAL ECHOCARDIOGRAM N/A 07/18/2013   Procedure: INTRAOPERATIVE TRANSESOPHAGEAL ECHOCARDIOGRAM;  Surgeon: Gaye Pollack, MD;  Location: [place] OR;  Service: Open Heart Surgery;  Laterality: N/A;   LEFT AND RIGHT HEART CATHETERIZATION WITH CORONARY  ANGIOGRAM N/A 07/10/2013   Procedure: LEFT AND RIGHT HEART CATHETERIZATION WITH CORONARY ANGIOGRAM;  Surgeon: Burnell Blanks, MD;  Location: [facility] CATH LAB;  Service: Cardiovascular;  Laterality: N/A;   LUMBAR LAMINECTOMY/DECOMPRESSION MICRODISCECTOMY Right 05/13/2014   Procedure: LUMBAR LAMINECTOMY/DECOMPRESSION MICRODISCECTOMY 1 LEVEL  lumbar four/five;  Surgeon: Faythe Ghee, MD;  Location: [facility] NEURO ORS;  Service: Neurosurgery;  Laterality: Right;   MITRAL VALVE REPLACEMENT N/A 07/18/2013   Procedure: MITRAL VALVE (MV) REPLACEMENT;  Surgeon: Gaye Pollack, MD;  Location: [place] OR;  Service: Open Heart Surgery;  Laterality: N/A;   MITRAL VALVE [place], closed mitral valvulotomy by finger fracture   TEE WITHOUT CARDIOVERSION  01/24/2012   Procedure: TRANSESOPHAGEAL ECHOCARDIOGRAM (TEE);  Surgeon: Lelon Perla, MD;  Location: [facility] ENDOSCOPY;  Service: Cardiovascular;  Laterality: N/A;   TEE WITHOUT CARDIOVERSION N/A 07/11/2013   Procedure: TRANSESOPHAGEAL ECHOCARDIOGRAM (TEE);  Surgeon: Thayer Headings, MD;  Location: [place];  Service: Cardiovascular;  Laterality: N/A;   TEE WITHOUT CARDIOVERSION N/A 07/26/2013   Procedure: TRANSESOPHAGEAL ECHOCARDIOGRAM (TEE);  Surgeon: Fay Records, MD;  Location: [facility] ENDOSCOPY;  Service: Cardiovascular;  Laterality: N/A;   TUBAL LIGATION  1973     OB History   No obstetric history on file.     Family History  Problem Relation Age of Onset   Heart disease Brother 55       MI   Rheum arthritis Maternal Grandmother    Asthma Maternal Grandfather    Hypothyroidism Mother    Diabetes Sister    Cirrhosis Father     Social History   Tobacco Use   Smoking status: Never   Smokeless tobacco: Never  Vaping Use   Vaping Use: Never used  Substance Use Topics   Alcohol use: No    Alcohol/week: 0.0 standard drinks   Drug use: No    Home Medications Prior to Admission medications   Medication Sig Start Date End Date Taking?  Authorizing Provider  albuterol (VENTOLIN HFA) 108 (90 Base) MCG/ACT inhaler Inhale 2 puffs into the lungs every 4 (four) hours as needed for wheezing or shortness of breath.     [provider]  atorvastatin (LIPITOR) 40 MG tablet TAKE ONE TABLET BY MOUTH ONCE DAILY. 12/09/20   Arnoldo Lenis, MD  dextromethorphan-guaiFENesin (MUCINEX DM) 30-600 MG 12hr tablet Take 1 tablet by mouth 2 (two) times daily.    [provider]  diltiazem (CARDIZEM CD) 180 MG 24 hr capsule Take 1 capsule (180 mg total) by mouth daily. 08/03/13   Collins, Arlyn Leak, PA-C  doxycycline (VIBRAMYCIN) 100 MG capsule Take 1 capsule (100 mg total) by mouth 2 (two) times daily. 05/26/21   Fransico Meadow, PA-C  escitalopram (LEXAPRO) 10 MG tablet Take 10 mg by mouth daily.  04/10/15   [provider]  famotidine (PEPCID) 20 MG tablet One after supper 06/23/20  Tanda Rockers, MD  fexofenadine (ALLEGRA) 30 MG tablet Take 30 mg by mouth daily.     [provider]  FLOVENT HFA 44 MCG/ACT inhaler Inhale 2 puffs into the lungs 2 (two) times daily. 12/16/20   [provider]  fluticasone (FLONASE) 50 MCG/ACT nasal spray Place 1 spray into both nostrils daily as needed for allergies.  09/05/13   Arnoldo Lenis, MD  furosemide (LASIX) 20 MG tablet TAKE (1) TABLET BY MOUTH ONCE DAILY FOR SWELLING. 09/01/20   Arnoldo Lenis, MD  gabapentin (NEURONTIN) 100 MG capsule Take 1 capsule (100 mg total) by mouth 4 (four) times daily. 09/16/20   Tanda Rockers, MD  levothyroxine (SYNTHROID) 100 MCG tablet Take 100 mcg by mouth daily before breakfast.  02/19/20   [provider]  metFORMIN (GLUCOPHAGE) 500 MG tablet Take 1 tablet (500 mg total) by mouth 2 (two) times daily with a meal. 08/03/13   Collins, Gina L, PA-C  metoprolol tartrate (LOPRESSOR) 25 MG tablet TAKE (1) TABLET BY MOUTH TWICE DAILY. 05/05/21   Arnoldo Lenis, MD  Multiple Vitamin (MULITIVITAMIN WITH MINERALS) TABS Take 1  tablet by mouth daily.    [provider]  olmesartan (BENICAR) 5 MG tablet Take 5 mg by mouth daily.    [provider]  pantoprazole (PROTONIX) 40 MG tablet TAKE 1 TABLET 30 TO 60 MINUTES BEFORE FIRST MEAL OF THE DAY. 09/09/20   Tanda Rockers, MD  potassium chloride SA (K-DUR,KLOR-CON) 20 MEQ tablet Take 20 mEq by mouth daily. 08/14/13   Gaye Pollack, MD  predniSONE (DELTASONE) 50 MG tablet One tablet a day 05/26/21   Fransico Meadow, PA-C  warfarin (COUMADIN) 2.5 MG tablet TAKE AS DIRECTED BY COUMADIN CLINIC. 04/21/21   Arnoldo Lenis, MD    Allergies    Amiodarone  Review of Systems   Review of Systems  Respiratory:  Positive for cough and chest tightness.   All other systems reviewed and are negative.  Physical Exam Updated Vital Signs BP 137/86   Pulse 100   Temp 98.4 F (36.9 C) (Oral)   Resp 19   Ht 5' 2.5" (1.588 m)   Wt 68 kg   SpO2 96%   BMI 27.00 kg/m   Physical Exam Vitals and nursing note reviewed.  Constitutional:      General: She is not in acute distress.    Appearance: Normal appearance. She is well-developed.  HENT:     Head: Normocephalic and atraumatic.     Right Ear: Hearing normal.     Left Ear: Hearing normal.     Nose: Nose normal.  Eyes:     Conjunctiva/sclera: Conjunctivae normal.     Pupils: Pupils are equal, round, and reactive to light.  Cardiovascular:     Rate and Rhythm: Regular rhythm.     Heart sounds: S1 normal and S2 normal. No murmur heard.   No friction rub. No gallop.  Pulmonary:     Effort: Pulmonary effort is normal. No respiratory distress.     Breath sounds: Decreased breath sounds and wheezing present.  Chest:     Chest wall: No tenderness.  Abdominal:     General: Bowel sounds are normal.     Palpations: Abdomen is soft.     Tenderness: There is no abdominal tenderness. There is no guarding or rebound. Negative signs include Murphy's sign and McBurney's sign.     Hernia: No hernia is present.   Musculoskeletal:  General: Normal range of motion.     Cervical back: Normal range of motion and neck supple.  Skin:    General: Skin is warm and dry.     Findings: No rash.  Neurological:     Mental Status: She is alert and oriented to person, place, and time.     GCS: GCS eye subscore is 4. GCS verbal subscore is 5. GCS motor subscore is 6.     Cranial Nerves: No cranial nerve deficit.     Sensory: No sensory deficit.     Coordination: Coordination normal.  Psychiatric:        Speech: Speech normal.        Behavior: Behavior normal.        Thought Content: Thought content normal.    ED Results / Procedures / Treatments   Labs (all labs ordered are listed, but only abnormal results are displayed) Labs Reviewed  CBC WITH DIFFERENTIAL/PLATELET - Abnormal; Notable for the following components:      Result Value   Platelets 147 (*)    All other components within normal limits  BASIC METABOLIC PANEL - Abnormal; Notable for the following components:   Glucose, Bld 116 (*)    Creatinine, Ser 1.04 (*)    Calcium 8.7 (*)    GFR, Estimated 55 (*)    All other components within normal limits  RESP PANEL BY RT-PCR (FLU A&B, COVID) [id number]    EKG None  Radiology DG Chest 2 View  Result Date: 05/26/2021 CLINICAL DATA:  Shortness of breath, chest pain. EXAM: CHEST - 2 VIEW COMPARISON:  October 28, 2020.  March 18, 2021. FINDINGS: Stable cardiomegaly. Status post cardiac valve repair. No pneumothorax is noted. Stable prominent epicardial fat pad is noted in right lung base. Based on prior CT scan, the opacity seen on lateral projection in expected position of right middle lobe appears to represent loculated pleural effusion. Left lung is unremarkable. Bony thorax unremarkable. IMPRESSION: Based on prior CT scan, the opacity seen in lateral projection in expected position of right middle lobe appears to represent loculated pleural effusion. No new abnormality is noted. Electronically  Signed   By: Marijo Conception M.D.   On: 05/26/2021 09:59    Procedures Procedures   Medications Ordered in ED Medications  methylPREDNISolone sodium succinate (SOLU-MEDROL) 125 mg/2 mL injection 125 mg (125 mg Intravenous Given 05/27/21 0349)    ED Course  I have reviewed the triage vital signs and the nursing notes.  Pertinent labs & imaging results that were available during my care of the patient were reviewed by me and considered in my medical decision making (see chart for details).    MDM Rules/Calculators/A&P                           Patient presents with concerns over cough, chest congestion, shortness of breath.  Symptoms have been ongoing for 2 weeks.  She was seen at urgent care yesterday and prescribed prednisone and doxycycline but neglected to fill the prescription.  She reports that she continued to be short of breath overnight so came to the ER.  Patient with wheezing and decreased breath sounds bilaterally.  X-ray from yesterday was reviewed, no acute findings, does not require repeat.  Basic labs unremarkable.  EKG unremarkable.  COVID testing is negative.  Will treat for acute COPD exacerbation.  Patient treated with albuterol and Atrovent here in the department, Solu-Medrol.  Respiratory rate  and wheezing improved.  Still seems a little short of breath.  Room air oxygen saturation 91 to 93% after nebulizer treatment.  Patient feels like she would like to go home, try the prescribed meds.  Husband reports that he already filled the prescription for the prednisone and doxycycline, she will start them this morning.  Given return precautions.   Final Clinical Impression(s) / ED Diagnoses Final diagnoses:  COPD exacerbation [facility])    Rx / DC Orders ED Discharge Orders     None        Maitri Schnoebelen, Gwenyth Allegra, MD 05/27/21 0532    Orpah Greek, MD 05/27/21 [phone]

## 2021-05-31 ENCOUNTER — Other Ambulatory Visit: Payer: Self-pay

## 2021-05-31 ENCOUNTER — Ambulatory Visit (INDEPENDENT_AMBULATORY_CARE_PROVIDER_SITE_OTHER): Payer: Medicare PPO | Admitting: *Deleted

## 2021-05-31 DIAGNOSIS — Z5181 Encounter for therapeutic drug level monitoring: Secondary | ICD-10-CM | POA: Diagnosis not present

## 2021-05-31 DIAGNOSIS — I4891 Unspecified atrial fibrillation: Secondary | ICD-10-CM | POA: Diagnosis not present

## 2021-05-31 DIAGNOSIS — I639 Cerebral infarction, unspecified: Secondary | ICD-10-CM

## 2021-05-31 LAB — POCT INR: INR: 3.4 — AB (ref 2.0–3.0)

## 2021-05-31 NOTE — Patient Instructions (Signed)
Take warfarin 1 tablet daily except 1 1/2 tablets on Thursday.  Recheck INR in 1 week.  Per last encounter:  Pending colonoscopy.  Date TBD Will hold warfarin and bridge with Lovenox

## 2021-05-31 NOTE — ED Provider Notes (Signed)
[facility]    CSN: [id number] Arrival date & time: 05/26/21  0848      History   Chief Complaint Chief Complaint  Patient presents with   Shortness of Breath    HPI [name] is a 75 y.o. female.   The history is provided by the patient. No language interpreter was used.  Shortness of Breath Severity:  Moderate Onset quality:  Gradual Timing:  Constant Progression:  Worsening Chronicity:  New Relieved by:  Nothing Worsened by:  Nothing Ineffective treatments:  None tried Associated symptoms: no fever    Past Medical History:  Diagnosis Date   Anemia    Asthma    Atrial fibrillation ([place])    Breast carcinoma ([place]) 1995   [id number]   Cardioembolic stroke ([place]) [id number]   Right frontal in 01/2012; normal carotid ultrasound; possible LAA thrombus by TEE; virtual complete neurologic recovery   Chronic kidney disease, stage 2, mildly decreased GFR    GFR of approximately 60   Depression    Diabetes mellitus without complication ([place])    Diverticulosis of colon (without mention of hemorrhage) 2012   Dr. Laural Golden   Fasting hyperglycemia    120 fasting   Gastroesophageal reflux disease    Gastroparesis    Hemorrhoids    Hyperlipidemia    Hypertension    pt denies 05/30/13     Dr Kathe Mariner   Hyponatremia    Rheumatic heart disease    a. 07/2013 Echo: Ef 55-60%, Mod AS/AI, Mod-Sev MS, sev dil LA, PASP 58;  b. 07/2013 TEE: EF 35-40%, mild-mod AS/AI, mod-sev MS, LA smoke;  c. 07/2013 Cath: elev R heart pressures, nl cors.   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   Syncope 04/30/2020   Asthma exacerbation 11/12/2016   Acute CHF (congestive heart failure) ([place]) 11/12/2016   Synovial cyst of lumbar spine 05/13/2014   Encounter for therapeutic drug monitoring 11/06/2013   Heart valve replaced by other means 08/05/2013   Hyponatremia    Atrial fibrillation (HCC)     Moderate aortic stenosis 07/12/2013   Moderate aortic insufficiency 07/12/2013   Severe mitral valve stenosis 07/09/2013   Mitral stenosis 07/08/2013   Acute respiratory failure with hypoxia ([place]) 07/07/2013   Acute pulmonary edema ([place]) 07/07/2013   Pleural effusion, bilateral 07/07/2013   Chest pain 07/07/2013   Asthma, cough variant 03/27/2013   Upper airway cough syndrome in pt with mild cough variant asthma  09/17/2012   Dyspnea on exertion 06/13/2012   Edema 06/13/2012   Rheumatic heart disease 06/13/2012   Fasting hyperglycemia    Chronic anticoagulation 01/26/2012   Hypothyroidism [id number]   Cardioembolic stroke ([place]) [id number]   Hyperlipidemia    Essential hypertension    Breast carcinoma (HCC)    Gastroesophageal reflux disease     Past Surgical History:  Procedure Laterality Date   AORTIC VALVE REPLACEMENT N/A 07/18/2013   Procedure: AORTIC VALVE REPLACEMENT (AVR);  Surgeon: Gaye Pollack, MD;  Location: [facility];  Service: Open Heart Surgery;  Laterality: N/A;   BACK SURGERY     BREAST LUMPECTOMY Right 1995   CARDIAC CATHETERIZATION     CARDIOVERSION N/A 07/26/2013   Procedure: CARDIOVERSION;  Surgeon: Fay Records, MD;  Location: [facility];  Service: Cardiovascular;  Laterality: N/A;   COLONOSCOPY  2012   Negative screening procedure   DILATION AND CURETTAGE OF UTERUS  ESOPHAGEAL MANOMETRY N/A 06/17/2013   Procedure: ESOPHAGEAL MANOMETRY (EM);  Surgeon: Sable Feil, MD;  Location: [facility];  Service: Endoscopy;  Laterality: N/A;   INTRAOPERATIVE TRANSESOPHAGEAL ECHOCARDIOGRAM N/A 07/18/2013   Procedure: INTRAOPERATIVE TRANSESOPHAGEAL ECHOCARDIOGRAM;  Surgeon: Gaye Pollack, MD;  Location: [facility];  Service: Open Heart Surgery;  Laterality: N/A;   LEFT AND RIGHT HEART CATHETERIZATION WITH CORONARY ANGIOGRAM N/A 07/10/2013   Procedure: LEFT AND RIGHT HEART CATHETERIZATION WITH CORONARY ANGIOGRAM;  Surgeon: Burnell Blanks, MD;  Location: [facility] CATH  LAB;  Service: Cardiovascular;  Laterality: N/A;   LUMBAR LAMINECTOMY/DECOMPRESSION MICRODISCECTOMY Right 05/13/2014   Procedure: LUMBAR LAMINECTOMY/DECOMPRESSION MICRODISCECTOMY 1 LEVEL  lumbar four/five;  Surgeon: Faythe Ghee, MD;  Location: [facility]URO [facility]S;  Service: Neurosurgery;  Laterality: Right;   MITRAL VALVE REPLACEMENT N/A 07/18/2013   Procedure: MITRAL VALVE (MV) REPLACEMENT;  Surgeon: Gaye Pollack, MD;  Location: [facility];  Service: Open Heart Surgery;  Laterality: N/A;   MITRAL VALVE [place], closed mitral valvulotomy by finger fracture   TEE WITHOUT CARDIOVERSION  01/24/2012   Procedure: TRANSESOPHAGEAL ECHOCARDIOGRAM (TEE);  Surgeon: Lelon Perla, MD;  Location: [facility] ENDOSCOPY;  Service: Cardiovascular;  Laterality: N/A;   TEE WITHOUT CARDIOVERSION N/A 07/11/2013   Procedure: TRANSESOPHAGEAL ECHOCARDIOGRAM (TEE);  Surgeon: Thayer Headings, MD;  Location: [facility];  Service: Cardiovascular;  Laterality: N/A;   TEE WITHOUT CARDIOVERSION N/A 07/26/2013   Procedure: TRANSESOPHAGEAL ECHOCARDIOGRAM (TEE);  Surgeon: Fay Records, MD;  Location: [facility] ENDOSCOPY;  Service: Cardiovascular;  Laterality: N/A;   TUBAL LIGATION  1973    OB History   No obstetric history on file.      Home Medications    Prior to Admission medications   Medication Sig Start Date End Date Taking? Authorizing Provider  doxycycline (VIBRAMYCIN) 100 MG capsule Take 1 capsule (100 mg total) by mouth 2 (two) times daily. 05/26/21  Yes Caryl Ada K, PA-C  predniSONE (DELTASONE) 50 MG tablet One tablet a day 05/26/21  Yes Fransico Meadow, PA-C  albuterol (VENTOLIN HFA) 108 (90 Base) MCG/ACT inhaler Inhale 2 puffs into the lungs every 4 (four) hours as needed for wheezing or shortness of breath.     [provider]  atorvastatin (LIPITOR) 40 MG tablet TAKE ONE TABLET BY MOUTH ONCE DAILY. 12/09/20   Arnoldo Lenis, MD  dextromethorphan-guaiFENesin (MUCINEX DM) 30-600 MG 12hr tablet  Take 1 tablet by mouth 2 (two) times daily.    [provider]  diltiazem (CARDIZEM CD) 180 MG 24 hr capsule Take 1 capsule (180 mg total) by mouth daily. 08/03/13   Collins, Gina L, PA-C  escitalopram (LEXAPRO) 10 MG tablet Take 10 mg by mouth daily.  04/10/15   [provider]  famotidine (PEPCID) 20 MG tablet One after supper 06/23/20   Tanda Rockers, MD  fexofenadine (ALLEGRA) 30 MG tablet Take 30 mg by mouth daily.     [provider]  FLOVENT HFA 44 MCG/ACT inhaler Inhale 2 puffs into the lungs 2 (two) times daily. 12/16/20   [provider]  fluticasone (FLONASE) 50 MCG/ACT nasal spray Place 1 spray into both nostrils daily as needed for allergies.  09/05/13   Arnoldo Lenis, MD  furosemide (LASIX) 20 MG tablet TAKE (1) TABLET BY MOUTH ONCE DAILY FOR SWELLING. 09/01/20   Arnoldo Lenis, MD  gabapentin (NEURONTIN) 100 MG capsule Take 1 capsule (100 mg total) by mouth 4 (four) times daily. 09/16/20  Tanda Rockers, MD  levothyroxine (SYNTHROID) 100 MCG tablet Take 100 mcg by mouth daily before breakfast.  02/19/20   [provider]  metFORMIN (GLUCOPHAGE) 500 MG tablet Take 1 tablet (500 mg total) by mouth 2 (two) times daily with a meal. 08/03/13   Collins, Gina L, PA-C  metoprolol tartrate (LOPRESSOR) 25 MG tablet TAKE (1) TABLET BY MOUTH TWICE DAILY. 05/05/21   Arnoldo Lenis, MD  Multiple Vitamin (MULITIVITAMIN WITH MINERALS) TABS Take 1 tablet by mouth daily.    [provider]  olmesartan (BENICAR) 5 MG tablet Take 5 mg by mouth daily.    [provider]  pantoprazole (PROTONIX) 40 MG tablet TAKE 1 TABLET 30 TO 60 MINUTES BEFORE FIRST MEAL OF THE DAY. 09/09/20   Tanda Rockers, MD  potassium chloride SA (K-DUR,KLOR-CON) 20 MEQ tablet Take 20 mEq by mouth daily. 08/14/13   Gaye Pollack, MD  warfarin (COUMADIN) 2.5 MG tablet TAKE AS DIRECTED BY COUMADIN CLINIC. 04/21/21   Arnoldo Lenis, MD    Family  History Family History  Problem Relation Age of Onset   Heart disease Brother 55       MI   Rheum arthritis Maternal Grandmother    Asthma Maternal Grandfather    Hypothyroidism Mother    Diabetes Sister    Cirrhosis Father     Social History Social History   Tobacco Use   Smoking status: Never   Smokeless tobacco: Never  Vaping Use   Vaping Use: Never used  Substance Use Topics   Alcohol use: No    Alcohol/week: 0.0 standard drinks   Drug use: No     Allergies   Amiodarone   Review of Systems Review of Systems  Constitutional:  Negative for fever.  Respiratory:  Positive for shortness of breath.   All other systems reviewed and are negative.   Physical Exam Triage Vital Signs ED Triage Vitals [05/26/21 0858]  Enc Vitals Group     BP 115/72     Pulse Rate 77     Resp 20     Temp 98.2 F (36.8 C)     Temp Source Oral     SpO2 90 %     Weight      Height      Head Circumference      Peak Flow      Pain Score 4     Pain Loc      Pain Edu?      Excl. in [place]?    No data found.  Updated Vital Signs BP 115/72 (BP Location: Right Arm)   Pulse 77   Temp 98.2 F (36.8 C) (Oral)   Resp 20   SpO2 90%   Visual Acuity Right Eye Distance:   Left Eye Distance:   Bilateral Distance:    Right Eye Near:   Left Eye Near:    Bilateral Near:     Physical Exam Vitals reviewed.  Cardiovascular:     Rate and Rhythm: Normal rate.  Pulmonary:     Effort: Pulmonary effort is normal.     Breath sounds: No decreased breath sounds.  Chest:     Chest wall: No mass or tenderness.  Musculoskeletal:        General: Normal range of motion.     Cervical back: Normal range of motion.  Skin:    General: Skin is warm.  Neurological:     General: No focal deficit present.  Mental Status: She is alert.  Psychiatric:        Mood and Affect: Mood normal.     UC Treatments / Results  Labs (all labs ordered are listed, but only abnormal results are  displayed) Labs Reviewed  COVID-19, FLU A+B NAA   Narrative:    Performed at:  [address], [place], [place]  [id number] Lab Director: Rush Farmer MD, Phone:  [phone]    EKG   Radiology No results found.  Procedures Procedures (including critical care time)  Medications Ordered in UC Medications  albuterol (VENTOLIN HFA) 108 (90 Base) MCG/ACT inhaler 2 puff (2 puffs Inhalation Given 05/26/21 0935)    Initial Impression / Assessment and Plan / UC Course  I have reviewed the triage vital signs and the nursing notes.  Pertinent labs & imaging results that were available during my care of the patient were reviewed by me and considered in my medical decision making (see chart for details).     MDM: Pt given rx for prednisone and doxycycline  Final Clinical Impressions(s) / UC Diagnoses   Final diagnoses:  Acute respiratory failure with hypoxia (HCC)  Upper airway cough syndrome in pt with mild cough variant asthma      Discharge Instructions      Return if any problems.  Go to the Emergency department if symptoms worsen or change Your covid and flu test are pending   ED Prescriptions     Medication Sig Dispense Auth. Provider   predniSONE (DELTASONE) 50 MG tablet One tablet a day 6 tablet Astryd Pearcy K, PA-C   doxycycline (VIBRAMYCIN) 100 MG capsule Take 1 capsule (100 mg total) by mouth 2 (two) times daily. 20 capsule Fransico Meadow, [place]      PDMP not reviewed this encounter. Marland Kitchena   Fransico Meadow, [place] 05/31/21 [phone]

## 2021-06-01 ENCOUNTER — Emergency Department (HOSPITAL_COMMUNITY): Payer: Medicare PPO

## 2021-06-01 ENCOUNTER — Inpatient Hospital Stay (HOSPITAL_COMMUNITY)
Admission: EM | Admit: 2021-06-01 | Discharge: 2021-06-04 | DRG: 192 | Disposition: A | Discharge status: Home / Self Care | Payer: Medicare PPO | Attending: Internal Medicine | Admitting: Internal Medicine

## 2021-06-01 ENCOUNTER — Other Ambulatory Visit: Payer: Self-pay

## 2021-06-01 ENCOUNTER — Encounter (HOSPITAL_COMMUNITY): Payer: Self-pay | Admitting: *Deleted

## 2021-06-01 DIAGNOSIS — I129 Hypertensive chronic kidney disease with stage 1 through stage 4 chronic kidney disease, or unspecified chronic kidney disease: Secondary | ICD-10-CM | POA: Diagnosis present

## 2021-06-01 DIAGNOSIS — I10 Essential (primary) hypertension: Secondary | ICD-10-CM | POA: Diagnosis present

## 2021-06-01 DIAGNOSIS — B974 Respiratory syncytial virus as the cause of diseases classified elsewhere: Secondary | ICD-10-CM | POA: Diagnosis present

## 2021-06-01 DIAGNOSIS — Z888 Allergy status to other drugs, medicaments and biological substances status: Secondary | ICD-10-CM | POA: Diagnosis not present

## 2021-06-01 DIAGNOSIS — J441 Chronic obstructive pulmonary disease with (acute) exacerbation: Secondary | ICD-10-CM | POA: Diagnosis present

## 2021-06-01 DIAGNOSIS — Z8249 Family history of ischemic heart disease and other diseases of the circulatory system: Secondary | ICD-10-CM | POA: Diagnosis not present

## 2021-06-01 DIAGNOSIS — E1165 Type 2 diabetes mellitus with hyperglycemia: Secondary | ICD-10-CM | POA: Diagnosis present

## 2021-06-01 DIAGNOSIS — Z20822 Contact with and (suspected) exposure to covid-19: Secondary | ICD-10-CM | POA: Diagnosis present

## 2021-06-01 DIAGNOSIS — Z833 Family history of diabetes mellitus: Secondary | ICD-10-CM

## 2021-06-01 DIAGNOSIS — R0602 Shortness of breath: Secondary | ICD-10-CM | POA: Diagnosis present

## 2021-06-01 DIAGNOSIS — Z79890 Hormone replacement therapy (postmenopausal): Secondary | ICD-10-CM

## 2021-06-01 DIAGNOSIS — J4521 Mild intermittent asthma with (acute) exacerbation: Secondary | ICD-10-CM | POA: Diagnosis not present

## 2021-06-01 DIAGNOSIS — N182 Chronic kidney disease, stage 2 (mild): Secondary | ICD-10-CM | POA: Diagnosis present

## 2021-06-01 DIAGNOSIS — E039 Hypothyroidism, unspecified: Secondary | ICD-10-CM | POA: Diagnosis present

## 2021-06-01 DIAGNOSIS — I639 Cerebral infarction, unspecified: Secondary | ICD-10-CM | POA: Diagnosis present

## 2021-06-01 DIAGNOSIS — J4531 Mild persistent asthma with (acute) exacerbation: Secondary | ICD-10-CM | POA: Diagnosis not present

## 2021-06-01 DIAGNOSIS — R059 Cough, unspecified: Secondary | ICD-10-CM | POA: Diagnosis not present

## 2021-06-01 DIAGNOSIS — Z952 Presence of prosthetic heart valve: Secondary | ICD-10-CM

## 2021-06-01 DIAGNOSIS — Z8673 Personal history of transient ischemic attack (TIA), and cerebral infarction without residual deficits: Secondary | ICD-10-CM

## 2021-06-01 DIAGNOSIS — Z8349 Family history of other endocrine, nutritional and metabolic diseases: Secondary | ICD-10-CM

## 2021-06-01 DIAGNOSIS — K219 Gastro-esophageal reflux disease without esophagitis: Secondary | ICD-10-CM | POA: Diagnosis present

## 2021-06-01 DIAGNOSIS — E7800 Pure hypercholesterolemia, unspecified: Secondary | ICD-10-CM | POA: Diagnosis not present

## 2021-06-01 DIAGNOSIS — Z7901 Long term (current) use of anticoagulants: Secondary | ICD-10-CM

## 2021-06-01 DIAGNOSIS — Z79899 Other long term (current) drug therapy: Secondary | ICD-10-CM | POA: Diagnosis not present

## 2021-06-01 DIAGNOSIS — E119 Type 2 diabetes mellitus without complications: Secondary | ICD-10-CM

## 2021-06-01 DIAGNOSIS — E1122 Type 2 diabetes mellitus with diabetic chronic kidney disease: Secondary | ICD-10-CM | POA: Diagnosis present

## 2021-06-01 DIAGNOSIS — E038 Other specified hypothyroidism: Secondary | ICD-10-CM | POA: Diagnosis not present

## 2021-06-01 DIAGNOSIS — I4891 Unspecified atrial fibrillation: Secondary | ICD-10-CM | POA: Diagnosis present

## 2021-06-01 DIAGNOSIS — B338 Other specified viral diseases: Secondary | ICD-10-CM | POA: Diagnosis present

## 2021-06-01 DIAGNOSIS — E785 Hyperlipidemia, unspecified: Secondary | ICD-10-CM | POA: Diagnosis present

## 2021-06-01 DIAGNOSIS — I4811 Longstanding persistent atrial fibrillation: Secondary | ICD-10-CM | POA: Diagnosis not present

## 2021-06-01 DIAGNOSIS — E782 Mixed hyperlipidemia: Secondary | ICD-10-CM | POA: Diagnosis present

## 2021-06-01 DIAGNOSIS — Z853 Personal history of malignant neoplasm of breast: Secondary | ICD-10-CM | POA: Diagnosis not present

## 2021-06-01 DIAGNOSIS — Z8261 Family history of arthritis: Secondary | ICD-10-CM

## 2021-06-01 DIAGNOSIS — Z825 Family history of asthma and other chronic lower respiratory diseases: Secondary | ICD-10-CM | POA: Diagnosis not present

## 2021-06-01 DIAGNOSIS — J45901 Unspecified asthma with (acute) exacerbation: Secondary | ICD-10-CM | POA: Diagnosis present

## 2021-06-01 DIAGNOSIS — I480 Paroxysmal atrial fibrillation: Secondary | ICD-10-CM | POA: Diagnosis not present

## 2021-06-01 DIAGNOSIS — E1169 Type 2 diabetes mellitus with other specified complication: Secondary | ICD-10-CM

## 2021-06-01 DIAGNOSIS — I517 Cardiomegaly: Secondary | ICD-10-CM | POA: Diagnosis not present

## 2021-06-01 LAB — COMPREHENSIVE METABOLIC PANEL
ALT: 34 U/L (ref 0–44)
AST: 24 U/L (ref 15–41)
Albumin: 4.1 g/dL (ref 3.5–5.0)
Alkaline Phosphatase: 59 U/L (ref 38–126)
Anion gap: 11 (ref 5–15)
BUN: 22 mg/dL (ref 8–23)
CO2: 26 mmol/L (ref 22–32)
Calcium: 9 mg/dL (ref 8.9–10.3)
Chloride: 95 mmol/L — ABNORMAL LOW (ref 98–111)
Creatinine, Ser: 1.05 mg/dL — ABNORMAL HIGH (ref 0.44–1.00)
GFR, Estimated: 55 mL/min — ABNORMAL LOW (ref >60)
Glucose, Bld: 262 mg/dL — ABNORMAL HIGH (ref 70–99)
Potassium: 4.6 mmol/L (ref 3.5–5.1)
Sodium: 132 mmol/L — ABNORMAL LOW (ref 135–145)
Total Bilirubin: 0.6 mg/dL (ref 0.3–1.2)
Total Protein: 7.3 g/dL (ref 6.5–8.1)

## 2021-06-01 LAB — CBC WITH DIFFERENTIAL/PLATELET
Abs Immature Granulocytes: 0.08 10*3/uL — ABNORMAL HIGH (ref 0.00–0.07)
Basophils Absolute: 0 10*3/uL (ref 0.0–0.1)
Basophils Relative: 0 %
Eosinophils Absolute: 0 10*3/uL (ref 0.0–0.5)
Eosinophils Relative: 0 %
HCT: 43.5 % (ref 36.0–46.0)
Hemoglobin: 14 g/dL (ref 12.0–15.0)
Immature Granulocytes: 1 %
Lymphocytes Relative: 9 %
Lymphs Abs: 0.8 10*3/uL (ref 0.7–4.0)
MCH: 30.2 pg (ref 26.0–34.0)
MCHC: 32.2 g/dL (ref 30.0–36.0)
MCV: 93.8 fL (ref 80.0–100.0)
Monocytes Absolute: 0.3 10*3/uL (ref 0.1–1.0)
Monocytes Relative: 3 %
Neutro Abs: 8.2 10*3/uL — ABNORMAL HIGH (ref 1.7–7.7)
Neutrophils Relative %: 87 %
Platelets: 184 10*3/uL (ref 150–400)
RBC: 4.64 MIL/uL (ref 3.87–5.11)
RDW: 14.4 % (ref 11.5–15.5)
WBC: 9.5 10*3/uL (ref 4.0–10.5)
nRBC: 0 % (ref 0.0–0.2)

## 2021-06-01 LAB — D-DIMER, QUANTITATIVE: D-Dimer, Quant: 1.54 ug/mL-FEU — ABNORMAL HIGH (ref 0.00–0.50)

## 2021-06-01 LAB — RESP PANEL BY RT-PCR (FLU A&B, COVID) ARPGX2
Influenza A by PCR: NEGATIVE
Influenza B by PCR: NEGATIVE
SARS Coronavirus 2 by RT PCR: NEGATIVE

## 2021-06-01 LAB — TROPONIN I (HIGH SENSITIVITY)
Troponin I (High Sensitivity): 13 ng/L (ref <18)
Troponin I (High Sensitivity): 12 ng/L (ref <18)

## 2021-06-01 LAB — CBG MONITORING, ED: Glucose-Capillary: 303 mg/dL — ABNORMAL HIGH (ref 70–99)

## 2021-06-01 LAB — PROTIME-INR
INR: 3 — ABNORMAL HIGH (ref 0.8–1.2)
Prothrombin Time: 31.4 seconds — ABNORMAL HIGH (ref 11.4–15.2)

## 2021-06-01 LAB — BRAIN NATRIURETIC PEPTIDE: B Natriuretic Peptide: 114 pg/mL — ABNORMAL HIGH (ref 0.0–100.0)

## 2021-06-01 MED ORDER — ACETAMINOPHEN 325 MG PO TABS
650.0000 mg | ORAL_TABLET | Freq: Four times a day (QID) | ORAL | Status: DC | PRN
  Administered 2021-06-03: 650 mg via ORAL
  Filled 2021-06-01: qty 2

## 2021-06-01 MED ORDER — METHYLPREDNISOLONE SODIUM SUCC 125 MG IJ SOLR
125.0000 mg | Freq: Once | INTRAMUSCULAR | Status: AC
  Administered 2021-06-01: 125 mg via INTRAVENOUS
  Filled 2021-06-01 (×2): qty 2

## 2021-06-01 MED ORDER — POTASSIUM CHLORIDE CRYS ER 20 MEQ PO TBCR
20.0000 meq | EXTENDED_RELEASE_TABLET | Freq: Every day | ORAL | Status: DC
  Administered 2021-06-02 – 2021-06-04 (×3): 20 meq via ORAL
  Filled 2021-06-01 (×3): qty 1

## 2021-06-01 MED ORDER — MELATONIN 5 MG PO TABS
5.0000 mg | ORAL_TABLET | Freq: Every evening | ORAL | Status: DC | PRN
  Filled 2021-06-01: qty 1

## 2021-06-01 MED ORDER — METHYLPREDNISOLONE SODIUM SUCC 125 MG IJ SOLR: 60.0000 mg | Freq: Three times a day (TID) | INTRAMUSCULAR | Status: DC

## 2021-06-01 MED ORDER — PREDNISONE 20 MG PO TABS: 40.0000 mg | ORAL_TABLET | Freq: Every day | ORAL | Status: DC

## 2021-06-01 MED ORDER — ALBUTEROL SULFATE (2.5 MG/3ML) 0.083% IN NEBU
2.5000 mg | INHALATION_SOLUTION | Freq: Once | RESPIRATORY_TRACT | Status: AC
  Administered 2021-06-01: 2.5 mg via RESPIRATORY_TRACT
  Filled 2021-06-01: qty 3

## 2021-06-01 MED ORDER — INSULIN ASPART 100 UNIT/ML IJ SOLN
0.0000 [IU] | Freq: Every day | INTRAMUSCULAR | Status: DC
  Administered 2021-06-01: 4 [IU] via SUBCUTANEOUS
  Administered 2021-06-02: 3 [IU] via SUBCUTANEOUS
  Filled 2021-06-01: qty 1

## 2021-06-01 MED ORDER — FUROSEMIDE 20 MG PO TABS
20.0000 mg | ORAL_TABLET | Freq: Every day | ORAL | Status: DC
  Administered 2021-06-02 – 2021-06-04 (×3): 20 mg via ORAL
  Filled 2021-06-01 (×3): qty 1

## 2021-06-01 MED ORDER — METOPROLOL TARTRATE 25 MG PO TABS
25.0000 mg | ORAL_TABLET | Freq: Two times a day (BID) | ORAL | Status: DC
  Administered 2021-06-01 – 2021-06-03 (×4): 25 mg via ORAL
  Filled 2021-06-01 (×4): qty 1

## 2021-06-01 MED ORDER — IRBESARTAN 75 MG PO TABS
37.5000 mg | ORAL_TABLET | Freq: Every day | ORAL | Status: DC
  Administered 2021-06-02 – 2021-06-04 (×3): 37.5 mg via ORAL
  Filled 2021-06-01 (×3): qty 1

## 2021-06-01 MED ORDER — AZITHROMYCIN 250 MG PO TABS
500.0000 mg | ORAL_TABLET | Freq: Every day | ORAL | Status: DC
  Administered 2021-06-01 – 2021-06-04 (×4): 500 mg via ORAL
  Filled 2021-06-01 (×4): qty 2

## 2021-06-01 MED ORDER — ALBUTEROL SULFATE (2.5 MG/3ML) 0.083% IN NEBU: 2.5000 mg | INHALATION_SOLUTION | Freq: Once | RESPIRATORY_TRACT | Status: DC

## 2021-06-01 MED ORDER — LORATADINE 10 MG PO TABS
10.0000 mg | ORAL_TABLET | Freq: Every day | ORAL | Status: DC
  Administered 2021-06-02 – 2021-06-04 (×3): 10 mg via ORAL
  Filled 2021-06-01 (×3): qty 1

## 2021-06-01 MED ORDER — DILTIAZEM HCL ER COATED BEADS 180 MG PO CP24
180.0000 mg | ORAL_CAPSULE | Freq: Every day | ORAL | Status: DC
  Administered 2021-06-02 – 2021-06-04 (×3): 180 mg via ORAL
  Filled 2021-06-01 (×3): qty 1

## 2021-06-01 MED ORDER — IPRATROPIUM-ALBUTEROL 0.5-2.5 (3) MG/3ML IN SOLN
3.0000 mL | Freq: Once | RESPIRATORY_TRACT | Status: DC
  Filled 2021-06-01: qty 3

## 2021-06-01 MED ORDER — LEVOTHYROXINE SODIUM 100 MCG PO TABS
100.0000 ug | ORAL_TABLET | Freq: Every day | ORAL | Status: DC
  Administered 2021-06-02 – 2021-06-04 (×3): 100 ug via ORAL
  Filled 2021-06-01 (×3): qty 1

## 2021-06-01 MED ORDER — METHYLPREDNISOLONE SODIUM SUCC 125 MG IJ SOLR
90.0000 mg | Freq: Two times a day (BID) | INTRAMUSCULAR | Status: AC
  Administered 2021-06-02 (×2): 90 mg via INTRAVENOUS
  Filled 2021-06-01 (×2): qty 2

## 2021-06-01 MED ORDER — ONDANSETRON HCL 4 MG PO TABS: 4.0000 mg | ORAL_TABLET | Freq: Four times a day (QID) | ORAL | Status: DC | PRN

## 2021-06-01 MED ORDER — INSULIN ASPART 100 UNIT/ML IJ SOLN
0.0000 [IU] | Freq: Three times a day (TID) | INTRAMUSCULAR | Status: DC
  Administered 2021-06-02: 5 [IU] via SUBCUTANEOUS
  Administered 2021-06-02: 11 [IU] via SUBCUTANEOUS
  Administered 2021-06-02: 5 [IU] via SUBCUTANEOUS
  Administered 2021-06-03: 8 [IU] via SUBCUTANEOUS

## 2021-06-01 MED ORDER — IPRATROPIUM-ALBUTEROL 0.5-2.5 (3) MG/3ML IN SOLN
3.0000 mL | Freq: Four times a day (QID) | RESPIRATORY_TRACT | Status: DC
  Administered 2021-06-01 – 2021-06-02 (×5): 3 mL via RESPIRATORY_TRACT
  Filled 2021-06-01 (×4): qty 3

## 2021-06-01 MED ORDER — IOHEXOL 350 MG/ML SOLN
100.0000 mL | Freq: Once | INTRAVENOUS | Status: AC | PRN
  Administered 2021-06-01: 80 mL via INTRAVENOUS

## 2021-06-01 MED ORDER — GUAIFENESIN ER 600 MG PO TB12
600.0000 mg | ORAL_TABLET | Freq: Two times a day (BID) | ORAL | Status: DC
  Administered 2021-06-01 – 2021-06-02 (×2): 600 mg via ORAL
  Filled 2021-06-01 (×2): qty 1

## 2021-06-01 MED ORDER — ATORVASTATIN CALCIUM 40 MG PO TABS
40.0000 mg | ORAL_TABLET | Freq: Every day | ORAL | Status: DC
  Administered 2021-06-01 – 2021-06-04 (×4): 40 mg via ORAL
  Filled 2021-06-01 (×4): qty 1

## 2021-06-01 MED ORDER — PREDNISONE 20 MG PO TABS
40.0000 mg | ORAL_TABLET | Freq: Every day | ORAL | Status: DC
  Administered 2021-06-03 – 2021-06-04 (×2): 40 mg via ORAL
  Filled 2021-06-01 (×2): qty 2

## 2021-06-01 MED ORDER — ALBUTEROL SULFATE HFA 108 (90 BASE) MCG/ACT IN AERS
4.0000 | INHALATION_SPRAY | Freq: Once | RESPIRATORY_TRACT | Status: AC
  Administered 2021-06-01: 4 via RESPIRATORY_TRACT
  Filled 2021-06-01: qty 6.7

## 2021-06-01 MED ORDER — ONDANSETRON HCL 4 MG/2ML IJ SOLN: 4.0000 mg | Freq: Four times a day (QID) | INTRAMUSCULAR | Status: DC | PRN

## 2021-06-01 MED ORDER — MAGNESIUM SULFATE 2 GM/50ML IV SOLN
2.0000 g | Freq: Once | INTRAVENOUS | Status: AC
  Administered 2021-06-01: 2 g via INTRAVENOUS
  Filled 2021-06-01: qty 50

## 2021-06-01 MED ORDER — FAMOTIDINE 20 MG PO TABS
20.0000 mg | ORAL_TABLET | Freq: Two times a day (BID) | ORAL | Status: DC
  Administered 2021-06-02 – 2021-06-04 (×5): 20 mg via ORAL
  Filled 2021-06-01 (×5): qty 1

## 2021-06-01 MED ORDER — WARFARIN SODIUM 2.5 MG PO TABS
2.5000 mg | ORAL_TABLET | Freq: Once | ORAL | Status: AC
  Administered 2021-06-01: 2.5 mg via ORAL
  Filled 2021-06-01: qty 1

## 2021-06-01 MED ORDER — IPRATROPIUM-ALBUTEROL 0.5-2.5 (3) MG/3ML IN SOLN
3.0000 mL | Freq: Once | RESPIRATORY_TRACT | Status: AC
  Administered 2021-06-01: 3 mL via RESPIRATORY_TRACT
  Filled 2021-06-01: qty 3

## 2021-06-01 MED ORDER — ACETAMINOPHEN 650 MG RE SUPP: 650.0000 mg | Freq: Four times a day (QID) | RECTAL | Status: DC | PRN

## 2021-06-01 NOTE — ED Notes (Signed)
This nurse called Acute Care to give report, transferred to [facility] by receptionist. No answer. Receptionist reports that she will have Larene [place] return call to ED.

## 2021-06-01 NOTE — Assessment & Plan Note (Signed)
Stable

## 2021-06-01 NOTE — Plan of Care (Signed)
  Problem: Education: Goal: Knowledge of General Education information will improve Description Including pain rating scale, medication(s)/side effects and non-pharmacologic comfort measures Outcome: Progressing   Problem: Health Behavior/Discharge Planning: Goal: Ability to manage health-related needs will improve Outcome: Progressing   

## 2021-06-01 NOTE — ED Triage Notes (Signed)
Seen here for shortness of breath 5 days ago, states it has gotten worse

## 2021-06-01 NOTE — ED Notes (Signed)
Work of breathing improved following neb tx and IV steroids

## 2021-06-01 NOTE — Assessment & Plan Note (Signed)
Chronic. 

## 2021-06-01 NOTE — ED Notes (Signed)
[name] returns call to ED for report.

## 2021-06-01 NOTE — Assessment & Plan Note (Signed)
On coumadin. INR trending down. On coumadin for hx of afib and cardioembolic stroke.

## 2021-06-01 NOTE — Progress Notes (Signed)
ANTICOAGULATION CONSULT NOTE - Initial Consult  Pharmacy Consult for warfarin Indication: atrial fibrillation and stroke  Allergies  Allergen Reactions   Amiodarone Rash    Patient Measurements: Height: 5' 2.5" (158.8 cm) Weight: 68 kg (150 lb) IBW/kg (Calculated) : 51.25  Vital Signs: Temp: 98 F (36.7 C) (08/30 1357) Temp Source: Oral (08/30 1357) BP: 139/81 (08/30 2100) Pulse Rate: 107 (08/30 2100)  Labs: Recent Labs    05/31/21 0829 06/01/21 1554 06/01/21 1742  HGB  --  14.0  --   HCT  --  43.5  --   PLT  --  184  --   LABPROT  --  31.4*  --   INR 3.4* 3.0*  --   CREATININE  --  1.05*  --   TROPONINIHS  --  13 12    Estimated Creatinine Clearance: 41.1 mL/min (A) (by C-G formula based on SCr of 1.05 mg/dL (H)).   Medical History: Past Medical History:  Diagnosis Date   Anemia    Asthma    Atrial fibrillation ([place])    Breast carcinoma ([place]) 1995   Q000111Q   Cardioembolic stroke ([place]) Q000111Q   Right frontal in 01/2012; normal carotid ultrasound; possible LAA thrombus by TEE; virtual complete neurologic recovery   Chronic kidney disease, stage 2, mildly decreased GFR    GFR of approximately 60   Depression    Diabetes mellitus without complication ([place])    Diverticulosis of colon (without mention of hemorrhage) 2012   Dr. Laural Golden   Fasting hyperglycemia    120 fasting   Gastroesophageal reflux disease    Gastroparesis    Hemorrhoids    Hyperlipidemia    Hypertension    pt denies 05/30/13     Dr Kathe Mariner   Hyponatremia    Rheumatic heart disease    a. 07/2013 Echo: Ef 55-60%, Mod AS/AI, Mod-Sev MS, sev dil LA, PASP 58;  b. 07/2013 TEE: EF 35-40%, mild-mod AS/AI, mod-sev MS, LA smoke;  c. 07/2013 Cath: elev R heart pressures, nl cors.   Shortness of breath     Medications:  Scheduled:   albuterol  2.5 mg Nebulization Once   atorvastatin  40 mg Oral Daily   azithromycin  500 mg Oral Daily   [START ON 06/02/2021] diltiazem  180 mg Oral Daily    [START ON 06/02/2021] famotidine  20 mg Oral BID   [START ON 06/02/2021] furosemide  20 mg Oral Daily   guaiFENesin  600 mg Oral BID   [START ON 06/02/2021] insulin aspart  0-15 Units Subcutaneous TID WC   insulin aspart  0-5 Units Subcutaneous QHS   ipratropium-albuterol  3 mL Nebulization Once   ipratropium-albuterol  3 mL Nebulization Q6H   [START ON 06/02/2021] irbesartan  37.5 mg Oral Daily   [START ON 06/02/2021] levothyroxine  100 mcg Oral QAC breakfast   [START ON 06/02/2021] loratadine  10 mg Oral Daily   methylPREDNISolone (SOLU-MEDROL) injection  60 mg Intravenous Q8H   Followed by   Derrill Memo ON 06/03/2021] predniSONE  40 mg Oral Q breakfast   metoprolol tartrate  25 mg Oral BID   [START ON 06/02/2021] potassium chloride SA  20 mEq Oral Daily   warfarin  2.5 mg Oral Once    Assessment: Patient admitted for COPD exacerbation. Currently on coumadin for atrial fibrillation, h/o mitral & aortic valve replacement, and h/o stroke. Notable DDI include methylprednisolone/prednisone and doxycycline outpatient though not continued on admission. Home regimen from anticoagulation visit 8/29 is  warfarin 2.'5mg'$  daily except 3.'75mg'$  on Thursday. Goal of Therapy:  INR 2.5 to 3.5 Monitor platelets by anticoagulation protocol: Yes   Plan:  Will give home dose of warfarin 2.'5mg'$  tonight as patient's INR of 3.0 is therapeutic.Will obtain daily INR given acute status and potential DDI.   Jordan Hawks [facility] 06/01/2021,9:06 PM

## 2021-06-01 NOTE — ED Notes (Signed)
Pt ambulatory to bathroom with cane to urinate and then placed in wheelchair on telemetry and transported to Acute Care.

## 2021-06-01 NOTE — H&P (Signed)
History and Physical    [name] [id number] DOB: 03/24/1946 DOA: 06/01/2021  PCP: [name], MD   Patient coming from: Home  I have personally briefly reviewed patient's old medical records in [place]  CC: SOB, cough, wheezing HPI: 75 year old female with a history of COPD (lifelong non-smoker), hypertension, A. fib, chronic anticoagulation on Coumadin, history of mitral and aortic valve replacement, reflux, hyperlipidemia who presents with her husband today for a 1 week history of worsening shortness of breath.  Patient recently completed a prednisone taper today.  She was on 5 days worth of prednisone.  She is also p.o. doxycycline.  Majority history was provided by the patient's husband.  He states that patient had a stroke several years ago and her memory is quite poor.  Husband states the patient was having shortness of breath and cough this past week.  Was seen by her primary care physician.  Placed on doxycycline and p.o. prednisone.  Husband states that her breathing improved only for the first day on prednisone.  Patient been using an albuterol inhaler at home.  She also uses inhaled Flovent.  Patient does not have a home nebulizer machine.  Patient's been having worsening cough.  She has had shortness of breath at rest.  Due to the patient's wheezing at home and worsening shortness of breath, patient brought into the ER for evaluation.  In the ER, patient noted to be afebrile.  Room air saturations 93%.  Patient is COVID-negative.  BNP was stable at 114.  INR slightly elevated at 3.0.  Yesterday was 3.4.  CT of the chest was negative for acute PE.  Patient received multiple rounds of albuterol.  She also received IV Solu-Medrol.  Patient was still wheezing.  She was still coughing and feeling short of breath.  Due to the patient's unimproved respiratory status, acute COPD exacerbation, [facility] hospitalist contacted for admission.   ED Course: given IV solumedrol  and duonebs. CTA negative for PE.  Review of Systems:  Review of Systems  Constitutional:  Positive for malaise/fatigue. Negative for chills and fever.  HENT: Negative.    Eyes: Negative.   Respiratory:  Positive for cough, sputum production, shortness of breath and wheezing.   Cardiovascular: Negative.   Gastrointestinal: Negative.        Post-tussive emesis  Genitourinary: Negative.   Musculoskeletal: Negative.   Skin: Negative.   Neurological: Negative.   Endo/Heme/Allergies: Negative.   Psychiatric/Behavioral: Negative.    All other systems reviewed and are negative.  Past Medical History:  Diagnosis Date   Anemia    Asthma    Atrial fibrillation ([place])    Breast carcinoma ([place]) 1995   Q000111Q   Cardioembolic stroke ([place]) Q000111Q   Right frontal in 01/2012; normal carotid ultrasound; possible LAA thrombus by TEE; virtual complete neurologic recovery   Chronic kidney disease, stage 2, mildly decreased GFR    GFR of approximately 60   Depression    Diabetes mellitus without complication ([place])    Diverticulosis of colon (without mention of hemorrhage) 2012   Dr. Laural Golden   Fasting hyperglycemia    120 fasting   Gastroesophageal reflux disease    Gastroparesis    Hemorrhoids    Hyperlipidemia    Hypertension    pt denies 05/30/13     Dr Kathe Mariner   Hyponatremia    Rheumatic heart disease    a. 07/2013 Echo: Ef 55-60%, Mod AS/AI, Mod-Sev MS, sev dil LA, PASP 58;  b.  07/2013 TEE: EF 35-40%, mild-mod AS/AI, mod-sev MS, LA smoke;  c. 07/2013 Cath: elev R heart pressures, nl cors.   Shortness of breath     Past Surgical History:  Procedure Laterality Date   AORTIC VALVE REPLACEMENT N/A 07/18/2013   Procedure: AORTIC VALVE REPLACEMENT (AVR);  Surgeon: Gaye Pollack, MD;  Location: [facility];  Service: Open Heart Surgery;  Laterality: N/A;   BACK SURGERY     BREAST LUMPECTOMY Right 1995   CARDIAC CATHETERIZATION     CARDIOVERSION N/A 07/26/2013   Procedure:  CARDIOVERSION;  Surgeon: Fay Records, MD;  Location: [facility];  Service: Cardiovascular;  Laterality: N/A;   COLONOSCOPY  2012   Negative screening procedure   DILATION AND CURETTAGE OF UTERUS     ESOPHAGEAL MANOMETRY N/A 06/17/2013   Procedure: ESOPHAGEAL MANOMETRY (EM);  Surgeon: Sable Feil, MD;  Location: [facility];  Service: Endoscopy;  Laterality: N/A;   INTRAOPERATIVE TRANSESOPHAGEAL ECHOCARDIOGRAM N/A 07/18/2013   Procedure: INTRAOPERATIVE TRANSESOPHAGEAL ECHOCARDIOGRAM;  Surgeon: Gaye Pollack, MD;  Location: [facility];  Service: Open Heart Surgery;  Laterality: N/A;   LEFT AND RIGHT HEART CATHETERIZATION WITH CORONARY ANGIOGRAM N/A 07/10/2013   Procedure: LEFT AND RIGHT HEART CATHETERIZATION WITH CORONARY ANGIOGRAM;  Surgeon: Burnell Blanks, MD;  Location: [facility] CATH LAB;  Service: Cardiovascular;  Laterality: N/A;   LUMBAR LAMINECTOMY/DECOMPRESSION MICRODISCECTOMY Right 05/13/2014   Procedure: LUMBAR LAMINECTOMY/DECOMPRESSION MICRODISCECTOMY 1 LEVEL  lumbar four/five;  Surgeon: Faythe Ghee, MD;  Location: [facility]URO [facility];  Service: Neurosurgery;  Laterality: Right;   MITRAL VALVE REPLACEMENT N/A 07/18/2013   Procedure: MITRAL VALVE (MV) REPLACEMENT;  Surgeon: Gaye Pollack, MD;  Location: [facility];  Service: Open Heart Surgery;  Laterality: N/A;   MITRAL VALVE [place], closed mitral valvulotomy by finger fracture   TEE WITHOUT CARDIOVERSION  01/24/2012   Procedure: TRANSESOPHAGEAL ECHOCARDIOGRAM (TEE);  Surgeon: Lelon Perla, MD;  Location: [facility] ENDOSCOPY;  Service: Cardiovascular;  Laterality: N/A;   TEE WITHOUT CARDIOVERSION N/A 07/11/2013   Procedure: TRANSESOPHAGEAL ECHOCARDIOGRAM (TEE);  Surgeon: Thayer Headings, MD;  Location: [facility];  Service: Cardiovascular;  Laterality: N/A;   TEE WITHOUT CARDIOVERSION N/A 07/26/2013   Procedure: TRANSESOPHAGEAL ECHOCARDIOGRAM (TEE);  Surgeon: Fay Records, MD;  [facility] END[facility]COPY;  Service:  Cardiovascular;  Laterality: N/A;   [place]     reports that she has never smoked. She has never used smokeless tobacco. She reports that she does not drink alcohol and does not use drugs.  Allergies  Allergen Reactions   Amiodarone Rash    Family History  Problem Relation Age of Onset   Heart disease Brother 58       MI   Rheum arthritis Maternal Grandmother    Asthma Maternal Grandfather    Hypothyroidism Mother    Diabetes Sister    Cirrhosis Father     Prior to Admission medications   Medication Sig Start Date End Date Taking? Authorizing Provider  albuterol (VENTOLIN HFA) 108 (90 Base) MCG/ACT inhaler Inhale 2 puffs into the lungs every 4 (four) hours as needed for wheezing or shortness of breath.    Yes [provider]  atorvastatin (LIPITOR) 40 MG tablet TAKE ONE TABLET BY MOUTH ONCE DAILY. 12/09/20  Yes Branch, Alphonse Guild, MD  cetirizine (ZYRTEC) 10 MG tablet Take 10 mg by mouth daily.   Yes [provider]  diltiazem (CARDIZEM CD) 180 MG 24 hr capsule Take 1 capsule (180  mg total) by mouth daily. 08/03/13  Yes Collins, Gina L, PA-C  doxycycline (VIBRAMYCIN) 100 MG capsule Take 1 capsule (100 mg total) by mouth 2 (two) times daily. 05/26/21  Yes Caryl Ada K, PA-C  escitalopram (LEXAPRO) 10 MG tablet Take 10 mg by mouth daily.  04/10/15  Yes [provider]  famotidine (PEPCID) 20 MG tablet One after supper 06/23/20  Yes Tanda Rockers, MD  FLOVENT HFA 44 MCG/ACT inhaler Inhale 2 puffs into the lungs 2 (two) times daily. 12/16/20  Yes [provider]  fluticasone (FLONASE) 50 MCG/ACT nasal spray Place 1 spray into both nostrils daily as needed for allergies.  09/05/13  Yes Branch, Alphonse Guild, MD  furosemide (LASIX) 20 MG tablet TAKE (1) TABLET BY MOUTH ONCE DAILY FOR SWELLING. Patient taking differently: Take 20 mg by mouth daily. 09/01/20  Yes BranchAlphonse Guild, MD  levothyroxine (SYNTHROID) 100 MCG tablet Take 100 mcg by mouth  daily before breakfast.  02/19/20  Yes [provider]  metFORMIN (GLUCOPHAGE) 500 MG tablet Take 1 tablet (500 mg total) by mouth 2 (two) times daily with a meal. 08/03/13  Yes Collins, Gina L, PA-C  metoprolol tartrate (LOPRESSOR) 25 MG tablet TAKE (1) TABLET BY MOUTH TWICE DAILY. Patient taking differently: Take 25 mg by mouth 2 (two) times daily. 05/05/21  Yes BranchAlphonse Guild, MD  Multiple Vitamin (MULITIVITAMIN WITH MINERALS) TABS Take 1 tablet by mouth daily.   Yes [provider]  olmesartan (BENICAR) 5 MG tablet Take 5 mg by mouth daily.   Yes [provider]  potassium chloride SA (K-DUR,KLOR-CON) 20 MEQ tablet Take 20 mEq by mouth daily. 08/14/13  Yes Gaye Pollack, MD  PREVIDENT 5000 BOOSTER PLUS 1.1 % PSTE Place onto teeth. 05/28/21  Yes [provider]  warfarin (COUMADIN) 2.5 MG tablet TAKE AS DIRECTED BY COUMADIN CLINIC. Patient taking differently: Take 2.5 mg by mouth. TAKE AS DIRECTED BY COUMADIN CLINIC.Take 2.5 mg every day except Thursdays 1 and 1/2 tablet 04/21/21  Yes Branch, Alphonse Guild, MD  dextromethorphan-guaiFENesin [facility] DM) 30-600 MG 12hr tablet Take 1 tablet by mouth 2 (two) times daily. Patient not taking: No sig reported    [provider]  doxycycline (VIBRA-TABS) 100 MG tablet Take 100 mg by mouth 2 (two) times daily. Patient not taking: No sig reported 05/26/21   [provider]  fexofenadine (ALLEGRA) 30 MG tablet Take 30 mg by mouth daily.  Patient not taking: Reported on 06/01/2021    [provider]  gabapentin (NEURONTIN) 100 MG capsule Take 1 capsule (100 mg total) by mouth 4 (four) times daily. Patient not taking: No sig reported 09/16/20   Tanda Rockers, MD  pantoprazole (PROTONIX) 40 MG tablet TAKE 1 TABLET 30 TO 60 MINUTES BEFORE FIRST MEAL OF THE DAY. Patient not taking: No sig reported 09/09/20   Tanda Rockers, MD  predniSONE (DELTASONE) 10 MG tablet Take 50 mg by mouth daily. Patient  not taking: No sig reported 05/26/21   [provider]  predniSONE (DELTASONE) 50 MG tablet One tablet a day Patient not taking: No sig reported 05/26/21   Fransico Meadow, [place]    Physical Exam: Vitals:   06/01/21 1800 06/01/21 1830 06/01/21 1900 06/01/21 1930  BP: 134/78 (!) 145/95 (!) 145/87 136/75  Pulse: (!) 108 (!) 109 (!) 110 (!) 109  Resp: '18 18 17 15  '$ Temp:      TempSrc:      SpO2: 100% 97%  98% 96%  Weight:      Height:        Physical Exam Vitals and nursing note reviewed.  Constitutional:      General: She is not in acute distress.    Appearance: She is well-developed. She is not ill-appearing, toxic-appearing or diaphoretic.  HENT:     Head: Normocephalic and atraumatic.  Eyes:     Pupils: Pupils are equal, round, and reactive to light.  Cardiovascular:     Rate and Rhythm: Regular rhythm. Tachycardia present.     Heart sounds: Murmur heard.  Pulmonary:     Breath sounds: Examination of the right-upper field reveals wheezing. Examination of the left-upper field reveals wheezing. Examination of the left-middle field reveals wheezing. Examination of the right-lower field reveals wheezing and rhonchi. Examination of the left-lower field reveals wheezing and rhonchi. Wheezing and rhonchi present. No rales.  Abdominal:     General: Bowel sounds are normal.     Palpations: Abdomen is soft.     Tenderness: There is no abdominal tenderness. There is no rebound.  Musculoskeletal:     Cervical back: Normal range of motion.  Skin:    General: Skin is warm and dry.     Capillary Refill: Capillary refill takes less than 2 seconds.  Neurological:     General: No focal deficit present.     Mental Status: She is alert and oriented to person, place, and time.     Labs on Admission: I have personally reviewed following labs and imaging studies  CBC: Recent Labs  Lab 05/27/21 0350 06/01/21 1554  WBC 5.5 9.5  NEUTROABS 3.7 8.2*  HGB 12.6 14.0  HCT 40.4 43.5   MCV 95.1 93.8  PLT 147* Q000111Q   Basic Metabolic Panel: Recent Labs  Lab 05/27/21 0350 06/01/21 1554  NA 138 132*  K 3.9 4.6  CL 103 95*  CO2 25 26  GLUCOSE 116* 262*  BUN 10 22  CREATININE 1.04* 1.05*  CALCIUM 8.7* 9.0   GFR: Estimated Creatinine Clearance: 41.1 mL/min (A) (by C-G formula based on SCr of 1.05 mg/dL (H)). Liver Function Tests: Recent Labs  Lab 06/01/21 1554  AST 24  ALT 34  ALKPHOS 59  BILITOT 0.6  PROT 7.3  ALBUMIN 4.1   No results for input(s): LIPASE, AMYLASE in the last 168 hours. No results for input(s): AMMONIA in the last 168 hours. Coagulation Profile: Recent Labs  Lab 05/27/21 1510 05/31/21 0829 06/01/21 1554  INR 2.6 3.4* 3.0*   Cardiac Enzymes: No results for input(s): CKTOTAL, CKMB, CKMBINDEX, TROPONINI in the last 168 hours. BNP (last 3 results) No results for input(s): PROBNP in the last 8760 hours. HbA1C: No results for input(s): HGBA1C in the last 72 hours. CBG: No results for input(s): GLUCAP in the last 168 hours. Lipid Profile: No results for input(s): CHOL, HDL, LDLCALC, TRIG, CHOLHDL, LDLDIRECT in the last 72 hours. Thyroid Function Tests: No results for input(s): TSH, T4TOTAL, FREET4, T3FREE, THYROIDAB in the last 72 hours. Anemia Panel: No results for input(s): VITAMINB12, FOLATE, FERRITIN, TIBC, IRON, RETICCTPCT in the last 72 hours. Urine analysis:    Component Value Date/Time   COLORURINE YELLOW 11/12/2016 0043   APPEARANCEUR HAZY (A) 11/12/2016 0043   LABSPEC 1.004 (L) 11/12/2016 0043   PHURINE 7.0 11/12/2016 [place] 11/12/2016 0043   HGBUR NEGATIVE 11/12/2016 [place] 11/12/2016 [place] 11/12/2016 0043   PROTEINUR NEGATIVE 11/12/2016 0043   UROBILINOGEN  0.2 07/07/2014 1935   NITRITE POSITIVE (A) 11/12/2016 0043   LEUKOCYTESUR MODERATE (A) 11/12/2016 0043    Radiological Exams on Admission: I have personally reviewed images CT Angio Chest PE W and/or  Wo Contrast  Result Date: 06/01/2021 CLINICAL DATA:  Worsening shortness of breath x1 week. EXAM: CT ANGIOGRAPHY CHEST WITH CONTRAST TECHNIQUE: Multidetector CT imaging of the chest was performed using the standard protocol during bolus administration of intravenous contrast. Multiplanar CT image reconstructions and MIPs were obtained to evaluate the vascular anatomy. CONTRAST:  108m OMNIPAQUE IOHEXOL 350 MG/ML SOLN COMPARISON:  March 18, 2021 FINDINGS: Cardiovascular: There is mild calcification of the aortic arch. Satisfactory opacification of the pulmonary arteries to the segmental level. No evidence of pulmonary embolism. Normal heart size. No pericardial effusion. Mediastinum/Nodes: No enlarged mediastinal, hilar, or axillary lymph nodes. Thyroid gland, trachea, and esophagus demonstrate no significant findings. Lungs/Pleura: A stable subcentimeter calcified granuloma is seen within the left lower lobe. There is no evidence of acute infiltrate, pleural effusion or pneumothorax. Pleural base calcification is seen within the posterior aspect of the left lower lobe. Upper Abdomen: No acute abnormality. Musculoskeletal: Multiple sternal wires are present. Multilevel degenerative changes seen throughout the thoracic spine. Review of the MIP images confirms the above findings. IMPRESSION: 1. No evidence of pulmonary embolism or acute cardiopulmonary disease. 2. Aortic atherosclerosis. Aortic Atherosclerosis (ICD10-I70.0). Electronically Signed   By: TVirgina NorfolkM.D.   On: 06/01/2021 19:42   DG Chest Port 1 View  Result Date: 06/01/2021 CLINICAL DATA:  Cough, productive cough and shortness of breath in a 75year old female. EXAM: PORTABLE CHEST 1 VIEW COMPARISON:  May 26, 2021. FINDINGS: EKG leads project over the chest. Signs of median sternotomy with mitral and aortic valve replacement as before. Heart size remains enlarged. Hilar structures are stable. Lungs are clear. On limited assessment there is  no acute skeletal process. IMPRESSION: Cardiomegaly without acute cardiopulmonary disease. Electronically Signed   By: GZetta BillsM.D.   On: 06/01/2021 17:10    EKG: I have personally reviewed EKG: computer reads out as aflutter. Looks more like junctional tachycardia    Assessment/Plan Principal Problem:   COPD with acute exacerbation (HCC) Active Problems:   Essential hypertension   Chronic anticoagulation   Cardioembolic stroke (HCC)   Atrial fibrillation (HCC)   Gastroesophageal reflux disease   Hyperlipidemia   Hypothyroidism   Status post aortic valve and mitral valve replacement    [place]D [place]ation [place] Admit to medical bed. Continue with IV solumedrol 60 mg q8h. Pt just completed 5 day course of prednisone taper without much success. Took po doxycycline.  Will change to po zithromax. Avoiding quinolones due to coumadin therapy. Continue with duonebs q6h. Pt need nebulizer machine for home use. CM to help arrange for nebulizer machine for home use. Will try one dose of IV mag sulfate to see if we can break her wheezing.  Essential hypertension Stable. Continue home meds.  Chronic anticoagulation On coumadin. INR trending down. On coumadin for hx of afib and cardioembolic stroke.  Card[place]embolic stroke ([place] On coumadin. Possible Left atrial thrombus seen on echo in the past.  Atrial fibrillation (HCC) Stable. On cardizem and lopressor. Continue coumadin.  Gastroesophageal reflux disease Stable.  Hyperlipidemia Stable.  Hypothyroidism Stable.  Status post aortic valve and mitral valve replacement Chronic.  DVT prophylaxis: Coumadin Code Status: Full Code Family Communication: discussed with pt and husband bob Granzow at bedside  Disposition Plan: return home  Consults called: none  Admission status: Observation, Telemetry bed   Kristopher Oppenheim, DO [facility] 06/01/2021, 8:39 PM

## 2021-06-01 NOTE — ED Provider Notes (Signed)
[facility] EMERGENCY DEPARTMENT Provider Note   CSN: [id number] Arrival date & time: 06/01/21  1340     History Chief Complaint  Patient presents with   Shortness of Breath    [name] is a 75 y.o. female.  HPI 75 year old female presents with cough and dyspnea. Has been ongoing for weeks. Was seen here and at urgent care within the last week. Cough is productive of white sputum. Was placed on antibiotics and prednisone, and just finished the steroids. No chest pain but it is tight. No fevers. Feels like her symptoms are getting worse.   Past Medical History:  Diagnosis Date   Anemia    Asthma    Atrial fibrillation ([place])    Breast carcinoma ([place]) 1995   [id number]   Cardioembolic stroke ([place]) [id number]   Right frontal in 01/2012; normal carotid ultrasound; possible LAA thrombus by TEE; virtual complete neurologic recovery   Chronic kidney disease, stage 2, mildly decreased GFR    GFR of approximately 60   Depression    Diabetes mellitus without complication ([place])    Diverticulosis of colon (without mention of hemorrhage) 2012   Dr. Laural Golden   Fasting hyperglycemia    120 fasting   Gastroesophageal reflux disease    Gastroparesis    Hemorrhoids    Hyperlipidemia    Hypertension    pt denies 05/30/13     Dr Kathe Mariner   Hyponatremia    Rheumatic heart disease    a. 07/2013 Echo: Ef 55-60%, Mod AS/AI, Mod-Sev MS, sev dil LA, PASP 58;  b. 07/2013 TEE: EF 35-40%, mild-mod AS/AI, mod-sev MS, LA smoke;  c. 07/2013 Cath: elev R heart pressures, nl cors.   Shortness of breath     Patient Active Problem List   Diagnosis Date Noted   COPD with acute exacerbation ([place]) 06/01/2021   Type 2 diabetes mellitus treated without insulin ([place]) 06/01/2021   COPD exacerbation ([place]) 06/01/2021   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   Asthma exacerbation 11/12/2016   Synovial cyst of lumbar spine 05/13/2014   Encounter for therapeutic drug monitoring 11/06/2013   Hyponatremia     Atrial fibrillation (H[facility])    Moderate aortic stenosis 07/12/2013   Moderate aortic insufficiency 07/12/2013   Status post aortic valve and mitral valve replacement 07/12/2013   Severe mitral valve stenosis 07/09/2013   Mitral stenosis 07/08/2013   Chest pain 07/07/2013   Asthma, cough variant 03/27/2013   Upper airway cough syndrome in pt with mild cough variant asthma  09/17/2012   Dyspnea on exertion 06/13/2012   Edema 06/13/2012   Rheumatic heart disease 06/13/2012   Fasting hyperglycemia    Chronic anticoagulation 01/26/2012   Hypothyroidism [id number]   Cardioembolic stroke ([place]) [id number]   Hyperlipidemia    Essential hypertension    Breast carcinoma ([place])    Gastroesophageal reflux disease     Past Surgical History:  Procedure Laterality Date   AORTIC VALVE REPLACEMENT N/A 07/18/2013   Procedure: AORTIC VALVE REPLACEMENT (AVR);  Surgeon: Gaye Pollack, MD;  Location: [place];  Service: Open Heart Surgery;  Laterality: N/A;   BACK SURGERY     BREAST LUMPECTOMY Right 1995   CARDIAC CATHETERIZATION     CARDIOVERSION N/A 07/26/2013   Procedure: CARDIOVERSION;  Surgeon: Fay Records, MD;  Location: [place];  Service: Cardiovascular;  Laterality: N/A;   COLONOSCOPY  2012   Negative screening procedure   DILATION AND CURETTAGE OF UTERUS  ESOPHAGEAL MANOMETRY N/A 06/17/2013   Procedure: ESOPHAGEAL MANOMETRY (EM);  Surgeon: Sable Feil, MD;  Location: [facility] ENDOSCOPY;  Service: Endoscopy;  Laterality: N/A;   INTRAOPERATIVE TRANSESOPHAGEAL ECHOCARDIOGRAM N/A 07/18/2013   Procedure: INTRAOPERATIVE TRANSESOPHAGEAL ECHOCARDIOGRAM;  Surgeon: Gaye Pollack, MD;  Location: [place] OR;  Service: Open Heart Surgery;  Laterality: N/A;   LEFT AND RIGHT HEART CATHETERIZATION WITH CORONARY ANGIOGRAM N/A 07/10/2013   Procedure: LEFT AND RIGHT HEART CATHETERIZATION WITH CORONARY ANGIOGRAM;  Surgeon: Burnell Blanks, MD;  Location[facility] C[facility] LAB;  Service: Cardiovascular;   Laterality: N/A;   LUMBAR LAMINECTOMY/DECOMPRESSION MICRODISCECTOMY Right 05/13/2014   Procedure: LUMBAR LAMINECTOMY/DECOMPRESSION MICRODISCECTOMY 1 LEVEL  lumbar four/five;  Surgeon: Faythe Ghee, MD;  Location: [facility] NEURO ORS;  Service: Neurosurgery;  Laterality: Right;   MITRAL VALVE REPLACEMENT N/A 07/18/2013   Procedure: MITRAL VALVE (MV) REPLACEMENT;  Surgeon: Gaye Pollack, MD;  Location: [place] OR;  Service: Open Heart Surgery;  Laterality: N/A;   MITRAL VALVE [place], closed mitral valvulotomy by finger fracture   TEE WITHOUT CARDIOVERSION  01/24/2012   Procedure: TRANSESOPHAGEAL ECHOCARDIOGRAM (TEE);  Surgeon: Lelon Perla, MD;  Location: [facility] ENDOSCOPY;  Service: Cardiovascular;  Laterality: N/A;   TEE WITHOUT CARDIOVERSION N/A 07/11/2013   Procedure: TRANSESOPHAGEAL ECHOCARDIOGRAM (TEE);  Surgeon: Thayer Headings, MD;  Location: [place];  Service: Cardiovascular;  Laterality: N/A;   TEE WITHOUT CARDIOVERSION N/A 07/26/2013   Procedure: TRANSESOPHAGEAL ECHOCARDIOGRAM (TEE);  Surgeon: Fay Records, MD;  Location: [facility] ENDOSCOPY;  Service: Cardiovascular;  Laterality: N/A;   TUBAL LIGATION  1973     OB History   No obstetric history on file.     Family History  Problem Relation Age of Onset   Heart disease Brother 39       MI   Rheum arthritis Maternal Grandmother    Asthma Maternal Grandfather    Hypothyroidism Mother    Diabetes Sister    Cirrhosis Father     Social History   Tobacco Use   Smoking status: Never   Smokeless tobacco: Never  Vaping Use   Vaping Use: Never used  Substance Use Topics   Alcohol use: No    Alcohol/week: 0.0 standard drinks   Drug use: No    Home Medications Prior to Admission medications   Medication Sig Start Date End Date Taking? Authorizing Provider  albuterol (VENTOLIN HFA) 108 (90 Base) MCG/ACT inhaler Inhale 2 puffs into the lungs every 4 (four) hours as needed for wheezing or shortness of breath.    Yes  [provider]  atorvastatin (LIPITOR) 40 MG tablet TAKE ONE TABLET BY MOUTH ONCE DAILY. 12/09/20  Yes Branch, Alphonse Guild, MD  cetirizine (ZYRTEC) 10 MG tablet Take 10 mg by mouth daily.   Yes [provider]  diltiazem (CARDIZEM CD) 180 MG 24 hr capsule Take 1 capsule (180 mg total) by mouth daily. 08/03/13  Yes Collins, Gina L, PA-C  doxycycline (VIBRAMYCIN) 100 MG capsule Take 1 capsule (100 mg total) by mouth 2 (two) times daily. 05/26/21  Yes Caryl Ada K, PA-C  escitalopram (LEXAPRO) 10 MG tablet Take 10 mg by mouth daily.  04/10/15  Yes [provider]  famotidine (PEPCID) 20 MG tablet One after supper 06/23/20  Yes Tanda Rockers, MD  FLOVENT HFA 44 MCG/ACT inhaler Inhale 2 puffs into the lungs 2 (two) times daily. 12/16/20  Yes [provider]  fluticasone (FLONASE) 50 MCG/ACT nasal spray Place 1 spray into  both nostrils daily as needed for allergies.  09/05/13  Yes Branch, Alphonse Guild, MD  furosemide (LASIX) 20 MG tablet TAKE (1) TABLET BY MOUTH ONCE DAILY FOR SWELLING. Patient taking differently: Take 20 mg by mouth daily. 09/01/20  Yes BranchAlphonse Guild, MD  levothyroxine (SYNTHROID) 100 MCG tablet Take 100 mcg by mouth daily before breakfast.  02/19/20  Yes [provider]  metFORMIN (GLUCOPHAGE) 500 MG tablet Take 1 tablet (500 mg total) by mouth 2 (two) times daily with a meal. 08/03/13  Yes Collins, Gina L, PA-C  metoprolol tartrate (LOPRESSOR) 25 MG tablet TAKE (1) TABLET BY MOUTH TWICE DAILY. Patient taking differently: Take 25 mg by mouth 2 (two) times daily. 05/05/21  Yes BranchAlphonse Guild, MD  Multiple Vitamin (MULITIVITAMIN WITH MINERALS) TABS Take 1 tablet by mouth daily.   Yes [provider]  olmesartan (BENICAR) 5 MG tablet Take 5 mg by mouth daily.   Yes [provider]  potassium chloride SA (K-DUR,KLOR-CON) 20 MEQ tablet Take 20 mEq by mouth daily. 08/14/13  Yes Gaye Pollack, MD  PREVIDENT 5000 BOOSTER PLUS 1.1  % PSTE Place onto teeth. 05/28/21  Yes [provider]  warfarin (COUMADIN) 2.5 MG tablet TAKE AS DIRECTED BY COUMADIN CLINIC. Patient taking differently: Take 2.5 mg by mouth. TAKE AS DIRECTED BY COUMADIN CLINIC.Take 2.5 mg every day except Thursdays 1 and 1/2 tablet 04/21/21  Yes Branch, Alphonse Guild, MD  dextromethorphan-guaiFENesin [facility] DM) 30-600 MG 12hr tablet Take 1 tablet by mouth 2 (two) times daily. Patient not taking: No sig reported    [provider]  doxycycline (VIBRA-TABS) 100 MG tablet Take 100 mg by mouth 2 (two) times daily. Patient not taking: No sig reported 05/26/21   [provider]  fexofenadine (ALLEGRA) 30 MG tablet Take 30 mg by mouth daily.  Patient not taking: Reported on 06/01/2021    [provider]  gabapentin (NEURONTIN) 100 MG capsule Take 1 capsule (100 mg total) by mouth 4 (four) times daily. Patient not taking: No sig reported 09/16/20   Tanda Rockers, MD  pantoprazole (PROTONIX) 40 MG tablet TAKE 1 TABLET 30 TO 60 MINUTES BEFORE FIRST MEAL OF THE DAY. Patient not taking: No sig reported 09/09/20   Tanda Rockers, MD  predniSONE (DELTASONE) 10 MG tablet Take 50 mg by mouth daily. Patient not taking: No sig reported 05/26/21   [provider]  predniSONE (DELTASONE) 50 MG tablet One tablet a day Patient not taking: No sig reported 05/26/21   Fransico Meadow, PA-C    Allergies    Amiodarone  Review of Systems   Review of Systems  Constitutional:  Negative for fever.  Respiratory:  Positive for cough, chest tightness and shortness of breath.   Cardiovascular:  Negative for chest pain.  All other systems reviewed and are negative.  Physical Exam Updated Vital Signs BP 131/70 (BP Location: Left Arm)   Pulse (!) 105   Temp 98.2 F (36.8 C) (Oral)   Resp 20   Ht '5\' 2"'$  (1.575 m)   Wt 77.2 kg   SpO2 98%   BMI 31.13 kg/m   Physical Exam Vitals and nursing note reviewed.  Constitutional:      General:  She is not in acute distress.    Appearance: She is well-developed. She is not ill-appearing or diaphoretic.  HENT:     Head: Normocephalic and atraumatic.     Right Ear: External ear normal.     Left  Ear: External ear normal.     Nose: Nose normal.  Eyes:     General:        Right eye: No discharge.        Left eye: No discharge.  Cardiovascular:     Rate and Rhythm: Tachycardia present. Rhythm irregular.     Heart sounds: Normal heart sounds.  Pulmonary:     Effort: Pulmonary effort is normal. No tachypnea, accessory muscle usage or respiratory distress.     Breath sounds: Wheezing (diffuse, expiratory) present.  Abdominal:     Palpations: Abdomen is soft.     Tenderness: There is no abdominal tenderness.  Musculoskeletal:     Right lower leg: No edema.     Left lower leg: No edema.  Skin:    General: Skin is warm and dry.  Neurological:     Mental Status: She is alert.  Psychiatric:        Mood and Affect: Mood is not anxious.    ED Results / Procedures / Treatments   Labs (all labs ordered are listed, but only abnormal results are displayed) Labs Reviewed  COMPREHENSIVE METABOLIC PANEL - Abnormal; Notable for the following components:      Result Value   Sodium 132 (*)    Chloride 95 (*)    Glucose, Bld 262 (*)    Creatinine, Ser 1.05 (*)    GFR, Estimated 55 (*)    All other components within normal limits  CBC WITH DIFFERENTIAL/PLATELET - Abnormal; Notable for the following components:   Neutro Abs 8.2 (*)    Abs Immature Granulocytes 0.08 (*)    All other components within normal limits  BRAIN NATRIURETIC PEPTIDE - Abnormal; Notable for the following components:   B Natriuretic Peptide 114.0 (*)    All other components within normal limits  PROTIME-INR - Abnormal; Notable for the following components:   Prothrombin Time 31.4 (*)    INR 3.0 (*)    All other components within normal limits  D-DIMER, QUANTITATIVE - Abnormal; Notable for the following  components:   D-Dimer, Quant 1.54 (*)    All other components within normal limits  CBG MONITORING, ED - Abnormal; Notable for the following components:   Glucose-Capillary 303 (*)    All other components within normal limits  RESP PANEL BY RT-PCR (FLU A&B, COVID) [id number]  HEMOGLOBIN A1C  CBC WITH DIFFERENTIAL/PLATELET  COMPREHENSIVE METABOLIC PANEL  MAGNESIUM  TROPONIN I (HIGH SENSITIVITY)  TROPONIN I (HIGH SENSITIVITY)    EKG EKG Interpretation  Date/Time:  Tuesday June 01 2021 16:16:27 EDT Ventricular Rate:  105 PR Interval:  45 QRS Duration: 97 QT Interval:  375 QTC Calculation: 496 R Axis:   101 Text Interpretation: Atrial flutter Right axis deviation Borderline ST depression, diffuse leads Borderline prolonged QT interval Confirmed by Sherwood Gambler [phone]) on 06/01/2021 4:22:59 PM  Radiology CT Angio Chest PE W and/or Wo Contrast  Result Date: 06/01/2021 CLINICAL DATA:  Worsening shortness of breath x1 week. EXAM: CT ANGIOGRAPHY CHEST WITH CONTRAST TECHNIQUE: Multidetector CT imaging of the chest was performed using the standard protocol during bolus administration of intravenous contrast. Multiplanar CT image reconstructions and MIPs were obtained to evaluate the vascular anatomy. CONTRAST:  28m OMNIPAQUE IOHEXOL 350 MG/ML SOLN COMPARISON:  March 18, 2021 FINDINGS: Cardiovascular: There is mild calcification of the aortic arch. Satisfactory opacification of the pulmonary arteries to the segmental level. No evidence of pulmonary embolism. Normal heart size. No pericardial effusion. Mediastinum/Nodes: No enlarged mediastinal, hilar,  or axillary lymph nodes. Thyroid gland, trachea, and esophagus demonstrate no significant findings. Lungs/Pleura: A stable subcentimeter calcified granuloma is seen within the left lower lobe. There is no evidence of acute infiltrate, pleural effusion or pneumothorax. Pleural base calcification is seen within the posterior aspect of the left lower  lobe. Upper Abdomen: No acute abnormality. Musculoskeletal: Multiple sternal wires are present. Multilevel degenerative changes seen throughout the thoracic spine. Review of the MIP images confirms the above findings. IMPRESSION: 1. No evidence of pulmonary embolism or acute cardiopulmonary disease. 2. Aortic atherosclerosis. Aortic Atherosclerosis (ICD10-I70.0). Electronically Signed   By: Virgina Norfolk M.D.   On: 06/01/2021 19:42   DG Chest Port 1 View  Result Date: 06/01/2021 CLINICAL DATA:  Cough, productive cough and shortness of breath in a 75 year old female. EXAM: PORTABLE CHEST 1 VIEW COMPARISON:  May 26, 2021. FINDINGS: EKG leads project over the chest. Signs of median sternotomy with mitral and aortic valve replacement as before. Heart size remains enlarged. Hilar structures are stable. Lungs are clear. On limited assessment there is no acute skeletal process. IMPRESSION: Cardiomegaly without acute cardiopulmonary disease. Electronically Signed   By: Zetta Bills M.D.   On: 06/01/2021 17:10    Procedures Procedures   Medications Ordered in ED Medications  atorvastatin (LIPITOR) tablet 40 mg (40 mg Oral Given 06/01/21 2232)  diltiazem (CARDIZEM CD) 24 hr capsule 180 mg (has no administration in time range)  furosemide (LASIX) tablet 20 mg (has no administration in time range)  metoprolol tartrate (LOPRESSOR) tablet 25 mg (25 mg Oral Given 06/01/21 2149)  irbesartan (AVAPRO) tablet 37.5 mg (has no administration in time range)  levothyroxine (SYNTHROID) tablet 100 mcg (has no administration in time range)  famotidine (PEPCID) tablet 20 mg (has no administration in time range)  potassium chloride SA (KLOR-CON) CR tablet 20 mEq (has no administration in time range)  loratadine (CLARITIN) tablet 10 mg (has no administration in time range)  insulin aspart (novoLOG) injection 0-5 Units (4 Units Subcutaneous Given 06/01/21 2244)  insulin aspart (novoLOG) injection 0-15 Units (has no  administration in time range)  ipratropium-albuterol (DUONEB) 0.5-2.5 (3) MG/3ML nebulizer solution 3 mL (3 mLs Nebulization Given 06/01/21 2127)  azithromycin (ZITHROMAX) tablet 500 mg (500 mg Oral Given 06/01/21 2231)  acetaminophen (TYLENOL) tablet 650 mg (has no administration in time range)    Or  acetaminophen (TYLENOL) suppository 650 mg (has no administration in time range)  ondansetron (ZOFRAN) tablet 4 mg (has no administration in time range)    Or  ondansetron (ZOFRAN) injection 4 mg (has no administration in time range)  melatonin tablet 5 mg (has no administration in time range)  guaiFENesin (MUCINEX) 12 hr tablet 600 mg (600 mg Oral Given 06/01/21 2149)  methylPREDNISolone sodium succinate (SOLU-MEDROL) 125 mg/2 mL injection 90 mg (has no administration in time range)    Followed by  predniSONE (DELTASONE) tablet 40 mg (has no administration in time range)  albuterol (VENTOLIN HFA) 108 (90 Base) MCG/ACT inhaler 4 puff (4 puffs Inhalation Given 06/01/21 1618)  methylPREDNISolone sodium succinate (SOLU-MEDROL) 125 mg/2 mL injection 125 mg (125 mg Intravenous Given 06/01/21 1712)  ipratropium-albuterol (DUONEB) 0.5-2.5 (3) MG/3ML nebulizer solution 3 mL (3 mLs Nebulization Given 06/01/21 1743)  albuterol (PROVENTIL) (2.5 MG/3ML) 0.083% nebulizer solution 2.5 mg (2.5 mg Nebulization Given 06/01/21 1743)  iohexol (OMNIPAQUE) 350 MG/ML injection 100 mL (80 mLs Intravenous Contrast Given 06/01/21 1911)  magnesium sulfate IVPB 2 g 50 mL (0 g Intravenous Stopped 06/01/21 2245)  warfarin (COUMADIN)  tablet 2.5 mg (2.5 mg Oral Given 06/01/21 2232)    ED Course  I have reviewed the triage vital signs and the nursing notes.  Pertinent labs & imaging results that were available during my care of the patient were reviewed by me and considered in my medical decision making (see chart for details).    MDM Rules/Calculators/A&P                           Patient is still wheezing despite multiple  albuterol treatments.  Given her tachycardia and prolonged symptoms a CTA was obtained and shows no obvious PE.  No pneumonia.  I think she will need further COPD supportive care.  She was given Solu-Medrol.  Dr. Bridgett Larsson to admit. Final Clinical Impression(s) / ED Diagnoses Final diagnoses:  COPD exacerbation [facility]    Rx / DC Orders ED Discharge Orders     None        Sherwood Gambler, MD 06/01/21 [phone]

## 2021-06-01 NOTE — ED Notes (Signed)
Patient is resting comfortably. 

## 2021-06-01 NOTE — Assessment & Plan Note (Signed)
Add SSI. Check a1c.

## 2021-06-01 NOTE — ED Notes (Signed)
Charge nurse notified that report has been attempted to be called x2 in 30 minute increments without success.

## 2021-06-01 NOTE — Assessment & Plan Note (Signed)
Stable. On cardizem and lopressor. Continue coumadin.

## 2021-06-01 NOTE — Assessment & Plan Note (Signed)
Stable.  Continue home meds. ?

## 2021-06-01 NOTE — Subjective & Objective (Signed)
CC: SOB, cough, wheezing HPI: 75 year old female with a history of COPD (lifelong non-smoker), hypertension, A. fib, chronic anticoagulation on Coumadin, history of mitral and aortic valve replacement, reflux, hyperlipidemia who presents with her husband today for a 1 week history of worsening shortness of breath.  Patient recently completed a prednisone taper today.  She was on 5 days worth of prednisone.  She is also p.o. doxycycline.  Majority history was provided by the patient's husband.  He states that patient had a stroke several years ago and her memory is quite poor.  Husband states the patient was having shortness of breath and cough this past week.  Was seen by her primary care physician.  Placed on doxycycline and p.o. prednisone.  Husband states that her breathing improved only for the first day on prednisone.  Patient been using an albuterol inhaler at home.  She also uses inhaled Flovent.  Patient does not have a home nebulizer machine.  Patient's been having worsening cough.  She has had shortness of breath at rest.  Due to the patient's wheezing at home and worsening shortness of breath, patient brought into the ER for evaluation.  In the ER, patient noted to be afebrile.  Room air saturations 93%.  Patient is COVID-negative.  BNP was stable at 114.  INR slightly elevated at 3.0.  Yesterday was 3.4.  CT of the chest was negative for acute PE.  Patient received multiple rounds of albuterol.  She also received IV Solu-Medrol.  Patient was still wheezing.  She was still coughing and feeling short of breath.  Due to the patient's unimproved respiratory status, acute COPD exacerbation, triad hospitalist contacted for admission.

## 2021-06-01 NOTE — ED Notes (Signed)
This nurse placed call to Acute Care to give report to [facility], receptionist transferred this nurse to her, received no answer, receptionist reports she will have nurse return call to ED.

## 2021-06-01 NOTE — Assessment & Plan Note (Addendum)
Admit to medical bed. Continue with IV solumedrol 60 mg q8h. Pt just completed 5 day course of prednisone taper without much success. Took po doxycycline.  Will change to po zithromax. Avoiding quinolones due to coumadin therapy. Continue with duonebs q6h. Pt need nebulizer machine for home use. CM to help arrange for nebulizer machine for home use. Will try one dose of IV mag sulfate to see if we can break her wheezing.

## 2021-06-01 NOTE — Assessment & Plan Note (Addendum)
On coumadin. Possible Left atrial thrombus seen on echo in the past.

## 2021-06-02 DIAGNOSIS — I4891 Unspecified atrial fibrillation: Secondary | ICD-10-CM | POA: Diagnosis not present

## 2021-06-02 DIAGNOSIS — E119 Type 2 diabetes mellitus without complications: Secondary | ICD-10-CM

## 2021-06-02 DIAGNOSIS — J441 Chronic obstructive pulmonary disease with (acute) exacerbation: Secondary | ICD-10-CM | POA: Diagnosis not present

## 2021-06-02 DIAGNOSIS — E7800 Pure hypercholesterolemia, unspecified: Secondary | ICD-10-CM

## 2021-06-02 DIAGNOSIS — Z7901 Long term (current) use of anticoagulants: Secondary | ICD-10-CM | POA: Diagnosis not present

## 2021-06-02 DIAGNOSIS — E038 Other specified hypothyroidism: Secondary | ICD-10-CM

## 2021-06-02 DIAGNOSIS — I10 Essential (primary) hypertension: Secondary | ICD-10-CM | POA: Diagnosis not present

## 2021-06-02 LAB — COMPREHENSIVE METABOLIC PANEL
ALT: 29 U/L (ref 0–44)
AST: 21 U/L (ref 15–41)
Albumin: 3.7 g/dL (ref 3.5–5.0)
Alkaline Phosphatase: 53 U/L (ref 38–126)
Anion gap: 13 (ref 5–15)
BUN: 17 mg/dL (ref 8–23)
CO2: 25 mmol/L (ref 22–32)
Calcium: 8.9 mg/dL (ref 8.9–10.3)
Chloride: 97 mmol/L — ABNORMAL LOW (ref 98–111)
Creatinine, Ser: 0.84 mg/dL (ref 0.44–1.00)
GFR, Estimated: >60 mL/min (ref >60)
Glucose, Bld: 249 mg/dL — ABNORMAL HIGH (ref 70–99)
Potassium: 4.2 mmol/L (ref 3.5–5.1)
Sodium: 135 mmol/L (ref 135–145)
Total Bilirubin: 0.5 mg/dL (ref 0.3–1.2)
Total Protein: 6.6 g/dL (ref 6.5–8.1)

## 2021-06-02 LAB — MAGNESIUM: Magnesium: 2.4 mg/dL (ref 1.7–2.4)

## 2021-06-02 LAB — CBC WITH DIFFERENTIAL/PLATELET
Abs Immature Granulocytes: 0.07 10*3/uL (ref 0.00–0.07)
Basophils Absolute: 0 10*3/uL (ref 0.0–0.1)
Basophils Relative: 0 %
Eosinophils Absolute: 0 10*3/uL (ref 0.0–0.5)
Eosinophils Relative: 0 %
HCT: 42.5 % (ref 36.0–46.0)
Hemoglobin: 13.4 g/dL (ref 12.0–15.0)
Immature Granulocytes: 1 %
Lymphocytes Relative: 9 %
Lymphs Abs: 0.7 10*3/uL (ref 0.7–4.0)
MCH: 29.3 pg (ref 26.0–34.0)
MCHC: 31.5 g/dL (ref 30.0–36.0)
MCV: 92.8 fL (ref 80.0–100.0)
Monocytes Absolute: 0.3 10*3/uL (ref 0.1–1.0)
Monocytes Relative: 3 %
Neutro Abs: 7.4 10*3/uL (ref 1.7–7.7)
Neutrophils Relative %: 87 %
Platelets: 151 10*3/uL (ref 150–400)
RBC: 4.58 MIL/uL (ref 3.87–5.11)
RDW: 14 % (ref 11.5–15.5)
WBC: 8.4 10*3/uL (ref 4.0–10.5)
nRBC: 0 % (ref 0.0–0.2)

## 2021-06-02 LAB — GLUCOSE, CAPILLARY
Glucose-Capillary: 244 mg/dL — ABNORMAL HIGH (ref 70–99)
Glucose-Capillary: 307 mg/dL — ABNORMAL HIGH (ref 70–99)
Glucose-Capillary: 229 mg/dL — ABNORMAL HIGH (ref 70–99)
Glucose-Capillary: 291 mg/dL — ABNORMAL HIGH (ref 70–99)

## 2021-06-02 LAB — PROTIME-INR
INR: 3.1 — ABNORMAL HIGH (ref 0.8–1.2)
Prothrombin Time: 31.8 seconds — ABNORMAL HIGH (ref 11.4–15.2)

## 2021-06-02 MED ORDER — WARFARIN - PHARMACIST DOSING INPATIENT: Freq: Every day | Status: DC

## 2021-06-02 MED ORDER — WARFARIN SODIUM 2.5 MG PO TABS
2.5000 mg | ORAL_TABLET | Freq: Once | ORAL | Status: AC
  Administered 2021-06-02: 2.5 mg via ORAL
  Filled 2021-06-02: qty 1

## 2021-06-02 MED ORDER — GUAIFENESIN-CODEINE 100-10 MG/5ML PO SOLN
10.0000 mL | Freq: Four times a day (QID) | ORAL | Status: DC | PRN
  Administered 2021-06-02 – 2021-06-04 (×3): 10 mL via ORAL
  Filled 2021-06-02 (×3): qty 10

## 2021-06-02 MED ORDER — IPRATROPIUM-ALBUTEROL 0.5-2.5 (3) MG/3ML IN SOLN
3.0000 mL | Freq: Three times a day (TID) | RESPIRATORY_TRACT | Status: DC
  Administered 2021-06-03: 3 mL via RESPIRATORY_TRACT
  Filled 2021-06-02 (×2): qty 3

## 2021-06-02 MED ORDER — IPRATROPIUM-ALBUTEROL 0.5-2.5 (3) MG/3ML IN SOLN: 3.0000 mL | RESPIRATORY_TRACT | Status: DC | PRN

## 2021-06-02 MED ORDER — MELATONIN 3 MG PO TABS: 6.0000 mg | ORAL_TABLET | Freq: Every evening | ORAL | Status: DC | PRN

## 2021-06-02 MED ORDER — METOPROLOL TARTRATE 5 MG/5ML IV SOLN: 5.0000 mg | Freq: Four times a day (QID) | INTRAVENOUS | Status: DC | PRN

## 2021-06-02 NOTE — Progress Notes (Signed)
PROGRESS NOTE    [name]  [id number] DOB: [birth year] DOA: 06/01/2021 PCP: [name], MD   Brief Narrative:  75 year old WF PMHx  COPD (lifelong non-smoker), hypertension, A. fib, chronic anticoagulation on Coumadin, history of mitral and aortic valve replacement, reflux, hyperlipidemia   Presents with her husband today for a 1 week history of worsening shortness of breath.  Patient recently completed a prednisone taper today.  She was on 5 days worth of prednisone.  She is also p.o. doxycycline.  Majority history was provided by the patient's husband.  He states that patient had a stroke several years ago and her memory is quite poor.  Husband states the patient was having shortness of breath and cough this past week.  Was seen by her primary care physician.  Placed on doxycycline and p.o. prednisone.  Husband states that her breathing improved only for the first day on prednisone.  Patient been using an albuterol inhaler at home.  She also uses inhaled Flovent.  Patient does not have a home nebulizer machine.  Patient's been having worsening cough.  She has had shortness of breath at rest.  Due to the patient's wheezing at home and worsening shortness of breath, patient brought into the ER for evaluation.  In the ER, patient noted to be afebrile.  Room air saturations 93%.  Patient is COVID-negative.  BNP was stable at 114.  INR slightly elevated at 3.0.  Yesterday was 3.4.  CT of the chest was negative for acute PE.   Patient received multiple rounds of albuterol.  She also received IV Solu-Medrol.  Patient was still wheezing.  She was still coughing and feeling short of breath.   Due to the patient's unimproved respiratory status, acute COPD exacerbation, [facility]ad hospitalist contacted for admission.    Subjective: Afebrile overnight, A. fib RVR.  Per patient and husband patient was diagnosed with asthma years ago when she went for heart surgery at [facility].  Usually  controlled only requires occasional inhaler.     Assessment & Plan:  Covid vaccination; vaccinated 4/4  Principal Problem:   COPD with acute exacerbation ([place]) Active Problems:   Gastroesophageal reflux disease   Hyperlipidemia   Essential hypertension   Hypothyroidism   Chronic anticoagulation   Cardioembolic stroke ([place])   Atrial fibrillation (HCC)   Status post aortic valve and mitral valve replacement   Type 2 diabetes mellitus treated without insulin (HCC)   COPD exacerbation (HCC)   COPD with acute exacerbation ([place]) Admit to medical bed. Continue with IV solumedrol 60 mg q8h. Pt just completed 5 day course of prednisone taper without much success. Took po doxycycline.  Will change to po zithromax. Avoiding quinolones due to coumadin therapy. Continue with duonebs q6h. Pt need nebulizer machine for home use. CM to help arrange for nebulizer machine for home use. Will try one dose of IV mag sulfate to see if we can break her wheezing.   Essential hypertension Stable. Continue home meds.   Chronic anticoagulation On coumadin. INR trending down. On coumadin for hx of afib and cardioembolic stroke.   Cardioembolic stroke ([place]) On coumadin. Possible Left atrial thrombus seen on echo in the past.   A. fib RVR -Unstable most likely secondary to COPD exacerbation - Cardizem 180 mg daily - Lasix 20 mg daily - 9/1 increase metoprolol 37.5 mg BID -Coumadin per pharmacy  Lab Results  Component Value Date   INR 3.8 (H) 06/03/2021   INR 3.1 (H) 06/02/2021   INR  3.0 (H) 06/01/2021   INR 3.4 (A) 05/31/2021   INR 2.6 05/27/2021    Gastroesophageal reflux disease Stable.   DM type II uncontrolled with hyperglycemia - Moderate SSI    Hyperlipidemia Stable.   Hypothyroidism Stable.   Status post aortic valve and mitral valve replacement Chronic.   DVT prophylaxis: Coumadin Code Status: Full Family Communication: 8/31 husband at bedside for discussion of plan of  care all questions addressed Status is: Inpatient    Dispo: The patient is from: Home              Anticipated d/c is to: Home              Anticipated d/c date is: 1 day              Patient currently is not medically stable to d/c.      Consultants:    Procedures/Significant Events:     I have personally reviewed and interpreted all radiology studies and my findings are as above.  VENTILATOR SETTINGS:    Cultures   Antimicrobials:    Devices    LINES / TUBES:      Continuous Infusions:   Objective: Vitals:   06/02/21 0746 06/02/21 0813 06/02/21 0900 06/02/21 0930  BP: 134/76     Pulse: (!) 109  (!) 111 (!) 110  Resp: 20     Temp: 98.2 F (36.8 C)     TempSrc: Oral     SpO2: 96% 95% 98%   Weight:      Height:       No intake or output data in the 24 hours ending 06/02/21 0932 Filed Weights   06/01/21 1358 06/01/21 2300  Weight: 68 kg 77.2 kg    Examination:  General: A/O x4, positive acute respiratory distress Eyes: negative scleral hemorrhage, negative anisocoria, negative icterus ENT: Negative Runny nose, negative gingival bleeding, Neck:  Negative scars, masses, torticollis, lymphadenopathy, JVD Lungs: diffuse expiratory wheeze, negative crackles Cardiovascular: Irregularly irregular rhythm and rate without murmur gallop or rub normal S1 and S2 Abdomen: negative abdominal pain, nondistended, positive soft, bowel sounds, no rebound, no ascites, no appreciable mass Extremities: No significant cyanosis, clubbing, or edema bilateral lower extremities Skin: Negative rashes, lesions, ulcers Psychiatric:  Negative depression, negative anxiety, negative fatigue, negative mania  Central nervous system:  Cranial nerves II through XII intact, tongue/uvula midline, all extremities muscle strength 5/5, sensation intact throughout, negative dysarthria, negative expressive aphasia, negative receptive aphasia.  .     Data Reviewed: Care during the  described time interval was provided by me .  I have reviewed this patient's available data, including medical history, events of note, physical examination, and all test results as part of my evaluation.   CBC: Recent Labs  Lab 05/27/21 0350 06/01/21 1554 06/02/21 0459  WBC 5.5 9.5 8.4  NEUTROABS 3.7 8.2* 7.4  HGB 12.6 14.0 13.4  HCT 40.4 43.5 42.5  MCV 95.1 93.8 92.8  PLT 147* 184 [id number]   Basic Metabolic Panel: Recent Labs  Lab 05/27/21 0350 06/01/21 1554 06/02/21 0459  NA 138 132* 135  K 3.9 4.6 4.2  CL 103 95* 97*  CO2 '25 26 25  '$ GLUCOSE 116* 262* 249*  BUN '10 22 17  '$ CREATININE 1.04* 1.05* 0.84  CALCIUM 8.7* 9.0 8.9  MG  --   --  2.4   GFR: Estimated Creatinine Clearance: 53.9 mL/min (by C-G formula based on SCr of 0.84 mg/dL). Liver Function Tests: Recent Labs  Lab 06/01/21 1554 06/02/21 0459  AST 24 21  ALT 34 29  ALKPHOS 59 53  BILITOT 0.6 0.5  PROT 7.3 6.6  ALBUMIN 4.1 3.7   No results for input(s): LIPASE, AMYLASE in the last 168 hours. No results for input(s): AMMONIA in the last 168 hours. Coagulation Profile: Recent Labs  Lab 05/27/21 1510 05/31/21 0829 06/01/21 1554 06/02/21 0748  INR 2.6 3.4* 3.0* 3.1*   Cardiac Enzymes: No results for input(s): CKTOTAL, CKMB, CKMBINDEX, TROPONINI in the last 168 hours. BNP (last 3 results) No results for input(s): PROBNP in the last 8760 hours. HbA1C: No results for input(s): HGBA1C in the last 72 hours. CBG: Recent Labs  Lab 06/01/21 2235 06/02/21 0726  GLUCAP 303* 244*   Lipid Profile: No results for input(s): CHOL, HDL, LDLCALC, TRIG, CHOLHDL, LDLDIRECT in the last 72 hours. Thyroid Function Tests: No results for input(s): TSH, T4TOTAL, FREET4, T3FREE, THYROIDAB in the last 72 hours. Anemia Panel: No results for input(s): VITAMINB12, FOLATE, FERRITIN, TIBC, IRON, RETICCTPCT in the last 72 hours. Urine analysis:    Component Value Date/Time   COLORURINE YELLOW 11/12/2016 0043   APPEARANCEUR  HAZY (A) 11/12/2016 0043   LABSPEC 1.004 (L) 11/12/2016 0043   PHURINE 7.0 11/12/2016 0043   GLUCOSEU NEGATIVE 11/12/2016 0043   HGBUR NEGATIVE 11/12/2016 0043   BILIRUBINUR NEGATIVE 11/12/2016 0043   KETONESUR NEGATIVE 11/12/2016 0043   PROTEINUR NEGATIVE 11/12/2016 0043   UROBILINOGEN 0.2 07/07/2014 1935   NITRITE POSITIVE (A) 11/12/2016 0043   LEUKOCYTESUR MODERATE (A) 11/12/2016 0043   Sepsis Labs: '@LABRCNTIP'$ (procalcitonin:4,lacticidven:4)  ) Recent Results (from the past 240 hour(s))  Covid-19, Flu A+B ([facility])     Status: None   Collection Time: 05/26/21 10:13 AM   Specimen: Nasal Swab; Nasopharyngeal   Naso  Patient immune s  Result Value Ref Range Status   SARS-CoV-2, NAA Not Detected Not Detected Final   Influenza A, NAA Not Detected Not Detected Final   Influenza B, NAA Not Detected Not Detected Final   Test Information: Comment  Final    Comment: This nucleic acid amplification test was developed and its performance characteristics determined by [employer], [employer]. Nucleic acid amplification tests include RT-PCR and TMA. This test has not been FDA cleared or approved. This test has been authorized by FDA under an Emergency Use Authorization (EUA). This test is only authorized for the duration of time the declaration that circumstances exist justifying the authorization of the emergency use of in vitro diagnostic tests for detection of SARS-CoV-2 virus and/or diagnosis of COVID-19 infection under section 564(b)(1) of the Act, 21 U.S.C. [id number]) (1), unless the authorization is terminated or revoked sooner. When diagnostic testing is negative, the possibility of a false negative result should be considered in the context of a patient's recent exposures and the presence of clinical signs and symptoms consistent with COVID-19. An individual without symptoms of COVID-19 and who is not shedding SARS-CoV-2 virus wo uld expect to have a negative (not detected)  result in this assay.   Resp Panel by RT-PCR (Flu A&B, Covid) Nasopharyngeal Swab     Status: None   Collection Time: 05/27/21  3:32 AM   Specimen: Nasopharyngeal Swab; Nasopharyngeal(NP) swabs in vial transport medium  Result Value Ref Range Status   SARS Coronavirus 2 by RT PCR NEGATIVE NEGATIVE Final    Comment: (NOTE) SARS-CoV-2 target nucleic acids are NOT DETECTED.  The SARS-CoV-2 RNA is generally detectable in upper respiratory specimens during the acute phase of infection. The  lowest concentration of SARS-CoV-2 viral copies this assay can detect is 138 copies/mL. A negative result does not preclude SARS-Cov-2 infection and should not be used as the sole basis for treatment or other patient management decisions. A negative result may occur with  improper specimen collection/handling, submission of specimen other than nasopharyngeal swab, presence of viral mutation(s) within the areas targeted by this assay, and inadequate number of viral copies(<138 copies/mL). A negative result must be combined with clinical observations, patient history, and epidemiological information. The expected result is Negative.  Fact Sheet for Patients:  EntrepreneurPulse.com.au  Fact Sheet for Healthcare Providers:  IncredibleEmployment.be  This test is no t yet approved or cleared by the [place] FDA and  has been authorized for detection and/or diagnosis of SARS-CoV-2 by FDA under an Emergency Use Authorization (EUA). This EUA will remain  in effect (meaning this test can be used) for the duration of the COVID-19 declaration under Section 564(b)(1) of the Act, 21 U.S.C.section 360bbb-3(b)(1), unless the authorization is terminated  or revoked sooner.       Influenza A by PCR NEGATIVE NEGATIVE Final   Influenza B by PCR NEGATIVE NEGATIVE Final    Comment: (NOTE) The Xpert Xpress SARS-CoV-2/FLU/RSV plus assay is intended as an aid in the diagnosis of  influenza from Nasopharyngeal swab specimens and should not be used as a sole basis for treatment. Nasal washings and aspirates are unacceptable for Xpert Xpress SARS-CoV-2/FLU/RSV testing.  Fact Sheet for Patients: EntrepreneurPulse.com.au  Fact Sheet for Healthcare Providers: IncredibleEmployment.be  This test is not yet approved or cleared by the [place] FDA and has been authorized for detection and/or diagnosis of SARS-CoV-2 by FDA under an Emergency Use Authorization (EUA). This EUA will remain in effect (meaning this test can be used) for the duration of the COVID-19 declaration under Section 564(b)(1) of the Act, 21 U.S.C. section 360bbb-3(b)(1), unless the authorization is terminated or revoked.  Performed at [facility], [address] [place], [place] [address]   Resp Panel by RT-PCR (Flu A&B, Covid) Nasopharyngeal Swab     Status: None   Collection Time: 06/01/21  3:37 PM   Specimen: Nasopharyngeal Swab; Nasopharyngeal(NP) swabs in vial transport medium  Result Value Ref Range Status   SARS Coronavirus 2 by RT PCR NEGATIVE NEGATIVE Final    Comment: (NOTE) SARS-CoV-2 target nucleic acids are NOT DETECTED.  The SARS-CoV-2 RNA is generally detectable in upper respiratory specimens during the acute phase of infection. The lowest concentration of SARS-CoV-2 viral copies this assay can detect is 138 copies/mL. A negative result does not preclude SARS-Cov-2 infection and should not be used as the sole basis for treatment or other patient management decisions. A negative result may occur with  improper specimen collection/handling, submission of specimen other than nasopharyngeal swab, presence of viral mutation(s) within the areas targeted by this assay, and inadequate number of viral copies(<138 copies/mL). A negative result must be combined with clinical observations, patient history, and epidemiological information. The  expected result is Negative.  Fact Sheet for Patients:  EntrepreneurPulse.com.au  Fact Sheet for Healthcare Providers:  IncredibleEmployment.be  This test is no t yet approved or cleared by the [place] FDA and  has been authorized for detection and/or diagnosis of SARS-CoV-2 by FDA under an Emergency Use Authorization (EUA). This EUA will remain  in effect (meaning this test can be used) for the duration of the COVID-19 declaration under Section 564(b)(1) of the Act, 21 U.S.C.section 360bbb-3(b)(1), unless the authorization is terminated  or revoked sooner.       Influenza A by PCR NEGATIVE NEGATIVE Final   Influenza B by PCR NEGATIVE NEGATIVE Final    Comment: (NOTE) The Xpert Xpress SARS-CoV-2/FLU/RSV plus assay is intended as an aid in the diagnosis of influenza from Nasopharyngeal swab specimens and should not be used as a sole basis for treatment. Nasal washings and aspirates are unacceptable for Xpert Xpress SARS-CoV-2/FLU/RSV testing.  Fact Sheet for Patients: EntrepreneurPulse.com.au  Fact Sheet for Healthcare Providers: IncredibleEmployment.be  This test is not yet approved or cleared by the [place] FDA and has been authorized for detection and/or diagnosis of SARS-CoV-2 by FDA under an Emergency Use Authorization (EUA). This EUA will remain in effect (meaning this test can be used) for the duration of the COVID-19 declaration under Section 564(b)(1) of the Act, 21 U.S.C. section 360bbb-3(b)(1), unless the authorization is terminated or revoked.  Performed at [facility], [address], [place], [place] [address]          Radiology Studies: CT Angio Chest PE W and/or Wo Contrast  Result Date: 06/01/2021 CLINICAL DATA:  Worsening shortness of breath x1 week. EXAM: CT ANGIOGRAPHY CHEST WITH CONTRAST TECHNIQUE: Multidetector CT imaging of the chest was performed using the  standard protocol during bolus administration of intravenous contrast. Multiplanar CT image reconstructions and MIPs were obtained to evaluate the vascular anatomy. CONTRAST:  62m OMNIPAQUE IOHEXOL 350 MG/ML SOLN COMPARISON:  March 18, 2021 FINDINGS: Cardiovascular: There is mild calcification of the aortic arch. Satisfactory opacification of the pulmonary arteries to the segmental level. No evidence of pulmonary embolism. Normal heart size. No pericardial effusion. Mediastinum/Nodes: No enlarged mediastinal, hilar, or axillary lymph nodes. Thyroid gland, trachea, and esophagus demonstrate no significant findings. Lungs/Pleura: A stable subcentimeter calcified granuloma is seen within the left lower lobe. There is no evidence of acute infiltrate, pleural effusion or pneumothorax. Pleural base calcification is seen within the posterior aspect of the left lower lobe. Upper Abdomen: No acute abnormality. Musculoskeletal: Multiple sternal wires are present. Multilevel degenerative changes seen throughout the thoracic spine. Review of the MIP images confirms the above findings. IMPRESSION: 1. No evidence of pulmonary embolism or acute cardiopulmonary disease. 2. Aortic atherosclerosis. Aortic Atherosclerosis (ICD10-I70.0). Electronically Signed   By: TVirgina NorfolkM.D.   On: 06/01/2021 19:42   DG Chest Port 1 View  Result Date: 06/01/2021 CLINICAL DATA:  Cough, productive cough and shortness of breath in a 75year old female. EXAM: PORTABLE CHEST 1 VIEW COMPARISON:  May 26, 2021. FINDINGS: EKG leads project over the chest. Signs of median sternotomy with mitral and aortic valve replacement as before. Heart size remains enlarged. Hilar structures are stable. Lungs are clear. On limited assessment there is no acute skeletal process. IMPRESSION: Cardiomegaly without acute cardiopulmonary disease. Electronically Signed   By: GZetta BillsM.D.   On: 06/01/2021 17:10        Scheduled Meds:  atorvastatin  40  mg Oral Daily   azithromycin  500 mg Oral Daily   diltiazem  180 mg Oral Daily   famotidine  20 mg Oral BID   furosemide  20 mg Oral Daily   guaiFENesin  600 mg Oral BID   insulin aspart  0-15 Units Subcutaneous TID WC   insulin aspart  0-5 Units Subcutaneous QHS   ipratropium-albuterol  3 mL Nebulization Q6H   irbesartan  37.5 mg Oral Daily   levothyroxine  100 mcg Oral QAC breakfast   loratadine  10 mg Oral Daily  methylPREDNISolone (SOLU-MEDROL) injection  90 mg Intravenous Q12H   Followed by   Derrill Memo ON 06/03/2021] predniSONE  40 mg Oral Q breakfast   metoprolol tartrate  25 mg Oral BID   potassium chloride SA  20 mEq Oral Daily   warfarin  2.5 mg Oral ONCE-1600   Warfarin - Pharmacist Dosing Inpatient   Does not apply q1600   Continuous Infusions:   LOS: 0 days   The patient is critically ill with multiple organ systems failure and requires high complexity decision making for assessment and support, frequent evaluation and titration of therapies, application of advanced monitoring technologies and extensive interpretation of multiple databases. Critical Care Time devoted to patient care services described in this note  Time spent: 40 minutes     Abbiegail Landgren, Geraldo Docker, MD [facility]   If 7PM-7AM, please contact night-coverage 06/02/2021, 9:32 AM

## 2021-06-02 NOTE — Progress Notes (Signed)
   06/02/21 0900  Assess: MEWS Score  Pulse Rate (!) 111  Level of Consciousness Alert  SpO2 98 %  O2 Device Room Air  Assess: MEWS Score  MEWS Temp 0  MEWS Systolic 0  MEWS Pulse 2  MEWS RR 0  MEWS LOC 0  MEWS Score 2  MEWS Score Color Yellow  Take Vital Signs  Increase Vital Sign Frequency  Yellow: Q 2hr X 2 then Q 4hr X 2, if remains yellow, continue Q 4hrs  Notify: Charge Nurse/RN  Name of Charge Nurse/RN Notified Engineer, materials  Date Charge Nurse/RN Notified 06/02/21  Time Charge Nurse/RN Notified 0900

## 2021-06-02 NOTE — Progress Notes (Signed)
ANTICOAGULATION CONSULT NOTE -   Pharmacy Consult for warfarin Indication: atrial fibrillation and stroke Mitral and aortic valve replacements   Allergies  Allergen Reactions   Amiodarone Rash    Patient Measurements: Height: '5\' 2"'$  (157.5 cm) Weight: 77.2 kg (170 lb 3.1 oz) IBW/kg (Calculated) : 50.1  Vital Signs: Temp: 98.2 F (36.8 C) (08/31 0746) Temp Source: Oral (08/31 0746) BP: 134/76 (08/31 0746) Pulse Rate: 109 (08/31 0746)  Labs: Recent Labs    05/31/21 0829 06/01/21 1554 06/01/21 1742 06/02/21 0459 06/02/21 0748  HGB  --  14.0  --  13.4  --   HCT  --  43.5  --  42.5  --   PLT  --  184  --  151  --   LABPROT  --  31.4*  --   --  31.8*  INR 3.4* 3.0*  --   --  3.1*  CREATININE  --  1.05*  --  0.84  --   TROPONINIHS  --  13 12  --   --      Estimated Creatinine Clearance: 53.9 mL/min (by C-G formula based on SCr of 0.84 mg/dL).   Medical History: Past Medical History:  Diagnosis Date   Anemia    Asthma    Atrial fibrillation ([place])    Breast carcinoma ([place]) 1995   [id number]   Cardioembolic stroke ([place]) [id number]   Right frontal in 01/2012; normal carotid ultrasound; possible LAA thrombus by TEE; virtual complete neurologic recovery   Chronic kidney disease, stage 2, mildly decreased GFR    GFR of approximately 60   Depression    Diabetes mellitus without complication ([place])    Diverticulosis of colon (without mention of hemorrhage) 2012   Dr. Laural Golden   Fasting hyperglycemia    120 fasting   Gastroesophageal reflux disease    Gastroparesis    Hemorrhoids    Hyperlipidemia    Hypertension    pt denies 05/30/13     Dr Kathe Mariner   Hyponatremia    Rheumatic heart disease    a. 07/2013 Echo: Ef 55-60%, Mod AS/AI, Mod-Sev MS, sev dil LA, PASP 58;  b. 07/2013 TEE: EF 35-40%, mild-mod AS/AI, mod-sev MS, LA smoke;  c. 07/2013 Cath: elev R heart pressures, nl cors.   Shortness of breath     Medications:  Scheduled:   atorvastatin  40 mg Oral Daily    azithromycin  500 mg Oral Daily   diltiazem  180 mg Oral Daily   famotidine  20 mg Oral BID   furosemide  20 mg Oral Daily   guaiFENesin  600 mg Oral BID   insulin aspart  0-15 Units Subcutaneous TID WC   insulin aspart  0-5 Units Subcutaneous QHS   ipratropium-albuterol  3 mL Nebulization Q6H   irbesartan  37.5 mg Oral Daily   levothyroxine  100 mcg Oral QAC breakfast   loratadine  10 mg Oral Daily   methylPREDNISolone (SOLU-MEDROL) injection  90 mg Intravenous Q12H   Followed by   Derrill Memo ON 06/03/2021] predniSONE  40 mg Oral Q breakfast   metoprolol tartrate  25 mg Oral BID   potassium chloride SA  20 mEq Oral Daily    Assessment: Patient admitted for COPD exacerbation. Currently on coumadin for atrial fibrillation, h/o mitral & aortic valve replacement, and h/o stroke. Notable DDI include methylprednisolone/prednisone and doxycycline outpatient though not continued on admission.   Home dose listed as 3.75 mg every Sun,Tue,Thu and 2.5 mg ROW  Goal of Therapy:  INR 2.5 to 3.5 Monitor platelets by anticoagulation protocol: Yes   Plan:  Warfarin 2.5 mg x 1 dose. Monitor daily INR and s/s of bleeding.   [name], PharmD Clinical Pharmacist 06/02/2021 8:59 AM

## 2021-06-02 NOTE — Progress Notes (Signed)
Inpatient Diabetes Program Recommendations  AACE/ADA: New Consensus Statement on Inpatient Glycemic Control  Target Ranges:  Prepandial:   less than 140 mg/dL      Peak postprandial:   less than 180 mg/dL (1-2 hours)      Critically ill patients:  140 - 180 mg/dL   Results for [name], [name] (MRN [id number]) as of 06/02/2021 08:36  Ref. Range 06/01/2021 22:35 06/02/2021 07:26  Glucose-Capillary Latest Ref Range: 70 - 99 mg/dL 303 (H) 244 (H)    Review of Glycemic Control  Diabetes history: DM2 Outpatient Diabetes medications: Metformin 500 mg BID Current orders for Inpatient glycemic control: Novolog 0-15 units TID with meals, Novolog 0-5 units QHS; Solumedrol 90 mg Q12H  Inpatient Diabetes Program Recommendations:    Insulin: Please consider ordering one time Semglee 8 units x1 now. If steroids are continued, please consider ordering Novolog 3 units TID with meals for meal coverage if patient eats at least 50% of meals.   NOTE: Fasting 244 mg/dl today and noted steroids will be changed from Solumedrol to Prednisone on 06/03/21.   Thanks, Barnie Alderman, RN, MSN, CDE Diabetes Coordinator Inpatient Diabetes Program [phone] (Team Pager from 8am to 5pm)

## 2021-06-03 DIAGNOSIS — Z825 Family history of asthma and other chronic lower respiratory diseases: Secondary | ICD-10-CM | POA: Diagnosis not present

## 2021-06-03 DIAGNOSIS — E785 Hyperlipidemia, unspecified: Secondary | ICD-10-CM | POA: Diagnosis present

## 2021-06-03 DIAGNOSIS — I480 Paroxysmal atrial fibrillation: Secondary | ICD-10-CM | POA: Diagnosis not present

## 2021-06-03 DIAGNOSIS — Z7901 Long term (current) use of anticoagulants: Secondary | ICD-10-CM | POA: Diagnosis not present

## 2021-06-03 DIAGNOSIS — Z853 Personal history of malignant neoplasm of breast: Secondary | ICD-10-CM | POA: Diagnosis not present

## 2021-06-03 DIAGNOSIS — B338 Other specified viral diseases: Secondary | ICD-10-CM | POA: Diagnosis present

## 2021-06-03 DIAGNOSIS — Z79899 Other long term (current) drug therapy: Secondary | ICD-10-CM | POA: Diagnosis not present

## 2021-06-03 DIAGNOSIS — Z8249 Family history of ischemic heart disease and other diseases of the circulatory system: Secondary | ICD-10-CM | POA: Diagnosis not present

## 2021-06-03 DIAGNOSIS — Z20822 Contact with and (suspected) exposure to covid-19: Secondary | ICD-10-CM | POA: Diagnosis not present

## 2021-06-03 DIAGNOSIS — E1165 Type 2 diabetes mellitus with hyperglycemia: Secondary | ICD-10-CM | POA: Diagnosis not present

## 2021-06-03 DIAGNOSIS — J4521 Mild intermittent asthma with (acute) exacerbation: Secondary | ICD-10-CM

## 2021-06-03 DIAGNOSIS — I639 Cerebral infarction, unspecified: Secondary | ICD-10-CM | POA: Diagnosis not present

## 2021-06-03 DIAGNOSIS — Z888 Allergy status to other drugs, medicaments and biological substances status: Secondary | ICD-10-CM | POA: Diagnosis not present

## 2021-06-03 DIAGNOSIS — J441 Chronic obstructive pulmonary disease with (acute) exacerbation: Secondary | ICD-10-CM | POA: Diagnosis not present

## 2021-06-03 DIAGNOSIS — K219 Gastro-esophageal reflux disease without esophagitis: Secondary | ICD-10-CM | POA: Diagnosis present

## 2021-06-03 DIAGNOSIS — B974 Respiratory syncytial virus as the cause of diseases classified elsewhere: Secondary | ICD-10-CM | POA: Diagnosis not present

## 2021-06-03 DIAGNOSIS — Z833 Family history of diabetes mellitus: Secondary | ICD-10-CM | POA: Diagnosis not present

## 2021-06-03 DIAGNOSIS — J4531 Mild persistent asthma with (acute) exacerbation: Secondary | ICD-10-CM | POA: Diagnosis not present

## 2021-06-03 DIAGNOSIS — I4891 Unspecified atrial fibrillation: Secondary | ICD-10-CM | POA: Diagnosis not present

## 2021-06-03 DIAGNOSIS — Z79890 Hormone replacement therapy (postmenopausal): Secondary | ICD-10-CM | POA: Diagnosis not present

## 2021-06-03 DIAGNOSIS — I129 Hypertensive chronic kidney disease with stage 1 through stage 4 chronic kidney disease, or unspecified chronic kidney disease: Secondary | ICD-10-CM | POA: Diagnosis not present

## 2021-06-03 DIAGNOSIS — Z8261 Family history of arthritis: Secondary | ICD-10-CM | POA: Diagnosis not present

## 2021-06-03 DIAGNOSIS — N182 Chronic kidney disease, stage 2 (mild): Secondary | ICD-10-CM | POA: Diagnosis not present

## 2021-06-03 DIAGNOSIS — R0602 Shortness of breath: Secondary | ICD-10-CM | POA: Diagnosis present

## 2021-06-03 DIAGNOSIS — Z952 Presence of prosthetic heart valve: Secondary | ICD-10-CM | POA: Diagnosis not present

## 2021-06-03 DIAGNOSIS — Z8349 Family history of other endocrine, nutritional and metabolic diseases: Secondary | ICD-10-CM | POA: Diagnosis not present

## 2021-06-03 DIAGNOSIS — E039 Hypothyroidism, unspecified: Secondary | ICD-10-CM | POA: Diagnosis not present

## 2021-06-03 DIAGNOSIS — E1122 Type 2 diabetes mellitus with diabetic chronic kidney disease: Secondary | ICD-10-CM | POA: Diagnosis not present

## 2021-06-03 DIAGNOSIS — Z8673 Personal history of transient ischemic attack (TIA), and cerebral infarction without residual deficits: Secondary | ICD-10-CM | POA: Diagnosis not present

## 2021-06-03 LAB — LIPID PANEL
Cholesterol: 185 mg/dL (ref 0–200)
HDL: 66 mg/dL (ref >40)
LDL Cholesterol: 104 mg/dL — ABNORMAL HIGH (ref 0–99)
Total CHOL/HDL Ratio: 2.8 RATIO
Triglycerides: 75 mg/dL (ref <150)
VLDL: 15 mg/dL (ref 0–40)

## 2021-06-03 LAB — COMPREHENSIVE METABOLIC PANEL
ALT: 29 U/L (ref 0–44)
AST: 18 U/L (ref 15–41)
Albumin: 3.7 g/dL (ref 3.5–5.0)
Alkaline Phosphatase: 49 U/L (ref 38–126)
Anion gap: 8 (ref 5–15)
BUN: 21 mg/dL (ref 8–23)
CO2: 26 mmol/L (ref 22–32)
Calcium: 8.9 mg/dL (ref 8.9–10.3)
Chloride: 99 mmol/L (ref 98–111)
Creatinine, Ser: 0.79 mg/dL (ref 0.44–1.00)
GFR, Estimated: >60 mL/min (ref >60)
Glucose, Bld: 257 mg/dL — ABNORMAL HIGH (ref 70–99)
Potassium: 4.2 mmol/L (ref 3.5–5.1)
Sodium: 133 mmol/L — ABNORMAL LOW (ref 135–145)
Total Bilirubin: 0.6 mg/dL (ref 0.3–1.2)
Total Protein: 6.4 g/dL — ABNORMAL LOW (ref 6.5–8.1)

## 2021-06-03 LAB — MAGNESIUM: Magnesium: 2.3 mg/dL (ref 1.7–2.4)

## 2021-06-03 LAB — CBC WITH DIFFERENTIAL/PLATELET
Abs Immature Granulocytes: 0.1 10*3/uL — ABNORMAL HIGH (ref 0.00–0.07)
Basophils Absolute: 0 10*3/uL (ref 0.0–0.1)
Basophils Relative: 0 %
Eosinophils Absolute: 0 10*3/uL (ref 0.0–0.5)
Eosinophils Relative: 0 %
HCT: 40.8 % (ref 36.0–46.0)
Hemoglobin: 13 g/dL (ref 12.0–15.0)
Immature Granulocytes: 1 %
Lymphocytes Relative: 6 %
Lymphs Abs: 0.7 10*3/uL (ref 0.7–4.0)
MCH: 29.5 pg (ref 26.0–34.0)
MCHC: 31.9 g/dL (ref 30.0–36.0)
MCV: 92.5 fL (ref 80.0–100.0)
Monocytes Absolute: 0.4 10*3/uL (ref 0.1–1.0)
Monocytes Relative: 3 %
Neutro Abs: 9.9 10*3/uL — ABNORMAL HIGH (ref 1.7–7.7)
Neutrophils Relative %: 90 %
Platelets: 156 10*3/uL (ref 150–400)
RBC: 4.41 MIL/uL (ref 3.87–5.11)
RDW: 14.5 % (ref 11.5–15.5)
WBC: 11 10*3/uL — ABNORMAL HIGH (ref 4.0–10.5)
nRBC: 0 % (ref 0.0–0.2)

## 2021-06-03 LAB — GLUCOSE, CAPILLARY
Glucose-Capillary: 265 mg/dL — ABNORMAL HIGH (ref 70–99)
Glucose-Capillary: 318 mg/dL — ABNORMAL HIGH (ref 70–99)
Glucose-Capillary: 130 mg/dL — ABNORMAL HIGH (ref 70–99)
Glucose-Capillary: 158 mg/dL — ABNORMAL HIGH (ref 70–99)

## 2021-06-03 LAB — RESPIRATORY PANEL BY PCR

## 2021-06-03 LAB — PHOSPHORUS: Phosphorus: 4.2 mg/dL (ref 2.5–4.6)

## 2021-06-03 LAB — HEMOGLOBIN A1C
Hgb A1c MFr Bld: 7.4 % — ABNORMAL HIGH (ref 4.8–5.6)
Hgb A1c MFr Bld: 7.8 % — ABNORMAL HIGH (ref 4.8–5.6)
Mean Plasma Glucose: 166 mg/dL
Mean Plasma Glucose: 177.16 mg/dL

## 2021-06-03 LAB — PROTIME-INR
INR: 3.8 — ABNORMAL HIGH (ref 0.8–1.2)
Prothrombin Time: 37.1 seconds — ABNORMAL HIGH (ref 11.4–15.2)

## 2021-06-03 MED ORDER — METFORMIN HCL 500 MG PO TABS: 500.0000 mg | ORAL_TABLET | Freq: Two times a day (BID) | ORAL | Status: DC

## 2021-06-03 MED ORDER — IPRATROPIUM-ALBUTEROL 0.5-2.5 (3) MG/3ML IN SOLN
3.0000 mL | Freq: Two times a day (BID) | RESPIRATORY_TRACT | Status: DC
  Administered 2021-06-03 – 2021-06-04 (×2): 3 mL via RESPIRATORY_TRACT
  Filled 2021-06-03 (×2): qty 3

## 2021-06-03 MED ORDER — METOPROLOL TARTRATE 25 MG PO TABS
37.5000 mg | ORAL_TABLET | Freq: Two times a day (BID) | ORAL | Status: DC
  Administered 2021-06-03 – 2021-06-04 (×2): 37.5 mg via ORAL
  Filled 2021-06-03 (×2): qty 2

## 2021-06-03 MED ORDER — METOPROLOL TARTRATE 25 MG PO TABS
12.5000 mg | ORAL_TABLET | Freq: Once | ORAL | Status: AC
  Administered 2021-06-03: 12.5 mg via ORAL
  Filled 2021-06-03: qty 1

## 2021-06-03 MED ORDER — INSULIN ASPART 100 UNIT/ML IJ SOLN
0.0000 [IU] | INTRAMUSCULAR | Status: DC
  Administered 2021-06-03: 15 [IU] via SUBCUTANEOUS
  Administered 2021-06-03 (×2): 3 [IU] via SUBCUTANEOUS
  Administered 2021-06-04: 4 [IU] via SUBCUTANEOUS
  Administered 2021-06-04: 3 [IU] via SUBCUTANEOUS

## 2021-06-03 MED ORDER — METFORMIN HCL 500 MG PO TABS
500.0000 mg | ORAL_TABLET | Freq: Two times a day (BID) | ORAL | Status: DC
  Administered 2021-06-03 – 2021-06-04 (×2): 500 mg via ORAL
  Filled 2021-06-03 (×3): qty 1

## 2021-06-03 NOTE — TOC Progression Note (Cosign Needed)
Transition of Care [facility]) - Progression Note    Patient Details  Name: [name] MRN: [id number] Date of Birth: 04/18/1946  Transition of Care [facility]) CM/SW Contact  Boneta Lucks, RN Phone Number: 06/03/2021, 10:48 AM  Clinical Narrative:   TOC explained OBS status, by phone and visit to the room, copy provided, family is still very upset. Consulted UR. UR RN reviewing and having MD's review. EKG and pulse ox while ambulating suggested to consider for inpatient.  Expected Discharge Plan: Home/Self Care Barriers to Discharge: Continued Medical Work up  Expected Discharge Plan and Services Expected Discharge Plan: Home/Self Care

## 2021-06-03 NOTE — Progress Notes (Signed)
PROGRESS NOTE    [name]  [id number] DOB: 1946/04/06 DOA: 06/01/2021 PCP: [name], MD   Brief Narrative:  75 year old WF PMHx  COPD (lifelong non-smoker), hypertension, A. fib, chronic anticoagulation on Coumadin, history of mitral and aortic valve replacement, reflux, hyperlipidemia   Presents with her husband today for a 1 week history of worsening shortness of breath.  Patient recently completed a prednisone taper today.  She was on 5 days worth of prednisone.  She is also p.o. doxycycline.  Majority history was provided by the patient's husband.  He states that patient had a stroke several years ago and her memory is quite poor.  Husband states the patient was having shortness of breath and cough this past week.  Was seen by her primary care physician.  Placed on doxycycline and p.o. prednisone.  Husband states that her breathing improved only for the first day on prednisone.  Patient been using an albuterol inhaler at home.  She also uses inhaled Flovent.  Patient does not have a home nebulizer machine.  Patient's been having worsening cough.  She has had shortness of breath at rest.  Due to the patient's wheezing at home and worsening shortness of breath, patient brought into the ER for evaluation.  In the ER, patient noted to be afebrile.  Room air saturations 93%.  Patient is COVID-negative.  BNP was stable at 114.  INR slightly elevated at 3.0.  Yesterday was 3.4.  CT of the chest was negative for acute PE.   Patient received multiple rounds of albuterol.  She also received IV Solu-Medrol.  Patient was still wheezing.  She was still coughing and feeling short of breath.   Due to the patient's unimproved respiratory status, acute COPD exacerbation, triad hospitalist contacted for admission.    Subjective: 9/1 afebrile overnight, remains A. fib RVR.  States ambulated down hallway with some DOE, but felt much better than at admission    Assessment & Plan:  Covid  vaccination; vaccinated 4/4  Principal Problem:   COPD with acute exacerbation ([place]) Active Problems:   Gastroesophageal reflux disease   Hyperlipidemia   Essential hypertension   Hypothyroidism   Chronic anticoagulation   Cardioembolic stroke ([place])   Atrial fibrillation ([place])   Status post aortic valve and mitral valve replacement   Asthma exacerbation   Type 2 diabetes mellitus treated without insulin (HCC)   COPD exacerbation ([place])   RSV infection   COPD with acute exacerbation (HCC)/positive RSV Admit to medical bed. Continue with IV solumedrol 60 mg q8h. Pt just completed 5 day course of prednisone taper without much success. Took po doxycycline.  Will change to po zithromax. Avoiding quinolones due to coumadin therapy. Continue with duonebs q6h. Pt need nebulizer machine for home use. CM to help arrange for nebulizer machine for home use. Will try one dose of IV mag sulfate to see if we can break her wheezing. -9/1 positive RSV -9/1 does not meet criteria for home O2 -9/1 complete 5-day course azithromycin - Prednisone 40 mg daily Patient Saturations on Room Air at Rest = 97% ; Pulse 106   Patient Saturations on Room Air while Ambulating = 96%; pulse 110   Patients HR went down as low as 54 right before heading back into her room for like 5 seconds. But went right back up to 106-110 average.    Patient walked around unit 300 w/o difficulty, she denied SOB or distress during ambulation.     Essential hypertension -See A.  fib RVR   Chronic anticoagulation -On coumadin for hx of afib and cardioembolic stroke. -See A. fib RVR   Cardioembolic stroke ([place]) -On coumadin. Possible Left atrial thrombus seen on echo in the past.   A. fib RVR -Unstable most likely secondary to COPD exacerbation - Cardizem 180 mg daily - Lasix 20 mg daily - 9/1 increase metoprolol 37.5 mg BID -Coumadin per pharmacy  Lab Results  Component Value Date   INR 3.8 (H) 06/03/2021   INR 3.1  (H) 06/02/2021   INR 3.0 (H) 06/01/2021   INR 3.4 (A) 05/31/2021   INR 2.6 05/27/2021    Gastroesophageal reflux disease Stable.   DM type II uncontrolled with hyperglycemia - 9/1 hemoglobin A1c= 7.8 -9/1 restart metformin 500 mg BID -9/1 increase resistant SSI    Hyperlipidemia -Lipitor 40 mg daily   Hypothyroidism -Synthroid 100 mcg daily.   Status post aortic valve and mitral valve replacement -Continue Coumadin   DVT prophylaxis: Coumadin Code Status: Full Family Communication: 8/31 husband at bedside for discussion of plan of care all questions addressed Status is: Inpatient    Dispo: The patient is from: Home              Anticipated d/c is to: Home              Anticipated d/c date is: 1 day              Patient currently is not medically stable to d/c.      Consultants:    Procedures/Significant Events:     I have personally reviewed and interpreted all radiology studies and my findings are as above.  VENTILATOR SETTINGS:    Cultures   Antimicrobials:    Devices    LINES / TUBES:      Continuous Infusions:   Objective: Vitals:   06/03/21 0401 06/03/21 0729 06/03/21 1304 06/03/21 1931  BP: 135/83  (!) 104/57   Pulse: (!) 110  81   Resp: 20  18   Temp: 98.1 F (36.7 C)  98.5 F (36.9 C)   TempSrc:   Oral   SpO2: 99% 99% 96% 97%  Weight:      Height:        Intake/Output Summary (Last 24 hours) at 06/03/2021 2026 Last data filed at 06/03/2021 1303 Gross per 24 hour  Intake 716 ml  Output --  Net 716 ml   Filed Weights   06/01/21 1358 06/01/21 2300  Weight: 68 kg 77.2 kg    Examination:  General: A/O x4, positive acute respiratory distress Eyes: negative scleral hemorrhage, negative anisocoria, negative icterus ENT: Negative Runny nose, negative gingival bleeding, Neck:  Negative scars, masses, torticollis, lymphadenopathy, JVD Lungs: diffuse expiratory wheeze, negative crackles Cardiovascular: Irregularly  irregular rhythm and rate without murmur gallop or rub normal S1 and S2 Abdomen: negative abdominal pain, nondistended, positive soft, bowel sounds, no rebound, no ascites, no appreciable mass Extremities: No significant cyanosis, clubbing, or edema bilateral lower extremities Skin: Negative rashes, lesions, ulcers Psychiatric:  Negative depression, negative anxiety, negative fatigue, negative mania  Central nervous system:  Cranial nerves II through XII intact, tongue/uvula midline, all extremities muscle strength 5/5, sensation intact throughout, negative dysarthria, negative expressive aphasia, negative receptive aphasia.  .     Data Reviewed: Care during the described time interval was provided by me .  I have reviewed this patient's available data, including medical history, events of note, physical examination, and all test results as part of my  evaluation.   CBC: Recent Labs  Lab 06/01/21 1554 06/02/21 0459 06/03/21 0538  WBC 9.5 8.4 11.0*  NEUTROABS 8.2* 7.4 9.9*  HGB 14.0 13.4 13.0  HCT 43.5 42.5 40.8  MCV 93.8 92.8 92.5  PLT 184 151 [id number]   Basic Metabolic Panel: Recent Labs  Lab 06/01/21 1554 06/02/21 0459 06/03/21 0538  NA 132* 135 133*  K 4.6 4.2 4.2  CL 95* 97* 99  CO2 '26 25 26  '$ GLUCOSE 262* 249* 257*  BUN '22 17 21  '$ CREATININE 1.05* 0.84 0.79  CALCIUM 9.0 8.9 8.9  MG  --  2.4 2.3  PHOS  --   --  4.2   GFR: Estimated Creatinine Clearance: 56.6 mL/min (by C-G formula based on SCr of 0.79 mg/dL). Liver Function Tests: Recent Labs  Lab 06/01/21 1554 06/02/21 0459 06/03/21 0538  AST '24 21 18  '$ ALT 34 29 29  ALKPHOS 59 53 49  BILITOT 0.6 0.5 0.6  PROT 7.3 6.6 6.4*  ALBUMIN 4.1 3.7 3.7   No results for input(s): LIPASE, AMYLASE in the last 168 hours. No results for input(s): AMMONIA in the last 168 hours. Coagulation Profile: Recent Labs  Lab 05/31/21 0829 06/01/21 1554 06/02/21 0748 06/03/21 0538  INR 3.4* 3.0* 3.1* 3.8*   Cardiac Enzymes: No  results for input(s): CKTOTAL, CKMB, CKMBINDEX, TROPONINI in the last 168 hours. BNP (last 3 results) No results for input(s): PROBNP in the last 8760 hours. HbA1C: Recent Labs    06/01/21 1743 06/03/21 0538  HGBA1C 7.4* 7.8*   CBG: Recent Labs  Lab 06/02/21 2058 06/03/21 0805 06/03/21 1131 06/03/21 1626 06/03/21 1953  GLUCAP 291* 265* 318* 130* 158*   Lipid Profile: Recent Labs    06/03/21 0538  CHOL 185  HDL 66  LDLCALC 104*  TRIG 75  CHOLHDL 2.8   Thyroid Function Tests: No results for input(s): TSH, T4TOTAL, FREET4, T3FREE, THYROIDAB in the last 72 hours. Anemia Panel: No results for input(s): VITAMINB12, FOLATE, FERRITIN, TIBC, IRON, RETICCTPCT in the last 72 hours. Urine analysis:    Component Value Date/Time   COLORURINE YELLOW 11/12/2016 0043   APPEARANCEUR HAZY (A) 11/12/2016 0043   LABSPEC 1.004 (L) 11/12/2016 0043   PHURINE 7.0 11/12/2016 0043   GLUCOSEU NEGATIVE 11/12/2016 0043   HGBUR NEGATIVE 11/12/2016 0043   BILIRUBINUR NEGATIVE 11/12/2016 0043   KETONESUR NEGATIVE 11/12/2016 0043   PROTEINUR NEGATIVE 11/12/2016 0043   UROBILINOGEN 0.2 07/07/2014 1935   NITRITE POSITIVE (A) 11/12/2016 0043   LEUKOCYTESUR MODERATE (A) 11/12/2016 0043   Sepsis Labs: '@LABRCNTIP'$ (procalcitonin:4,lacticidven:4)  ) Recent Results (from the past 240 hour(s))  Covid-19, Flu A+B ([facility])     Status: None   Collection Time: 05/26/21 10:13 AM   Specimen: Nasal Swab; Nasopharyngeal   Naso  Patient immune s  Result Value Ref Range Status   SARS-CoV-2, NAA Not Detected Not Detected Final   Influenza A, NAA Not Detected Not Detected Final   Influenza B, NAA Not Detected Not Detected Final   Test Information: Comment  Final    Comment: This nucleic acid amplification test was developed and its performance characteristics determined by [employer]. Nucleic acid amplification tests include RT-PCR and TMA. This test has not been FDA cleared or approved. This  test has been authorized by FDA under an Emergency Use Authorization (EUA). This test is only authorized for the duration of time the declaration that circumstances exist justifying the authorization of the emergency use of in vitro diagnostic tests for detection  of SARS-CoV-2 virus and/or diagnosis of COVID-19 infection under section 564(b)(1) of the Act, 21 U.S.C. [id number]) (1), unless the authorization is terminated or revoked sooner. When diagnostic testing is negative, the possibility of a false negative result should be considered in the context of a patient's recent exposures and the presence of clinical signs and symptoms consistent with COVID-19. An individual without symptoms of COVID-19 and who is not shedding SARS-CoV-2 virus wo uld expect to have a negative (not detected) result in this assay.   Resp Panel by RT-PCR (Flu A&B, Covid) Nasopharyngeal Swab     Status: None   Collection Time: 05/27/21  3:32 AM   Specimen: Nasopharyngeal Swab; Nasopharyngeal(NP) swabs in vial transport medium  Result Value Ref Range Status   SARS Coronavirus 2 by RT PCR NEGATIVE NEGATIVE Final    Comment: (NOTE) SARS-CoV-2 target nucleic acids are NOT DETECTED.  The SARS-CoV-2 RNA is generally detectable in upper respiratory specimens during the acute phase of infection. The lowest concentration of SARS-CoV-2 viral copies this assay can detect is 138 copies/mL. A negative result does not preclude SARS-Cov-2 infection and should not be used as the sole basis for treatment or other patient management decisions. A negative result may occur with  improper specimen collection/handling, submission of specimen other than nasopharyngeal swab, presence of viral mutation(s) within the areas targeted by this assay, and inadequate number of viral copies(<138 copies/mL). A negative result must be combined with clinical observations, patient history, and epidemiological information. The expected result  is Negative.  Fact Sheet for Patients:  EntrepreneurPulse.com.au  Fact Sheet for Healthcare Providers:  IncredibleEmployment.be  This test is no t yet approved or cleared by the [place] FDA and  has been authorized for detection and/or diagnosis of SARS-CoV-2 by FDA under an Emergency Use Authorization (EUA). This EUA will remain  in effect (meaning this test can be used) for the duration of the COVID-19 declaration under Section 564(b)(1) of the Act, 21 U.S.C.section 360bbb-3(b)(1), unless the authorization is terminated  or revoked sooner.       Influenza A by PCR NEGATIVE NEGATIVE Final   Influenza B by PCR NEGATIVE NEGATIVE Final    Comment: (NOTE) The Xpert Xpress SARS-CoV-2/FLU/RSV plus assay is intended as an aid in the diagnosis of influenza from Nasopharyngeal swab specimens and should not be used as a sole basis for treatment. Nasal washings and aspirates are unacceptable for Xpert Xpress SARS-CoV-2/FLU/RSV testing.  Fact Sheet for Patients: EntrepreneurPulse.com.au  Fact Sheet for Healthcare Providers: IncredibleEmployment.be  This test is not yet approved or cleared by the [place] FDA and has been authorized for detection and/or diagnosis of SARS-CoV-2 by FDA under an Emergency Use Authorization (EUA). This EUA will remain in effect (meaning this test can be used) for the duration of the COVID-19 declaration under Section 564(b)(1) of the Act, 21 U.S.C. section 360bbb-3(b)(1), unless the authorization is terminated or revoked.  Performed at [facility], [address], [place], [place] [address]   Resp Panel by RT-PCR (Flu A&B, Covid) Nasopharyngeal Swab     Status: None   Collection Time: 06/01/21  3:37 PM   Specimen: Nasopharyngeal Swab; Nasopharyngeal(NP) swabs in vial transport medium  Result Value Ref Range Status   SARS Coronavirus 2 by RT PCR NEGATIVE NEGATIVE Final     Comment: (NOTE) SARS-CoV-2 target nucleic acids are NOT DETECTED.  The SARS-CoV-2 RNA is generally detectable in upper respiratory specimens during the acute phase of infection. The lowest concentration of SARS-CoV-2 viral copies  this assay can detect is 138 copies/mL. A negative result does not preclude SARS-Cov-2 infection and should not be used as the sole basis for treatment or other patient management decisions. A negative result may occur with  improper specimen collection/handling, submission of specimen other than nasopharyngeal swab, presence of viral mutation(s) within the areas targeted by this assay, and inadequate number of viral copies(<138 copies/mL). A negative result must be combined with clinical observations, patient history, and epidemiological information. The expected result is Negative.  Fact Sheet for Patients:  EntrepreneurPulse.com.au  Fact Sheet for Healthcare Providers:  IncredibleEmployment.be  This test is no t yet approved or cleared by the [place] FDA and  has been authorized for detection and/or diagnosis of SARS-CoV-2 by FDA under an Emergency Use Authorization (EUA). This EUA will remain  in effect (meaning this test can be used) for the duration of the COVID-19 declaration under Section 564(b)(1) of the Act, 21 U.S.C.section 360bbb-3(b)(1), unless the authorization is terminated  or revoked sooner.       Influenza A by PCR NEGATIVE NEGATIVE Final   Influenza B by PCR NEGATIVE NEGATIVE Final    Comment: (NOTE) The Xpert Xpress SARS-CoV-2/FLU/RSV plus assay is intended as an aid in the diagnosis of influenza from Nasopharyngeal swab specimens and should not be used as a sole basis for treatment. Nasal washings and aspirates are unacceptable for Xpert Xpress SARS-CoV-2/FLU/RSV testing.  Fact Sheet for Patients: EntrepreneurPulse.com.au  Fact Sheet for Healthcare  Providers: IncredibleEmployment.be  This test is not yet approved or cleared by the [place] FDA and has been authorized for detection and/or diagnosis of SARS-CoV-2 by FDA under an Emergency Use Authorization (EUA). This EUA will remain in effect (meaning this test can be used) for the duration of the COVID-19 declaration under Section 564(b)(1) of the Act, 21 U.S.C. section 360bbb-3(b)(1), unless the authorization is terminated or revoked.  Performed at [facility], [address] [place], [place] [address]   Respiratory (~20 pathogens) panel by PCR     Status: Abnormal   Collection Time: 06/03/21  5:45 AM   Specimen: Nasopharyngeal Swab; Respiratory  Result Value Ref Range Status   Adenovirus NOT DETECTED NOT DETECTED Final   Coronavirus 229E NOT DETECTED NOT DETECTED Final    Comment: (NOTE) The Coronavirus on the Respiratory Panel, DOES NOT test for the novel  Coronavirus (2019 nCoV)    Coronavirus HKU1 NOT DETECTED NOT DETECTED Final   Coronavirus NL63 NOT DETECTED NOT DETECTED Final   Coronavirus OC43 NOT DETECTED NOT DETECTED Final   Metapneumovirus NOT DETECTED NOT DETECTED Final   Rhinovirus / Enterovirus NOT DETECTED NOT DETECTED Final   Influenza A NOT DETECTED NOT DETECTED Final   Influenza B NOT DETECTED NOT DETECTED Final   Parainfluenza Virus 1 NOT DETECTED NOT DETECTED Final   Parainfluenza Virus 2 NOT DETECTED NOT DETECTED Final   Parainfluenza Virus 3 NOT DETECTED NOT DETECTED Final   Parainfluenza Virus 4 NOT DETECTED NOT DETECTED Final   Respiratory Syncytial Virus DETECTED (A) NOT DETECTED Final   Bordetella pertussis NOT DETECTED NOT DETECTED Final   Bordetella Parapertussis NOT DETECTED NOT DETECTED Final   Chlamydophila pneumoniae NOT DETECTED NOT DETECTED Final   Mycoplasma pneumoniae NOT DETECTED NOT DETECTED Final    Comment: Performed at [facility], [place] [address] [place], [place] [address]         Radiology  Studies: No results found.      Scheduled Meds:  atorvastatin  40 mg  Oral Daily   azithromycin  500 mg Oral Daily   diltiazem  180 mg Oral Daily   famotidine  20 mg Oral BID   furosemide  20 mg Oral Daily   insulin aspart  0-20 Units Subcutaneous Q4H   ipratropium-albuterol  3 mL Nebulization BID   irbesartan  37.5 mg Oral Daily   levothyroxine  100 mcg Oral QAC breakfast   loratadine  10 mg Oral Daily   metFORMIN  500 mg Oral BID WC   metoprolol tartrate  37.5 mg Oral BID   potassium chloride SA  20 mEq Oral Daily   predniSONE  40 mg Oral Q breakfast   Warfarin - Pharmacist Dosing Inpatient   Does not apply q1600   Continuous Infusions:   LOS: 0 days   The patient is critically ill with multiple organ systems failure and requires high complexity decision making for assessment and support, frequent evaluation and titration of therapies, application of advanced monitoring technologies and extensive interpretation of multiple databases. Critical Care Time devoted to patient care services described in this note  Time spent: 40 minutes     Donell Tomkins, Geraldo Docker, MD [facility]   If 7PM-7AM, please contact night-coverage 06/03/2021, 8:26 PM

## 2021-06-03 NOTE — Progress Notes (Signed)
SATURATION QUALIFICATIONS:  Patient Saturations on Room Air at Rest = 97% ; Pulse 106  Patient Saturations on Room Air while Ambulating = 96%; pulse 110  Patients HR went down as low as 54 right before heading back into her room for like 5 seconds. But went right back up to 106-110 average.   Patient walked around unit 300 w/o difficulty, she denied SOB or distress during ambulation.

## 2021-06-03 NOTE — Progress Notes (Addendum)
Inpatient Diabetes Program Recommendations  AACE/ADA: New Consensus Statement on Inpatient Glycemic Control   Target Ranges:  Prepandial:   less than 140 mg/dL      Peak postprandial:   less than 180 mg/dL (1-2 hours)      Critically ill patients:  140 - 180 mg/dL  Results for TRYNA, CROW (MRN [id number]) as of 06/03/2021 08:22  Ref. Range 06/02/2021 07:26 06/02/2021 11:49 06/02/2021 16:07 06/02/2021 20:58 06/03/2021 08:05  Glucose-Capillary Latest Ref Range: 70 - 99 mg/dL 244 (H) 307 (H) 229 (H) 291 (H) 265 (H)   Review of Glycemic Control  Diabetes history: DM2 Outpatient Diabetes medications: Metformin 500 mg BID Current orders for Inpatient glycemic control: Novolog 0-15 units TID with meals, Novolog 0-5 units QHS; Prednisone 40 mg QAM   Inpatient Diabetes Program Recommendations:     Insulin: Please consider ordering one time Semglee 5 units x1 now. If steroids are continued, please consider ordering Novolog 3 units TID with meals for meal coverage if patient eats at least 50% of meals.   Thanks, Barnie Alderman, RN, MSN, CDE Diabetes Coordinator Inpatient Diabetes Program [phone] (Team Pager from 8am to 5pm)

## 2021-06-03 NOTE — Care Management Obs Status (Signed)
[place] NOTIFICATION   Patient Details  Name: [name] MRN: [id number] Date of Birth: 1946/04/15   Medicare Observation Status Notification Given:  Yes    Tommy Medal 06/03/2021, 8:46 AM

## 2021-06-03 NOTE — Progress Notes (Signed)
ANTICOAGULATION CONSULT NOTE -   Pharmacy Consult for warfarin Indication: atrial fibrillation and stroke Mitral and aortic valve replacements   Allergies  Allergen Reactions   Amiodarone Rash    Patient Measurements: Height: '5\' 2"'$  (157.5 cm) Weight: 77.2 kg (170 lb 3.1 oz) IBW/kg (Calculated) : 50.1  Vital Signs: Temp: 98.1 F (36.7 C) (09/01 0401) Temp Source: Oral (08/31 2058) BP: 135/83 (09/01 0401) Pulse Rate: 110 (09/01 0401)  Labs: Recent Labs    06/01/21 1554 06/01/21 1742 06/02/21 0459 06/02/21 0748 06/03/21 0538  HGB 14.0  --  13.4  --  13.0  HCT 43.5  --  42.5  --  40.8  PLT 184  --  151  --  156  LABPROT 31.4*  --   --  31.8* 37.1*  INR 3.0*  --   --  3.1* 3.8*  CREATININE 1.05*  --  0.84  --  0.79  TROPONINIHS 13 12  --   --   --      Estimated Creatinine Clearance: 56.6 mL/min (by C-G formula based on SCr of 0.79 mg/dL).   Medical History: Past Medical History:  Diagnosis Date   Anemia    Asthma    Atrial fibrillation ([place])    Breast carcinoma ([place]) 1995   [id number]   Cardioembolic stroke ([place]) [id number]   Right frontal in 01/2012; normal carotid ultrasound; possible LAA thrombus by TEE; virtual complete neurologic recovery   Chronic kidney disease, stage 2, mildly decreased GFR    GFR of approximately 60   Depression    Diabetes mellitus without complication ([place])    Diverticulosis of colon (without mention of hemorrhage) 2012   Dr. Laural Golden   Fasting hyperglycemia    120 fasting   Gastroesophageal reflux disease    Gastroparesis    Hemorrhoids    Hyperlipidemia    Hypertension    pt denies 05/30/13     Dr Kathe Mariner   Hyponatremia    Rheumatic heart disease    a. 07/2013 Echo: Ef 55-60%, Mod AS/AI, Mod-Sev MS, sev dil LA, PASP 58;  b. 07/2013 TEE: EF 35-40%, mild-mod AS/AI, mod-sev MS, LA smoke;  c. 07/2013 Cath: elev R heart pressures, nl cors.   Shortness of breath     Medications:  Scheduled:   atorvastatin  40 mg Oral Daily    azithromycin  500 mg Oral Daily   diltiazem  180 mg Oral Daily   famotidine  20 mg Oral BID   furosemide  20 mg Oral Daily   insulin aspart  0-15 Units Subcutaneous TID WC   insulin aspart  0-5 Units Subcutaneous QHS   ipratropium-albuterol  3 mL Nebulization TID   irbesartan  37.5 mg Oral Daily   levothyroxine  100 mcg Oral QAC breakfast   loratadine  10 mg Oral Daily   metoprolol tartrate  25 mg Oral BID   potassium chloride SA  20 mEq Oral Daily   predniSONE  40 mg Oral Q breakfast   Warfarin - Pharmacist Dosing Inpatient   Does not apply q1600    Assessment: Patient admitted for COPD exacerbation. Currently on coumadin for atrial fibrillation, h/o mitral & aortic valve replacement, and h/o stroke. Notable DDI include methylprednisolone/prednisone and doxycycline outpatient though not continued on admission.   Home dose listed as 3.75 mg every Sun,Tue,Thu and 2.5 mg ROW  INR 3.1 >> 3.8   Goal of Therapy:  INR 2.5 to 3.5 Monitor platelets by anticoagulation protocol: Yes  Plan:  Hold warfarin x 1 dose. Monitor daily INR and s/s of bleeding.   [name], PharmD Clinical Pharmacist 06/03/2021 8:24 AM

## 2021-06-04 ENCOUNTER — Other Ambulatory Visit: Payer: Self-pay | Admitting: Cardiology

## 2021-06-04 DIAGNOSIS — J4531 Mild persistent asthma with (acute) exacerbation: Secondary | ICD-10-CM

## 2021-06-04 LAB — GLUCOSE, CAPILLARY
Glucose-Capillary: 109 mg/dL — ABNORMAL HIGH (ref 70–99)
Glucose-Capillary: 133 mg/dL — ABNORMAL HIGH (ref 70–99)
Glucose-Capillary: 172 mg/dL — ABNORMAL HIGH (ref 70–99)
Glucose-Capillary: 84 mg/dL (ref 70–99)

## 2021-06-04 LAB — COMPREHENSIVE METABOLIC PANEL
ALT: 29 U/L (ref 0–44)
AST: 20 U/L (ref 15–41)
Albumin: 3.7 g/dL (ref 3.5–5.0)
Alkaline Phosphatase: 47 U/L (ref 38–126)
Anion gap: 9 (ref 5–15)
BUN: 26 mg/dL — ABNORMAL HIGH (ref 8–23)
CO2: 26 mmol/L (ref 22–32)
Calcium: 8.7 mg/dL — ABNORMAL LOW (ref 8.9–10.3)
Chloride: 100 mmol/L (ref 98–111)
Creatinine, Ser: 0.83 mg/dL (ref 0.44–1.00)
GFR, Estimated: >60 mL/min (ref >60)
Glucose, Bld: 88 mg/dL (ref 70–99)
Potassium: 4.3 mmol/L (ref 3.5–5.1)
Sodium: 135 mmol/L (ref 135–145)
Total Bilirubin: 0.8 mg/dL (ref 0.3–1.2)
Total Protein: 6.3 g/dL — ABNORMAL LOW (ref 6.5–8.1)

## 2021-06-04 LAB — CBC WITH DIFFERENTIAL/PLATELET
Abs Immature Granulocytes: 0.19 10*3/uL — ABNORMAL HIGH (ref 0.00–0.07)
Basophils Absolute: 0 10*3/uL (ref 0.0–0.1)
Basophils Relative: 0 %
Eosinophils Absolute: 0 10*3/uL (ref 0.0–0.5)
Eosinophils Relative: 0 %
HCT: 43.3 % (ref 36.0–46.0)
Hemoglobin: 13.5 g/dL (ref 12.0–15.0)
Immature Granulocytes: 1 %
Lymphocytes Relative: 10 %
Lymphs Abs: 1.4 10*3/uL (ref 0.7–4.0)
MCH: 29.5 pg (ref 26.0–34.0)
MCHC: 31.2 g/dL (ref 30.0–36.0)
MCV: 94.7 fL (ref 80.0–100.0)
Monocytes Absolute: 1 10*3/uL (ref 0.1–1.0)
Monocytes Relative: 7 %
Neutro Abs: 11.9 10*3/uL — ABNORMAL HIGH (ref 1.7–7.7)
Neutrophils Relative %: 82 %
Platelets: 163 10*3/uL (ref 150–400)
RBC: 4.57 MIL/uL (ref 3.87–5.11)
RDW: 14.5 % (ref 11.5–15.5)
WBC: 14.5 10*3/uL — ABNORMAL HIGH (ref 4.0–10.5)
nRBC: 0 % (ref 0.0–0.2)

## 2021-06-04 LAB — PHOSPHORUS: Phosphorus: 4.4 mg/dL (ref 2.5–4.6)

## 2021-06-04 LAB — PROTIME-INR
INR: 3.7 — ABNORMAL HIGH (ref 0.8–1.2)
Prothrombin Time: 36.5 seconds — ABNORMAL HIGH (ref 11.4–15.2)

## 2021-06-04 LAB — MAGNESIUM: Magnesium: 2.3 mg/dL (ref 1.7–2.4)

## 2021-06-04 MED ORDER — ONDANSETRON HCL 4 MG PO TABS: 4.0000 mg | ORAL_TABLET | Freq: Four times a day (QID) | ORAL | 0 refills | Status: DC | PRN

## 2021-06-04 MED ORDER — GUAIFENESIN-CODEINE 100-10 MG/5ML PO SOLN: 10.0000 mL | Freq: Four times a day (QID) | ORAL | 0 refills | Status: DC | PRN

## 2021-06-04 MED ORDER — PREDNISONE 20 MG PO TABS: 40.0000 mg | ORAL_TABLET | Freq: Every day | ORAL | 0 refills | Status: DC

## 2021-06-04 MED ORDER — METOPROLOL TARTRATE 25 MG PO TABS: 12.5000 mg | ORAL_TABLET | Freq: Two times a day (BID) | ORAL | 0 refills | Status: DC

## 2021-06-04 MED ORDER — IPRATROPIUM BROMIDE HFA 17 MCG/ACT IN AERS: 2.0000 | INHALATION_SPRAY | Freq: Four times a day (QID) | RESPIRATORY_TRACT | 0 refills | Status: DC

## 2021-06-04 MED ORDER — GLUCOPHAGE 1000 MG PO TABS: 1000.0000 mg | ORAL_TABLET | Freq: Two times a day (BID) | ORAL | 0 refills | Status: DC

## 2021-06-04 MED ORDER — AZITHROMYCIN 250 MG PO TABS: ORAL_TABLET | ORAL | 0 refills | Status: DC

## 2021-06-04 NOTE — Care Management Important Message (Signed)
Important Message  Patient Details  Name: [name] MRN: [id number] Date of Birth: 06/12/1946   Medicare Important Message Given:  Yes     Tommy Medal 06/04/2021, 1:21 PM

## 2021-06-04 NOTE — TOC Initial Note (Signed)
Transition of Care [facility]) - Initial/Assessment Note    Patient Details  Name: [name] MRN: [id number] Date of Birth: [birth year]  Transition of Care [facility]) CM/SW Contact:    Joaquin Courts, RN Phone Number: 06/04/2021, 11:01 AM  Clinical Narrative:  Patient with high readmission score, no noted TOC needs for discharge.  Follow-up pcp appointment scheduled and placed on AVS.                  Expected Discharge Plan: Home/Self Care Barriers to Discharge: No Barriers Identified   Patient Goals and CMS Choice Patient states their goals for this hospitalization and ongoing recovery are:: to get better. CMS Medicare.gov Compare Post Acute Care list provided to:: Patient    Expected Discharge Plan and Services Expected Discharge Plan: Home/Self Care       Living arrangements for the past 2 months: Single Family Home                 DME Arranged: N/A DME Agency: NA       HH Arranged: NA          Prior Living Arrangements/Services Living arrangements for the past 2 months: Single Family Home   Patient language and need for interpreter reviewed:: Yes Do you feel safe going back to the place where you live?: Yes      Need for Family Participation in Patient Care: No (Comment) Care giver support system in place?: Yes (comment)   Criminal Activity/Legal Involvement Pertinent to Current Situation/Hospitalization: No - Comment as needed  Activities of Daily Living Home Assistive Devices/Equipment: Eyeglasses, Cane (specify quad or straight) ADL Screening (condition at time of admission) Patient's cognitive ability adequate to safely complete daily activities?: Yes Is the patient deaf or have difficulty hearing?: No Does the patient have difficulty seeing, even when wearing glasses/contacts?: No Does the patient have difficulty concentrating, remembering, or making decisions?: No Patient able to express need for assistance with ADLs?: Yes Does the patient have  difficulty dressing or bathing?: No Independently performs ADLs?: Yes (appropriate for developmental age) Does the patient have difficulty walking or climbing stairs?: No Weakness of Legs: None Weakness of Arms/Hands: None  Permission Sought/Granted                  Emotional Assessment Appearance:: Appears stated age     Orientation: : Oriented to Self, Oriented to Place, Oriented to  Time, Oriented to Situation   Psych Involvement: No (comment)  Admission diagnosis:  Cough [R05.9] COPD exacerbation ([place]) [J44.1] Patient Active Problem List   Diagnosis Date Noted   RSV infection 06/03/2021   COPD with acute exacerbation ([place]) 06/01/2021   Type 2 diabetes mellitus treated without insulin ([place]) 06/01/2021   COPD exacerbation ([place]) 06/01/2021   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   Asthma exacerbation 11/12/2016   Synovial cyst of lumbar spine 05/13/2014   Encounter for therapeutic drug monitoring 11/06/2013   Hyponatremia    Atrial fibrillation ([facility])    Moderate aortic stenosis 07/12/2013   Moderate aortic insufficiency 07/12/2013   Status post aortic valve and mitral valve replacement 07/12/2013   Severe mitral valve stenosis 07/09/2013   Mitral stenosis 07/08/2013   Chest pain 07/07/2013   Asthma, cough variant 03/27/2013   Upper airway cough syndrome in pt with mild cough variant asthma  09/17/2012   Dyspnea on exertion 06/13/2012   Edema 06/13/2012   Rheumatic heart disease 06/13/2012   Fasting hyperglycemia    Chronic anticoagulation 01/26/2012  Hypothyroidism [id number]   Cardioembolic stroke ([place]) [id number]   Hyperlipidemia    Essential hypertension    Breast carcinoma ([place])    Gastroesophageal reflux disease    PCP:  [name], MD Pharmacy:   [facility], [place] [id number] [place] [id number] Phone: [phone] Fax: [phone]     Social Determinants of Health (SDOH) Interventions     Readmission Risk Interventions Readmission Risk Prevention Plan 06/04/2021  Transportation Screening Complete  Medication Review ([place]) Complete  PCP or Specialist appointment within 3-5 days of discharge Complete  HRI or [place] Complete  SW Recovery Care/Counseling Consult Complete  [place] Not Applicable  Some recent data might be hidden

## 2021-06-04 NOTE — Discharge Summary (Signed)
Physician Discharge Summary  [name] [id number] DOB: 28-Dec-1945 DOA: 06/01/2021  PCP: [name], MD  Admit date: 06/01/2021 Discharge date: 06/04/2021  Time spent: 35 minutes  Recommendations for Outpatient Follow-up:   Covid vaccination; vaccinated 4/4  COPD with acute exacerbation (HCC)/positive RSV Admit to medical bed. Continue with IV solumedrol 60 mg q8h. Pt just completed 5 day course of prednisone taper without much success. Took po doxycycline.  Will change to po zithromax. Avoiding quinolones due to coumadin therapy. Continue with duonebs q6h. Pt need nebulizer machine for home use. CM to help arrange for nebulizer machine for home use. Will try one dose of IV mag sulfate to see if we can break her wheezing. -9/1 positive RSV -9/1 complete 5-day course azithromycin - Prednisone 40 mg daily x7 days -9/1 does not meet criteria for home O2 Patient Saturations on Room Air at Rest = 97% ; Pulse 106 Patient Saturations on Room Air while Ambulating = 96%; pulse 110 Patients HR went down as low as 54 right before heading back into her room for like 5 seconds. But went right back up to 106-110 average.  Patient walked around unit 300 w/o difficulty, she denied SOB or distress during ambulation.      Essential hypertension -See A. fib RVR   Chronic anticoagulation -On coumadin for hx of afib and cardioembolic stroke. -See A. fib RVR   Cardioembolic stroke ([facility]) -On coumadin. Possible Left atrial thrombus seen on echo in the past.   A. fib RVR -Unstable most likely secondary to COPD exacerbation - Cardizem 180 mg daily - Lasix 20 mg daily - 9/1 increase metoprolol 37.5 mg BID -A. fib RVR rate controlled -Coumadin per home regimen Lab Results  Component Value Date   INR 3.7 (H) 06/04/2021   INR 3.8 (H) 06/03/2021   INR 3.1 (H) 06/02/2021   INR 3.0 (H) 06/01/2021   INR 3.4 (A) 05/31/2021    Gastroesophageal reflux disease Stable.     DM type II uncontrolled  with hyperglycemia - 9/1 hemoglobin A1c= 7.8 - Metformin 1000 mg BID until patient completes steroids then return to '500mg'$  BID    Hyperlipidemia -Lipitor 40 mg daily   Hypothyroidism -Synthroid 100 mcg daily.   Status post aortic valve and mitral valve replacement -Continue Coumadin    Discharge Diagnoses:  Principal Problem:   COPD with acute exacerbation (HCC) Active Problems:   Gastroesophageal reflux disease   Hyperlipidemia   Essential hypertension   Hypothyroidism   Chronic anticoagulation   Cardioembolic stroke (HCC)   Atrial fibrillation (HCC)   Status post aortic valve and mitral valve replacement   Asthma exacerbation   Type 2 diabetes mellitus treated without insulin (HCC)   COPD exacerbation (HCC)   RSV infection   Discharge Condition: Stable  Diet recommendation: Heart healthy/carb modified  Filed Weights   06/01/21 1358 06/01/21 2300  Weight: 68 kg 77.2 kg    History of present illness:  75 year old WF PMHx  COPD (lifelong non-smoker), hypertension, A. fib, chronic anticoagulation on Coumadin, history of mitral and aortic valve replacement, reflux, hyperlipidemia    Presents with her husband today for a 1 week history of worsening shortness of breath.  Patient recently completed a prednisone taper today.  She was on 5 days worth of prednisone.  She is also p.o. doxycycline.  Majority history was provided by the patient's husband.  He states that patient had a stroke several years ago and her memory is quite poor.  Husband states the  patient was having shortness of breath and cough this past week.  Was seen by her primary care physician.  Placed on doxycycline and p.o. prednisone.  Husband states that her breathing improved only for the first day on prednisone.  Patient been using an albuterol inhaler at home.  She also uses inhaled Flovent.  Patient does not have a home nebulizer machine.  Patient's been having worsening cough.  She has had shortness of  breath at rest.  Due to the patient's wheezing at home and worsening shortness of breath, patient brought into the ER for evaluation.  In the ER, patient noted to be afebrile.  Room air saturations 93%.  Patient is COVID-negative.  BNP was stable at 114.  INR slightly elevated at 3.0.  Yesterday was 3.4.  CT of the chest was negative for acute PE.   Patient received multiple rounds of albuterol.  She also received IV Solu-Medrol.  Patient was still wheezing.  She was still coughing and feeling short of breath.   Due to the patient's unimproved respiratory status, acute COPD exacerbation, [facility] hospital[facility] contacted for admission.   Hospital Course:  See above   Cultures  8/30 SARS coronavirus negative 8/30 influenza A/B negative 9/1 positive RSV    Antibiotics Anti-infectives (From admission, onward)    Start     Ordered Stop   06/05/21 0000  azithromycin (ZITHROMAX) 250 MG tablet        06/04/21 1345     06/01/21 2115  azithromycin (ZITHROMAX) tablet 500 mg  Status:  Discontinued        06/01/21 2102 06/04/21 1924         Discharge Exam: Vitals:   06/04/21 0525 06/04/21 0600 06/04/21 0728 06/04/21 1335  BP: (!) 140/96 (!) 140/96  94/68  Pulse: 78 78  65  Resp: '17 17  18  '$ Temp: 98 F (36.7 C) 98 F (36.7 C)  98.2 F (36.8 C)  TempSrc: Oral Oral  Oral  SpO2: 98% 98% 96% 98%  Weight:      Height:       General: A/O x4, positive acute respiratory distress Eyes: negative scleral hemorrhage, negative anisocoria, negative icterus ENT: Negative Runny nose, negative gingival bleeding, Neck:  Negative scars, masses, torticollis, lymphadenopathy, JVD Lungs: diffuse expiratory wheeze, negative crackles    Discharge Instructions   Allergies as of 06/04/2021       Reactions   Amiodarone Rash        Medication List     STOP taking these medications    dextromethorphan-guaiFENesin 30-600 MG 12hr tablet Commonly known as: MUCINEX DM   doxycycline 100 MG  capsule Commonly known as: VIBRAMYCIN   doxycycline 100 MG tablet Commonly known as: VIBRA-TABS   gabapentin 100 MG capsule Commonly known as: Neurontin       TAKE these medications    albuterol 108 (90 Base) MCG/ACT inhaler Commonly known as: VENTOLIN HFA Inhale 2 puffs into the lungs every 4 (four) hours as needed for wheezing or shortness of breath.   atorvastatin 40 MG tablet Commonly known as: LIPITOR TAKE ONE TABLET BY MOUTH ONCE DAILY.   azithromycin 250 MG tablet Commonly known as: ZITHROMAX 1 tablet by mouth daily Start taking on: June 05, 2021   cetirizine 10 MG tablet Commonly known as: ZYRTEC Take 10 mg by mouth daily.   diltiazem 180 MG 24 hr capsule Commonly known as: CARDIZEM CD Take 1 capsule (180 mg total) by mouth daily.   escitalopram 10 MG tablet  Commonly known as: LEXAPRO Take 10 mg by mouth daily.   famotidine 20 MG tablet Commonly known as: Pepcid One after supper   fexofenadine 30 MG tablet Commonly known as: ALLEGRA Take 30 mg by mouth daily.   Flovent HFA 44 MCG/ACT inhaler Generic drug: fluticasone Inhale 2 puffs into the lungs 2 (two) times daily.   fluticasone 50 MCG/ACT nasal spray Commonly known as: FLONASE Place 1 spray into both nostrils daily as needed for allergies.   furosemide 20 MG tablet Commonly known as: LASIX TAKE (1) TABLET BY MOUTH ONCE DAILY FOR SWELLING. What changed: See the new instructions.   guaiFENesin-codeine 100-10 MG/5ML syrup Take 10 mLs by mouth every 6 (six) hours as needed for cough.   ipratropium 17 MCG/ACT inhaler Commonly known as: ATROVENT HFA Inhale 2 puffs into the lungs 4 (four) times daily.   levothyroxine 100 MCG tablet Commonly known as: SYNTHROID Take 100 mcg by mouth daily before breakfast.   metFORMIN 500 MG tablet Commonly known as: GLUCOPHAGE Take 1 tablet (500 mg total) by mouth 2 (two) times daily with a meal. What changed: Another medication with the same name was  added. Make sure you understand how and when to take each.   Glucophage 1000 MG tablet Generic drug: metFORMIN Take 1 tablet (1,000 mg total) by mouth 2 (two) times daily with a meal. 1 tab p.o. BID while taking prednisone.  Then return to regular dose metformin of 500 mg p.o.  BID What changed: You were already taking a medication with the same name, and this prescription was added. Make sure you understand how and when to take each.   metoprolol tartrate 25 MG tablet Commonly known as: LOPRESSOR TAKE (1) TABLET BY MOUTH TWICE DAILY. What changed: See the new instructions.   metoprolol tartrate 25 MG tablet Commonly known as: LOPRESSOR Take 0.5 tablets (12.5 mg total) by mouth 2 (two) times daily. What changed: You were already taking a medication with the same name, and this prescription was added. Make sure you understand how and when to take each.   multivitamin with minerals Tabs tablet Take 1 tablet by mouth daily.   olmesartan 5 MG tablet Commonly known as: BENICAR Take 5 mg by mouth daily.   ondansetron 4 MG tablet Commonly known as: ZOFRAN Take 1 tablet (4 mg total) by mouth every 6 (six) hours as needed for nausea.   pantoprazole 40 MG tablet Commonly known as: PROTONIX TAKE 1 TABLET 30 TO 60 MINUTES BEFORE FIRST MEAL OF THE DAY.   potassium chloride SA 20 MEQ tablet Commonly known as: KLOR-CON Take 20 mEq by mouth daily.   predniSONE 20 MG tablet Commonly known as: DELTASONE Take 2 tablets (40 mg total) by mouth daily with breakfast. Start taking on: June 05, 2021 What changed:  medication strength how much to take how to take this when to take this additional instructions Another medication with the same name was removed. Continue taking this medication, and follow the directions you see here.   PreviDent 5000 Booster Plus 1.1 % Pste Generic drug: Sodium Fluoride Place onto teeth.   warfarin 2.5 MG tablet Commonly known as: COUMADIN Take as  directed. If you are unsure how to take this medication, talk to your nurse or doctor. Original instructions: TAKE AS DIRECTED BY COUMADIN CLINIC. What changed: See the new instructions.       Allergies  Allergen Reactions   Amiodarone Rash    Follow-up Information     [name],  MD. Go in 6 day(s).   Specialty: Internal Medicine Why: You have an appointment on 06/11/21 at 315 pm for follow-up. Contact information: [address] [place] [address] [phone]                  The results of significant diagnostics from this hospitalization (including imaging, microbiology, ancillary and laboratory) are listed below for reference.    Significant Diagnostic Studies: DG Chest 2 View  Result Date: 05/26/2021 CLINICAL DATA:  Shortness of breath, chest pain. EXAM: CHEST - 2 VIEW COMPARISON:  October 28, 2020.  March 18, 2021. FINDINGS: Stable cardiomegaly. Status post cardiac valve repair. No pneumothorax is noted. Stable prominent epicardial fat pad is noted in right lung base. Based on prior CT scan, the opacity seen on lateral projection in expected position of right middle lobe appears to represent loculated pleural effusion. Left lung is unremarkable. Bony thorax unremarkable. IMPRESSION: Based on prior CT scan, the opacity seen in lateral projection in expected position of right middle lobe appears to represent loculated pleural effusion. No new abnormality is noted. Electronically Signed   By: Marijo Conception M.D.   On: 05/26/2021 09:59   CT Angio Chest PE W and/or Wo Contrast  Result Date: 06/01/2021 CLINICAL DATA:  Worsening shortness of breath x1 week. EXAM: CT ANGIOGRAPHY CHEST WITH CONTRAST TECHNIQUE: Multidetector CT imaging of the chest was performed using the standard protocol during bolus administration of intravenous contrast. Multiplanar CT image reconstructions and MIPs were obtained to evaluate the vascular anatomy. CONTRAST:  90m OMNIPAQUE IOHEXOL  350 MG/ML SOLN COMPARISON:  March 18, 2021 FINDINGS: Cardiovascular: There is mild calcification of the aortic arch. Satisfactory opacification of the pulmonary arteries to the segmental level. No evidence of pulmonary embolism. Normal heart size. No pericardial effusion. Mediastinum/Nodes: No enlarged mediastinal, hilar, or axillary lymph nodes. Thyroid gland, trachea, and esophagus demonstrate no significant findings. Lungs/Pleura: A stable subcentimeter calcified granuloma is seen within the left lower lobe. There is no evidence of acute infiltrate, pleural effusion or pneumothorax. Pleural base calcification is seen within the posterior aspect of the left lower lobe. Upper Abdomen: No acute abnormality. Musculoskeletal: Multiple sternal wires are present. Multilevel degenerative changes seen throughout the thoracic spine. Review of the MIP images confirms the above findings. IMPRESSION: 1. No evidence of pulmonary embolism or acute cardiopulmonary disease. 2. Aortic atherosclerosis. Aortic Atherosclerosis (ICD10-I70.0). Electronically Signed   By: TVirgina NorfolkM.D.   On: 06/01/2021 19:42   DG Chest Port 1 View  Result Date: 06/01/2021 CLINICAL DATA:  Cough, productive cough and shortness of breath in a 75year old female. EXAM: PORTABLE CHEST 1 VIEW COMPARISON:  May 26, 2021. FINDINGS: EKG leads project over the chest. Signs of median sternotomy with mitral and aortic valve replacement as before. Heart size remains enlarged. Hilar structures are stable. Lungs are clear. On limited assessment there is no acute skeletal process. IMPRESSION: Cardiomegaly without acute cardiopulmonary disease. Electronically Signed   By: GZetta BillsM.D.   On: 06/01/2021 17:10    Microbiology: Recent Results (from the past 240 hour(s))  Covid-19, Flu A+B ([facility])     Status: None   Collection Time: 05/26/21 10:13 AM   Specimen: Nasal Swab; Nasopharyngeal   Naso  Patient immune s  Result Value Ref Range Status    SARS-CoV-2, NAA Not Detected Not Detected Final   Influenza A, NAA Not Detected Not Detected Final   Influenza B, NAA Not Detected Not Detected Final   Test Information:  Comment  Final    Comment: This nucleic acid amplification test was developed and its performance characteristics determined by [employer]. Nucleic acid amplification tests include RT-PCR and TMA. This test has not been FDA cleared or approved. This test has been authorized by FDA under an Emergency Use Authorization (EUA). This test is only authorized for the duration of time the declaration that circumstances exist justifying the authorization of the emergency use of in vitro diagnostic tests for detection of SARS-CoV-2 virus and/or diagnosis of COVID-19 infection under section 564(b)(1) of the Act, 21 U.S.C. [id number]) (1), unless the authorization is terminated or revoked sooner. When diagnostic testing is negative, the possibility of a false negative result should be considered in the context of a patient's recent exposures and the presence of clinical signs and symptoms consistent with COVID-19. An individual without symptoms of COVID-19 and who is not shedding SARS-CoV-2 virus wo uld expect to have a negative (not detected) result in this assay.   Resp Panel by RT-PCR (Flu A&B, Covid) Nasopharyngeal Swab     Status: None   Collection Time: 05/27/21  3:32 AM   Specimen: Nasopharyngeal Swab; Nasopharyngeal(NP) swabs in vial transport medium  Result Value Ref Range Status   SARS Coronavirus 2 by RT PCR NEGATIVE NEGATIVE Final    Comment: (NOTE) SARS-CoV-2 target nucleic acids are NOT DETECTED.  The SARS-CoV-2 RNA is generally detectable in upper respiratory specimens during the acute phase of infection. The lowest concentration of SARS-CoV-2 viral copies this assay can detect is 138 copies/mL. A negative result does not preclude SARS-Cov-2 infection and should not be used as the sole basis for  treatment or other patient management decisions. A negative result may occur with  improper specimen collection/handling, submission of specimen other than nasopharyngeal swab, presence of viral mutation(s) within the areas targeted by this assay, and inadequate number of viral copies(<138 copies/mL). A negative result must be combined with clinical observations, patient history, and epidemiological information. The expected result is Negative.  Fact Sheet for Patients:  EntrepreneurPulse.com.au  Fact Sheet for Healthcare Providers:  IncredibleEmployment.be  This test is no t yet approved or cleared by the [place] FDA and  has been authorized for detection and/or diagnosis of SARS-CoV-2 by FDA under an Emergency Use Authorization (EUA). This EUA will remain  in effect (meaning this test can be used) for the duration of the COVID-19 declaration under Section 564(b)(1) of the Act, 21 U.S.C.section 360bbb-3(b)(1), unless the authorization is terminated  or revoked sooner.       Influenza A by PCR NEGATIVE NEGATIVE Final   Influenza B by PCR NEGATIVE NEGATIVE Final    Comment: (NOTE) The Xpert Xpress SARS-CoV-2/FLU/RSV plus assay is intended as an aid in the diagnosis of influenza from Nasopharyngeal swab specimens and should not be used as a sole basis for treatment. Nasal washings and aspirates are unacceptable for Xpert Xpress SARS-CoV-2/FLU/RSV testing.  Fact Sheet for Patients: EntrepreneurPulse.com.au  Fact Sheet for Healthcare Providers: IncredibleEmployment.be  This test is not yet approved or cleared by the [place] FDA and has been authorized for detection and/or diagnosis of SARS-CoV-2 by FDA under an Emergency Use Authorization (EUA). This EUA will remain in effect (meaning this test can be used) for the duration of the COVID-19 declaration under Section 564(b)(1) of the Act, 21  U.S.C. section 360bbb-3(b)(1), unless the authorization is terminated or revoked.  Performed at [facility], [address], [place], [place] [address]   Resp Panel by RT-PCR (Flu A&B,  Covid) Nasopharyngeal Swab     Status: None   Collection Time: 06/01/21  3:37 PM   Specimen: Nasopharyngeal Swab; Nasopharyngeal(NP) swabs in vial transport medium  Result Value Ref Range Status   SARS Coronavirus 2 by RT PCR NEGATIVE NEGATIVE Final    Comment: (NOTE) SARS-CoV-2 target nucleic acids are NOT DETECTED.  The SARS-CoV-2 RNA is generally detectable in upper respiratory specimens during the acute phase of infection. The lowest concentration of SARS-CoV-2 viral copies this assay can detect is 138 copies/mL. A negative result does not preclude SARS-Cov-2 infection and should not be used as the sole basis for treatment or other patient management decisions. A negative result may occur with  improper specimen collection/handling, submission of specimen other than nasopharyngeal swab, presence of viral mutation(s) within the areas targeted by this assay, and inadequate number of viral copies(<138 copies/mL). A negative result must be combined with clinical observations, patient history, and epidemiological information. The expected result is Negative.  Fact Sheet for Patients:  EntrepreneurPulse.com.au  Fact Sheet for Healthcare Providers:  IncredibleEmployment.be  This test is no t yet approved or cleared by the [place] FDA and  has been authorized for detection and/or diagnosis of SARS-CoV-2 by FDA under an Emergency Use Authorization (EUA). This EUA will remain  in effect (meaning this test can be used) for the duration of the COVID-19 declaration under Section 564(b)(1) of the Act, 21 U.S.C.section 360bbb-3(b)(1), unless the authorization is terminated  or revoked sooner.       Influenza A by PCR NEGATIVE NEGATIVE Final   Influenza B by PCR  NEGATIVE NEGATIVE Final    Comment: (NOTE) The Xpert Xpress SARS-CoV-2/FLU/RSV plus assay is intended as an aid in the diagnosis of influenza from Nasopharyngeal swab specimens and should not be used as a sole basis for treatment. Nasal washings and aspirates are unacceptable for Xpert Xpress SARS-CoV-2/FLU/RSV testing.  Fact Sheet for Patients: EntrepreneurPulse.com.au  Fact Sheet for Healthcare Providers: IncredibleEmployment.be  This test is not yet approved or cleared by the [place] FDA and has been authorized for detection and/or diagnosis of SARS-CoV-2 by FDA under an Emergency Use Authorization (EUA). This EUA will remain in effect (meaning this test can be used) for the duration of the COVID-19 declaration under Section 564(b)(1) of the Act, 21 U.S.C. section 360bbb-3(b)(1), unless the authorization is terminated or revoked.  Performed at [facility], [address] [place], [place] [address]   Respiratory (~20 pathogens) panel by PCR     Status: Abnormal   Collection Time: 06/03/21  5:45 AM   Specimen: Nasopharyngeal Swab; Respiratory  Result Value Ref Range Status   Adenovirus NOT DETECTED NOT DETECTED Final   Coronavirus 229E NOT DETECTED NOT DETECTED Final    Comment: (NOTE) The Coronavirus on the Respiratory Panel, DOES NOT test for the novel  Coronavirus (2019 nCoV)    Coronavirus HKU1 NOT DETECTED NOT DETECTED Final   Coronavirus NL63 NOT DETECTED NOT DETECTED Final   Coronavirus OC43 NOT DETECTED NOT DETECTED Final   Metapneumovirus NOT DETECTED NOT DETECTED Final   Rhinovirus / Enterovirus NOT DETECTED NOT DETECTED Final   Influenza A NOT DETECTED NOT DETECTED Final   Influenza B NOT DETECTED NOT DETECTED Final   Parainfluenza Virus 1 NOT DETECTED NOT DETECTED Final   Parainfluenza Virus 2 NOT DETECTED NOT DETECTED Final   Parainfluenza Virus 3 NOT DETECTED NOT DETECTED Final   Parainfluenza Virus 4 NOT DETECTED  NOT DETECTED Final   Respiratory Syncytial Virus DETECTED (A)  NOT DETECTED Final   Bordetella pertussis NOT DETECTED NOT DETECTED Final   Bordetella Parapertussis NOT DETECTED NOT DETECTED Final   Chlamydophila pneumoniae NOT DETECTED NOT DETECTED Final   Mycoplasma pneumoniae NOT DETECTED NOT DETECTED Final    Comment: Performed at [facility], [place] [address], [place], [place] [address]     Labs: Basic Metabolic Panel: Recent Labs  Lab 06/01/21 1554 06/02/21 0459 06/03/21 0538 06/04/21 0531  NA 132* 135 133* 135  K 4.6 4.2 4.2 4.3  CL 95* 97* 99 100  CO2 '26 25 26 26  '$ GLUCOSE 262* 249* 257* 88  BUN '22 17 21 '$ 26*  CREATININE 1.05* 0.84 0.79 0.83  CALCIUM 9.0 8.9 8.9 8.7*  MG  --  2.4 2.3 2.3  PHOS  --   --  4.2 4.4   Liver Function Tests: Recent Labs  Lab 06/01/21 1554 06/02/21 0459 06/03/21 0538 06/04/21 0531  AST '24 21 18 20  '$ ALT 34 '29 29 29  '$ ALKPHOS 59 53 49 47  BILITOT 0.6 0.5 0.6 0.8  PROT 7.3 6.6 6.4* 6.3*  ALBUMIN 4.1 3.7 3.7 3.7   No results for input(s): LIPASE, AMYLASE in the last 168 hours. No results for input(s): AMMONIA in the last 168 hours. CBC: Recent Labs  Lab 06/01/21 1554 06/02/21 0459 06/03/21 0538 06/04/21 0531  WBC 9.5 8.4 11.0* 14.5*  NEUTROABS 8.2* 7.4 9.9* 11.9*  HGB 14.0 13.4 13.0 13.5  HCT 43.5 42.5 40.8 43.3  MCV 93.8 92.8 92.5 94.7  PLT 184 151 156 163   Cardiac Enzymes: No results for input(s): CKTOTAL, CKMB, CKMBINDEX, TROPONINI in the last 168 hours. BNP: BNP (last 3 results) Recent Labs    06/01/21 1554  BNP 114.0*    ProBNP (last 3 results) No results for input(s): PROBNP in the last 8760 hours.  CBG: Recent Labs  Lab 06/03/21 1953 06/04/21 0038 06/04/21 0516 06/04/21 0724 06/04/21 1112  GLUCAP 158* 133* 84 109* 172*       Signed:  Dia Crawford, MD [facility]

## 2021-06-04 NOTE — Progress Notes (Signed)
SATURATION QUALIFICATIONS:  Patient Saturations on Room Air at Rest = 99%  Patient Saturations on Room Air while Ambulating = 97%  Patient didn't need oxygen during ambulation.

## 2021-06-04 NOTE — Progress Notes (Signed)
ANTICOAGULATION CONSULT NOTE -   Pharmacy Consult for warfarin Indication: atrial fibrillation and stroke Mitral and aortic valve replacements   Allergies  Allergen Reactions   Amiodarone Rash    Patient Measurements: Height: '5\' 2"'$  (157.5 cm) Weight: 77.2 kg (170 lb 3.1 oz) IBW/kg (Calculated) : 50.1  Vital Signs: Temp: 98 F (36.7 C) (09/02 0600) Temp Source: Oral (09/02 0600) BP: 140/96 (09/02 0600) Pulse Rate: 78 (09/02 0600)  Labs: Recent Labs    06/01/21 1554 06/01/21 1742 06/02/21 0459 06/02/21 0748 06/03/21 0538 06/04/21 0531  HGB 14.0  --  13.4  --  13.0 13.5  HCT 43.5  --  42.5  --  40.8 43.3  PLT 184  --  151  --  156 163  LABPROT 31.4*  --   --  31.8* 37.1* 36.5*  INR 3.0*  --   --  3.1* 3.8* 3.7*  CREATININE 1.05*  --  0.84  --  0.79 0.83  TROPONINIHS 13 12  --   --   --   --      Estimated Creatinine Clearance: 54.6 mL/min (by C-G formula based on SCr of 0.83 mg/dL).   Medical History: Past Medical History:  Diagnosis Date   Anemia    Asthma    Atrial fibrillation ([place])    Breast carcinoma ([place]) 1995   [id number]   Cardioembolic stroke ([place]) [id number]   Right frontal in 01/2012; normal carotid ultrasound; possible LAA thrombus by TEE; virtual complete neurologic recovery   Chronic kidney disease, stage 2, mildly decreased GFR    GFR of approximately 60   Depression    Diabetes mellitus without complication ([place])    Diverticulosis of colon (without mention of hemorrhage) 2012   Dr. Laural Golden   Fasting hyperglycemia    120 fasting   Gastroesophageal reflux disease    Gastroparesis    Hemorrhoids    Hyperlipidemia    Hypertension    pt denies 05/30/13     Dr Kathe Mariner   Hyponatremia    Rheumatic heart disease    a. 07/2013 Echo: Ef 55-60%, Mod AS/AI, Mod-Sev MS, sev dil LA, PASP 58;  b. 07/2013 TEE: EF 35-40%, mild-mod AS/AI, mod-sev MS, LA smoke;  c. 07/2013 Cath: elev R heart pressures, nl cors.   Shortness of breath     Medications:   Scheduled:   atorvastatin  40 mg Oral Daily   azithromycin  500 mg Oral Daily   diltiazem  180 mg Oral Daily   famotidine  20 mg Oral BID   furosemide  20 mg Oral Daily   insulin aspart  0-20 Units Subcutaneous Q4H   ipratropium-albuterol  3 mL Nebulization BID   irbesartan  37.5 mg Oral Daily   levothyroxine  100 mcg Oral QAC breakfast   loratadine  10 mg Oral Daily   metFORMIN  500 mg Oral BID WC   metoprolol tartrate  37.5 mg Oral BID   potassium chloride SA  20 mEq Oral Daily   predniSONE  40 mg Oral Q breakfast   Warfarin - Pharmacist Dosing Inpatient   Does not apply q1600    Assessment: Patient admitted for COPD exacerbation. Currently on coumadin for atrial fibrillation, h/o mitral & aortic valve replacement, and h/o stroke. Notable DDI include methylprednisolone/prednisone and doxycycline outpatient though not continued on admission.   Home dose listed as 3.75 mg every Sun,Tue,Thu and 2.5 mg ROW  INR 3.1 >>3.8> 3.7, still supratherapeutic   Goal of Therapy:  INR 2.5 to 3.5 Monitor platelets by anticoagulation protocol: Yes   Plan:  Hold warfarin x 1 dose. Monitor daily INR and s/s of bleeding.  Isac Sarna, BS Pharm D, [place] Clinical Pharmacist Pager [phone] 06/04/2021 10:40 AM

## 2021-06-08 ENCOUNTER — Other Ambulatory Visit: Payer: Self-pay

## 2021-06-08 ENCOUNTER — Ambulatory Visit (INDEPENDENT_AMBULATORY_CARE_PROVIDER_SITE_OTHER): Payer: Medicare PPO | Admitting: *Deleted

## 2021-06-08 DIAGNOSIS — Z5181 Encounter for therapeutic drug level monitoring: Secondary | ICD-10-CM | POA: Diagnosis not present

## 2021-06-08 DIAGNOSIS — I639 Cerebral infarction, unspecified: Secondary | ICD-10-CM | POA: Diagnosis not present

## 2021-06-08 DIAGNOSIS — I4891 Unspecified atrial fibrillation: Secondary | ICD-10-CM | POA: Diagnosis not present

## 2021-06-08 LAB — POCT INR: INR: 2.9 (ref 2.0–3.0)

## 2021-06-08 NOTE — Patient Instructions (Signed)
Continue warfarin 1 tablet daily except 1 1/2 tablets on Thursday.  Recheck INR in 1 week.  On Z pack and prednisone '40mg'$  daily  Finishes Z pack on 9/7 and prednisone on 9/10

## 2021-06-11 DIAGNOSIS — Z23 Encounter for immunization: Secondary | ICD-10-CM | POA: Diagnosis not present

## 2021-06-11 DIAGNOSIS — J441 Chronic obstructive pulmonary disease with (acute) exacerbation: Secondary | ICD-10-CM | POA: Diagnosis not present

## 2021-06-16 ENCOUNTER — Ambulatory Visit (INDEPENDENT_AMBULATORY_CARE_PROVIDER_SITE_OTHER): Payer: Medicare PPO | Admitting: *Deleted

## 2021-06-16 ENCOUNTER — Other Ambulatory Visit: Payer: Self-pay

## 2021-06-16 DIAGNOSIS — I4891 Unspecified atrial fibrillation: Secondary | ICD-10-CM | POA: Diagnosis not present

## 2021-06-16 DIAGNOSIS — Z5181 Encounter for therapeutic drug level monitoring: Secondary | ICD-10-CM | POA: Diagnosis not present

## 2021-06-16 DIAGNOSIS — I639 Cerebral infarction, unspecified: Secondary | ICD-10-CM | POA: Diagnosis not present

## 2021-06-16 LAB — POCT INR: INR: 3.5 — AB (ref 2.0–3.0)

## 2021-06-16 NOTE — Patient Instructions (Signed)
Continue warfarin 1 tablet daily except 1 1/2 tablets on Thursday.  Recheck INR in 3 weeks  Call if started on any more antibiotics or prednisone

## 2021-07-06 ENCOUNTER — Other Ambulatory Visit: Payer: Self-pay | Admitting: Internal Medicine

## 2021-07-06 ENCOUNTER — Encounter: Payer: Self-pay | Admitting: Cardiology

## 2021-07-06 DIAGNOSIS — I5032 Chronic diastolic (congestive) heart failure: Secondary | ICD-10-CM | POA: Diagnosis not present

## 2021-07-06 DIAGNOSIS — R0609 Other forms of dyspnea: Secondary | ICD-10-CM

## 2021-07-06 DIAGNOSIS — E1129 Type 2 diabetes mellitus with other diabetic kidney complication: Secondary | ICD-10-CM | POA: Diagnosis not present

## 2021-07-06 DIAGNOSIS — I480 Paroxysmal atrial fibrillation: Secondary | ICD-10-CM | POA: Diagnosis not present

## 2021-07-06 DIAGNOSIS — Z79899 Other long term (current) drug therapy: Secondary | ICD-10-CM | POA: Diagnosis not present

## 2021-07-06 DIAGNOSIS — N1831 Chronic kidney disease, stage 3a: Secondary | ICD-10-CM | POA: Diagnosis not present

## 2021-07-07 ENCOUNTER — Ambulatory Visit (INDEPENDENT_AMBULATORY_CARE_PROVIDER_SITE_OTHER): Payer: Medicare PPO | Admitting: *Deleted

## 2021-07-07 DIAGNOSIS — Z5181 Encounter for therapeutic drug level monitoring: Secondary | ICD-10-CM | POA: Diagnosis not present

## 2021-07-07 DIAGNOSIS — I4891 Unspecified atrial fibrillation: Secondary | ICD-10-CM

## 2021-07-07 DIAGNOSIS — I639 Cerebral infarction, unspecified: Secondary | ICD-10-CM

## 2021-07-07 LAB — POCT INR: INR: 2.9 (ref 2.0–3.0)

## 2021-07-07 NOTE — Patient Instructions (Signed)
Continue warfarin 1 tablet daily except 1 1/2 tablets on Thursday.  Recheck INR in 4 weeks

## 2021-07-13 DIAGNOSIS — N1831 Chronic kidney disease, stage 3a: Secondary | ICD-10-CM | POA: Diagnosis not present

## 2021-07-13 DIAGNOSIS — I5032 Chronic diastolic (congestive) heart failure: Secondary | ICD-10-CM | POA: Diagnosis not present

## 2021-07-13 DIAGNOSIS — E1122 Type 2 diabetes mellitus with diabetic chronic kidney disease: Secondary | ICD-10-CM | POA: Diagnosis not present

## 2021-07-13 DIAGNOSIS — R7309 Other abnormal glucose: Secondary | ICD-10-CM | POA: Diagnosis not present

## 2021-07-19 ENCOUNTER — Other Ambulatory Visit: Payer: Self-pay

## 2021-07-19 ENCOUNTER — Encounter: Payer: Self-pay | Admitting: Cardiology

## 2021-07-19 ENCOUNTER — Ambulatory Visit: Payer: Medicare PPO | Admitting: Cardiology

## 2021-07-19 ENCOUNTER — Other Ambulatory Visit: Payer: Self-pay | Admitting: Cardiology

## 2021-07-19 VITALS — BP 122/54 | HR 68 | Ht 62.5 in | Wt 172.2 lb

## 2021-07-19 DIAGNOSIS — I38 Endocarditis, valve unspecified: Secondary | ICD-10-CM

## 2021-07-19 DIAGNOSIS — E782 Mixed hyperlipidemia: Secondary | ICD-10-CM

## 2021-07-19 DIAGNOSIS — I4891 Unspecified atrial fibrillation: Secondary | ICD-10-CM

## 2021-07-19 NOTE — Patient Instructions (Signed)
Medication Instructions:  Your physician recommends that you continue on your current medications as directed. Please refer to the Current Medication list given to you today.  *If you need a refill on your cardiac medications before your next appointment, please call your pharmacy*   Lab Work: None today If you have labs (blood work) drawn today and your tests are completely normal, you will receive your results only by: MyChart Message (if you have MyChart) OR A paper copy in the mail If you have any lab test that is abnormal or we need to change your treatment, we will call you to review the results.   Testing/Procedures: None today   Follow-Up: At [facility], you and your health needs are our priority.  As part of our continuing mission to provide you with exceptional heart care, we have created designated Provider Care Teams.  These Care Teams include your primary Cardiologist (physician) and Advanced Practice Providers (APPs -  Physician Assistants and Nurse Practitioners) who all work together to provide you with the care you need, when you need it.  We recommend signing up for the patient portal called "MyChart".  Sign up information is provided on this After Visit Summary.  MyChart is used to connect with patients for Virtual Visits (Telemedicine).  Patients are able to view lab/test results, encounter notes, upcoming appointments, etc.  Non-urgent messages can be sent to your provider as well.   To learn more about what you can do with MyChart, go to [email or web address].    Your next appointment:   6 month(s)  The format for your next appointment:   In Person  Provider:   Jonathan Branch, MD   Other Instructions None     

## 2021-07-19 NOTE — Progress Notes (Signed)
Clinical Summary [name] is a 75 y.o.female seen today for the following medical problems.      1. Valvular heart disease   - 07/18/13 patient underwent MVR with 25 mm Edwards Magna-ease pericardial valve and also AVR with 21 mm Edwards Magna-ease pericardial valve      - has had some issues with amoxicillin in the past, upset stomach and yeast infection when having to take prior to dental surgery due to her artificial valves.      - chroinc stable SOB/DOE, also being managed for asthma by pulmonary    2. Chest tightness - midchest, comes on activity.  - 5/10 in severity. Some associated SOB - tightness last few seconds. Not positonal. Better with rest   01/2021 nuclear stress no clear ischemia - mild infrequent symptoms.    3. Afib   - hx of cardioembolic CVA, has been on longterm anticoag - compliant wit m - no palpitations  - heart rates were elevatd during recent admission with COPD exacberation. Lopressor was increased to 37.5mg  bid   No recent palpitations - no bleeding on coumadin - she is on lopressor 37.5mg  bid, diltiazem 180mg       4. Hyperlipidemia - recent labs with pcp, compliant with stin  06/2021 TC 185 TG 75 HDL 66 LDL 104     5. Carotid stenosis - 07/2013 [place] with mild bilateral stenosis 2018 mild disease - 01/2021 [place] mild bilateral disease     6. Syncope - admission 04/2020 with syncope - no acute findings by echo - CT head benign - Cr was elevated to 1.49   - weight loss - orthostatics show significant rise in HR with standing, HR lying 60 to 112 standing. Soft bp's that are low with standing to 86/50.    - ongoing fatigue x 3 months - some cough, SOB   - in garden picking beans, not sure how long. In the 90s that day - denies any significant prodrome.    - glassses x 4, coffee x 1 cup decaf, diet sun drop x 2 cans, decaf tea, no EtoH - chronic issues with orthostatic dizziness with standing.    - we stopped her benicar and  changed her lasix to 20mg  prn due to this episode - now back to lasix 20mg  daily due to increased swelling on just prn dosing.     7. SOB - admission Aug/Sept 2022, RSV + DOE since spring.    SH: daughter with Crohns with disease, they spend a lot of time supporting her   Past Medical History:  Diagnosis Date   Anemia    Asthma    Atrial fibrillation ([place])    Breast carcinoma ([place]) 1995   [id number]   Cardioembolic stroke ([place]) 11/9935   Right frontal in 01/2012; normal carotid ultrasound; possible LAA thrombus by TEE; virtual complete neurologic recovery   Chronic kidney disease, stage 2, mildly decreased GFR    GFR of approximately 60   Depression    Diabetes mellitus without complication ([place])    Diverticulosis of colon (without mention of hemorrhage) 2012   Dr. Laural Golden   Fasting hyperglycemia    120 fasting   Gastroesophageal reflux disease    Gastroparesis    Hemorrhoids    Hyperlipidemia    Hypertension    pt denies 05/30/13     Dr Kathe Mariner   Hyponatremia    Rheumatic heart disease    a. 07/2013 Echo: Ef 55-60%, Mod AS/AI, Mod-Sev MS,  sev dil LA, PASP 58;  b. 07/2013 TEE: EF 35-40%, mild-mod AS/AI, mod-sev MS, LA smoke;  c. 07/2013 Cath: elev R heart pressures, nl cors.   Shortness of breath      Allergies  Allergen Reactions   Amiodarone Rash     Current Outpatient Medications  Medication Sig Dispense Refill   famotidine (PEPCID) 20 MG tablet TAKE 1 TABLET AFTER SUPPER 30 tablet 0   albuterol (VENTOLIN HFA) 108 (90 Base) MCG/ACT inhaler Inhale 2 puffs into the lungs every 4 (four) hours as needed for wheezing or shortness of breath.      atorvastatin (LIPITOR) 40 MG tablet TAKE ONE TABLET BY MOUTH ONCE DAILY. 90 tablet 1   azithromycin (ZITHROMAX) 250 MG tablet 1 tablet by mouth daily 3 each 0   cetirizine (ZYRTEC) 10 MG tablet Take 10 mg by mouth daily.     diltiazem (CARDIZEM CD) 180 MG 24 hr capsule Take 1 capsule (180 mg total) by mouth daily. 30  capsule 1   escitalopram (LEXAPRO) 10 MG tablet Take 10 mg by mouth daily.      fexofenadine (ALLEGRA) 30 MG tablet Take 30 mg by mouth daily.  (Patient not taking: Reported on 06/01/2021)     FLOVENT HFA 44 MCG/ACT inhaler Inhale 2 puffs into the lungs 2 (two) times daily.     fluticasone (FLONASE) 50 MCG/ACT nasal spray Place 1 spray into both nostrils daily as needed for allergies.      furosemide (LASIX) 20 MG tablet TAKE (1) TABLET BY MOUTH ONCE DAILY FOR SWELLING. 90 tablet 1   GLUCOPHAGE 1000 MG tablet Take 1 tablet (1,000 mg total) by mouth 2 (two) times daily with a meal. 1 tab p.o. BID while taking prednisone.  Then return to regular dose metformin of 500 mg p.o.  BID 14 tablet 0   guaiFENesin-codeine 100-10 MG/5ML syrup Take 10 mLs by mouth every 6 (six) hours as needed for cough. 120 mL 0   ipratropium (ATROVENT HFA) 17 MCG/ACT inhaler Inhale 2 puffs into the lungs 4 (four) times daily. 1 each 0   levothyroxine (SYNTHROID) 100 MCG tablet Take 100 mcg by mouth daily before breakfast.      metFORMIN (GLUCOPHAGE) 500 MG tablet Take 1 tablet (500 mg total) by mouth 2 (two) times daily with a meal. 60 tablet 1   metoprolol tartrate (LOPRESSOR) 25 MG tablet TAKE (1) TABLET BY MOUTH TWICE DAILY. (Patient taking differently: Take 25 mg by mouth 2 (two) times daily.) 180 tablet 2   metoprolol tartrate (LOPRESSOR) 25 MG tablet Take 0.5 tablets (12.5 mg total) by mouth 2 (two) times daily. 60 tablet 0   Multiple Vitamin (MULITIVITAMIN WITH MINERALS) TABS Take 1 tablet by mouth daily.     olmesartan (BENICAR) 5 MG tablet Take 5 mg by mouth daily.     ondansetron (ZOFRAN) 4 MG tablet Take 1 tablet (4 mg total) by mouth every 6 (six) hours as needed for nausea. 20 tablet 0   pantoprazole (PROTONIX) 40 MG tablet TAKE 1 TABLET 30 TO 60 MINUTES BEFORE FIRST MEAL OF THE DAY. (Patient not taking: No sig reported) 30 tablet 5   potassium chloride SA (K-DUR,KLOR-CON) 20 MEQ tablet Take 20 mEq by mouth daily.      predniSONE (DELTASONE) 20 MG tablet Take 2 tablets (40 mg total) by mouth daily with breakfast. 14 tablet 0   PREVIDENT 5000 BOOSTER PLUS 1.1 % PSTE Place onto teeth.     warfarin (COUMADIN) 2.5 MG tablet  TAKE AS DIRECTED BY COUMADIN CLINIC. (Patient taking differently: Take 2.5 mg by mouth. TAKE AS DIRECTED BY COUMADIN CLINIC.Take 2.5 mg every day except Thursdays 1 and 1/2 tablet) 45 tablet 6   No current facility-administered medications for this visit.     Past Surgical History:  Procedure Laterality Date   AORTIC VALVE REPLACEMENT N/A 07/18/2013   Procedure: AORTIC VALVE REPLACEMENT (AVR);  Surgeon: Gaye Pollack, MD;  Location: [facility];  Service: Open Heart Surgery;  Laterality: N/A;   BACK SURGERY     BREAST LUMPECTOMY Right 1995   CARDIAC CATHETERIZATION     CARDIOVERSION N/A 07/26/2013   Procedure: CARDIOVERSION;  Surgeon: Fay Records, MD;  Location: [facility];  Service: Cardiovascular;  Laterality: N/A;   COLONOSCOPY  2012   Negative screening procedure   DILATION AND CURETTAGE OF UTERUS     ESOPHAGEAL MANOMETRY N/A 06/17/2013   Procedure: ESOPHAGEAL MANOMETRY (EM);  Surgeon: Sable Feil, MD;  Location: [facility];  Service: Endoscopy;  Laterality: N/A;   INTRAOPERATIVE TRANSESOPHAGEAL ECHOCARDIOGRAM N/A 07/18/2013   Procedure: INTRAOPERATIVE TRANSESOPHAGEAL ECHOCARDIOGRAM;  Surgeon: Gaye Pollack, MD;  Location: [facility];  Service: Open Heart Surgery;  Laterality: N/A;   LEFT AND RIGHT HEART CATHETERIZATION WITH CORONARY ANGIOGRAM N/A 07/10/2013   Procedure: LEFT AND RIGHT HEART CATHETERIZATION WITH CORONARY ANGIOGRAM;  Surgeon: Burnell Blanks, MD;  Location: [facility] CATH LAB;  Service: Cardiovascular;  Laterality: N/A;   LUMBAR LAMINECTOMY/DECOMPRESSION MICRODISCECTOMY Right 05/13/2014   Procedure: LUMBAR LAMINECTOMY/DECOMPRESSION MICRODISCECTOMY 1 LEVEL  lumbar four/five;  Surgeon: Faythe Ghee, MD;  Location: [facility];  Service: Neurosurgery;  Laterality:  Right;   MITRAL VALVE REPLACEMENT N/A 07/18/2013   Procedure: MITRAL VALVE (MV) REPLACEMENT;  Surgeon: Gaye Pollack, MD;  Location: [facility];  Service: Open Heart Surgery;  Laterality: N/A;   MITRAL VALVE [place], closed mitral valvulotomy by finger fracture   TEE WITHOUT CARDIOVERSION  01/24/2012   Procedure: TRANSESOPHAGEAL ECHOCARDIOGRAM (TEE);  Surgeon: Lelon Perla, MD;  Location: [facility] ENDOSCOPY;  Service: Cardiovascular;  Laterality: N/A;   TEE WITHOUT CARDIOVERSION N/A 07/11/2013   Procedure: TRANSESOPHAGEAL ECHOCARDIOGRAM (TEE);  Surgeon: Thayer Headings, MD;  Location: [facility];  Service: Cardiovascular;  Laterality: N/A;   TEE WITHOUT CARDIOVERSION N/A 07/26/2013   Procedure: TRANSESOPHAGEAL ECHOCARDIOGRAM (TEE);  Surgeon: Fay Records, MD;  Location: [facility] ENDOSCOPY;  Service: Cardiovascular;  Laterality: N/A;   TUBAL LIGATION  1973     Allergies  Allergen Reactions   Amiodarone Rash      Family History  Problem Relation Age of Onset   Heart disease Brother 37       MI   Rheum arthritis Maternal Grandmother    Asthma Maternal Grandfather    Hypothyroidism Mother    Diabetes Sister    Cirrhosis Father      Social History Ms. Grussing reports that she has never smoked. She has never used smokeless tobacco. Ms. Febres reports no history of alcohol use.   Review of Systems CONSTITUTIONAL: No weight loss, fever, chills, weakness or fatigue.  HEENT: Eyes: No visual loss, blurred vision, double vision or yellow sclerae.No hearing loss, sneezing, congestion, runny nose or sore throat.  SKIN: No rash or itching.  CARDIOVASCULAR: per hpi RESPIRATORY: No shortness of breath, cough or sputum.  GASTROINTESTINAL: No anorexia, nausea, vomiting or diarrhea. No abdominal pain or blood.  GENITOURINARY: No burning on urination, no polyuria NEUROLOGICAL: No headache, dizziness, syncope, paralysis, ataxia, numbness  or tingling in the extremities. No change in  bowel or bladder control.  MUSCULOSKELETAL: No muscle, back pain, joint pain or stiffness.  LYMPHATICS: No enlarged nodes. No history of splenectomy.  PSYCHIATRIC: No history of depression or anxiety.  ENDOCRINOLOGIC: No reports of sweating, cold or heat intolerance. No polyuria or polydipsia.  Marland Kitchen   Physical Examination Today's Vitals   07/19/21 1022  BP: (!) 122/54  Pulse: 68  SpO2: 94%  Weight: 172 lb 3.2 oz (78.1 kg)  Height: 5' 2.5" (1.588 m)   Body mass index is 30.99 kg/m.  Gen: resting comfortably, no acute distress HEENT: no scleral icterus, pupils equal round and reactive, no palptable cervical adenopathy,  CV: RRR, no mrg, no jvd Resp: Clear to auscultation bilaterally GI: abdomen is soft, non-tender, non-distended, normal bowel sounds, no hepatosplenomegaly MSK: extremities are warm, 1+ bilateral LE edema Skin: warm, no rash Neuro:  no focal deficits Psych: appropriate affect   Diagnostic Studies  07/2013 TTE Study Conclusions  - Study data: Technically difficult study. - Left ventricle: The cavity size was normal. Wall thickness   was normal. Systolic function was normal. The estimated   ejection fraction was in the range of 55% to 60%.   Indeterminate diastolic function. There is evidence of   very high left atrial pressures (E/e' 63) - Aortic valve: Trileaflet; mildly thickened leaflets.   Accurate evaluation of aortic stenosis limited in the   setting of severe mitral stenosis and decreased   ventricular loadin. Morphologically there does not appear   to be significant stenosis. By gradient (mean PG 21 mmHg)   the stenosis is moderate, by AVA VTI (0.8) stenosis is   severe. Moderate regurgitation, AR PHT is 439 cm/s. Valve   area: 0.78cm^2(VTI). Valve area: 0.78cm^2 (Vmax). - Mitral valve: The findings are consistent with moderate to   severe stenosis. (Mean PG 12 mmHg, MVA by PHT 1.3. Cannot   use MVA by VTI due to moderate AI. Valve area by  pressure   half-time: 1.38cm^2. - Left atrium: The atrium was severely dilated. - Right ventricle: Systolic function was mildly reduced, RV   TAPSE is 1.4, TV systolic annular tissue velocity 10 cm/s. - Pulmonary arteries: Systolic pressure was moderately   increased. PA peak pressure: 34mm Hg (S) (PASP based on a   fairly faint spectral Doppler waveform)     07/2013 TEE Study Conclusions  - Left ventricle: Systolic function was moderately reduced.   The estimated ejection fraction was in the range of 35% to   40%. - Aortic valve: There was mild to moderate stenosis. Mild to   moderate regurgitation. - Mitral valve: The findings are consistent with moderate to   severe stenosis. Valve area by pressure half-time:   1.45cm^2. - Left atrium: There was spontaneous echo contrast   ("smoke"). - Atrial septum: No defect or patent foramen ovale was   identified. - Tricuspid valve: No evidence of vegetation. Transesophageal echocardiography.  2D and color Doppler. Height:  Height: 157.5cm. Height: 62in.  Weight:  Weight: 75kg. Weight: 165lb.  Body mass index:  BMI: 30.2kg/m^2. Body surface area:    BSA: 1.58m^2.  Blood pressure: 143/56.  Patient status:  Inpatient.  Location:  Endoscopy.      06/2018 echo Study Conclusions   - Left ventricle: The cavity size was normal. Wall thickness was   normal. Systolic function was normal. The estimated ejection   fraction was in the range of 60% to 65%. - Aortic valve: AV prosthesis  is difficult to see Peak and mean   gradients through the valve are 18 and 10 mm Hg respectively. - Mitral valve: Peak and mean gradients through the valve   prosthesis are 19 and 4 mm Hg respectively MVA by P T1/2 is 3   cm2. s/p MV prosthesis. Valve prosthesis is difficult to see. - Pulmonary arteries: PA peak pressure: 48 mm Hg (S).     04/2020 echo IMPRESSIONS     1. Left ventricular ejection fraction, by estimation, is 60 to 65%. The  left ventricle has  normal function. The left ventricle has no regional  wall motion abnormalities. Left ventricular diastolic parameters are  indeterminate.   2. Right ventricular systolic function is low normal. The right  ventricular size is normal. There is moderately elevated pulmonary artery  systolic pressure. The estimated right ventricular systolic pressure is  29.4 mmHg.   3. Left atrial size was mildly dilated.   4. The mitral valve has been repaired/replaced. No evidence of mitral  valve regurgitation. The mean mitral valve gradient is 5.0 mmHg. There is  a 25 mm Magna Ease bioprosthetic valve present in the mitral position.  Grossly normal function.   5. The aortic valve has been repaired/replaced. Aortic valve  regurgitation is not visualized. There is a 21 mm Magna Ease bioprosthetic  valve present in the aortic position. Aortic valve mean gradient measures  7.0 mmHg. Grossly normal function.   6. The inferior vena cava is normal in size with greater than 50%  respiratory variability, suggesting right atrial pressure of 3 mmHg.     07/2013 Cath Impression: 1. No angiographic evidence of CAD 2. Elevated filling pressures   01/2021 nuclear stress  Atrial fibrillation present throughout. No diagnostic ST segment changes to indicate ischemia. Small, mild to moderate intensity, mid to apical anterior defect. This is fixed with the exception of a small degree of partial reversibility in the mid zone. Suggestive of scar versus variable soft tissue attenuation. This is a low risk study. Nuclear stress EF: 50%.  01/2021 carotid US Summary:  Right Carotid: Velocities in the right ICA are consistent with a 1-39%  stenosis.   Left Carotid: Velocities in the left ICA are consistent with a 1-39%  stenosis.   Vertebrals:  Bilateral vertebral arteries demonstrate antegrade flow.  Subclavians: Normal flow hemodynamics were seen in bilateral subclavian               arteries.    Assessment and  Plan  1. Valvular heart disease   - overall doing well, conitnue to monitor. Continue diuretic.      2. Afib    - hx of prior cardioembolic CVA, has been continued on coumadin.  -no recent issues, conitnue current meds   3. Hyperlipidemia -at goal, continue atorvastatin   4. Chest pain - negative stress test recently, continue to monitor.      Arnoldo Lenis, M.D

## 2021-07-22 ENCOUNTER — Other Ambulatory Visit: Payer: Self-pay

## 2021-07-22 ENCOUNTER — Ambulatory Visit: Payer: Medicare PPO | Admitting: Internal Medicine

## 2021-07-22 ENCOUNTER — Encounter: Payer: Self-pay | Admitting: Internal Medicine

## 2021-07-22 DIAGNOSIS — R058 Other specified cough: Secondary | ICD-10-CM | POA: Diagnosis not present

## 2021-07-22 LAB — CBC WITH DIFFERENTIAL/PLATELET
Basophils Absolute: 0.1 10*3/uL (ref 0.0–0.1)
Basophils Relative: 1.1 % (ref 0.0–3.0)
Eosinophils Absolute: 0.2 10*3/uL (ref 0.0–0.7)
Eosinophils Relative: 2.6 % (ref 0.0–5.0)
HCT: 39.2 % (ref 36.0–46.0)
Hemoglobin: 12.8 g/dL (ref 12.0–15.0)
Lymphocytes Relative: 18.6 % (ref 12.0–46.0)
Lymphs Abs: 1.3 10*3/uL (ref 0.7–4.0)
MCHC: 32.7 g/dL (ref 30.0–36.0)
MCV: 92.3 fl (ref 78.0–100.0)
Monocytes Absolute: 0.6 10*3/uL (ref 0.1–1.0)
Monocytes Relative: 8.8 % (ref 3.0–12.0)
Neutro Abs: 5 10*3/uL (ref 1.4–7.7)
Neutrophils Relative %: 68.9 % (ref 43.0–77.0)
Platelets: 200 10*3/uL (ref 150.0–400.0)
RBC: 4.25 Mil/uL (ref 3.87–5.11)
RDW: 16.3 % — ABNORMAL HIGH (ref 11.5–15.5)
WBC: 7.2 10*3/uL (ref 4.0–10.5)

## 2021-07-22 MED ORDER — METHYLPREDNISOLONE ACETATE 80 MG/ML IJ SUSP
120.0000 mg | Freq: Once | INTRAMUSCULAR | Status: AC
  Administered 2021-07-22: 120 mg via INTRAMUSCULAR

## 2021-07-22 MED ORDER — PANTOPRAZOLE SODIUM 40 MG PO TBEC: 40.0000 mg | DELAYED_RELEASE_TABLET | Freq: Every day | ORAL | 2 refills | Status: DC

## 2021-07-22 MED ORDER — GABAPENTIN 100 MG PO CAPS: 100.0000 mg | ORAL_CAPSULE | Freq: Four times a day (QID) | ORAL | 2 refills | Status: DC

## 2021-07-22 MED ORDER — BUDESONIDE-FORMOTEROL FUMARATE 80-4.5 MCG/ACT IN AERO: INHALATION_SPRAY | RESPIRATORY_TRACT | 12 refills | Status: DC

## 2021-07-22 MED ORDER — PREDNISONE 10 MG PO TABS: ORAL_TABLET | ORAL | 0 refills | Status: DC

## 2021-07-22 MED ORDER — ALBUTEROL SULFATE (2.5 MG/3ML) 0.083% IN NEBU: 2.5000 mg | INHALATION_SOLUTION | RESPIRATORY_TRACT | 12 refills | Status: DC | PRN

## 2021-07-22 NOTE — Patient Instructions (Addendum)
Pantoprazole (protonix) 40 mg   Take  30-60 min before first meal of the day and Pepcid (famotidine)  20 mg after supper until return to office - this is the best way to tell whether stomach acid is contributing to your problem.   Gabapentin 100 mg four times daily   Plan A = Automatic = Always=   Symbicort 160 mg  Take 2 puffs first thing in am and then another 2 puffs about 12 hours later. (Stop flovent and atrovent- if can't afford symbicort on your plan)  Plan B = Backup (to supplement plan A, not to replace it) Only use your albuterol inhaler as a rescue medication to be used if you can't catch your breath by resting or doing a relaxed purse lip breathing pattern.  - The less you use it, the better it will work when you need it. - Ok to use the inhaler up to 2 puffs  every 4 hours if you must but call for appointment if use goes up over your usual need - Don't leave home without it !!  (think of it like the spare tire for your car)   Plan C = Crisis (instead of Plan B but only if Plan B stops working) - only use your albuterol nebulizer if you first try Plan B and it fails to help > ok to use the nebulizer up to every 4 hours but if start needing it regularly call for immediate appointment  For cough > mucinex dm 1200 every 12 hours as needed   Please schedule a follow up office visit in 6 weeks in [place] - call sooner if needed

## 2021-07-22 NOTE — Progress Notes (Signed)
[name], female    DOB: [birth year]      MRN: [id number]   Brief patient profile:  64  yowf never smoker with a h/o sob related to valvular heart dz    Seen prior to Epic in [place]: Feb 20, 2009 initial pulmonary eval started aerobics 3 months prior to OV  X 30 min weekly and tolerated well with improving ex tol until 3 weeks prior to OV   noted decrease tolerance and since then gradually worse to point where walking to mailbox slt uphill or climbing steps has to stop at top.  In retrospect coughing for 3 months dry, worse immediately on lying down. due to the cough lisnopril was stopped and could not tolerate the substitute and cough hasn't changed but breathing got worse. prednisone no help so far and proaire no benefit. rec GERD diet   continue prilosec and reglan stop prednisone and proaire  avoid salt  maxzide 25 one each am and Dr Izell Falcon office   10/16/12 Meth challenge  + for hyperreactive airways disease  clance eval 05/14/13 [name] that at this point is clearly upper airway in origin.  [name], but her current cough failed to respond to prednisone tapers.  Therefore it is unlikely that it is related to cough variant asthma.  She does not have chronic sinusitis by her CT scan, and there is nothing on chest x-ray earlier in the year to explain her cough.  She does have a history of breast cancer, but there has been no evidence for recurrence.  [name], but this has an extremely low yield.  [name], and therefore I am concerned she may be having laryngopharyngeal reflux as a cause for her cough.  I also think she may have a cyclical mechanism as well she did have some response to tramadol, and we could consider a trial of gabapentin  AVR  07/18/13   04/2020 echo  1. Left ventricular ejection fraction, by estimation, is 60 to 65%. The   left ventricle has normal function. The left ventricle has no regional  wall motion abnormalities. Left ventricular diastolic parameters are  indeterminate.   2. Right ventricular systolic function is low normal. The right  ventricular size is normal. There is moderately elevated pulmonary artery  systolic pressure. The estimated right ventricular systolic pressure is  42.3 mmHg.   3. Left atrial size was mildly dilated.   4. The mitral valve has been repaired/replaced. No evidence of mitral  valve regurgitation. The mean mitral valve gradient is 5.0 mmHg. There is  a 25 mm Magna Ease bioprosthetic valve present in the mitral position.  Grossly normal function.   5. The aortic valve has been repaired/replaced. Aortic valve  regurgitation is not visualized. There is a 21 mm Magna Ease bioprosthetic  valve present in the aortic position. Aortic valve mean gradient measures  7.0 mmHg. Grossly normal function.   6. The inferior vena cava is normal in size with greater than 50%  respiratory variability, suggesting right atrial pressure of 3 mmHg.        History of Present Illness  06/23/2020  Pulmonary/ 1st office eval/ [name] / [employer] [facility] / re establish re cough and sob on flovent 44 2bid  For cough variant asthma  Chief Complaint  Patient presents with   Consult    Patient has  a dry cough that has been going on for many years but worse over the last 6 months. Shortness of breath with exertion.   Dyspnea:  Walks dog x 30 min / mostly flat uses cane to balance/ steps ok slowly due to breathing problems x sev years prior to OV   Cough: worse in afternoons and sometimes hs has to sit up 30 degrees to prevent cough supine  Sleep: ok as long as head is up 30 degrees new x 2 years  SABA use: sev times a day seems to help at 4 pm rec GERD diet  Pantoprazole (protonix) 40 mg   Take  30-60 min before first meal of the day and Pepcid (famotidine)  20 mg one after supper until return to  office - this is the best way to tell whether stomach acid is contributing to your problem.   Plan A = Automatic = Always=    flovent Take 2 puffs first thing in am and then another 2 puffs about 12 hours later.  Work on inhaler technique:  Plan B = Backup (to supplement plan A, not to replace it) Only use your albuterol inhaler as a rescue medication   Please schedule a follow up office visit in 6 weeks, call sooner if needed - bring inhalers  Next step ? Gabapentin?   08/05/2020  f/u ov/[place] office/[name] re: cough > sob x years  Chief Complaint  Patient presents with   Follow-up    non productive cough   Dyspnea:  Not limited by breathing from desired activities  / plays golf/ walks dog  Cough: dry ? Worse with activity (husband says not reproducible ?  worse p supper  Sleeping: most nights waking up  SABA use: once a day  02: none  rec Depomedrol 120 mg IM  Plan A = Automatic = Always=    symbicort 80 Take 2 puffs first thing in am and then another 2 puffs about 12 hours later.  Work on inhaler technique:     Plan B = Backup (to supplement plan A, not to replace it) Only use your albuterol inhaler as a rescue medication   Please schedule a follow up office visit in 6 weeks, call sooner if needed  - bring inhalers and the empty cannister of symbicort    09/16/2020  f/u ov/[place] office/[name] re: cough  x years ? Decades "nothing has ever helped" no better p depo last ov, not ever transiently  Chief Complaint  Patient presents with   Follow-up    Cough is unchanged since the last visit- prod with white sputum.  She is using her albuterol inhaler once per day usually around 4-5 pm.    Dyspnea:  Walks dog, uses cane / some balance issues limiting more than sob and still plays golf Cough: worse  p supper despite pepcid Sleeping: does better in recliner 30 degrees  SABA use: once a day around 5pm seems to help though note hfa very poor baseline  02: none Work on inhaler  technique:   Gabapentin 100 mg twice a day for a week then 4 x daily  Please remember to go to the  x-ray department  @  [facility] for your tests - we will call you with the results when they are available    Add Needs f/u cxr  ? rml syndrome p rx mucinex dm > then ct chest/ sinus if not better  and check allergy profile on return  10/29/2020  f/u ov/[place] office/Malisha Mabey re: chronic cough  Chief Complaint  Patient presents with   Followup     Cough is unchanged since the last visit. She has used her albuterol inhaler twice since her last visit.   Dyspnea:  Slowed more balance than breathing, walks with suction cup cane Cough: dry cough day > noct Sleeping: able to sleep sitting up 30 degrees s sob or cough  SABA use: rare 02: none  Rec We will call to schedule you CT sinus (neg) and chest = ? Scarring from Breast RT     07/22/2021  f/u ov/Chasty Randal re: cough > sob    maint on flovent  for dx of MCT pos cough variant asthma / stopped gabapentin ? Why ? Chief Complaint  Patient presents with   Follow-up    Productive cough with thick white mucus   Dyspnea:  mailbox and back is 200 yards slow pace Cough: daytime cough / lots of throat clearing  Sleeping: 30 degrees ok  SABA use: using saba bid/ poor flovent use  02: none  Covid status:   vax x 5    No obvious day to day or daytime variability or assoc excess/ purulent sputum or mucus plugs or hemoptysis or cp or chest tightness, subjective wheeze or overt sinus or hb symptoms.   sleeping without nocturnal  or early am exacerbation  of respiratory  c/o's or need for noct saba. Also denies any obvious fluctuation of symptoms with weather or environmental changes or other aggravating or alleviating factors except as outlined above   No unusual exposure hx or h/o childhood pna/ asthma or knowledge of premature birth.  Current Allergies, Complete Past Medical History, Past Surgical History, Family History, and Social  History were reviewed in [employer] record.  ROS  The following are not active complaints unless bolded Hoarseness, sore throat, dysphagia, dental problems, itching, sneezing,  nasal congestion or discharge of excess mucus or purulent secretions, ear ache,   fever, chills, sweats, unintended wt loss or wt gain, classically pleuritic or exertional cp,  orthopnea pnd or arm/hand swelling  or leg swelling, presyncope, palpitations, abdominal pain, anorexia, nausea, vomiting, diarrhea  or change in bowel habits or change in bladder habits, change in stools or change in urine, dysuria, hematuria,  rash, arthralgias, visual complaints, headache, numbness, weakness or ataxia or problems with walking or coordination,  change in mood or  memory.        Current Meds  Medication Sig   albuterol (VENTOLIN HFA) 108 (90 Base) MCG/ACT inhaler Inhale 2 puffs into the lungs every 4 (four) hours as needed for wheezing or shortness of breath.    atorvastatin (LIPITOR) 40 MG tablet TAKE ONE TABLET BY MOUTH ONCE DAILY.   cetirizine (ZYRTEC) 10 MG tablet Take 10 mg by mouth daily.   diltiazem (CARDIZEM CD) 180 MG 24 hr capsule Take 1 capsule (180 mg total) by mouth daily.   escitalopram (LEXAPRO) 10 MG tablet Take 10 mg by mouth daily.    famotidine (PEPCID) 20 MG tablet TAKE 1 TABLET AFTER SUPPER   FLOVENT HFA 44 MCG/ACT inhaler Inhale 2 puffs into the lungs 2 (two) times daily.   fluticasone (FLONASE) 50 MCG/ACT nasal spray Place 1 spray into both nostrils daily as needed for allergies.    furosemide (LASIX) 20 MG tablet TAKE (1) TABLET BY MOUTH ONCE DAILY FOR SWELLING.   ipratropium (ATROVENT HFA) 17 MCG/ACT inhaler Inhale 2 puffs into the lungs 4 (four)  times daily.   levothyroxine (SYNTHROID) 100 MCG tablet Take 100 mcg by mouth daily before breakfast.   metFORMIN (GLUCOPHAGE) 500 MG tablet Take 1 tablet (500 mg total) by mouth 2 (two) times daily with a meal.   metoprolol tartrate  (LOPRESSOR) 25 MG tablet TAKE (1) TABLET BY MOUTH TWICE DAILY.   metoprolol tartrate (LOPRESSOR) 25 MG tablet Take 0.5 tablets (12.5 mg total) by mouth 2 (two) times daily.   Multiple Vitamin (MULITIVITAMIN WITH MINERALS) TABS Take 1 tablet by mouth daily.   olmesartan (BENICAR) 5 MG tablet Take 5 mg by mouth daily.   ondansetron (ZOFRAN) 4 MG tablet Take 1 tablet (4 mg total) by mouth every 6 (six) hours as needed for nausea.   potassium chloride SA (K-DUR,KLOR-CON) 20 MEQ tablet Take 20 mEq by mouth daily.   PREVIDENT 5000 BOOSTER PLUS 1.1 % PSTE Place onto teeth.   warfarin (COUMADIN) 2.5 MG tablet TAKE AS DIRECTED BY COUMADIN CLINIC.            Past Medical History:  Diagnosis Date   Anemia    Atrial fibrillation ([place])    Breast carcinoma ([place]) 1995   6440   Cardioembolic stroke ([place]) 12/4740   Right frontal in 01/2012; normal carotid ultrasound; possible LAA thrombus by TEE; virtual complete neurologic recovery   Chronic kidney disease, stage 2, mildly decreased GFR    GFR of approximately 60   Depression    Diverticulosis of colon (without mention of hemorrhage) 2012   Dr. Laural Golden   Fasting hyperglycemia    120 fasting   Gastroesophageal reflux disease    Gastroparesis    Hemorrhoids    Hyperlipidemia    Hypertension    pt denies 05/30/13     Dr Kathe Mariner   Hyponatremia    Rheumatic heart disease    a. 07/2013 Echo: Ef 55-60%, Mod AS/AI, Mod-Sev MS, sev dil LA, PASP 58;  b. 07/2013 TEE: EF 35-40%, mild-mod AS/AI, mod-sev MS, LA smoke;  c. 07/2013 Cath: elev R heart pressures, nl cors.   Shortness of breath     Past Medical History:    Hx of NEOPLASM, MALIGNANT, BREAST (ICD-174.9)    HYPERLIPIDEMIA (ICD-272.4)    ASTHMA (ICD-493.90)    GERD with gastroparesis   Past Surgical History:    Breast surgery 1995    Heart valve surgery 1962 age 89 [facility] Mitral valve     Tubal ligation-1973   Objective:     07/22/2021      162  10/29/2020         177 09/16/2020      178   08/05/20 178 lb 12.8 oz (81.1 kg)  06/25/20 194 lb (88 kg)  06/23/20 180 lb (81.6 kg)    Vital signs reviewed  07/22/2021  - Note at rest 02 sats  95% on RA   General appearance:    somber amb wf nad with freq throat clearing   HEENT : pt wearing mask not removed for exam due to covid -19 concerns.    NECK :  without JVD/Nodes/TM/ nl carotid upstrokes bilaterally   LUNGS: no acc muscle use,  Nl contour chest which is clear to A and P bilaterally without cough on insp or exp maneuvers   CV:  RRR  no s3 or murmur or increase in P2, and no edema   ABD:  soft and nontender with nl inspiratory excursion in the supine position. No bruits or organomegaly appreciated, bowel sounds nl  MS:  Nl gait/ ext warm without deformities, calf tenderness, cyanosis or clubbing No obvious joint restrictions   SKIN: warm and dry without lesions    NEURO:  alert, approp, nl sensorium with  no motor or cerebellar deficits apparent.           I personally reviewed images and agree with radiology impression as follows:   Chest CTa  06/01/21  1. No evidence of pulmonary embolism or acute cardiopulmonary disease. 2. Aortic atherosclerosis      Assessment

## 2021-07-23 ENCOUNTER — Encounter: Payer: Self-pay | Admitting: Internal Medicine

## 2021-07-23 LAB — IGE: IgE (Immunoglobulin E), Serum: 15 kU/L (ref <=114)

## 2021-07-23 NOTE — Assessment & Plan Note (Signed)
Original onset 2010 on ACEi  Spiro 09/2012: normal  Sinus ct 2014:  No chronic sinus disease. 10/16/12 Meth challenge  + for hyperreactive airways disease 04/30/20 CT head sinus nl - 06/23/2020  After extensive coaching inhaler device,  effectiveness =    75% from a baseline of 25% > continue flovent 44 2bid and prn saba plus max gerd rx  08/05/2020    changed to symb 80 2bid and given depomedrol 120 mg IM  > no better - 09/16/2020  After extensive coaching inhaler device,  effectiveness =    50% baseline of less than 25% - 09/16/2020 started gabapentin titrate up to 100 mg qid  -  11/26/20  Sinus CT neg  - Allergy profile 07/22/2021 >  Eos 0.2 /  IgE  Pending   Suspect this cough is multifactorial but the fact she still feels she needs saba bid may indicate poor asthma control in pt with component of UACS which can be a challenging problem since asthma meds may aggravate UACS  rec Try change to symbicort 80 2bid/ teaching done Try gabapentin 100  Mg qid (did not mean for her to stop it until re -eval in office and explained why it's difficult to assess response to rx if not following instructions or rx )   F/u in 6 weeks in the [place] office         Each maintenance medication was reviewed in detail including emphasizing most importantly the difference between maintenance and prns and under what circumstances the prns are to be triggered using an action plan format where appropriate.  Total time for H and P, chart review, counseling, reviewing hfa device(s) and generating customized AVS unique to this office visit / same day charting > 30 min

## 2021-08-04 ENCOUNTER — Ambulatory Visit (INDEPENDENT_AMBULATORY_CARE_PROVIDER_SITE_OTHER): Payer: Medicare PPO | Admitting: *Deleted

## 2021-08-04 DIAGNOSIS — I4891 Unspecified atrial fibrillation: Secondary | ICD-10-CM | POA: Diagnosis not present

## 2021-08-04 DIAGNOSIS — Z5181 Encounter for therapeutic drug level monitoring: Secondary | ICD-10-CM | POA: Diagnosis not present

## 2021-08-04 DIAGNOSIS — I639 Cerebral infarction, unspecified: Secondary | ICD-10-CM | POA: Diagnosis not present

## 2021-08-04 LAB — POCT INR: INR: 2.2 (ref 2.0–3.0)

## 2021-08-04 NOTE — Patient Instructions (Signed)
Take warfarin 2 tablets tonight then resume 1 tablet daily except 1 1/2 tablets on Thursday.  Recheck INR in 3 weeks

## 2021-08-07 ENCOUNTER — Other Ambulatory Visit: Payer: Self-pay | Admitting: Internal Medicine

## 2021-08-07 DIAGNOSIS — R0609 Other forms of dyspnea: Secondary | ICD-10-CM

## 2021-08-13 ENCOUNTER — Telehealth: Payer: Self-pay | Admitting: Cardiology

## 2021-08-13 MED ORDER — METOPROLOL TARTRATE 25 MG PO TABS: 37.5000 mg | ORAL_TABLET | Freq: Two times a day (BID) | ORAL | 3 refills | Status: DC

## 2021-08-13 NOTE — Telephone Encounter (Signed)
Pt's husband notified that Lopressor 37.5 mg two times daily was called into pharmacy.

## 2021-08-13 NOTE — Telephone Encounter (Signed)
Pt c/o medication issue:  1. Name of Medication: metoprolol tartrate (LOPRESSOR) 25 MG tablet  2. How are you currently taking this medication (dosage and times per day)? 1 and a half tablets twice a day   3. Are you having a reaction (difficulty breathing--STAT)? no  4. What is your medication issue? Patient's husband states the prescription was increased but the pharmacy still has the old prescription and she is running out of the prescription. He would like the new prescription sent to the pharmacy.

## 2021-08-13 NOTE — Telephone Encounter (Signed)
Spoke with husband who states that Lopressor was increased to 37.5 mg BID at last ED visit by Dr. Sherral Hammers. Husband states that this change was cleared with Dr. Harl Bowie at last office visit. Please advise.

## 2021-08-13 NOTE — Telephone Encounter (Signed)
Yes, can update dose and prescription to 37.5mg  bid   Zandra Abts MD

## 2021-08-25 ENCOUNTER — Ambulatory Visit (INDEPENDENT_AMBULATORY_CARE_PROVIDER_SITE_OTHER): Payer: Medicare PPO | Admitting: *Deleted

## 2021-08-25 DIAGNOSIS — I639 Cerebral infarction, unspecified: Secondary | ICD-10-CM | POA: Diagnosis not present

## 2021-08-25 DIAGNOSIS — Z5181 Encounter for therapeutic drug level monitoring: Secondary | ICD-10-CM

## 2021-08-25 DIAGNOSIS — I4891 Unspecified atrial fibrillation: Secondary | ICD-10-CM | POA: Diagnosis not present

## 2021-08-25 LAB — POCT INR: INR: 1.8 — AB (ref 2.0–3.0)

## 2021-08-25 NOTE — Patient Instructions (Signed)
Description   Take 1.5 tablets today of warfarin, then START taking warfarin 1 tablet daily except for 1.5 tablets on Sundays and Thursdays. Recheck INR in 2 weeks.

## 2021-08-30 ENCOUNTER — Other Ambulatory Visit: Payer: Self-pay | Admitting: Cardiology

## 2021-08-30 DIAGNOSIS — H5213 Myopia, bilateral: Secondary | ICD-10-CM | POA: Diagnosis not present

## 2021-08-30 DIAGNOSIS — H25813 Combined forms of age-related cataract, bilateral: Secondary | ICD-10-CM | POA: Diagnosis not present

## 2021-09-08 ENCOUNTER — Ambulatory Visit (INDEPENDENT_AMBULATORY_CARE_PROVIDER_SITE_OTHER): Payer: Medicare PPO | Admitting: *Deleted

## 2021-09-08 DIAGNOSIS — Z5181 Encounter for therapeutic drug level monitoring: Secondary | ICD-10-CM

## 2021-09-08 DIAGNOSIS — Z952 Presence of prosthetic heart valve: Secondary | ICD-10-CM

## 2021-09-08 DIAGNOSIS — I639 Cerebral infarction, unspecified: Secondary | ICD-10-CM | POA: Diagnosis not present

## 2021-09-08 DIAGNOSIS — I4891 Unspecified atrial fibrillation: Secondary | ICD-10-CM | POA: Diagnosis not present

## 2021-09-08 LAB — POCT INR: INR: 2.2 (ref 2.0–3.0)

## 2021-09-08 NOTE — Patient Instructions (Signed)
Take 1.5 tablets today of warfarin, then START taking warfarin 1 tablet daily except for 1.5 tablets on Sundays, Tuesdays and Thursdays. Recheck INR in 3 weeks.

## 2021-09-14 ENCOUNTER — Other Ambulatory Visit: Payer: Self-pay

## 2021-09-14 ENCOUNTER — Ambulatory Visit: Payer: Medicare PPO | Admitting: Internal Medicine

## 2021-09-14 ENCOUNTER — Encounter: Payer: Self-pay | Admitting: Internal Medicine

## 2021-09-14 DIAGNOSIS — R058 Other specified cough: Secondary | ICD-10-CM | POA: Diagnosis not present

## 2021-09-14 MED ORDER — PREDNISONE 10 MG PO TABS: ORAL_TABLET | ORAL | 0 refills | Status: DC

## 2021-09-14 MED ORDER — BREZTRI AEROSPHERE 160-9-4.8 MCG/ACT IN AERO: 2.0000 | INHALATION_SPRAY | Freq: Two times a day (BID) | RESPIRATORY_TRACT | 0 refills | Status: DC

## 2021-09-14 MED ORDER — BUDESONIDE 0.25 MG/2ML IN SUSP: RESPIRATORY_TRACT | 11 refills | Status: DC

## 2021-09-14 NOTE — Progress Notes (Signed)
[name], female    DOB: [birth year]      MRN: [id number]   Brief patient profile:  75  yowf never smoker with a h/o sob related to valvular heart dz    Seen prior to Epic in [place]: Feb 20, 2009 initial pulmonary eval started aerobics 3 months prior to OV  X 30 min weekly and tolerated well with improving ex tol until 3 weeks prior to OV   noted decrease tolerance and since then gradually worse to point where walking to mailbox slt uphill or climbing steps has to stop at top.  In retrospect coughing for 3 months dry, worse immediately on lying down. due to the cough lisnopril was stopped and could not tolerate the substitute and cough hasn't changed but breathing got worse. prednisone no help so far and proaire no benefit. rec GERD diet   continue prilosec and reglan stop prednisone and proaire  avoid salt  maxzide 25 one each am and Dr Izell Falcon office   10/16/12 Meth challenge  + for hyperreactive airways disease  clance eval 05/14/13 [name] that at this point is clearly upper airway in origin.  [name], but her current cough failed to respond to prednisone tapers.  Therefore it is unlikely that it is related to cough variant asthma.  She does not have chronic sinusitis by her CT scan, and there is nothing on chest x-ray earlier in the year to explain her cough.  She does have a history of breast cancer, but there has been no evidence for recurrence.  [name], but this has an extremely low yield.  [name], and therefore I am concerned she may be having laryngopharyngeal reflux as a cause for her cough.  I also think she may have a cyclical mechanism as well she did have some response to tramadol, and we could consider a trial of gabapentin  AVR  07/18/13   04/2020 echo  1. Left ventricular ejection fraction, by estimation, is 60 to 65%. The   left ventricle has normal function. The left ventricle has no regional  wall motion abnormalities. Left ventricular diastolic parameters are  indeterminate.   2. Right ventricular systolic function is low normal. The right  ventricular size is normal. There is moderately elevated pulmonary artery  systolic pressure. The estimated right ventricular systolic pressure is  42.3 mmHg.   3. Left atrial size was mildly dilated.   4. The mitral valve has been repaired/replaced. No evidence of mitral  valve regurgitation. The mean mitral valve gradient is 5.0 mmHg. There is  a 25 mm Magna Ease bioprosthetic valve present in the mitral position.  Grossly normal function.   5. The aortic valve has been repaired/replaced. Aortic valve  regurgitation is not visualized. There is a 21 mm Magna Ease bioprosthetic  valve present in the aortic position. Aortic valve mean gradient measures  7.0 mmHg. Grossly normal function.   6. The inferior vena cava is normal in size with greater than 50%  respiratory variability, suggesting right atrial pressure of 3 mmHg.        History of Present Illness  06/23/2020  Pulmonary/ 1st office eval/ Orestes Geiman / [employer] [facility] / re establish re cough and sob on flovent 44 2bid  For cough variant asthma  Chief Complaint  Patient presents with   Consult    Patient has  a dry cough that has been going on for many years but worse over the last 6 months. Shortness of breath with exertion.   Dyspnea:  Walks dog x 30 min / mostly flat uses cane to balance/ steps ok slowly due to breathing problems x sev years prior to OV   Cough: worse in afternoons and sometimes hs has to sit up 30 degrees to prevent cough supine  Sleep: ok as long as head is up 30 degrees new x 2 years  SABA use: sev times a day seems to help at 4 pm rec GERD diet  Pantoprazole (protonix) 40 mg   Take  30-60 min before first meal of the day and Pepcid (famotidine)  20 mg one after supper until return to  office - this is the best way to tell whether stomach acid is contributing to your problem.   Plan A = Automatic = Always=    flovent Take 2 puffs first thing in am and then another 2 puffs about 12 hours later.  Work on inhaler technique:  Plan B = Backup (to supplement plan A, not to replace it) Only use your albuterol inhaler as a rescue medication   Please schedule a follow up office visit in 6 weeks, call sooner if needed - bring inhalers  Next step ? Gabapentin?   08/05/2020  f/u ov/[place] office/Arly Salminen re: cough > sob x years  Chief Complaint  Patient presents with   Follow-up    non productive cough   Dyspnea:  Not limited by breathing from desired activities  / plays golf/ walks dog  Cough: dry ? Worse with activity (husband says not reproducible ?  worse p supper  Sleeping: most nights waking up  SABA use: once a day  02: none  rec Depomedrol 120 mg IM  Plan A = Automatic = Always=    symbicort 80 Take 2 puffs first thing in am and then another 2 puffs about 12 hours later.  Work on inhaler technique:     Plan B = Backup (to supplement plan A, not to replace it) Only use your albuterol inhaler as a rescue medication   Please schedule a follow up office visit in 6 weeks, call sooner if needed  - bring inhalers and the empty cannister of symbicort    09/16/2020  f/u ov/[place] office/Elica Almas re: cough  x years ? Decades "nothing has ever helped" no better p depo last ov, not ever transiently  Chief Complaint  Patient presents with   Follow-up    Cough is unchanged since the last visit- prod with white sputum.  She is using her albuterol inhaler once per day usually around 4-5 pm.   Dyspnea:  Walks dog, uses cane / some balance issues limiting more than sob and still plays golf Cough: worse  p supper despite pepcid Sleeping: does better in recliner 30 degrees  SABA use: once a day around 5pm seems to help though note hfa very poor baseline  02: none Work on inhaler  technique:   Gabapentin 100 mg twice a day for a week then 4 x daily  Please remember to go to the  x-ray department  @  [facility] for your tests - we will call you with the results when they are available    Add Needs f/u cxr  ? rml syndrome p rx mucinex dm > then ct chest/ sinus if not better  and check allergy profile on return  10/29/2020  f/u ov/[place] office/Mohamadou Maciver re: chronic cough  Chief Complaint  Patient presents with   Followup     Cough is unchanged since the last visit. She has used her albuterol inhaler twice since her last visit.   Dyspnea:  Slowed more balance than breathing, walks with suction cup cane Cough: dry cough day > noct Sleeping: able to sleep sitting up 30 degrees s sob or cough  SABA use: rare 02: none  Rec We will call to schedule you CT sinus (neg) and chest = ? Scarring from Breast RT (note surgery/RT in early 90's and cough started p that      07/22/2021  f/u ov/[facility] re: cough > sob    maint on flovent  for dx of MCT pos cough variant asthma / stopped gabapentin ? Why ? Chief Complaint  Patient presents with   Follow-up    Productive cough with thick white mucus   Dyspnea:  mailbox and back is 200 yards slow pace Cough: daytime cough / lots of throat clearing  Sleeping: 30 degrees ok  SABA use: using saba bid/ poor flovent use  02: none  Covid status:   vax x 5  Rec Pantoprazole (protonix) 40 mg   Take  30-60 min before first meal of the day and Pepcid (famotidine)  20 mg after supper until return to office   Gabapentin 100 mg four times daily  Plan A = Automatic = Always=   Symbicort 80 mg  Take 2 puffs first thing in am and then another 2 puffs about 12 hours later. (Stop flovent and atrovent- unless can't afford symbicort on your plan) Plan B = Backup (to supplement plan A, not to replace it) Only use your albuterol inhaler as a rescue medication   Plan C = Crisis (instead of Plan B but only if Plan B stops working) - only  use your albuterol nebulizer if you first try Plan B   For cough > mucinex dm 1200 every 12 hours as needed    09/14/2021  f/u ov/[place] office/Lynnell Fiumara re: uacs vs asthma maint on symb  80 2bid  Chief Complaint  Patient presents with   Follow-up    Cough with thick white mucus is "about the same" since last OV.   Dyspnea:  walking with dog and mailbox with cane multiprong Cough: daytime, congested but just min white mucus despite mucinex dm 1200 bid and gerd rx  Sleeping: fine at 30 degrees s awakening due to cough or any other resp problem SABA use: sometime helps, pred helpss ore  02: none      No obvious day to day or daytime variability or assoc excess/ purulent sputum or mucus plugs or hemoptysis or cp or chest tightness, subjective wheeze or overt sinus or hb symptoms.     Also denies any obvious fluctuation of symptoms with weather or environmental changes or other aggravating or alleviating factors except as outlined above   No unusual exposure hx or h/o childhood pna/ asthma or knowledge of premature birth.  Current Allergies, Complete Past Medical History, Past Surgical History, Family History, and Social History were reviewed in [employer] record.  ROS  The following are not active complaints unless bolded Hoarseness, sore throat, dysphagia, dental problems, itching, sneezing,  nasal congestion or discharge of excess mucus or purulent secretions, ear ache,   fever, chills, sweats, unintended wt loss or wt gain, classically pleuritic or exertional cp,  orthopnea pnd or arm/hand swelling  or  leg swelling, presyncope, palpitations, abdominal pain, anorexia, nausea, vomiting, diarrhea  or change in bowel habits or change in bladder habits, change in stools or change in urine, dysuria, hematuria,  rash, arthralgias, visual complaints, headache, numbness, weakness or ataxia or problems with walking/uses multipronged cane or coordination,  change in mood or   memory.        Current Meds  Medication Sig   albuterol (PROVENTIL) (2.5 MG/3ML) 0.083% nebulizer solution Take 3 mLs (2.5 mg total) by nebulization every 4 (four) hours as needed for wheezing or shortness of breath.   atorvastatin (LIPITOR) 40 MG tablet TAKE ONE TABLET BY MOUTH ONCE DAILY.   cetirizine (ZYRTEC) 10 MG tablet Take 10 mg by mouth daily.   dextromethorphan-guaiFENesin (MUCINEX DM) 30-600 MG 12hr tablet Take 1 tablet by mouth 2 (two) times daily.   diltiazem (CARDIZEM CD) 180 MG 24 hr capsule Take 1 capsule (180 mg total) by mouth daily.   escitalopram (LEXAPRO) 10 MG tablet Take 10 mg by mouth daily.    famotidine (PEPCID) 20 MG tablet TAKE 1 TABLET AFTER SUPPER   fluticasone (FLONASE) 50 MCG/ACT nasal spray Place 1 spray into both nostrils daily as needed for allergies.    furosemide (LASIX) 20 MG tablet TAKE (1) TABLET BY MOUTH ONCE DAILY FOR SWELLING.   gabapentin (NEURONTIN) 100 MG capsule Take 1 capsule (100 mg total) by mouth 4 (four) times daily. One three times daily   levothyroxine (SYNTHROID) 100 MCG tablet Take 100 mcg by mouth daily before breakfast.   metFORMIN (GLUCOPHAGE) 500 MG tablet Take 1 tablet (500 mg total) by mouth 2 (two) times daily with a meal.   metoprolol tartrate (LOPRESSOR) 25 MG tablet Take 1.5 tablets (37.5 mg total) by mouth 2 (two) times daily.   Multiple Vitamin (MULITIVITAMIN WITH MINERALS) TABS Take 1 tablet by mouth daily.   olmesartan (BENICAR) 5 MG tablet Take 5 mg by mouth daily.   ondansetron (ZOFRAN) 4 MG tablet Take 1 tablet (4 mg total) by mouth every 6 (six) hours as needed for nausea.   pantoprazole (PROTONIX) 40 MG tablet Take 1 tablet (40 mg total) by mouth daily. Take 30-60 min before first meal of the day   potassium chloride SA (K-DUR,KLOR-CON) 20 MEQ tablet Take 20 mEq by mouth daily.   PREVIDENT 5000 BOOSTER PLUS 1.1 % PSTE Place onto teeth.   warfarin (COUMADIN) 2.5 MG tablet TAKE AS DIRECTED BY COUMADIN CLINIC.    [DISCONTINUED] albuterol (VENTOLIN HFA) 108 (90 Base) MCG/ACT inhaler Inhale 2 puffs into the lungs every 4 (four) hours as needed for wheezing or shortness of breath.    [DISCONTINUED] budesonide-formoterol (SYMBICORT) 80-4.5 MCG/ACT inhaler Take 2 puffs first thing in am and then another 2 puffs about 12 hours later.           Past Medical History:  Diagnosis Date   Anemia    Atrial fibrillation ([place])    Breast carcinoma ([place]) 1995   1610   Cardioembolic stroke ([place]) 06/6044   Right frontal in 01/2012; normal carotid ultrasound; possible LAA thrombus by TEE; virtual complete neurologic recovery   Chronic kidney disease, stage 2, mildly decreased GFR    GFR of approximately 60   Depression    Diverticulosis of colon (without mention of hemorrhage) 2012   Dr. Laural Golden   Fasting hyperglycemia    120 fasting   Gastroesophageal reflux disease    Gastroparesis    Hemorrhoids    Hyperlipidemia    Hypertension  pt denies 05/30/13     Dr Kathe Mariner   Hyponatremia    Rheumatic heart disease    a. 07/2013 Echo: Ef 55-60%, Mod AS/AI, Mod-Sev MS, sev dil LA, PASP 58;  b. 07/2013 TEE: EF 35-40%, mild-mod AS/AI, mod-sev MS, LA smoke;  c. 07/2013 Cath: elev R heart pressures, nl cors.   Shortness of breath     Past Medical History:    Hx of NEOPLASM, MALIGNANT, BREAST (ICD-174.9)    HYPERLIPIDEMIA (ICD-272.4)    ASTHMA (ICD-493.90)    GERD with gastroparesis   Past Surgical History:    Breast surgery 1995    Heart valve surgery 1962 age 34 [facility] Mitral valve     Tubal ligation-1973   Objective:    09/14/2021      170  07/22/2021      162  10/29/2020        177 09/16/2020      178   08/05/20 178 lb 12.8 oz (81.1 kg)  06/25/20 194 lb (88 kg)  06/23/20 180 lb (81.6 kg)     Vital signs reviewed  09/14/2021  - Note at rest 02 sats  98% on RA   General appearance:    somber wf easily confused with details of care    HEENT : pt wearing mask not removed for exam due to  covid -19 concerns.    NECK :  without JVD/Nodes/TM/ nl carotid upstrokes bilaterally   LUNGS: no acc muscle use,  Nl contour chest with insp/exp rhonchi bilaterally without cough on insp or exp maneuvers   CV:  RRR  no s3 or murmur or increase in P2, and no edema   ABD:  soft and nontender with nl inspiratory excursion in the supine position. No bruits or organomegaly appreciated, bowel sounds nl  MS: walks slowly with cane/multipronged / ext warm without deformities, calf tenderness, cyanosis or clubbing No obvious joint restrictions   SKIN: warm and dry without lesions    NEURO:  alert, approp, nl sensorium with  no motor or cerebellar deficits apparent.        Assessment

## 2021-09-14 NOTE — Assessment & Plan Note (Signed)
Original onset 2010 on ACEi  Spiro 09/2012: normal  Sinus ct 2014:  No chronic sinus disease. 10/16/12 Meth challenge  + for hyperreactive airways disease 04/30/20 CT head sinus nl - 06/23/2020  After extensive coaching inhaler device,  effectiveness =    75% from a baseline of 25% > continue flovent 44 2bid and prn saba plus max gerd rx  08/05/2020    changed to symb 80 2bid and given depomedrol 120 mg IM  > no better - 09/16/2020  After extensive coaching inhaler device,  effectiveness =    50% baseline of less than 25% - 09/16/2020 started gabapentin titrate up to 100 mg qid  -  11/26/20  Sinus CT neg  - Allergy profile 07/22/2021 >  Eos 0.2 /  IgE  15 - 09/14/2021  After extensive coaching inhaler device,  effectiveness =    0% so changed to alb/bud bid   Even partial response to pred/ alb is better than what I previously understood and since can't use hfa best option is first to try bud/alb and if that fails bud/Laba if can afford it thru [place].         Each maintenance medication was reviewed in detail including emphasizing most importantly the difference between maintenance and prns and under what circumstances the prns are to be triggered using an action plan format where appropriate.  Total time for H and P, chart review, counseling, reviewing hfa/ neb device(s) and generating customized AVS unique to this office visit / same day charting  > 30 min

## 2021-09-14 NOTE — Patient Instructions (Signed)
Stop all inhalers since not able to use them correctly   Plan A = Automatic = Always=   Albuterol 2.5 mg with budesonide 0.25 every 12hours   Plan B = Backup (to supplement plan A, not to replace it) Only use your albuterol nebulizer  as a rescue medication to be used if you can't catch your breath by resting or doing a relaxed purse lip breathing pattern.  - The less you use it, the better it will work when you need it. - Ok to use the inhaler up to every 4 hours if you must but call for appointment if use goes up over your usual need    Prednisone 10 mg take  4 each am x 2 days,   2 each am x 2 days,  1 each am x 2 days and stop   Please schedule a follow up visit in 3 months but call sooner if needed

## 2021-09-20 ENCOUNTER — Other Ambulatory Visit: Payer: Self-pay | Admitting: Internal Medicine

## 2021-09-20 DIAGNOSIS — R0609 Other forms of dyspnea: Secondary | ICD-10-CM

## 2021-09-29 ENCOUNTER — Other Ambulatory Visit: Payer: Self-pay

## 2021-09-29 ENCOUNTER — Ambulatory Visit (INDEPENDENT_AMBULATORY_CARE_PROVIDER_SITE_OTHER): Payer: Medicare PPO | Admitting: *Deleted

## 2021-09-29 DIAGNOSIS — I639 Cerebral infarction, unspecified: Secondary | ICD-10-CM | POA: Diagnosis not present

## 2021-09-29 DIAGNOSIS — Z5181 Encounter for therapeutic drug level monitoring: Secondary | ICD-10-CM | POA: Diagnosis not present

## 2021-09-29 DIAGNOSIS — I4891 Unspecified atrial fibrillation: Secondary | ICD-10-CM | POA: Diagnosis not present

## 2021-09-29 LAB — POCT INR: INR: 2.8 (ref 2.0–3.0)

## 2021-09-29 NOTE — Patient Instructions (Signed)
Continue warfarin 1 tablet daily except for 1.5 tablets on Sundays, Tuesdays and Thursdays. Recheck INR in 4 weeks.

## 2021-10-05 DIAGNOSIS — I5032 Chronic diastolic (congestive) heart failure: Secondary | ICD-10-CM | POA: Diagnosis not present

## 2021-10-05 DIAGNOSIS — E1129 Type 2 diabetes mellitus with other diabetic kidney complication: Secondary | ICD-10-CM | POA: Diagnosis not present

## 2021-10-05 DIAGNOSIS — N1831 Chronic kidney disease, stage 3a: Secondary | ICD-10-CM | POA: Diagnosis not present

## 2021-10-05 DIAGNOSIS — Z79899 Other long term (current) drug therapy: Secondary | ICD-10-CM | POA: Diagnosis not present

## 2021-10-12 DIAGNOSIS — E1122 Type 2 diabetes mellitus with diabetic chronic kidney disease: Secondary | ICD-10-CM | POA: Diagnosis not present

## 2021-10-12 DIAGNOSIS — N1831 Chronic kidney disease, stage 3a: Secondary | ICD-10-CM | POA: Diagnosis not present

## 2021-10-12 DIAGNOSIS — I5032 Chronic diastolic (congestive) heart failure: Secondary | ICD-10-CM | POA: Diagnosis not present

## 2021-10-12 DIAGNOSIS — R7309 Other abnormal glucose: Secondary | ICD-10-CM | POA: Diagnosis not present

## 2021-10-18 ENCOUNTER — Other Ambulatory Visit: Payer: Self-pay | Admitting: Cardiology

## 2021-10-26 ENCOUNTER — Ambulatory Visit
Admission: EM | Admit: 2021-10-26 | Discharge: 2021-10-26 | Disposition: A | Discharge status: Home / Self Care | Payer: Medicare PPO | Attending: Family Medicine | Admitting: Family Medicine

## 2021-10-26 ENCOUNTER — Other Ambulatory Visit: Payer: Self-pay

## 2021-10-26 DIAGNOSIS — M25512 Pain in left shoulder: Secondary | ICD-10-CM | POA: Diagnosis not present

## 2021-10-26 MED ORDER — CYCLOBENZAPRINE HCL 5 MG PO TABS: 2.5000 mg | ORAL_TABLET | Freq: Three times a day (TID) | ORAL | 0 refills | Status: DC | PRN

## 2021-10-26 MED ORDER — DICLOFENAC SODIUM 1 % EX GEL: 2.0000 g | Freq: Four times a day (QID) | CUTANEOUS | 0 refills | Status: DC

## 2021-10-26 NOTE — Discharge Instructions (Signed)
Follow-up with your primary care provider for a recheck within the next week.  Go to the emergency department if your symptoms significantly worsen at any time.

## 2021-10-26 NOTE — ED Provider Notes (Signed)
[facility]    CSN: [id number] Arrival date & time: 10/26/21  4536      History   Chief Complaint Chief Complaint  Patient presents with   Shoulder Pain    Left shoulder pain    HPI [name] is a 76 y.o. female.   Presenting today with 3-day history of constant dull aching left shoulder pain that is now extending down halfway down upper arm on the left side.  States the pain is constant, sometimes made worse with taking deep breaths, sometimes with movement.  No known injury, recent heavy lifting or new exercises.  Denies radiation of pain down the arm, numbness, tingling, weakness, chest pain, shortness of breath, dizziness, nausea, vomiting.  Has tried some Tylenol with minimal relief of pain.  Complicated past medical history to include cardioembolic stroke, valve replacement on Coumadin, asthma, history of breast cancer, diabetes, hypertension.  She also notes that she has had bursitis in this shoulder before and has felt somewhat similar but did not extend pain partially down her arm.   Past Medical History:  Diagnosis Date   Anemia    Asthma    Atrial fibrillation ([place])    Breast carcinoma ([place]) 1995   4680   Cardioembolic stroke ([place]) 12/2120   Right frontal in 01/2012; normal carotid ultrasound; possible LAA thrombus by TEE; virtual complete neurologic recovery   Chronic kidney disease, stage 2, mildly decreased GFR    GFR of approximately 60   Depression    Diabetes mellitus without complication ([place])    Diverticulosis of colon (without mention of hemorrhage) 2012   Dr. Laural Golden   Fasting hyperglycemia    120 fasting   Gastroesophageal reflux disease    Gastroparesis    Hemorrhoids    Hyperlipidemia    Hypertension    pt denies 05/30/13     Dr Kathe Mariner   Hyponatremia    Rheumatic heart disease    a. 07/2013 Echo: Ef 55-60%, Mod AS/AI, Mod-Sev MS, sev dil LA, PASP 58;  b. 07/2013 TEE: EF 35-40%, mild-mod AS/AI, mod-sev MS, LA smoke;  c.  07/2013 Cath: elev R heart pressures, nl cors.   Shortness of breath     Patient Active Problem List   Diagnosis Date Noted   RSV infection 06/03/2021   COPD with acute exacerbation ([place]) 06/01/2021   Type 2 diabetes mellitus treated without insulin ([place]) 06/01/2021   COPD exacerbation ([place]) 06/01/2021   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   Asthma exacerbation 11/12/2016   Synovial cyst of lumbar spine 05/13/2014   Encounter for therapeutic drug monitoring 11/06/2013   Hyponatremia    Atrial fibrillation (HCC)    Moderate aortic stenosis 07/12/2013   Moderate aortic insufficiency 07/12/2013   Status post aortic valve and mitral valve replacement 07/12/2013   Severe mitral valve stenosis 07/09/2013   Mitral stenosis 07/08/2013   Chest pain 07/07/2013   Asthma, cough variant 03/27/2013   Upper airway cough syndrome in pt with mild cough variant asthma  09/17/2012   Dyspnea on exertion 06/13/2012   Edema 06/13/2012   Rheumatic heart disease 06/13/2012   Fasting hyperglycemia    Chronic anticoagulation 01/26/2012   Hypothyroidism 48/25/0037   Cardioembolic stroke ([place]) 04/88/8916   Hyperlipidemia    Essential hypertension    Breast carcinoma (HCC)    Gastroesophageal reflux disease     Past Surgical History:  Procedure Laterality Date   AORTIC VALVE REPLACEMENT N/A 07/18/2013   Procedure: AORTIC VALVE REPLACEMENT (  AVR);  Surgeon: Gaye Pollack, MD;  Location: [facility] OR;  Service: Open Heart Surgery;  Laterality: N/A;   BACK SURGERY     BREAST LUMPECTOMY Right 1995   CARDIAC CATHETERIZATION     CARDIOVERSION N/A 07/26/2013   Procedure: CARDIOVERSION;  Surgeon: Fay Records, MD;  Location: [place];  Service: Cardiovascular;  Laterality: N/A;   COLONOSCOPY  2012   Negative screening procedure   DILATION AND CURETTAGE OF UTERUS     ESOPHAGEAL MANOMETRY N/A 06/17/2013   Procedure: ESOPHAGEAL MANOMETRY (EM);  Surgeon: Sable Feil, MD;  Location: [facility] ENDOSCOPY;   Service: Endoscopy;  Laterality: N/A;   INTRAOPERATIVE TRANSESOPHAGEAL ECHOCARDIOGRAM N/A 07/18/2013   Procedure: INTRAOPERATIVE TRANSESOPHAGEAL ECHOCARDIOGRAM;  Surgeon: Gaye Pollack, MD;  Location: [place] OR;  Service: Open Heart Surgery;  Laterality: N/A;   LEFT AND RIGHT HEART CATHETERIZATION WITH CORONARY ANGIOGRAM N/A 07/10/2013   Procedure: LEFT AND RIGHT HEART CATHETERIZATION WITH CORONARY ANGIOGRAM;  Surgeon: Burnell Blanks, MD;  Location: [facility] CATH LAB;  Service: Cardiovascular;  Laterality: N/A;   LUMBAR LAMINECTOMY/DECOMPRESSION MICRODISCECTOMY Right 05/13/2014   Procedure: LUMBAR LAMINECTOMY/DECOMPRESSION MICRODISCECTOMY 1 LEVEL  lumbar four/five;  Surgeon: Faythe Ghee, MD;  Location: [facility] NEURO ORS;  Service: Neurosurgery;  Laterality: Right;   MITRAL VALVE REPLACEMENT N/A 07/18/2013   Procedure: MITRAL VALVE (MV) REPLACEMENT;  Surgeon: Gaye Pollack, MD;  Location: [place] OR;  Service: Open Heart Surgery;  Laterality: N/A;   MITRAL VALVE [place], closed mitral valvulotomy by finger fracture   TEE WITHOUT CARDIOVERSION  01/24/2012   Procedure: TRANSESOPHAGEAL ECHOCARDIOGRAM (TEE);  Surgeon: Lelon Perla, MD;  Location: [facility] ENDOSCOPY;  Service: Cardiovascular;  Laterality: N/A;   TEE WITHOUT CARDIOVERSION N/A 07/11/2013   Procedure: TRANSESOPHAGEAL ECHOCARDIOGRAM (TEE);  Surgeon: Thayer Headings, MD;  Location: [place];  Service: Cardiovascular;  Laterality: N/A;   TEE WITHOUT CARDIOVERSION N/A 07/26/2013   Procedure: TRANSESOPHAGEAL ECHOCARDIOGRAM (TEE);  Surgeon: Fay Records, MD;  Location[facility] ENDOSCOPY;  Service: Cardiovascular;  Laterality: N/A;   TUBAL LIGATION  1973    OB History   No obstetric history on file.      Home Medications    Prior to Admission medications   Medication Sig Start Date End Date Taking? Authorizing Provider  cyclobenzaprine (FLEXERIL) 5 MG tablet Take 0.5 tablets (2.5 mg total) by mouth 3 (three) times daily as needed  for muscle spasms. Do not drink alcohol or drive while taking this medication.  May cause drowsiness. 10/26/21  Yes Volney American, PA-C  diclofenac Sodium (VOLTAREN) 1 % GEL Apply 2 g topically 4 (four) times daily. 10/26/21  Yes Volney American, PA-C  albuterol (PROVENTIL) (2.5 MG/3ML) 0.083% nebulizer solution Take 3 mLs (2.5 mg total) by nebulization every 4 (four) hours as needed for wheezing or shortness of breath. 07/22/21   Tanda Rockers, MD  atorvastatin (LIPITOR) 40 MG tablet TAKE ONE TABLET BY MOUTH ONCE DAILY. 10/18/21   Arnoldo Lenis, MD  budesonide (PULMICORT) 0.25 MG/2ML nebulizer solution One 0.25 mg  twice daily  along with albuterol 09/14/21   Tanda Rockers, MD  cetirizine (ZYRTEC) 10 MG tablet Take 10 mg by mouth daily.    [provider]  dextromethorphan-guaiFENesin (MUCINEX DM) 30-600 MG 12hr tablet Take 1 tablet by mouth 2 (two) times daily.    [provider]  diltiazem (CARDIZEM CD) 180 MG 24 hr capsule Take 1 capsule (180 mg total) by mouth daily. 08/03/13  Collins, Gina L, PA-C  escitalopram (LEXAPRO) 10 MG tablet Take 10 mg by mouth daily.  04/10/15   [provider]  famotidine (PEPCID) 20 MG tablet TAKE 1 TABLET AFTER SUPPER 09/20/21   Tanda Rockers, MD  fluticasone [facility]) 50 MCG/ACT nasal spray Place 1 spray into both nostrils daily as needed for allergies.  09/05/13   Arnoldo Lenis, MD  furosemide (LASIX) 20 MG tablet TAKE (1) TABLET BY MOUTH ONCE DAILY FOR SWELLING. 08/30/21   Arnoldo Lenis, MD  gabapentin (NEURONTIN) 100 MG capsule Take 1 capsule (100 mg total) by mouth 4 (four) times daily. One three times daily 07/22/21   Tanda Rockers, MD  levothyroxine (SYNTHROID) 100 MCG tablet Take 100 mcg by mouth daily before breakfast.    [provider]  metFORMIN (GLUCOPHAGE) 500 MG tablet Take 1 tablet (500 mg total) by mouth 2 (two) times daily with a meal. 08/03/13   Collins, Barnett Applebaum L, PA-C  metoprolol  tartrate (LOPRESSOR) 25 MG tablet Take 1.5 tablets (37.5 mg total) by mouth 2 (two) times daily. 08/13/21 11/11/21  Arnoldo Lenis, MD  Multiple Vitamin (MULITIVITAMIN WITH MINERALS) TABS Take 1 tablet by mouth daily.    [provider]  olmesartan (BENICAR) 5 MG tablet Take 5 mg by mouth daily.    [provider]  ondansetron (ZOFRAN) 4 MG tablet Take 1 tablet (4 mg total) by mouth every 6 (six) hours as needed for nausea. 06/04/21   Allie Bossier, MD  pantoprazole (PROTONIX) 40 MG tablet Take 1 tablet (40 mg total) by mouth daily. Take 30-60 min before first meal of the day 07/22/21   Tanda Rockers, MD  potassium chloride SA (K-DUR,KLOR-CON) 20 MEQ tablet Take 20 mEq by mouth daily. 08/14/13   Gaye Pollack, MD  predniSONE (DELTASONE) 10 MG tablet Take  4 each am x 2 days,   2 each am x 2 days,  1 each am x 2 days and stop 09/14/21   Tanda Rockers, MD  PREVIDENT 5000 BOOSTER PLUS 1.1 % PSTE Place onto teeth. 05/28/21   [provider]  warfarin (COUMADIN) 2.5 MG tablet TAKE AS DIRECTED BY COUMADIN CLINIC. 04/21/21   Arnoldo Lenis, MD    Family History Family History  Problem Relation Age of Onset   Heart disease Brother 68       MI   Rheum arthritis Maternal Grandmother    Asthma Maternal Grandfather    Hypothyroidism Mother    Diabetes Sister    Cirrhosis Father     Social History Social History   Tobacco Use   Smoking status: Never   Smokeless tobacco: Never  Vaping Use   Vaping Use: Never used  Substance Use Topics   Alcohol use: No    Alcohol/week: 0.0 standard drinks   Drug use: No     Allergies   Amiodarone   Review of Systems Review of Systems Per HPI  Physical Exam Triage Vital Signs ED Triage Vitals  Enc Vitals Group     BP 10/26/21 0957 123/65     Pulse Rate 10/26/21 0957 69     Resp 10/26/21 0957 16     Temp 10/26/21 0957 97.6 F (36.4 C)     Temp Source 10/26/21 0957 Oral     SpO2 10/26/21 0957 96 %      Weight --      Height --      Head Circumference --  Peak Flow --      Pain Score 10/26/21 0953 8     Pain Loc --      Pain Edu? --      Excl. in [place]? --    No data found.  Updated Vital Signs BP 123/65 (BP Location: Right Arm)    Pulse 69    Temp 97.6 F (36.4 C) (Oral)    Resp 16    SpO2 96%   Visual Acuity Right Eye Distance:   Left Eye Distance:   Bilateral Distance:    Right Eye Near:   Left Eye Near:    Bilateral Near:     Physical Exam Vitals and nursing note reviewed.  Constitutional:      Appearance: Normal appearance. She is not ill-appearing.  HENT:     Head: Atraumatic.     Nose: Nose normal.     Mouth/Throat:     Mouth: Mucous membranes are moist.  Eyes:     Extraocular Movements: Extraocular movements intact.     Conjunctiva/sclera: Conjunctivae normal.  Cardiovascular:     Rate and Rhythm: Normal rate and regular rhythm.     Pulses: Normal pulses.  Pulmonary:     Effort: Pulmonary effort is normal. No respiratory distress.     Breath sounds: Normal breath sounds. No wheezing or rales.  Musculoskeletal:        General: Tenderness present. No swelling, deformity or signs of injury. Normal range of motion.     Cervical back: Normal range of motion and neck supple.     Comments: Diffuse left shoulder tenderness to palpation at joint lines, tenderness to palpation left deltoid, distal trapezius.  Good range of motion in all directions left upper extremity.  Grip strength full and equal bilateral upper extremities  Skin:    General: Skin is warm and dry.     Findings: No erythema or rash.  Neurological:     Mental Status: She is alert and oriented to person, place, and time.     Motor: No weakness.     Gait: Gait normal.     Comments: Bilateral upper extremities neurovascularly intact  Psychiatric:        Mood and Affect: Mood normal.        Thought Content: Thought content normal.        Judgment: Judgment normal.     UC Treatments / Results   Labs (all labs ordered are listed, but only abnormal results are displayed) Labs Reviewed - No data to display  EKG   Radiology No results found.  Procedures Procedures (including critical care time)  Medications Ordered in UC Medications - No data to display  Initial Impression / Assessment and Plan / UC Course  I have reviewed the triage vital signs and the nursing notes.  Pertinent labs & imaging results that were available during my care of the patient were reviewed by me and considered in my medical decision making (see chart for details).     Vital signs benign and reassuring, exam findings overall reassuring.  Suspect inflammatory shoulder pain causing some irritation to the rounding muscle groups.  We will treat with Voltaren gel, Tylenol, heat, stretches, rest.  Very low-dose muscle relaxer sent for as needed to see if this helps with pain.  Very low suspicion for emergent cause such as DVT, cardiac cause based on exam findings, compliant anticoagulation but did discuss to go to emergency department if symptoms worsening at any point.  Follow-up closely with  PCP for recheck of symptoms.  Final Clinical Impressions(s) / UC Diagnoses   Final diagnoses:  Acute pain of left shoulder     Discharge Instructions      Follow-up with your primary care provider for a recheck within the next week.  Go to the emergency department if your symptoms significantly worsen at any time.    ED Prescriptions     Medication Sig Dispense Auth. Provider   diclofenac Sodium (VOLTAREN) 1 % GEL Apply 2 g topically 4 (four) times daily. 150 g Volney American, [place]   cyclobenzaprine (FLEXERIL) 5 MG tablet Take 0.5 tablets (2.5 mg total) by mouth 3 (three) times daily as needed for muscle spasms. Do not drink alcohol or drive while taking this medication.  May cause drowsiness. 5 tablet Volney American, [place]      PDMP not reviewed this encounter.   Volney American,  [place] 10/26/21 1051

## 2021-10-26 NOTE — ED Triage Notes (Signed)
Patient states her left shoulder started hurting last Friday.   Patient states she was just worried about it because she has had Heart Surgery and Breast Cancer and an Artificial Valve  Patient states she took Tylenol for pain this morning

## 2021-10-27 ENCOUNTER — Ambulatory Visit (INDEPENDENT_AMBULATORY_CARE_PROVIDER_SITE_OTHER): Payer: Medicare PPO | Admitting: *Deleted

## 2021-10-27 DIAGNOSIS — I639 Cerebral infarction, unspecified: Secondary | ICD-10-CM

## 2021-10-27 DIAGNOSIS — I4891 Unspecified atrial fibrillation: Secondary | ICD-10-CM

## 2021-10-27 DIAGNOSIS — Z5181 Encounter for therapeutic drug level monitoring: Secondary | ICD-10-CM | POA: Diagnosis not present

## 2021-10-27 LAB — POCT INR: INR: 3.7 — AB (ref 2.0–3.0)

## 2021-10-27 NOTE — Patient Instructions (Signed)
Hold warfarin tomorrow then resume 1 tablet daily except for 1.5 tablets on Sundays, Tuesdays and Thursdays. Recheck INR in 4 weeks.

## 2021-11-02 ENCOUNTER — Other Ambulatory Visit: Payer: Self-pay | Admitting: Internal Medicine

## 2021-11-03 ENCOUNTER — Encounter: Payer: Self-pay | Admitting: Orthopaedic Surgery

## 2021-11-03 ENCOUNTER — Other Ambulatory Visit: Payer: Self-pay

## 2021-11-03 ENCOUNTER — Ambulatory Visit: Payer: Medicare PPO | Admitting: Orthopaedic Surgery

## 2021-11-03 VITALS — Ht 62.5 in | Wt 175.0 lb

## 2021-11-03 DIAGNOSIS — M25562 Pain in left knee: Secondary | ICD-10-CM | POA: Diagnosis not present

## 2021-11-03 DIAGNOSIS — Z7901 Long term (current) use of anticoagulants: Secondary | ICD-10-CM

## 2021-11-03 DIAGNOSIS — M25561 Pain in right knee: Secondary | ICD-10-CM

## 2021-11-03 MED ORDER — HYDROCODONE-ACETAMINOPHEN 5-325 MG PO TABS: ORAL_TABLET | ORAL | 0 refills | Status: DC

## 2021-11-03 NOTE — Progress Notes (Signed)
Subjective:    Patient ID: [name], female    DOB: Mar 12, 1946, 76 y.o.   MRN: [id number]  HPI She fell Sunday and hit both knees.  She has had ecchymosis and pain, swelling.  She is on coumadin. She is using a cane.  She is concerned about the ecchymosis.  She has some pain in the left shoulder but her main concern is the swelling of the knees.  She has no giving way.  She has no numbness or redness.  She had no head injury.  She has taken Tylenol and it has helped.   Review of Systems  Constitutional:  Positive for activity change.  Respiratory:  Positive for shortness of breath.   Cardiovascular:  Positive for chest pain, palpitations and leg swelling.  Musculoskeletal:  Positive for arthralgias, gait problem, joint swelling and myalgias.  All other systems reviewed and are negative. For Review of Systems, all other systems reviewed and are negative.  The following is a summary of the past history medically, past history surgically, known current medicines, social history and family history.  This information is gathered electronically by the computer from prior information and documentation.  I review this each visit and have found including this information at this point in the chart is beneficial and informative.   Past Medical History:  Diagnosis Date   Anemia    Asthma    Atrial fibrillation ([place])    Breast carcinoma ([place]) 1995   5790   Cardioembolic stroke ([place]) 12/8331   Right frontal in 01/2012; normal carotid ultrasound; possible LAA thrombus by TEE; virtual complete neurologic recovery   Chronic kidney disease, stage 2, mildly decreased GFR    GFR of approximately 60   Depression    Diabetes mellitus without complication ([place])    Diverticulosis of colon (without mention of hemorrhage) 2012   Dr. Laural Golden   Fasting hyperglycemia    120 fasting   Gastroesophageal reflux disease    Gastroparesis    Hemorrhoids    Hyperlipidemia    Hypertension    pt denies  05/30/13     Dr Kathe Mariner   Hyponatremia    Rheumatic heart disease    a. 07/2013 Echo: Ef 55-60%, Mod AS/AI, Mod-Sev MS, sev dil LA, PASP 58;  b. 07/2013 TEE: EF 35-40%, mild-mod AS/AI, mod-sev MS, LA smoke;  c. 07/2013 Cath: elev R heart pressures, nl cors.   Shortness of breath     Past Surgical History:  Procedure Laterality Date   AORTIC VALVE REPLACEMENT N/A 07/18/2013   Procedure: AORTIC VALVE REPLACEMENT (AVR);  Surgeon: Gaye Pollack, MD;  Location: [facility];  Service: Open Heart Surgery;  Laterality: N/A;   BACK SURGERY     BREAST LUMPECTOMY Right 1995   CARDIAC CATHETERIZATION     CARDIOVERSION N/A 07/26/2013   Procedure: CARDIOVERSION;  Surgeon: Fay Records, MD;  Location: [facility];  Service: Cardiovascular;  Laterality: N/A;   COLONOSCOPY  2012   Negative screening procedure   DILATION AND CURETTAGE OF UTERUS     ESOPHAGEAL MANOMETRY N/A 06/17/2013   Procedure: ESOPHAGEAL MANOMETRY (EM);  Surgeon: Sable Feil, MD;  Location: [facility];  Service: Endoscopy;  Laterality: N/A;   INTRAOPERATIVE TRANSESOPHAGEAL ECHOCARDIOGRAM N/A 07/18/2013   Procedure: INTRAOPERATIVE TRANSESOPHAGEAL ECHOCARDIOGRAM;  Surgeon: Gaye Pollack, MD;  Location: [facility];  Service: Open Heart Surgery;  Laterality: N/A;   LEFT AND RIGHT HEART CATHETERIZATION WITH CORONARY ANGIOGRAM N/A 07/10/2013   Procedure: LEFT  AND RIGHT HEART CATHETERIZATION WITH CORONARY ANGIOGRAM;  Surgeon: Burnell Blanks, MD;  Location: [facility] CATH LAB;  Service: Cardiovascular;  Laterality: N/A;   LUMBAR LAMINECTOMY/DECOMPRESSION MICRODISCECTOMY Right 05/13/2014   Procedure: LUMBAR LAMINECTOMY/DECOMPRESSION MICRODISCECTOMY 1 LEVEL  lumbar four/five;  Surgeon: Faythe Ghee, MD;  Location: [facility]O [facility];  Service: Neurosurgery;  Laterality: Right;   MITRAL VALVE REPLACEMENT N/A 07/18/2013   Procedure: MITRAL VALVE (MV) REPLACEMENT;  Surgeon: Gaye Pollack, MD;  Location: [facility];  Service: Open Heart Surgery;   Laterality: N/A;   MITRAL VALVE [place], closed mitral valvulotomy by finger fracture   TEE WITHOUT CARDIOVERSION  01/24/2012   Procedure: TRANSESOPHAGEAL ECHOCARDIOGRAM (TEE);  Surgeon: Lelon Perla, MD;  Location: [facility] ENDOSCOPY;  Service: Cardiovascular;  Laterality: N/A;   TEE WITHOUT CARDIOVERSION N/A 07/11/2013   Procedure: TRANSESOPHAGEAL ECHOCARDIOGRAM (TEE);  Surgeon: Thayer Headings, MD;  Location: [facility];  Service: Cardiovascular;  Laterality: N/A;   TEE WITHOUT CARDIOVERSION N/A 07/26/2013   Procedure: TRANSESOPHAGEAL ECHOCARDIOGRAM (TEE);  Surgeon: Fay Records, MD;  Location: [facility] ENDOSCOPY;  Service: Cardiovascular;  Laterality: N/A;   TUBAL LIGATION  1973    Current Outpatient Medications on File Prior to Visit  Medication Sig Dispense Refill   albuterol (PROVENTIL) (2.5 MG/3ML) 0.083% nebulizer solution Take 3 mLs (2.5 mg total) by nebulization every 4 (four) hours as needed for wheezing or shortness of breath. 75 mL 12   atorvastatin (LIPITOR) 40 MG tablet TAKE ONE TABLET BY MOUTH ONCE DAILY. 90 tablet 2   budesonide (PULMICORT) 0.25 MG/2ML nebulizer solution One 0.25 mg  twice daily  along with albuterol 120 mL 11   cetirizine (ZYRTEC) 10 MG tablet Take 10 mg by mouth daily.     cyclobenzaprine (FLEXERIL) 5 MG tablet Take 0.5 tablets (2.5 mg total) by mouth 3 (three) times daily as needed for muscle spasms. Do not drink alcohol or drive while taking this medication.  May cause drowsiness. 5 tablet 0   dextromethorphan-guaiFENesin (MUCINEX DM) 30-600 MG 12hr tablet Take 1 tablet by mouth 2 (two) times daily.     diclofenac Sodium (VOLTAREN) 1 % GEL Apply 2 g topically 4 (four) times daily. 150 g 0   diltiazem (CARDIZEM CD) 180 MG 24 hr capsule Take 1 capsule (180 mg total) by mouth daily. 30 capsule 1   escitalopram (LEXAPRO) 10 MG tablet Take 10 mg by mouth daily.      famotidine (PEPCID) 20 MG tablet TAKE 1 TABLET AFTER SUPPER 30 tablet 3   fluticasone  (FLONASE) 50 MCG/ACT nasal spray Place 1 spray into both nostrils daily as needed for allergies.      furosemide (LASIX) 20 MG tablet TAKE (1) TABLET BY MOUTH ONCE DAILY FOR SWELLING. 90 tablet 0   gabapentin (NEURONTIN) 100 MG capsule Take 1 capsule (100 mg total) by mouth 4 (four) times daily. One three times daily 120 capsule 2   levothyroxine (SYNTHROID) 100 MCG tablet Take 100 mcg by mouth daily before breakfast.     metFORMIN (GLUCOPHAGE) 500 MG tablet Take 1 tablet (500 mg total) by mouth 2 (two) times daily with a meal. 60 tablet 1   metoprolol tartrate (LOPRESSOR) 25 MG tablet Take 1.5 tablets (37.5 mg total) by mouth 2 (two) times daily. 270 tablet 3   Multiple Vitamin (MULITIVITAMIN WITH MINERALS) TABS Take 1 tablet by mouth daily.     olmesartan (BENICAR) 5 MG tablet Take 5 mg by mouth daily.  ondansetron (ZOFRAN) 4 MG tablet Take 1 tablet (4 mg total) by mouth every 6 (six) hours as needed for nausea. 20 tablet 0   pantoprazole (PROTONIX) 40 MG tablet Take 1 tablet (40 mg total) by mouth daily. Take 30-60 min before first meal of the day 30 tablet 2   potassium chloride SA (K-DUR,KLOR-CON) 20 MEQ tablet Take 20 mEq by mouth daily.     predniSONE (DELTASONE) 10 MG tablet Take  4 each am x 2 days,   2 each am x 2 days,  1 each am x 2 days and stop 14 tablet 0   PREVIDENT 5000 BOOSTER PLUS 1.1 % PSTE Place onto teeth.     warfarin (COUMADIN) 2.5 MG tablet TAKE AS DIRECTED BY COUMADIN CLINIC. 45 tablet 6   No current facility-administered medications on file prior to visit.    Social History   Socioeconomic History   Marital status: Married    Spouse name: Not on file   Number of children: 1   Years of education: Not on file   Highest education level: Not on file  Occupational History   Occupation: Retired Product manager: RETIRED  Tobacco Use   Smoking status: Never   Smokeless tobacco: Never  Vaping Use   Vaping Use: Never used  Substance and Sexual Activity    Alcohol use: No    Alcohol/week: 0.0 standard drinks   Drug use: No   Sexual activity: Not Currently  Other Topics Concern   Not on file  Social History Narrative   Married with children   No regular exercise   Social Determinants of Health   Financial Resource Strain: Not on file  Food Insecurity: Not on file  Transportation Needs: Not on file  Physical Activity: Not on file  Stress: Not on file  Social Connections: Not on file  Intimate Partner Violence: Not on file    Family History  Problem Relation Age of Onset   Heart disease Brother 2       MI   Rheum arthritis Maternal Grandmother    Asthma Maternal Grandfather    Hypothyroidism Mother    Diabetes Sister    Cirrhosis Father     Ht 5' 2.5" (1.588 m)    Wt 175 lb (79.4 kg)    BMI 31.50 kg/m   Body mass index is 31.5 kg/m.     Objective:   Physical Exam Vitals and nursing note reviewed. Exam conducted with a chaperone present.  Constitutional:      Appearance: She is well-developed.  HENT:     Head: Normocephalic and atraumatic.  Eyes:     Conjunctiva/sclera: Conjunctivae normal.     Pupils: Pupils are equal, round, and reactive to light.  Cardiovascular:     Rate and Rhythm: Normal rate and regular rhythm.  Pulmonary:     Effort: Pulmonary effort is normal.  Abdominal:     Palpations: Abdomen is soft.  Musculoskeletal:     Cervical back: Normal range of motion and neck supple.       Legs:  Skin:    General: Skin is warm and dry.  Neurological:     Mental Status: She is alert and oriented to person, place, and time.     Cranial Nerves: No cranial nerve deficit.     Motor: No abnormal muscle tone.     Coordination: Coordination normal.     Deep Tendon Reflexes: Reflexes are normal and symmetric. Reflexes normal.  Psychiatric:  Behavior: Behavior normal.        Thought Content: Thought content normal.        Judgment: Judgment normal.  X-rays were done of both knees, reported  separately.  There is medial narrowing bilaterally, No fracture or loose body noted.  Bone quality is good.          Assessment & Plan:   Encounter Diagnoses  Name Primary?   Acute pain of left knee Yes   Acute pain of right knee    Anticoagulated on Coumadin    PROCEDURE NOTE:  The patient requests injections of the left knee , verbal consent was obtained.  The left knee was prepped appropriately after time out was performed.   Sterile technique was observed and injection of 1 cc of DepoMedrol 40 mg with several cc's of plain xylocaine. Anesthesia was provided by ethyl chloride and a 20-gauge needle was used to inject the knee area. The injection was tolerated well.  A band aid dressing was applied.  The patient was advised to apply ice later today and tomorrow to the injection sight as needed. PROCEDURE NOTE:  The patient requests injections of the right knee , verbal consent was obtained.  The right knee was prepped appropriately after time out was performed.   Sterile technique was observed and injection of 1 cc of DepoMedrol 40mg  with several cc's of plain xylocaine. Anesthesia was provided by ethyl chloride and a 20-gauge needle was used to inject the knee area. The injection was tolerated well.  A band aid dressing was applied.  The patient was advised to apply ice later today and tomorrow to the injection sight as needed.  Return in one week.  Call if any problem.  Precautions discussed.  Electronically Signed Sanjuana Kava, MD 2/1/202311:11 AM

## 2021-11-08 DIAGNOSIS — S8000XA Contusion of unspecified knee, initial encounter: Secondary | ICD-10-CM | POA: Diagnosis not present

## 2021-11-10 ENCOUNTER — Emergency Department (HOSPITAL_COMMUNITY): Payer: Medicare PPO

## 2021-11-10 ENCOUNTER — Other Ambulatory Visit: Payer: Self-pay

## 2021-11-10 ENCOUNTER — Emergency Department (HOSPITAL_COMMUNITY)
Admission: EM | Admit: 2021-11-10 | Discharge: 2021-11-11 | Disposition: A | Discharge status: Home / Self Care | Payer: Medicare PPO | Attending: Emergency Medicine | Admitting: Emergency Medicine

## 2021-11-10 ENCOUNTER — Encounter (HOSPITAL_COMMUNITY): Payer: Self-pay | Admitting: *Deleted

## 2021-11-10 DIAGNOSIS — S8011XA Contusion of right lower leg, initial encounter: Secondary | ICD-10-CM | POA: Insufficient documentation

## 2021-11-10 DIAGNOSIS — Z7901 Long term (current) use of anticoagulants: Secondary | ICD-10-CM | POA: Diagnosis not present

## 2021-11-10 DIAGNOSIS — Z7984 Long term (current) use of oral hypoglycemic drugs: Secondary | ICD-10-CM | POA: Insufficient documentation

## 2021-11-10 DIAGNOSIS — M7989 Other specified soft tissue disorders: Secondary | ICD-10-CM | POA: Diagnosis not present

## 2021-11-10 DIAGNOSIS — W1830XA Fall on same level, unspecified, initial encounter: Secondary | ICD-10-CM | POA: Insufficient documentation

## 2021-11-10 DIAGNOSIS — N390 Urinary tract infection, site not specified: Secondary | ICD-10-CM | POA: Insufficient documentation

## 2021-11-10 DIAGNOSIS — S8991XA Unspecified injury of right lower leg, initial encounter: Secondary | ICD-10-CM | POA: Diagnosis present

## 2021-11-10 DIAGNOSIS — R791 Abnormal coagulation profile: Secondary | ICD-10-CM

## 2021-11-10 LAB — CBC WITH DIFFERENTIAL/PLATELET
Abs Immature Granulocytes: 0.13 10*3/uL — ABNORMAL HIGH (ref 0.00–0.07)
Basophils Absolute: 0.1 10*3/uL (ref 0.0–0.1)
Basophils Relative: 1 %
Eosinophils Absolute: 0.1 10*3/uL (ref 0.0–0.5)
Eosinophils Relative: 1 %
HCT: 33.8 % — ABNORMAL LOW (ref 36.0–46.0)
Hemoglobin: 10.8 g/dL — ABNORMAL LOW (ref 12.0–15.0)
Immature Granulocytes: 1 %
Lymphocytes Relative: 11 %
Lymphs Abs: 1.2 10*3/uL (ref 0.7–4.0)
MCH: 30.3 pg (ref 26.0–34.0)
MCHC: 32 g/dL (ref 30.0–36.0)
MCV: 94.9 fL (ref 80.0–100.0)
Monocytes Absolute: 1.1 10*3/uL — ABNORMAL HIGH (ref 0.1–1.0)
Monocytes Relative: 11 %
Neutro Abs: 8.1 10*3/uL — ABNORMAL HIGH (ref 1.7–7.7)
Neutrophils Relative %: 75 %
Platelets: 226 10*3/uL (ref 150–400)
RBC: 3.56 MIL/uL — ABNORMAL LOW (ref 3.87–5.11)
RDW: 14.9 % (ref 11.5–15.5)
WBC: 10.6 10*3/uL — ABNORMAL HIGH (ref 4.0–10.5)
nRBC: 0 % (ref 0.0–0.2)

## 2021-11-10 LAB — COMPREHENSIVE METABOLIC PANEL
ALT: 36 U/L (ref 0–44)
AST: 25 U/L (ref 15–41)
Albumin: 3.8 g/dL (ref 3.5–5.0)
Alkaline Phosphatase: 51 U/L (ref 38–126)
Anion gap: 10 (ref 5–15)
BUN: 22 mg/dL (ref 8–23)
CO2: 26 mmol/L (ref 22–32)
Calcium: 8.9 mg/dL (ref 8.9–10.3)
Chloride: 97 mmol/L — ABNORMAL LOW (ref 98–111)
Creatinine, Ser: 0.99 mg/dL (ref 0.44–1.00)
GFR, Estimated: 59 mL/min — ABNORMAL LOW (ref >60)
Glucose, Bld: 182 mg/dL — ABNORMAL HIGH (ref 70–99)
Potassium: 4.4 mmol/L (ref 3.5–5.1)
Sodium: 133 mmol/L — ABNORMAL LOW (ref 135–145)
Total Bilirubin: 1 mg/dL (ref 0.3–1.2)
Total Protein: 6.6 g/dL (ref 6.5–8.1)

## 2021-11-10 NOTE — ED Triage Notes (Signed)
Pt is on coumadin and fell last week. Per husband pt with new bruising from before her right knee down to right foot. Husband states she has been twice for the same fall.

## 2021-11-10 NOTE — ED Provider Notes (Signed)
[facility] EMERGENCY DEPARTMENT Provider Note   CSN: [id number] Arrival date & time: 11/10/21  2144     History  Chief Complaint  Patient presents with   Bleeding/Bruising    [name] is a 76 y.o. female.  76 year old female who presents to the emerged from today secondary to bruising to her right leg.  Patient apparently had a fall a little over a week ago.  She was seen in urgent care and sent home.  Follow-up with orthopedics and had a joint injection.  Persistent pain and swelling but then today the husband noticed that she had worsening blood down her leg and is worried about internal bleeding.  She is on Coumadin for valve replacement.  No new injuries.  Patient feels a bit nausea and some suprapubic pain but no other changes in urination.  No fevers.  No back pain.  No headaches       Home Medications Prior to Admission medications   Medication Sig Start Date End Date Taking? Authorizing Provider  albuterol (PROVENTIL) (2.5 MG/3ML) 0.083% nebulizer solution Take 3 mLs (2.5 mg total) by nebulization every 4 (four) hours as needed for wheezing or shortness of breath. 07/22/21   Tanda Rockers, MD  atorvastatin (LIPITOR) 40 MG tablet TAKE ONE TABLET BY MOUTH ONCE DAILY. 10/18/21   Arnoldo Lenis, MD  budesonide (PULMICORT) 0.25 MG/2ML nebulizer solution One 0.25 mg  twice daily  along with albuterol 09/14/21   Tanda Rockers, MD  cetirizine (ZYRTEC) 10 MG tablet Take 10 mg by mouth daily.    [provider]  cyclobenzaprine (FLEXERIL) 5 MG tablet Take 0.5 tablets (2.5 mg total) by mouth 3 (three) times daily as needed for muscle spasms. Do not drink alcohol or drive while taking this medication.  May cause drowsiness. 10/26/21   Volney American, PA-C  dextromethorphan-guaiFENesin [facility] DM) 30-600 MG 12hr tablet Take 1 tablet by mouth 2 (two) times daily.    [provider]  diclofenac Sodium (VOLTAREN) 1 % GEL Apply 2 g topically 4 (four)  times daily. 10/26/21   Volney American, PA-C  diltiazem (CARDIZEM CD) 180 MG 24 hr capsule Take 1 capsule (180 mg total) by mouth daily. 08/03/13   Collins, Gina L, PA-C  escitalopram (LEXAPRO) 10 MG tablet Take 10 mg by mouth daily.  04/10/15   [provider]  famotidine (PEPCID) 20 MG tablet TAKE 1 TABLET AFTER SUPPER 09/20/21   Tanda Rockers, MD  fluticasone [facility]) 50 MCG/ACT nasal spray Place 1 spray into both nostrils daily as needed for allergies.  09/05/13   Arnoldo Lenis, MD  furosemide (LASIX) 20 MG tablet TAKE (1) TABLET BY MOUTH ONCE DAILY FOR SWELLING. 08/30/21   Arnoldo Lenis, MD  gabapentin (NEURONTIN) 100 MG capsule Take 1 capsule (100 mg total) by mouth 4 (four) times daily. One three times daily 07/22/21   Tanda Rockers, MD  HYDROcodone-acetaminophen (NORCO/VICODIN) 5-325 MG tablet One tablet every four hours as needed for acute pain.  Limit of five days per [place] statue. 11/03/21   Sanjuana Kava, MD  levothyroxine (SYNTHROID) 100 MCG tablet Take 100 mcg by mouth daily before breakfast.    [provider]  metFORMIN (GLUCOPHAGE) 500 MG tablet Take 1 tablet (500 mg total) by mouth 2 (two) times daily with a meal. 08/03/13   Collins, Gina L, PA-C  metoprolol tartrate (LOPRESSOR) 25 MG tablet Take 1.5 tablets (37.5 mg total) by mouth 2 (two) times  daily. 08/13/21 11/11/21  Arnoldo Lenis, MD  Multiple Vitamin (MULITIVITAMIN WITH MINERALS) TABS Take 1 tablet by mouth daily.    [provider]  olmesartan (BENICAR) 5 MG tablet Take 5 mg by mouth daily.    [provider]  ondansetron (ZOFRAN) 4 MG tablet Take 1 tablet (4 mg total) by mouth every 6 (six) hours as needed for nausea. 06/04/21   Allie Bossier, MD  pantoprazole (PROTONIX) 40 MG tablet TAKE 1 TABLET 30 TO 60 MINUTES BEFORE FIRST MEAL OF THE DAY. 11/03/21   Tanda Rockers, MD  potassium chloride SA (K-DUR,KLOR-CON) 20 MEQ tablet Take 20 mEq by mouth daily. 08/14/13    Gaye Pollack, MD  predniSONE (DELTASONE) 10 MG tablet Take  4 each am x 2 days,   2 each am x 2 days,  1 each am x 2 days and stop 09/14/21   Tanda Rockers, MD  PREVIDENT 5000 BOOSTER PLUS 1.1 % PSTE Place onto teeth. 05/28/21   [provider]  warfarin (COUMADIN) 2.5 MG tablet TAKE AS DIRECTED BY COUMADIN CLINIC. 04/21/21   Arnoldo Lenis, MD      Allergies    Amiodarone    Review of Systems   Review of Systems  Physical Exam Updated Vital Signs BP (!) 136/96    Pulse 66    Temp 98 F (36.7 C) (Oral)    Resp 17    Ht 5\' 2"  (1.575 m)    Wt 79.4 kg    SpO2 92%    BMI 32.01 kg/m  Physical Exam Vitals and nursing note reviewed.  Constitutional:      Appearance: She is well-developed.  HENT:     Head: Normocephalic and atraumatic.     Nose: No congestion or rhinorrhea.  Eyes:     Pupils: Pupils are equal, round, and reactive to light.  Cardiovascular:     Rate and Rhythm: Normal rate and regular rhythm.  Pulmonary:     Effort: No respiratory distress.     Breath sounds: No stridor.  Abdominal:     General: There is no distension.  Musculoskeletal:        General: Swelling (Right lower extremity with ecchymosis on the medial side of medial side mildly tender to touch pulses intact) present. Normal range of motion.     Cervical back: Normal range of motion.  Skin:    General: Skin is warm and dry.  Neurological:     Mental Status: She is alert.    ED Results / Procedures / Treatments   Labs (all labs ordered are listed, but only abnormal results are displayed) Labs Reviewed  CBC WITH DIFFERENTIAL/PLATELET  COMPREHENSIVE METABOLIC PANEL  PROTIME-INR    EKG None  Radiology No results found.  Procedures Procedures    Medications Ordered in ED Medications - No data to display  ED Course/ Medical Decision Making/ A&P                           Medical Decision Making Amount and/or Complexity of Data Reviewed Labs: ordered. Radiology:  ordered.  Risk Prescription drug management.   The bleeding her leg appears to be old blood.  I suspect that just became noticeable today for some reason.  Her INR is elevated so we will give a low-dose of vitamin K here and have her hold her Coumadin tomorrow take half dose the next couple days and then return to  full dosing with her INR clinic follow-up on Monday or Tuesday.  She also does appear to have a urinary tract infection so we will treat for that as well.  Low suspicion for [place] or systemic disease at this time.  No sepsis   Final Clinical Impression(s) / ED Diagnoses Final diagnoses:  None    Rx / DC Orders ED Discharge Orders     None         Neria Procter, Corene Cornea, MD 11/11/21 [phone]

## 2021-11-11 LAB — URINALYSIS, ROUTINE W REFLEX MICROSCOPIC
Bilirubin Urine: NEGATIVE
Glucose, UA: NEGATIVE mg/dL
Ketones, ur: NEGATIVE mg/dL
Nitrite: NEGATIVE
Protein, ur: NEGATIVE mg/dL
Specific Gravity, Urine: 1.008 (ref 1.005–1.030)
Trans Epithel, UA: 1
WBC, UA: >50 WBC/hpf — ABNORMAL HIGH (ref 0–5)
pH: 6 (ref 5.0–8.0)

## 2021-11-11 LAB — PROTIME-INR
INR: 7.5 (ref 0.8–1.2)
Prothrombin Time: 63.5 seconds — ABNORMAL HIGH (ref 11.4–15.2)

## 2021-11-11 MED ORDER — ONDANSETRON 8 MG PO TBDP
8.0000 mg | ORAL_TABLET | Freq: Once | ORAL | Status: AC
  Administered 2021-11-11: 8 mg via ORAL
  Filled 2021-11-11: qty 1

## 2021-11-11 MED ORDER — NITROFURANTOIN MONOHYD MACRO 100 MG PO CAPS: 100.0000 mg | ORAL_CAPSULE | Freq: Two times a day (BID) | ORAL | 0 refills | Status: AC

## 2021-11-11 MED ORDER — NITROFURANTOIN MACROCRYSTAL 100 MG PO CAPS
100.0000 mg | ORAL_CAPSULE | Freq: Once | ORAL | Status: DC
  Filled 2021-11-11: qty 1

## 2021-11-11 MED ORDER — PHYTONADIONE 5 MG PO TABS
2.5000 mg | ORAL_TABLET | Freq: Once | ORAL | Status: AC
  Administered 2021-11-11: 2.5 mg via ORAL
  Filled 2021-11-11: qty 1

## 2021-11-11 MED ORDER — ONDANSETRON 4 MG PO TBDP: ORAL_TABLET | ORAL | 0 refills | Status: DC

## 2021-11-11 MED ORDER — NITROFURANTOIN MONOHYD MACRO 100 MG PO CAPS
100.0000 mg | ORAL_CAPSULE | Freq: Two times a day (BID) | ORAL | Status: DC
  Administered 2021-11-11: 100 mg via ORAL
  Filled 2021-11-11: qty 1

## 2021-11-11 NOTE — ED Notes (Signed)
Signature pad not working in rm 9. Pt given d/c paperwork, f/u care reviewed, verbalized understanding. No concerns at this time. To lobby via wheelchair. Spouse providing transportation home.

## 2021-11-11 NOTE — Discharge Instructions (Addendum)
Hold your dose of coumadin for Thursday Take half a dose on Friday and Saturday Resume normal dosing on Sunday  Get your INR rechecked Monday/Tuesday at your office.

## 2021-11-13 LAB — URINE CULTURE: Culture: >=100000 — AB

## 2021-11-14 ENCOUNTER — Telehealth (HOSPITAL_BASED_OUTPATIENT_CLINIC_OR_DEPARTMENT_OTHER): Payer: Self-pay | Admitting: *Deleted

## 2021-11-14 NOTE — Telephone Encounter (Signed)
Post ED Visit - Positive Culture Follow-up  Culture report reviewed by antimicrobial stewardship pharmacist: [place] Team []  Elenor Quinones, Pharm.D. []  Heide Guile, Pharm.D., BCPS AQ-ID []  Parks Neptune, Pharm.D., BCPS []  Alycia Rossetti, Pharm.D., BCPS []  [place], Pharm.D., BCPS, AAHIVP []  Legrand Como, Pharm.D., BCPS, AAHIVP []  Salome Arnt, PharmD, BCPS []  Johnnette Gourd, PharmD, BCPS []  Hughes Better, PharmD, BCPS []  Leeroy Cha, PharmD []  Laqueta Linden, PharmD, BCPS [x]  Phil Dopp, PharmD  [place] Team []  Leodis Sias, PharmD []  Lindell Spar, PharmD []  Royetta Asal, PharmD []  Graylin Shiver, Rph []  Rema Fendt) Glennon Mac, PharmD []  Arlyn Dunning, PharmD []  Netta Cedars, PharmD []  Dia Sitter, PharmD []  Leone Haven, PharmD []  Gretta Arab, PharmD []  Theodis Shove, PharmD []  Peggyann Juba, PharmD []  Reuel Boom, PharmD   Positive urine culture Treated with Nitrofurantoin , organism sensitive to the same and no further patient follow-up is required at this time.  Rosie Fate 11/14/2021, 2:29 PM

## 2021-11-17 ENCOUNTER — Encounter: Payer: Self-pay | Admitting: Orthopaedic Surgery

## 2021-11-17 ENCOUNTER — Ambulatory Visit (INDEPENDENT_AMBULATORY_CARE_PROVIDER_SITE_OTHER): Payer: Medicare PPO | Admitting: Orthopaedic Surgery

## 2021-11-17 DIAGNOSIS — M25562 Pain in left knee: Secondary | ICD-10-CM | POA: Diagnosis not present

## 2021-11-17 DIAGNOSIS — Z7901 Long term (current) use of anticoagulants: Secondary | ICD-10-CM

## 2021-11-17 DIAGNOSIS — M25561 Pain in right knee: Secondary | ICD-10-CM | POA: Diagnosis not present

## 2021-11-17 NOTE — Progress Notes (Signed)
I Am better.  [name] has less pain of both knees.  [name] has considerable resolving ecchymosis from the fall [name] had as [name] is on Coumadin.  [name] has been using her cane.  The injections helped.  [name] has no new trauma.  Both knees have significant ecchymosis resolving from the fall.  ROM of the right knee is 0 to 105, left 0 to 100 but right knee is more tender.  Gait is good.  NV intact.  Both knees are stable.  Encounter Diagnoses  Name Primary?   Acute pain of left knee Yes   Acute pain of right knee    Anticoagulated on Coumadin    Continue as [name] is doing.  I will see her in one month.  Call if more pain.  Call if any problem.  Precautions discussed.  Electronically Signed Sanjuana Kava, MD 2/15/20239:51 AM

## 2021-11-19 ENCOUNTER — Ambulatory Visit (INDEPENDENT_AMBULATORY_CARE_PROVIDER_SITE_OTHER): Payer: Medicare PPO | Admitting: *Deleted

## 2021-11-19 ENCOUNTER — Other Ambulatory Visit: Payer: Self-pay

## 2021-11-19 DIAGNOSIS — I4891 Unspecified atrial fibrillation: Secondary | ICD-10-CM

## 2021-11-19 DIAGNOSIS — I639 Cerebral infarction, unspecified: Secondary | ICD-10-CM | POA: Diagnosis not present

## 2021-11-19 DIAGNOSIS — Z5181 Encounter for therapeutic drug level monitoring: Secondary | ICD-10-CM | POA: Diagnosis not present

## 2021-11-19 DIAGNOSIS — Z952 Presence of prosthetic heart valve: Secondary | ICD-10-CM

## 2021-11-19 LAB — POCT INR: INR: 5 — AB (ref 2.0–3.0)

## 2021-11-19 NOTE — Patient Instructions (Signed)
Hold warfarin tonight and tomorrow night then resume 1 tablet daily except for 1.5 tablets on Sundays, Tuesdays and Thursdays. Recheck INR in 1 week.

## 2021-11-24 ENCOUNTER — Ambulatory Visit (INDEPENDENT_AMBULATORY_CARE_PROVIDER_SITE_OTHER): Payer: Medicare PPO | Admitting: *Deleted

## 2021-11-24 DIAGNOSIS — I639 Cerebral infarction, unspecified: Secondary | ICD-10-CM | POA: Diagnosis not present

## 2021-11-24 DIAGNOSIS — I4891 Unspecified atrial fibrillation: Secondary | ICD-10-CM | POA: Diagnosis not present

## 2021-11-24 DIAGNOSIS — Z5181 Encounter for therapeutic drug level monitoring: Secondary | ICD-10-CM | POA: Diagnosis not present

## 2021-11-24 LAB — POCT INR: INR: 3 (ref 2.0–3.0)

## 2021-11-24 NOTE — Patient Instructions (Signed)
Continue warfarin 1 tablet daily except for 1.5 tablets on Sundays, Tuesdays and Thursdays. Recheck INR in 3 weeks.

## 2021-11-30 DIAGNOSIS — E1129 Type 2 diabetes mellitus with other diabetic kidney complication: Secondary | ICD-10-CM | POA: Diagnosis not present

## 2021-11-30 DIAGNOSIS — E785 Hyperlipidemia, unspecified: Secondary | ICD-10-CM | POA: Diagnosis not present

## 2021-12-01 ENCOUNTER — Other Ambulatory Visit: Payer: Self-pay | Admitting: Internal Medicine

## 2021-12-13 ENCOUNTER — Ambulatory Visit: Payer: Medicare PPO | Admitting: Internal Medicine

## 2021-12-13 ENCOUNTER — Encounter: Payer: Self-pay | Admitting: Internal Medicine

## 2021-12-13 ENCOUNTER — Other Ambulatory Visit: Payer: Self-pay

## 2021-12-13 DIAGNOSIS — R058 Other specified cough: Secondary | ICD-10-CM | POA: Diagnosis not present

## 2021-12-13 DIAGNOSIS — R0609 Other forms of dyspnea: Secondary | ICD-10-CM | POA: Diagnosis not present

## 2021-12-13 MED ORDER — BUDESONIDE 0.25 MG/2ML IN SUSP: RESPIRATORY_TRACT | 11 refills | Status: DC

## 2021-12-13 MED ORDER — PREDNISONE 10 MG PO TABS: ORAL_TABLET | ORAL | 0 refills | Status: DC

## 2021-12-13 NOTE — Progress Notes (Unsigned)
[name], female    DOB: 06-17-1946      MRN: [id number]   Brief patient profile:  76  yowf never smoker with a h/o sob related to valvular heart dz    Seen prior to Epic in [place]: Feb 20, 2009 initial pulmonary eval started aerobics 3 months prior to OV  X 30 min weekly and tolerated well with improving ex tol until 3 weeks prior to OV   noted decrease tolerance and since then gradually worse to point where walking to mailbox slt uphill or climbing steps has to stop at top.  In retrospect coughing for 3 months dry, worse immediately on lying down. due to the cough lisnopril was stopped and could not tolerate the substitute and cough hasn't changed but breathing got worse. prednisone no help so far and proaire no benefit. rec GERD diet   continue prilosec and reglan stop prednisone and proaire  avoid salt  maxzide 25 one each am and Dr Izell [place] office   10/16/12 Meth challenge  + for hyperreactive airways disease  clance eval 05/14/13 [name] that at this point is clearly upper airway in origin.  [name], but her current cough failed to respond to prednisone tapers.  Therefore it is unlikely that it is related to cough variant asthma.  She does not have chronic sinusitis by her CT scan, and there is nothing on chest x-ray earlier in the year to explain her cough.  She does have a history of breast cancer, but there has been no evidence for recurrence.  [name], but this has an extremely low yield.  [name], and therefore I am concerned she may be having laryngopharyngeal reflux as a cause for her cough.  I also think she may have a cyclical mechanism as well she did have some response to tramadol, and we could consider a trial of gabapentin  AVR  07/18/13   04/2020 echo  1. Left ventricular ejection fraction, by estimation, is 60 to 65%. The   left ventricle has normal function. The left ventricle has no regional  wall motion abnormalities. Left ventricular diastolic parameters are  indeterminate.   2. Right ventricular systolic function is low normal. The right  ventricular size is normal. There is moderately elevated pulmonary artery  systolic pressure. The estimated right ventricular systolic pressure is  83.1 mmHg.   3. Left atrial size was mildly dilated.   4. The mitral valve has been repaired/replaced. No evidence of mitral  valve regurgitation. The mean mitral valve gradient is 5.0 mmHg. There is  a 25 mm Magna Ease bioprosthetic valve present in the mitral position.  Grossly normal function.   5. The aortic valve has been repaired/replaced. Aortic valve  regurgitation is not visualized. There is a 21 mm Magna Ease bioprosthetic  valve present in the aortic position. Aortic valve mean gradient measures  7.0 mmHg. Grossly normal function.   6. The inferior vena cava is normal in size with greater than 50%  respiratory variability, suggesting right atrial pressure of 3 mmHg.        History of Present Illness  06/23/2020  Pulmonary/ 1st office eval/ Korine Winton / [employer] [facility] / re establish re cough and sob on flovent 44 2bid  For cough variant asthma  Chief Complaint  Patient presents with   Consult    Patient has  a dry cough that has been going on for many years but worse over the last 6 months. Shortness of breath with exertion.   Dyspnea:  Walks dog x 30 min / mostly flat uses cane to balance/ steps ok slowly due to breathing problems x sev years prior to OV   Cough: worse in afternoons and sometimes hs has to sit up 30 degrees to prevent cough supine  Sleep: ok as long as head is up 30 degrees new x 2 years  SABA use: sev times a day seems to help at 4 pm rec GERD diet  Pantoprazole (protonix) 40 mg   Take  30-60 min before first meal of the day and Pepcid (famotidine)  20 mg one after supper until return to  office - this is the best way to tell whether stomach acid is contributing to your problem.   Plan A = Automatic = Always=    flovent Take 2 puffs first thing in am and then another 2 puffs about 12 hours later.  Work on inhaler technique:  Plan B = Backup (to supplement plan A, not to replace it) Only use your albuterol inhaler as a rescue medication   Please schedule a follow up office visit in 6 weeks, call sooner if needed - bring inhalers  Next step ? Gabapentin?   08/05/2020  f/u ov/[place] office/Alahia Whicker re: cough > sob x years  Chief Complaint  Patient presents with   Follow-up    non productive cough   Dyspnea:  Not limited by breathing from desired activities  / plays golf/ walks dog  Cough: dry ? Worse with activity (husband says not reproducible ?  worse p supper  Sleeping: most nights waking up  SABA use: once a day  02: none  rec Depomedrol 120 mg IM  Plan A = Automatic = Always=    symbicort 80 Take 2 puffs first thing in am and then another 2 puffs about 12 hours later.  Work on inhaler technique:     Plan B = Backup (to supplement plan A, not to replace it) Only use your albuterol inhaler as a rescue medication   Please schedule a follow up office visit in 6 weeks, call sooner if needed  - bring inhalers and the empty cannister of symbicort    09/16/2020  f/u ov/[place] office/Coree Riester re: cough  x years ? Decades "nothing has ever helped" no better p depo last ov, not ever transiently  Chief Complaint  Patient presents with   Follow-up    Cough is unchanged since the last visit- prod with white sputum.  She is using her albuterol inhaler once per day usually around 4-5 pm.   Dyspnea:  Walks dog, uses cane / some balance issues limiting more than sob and still plays golf Cough: worse  p supper despite pepcid Sleeping: does better in recliner 30 degrees  SABA use: once a day around 5pm seems to help though note hfa very poor baseline  02: none Work on inhaler  technique:   Gabapentin 100 mg twice a day for a week then 4 x daily  Please remember to go to the  x-ray department  @  [facility] for your tests - we will call you with the results when they are available    Add Needs f/u cxr  ? rml syndrome p rx mucinex dm > then ct chest/ sinus if not better  and check allergy profile on return  10/29/2020  f/u ov/[place] office/Sircharles Holzheimer re: chronic cough  Chief Complaint  Patient presents with   Followup     Cough is unchanged since the last visit. She has used her albuterol inhaler twice since her last visit.   Dyspnea:  Slowed more balance than breathing, walks with suction cup cane Cough: dry cough day > noct Sleeping: able to sleep sitting up 30 degrees s sob or cough  SABA use: rare 02: none  Rec We will call to schedule you CT sinus (neg) and chest = ? Scarring from Breast RT (note surgery/RT in early 90's and cough started p that      07/22/2021  f/u ov/Mishka Stegemann re: cough > sob    maint on flovent  for dx of MCT pos cough variant asthma / stopped gabapentin ? Why ? Chief Complaint  Patient presents with   Follow-up    Productive cough with thick white mucus   Dyspnea:  mailbox and back is 200 yards slow pace Cough: daytime cough / lots of throat clearing  Sleeping: 30 degrees ok  SABA use: using saba bid/ poor flovent use  02: none  Covid status:   vax x 5  Rec Pantoprazole (protonix) 40 mg   Take  30-60 min before first meal of the day and Pepcid (famotidine)  20 mg after supper until return to office   Gabapentin 100 mg four times daily  Plan A = Automatic = Always=   Symbicort 80 mg  Take 2 puffs first thing in am and then another 2 puffs about 12 hours later. (Stop flovent and atrovent- unless can't afford symbicort on your plan) Plan B = Backup (to supplement plan A, not to replace it) Only use your albuterol inhaler as a rescue medication   Plan C = Crisis (instead of Plan B but only if Plan B stops working) - only  use your albuterol nebulizer if you first try Plan B   For cough > mucinex dm 1200 every 12 hours as needed    09/14/2021  f/u ov/[place] office/Tolbert Matheson re: uacs vs asthma maint on symb  80 2bid  Chief Complaint  Patient presents with   Follow-up    Cough with thick white mucus is "about the same" since last OV.   Dyspnea:  walking with dog and mailbox with cane multiprong Cough: daytime, congested but just min white mucus despite mucinex dm 1200 bid and gerd rx  Sleeping: fine at 30 degrees s awakening due to cough or any other resp problem SABA use: sometime helps, pred helpss ore  02: none  Rec Stop all inhalers since not able to use them correctly  Plan A = Automatic = Always=   Albuterol 2.5 mg with budesonide 0.25 every 12hours  Plan B = Backup (to supplement plan A, not to replace it) Only use your albuterol nebulizer  as a rescue medication Prednisone 10 mg take  4 each am x 2 days,   2 each am x 2 days,  1 each am x 2 days and stop      12/13/2021  f/u ov/[place] office/Kerrick Miler re: uacs vs asthma maint on bud 0.5 one half vial in am and pm, no albuterol (husband thought I was ordering the bud/alb combined in same fial  Cough x years "never better"    Chief Complaint  Patient presents with   Follow-up    Still coughing up thick white mucus.   SOB   Dyspnea:  walking dog 1/4 mile and  out of breath when returns  Cough: daytime dry, better with pred or narcs  Sleeping: fine s cough  SABA use: 30 degrees hob  02: none  Covid status: x 5 vax      No obvious day to day or daytime variability or assoc excess/ purulent sputum or mucus plugs or hemoptysis or cp or chest tightness, subjective wheeze or overt sinus or hb symptoms.   *** without nocturnal  or early am exacerbation  of respiratory  c/o's or need for noct saba. Also denies any obvious fluctuation of symptoms with weather or environmental changes or other aggravating or alleviating factors except as outlined above    No unusual exposure hx or h/o childhood pna/ asthma or knowledge of premature birth.  Current Allergies, Complete Past Medical History, Past Surgical History, Family History, and Social History were reviewed in [employer] record.  ROS  The following are not active complaints unless bolded Hoarseness, sore throat, dysphagia, dental problems, itching, sneezing,  nasal congestion or discharge of excess mucus or purulent secretions, ear ache,   fever, chills, sweats, unintended wt loss or wt gain, classically pleuritic or exertional cp,  orthopnea pnd or arm/hand swelling  or leg swelling, presyncope, palpitations, abdominal pain, anorexia, nausea, vomiting, diarrhea  or change in bowel habits or change in bladder habits, change in stools or change in urine, dysuria, hematuria,  rash, arthralgias, visual complaints, headache, numbness, weakness or ataxia or problems with walking or coordination,  change in mood or  memory.        Current Meds  Medication Sig   albuterol (PROVENTIL) (2.5 MG/3ML) 0.083% nebulizer solution Take 3 mLs (2.5 mg total) by nebulization every 4 (four) hours as needed for wheezing or shortness of breath.   atorvastatin (LIPITOR) 40 MG tablet TAKE ONE TABLET BY MOUTH ONCE DAILY.   budesonide (PULMICORT) 0.25 MG/2ML nebulizer solution One 0.25 mg  twice daily  along with albuterol   cetirizine (ZYRTEC) 10 MG tablet Take 10 mg by mouth daily.   dextromethorphan-guaiFENesin (MUCINEX DM) 30-600 MG 12hr tablet Take 1 tablet by mouth 2 (two) times daily.   diclofenac Sodium (VOLTAREN) 1 % GEL Apply 2 g topically 4 (four) times daily.   diltiazem (CARDIZEM CD) 180 MG 24 hr capsule Take 1 capsule (180 mg total) by mouth daily.   escitalopram (LEXAPRO) 10 MG tablet Take 10 mg by mouth daily.    famotidine (PEPCID) 20 MG tablet TAKE 1 TABLET AFTER SUPPER   fluticasone (FLONASE) 50 MCG/ACT nasal spray Place 1 spray into both nostrils daily as needed for  allergies.    furosemide (LASIX) 20 MG tablet TAKE (1) TABLET BY MOUTH ONCE DAILY FOR SWELLING.   gabapentin (NEURONTIN) 100 MG capsule TAKE 1 CAPSULE BY MOUTH FOUR TIMES DAILY.   HYDROcodone-acetaminophen (NORCO/VICODIN) 5-325 MG tablet One tablet every four hours as needed for acute pain.  Limit of five days per [place] statue.   levothyroxine (SYNTHROID) 100 MCG tablet Take 100 mcg by mouth daily before breakfast.   metFORMIN (GLUCOPHAGE) 500 MG tablet Take 1 tablet (500 mg total) by mouth 2 (two) times daily with a meal.   Multiple Vitamin (MULITIVITAMIN WITH MINERALS) TABS Take 1 tablet by mouth daily.   olmesartan (BENICAR) 5 MG tablet Take 5 mg by mouth daily.   ondansetron (ZOFRAN) 4 MG tablet Take 1 tablet (4 mg total) by mouth every 6 (six) hours as needed for nausea.   ondansetron (ZOFRAN-ODT) 4 MG disintegrating tablet '4mg'$   ODT q4 hours prn nausea/vomit   pantoprazole (PROTONIX) 40 MG tablet TAKE 1 TABLET 30 TO 60 MINUTES BEFORE FIRST MEAL OF THE DAY.   potassium chloride SA (K-DUR,KLOR-CON) 20 MEQ tablet Take 20 mEq by mouth daily.   PREVIDENT 5000 BOOSTER PLUS 1.1 % PSTE Place onto teeth.   warfarin (COUMADIN) 2.5 MG tablet TAKE AS DIRECTED BY COUMADIN CLINIC.             Past Medical History:  Diagnosis Date   Anemia    Atrial fibrillation ([place])    Breast carcinoma ([place]) 1995   9935   Cardioembolic stroke ([place]) 04/176   Right frontal in 01/2012; normal carotid ultrasound; possible LAA thrombus by TEE; virtual complete neurologic recovery   Chronic kidney disease, stage 2, mildly decreased GFR    GFR of approximately 60   Depression    Diverticulosis of colon (without mention of hemorrhage) 2012   Dr. Laural Golden   Fasting hyperglycemia    120 fasting   Gastroesophageal reflux disease    Gastroparesis    Hemorrhoids    Hyperlipidemia    Hypertension    pt denies 05/30/13     Dr Kathe Mariner   Hyponatremia    Rheumatic heart disease    a. 07/2013 Echo: Ef 55-60%,  Mod AS/AI, Mod-Sev MS, sev dil LA, PASP 58;  b. 07/2013 TEE: EF 35-40%, mild-mod AS/AI, mod-sev MS, LA smoke;  c. 07/2013 Cath: elev R heart pressures, nl cors.   Shortness of breath     Past Medical History:    Hx of NEOPLASM, MALIGNANT, BREAST (ICD-174.9)    HYPERLIPIDEMIA (ICD-272.4)    ASTHMA (ICD-493.90)    GERD with gastroparesis   Past Surgical History:    Breast surgery 1995    Heart valve surgery 1962 age 26 [facility] Mitral valve     Tubal ligation-1973   Objective:     Wt  12/13/2021         *** 09/14/2021      170  07/22/2021      162  10/29/2020        177 09/16/2020      178   08/05/20 178 lb 12.8 oz (81.1 kg)  06/25/20 194 lb (88 kg)  06/23/20 180 lb (81.6 kg)     Vital signs reviewed  12/13/2021  - Note at rest 02 sats  ***% on ***   General appearance:    amb wf lets her husband answer the questions    Lungs clear           Assessment

## 2021-12-13 NOTE — Patient Instructions (Addendum)
Plan A = Automatic = Always=   Albuterol 2.5 mg with budesonide 0.25 twice  daily at least 6 hours apart and walk your dog within an hour of using the nebulizer to see if it improves your breathing  - breath out thru the nose ? ?Plan B = Backup (to supplement plan A, not to replace it) ?Only use your albuterol nebulizer  as a rescue medication to be used if you can't catch your breath by resting or doing a relaxed purse lip breathing pattern.  ?- The less you use it, the better it will work when you need it. ?- Ok to use the inhaler up to every 4 hours if you must but call for appointment if use goes up over your usual need  ? ?Prednisone 10 mg take  4 each am x 2 days,   2 each am x 2 days,  1 each am x 2 days and stop  ? ? ?Please schedule a follow up office visit in 6 weeks, call sooner if needed with all medications /inhalers/ solutions in hand so we can verify exactly what you are taking. This includes all medications from all doctors and over the counters  ? ? ? ?  ?

## 2021-12-14 ENCOUNTER — Encounter: Payer: Self-pay | Admitting: Internal Medicine

## 2021-12-14 NOTE — Assessment & Plan Note (Addendum)
New complaint  @ ov 12/13/2021  >>  Walked on RA  x  3  lap(s) =  approx 450  ft  @ slow pace, stopped due to end of study/ sob with lowest 02 sats 93%  ? ?Suspect this is mostly conditioning with no significant evidence of airflow limited activity tolerance or noct asthma but not really understanding how / when to use saba related to sob: ? ?advised ?Re SABA :  I spent extra time with pt today reviewing appropriate use of albuterol for prn use on exertion with the following points: ?1) saba is for relief of sob that does not improve by walking a slower pace or resting but rather if the pt does not improve after trying this first. ?2) If the pt is convinced, as many are, that saba helps recover from activity faster then it's easy to tell if this is the case by re-challenging : ie stop, take the inhaler, then p 5 minutes try the exact same activity (intensity of workload) that just caused the symptoms and see if they are substantially diminished or not after saba ?3) if there is an activity that reproducibly causes the symptoms, try the saba 15 min before the activity on alternate days  ? ?If in fact the saba really does help, then fine to continue to use it prn but advised may need to look closer at the maintenance regimen being used to achieve better control of airways disease with exertion.  ?

## 2021-12-14 NOTE — Assessment & Plan Note (Signed)
Original onset 2010 on ACEi  ?Arlyce Harman 09/2012: normal  ?Sinus ct 2014:  No chronic sinus disease. ?10/16/12 Meth challenge  + for hyperreactive airways disease ?04/30/20 CT head sinus nl ?- 06/23/2020  After extensive coaching inhaler device,  effectiveness =    75% from a baseline of 25% > continue flovent 44 2bid and prn saba plus max gerd rx  ?08/05/2020    changed to symb 80 2bid and given depomedrol 120 mg IM  > no better ?- 09/16/2020  After extensive coaching inhaler device,  effectiveness =    50% baseline of less than 25% ?- 09/16/2020 started gabapentin titrate up to 100 mg qid  ?-  11/26/20  Sinus CT neg  ?- Allergy profile 07/22/2021 >  Eos 0.2 /  IgE  15 ?- 09/14/2021  After extensive coaching inhaler device,  effectiveness =    0% so changed to alb/bud bid > 12/13/2021 did not follow instructions so try again same rx  With  Prednisone 10 mg take  4 each am x 2 days,   2 each am x 2 days,  1 each am x 2 days and stop and bud 0.25 mg bid combined with albuterol since can't afford performist on her plan ? ?Return in 6 weeks with all meds in hand using a trust but verify approach to confirm accurate Medication  Reconciliation The principal here is that until we are certain that the  patients are doing what we've asked, it makes no sense to ask them to do more.  ? ?Each maintenance medication was reviewed in detail including emphasizing most importantly the difference between maintenance and prns and under what circumstances the prns are to be triggered using an action plan format where appropriate. ? ?Total time for H and P, chart review, counseling, reviewing neb device(s) , directly observing portions of ambulatory 02 saturation study/ and generating customized AVS unique to this office visit / same day charting = 35 min  ?     ?  ?      ?

## 2021-12-15 ENCOUNTER — Other Ambulatory Visit: Payer: Self-pay

## 2021-12-15 ENCOUNTER — Ambulatory Visit: Payer: Medicare PPO | Admitting: Orthopaedic Surgery

## 2021-12-15 ENCOUNTER — Encounter: Payer: Self-pay | Admitting: Orthopaedic Surgery

## 2021-12-15 VITALS — Ht 62.5 in | Wt 178.0 lb

## 2021-12-15 DIAGNOSIS — Z7901 Long term (current) use of anticoagulants: Secondary | ICD-10-CM | POA: Diagnosis not present

## 2021-12-15 DIAGNOSIS — M25561 Pain in right knee: Secondary | ICD-10-CM

## 2021-12-15 DIAGNOSIS — M25562 Pain in left knee: Secondary | ICD-10-CM | POA: Diagnosis not present

## 2021-12-15 NOTE — Progress Notes (Signed)
I feel better. ? ?Her knees are doing much better.  She has less swelling and less pain.  She is walking well without assistance now.  She has no new trauma.  Her hematomas have resolved.  She has some popping but no giving way of the knees. ? ?Both knees have slight effusion, crepitus, ROM of right is 0 to 110, left 0 to 105, stable, no distal edema. ? ?Encounter Diagnoses  ?Name Primary?  ? Acute pain of left knee Yes  ? Acute pain of right knee   ? Anticoagulated on Coumadin   ? ?I will see as needed. ? ?If she gets a problem, call. ? ?Call if any problem. ? ?Precautions discussed. ? ?Electronically Signed ?Sanjuana Kava, MD ?3/15/20239:41 AM ? ?

## 2021-12-16 ENCOUNTER — Ambulatory Visit (INDEPENDENT_AMBULATORY_CARE_PROVIDER_SITE_OTHER): Payer: Medicare PPO | Admitting: *Deleted

## 2021-12-16 DIAGNOSIS — I4891 Unspecified atrial fibrillation: Secondary | ICD-10-CM | POA: Diagnosis not present

## 2021-12-16 DIAGNOSIS — Z5181 Encounter for therapeutic drug level monitoring: Secondary | ICD-10-CM | POA: Diagnosis not present

## 2021-12-16 DIAGNOSIS — I639 Cerebral infarction, unspecified: Secondary | ICD-10-CM | POA: Diagnosis not present

## 2021-12-16 LAB — POCT INR: INR: 3.8 — AB (ref 2.0–3.0)

## 2021-12-16 NOTE — Patient Instructions (Signed)
Hold warfarin tonight then resume 1 tablet daily except for 1.5 tablets on Sundays, Tuesdays and Thursdays. Recheck INR in 3 weeks. ?

## 2022-01-05 ENCOUNTER — Ambulatory Visit (INDEPENDENT_AMBULATORY_CARE_PROVIDER_SITE_OTHER): Payer: Medicare PPO | Admitting: *Deleted

## 2022-01-05 DIAGNOSIS — I4891 Unspecified atrial fibrillation: Secondary | ICD-10-CM

## 2022-01-05 DIAGNOSIS — Z5181 Encounter for therapeutic drug level monitoring: Secondary | ICD-10-CM

## 2022-01-05 DIAGNOSIS — I639 Cerebral infarction, unspecified: Secondary | ICD-10-CM | POA: Diagnosis not present

## 2022-01-05 LAB — POCT INR: INR: 4 — AB (ref 2.0–3.0)

## 2022-01-05 NOTE — Patient Instructions (Signed)
Hold warfarin tonight then decrease dose to 1 tablet daily except for 1.5 tablets on Sundays and Thursdays. Recheck INR in 2 weeks. ?

## 2022-01-06 DIAGNOSIS — F32A Depression, unspecified: Secondary | ICD-10-CM | POA: Diagnosis not present

## 2022-01-06 DIAGNOSIS — I480 Paroxysmal atrial fibrillation: Secondary | ICD-10-CM | POA: Diagnosis not present

## 2022-01-06 DIAGNOSIS — N1831 Chronic kidney disease, stage 3a: Secondary | ICD-10-CM | POA: Diagnosis not present

## 2022-01-06 DIAGNOSIS — I639 Cerebral infarction, unspecified: Secondary | ICD-10-CM | POA: Diagnosis not present

## 2022-01-06 DIAGNOSIS — E785 Hyperlipidemia, unspecified: Secondary | ICD-10-CM | POA: Diagnosis not present

## 2022-01-06 DIAGNOSIS — I5032 Chronic diastolic (congestive) heart failure: Secondary | ICD-10-CM | POA: Diagnosis not present

## 2022-01-06 DIAGNOSIS — E039 Hypothyroidism, unspecified: Secondary | ICD-10-CM | POA: Diagnosis not present

## 2022-01-06 DIAGNOSIS — E1129 Type 2 diabetes mellitus with other diabetic kidney complication: Secondary | ICD-10-CM | POA: Diagnosis not present

## 2022-01-06 DIAGNOSIS — I700 Atherosclerosis of aorta: Secondary | ICD-10-CM | POA: Diagnosis not present

## 2022-01-10 ENCOUNTER — Other Ambulatory Visit: Payer: Self-pay | Admitting: Internal Medicine

## 2022-01-13 DIAGNOSIS — E1122 Type 2 diabetes mellitus with diabetic chronic kidney disease: Secondary | ICD-10-CM | POA: Diagnosis not present

## 2022-01-13 DIAGNOSIS — F325 Major depressive disorder, single episode, in full remission: Secondary | ICD-10-CM | POA: Diagnosis not present

## 2022-01-13 DIAGNOSIS — I4892 Unspecified atrial flutter: Secondary | ICD-10-CM | POA: Diagnosis not present

## 2022-01-13 DIAGNOSIS — E039 Hypothyroidism, unspecified: Secondary | ICD-10-CM | POA: Diagnosis not present

## 2022-01-13 DIAGNOSIS — D696 Thrombocytopenia, unspecified: Secondary | ICD-10-CM | POA: Diagnosis not present

## 2022-01-13 DIAGNOSIS — I700 Atherosclerosis of aorta: Secondary | ICD-10-CM | POA: Diagnosis not present

## 2022-01-13 DIAGNOSIS — Z6833 Body mass index (BMI) 33.0-33.9, adult: Secondary | ICD-10-CM | POA: Diagnosis not present

## 2022-01-13 DIAGNOSIS — I5030 Unspecified diastolic (congestive) heart failure: Secondary | ICD-10-CM | POA: Diagnosis not present

## 2022-01-13 DIAGNOSIS — N1831 Chronic kidney disease, stage 3a: Secondary | ICD-10-CM | POA: Diagnosis not present

## 2022-01-19 ENCOUNTER — Ambulatory Visit (INDEPENDENT_AMBULATORY_CARE_PROVIDER_SITE_OTHER): Payer: Medicare PPO | Admitting: *Deleted

## 2022-01-19 DIAGNOSIS — I4891 Unspecified atrial fibrillation: Secondary | ICD-10-CM | POA: Diagnosis not present

## 2022-01-19 DIAGNOSIS — Z5181 Encounter for therapeutic drug level monitoring: Secondary | ICD-10-CM

## 2022-01-19 DIAGNOSIS — I639 Cerebral infarction, unspecified: Secondary | ICD-10-CM

## 2022-01-19 LAB — POCT INR: INR: 2.5 (ref 2.0–3.0)

## 2022-01-19 NOTE — Patient Instructions (Signed)
Continue warfarin 1 tablet daily except for 1.5 tablets on Sundays and Thursdays. Recheck INR in 3 weeks. ?

## 2022-01-21 ENCOUNTER — Encounter: Payer: Self-pay | Admitting: Internal Medicine

## 2022-01-21 ENCOUNTER — Ambulatory Visit: Payer: Medicare PPO | Admitting: Internal Medicine

## 2022-01-21 DIAGNOSIS — R058 Other specified cough: Secondary | ICD-10-CM

## 2022-01-21 NOTE — Patient Instructions (Signed)
No change in medications  ? ?When symptoms flare go ahead and take the whole tube of budesonide = 0.5 mg until better then back to a half a tube  ? ? ?Please schedule a follow up visit in 3 months but call sooner if needed  ? ? ? ?

## 2022-01-21 NOTE — Assessment & Plan Note (Signed)
Original onset 2010 on ACEi  ?Arlyce Harman 09/2012: normal  ?Sinus ct 2014:  No chronic sinus disease. ?10/16/12 Meth challenge  + for hyperreactive airways disease ?04/30/20 CT head sinus nl ?- 06/23/2020  After extensive coaching inhaler device,  effectiveness =    75% from a baseline of 25% > continue flovent 44 2bid and prn saba plus max gerd rx  ?08/05/2020    changed to symb 80 2bid and given depomedrol 120 mg IM  > no better ?- 09/16/2020  After extensive coaching inhaler device,  effectiveness =    50% baseline of less than 25% ?- 09/16/2020 started gabapentin titrate up to 100 mg qid  ?-  11/26/20  Sinus CT neg  ?- Allergy profile 07/22/2021 >  Eos 0.2 /  IgE  15 ?- 09/14/2021  After extensive coaching inhaler device,  effectiveness =    0% so changed to alb/bud bid > 12/13/2021 did not follow instructions so try again same rx  With  Prednisone 10 mg take  4 each am x 2 days,   2 each am x 2 days,  1 each am x 2 days and stop and bud 0.25 mg bid combined with albuterol since can't afford performist on her plan. ? ?Has finally improved though wouldn't know it on initial interview of pt or husband  ? ?For mild flares assoc with seasonal rhinitis ok to double dose of budesonide until better and of course increase saba prn  ? ?    ?  ? ?Each maintenance medication was reviewed in detail including emphasizing most importantly the difference between maintenance and prns and under what circumstances the prns are to be triggered using an action plan format where appropriate. ? ?Total time for H and P, chart review, counseling, reviewing neb device(s) and generating customized AVS unique to this office visit / same day charting = 25 min  ?     ?

## 2022-01-21 NOTE — Progress Notes (Signed)
? ?[name], female    DOB: 1946-02-15      MRN: [id number] ? ? ?Brief patient profile:  ?26  yowf never smoker with a h/o sob related to valvular heart dz  ? ? ?Seen prior to Epic in [place]: ?Feb 20, 2009 initial pulmonary eval started aerobics 3 months prior to OV  X 30 min weekly and tolerated well with improving ex tol until 3 weeks prior to OV   noted decrease tolerance and since then gradually worse to point where walking to mailbox slt uphill or climbing steps has to stop at top.  In retrospect coughing for 3 months dry, worse immediately on lying down. due to the cough lisnopril was stopped and could not tolerate the substitute and cough hasn't changed but breathing got worse. prednisone no help so far and proaire no benefit. ?rec ?GERD diet   ?continue prilosec and reglan ?stop prednisone and proaire ? avoid salt  maxzide 25 one each am and Dr Izell [facility] office  ? ?10/16/12 Meth challenge  + for hyperreactive airways disease ? ?clance eval 05/14/13 ?[name] that at this point is clearly upper airway in origin.  [name], but her current cough failed to respond to prednisone tapers.  Therefore it is unlikely that it is related to cough variant asthma.  She does not have chronic sinusitis by her CT scan, and there is nothing on chest x-ray earlier in the year to explain her cough.  She does have a history of breast cancer, but there has been no evidence for recurrence.  [name], but this has an extremely low yield.  [name], and therefore I am concerned she may be having laryngopharyngeal reflux as a cause for her cough.  I also think she may have a cyclical mechanism as well she did have some response to tramadol, and we could consider a trial of gabapentin ? ?AVR  07/18/13  ? ?04/2020 echo ? 1. Left ventricular ejection fraction, by estimation, is 60 to 65%. The   ?left ventricle has normal function. The left ventricle has no regional  ?wall motion abnormalities. Left ventricular diastolic parameters are  ?indeterminate.  ? 2. Right ventricular systolic function is low normal. The right  ?ventricular size is normal. There is moderately elevated pulmonary artery  ?systolic pressure. The estimated right ventricular systolic pressure is  ?03.5 mmHg.  ? 3. Left atrial size was mildly dilated.  ? 4. The mitral valve has been repaired/replaced. No evidence of mitral  ?valve regurgitation. The mean mitral valve gradient is 5.0 mmHg. There is  ?a 25 mm Magna Ease bioprosthetic valve present in the mitral position.  ?Grossly normal function.  ? 5. The aortic valve has been repaired/replaced. Aortic valve  ?regurgitation is not visualized. There is a 21 mm Magna Ease bioprosthetic  ?valve present in the aortic position. Aortic valve mean gradient measures  ?7.0 mmHg. Grossly normal function.  ? 6. The inferior vena cava is normal in size with greater than 50%  ?respiratory variability, suggesting right atrial pressure of 3 mmHg. ?  ? ? ? ? ? ?History of Present Illness  ?06/23/2020  Pulmonary/ 1st office eval/ [facility] / [facility] / re establish re cough and sob on flovent 44 2bid  For cough variant asthma  ?Chief Complaint  ?Patient presents with  ? Consult  ?  Patient has  a dry cough that has been going on for many years but worse over the last 6 months. Shortness of breath with exertion.   ?Dyspnea:  Walks dog x 30 min / mostly flat uses cane to balance/ steps ok slowly due to breathing problems x sev years prior to OV   ?Cough: worse in afternoons and sometimes hs has to sit up 30 degrees to prevent cough supine  ?Sleep: ok as long as head is up 30 degrees new x 2 years  ?SABA use: sev times a day seems to help at 4 pm ?rec ?GERD diet  ?Pantoprazole (protonix) 40 mg   Take  30-60 min before first meal of the day and Pepcid (famotidine)  20 mg one after supper until return to  office - this is the best way to tell whether stomach acid is contributing to your problem.   ?Plan A = Automatic = Always=    flovent Take 2 puffs first thing in am and then another 2 puffs about 12 hours later.  ?Work on inhaler technique:  ?Plan B = Backup (to supplement plan A, not to replace it) ?Only use your albuterol inhaler as a rescue medication   ?Please schedule a follow up office visit in 6 weeks, call sooner if needed - bring inhalers  ?Next step ? Gabapentin? ? ? ?08/05/2020  f/u ov/[place] office/Charlestine Rookstool re: cough > sob x years  ?Chief Complaint  ?Patient presents with  ? Follow-up  ?  non productive cough   ?Dyspnea:  Not limited by breathing from desired activities  / plays golf/ walks dog  ?Cough: dry ? Worse with activity (husband says not reproducible ?  worse p supper  ?Sleeping: most nights waking up  ?SABA use: once a day  ?02: none  ?rec ?Depomedrol 120 mg IM  ?Plan A = Automatic = Always=    symbicort 80 Take 2 puffs first thing in am and then another 2 puffs about 12 hours later.  ?Work on inhaler technique:   ?  Plan B = Backup (to supplement plan A, not to replace it) ?Only use your albuterol inhaler as a rescue medication   ?Please schedule a follow up office visit in 6 weeks, call sooner if needed  - bring inhalers and the empty cannister of symbicort  ? ? ?09/16/2020  f/u ov/[place] office/Desiree Fleming re: cough  x years ? Decades "nothing has ever helped" no better p depo last ov, not ever transiently  ?Chief Complaint  ?Patient presents with  ? Follow-up  ?  Cough is unchanged since the last visit- prod with white sputum.  She is using her albuterol inhaler once per day usually around 4-5 pm.   ?Dyspnea:  Walks dog, uses cane / some balance issues limiting more than sob and still plays golf ?Cough: worse  p supper despite pepcid ?Sleeping: does better in recliner 30 degrees  ?SABA use: once a day around 5pm seems to help though note hfa very poor baseline  ?02: none ?Work on inhaler  technique:   ?Gabapentin 100 mg twice a day for a week then 4 x daily  ?Please remember to go to the  x-ray department  @  [facility] for your tests - we will call you with the results when they are available    ?Add Needs f/u cxr  ? rml syndrome p rx mucinex dm > then ct chest/ sinus if not better  and check allergy profile on return   ? ? ? ?  10/29/2020  f/u ov/[place] office/Matilda Fleig re: chronic cough  ?Chief Complaint  ?Patient presents with  ? Followup   ?  Cough is unchanged since the last visit. She has used her albuterol inhaler twice since her last visit.   ?Dyspnea:  Slowed more balance than breathing, walks with suction cup cane ?Cough: dry cough day > noct ?Sleeping: able to sleep sitting up 30 degrees s sob or cough  ?SABA use: rare ?02: none  ?Rec ?We will call to schedule you CT sinus (neg) and chest = ? Scarring from Breast RT (note surgery/RT in early 90's and cough started p that  ? ? ? ? ?07/22/2021  f/u ov/Aeisha Minarik re: cough > sob    maint on flovent  for dx of MCT pos cough variant asthma / stopped gabapentin ? Why ? ?Chief Complaint  ?Patient presents with  ? Follow-up  ?  Productive cough with thick white mucus  ? Dyspnea:  mailbox and back is 200 yards slow pace ?Cough: daytime cough / lots of throat clearing  ?Sleeping: 30 degrees ok  ?SABA use: using saba bid/ poor flovent use  ?02: none  ?Covid status:   vax x 5  ?Rec ?Pantoprazole (protonix) 40 mg   Take  30-60 min before first meal of the day and Pepcid (famotidine)  20 mg after supper until return to office   ?Gabapentin 100 mg four times daily  ?Plan A = Automatic = Always=   Symbicort 80 mg  Take 2 puffs first thing in am and then another 2 puffs about 12 hours later. (Stop flovent and atrovent- unless can't afford symbicort on your plan) ?Plan B = Backup (to supplement plan A, not to replace it) ?Only use your albuterol inhaler as a rescue medication   ?Plan C = Crisis (instead of Plan B but only if Plan B stops working) ?- only  use your albuterol nebulizer if you first try Plan B   ?For cough > mucinex dm 1200 every 12 hours as needed  ? ? ?09/14/2021  f/u ov/[place] office/Jailen Lung re: uacs vs asthma maint on symb  80 2bid  ?Chief Complai

## 2022-02-07 ENCOUNTER — Other Ambulatory Visit: Payer: Self-pay | Admitting: Internal Medicine

## 2022-02-09 ENCOUNTER — Ambulatory Visit (INDEPENDENT_AMBULATORY_CARE_PROVIDER_SITE_OTHER): Payer: Medicare PPO | Admitting: *Deleted

## 2022-02-09 DIAGNOSIS — Z5181 Encounter for therapeutic drug level monitoring: Secondary | ICD-10-CM

## 2022-02-09 DIAGNOSIS — I639 Cerebral infarction, unspecified: Secondary | ICD-10-CM | POA: Diagnosis not present

## 2022-02-09 DIAGNOSIS — Z952 Presence of prosthetic heart valve: Secondary | ICD-10-CM

## 2022-02-09 DIAGNOSIS — I4891 Unspecified atrial fibrillation: Secondary | ICD-10-CM

## 2022-02-09 LAB — POCT INR: INR: 2.9 (ref 2.0–3.0)

## 2022-02-09 NOTE — Patient Instructions (Signed)
Continue warfarin 1 tablet daily except for 1.5 tablets on Sundays and Thursdays. Recheck INR in 4 weeks. 

## 2022-02-22 ENCOUNTER — Other Ambulatory Visit: Payer: Self-pay | Admitting: Internal Medicine

## 2022-02-24 ENCOUNTER — Other Ambulatory Visit: Payer: Self-pay

## 2022-02-24 ENCOUNTER — Emergency Department (HOSPITAL_COMMUNITY): Payer: Medicare PPO

## 2022-02-24 ENCOUNTER — Emergency Department (HOSPITAL_COMMUNITY)
Admission: EM | Admit: 2022-02-24 | Discharge: 2022-02-24 | Disposition: A | Discharge status: Home / Self Care | Payer: Medicare PPO | Attending: Emergency Medicine | Admitting: Emergency Medicine

## 2022-02-24 ENCOUNTER — Encounter (HOSPITAL_COMMUNITY): Payer: Self-pay | Admitting: Emergency Medicine

## 2022-02-24 DIAGNOSIS — S0990XA Unspecified injury of head, initial encounter: Secondary | ICD-10-CM | POA: Diagnosis not present

## 2022-02-24 DIAGNOSIS — Z7951 Long term (current) use of inhaled steroids: Secondary | ICD-10-CM | POA: Diagnosis not present

## 2022-02-24 DIAGNOSIS — W19XXXA Unspecified fall, initial encounter: Secondary | ICD-10-CM | POA: Diagnosis not present

## 2022-02-24 DIAGNOSIS — S60512A Abrasion of left hand, initial encounter: Secondary | ICD-10-CM | POA: Insufficient documentation

## 2022-02-24 DIAGNOSIS — N182 Chronic kidney disease, stage 2 (mild): Secondary | ICD-10-CM | POA: Insufficient documentation

## 2022-02-24 DIAGNOSIS — Z7901 Long term (current) use of anticoagulants: Secondary | ICD-10-CM | POA: Insufficient documentation

## 2022-02-24 DIAGNOSIS — R918 Other nonspecific abnormal finding of lung field: Secondary | ICD-10-CM | POA: Diagnosis not present

## 2022-02-24 DIAGNOSIS — S60411A Abrasion of left index finger, initial encounter: Secondary | ICD-10-CM | POA: Diagnosis not present

## 2022-02-24 DIAGNOSIS — Y92009 Unspecified place in unspecified non-institutional (private) residence as the place of occurrence of the external cause: Secondary | ICD-10-CM | POA: Diagnosis not present

## 2022-02-24 DIAGNOSIS — Y9301 Activity, walking, marching and hiking: Secondary | ICD-10-CM | POA: Insufficient documentation

## 2022-02-24 DIAGNOSIS — W108XXA Fall (on) (from) other stairs and steps, initial encounter: Secondary | ICD-10-CM | POA: Diagnosis not present

## 2022-02-24 DIAGNOSIS — Z952 Presence of prosthetic heart valve: Secondary | ICD-10-CM | POA: Diagnosis not present

## 2022-02-24 DIAGNOSIS — S0101XA Laceration without foreign body of scalp, initial encounter: Secondary | ICD-10-CM

## 2022-02-24 DIAGNOSIS — S0003XA Contusion of scalp, initial encounter: Secondary | ICD-10-CM

## 2022-02-24 DIAGNOSIS — J849 Interstitial pulmonary disease, unspecified: Secondary | ICD-10-CM | POA: Diagnosis not present

## 2022-02-24 DIAGNOSIS — S199XXA Unspecified injury of neck, initial encounter: Secondary | ICD-10-CM | POA: Diagnosis not present

## 2022-02-24 DIAGNOSIS — R52 Pain, unspecified: Secondary | ICD-10-CM | POA: Diagnosis not present

## 2022-02-24 DIAGNOSIS — J45909 Unspecified asthma, uncomplicated: Secondary | ICD-10-CM | POA: Insufficient documentation

## 2022-02-24 DIAGNOSIS — G9389 Other specified disorders of brain: Secondary | ICD-10-CM | POA: Diagnosis not present

## 2022-02-24 DIAGNOSIS — I639 Cerebral infarction, unspecified: Secondary | ICD-10-CM | POA: Diagnosis not present

## 2022-02-24 DIAGNOSIS — I517 Cardiomegaly: Secondary | ICD-10-CM | POA: Diagnosis not present

## 2022-02-24 DIAGNOSIS — S60419A Abrasion of unspecified finger, initial encounter: Secondary | ICD-10-CM

## 2022-02-24 MED ORDER — ONDANSETRON 4 MG PO TBDP
4.0000 mg | ORAL_TABLET | Freq: Once | ORAL | Status: AC
  Administered 2022-02-24: 4 mg via ORAL
  Filled 2022-02-24: qty 1

## 2022-02-24 MED ORDER — ONDANSETRON HCL 4 MG PO TABS: 4.0000 mg | ORAL_TABLET | Freq: Three times a day (TID) | ORAL | 0 refills | Status: DC | PRN

## 2022-02-24 NOTE — Discharge Instructions (Addendum)
You were seen today due to a laceration of the scalp.  This was repaired with a staple.  You did have this removed in approximately 1 week by any healthcare provider.  I did provide information on concussion in case you develop worsening symptoms.  I also provided nausea medication to be used as needed.  Your CT scans of both the head and cervical spine were reassuring for no acute process today.  Recommend follow-up with primary care provider for ongoing concerns.

## 2022-02-24 NOTE — ED Notes (Signed)
Patient transported to CT 

## 2022-02-24 NOTE — ED Triage Notes (Addendum)
Pt to the ED via [facility] after a fall at home. The pt was walking up the front steps and fell backwards and hit her head on the pavement.  Pt has a small laceration on her posterior head bleeding controlled with swelling noted. She also has an abrasion/laceration to her right hand.  Pt does take Coumadin 2.'5mg'$ .  C collar placed by EMS.

## 2022-02-24 NOTE — ED Provider Notes (Signed)
[facility] EMERGENCY DEPARTMENT Provider Note   CSN: [id number] Arrival date & time: 02/24/22  1614     History  Chief Complaint  Patient presents with   [name]    [name] is a 76 y.o. female.  Patient presents to the emergency department via EMS after a fall at home.  Patient was walking up the front steps when she slipped and fell backwards hitting her head on the pavement.  Patient's chief complaints are a laceration on the posterior of her head.  Bleeding controlled with swelling noted upon arrival.  Small abrasion noted to the fingers on the left hand.  The patient does take 2.5 milligrams of Coumadin daily.  Patient has a past medical history significant for rheumatic heart disease, hyperlipidemia, shortness of breath, A-fib, stage II CKD, asthma, GERD  HPI     Home Medications Prior to Admission medications   Medication Sig Start Date End Date Taking? Authorizing Provider  ondansetron (ZOFRAN) 4 MG tablet Take 1 tablet (4 mg total) by mouth every 8 (eight) hours as needed for nausea or vomiting. 02/24/22  Yes Cherlynn June B, PA-C  albuterol (PROVENTIL) (2.5 MG/3ML) 0.083% nebulizer solution Take 3 mLs (2.5 mg total) by nebulization every 4 (four) hours as needed for wheezing or shortness of breath. 07/22/21   Tanda Rockers, MD  atorvastatin (LIPITOR) 40 MG tablet TAKE ONE TABLET BY MOUTH ONCE DAILY. 10/18/21   Arnoldo Lenis, MD  budesonide (PULMICORT) 0.25 MG/2ML nebulizer solution One 0.25 mg  twice daily  along with albuterol 12/13/21   Tanda Rockers, MD  cetirizine (ZYRTEC) 10 MG tablet Take 10 mg by mouth daily.    [provider]  dextromethorphan-guaiFENesin (MUCINEX DM) 30-600 MG 12hr tablet Take 1 tablet by mouth 2 (two) times daily.    [provider]  diclofenac Sodium (VOLTAREN) 1 % GEL Apply 2 g topically 4 (four) times daily. 10/26/21   Volney American, PA-C  diltiazem (CARDIZEM CD) 180 MG 24 hr capsule Take 1 capsule (180 mg  total) by mouth daily. 08/03/13   Collins, Gina L, PA-C  escitalopram (LEXAPRO) 10 MG tablet Take 10 mg by mouth daily.  04/10/15   [provider]  famotidine (PEPCID) 20 MG tablet TAKE 1 TABLET AFTER SUPPER 09/20/21   Tanda Rockers, MD  fluticasone [facility]) 50 MCG/ACT nasal spray Place 1 spray into both nostrils daily as needed for allergies.  09/05/13   Arnoldo Lenis, MD  furosemide (LASIX) 20 MG tablet TAKE (1) TABLET BY MOUTH ONCE DAILY FOR SWELLING. 08/30/21   Arnoldo Lenis, MD  gabapentin (NEURONTIN) 100 MG capsule TAKE 1 CAPSULE BY MOUTH FOUR TIMES DAILY. 02/22/22   Tanda Rockers, MD  levothyroxine (SYNTHROID) 100 MCG tablet Take 100 mcg by mouth daily before breakfast.    [provider]  metFORMIN (GLUCOPHAGE) 500 MG tablet Take 1 tablet (500 mg total) by mouth 2 (two) times daily with a meal. 08/03/13   Collins, Gina L, PA-C  metoprolol tartrate (LOPRESSOR) 25 MG tablet Take 1.5 tablets (37.5 mg total) by mouth 2 (two) times daily. 08/13/21 11/11/21  Arnoldo Lenis, MD  Multiple Vitamin (MULITIVITAMIN WITH MINERALS) TABS Take 1 tablet by mouth daily.    [provider]  olmesartan (BENICAR) 5 MG tablet Take 5 mg by mouth daily.    [provider]  ondansetron (ZOFRAN) 4 MG tablet Take 1 tablet (4 mg total) by mouth every 6 (six) hours as needed  for nausea. 06/04/21   Allie Bossier, MD  ondansetron (ZOFRAN-ODT) 4 MG disintegrating tablet '4mg'$  ODT q4 hours prn nausea/vomit 11/11/21   Mesner, Corene Cornea, MD  pantoprazole (PROTONIX) 40 MG tablet TAKE 1 TABLET 30 TO 60 MINUTES BEFORE FIRST MEAL OF THE DAY. 02/07/22   Tanda Rockers, MD  potassium chloride SA (K-DUR,KLOR-CON) 20 MEQ tablet Take 20 mEq by mouth daily. 08/14/13   Gaye Pollack, MD  predniSONE (DELTASONE) 10 MG tablet Take  4 each am x 2 days,   2 each am x 2 days,  1 each am x 2 days and stop 12/13/21   Tanda Rockers, MD  PREVIDENT 5000 BOOSTER PLUS 1.1 % PSTE Place onto teeth. 05/28/21    [provider]  warfarin (COUMADIN) 2.5 MG tablet TAKE AS DIRECTED BY COUMADIN CLINIC. 04/21/21   Arnoldo Lenis, MD      Allergies    Amiodarone    Review of Systems   Review of Systems  Respiratory:  Negative for shortness of breath.   Gastrointestinal:  Positive for nausea. Negative for vomiting.  Neurological:  Positive for headaches. Negative for dizziness, syncope and light-headedness.   Physical Exam Updated Vital Signs BP (!) 142/86 (BP Location: Left Arm)   Pulse 100   Temp 99 F (37.2 C) (Oral)   Resp 16   Ht '5\' 2"'$  (1.575 m)   Wt 79.4 kg   SpO2 96%   BMI 32.01 kg/m  Physical Exam Vitals and nursing note reviewed.  Constitutional:      General: She is not in acute distress. HENT:     Head: Normocephalic.     Nose: Nose normal.     Mouth/Throat:     Mouth: Mucous membranes are moist.  Eyes:     Extraocular Movements: Extraocular movements intact.     Conjunctiva/sclera: Conjunctivae normal.     Pupils: Pupils are equal, round, and reactive to light.  Cardiovascular:     Rate and Rhythm: Normal rate.  Pulmonary:     Effort: Pulmonary effort is normal.  Skin:    Comments: Hematoma with 0.5 cm laceration noted on posterior skull.  Neurological:     Mental Status: She is alert.     Sensory: Sensation is intact.     Motor: Motor function is intact.     Coordination: Coordination is intact.     Comments: CN II through VII, XI, XII grossly intact    ED Results / Procedures / Treatments   Labs (all labs ordered are listed, but only abnormal results are displayed) Labs Reviewed - No data to display  EKG None  Radiology DG Chest 2 View  Result Date: 02/24/2022 CLINICAL DATA:  Evaluate for pleural effusion. EXAM: CHEST - 2 VIEW COMPARISON:  CT angio chest 06/01/2021 FINDINGS: Mild cardiac enlargement. Previous median sternotomy and mitral valve and aortic valve replacement. Unchanged appearance of the previously noted thickened linear opacity  on the lateral chest radiograph which is favored to represent loculated pleural fluid along the major fissure of the right lung. This is stable when compared with previous exam. Chronic reticular and nodular interstitial opacities are identified bilaterally and also appear unchanged. No superimposed airspace consolidation. IMPRESSION: 1. No change in appearance of loculated pleural fluid along the major fissure of the right lung. 2. Stable chronic interstitial pulmonary changes. Electronically Signed   By: Kerby Moors M.D.   On: 02/24/2022 19:09   CT Head Wo Contrast  Result Date: 02/24/2022 CLINICAL DATA:  Head trauma, moderate-severe Pt fell on the pavement striking the back of her head. Pt does take coumadin. EXAM: CT HEAD WITHOUT CONTRAST CT CERVICAL SPINE WITHOUT CONTRAST TECHNIQUE: Multidetector CT imaging of the head and cervical spine was performed following the standard protocol without intravenous contrast. Multiplanar CT image reconstructions of the cervical spine were also generated. RADIATION DOSE REDUCTION: This exam was performed according to the departmental dose-optimization program which includes automated exposure control, adjustment of the mA and/or kV according to patient size and/or use of iterative reconstruction technique. COMPARISON:  CT of the chest from 06/01/2021. CT head November 12, 2016. FINDINGS: CT HEAD FINDINGS Brain: No evidence of acute large vascular territory infarction, hemorrhage, hydrocephalus, extra-axial collection or mass lesion/mass effect. Remote right frontal infarct With encephalomalacia. Remote left cerebellar infarcts. Chronic microvascular ischemic disease. Vascular: Calcific intracranial atherosclerosis. No hyperdense vessel identified. Skull: High right posterior scalp contusion/laceration without acute calvarial fracture. Sinuses/Orbits: Clear sinuses.  No acute orbital findings. Other: No mastoid effusions. CT CERVICAL SPINE FINDINGS Alignment: Mild  anterolisthesis of C4 on C5, favored degenerative in etiology given facet arthropathy at this level. Otherwise, no substantial sagittal subluxation. Straightening of the cervical lordosis. Skull base and vertebrae: Mild superior endplate sclerosis and height loss of the T2 vertebral body is unchanged when compared to CT of the chest from 06/01/2021. Small associated degenerative Schmorl's node. No evidence of new vertebral body height loss or acute fracture. Soft tissues and spinal canal: No prevertebral fluid or swelling. No visible canal hematoma. Disc levels: Moderate multilevel degenerative disease and facet arthropathy. Upper chest: Mild interlobular septal thickening in the visualized lung apices. Layering left and possible right pleural effusions. IMPRESSION: CT head: 1. No evidence of acute intracranial abnormality. 2. High right posterior scalp contusion/laceration without acute calvarial fracture. 3. Remote right frontal and left cerebellar infarcts. CT cervical spine: 1. No evidence of acute fracture or traumatic malalignment. 2. Mild interlobular septal thickening in the visualized lung apices, suggestive of interstitial edema. Layering left and possible right pleural effusions. Recommend correlation with dedicated chest imaging. 3. Mild superior endplate sclerosis and height loss of the T2 vertebral body is unchanged when compared to CT of the chest from 06/01/2021. This could be secondary to degenerative change and/or prior fracture. Electronically Signed   By: Margaretha Sheffield M.D.   On: 02/24/2022 17:16   CT CERVICAL SPINE WO CONTRAST  Result Date: 02/24/2022 CLINICAL DATA:  Head trauma, moderate-severe Pt fell on the pavement striking the back of her head. Pt does take coumadin. EXAM: CT HEAD WITHOUT CONTRAST CT CERVICAL SPINE WITHOUT CONTRAST TECHNIQUE: Multidetector CT imaging of the head and cervical spine was performed following the standard protocol without intravenous contrast.  Multiplanar CT image reconstructions of the cervical spine were also generated. RADIATION DOSE REDUCTION: This exam was performed according to the departmental dose-optimization program which includes automated exposure control, adjustment of the mA and/or kV according to patient size and/or use of iterative reconstruction technique. COMPARISON:  CT of the chest from 06/01/2021. CT head November 12, 2016. FINDINGS: CT HEAD FINDINGS Brain: No evidence of acute large vascular territory infarction, hemorrhage, hydrocephalus, extra-axial collection or mass lesion/mass effect. Remote right frontal infarct With encephalomalacia. Remote left cerebellar infarcts. Chronic microvascular ischemic disease. Vascular: Calcific intracranial atherosclerosis. No hyperdense vessel identified. Skull: High right posterior scalp contusion/laceration without acute calvarial fracture. Sinuses/Orbits: Clear sinuses.  No acute orbital findings. Other: No mastoid effusions. CT CERVICAL SPINE FINDINGS Alignment: Mild anterolisthesis of C4 on C5, favored degenerative in  etiology given facet arthropathy at this level. Otherwise, no substantial sagittal subluxation. Straightening of the cervical lordosis. Skull base and vertebrae: Mild superior endplate sclerosis and height loss of the T2 vertebral body is unchanged when compared to CT of the chest from 06/01/2021. Small associated degenerative Schmorl's node. No evidence of new vertebral body height loss or acute fracture. Soft tissues and spinal canal: No prevertebral fluid or swelling. No visible canal hematoma. Disc levels: Moderate multilevel degenerative disease and facet arthropathy. Upper chest: Mild interlobular septal thickening in the visualized lung apices. Layering left and possible right pleural effusions. IMPRESSION: CT head: 1. No evidence of acute intracranial abnormality. 2. High right posterior scalp contusion/laceration without acute calvarial fracture. 3. Remote right  frontal and left cerebellar infarcts. CT cervical spine: 1. No evidence of acute fracture or traumatic malalignment. 2. Mild interlobular septal thickening in the visualized lung apices, suggestive of interstitial edema. Layering left and possible right pleural effusions. Recommend correlation with dedicated chest imaging. 3. Mild superior endplate sclerosis and height loss of the T2 vertebral body is unchanged when compared to CT of the chest from 06/01/2021. This could be secondary to degenerative change and/or prior fracture. Electronically Signed   By: Margaretha Sheffield M.D.   On: 02/24/2022 17:16    Procedures .Marland KitchenLaceration Repair  Date/Time: 02/24/2022 6:35 PM Performed by: Dorothyann Peng, PA-C Authorized by: Dorothyann Peng, PA-C   Consent:    Consent obtained:  Verbal   Consent given by:  Patient   Risks discussed:  Infection, need for additional repair, pain, poor cosmetic result and poor wound healing   Alternatives discussed:  No treatment and delayed treatment Universal protocol:    Procedure explained and questions answered to patient or proxy's satisfaction: yes     Relevant documents present and verified: yes     Imaging studies available: yes     Required blood products, implants, devices, and special equipment available: yes     Immediately prior to procedure, a time out was called: yes     Patient identity confirmed:  Verbally with patient Anesthesia:    Anesthesia method:  None Laceration details:    Location:  Scalp   Scalp location:  Occipital   Length (cm):  0.5   Depth (mm):  3 Exploration:    Hemostasis achieved with:  Direct pressure Treatment:    Area cleansed with:  Saline   Amount of cleaning:  Standard Skin repair:    Repair method:  Staples   Number of staples:  1 Approximation:    Approximation:  Close Repair type:    Repair type:  Simple Post-procedure details:    Dressing:  Open (no dressing)   Procedure completion:  Tolerated well, no  immediate complications    Medications Ordered in ED Medications  ondansetron (ZOFRAN-ODT) disintegrating tablet 4 mg (4 mg Oral Given 02/24/22 1841)    ED Course/ Medical Decision Making/ A&P                           Medical Decision Making Amount and/or Complexity of Data Reviewed Radiology: ordered.  Risk Prescription drug management.   This patient presents to the ED for concern of head trauma, this involves an extensive number of treatment options, and is a complaint that carries with it a high risk of complications and morbidity.  The differential diagnosis includes intracranial hemorrhage, fracture, cervical spine fracture, and others   Co morbidities that complicate the patient  evaluation  Coumadin usage   Additional history obtained:  Additional history obtained from EMS   Imaging Studies ordered:  I ordered imaging studies including CT head and CT cervical spine.  Chest x-ray ordered after CT cervical spine findings. I independently visualized and interpreted imaging which showed no evidence of acute intracranial abnormality, high right posterior scalp contusion/laceration without acute calvarial fracture, remote right frontal and left cerebellar infarcts.  No evidence of acute fracture or traumatic malalignment in the cervical spine.  Mild interlobular septal thickening in the visualized lung apices, suggestive of interstitial edema.  Layering left and possible right pleural effusions.  Superior endplate sclerosis with height loss of the T2 vertebral body is unchanged when compared to CT of the chest from 06/01/2021.  Chest x-ray shows no change in appearance of loculated pleural fluid along the major fissure of the right lung.  Stable chronic interstitial pulmonary changes. I agree with the radiologist interpretation   Problem List / ED Course / Critical interventions / Medication management   I ordered medication including Zofran for nausea Reevaluation of the  patient after these medicines showed that the patient improved I have reviewed the patients home medicines and have made adjustments as needed    Test / Admission - Considered:  The patient had no acute intracranial or cervical spine abnormality on imaging.  Her laceration was repaired with a staple as noted above.  She did have some mild nausea which is consistent with a mild concussion.  I provided education on concussion to the patient.  I also provided a prescription for Zofran for nausea as needed.  There is no indication at this time for admission.  Discharge home  Final Clinical Impression(s) / ED Diagnoses Final diagnoses:  Laceration of scalp, initial encounter  Hematoma of scalp, initial encounter  Abrasion of finger, initial encounter    Rx / DC Orders ED Discharge Orders          Ordered    ondansetron (ZOFRAN) 4 MG tablet  Every 8 hours PRN        02/24/22 1843              Ronny Bacon 02/24/22 1919    Fredia Sorrow, MD 03/11/22 1534

## 2022-02-24 NOTE — ED Notes (Signed)
Patient transported to X-ray 

## 2022-03-02 DIAGNOSIS — S0191XA Laceration without foreign body of unspecified part of head, initial encounter: Secondary | ICD-10-CM | POA: Diagnosis not present

## 2022-03-09 ENCOUNTER — Other Ambulatory Visit: Payer: Self-pay | Admitting: Cardiology

## 2022-03-09 ENCOUNTER — Ambulatory Visit (INDEPENDENT_AMBULATORY_CARE_PROVIDER_SITE_OTHER): Payer: Medicare PPO | Admitting: *Deleted

## 2022-03-09 DIAGNOSIS — Z5181 Encounter for therapeutic drug level monitoring: Secondary | ICD-10-CM | POA: Diagnosis not present

## 2022-03-09 DIAGNOSIS — I639 Cerebral infarction, unspecified: Secondary | ICD-10-CM

## 2022-03-09 DIAGNOSIS — I4891 Unspecified atrial fibrillation: Secondary | ICD-10-CM

## 2022-03-09 LAB — POCT INR: INR: 3.9 — AB (ref 2.0–3.0)

## 2022-03-09 NOTE — Patient Instructions (Signed)
Hold warfarin tonight then resume 1 tablet daily except for 1.5 tablets on Sundays and Thursdays. Recheck INR in 4 weeks.

## 2022-03-12 ENCOUNTER — Emergency Department (HOSPITAL_COMMUNITY): Payer: Medicare PPO

## 2022-03-12 ENCOUNTER — Other Ambulatory Visit: Payer: Self-pay

## 2022-03-12 ENCOUNTER — Encounter (HOSPITAL_COMMUNITY): Payer: Self-pay | Admitting: Emergency Medicine

## 2022-03-12 ENCOUNTER — Emergency Department (HOSPITAL_COMMUNITY)
Admission: EM | Admit: 2022-03-12 | Discharge: 2022-03-13 | Disposition: A | Discharge status: Home / Self Care | Payer: Medicare PPO | Attending: Emergency Medicine | Admitting: Emergency Medicine

## 2022-03-12 DIAGNOSIS — S0990XA Unspecified injury of head, initial encounter: Secondary | ICD-10-CM | POA: Diagnosis present

## 2022-03-12 DIAGNOSIS — Z7901 Long term (current) use of anticoagulants: Secondary | ICD-10-CM | POA: Insufficient documentation

## 2022-03-12 DIAGNOSIS — I4891 Unspecified atrial fibrillation: Secondary | ICD-10-CM | POA: Diagnosis not present

## 2022-03-12 DIAGNOSIS — Z79899 Other long term (current) drug therapy: Secondary | ICD-10-CM | POA: Diagnosis not present

## 2022-03-12 DIAGNOSIS — S0003XA Contusion of scalp, initial encounter: Secondary | ICD-10-CM | POA: Insufficient documentation

## 2022-03-12 DIAGNOSIS — W1839XA Other fall on same level, initial encounter: Secondary | ICD-10-CM | POA: Diagnosis not present

## 2022-03-12 NOTE — ED Triage Notes (Addendum)
Pt brought in by husband after area on scalp that had previously been stapled as a result of a fall (1 staple removed a week ago) started bleeding again. Husband states looked like pt "bled 3 gallons" and "ruined 2 pillowcases". Bleeding appears minimal at this time. Pt is on Coumadin.

## 2022-03-13 DIAGNOSIS — S0003XA Contusion of scalp, initial encounter: Secondary | ICD-10-CM | POA: Diagnosis not present

## 2022-03-13 LAB — CBC
HCT: 37.4 % (ref 36.0–46.0)
Hemoglobin: 11.6 g/dL — ABNORMAL LOW (ref 12.0–15.0)
MCH: 29.5 pg (ref 26.0–34.0)
MCHC: 31 g/dL (ref 30.0–36.0)
MCV: 95.2 fL (ref 80.0–100.0)
Platelets: 202 10*3/uL (ref 150–400)
RBC: 3.93 MIL/uL (ref 3.87–5.11)
RDW: 14.3 % (ref 11.5–15.5)
WBC: 7.8 10*3/uL (ref 4.0–10.5)
nRBC: 0 % (ref 0.0–0.2)

## 2022-03-13 LAB — PROTIME-INR
INR: 3.4 — ABNORMAL HIGH (ref 0.8–1.2)
Prothrombin Time: 34.3 seconds — ABNORMAL HIGH (ref 11.4–15.2)

## 2022-03-13 MED ORDER — LIDOCAINE-EPINEPHRINE 2 %-1:200000 IJ SOLN
10.0000 mL | Freq: Once | INTRAMUSCULAR | Status: AC
Start: 2022-03-13 — End: 2022-03-13
  Administered 2022-03-13: 10 mL
  Filled 2022-03-13: qty 20

## 2022-03-13 MED ORDER — ACETAMINOPHEN 325 MG PO TABS
650.0000 mg | ORAL_TABLET | Freq: Once | ORAL | Status: AC
Start: 2022-03-13 — End: 2022-03-13
  Administered 2022-03-13: 650 mg via ORAL
  Filled 2022-03-13: qty 2

## 2022-03-13 NOTE — ED Provider Notes (Signed)
[facility] EMERGENCY DEPARTMENT Provider Note   CSN: [id number] Arrival date & time: 03/12/22  2308     History  Chief Complaint  Patient presents with   Uncontrolled Bleeding    [name] is a 76 y.o. female.  Patient presents to the emergency department for bleeding from her scalp.  Patient reports that she did have an injury nearly 2 weeks ago.  She had staples placed and they were removed.  Since they were removed she has been having some bleeding from the site.  Patient does take Coumadin because of atrial fibrillation as well as valve replacements.       Home Medications Prior to Admission medications   Medication Sig Start Date End Date Taking? Authorizing Provider  albuterol (PROVENTIL) (2.5 MG/3ML) 0.083% nebulizer solution Take 3 mLs (2.5 mg total) by nebulization every 4 (four) hours as needed for wheezing or shortness of breath. 07/22/21   Tanda Rockers, MD  atorvastatin (LIPITOR) 40 MG tablet TAKE ONE TABLET BY MOUTH ONCE DAILY. 10/18/21   Arnoldo Lenis, MD  budesonide (PULMICORT) 0.25 MG/2ML nebulizer solution One 0.25 mg  twice daily  along with albuterol 12/13/21   Tanda Rockers, MD  cetirizine (ZYRTEC) 10 MG tablet Take 10 mg by mouth daily.    [provider]  dextromethorphan-guaiFENesin (MUCINEX DM) 30-600 MG 12hr tablet Take 1 tablet by mouth 2 (two) times daily.    [provider]  diclofenac Sodium (VOLTAREN) 1 % GEL Apply 2 g topically 4 (four) times daily. 10/26/21   Volney American, PA-C  diltiazem (CARDIZEM CD) 180 MG 24 hr capsule Take 1 capsule (180 mg total) by mouth daily. 08/03/13   Collins, Gina L, PA-C  escitalopram (LEXAPRO) 10 MG tablet Take 10 mg by mouth daily.  04/10/15   [provider]  famotidine (PEPCID) 20 MG tablet TAKE 1 TABLET AFTER SUPPER 09/20/21   Tanda Rockers, MD  fluticasone [facility]) 50 MCG/ACT nasal spray Place 1 spray into both nostrils daily as needed for allergies.  09/05/13    Arnoldo Lenis, MD  furosemide (LASIX) 20 MG tablet TAKE (1) TABLET BY MOUTH ONCE DAILY FOR SWELLING. 08/30/21   Arnoldo Lenis, MD  gabapentin (NEURONTIN) 100 MG capsule TAKE 1 CAPSULE BY MOUTH FOUR TIMES DAILY. 02/22/22   Tanda Rockers, MD  levothyroxine (SYNTHROID) 100 MCG tablet Take 100 mcg by mouth daily before breakfast.    [provider]  metFORMIN (GLUCOPHAGE) 500 MG tablet Take 1 tablet (500 mg total) by mouth 2 (two) times daily with a meal. 08/03/13   Collins, Gina L, PA-C  metoprolol tartrate (LOPRESSOR) 25 MG tablet Take 1.5 tablets (37.5 mg total) by mouth 2 (two) times daily. 08/13/21 11/11/21  Arnoldo Lenis, MD  Multiple Vitamin (MULITIVITAMIN WITH MINERALS) TABS Take 1 tablet by mouth daily.    [provider]  olmesartan (BENICAR) 5 MG tablet Take 5 mg by mouth daily.    [provider]  ondansetron (ZOFRAN) 4 MG tablet Take 1 tablet (4 mg total) by mouth every 6 (six) hours as needed for nausea. 06/04/21   Allie Bossier, MD  ondansetron (ZOFRAN) 4 MG tablet Take 1 tablet (4 mg total) by mouth every 8 (eight) hours as needed for nausea or vomiting. 02/24/22   Cherlynn June B, PA-C  ondansetron (ZOFRAN-ODT) 4 MG disintegrating tablet '4mg'$  ODT q4 hours prn nausea/vomit 11/11/21   Mesner, Corene Cornea, MD  pantoprazole (PROTONIX) 40 MG tablet TAKE  1 TABLET 30 TO 60 MINUTES BEFORE FIRST MEAL OF THE DAY. 02/07/22   Tanda Rockers, MD  potassium chloride SA (K-DUR,KLOR-CON) 20 MEQ tablet Take 20 mEq by mouth daily. 08/14/13   Gaye Pollack, MD  predniSONE (DELTASONE) 10 MG tablet Take  4 each am x 2 days,   2 each am x 2 days,  1 each am x 2 days and stop 12/13/21   Tanda Rockers, MD  PREVIDENT 5000 BOOSTER PLUS 1.1 % PSTE Place onto teeth. 05/28/21   [provider]  warfarin (COUMADIN) 2.5 MG tablet TAKE AS DIRECTED BY COUMADIN CLINIC. 03/09/22   Arnoldo Lenis, MD      Allergies    Amiodarone    Review of Systems   Review of  Systems  Physical Exam Updated Vital Signs BP 140/67 (BP Location: Left Arm)   Pulse 97   Temp 98.2 F (36.8 C) (Oral)   Resp 18   Ht '5\' 2"'$  (1.575 m)   Wt 79 kg   SpO2 93%   BMI 31.85 kg/m  Physical Exam Vitals and nursing note reviewed.  Constitutional:      General: She is not in acute distress.    Appearance: She is well-developed.  HENT:     Head: Normocephalic and atraumatic.      Mouth/Throat:     Mouth: Mucous membranes are moist.  Eyes:     General: Vision grossly intact. Gaze aligned appropriately.     Extraocular Movements: Extraocular movements intact.     Conjunctiva/sclera: Conjunctivae normal.  Cardiovascular:     Rate and Rhythm: Normal rate and regular rhythm.     Pulses: Normal pulses.     Heart sounds: Normal heart sounds, S1 normal and S2 normal. No murmur heard.    No friction rub. No gallop.  Pulmonary:     Effort: Pulmonary effort is normal. No respiratory distress.     Breath sounds: Normal breath sounds.  Abdominal:     General: Bowel sounds are normal.     Palpations: Abdomen is soft.     Tenderness: There is no abdominal tenderness. There is no guarding or rebound.     Hernia: No hernia is present.  Musculoskeletal:        General: No swelling.     Cervical back: Full passive range of motion without pain, normal range of motion and neck supple. No spinous process tenderness or muscular tenderness. Normal range of motion.     Right lower leg: No edema.     Left lower leg: No edema.  Skin:    General: Skin is warm and dry.     Capillary Refill: Capillary refill takes less than 2 seconds.     Findings: No ecchymosis, erythema, rash or wound.  Neurological:     General: No focal deficit present.     Mental Status: She is alert and oriented to person, place, and time.     GCS: GCS eye subscore is 4. GCS verbal subscore is 5. GCS motor subscore is 6.     Cranial Nerves: Cranial nerves 2-12 are intact.     Sensory: Sensation is intact.      Motor: Motor function is intact.     Coordination: Coordination is intact.  Psychiatric:        Attention and Perception: Attention normal.        Mood and Affect: Mood normal.        Speech: Speech normal.  Behavior: Behavior normal.     ED Results / Procedures / Treatments   Labs (all labs ordered are listed, but only abnormal results are displayed) Labs Reviewed  CBC - Abnormal; Notable for the following components:      Result Value   Hemoglobin 11.6 (*)    All other components within normal limits  PROTIME-INR - Abnormal; Notable for the following components:   Prothrombin Time 34.3 (*)    INR 3.4 (*)    All other components within normal limits    EKG None  Radiology CT HEAD WO CONTRAST (5MM)  Result Date: 03/13/2022 CLINICAL DATA:  Head trauma, fall. EXAM: CT HEAD WITHOUT CONTRAST TECHNIQUE: Contiguous axial images were obtained from the base of the skull through the vertex without intravenous contrast. RADIATION DOSE REDUCTION: This exam was performed according to the departmental dose-optimization program which includes automated exposure control, adjustment of the mA and/or kV according to patient size and/or use of iterative reconstruction technique. COMPARISON:  02/24/2022. FINDINGS: Brain: No acute intracranial hemorrhage, midline shift or mass effect. No extra-axial fluid collection. An old infarct is noted in the frontal lobe on the right. Diffuse atrophy is noted. Periventricular white matter hypodensities are present bilaterally. No hydrocephalus. Old infarcts are noted in the cerebellum on the left. Vascular: No hyperdense vessel or unexpected calcification. Skull: Normal. Negative for fracture or focal lesion. Sinuses/Orbits: No acute finding. Other: A large scalp hematoma is present over the parietal bone on the right. IMPRESSION: 1. Stable head CT with no acute intracranial process. 2. Large scalp hematoma over the parietal bone on the right. Electronically  Signed   By: Brett Fairy M.D.   On: 03/13/2022 00:17    Procedures .Marland KitchenIncision and Drainage  Date/Time: 03/13/2022 1:48 AM  Performed by: Orpah Greek, MD Authorized by: Orpah Greek, MD   Consent:    Consent obtained:  Verbal   Consent given by:  Patient   Risks, benefits, and alternatives were discussed: yes     Risks discussed:  Bleeding and incomplete drainage Universal protocol:    Procedure explained and questions answered to patient or proxy's satisfaction: yes     Relevant documents present and verified: yes     Test results available : yes     Imaging studies available: yes     Required blood products, implants, devices, and special equipment available: yes     Site/side marked: yes     Immediately prior to procedure, a time out was called: yes     Patient identity confirmed:  Verbally with patient Location:    Type:  Hematoma   Size:  3 cm   Location:  Head   Head location:  Scalp Pre-procedure details:    Skin preparation:  Chlorhexidine Sedation:    Sedation type:  None Anesthesia:    Anesthesia method:  Local infiltration   Local anesthetic:  Lidocaine 2% WITH epi Procedure type:    Complexity:  Simple Procedure details:    Needle aspiration: yes     Drainage:  Bloody     Medications Ordered in ED Medications  lidocaine-EPINEPHrine (XYLOCAINE W/EPI) 2 %-1:200000 (PF) injection 10 mL (has no administration in time range)  acetaminophen (TYLENOL) tablet 650 mg (has no administration in time range)    ED Course/ Medical Decision Making/ A&P                           Medical Decision Making Amount and/or  Complexity of Data Reviewed Labs: ordered. Radiology: ordered.  Risk Prescription drug management.   Patient presents to the emergency department for evaluation of bleeding from her scalp.  She has bleeding from site where she had staples placed recently.  Patient is on Coumadin.  Examination reveals a fluctuant hematoma that  is somewhat tense, likely leading to the oozing of blood.  Repeat CT does not show any intracranial bleeding.  Scalp was numbed with lidocaine with epinephrine and then the hematoma was evacuated with an 18-gauge needle.  Approximately 4 mL of blood was removed, area was no longer tense or fluctuant.  Pressure dressing placed.        Final Clinical Impression(s) / ED Diagnoses Final diagnoses:  Scalp hematoma, initial encounter    Rx / DC Orders ED Discharge Orders     None         Jackson Fetters, Gwenyth Allegra, MD 03/13/22 0151

## 2022-03-16 ENCOUNTER — Encounter (HOSPITAL_COMMUNITY): Payer: Self-pay

## 2022-03-16 ENCOUNTER — Other Ambulatory Visit: Payer: Self-pay

## 2022-03-16 ENCOUNTER — Emergency Department (HOSPITAL_COMMUNITY): Payer: Medicare PPO

## 2022-03-16 ENCOUNTER — Emergency Department (HOSPITAL_COMMUNITY)
Admission: EM | Admit: 2022-03-16 | Discharge: 2022-03-16 | Disposition: A | Discharge status: Home / Self Care | Payer: Medicare PPO | Attending: Emergency Medicine | Admitting: Emergency Medicine

## 2022-03-16 ENCOUNTER — Other Ambulatory Visit: Payer: Self-pay | Admitting: Cardiology

## 2022-03-16 DIAGNOSIS — S0003XD Contusion of scalp, subsequent encounter: Secondary | ICD-10-CM

## 2022-03-16 DIAGNOSIS — Z7984 Long term (current) use of oral hypoglycemic drugs: Secondary | ICD-10-CM | POA: Diagnosis not present

## 2022-03-16 DIAGNOSIS — E1122 Type 2 diabetes mellitus with diabetic chronic kidney disease: Secondary | ICD-10-CM | POA: Diagnosis not present

## 2022-03-16 DIAGNOSIS — S0003XA Contusion of scalp, initial encounter: Secondary | ICD-10-CM | POA: Insufficient documentation

## 2022-03-16 DIAGNOSIS — I129 Hypertensive chronic kidney disease with stage 1 through stage 4 chronic kidney disease, or unspecified chronic kidney disease: Secondary | ICD-10-CM | POA: Insufficient documentation

## 2022-03-16 DIAGNOSIS — Z79899 Other long term (current) drug therapy: Secondary | ICD-10-CM | POA: Diagnosis not present

## 2022-03-16 DIAGNOSIS — W01198A Fall on same level from slipping, tripping and stumbling with subsequent striking against other object, initial encounter: Secondary | ICD-10-CM | POA: Insufficient documentation

## 2022-03-16 DIAGNOSIS — N182 Chronic kidney disease, stage 2 (mild): Secondary | ICD-10-CM | POA: Insufficient documentation

## 2022-03-16 DIAGNOSIS — S0990XA Unspecified injury of head, initial encounter: Secondary | ICD-10-CM | POA: Diagnosis present

## 2022-03-16 DIAGNOSIS — Z7901 Long term (current) use of anticoagulants: Secondary | ICD-10-CM | POA: Diagnosis not present

## 2022-03-16 DIAGNOSIS — R519 Headache, unspecified: Secondary | ICD-10-CM

## 2022-03-16 MED ORDER — LIDOCAINE-EPINEPHRINE (PF) 2 %-1:200000 IJ SOLN: 10.0000 mL | Freq: Once | INTRAMUSCULAR | Status: DC

## 2022-03-16 NOTE — ED Triage Notes (Signed)
Pt had fall a couple of weeks ago and had lac to posterior scalp. Are has begun to bleed again. Bleeding controlled at this time. There is a hematoma on scalp. Pt is on blood thinners. Pt also has headache

## 2022-03-16 NOTE — ED Provider Notes (Signed)
[facility] EMERGENCY DEPARTMENT Provider Note   CSN: [id number] Arrival date & time: 03/16/22  0455     History  Chief Complaint  Patient presents with   bleeding hematoma   Head Injury    [name] is a 76 y.o. female with medical history of diabetes mellitus, CKD stage II, atrial fibrillation (on warfarin), hypertension, depression, GERD, anemia.  Presents to the emergency department with a chief complaint of headache and swelling to scalp.  Patient reports that she suffered a fall at the end of May.  Was seen in the emergency department after that incident.  Patient reports that she had staples placed to scalp laceration.  After staples removed patient did have bleeding and swelling to her scalp.  Patient was seen on 6/10 for similar symptoms and had incision and drainage performed.  Since then swelling has gotten progressively worse.  Patient has noted bleeding from the laceration site.  Patient complains of pain to affected area.  In addition to pain associated with laceration and swelling patient has had headaches as well.  Patient reports that headache is primarily located to the frontotemporal aspect of her head.  Pain onset was gradual pain progressively worse over time.  Patient denies any aggravating or alleviating factors.  Patient denies any new falls or head injuries.  Denies any numbness, weakness, facial asymmetry, dysarthria, visual disturbance, fevers, purulent discharge, erythema.   Head Injury Associated symptoms: headache   Associated symptoms: no nausea, no neck pain, no numbness, no seizures and no vomiting        Home Medications Prior to Admission medications   Medication Sig Start Date End Date Taking? Authorizing Provider  albuterol (PROVENTIL) (2.5 MG/3ML) 0.083% nebulizer solution Take 3 mLs (2.5 mg total) by nebulization every 4 (four) hours as needed for wheezing or shortness of breath. 07/22/21   Tanda Rockers, MD   atorvastatin (LIPITOR) 40 MG tablet TAKE ONE TABLET BY MOUTH ONCE DAILY. 10/18/21   Arnoldo Lenis, MD  budesonide (PULMICORT) 0.25 MG/2ML nebulizer solution One 0.25 mg  twice daily  along with albuterol 12/13/21   Tanda Rockers, MD  cetirizine (ZYRTEC) 10 MG tablet Take 10 mg by mouth daily.    [provider]  dextromethorphan-guaiFENesin (MUCINEX DM) 30-600 MG 12hr tablet Take 1 tablet by mouth 2 (two) times daily.    [provider]  diclofenac Sodium (VOLTAREN) 1 % GEL Apply 2 g topically 4 (four) times daily. 10/26/21   Volney American, PA-C  diltiazem (CARDIZEM CD) 180 MG 24 hr capsule Take 1 capsule (180 mg total) by mouth daily. 08/03/13   Collins, Gina L, PA-C  escitalopram (LEXAPRO) 10 MG tablet Take 10 mg by mouth daily.  04/10/15   [provider]  famotidine (PEPCID) 20 MG tablet TAKE 1 TABLET AFTER SUPPER 09/20/21   Tanda Rockers, MD  fluticasone [facility]) 50 MCG/ACT nasal spray Place 1 spray into both nostrils daily as needed for allergies.  09/05/13   Arnoldo Lenis, MD  furosemide (LASIX) 20 MG tablet TAKE (1) TABLET BY MOUTH ONCE DAILY FOR SWELLING. 08/30/21   Arnoldo Lenis, MD  gabapentin (NEURONTIN) 100 MG capsule TAKE 1 CAPSULE BY MOUTH FOUR TIMES DAILY. 02/22/22   Tanda Rockers, MD  levothyroxine (SYNTHROID) 100 MCG tablet Take 100 mcg by mouth daily before breakfast.    [provider]  metFORMIN (GLUCOPHAGE) 500 MG tablet Take 1 tablet (500 mg total) by mouth 2 (two) times  daily with a meal. 08/03/13   Collins, Gina L, PA-C  metoprolol tartrate (LOPRESSOR) 25 MG tablet Take 1.5 tablets (37.5 mg total) by mouth 2 (two) times daily. 08/13/21 11/11/21  Arnoldo Lenis, MD  Multiple Vitamin (MULITIVITAMIN WITH MINERALS) TABS Take 1 tablet by mouth daily.    [provider]  olmesartan (BENICAR) 5 MG tablet Take 5 mg by mouth daily.    [provider]  ondansetron (ZOFRAN) 4 MG tablet Take 1 tablet (4 mg  total) by mouth every 6 (six) hours as needed for nausea. 06/04/21   Allie Bossier, MD  ondansetron (ZOFRAN) 4 MG tablet Take 1 tablet (4 mg total) by mouth every 8 (eight) hours as needed for nausea or vomiting. 02/24/22   Cherlynn June B, PA-C  ondansetron (ZOFRAN-ODT) 4 MG disintegrating tablet '4mg'$  ODT q4 hours prn nausea/vomit 11/11/21   Mesner, Corene Cornea, MD  pantoprazole (PROTONIX) 40 MG tablet TAKE 1 TABLET 30 TO 60 MINUTES BEFORE FIRST MEAL OF THE DAY. 02/07/22   Tanda Rockers, MD  potassium chloride SA (K-DUR,KLOR-CON) 20 MEQ tablet Take 20 mEq by mouth daily. 08/14/13   Gaye Pollack, MD  predniSONE (DELTASONE) 10 MG tablet Take  4 each am x 2 days,   2 each am x 2 days,  1 each am x 2 days and stop 12/13/21   Tanda Rockers, MD  PREVIDENT 5000 BOOSTER PLUS 1.1 % PSTE Place onto teeth. 05/28/21   [provider]  warfarin (COUMADIN) 2.5 MG tablet TAKE AS DIRECTED BY COUMADIN CLINIC. 03/09/22   Arnoldo Lenis, MD      Allergies    Amiodarone    Review of Systems   Review of Systems  Constitutional:  Positive for chills. Negative for fever.  Eyes:  Negative for visual disturbance.  Respiratory:  Negative for shortness of breath.   Cardiovascular:  Negative for chest pain.  Gastrointestinal:  Negative for abdominal pain, nausea and vomiting.  Musculoskeletal:  Negative for back pain and neck pain.  Skin:  Positive for wound. Negative for color change, pallor and rash.  Neurological:  Positive for headaches. Negative for dizziness, tremors, seizures, syncope, facial asymmetry, speech difficulty, weakness, light-headedness and numbness.  Psychiatric/Behavioral:  Negative for confusion.     Physical Exam Updated Vital Signs BP (!) 150/74   Pulse 86   Temp 97.9 F (36.6 C) (Oral)   Resp 18   Ht '5\' 2"'$  (1.575 m)   Wt 79 kg   SpO2 91%   BMI 31.85 kg/m  Physical Exam Vitals and nursing note reviewed.  Constitutional:      General: She is not in acute distress.     Appearance: She is not ill-appearing, toxic-appearing or diaphoretic.  HENT:     Head:      Comments: Patient has large hematoma to scalp as noted above.  Minimal dried blood noted around swelling site.  Area is exquisitely tender.  No surrounding rash, erythema, warmth, or purulent discharge. Eyes:     General:        Right eye: No discharge.        Left eye: No discharge.     Extraocular Movements: Extraocular movements intact.     Conjunctiva/sclera: Conjunctivae normal.     Pupils: Pupils are equal, round, and reactive to light.  Cardiovascular:     Rate and Rhythm: Normal rate.  Pulmonary:     Effort: Pulmonary effort is normal.  Musculoskeletal:     Cervical  back: Normal range of motion and neck supple. No rigidity.  Skin:    General: Skin is warm and dry.  Neurological:     General: No focal deficit present.     Mental Status: She is alert.     GCS: GCS eye subscore is 4. GCS verbal subscore is 5. GCS motor subscore is 6.     Cranial Nerves: No cranial nerve deficit or facial asymmetry.     Sensory: Sensation is intact.     Motor: No weakness, tremor, seizure activity or pronator drift.     Coordination: Finger-Nose-Finger Test normal.     Gait: Gait is intact. Gait normal.     Comments: CN II-XII intact; performed in supine position, +5 strength to bilateral upper extremities, +5 strength to dorsiflexion and plantarflexion, patient able to lift both legs against gravity and hold each there without difficulty, sensation to light touch grossly intact to bilateral upper and lower extremities.  Psychiatric:        Behavior: Behavior is cooperative.     ED Results / Procedures / Treatments   Labs (all labs ordered are listed, but only abnormal results are displayed) Labs Reviewed - No data to display  EKG None  Radiology CT HEAD WO CONTRAST (5MM)  Result Date: 03/16/2022 CLINICAL DATA:  Fall, hit back of head EXAM: CT HEAD WITHOUT CONTRAST TECHNIQUE: Contiguous axial  images were obtained from the base of the skull through the vertex without intravenous contrast. RADIATION DOSE REDUCTION: This exam was performed according to the departmental dose-optimization program which includes automated exposure control, adjustment of the mA and/or kV according to patient size and/or use of iterative reconstruction technique. COMPARISON:  CT head 03/12/2022 FINDINGS: Brain: There is no acute intracranial hemorrhage, extra-axial fluid collection, or acute infarct. Is mild global parenchymal volume loss with prominence of the ventricular system and extra-axial CSF spaces. Encephalomalacia in the right superior frontal gyrus is unchanged. Slight ex vacuo dilatation of the adjacent right lateral ventricle is unchanged. Gray-white differentiation is otherwise preserved patchy hypodensity in the remainder of the subcortical and periventricular white matter likely reflects sequela of chronic white matter microangiopathy. There is no mass lesion.  There is no mass effect or midline shift. Vascular: No hyperdense vessel or unexpected calcification. Skull: Normal. Negative for fracture or focal lesion. Sinuses/Orbits: The paranasal sinuses are clear. The globes and orbits are unremarkable. Other: There is a moderate-sized right parietal scalp hematoma. IMPRESSION: 1. No acute intracranial pathology. 2. Moderate-sized right parietal scalp hematoma without underlying calvarial fracture. Electronically Signed   By: Valetta Mole M.D.   On: 03/16/2022 08:51    Procedures Procedures    Medications Ordered in ED Medications - No data to display  ED Course/ Medical Decision Making/ A&P                           Medical Decision Making Amount and/or Complexity of Data Reviewed Radiology: ordered.  Risk Prescription drug management.   Alert 76 year old female in no acute distress, nontoxic-appearing.  Presents to the emergency department with a chief complaint of headaches and scalp  hematoma.  Information obtained from patient, patient's husband, and patient's daughter.  I reviewed patient's past medical records including outpatient notes, imaging, and previous lab results.  Patient has medical history as outlined in HPI which complicates her care.  Patient suffered a fall on 5/25 and was seen at [facility] emergency department.  CT head imaging showed no  acute intracranial abnormality.  Laceration to scalp was repaired with staples.  Patient was seen on 6/10 for similar symptoms as today.  INR within normal limits, no significant anemia present on labs.  Noncontrast head CT showed no acute intracranial abnormality.  Due to patient reporting new/worsening headaches will obtain noncontrast head CT to evaluate for possible intracranial hemorrhage as patient is on warfarin.  I personally viewed and interpreted patient CT imaging.  Agree with radiology interpretation of no acute intracranial abnormality.  Moderate sized right parietal scalp hematoma.  Low suspicion for abscess to patient's scalp as no reported purulent discharge and recent aspiration was bloody only.  Suspect that patient's hematoma secondary to bleeding from patient's blood thinner use.  Will apply dressing to scalp for direct pressure.  Patient to follow-up with PCP in outpatient setting for repeat evaluation.  Patient care discussed with attending physician Dr. Billy Fischer.  Based on patient's chief complaint, I considered admission might be necessary, however after reassuring ED workup feel patient is reasonable for discharge.  Discussed results, findings, treatment and follow up. Patient advised of return precautions. Patient verbalized understanding and agreed with plan.  Portions of this note were generated with [employer]. Dictation errors may occur despite best attempts at proofreading.         Final Clinical Impression(s) / ED Diagnoses Final diagnoses:  Hematoma of scalp,  subsequent encounter  Acute nonintractable headache, unspecified headache type    Rx / DC Orders ED Discharge Orders     None         Loni Beckwith, PA-C 03/16/22 [id number]    Gareth Morgan, MD 03/17/22 0007

## 2022-03-16 NOTE — Discharge Instructions (Addendum)
You came to the emergency department today to be evaluated for your scalp laceration/hematoma as well as your headaches.  The CT scan of your head did not show any skull fractures or intracranial bleeding.  The hematoma on your scalp is likely due to bleeding secondary to your blood thinner use.  This should gradually improve over time.  Please follow-up with your primary care doctor for repeat evaluation.  Please take Tylenol (acetaminophen) to relieve your pain.  You make take tylenol, up to 1,000 mg (two extra strength pills) every 8 hours as needed.  Do not take more than 3,000 mg tylenol in a 24 hour period (not more than one dose every 8 hours.  Please check all medication labels as many medications such as pain and cold medications may contain tylenol.  Do not drink alcohol while taking these medications.   Get help right away if: You have new or worsening physical symptoms, such as: A severe or worsening headache. Weakness or numbness in any part of your body, slurred speech, vision changes, or confusion. Your coordination gets worse. Vomiting repeatedly. You have a seizure. You have unusual behavior changes. You lose consciousness, are sleepier than normal, or are difficult to wake up.

## 2022-03-16 NOTE — ED Notes (Signed)
Patient's head wrap with head gauze.

## 2022-03-31 ENCOUNTER — Encounter (HOSPITAL_COMMUNITY): Payer: Self-pay | Admitting: *Deleted

## 2022-03-31 ENCOUNTER — Emergency Department (HOSPITAL_COMMUNITY): Payer: Medicare PPO

## 2022-03-31 ENCOUNTER — Observation Stay (HOSPITAL_COMMUNITY): Payer: Medicare PPO

## 2022-03-31 ENCOUNTER — Other Ambulatory Visit: Payer: Self-pay

## 2022-03-31 ENCOUNTER — Inpatient Hospital Stay (HOSPITAL_COMMUNITY)
Admission: EM | Admit: 2022-03-31 | Discharge: 2022-04-03 | DRG: 291 | Disposition: A | Discharge status: Home / Self Care | Payer: Medicare PPO | Attending: Internal Medicine | Admitting: Internal Medicine

## 2022-03-31 DIAGNOSIS — I639 Cerebral infarction, unspecified: Secondary | ICD-10-CM | POA: Diagnosis present

## 2022-03-31 DIAGNOSIS — Z952 Presence of prosthetic heart valve: Secondary | ICD-10-CM | POA: Diagnosis not present

## 2022-03-31 DIAGNOSIS — I130 Hypertensive heart and chronic kidney disease with heart failure and stage 1 through stage 4 chronic kidney disease, or unspecified chronic kidney disease: Secondary | ICD-10-CM | POA: Diagnosis present

## 2022-03-31 DIAGNOSIS — J189 Pneumonia, unspecified organism: Secondary | ICD-10-CM | POA: Diagnosis not present

## 2022-03-31 DIAGNOSIS — Z8261 Family history of arthritis: Secondary | ICD-10-CM | POA: Diagnosis not present

## 2022-03-31 DIAGNOSIS — Z20822 Contact with and (suspected) exposure to covid-19: Secondary | ICD-10-CM | POA: Diagnosis present

## 2022-03-31 DIAGNOSIS — Z833 Family history of diabetes mellitus: Secondary | ICD-10-CM | POA: Diagnosis not present

## 2022-03-31 DIAGNOSIS — Z7952 Long term (current) use of systemic steroids: Secondary | ICD-10-CM

## 2022-03-31 DIAGNOSIS — Z8249 Family history of ischemic heart disease and other diseases of the circulatory system: Secondary | ICD-10-CM | POA: Diagnosis not present

## 2022-03-31 DIAGNOSIS — K219 Gastro-esophageal reflux disease without esophagitis: Secondary | ICD-10-CM | POA: Diagnosis present

## 2022-03-31 DIAGNOSIS — R0902 Hypoxemia: Secondary | ICD-10-CM | POA: Diagnosis not present

## 2022-03-31 DIAGNOSIS — Z825 Family history of asthma and other chronic lower respiratory diseases: Secondary | ICD-10-CM

## 2022-03-31 DIAGNOSIS — Z853 Personal history of malignant neoplasm of breast: Secondary | ICD-10-CM | POA: Diagnosis not present

## 2022-03-31 DIAGNOSIS — Z79899 Other long term (current) drug therapy: Secondary | ICD-10-CM

## 2022-03-31 DIAGNOSIS — E1143 Type 2 diabetes mellitus with diabetic autonomic (poly)neuropathy: Secondary | ICD-10-CM | POA: Diagnosis present

## 2022-03-31 DIAGNOSIS — I4891 Unspecified atrial fibrillation: Secondary | ICD-10-CM | POA: Diagnosis present

## 2022-03-31 DIAGNOSIS — J449 Chronic obstructive pulmonary disease, unspecified: Secondary | ICD-10-CM | POA: Diagnosis present

## 2022-03-31 DIAGNOSIS — Z9181 History of falling: Secondary | ICD-10-CM | POA: Diagnosis not present

## 2022-03-31 DIAGNOSIS — J9601 Acute respiratory failure with hypoxia: Secondary | ICD-10-CM | POA: Diagnosis present

## 2022-03-31 DIAGNOSIS — I5033 Acute on chronic diastolic (congestive) heart failure: Secondary | ICD-10-CM | POA: Diagnosis present

## 2022-03-31 DIAGNOSIS — E1169 Type 2 diabetes mellitus with other specified complication: Secondary | ICD-10-CM

## 2022-03-31 DIAGNOSIS — E876 Hypokalemia: Secondary | ICD-10-CM | POA: Diagnosis present

## 2022-03-31 DIAGNOSIS — Z888 Allergy status to other drugs, medicaments and biological substances status: Secondary | ICD-10-CM

## 2022-03-31 DIAGNOSIS — Z8673 Personal history of transient ischemic attack (TIA), and cerebral infarction without residual deficits: Secondary | ICD-10-CM | POA: Diagnosis not present

## 2022-03-31 DIAGNOSIS — I509 Heart failure, unspecified: Principal | ICD-10-CM

## 2022-03-31 DIAGNOSIS — E119 Type 2 diabetes mellitus without complications: Secondary | ICD-10-CM | POA: Diagnosis not present

## 2022-03-31 DIAGNOSIS — N182 Chronic kidney disease, stage 2 (mild): Secondary | ICD-10-CM | POA: Diagnosis present

## 2022-03-31 DIAGNOSIS — I10 Essential (primary) hypertension: Secondary | ICD-10-CM | POA: Diagnosis present

## 2022-03-31 DIAGNOSIS — I5031 Acute diastolic (congestive) heart failure: Secondary | ICD-10-CM

## 2022-03-31 DIAGNOSIS — I110 Hypertensive heart disease with heart failure: Secondary | ICD-10-CM | POA: Diagnosis not present

## 2022-03-31 DIAGNOSIS — E1122 Type 2 diabetes mellitus with diabetic chronic kidney disease: Secondary | ICD-10-CM | POA: Diagnosis present

## 2022-03-31 DIAGNOSIS — Z79890 Hormone replacement therapy (postmenopausal): Secondary | ICD-10-CM | POA: Diagnosis not present

## 2022-03-31 DIAGNOSIS — Z7901 Long term (current) use of anticoagulants: Secondary | ICD-10-CM | POA: Diagnosis not present

## 2022-03-31 DIAGNOSIS — Z7951 Long term (current) use of inhaled steroids: Secondary | ICD-10-CM

## 2022-03-31 DIAGNOSIS — Z793 Long term (current) use of hormonal contraceptives: Secondary | ICD-10-CM

## 2022-03-31 DIAGNOSIS — J90 Pleural effusion, not elsewhere classified: Secondary | ICD-10-CM | POA: Diagnosis not present

## 2022-03-31 DIAGNOSIS — E785 Hyperlipidemia, unspecified: Secondary | ICD-10-CM | POA: Diagnosis present

## 2022-03-31 DIAGNOSIS — R0602 Shortness of breath: Secondary | ICD-10-CM | POA: Diagnosis not present

## 2022-03-31 LAB — MAGNESIUM: Magnesium: 1.9 mg/dL (ref 1.7–2.4)

## 2022-03-31 LAB — COMPREHENSIVE METABOLIC PANEL
ALT: 20 U/L (ref 0–44)
AST: 24 U/L (ref 15–41)
Albumin: 4.1 g/dL (ref 3.5–5.0)
Alkaline Phosphatase: 68 U/L (ref 38–126)
Anion gap: 7 (ref 5–15)
BUN: 12 mg/dL (ref 8–23)
CO2: 27 mmol/L (ref 22–32)
Calcium: 9.1 mg/dL (ref 8.9–10.3)
Chloride: 104 mmol/L (ref 98–111)
Creatinine, Ser: 1 mg/dL (ref 0.44–1.00)
GFR, Estimated: 58 mL/min — ABNORMAL LOW (ref >60)
Glucose, Bld: 117 mg/dL — ABNORMAL HIGH (ref 70–99)
Potassium: 4 mmol/L (ref 3.5–5.1)
Sodium: 138 mmol/L (ref 135–145)
Total Bilirubin: 0.8 mg/dL (ref 0.3–1.2)
Total Protein: 7.6 g/dL (ref 6.5–8.1)

## 2022-03-31 LAB — TROPONIN I (HIGH SENSITIVITY)
Troponin I (High Sensitivity): 11 ng/L (ref <18)
Troponin I (High Sensitivity): 10 ng/L (ref <18)

## 2022-03-31 LAB — PROTIME-INR
INR: 2.7 — ABNORMAL HIGH (ref 0.8–1.2)
Prothrombin Time: 28.4 seconds — ABNORMAL HIGH (ref 11.4–15.2)

## 2022-03-31 LAB — CBC
HCT: 41.2 % (ref 36.0–46.0)
Hemoglobin: 12.2 g/dL (ref 12.0–15.0)
MCH: 28 pg (ref 26.0–34.0)
MCHC: 29.6 g/dL — ABNORMAL LOW (ref 30.0–36.0)
MCV: 94.7 fL (ref 80.0–100.0)
Platelets: 173 10*3/uL (ref 150–400)
RBC: 4.35 MIL/uL (ref 3.87–5.11)
RDW: 15 % (ref 11.5–15.5)
WBC: 6.5 10*3/uL (ref 4.0–10.5)
nRBC: 0 % (ref 0.0–0.2)

## 2022-03-31 LAB — BRAIN NATRIURETIC PEPTIDE: B Natriuretic Peptide: 297 pg/mL — ABNORMAL HIGH (ref 0.0–100.0)

## 2022-03-31 LAB — GLUCOSE, CAPILLARY: Glucose-Capillary: 176 mg/dL — ABNORMAL HIGH (ref 70–99)

## 2022-03-31 LAB — LIPASE, BLOOD: Lipase: 52 U/L — ABNORMAL HIGH (ref 11–51)

## 2022-03-31 LAB — SARS CORONAVIRUS 2 BY RT PCR: SARS Coronavirus 2 by RT PCR: NEGATIVE

## 2022-03-31 MED ORDER — POTASSIUM CHLORIDE CRYS ER 20 MEQ PO TBCR
20.0000 meq | EXTENDED_RELEASE_TABLET | Freq: Every day | ORAL | Status: DC
  Administered 2022-04-01: 20 meq via ORAL
  Filled 2022-03-31 (×2): qty 1

## 2022-03-31 MED ORDER — SODIUM CHLORIDE 0.9 % IV SOLN: 2.0000 g | INTRAVENOUS | Status: DC

## 2022-03-31 MED ORDER — ESCITALOPRAM OXALATE 10 MG PO TABS
10.0000 mg | ORAL_TABLET | Freq: Every day | ORAL | Status: DC
  Administered 2022-04-01 – 2022-04-03 (×3): 10 mg via ORAL
  Filled 2022-03-31 (×3): qty 1

## 2022-03-31 MED ORDER — ALBUTEROL SULFATE (2.5 MG/3ML) 0.083% IN NEBU
2.5000 mg | INHALATION_SOLUTION | RESPIRATORY_TRACT | Status: DC | PRN
  Administered 2022-04-01: 2.5 mg via RESPIRATORY_TRACT
  Filled 2022-03-31: qty 3

## 2022-03-31 MED ORDER — ACETAMINOPHEN 650 MG RE SUPP: 650.0000 mg | Freq: Four times a day (QID) | RECTAL | Status: DC | PRN

## 2022-03-31 MED ORDER — WARFARIN SODIUM 2.5 MG PO TABS: 2.5000 mg | ORAL_TABLET | ORAL | Status: DC

## 2022-03-31 MED ORDER — INSULIN ASPART 100 UNIT/ML IJ SOLN: 0.0000 [IU] | Freq: Every day | INTRAMUSCULAR | Status: DC

## 2022-03-31 MED ORDER — FUROSEMIDE 10 MG/ML IJ SOLN
40.0000 mg | Freq: Once | INTRAMUSCULAR | Status: AC
  Administered 2022-03-31: 40 mg via INTRAVENOUS
  Filled 2022-03-31: qty 4

## 2022-03-31 MED ORDER — ACETAMINOPHEN 325 MG PO TABS: 650.0000 mg | ORAL_TABLET | Freq: Four times a day (QID) | ORAL | Status: DC | PRN

## 2022-03-31 MED ORDER — LEVOTHYROXINE SODIUM 100 MCG PO TABS
100.0000 ug | ORAL_TABLET | Freq: Every day | ORAL | Status: DC
  Administered 2022-04-01 – 2022-04-03 (×3): 100 ug via ORAL
  Filled 2022-03-31 (×3): qty 1

## 2022-03-31 MED ORDER — ATORVASTATIN CALCIUM 40 MG PO TABS
40.0000 mg | ORAL_TABLET | Freq: Every day | ORAL | Status: DC
  Administered 2022-04-01 – 2022-04-03 (×3): 40 mg via ORAL
  Filled 2022-03-31 (×3): qty 1

## 2022-03-31 MED ORDER — FLUTICASONE PROPIONATE 50 MCG/ACT NA SUSP: 1.0000 | Freq: Every day | NASAL | Status: DC | PRN

## 2022-03-31 MED ORDER — DILTIAZEM HCL ER COATED BEADS 180 MG PO CP24
180.0000 mg | ORAL_CAPSULE | Freq: Every day | ORAL | Status: DC
  Administered 2022-04-01 – 2022-04-03 (×3): 180 mg via ORAL
  Filled 2022-03-31 (×3): qty 1

## 2022-03-31 MED ORDER — WARFARIN 1.25 MG HALF TABLET
3.7500 mg | ORAL_TABLET | ORAL | Status: DC
  Filled 2022-03-31: qty 1

## 2022-03-31 MED ORDER — SODIUM CHLORIDE 0.9 % IV SOLN
1.0000 g | Freq: Once | INTRAVENOUS | Status: AC
  Administered 2022-03-31: 1 g via INTRAVENOUS
  Filled 2022-03-31: qty 10

## 2022-03-31 MED ORDER — AZITHROMYCIN 250 MG PO TABS
500.0000 mg | ORAL_TABLET | Freq: Once | ORAL | Status: AC
  Administered 2022-03-31: 500 mg via ORAL
  Filled 2022-03-31: qty 2

## 2022-03-31 MED ORDER — WARFARIN SODIUM 2.5 MG PO TABS
3.7500 mg | ORAL_TABLET | ORAL | Status: DC
  Filled 2022-03-31: qty 1

## 2022-03-31 MED ORDER — GABAPENTIN 100 MG PO CAPS
100.0000 mg | ORAL_CAPSULE | Freq: Four times a day (QID) | ORAL | Status: DC
  Administered 2022-03-31 – 2022-04-03 (×10): 100 mg via ORAL
  Filled 2022-03-31 (×10): qty 1

## 2022-03-31 MED ORDER — FUROSEMIDE 10 MG/ML IJ SOLN
40.0000 mg | Freq: Two times a day (BID) | INTRAMUSCULAR | Status: DC
  Administered 2022-04-01 (×2): 40 mg via INTRAVENOUS
  Filled 2022-03-31 (×3): qty 4

## 2022-03-31 MED ORDER — INSULIN ASPART 100 UNIT/ML IJ SOLN
0.0000 [IU] | Freq: Three times a day (TID) | INTRAMUSCULAR | Status: DC
  Administered 2022-04-01 (×2): 2 [IU] via SUBCUTANEOUS
  Administered 2022-04-02 (×2): 1 [IU] via SUBCUTANEOUS
  Administered 2022-04-02: 2 [IU] via SUBCUTANEOUS
  Administered 2022-04-03: 1 [IU] via SUBCUTANEOUS

## 2022-03-31 MED ORDER — PANTOPRAZOLE SODIUM 40 MG PO TBEC
40.0000 mg | DELAYED_RELEASE_TABLET | Freq: Every day | ORAL | Status: DC
  Administered 2022-04-01 – 2022-04-03 (×3): 40 mg via ORAL
  Filled 2022-03-31 (×3): qty 1

## 2022-03-31 MED ORDER — ALBUTEROL SULFATE (2.5 MG/3ML) 0.083% IN NEBU: 2.5000 mg | INHALATION_SOLUTION | Freq: Two times a day (BID) | RESPIRATORY_TRACT | Status: DC | PRN

## 2022-03-31 MED ORDER — POLYETHYLENE GLYCOL 3350 17 G PO PACK: 17.0000 g | PACK | Freq: Every day | ORAL | Status: DC | PRN

## 2022-03-31 MED ORDER — LORATADINE 10 MG PO TABS
10.0000 mg | ORAL_TABLET | Freq: Every day | ORAL | Status: DC
  Administered 2022-04-01 – 2022-04-03 (×3): 10 mg via ORAL
  Filled 2022-03-31 (×3): qty 1

## 2022-03-31 MED ORDER — IRBESARTAN 75 MG PO TABS: 37.5000 mg | ORAL_TABLET | Freq: Every day | ORAL | Status: DC

## 2022-03-31 MED ORDER — IOHEXOL 300 MG/ML  SOLN
75.0000 mL | Freq: Once | INTRAMUSCULAR | Status: AC | PRN
  Administered 2022-03-31: 75 mL via INTRAVENOUS

## 2022-03-31 MED ORDER — METOPROLOL TARTRATE 25 MG PO TABS
37.5000 mg | ORAL_TABLET | Freq: Two times a day (BID) | ORAL | Status: DC
  Administered 2022-03-31 – 2022-04-03 (×6): 37.5 mg via ORAL
  Filled 2022-03-31 (×6): qty 2

## 2022-03-31 MED ORDER — WARFARIN - PHARMACIST DOSING INPATIENT: Freq: Every day | Status: DC

## 2022-03-31 MED ORDER — WARFARIN SODIUM 7.5 MG PO TABS
3.7500 mg | ORAL_TABLET | Freq: Once | ORAL | Status: AC
  Administered 2022-03-31: 3.75 mg via ORAL
  Filled 2022-03-31: qty 0.5

## 2022-03-31 MED ORDER — BUDESONIDE 0.5 MG/2ML IN SUSP
0.5000 mg | Freq: Two times a day (BID) | RESPIRATORY_TRACT | Status: DC
  Administered 2022-04-01 – 2022-04-03 (×5): 0.5 mg via RESPIRATORY_TRACT
  Filled 2022-03-31 (×5): qty 2

## 2022-03-31 MED ORDER — SODIUM CHLORIDE 0.9 % IV SOLN
100.0000 mg | Freq: Two times a day (BID) | INTRAVENOUS | Status: DC
  Filled 2022-03-31 (×2): qty 100

## 2022-03-31 MED ORDER — IPRATROPIUM-ALBUTEROL 0.5-2.5 (3) MG/3ML IN SOLN
3.0000 mL | Freq: Once | RESPIRATORY_TRACT | Status: AC
  Administered 2022-03-31: 3 mL via RESPIRATORY_TRACT
  Filled 2022-03-31: qty 3

## 2022-03-31 NOTE — Assessment & Plan Note (Addendum)
As yet unspecified type CHF.  Bilateral pitting pedal edema of 1 week, abdominal bloating.  Weight today 180lbs, baseline weights 170- 173.  Chest x-ray without congestion or edema.  BNP elevated at 297 back to baseline of low 100s.  Echo 2021 EF of 60-65%, indeterminate LV parameters, showed replaced aortic and mitral valves.  Denies dietary indiscretion.  Has been compliant with home Lasix '20mg'$  daily, took an extra dose over the past 3 days. -Obtain updated echocardiogram -IV Lasix 40 twice daily -Strict input output, daily weights -Daily BMP -

## 2022-03-31 NOTE — H&P (Addendum)
History and Physical    [name] WNU:[id number] DOB: 10-23-1945 DOA: 03/31/2022  PCP: [name], MD   Patient coming from: Home  I have personally briefly reviewed patient's old medical records in [place]  Chief Complaint: Difficulty breathing  HPI: [name] is a 76 y.o. female with medical history significant for COPD and asthma, atrial fibrillation, stroke, aortic and mitral valve replacement, on chronic anticoagulation, diabetes mellitus. Patient presented to the ED with reports of difficulty breathing of 2 days duration.  Chronic cough, but she reports over the past few days her cough has been significantly worse and productive.  She reports chronic left lower extremity swelling, but over the past week she has noted worsening swelling to both lower extremities.  She reports abdominal bloating.  She reports compliance with her Lasix, once daily, but over the past 3 days she has added a second dose at night which is usually as needed. No chest pain. She does not check her weight daily, but reports her baseline weight is about 1 70-1 73.  ED Course: Temperature 97.8.  Heart rate 58-100.  Respirate rate 14-18.  Blood pressure systolic 034-742.  O2 sat 89% on room air. Troponin 11 > 10. BNP 297. WBC 6.5 Chest x-ray shows loculated pleural fluid, also suggest atelectasis or infection in right base. Lasix 40 mg x 1 , DuoNebs and azithromycin and ceftriaxone started.  Hospitalist to admit for acute respiratory failure.  Review of Systems: As per HPI all other systems reviewed and negative.  Past Medical History:  Diagnosis Date   Anemia    Asthma    Atrial fibrillation ([place])    Breast carcinoma ([place]) 1995   5956   Cardioembolic stroke ([place]) 12/8754   Right frontal in 01/2012; normal carotid ultrasound; possible LAA thrombus by TEE; virtual complete neurologic recovery   Chronic kidney disease, stage 2, mildly decreased GFR    GFR of approximately 60   Depression     Diabetes mellitus without complication ([place])    Diverticulosis of colon (without mention of hemorrhage) 2012   Dr. Laural Golden   Fasting hyperglycemia    120 fasting   Gastroesophageal reflux disease    Gastroparesis    Hemorrhoids    Hyperlipidemia    Hypertension    pt denies 05/30/13     Dr Kathe Mariner   Hyponatremia    Rheumatic heart disease    a. 07/2013 Echo: Ef 55-60%, Mod AS/AI, Mod-Sev MS, sev dil LA, PASP 58;  b. 07/2013 TEE: EF 35-40%, mild-mod AS/AI, mod-sev MS, LA smoke;  c. 07/2013 Cath: elev R heart pressures, nl cors.   Shortness of breath     Past Surgical History:  Procedure Laterality Date   AORTIC VALVE REPLACEMENT N/A 07/18/2013   Procedure: AORTIC VALVE REPLACEMENT (AVR);  Surgeon: Gaye Pollack, MD;  Location: [facility];  Service: Open Heart Surgery;  Laterality: N/A;   BACK SURGERY     BREAST LUMPECTOMY Right 1995   CARDIAC CATHETERIZATION     CARDIOVERSION N/A 07/26/2013   Procedure: CARDIOVERSION;  Surgeon: Fay Records, MD;  Location: [facility];  Service: Cardiovascular;  Laterality: N/A;   COLONOSCOPY  2012   Negative screening procedure   DILATION AND CURETTAGE OF UTERUS     ESOPHAGEAL MANOMETRY N/A 06/17/2013   Procedure: ESOPHAGEAL MANOMETRY (EM);  Surgeon: Sable Feil, MD;  Location: [facility] ENDOSCOPY;  Service: Endoscopy;  Laterality: N/A;   INTRAOPERATIVE TRANSESOPHAGEAL ECHOCARDIOGRAM N/A 07/18/2013  Procedure: INTRAOPERATIVE TRANSESOPHAGEAL ECHOCARDIOGRAM;  Surgeon: Gaye Pollack, MD;  Location: [facility] OR;  Service: Open Heart Surgery;  Laterality: N/A;   LEFT AND RIGHT HEART CATHETERIZATION WITH CORONARY ANGIOGRAM N/A 07/10/2013   Procedure: LEFT AND RIGHT HEART CATHETERIZATION WITH CORONARY ANGIOGRAM;  Surgeon: Burnell Blanks, MD;  Location: [facility] CATH LAB;  Service: Cardiovascular;  Laterality: N/A;   LUMBAR LAMINECTOMY/DECOMPRESSION MICRODISCECTOMY Right 05/13/2014   Procedure: LUMBAR LAMINECTOMY/DECOMPRESSION MICRODISCECTOMY 1 LEVEL   lumbar four/five;  Surgeon: Faythe Ghee, MD;  Location: [facility] NEURO ORS;  Service: Neurosurgery;  Laterality: Right;   MITRAL VALVE REPLACEMENT N/A 07/18/2013   Procedure: MITRAL VALVE (MV) REPLACEMENT;  Surgeon: Gaye Pollack, MD;  Location: [facility] OR;  Service: Open Heart Surgery;  Laterality: N/A;   MITRAL VALVE [place], closed mitral valvulotomy by finger fracture   TEE WITHOUT CARDIOVERSION  01/24/2012   Procedure: TRANSESOPHAGEAL ECHOCARDIOGRAM (TEE);  Surgeon: Lelon Perla, MD;  Location: [facility] ENDOSCOPY;  Service: Cardiovascular;  Laterality: N/A;   TEE WITHOUT CARDIOVERSION N/A 07/11/2013   Procedure: TRANSESOPHAGEAL ECHOCARDIOGRAM (TEE);  Surgeon: Thayer Headings, MD;  Location: [facility];  Service: Cardiovascular;  Laterality: N/A;   TEE WITHOUT CARDIOVERSION N/A 07/26/2013   Procedure: TRANSESOPHAGEAL ECHOCARDIOGRAM (TEE);  Surgeon: Fay Records, MD;  Location[facility] ENDOSCOPY;  Service: Cardiovascular;  Laterality: N/A;   [place]     reports that she has never smoked. She has never used smokeless tobacco. She reports that she does not drink alcohol and does not use drugs.  Allergies  Allergen Reactions   Amiodarone Rash    Family History  Problem Relation Age of Onset   Heart disease Brother 10       MI   Rheum arthritis Maternal Grandmother    Asthma Maternal Grandfather    Hypothyroidism Mother    Diabetes Sister    Cirrhosis Father     Prior to Admission medications   Medication Sig Start Date End Date Taking? Authorizing Provider  albuterol (PROVENTIL) (2.5 MG/3ML) 0.083% nebulizer solution Take 3 mLs (2.5 mg total) by nebulization every 4 (four) hours as needed for wheezing or shortness of breath. 07/22/21   Tanda Rockers, MD  amoxicillin (AMOXIL) 500 MG capsule Take 500 mg by mouth 3 (three) times daily. Patient not taking: Reported on 03/16/2022 02/02/22   [provider]  atorvastatin (LIPITOR) 40 MG tablet TAKE ONE TABLET BY  MOUTH ONCE DAILY. Patient taking differently: Take 40 mg by mouth daily. 10/18/21   Arnoldo Lenis, MD  budesonide (PULMICORT) 0.25 MG/2ML nebulizer solution One 0.25 mg  twice daily  along with albuterol Patient taking differently: Take 0.25 mg by nebulization daily. Take along with albuterol 12/13/21   Tanda Rockers, MD  cetirizine (ZYRTEC) 10 MG tablet Take 10 mg by mouth daily.    [provider]  chlorhexidine (PERIDEX) 0.12 % solution Use as directed 15 mLs in the mouth or throat 2 (two) times daily. 02/08/22   [provider]  diclofenac Sodium (VOLTAREN) 1 % GEL Apply 2 g topically 4 (four) times daily. 10/26/21   Volney American, PA-C  diltiazem (CARDIZEM CD) 180 MG 24 hr capsule Take 1 capsule (180 mg total) by mouth daily. 08/03/13   Collins, Gina L, PA-C  escitalopram (LEXAPRO) 10 MG tablet Take 10 mg by mouth daily.  04/10/15   [provider]  famotidine (PEPCID) 20 MG tablet TAKE 1 TABLET AFTER SUPPER Patient taking differently:  Take 20 mg by mouth at bedtime. 09/20/21   Tanda Rockers, MD  fluticasone (FLONASE) 50 MCG/ACT nasal spray Place 1 spray into both nostrils daily as needed for allergies.  09/05/13   Arnoldo Lenis, MD  furosemide (LASIX) 20 MG tablet TAKE (1) TABLET BY MOUTH ONCE DAILY FOR SWELLING. 03/17/22   Arnoldo Lenis, MD  gabapentin (NEURONTIN) 100 MG capsule TAKE 1 CAPSULE BY MOUTH FOUR TIMES DAILY. Patient taking differently: Take 100 mg by mouth 4 (four) times daily. 02/22/22   Tanda Rockers, MD  levothyroxine (SYNTHROID) 100 MCG tablet Take 100 mcg by mouth daily before breakfast.    [provider]  metFORMIN (GLUCOPHAGE) 500 MG tablet Take 1 tablet (500 mg total) by mouth 2 (two) times daily with a meal. 08/03/13   Collins, Gina L, PA-C  metoprolol tartrate (LOPRESSOR) 25 MG tablet Take 1.5 tablets (37.5 mg total) by mouth 2 (two) times daily. 08/13/21 04/12/22  Arnoldo Lenis, MD  Multiple Vitamin  (MULITIVITAMIN WITH MINERALS) TABS Take 1 tablet by mouth daily.    [provider]  olmesartan (BENICAR) 5 MG tablet Take 5 mg by mouth daily.    [provider]  ondansetron (ZOFRAN) 4 MG tablet Take 1 tablet (4 mg total) by mouth every 6 (six) hours as needed for nausea. Patient not taking: Reported on 03/16/2022 06/04/21   Allie Bossier, MD  ondansetron (ZOFRAN) 4 MG tablet Take 1 tablet (4 mg total) by mouth every 8 (eight) hours as needed for nausea or vomiting. 02/24/22   Cherlynn June B, PA-C  ondansetron (ZOFRAN-ODT) 4 MG disintegrating tablet '4mg'$  ODT q4 hours prn nausea/vomit Patient not taking: Reported on 03/16/2022 11/11/21   Mesner, Corene Cornea, MD  pantoprazole (PROTONIX) 40 MG tablet TAKE 1 TABLET 30 TO 60 MINUTES BEFORE FIRST MEAL OF THE DAY. Patient taking differently: Take 40 mg by mouth daily before breakfast. 02/07/22   Tanda Rockers, MD  potassium chloride SA (K-DUR,KLOR-CON) 20 MEQ tablet Take 20 mEq by mouth daily. 08/14/13   Gaye Pollack, MD  predniSONE (DELTASONE) 10 MG tablet Take  4 each am x 2 days,   2 each am x 2 days,  1 each am x 2 days and stop Patient not taking: Reported on 03/16/2022 12/13/21   Tanda Rockers, MD  PREVIDENT 5000 BOOSTER PLUS 1.1 % PSTE Place 1 application  onto teeth daily. 05/28/21   [provider]  warfarin (COUMADIN) 2.5 MG tablet TAKE AS DIRECTED BY COUMADIN CLINIC. Patient taking differently: Take 2.5 mg by mouth daily at 4 PM. 03/09/22   Arnoldo Lenis, MD    Physical Exam: Vitals:   03/31/22 1200 03/31/22 1310 03/31/22 1340 03/31/22 1400  BP: 139/64  (!) 148/72 (!) 146/81  Pulse: (!) 58  71 70  Resp: '16  18 14  '$ Temp:      TempSrc:      SpO2: 93% 98% 98% 98%  Weight:      Height:        Constitutional: NAD, calm, comfortable Vitals:   03/31/22 1200 03/31/22 1310 03/31/22 1340 03/31/22 1400  BP: 139/64  (!) 148/72 (!) 146/81  Pulse: (!) 58  71 70  Resp: '16  18 14  '$ Temp:      TempSrc:      SpO2: 93%  98% 98% 98%  Weight:      Height:       Eyes: PERRL, lids and conjunctivae normal ENMT: Mucous membranes  are moist. .  Neck: normal, supple, no masses, no thyromegaly Respiratory: Crackles bilateral bases worse on the right, no wheezes or rhonchi, normal respiratory effort. No accessory muscle use.  Cardiovascular: Regular rate and rhythm, no murmurs / rubs / gallops.  1+ pitting extremity edema to mid leg bilaterally.  Lower extremities warm Abdomen: no tenderness, no masses palpated. No hepatosplenomegaly. Bowel sounds positive.  Musculoskeletal: no clubbing / cyanosis. No joint deformity upper and lower extremities.  Skin: no rashes, lesions, ulcers. No induration Neurologic: No apparent cranial nerve abnormality moving all extremities spontaneously.  Psychiatric: Normal judgment and insight. Alert and oriented x 3. Normal mood.   Labs on Admission: I have personally reviewed following labs and imaging studies  CBC: Recent Labs  Lab 03/31/22 1040  WBC 6.5  HGB 12.2  HCT 41.2  MCV 94.7  PLT 384   Basic Metabolic Panel: Recent Labs  Lab 03/31/22 1040  NA 138  K 4.0  CL 104  CO2 27  GLUCOSE 117*  BUN 12  CREATININE 1.00  CALCIUM 9.1  MG 1.9   GFR: Estimated Creatinine Clearance: 47.2 mL/min (by C-G formula based on SCr of 1 mg/dL). Liver Function Tests: Recent Labs  Lab 03/31/22 1040  AST 24  ALT 20  ALKPHOS 68  BILITOT 0.8  PROT 7.6  ALBUMIN 4.1   Recent Labs  Lab 03/31/22 1040  LIPASE 52*   No results for input(s): "AMMONIA" in the last 168 hours. Coagulation Profile: Recent Labs  Lab 03/31/22 1040  INR 2.7*    Radiological Exams on Admission: DG Chest Portable 1 View  Result Date: 03/31/2022 CLINICAL DATA:  Shortness of breath for 2 days EXAM: PORTABLE CHEST 1 VIEW COMPARISON:  Chest radiograph 02/25/2019 FINDINGS: Median sternotomy wires and cardiac valve prostheses are stable. The heart is enlarged, not significantly changed. The upper  mediastinal contours are stable. There is vascular prominence with no definite overt edema. Opacity projecting over the right lateral lung may again reflect loculated pleural fluid, increased since the prior study. Patchy opacities in the right base may reflect atelectasis or developing infection. There is no significant pleural effusion on the left. There is no pneumothorax. Left apical scarring is unchanged. There is no acute osseous abnormality. IMPRESSION: 1. Opacity over the right lateral lung is slightly increased from prior, and may again reflect loculated pleural fluid. 2. Patchy opacities in the right base may reflect atelectasis or developing infection. 3. Unchanged cardiomegaly. Electronically Signed   By: Valetta Mole M.D.   On: 03/31/2022 11:04    EKG: Independently reviewed.  Atrial flutter rate 87, QTc 491.  No significant change from prior.  Assessment/Plan Principal Problem:   Acute respiratory failure with hypoxia (HCC) Active Problems:   Acute congestive heart failure (HCC)   Pneumonia   Essential hypertension   Chronic anticoagulation   Cardioembolic stroke [facility])   Atrial fibrillation (HCC)   Status post aortic valve and mitral valve replacement    Assessment and Plan: * Acute respiratory failure with hypoxia (HCC) O2 sats down to 89% on room air.  She reports dyspnea and cough.  Portable chest x-ray suggesting right base atelectasis or developing infection, also loculated pleural fluid in right lateral lung slightly increased from prior.  Also possible component of congestive heart failure. -Supplemental O2 -IV antibiotics  Pneumonia Reports dyspnea and worsening productive cough.  Afebrile without leukocytosis.  Rules out for sepsis.  Chest x-ray suggesting atelectasis versus pneumonia in right base.  Also has an opacity  over right lateral lung base slightly increased from prior and may reflect loculated pleural fluid. -IV ceftriaxone and doxycycline (borderline  prolonged QTc 491) -Obtain CT chest W contrast to better characterize loculation  Acute congestive heart failure ([place]) As yet unspecified type CHF.  Bilateral pitting pedal edema of 1 week, abdominal bloating.  Weight today 180lbs, baseline weights 170- 173.  Chest x-ray without congestion or edema.  BNP elevated at 297 back to baseline of low 100s.  Echo 2021 EF of 60-65%, indeterminate LV parameters, showed replaced aortic and mitral valves.  Denies dietary indiscretion.  Has been compliant with home Lasix '20mg'$  daily, took an extra dose over the past 3 days. -Obtain updated echocardiogram -IV Lasix 40 twice daily -Strict input output, daily weights -Daily BMP -   Type 2 diabetes mellitus treated without insulin (HCC) - HgbA1c - SSI- S - Hold Metformin  Atrial fibrillation (HCC) Rate controlled and on chronic anticoagulation with warfarin.  INR 2.7, therapeutic -Resume warfarin per pharmacy -Daily INR -Resume metoprolol, Cardizem  Essential hypertension Stable. -Resume Cardizem, metoprolol,  -Hold olmesartan for contrast exposure -IV Lasix -Resume home K-Dur supplementation 74mq daily   DVT prophylaxis: Warfarin Code Status: Full Family Communication: Spouse at bedside Disposition Plan: ~ /> 2 days Consults called: None Admission status: Inpt tele  I certify that at the point of admission it is my clinical judgment that the patient will require inpatient hospital care spanning beyond 2 midnights from the point of admission due to high intensity of service, high risk for further deterioration and high frequency of surveillance required.   Author: EBethena Roys MD 03/31/2022 5:53 PM  For on call review [email or web address]

## 2022-03-31 NOTE — Assessment & Plan Note (Addendum)
Stable. -Resume Cardizem, metoprolol,  -Hold olmesartan for contrast exposure -IV Lasix -Resume home K-Dur supplementation 59mq daily

## 2022-03-31 NOTE — Assessment & Plan Note (Addendum)
Rate controlled and on chronic anticoagulation with warfarin.  INR 2.7, therapeutic -Resume warfarin per pharmacy -Daily INR -Resume metoprolol, Cardizem

## 2022-03-31 NOTE — ED Notes (Signed)
Pt's SP 02 bounced between 88-89 while ambulating to and from bathroom. PA notified

## 2022-03-31 NOTE — Assessment & Plan Note (Addendum)
Reports dyspnea and worsening productive cough.  Afebrile without leukocytosis.  Rules out for sepsis.  Chest x-ray suggesting atelectasis versus pneumonia in right base.  Also has an opacity over right lateral lung base slightly increased from prior and may reflect loculated pleural fluid. -IV ceftriaxone and doxycycline (borderline prolonged QTc 491) -Obtain CT chest W contrast to better characterize loculation

## 2022-03-31 NOTE — ED Provider Notes (Signed)
[facility] EMERGENCY DEPARTMENT Provider Note   CSN: [id number] Arrival date & time: 03/31/22  0944     History  Chief Complaint  Patient presents with   Shortness of Breath    LUSINE CORLETT is a 76 y.o. female with a history including hypertension, hyperlipidemia, asthma, atrial fibrillation on Coumadin, COPD and type 2 diabetes presenting for evaluation of increasing shortness of breath over the past 2 days.  She has a pulse ox meter at home and since yesterday her oxygen level has been fairly consistently reading 88 to 89% on room air.  She does not use home oxygen.  She denies chest pain, also denies fevers or chills but does endorse having a cough which has been productive of a yellow sputum.  She has been consistent with her home medications including her Pulmicort, she is also on L butyryl per nebulizer every 12 hours, her last dose was taken this morning around 6 AM which she states has not improved her shortness of breath.  She denies wheezing.  She is also on Lasix 20 mg daily and has noted increased lower extremity edema.  Worsening orthopnea.  The history is provided by the patient.       Home Medications Prior to Admission medications   Medication Sig Start Date End Date Taking? Authorizing Provider  albuterol (PROVENTIL) (2.5 MG/3ML) 0.083% nebulizer solution Take 3 mLs (2.5 mg total) by nebulization every 4 (four) hours as needed for wheezing or shortness of breath. 07/22/21   Tanda Rockers, MD  amoxicillin (AMOXIL) 500 MG capsule Take 500 mg by mouth 3 (three) times daily. Patient not taking: Reported on 03/16/2022 02/02/22   [provider]  atorvastatin (LIPITOR) 40 MG tablet TAKE ONE TABLET BY MOUTH ONCE DAILY. Patient taking differently: Take 40 mg by mouth daily. 10/18/21   Arnoldo Lenis, MD  budesonide (PULMICORT) 0.25 MG/2ML nebulizer solution One 0.25 mg  twice daily  along with albuterol Patient taking differently: Take 0.25 mg by nebulization  daily. Take along with albuterol 12/13/21   Tanda Rockers, MD  cetirizine (ZYRTEC) 10 MG tablet Take 10 mg by mouth daily.    [provider]  chlorhexidine (PERIDEX) 0.12 % solution Use as directed 15 mLs in the mouth or throat 2 (two) times daily. 02/08/22   [provider]  diclofenac Sodium (VOLTAREN) 1 % GEL Apply 2 g topically 4 (four) times daily. 10/26/21   Volney American, PA-C  diltiazem (CARDIZEM CD) 180 MG 24 hr capsule Take 1 capsule (180 mg total) by mouth daily. 08/03/13   Collins, Gina L, PA-C  escitalopram (LEXAPRO) 10 MG tablet Take 10 mg by mouth daily.  04/10/15   [provider]  famotidine (PEPCID) 20 MG tablet TAKE 1 TABLET AFTER SUPPER Patient taking differently: Take 20 mg by mouth at bedtime. 09/20/21   Tanda Rockers, MD  fluticasone (FLONASE) 50 MCG/ACT nasal spray Place 1 spray into both nostrils daily as needed for allergies.  09/05/13   Arnoldo Lenis, MD  furosemide (LASIX) 20 MG tablet TAKE (1) TABLET BY MOUTH ONCE DAILY FOR SWELLING. 03/17/22   Arnoldo Lenis, MD  gabapentin (NEURONTIN) 100 MG capsule TAKE 1 CAPSULE BY MOUTH FOUR TIMES DAILY. Patient taking differently: Take 100 mg by mouth 4 (four) times daily. 02/22/22   Tanda Rockers, MD  levothyroxine (SYNTHROID) 100 MCG tablet Take 100 mcg by mouth daily before breakfast.    [provider]  metFORMIN (GLUCOPHAGE) 500  MG tablet Take 1 tablet (500 mg total) by mouth 2 (two) times daily with a meal. 08/03/13   Collins, Gina L, PA-C  metoprolol tartrate (LOPRESSOR) 25 MG tablet Take 1.5 tablets (37.5 mg total) by mouth 2 (two) times daily. 08/13/21 04/12/22  Arnoldo Lenis, MD  Multiple Vitamin (MULITIVITAMIN WITH MINERALS) TABS Take 1 tablet by mouth daily.    [provider]  olmesartan (BENICAR) 5 MG tablet Take 5 mg by mouth daily.    [provider]  ondansetron (ZOFRAN) 4 MG tablet Take 1 tablet (4 mg total) by mouth every 6 (six) hours as  needed for nausea. Patient not taking: Reported on 03/16/2022 06/04/21   Allie Bossier, MD  ondansetron (ZOFRAN) 4 MG tablet Take 1 tablet (4 mg total) by mouth every 8 (eight) hours as needed for nausea or vomiting. 02/24/22   Cherlynn June B, PA-C  ondansetron (ZOFRAN-ODT) 4 MG disintegrating tablet '4mg'$  ODT q4 hours prn nausea/vomit Patient not taking: Reported on 03/16/2022 11/11/21   Mesner, Corene Cornea, MD  pantoprazole (PROTONIX) 40 MG tablet TAKE 1 TABLET 30 TO 60 MINUTES BEFORE FIRST MEAL OF THE DAY. Patient taking differently: Take 40 mg by mouth daily before breakfast. 02/07/22   Tanda Rockers, MD  potassium chloride SA (K-DUR,KLOR-CON) 20 MEQ tablet Take 20 mEq by mouth daily. 08/14/13   Gaye Pollack, MD  predniSONE (DELTASONE) 10 MG tablet Take  4 each am x 2 days,   2 each am x 2 days,  1 each am x 2 days and stop Patient not taking: Reported on 03/16/2022 12/13/21   Tanda Rockers, MD  PREVIDENT 5000 BOOSTER PLUS 1.1 % PSTE Place 1 application  onto teeth daily. 05/28/21   [provider]  warfarin (COUMADIN) 2.5 MG tablet TAKE AS DIRECTED BY COUMADIN CLINIC. Patient taking differently: Take 2.5 mg by mouth daily at 4 PM. 03/09/22   Branch, Alphonse Guild, MD      Allergies    Amiodarone    Review of Systems   Review of Systems  Constitutional:  Negative for chills and fever.  HENT:  Negative for congestion and sore throat.   Eyes: Negative.   Respiratory:  Positive for cough and shortness of breath. Negative for chest tightness.   Cardiovascular:  Positive for leg swelling. Negative for chest pain and palpitations.  Gastrointestinal:  Negative for abdominal pain, nausea and vomiting.  Genitourinary: Negative.   Musculoskeletal:  Negative for arthralgias, joint swelling and neck pain.  Skin: Negative.  Negative for rash and wound.  Neurological:  Negative for dizziness, weakness, light-headedness, numbness and headaches.  Psychiatric/Behavioral: Negative.    All other systems  reviewed and are negative.   Physical Exam Updated Vital Signs BP (!) 146/81   Pulse 70   Temp 97.8 F (36.6 C) (Oral)   Resp 14   Ht 5' 2.5" (1.588 m)   Wt 81.6 kg   SpO2 98%   BMI 32.40 kg/m  Physical Exam Vitals and nursing note reviewed.  Constitutional:      Appearance: She is well-developed.  HENT:     Head: Normocephalic and atraumatic.  Eyes:     Conjunctiva/sclera: Conjunctivae normal.  Cardiovascular:     Rate and Rhythm: Normal rate and regular rhythm.     Heart sounds: Normal heart sounds.  Pulmonary:     Effort: Pulmonary effort is normal.     Breath sounds: Examination of the right-lower field reveals decreased breath sounds. Decreased breath sounds  present. No wheezing.  Chest:     Chest wall: No tenderness.  Abdominal:     General: Bowel sounds are normal.     Palpations: Abdomen is soft.     Tenderness: There is no abdominal tenderness.  Musculoskeletal:        General: Normal range of motion.     Cervical back: Normal range of motion.     Right lower leg: Edema present.     Left lower leg: Edema present.  Skin:    General: Skin is warm and dry.  Neurological:     General: No focal deficit present.     Mental Status: She is alert.     ED Results / Procedures / Treatments   Labs (all labs ordered are listed, but only abnormal results are displayed) Labs Reviewed  BRAIN NATRIURETIC PEPTIDE - Abnormal; Notable for the following components:      Result Value   B Natriuretic Peptide 297.0 (*)    All other components within normal limits  CBC - Abnormal; Notable for the following components:   MCHC 29.6 (*)    All other components within normal limits  COMPREHENSIVE METABOLIC PANEL - Abnormal; Notable for the following components:   Glucose, Bld 117 (*)    GFR, Estimated 58 (*)    All other components within normal limits  LIPASE, BLOOD - Abnormal; Notable for the following components:   Lipase 52 (*)    All other components within normal  limits  PROTIME-INR - Abnormal; Notable for the following components:   Prothrombin Time 28.4 (*)    INR 2.7 (*)    All other components within normal limits  SARS CORONAVIRUS 2 BY RT PCR  MAGNESIUM  TROPONIN I (HIGH SENSITIVITY)  TROPONIN I (HIGH SENSITIVITY)    EKG None  Radiology DG Chest Portable 1 View  Result Date: 03/31/2022 CLINICAL DATA:  Shortness of breath for 2 days EXAM: PORTABLE CHEST 1 VIEW COMPARISON:  Chest radiograph 02/25/2019 FINDINGS: Median sternotomy wires and cardiac valve prostheses are stable. The heart is enlarged, not significantly changed. The upper mediastinal contours are stable. There is vascular prominence with no definite overt edema. Opacity projecting over the right lateral lung may again reflect loculated pleural fluid, increased since the prior study. Patchy opacities in the right base may reflect atelectasis or developing infection. There is no significant pleural effusion on the left. There is no pneumothorax. Left apical scarring is unchanged. There is no acute osseous abnormality. IMPRESSION: 1. Opacity over the right lateral lung is slightly increased from prior, and may again reflect loculated pleural fluid. 2. Patchy opacities in the right base may reflect atelectasis or developing infection. 3. Unchanged cardiomegaly. Electronically Signed   By: Valetta Mole M.D.   On: 03/31/2022 11:04    Procedures Procedures    Medications Ordered in ED Medications  ipratropium-albuterol (DUONEB) 0.5-2.5 (3) MG/3ML nebulizer solution 3 mL (3 mLs Nebulization Given 03/31/22 1309)  cefTRIAXone (ROCEPHIN) 1 g in sodium chloride 0.9 % 100 mL IVPB (0 g Intravenous Stopped 03/31/22 1503)  furosemide (LASIX) injection 40 mg (40 mg Intravenous Given 03/31/22 1510)  azithromycin (ZITHROMAX) tablet 500 mg (500 mg Oral Given 03/31/22 1510)    ED Course/ Medical Decision Making/ A&P                           Medical Decision Making Patient with increased shortness of  breath, probably multifactorial, first I think she has  an acute exacerbation of her CHF, she has increasing orthopnea and peripheral edema, she has an elevated BNP at 297.  There does appear to be a loculated pleural effusion which was there previously, slightly increased today.  There is also concern for opacities in the right base possibly developing pneumonia.  She was ambulated in the department after receiving a neb treatment, she has also received Lasix 40 mg IV, her oxygen saturation drops to 88% and she becomes fairly short of breath with minimal exertion.  She will need admission for further treatment of these conditions.  She was given IV dose of Rocephin, p.o. Zithromax to cover her for possible CAP.  Amount and/or Complexity of Data Reviewed Labs: ordered.    Details: Labs are significant for a BNP of 297.  Her troponins are stable.  Mild bump in her lipase at 52, her INR is therapeutic at 2.7. Radiology: ordered.    Details: Per above Discussion of management or test interpretation with external provider(s): Call placed to hospitalist service for admission. Discussed with Dr. Denton Brick who accepts pt for admission.  Risk Prescription drug management.           Final Clinical Impression(s) / ED Diagnoses Final diagnoses:  Acute on chronic congestive heart failure, unspecified heart failure type [facility])  Community acquired pneumonia of right lower lobe of lung    Rx / DC Orders ED Discharge Orders     None         Landis Martins 03/31/22 1537    Milton Ferguson, MD 04/01/22 [phone]

## 2022-03-31 NOTE — ED Triage Notes (Signed)
Pt c/o SOB x 2 days. O2 sat 89% on RA. Denies pain.

## 2022-03-31 NOTE — Assessment & Plan Note (Signed)
O2 sats down to 89% on room air.  She reports dyspnea and cough.  Portable chest x-ray suggesting right base atelectasis or developing infection, also loculated pleural fluid in right lateral lung slightly increased from prior.  Also possible component of congestive heart failure. -Supplemental O2 -IV antibiotics

## 2022-03-31 NOTE — Assessment & Plan Note (Signed)
-   HgbA1c - SSI- S - Hold Metformin

## 2022-03-31 NOTE — Progress Notes (Signed)
[place] for  Warfarin Indication: atrial fibrillation / MVR  Allergies  Allergen Reactions   Amiodarone Rash    Patient Measurements: Height: 5' 2.5" (158.8 cm) Weight: 81.6 kg (180 lb) IBW/kg (Calculated) : 51.25   Vital Signs: Temp: 97.6 F (36.4 C) (06/29 1700) Temp Source: Oral (06/29 1700) BP: 142/87 (06/29 1700) Pulse Rate: 100 (06/29 1700)  Labs: Recent Labs    03/31/22 1040 03/31/22 1309  HGB 12.2  --   HCT 41.2  --   PLT 173  --   LABPROT 28.4*  --   INR 2.7*  --   CREATININE 1.00  --   TROPONINIHS 11 10    Estimated Creatinine Clearance: 47.2 mL/min (by C-G formula based on SCr of 1 mg/dL).   Assessment: 76 year old female on warfarin PTA for mitral valve and a-fib  Dose PTA 3.75 mg every Thursday, Sunday, 2.5 mg other days  INR therapeutic on admission at 2.7  Goal of Therapy:  INR 2.5 to 3.5 Monitor platelets by anticoagulation protocol: Yes   Plan:  Resume home dose Daily INR for now  Thank you Anette Guarneri, PharmD  03/31/2022,6:00 PM

## 2022-04-01 ENCOUNTER — Observation Stay (HOSPITAL_COMMUNITY): Payer: Medicare PPO

## 2022-04-01 DIAGNOSIS — Z79890 Hormone replacement therapy (postmenopausal): Secondary | ICD-10-CM | POA: Diagnosis not present

## 2022-04-01 DIAGNOSIS — Z833 Family history of diabetes mellitus: Secondary | ICD-10-CM | POA: Diagnosis not present

## 2022-04-01 DIAGNOSIS — J449 Chronic obstructive pulmonary disease, unspecified: Secondary | ICD-10-CM | POA: Diagnosis present

## 2022-04-01 DIAGNOSIS — Z952 Presence of prosthetic heart valve: Secondary | ICD-10-CM | POA: Diagnosis not present

## 2022-04-01 DIAGNOSIS — I509 Heart failure, unspecified: Secondary | ICD-10-CM | POA: Diagnosis not present

## 2022-04-01 DIAGNOSIS — Z825 Family history of asthma and other chronic lower respiratory diseases: Secondary | ICD-10-CM | POA: Diagnosis not present

## 2022-04-01 DIAGNOSIS — Z7952 Long term (current) use of systemic steroids: Secondary | ICD-10-CM | POA: Diagnosis not present

## 2022-04-01 DIAGNOSIS — Z20822 Contact with and (suspected) exposure to covid-19: Secondary | ICD-10-CM | POA: Diagnosis present

## 2022-04-01 DIAGNOSIS — I4891 Unspecified atrial fibrillation: Secondary | ICD-10-CM | POA: Diagnosis present

## 2022-04-01 DIAGNOSIS — Z853 Personal history of malignant neoplasm of breast: Secondary | ICD-10-CM | POA: Diagnosis not present

## 2022-04-01 DIAGNOSIS — I5033 Acute on chronic diastolic (congestive) heart failure: Secondary | ICD-10-CM | POA: Diagnosis present

## 2022-04-01 DIAGNOSIS — I130 Hypertensive heart and chronic kidney disease with heart failure and stage 1 through stage 4 chronic kidney disease, or unspecified chronic kidney disease: Secondary | ICD-10-CM | POA: Diagnosis present

## 2022-04-01 DIAGNOSIS — Z9181 History of falling: Secondary | ICD-10-CM | POA: Diagnosis not present

## 2022-04-01 DIAGNOSIS — E876 Hypokalemia: Secondary | ICD-10-CM | POA: Diagnosis present

## 2022-04-01 DIAGNOSIS — E1122 Type 2 diabetes mellitus with diabetic chronic kidney disease: Secondary | ICD-10-CM | POA: Diagnosis present

## 2022-04-01 DIAGNOSIS — Z793 Long term (current) use of hormonal contraceptives: Secondary | ICD-10-CM | POA: Diagnosis not present

## 2022-04-01 DIAGNOSIS — Z888 Allergy status to other drugs, medicaments and biological substances status: Secondary | ICD-10-CM | POA: Diagnosis not present

## 2022-04-01 DIAGNOSIS — Z7901 Long term (current) use of anticoagulants: Secondary | ICD-10-CM | POA: Diagnosis not present

## 2022-04-01 DIAGNOSIS — K219 Gastro-esophageal reflux disease without esophagitis: Secondary | ICD-10-CM | POA: Diagnosis present

## 2022-04-01 DIAGNOSIS — Z8249 Family history of ischemic heart disease and other diseases of the circulatory system: Secondary | ICD-10-CM | POA: Diagnosis not present

## 2022-04-01 DIAGNOSIS — J9601 Acute respiratory failure with hypoxia: Secondary | ICD-10-CM | POA: Diagnosis present

## 2022-04-01 DIAGNOSIS — Z8261 Family history of arthritis: Secondary | ICD-10-CM | POA: Diagnosis not present

## 2022-04-01 DIAGNOSIS — N182 Chronic kidney disease, stage 2 (mild): Secondary | ICD-10-CM | POA: Diagnosis present

## 2022-04-01 DIAGNOSIS — E1143 Type 2 diabetes mellitus with diabetic autonomic (poly)neuropathy: Secondary | ICD-10-CM | POA: Diagnosis present

## 2022-04-01 DIAGNOSIS — Z8673 Personal history of transient ischemic attack (TIA), and cerebral infarction without residual deficits: Secondary | ICD-10-CM | POA: Diagnosis not present

## 2022-04-01 LAB — GLUCOSE, CAPILLARY
Glucose-Capillary: 119 mg/dL — ABNORMAL HIGH (ref 70–99)
Glucose-Capillary: 167 mg/dL — ABNORMAL HIGH (ref 70–99)
Glucose-Capillary: 170 mg/dL — ABNORMAL HIGH (ref 70–99)
Glucose-Capillary: 144 mg/dL — ABNORMAL HIGH (ref 70–99)

## 2022-04-01 LAB — BASIC METABOLIC PANEL
Anion gap: 10 (ref 5–15)
BUN: 14 mg/dL (ref 8–23)
CO2: 27 mmol/L (ref 22–32)
Calcium: 8.6 mg/dL — ABNORMAL LOW (ref 8.9–10.3)
Chloride: 105 mmol/L (ref 98–111)
Creatinine, Ser: 0.99 mg/dL (ref 0.44–1.00)
GFR, Estimated: 59 mL/min — ABNORMAL LOW (ref >60)
Glucose, Bld: 89 mg/dL (ref 70–99)
Potassium: 3.5 mmol/L (ref 3.5–5.1)
Sodium: 142 mmol/L (ref 135–145)

## 2022-04-01 LAB — ECHOCARDIOGRAM COMPLETE
AR max vel: 1.36 cm2
AV Area VTI: 1.34 cm2
AV Area mean vel: 1.39 cm2
AV Mean grad: 11.5 mmHg
AV Peak grad: 22.3 mmHg
Ao pk vel: 2.36 m/s
Area-P 1/2: 3.28 cm2
Calc EF: 58.2 %
Height: 62.5 in
MV VTI: 2.14 cm2
S' Lateral: 2.5 cm
Single Plane A2C EF: 61.2 %
Single Plane A4C EF: 50.9 %
Weight: 2663.16 oz

## 2022-04-01 LAB — CBC
HCT: 38.5 % (ref 36.0–46.0)
Hemoglobin: 11.8 g/dL — ABNORMAL LOW (ref 12.0–15.0)
MCH: 28.7 pg (ref 26.0–34.0)
MCHC: 30.6 g/dL (ref 30.0–36.0)
MCV: 93.7 fL (ref 80.0–100.0)
Platelets: 168 10*3/uL (ref 150–400)
RBC: 4.11 MIL/uL (ref 3.87–5.11)
RDW: 15.3 % (ref 11.5–15.5)
WBC: 6.1 10*3/uL (ref 4.0–10.5)
nRBC: 0 % (ref 0.0–0.2)

## 2022-04-01 LAB — PROCALCITONIN: Procalcitonin: <0.1 ng/mL

## 2022-04-01 LAB — PROTIME-INR
INR: 2.5 — ABNORMAL HIGH (ref 0.8–1.2)
Prothrombin Time: 26.8 seconds — ABNORMAL HIGH (ref 11.4–15.2)

## 2022-04-01 MED ORDER — LIVING BETTER WITH HEART FAILURE BOOK
Freq: Once | Status: AC
Start: 2022-04-01 — End: 2022-04-01

## 2022-04-01 MED ORDER — WARFARIN SODIUM 2 MG PO TABS
3.0000 mg | ORAL_TABLET | Freq: Once | ORAL | Status: AC
  Administered 2022-04-01: 3 mg via ORAL
  Filled 2022-04-01: qty 1

## 2022-04-01 NOTE — Evaluation (Signed)
Physical Therapy Evaluation Patient Details Name: [name] MRN: [id number] DOB: 01/10/1946 Today's Date: 04/01/2022  History of Present Illness  [name] is a 76 y.o. female with medical history significant for COPD and asthma, atrial fibrillation, stroke, aortic and mitral valve replacement, on chronic anticoagulation, diabetes mellitus.  Patient presented to the ED with reports of difficulty breathing of 2 days duration.  Chronic cough, but she reports over the past few days her cough has been significantly worse and productive.  She reports chronic left lower extremity swelling, but over the past week she has noted worsening swelling to both lower extremities.  She reports abdominal bloating.  She reports compliance with her Lasix, once daily, but over the past 3 days she has added a second dose at night which is usually as needed.   Clinical Impression  Patient presents supine in bed with spouse in room and on 2 LPM. Consents to PT evaluation. Patient placed on room air during evaluation. Patient is modified independent with bed mobility with extended time required but demonstrates good trunk control. Patient is modified independent with transfers demonstrating mild labored movement along with slow cadence and use of quad cane. Fair balance and coordination. Patient able to ambulate 40 feet in hallway with supervision and use of quad cane. Moderate gait deviations with mild shuffling and increase in R LE ER impacting overall balance gait pattern observed. Patient primarily limited by fatigue with SpO2 desat at 87% during gait assessment. Patient tolerated sitting up in chair after therapy on room air with vitals stabilized. Patient averaged 92% SpO2 during evaluation when vitals monitored. Patient will benefit from continued skilled physical therapy in hospital and recommended venue below to increase strength, balance, endurance for safe ADLs and gait.      Recommendations for follow up  therapy are one component of a multi-disciplinary discharge planning process, led by the attending physician.  Recommendations may be updated based on patient status, additional functional criteria and insurance authorization.  Follow Up Recommendations Home health PT      Assistance Recommended at Discharge PRN  Patient can return home with the following  A little help with walking and/or transfers;Help with stairs or ramp for entrance;Assistance with cooking/housework;Assist for transportation    Equipment Recommendations None recommended by PT  Recommendations for Other Services       Functional Status Assessment Patient has had a recent decline in their functional status and demonstrates the ability to make significant improvements in function in a reasonable and predictable amount of time.     Precautions / Restrictions Precautions Precautions: Fall Restrictions Weight Bearing Restrictions: No      Mobility  Bed Mobility Overal bed mobility: Modified Independent             General bed mobility comments: Modified independent with supine to sit. Increased time needed but good trunk control    Transfers Overall transfer level: Modified independent Equipment used: Quad cane               General transfer comment: Mild labored movement with slow cadence and use of quad cane. Fair balance and coordination    Ambulation/Gait Ambulation/Gait assistance: Supervision Gait Distance (Feet): 40 Feet Assistive device: Quad cane Gait Pattern/deviations: Step-to pattern, Decreased step length - right, Decreased step length - left, Decreased stride length, Narrow base of support, Shuffle Gait velocity: decreased     General Gait Details: Ambulates 40 feet in hallway with supervision assist and use of quad cane. Moderate  gait deviations with mild shuffling gait pattern and increase in R LE ER impacting balance and coordination. Primarily limited by fatigue and vitals  assessed  Stairs            Wheelchair Mobility    Modified Rankin (Stroke Patients Only)       Balance Overall balance assessment: Mild deficits observed, not formally tested                                           Pertinent Vitals/Pain Pain Assessment Pain Assessment: No/denies pain    Home Living Family/patient expects to be discharged to:: Private residence Living Arrangements: Spouse/significant other Available Help at Discharge: Family;Available PRN/intermittently;Available 24 hours/day Type of Home: House Home Access: Stairs to enter Entrance Stairs-Rails: Right;Left;Can reach both Entrance Stairs-Number of Steps: 4   Home Layout: Two level;Able to live on main level with bedroom/bathroom Home Equipment: Cane - quad;Shower seat;Grab bars - tub/shower;Toilet riser      Prior Function Prior Level of Function : Needs assist       Physical Assist : ADLs (physical)   ADLs (physical): IADLs Mobility Comments: Patient was able to ambulate at home and in the community independently with quad cane. Not currently driving ADLs Comments: Patient reports being mostly independent with ADL's with some assistance for cleaning from husband and 1x cleaning services     Hand Dominance   Dominant Hand: Right    Extremity/Trunk Assessment   Upper Extremity Assessment Upper Extremity Assessment: Overall WFL for tasks assessed    Lower Extremity Assessment Lower Extremity Assessment: Overall WFL for tasks assessed    Cervical / Trunk Assessment Cervical / Trunk Assessment: Normal  Communication   Communication: No difficulties  Cognition Arousal/Alertness: Awake/alert Behavior During Therapy: WFL for tasks assessed/performed Overall Cognitive Status: Within Functional Limits for tasks assessed                                          General Comments      Exercises     Assessment/Plan    PT Assessment Patient needs  continued PT services;All further PT needs can be met in the next venue of care  PT Problem List Decreased strength;Decreased activity tolerance;Decreased balance;Decreased mobility;Decreased coordination       PT Treatment Interventions DME instruction;Stair training;Functional mobility training;Therapeutic activities;Therapeutic exercise;Balance training    PT Goals (Current goals can be found in the Care Plan section)  Acute Rehab PT Goals Patient Stated Goal: return home PT Goal Formulation: With patient Time For Goal Achievement: 04/08/22 Potential to Achieve Goals: Good    Frequency Min 3X/week     Co-evaluation               AM-PAC PT "6 Clicks" Mobility  Outcome Measure Help needed turning from your back to your side while in a flat bed without using bedrails?: None Help needed moving from lying on your back to sitting on the side of a flat bed without using bedrails?: None Help needed moving to and from a bed to a chair (including a wheelchair)?: None Help needed standing up from a chair using your arms (e.g., wheelchair or bedside chair)?: A Little Help needed to walk in hospital room?: A Little Help needed climbing 3-5 steps with a railing? :   A Little 6 Click Score: 21    End of Session   Activity Tolerance: Patient tolerated treatment well;No increased pain;Patient limited by fatigue Patient left: in chair;with call bell/phone within reach;with family/visitor present Nurse Communication: Mobility status PT Visit Diagnosis: Unsteadiness on feet (R26.81);Other abnormalities of gait and mobility (R26.89);Muscle weakness (generalized) (M62.81)    Time: 1124-1146 PT Time Calculation (min) (ACUTE ONLY): 22 min   Charges:   PT Evaluation $PT Eval Moderate Complexity: 1 Mod PT Treatments $Therapeutic Activity: 8-22 mins        12:29 PM, 04/01/22  , S/PT   

## 2022-04-01 NOTE — Progress Notes (Addendum)
PROGRESS NOTE    SAMAIRA [name]  XNT:[id number] DOB: 1946-07-09 DOA: 03/31/2022 PCP: [name], MD   Brief Narrative:  [name] is a 76 y.o. female with medical history significant for COPD and asthma, atrial fibrillation, stroke, aortic and mitral valve replacement, on chronic anticoagulation, diabetes mellitus. Patient presented to the ED with reports of difficulty breathing of 2 days duration.  She was admitted with acute hypoxemic respiratory failure in the setting of volume overload.  Pneumonia was initially suspected, but does not appear to be present.   Assessment & Plan:   Principal Problem:   Acute respiratory failure with hypoxia (HCC) Active Problems:   Acute congestive heart failure (HCC)   Pneumonia   Essential hypertension   Chronic anticoagulation   Cardioembolic stroke (HCC)   Atrial fibrillation (HCC)   Status post aortic valve and mitral valve replacement   Type 2 diabetes mellitus treated without insulin (HCC)  Assessment and Plan:   Acute respiratory failure with hypoxia (HCC) O2 sats down to 89% on room air.  She reports dyspnea and cough.  Portable chest x-ray suggesting right base atelectasis or developing infection, also loculated pleural fluid in right lateral lung slightly increased from prior.  Also possible component of congestive heart failure. -Supplemental O2 -IV antibiotics discontinued as there is no sign of pneumonia and procalcitonin is low -Follow CBC -Wean oxygen as tolerated   Acute congestive heart failure ([place]) As yet unspecified type CHF.  Bilateral pitting pedal edema of 1 week, abdominal bloating.  Weight today 180lbs, baseline weights 170- 173.  Chest x-ray without congestion or edema.  BNP elevated at 297 back to baseline of low 100s.  Echo 2021 EF of 60-65%, indeterminate LV parameters, showed replaced aortic and mitral valves.  Denies dietary indiscretion.  Has been compliant with home Lasix '20mg'$  daily, took an extra dose  over the past 3 days. -Obtain updated echocardiogram -IV Lasix 40 twice daily to continue -Strict input output, daily weights -Daily BMP -Likely will require increased dose of Lasix on discharge to 40 mg daily   Type 2 diabetes mellitus treated without insulin (HCC) - HgbA1c pending - SSI- S - Hold Metformin   Atrial fibrillation (HCC) Rate controlled and on chronic anticoagulation with warfarin.  INR 2.7, therapeutic -Resume warfarin per pharmacy -Daily INR -Resume metoprolol, Cardizem   Essential hypertension Stable. -Resume Cardizem, metoprolol,  -Hold olmesartan for contrast exposure -IV Lasix -Resume home K-Dur supplementation 34mq daily  Weakness/falls -PT/OT evaluation   DVT prophylaxis: Warfarin Code Status: Full Family Communication: Husband at bedside Disposition Plan: Continue diuresis Status is: Observation The patient will require care spanning > 2 midnights and should be moved to inpatient because: Ongoing need for IV diuresis.   Consultants:  None  Procedures:  None  Antimicrobials:  Anti-infectives (From admission, onward)    Start     Dose/Rate Route Frequency Ordered Stop   04/01/22 1500  doxycycline (VIBRAMYCIN) 100 mg in sodium chloride 0.9 % 250 mL IVPB  Status:  Discontinued        100 mg 125 mL/hr over 120 Minutes Intravenous Every 12 hours 03/31/22 1749 04/01/22 1011   04/01/22 1400  cefTRIAXone (ROCEPHIN) 2 g in sodium chloride 0.9 % 100 mL IVPB  Status:  Discontinued        2 g 200 mL/hr over 30 Minutes Intravenous Every 24 hours 03/31/22 1749 04/01/22 1011   03/31/22 1500  azithromycin (ZITHROMAX) tablet 500 mg        500  mg Oral  Once 03/31/22 1453 03/31/22 1510   03/31/22 1300  cefTRIAXone (ROCEPHIN) 1 g in sodium chloride 0.9 % 100 mL IVPB        1 g 200 mL/hr over 30 Minutes Intravenous  Once 03/31/22 1246 03/31/22 1503       Subjective: Patient seen and evaluated today with no new acute complaints or concerns. No acute  concerns or events noted overnight.  Continues to have shortness of breath and lower extremity edema.  Denies any chest pain.  Objective: Vitals:   04/01/22 0058 04/01/22 0506 04/01/22 0844 04/01/22 0942  BP: (!) 106/56 122/69  126/73  Pulse: 73 97  77  Resp: 16 16    Temp: 98.1 F (36.7 C) 98 F (36.7 C)    TempSrc: Oral Oral    SpO2: 98% 98% 94%   Weight:  75.5 kg    Height:        Intake/Output Summary (Last 24 hours) at 04/01/2022 1012 Last data filed at 04/01/2022 0900 Gross per 24 hour  Intake 120 ml  Output 1100 ml  Net -980 ml   Filed Weights   03/31/22 1027 04/01/22 0506  Weight: 81.6 kg 75.5 kg    Examination:  General exam: Appears calm and comfortable  Respiratory system: Clear to auscultation. Respiratory effort normal.  1-2 L nasal cannula oxygen Cardiovascular system: S1 & S2 heard, RRR.  Gastrointestinal system: Abdomen is soft Central nervous system: Alert and awake Extremities: 2+ pitting edema bilaterally. Skin: No significant lesions noted, scab to posterior scalp noted from prior fall.  Staple removed. Psychiatry: Flat affect.    Data Reviewed: I have personally reviewed following labs and imaging studies  CBC: Recent Labs  Lab 03/31/22 1040 04/01/22 0417  WBC 6.5 6.1  HGB 12.2 11.8*  HCT 41.2 38.5  MCV 94.7 93.7  PLT 173 173   Basic Metabolic Panel: Recent Labs  Lab 03/31/22 1040 04/01/22 0417  NA 138 142  K 4.0 3.5  CL 104 105  CO2 27 27  GLUCOSE 117* 89  BUN 12 14  CREATININE 1.00 0.99  CALCIUM 9.1 8.6*  MG 1.9  --    GFR: Estimated Creatinine Clearance: 45.8 mL/min (by C-G formula based on SCr of 0.99 mg/dL). Liver Function Tests: Recent Labs  Lab 03/31/22 1040  AST 24  ALT 20  ALKPHOS 68  BILITOT 0.8  PROT 7.6  ALBUMIN 4.1   Recent Labs  Lab 03/31/22 1040  LIPASE 52*   No results for input(s): "AMMONIA" in the last 168 hours. Coagulation Profile: Recent Labs  Lab 03/31/22 1040 04/01/22 0417  INR 2.7*  2.5*   Cardiac Enzymes: No results for input(s): "CKTOTAL", "CKMB", "CKMBINDEX", "TROPONINI" in the last 168 hours. BNP (last 3 results) No results for input(s): "PROBNP" in the last 8760 hours. HbA1C: No results for input(s): "HGBA1C" in the last 72 hours. CBG: Recent Labs  Lab 03/31/22 2122 04/01/22 0717  GLUCAP 176* 119*   Lipid Profile: No results for input(s): "CHOL", "HDL", "LDLCALC", "TRIG", "CHOLHDL", "LDLDIRECT" in the last 72 hours. Thyroid Function Tests: No results for input(s): "TSH", "T4TOTAL", "FREET4", "T3FREE", "THYROIDAB" in the last 72 hours. Anemia Panel: No results for input(s): "VITAMINB12", "FOLATE", "FERRITIN", "TIBC", "IRON", "RETICCTPCT" in the last 72 hours. Sepsis Labs: Recent Labs  Lab 04/01/22 0417  PROCALCITON <0.10    Recent Results (from the past 240 hour(s))  SARS Coronavirus 2 by RT PCR (hospital order, performed in [facility] hospital lab) *cepheid single result  test* Anterior Nasal Swab     Status: None   Collection Time: 03/31/22 12:47 PM   Specimen: Anterior Nasal Swab  Result Value Ref Range Status   SARS Coronavirus 2 by RT PCR NEGATIVE NEGATIVE Final    Comment: (NOTE) SARS-CoV-2 target nucleic acids are NOT DETECTED.  The SARS-CoV-2 RNA is generally detectable in upper and lower respiratory specimens during the acute phase of infection. The lowest concentration of SARS-CoV-2 viral copies this assay can detect is 250 copies / mL. A negative result does not preclude SARS-CoV-2 infection and should not be used as the sole basis for treatment or other patient management decisions.  A negative result may occur with improper specimen collection / handling, submission of specimen other than nasopharyngeal swab, presence of viral mutation(s) within the areas targeted by this assay, and inadequate number of viral copies (<250 copies / mL). A negative result must be combined with clinical observations, patient history, and  epidemiological information.  Fact Sheet for Patients:   https://www.patel.info/  Fact Sheet for Healthcare Providers: https://hall.com/  This test is not yet approved or  cleared by the [place] FDA and has been authorized for detection and/or diagnosis of SARS-CoV-2 by FDA under an Emergency Use Authorization (EUA).  This EUA will remain in effect (meaning this test can be used) for the duration of the COVID-19 declaration under Section 564(b)(1) of the Act, 21 U.S.C. section 360bbb-3(b)(1), unless the authorization is terminated or revoked sooner.  Performed at [facility], [address] [place], [place] [address]          Radiology Studies: CT CHEST W CONTRAST  Result Date: 03/31/2022 CLINICAL DATA:  Right lung opacity and right pleural effusion. EXAM: CT CHEST WITH CONTRAST TECHNIQUE: Multidetector CT imaging of the chest was performed during intravenous contrast administration. RADIATION DOSE REDUCTION: This exam was performed according to the departmental dose-optimization program which includes automated exposure control, adjustment of the mA and/or kV according to patient size and/or use of iterative reconstruction technique. CONTRAST:  18m OMNIPAQUE IOHEXOL 300 MG/ML  SOLN COMPARISON:  06/01/2021 FINDINGS: Cardiovascular: No acute findings. Previous mitral and aortic valve replacements. Stable cardiomegaly. Mediastinum/Nodes: No masses or pathologically enlarged lymph nodes identified. Lungs/Pleura: New small right pleural effusion is seen. Tiny amount of left pleural fluid is also new. Calcified left-sided pleural plaque again noted. No evidence of pulmonary mass or consolidation. Several calcified granulomas are noted in the left upper and lower lobes. No evidence of central endobronchial obstruction. Upper Abdomen:  Unremarkable. Musculoskeletal:  No suspicious bone lesions. IMPRESSION: New small right pleural effusion and tiny  left pleural effusion. No evidence of pulmonary mass or consolidation. Stable calcified left pleural plaque, and calcified granulomas in left lung. Electronically Signed   By: JMarlaine HindM.D.   On: 03/31/2022 18:40   DG Chest Portable 1 View  Result Date: 03/31/2022 CLINICAL DATA:  Shortness of breath for 2 days EXAM: PORTABLE CHEST 1 VIEW COMPARISON:  Chest radiograph 02/25/2019 FINDINGS: Median sternotomy wires and cardiac valve prostheses are stable. The heart is enlarged, not significantly changed. The upper mediastinal contours are stable. There is vascular prominence with no definite overt edema. Opacity projecting over the right lateral lung may again reflect loculated pleural fluid, increased since the prior study. Patchy opacities in the right base may reflect atelectasis or developing infection. There is no significant pleural effusion on the left. There is no pneumothorax. Left apical scarring is unchanged. There is no acute osseous abnormality. IMPRESSION: 1. Opacity  over the right lateral lung is slightly increased from prior, and may again reflect loculated pleural fluid. 2. Patchy opacities in the right base may reflect atelectasis or developing infection. 3. Unchanged cardiomegaly. Electronically Signed   By: Valetta Mole M.D.   On: 03/31/2022 11:04        Scheduled Meds:  atorvastatin  40 mg Oral Daily   budesonide  0.5 mg Nebulization BID   diltiazem  180 mg Oral Daily   escitalopram  10 mg Oral Daily   furosemide  40 mg Intravenous Q12H   gabapentin  100 mg Oral QID   insulin aspart  0-5 Units Subcutaneous QHS   insulin aspart  0-9 Units Subcutaneous TID WC   levothyroxine  100 mcg Oral QAC breakfast   loratadine  10 mg Oral Daily   metoprolol tartrate  37.5 mg Oral BID   pantoprazole  40 mg Oral QAC breakfast   potassium chloride SA  20 mEq Oral Daily   warfarin  3 mg Oral ONCE-1600   Warfarin - Pharmacist Dosing Inpatient   Does not apply q1600     LOS: 0 days     Time spent: 35 minutes    [place] Darleen Crocker, DO [facility]  If 7PM-7AM, please contact night-coverage [email or web address] 04/01/2022, 10:12 AM

## 2022-04-01 NOTE — Progress Notes (Signed)
Went into patients room noted patient was not wearing oxygen, oxygen saturation was 86% on Room air. Placed patient on 1 liter oxygen saturation increased to 98% on 1 liter.

## 2022-04-01 NOTE — Progress Notes (Signed)
[place] for  Warfarin Indication: atrial fibrillation / MVR  Allergies  Allergen Reactions   Amiodarone Rash    Patient Measurements: Height: 5' 2.5" (158.8 cm) Weight: 75.5 kg (166 lb 7.2 oz) IBW/kg (Calculated) : 51.25   Vital Signs: Temp: 98 F (36.7 C) (06/30 0506) Temp Source: Oral (06/30 0506) BP: 122/69 (06/30 0506) Pulse Rate: 97 (06/30 0506)  Labs: Recent Labs    03/31/22 1040 03/31/22 1309 04/01/22 0417  HGB 12.2  --  11.8*  HCT 41.2  --  38.5  PLT 173  --  168  LABPROT 28.4*  --  26.8*  INR 2.7*  --  2.5*  CREATININE 1.00  --  0.99  TROPONINIHS 11 10  --      Estimated Creatinine Clearance: 45.8 mL/min (by C-G formula based on SCr of 0.99 mg/dL).   Assessment: 76 year old female on warfarin PTA for mitral valve and a-fib  Dose PTA 3.75 mg every Thursday, Sunday, 2.5 mg other days  INR therapeutic at 2.5  Goal of Therapy:  INR 2.5 to 3.5 Monitor platelets by anticoagulation protocol: Yes   Plan:  Warfarin 3 mg x  1 dose Daily INR for now  [name], PharmD Clinical Pharmacist 04/01/2022 7:52 AM

## 2022-04-01 NOTE — Progress Notes (Signed)
*  PRELIMINARY RESULTS* Echocardiogram 2D Echocardiogram has been performed.  [name] 04/01/2022, 10:49 AM

## 2022-04-01 NOTE — Progress Notes (Signed)
Patient has abrasion to back of left side of head, noted minimal bleeding to pillow case. Patient reported she fell a few weeks and then went to ED. MD Manuella Ghazi made aware.

## 2022-04-01 NOTE — Progress Notes (Signed)
OT Cancellation Note  Patient Details Name: [name] MRN: [id number] DOB: 12/30/1945   Cancelled Treatment:    Reason Eval/Treat Not Completed: Patient at procedure or test/ unavailable. Pt having procedure done in room. Not available at this time. Will attempt to see pt later as time permits.   Wellington Winegarden OT, MOT   Larey Seat 04/01/2022, 9:52 AM

## 2022-04-01 NOTE — Plan of Care (Signed)
  Problem: Acute Rehab PT Goals(only PT should resolve) Goal: Pt Will Go Supine/Side To Sit Outcome: Progressing Flowsheets (Taken 04/01/2022 1230) Pt will go Supine/Side to Sit: with modified independence Goal: Patient Will Transfer Sit To/From Stand Outcome: Progressing Flowsheets (Taken 04/01/2022 1230) Patient will transfer sit to/from stand:  with modified independence  with supervision Goal: Pt Will Transfer Bed To Chair/Chair To Bed Outcome: Progressing Flowsheets (Taken 04/01/2022 1230) Pt will Transfer Bed to Chair/Chair to Bed:  with modified independence  with supervision Goal: Pt Will Perform Standing Balance Or Pre-Gait Outcome: Progressing Flowsheets (Taken 04/01/2022 1230) Pt will perform standing balance or pre-gait:  3- 5 min  with unilateral UE support  with Supervision  with Modified Independent Goal: Pt Will Ambulate Outcome: Progressing Flowsheets (Taken 04/01/2022 1230) Pt will Ambulate:  75 feet  with supervision  with modified independence  with cane   12:30 PM, 04/01/22 Lestine Box, S/PT

## 2022-04-01 NOTE — TOC Initial Note (Signed)
Transition of Care [facility]) - Initial/Assessment Note    Patient Details  Name: [name] MRN: [id number] Date of Birth: October 26, 1945  Transition of Care [facility]) CM/SW Contact:    Boneta Lucks, RN Phone Number: 04/01/2022, 12:36 PM  Clinical Narrative:  Patient admitted with avute repiratory failure with hypoxia and fluid overload. TOC consulted for CHF. Living better book ordered and patient and her spouse is agreeable to [facility] for further education. PT eval is pending. Georgina Snell with [facility] accepted the referral . TOC to follow for PT recommendation.               Expected Discharge Plan: [place] Barriers to Discharge: Continued Medical Work up   Patient Goals and CMS Choice Patient states their goals for this hospitalization and ongoing recovery are:: to go home. CMS Medicare.gov Compare Post Acute Care list provided to:: Patient Choice offered to / list presented to : Patient  Expected Discharge Plan and Services Expected Discharge Plan: [place]      Living arrangements for the past 2 months: [place] Agency: [place] Date [facility] Agency Contacted: 04/01/22 Time HH Agency Contacted: [id number] Representative spoke with at [place]: Georgina Snell  Prior Living Arrangements/Services Living arrangements for the past 2 months: [place] Lives with:: Spouse Patient language and need for interpreter reviewed:: Yes Do you feel safe going back to the place where you live?: Yes      Need for Family Participation in Patient Care: Yes (Comment) Care giver support system in place?: Yes (comment)   Criminal Activity/Legal Involvement Pertinent to Current Situation/Hospitalization: No - Comment as needed  Permission Sought/Granted      Share Information with NAME: Herbie [place]     Permission granted to share info w Relationship: Husband     Emotional Assessment     Affect (typically observed):  Accepting Orientation: : Oriented to Self, Oriented to Place, Oriented to  Time, Oriented to Situation Alcohol / Substance Use: Not Applicable Psych Involvement: No (comment)  Admission diagnosis:  Acute respiratory failure with hypoxia ([place]) [J96.01] Community acquired pneumonia of right lower lobe of lung [J18.9] Acute on chronic congestive heart failure, unspecified heart failure type ([place]) [I50.9] Patient Active Problem List   Diagnosis Date Noted   Acute respiratory failure with hypoxia ([place]) 03/31/2022   Acute congestive heart failure ([place]) 03/31/2022   Pneumonia 03/31/2022   RSV infection 06/03/2021   COPD with acute exacerbation ([place]) 06/01/2021   Type 2 diabetes mellitus treated without insulin ([place]) 06/01/2021   COPD exacerbation ([place]) 06/01/2021   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   Asthma exacerbation 11/12/2016   Synovial cyst of lumbar spine 05/13/2014   Encounter for therapeutic drug monitoring 11/06/2013   Hyponatremia    Atrial fibrillation (HCC)    Moderate aortic stenosis 07/12/2013   Moderate aortic insufficiency 07/12/2013   Status post aortic valve and mitral valve replacement 07/12/2013   Severe mitral valve stenosis 07/09/2013   Mitral stenosis 07/08/2013   Chest pain 07/07/2013   Asthma, cough variant 03/27/2013   Upper airway cough syndrome in pt with mild cough variant asthma  09/17/2012   Dyspnea on exertion 06/13/2012   Edema 06/13/2012   Rheumatic heart disease 06/13/2012   Fasting hyperglycemia    Chronic anticoagulation 01/26/2012   Hypothyroidism 23/30/0762   Cardioembolic stroke ([place]) 26/33/3545   Hyperlipidemia    Essential hypertension    Breast  carcinoma ([place])    Gastroesophageal reflux disease    PCP:  [name], MD Pharmacy:   [facility] [place] [address] [place] [address] Phone: [phone] Fax: [phone]   Readmission Risk Interventions    06/04/2021   11:00  AM 06/04/2021   10:03 AM  Readmission Risk Prevention Plan  Transportation Screening Complete   Medication Review ([place])  Complete  PCP or Specialist appointment within 3-5 days of discharge Complete   HRI or [place]  Complete  SW Recovery Care/Counseling Consult  Complete  [place]  Not Applicable

## 2022-04-01 NOTE — Progress Notes (Signed)
OT Cancellation Note  Patient Details Name: [name] MRN: [id number] DOB: December 16, 1945   Cancelled Treatment:    Reason Eval/Treat Not Completed: OT screened, no needs identified, will sign off. Pt able to stand and ambulate in room without AD and no assist. Pt able to reach B LE without difficulty and demonstrated good B UE strength. Pt screened and will be removed from OT list.   Larey Seat OT, MOT   Larey Seat 04/01/2022, 2:00 PM

## 2022-04-02 DIAGNOSIS — J9601 Acute respiratory failure with hypoxia: Secondary | ICD-10-CM | POA: Diagnosis not present

## 2022-04-02 LAB — CBC
HCT: 39.4 % (ref 36.0–46.0)
Hemoglobin: 12.6 g/dL (ref 12.0–15.0)
MCH: 29.6 pg (ref 26.0–34.0)
MCHC: 32 g/dL (ref 30.0–36.0)
MCV: 92.7 fL (ref 80.0–100.0)
Platelets: 164 10*3/uL (ref 150–400)
RBC: 4.25 MIL/uL (ref 3.87–5.11)
RDW: 14.9 % (ref 11.5–15.5)
WBC: 5.9 10*3/uL (ref 4.0–10.5)
nRBC: 0 % (ref 0.0–0.2)

## 2022-04-02 LAB — HEMOGLOBIN A1C
Hgb A1c MFr Bld: 7.2 % — ABNORMAL HIGH (ref 4.8–5.6)
Mean Plasma Glucose: 160 mg/dL

## 2022-04-02 LAB — PROTIME-INR
INR: 2.7 — ABNORMAL HIGH (ref 0.8–1.2)
Prothrombin Time: 28.5 seconds — ABNORMAL HIGH (ref 11.4–15.2)

## 2022-04-02 LAB — BASIC METABOLIC PANEL
Anion gap: 8 (ref 5–15)
BUN: 16 mg/dL (ref 8–23)
CO2: 27 mmol/L (ref 22–32)
Calcium: 8.6 mg/dL — ABNORMAL LOW (ref 8.9–10.3)
Chloride: 102 mmol/L (ref 98–111)
Creatinine, Ser: 0.88 mg/dL (ref 0.44–1.00)
GFR, Estimated: >60 mL/min (ref >60)
Glucose, Bld: 112 mg/dL — ABNORMAL HIGH (ref 70–99)
Potassium: 3.1 mmol/L — ABNORMAL LOW (ref 3.5–5.1)
Sodium: 137 mmol/L (ref 135–145)

## 2022-04-02 LAB — GLUCOSE, CAPILLARY
Glucose-Capillary: 141 mg/dL — ABNORMAL HIGH (ref 70–99)
Glucose-Capillary: 157 mg/dL — ABNORMAL HIGH (ref 70–99)
Glucose-Capillary: 135 mg/dL — ABNORMAL HIGH (ref 70–99)
Glucose-Capillary: 138 mg/dL — ABNORMAL HIGH (ref 70–99)

## 2022-04-02 LAB — MAGNESIUM: Magnesium: 2 mg/dL (ref 1.7–2.4)

## 2022-04-02 MED ORDER — WARFARIN SODIUM 2.5 MG PO TABS
2.5000 mg | ORAL_TABLET | Freq: Once | ORAL | Status: AC
  Administered 2022-04-02: 2.5 mg via ORAL
  Filled 2022-04-02: qty 1

## 2022-04-02 MED ORDER — POTASSIUM CHLORIDE CRYS ER 20 MEQ PO TBCR
40.0000 meq | EXTENDED_RELEASE_TABLET | Freq: Two times a day (BID) | ORAL | Status: AC
  Administered 2022-04-02 (×2): 40 meq via ORAL
  Filled 2022-04-02: qty 2

## 2022-04-02 MED ORDER — FUROSEMIDE 10 MG/ML IJ SOLN
80.0000 mg | Freq: Two times a day (BID) | INTRAMUSCULAR | Status: DC
  Administered 2022-04-02 – 2022-04-03 (×3): 80 mg via INTRAVENOUS
  Filled 2022-04-02 (×2): qty 8

## 2022-04-02 NOTE — Progress Notes (Signed)
[place] for  Warfarin Indication: atrial fibrillation / MVR  Allergies  Allergen Reactions   Amiodarone Rash    Patient Measurements: Height: 5' 2.5" (158.8 cm) Weight: 75.2 kg (165 lb 12.6 oz) IBW/kg (Calculated) : 51.25   Vital Signs: Temp: 97.4 F (36.3 C) (07/01 0533) Temp Source: Oral (07/01 0533) BP: 122/73 (07/01 0533) Pulse Rate: 77 (07/01 0533)  Labs: Recent Labs    03/31/22 1040 03/31/22 1309 04/01/22 0417 04/02/22 0515  HGB 12.2  --  11.8* 12.6  HCT 41.2  --  38.5 39.4  PLT 173  --  168 164  LABPROT 28.4*  --  26.8* 28.5*  INR 2.7*  --  2.5* 2.7*  CREATININE 1.00  --  0.99 0.88  TROPONINIHS 11 10  --   --      Estimated Creatinine Clearance: 51.5 mL/min (by C-G formula based on SCr of 0.88 mg/dL).   Assessment: 76 year old female on warfarin PTA for mitral valve and a-fib  Dose PTA 3.75 mg every Thursday, Sunday, 2.5 mg other days  INR therapeutic at 2.7  Goal of Therapy:  INR 2.5 to 3.5 Monitor platelets by anticoagulation protocol: Yes   Plan:  Warfarin 2.5 mg x  1 dose Daily INR for now  [name], PharmD Clinical Pharmacist 04/02/2022 8:46 AM

## 2022-04-02 NOTE — Progress Notes (Signed)
Patient ambulated to the bathroom once during the night, she slept the rest of the shift.

## 2022-04-02 NOTE — Progress Notes (Signed)
PROGRESS NOTE    [name]  ERD:[id number] DOB: [birth year] DOA: 03/31/2022 PCP: [name], MD   Brief Narrative:    [name] is a 76 y.o. female with medical history significant for COPD and asthma, atrial fibrillation, stroke, aortic and mitral valve replacement, on chronic anticoagulation, diabetes mellitus. Patient presented to the ED with reports of difficulty breathing of 2 days duration.  She was admitted with acute hypoxemic respiratory failure in the setting of volume overload.  Pneumonia was initially suspected, but does not appear to be present.  Assessment & Plan:   Principal Problem:   Acute respiratory failure with hypoxia (HCC) Active Problems:   Acute congestive heart failure (HCC)   Pneumonia   Essential hypertension   Chronic anticoagulation   Cardioembolic stroke (HCC)   Atrial fibrillation (HCC)   Status post aortic valve and mitral valve replacement   Type 2 diabetes mellitus treated without insulin (HCC)  Assessment and Plan:   Acute respiratory failure with hypoxia (HCC) O2 sats down to 89% on room air.  She reports dyspnea and cough.  Portable chest x-ray suggesting right base atelectasis or developing infection, also loculated pleural fluid in right lateral lung slightly increased from prior.  Also possible component of congestive heart failure. -Supplemental O2 -IV antibiotics discontinued 6/30 as there is no sign of pneumonia and procalcitonin is low -Follow CBC -Wean oxygen as tolerated   Acute on chronic diastolic congestive heart failure (HCC) -2D echocardiogram with LVEF 60% and indeterminant diastolic dysfunction -IV Lasix now to 80 mg twice daily to increase fluid output -Strict input output, daily weights -Daily BMP -Likely will require increased dose of Lasix on discharge to 40 mg daily   Type 2 diabetes mellitus treated without insulin (HCC) - HgbA1c pending - SSI- S - Hold Metformin   Atrial fibrillation (HCC) Rate  controlled and on chronic anticoagulation with warfarin.  INR 2.7, therapeutic -Resume warfarin per pharmacy -Daily INR -Resume metoprolol, Cardizem   Essential hypertension Stable. -Resume Cardizem, metoprolol,  -Hold olmesartan for contrast exposure -IV Lasix -Resume home K-Dur supplementation 65mq daily   Weakness/falls -PT/OT evaluation recommending home health PT  Hypokalemia Replete and reevaluate in a.m.   DVT prophylaxis: Warfarin Code Status: Full Family Communication: Husband at bedside Disposition Plan: Continue diuresis Status is: Inpatient Remains inpatient appropriate because: Need for IV medications.    Consultants:  None   Procedures:  None   Antimicrobials:  Anti-infectives (From admission, onward)    Start     Dose/Rate Route Frequency Ordered Stop   04/01/22 1500  doxycycline (VIBRAMYCIN) 100 mg in sodium chloride 0.9 % 250 mL IVPB  Status:  Discontinued        100 mg 125 mL/hr over 120 Minutes Intravenous Every 12 hours 03/31/22 1749 04/01/22 1011   04/01/22 1400  cefTRIAXone (ROCEPHIN) 2 g in sodium chloride 0.9 % 100 mL IVPB  Status:  Discontinued        2 g 200 mL/hr over 30 Minutes Intravenous Every 24 hours 03/31/22 1749 04/01/22 1011   03/31/22 1500  azithromycin (ZITHROMAX) tablet 500 mg        500 mg Oral  Once 03/31/22 1453 03/31/22 1510   03/31/22 1300  cefTRIAXone (ROCEPHIN) 1 g in sodium chloride 0.9 % 100 mL IVPB        1 g 200 mL/hr over 30 Minutes Intravenous  Once 03/31/22 1246 03/31/22 1503       Subjective: Patient seen and evaluated today with no  new acute complaints or concerns. No acute concerns or events noted overnight.  Objective: Vitals:   04/02/22 0533 04/02/22 0704 04/02/22 0808 04/02/22 0912  BP: 122/73   121/71  Pulse: 77   75  Resp: 18     Temp: (!) 97.4 F (36.3 C)     TempSrc: Oral     SpO2: 99%  99%   Weight:  75.2 kg    Height:        Intake/Output Summary (Last 24 hours) at 04/02/2022 0925 Last  data filed at 04/02/2022 0704 Gross per 24 hour  Intake 360 ml  Output 1250 ml  Net -890 ml   Filed Weights   03/31/22 1027 04/01/22 0506 04/02/22 0704  Weight: 81.6 kg 75.5 kg 75.2 kg    Examination:  General exam: Appears calm and comfortable  Respiratory system: Clear to auscultation. Respiratory effort normal.  Currently on nasal cannula Cardiovascular system: S1 & S2 heard, RRR.  Gastrointestinal system: Abdomen is soft Central nervous system: Alert and awake Extremities: Persistent 1-2+ pitting edema bilaterally to mid shin Skin: No significant lesions noted Psychiatry: Flat affect.    Data Reviewed: I have personally reviewed following labs and imaging studies  CBC: Recent Labs  Lab 03/31/22 1040 04/01/22 0417 04/02/22 0515  WBC 6.5 6.1 5.9  HGB 12.2 11.8* 12.6  HCT 41.2 38.5 39.4  MCV 94.7 93.7 92.7  PLT 173 168 027   Basic Metabolic Panel: Recent Labs  Lab 03/31/22 1040 04/01/22 0417 04/02/22 0515  NA 138 142 137  K 4.0 3.5 3.1*  CL 104 105 102  CO2 '27 27 27  '$ GLUCOSE 117* 89 112*  BUN '12 14 16  '$ CREATININE 1.00 0.99 0.88  CALCIUM 9.1 8.6* 8.6*  MG 1.9  --  2.0   GFR: Estimated Creatinine Clearance: 51.5 mL/min (by C-G formula based on SCr of 0.88 mg/dL). Liver Function Tests: Recent Labs  Lab 03/31/22 1040  AST 24  ALT 20  ALKPHOS 68  BILITOT 0.8  PROT 7.6  ALBUMIN 4.1   Recent Labs  Lab 03/31/22 1040  LIPASE 52*   No results for input(s): "AMMONIA" in the last 168 hours. Coagulation Profile: Recent Labs  Lab 03/31/22 1040 04/01/22 0417 04/02/22 0515  INR 2.7* 2.5* 2.7*   Cardiac Enzymes: No results for input(s): "CKTOTAL", "CKMB", "CKMBINDEX", "TROPONINI" in the last 168 hours. BNP (last 3 results) No results for input(s): "PROBNP" in the last 8760 hours. HbA1C: Recent Labs    04/01/22 0417  HGBA1C 7.2*   CBG: Recent Labs  Lab 04/01/22 0717 04/01/22 1123 04/01/22 1625 04/01/22 2106 04/02/22 0725  GLUCAP 119*  167* 170* 144* 141*   Lipid Profile: No results for input(s): "CHOL", "HDL", "LDLCALC", "TRIG", "CHOLHDL", "LDLDIRECT" in the last 72 hours. Thyroid Function Tests: No results for input(s): "TSH", "T4TOTAL", "FREET4", "T3FREE", "THYROIDAB" in the last 72 hours. Anemia Panel: No results for input(s): "VITAMINB12", "FOLATE", "FERRITIN", "TIBC", "IRON", "RETICCTPCT" in the last 72 hours. Sepsis Labs: Recent Labs  Lab 04/01/22 0417  PROCALCITON <0.10    Recent Results (from the past 240 hour(s))  SARS Coronavirus 2 by RT PCR (hospital order, performed in [facility] hospital lab) *cepheid single result test* Anterior Nasal Swab     Status: None   Collection Time: 03/31/22 12:47 PM   Specimen: Anterior Nasal Swab  Result Value Ref Range Status   SARS Coronavirus 2 by RT PCR NEGATIVE NEGATIVE Final    Comment: (NOTE) SARS-CoV-2 target nucleic acids are NOT  DETECTED.  The SARS-CoV-2 RNA is generally detectable in upper and lower respiratory specimens during the acute phase of infection. The lowest concentration of SARS-CoV-2 viral copies this assay can detect is 250 copies / mL. A negative result does not preclude SARS-CoV-2 infection and should not be used as the sole basis for treatment or other patient management decisions.  A negative result may occur with improper specimen collection / handling, submission of specimen other than nasopharyngeal swab, presence of viral mutation(s) within the areas targeted by this assay, and inadequate number of viral copies (<250 copies / mL). A negative result must be combined with clinical observations, patient history, and epidemiological information.  Fact Sheet for Patients:   https://www.patel.info/  Fact Sheet for Healthcare Providers: https://hall.com/  This test is not yet approved or  cleared by the [place] FDA and has been authorized for detection and/or diagnosis of SARS-CoV-2 by FDA  under an Emergency Use Authorization (EUA).  This EUA will remain in effect (meaning this test can be used) for the duration of the COVID-19 declaration under Section 564(b)(1) of the Act, 21 U.S.C. section 360bbb-3(b)(1), unless the authorization is terminated or revoked sooner.  Performed at [facility], [address] [place], [place] [address]          Radiology Studies: ECHOCARDIOGRAM COMPLETE  Result Date: 04/01/2022    ECHOCARDIOGRAM REPORT   Patient Name:   [name] Date of Exam: 04/01/2022 Medical Rec #:  [id number]       Height:       62.5 in Accession #:    [id number]      Weight:       166.4 lb Date of Birth:  September 18, 1946       BSA:          1.779 m Patient Age:    71 years        BP:           122/69 mmHg Patient Gender: F               HR:           78 bpm. Exam Location:  [facility] Procedure: 2D Echo, Cardiac Doppler and Color Doppler Indications:    CHF  History:        Patient has prior history of Echocardiogram examinations, most                 recent 05/01/2020. CHF, Stroke and COPD, Arrythmias:Atrial                 Fibrillation, Signs/Symptoms:Dyspnea and Edema; Risk                 Factors:Hypertension and Dyslipidemia. Breast CA, There are 70m                 Magna Ease Bioprosthetic valves in the Aortic and Mitral valve                 positions.  Sonographer:    DWenda LowReferring Phys: [id number] 1. Left ventricular ejection fraction, by estimation, is 60%. The left ventricle has normal function. The left ventricle has no regional wall motion abnormalities. Left ventricular diastolic function could not be evaluated.  2. Caclified papillary muscles.  3. Right ventricular systolic function is normal. The right ventricular size is normal. There is normal pulmonary artery systolic pressure.  4. Left atrial size was moderately dilated.  5. The mitral valve  has been replaced with a 25 mm Magna Ease Bioprosthetic valve. No evidence of  mitral valve regurgitation. No evidence of mitral stenosis. Pressure half time 90 ms, TVI 1.2 cm2/m2, meag gradient 5 mm Hg.     Echo findings are consistent with normal structure and function of the mitral valve prosthesis.  6. The aortic valve has been replaced by a 21 mm Magna Ease Valve. Aortic valve regurgitation is not visualized. Aortic valve mean gradient measures 11.5 mmHg. Effective orifice area 1.34 cm2. DVI 0.47. Acceleration time 90 ms.     Echo findings are consistent with normal structure and function of the aortic valve prosthesis.  7. The inferior vena cava is normal in size with greater than 50% respiratory variability, suggesting right atrial pressure of 3 mmHg. Comparison(s): No significant change from prior study. FINDINGS  Left Ventricle: Left ventricular ejection fraction, by estimation, is 60%. The left ventricle has normal function. The left ventricle has no regional wall motion abnormalities. The left ventricular internal cavity size was small. There is no left ventricular hypertrophy. Left ventricular diastolic function could not be evaluated due to mitral valve replacement. Left ventricular diastolic function could not be evaluated. Right Ventricle: The right ventricular size is normal. No increase in right ventricular wall thickness. Right ventricular systolic function is normal. There is normal pulmonary artery systolic pressure. The tricuspid regurgitant velocity is 2.66 m/s, and  with an assumed right atrial pressure of 3 mmHg, the estimated right ventricular systolic pressure is 19.1 mmHg. Left Atrium: Left atrial size was moderately dilated. Right Atrium: Right atrial size was normal in size. Pericardium: Trivial pericardial effusion is present. Mitral Valve: The mitral valve has been repaired/replaced. No evidence of mitral valve regurgitation. There is a 25 mm Magna Ease present in the mitral position. Echo findings are consistent with normal structure and function of the mitral  valve prosthesis. No evidence of mitral valve stenosis. MV peak gradient, 13.0 mmHg. The mean mitral valve gradient is 5.0 mmHg. Tricuspid Valve: The tricuspid valve is normal in structure. Tricuspid valve regurgitation is trivial. No evidence of tricuspid stenosis. Aortic Valve: The aortic valve has been repaired/replaced. Aortic valve regurgitation is not visualized. Aortic valve mean gradient measures 11.5 mmHg. Aortic valve peak gradient measures 22.3 mmHg. Aortic valve area, by VTI measures 1.34 cm. There is a  21 mm Magna valve present in the aortic position. Echo findings are consistent with normal structure and function of the aortic valve prosthesis. Pulmonic Valve: The pulmonic valve was not well visualized. Pulmonic valve regurgitation is not visualized. No evidence of pulmonic stenosis. Aorta: The aortic root and ascending aorta are structurally normal, with no evidence of dilitation. Venous: The inferior vena cava is normal in size with greater than 50% respiratory variability, suggesting right atrial pressure of 3 mmHg. IAS/Shunts: No atrial level shunt detected by color flow Doppler.  LEFT VENTRICLE PLAX 2D LVIDd:         3.20 cm LVIDs:         2.50 cm LV PW:         1.30 cm LV IVS:        0.90 cm LVOT diam:     1.90 cm LV SV:         74 LV SV Index:   42 LVOT Area:     2.84 cm  LV Volumes (MOD) LV vol d, MOD A2C: 52.8 ml LV vol d, MOD A4C: 32.6 ml LV vol s, MOD A2C: 20.5 ml LV vol  s, MOD A4C: 16.0 ml LV SV MOD A2C:     32.3 ml LV SV MOD A4C:     32.6 ml LV SV MOD BP:      25.7 ml RIGHT VENTRICLE RV Basal diam:  3.15 cm RV Mid diam:    2.30 cm RV S prime:     7.94 cm/s TAPSE (M-mode): 2.3 cm LEFT ATRIUM             Index        RIGHT ATRIUM           Index LA diam:        4.60 cm 2.59 cm/m   RA Area:     16.40 cm LA Vol (A2C):   84.8 ml 47.67 ml/m  RA Volume:   42.20 ml  23.72 ml/m LA Vol (A4C):   57.7 ml 32.44 ml/m LA Biplane Vol: 72.3 ml 40.65 ml/m  AORTIC VALVE                     PULMONIC  VALVE AV Area (Vmax):    1.36 cm      PV Vmax:       0.48 m/s AV Area (Vmean):   1.39 cm      PV Peak grad:  0.9 mmHg AV Area (VTI):     1.34 cm AV Vmax:           236.33 cm/s AV Vmean:          154.667 cm/s AV VTI:            0.552 m AV Peak Grad:      22.3 mmHg AV Mean Grad:      11.5 mmHg LVOT Vmax:         113.00 cm/s LVOT Vmean:        75.650 cm/s LVOT VTI:          0.260 m LVOT/AV VTI ratio: 0.47  AORTA Ao Root diam: 1.90 cm Ao Asc diam:  2.70 cm MITRAL VALVE              TRICUSPID VALVE MV Area (PHT): 3.28 cm   TR Peak grad:   28.3 mmHg MV Area VTI:   2.14 cm   TR Vmax:        266.00 cm/s MV Peak grad:  13.0 mmHg MV Mean grad:  5.0 mmHg   SHUNTS MV Vmax:       1.81 m/s   Systemic VTI:  0.26 m MV Vmean:      97.3 cm/s  Systemic Diam: 1.90 cm Rudean Haskell MD Electronically signed by Rudean Haskell MD Signature Date/Time: 04/01/2022/3:20:08 PM    Final    CT CHEST W CONTRAST  Result Date: 03/31/2022 CLINICAL DATA:  Right lung opacity and right pleural effusion. EXAM: CT CHEST WITH CONTRAST TECHNIQUE: Multidetector CT imaging of the chest was performed during intravenous contrast administration. RADIATION DOSE REDUCTION: This exam was performed according to the departmental dose-optimization program which includes automated exposure control, adjustment of the mA and/or kV according to patient size and/or use of iterative reconstruction technique. CONTRAST:  40m OMNIPAQUE IOHEXOL 300 MG/ML  SOLN COMPARISON:  06/01/2021 FINDINGS: Cardiovascular: No acute findings. Previous mitral and aortic valve replacements. Stable cardiomegaly. Mediastinum/Nodes: No masses or pathologically enlarged lymph nodes identified. Lungs/Pleura: New small right pleural effusion is seen. Tiny amount of left pleural fluid is also new. Calcified left-sided pleural plaque again noted. No evidence of pulmonary mass or consolidation.  Several calcified granulomas are noted in the left upper and lower lobes. No evidence of  central endobronchial obstruction. Upper Abdomen:  Unremarkable. Musculoskeletal:  No suspicious bone lesions. IMPRESSION: New small right pleural effusion and tiny left pleural effusion. No evidence of pulmonary mass or consolidation. Stable calcified left pleural plaque, and calcified granulomas in left lung. Electronically Signed   By: Marlaine Hind M.D.   On: 03/31/2022 18:40   DG Chest Portable 1 View  Result Date: 03/31/2022 CLINICAL DATA:  Shortness of breath for 2 days EXAM: PORTABLE CHEST 1 VIEW COMPARISON:  Chest radiograph 02/25/2019 FINDINGS: Median sternotomy wires and cardiac valve prostheses are stable. The heart is enlarged, not significantly changed. The upper mediastinal contours are stable. There is vascular prominence with no definite overt edema. Opacity projecting over the right lateral lung may again reflect loculated pleural fluid, increased since the prior study. Patchy opacities in the right base may reflect atelectasis or developing infection. There is no significant pleural effusion on the left. There is no pneumothorax. Left apical scarring is unchanged. There is no acute osseous abnormality. IMPRESSION: 1. Opacity over the right lateral lung is slightly increased from prior, and may again reflect loculated pleural fluid. 2. Patchy opacities in the right base may reflect atelectasis or developing infection. 3. Unchanged cardiomegaly. Electronically Signed   By: Valetta Mole M.D.   On: 03/31/2022 11:04        Scheduled Meds:  atorvastatin  40 mg Oral Daily   budesonide  0.5 mg Nebulization BID   diltiazem  180 mg Oral Daily   escitalopram  10 mg Oral Daily   furosemide  80 mg Intravenous BID   gabapentin  100 mg Oral QID   insulin aspart  0-5 Units Subcutaneous QHS   insulin aspart  0-9 Units Subcutaneous TID WC   levothyroxine  100 mcg Oral QAC breakfast   loratadine  10 mg Oral Daily   metoprolol tartrate  37.5 mg Oral BID   pantoprazole  40 mg Oral QAC breakfast    potassium chloride  40 mEq Oral BID   warfarin  2.5 mg Oral ONCE-1600   Warfarin - Pharmacist Dosing Inpatient   Does not apply q1600     LOS: 1 day    Time spent: 35 minutes    Shanina Kepple Darleen Crocker, DO [facility]  If 7PM-7AM, please contact night-coverage [email or web address] 04/02/2022, 9:25 AM

## 2022-04-03 DIAGNOSIS — J9601 Acute respiratory failure with hypoxia: Secondary | ICD-10-CM | POA: Diagnosis not present

## 2022-04-03 LAB — PROTIME-INR
INR: 2.5 — ABNORMAL HIGH (ref 0.8–1.2)
Prothrombin Time: 27.1 seconds — ABNORMAL HIGH (ref 11.4–15.2)

## 2022-04-03 LAB — BASIC METABOLIC PANEL
Anion gap: 12 (ref 5–15)
BUN: 19 mg/dL (ref 8–23)
CO2: 24 mmol/L (ref 22–32)
Calcium: 8.7 mg/dL — ABNORMAL LOW (ref 8.9–10.3)
Chloride: 102 mmol/L (ref 98–111)
Creatinine, Ser: 0.8 mg/dL (ref 0.44–1.00)
GFR, Estimated: >60 mL/min (ref >60)
Glucose, Bld: 112 mg/dL — ABNORMAL HIGH (ref 70–99)
Potassium: 3.4 mmol/L — ABNORMAL LOW (ref 3.5–5.1)
Sodium: 138 mmol/L (ref 135–145)

## 2022-04-03 LAB — GLUCOSE, CAPILLARY: Glucose-Capillary: 130 mg/dL — ABNORMAL HIGH (ref 70–99)

## 2022-04-03 LAB — MAGNESIUM: Magnesium: 2.2 mg/dL (ref 1.7–2.4)

## 2022-04-03 MED ORDER — POTASSIUM CHLORIDE CRYS ER 20 MEQ PO TBCR: 20.0000 meq | EXTENDED_RELEASE_TABLET | Freq: Two times a day (BID) | ORAL | 0 refills | Status: DC

## 2022-04-03 MED ORDER — FUROSEMIDE 40 MG PO TABS: 40.0000 mg | ORAL_TABLET | Freq: Every day | ORAL | 11 refills | Status: DC

## 2022-04-03 MED ORDER — POTASSIUM CHLORIDE CRYS ER 20 MEQ PO TBCR
40.0000 meq | EXTENDED_RELEASE_TABLET | Freq: Two times a day (BID) | ORAL | Status: DC
  Administered 2022-04-03: 40 meq via ORAL
  Filled 2022-04-03: qty 2

## 2022-04-03 MED ORDER — WARFARIN SODIUM 2.5 MG PO TABS
3.7500 mg | ORAL_TABLET | Freq: Once | ORAL | Status: DC
Start: 2022-04-03 — End: 2022-04-03
  Filled 2022-04-03: qty 1.5

## 2022-04-03 NOTE — Discharge Summary (Signed)
Physician Discharge Summary  [name] UXN:[id number] DOB: 24-Oct-1945 DOA: 03/31/2022  PCP: [name], MD  Admit date: 03/31/2022  Discharge date: 04/03/2022  Admitted From:Home  Disposition:  Home  Recommendations for Outpatient Follow-up:  Follow up with PCP in 1-2 weeks Follow-up with cardiologist Dr. Harl Bowie as recommended in the next 2 weeks to reassess diuretic dose on account of acute diastolic CHF exacerbation Increased diuretic to Lasix 40 mg daily as opposed to as needed dosing previously with extra dose permissible if worsening edema or weight gain noted Continue on potassium 20 mEq twice daily Remain on other medications as prior  Home Health: None  Equipment/Devices: None  Discharge Condition:Stable  CODE STATUS: Full  Diet recommendation: Heart Healthy/carb modified  Brief/Interim Summary: [name] is a 76 y.o. female with medical history significant for COPD and asthma, atrial fibrillation, stroke, aortic and mitral valve replacement, on chronic anticoagulation, diabetes mellitus. Patient presented to the ED with reports of difficulty breathing of 2 days duration.  She was admitted with acute hypoxemic respiratory failure in the setting of volume overload.  Pneumonia was initially suspected, but was ruled out and antibiotics were discontinued.  2D echocardiogram with preserved LVEF and suspected diastolic dysfunction noted.  She has diuresed quite well and is under her baseline weight of 173 pounds to 165 pounds at this time.  She has diuresed nearly 2 L of fluid and has no further oxygen requirements or significant edema. REDS Clip reading 36%.  She is eager to go home and will have her Lasix dose increased to 40 mg daily as opposed to as needed dosing previously.  She continues to have mild hypokalemia for which her potassium supplementation has been increased as well.  No other acute events noted during the course of the stay.  Discharge Diagnoses:   Principal Problem:   Acute respiratory failure with hypoxia (HCC) Active Problems:   Acute congestive heart failure (HCC)   Pneumonia   Essential hypertension   Chronic anticoagulation   Cardioembolic stroke [facility])   Atrial fibrillation (HCC)   Status post aortic valve and mitral valve replacement   Type 2 diabetes mellitus treated without insulin ([facility])  Principal discharge diagnosis: Acute hypoxemic respiratory failure likely secondary to volume overload with acute on chronic diastolic CHF exacerbation.  Discharge Instructions  Discharge Instructions     Ambulatory referral to Cardiology   Complete by: As directed    Diet - low sodium heart healthy   Complete by: As directed    Increase activity slowly   Complete by: As directed       Allergies as of 04/03/2022       Reactions   Amiodarone Rash        Medication List     STOP taking these medications    predniSONE 10 MG tablet Commonly known as: DELTASONE       TAKE these medications    albuterol (2.5 MG/3ML) 0.083% nebulizer solution Commonly known as: PROVENTIL Take 3 mLs (2.5 mg total) by nebulization every 4 (four) hours as needed for wheezing or shortness of breath. What changed: when to take this   atorvastatin 40 MG tablet Commonly known as: LIPITOR TAKE ONE TABLET BY MOUTH ONCE DAILY.   budesonide 0.25 MG/2ML nebulizer solution Commonly known as: Pulmicort One 0.25 mg  twice daily  along with albuterol   budesonide 0.5 MG/2ML nebulizer solution Commonly known as: PULMICORT Take 0.5 mg by nebulization 2 (two) times daily.   cetirizine 10 MG  tablet Commonly known as: ZYRTEC Take 10 mg by mouth daily.   chlorhexidine 0.12 % solution Commonly known as: PERIDEX Use as directed 15 mLs in the mouth or throat 2 (two) times daily.   diclofenac Sodium 1 % Gel Commonly known as: Voltaren Apply 2 g topically 4 (four) times daily.   diltiazem 180 MG 24 hr capsule Commonly known as: CARDIZEM  CD Take 1 capsule (180 mg total) by mouth daily.   escitalopram 10 MG tablet Commonly known as: LEXAPRO Take 10 mg by mouth daily.   famotidine 20 MG tablet Commonly known as: PEPCID TAKE 1 TABLET AFTER SUPPER What changed: See the new instructions.   fluticasone 50 MCG/ACT nasal spray Commonly known as: FLONASE Place 1 spray into both nostrils daily as needed for allergies.   furosemide 40 MG tablet Commonly known as: Lasix Take 1 tablet (40 mg total) by mouth daily. Can take an extra '40mg'$  as needed for swelling, or 3lb weight gain in 24 hours. What changed:  medication strength See the new instructions.   gabapentin 100 MG capsule Commonly known as: NEURONTIN TAKE 1 CAPSULE BY MOUTH FOUR TIMES DAILY.   levothyroxine 100 MCG tablet Commonly known as: SYNTHROID Take 100 mcg by mouth daily before breakfast.   metFORMIN 500 MG tablet Commonly known as: GLUCOPHAGE Take 1 tablet (500 mg total) by mouth 2 (two) times daily with a meal.   metoprolol tartrate 25 MG tablet Commonly known as: LOPRESSOR Take 1.5 tablets (37.5 mg total) by mouth 2 (two) times daily.   multivitamin with minerals Tabs tablet Take 1 tablet by mouth daily.   olmesartan 5 MG tablet Commonly known as: BENICAR Take 5 mg by mouth daily.   ondansetron 4 MG tablet Commonly known as: Zofran Take 1 tablet (4 mg total) by mouth every 8 (eight) hours as needed for nausea or vomiting.   pantoprazole 40 MG tablet Commonly known as: PROTONIX TAKE 1 TABLET 30 TO 60 MINUTES BEFORE FIRST MEAL OF THE DAY. What changed: See the new instructions.   potassium chloride SA 20 MEQ tablet Commonly known as: KLOR-CON M Take 1 tablet (20 mEq total) by mouth 2 (two) times daily. What changed: when to take this   PreviDent 5000 Booster Plus 1.1 % Pste Generic drug: Sodium Fluoride Place 1 application  onto teeth daily.   warfarin 2.5 MG tablet Commonly known as: COUMADIN Take as directed. If you are unsure how  to take this medication, talk to your nurse or doctor. Original instructions: TAKE AS DIRECTED BY COUMADIN CLINIC. What changed: See the new instructions.        Follow-up Information     [facility] Follow up.   Specialty: Home Health Services Why: Home health will call to schedule your first visit. Contact information: [place] [address] [phone]         [name], MD. Schedule an appointment as soon as possible for a visit in 1 week(s).   Specialty: Internal Medicine Contact information: [address] [place] [address] [phone]         Arnoldo Lenis, MD. Go in 3 week(s).   Specialty: Cardiology Contact information: [place] [address] [phone]                Allergies  Allergen Reactions   Amiodarone Rash    Consultations: None   Procedures/Studies: ECHOCARDIOGRAM COMPLETE  Result Date: 04/01/2022    ECHOCARDIOGRAM  REPORT   Patient Name:   [name] Date of Exam: 04/01/2022 Medical Rec #:  [id number]       Height:       62.5 in Accession #:    [id number]      Weight:       166.4 lb Date of Birth:  August 23, 1946       BSA:          1.779 m Patient Age:    33 years        BP:           122/69 mmHg Patient Gender: F               HR:           78 bpm. Exam Location:  [facility] Procedure: 2D Echo, Cardiac Doppler and Color Doppler Indications:    CHF  History:        Patient has prior history of Echocardiogram examinations, most                 recent 05/01/2020. CHF, Stroke and COPD, Arrythmias:Atrial                 Fibrillation, Signs/Symptoms:Dyspnea and Edema; Risk                 Factors:Hypertension and Dyslipidemia. Breast CA, There are 40m                 Magna Ease Bioprosthetic valves in the Aortic and Mitral valve                 positions.  Sonographer:    DWenda LowReferring Phys: 6[place] 1. Left ventricular ejection  fraction, by estimation, is 60%. The left ventricle has normal function. The left ventricle has no regional wall motion abnormalities. Left ventricular diastolic function could not be evaluated.  2. Caclified papillary muscles.  3. Right ventricular systolic function is normal. The right ventricular size is normal. There is normal pulmonary artery systolic pressure.  4. Left atrial size was moderately dilated.  5. The mitral valve has been replaced with a 25 mm Magna Ease Bioprosthetic valve. No evidence of mitral valve regurgitation. No evidence of mitral stenosis. Pressure half time 90 ms, TVI 1.2 cm2/m2, meag gradient 5 mm Hg.     Echo findings are consistent with normal structure and function of the mitral valve prosthesis.  6. The aortic valve has been replaced by a 21 mm Magna Ease Valve. Aortic valve regurgitation is not visualized. Aortic valve mean gradient measures 11.5 mmHg. Effective orifice area 1.34 cm2. DVI 0.47. Acceleration time 90 ms.     Echo findings are consistent with normal structure and function of the aortic valve prosthesis.  7. The inferior vena cava is normal in size with greater than 50% respiratory variability, suggesting right atrial pressure of 3 mmHg. Comparison(s): No significant change from prior study. FINDINGS  Left Ventricle: Left ventricular ejection fraction, by estimation, is 60%. The left ventricle has normal function. The left ventricle has no regional wall motion abnormalities. The left ventricular internal cavity size was small. There is no left ventricular hypertrophy. Left ventricular diastolic function could not be evaluated due to mitral valve replacement. Left ventricular diastolic function could not be evaluated. Right Ventricle: The right ventricular size is normal. No increase in right ventricular wall thickness. Right ventricular systolic function is normal. There is normal pulmonary artery systolic pressure. The tricuspid regurgitant  velocity is 2.66 m/s, and   with an assumed right atrial pressure of 3 mmHg, the estimated right ventricular systolic pressure is 95.2 mmHg. Left Atrium: Left atrial size was moderately dilated. Right Atrium: Right atrial size was normal in size. Pericardium: Trivial pericardial effusion is present. Mitral Valve: The mitral valve has been repaired/replaced. No evidence of mitral valve regurgitation. There is a 25 mm Magna Ease present in the mitral position. Echo findings are consistent with normal structure and function of the mitral valve prosthesis. No evidence of mitral valve stenosis. MV peak gradient, 13.0 mmHg. The mean mitral valve gradient is 5.0 mmHg. Tricuspid Valve: The tricuspid valve is normal in structure. Tricuspid valve regurgitation is trivial. No evidence of tricuspid stenosis. Aortic Valve: The aortic valve has been repaired/replaced. Aortic valve regurgitation is not visualized. Aortic valve mean gradient measures 11.5 mmHg. Aortic valve peak gradient measures 22.3 mmHg. Aortic valve area, by VTI measures 1.34 cm. There is a  21 mm Magna valve present in the aortic position. Echo findings are consistent with normal structure and function of the aortic valve prosthesis. Pulmonic Valve: The pulmonic valve was not well visualized. Pulmonic valve regurgitation is not visualized. No evidence of pulmonic stenosis. Aorta: The aortic root and ascending aorta are structurally normal, with no evidence of dilitation. Venous: The inferior vena cava is normal in size with greater than 50% respiratory variability, suggesting right atrial pressure of 3 mmHg. IAS/Shunts: No atrial level shunt detected by color flow Doppler.  LEFT VENTRICLE PLAX 2D LVIDd:         3.20 cm LVIDs:         2.50 cm LV PW:         1.30 cm LV IVS:        0.90 cm LVOT diam:     1.90 cm LV SV:         74 LV SV Index:   42 LVOT Area:     2.84 cm  LV Volumes (MOD) LV vol d, MOD A2C: 52.8 ml LV vol d, MOD A4C: 32.6 ml LV vol s, MOD A2C: 20.5 ml LV vol s, MOD A4C:  16.0 ml LV SV MOD A2C:     32.3 ml LV SV MOD A4C:     32.6 ml LV SV MOD BP:      25.7 ml RIGHT VENTRICLE RV Basal diam:  3.15 cm RV Mid diam:    2.30 cm RV S prime:     7.94 cm/s TAPSE (M-mode): 2.3 cm LEFT ATRIUM             Index        RIGHT ATRIUM           Index LA diam:        4.60 cm 2.59 cm/m   RA Area:     16.40 cm LA Vol (A2C):   84.8 ml 47.67 ml/m  RA Volume:   42.20 ml  23.72 ml/m LA Vol (A4C):   57.7 ml 32.44 ml/m LA Biplane Vol: 72.3 ml 40.65 ml/m  AORTIC VALVE                     PULMONIC VALVE AV Area (Vmax):    1.36 cm      PV Vmax:       0.48 m/s AV Area (Vmean):   1.39 cm      PV Peak grad:  0.9 mmHg AV Area (VTI):     1.34 cm  AV Vmax:           236.33 cm/s AV Vmean:          154.667 cm/s AV VTI:            0.552 m AV Peak Grad:      22.3 mmHg AV Mean Grad:      11.5 mmHg LVOT Vmax:         113.00 cm/s LVOT Vmean:        75.650 cm/s LVOT VTI:          0.260 m LVOT/AV VTI ratio: 0.47  AORTA Ao Root diam: 1.90 cm Ao Asc diam:  2.70 cm MITRAL VALVE              TRICUSPID VALVE MV Area (PHT): 3.28 cm   TR Peak grad:   28.3 mmHg MV Area VTI:   2.14 cm   TR Vmax:        266.00 cm/s MV Peak grad:  13.0 mmHg MV Mean grad:  5.0 mmHg   SHUNTS MV Vmax:       1.81 m/s   Systemic VTI:  0.26 m MV Vmean:      97.3 cm/s  Systemic Diam: 1.90 cm Rudean Haskell MD Electronically signed by Rudean Haskell MD Signature Date/Time: 04/01/2022/3:20:08 PM    Final    CT CHEST W CONTRAST  Result Date: 03/31/2022 CLINICAL DATA:  Right lung opacity and right pleural effusion. EXAM: CT CHEST WITH CONTRAST TECHNIQUE: Multidetector CT imaging of the chest was performed during intravenous contrast administration. RADIATION DOSE REDUCTION: This exam was performed according to the departmental dose-optimization program which includes automated exposure control, adjustment of the mA and/or kV according to patient size and/or use of iterative reconstruction technique. CONTRAST:  65m OMNIPAQUE IOHEXOL 300  MG/ML  SOLN COMPARISON:  06/01/2021 FINDINGS: Cardiovascular: No acute findings. Previous mitral and aortic valve replacements. Stable cardiomegaly. Mediastinum/Nodes: No masses or pathologically enlarged lymph nodes identified. Lungs/Pleura: New small right pleural effusion is seen. Tiny amount of left pleural fluid is also new. Calcified left-sided pleural plaque again noted. No evidence of pulmonary mass or consolidation. Several calcified granulomas are noted in the left upper and lower lobes. No evidence of central endobronchial obstruction. Upper Abdomen:  Unremarkable. Musculoskeletal:  No suspicious bone lesions. IMPRESSION: New small right pleural effusion and tiny left pleural effusion. No evidence of pulmonary mass or consolidation. Stable calcified left pleural plaque, and calcified granulomas in left lung. Electronically Signed   By: JMarlaine HindM.D.   On: 03/31/2022 18:40   DG Chest Portable 1 View  Result Date: 03/31/2022 CLINICAL DATA:  Shortness of breath for 2 days EXAM: PORTABLE CHEST 1 VIEW COMPARISON:  Chest radiograph 02/25/2019 FINDINGS: Median sternotomy wires and cardiac valve prostheses are stable. The heart is enlarged, not significantly changed. The upper mediastinal contours are stable. There is vascular prominence with no definite overt edema. Opacity projecting over the right lateral lung may again reflect loculated pleural fluid, increased since the prior study. Patchy opacities in the right base may reflect atelectasis or developing infection. There is no significant pleural effusion on the left. There is no pneumothorax. Left apical scarring is unchanged. There is no acute osseous abnormality. IMPRESSION: 1. Opacity over the right lateral lung is slightly increased from prior, and may again reflect loculated pleural fluid. 2. Patchy opacities in the right base may reflect atelectasis or developing infection. 3. Unchanged cardiomegaly. Electronically Signed   By: PCourt JoyD.  On: 03/31/2022 11:04   CT HEAD WO CONTRAST (5MM)  Result Date: 03/16/2022 CLINICAL DATA:  Fall, hit back of head EXAM: CT HEAD WITHOUT CONTRAST TECHNIQUE: Contiguous axial images were obtained from the base of the skull through the vertex without intravenous contrast. RADIATION DOSE REDUCTION: This exam was performed according to the departmental dose-optimization program which includes automated exposure control, adjustment of the mA and/or kV according to patient size and/or use of iterative reconstruction technique. COMPARISON:  CT head 03/12/2022 FINDINGS: Brain: There is no acute intracranial hemorrhage, extra-axial fluid collection, or acute infarct. Is mild global parenchymal volume loss with prominence of the ventricular system and extra-axial CSF spaces. Encephalomalacia in the right superior frontal gyrus is unchanged. Slight ex vacuo dilatation of the adjacent right lateral ventricle is unchanged. Gray-white differentiation is otherwise preserved patchy hypodensity in the remainder of the subcortical and periventricular white matter likely reflects sequela of chronic white matter microangiopathy. There is no mass lesion.  There is no mass effect or midline shift. Vascular: No hyperdense vessel or unexpected calcification. Skull: Normal. Negative for fracture or focal lesion. Sinuses/Orbits: The paranasal sinuses are clear. The globes and orbits are unremarkable. Other: There is a moderate-sized right parietal scalp hematoma. IMPRESSION: 1. No acute intracranial pathology. 2. Moderate-sized right parietal scalp hematoma without underlying calvarial fracture. Electronically Signed   By: Valetta Mole M.D.   On: 03/16/2022 08:51   CT HEAD WO CONTRAST (5MM)  Result Date: 03/13/2022 CLINICAL DATA:  Head trauma, fall. EXAM: CT HEAD WITHOUT CONTRAST TECHNIQUE: Contiguous axial images were obtained from the base of the skull through the vertex without intravenous contrast. RADIATION DOSE REDUCTION:  This exam was performed according to the departmental dose-optimization program which includes automated exposure control, adjustment of the mA and/or kV according to patient size and/or use of iterative reconstruction technique. COMPARISON:  02/24/2022. FINDINGS: Brain: No acute intracranial hemorrhage, midline shift or mass effect. No extra-axial fluid collection. An old infarct is noted in the frontal lobe on the right. Diffuse atrophy is noted. Periventricular white matter hypodensities are present bilaterally. No hydrocephalus. Old infarcts are noted in the cerebellum on the left. Vascular: No hyperdense vessel or unexpected calcification. Skull: Normal. Negative for fracture or focal lesion. Sinuses/Orbits: No acute finding. Other: A large scalp hematoma is present over the parietal bone on the right. IMPRESSION: 1. Stable head CT with no acute intracranial process. 2. Large scalp hematoma over the parietal bone on the right. Electronically Signed   By: Brett Fairy M.D.   On: 03/13/2022 00:17     Discharge Exam: Vitals:   04/03/22 0437 04/03/22 0836  BP: 112/67   Pulse: 100   Resp: 16   Temp: 97.8 F (36.6 C)   SpO2: 95% 92%   Vitals:   04/02/22 2135 04/03/22 0437 04/03/22 0500 04/03/22 0836  BP: 122/70 112/67    Pulse: 95 100    Resp: 18 16    Temp: 97.9 F (36.6 C) 97.8 F (36.6 C)    TempSrc: Oral Oral    SpO2: 96% 95%  92%  Weight:   75.1 kg   Height:        General: Pt is alert, awake, not in acute distress Cardiovascular: RRR, S1/S2 +, no rubs, no gallops Respiratory: CTA bilaterally, no wheezing, no rhonchi Abdominal: Soft, NT, ND, bowel sounds + Extremities: no edema, no cyanosis    The results of significant diagnostics from this hospitalization (including imaging, microbiology, ancillary and laboratory) are listed below for  reference.     Microbiology: Recent Results (from the past 240 hour(s))  SARS Coronavirus 2 by RT PCR (hospital order, performed in [facility] hospital lab) *cepheid single result test* Anterior Nasal Swab     Status: None   Collection Time: 03/31/22 12:47 PM   Specimen: Anterior Nasal Swab  Result Value Ref Range Status   SARS Coronavirus 2 by RT PCR NEGATIVE NEGATIVE Final    Comment: (NOTE) SARS-CoV-2 target nucleic acids are NOT DETECTED.  The SARS-CoV-2 RNA is generally detectable in upper and lower respiratory specimens during the acute phase of infection. The lowest concentration of SARS-CoV-2 viral copies this assay can detect is 250 copies / mL. A negative result does not preclude SARS-CoV-2 infection and should not be used as the sole basis for treatment or other patient management decisions.  A negative result may occur with improper specimen collection / handling, submission of specimen other than nasopharyngeal swab, presence of viral mutation(s) within the areas targeted by this assay, and inadequate number of viral copies (<250 copies / mL). A negative result must be combined with clinical observations, patient history, and epidemiological information.  Fact Sheet for Patients:   https://www.patel.info/  Fact Sheet for Healthcare Providers: https://hall.com/  This test is not yet approved or  cleared by the [place] FDA and has been authorized for detection and/or diagnosis of SARS-CoV-2 by FDA under an Emergency Use Authorization (EUA).  This EUA will remain in effect (meaning this test can be used) for the duration of the COVID-19 declaration under Section 564(b)(1) of the Act, 21 U.S.C. section 360bbb-3(b)(1), unless the authorization is terminated or revoked sooner.  Performed at [facility], [address] [place], [place] [address]      Labs: BNP (last 3 results) Recent Labs    06/01/21 1554 03/31/22 1040  BNP 114.0* 962.2*   Basic Metabolic Panel: Recent Labs  Lab 03/31/22 1040 04/01/22 0417 04/02/22 0515 04/03/22 0537  NA 138  142 137 138  K 4.0 3.5 3.1* 3.4*  CL 104 105 102 102  CO2 '27 27 27 24  '$ GLUCOSE 117* 89 112* 112*  BUN '12 14 16 19  '$ CREATININE 1.00 0.99 0.88 0.80  CALCIUM 9.1 8.6* 8.6* 8.7*  MG 1.9  --  2.0 2.2   Liver Function Tests: Recent Labs  Lab 03/31/22 1040  AST 24  ALT 20  ALKPHOS 68  BILITOT 0.8  PROT 7.6  ALBUMIN 4.1   Recent Labs  Lab 03/31/22 1040  LIPASE 52*   No results for input(s): "AMMONIA" in the last 168 hours. CBC: Recent Labs  Lab 03/31/22 1040 04/01/22 0417 04/02/22 0515  WBC 6.5 6.1 5.9  HGB 12.2 11.8* 12.6  HCT 41.2 38.5 39.4  MCV 94.7 93.7 92.7  PLT 173 168 164   Cardiac Enzymes: No results for input(s): "CKTOTAL", "CKMB", "CKMBINDEX", "TROPONINI" in the last 168 hours. BNP: Invalid input(s): "POCBNP" CBG: Recent Labs  Lab 04/02/22 0725 04/02/22 1131 04/02/22 1544 04/02/22 2129 04/03/22 0718  GLUCAP 141* 157* 135* 138* 130*   D-Dimer No results for input(s): "DDIMER" in the last 72 hours. Hgb A1c Recent Labs    04/01/22 0417  HGBA1C 7.2*   Lipid Profile No results for input(s): "CHOL", "HDL", "LDLCALC", "TRIG", "CHOLHDL", "LDLDIRECT" in the last 72 hours. Thyroid function studies No results for input(s): "TSH", "T4TOTAL", "T3FREE", "THYROIDAB" in the last 72 hours.  Invalid input(s): "FREET3" Anemia work up No results for input(s): "VITAMINB12", "FOLATE", "FERRITIN", "TIBC", "IRON", "RETICCTPCT"  in the last 72 hours. Urinalysis    Component Value Date/Time   COLORURINE YELLOW 11/10/2021 0033   APPEARANCEUR HAZY (A) 11/10/2021 0033   LABSPEC 1.008 11/10/2021 0033   PHURINE 6.0 11/10/2021 0033   GLUCOSEU NEGATIVE 11/10/2021 0033   HGBUR SMALL (A) 11/10/2021 0033   BILIRUBINUR NEGATIVE 11/10/2021 0033   KETONESUR NEGATIVE 11/10/2021 0033   PROTEINUR NEGATIVE 11/10/2021 0033   UROBILINOGEN 0.2 07/07/2014 1935   NITRITE NEGATIVE 11/10/2021 0033   LEUKOCYTESUR LARGE (A) 11/10/2021 0033   Sepsis Labs Recent Labs  Lab  03/31/22 1040 04/01/22 0417 04/02/22 0515  WBC 6.5 6.1 5.9   Microbiology Recent Results (from the past 240 hour(s))  SARS Coronavirus 2 by RT PCR (hospital order, performed in [place] hospital lab) *cepheid single result test* Anterior Nasal Swab     Status: None   Collection Time: 03/31/22 12:47 PM   Specimen: Anterior Nasal Swab  Result Value Ref Range Status   SARS Coronavirus 2 by RT PCR NEGATIVE NEGATIVE Final    Comment: (NOTE) SARS-CoV-2 target nucleic acids are NOT DETECTED.  The SARS-CoV-2 RNA is generally detectable in upper and lower respiratory specimens during the acute phase of infection. The lowest concentration of SARS-CoV-2 viral copies this assay can detect is 250 copies / mL. A negative result does not preclude SARS-CoV-2 infection and should not be used as the sole basis for treatment or other patient management decisions.  A negative result may occur with improper specimen collection / handling, submission of specimen other than nasopharyngeal swab, presence of viral mutation(s) within the areas targeted by this assay, and inadequate number of viral copies (<250 copies / mL). A negative result must be combined with clinical observations, patient history, and epidemiological information.  Fact Sheet for Patients:   https://www.patel.info/  Fact Sheet for Healthcare Providers: https://hall.com/  This test is not yet approved or  cleared by the [place] FDA and has been authorized for detection and/or diagnosis of SARS-CoV-2 by FDA under an Emergency Use Authorization (EUA).  This EUA will remain in effect (meaning this test can be used) for the duration of the COVID-19 declaration under Section 564(b)(1) of the Act, 21 U.S.C. section 360bbb-3(b)(1), unless the authorization is terminated or revoked sooner.  Performed at [facility], [address] [place],  [address]      Time coordinating  discharge: 35 minutes  SIGNED:   Rodena Goldmann, DO [facility] 04/03/2022, 9:58 AM  If 7PM-7AM, please contact night-coverage [email or web address]

## 2022-04-03 NOTE — Progress Notes (Signed)
[name] for  Warfarin Indication: atrial fibrillation / MVR  Allergies  Allergen Reactions   Amiodarone Rash    Patient Measurements: Height: 5' 2.5" (158.8 cm) Weight: 75.1 kg (165 lb 9.6 oz) IBW/kg (Calculated) : 51.25   Vital Signs: Temp: 97.8 F (36.6 C) (07/02 0437) Temp Source: Oral (07/02 0437) BP: 112/67 (07/02 0437) Pulse Rate: 100 (07/02 0437)  Labs: Recent Labs    03/31/22 1040 03/31/22 1309 04/01/22 0417 04/02/22 0515 04/03/22 0537  HGB 12.2  --  11.8* 12.6  --   HCT 41.2  --  38.5 39.4  --   PLT 173  --  168 164  --   LABPROT 28.4*  --  26.8* 28.5* 27.1*  INR 2.7*  --  2.5* 2.7* 2.5*  CREATININE 1.00  --  0.99 0.88 0.80  TROPONINIHS 11 10  --   --   --      Estimated Creatinine Clearance: 56.5 mL/min (by C-G formula based on SCr of 0.8 mg/dL).   Assessment: 76 year old female on warfarin PTA for mitral valve and a-fib  Dose PTA 3.75 mg every Thursday, Sunday, 2.5 mg other days  INR therapeutic at 2.5  Goal of Therapy:  INR 2.5 to 3.5 Monitor platelets by anticoagulation protocol: Yes   Plan:  Warfarin 3.75 mg x  1 dose Daily INR for now  [name], PharmD Clinical Pharmacist 04/03/2022 7:59 AM

## 2022-04-04 ENCOUNTER — Ambulatory Visit (INDEPENDENT_AMBULATORY_CARE_PROVIDER_SITE_OTHER): Payer: Medicare PPO | Admitting: *Deleted

## 2022-04-04 DIAGNOSIS — I4891 Unspecified atrial fibrillation: Secondary | ICD-10-CM | POA: Diagnosis not present

## 2022-04-04 DIAGNOSIS — I639 Cerebral infarction, unspecified: Secondary | ICD-10-CM | POA: Diagnosis not present

## 2022-04-04 DIAGNOSIS — Z5181 Encounter for therapeutic drug level monitoring: Secondary | ICD-10-CM | POA: Diagnosis not present

## 2022-04-04 DIAGNOSIS — Z952 Presence of prosthetic heart valve: Secondary | ICD-10-CM | POA: Diagnosis not present

## 2022-04-04 LAB — POCT INR: INR: 3.4 — AB (ref 2.0–3.0)

## 2022-04-04 NOTE — Patient Instructions (Signed)
Continue warfarin 1 tablet daily except for 1.5 tablets on Sundays and Thursdays. Recheck INR in 2 weeks.

## 2022-04-07 ENCOUNTER — Encounter: Payer: Self-pay | Admitting: Cardiology

## 2022-04-07 DIAGNOSIS — N1831 Chronic kidney disease, stage 3a: Secondary | ICD-10-CM | POA: Diagnosis not present

## 2022-04-07 DIAGNOSIS — I5032 Chronic diastolic (congestive) heart failure: Secondary | ICD-10-CM | POA: Diagnosis not present

## 2022-04-07 DIAGNOSIS — E1129 Type 2 diabetes mellitus with other diabetic kidney complication: Secondary | ICD-10-CM | POA: Diagnosis not present

## 2022-04-07 DIAGNOSIS — E039 Hypothyroidism, unspecified: Secondary | ICD-10-CM | POA: Diagnosis not present

## 2022-04-07 DIAGNOSIS — Z79899 Other long term (current) drug therapy: Secondary | ICD-10-CM | POA: Diagnosis not present

## 2022-04-11 DIAGNOSIS — N182 Chronic kidney disease, stage 2 (mild): Secondary | ICD-10-CM | POA: Diagnosis not present

## 2022-04-11 DIAGNOSIS — E1122 Type 2 diabetes mellitus with diabetic chronic kidney disease: Secondary | ICD-10-CM | POA: Diagnosis not present

## 2022-04-11 DIAGNOSIS — I130 Hypertensive heart and chronic kidney disease with heart failure and stage 1 through stage 4 chronic kidney disease, or unspecified chronic kidney disease: Secondary | ICD-10-CM | POA: Diagnosis not present

## 2022-04-11 DIAGNOSIS — R059 Cough, unspecified: Secondary | ICD-10-CM | POA: Diagnosis not present

## 2022-04-12 ENCOUNTER — Other Ambulatory Visit: Payer: Self-pay | Admitting: Internal Medicine

## 2022-04-14 DIAGNOSIS — E039 Hypothyroidism, unspecified: Secondary | ICD-10-CM | POA: Diagnosis not present

## 2022-04-14 DIAGNOSIS — R7309 Other abnormal glucose: Secondary | ICD-10-CM | POA: Diagnosis not present

## 2022-04-14 DIAGNOSIS — I5033 Acute on chronic diastolic (congestive) heart failure: Secondary | ICD-10-CM | POA: Diagnosis not present

## 2022-04-14 DIAGNOSIS — E1122 Type 2 diabetes mellitus with diabetic chronic kidney disease: Secondary | ICD-10-CM | POA: Diagnosis not present

## 2022-04-18 ENCOUNTER — Ambulatory Visit (INDEPENDENT_AMBULATORY_CARE_PROVIDER_SITE_OTHER): Payer: Medicare PPO | Admitting: *Deleted

## 2022-04-18 DIAGNOSIS — Z5181 Encounter for therapeutic drug level monitoring: Secondary | ICD-10-CM

## 2022-04-18 DIAGNOSIS — I639 Cerebral infarction, unspecified: Secondary | ICD-10-CM

## 2022-04-18 DIAGNOSIS — I4891 Unspecified atrial fibrillation: Secondary | ICD-10-CM

## 2022-04-18 LAB — POCT INR: INR: 2.7 (ref 2.0–3.0)

## 2022-04-18 NOTE — Patient Instructions (Signed)
Continue warfarin 1 tablet daily except for 1.5 tablets on Sundays and Thursdays. Recheck INR in 4 weeks. 

## 2022-04-20 ENCOUNTER — Ambulatory Visit: Payer: Medicare PPO | Admitting: Internal Medicine

## 2022-04-20 ENCOUNTER — Encounter: Payer: Self-pay | Admitting: Internal Medicine

## 2022-04-20 ENCOUNTER — Encounter: Payer: Self-pay | Admitting: Cardiology

## 2022-04-20 DIAGNOSIS — Z79899 Other long term (current) drug therapy: Secondary | ICD-10-CM | POA: Diagnosis not present

## 2022-04-20 DIAGNOSIS — E1122 Type 2 diabetes mellitus with diabetic chronic kidney disease: Secondary | ICD-10-CM | POA: Diagnosis not present

## 2022-04-20 DIAGNOSIS — R0609 Other forms of dyspnea: Secondary | ICD-10-CM | POA: Diagnosis not present

## 2022-04-20 DIAGNOSIS — R058 Other specified cough: Secondary | ICD-10-CM

## 2022-04-20 DIAGNOSIS — E039 Hypothyroidism, unspecified: Secondary | ICD-10-CM | POA: Diagnosis not present

## 2022-04-20 DIAGNOSIS — I5033 Acute on chronic diastolic (congestive) heart failure: Secondary | ICD-10-CM | POA: Diagnosis not present

## 2022-04-20 NOTE — Assessment & Plan Note (Signed)
Original onset 2010 on ACEi  Spiro 09/2012: normal  Sinus ct 2014:  No chronic sinus disease. 10/16/12 Meth challenge  + for hyperreactive airways disease 04/30/20 CT head sinus nl - 06/23/2020  After extensive coaching inhaler device,  effectiveness =    75% from a baseline of 25% > continue flovent 44 2bid and prn saba plus max gerd rx  08/05/2020    changed to symb 80 2bid and given depomedrol 120 mg IM  > no better - 09/16/2020  After extensive coaching inhaler device,  effectiveness =    50% baseline of less than 25% - 09/16/2020 started gabapentin titrate up to 100 mg qid  -  11/26/20  Sinus CT neg  - Allergy profile 07/22/2021 >  Eos 0.2 /  IgE  15 - 09/14/2021  After extensive coaching inhaler device,  effectiveness =    0% so changed to alb/bud bid > 12/13/2021 did not follow instructions so try again same rx  With  Prednisone 10 mg take  4 each am x 2 days,   2 each am x 2 days,  1 each am x 2 days and stop and bud 0.25 mg bid combined with albuterol since can't afford performist on her plan.  She clearly (by response to rx) has elements of both cough variant asthma and Upper airway cough syndrome (previously labeled PNDS),  is so named because it's frequently impossible to sort out how much is  CR/sinusitis with freq throat clearing (which can be related to primary GERD)   vs  causing  secondary (" extra esophageal")  GERD from wide swings in gastric pressure that occur with throat clearing, often  promoting self use of mint and menthol lozenges that reduce the lower esophageal sphincter tone and exacerbate the problem further in a cyclical fashion.   These are the same pts (now being labeled as having "irritable larynx syndrome" by some cough centers) who not infrequently have a history of having failed to tolerate ace inhibitors,  dry powder inhalers or biphosphonates or report having atypical/extraesophageal reflux symptoms that don't respond to standard doses of PPI  and are easily confused  as having aecopd or asthma flares by even experienced allergists/ pulmonologists (myself included).   rec  No change rx F/u q 3 m

## 2022-04-20 NOTE — Patient Instructions (Addendum)
No change in recommendations  Please schedule a follow up visit in 3 months but call sooner if needed  

## 2022-04-20 NOTE — Progress Notes (Signed)
[name], female    DOB: [birth year]      MRN: [id number]   Brief patient profile:  76  yowf never smoker with a h/o sob related to valvular heart dz    Seen prior to Epic in [place]: Feb 20, 2009 initial pulmonary eval started aerobics 3 months prior to OV  X 30 min weekly and tolerated well with improving ex tol until 3 weeks prior to OV   noted decrease tolerance and since then gradually worse to point where walking to mailbox slt uphill or climbing steps has to stop at top.  In retrospect coughing for 3 months dry, worse immediately on lying down. due to the cough lisnopril was stopped and could not tolerate the substitute and cough hasn't changed but breathing got worse. prednisone no help so far and proaire no benefit. rec GERD diet   continue prilosec and reglan stop prednisone and proaire  avoid salt  maxzide 25 one each am and Dr Izell Falcon office   10/16/12 Meth challenge  + for hyperreactive airways disease  clance eval 05/14/13 [name] that at this point is clearly upper airway in origin.  [name], but her current cough failed to respond to prednisone tapers.  Therefore it is unlikely that it is related to cough variant asthma.  She does not have chronic sinusitis by her CT scan, and there is nothing on chest x-ray earlier in the year to explain her cough.  She does have a history of breast cancer, but there has been no evidence for recurrence.  [name], but this has an extremely low yield.  [name], and therefore I am concerned she may be having laryngopharyngeal reflux as a cause for her cough.  I also think she may have a cyclical mechanism as well she did have some response to tramadol, and we could consider a trial of gabapentin  AVR  07/18/13   04/2020 echo  1. Left ventricular ejection fraction, by estimation, is 60 to 65%. The   left ventricle has normal function. The left ventricle has no regional  wall motion abnormalities. Left ventricular diastolic parameters are  indeterminate.   2. Right ventricular systolic function is low normal. The right  ventricular size is normal. There is moderately elevated pulmonary artery  systolic pressure. The estimated right ventricular systolic pressure is  42.3 mmHg.   3. Left atrial size was mildly dilated.   4. The mitral valve has been repaired/replaced. No evidence of mitral  valve regurgitation. The mean mitral valve gradient is 5.0 mmHg. There is  a 25 mm Magna Ease bioprosthetic valve present in the mitral position.  Grossly normal function.   5. The aortic valve has been repaired/replaced. Aortic valve  regurgitation is not visualized. There is a 21 mm Magna Ease bioprosthetic  valve present in the aortic position. Aortic valve mean gradient measures  7.0 mmHg. Grossly normal function.   6. The inferior vena cava is normal in size with greater than 50%  respiratory variability, suggesting right atrial pressure of 3 mmHg.        History of Present Illness  06/23/2020  Pulmonary/ 1st office eval/ [name] / [employer] Office / re establish re cough and sob on flovent 44 2bid  For cough variant asthma  Chief Complaint  Patient presents with   Consult    Patient has  a dry cough that has been going on for many years but worse over the last 6 months. Shortness of breath with exertion.   Dyspnea:  Walks dog x 30 min / mostly flat uses cane to balance/ steps ok slowly due to breathing problems x sev years prior to OV   Cough: worse in afternoons and sometimes hs has to sit up 30 degrees to prevent cough supine  Sleep: ok as long as head is up 30 degrees new x 2 years  SABA use: sev times a day seems to help at 4 pm rec GERD diet  Pantoprazole (protonix) 40 mg   Take  30-60 min before first meal of the day and Pepcid (famotidine)  20 mg one after supper until return to  office - this is the best way to tell whether stomach acid is contributing to your problem.   Plan A = Automatic = Always=    flovent Take 2 puffs first thing in am and then another 2 puffs about 12 hours later.  Work on inhaler technique:  Plan B = Backup (to supplement plan A, not to replace it) Only use your albuterol inhaler as a rescue medication   Please schedule a follow up office visit in 6 weeks, call sooner if needed - bring inhalers  Next step ? Gabapentin?   08/05/2020  f/u ov/[place] office/[name] re: cough > sob x years  Chief Complaint  Patient presents with   Follow-up    non productive cough   Dyspnea:  Not limited by breathing from desired activities  / plays golf/ walks dog  Cough: dry ? Worse with activity (husband says not reproducible ?  worse p supper  Sleeping: most nights waking up  SABA use: once a day  02: none  rec Depomedrol 120 mg IM  Plan A = Automatic = Always=    symbicort 80 Take 2 puffs first thing in am and then another 2 puffs about 12 hours later.  Work on inhaler technique:     Plan B = Backup (to supplement plan A, not to replace it) Only use your albuterol inhaler as a rescue medication   Please schedule a follow up office visit in 6 weeks, call sooner if needed  - bring inhalers and the empty cannister of symbicort    09/16/2020  f/u ov/[place] office/[name] re: cough  x years ? Decades  Feb 20, 2009 initial pulmonary eval started aerobics 3 months prior to OV  X 30 min weekly and tolerated well with improving ex tol until 3 weeks prior to OV   noted decrease tolerance and since then gradually worse to point where walking to mailbox slt uphill or climbing steps has to stop at top.  In retrospect coughing for 3 months dry, worse immediately on lying down. due to the cough lisnopril was stopped and could not tolerate the substitute and cough hasn't changed but breathing got worse. prednisone no help so far and proaire no benefit. rec GERD diet   continue prilosec and reglan stop prednisone and proaire  avoid salt  maxzide 25 one each am and Dr Izell Falcon office   10/16/12 Meth challenge  + for hyperreactive airways disease  clance eval 05/14/13 [name] that at this point is clearly upper airway in origin.  [name], but her current cough failed to respond to prednisone tapers.  Therefore it is unlikely that it is related to cough variant asthma.  She does not have chronic sinusitis by her CT scan, and there is nothing on chest x-ray earlier in the year to explain her cough.  She does have a history of breast cancer, but there has been no evidence for recurrence.  [name], but this has an extremely low yield.  [name], and therefore I am concerned she may be having laryngopharyngeal reflux as a cause for her cough.  I also think she may have a cyclical mechanism as well she did have some response to tramadol, and we could consider a trial of gabapentin  AVR  07/18/13   04/2020 echo  1. Left ventricular ejection fraction, by estimation, is 60 to 65%. The   left ventricle has normal function. The left ventricle has no regional  wall motion abnormalities. Left ventricular diastolic parameters are  indeterminate.   2. Right ventricular systolic function is low normal. The right  ventricular size is normal. There is moderately elevated pulmonary artery  systolic pressure. The estimated right ventricular systolic pressure is  42.3 mmHg.   3. Left atrial size was mildly dilated.   4. The mitral valve has been repaired/replaced. No evidence of mitral  valve regurgitation. The mean mitral valve gradient is 5.0 mmHg. There is  a 25 mm Magna Ease bioprosthetic valve present in the mitral position.  Grossly normal function.   5. The aortic valve has been repaired/replaced. Aortic valve  regurgitation is not visualized. There is a 21 mm Magna Ease bioprosthetic  valve present in the aortic position. Aortic valve mean gradient measures  7.0 mmHg. Grossly normal function.   6. The inferior vena cava is normal in size with greater than 50%  respiratory variability, suggesting right atrial pressure of 3 mmHg.        History of Present Illness  06/23/2020  Pulmonary/ 1st office eval/ [name] / [employer] Office / re establish re cough and sob on flovent 44 2bid  For cough variant asthma  Chief Complaint  Patient presents with   Consult    Patient has  a dry cough that has been going on for many years but worse over the last 6 months. Shortness of breath with exertion.   Dyspnea:  Walks dog x 30 min / mostly flat uses cane to balance/ steps ok slowly due to breathing problems x sev years prior to OV   Cough: worse in afternoons and sometimes hs has to sit up 30 degrees to prevent cough supine  Sleep: ok as long as head is up 30 degrees new x 2 years  SABA use: sev times a day seems to help at 4 pm rec GERD diet  Pantoprazole (protonix) 40 mg   Take  30-60 min before first meal of the day and Pepcid (famotidine)  20 mg one after supper until return to  office - this is the best way to tell whether stomach acid is contributing to your problem.   Plan A = Automatic = Always=    flovent Take 2 puffs first thing in am and then another 2 puffs about 12 hours later.  Work on inhaler technique:  Plan B = Backup (to supplement plan A, not to replace it) Only use your albuterol inhaler as a rescue medication   Please schedule a follow up office visit in 6 weeks, call sooner if needed - bring inhalers  Next step ? Gabapentin?   08/05/2020  f/u ov/[place] office/[name] re: cough > sob x years  Chief Complaint  Patient presents with   Follow-up    non productive cough   Dyspnea:  Not limited by breathing from desired activities  / plays golf/ walks dog  Cough: dry ? Worse with activity (husband says not reproducible ?  worse p supper  Sleeping: most nights waking up  SABA use: once a day  02: none  rec Depomedrol 120 mg IM  Plan A = Automatic = Always=    symbicort 80 Take 2 puffs first thing in am and then another 2 puffs about 12 hours later.  Work on inhaler technique:     Plan B = Backup (to supplement plan A, not to replace it) Only use your albuterol inhaler as a rescue medication   Please schedule a follow up office visit in 6 weeks, call sooner if needed  - bring inhalers and the empty cannister of symbicort    09/16/2020  f/u ov/[place] office/[name] re: cough  x years ? Decades "nothing has ever helped" no better p depo last ov, not ever transiently  Chief Complaint  Patient presents with   Follow-up    Cough is unchanged since the last visit- prod with white sputum.  She is using her albuterol inhaler once per day usually around 4-5 pm.   Dyspnea:  Walks dog, uses cane / some balance issues limiting more than sob and still plays golf Cough: worse  p supper despite pepcid Sleeping: does better in recliner 30 degrees  SABA use: once a day around 5pm seems to help though note hfa very poor baseline  02: none Work on inhaler  technique:   Gabapentin 100 mg twice a day for a week then 4 x daily  Please remember to go to the  x-ray department  @  [facility] for your tests - we will call you with the results when they are available    Add Needs f/u cxr  ? rml syndrome p rx mucinex dm > then ct chest/ sinus if not better  and check allergy profile on return  10/29/2020  f/u ov/[place] office/[name] re: chronic cough  Chief Complaint  Patient presents with   Followup     Cough is unchanged since the last visit. She has used her albuterol inhaler twice since her last visit.   Dyspnea:  Slowed more balance than breathing, walks with suction cup cane Cough: dry cough day > noct Sleeping: able to sleep sitting up 30 degrees s sob or cough  SABA use: rare 02: none  Rec We will call to schedule you CT sinus (neg) and chest = ? Scarring from Breast RT (note surgery/RT in early 90's and cough started p that      07/22/2021  f/u ov/[name] re: cough > sob    maint on flovent  for dx of MCT pos cough variant asthma / stopped gabapentin ? Why ? Chief Complaint  Patient presents with   Follow-up    Productive cough with thick white mucus   Dyspnea:  mailbox and back is 200 yards slow pace Cough: daytime cough / lots of throat clearing  Sleeping: 30 degrees ok  SABA use: using saba bid/ poor flovent use  02: none  Covid status:   vax x 5  Rec Pantoprazole (protonix) 40 mg   Take  30-60 min before first meal of the day and Pepcid (famotidine)  20 mg after supper until return to office   Gabapentin 100 mg four times daily  Plan A = Automatic = Always=   Symbicort 80 mg  Take 2 puffs first thing in am and then another 2 puffs about 12 hours later. (Stop flovent and atrovent- unless can't afford symbicort on your plan) Plan B = Backup (to supplement plan A, not to replace it) Only use your albuterol inhaler as a rescue medication   Plan C = Crisis (instead of Plan B but only if Plan B stops working) - only  use your albuterol nebulizer if you first try Plan B   For cough > mucinex dm 1200 every 12 hours as needed      01/21/2022  f/u ov/ office/[name] re: uacs vs cough variant maint on alb/bud 0.25 mg bid   Chief Complaint  Patient presents with   Follow-up    Cough sob same since last ov    Dyspnea:  still walking dog and does better  if times it after am  albuterol  Cough: dry worse with allergies rx zyrtec works [employer]: 30 degrees hob  SABA use: neb twice daily with budesonide, no need for prn 02: none  Rec No change in medications  When symptoms flare go ahead and take the whole tube of budesonide = 0.5 mg until better then back to a half a tube   Admit date: 03/31/2022 Discharge date: 04/03/2022  Brief/Interim Summary: [name] is a 76 y.o. female with medical history significant for COPD and asthma, atrial fibrillation, stroke, aortic and mitral valve replacement, on chronic anticoagulation, diabetes mellitus. Patient presented to the ED with reports of difficulty breathing of 2 days duration.  She was admitted with acute hypoxemic respiratory failure in the setting of volume overload.  Pneumonia was initially suspected, but was ruled out and antibiotics were discontinued.  2D echocardiogram with preserved LVEF and suspected diastolic dysfunction noted.  She has diuresed quite well and is under her baseline weight of 173 pounds to 165 pounds at this time.  She has diuresed nearly 2 L of fluid and has no further oxygen requirements or significant edema. REDS Clip  reading 36%.  She is eager to go home and will have her Lasix dose increased to 40 mg daily as opposed to as needed dosing previously.  She continues to have mild hypokalemia for which her potassium supplementation has been increased as well.  No other acute events noted during the course of the stay.   Discharge Diagnoses:  Principal Problem:   Acute respiratory failure with hypoxia (HCC) Active  Problems:   Acute congestive heart failure (HCC)   Pneumonia   Essential hypertension   Chronic anticoagulation   Cardioembolic stroke [facility])   Atrial fibrillation (HCC)   Status post aortic valve and mitral valve replacement   Type 2 diabetes mellitus treated without insulin ([place])   Principal discharge diagnosis: Acute hypoxemic respiratory failure likely secondary to volume overload with acute on chronic diastolic CHF exacerbation.  04/20/2022  f/u ov/[place] office/[name] re: cough x 2010  maint on gerd rx/ alb neb bid with bud 0.5 mg bid   Chief Complaint  Patient presents with   Follow-up    SOB has not improved. Cough is getting better. Patient had a hospital admission 6/29-7/2 at [place]   Dyspnea:  only walking dog x 15 min then quits due to sob  Cough: better now  Sleeping: 30 degrees hob  SABA use: bid neb only / usually twice daily   02: none     No obvious day to day or daytime variability or assoc excess/ purulent sputum or mucus plugs or hemoptysis or cp or chest tightness, subjective wheeze or overt sinus or hb symptoms.   Sleeping as above  without nocturnal  or early am exacerbation  of respiratory  c/o's or need for noct saba. Also denies any obvious fluctuation of symptoms with weather or environmental changes or other aggravating or alleviating factors except as outlined above   No unusual exposure hx or h/o childhood pna/ asthma or knowledge of premature birth.  Current Allergies, Complete Past Medical History, Past Surgical History, Family History, and Social History were reviewed in [employer] record.  ROS  The following are not active complaints unless bolded Hoarseness, sore throat, dysphagia, dental problems, itching, sneezing,  nasal congestion or discharge of excess mucus or purulent secretions, ear ache,   fever, chills, sweats, unintended wt loss or wt gain, classically pleuritic or exertional cp,  orthopnea pnd or arm/hand  swelling  or leg swelling, presyncope, palpitations, abdominal pain, anorexia, nausea, vomiting, diarrhea  or change in bowel habits or change in bladder habits, change in stools or change in urine, dysuria, hematuria,  rash, arthralgias, visual complaints, headache, numbness, weakness or ataxia or problems with walking or coordination,  change in mood or  memory.        Current Meds  Medication Sig   albuterol (PROVENTIL) (2.5 MG/3ML) 0.083% nebulizer solution Take 3 mLs (2.5 mg total) by nebulization every 4 (four) hours as needed for wheezing or shortness of breath. (Patient taking differently: Take 2.5 mg by nebulization 2 (two) times daily as needed for wheezing or shortness of breath.)   atorvastatin (LIPITOR) 40 MG tablet TAKE ONE TABLET BY MOUTH ONCE DAILY. (Patient taking differently: Take 40 mg by mouth daily.)   budesonide (PULMICORT) 0.25 MG/2ML nebulizer solution One 0.25 mg  twice daily  along with albuterol   cetirizine (ZYRTEC) 10 MG tablet Take 10 mg by mouth daily.   chlorhexidine (PERIDEX) 0.12 % solution Use as directed 15 mLs in the mouth or throat 2 (two) times daily.  dextromethorphan-guaiFENesin (MUCINEX DM) 30-600 MG 12hr tablet Take 1 tablet by mouth 2 (two) times daily.   diclofenac Sodium (VOLTAREN) 1 % GEL Apply 2 g topically 4 (four) times daily.   diltiazem (CARDIZEM CD) 180 MG 24 hr capsule Take 1 capsule (180 mg total) by mouth daily.   escitalopram (LEXAPRO) 10 MG tablet Take 10 mg by mouth daily.    famotidine (PEPCID) 20 MG tablet TAKE 1 TABLET AFTER SUPPER (Patient taking differently: Take 20 mg by mouth at bedtime.)   fluticasone (FLONASE) 50 MCG/ACT nasal spray Place 1 spray into both nostrils daily as needed for allergies.    furosemide (LASIX) 40 MG tablet Take 1 tablet (40 mg total) by mouth daily. Can take an extra '40mg'$  as needed for swelling, or 3lb weight gain in 24 hours. (Patient taking differently: Take 40 mg by mouth daily. Can take an extra '40mg'$  as  needed for swelling, or 3lb weight gain in 24 hours.  As of 7/19- 60 mg 2X/day)   gabapentin (NEURONTIN) 100 MG capsule TAKE 1 CAPSULE BY MOUTH FOUR TIMES DAILY. (Patient taking differently: Take 100 mg by mouth 4 (four) times daily.)   levothyroxine (SYNTHROID) 100 MCG tablet Take 100 mcg by mouth daily before breakfast.   metFORMIN (GLUCOPHAGE) 500 MG tablet Take 1 tablet (500 mg total) by mouth 2 (two) times daily with a meal.   Multiple Vitamin (MULITIVITAMIN WITH MINERALS) TABS Take 1 tablet by mouth daily.   olmesartan (BENICAR) 5 MG tablet Take 5 mg by mouth daily.   ondansetron (ZOFRAN) 4 MG tablet Take 1 tablet (4 mg total) by mouth every 8 (eight) hours as needed for nausea or vomiting.   pantoprazole (PROTONIX) 40 MG tablet TAKE 1 TABLET 30 TO 60 MINUTES BEFORE FIRST MEAL OF THE DAY.   potassium chloride SA (KLOR-CON M) 20 MEQ tablet Take 1 tablet (20 mEq total) by mouth 2 (two) times daily.   PREVIDENT 5000 BOOSTER PLUS 1.1 % PSTE Place 1 application  onto teeth daily.   warfarin (COUMADIN) 2.5 MG tablet TAKE AS DIRECTED BY COUMADIN CLINIC. (Patient taking differently: Take 2.5 mg by mouth See admin instructions. Take 2.5 daily and on Sunday and Thursday add additional 1/2 tablet)                 Past Medical History:  Diagnosis Date   Anemia    Atrial fibrillation ([place])    Breast carcinoma ([place]) 1995   2671   Cardioembolic stroke ([place]) 11/4578   Right frontal in 01/2012; normal carotid ultrasound; possible LAA thrombus by TEE; virtual complete neurologic recovery   Chronic kidney disease, stage 2, mildly decreased GFR    GFR of approximately 60   Depression    Diverticulosis of colon (without mention of hemorrhage) 2012   Dr. Laural Golden   Fasting hyperglycemia    120 fasting   Gastroesophageal reflux disease    Gastroparesis    Hemorrhoids    Hyperlipidemia    Hypertension    pt denies 05/30/13     Dr Kathe Mariner   Hyponatremia    Rheumatic heart disease    a.  07/2013 Echo: Ef 55-60%, Mod AS/AI, Mod-Sev MS, sev dil LA, PASP 58;  b. 07/2013 TEE: EF 35-40%, mild-mod AS/AI, mod-sev MS, LA smoke;  c. 07/2013 Cath: elev R heart pressures, nl cors.   Shortness of breath     Past Medical History:    Hx of NEOPLASM, MALIGNANT, BREAST (ICD-174.9)    HYPERLIPIDEMIA (  ICD-272.4)    ASTHMA (ICD-493.90)    GERD with gastroparesis   Past Surgical History:    Breast surgery 1995    Heart valve surgery 1962 age 71 [facility] Mitral valve     Tubal ligation-1973   Objective:     Wt  04/20/2022        170  01/21/2022        177  12/13/2021        177 09/14/2021      170  07/22/2021      162  10/29/2020        177 09/16/2020      178   08/05/20 178 lb 12.8 oz (81.1 kg)  06/25/20 194 lb (88 kg)  06/23/20 180 lb (81.6 kg)     Vital signs reviewed  04/20/2022  - Note at rest 02 sats  93% on RA   General appearance:    amb with walker    HEENT : Oropharynx  clear/ dentition ok      Nasal turbinates nl   NECK :  without  apparent JVD/ palpable Nodes/TM    LUNGS: no acc muscle use,  Nl contour chest which is clear to A and P bilaterally without cough on insp or exp maneuvers   CV:  RRR  no s3  2-3/6 sem s increase in P2, and pitting  edema L > R LE  ABD:  soft and nontender with nl inspiratory excursion in the supine position. No bruits or organomegaly appreciated   MS:  Nl gait/ ext warm without deformities Or obvious joint restrictions  calf tenderness, cyanosis or clubbing    SKIN: warm and dry without lesions    NEURO:  alert, approp, nl sensorium with  no motor or cerebellar deficits apparent.          I personally reviewed images and agree with radiology impression as follows:  Chest CT with contrast  03/31/22  New small right pleural effusion and tiny left pleural effusion. No evidence of pulmonary mass or consolidation.  Stable calcified left pleural plaque, and calcified granulomas in left lung.    Assessment

## 2022-04-20 NOTE — Assessment & Plan Note (Addendum)
Onset 2010 assoc with valvular heart dz  S/p AVR, MVR  12/13/2021   Walked on RA  x  3  lap(s) =  approx 450  ft  @ slow pace, stopped due to end of study/ sob with lowest 02 sats 93%  - Echo 04/01/22  1. Left ventricular ejection fraction, by estimation, is 60%. The left  ventricle has normal function. The left ventricle has no regional wall  motion abnormalities. Left ventricular diastolic function could not be  evaluated.  2. Caclified papillary muscles.  3. Right ventricular systolic function is normal. The right ventricular  size is normal. There is normal pulmonary artery systolic pressure.  4. Left atrial size was moderately dilated. 04/20/2022   Walked on RA  x  3  lap(s) =  approx 450   ft  @ moderate/cane pace, stopped due to end of study with lowest 02 sats 91% and mild sob starting on 2nd lap   Improving since admit largely related to diuresis noting biliateral small effusions on CT c/w chf  > diuretic rx per cards/ Dr Willey Blade   Each maintenance medication was reviewed in detail including emphasizing most importantly the difference between maintenance and prns and under what circumstances the prns are to be triggered using an action plan format where appropriate.  Total time for H and P, chart review, counseling, reviewing neb2 device(s) , directly observing portions of ambulatory 02 saturation study/ and generating customized AVS unique to this office visit / same day charting = 30 min

## 2022-04-21 ENCOUNTER — Encounter: Payer: Self-pay | Admitting: Internal Medicine

## 2022-04-21 DIAGNOSIS — R059 Cough, unspecified: Secondary | ICD-10-CM | POA: Diagnosis not present

## 2022-04-21 DIAGNOSIS — I5032 Chronic diastolic (congestive) heart failure: Secondary | ICD-10-CM | POA: Diagnosis not present

## 2022-04-22 ENCOUNTER — Ambulatory Visit: Payer: Medicare PPO | Admitting: Internal Medicine

## 2022-05-03 ENCOUNTER — Other Ambulatory Visit: Payer: Self-pay | Admitting: Internal Medicine

## 2022-05-03 DIAGNOSIS — R0609 Other forms of dyspnea: Secondary | ICD-10-CM

## 2022-05-04 ENCOUNTER — Ambulatory Visit: Payer: Medicare PPO | Admitting: Cardiology

## 2022-05-04 ENCOUNTER — Encounter: Payer: Self-pay | Admitting: Cardiology

## 2022-05-04 VITALS — BP 100/55 | HR 52 | Ht 62.5 in | Wt 171.2 lb

## 2022-05-04 DIAGNOSIS — I38 Endocarditis, valve unspecified: Secondary | ICD-10-CM

## 2022-05-04 DIAGNOSIS — I4891 Unspecified atrial fibrillation: Secondary | ICD-10-CM

## 2022-05-04 DIAGNOSIS — I5032 Chronic diastolic (congestive) heart failure: Secondary | ICD-10-CM

## 2022-05-04 DIAGNOSIS — Z79899 Other long term (current) drug therapy: Secondary | ICD-10-CM | POA: Diagnosis not present

## 2022-05-04 DIAGNOSIS — E1129 Type 2 diabetes mellitus with other diabetic kidney complication: Secondary | ICD-10-CM | POA: Diagnosis not present

## 2022-05-04 DIAGNOSIS — N1831 Chronic kidney disease, stage 3a: Secondary | ICD-10-CM | POA: Diagnosis not present

## 2022-05-04 DIAGNOSIS — R0609 Other forms of dyspnea: Secondary | ICD-10-CM | POA: Diagnosis not present

## 2022-05-04 MED ORDER — FUROSEMIDE 40 MG PO TABS: ORAL_TABLET | ORAL | 0 refills | Status: DC

## 2022-05-04 NOTE — Progress Notes (Signed)
Clinical Summary [name] is a 76 y.o.femaleseen today for the following medical problems.      1. Valvular heart disease   - 07/18/13 patient underwent MVR with 25 mm Edwards Magna-ease pericardial valve and also AVR with 21 mm Edwards Magna-ease pericardial valve    -03/2022 echo: LVEF 40%, indet diastolic, normal MVR and AVR.     2.SOB -admitted 03/2022 with SOB, hypoxia - from notes O2 sats down to 89% on room air.  She reports dyspnea and cough.  Portable chest x-ray suggesting right base atelectasis or developing infection, also loculated pleural fluid in right lateral lung slightly increased from prior.  Also possible component of congestive heart failure. - 03/2022 echo: LVEF 97%, indet diastolic, normal MVR and AVR.  - BNP 297  - since discharge home weight 165-168 lbs - taking lasix '60mg'$  bid, recently increased by pcp - f/u labs showed stable Cr, K 3.8. Had another set of labs today with pcp - feeling good, no SOB/DOE - some recent orthostatic symptoms    3. HTN - home bp's 105/60s.     4. Afib   - hx of cardioembolic CVA, has been on longterm anticoag -- no recent palpitations - no bleeding on coumadin      4. Hyperlipidemia - recent labs with pcp, compliant with stin   06/2021 TC 185 TG 75 HDL 66 LDL 104     5. Carotid stenosis - 07/2013 [place] with mild bilateral stenosis 2018 mild disease - 01/2021 [place] mild bilateral disease     6. Syncope - admission 04/2020 with syncope - no acute findings by echo - CT head benign - Cr was elevated to 1.49   - weight loss - orthostatics show significant rise in HR with standing, HR lying 60 to 112 standing. Soft bp's that are low with standing to 86/50.    - ongoing fatigue x 3 months - some cough, SOB   - in garden picking beans, not sure how long. In the 90s that day - denies any significant prodrome.    - glassses x 4, coffee x 1 cup decaf, diet sun drop x 2 cans, decaf tea, no EtoH - chronic issues  with orthostatic dizziness with standing.    - we stopped her benicar and changed her lasix to '20mg'$  prn due to this episode - now back to lasix '20mg'$  daily due to increased swelling on just prn dosing.             SH: daughter with Crohns with disease, they spend a lot of time supporting her   Past Medical History:  Diagnosis Date   Anemia    Asthma    Atrial fibrillation ([place])    Breast carcinoma ([place]) 1995   3532   Cardioembolic stroke ([place]) 06/9241   Right frontal in 01/2012; normal carotid ultrasound; possible LAA thrombus by TEE; virtual complete neurologic recovery   Chronic kidney disease, stage 2, mildly decreased GFR    GFR of approximately 60   Depression    Diabetes mellitus without complication ([place])    Diverticulosis of colon (without mention of hemorrhage) 2012   Dr. Laural Golden   Fasting hyperglycemia    120 fasting   Gastroesophageal reflux disease    Gastroparesis    Hemorrhoids    Hyperlipidemia    Hypertension    pt denies 05/30/13     Dr Kathe Mariner   Hyponatremia    Rheumatic heart disease    a.  07/2013 Echo: Ef 55-60%, Mod AS/AI, Mod-Sev MS, sev dil LA, PASP 58;  b. 07/2013 TEE: EF 35-40%, mild-mod AS/AI, mod-sev MS, LA smoke;  c. 07/2013 Cath: elev R heart pressures, nl cors.   Shortness of breath      Allergies  Allergen Reactions   Amiodarone Rash     Current Outpatient Medications  Medication Sig Dispense Refill   albuterol (PROVENTIL) (2.5 MG/3ML) 0.083% nebulizer solution Take 3 mLs (2.5 mg total) by nebulization every 4 (four) hours as needed for wheezing or shortness of breath. (Patient taking differently: Take 2.5 mg by nebulization 2 (two) times daily as needed for wheezing or shortness of breath.) 75 mL 12   atorvastatin (LIPITOR) 40 MG tablet TAKE ONE TABLET BY MOUTH ONCE DAILY. (Patient taking differently: Take 40 mg by mouth daily.) 90 tablet 2   budesonide (PULMICORT) 0.25 MG/2ML nebulizer solution One 0.25 mg  twice daily  along  with albuterol 120 mL 11   cetirizine (ZYRTEC) 10 MG tablet Take 10 mg by mouth daily.     chlorhexidine (PERIDEX) 0.12 % solution Use as directed 15 mLs in the mouth or throat 2 (two) times daily.     dextromethorphan-guaiFENesin (MUCINEX DM) 30-600 MG 12hr tablet Take 1 tablet by mouth 2 (two) times daily.     diclofenac Sodium (VOLTAREN) 1 % GEL Apply 2 g topically 4 (four) times daily. 150 g 0   diltiazem (CARDIZEM CD) 180 MG 24 hr capsule Take 1 capsule (180 mg total) by mouth daily. 30 capsule 1   escitalopram (LEXAPRO) 10 MG tablet Take 10 mg by mouth daily.      famotidine (PEPCID) 20 MG tablet TAKE 1 TABLET AFTER SUPPER 30 tablet 0   fluticasone (FLONASE) 50 MCG/ACT nasal spray Place 1 spray into both nostrils daily as needed for allergies.      furosemide (LASIX) 40 MG tablet Take 1 tablet (40 mg total) by mouth daily. Can take an extra '40mg'$  as needed for swelling, or 3lb weight gain in 24 hours. (Patient taking differently: Take 40 mg by mouth daily. Can take an extra '40mg'$  as needed for swelling, or 3lb weight gain in 24 hours.  As of 7/19- 60 mg 2X/day) 30 tablet 11   gabapentin (NEURONTIN) 100 MG capsule TAKE 1 CAPSULE BY MOUTH FOUR TIMES DAILY. (Patient taking differently: Take 100 mg by mouth 4 (four) times daily.) 120 capsule 1   levothyroxine (SYNTHROID) 100 MCG tablet Take 100 mcg by mouth daily before breakfast.     metFORMIN (GLUCOPHAGE) 500 MG tablet Take 1 tablet (500 mg total) by mouth 2 (two) times daily with a meal. 60 tablet 1   metoprolol tartrate (LOPRESSOR) 25 MG tablet Take 1.5 tablets (37.5 mg total) by mouth 2 (two) times daily. 270 tablet 3   Multiple Vitamin (MULITIVITAMIN WITH MINERALS) TABS Take 1 tablet by mouth daily.     olmesartan (BENICAR) 5 MG tablet Take 5 mg by mouth daily.     ondansetron (ZOFRAN) 4 MG tablet Take 1 tablet (4 mg total) by mouth every 8 (eight) hours as needed for nausea or vomiting. 20 tablet 0   pantoprazole (PROTONIX) 40 MG tablet  TAKE 1 TABLET 30 TO 60 MINUTES BEFORE FIRST MEAL OF THE DAY. 30 tablet 11   potassium chloride SA (KLOR-CON M) 20 MEQ tablet Take 1 tablet (20 mEq total) by mouth 2 (two) times daily. 60 tablet 0   PREVIDENT 5000 BOOSTER PLUS 1.1 % PSTE Place 1 application  onto teeth daily.     warfarin (COUMADIN) 2.5 MG tablet TAKE AS DIRECTED BY COUMADIN CLINIC. (Patient taking differently: Take 2.5 mg by mouth See admin instructions. Take 2.5 daily and on Sunday and Thursday add additional 1/2 tablet) 45 tablet 5   No current facility-administered medications for this visit.     Past Surgical History:  Procedure Laterality Date   AORTIC VALVE REPLACEMENT N/A 07/18/2013   Procedure: AORTIC VALVE REPLACEMENT (AVR);  Surgeon: Gaye Pollack, MD;  Location: [facility];  Service: Open Heart Surgery;  Laterality: N/A;   BACK SURGERY     BREAST LUMPECTOMY Right 1995   CARDIAC CATHETERIZATION     CARDIOVERSION N/A 07/26/2013   Procedure: CARDIOVERSION;  Surgeon: Fay Records, MD;  Location: [place] [facility];  Service: Cardiovascular;  Laterality: N/A;   COLONOSCOPY  2012   Negative screening procedure   DILATION AND CURETTAGE OF UTERUS     ESOPHAGEAL MANOMETRY N/A 06/17/2013   Procedure: ESOPHAGEAL MANOMETRY (EM);  Surgeon: Sable Feil, MD;  Location: [facility];  Service: Endoscopy;  Laterality: N/A;   INTRAOPERATIVE TRANSESOPHAGEAL ECHOCARDIOGRAM N/A 07/18/2013   Procedure: INTRAOPERATIVE TRANSESOPHAGEAL ECHOCARDIOGRAM;  Surgeon: Gaye Pollack, MD;  Location: [facility];  Service: Open Heart Surgery;  Laterality: N/A;   LEFT AND RIGHT HEART CATHETERIZATION WITH CORONARY ANGIOGRAM N/A 07/10/2013   Procedure: LEFT AND RIGHT HEART CATHETERIZATION WITH CORONARY ANGIOGRAM;  Surgeon: Burn[place] Blanks, MD;  Location: [facility] CATH LAB;  Service: Cardiovascular;  Laterality: N/A;   LUMBAR LAMINECTOMY/DECOMPRESSION MICRODISCECTOMY Right 05/13/2014   Procedure: LUMBAR LAMINECTOMY/DECOMPRESSION MICRODISCECTOMY 1 LEVEL  lumbar  four/five;  Surgeon: Faythe Ghee, MD;  Location: [facility];  Service: Neurosurgery;  Laterality: Right;   MITRAL VALVE REPLACEMENT N/A 07/18/2013   Procedure: MITRAL VALVE (MV) REPLACEMENT;  Surgeon: Gaye Pollack, MD;  Location: [facility];  Service: Open Heart Surgery;  Laterality: N/A;   MITRAL VALVE [place], closed mitral valvulotomy by finger fracture   TEE WITHOUT CARDIOVERSION  01/24/2012   Procedure: TRANSESOPHAGEAL ECHOCARDIOGRAM (TEE);  Surgeon: Lelon Perla, MD;  Location[facility] ENDOSCOPY;  Service: Cardiovascular;  Laterality: N/A;   TEE WITHOUT CARDIOVERSION N/A 07/11/2013   Procedure: TRANSESOPHAGEAL ECHOCARDIOGRAM (TEE);  Surgeon: Thayer Headings, MD;  Location: [facility];  Service: Cardiovascular;  Laterality: N/A;   TEE WITHOUT CARDIOVERSION N/A 07/26/2013   Procedure: TRANSESOPHAGEAL ECHOCARDIOGRAM (TEE);  Surgeon: Fay Records, MD;  Location: [facility] ENDOSCOPY;  Service: Cardiovascular;  Laterality: N/A;   TUBAL LIGATION  1973     Allergies  Allergen Reactions   Amiodarone Rash      Family History  Problem Relation Age of Onset   Heart disease Brother 74       MI   Rheum arthritis Maternal Grandmother    Asthma Maternal Grandfather    Hypothyroidism Mother    Diabetes Sister    Cirrhosis Father      Social History Ms. Unterreiner reports that she has never smoked. She has never used smokeless tobacco. Ms. Waters reports no history of alcohol use.   Review of Systems CONSTITUTIONAL: No weight loss, fever, chills, weakness or fatigue.  HEENT: Eyes: No visual loss, blurred vision, double vision or yellow sclerae.No hearing loss, sneezing, congestion, runny nose or sore throat.  SKIN: No rash or itching.  CARDIOVASCULAR: per hpi RESPIRATORY: per hpi GASTROINTESTINAL: No anorexia, nausea, vomiting or diarrhea. No abdominal pain or blood.  GENITOURINARY: No burning on urination, no polyuria NEUROLOGICAL: No headache,  dizziness, syncope,  paralysis, ataxia, numbness or tingling in the extremities. No change in bowel or bladder control.  MUSCULOSKELETAL: No muscle, back pain, joint pain or stiffness.  LYMPHATICS: No enlarged nodes. No history of splenectomy.  PSYCHIATRIC: No history of depression or anxiety.  ENDOCRINOLOGIC: No reports of sweating, cold or heat intolerance. No polyuria or polydipsia.  Marland Kitchen   Physical Examination Today's Vitals   05/04/22 1457 05/04/22 1540  BP: (!) 90/50 (!) 100/55  Pulse: (!) 52   SpO2: 95%   Weight: 171 lb 3.2 oz (77.7 kg)   Height: 5' 2.5" (1.588 m)    Body mass index is 30.81 kg/m.  Gen: resting comfortably, no acute distress HEENT: no scleral icterus, pupils equal round and reactive, no palptable cervical adenopathy,  CV: RRR, no m/r/g no jvd Resp: Clear to auscultation bilaterally GI: abdomen is soft, non-tender, non-distended, normal bowel sounds, no hepatosplenomegaly MSK: extremities are warm, no edema.  Skin: warm, no rash Neuro:  no focal deficits Psych: appropriate affect   Diagnostic Studies  07/2013 TTE Study Conclusions  - Study data: Technically difficult study. - Left ventricle: The cavity size was normal. Wall thickness   was normal. Systolic function was normal. The estimated   ejection fraction was in the range of 55% to 60%.   Indeterminate diastolic function. There is evidence of   very high left atrial pressures (E/e' 63) - Aortic valve: Trileaflet; mildly thickened leaflets.   Accurate evaluation of aortic stenosis limited in the   setting of severe mitral stenosis and decreased   ventricular loadin. Morphologically there does not appear   to be significant stenosis. By gradient (mean PG 21 mmHg)   the stenosis is moderate, by AVA VTI (0.8) stenosis is   severe. Moderate regurgitation, AR PHT is 439 cm/s. Valve   area: 0.78cm^2(VTI). Valve area: 0.78cm^2 (Vmax). - Mitral valve: The findings are consistent with moderate to   severe stenosis. (Mean  PG 12 mmHg, MVA by PHT 1.3. Cannot   use MVA by VTI due to moderate AI. Valve area by pressure   half-time: 1.38cm^2. - Left atrium: The atrium was severely dilated. - Right ventricle: Systolic function was mildly reduced, RV   TAPSE is 1.4, TV systolic annular tissue velocity 10 cm/s. - Pulmonary arteries: Systolic pressure was moderately   increased. PA peak pressure: 40m Hg (S) (PASP based on a   fairly faint spectral Doppler waveform)     07/2013 TEE Study Conclusions  - Left ventricle: Systolic function was moderately reduced.   The estimated ejection fraction was in the range of 35% to   40%. - Aortic valve: There was mild to moderate stenosis. Mild to   moderate regurgitation. - Mitral valve: The findings are consistent with moderate to   severe stenosis. Valve area by pressure half-time:   1.45cm^2. - Left atrium: There was spontaneous echo contrast   ("smoke"). - Atrial septum: No defect or patent foramen ovale was   identified. - Tricuspid valve: No evidence of vegetation. Transesophageal echocardiography.  2D and color Doppler. Height:  Height: 157.5cm. Height: 62in.  Weight:  Weight: 75kg. Weight: 165lb.  Body mass index:  BMI: 30.2kg/m^2. Body surface area:    BSA: 1.868m.  Blood pressure: 143/56.  Patient status:  Inpatient.  Location:  [facility].      06/2018 echo Study Conclusions   - Left ventricle: The cavity size was normal. Wall thickness was   normal. Systolic function was normal. The estimated ejection   fraction  was in the range of 60% to 65%. - Aortic valve: AV prosthesis is difficult to see Peak and mean   gradients through the valve are 18 and 10 mm Hg respectively. - Mitral valve: Peak and mean gradients through the valve   prosthesis are 19 and 4 mm Hg respectively MVA by P T1/2 is 3   cm2. s/p MV prosthesis. Valve prosthesis is difficult to see. - Pulmonary arteries: PA peak pressure: 48 mm Hg (S).     04/2020 echo IMPRESSIONS     1.  Left ventricular ejection fraction, by estimation, is 60 to 65%. The  left ventricle has normal function. The left ventricle has no regional  wall motion abnormalities. Left ventricular diastolic parameters are  indeterminate.   2. Right ventricular systolic function is low normal. The right  ventricular size is normal. There is moderately elevated pulmonary artery  systolic pressure. The estimated right ventricular systolic pressure is  09.3 mmHg.   3. Left atrial size was mildly dilated.   4. The mitral valve has been repaired/replaced. No evidence of mitral  valve regurgitation. The mean mitral valve gradient is 5.0 mmHg. There is  a 25 mm Magna Ease bioprosthetic valve present in the mitral position.  Grossly normal function.   5. The aortic valve has been repaired/replaced. Aortic valve  regurgitation is not visualized. There is a 21 mm Magna Ease bioprosthetic  valve present in the aortic position. Aortic valve mean gradient measures  7.0 mmHg. Grossly normal function.   6. The inferior vena cava is normal in size with greater than 50%  respiratory variability, suggesting right atrial pressure of 3 mmHg.     07/2013 Cath Impression: 1. No angiographic evidence of CAD 2. Elevated filling pressures     01/2021 nuclear stress   Atrial fibrillation present throughout. No diagnostic ST segment changes to indicate ischemia. Small, mild to moderate intensity, mid to apical anterior defect. This is fixed with the exception of a small degree of partial reversibility in the mid zone. Suggestive of scar versus variable soft tissue attenuation. This is a low risk study. Nuclear stress EF: 50%.   01/2021 carotid US Summary:  Right Carotid: Velocities in the right ICA are consistent with a 1-39%  stenosis.   Left Carotid: Velocities in the left ICA are consistent with a 1-39%  stenosis.   Vertebrals:  Bilateral vertebral arteries demonstrate antegrade flow.  Subclavians: Normal flow  hemodynamics were seen in bilateral subclavian               arteries.    Assessment and Plan   1. Valvular heart disease   - recent echo with normal functioning AVR and MVR - continue to monitor  2. Chronic diastolic HF - recent admission with volume overload - doing well since discharge, though has some orthostatic dizziness - lower lasix from '60mg'$  bid to '60mg'$  in AM '40mg'$  in PM, can take additional '20mg'$  prn.      3. Afib    - hx of prior cardioembolic CVA, has been continued on coumadin.  -no symptoms, continue current meds   F/u 4 months    Arnoldo Lenis, M.D.,

## 2022-05-04 NOTE — Patient Instructions (Signed)
Medication Instructions:  Your physician has recommended you make the following change in your medication:  Take lasix 60 mg in the morning and 40 mg in the evening. May take additional 20 mg for leg swelling and sob Continue all other medications as directed  Labwork: none  Testing/Procedures: none  Follow-Up:  Your physician recommends that you schedule a follow-up appointment in: 4 months  Any Other Special Instructions Will Be Listed Below (If Applicable).  If you need a refill on your cardiac medications before your next appointment, please call your pharmacy.

## 2022-05-05 DIAGNOSIS — I5032 Chronic diastolic (congestive) heart failure: Secondary | ICD-10-CM | POA: Diagnosis not present

## 2022-05-16 ENCOUNTER — Ambulatory Visit (INDEPENDENT_AMBULATORY_CARE_PROVIDER_SITE_OTHER): Payer: Medicare PPO | Admitting: *Deleted

## 2022-05-16 ENCOUNTER — Other Ambulatory Visit: Payer: Self-pay | Admitting: Internal Medicine

## 2022-05-16 DIAGNOSIS — I639 Cerebral infarction, unspecified: Secondary | ICD-10-CM | POA: Diagnosis not present

## 2022-05-16 DIAGNOSIS — I4891 Unspecified atrial fibrillation: Secondary | ICD-10-CM | POA: Diagnosis not present

## 2022-05-16 DIAGNOSIS — Z952 Presence of prosthetic heart valve: Secondary | ICD-10-CM | POA: Diagnosis not present

## 2022-05-16 DIAGNOSIS — Z5181 Encounter for therapeutic drug level monitoring: Secondary | ICD-10-CM | POA: Diagnosis not present

## 2022-05-16 LAB — POCT INR: INR: 2.2 (ref 2.0–3.0)

## 2022-05-16 NOTE — Patient Instructions (Signed)
Take warfarin 2 tablets tonight then resume 1 tablet daily except for 1.5 tablets on Sundays and Thursdays. Recheck INR in 4 weeks.

## 2022-06-13 ENCOUNTER — Ambulatory Visit: Payer: Medicare PPO | Attending: Cardiology | Admitting: *Deleted

## 2022-06-13 ENCOUNTER — Other Ambulatory Visit: Payer: Self-pay | Admitting: Internal Medicine

## 2022-06-13 DIAGNOSIS — I4891 Unspecified atrial fibrillation: Secondary | ICD-10-CM

## 2022-06-13 DIAGNOSIS — I639 Cerebral infarction, unspecified: Secondary | ICD-10-CM | POA: Diagnosis not present

## 2022-06-13 DIAGNOSIS — Z5181 Encounter for therapeutic drug level monitoring: Secondary | ICD-10-CM

## 2022-06-13 DIAGNOSIS — R0609 Other forms of dyspnea: Secondary | ICD-10-CM

## 2022-06-13 LAB — POCT INR: INR: 3.1 — AB (ref 2.0–3.0)

## 2022-06-13 NOTE — Patient Instructions (Signed)
Continue warfarin 1 tablet daily except for 1.5 tablets on Sundays and Thursdays. Recheck INR in 4 weeks. 

## 2022-06-22 DIAGNOSIS — Z23 Encounter for immunization: Secondary | ICD-10-CM | POA: Diagnosis not present

## 2022-06-27 ENCOUNTER — Other Ambulatory Visit: Payer: Self-pay | Admitting: Internal Medicine

## 2022-07-05 DIAGNOSIS — Z79899 Other long term (current) drug therapy: Secondary | ICD-10-CM | POA: Diagnosis not present

## 2022-07-05 DIAGNOSIS — E785 Hyperlipidemia, unspecified: Secondary | ICD-10-CM | POA: Diagnosis not present

## 2022-07-05 DIAGNOSIS — E039 Hypothyroidism, unspecified: Secondary | ICD-10-CM | POA: Diagnosis not present

## 2022-07-05 DIAGNOSIS — E1129 Type 2 diabetes mellitus with other diabetic kidney complication: Secondary | ICD-10-CM | POA: Diagnosis not present

## 2022-07-05 DIAGNOSIS — I5032 Chronic diastolic (congestive) heart failure: Secondary | ICD-10-CM | POA: Diagnosis not present

## 2022-07-11 ENCOUNTER — Ambulatory Visit: Payer: Medicare PPO | Attending: Cardiology | Admitting: *Deleted

## 2022-07-11 DIAGNOSIS — Z5181 Encounter for therapeutic drug level monitoring: Secondary | ICD-10-CM | POA: Diagnosis not present

## 2022-07-11 DIAGNOSIS — Z952 Presence of prosthetic heart valve: Secondary | ICD-10-CM | POA: Diagnosis not present

## 2022-07-11 LAB — POCT INR: INR: 2.4 (ref 2.0–3.0)

## 2022-07-11 NOTE — Patient Instructions (Signed)
Take warfarin 1 1/2 tablets tonight then resume 1 tablet daily except for 1.5 tablets on Sundays and Thursdays. Recheck INR in 4 weeks.

## 2022-07-12 DIAGNOSIS — I5032 Chronic diastolic (congestive) heart failure: Secondary | ICD-10-CM | POA: Diagnosis not present

## 2022-07-12 DIAGNOSIS — I482 Chronic atrial fibrillation, unspecified: Secondary | ICD-10-CM | POA: Diagnosis not present

## 2022-07-12 DIAGNOSIS — E1122 Type 2 diabetes mellitus with diabetic chronic kidney disease: Secondary | ICD-10-CM | POA: Diagnosis not present

## 2022-07-12 DIAGNOSIS — R7309 Other abnormal glucose: Secondary | ICD-10-CM | POA: Diagnosis not present

## 2022-07-18 ENCOUNTER — Other Ambulatory Visit: Payer: Self-pay | Admitting: Cardiology

## 2022-07-18 ENCOUNTER — Other Ambulatory Visit: Payer: Self-pay | Admitting: Internal Medicine

## 2022-07-18 DIAGNOSIS — R0609 Other forms of dyspnea: Secondary | ICD-10-CM

## 2022-07-22 ENCOUNTER — Ambulatory Visit: Payer: Medicare PPO | Admitting: Cardiology

## 2022-07-25 ENCOUNTER — Ambulatory Visit (INDEPENDENT_AMBULATORY_CARE_PROVIDER_SITE_OTHER): Payer: Medicare PPO | Admitting: Internal Medicine

## 2022-07-25 ENCOUNTER — Encounter: Payer: Self-pay | Admitting: Internal Medicine

## 2022-07-25 DIAGNOSIS — R058 Other specified cough: Secondary | ICD-10-CM | POA: Diagnosis not present

## 2022-07-25 NOTE — Patient Instructions (Signed)
Cough/ congestion : mucinex dm up 1200 mg every 12 hours as needed  Please schedule a follow up visit in 6  months but call sooner if needed

## 2022-07-25 NOTE — Progress Notes (Signed)
[name], female    DOB: 1945-11-14      MRN: [id number]   Brief patient profile:  76   yowf never smoker with a h/o sob related to valvular heart dz    Seen prior to Epic in [place]: Feb 20, 2009 initial pulmonary eval started aerobics 3 months prior to OV  X 30 min weekly and tolerated well with improving ex tol until 3 weeks prior to OV   noted decrease tolerance and since then gradually worse to point where walking to mailbox slt uphill or climbing steps has to stop at top.  In retrospect coughing for 3 months dry, worse immediately on lying down. due to the cough lisnopril was stopped and could not tolerate the substitute and cough hasn't changed but breathing got worse. prednisone no help so far and proaire no benefit. rec GERD diet   continue prilosec and reglan stop prednisone and proaire  avoid salt  maxzide 25 one each am and Dr Izell  office   10/16/12 Meth challenge  + for hyperreactive airways disease  clance eval 05/14/13 [name] that at this point is clearly upper airway in origin.  [name], but her current cough failed to respond to prednisone tapers.  Therefore it is unlikely that it is related to cough variant asthma.  She does not have chronic sinusitis by her CT scan, and there is nothing on chest x-ray earlier in the year to explain her cough.  She does have a history of breast cancer, but there has been no evidence for recurrence.  [name], but this has an extremely low yield.  [name], and therefore I am concerned she may be having laryngopharyngeal reflux as a cause for her cough.  I also think she may have a cyclical mechanism as well she did have some response to tramadol, and we could consider a trial of gabapentin  AVR  07/18/13   04/2020 echo  1. Left ventricular ejection fraction, by estimation, is 60 to 65%. The   left ventricle has normal function. The left ventricle has no regional  wall motion abnormalities. Left ventricular diastolic parameters are  indeterminate.   2. Right ventricular systolic function is low normal. The right  ventricular size is normal. There is moderately elevated pulmonary artery  systolic pressure. The estimated right ventricular systolic pressure is  60.1 mmHg.   3. Left atrial size was mildly dilated.   4. The mitral valve has been repaired/replaced. No evidence of mitral  valve regurgitation. The mean mitral valve gradient is 5.0 mmHg. There is  a 25 mm Magna Ease bioprosthetic valve present in the mitral position.  Grossly normal function.   5. The aortic valve has been repaired/replaced. Aortic valve  regurgitation is not visualized. There is a 21 mm Magna Ease bioprosthetic  valve present in the aortic position. Aortic valve mean gradient measures  7.0 mmHg. Grossly normal function.   6. The inferior vena cava is normal in size with greater than 50%  respiratory variability, suggesting right atrial pressure of 3 mmHg.        History of Present Illness  06/23/2020  Pulmonary/ 1st office eval/ [name] / [place] [facility] / re establish re cough and sob on flovent 44 2bid  For cough variant asthma  Chief Complaint  Patient presents with   Consult    Patient  has a dry cough that has been going on for many years but worse over the last 6 months. Shortness of breath with exertion.   Dyspnea:  Walks dog x 30 min / mostly flat uses cane to balance/ steps ok slowly due to breathing problems x sev years prior to OV   Cough: worse in afternoons and sometimes hs has to sit up 30 degrees to prevent cough supine  Sleep: ok as long as head is up 30 degrees new x 2 years  SABA use: sev times a day seems to help at 4 pm rec GERD diet  Pantoprazole (protonix) 40 mg   Take  30-60 min before first meal of the day and Pepcid (famotidine)  20 mg one after supper until return to  office - this is the best way to tell whether stomach acid is contributing to your problem.   Plan A = Automatic = Always=    flovent Take 2 puffs first thing in am and then another 2 puffs about 12 hours later.  Work on inhaler technique:  Plan B = Backup (to supplement plan A, not to replace it) Only use your albuterol inhaler as a rescue medication   Please schedule a follow up office visit in 6 weeks, call sooner if needed - bring inhalers  Next step ? Gabapentin?   08/05/2020  f/u ov/[place] office/[name] re: cough > sob x years  Chief Complaint  Patient presents with   Follow-up    non productive cough   Dyspnea:  Not limited by breathing from desired activities  / plays golf/ walks dog  Cough: dry ? Worse with activity (husband says not reproducible ?  worse p supper  Sleeping: most nights waking up  SABA use: once a day  02: none  rec Depomedrol 120 mg IM  Plan A = Automatic = Always=    symbicort 80 Take 2 puffs first thing in am and then another 2 puffs about 12 hours later.  Work on inhaler technique:     Plan B = Backup (to supplement plan A, not to replace it) Only use your albuterol inhaler as a rescue medication   Please schedule a follow up office visit in 6 weeks, call sooner if needed  - bring inhalers and the empty cannister of symbicort    09/16/2020  f/u ov/[place] office/[name] re: cough  x years ? Decades "nothing has ever helped" no better p depo last ov, not ever transiently  Chief Complaint  Patient presents with   Follow-up    Cough is unchanged since the last visit- prod with white sputum.  She is using her albuterol inhaler once per day usually around 4-5 pm.   Dyspnea:  Walks dog, uses cane / some balance issues limiting more than sob and still plays golf Cough: worse  p supper despite pepcid Sleeping: does better in recliner 30 degrees  SABA use: once a day around 5pm seems to help though note hfa very poor baseline  02: none Work on inhaler  technique:   Gabapentin 100 mg twice a day for a week then 4 x daily  Please remember to go to the  x-ray department  @  [facility] for your tests - we will call you with the results when they are available    Add Needs f/u cxr  ? rml syndrome p rx mucinex dm > then ct chest/ sinus if not better  and check allergy profile on return  10/29/2020  f/u ov/[place] office/Johntavius Shepard re: chronic cough  Chief Complaint  Patient presents with   Followup     Cough is unchanged since the last visit. She has used her albuterol inhaler twice since her last visit.   Dyspnea:  Slowed more balance than breathing, walks with suction cup cane Cough: dry cough day > noct Sleeping: able to sleep sitting up 30 degrees s sob or cough  SABA use: rare 02: none  Rec We will call to schedule you CT sinus (neg) and chest = ? Scarring from Breast RT (note surgery/RT in early 90's and cough started p that      07/22/2021  f/u ov/Seve Monette re: cough > sob    maint on flovent  for dx of MCT pos cough variant asthma / stopped gabapentin ? Why ? Chief Complaint  Patient presents with   Follow-up    Productive cough with thick white mucus   Dyspnea:  mailbox and back is 200 yards slow pace Cough: daytime cough / lots of throat clearing  Sleeping: 30 degrees ok  SABA use: using saba bid/ poor flovent use  02: none  Covid status:   vax x 5  Rec Pantoprazole (protonix) 40 mg   Take  30-60 min before first meal of the day and Pepcid (famotidine)  20 mg after supper until return to office   Gabapentin 100 mg four times daily  Plan A = Automatic = Always=   Symbicort 80 mg  Take 2 puffs first thing in am and then another 2 puffs about 12 hours later. (Stop flovent and atrovent- unless can't afford symbicort on your plan) Plan B = Backup (to supplement plan A, not to replace it) Only use your albuterol inhaler as a rescue medication   Plan C = Crisis (instead of Plan B but only if Plan B stops working) - only  use your albuterol nebulizer if you first try Plan B   For cough > mucinex dm 1200 every 12 hours as needed      01/21/2022  f/u ov/[place] office/Kamila Broda re: uacs vs cough variant maint on alb/bud 0.25 mg bid   Chief Complaint  Patient presents with   Follow-up    Cough sob same since last ov    Dyspnea:  still walking dog and does better  if times it after am  albuterol  Cough: dry worse with allergies rx zyrtec works [employer]: 30 degrees hob  SABA use: neb twice daily with budesonide, no need for prn 02: none  Rec No change in medications  When symptoms flare go ahead and take the whole tube of budesonide = 0.5 mg until better then back to a half a tube   Admit date: 03/31/2022 Discharge date: 04/03/2022  Brief/Interim Summary: [name] is a 76 y.o. female with medical history significant for COPD and asthma, atrial fibrillation, stroke, aortic and mitral valve replacement, on chronic anticoagulation, diabetes mellitus. Patient presented to the ED with reports of difficulty breathing of 2 days duration.  She was admitted with acute hypoxemic respiratory failure in the setting of volume overload.  Pneumonia was initially suspected, but was ruled out and antibiotics were discontinued.  2D echocardiogram with preserved LVEF and suspected diastolic dysfunction noted.  She has diuresed quite well and is under her baseline weight of 173 pounds to 165 pounds at this time.  She has diuresed nearly 2 L of fluid and has no further oxygen requirements or significant edema. REDS Clip  reading 36%.  She is eager to go home and will have her Lasix dose increased to 40 mg daily as opposed to as needed dosing previously.  She continues to have mild hypokalemia for which her potassium supplementation has been increased as well.  No other acute events noted during the course of the stay.   Discharge Diagnoses:  Principal Problem:   Acute respiratory failure with hypoxia (HCC)   Acute  congestive heart failure (HCC)   Pneumonia   Essential hypertension   Chronic anticoagulation   Cardioembolic stroke [facility])   Atrial fibrillation (HCC)   Status post aortic valve and mitral valve replacement   Type 2 diabetes mellitus treated without insulin ([place])   Principal discharge diagnosis: Acute hypoxemic respiratory failure likely secondary to volume overload with acute on chronic diastolic CHF exacerbation.  04/20/2022  f/u ov/[place] office/Trinten Boudoin re: cough x 2010  maint on gerd rx/ alb neb bid with bud 0.5 mg bid   Chief Complaint  Patient presents with   Follow-up    SOB has not improved. Cough is getting better. Patient had a hospital admission 6/29-7/2 at [place]   Dyspnea:  only walking dog x 15 min then quits due to sob  Cough: better now  Sleeping: 30 degrees hob  SABA use: bid neb only / usually twice daily   02: none  Rec No change rx     07/25/2022  f/u ov/[place] office/Gerell Fortson re: cough maint on alb/bd bid  / worse with dry cough  Chief Complaint  Patient presents with   Follow-up    Cough was improving until pt got sick a few weeks ago   Dyspnea:  no change  Cough: dry  - still  thick white p uri / husband had same illness 1st of Oct 2023 Sleeping: 30 degrees HOB  SABA use: hasn't needed saba  02: none     No obvious day to day or daytime variability or assoc excess/ purulent sputum or mucus plugs or hemoptysis or cp or chest tightness, subjective wheeze or overt sinus or hb symptoms.   Sleeping  without nocturnal  or early am exacerbation  of respiratory  c/o's or need for noct saba. Also denies any obvious fluctuation of symptoms with weather or environmental changes or other aggravating or alleviating factors except as outlined above   No unusual exposure hx or h/o childhood pna/ asthma or knowledge of premature birth.  Current Allergies, Complete Past Medical History, Past Surgical History, Family History, and Social History were reviewed in  [employer] record.  ROS  The following are not active complaints unless bolded Hoarseness, sore throat, dysphagia, dental problems, itching, sneezing,  nasal congestion or discharge of excess mucus or purulent secretions, ear ache,   fever, chills, sweats, unintended wt loss or wt gain, classically pleuritic or exertional cp,  orthopnea pnd or arm/hand swelling  or leg swelling, presyncope, palpitations, abdominal pain, anorexia, nausea, vomiting, diarrhea  or change in bowel habits or change in bladder habits, change in stools or change in urine, dysuria, hematuria,  rash, arthralgias, visual complaints, headache, numbness, weakness or ataxia or problems with walking or coordination,  change in mood or  memory.        Current Meds  Medication Sig   albuterol (PROVENTIL) (2.5 MG/3ML) 0.083% nebulizer solution Take 3 mLs (2.5 mg total) by nebulization every 4 (four) hours as needed for wheezing or shortness of breath. (Patient taking differently: Take 2.5 mg by nebulization 2 (two)  times daily as needed for wheezing or shortness of breath.)   atorvastatin (LIPITOR) 40 MG tablet TAKE ONE TABLET BY MOUTH ONCE DAILY.   budesonide (PULMICORT) 0.25 MG/2ML nebulizer solution One 0.25 mg  twice daily  along with albuterol   cetirizine (ZYRTEC) 10 MG tablet Take 10 mg by mouth daily.   chlorhexidine (PERIDEX) 0.12 % solution Use as directed 15 mLs in the mouth or throat 2 (two) times daily.   dextromethorphan-guaiFENesin (MUCINEX DM) 30-600 MG 12hr tablet Take 1 tablet by mouth 2 (two) times daily.   diltiazem (CARDIZEM CD) 180 MG 24 hr capsule Take 1 capsule (180 mg total) by mouth daily.   escitalopram (LEXAPRO) 10 MG tablet Take 10 mg by mouth daily.    famotidine (PEPCID) 20 MG tablet TAKE 1 TABLET AFTER SUPPER   fluticasone (FLONASE) 50 MCG/ACT nasal spray Place 1 spray into both nostrils daily as needed for allergies.    furosemide (LASIX) 40 MG tablet Take 1.5 tablets (60 mg  total) by mouth in the morning AND 1 tablet (40 mg total) every evening. May take additional 20 mg as needed for leg swelling and sob. (Patient taking differently: Take 1.5 tablets (60 mg total) by mouth in the morning AND 1 tablet (40 mg total) every evening. May take additional 20 mg as needed for leg swelling and sob. 120 mg daily per patient )   gabapentin (NEURONTIN) 100 MG capsule TAKE 1 CAPSULE BY MOUTH FOUR TIMES DAILY.   levothyroxine (SYNTHROID) 100 MCG tablet Take 100 mcg by mouth daily before breakfast.   metFORMIN (GLUCOPHAGE) 500 MG tablet Take 1 tablet (500 mg total) by mouth 2 (two) times daily with a meal.   metoprolol tartrate (LOPRESSOR) 25 MG tablet Take 1.5 tablets (37.5 mg total) by mouth 2 (two) times daily.   Multiple Vitamin (MULITIVITAMIN WITH MINERALS) TABS Take 1 tablet by mouth daily.   olmesartan (BENICAR) 5 MG tablet Take 5 mg by mouth daily.   pantoprazole (PROTONIX) 40 MG tablet TAKE 1 TABLET 30 TO 60 MINUTES BEFORE FIRST MEAL OF THE DAY.   potassium chloride SA (KLOR-CON M) 20 MEQ tablet Take 1 tablet (20 mEq total) by mouth 2 (two) times daily.   PREVIDENT 5000 BOOSTER PLUS 1.1 % PSTE Place 1 application  onto teeth daily.   warfarin (COUMADIN) 2.5 MG tablet TAKE AS DIRECTED BY COUMADIN CLINIC. (Patient taking differently: Take 2.5 mg by mouth See admin instructions. Take 2.5 daily and on Sunday and Thursday add additional 1/2 tablet)                   Past Medical History:  Diagnosis Date   Anemia    Atrial fibrillation ([place])    Breast carcinoma ([place]) 1995   7408   Cardioembolic stroke ([place]) 10/4479   Right frontal in 01/2012; normal carotid ultrasound; possible LAA thrombus by TEE; virtual complete neurologic recovery   Chronic kidney disease, stage 2, mildly decreased GFR    GFR of approximately 60   Depression    Diverticulosis of colon (without mention of hemorrhage) 2012   Dr. Laural Golden   Fasting hyperglycemia    120 fasting   Gastroesophageal  reflux disease    Gastroparesis    Hemorrhoids    Hyperlipidemia    Hypertension    pt denies 05/30/13     Dr Kathe Mariner   Hyponatremia    Rheumatic heart disease    a. 07/2013 Echo: Ef 55-60%, Mod AS/AI, Mod-Sev MS, sev  dil LA, PASP 58;  b. 07/2013 TEE: EF 35-40%, mild-mod AS/AI, mod-sev MS, LA smoke;  c. 07/2013 Cath: elev R heart pressures, nl cors.   Shortness of breath     Past Medical History:    Hx of NEOPLASM, MALIGNANT, BREAST (ICD-174.9)    HYPERLIPIDEMIA (ICD-272.4)    ASTHMA (ICD-493.90)    GERD with gastroparesis   Past Surgical History:    Breast surgery 1995    Heart valve surgery 1962 age 4 [facility] Mitral valve     Tubal ligation-1973   Objective:     Wt  07/25/2022      167 04/20/2022        170  01/21/2022        177  12/13/2021        177 09/14/2021      170  07/22/2021      162  10/29/2020        177 09/16/2020      178   08/05/20 178 lb 12.8 oz (81.1 kg)  06/25/20 194 lb (88 kg)  06/23/20 180 lb (81.6 kg)    Vital signs reviewed  07/25/2022  - Note at rest 02 sats  93% on RA    General appearance:    amb somber wf  nad    HEENT : Oropharynx  clear      Nasal turbinates nl    NECK :  without  apparent JVD/ palpable Nodes/TM    LUNGS: no acc muscle use,  Nl contour chest which is clear to A and P bilaterally without cough on insp or exp maneuvers   CV:  very regular rhythm  no s3   2/6 SEM increase in P2, and no edema   ABD:  soft and nontender with nl inspiratory excursion in the supine position. No bruits or organomegaly appreciated   MS:  Nl gait/ ext warm without deformities Or obvious joint restrictions  calf tenderness, cyanosis or clubbing    SKIN: warm and dry without lesions    NEURO:  alert, approp, nl sensorium with  no motor or cerebellar deficits apparent.            Assessment

## 2022-07-26 ENCOUNTER — Encounter: Payer: Self-pay | Admitting: Internal Medicine

## 2022-07-26 NOTE — Assessment & Plan Note (Signed)
Original onset 2010 on ACEi  Spiro 09/2012: normal  Sinus ct 2014:  No chronic sinus disease. 10/16/12 Meth challenge  + for hyperreactive airways disease 04/30/20 CT head sinus nl - 06/23/2020  After extensive coaching inhaler device,  effectiveness =    75% from a baseline of 25% > continue flovent 44 2bid and prn saba plus max gerd rx  08/05/2020    changed to symb 80 2bid and given depomedrol 120 mg IM  > no better - 09/16/2020  After extensive coaching inhaler device,  effectiveness =    50% baseline of less than 25% - 09/16/2020 started gabapentin titrate up to 100 mg qid  -  11/26/20  Sinus CT neg  - Allergy profile 07/22/2021 >  Eos 0.2 /  IgE  15 - 09/14/2021  After extensive coaching inhaler device,  effectiveness =    0% so changed to alb/bud bid > 12/13/2021 did not follow instructions so try again same rx  With  Prednisone 10 mg take  4 each am x 2 days,   2 each am x 2 days,  1 each am x 2 days and stop and bud 0.25 mg bid combined with albuterol since can't afford performist on her plan.  Much improved on above rx with main c/o = post viral mucociliary dysfunction s evidence of significant bronchitis so rx mucinex dm and f/u q 6 m sooner prn          Each maintenance medication was reviewed in detail including emphasizing most importantly the difference between maintenance and prns and under what circumstances the prns are to be triggered using an action plan format where appropriate.  Total time for H and P, chart review, counseling, reviewing neb device(s) and generating customized AVS unique to this office visit / same day charting = 24 min

## 2022-08-08 ENCOUNTER — Other Ambulatory Visit: Payer: Self-pay | Admitting: Cardiology

## 2022-08-08 ENCOUNTER — Other Ambulatory Visit: Payer: Self-pay | Admitting: Internal Medicine

## 2022-08-08 ENCOUNTER — Ambulatory Visit: Payer: Medicare PPO | Attending: Cardiology | Admitting: *Deleted

## 2022-08-08 DIAGNOSIS — Z5181 Encounter for therapeutic drug level monitoring: Secondary | ICD-10-CM

## 2022-08-08 DIAGNOSIS — Z952 Presence of prosthetic heart valve: Secondary | ICD-10-CM

## 2022-08-08 LAB — POCT INR: INR: 2.9 (ref 2.0–3.0)

## 2022-08-08 NOTE — Patient Instructions (Signed)
Continue warfarin 1 tablet daily except for 1.5 tablets on Sundays and Thursdays. Recheck INR in 4 weeks. 

## 2022-09-02 ENCOUNTER — Encounter: Payer: Self-pay | Admitting: Cardiology

## 2022-09-02 ENCOUNTER — Ambulatory Visit: Payer: Medicare PPO | Attending: Cardiology | Admitting: Cardiology

## 2022-09-02 VITALS — BP 120/64 | HR 53 | Ht 62.5 in | Wt 168.4 lb

## 2022-09-02 DIAGNOSIS — I10 Essential (primary) hypertension: Secondary | ICD-10-CM

## 2022-09-02 DIAGNOSIS — I5032 Chronic diastolic (congestive) heart failure: Secondary | ICD-10-CM | POA: Diagnosis not present

## 2022-09-02 DIAGNOSIS — I38 Endocarditis, valve unspecified: Secondary | ICD-10-CM

## 2022-09-02 DIAGNOSIS — I4891 Unspecified atrial fibrillation: Secondary | ICD-10-CM

## 2022-09-02 DIAGNOSIS — Z79899 Other long term (current) drug therapy: Secondary | ICD-10-CM | POA: Diagnosis not present

## 2022-09-02 MED ORDER — TORSEMIDE 20 MG PO TABS: 40.0000 mg | ORAL_TABLET | Freq: Two times a day (BID) | ORAL | 6 refills | Status: DC

## 2022-09-02 NOTE — Progress Notes (Signed)
Clinical Summary [name] is a 76 y.o.female seen today for the following medical problems.      1. Valvular heart disease   - 07/18/13 patient underwent MVR with 25 mm Edwards Magna-ease pericardial valve and also AVR with 21 mm Edwards Magna-ease pericardial valve    -03/2022 echo: LVEF 16%, indet diastolic, normal MVR and AVR.  - some low oxygen levels at home to 84%. PCP increased lasix, O2 sats improved.No edema, no weight gain. Chronic cough.     - taking lasix '80mg'$  bid - pcp did not blood in October - 07/05/2022 Cr 1.12 K 4 - home weights 161-164 lbs.      2.SOB -admitted 03/2022 with SOB, hypoxia - from notes O2 sats down to 89% on room air.  She reports dyspnea and cough.  Portable chest x-ray suggesting right base atelectasis or developing infection, also loculated pleural fluid in right lateral lung slightly increased from prior.  Also possible component of congestive heart failure. - 03/2022 echo: LVEF 10%, indet diastolic, normal MVR and AVR.  - BNP 297    - intermittent SOB at home, sats will get down to mid 80s - reports takes additional lasix and symptoms and O2 sats improve       3. HTN - compliant with meds       4. Afib   - hx of cardioembolic CVA, has been on longterm anticoag -- no recent palpitations - no bleeding on coumadin    - has had some palpitations, 1-2 times per day. Lasts just a few seconds    4. Hyperlipidemia - recent labs with pcp, compliant with stin   06/2021 TC 185 TG 75 HDL 66 LDL 104 01/2022 TC 177 TG 133 HDL 58 LDL 96   5. Carotid stenosis - 07/2013 [place] with mild bilateral stenosis 2018 mild disease - 01/2021 [place] mild bilateral disease                   SH: daughter with Crohns with disease, they spend a lot of time supporting her   Past Medical History:  Diagnosis Date   Anemia    Asthma    Atrial fibrillation ([place])    Breast carcinoma ([place]) 1995   9604   Cardioembolic stroke ([place]) 01/4097   Right  frontal in 01/2012; normal carotid ultrasound; possible LAA thrombus by TEE; virtual complete neurologic recovery   Chronic kidney disease, stage 2, mildly decreased GFR    GFR of approximately 60   Depression    Diabetes mellitus without complication ([place])    Diverticulosis of colon (without mention of hemorrhage) 2012   Dr. Laural Golden   Fasting hyperglycemia    120 fasting   Gastroesophageal reflux disease    Gastroparesis    Hemorrhoids    Hyperlipidemia    Hypertension    pt denies 05/30/13     Dr Kathe Mariner   Hyponatremia    Rheumatic heart disease    a. 07/2013 Echo: Ef 55-60%, Mod AS/AI, Mod-Sev MS, sev dil LA, PASP 58;  b. 07/2013 TEE: EF 35-40%, mild-mod AS/AI, mod-sev MS, LA smoke;  c. 07/2013 Cath: elev R heart pressures, nl cors.   Shortness of breath      Allergies  Allergen Reactions   Amiodarone Rash     Current Outpatient Medications  Medication Sig Dispense Refill   albuterol (PROVENTIL) (2.5 MG/3ML) 0.083% nebulizer solution Take 3 mLs (2.5 mg total) by nebulization every 4 (four) hours  as needed for wheezing or shortness of breath. (Patient taking differently: Take 2.5 mg by nebulization 2 (two) times daily as needed for wheezing or shortness of breath.) 75 mL 12   atorvastatin (LIPITOR) 40 MG tablet TAKE ONE TABLET BY MOUTH ONCE DAILY. 90 tablet 3   budesonide (PULMICORT) 0.25 MG/2ML nebulizer solution One 0.25 mg  twice daily  along with albuterol 120 mL 11   cetirizine (ZYRTEC) 10 MG tablet Take 10 mg by mouth daily.     chlorhexidine (PERIDEX) 0.12 % solution Use as directed 15 mLs in the mouth or throat 2 (two) times daily.     dextromethorphan-guaiFENesin (MUCINEX DM) 30-600 MG 12hr tablet Take 1 tablet by mouth 2 (two) times daily.     diltiazem (CARDIZEM CD) 180 MG 24 hr capsule Take 1 capsule (180 mg total) by mouth daily. 30 capsule 1   escitalopram (LEXAPRO) 10 MG tablet Take 10 mg by mouth daily.      famotidine (PEPCID) 20 MG tablet TAKE 1  TABLET AFTER SUPPER 30 tablet 0   fluticasone (FLONASE) 50 MCG/ACT nasal spray Place 1 spray into both nostrils daily as needed for allergies.      furosemide (LASIX) 40 MG tablet TAKE 1 & 1/2 TABLET IN THE MORNING AND 1 TABLET IN THE EVENING. MAY TAKE AN ADDITIONAL HALF TABLET AS NEEDED FOR LEG SWELLING AND SHORTNESS OF BREATH. 225 tablet 1   gabapentin (NEURONTIN) 100 MG capsule TAKE 1 CAPSULE BY MOUTH FOUR TIMES DAILY. 120 capsule 3   levothyroxine (SYNTHROID) 100 MCG tablet Take 100 mcg by mouth daily before breakfast.     metFORMIN (GLUCOPHAGE) 500 MG tablet Take 1 tablet (500 mg total) by mouth 2 (two) times daily with a meal. 60 tablet 1   metoprolol tartrate (LOPRESSOR) 25 MG tablet Take 1.5 tablets (37.5 mg total) by mouth 2 (two) times daily. 270 tablet 1   Multiple Vitamin (MULITIVITAMIN WITH MINERALS) TABS Take 1 tablet by mouth daily.     olmesartan (BENICAR) 5 MG tablet Take 5 mg by mouth daily.     pantoprazole (PROTONIX) 40 MG tablet TAKE 1 TABLET 30 TO 60 MINUTES BEFORE FIRST MEAL OF THE DAY. 30 tablet 11   potassium chloride SA (KLOR-CON M) 20 MEQ tablet Take 1 tablet (20 mEq total) by mouth 2 (two) times daily. 60 tablet 0   PREVIDENT 5000 BOOSTER PLUS 1.1 % PSTE Place 1 application  onto teeth daily.     warfarin (COUMADIN) 2.5 MG tablet TAKE AS DIRECTED BY COUMADIN CLINIC. (Patient taking differently: Take 2.5 mg by mouth See admin instructions. Take 2.5 daily and on Sunday and Thursday add additional 1/2 tablet) 45 tablet 5   No current facility-administered medications for this visit.     Past Surgical History:  Procedure Laterality Date   AORTIC VALVE REPLACEMENT N/A 07/18/2013   Procedure: AORTIC VALVE REPLACEMENT (AVR);  Surgeon: Gaye Pollack, MD;  Location: [facility];  Service: Open Heart Surgery;  Laterality: N/A;   BACK SURGERY     BREAST LUMPECTOMY Right 1995   CARDIAC CATHETERIZATION     CARDIOVERSION N/A 07/26/2013   Procedure: CARDIOVERSION;  Surgeon: Fay Records, MD;  Location: [facility];  Service: Cardiovascular;  Laterality: N/A;   COLONOSCOPY  2012   Negative screening procedure   DILATION AND CURETTAGE OF UTERUS     ESOPHAGEAL MANOMETRY N/A 06/17/2013   Procedure: ESOPHAGEAL MANOMETRY (EM);  Surgeon: Sable Feil, MD;  Location: [facility];  Service:  Endoscopy;  Laterality: N/A;   INTRAOPERATIVE TRANSESOPHAGEAL ECHOCARDIOGRAM N/A 07/18/2013   Procedure: INTRAOPERATIVE TRANSESOPHAGEAL ECHOCARDIOGRAM;  Surgeon: Gaye Pollack, MD;  Location: [facility];  Service: Open Heart Surgery;  Laterality: N/A;   LEFT AND RIGHT HEART CATHETERIZATION WITH CORONARY ANGIOGRAM N/A 07/10/2013   Procedure: LEFT AND RIGHT HEART CATHETERIZATION WITH CORONARY ANGIOGRAM;  Surgeon: Burnell Blanks, MD;  Location[facility] C[facility] LAB;  Service: Cardiovascular;  Laterality: N/A;   LUMBAR LAMINECTOMY/DECOMPRESSION MICRODISCECTOMY Right 05/13/2014   Procedure: LUMBAR LAMINECTOMY/DECOMPRESSION MICRODISCECTOMY 1 LEVEL  lumbar four/five;  Surgeon: Faythe Ghee, MD;  Location: [facility]O [facility];  Service: Neurosurgery;  Laterality: Right;   MITRAL VALVE REPLACEMENT N/A 07/18/2013   Procedure: MITRAL VALVE (MV) REPLACEMENT;  Surgeon: Gaye Pollack, MD;  Location: [facility];  Service: Open Heart Surgery;  Laterality: N/A;   MITRAL VALVE [place], closed mitral valvulotomy by finger fracture   TEE WITHOUT CARDIOVERSION  01/24/2012   Procedure: TRANSESOPHAGEAL ECHOCARDIOGRAM (TEE);  Surgeon: Lelon Perla, MD;  Location: [facility] ENDOSCOPY;  Service: Cardiovascular;  Laterality: N/A;   TEE WITHOUT CARDIOVERSION N/A 07/11/2013   Procedure: TRANSESOPHAGEAL ECHOCARDIOGRAM (TEE);  Surgeon: Thayer Headings, MD;  Location: [facility];  Service: Cardiovascular;  Laterality: N/A;   TEE WITHOUT CARDIOVERSION N/A 07/26/2013   Procedure: TRANSESOPHAGEAL ECHOCARDIOGRAM (TEE);  Surgeon: Fay Records, MD;  Location: [facility] ENDOSCOPY;  Service: Cardiovascular;  Laterality: N/A;   TUBAL  LIGATION  1973     Allergies  Allergen Reactions   Amiodarone Rash      Family History  Problem Relation Age of Onset   Heart disease Brother 17       MI   Rheum arthritis Maternal Grandmother    Asthma Maternal Grandfather    Hypothyroidism Mother    Diabetes Sister    Cirrhosis Father      Social History [name] reports that she has never smoked. She has never used smokeless tobacco. [name] reports no history of alcohol use.   Review of Systems CONSTITUTIONAL: No weight loss, fever, chills, weakness or fatigue.  HEENT: Eyes: No visual loss, blurred vision, double vision or yellow sclerae.No hearing loss, sneezing, congestion, runny nose or sore throat.  SKIN: No rash or itching.  CARDIOVASCULAR: per hpi RESPIRATORY: per hpi GASTROINTESTINAL: No anorexia, nausea, vomiting or diarrhea. No abdominal pain or blood.  GENITOURINARY: No burning on urination, no polyuria NEUROLOGICAL: No headache, dizziness, syncope, paralysis, ataxia, numbness or tingling in the extremities. No change in bowel or bladder control.  MUSCULOSKELETAL: No muscle, back pain, joint pain or stiffness.  LYMPHATICS: No enlarged nodes. No history of splenectomy.  PSYCHIATRIC: No history of depression or anxiety.  ENDOCRINOLOGIC: No reports of sweating, cold or heat intolerance. No polyuria or polydipsia.  Marland Kitchen   Physical Examination Today's Vitals   09/02/22 1050  BP: 120/64  Pulse: (!) 53  SpO2: 92%  Weight: 168 lb 6.4 oz (76.4 kg)  Height: 5' 2.5" (1.588 m)   Body mass index is 30.31 kg/m.  Gen: resting comfortably, no acute distress HEENT: no scleral icterus, pupils equal round and reactive, no palptable cervical adenopathy,  CV: irreg, 2/6 systolic murmur rusb, no jvd Resp:mild crackles bilaterally GI: abdomen is soft, non-tender, non-distended, normal bowel sounds, no hepatosplenomegaly MSK: extremities are warm, 1+ bilateral LE edema Skin: warm, no rash Neuro:  no focal  deficits Psych: appropriate affect   Diagnostic Studies  07/2013 TTE Study Conclusions  - Study data: Technically difficult study. -  Left ventricle: The cavity size was normal. Wall thickness   was normal. Systolic function was normal. The estimated   ejection fraction was in the range of 55% to 60%.   Indeterminate diastolic function. There is evidence of   very high left atrial pressures (E/e' 63) - Aortic valve: Trileaflet; mildly thickened leaflets.   Accurate evaluation of aortic stenosis limited in the   setting of severe mitral stenosis and decreased   ventricular loadin. Morphologically there does not appear   to be significant stenosis. By gradient (mean PG 21 mmHg)   the stenosis is moderate, by AVA VTI (0.8) stenosis is   severe. Moderate regurgitation, AR PHT is 439 cm/s. Valve   area: 0.78cm^2(VTI). Valve area: 0.78cm^2 (Vmax). - Mitral valve: The findings are consistent with moderate to   severe stenosis. (Mean PG 12 mmHg, MVA by PHT 1.3. Cannot   use MVA by VTI due to moderate AI. Valve area by pressure   half-time: 1.38cm^2. - Left atrium: The atrium was severely dilated. - Right ventricle: Systolic function was mildly reduced, RV   TAPSE is 1.4, TV systolic annular tissue velocity 10 cm/s. - Pulmonary arteries: Systolic pressure was moderately   increased. PA peak pressure: 74m Hg (S) (PASP based on a   fairly faint spectral Doppler waveform)     07/2013 TEE Study Conclusions  - Left ventricle: Systolic function was moderately reduced.   The estimated ejection fraction was in the range of 35% to   40%. - Aortic valve: There was mild to moderate stenosis. Mild to   moderate regurgitation. - Mitral valve: The findings are consistent with moderate to   severe stenosis. Valve area by pressure half-time:   1.45cm^2. - Left atrium: There was spontaneous echo contrast   ("smoke"). - Atrial septum: No defect or patent foramen ovale was   identified. -  Tricuspid valve: No evidence of vegetation. Transesophageal echocardiography.  2D and color Doppler. Height:  Height: 157.5cm. Height: 62in.  Weight:  Weight: 75kg. Weight: 165lb.  Body mass index:  BMI: 30.2kg/m^2. Body surface area:    BSA: 1.834m.  Blood pressure: 143/56.  Patient status:  Inpatient.  Location:  Endoscopy.      06/2018 echo Study Conclusions   - Left ventricle: The cavity size was normal. Wall thickness was   normal. Systolic function was normal. The estimated ejection   fraction was in the range of 60% to 65%. - Aortic valve: AV prosthesis is difficult to see Peak and mean   gradients through the valve are 18 and 10 mm Hg respectively. - Mitral valve: Peak and mean gradients through the valve   prosthesis are 19 and 4 mm Hg respectively MVA by P T1/2 is 3   cm2. s/p MV prosthesis. Valve prosthesis is difficult to see. - Pulmonary arteries: PA peak pressure: 48 mm Hg (S).     04/2020 echo IMPRESSIONS     1. Left ventricular ejection fraction, by estimation, is 60 to 65%. The  left ventricle has normal function. The left ventricle has no regional  wall motion abnormalities. Left ventricular diastolic parameters are  indeterminate.   2. Right ventricular systolic function is low normal. The right  ventricular size is normal. There is moderately elevated pulmonary artery  systolic pressure. The estimated right ventricular systolic pressure is  4566.0mHg.   3. Left atrial size was mildly dilated.   4. The mitral valve has been repaired/replaced. No evidence of mitral  valve regurgitation. The mean mitral  valve gradient is 5.0 mmHg. There is  a 25 mm Magna Ease bioprosthetic valve present in the mitral position.  Grossly normal function.   5. The aortic valve has been repaired/replaced. Aortic valve  regurgitation is not visualized. There is a 21 mm Magna Ease bioprosthetic  valve present in the aortic position. Aortic valve mean gradient measures  7.0 mmHg.  Grossly normal function.   6. The inferior vena cava is normal in size with greater than 50%  respiratory variability, suggesting right atrial pressure of 3 mmHg.     07/2013 Cath Impression: 1. No angiographic evidence of CAD 2. Elevated filling pressures     01/2021 nuclear stress   Atrial fibrillation present throughout. No diagnostic ST segment changes to indicate ischemia. Small, mild to moderate intensity, mid to apical anterior defect. This is fixed with the exception of a small degree of partial reversibility in the mid zone. Suggestive of scar versus variable soft tissue attenuation. This is a low risk study. Nuclear stress EF: 50%.   01/2021 carotid US Summary:  Right Carotid: Velocities in the right ICA are consistent with a 1-39%  stenosis.   Left Carotid: Velocities in the left ICA are consistent with a 1-39%  stenosis.   Vertebrals:  Bilateral vertebral arteries demonstrate antegrade flow.  Subclavians: Normal flow hemodynamics were seen in bilateral subclavian               arteries.    Assessment and Plan  1. Valvular heart disease   - recent echo with normal functioning AVR and MVR - we will continue to monitor   2. Chronic diastolic HF - ongoing issues with intermittent fluid overload, hypoxia at home - d/c lasix, change to torsemide '40mg'$  bid. 1 week check bmet/mg/bnp. If symptosm don't improve would repeat CXR, had small pleural effusion on 03/2022 study     3. Afib    - hx of prior cardioembolic CVA, has been continued on coumadin.  -short mild palpitations at times, conitnue current meds at this time.       Arnoldo Lenis, M.D.

## 2022-09-02 NOTE — Patient Instructions (Signed)
Medication Instructions:  Stop Lasix (Furosemide) Begin Torsemide '40mg'$  twice a day   Continue all other medications.     Labwork: BMET, BNP, MG - orders given today Please do in 1 week (around 09/09/2022) Office will contact with results via phone, letter or mychart.     Testing/Procedures: none  Follow-Up: 1 month   Any Other Special Instructions Will Be Listed Below (If Applicable).   If you need a refill on your cardiac medications before your next appointment, please call your pharmacy.

## 2022-09-05 ENCOUNTER — Ambulatory Visit: Payer: Medicare PPO | Attending: Cardiology | Admitting: *Deleted

## 2022-09-05 DIAGNOSIS — Z5181 Encounter for therapeutic drug level monitoring: Secondary | ICD-10-CM

## 2022-09-05 DIAGNOSIS — Z952 Presence of prosthetic heart valve: Secondary | ICD-10-CM | POA: Diagnosis not present

## 2022-09-05 LAB — POCT INR: INR: 2.6 (ref 2.0–3.0)

## 2022-09-05 NOTE — Patient Instructions (Signed)
Continue warfarin 1 tablet daily except for 1.5 tablets on Sundays and Thursdays. Recheck INR in 5 weeks.

## 2022-09-09 ENCOUNTER — Ambulatory Visit: Payer: Medicare PPO | Admitting: Cardiology

## 2022-09-09 DIAGNOSIS — I5032 Chronic diastolic (congestive) heart failure: Secondary | ICD-10-CM | POA: Diagnosis not present

## 2022-09-09 DIAGNOSIS — I10 Essential (primary) hypertension: Secondary | ICD-10-CM | POA: Diagnosis not present

## 2022-09-09 DIAGNOSIS — Z79899 Other long term (current) drug therapy: Secondary | ICD-10-CM | POA: Diagnosis not present

## 2022-09-10 LAB — MAGNESIUM: Magnesium: 1.7 mg/dL (ref 1.6–2.3)

## 2022-09-10 LAB — BASIC METABOLIC PANEL
BUN/Creatinine Ratio: 10 — ABNORMAL LOW (ref 12–28)
BUN: 12 mg/dL (ref 8–27)
CO2: 25 mmol/L (ref 20–29)
Calcium: 9.5 mg/dL (ref 8.7–10.3)
Chloride: 98 mmol/L (ref 96–106)
Creatinine, Ser: 1.18 mg/dL — ABNORMAL HIGH (ref 0.57–1.00)
Glucose: 119 mg/dL — ABNORMAL HIGH (ref 70–99)
Potassium: 3.9 mmol/L (ref 3.5–5.2)
Sodium: 140 mmol/L (ref 134–144)
eGFR: 47 mL/min/{1.73_m2} — ABNORMAL LOW (ref >59)

## 2022-09-10 LAB — BRAIN NATRIURETIC PEPTIDE: BNP: 118.7 pg/mL — ABNORMAL HIGH (ref 0.0–100.0)

## 2022-10-10 ENCOUNTER — Ambulatory Visit (INDEPENDENT_AMBULATORY_CARE_PROVIDER_SITE_OTHER): Payer: Medicare PPO | Admitting: *Deleted

## 2022-10-10 ENCOUNTER — Ambulatory Visit
Admission: RE | Admit: 2022-10-10 | Discharge: 2022-10-10 | Disposition: A | Discharge status: Home / Self Care | Payer: Medicare PPO | Source: Ambulatory Visit | Attending: Cardiology | Admitting: Cardiology

## 2022-10-10 ENCOUNTER — Encounter: Payer: Self-pay | Admitting: Cardiology

## 2022-10-10 ENCOUNTER — Telehealth: Payer: Self-pay | Admitting: Cardiology

## 2022-10-10 ENCOUNTER — Ambulatory Visit: Payer: Medicare PPO | Admitting: Cardiology

## 2022-10-10 VITALS — BP 116/70 | HR 64 | Ht 62.5 in | Wt 166.2 lb

## 2022-10-10 DIAGNOSIS — R0602 Shortness of breath: Secondary | ICD-10-CM

## 2022-10-10 DIAGNOSIS — I5032 Chronic diastolic (congestive) heart failure: Secondary | ICD-10-CM | POA: Diagnosis not present

## 2022-10-10 DIAGNOSIS — Z952 Presence of prosthetic heart valve: Secondary | ICD-10-CM

## 2022-10-10 DIAGNOSIS — Z79899 Other long term (current) drug therapy: Secondary | ICD-10-CM

## 2022-10-10 DIAGNOSIS — Z5181 Encounter for therapeutic drug level monitoring: Secondary | ICD-10-CM

## 2022-10-10 DIAGNOSIS — J90 Pleural effusion, not elsewhere classified: Secondary | ICD-10-CM | POA: Diagnosis not present

## 2022-10-10 DIAGNOSIS — I10 Essential (primary) hypertension: Secondary | ICD-10-CM

## 2022-10-10 LAB — POCT INR: INR: 2.5 (ref 2.0–3.0)

## 2022-10-10 MED ORDER — TORSEMIDE 20 MG PO TABS: ORAL_TABLET | ORAL | Status: DC

## 2022-10-10 NOTE — Telephone Encounter (Signed)
Husband notified - order mailed to patient home - she will do at [facility] in 2 weeks.

## 2022-10-10 NOTE — Patient Instructions (Signed)
Take warfarin 1 1/2 tablets tonight then resume 1 tablet daily except for 1.5 tablets on Sundays and Thursdays. Recheck INR in 5 weeks.

## 2022-10-10 NOTE — Progress Notes (Signed)
Clinical Summary [name] is a 77 y.o.female seen today for the following medical problems. This is a focused visit on recent issues with SOB.      1. Valvular heart disease   - 07/18/13 patient underwent MVR with 25 mm Edwards Magna-ease pericardial valve and also AVR with 21 mm Edwards Magna-ease pericardial valve    -03/2022 echo: LVEF 03%, indet diastolic, normal MVR and AVR.   - some low oxygen levels at home to 84%. PCP increased lasix, O2 sats improved.No edema, no weight gain. Chronic cough.       - taking lasix '80mg'$  bid - pcp did not blood in October - 07/05/2022 Cr 1.12 K 4 - home weights 161-164 lbs.      2.SOB -admitted 03/2022 with SOB, hypoxia - from notes O2 sats down to 89% on room air.  She reports dyspnea and cough.  Portable chest x-ray suggesting right base atelectasis or developing infection, also loculated pleural fluid in right lateral lung slightly increased from prior.  Also possible component of congestive heart failure. - 03/2022 echo: LVEF 50%, indet diastolic, normal MVR and AVR.  - BNP 297     - intermittent SOB at home, sats will get down to mid 80s - reports takes additional lasix and symptoms and O2 sats improve     - last 09/02/22 visit we changed lasix to torsemide '40mg'$  bid.  - Cr uptrend from 0.8 to 1.18, BNP 118 - with diuretic change SOB mildly improved, now drop in O2 sats are fairly infrequent. She reports breathing has improved.      Other medical issues not addressed this visit   3. HTN - compliant with meds       4. Afib   - hx of cardioembolic CVA, has been on longterm anticoag -- no recent palpitations - no bleeding on coumadin    - has had some palpitations, 1-2 times per day. Lasts just a few seconds     4. Hyperlipidemia - recent labs with pcp, compliant with stin   06/2021 TC 185 TG 75 HDL 66 LDL 104 01/2022 TC 177 TG 133 HDL 58 LDL 96   5. Carotid stenosis - 07/2013 [place] with mild bilateral stenosis 2018  mild disease - 01/2021 [place] mild bilateral disease        6.  2014 PFTs moderate obstruction           SH: daughter with Crohns with disease, they spend a lot of time supporting her Past Medical History:  Diagnosis Date   Anemia    Asthma    Atrial fibrillation ([place])    Breast carcinoma ([place]) 1995   0938   Cardioembolic stroke ([place]) 10/8297   Right frontal in 01/2012; normal carotid ultrasound; possible LAA thrombus by TEE; virtual complete neurologic recovery   Chronic kidney disease, stage 2, mildly decreased GFR    GFR of approximately 60   Depression    Diabetes mellitus without complication ([place])    Diverticulosis of colon (without mention of hemorrhage) 2012   Dr. Laural [name]   Fasting hyperglycemia    120 fasting   Gastroesophageal reflux disease    Gastroparesis    Hemorrhoids    Hyperlipidemia    Hypertension    pt denies 05/30/13     Dr [name]   Hyponatremia    Rheumatic heart disease    a. 07/2013 Echo: Ef 55-60%, Mod AS/AI, Mod-Sev MS, sev dil LA, PASP 58;  b.  07/2013 TEE: EF 35-40%, mild-mod AS/AI, mod-sev MS, LA smoke;  c. 07/2013 Cath: elev R heart pressures, nl cors.   Shortness of breath      Allergies  Allergen Reactions   Amiodarone Rash     Current Outpatient Medications  Medication Sig Dispense Refill   albuterol (PROVENTIL) (2.5 MG/3ML) 0.083% nebulizer solution Take 3 mLs (2.5 mg total) by nebulization every 4 (four) hours as needed for wheezing or shortness of breath. (Patient taking differently: Take 2.5 mg by nebulization 2 (two) times daily as needed for wheezing or shortness of breath.) 75 mL 12   atorvastatin (LIPITOR) 40 MG tablet TAKE ONE TABLET BY MOUTH ONCE DAILY. 90 tablet 3   budesonide (PULMICORT) 0.25 MG/2ML nebulizer solution One 0.25 mg  twice daily  along with albuterol 120 mL 11   cetirizine (ZYRTEC) 10 MG tablet Take 10 mg by mouth daily.     chlorhexidine (PERIDEX) 0.12 % solution Use as directed 15 mLs in the mouth or  throat 2 (two) times daily.     dextromethorphan-guaiFENesin (MUCINEX DM) 30-600 MG 12hr tablet Take 1 tablet by mouth 2 (two) times daily.     diltiazem (CARDIZEM CD) 180 MG 24 hr capsule Take 1 capsule (180 mg total) by mouth daily. 30 capsule 1   escitalopram (LEXAPRO) 10 MG tablet Take 10 mg by mouth daily.      famotidine (PEPCID) 20 MG tablet TAKE 1 TABLET AFTER SUPPER 30 tablet 0   fluticasone (FLONASE) 50 MCG/ACT nasal spray Place 1 spray into both nostrils daily as needed for allergies.      gabapentin (NEURONTIN) 100 MG capsule TAKE 1 CAPSULE BY MOUTH FOUR TIMES DAILY. 120 capsule 3   levothyroxine (SYNTHROID) 100 MCG tablet Take 100 mcg by mouth daily before breakfast.     metFORMIN (GLUCOPHAGE) 500 MG tablet Take 1 tablet (500 mg total) by mouth 2 (two) times daily with a meal. 60 tablet 1   metoprolol tartrate (LOPRESSOR) 25 MG tablet Take 1.5 tablets (37.5 mg total) by mouth 2 (two) times daily. 270 tablet 1   Multiple Vitamin (MULITIVITAMIN WITH MINERALS) TABS Take 1 tablet by mouth daily.     olmesartan (BENICAR) 5 MG tablet Take 5 mg by mouth daily.     pantoprazole (PROTONIX) 40 MG tablet TAKE 1 TABLET 30 TO 60 MINUTES BEFORE FIRST MEAL OF THE DAY. 30 tablet 11   potassium chloride SA (KLOR-CON M) 20 MEQ tablet Take 1 tablet (20 mEq total) by mouth 2 (two) times daily. 60 tablet 0   PREVIDENT 5000 BOOSTER PLUS 1.1 % PSTE Place 1 application  onto teeth daily.     torsemide (DEMADEX) 20 MG tablet Take 2 tablets (40 mg total) by mouth 2 (two) times daily. 120 tablet 6   warfarin (COUMADIN) 2.5 MG tablet TAKE AS DIRECTED BY COUMADIN CLINIC. (Patient taking differently: Take 2.5 mg by mouth See admin instructions. Take 2.5 daily and on Sunday and Thursday add additional 1/2 tablet) 45 tablet 5   No current facility-administered medications for this visit.     Past Surgical History:  Procedure Laterality Date   AORTIC VALVE REPLACEMENT N/A 07/18/2013   Procedure: AORTIC VALVE  REPLACEMENT (AVR);  Surgeon: Sara Pollack, MD;  Location: [facility];  Service: Open Heart Surgery;  Laterality: N/A;   BACK SURGERY     BREAST LUMPECTOMY Right 1995   CARDIAC CATHETERIZATION     CARDIOVERSION N/A 07/26/2013   Procedure: CARDIOVERSION;  Surgeon: Fay Records,  MD;  Location: [facility];  Service: Cardiovascular;  Laterality: N/A;   COLONOSCOPY  2012   Negative screening procedure   DILATION AND CURETTAGE OF UTERUS     ESOPHAGEAL MANOMETRY N/A 06/17/2013   Procedure: ESOPHAGEAL MANOMETRY (EM);  Surgeon: Sable Feil, MD;  Location: [facility];  Service: Endoscopy;  Laterality: N/A;   INTRAOPERATIVE TRANSESOPHAGEAL ECHOCARDIOGRAM N/A 07/18/2013   Procedure: INTRAOPERATIVE TRANSESOPHAGEAL ECHOCARDIOGRAM;  Surgeon: Sara Pollack, MD;  Location: [facility];  Service: Open Heart Surgery;  Laterality: N/A;   LEFT AND RIGHT HEART CATHETERIZATION WITH CORONARY ANGIOGRAM N/A 07/10/2013   Procedure: LEFT AND RIGHT HEART CATHETERIZATION WITH CORONARY ANGIOGRAM;  Surgeon: Burnell Blanks, MD;  Location: [facility] CATH LAB;  Service: Cardiovascular;  Laterality: N/A;   LUMBAR LAMINECTOMY/DECOMPRESSION MICRODISCECTOMY Right 05/13/2014   Procedure: LUMBAR LAMINECTOMY/DECOMPRESSION MICRODISCECTOMY 1 LEVEL  lumbar four/five;  Surgeon: Faythe Ghee, MD;  Location: [facility];  Service: Neurosurgery;  Laterality: Right;   MITRAL VALVE REPLACEMENT N/A 07/18/2013   Procedure: MITRAL VALVE (MV) REPLACEMENT;  Surgeon: Sara Pollack, MD;  Location: [facility];  Service: Open Heart Surgery;  Laterality: N/A;   MITRAL VALVE [place], closed mitral valvulotomy by finger fracture   TEE WITHOUT CARDIOVERSION  01/24/2012   Procedure: TRANSESOPHAGEAL ECHOCARDIOGRAM (TEE);  Surgeon: Lelon Perla, MD;  Location: [facility] ENDOSCOPY;  Service: Cardiovascular;  Laterality: N/A;   TEE WITHOUT CARDIOVERSION N/A 07/11/2013   Procedure: TRANSESOPHAGEAL ECHOCARDIOGRAM (TEE);  Surgeon: Thayer Headings, MD;   Location: [facility];  Service: Cardiovascular;  Laterality: N/A;   TEE WITHOUT CARDIOVERSION N/A 07/26/2013   Procedure: TRANSESOPHAGEAL ECHOCARDIOGRAM (TEE);  Surgeon: Fay Records, MD;  Location: [facility] ENDOSCOPY;  Service: Cardiovascular;  Laterality: N/A;   TUBAL LIGATION  1973     Allergies  Allergen Reactions   Amiodarone Rash      Family History  Problem Relation Age of Onset   Heart disease Brother 64       MI   Rheum arthritis Maternal Grandmother    Asthma Maternal Grandfather    Hypothyroidism Mother    Diabetes Sister    Cirrhosis Father      Social History Ms. Dukes reports that she has never smoked. She has never used smokeless tobacco. Ms. Donofrio reports no history of alcohol use.   Review of Systems CONSTITUTIONAL: No weight loss, fever, chills, weakness or fatigue.  HEENT: Eyes: No visual loss, blurred vision, double vision or yellow sclerae.No hearing loss, sneezing, congestion, runny nose or sore throat.  SKIN: No rash or itching.  CARDIOVASCULAR: per hpi RESPIRATORY: per hpi GASTROINTESTINAL: No anorexia, nausea, vomiting or diarrhea. No abdominal pain or blood.  GENITOURINARY: No burning on urination, no polyuria NEUROLOGICAL: No headache, dizziness, syncope, paralysis, ataxia, numbness or tingling in the extremities. No change in bowel or bladder control.  MUSCULOSKELETAL: No muscle, back pain, joint pain or stiffness.  LYMPHATICS: No enlarged nodes. No history of splenectomy.  PSYCHIATRIC: No history of depression or anxiety.  ENDOCRINOLOGIC: No reports of sweating, cold or heat intolerance. No polyuria or polydipsia.  Marland Kitchen   Physical Examination Today's Vitals   10/10/22 1148  BP: 116/70  Pulse: 64  SpO2: 96%  Weight: 166 lb 3.2 oz (75.4 kg)  Height: 5' 2.5" (1.588 m)   Body mass index is 29.91 kg/m.  Gen: resting comfortably, no acute distress HEENT: no scleral icterus, pupils equal round and reactive, no palptable cervical  adenopathy,  CV: SWNIO,2/7 systolic murmur rusb  Resp: Clear to auscultation bilaterally GI: abdomen is soft, non-tender, non-distended, normal bowel sounds, no hepatosplenomegaly MSK: extremities are warm, no edema.  Skin: warm, no rash Neuro:  no focal deficits Psych: appropriate affect   Diagnostic Studies 07/2013 TTE Study Conclusions  - Study data: Technically difficult study. - Left ventricle: The cavity size was normal. Wall thickness   was normal. Systolic function was normal. The estimated   ejection fraction was in the range of 55% to 60%.   Indeterminate diastolic function. There is evidence of   very high left atrial pressures (E/e' 63) - Aortic valve: Trileaflet; mildly thickened leaflets.   Accurate evaluation of aortic stenosis limited in the   setting of severe mitral stenosis and decreased   ventricular loadin. Morphologically there does not appear   to be significant stenosis. By gradient (mean PG 21 mmHg)   the stenosis is moderate, by AVA VTI (0.8) stenosis is   severe. Moderate regurgitation, AR PHT is 439 cm/s. Valve   area: 0.78cm^2(VTI). Valve area: 0.78cm^2 (Vmax). - Mitral valve: The findings are consistent with moderate to   severe stenosis. (Mean PG 12 mmHg, MVA by PHT 1.3. Cannot   use MVA by VTI due to moderate AI. Valve area by pressure   half-time: 1.38cm^2. - Left atrium: The atrium was severely dilated. - Right ventricle: Systolic function was mildly reduced, RV   TAPSE is 1.4, TV systolic annular tissue velocity 10 cm/s. - Pulmonary arteries: Systolic pressure was moderately   increased. PA peak pressure: 20m Hg (S) (PASP based on a   fairly faint spectral Doppler waveform)     07/2013 TEE Study Conclusions  - Left ventricle: Systolic function was moderately reduced.   The estimated ejection fraction was in the range of 35% to   40%. - Aortic valve: There was mild to moderate stenosis. Mild to   moderate regurgitation. - Mitral valve:  The findings are consistent with moderate to   severe stenosis. Valve area by pressure half-time:   1.45cm^2. - Left atrium: There was spontaneous echo contrast   ("smoke"). - Atrial septum: No defect or patent foramen ovale was   identified. - Tricuspid valve: No evidence of vegetation. Transesophageal echocardiography.  2D and color Doppler. Height:  Height: 157.5cm. Height: 62in.  Weight:  Weight: 75kg. Weight: 165lb.  Body mass index:  BMI: 30.2kg/m^2. Body surface area:    BSA: 1.824m.  Blood pressure: 143/56.  Patient status:  Inpatient.  Location:  [facility].      06/2018 echo Study Conclusions   - Left ventricle: The cavity size was normal. Wall thickness was   normal. Systolic function was normal. The estimated ejection   fraction was in the range of 60% to 65%. - Aortic valve: AV prosthesis is difficult to see Peak and mean   gradients through the valve are 18 and 10 mm Hg respectively. - Mitral valve: Peak and mean gradients through the valve   prosthesis are 19 and 4 mm Hg respectively MVA by P T1/2 is 3   cm2. s/p MV prosthesis. Valve prosthesis is difficult to see. - Pulmonary arteries: PA peak pressure: 48 mm Hg (S).     04/2020 echo IMPRESSIONS     1. Left ventricular ejection fraction, by estimation, is 60 to 65%. The  left ventricle has normal function. The left ventricle has no regional  wall motion abnormalities. Left ventricular diastolic parameters are  indeterminate.   2. Right ventricular systolic function is low normal. The right  ventricular size is normal. There is moderately elevated pulmonary artery  systolic pressure. The estimated right ventricular systolic pressure is  13.1 mmHg.   3. Left atrial size was mildly dilated.   4. The mitral valve has been repaired/replaced. No evidence of mitral  valve regurgitation. The mean mitral valve gradient is 5.0 mmHg. There is  a 25 mm Magna Ease bioprosthetic valve present in the mitral position.   Grossly normal function.   5. The aortic valve has been repaired/replaced. Aortic valve  regurgitation is not visualized. There is a 21 mm Magna Ease bioprosthetic  valve present in the aortic position. Aortic valve mean gradient measures  7.0 mmHg. Grossly normal function.   6. The inferior vena cava is normal in size with greater than 50%  respiratory variability, suggesting right atrial pressure of 3 mmHg.     07/2013 Cath Impression: 1. No angiographic evidence of CAD 2. Elevated filling pressures     01/2021 nuclear stress   Atrial fibrillation present throughout. No diagnostic ST segment changes to indicate ischemia. Small, mild to moderate intensity, mid to apical anterior defect. This is fixed with the exception of a small degree of partial reversibility in the mid zone. Suggestive of scar versus variable soft tissue attenuation. This is a low risk study. Nuclear stress EF: 50%.   01/2021 carotid US Summary:  Right Carotid: Velocities in the right ICA are consistent with a 1-39%  stenosis.   Left Carotid: Velocities in the left ICA are consistent with a 1-39%  stenosis.   Vertebrals:  Bilateral vertebral arteries demonstrate antegrade flow.  Subclavians: Normal flow hemodynamics were seen in bilateral subclavian               arteries.       Assessment and Plan      1. Chronic diastolic HF/SOB - ongoing issues with intermittent fluid overload, hypoxia at home - symptoms improving since diuretic change, though had uptrend in Cr - change torsemide to '40mg'$  in AM and '20mg'$  pm, repeat bmet 2 weeks - with prior pleural effusion and ongoing SOB recheck PA/lateral CXR        Arnoldo Lenis, M.D.

## 2022-10-10 NOTE — Telephone Encounter (Signed)
Chronic diastolic HF/SOB - ongoing issues with intermittent fluid overload, hypoxia at home - symptoms improving since diuretic change, though had uptrend in Cr - change torsemide to '40mg'$  in AM and '20mg'$  pm, repeat bmet 2 weeks - with prior pleural effusion and ongoing SOB recheck PA/lateral CXR

## 2022-10-10 NOTE — Patient Instructions (Addendum)
Medication Instructions:  Decrease Torsemide to '40mg'$  every morning & '20mg'$  every evening.  May take an extra '20mg'$  as needed for swelling or shortness of breath  Continue all other medications.     Labwork: none  Testing/Procedures: Chest x-ray Office will contact with results via phone, letter or mychart.     Follow-Up: 2 months   Any Other Special Instructions Will Be Listed Below (If Applicable).   If you need a refill on your cardiac medications before your next appointment, please call your pharmacy.

## 2022-10-10 NOTE — Telephone Encounter (Signed)
Patient's husband states the patient is supposed to have lab work done in a month, but the AVS said no lab work was ordered. He would like a call back when the orders are put in.

## 2022-10-11 ENCOUNTER — Other Ambulatory Visit: Payer: Self-pay | Admitting: Internal Medicine

## 2022-10-11 DIAGNOSIS — I5032 Chronic diastolic (congestive) heart failure: Secondary | ICD-10-CM | POA: Diagnosis not present

## 2022-10-11 DIAGNOSIS — Z79899 Other long term (current) drug therapy: Secondary | ICD-10-CM | POA: Diagnosis not present

## 2022-10-18 DIAGNOSIS — N1831 Chronic kidney disease, stage 3a: Secondary | ICD-10-CM | POA: Diagnosis not present

## 2022-10-18 DIAGNOSIS — E1122 Type 2 diabetes mellitus with diabetic chronic kidney disease: Secondary | ICD-10-CM | POA: Diagnosis not present

## 2022-10-18 DIAGNOSIS — I482 Chronic atrial fibrillation, unspecified: Secondary | ICD-10-CM | POA: Diagnosis not present

## 2022-10-18 DIAGNOSIS — R7309 Other abnormal glucose: Secondary | ICD-10-CM | POA: Diagnosis not present

## 2022-10-19 DIAGNOSIS — H43393 Other vitreous opacities, bilateral: Secondary | ICD-10-CM | POA: Diagnosis not present

## 2022-10-26 DIAGNOSIS — R0602 Shortness of breath: Secondary | ICD-10-CM | POA: Diagnosis not present

## 2022-10-26 DIAGNOSIS — Z79899 Other long term (current) drug therapy: Secondary | ICD-10-CM | POA: Diagnosis not present

## 2022-10-26 DIAGNOSIS — I5032 Chronic diastolic (congestive) heart failure: Secondary | ICD-10-CM | POA: Diagnosis not present

## 2022-10-26 DIAGNOSIS — I10 Essential (primary) hypertension: Secondary | ICD-10-CM | POA: Diagnosis not present

## 2022-10-27 ENCOUNTER — Telehealth: Payer: Self-pay | Admitting: *Deleted

## 2022-10-27 LAB — BASIC METABOLIC PANEL
BUN/Creatinine Ratio: 12 (ref 12–28)
BUN: 16 mg/dL (ref 8–27)
CO2: 22 mmol/L (ref 20–29)
Calcium: 9.3 mg/dL (ref 8.7–10.3)
Chloride: 98 mmol/L (ref 96–106)
Creatinine, Ser: 1.38 mg/dL — ABNORMAL HIGH (ref 0.57–1.00)
Glucose: 142 mg/dL — ABNORMAL HIGH (ref 70–99)
Potassium: 4.3 mmol/L (ref 3.5–5.2)
Sodium: 141 mmol/L (ref 134–144)
eGFR: 39 mL/min/{1.73_m2} — ABNORMAL LOW (ref >59)

## 2022-10-27 NOTE — Telephone Encounter (Signed)
-----  Message from Arnoldo Lenis, MD sent at 10/26/2022  9:41 AM EST ----- No significant findings on CXR  Zandra Abts MD

## 2022-10-27 NOTE — Telephone Encounter (Signed)
Laurine Blazer, LPN 2/48/1859  0:93 AM EST Back to Top    Notified, copy to pcp.

## 2022-11-07 ENCOUNTER — Other Ambulatory Visit: Payer: Self-pay | Admitting: Cardiology

## 2022-11-07 DIAGNOSIS — S46911A Strain of unspecified muscle, fascia and tendon at shoulder and upper arm level, right arm, initial encounter: Secondary | ICD-10-CM | POA: Diagnosis not present

## 2022-11-09 DIAGNOSIS — M75100 Unspecified rotator cuff tear or rupture of unspecified shoulder, not specified as traumatic: Secondary | ICD-10-CM | POA: Diagnosis not present

## 2022-11-11 ENCOUNTER — Emergency Department (HOSPITAL_COMMUNITY): Payer: Medicare PPO

## 2022-11-11 ENCOUNTER — Inpatient Hospital Stay (HOSPITAL_COMMUNITY)
Admission: EM | Admit: 2022-11-11 | Discharge: 2022-11-14 | DRG: 309 | Disposition: A | Discharge status: Home / Self Care | Payer: Medicare PPO | Attending: Internal Medicine | Admitting: Internal Medicine

## 2022-11-11 ENCOUNTER — Other Ambulatory Visit: Payer: Self-pay

## 2022-11-11 ENCOUNTER — Observation Stay (HOSPITAL_COMMUNITY): Payer: Medicare PPO

## 2022-11-11 ENCOUNTER — Encounter: Payer: Self-pay | Admitting: Orthopedic Surgery

## 2022-11-11 DIAGNOSIS — E782 Mixed hyperlipidemia: Secondary | ICD-10-CM | POA: Diagnosis present

## 2022-11-11 DIAGNOSIS — Z825 Family history of asthma and other chronic lower respiratory diseases: Secondary | ICD-10-CM | POA: Diagnosis not present

## 2022-11-11 DIAGNOSIS — F32A Depression, unspecified: Secondary | ICD-10-CM | POA: Diagnosis present

## 2022-11-11 DIAGNOSIS — R42 Dizziness and giddiness: Secondary | ICD-10-CM | POA: Diagnosis not present

## 2022-11-11 DIAGNOSIS — E1122 Type 2 diabetes mellitus with diabetic chronic kidney disease: Secondary | ICD-10-CM | POA: Diagnosis present

## 2022-11-11 DIAGNOSIS — R609 Edema, unspecified: Secondary | ICD-10-CM | POA: Diagnosis present

## 2022-11-11 DIAGNOSIS — I639 Cerebral infarction, unspecified: Secondary | ICD-10-CM | POA: Diagnosis not present

## 2022-11-11 DIAGNOSIS — N1832 Chronic kidney disease, stage 3b: Secondary | ICD-10-CM | POA: Diagnosis present

## 2022-11-11 DIAGNOSIS — Z953 Presence of xenogenic heart valve: Secondary | ICD-10-CM | POA: Diagnosis not present

## 2022-11-11 DIAGNOSIS — Z853 Personal history of malignant neoplasm of breast: Secondary | ICD-10-CM

## 2022-11-11 DIAGNOSIS — E039 Hypothyroidism, unspecified: Secondary | ICD-10-CM | POA: Diagnosis present

## 2022-11-11 DIAGNOSIS — I4891 Unspecified atrial fibrillation: Secondary | ICD-10-CM | POA: Diagnosis not present

## 2022-11-11 DIAGNOSIS — I440 Atrioventricular block, first degree: Secondary | ICD-10-CM | POA: Diagnosis present

## 2022-11-11 DIAGNOSIS — M19011 Primary osteoarthritis, right shoulder: Secondary | ICD-10-CM | POA: Diagnosis not present

## 2022-11-11 DIAGNOSIS — M75111 Incomplete rotator cuff tear or rupture of right shoulder, not specified as traumatic: Secondary | ICD-10-CM | POA: Diagnosis present

## 2022-11-11 DIAGNOSIS — I6523 Occlusion and stenosis of bilateral carotid arteries: Secondary | ICD-10-CM | POA: Diagnosis not present

## 2022-11-11 DIAGNOSIS — Z888 Allergy status to other drugs, medicaments and biological substances status: Secondary | ICD-10-CM

## 2022-11-11 DIAGNOSIS — G8929 Other chronic pain: Secondary | ICD-10-CM | POA: Diagnosis present

## 2022-11-11 DIAGNOSIS — E860 Dehydration: Secondary | ICD-10-CM | POA: Diagnosis present

## 2022-11-11 DIAGNOSIS — S199XXA Unspecified injury of neck, initial encounter: Secondary | ICD-10-CM | POA: Diagnosis not present

## 2022-11-11 DIAGNOSIS — Z833 Family history of diabetes mellitus: Secondary | ICD-10-CM

## 2022-11-11 DIAGNOSIS — Z7951 Long term (current) use of inhaled steroids: Secondary | ICD-10-CM

## 2022-11-11 DIAGNOSIS — I5032 Chronic diastolic (congestive) heart failure: Secondary | ICD-10-CM | POA: Diagnosis present

## 2022-11-11 DIAGNOSIS — N179 Acute kidney failure, unspecified: Secondary | ICD-10-CM | POA: Diagnosis present

## 2022-11-11 DIAGNOSIS — E785 Hyperlipidemia, unspecified: Secondary | ICD-10-CM | POA: Diagnosis present

## 2022-11-11 DIAGNOSIS — Z8673 Personal history of transient ischemic attack (TIA), and cerebral infarction without residual deficits: Secondary | ICD-10-CM | POA: Diagnosis not present

## 2022-11-11 DIAGNOSIS — I951 Orthostatic hypotension: Secondary | ICD-10-CM | POA: Diagnosis not present

## 2022-11-11 DIAGNOSIS — M25511 Pain in right shoulder: Secondary | ICD-10-CM | POA: Diagnosis not present

## 2022-11-11 DIAGNOSIS — Z7984 Long term (current) use of oral hypoglycemic drugs: Secondary | ICD-10-CM

## 2022-11-11 DIAGNOSIS — S46011A Strain of muscle(s) and tendon(s) of the rotator cuff of right shoulder, initial encounter: Secondary | ICD-10-CM | POA: Diagnosis not present

## 2022-11-11 DIAGNOSIS — I4892 Unspecified atrial flutter: Secondary | ICD-10-CM

## 2022-11-11 DIAGNOSIS — I130 Hypertensive heart and chronic kidney disease with heart failure and stage 1 through stage 4 chronic kidney disease, or unspecified chronic kidney disease: Secondary | ICD-10-CM | POA: Diagnosis present

## 2022-11-11 DIAGNOSIS — Z8249 Family history of ischemic heart disease and other diseases of the circulatory system: Secondary | ICD-10-CM | POA: Diagnosis not present

## 2022-11-11 DIAGNOSIS — I498 Other specified cardiac arrhythmias: Secondary | ICD-10-CM

## 2022-11-11 DIAGNOSIS — Z79899 Other long term (current) drug therapy: Secondary | ICD-10-CM | POA: Diagnosis not present

## 2022-11-11 DIAGNOSIS — Z7901 Long term (current) use of anticoagulants: Secondary | ICD-10-CM | POA: Diagnosis not present

## 2022-11-11 DIAGNOSIS — I484 Atypical atrial flutter: Secondary | ICD-10-CM | POA: Diagnosis not present

## 2022-11-11 DIAGNOSIS — Z952 Presence of prosthetic heart valve: Secondary | ICD-10-CM | POA: Diagnosis not present

## 2022-11-11 DIAGNOSIS — I482 Chronic atrial fibrillation, unspecified: Secondary | ICD-10-CM | POA: Diagnosis present

## 2022-11-11 DIAGNOSIS — R531 Weakness: Secondary | ICD-10-CM | POA: Diagnosis not present

## 2022-11-11 DIAGNOSIS — E119 Type 2 diabetes mellitus without complications: Secondary | ICD-10-CM

## 2022-11-11 DIAGNOSIS — E1169 Type 2 diabetes mellitus with other specified complication: Secondary | ICD-10-CM

## 2022-11-11 DIAGNOSIS — E038 Other specified hypothyroidism: Secondary | ICD-10-CM | POA: Diagnosis not present

## 2022-11-11 DIAGNOSIS — N189 Chronic kidney disease, unspecified: Secondary | ICD-10-CM | POA: Diagnosis not present

## 2022-11-11 DIAGNOSIS — K219 Gastro-esophageal reflux disease without esophagitis: Secondary | ICD-10-CM | POA: Diagnosis present

## 2022-11-11 DIAGNOSIS — W19XXXA Unspecified fall, initial encounter: Secondary | ICD-10-CM | POA: Diagnosis not present

## 2022-11-11 DIAGNOSIS — J4489 Other specified chronic obstructive pulmonary disease: Secondary | ICD-10-CM | POA: Diagnosis present

## 2022-11-11 DIAGNOSIS — I10 Essential (primary) hypertension: Secondary | ICD-10-CM | POA: Diagnosis not present

## 2022-11-11 DIAGNOSIS — I129 Hypertensive chronic kidney disease with stage 1 through stage 4 chronic kidney disease, or unspecified chronic kidney disease: Secondary | ICD-10-CM | POA: Diagnosis not present

## 2022-11-11 DIAGNOSIS — Z8261 Family history of arthritis: Secondary | ICD-10-CM

## 2022-11-11 DIAGNOSIS — R001 Bradycardia, unspecified: Secondary | ICD-10-CM | POA: Diagnosis present

## 2022-11-11 DIAGNOSIS — R55 Syncope and collapse: Secondary | ICD-10-CM | POA: Diagnosis present

## 2022-11-11 DIAGNOSIS — I959 Hypotension, unspecified: Secondary | ICD-10-CM | POA: Diagnosis not present

## 2022-11-11 LAB — CBC WITH DIFFERENTIAL/PLATELET
Abs Immature Granulocytes: 0.04 10*3/uL (ref 0.00–0.07)
Basophils Absolute: 0.1 10*3/uL (ref 0.0–0.1)
Basophils Relative: 1 %
Eosinophils Absolute: 0.1 10*3/uL (ref 0.0–0.5)
Eosinophils Relative: 2 %
HCT: 41.5 % (ref 36.0–46.0)
Hemoglobin: 13.7 g/dL (ref 12.0–15.0)
Immature Granulocytes: 1 %
Lymphocytes Relative: 30 %
Lymphs Abs: 2 10*3/uL (ref 0.7–4.0)
MCH: 30.5 pg (ref 26.0–34.0)
MCHC: 33 g/dL (ref 30.0–36.0)
MCV: 92.4 fL (ref 80.0–100.0)
Monocytes Absolute: 0.6 10*3/uL (ref 0.1–1.0)
Monocytes Relative: 9 %
Neutro Abs: 3.8 10*3/uL (ref 1.7–7.7)
Neutrophils Relative %: 57 %
Platelets: 166 10*3/uL (ref 150–400)
RBC: 4.49 MIL/uL (ref 3.87–5.11)
RDW: 14.5 % (ref 11.5–15.5)
WBC: 6.5 10*3/uL (ref 4.0–10.5)
nRBC: 0 % (ref 0.0–0.2)

## 2022-11-11 LAB — COMPREHENSIVE METABOLIC PANEL
ALT: 22 U/L (ref 0–44)
AST: 25 U/L (ref 15–41)
Albumin: 4 g/dL (ref 3.5–5.0)
Alkaline Phosphatase: 52 U/L (ref 38–126)
Anion gap: 14 (ref 5–15)
BUN: 28 mg/dL — ABNORMAL HIGH (ref 8–23)
CO2: 26 mmol/L (ref 22–32)
Calcium: 8.7 mg/dL — ABNORMAL LOW (ref 8.9–10.3)
Chloride: 94 mmol/L — ABNORMAL LOW (ref 98–111)
Creatinine, Ser: 1.39 mg/dL — ABNORMAL HIGH (ref 0.44–1.00)
GFR, Estimated: 39 mL/min — ABNORMAL LOW (ref >60)
Glucose, Bld: 181 mg/dL — ABNORMAL HIGH (ref 70–99)
Potassium: 3.7 mmol/L (ref 3.5–5.1)
Sodium: 134 mmol/L — ABNORMAL LOW (ref 135–145)
Total Bilirubin: 0.7 mg/dL (ref 0.3–1.2)
Total Protein: 7 g/dL (ref 6.5–8.1)

## 2022-11-11 LAB — URINALYSIS, ROUTINE W REFLEX MICROSCOPIC
Bilirubin Urine: NEGATIVE
Glucose, UA: NEGATIVE mg/dL
Hgb urine dipstick: NEGATIVE
Ketones, ur: NEGATIVE mg/dL
Nitrite: NEGATIVE
Protein, ur: NEGATIVE mg/dL
Specific Gravity, Urine: 1.01 (ref 1.005–1.030)
WBC, UA: >50 WBC/hpf (ref 0–5)
pH: 6 (ref 5.0–8.0)

## 2022-11-11 LAB — GLUCOSE, CAPILLARY: Glucose-Capillary: 168 mg/dL — ABNORMAL HIGH (ref 70–99)

## 2022-11-11 LAB — BRAIN NATRIURETIC PEPTIDE: B Natriuretic Peptide: 137 pg/mL — ABNORMAL HIGH (ref 0.0–100.0)

## 2022-11-11 LAB — TSH
TSH: 8.696 u[IU]/mL — ABNORMAL HIGH (ref 0.350–4.500)
TSH: 4.932 u[IU]/mL — ABNORMAL HIGH (ref 0.350–4.500)

## 2022-11-11 LAB — CBG MONITORING, ED
Glucose-Capillary: 200 mg/dL — ABNORMAL HIGH (ref 70–99)
Glucose-Capillary: 135 mg/dL — ABNORMAL HIGH (ref 70–99)

## 2022-11-11 LAB — PROTIME-INR
INR: 3.6 — ABNORMAL HIGH (ref 0.8–1.2)
Prothrombin Time: 35.4 seconds — ABNORMAL HIGH (ref 11.4–15.2)

## 2022-11-11 LAB — HEMOGLOBIN A1C
Hgb A1c MFr Bld: 7.2 % — ABNORMAL HIGH (ref 4.8–5.6)
Mean Plasma Glucose: 159.94 mg/dL

## 2022-11-11 MED ORDER — MELATONIN 3 MG PO TABS
3.0000 mg | ORAL_TABLET | Freq: Every evening | ORAL | Status: DC | PRN
  Administered 2022-11-11: 3 mg via ORAL
  Filled 2022-11-11: qty 1

## 2022-11-11 MED ORDER — WARFARIN SODIUM 1 MG PO TABS
1.0000 mg | ORAL_TABLET | Freq: Once | ORAL | Status: AC
  Administered 2022-11-11: 1 mg via ORAL
  Filled 2022-11-11 (×2): qty 1

## 2022-11-11 MED ORDER — SODIUM CHLORIDE 0.9 % IV SOLN: INTRAVENOUS | Status: DC

## 2022-11-11 MED ORDER — ONDANSETRON HCL 4 MG PO TABS
4.0000 mg | ORAL_TABLET | Freq: Four times a day (QID) | ORAL | Status: DC | PRN
  Administered 2022-11-12: 4 mg via ORAL
  Filled 2022-11-11: qty 1

## 2022-11-11 MED ORDER — INSULIN ASPART 100 UNIT/ML IJ SOLN: 0.0000 [IU] | Freq: Every day | INTRAMUSCULAR | Status: DC

## 2022-11-11 MED ORDER — ACETAMINOPHEN 650 MG RE SUPP: 650.0000 mg | Freq: Four times a day (QID) | RECTAL | Status: DC | PRN

## 2022-11-11 MED ORDER — ACETAMINOPHEN 325 MG PO TABS
650.0000 mg | ORAL_TABLET | Freq: Four times a day (QID) | ORAL | Status: DC | PRN
  Administered 2022-11-12: 650 mg via ORAL
  Filled 2022-11-11: qty 2

## 2022-11-11 MED ORDER — PANTOPRAZOLE SODIUM 40 MG PO TBEC
40.0000 mg | DELAYED_RELEASE_TABLET | Freq: Every day | ORAL | Status: DC
  Administered 2022-11-12 – 2022-11-14 (×3): 40 mg via ORAL
  Filled 2022-11-11 (×3): qty 1

## 2022-11-11 MED ORDER — MORPHINE SULFATE (PF) 2 MG/ML IV SOLN
1.0000 mg | Freq: Once | INTRAVENOUS | Status: AC
  Administered 2022-11-11: 1 mg via INTRAVENOUS
  Filled 2022-11-11: qty 1

## 2022-11-11 MED ORDER — ESCITALOPRAM OXALATE 10 MG PO TABS
10.0000 mg | ORAL_TABLET | Freq: Every day | ORAL | Status: DC
  Administered 2022-11-12 – 2022-11-14 (×3): 10 mg via ORAL
  Filled 2022-11-11 (×3): qty 1

## 2022-11-11 MED ORDER — LORATADINE 10 MG PO TABS
10.0000 mg | ORAL_TABLET | Freq: Every day | ORAL | Status: DC
  Administered 2022-11-12 – 2022-11-14 (×3): 10 mg via ORAL
  Filled 2022-11-11 (×3): qty 1

## 2022-11-11 MED ORDER — INSULIN ASPART 100 UNIT/ML IJ SOLN
0.0000 [IU] | Freq: Three times a day (TID) | INTRAMUSCULAR | Status: DC
  Administered 2022-11-12 – 2022-11-14 (×6): 1 [IU] via SUBCUTANEOUS

## 2022-11-11 MED ORDER — LEVOTHYROXINE SODIUM 100 MCG PO TABS
100.0000 ug | ORAL_TABLET | Freq: Every day | ORAL | Status: DC
  Administered 2022-11-12 – 2022-11-14 (×3): 100 ug via ORAL
  Filled 2022-11-11 (×3): qty 1

## 2022-11-11 MED ORDER — BUDESONIDE 0.5 MG/2ML IN SUSP
0.2500 mg | Freq: Two times a day (BID) | RESPIRATORY_TRACT | Status: DC
  Administered 2022-11-11 – 2022-11-14 (×6): 0.25 mg via RESPIRATORY_TRACT
  Filled 2022-11-11 (×6): qty 2

## 2022-11-11 MED ORDER — SODIUM CHLORIDE 0.9 % IV SOLN: INTRAVENOUS | Status: AC

## 2022-11-11 MED ORDER — GABAPENTIN 100 MG PO CAPS
100.0000 mg | ORAL_CAPSULE | Freq: Four times a day (QID) | ORAL | Status: DC
  Administered 2022-11-11 – 2022-11-14 (×11): 100 mg via ORAL
  Filled 2022-11-11 (×11): qty 1

## 2022-11-11 MED ORDER — HYDROCODONE-ACETAMINOPHEN 5-325 MG PO TABS
1.0000 | ORAL_TABLET | Freq: Four times a day (QID) | ORAL | Status: DC | PRN
  Administered 2022-11-11 – 2022-11-14 (×5): 1 via ORAL
  Filled 2022-11-11 (×5): qty 1

## 2022-11-11 MED ORDER — ATORVASTATIN CALCIUM 40 MG PO TABS
40.0000 mg | ORAL_TABLET | Freq: Every day | ORAL | Status: DC
  Administered 2022-11-12 – 2022-11-14 (×3): 40 mg via ORAL
  Filled 2022-11-11 (×3): qty 1

## 2022-11-11 MED ORDER — WARFARIN - PHARMACIST DOSING INPATIENT: Freq: Every day | Status: DC

## 2022-11-11 MED ORDER — ALBUTEROL SULFATE HFA 108 (90 BASE) MCG/ACT IN AERS: 1.0000 | INHALATION_SPRAY | Freq: Four times a day (QID) | RESPIRATORY_TRACT | Status: DC | PRN

## 2022-11-11 MED ORDER — ONDANSETRON HCL 4 MG/2ML IJ SOLN: 4.0000 mg | Freq: Four times a day (QID) | INTRAMUSCULAR | Status: DC | PRN

## 2022-11-11 MED ORDER — SODIUM CHLORIDE 0.9 % IV BOLUS
500.0000 mL | Freq: Once | INTRAVENOUS | Status: AC
  Administered 2022-11-11: 500 mL via INTRAVENOUS

## 2022-11-11 MED ORDER — ALBUTEROL SULFATE (2.5 MG/3ML) 0.083% IN NEBU: 2.5000 mg | INHALATION_SOLUTION | Freq: Four times a day (QID) | RESPIRATORY_TRACT | Status: DC | PRN

## 2022-11-11 MED ORDER — FENTANYL CITRATE PF 50 MCG/ML IJ SOSY
25.0000 ug | PREFILLED_SYRINGE | Freq: Once | INTRAMUSCULAR | Status: AC
  Administered 2022-11-11: 25 ug via INTRAVENOUS
  Filled 2022-11-11: qty 1

## 2022-11-11 MED ORDER — FLUTICASONE PROPIONATE 50 MCG/ACT NA SUSP
1.0000 | Freq: Every day | NASAL | Status: DC
  Administered 2022-11-12 – 2022-11-14 (×3): 1 via NASAL
  Filled 2022-11-11: qty 16

## 2022-11-11 MED ORDER — ONDANSETRON HCL 4 MG/2ML IJ SOLN
4.0000 mg | Freq: Once | INTRAMUSCULAR | Status: AC | PRN
  Administered 2022-11-11: 4 mg via INTRAVENOUS
  Filled 2022-11-11: qty 2

## 2022-11-11 MED ORDER — DM-GUAIFENESIN ER 30-600 MG PO TB12: 1.0000 | ORAL_TABLET | Freq: Two times a day (BID) | ORAL | Status: DC | PRN

## 2022-11-11 NOTE — Progress Notes (Signed)
Patient complains of right shoulder throbbing pain. 8/10. She had a norco around 1800 but according to patient it doesn't help ease her pain. MD notified.

## 2022-11-11 NOTE — Progress Notes (Signed)
ANTICOAGULATION CONSULT NOTE - Initial Consult  Pharmacy Consult for warfarin Indication: atrial fibrillation + AVR/MVR  Allergies  Allergen Reactions   Amiodarone Rash    Patient Measurements: Height: 5' 2"$  (157.5 cm) Weight: 72.6 kg (160 lb) IBW/kg (Calculated) : 50.1 Heparin Dosing Weight:   Vital Signs: Temp: 97.9 F (36.6 C) (02/09 1205) Temp Source: Oral (02/09 1205) BP: 111/75 (02/09 1430) Pulse Rate: 51 (02/09 1430)  Labs: Recent Labs    11/11/22 0835  HGB 13.7  HCT 41.5  PLT 166  LABPROT 35.4*  INR 3.6*  CREATININE 1.39*    Estimated Creatinine Clearance: 31.1 mL/min (A) (by C-G formula based on SCr of 1.39 mg/dL (H)).   Medical History: Past Medical History:  Diagnosis Date   Anemia    Asthma    Atrial fibrillation ([place])    Breast carcinoma ([place]) 1995   [id number]   Cardioembolic stroke ([place]) [id number]   Right frontal in 01/2012; normal carotid ultrasound; possible LAA thrombus by TEE; virtual complete neurologic recovery   Chronic kidney disease, stage 2, mildly decreased GFR    GFR of approximately 60   Depression    Diabetes mellitus without complication ([place])    Diverticulosis of colon (without mention of hemorrhage) 2012   Dr. Laural Golden   Fasting hyperglycemia    120 fasting   Gastroesophageal reflux disease    Gastroparesis    Hemorrhoids    Hyperlipidemia    Hypertension    pt denies 05/30/13     Dr Kathe Mariner   Hyponatremia    Rheumatic heart disease    a. 07/2013 Echo: Ef 55-60%, Mod AS/AI, Mod-Sev MS, sev dil LA, PASP 58;  b. 07/2013 TEE: EF 35-40%, mild-mod AS/AI, mod-sev MS, LA smoke;  c. 07/2013 Cath: elev R heart pressures, nl cors.   Shortness of breath     Medications:  (Not in a hospital admission)   Assessment: Pharmacy consulted to dose warfarin in patient with atrial fibrillation.  Patient's home dose listed as 3.75 mg Sun + Thurs and 2.5 mg ROW with last dose 2/8 @ 1700. INR is slightly supratherapeutic on admission at  3.6  Goal of Therapy:  INR 2.5-3.5 Monitor platelets by anticoagulation protocol: Yes   Plan:  Warfarin 1 mg x 1 dose. Monitor daily INR and s/s of bleeding.  [name], PharmD Clinical Pharmacist 11/11/2022 3:16 PM

## 2022-11-11 NOTE — ED Triage Notes (Signed)
Pt c/o dizziness and weakness with some n/v . Pt also has right shoulder pain with a hx of rotator cuff pain. Pt fell backwards when EMS arrived to he rouse but no injury. Pt has Afib and EMS stated pt was brady from 35-75.

## 2022-11-11 NOTE — ED Notes (Signed)
Unit called for report. Shift change in progress. Advised to wait 72mn. [facility] with patient now preparing to transfer.

## 2022-11-11 NOTE — ED Provider Notes (Signed)
[facility] Provider Note   CSN: [id number] Arrival date & time: 11/11/22  [id number]     History  Chief Complaint  Patient presents with   Weakness   Dizziness    [name] is a 77 y.o. female.  Patient with a history of atrial flutter patient on diltiazem and Lopressor and is on Coumadin.  Patient had a syncopal episode this morning.  Witnessed by her husband.  He caught her and got on the couch.  When called EMS when EMS was arriving he went to the door patient got up was walking towards the door and fell completely backwards and passed out again.  Patient states she had no warning that she was going to pass out.  Has not really had anything like this happen before she does have a history of frequent falls.  Patient denies anything being injured with the falls.  But had been seen previously for right shoulder pain they felt maybe his rotator cuff but has not had any x-rays.  Triage had noted that patient had some dizziness and weakness but patient seemed to deny any of that.  Patient has had prior rheumatic heart disease gastroesophageal reflux disease hyperlipidemia gastroparesis shortness of breath atrial fibrillation hypertension chronic kidney disease stage II and diabetes.  Patient had mitral valve surgery.  Patient does not use tobacco products.  Patient last seen by cardiology January 8.  Review of past EKGs show atrial flutter.  Today's EKG is atrial flutter with a 4-1 block.  Denies any symptoms currently.  The other thing was that EMS thought that her heart rate was 35-75 at home.  Patient arrived here pulse was reading 34 but we had EKG heart rate that was reading 6167.  So not sure how accurate that was oxygen sats were 90% on room air.  Temp 97.6.  Blood pressure 103/75.  Patient has had felt that she felt fine she was really not having no symptoms and just suddenly went out twice.       Home Medications Prior to Admission  medications   Medication Sig Start Date End Date Taking? Authorizing Provider  albuterol (PROVENTIL) (2.5 MG/3ML) 0.083% nebulizer solution Take 3 mLs (2.5 mg total) by nebulization every 4 (four) hours as needed for wheezing or shortness of breath. Patient taking differently: Take 2.5 mg by nebulization 2 (two) times daily as needed for wheezing or shortness of breath. 07/22/21  Yes Tanda Rockers, MD  albuterol (VENTOLIN HFA) 108 (90 Base) MCG/ACT inhaler Inhale 1-2 puffs into the lungs every 6 (six) hours as needed for wheezing or shortness of breath. 08/08/22  Yes [provider]  atorvastatin (LIPITOR) 40 MG tablet TAKE ONE TABLET BY MOUTH ONCE DAILY. Patient taking differently: Take 40 mg by mouth daily. 07/18/22  Yes Branch, Alphonse Guild, MD  budesonide (PULMICORT) 0.25 MG/2ML nebulizer solution One 0.25 mg  twice daily  along with albuterol Patient taking differently: Take 0.25 mg by nebulization 2 (two) times daily as needed (wheezing, shortness of breath). One 0.25 mg  twice daily  along with albuterol 12/13/21  Yes Tanda Rockers, MD  budesonide (PULMICORT) 0.5 MG/2ML nebulizer solution USE 1/2 VIAL IN NEBULIZER TWICE DAILY. DISCARD UNUSED PORTION AFTER EACH USE. Patient taking differently: Take 0.25 mg by nebulization 2 (two) times daily. USE 1/2 VIAL IN NEBULIZER TWICE DAILY. DISCARD UNUSED PORTION AFTER EACH USE. 10/12/22  Yes Tanda Rockers, MD  cetirizine (ZYRTEC) 10 MG tablet Take  10 mg by mouth daily.   Yes [provider]  dextromethorphan-guaiFENesin (MUCINEX DM) 30-600 MG 12hr tablet Take 1 tablet by mouth 2 (two) times daily as needed for cough.   Yes [provider]  diltiazem (CARDIZEM CD) 180 MG 24 hr capsule Take 1 capsule (180 mg total) by mouth daily. 08/03/13  Yes Collins, Gina L, PA-C  escitalopram (LEXAPRO) 10 MG tablet Take 10 mg by mouth daily.  04/10/15  Yes [provider]  famotidine (PEPCID) 20 MG tablet TAKE 1 TABLET AFTER  SUPPER Patient taking differently: Take 20 mg by mouth every evening. TAKE 1 TABLET AFTER SUPPER 07/18/22  Yes Tanda Rockers, MD  fluticasone [facility]) 50 MCG/ACT nasal spray Place 1 spray into both nostrils daily. 09/05/13  Yes BranchAlphonse Guild, MD  gabapentin (NEURONTIN) 100 MG capsule TAKE 1 CAPSULE BY MOUTH FOUR TIMES DAILY. Patient taking differently: Take 100 mg by mouth 4 (four) times daily. 08/09/22  Yes Tanda Rockers, MD  HYDROcodone-acetaminophen (NORCO/VICODIN) 5-325 MG tablet Take 1 tablet by mouth every 6 (six) hours as needed for moderate pain or severe pain. 11/09/22  Yes [provider]  levothyroxine (SYNTHROID) 100 MCG tablet Take 100 mcg by mouth daily before breakfast.   Yes [provider]  metFORMIN (GLUCOPHAGE) 500 MG tablet Take 1 tablet (500 mg total) by mouth 2 (two) times daily with a meal. 08/03/13  Yes Collins, Gina L, PA-C  metoprolol tartrate (LOPRESSOR) 25 MG tablet Take 1.5 tablets (37.5 mg total) by mouth 2 (two) times daily. 11/07/22  Yes BranchAlphonse Guild, MD  Multiple Vitamin (MULITIVITAMIN WITH MINERALS) TABS Take 1 tablet by mouth daily.   Yes [provider]  olmesartan (BENICAR) 5 MG tablet Take 5 mg by mouth daily.   Yes [provider]  pantoprazole (PROTONIX) 40 MG tablet TAKE 1 TABLET 30 TO 60 MINUTES BEFORE FIRST MEAL OF THE DAY. Patient taking differently: Take 40 mg by mouth daily before breakfast. 04/12/22  Yes Tanda Rockers, MD  potassium chloride SA (KLOR-CON M) 20 MEQ tablet Take 1 tablet (20 mEq total) by mouth 2 (two) times daily. 04/03/22 05/05/79 Yes Shah, Pratik D, DO  tiZANidine (ZANAFLEX) 2 MG tablet Take 2 mg by mouth 2 (two) times daily as needed for muscle spasms. 11/07/22  Yes [provider]  torsemide (DEMADEX) 20 MG tablet Take 2 tablets (40 mg total) by mouth every morning AND 1 tablet (20 mg total) every evening. (May take an extra 67m as needed for swelling or shortness of breath).. 10/10/22   Yes BArnoldo Lenis MD  warfarin (COUMADIN) 2.5 MG tablet TAKE AS DIRECTED BY COUMADIN CLINIC. Patient taking differently: Take 2.5-3.75 mg by mouth See admin instructions. Take 2.560mby mouth daily except on Sunday and Thursday. On Sunday and Thursday, take 3.7557my mouth. 03/09/22  Yes Branch, JAlphonse GuildD      Allergies    Amiodarone    Review of Systems   Review of Systems  Constitutional:  Negative for chills and fever.  HENT:  Negative for ear pain and sore throat.   Eyes:  Negative for pain and visual disturbance.  Respiratory:  Negative for cough and shortness of breath.   Cardiovascular:  Negative for chest pain and palpitations.  Gastrointestinal:  Negative for abdominal pain and vomiting.  Genitourinary:  Negative for dysuria and hematuria.  Musculoskeletal:  Negative for arthralgias and back pain.  Skin:  Negative for color change and rash.  Neurological:  Positive for syncope. Negative for seizures.  All other systems reviewed and are negative.   Physical Exam Updated Vital Signs BP 111/75   Pulse (!) 51   Temp 97.9 F (36.6 C) (Oral)   Resp 12   Ht 1.575 m (5' 2"$ )   Wt 72.6 kg   SpO2 90%   BMI 29.26 kg/m  Physical Exam Vitals and nursing note reviewed.  Constitutional:      General: She is not in acute distress.    Appearance: She is well-developed.  HENT:     Head: Normocephalic and atraumatic.  Eyes:     Extraocular Movements: Extraocular movements intact.     Conjunctiva/sclera: Conjunctivae normal.     Pupils: Pupils are equal, round, and reactive to light.  Cardiovascular:     Rate and Rhythm: Bradycardia present. Rhythm irregular.     Heart sounds: No murmur heard. Pulmonary:     Effort: Pulmonary effort is normal. No respiratory distress.     Breath sounds: Normal breath sounds.  Abdominal:     Palpations: Abdomen is soft.     Tenderness: There is no abdominal tenderness.  Musculoskeletal:        General: No swelling, tenderness or  deformity.     Cervical back: Neck supple.     Comments: Patient with some difficulty raising her right arm above her head.  Passively though it does not create any pain.  Radial pulse distally is intact sensation intact good strength in the hand good movement at the wrist elbow.  Skin:    General: Skin is warm and dry.     Capillary Refill: Capillary refill takes less than 2 seconds.  Neurological:     Mental Status: She is alert.  Psychiatric:        Mood and Affect: Mood normal.     ED Results / Procedures / Treatments   Labs (all labs ordered are listed, but only abnormal results are displayed) Labs Reviewed  URINALYSIS, ROUTINE W REFLEX MICROSCOPIC - Abnormal; Notable for the following components:      Result Value   APPearance HAZY (*)    Leukocytes,Ua LARGE (*)    Bacteria, UA RARE (*)    Non Squamous Epithelial 0-5 (*)    All other components within normal limits  COMPREHENSIVE METABOLIC PANEL - Abnormal; Notable for the following components:   Sodium 134 (*)    Chloride 94 (*)    Glucose, Bld 181 (*)    BUN 28 (*)    Creatinine, Ser 1.39 (*)    Calcium 8.7 (*)    GFR, Estimated 39 (*)    All other components within normal limits  PROTIME-INR - Abnormal; Notable for the following components:   Prothrombin Time 35.4 (*)    INR 3.6 (*)    All other components within normal limits  BRAIN NATRIURETIC PEPTIDE - Abnormal; Notable for the following components:   B Natriuretic Peptide 137.0 (*)    All other components within normal limits  TSH - Abnormal; Notable for the following components:   TSH 8.696 (*)    All other components within normal limits  CBG MONITORING, ED - Abnormal; Notable for the following components:   Glucose-Capillary 200 (*)    All other components within normal limits  CBC WITH DIFFERENTIAL/PLATELET  TSH  HEMOGLOBIN A1C  MAGNESIUM  BASIC METABOLIC PANEL  CBC  PROTIME-INR    EKG EKG Interpretation  Date/Time:  Friday November 11 2022  08:23:37 EST Ventricular Rate:  51 PR Interval:    QRS Duration: 105 QT Interval:  486 QTC Calculation: 448 R Axis:   112 Text Interpretation: Right and left arm electrode reversal, interpretation assumes no reversal Atrial flutter with predominant 4:1 AV block Ventricular premature complex Right axis deviation Low voltage, precordial leads Borderline repolarization abnormality Confirmed by Fredia Sorrow [phone]) on 11/11/2022 8:47:50 AM  Radiology US Carotid Bilateral  Result Date: 11/11/2022 CLINICAL DATA:  Syncope EXAM: BILATERAL CAROTID DUPLEX ULTRASOUND TECHNIQUE: Pearline Cables scale imaging, color Doppler and duplex ultrasound were performed of bilateral carotid and vertebral arteries in the neck. COMPARISON:  None Available. FINDINGS: Criteria: Quantification of carotid stenosis is based on velocity parameters that correlate the residual internal carotid diameter with NASCET-based stenosis levels, using the diameter of the distal internal carotid lumen as the denominator for stenosis measurement. The following velocity measurements were obtained: RIGHT ICA: 81/28 cm/sec CCA: [id number] cm/sec SYSTOLIC ICA/CCA RATIO:  1.4 ECA:  90 cm/sec LEFT ICA: 92/27 cm/sec CCA: [id number] cm/sec SYSTOLIC ICA/CCA RATIO:  1.3 ECA:  81 cm/sec RIGHT CAROTID ARTERY: Trace atherosclerotic plaque in the proximal internal carotid artery. By peak systolic velocity criteria, the estimated stenosis is less than 50%. RIGHT VERTEBRAL ARTERY:  Patent with normal antegrade flow. LEFT CAROTID ARTERY: Trace mild heterogeneous atherosclerotic plaque in the proximal internal carotid artery without evidence of stenosis. LEFT VERTEBRAL ARTERY:  Patent with normal antegrade flow. IMPRESSION: 1. Mild (1-49%) stenosis proximal right internal carotid artery secondary to trace heterogeneous atherosclerotic plaque. 2. No significant atherosclerotic plaque or evidence of stenosis in the left internal carotid artery. 3. Vertebral arteries are patent with  normal antegrade flow. Signed, Criselda Peaches, MD, [facility] Radiology Electronically Signed   By: Jacqulynn Cadet M.D.   On: 11/11/2022 16:35   DG Shoulder Right  Result Date: 11/11/2022 CLINICAL DATA:  Syncope.  Fall.  Right shoulder pain. EXAM: RIGHT SHOULDER - 2+ VIEW COMPARISON:  None Available. FINDINGS: Three views are submitted. The bones appear mildly demineralized. No evidence of acute fracture or dislocation. Mild acromioclavicular degenerative changes. Status post median sternotomy and cardiac dual valve replacement. IMPRESSION: No evidence of acute fracture or dislocation. Electronically Signed   By: Richardean Sale M.D.   On: 11/11/2022 13:06   DG Chest Port 1 View  Result Date: 11/11/2022 CLINICAL DATA:  Dizziness and weakness associated with nausea and vomiting as well as chronic right shoulder pain. Recent witnessed falls. EXAM: PORTABLE CHEST 1 VIEW COMPARISON:  Chest radiograph dated 10/10/2022 FINDINGS: Normal lung volumes. No focal consolidations. No pleural effusion or pneumothorax. Similar enlarged, postsurgical cardiomediastinal silhouette allowing for differences in technique. Median sternotomy wires are nondisplaced. IMPRESSION: 1. No acute cardiopulmonary process. 2. Similar enlarged, postsurgical cardiomediastinal silhouette. Electronically Signed   By: Darrin Nipper M.D.   On: 11/11/2022 09:53   CT Cervical Spine Wo Contrast  Result Date: 11/11/2022 CLINICAL DATA:  Head/neck trauma. Dizziness and weakness. Unremarkable. EXAM: CT HEAD WITHOUT CONTRAST CT CERVICAL SPINE WITHOUT CONTRAST TECHNIQUE: Multidetector CT imaging of the head and cervical spine was performed following the standard protocol without intravenous contrast. Multiplanar CT image reconstructions of the cervical spine were also generated. RADIATION DOSE REDUCTION: This exam was performed according to the departmental dose-optimization program which  includes automated exposure control, adjustment of the mA and/or kV according to patient size and/or use of iterative reconstruction technique. COMPARISON:  Head CT 03/16/2022.  Cervical spine CT 02/24/2022. FINDINGS: CT HEAD FINDINGS Brain: No acute intracranial hemorrhage. Unchanged encephalomalacia  in the right superior frontal gyrus, likely sequela of prior infarct or trauma. No hydrocephalus or extra-axial collection. Vascular: No hyperdense vessel or unexpected calcification. Skull: No calvarial fracture or suspicious bone lesion. Skull base is unremarkable. Sinuses/Orbits: Unremarkable. Other: No scalp hematoma. CT CERVICAL SPINE FINDINGS Alignment: Normal. Skull base and vertebrae: No acute fracture. Normal craniocervical junction. No suspicious bone lesions. Right-sided cervical rib like projection from the C7 vertebral body forming pseudarthrosis with the right first rib. Soft tissues and spinal canal: No prevertebral fluid or swelling. No visible canal hematoma. Atherosclerotic calcifications of the carotid bulbs. Disc levels: Central disc protrusions at C3-4 and C4-5. Small disc bulge at C5-6 results in no more than mild spinal canal stenosis. Upper chest: Unremarkable. Other: None. IMPRESSION: 1. No acute intracranial abnormality. Unchanged encephalomalacia in the right superior frontal gyrus, likely sequela of prior infarct or trauma. 2. No acute cervical spine fracture or traumatic listhesis. Electronically Signed   By: Emmit Alexanders M.D.   On: 11/11/2022 09:35   CT Head Wo Contrast  Result Date: 11/11/2022 CLINICAL DATA:  Head/neck trauma. Dizziness and weakness. Unremarkable. EXAM: CT HEAD WITHOUT CONTRAST CT CERVICAL SPINE WITHOUT CONTRAST TECHNIQUE: Multidetector CT imaging of the head and cervical spine was performed following the standard protocol without intravenous contrast. Multiplanar CT image reconstructions of the cervical spine were also generated. RADIATION DOSE REDUCTION: This exam  was performed according to the departmental dose-optimization program which includes automated exposure control, adjustment of the mA and/or kV according to patient size and/or use of iterative reconstruction technique. COMPARISON:  Head CT 03/16/2022.  Cervical spine CT 02/24/2022. FINDINGS: CT HEAD FINDINGS Brain: No acute intracranial hemorrhage. Unchanged encephalomalacia in the right superior frontal gyrus, likely sequela of prior infarct or trauma. No hydrocephalus or extra-axial collection. Vascular: No hyperdense vessel or unexpected calcification. Skull: No calvarial fracture or suspicious bone lesion. Skull base is unremarkable. Sinuses/Orbits: Unremarkable. Other: No scalp hematoma. CT CERVICAL SPINE FINDINGS Alignment: Normal. Skull base and vertebrae: No acute fracture. Normal craniocervical junction. No suspicious bone lesions. Right-sided cervical rib like projection from the C7 vertebral body forming pseudarthrosis with the right first rib. Soft tissues and spinal canal: No prevertebral fluid or swelling. No visible canal hematoma. Atherosclerotic calcifications of the carotid bulbs. Disc levels: Central disc protrusions at C3-4 and C4-5. Small disc bulge at C5-6 results in no more than mild spinal canal stenosis. Upper chest: Unremarkable. Other: None. IMPRESSION: 1. No acute intracranial abnormality. Unchanged encephalomalacia in the right superior frontal gyrus, likely sequela of prior infarct or trauma. 2. No acute cervical spine fracture or traumatic listhesis. Electronically Signed   By: Emmit Alexanders M.D.   On: 11/11/2022 09:35    Procedures Procedures    Medications Ordered in ED Medications  0.9 %  sodium chloride infusion (has no administration in time range)  HYDROcodone-acetaminophen (NORCO/VICODIN) 5-325 MG per tablet 1 tablet (has no administration in time range)  atorvastatin (LIPITOR) tablet 40 mg (has no administration in time range)  escitalopram (LEXAPRO) tablet 10 mg  (has no administration in time range)  levothyroxine (SYNTHROID) tablet 100 mcg (has no administration in time range)  pantoprazole (PROTONIX) EC tablet 40 mg (has no administration in time range)  gabapentin (NEURONTIN) capsule 100 mg (has no administration in time range)  budesonide (PULMICORT) nebulizer solution 0.25 mg (has no administration in time range)  loratadine (CLARITIN) tablet 10 mg (has no administration in time range)  fluticasone (FLONASE) 50 MCG/ACT nasal spray 1 spray (has no administration in  time range)  dextromethorphan-guaiFENesin (MUCINEX DM) 30-600 MG per 12 hr tablet 1 tablet (has no administration in time range)  acetaminophen (TYLENOL) tablet 650 mg (has no administration in time range)    Or  acetaminophen (TYLENOL) suppository 650 mg (has no administration in time range)  ondansetron (ZOFRAN) tablet 4 mg (has no administration in time range)    Or  ondansetron (ZOFRAN) injection 4 mg (has no administration in time range)  insulin aspart (novoLOG) injection 0-5 Units (has no administration in time range)  insulin aspart (novoLOG) injection 0-9 Units (has no administration in time range)  warfarin (COUMADIN) tablet 1 mg (has no administration in time range)  Warfarin - Pharmacist Dosing Inpatient (has no administration in time range)  albuterol (PROVENTIL) (2.5 MG/3ML) 0.083% nebulizer solution 2.5 mg (has no administration in time range)  ondansetron (ZOFRAN) injection 4 mg (4 mg Intravenous Given 11/11/22 0934)  sodium chloride 0.9 % bolus 500 mL (0 mLs Intravenous Stopped 11/11/22 1030)  fentaNYL (SUBLIMAZE) injection 25 mcg (25 mcg Intravenous Given 11/11/22 1109)    ED Course/ Medical Decision Making/ A&P                             Medical Decision Making Amount and/or Complexity of Data Reviewed Labs: ordered. Radiology: ordered.  Risk Prescription drug management. Decision regarding hospitalization.   CRITICAL CARE Performed by: Fredia Sorrow Total critical care time: 35 minutes Critical care time was exclusive of separately billable procedures and treating other patients. Critical care was necessary to treat or prevent imminent or life-threatening deterioration. Critical care was time spent personally by me on the following activities: development of treatment plan with patient and/or surrogate as well as nursing, discussions with consultants, evaluation of patient's response to treatment, examination of patient, obtaining history from patient or surrogate, ordering and performing treatments and interventions, ordering and review of laboratory studies, ordering and review of radiographic studies, pulse oximetry and re-evaluation of patient's condition.  Due to the fall got CT head decided get x-ray of her right shoulder as well.  Tray of the right arm without any acute bony abnormalities.  Patient's thyroid-stimulating hormone was 8.69.  Urinalysis negative patient's sugar was 200.  CBC white blood cell count 6.5 hemoglobin 13.7 platelets 166.  Complete metabolic panel sodium [id number] potassium 3.7 glucose 181 GFR 39 liver function test normal.  Anion gap normal INR elevated 3.6.  Will probably have to hold her Coumadin for that.  BN P was 137.  Chest x-ray also done no acute cardiopulmonary process.  CT cervical spine CT head without any acute injuries.  Cardiology contacted because of the new 4-1 block.  Figured it could be due to her diltiazem and her Lopressor.  Particular in the face of her renal function getting worse.  Seems to show that in January GFR was a bit worse and is even a little bit worse today.  This could be playing a role as well.  Cardiology saw the patient hospitalist will admit.  Patient certainly needs monitoring for the syncopal episodes and is possible that the atrial flutter with 4-1 block may be playing a role.  Not really sure what her blood pressures were when these events occurred.  Final Clinical  Impression(s) / ED Diagnoses Final diagnoses:  Syncope, unspecified syncope type  Atrial flutter, chronic (HCC)  Stage 3b chronic kidney disease ([place])    Rx / DC Orders ED Discharge Orders  None         Fredia Sorrow, MD 11/11/22 [phone]

## 2022-11-11 NOTE — Consult Note (Signed)
CARDIOLOGY CONSULT NOTE    Patient ID: [name]; [id number]; [birth year]   Admit date: 11/11/2022 Date of Consult: 11/11/2022  Primary Care Provider: [name], MD Primary Cardiologist: Dr. Carlyle Dolly Primary Electrophysiologist:     Patient Profile:   [name] is a 77 y.o. female who is being seen today for the evaluation of syncope at the request of Dr Manuella Ghazi.  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  Patient was in her usual state of health until this morning when she complained of right shoulder pain, felt dizzy while walking and almost was able to faint when her husband maneuvered her and laid her in the couch. Husband called EMS. She got up in a few minutes and tried to walk towards the EMS crew when she fell back on her head and lost consciousness for a few seconds to a minute. She regained consciousness but was not able to recall the details about her fainting spell. When EMS checked her pulse, HR was around 30s. Per EKG showed HR around 60 and atrial flutter with 4:1 AV block. Vitals remarkable for blood pressure ranging between 87-1 1 5 $ SBP.  Echo ordered and pending. Echocardiogram from 2023 showed normal functioning bioprosthetic mitral and aortic valves with normal LVEF.   Past Medical History:  Diagnosis Date   Anemia    Asthma    Atrial fibrillation ([place])    Breast carcinoma ([place]) 1995   [id number]   Cardioembolic stroke ([place]) [id number]   Right frontal in 01/2012; normal carotid ultrasound; possible LAA thrombus by TEE; virtual complete neurologic recovery   Chronic  kidney disease, stage 2, mildly decreased GFR    GFR of approximately 60   Depression    Diabetes mellitus without complication ([place])    Diverticulosis of colon (without mention of hemorrhage) 2012   Dr. Laural Golden   Fasting hyperglycemia    120 fasting   Gastroesophageal reflux disease    Gastroparesis    Hemorrhoids    Hyperlipidemia    Hypertension    pt denies 05/30/13     Dr Kathe Mariner   Hyponatremia    Rheumatic heart disease    a. 07/2013 Echo: Ef 55-60%, Mod AS/AI, Mod-Sev MS, sev dil LA, PASP 58;  b. 07/2013 TEE: EF 35-40%, mild-mod AS/AI, mod-sev MS, LA smoke;  c. 07/2013 Cath: elev R heart pressures, nl cors.   Shortness of breath     Past Surgical History:  Procedure Laterality Date   AORTIC VALVE REPLACEMENT N/A 07/18/2013   Procedure: AORTIC VALVE REPLACEMENT (AVR);  Surgeon: Gaye Pollack, MD;  Location: [facility];  Service: Open Heart Surgery;  Laterality: N/A;   BACK SURGERY     BREAST LUMPECTOMY Right 1995   CARDIAC CATHETERIZATION     CARDIOVERSION N/A 07/26/2013   Procedure: CARDIOVERSION;  Surgeon: Fay Records, MD;  Location: [facility];  Service: Cardiovascular;  Laterality: N/A;   COLONOSCOPY  2012   Negative screening procedure   DILATION AND CURETTAGE OF UTERUS     ESOPHAGEAL MANOMETRY N/A 06/17/2013   Procedure: ESOPHAGEAL MANOMETRY (EM);  Surgeon: Shanon Brow  Consuello Masse, MD;  Location: [facility];  Service: Endoscopy;  Laterality: N/A;   INTRAOPERATIVE TRANSESOPHAGEAL ECHOCARDIOGRAM N/A 07/18/2013   Procedure: INTRAOPERATIVE TRANSESOPHAGEAL ECHOCARDIOGRAM;  Surgeon: Gaye Pollack, MD;  Location: [facility];  Service: Open Heart Surgery;  Laterality: N/A;   LEFT AND RIGHT HEART CATHETERIZATION WITH CORONARY ANGIOGRAM N/A 07/10/2013   Procedure: LEFT AND RIGHT HEART CATHETERIZATION WITH CORONARY ANGIOGRAM;  Surgeon: Burnell Blanks, MD;  Location: [facility] CATH LAB;  Service: Cardiovascular;  Laterality: N/A;   LUMBAR LAMINECTOMY/DECOMPRESSION MICRODISCECTOMY  Right 05/13/2014   Procedure: LUMBAR LAMINECTOMY/DECOMPRESSION MICRODISCECTOMY 1 LEVEL  lumbar four/five;  Surgeon: Faythe Ghee, MD;  Location: [facility];  Service: Neurosurgery;  Laterality: Right;   MITRAL VALVE REPLACEMENT N/A 07/18/2013   Procedure: MITRAL VALVE (MV) REPLACEMENT;  Surgeon: Gaye Pollack, MD;  Location: [facility];  Service: Open Heart Surgery;  Laterality: N/A;   MITRAL VALVE [place], closed mitral valvulotomy by finger fracture   TEE WITHOUT CARDIOVERSION  01/24/2012   Procedure: TRANSESOPHAGEAL ECHOCARDIOGRAM (TEE);  Surgeon: Lelon Perla, MD;  Location[facility] ENDOSCOPY;  Service: Cardiovascular;  Laterality: N/A;   TEE WITHOUT CARDIOVERSION N/A 07/11/2013   Procedure: TRANSESOPHAGEAL ECHOCARDIOGRAM (TEE);  Surgeon: Thayer Headings, MD;  Location: [facility];  Service: Cardiovascular;  Laterality: N/A;   TEE WITHOUT CARDIOVERSION N/A 07/26/2013   Procedure: TRANSESOPHAGEAL ECHOCARDIOGRAM (TEE);  Surgeon: Fay Records, MD;  Location: [facility];  Service: Cardiovascular;  Laterality: N/A;   TUBAL LIGATION  1973       Inpatient Medications: Scheduled Meds:  [START ON 11/12/2022] atorvastatin  40 mg Oral Daily   budesonide  0.25 mg Nebulization BID   [START ON 11/12/2022] escitalopram  10 mg Oral Daily   [START ON 11/12/2022] fluticasone  1 spray Each Nare Daily   gabapentin  100 mg Oral QID   insulin aspart  0-5 Units Subcutaneous QHS   insulin aspart  0-9 Units Subcutaneous TID WC   [START ON 11/12/2022] levothyroxine  100 mcg Oral QAC breakfast   [START ON 11/12/2022] loratadine  10 mg Oral Daily   [START ON 11/12/2022] pantoprazole  40 mg Oral QAC breakfast   warfarin  1 mg Oral ONCE-1600   Warfarin - Pharmacist Dosing Inpatient   Does not apply q1600   Continuous Infusions:  [START ON 11/12/2022] sodium chloride     PRN Meds: acetaminophen **OR** acetaminophen, albuterol, dextromethorphan-guaiFENesin, HYDROcodone-acetaminophen, ondansetron  **OR** ondansetron (ZOFRAN) IV  Allergies:    Allergies  Allergen Reactions   Amiodarone Rash    Social History:   Social History   Socioeconomic History   Marital status: Married    Spouse name: Not on file   Number of children: 1   Years of education: Not on file   Highest education level: Not on file  Occupational History   Occupation: Retired Product manager: RETIRED  Tobacco Use   Smoking status: Never   Smokeless tobacco: Never  Vaping Use   Vaping Use: Never used  Substance and Sexual Activity   Alcohol use: No    Alcohol/week: 0.0 standard drinks of alcohol   Drug use: No   Sexual activity: Not Currently  Other Topics Concern   Not on file  Social History Narrative   Married with children   No regular exercise   Social Determinants of Health   Financial Resource Strain: Not on file  Food Insecurity: Not on file  Transportation Needs: Not on file  Physical  Activity: Not on file  Stress: Not on file  Social Connections: Not on file  Intimate Partner Violence: Not on file    Family History:    Family History  Problem Relation Age of Onset   Heart disease Brother 20       MI   Rheum arthritis Maternal Grandmother    Asthma Maternal Grandfather    Hypothyroidism Mother    Diabetes Sister    Cirrhosis Father      ROS:  Please see the history of present illness.  ROS  All other ROS reviewed and negative.     Physical Exam/Data:   Vitals:   11/11/22 1300 11/11/22 1330 11/11/22 1400 11/11/22 1430  BP: 115/66 (!) 87/66  111/75  Pulse: 62 65 (!) 58 (!) 51  Resp: 12 (!) 25 17 12  $ Temp:      TempSrc:      SpO2: (!) 85% 95% 91% 90%  Weight:      Height:       No intake or output data in the 24 hours ending 11/11/22 1716 Filed Weights   11/11/22 0814  Weight: 72.6 kg   Body mass index is 29.26 kg/m.  General:  Well nourished, well developed, in no acute distress HEENT: normal Lymph: no adenopathy Neck: no JVD Endocrine:  No  thryomegaly Vascular: No carotid bruits; FA pulses 2+ bilaterally without bruits  Cardiac:  normal S1, S2; RRR; no murmur  Lungs:  clear to auscultation bilaterally, no wheezing, rhonchi or rales  Abd: soft, nontender, no hepatomegaly  Ext: no edema Musculoskeletal:  No deformities, BUE and BLE strength normal and equal Skin: warm and dry  Neuro:  CNs 2-12 intact, no focal abnormalities noted Psych:  Normal affect   EKG:  The EKG was personally reviewed and demonstrates:   Telemetry:  Telemetry was personally reviewed and demonstrates:    Relevant CV Studies:   Laboratory Data:  Chemistry Recent Labs  Lab 11/11/22 0835  NA 134*  K 3.7  CL 94*  CO2 26  GLUCOSE 181*  BUN 28*  CREATININE 1.39*  CALCIUM 8.7*  GFRNONAA 39*  ANIONGAP 14    Recent Labs  Lab 11/11/22 0835  PROT 7.0  ALBUMIN 4.0  AST 25  ALT 22  ALKPHOS 52  BILITOT 0.7   Hematology Recent Labs  Lab 11/11/22 0835  WBC 6.5  RBC 4.49  HGB 13.7  HCT 41.5  MCV 92.4  MCH 30.5  MCHC 33.0  RDW 14.5  PLT 166   Cardiac EnzymesNo results for input(s): "TROPONINI" in the last 168 hours. No results for input(s): "TROPIPOC" in the last 168 hours.  BNP Recent Labs  Lab 11/11/22 0835  BNP 137.0*    DDimer No results for input(s): "DDIMER" in the last 168 hours.  Radiology/Studies:  US Carotid Bilateral  Result Date: 11/11/2022 CLINICAL DATA:  Syncope EXAM: BILATERAL CAROTID DUPLEX ULTRASOUND TECHNIQUE: Pearline Cables scale imaging, color Doppler and duplex ultrasound were performed of bilateral carotid and vertebral arteries in the neck. COMPARISON:  None Available. FINDINGS: Criteria: Quantification of carotid stenosis is based on velocity parameters that correlate the residual internal carotid diameter with NASCET-based stenosis levels, using the diameter of the distal internal carotid lumen as the denominator for stenosis measurement. The following velocity measurements were obtained: RIGHT ICA: 81/28 cm/sec CCA:  99991111 cm/sec SYSTOLIC ICA/CCA RATIO:  1.4 ECA:  90 cm/sec LEFT ICA: 92/27 cm/sec CCA: [id number] cm/sec SYSTOLIC ICA/CCA RATIO:  1.3 ECA:  81 cm/sec RIGHT CAROTID  ARTERY: Trace atherosclerotic plaque in the proximal internal carotid artery. By peak systolic velocity criteria, the estimated stenosis is less than 50%. RIGHT VERTEBRAL ARTERY:  Patent with normal antegrade flow. LEFT CAROTID ARTERY: Trace mild heterogeneous atherosclerotic plaque in the proximal internal carotid artery without evidence of stenosis. LEFT VERTEBRAL ARTERY:  Patent with normal antegrade flow. IMPRESSION: 1. Mild (1-49%) stenosis proximal right internal carotid artery secondary to trace heterogeneous atherosclerotic plaque. 2. No significant atherosclerotic plaque or evidence of stenosis in the left internal carotid artery. 3. Vertebral arteries are patent with normal antegrade flow. Signed, Criselda Peaches, MD, [facility] Radiology Electronically Signed   By: Jacqulynn Cadet M.D.   On: 11/11/2022 16:35   DG Shoulder Right  Result Date: 11/11/2022 CLINICAL DATA:  Syncope.  Fall.  Right shoulder pain. EXAM: RIGHT SHOULDER - 2+ VIEW COMPARISON:  None Available. FINDINGS: Three views are submitted. The bones appear mildly demineralized. No evidence of acute fracture or dislocation. Mild acromioclavicular degenerative changes. Status post median sternotomy and cardiac dual valve replacement. IMPRESSION: No evidence of acute fracture or dislocation. Electronically Signed   By: Richardean Sale M.D.   On: 11/11/2022 13:06   DG Chest Port 1 View  Result Date: 11/11/2022 CLINICAL DATA:  Dizziness and weakness associated with nausea and vomiting as well as chronic right shoulder pain. Recent witnessed falls. EXAM: PORTABLE CHEST 1 VIEW COMPARISON:  Chest radiograph dated 10/10/2022 FINDINGS: Normal lung volumes. No focal consolidations. No pleural effusion or pneumothorax. Similar  enlarged, postsurgical cardiomediastinal silhouette allowing for differences in technique. Median sternotomy wires are nondisplaced. IMPRESSION: 1. No acute cardiopulmonary process. 2. Similar enlarged, postsurgical cardiomediastinal silhouette. Electronically Signed   By: Darrin Nipper M.D.   On: 11/11/2022 09:53   CT Cervical Spine Wo Contrast  Result Date: 11/11/2022 CLINICAL DATA:  Head/neck trauma. Dizziness and weakness. Unremarkable. EXAM: CT HEAD WITHOUT CONTRAST CT CERVICAL SPINE WITHOUT CONTRAST TECHNIQUE: Multidetector CT imaging of the head and cervical spine was performed following the standard protocol without intravenous contrast. Multiplanar CT image reconstructions of the cervical spine were also generated. RADIATION DOSE REDUCTION: This exam was performed according to the departmental dose-optimization program which includes automated exposure control, adjustment of the mA and/or kV according to patient size and/or use of iterative reconstruction technique. COMPARISON:  Head CT 03/16/2022.  Cervical spine CT 02/24/2022. FINDINGS: CT HEAD FINDINGS Brain: No acute intracranial hemorrhage. Unchanged encephalomalacia in the right superior frontal gyrus, likely sequela of prior infarct or trauma. No hydrocephalus or extra-axial collection. Vascular: No hyperdense vessel or unexpected calcification. Skull: No calvarial fracture or suspicious bone lesion. Skull base is unremarkable. Sinuses/Orbits: Unremarkable. Other: No scalp hematoma. CT CERVICAL SPINE FINDINGS Alignment: Normal. Skull base and vertebrae: No acute fracture. Normal craniocervical junction. No suspicious bone lesions. Right-sided cervical rib like projection from the C7 vertebral body forming pseudarthrosis with the right first rib. Soft tissues and spinal canal: No prevertebral fluid or swelling. No visible canal hematoma. Atherosclerotic calcifications of the carotid bulbs. Disc levels: Central disc protrusions at C3-4 and C4-5. Small  disc bulge at C5-6 results in no more than mild spinal canal stenosis. Upper chest: Unremarkable. Other: None. IMPRESSION: 1. No acute intracranial abnormality. Unchanged encephalomalacia in the right superior frontal gyrus, likely sequela of prior infarct or trauma. 2. No acute cervical spine fracture or traumatic listhesis. Electronically Signed   By: Emmit Alexanders M.D.   On: 11/11/2022 09:35   CT Head Wo Contrast  Result Date:  11/11/2022 CLINICAL DATA:  Head/neck trauma. Dizziness and weakness. Unremarkable. EXAM: CT HEAD WITHOUT CONTRAST CT CERVICAL SPINE WITHOUT CONTRAST TECHNIQUE: Multidetector CT imaging of the head and cervical spine was performed following the standard protocol without intravenous contrast. Multiplanar CT image reconstructions of the cervical spine were also generated. RADIATION DOSE REDUCTION: This exam was performed according to the departmental dose-optimization program which includes automated exposure control, adjustment of the mA and/or kV according to patient size and/or use of iterative reconstruction technique. COMPARISON:  Head CT 03/16/2022.  Cervical spine CT 02/24/2022. FINDINGS: CT HEAD FINDINGS Brain: No acute intracranial hemorrhage. Unchanged encephalomalacia in the right superior frontal gyrus, likely sequela of prior infarct or trauma. No hydrocephalus or extra-axial collection. Vascular: No hyperdense vessel or unexpected calcification. Skull: No calvarial fracture or suspicious bone lesion. Skull base is unremarkable. Sinuses/Orbits: Unremarkable. Other: No scalp hematoma. CT CERVICAL SPINE FINDINGS Alignment: Normal. Skull base and vertebrae: No acute fracture. Normal craniocervical junction. No suspicious bone lesions. Right-sided cervical rib like projection from the C7 vertebral body forming pseudarthrosis with the right first rib. Soft tissues and spinal canal: No prevertebral fluid or swelling. No visible canal hematoma. Atherosclerotic calcifications of the  carotid bulbs. Disc levels: Central disc protrusions at C3-4 and C4-5. Small disc bulge at C5-6 results in no more than mild spinal canal stenosis. Upper chest: Unremarkable. Other: None. IMPRESSION: 1. No acute intracranial abnormality. Unchanged encephalomalacia in the right superior frontal gyrus, likely sequela of prior infarct or trauma. 2. No acute cervical spine fracture or traumatic listhesis. Electronically Signed   By: Emmit Alexanders M.D.   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  # Syncope # Chronic atrial flutter with new onset of slow ventricular response (EKG showed atrial flutter with 4:1 AV block) -Patient has chronic atrial flutter on AV nodal agents, metoprolol and diltiazem. EKG today showed atrial flutter with 4:1 AV block, HR 60s (new compared to prior EKG). It is possible patient had syncope from atrial flutter with slow ventricular response. Will also obtain orthostatic vitals. Hold AV nodal agents and continue Coumadin. She will benefit from transferring to [facility] in [place] for EP evaluation. -Obtain 2D echocardiogram  # Rheumatic mitral valve disease with moderate MS s/p closed mitral valve valvulotomy in 1962, s/p bioprosthetic MVR in 2014, rheumatic aortic valve disease with moderate aortic valve stenosis and regurgitation s/p bioprosthetic AVR in 2014 (25 mm Edwards magna-ease pericardial valve, MVR and 21 mm Edwards magna-ease pericardial valve, AVR) -Obtain 2D echocardiogram -Continue therapeutic Coumadin (patient has been on Coumadin for chronic  atrial flutter and rheumatic mitral valve stenosis in the past, now that she has MVR, it is okay to switch to Eliquis but due to prior history of CVA, recommend to continue Coumadin).  I have spent a total of 60 minutes with patient reviewing chart , telemetry, EKGs, labs and examining patient as well as establishing an assessment and plan that was discussed with the patient.  > 50% of time was spent in direct patient care.     For questions or updates, please contact [place] Please consult  [email or web address] for contact info under Cardiology/STEMI.   Signed, Vangie Bicker, MD 11/11/2022 5:16 PM

## 2022-11-11 NOTE — ED Notes (Signed)
Pt family reports 2 syncopal episodes this morning. Both witnessed, one with family and one with EMS. Pt completely fell with one syncopal episode but does not report pain or suspected injury from it.

## 2022-11-11 NOTE — H&P (Addendum)
History and Physical    [name] [id number] DOB: 01/21/1946 DOA: 11/11/2022  PCP: [name], MD   Patient coming from: Home  Chief Complaint: Syncope  HPI: [name] is a 77 y.o. female with medical history significant for asthma/COPD, atrial fibrillation on Coumadin, prior CVA, prior aortic and mitral valve replacement, hypothyroidism, and type 2 diabetes who presented to the ED after 2 syncopal episodes this morning, both of which were witnessed.  There appears to be no injury related to this and she has had some associated symptoms of dizziness and weakness with some nausea and vomiting.  She has some chronic right shoulder pain with history of rotator cuff pain.  EMS was called and patient was brought to the ED for further evaluation.  Husband at bedside states that she has had poor appetite recently and has not been eating or drinking as much as usual.   ED Course: Vital signs with some initial bradycardia in the 50-60 bpm range and EKG showing atrial flutter with 4:1 block and cardiology consulted to see patient in ED.  Creatinine 1.39 and elevated from usual baseline of 0.8-1.1.  CT head and neck with no acute findings and chest x-ray and shoulder films with no acute findings.  Review of Systems: Reviewed as noted above, otherwise negative.  Past Medical History:  Diagnosis Date   Anemia    Asthma    Atrial fibrillation ([place])    Breast carcinoma ([place]) 1995   [id number]   Cardioembolic stroke ([place]) [id number]   Right frontal in 01/2012; normal carotid ultrasound; possible LAA thrombus by TEE; virtual complete neurologic recovery   Chronic kidney disease, stage 2, mildly decreased GFR    GFR of approximately 60   Depression    Diabetes mellitus without complication ([place])    Diverticulosis of colon (without mention of hemorrhage) 2012   Dr. Laural Golden   Fasting hyperglycemia    120 fasting   Gastroesophageal reflux disease    Gastroparesis    Hemorrhoids    Hyperlipidemia     Hypertension    pt denies 05/30/13     Dr Kathe Mariner   Hyponatremia    Rheumatic heart disease    a. 07/2013 Echo: Ef 55-60%, Mod AS/AI, Mod-Sev MS, sev dil LA, PASP 58;  b. 07/2013 TEE: EF 35-40%, mild-mod AS/AI, mod-sev MS, LA smoke;  c. 07/2013 Cath: elev R heart pressures, nl cors.   Shortness of breath     Past Surgical History:  Procedure Laterality Date   AORTIC VALVE REPLACEMENT N/A 07/18/2013   Procedure: AORTIC VALVE REPLACEMENT (AVR);  Surgeon: Gaye Pollack, MD;  Location: [place];  Service: Open Heart Surgery;  Laterality: N/A;   BACK SURGERY     BREAST LUMPECTOMY Right 1995   CARDIAC CATHETERIZATION     CARDIOVERSION N/A 07/26/2013   Procedure: CARDIOVERSION;  Surgeon: Fay Records, MD;  Location: [place];  Service: Cardiovascular;  Laterality: N/A;   COLONOSCOPY  2012   Negative screening procedure   DILATION AND CURETTAGE OF UTERUS     ESOPHAGEAL MANOMETRY N/A 06/17/2013   Procedure: ESOPHAGEAL MANOMETRY (EM);  Surgeon: Sable Feil, MD;  Location: [facility];  Service: Endoscopy;  Laterality: N/A;   INTRAOPERATIVE TRANSESOPHAGEAL ECHOCARDIOGRAM N/A 07/18/2013   Procedure: INTRAOPERATIVE TRANSESOPHAGEAL ECHOCARDIOGRAM;  Surgeon: Gaye Pollack, MD;  Location: [place] [facility];  Service: Open Heart Surgery;  Laterality: N/A;   LEFT AND RIGHT HEART CATHETERIZATION WITH CORONARY ANGIOGRAM N/A 07/10/2013   Procedure: LEFT  AND RIGHT HEART CATHETERIZATION WITH CORONARY ANGIOGRAM;  Surgeon: Burnell Blanks, MD;  Location: [facility] CATH LAB;  Service: Cardiovascular;  Laterality: N/A;   LUMBAR LAMINECTOMY/DECOMPRESSION MICRODISCECTOMY Right 05/13/2014   Procedure: LUMBAR LAMINECTOMY/DECOMPRESSION MICRODISCECTOMY 1 LEVEL  lumbar four/five;  Surgeon: Faythe Ghee, MD;  Location: [facility];  Service: Neurosurgery;  Laterality: Right;   MITRAL VALVE REPLACEMENT N/A 07/18/2013   Procedure: MITRAL VALVE (MV) REPLACEMENT;  Surgeon: Gaye Pollack, MD;  Location: [place] OR;   Service: Open Heart Surgery;  Laterality: N/A;   MITRAL VALVE [place], closed mitral valvulotomy by finger fracture   TEE WITHOUT CARDIOVERSION  01/24/2012   Procedure: TRANSESOPHAGEAL ECHOCARDIOGRAM (TEE);  Surgeon: Lelon Perla, MD;  Location: [facility] ENDOSCOPY;  Service: Cardiovascular;  Laterality: N/A;   TEE WITHOUT CARDIOVERSION N/A 07/11/2013   Procedure: TRANSESOPHAGEAL ECHOCARDIOGRAM (TEE);  Surgeon: Thayer Headings, MD;  Location: [place];  Service: Cardiovascular;  Laterality: N/A;   TEE WITHOUT CARDIOVERSION N/A 07/26/2013   Procedure: TRANSESOPHAGEAL ECHOCARDIOGRAM (TEE);  Surgeon: Fay Records, MD;  Location[facility];  Service: Cardiovascular;  Laterality: N/A;   [place]     reports that she has never smoked. She has never used smokeless tobacco. She reports that she does not drink alcohol and does not use drugs.  Allergies  Allergen Reactions   Amiodarone Rash    Family History  Problem Relation Age of Onset   Heart disease Brother 33       MI   Rheum arthritis Maternal Grandmother    Asthma Maternal Grandfather    Hypothyroidism Mother    Diabetes Sister    Cirrhosis Father     Prior to Admission medications   Medication Sig Start Date End Date Taking? Authorizing Provider  albuterol (PROVENTIL) (2.5 MG/3ML) 0.083% nebulizer solution Take 3 mLs (2.5 mg total) by nebulization every 4 (four) hours as needed for wheezing or shortness of breath. Patient taking differently: Take 2.5 mg by nebulization 2 (two) times daily as needed for wheezing or shortness of breath. 07/22/21  Yes Tanda Rockers, MD  albuterol (VENTOLIN HFA) 108 (90 Base) MCG/ACT inhaler Inhale 1-2 puffs into the lungs every 6 (six) hours as needed for wheezing or shortness of breath. 08/08/22  Yes [provider]  atorvastatin (LIPITOR) 40 MG tablet TAKE ONE TABLET BY MOUTH ONCE DAILY. Patient taking differently: Take 40 mg by mouth daily. 07/18/22  Yes  Branch, Alphonse Guild, MD  budesonide (PULMICORT) 0.25 MG/2ML nebulizer solution One 0.25 mg  twice daily  along with albuterol Patient taking differently: Take 0.25 mg by nebulization 2 (two) times daily as needed (wheezing, shortness of breath). One 0.25 mg  twice daily  along with albuterol 12/13/21  Yes Tanda Rockers, MD  budesonide (PULMICORT) 0.5 MG/2ML nebulizer solution USE 1/2 VIAL IN NEBULIZER TWICE DAILY. DISCARD UNUSED PORTION AFTER EACH USE. Patient taking differently: Take 0.25 mg by nebulization 2 (two) times daily. USE 1/2 VIAL IN NEBULIZER TWICE DAILY. DISCARD UNUSED PORTION AFTER EACH USE. 10/12/22  Yes Tanda Rockers, MD  cetirizine (ZYRTEC) 10 MG tablet Take 10 mg by mouth daily.   Yes [provider]  dextromethorphan-guaiFENesin (MUCINEX DM) 30-600 MG 12hr tablet Take 1 tablet by mouth 2 (two) times daily as needed for cough.   Yes [provider]  diltiazem (CARDIZEM CD) 180 MG 24 hr capsule Take 1 capsule (180 mg total) by mouth daily. 08/03/13  Yes Collins, Arlyn Leak,  PA-C  escitalopram (LEXAPRO) 10 MG tablet Take 10 mg by mouth daily.  04/10/15  Yes [provider]  famotidine (PEPCID) 20 MG tablet TAKE 1 TABLET AFTER SUPPER Patient taking differently: Take 20 mg by mouth every evening. TAKE 1 TABLET AFTER SUPPER 07/18/22  Yes Tanda Rockers, MD  fluticasone [facility]) 50 MCG/ACT nasal spray Place 1 spray into both nostrils daily. 09/05/13  Yes BranchAlphonse Guild, MD  gabapentin (NEURONTIN) 100 MG capsule TAKE 1 CAPSULE BY MOUTH FOUR TIMES DAILY. Patient taking differently: Take 100 mg by mouth 4 (four) times daily. 08/09/22  Yes Tanda Rockers, MD  HYDROcodone-acetaminophen (NORCO/VICODIN) 5-325 MG tablet Take 1 tablet by mouth every 6 (six) hours as needed for moderate pain or severe pain. 11/09/22  Yes [provider]  levothyroxine (SYNTHROID) 100 MCG tablet Take 100 mcg by mouth daily before breakfast.   Yes [provider]  metFORMIN  (GLUCOPHAGE) 500 MG tablet Take 1 tablet (500 mg total) by mouth 2 (two) times daily with a meal. 08/03/13  Yes Collins, Gina L, PA-C  metoprolol tartrate (LOPRESSOR) 25 MG tablet Take 1.5 tablets (37.5 mg total) by mouth 2 (two) times daily. 11/07/22  Yes BranchAlphonse Guild, MD  Multiple Vitamin (MULITIVITAMIN WITH MINERALS) TABS Take 1 tablet by mouth daily.   Yes [provider]  olmesartan (BENICAR) 5 MG tablet Take 5 mg by mouth daily.   Yes [provider]  pantoprazole (PROTONIX) 40 MG tablet TAKE 1 TABLET 30 TO 60 MINUTES BEFORE FIRST MEAL OF THE DAY. Patient taking differently: Take 40 mg by mouth daily before breakfast. 04/12/22  Yes Tanda Rockers, MD  potassium chloride SA (KLOR-CON M) 20 MEQ tablet Take 1 tablet (20 mEq total) by mouth 2 (two) times daily. 04/03/22 05/05/79 Yes Matricia Begnaud D, DO  tiZANidine (ZANAFLEX) 2 MG tablet Take 2 mg by mouth 2 (two) times daily as needed for muscle spasms. 11/07/22  Yes [provider]  torsemide (DEMADEX) 20 MG tablet Take 2 tablets (40 mg total) by mouth every morning AND 1 tablet (20 mg total) every evening. (May take an extra 33m as needed for swelling or shortness of breath).. 10/10/22  Yes BArnoldo Lenis MD  warfarin (COUMADIN) 2.5 MG tablet TAKE AS DIRECTED BY COUMADIN CLINIC. Patient taking differently: Take 2.5-3.75 mg by mouth See admin instructions. Take 2.514mby mouth daily except on Sunday and Thursday. On Sunday and Thursday, take 3.7546my mouth. 03/09/22  Yes BraArnoldo LenisD    Physical Exam: Vitals:   11/11/22 1100 11/11/22 1200 11/11/22 1205 11/11/22 1330  BP: 106/61 106/80  (!) 87/66  Pulse: 98 62  65  Resp: 10 10  (!) 25  Temp:   97.9 F (36.6 C)   TempSrc:   Oral   SpO2: 95% 94%  95%  Weight:      Height:        Constitutional: NAD, calm, comfortable Vitals:   11/11/22 1100 11/11/22 1200 11/11/22 1205 11/11/22 1330  BP: 106/61 106/80  (!) 87/66  Pulse: 98 62  65  Resp: 10 10  (!) 25   Temp:   97.9 F (36.6 C)   TempSrc:   Oral   SpO2: 95% 94%  95%  Weight:      Height:       Eyes: lids and conjunctivae normal Neck: normal, supple Respiratory: clear to auscultation bilaterally. Normal respiratory effort. No accessory muscle use.  Cardiovascular: Regular rate and rhythm,  no murmurs. Abdomen: no tenderness, no distention. Bowel sounds positive.  Musculoskeletal:  No edema. Skin: no rashes, lesions, ulcers.  Psychiatric: Flat affect  Labs on Admission: I have personally reviewed following labs and imaging studies  CBC: Recent Labs  Lab 11/11/22 0835  WBC 6.5  NEUTROABS 3.8  HGB 13.7  HCT 41.5  MCV 92.4  PLT [id number]   Basic Metabolic Panel: Recent Labs  Lab 11/11/22 0835  NA 134*  K 3.7  CL 94*  CO2 26  GLUCOSE 181*  BUN 28*  CREATININE 1.39*  CALCIUM 8.7*   GFR: Estimated Creatinine Clearance: 31.1 mL/min (A) (by C-G formula based on SCr of 1.39 mg/dL (H)). Liver Function Tests: Recent Labs  Lab 11/11/22 0835  AST 25  ALT 22  ALKPHOS 52  BILITOT 0.7  PROT 7.0  ALBUMIN 4.0   No results for input(s): "LIPASE", "AMYLASE" in the last 168 hours. No results for input(s): "AMMONIA" in the last 168 hours. Coagulation Profile: Recent Labs  Lab 11/11/22 0835  INR 3.6*   Cardiac Enzymes: No results for input(s): "CKTOTAL", "CKMB", "CKMBINDEX", "TROPONINI" in the last 168 hours. BNP (last 3 results) No results for input(s): "PROBNP" in the last 8760 hours. HbA1C: No results for input(s): "HGBA1C" in the last 72 hours. CBG: Recent Labs  Lab 11/11/22 0846  GLUCAP 200*   Lipid Profile: No results for input(s): "CHOL", "HDL", "LDLCALC", "TRIG", "CHOLHDL", "LDLDIRECT" in the last 72 hours. Thyroid Function Tests: No results for input(s): "TSH", "T4TOTAL", "FREET4", "T3FREE", "THYROIDAB" in the last 72 hours. Anemia Panel: No results for input(s): "VITAMINB12", "FOLATE", "FERRITIN", "TIBC", "IRON", "RETICCTPCT" in the last 72 hours. Urine  analysis:    Component Value Date/Time   COLORURINE YELLOW 11/11/2022 1005   APPEARANCEUR HAZY (A) 11/11/2022 1005   LABSPEC 1.010 11/11/2022 1005   PHURINE 6.0 11/11/2022 1005   GLUCOSEU NEGATIVE 11/11/2022 1005   HGBUR NEGATIVE 11/11/2022 1005   BILIRUBINUR NEGATIVE 11/11/2022 1005   KETONESUR NEGATIVE 11/11/2022 1005   PROTEINUR NEGATIVE 11/11/2022 1005   UROBILINOGEN 0.2 07/07/2014 1935   NITRITE NEGATIVE 11/11/2022 1005   LEUKOCYTESUR LARGE (A) 11/11/2022 1005    Radiological Exams on Admission: DG Shoulder Right  Result Date: 11/11/2022 CLINICAL DATA:  Syncope.  Fall.  Right shoulder pain. EXAM: RIGHT SHOULDER - 2+ VIEW COMPARISON:  None Available. FINDINGS: Three views are submitted. The bones appear mildly demineralized. No evidence of acute fracture or dislocation. Mild acromioclavicular degenerative changes. Status post median sternotomy and cardiac dual valve replacement. IMPRESSION: No evidence of acute fracture or dislocation. Electronically Signed   By: Richardean Sale M.D.   On: 11/11/2022 13:06   DG Chest Port 1 View  Result Date: 11/11/2022 CLINICAL DATA:  Dizziness and weakness associated with nausea and vomiting as well as chronic right shoulder pain. Recent witnessed falls. EXAM: PORTABLE CHEST 1 VIEW COMPARISON:  Chest radiograph dated 10/10/2022 FINDINGS: Normal lung volumes. No focal consolidations. No pleural effusion or pneumothorax. Similar enlarged, postsurgical cardiomediastinal silhouette allowing for differences in technique. Median sternotomy wires are nondisplaced. IMPRESSION: 1. No acute cardiopulmonary process. 2. Similar enlarged, postsurgical cardiomediastinal silhouette. Electronically Signed   By: Darrin Nipper M.D.   On: 11/11/2022 09:53   CT Cervical Spine Wo Contrast  Result Date: 11/11/2022 CLINICAL DATA:  Head/neck trauma. Dizziness and weakness. Unremarkable. EXAM: CT HEAD WITHOUT CONTRAST CT CERVICAL SPINE WITHOUT CONTRAST TECHNIQUE: Multidetector CT  imaging of the head and cervical spine was performed following the standard protocol without intravenous contrast. Multiplanar CT  image reconstructions of the cervical spine were also generated. RADIATION DOSE REDUCTION: This exam was performed according to the departmental dose-optimization program which includes automated exposure control, adjustment of the mA and/or kV according to patient size and/or use of iterative reconstruction technique. COMPARISON:  Head CT 03/16/2022.  Cervical spine CT 02/24/2022. FINDINGS: CT HEAD FINDINGS Brain: No acute intracranial hemorrhage. Unchanged encephalomalacia in the right superior frontal gyrus, likely sequela of prior infarct or trauma. No hydrocephalus or extra-axial collection. Vascular: No hyperdense vessel or unexpected calcification. Skull: No calvarial fracture or suspicious bone lesion. Skull base is unremarkable. Sinuses/Orbits: Unremarkable. Other: No scalp hematoma. CT CERVICAL SPINE FINDINGS Alignment: Normal. Skull base and vertebrae: No acute fracture. Normal craniocervical junction. No suspicious bone lesions. Right-sided cervical rib like projection from the C7 vertebral body forming pseudarthrosis with the right first rib. Soft tissues and spinal canal: No prevertebral fluid or swelling. No visible canal hematoma. Atherosclerotic calcifications of the carotid bulbs. Disc levels: Central disc protrusions at C3-4 and C4-5. Small disc bulge at C5-6 results in no more than mild spinal canal stenosis. Upper chest: Unremarkable. Other: None. IMPRESSION: 1. No acute intracranial abnormality. Unchanged encephalomalacia in the right superior frontal gyrus, likely sequela of prior infarct or trauma. 2. No acute cervical spine fracture or traumatic listhesis. Electronically Signed   By: Emmit Alexanders M.D.   On: 11/11/2022 09:35   CT Head Wo Contrast  Result Date: 11/11/2022 CLINICAL DATA:  Head/neck trauma. Dizziness and weakness. Unremarkable. EXAM: CT HEAD  WITHOUT CONTRAST CT CERVICAL SPINE WITHOUT CONTRAST TECHNIQUE: Multidetector CT imaging of the head and cervical spine was performed following the standard protocol without intravenous contrast. Multiplanar CT image reconstructions of the cervical spine were also generated. RADIATION DOSE REDUCTION: This exam was performed according to the departmental dose-optimization program which includes automated exposure control, adjustment of the mA and/or kV according to patient size and/or use of iterative reconstruction technique. COMPARISON:  Head CT 03/16/2022.  Cervical spine CT 02/24/2022. FINDINGS: CT HEAD FINDINGS Brain: No acute intracranial hemorrhage. Unchanged encephalomalacia in the right superior frontal gyrus, likely sequela of prior infarct or trauma. No hydrocephalus or extra-axial collection. Vascular: No hyperdense vessel or unexpected calcification. Skull: No calvarial fracture or suspicious bone lesion. Skull base is unremarkable. Sinuses/Orbits: Unremarkable. Other: No scalp hematoma. CT CERVICAL SPINE FINDINGS Alignment: Normal. Skull base and vertebrae: No acute fracture. Normal craniocervical junction. No suspicious bone lesions. Right-sided cervical rib like projection from the C7 vertebral body forming pseudarthrosis with the right first rib. Soft tissues and spinal canal: No prevertebral fluid or swelling. No visible canal hematoma. Atherosclerotic calcifications of the carotid bulbs. Disc levels: Central disc protrusions at C3-4 and C4-5. Small disc bulge at C5-6 results in no more than mild spinal canal stenosis. Upper chest: Unremarkable. Other: None. IMPRESSION: 1. No acute intracranial abnormality. Unchanged encephalomalacia in the right superior frontal gyrus, likely sequela of prior infarct or trauma. 2. No acute cervical spine fracture or traumatic listhesis. Electronically Signed   By: Emmit Alexanders M.D.   On: 11/11/2022 09:35    EKG: Independently reviewed.  Atrial flutter with 4:1  AV block 51 bpm.  Assessment/Plan Active Problems:   Hyperlipidemia   Essential hypertension   Hypothyroidism   Chronic anticoagulation   Cardioembolic stroke (HCC)   Edema   Atrial fibrillation (HCC)   Status post aortic valve and mitral valve replacement   Type 2 diabetes mellitus treated without insulin ([place])   Syncope    Syncopal episode in the  setting of new arrhythmia/bradycardia Noted to have prior chronic issues with orthostasis Continue telemetry monitoring Transfer to [facility] for ongoing cardiology evaluation 2D echocardiogram for further evaluation Previous 2D echocardiogram 04/01/2022 with LVEF 60% and noted prior mitral and aortic valve replacements Carotid ultrasound, with noted carotid stenosis and mild disease previously CTA head and neck with no acute findings Fall precautions Check orthostatics  AKI Usual baseline 0.8-1.1 Apparently with poor appetite recently which may be contributing Continue IV fluid with normal saline, time-limited Hold nephrotoxic agents Monitor urine output  Chronic HFpEF Currently appears to be euvolemic with no hypoxemia, BNP 137 Chest x-ray with some cardiomegaly, but no vascular congestion Gentle administration of IV fluid as noted above Holding torsemide for now 2D echocardiogram as above  Type 2 diabetes Hold metformin SSI and carb modified diet  Chronic atrial fibrillation with prior history of cardioembolic CVA Continue anticoagulation with Coumadin Holding metoprolol  Essential hypertension Currently with soft blood pressure readings Holding torsemide and Benicar Hold metoprolol given bradycardia  Hypothyroidism Continue levothyroxine  Dyslipidemia Continue statin  Chronic right shoulder pain Related to rotator cuff issues Diagnostic shoulder x-ray with no acute findings  DVT prophylaxis: Coumadin Code Status: Full Family Communication: Husband at bedside Disposition Plan:Admit to [facility] for further  cardiology evaluation Consults called:Cardiology Admission status: Observation, Tele  Severity of Illness: The appropriate patient status for this patient is OBSERVATION. Observation status is judged to be reasonable and necessary in order to provide the required intensity of service to ensure the patient's safety. The patient's presenting symptoms, physical exam findings, and initial radiographic and laboratory data in the context of their medical condition is felt to place them at decreased risk for further clinical deterioration. Furthermore, it is anticipated that the patient will be medically stable for discharge from the hospital within 2 midnights of admission.    Elo Marmolejos D Manuella Ghazi DO [facility]  If 7PM-7AM, please contact night-coverage [email or web address]  11/11/2022, 2:54 PM

## 2022-11-12 ENCOUNTER — Inpatient Hospital Stay (HOSPITAL_COMMUNITY): Payer: Medicare PPO

## 2022-11-12 DIAGNOSIS — I484 Atypical atrial flutter: Secondary | ICD-10-CM | POA: Diagnosis not present

## 2022-11-12 DIAGNOSIS — E038 Other specified hypothyroidism: Secondary | ICD-10-CM

## 2022-11-12 DIAGNOSIS — Z7901 Long term (current) use of anticoagulants: Secondary | ICD-10-CM

## 2022-11-12 DIAGNOSIS — E119 Type 2 diabetes mellitus without complications: Secondary | ICD-10-CM

## 2022-11-12 DIAGNOSIS — I5032 Chronic diastolic (congestive) heart failure: Secondary | ICD-10-CM | POA: Diagnosis present

## 2022-11-12 DIAGNOSIS — Z8249 Family history of ischemic heart disease and other diseases of the circulatory system: Secondary | ICD-10-CM | POA: Diagnosis not present

## 2022-11-12 DIAGNOSIS — I482 Chronic atrial fibrillation, unspecified: Secondary | ICD-10-CM | POA: Diagnosis present

## 2022-11-12 DIAGNOSIS — E1122 Type 2 diabetes mellitus with diabetic chronic kidney disease: Secondary | ICD-10-CM | POA: Diagnosis present

## 2022-11-12 DIAGNOSIS — G8929 Other chronic pain: Secondary | ICD-10-CM | POA: Diagnosis present

## 2022-11-12 DIAGNOSIS — R55 Syncope and collapse: Secondary | ICD-10-CM

## 2022-11-12 DIAGNOSIS — Z8261 Family history of arthritis: Secondary | ICD-10-CM | POA: Diagnosis not present

## 2022-11-12 DIAGNOSIS — F32A Depression, unspecified: Secondary | ICD-10-CM | POA: Diagnosis present

## 2022-11-12 DIAGNOSIS — Z7984 Long term (current) use of oral hypoglycemic drugs: Secondary | ICD-10-CM | POA: Diagnosis not present

## 2022-11-12 DIAGNOSIS — J4489 Other specified chronic obstructive pulmonary disease: Secondary | ICD-10-CM | POA: Diagnosis present

## 2022-11-12 DIAGNOSIS — I4892 Unspecified atrial flutter: Principal | ICD-10-CM

## 2022-11-12 DIAGNOSIS — I951 Orthostatic hypotension: Secondary | ICD-10-CM | POA: Diagnosis not present

## 2022-11-12 DIAGNOSIS — Z953 Presence of xenogenic heart valve: Secondary | ICD-10-CM | POA: Diagnosis not present

## 2022-11-12 DIAGNOSIS — R001 Bradycardia, unspecified: Secondary | ICD-10-CM | POA: Diagnosis present

## 2022-11-12 DIAGNOSIS — E785 Hyperlipidemia, unspecified: Secondary | ICD-10-CM | POA: Diagnosis present

## 2022-11-12 DIAGNOSIS — Z79899 Other long term (current) drug therapy: Secondary | ICD-10-CM | POA: Diagnosis not present

## 2022-11-12 DIAGNOSIS — E039 Hypothyroidism, unspecified: Secondary | ICD-10-CM | POA: Diagnosis present

## 2022-11-12 DIAGNOSIS — I639 Cerebral infarction, unspecified: Secondary | ICD-10-CM

## 2022-11-12 DIAGNOSIS — N1832 Chronic kidney disease, stage 3b: Secondary | ICD-10-CM | POA: Diagnosis present

## 2022-11-12 DIAGNOSIS — I4891 Unspecified atrial fibrillation: Secondary | ICD-10-CM | POA: Diagnosis not present

## 2022-11-12 DIAGNOSIS — Z825 Family history of asthma and other chronic lower respiratory diseases: Secondary | ICD-10-CM | POA: Diagnosis not present

## 2022-11-12 DIAGNOSIS — W19XXXA Unspecified fall, initial encounter: Secondary | ICD-10-CM | POA: Diagnosis not present

## 2022-11-12 DIAGNOSIS — Z8673 Personal history of transient ischemic attack (TIA), and cerebral infarction without residual deficits: Secondary | ICD-10-CM | POA: Diagnosis not present

## 2022-11-12 DIAGNOSIS — I130 Hypertensive heart and chronic kidney disease with heart failure and stage 1 through stage 4 chronic kidney disease, or unspecified chronic kidney disease: Secondary | ICD-10-CM | POA: Diagnosis present

## 2022-11-12 DIAGNOSIS — I440 Atrioventricular block, first degree: Secondary | ICD-10-CM | POA: Diagnosis present

## 2022-11-12 DIAGNOSIS — N179 Acute kidney failure, unspecified: Secondary | ICD-10-CM | POA: Diagnosis present

## 2022-11-12 DIAGNOSIS — M75111 Incomplete rotator cuff tear or rupture of right shoulder, not specified as traumatic: Secondary | ICD-10-CM | POA: Diagnosis present

## 2022-11-12 LAB — GLUCOSE, CAPILLARY
Glucose-Capillary: 109 mg/dL — ABNORMAL HIGH (ref 70–99)
Glucose-Capillary: 138 mg/dL — ABNORMAL HIGH (ref 70–99)
Glucose-Capillary: 141 mg/dL — ABNORMAL HIGH (ref 70–99)
Glucose-Capillary: 120 mg/dL — ABNORMAL HIGH (ref 70–99)

## 2022-11-12 LAB — BASIC METABOLIC PANEL
Anion gap: 12 (ref 5–15)
BUN: 23 mg/dL (ref 8–23)
CO2: 26 mmol/L (ref 22–32)
Calcium: 8.7 mg/dL — ABNORMAL LOW (ref 8.9–10.3)
Chloride: 95 mmol/L — ABNORMAL LOW (ref 98–111)
Creatinine, Ser: 1.39 mg/dL — ABNORMAL HIGH (ref 0.44–1.00)
GFR, Estimated: 39 mL/min — ABNORMAL LOW (ref >60)
Glucose, Bld: 104 mg/dL — ABNORMAL HIGH (ref 70–99)
Potassium: 3.6 mmol/L (ref 3.5–5.1)
Sodium: 133 mmol/L — ABNORMAL LOW (ref 135–145)

## 2022-11-12 LAB — MAGNESIUM: Magnesium: 1.7 mg/dL (ref 1.7–2.4)

## 2022-11-12 LAB — ECHOCARDIOGRAM COMPLETE
AR max vel: 0.95 cm2
AV Area VTI: 0.99 cm2
AV Area mean vel: 0.89 cm2
AV Mean grad: 10 mmHg
AV Peak grad: 18.1 mmHg
Ao pk vel: 2.13 m/s
Area-P 1/2: 3.54 cm2
Height: 62 in
MV VTI: 0.99 cm2
S' Lateral: 2.1 cm
Weight: 2606.4 oz

## 2022-11-12 LAB — PROTIME-INR
INR: 3.4 — ABNORMAL HIGH (ref 0.8–1.2)
Prothrombin Time: 34.4 seconds — ABNORMAL HIGH (ref 11.4–15.2)

## 2022-11-12 LAB — CBC
HCT: 39.4 % (ref 36.0–46.0)
Hemoglobin: 12.9 g/dL (ref 12.0–15.0)
MCH: 29.9 pg (ref 26.0–34.0)
MCHC: 32.7 g/dL (ref 30.0–36.0)
MCV: 91.4 fL (ref 80.0–100.0)
Platelets: 158 10*3/uL (ref 150–400)
RBC: 4.31 MIL/uL (ref 3.87–5.11)
RDW: 14.1 % (ref 11.5–15.5)
WBC: 6.9 10*3/uL (ref 4.0–10.5)
nRBC: 0 % (ref 0.0–0.2)

## 2022-11-12 MED ORDER — METHOCARBAMOL 500 MG PO TABS
500.0000 mg | ORAL_TABLET | Freq: Three times a day (TID) | ORAL | Status: DC
  Administered 2022-11-12 – 2022-11-13 (×4): 500 mg via ORAL
  Filled 2022-11-12 (×4): qty 1

## 2022-11-12 MED ORDER — MORPHINE SULFATE (PF) 2 MG/ML IV SOLN
1.0000 mg | INTRAVENOUS | Status: DC | PRN
  Administered 2022-11-12 – 2022-11-14 (×3): 1 mg via INTRAVENOUS
  Filled 2022-11-12 (×3): qty 1

## 2022-11-12 MED ORDER — WARFARIN SODIUM 2 MG PO TABS
2.0000 mg | ORAL_TABLET | Freq: Once | ORAL | Status: AC
  Administered 2022-11-12: 2 mg via ORAL
  Filled 2022-11-12: qty 1

## 2022-11-12 NOTE — Progress Notes (Signed)
ANTICOAGULATION CONSULT NOTE - Initial Consult  Pharmacy Consult for warfarin Indication: atrial fibrillation + AVR/MVR  Allergies  Allergen Reactions   Amiodarone Rash    Patient Measurements: Height: 5' 2"$  (157.5 cm) Weight: 73.9 kg (162 lb 14.4 oz) IBW/kg (Calculated) : 50.1 Heparin Dosing Weight:   Vital Signs: Temp: 97.3 F (36.3 C) (02/10 0516) Temp Source: Oral (02/10 0516) BP: 125/68 (02/10 0516) Pulse Rate: 100 (02/10 0516)  Labs: Recent Labs    11/11/22 0835 11/12/22 0029  HGB 13.7 12.9  HCT 41.5 39.4  PLT 166 158  LABPROT 35.4* 34.4*  INR 3.6* 3.4*  CREATININE 1.39* 1.39*     Estimated Creatinine Clearance: 31.4 mL/min (A) (by C-G formula based on SCr of 1.39 mg/dL (H)).   Medical History: Past Medical History:  Diagnosis Date   Anemia    Asthma    Atrial fibrillation ([place])    Breast carcinoma ([place]) 1995   Q000111Q   Cardioembolic stroke ([place]) Q000111Q   Right frontal in 01/2012; normal carotid ultrasound; possible LAA thrombus by TEE; virtual complete neurologic recovery   Chronic kidney disease, stage 2, mildly decreased GFR    GFR of approximately 60   Depression    Diabetes mellitus without complication ([place])    Diverticulosis of colon (without mention of hemorrhage) 2012   Dr. Laural Golden   Fasting hyperglycemia    120 fasting   Gastroesophageal reflux disease    Gastroparesis    Hemorrhoids    Hyperlipidemia    Hypertension    pt denies 05/30/13     Dr Kathe Mariner   Hyponatremia    Rheumatic heart disease    a. 07/2013 Echo: Ef 55-60%, Mod AS/AI, Mod-Sev MS, sev dil LA, PASP 58;  b. 07/2013 TEE: EF 35-40%, mild-mod AS/AI, mod-sev MS, LA smoke;  c. 07/2013 Cath: elev R heart pressures, nl cors.   Shortness of breath     Medications:  Medications Prior to Admission  Medication Sig Dispense Refill Last Dose   albuterol (PROVENTIL) (2.5 MG/3ML) 0.083% nebulizer solution Take 3 mLs (2.5 mg total) by nebulization every 4 (four) hours as  needed for wheezing or shortness of breath. (Patient taking differently: Take 2.5 mg by nebulization 2 (two) times daily as needed for wheezing or shortness of breath.) 75 mL 12 unknown   albuterol (VENTOLIN HFA) 108 (90 Base) MCG/ACT inhaler Inhale 1-2 puffs into the lungs every 6 (six) hours as needed for wheezing or shortness of breath.   11/11/2022   atorvastatin (LIPITOR) 40 MG tablet TAKE ONE TABLET BY MOUTH ONCE DAILY. (Patient taking differently: Take 40 mg by mouth daily.) 90 tablet 3 11/11/2022   budesonide (PULMICORT) 0.25 MG/2ML nebulizer solution One 0.25 mg  twice daily  along with albuterol (Patient taking differently: Take 0.25 mg by nebulization 2 (two) times daily as needed (wheezing, shortness of breath). One 0.25 mg  twice daily  along with albuterol) 120 mL 11 unknown   budesonide (PULMICORT) 0.5 MG/2ML nebulizer solution USE 1/2 VIAL IN NEBULIZER TWICE DAILY. DISCARD UNUSED PORTION AFTER EACH USE. (Patient taking differently: Take 0.25 mg by nebulization 2 (two) times daily. USE 1/2 VIAL IN NEBULIZER TWICE DAILY. DISCARD UNUSED PORTION AFTER EACH USE.) 120 mL 3 Past Week   cetirizine (ZYRTEC) 10 MG tablet Take 10 mg by mouth daily.   11/11/2022   dextromethorphan-guaiFENesin (MUCINEX DM) 30-600 MG 12hr tablet Take 1 tablet by mouth 2 (two) times daily as needed for cough.   unknown   diltiazem (  CARDIZEM CD) 180 MG 24 hr capsule Take 1 capsule (180 mg total) by mouth daily. 30 capsule 1 11/11/2022   escitalopram (LEXAPRO) 10 MG tablet Take 10 mg by mouth daily.    11/11/2022   famotidine (PEPCID) 20 MG tablet TAKE 1 TABLET AFTER SUPPER (Patient taking differently: Take 20 mg by mouth every evening. TAKE 1 TABLET AFTER SUPPER) 30 tablet 0 11/10/2022   fluticasone (FLONASE) 50 MCG/ACT nasal spray Place 1 spray into both nostrils daily.   11/10/2022   gabapentin (NEURONTIN) 100 MG capsule TAKE 1 CAPSULE BY MOUTH FOUR TIMES DAILY. (Patient taking differently: Take 100 mg by mouth 4 (four) times daily.)  120 capsule 3 11/11/2022   HYDROcodone-acetaminophen (NORCO/VICODIN) 5-325 MG tablet Take 1 tablet by mouth every 6 (six) hours as needed for moderate pain or severe pain.   11/11/2022   levothyroxine (SYNTHROID) 100 MCG tablet Take 100 mcg by mouth daily before breakfast.   11/11/2022   metFORMIN (GLUCOPHAGE) 500 MG tablet Take 1 tablet (500 mg total) by mouth 2 (two) times daily with a meal. 60 tablet 1 11/11/2022   metoprolol tartrate (LOPRESSOR) 25 MG tablet Take 1.5 tablets (37.5 mg total) by mouth 2 (two) times daily. 270 tablet 1 11/11/2022 at 0600   Multiple Vitamin (MULITIVITAMIN WITH MINERALS) TABS Take 1 tablet by mouth daily.   11/11/2022   olmesartan (BENICAR) 5 MG tablet Take 5 mg by mouth daily.   11/10/2022   pantoprazole (PROTONIX) 40 MG tablet TAKE 1 TABLET 30 TO 60 MINUTES BEFORE FIRST MEAL OF THE DAY. (Patient taking differently: Take 40 mg by mouth daily before breakfast.) 30 tablet 11 11/11/2022   potassium chloride SA (KLOR-CON M) 20 MEQ tablet Take 1 tablet (20 mEq total) by mouth 2 (two) times daily. 60 tablet 0 11/11/2022   tiZANidine (ZANAFLEX) 2 MG tablet Take 2 mg by mouth 2 (two) times daily as needed for muscle spasms.   11/11/2022   torsemide (DEMADEX) 20 MG tablet Take 2 tablets (40 mg total) by mouth every morning AND 1 tablet (20 mg total) every evening. (May take an extra 37m as needed for swelling or shortness of breath)..   11/11/2022   warfarin (COUMADIN) 2.5 MG tablet TAKE AS DIRECTED BY COUMADIN CLINIC. (Patient taking differently: Take 2.5-3.75 mg by mouth See admin instructions. Take 2.530mby mouth daily except on Sunday and Thursday. On Sunday and Thursday, take 3.7547my mouth.) 45 tablet 5 11/10/2022 at 1700    Assessment: Pharmacy consulted to dose warfarin in patient with atrial fibrillation.  Patient's home dose listed as 3.75 mg Sun + Thurs and 2.5 mg ROW with last dose 2/8 @ 1700.   INR is therapeutic today at upper end of goal range at 3.4. CBC wnl.   Goal of Therapy:   INR 2.5-3.5 Monitor platelets by anticoagulation protocol: Yes   Plan:  Warfarin 2 mg x 1 dose Monitor daily INR and s/sx of bleeding.  CouEliseo GumharmD PGY1 Pharmacy Resident   11/12/2022  7:52 AM

## 2022-11-12 NOTE — Progress Notes (Signed)
[facility]                                                                              [name], is a 77 y.o. female, DOB - 03-19-1946, FO:[id number] Admit date - 11/11/2022    Outpatient Primary MD for the patient is [name], MD  LOS - 0  days  Chief Complaint  Patient presents with   Weakness   Dizziness       Brief summary   Patient is a 77 y.o. female with asthma/COPD, atrial fibrillation on Coumadin, prior CVA, prior aortic and mitral valve replacement, hypothyroidism, DM type II presented to the ED after 2 syncopal episodes on the morning of admission, both of which were witnessed.  She had some dizziness and weakness with nausea and vomiting.  She has some chronic right shoulder pain with history of rotator cuff pain.  EMS was called and patient was brought to the ED for further evaluation.  Husband at bedside states that she has had poor appetite recently and has not been eating or drinking as much as usual.  In ED, initial bradycardia in the 50-60 bpm range and EKG showing atrial flutter with 4:1 block and cardiology consulted  Creatinine 1.39 and elevated from usual baseline of 0.8-1.1.  CT head and neck with no acute findings and chest x-ray and shoulder films with no acute findings.  Patient was [facility] to [facility] for further workup  Assessment & Plan    Principal Problem:  Syncope in the setting of new arrhythmia/bradycardia History of chronic Aflutter -Patient has a history of chronic atrial flutter on metoprolol and diltiazem.  EKG on admission showed A-fib flutter with 4:1 block, heart rate in 60s. -Cardiology consulted, recommended to hold AV nodal agents and continue Coumadin, EP evaluation -2D echo pending  Active Problems: History of rheumatic mitral valve disease with moderate MS s/p close mitral valve valvotomy, bioprosthetic MVR History of rheumatic aortic valve disease with moderate AS and regurg s/p bioprosthetic AVR in  2014 -Follow 2D echo -Continue Coumadin per pharmacy.  (Per cardiology, now that she has MVR, it is okay to switch to Eliquis but due to prior history of CVA, recommended to continue Coumadin)  Acute kidney injury -Baseline creatinine 0.8-1.1. -Likely renal secondary to poor appetite recently with nausea and vomiting -Creatinine 1.3, was placed on gentle hydration.,  Will continue for now. -Torsemide, Benicar held  Chronic diastolic CHF -Prior 2D echo in 03/2022 had shown EF of 60%, normal chronic HFpEF -Torsemide currently held due to AKI, follow 2D echo -Currently no hypoxia.  Diabetes mellitus type 2, NIDDM, with dyslipidemia -Hemoglobin A1c 7.1 11/11/2022 -Hold metformin -Continue sliding scale insulin, carb modified diet  Chronic atrial fibrillation with prior history of cardioembolic CVA -Cardizem, metoprolol currently held, heart rate now starting to trend up -Continue Coumadin per pharmacy  Essential hypertension -BB, CCB held due to #1 -Benicar, torsemide held due to AKI   Hypothyroidism -TSH 4.932 continue levothyroxine    Dyslipidemia Continue statin   Chronic right shoulder pain Related to rotator cuff issues Diagnostic shoulder x-ray with no acute findings  Code Status: Full code  DVT Prophylaxis:   warfarin (COUMADIN) tablet 2 mg   Level of Care: Level of care: Telemetry Cardiac Family Communication: Updated patient Disposition Plan:      Remains inpatient appropriate: Workup in progress   Procedures:  None  Consultants:   Cardiology  Antimicrobials: None    Medications  atorvastatin  40 mg Oral Daily   budesonide  0.25 mg Nebulization BID   escitalopram  10 mg Oral Daily   fluticasone  1 spray Each Nare Daily   gabapentin  100 mg Oral QID   insulin aspart  0-5 Units Subcutaneous QHS   insulin aspart  0-9 Units Subcutaneous TID WC   levothyroxine  100 mcg Oral QAC breakfast   loratadine  10 mg Oral Daily   methocarbamol  500 mg Oral TID    pantoprazole  40 mg Oral QAC breakfast   warfarin  2 mg Oral ONCE-1600   Warfarin - Pharmacist Dosing Inpatient   Does not apply q1600      Subjective:   [name] was seen and examined today.  No acute complaints, no chest pain, shortness of breath, nausea or vomiting.  Heart rate now starting to trend up.  Denies any dizziness.   Objective:   Vitals:   11/12/22 0036 11/12/22 0509 11/12/22 0516 11/12/22 0817  BP: 120/69  125/68 (!) 144/89  Pulse: (!) 103  100 (!) 102  Resp: 20  18 20  $ Temp: 97.9 F (36.6 C)  (!) 97.3 F (36.3 C)   TempSrc: Oral  Oral Oral  SpO2: 99%  98% 100%  Weight:  73.9 kg    Height:  5' 2"$  (1.575 m)      Intake/Output Summary (Last 24 hours) at 11/12/2022 1041 Last data filed at 11/12/2022 0818 Gross per 24 hour  Intake 735.9 ml  Output 300 ml  Net 435.9 ml     Wt Readings from Last 3 Encounters:  11/12/22 73.9 kg  10/10/22 75.4 kg  09/02/22 76.4 kg     Exam General: Alert and oriented x 3, NAD Cardiovascular: S1 S2 auscultated,  RRR Respiratory: Clear to auscultation bilaterally, no wheezing Gastrointestinal: Soft, nontender, nondistended, + bowel sounds Ext: no pedal edema bilaterally Neuro: Strength 5/5 upper and lower extremities bilaterally Psych: Normal affect     Data Reviewed:  I have personally reviewed following labs    CBC Lab Results  Component Value Date   WBC 6.9 11/12/2022   RBC 4.31 11/12/2022   HGB 12.9 11/12/2022   HCT 39.4 11/12/2022   MCV 91.4 11/12/2022   MCH 29.9 11/12/2022   PLT 158 11/12/2022   MCHC 32.7 11/12/2022   RDW 14.1 11/12/2022   LYMPHSABS 2.0 11/11/2022   MONOABS 0.6 11/11/2022   EOSABS 0.1 11/11/2022   BASOSABS 0.1 [id number]     Last metabolic panel Lab Results  Component Value Date   NA 133 (L) 11/12/2022   K 3.6 11/12/2022   CL 95 (L) 11/12/2022   CO2 26 11/12/2022   BUN 23 11/12/2022   CREATININE 1.39 (H) 11/12/2022   GLUCOSE 104 (H) 11/12/2022   GFRNONAA 39 (L)  11/12/2022   GFRAA 51 (L) 05/01/2020   CALCIUM 8.7 (L) 11/12/2022   PHOS 4.4 06/04/2021   PROT 7.0 11/11/2022   ALBUMIN 4.0 11/11/2022   BILITOT 0.7 11/11/2022   ALKPHOS 52 11/11/2022   AST 25 11/11/2022   ALT 22 11/11/2022   ANIONGAP 12 11/12/2022    CBG (last 3)  Recent Labs  11/11/22 1820 11/11/22 2113 11/12/22 0612  GLUCAP 135* 168* 109*      Coagulation Profile: Recent Labs  Lab 11/11/22 0835 11/12/22 0029  INR 3.6* 3.4*     Radiology Studies: I have personally reviewed the imaging studies  US Carotid Bilateral  Result Date: 11/11/2022 CLINICAL DATA:  Syncope EXAM: BILATERAL CAROTID DUPLEX ULTRASOUND TECHNIQUE: Pearline Cables scale imaging, color Doppler and duplex ultrasound were performed of bilateral carotid and vertebral arteries in the neck. COMPARISON:  None Available. FINDINGS: Criteria: Quantification of carotid stenosis is based on velocity parameters that correlate the residual internal carotid diameter with NASCET-based stenosis levels, using the diameter of the distal internal carotid lumen as the denominator for stenosis measurement. The following velocity measurements were obtained: RIGHT ICA: 81/28 cm/sec CCA: [id number] cm/sec SYSTOLIC ICA/CCA RATIO:  1.4 ECA:  90 cm/sec LEFT ICA: 92/27 cm/sec CCA: [id number] cm/sec SYSTOLIC ICA/CCA RATIO:  1.3 ECA:  81 cm/sec RIGHT CAROTID ARTERY: Trace atherosclerotic plaque in the proximal internal carotid artery. By peak systolic velocity criteria, the estimated stenosis is less than 50%. RIGHT VERTEBRAL ARTERY:  Patent with normal antegrade flow. LEFT CAROTID ARTERY: Trace mild heterogeneous atherosclerotic plaque in the proximal internal carotid artery without evidence of stenosis. LEFT VERTEBRAL ARTERY:  Patent with normal antegrade flow. IMPRESSION: 1. Mild (1-49%) stenosis proximal right internal carotid artery secondary to trace heterogeneous atherosclerotic plaque. 2. No significant atherosclerotic plaque or evidence of stenosis in the  left internal carotid artery. 3. Vertebral arteries are patent with normal antegrade flow. Signed, Criselda Peaches, MD, [facility] Electronically Signed   By: Jacqulynn Cadet M.D.   On: 11/11/2022 16:35   DG Shoulder Right  Result Date: 11/11/2022 CLINICAL DATA:  Syncope.  Fall.  Right shoulder pain. EXAM: RIGHT SHOULDER - 2+ VIEW COMPARISON:  None Available. FINDINGS: Three views are submitted. The bones appear mildly demineralized. No evidence of acute fracture or dislocation. Mild acromioclavicular degenerative changes. Status post median sternotomy and cardiac dual valve replacement. IMPRESSION: No evidence of acute fracture or dislocation. Electronically Signed   By: Richardean Sale M.D.   On: 11/11/2022 13:06   DG Chest Port 1 View  Result Date: 11/11/2022 CLINICAL DATA:  Dizziness and weakness associated with nausea and vomiting as well as chronic right shoulder pain. Recent witnessed falls. EXAM: PORTABLE CHEST 1 VIEW COMPARISON:  Chest radiograph dated 10/10/2022 FINDINGS: Normal lung volumes. No focal consolidations. No pleural effusion or pneumothorax. Similar enlarged, postsurgical cardiomediastinal silhouette allowing for differences in technique. Median sternotomy wires are nondisplaced. IMPRESSION: 1. No acute cardiopulmonary process. 2. Similar enlarged, postsurgical cardiomediastinal silhouette. Electronically Signed   By: Darrin Nipper M.D.   On: 11/11/2022 09:53   CT Cervical Spine Wo Contrast  Result Date: 11/11/2022 CLINICAL DATA:  Head/neck trauma. Dizziness and weakness. Unremarkable. EXAM: CT HEAD WITHOUT CONTRAST CT CERVICAL SPINE WITHOUT CONTRAST TECHNIQUE: Multidetector CT imaging of the head and cervical spine was performed following the standard protocol without intravenous contrast. Multiplanar CT image reconstructions of the cervical spine were also generated. RADIATION DOSE REDUCTION: This exam was performed  according to the departmental dose-optimization program which includes automated exposure control, adjustment of the mA and/or kV according to patient size and/or use of iterative reconstruction technique. COMPARISON:  Head CT 03/16/2022.  Cervical spine CT 02/24/2022. FINDINGS: CT HEAD FINDINGS Brain: No acute intracranial hemorrhage. Unchanged encephalomalacia in the right superior frontal gyrus, likely sequela of prior infarct or trauma. No hydrocephalus or extra-axial collection. Vascular: No  hyperdense vessel or unexpected calcification. Skull: No calvarial fracture or suspicious bone lesion. Skull base is unremarkable. Sinuses/Orbits: Unremarkable. Other: No scalp hematoma. CT CERVICAL SPINE FINDINGS Alignment: Normal. Skull base and vertebrae: No acute fracture. Normal craniocervical junction. No suspicious bone lesions. Right-sided cervical rib like projection from the C7 vertebral body forming pseudarthrosis with the right first rib. Soft tissues and spinal canal: No prevertebral fluid or swelling. No visible canal hematoma. Atherosclerotic calcifications of the carotid bulbs. Disc levels: Central disc protrusions at C3-4 and C4-5. Small disc bulge at C5-6 results in no more than mild spinal canal stenosis. Upper chest: Unremarkable. Other: None. IMPRESSION: 1. No acute intracranial abnormality. Unchanged encephalomalacia in the right superior frontal gyrus, likely sequela of prior infarct or trauma. 2. No acute cervical spine fracture or traumatic listhesis. Electronically Signed   By: Emmit Alexanders M.D.   On: 11/11/2022 09:35   CT Head Wo Contrast  Result Date: 11/11/2022 CLINICAL DATA:  Head/neck trauma. Dizziness and weakness. Unremarkable. EXAM: CT HEAD WITHOUT CONTRAST CT CERVICAL SPINE WITHOUT CONTRAST TECHNIQUE: Multidetector CT imaging of the head and cervical spine was performed following the standard protocol without intravenous contrast. Multiplanar CT image reconstructions of the cervical  spine were also generated. RADIATION DOSE REDUCTION: This exam was performed according to the departmental dose-optimization program which includes automated exposure control, adjustment of the mA and/or kV according to patient size and/or use of iterative reconstruction technique. COMPARISON:  Head CT 03/16/2022.  Cervical spine CT 02/24/2022. FINDINGS: CT HEAD FINDINGS Brain: No acute intracranial hemorrhage. Unchanged encephalomalacia in the right superior frontal gyrus, likely sequela of prior infarct or trauma. No hydrocephalus or extra-axial collection. Vascular: No hyperdense vessel or unexpected calcification. Skull: No calvarial fracture or suspicious bone lesion. Skull base is unremarkable. Sinuses/Orbits: Unremarkable. Other: No scalp hematoma. CT CERVICAL SPINE FINDINGS Alignment: Normal. Skull base and vertebrae: No acute fracture. Normal craniocervical junction. No suspicious bone lesions. Right-sided cervical rib like projection from the C7 vertebral body forming pseudarthrosis with the right first rib. Soft tissues and spinal canal: No prevertebral fluid or swelling. No visible canal hematoma. Atherosclerotic calcifications of the carotid bulbs. Disc levels: Central disc protrusions at C3-4 and C4-5. Small disc bulge at C5-6 results in no more than mild spinal canal stenosis. Upper chest: Unremarkable. Other: None. IMPRESSION: 1. No acute intracranial abnormality. Unchanged encephalomalacia in the right superior frontal gyrus, likely sequela of prior infarct or trauma. 2. No acute cervical spine fracture or traumatic listhesis. Electronically Signed   By: Emmit Alexanders M.D.   On: 11/11/2022 09:35       Symphani Eckstrom M.D. [facility] 11/12/2022, 10:41 AM  Available via Epic secure chat 7am-7pm After 7 pm, please refer to night coverage provider listed on amion.

## 2022-11-12 NOTE — Progress Notes (Signed)
  Echocardiogram 2D Echocardiogram has been performed.  [name] 11/12/2022, 2:23 PM

## 2022-11-13 ENCOUNTER — Inpatient Hospital Stay (HOSPITAL_COMMUNITY): Payer: Medicare PPO

## 2022-11-13 DIAGNOSIS — R55 Syncope and collapse: Secondary | ICD-10-CM | POA: Diagnosis not present

## 2022-11-13 DIAGNOSIS — I10 Essential (primary) hypertension: Secondary | ICD-10-CM

## 2022-11-13 DIAGNOSIS — N1832 Chronic kidney disease, stage 3b: Secondary | ICD-10-CM | POA: Diagnosis not present

## 2022-11-13 DIAGNOSIS — I484 Atypical atrial flutter: Secondary | ICD-10-CM

## 2022-11-13 DIAGNOSIS — W19XXXA Unspecified fall, initial encounter: Secondary | ICD-10-CM

## 2022-11-13 DIAGNOSIS — I4891 Unspecified atrial fibrillation: Secondary | ICD-10-CM | POA: Diagnosis not present

## 2022-11-13 DIAGNOSIS — I4892 Unspecified atrial flutter: Secondary | ICD-10-CM | POA: Diagnosis not present

## 2022-11-13 DIAGNOSIS — Z952 Presence of prosthetic heart valve: Secondary | ICD-10-CM

## 2022-11-13 LAB — GLUCOSE, CAPILLARY
Glucose-Capillary: 127 mg/dL — ABNORMAL HIGH (ref 70–99)
Glucose-Capillary: 137 mg/dL — ABNORMAL HIGH (ref 70–99)
Glucose-Capillary: 132 mg/dL — ABNORMAL HIGH (ref 70–99)
Glucose-Capillary: 186 mg/dL — ABNORMAL HIGH (ref 70–99)

## 2022-11-13 LAB — BASIC METABOLIC PANEL
Anion gap: 8 (ref 5–15)
BUN: 14 mg/dL (ref 8–23)
CO2: 27 mmol/L (ref 22–32)
Calcium: 9 mg/dL (ref 8.9–10.3)
Chloride: 100 mmol/L (ref 98–111)
Creatinine, Ser: 1.01 mg/dL — ABNORMAL HIGH (ref 0.44–1.00)
GFR, Estimated: 57 mL/min — ABNORMAL LOW (ref >60)
Glucose, Bld: 139 mg/dL — ABNORMAL HIGH (ref 70–99)
Potassium: 4 mmol/L (ref 3.5–5.1)
Sodium: 135 mmol/L (ref 135–145)

## 2022-11-13 LAB — PROTIME-INR
INR: 2.6 — ABNORMAL HIGH (ref 0.8–1.2)
Prothrombin Time: 28 seconds — ABNORMAL HIGH (ref 11.4–15.2)

## 2022-11-13 MED ORDER — KETOROLAC TROMETHAMINE 15 MG/ML IJ SOLN
15.0000 mg | Freq: Once | INTRAMUSCULAR | Status: AC
  Administered 2022-11-13: 15 mg via INTRAVENOUS
  Filled 2022-11-13: qty 1

## 2022-11-13 MED ORDER — WARFARIN SODIUM 2.5 MG PO TABS
3.7500 mg | ORAL_TABLET | Freq: Once | ORAL | Status: AC
  Administered 2022-11-13: 3.75 mg via ORAL
  Filled 2022-11-13: qty 1

## 2022-11-13 MED ORDER — TIZANIDINE HCL 4 MG PO TABS
2.0000 mg | ORAL_TABLET | Freq: Two times a day (BID) | ORAL | Status: DC
  Administered 2022-11-13 – 2022-11-14 (×3): 2 mg via ORAL
  Filled 2022-11-13 (×3): qty 1

## 2022-11-13 NOTE — Consult Note (Signed)
ELECTROPHYSIOLOGY CONSULT NOTE  Patient ID: [name], MRN: [id number], DOB/AGE: May 14, 1946 77 y.o. Admit date: 11/11/2022 Date of Consult: 11/13/2022  Primary Physician: [name], MD Primary Cardiologist: [name] is a 77 y.o. female who is being seen today for the evaluation of syncope at the request of Sara.   Chief Complaint: not sure (See Below)    HPI [name] is a 77 y.o. female seen for syncope. However, she denies syncope. EMS note was reviewed and the call was for shortness of breath.  Upon their arrival complaint was shoulder pain and weakness..  Initial blood pressure was 130 with a pulse of 79.  She fell backwards under the purview of the EMS feeling weak.  Without loss of consciousness. In the ER her blood pressure was elevated on 1 occasion noted to be 87  Has a history of low blood pressures in the past  Is unable to give a good history.  She is oriented to the president.  She gets weak and tired showering and dressing.  Her husband does most of the home activities.  Cooking shopping etc.  Occasional peripheral edema.  No nocturnal dyspnea. DATE TEST EF   10/14 LHC   No obstructive CAD  6/23 Echo   60% %   2/24 Echo  55-60% Mitral valve repair LAE mild        Date Cr K Hgb  2/24 1.01 4.0 12.9    Past Medical History:  Diagnosis Date   Anemia    Asthma    Atrial fibrillation ([place])    Breast carcinoma ([place]) 1995   Q000111Q   Cardioembolic stroke ([place]) Q000111Q   Right frontal in 01/2012; normal carotid ultrasound; possible LAA thrombus by TEE; virtual complete neurologic recovery   Chronic kidney disease, stage 2, mildly decreased GFR    GFR of approximately 60   Depression    Diabetes mellitus without complication ([place])    Diverticulosis of colon (without mention of hemorrhage) 2012   Dr. Laural Golden   Fasting hyperglycemia    120 fasting   Gastroesophageal reflux disease    Gastroparesis    Hemorrhoids    Hyperlipidemia     Hypertension    pt denies 05/30/13     Dr Kathe Mariner   Hyponatremia    Rheumatic heart disease    a. 07/2013 Echo: Ef 55-60%, Mod AS/AI, Mod-Sev MS, sev dil LA, PASP 58;  b. 07/2013 TEE: EF 35-40%, mild-mod AS/AI, mod-sev MS, LA smoke;  c. 07/2013 Cath: elev R heart pressures, nl cors.   Shortness of breath        Surgical History:  Past Surgical History:  Procedure Laterality Date   AORTIC VALVE REPLACEMENT N/A 07/18/2013   Procedure: AORTIC VALVE REPLACEMENT (AVR);  Surgeon: Gaye Pollack, MD;  Location: [facility];  Service: Open Heart Surgery;  Laterality: N/A;   BACK SURGERY     BREAST LUMPECTOMY Right 1995   CARDIAC CATHETERIZATION     CARDIOVERSION N/A 07/26/2013   Procedure: CARDIOVERSION;  Surgeon: Fay Records, MD;  Location: [place];  Service: Cardiovascular;  Laterality: N/A;   COLONOSCOPY  2012   Negative screening procedure   DILATION AND CURETTAGE OF UTERUS     ESOPHAGEAL MANOMETRY N/A 06/17/2013   Procedure: ESOPHAGEAL MANOMETRY (EM);  Surgeon: Sable Feil, MD;  Location: [facility];  Service: Endoscopy;  Laterality: N/A;   INTRAOPERATIVE TRANSESOPHAGEAL ECHOCARDIOGRAM N/A 07/18/2013   Procedure: INTRAOPERATIVE TRANSESOPHAGEAL ECHOCARDIOGRAM;  Surgeon: Gaye Pollack, MD;  Location: [place];  Service: Open Heart Surgery;  Laterality: N/A;   LEFT AND RIGHT HEART CATHETERIZATION WITH CORONARY ANGIOGRAM N/A 07/10/2013   Procedure: LEFT AND RIGHT HEART CATHETERIZATION WITH CORONARY ANGIOGRAM;  Surgeon: Burnell Blanks, MD;  Location: [facility] CATH LAB;  Service: Cardiovascular;  Laterality: N/A;   LUMBAR LAMINECTOMY/DECOMPRESSION MICRODISCECTOMY Right 05/13/2014   Procedure: LUMBAR LAMINECTOMY/DECOMPRESSION MICRODISCECTOMY 1 LEVEL  lumbar four/five;  Surgeon: Faythe Ghee, MD;  Location: [facility];  Service: Neurosurgery;  Laterality: Right;   MITRAL VALVE REPLACEMENT N/A 07/18/2013   Procedure: MITRAL VALVE (MV) REPLACEMENT;  Surgeon: Gaye Pollack, MD;   Location: [place] [facility];  Service: Open Heart Surgery;  Laterality: N/A;   MITRAL VALVE [place], closed mitral valvulotomy by finger fracture   TEE WITHOUT CARDIOVERSION  01/24/2012   Procedure: TRANSESOPHAGEAL ECHOCARDIOGRAM (TEE);  Surgeon: Lelon Perla, MD;  Location: [facility] ENDOSCOPY;  Service: Cardiovascular;  Laterality: N/A;   TEE WITHOUT CARDIOVERSION N/A 07/11/2013   Procedure: TRANSESOPHAGEAL ECHOCARDIOGRAM (TEE);  Surgeon: Thayer Headings, MD;  Location: [place];  Service: Cardiovascular;  Laterality: N/A;   TEE WITHOUT CARDIOVERSION N/A 07/26/2013   Procedure: TRANSESOPHAGEAL ECHOCARDIOGRAM (TEE);  Surgeon: Fay Records, MD;  Location: [place];  Service: Cardiovascular;  Laterality: N/A;   TUBAL LIGATION  1973     Home Meds: Prior to Admission medications   Medication Sig Start Date End Date Taking? Authorizing Provider  albuterol (PROVENTIL) (2.5 MG/3ML) 0.083% nebulizer solution Take 3 mLs (2.5 mg total) by nebulization every 4 (four) hours as needed for wheezing or shortness of breath. Patient taking differently: Take 2.5 mg by nebulization 2 (two) times daily as needed for wheezing or shortness of breath. 07/22/21  Yes Tanda Rockers, MD  albuterol (VENTOLIN HFA) 108 (90 Base) MCG/ACT inhaler Inhale 1-2 puffs into the lungs every 6 (six) hours as needed for wheezing or shortness of breath. 08/08/22  Yes [provider]  atorvastatin (LIPITOR) 40 MG tablet TAKE ONE TABLET BY MOUTH ONCE DAILY. Patient taking differently: Take 40 mg by mouth daily. 07/18/22  Yes Branch, Alphonse Guild, MD  budesonide (PULMICORT) 0.25 MG/2ML nebulizer solution One 0.25 mg  twice daily  along with albuterol Patient taking differently: Take 0.25 mg by nebulization 2 (two) times daily as needed (wheezing, shortness of breath). One 0.25 mg  twice daily  along with albuterol 12/13/21  Yes Tanda Rockers, MD  budesonide (PULMICORT) 0.5 MG/2ML nebulizer solution USE 1/2 VIAL IN  NEBULIZER TWICE DAILY. DISCARD UNUSED PORTION AFTER EACH USE. Patient taking differently: Take 0.25 mg by nebulization 2 (two) times daily. USE 1/2 VIAL IN NEBULIZER TWICE DAILY. DISCARD UNUSED PORTION AFTER EACH USE. 10/12/22  Yes Tanda Rockers, MD  cetirizine (ZYRTEC) 10 MG tablet Take 10 mg by mouth daily.   Yes [provider]  dextromethorphan-guaiFENesin (MUCINEX DM) 30-600 MG 12hr tablet Take 1 tablet by mouth 2 (two) times daily as needed for cough.   Yes [provider]  diltiazem (CARDIZEM CD) 180 MG 24 hr capsule Take 1 capsule (180 mg total) by mouth daily. 08/03/13  Yes Collins, Gina L, PA-C  escitalopram (LEXAPRO) 10 MG tablet Take 10 mg by mouth daily.  04/10/15  Yes [provider]  famotidine (PEPCID) 20 MG tablet TAKE 1 TABLET AFTER SUPPER Patient taking differently: Take 20 mg by mouth every evening. TAKE 1 TABLET AFTER SUPPER 07/18/22  Yes Tanda Rockers, MD  fluticasone (FLONASE) 50 MCG/ACT nasal spray Place 1 spray into both nostrils daily. 09/05/13  Yes BranchAlphonse Guild, MD  gabapentin (NEURONTIN) 100 MG capsule TAKE 1 CAPSULE BY MOUTH FOUR TIMES DAILY. Patient taking differently: Take 100 mg by mouth 4 (four) times daily. 08/09/22  Yes Tanda Rockers, MD  HYDROcodone-acetaminophen (NORCO/VICODIN) 5-325 MG tablet Take 1 tablet by mouth every 6 (six) hours as needed for moderate pain or severe pain. 11/09/22  Yes [provider]  levothyroxine (SYNTHROID) 100 MCG tablet Take 100 mcg by mouth daily before breakfast.   Yes [provider]  metFORMIN (GLUCOPHAGE) 500 MG tablet Take 1 tablet (500 mg total) by mouth 2 (two) times daily with a meal. 08/03/13  Yes Collins, Gina L, PA-C  metoprolol tartrate (LOPRESSOR) 25 MG tablet Take 1.5 tablets (37.5 mg total) by mouth 2 (two) times daily. 11/07/22  Yes BranchAlphonse Guild, MD  Multiple Vitamin (MULITIVITAMIN WITH MINERALS) TABS Take 1 tablet by mouth daily.   Yes [provider]   olmesartan (BENICAR) 5 MG tablet Take 5 mg by mouth daily.   Yes [provider]  pantoprazole (PROTONIX) 40 MG tablet TAKE 1 TABLET 30 TO 60 MINUTES BEFORE FIRST MEAL OF THE DAY. Patient taking differently: Take 40 mg by mouth daily before breakfast. 04/12/22  Yes Tanda Rockers, MD  potassium chloride SA (KLOR-CON M) 20 MEQ tablet Take 1 tablet (20 mEq total) by mouth 2 (two) times daily. 04/03/22 05/05/79 Yes Shah, Pratik D, DO  tiZANidine (ZANAFLEX) 2 MG tablet Take 2 mg by mouth 2 (two) times daily as needed for muscle spasms. 11/07/22  Yes [provider]  torsemide (DEMADEX) 20 MG tablet Take 2 tablets (40 mg total) by mouth every morning AND 1 tablet (20 mg total) every evening. (May take an extra 35m as needed for swelling or shortness of breath).. 10/10/22  Yes BArnoldo Lenis MD  warfarin (COUMADIN) 2.5 MG tablet TAKE AS DIRECTED BY COUMADIN CLINIC. Patient taking differently: Take 2.5-3.75 mg by mouth See admin instructions. Take 2.563mby mouth daily except on Sunday and Thursday. On Sunday and Thursday, take 3.7566my mouth. 03/09/22  Yes Branch, JAlphonse GuildD    Inpatient Medications:   atorvastatin  40 mg Oral Daily   budesonide  0.25 mg Nebulization BID   escitalopram  10 mg Oral Daily   fluticasone  1 spray Each Nare Daily   gabapentin  100 mg Oral QID   insulin aspart  0-5 Units Subcutaneous QHS   insulin aspart  0-9 Units Subcutaneous TID WC   ketorolac  15 mg Intravenous Once   levothyroxine  100 mcg Oral QAC breakfast   loratadine  10 mg Oral Daily   pantoprazole  40 mg Oral QAC breakfast   tiZANidine  2 mg Oral BID   warfarin  3.75 mg Oral ONCE-1600   Warfarin - Pharmacist Dosing Inpatient   Does not apply q1600      Allergies:  Allergies  Allergen Reactions   Amiodarone Rash    Social History   Socioeconomic History   Marital status: Married    Spouse name: Not on file   Number of children: 1   Years of education: Not on file   Highest  education level: Not on file  Occupational History   Occupation: Retired teaProduct managerETIRED  Tobacco Use   Smoking status: Never   Smokeless tobacco: Never  Vaping Use   Vaping Use: Never used  Substance and Sexual Activity   Alcohol use: No    Alcohol/week: 0.0 standard drinks of alcohol   Drug use: No   Sexual activity: Not Currently  Other Topics Concern   Not on file  Social History Narrative   Married with children   No regular exercise   Social Determinants of Health   Financial Resource Strain: Not on file  Food Insecurity: No Food Insecurity (11/11/2022)   Hunger Vital Sign    Worried About Running Out of Food in the Last Year: Never true    Ran Out of Food in the Last Year: Never true  Transportation Needs: No Transportation Needs (11/11/2022)   PRAPARE - Hydrologist (Medical): No    Lack of Transportation (Non-Medical): No  Physical Activity: Not on file  Stress: Not on file  Social Connections: Not on file  Intimate Partner Violence: Not At Risk (11/11/2022)   Humiliation, Afraid, Rape, and Kick questionnaire    Fear of Current or Ex-Partner: No    Emotionally Abused: No    Physically Abused: No    Sexually Abused: No     Family History  Problem Relation Age of Onset   Heart disease Brother 70       MI   Rheum arthritis Maternal Grandmother    Asthma Maternal Grandfather    Hypothyroidism Mother    Diabetes Sister    Cirrhosis Father      ROS:  Please see the history of present illness.     All other systems reviewed and negative.    Physical Exam:  Blood pressure 131/76, pulse (!) 105, temperature (!) 97.5 F (36.4 C), temperature source Oral, resp. rate 20, height 5' 2"$  (1.575 m), weight 74.5 kg, SpO2 99 %. General: Well developed, well nourished female in no acute distress. Head: Normocephalic, atraumatic, sclera non-icteric, no xanthomas, nares are without discharge. EENT: normal Lymph Nodes:  none Back:  without scoliosis/kyphosis, no CVA tendersness Neck: Negative for carotid bruits. JVD not elevated. Lungs: Clear bilaterally to auscultation without wheezes, rales, or rhonchi. Breathing is unlabored. Heart: Irregular rapid RR with S1 S2. No murmur , rubs, or gallops appreciated. Abdomen: Soft, non-tender, non-distended with normoactive bowel sounds. No hepatomegaly. No rebound/guarding. No obvious abdominal masses. Msk:  Strength and tone appear normal for age. Extremities: No clubbing or cyanosis. No  edema.  Distal pedal pulses are 2+ and equal bilaterally. Skin: Warm and Dry Neuro: Alert and oriented X 2. CN III-XII intact Grossly normal sensory and motor function . Psych:  Responds to questions appropriately with a normal affect.      Labs: Cardiac Enzymes No results for input(s): "CKTOTAL", "CKMB", "TROPONINI" in the last 72 hours. CBC Lab Results  Component Value Date   WBC 6.9 11/12/2022   HGB 12.9 11/12/2022   HCT 39.4 11/12/2022   MCV 91.4 11/12/2022   PLT 158 11/12/2022   PROTIME: Recent Labs    11/11/22 0835 11/12/22 0029 11/13/22 0032  LABPROT 35.4* 34.4* 28.0*  INR 3.6* 3.4* 2.6*   Chemistry  Recent Labs  Lab 11/11/22 0835 11/12/22 0029 11/13/22 0032  NA 134*   < > 135  K 3.7   < > 4.0  CL 94*   < > 100  CO2 26   < > 27  BUN 28*   < > 14  CREATININE 1.39*   < > 1.01*  CALCIUM 8.7*   < > 9.0  PROT 7.0  --   --  BILITOT 0.7  --   --   ALKPHOS 52  --   --   ALT 22  --   --   AST 25  --   --   GLUCOSE 181*   < > 139*   < > = values in this interval not displayed.   Lipids Lab Results  Component Value Date   CHOL 185 06/03/2021   HDL 66 06/03/2021   LDLCALC 104 (H) 06/03/2021   TRIG 75 06/03/2021   BNP Pro B Natriuretic peptide (BNP)  Date/Time Value Ref Range Status  07/08/2013 05:50 AM 459.2 (H) 0 - 125 pg/mL Final  07/07/2013 12:00 AM 661.1 (H) 0 - 125 pg/mL Final   Thyroid Function Tests: Recent Labs    11/11/22 1531  TSH 4.932*            EKG: 11/11/2022 atypical atrial flutter heart rate of 51 6/23 atypical atrial flutter heart rate 69-107 9/22   atypical atrial flutter heart rate 85 1/21 atypical atrial flutter 3/19 sinus rhythm    Evidence of more recent relative heart rate slowing as above  Assessment and Plan:  Atrial flutter-atypical-long-term persistent (no cardioversion that I can find)  Weakness  Relative bradycardia see above  Falls (witnessed by EMS) but denies syncope    Will check orthostatics.  Agree with resuming low-dose rate control. Will need to get clarification on the history from her husband when he returns.  Would also consider cardioversion.  While it is unlikely after 3 years to restore sinus rhythm, her left atrial size is surprisingly normal.  Timing to be determined and will defer to her primary cardiologist   Virl Axe

## 2022-11-13 NOTE — Progress Notes (Signed)
ANTICOAGULATION CONSULT NOTE - Initial Consult  Pharmacy Consult for warfarin Indication: atrial fibrillation + AVR/MVR  Allergies  Allergen Reactions   Amiodarone Rash    Patient Measurements: Height: 5' 2"$  (157.5 cm) Weight: 74.5 kg (164 lb 3.2 oz) IBW/kg (Calculated) : 50.1 Heparin Dosing Weight:   Vital Signs: Temp: 97.5 F (36.4 C) (02/11 0612) Temp Source: Oral (02/11 0612) BP: 131/76 (02/11 0612) Pulse Rate: 105 (02/11 0612)  Labs: Recent Labs    11/11/22 0835 11/12/22 0029 11/13/22 0032  HGB 13.7 12.9  --   HCT 41.5 39.4  --   PLT 166 158  --   LABPROT 35.4* 34.4* 28.0*  INR 3.6* 3.4* 2.6*  CREATININE 1.39* 1.39* 1.01*     Estimated Creatinine Clearance: 43.4 mL/min (A) (by C-G formula based on SCr of 1.01 mg/dL (H)).   Medical History: Past Medical History:  Diagnosis Date   Anemia    Asthma    Atrial fibrillation ([place])    Breast carcinoma ([place]) 1995   Q000111Q   Cardioembolic stroke ([place]) Q000111Q   Right frontal in 01/2012; normal carotid ultrasound; possible LAA thrombus by TEE; virtual complete neurologic recovery   Chronic kidney disease, stage 2, mildly decreased GFR    GFR of approximately 60   Depression    Diabetes mellitus without complication ([place])    Diverticulosis of colon (without mention of hemorrhage) 2012   Dr. Laural Golden   Fasting hyperglycemia    120 fasting   Gastroesophageal reflux disease    Gastroparesis    Hemorrhoids    Hyperlipidemia    Hypertension    pt denies 05/30/13     Dr Kathe Mariner   Hyponatremia    Rheumatic heart disease    a. 07/2013 Echo: Ef 55-60%, Mod AS/AI, Mod-Sev MS, sev dil LA, PASP 58;  b. 07/2013 TEE: EF 35-40%, mild-mod AS/AI, mod-sev MS, LA smoke;  c. 07/2013 Cath: elev R heart pressures, nl cors.   Shortness of breath     Medications:  Medications Prior to Admission  Medication Sig Dispense Refill Last Dose   albuterol (PROVENTIL) (2.5 MG/3ML) 0.083% nebulizer solution Take 3 mLs (2.5 mg  total) by nebulization every 4 (four) hours as needed for wheezing or shortness of breath. (Patient taking differently: Take 2.5 mg by nebulization 2 (two) times daily as needed for wheezing or shortness of breath.) 75 mL 12 unknown   albuterol (VENTOLIN HFA) 108 (90 Base) MCG/ACT inhaler Inhale 1-2 puffs into the lungs every 6 (six) hours as needed for wheezing or shortness of breath.   11/11/2022   atorvastatin (LIPITOR) 40 MG tablet TAKE ONE TABLET BY MOUTH ONCE DAILY. (Patient taking differently: Take 40 mg by mouth daily.) 90 tablet 3 11/11/2022   budesonide (PULMICORT) 0.25 MG/2ML nebulizer solution One 0.25 mg  twice daily  along with albuterol (Patient taking differently: Take 0.25 mg by nebulization 2 (two) times daily as needed (wheezing, shortness of breath). One 0.25 mg  twice daily  along with albuterol) 120 mL 11 unknown   budesonide (PULMICORT) 0.5 MG/2ML nebulizer solution USE 1/2 VIAL IN NEBULIZER TWICE DAILY. DISCARD UNUSED PORTION AFTER EACH USE. (Patient taking differently: Take 0.25 mg by nebulization 2 (two) times daily. USE 1/2 VIAL IN NEBULIZER TWICE DAILY. DISCARD UNUSED PORTION AFTER EACH USE.) 120 mL 3 Past Week   cetirizine (ZYRTEC) 10 MG tablet Take 10 mg by mouth daily.   11/11/2022   dextromethorphan-guaiFENesin (MUCINEX DM) 30-600 MG 12hr tablet Take 1 tablet by mouth  2 (two) times daily as needed for cough.   unknown   diltiazem (CARDIZEM CD) 180 MG 24 hr capsule Take 1 capsule (180 mg total) by mouth daily. 30 capsule 1 11/11/2022   escitalopram (LEXAPRO) 10 MG tablet Take 10 mg by mouth daily.    11/11/2022   famotidine (PEPCID) 20 MG tablet TAKE 1 TABLET AFTER SUPPER (Patient taking differently: Take 20 mg by mouth every evening. TAKE 1 TABLET AFTER SUPPER) 30 tablet 0 11/10/2022   fluticasone (FLONASE) 50 MCG/ACT nasal spray Place 1 spray into both nostrils daily.   11/10/2022   gabapentin (NEURONTIN) 100 MG capsule TAKE 1 CAPSULE BY MOUTH FOUR TIMES DAILY. (Patient taking  differently: Take 100 mg by mouth 4 (four) times daily.) 120 capsule 3 11/11/2022   HYDROcodone-acetaminophen (NORCO/VICODIN) 5-325 MG tablet Take 1 tablet by mouth every 6 (six) hours as needed for moderate pain or severe pain.   11/11/2022   levothyroxine (SYNTHROID) 100 MCG tablet Take 100 mcg by mouth daily before breakfast.   11/11/2022   metFORMIN (GLUCOPHAGE) 500 MG tablet Take 1 tablet (500 mg total) by mouth 2 (two) times daily with a meal. 60 tablet 1 11/11/2022   metoprolol tartrate (LOPRESSOR) 25 MG tablet Take 1.5 tablets (37.5 mg total) by mouth 2 (two) times daily. 270 tablet 1 11/11/2022 at 0600   Multiple Vitamin (MULITIVITAMIN WITH MINERALS) TABS Take 1 tablet by mouth daily.   11/11/2022   olmesartan (BENICAR) 5 MG tablet Take 5 mg by mouth daily.   11/10/2022   pantoprazole (PROTONIX) 40 MG tablet TAKE 1 TABLET 30 TO 60 MINUTES BEFORE FIRST MEAL OF THE DAY. (Patient taking differently: Take 40 mg by mouth daily before breakfast.) 30 tablet 11 11/11/2022   potassium chloride SA (KLOR-CON M) 20 MEQ tablet Take 1 tablet (20 mEq total) by mouth 2 (two) times daily. 60 tablet 0 11/11/2022   tiZANidine (ZANAFLEX) 2 MG tablet Take 2 mg by mouth 2 (two) times daily as needed for muscle spasms.   11/11/2022   torsemide (DEMADEX) 20 MG tablet Take 2 tablets (40 mg total) by mouth every morning AND 1 tablet (20 mg total) every evening. (May take an extra 71m as needed for swelling or shortness of breath)..   11/11/2022   warfarin (COUMADIN) 2.5 MG tablet TAKE AS DIRECTED BY COUMADIN CLINIC. (Patient taking differently: Take 2.5-3.75 mg by mouth See admin instructions. Take 2.532mby mouth daily except on Sunday and Thursday. On Sunday and Thursday, take 3.7520my mouth.) 45 tablet 5 11/10/2022 at 1700    Assessment: Pharmacy consulted to dose warfarin in patient with atrial fibrillation.  Patient's home dose listed as 3.75 mg Sun + Thurs and 2.5 mg ROW with last dose 2/8 @ 1700.   INR is therapeutic today at 2.6  at lower end of therapeutic range. CBC wnl 2/10.   Goal of Therapy:  INR 2.5-3.5 Monitor platelets by anticoagulation protocol: Yes   Plan:  Warfarin 3.75 mg x 1  Monitor daily INR and s/sx of bleeding  CouEliseo GumharmD PGY1 Pharmacy Resident   11/13/2022  7:51 AM

## 2022-11-13 NOTE — Evaluation (Signed)
Physical Therapy Evaluation Patient Details Name: [name] MRN: [id number] DOB: 1945/12/29 Today's Date: 11/13/2022  History of Present Illness  Patient is a 77 y.o. female who presented to the ED after 2 syncopal episodes on the morning of admission, both of which were witnessed; pt amnestic to syncopal episodes, her main concern is her R shoulder pain; asthma/COPD, atrial fibrillation on Coumadin, prior CVA, prior aortic and mitral valve replacement, hypothyroidism, DM type II  Clinical Impression   Pt admitted with above diagnosis. Lives at home with husband, in a two-level home (can stay on first level) with a few steps to enter; Prior to admission, pt was able to manage independently within the house, using a cane as needed; Presents to PT with decr activity tolerance, R shoulder pain, fucntional dependencies;  Overall min assist to sit up, stand up, and maintain balance at bedside; deferred amb today due to hypotension in standing (see vitals flow sheets or below for orthostatics); While BPs did drop quite low in standing, it is promising that her Diastolic BP was able to rally with a few more mintues in standing; Pt currently with functional limitations due to the deficits listed below (see PT Problem List). Pt will benefit from skilled PT to increase their independence and safety with mobility to allow discharge to the venue listed below.          Recommendations for follow up therapy are one component of a multi-disciplinary discharge planning process, led by the attending physician.  Recommendations may be updated based on patient status, additional functional criteria and insurance authorization.  Follow Up Recommendations Home health PT (followed by outpt PT or OT for shoulder)      Assistance Recommended at Discharge Intermittent Supervision/Assistance  Patient can return home with the following  A little help with walking and/or transfers;A little help with  bathing/dressing/bathroom;Assistance with cooking/housework;Help with stairs or ramp for entrance    Equipment Recommendations Rollator (4 wheels) (will consider rollator)  Recommendations for Other Services  OT consult (ordered per protocol)    Functional Status Assessment Patient has had a recent decline in their functional status and demonstrates the ability to make significant improvements in function in a reasonable and predictable amount of time.     Precautions / Restrictions Precautions Precautions: Fall Precaution Comments: wathc BP Restrictions Weight Bearing Restrictions: No      Mobility  Bed Mobility Overal bed mobility: Needs Assistance Bed Mobility: Supine to Sit     Supine to sit: Supervision          Transfers Overall transfer level: Needs assistance Equipment used: 1 person hand held assist Transfers: Sit to/from Stand Sit to Stand: Min assist           General transfer comment: Min asisst to steady and for safety    Ambulation/Gait               General Gait Details: Held amb today due to hypotension  Stairs            Wheelchair Mobility    Modified Rankin (Stroke Patients Only)       Balance Overall balance assessment: Needs assistance   Sitting balance-Leahy Scale: Fair       Standing balance-Leahy Scale: Fair                               Pertinent Vitals/Pain Pain Assessment Pain Assessment: 0-10 Pain Score: 9  Pain  Location: R shoulder Pain Descriptors / Indicators: Constant, Grimacing, Guarding Pain Intervention(s): Repositioned, Limited activity within patient's tolerance    Home Living Family/patient expects to be discharged to:: Private residence Living Arrangements: Spouse/significant other Available Help at Discharge: Family;Available PRN/intermittently;Available 24 hours/day Type of Home: House Home Access: Stairs to enter Entrance Stairs-Rails: Right;Left;Can reach both Entrance  Stairs-Number of Steps: 4   Home Layout: Two level;Able to live on main level with bedroom/bathroom Home Equipment: Cane - quad;Shower seat;Grab bars - tub/shower;Toilet riser      Prior Function Prior Level of Function : Needs assist             Mobility Comments: Patient was able to ambulate at home and in the community independently with quad cane. Not currently driving ADLs Comments: Patient reports being mostly independent with ADL's with some assistance for cleaning from husband and 1x cleaning services     Hand Dominance   Dominant Hand: Right    Extremity/Trunk Assessment   Upper Extremity Assessment Upper Extremity Assessment: RUE deficits/detail;Defer to OT evaluation RUE Deficits / Details: Dominant hand, painful with any movement of shoulder    Lower Extremity Assessment Lower Extremity Assessment: Generalized weakness       Communication   Communication: No difficulties  Cognition Arousal/Alertness: Awake/alert (though initially sleepy) Behavior During Therapy: WFL for tasks assessed/performed Overall Cognitive Status: Impaired/Different from baseline Area of Impairment: Memory                     Memory:  (amnestic to syncopal episodes)         General Comments: Recommend further cognitive eval        General Comments General comments (skin integrity, edema, etc.):   11/13/22 1619  Vital Signs  Patient Position (if appropriate) Orthostatic Vitals  Orthostatic Lying   BP- Lying 112/63 (MAP 79)  Pulse- Lying 105  Orthostatic Sitting  BP- Sitting 91/77 (MAP 84)  Pulse- Sitting 106  Orthostatic Standing at 0 minutes  BP- Standing at 0 minutes (!) 77/36 (MAP 49)  Pulse- Standing at 0 minutes 107  Orthostatic Standing at 3 minutes  BP- Standing at 3 minutes (!) 85/71 (MAP 77)  Pulse- Standing at 3 minutes 108       Exercises     Assessment/Plan    PT Assessment Patient needs continued PT services  PT Problem List  Decreased strength;Decreased range of motion;Decreased activity tolerance;Decreased balance;Decreased mobility;Decreased coordination;Decreased knowledge of use of DME;Decreased safety awareness;Decreased knowledge of precautions;Cardiopulmonary status limiting activity;Pain       PT Treatment Interventions DME instruction;Gait training;Stair training;Functional mobility training;Therapeutic activities;Therapeutic exercise;Balance training;Neuromuscular re-education;Cognitive remediation;Patient/family education    PT Goals (Current goals can be found in the Care Plan section)  Acute Rehab PT Goals Patient Stated Goal: less shoulder pain PT Goal Formulation: With patient/family Time For Goal Achievement: 11/13/22 Potential to Achieve Goals: Good    Frequency Min 3X/week     Co-evaluation               AM-PAC PT "6 Clicks" Mobility  Outcome Measure Help needed turning from your back to your side while in a flat bed without using bedrails?: None Help needed moving from lying on your back to sitting on the side of a flat bed without using bedrails?: A Little Help needed moving to and from a bed to a chair (including a wheelchair)?: A Little Help needed standing up from a chair using your arms (e.g., wheelchair or bedside chair)?: A Little  Help needed to walk in hospital room?: A Lot Help needed climbing 3-5 steps with a railing? : A Lot 6 Click Score: 17    End of Session Equipment Utilized During Treatment: Gait belt Activity Tolerance: Patient tolerated treatment well Patient left: in bed;with call bell/phone within reach;with family/visitor present Nurse Communication: Mobility status (and results of orhtostatics) PT Visit Diagnosis: Unsteadiness on feet (R26.81);Other abnormalities of gait and mobility (R26.89);Pain Pain - Right/Left: Right Pain - part of body: Shoulder    Time: 1554-1630 PT Time Calculation (min) (ACUTE ONLY): 36 min   Charges:   PT Evaluation $PT  Eval Moderate Complexity: 1 Mod PT Treatments $Therapeutic Activity: 8-22 mins        Roney Marion, PT  Acute Rehabilitation Services Office [phone]   Colletta [place] 11/13/2022, 6:41 PM

## 2022-11-13 NOTE — Progress Notes (Signed)
[facility]                                                                              [name], is a 77 y.o. female, DOB - [birth year], [id number] Admit date - 11/11/2022    Outpatient Primary MD for the patient is Sara Noble, MD  LOS - 1  days  Chief Complaint  Patient presents with   Weakness   Dizziness       Brief summary   Patient is a 77 y.o. female with asthma/COPD, atrial fibrillation on Coumadin, prior CVA, prior aortic and mitral valve replacement, hypothyroidism, DM type II presented to the ED after 2 syncopal episodes on the morning of admission, both of which were witnessed.  She had some dizziness and weakness with nausea and vomiting.  She has some chronic right shoulder pain with history of rotator cuff pain.  EMS was called and patient was brought to the ED for further evaluation.  Husband at bedside states that she has had poor appetite recently and has not been eating or drinking as much as usual.  In ED, initial bradycardia in the 50-60 bpm range and EKG showing atrial flutter with 4:1 block and cardiology consulted  Creatinine 1.39 and elevated from usual baseline of 0.8-1.1.  CT head and neck with no acute findings and chest x-ray and shoulder films with no acute findings.  Patient was transferred to [facility] for further workup and EP evaluation  Assessment & Plan    Principal Problem:  Syncope in the setting of new arrhythmia/bradycardia -Patient has a history of chronic atrial flutter on metoprolol and diltiazem.  EKG on admission showed A-fib flutter with 4:1 block, heart rate in 60s. -Cardiology consulted, recommended to hold AV nodal agents and continue Coumadin, EP evaluation -2D echo showed EF of 55 to 60%, mild LVH, indeterminate diastolic filling.  Moderately elevated PA pressure 54.3 mmHg -Carotid Dopplers mild 1 to 49% stenosis proximal right ICA, no significant atherosclerotic plaque or stenosis in left ICA.  Active  Problems: History of chronic atrial flutter, now in atrial flutter with RVR -Metoprolol, diltiazem were held on admission due to bradycardia and 4:1 block -Requested urgent EP cardiology consult, may need to be placed back on AV nodal blocking agent, (allergy to amiodarone) -Continue Coumadin per pharmacy, INR therapeutic   History of rheumatic mitral valve disease with moderate MS s/p close mitral valve valvotomy, bioprosthetic MVR History of rheumatic aortic valve disease with moderate AS and regurg s/p bioprosthetic AVR in 2014 -Continue Coumadin per pharmacy.  (Per cardiology, now that she has MVR, it is okay to switch to Eliquis but due to prior history of CVA, recommended to continue Coumadin) - -2D echo showed EF of 55 to 60%, mild LVH, indeterminate diastolic filling.  Moderately elevated PA pressure 54.3 mmHg  Acute kidney injury -Baseline creatinine 0.8-1.1. -Likely renal secondary to poor appetite recently with nausea and vomiting -Creatinine 1.3, was placed on gentle hydration, creatinine improved to 1.0 at baseline -Torsemide, Benicar held  Chronic diastolic CHF -Prior 2D echo in 03/2022 had shown EF of 60%, normal chronic HFpEF -Torsemide currently held due to  AKI -2D echo with EF 55 to 60%, mild LVH, indeterminate diastolic filling  Acute on chronic right shoulder pain -Possibly due to rotator cuff issues or severe osteoarthritis, x-ray negative for any fracture or dislocation -Will obtain MRI of the right shoulder -Pain control.  Diabetes mellitus type 2, NIDDM, with dyslipidemia -Hemoglobin A1c 7.1 11/11/2022 -Hold metformin -Continue sliding scale insulin, carb modified diet  Essential hypertension -BB, CCB held due to #1 -Benicar, torsemide held due to AKI   Hypothyroidism -TSH 4.932 continue levothyroxine    Dyslipidemia Continue statin    Code Status: Full code DVT Prophylaxis:   warfarin (COUMADIN) tablet 3.75 mg   Level of Care: Level of care:  Telemetry Cardiac Family Communication: Updated patient's husband at the bedside today Disposition Plan:      Remains inpatient appropriate: Workup in progress   Procedures:  None  Consultants:   Cardiology EP cardiology  Antimicrobials: None    Medications  atorvastatin  40 mg Oral Daily   budesonide  0.25 mg Nebulization BID   escitalopram  10 mg Oral Daily   fluticasone  1 spray Each Nare Daily   gabapentin  100 mg Oral QID   insulin aspart  0-5 Units Subcutaneous QHS   insulin aspart  0-9 Units Subcutaneous TID WC   levothyroxine  100 mcg Oral QAC breakfast   loratadine  10 mg Oral Daily   methocarbamol  500 mg Oral TID   pantoprazole  40 mg Oral QAC breakfast   warfarin  3.75 mg Oral ONCE-1600   Warfarin - Pharmacist Dosing Inpatient   Does not apply q1600      Subjective:   [name] was seen and examined today.  Sitting up in the chair, feels miserable due to right shoulder pain, 9/10, not been able to sleep last night due to pain.  Heart rate now elevated in rapid A-fib.  No chest pain.    Objective:   Vitals:   11/12/22 1632 11/12/22 1943 11/13/22 0612 11/13/22 0853  BP: (!) 145/83 131/77 131/76   Pulse: (!) 105  (!) 105   Resp: 20 20 20   $ Temp: (!) 97.5 F (36.4 C) (!) 97.5 F (36.4 C) (!) 97.5 F (36.4 C)   TempSrc: Oral Oral Oral   SpO2: 97%  99% 99%  Weight:   74.5 kg   Height:        Intake/Output Summary (Last 24 hours) at 11/13/2022 1105 Last data filed at 11/13/2022 0900 Gross per 24 hour  Intake 480 ml  Output 1200 ml  Net -720 ml     Wt Readings from Last 3 Encounters:  11/13/22 74.5 kg  10/10/22 75.4 kg  09/02/22 76.4 kg   Physical Exam General: Alert and oriented x 3, NAD, uncomfortable Cardiovascular: Irregularly rate, tachycardia Respiratory: Diminished BS at the bases Gastrointestinal: Soft, nontender, nondistended, NBS Ext: no pedal edema bilaterally, ice pack on the right shoulder, ROM dec due to pain Neuro: no new  deficits Skin: No rashes Psych: Normal affect     Data Reviewed:  I have personally reviewed following labs    CBC Lab Results  Component Value Date   WBC 6.9 11/12/2022   RBC 4.31 11/12/2022   HGB 12.9 11/12/2022   HCT 39.4 11/12/2022   MCV 91.4 11/12/2022   MCH 29.9 11/12/2022   PLT 158 11/12/2022   MCHC 32.7 11/12/2022   RDW 14.1 11/12/2022   LYMPHSABS 2.0 11/11/2022   MONOABS 0.6 11/11/2022   EOSABS 0.1 11/11/2022  BASOSABS 0.1 [id number]     Last metabolic panel Lab Results  Component Value Date   NA 135 11/13/2022   K 4.0 11/13/2022   CL 100 11/13/2022   CO2 27 11/13/2022   BUN 14 11/13/2022   CREATININE 1.01 (H) 11/13/2022   GLUCOSE 139 (H) 11/13/2022   GFRNONAA 57 (L) 11/13/2022   GFRAA 51 (L) 05/01/2020   CALCIUM 9.0 11/13/2022   PHOS 4.4 06/04/2021   PROT 7.0 11/11/2022   ALBUMIN 4.0 11/11/2022   BILITOT 0.7 11/11/2022   ALKPHOS 52 11/11/2022   AST 25 11/11/2022   ALT 22 11/11/2022   ANIONGAP 8 11/13/2022    CBG (last 3)  Recent Labs    11/12/22 1627 11/12/22 2105 11/13/22 0612  GLUCAP 141* 120* 127*      Coagulation Profile: Recent Labs  Lab 11/11/22 0835 11/12/22 0029 11/13/22 0032  INR 3.6* 3.4* 2.6*     Radiology Studies: I have personally reviewed the imaging studies  ECHOCARDIOGRAM COMPLETE  Result Date: 11/12/2022    ECHOCARDIOGRAM REPORT   Patient Name:   LYNETTE PETTIT Date of Exam: 11/12/2022 Medical Rec #:  [id number]       Height:       62.0 in Accession #:    [id number]      Weight:       162.9 lb Date of Birth:  11/04/1945       BSA:          1.752 m Patient Age:    69 years        BP:           144/89 mmHg Patient Gender: F               HR:           103 bpm. Exam Location:  Inpatient Procedure: 2D Echo, Color Doppler and Cardiac Doppler Indications:    syncope  History:        Patient has prior history of Echocardiogram examinations, most                 recent 04/01/2022. COPD, Arrythmias:Atrial Fibrillation,                  Signs/Symptoms:Syncope; Risk Factors:Hypertension, Dyslipidemia                 and Diabetes.                 Aortic Valve: 21 mm Magna Ease valve is present in the aortic                 position. Procedure Date: 2014.                 Mitral Valve: 25 mm Magna Ease bioprosthetic valve valve is                 present in the mitral position. Procedure Date: 2014.  Sonographer:    Johny Chess RDCS Referring Phys: [id number] [place]  1. Left ventricular ejection fraction, by estimation, is 55 to 60%. The left ventricle has normal function. The left ventricle has no regional wall motion abnormalities. There is mild left ventricular hypertrophy. Indeterminate diastolic filling due to E-A fusion.  2. Right ventricular systolic function was not well visualized. The right ventricular size is normal. There is moderately elevated pulmonary artery systolic pressure. The estimated right ventricular systolic pressure is [id number] mmHg.  3. Left atrial size was mildly  dilated.  4. The mitral valve has been repaired/replaced. No evidence of mitral valve regurgitation. No evidence of mitral stenosis. The mean mitral valve gradient is 7.0 mmHg at HR of 105 bpm. There is a 25 mm Magna Ease bioprosthetic valve present in the mitral  position. Procedure Date: 2014. Grossly normal function.  5. The aortic valve has been repaired/replaced. Aortic valve regurgitation is not visualized. No aortic stenosis is present. There is a 21 mm Magna Ease valve present in the aortic position. Procedure Date: 2014. Aortic valve mean gradient measures 10.0  mmHg. Aortic valve Vmax measures 2.13 m/s. Grossly normal function.  6. The inferior vena cava is normal in size with greater than 50% respiratory variability, suggesting right atrial pressure of 3 mmHg. Comparison(s): No significant change from prior study. FINDINGS  Left Ventricle: Left ventricular ejection fraction, by estimation, is 55 to 60%. The left ventricle has  normal function. The left ventricle has no regional wall motion abnormalities. The left ventricular internal cavity size was normal in size. There is  mild left ventricular hypertrophy. Indeterminate diastolic filling due to E-A fusion. Right Ventricle: The right ventricular size is normal. No increase in right ventricular wall thickness. Right ventricular systolic function was not well visualized. There is moderately elevated pulmonary artery systolic pressure. The tricuspid regurgitant velocity is 3.58 m/s, and with an assumed right atrial pressure of 3 mmHg, the estimated right ventricular systolic pressure is [id number] mmHg. Left Atrium: Left atrial size was mildly dilated. Right Atrium: Right atrial size was normal in size. Pericardium: There is no evidence of pericardial effusion. Mitral Valve: The mitral valve has been repaired/replaced. No evidence of mitral valve regurgitation. There is a 25 mm Magna Ease bioprosthetic valve present in the mitral position. Procedure Date: 2014. No evidence of mitral valve stenosis. MV peak gradient, 13.2 mmHg. The mean mitral valve gradient is 7.0 mmHg. Tricuspid Valve: The tricuspid valve is normal in structure. Tricuspid valve regurgitation is mild . No evidence of tricuspid stenosis. Aortic Valve: The aortic valve has been repaired/replaced. Aortic valve regurgitation is not visualized. No aortic stenosis is present. Aortic valve mean gradient measures 10.0 mmHg. Aortic valve peak gradient measures 18.1 mmHg. Aortic valve area, by VTI measures 0.99 cm. There is a 21 mm Magna Ease valve present in the aortic position. Procedure Date: 2014. Pulmonic Valve: The pulmonic valve was normal in structure. Pulmonic valve regurgitation is trivial. No evidence of pulmonic stenosis. Aorta: The aortic root is normal in size and structure. Venous: The inferior vena cava is normal in size with greater than 50% respiratory variability, suggesting right atrial pressure of 3 mmHg. IAS/Shunts:  No atrial level shunt detected by color flow Doppler.  LEFT VENTRICLE PLAX 2D LVIDd:         2.90 cm LVIDs:         2.10 cm LV PW:         1.20 cm LV IVS:        0.90 cm LVOT diam:     1.60 cm LV SV:         36 LV SV Index:   20 LVOT Area:     2.01 cm  RIGHT VENTRICLE            IVC RV Basal diam:  2.60 cm    IVC diam: 1.50 cm RV S prime:     6.09 cm/s TAPSE (M-mode): 0.7 cm LEFT ATRIUM             Index  RIGHT ATRIUM           Index LA diam:        3.30 cm 1.88 cm/m   RA Area:     12.90 cm LA Vol (A2C):   59.7 ml 34.07 ml/m  RA Volume:   28.40 ml  16.21 ml/m LA Vol (A4C):   59.7 ml 34.07 ml/m LA Biplane Vol: 61.6 ml 35.16 ml/m  AORTIC VALVE AV Area (Vmax):    0.95 cm AV Area (Vmean):   0.89 cm AV Area (VTI):     0.99 cm AV Vmax:           213.00 cm/s AV Vmean:          149.000 cm/s AV VTI:            0.361 m AV Peak Grad:      18.1 mmHg AV Mean Grad:      10.0 mmHg LVOT Vmax:         101.00 cm/s LVOT Vmean:        66.100 cm/s LVOT VTI:          0.178 m LVOT/AV VTI ratio: 0.49  AORTA Ao Asc diam: 2.70 cm MITRAL VALVE                TRICUSPID VALVE MV Area (PHT): 3.54 cm     TR Peak grad:   51.3 mmHg MV Area VTI:   0.99 cm     TR Vmax:        358.00 cm/s MV Peak grad:  13.2 mmHg MV Mean grad:  7.0 mmHg     SHUNTS MV Vmax:       1.82 m/s     Systemic VTI:  0.18 m MV Vmean:      125.0 cm/s   Systemic Diam: 1.60 cm MV Decel Time: 214 msec MV E velocity: 175.00 cm/s MV A velocity: 82.40 cm/s MV E/A ratio:  2.12 Vishnu Priya Mallipeddi Electronically signed by Lorelee Cover Mallipeddi Signature Date/Time: 11/12/2022/3:49:02 PM    Final    US Carotid Bilateral  Result Date: 11/11/2022 CLINICAL DATA:  Syncope EXAM: BILATERAL CAROTID DUPLEX ULTRASOUND TECHNIQUE: Pearline Cables scale imaging, color Doppler and duplex ultrasound were performed of bilateral carotid and vertebral arteries in the neck. COMPARISON:  None Available. FINDINGS: Criteria: Quantification of carotid stenosis is based on velocity parameters that  correlate the residual internal carotid diameter with NASCET-based stenosis levels, using the diameter of the distal internal carotid lumen as the denominator for stenosis measurement. The following velocity measurements were obtained: RIGHT ICA: 81/28 cm/sec CCA: [id number] cm/sec SYSTOLIC ICA/CCA RATIO:  1.4 ECA:  90 cm/sec LEFT ICA: 92/27 cm/sec CCA: [id number] cm/sec SYSTOLIC ICA/CCA RATIO:  1.3 ECA:  81 cm/sec RIGHT CAROTID ARTERY: Trace atherosclerotic plaque in the proximal internal carotid artery. By peak systolic velocity criteria, the estimated stenosis is less than 50%. RIGHT VERTEBRAL ARTERY:  Patent with normal antegrade flow. LEFT CAROTID ARTERY: Trace mild heterogeneous atherosclerotic plaque in the proximal internal carotid artery without evidence of stenosis. LEFT VERTEBRAL ARTERY:  Patent with normal antegrade flow. IMPRESSION: 1. Mild (1-49%) stenosis proximal right internal carotid artery secondary to trace heterogeneous atherosclerotic plaque. 2. No significant atherosclerotic plaque or evidence of stenosis in the left internal carotid artery. 3. Vertebral arteries are patent with normal antegrade flow. Signed, Criselda Peaches, MD, [facility] Rad[facility] Electronically Signed   By: Jacqulynn Cadet M.D.   On: 11/11/2022 16:35  Estill Cotta M.D. [facility] 11/13/2022, 11:05 AM  Available via Epic secure chat 7am-7pm After 7 pm, please refer to night coverage provider listed on amion.

## 2022-11-14 ENCOUNTER — Inpatient Hospital Stay (HOSPITAL_BASED_OUTPATIENT_CLINIC_OR_DEPARTMENT_OTHER)
Admit: 2022-11-14 | Discharge: 2022-11-14 | Disposition: A | Discharge status: Home / Self Care | Payer: Medicare PPO | Attending: Physician Assistant | Admitting: Physician Assistant

## 2022-11-14 ENCOUNTER — Other Ambulatory Visit: Payer: Self-pay | Admitting: Physician Assistant

## 2022-11-14 ENCOUNTER — Telehealth: Payer: Self-pay | Admitting: Physician Assistant

## 2022-11-14 DIAGNOSIS — I951 Orthostatic hypotension: Secondary | ICD-10-CM

## 2022-11-14 DIAGNOSIS — R55 Syncope and collapse: Secondary | ICD-10-CM | POA: Diagnosis not present

## 2022-11-14 DIAGNOSIS — I4892 Unspecified atrial flutter: Secondary | ICD-10-CM | POA: Diagnosis not present

## 2022-11-14 DIAGNOSIS — N1832 Chronic kidney disease, stage 3b: Secondary | ICD-10-CM | POA: Diagnosis not present

## 2022-11-14 DIAGNOSIS — E119 Type 2 diabetes mellitus without complications: Secondary | ICD-10-CM | POA: Diagnosis not present

## 2022-11-14 DIAGNOSIS — I5032 Chronic diastolic (congestive) heart failure: Secondary | ICD-10-CM

## 2022-11-14 LAB — BASIC METABOLIC PANEL
Anion gap: 11 (ref 5–15)
BUN: 18 mg/dL (ref 8–23)
CO2: 24 mmol/L (ref 22–32)
Calcium: 8.6 mg/dL — ABNORMAL LOW (ref 8.9–10.3)
Chloride: 99 mmol/L (ref 98–111)
Creatinine, Ser: 1.09 mg/dL — ABNORMAL HIGH (ref 0.44–1.00)
GFR, Estimated: 52 mL/min — ABNORMAL LOW (ref >60)
Glucose, Bld: 119 mg/dL — ABNORMAL HIGH (ref 70–99)
Potassium: 4 mmol/L (ref 3.5–5.1)
Sodium: 134 mmol/L — ABNORMAL LOW (ref 135–145)

## 2022-11-14 LAB — GLUCOSE, CAPILLARY
Glucose-Capillary: 131 mg/dL — ABNORMAL HIGH (ref 70–99)
Glucose-Capillary: 128 mg/dL — ABNORMAL HIGH (ref 70–99)

## 2022-11-14 LAB — PROTIME-INR
INR: 2.9 — ABNORMAL HIGH (ref 0.8–1.2)
Prothrombin Time: 30.1 seconds — ABNORMAL HIGH (ref 11.4–15.2)

## 2022-11-14 MED ORDER — PREDNISONE 10 MG PO TABS: ORAL_TABLET | ORAL | 0 refills | Status: DC

## 2022-11-14 MED ORDER — METOPROLOL TARTRATE 25 MG PO TABS
25.0000 mg | ORAL_TABLET | Freq: Two times a day (BID) | ORAL | Status: DC
  Administered 2022-11-14: 25 mg via ORAL
  Filled 2022-11-14: qty 1

## 2022-11-14 MED ORDER — POTASSIUM CHLORIDE CRYS ER 20 MEQ PO TBCR: 20.0000 meq | EXTENDED_RELEASE_TABLET | Freq: Every day | ORAL | 0 refills | Status: AC

## 2022-11-14 MED ORDER — METOPROLOL TARTRATE 25 MG PO TABS: 25.0000 mg | ORAL_TABLET | Freq: Two times a day (BID) | ORAL | 1 refills | Status: DC

## 2022-11-14 MED ORDER — TORSEMIDE 20 MG PO TABS: 20.0000 mg | ORAL_TABLET | Freq: Every day | ORAL | 2 refills | Status: DC

## 2022-11-14 MED ORDER — HYDROCODONE-ACETAMINOPHEN 5-325 MG PO TABS: 1.0000 | ORAL_TABLET | Freq: Four times a day (QID) | ORAL | 0 refills | Status: DC | PRN

## 2022-11-14 MED ORDER — PREDNISONE 20 MG PO TABS
40.0000 mg | ORAL_TABLET | Freq: Every day | ORAL | Status: DC
  Administered 2022-11-14: 40 mg via ORAL
  Filled 2022-11-14: qty 2

## 2022-11-14 MED ORDER — WARFARIN SODIUM 2.5 MG PO TABS: 2.5000 mg | ORAL_TABLET | Freq: Once | ORAL | Status: DC

## 2022-11-14 NOTE — TOC Transition Note (Signed)
Transition of Care [facility]) - CM/SW Discharge Note   Patient Details  Name: [name] MRN: [id number] Date of Birth: [birth year]  Transition of Care [facility]) CM/SW Contact:  [name], RN Phone Number: 11/14/2022, 11:57 AM   Clinical Narrative:    Patient is from home with spouse, NCM offered choice for HHPT, [place], they would like [facility].  NCM made referral to [facility] with [facility].  He is able to take referral.  Soc will begin 24 to 48 hrs post dc.  Spouse will transport patient home today. She has a sling for her shoulder.    Final next level of care: Home w Home Health Services Barriers to Discharge: No Barriers Identified   Patient Goals and CMS Choice CMS Medicare.gov Compare Post Acute Care list provided to:: Patient Represenative (must comment) Choice offered to / list presented to : Spouse  Discharge Placement                         Discharge Plan and Services Additional resources added to the After Visit Summary for   In-house Referral: NA Discharge Planning Services: CM Consult Post Acute Care Choice: Home Health          DME Arranged: N/A DME Agency: NA       HH Arranged: PT, OT, Nurse's Aide [place] Agency: [place] Date [place]: 11/14/22 Time [place]: [id number] Representative spoke with at [place]: [place] ([place]) Interventions SDOH Screenings   Food Insecurity: No Food Insecurity (11/11/2022)  Housing: Low Risk  (11/11/2022)  Transportation Needs: No Transportation Needs (11/11/2022)  Utilities: Not At Risk (11/11/2022)  Tobacco Use: Low Risk  (10/10/2022)     Readmission Risk Interventions    11/14/2022   11:52 AM 06/04/2021   11:00 AM 06/04/2021   10:03 AM  Readmission Risk Prevention Plan  Transportation Screening Complete Complete   PCP or Specialist Appt within 3-5 Days Complete    HRI or [place] Complete    Palliative Care Screening Not Applicable    Medication  Review (RN Care Manager) Complete  Complete  PCP or Specialist appointment within 3-5 days of discharge  Complete   HRI or [place]   Complete  SW Recovery Care/Counseling Consult   Complete  [place]   Not Applicable

## 2022-11-14 NOTE — Progress Notes (Signed)
Orthopedic Tech Progress Note Patient Details:  [name] 04/16/1946 [id number]  Ortho Devices Type of Ortho Device: Sling immobilizer Ortho Device/Splint Location: RUE Ortho Device/Splint Interventions: Ordered, Application, Adjustment   Post Interventions Patient Tolerated: Well, Fair Instructions Provided: Care of device  Janit Pagan 11/14/2022, 11:45 AM

## 2022-11-14 NOTE — Telephone Encounter (Signed)
Paged by [facility] with first documentation of atrial flutter.  Informed by rhythm that patient is in persistent atrial flutter, there were no longer notify us regarding the atrial flutter.  Will continue to monitor the patient for any bradycardia or tachycardia that could be responsible for her passing out spell

## 2022-11-14 NOTE — TOC Initial Note (Signed)
Transition of Care [facility]) - Initial/Assessment Note    Patient Details  Name: [name] MRN: [id number] Date of Birth: 1946-02-13  Transition of Care [facility]) CM/SW Contact:    [name], RN Phone Number: 11/14/2022, 11:55 AM  Clinical Narrative:                 Patient is from home with spouse, NCM offered choice for HHPT, [place], they would like [facility].  NCM made referral to [facility] with [facility].  He is able to take referral.  Soc will begin 24 to 48 hrs post dc.  Spouse will transport patient home today. She has a sling for her shoulder.   Expected Discharge Plan: [place] Barriers to Discharge: No Barriers Identified   Patient Goals and CMS Choice Patient states their goals for this hospitalization and ongoing recovery are:: return home CMS Medicare.gov Compare Post Acute Care list provided to:: Patient Represenative (must comment) Choice offered to / list presented to : Spouse      Expected Discharge Plan and Services In-house Referral: NA Discharge Planning Services: CM Consult Post Acute Care Choice: Home Health Living arrangements for the past 2 months: Single Family Home Expected Discharge Date: 11/14/22               DME Arranged: N/A DME Agency: NA       HH Arranged: PT, OT, Nurse's Aide HH Agency: [place] Date [facility] Agency Contacted: 11/14/22 Time [place]: 69 Representative spoke with at [place]: Tommi Rumps  Prior Living Arrangements/Services Living arrangements for the past 2 months: [place] with:: Spouse Patient language and need for interpreter reviewed:: Yes Do you feel safe going back to the place where you live?: Yes      Need for Family Participation in Patient Care: Yes (Comment) Care giver support system in place?: Yes (comment)   Criminal Activity/Legal Involvement Pertinent to Current Situation/Hospitalization: No - Comment as needed  Activities of Daily Living Home  Assistive Devices/Equipment: None ADL Screening (condition at time of admission) Patient's cognitive ability adequate to safely complete daily activities?: Yes Is the patient deaf or have difficulty hearing?: No Does the patient have difficulty seeing, even when wearing glasses/contacts?: No Does the patient have difficulty concentrating, remembering, or making decisions?: No Patient able to express need for assistance with ADLs?: Yes Does the patient have difficulty dressing or bathing?: No Independently performs ADLs?: Yes (appropriate for developmental age) Does the patient have difficulty walking or climbing stairs?: No Weakness of Legs: None Weakness of Arms/Hands: None  Permission Sought/Granted                  Emotional Assessment Appearance:: Appears stated age Attitude/Demeanor/Rapport: Engaged Affect (typically observed): Appropriate Orientation: : Oriented to Self, Oriented to Place, Oriented to  Time, Oriented to Situation Alcohol / Substance Use: Not Applicable Psych Involvement: No (comment)  Admission diagnosis:  Syncope [R55] Atrial flutter, chronic (HCC) [I48.92] Syncope, unspecified syncope type [R55] Stage 3b chronic kidney disease ([place]) [N18.32] Patient Active Problem List   Diagnosis Date Noted   Syncope 11/11/2022   Acute respiratory failure with hypoxia ([place]) 03/31/2022   Acute congestive heart failure ([place]) 03/31/2022   Pneumonia 03/31/2022   RSV infection 06/03/2021   COPD with acute exacerbation ([place]) 06/01/2021   Type 2 diabetes mellitus treated without insulin ([place]) 06/01/2021   COPD exacerbation ([place]) 06/01/2021   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   Asthma exacerbation 11/12/2016  Synovial cyst of lumbar spine 05/13/2014   Encounter for therapeutic drug monitoring 11/06/2013   Hyponatremia    Atrial fibrillation (HCC)    Moderate aortic stenosis 07/12/2013   Moderate aortic insufficiency 07/12/2013   Status post aortic valve and  mitral valve replacement 07/12/2013   Severe mitral valve stenosis 07/09/2013   Mitral stenosis 07/08/2013   Chest pain 07/07/2013   Asthma, cough variant 03/27/2013   Upper airway cough syndrome in pt with mild cough variant asthma  09/17/2012   Dyspnea on exertion 06/13/2012   Edema 06/13/2012   Rheumatic heart disease 06/13/2012   Fasting hyperglycemia    Chronic anticoagulation 01/26/2012   Hypothyroidism [id number]   Cardioembolic stroke ([place]) [id number]   Hyperlipidemia    Essential hypertension    Breast carcinoma ([place])    Gastroesophageal reflux disease    PCP:  [name], MD Pharmacy:   [place], [place] [id number] [place] [id number] Phone: [phone] Fax: [phone]     Social Determinants of Health (SDOH) Social History: SDOH Screenings   Food Insecurity: No Food Insecurity (11/11/2022)  Housing: Low Risk  (11/11/2022)  Transportation Needs: No Transportation Needs (11/11/2022)  Utilities: Not At Risk (11/11/2022)  Tobacco Use: Low Risk  (10/10/2022)   SDOH Interventions:     Readmission Risk Interventions    11/14/2022   11:52 AM 06/04/2021   11:00 AM 06/04/2021   10:03 AM  Readmission Risk Prevention Plan  Transportation Screening Complete Complete   PCP or Specialist Appt within 3-5 Days Complete    HRI or [place] Complete    Palliative Care Screening Not Applicable    Medication Review (RN Care Manager) Complete  Complete  PCP or Specialist appointment within 3-5 days of discharge  Complete   HRI or [place]   Complete  SW Recovery Care/Counseling Consult   Complete  [place]   Not Applicable

## 2022-11-14 NOTE — Evaluation (Signed)
Occupational Therapy Evaluation Patient Details Name: [name] MRN: [id number] DOB: July 27, 1946 Today's Date: 11/14/2022   History of Present Illness Patient is a 77 y.o. female who presented to the ED after 2 syncopal episodes on the morning of admission, both of which were witnessed; pt amnestic to syncopal episodes, her main concern is her R shoulder pain; asthma/COPD, atrial fibrillation on Coumadin, prior CVA, prior aortic and mitral valve replacement, hypothyroidism, DM type II   Clinical Impression   Patient admitted for the diagnosis above.  PTA she lives with her spouse, who does assist as needed for ADL and iADL.  She does use a quad cane on occasion for mobility.   Patient biggest deficit is R shoulder pain, and this is impacting her functional status.  The patient is HHA of one for mobility, and is needing Mod A for lower body ADL.  OT will follow in the acute setting to address deficits listed below, and assist with eventual transition home.  Out patient shoulder rehab may be needed post cortisone injection.       Recommendations for follow up therapy are one component of a multi-disciplinary discharge planning process, led by the attending physician.  Recommendations may be updated based on patient status, additional functional criteria and insurance authorization.   Follow Up Recommendations  Other (comment) (Patient with partial thickness R RCT.)     Assistance Recommended at Discharge    Patient can return home with the following A little help with walking and/or transfers;A little help with bathing/dressing/bathroom;Assist for transportation;Assistance with cooking/housework    Functional Status Assessment  Patient has had a recent decline in their functional status and demonstrates the ability to make significant improvements in function in a reasonable and predictable amount of time.  Equipment Recommendations  None recommended by OT    Recommendations for Other  Services       Precautions / Restrictions Precautions Precautions: Fall Restrictions Weight Bearing Restrictions: No      Mobility Bed Mobility Overal bed mobility: Needs Assistance Bed Mobility: Supine to Sit, Sit to Supine     Supine to sit: Supervision Sit to supine: Supervision        Transfers Overall transfer level: Needs assistance Equipment used: 1 person hand held assist Transfers: Sit to/from Stand, Bed to chair/wheelchair/BSC Sit to Stand: Supervision     Step pivot transfers: Min guard            Balance Overall balance assessment: Needs assistance Sitting-balance support: Feet supported Sitting balance-Leahy Scale: Good     Standing balance support: Single extremity supported Standing balance-Leahy Scale: Fair                             ADL either performed or assessed with clinical judgement   ADL       Grooming: Wash/dry hands;Wash/dry face;Standing;Min guard               Lower Body Dressing: Moderate assistance;Sit to/from stand   Toilet Transfer: Nature conservation officer;Ambulation                   Vision Baseline Vision/History: 1 Wears glasses Patient Visual Report: No change from baseline       Perception     Praxis      Pertinent Vitals/Pain Pain Assessment Pain Assessment: Faces Faces Pain Scale: Hurts even more Pain Location: R shoulder Pain Descriptors / Indicators: Constant, Grimacing, Guarding Pain Intervention(s): Monitored  during session     Hand Dominance Right   Extremity/Trunk Assessment Upper Extremity Assessment Upper Extremity Assessment: RUE deficits/detail RUE: Shoulder pain with ROM RUE Sensation: WNL RUE Coordination: WNL   Lower Extremity Assessment Lower Extremity Assessment: Defer to PT evaluation   Cervical / Trunk Assessment Cervical / Trunk Assessment: Normal   Communication Communication Communication: No difficulties   Cognition Arousal/Alertness:  Awake/alert Behavior During Therapy: Flat affect Overall Cognitive Status: Within Functional Limits for tasks assessed                                                        Home Living Family/patient expects to be discharged to:: Private residence Living Arrangements: Spouse/significant other Available Help at Discharge: Family;Available PRN/intermittently;Available 24 hours/day Type of Home: House Home Access: Stairs to enter [employer] of Steps: 4 Entrance Stairs-Rails: Right;Left;Can reach both Home Layout: Two level;Able to live on main level with bedroom/bathroom     Bathroom Shower/Tub: Occupational psychologist: Handicapped height Bathroom Accessibility: Yes   Home Equipment: [place] - quad;Shower seat;Grab bars - tub/shower;Toilet riser          Prior Functioning/Environment Prior Level of Function : Needs assist             Mobility Comments: Patient was able to ambulate at home and in the community independently with quad cane. Not currently driving ADLs Comments: Ind with ADL, and spouse assists with iADL and community mobility.        OT Problem List: Pain;Impaired balance (sitting and/or standing)      OT Treatment/Interventions: Self-care/ADL training;Therapeutic exercise;Therapeutic activities;Patient/family education;Balance training    OT Goals(Current goals can be found in the care plan section) Acute Rehab OT Goals Patient Stated Goal: Return home OT Goal Formulation: With patient Time For Goal Achievement: 11/28/22 Potential to Achieve Goals: Good ADL Goals Pt Will Perform Grooming: with set-up;standing Pt Will Perform Lower Body Dressing: with set-up;sit to/from stand Pt Will Transfer to Toilet: with modified independence;ambulating;regular height toilet  OT Frequency: Min 2X/week    Co-evaluation              AM-PAC OT "6 Clicks" Daily Activity     Outcome Measure Help from another person  eating meals?: None Help from another person taking care of personal grooming?: None Help from another person toileting, which includes using toliet, bedpan, or urinal?: A Lot Help from another person bathing (including washing, rinsing, drying)?: A Lot Help from another person to put on and taking off regular upper body clothing?: A Lot Help from another person to put on and taking off regular lower body clothing?: A Lot 6 Click Score: 16   End of Session Equipment Utilized During Treatment: Gait belt Nurse Communication: Mobility status  Activity Tolerance: Patient tolerated treatment well Patient left: in bed;with call bell/phone within reach;with family/visitor present  OT Visit Diagnosis: Unsteadiness on feet (R26.81)                Time: 0950-1010 OT Time Calculation (min): 20 min Charges:  OT General Charges $OT Visit: 1 Visit OT Evaluation $OT Eval Moderate Complexity: 1 Mod  11/14/2022  RP, OTR/L  Acute Rehabilitation Services  Office:  [phone]   Metta Clines 11/14/2022, 10:38 AM

## 2022-11-14 NOTE — Consult Note (Signed)
   [name] Inpatient Consult   11/14/2022  [name] November 15, 1945 [id number]  [place] [facility] [ACO] Patient: [name] PPO  Primary Care Provider:  [name], MD   Patient screened for hospitalization with noted high risk score for unplanned readmission risk and to assess for potential [place] Management service needs for post hospital transition for care coordination.  Review of patient's electronic medical record reveals patient is for home with [place]   1300: Came by to speak with patient and is currently in patient care and receiving discharge instructions and personal care to transport home. Chart reviewed for needs   Plan:  Referral request for community care coordination: none assessed from inpatient [facility] notes and nursing notes, patient had transitioned home on round.  Of note, [facility] Care Management/Population Health does not replace or interfere with any arrangements made by the Inpatient Transition of Care team.  For questions contact:   Natividad Brood, RN BSN [place]  [phone] business mobile phone Toll free office [phone]  *[place]  [phone] Fax number: [phone] [email or web address]

## 2022-11-14 NOTE — Progress Notes (Signed)
ANTICOAGULATION CONSULT NOTE - Follow Up Consult  Pharmacy Consult for Warfarin Indication: atrial fibrillation and hx bioprosthetic AVR and MVR, prior CVA  Allergies  Allergen Reactions   Amiodarone Rash    Patient Measurements: Height: 5' 2"$  (157.5 cm) Weight: 75.7 kg (166 lb 12.8 oz) IBW/kg (Calculated) : 50.1  Vital Signs: Temp: 98.8 F (37.1 C) (02/12 0734) Temp Source: Oral (02/12 0734) BP: 106/59 (02/12 0734) Pulse Rate: 109 (02/12 0734)  Labs: Recent Labs    11/12/22 0029 11/13/22 0032 11/14/22 0033  HGB 12.9  --   --   HCT 39.4  --   --   PLT 158  --   --   LABPROT 34.4* 28.0* 30.1*  INR 3.4* 2.6* 2.9*  CREATININE 1.39* 1.01* 1.09*    Estimated Creatinine Clearance: 40.5 mL/min (A) (by C-G formula based on SCr of 1.09 mg/dL (H)).  Assessment: 77 yr old female on warfarin prior to admission for atrial fibrillation. Hx bioprosthetic AVR and MVR and hx CVA. To continue warfarin per Cardiology recommendation.  Pharmacy consulted for warfarin dosing while inpatient.  INR is therapeutic (2.9).  INR 3.6 on admit 2/9 and lower warfarin doses given 2/9 and 2/10.  INR 2.6 on 2/11 and usual dose of warfarin 3.75 mg given. No further INR decline today.  CBC stable 2/10.  PTA warfarin regimen: 3.75 mg on Sundays and Thursdays, 2.5 mg all other days.  Goal of Therapy:  INR 2.5-3.5  per anticoagulation clinic Monitor platelets by anticoagulation protocol: Yes   Plan:  Warfarin 2.5 mg x 1 today, usual Monday dose. Daily PT/INR.  Intermittent CBC, will check in am. Monitor for signs/symptoms of bleeding.  Arty Baumgartner, [place] 11/14/2022,9:42 AM

## 2022-11-14 NOTE — Discharge Summary (Signed)
Physician Discharge Summary   Patient: [name] MRN: [id number] DOB: [birth year]  Admit date:     11/11/2022  Discharge date: 11/14/22  Discharge Physician: Estill Cotta, MD    PCP: [name], MD   Recommendations at discharge:   Resumed metoprolol 25 mg twice daily Cardizem, olmesartan held. Per cardiology recommendations to resume torsemide 20 mg daily at discharge 14 day ZIO to be arranged by cardiology Per orthopedics medications, started on prednisone 40 mg daily for 6 days followed by taper, right arm sling Outpatient follow-up with Dr. Griffin Basil in 2 weeks  Discharge Diagnoses:    Syncope with atrial flutter and bradycardia Right shoulder moderate tendinosis of the supraspinatus tendon, tiny partial-thickness articular surface tear Atrial flutter with RVR   Hyperlipidemia   Essential hypertension   Hypothyroidism   Chronic anticoagulation   Status post aortic valve and mitral valve replacement   Type 2 diabetes mellitus treated without insulin [facility])   Hospital Course:  Patient is a 77 y.o. female with asthma/COPD, atrial fibrillation on Coumadin, prior CVA, prior aortic and mitral valve replacement, hypothyroidism, DM type II presented to the ED after 2 syncopal episodes on the morning of admission, both of which were witnessed.  She had some dizziness and weakness with nausea and vomiting.  She has some chronic right shoulder pain with history of rotator cuff pain.  EMS was called and patient was brought to the ED for further evaluation.  Husband at bedside states that she has had poor appetite recently and has not been eating or drinking as much as usual.  In ED, initial bradycardia in the 50-60 bpm range and EKG showing atrial flutter with 4:1 block and cardiology consulted  Creatinine 1.39 and elevated from usual baseline of 0.8-1.1.  CT head and neck with no acute findings and chest x-ray and shoulder films with no acute findings.  Patient was transferred to [facility] for further workup and EP evaluation  Assessment and Plan:   Syncope in the setting of new arrhythmia/bradycardia -Patient has a history of chronic atrial flutter on metoprolol and diltiazem.  EKG on admission showed A-fib flutter with 4:1 block, heart rate in 60s. -Cardiology was consulted, recommended to hold AV nodal agents and continue Coumadin, EP evaluation.  Patient was transferred to [facility] for further workup. -2D echo showed EF of 55 to 60%, mild LVH, indeterminate diastolic filling.  Moderately elevated PA pressure 54.3 mmHg -Carotid Dopplers mild 1 to 49% stenosis proximal right ICA, no significant atherosclerotic plaque or stenosis in left ICA.   Active Problems: History of chronic atrial flutter, now in atrial flutter with RVR -Metoprolol, diltiazem initially were held on admission due to bradycardia and 4:1 block -EP cardiology consulted, recommended checking orthostatics and resuming beta-blocker. Started on metoprolol titrate 25 mg twice daily, cardiology recommended to hold diltiazem and ARB.  Resume torsemide 20 mg daily at discharge. Zio patch 14-day will be managed by cardiology and outpatient follow-up in 1 to 2 weeks.      History of rheumatic mitral valve disease with moderate MS s/p close mitral valve valvotomy, bioprosthetic MVR History of rheumatic aortic valve disease with moderate AS and regurg s/p bioprosthetic AVR in 2014 -Continue Coumadin.  (Per cardiology, now that she has MVR, it is okay to switch to Eliquis but due to prior history of CVA, recommended to continue Coumadin) - -2D echo showed EF of 55 to 60%, mild LVH, indeterminate diastolic filling.  Moderately elevated PA pressure 54.3 mmHg  Acute kidney injury -Baseline creatinine 0.8-1.1. -Likely renal secondary to poor appetite recently with nausea and vomiting -Creatinine 1.3, on admission, was placed on enteral hydration.  -Torsemide, benicar was held.  Creatinine now normal, -Per  cardiology continue torsemide and hold ARB -Creatinine 1.0 at discharge.   Chronic diastolic CHF -Prior 2D echo in 03/2022 had shown EF of 60%, normal chronic HFpEF -2D echo with EF 55 to 60%, mild LVH, indeterminate diastolic filling -Resume torsemide 20 mg daily    Acute on chronic right shoulder pain - x-ray negative for any fracture or dislocation --MRI right shoulder showed moderate tendinosis of the supraspinatus tendon with tiny partial-thickness articular surface tear -Orthopedics was consulted, recommended prednisone 40 mg daily for 1 week then followed by taper, outpatient follow-up with Dr.Varkey inn 2 weeks    Diabetes mellitus type 2, NIDDM, with dyslipidemia -Hemoglobin A1c 7.1 11/11/2022 -Continue metformin   Essential hypertension Resumed beta-blocker, torsemide.  BP stable   Hypothyroidism -TSH 4.932 continue levothyroxine     Pain control - [facility] Controlled Substance Reporting System database was reviewed. and patient was instructed, not to drive, operate heavy machinery, perform activities at heights, swimming or participation in water activities or provide baby-sitting services while on Pain, Sleep and Anxiety Medications; until their outpatient Physician has advised to do so again. Also recommended to not to take more than prescribed Pain, Sleep and Anxiety Medications.  Consultants: Cardiology, orthopedics Procedures performed: None Disposition: Home Diet recommendation:  Discharge Diet Orders (From admission, onward)     Start     Ordered   11/14/22 0000  Diet Carb Modified        11/14/22 1145           Carb modified diet DISCHARGE MEDICATION: Allergies as of 11/14/2022       Reactions   Amiodarone Rash        Medication List     STOP taking these medications    diltiazem 180 MG 24 hr capsule Commonly known as: CARDIZEM CD   olmesartan 5 MG tablet Commonly known as: BENICAR       TAKE these medications    albuterol (2.5  MG/3ML) 0.083% nebulizer solution Commonly known as: PROVENTIL Take 3 mLs (2.5 mg total) by nebulization every 4 (four) hours as needed for wheezing or shortness of breath. What changed: when to take this   albuterol 108 (90 Base) MCG/ACT inhaler Commonly known as: VENTOLIN HFA Inhale 1-2 puffs into the lungs every 6 (six) hours as needed for wheezing or shortness of breath. What changed: Another medication with the same name was changed. Make sure you understand how and when to take each.   atorvastatin 40 MG tablet Commonly known as: LIPITOR TAKE ONE TABLET BY MOUTH ONCE DAILY.   budesonide 0.5 MG/2ML nebulizer solution Commonly known as: PULMICORT USE 1/2 VIAL IN NEBULIZER TWICE DAILY. DISCARD UNUSED PORTION AFTER EACH USE. What changed:  how much to take how to take this when to take this   cetirizine 10 MG tablet Commonly known as: ZYRTEC Take 10 mg by mouth daily.   dextromethorphan-guaiFENesin 30-600 MG 12hr tablet Commonly known as: MUCINEX DM Take 1 tablet by mouth 2 (two) times daily as needed for cough.   escitalopram 10 MG tablet Commonly known as: LEXAPRO Take 10 mg by mouth daily.   famotidine 20 MG tablet Commonly known as: PEPCID TAKE 1 TABLET AFTER SUPPER What changed: See the new instructions.   fluticasone 50 MCG/ACT nasal spray Commonly known  as: FLONASE Place 1 spray into both nostrils daily.   gabapentin 100 MG capsule Commonly known as: NEURONTIN TAKE 1 CAPSULE BY MOUTH FOUR TIMES DAILY.   HYDROcodone-acetaminophen 5-325 MG tablet Commonly known as: NORCO/VICODIN Take 1 tablet by mouth every 6 (six) hours as needed for moderate pain or severe pain.   levothyroxine 100 MCG tablet Commonly known as: SYNTHROID Take 100 mcg by mouth daily before breakfast.   metFORMIN 500 MG tablet Commonly known as: GLUCOPHAGE Take 1 tablet (500 mg total) by mouth 2 (two) times daily with a meal.   metoprolol tartrate 25 MG tablet Commonly known as:  LOPRESSOR Take 1 tablet (25 mg total) by mouth 2 (two) times daily. What changed: how much to take   multivitamin with minerals Tabs tablet Take 1 tablet by mouth daily.   pantoprazole 40 MG tablet Commonly known as: PROTONIX TAKE 1 TABLET 30 TO 60 MINUTES BEFORE FIRST MEAL OF THE DAY. What changed: See the new instructions.   potassium chloride SA 20 MEQ tablet Commonly known as: KLOR-CON M Take 1 tablet (20 mEq total) by mouth daily. What changed: when to take this   predniSONE 10 MG tablet Commonly known as: DELTASONE Prednisone dosing: Take  Prednisone 15m (4 tabs) x 6 days, then taper to 379m(3 tabs) x 3 days, then 2037m2 tabs) x 3days, then 41m70m tab) x 3days, then OFF.   tiZANidine 2 MG tablet Commonly known as: ZANAFLEX Take 2 mg by mouth 2 (two) times daily as needed for muscle spasms.   torsemide 20 MG tablet Commonly known as: DEMADEX Take 1 tablet (20 mg total) by mouth daily. What changed: See the new instructions.   warfarin 2.5 MG tablet Commonly known as: COUMADIN Take as directed. If you are unsure how to take this medication, talk to your nurse or doctor. Original instructions: TAKE AS DIRECTED BY COUMADIN CLINIC. What changed: See the new instructions.        Follow-up Information     VarkHiram Gash. Schedule an appointment as soon as possible for a visit in 2 week(s).   Specialty: Orthopedic Surgery Why: for hospital follow-up Contact information: [address] [phone]         KleiDeboraha Sprang. Schedule an appointment as soon as possible for a visit in 2 week(s).   Specialty: Cardiology Why: for hospital follow-up Contact information: [phone]         FagaAsencion Noble. Schedule an appointment as soon as possible for a visit in 2 week(s).   Specialty: Internal Medicine Why: for hospital follow-up Contact information: [phone] [phone]            Discharge Exam: FileDanley Dankerghts   11/12/22 0509 11/13/22 0612 11/14/22 0609  Weight: 73.9 kg 74.5 kg 75.7 kg   S: Right shoulder pain, otherwise no bradycardia.  Heart rate somewhat elevated.  No chest pain, palpitations, shortness of breath.  Husband at the bedside.  Physical Exam General: Alert and oriented x 3, NAD Cardiovascular: S1 S2 clear, no murmurs Respiratory: CTAB, no wheezing, rales or rhonchi Gastrointestinal: Soft, nontender, nondistended, NBS Ext: no pedal edema bilaterally, decreased ROM right shoulder due to pain Neuro: no new deficits Psych: Normal affect   Condition at discharge: fair  The results of significant diagnostics from this hospitalization (including imaging, microbiology, ancillary  and laboratory) are listed below for reference.   Imaging Studies: MR SHOULDER RIGHT WO CONTRAST  Result Date: 11/13/2022 CLINICAL DATA:  Chronic shoulder pain. EXAM: MRI OF THE RIGHT SHOULDER WITHOUT CONTRAST TECHNIQUE: Multiplanar, multisequence MR imaging of the shoulder was performed. No intravenous contrast was administered. COMPARISON:  None Available. FINDINGS: Rotator cuff: Moderate tendinosis of the supraspinatus tendon with a tiny partial-thickness articular surface tear. Mild tendinosis of the infraspinatus tendon. Teres minor tendon is intact. Subscapularis tendon is intact. Muscles: No muscle atrophy or edema. No intramuscular fluid collection or hematoma. Biceps Long Head: Mild tendinosis of the intra-articular portion of the long head of the biceps tendon. Acromioclavicular Joint: Moderate arthropathy of the acromioclavicular joint. No subacromial/subdeltoid bursal fluid. Glenohumeral Joint: No joint effusion. Partial-thickness cartilage loss of the glenohumeral joint. Labrum: Superior labral degeneration. Bones: No fracture or dislocation. No aggressive osseous lesion. Other: No fluid collection  or hematoma. IMPRESSION: 1. Moderate tendinosis of the supraspinatus tendon with a tiny partial-thickness articular surface tear. 2. Mild tendinosis of the infraspinatus tendon. 3. Mild tendinosis of the intra-articular portion of the long head of the biceps tendon. 4. 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   On: 11/13/2022 14:38   ECHOCARDIOGRAM COMPLETE  Result Date: 11/12/2022    ECHOCARDIOGRAM REPORT   Patient Name:   [name] Date of Exam: 11/12/2022 Medical Rec #:  [id number]       Height:       62.0 in Accession #:    [id number]      Weight:       162.9 lb Date of Birth:  15-Sep-1946       BSA:          1.752 m Patient Age:    10 years        BP:           144/89 mmHg Patient Gender: F               HR:           103 bpm. Exam Location:  Inpatient Procedure: 2D Echo, Color Doppler and Cardiac Doppler Indications:    syncope  History:        Patient has prior history of Echocardiogram examinations, most                 recent 04/01/2022. COPD, Arrythmias:Atrial Fibrillation,                 Signs/Symptoms:Syncope; Risk Factors:Hypertension, Dyslipidemia                 and Diabetes.                 Aortic Valve: 21 mm Magna Ease valve is present in the aortic                 position. Procedure Date: 2014.                 Mitral Valve: 25 mm Magna Ease bioprosthetic valve valve is                 present in the mitral position. Procedure Date: 2014.  Sonographer:    Johny Chess RDCS Referring Phys: [id number] [place] D [place]  1. Left ventricular ejection fraction, by estimation, is 55 to 60%. The left ventricle has normal function. The left ventricle has no regional wall motion abnormalities. There is mild left ventricular  hypertrophy. Indeterminate diastolic filling due to E-A fusion.  2. Right ventricular systolic function was not well visualized. The right ventricular size is normal. There is moderately elevated  pulmonary artery systolic pressure. The estimated right ventricular systolic pressure is [id number] mmHg.  3. Left atrial size was mildly dilated.  4. The mitral valve has been repaired/replaced. No evidence of mitral valve regurgitation. No evidence of mitral stenosis. The mean mitral valve gradient is 7.0 mmHg at HR of 105 bpm. There is a 25 mm Magna Ease bioprosthetic valve present in the mitral  position. Procedure Date: 2014. Grossly normal function.  5. The aortic valve has been repaired/replaced. Aortic valve regurgitation is not visualized. No aortic stenosis is present. There is a 21 mm Magna Ease valve present in the aortic position. Procedure Date: 2014. Aortic valve mean gradient measures 10.0  mmHg. Aortic valve Vmax measures 2.13 m/s. Grossly normal function.  6. The inferior vena cava is normal in size with greater than 50% respiratory variability, suggesting right atrial pressure of 3 mmHg. Comparison(s): No significant change from prior study. FINDINGS  Left Ventricle: Left ventricular ejection fraction, by estimation, is 55 to 60%. The left ventricle has normal function. The left ventricle has no regional wall motion abnormalities. The left ventricular internal cavity size was normal in size. There is  mild left ventricular hypertrophy. Indeterminate diastolic filling due to E-A fusion. Right Ventricle: The right ventricular size is normal. No increase in right ventricular wall thickness. Right ventricular systolic function was not well visualized. There is moderately elevated pulmonary artery systolic pressure. The tricuspid regurgitant velocity is 3.58 m/s, and with an assumed right atrial pressure of 3 mmHg, the estimated right ventricular systolic pressure is [id number] mmHg. Left Atrium: Left atrial size was mildly dilated. Right Atrium: Right atrial size was normal in size. Pericardium: There is no evidence of pericardial effusion. Mitral Valve: The mitral valve has been repaired/replaced. No evidence of  mitral valve regurgitation. There is a 25 mm Magna Ease bioprosthetic valve present in the mitral position. Procedure Date: 2014. No evidence of mitral valve stenosis. MV peak gradient, 13.2 mmHg. The mean mitral valve gradient is 7.0 mmHg. Tricuspid Valve: The tricuspid valve is normal in structure. Tricuspid valve regurgitation is mild . No evidence of tricuspid stenosis. Aortic Valve: The aortic valve has been repaired/replaced. Aortic valve regurgitation is not visualized. No aortic stenosis is present. Aortic valve mean gradient measures 10.0 mmHg. Aortic valve peak gradient measures 18.1 mmHg. Aortic valve area, by VTI measures 0.99 cm. There is a 21 mm Magna Ease valve present in the aortic position. Procedure Date: 2014. Pulmonic Valve: The pulmonic valve was normal in structure. Pulmonic valve regurgitation is trivial. No evidence of pulmonic stenosis. Aorta: The aortic root is normal in size and structure. Venous: The inferior vena cava is normal in size with greater than 50% respiratory variability, suggesting right atrial pressure of 3 mmHg. IAS/Shunts: No atrial level shunt detected by color flow Doppler.  LEFT VENTRICLE PLAX 2D LVIDd:         2.90 cm LVIDs:         2.10 cm LV PW:         1.20 cm LV IVS:        0.90 cm LVOT diam:     1.60 cm LV SV:         36 LV SV Index:   20 LVOT Area:     2.01 cm  RIGHT VENTRICLE  IVC RV Basal diam:  2.60 cm    IVC diam: 1.50 cm RV S prime:     6.09 cm/s TAPSE (M-mode): 0.7 cm LEFT ATRIUM             Index        RIGHT ATRIUM           Index LA diam:        3.30 cm 1.88 cm/m   RA Area:     12.90 cm LA Vol (A2C):   59.7 ml 34.07 ml/m  RA Volume:   28.40 ml  16.21 ml/m LA Vol (A4C):   59.7 ml 34.07 ml/m LA Biplane Vol: 61.6 ml 35.16 ml/m  AORTIC VALVE AV Area (Vmax):    0.95 cm AV Area (Vmean):   0.89 cm AV Area (VTI):     0.99 cm AV Vmax:           213.00 cm/s AV Vmean:          149.000 cm/s AV VTI:            0.361 m AV Peak Grad:      18.1 mmHg  AV Mean Grad:      10.0 mmHg LVOT Vmax:         101.00 cm/s LVOT Vmean:        66.100 cm/s LVOT VTI:          0.178 m LVOT/AV VTI ratio: 0.49  AORTA Ao Asc diam: 2.70 cm MITRAL VALVE                TRICUSPID VALVE MV Area (PHT): 3.54 cm     TR Peak grad:   51.3 mmHg MV Area VTI:   0.99 cm     TR Vmax:        358.00 cm/s MV Peak grad:  13.2 mmHg MV Mean grad:  7.0 mmHg     SHUNTS MV Vmax:       1.82 m/s     Systemic VTI:  0.18 m MV Vmean:      125.0 cm/s   Systemic Diam: 1.60 cm MV Decel Time: 214 msec MV E velocity: 175.00 cm/s MV A velocity: 82.40 cm/s MV E/A ratio:  2.12 Vishnu Priya Mallipeddi Electronically signed by Lorelee Cover Mallipeddi Signature Date/Time: 11/12/2022/3:49:02 PM    Final    US Carotid Bilateral  Result Date: 11/11/2022 CLINICAL DATA:  Syncope EXAM: BILATERAL CAROTID DUPLEX ULTRASOUND TECHNIQUE: Pearline Cables scale imaging, color Doppler and duplex ultrasound were performed of bilateral carotid and vertebral arteries in the neck. COMPARISON:  None Available. FINDINGS: Criteria: Quantification of carotid stenosis is based on velocity parameters that correlate the residual internal carotid diameter with NASCET-based stenosis levels, using the diameter of the distal internal carotid lumen as the denominator for stenosis measurement. The following velocity measurements were obtained: RIGHT ICA: 81/28 cm/sec CCA: [id number] cm/sec SYSTOLIC ICA/CCA RATIO:  1.4 ECA:  90 cm/sec LEFT ICA: 92/27 cm/sec CCA: [id number] cm/sec SYSTOLIC ICA/CCA RATIO:  1.3 ECA:  81 cm/sec RIGHT CAROTID ARTERY: Trace atherosclerotic plaque in the proximal internal carotid artery. By peak systolic velocity criteria, the estimated stenosis is less than 50%. RIGHT VERTEBRAL ARTERY:  Patent with normal antegrade flow. LEFT CAROTID ARTERY: Trace mild heterogeneous atherosclerotic plaque in the proximal internal carotid artery without evidence of stenosis. LEFT VERTEBRAL ARTERY:  Patent with normal antegrade flow. IMPRESSION: 1. Mild (1-49%)  stenosis proximal right internal carotid artery secondary to trace heterogeneous atherosclerotic plaque. 2. No significant  atherosclerotic plaque or evidence of stenosis in the left internal carotid artery. 3. Vertebral arteries are patent with normal antegrade flow. Signed, Criselda Peaches, MD, [facility] Electronically Signed   By: Jacqulynn Cadet M.D.   On: 11/11/2022 16:35   DG Shoulder Right  Result Date: 11/11/2022 CLINICAL DATA:  Syncope.  Fall.  Right shoulder pain. EXAM: RIGHT SHOULDER - 2+ VIEW COMPARISON:  None Available. FINDINGS: Three views are submitted. The bones appear mildly demineralized. No evidence of acute fracture or dislocation. Mild acromioclavicular degenerative changes. Status post median sternotomy and cardiac dual valve replacement. IMPRESSION: No evidence of acute fracture or dislocation. Electronically Signed   By: Richardean Sale M.D.   On: 11/11/2022 13:06   DG Chest Port 1 View  Result Date: 11/11/2022 CLINICAL DATA:  Dizziness and weakness associated with nausea and vomiting as well as chronic right shoulder pain. Recent witnessed falls. EXAM: PORTABLE CHEST 1 VIEW COMPARISON:  Chest radiograph dated 10/10/2022 FINDINGS: Normal lung volumes. No focal consolidations. No pleural effusion or pneumothorax. Similar enlarged, postsurgical cardiomediastinal silhouette allowing for differences in technique. Median sternotomy wires are nondisplaced. IMPRESSION: 1. No acute cardiopulmonary process. 2. Similar enlarged, postsurgical cardiomediastinal silhouette. Electronically Signed   By: Darrin Nipper M.D.   On: 11/11/2022 09:53   CT Cervical Spine Wo Contrast  Result Date: 11/11/2022 CLINICAL DATA:  Head/neck trauma. Dizziness and weakness. Unremarkable. EXAM: CT HEAD WITHOUT CONTRAST CT CERVICAL SPINE WITHOUT CONTRAST TECHNIQUE: Multidetector CT imaging of the head and cervical spine was performed following the  standard protocol without intravenous contrast. Multiplanar CT image reconstructions of the cervical spine were also generated. RADIATION DOSE REDUCTION: This exam was performed according to the departmental dose-optimization program which includes automated exposure control, adjustment of the mA and/or kV according to patient size and/or use of iterative reconstruction technique. COMPARISON:  Head CT 03/16/2022.  Cervical spine CT 02/24/2022. FINDINGS: CT HEAD FINDINGS Brain: No acute intracranial hemorrhage. Unchanged encephalomalacia in the right superior frontal gyrus, likely sequela of prior infarct or trauma. No hydrocephalus or extra-axial collection. Vascular: No hyperdense vessel or unexpected calcification. Skull: No calvarial fracture or suspicious bone lesion. Skull base is unremarkable. Sinuses/Orbits: Unremarkable. Other: No scalp hematoma. CT CERVICAL SPINE FINDINGS Alignment: Normal. Skull base and vertebrae: No acute fracture. Normal craniocervical junction. No suspicious bone lesions. Right-sided cervical rib like projection from the C7 vertebral body forming pseudarthrosis with the right first rib. Soft tissues and spinal canal: No prevertebral fluid or swelling. No visible canal hematoma. Atherosclerotic calcifications of the carotid bulbs. Disc levels: Central disc protrusions at C3-4 and C4-5. Small disc bulge at C5-6 results in no more than mild spinal canal stenosis. Upper chest: Unremarkable. Other: None. IMPRESSION: 1. No acute intracranial abnormality. Unchanged encephalomalacia in the right superior frontal gyrus, likely sequela of prior infarct or trauma. 2. No acute cervical spine fracture or traumatic listhesis. Electronically Signed   By: Emmit Alexanders M.D.   On: 11/11/2022 09:35   CT Head Wo Contrast  Result Date: 11/11/2022 CLINICAL DATA:  Head/neck trauma. Dizziness and weakness. Unremarkable. EXAM: CT HEAD WITHOUT CONTRAST CT CERVICAL SPINE WITHOUT CONTRAST TECHNIQUE:  Multidetector CT imaging of the head and cervical spine was performed following the standard protocol without intravenous contrast. Multiplanar CT image reconstructions of the cervical spine were also generated. RADIATION DOSE REDUCTION: This exam was performed according to the departmental dose-optimization program which includes automated exposure control, adjustment of the mA and/or kV according to patient  size and/or use of iterative reconstruction technique. COMPARISON:  Head CT 03/16/2022.  Cervical spine CT 02/24/2022. FINDINGS: CT HEAD FINDINGS Brain: No acute intracranial hemorrhage. Unchanged encephalomalacia in the right superior frontal gyrus, likely sequela of prior infarct or trauma. No hydrocephalus or extra-axial collection. Vascular: No hyperdense vessel or unexpected calcification. Skull: No calvarial fracture or suspicious bone lesion. Skull base is unremarkable. Sinuses/Orbits: Unremarkable. Other: No scalp hematoma. CT CERVICAL SPINE FINDINGS Alignment: Normal. Skull base and vertebrae: No acute fracture. Normal craniocervical junction. No suspicious bone lesions. Right-sided cervical rib like projection from the C7 vertebral body forming pseudarthrosis with the right first rib. Soft tissues and spinal canal: No prevertebral fluid or swelling. No visible canal hematoma. Atherosclerotic calcifications of the carotid bulbs. Disc levels: Central disc protrusions at C3-4 and C4-5. Small disc bulge at C5-6 results in no more than mild spinal canal stenosis. Upper chest: Unremarkable. Other: None. IMPRESSION: 1. No acute intracranial abnormality. Unchanged encephalomalacia in the right superior frontal gyrus, likely sequela of prior infarct or trauma. 2. No acute cervical spine fracture or traumatic listhesis. Electronically Signed   By: Emmit Alexanders M.D.   On: 11/11/2022 09:35    Microbiology: Results for orders placed or performed during the hospital encounter of 03/31/22  SARS Coronavirus 2  by RT PCR (hospital order, performed in [facility] hospital lab) *cepheid single result test* Anterior Nasal Swab     Status: None   Collection Time: 03/31/22 12:47 PM   Specimen: Anterior Nasal Swab  Result Value Ref Range Status   SARS Coronavirus 2 by RT PCR NEGATIVE NEGATIVE Final    Comment: (NOTE) SARS-CoV-2 target nucleic acids are NOT DETECTED.  The SARS-CoV-2 RNA is generally detectable in upper and lower respiratory specimens during the acute phase of infection. The lowest concentration of SARS-CoV-2 viral copies this assay can detect is 250 copies / mL. A negative result does not preclude SARS-CoV-2 infection and should not be used as the sole basis for treatment or other patient management decisions.  A negative result may occur with improper specimen collection / handling, submission of specimen other than nasopharyngeal swab, presence of viral mutation(s) within the areas targeted by this assay, and inadequate number of viral copies (<250 copies / mL). A negative result must be combined with clinical observations, patient history, and epidemiological information.  Fact Sheet for Patients:   https://www.patel.info/  Fact Sheet for Healthcare Providers: https://hall.com/  This test is not yet approved or  cleared by the [place] FDA and has been authorized for detection and/or diagnosis of SARS-CoV-2 by FDA under an Emergency Use Authorization (EUA).  This EUA will remain in effect (meaning this test can be used) for the duration of the COVID-19 declaration under Section 564(b)(1) of the Act, 21 U.S.C. section 360bbb-3(b)(1), unless the authorization is terminated or revoked sooner.  Performed at [facility], [address], [place], [place] [address]     Labs: CBC: Recent Labs  Lab 11/11/22 0835 11/12/22 0029  WBC 6.5 6.9  NEUTROABS 3.8  --   HGB 13.7 12.9  HCT 41.5 39.4  MCV 92.4 91.4  PLT 166 0000000   Basic  Metabolic Panel: Recent Labs  Lab 11/11/22 0835 11/12/22 0029 11/13/22 0032 11/14/22 0033  NA 134* 133* 135 134*  K 3.7 3.6 4.0 4.0  CL 94* 95* 100 99  CO2 26 26 27 24  $ GLUCOSE 181* 104* 139* 119*  BUN 28* 23 14 18  $ CREATININE 1.39* 1.39* 1.01* 1.09*  CALCIUM  8.7* 8.7* 9.0 8.6*  MG  --  1.7  --   --    Liver Function Tests: Recent Labs  Lab 11/11/22 0835  AST 25  ALT 22  ALKPHOS 52  BILITOT 0.7  PROT 7.0  ALBUMIN 4.0   CBG: Recent Labs  Lab 11/13/22 1118 11/13/22 1558 11/13/22 2108 11/14/22 0607 11/14/22 1038  GLUCAP 137* 132* 186* 131* 128*    Discharge time spent: greater than 30 minutes.  Signed: Estill Cotta, MD [facility] 11/14/2022

## 2022-11-14 NOTE — Consult Note (Signed)
Reason for Consult:Right shoulder pain Referring Physician: Ripudeep Rai Time calledST:[id number] Time at bedside: 1020   [name] is an 77 y.o. female.  HPI: Hurley woke up with right shoulder pain about 10d ago. This got steadily worse. She had two syncopal episodes and was admitted for that. Orthopedic surgery was consulted as MRI showed some inflammatory shoulder pathology. She is RHD and lives at home with her husband. She's had right shoulder problems in the past but generally as the result of falls and she was asymptomatic prior to last Friday.  Past Medical History:  Diagnosis Date   Anemia    Asthma    Atrial fibrillation ([place])    Breast carcinoma ([place]) 1995   [id number]   Cardioembolic stroke ([place]) [id number]   Right frontal in 01/2012; normal carotid ultrasound; possible LAA thrombus by TEE; virtual complete neurologic recovery   Chronic kidney disease, stage 2, mildly decreased GFR    GFR of approximately 60   Depression    Diabetes mellitus without complication ([place])    Diverticulosis of colon (without mention of hemorrhage) 2012   Dr. Laural Golden   Fasting hyperglycemia    120 fasting   Gastroesophageal reflux disease    Gastroparesis    Hemorrhoids    Hyperlipidemia    Hypertension    pt denies 05/30/13     Dr Kathe Mariner   Hyponatremia    Rheumatic heart disease    a. 07/2013 Echo: Ef 55-60%, Mod AS/AI, Mod-Sev MS, sev dil LA, PASP 58;  b. 07/2013 TEE: EF 35-40%, mild-mod AS/AI, mod-sev MS, LA smoke;  c. 07/2013 Cath: elev R heart pressures, nl cors.   Shortness of breath     Past Surgical History:  Procedure Laterality Date   AORTIC VALVE REPLACEMENT N/A 07/18/2013   Procedure: AORTIC VALVE REPLACEMENT (AVR);  Surgeon: Gaye Pollack, MD;  Location: [place];  Service: Open Heart Surgery;  Laterality: N/A;   BACK SURGERY     BREAST LUMPECTOMY Right 1995   CARDIAC CATHETERIZATION     CARDIOVERSION N/A 07/26/2013   Procedure: CARDIOVERSION;  Surgeon: Fay Records, MD;   Location: [place];  Service: Cardiovascular;  Laterality: N/A;   COLONOSCOPY  2012   Negative screening procedure   DILATION AND CURETTAGE OF UTERUS     ESOPHAGEAL MANOMETRY N/A 06/17/2013   Procedure: ESOPHAGEAL MANOMETRY (EM);  Surgeon: Sable Feil, MD;  Location: [facility];  Service: Endoscopy;  Laterality: N/A;   INTRAOPERATIVE TRANSESOPHAGEAL ECHOCARDIOGRAM N/A 07/18/2013   Procedure: INTRAOPERATIVE TRANSESOPHAGEAL ECHOCARDIOGRAM;  Surgeon: Gaye Pollack, MD;  Location: [place] [facility];  Service: Open Heart Surgery;  Laterality: N/A;   LEFT AND RIGHT HEART CATHETERIZATION WITH CORONARY ANGIOGRAM N/A 07/10/2013   Procedure: LEFT AND RIGHT HEART CATHETERIZATION WITH CORONARY ANGIOGRAM;  Surgeon: Burnell Blanks, MD;  Location[facility] CATH LAB;  Service: Cardiovascular;  Laterality: N/A;   LUMBAR LAMINECTOMY/DECOMPRESSION MICRODISCECTOMY Right 05/13/2014   Procedure: LUMBAR LAMINECTOMY/DECOMPRESSION MICRODISCECTOMY 1 LEVEL  lumbar four/five;  Surgeon: Faythe Ghee, MD;  Location: [facility];  Service: Neurosurgery;  Laterality: Right;   MITRAL VALVE REPLACEMENT N/A 07/18/2013   Procedure: MITRAL VALVE (MV) REPLACEMENT;  Surgeon: Gaye Pollack, MD;  Location: [place] [facility];  Service: Open Heart Surgery;  Laterality: N/A;   MITRAL VALVE [place], closed mitral valvulotomy by finger fracture   TEE WITHOUT CARDIOVERSION  01/24/2012   Procedure: TRANSESOPHAGEAL ECHOCARDIOGRAM (TEE);  Surgeon: Lelon Perla, MD;  Location: [facility]  ENDOSCOPY;  Service: Cardiovascular;  Laterality: N/A;   TEE WITHOUT CARDIOVERSION N/A 07/11/2013   Procedure: TRANSESOPHAGEAL ECHOCARDIOGRAM (TEE);  Surgeon: Thayer Headings, MD;  Location: [place];  Service: Cardiovascular;  Laterality: N/A;   TEE WITHOUT CARDIOVERSION N/A 07/26/2013   Procedure: TRANSESOPHAGEAL ECHOCARDIOGRAM (TEE);  Surgeon: Fay Records, MD;  Location: [facility] ENDOSCOPY;  Service: Cardiovascular;  Laterality: N/A;   TUBAL LIGATION  1973     Family History  Problem Relation Age of Onset   Heart disease Brother 63       MI   Rheum arthritis Maternal Grandmother    Asthma Maternal Grandfather    Hypothyroidism Mother    Diabetes Sister    Cirrhosis Father     Social History:  reports that she has never smoked. She has never used smokeless tobacco. She reports that she does not drink alcohol and does not use drugs.  Allergies:  Allergies  Allergen Reactions   Amiodarone Rash    Medications: I have reviewed the patient's current medications.  Results for orders placed or performed during the hospital encounter of 11/11/22 (from the past 48 hour(s))  Glucose, capillary     Status: Abnormal   Collection Time: 11/12/22 11:49 AM  Result Value Ref Range   Glucose-Capillary 138 (H) 70 - 99 mg/dL    Comment: Glucose reference range applies only to samples taken after fasting for at least 8 hours.   Comment 1 Notify RN    Comment 2 Document in Chart   Glucose, capillary     Status: Abnormal   Collection Time: 11/12/22  4:27 PM  Result Value Ref Range   Glucose-Capillary 141 (H) 70 - 99 mg/dL    Comment: Glucose reference range applies only to samples taken after fasting for at least 8 hours.  Glucose, capillary     Status: Abnormal   Collection Time: 11/12/22  9:05 PM  Result Value Ref Range   Glucose-Capillary 120 (H) 70 - 99 mg/dL    Comment: Glucose reference range applies only to samples taken after fasting for at least 8 hours.   Comment 1 Notify RN    Comment 2 Document in Chart   Protime-INR     Status: Abnormal   Collection Time: 11/13/22 12:32 AM  Result Value Ref Range   Prothrombin Time 28.0 (H) 11.4 - 15.2 seconds   INR 2.6 (H) 0.8 - 1.2    Comment: (NOTE) INR goal varies based on device and disease states. Performed at [facility], [place] [address] [place], [place] [id number]   Basic metabolic panel     Status: Abnormal   Collection Time: 11/13/22 12:32 AM  Result Value Ref Range   Sodium  135 135 - 145 mmol/L   Potassium 4.0 3.5 - 5.1 mmol/L   Chloride 100 98 - 111 mmol/L   CO2 27 22 - 32 mmol/L   Glucose, Bld 139 (H) 70 - 99 mg/dL    Comment: Glucose reference range applies only to samples taken after fasting for at least 8 hours.   BUN 14 8 - 23 mg/dL   Creatinine, Ser 1.01 (H) 0.44 - 1.00 mg/dL   Calcium 9.0 8.9 - 10.3 mg/dL   GFR, Estimated 57 (L) >60 mL/min    Comment: (NOTE) Calculated using the CKD-EPI Creatinine Equation (2021)    Anion gap 8 5 - 15    Comment: Performed at [place] [address], [place], [place] [address]  Glucose, capillary  Status: Abnormal   Collection Time: 11/13/22  6:12 AM  Result Value Ref Range   Glucose-Capillary 127 (H) 70 - 99 mg/dL    Comment: Glucose reference range applies only to samples taken after fasting for at least 8 hours.   Comment 1 Notify RN    Comment 2 Document in Chart   Glucose, capillary     Status: Abnormal   Collection Time: 11/13/22 11:18 AM  Result Value Ref Range   Glucose-Capillary 137 (H) 70 - 99 mg/dL    Comment: Glucose reference range applies only to samples taken after fasting for at least 8 hours.  Glucose, capillary     Status: Abnormal   Collection Time: 11/13/22  3:58 PM  Result Value Ref Range   Glucose-Capillary 132 (H) 70 - 99 mg/dL    Comment: Glucose reference range applies only to samples taken after fasting for at least 8 hours.  Glucose, capillary     Status: Abnormal   Collection Time: 11/13/22  9:08 PM  Result Value Ref Range   Glucose-Capillary 186 (H) 70 - 99 mg/dL    Comment: Glucose reference range applies only to samples taken after fasting for at least 8 hours.   Comment 1 Notify RN    Comment 2 Document in Chart   Protime-INR     Status: Abnormal   Collection Time: 11/14/22 12:33 AM  Result Value Ref Range   Prothrombin Time 30.1 (H) 11.4 - 15.2 seconds   INR 2.9 (H) 0.8 - 1.2    Comment: (NOTE) INR goal varies based on device and disease  states. Performed at [facility], [place] [address] [place], [place] [id number]   Basic metabolic panel     Status: Abnormal   Collection Time: 11/14/22 12:33 AM  Result Value Ref Range   Sodium 134 (L) 135 - 145 mmol/L   Potassium 4.0 3.5 - 5.1 mmol/L   Chloride 99 98 - 111 mmol/L   CO2 24 22 - 32 mmol/L   Glucose, Bld 119 (H) 70 - 99 mg/dL    Comment: Glucose reference range applies only to samples taken after fasting for at least 8 hours.   BUN 18 8 - 23 mg/dL   Creatinine, Ser 1.09 (H) 0.44 - 1.00 mg/dL   Calcium 8.6 (L) 8.9 - 10.3 mg/dL   GFR, Estimated 52 (L) >60 mL/min    Comment: (NOTE) Calculated using the CKD-EPI Creatinine Equation (2021)    Anion gap 11 5 - 15    Comment: Performed at [facility] [address], [place], [place] [address]  Glucose, capillary     Status: Abnormal   Collection Time: 11/14/22  6:07 AM  Result Value Ref Range   Glucose-Capillary 131 (H) 70 - 99 mg/dL    Comment: Glucose reference range applies only to samples taken after fasting for at least 8 hours.   Comment 1 Notify RN    Comment 2 Document in Chart     MR SHOULDER RIGHT WO CONTRAST  Result Date: 11/13/2022 CLINICAL DATA:  Chronic shoulder pain. EXAM: MRI OF THE RIGHT SHOULDER WITHOUT CONTRAST TECHNIQUE: Multiplanar, multisequence MR imaging of the shoulder was performed. No intravenous contrast was administered. COMPARISON:  None Available. FINDINGS: Rotator cuff: Moderate tendinosis of the supraspinatus tendon with a tiny partial-thickness articular surface tear. Mild tendinosis of the infraspinatus tendon. Teres minor tendon is intact. Subscapularis tendon is intact. Muscles: No muscle atrophy or edema. No intramuscular fluid collection or hematoma. Biceps  Long Head: Mild tendinosis of the intra-articular portion of the long head of the biceps tendon. Acromioclavicular Joint: Moderate arthropathy of the acromioclavicular joint. No subacromial/subdeltoid bursal fluid.  Glenohumeral Joint: No joint effusion. Partial-thickness cartilage loss of the glenohumeral joint. Labrum: Superior labral degeneration. Bones: No fracture or dislocation. No aggressive osseous lesion. Other: No fluid collection or hematoma. IMPRESSION: 1. Moderate tendinosis of the supraspinatus tendon with a tiny partial-thickness articular surface tear. 2. Mild tendinosis of the infraspinatus tendon. 3. Mild tendinosis of the intra-articular portion of the long head of the biceps tendon. 4. 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   On: 11/13/2022 14:38   ECHOCARDIOGRAM COMPLETE  Result Date: 11/12/2022    ECHOCARDIOGRAM REPORT   Patient Name:   [name] Date of Exam: 11/12/2022 Medical Rec #:  [id number]       Height:       62.0 in Accession #:    [id number]      Weight:       162.9 lb Date of Birth:  [birth year]       BSA:          1.752 m Patient Age:    68 years        BP:           144/89 mmHg Patient Gender: F               HR:           103 bpm. Exam Location:  Inpatient Procedure: 2D Echo, Color Doppler and Cardiac Doppler Indications:    syncope  History:        Patient has prior history of Echocardiogram examinations, most                 recent 04/01/2022. COPD, Arrythmias:Atrial Fibrillation,                 Signs/Symptoms:Syncope; Risk Factors:Hypertension, Dyslipidemia                 and Diabetes.                 Aortic Valve: 21 mm Magna Ease valve is present in the aortic                 position. Procedure Date: 2014.                 Mitral Valve: 25 mm Magna Ease bioprosthetic valve valve is                 present in the mitral position. Procedure Date: 2014.  Sonographer:    Johny Chess RDCS Referring Phys: [id number] [place]  1. Left ventricular ejection fraction, by estimation, is 55 to 60%. The left ventricle has normal function. The left ventricle has no regional wall motion abnormalities. There  is mild left ventricular hypertrophy. Indeterminate diastolic filling due to E-A fusion.  2. Right ventricular systolic function was not well visualized. The right ventricular size is normal. There is moderately elevated pulmonary artery systolic pressure. The estimated right ventricular systolic pressure is [id number] mmHg.  3. Left atrial size was mildly dilated.  4. The mitral valve has been repaired/replaced. No evidence of mitral valve regurgitation. No evidence of mitral stenosis. The mean mitral valve gradient is 7.0 mmHg at HR of 105 bpm. There is a 25 mm Magna Ease bioprosthetic valve present in the mitral  position.  Procedure Date: 2014. Grossly normal function.  5. The aortic valve has been repaired/replaced. Aortic valve regurgitation is not visualized. No aortic stenosis is present. There is a 21 mm Magna Ease valve present in the aortic position. Procedure Date: 2014. Aortic valve mean gradient measures 10.0  mmHg. Aortic valve Vmax measures 2.13 m/s. Grossly normal function.  6. The inferior vena cava is normal in size with greater than 50% respiratory variability, suggesting right atrial pressure of 3 mmHg. Comparison(s): No significant change from prior study. FINDINGS  Left Ventricle: Left ventricular ejection fraction, by estimation, is 55 to 60%. The left ventricle has normal function. The left ventricle has no regional wall motion abnormalities. The left ventricular internal cavity size was normal in size. There is  mild left ventricular hypertrophy. Indeterminate diastolic filling due to E-A fusion. Right Ventricle: The right ventricular size is normal. No increase in right ventricular wall thickness. Right ventricular systolic function was not well visualized. There is moderately elevated pulmonary artery systolic pressure. The tricuspid regurgitant velocity is 3.58 m/s, and with an assumed right atrial pressure of 3 mmHg, the estimated right ventricular systolic pressure is [id number] mmHg. Left Atrium:  Left atrial size was mildly dilated. Right Atrium: Right atrial size was normal in size. Pericardium: There is no evidence of pericardial effusion. Mitral Valve: The mitral valve has been repaired/replaced. No evidence of mitral valve regurgitation. There is a 25 mm Magna Ease bioprosthetic valve present in the mitral position. Procedure Date: 2014. No evidence of mitral valve stenosis. MV peak gradient, 13.2 mmHg. The mean mitral valve gradient is 7.0 mmHg. Tricuspid Valve: The tricuspid valve is normal in structure. Tricuspid valve regurgitation is mild . No evidence of tricuspid stenosis. Aortic Valve: The aortic valve has been repaired/replaced. Aortic valve regurgitation is not visualized. No aortic stenosis is present. Aortic valve mean gradient measures 10.0 mmHg. Aortic valve peak gradient measures 18.1 mmHg. Aortic valve area, by VTI measures 0.99 cm. There is a 21 mm Magna Ease valve present in the aortic position. Procedure Date: 2014. Pulmonic Valve: The pulmonic valve was normal in structure. Pulmonic valve regurgitation is trivial. No evidence of pulmonic stenosis. Aorta: The aortic root is normal in size and structure. Venous: The inferior vena cava is normal in size with greater than 50% respiratory variability, suggesting right atrial pressure of 3 mmHg. IAS/Shunts: No atrial level shunt detected by color flow Doppler.  LEFT VENTRICLE PLAX 2D LVIDd:         2.90 cm LVIDs:         2.10 cm LV PW:         1.20 cm LV IVS:        0.90 cm LVOT diam:     1.60 cm LV SV:         36 LV SV Index:   20 LVOT Area:     2.01 cm  RIGHT VENTRICLE            IVC RV Basal diam:  2.60 cm    IVC diam: 1.50 cm RV S prime:     6.09 cm/s TAPSE (M-mode): 0.7 cm LEFT ATRIUM             Index        RIGHT ATRIUM           Index LA diam:        3.30 cm 1.88 cm/m   RA Area:     12.90 cm LA Vol (A2C):   59.7  ml 34.07 ml/m  RA Volume:   28.40 ml  16.21 ml/m LA Vol (A4C):   59.7 ml 34.07 ml/m LA Biplane Vol: 61.6 ml 35.16  ml/m  AORTIC VALVE AV Area (Vmax):    0.95 cm AV Area (Vmean):   0.89 cm AV Area (VTI):     0.99 cm AV Vmax:           213.00 cm/s AV Vmean:          149.000 cm/s AV VTI:            0.361 m AV Peak Grad:      18.1 mmHg AV Mean Grad:      10.0 mmHg LVOT Vmax:         101.00 cm/s LVOT Vmean:        66.100 cm/s LVOT VTI:          0.178 m LVOT/AV VTI ratio: 0.49  AORTA Ao Asc diam: 2.70 cm MITRAL VALVE                TRICUSPID VALVE MV Area (PHT): 3.54 cm     TR Peak grad:   51.3 mmHg MV Area VTI:   0.99 cm     TR Vmax:        358.00 cm/s MV Peak grad:  13.2 mmHg MV Mean grad:  7.0 mmHg     SHUNTS MV Vmax:       1.82 m/s     Systemic VTI:  0.18 m MV Vmean:      125.0 cm/s   Systemic Diam: 1.60 cm MV Decel Time: 214 msec MV E velocity: 175.00 cm/s MV A velocity: 82.40 cm/s MV E/A ratio:  2.12 Vishnu Priya Mallipeddi Electronically signed by Lorelee Cover Mallipeddi Signature Date/Time: 11/12/2022/3:49:02 PM    Final     Review of Systems  HENT:  Negative for ear discharge, ear pain, hearing loss and tinnitus.   Eyes:  Negative for photophobia and pain.  Respiratory:  Negative for cough and shortness of breath.   Cardiovascular:  Negative for chest pain.  Gastrointestinal:  Negative for abdominal pain, nausea and vomiting.  Genitourinary:  Negative for dysuria, flank pain, frequency and urgency.  Musculoskeletal:  Positive for arthralgias (Right shoulder). Negative for back pain, myalgias and neck pain.  Neurological:  Negative for dizziness and headaches.  Hematological:  Does not bruise/bleed easily.  Psychiatric/Behavioral:  The patient is not nervous/anxious.    Blood pressure (!) 148/85, pulse (!) 105, temperature 98.8 F (37.1 C), temperature source Oral, resp. rate 16, height 5' 2"$  (1.575 m), weight 75.7 kg, SpO2 93 %. Physical Exam Constitutional:      General: She is not in acute distress.    Appearance: She is well-developed. She is not diaphoretic.  HENT:     Head: Normocephalic and  atraumatic.  Eyes:     General: No scleral icterus.       Right eye: No discharge.        Left eye: No discharge.     Conjunctiva/sclera: Conjunctivae normal.  Cardiovascular:     Rate and Rhythm: Normal rate and regular rhythm.  Pulmonary:     Effort: Pulmonary effort is normal. No respiratory distress.  Musculoskeletal:     Cervical back: Normal range of motion.     Comments: Right shoulder, elbow, wrist, digits- no skin wounds, mod TTP anterior, mild TTP lat/post, minimal TTP AC, unrestricted AROM ext and abduction without increase in pain, 3/5 against resistance with  mild increase in pain, no instability, no blocks to motion  Sens  Ax/R/M/U intact  Mot   Ax/ R/ PIN/ M/ AIN/ U intact  Rad 2+  Skin:    General: Skin is warm and dry.  Neurological:     Mental Status: She is alert.  Psychiatric:        Mood and Affect: Mood normal.        Behavior: Behavior normal.     Assessment/Plan: Right shoulder pain -- Given excellent ROM and anticoagulated status would try to treat inflammation medically first rather than risk giving her a hemarthrosis. Suggest prednisone 68m daily for 1 week followed by a taper. She may use sling for comfort. F/u with Dr. VGriffin Basilin 2 weeks.    MLisette Abu PA-C Orthopedic Surgery [phone]/09/2023, 10:33 AM

## 2022-11-14 NOTE — Progress Notes (Signed)
RN went over discharge instructions with patient and patient's husband at the bedside. RN went over medication changes and follow up visits. Medications are sent to [facility]. Patient and patient's husband verbalize understanding. NT removed PIV and tele monitor removed. CCMD notified of discharge. Patient is getting dressed and transportation is at the bedside.

## 2022-11-14 NOTE — Progress Notes (Signed)
Cardiology Progress Note  Patient ID: JAHNIYAH CASERES MRN: [id number] DOB: Jun 13, 1946 Date of Encounter: 11/14/2022  Primary Cardiologist: Carlyle Dolly, MD  Subjective   Chief Complaint: None.   HPI: Heart rate in the 100s.  Orthostatic when working with physical therapy yesterday.  Syncopal episode was triggered by pain.  ROS:  All other ROS reviewed and negative. Pertinent positives noted in the HPI.     Inpatient Medications  Scheduled Meds:  atorvastatin  40 mg Oral Daily   budesonide  0.25 mg Nebulization BID   escitalopram  10 mg Oral Daily   fluticasone  1 spray Each Nare Daily   gabapentin  100 mg Oral QID   insulin aspart  0-5 Units Subcutaneous QHS   insulin aspart  0-9 Units Subcutaneous TID WC   levothyroxine  100 mcg Oral QAC breakfast   loratadine  10 mg Oral Daily   metoprolol tartrate  25 mg Oral BID   pantoprazole  40 mg Oral QAC breakfast   tiZANidine  2 mg Oral BID   warfarin  2.5 mg Oral ONCE-1600   Warfarin - Pharmacist Dosing Inpatient   Does not apply q1600   Continuous Infusions:  PRN Meds: acetaminophen **OR** acetaminophen, albuterol, dextromethorphan-guaiFENesin, HYDROcodone-acetaminophen, melatonin, morphine injection, ondansetron **OR** ondansetron (ZOFRAN) IV   Vital Signs   Vitals:   11/14/22 0609 11/14/22 0734 11/14/22 0838 11/14/22 0955  BP: (!) 127/93 (!) 106/59  (!) 148/85  Pulse: (!) 107 (!) 109  (!) 105  Resp: 17 16    Temp: 97.6 F (36.4 C) 98.8 F (37.1 C)    TempSrc: Oral Oral    SpO2: 94% 99% 93%   Weight: 75.7 kg     Height:        Intake/Output Summary (Last 24 hours) at 11/14/2022 1013 Last data filed at 11/14/2022 0806 Gross per 24 hour  Intake 760 ml  Output --  Net 760 ml      11/14/2022    6:09 AM 11/13/2022    6:12 AM 11/12/2022    5:09 AM  Last 3 Weights  Weight (lbs) 166 lb 12.8 oz 164 lb 3.2 oz 162 lb 14.4 oz  Weight (kg) 75.66 kg 74.481 kg 73.891 kg      Telemetry  Overnight telemetry shows  atrial flutter heart rate 104, which I personally reviewed.   ECG  The most recent ECG shows a flutter heart rate 51, 41 block, which I personally reviewed.   Physical Exam   Vitals:   11/14/22 0609 11/14/22 0734 11/14/22 0838 11/14/22 0955  BP: (!) 127/93 (!) 106/59  (!) 148/85  Pulse: (!) 107 (!) 109  (!) 105  Resp: 17 16    Temp: 97.6 F (36.4 C) 98.8 F (37.1 C)    TempSrc: Oral Oral    SpO2: 94% 99% 93%   Weight: 75.7 kg     Height:        Intake/Output Summary (Last 24 hours) at 11/14/2022 1013 Last data filed at 11/14/2022 0806 Gross per 24 hour  Intake 760 ml  Output --  Net 760 ml       11/14/2022    6:09 AM 11/13/2022    6:12 AM 11/12/2022    5:09 AM  Last 3 Weights  Weight (lbs) 166 lb 12.8 oz 164 lb 3.2 oz 162 lb 14.4 oz  Weight (kg) 75.66 kg 74.481 kg 73.891 kg    Body mass index is 30.51 kg/m.  General: Well nourished, well developed, in  no acute distress Head: Atraumatic, normal size  Eyes: PEERLA, EOMI  Neck: Supple, no JVD Endocrine: No thryomegaly Cardiac: Normal, normal S1 and S2, no murmurs Lungs: Clear to auscultation bilaterally, no wheezing, rhonchi or rales  Abd: Soft, nontender, no hepatomegaly  Ext: No edema, pulses 2+ Musculoskeletal: No deformities, BUE and BLE strength normal and equal Skin: Warm and dry, no rashes   Neuro: Alert and oriented to person, place, time, and situation, CNII-XII grossly intact, no focal deficits  Psych: Normal mood and affect   Labs  High Sensitivity Troponin:  No results for input(s): "TROPONINIHS" in the last 720 hours.   Cardiac EnzymesNo results for input(s): "TROPONINI" in the last 168 hours. No results for input(s): "TROPIPOC" in the last 168 hours.  Chemistry Recent Labs  Lab 11/11/22 0835 11/12/22 0029 11/13/22 0032 11/14/22 0033  NA 134* 133* 135 134*  K 3.7 3.6 4.0 4.0  CL 94* 95* 100 99  CO2 26 26 27 24  $ GLUCOSE 181* 104* 139* 119*  BUN 28* 23 14 18  $ CREATININE 1.39* 1.39* 1.01* 1.09*   CALCIUM 8.7* 8.7* 9.0 8.6*  PROT 7.0  --   --   --   ALBUMIN 4.0  --   --   --   AST 25  --   --   --   ALT 22  --   --   --   ALKPHOS 52  --   --   --   BILITOT 0.7  --   --   --   GFRNONAA 39* 39* 57* 52*  ANIONGAP 14 12 8 11    $ Hematology Recent Labs  Lab 11/11/22 0835 11/12/22 0029  WBC 6.5 6.9  RBC 4.49 4.31  HGB 13.7 12.9  HCT 41.5 39.4  MCV 92.4 91.4  MCH 30.5 29.9  MCHC 33.0 32.7  RDW 14.5 14.1  PLT 166 158   BNP Recent Labs  Lab 11/11/22 0835  BNP 137.0*    DDimer No results for input(s): "DDIMER" in the last 168 hours.   Radiology  MR SHOULDER RIGHT WO CONTRAST  Result Date: 11/13/2022 CLINICAL DATA:  Chronic shoulder pain. EXAM: MRI OF THE RIGHT SHOULDER WITHOUT CONTRAST TECHNIQUE: Multiplanar, multisequence MR imaging of the shoulder was performed. No intravenous contrast was administered. COMPARISON:  None Available. FINDINGS: Rotator cuff: Moderate tendinosis of the supraspinatus tendon with a tiny partial-thickness articular surface tear. Mild tendinosis of the infraspinatus tendon. Teres minor tendon is intact. Subscapularis tendon is intact. Muscles: No muscle atrophy or edema. No intramuscular fluid collection or hematoma. Biceps Long Head: Mild tendinosis of the intra-articular portion of the long head of the biceps tendon. Acromioclavicular Joint: Moderate arthropathy of the acromioclavicular joint. No subacromial/subdeltoid bursal fluid. Glenohumeral Joint: No joint effusion. Partial-thickness cartilage loss of the glenohumeral joint. Labrum: Superior labral degeneration. Bones: No fracture or dislocation. No aggressive osseous lesion. Other: No fluid collection or hematoma. IMPRESSION: 1. Moderate tendinosis of the supraspinatus tendon with a tiny partial-thickness articular surface tear. 2. Mild tendinosis of the infraspinatus tendon. 3. Mild tendinosis of the intra-articular portion of the long head of the biceps tendon. 4. Mild-moderate osteoarthritis of  the right glenohumeral joint Electronically Signed   By: Kathreen Devoid M.D.   On: 11/13/2022 14:38   ECHOCARDIOGRAM COMPLETE  Result Date: 11/12/2022    ECHOCARDIOGRAM REPORT   Patient Name:   EVARISTA REICHENBERGER Date of Exam: 11/12/2022 Medical Rec #:  [id number]       Height:  62.0 in Accession #:    [id number]      Weight:       162.9 lb Date of Birth:  October 03, 1946       BSA:          1.752 m Patient Age:    74 years        BP:           144/89 mmHg Patient Gender: F               HR:           103 bpm. Exam Location:  Inpatient Procedure: 2D Echo, Color Doppler and Cardiac Doppler Indications:    syncope  History:        Patient has prior history of Echocardiogram examinations, most                 recent 04/01/2022. COPD, Arrythmias:Atrial Fibrillation,                 Signs/Symptoms:Syncope; Risk Factors:Hypertension, Dyslipidemia                 and Diabetes.                 Aortic Valve: 21 mm Magna Ease valve is present in the aortic                 position. Procedure Date: 2014.                 Mitral Valve: 25 mm Magna Ease bioprosthetic valve valve is                 present in the mitral position. Procedure Date: 2014.  Sonographer:    Johny Chess RDCS Referring Phys: [id number] [place]  1. Left ventricular ejection fraction, by estimation, is 55 to 60%. The left ventricle has normal function. The left ventricle has no regional wall motion abnormalities. There is mild left ventricular hypertrophy. Indeterminate diastolic filling due to E-A fusion.  2. Right ventricular systolic function was not well visualized. The right ventricular size is normal. There is moderately elevated pulmonary artery systolic pressure. The estimated right ventricular systolic pressure is [id number] mmHg.  3. Left atrial size was mildly dilated.  4. The mitral valve has been repaired/replaced. No evidence of mitral valve regurgitation. No evidence of mitral stenosis. The mean mitral valve gradient is 7.0 mmHg at  HR of 105 bpm. There is a 25 mm Magna Ease bioprosthetic valve present in the mitral  position. Procedure Date: 2014. Grossly normal function.  5. The aortic valve has been repaired/replaced. Aortic valve regurgitation is not visualized. No aortic stenosis is present. There is a 21 mm Magna Ease valve present in the aortic position. Procedure Date: 2014. Aortic valve mean gradient measures 10.0  mmHg. Aortic valve Vmax measures 2.13 m/s. Grossly normal function.  6. The inferior vena cava is normal in size with greater than 50% respiratory variability, suggesting right atrial pressure of 3 mmHg. Comparison(s): No significant change from prior study. FINDINGS  Left Ventricle: Left ventricular ejection fraction, by estimation, is 55 to 60%. The left ventricle has normal function. The left ventricle has no regional wall motion abnormalities. The left ventricular internal cavity size was normal in size. There is  mild left ventricular hypertrophy. Indeterminate diastolic filling due to E-A fusion. Right Ventricle: The right ventricular size is normal. No increase in right ventricular wall thickness. Right ventricular systolic function was not well  visualized. There is moderately elevated pulmonary artery systolic pressure. The tricuspid regurgitant velocity is 3.58 m/s, and with an assumed right atrial pressure of 3 mmHg, the estimated right ventricular systolic pressure is [id number] mmHg. Left Atrium: Left atrial size was mildly dilated. Right Atrium: Right atrial size was normal in size. Pericardium: There is no evidence of pericardial effusion. Mitral Valve: The mitral valve has been repaired/replaced. No evidence of mitral valve regurgitation. There is a 25 mm Magna Ease bioprosthetic valve present in the mitral position. Procedure Date: 2014. No evidence of mitral valve stenosis. MV peak gradient, 13.2 mmHg. The mean mitral valve gradient is 7.0 mmHg. Tricuspid Valve: The tricuspid valve is normal in structure.  Tricuspid valve regurgitation is mild . No evidence of tricuspid stenosis. Aortic Valve: The aortic valve has been repaired/replaced. Aortic valve regurgitation is not visualized. No aortic stenosis is present. Aortic valve mean gradient measures 10.0 mmHg. Aortic valve peak gradient measures 18.1 mmHg. Aortic valve area, by VTI measures 0.99 cm. There is a 21 mm Magna Ease valve present in the aortic position. Procedure Date: 2014. Pulmonic Valve: The pulmonic valve was normal in structure. Pulmonic valve regurgitation is trivial. No evidence of pulmonic stenosis. Aorta: The aortic root is normal in size and structure. Venous: The inferior vena cava is normal in size with greater than 50% respiratory variability, suggesting right atrial pressure of 3 mmHg. IAS/Shunts: No atrial level shunt detected by color flow Doppler.  LEFT VENTRICLE PLAX 2D LVIDd:         2.90 cm LVIDs:         2.10 cm LV PW:         1.20 cm LV IVS:        0.90 cm LVOT diam:     1.60 cm LV SV:         36 LV SV Index:   20 LVOT Area:     2.01 cm  RIGHT VENTRICLE            IVC RV Basal diam:  2.60 cm    IVC diam: 1.50 cm RV S prime:     6.09 cm/s TAPSE (M-mode): 0.7 cm LEFT ATRIUM             Index        RIGHT ATRIUM           Index LA diam:        3.30 cm 1.88 cm/m   RA Area:     12.90 cm LA Vol (A2C):   59.7 ml 34.07 ml/m  RA Volume:   28.40 ml  16.21 ml/m LA Vol (A4C):   59.7 ml 34.07 ml/m LA Biplane Vol: 61.6 ml 35.16 ml/m  AORTIC VALVE AV Area (Vmax):    0.95 cm AV Area (Vmean):   0.89 cm AV Area (VTI):     0.99 cm AV Vmax:           213.00 cm/s AV Vmean:          149.000 cm/s AV VTI:            0.361 m AV Peak Grad:      18.1 mmHg AV Mean Grad:      10.0 mmHg LVOT Vmax:         101.00 cm/s LVOT Vmean:        66.100 cm/s LVOT VTI:          0.178 m LVOT/AV VTI ratio: 0.49  AORTA Ao Asc diam:  2.70 cm MITRAL VALVE                TRICUSPID VALVE MV Area (PHT): 3.54 cm     TR Peak grad:   51.3 mmHg MV Area VTI:   0.99 cm     TR  Vmax:        358.00 cm/s MV Peak grad:  13.2 mmHg MV Mean grad:  7.0 mmHg     SHUNTS MV Vmax:       1.82 m/s     Systemic VTI:  0.18 m MV Vmean:      125.0 cm/s   Systemic Diam: 1.60 cm MV Decel Time: 214 msec MV E velocity: 175.00 cm/s MV A velocity: 82.40 cm/s MV E/A ratio:  2.12 Vishnu Priya Mallipeddi Electronically signed by Lorelee Cover Mallipeddi Signature Date/Time: 11/12/2022/3:49:02 PM    Final     Cardiac Studies  TTE 11/12/2022  1. Left ventricular ejection fraction, by estimation, is 55 to 60%. The  left ventricle has normal function. The left ventricle has no regional  wall motion abnormalities. There is mild left ventricular hypertrophy.  Indeterminate diastolic filling due to  E-A fusion.   2. Right ventricular systolic function was not well visualized. The right  ventricular size is normal. There is moderately elevated pulmonary artery  systolic pressure. The estimated right ventricular systolic pressure is  [id number] mmHg.   3. Left atrial size was mildly dilated.   4. The mitral valve has been repaired/replaced. No evidence of mitral  valve regurgitation. No evidence of mitral stenosis. The mean mitral valve  gradient is 7.0 mmHg at HR of 105 bpm. There is a 25 mm Magna Ease  bioprosthetic valve present in the mitral   position. Procedure Date: 2014. Grossly normal function.   5. The aortic valve has been repaired/replaced. Aortic valve  regurgitation is not visualized. No aortic stenosis is present. There is a  21 mm Magna Ease valve present in the aortic position. Procedure Date:  2014. Aortic valve mean gradient measures 10.0   mmHg. Aortic valve Vmax measures 2.13 m/s. Grossly normal function.   6. The inferior vena cava is normal in size with greater than 50%  respiratory variability, suggesting right atrial pressure of 3 mmHg.   Patient Profile  MARILDA MCNABB is a 77 y.o. female with aortic valve/mitral valve replacement, atrial flutter on anticoagulation,  hypertension, HFpEF who was admitted on 11/11/2022 after a syncopal episode.  Referred to [facility] due to bradycardia.  Assessment & Plan   # Vasovagal syncope, situational -Describes intense shoulder pain that then triggered a syncopal episode.  She was also orthostatic with low blood pressure and bradycardic. -To me this episode is vasovagal in nature.  It was likely triggered by pain.  Also with orthostasis. -We will back off on her AV nodal agents.  Her echo is reassuring.  She can be discharged home with a 14-day ZIO.  We will arrange this.  She can follow-up with Dr. Harl Bowie. -No indications for pacing at this time.  # Orthostatic hypotension -Noted to have orthostasis when working with physical therapy yesterday.  Would recommend we reduce her torsemide to 20 mg daily.  She can be put on this at discharge. -Would hold her ARB.  # Persistent atrial flutter -Chronic.  Did have bradycardia in the setting of likely vasovagal episode.  Would go back on metoprolol tartrate 25 mg twice daily.  Would recommend against diltiazem at this time. -Continue warfarin.  INR goal 2-3.  #HTN -Now with orthostatic hypotension.  Would hold ARB at discharge.  Hold diltiazem as well.  #LE Edema #HFpEF -Suspect dehydration is contributing.  Would recommend she take torsemide 20 mg daily.  She may need to even back off on this.  This can be deferred to outpatient cardiologist.  [facility] will sign off.   Medication Recommendations: Add back metoprolol titrate 25 mg twice daily.  Hold home diltiazem as well as ARB.  Torsemide 20 mg daily at discharge. Other recommendations (labs, testing, etc): 14-day ZIO Follow up as an outpatient: 1 to 2 weeks in the [place] office  For questions or updates, please contact [place] Please consult [email or web address] for contact info under        Signed, [place] T. Audie Box, MD, [place]  11/14/2022 10:13 AM

## 2022-11-15 ENCOUNTER — Encounter: Payer: Self-pay | Admitting: Cardiology

## 2022-11-15 DIAGNOSIS — R55 Syncope and collapse: Secondary | ICD-10-CM | POA: Diagnosis not present

## 2022-11-17 DIAGNOSIS — M7581 Other shoulder lesions, right shoulder: Secondary | ICD-10-CM | POA: Diagnosis not present

## 2022-11-17 DIAGNOSIS — M75111 Incomplete rotator cuff tear or rupture of right shoulder, not specified as traumatic: Secondary | ICD-10-CM | POA: Diagnosis not present

## 2022-11-17 DIAGNOSIS — M19011 Primary osteoarthritis, right shoulder: Secondary | ICD-10-CM | POA: Diagnosis not present

## 2022-11-17 DIAGNOSIS — I110 Hypertensive heart disease with heart failure: Secondary | ICD-10-CM | POA: Diagnosis not present

## 2022-11-17 DIAGNOSIS — I4892 Unspecified atrial flutter: Secondary | ICD-10-CM | POA: Diagnosis not present

## 2022-11-17 DIAGNOSIS — I6521 Occlusion and stenosis of right carotid artery: Secondary | ICD-10-CM | POA: Diagnosis not present

## 2022-11-17 DIAGNOSIS — I5032 Chronic diastolic (congestive) heart failure: Secondary | ICD-10-CM | POA: Diagnosis not present

## 2022-11-17 DIAGNOSIS — M7521 Bicipital tendinitis, right shoulder: Secondary | ICD-10-CM | POA: Diagnosis not present

## 2022-11-17 DIAGNOSIS — G8929 Other chronic pain: Secondary | ICD-10-CM | POA: Diagnosis not present

## 2022-11-21 DIAGNOSIS — M19011 Primary osteoarthritis, right shoulder: Secondary | ICD-10-CM | POA: Diagnosis not present

## 2022-11-21 DIAGNOSIS — I110 Hypertensive heart disease with heart failure: Secondary | ICD-10-CM | POA: Diagnosis not present

## 2022-11-21 DIAGNOSIS — I483 Typical atrial flutter: Secondary | ICD-10-CM | POA: Diagnosis not present

## 2022-11-21 DIAGNOSIS — G8929 Other chronic pain: Secondary | ICD-10-CM | POA: Diagnosis not present

## 2022-11-21 DIAGNOSIS — M7521 Bicipital tendinitis, right shoulder: Secondary | ICD-10-CM | POA: Diagnosis not present

## 2022-11-21 DIAGNOSIS — I5023 Acute on chronic systolic (congestive) heart failure: Secondary | ICD-10-CM | POA: Diagnosis not present

## 2022-11-21 DIAGNOSIS — M7581 Other shoulder lesions, right shoulder: Secondary | ICD-10-CM | POA: Diagnosis not present

## 2022-11-21 DIAGNOSIS — I4892 Unspecified atrial flutter: Secondary | ICD-10-CM | POA: Diagnosis not present

## 2022-11-21 DIAGNOSIS — I5032 Chronic diastolic (congestive) heart failure: Secondary | ICD-10-CM | POA: Diagnosis not present

## 2022-11-21 DIAGNOSIS — I6521 Occlusion and stenosis of right carotid artery: Secondary | ICD-10-CM | POA: Diagnosis not present

## 2022-11-21 DIAGNOSIS — M75111 Incomplete rotator cuff tear or rupture of right shoulder, not specified as traumatic: Secondary | ICD-10-CM | POA: Diagnosis not present

## 2022-11-21 DIAGNOSIS — R55 Syncope and collapse: Secondary | ICD-10-CM | POA: Diagnosis not present

## 2022-11-22 DIAGNOSIS — M19011 Primary osteoarthritis, right shoulder: Secondary | ICD-10-CM | POA: Diagnosis not present

## 2022-11-22 DIAGNOSIS — M7581 Other shoulder lesions, right shoulder: Secondary | ICD-10-CM | POA: Diagnosis not present

## 2022-11-23 ENCOUNTER — Encounter: Payer: Self-pay | Admitting: Nurse Practitioner

## 2022-11-23 ENCOUNTER — Ambulatory Visit: Payer: Medicare PPO | Attending: Cardiology | Admitting: Nurse Practitioner

## 2022-11-23 ENCOUNTER — Ambulatory Visit (INDEPENDENT_AMBULATORY_CARE_PROVIDER_SITE_OTHER): Payer: Medicare PPO | Admitting: *Deleted

## 2022-11-23 VITALS — BP 117/77 | HR 108 | Ht 62.5 in | Wt 164.0 lb

## 2022-11-23 DIAGNOSIS — R55 Syncope and collapse: Secondary | ICD-10-CM

## 2022-11-23 DIAGNOSIS — R000 Tachycardia, unspecified: Secondary | ICD-10-CM

## 2022-11-23 DIAGNOSIS — I6521 Occlusion and stenosis of right carotid artery: Secondary | ICD-10-CM | POA: Diagnosis not present

## 2022-11-23 DIAGNOSIS — Z952 Presence of prosthetic heart valve: Secondary | ICD-10-CM

## 2022-11-23 DIAGNOSIS — I5032 Chronic diastolic (congestive) heart failure: Secondary | ICD-10-CM

## 2022-11-23 DIAGNOSIS — E785 Hyperlipidemia, unspecified: Secondary | ICD-10-CM | POA: Diagnosis not present

## 2022-11-23 DIAGNOSIS — Z5181 Encounter for therapeutic drug level monitoring: Secondary | ICD-10-CM | POA: Diagnosis not present

## 2022-11-23 DIAGNOSIS — I10 Essential (primary) hypertension: Secondary | ICD-10-CM | POA: Diagnosis not present

## 2022-11-23 DIAGNOSIS — I38 Endocarditis, valve unspecified: Secondary | ICD-10-CM | POA: Diagnosis not present

## 2022-11-23 DIAGNOSIS — I4891 Unspecified atrial fibrillation: Secondary | ICD-10-CM

## 2022-11-23 DIAGNOSIS — Z8673 Personal history of transient ischemic attack (TIA), and cerebral infarction without residual deficits: Secondary | ICD-10-CM | POA: Diagnosis not present

## 2022-11-23 DIAGNOSIS — Z87898 Personal history of other specified conditions: Secondary | ICD-10-CM | POA: Diagnosis not present

## 2022-11-23 DIAGNOSIS — I4892 Unspecified atrial flutter: Secondary | ICD-10-CM

## 2022-11-23 LAB — POCT INR: INR: 3.3 — AB (ref 2.0–3.0)

## 2022-11-23 NOTE — Patient Instructions (Addendum)
Medication Instructions:  May take an extra tablet of Metoprolol as needed when heart rate is greater than 100 BPM and top number of blood pressure is greater than 120  *If you need a refill on your cardiac medications before your next appointment, please call your pharmacy*  Lab Work: NONE ordered at this time of appointment   If you have labs (blood work) drawn today and your tests are completely normal, you will receive your results only by: [place] (if you have MyChart) OR A paper copy in the mail If you have any lab test that is abnormal or we need to change your treatment, we will call you to review the results.  Testing/Procedures: NONE ordered at this time of appointment   Follow-Up: At [facility], you and your health needs are our priority.  As part of our continuing mission to provide you with exceptional heart care, we have created designated Provider Care Teams.  These Care Teams include your primary Cardiologist (physician) and Advanced Practice Providers (APPs -  Physician Assistants and Nurse Practitioners) who all work together to provide you with the care you need, when you need it.   Your next appointment:   6-8 week(s)  Provider:   Finis Bud, NP   Other Instructions Monitor blood pressure and heart rate (pulse) at home.  You have been referred to Electrophysiology

## 2022-11-23 NOTE — Progress Notes (Signed)
Cardiology Office Note:    Date:  11/23/2022  ID:  [name], DOB April 30, 1946, MRN [id number]  PCP:  [name], [place] Providers Cardiologist:  Carlyle Dolly, MD     Referring MD: [name], MD   CC: Hospital follow-up  History of Present Illness:    [name] is a 77 y.o. female with a hx of the following:  A-fib Valvular heart disease, s/p MVR and AVR Chronic diastolic CHF Shortness of breath Hypertension Hyperlipidemia Carotid artery stenosis History of CVA  Patient is a 77 year old female with past medical history as mentioned above.  In 2014, patient underwent mitral valve replacement with 25 mm Edwards Magna-ease pericardial valve and also aortic valve replacement with 21 mm [place] Magna-ease pericardial valve.  Was admitted in June 2023 with hypoxia and shortness of breath.  Echocardiogram at that time revealed EF 60%, normal MVR and AVR, indeterminate diastolic function.  Chest x-ray revealed right base atelectasis/developing infection, loculated pleural fluid and right lateral lung slightly increased from prior, with possible component of CHF.  Last seen by Dr. Carlyle Dolly on October 10, 2022.  Home weights are stable.  Was taking Lasix 80 mg twice daily.  Was compliant with her medications.  Noted some ongoing issues with intermittent fluid volume overload, hypoxia at home.  Her symptoms are improving since diuretic change.  Torsemide was changed to 40 mg in the morning and 20 mg in the evening, and may take an extra 20 mg daily as needed for swelling or shortness of breath, with repeat bmet in 2 weeks.  Chest x-ray was ordered.  Was told to follow-up in 2 months.   Was admitted in February 2024 due to syncope with A-flutter and bradycardia.  Patient was noted to have 2 syncopal episodes on the morning of admission, both were witnessed.  Husband had reported she had poor appetite prior to event.  On arrival to ED, heart rate was in  50-60 range, EKG showed a flutter with 4:1 block cardiology was consulted.  CT of the head and neck was negative for anything acute.  Chest x-ray and x-ray of shoulder showed nothing acute.  Was transferred to [facility] for further workup and EP evaluation.  Cardiology recommended to hold AV nodal agents continue Coumadin. Echocardiogram showed normal EF, mild LVH, moderately elevated PASP at 54.3 mmHg.  Carotid Doppler showed mild 1-49% stenosis of proximal right ICA, no significant stenosis in left ICA.  EP was consulted and recommended checking orthostatics and resuming beta-blocker.  Was started on metoprolol tartrate 25 mg twice daily, cardiology recommended to hold diltiazem and ARB, was told to resume torsemide 20 mg daily at discharge.  14-day Zio patch was arranged and discussed to follow-up with outpatient cardiology 1 to 2 weeks.  Today she presents for outpatient follow-up with her husband.  She denies any recurrent syncopal episodes. Overall doing well from a cardiac perspective. Does report some chronic right shoulder pain. BP has been well controlled. Husband does state HR has been elevated at times. Still wearing heart monitor. Denies any chest pain, shortness of breath, palpitations, syncope, presyncope, dizziness, orthopnea, PND, swelling or significant weight changes, acute bleeding, or claudication.  Past Medical History:  Diagnosis Date   Anemia    Asthma    Atrial fibrillation ([place])    Breast carcinoma ([place]) 1995   [id number]   Cardioembolic stroke ([place]) [id number]   Right frontal in 01/2012; normal carotid ultrasound; possible LAA thrombus by TEE;  virtual complete neurologic recovery   Chronic kidney disease, stage 2, mildly decreased GFR    GFR of approximately 60   Depression    Diabetes mellitus without complication ([place])    Diverticulosis of colon (without mention of hemorrhage) 2012   Dr. Laural Golden   Fasting hyperglycemia    120 fasting   Gastroesophageal reflux disease    Gastroparesis     Hemorrhoids    Hyperlipidemia    Hypertension    pt denies 05/30/13     Dr Kathe Mariner   Hyponatremia    Rheumatic heart disease    a. 07/2013 Echo: Ef 55-60%, Mod AS/AI, Mod-Sev MS, sev dil LA, PASP 58;  b. 07/2013 TEE: EF 35-40%, mild-mod AS/AI, mod-sev MS, LA smoke;  c. 07/2013 Cath: elev R heart pressures, nl cors.   Shortness of breath     Past Surgical History:  Procedure Laterality Date   AORTIC VALVE REPLACEMENT N/A 07/18/2013   Procedure: AORTIC VALVE REPLACEMENT (AVR);  Surgeon: Gaye Pollack, MD;  Location: [place];  Service: Open Heart Surgery;  Laterality: N/A;   BACK SURGERY     BREAST LUMPECTOMY Right 1995   CARDIAC CATHETERIZATION     CARDIOVERSION N/A 07/26/2013   Procedure: CARDIOVERSION;  Surgeon: Fay Records, MD;  Location: [place];  Service: Cardiovascular;  Laterality: N/A;   COLONOSCOPY  2012   Negative screening procedure   DILATION AND CURETTAGE OF UTERUS     ESOPHAGEAL MANOMETRY N/A 06/17/2013   Procedure: ESOPHAGEAL MANOMETRY (EM);  Surgeon: Sable Feil, MD;  Location: [facility];  Service: Endoscopy;  Laterality: N/A;   INTRAOPERATIVE TRANSESOPHAGEAL ECHOCARDIOGRAM N/A 07/18/2013   Procedure: INTRAOPERATIVE TRANSESOPHAGEAL ECHOCARDIOGRAM;  Surgeon: Gaye Pollack, MD;  Location: [place] [facility];  Service: Open Heart Surgery;  Laterality: N/A;   LEFT AND RIGHT HEART CATHETERIZATION WITH CORONARY ANGIOGRAM N/A 07/10/2013   Procedure: LEFT AND RIGHT HEART CATHETERIZATION WITH CORONARY ANGIOGRAM;  Surgeon: Burnell Blanks, MD;  Location: [facility] CATH LAB;  Service: Cardiovascular;  Laterality: N/A;   LUMBAR LAMINECTOMY/DECOMPRESSION MICRODISCECTOMY Right 05/13/2014   Procedure: LUMBAR LAMINECTOMY/DECOMPRESSION MICRODISCECTOMY 1 LEVEL  lumbar four/five;  Surgeon: Faythe Ghee, MD;  Location: [facility];  Service: Neurosurgery;  Laterality: Right;   MITRAL VALVE REPLACEMENT N/A 07/18/2013   Procedure: MITRAL VALVE (MV) REPLACEMENT;  Surgeon: Gaye Pollack, MD;  Location: [place] [facility];  Service: Open Heart Surgery;  Laterality: N/A;   MITRAL VALVE [place], closed mitral valvulotomy by finger fracture   TEE WITHOUT CARDIOVERSION  01/24/2012   Procedure: TRANSESOPHAGEAL ECHOCARDIOGRAM (TEE);  Surgeon: Lelon Perla, MD;  Location: [facility] ENDOSCOPY;  Service: Cardiovascular;  Laterality: N/A;   TEE WITHOUT CARDIOVERSION N/A 07/11/2013   Procedure: TRANSESOPHAGEAL ECHOCARDIOGRAM (TEE);  Surgeon: Thayer Headings, MD;  Location: [facility];  Service: Cardiovascular;  Laterality: N/A;   TEE WITHOUT CARDIOVERSION N/A 07/26/2013   Procedure: TRANSESOPHAGEAL ECHOCARDIOGRAM (TEE);  Surgeon: Fay Records, MD;  Location: [facility] ENDOSCOPY;  Service: Cardiovascular;  Laterality: N/A;   TUBAL LIGATION  1973    Current Medications: Current Meds  Medication Sig   albuterol (PROVENTIL) (2.5 MG/3ML) 0.083% nebulizer solution Take 3 mLs (2.5 mg total) by nebulization every 4 (four) hours as needed for wheezing or shortness of breath. (Patient taking differently: Take 2.5 mg by nebulization 2 (two) times daily as needed for wheezing or shortness of breath.)   albuterol (VENTOLIN HFA) 108 (90 Base) MCG/ACT inhaler Inhale 1-2 puffs into the  lungs every 6 (six) hours as needed for wheezing or shortness of breath.   atorvastatin (LIPITOR) 40 MG tablet TAKE ONE TABLET BY MOUTH ONCE DAILY. (Patient taking differently: Take 40 mg by mouth daily.)   budesonide (PULMICORT) 0.5 MG/2ML nebulizer solution USE 1/2 VIAL IN NEBULIZER TWICE DAILY. DISCARD UNUSED PORTION AFTER EACH USE. (Patient taking differently: Take 0.25 mg by nebulization 2 (two) times daily. USE 1/2 VIAL IN NEBULIZER TWICE DAILY. DISCARD UNUSED PORTION AFTER EACH USE.)   cetirizine (ZYRTEC) 10 MG tablet Take 10 mg by mouth daily.   dextromethorphan-guaiFENesin (MUCINEX DM) 30-600 MG 12hr tablet Take 1 tablet by mouth 2 (two) times daily as needed for cough.   escitalopram (LEXAPRO) 10 MG tablet Take  10 mg by mouth daily.    famotidine (PEPCID) 20 MG tablet TAKE 1 TABLET AFTER SUPPER (Patient taking differently: Take 20 mg by mouth every evening. TAKE 1 TABLET AFTER SUPPER)   fluticasone (FLONASE) 50 MCG/ACT nasal spray Place 1 spray into both nostrils daily.   gabapentin (NEURONTIN) 100 MG capsule TAKE 1 CAPSULE BY MOUTH FOUR TIMES DAILY. (Patient taking differently: Take 100 mg by mouth 4 (four) times daily.)   HYDROcodone-acetaminophen (NORCO/VICODIN) 5-325 MG tablet Take 1 tablet by mouth every 6 (six) hours as needed for moderate pain or severe pain.   levothyroxine (SYNTHROID) 100 MCG tablet Take 100 mcg by mouth daily before breakfast.   metFORMIN (GLUCOPHAGE) 500 MG tablet Take 1 tablet (500 mg total) by mouth 2 (two) times daily with a meal.   metoprolol tartrate (LOPRESSOR) 25 MG tablet Take 1 tablet (25 mg total) by mouth 2 (two) times daily. (Patient taking differently: Take 25 mg by mouth 2 (two) times daily. Take 1 tablet by mouth 2 times a day.   Multiple Vitamin (MULITIVITAMIN WITH MINERALS) TABS Take 1 tablet by mouth daily.   pantoprazole (PROTONIX) 40 MG tablet TAKE 1 TABLET 30 TO 60 MINUTES BEFORE FIRST MEAL OF THE DAY. (Patient taking differently: Take 40 mg by mouth daily before breakfast.)   potassium chloride SA (KLOR-CON M) 20 MEQ tablet Take 1 tablet (20 mEq total) by mouth daily.   predniSONE (DELTASONE) 10 MG tablet Prednisone dosing: Take  Prednisone '40mg'$  (4 tabs) x 6 days, then taper to '30mg'$  (3 tabs) x 3 days, then '20mg'$  (2 tabs) x 3days, then '10mg'$  (1 tab) x 3days, then OFF.   torsemide (DEMADEX) 20 MG tablet Take 1 tablet (20 mg total) by mouth daily.   warfarin (COUMADIN) 2.5 MG tablet TAKE AS DIRECTED BY COUMADIN CLINIC. (Patient taking differently: Take 2.5-3.75 mg by mouth See admin instructions. Take 2.'5mg'$  by mouth daily except on Sunday and Thursday. On Sunday and Thursday, take 3.'75mg'$  by mouth.)     Allergies:   Amiodarone   Social History   Socioeconomic  History   Marital status: Married    Spouse name: Not on file   Number of children: 1   Years of education: Not on file   Highest education level: Not on file  Occupational History   Occupation: Retired Product manager: RETIRED  Tobacco Use   Smoking status: Never   Smokeless tobacco: Never  Vaping Use   Vaping Use: Never used  Substance and Sexual Activity   Alcohol use: No    Alcohol/week: 0.0 standard drinks of alcohol   Drug use: No   Sexual activity: Not Currently  Other Topics Concern   Not on file  Social History Narrative   Married with  children   No regular exercise   Social Determinants of Health   Financial Resource Strain: Not on file  Food Insecurity: No Food Insecurity (11/11/2022)   Hunger Vital Sign    Worried About Running Out of Food in the Last Year: Never true    Ran Out of Food in the Last Year: Never true  Transportation Needs: No Transportation Needs (11/11/2022)   PRAPARE - Hydrologist (Medical): No    Lack of Transportation (Non-Medical): No  Physical Activity: Not on file  Stress: Not on file  Social Connections: Not on file     Family History: The patient's family history includes Asthma in her maternal grandfather; Cirrhosis in her father; Diabetes in her sister; Heart disease (age of onset: 66) in her brother; Hypothyroidism in her mother; Rheum arthritis in her maternal grandmother.  ROS:   Please see the history of present illness.     All other systems reviewed and are negative.  EKGs/Labs/Other Studies Reviewed:    The following studies were reviewed today:   EKG:  EKG is not ordered today.    Cardiac monitor pending  Echocardiogram on 11/12/2022:  1. Left ventricular ejection fraction, by estimation, is 55 to 60%. The  left ventricle has normal function. The left ventricle has no regional  wall motion abnormalities. There is mild left ventricular hypertrophy.  Indeterminate diastolic filling due  to  E-A fusion.   2. Right ventricular systolic function was not well visualized. The right  ventricular size is normal. There is moderately elevated pulmonary artery  systolic pressure. The estimated right ventricular systolic pressure is  [id number] mmHg.   3. Left atrial size was mildly dilated.   4. The mitral valve has been repaired/replaced. No evidence of mitral  valve regurgitation. No evidence of mitral stenosis. The mean mitral valve  gradient is 7.0 mmHg at HR of 105 bpm. There is a 25 mm Magna Ease  bioprosthetic valve present in the mitral   position. Procedure Date: 2014. Grossly normal function.   5. The aortic valve has been repaired/replaced. Aortic valve  regurgitation is not visualized. No aortic stenosis is present. There is a  21 mm Magna Ease valve present in the aortic position. Procedure Date:  2014. Aortic valve mean gradient measures 10.0   mmHg. Aortic valve Vmax measures 2.13 m/s. Grossly normal function.   6. The inferior vena cava is normal in size with greater than 50%  respiratory variability, suggesting right atrial pressure of 3 mmHg.   Comparison(s): No significant change from prior study.  Carotid duplex on 11/11/2022: IMPRESSION: 1. Mild (1-49%) stenosis proximal right internal carotid artery secondary to trace heterogeneous atherosclerotic plaque. 2. No significant atherosclerotic plaque or evidence of stenosis in the left internal carotid artery. 3. Vertebral arteries are patent with normal antegrade flow.   Myoview on 01/28/2021: Atrial fibrillation present throughout. No diagnostic ST segment changes to indicate ischemia. Small, mild to moderate intensity, mid to apical anterior defect. This is fixed with the exception of a small degree of partial reversibility in the mid zone. Suggestive of scar versus variable soft tissue attenuation. This is a low risk study. Nuclear stress EF: 50%.  Recent Labs: 11/11/2022: ALT 22; B Natriuretic Peptide 137.0;  TSH 4.932 11/12/2022: Hemoglobin 12.9; Magnesium 1.7; Platelets 158 11/14/2022: BUN 18; Creatinine, Ser 1.09; Potassium 4.0; Sodium 134  Recent Lipid Panel    Component Value Date/Time   CHOL 185 06/03/2021 0538   TRIG 75 06/03/2021  0538   HDL 66 06/03/2021 0538   CHOLHDL 2.8 06/03/2021 0538   VLDL 15 06/03/2021 0538   LDLCALC 104 (H) 06/03/2021 0538     Risk Assessment/Calculations:    CHA2DS2-VASc Score = 8  This indicates a 10.8% annual risk of stroke. The patient's score is based upon: CHF History: 1 HTN History: 1 Diabetes History: 0 Stroke History: 2 Vascular Disease History: 1 Age Score: 2 Gender Score: 1     Physical Exam:    VS:  BP 117/77 (BP Location: Left Arm, Patient Position: Sitting, Cuff Size: Normal) Comment: automatic machine  Pulse (!) 108   Ht 5' 2.5" (1.588 m)   Wt 164 lb (74.4 kg)   SpO2 98%   BMI 29.52 kg/m     Wt Readings from Last 3 Encounters:  11/23/22 164 lb (74.4 kg)  11/14/22 166 lb 12.8 oz (75.7 kg)  10/10/22 166 lb 3.2 oz (75.4 kg)     GEN: Well nourished, well developed in no acute distress HEENT: Normal NECK: No JVD; No carotid bruits CARDIAC: S1/S2, RRR, no murmurs, rubs, gallops RESPIRATORY:  Clear to auscultation without rales, wheezing or rhonchi  MUSCULOSKELETAL:  Nonpitting BLE edema; No deformity  SKIN: Warm and dry NEUROLOGIC:  Alert and oriented x 3 PSYCHIATRIC:  Flat affect, withdrawn  ASSESSMENT:    1. History of syncope   2. Chronic diastolic heart failure (HCC)   3. Valvular heart disease   4. S/P MVR (mitral valve replacement)   5. Status post aortic valve and mitral valve replacement   6. Atrial fibrillation, unspecified type ([place])   7. Atrial flutter, unspecified type ([place])   8. Tachycardia   9. Essential hypertension, benign   10. Hyperlipidemia, unspecified hyperlipidemia type   11. Stenosis of right carotid artery   12. History of CVA (cerebrovascular accident)    PLAN:    In order of problems  listed above:  Hx of syncope Etiology multifactorial. CT head/ neck, CXR, x-ray of shoulder unremarkable. Cardiology recommended holding AV nodal agents. TTE -> normal EF, mild LVH, moderately elevated PASP at 54.3 mmHg.  Carotid Doppler -> mild 1-49% stenosis of proximal right ICA, no significant stenosis in left ICA.  Was started on metoprolol tartrate 25 mg twice daily, recommended to hold diltiazem and ARB. 14-day Zio patch will come off next week. Denies any recurrent syncopal episodes. Will refer to EP and adjust Metoprolol as mentioned below.   Chronic diastolic CHF Recent TTE showed normal EF, no WMA, mild LVH. Euvolemic and well compensated on exam. Continue Lopressor, Torsemide, and potassium supplementation. Low sodium diet, fluid restriction <2L, and daily weights encouraged. Educated to contact our office for weight gain of 2 lbs overnight or 5 lbs in one week. Heart healthy diet and regular cardiovascular exercise encouraged. Encouraged compression stockings for lower extremity edema.   Valvular heart disease, s/p MVR and AVR She is s/p mitral valve replacement and aortic valve replacement in 2014. Grossly normal function of valves. Denies any symptoms. Continue current medication regimen. Heart healthy diet and regular cardiovascular exercise encouraged. Does require SBE prior to dental work.     A-fib, A-flutter tachycardia Patient denies any palpitations. Husband does admit to tachycardia, with HR up to 115-117 at times. HR today 108 bpm. Monitor is still on. Documentation reveals pt was in persistent A-flutter on 11/14/2022. Continue current meds. May take an extra tablet of Metoprolol as needed when heart rate is greater than 100 BPM and top number of blood pressure  is greater than 120. Discussed avoiding triggers. Will refer to EP.  Continue Coumadin. Heart healthy diet and regular cardiovascular exercise encouraged.   HTN BP stable. BP has been stable at home. Discussed to monitor  BP at home at least 2 hours after medications and sitting for 5-10 minutes. Continue current meds. Heart healthy diet and regular cardiovascular exercise encouraged.   HLD, Carotid artery stenosis LDL 104, 1 year ago. At next visit, plan to repeat lipid profile and uptitrate atorvastatin. Mild stenosis along R ICA, no significant plaque or stenosis along left ICA. Continue current meds. Heart healthy diet and regular cardiovascular exercise encouraged.   Hx of CVA Denies any stroke-like symptoms. Continue coumadin. Continue to follow with PCP. Heart healthy diet and regular cardiovascular exercise encouraged.   8. Disposition: Follow-up with me or APP in 6-8 weeks or sooner if anything changes.    Medication Adjustments/Labs and Tests Ordered: Current medicines are reviewed at length with the patient today.  Concerns regarding medicines are outlined above.  Orders Placed This Encounter  Procedures   Ambulatory referral to Cardiac Electrophysiology   No orders of the defined types were placed in this encounter.   Patient Instructions  Medication Instructions:  May take an extra tablet of Metoprolol as needed when heart rate is greater than 100 BPM and top number of blood pressure is greater than 120  *If you need a refill on your cardiac medications before your next appointment, please call your pharmacy*  Lab Work: NONE ordered at this time of appointment   If you have labs (blood work) drawn today and your tests are completely normal, you will receive your results only by: [place] (if you have MyChart) OR A paper copy in the mail If you have any lab test that is abnormal or we need to change your treatment, we will call you to review the results.  Testing/Procedures: NONE ordered at this time of appointment   Follow-Up: At [facility], you and your health needs are our priority.  As part of our continuing mission to provide you with exceptional heart care, we  have created designated Provider Care Teams.  These Care Teams include your primary Cardiologist (physician) and Advanced Practice Providers (APPs -  Physician Assistants and Nurse Practitioners) who all work together to provide you with the care you need, when you need it.   Your next appointment:   6-8 week(s)  Provider:   Finis Bud, NP   Other Instructions Monitor blood pressure and heart rate (pulse) at home.  You have been referred to Electrophysiology    Signed, Finis Bud, NP  11/26/2022 11:58 AM    [place]

## 2022-11-23 NOTE — Patient Instructions (Signed)
Continue warfarin 1 tablet daily except for 1.5 tablets on Sundays and Thursdays. Recheck INR in 4 weeks.

## 2022-11-28 DIAGNOSIS — I4892 Unspecified atrial flutter: Secondary | ICD-10-CM | POA: Diagnosis not present

## 2022-11-28 DIAGNOSIS — M75111 Incomplete rotator cuff tear or rupture of right shoulder, not specified as traumatic: Secondary | ICD-10-CM | POA: Diagnosis not present

## 2022-11-28 DIAGNOSIS — M19011 Primary osteoarthritis, right shoulder: Secondary | ICD-10-CM | POA: Diagnosis not present

## 2022-11-28 DIAGNOSIS — I6521 Occlusion and stenosis of right carotid artery: Secondary | ICD-10-CM | POA: Diagnosis not present

## 2022-11-28 DIAGNOSIS — I110 Hypertensive heart disease with heart failure: Secondary | ICD-10-CM | POA: Diagnosis not present

## 2022-11-28 DIAGNOSIS — M7521 Bicipital tendinitis, right shoulder: Secondary | ICD-10-CM | POA: Diagnosis not present

## 2022-11-28 DIAGNOSIS — M7581 Other shoulder lesions, right shoulder: Secondary | ICD-10-CM | POA: Diagnosis not present

## 2022-11-28 DIAGNOSIS — G8929 Other chronic pain: Secondary | ICD-10-CM | POA: Diagnosis not present

## 2022-11-28 DIAGNOSIS — I5032 Chronic diastolic (congestive) heart failure: Secondary | ICD-10-CM | POA: Diagnosis not present

## 2022-11-29 ENCOUNTER — Other Ambulatory Visit: Payer: Self-pay | Admitting: Internal Medicine

## 2022-11-29 DIAGNOSIS — G8929 Other chronic pain: Secondary | ICD-10-CM | POA: Diagnosis not present

## 2022-11-29 DIAGNOSIS — M7521 Bicipital tendinitis, right shoulder: Secondary | ICD-10-CM | POA: Diagnosis not present

## 2022-11-29 DIAGNOSIS — I5032 Chronic diastolic (congestive) heart failure: Secondary | ICD-10-CM | POA: Diagnosis not present

## 2022-11-29 DIAGNOSIS — I4892 Unspecified atrial flutter: Secondary | ICD-10-CM | POA: Diagnosis not present

## 2022-11-29 DIAGNOSIS — M7581 Other shoulder lesions, right shoulder: Secondary | ICD-10-CM | POA: Diagnosis not present

## 2022-11-29 DIAGNOSIS — M19011 Primary osteoarthritis, right shoulder: Secondary | ICD-10-CM | POA: Diagnosis not present

## 2022-11-29 DIAGNOSIS — R0609 Other forms of dyspnea: Secondary | ICD-10-CM

## 2022-11-29 DIAGNOSIS — I110 Hypertensive heart disease with heart failure: Secondary | ICD-10-CM | POA: Diagnosis not present

## 2022-11-29 DIAGNOSIS — M75111 Incomplete rotator cuff tear or rupture of right shoulder, not specified as traumatic: Secondary | ICD-10-CM | POA: Diagnosis not present

## 2022-11-29 DIAGNOSIS — I6521 Occlusion and stenosis of right carotid artery: Secondary | ICD-10-CM | POA: Diagnosis not present

## 2022-12-01 ENCOUNTER — Encounter: Payer: Self-pay | Admitting: Radiology

## 2022-12-01 DIAGNOSIS — M25512 Pain in left shoulder: Secondary | ICD-10-CM | POA: Diagnosis not present

## 2022-12-02 DIAGNOSIS — M7521 Bicipital tendinitis, right shoulder: Secondary | ICD-10-CM | POA: Diagnosis not present

## 2022-12-02 DIAGNOSIS — G8929 Other chronic pain: Secondary | ICD-10-CM | POA: Diagnosis not present

## 2022-12-02 DIAGNOSIS — I6521 Occlusion and stenosis of right carotid artery: Secondary | ICD-10-CM | POA: Diagnosis not present

## 2022-12-02 DIAGNOSIS — I4892 Unspecified atrial flutter: Secondary | ICD-10-CM | POA: Diagnosis not present

## 2022-12-02 DIAGNOSIS — I5032 Chronic diastolic (congestive) heart failure: Secondary | ICD-10-CM | POA: Diagnosis not present

## 2022-12-02 DIAGNOSIS — M19011 Primary osteoarthritis, right shoulder: Secondary | ICD-10-CM | POA: Diagnosis not present

## 2022-12-02 DIAGNOSIS — M7581 Other shoulder lesions, right shoulder: Secondary | ICD-10-CM | POA: Diagnosis not present

## 2022-12-02 DIAGNOSIS — I110 Hypertensive heart disease with heart failure: Secondary | ICD-10-CM | POA: Diagnosis not present

## 2022-12-02 DIAGNOSIS — M75111 Incomplete rotator cuff tear or rupture of right shoulder, not specified as traumatic: Secondary | ICD-10-CM | POA: Diagnosis not present

## 2022-12-05 DIAGNOSIS — I4892 Unspecified atrial flutter: Secondary | ICD-10-CM | POA: Diagnosis not present

## 2022-12-05 DIAGNOSIS — M7581 Other shoulder lesions, right shoulder: Secondary | ICD-10-CM | POA: Diagnosis not present

## 2022-12-05 DIAGNOSIS — I5032 Chronic diastolic (congestive) heart failure: Secondary | ICD-10-CM | POA: Diagnosis not present

## 2022-12-05 DIAGNOSIS — M7521 Bicipital tendinitis, right shoulder: Secondary | ICD-10-CM | POA: Diagnosis not present

## 2022-12-05 DIAGNOSIS — I6521 Occlusion and stenosis of right carotid artery: Secondary | ICD-10-CM | POA: Diagnosis not present

## 2022-12-05 DIAGNOSIS — I110 Hypertensive heart disease with heart failure: Secondary | ICD-10-CM | POA: Diagnosis not present

## 2022-12-05 DIAGNOSIS — M19011 Primary osteoarthritis, right shoulder: Secondary | ICD-10-CM | POA: Diagnosis not present

## 2022-12-05 DIAGNOSIS — G8929 Other chronic pain: Secondary | ICD-10-CM | POA: Diagnosis not present

## 2022-12-05 DIAGNOSIS — M75111 Incomplete rotator cuff tear or rupture of right shoulder, not specified as traumatic: Secondary | ICD-10-CM | POA: Diagnosis not present

## 2022-12-08 NOTE — Addendum Note (Signed)
Encounter addended by: Markus Daft A on: 12/08/2022 3:34 PM  Actions taken: Imaging Exam ended

## 2022-12-09 DIAGNOSIS — I6521 Occlusion and stenosis of right carotid artery: Secondary | ICD-10-CM | POA: Diagnosis not present

## 2022-12-09 DIAGNOSIS — M19011 Primary osteoarthritis, right shoulder: Secondary | ICD-10-CM | POA: Diagnosis not present

## 2022-12-09 DIAGNOSIS — M75111 Incomplete rotator cuff tear or rupture of right shoulder, not specified as traumatic: Secondary | ICD-10-CM | POA: Diagnosis not present

## 2022-12-09 DIAGNOSIS — G8929 Other chronic pain: Secondary | ICD-10-CM | POA: Diagnosis not present

## 2022-12-09 DIAGNOSIS — M7581 Other shoulder lesions, right shoulder: Secondary | ICD-10-CM | POA: Diagnosis not present

## 2022-12-09 DIAGNOSIS — I4892 Unspecified atrial flutter: Secondary | ICD-10-CM | POA: Diagnosis not present

## 2022-12-09 DIAGNOSIS — I110 Hypertensive heart disease with heart failure: Secondary | ICD-10-CM | POA: Diagnosis not present

## 2022-12-09 DIAGNOSIS — M7521 Bicipital tendinitis, right shoulder: Secondary | ICD-10-CM | POA: Diagnosis not present

## 2022-12-09 DIAGNOSIS — I5032 Chronic diastolic (congestive) heart failure: Secondary | ICD-10-CM | POA: Diagnosis not present

## 2022-12-12 DIAGNOSIS — G8929 Other chronic pain: Secondary | ICD-10-CM | POA: Diagnosis not present

## 2022-12-12 DIAGNOSIS — I110 Hypertensive heart disease with heart failure: Secondary | ICD-10-CM | POA: Diagnosis not present

## 2022-12-12 DIAGNOSIS — M75111 Incomplete rotator cuff tear or rupture of right shoulder, not specified as traumatic: Secondary | ICD-10-CM | POA: Diagnosis not present

## 2022-12-12 DIAGNOSIS — I5032 Chronic diastolic (congestive) heart failure: Secondary | ICD-10-CM | POA: Diagnosis not present

## 2022-12-12 DIAGNOSIS — M7521 Bicipital tendinitis, right shoulder: Secondary | ICD-10-CM | POA: Diagnosis not present

## 2022-12-12 DIAGNOSIS — M19011 Primary osteoarthritis, right shoulder: Secondary | ICD-10-CM | POA: Diagnosis not present

## 2022-12-12 DIAGNOSIS — M7581 Other shoulder lesions, right shoulder: Secondary | ICD-10-CM | POA: Diagnosis not present

## 2022-12-12 DIAGNOSIS — I6521 Occlusion and stenosis of right carotid artery: Secondary | ICD-10-CM | POA: Diagnosis not present

## 2022-12-12 DIAGNOSIS — I4892 Unspecified atrial flutter: Secondary | ICD-10-CM | POA: Diagnosis not present

## 2022-12-13 DIAGNOSIS — M19011 Primary osteoarthritis, right shoulder: Secondary | ICD-10-CM | POA: Diagnosis not present

## 2022-12-13 DIAGNOSIS — I110 Hypertensive heart disease with heart failure: Secondary | ICD-10-CM | POA: Diagnosis not present

## 2022-12-13 DIAGNOSIS — M5412 Radiculopathy, cervical region: Secondary | ICD-10-CM | POA: Diagnosis not present

## 2022-12-13 DIAGNOSIS — G8929 Other chronic pain: Secondary | ICD-10-CM | POA: Diagnosis not present

## 2022-12-13 DIAGNOSIS — M75111 Incomplete rotator cuff tear or rupture of right shoulder, not specified as traumatic: Secondary | ICD-10-CM | POA: Diagnosis not present

## 2022-12-13 DIAGNOSIS — I4892 Unspecified atrial flutter: Secondary | ICD-10-CM | POA: Diagnosis not present

## 2022-12-13 DIAGNOSIS — M7521 Bicipital tendinitis, right shoulder: Secondary | ICD-10-CM | POA: Diagnosis not present

## 2022-12-13 DIAGNOSIS — I5032 Chronic diastolic (congestive) heart failure: Secondary | ICD-10-CM | POA: Diagnosis not present

## 2022-12-13 DIAGNOSIS — M7581 Other shoulder lesions, right shoulder: Secondary | ICD-10-CM | POA: Diagnosis not present

## 2022-12-13 DIAGNOSIS — I6521 Occlusion and stenosis of right carotid artery: Secondary | ICD-10-CM | POA: Diagnosis not present

## 2022-12-15 ENCOUNTER — Ambulatory Visit: Payer: Medicare PPO | Admitting: Cardiology

## 2022-12-19 ENCOUNTER — Ambulatory Visit: Payer: Medicare PPO | Attending: Cardiovascular Disease | Admitting: Cardiovascular Disease

## 2022-12-19 ENCOUNTER — Encounter: Payer: Self-pay | Admitting: Cardiovascular Disease

## 2022-12-19 ENCOUNTER — Other Ambulatory Visit: Payer: Self-pay

## 2022-12-19 VITALS — BP 112/72 | HR 116 | Ht 62.5 in | Wt 162.6 lb

## 2022-12-19 DIAGNOSIS — I484 Atypical atrial flutter: Secondary | ICD-10-CM | POA: Diagnosis not present

## 2022-12-19 DIAGNOSIS — Z01812 Encounter for preprocedural laboratory examination: Secondary | ICD-10-CM

## 2022-12-19 DIAGNOSIS — I5032 Chronic diastolic (congestive) heart failure: Secondary | ICD-10-CM | POA: Diagnosis not present

## 2022-12-19 MED ORDER — METOPROLOL TARTRATE 25 MG PO TABS: 75.0000 mg | ORAL_TABLET | Freq: Two times a day (BID) | ORAL | 1 refills | Status: DC

## 2022-12-19 NOTE — Patient Instructions (Signed)
Medication Instructions:  Your physician has recommended you make the following change in your medication:   1) INCREASE metoprolol tartrate (Lopressor) to 75mg  twice daily  *If you need a refill on your cardiac medications before your next appointment, please call your pharmacy*  Lab Work: Monday January 02, 2023: CBC, BMET, PT/INR (at [facility]) If you have labs (blood work) drawn today and your tests are completely normal, you will receive your results only by: [place] (if you have MyChart) OR A paper copy in the mail If you have any lab test that is abnormal or we need to change your treatment, we will call you to review the results.  Testing/Procedures: Your physician has recommended that you have a Cardioversion (DCCV). Electrical Cardioversion uses a jolt of electricity to your heart either through paddles or wired patches attached to your chest. This is a controlled, usually prescheduled, procedure. Defibrillation is done under light anesthesia in the hospital, and you usually go home the day of the procedure. This is done to get your heart back into a normal rhythm. You are not awake for the procedure. Please see the instruction sheet given to you today.   Follow-Up: At [facility] you and your health needs are our priority.  As part of our continuing mission to provide you with exceptional heart care, we have created designated Provider Care Teams.  These Care Teams include your primary Cardiologist (physician) and Advanced Practice Providers (APPs -  Physician Assistants and Nurse Practitioners) who all work together to provide you with the care you need, when you need it.  Your next appointment:   4-5 week(s)  Provider:   You may see Melida Quitter, MD or one of the following Advanced Practice Providers on your designated Care Team:   Tommye Standard, [place] [address] [place], [place] Mamie Levers, NP  Other Instructions   Dear COPPER WITKIN  You are  scheduled for a Cardioversion on Wednesday, April 3 with Dr. Gwyndolyn Kaufman.  Please arrive at the [facility] (Main Entrance A) at [facility]: [address] [place], [place] [address] at 10:30 AM.   DIET:  Nothing to eat or drink after midnight except a sip of water with medications (see medication instructions below)  MEDICATION INSTRUCTIONS: Continue taking your anticoagulant (blood thinner): Warfarin (Coumadin).  You will need to continue this after your procedure until you are told by your provider that it is safe to stop.    LABS: CBC, BMET, PT/INR at [place], [place], [place], [place] on Monday January 02, 2023.  FYI:  For your safety, and to allow [place] to monitor your vital signs accurately during the surgery/procedure we request: If you have artificial nails, gel coating, SNS etc, please have those removed prior to your surgery/procedure. Not having the nail coverings /polish removed may result in cancellation or delay of your surgery/procedure.  You must have a responsible person to drive you home and stay in the waiting area during your procedure. Failure to do so could result in cancellation.  Bring your insurance cards.  *Special Note: Every effort is made to have your procedure done on time. Occasionally there are emergencies that occur at the hospital that may cause delays. Please be patient if a delay does occur.

## 2022-12-19 NOTE — Progress Notes (Signed)
Electrophysiology Office Note:    Date:  12/19/2022   ID:  [name], DOB 02-26-1946, MRN [name]:[id number]  PCP:  [name], [place] [facility] Cardiologist:  Carlyle Dolly, MD     Referring MD: Finis Bud, NP   History of Present Illness:    [name] is a 77 y.o. female with a hx listed below, significant for atrial fibrillation and flutter, cardioembolic stroke, diabetes, hypertension, rheumatic heart disease status post replacement of both aortic and mitral valves with bioprosthetic devices replacement referred for arrhythmia management.  She is on Coumadin for prior stroke in the setting of rheumatic heart disease.  She was noted to have a possible left atrial thrombus at that time.  She was admitted in February 20 2:24 episodes of syncope.  She was noted to have atrial flutter with 4-1 block.  Syncopal episodes were preceded by shoulder pain, which was thought to be trigger for vasovagal episodes.  She was also noted to be bradycardic with 41 atrial flutter.  Prior to admission she was taking diltiazem 180 mg and metoprolol 37.5.  These medications were held.  Diltiazem was discontinued on discharge, but she remained on metoprolol 37.5 a ZIO monitor was subsequently placed that showed 100% burden of atrial flutter and an average heart rate of 105 bpm.  She has ongoing fatigue, shortness of breath.  She has a pulse ox monitor that reliably measured heart rates at about 110 bpm since discharge. No chest pain, no recurrence of syncope.  Past Medical History:  Diagnosis Date   Anemia    Asthma    Atrial fibrillation ([place])    Breast carcinoma ([place]) 1995   [id number]   Cardioembolic stroke ([place]) [id number]   Right frontal in 01/2012; normal carotid ultrasound; possible LAA thrombus by TEE; virtual complete neurologic recovery   Chronic kidney disease, stage 2, mildly decreased GFR    GFR of approximately 60   Depression    Diabetes mellitus without complication  ([place])    Diverticulosis of colon (without mention of hemorrhage) 2012   Dr. Laural Golden   Fasting hyperglycemia    120 fasting   Gastroesophageal reflux disease    Gastroparesis    Hemorrhoids    Hyperlipidemia    Hypertension    pt denies 05/30/13     Dr Kathe Mariner   Hyponatremia    Rheumatic heart disease    a. 07/2013 Echo: Ef 55-60%, Mod AS/AI, Mod-Sev MS, sev dil LA, PASP 58;  b. 07/2013 TEE: EF 35-40%, mild-mod AS/AI, mod-sev MS, LA smoke;  c. 07/2013 Cath: elev R heart pressures, nl cors.   Shortness of breath     Past Surgical History:  Procedure Laterality Date   AORTIC VALVE REPLACEMENT N/A 07/18/2013   Procedure: AORTIC VALVE REPLACEMENT (AVR);  Surgeon: Gaye Pollack, MD;  Location: [place];  Service: Open Heart Surgery;  Laterality: N/A;   BACK SURGERY     BREAST LUMPECTOMY Right 1995   CARDIAC CATHETERIZATION     CARDIOVERSION N/A 07/26/2013   Procedure: CARDIOVERSION;  Surgeon: Fay Records, MD;  Location: [place];  Service: Cardiovascular;  Laterality: N/A;   COLONOSCOPY  2012   Negative screening procedure   DILATION AND CURETTAGE OF UTERUS     ESOPHAGEAL MANOMETRY N/A 06/17/2013   Procedure: ESOPHAGEAL MANOMETRY (EM);  Surgeon: Sable Feil, MD;  Location: [facility];  Service: Endoscopy;  Laterality: N/A;   INTRAOPERATIVE TRANSESOPHAGEAL ECHOCARDIOGRAM N/A 07/18/2013   Procedure:  INTRAOPERATIVE TRANSESOPHAGEAL ECHOCARDIOGRAM;  Surgeon: Gaye Pollack, MD;  Location: [facility] OR;  Service: Open Heart Surgery;  Laterality: N/A;   LEFT AND RIGHT HEART CATHETERIZATION WITH CORONARY ANGIOGRAM N/A 07/10/2013   Procedure: LEFT AND RIGHT HEART CATHETERIZATION WITH CORONARY ANGIOGRAM;  Surgeon: Burnell Blanks, MD;  Location: [facility] CATH LAB;  Service: Cardiovascular;  Laterality: N/A;   LUMBAR LAMINECTOMY/DECOMPRESSION MICRODISCECTOMY Right 05/13/2014   Procedure: LUMBAR LAMINECTOMY/DECOMPRESSION MICRODISCECTOMY 1 LEVEL  lumbar four/five;  Surgeon: Faythe Ghee, MD;  [facility];  Service: Neurosurgery;  Laterality: Right;   MITRAL VALVE REPLACEMENT N/A 07/18/2013   Procedure: MITRAL VALVE (MV) REPLACEMENT;  Surgeon: Gaye Pollack, MD;  Location: [place] OR;  Service: Open Heart Surgery;  Laterality: N/A;   MITRAL VALVE [place], closed mitral valvulotomy by finger fracture   TEE WITHOUT CARDIOVERSION  01/24/2012   Procedure: TRANSESOPHAGEAL ECHOCARDIOGRAM (TEE);  Surgeon: Lelon Perla, MD;  Location: [facility] ENDOSCOPY;  Service: Cardiovascular;  Laterality: N/A;   TEE WITHOUT CARDIOVERSION N/A 07/11/2013   Procedure: TRANSESOPHAGEAL ECHOCARDIOGRAM (TEE);  Surgeon: Thayer Headings, MD;  Location: [place];  Service: Cardiovascular;  Laterality: N/A;   TEE WITHOUT CARDIOVERSION N/A 07/26/2013   Procedure: TRANSESOPHAGEAL ECHOCARDIOGRAM (TEE);  Surgeon: Fay Records, MD;  [facility] ENDOSCOPY;  Service: Cardiovascular;  Laterality: N/A;   TUBAL LIGATION  1973    Current Medications: Current Meds  Medication Sig   albuterol (PROVENTIL) (2.5 MG/3ML) 0.083% nebulizer solution Take 3 mLs (2.5 mg total) by nebulization every 4 (four) hours as needed for wheezing or shortness of breath. (Patient taking differently: Take 2.5 mg by nebulization 2 (two) times daily as needed for wheezing or shortness of breath.)   albuterol (VENTOLIN HFA) 108 (90 Base) MCG/ACT inhaler Inhale 1-2 puffs into the lungs every 6 (six) hours as needed for wheezing or shortness of breath.   atorvastatin (LIPITOR) 40 MG tablet TAKE ONE TABLET BY MOUTH ONCE DAILY. (Patient taking differently: Take 40 mg by mouth daily.)   budesonide (PULMICORT) 0.5 MG/2ML nebulizer solution USE 1/2 VIAL IN NEBULIZER TWICE DAILY. DISCARD UNUSED PORTION AFTER EACH USE. (Patient taking differently: Take 0.25 mg by nebulization 2 (two) times daily. USE 1/2 VIAL IN NEBULIZER TWICE DAILY. DISCARD UNUSED PORTION AFTER EACH USE.)   cetirizine (ZYRTEC) 10 MG tablet Take 10 mg by  mouth daily.   dextromethorphan-guaiFENesin (MUCINEX DM) 30-600 MG 12hr tablet Take 1 tablet by mouth 2 (two) times daily as needed for cough.   escitalopram (LEXAPRO) 10 MG tablet Take 10 mg by mouth daily.    famotidine (PEPCID) 20 MG tablet TAKE 1 TABLET AFTER SUPPER   fluticasone (FLONASE) 50 MCG/ACT nasal spray Place 1 spray into both nostrils daily.   gabapentin (NEURONTIN) 100 MG capsule TAKE 1 CAPSULE BY MOUTH FOUR TIMES DAILY. (Patient taking differently: Take 100 mg by mouth 4 (four) times daily.)   HYDROcodone-acetaminophen (NORCO/VICODIN) 5-325 MG tablet Take 1 tablet by mouth every 6 (six) hours as needed for moderate pain or severe pain.   levothyroxine (SYNTHROID) 100 MCG tablet Take 100 mcg by mouth daily before breakfast.   metFORMIN (GLUCOPHAGE) 500 MG tablet Take 1 tablet (500 mg total) by mouth 2 (two) times daily with a meal.   metoprolol tartrate (LOPRESSOR) 25 MG tablet Take 1 tablet (25 mg total) by mouth 2 (two) times daily. (Patient taking differently: Take 25 mg by mouth 2 (two) times daily. Take 1 tablet by mouth 2 times a day.May  take 1 extra tab as needed if HR greater than 100 BPM and SBP is greater than 120)   Multiple Vitamin (MULITIVITAMIN WITH MINERALS) TABS Take 1 tablet by mouth daily.   pantoprazole (PROTONIX) 40 MG tablet TAKE 1 TABLET 30 TO 60 MINUTES BEFORE FIRST MEAL OF THE DAY. (Patient taking differently: Take 40 mg by mouth daily before breakfast.)   predniSONE (DELTASONE) 10 MG tablet Prednisone dosing: Take  Prednisone 40mg  (4 tabs) x 6 days, then taper to 30mg  (3 tabs) x 3 days, then 20mg  (2 tabs) x 3days, then 10mg  (1 tab) x 3days, then OFF.   torsemide (DEMADEX) 20 MG tablet Take 1 tablet (20 mg total) by mouth daily.   warfarin (COUMADIN) 2.5 MG tablet TAKE AS DIRECTED BY COUMADIN CLINIC. (Patient taking differently: Take 2.5-3.75 mg by mouth See admin instructions. Take 2.5mg  by mouth daily except on Sunday and Thursday. On Sunday and Thursday, take  3.75mg  by mouth.)     Allergies:   Amiodarone   Social and Family History: Reviewed in Epic  ROS:   Please see the history of present illness.    All other systems reviewed and are negative.  EKGs/Labs/Other Studies Reviewed Today:    Echocardiogram:  TTE 11/12/2022  1. Left ventricular ejection fraction, by estimation, is 55 to 60%. The  left ventricle has normal function. The left ventricle has no regional  wall motion abnormalities. There is mild left ventricular hypertrophy.  Indeterminate diastolic filling due to  E-A fusion.   2. Right ventricular systolic function was not well visualized. The right  ventricular size is normal. There is moderately elevated pulmonary artery  systolic pressure. The estimated right ventricular systolic pressure is  [id number] mmHg.   3. Left atrial size was mildly dilated.   4. The mitral valve has been repaired/replaced. No evidence of mitral  valve regurgitation. No evidence of mitral stenosis. The mean mitral valve  gradient is 7.0 mmHg at HR of 105 bpm. There is a 25 mm Magna Ease  bioprosthetic valve present in the mitral  position. Procedure Date: 2014. Grossly normal function.   5. The aortic valve has been repaired/replaced. Aortic valve  regurgitation is not visualized. No aortic stenosis is present. There is a  21 mm Magna Ease valve present in the aortic position. Procedure Date:  2014. Aortic valve mean gradient measures 10.0  mmHg. Aortic valve Vmax measures 2.13 m/s. Grossly normal function.   6. The inferior vena cava is normal in size with greater than 50%  respiratory variability, suggesting right atrial pressure of 3 mmHg.   Monitors:  Zio Patch 11/2022 14 days Atrial flutter, 100% burden,  HR 55-150, avg 106 No patient-triggered events  Advanced imaging:   EKG:  Last EKG results: today -- atypical atrial flutter with 2:1 conduction, V-rate 116 bpm   Recent Labs: 11/11/2022: ALT 22; B Natriuretic Peptide 137.0; TSH  4.932 11/12/2022: Hemoglobin 12.9; Magnesium 1.7; Platelets 158 11/14/2022: BUN 18; Creatinine, Ser 1.09; Potassium 4.0; Sodium 134     Physical Exam:    VS:  BP 112/72   Pulse (!) 116   Ht 5' 2.5" (1.588 m)   Wt 162 lb 9.6 oz (73.8 kg)   SpO2 95%   BMI 29.27 kg/m     Wt Readings from Last 3 Encounters:  12/19/22 162 lb 9.6 oz (73.8 kg)  11/23/22 164 lb (74.4 kg)  11/14/22 166 lb 12.8 oz (75.7 kg)     GEN: Well nourished, well developed in no  acute distress CARDIAC: regular, tachy rhythm, no murmurs, rubs, gallops RESPIRATORY:  Normal work of breathing MUSCULOSKELETAL: trace edema    ASSESSMENT & PLAN:    Atrial flutter - longstanding persistent, likely atypical - it appears that she has been in atrial flutter for the past 3 years - atrial size is only mildly dilated - I think she deserves a shot at sinus rhythm since she is quite symptomatic -- DCCV - Increase metoprolol to 75 mg for rate control - Follow-up 2 to 3 weeks after cardioversion  Secondary hypercoagulable state - on warfarin for valvular AF  Syncope - situational syncope, triggered by shoulder pain - orthostatic during recent admission  Hypertension -Losartan discontinued due to hypotension, BP stable today        Medication Adjustments/Labs and Tests Ordered: Current medicines are reviewed at length with the patient today.  Concerns regarding medicines are outlined above.  No orders of the defined types were placed in this encounter.  No orders of the defined types were placed in this encounter.    Signed, Melida Quitter, MD  12/19/2022 10:06 AM    [place]

## 2022-12-19 NOTE — H&P (View-Only) (Signed)
Electrophysiology Office Note:    Date:  12/19/2022   ID:  [name], DOB 08/25/1946, MRN [id number]  PCP:  Fagan, Roy, MD   [facility] Providers Cardiologist:  Branch, Jonathan, MD     Referring MD: Peck, Elizabeth, NP   History of Present Illness:    [name] is a 76 y.o. female with a hx listed below, significant for atrial fibrillation and flutter, cardioembolic stroke, diabetes, hypertension, rheumatic heart disease status post replacement of both aortic and mitral valves with bioprosthetic devices replacement referred for arrhythmia management.  She is on Coumadin for prior stroke in the setting of rheumatic heart disease.  She was noted to have a possible left atrial thrombus at that time.  She was admitted in February 20 2:24 episodes of syncope.  She was noted to have atrial flutter with 4-1 block.  Syncopal episodes were preceded by shoulder pain, which was thought to be trigger for vasovagal episodes.  She was also noted to be bradycardic with 41 atrial flutter.  Prior to admission she was taking diltiazem 180 mg and metoprolol 37.5.  These medications were held.  Diltiazem was discontinued on discharge, but she remained on metoprolol 37.5 a ZIO monitor was subsequently placed that showed 100% burden of atrial flutter and an average heart rate of 105 bpm.  She has ongoing fatigue, shortness of breath.  She has a pulse ox monitor that reliably measured heart rates at about 110 bpm since discharge. No chest pain, no recurrence of syncope.  Past Medical History:  Diagnosis Date   Anemia    Asthma    Atrial fibrillation ([facility])    Breast carcinoma ([facility]) 1995   1995   Cardioembolic stroke ([facility]) 01/2012   Right frontal in 01/2012; normal carotid ultrasound; possible LAA thrombus by TEE; virtual complete neurologic recovery   Chronic kidney disease, stage 2, mildly decreased GFR    GFR of approximately 60   Depression    Diabetes mellitus without complication  ([facility])    Diverticulosis of colon (without mention of hemorrhage) 2012   Dr. Rehman   Fasting hyperglycemia    120 fasting   Gastroesophageal reflux disease    Gastroparesis    Hemorrhoids    Hyperlipidemia    Hypertension    pt denies 05/30/13     Dr Branch  [place]   Hyponatremia    Rheumatic heart disease    a. 07/2013 Echo: Ef 55-60%, Mod AS/AI, Mod-Sev MS, sev dil LA, PASP 58;  b. 07/2013 TEE: EF 35-40%, mild-mod AS/AI, mod-sev MS, LA smoke;  c. 07/2013 Cath: elev R heart pressures, nl cors.   Shortness of breath     Past Surgical History:  Procedure Laterality Date   AORTIC VALVE REPLACEMENT N/A 07/18/2013   Procedure: AORTIC VALVE REPLACEMENT (AVR);  Surgeon: Bryan K Bartle, MD;  [facility];  Service: Open Heart Surgery;  Laterality: N/A;   BACK SURGERY     BREAST LUMPECTOMY Right 1995   CARDIAC CATHETERIZATION     CARDIOVERSION N/A 07/26/2013   Procedure: CARDIOVERSION;  Surgeon: Paula V Ross, MD;  [facility]: [facility] ENDOS[facility];  Service: Cardiovascular;  Laterality: N/A;   COLONOSCOPY  2012   Negative screening procedure   DILATION AND CURETTAGE OF UTERUS     ESOPHAGEAL MANOMETRY N/A 06/17/2013   Procedure: ESOPHAGEAL MANOMETRY (EM);  Surgeon: David R Patterson, MD;  [facility];  Service: Endoscopy;  Laterality: N/A;   INTRAOPERATIVE TRANSESOPHAGEAL ECHOCARDIOGRAM N/A 07/18/2013   Procedure:   INTRAOPERATIVE TRANSESOPHAGEAL ECHOCARDIOGRAM;  Surgeon: Bryan K Bartle, MD;  [facility];  Service: Open Heart Surgery;  Laterality: N/A;   LEFT AND RIGHT HEART CATHETERIZATION WITH CORONARY ANGIOGRAM N/A 07/10/2013   Procedure: LEFT AND RIGHT HEART CATHETERIZATION WITH CORONARY ANGIOGRAM;  Surgeon: Christopher D McAlhany, MD;  [facility];  Service: Cardiovascular;  Laterality: N/A;   LUMBAR LAMINECTOMY/DECOMPRESSION MICRODISCECTOMY Right 05/13/2014   Procedure: LUMBAR LAMINECTOMY/DECOMPRESSION MICRODISCECTOMY 1 LEVEL  lumbar four/five;  Surgeon: Randy O  Kritzer, MD;  [facility];  Service: Neurosurgery;  Laterality: Right;   MITRAL VALVE REPLACEMENT N/A 07/18/2013   Procedure: MITRAL VALVE (MV) REPLACEMENT;  Surgeon: Bryan K Bartle, MD;  [facility];  Service: Open Heart Surgery;  Laterality: N/A;   MITRAL VALVE SURGERY  1962   [facility], closed mitral valvulotomy by finger fracture   TEE WITHOUT CARDIOVERSION  01/24/2012   Procedure: TRANSESOPHAGEAL ECHOCARDIOGRAM (TEE);  Surgeon: Brian S Crenshaw, MD;  [facility];  Service: Cardiovascular;  Laterality: N/A;   TEE WITHOUT CARDIOVERSION N/A 07/11/2013   Procedure: TRANSESOPHAGEAL ECHOCARDIOGRAM (TEE);  Surgeon: Philip J Nahser, MD;  [facility];  Service: Cardiovascular;  Laterality: N/A;   TEE WITHOUT CARDIOVERSION N/A 07/26/2013   Procedure: TRANSESOPHAGEAL ECHOCARDIOGRAM (TEE);  Surgeon: Paula V Ross, MD;  [facility];  Service: Cardiovascular;  Laterality: N/A;   TUBAL LIGATION  1973    Current Medications: Current Meds  Medication Sig   albuterol (PROVENTIL) (2.5 MG/3ML) 0.083% nebulizer solution Take 3 mLs (2.5 mg total) by nebulization every 4 (four) hours as needed for wheezing or shortness of breath. (Patient taking differently: Take 2.5 mg by nebulization 2 (two) times daily as needed for wheezing or shortness of breath.)   albuterol (VENTOLIN HFA) 108 (90 Base) MCG/ACT inhaler Inhale 1-2 puffs into the lungs every 6 (six) hours as needed for wheezing or shortness of breath.   atorvastatin (LIPITOR) 40 MG tablet TAKE ONE TABLET BY MOUTH ONCE DAILY. (Patient taking differently: Take 40 mg by mouth daily.)   budesonide (PULMICORT) 0.5 MG/2ML nebulizer solution USE 1/2 VIAL IN NEBULIZER TWICE DAILY. DISCARD UNUSED PORTION AFTER EACH USE. (Patient taking differently: Take 0.25 mg by nebulization 2 (two) times daily. USE 1/2 VIAL IN NEBULIZER TWICE DAILY. DISCARD UNUSED PORTION AFTER EACH USE.)   cetirizine (ZYRTEC) 10 MG tablet Take 10 mg by  mouth daily.   dextromethorphan-guaiFENesin (MUCINEX DM) 30-600 MG 12hr tablet Take 1 tablet by mouth 2 (two) times daily as needed for cough.   escitalopram (LEXAPRO) 10 MG tablet Take 10 mg by mouth daily.    famotidine (PEPCID) 20 MG tablet TAKE 1 TABLET AFTER SUPPER   fluticasone (FLONASE) 50 MCG/ACT nasal spray Place 1 spray into both nostrils daily.   gabapentin (NEURONTIN) 100 MG capsule TAKE 1 CAPSULE BY MOUTH FOUR TIMES DAILY. (Patient taking differently: Take 100 mg by mouth 4 (four) times daily.)   HYDROcodone-acetaminophen (NORCO/VICODIN) 5-325 MG tablet Take 1 tablet by mouth every 6 (six) hours as needed for moderate pain or severe pain.   levothyroxine (SYNTHROID) 100 MCG tablet Take 100 mcg by mouth daily before breakfast.   metFORMIN (GLUCOPHAGE) 500 MG tablet Take 1 tablet (500 mg total) by mouth 2 (two) times daily with a meal.   metoprolol tartrate (LOPRESSOR) 25 MG tablet Take 1 tablet (25 mg total) by mouth 2 (two) times daily. (Patient taking differently: Take 25 mg by mouth 2 (two) times daily. Take 1 tablet by mouth 2 times a day.May   take 1 extra tab as needed if HR greater than 100 BPM and SBP is greater than 120)   Multiple Vitamin (MULITIVITAMIN WITH MINERALS) TABS Take 1 tablet by mouth daily.   pantoprazole (PROTONIX) 40 MG tablet TAKE 1 TABLET 30 TO 60 MINUTES BEFORE FIRST MEAL OF THE DAY. (Patient taking differently: Take 40 mg by mouth daily before breakfast.)   predniSONE (DELTASONE) 10 MG tablet Prednisone dosing: Take  Prednisone 40mg (4 tabs) x 6 days, then taper to 30mg (3 tabs) x 3 days, then 20mg (2 tabs) x 3days, then 10mg (1 tab) x 3days, then OFF.   torsemide (DEMADEX) 20 MG tablet Take 1 tablet (20 mg total) by mouth daily.   warfarin (COUMADIN) 2.5 MG tablet TAKE AS DIRECTED BY COUMADIN CLINIC. (Patient taking differently: Take 2.5-3.75 mg by mouth See admin instructions. Take 2.5mg by mouth daily except on Sunday and Thursday. On Sunday and Thursday, take  3.75mg by mouth.)     Allergies:   Amiodarone   Social and Family History: Reviewed in Epic  ROS:   Please see the history of present illness.    All other systems reviewed and are negative.  EKGs/Labs/Other Studies Reviewed Today:    Echocardiogram:  TTE 11/12/2022  1. Left ventricular ejection fraction, by estimation, is 55 to 60%. The  left ventricle has normal function. The left ventricle has no regional  wall motion abnormalities. There is mild left ventricular hypertrophy.  Indeterminate diastolic filling due to  E-A fusion.   2. Right ventricular systolic function was not well visualized. The right  ventricular size is normal. There is moderately elevated pulmonary artery  systolic pressure. The estimated right ventricular systolic pressure is  54.3 mmHg.   3. Left atrial size was mildly dilated.   4. The mitral valve has been repaired/replaced. No evidence of mitral  valve regurgitation. No evidence of mitral stenosis. The mean mitral valve  gradient is 7.0 mmHg at HR of 105 bpm. There is a 25 mm Magna Ease  bioprosthetic valve present in the mitral  position. Procedure Date: 2014. Grossly normal function.   5. The aortic valve has been repaired/replaced. Aortic valve  regurgitation is not visualized. No aortic stenosis is present. There is a  21 mm Magna Ease valve present in the aortic position. Procedure Date:  2014. Aortic valve mean gradient measures 10.0  mmHg. Aortic valve Vmax measures 2.13 m/s. Grossly normal function.   6. The inferior vena cava is normal in size with greater than 50%  respiratory variability, suggesting right atrial pressure of 3 mmHg.   Monitors:  Zio Patch 11/2022 14 days Atrial flutter, 100% burden,  HR 55-150, avg 106 No patient-triggered events  Advanced imaging:   EKG:  Last EKG results: today -- atypical atrial flutter with 2:1 conduction, V-rate 116 bpm   Recent Labs: 11/11/2022: ALT 22; B Natriuretic Peptide 137.0; TSH  4.932 11/12/2022: Hemoglobin 12.9; Magnesium 1.7; Platelets 158 11/14/2022: BUN 18; Creatinine, Ser 1.09; Potassium 4.0; Sodium 134     Physical Exam:    VS:  BP 112/72   Pulse (!) 116   Ht 5' 2.5" (1.588 m)   Wt 162 lb 9.6 oz (73.8 kg)   SpO2 95%   BMI 29.27 kg/m     Wt Readings from Last 3 Encounters:  12/19/22 162 lb 9.6 oz (73.8 kg)  11/23/22 164 lb (74.4 kg)  11/14/22 166 lb 12.8 oz (75.7 kg)     GEN: Well nourished, well developed in no   acute distress CARDIAC: regular, tachy rhythm, no murmurs, rubs, gallops RESPIRATORY:  Normal work of breathing MUSCULOSKELETAL: trace edema    ASSESSMENT & PLAN:    Atrial flutter - longstanding persistent, likely atypical - it appears that she has been in atrial flutter for the past 3 years - atrial size is only mildly dilated - I think she deserves a shot at sinus rhythm since she is quite symptomatic -- DCCV - Increase metoprolol to 75 mg for rate control - Follow-up 2 to 3 weeks after cardioversion  Secondary hypercoagulable state - on warfarin for valvular AF  Syncope - situational syncope, triggered by shoulder pain - orthostatic during recent admission  Hypertension -Losartan discontinued due to hypotension, BP stable today        Medication Adjustments/Labs and Tests Ordered: Current medicines are reviewed at length with the patient today.  Concerns regarding medicines are outlined above.  No orders of the defined types were placed in this encounter.  No orders of the defined types were placed in this encounter.    Signed, Jaydn Fincher E Adabelle Griffiths, MD  12/19/2022 10:06 AM    [facility] 

## 2022-12-20 DIAGNOSIS — I110 Hypertensive heart disease with heart failure: Secondary | ICD-10-CM | POA: Diagnosis not present

## 2022-12-20 DIAGNOSIS — I5032 Chronic diastolic (congestive) heart failure: Secondary | ICD-10-CM | POA: Diagnosis not present

## 2022-12-20 DIAGNOSIS — M7521 Bicipital tendinitis, right shoulder: Secondary | ICD-10-CM | POA: Diagnosis not present

## 2022-12-20 DIAGNOSIS — M75111 Incomplete rotator cuff tear or rupture of right shoulder, not specified as traumatic: Secondary | ICD-10-CM | POA: Diagnosis not present

## 2022-12-20 DIAGNOSIS — M7581 Other shoulder lesions, right shoulder: Secondary | ICD-10-CM | POA: Diagnosis not present

## 2022-12-20 DIAGNOSIS — I6521 Occlusion and stenosis of right carotid artery: Secondary | ICD-10-CM | POA: Diagnosis not present

## 2022-12-20 DIAGNOSIS — M19011 Primary osteoarthritis, right shoulder: Secondary | ICD-10-CM | POA: Diagnosis not present

## 2022-12-20 DIAGNOSIS — G8929 Other chronic pain: Secondary | ICD-10-CM | POA: Diagnosis not present

## 2022-12-20 DIAGNOSIS — I4892 Unspecified atrial flutter: Secondary | ICD-10-CM | POA: Diagnosis not present

## 2022-12-21 ENCOUNTER — Ambulatory Visit: Payer: Medicare PPO | Attending: Cardiology

## 2022-12-21 ENCOUNTER — Other Ambulatory Visit: Payer: Self-pay | Admitting: Cardiology

## 2022-12-21 DIAGNOSIS — Z952 Presence of prosthetic heart valve: Secondary | ICD-10-CM | POA: Diagnosis not present

## 2022-12-21 DIAGNOSIS — Z5181 Encounter for therapeutic drug level monitoring: Secondary | ICD-10-CM

## 2022-12-21 DIAGNOSIS — I639 Cerebral infarction, unspecified: Secondary | ICD-10-CM

## 2022-12-21 DIAGNOSIS — I4891 Unspecified atrial fibrillation: Secondary | ICD-10-CM

## 2022-12-21 LAB — POCT INR: INR: 5.7 — AB (ref 2.0–3.0)

## 2022-12-21 NOTE — Patient Instructions (Signed)
Description   Skip today and tomorrow's dosage of Warfarin, then start taking 1 tablet daily except for 1.5 tablets on Sundays. Recheck INR in 1 week pending cardioversion on 01/04/23.

## 2022-12-26 ENCOUNTER — Encounter: Payer: Self-pay | Admitting: Cardiovascular Disease

## 2022-12-28 ENCOUNTER — Ambulatory Visit: Payer: Medicare PPO | Attending: Cardiology | Admitting: *Deleted

## 2022-12-28 DIAGNOSIS — Z5181 Encounter for therapeutic drug level monitoring: Secondary | ICD-10-CM | POA: Diagnosis not present

## 2022-12-28 DIAGNOSIS — Z952 Presence of prosthetic heart valve: Secondary | ICD-10-CM

## 2022-12-28 LAB — POCT INR: INR: 3.1 — AB (ref 2.0–3.0)

## 2022-12-28 NOTE — Patient Instructions (Signed)
Continue warfarin 1 tablet daily except for 1.5 tablets on Sundays. Recheck INR in 1 week pending cardioversion on 01/04/23.

## 2022-12-30 ENCOUNTER — Telehealth: Payer: Self-pay | Admitting: Cardiology

## 2022-12-30 DIAGNOSIS — G8929 Other chronic pain: Secondary | ICD-10-CM | POA: Diagnosis not present

## 2022-12-30 DIAGNOSIS — M19011 Primary osteoarthritis, right shoulder: Secondary | ICD-10-CM | POA: Diagnosis not present

## 2022-12-30 DIAGNOSIS — M75111 Incomplete rotator cuff tear or rupture of right shoulder, not specified as traumatic: Secondary | ICD-10-CM | POA: Diagnosis not present

## 2022-12-30 DIAGNOSIS — I5032 Chronic diastolic (congestive) heart failure: Secondary | ICD-10-CM | POA: Diagnosis not present

## 2022-12-30 DIAGNOSIS — I4892 Unspecified atrial flutter: Secondary | ICD-10-CM | POA: Diagnosis not present

## 2022-12-30 DIAGNOSIS — I110 Hypertensive heart disease with heart failure: Secondary | ICD-10-CM | POA: Diagnosis not present

## 2022-12-30 DIAGNOSIS — M7581 Other shoulder lesions, right shoulder: Secondary | ICD-10-CM | POA: Diagnosis not present

## 2022-12-30 DIAGNOSIS — I6521 Occlusion and stenosis of right carotid artery: Secondary | ICD-10-CM | POA: Diagnosis not present

## 2022-12-30 DIAGNOSIS — M7521 Bicipital tendinitis, right shoulder: Secondary | ICD-10-CM | POA: Diagnosis not present

## 2022-12-30 NOTE — Telephone Encounter (Signed)
Spoke with patient's husband Herbie Baltimore (OK per [facility]) regarding message from Dr. Ria Comment office.  Herbie Baltimore states these symptoms are not new, they have been going on for the past 3-4 weeks. Patient is scheduled for a cardioversion on 01/04/23. Doreatha Massed that if patient's symptoms worsen before she is able to go in for scheduled DCCV to take her to the hospital. Herbie Baltimore verbalized understanding and states she should be able to proceed as scheduled.  Left message for Bubba Hales at Dr. Ria Comment office informing her we have received her message and patient is currently scheduled for a cardioversion on 01/04/23 as recommended by Dr. Myles Gip. Provided office number if any questions.

## 2022-12-30 NOTE — Telephone Encounter (Signed)
Will fwd to EP staff - looks like message for similar addressed on 12/26/2022 - pending DCCV for 01/04/2023.

## 2022-12-30 NOTE — Telephone Encounter (Signed)
Pt c/o medication issue:  1. Name of Medication:   metoprolol tartrate (LOPRESSOR) 25 MG tablet   2. How are you currently taking this medication (dosage and times per day)?   As prescribed  3. Are you having a reaction (difficulty breathing--STAT)?  No  4. What is your medication issue?   Caller states patient's medication was increased a couple of weeks ago and patient's husband is reporting patient has been unsteady on her feet and reports feeling weak.  Caller stated husband reports patient's HR has not fallen below 110 at rest.  Caller stated patient reported having shortness of breath just walking around her house.

## 2023-01-02 DIAGNOSIS — I4891 Unspecified atrial fibrillation: Secondary | ICD-10-CM | POA: Diagnosis not present

## 2023-01-02 DIAGNOSIS — Z01812 Encounter for preprocedural laboratory examination: Secondary | ICD-10-CM | POA: Diagnosis not present

## 2023-01-03 ENCOUNTER — Other Ambulatory Visit: Payer: Self-pay | Admitting: Internal Medicine

## 2023-01-03 ENCOUNTER — Ambulatory Visit: Payer: Medicare PPO | Attending: Cardiology | Admitting: *Deleted

## 2023-01-03 DIAGNOSIS — Z5181 Encounter for therapeutic drug level monitoring: Secondary | ICD-10-CM

## 2023-01-03 DIAGNOSIS — Z952 Presence of prosthetic heart valve: Secondary | ICD-10-CM

## 2023-01-03 DIAGNOSIS — R0609 Other forms of dyspnea: Secondary | ICD-10-CM

## 2023-01-03 LAB — BASIC METABOLIC PANEL
BUN/Creatinine Ratio: 13 (ref 12–28)
BUN: 15 mg/dL (ref 8–27)
CO2: 23 mmol/L (ref 20–29)
Calcium: 8.9 mg/dL (ref 8.7–10.3)
Chloride: 93 mmol/L — ABNORMAL LOW (ref 96–106)
Creatinine, Ser: 1.2 mg/dL — ABNORMAL HIGH (ref 0.57–1.00)
Glucose: 155 mg/dL — ABNORMAL HIGH (ref 70–99)
Potassium: 3.8 mmol/L (ref 3.5–5.2)
Sodium: 136 mmol/L (ref 134–144)
eGFR: 46 mL/min/{1.73_m2} — ABNORMAL LOW (ref >59)

## 2023-01-03 LAB — CBC
Hematocrit: 40.6 % (ref 34.0–46.6)
Hemoglobin: 13.7 g/dL (ref 11.1–15.9)
MCH: 31.1 pg (ref 26.6–33.0)
MCHC: 33.7 g/dL (ref 31.5–35.7)
MCV: 92 fL (ref 79–97)
Platelets: 198 10*3/uL (ref 150–450)
RBC: 4.41 x10E6/uL (ref 3.77–5.28)
RDW: 14.3 % (ref 11.7–15.4)
WBC: 9 10*3/uL (ref 3.4–10.8)

## 2023-01-03 LAB — PROTIME-INR
INR: 4.4 — ABNORMAL HIGH (ref 0.9–1.2)
Prothrombin Time: 43 s — ABNORMAL HIGH (ref 9.1–12.0)

## 2023-01-03 LAB — POCT INR: INR: 4.6 — AB (ref 2.0–3.0)

## 2023-01-03 NOTE — Patient Instructions (Signed)
Pending cardioversion on 01/04/23. Hold warfarin tonight then decrease dose to 1 tablet daily Recheck INR in 1 week pending cardioversion on 01/04/23.

## 2023-01-04 ENCOUNTER — Encounter (HOSPITAL_COMMUNITY): Payer: Self-pay | Admitting: Cardiology

## 2023-01-04 ENCOUNTER — Ambulatory Visit (HOSPITAL_COMMUNITY): Payer: Medicare PPO | Admitting: Certified Registered Nurse Anesthetist

## 2023-01-04 ENCOUNTER — Ambulatory Visit (HOSPITAL_COMMUNITY)
Admission: RE | Admit: 2023-01-04 | Discharge: 2023-01-04 | Disposition: A | Discharge status: Home / Self Care | Payer: Medicare PPO | Attending: Cardiology | Admitting: Cardiology

## 2023-01-04 ENCOUNTER — Other Ambulatory Visit: Payer: Self-pay

## 2023-01-04 ENCOUNTER — Encounter (HOSPITAL_COMMUNITY)
Admission: RE | Disposition: A | Discharge status: Home / Self Care | Payer: Self-pay | Source: Home / Self Care | Attending: Cardiology

## 2023-01-04 ENCOUNTER — Ambulatory Visit (HOSPITAL_BASED_OUTPATIENT_CLINIC_OR_DEPARTMENT_OTHER): Payer: Medicare PPO | Admitting: Certified Registered Nurse Anesthetist

## 2023-01-04 DIAGNOSIS — I110 Hypertensive heart disease with heart failure: Secondary | ICD-10-CM | POA: Diagnosis not present

## 2023-01-04 DIAGNOSIS — K3184 Gastroparesis: Secondary | ICD-10-CM | POA: Insufficient documentation

## 2023-01-04 DIAGNOSIS — I501 Left ventricular failure: Secondary | ICD-10-CM | POA: Diagnosis not present

## 2023-01-04 DIAGNOSIS — E1143 Type 2 diabetes mellitus with diabetic autonomic (poly)neuropathy: Secondary | ICD-10-CM | POA: Diagnosis not present

## 2023-01-04 DIAGNOSIS — I130 Hypertensive heart and chronic kidney disease with heart failure and stage 1 through stage 4 chronic kidney disease, or unspecified chronic kidney disease: Secondary | ICD-10-CM | POA: Insufficient documentation

## 2023-01-04 DIAGNOSIS — E1122 Type 2 diabetes mellitus with diabetic chronic kidney disease: Secondary | ICD-10-CM | POA: Diagnosis not present

## 2023-01-04 DIAGNOSIS — Z952 Presence of prosthetic heart valve: Secondary | ICD-10-CM | POA: Diagnosis not present

## 2023-01-04 DIAGNOSIS — I4891 Unspecified atrial fibrillation: Secondary | ICD-10-CM | POA: Insufficient documentation

## 2023-01-04 DIAGNOSIS — Z8673 Personal history of transient ischemic attack (TIA), and cerebral infarction without residual deficits: Secondary | ICD-10-CM | POA: Diagnosis not present

## 2023-01-04 DIAGNOSIS — J449 Chronic obstructive pulmonary disease, unspecified: Secondary | ICD-10-CM | POA: Insufficient documentation

## 2023-01-04 DIAGNOSIS — N182 Chronic kidney disease, stage 2 (mild): Secondary | ICD-10-CM | POA: Insufficient documentation

## 2023-01-04 DIAGNOSIS — I099 Rheumatic heart disease, unspecified: Secondary | ICD-10-CM | POA: Diagnosis not present

## 2023-01-04 DIAGNOSIS — Z7951 Long term (current) use of inhaled steroids: Secondary | ICD-10-CM | POA: Diagnosis not present

## 2023-01-04 DIAGNOSIS — Z7901 Long term (current) use of anticoagulants: Secondary | ICD-10-CM | POA: Insufficient documentation

## 2023-01-04 DIAGNOSIS — K219 Gastro-esophageal reflux disease without esophagitis: Secondary | ICD-10-CM | POA: Insufficient documentation

## 2023-01-04 DIAGNOSIS — Z7984 Long term (current) use of oral hypoglycemic drugs: Secondary | ICD-10-CM | POA: Diagnosis not present

## 2023-01-04 DIAGNOSIS — E119 Type 2 diabetes mellitus without complications: Secondary | ICD-10-CM | POA: Diagnosis not present

## 2023-01-04 DIAGNOSIS — F32A Depression, unspecified: Secondary | ICD-10-CM | POA: Diagnosis not present

## 2023-01-04 DIAGNOSIS — I4892 Unspecified atrial flutter: Secondary | ICD-10-CM | POA: Diagnosis not present

## 2023-01-04 DIAGNOSIS — E039 Hypothyroidism, unspecified: Secondary | ICD-10-CM | POA: Diagnosis not present

## 2023-01-04 DIAGNOSIS — Z79899 Other long term (current) drug therapy: Secondary | ICD-10-CM | POA: Insufficient documentation

## 2023-01-04 DIAGNOSIS — I484 Atypical atrial flutter: Secondary | ICD-10-CM | POA: Diagnosis not present

## 2023-01-04 DIAGNOSIS — I509 Heart failure, unspecified: Secondary | ICD-10-CM | POA: Diagnosis not present

## 2023-01-04 DIAGNOSIS — Z09 Encounter for follow-up examination after completed treatment for conditions other than malignant neoplasm: Secondary | ICD-10-CM | POA: Diagnosis not present

## 2023-01-04 HISTORY — PX: CARDIOVERSION: SHX1299

## 2023-01-04 SURGERY — CARDIOVERSION
Anesthesia: Monitor Anesthesia Care

## 2023-01-04 MED ORDER — SODIUM CHLORIDE 0.9 % IV SOLN: INTRAVENOUS | Status: DC

## 2023-01-04 MED ORDER — PROPOFOL 10 MG/ML IV BOLUS
INTRAVENOUS | Status: DC | PRN
  Administered 2023-01-04: 70 mg via INTRAVENOUS

## 2023-01-04 MED ORDER — LIDOCAINE 2% (20 MG/ML) 5 ML SYRINGE
INTRAMUSCULAR | Status: DC | PRN
  Administered 2023-01-04: 60 mg via INTRAVENOUS

## 2023-01-04 NOTE — Anesthesia Procedure Notes (Signed)
Procedure Name: General with mask airway Date/Time: 01/04/2023 1:08 PM  Performed by: Everlean Cherry A, CRNAPre-anesthesia Checklist: Patient identified, Emergency Drugs available, Suction available and Patient being monitored Patient Re-evaluated:Patient Re-evaluated prior to induction Oxygen Delivery Method: Ambu bag Preoxygenation: Pre-oxygenation with 100% oxygen Induction Type: IV induction Ventilation: Mask ventilation without difficulty Placement Confirmation: positive ETCO2 Dental Injury: Teeth and Oropharynx as per pre-operative assessment

## 2023-01-04 NOTE — CV Procedure (Signed)
Procedure: Electrical Cardioversion Indications: Atrial flutter  Procedure Details:  Consent: Risks of procedure as well as the alternatives and risks of each were explained to the (patient/caregiver).  Consent for procedure obtained.  Time Out: Verified patient identification, verified procedure, site/side was marked, verified correct patient position, special equipment/implants available, medications/allergies/relevent history reviewed, required imaging and test results available. PERFORMED.  Patient placed on cardiac monitor, pulse oximetry, supplemental oxygen as necessary.  Sedation given:  Lidocaine 60mg ; propofol 70mg  Pacer pads placed anterior and posterior chest.  Cardioverted 1 time(s).  Cardioversion with synchronized biphasic 150J shock.  Evaluation: Findings: Post procedure EKG shows: NSR Complications: None Patient did tolerate procedure well.  Time Spent Directly with the Patient:  38minutes   [name] 01/04/2023, 1:12 PM

## 2023-01-04 NOTE — Transfer of Care (Signed)
Immediate Anesthesia Transfer of Care Note  Patient: [name]  Procedure(s) Performed: CARDIOVERSION  Patient Location: Endoscopy Unit  Anesthesia Type:General  Level of Consciousness: drowsy  Airway & Oxygen Therapy: Patient Spontanous Breathing  Post-op Assessment: Report given to RN and Post -op Vital signs reviewed and stable  Post vital signs: Reviewed and stable  Last Vitals:  Vitals Value Taken Time  BP 110/61 01/04/23 1314  Temp    Pulse 75 01/04/23 1314  Resp 14 01/04/23 1314  SpO2 94 01/04/23 1314    Last Pain:  Vitals:   01/04/23 1054  TempSrc: Temporal  PainSc: 0-No pain         Complications: No notable events documented.

## 2023-01-04 NOTE — Interval H&P Note (Signed)
History and Physical Interval Note:  01/04/2023 11:22 AM  [name]  has presented today for surgery, with the diagnosis of ATRIAL FLUTTER.  The various methods of treatment have been discussed with the patient and family. After consideration of risks, benefits and other options for treatment, the patient has consented to  Procedure(s): CARDIOVERSION (N/A) as a surgical intervention.  The patient's history has been reviewed, patient examined, no change in status, stable for surgery.  I have reviewed the patient's chart and labs.  Questions were answered to the patient's satisfaction.     [name]

## 2023-01-05 NOTE — Anesthesia Postprocedure Evaluation (Signed)
Anesthesia Post Note  Patient: [name]  Procedure(s) Performed: CARDIOVERSION     Patient location during evaluation: PACU Anesthesia Type: General Level of consciousness: awake and alert Pain management: pain level controlled Vital Signs Assessment: post-procedure vital signs reviewed and stable Respiratory status: spontaneous breathing, nonlabored ventilation, respiratory function stable and patient connected to nasal cannula oxygen Cardiovascular status: blood pressure returned to baseline and stable Postop Assessment: no apparent nausea or vomiting Anesthetic complications: no   No notable events documented.  Last Vitals:  Vitals:   01/04/23 1325 01/04/23 1335  BP: 117/62 (!) 112/58  Pulse: 75 74  Resp: 14 14  Temp:    SpO2: 97% 97%    Last Pain:  Vitals:   01/04/23 1335  TempSrc:   PainSc: 0-No pain                 Belenda Cruise P Pavlos Yon

## 2023-01-05 NOTE — Anesthesia Preprocedure Evaluation (Signed)
Anesthesia Evaluation  Patient identified by MRN, date of birth, ID band Patient awake    Reviewed: Allergy & Precautions, NPO status , Patient's Chart, lab work & pertinent test results  Airway Mallampati: II       Dental no notable dental hx.    Pulmonary asthma , COPD,  COPD inhaler   Pulmonary exam normal        Cardiovascular hypertension, Pt. on medications and Pt. on home beta blockers +CHF  + dysrhythmias Atrial Fibrillation  Rhythm:Irregular Rate:Normal     Neuro/Psych    Depression    CVA    GI/Hepatic Neg liver ROS,GERD  Medicated,,  Endo/Other  diabetes, Type 2, Oral Hypoglycemic AgentsHypothyroidism    Renal/GU   negative genitourinary   Musculoskeletal negative musculoskeletal ROS (+)    Abdominal Normal abdominal exam  (+)   Peds  Hematology  (+) Blood dyscrasia, anemia   Anesthesia Other Findings   Reproductive/Obstetrics                             Anesthesia Physical Anesthesia Plan  ASA: 3  Anesthesia Plan: General   Post-op Pain Management:    Induction: Intravenous  PONV Risk Score and Plan: 3 and Propofol infusion and Treatment may vary due to age or medical condition  Airway Management Planned: Mask  Additional Equipment: None  Intra-op Plan:   Post-operative Plan:   Informed Consent: I have reviewed the patients History and Physical, chart, labs and discussed the procedure including the risks, benefits and alternatives for the proposed anesthesia with the patient or authorized representative who has indicated his/her understanding and acceptance.     Dental advisory given  Plan Discussed with:   Anesthesia Plan Comments:        Anesthesia Quick Evaluation

## 2023-01-06 ENCOUNTER — Encounter (HOSPITAL_COMMUNITY): Payer: Self-pay | Admitting: Cardiology

## 2023-01-09 ENCOUNTER — Ambulatory Visit: Payer: Medicare PPO | Attending: Nurse Practitioner | Admitting: Nurse Practitioner

## 2023-01-09 ENCOUNTER — Encounter: Payer: Self-pay | Admitting: Nurse Practitioner

## 2023-01-09 VITALS — BP 118/72 | HR 75 | Ht 62.0 in | Wt 161.4 lb

## 2023-01-09 DIAGNOSIS — N1831 Chronic kidney disease, stage 3a: Secondary | ICD-10-CM | POA: Diagnosis not present

## 2023-01-09 DIAGNOSIS — E785 Hyperlipidemia, unspecified: Secondary | ICD-10-CM

## 2023-01-09 DIAGNOSIS — Z8673 Personal history of transient ischemic attack (TIA), and cerebral infarction without residual deficits: Secondary | ICD-10-CM | POA: Diagnosis not present

## 2023-01-09 DIAGNOSIS — I10 Essential (primary) hypertension: Secondary | ICD-10-CM

## 2023-01-09 DIAGNOSIS — C50919 Malignant neoplasm of unspecified site of unspecified female breast: Secondary | ICD-10-CM | POA: Diagnosis not present

## 2023-01-09 DIAGNOSIS — I5032 Chronic diastolic (congestive) heart failure: Secondary | ICD-10-CM

## 2023-01-09 DIAGNOSIS — I482 Chronic atrial fibrillation, unspecified: Secondary | ICD-10-CM | POA: Diagnosis not present

## 2023-01-09 DIAGNOSIS — I6521 Occlusion and stenosis of right carotid artery: Secondary | ICD-10-CM

## 2023-01-09 DIAGNOSIS — I6300 Cerebral infarction due to thrombosis of unspecified precerebral artery: Secondary | ICD-10-CM | POA: Diagnosis not present

## 2023-01-09 DIAGNOSIS — Z79899 Other long term (current) drug therapy: Secondary | ICD-10-CM | POA: Diagnosis not present

## 2023-01-09 DIAGNOSIS — I38 Endocarditis, valve unspecified: Secondary | ICD-10-CM | POA: Diagnosis not present

## 2023-01-09 DIAGNOSIS — I484 Atypical atrial flutter: Secondary | ICD-10-CM

## 2023-01-09 DIAGNOSIS — Z87898 Personal history of other specified conditions: Secondary | ICD-10-CM | POA: Diagnosis not present

## 2023-01-09 DIAGNOSIS — I4891 Unspecified atrial fibrillation: Secondary | ICD-10-CM

## 2023-01-09 DIAGNOSIS — E039 Hypothyroidism, unspecified: Secondary | ICD-10-CM | POA: Diagnosis not present

## 2023-01-09 DIAGNOSIS — E1129 Type 2 diabetes mellitus with other diabetic kidney complication: Secondary | ICD-10-CM | POA: Diagnosis not present

## 2023-01-09 NOTE — Progress Notes (Addendum)
Cardiology Office Note:    Date:  01/09/2023  ID:  [name], DOB 1945/12/23, MRN [id number]  PCP:  Carylon Perches, MD   [facility] Providers Cardiologist:  Dina Rich, MD Electrophysiologist:  Maurice Small, MD     Referring MD: Carylon Perches, MD   CC: Here for follow-up  History of Present Illness:    [name] is a 77 y.o. female with a hx of the following:  A-fib Valvular heart disease, s/p MVR and AVR Chronic diastolic CHF Shortness of breath Hypertension Hyperlipidemia Carotid artery stenosis History of CVA  Patient is a 77 year old female with past medical history as mentioned above.  Underwent mitral valve replacement with 25 mm Edwards Magna-ease pericardial valve and also aortic valve replacement with 21 mm Edwards Magna-ease pericardial valve in 2014.  Admitted 03/2022 with hypoxia and shortness of breath.  TTE  revealed EF 60%, normal MVR and AVR. CXR revealed R base atelectasis/developing infection, loculated pleural fluid and right lateral lung slightly increased from prior, with possible component of CHF.  Seen by Dr. Dina Rich on October 10, 2022. Noted some ongoing issues with intermittent fluid volume overload, hypoxia at home. Symptoms improved since diuretic change.  Torsemide was changed to 40 mg in the morning and 20 mg in the evening, and may take an extra 20 mg daily as needed for swelling or shortness of breath.  Chest x-ray ordered.    Admitted 11/2022 due to syncope with A-flutter and bradycardia.  Noted to have 2 syncopal episodes on the morning of admission, both were witnessed. Poor appetite prior to event.  EKG showed a flutter with 4:1 block cardiology was consulted.  CT of the head and neck was negative.  Chest x-ray and x-ray of shoulder showed nothing acute. Transferred to [facility] for further workup and EP evaluation.  Cardiology recommended to hold AV nodal agents continue Coumadin. TTE report below. Carotid Doppler showed  mild 1-49% stenosis of proximal right ICA, no significant stenosis in left ICA.  EP recommended checking orthostatics and resuming beta-blocker.  Was started on metoprolol tartrate 25 mg twice daily, cardiology recommended to hold diltiazem and ARB, was told to resume torsemide 20 mg daily at discharge.  14-day Zio patch was arranged and report noted below.   Today she presents for outpatient follow-up with her husband. Doing well since her cardioversion.  She says she is back to increasing her physical activity.  She has resumed walking and cooking.  Tolerating her medications well. Denies any chest pain, shortness of breath, palpitations, syncope, presyncope, dizziness, orthopnea, PND,  significant weight changes, acute bleeding, or claudication.  Past Medical History:  Diagnosis Date   Anemia    Asthma    Atrial fibrillation    Breast carcinoma 1995   1995   Cardioembolic stroke 01/2012   Right frontal in 01/2012; normal carotid ultrasound; possible LAA thrombus by TEE; virtual complete neurologic recovery   Chronic kidney disease, stage 2, mildly decreased GFR    GFR of approximately 60   Depression    Diabetes mellitus without complication    Diverticulosis of colon (without mention of hemorrhage) 2012   Dr. Karilyn Cota   Fasting hyperglycemia    120 fasting   Gastroesophageal reflux disease    Gastroparesis    Hemorrhoids    Hyperlipidemia    Hypertension    pt denies 05/30/13     Dr Hinton Rao   Hyponatremia    Rheumatic heart disease  a. 07/2013 Echo: Ef 55-60%, Mod AS/AI, Mod-Sev MS, sev dil LA, PASP 58;  b. 07/2013 TEE: EF 35-40%, mild-mod AS/AI, mod-sev MS, LA smoke;  c. 07/2013 Cath: elev R heart pressures, nl cors.   Shortness of breath     Past Surgical History:  Procedure Laterality Date   AORTIC VALVE REPLACEMENT N/A 07/18/2013   Procedure: AORTIC VALVE REPLACEMENT (AVR);  Surgeon: [name], MD;  Location: [facility] OR;  Service: Open Heart Surgery;  Laterality:  N/A;   BACK SURGERY     BREAST LUMPECTOMY Right 1995   CARDIAC CATHETERIZATION     CARDIOVERSION N/A 07/26/2013   Procedure: CARDIOVERSION;  Surgeon: Pricilla Riffle, MD;  Location: [facility] ENDOSCOPY;  Service: Cardiovascular;  Laterality: N/A;   CARDIOVERSION N/A 01/04/2023   Procedure: CARDIOVERSION;  Surgeon: Meriam Sprague, MD;  Location: [facility] ENDOSCOPY;  Service: Cardiovascular;  Laterality: N/A;   COLONOSCOPY  2012   Negative screening procedure   DILATION AND CURETTAGE OF UTERUS     ESOPHAGEAL MANOMETRY N/A 06/17/2013   Procedure: ESOPHAGEAL MANOMETRY (EM);  Surgeon: Mardella Layman, MD;  Location: [facility] ENDOSCOPY;  Service: Endoscopy;  Laterality: N/A;   INTRAOPERATIVE TRANSESOPHAGEAL ECHOCARDIOGRAM N/A 07/18/2013   Procedure: INTRAOPERATIVE TRANSESOPHAGEAL ECHOCARDIOGRAM;  Surgeon: [name], MD;  Location: [facility] OR;  Service: Open Heart Surgery;  Laterality: N/A;   LEFT AND RIGHT HEART CATHETERIZATION WITH CORONARY ANGIOGRAM N/A 07/10/2013   Procedure: LEFT AND RIGHT HEART CATHETERIZATION WITH CORONARY ANGIOGRAM;  Surgeon: Kathleene Hazel, MD;  Location: [facility] CATH LAB;  Service: Cardiovascular;  Laterality: N/A;   LUMBAR LAMINECTOMY/DECOMPRESSION MICRODISCECTOMY Right 05/13/2014   Procedure: LUMBAR LAMINECTOMY/DECOMPRESSION MICRODISCECTOMY 1 LEVEL  lumbar four/five;  Surgeon: Reinaldo Meeker, MD;  Location: [facility] NEURO ORS;  Service: Neurosurgery;  Laterality: Right;   MITRAL VALVE REPLACEMENT N/A 07/18/2013   Procedure: MITRAL VALVE (MV) REPLACEMENT;  Surgeon: [name], MD;  Location: [facility] OR;  Service: Open Heart Surgery;  Laterality: N/A;   MITRAL VALVE SURGERY  [facility], closed mitral valvulotomy by finger fracture   TEE WITHOUT CARDIOVERSION  01/24/2012   Procedure: TRANSESOPHAGEAL ECHOCARDIOGRAM (TEE);  Surgeon: Lewayne Bunting, MD;  Location: [facility] ENDOSCOPY;  Service: Cardiovascular;  Laterality: N/A;   TEE WITHOUT CARDIOVERSION N/A 07/11/2013   Procedure: TRANSESOPHAGEAL  ECHOCARDIOGRAM (TEE);  Surgeon: Vesta Mixer, MD;  Location: [facility] ENDOSCOPY;  Service: Cardiovascular;  Laterality: N/A;   TEE WITHOUT CARDIOVERSION N/A 07/26/2013   Procedure: TRANSESOPHAGEAL ECHOCARDIOGRAM (TEE);  Surgeon: Pricilla Riffle, MD;  Location: [facility] ENDOSCOPY;  Service: Cardiovascular;  Laterality: N/A;   TUBAL LIGATION  1973   Current Meds  Medication Sig   albuterol (PROVENTIL) (2.5 MG/3ML) 0.083% nebulizer solution Take 3 mLs (2.5 mg total) by nebulization every 4 (four) hours as needed for wheezing or shortness of breath. (Patient taking differently: Take 2.5 mg by nebulization 2 (two) times daily as needed for wheezing or shortness of breath.)   albuterol (VENTOLIN HFA) 108 (90 Base) MCG/ACT inhaler Inhale 1-2 puffs into the lungs every 6 (six) hours as needed for wheezing or shortness of breath.   atorvastatin (LIPITOR) 40 MG tablet TAKE ONE TABLET BY MOUTH ONCE DAILY.   budesonide (PULMICORT) 0.5 MG/2ML nebulizer solution USE 1/2 VIAL IN NEBULIZER TWICE DAILY. DISCARD UNUSED PORTION AFTER EACH USE. (Patient taking differently: Take 0.25 mg by nebulization 2 (two) times daily. USE 1/2 VIAL IN NEBULIZER TWICE DAILY. DISCARD UNUSED PORTION AFTER EACH USE.)   dextromethorphan-guaiFENesin (MUCINEX DM) 30-600 MG 12hr  tablet Take 1 tablet by mouth 2 (two) times daily as needed for cough.   escitalopram (LEXAPRO) 10 MG tablet Take 10 mg by mouth daily with breakfast.   famotidine (PEPCID) 20 MG tablet TAKE 1 TABLET AFTER SUPPER   fexofenadine (ALLEGRA) 180 MG tablet Take 180 mg by mouth in the morning.   fluticasone (FLONASE) 50 MCG/ACT nasal spray Place 1 spray into both nostrils daily.   gabapentin (NEURONTIN) 100 MG capsule TAKE 1 CAPSULE BY MOUTH FOUR TIMES DAILY.   levothyroxine (SYNTHROID) 100 MCG tablet Take 100 mcg by mouth daily before breakfast.   metFORMIN (GLUCOPHAGE) 500 MG tablet Take 1 tablet (500 mg total) by mouth 2 (two) times daily with a meal.   metoprolol tartrate  (LOPRESSOR) 25 MG tablet Take 3 tablets (75 mg total) by mouth 2 (two) times daily.   Multiple Vitamin (MULITIVITAMIN WITH MINERALS) TABS Take 1 tablet by mouth daily.   pantoprazole (PROTONIX) 40 MG tablet TAKE 1 TABLET 30 TO 60 MINUTES BEFORE FIRST MEAL OF THE DAY. (Patient taking differently: Take 40 mg by mouth daily before breakfast.)   Polyethyl Glycol-Propyl Glycol (LUBRICANT EYE DROPS) 0.4-0.3 % SOLN Place 1-2 drops into both eyes 3 (three) times daily as needed (dry/irritated eyes.).   potassium chloride SA (KLOR-CON M) 20 MEQ tablet Take 1 tablet (20 mEq total) by mouth daily.   torsemide (DEMADEX) 20 MG tablet Take 1 tablet (20 mg total) by mouth daily.   warfarin (COUMADIN) 2.5 MG tablet TAKE ONE TO 1.5 TABLETS BY MOUTH ONCE DAILY AS DIRECTED BY COUMADIN CLINIC. (Patient taking differently: Take 2.5-3.75 mg by mouth See admin instructions. Take 1.5 tablets (3.75 mg) by mouth twice weekly on Sundays & Thursdays in the evening. 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    Employer: RETIRED  Tobacco Use   Smoking status: Never   Smokeless tobacco: Never  Vaping Use   Vaping Use: Never used  Substance and Sexual Activity   Alcohol use: No    Alcohol/week: 0.0 standard drinks of alcohol   Drug use: No   Sexual activity: Not Currently  Other Topics Concern   Not on file  Social History Narrative   Married with children   No regular exercise   Social Determinants of Health   Financial Resource Strain: Not on file  Food Insecurity: No Food Insecurity (11/11/2022)   Hunger Vital Sign    Worried About Running Out of Food in the Last Year: Never true     Ran Out of Food in the Last Year: Never true  Transportation Needs: No Transportation Needs (11/11/2022)   PRAPARE - Administrator, Civil ServiceTransportation    Lack of Transportation (Medical): No    Lack of Transportation (Non-Medical): No  Physical Activity: Not on file  Stress: Not on file  Social Connections: Not on file     Family History: The patient's family history includes Asthma in her maternal grandfather; Cirrhosis in her father; Diabetes in her sister; Heart disease (age of onset: 3250) in her brother; Hypothyroidism in her mother; Rheum arthritis in her maternal grandmother.  ROS:   Please see  the history of present illness.     All other systems reviewed and are negative.  EKGs/Labs/Other Studies Reviewed:    The following studies were reviewed today:   EKG:  EKG is not ordered today.   Cardiac monitor 12/2022:   14 day monitor   Atrial flutter throughout the study, min rate 55, max rate 155, avg rate 106   Rare ventricular ectopy in the form of isolated PVCs, couplets, triplets   No symptoms reported     Patch Wear Time:  13 days and 18 hours (2024-02-12T11:46:18-0500 to 2024-02-26T05:53:24-0500)   Atrial Flutter occurred continuously (100% burden), ranging from 55-150 bpm (avg of 106 bpm). Atrial Flutter may be possible Atrial Tachycardia with variable block. Isolated VEs were rare (<1.0%, 11219), VE Couplets were rare (<1.0%, 696), and VE  Triplets were rare (<1.0%, 2). Ventricular Trigeminy was present. Previously notified: MD notification criteria for First Documentation of Atrial Flutter met - Notified Dr. Lisabeth Devoid on 14 Nov 2022 at 5:57 PM EST ([place]).    Echocardiogram on 11/12/2022:  1. Left ventricular ejection fraction, by estimation, is 55 to 60%. The  left ventricle has normal function. The left ventricle has no regional  wall motion abnormalities. There is mild left ventricular hypertrophy.  Indeterminate diastolic filling due to  E-A fusion.   2. Right ventricular systolic  function was not well visualized. The right  ventricular size is normal. There is moderately elevated pulmonary artery  systolic pressure. The estimated right ventricular systolic pressure is  54.3 mmHg.   3. Left atrial size was mildly dilated.   4. The mitral valve has been repaired/replaced. No evidence of mitral  valve regurgitation. No evidence of mitral stenosis. The mean mitral valve  gradient is 7.0 mmHg at HR of 105 bpm. There is a 25 mm Magna Ease  bioprosthetic valve present in the mitral   position. Procedure Date: 2014. Grossly normal function.   5. The aortic valve has been repaired/replaced. Aortic valve  regurgitation is not visualized. No aortic stenosis is present. There is a  21 mm Magna Ease valve present in the aortic position. Procedure Date:  2014. Aortic valve mean gradient measures 10.0   mmHg. Aortic valve Vmax measures 2.13 m/s. Grossly normal function.   6. The inferior vena cava is normal in size with greater than 50%  respiratory variability, suggesting right atrial pressure of 3 mmHg.   Comparison(s): No significant change from prior study.  Carotid duplex on 11/11/2022: IMPRESSION: 1. Mild (1-49%) stenosis proximal right internal carotid artery secondary to trace heterogeneous atherosclerotic plaque. 2. No significant atherosclerotic plaque or evidence of stenosis in the left internal carotid artery. 3. Vertebral arteries are patent with normal antegrade flow.   Myoview on 01/28/2021: Atrial fibrillation present throughout. No diagnostic ST segment changes to indicate ischemia. Small, mild to moderate intensity, mid to apical anterior defect. This is fixed with the exception of a small degree of partial reversibility in the mid zone. Suggestive of scar versus variable soft tissue attenuation. This is a low risk study. Nuclear stress EF: 50%.  Recent Labs: 11/11/2022: ALT 22; B Natriuretic Peptide 137.0; TSH 4.932 11/12/2022: Magnesium 1.7 01/02/2023: BUN  15; Creatinine, Ser 1.20; Hemoglobin 13.7; Platelets 198; Potassium 3.8; Sodium 136  Recent Lipid Panel    Component Value Date/Time   CHOL 185 06/03/2021 0538   TRIG 75 06/03/2021 0538   HDL 66 06/03/2021 0538   CHOLHDL 2.8 06/03/2021 0538   VLDL 15 06/03/2021 0538   LDLCALC 104 (  H) 06/03/2021 0454     Risk Assessment/Calculations:    CHA2DS2-VASc Score = 8  This indicates a 10.8% annual risk of stroke. The patient's score is based upon: CHF History: 1 HTN History: 1 Diabetes History: 0 Stroke History: 2 Vascular Disease History: 1 Age Score: 2 Gender Score: 1     Physical Exam:    VS:  BP 118/72   Pulse 75   Ht 5\' 2"  (1.575 m)   Wt 161 lb 6.4 oz (73.2 kg)   SpO2 96%   BMI 29.52 kg/m     Wt Readings from Last 3 Encounters:  01/09/23 161 lb 6.4 oz (73.2 kg)  01/04/23 162 lb 11.2 oz (73.8 kg)  12/19/22 162 lb 9.6 oz (73.8 kg)     GEN: Well nourished, well developed in no acute distress HEENT: Normal NECK: No JVD; No carotid bruits CARDIAC: S1/S2, RRR, no murmurs, rubs, gallops RESPIRATORY:  Clear to auscultation without rales, wheezing or rhonchi  MUSCULOSKELETAL:  1+ BLE edema (L>R); No deformity  SKIN: Warm and dry NEUROLOGIC:  Alert and oriented x 3 PSYCHIATRIC:  Calm, pleasant  ASSESSMENT:    1. Chronic diastolic heart failure   2. Valvular heart disease   3. Atrial fibrillation, unspecified type   4. Atypical atrial flutter   5. Essential hypertension, benign   6. Hyperlipidemia, unspecified hyperlipidemia type   7. Stenosis of right carotid artery   8. History of CVA (cerebrovascular accident)   9. History of syncope     PLAN:    In order of problems listed above:  Chronic diastolic CHF Recent TTE showed normal EF, no WMA, mild LVH. Euvolemic and well compensated on exam. Continue Lopressor, Torsemide, and potassium supplementation. Low sodium diet, fluid restriction <2L, and daily weights encouraged. Educated to contact our office for  weight gain of 2 lbs overnight or 5 lbs in one week. Heart healthy diet and regular cardiovascular exercise encouraged. Encouraged compression stockings for lower extremity edema, pt declines for me to write Rx at this time.   Valvular heart disease, s/p MVR and AVR She is s/p mitral valve replacement and aortic valve replacement in 2014. Grossly normal function of valves. Denies any symptoms. Continue current medication regimen. Heart healthy diet and regular cardiovascular exercise encouraged. Does require SBE prior to dental work.     A-fib,  A-flutter,s/p cardioversion Patient denies any palpitations. Monitor report as mentioned above. Continue Coumadin and Lopressor- will refill Lopressor per her request. Heart healthy diet and regular cardiovascular exercise encouraged. Follow-up with EP.  HTN BP stable. BP has been stable at home. Discussed to monitor BP at home at least 2 hours after medications and sitting for 5-10 minutes. Continue current meds. Heart healthy diet and regular cardiovascular exercise encouraged.   HLD, Carotid artery stenosis Has upcoming labs with PCP, defers blood work at this time. Continue atorvastatin. Mild stenosis along R ICA, no significant plaque or stenosis along left ICA. Continue current meds. Heart healthy diet and regular cardiovascular exercise encouraged.   Hx of CVA Denies any stroke-like symptoms. Continue coumadin. Continue to follow with PCP. Heart healthy diet and regular cardiovascular exercise encouraged.   Hx of syncope Etiology multifactorial. CT head/ neck, CXR, x-ray of shoulder unremarkable. Cardiology recommended holding AV nodal agents. TTE -> normal EF, mild LVH, moderately elevated PASP at 54.3 mmHg.  Carotid Doppler -> mild 1-49% stenosis of proximal right ICA, no significant stenosis in left ICA.  Was started on metoprolol tartrate 25 mg twice daily,  recommended to hold diltiazem and ARB. 14-day Zio patch results above. Denies any  recurrent syncopal episodes. Continue to follow-up with EP.   9. Disposition: Follow-up with Dr. Dina Rich in 6 months or sooner if anything changes.   Medication Adjustments/Labs and Tests Ordered: Current medicines are reviewed at length with the patient today.  Concerns regarding medicines are outlined above.  No orders of the defined types were placed in this encounter.  No orders of the defined types were placed in this encounter.   Patient Instructions  Medication Instructions:  Your physician recommends that you continue on your current medications as directed. Please refer to the Current Medication list given to you today.   Labwork: none  Testing/Procedures: none  Follow-Up:  Your physician recommends that you schedule a follow-up appointment in: 6 months  Any Other Special Instructions Will Be Listed Below (If Applicable).  If you need a refill on your cardiac medications before your next appointment, please call your pharmacy.    SignedSharlene Dory, NP  01/09/2023 10:44 AM    [facility]

## 2023-01-09 NOTE — Patient Instructions (Signed)

## 2023-01-10 DIAGNOSIS — I6521 Occlusion and stenosis of right carotid artery: Secondary | ICD-10-CM | POA: Diagnosis not present

## 2023-01-10 DIAGNOSIS — I5032 Chronic diastolic (congestive) heart failure: Secondary | ICD-10-CM | POA: Diagnosis not present

## 2023-01-10 DIAGNOSIS — M19011 Primary osteoarthritis, right shoulder: Secondary | ICD-10-CM | POA: Diagnosis not present

## 2023-01-10 DIAGNOSIS — G8929 Other chronic pain: Secondary | ICD-10-CM | POA: Diagnosis not present

## 2023-01-10 DIAGNOSIS — M7581 Other shoulder lesions, right shoulder: Secondary | ICD-10-CM | POA: Diagnosis not present

## 2023-01-10 DIAGNOSIS — I110 Hypertensive heart disease with heart failure: Secondary | ICD-10-CM | POA: Diagnosis not present

## 2023-01-10 DIAGNOSIS — M7521 Bicipital tendinitis, right shoulder: Secondary | ICD-10-CM | POA: Diagnosis not present

## 2023-01-10 DIAGNOSIS — I4892 Unspecified atrial flutter: Secondary | ICD-10-CM | POA: Diagnosis not present

## 2023-01-10 DIAGNOSIS — M75111 Incomplete rotator cuff tear or rupture of right shoulder, not specified as traumatic: Secondary | ICD-10-CM | POA: Diagnosis not present

## 2023-01-12 ENCOUNTER — Ambulatory Visit: Payer: Medicare PPO | Attending: Cardiology | Admitting: *Deleted

## 2023-01-12 DIAGNOSIS — Z5181 Encounter for therapeutic drug level monitoring: Secondary | ICD-10-CM

## 2023-01-12 DIAGNOSIS — Z952 Presence of prosthetic heart valve: Secondary | ICD-10-CM | POA: Diagnosis not present

## 2023-01-12 LAB — POCT INR: INR: 5.6 — AB (ref 2.0–3.0)

## 2023-01-12 NOTE — Patient Instructions (Signed)
S/P cardioversion on 01/04/23. Hold warfarin x 3 days then decrease dose to 1 tablet daily except 1/2 tablet on Mondays and Thursdays Recheck INR in 1 week  Denies s/s of bleeding.  Bleeding and fall precautions discussed with pt/husband and they verbalized understanding.Marland Kitchen

## 2023-01-16 ENCOUNTER — Ambulatory Visit: Payer: Medicare PPO | Attending: Cardiology | Admitting: *Deleted

## 2023-01-16 ENCOUNTER — Other Ambulatory Visit: Payer: Self-pay | Admitting: Internal Medicine

## 2023-01-16 ENCOUNTER — Other Ambulatory Visit (HOSPITAL_COMMUNITY): Payer: Self-pay | Admitting: Internal Medicine

## 2023-01-16 DIAGNOSIS — E785 Hyperlipidemia, unspecified: Secondary | ICD-10-CM | POA: Diagnosis not present

## 2023-01-16 DIAGNOSIS — Z952 Presence of prosthetic heart valve: Secondary | ICD-10-CM

## 2023-01-16 DIAGNOSIS — Z853 Personal history of malignant neoplasm of breast: Secondary | ICD-10-CM | POA: Diagnosis not present

## 2023-01-16 DIAGNOSIS — N1831 Chronic kidney disease, stage 3a: Secondary | ICD-10-CM | POA: Diagnosis not present

## 2023-01-16 DIAGNOSIS — I5032 Chronic diastolic (congestive) heart failure: Secondary | ICD-10-CM | POA: Diagnosis not present

## 2023-01-16 DIAGNOSIS — I700 Atherosclerosis of aorta: Secondary | ICD-10-CM | POA: Diagnosis not present

## 2023-01-16 DIAGNOSIS — Z5181 Encounter for therapeutic drug level monitoring: Secondary | ICD-10-CM | POA: Diagnosis not present

## 2023-01-16 DIAGNOSIS — E039 Hypothyroidism, unspecified: Secondary | ICD-10-CM | POA: Diagnosis not present

## 2023-01-16 DIAGNOSIS — I483 Typical atrial flutter: Secondary | ICD-10-CM | POA: Diagnosis not present

## 2023-01-16 DIAGNOSIS — E1122 Type 2 diabetes mellitus with diabetic chronic kidney disease: Secondary | ICD-10-CM | POA: Diagnosis not present

## 2023-01-16 DIAGNOSIS — R7309 Other abnormal glucose: Secondary | ICD-10-CM | POA: Diagnosis not present

## 2023-01-16 DIAGNOSIS — Z1231 Encounter for screening mammogram for malignant neoplasm of breast: Secondary | ICD-10-CM

## 2023-01-16 LAB — POCT INR: INR: 1.8 — AB (ref 2.0–3.0)

## 2023-01-16 NOTE — Progress Notes (Unsigned)
  Electrophysiology Office Note:   Date:  01/17/2023  ID:  [name], DOB [birth year], MRN [id number]  Primary Cardiologist: Dina Rich, MD Electrophysiologist: Maurice Small, MD   History of Present Illness:   [name] is a 77 y.o. female with h/o CVA, DM2, rheumatic heart disease with replacement of both aortic and mitral valves with bioprosthetic device AFL, Syncope, and HTN seen today for routine electrophysiology followup.   Seen in office 3/18 and noted to be in AFL (likely for up the last 3 years. And scheduled for [facility] 4/3 to NSR.   Since last being seen in our clinic the patient reports doing very well. She has not particularly noticed a difference, for better or worse, s/p cardioversion.  she denies chest pain, palpitations, dyspnea, PND, orthopnea, nausea, vomiting, dizziness, syncope, edema, weight gain, or early satiety.   Review of systems complete and found to be negative unless listed in HPI.   Studies Reviewed:    EKG is ordered today. Personal review shows NSR at 71 bpm with 1st degree AV block  Risk Assessment/Calculations:            Physical Exam:   VS:  BP 130/80   Pulse 71   Ht 5\' 2"  (1.575 m)   Wt 161 lb 9.6 oz (73.3 kg)   SpO2 95%   BMI 29.56 kg/m    Wt Readings from Last 3 Encounters:  01/17/23 161 lb 9.6 oz (73.3 kg)  01/09/23 161 lb 6.4 oz (73.2 kg)  01/04/23 162 lb 11.2 oz (73.8 kg)     GEN: Well nourished, well developed in no acute distress NECK: No JVD; No carotid bruits CARDIAC: Regular rate and rhythm, no murmurs, rubs, gallops RESPIRATORY:  Clear to auscultation without rales, wheezing or rhonchi  ABDOMEN: Soft, non-tender, non-distended EXTREMITIES:  No edema; No deformity   ASSESSMENT AND PLAN:    Atrial flutter, longstanding persistent  EKG today shows NSR 71 bpm with 1st degree AV block s/p [facility] 4/3 No good AAD options Avoid 1c with structural disease QT too long for tikosyn Rash on amio -> Could rechallenge  if needed and pt willing in the future Not a good candidate ablation given VHD and atypical flutter.  Continue metoprolol  Continue coumadin (valves) for CHA2DS2/VASc of at least 8.   H/o Syncope Follow.  Felt orthostatic and 2/2 shoulder pain  HTN Stable on current regimen   VHD S/p MVR/AVR in setting of rheumatic disease   Follow up with Dr. Nelly Laurence in 3 months  Signed, Graciella Freer, PA-C

## 2023-01-16 NOTE — Patient Instructions (Signed)
S/P cardioversion on 01/04/23. Take warfarin 1 1/2 tablets tonight then restart 1 tablet daily Recheck INR in 1 week

## 2023-01-17 ENCOUNTER — Encounter: Payer: Self-pay | Admitting: Student

## 2023-01-17 ENCOUNTER — Ambulatory Visit: Payer: Medicare PPO | Attending: Student | Admitting: Student

## 2023-01-17 VITALS — BP 130/80 | HR 71 | Ht 62.0 in | Wt 161.6 lb

## 2023-01-17 DIAGNOSIS — Z8673 Personal history of transient ischemic attack (TIA), and cerebral infarction without residual deficits: Secondary | ICD-10-CM | POA: Diagnosis not present

## 2023-01-17 DIAGNOSIS — I10 Essential (primary) hypertension: Secondary | ICD-10-CM

## 2023-01-17 DIAGNOSIS — I484 Atypical atrial flutter: Secondary | ICD-10-CM | POA: Diagnosis not present

## 2023-01-17 DIAGNOSIS — Z952 Presence of prosthetic heart valve: Secondary | ICD-10-CM | POA: Diagnosis not present

## 2023-01-17 DIAGNOSIS — I38 Endocarditis, valve unspecified: Secondary | ICD-10-CM | POA: Diagnosis not present

## 2023-01-17 NOTE — Patient Instructions (Signed)
Medication Instructions:  Your physician recommends that you continue on your current medications as directed. Please refer to the Current Medication list given to you today.  *If you need a refill on your cardiac medications before your next appointment, please call your pharmacy*  Lab Work: None ordered If you have labs (blood work) drawn today and your tests are completely normal, you will receive your results only by: MyChart Message (if you have MyChart) OR A paper copy in the mail If you have any lab test that is abnormal or we need to change your treatment, we will call you to review the results.  Follow-Up: At [facility], you and your health needs are our priority.  As part of our continuing mission to provide you with exceptional heart care, we have created designated Provider Care Teams.  These Care Teams include your primary Cardiologist (physician) and Advanced Practice Providers (APPs -  Physician Assistants and Nurse Practitioners) who all work together to provide you with the care you need, when you need it.  Your next appointment:   3 month(s)  Provider:   York Pellant, MD

## 2023-01-20 ENCOUNTER — Ambulatory Visit (HOSPITAL_COMMUNITY): Payer: Medicare PPO

## 2023-01-24 ENCOUNTER — Ambulatory Visit: Payer: Medicare PPO | Attending: Cardiology | Admitting: *Deleted

## 2023-01-24 DIAGNOSIS — Z5181 Encounter for therapeutic drug level monitoring: Secondary | ICD-10-CM

## 2023-01-24 DIAGNOSIS — Z952 Presence of prosthetic heart valve: Secondary | ICD-10-CM | POA: Diagnosis not present

## 2023-01-24 LAB — POCT INR: INR: 2.9 (ref 2.0–3.0)

## 2023-01-24 NOTE — Patient Instructions (Signed)
S/P cardioversion on 01/04/23. Continue warfarin 1 tablet daily Recheck INR in 3 weeks

## 2023-01-30 ENCOUNTER — Other Ambulatory Visit: Payer: Self-pay | Admitting: Internal Medicine

## 2023-01-30 DIAGNOSIS — R0609 Other forms of dyspnea: Secondary | ICD-10-CM

## 2023-02-01 ENCOUNTER — Ambulatory Visit (HOSPITAL_COMMUNITY)
Admission: RE | Admit: 2023-02-01 | Discharge: 2023-02-01 | Disposition: A | Discharge status: Home / Self Care | Payer: Medicare PPO | Source: Ambulatory Visit | Attending: Internal Medicine | Admitting: Internal Medicine

## 2023-02-01 ENCOUNTER — Encounter (HOSPITAL_COMMUNITY): Payer: Self-pay

## 2023-02-01 DIAGNOSIS — Z1231 Encounter for screening mammogram for malignant neoplasm of breast: Secondary | ICD-10-CM | POA: Insufficient documentation

## 2023-02-06 ENCOUNTER — Encounter: Payer: Self-pay | Admitting: Cardiovascular Disease

## 2023-02-06 NOTE — Telephone Encounter (Signed)
Reports fatigue, palpitations and SOB that started 2 weeks ago. Says she thinks she is in A-Fib again.  Reports resting HR ranging around 95-110 and BP 110/70 BP this morning was 122/65 & HR 101 Reports checking BP and HR with a home BP monitor and says monitor has been checked for accuracy Reports weights are stable Medications reviewed  Denies dizziness, chest pain. Patient did not sound SOB on phone. Gave first available appointment to see Philis Nettle on 02/09/2023 @3 :30 pm. Advised if she develops worsening symptoms, to go to the ED for an evaluation.

## 2023-02-09 ENCOUNTER — Encounter: Payer: Self-pay | Admitting: Nurse Practitioner

## 2023-02-09 ENCOUNTER — Ambulatory Visit: Payer: Medicare PPO | Attending: Nurse Practitioner | Admitting: Nurse Practitioner

## 2023-02-09 VITALS — BP 122/64 | HR 99 | Ht 62.0 in | Wt 164.8 lb

## 2023-02-09 DIAGNOSIS — Z87898 Personal history of other specified conditions: Secondary | ICD-10-CM

## 2023-02-09 DIAGNOSIS — R000 Tachycardia, unspecified: Secondary | ICD-10-CM | POA: Diagnosis not present

## 2023-02-09 DIAGNOSIS — I6521 Occlusion and stenosis of right carotid artery: Secondary | ICD-10-CM | POA: Diagnosis not present

## 2023-02-09 DIAGNOSIS — I5032 Chronic diastolic (congestive) heart failure: Secondary | ICD-10-CM | POA: Diagnosis not present

## 2023-02-09 DIAGNOSIS — I10 Essential (primary) hypertension: Secondary | ICD-10-CM | POA: Diagnosis not present

## 2023-02-09 DIAGNOSIS — R002 Palpitations: Secondary | ICD-10-CM

## 2023-02-09 DIAGNOSIS — N183 Chronic kidney disease, stage 3 unspecified: Secondary | ICD-10-CM

## 2023-02-09 DIAGNOSIS — Z8673 Personal history of transient ischemic attack (TIA), and cerebral infarction without residual deficits: Secondary | ICD-10-CM

## 2023-02-09 DIAGNOSIS — I4892 Unspecified atrial flutter: Secondary | ICD-10-CM | POA: Diagnosis not present

## 2023-02-09 DIAGNOSIS — I4891 Unspecified atrial fibrillation: Secondary | ICD-10-CM

## 2023-02-09 DIAGNOSIS — E785 Hyperlipidemia, unspecified: Secondary | ICD-10-CM | POA: Diagnosis not present

## 2023-02-09 DIAGNOSIS — Z952 Presence of prosthetic heart valve: Secondary | ICD-10-CM

## 2023-02-09 MED ORDER — METOPROLOL TARTRATE 100 MG PO TABS: 100.0000 mg | ORAL_TABLET | Freq: Two times a day (BID) | ORAL | 6 refills | Status: DC

## 2023-02-09 NOTE — Progress Notes (Signed)
Cardiology Office Note:    Date:  02/09/2023  ID:  [name], DOB [birth year], MRN [id number]  PCP:  Carylon Perches, MD   [facility] Providers Cardiologist:  Dina Rich, MD Electrophysiologist:  Maurice Small, MD     Referring MD: Carylon Perches, MD   CC: Here for follow-up  History of Present Illness:    [name] is a 77 y.o. female with a hx of the following:  A-fib Valvular heart disease, s/p MVR and AVR Chronic diastolic CHF Shortness of breath Hypertension Hyperlipidemia Carotid artery stenosis History of CVA  Patient is a 77 year old female with past medical history as mentioned above.  Underwent mitral valve replacement with 25 mm Edwards Magna-ease pericardial valve and also aortic valve replacement with 21 mm Edwards Magna-ease pericardial valve in 2014.  Admitted 03/2022 with hypoxia and shortness of breath.  TTE  revealed EF 60%, normal MVR and AVR. CXR revealed R base atelectasis/developing infection, loculated pleural fluid and right lateral lung slightly increased from prior, with possible component of CHF.  Seen by Dr. Dina Rich on October 10, 2022. Noted some ongoing issues with intermittent fluid volume overload, hypoxia at home. Symptoms improved since diuretic change.  Torsemide was changed to 40 mg in the morning and 20 mg in the evening, and may take an extra 20 mg daily as needed for swelling or shortness of breath.  Chest x-ray ordered.    Admitted 11/2022 due to syncope with A-flutter and bradycardia.  Noted to have 2 syncopal episodes on the morning of admission, both were witnessed. Poor appetite prior to event.  EKG showed a flutter with 4:1 block cardiology was consulted.  CT of the head and neck was negative.  Chest x-ray and x-ray of shoulder showed nothing acute. Transferred to [facility] for further workup and EP evaluation.  Cardiology recommended to hold AV nodal agents continue Coumadin. TTE report below. Carotid Doppler showed  mild 1-49% stenosis of proximal right ICA, no significant stenosis in left ICA.  EP recommended checking orthostatics and resuming beta-blocker.  Was started on metoprolol tartrate 25 mg twice daily, cardiology recommended to hold diltiazem and ARB, was told to resume torsemide 20 mg daily at discharge.  14-day Zio patch was arranged and report noted below.   Last saw this pt 01/09/2023 - Presented for outpatient follow-up post DCCV.  Was doing well and was increasing her physical activity.  She resumed walking and cooking.  Tolerating her medications well. Denied any chest pain, shortness of breath, palpitations, syncope, presyncope, dizziness, orthopnea, PND,  significant weight changes, acute bleeding, or claudication.  02/09/2023 - She recently contacted our office on 02/06/2023 noting palpitations, fatigue, and shortness of breath that started around 2 weeks ago, believed she was back in A-fib again. Said HR ranged from 95 bpm - 110 bpm. BP and weights stable. Denied chest pain or dizziness. Today she presents for evaluation with her husband. Says she feels like her HR is going faster, endorses DOE with this, denies any CP. HR today 99 bpm. Husband verifies that BP is stable at home. Denies any syncope, presyncope, dizziness, orthopnea, PND, significant weight changes, acute bleeding, or claudication.  Past Medical History:  Diagnosis Date   Anemia    Asthma    Atrial fibrillation (HCC)    Breast carcinoma (HCC) 1995   1995   Cardioembolic stroke (HCC) 01/2012   Right frontal in 01/2012; normal carotid ultrasound; possible LAA thrombus by TEE; virtual complete neurologic recovery  Chronic kidney disease, stage 2, mildly decreased GFR    GFR of approximately 60   Depression    Diabetes mellitus without complication (HCC)    Diverticulosis of colon (without mention of hemorrhage) 2012   Dr. Karilyn Cota   Fasting hyperglycemia    120 fasting   Gastroesophageal reflux disease    Gastroparesis     Hemorrhoids    Hyperlipidemia    Hypertension    pt denies 05/30/13     Dr Hinton Rao   Hyponatremia    Rheumatic heart disease    a. 07/2013 Echo: Ef 55-60%, Mod AS/AI, Mod-Sev MS, sev dil LA, PASP 58;  b. 07/2013 TEE: EF 35-40%, mild-mod AS/AI, mod-sev MS, LA smoke;  c. 07/2013 Cath: elev R heart pressures, nl cors.   Shortness of breath     Past Surgical History:  Procedure Laterality Date   AORTIC VALVE REPLACEMENT N/A 07/18/2013   Procedure: AORTIC VALVE REPLACEMENT (AVR);  Surgeon: [name], MD;  Location: [facility] OR;  Service: Open Heart Surgery;  Laterality: N/A;   BACK SURGERY     BREAST LUMPECTOMY Right 1995   CARDIAC CATHETERIZATION     CARDIOVERSION N/A 07/26/2013   Procedure: CARDIOVERSION;  Surgeon: Pricilla Riffle, MD;  Location:  [facility] ENDOSCOPY;  Service: Cardiovascular;  Laterality: N/A;   CARDIOVERSION N/A 01/04/2023   Procedure: CARDIOVERSION;  Surgeon: Meriam Sprague, MD;  Location: [facility] ENDOSCOPY;  Service: Cardiovascular;  Laterality: N/A;   COLONOSCOPY  2012   Negative screening procedure   DILATION AND CURETTAGE OF UTERUS     ESOPHAGEAL MANOMETRY N/A 06/17/2013   Procedure: ESOPHAGEAL MANOMETRY (EM);  Surgeon: Mardella Layman, MD;  Location: [facility] ENDOSCOPY;  Service: Endoscopy;  Laterality: N/A;   INTRAOPERATIVE TRANSESOPHAGEAL ECHOCARDIOGRAM N/A 07/18/2013   Procedure: INTRAOPERATIVE TRANSESOPHAGEAL ECHOCARDIOGRAM;  Surgeon: [name], MD;  Location: [facility] OR;  Service: Open Heart Surgery;  Laterality: N/A;   LEFT AND RIGHT HEART CATHETERIZATION WITH CORONARY ANGIOGRAM N/A 07/10/2013   Procedure: LEFT AND RIGHT HEART CATHETERIZATION WITH CORONARY ANGIOGRAM;  Surgeon: Kathleene Hazel, MD;  Location: [facility] CATH LAB;  Service: Cardiovascular;  Laterality: N/A;   LUMBAR LAMINECTOMY/DECOMPRESSION MICRODISCECTOMY Right 05/13/2014   Procedure: LUMBAR LAMINECTOMY/DECOMPRESSION MICRODISCECTOMY 1 LEVEL  lumbar four/five;  Surgeon: Reinaldo Meeker, MD;  Location:  [facility] NEURO ORS;  Service: Neurosurgery;  Laterality: Right;   MITRAL VALVE REPLACEMENT N/A 07/18/2013   Procedure: MITRAL VALVE (MV) REPLACEMENT;  Surgeon: [name], MD;  Location: [facility] OR;  Service: Open Heart Surgery;  Laterality: N/A;   MITRAL VALVE SURGERY  [facility], closed mitral valvulotomy by finger fracture   TEE WITHOUT CARDIOVERSION  01/24/2012   Procedure: TRANSESOPHAGEAL ECHOCARDIOGRAM (TEE);  Surgeon: Lewayne Bunting, MD;  Location: [facility] ENDOSCOPY;  Service: Cardiovascular;  Laterality: N/A;   TEE WITHOUT CARDIOVERSION N/A 07/11/2013   Procedure: TRANSESOPHAGEAL ECHOCARDIOGRAM (TEE);  Surgeon: Vesta Mixer, MD;  Location: [facility] ENDOSCOPY;  Service: Cardiovascular;  Laterality: N/A;   TEE WITHOUT CARDIOVERSION N/A 07/26/2013   Procedure: TRANSESOPHAGEAL ECHOCARDIOGRAM (TEE);  Surgeon: Pricilla Riffle, MD;  Location: [facility] ENDOSCOPY;  Service: Cardiovascular;  Laterality: N/A;   TUBAL LIGATION  1973   Current Meds  Medication Sig   albuterol (PROVENTIL) (2.5 MG/3ML) 0.083% nebulizer solution USE 1 VIAL IN NEBULIZER EVERY4 HOURS AS NEEDED.   albuterol (VENTOLIN HFA) 108 (90 Base) MCG/ACT inhaler Inhale 1-2 puffs into the lungs every 6 (six) hours as needed for wheezing or shortness of breath.  atorvastatin (LIPITOR) 40 MG tablet TAKE ONE TABLET BY MOUTH ONCE DAILY.   budesonide (PULMICORT) 0.5 MG/2ML nebulizer solution USE 1/2 VIAL IN NEBULIZER TWICE DAILY. DISCARD UNUSED PORTION AFTER EACH USE. (Patient taking differently: Take 0.25 mg by nebulization 2 (two) times daily. USE 1/2 VIAL IN NEBULIZER TWICE DAILY. DISCARD UNUSED PORTION AFTER EACH USE.)   dextromethorphan-guaiFENesin (MUCINEX DM) 30-600 MG 12hr tablet Take 1 tablet by mouth 2 (two) times daily as needed for cough.   escitalopram (LEXAPRO) 10 MG tablet Take 10 mg by mouth daily with breakfast.   famotidine (PEPCID) 20 MG tablet TAKE 1 TABLET AFTER SUPPER   fexofenadine (ALLEGRA) 180 MG tablet Take 180 mg by mouth in the  morning.   fluticasone (FLONASE) 50 MCG/ACT nasal spray Place 1 spray into both nostrils daily.   gabapentin (NEURONTIN) 100 MG capsule TAKE 1 CAPSULE BY MOUTH FOUR TIMES DAILY.   levothyroxine (SYNTHROID) 100 MCG tablet Take 100 mcg by mouth daily before breakfast.   metFORMIN (GLUCOPHAGE) 500 MG tablet Take 1 tablet (500 mg total) by mouth 2 (two) times daily with a meal.   Multiple Vitamin (MULITIVITAMIN WITH MINERALS) TABS Take 1 tablet by mouth daily.   pantoprazole (PROTONIX) 40 MG tablet TAKE 1 TABLET 30 TO 60 MINUTES BEFORE FIRST MEAL OF THE DAY. (Patient taking differently: Take 40 mg by mouth daily before breakfast.)   Polyethyl Glycol-Propyl Glycol (LUBRICANT EYE DROPS) 0.4-0.3 % SOLN Place 1-2 drops into both eyes 3 (three) times daily as needed (dry/irritated eyes.).   potassium chloride SA (KLOR-CON M) 20 MEQ tablet Take 1 tablet (20 mEq total) by mouth daily.   torsemide (DEMADEX) 20 MG tablet Take 1 tablet (20 mg total) by mouth daily.   warfarin (COUMADIN) 2.5 MG tablet TAKE ONE TO 1.5 TABLETS BY MOUTH ONCE DAILY AS DIRECTED BY COUMADIN CLINIC. (Patient taking differently: Take 2.5-3.75 mg by mouth See admin instructions. Take 1.5 tablets (3.75 mg) by mouth twice weekly on Sundays & Thursdays in the evening. Take 1 tablet (2.5 mg) by mouth on Mondays, Tuesdays, Wednesdays, Fridays & Saturdays in the evening.)   metoprolol tartrate (LOPRESSOR) 25 MG tablet Take 3 tablets (75 mg total) by mouth 2 (two) times daily.   Allergies:   Amiodarone   Social History   Socioeconomic History   Marital status: Married    Spouse name: Not on file   Number of children: 1   Years of education: Not on file   Highest education level: Not on file  Occupational History   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  Tobacco Use   Smoking status: Never   Smokeless tobacco: Never  Vaping Use   Vaping Use: Never used  Substance and Sexual Activity   Alcohol use: No    Alcohol/week: 0.0  standard drinks of alcohol   Drug use: No   Sexual activity: Not Currently  Other Topics Concern   Not on file  Social History Narrative   Married with children   No regular exercise   Social Determinants of Health   Financial Resource Strain: Not on file  Food Insecurity: No Food Insecurity (11/11/2022)   Hunger Vital Sign    Worried About Running Out of Food in the Last Year: Never true    Ran Out of Food in the Last Year: Never true  Transportation Needs: No Transportation Needs (11/11/2022)   PRAPARE - Administrator, Civil Service (Medical): No    Lack of Transportation (Non-Medical): No  Physical Activity: Not on file  Stress: Not on file  Social Connections: Not on file     Family History: The patient's family history includes Asthma in her maternal grandfather; Cirrhosis in her father; Diabetes in her sister; Heart disease (age of onset: 67) in her brother; Hypothyroidism in her mother; Rheum arthritis in her maternal grandmother.  ROS:   Please see the history of present illness.     All other systems reviewed and are negative.  EKGs/Labs/Other Studies Reviewed:    The following studies were reviewed today:   EKG:  EKG is ordered today and reviewed with Dr. Jenene Slicker (DOD). EKG demonstrates SR with first degree AV block, 99 bpm, nonspecific ST wave abnormality, no acute ischemic changes.   Cardiac monitor 12/2022:   14 day monitor   Atrial flutter throughout the study, min rate 55, max rate 155, avg rate 106   Rare ventricular ectopy in the form of isolated PVCs, couplets, triplets   No symptoms reported     Patch Wear Time:  13 days and 18 hours (2024-02-12T11:46:18-0500 to 2024-02-26T05:53:24-0500)   Atrial Flutter occurred continuously (100% burden), ranging from 55-150 bpm (avg of 106 bpm). Atrial Flutter may be possible Atrial Tachycardia with variable block. Isolated VEs were rare (<1.0%, 11219), VE Couplets were rare (<1.0%, 696), and VE   Triplets were rare (<1.0%, 2). Ventricular Trigeminy was present. Previously notified: MD notification criteria for First Documentation of Atrial Flutter met - Notified Dr. Lisabeth Devoid on 14 Nov 2022 at 5:57 PM EST ([place]).    Echocardiogram on 11/12/2022:  1. Left ventricular ejection fraction, by estimation, is 55 to 60%. The  left ventricle has normal function. The left ventricle has no regional  wall motion abnormalities. There is mild left ventricular hypertrophy.  Indeterminate diastolic filling due to  E-A fusion.   2. Right ventricular systolic function was not well visualized. The right  ventricular size is normal. There is moderately elevated pulmonary artery  systolic pressure. The estimated right ventricular systolic pressure is  54.3 mmHg.   3. Left atrial size was mildly dilated.   4. The mitral valve has been repaired/replaced. No evidence of mitral  valve regurgitation. No evidence of mitral stenosis. The mean mitral valve  gradient is 7.0 mmHg at HR of 105 bpm. There is a 25 mm Magna Ease  bioprosthetic valve present in the mitral   position. Procedure Date: 2014. Grossly normal function.   5. The aortic valve has been repaired/replaced. Aortic valve  regurgitation is not visualized. No aortic stenosis is present. There is a  21 mm Magna Ease valve present in the aortic position. Procedure Date:  2014. Aortic valve mean gradient measures 10.0   mmHg. Aortic valve Vmax measures 2.13 m/s. Grossly normal function.   6. The inferior vena cava is normal in size with greater than 50%  respiratory variability, suggesting right atrial pressure of 3 mmHg.   Comparison(s): No significant change from prior study.  Carotid duplex on 11/11/2022: IMPRESSION: 1. Mild (1-49%) stenosis proximal right internal carotid artery secondary to trace heterogeneous atherosclerotic plaque. 2. No significant atherosclerotic plaque or evidence of stenosis in the left internal carotid artery. 3. Vertebral  arteries are patent with normal antegrade flow.   Myoview on 01/28/2021: Atrial fibrillation present throughout. No diagnostic ST segment changes to indicate ischemia. Small, mild to moderate intensity, mid to apical anterior defect. This is fixed with the exception of a small degree of partial reversibility in the mid zone. Suggestive of scar  versus variable soft tissue attenuation. This is a low risk study. Nuclear stress EF: 50%.  Recent Labs: 11/11/2022: ALT 22; B Natriuretic Peptide 137.0; TSH 4.932 11/12/2022: Magnesium 1.7 01/02/2023: BUN 15; Creatinine, Ser 1.20; Hemoglobin 13.7; Platelets 198; Potassium 3.8; Sodium 136  Recent Lipid Panel    Component Value Date/Time   CHOL 185 06/03/2021 0538   TRIG 75 06/03/2021 0538   HDL 66 06/03/2021 0538   CHOLHDL 2.8 06/03/2021 0538   VLDL 15 06/03/2021 0538   LDLCALC 104 (H) 06/03/2021 0538     Risk Assessment/Calculations:    CHA2DS2-VASc Score = 8  This indicates a 10.8% annual risk of stroke. The patient's score is based upon: CHF History: 1 HTN History: 1 Diabetes History: 0 Stroke History: 2 Vascular Disease History: 1 Age Score: 2 Gender Score: 1     Physical Exam:    VS:  BP 122/64   Pulse 99   Ht 5\' 2"  (1.575 m)   Wt 164 lb 12.8 oz (74.8 kg)   SpO2 97%   BMI 30.14 kg/m     Wt Readings from Last 3 Encounters:  02/09/23 164 lb 12.8 oz (74.8 kg)  01/17/23 161 lb 9.6 oz (73.3 kg)  01/09/23 161 lb 6.4 oz (73.2 kg)     GEN: Well nourished, well developed in no acute distress HEENT: Normal NECK: No JVD; No carotid bruits CARDIAC: S1/S2, fast rate and regular rhythm, no murmurs, rubs, gallops RESPIRATORY:  Clear to auscultation without rales, wheezing or rhonchi  MUSCULOSKELETAL:  1+ BLE edema (L>R - chronic per husband's report); No deformity  SKIN: Warm and dry NEUROLOGIC:  Alert and oriented x 3 PSYCHIATRIC:  Calm, pleasant  ASSESSMENT:    1. Atrial fibrillation, unspecified type (HCC)   2. Atrial  flutter, unspecified type (HCC)   3. Tachycardia   4. Palpitations   5. Chronic diastolic heart failure (HCC)   6. S/P MVR (mitral valve replacement)   7. S/P aortic valve replacement   8. Essential hypertension, benign   9. Hyperlipidemia, unspecified hyperlipidemia type   10. Stenosis of right carotid artery   11. History of CVA (cerebrovascular accident)   65. History of syncope   13. Stage 3 chronic kidney disease, unspecified whether stage 3a or 3b CKD (HCC)      PLAN:    In order of problems listed above:  A-fib, A-flutter, s/p DCCV, Tachycardia/ Palpitations Patient does note recent palpitations with HR ranging from 95 bpm to 110 bpm. Previous monitor report as mentioned above. Recent labs stable. Continue Coumadin and will increase Lopressor to 100 mg BID - consulted both DOD (Dr. Jenene Slicker) and EP (Dr. Nelly Laurence) regarding this. Heart healthy diet and regular cardiovascular exercise encouraged. Follow-up with EP as scheduled. ED precautions discussed.   Chronic diastolic CHF TTE 10/6107 showed normal EF, no WMA, mild LVH. Euvolemic and well compensated on exam. Continue Lopressor, Torsemide, and potassium supplementation. Will increase Lopressor as mentioned below. Low sodium diet, fluid restriction <2L, and daily weights encouraged. Educated to contact our office for weight gain of 2 lbs overnight or 5 lbs in one week. Heart healthy diet and regular cardiovascular exercise encouraged.   Valvular heart disease, s/p MVR and AVR She is s/p mitral valve replacement and aortic valve replacement in 2014. Grossly normal function of valves. Denies any symptoms. Continue current medication regimen. Heart healthy diet and regular cardiovascular exercise encouraged. Does require SBE prior to dental work.   HTN BP stable. BP has been stable at  home. Discussed to monitor BP at home at least 2 hours after medications and sitting for 5-10 minutes. Increasing Lopressor. Continue rest of current  medication regimen. Heart healthy diet and regular cardiovascular exercise encouraged.   HLD, Carotid artery stenosis Recent LDL 80. Continue atorvastatin. Mild stenosis along R ICA, no significant plaque or stenosis along left ICA. Enies any symptoms. Continue current meds. Heart healthy diet and regular cardiovascular exercise encouraged.   Hx of CVA Denies any stroke-like symptoms. Continue coumadin. Continue to follow with PCP. Heart healthy diet and regular cardiovascular exercise encouraged.   Hx of syncope Etiology multifactorial. Previous CT head/ neck, CXR, x-ray of shoulder unremarkable.  TTE -> normal EF, mild LVH, moderately elevated PASP at 54.3 mmHg.  Carotid Doppler -> mild 1-49% stenosis of proximal right ICA, no significant stenosis in left ICA.  Was started on metoprolol tartrate 25 mg twice daily, recommended to hold diltiazem and ARB. 14-day Zio patch results above. Denies any recurrent syncopal episodes. Continue to follow-up with EP.   8. CKD stage 2-3 Baseline Cr 1.10-1.20. Recent sCr stable. Avoid nephrotoxic agents. Encouraged adequate hydration. Continue to follow with PCP.  9. Disposition: Follow-up with Dr. Nelly Laurence and Dina Rich  as scheduled or sooner if anything changes.    Medication Adjustments/Labs and Tests Ordered: Current medicines are reviewed at length with the patient today.  Concerns regarding medicines are outlined above.  Orders Placed This Encounter  Procedures   EKG 12-Lead   Meds ordered this encounter  Medications   metoprolol tartrate (LOPRESSOR) 100 MG tablet    Sig: Take 1 tablet (100 mg total) by mouth 2 (two) times daily.    Dispense:  60 tablet    Refill:  6    Dose increased 02/09/2023    Patient Instructions  Medication Instructions:   Increase Lopressor to 100mg  twice a day Continue all other medications.     Labwork:  none  Testing/Procedures:  none  Follow-Up:  Dr. Nelly Laurence as planned   Any Other Special  Instructions Will Be Listed Below (If Applicable).   If you need a refill on your cardiac medications before your next appointment, please call your pharmacy.    Signed, Sharlene Dory, NP  02/09/2023 9:22 PM    [facility]

## 2023-02-09 NOTE — Patient Instructions (Addendum)
Medication Instructions:   Increase Lopressor to 100mg  twice a day Continue all other medications.     Labwork:  none  Testing/Procedures:  none  Follow-Up:  Dr. Nelly Laurence as planned   Any Other Special Instructions Will Be Listed Below (If Applicable).   If you need a refill on your cardiac medications before your next appointment, please call your pharmacy.

## 2023-02-10 ENCOUNTER — Ambulatory Visit: Payer: Medicare PPO | Admitting: Internal Medicine

## 2023-02-10 ENCOUNTER — Encounter: Payer: Self-pay | Admitting: Internal Medicine

## 2023-02-10 VITALS — BP 96/60 | HR 96 | Temp 98.3°F | Ht 62.5 in | Wt 158.8 lb

## 2023-02-10 DIAGNOSIS — R058 Other specified cough: Secondary | ICD-10-CM

## 2023-02-10 LAB — NITRIC OXIDE: Nitric Oxide: 29

## 2023-02-10 MED ORDER — FORMOTEROL FUMARATE 20 MCG/2ML IN NEBU
20.0000 ug | INHALATION_SOLUTION | Freq: Two times a day (BID) | RESPIRATORY_TRACT | 11 refills | Status: DC
Start: 2023-02-10 — End: 2023-08-10

## 2023-02-10 MED ORDER — GABAPENTIN 300 MG PO CAPS: 300.0000 mg | ORAL_CAPSULE | Freq: Two times a day (BID) | ORAL | 11 refills | Status: DC

## 2023-02-10 MED ORDER — METHYLPREDNISOLONE ACETATE 80 MG/ML IJ SUSP
120.0000 mg | Freq: Once | INTRAMUSCULAR | Status: AC
Start: 2023-02-10 — End: 2023-02-10
  Administered 2023-02-10: 120 mg via INTRAMUSCULAR

## 2023-02-10 NOTE — Patient Instructions (Addendum)
Increase the gabapentin (100 x 2) three x daily(600 total)  until use up and if tolerated then start 300 mg  twice daily (600 mg total)   Depomedrol 120 mg IM   Plan A = Automatic = Always=    Formoterol 20 mcg/ budesonide 0.50 twice daily   Plan B = Backup (to supplement plan A, not to replace it) Only use your albuterol nebulizer  as a rescue medication to be used if you can't catch your breath by resting or doing a relaxed purse lip breathing pattern.  - The less you use it, the better it will work when you need it. - Ok to use  nebulizer   to every 4 hours if you must but call for appointment if use goes up over your usual need - Don't leave home without it !!  (think of it like the spare tire for your car)    Please schedule a follow up visit in 6 months but call sooner if needed

## 2023-02-10 NOTE — Progress Notes (Unsigned)
[name], female    DOB: 1945-11-14      MRN: [id number]   Brief patient profile:  77   yowf never smoker with a h/o sob related to valvular heart dz    Seen prior to Epic in [place]: Feb 20, 2009 initial pulmonary eval started aerobics 3 months prior to OV  X 30 min weekly and tolerated well with improving ex tol until 3 weeks prior to OV   noted decrease tolerance and since then gradually worse to point where walking to mailbox slt uphill or climbing steps has to stop at top.  In retrospect coughing for 3 months dry, worse immediately on lying down. due to the cough lisnopril was stopped and could not tolerate the substitute and cough hasn't changed but breathing got worse. prednisone no help so far and proaire no benefit. rec GERD diet   continue prilosec and reglan stop prednisone and proaire  avoid salt  maxzide 25 one each am and Dr Izell  office   10/16/12 Meth challenge  + for hyperreactive airways disease  clance eval 05/14/13 [name] that at this point is clearly upper airway in origin.  [name], but her current cough failed to respond to prednisone tapers.  Therefore it is unlikely that it is related to cough variant asthma.  She does not have chronic sinusitis by her CT scan, and there is nothing on chest x-ray earlier in the year to explain her cough.  She does have a history of breast cancer, but there has been no evidence for recurrence.  [name], but this has an extremely low yield.  [name], and therefore I am concerned she may be having laryngopharyngeal reflux as a cause for her cough.  I also think she may have a cyclical mechanism as well she did have some response to tramadol, and we could consider a trial of gabapentin  AVR  07/18/13   04/2020 echo  1. Left ventricular ejection fraction, by estimation, is 60 to 65%. The   left ventricle has normal function. The left ventricle has no regional  wall motion abnormalities. Left ventricular diastolic parameters are  indeterminate.   2. Right ventricular systolic function is low normal. The right  ventricular size is normal. There is moderately elevated pulmonary artery  systolic pressure. The estimated right ventricular systolic pressure is  60.1 mmHg.   3. Left atrial size was mildly dilated.   4. The mitral valve has been repaired/replaced. No evidence of mitral  valve regurgitation. The mean mitral valve gradient is 5.0 mmHg. There is  a 25 mm Magna Ease bioprosthetic valve present in the mitral position.  Grossly normal function.   5. The aortic valve has been repaired/replaced. Aortic valve  regurgitation is not visualized. There is a 21 mm Magna Ease bioprosthetic  valve present in the aortic position. Aortic valve mean gradient measures  7.0 mmHg. Grossly normal function.   6. The inferior vena cava is normal in size with greater than 50%  respiratory variability, suggesting right atrial pressure of 3 mmHg.        History of Present Illness  06/23/2020  Pulmonary/ 1st office eval/ [name] / [place] [facility] / re establish re cough and sob on flovent 44 2bid  For cough variant asthma  Chief Complaint  Patient presents with   Consult    Patient  has a dry cough that has been going on for many years but worse over the last 6 months. Shortness of breath with exertion.   Dyspnea:  Walks dog x 30 min / mostly flat uses cane to balance/ steps ok slowly due to breathing problems x sev years prior to OV   Cough: worse in afternoons and sometimes hs has to sit up 30 degrees to prevent cough supine  Sleep: ok as long as head is up 30 degrees new x 2 years  SABA use: sev times a day seems to help at 4 pm rec GERD diet  Pantoprazole (protonix) 40 mg   Take  30-60 min before first meal of the day and Pepcid (famotidine)  20 mg one after supper until return to  office - this is the best way to tell whether stomach acid is contributing to your problem.   Plan A = Automatic = Always=    flovent Take 2 puffs first thing in am and then another 2 puffs about 12 hours later.  Work on inhaler technique:  Plan B = Backup (to supplement plan A, not to replace it) Only use your albuterol inhaler as a rescue medication   Please schedule a follow up office visit in 6 weeks, call sooner if needed - bring inhalers  Next step ? Gabapentin?   08/05/2020  f/u ov/[place] office/[name] re: cough > sob x years  Chief Complaint  Patient presents with   Follow-up    non productive cough   Dyspnea:  Not limited by breathing from desired activities  / plays golf/ walks dog  Cough: dry ? Worse with activity (husband says not reproducible ?  worse p supper  Sleeping: most nights waking up  SABA use: once a day  02: none  rec Depomedrol 120 mg IM  Plan A = Automatic = Always=    symbicort 80 Take 2 puffs first thing in am and then another 2 puffs about 12 hours later.  Work on inhaler technique:     Plan B = Backup (to supplement plan A, not to replace it) Only use your albuterol inhaler as a rescue medication   Please schedule a follow up office visit in 6 weeks, call sooner if needed  - bring inhalers and the empty cannister of symbicort    09/16/2020  f/u ov/[place] office/[name] re: cough  x years ? Decades "nothing has ever helped" no better p depo last ov, not ever transiently  Chief Complaint  Patient presents with   Follow-up    Cough is unchanged since the last visit- prod with white sputum.  She is using her albuterol inhaler once per day usually around 4-5 pm.   Dyspnea:  Walks dog, uses cane / some balance issues limiting more than sob and still plays golf Cough: worse  p supper despite pepcid Sleeping: does better in recliner 30 degrees  SABA use: once a day around 5pm seems to help though note hfa very poor baseline  02: none Work on inhaler  technique:   Gabapentin 100 mg twice a day for a week then 4 x daily  Please remember to go to the  x-ray department  @  [facility] for your tests - we will call you with the results when they are available    Add Needs f/u cxr  ? rml syndrome p rx mucinex dm > then ct chest/ sinus if not better  and check allergy profile on return  10/29/2020  f/u ov/[place] office/[name] re: chronic cough  Chief Complaint  Patient presents with   Followup     Cough is unchanged since the last visit. She has used her albuterol inhaler twice since her last visit.   Dyspnea:  Slowed more balance than breathing, walks with suction cup cane Cough: dry cough day > noct Sleeping: able to sleep sitting up 30 degrees s sob or cough  SABA use: rare 02: none  Rec We will call to schedule you CT sinus (neg) and chest = ? Scarring from Breast RT (note surgery/RT in early 90's and cough started p that      07/22/2021  f/u ov/[name] re: cough > sob    maint on flovent  for dx of MCT pos cough variant asthma / stopped gabapentin ? Why ? Chief Complaint  Patient presents with   Follow-up    Productive cough with thick white mucus   Dyspnea:  mailbox and back is 200 yards slow pace Cough: daytime cough / lots of throat clearing  Sleeping: 30 degrees ok  SABA use: using saba bid/ poor flovent use  02: none  Covid status:   vax x 5  Rec Pantoprazole (protonix) 40 mg   Take  30-60 min before first meal of the day and Pepcid (famotidine)  20 mg after supper until return to office   Gabapentin 100 mg four times daily  Plan A = Automatic = Always=   Symbicort 80 mg  Take 2 puffs first thing in am and then another 2 puffs about 12 hours later. (Stop flovent and atrovent- unless can't afford symbicort on your plan) Plan B = Backup (to supplement plan A, not to replace it) Only use your albuterol inhaler as a rescue medication   Plan C = Crisis (instead of Plan B but only if Plan B stops working) - only  use your albuterol nebulizer if you first try Plan B   For cough > mucinex dm 1200 every 12 hours as needed      01/21/2022  f/u ov/[place] office/[name] re: uacs vs cough variant maint on alb/bud 0.25 mg bid   Chief Complaint  Patient presents with   Follow-up    Cough sob same since last ov    Dyspnea:  still walking dog and does better  if times it after am  albuterol  Cough: dry worse with allergies rx zyrtec works [employer]: 30 degrees hob  SABA use: neb twice daily with budesonide, no need for prn 02: none  Rec No change in medications  When symptoms flare go ahead and take the whole tube of budesonide = 0.5 mg until better then back to a half a tube   Admit date: 03/31/2022 Discharge date: 04/03/2022  Brief/Interim Summary: AUBRY RANKIN is a 77 y.o. female with medical history significant for COPD and asthma, atrial fibrillation, stroke, aortic and mitral valve replacement, on chronic anticoagulation, diabetes mellitus. Patient presented to the ED with reports of difficulty breathing of 2 days duration.  She was admitted with acute hypoxemic respiratory failure in the setting of volume overload.  Pneumonia was initially suspected, but was ruled out and antibiotics were discontinued.  2D echocardiogram with preserved LVEF and suspected diastolic dysfunction noted.  She has diuresed quite well and is under her baseline weight of 173 pounds to 165 pounds at this time.  She has diuresed nearly 2 L of fluid and has no further oxygen requirements or significant edema. REDS Clip  reading 36%.  She is eager to go home and will have her Lasix dose increased to 40 mg daily as opposed to as needed dosing previously.  She continues to have mild hypokalemia for which her potassium supplementation has been increased as well.  No other acute events noted during the course of the stay.   Discharge Diagnoses:  Principal Problem:   Acute respiratory failure with hypoxia (HCC)   Acute  congestive heart failure (HCC)   Pneumonia   Essential hypertension   Chronic anticoagulation   Cardioembolic stroke [facility])   Atrial fibrillation (HCC)   Status post aortic valve and mitral valve replacement   Type 2 diabetes mellitus treated without insulin (HCC)   Principal discharge diagnosis: Acute hypoxemic respiratory failure likely secondary to volume overload with acute on chronic diastolic CHF exacerbation.  04/20/2022  f/u ov/[place] office/[name] re: cough x 2010  maint on gerd rx/ alb neb bid with bud 0.5 mg bid   Chief Complaint  Patient presents with   Follow-up    SOB has not improved. Cough is getting better. Patient had a hospital admission 6/29-7/2 at [place]   Dyspnea:  only walking dog x 15 min then quits due to sob  Cough: better now  Sleeping: 30 degrees hob  SABA use: bid neb only / usually twice daily   02: none  Rec No change rx     07/25/2022  f/u ov/[place] office/[name] re: cough maint on alb/bd bid  / worse with dry cough  Chief Complaint  Patient presents with   Follow-up    Cough was improving until pt got sick a few weeks ago   Dyspnea:  no change  Cough: dry  - still  thick white p uri / husband had same illness 1st of Oct 2023 Sleeping: 30 degrees HOB  SABA use: hasn't needed saba  02: none   Rec Cough/ congestion : mucinex dm up 1200 mg every 12 hours as needed       02/10/2023  f/u ov/ office/[name] re: *** maint on ***  Chief Complaint  Patient presents with   Follow-up    SOB with exertion, prod cough with thick white sputum and wheezing.  Dyspnea:  stop sign and back 400 meters round trip  Cough: worse lately with wheezing  Sleeping: 30 degrees hob/ no resp cc  SABA use: neb doesn't help  02: none      No obvious day to day or daytime variability or assoc excess/ purulent sputum or mucus plugs or hemoptysis or cp or chest tightness, subjective wheeze or overt sinus or hb symptoms.   *** without nocturnal  or  early am exacerbation  of respiratory  c/o's or need for noct saba. Also denies any obvious fluctuation of symptoms with weather or environmental changes or other aggravating or alleviating factors except as outlined above   No unusual exposure hx or h/o childhood pna/ asthma or knowledge of premature birth.  Current Allergies, Complete Past Medical History, Past Surgical History, Family History, and Social History were reviewed in [employer] record.  ROS  The following are not active complaints unless bolded Hoarseness, sore throat, dysphagia, dental problems, itching, sneezing,  nasal congestion or discharge of excess mucus or purulent secretions, ear ache,   fever, chills, sweats, unintended wt loss or wt gain, classically pleuritic or exertional cp,  orthopnea pnd or arm/hand swelling  or leg swelling, presyncope, palpitations, abdominal pain, anorexia, nausea, vomiting, diarrhea  or change in  bowel habits or change in bladder habits, change in stools or change in urine, dysuria, hematuria,  rash, arthralgias, visual complaints, headache, numbness, weakness or ataxia or problems with walking or coordination,  change in mood or  memory.        Current Meds  Medication Sig   albuterol (PROVENTIL) (2.5 MG/3ML) 0.083% nebulizer solution USE 1 VIAL IN NEBULIZER EVERY4 HOURS AS NEEDED.   albuterol (VENTOLIN HFA) 108 (90 Base) MCG/ACT inhaler Inhale 1-2 puffs into the lungs every 6 (six) hours as needed for wheezing or shortness of breath.   atorvastatin (LIPITOR) 40 MG tablet TAKE ONE TABLET BY MOUTH ONCE DAILY.   budesonide (PULMICORT) 0.5 MG/2ML nebulizer solution USE 1/2 VIAL IN NEBULIZER TWICE DAILY. DISCARD UNUSED PORTION AFTER EACH USE. (Patient taking differently: Take 0.25 mg by nebulization 2 (two) times daily. USE 1/2 VIAL IN NEBULIZER TWICE DAILY. DISCARD UNUSED PORTION AFTER EACH USE.)   dextromethorphan-guaiFENesin (MUCINEX DM) 30-600 MG 12hr tablet Take 1 tablet by  mouth 2 (two) times daily as needed for cough.   escitalopram (LEXAPRO) 10 MG tablet Take 10 mg by mouth daily with breakfast.   famotidine (PEPCID) 20 MG tablet TAKE 1 TABLET AFTER SUPPER   fexofenadine (ALLEGRA) 180 MG tablet Take 180 mg by mouth in the morning.   fluticasone (FLONASE) 50 MCG/ACT nasal spray Place 1 spray into both nostrils daily.   gabapentin (NEURONTIN) 100 MG capsule TAKE 1 CAPSULE BY MOUTH FOUR TIMES DAILY.   levothyroxine (SYNTHROID) 100 MCG tablet Take 100 mcg by mouth daily before breakfast.   metFORMIN (GLUCOPHAGE) 500 MG tablet Take 1 tablet (500 mg total) by mouth 2 (two) times daily with a meal.   metoprolol tartrate (LOPRESSOR) 100 MG tablet Take 1 tablet (100 mg total) by mouth 2 (two) times daily.   Multiple Vitamin (MULITIVITAMIN WITH MINERALS) TABS Take 1 tablet by mouth daily.   pantoprazole (PROTONIX) 40 MG tablet TAKE 1 TABLET 30 TO 60 MINUTES BEFORE FIRST MEAL OF THE DAY. (Patient taking differently: Take 40 mg by mouth daily before breakfast.)   Polyethyl Glycol-Propyl Glycol (LUBRICANT EYE DROPS) 0.4-0.3 % SOLN Place 1-2 drops into both eyes 3 (three) times daily as needed (dry/irritated eyes.).   potassium chloride SA (KLOR-CON M) 20 MEQ tablet Take 1 tablet (20 mEq total) by mouth daily.   torsemide (DEMADEX) 20 MG tablet Take 1 tablet (20 mg total) by mouth daily.   warfarin (COUMADIN) 2.5 MG tablet TAKE ONE TO 1.5 TABLETS BY MOUTH ONCE DAILY AS DIRECTED BY COUMADIN CLINIC. (Patient taking differently: Take 2.5-3.75 mg by mouth See admin instructions. Take 1.5 tablets (3.75 mg) by mouth twice weekly on Sundays & Thursdays in the evening. Take 1 tablet (2.5 mg) by mouth on Mondays, Tuesdays, Wednesdays, Fridays & Saturdays in the evening.)                Past Medical History:  Diagnosis Date   Anemia    Atrial fibrillation (HCC)    Breast carcinoma (HCC) 1995   1995   Cardioembolic stroke (HCC) 01/2012   Right frontal in 01/2012; normal carotid  ultrasound; possible LAA thrombus by TEE; virtual complete neurologic recovery   Chronic kidney disease, stage 2, mildly decreased GFR    GFR of approximately 60   Depression    Diverticulosis of colon (without mention of hemorrhage) 2012   Dr. Karilyn Cota   Fasting hyperglycemia    120 fasting   Gastroesophageal reflux disease    Gastroparesis  Hemorrhoids    Hyperlipidemia    Hypertension    pt denies 05/30/13     Dr Hinton Rao   Hyponatremia    Rheumatic heart disease    a. 07/2013 Echo: Ef 55-60%, Mod AS/AI, Mod-Sev MS, sev dil LA, PASP 58;  b. 07/2013 TEE: EF 35-40%, mild-mod AS/AI, mod-sev MS, LA smoke;  c. 07/2013 Cath: elev R heart pressures, nl cors.   Shortness of breath     Past Medical History:    Hx of NEOPLASM, MALIGNANT, BREAST (ICD-174.9)    HYPERLIPIDEMIA (ICD-272.4)    ASTHMA (ICD-493.90)    GERD with gastroparesis   Past Surgical History:    Breast surgery 1995    Heart valve surgery 1962 age 80 [facility] Mitral valve     Tubal ligation-1973   Objective:     Wt  02/10/2023        ***  07/25/2022      167 04/20/2022        170  01/21/2022        177  12/13/2021        177 09/14/2021      170  07/22/2021      162  10/29/2020        177 09/16/2020      178   08/05/20 178 lb 12.8 oz (81.1 kg)  06/25/20 194 lb (88 kg)  06/23/20 180 lb (81.6 kg)     Vital signs reviewed  02/10/2023  - Note at rest 02 sats  ***% on ***   General appearance:    ***      2/6 SEM ***           Assessment

## 2023-02-12 ENCOUNTER — Telehealth: Payer: Self-pay | Admitting: Internal Medicine

## 2023-02-12 NOTE — Telephone Encounter (Signed)
Be sure husband understands that budesonide needs to be the full 0.5 mg bid at this point

## 2023-02-12 NOTE — Assessment & Plan Note (Addendum)
Original onset 2010 on ACEi  Spiro 09/2012: normal  Sinus ct 2014:  No chronic sinus disease. 10/16/12 Meth challenge  + for hyperreactive airways disease 04/30/20 CT head sinus nl - 06/23/2020  After extensive coaching inhaler device,  effectiveness =    75% from a baseline of 25% > continue flovent 44 2bid and prn saba plus max gerd rx  08/05/2020    changed to symb 80 2bid and given depomedrol 120 mg IM  > no better - 09/16/2020  After extensive coaching inhaler device,  effectiveness =    50% baseline of less than 25% - 09/16/2020 started gabapentin titrate up to 100 mg qid  -  11/26/20  Sinus CT neg  - Allergy profile 07/22/2021 >  Eos 0.2 /  IgE  15 - 09/14/2021  After extensive coaching inhaler device,  effectiveness =    0% so changed to alb/bud bid > 12/13/2021 did not follow instructions so try again same rx  With  Prednisone 10 mg take  4 each am x 2 days,   2 each am x 2 days,  1 each am x 2 days and stop and bud 0.25 mg bid combined with albuterol since can't afford performist on her plan. - FENO 02/10/2023  =29  on bud 0.25 mg bid > added formoterol 20 mcg bid and increased  budesonide 0.5 mg bid  - 02/10/2023 try increase gabapentin to 300 mg bid if tolerates for the UACS component   Overall doing better though reports wheezing and FENO up on bud 0.25 bid so rec trial of 0.5 mg bid and see if she can get formoterol either thru hmo or Plan B medicare at affordable rate.     Each maintenance medication was reviewed in detail including emphasizing most importantly the difference between maintenance and prns and under what circumstances the prns are to be triggered using an action plan format where appropriate.  Total time for H and P, chart review, counseling, reviewing neb  device(s) and generating customized AVS unique to this office visit / same day charting = 24 min

## 2023-02-13 NOTE — Telephone Encounter (Signed)
Called and spoke w/ pt husband he verbalized that he does understanding the neb directions. NFN att.

## 2023-02-14 ENCOUNTER — Ambulatory Visit: Payer: Medicare PPO | Attending: Cardiology | Admitting: *Deleted

## 2023-02-14 DIAGNOSIS — Z5181 Encounter for therapeutic drug level monitoring: Secondary | ICD-10-CM | POA: Diagnosis not present

## 2023-02-14 DIAGNOSIS — Z952 Presence of prosthetic heart valve: Secondary | ICD-10-CM | POA: Diagnosis not present

## 2023-02-14 LAB — POCT INR: INR: 2.2 (ref 2.0–3.0)

## 2023-02-14 NOTE — Patient Instructions (Signed)
S/P cardioversion on 01/04/23. Take warfarin 2 tablets today the resume 1 tablet daily Recheck INR in 3 weeks

## 2023-03-07 ENCOUNTER — Ambulatory Visit: Payer: Medicare PPO | Attending: Cardiology | Admitting: *Deleted

## 2023-03-07 DIAGNOSIS — Z952 Presence of prosthetic heart valve: Secondary | ICD-10-CM | POA: Diagnosis not present

## 2023-03-07 DIAGNOSIS — Z5181 Encounter for therapeutic drug level monitoring: Secondary | ICD-10-CM | POA: Diagnosis not present

## 2023-03-07 LAB — POCT INR: INR: 1.7 — AB (ref 2.0–3.0)

## 2023-03-07 NOTE — Patient Instructions (Signed)
S/P cardioversion on 01/04/23. Take warfarin 2 tablets today then increase dose to 1 tablet daily except 1 1/2 tablets on Tuesdays and Saturdays Recheck INR in 2 weeks

## 2023-03-14 ENCOUNTER — Other Ambulatory Visit: Payer: Self-pay | Admitting: Internal Medicine

## 2023-03-14 DIAGNOSIS — R0609 Other forms of dyspnea: Secondary | ICD-10-CM

## 2023-03-21 ENCOUNTER — Ambulatory Visit: Payer: Medicare PPO | Attending: Cardiology | Admitting: *Deleted

## 2023-03-21 DIAGNOSIS — Z952 Presence of prosthetic heart valve: Secondary | ICD-10-CM | POA: Diagnosis not present

## 2023-03-21 DIAGNOSIS — Z5181 Encounter for therapeutic drug level monitoring: Secondary | ICD-10-CM

## 2023-03-21 LAB — POCT INR: INR: 2 (ref 2.0–3.0)

## 2023-03-21 NOTE — Patient Instructions (Signed)
S/P cardioversion on 01/04/23. Take warfarin 2 tablets today then increase dose to 1 1/2 tablets daily except 1 tablet on Mondays, Wednesdays and Fridays Recheck INR in 2 weeks

## 2023-04-03 ENCOUNTER — Ambulatory Visit: Payer: Medicare PPO | Attending: Cardiology | Admitting: *Deleted

## 2023-04-03 DIAGNOSIS — Z952 Presence of prosthetic heart valve: Secondary | ICD-10-CM | POA: Diagnosis not present

## 2023-04-03 DIAGNOSIS — Z5181 Encounter for therapeutic drug level monitoring: Secondary | ICD-10-CM

## 2023-04-03 LAB — POCT INR: INR: 2.5 (ref 2.0–3.0)

## 2023-04-03 NOTE — Patient Instructions (Signed)
Continue warfarin 1 1/2 tablets daily except 1 tablet on Mondays, Wednesdays and Fridays Recheck INR in 4 weeks

## 2023-04-10 DIAGNOSIS — E039 Hypothyroidism, unspecified: Secondary | ICD-10-CM | POA: Diagnosis not present

## 2023-04-10 DIAGNOSIS — E785 Hyperlipidemia, unspecified: Secondary | ICD-10-CM | POA: Diagnosis not present

## 2023-04-10 DIAGNOSIS — I700 Atherosclerosis of aorta: Secondary | ICD-10-CM | POA: Diagnosis not present

## 2023-04-10 DIAGNOSIS — E1129 Type 2 diabetes mellitus with other diabetic kidney complication: Secondary | ICD-10-CM | POA: Diagnosis not present

## 2023-04-10 DIAGNOSIS — Z79899 Other long term (current) drug therapy: Secondary | ICD-10-CM | POA: Diagnosis not present

## 2023-04-10 DIAGNOSIS — C50919 Malignant neoplasm of unspecified site of unspecified female breast: Secondary | ICD-10-CM | POA: Diagnosis not present

## 2023-04-17 ENCOUNTER — Other Ambulatory Visit: Payer: Self-pay | Admitting: Internal Medicine

## 2023-04-17 DIAGNOSIS — E1122 Type 2 diabetes mellitus with diabetic chronic kidney disease: Secondary | ICD-10-CM | POA: Diagnosis not present

## 2023-04-17 DIAGNOSIS — N1831 Chronic kidney disease, stage 3a: Secondary | ICD-10-CM | POA: Diagnosis not present

## 2023-04-17 DIAGNOSIS — R0609 Other forms of dyspnea: Secondary | ICD-10-CM

## 2023-04-17 DIAGNOSIS — I5032 Chronic diastolic (congestive) heart failure: Secondary | ICD-10-CM | POA: Diagnosis not present

## 2023-04-17 DIAGNOSIS — R7309 Other abnormal glucose: Secondary | ICD-10-CM | POA: Diagnosis not present

## 2023-04-18 ENCOUNTER — Encounter: Payer: Self-pay | Admitting: Internal Medicine

## 2023-04-21 ENCOUNTER — Encounter: Payer: Self-pay | Admitting: Cardiovascular Disease

## 2023-04-21 ENCOUNTER — Ambulatory Visit: Payer: Medicare PPO | Attending: Cardiovascular Disease | Admitting: Cardiovascular Disease

## 2023-04-21 VITALS — BP 140/62 | HR 67 | Ht 62.5 in | Wt 164.0 lb

## 2023-04-21 DIAGNOSIS — Z5181 Encounter for therapeutic drug level monitoring: Secondary | ICD-10-CM

## 2023-04-21 DIAGNOSIS — I4891 Unspecified atrial fibrillation: Secondary | ICD-10-CM

## 2023-04-21 NOTE — Progress Notes (Signed)
Electrophysiology Office Note:    Date:  04/21/2023   ID:  CAMERON KATAYAMA, DOB 1946-09-16, MRN [id number]  PCP:  Carylon Perches, MD   [facility] Providers Cardiologist:  Dina Rich, MD Electrophysiologist:  Maurice Small, MD     Referring MD: Carylon Perches, MD   History of Present Illness:    [name] is a 77 y.o. female with a hx listed below, significant for atrial fibrillation and flutter, cardioembolic stroke, diabetes, hypertension, rheumatic heart disease status post replacement of both aortic and mitral valves with bioprosthetic devices replacement referred for arrhythmia management.     She is on Coumadin for prior stroke in the setting of rheumatic heart disease.  She was noted to have a possible left atrial thrombus at that time.  She was admitted in February 20 2:24 episodes of syncope.  She was noted to have atrial flutter with 4-1 block.  Syncopal episodes were preceded by shoulder pain, which was thought to be trigger for vasovagal episodes.  She was also noted to be bradycardic with 4:1 atrial flutter.  Prior to admission she was taking diltiazem 180 mg and metoprolol 37.5.  These medications were held.  Diltiazem was discontinued on discharge, but she remained on metoprolol 37.5 a ZIO monitor was subsequently placed that showed 100% burden of atrial flutter and an average heart rate of 105 bpm.  She has ongoing fatigue, shortness of breath.  She has a pulse ox monitor that reliably measured heart rates at about 110 bpm since discharge. No chest pain, no recurrence of syncope.      Since our last visit, she underwent cardioversion. She has been maintaining sinus rhythm and doing very well.  EKGs/Labs/Other Studies Reviewed Today:    Echocardiogram:  TTE 11/12/2022  1. Left ventricular ejection fraction, by estimation, is 55 to 60%. The  left ventricle has normal function. The left ventricle has no regional  wall motion abnormalities. There is  mild left ventricular hypertrophy.  Indeterminate diastolic filling due to  E-A fusion.   2. Right ventricular systolic function was not well visualized. The right  ventricular size is normal. There is moderately elevated pulmonary artery  systolic pressure. The estimated right ventricular systolic pressure is  54.3 mmHg.   3. Left atrial size was mildly dilated.   4. The mitral valve has been repaired/replaced. No evidence of mitral  valve regurgitation. No evidence of mitral stenosis. The mean mitral valve  gradient is 7.0 mmHg at HR of 105 bpm. There is a 25 mm Magna Ease  bioprosthetic valve present in the mitral  position. Procedure Date: 2014. Grossly normal function.   5. The aortic valve has been repaired/replaced. Aortic valve  regurgitation is not visualized. No aortic stenosis is present. There is a  21 mm Magna Ease valve present in the aortic position. Procedure Date:  2014. Aortic valve mean gradient measures 10.0  mmHg. Aortic valve Vmax measures 2.13 m/s. Grossly normal function.   6. The inferior vena cava is normal in size with greater than 50%  respiratory variability, suggesting right atrial pressure of 3 mmHg.   Monitors:  Zio Patch 11/2022 14 days Atrial flutter, 100% burden,  HR 55-150, avg 106 No patient-triggered events  Advanced imaging:   EKG:  Last EKG results: today -- atypical atrial flutter with 2:1 conduction, V-rate 116 bpm   Recent Labs: 11/11/2022: ALT 22; B Natriuretic Peptide 137.0; TSH 4.932 11/12/2022: Magnesium 1.7 01/02/2023: BUN 15; Creatinine, Ser 1.20; Hemoglobin 13.7;  Platelets 198; Potassium 3.8; Sodium 136     Physical Exam:    VS:  BP (!) 140/62   Pulse 67   Ht 5' 2.5" (1.588 m)   Wt 164 lb (74.4 kg)   SpO2 95%   BMI 29.52 kg/m     Wt Readings from Last 3 Encounters:  04/21/23 164 lb (74.4 kg)  02/10/23 158 lb 12.8 oz (72 kg)  02/09/23 164 lb 12.8 oz (74.8 kg)     GEN: Well nourished, well developed in no acute  distress CARDIAC: regular, tachy rhythm, no murmurs, rubs, gallops RESPIRATORY:  Normal work of breathing MUSCULOSKELETAL: trace edema    ASSESSMENT & PLAN:    Atrial flutter - longstanding persistent, likely atypical - it appears that she has been in atrial flutter for the past 3 years prior to DCCV 01/04/23 - atrial size is only mildly dilated - Maintaining sinus rhythm - continue metoprolol 100 mg  - If she has recurrence, we could consider amiodarone though she has had a rash on this in the past  Secondary hypercoagulable state - on warfarin for valvular AF  Syncope - situational syncope, triggered by shoulder pain - orthostatic during recent admission  Hypertension -Losartan discontinued due to hypotension, BP elevated today -continue to monitor        Medication Adjustments/Labs and Tests Ordered: Current medicines are reviewed at length with the patient today.  Concerns regarding medicines are outlined above.  Orders Placed This Encounter  Procedures   EKG 12-Lead   No orders of the defined types were placed in this encounter.    Signed, Maurice Small, MD  04/21/2023 3:53 PM    [facility]

## 2023-04-21 NOTE — Patient Instructions (Signed)
Medication Instructions:  Your physician recommends that you continue on your current medications as directed. Please refer to the Current Medication list given to you today. *If you need a refill on your cardiac medications before your next appointment, please call your pharmacy*   Follow-Up: At [facility], you and your health needs are our priority.  As part of our continuing mission to provide you with exceptional heart care, we have created designated Provider Care Teams.  These Care Teams include your primary Cardiologist (physician) and Advanced Practice Providers (APPs -  Physician Assistants and Nurse Practitioners) who all work together to provide you with the care you need, when you need it.  We recommend signing up for the patient portal called "MyChart".  Sign up information is provided on this After Visit Summary.  MyChart is used to connect with patients for Virtual Visits (Telemedicine).  Patients are able to view lab/test results, encounter notes, upcoming appointments, etc.  Non-urgent messages can be sent to your provider as well.   To learn more about what you can do with MyChart, go to [email or web address].    Your next appointment:   1 year(s)  Provider:   Augustus Mealor, MD  

## 2023-04-26 ENCOUNTER — Other Ambulatory Visit: Payer: Self-pay | Admitting: Cardiology

## 2023-04-26 NOTE — Telephone Encounter (Signed)
Refill request for warfarin:  Last INR was 2.5 on 04/03/23 Next INR due 05/01/23 LOV was 04/21/23  Refill approved.

## 2023-05-01 ENCOUNTER — Ambulatory Visit: Payer: Medicare PPO | Attending: Cardiology | Admitting: *Deleted

## 2023-05-01 DIAGNOSIS — Z5181 Encounter for therapeutic drug level monitoring: Secondary | ICD-10-CM | POA: Diagnosis not present

## 2023-05-01 DIAGNOSIS — Z952 Presence of prosthetic heart valve: Secondary | ICD-10-CM

## 2023-05-01 LAB — POCT INR: INR: 3.5 — AB (ref 2.0–3.0)

## 2023-05-01 NOTE — Patient Instructions (Signed)
Continue warfarin 1 1/2 tablets daily except 1 tablet on Mondays, Wednesdays and Fridays Recheck INR in 4 weeks

## 2023-05-15 ENCOUNTER — Other Ambulatory Visit: Payer: Self-pay | Admitting: Internal Medicine

## 2023-05-29 ENCOUNTER — Ambulatory Visit: Payer: Medicare PPO | Attending: Cardiology | Admitting: *Deleted

## 2023-05-29 DIAGNOSIS — Z952 Presence of prosthetic heart valve: Secondary | ICD-10-CM

## 2023-05-29 DIAGNOSIS — Z5181 Encounter for therapeutic drug level monitoring: Secondary | ICD-10-CM | POA: Diagnosis not present

## 2023-05-29 LAB — POCT INR: INR: 4.4 — AB (ref 2.0–3.0)

## 2023-05-29 NOTE — Patient Instructions (Signed)
Hold warfarin tonight then decrease dose to 1 tablet daily except 1 1/2 tablets on Sundays and Thursdays Recheck INR in 2 weeks

## 2023-05-30 ENCOUNTER — Other Ambulatory Visit: Payer: Self-pay | Admitting: Internal Medicine

## 2023-05-30 DIAGNOSIS — R0609 Other forms of dyspnea: Secondary | ICD-10-CM

## 2023-06-06 DIAGNOSIS — S8012XA Contusion of left lower leg, initial encounter: Secondary | ICD-10-CM | POA: Diagnosis not present

## 2023-06-06 DIAGNOSIS — Z6830 Body mass index (BMI) 30.0-30.9, adult: Secondary | ICD-10-CM | POA: Diagnosis not present

## 2023-06-06 DIAGNOSIS — E669 Obesity, unspecified: Secondary | ICD-10-CM | POA: Diagnosis not present

## 2023-06-09 ENCOUNTER — Encounter: Payer: Medicare PPO | Admitting: Cardiovascular Disease

## 2023-06-13 ENCOUNTER — Ambulatory Visit: Payer: Medicare PPO | Attending: Cardiology | Admitting: *Deleted

## 2023-06-13 DIAGNOSIS — Z5181 Encounter for therapeutic drug level monitoring: Secondary | ICD-10-CM

## 2023-06-13 DIAGNOSIS — Z952 Presence of prosthetic heart valve: Secondary | ICD-10-CM | POA: Diagnosis not present

## 2023-06-13 LAB — POCT INR: INR: 3.5 — AB (ref 2.0–3.0)

## 2023-06-13 NOTE — Patient Instructions (Signed)
Continue warfarin 1 tablet daily except 1 1/2 tablets on Sundays and Thursdays Recheck INR in 4 weeks

## 2023-07-10 ENCOUNTER — Encounter: Payer: Self-pay | Admitting: Cardiology

## 2023-07-10 ENCOUNTER — Ambulatory Visit: Payer: Medicare PPO | Attending: Cardiology | Admitting: Cardiology

## 2023-07-10 ENCOUNTER — Ambulatory Visit: Payer: Medicare PPO | Admitting: *Deleted

## 2023-07-10 VITALS — BP 119/73 | HR 66 | Ht 62.0 in | Wt 170.2 lb

## 2023-07-10 DIAGNOSIS — I4891 Unspecified atrial fibrillation: Secondary | ICD-10-CM | POA: Diagnosis not present

## 2023-07-10 DIAGNOSIS — Z5181 Encounter for therapeutic drug level monitoring: Secondary | ICD-10-CM

## 2023-07-10 DIAGNOSIS — I5032 Chronic diastolic (congestive) heart failure: Secondary | ICD-10-CM | POA: Diagnosis not present

## 2023-07-10 DIAGNOSIS — Z952 Presence of prosthetic heart valve: Secondary | ICD-10-CM | POA: Diagnosis not present

## 2023-07-10 LAB — POCT INR: INR: 3.4 — AB (ref 2.0–3.0)

## 2023-07-10 MED ORDER — TORSEMIDE 20 MG PO TABS: 20.0000 mg | ORAL_TABLET | Freq: Every day | ORAL | 6 refills | Status: DC

## 2023-07-10 NOTE — Patient Instructions (Signed)
Continue warfarin 1 tablet daily except 1 1/2 tablets on Sundays and Thursdays Recheck INR in 4 weeks

## 2023-07-10 NOTE — Progress Notes (Signed)
Clinical Summary [name] is a 77 y.o.female seen today for follow up of the following medical problems.  1. Valvular heart disease   - 07/18/13 patient underwent MVR with 25 mm Edwards Magna-ease pericardial valve and also AVR with 21 mm Edwards Magna-ease pericardial valve    -03/2022 echo: LVEF 60%, indet diastolic, normal MVR and AVR.   - no recent SOB, has chronic cough.  - mild LE edema at times - home weights 166-167 lbs.       2.SOB -admitted 03/2022 with SOB, hypoxia - from notes O2 sats down to 89% on room air.  She reports dyspnea and cough.  Portable chest x-ray suggesting right base atelectasis or developing infection, also loculated pleural fluid in right lateral lung slightly increased from prior.  Also possible component of congestive heart failure. - 03/2022 echo: LVEF 60%, indet diastolic, normal MVR and AVR.  - BNP 297     - intermittent SOB at home, sats will get down to mid 80s - reports takes additional lasix and symptoms and O2 sats improve     - last 09/02/22 visit we changed lasix to torsemide 40mg  bid.  - Cr uptrend from 0.8 to 1.18, BNP 118 - with diuretic change SOB mildly improved, now drop in O2 sats are fairly infrequent. She reports breathing has improved.        3. HTN - she is compliantw ith meds   4. Afib/aflutter - hx of cardioembolic CVA, has been on longterm anticoag -- no recent palpitations - no bleeding on coumadin    - has had some palpitations, 1-2 times per day. Lasts just a few seconds   - DCCV 01/04/23 - from Dr Morrie Sheldon notes if recurent aflutter consider amiodarone. -     4. Hyperlipidemia - recent labs with pcp, compliant with stin   06/2021 TC 185 TG 75 HDL 66 LDL 104 01/2022 TC 177 TG 133 HDL 58 LDL 96   5. Carotid stenosis - 07/2013 [place] with mild bilateral stenosis 2018 mild disease - 01/2021 [place] mild bilateral disease - 11/2022 mild bilateral disease        6. Obstructive lung disease 2014 PFTs moderate  obstruction   7. Syncope -admit 11/2022, from notes intense shoulder pain which triggered syncope suggesting vasovagal etiology. Had low bp and HRs -  Past Medical History:  Diagnosis Date   Anemia    Asthma    Atrial fibrillation (HCC)    Breast carcinoma (HCC) 1995   1995   Cardioembolic stroke (HCC) 01/2012   Right frontal in 01/2012; normal carotid ultrasound; possible LAA thrombus by TEE; virtual complete neurologic recovery   Chronic kidney disease, stage 2, mildly decreased GFR    GFR of approximately 60   Depression    Diabetes mellitus without complication (HCC)    Diverticulosis of colon (without mention of hemorrhage) 2012   Dr. Karilyn Cota   Fasting hyperglycemia    120 fasting   Gastroesophageal reflux disease    Gastroparesis    Hemorrhoids    Hyperlipidemia    Hypertension    pt denies 05/30/13     Dr Hinton Rao   Hyponatremia    Rheumatic heart disease    a. 07/2013 Echo: Ef 55-60%, Mod AS/AI, Mod-Sev MS, sev dil LA, PASP 58;  b. 07/2013 TEE: EF 35-40%, mild-mod AS/AI, mod-sev MS, LA smoke;  c. 07/2013 Cath: elev R heart pressures, nl cors.   Shortness of breath  Allergies  Allergen Reactions   Amiodarone Rash     Current Outpatient Medications  Medication Sig Dispense Refill   albuterol (PROVENTIL) (2.5 MG/3ML) 0.083% nebulizer solution USE 1 VIAL IN NEBULIZER EVERY4 HOURS AS NEEDED. 180 mL 5   atorvastatin (LIPITOR) 40 MG tablet TAKE ONE TABLET BY MOUTH ONCE DAILY. 90 tablet 3   budesonide (PULMICORT) 0.5 MG/2ML nebulizer solution USE 1/2 VIAL IN NEBULIZER TWICE DAILY. DISCARD UNUSED PORTION AFTER EACH USE. (Patient taking differently: Take 0.25 mg by nebulization 2 (two) times daily. USE 1/2 VIAL IN NEBULIZER TWICE DAILY. DISCARD UNUSED PORTION AFTER EACH USE.) 120 mL 3   dextromethorphan-guaiFENesin (MUCINEX DM) 30-600 MG 12hr tablet Take 1 tablet by mouth 2 (two) times daily as needed for cough.     escitalopram (LEXAPRO) 10 MG tablet Take 10 mg  by mouth daily with breakfast.     famotidine (PEPCID) 20 MG tablet TAKE 1 TABLET AFTER SUPPER 30 tablet 1   FARXIGA 10 MG TABS tablet Take 10 mg by mouth daily.     fexofenadine (ALLEGRA) 180 MG tablet Take 180 mg by mouth in the morning.     fluticasone (FLONASE) 50 MCG/ACT nasal spray Place 1 spray into both nostrils daily.     formoterol (PERFOROMIST) 20 MCG/2ML nebulizer solution Take 2 mLs (20 mcg total) by nebulization 2 (two) times daily. Use in nebulizer twice daily perfectly regularly 360 mL 11   gabapentin (NEURONTIN) 300 MG capsule Take 1 capsule (300 mg total) by mouth 2 (two) times daily. 60 capsule 11   levothyroxine (SYNTHROID) 100 MCG tablet Take 100 mcg by mouth daily before breakfast.     metFORMIN (GLUCOPHAGE) 500 MG tablet Take 1 tablet (500 mg total) by mouth 2 (two) times daily with a meal. 60 tablet 1   metoprolol tartrate (LOPRESSOR) 100 MG tablet Take 1 tablet (100 mg total) by mouth 2 (two) times daily. 60 tablet 6   Multiple Vitamin (MULITIVITAMIN WITH MINERALS) TABS Take 1 tablet by mouth daily.     pantoprazole (PROTONIX) 40 MG tablet TAKE 1 TABLET 30 TO 60 MINUTES BEFORE FIRST MEAL OF THE DAY. 90 tablet 1   Polyethyl Glycol-Propyl Glycol (LUBRICANT EYE DROPS) 0.4-0.3 % SOLN Place 1-2 drops into both eyes 3 (three) times daily as needed (dry/irritated eyes.).     potassium chloride SA (KLOR-CON M) 20 MEQ tablet Take 1 tablet (20 mEq total) by mouth daily. 30 tablet 0   torsemide (DEMADEX) 20 MG tablet Take 1 tablet (20 mg total) by mouth daily. 30 tablet 2   warfarin (COUMADIN) 2.5 MG tablet TAKE AS DIRECTED BY COUMADIN CLINIC. 45 tablet 5   No current facility-administered medications for this visit.     Past Surgical History:  Procedure Laterality Date   AORTIC VALVE REPLACEMENT N/A 07/18/2013   Procedure: AORTIC VALVE REPLACEMENT (AVR);  Surgeon: [name], MD;  Location: [facility] OR;  Service: Open Heart Surgery;  Laterality: N/A;   BACK SURGERY     BREAST  LUMPECTOMY Right 1995   CARDIAC CATHETERIZATION     CARDIOVERSION N/A 07/26/2013   Procedure: CARDIOVERSION;  Surgeon: Pricilla Riffle, MD;  Location: [facility] ENDOSCOPY;  Service: Cardiovascular;  Laterality: N/A;   CARDIOVERSION N/A 01/04/2023   Procedure: CARDIOVERSION;  Surgeon: Meriam Sprague, MD;  Location: [facility] ENDOSCOPY;  Service: Cardiovascular;  Laterality: N/A;   COLONOSCOPY  2012   Negative screening procedure   DILATION AND CURETTAGE OF UTERUS     ESOPHAGEAL MANOMETRY N/A 06/17/2013  Procedure: ESOPHAGEAL MANOMETRY (EM);  Surgeon: Mardella Layman, MD;  Location: [facility] ENDOSCOPY;  Service: Endoscopy;  Laterality: N/A;   INTRAOPERATIVE TRANSESOPHAGEAL ECHOCARDIOGRAM N/A 07/18/2013   Procedure: INTRAOPERATIVE TRANSESOPHAGEAL ECHOCARDIOGRAM;  Surgeon: [name], MD;  Location: [facility] OR;  Service: Open Heart Surgery;  Laterality: N/A;   LEFT AND RIGHT HEART CATHETERIZATION WITH CORONARY ANGIOGRAM N/A 07/10/2013   Procedure: LEFT AND RIGHT HEART CATHETERIZATION WITH CORONARY ANGIOGRAM;  Surgeon: Kathleene Hazel, MD;  Location: [facility] CATH LAB;  Service: Cardiovascular;  Laterality: N/A;   LUMBAR LAMINECTOMY/DECOMPRESSION MICRODISCECTOMY Right 05/13/2014   Procedure: LUMBAR LAMINECTOMY/DECOMPRESSION MICRODISCECTOMY 1 LEVEL  lumbar four/five;  Surgeon: Reinaldo Meeker, MD;  Location: [facility] NEURO ORS;  Service: Neurosurgery;  Laterality: Right;   MITRAL VALVE REPLACEMENT N/A 07/18/2013   Procedure: MITRAL VALVE (MV) REPLACEMENT;  Surgeon: [name], MD;  Location: [facility] OR;  Service: Open Heart Surgery;  Laterality: N/A;   MITRAL VALVE SURGERY  [facility], closed mitral valvulotomy by finger fracture   TEE WITHOUT CARDIOVERSION  01/24/2012   Procedure: TRANSESOPHAGEAL ECHOCARDIOGRAM (TEE);  Surgeon: Lewayne Bunting, MD;  Location: [facility] ENDOSCOPY;  Service: Cardiovascular;  Laterality: N/A;   TEE WITHOUT CARDIOVERSION N/A 07/11/2013   Procedure: TRANSESOPHAGEAL ECHOCARDIOGRAM (TEE);  Surgeon:  Vesta Mixer, MD;  Location: [facility] ENDOSCOPY;  Service: Cardiovascular;  Laterality: N/A;   TEE WITHOUT CARDIOVERSION N/A 07/26/2013   Procedure: TRANSESOPHAGEAL ECHOCARDIOGRAM (TEE);  Surgeon: Pricilla Riffle, MD;  Location: [facility] ENDOSCOPY;  Service: Cardiovascular;  Laterality: N/A;   TUBAL LIGATION  1973     Allergies  Allergen Reactions   Amiodarone Rash      Family History  Problem Relation Age of Onset   Heart disease Brother 60       MI   Rheum arthritis Maternal Grandmother    Asthma Maternal Grandfather    Hypothyroidism Mother    Diabetes Sister    Cirrhosis Father      Social History [name] reports that she has never smoked. She has never used smokeless tobacco. [name] reports no history of alcohol use.   Review of Systems CONSTITUTIONAL: No weight loss, fever, chills, weakness or fatigue.  HEENT: Eyes: No visual loss, blurred vision, double vision or yellow sclerae.No hearing loss, sneezing, congestion, runny nose or sore throat.  SKIN: No rash or itching.  CARDIOVASCULAR: per hpi RESPIRATORY: per hpi GASTROINTESTINAL: No anorexia, nausea, vomiting or diarrhea. No abdominal pain or blood.  GENITOURINARY: No burning on urination, no polyuria NEUROLOGICAL: No headache, dizziness, syncope, paralysis, ataxia, numbness or tingling in the extremities. No change in bowel or bladder control.  MUSCULOSKELETAL: No muscle, back pain, joint pain or stiffness.  LYMPHATICS: No enlarged nodes. No history of splenectomy.  PSYCHIATRIC: No history of depression or anxiety.  ENDOCRINOLOGIC: No reports of sweating, cold or heat intolerance. No polyuria or polydipsia.  Marland Kitchen   Physical Examination Today's Vitals   07/10/23 1541  BP: 119/73  Pulse: 66  SpO2: 96%  Weight: 170 lb 3.2 oz (77.2 kg)  Height: 5\' 2"  (1.575 m)   Body mass index is 31.13 kg/m.  Gen: resting comfortably, no acute distress HEENT: no scleral icterus, pupils equal round and reactive, no  palptable cervical adenopathy,  CV: RRR Resp: Clear to auscultation bilaterally GI: abdomen is soft, non-tender, non-distended, normal bowel sounds, no hepatosplenomegaly MSK: extremities are warm, no edema.  Skin: warm, no rash Neuro:  no focal deficits Psych: appropriate affect   Diagnostic Studies 07/2013 TTE  Study Conclusions  - Study data: Technically difficult study. - Left ventricle: The cavity size was normal. Wall thickness   was normal. Systolic function was normal. The estimated   ejection fraction was in the range of 55% to 60%.   Indeterminate diastolic function. There is evidence of   very high left atrial pressures (E/e' 63) - Aortic valve: Trileaflet; mildly thickened leaflets.   Accurate evaluation of aortic stenosis limited in the   setting of severe mitral stenosis and decreased   ventricular loadin. Morphologically there does not appear   to be significant stenosis. By gradient (mean PG 21 mmHg)   the stenosis is moderate, by AVA VTI (0.8) stenosis is   severe. Moderate regurgitation, AR PHT is 439 cm/s. Valve   area: 0.78cm^2(VTI). Valve area: 0.78cm^2 (Vmax). - Mitral valve: The findings are consistent with moderate to   severe stenosis. (Mean PG 12 mmHg, MVA by PHT 1.3. Cannot   use MVA by VTI due to moderate AI. Valve area by pressure   half-time: 1.38cm^2. - Left atrium: The atrium was severely dilated. - Right ventricle: Systolic function was mildly reduced, RV   TAPSE is 1.4, TV systolic annular tissue velocity 10 cm/s. - Pulmonary arteries: Systolic pressure was moderately   increased. PA peak pressure: 58mm Hg (S) (PASP based on a   fairly faint spectral Doppler waveform)     07/2013 TEE Study Conclusions  - Left ventricle: Systolic function was moderately reduced.   The estimated ejection fraction was in the range of 35% to   40%. - Aortic valve: There was mild to moderate stenosis. Mild to   moderate regurgitation. - Mitral valve: The  findings are consistent with moderate to   severe stenosis. Valve area by pressure half-time:   1.45cm^2. - Left atrium: There was spontaneous echo contrast   ("smoke"). - Atrial septum: No defect or patent foramen ovale was   identified. - Tricuspid valve: No evidence of vegetation. Transesophageal echocardiography.  2D and color Doppler. Height:  Height: 157.5cm. Height: 62in.  Weight:  Weight: 75kg. Weight: 165lb.  Body mass index:  BMI: 30.2kg/m^2. Body surface area:    BSA: 1.44m^2.  Blood pressure: 143/56.  Patient status:  Inpatient.  Location:  Endoscopy.      06/2018 echo Study Conclusions   - Left ventricle: The cavity size was normal. Wall thickness was   normal. Systolic function was normal. The estimated ejection   fraction was in the range of 60% to 65%. - Aortic valve: AV prosthesis is difficult to see Peak and mean   gradients through the valve are 18 and 10 mm Hg respectively. - Mitral valve: Peak and mean gradients through the valve   prosthesis are 19 and 4 mm Hg respectively MVA by P T1/2 is 3   cm2. s/p MV prosthesis. Valve prosthesis is difficult to see. - Pulmonary arteries: PA peak pressure: 48 mm Hg (S).     04/2020 echo IMPRESSIONS     1. Left ventricular ejection fraction, by estimation, is 60 to 65%. The  left ventricle has normal function. The left ventricle has no regional  wall motion abnormalities. Left ventricular diastolic parameters are  indeterminate.   2. Right ventricular systolic function is low normal. The right  ventricular size is normal. There is moderately elevated pulmonary artery  systolic pressure. The estimated right ventricular systolic pressure is  45.5 mmHg.   3. Left atrial size was mildly dilated.   4. The mitral valve has been repaired/replaced.  No evidence of mitral  valve regurgitation. The mean mitral valve gradient is 5.0 mmHg. There is  a 25 mm Magna Ease bioprosthetic valve present in the mitral position.  Grossly  normal function.   5. The aortic valve has been repaired/replaced. Aortic valve  regurgitation is not visualized. There is a 21 mm Magna Ease bioprosthetic  valve present in the aortic position. Aortic valve mean gradient measures  7.0 mmHg. Grossly normal function.   6. The inferior vena cava is normal in size with greater than 50%  respiratory variability, suggesting right atrial pressure of 3 mmHg.     07/2013 Cath Impression: 1. No angiographic evidence of CAD 2. Elevated filling pressures     01/2021 nuclear stress   Atrial fibrillation present throughout. No diagnostic ST segment changes to indicate ischemia. Small, mild to moderate intensity, mid to apical anterior defect. This is fixed with the exception of a small degree of partial reversibility in the mid zone. Suggestive of scar versus variable soft tissue attenuation. This is a low risk study. Nuclear stress EF: 50%.   01/2021 carotid US Summary:  Right Carotid: Velocities in the right ICA are consistent with a 1-39%  stenosis.   Left Carotid: Velocities in the left ICA are consistent with a 1-39%  stenosis.   Vertebrals:  Bilateral vertebral arteries demonstrate antegrade flow.  Subclavians: Normal flow hemodynamics were seen in bilateral subclavian               arteries.         Assessment and Plan  1. Chronic diastolic HF/SOB - change torsemide to 20mg  daily, can take additional 20mg  prn. Appears to be doing well, monitor renal function closely with diurtic  2. Afib/aflutter - no symptoms, continue current meds      Antoine Poche, M.D.

## 2023-07-10 NOTE — Patient Instructions (Addendum)
Medication Instructions:   Change your Torsemide to 20mg  daily, may take an additional 20mg  as needed for swelling - call pharmacy to request when need new refill.  Continue all other medications.     Labwork:  none  Testing/Procedures:  none  Follow-Up:  6 months   Any Other Special Instructions Will Be Listed Below (If Applicable).   If you need a refill on your cardiac medications before your next appointment, please call your pharmacy.

## 2023-07-11 ENCOUNTER — Ambulatory Visit: Payer: Medicare PPO | Admitting: Nurse Practitioner

## 2023-07-12 DIAGNOSIS — Z79899 Other long term (current) drug therapy: Secondary | ICD-10-CM | POA: Diagnosis not present

## 2023-07-12 DIAGNOSIS — I5032 Chronic diastolic (congestive) heart failure: Secondary | ICD-10-CM | POA: Diagnosis not present

## 2023-07-12 DIAGNOSIS — N1831 Chronic kidney disease, stage 3a: Secondary | ICD-10-CM | POA: Diagnosis not present

## 2023-07-12 DIAGNOSIS — E1129 Type 2 diabetes mellitus with other diabetic kidney complication: Secondary | ICD-10-CM | POA: Diagnosis not present

## 2023-07-19 DIAGNOSIS — Z23 Encounter for immunization: Secondary | ICD-10-CM | POA: Diagnosis not present

## 2023-07-19 DIAGNOSIS — I5032 Chronic diastolic (congestive) heart failure: Secondary | ICD-10-CM | POA: Diagnosis not present

## 2023-07-19 DIAGNOSIS — N1831 Chronic kidney disease, stage 3a: Secondary | ICD-10-CM | POA: Diagnosis not present

## 2023-07-19 DIAGNOSIS — E1122 Type 2 diabetes mellitus with diabetic chronic kidney disease: Secondary | ICD-10-CM | POA: Diagnosis not present

## 2023-07-24 ENCOUNTER — Other Ambulatory Visit: Payer: Self-pay | Admitting: Cardiology

## 2023-07-30 ENCOUNTER — Other Ambulatory Visit: Payer: Self-pay | Admitting: Internal Medicine

## 2023-07-30 DIAGNOSIS — R0609 Other forms of dyspnea: Secondary | ICD-10-CM

## 2023-08-07 ENCOUNTER — Ambulatory Visit: Payer: Medicare PPO | Attending: Cardiology | Admitting: *Deleted

## 2023-08-07 DIAGNOSIS — Z5181 Encounter for therapeutic drug level monitoring: Secondary | ICD-10-CM | POA: Diagnosis not present

## 2023-08-07 DIAGNOSIS — Z952 Presence of prosthetic heart valve: Secondary | ICD-10-CM | POA: Diagnosis not present

## 2023-08-07 LAB — POCT INR: INR: 3 (ref 2.0–3.0)

## 2023-08-07 NOTE — Patient Instructions (Signed)
Continue warfarin 1 tablet daily except 1 1/2 tablets on Sundays and Thursdays Recheck INR in 6 weeks

## 2023-08-09 ENCOUNTER — Ambulatory Visit: Payer: Medicare PPO | Admitting: Internal Medicine

## 2023-08-09 NOTE — Progress Notes (Unsigned)
[name], female    DOB: 1946/06/19      MRN: [id number]   Brief patient profile:  77  yowf never smoker with a h/o sob related to asthma and valvular heart dz s/p MVR and AVR 2014   Seen prior to Epic in [place]: Feb 20, 2009 initial pulmonary eval started aerobics 3 months prior to OV  X 79 min weekly and tolerated well with improving ex tol until 3 weeks prior to OV   noted decrease tolerance and since then gradually worse to point where walking to mailbox slt uphill or climbing steps has to stop at top.  In retrospect coughing for 3 months dry, worse immediately on lying down. due to the cough lisnopril was stopped and could not tolerate the substitute and cough hasn't changed but breathing got worse. prednisone no help so far and proaire no benefit. rec GERD diet   continue prilosec and reglan stop prednisone and proaire  avoid salt  maxzide 25 one each am and Dr Marvel Plan office   10/16/12 Meth challenge  + for hyperreactive airways disease  clance eval 05/14/13 [name] that at this point is clearly upper airway in origin.  [name], but her current cough failed to respond to prednisone tapers.  Therefore it is unlikely that it is related to cough variant asthma.  She does not have chronic sinusitis by her CT scan, and there is nothing on chest x-ray earlier in the year to explain her cough.  She does have a history of breast cancer, but there has been no evidence for recurrence.  [name], but this has an extremely low yield.  [name], and therefore I am concerned she may be having laryngopharyngeal reflux as a cause for her cough.  I also think she may have a cyclical mechanism as well she did have some response to tramadol, and we could consider a trial of gabapentin  AVR  07/18/13   04/2020 echo  1. Left ventricular ejection fraction, by  estimation, is 60 to 65%. The  left ventricle has normal function. The left ventricle has no regional  wall motion abnormalities. Left ventricular diastolic parameters are  indeterminate.   2. Right ventricular systolic function is low normal. The right  ventricular size is normal. There is moderately elevated pulmonary artery  systolic pressure. The estimated right ventricular systolic pressure is  45.5 mmHg.   3. Left atrial size was mildly dilated.   4. The mitral valve has been repaired/replaced. No evidence of mitral  valve regurgitation. The mean mitral valve gradient is 5.0 mmHg. There is  a 25 mm Magna Ease bioprosthetic valve present in the mitral position.  Grossly normal function.   5. The aortic valve has been repaired/replaced. Aortic valve  regurgitation is not visualized. There is a 21 mm Magna Ease bioprosthetic  valve present in the aortic position. Aortic valve mean gradient measures  7.0 mmHg. Grossly normal function.   6. The inferior vena cava is normal in size with greater than 50%  respiratory variability, suggesting right atrial pressure of 3 mmHg.        History of Present Illness  06/23/2020  Pulmonary/ 1st office eval/ [name] / [place] [facility] / re establish re cough and sob on flovent 44 2bid  For cough variant asthma  Chief Complaint  Patient presents with  Consult    Patient has a dry cough that has been going on for many years but worse over the last 6 months. Shortness of breath with exertion.   Dyspnea:  Walks dog x 30 min / mostly flat uses cane to balance/ steps ok slowly due to breathing problems x sev years prior to OV   Cough: worse in afternoons and sometimes hs has to sit up 30 degrees to prevent cough supine  Sleep: ok as long as head is up 30 degrees new x 2 years  SABA use: sev times a day seems to help at 4 pm rec GERD diet  Pantoprazole (protonix) 40 mg   Take  30-60 min before first meal of the day and Pepcid (famotidine)  20 mg one  after supper until return to office - this is the best way to tell whether stomach acid is contributing to your problem.   Plan A = Automatic = Always=    flovent Take 2 puffs first thing in am and then another 2 puffs about 12 hours later.  Work on inhaler technique:  Plan B = Backup (to supplement plan A, not to replace it) Only use your albuterol inhaler as a rescue medication   Please schedule a follow up office visit in 6 weeks, call sooner if needed - bring inhalers  Next step ? Gabapentin?    07/22/2021  f/u ov/[name] re: cough > sob   maint on flovent  for dx of MCT pos cough variant asthma / stopped gabapentin ? Why ? Chief Complaint  Patient presents with   Follow-up    Productive cough with thick white mucus   Dyspnea:  mailbox and back is 200 yards slow pace Cough: daytime cough / lots of throat clearing  Sleeping: 30 degrees ok  SABA use: using saba bid/ poor flovent use  02: none  Covid status:   vax x 5  Rec Pantoprazole (protonix) 40 mg   Take  30-60 min before first meal of the day and Pepcid (famotidine)  20 mg after supper until return to office   Gabapentin 100 mg four times daily  Plan A = Automatic = Always=   Symbicort 80 mg  Take 2 puffs first thing in am and then another 2 puffs about 12 hours later. (Stop flovent and atrovent- unless can't afford symbicort on your plan) Plan B = Backup (to supplement plan A, not to replace it) Only use your albuterol inhaler as a rescue medication   Plan C = Crisis (instead of Plan B but only if Plan B stops working) - only use your albuterol nebulizer if you first try Plan B   For cough > mucinex dm 1200 every 12 hours as needed    02/10/2023  f/u ov/[name] office/[name] re: cough variant asthma  maint on bud 0.5 mg bid and albuterol per neb   Chief Complaint  Patient presents with   Follow-up    SOB with exertion, prod cough with thick white sputum and wheezing.  Dyspnea: able to walk  stop sign and back 400 meters round  trip  Cough: worse lately with subjective  wheezing > no purulent sputum Sleeping: 30 degrees hob/ no resp cc  SABA use: neb doesn't help  02: none  Rec Increase the gabapentin (100 x 2) three x daily(600 total)  until use up and if tolerated then start 300 mg  twice daily (600 mg total)  Depomedrol 120 mg IM  Plan A = Automatic =  Always=    Formoterol 20 mcg/ budesonide 0.50 twice daily  Plan B = Backup (to supplement plan A, not to replace it) Only use your albuterol nebulizer  as a rescue medication    08/10/2023  f/u ov/[place] [facility]/[name] re: cough variant asthma vs uacs (pos MCT)  maint on prn saba hfa  - says nebs make cough worse and don't help breathing./ symbicort worked the best though hfa really poor  Chief Complaint  Patient presents with   Cough  Dyspnea:  walking up to 30 min a day stopping every 5-10 times due sob  Cough: min productive mucoid/ tends to before afternoon or evening when cools off and not necessarily related to supper  Sleeping: 30 degree bed s resp cc  SABA use: rarely  No better on prednisone 0       No obvious day to day or daytime variability or assoc purulent sputum or mucus plugs or hemoptysis or cp or chest tightness, subjective wheeze or overt  hb symptoms.    Also denies any obvious fluctuation of symptoms with weather or environmental changes or other aggravating or alleviating factors except as outlined above   No unusual exposure hx or h/o childhood pna/ asthma or knowledge of premature birth.  Current Allergies, Complete Past Medical History, Past Surgical History, Family History, and Social History were reviewed in [employer] record.  ROS  The following are not active complaints unless bolded Hoarseness, sore throat, dysphagia, dental problems, itching, sneezing,  nasal congestion or discharge of excess mucus or purulent secretions, ear ache,   fever, chills, sweats, unintended wt loss or wt gain, classically  pleuritic or exertional cp,  orthopnea pnd or arm/hand swelling  or leg swelling, presyncope, palpitations, abdominal pain, anorexia, nausea, vomiting, diarrhea  or change in bowel habits or change in bladder habits, change in stools or change in urine, dysuria, hematuria,  rash, arthralgias, visual complaints, headache, numbness, weakness or ataxia or problems with walking/uses cane  or coordination,  change in mood or  memory.        Current Meds  Medication Sig   albuterol (PROVENTIL) (2.5 MG/3ML) 0.083% nebulizer solution USE 1 VIAL IN NEBULIZER EVERY4 HOURS AS NEEDED.   atorvastatin (LIPITOR) 40 MG tablet TAKE ONE TABLET BY MOUTH ONCE DAILY.   budesonide (PULMICORT) 0.5 MG/2ML nebulizer solution USE 1/2 VIAL IN NEBULIZER TWICE DAILY. DISCARD UNUSED PORTION AFTER EACH USE. (Patient taking differently: Take 0.25 mg by nebulization 2 (two) times daily. USE 1/2 VIAL IN NEBULIZER TWICE DAILY. DISCARD UNUSED PORTION AFTER EACH USE.)   dextromethorphan-guaiFENesin (MUCINEX DM) 30-600 MG 12hr tablet Take 1 tablet by mouth 2 (two) times daily as needed for cough.   escitalopram (LEXAPRO) 10 MG tablet Take 10 mg by mouth daily with breakfast.   famotidine (PEPCID) 20 MG tablet TAKE 1 TABLET AFTER SUPPER   FARXIGA 10 MG TABS tablet Take 10 mg by mouth daily.   fexofenadine (ALLEGRA) 180 MG tablet Take 180 mg by mouth in the morning.   fluticasone (FLONASE) 50 MCG/ACT nasal spray Place 1 spray into both nostrils daily.   formoterol (PERFOROMIST) 20 MCG/2ML nebulizer solution Take 2 mLs (20 mcg total) by nebulization 2 (two) times daily. Use in nebulizer twice daily perfectly regularly   gabapentin (NEURONTIN) 300 MG capsule Take 1 capsule (300 mg total) by mouth 2 (two) times daily.   levothyroxine (SYNTHROID) 100 MCG tablet Take 100 mcg by mouth daily before breakfast.   metFORMIN (GLUCOPHAGE) 500 MG tablet  Take 1 tablet (500 mg total) by mouth 2 (two) times daily with a meal.   metoprolol tartrate  (LOPRESSOR) 100 MG tablet Take 1 tablet (100 mg total) by mouth 2 (two) times daily.   Multiple Vitamin (MULITIVITAMIN WITH MINERALS) TABS Take 1 tablet by mouth daily.   pantoprazole (PROTONIX) 40 MG tablet TAKE 1 TABLET 30 TO 60 MINUTES BEFORE FIRST MEAL OF THE DAY.   potassium chloride SA (KLOR-CON M) 20 MEQ tablet Take 1 tablet (20 mEq total) by mouth daily.   torsemide (DEMADEX) 20 MG tablet Take 1 tablet (20 mg total) by mouth daily. (May take an additional 20mg  as needed for swelling)   warfarin (COUMADIN) 2.5 MG tablet TAKE AS DIRECTED BY COUMADIN CLINIC.                    Past Medical History:  Diagnosis Date   Anemia    Atrial fibrillation (HCC)    Breast carcinoma (HCC) 1995   1995   Cardioembolic stroke (HCC) 01/2012   Right frontal in 01/2012; normal carotid ultrasound; possible LAA thrombus by TEE; virtual complete neurologic recovery   Chronic kidney disease, stage 2, mildly decreased GFR    GFR of approximately 60   Depression    Diverticulosis of colon (without mention of hemorrhage) 2012   Dr. Karilyn Cota   Fasting hyperglycemia    120 fasting   Gastroesophageal reflux disease    Gastroparesis    Hemorrhoids    Hyperlipidemia    Hypertension    pt denies 05/30/13     Dr Hinton Rao   Hyponatremia    Rheumatic heart disease    a. 07/2013 Echo: Ef 55-60%, Mod AS/AI, Mod-Sev MS, sev dil LA, PASP 58;  b. 07/2013 TEE: EF 35-40%, mild-mod AS/AI, mod-sev MS, LA smoke;  c. 07/2013 Cath: elev R heart pressures, nl cors.   Shortness of breath     Past Medical History:    Hx of NEOPLASM, MALIGNANT, BREAST (ICD-174.9)    HYPERLIPIDEMIA (ICD-272.4)    ASTHMA (ICD-493.90)    GERD with gastroparesis   Past Surgical History:    Breast surgery 1995    Heart valve surgery 1962 age 47 [facility] Mitral valve     Tubal ligation-1973   Objective:     Wt  08/10/2023         169  02/10/2023        158  07/25/2022      167 04/20/2022        170  01/21/2022        177   12/13/2021        177 09/14/2021      170  07/22/2021      162  10/29/2020        177 09/16/2020      178   08/05/20 178 lb 12.8 oz (81.1 kg)  06/25/20 194 lb (88 kg)  06/23/20 180 lb (81.6 kg)    Vital signs reviewed  08/10/2023  - Note at rest 02 sats  92% on RA   General appearance:    amb somber wf nad    HEENT : Oropharynx  clear      Nasal turbinates nl    NECK :  without  apparent JVD/ palpable Nodes/TM    LUNGS: no acc muscle use,  Nl contour chest which is clear to A and P bilaterally without cough on insp or exp maneuvers   CV:  RRR  no  s3   2-3 /6 SEM s  increase in P2, and no edema   ABD:  soft and nontender with nl inspiratory excursion in the supine position. No bruits or organomegaly appreciated   MS:  Nl gait/ ext warm without deformities Or obvious joint restrictions  calf tenderness, cyanosis or clubbing    SKIN: warm and dry without lesions    NEURO:  alert, approp, nl sensorium with  no motor or cerebellar deficits apparent.             Assessment

## 2023-08-10 ENCOUNTER — Encounter: Payer: Self-pay | Admitting: Cardiology

## 2023-08-10 ENCOUNTER — Encounter: Payer: Self-pay | Admitting: Internal Medicine

## 2023-08-10 ENCOUNTER — Ambulatory Visit: Payer: Medicare PPO | Admitting: Internal Medicine

## 2023-08-10 VITALS — BP 103/67 | HR 69 | Ht 62.0 in | Wt 169.0 lb

## 2023-08-10 DIAGNOSIS — I10 Essential (primary) hypertension: Secondary | ICD-10-CM

## 2023-08-10 DIAGNOSIS — R0609 Other forms of dyspnea: Secondary | ICD-10-CM | POA: Diagnosis not present

## 2023-08-10 DIAGNOSIS — R058 Other specified cough: Secondary | ICD-10-CM | POA: Diagnosis not present

## 2023-08-10 MED ORDER — BISOPROLOL FUMARATE 10 MG PO TABS: ORAL_TABLET | ORAL | 11 refills | Status: DC

## 2023-08-10 MED ORDER — BUDESONIDE-FORMOTEROL FUMARATE 80-4.5 MCG/ACT IN AERO: INHALATION_SPRAY | RESPIRATORY_TRACT | 12 refills | Status: DC

## 2023-08-10 NOTE — Assessment & Plan Note (Signed)
Original onset 2010 on ACEi  Spiro 09/2012: normal  Sinus ct 2014:  No chronic sinus disease. 10/16/12 Meth challenge  + for hyperreactive airways disease 04/30/20 CT head sinus nl - 06/23/2020  After extensive coaching inhaler device,  effectiveness =    75% from a baseline of 25% > continue flovent 44 2bid and prn saba plus max gerd rx  08/05/2020    changed to symb 80 2bid and given depomedrol 120 mg IM  > no better - 09/16/2020  After extensive coaching inhaler device,  effectiveness =    50% baseline of less than 25% - 09/16/2020 started gabapentin titrate up to 100 mg qid  -  11/26/20  Sinus CT neg  - Allergy profile 07/22/2021 >  Eos 0.2 /  IgE  15 - 09/14/2021  After extensive coaching inhaler device,  effectiveness =    0% so changed to alb/bud bid > 12/13/2021 did not follow instructions so try again same rx  With  Prednisone 10 mg take  4 each am x 2 days,   2 each am x 2 days,  1 each am x 2 days and stop and bud 0.25 mg bid combined with albuterol since can't afford performist on her plan. - FENO 02/10/2023  =29  on bud 0.25 mg bid > added formoterol 20 mcg bid and increased  budesonide 0.5 mg bid  - 02/10/2023 try increase gabapentin to 300 mg bid   Insists that neb makes cough worse and symb 80 best results so ok to resume but should bring inhalers with her clinic to confirm being used correctly

## 2023-08-10 NOTE — Patient Instructions (Addendum)
Stop lopressor and start bisoprolol 10 mg twice daily in its place   Plan A = Automatic = Always=    Symbicort 80 Take 2 puffs first thing in am and then another 2 puffs about 12 hours later.     Plan B = Backup (to supplement plan A, not to replace it) Only use your albuterol inhaler as a rescue medication to be used if you can't catch your breath by resting or doing a relaxed purse lip breathing pattern.  - The less you use it, the better it will work when you need it. - Ok to use the inhaler up to 2 puffs  every 4 hours if you must but call for appointment if use goes up over your usual need - Don't leave home without it !!  (think of it like the spare tire for your car)   Plan C = Crisis (instead of Plan B but only if Plan B stops working) - only use your albuterol nebulizer if you first try Plan B and it fails to help > ok to use the nebulizer up to every 4 hours but if start needing it regularly call for immediate appointment  Please schedule a follow up office visit in 6 weeks, call sooner if needed - bring inhalers

## 2023-08-10 NOTE — Assessment & Plan Note (Signed)
In the setting of respiratory symptoms of unknown etiology,  It would be preferable to use bystolic, the most beta -1  selective Beta blocker available in sample form, with bisoprolol the most selective generic choice  on the market, at least on a trial basis, to make sure the spillover Beta 2 effects of the less specific Beta blockers are not contributing to this patient's symptoms.   >>> try bisoprolol 10 mg bid since working dx is asthma and high doses of lopressor due have a spillover B2 effect  F/u in 6 weeks with all meds in hand using a trust but verify approach to confirm accurate Medication  Reconciliation The principal here is that until we are certain that the  patients are doing what we've asked, it makes no sense to ask them to do more.   Each maintenance medication was reviewed in detail including emphasizing most importantly the difference between maintenance and prns and under what circumstances the prns are to be triggered using an action plan format where appropriate.  Total time for H and P, chart review, counseling, reviewing hfa/ neb  device(s) , directly observing portions of ambulatory 02 saturation study/ and generating customized AVS unique to this office visit / same day charting > 30 min for multiple  refractory respiratory  symptoms of uncertain etiology

## 2023-08-10 NOTE — Assessment & Plan Note (Signed)
12/13/2021   Walked on RA  x  3  lap(s) =  approx 450  ft  @ slow pace, stopped due to end of study/ sob with lowest 02 sats 93%  - Echo 04/01/22   1. Left ventricular ejection fraction, by estimation, is 60%. The left  ventricle has normal function. The left ventricle has no regional wall  motion abnormalities. Left ventricular diastolic function could not be  evaluated.   2. Caclified papillary muscles.   3. Right ventricular systolic function is normal. The right ventricular  size is normal. There is normal pulmonary artery systolic pressure.   4. Left atrial size was moderately dilated. 04/20/2022   Walked on RA  x  3  lap(s) =  approx 450   ft  @ moderate/cane pace, stopped due to end of study with lowest 02 sats 91% and mild sob starting on 2nd lap  - 08/10/2023 patient walked 2 laps at a slow pace with cane; patient declined 3rd lap d/t shob/fatigue with lowest sats 90%  >>> appears to be mostly a conditioning issue at present

## 2023-08-11 ENCOUNTER — Other Ambulatory Visit: Payer: Self-pay

## 2023-08-11 MED ORDER — BISOPROLOL FUMARATE 10 MG PO TABS: 10.0000 mg | ORAL_TABLET | Freq: Every day | ORAL | Status: DC

## 2023-08-11 NOTE — Telephone Encounter (Signed)
Would change to just bisoprolol 10mg  once a day as opposed to bid  Dominga Ferry MD

## 2023-08-27 ENCOUNTER — Other Ambulatory Visit: Payer: Self-pay | Admitting: Nurse Practitioner

## 2023-09-18 ENCOUNTER — Ambulatory Visit: Payer: Medicare PPO | Attending: Cardiology | Admitting: *Deleted

## 2023-09-18 DIAGNOSIS — Z5181 Encounter for therapeutic drug level monitoring: Secondary | ICD-10-CM

## 2023-09-18 DIAGNOSIS — Z952 Presence of prosthetic heart valve: Secondary | ICD-10-CM | POA: Diagnosis not present

## 2023-09-18 LAB — POCT INR: INR: 2.7 (ref 2.0–3.0)

## 2023-09-18 NOTE — Patient Instructions (Signed)
 Continue warfarin 1 tablet daily except 1 1/2 tablets on Sundays and Thursdays Recheck INR in 6 weeks

## 2023-09-21 ENCOUNTER — Encounter: Payer: Self-pay | Admitting: Internal Medicine

## 2023-09-21 ENCOUNTER — Ambulatory Visit: Payer: Medicare PPO | Admitting: Internal Medicine

## 2023-09-21 VITALS — BP 107/66 | HR 68 | Ht 62.0 in | Wt 169.0 lb

## 2023-09-21 DIAGNOSIS — R058 Other specified cough: Secondary | ICD-10-CM | POA: Diagnosis not present

## 2023-09-21 DIAGNOSIS — I10 Essential (primary) hypertension: Secondary | ICD-10-CM | POA: Diagnosis not present

## 2023-09-21 MED ORDER — GABAPENTIN 300 MG PO CAPS: 300.0000 mg | ORAL_CAPSULE | Freq: Three times a day (TID) | ORAL | 3 refills | Status: DC

## 2023-09-21 NOTE — Patient Instructions (Signed)
Increase gabapentin to 300 mg three times daily with meals   My office will be contacting you by phone for referral to ENT   - if you don't hear back from my office within one week please call us back or notify us thru MyChart and we'll address it right away.  Please schedule a follow up visit in 6  months but call sooner if needed

## 2023-09-21 NOTE — Progress Notes (Signed)
[name], female    DOB: January 26, 1946      MRN: [id number]   Brief patient profile:  77  yowf never smoker with a h/o sob related to asthma and valvular heart dz s/p MVR and AVR 2014   Seen prior to Epic in [place]: Feb 20, 2009 initial pulmonary eval started aerobics 3 months prior to OV  X 30 min weekly and tolerated well with improving ex tol until 3 weeks prior to OV   noted decrease tolerance and since then gradually worse to point where walking to mailbox slt uphill or climbing steps has to stop at top.  In retrospect coughing for 3 months dry, worse immediately on lying down. due to the cough lisnopril was stopped and could not tolerate the substitute and cough hasn't changed but breathing got worse. prednisone no help so far and proaire no benefit. rec GERD diet   continue prilosec and reglan stop prednisone and proaire  avoid salt  maxzide 25 one each am and Dr Marvel Plan [name]   10/16/12 Meth challenge  + for hyperreactive airways disease  clance eval 05/14/13 [name] that at this point is clearly upper airway in origin.  [name], but her current cough failed to respond to prednisone tapers.  Therefore it is unlikely that it is related to cough variant asthma.  She does not have chronic sinusitis by her CT scan, and there is nothing on chest x-ray earlier in the year to explain her cough.  She does have a history of breast cancer, but there has been no evidence for recurrence.  [name], but this has an extremely low yield.  [name], and therefore I am concerned she may be having laryngopharyngeal reflux as a cause for her cough.  I also think she may have a cyclical mechanism as well she did have some response to tramadol, and we could consider a trial of gabapentin  AVR  07/18/13   04/2020 echo  1. Left ventricular ejection fraction, by  estimation, is 60 to 65%. The  left ventricle has normal function. The left ventricle has no regional  wall motion abnormalities. Left ventricular diastolic parameters are  indeterminate.   2. Right ventricular systolic function is low normal. The right  ventricular size is normal. There is moderately elevated pulmonary artery  systolic pressure. The estimated right ventricular systolic pressure is  45.5 mmHg.   3. Left atrial size was mildly dilated.   4. The mitral valve has been repaired/replaced. No evidence of mitral  valve regurgitation. The mean mitral valve gradient is 5.0 mmHg. There is  a 25 mm Magna Ease bioprosthetic valve present in the mitral position.  Grossly normal function.   5. The aortic valve has been repaired/replaced. Aortic valve  regurgitation is not visualized. There is a 21 mm Magna Ease bioprosthetic  valve present in the aortic position. Aortic valve mean gradient measures  7.0 mmHg. Grossly normal function.   6. The inferior vena cava is normal in size with greater than 50%  respiratory variability, suggesting right atrial pressure of 3 mmHg.        History of Present Illness  06/23/2020  Pulmonary/ 1st [name] eval/ [name] / [name] [name] / re establish re cough and sob on flovent 44 2bid  For cough variant asthma  Chief Complaint  Patient presents with  Consult    Patient has a dry cough that has been going on for many years but worse over the last 6 months. Shortness of breath with exertion.   Dyspnea:  Walks dog x 30 min / mostly flat uses cane to balance/ steps ok slowly due to breathing problems x sev years prior to OV   Cough: worse in afternoons and sometimes hs has to sit up 30 degrees to prevent cough supine  Sleep: ok as long as head is up 30 degrees new x 2 years  SABA use: sev times a day seems to help at 4 pm rec GERD diet  Pantoprazole (protonix) 40 mg   Take  30-60 min before first meal of the day and Pepcid (famotidine)  20 mg one  after supper until return to [name] - this is the best way to tell whether stomach acid is contributing to your problem.   Plan A = Automatic = Always=    flovent Take 2 puffs first thing in am and then another 2 puffs about 12 hours later.  Work on inhaler technique:  Plan B = Backup (to supplement plan A, not to replace it) Only use your albuterol inhaler as a rescue medication   Please schedule a follow up [name] visit in 6 weeks, call sooner if needed - bring inhalers  Next step ? Gabapentin?    07/22/2021  f/u ov/[name] re: cough > sob   maint on flovent  for dx of MCT pos cough variant asthma / stopped gabapentin ? Why ? Chief Complaint  Patient presents with   Follow-up    Productive cough with thick white mucus   Dyspnea:  mailbox and back is 200 yards slow pace Cough: daytime cough / lots of throat clearing  Sleeping: 30 degrees ok  SABA use: using saba bid/ poor flovent use  02: none  Covid status:   vax x 5  Rec Pantoprazole (protonix) 40 mg   Take  30-60 min before first meal of the day and Pepcid (famotidine)  20 mg after supper until return to [name]   Gabapentin 100 mg four times daily  Plan A = Automatic = Always=   Symbicort 80 mg  Take 2 puffs first thing in am and then another 2 puffs about 12 hours later. (Stop flovent and atrovent- unless can't afford symbicort on your plan) Plan B = Backup (to supplement plan A, not to replace it) Only use your albuterol inhaler as a rescue medication   Plan C = Crisis (instead of Plan B but only if Plan B stops working) - only use your albuterol nebulizer if you first try Plan B   For cough > mucinex dm 1200 every 12 hours as needed    02/10/2023  f/u ov/[name]/[name] re: cough variant asthma  maint on bud 0.5 mg bid and albuterol per neb   Chief Complaint  Patient presents with   Follow-up    SOB with exertion, prod cough with thick white sputum and wheezing.  Dyspnea: able to walk  stop sign and back 400 meters round  trip  Cough: worse lately with subjective  wheezing > no purulent sputum Sleeping: 30 degrees hob/ no resp cc  SABA use: neb doesn't help  02: none  Rec Increase the gabapentin (100 x 2) three x daily(600 total)  until use up and if tolerated then start 300 mg  twice daily (600 mg total)  Depomedrol 120 mg IM  Plan A = Automatic =  Always=    Formoterol 20 mcg/ budesonide 0.50 twice daily  Plan B = Backup (to supplement plan A, not to replace it) Only use your albuterol nebulizer  as a rescue medication    08/10/2023  f/u ov/[name]/[name] re: cough variant asthma vs uacs (pos MCT)  maint on prn saba hfa  - says nebs make cough worse and don't help breathing./ symbicort worked the best though hfa really poor  Chief Complaint  Patient presents with   Cough  Dyspnea:  walking up to 30 min a day stopping every 5-10 times due sob  Cough: min productive mucoid/ tends to before afternoon or evening when cools off and not necessarily related to supper  Sleeping: 30 degree bed s resp cc  SABA use: rarely  No better on prednisone   Rec Stop lopressor and start bisoprolol 10 mg twice daily in its place  Plan A = Automatic = Always=    Symbicort 80 Take 2 puffs first thing in am and then another 2 puffs about 12 hours later.  Plan B = Backup (to supplement plan A, not to replace it) Only use your albuterol inhaler as a rescue medication Plan C = Crisis (instead of Plan B but only if Plan B stops working) - only use your albuterol nebulizer if you first try Plan B   Please schedule a follow up [name] visit in 6 weeks, call sooner if needed - bring inhalers    09/21/2023  f/u ov/[name]/[name] re: UACS vs asthma  maint on symbicort 80  Chief Complaint  Patient presents with   Cough   Shortness of Breath  Dyspnea:  if anything walking faster  Cough: better but still has sense of globus seems worse x the last 3 weeks prior to OV   Sleeping: 30 degrees hob s    resp cc  SABA  use: rarely needs  02: no    No obvious day to day or daytime variability or assoc excess/ purulent sputum or mucus plugs or hemoptysis or cp or chest tightness, subjective wheeze or overt sinus or hb symptoms.    Also denies any obvious fluctuation of symptoms with weather or environmental changes or other aggravating or alleviating factors except as outlined above   No unusual exposure hx or h/o childhood pna/ asthma or knowledge of premature birth.  Current Allergies, Complete Past Medical History, Past Surgical History, Family History, and Social History were reviewed in [employer] record.  ROS  The following are not active complaints unless bolded Hoarseness, sore throat, dysphagia= globus , dental problems, itching, sneezing,  nasal congestion or discharge of excess mucus or purulent secretions, ear ache,   fever, chills, sweats, unintended wt loss or wt gain, classically pleuritic or exertional cp,  orthopnea pnd or arm/hand swelling  or leg swelling, presyncope, palpitations, abdominal pain, anorexia, nausea, vomiting, diarrhea  or change in bowel habits or change in bladder habits, change in stools or change in urine, dysuria, hematuria,  rash, arthralgias, visual complaints, headache, numbness, weakness or ataxia or problems with walking/ uses cane  or coordination,  change in mood or  memory.        Current Meds  Medication Sig   albuterol (PROVENTIL) (2.5 MG/3ML) 0.083% nebulizer solution USE 1 VIAL IN NEBULIZER EVERY4 HOURS AS NEEDED.   albuterol (VENTOLIN HFA) 108 (90 Base) MCG/ACT inhaler Inhale 2 puffs into the lungs every 6 (six) hours as needed for wheezing or shortness of breath.  atorvastatin (LIPITOR) 40 MG tablet TAKE ONE TABLET BY MOUTH ONCE DAILY.   bisoprolol (ZEBETA) 10 MG tablet Take 1 tablet (10 mg total) by mouth daily.   budesonide-formoterol (SYMBICORT) 80-4.5 MCG/ACT inhaler Take 2 puffs first thing in am and then another 2 puffs about 12  hours later.   dextromethorphan-guaiFENesin (MUCINEX DM) 30-600 MG 12hr tablet Take 1 tablet by mouth 2 (two) times daily as needed for cough.   escitalopram (LEXAPRO) 10 MG tablet Take 10 mg by mouth daily with breakfast.   famotidine (PEPCID) 20 MG tablet TAKE 1 TABLET AFTER SUPPER   FARXIGA 10 MG TABS tablet Take 10 mg by mouth daily.   fexofenadine (ALLEGRA) 180 MG tablet Take 180 mg by mouth in the morning.   fluticasone (FLONASE) 50 MCG/ACT nasal spray Place 1 spray into both nostrils daily.   gabapentin (NEURONTIN) 300 MG capsule Take 1 capsule (300 mg total) by mouth 2 (two) times daily.   levothyroxine (SYNTHROID) 100 MCG tablet Take 100 mcg by mouth daily before breakfast.   metFORMIN (GLUCOPHAGE) 500 MG tablet Take 1 tablet (500 mg total) by mouth 2 (two) times daily with a meal.   Multiple Vitamin (MULITIVITAMIN WITH MINERALS) TABS Take 1 tablet by mouth daily.   pantoprazole (PROTONIX) 40 MG tablet TAKE 1 TABLET 30 TO 60 MINUTES BEFORE FIRST MEAL OF THE DAY.   potassium chloride SA (KLOR-CON M) 20 MEQ tablet Take 1 tablet (20 mEq total) by mouth daily.   torsemide (DEMADEX) 20 MG tablet Take 1 tablet (20 mg total) by mouth daily. (May take an additional 20mg  as needed for swelling)   warfarin (COUMADIN) 2.5 MG tablet TAKE AS DIRECTED BY COUMADIN CLINIC.                  Past Medical History:  Diagnosis Date   Anemia    Atrial fibrillation (HCC)    Breast carcinoma (HCC) 1995   1995   Cardioembolic stroke (HCC) 01/2012   Right frontal in 01/2012; normal carotid ultrasound; possible LAA thrombus by TEE; virtual complete neurologic recovery   Chronic kidney disease, stage 2, mildly decreased GFR    GFR of approximately 60   Depression    Diverticulosis of colon (without mention of hemorrhage) 2012   Dr. Karilyn Cota   Fasting hyperglycemia    120 fasting   Gastroesophageal reflux disease    Gastroparesis    Hemorrhoids    Hyperlipidemia    Hypertension    pt denies 05/30/13      Dr Hinton Rao   Hyponatremia    Rheumatic heart disease    a. 07/2013 Echo: Ef 55-60%, Mod AS/AI, Mod-Sev MS, sev dil LA, PASP 58;  b. 07/2013 TEE: EF 35-40%, mild-mod AS/AI, mod-sev MS, LA smoke;  c. 07/2013 Cath: elev R heart pressures, nl cors.   Shortness of breath     Past Medical History:    Hx of NEOPLASM, MALIGNANT, BREAST (ICD-174.9)    HYPERLIPIDEMIA (ICD-272.4)    ASTHMA (ICD-493.90)    GERD with gastroparesis   Past Surgical History:    Breast surgery 1995    Heart valve surgery 1962 age 13 [facility] Mitral valve     Tubal ligation-1973   Objective:     Wt  09/21/2023      169  08/10/2023         169  02/10/2023        158  07/25/2022      167 04/20/2022  170  01/21/2022        177  12/13/2021        177 09/14/2021      170  07/22/2021      162  10/29/2020        177 09/16/2020      178   08/05/20 178 lb 12.8 oz (81.1 kg)  06/25/20 194 lb (88 kg)  06/23/20 180 lb (81.6 kg)    Vital signs reviewed  09/21/2023  - Note at rest 02 sats  91% on RA   General appearance:    somber wf   freq throat clearing    HEENT : Oropharynx  clear      Nasal turbinates nl    NECK :  without  apparent JVD/ palpable Nodes/TM    LUNGS: no acc muscle use,  Nl contour chest which is clear to A and P bilaterally without cough on insp or exp maneuvers   CV:  RRR  no s3 or murmur or increase in P2, and no edema   ABD:  soft and nontender   MS:  Gait walks slow pace with cane   ext warm without deformities Or obvious joint restrictions  calf tenderness, cyanosis or clubbing    SKIN: warm and dry without lesions    NEURO:  alert, approp, nl sensorium with  no motor or cerebellar deficits apparent.             Assessment

## 2023-09-22 ENCOUNTER — Encounter: Payer: Self-pay | Admitting: Internal Medicine

## 2023-09-22 NOTE — Assessment & Plan Note (Signed)
08/10/23  Changed lopressor to bisoprolol 10 mg bid (due to concern with spillover B2 effects with high dose lopressor with good control and no orthostasis as of 09/21/2023  > f/u by Dr Wyline Mood planned

## 2023-09-22 NOTE — Assessment & Plan Note (Signed)
Original onset 2010 on ACEi  Spiro 09/2012: normal  Sinus ct 2014:  No chronic sinus disease. 10/16/12 Meth challenge  + for hyperreactive airways disease 04/30/20 CT head sinus nl - 06/23/2020  After extensive coaching inhaler device,  effectiveness =    75% from a baseline of 25% > continue flovent 44 2bid and prn saba plus max gerd rx  08/05/2020    changed to symb 80 2bid and given depomedrol 120 mg IM  > no better - 09/16/2020  After extensive coaching inhaler device,  effectiveness =    50% baseline of less than 25% - 09/16/2020 started gabapentin titrate up to 100 mg qid  -  11/26/20  Sinus CT neg  - Allergy profile 07/22/2021 >  Eos 0.2 /  IgE  15 - 09/14/2021  After extensive coaching inhaler device,  effectiveness =    0% so changed to alb/bud bid > 12/13/2021 did not follow instructions so try again same rx  With  Prednisone 10 mg take  4 each am x 2 days,   2 each am x 2 days,  1 each am x 2 days and stop and bud 0.25 mg bid combined with albuterol since can't afford performist on her plan. - FENO 02/10/2023  =29  on bud 0.25 mg bid > added formoterol 20 mcg bid and increased  budesonide 0.5 mg bid  - 02/10/2023 try increase gabapentin to 300 mg bid > increased 300 mg  tid 09/21/2023 due to globus   - 09/21/2023  After extensive coaching inhaler device,  effectiveness =    75% with with spacer on 3rd try  - 09/21/2023  increased gabapentin to 300 mg tid  - - 09/21/2023 referred to ENT   Continue to feel uacs > asthma in terms of all her symptoms with refractory globus sensation which is absent noct c/w irritable larynx so will try max gabapentin and get ent opinion next   Discussed in detail all the  indications, usual  risks and alternatives  relative to the benefits with patient who agrees to proceed with Rx and w/u as outlined.          Each maintenance medication was reviewed in detail including emphasizing most importantly the difference between maintenance and prns and under what  circumstances the prns are to be triggered using an action plan format where appropriate.  Total time for H and P, chart review, counseling, reviewing hfa/spacer device(s) and generating customized AVS unique to this office visit / same day charting > 30 min for multiple  refractory respiratory  symptoms of uncertain etiology

## 2023-10-12 ENCOUNTER — Encounter (INDEPENDENT_AMBULATORY_CARE_PROVIDER_SITE_OTHER): Payer: Self-pay | Admitting: Otolaryngology

## 2023-10-15 ENCOUNTER — Other Ambulatory Visit: Payer: Self-pay | Admitting: Internal Medicine

## 2023-10-15 DIAGNOSIS — R0609 Other forms of dyspnea: Secondary | ICD-10-CM

## 2023-10-19 ENCOUNTER — Encounter (INDEPENDENT_AMBULATORY_CARE_PROVIDER_SITE_OTHER): Payer: Self-pay | Admitting: Otolaryngology

## 2023-10-25 DIAGNOSIS — E113293 Type 2 diabetes mellitus with mild nonproliferative diabetic retinopathy without macular edema, bilateral: Secondary | ICD-10-CM | POA: Diagnosis not present

## 2023-10-25 LAB — HM DIABETES EYE EXAM

## 2023-10-26 ENCOUNTER — Other Ambulatory Visit: Payer: Self-pay | Admitting: Internal Medicine

## 2023-10-27 DIAGNOSIS — Z7901 Long term (current) use of anticoagulants: Secondary | ICD-10-CM | POA: Diagnosis not present

## 2023-10-27 DIAGNOSIS — I5032 Chronic diastolic (congestive) heart failure: Secondary | ICD-10-CM | POA: Diagnosis not present

## 2023-10-27 DIAGNOSIS — E1129 Type 2 diabetes mellitus with other diabetic kidney complication: Secondary | ICD-10-CM | POA: Diagnosis not present

## 2023-10-27 DIAGNOSIS — Z79899 Other long term (current) drug therapy: Secondary | ICD-10-CM | POA: Diagnosis not present

## 2023-10-27 DIAGNOSIS — N1831 Chronic kidney disease, stage 3a: Secondary | ICD-10-CM | POA: Diagnosis not present

## 2023-10-30 ENCOUNTER — Ambulatory Visit: Payer: Medicare PPO | Attending: Cardiology | Admitting: *Deleted

## 2023-10-30 DIAGNOSIS — Z952 Presence of prosthetic heart valve: Secondary | ICD-10-CM

## 2023-10-30 DIAGNOSIS — Z5181 Encounter for therapeutic drug level monitoring: Secondary | ICD-10-CM

## 2023-10-30 LAB — POCT INR: INR: 3 (ref 2.0–3.0)

## 2023-10-30 NOTE — Patient Instructions (Signed)
Continue warfarin 1 tablet daily except 1 1/2 tablets on Sundays and Thursdays Recheck INR in 6 weeks

## 2023-11-03 DIAGNOSIS — N1831 Chronic kidney disease, stage 3a: Secondary | ICD-10-CM | POA: Diagnosis not present

## 2023-11-03 DIAGNOSIS — I5032 Chronic diastolic (congestive) heart failure: Secondary | ICD-10-CM | POA: Diagnosis not present

## 2023-11-03 DIAGNOSIS — E1122 Type 2 diabetes mellitus with diabetic chronic kidney disease: Secondary | ICD-10-CM | POA: Diagnosis not present

## 2023-11-20 ENCOUNTER — Other Ambulatory Visit: Payer: Self-pay | Admitting: Cardiology

## 2023-11-20 DIAGNOSIS — I4891 Unspecified atrial fibrillation: Secondary | ICD-10-CM

## 2023-11-20 DIAGNOSIS — I639 Cerebral infarction, unspecified: Secondary | ICD-10-CM

## 2023-11-20 DIAGNOSIS — Z952 Presence of prosthetic heart valve: Secondary | ICD-10-CM

## 2023-11-20 NOTE — Telephone Encounter (Signed)
Warfarin 2.5mg  refill Status post aortic valve and mitral valve replacement [Z95.2]  Atrial fibrillation (HCC) [I48.91]  Cardioembolic stroke (HCC) [I63.9]  Last INR 10/30/23 Last OV  07/10/23

## 2023-11-27 ENCOUNTER — Encounter (INDEPENDENT_AMBULATORY_CARE_PROVIDER_SITE_OTHER): Payer: Self-pay | Admitting: Otolaryngology

## 2023-11-27 ENCOUNTER — Ambulatory Visit (INDEPENDENT_AMBULATORY_CARE_PROVIDER_SITE_OTHER): Payer: Medicare PPO | Admitting: Otolaryngology

## 2023-11-27 VITALS — BP 126/73 | HR 78 | Ht 62.0 in | Wt 166.0 lb

## 2023-11-27 DIAGNOSIS — R0989 Other specified symptoms and signs involving the circulatory and respiratory systems: Secondary | ICD-10-CM

## 2023-11-27 DIAGNOSIS — K219 Gastro-esophageal reflux disease without esophagitis: Secondary | ICD-10-CM | POA: Diagnosis not present

## 2023-11-27 DIAGNOSIS — R0982 Postnasal drip: Secondary | ICD-10-CM

## 2023-11-27 DIAGNOSIS — R0981 Nasal congestion: Secondary | ICD-10-CM | POA: Diagnosis not present

## 2023-11-27 DIAGNOSIS — J342 Deviated nasal septum: Secondary | ICD-10-CM | POA: Diagnosis not present

## 2023-11-27 DIAGNOSIS — J343 Hypertrophy of nasal turbinates: Secondary | ICD-10-CM

## 2023-11-27 DIAGNOSIS — R053 Chronic cough: Secondary | ICD-10-CM | POA: Diagnosis not present

## 2023-11-27 DIAGNOSIS — R09A2 Foreign body sensation, throat: Secondary | ICD-10-CM | POA: Diagnosis not present

## 2023-11-27 DIAGNOSIS — J3089 Other allergic rhinitis: Secondary | ICD-10-CM

## 2023-11-27 NOTE — Patient Instructions (Addendum)
 [name] Med Nasal Saline Rinse   - start nasal saline rinses with [employer]le available over the counter or online to help with nasal congestion    Continue Flonase twice daily Continue Allegra Continue Protonix daily Try reflux gourmet   [email or web address] - check out this website to learn more about reflux   -Avoid lying down for at least two hours after a meal or after drinking acidic beverages, like soda, or other caffeinated beverages. This can help to prevent stomach contents from flowing back into the esophagus. -Keep your head elevated while you sleep. Using an extra pillow or two can also help to prevent reflux. -Eat smaller and more frequent meals each day instead of a few large meals. This promotes digestion and can aid in preventing heartburn. -Wear loose-fitting clothes to ease pressure on the stomach, which can worsen heartburn and reflux. -Reduce excess weight around the midsection. This can ease pressure on the stomach. Such pressure can force some stomach contents back up the esophagus  - Take Reflux Gourmet (natural supplement available on Amazon) to help with symptoms of chronic throat irritation

## 2023-11-27 NOTE — Progress Notes (Signed)
 ENT CONSULT:  Reason for Consult: globus sensation and phlegm throat throat clearing  HPI: Discussed the use of AI scribe software for clinical note transcription with the patient, who gave verbal consent to proceed.  History of Present Illness   [name] is a 78 year old female with hx of COPD who presents with a sensation of phlegm in her throat/chronic cough and post-nasal drainage/chronic nasal congestion. She is accompanied by a caregiver who assists in providing information. She was referred by a pulmonary physician Dr Sherene Sires.  She experiences a sensation of phlegm stuck in her throat, which she is unable to clear. The phlegm is described as thick and is particularly bothersome in the morning upon waking and again around bedtime. She has episodes of coughing and snorting for up to thirty minutes in an attempt to clear her throat, with minimal success. No consistent trouble with swallowing. No changes to her voice have been noted.  She has a history of allergies, with positive results for pine pollen and mold. [place] She is currently taking Allegra and Flonase daily for postnasal drainage. Significant coughing produces white phlegm, which may be related to postnasal drip.  She has a history of COPD and is currently using Symbicort as part of her treatment regimen. She denies being a smoker. She also has a history of stroke approximately ten to twelve years ago, with some memory deficits reported. She has a history of breast cancer treated with lumpectomy and radiation in 1994.  She is on gabapentin, which she suspects might be contributing to her symptoms, though she is uncertain. She is also taking Coumadin as a blood thinner to prevent strokes and is on Protonix for reflux.      Records Reviewed:  09/21/23 office visit with Dr Sherene Sires Pulmonary  29  yowf never smoker with a h/o sob related to asthma and valvular heart dz s/p MVR and  AVR 2014   Seen prior to Epic in [place]: Feb 20, 2009 initial pulmonary eval started aerobics 3 months prior to OV  X 30 min weekly and tolerated well with improving ex tol until 3 weeks prior to OV   noted decrease tolerance and since then gradually worse to point where walking to mailbox slt uphill or climbing steps has to stop at top.  In retrospect coughing for 3 months dry, worse immediately on lying down. due to the cough lisnopril was stopped and could not tolerate the substitute and cough hasn't changed but breathing got worse. prednisone no help so far and proaire no benefit. rec GERD diet   continue prilosec and reglan stop prednisone and proaire  avoid salt  maxzide 25 one each am and Dr Marvel Plan office      Past Medical History:  Diagnosis Date   Anemia    Asthma    Atrial fibrillation (HCC)    Breast carcinoma (HCC) 1995   1995   Cardioembolic stroke (HCC) 01/2012   Right frontal in 01/2012; normal carotid ultrasound; possible LAA thrombus by TEE; virtual complete neurologic recovery   Chronic kidney disease, stage 2, mildly decreased GFR    GFR of approximately 60   Depression    Diabetes mellitus without complication (HCC)    Diverticulosis of colon (without mention of hemorrhage) 2012   Dr. Karilyn Cota   Fasting hyperglycemia    120 fasting   Gastroesophageal reflux disease    Gastroparesis    Hemorrhoids    Hyperlipidemia  Hypertension    pt denies 05/30/13     Dr Hinton Rao   Hyponatremia    Rheumatic heart disease    a. 07/2013 Echo: Ef 55-60%, Mod AS/AI, Mod-Sev MS, sev dil LA, PASP 58;  b. 07/2013 TEE: EF 35-40%, mild-mod AS/AI, mod-sev MS, LA smoke;  c. 07/2013 Cath: elev R heart pressures, nl cors.   Shortness of breath     Past Surgical History:  Procedure Laterality Date   AORTIC VALVE REPLACEMENT N/A 07/18/2013   Procedure: AORTIC VALVE REPLACEMENT (AVR);  Surgeon: [name], MD;  Location: [facility] OR;  Service: Open Heart Surgery;  Laterality:  N/A;   BACK SURGERY     BREAST LUMPECTOMY Right 1995   CARDIAC CATHETERIZATION     CARDIOVERSION N/A 07/26/2013   Procedure: CARDIOVERSION;  Surgeon: Pricilla Riffle, MD;  Location: [facility] ENDOSCOPY;  Service: Cardiovascular;  Laterality: N/A;   CARDIOVERSION N/A 01/04/2023   Procedure: CARDIOVERSION;  Surgeon: Meriam Sprague, MD;  Location: [facility] ENDOSCOPY;  Service: Cardiovascular;  Laterality: N/A;   COLONOSCOPY  2012   Negative screening procedure   DILATION AND CURETTAGE OF UTERUS     ESOPHAGEAL MANOMETRY N/A 06/17/2013   Procedure: ESOPHAGEAL MANOMETRY (EM);  Surgeon: Mardella Layman, MD;  Location: [facility] ENDOSCOPY;  Service: Endoscopy;  Laterality: N/A;   INTRAOPERATIVE TRANSESOPHAGEAL ECHOCARDIOGRAM N/A 07/18/2013   Procedure: INTRAOPERATIVE TRANSESOPHAGEAL ECHOCARDIOGRAM;  Surgeon: [name], MD;  Location: [facility] OR;  Service: Open Heart Surgery;  Laterality: N/A;   LEFT AND RIGHT HEART CATHETERIZATION WITH CORONARY ANGIOGRAM N/A 07/10/2013   Procedure: LEFT AND RIGHT HEART CATHETERIZATION WITH CORONARY ANGIOGRAM;  Surgeon: Kathleene Hazel, MD;  Location: [facility] CATH LAB;  Service: Cardiovascular;  Laterality: N/A;   LUMBAR LAMINECTOMY/DECOMPRESSION MICRODISCECTOMY Right 05/13/2014   Procedure: LUMBAR LAMINECTOMY/DECOMPRESSION MICRODISCECTOMY 1 LEVEL  lumbar four/five;  Surgeon: Reinaldo Meeker, MD;  Location: [facility] NEURO ORS;  Service: Neurosurgery;  Laterality: Right;   MITRAL VALVE REPLACEMENT N/A 07/18/2013   Procedure: MITRAL VALVE (MV) REPLACEMENT;  Surgeon: [name], MD;  Location: [facility] OR;  Service: Open Heart Surgery;  Laterality: N/A;   MITRAL VALVE SURGERY  [facility], closed mitral valvulotomy by finger fracture   TEE WITHOUT CARDIOVERSION  01/24/2012   Procedure: TRANSESOPHAGEAL ECHOCARDIOGRAM (TEE);  Surgeon: Lewayne Bunting, MD;  Location: [facility] ENDOSCOPY;  Service: Cardiovascular;  Laterality: N/A;   TEE WITHOUT CARDIOVERSION N/A 07/11/2013   Procedure: TRANSESOPHAGEAL  ECHOCARDIOGRAM (TEE);  Surgeon: Vesta Mixer, MD;  Location: [facility] ENDOSCOPY;  Service: Cardiovascular;  Laterality: N/A;   TEE WITHOUT CARDIOVERSION N/A 07/26/2013   Procedure: TRANSESOPHAGEAL ECHOCARDIOGRAM (TEE);  Surgeon: Pricilla Riffle, MD;  Location: [facility] ENDOSCOPY;  Service: Cardiovascular;  Laterality: N/A;   TUBAL LIGATION  1973    Family History  Problem Relation Age of Onset   Heart disease Brother 44       MI   Rheum arthritis Maternal Grandmother    Asthma Maternal Grandfather    Hypothyroidism Mother    Diabetes Sister    Cirrhosis Father     Social History:  reports that she has never smoked. She has never used smokeless tobacco. She reports that she does not drink alcohol and does not use drugs.  Allergies:  Allergies  Allergen Reactions   Amiodarone Rash    Medications: I have reviewed the patient's current medications.  The PMH, PSH, Medications, Allergies, and SH were reviewed and updated.  ROS: Constitutional: Negative for fever, weight loss  and weight gain. Cardiovascular: Negative for chest pain and dyspnea on exertion. Respiratory: Is not experiencing shortness of breath at rest. Gastrointestinal: Negative for nausea and vomiting. Neurological: Negative for headaches. Psychiatric: The patient is not nervous/anxious  Blood pressure 126/73, pulse 78, height 5\' 2"  (1.575 m), weight 166 lb (75.3 kg), SpO2 (!) 89%. Body mass index is 30.36 kg/m.  PHYSICAL EXAM:  Exam: General: Well-developed, well-nourished Communication and Voice: raspy Respiratory Respiratory effort: Equal inspiration and expiration without stridor Cardiovascular Peripheral Vascular: Warm extremities with equal color/perfusion Eyes: No nystagmus with equal extraocular motion bilaterally Neuro/Psych/Balance: Patient oriented to person, place, and time; Appropriate mood and affect; Gait is intact with no imbalance; Cranial nerves I-XII are intact Head and Face Inspection:  Normocephalic and atraumatic without mass or lesion Palpation: Facial skeleton intact without bony stepoffs Salivary Glands: No mass or tenderness Facial Strength: Facial motility symmetric and full bilaterally ENT Pinna: External ear intact and fully developed External canal: Canal is patent with intact skin Tympanic Membrane: Clear and mobile External Nose: No scar or anatomic deformity Internal Nose: Septum is S-shaped and deviated with narrowing of the right > left nasal passages. No polyp, or purulence. Mucosal edema and erythema present.  Bilateral inferior turbinate hypertrophy.  Lips, Teeth, and gums: Mucosa and teeth intact and viable TMJ: No pain to palpation with full mobility Oral cavity/oropharynx: No erythema or exudate, no lesions present Nasopharynx: No mass or lesion with intact mucosa Hypopharynx: Intact mucosa without pooling of secretions Larynx Glottic: Full true vocal cord mobility without lesion or mass Supraglottic: Normal appearing epiglottis and AE folds Interarytenoid Space: Moderate pachydermia&edema Subglottic Space: Patent without lesion or edema Neck Neck and Trachea: Midline trachea without mass or lesion Thyroid: No mass or nodularity Lymphatics: No lymphadenopathy  Procedure: Preoperative diagnosis:  Postoperative diagnosis:   Same  Procedure: Flexible fiberoptic laryngoscopy  Surgeon: Ashok Croon, MD  Anesthesia: Topical lidocaine and Afrin Complications: None Condition is stable throughout exam  Indications and consent:  The patient presents to the clinic with above symptoms. Indirect laryngoscopy view was incomplete. Thus it was recommended that they undergo a flexible fiberoptic laryngoscopy. All of the risks, benefits, and potential complications were reviewed with the patient preoperatively and verbal informed consent was obtained.  Procedure: The patient was seated upright in the clinic. Topical lidocaine and Afrin were applied to  the nasal cavity. After adequate anesthesia had occurred, I then proceeded to pass the flexible telescope into the nasal cavity. The nasal cavity was patent without rhinorrhea or polyp. The nasopharynx was also patent without mass or lesion. The base of tongue was visualized and was normal. There were no signs of pooling of secretions in the piriform sinuses. The true vocal folds were mobile bilaterally. There were no signs of glottic or supraglottic mucosal lesion or mass. There was moderate interarytenoid pachydermia and post cricoid edema. The telescope was then slowly withdrawn and the patient tolerated the procedure throughout.    PROCEDURE NOTE: nasal endoscopy  Preoperative diagnosis: chronic nasal congestion symptoms  Postoperative diagnosis: same  Procedure: Diagnostic nasal endoscopy (16109)  Surgeon: Ashok Croon, M.D.  Anesthesia: Topical lidocaine and Afrin  H&P REVIEW: The patient's history and physical were reviewed today prior to procedure. All medications were reviewed and updated as well. Complications: None Condition is stable throughout exam Indications and consent: The patient presents with symptoms of chronic sinusitis not responding to previous therapies. All the risks, benefits, and potential complications were reviewed with the patient preoperatively and informed consent  was obtained. The time out was completed with confirmation of the correct procedure.   Procedure: The patient was seated upright in the clinic. Topical lidocaine and Afrin were applied to the nasal cavity. After adequate anesthesia had occurred, the rigid nasal endoscope was passed into the nasal cavity. The nasal mucosa, turbinates, septum, and sinus drainage pathways were visualized bilaterally. This revealed no purulence or significant secretions that might be cultured. There were no polyps or sites of significant inflammation. The mucosa was intact and there was no crusting present. The scope was  then slowly withdrawn and the patient tolerated the procedure well. There were no complications or blood loss.   Studies Reviewed: CT head w/con 11/11/22 1. No acute intracranial abnormality. Unchanged encephalomalacia in the right superior frontal gyrus, likely sequela of prior infarct or trauma. 2. No acute cervical spine fracture or traumatic listhesis.  Paranasal sinuses are clear - limited exam  Assessment/Plan: Encounter Diagnoses  Name Primary?   Chronic GERD    Chronic nasal congestion    Environmental and seasonal allergies    Hypertrophy of both inferior nasal turbinates    Nasal septal deviation    Post-nasal drip    Globus sensation    Chronic cough    Chronic throat clearing Yes    Assessment and Plan    Globus Sensation throat clearing and chronic cough Sensation of phlegm stuck in throat, worse in the morning and around bedtime. No masses or abnormalities on scope exam VF are mobile. Postnasal drainage and findings c/w GERD LPR were also noted on exam. Allergies managed with Allegra and Flonase. Discussed nasal saline rinses with NeilMed bottle for better efficacy than ocean spray. Emphasized proper use of Flonase after rinses and aiming laterally. Consider retesting for allergies if symptoms persist and willing to start allergy shots. - Recommend nasal saline rinses with NeilMed bottle - Continue Flonase after nasal rinses, aiming laterally BID b/l nares 2 puffs - Continue Allegra at current dose - Consider retesting for allergies if symptoms persist and willing to start allergy shots  Chronic Cough Chronic cough with white phlegm production, likely related to postnasal drainage and possibly reflux. No significant findings on scope exam. Not on reflux medication. Discussed continuing Protonix for reflux management. Considered using a supplement Reflux Gourmet - continue Protonix for reflux management at current dose -  Reflux Gourmet after meals - diet and  lifestyle changes to minimize GERD - Refer to [employer] blog for dietary and lifestyle modifications/reflux cook book   Chronic Obstructive Pulmonary Disease (COPD) COPD managed with Symbicort. No acute exacerbation noted. Advised to continue current COPD management and follow up with pulmonary physician. - Continue Symbicort - Follow up with pulmonary physician  General Health Maintenance Stroke and breast cancer. On Coumadin for stroke prevention. - Continue Coumadin for stroke prevention  Follow-up - Provide after-visit summary with instructions and recommendations - Follow up with primary care and pulmonary physician as needed.          Thank you for allowing me to participate in the care of this patient. Please do not hesitate to contact me with any questions or concerns.   Ashok Croon, MD Otolaryngology [facility] Phone: [phone] Fax: [phone]    11/27/2023, 9:59 PM

## 2023-11-29 ENCOUNTER — Telehealth: Payer: Self-pay | Admitting: Cardiology

## 2023-11-29 NOTE — Telephone Encounter (Signed)
*  STAT* If patient is at the pharmacy, call can be transferred to refill team.   1. Which medications need to be refilled? (please list name of each medication and dose if known)  torsemide (DEMADEX) 20 MG tablet  2. Which pharmacy/location (including street and city if local pharmacy) is medication to be sent to?  [facility] - [place], [place] - [id number]  3. Do they need a 30 day or 90 day supply? Pt has ran out early due to having to take more for swelling in her ankles. Pharm will not fill until March 10th. Requesting cb to discuss

## 2023-11-29 NOTE — Telephone Encounter (Signed)
 Spoke with husband regarding dosage of Torsemide. Currently taking 20 mg daily but takes additional for swelling. Stated that patient's ankles have been swelling right is worse. She does elevate them . She took the additional dosage more when the bad weather came in (4 days) last week. Hasn't taken any this week as of yet. Tried walking today about 45 minutes had to sit down due to SOB. Checked her BP 133/73  and has been running in the low 130s. O2 levels have been in the 90s as well. He really didn't know how accurate her fluid intake was, but does know she does drink her enough daily. Salt intake-she does use right much salt if she cooks, if he cooks he doesn't. He is concerned that she will not have enough for her to last until 03/10. Please advise

## 2023-12-03 ENCOUNTER — Encounter: Payer: Self-pay | Admitting: Cardiology

## 2023-12-04 MED ORDER — TORSEMIDE 20 MG PO TABS
20.0000 mg | ORAL_TABLET | Freq: Every day | ORAL | 6 refills | Status: DC
Start: 2023-12-04 — End: 2024-04-12

## 2023-12-04 NOTE — Telephone Encounter (Signed)
 Spoke to pt's husband who verbalized understanding. Medication refill sent to [facility] pharmacy per husbands request.

## 2023-12-04 NOTE — Telephone Encounter (Signed)
 Can send in a refill for the torsemide at 20mg  daily take additional 20mg  prn  Dominga Ferry MD

## 2023-12-05 ENCOUNTER — Ambulatory Visit: Attending: Internal Medicine

## 2023-12-05 VITALS — BP 110/60 | HR 96 | Wt 167.0 lb

## 2023-12-05 DIAGNOSIS — I10 Essential (primary) hypertension: Secondary | ICD-10-CM

## 2023-12-05 NOTE — Progress Notes (Signed)
 Patient here today for nurse visit per [facility] for EKG and vitals  Patient states she is really tired and SOB and coughing and very weak  No CP  Patient husband states  he has been giving her extra torsemide 40 mg daily for about 6 days now for swelling.  Yesterday weighing 166.4lb at home  Today 167 fully clothed  BP today 110/60  HR 96  SPO2 96   EKG preformed   Sent to Dr.Branch for advice

## 2023-12-06 NOTE — Progress Notes (Signed)
 Weights are not significantly up, vitals are normal. Can she see me this coming up Tuesday at 220pm in [place]. If severe symptoms prior needs to go to ER  Dominga Ferry MD

## 2023-12-07 ENCOUNTER — Telehealth: Payer: Self-pay | Admitting: Cardiology

## 2023-12-07 NOTE — Telephone Encounter (Signed)
 Patient informed and verbalized understanding of plan. Patient spouse accepted the appointment for 220 on Tuesday in [place] will send to be scheduled

## 2023-12-07 NOTE — Telephone Encounter (Signed)
-----   Message from Dina Rich sent at 12/06/2023  9:52 AM EST -----    ----- Message ----- From: Sharen Hones Sent: 12/05/2023   8:36 AM EST To: Antoine Poche, MD

## 2023-12-11 ENCOUNTER — Ambulatory Visit: Payer: Medicare PPO | Attending: Cardiology | Admitting: *Deleted

## 2023-12-11 DIAGNOSIS — Z5181 Encounter for therapeutic drug level monitoring: Secondary | ICD-10-CM | POA: Diagnosis not present

## 2023-12-11 DIAGNOSIS — Z952 Presence of prosthetic heart valve: Secondary | ICD-10-CM

## 2023-12-11 LAB — POCT INR: INR: 2.4 (ref 2.0–3.0)

## 2023-12-11 NOTE — Patient Instructions (Signed)
 Take warfarin 1 1/2 tablets tonight then resume 1 tablet daily except 1 1/2 tablets on Sundays and Thursdays Recheck INR in 5 weeks

## 2023-12-12 ENCOUNTER — Encounter: Payer: Self-pay | Admitting: Cardiology

## 2023-12-12 ENCOUNTER — Other Ambulatory Visit (HOSPITAL_COMMUNITY)
Admission: RE | Admit: 2023-12-12 | Discharge: 2023-12-12 | Disposition: A | Discharge status: Home / Self Care | Source: Ambulatory Visit | Attending: Cardiology | Admitting: Cardiology

## 2023-12-12 ENCOUNTER — Ambulatory Visit: Attending: Cardiology | Admitting: Cardiology

## 2023-12-12 VITALS — BP 126/74 | HR 96 | Ht 62.5 in | Wt 168.2 lb

## 2023-12-12 DIAGNOSIS — R0602 Shortness of breath: Secondary | ICD-10-CM | POA: Insufficient documentation

## 2023-12-12 DIAGNOSIS — R1310 Dysphagia, unspecified: Secondary | ICD-10-CM

## 2023-12-12 LAB — CBC
HCT: 40.6 % (ref 36.0–46.0)
Hemoglobin: 12.3 g/dL (ref 12.0–15.0)
MCH: 26.2 pg (ref 26.0–34.0)
MCHC: 30.3 g/dL (ref 30.0–36.0)
MCV: 86.6 fL (ref 80.0–100.0)
Platelets: 187 10*3/uL (ref 150–400)
RBC: 4.69 MIL/uL (ref 3.87–5.11)
RDW: 15.6 % — ABNORMAL HIGH (ref 11.5–15.5)
WBC: 7.6 10*3/uL (ref 4.0–10.5)
nRBC: 0 % (ref 0.0–0.2)

## 2023-12-12 LAB — BRAIN NATRIURETIC PEPTIDE: B Natriuretic Peptide: 431 pg/mL — ABNORMAL HIGH (ref 0.0–100.0)

## 2023-12-12 LAB — BASIC METABOLIC PANEL
Anion gap: 12 (ref 5–15)
BUN: 15 mg/dL (ref 8–23)
CO2: 26 mmol/L (ref 22–32)
Calcium: 9.2 mg/dL (ref 8.9–10.3)
Chloride: 98 mmol/L (ref 98–111)
Creatinine, Ser: 1.57 mg/dL — ABNORMAL HIGH (ref 0.44–1.00)
GFR, Estimated: 33 mL/min — ABNORMAL LOW (ref >60)
Glucose, Bld: 142 mg/dL — ABNORMAL HIGH (ref 70–99)
Potassium: 3.8 mmol/L (ref 3.5–5.1)
Sodium: 136 mmol/L (ref 135–145)

## 2023-12-12 LAB — TSH: TSH: 1.708 u[IU]/mL (ref 0.350–4.500)

## 2023-12-12 LAB — MAGNESIUM: Magnesium: 1.9 mg/dL (ref 1.7–2.4)

## 2023-12-12 NOTE — Progress Notes (Signed)
 Clinical Summary Ms. Babich is a 78 y.o.female seen today for follow up of the following medical problems.   1. Valvular heart disease   - 07/18/13 patient underwent MVR with 25 mm Edwards Magna-ease pericardial valve and also AVR with 21 mm Edwards Magna-ease pericardial valve    -03/2022 echo: LVEF 60%, indet diastolic, normal MVR and AVR.   - no recent SOB, has chronic cough.  - mild LE edema at times - home weights 166-167 lbs.      11/2022 echo: LVEF 55-60%, indet diastolic fxn, mod pulm HTN, normal MVR and AVR  - sigifnicant change since Christmas - significant increase DOE. Up until walking 1/3 mile comfortable, now DOE just walking to mail box - some chest tightness at times. Midchest, 5/10 in severity. Not positional. Can last few hours constant. - mild increase in LE edema, taking extra torsemide.  - increased cough, no wheezing. Nonproductive cough - home weights 167 lbs.  - palpitations at times with activity, not at reast.     Other medical problems not addressed this visit   2.SOB -admitted 03/2022 with SOB, hypoxia - from notes O2 sats down to 89% on room air.  She reports dyspnea and cough.  Portable chest x-ray suggesting right base atelectasis or developing infection, also loculated pleural fluid in right lateral lung slightly increased from prior.  Also possible component of congestive heart failure. - 03/2022 echo: LVEF 60%, indet diastolic, normal MVR and AVR.  - BNP 297     - intermittent SOB at home, sats will get down to mid 80s - reports takes additional lasix and symptoms and O2 sats improve     - last 09/02/22 visit we changed lasix to torsemide 40mg  bid.  - Cr uptrend from 0.8 to 1.18, BNP 118 - with diuretic change SOB mildly improved, now drop in O2 sats are fairly infrequent. She reports breathing has improved.        3. HTN - she is compliantw ith meds   4. Afib/aflutter - hx of cardioembolic CVA, has been on longterm anticoag --  no recent palpitations - no bleeding on coumadin    - has had some palpitations, 1-2 times per day. Lasts just a few seconds   - DCCV 01/04/23 - from Dr Morrie Sheldon notes if recurent aflutter consider amiodarone. -       4. Hyperlipidemia - recent labs with pcp, compliant with stin   06/2021 TC 185 TG 75 HDL 66 LDL 104 01/2022 TC 177 TG 133 HDL 58 LDL 96   5. Carotid stenosis - 07/2013 [place] with mild bilateral stenosis 2018 mild disease - 01/2021 [place] mild bilateral disease - 11/2022 mild bilateral disease        6. Obstructive lung disease 2014 PFTs moderate obstruction     7. Syncope -admit 11/2022, from notes intense shoulder pain which triggered syncope suggesting vasovagal etiology. Had low bp and HRs Past Medical History:  Diagnosis Date   Anemia    Asthma    Atrial fibrillation (HCC)    Breast carcinoma (HCC) 1995   1995   Cardioembolic stroke (HCC) 01/2012   Right frontal in 01/2012; normal carotid ultrasound; possible LAA thrombus by TEE; virtual complete neurologic recovery   Chronic kidney disease, stage 2, mildly decreased GFR    GFR of approximately 60   Depression    Diabetes mellitus without complication (HCC)    Diverticulosis of colon (without mention of hemorrhage) 2012   Dr. Karilyn Cota  Fasting hyperglycemia    120 fasting   Gastroesophageal reflux disease    Gastroparesis    Hemorrhoids    Hyperlipidemia    Hypertension    pt denies 05/30/13     Dr Hinton Rao   Hyponatremia    Rheumatic heart disease    a. 07/2013 Echo: Ef 55-60%, Mod AS/AI, Mod-Sev MS, sev dil LA, PASP 58;  b. 07/2013 TEE: EF 35-40%, mild-mod AS/AI, mod-sev MS, LA smoke;  c. 07/2013 Cath: elev R heart pressures, nl cors.   Shortness of breath      Allergies  Allergen Reactions   Amiodarone Rash     Current Outpatient Medications  Medication Sig Dispense Refill   albuterol (PROVENTIL) (2.5 MG/3ML) 0.083% nebulizer solution USE 1 VIAL IN NEBULIZER EVERY4 HOURS AS NEEDED. 180  mL 5   albuterol (VENTOLIN HFA) 108 (90 Base) MCG/ACT inhaler Inhale 2 puffs into the lungs every 6 (six) hours as needed for wheezing or shortness of breath.     atorvastatin (LIPITOR) 40 MG tablet TAKE ONE TABLET BY MOUTH ONCE DAILY. 90 tablet 3   bisoprolol (ZEBETA) 10 MG tablet Take 1 tablet (10 mg total) by mouth daily.     budesonide-formoterol (SYMBICORT) 80-4.5 MCG/ACT inhaler Take 2 puffs first thing in am and then another 2 puffs about 12 hours later. 1 each 12   dextromethorphan-guaiFENesin (MUCINEX DM) 30-600 MG 12hr tablet Take 1 tablet by mouth 2 (two) times daily as needed for cough.     escitalopram (LEXAPRO) 10 MG tablet Take 10 mg by mouth daily with breakfast.     famotidine (PEPCID) 20 MG tablet TAKE 1 TABLET AFTER SUPPER 30 tablet 1   FARXIGA 10 MG TABS tablet Take 10 mg by mouth daily.     fexofenadine (ALLEGRA) 180 MG tablet Take 180 mg by mouth in the morning.     fluticasone (FLONASE) 50 MCG/ACT nasal spray Place 1 spray into both nostrils daily.     gabapentin (NEURONTIN) 300 MG capsule Take 1 capsule (300 mg total) by mouth 3 (three) times daily. 270 capsule 3   levothyroxine (SYNTHROID) 100 MCG tablet Take 100 mcg by mouth daily before breakfast.     metFORMIN (GLUCOPHAGE) 500 MG tablet Take 1 tablet (500 mg total) by mouth 2 (two) times daily with a meal. 60 tablet 1   Multiple Vitamin (MULITIVITAMIN WITH MINERALS) TABS Take 1 tablet by mouth daily.     pantoprazole (PROTONIX) 40 MG tablet TAKE 1 TABLET 30 TO 60 MINUTES BEFORE FIRST MEAL OF THE DAY. 90 tablet 1   potassium chloride SA (KLOR-CON M) 20 MEQ tablet Take 1 tablet (20 mEq total) by mouth daily. 30 tablet 0   torsemide (DEMADEX) 20 MG tablet Take 1 tablet (20 mg total) by mouth daily. (May take an additional 20mg  as needed for swelling) 45 tablet 6   warfarin (COUMADIN) 2.5 MG tablet TAKE AS DIRECTED BY COUMADIN CLINIC. 40 tablet 3   No current facility-administered medications for this visit.     Past  Surgical History:  Procedure Laterality Date   AORTIC VALVE REPLACEMENT N/A 07/18/2013   Procedure: AORTIC VALVE REPLACEMENT (AVR);  Surgeon: [name], MD;  Location: [facility] OR;  Service: Open Heart Surgery;  Laterality: N/A;   BACK SURGERY     BREAST LUMPECTOMY Right 1995   CARDIAC CATHETERIZATION     CARDIOVERSION N/A 07/26/2013   Procedure: CARDIOVERSION;  Surgeon: Pricilla Riffle, MD;  Location: [facility] ENDOSCOPY;  Service: Cardiovascular;  Laterality: N/A;   CARDIOVERSION N/A 01/04/2023   Procedure: CARDIOVERSION;  Surgeon: Meriam Sprague, MD;  Location: [facility] ENDOSCOPY;  Service: Cardiovascular;  Laterality: N/A;   COLONOSCOPY  2012   Negative screening procedure   DILATION AND CURETTAGE OF UTERUS     ESOPHAGEAL MANOMETRY N/A 06/17/2013   Procedure: ESOPHAGEAL MANOMETRY (EM);  Surgeon: Mardella Layman, MD;  Location: [facility] ENDOSCOPY;  Service: Endoscopy;  Laterality: N/A;   INTRAOPERATIVE TRANSESOPHAGEAL ECHOCARDIOGRAM N/A 07/18/2013   Procedure: INTRAOPERATIVE TRANSESOPHAGEAL ECHOCARDIOGRAM;  Surgeon: [name], MD;  Location: [facility] OR;  Service: Open Heart Surgery;  Laterality: N/A;   LEFT AND RIGHT HEART CATHETERIZATION WITH CORONARY ANGIOGRAM N/A 07/10/2013   Procedure: LEFT AND RIGHT HEART CATHETERIZATION WITH CORONARY ANGIOGRAM;  Surgeon: Kathleene Hazel, MD;  Location: [facility] CATH LAB;  Service: Cardiovascular;  Laterality: N/A;   LUMBAR LAMINECTOMY/DECOMPRESSION MICRODISCECTOMY Right 05/13/2014   Procedure: LUMBAR LAMINECTOMY/DECOMPRESSION MICRODISCECTOMY 1 LEVEL  lumbar four/five;  Surgeon: Reinaldo Meeker, MD;  Location: [facility] NEURO ORS;  Service: Neurosurgery;  Laterality: Right;   MITRAL VALVE REPLACEMENT N/A 07/18/2013   Procedure: MITRAL VALVE (MV) REPLACEMENT;  Surgeon: [name], MD;  Location: [facility] OR;  Service: Open Heart Surgery;  Laterality: N/A;   MITRAL VALVE SURGERY  [facility], closed mitral valvulotomy by finger fracture   TEE WITHOUT CARDIOVERSION  01/24/2012    Procedure: TRANSESOPHAGEAL ECHOCARDIOGRAM (TEE);  Surgeon: Lewayne Bunting, MD;  Location: [facility] ENDOSCOPY;  Service: Cardiovascular;  Laterality: N/A;   TEE WITHOUT CARDIOVERSION N/A 07/11/2013   Procedure: TRANSESOPHAGEAL ECHOCARDIOGRAM (TEE);  Surgeon: Vesta Mixer, MD;  Location: [facility] ENDOSCOPY;  Service: Cardiovascular;  Laterality: N/A;   TEE WITHOUT CARDIOVERSION N/A 07/26/2013   Procedure: TRANSESOPHAGEAL ECHOCARDIOGRAM (TEE);  Surgeon: Pricilla Riffle, MD;  Location: [facility] ENDOSCOPY;  Service: Cardiovascular;  Laterality: N/A;   TUBAL LIGATION  1973     Allergies  Allergen Reactions   Amiodarone Rash      Family History  Problem Relation Age of Onset   Heart disease Brother 68       MI   Rheum arthritis Maternal Grandmother    Asthma Maternal Grandfather    Hypothyroidism Mother    Diabetes Sister    Cirrhosis Father      Social History Ms. Dollinger reports that she has never smoked. She has never used smokeless tobacco. Ms. Taira reports no history of alcohol use.     Physical Examination Today's Vitals   12/12/23 1416  BP: 126/74  Pulse: 96  SpO2: 92%  Weight: 168 lb 3.2 oz (76.3 kg)  Height: 5' 2.5" (1.588 m)   Body mass index is 30.27 kg/m.  Gen: resting comfortably, no acute distress HEENT: no scleral icterus, pupils equal round and reactive, no palptable cervical adenopathy,  CV: regular, no mrg, no jvd Resp: Clear to auscultation bilaterally GI: abdomen is soft, non-tender, non-distended, normal bowel sounds, no hepatosplenomegaly MSK: extremities are warm, no edema.  Skin: warm, no rash Neuro:  no focal deficits Psych: appropriate affect   Diagnostic Studies  07/2013 TTE Study Conclusions  - Study data: Technically difficult study. - Left ventricle: The cavity size was normal. Wall thickness   was normal. Systolic function was normal. The estimated   ejection fraction was in the range of 55% to 60%.   Indeterminate diastolic function.  There is evidence of   very high left atrial pressures (E/e' 63) - Aortic valve: Trileaflet; mildly thickened leaflets.   Accurate evaluation of  aortic stenosis limited in the   setting of severe mitral stenosis and decreased   ventricular loadin. Morphologically there does not appear   to be significant stenosis. By gradient (mean PG 21 mmHg)   the stenosis is moderate, by AVA VTI (0.8) stenosis is   severe. Moderate regurgitation, AR PHT is 439 cm/s. Valve   area: 0.78cm^2(VTI). Valve area: 0.78cm^2 (Vmax). - Mitral valve: The findings are consistent with moderate to   severe stenosis. (Mean PG 12 mmHg, MVA by PHT 1.3. Cannot   use MVA by VTI due to moderate AI. Valve area by pressure   half-time: 1.38cm^2. - Left atrium: The atrium was severely dilated. - Right ventricle: Systolic function was mildly reduced, RV   TAPSE is 1.4, TV systolic annular tissue velocity 10 cm/s. - Pulmonary arteries: Systolic pressure was moderately   increased. PA peak pressure: 58mm Hg (S) (PASP based on a   fairly faint spectral Doppler waveform)     07/2013 TEE Study Conclusions  - Left ventricle: Systolic function was moderately reduced.   The estimated ejection fraction was in the range of 35% to   40%. - Aortic valve: There was mild to moderate stenosis. Mild to   moderate regurgitation. - Mitral valve: The findings are consistent with moderate to   severe stenosis. Valve area by pressure half-time:   1.45cm^2. - Left atrium: There was spontaneous echo contrast   ("smoke"). - Atrial septum: No defect or patent foramen ovale was   identified. - Tricuspid valve: No evidence of vegetation. Transesophageal echocardiography.  2D and color Doppler. Height:  Height: 157.5cm. Height: 62in.  Weight:  Weight: 75kg. Weight: 165lb.  Body mass index:  BMI: 30.2kg/m^2. Body surface area:    BSA: 1.23m^2.  Blood pressure: 143/56.  Patient status:  Inpatient.  Location:  Endoscopy.      06/2018  echo Study Conclusions   - Left ventricle: The cavity size was normal. Wall thickness was   normal. Systolic function was normal. The estimated ejection   fraction was in the range of 60% to 65%. - Aortic valve: AV prosthesis is difficult to see Peak and mean   gradients through the valve are 18 and 10 mm Hg respectively. - Mitral valve: Peak and mean gradients through the valve   prosthesis are 19 and 4 mm Hg respectively MVA by P T1/2 is 3   cm2. s/p MV prosthesis. Valve prosthesis is difficult to see. - Pulmonary arteries: PA peak pressure: 48 mm Hg (S).     04/2020 echo IMPRESSIONS     1. Left ventricular ejection fraction, by estimation, is 60 to 65%. The  left ventricle has normal function. The left ventricle has no regional  wall motion abnormalities. Left ventricular diastolic parameters are  indeterminate.   2. Right ventricular systolic function is low normal. The right  ventricular size is normal. There is moderately elevated pulmonary artery  systolic pressure. The estimated right ventricular systolic pressure is  45.5 mmHg.   3. Left atrial size was mildly dilated.   4. The mitral valve has been repaired/replaced. No evidence of mitral  valve regurgitation. The mean mitral valve gradient is 5.0 mmHg. There is  a 25 mm Magna Ease bioprosthetic valve present in the mitral position.  Grossly normal function.   5. The aortic valve has been repaired/replaced. Aortic valve  regurgitation is not visualized. There is a 21 mm Magna Ease bioprosthetic  valve present in the aortic position. Aortic valve mean gradient measures  7.0 mmHg. Grossly normal function.   6. The inferior vena cava is normal in size with greater than 50%  respiratory variability, suggesting right atrial pressure of 3 mmHg.     07/2013 Cath Impression: 1. No angiographic evidence of CAD 2. Elevated filling pressures     01/2021 nuclear stress   Atrial fibrillation present throughout. No diagnostic  ST segment changes to indicate ischemia. Small, mild to moderate intensity, mid to apical anterior defect. This is fixed with the exception of a small degree of partial reversibility in the mid zone. Suggestive of scar versus variable soft tissue attenuation. This is a low risk study. Nuclear stress EF: 50%.   01/2021 carotid US Summary:  Right Carotid: Velocities in the right ICA are consistent with a 1-39%  stenosis.   Left Carotid: Velocities in the left ICA are consistent with a 1-39%  stenosis.   Vertebrals:  Bilateral vertebral arteries demonstrate antegrade flow.  Subclavians: Normal flow hemodynamics were seen in bilateral subclavian               arteries.            Assessment and Plan  1. Chronic diastolic HF/SOB - appears euvolemic by exam beyond just mild LE edema which unclear if cardiogenic. Her home weights are stable. Despite this significant progression in her SOB/DOE - repeat echo - EKG today questionable if back in a very subtle aflutter, her 04/2023 EKG showed a very distinctive Pwave that is no longer present. Will ask Dr Nelly Laurence to look at, if recurrent flutter could explain her DOE, has some palpitations with activity as well - metoprolol changed to bisoprolol after prior pulmonary visit given her lung disease history - check bmet/mg/bnp/cbc   2.Dysphagia - refer to GI    Antoine Poche, M.D.

## 2023-12-12 NOTE — Patient Instructions (Signed)
 Medication Instructions:  Your physician recommends that you continue on your current medications as directed. Please refer to the Current Medication list given to you today.  *If you need a refill on your cardiac medications before your next appointment, please call your pharmacy*   Lab Work: Complete Today  If you have labs (blood work) drawn today and your tests are completely normal, you will receive your results only by: MyChart Message (if you have MyChart) OR A paper copy in the mail If you have any lab test that is abnormal or we need to change your treatment, we will call you to review the results.   Testing/Procedures: Your physician has requested that you have an echocardiogram. Echocardiography is a painless test that uses sound waves to create images of your heart. It provides your doctor with information about the size and shape of your heart and how well your heart's chambers and valves are working. This procedure takes approximately one hour. There are no restrictions for this procedure. Please do NOT wear cologne, perfume, aftershave, or lotions (deodorant is allowed). Please arrive 15 minutes prior to your appointment time.  Please note: We ask at that you not bring children with you during ultrasound (echo/ vascular) testing. Due to room size and safety concerns, children are not allowed in the ultrasound rooms during exams. Our front office staff cannot provide observation of children in our lobby area while testing is being conducted. An adult accompanying a patient to their appointment will only be allowed in the ultrasound room at the discretion of the ultrasound technician under special circumstances. We apologize for any inconvenience.    Follow-Up: At [facility], you and your health needs are our priority.  As part of our continuing mission to provide you with exceptional heart care, we have created designated Provider Care Teams.  These Care Teams  include your primary Cardiologist (physician) and Advanced Practice Providers (APPs -  Physician Assistants and Nurse Practitioners) who all work together to provide you with the care you need, when you need it.  We recommend signing up for the patient portal called "MyChart".  Sign up information is provided on this After Visit Summary.  MyChart is used to connect with patients for Virtual Visits (Telemedicine).  Patients are able to view lab/test results, encounter notes, upcoming appointments, etc.  Non-urgent messages can be sent to your provider as well.   To learn more about what you can do with MyChart, go to [email or web address].    Your next appointment:    Keep April Appointment  Provider:   You may see Dina Rich, MD or one of the following Advanced Practice Providers on your designated Care Team:   Randall An, PA-C  Jacolyn Reedy, [place]     Other Instructions

## 2023-12-18 ENCOUNTER — Ambulatory Visit (HOSPITAL_COMMUNITY)
Admission: RE | Admit: 2023-12-18 | Discharge: 2023-12-18 | Disposition: A | Discharge status: Home / Self Care | Source: Ambulatory Visit | Attending: Cardiology | Admitting: Cardiology

## 2023-12-18 DIAGNOSIS — R0602 Shortness of breath: Secondary | ICD-10-CM | POA: Insufficient documentation

## 2023-12-19 LAB — ECHOCARDIOGRAM COMPLETE
AR max vel: 1.05 cm2
AV Area VTI: 1.15 cm2
AV Area mean vel: 0.98 cm2
AV Mean grad: 8 mmHg
AV Peak grad: 12 mmHg
Ao pk vel: 1.73 m/s
Area-P 1/2: 4.44 cm2
Est EF: 55
S' Lateral: 3 cm

## 2023-12-20 ENCOUNTER — Telehealth: Payer: Self-pay | Admitting: *Deleted

## 2023-12-20 ENCOUNTER — Ambulatory Visit: Admitting: Internal Medicine

## 2023-12-20 ENCOUNTER — Other Ambulatory Visit: Payer: Self-pay | Admitting: *Deleted

## 2023-12-20 VITALS — BP 110/71 | HR 97 | Temp 98.0°F | Ht 62.5 in | Wt 162.1 lb

## 2023-12-20 DIAGNOSIS — Z1211 Encounter for screening for malignant neoplasm of colon: Secondary | ICD-10-CM

## 2023-12-20 DIAGNOSIS — R1310 Dysphagia, unspecified: Secondary | ICD-10-CM | POA: Diagnosis not present

## 2023-12-20 DIAGNOSIS — R1110 Vomiting, unspecified: Secondary | ICD-10-CM | POA: Diagnosis not present

## 2023-12-20 DIAGNOSIS — Z7901 Long term (current) use of anticoagulants: Secondary | ICD-10-CM | POA: Diagnosis not present

## 2023-12-20 DIAGNOSIS — K219 Gastro-esophageal reflux disease without esophagitis: Secondary | ICD-10-CM

## 2023-12-20 DIAGNOSIS — R110 Nausea: Secondary | ICD-10-CM

## 2023-12-20 DIAGNOSIS — R1319 Other dysphagia: Secondary | ICD-10-CM

## 2023-12-20 NOTE — Patient Instructions (Signed)
 We will schedule you for upper endoscopy to further evaluate your difficulty swallowing.  I may elect to stretch your esophagus depending on findings.  At the same time we will perform colonoscopy for colon cancer screening purposes.  You will need to hold your Coumadin x 5 days prior to procedure.  We will reach out to your cardiologist Dr. Wyline Mood for his blessing in this regard.  Once we have heard back, we will call you to schedule procedures.  It was very nice seeing both you today.  Dr. Marletta Lor

## 2023-12-20 NOTE — Progress Notes (Signed)
 error

## 2023-12-20 NOTE — Telephone Encounter (Signed)
  Request for patient to stop medication prior to procedure or is needing cleareance  12/20/23  [name] 08-02-1946  What type of surgery is being performed? Colonoscopy/Esophagogastroduodenoscopy (EGD)  When is surgery scheduled? TBD  What type of clearance is required (medical or pharmacy to hold medication or both? Medication   Are there any medications that need to be held prior to surgery and how long? Coumadin x 5 days and does pt need to have Lovenox bridge.  Name of physician performing surgery?  Dr. Earnest Bailey [facility] Phone: [phone] Fax: [phone]  Anethesia type (none, local, MAC, general)? MAC

## 2023-12-20 NOTE — Progress Notes (Signed)
 Primary Care Physician:  Carylon Perches, MD Primary Gastroenterologist:  Dr. Marletta Lor  Chief Complaint  Patient presents with   Dysphagia    Has been getting progressively worse for past 6 months. She feels like food and pills get stuck. She also has trouble swallowing phlegm. Past 2 days has been belching a lot. She states "she feels sick as a buzzard:".    HPI:   [name] is a 78 y.o. female who presents to clinic today by referral from her cardiologist Dr. Wyline Mood for evaluation.  Chronic GERD: Chronic issue, currently taking pantoprazole 40 mg daily, having intermittent breakthrough symptoms.  Dysphagia, globus sensation, chronic cough: Progressively worsening over the past 6 months.  Feels like food and pills will get stuck in her esophagus.  Also notes excessive phlegm production, follows with pulmonology as well as ENT.  Laryngoscopy showed no masses or abnormalities on scope exam, VF are mobile. Postnasal drainage and findings c/w GERD LPR were also noted on exam. Allergies managed with Allegra and Flonase.   Underwent esophageal manometry in 2014 which was largely unremarkable.  Colon cancer screening: Last colonoscopy 01/06/2011 normal except hemorrhoids and diverticulosis.  No family history of colorectal malignancy.  No melena hematochezia.   Past Medical History:  Diagnosis Date   Anemia    Asthma    Atrial fibrillation (HCC)    Breast carcinoma (HCC) 1995   1995   Cardioembolic stroke (HCC) 01/2012   Right frontal in 01/2012; normal carotid ultrasound; possible LAA thrombus by TEE; virtual complete neurologic recovery   Chronic kidney disease, stage 2, mildly decreased GFR    GFR of approximately 60   Depression    Diabetes mellitus without complication (HCC)    Diverticulosis of colon (without mention of hemorrhage) 2012   Dr. Karilyn Cota   Fasting hyperglycemia    120 fasting   Gastroesophageal reflux disease    Gastroparesis    Hemorrhoids    Hyperlipidemia     Hypertension    pt denies 05/30/13     Dr Hinton Rao   Hyponatremia    Rheumatic heart disease    a. 07/2013 Echo: Ef 55-60%, Mod AS/AI, Mod-Sev MS, sev dil LA, PASP 58;  b. 07/2013 TEE: EF 35-40%, mild-mod AS/AI, mod-sev MS, LA smoke;  c. 07/2013 Cath: elev R heart pressures, nl cors.   Shortness of breath     Past Surgical History:  Procedure Laterality Date   AORTIC VALVE REPLACEMENT N/A 07/18/2013   Procedure: AORTIC VALVE REPLACEMENT (AVR);  Surgeon: [name], MD;  Location[facility] OR;  Service: Open Heart Surgery;  Laterality: N/A;   BACK SURGERY     BREAST LUMPECTOMY Right 1995   CARDIAC CATHETERIZATION     CARDIOVERSION N/A 07/26/2013   Procedure: CARDIOVERSION;  Surgeon: Pricilla Riffle, MD;  Location: [facility] ENDOSCOPY;  Service: Cardiovascular;  Laterality: N/A;   CARDIOVERSION N/A 01/04/2023   Procedure: CARDIOVERSION;  Surgeon: Meriam Sprague, MD;  Location: [facility] ENDOSCOPY;  Service: Cardiovascular;  Laterality: N/A;   COLONOSCOPY  2012   Negative screening procedure   DILATION AND CURETTAGE OF UTERUS     ESOPHAGEAL MANOMETRY N/A 06/17/2013   Procedure: ESOPHAGEAL MANOMETRY (EM);  Surgeon: Mardella Layman, MD;  Location: [facility] ENDOSCOPY;  Service: Endoscopy;  Laterality: N/A;   INTRAOPERATIVE TRANSESOPHAGEAL ECHOCARDIOGRAM N/A 07/18/2013   Procedure: INTRAOPERATIVE TRANSESOPHAGEAL ECHOCARDIOGRAM;  Surgeon: [name], MD;  Location: [facility] OR;  Service: Open Heart Surgery;  Laterality: N/A;   LEFT  AND RIGHT HEART CATHETERIZATION WITH CORONARY ANGIOGRAM N/A 07/10/2013   Procedure: LEFT AND RIGHT HEART CATHETERIZATION WITH CORONARY ANGIOGRAM;  Surgeon: Kathleene Hazel, MD;  Location: [facility] CATH LAB;  Service: Cardiovascular;  Laterality: N/A;   LUMBAR LAMINECTOMY/DECOMPRESSION MICRODISCECTOMY Right 05/13/2014   Procedure: LUMBAR LAMINECTOMY/DECOMPRESSION MICRODISCECTOMY 1 LEVEL  lumbar four/five;  Surgeon: Reinaldo Meeker, MD;  Location: [facility] NEURO ORS;  Service:  Neurosurgery;  Laterality: Right;   MITRAL VALVE REPLACEMENT N/A 07/18/2013   Procedure: MITRAL VALVE (MV) REPLACEMENT;  Surgeon: [name], MD;  Location: [facility] OR;  Service: Open Heart Surgery;  Laterality: N/A;   MITRAL VALVE SURGERY  [facility], closed mitral valvulotomy by finger fracture   TEE WITHOUT CARDIOVERSION  01/24/2012   Procedure: TRANSESOPHAGEAL ECHOCARDIOGRAM (TEE);  Surgeon: Lewayne Bunting, MD;  Location: [facility] ENDOSCOPY;  Service: Cardiovascular;  Laterality: N/A;   TEE WITHOUT CARDIOVERSION N/A 07/11/2013   Procedure: TRANSESOPHAGEAL ECHOCARDIOGRAM (TEE);  Surgeon: Vesta Mixer, MD;  Location: [facility] ENDOSCOPY;  Service: Cardiovascular;  Laterality: N/A;   TEE WITHOUT CARDIOVERSION N/A 07/26/2013   Procedure: TRANSESOPHAGEAL ECHOCARDIOGRAM (TEE);  Surgeon: Pricilla Riffle, MD;  Location: [facility] ENDOSCOPY;  Service: Cardiovascular;  Laterality: N/A;   TUBAL LIGATION  1973    Current Outpatient Medications  Medication Sig Dispense Refill   albuterol (PROVENTIL) (2.5 MG/3ML) 0.083% nebulizer solution USE 1 VIAL IN NEBULIZER EVERY4 HOURS AS NEEDED. 180 mL 5   albuterol (VENTOLIN HFA) 108 (90 Base) MCG/ACT inhaler Inhale 2 puffs into the lungs every 6 (six) hours as needed for wheezing or shortness of breath.     atorvastatin (LIPITOR) 40 MG tablet TAKE ONE TABLET BY MOUTH ONCE DAILY. 90 tablet 3   bisoprolol (ZEBETA) 10 MG tablet Take 1 tablet (10 mg total) by mouth daily.     budesonide-formoterol (SYMBICORT) 80-4.5 MCG/ACT inhaler Take 2 puffs first thing in am and then another 2 puffs about 12 hours later. 1 each 12   dextromethorphan-guaiFENesin (MUCINEX DM) 30-600 MG 12hr tablet Take 1 tablet by mouth 2 (two) times daily as needed for cough.     escitalopram (LEXAPRO) 10 MG tablet Take 10 mg by mouth daily with breakfast.     famotidine (PEPCID) 20 MG tablet TAKE 1 TABLET AFTER SUPPER 30 tablet 1   FARXIGA 10 MG TABS tablet Take 10 mg by mouth daily.     fexofenadine  (ALLEGRA) 180 MG tablet Take 180 mg by mouth in the morning.     fluticasone (FLONASE) 50 MCG/ACT nasal spray Place 1 spray into both nostrils daily.     gabapentin (NEURONTIN) 300 MG capsule Take 1 capsule (300 mg total) by mouth 3 (three) times daily. (Patient taking differently: Take 300 mg by mouth daily.) 270 capsule 3   levothyroxine (SYNTHROID) 100 MCG tablet Take 100 mcg by mouth daily before breakfast.     metFORMIN (GLUCOPHAGE) 500 MG tablet Take 1 tablet (500 mg total) by mouth 2 (two) times daily with a meal. 60 tablet 1   Multiple Vitamin (MULITIVITAMIN WITH MINERALS) TABS Take 1 tablet by mouth daily.     pantoprazole (PROTONIX) 40 MG tablet TAKE 1 TABLET 30 TO 60 MINUTES BEFORE FIRST MEAL OF THE DAY. 90 tablet 1   potassium chloride SA (KLOR-CON M) 20 MEQ tablet Take 1 tablet (20 mEq total) by mouth daily. 30 tablet 0   torsemide (DEMADEX) 20 MG tablet Take 1 tablet (20 mg total) by mouth daily. (May take an additional 20mg  as needed  for swelling) 45 tablet 6   warfarin (COUMADIN) 2.5 MG tablet TAKE AS DIRECTED BY COUMADIN CLINIC. 40 tablet 3   No current facility-administered medications for this visit.    Allergies as of 12/20/2023 - Review Complete 12/20/2023  Allergen Reaction Noted   Amiodarone Rash 09/05/2013    Family History  Problem Relation Age of Onset   Heart disease Brother 3       MI   Rheum arthritis Maternal Grandmother    Asthma Maternal Grandfather    Hypothyroidism Mother    Diabetes Sister    Cirrhosis Father     Social History   Socioeconomic History   Marital status: Married    Spouse name: Not on file   Number of children: 1   Years of education: Not on file   Highest education level: Not on file  Occupational History   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  Tobacco Use   Smoking status: Never   Smokeless tobacco: Never  Vaping Use   Vaping status: Never Used  Substance and Sexual Activity   Alcohol use: No     Alcohol/week: 0.0 standard drinks of alcohol   Drug use: No   Sexual activity: Not Currently  Other Topics Concern   Not on file  Social History Narrative   Married with children   No regular exercise   Social Drivers of Health   Financial Resource Strain: Not on file  Food Insecurity: No Food Insecurity (11/11/2022)   Hunger Vital Sign    Worried About Running Out of Food in the Last Year: Never true    Ran Out of Food in the Last Year: Never true  Transportation Needs: No Transportation Needs (11/11/2022)   PRAPARE - Administrator, Civil Service (Medical): No    Lack of Transportation (Non-Medical): No  Physical Activity: Not on file  Stress: Not on file  Social Connections: Not on file  Intimate Partner Violence: Not At Risk (11/11/2022)   Humiliation, Afraid, Rape, and Kick questionnaire    Fear of Current or Ex-Partner: No    Emotionally Abused: No    Physically Abused: No    Sexually Abused: No    Subjective: Review of Systems  Constitutional:  Negative for chills and fever.  HENT:  Negative for congestion and hearing loss.   Eyes:  Negative for blurred vision and double vision.  Respiratory:  Negative for cough and shortness of breath.   Cardiovascular:  Negative for chest pain and palpitations.  Gastrointestinal:  Positive for constipation and heartburn. Negative for abdominal pain, blood in stool, diarrhea, melena and vomiting.       Dysphagia  Genitourinary:  Negative for dysuria and urgency.  Musculoskeletal:  Negative for joint pain and myalgias.  Skin:  Negative for itching and rash.  Neurological:  Negative for dizziness and headaches.  Psychiatric/Behavioral:  Negative for depression. The patient is not nervous/anxious.        Objective: BP 110/71 (BP Location: Left Arm, Patient Position: Sitting, Cuff Size: Normal)   Pulse 97   Temp 98 F (36.7 C) (Oral)   Ht 5' 2.5" (1.588 m)   Wt 162 lb 1.6 oz (73.5 kg)   BMI 29.18 kg/m  Physical  Exam Constitutional:      Appearance: Normal appearance.  HENT:     Head: Normocephalic and atraumatic.  Eyes:     Extraocular Movements: Extraocular movements intact.     Conjunctiva/sclera: Conjunctivae normal.  Cardiovascular:  Rate and Rhythm: Normal rate and regular rhythm.  Pulmonary:     Effort: Pulmonary effort is normal.     Breath sounds: Normal breath sounds.  Abdominal:     General: Bowel sounds are normal.     Palpations: Abdomen is soft.  Musculoskeletal:        General: No swelling. Normal range of motion.     Cervical back: Normal range of motion and neck supple.  Skin:    General: Skin is warm and dry.     Coloration: Skin is not jaundiced.  Neurological:     General: No focal deficit present.     Mental Status: She is alert and oriented to person, place, and time.  Psychiatric:        Mood and Affect: Mood normal.        Behavior: Behavior normal.      Assessment/Plan:  1.  GERD, LPR- Will schedule for EGD to evaluate for peptic ulcer disease, esophagitis, gastritis, H. Pylori, duodenitis, or other. Will also evaluate for esophageal stricture, Schatzki's ring, esophageal web or other.   2.  Esophageal dysphagia, food regurgitation-EGD as above, may perform esophageal dilation depending on findings.  3.  Colon cancer screening-at same time as upper endoscopy will perform colonoscopy for colon cancer screening purposes.  4.  Systemic anticoagulation with Coumadin-will reach out to patient's cardiologist for clearance to hold Coumadin x 5 days prior to procedures.  Appreciate their help immensely.  The risks including infection, bleed, or perforation as well as benefits, limitations, alternatives and imponderables have been reviewed with the patient. Potential for esophageal dilation, biopsy, etc. have also been reviewed.  Questions have been answered. All parties agreeable.   12/20/2023 11:16 AM   Disclaimer: This note was dictated with voice  recognition software. Similar sounding words can inadvertently be transcribed and may not be corrected upon review.

## 2023-12-21 NOTE — Telephone Encounter (Signed)
     Primary Cardiologist: Dina Rich, MD  Clinical pharmacist have reviewed chart  as part of pre-operative protocol coverage.  The following recommendations have been provided for , [name] :  Patient with diagnosis of A Fib on warfarin for anticoagulation.     Procedure: Colonoscopy/Esophagogastroduodenoscopy (EGD)  Date of procedure: TBD     CHA2DS2-VASc Score = 9  This indicates a 12.2% annual risk of stroke. The patient's score is based upon: CHF History: 1 HTN History: 1 Diabetes History: 1 Stroke History: 2 Vascular Disease History: 1 Age Score: 2 Gender Score: 1       CrCl 34 ml/min Platelet count 187K   Per office protocol, patient can hold warfarin for 5 days prior to procedure.     Patient WILL need bridging with Lovenox (enoxaparin) around procedure. This will be organized by [facility] Coumadin clinic    I will route this recommendation to the requesting party via Epic fax function and remove from pre-op pool.  Please call with questions.  Thomasene Ripple. Ahmed Inniss NP-C     12/21/2023, 12:48 PM [facility] [address] Office [phone] Fax [phone]

## 2023-12-21 NOTE — Telephone Encounter (Signed)
 Patient with diagnosis of A Fib on warfarin for anticoagulation.    Procedure: Colonoscopy/Esophagogastroduodenoscopy (EGD)  Date of procedure: TBD   CHA2DS2-VASc Score = 9  This indicates a 12.2% annual risk of stroke. The patient's score is based upon: CHF History: 1 HTN History: 1 Diabetes History: 1 Stroke History: 2 Vascular Disease History: 1 Age Score: 2 Gender Score: 1     CrCl 34 ml/min Platelet count 187K  Per office protocol, patient can hold warfarin for 5 days prior to procedure.    Patient WILL need bridging with Lovenox (enoxaparin) around procedure. This will be organized by [facility] Coumadin clinic  **This guidance is not considered finalized until pre-operative APP has relayed final recommendations.**

## 2023-12-24 ENCOUNTER — Other Ambulatory Visit: Payer: Self-pay | Admitting: Internal Medicine

## 2023-12-24 DIAGNOSIS — R0609 Other forms of dyspnea: Secondary | ICD-10-CM

## 2023-12-27 ENCOUNTER — Telehealth: Payer: Self-pay | Admitting: Internal Medicine

## 2023-12-27 NOTE — Telephone Encounter (Signed)
 Okay to schedule for upper endoscopy with possible dilation and colonoscopy.  ASA 3.  Hold Coumadin x 5 days, patient will need Lovenox bridging per cardiology through the Coumadin [facility] clinic.  Thank you

## 2023-12-28 ENCOUNTER — Encounter: Payer: Self-pay | Admitting: Cardiology

## 2023-12-28 NOTE — Telephone Encounter (Signed)
 LMOVM to return call.

## 2023-12-28 NOTE — Telephone Encounter (Signed)
 Called to schedule pt and her husband says that pt isn't sure she is wanting to do the Lovenox bridge while holding the Coumadin. Pt is going to talk to Vashti Hey at the [facility] and will call back when she is ready to schedule.

## 2023-12-31 ENCOUNTER — Other Ambulatory Visit: Payer: Self-pay

## 2023-12-31 ENCOUNTER — Emergency Department (HOSPITAL_COMMUNITY)
Admission: EM | Admit: 2023-12-31 | Discharge: 2023-12-31 | Disposition: A | Discharge status: Home / Self Care | Attending: Emergency Medicine | Admitting: Emergency Medicine

## 2023-12-31 ENCOUNTER — Emergency Department (HOSPITAL_COMMUNITY)

## 2023-12-31 ENCOUNTER — Encounter (HOSPITAL_COMMUNITY): Payer: Self-pay | Admitting: Emergency Medicine

## 2023-12-31 DIAGNOSIS — J45909 Unspecified asthma, uncomplicated: Secondary | ICD-10-CM | POA: Insufficient documentation

## 2023-12-31 DIAGNOSIS — E1122 Type 2 diabetes mellitus with diabetic chronic kidney disease: Secondary | ICD-10-CM | POA: Insufficient documentation

## 2023-12-31 DIAGNOSIS — Z7951 Long term (current) use of inhaled steroids: Secondary | ICD-10-CM | POA: Diagnosis not present

## 2023-12-31 DIAGNOSIS — I509 Heart failure, unspecified: Secondary | ICD-10-CM | POA: Insufficient documentation

## 2023-12-31 DIAGNOSIS — J449 Chronic obstructive pulmonary disease, unspecified: Secondary | ICD-10-CM | POA: Insufficient documentation

## 2023-12-31 DIAGNOSIS — Z7901 Long term (current) use of anticoagulants: Secondary | ICD-10-CM | POA: Diagnosis not present

## 2023-12-31 DIAGNOSIS — J811 Chronic pulmonary edema: Secondary | ICD-10-CM | POA: Diagnosis not present

## 2023-12-31 DIAGNOSIS — I517 Cardiomegaly: Secondary | ICD-10-CM | POA: Diagnosis not present

## 2023-12-31 DIAGNOSIS — Z79899 Other long term (current) drug therapy: Secondary | ICD-10-CM | POA: Diagnosis not present

## 2023-12-31 DIAGNOSIS — I130 Hypertensive heart and chronic kidney disease with heart failure and stage 1 through stage 4 chronic kidney disease, or unspecified chronic kidney disease: Secondary | ICD-10-CM | POA: Diagnosis not present

## 2023-12-31 DIAGNOSIS — N189 Chronic kidney disease, unspecified: Secondary | ICD-10-CM | POA: Diagnosis not present

## 2023-12-31 DIAGNOSIS — I110 Hypertensive heart disease with heart failure: Secondary | ICD-10-CM | POA: Diagnosis not present

## 2023-12-31 DIAGNOSIS — Z8673 Personal history of transient ischemic attack (TIA), and cerebral infarction without residual deficits: Secondary | ICD-10-CM | POA: Diagnosis not present

## 2023-12-31 DIAGNOSIS — R0602 Shortness of breath: Secondary | ICD-10-CM | POA: Diagnosis not present

## 2023-12-31 DIAGNOSIS — Z7984 Long term (current) use of oral hypoglycemic drugs: Secondary | ICD-10-CM | POA: Insufficient documentation

## 2023-12-31 DIAGNOSIS — E876 Hypokalemia: Secondary | ICD-10-CM | POA: Insufficient documentation

## 2023-12-31 DIAGNOSIS — Z853 Personal history of malignant neoplasm of breast: Secondary | ICD-10-CM | POA: Insufficient documentation

## 2023-12-31 DIAGNOSIS — R0989 Other specified symptoms and signs involving the circulatory and respiratory systems: Secondary | ICD-10-CM | POA: Diagnosis not present

## 2023-12-31 LAB — LIPASE, BLOOD: Lipase: 59 U/L — ABNORMAL HIGH (ref 11–51)

## 2023-12-31 LAB — CBC WITH DIFFERENTIAL/PLATELET
Abs Immature Granulocytes: 0.01 10*3/uL (ref 0.00–0.07)
Basophils Absolute: 0.1 10*3/uL (ref 0.0–0.1)
Basophils Relative: 1 %
Eosinophils Absolute: 0.1 10*3/uL (ref 0.0–0.5)
Eosinophils Relative: 2 %
HCT: 39.6 % (ref 36.0–46.0)
Hemoglobin: 11.9 g/dL — ABNORMAL LOW (ref 12.0–15.0)
Immature Granulocytes: 0 %
Lymphocytes Relative: 20 %
Lymphs Abs: 1 10*3/uL (ref 0.7–4.0)
MCH: 25.8 pg — ABNORMAL LOW (ref 26.0–34.0)
MCHC: 30.1 g/dL (ref 30.0–36.0)
MCV: 85.9 fL (ref 80.0–100.0)
Monocytes Absolute: 0.4 10*3/uL (ref 0.1–1.0)
Monocytes Relative: 7 %
Neutro Abs: 3.6 10*3/uL (ref 1.7–7.7)
Neutrophils Relative %: 70 %
Platelets: 87 10*3/uL — ABNORMAL LOW (ref 150–400)
RBC: 4.61 MIL/uL (ref 3.87–5.11)
RDW: 15.9 % — ABNORMAL HIGH (ref 11.5–15.5)
WBC: 5.2 10*3/uL (ref 4.0–10.5)
nRBC: 0 % (ref 0.0–0.2)

## 2023-12-31 LAB — COMPREHENSIVE METABOLIC PANEL WITH GFR
ALT: 20 U/L (ref 0–44)
AST: 31 U/L (ref 15–41)
Albumin: 3.8 g/dL (ref 3.5–5.0)
Alkaline Phosphatase: 48 U/L (ref 38–126)
Anion gap: 13 (ref 5–15)
BUN: 15 mg/dL (ref 8–23)
CO2: 22 mmol/L (ref 22–32)
Calcium: 9.7 mg/dL (ref 8.9–10.3)
Chloride: 102 mmol/L (ref 98–111)
Creatinine, Ser: 1.33 mg/dL — ABNORMAL HIGH (ref 0.44–1.00)
GFR, Estimated: 41 mL/min — ABNORMAL LOW (ref >60)
Glucose, Bld: 125 mg/dL — ABNORMAL HIGH (ref 70–99)
Potassium: 3 mmol/L — ABNORMAL LOW (ref 3.5–5.1)
Sodium: 137 mmol/L (ref 135–145)
Total Bilirubin: 1.1 mg/dL (ref 0.0–1.2)
Total Protein: 6.9 g/dL (ref 6.5–8.1)

## 2023-12-31 LAB — MAGNESIUM: Magnesium: 1.9 mg/dL (ref 1.7–2.4)

## 2023-12-31 LAB — TROPONIN I (HIGH SENSITIVITY)
Troponin I (High Sensitivity): 17 ng/L (ref <18)
Troponin I (High Sensitivity): 20 ng/L — ABNORMAL HIGH (ref <18)

## 2023-12-31 LAB — RESP PANEL BY RT-PCR (RSV, FLU A&B, COVID)  RVPGX2
Influenza A by PCR: NEGATIVE
Influenza B by PCR: NEGATIVE
Resp Syncytial Virus by PCR: NEGATIVE
SARS Coronavirus 2 by RT PCR: NEGATIVE

## 2023-12-31 LAB — PROTIME-INR
INR: 3 — ABNORMAL HIGH (ref 0.8–1.2)
Prothrombin Time: 31 s — ABNORMAL HIGH (ref 11.4–15.2)

## 2023-12-31 LAB — BRAIN NATRIURETIC PEPTIDE: B Natriuretic Peptide: 388 pg/mL — ABNORMAL HIGH (ref 0.0–100.0)

## 2023-12-31 MED ORDER — DAPAGLIFLOZIN PROPANEDIOL 10 MG PO TABS
10.0000 mg | ORAL_TABLET | Freq: Once | ORAL | Status: DC
  Filled 2023-12-31: qty 1

## 2023-12-31 MED ORDER — IPRATROPIUM-ALBUTEROL 0.5-2.5 (3) MG/3ML IN SOLN
3.0000 mL | Freq: Once | RESPIRATORY_TRACT | Status: AC
  Administered 2023-12-31: 3 mL via RESPIRATORY_TRACT
  Filled 2023-12-31: qty 3

## 2023-12-31 MED ORDER — ASPIRIN 81 MG PO CHEW
324.0000 mg | CHEWABLE_TABLET | Freq: Once | ORAL | Status: DC
  Filled 2023-12-31: qty 4

## 2023-12-31 MED ORDER — FUROSEMIDE 10 MG/ML IJ SOLN
40.0000 mg | Freq: Once | INTRAMUSCULAR | Status: AC
  Administered 2023-12-31: 40 mg via INTRAVENOUS
  Filled 2023-12-31: qty 4

## 2023-12-31 MED ORDER — POTASSIUM CHLORIDE CRYS ER 20 MEQ PO TBCR
40.0000 meq | EXTENDED_RELEASE_TABLET | Freq: Once | ORAL | Status: AC
  Administered 2023-12-31: 40 meq via ORAL
  Filled 2023-12-31: qty 2

## 2023-12-31 MED ORDER — BISOPROLOL FUMARATE 5 MG PO TABS
10.0000 mg | ORAL_TABLET | Freq: Once | ORAL | Status: DC
  Filled 2023-12-31: qty 2

## 2023-12-31 MED ORDER — GABAPENTIN 300 MG PO CAPS
300.0000 mg | ORAL_CAPSULE | Freq: Once | ORAL | Status: DC
  Filled 2023-12-31: qty 1

## 2023-12-31 NOTE — ED Notes (Signed)
 Pt up with x1 assist to bedside toilet.

## 2023-12-31 NOTE — ED Triage Notes (Signed)
 Pt states she woke up around 0300 today feeling SOB. No relief w/ albuterol inhaler. Hx of COPD. Sats 97% on RA.

## 2023-12-31 NOTE — ED Provider Notes (Signed)
 [place] EMERGENCY DEPARTMENT AT [facility] Provider Note   CSN: [id number] Arrival date & time: 12/31/23  8413     History  Chief Complaint  Patient presents with   Shortness of Breath    [name] is a 78 y.o. female.  HPI Patient presents for shortness of breath.  Medical history includes CVA, breast cancer, asthma, atrial fibrillation, CKD, depression, DM, GERD, gastroparesis, HLD, HTN, COPD, CHF.  She was in her normal state of health last night.  This morning, she woke up feeling short of breath.  She denies any associated pain. She tried her albuterol inhaler at home with no relief.  She feels that her bilateral lower extremity swelling is slightly worse than baseline. She had some mild nausea without vomiting this morning, which has since resolved.    Home Medications Prior to Admission medications   Medication Sig Start Date End Date Taking? Authorizing Provider  albuterol (PROVENTIL) (2.5 MG/3ML) 0.083% nebulizer solution USE 1 VIAL IN NEBULIZER EVERY4 HOURS AS NEEDED. 01/16/23   Nyoka Cowden, MD  albuterol (VENTOLIN HFA) 108 (90 Base) MCG/ACT inhaler Inhale 2 puffs into the lungs every 6 (six) hours as needed for wheezing or shortness of breath. 08/17/23   [provider]  atorvastatin (LIPITOR) 40 MG tablet TAKE ONE TABLET BY MOUTH ONCE DAILY. 07/24/23   Antoine Poche, MD  bisoprolol (ZEBETA) 10 MG tablet Take 1 tablet (10 mg total) by mouth daily. 08/11/23   Antoine Poche, MD  budesonide-formoterol (SYMBICORT) 80-4.5 MCG/ACT inhaler Take 2 puffs first thing in am and then another 2 puffs about 12 hours later. 08/10/23   Nyoka Cowden, MD  dextromethorphan-guaiFENesin (MUCINEX DM) 30-600 MG 12hr tablet Take 1 tablet by mouth 2 (two) times daily as needed for cough.    [provider]  escitalopram (LEXAPRO) 10 MG tablet Take 10 mg by mouth daily with breakfast. 04/10/15   [provider]  famotidine (PEPCID) 20 MG  tablet TAKE ONE TABLET BY MOUTH EVERY DAY AFTER SUPPER 12/25/23   Nyoka Cowden, MD  FARXIGA 10 MG TABS tablet Take 10 mg by mouth daily. 03/28/23   [provider]  fexofenadine (ALLEGRA) 180 MG tablet Take 180 mg by mouth in the morning.    [provider]  fluticasone (FLONASE) 50 MCG/ACT nasal spray Place 1 spray into both nostrils daily. 09/05/13   Antoine Poche, MD  gabapentin (NEURONTIN) 300 MG capsule Take 1 capsule (300 mg total) by mouth 3 (three) times daily. Patient taking differently: Take 300 mg by mouth daily. 09/21/23   Nyoka Cowden, MD  levothyroxine (SYNTHROID) 100 MCG tablet Take 100 mcg by mouth daily before breakfast.    [provider]  metFORMIN (GLUCOPHAGE) 500 MG tablet Take 1 tablet (500 mg total) by mouth 2 (two) times daily with a meal. 08/03/13   Collins, Debby Freiberg, PA-C  Multiple Vitamin (MULITIVITAMIN WITH MINERALS) TABS Take 1 tablet by mouth daily.    [provider]  pantoprazole (PROTONIX) 40 MG tablet TAKE 1 TABLET 30 TO 60 MINUTES BEFORE FIRST MEAL OF THE DAY. 10/30/23   Nyoka Cowden, MD  potassium chloride SA (KLOR-CON M) 20 MEQ tablet Take 1 tablet (20 mEq total) by mouth daily. 11/14/22 12/29/23  Rai, Delene Ruffini, MD  torsemide (DEMADEX) 20 MG tablet Take 1 tablet (20 mg total) by mouth daily. (May take an additional 20mg  as needed for swelling) 12/04/23   Branch, Dorothe Pea, MD  warfarin (COUMADIN) 2.5 MG tablet TAKE AS DIRECTED BY COUMADIN CLINIC. 11/20/23   Antoine Poche, MD      Allergies    Amiodarone    Review of Systems   Review of Systems  Respiratory:  Positive for shortness of breath.   Gastrointestinal:  Positive for nausea.  All other systems reviewed and are negative.   Physical Exam Updated Vital Signs BP 126/74   Pulse 98   Temp 98.3 F (36.8 C) (Oral)   Resp 20   Ht 5' 2.5" (1.588 m)   Wt 75 kg   SpO2 96%   BMI 29.76 kg/m  Physical Exam Vitals and nursing note reviewed.   Constitutional:      General: She is not in acute distress.    Appearance: Normal appearance. She is well-developed. She is not ill-appearing, toxic-appearing or diaphoretic.  HENT:     Head: Normocephalic and atraumatic.     Right Ear: External ear normal.     Left Ear: External ear normal.     Nose: Nose normal.     Mouth/Throat:     Mouth: Mucous membranes are moist.  Eyes:     Extraocular Movements: Extraocular movements intact.     Conjunctiva/sclera: Conjunctivae normal.  Cardiovascular:     Rate and Rhythm: Normal rate and regular rhythm.     Heart sounds: No murmur heard. Pulmonary:     Effort: Pulmonary effort is normal. Tachypnea present. No respiratory distress.     Breath sounds: Decreased breath sounds present. No wheezing, rhonchi or rales.  Abdominal:     General: There is no distension.     Palpations: Abdomen is soft.  Musculoskeletal:        General: No swelling or deformity. Normal range of motion.     Cervical back: Normal range of motion and neck supple.     Right lower leg: Edema present.     Left lower leg: Edema present.  Skin:    General: Skin is warm and dry.     Coloration: Skin is not jaundiced or pale.  Neurological:     General: No focal deficit present.     Mental Status: She is alert and oriented to person, place, and time.  Psychiatric:        Mood and Affect: Mood normal.        Behavior: Behavior normal.     ED Results / Procedures / Treatments   Labs (all labs ordered are listed, but only abnormal results are displayed) Labs Reviewed  COMPREHENSIVE METABOLIC PANEL WITH GFR - Abnormal; Notable for the following components:      Result Value   Potassium 3.0 (*)    Glucose, Bld 125 (*)    Creatinine, Ser 1.33 (*)    GFR, Estimated 41 (*)    All other components within normal limits  BRAIN NATRIURETIC PEPTIDE - Abnormal; Notable for the following components:   B Natriuretic Peptide 388.0 (*)    All other components within normal  limits  CBC WITH DIFFERENTIAL/PLATELET - Abnormal; Notable for the following components:   Hemoglobin 11.9 (*)    MCH 25.8 (*)    RDW 15.9 (*)    Platelets 87 (*)    All other components within normal limits  LIPASE, BLOOD - Abnormal; Notable for the following components:   Lipase 59 (*)    All other components within normal limits  PROTIME-INR - Abnormal; Notable for the following components:   Prothrombin Time 31.0 (*)  INR 3.0 (*)    All other components within normal limits  TROPONIN I (HIGH SENSITIVITY) - Abnormal; Notable for the following components:   Troponin I (High Sensitivity) 20 (*)    All other components within normal limits  RESP PANEL BY RT-PCR (RSV, FLU A&B, COVID)  RVPGX2  MAGNESIUM  TROPONIN I (HIGH SENSITIVITY)    EKG EKG Interpretation Date/Time:  Sunday December 31 2023 09:02:34 EDT Ventricular Rate:  97 PR Interval:  298 QRS Duration:  105 QT Interval:  378 QTC Calculation: 481 R Axis:   125  Text Interpretation: Sinus rhythm Prolonged PR interval Probable right ventricular hypertrophy Nonspecific T abnrm, anterolateral leads Confirmed by Gloris Manchester [id number]) on 12/31/2023 9:38:42 AM  Radiology DG Chest Port 1 View Result Date: 12/31/2023 CLINICAL DATA:  Shortness of breath EXAM: PORTABLE CHEST 1 VIEW COMPARISON:  11/11/2022 FINDINGS: Stable cardiomegaly. Prior sternotomy. Prosthetic cardiac valves. Pulmonary vascular congestion and increased perihilar and bibasilar interstitial markings. No large pleural fluid collection. No pneumothorax. IMPRESSION: Cardiomegaly with pulmonary vascular congestion and mild interstitial edema. Electronically Signed   By: Duanne Guess D.O.   On: 12/31/2023 09:33    Procedures Procedures    Medications Ordered in ED Medications  bisoprolol (ZEBETA) tablet 10 mg (has no administration in time range)  gabapentin (NEURONTIN) capsule 300 mg (has no administration in time range)  dapagliflozin propanediol (FARXIGA)  tablet 10 mg (has no administration in time range)  ipratropium-albuterol (DUONEB) 0.5-2.5 (3) MG/3ML nebulizer solution 3 mL (3 mLs Nebulization Given 12/31/23 0931)  potassium chloride SA (KLOR-CON M) CR tablet 40 mEq (40 mEq Oral Given 12/31/23 1119)  furosemide (LASIX) injection 40 mg (40 mg Intravenous Given 12/31/23 1148)    ED Course/ Medical Decision Making/ A&P                                 Medical Decision Making Amount and/or Complexity of Data Reviewed Labs: ordered. Radiology: ordered.  Risk Prescription drug management.   This patient presents to the ED for concern of shortness of breath, this involves an extensive number of treatment options, and is a complaint that carries with it a high risk of complications and morbidity.  The differential diagnosis includes reactive airway disease, CHF, pneumonia, ACS, anxiety   Co morbidities that complicate the patient evaluation  CVA, breast cancer, asthma, atrial fibrillation, CKD, depression, DM, GERD, gastroparesis, HLD, HTN, COPD, CHF   Additional history obtained:  Additional history obtained from patient's significant other External records from outside source obtained and reviewed including EMR   Lab Tests:  I Ordered, and personally interpreted labs.  The pertinent results include: Normal hemoglobin, no leukocytosis, baseline creatinine, hypokalemia with otherwise normal electrolytes, BNP is moderately elevated   Imaging Studies ordered:  I ordered imaging studies including chest x-ray I independently visualized and interpreted imaging which showed cardiomegaly with pulmonary vascular congestion and some mild interstitial edema I agree with the radiologist interpretation   Cardiac Monitoring: / EKG:  The patient was maintained on a cardiac monitor.  I personally viewed and interpreted the cardiac monitored which showed an underlying rhythm of: Sinus rhythm  Problem List / ED Course / Critical interventions  / Medication management  Patient presenting for shortness of breath.  Onset was earlier this morning.  She had some transient upper abdominal pain and nausea without vomiting.  She states that these have resolved.  She does have some mild epigastric tenderness.  Auscultation, she has some diminished breath sounds.  No wheezing, crackles, or rhonchi are appreciated.  DuoNeb was ordered.  Workup was initiated.  Patient's lab work is notable for moderately elevated BNP.  X-ray imaging does show some mild interstitial edema.  She is on warfarin and INR today is 3.0.  I do not suspect PE.  I suspect that her shortness of breath episode was primarily due to hypervolemia.  She was given dose of IV Lasix while in the ED.  She did have good urine output and does endorse improved shortness of breath.  She was ambulated without any recurrence of shortness of breath.  She was advised to take extra dose of torsemide for the next couple days and to follow-up with PCP.  She was discharged in stable condition. I ordered medication including DuoNeb for COPD; potassium chloride for hypokalemia; Lasix for diuresis Reevaluation of the patient after these medicines showed that the patient improved I have reviewed the patients home medicines and have made adjustments as needed   Social Determinants of Health:  Has access to outpatient care        Final Clinical Impression(s) / ED Diagnoses Final diagnoses:  Acute on chronic congestive heart failure, unspecified heart failure type (HCC)  Hypokalemia    Rx / DC Orders ED Discharge Orders     None         Gloris Manchester, MD 12/31/23 1328

## 2023-12-31 NOTE — Discharge Instructions (Addendum)
 Your workup today is overall reassuring.  Your potassium was low.  You were given potassium supplement while in the ED.  Take extra dose of your home potassium supplement for the next 2 days.  I suspect that your shortness of breath is primarily due to fluid overload.  You did receive a diuretic while in the emergency department.  For the next 2 days, take an extra dose of torsemide for continued diuresis.  Follow-up with your primary care doctor.  Return to the emergency department for any new or worsening symptoms of concern.

## 2024-01-01 NOTE — Telephone Encounter (Signed)
 Spouse called in. Patient decided she did not want to do lovenox and will not be scheduling procedure at this time. If they change mind, he reports they will call back

## 2024-01-05 ENCOUNTER — Other Ambulatory Visit: Payer: Self-pay | Admitting: Internal Medicine

## 2024-01-05 NOTE — Telephone Encounter (Signed)
**Note De-identified  [place] Obfuscation** Please advise 

## 2024-01-10 ENCOUNTER — Encounter: Payer: Self-pay | Admitting: Cardiology

## 2024-01-11 DIAGNOSIS — I700 Atherosclerosis of aorta: Secondary | ICD-10-CM | POA: Diagnosis not present

## 2024-01-11 DIAGNOSIS — E1129 Type 2 diabetes mellitus with other diabetic kidney complication: Secondary | ICD-10-CM | POA: Diagnosis not present

## 2024-01-11 DIAGNOSIS — R413 Other amnesia: Secondary | ICD-10-CM | POA: Diagnosis not present

## 2024-01-11 DIAGNOSIS — E785 Hyperlipidemia, unspecified: Secondary | ICD-10-CM | POA: Diagnosis not present

## 2024-01-11 DIAGNOSIS — E039 Hypothyroidism, unspecified: Secondary | ICD-10-CM | POA: Diagnosis not present

## 2024-01-11 DIAGNOSIS — I6300 Cerebral infarction due to thrombosis of unspecified precerebral artery: Secondary | ICD-10-CM | POA: Diagnosis not present

## 2024-01-11 DIAGNOSIS — I5032 Chronic diastolic (congestive) heart failure: Secondary | ICD-10-CM | POA: Diagnosis not present

## 2024-01-11 DIAGNOSIS — N1831 Chronic kidney disease, stage 3a: Secondary | ICD-10-CM | POA: Diagnosis not present

## 2024-01-11 DIAGNOSIS — F329 Major depressive disorder, single episode, unspecified: Secondary | ICD-10-CM | POA: Diagnosis not present

## 2024-01-12 ENCOUNTER — Encounter: Payer: Self-pay | Admitting: *Deleted

## 2024-01-15 ENCOUNTER — Encounter

## 2024-01-15 ENCOUNTER — Encounter: Payer: Self-pay | Admitting: Cardiology

## 2024-01-15 ENCOUNTER — Ambulatory Visit: Payer: Medicare PPO | Attending: Cardiology | Admitting: Cardiology

## 2024-01-15 VITALS — BP 118/68 | HR 96 | Ht 62.0 in | Wt 163.0 lb

## 2024-01-15 DIAGNOSIS — I484 Atypical atrial flutter: Secondary | ICD-10-CM | POA: Diagnosis not present

## 2024-01-15 DIAGNOSIS — R0602 Shortness of breath: Secondary | ICD-10-CM

## 2024-01-15 DIAGNOSIS — I5032 Chronic diastolic (congestive) heart failure: Secondary | ICD-10-CM | POA: Diagnosis not present

## 2024-01-15 MED ORDER — BISOPROLOL FUMARATE 10 MG PO TABS: 10.0000 mg | ORAL_TABLET | Freq: Every day | ORAL | Status: DC

## 2024-01-15 MED ORDER — BISOPROLOL FUMARATE 5 MG PO TABS: 5.0000 mg | ORAL_TABLET | Freq: Every day | ORAL | Status: DC

## 2024-01-15 MED ORDER — BISOPROLOL FUMARATE 10 MG PO TABS: 15.0000 mg | ORAL_TABLET | Freq: Every day | ORAL | 6 refills | Status: DC

## 2024-01-15 MED ORDER — BISOPROLOL FUMARATE 5 MG PO TABS: 5.0000 mg | ORAL_TABLET | Freq: Every day | ORAL | 1 refills | Status: DC

## 2024-01-15 NOTE — Addendum Note (Signed)
 Addended by: Floretta Petro G on: 01/15/2024 12:03 PM   Modules accepted: Orders

## 2024-01-15 NOTE — Patient Instructions (Signed)
 Medication Instructions:   Increase Bisoprolol to 15mg  daily Hold Torsemide x 2 days, then resume previous dosing  Continue all other medications.     Labwork:  none  Testing/Procedures:  none  Follow-Up:  3 months   Any Other Special Instructions Will Be Listed Below (If Applicable).  Sooner visit with Dr. Arlester Ladd   If you need a refill on your cardiac medications before your next appointment, please call your pharmacy.

## 2024-01-15 NOTE — Progress Notes (Signed)
 Clinical Summary Ms. Klingbeil is a 78 y.o.female seen today for follow up of the following medical problems.   1. Valvular heart disease   - 07/18/13 patient underwent MVR with 25 mm Edwards Magna-ease pericardial valve and also AVR with 21 mm Edwards Magna-ease pericardial valve    -03/2022 echo: LVEF 60%, indet diastolic, normal MVR and AVR. 12/2023 echo: LVEF 55%, grade II dd, mod RV dysfunction, normal MVR and AVR     2.Chronic HFpEF  11/2022 echo: LVEF 55-60%, indet diastolic fxn, mod pulm HTN, normal MVR and AVR 12/2023 echo: LVEF 55%, grade II dd, mod RV dysfunction, normal MVR and AVR  - sigifnicant SOB/DOE since Christmas - significant increase DOE. Up until walking 1/3 mile comfortable, now DOE just walking to mail box - some chest tightness at times. Midchest, 5/10 in severity. Not positional. Can last few hours constant. - mild increase in LE edema, taking extra torsemide.  - increased cough, no wheezing. Nonproductive cough - home weights 167 lbs.  - palpitations at times with activity, not at reast.    12/31/23 ER visit with SOB - BNP 300s, CXR with cardiomegaly and pulm vascular congestion, mild edema - given IV lasix with improved symptoms in ER, discharged - no recent edema - compliant with meds - home weights 159 lbs stable. Down from 167 last visit - takes torsemide 20mg  daily. Has had uptrend in Cr with most recent pcp labs, Cr up to 1.4    3. Afib/aflutter - hx of cardioembolic CVA, has been on longterm anticoag - DCCV 01/04/23 - from Dr Morrie Sheldon notes if recurent aflutter consider amiodarone. -  -discussed EKG with EP last visit, thought to be atypical flutter or atach.  - EKG today looks more evidence suspecting atypical flutter - she reports ongoing palpitations, SOB with activities.          Other medical problems not addressed this visit        Other medical issues not addressed this visit       3. HTN - she is compliantw ith meds     4. Hyperlipidemia - recent labs with pcp, compliant with stin   06/2021 TC 185 TG 75 HDL 66 LDL 104 01/2022 TC 177 TG 133 HDL 58 LDL 96   5. Carotid stenosis - 07/2013 [place] with mild bilateral stenosis 2018 mild disease - 01/2021 [place] mild bilateral disease - 11/2022 mild bilateral disease        6. Obstructive lung disease 2014 PFTs moderate obstruction     7. Syncope -admit 11/2022, from notes intense shoulder pain which triggered syncope suggesting vasovagal etiology. Had low bp and HRs Past Medical History:  Diagnosis Date   Anemia    Asthma    Atrial fibrillation (HCC)    Breast carcinoma (HCC) 1995   1995   Cardioembolic stroke (HCC) 01/2012   Right frontal in 01/2012; normal carotid ultrasound; possible LAA thrombus by TEE; virtual complete neurologic recovery   Chronic kidney disease, stage 2, mildly decreased GFR    GFR of approximately 60   Depression    Diabetes mellitus without complication (HCC)    Diverticulosis of colon (without mention of hemorrhage) 2012   Dr. Karilyn Cota   Fasting hyperglycemia    120 fasting   Gastroesophageal reflux disease    Gastroparesis    Hemorrhoids    Hyperlipidemia    Hypertension    pt denies 05/30/13     Dr Hinton Rao  Hyponatremia    Rheumatic heart disease    a. 07/2013 Echo: Ef 55-60%, Mod AS/AI, Mod-Sev MS, sev dil LA, PASP 58;  b. 07/2013 TEE: EF 35-40%, mild-mod AS/AI, mod-sev MS, LA smoke;  c. 07/2013 Cath: elev R heart pressures, nl cors.   Shortness of breath      Allergies  Allergen Reactions   Amiodarone Rash     Current Outpatient Medications  Medication Sig Dispense Refill   albuterol (PROVENTIL) (2.5 MG/3ML) 0.083% nebulizer solution USE 1 VIAL IN NEBULIZER EVERY4 HOURS AS NEEDED. 180 mL 5   albuterol (VENTOLIN HFA) 108 (90 Base) MCG/ACT inhaler Inhale 2 puffs into the lungs every 6 (six) hours as needed for wheezing or shortness of breath.     atorvastatin (LIPITOR) 40 MG tablet TAKE ONE TABLET BY  MOUTH ONCE DAILY. 90 tablet 3   bisoprolol (ZEBETA) 10 MG tablet Take 1 tablet (10 mg total) by mouth daily.     budesonide-formoterol (SYMBICORT) 80-4.5 MCG/ACT inhaler Take 2 puffs first thing in am and then another 2 puffs about 12 hours later. 1 each 12   dextromethorphan-guaiFENesin (MUCINEX DM) 30-600 MG 12hr tablet Take 1 tablet by mouth 2 (two) times daily as needed for cough.     escitalopram (LEXAPRO) 10 MG tablet Take 10 mg by mouth daily with breakfast.     famotidine (PEPCID) 20 MG tablet TAKE ONE TABLET BY MOUTH EVERY DAY AFTER SUPPER 30 tablet 0   FARXIGA 10 MG TABS tablet Take 10 mg by mouth daily.     fexofenadine (ALLEGRA) 180 MG tablet Take 180 mg by mouth in the morning.     fluticasone (FLONASE) 50 MCG/ACT nasal spray Place 1 spray into both nostrils daily.     gabapentin (NEURONTIN) 300 MG capsule Take 1 capsule (300 mg total) by mouth 2 (two) times daily. 180 capsule 3   levothyroxine (SYNTHROID) 100 MCG tablet Take 100 mcg by mouth daily before breakfast.     metFORMIN (GLUCOPHAGE) 500 MG tablet Take 1 tablet (500 mg total) by mouth 2 (two) times daily with a meal. 60 tablet 1   Multiple Vitamin (MULITIVITAMIN WITH MINERALS) TABS Take 1 tablet by mouth daily.     pantoprazole (PROTONIX) 40 MG tablet TAKE 1 TABLET 30 TO 60 MINUTES BEFORE FIRST MEAL OF THE DAY. 90 tablet 1   potassium chloride SA (KLOR-CON M) 20 MEQ tablet Take 1 tablet (20 mEq total) by mouth daily. 30 tablet 0   torsemide (DEMADEX) 20 MG tablet Take 1 tablet (20 mg total) by mouth daily. (May take an additional 20mg  as needed for swelling) 45 tablet 6   warfarin (COUMADIN) 2.5 MG tablet TAKE AS DIRECTED BY COUMADIN CLINIC. 40 tablet 3   No current facility-administered medications for this visit.     Past Surgical History:  Procedure Laterality Date   AORTIC VALVE REPLACEMENT N/A 07/18/2013   Procedure: AORTIC VALVE REPLACEMENT (AVR);  Surgeon: [name], MD;  Location: [facility] OR;  Service: Open  Heart Surgery;  Laterality: N/A;   BACK SURGERY     BREAST LUMPECTOMY Right 1995   CARDIAC CATHETERIZATION     CARDIOVERSION N/A 07/26/2013   Procedure: CARDIOVERSION;  Surgeon: Pricilla Riffle, MD;  Location: [facility] ENDOSCOPY;  Service: Cardiovascular;  Laterality: N/A;   CARDIOVERSION N/A 01/04/2023   Procedure: CARDIOVERSION;  Surgeon: Meriam Sprague, MD;  Location: [facility] ENDOSCOPY;  Service: Cardiovascular;  Laterality: N/A;   COLONOSCOPY  2012   Negative screening procedure  DILATION AND CURETTAGE OF UTERUS     ESOPHAGEAL MANOMETRY N/A 06/17/2013   Procedure: ESOPHAGEAL MANOMETRY (EM);  Surgeon: Mardella Layman, MD;  Location: [facility] ENDOSCOPY;  Service: Endoscopy;  Laterality: N/A;   INTRAOPERATIVE TRANSESOPHAGEAL ECHOCARDIOGRAM N/A 07/18/2013   Procedure: INTRAOPERATIVE TRANSESOPHAGEAL ECHOCARDIOGRAM;  Surgeon: [name], MD;  Location: [facility] OR;  Service: Open Heart Surgery;  Laterality: N/A;   LEFT AND RIGHT HEART CATHETERIZATION WITH CORONARY ANGIOGRAM N/A 07/10/2013   Procedure: LEFT AND RIGHT HEART CATHETERIZATION WITH CORONARY ANGIOGRAM;  Surgeon: Kathleene Hazel, MD;  Location: [facility] CATH LAB;  Service: Cardiovascular;  Laterality: N/A;   LUMBAR LAMINECTOMY/DECOMPRESSION MICRODISCECTOMY Right 05/13/2014   Procedure: LUMBAR LAMINECTOMY/DECOMPRESSION MICRODISCECTOMY 1 LEVEL  lumbar four/five;  Surgeon: Reinaldo Meeker, MD;  Location: [facility] NEURO ORS;  Service: Neurosurgery;  Laterality: Right;   MITRAL VALVE REPLACEMENT N/A 07/18/2013   Procedure: MITRAL VALVE (MV) REPLACEMENT;  Surgeon: [name], MD;  Location: [facility] OR;  Service: Open Heart Surgery;  Laterality: N/A;   MITRAL VALVE SURGERY  [facility], closed mitral valvulotomy by finger fracture   TEE WITHOUT CARDIOVERSION  01/24/2012   Procedure: TRANSESOPHAGEAL ECHOCARDIOGRAM (TEE);  Surgeon: Lewayne Bunting, MD;  Location: [facility] ENDOSCOPY;  Service: Cardiovascular;  Laterality: N/A;   TEE WITHOUT CARDIOVERSION N/A 07/11/2013    Procedure: TRANSESOPHAGEAL ECHOCARDIOGRAM (TEE);  Surgeon: Vesta Mixer, MD;  Location: [facility] ENDOSCOPY;  Service: Cardiovascular;  Laterality: N/A;   TEE WITHOUT CARDIOVERSION N/A 07/26/2013   Procedure: TRANSESOPHAGEAL ECHOCARDIOGRAM (TEE);  Surgeon: Pricilla Riffle, MD;  Location: [facility] ENDOSCOPY;  Service: Cardiovascular;  Laterality: N/A;   TUBAL LIGATION  1973     Allergies  Allergen Reactions   Amiodarone Rash      Family History  Problem Relation Age of Onset   Heart disease Brother 45       MI   Rheum arthritis Maternal Grandmother    Asthma Maternal Grandfather    Hypothyroidism Mother    Diabetes Sister    Cirrhosis Father      Social History Ms. Silvey reports that she has never smoked. She has never used smokeless tobacco. Ms. Thom reports no history of alcohol use.    Physical Examination Today's Vitals   01/15/24 0903  BP: 118/68  Pulse: 96  SpO2: 93%  Weight: 163 lb (73.9 kg)  Height: 5\' 2"  (1.575 m)   Body mass index is 29.81 kg/m.  Gen: resting comfortably, no acute distress HEENT: no scleral icterus, pupils equal round and reactive, no palptable cervical adenopathy,  CV: regular rate and rhythm Resp: Clear to auscultation bilaterally GI: abdomen is soft, non-tender, non-distended, normal bowel sounds, no hepatosplenomegaly MSK: extremities are warm, no edema.  Skin: warm, no rash Neuro:  no focal deficits Psych: appropriate affect   Diagnostic Studies 07/2013 TTE Study Conclusions  - Study data: Technically difficult study. - Left ventricle: The cavity size was normal. Wall thickness   was normal. Systolic function was normal. The estimated   ejection fraction was in the range of 55% to 60%.   Indeterminate diastolic function. There is evidence of   very high left atrial pressures (E/e' 63) - Aortic valve: Trileaflet; mildly thickened leaflets.   Accurate evaluation of aortic stenosis limited in the   setting of severe mitral  stenosis and decreased   ventricular loadin. Morphologically there does not appear   to be significant stenosis. By gradient (mean PG 21 mmHg)   the stenosis is moderate, by AVA VTI (  0.8) stenosis is   severe. Moderate regurgitation, AR PHT is 439 cm/s. Valve   area: 0.78cm^2(VTI). Valve area: 0.78cm^2 (Vmax). - Mitral valve: The findings are consistent with moderate to   severe stenosis. (Mean PG 12 mmHg, MVA by PHT 1.3. Cannot   use MVA by VTI due to moderate AI. Valve area by pressure   half-time: 1.38cm^2. - Left atrium: The atrium was severely dilated. - Right ventricle: Systolic function was mildly reduced, RV   TAPSE is 1.4, TV systolic annular tissue velocity 10 cm/s. - Pulmonary arteries: Systolic pressure was moderately   increased. PA peak pressure: 58mm Hg (S) (PASP based on a   fairly faint spectral Doppler waveform)     07/2013 TEE Study Conclusions  - Left ventricle: Systolic function was moderately reduced.   The estimated ejection fraction was in the range of 35% to   40%. - Aortic valve: There was mild to moderate stenosis. Mild to   moderate regurgitation. - Mitral valve: The findings are consistent with moderate to   severe stenosis. Valve area by pressure half-time:   1.45cm^2. - Left atrium: There was spontaneous echo contrast   ("smoke"). - Atrial septum: No defect or patent foramen ovale was   identified. - Tricuspid valve: No evidence of vegetation. Transesophageal echocardiography.  2D and color Doppler. Height:  Height: 157.5cm. Height: 62in.  Weight:  Weight: 75kg. Weight: 165lb.  Body mass index:  BMI: 30.2kg/m^2. Body surface area:    BSA: 1.50m^2.  Blood pressure: 143/56.  Patient status:  Inpatient.  Location:  Endoscopy.      06/2018 echo Study Conclusions   - Left ventricle: The cavity size was normal. Wall thickness was   normal. Systolic function was normal. The estimated ejection   fraction was in the range of 60% to 65%. - Aortic  valve: AV prosthesis is difficult to see Peak and mean   gradients through the valve are 18 and 10 mm Hg respectively. - Mitral valve: Peak and mean gradients through the valve   prosthesis are 19 and 4 mm Hg respectively MVA by P T1/2 is 3   cm2. s/p MV prosthesis. Valve prosthesis is difficult to see. - Pulmonary arteries: PA peak pressure: 48 mm Hg (S).     04/2020 echo IMPRESSIONS     1. Left ventricular ejection fraction, by estimation, is 60 to 65%. The  left ventricle has normal function. The left ventricle has no regional  wall motion abnormalities. Left ventricular diastolic parameters are  indeterminate.   2. Right ventricular systolic function is low normal. The right  ventricular size is normal. There is moderately elevated pulmonary artery  systolic pressure. The estimated right ventricular systolic pressure is  45.5 mmHg.   3. Left atrial size was mildly dilated.   4. The mitral valve has been repaired/replaced. No evidence of mitral  valve regurgitation. The mean mitral valve gradient is 5.0 mmHg. There is  a 25 mm Magna Ease bioprosthetic valve present in the mitral position.  Grossly normal function.   5. The aortic valve has been repaired/replaced. Aortic valve  regurgitation is not visualized. There is a 21 mm Magna Ease bioprosthetic  valve present in the aortic position. Aortic valve mean gradient measures  7.0 mmHg. Grossly normal function.   6. The inferior vena cava is normal in size with greater than 50%  respiratory variability, suggesting right atrial pressure of 3 mmHg.     07/2013 Cath Impression: 1. No angiographic evidence of CAD 2.  Elevated filling pressures     01/2021 nuclear stress   Atrial fibrillation present throughout. No diagnostic ST segment changes to indicate ischemia. Small, mild to moderate intensity, mid to apical anterior defect. This is fixed with the exception of a small degree of partial reversibility in the mid zone.  Suggestive of scar versus variable soft tissue attenuation. This is a low risk study. Nuclear stress EF: 50%.   01/2021 carotid US  Summary:  Right Carotid: Velocities in the right ICA are consistent with a 1-39%  stenosis.   Left Carotid: Velocities in the left ICA are consistent with a 1-39%  stenosis.   Vertebrals:  Bilateral vertebral arteries demonstrate antegrade flow.  Subclavians: Normal flow hemodynamics were seen in bilateral subclavian               arteries.             Assessment and Plan  1. Chronic diastolic HF - appears euvolemic today, with uptrending Cr hold torsemide x 2 days and then resume prior dosing - I think her ongoing SOB is more related to her recurrent arrhythmia  2. Afib/aflutter - EKG looks to be atypical aflutter today on my evaluation - she does report palpitations, SOB/DOE - increase bisoprolol to 15mg  daily - have her f/u with Dr Arlester Ladd, from prior visit 04/21/23 would consider for amio if recurrent aflutter. Will ask for EP to evaluate her    3.Valvular heart disease - overall normal MVR and AVR function by most recent echo    Laurann Pollock, M.D.

## 2024-01-15 NOTE — Addendum Note (Signed)
 Addended by: Diannah Rindfleisch G on: 01/15/2024 12:15 PM   Modules accepted: Orders

## 2024-01-18 ENCOUNTER — Encounter

## 2024-01-18 ENCOUNTER — Ambulatory Visit: Attending: Cardiology | Admitting: *Deleted

## 2024-01-18 DIAGNOSIS — Z5181 Encounter for therapeutic drug level monitoring: Secondary | ICD-10-CM | POA: Diagnosis not present

## 2024-01-18 DIAGNOSIS — Z952 Presence of prosthetic heart valve: Secondary | ICD-10-CM

## 2024-01-18 DIAGNOSIS — N1831 Chronic kidney disease, stage 3a: Secondary | ICD-10-CM | POA: Diagnosis not present

## 2024-01-18 DIAGNOSIS — Z853 Personal history of malignant neoplasm of breast: Secondary | ICD-10-CM | POA: Diagnosis not present

## 2024-01-18 DIAGNOSIS — E039 Hypothyroidism, unspecified: Secondary | ICD-10-CM | POA: Diagnosis not present

## 2024-01-18 DIAGNOSIS — E785 Hyperlipidemia, unspecified: Secondary | ICD-10-CM | POA: Diagnosis not present

## 2024-01-18 DIAGNOSIS — I4892 Unspecified atrial flutter: Secondary | ICD-10-CM | POA: Diagnosis not present

## 2024-01-18 DIAGNOSIS — R739 Hyperglycemia, unspecified: Secondary | ICD-10-CM | POA: Diagnosis not present

## 2024-01-18 DIAGNOSIS — I5032 Chronic diastolic (congestive) heart failure: Secondary | ICD-10-CM | POA: Diagnosis not present

## 2024-01-18 DIAGNOSIS — Z0001 Encounter for general adult medical examination with abnormal findings: Secondary | ICD-10-CM | POA: Diagnosis not present

## 2024-01-18 DIAGNOSIS — E1129 Type 2 diabetes mellitus with other diabetic kidney complication: Secondary | ICD-10-CM | POA: Diagnosis not present

## 2024-01-18 LAB — POCT INR: INR: 2.8 (ref 2.0–3.0)

## 2024-01-18 NOTE — Patient Instructions (Signed)
 Continue warfarin 1 tablet daily except 1 1/2 tablets on Sundays and Thursdays Pt is holding off on colonoscopy procedure for now Recheck INR in 4 weeks

## 2024-01-19 ENCOUNTER — Emergency Department (HOSPITAL_COMMUNITY)

## 2024-01-19 ENCOUNTER — Encounter (HOSPITAL_COMMUNITY): Payer: Self-pay

## 2024-01-19 ENCOUNTER — Other Ambulatory Visit: Payer: Self-pay

## 2024-01-19 ENCOUNTER — Emergency Department (HOSPITAL_COMMUNITY)
Admission: EM | Admit: 2024-01-19 | Discharge: 2024-01-20 | Disposition: A | Discharge status: Home / Self Care | Attending: Emergency Medicine | Admitting: Emergency Medicine

## 2024-01-19 DIAGNOSIS — I517 Cardiomegaly: Secondary | ICD-10-CM | POA: Diagnosis not present

## 2024-01-19 DIAGNOSIS — Z7951 Long term (current) use of inhaled steroids: Secondary | ICD-10-CM | POA: Insufficient documentation

## 2024-01-19 DIAGNOSIS — I509 Heart failure, unspecified: Secondary | ICD-10-CM | POA: Diagnosis not present

## 2024-01-19 DIAGNOSIS — E876 Hypokalemia: Secondary | ICD-10-CM | POA: Diagnosis not present

## 2024-01-19 DIAGNOSIS — R0689 Other abnormalities of breathing: Secondary | ICD-10-CM | POA: Diagnosis not present

## 2024-01-19 DIAGNOSIS — R55 Syncope and collapse: Secondary | ICD-10-CM | POA: Diagnosis not present

## 2024-01-19 DIAGNOSIS — E1122 Type 2 diabetes mellitus with diabetic chronic kidney disease: Secondary | ICD-10-CM | POA: Insufficient documentation

## 2024-01-19 DIAGNOSIS — R9089 Other abnormal findings on diagnostic imaging of central nervous system: Secondary | ICD-10-CM | POA: Diagnosis not present

## 2024-01-19 DIAGNOSIS — S069X9A Unspecified intracranial injury with loss of consciousness of unspecified duration, initial encounter: Secondary | ICD-10-CM | POA: Diagnosis not present

## 2024-01-19 DIAGNOSIS — Z7984 Long term (current) use of oral hypoglycemic drugs: Secondary | ICD-10-CM | POA: Diagnosis not present

## 2024-01-19 DIAGNOSIS — J4489 Other specified chronic obstructive pulmonary disease: Secondary | ICD-10-CM | POA: Diagnosis not present

## 2024-01-19 DIAGNOSIS — I130 Hypertensive heart and chronic kidney disease with heart failure and stage 1 through stage 4 chronic kidney disease, or unspecified chronic kidney disease: Secondary | ICD-10-CM | POA: Diagnosis not present

## 2024-01-19 DIAGNOSIS — R110 Nausea: Secondary | ICD-10-CM | POA: Diagnosis not present

## 2024-01-19 DIAGNOSIS — Z853 Personal history of malignant neoplasm of breast: Secondary | ICD-10-CM | POA: Diagnosis not present

## 2024-01-19 DIAGNOSIS — R918 Other nonspecific abnormal finding of lung field: Secondary | ICD-10-CM | POA: Diagnosis not present

## 2024-01-19 DIAGNOSIS — Z79899 Other long term (current) drug therapy: Secondary | ICD-10-CM | POA: Insufficient documentation

## 2024-01-19 DIAGNOSIS — S0100XA Unspecified open wound of scalp, initial encounter: Secondary | ICD-10-CM | POA: Insufficient documentation

## 2024-01-19 DIAGNOSIS — W19XXXA Unspecified fall, initial encounter: Secondary | ICD-10-CM | POA: Insufficient documentation

## 2024-01-19 DIAGNOSIS — S0990XA Unspecified injury of head, initial encounter: Secondary | ICD-10-CM | POA: Diagnosis not present

## 2024-01-19 DIAGNOSIS — E039 Hypothyroidism, unspecified: Secondary | ICD-10-CM | POA: Insufficient documentation

## 2024-01-19 DIAGNOSIS — Z7901 Long term (current) use of anticoagulants: Secondary | ICD-10-CM | POA: Insufficient documentation

## 2024-01-19 DIAGNOSIS — N182 Chronic kidney disease, stage 2 (mild): Secondary | ICD-10-CM | POA: Insufficient documentation

## 2024-01-19 DIAGNOSIS — S199XXA Unspecified injury of neck, initial encounter: Secondary | ICD-10-CM | POA: Diagnosis not present

## 2024-01-19 DIAGNOSIS — Z471 Aftercare following joint replacement surgery: Secondary | ICD-10-CM | POA: Diagnosis not present

## 2024-01-19 DIAGNOSIS — G4489 Other headache syndrome: Secondary | ICD-10-CM | POA: Diagnosis not present

## 2024-01-19 LAB — URINALYSIS, ROUTINE W REFLEX MICROSCOPIC
Bilirubin Urine: NEGATIVE
Glucose, UA: >=500 mg/dL — AB
Hgb urine dipstick: NEGATIVE
Ketones, ur: NEGATIVE mg/dL
Leukocytes,Ua: NEGATIVE
Nitrite: NEGATIVE
Protein, ur: NEGATIVE mg/dL
Specific Gravity, Urine: 1.002 — ABNORMAL LOW (ref 1.005–1.030)
pH: 6 (ref 5.0–8.0)

## 2024-01-19 LAB — CBC
HCT: 39.6 % (ref 36.0–46.0)
Hemoglobin: 11.7 g/dL — ABNORMAL LOW (ref 12.0–15.0)
MCH: 25.2 pg — ABNORMAL LOW (ref 26.0–34.0)
MCHC: 29.5 g/dL — ABNORMAL LOW (ref 30.0–36.0)
MCV: 85.3 fL (ref 80.0–100.0)
Platelets: 157 10*3/uL (ref 150–400)
RBC: 4.64 MIL/uL (ref 3.87–5.11)
RDW: 16.6 % — ABNORMAL HIGH (ref 11.5–15.5)
WBC: 6 10*3/uL (ref 4.0–10.5)
nRBC: 0 % (ref 0.0–0.2)

## 2024-01-19 LAB — TROPONIN I (HIGH SENSITIVITY)
Troponin I (High Sensitivity): 21 ng/L — ABNORMAL HIGH (ref <18)
Troponin I (High Sensitivity): 21 ng/L — ABNORMAL HIGH (ref <18)

## 2024-01-19 LAB — BASIC METABOLIC PANEL WITH GFR
Anion gap: 15 (ref 5–15)
BUN: 15 mg/dL (ref 8–23)
CO2: 24 mmol/L (ref 22–32)
Calcium: 8.3 mg/dL — ABNORMAL LOW (ref 8.9–10.3)
Chloride: 96 mmol/L — ABNORMAL LOW (ref 98–111)
Creatinine, Ser: 1.59 mg/dL — ABNORMAL HIGH (ref 0.44–1.00)
GFR, Estimated: 33 mL/min — ABNORMAL LOW (ref >60)
Glucose, Bld: 123 mg/dL — ABNORMAL HIGH (ref 70–99)
Potassium: 3.1 mmol/L — ABNORMAL LOW (ref 3.5–5.1)
Sodium: 135 mmol/L (ref 135–145)

## 2024-01-19 LAB — RESP PANEL BY RT-PCR (RSV, FLU A&B, COVID)  RVPGX2
Influenza A by PCR: NEGATIVE
Influenza B by PCR: NEGATIVE
Resp Syncytial Virus by PCR: NEGATIVE
SARS Coronavirus 2 by RT PCR: NEGATIVE

## 2024-01-19 LAB — PROTIME-INR
INR: 2.9 — ABNORMAL HIGH (ref 0.8–1.2)
Prothrombin Time: 30.4 s — ABNORMAL HIGH (ref 11.4–15.2)

## 2024-01-19 LAB — CBG MONITORING, ED: Glucose-Capillary: 130 mg/dL — ABNORMAL HIGH (ref 70–99)

## 2024-01-19 LAB — CK: Total CK: 568 U/L — ABNORMAL HIGH (ref 38–234)

## 2024-01-19 MED ORDER — PROCHLORPERAZINE EDISYLATE 10 MG/2ML IJ SOLN
5.0000 mg | Freq: Once | INTRAMUSCULAR | Status: AC
  Administered 2024-01-19: 5 mg via INTRAVENOUS
  Filled 2024-01-19: qty 2

## 2024-01-19 MED ORDER — DIPHENHYDRAMINE HCL 50 MG/ML IJ SOLN
25.0000 mg | Freq: Once | INTRAMUSCULAR | Status: AC
  Administered 2024-01-19: 25 mg via INTRAVENOUS
  Filled 2024-01-19: qty 1

## 2024-01-19 MED ORDER — SODIUM CHLORIDE 0.9 % IV BOLUS
1000.0000 mL | Freq: Once | INTRAVENOUS | Status: AC
  Administered 2024-01-19: 1000 mL via INTRAVENOUS

## 2024-01-19 NOTE — Discharge Instructions (Addendum)
 Based on the events which brought you to the ER today, it is possible that you may have a concussion. A concussion occurs when there is a blow to the head or body, with enough force to shake the brain and disrupt how the brain functions. You may experience symptoms such as headaches, sensitivity to light/noise, dizziness, cognitive slowing, difficulty concentrating / remembering, trouble sleeping and drowsiness. These symptoms may last anywhere from hours/days to potentially weeks/months. While these symptoms are very frustrating and perhaps debilitating, it is important that you remember that they will improve over time. Everyone has a different rate of recovery; it is difficult to predict when your symptoms will resolve. In order to allow for your brain to heal after the injury, we recommend that you see your primary physician or a physician knowledgeable in concussion management. We also advise you to let your body and brain rest: avoid physical activities (sports, gym, and exercise) and reduce cognitive demands (reading, texting, TV watching, computer use, video games, etc). School attendance, after-school activities and work may need to be modified to avoid increasing symptoms. We recommend against driving until until all symptoms have resolvedCome back to the ER right away if you are having repeated episodes of vomiting, severe/worsening headache/dizziness or any other symptom that alarms you. We recommended that someone stay with you for the next 24 hours to monitor for these worrisome symptoms.   Be sure to drink plenty of fluids over the next few days  Follow up with your pcp next week for recheck  Please return to the emergency department for any worsening or worrisome symptoms.

## 2024-01-19 NOTE — ED Triage Notes (Signed)
 [facility] from home. Cc of LOC in kitchen. Hit head on floor. On coumadin . Has been vomiting ever since she fell. Hasn't felt good all day. Has knot to back of head. Denies pain 168 cbg with ems.  20l ac 4mg  zofran .

## 2024-01-19 NOTE — ED Notes (Signed)
 Pt ambulated to bathroom x 1A ( uses cane and walker at home)  No complaints going to the bathroom, But became dizzi walking back to the room

## 2024-01-19 NOTE — ED Provider Notes (Signed)
 [place] EMERGENCY DEPARTMENT AT [facility] Provider Note  CSN: [id number] Arrival date & time: 01/19/24 1918  Chief Complaint(s) Loss of Consciousness  HPI [name] is a 78 y.o. female with past medical history as below, significant for afib on warfarin, ckd, cva, hld, htn, a flutter who presents to the ED with complaint of fall/loc  Patient presents with syncope, head injury, she is anticoagulated on warfarin.  She does not recall the circumstances of her fall/syncope.  She does have a headache, pain to the back of her head.  She was not incontinent.  She has been vomiting since the fall.  Zofran  en route by EMS with mild improvement to her nausea.  Reports she has been feeling unwell today, generalized malaise.  No fevers or chills, no change in bowel or bladder function, no chest pain, dyspnea abdominal pain. She is amnesic to the fall  Per spouse at bedside pt was taking her regular medications, had a coughing fit, her eyes rolled to the back of her head and she fell to the ground, had LOC <1 minute. No obvious seizure like activity. She was able to stand afterwards and reported that she needed to vomit.   Past Medical History Past Medical History:  Diagnosis Date   Anemia    Asthma    Atrial fibrillation (HCC)    Breast carcinoma (HCC) 1995   1995   Cardioembolic stroke (HCC) 01/2012   Right frontal in 01/2012; normal carotid ultrasound; possible LAA thrombus by TEE; virtual complete neurologic recovery   Chronic kidney disease, stage 2, mildly decreased GFR    GFR of approximately 60   Depression    Diabetes mellitus without complication (HCC)    Diverticulosis of colon (without mention of hemorrhage) 2012   Dr. Homero Luster   Fasting hyperglycemia    120 fasting   Gastroesophageal reflux disease    Gastroparesis    Hemorrhoids    Hyperlipidemia    Hypertension    pt denies 05/30/13     Dr Roann Chestnut   Hyponatremia    Rheumatic heart disease    a.  07/2013 Echo: Ef 55-60%, Mod AS/AI, Mod-Sev MS, sev dil LA, PASP 58;  b. 07/2013 TEE: EF 35-40%, mild-mod AS/AI, mod-sev MS, LA smoke;  c. 07/2013 Cath: elev R heart pressures, nl cors.   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   Asthma exacerbation 11/12/2016   Synovial cyst of lumbar spine 05/13/2014   Encounter for therapeutic drug monitoring 11/06/2013   Hyponatremia    Atrial fibrillation (HCC)    Moderate aortic stenosis 07/12/2013   Moderate aortic insufficiency 07/12/2013   Status post aortic valve and mitral valve replacement 07/12/2013   Severe mitral valve stenosis 07/09/2013   Mitral stenosis 07/08/2013   Chest pain 07/07/2013   Asthma, cough variant 03/27/2013   Upper airway cough syndrome in pt with mild cough variant asthma  09/17/2012   Dyspnea on exertion 06/13/2012   Edema 06/13/2012   Rheumatic heart disease 06/13/2012   Fasting hyperglycemia  Chronic anticoagulation 01/26/2012   Hypothyroidism 01/23/2012   Cardioembolic stroke (HCC) 01/02/2012   Hyperlipidemia    Essential hypertension    Breast carcinoma (HCC)    Gastroesophageal reflux disease    Home Medication(s) Prior to Admission medications   Medication Sig Start Date End Date Taking? Authorizing Provider  albuterol  (PROVENTIL ) (2.5 MG/3ML) 0.083% nebulizer solution USE 1 VIAL IN NEBULIZER EVERY4 HOURS AS NEEDED. 01/16/23   Diamond Formica, MD  albuterol  (VENTOLIN  HFA) 108 (90 Base) MCG/ACT inhaler Inhale 2 puffs into the lungs every 6 (six) hours as  needed for wheezing or shortness of breath. 08/17/23   [provider]  atorvastatin  (LIPITOR) 40 MG tablet TAKE ONE TABLET BY MOUTH ONCE DAILY. 07/24/23   Laurann Pollock, MD  bisoprolol  (ZEBETA ) 10 MG tablet Take 1 tablet (10 mg total) by mouth daily. + 5mg  tab to equal new 15mg  dose. 01/15/24   Laurann Pollock, MD  bisoprolol  (ZEBETA ) 5 MG tablet Take 1 tablet (5 mg total) by mouth daily. + 10mg  tab to equal new 15mg  dose. 01/15/24   Laurann Pollock, MD  budesonide -formoterol  (SYMBICORT ) 80-4.5 MCG/ACT inhaler Take 2 puffs first thing in am and then another 2 puffs about 12 hours later. 08/10/23   Diamond Formica, MD  dextromethorphan -guaiFENesin  (MUCINEX  DM) 30-600 MG 12hr tablet Take 1 tablet by mouth 2 (two) times daily as needed for cough.    [provider]  escitalopram  (LEXAPRO ) 10 MG tablet Take 10 mg by mouth daily with breakfast. 04/10/15   [provider]  famotidine  (PEPCID ) 20 MG tablet TAKE ONE TABLET BY MOUTH EVERY DAY AFTER SUPPER 12/25/23   Wert, Michael B, MD  FARXIGA  10 MG TABS tablet Take 10 mg by mouth daily. 03/28/23   [provider]  fexofenadine (ALLEGRA) 180 MG tablet Take 180 mg by mouth in the morning.    [provider]  fluticasone  (FLONASE ) 50 MCG/ACT nasal spray Place 1 spray into both nostrils daily. 09/05/13   Laurann Pollock, MD  gabapentin  (NEURONTIN ) 300 MG capsule Take 1 capsule (300 mg total) by mouth 2 (two) times daily. 01/05/24   Diamond Formica, MD  levothyroxine  (SYNTHROID ) 100 MCG tablet Take 100 mcg by mouth daily before breakfast.    [provider]  metFORMIN  (GLUCOPHAGE ) 500 MG tablet Take 1 tablet (500 mg total) by mouth 2 (two) times daily with a meal. 08/03/13   Collins, Ahmed Hough, PA-C  Multiple Vitamin (MULITIVITAMIN WITH MINERALS) TABS Take 1 tablet by mouth daily.    [provider]  pantoprazole  (PROTONIX ) 40 MG tablet TAKE 1 TABLET 30 TO 60 MINUTES BEFORE FIRST MEAL OF THE DAY.  10/30/23   Diamond Formica, MD  potassium chloride  SA (KLOR-CON  M) 20 MEQ tablet Take 1 tablet (20 mEq total) by mouth daily. 11/14/22 12/29/23  Rai, Hurman Maiden, MD  torsemide  (DEMADEX ) 20 MG tablet Take 1 tablet (20 mg total) by mouth daily. (May take an additional 20mg  as needed for swelling) 12/04/23   Laurann Pollock, MD  warfarin (COUMADIN ) 2.5 MG tablet TAKE AS DIRECTED BY COUMADIN  CLINIC. 11/20/23   Laurann Pollock, MD  Past Surgical History Past Surgical History:  Procedure Laterality Date   AORTIC VALVE REPLACEMENT N/A 07/18/2013   Procedure: AORTIC VALVE REPLACEMENT (AVR);  Surgeon: Bartley Lightning, MD;  Location: [facility] OR;  Service: Open Heart Surgery;  Laterality: N/A;   BACK SURGERY     BREAST LUMPECTOMY Right 1995   CARDIAC CATHETERIZATION     CARDIOVERSION N/A 07/26/2013   Procedure: CARDIOVERSION;  Surgeon: Elmyra Haggard, MD;  Location: [facility] ENDOSCOPY;  Service: Cardiovascular;  Laterality: N/A;   CARDIOVERSION N/A 01/04/2023   Procedure: CARDIOVERSION;  Surgeon: Sonny Dust, MD;  Location: [facility] ENDOSCOPY;  Service: Cardiovascular;  Laterality: N/A;   COLONOSCOPY  2012   Negative screening procedure   DILATION AND CURETTAGE OF UTERUS     ESOPHAGEAL MANOMETRY N/A 06/17/2013   Procedure: ESOPHAGEAL MANOMETRY (EM);  Surgeon: Tessa Figures, MD;  Location: [facility] ENDOSCOPY;  Service: Endoscopy;  Laterality: N/A;   INTRAOPERATIVE TRANSESOPHAGEAL ECHOCARDIOGRAM N/A 07/18/2013   Procedure: INTRAOPERATIVE TRANSESOPHAGEAL ECHOCARDIOGRAM;  Surgeon: Bartley Lightning, MD;  Location: [facility] OR;  Service: Open Heart Surgery;  Laterality: N/A;   LEFT AND RIGHT HEART CATHETERIZATION WITH CORONARY ANGIOGRAM N/A 07/10/2013   Procedure: LEFT AND RIGHT HEART CATHETERIZATION WITH CORONARY ANGIOGRAM;  Surgeon: Odie Benne, MD;  Location: [facility] CATH LAB;  Service:  Cardiovascular;  Laterality: N/A;   LUMBAR LAMINECTOMY/DECOMPRESSION MICRODISCECTOMY Right 05/13/2014   Procedure: LUMBAR LAMINECTOMY/DECOMPRESSION MICRODISCECTOMY 1 LEVEL  lumbar four/five;  Surgeon: Augustine Blocker, MD;  Location: [facility] NEURO ORS;  Service: Neurosurgery;  Laterality: Right;   MITRAL VALVE REPLACEMENT N/A 07/18/2013   Procedure: MITRAL VALVE (MV) REPLACEMENT;  Surgeon: Bartley Lightning, MD;  Location: [facility] OR;  Service: Open Heart Surgery;  Laterality: N/A;   MITRAL VALVE SURGERY  [facility], closed mitral valvulotomy by finger fracture   TEE WITHOUT CARDIOVERSION  01/24/2012   Procedure: TRANSESOPHAGEAL ECHOCARDIOGRAM (TEE);  Surgeon: Lenise Quince, MD;  Location: [facility] ENDOSCOPY;  Service: Cardiovascular;  Laterality: N/A;   TEE WITHOUT CARDIOVERSION N/A 07/11/2013   Procedure: TRANSESOPHAGEAL ECHOCARDIOGRAM (TEE);  Surgeon: Lake Pilgrim, MD;  Location: [facility] ENDOSCOPY;  Service: Cardiovascular;  Laterality: N/A;   TEE WITHOUT CARDIOVERSION N/A 07/26/2013   Procedure: TRANSESOPHAGEAL ECHOCARDIOGRAM (TEE);  Surgeon: Elmyra Haggard, MD;  Location: [facility] ENDOSCOPY;  Service: Cardiovascular;  Laterality: N/A;   TUBAL LIGATION  1973   Family History Family History  Problem Relation Age of Onset   Heart disease Brother 83       MI   Rheum arthritis Maternal Grandmother    Asthma Maternal Grandfather    Hypothyroidism Mother    Diabetes Sister    Cirrhosis Father     Social History Social History   Tobacco Use   Smoking status: Never   Smokeless tobacco: Never  Vaping Use   Vaping status: Never Used  Substance Use Topics   Alcohol use: No    Alcohol/week: 0.0 standard drinks of alcohol   Drug use: No   Allergies Amiodarone   Review of Systems A thorough review of systems was obtained and all systems are negative except as noted in the HPI and PMH.   Physical Exam Vital Signs  I have reviewed the triage vital signs BP 117/85   Pulse 95   Temp 97.6 F (36.4 C)    Resp 20   Ht 5\' 2"  (1.575 m)   Wt 73.9 kg   SpO2 96%   BMI 29.81 kg/m  Physical Exam Vitals and nursing note reviewed.  Constitutional:      General: She is not in acute distress.    Appearance: Normal appearance.  HENT:     Head: Normocephalic.     Comments: Wound occiput    Right Ear: External ear normal.     Left Ear: External ear normal.     Nose: Nose normal.     Mouth/Throat:     Mouth: Mucous membranes are moist.  Eyes:     General: No scleral icterus.       Right eye: No discharge.        Left eye: No discharge.  Cardiovascular:     Rate and Rhythm: Normal rate and regular rhythm.     Pulses: Normal pulses.     Heart sounds: Normal heart sounds.  Pulmonary:     Effort: Pulmonary effort is normal. No respiratory distress.     Breath sounds: Normal breath sounds. No stridor.  Abdominal:     General: Abdomen is flat. There is no distension.     Palpations: Abdomen is soft.     Tenderness: There is no abdominal tenderness.  Musculoskeletal:     Cervical back: No rigidity.     Right lower leg: No edema.     Left lower leg: No edema.  Skin:    General: Skin is warm and dry.     Capillary Refill: Capillary refill takes less than 2 seconds.  Neurological:     Mental Status: She is alert and oriented to person, place, and time.     GCS: GCS eye subscore is 4. GCS verbal subscore is 5. GCS motor subscore is 6.     Cranial Nerves: Cranial nerves 2-12 are intact.     Sensory: Sensation is intact.     Motor: Motor function is intact.     Coordination: Coordination is intact.     Comments: Gait testing deferred secondary to patient safety.   Psychiatric:        Mood and Affect: Mood normal.        Behavior: Behavior normal. Behavior is cooperative.     ED Results and Treatments Labs (all labs ordered are listed, but only abnormal results are displayed) Labs Reviewed  BASIC METABOLIC PANEL WITH GFR  CBC  URINALYSIS, ROUTINE W REFLEX MICROSCOPIC  PROTIME-INR   CBG MONITORING, ED  TROPONIN I (HIGH SENSITIVITY)                                                                                                                          Radiology No results found.  Pertinent labs & imaging results that were available during my care of the patient were reviewed by me and considered in my medical decision making (see MDM for details).  Medications Ordered in ED Medications - No data to display  Procedures Procedures  (including critical care time)  Medical Decision Making / ED Course    Medical Decision Making:    [name] is a 78 y.o. female with past medical history as below, significant for afib on warfarin, ckd, cva, hld, htn, a flutter who presents to the ED with complaint of fall/loc. The complaint involves an extensive differential diagnosis and also carries with it a high risk of complications and morbidity.  Serious etiology was considered. Ddx includes but is not limited to: Differential diagnoses for head trauma includes subdural hematoma, epidural hematoma, acute concussion, traumatic subarachnoid hemorrhage, cerebral contusions, etc.   Complete initial physical exam performed, notably the patient was in no acute distress, neuroexam is nonfocal, pupils equal bilateral, nausea improved after Zofran ..    Reviewed and confirmed nursing documentation for past medical history, family history, social history.  Vital signs reviewed.    ***  Clinical Course as of 01/19/24 2301  Fri Jan 19, 2024  2126 Creatinine(!): 1.59 Similar to prior  [SG]  2130 CK Total(!): 568 Cr is stable, give fluids  [SG]    Clinical Course User Index [SG] Teddi Favors, DO    Brief summary: 78 year old female history as above including A-fib on Coumadin , prior stroke here with syncope, head injury.  She is amnesic to the  fall/syncope.  Neuroexam is nonfocal, she is nauseated, was having vomiting prior to arrival.  Will check screening labs, get emergent CT imaging.                Additional history obtained: -Additional history obtained from {wsadditionalhistorian:28072} -External records from outside source obtained and reviewed including: Chart review including previous notes, labs, imaging, consultation notes including  ***   Lab Tests: -I ordered, reviewed, and interpreted labs.   The pertinent results include:   Labs Reviewed  BASIC METABOLIC PANEL WITH GFR  CBC  URINALYSIS, ROUTINE W REFLEX MICROSCOPIC  PROTIME-INR  CBG MONITORING, ED  TROPONIN I (HIGH SENSITIVITY)    Notable for ***  EKG   EKG Interpretation Date/Time:    Ventricular Rate:    PR Interval:    QRS Duration:    QT Interval:    QTC Calculation:   R Axis:      Text Interpretation:           Imaging Studies ordered: I ordered imaging studies including *** I independently visualized the following imaging with scope of interpretation limited to determining acute life threatening conditions related to emergency care; findings noted above I independently visualized and interpreted imaging. I agree with the radiologist interpretation   Medicines ordered and prescription drug management: No orders of the defined types were placed in this encounter.   -I have reviewed the patients home medicines and have made adjustments as needed   Consultations Obtained: I requested consultation with the ***,  and discussed lab and imaging findings as well as pertinent plan - they recommend: ***   Cardiac Monitoring: The patient was maintained on a cardiac monitor.  I personally viewed and interpreted the cardiac monitored which showed an underlying rhythm of: *** Continuous pulse oximetry interpreted by myself, ***% on ***.    Social Determinants of Health:  Diagnosis or treatment significantly limited by social  determinants of health: {wssoc:28071}   Reevaluation: After the interventions noted above, I reevaluated the patient and found that they have {resolved/improved/worsened:23923::"improved"}  Co morbidities that complicate the patient evaluation  Past Medical History:  Diagnosis Date   Anemia  Asthma    Atrial fibrillation (HCC)    Breast carcinoma (HCC) 1995   1995   Cardioembolic stroke (HCC) 01/2012   Right frontal in 01/2012; normal carotid ultrasound; possible LAA thrombus by TEE; virtual complete neurologic recovery   Chronic kidney disease, stage 2, mildly decreased GFR    GFR of approximately 60   Depression    Diabetes mellitus without complication (HCC)    Diverticulosis of colon (without mention of hemorrhage) 2012   Dr. Homero Luster   Fasting hyperglycemia    120 fasting   Gastroesophageal reflux disease    Gastroparesis    Hemorrhoids    Hyperlipidemia    Hypertension    pt denies 05/30/13     Dr Roann Chestnut   Hyponatremia    Rheumatic heart disease    a. 07/2013 Echo: Ef 55-60%, Mod AS/AI, Mod-Sev MS, sev dil LA, PASP 58;  b. 07/2013 TEE: EF 35-40%, mild-mod AS/AI, mod-sev MS, LA smoke;  c. 07/2013 Cath: elev R heart pressures, nl cors.   Shortness of breath       Dispostion: Disposition decision including need for hospitalization was considered, and patient {wsdispo:28070::"discharged from emergency department."}    Final Clinical Impression(s) / ED Diagnoses Final diagnoses:  None

## 2024-01-19 NOTE — ED Notes (Signed)
Gave patient ice water.

## 2024-01-19 NOTE — ED Notes (Signed)
 Patient to CT.

## 2024-01-20 MED ORDER — POTASSIUM CHLORIDE CRYS ER 20 MEQ PO TBCR
40.0000 meq | EXTENDED_RELEASE_TABLET | Freq: Once | ORAL | Status: AC
  Administered 2024-01-20: 40 meq via ORAL
  Filled 2024-01-20: qty 2

## 2024-01-20 MED ORDER — ONDANSETRON HCL 4 MG PO TABS: 4.0000 mg | ORAL_TABLET | ORAL | 0 refills | Status: DC | PRN

## 2024-01-20 MED ORDER — ACETAMINOPHEN 325 MG PO TABS: 650.0000 mg | ORAL_TABLET | Freq: Four times a day (QID) | ORAL | 0 refills | Status: AC | PRN

## 2024-01-23 DIAGNOSIS — R55 Syncope and collapse: Secondary | ICD-10-CM | POA: Diagnosis not present

## 2024-01-23 DIAGNOSIS — N183 Chronic kidney disease, stage 3 unspecified: Secondary | ICD-10-CM | POA: Diagnosis not present

## 2024-01-23 DIAGNOSIS — Z7901 Long term (current) use of anticoagulants: Secondary | ICD-10-CM | POA: Diagnosis not present

## 2024-01-25 ENCOUNTER — Encounter (HOSPITAL_COMMUNITY): Payer: Self-pay

## 2024-01-25 ENCOUNTER — Other Ambulatory Visit: Payer: Self-pay

## 2024-01-25 ENCOUNTER — Emergency Department (HOSPITAL_COMMUNITY)

## 2024-01-25 ENCOUNTER — Emergency Department (HOSPITAL_COMMUNITY)
Admission: EM | Admit: 2024-01-25 | Discharge: 2024-01-25 | Disposition: A | Discharge status: Home / Self Care | Attending: Emergency Medicine | Admitting: Emergency Medicine

## 2024-01-25 DIAGNOSIS — Z7901 Long term (current) use of anticoagulants: Secondary | ICD-10-CM | POA: Insufficient documentation

## 2024-01-25 DIAGNOSIS — R911 Solitary pulmonary nodule: Secondary | ICD-10-CM | POA: Diagnosis not present

## 2024-01-25 DIAGNOSIS — Z7984 Long term (current) use of oral hypoglycemic drugs: Secondary | ICD-10-CM | POA: Insufficient documentation

## 2024-01-25 DIAGNOSIS — R079 Chest pain, unspecified: Secondary | ICD-10-CM | POA: Diagnosis not present

## 2024-01-25 DIAGNOSIS — I517 Cardiomegaly: Secondary | ICD-10-CM | POA: Diagnosis not present

## 2024-01-25 DIAGNOSIS — J441 Chronic obstructive pulmonary disease with (acute) exacerbation: Secondary | ICD-10-CM | POA: Diagnosis not present

## 2024-01-25 DIAGNOSIS — R918 Other nonspecific abnormal finding of lung field: Secondary | ICD-10-CM | POA: Diagnosis not present

## 2024-01-25 DIAGNOSIS — Z7951 Long term (current) use of inhaled steroids: Secondary | ICD-10-CM | POA: Insufficient documentation

## 2024-01-25 DIAGNOSIS — R0602 Shortness of breath: Secondary | ICD-10-CM | POA: Diagnosis not present

## 2024-01-25 DIAGNOSIS — J90 Pleural effusion, not elsewhere classified: Secondary | ICD-10-CM | POA: Diagnosis not present

## 2024-01-25 LAB — CBC
HCT: 39.8 % (ref 36.0–46.0)
Hemoglobin: 11.8 g/dL — ABNORMAL LOW (ref 12.0–15.0)
MCH: 25.5 pg — ABNORMAL LOW (ref 26.0–34.0)
MCHC: 29.6 g/dL — ABNORMAL LOW (ref 30.0–36.0)
MCV: 86.1 fL (ref 80.0–100.0)
Platelets: 160 10*3/uL (ref 150–400)
RBC: 4.62 MIL/uL (ref 3.87–5.11)
RDW: 17 % — ABNORMAL HIGH (ref 11.5–15.5)
WBC: 5.8 10*3/uL (ref 4.0–10.5)
nRBC: 0 % (ref 0.0–0.2)

## 2024-01-25 LAB — HEPATIC FUNCTION PANEL
ALT: 28 U/L (ref 0–44)
AST: 33 U/L (ref 15–41)
Albumin: 3.9 g/dL (ref 3.5–5.0)
Alkaline Phosphatase: 49 U/L (ref 38–126)
Bilirubin, Direct: 0.2 mg/dL (ref 0.0–0.2)
Indirect Bilirubin: 0.6 mg/dL (ref 0.3–0.9)
Total Bilirubin: 0.8 mg/dL (ref 0.0–1.2)
Total Protein: 6.7 g/dL (ref 6.5–8.1)

## 2024-01-25 LAB — BASIC METABOLIC PANEL WITH GFR
Anion gap: 9 (ref 5–15)
BUN: 17 mg/dL (ref 8–23)
CO2: 24 mmol/L (ref 22–32)
Calcium: 9.7 mg/dL (ref 8.9–10.3)
Chloride: 102 mmol/L (ref 98–111)
Creatinine, Ser: 1.66 mg/dL — ABNORMAL HIGH (ref 0.44–1.00)
GFR, Estimated: 31 mL/min — ABNORMAL LOW (ref >60)
Glucose, Bld: 109 mg/dL — ABNORMAL HIGH (ref 70–99)
Potassium: 4.2 mmol/L (ref 3.5–5.1)
Sodium: 135 mmol/L (ref 135–145)

## 2024-01-25 LAB — TROPONIN I (HIGH SENSITIVITY)
Troponin I (High Sensitivity): 18 ng/L — ABNORMAL HIGH (ref <18)
Troponin I (High Sensitivity): 16 ng/L (ref <18)

## 2024-01-25 LAB — BRAIN NATRIURETIC PEPTIDE: B Natriuretic Peptide: 775 pg/mL — ABNORMAL HIGH (ref 0.0–100.0)

## 2024-01-25 MED ORDER — PREDNISONE 10 MG PO TABS: 20.0000 mg | ORAL_TABLET | Freq: Every day | ORAL | 0 refills | Status: DC

## 2024-01-25 MED ORDER — PREDNISONE 50 MG PO TABS
60.0000 mg | ORAL_TABLET | Freq: Once | ORAL | Status: AC
  Administered 2024-01-25: 60 mg via ORAL
  Filled 2024-01-25: qty 1

## 2024-01-25 MED ORDER — IOHEXOL 350 MG/ML SOLN
64.0000 mL | Freq: Once | INTRAVENOUS | Status: AC | PRN
  Administered 2024-01-25: 64 mL via INTRAVENOUS

## 2024-01-25 NOTE — ED Triage Notes (Addendum)
 Pt has been having difficulty breathing since this morning, pt states its gotten worse since 1730. Pt states she feels she can't catch her breathe. States she was here last week for the same thing. States she has been coughing. Pt states she does have some generalized mild chest pain.

## 2024-01-25 NOTE — Discharge Instructions (Signed)
 Follow-up with your lung doctor next week for recheck

## 2024-01-26 ENCOUNTER — Ambulatory Visit: Payer: Self-pay | Admitting: Internal Medicine

## 2024-01-26 NOTE — Telephone Encounter (Signed)
 Patient scheduled for OV 02/28/24 Nothing further needed at this time.

## 2024-01-26 NOTE — Telephone Encounter (Signed)
 Chief Complaint: SOB ongoing for 10-12 years, 1.5 week ago got worse  Symptoms: 94% O2 saturation on room air, trouble catching breath with movement," pt cannot walk without "giving out," coughing  Disposition:  [x] Appointment(In office)  Additional Notes: Spoke with pt's husband, [name]. Pt went to ED last night for COPD exacerbation. Pt husband states they didn't really find out anything. Pt was told her emphysema has gotten worse. Pt has not gotten worse from last night until today. Pt scheduled for first available appt at [facility]. Pt added to waitlist for any cancellations. Pt husband prefers [place] location. This RN educated pt husband on new-worsening symptoms and when to call back/seek emergent care. Pt verbalized understanding and agrees to plan.     Copied from CRM [id number]. Topic: Clinical - Red Word Triage >> Jan 26, 2024 10:19 AM Tyronne Galloway wrote: Red Word that prompted transfer to Nurse Triage: Pt's spouse [name] is calling for pt. [name] stated the patient is having shortness of breath and worsening coughing from emphysema in the last week and a half. Reason for Disposition  [1] MILD longstanding difficulty breathing AND [2]  SAME as normal  Answer Assessment - Initial Assessment Questions RESPIRATORY STATUS: "Describe your breathing?" (e.g., wheezing, shortness of breath, unable to speak, severe coughing)      SOB ONSET: "When did this breathing problem begin?"      10-12 years ago, 1.5 week ago got worse PATTERN "Does the difficult breathing come and go, or has it been constant since it started?"      Comes and goes, getting more constant SEVERITY: "How bad is your breathing?" (e.g., mild, moderate, severe)    Mainly when moving O2 SATURATION MONITOR:  "Do you use an oxygen  saturation monitor (pulse oximeter) at home?" If Yes, ask: "What is your reading (oxygen  level) today?" "What is your usual oxygen  saturation reading?" (e.g., 95%)       94%; not on oxygen  at  home  Protocols used: Breathing Difficulty-A-AH

## 2024-01-26 NOTE — ED Provider Notes (Signed)
 [place] EMERGENCY DEPARTMENT AT [facility] Provider Note   CSN: [id number] Arrival date & time: 01/25/24  1821     History  Chief Complaint  Patient presents with   Shortness of Breath    [name] is a 78 y.o. female.  Patient has been complaining of shortness of breath for a number of weeks.  She has a history of COPD  The history is provided by the patient and medical records. No language interpreter was used.  Shortness of Breath Severity:  Mild Onset quality:  Sudden Timing:  Constant Progression:  Waxing and waning Chronicity:  New Context: activity   Relieved by:  Nothing Worsened by:  Nothing Ineffective treatments:  None tried Associated symptoms: no abdominal pain, no chest pain, no cough, no headaches and no rash        Home Medications Prior to Admission medications   Medication Sig Start Date End Date Taking? Authorizing Provider  predniSONE  (DELTASONE ) 10 MG tablet Take 2 tablets (20 mg total) by mouth daily. 01/25/24  Yes Junice Fei, MD  acetaminophen  (TYLENOL ) 325 MG tablet Take 2 tablets (650 mg total) by mouth every 6 (six) hours as needed. 01/20/24   Teddi Favors, DO  albuterol  (PROVENTIL ) (2.5 MG/3ML) 0.083% nebulizer solution USE 1 VIAL IN NEBULIZER EVERY4 HOURS AS NEEDED. 01/16/23   Diamond Formica, MD  albuterol  (VENTOLIN  HFA) 108 (90 Base) MCG/ACT inhaler Inhale 2 puffs into the lungs every 6 (six) hours as needed for wheezing or shortness of breath. 08/17/23   [provider]  atorvastatin  (LIPITOR) 40 MG tablet TAKE ONE TABLET BY MOUTH ONCE DAILY. 07/24/23   Laurann Pollock, MD  bisoprolol  (ZEBETA ) 10 MG tablet Take 1 tablet (10 mg total) by mouth daily. + 5mg  tab to equal new 15mg  dose. 01/15/24   Laurann Pollock, MD  bisoprolol  (ZEBETA ) 5 MG tablet Take 1 tablet (5 mg total) by mouth daily. + 10mg  tab to equal new 15mg  dose. 01/15/24   Laurann Pollock, MD  budesonide -formoterol  (SYMBICORT ) 80-4.5 MCG/ACT  inhaler Take 2 puffs first thing in am and then another 2 puffs about 12 hours later. 08/10/23   Diamond Formica, MD  dextromethorphan -guaiFENesin  (MUCINEX  DM) 30-600 MG 12hr tablet Take 1 tablet by mouth 2 (two) times daily as needed for cough.    [provider]  escitalopram  (LEXAPRO ) 10 MG tablet Take 10 mg by mouth daily with breakfast. 04/10/15   [provider]  famotidine  (PEPCID ) 20 MG tablet TAKE ONE TABLET BY MOUTH EVERY DAY AFTER SUPPER 12/25/23   Wert, Michael B, MD  FARXIGA  10 MG TABS tablet Take 10 mg by mouth daily. 03/28/23   [provider]  fexofenadine (ALLEGRA) 180 MG tablet Take 180 mg by mouth in the morning.    [provider]  fluticasone  (FLONASE ) 50 MCG/ACT nasal spray Place 1 spray into both nostrils daily. 09/05/13   Laurann Pollock, MD  gabapentin  (NEURONTIN ) 300 MG capsule Take 1 capsule (300 mg total) by mouth 2 (two) times daily. 01/05/24   Diamond Formica, MD  levothyroxine  (SYNTHROID ) 100 MCG tablet Take 100 mcg by mouth daily before breakfast.    [provider]  losartan  (COZAAR ) 25 MG tablet Take 25 mg by mouth daily. 01/22/24   [provider]  metFORMIN  (GLUCOPHAGE ) 500 MG tablet Take 1 tablet (500 mg total) by mouth 2 (two) times daily with a meal. 08/03/13   Joanne Muckle, PA-C  Multiple  Vitamin (MULITIVITAMIN WITH MINERALS) TABS Take 1 tablet by mouth daily.    [provider]  ondansetron  (ZOFRAN ) 4 MG tablet Take 1 tablet (4 mg total) by mouth every 4 (four) hours as needed for nausea or vomiting. 01/20/24   Teddi Favors, DO  pantoprazole  (PROTONIX ) 40 MG tablet TAKE 1 TABLET 30 TO 60 MINUTES BEFORE FIRST MEAL OF THE DAY. 10/30/23   Diamond Formica, MD  potassium chloride  SA (KLOR-CON  M) 20 MEQ tablet Take 1 tablet (20 mEq total) by mouth daily. 11/14/22 12/29/23  Rai, Hurman Maiden, MD  torsemide  (DEMADEX ) 20 MG tablet Take 1 tablet (20 mg total) by mouth daily. (May take an additional 20mg  as needed for  swelling) 12/04/23   Branch, Joyceann No, MD  warfarin (COUMADIN ) 2.5 MG tablet TAKE AS DIRECTED BY COUMADIN  CLINIC. 11/20/23   Laurann Pollock, MD      Allergies    Amiodarone     Review of Systems   Review of Systems  Constitutional:  Negative for appetite change and fatigue.  HENT:  Negative for congestion, ear discharge and sinus pressure.   Eyes:  Negative for discharge.  Respiratory:  Positive for shortness of breath. Negative for cough.   Cardiovascular:  Negative for chest pain.  Gastrointestinal:  Negative for abdominal pain and diarrhea.  Genitourinary:  Negative for frequency and hematuria.  Musculoskeletal:  Negative for back pain.  Skin:  Negative for rash.  Neurological:  Negative for seizures and headaches.  Psychiatric/Behavioral:  Negative for hallucinations.     Physical Exam Updated Vital Signs BP 134/89   Pulse 91   Temp 97.6 F (36.4 C) (Oral)   Resp 19   Ht 5\' 2"  (1.575 m)   Wt 73.5 kg   SpO2 93%   BMI 29.63 kg/m  Physical Exam Vitals and nursing note reviewed.  Constitutional:      Appearance: She is well-developed.  HENT:     Head: Normocephalic.     Nose: Nose normal.  Eyes:     General: No scleral icterus.    Conjunctiva/sclera: Conjunctivae normal.  Neck:     Thyroid : No thyromegaly.  Cardiovascular:     Rate and Rhythm: Normal rate and regular rhythm.     Heart sounds: No murmur heard.    No friction rub. No gallop.  Pulmonary:     Breath sounds: No stridor. Wheezing present. No rales.     Comments: Minimal wheezing Chest:     Chest wall: No tenderness.  Abdominal:     General: There is no distension.     Tenderness: There is no abdominal tenderness. There is no rebound.  Musculoskeletal:        General: Normal range of motion.     Cervical back: Neck supple.  Lymphadenopathy:     Cervical: No cervical adenopathy.  Skin:    Findings: No erythema or rash.  Neurological:     Mental Status: She is oriented to person, place, and  time.     Motor: No abnormal muscle tone.     Coordination: Coordination normal.  Psychiatric:        Behavior: Behavior normal.     ED Results / Procedures / Treatments   Labs (all labs ordered are listed, but only abnormal results are displayed) Labs Reviewed  BASIC METABOLIC PANEL WITH GFR - Abnormal; Notable for the following components:      Result Value   Glucose, Bld 109 (*)    Creatinine, Ser 1.66 (*)  GFR, Estimated 31 (*)    All other components within normal limits  CBC - Abnormal; Notable for the following components:   Hemoglobin 11.8 (*)    MCH 25.5 (*)    MCHC 29.6 (*)    RDW 17.0 (*)    All other components within normal limits  BRAIN NATRIURETIC PEPTIDE - Abnormal; Notable for the following components:   B Natriuretic Peptide 775.0 (*)    All other components within normal limits  TROPONIN I (HIGH SENSITIVITY) - Abnormal; Notable for the following components:   Troponin I (High Sensitivity) 18 (*)    All other components within normal limits  HEPATIC FUNCTION PANEL  TROPONIN I (HIGH SENSITIVITY)    EKG None  Radiology CT Angio Chest PE W and/or Wo Contrast Result Date: 01/25/2024 CLINICAL DATA:  Chest pain and shortness of breath. EXAM: CT ANGIOGRAPHY CHEST WITH CONTRAST TECHNIQUE: Multidetector CT imaging of the chest was performed using the standard protocol during bolus administration of intravenous contrast. Multiplanar CT image reconstructions and MIPs were obtained to evaluate the vascular anatomy. RADIATION DOSE REDUCTION: This exam was performed according to the departmental dose-optimization program which includes automated exposure control, adjustment of the mA and/or kV according to patient size and/or use of iterative reconstruction technique. CONTRAST:  64mL OMNIPAQUE  IOHEXOL  350 MG/ML SOLN COMPARISON:  03/31/2022 FINDINGS: Cardiovascular: The heart is enlarged but appears relatively stable. Four chamber enlargement. No pericardial effusion.  Tortuosity and calcification thoracic aorta but no aneurysm or dissection. Surgical changes from aortic and mitral valve replacements. The pulmonary arterial tree is well opacified. No filling defects to suggest pulmonary embolism. Mediastinum/Nodes: Stable borderline enlarged mediastinal and hilar lymph nodes likely reactive given the underlying lung disease. No new or progressive findings. The esophagus is grossly normal. Lungs/Pleura: Emphysematous changes and pulmonary scarring. Centrilobular micro nodularity suggesting respiratory bronchiolitis. No pulmonary edema or focal pulmonary infiltrates. Small loculated right pleural effusion is much smaller. Stable radiation changes involving the anterior aspect of the right lung. Stable dense left-sided pleural calcification possibly related to prior pleurodesis, trauma or infection. Numerous scattered calcified granulomas are noted in both lungs. No worrisome pulmonary lesions. Stable 5 mm left lower lobe pulmonary nodule. Upper Abdomen: No significant upper abdominal findings. Stable vascular disease. Musculoskeletal: No significant bony findings. Review of the MIP images confirms the above findings. IMPRESSION: 1. No CT findings for pulmonary embolism. 2. Stable cardiomegaly. 3. Emphysematous changes and pulmonary scarring. 4. Centrilobular micronodularity suggesting respiratory bronchiolitis. 5. Small loculated right pleural effusion is much smaller. 6. Stable radiation changes involving the anterior aspect of the right lung. 7. Stable dense left-sided pleural calcification possibly related to prior pleurodesis, trauma or infection. 8. Stable 5 mm left lower lobe pulmonary nodule. 9. Aortic atherosclerosis. Aortic Atherosclerosis (ICD10-I70.0) and Emphysema (ICD10-J43.9). Electronically Signed   By: Marrian Siva M.D.   On: 01/25/2024 22:49   DG Chest 2 View Result Date: 01/25/2024 CLINICAL DATA:  Pain after fall.  Shortness of breath. EXAM: CHEST - 2 VIEW  COMPARISON:  Chest radiograph dated 01/19/2024. FINDINGS: Stable cardiomegaly. Prior median sternotomy. Prosthetic cardiac valves. Persistent diffuse interstitial prominence, most pronounced at the lung bases. No pleural effusion or pneumothorax. Degenerative changes of the thoracic spine. No acute osseous abnormality identified. IMPRESSION: Cardiomegaly with persistent diffuse interstitial prominence, most pronounced at the lung bases. This could reflect edema or an infectious/inflammatory process. Electronically Signed   By: Mannie Seek M.D.   On: 01/25/2024 20:03    Procedures Procedures    Medications  Ordered in ED Medications  iohexol  (OMNIPAQUE ) 350 MG/ML injection 64 mL (64 mLs Intravenous Contrast Given 01/25/24 2201)  predniSONE  (DELTASONE ) tablet 60 mg (60 mg Oral Given 01/25/24 2323)    ED Course/ Medical Decision Making/ A&P                                 Medical Decision Making Amount and/or Complexity of Data Reviewed Labs: ordered. Radiology: ordered. ECG/medicine tests: ordered.  Risk Prescription drug management.   Patient with mild worsening of COPD.  She will be started on prednisone  and will follow-up with her pulmonologist        Final Clinical Impression(s) / ED Diagnoses Final diagnoses:  COPD exacerbation (HCC)    Rx / DC Orders ED Discharge Orders          Ordered    predniSONE  (DELTASONE ) 10 MG tablet  Daily        01/25/24 2340              Cheyenne Cotta, MD 01/26/24 1301

## 2024-01-29 ENCOUNTER — Encounter: Payer: Self-pay | Admitting: Cardiology

## 2024-02-02 ENCOUNTER — Ambulatory Visit: Attending: Cardiovascular Disease | Admitting: Cardiovascular Disease

## 2024-02-02 ENCOUNTER — Encounter: Payer: Self-pay | Admitting: Cardiovascular Disease

## 2024-02-02 VITALS — BP 110/60 | HR 88 | Ht 62.0 in | Wt 159.0 lb

## 2024-02-02 DIAGNOSIS — I10 Essential (primary) hypertension: Secondary | ICD-10-CM

## 2024-02-02 DIAGNOSIS — I4892 Unspecified atrial flutter: Secondary | ICD-10-CM | POA: Diagnosis not present

## 2024-02-02 DIAGNOSIS — Z01812 Encounter for preprocedural laboratory examination: Secondary | ICD-10-CM

## 2024-02-02 DIAGNOSIS — I4891 Unspecified atrial fibrillation: Secondary | ICD-10-CM | POA: Diagnosis not present

## 2024-02-02 MED ORDER — AMIODARONE HCL 200 MG PO TABS: 200.0000 mg | ORAL_TABLET | Freq: Two times a day (BID) | ORAL | 0 refills | Status: DC

## 2024-02-02 MED ORDER — AMIODARONE HCL 200 MG PO TABS: 200.0000 mg | ORAL_TABLET | Freq: Every day | ORAL | 6 refills | Status: DC

## 2024-02-02 NOTE — Progress Notes (Signed)
 Electrophysiology Office Note:    Date:  02/02/2024   ID:  [name], DOB 01-12-1946, MRN [id number]  PCP:  Artemisa Bile, MD   [facility] Providers Cardiologist:  Armida Lander, MD Electrophysiologist:  Efraim Grange, MD     Referring MD: Artemisa Bile, MD   History of Present Illness:    [name] is a 78 y.o. female with a hx listed below, significant for atrial fibrillation and flutter, cardioembolic stroke, diabetes, hypertension, rheumatic heart disease status post replacement of both aortic and mitral valves with bioprosthetic devices replacement referred for arrhythmia management.     She is on Coumadin  for prior stroke in the setting of rheumatic heart disease.  She was noted to have a possible left atrial thrombus at that time.  She was admitted in November 22, 2022 episodes of syncope.  She was noted to have atrial flutter with 4-1 block.  Syncopal episodes were preceded by shoulder pain, which was thought to be trigger for vasovagal episodes.  She was also noted to be bradycardic with 4:1 atrial flutter.  Prior to admission she was taking diltiazem  180 mg and metoprolol  37.5.  These medications were held.  Diltiazem  was discontinued on discharge, but she remained on metoprolol  37.5 a ZIO monitor was subsequently placed that showed 100% burden of atrial flutter and an average heart rate of 105 bpm.  She has ongoing fatigue, shortness of breath.  She has a pulse ox monitor that reliably measured heart rates at about 110 bpm since discharge. No chest pain, no recurrence of syncope.  She underwent DC cardioversion in April 2024.  On follow-up with me in July 2024, she was maintaining sinus rhythm.  Her husband reports that she did very well over the summer and was even playing golf with friends.  Beginning in February, she had increased fatigue, exertional intolerance and is much less active.  The symptoms correlated with an increase in her heart rate from about  65 beats minute to 90 bpm.  Subsequent EKGs have shown atrial flutter.        Since our last visit, she underwent cardioversion. She has been maintaining sinus rhythm and doing very well.  EKGs/Labs/Other Studies Reviewed Today:    Echocardiogram:  TTE 11/12/2022   LVEF 55 to 6%.  Mildly dilated left atrium  TTE December 19, 2023 EF 55%.  Grade 2 diastolic dysfunction.  Left atrium mild to moderately dilated.  Right atrium mildly dilated.  Status post mitral valve replacement and aortic valve replacement  Monitors:  Zio Patch 11/2022 14 days Atrial flutter, 100% burden,  HR 55-150, avg 106 No patient-triggered events  Advanced imaging:   EKG:   EKG Interpretation Date/Time:  Friday Feb 02 2024 11:28:09 EDT Ventricular Rate:  95 PR Interval:    QRS Duration:  78 QT Interval:  342 QTC Calculation: 429 R Axis:   124  Text Interpretation: Atrial flutter with variable A-V block Right axis deviation Low voltage QRS When compared with ECG of 25-Jan-2024 21:44, No significant change since last tracing Confirmed by Marlane Silver [phone]) on 02/02/2024 11:51:44 AM    Recent Labs: 12/12/2023: TSH 1.708 12/31/2023: Magnesium  1.9 01/25/2024: ALT 28; B Natriuretic Peptide 775.0; BUN 17; Creatinine, Ser 1.66; Hemoglobin 11.8; Platelets 160; Potassium 4.2; Sodium 135     Physical Exam:    VS:  BP 110/60   Pulse 88   Ht 5\' 2"  (1.575 m)   Wt 159 lb (72.1 kg)   SpO2 98%  BMI 29.08 kg/m     Wt Readings from Last 3 Encounters:  02/02/24 159 lb (72.1 kg)  01/25/24 162 lb (73.5 kg)  01/19/24 163 lb (73.9 kg)     GEN: Well nourished, well developed in no acute distress CARDIAC: regular, tachy rhythm, no murmurs, rubs, gallops RESPIRATORY:  Normal work of breathing MUSCULOSKELETAL: trace edema    ASSESSMENT & PLAN:    Atrial flutter - longstanding persistent, likely atypical - it appears that she has been in atrial flutter for 3 years prior to DCCV 01/04/23 - Her quality of  life improved while in sinus rhythm - She has had increased fatigue correlative with recurrence of atrial flutter - Will attempt maintenance of sinus rhythm with amiodarone  --200 mg twice daily followed by DC cardioversion, then 200 mg daily - I have instructed her to wear sunscreen and DC the amiodarone  if she has recurrence of any kind of rash.  She and her husband assured me that she had not had hives, difficulty breathing, or swelling.  Secondary hypercoagulable state - on warfarin for valvular AF  Syncope - situational syncope, triggered by shoulder pain - orthostatic during recent admission  Hypertension -BP controlled today -continue to monitor        Medication Adjustments/Labs and Tests Ordered: Current medicines are reviewed at length with the patient today.  Concerns regarding medicines are outlined above.  Orders Placed This Encounter  Procedures   EKG 12-Lead   No orders of the defined types were placed in this encounter.    Signed, Efraim Grange, MD  02/02/2024 11:51 AM    [facility]

## 2024-02-02 NOTE — Patient Instructions (Addendum)
 Medication Instructions:   Begin Amiodarone  200mg  twice a day  x 2 weeks, then decrease to 200mg  daily thereafter Continue all other medications.     Labwork:  none  Testing/Procedures:  Your physician has recommended that you have a Cardioversion (DCCV). Electrical Cardioversion uses a jolt of electricity to your heart either through paddles or wired patches attached to your chest. This is a controlled, usually prescheduled, procedure. Defibrillation is done under light anesthesia in the hospital, and you usually go home the day of the procedure. This is done to get your heart back into a normal rhythm. You are not awake for the procedure. Please see the instruction sheet given to you today.   Follow-Up:  6-8 weeks afib clinic   Any Other Special Instructions Will Be Listed Below (If Applicable).  START WEARING SUNSCREEN - IF ANY RASHES OR SKIN PROBLEMS DEVELOP, STOP THE AMIODARONE  AND NOTIFY THE OFFICE   If you need a refill on your cardiac medications before your next appointment, please call your pharmacy.

## 2024-02-04 ENCOUNTER — Encounter: Payer: Self-pay | Admitting: Cardiology

## 2024-02-06 ENCOUNTER — Encounter: Payer: Self-pay | Admitting: *Deleted

## 2024-02-06 ENCOUNTER — Telehealth: Payer: Self-pay | Admitting: Cardiology

## 2024-02-06 NOTE — Telephone Encounter (Signed)
 Checking percert on the following patient for testing scheduled at [facility].   DCCV scheduled for 02/16/2024 at 9:00 am with Branch at [facility].

## 2024-02-09 NOTE — Addendum Note (Signed)
 Addended by: Lasalle Pointer on: 02/09/2024 04:30 PM   Modules accepted: Orders

## 2024-02-09 NOTE — Progress Notes (Addendum)
 Arranging signed and held orders per [facility], LPN's recommendations. She is unable to do this for Dr. Arlester Ladd at this time.   These orders have been placed and are under signed and held.   Lasalle Pointer, NP

## 2024-02-12 ENCOUNTER — Ambulatory Visit: Attending: Cardiology | Admitting: *Deleted

## 2024-02-12 ENCOUNTER — Ambulatory Visit: Admitting: Primary Care

## 2024-02-12 VITALS — BP 110/70 | HR 85 | Ht 62.0 in | Wt 162.4 lb

## 2024-02-12 DIAGNOSIS — Z5181 Encounter for therapeutic drug level monitoring: Secondary | ICD-10-CM | POA: Diagnosis not present

## 2024-02-12 DIAGNOSIS — Z952 Presence of prosthetic heart valve: Secondary | ICD-10-CM | POA: Diagnosis not present

## 2024-02-12 DIAGNOSIS — R053 Chronic cough: Secondary | ICD-10-CM | POA: Diagnosis not present

## 2024-02-12 DIAGNOSIS — J45991 Cough variant asthma: Secondary | ICD-10-CM

## 2024-02-12 DIAGNOSIS — K219 Gastro-esophageal reflux disease without esophagitis: Secondary | ICD-10-CM | POA: Diagnosis not present

## 2024-02-12 DIAGNOSIS — I4891 Unspecified atrial fibrillation: Secondary | ICD-10-CM

## 2024-02-12 LAB — POCT INR: INR: 3.3 — AB (ref 2.0–3.0)

## 2024-02-12 MED ORDER — PROMETHAZINE-DM 6.25-15 MG/5ML PO SYRP: 5.0000 mL | ORAL_SOLUTION | Freq: Four times a day (QID) | ORAL | 0 refills | Status: DC | PRN

## 2024-02-12 MED ORDER — TRELEGY ELLIPTA 100-62.5-25 MCG/ACT IN AEPB: 1.0000 | INHALATION_SPRAY | Freq: Every day | RESPIRATORY_TRACT | Status: DC

## 2024-02-12 MED ORDER — AZELASTINE HCL 0.1 % NA SOLN: 1.0000 | Freq: Two times a day (BID) | NASAL | 1 refills | Status: DC

## 2024-02-12 NOTE — Patient Instructions (Signed)
 Pending DCCV 02/16/24 Continue warfarin 1 tablet daily except 1 1/2 tablets on Sundays and Thursdays Pt is holding off on colonoscopy procedure for now Amiodarone  250mg  will decrease to 1 tablet daily on 02/15/24 Recheck INR in 1 week

## 2024-02-12 NOTE — Progress Notes (Signed)
 @Patient  ID: [name], female    DOB: 1945/12/29, 78 y.o.   MRN: [id number]  Chief Complaint  Patient presents with   Sleep Apnea    Referring provider: Artemisa Bile, MD  HPI: 78 year old female, never smoked. PMH significant for afib, HTN, severe mitral valve stenosis, CHF, cough variant asthma, obstructive lung disease, GERD, type 2 diabetes, hyperlipidemia.    02/12/2024 - Interim hx  Discussed the use of AI scribe software for clinical note transcription with the patient, who gave verbal consent to proceed.  History of Present Illness   [name] is a 78 year old female with chronic cough and asthma who presents for follow-up of her respiratory symptoms.   She has experienced a chronic cough since 2010, which is productive with mucus and particularly worse in the morning and around bedtime. Various treatments have been tried, including gabapentin , which was discontinued due to side effects and lack of efficacy, and nasal rinses without significant improvement. In February, a nasal endoscopy and a CT scan of the head revealed no intracranial abnormalities, clear paranasal sinuses, and no masses or abnormalities on the scope. Postnasal drainage was noted, with findings consistent with GERD or laryngopharyngeal reflux.  She is currently taking Allegra and Flonase  for allergies, and Protonix  for GERD. She has a history of COPD and uses Symbicort  and albuterol  as needed, with albuterol  providing relief during acute episodes. She has never smoked and is unsure of the origin of her COPD diagnosis. She experiences shortness of breath and wheezing, but denies any history of smoking. She has experienced some leg swelling. She has not been on Trelegy before and has only used Symbicort  and albuterol .  She has a history of open heart surgery and is currently managing atrial fibrillation, with a cardioversion scheduled for later this week. Her cardiologist and general practitioner have been  involved in managing her medications due to concerns about kidney function, with a creatinine level of 1.6. She is also on a blood thinner, Coumadin .      Allergies  Allergen Reactions   Amiodarone  Rash    Immunization History  Administered Date(s) Administered   Fluad Quad(high Dose 65+) 07/02/2022   Influenza Split 07/30/2012   Influenza, High Dose Seasonal PF 08/10/2018, 07/09/2020, 06/11/2021   PFIZER(Purple Top)SARS-COV-2 Vaccination 10/12/2019, 11/02/2019, 06/30/2020   Pfizer Covid-19 Vaccine Bivalent Booster 21yrs & up 06/03/2021   Pneumococcal Conjugate-13 10/04/2003   Zoster Recombinant(Shingrix) 12/10/2018, 07/02/2019    Past Medical History:  Diagnosis Date   Anemia    Asthma    Atrial fibrillation (HCC)    Breast carcinoma (HCC) 1995   1995   Cardioembolic stroke (HCC) 01/2012   Right frontal in 01/2012; normal carotid ultrasound; possible LAA thrombus by TEE; virtual complete neurologic recovery   Chronic kidney disease, stage 2, mildly decreased GFR    GFR of approximately 60   Depression    Diabetes mellitus without complication (HCC)    Diverticulosis of colon (without mention of hemorrhage) 2012   Dr. Homero Luster   Fasting hyperglycemia    120 fasting   Gastroesophageal reflux disease    Gastroparesis    Hemorrhoids    Hyperlipidemia    Hypertension    pt denies 05/30/13     Dr Roann Chestnut   Hyponatremia    Rheumatic heart disease    a. 07/2013 Echo: Ef 55-60%, Mod AS/AI, Mod-Sev MS, sev dil LA, PASP 58;  b. 07/2013 TEE: EF 35-40%, mild-mod AS/AI, mod-sev MS,  LA smoke;  c. 07/2013 Cath: elev R heart pressures, nl cors.   Shortness of breath     Tobacco History: Social History   Tobacco Use  Smoking Status Never  Smokeless Tobacco Never   Counseling given: Not Answered   Outpatient Medications Prior to Visit  Medication Sig Dispense Refill   acetaminophen  (TYLENOL ) 325 MG tablet Take 2 tablets (650 mg total) by mouth every 6 (six) hours as  needed. 36 tablet 0   albuterol  (VENTOLIN  HFA) 108 (90 Base) MCG/ACT inhaler Inhale 2 puffs into the lungs every 6 (six) hours as needed for wheezing or shortness of breath.     amiodarone  (PACERONE ) 200 MG tablet Take 1 tablet (200 mg total) by mouth 2 (two) times daily for 14 days. 28 tablet 0   amiodarone  (PACERONE ) 200 MG tablet Take 1 tablet (200 mg total) by mouth daily. 30 tablet 6   atorvastatin  (LIPITOR) 40 MG tablet TAKE ONE TABLET BY MOUTH ONCE DAILY. 90 tablet 3   bisoprolol  (ZEBETA ) 10 MG tablet Take 1 tablet (10 mg total) by mouth daily. + 5mg  tab to equal new 15mg  dose.     bisoprolol  (ZEBETA ) 5 MG tablet Take 1 tablet (5 mg total) by mouth daily. + 10mg  tab to equal new 15mg  dose.     budesonide -formoterol  (SYMBICORT ) 80-4.5 MCG/ACT inhaler Take 2 puffs first thing in am and then another 2 puffs about 12 hours later. 1 each 12   dextromethorphan -guaiFENesin  (MUCINEX  DM) 30-600 MG 12hr tablet Take 1 tablet by mouth 2 (two) times daily as needed for cough.     escitalopram  (LEXAPRO ) 10 MG tablet Take 10 mg by mouth daily with breakfast.     famotidine  (PEPCID ) 20 MG tablet TAKE ONE TABLET BY MOUTH EVERY DAY AFTER SUPPER 30 tablet 0   FARXIGA  10 MG TABS tablet Take 10 mg by mouth daily.     fexofenadine (ALLEGRA) 180 MG tablet Take 180 mg by mouth in the morning.     fluticasone  (FLONASE ) 50 MCG/ACT nasal spray Place 1 spray into both nostrils daily.     levothyroxine  (SYNTHROID ) 100 MCG tablet Take 100 mcg by mouth daily before breakfast.     losartan  (COZAAR ) 25 MG tablet Take 25 mg by mouth daily.     metFORMIN  (GLUCOPHAGE ) 500 MG tablet Take 1 tablet (500 mg total) by mouth 2 (two) times daily with a meal. 60 tablet 1   Multiple Vitamin (MULITIVITAMIN WITH MINERALS) TABS Take 1 tablet by mouth daily.     ondansetron  (ZOFRAN ) 4 MG tablet Take 1 tablet (4 mg total) by mouth every 4 (four) hours as needed for nausea or vomiting. 6 tablet 0   pantoprazole  (PROTONIX ) 40 MG tablet TAKE  1 TABLET 30 TO 60 MINUTES BEFORE FIRST MEAL OF THE DAY. 90 tablet 1   Polyethyl Glycol-Propyl Glycol (SYSTANE OP) Place 1 drop into both eyes daily as needed (dry eyes).     torsemide  (DEMADEX ) 20 MG tablet Take 1 tablet (20 mg total) by mouth daily. (May take an additional 20mg  as needed for swelling) 45 tablet 6   warfarin (COUMADIN ) 2.5 MG tablet TAKE AS DIRECTED BY COUMADIN  CLINIC. (Patient taking differently: Take 2.5 mg by mouth daily. TAKE AS DIRECTED BY COUMADIN  CLINIC.) 40 tablet 3   gabapentin  (NEURONTIN ) 300 MG capsule Take 1 capsule (300 mg total) by mouth 2 (two) times daily. (Patient not taking: Reported on 02/12/2024) 180 capsule 3   potassium chloride  SA (KLOR-CON  M) 20 MEQ tablet  Take 1 tablet (20 mEq total) by mouth daily. 30 tablet 0   No facility-administered medications prior to visit.    Review of Systems  Review of Systems  Constitutional: Negative.   HENT: Negative.    Respiratory:  Positive for cough and shortness of breath.   Cardiovascular: Negative.    Physical Exam  BP 110/70 (BP Location: Left Arm)   Pulse 85   Ht 5\' 2"  (1.575 m)   Wt 162 lb 6.4 oz (73.7 kg)   SpO2 99% Comment: ra  BMI 29.70 kg/m  Physical Exam Constitutional:      Appearance: Normal appearance.  HENT:     Head: Normocephalic and atraumatic.     Mouth/Throat:     Mouth: Mucous membranes are moist.     Pharynx: Oropharynx is clear.  Cardiovascular:     Rate and Rhythm: Normal rate and regular rhythm.     Comments: Trace LE edema Pulmonary:     Effort: Pulmonary effort is normal. No respiratory distress.     Breath sounds: No stridor. Wheezing present. No rhonchi.  Skin:    General: Skin is warm and dry.  Neurological:     General: No focal deficit present.     Mental Status: She is alert and oriented to person, place, and time. Mental status is at baseline.  Psychiatric:        Mood and Affect: Mood normal.        Behavior: Behavior normal.        Thought Content: Thought  content normal.        Judgment: Judgment normal.      Lab Results:  CBC    Component Value Date/Time   WBC 5.8 01/25/2024 2031   RBC 4.62 01/25/2024 2031   HGB 11.8 (L) 01/25/2024 2031   HGB 13.7 01/02/2023 0748   HCT 39.8 01/25/2024 2031   HCT 40.6 01/02/2023 0748   PLT 160 01/25/2024 2031   PLT 198 01/02/2023 0748   MCV 86.1 01/25/2024 2031   MCV 92 01/02/2023 0748   MCH 25.5 (L) 01/25/2024 2031   MCHC 29.6 (L) 01/25/2024 2031   RDW 17.0 (H) 01/25/2024 2031   RDW 14.3 01/02/2023 0748   LYMPHSABS 1.0 12/31/2023 0958   MONOABS 0.4 12/31/2023 0958   EOSABS 0.1 12/31/2023 0958   BASOSABS 0.1 12/31/2023 0958    BMET    Component Value Date/Time   NA 135 01/25/2024 2031   NA 136 01/02/2023 0748   K 4.2 01/25/2024 2031   CL 102 01/25/2024 2031   CO2 24 01/25/2024 2031   GLUCOSE 109 (H) 01/25/2024 2031   BUN 17 01/25/2024 2031   BUN 15 01/02/2023 0748   CREATININE 1.66 (H) 01/25/2024 2031   CREATININE 0.89 08/08/2013 0924   CALCIUM  9.7 01/25/2024 2031   GFRNONAA 31 (L) 01/25/2024 2031   GFRAA 51 (L) 05/01/2020 0548    BNP    Component Value Date/Time   BNP 775.0 (H) 01/25/2024 2031    ProBNP    Component Value Date/Time   PROBNP 459.2 (H) 07/08/2013 0550    Imaging: CT Angio Chest PE W and/or Wo Contrast Result Date: 01/25/2024 CLINICAL DATA:  Chest pain and shortness of breath. EXAM: CT ANGIOGRAPHY CHEST WITH CONTRAST TECHNIQUE: Multidetector CT imaging of the chest was performed using the standard protocol during bolus administration of intravenous contrast. Multiplanar CT image reconstructions and MIPs were obtained to evaluate the vascular anatomy. RADIATION DOSE REDUCTION: This exam was performed according to  the departmental dose-optimization program which includes automated exposure control, adjustment of the mA and/or kV according to patient size and/or use of iterative reconstruction technique. CONTRAST:  64mL OMNIPAQUE  IOHEXOL  350 MG/ML SOLN  COMPARISON:  03/31/2022 FINDINGS: Cardiovascular: The heart is enlarged but appears relatively stable. Four chamber enlargement. No pericardial effusion. Tortuosity and calcification thoracic aorta but no aneurysm or dissection. Surgical changes from aortic and mitral valve replacements. The pulmonary arterial tree is well opacified. No filling defects to suggest pulmonary embolism. Mediastinum/Nodes: Stable borderline enlarged mediastinal and hilar lymph nodes likely reactive given the underlying lung disease. No new or progressive findings. The esophagus is grossly normal. Lungs/Pleura: Emphysematous changes and pulmonary scarring. Centrilobular micro nodularity suggesting respiratory bronchiolitis. No pulmonary edema or focal pulmonary infiltrates. Small loculated right pleural effusion is much smaller. Stable radiation changes involving the anterior aspect of the right lung. Stable dense left-sided pleural calcification possibly related to prior pleurodesis, trauma or infection. Numerous scattered calcified granulomas are noted in both lungs. No worrisome pulmonary lesions. Stable 5 mm left lower lobe pulmonary nodule. Upper Abdomen: No significant upper abdominal findings. Stable vascular disease. Musculoskeletal: No significant bony findings. Review of the MIP images confirms the above findings. IMPRESSION: 1. No CT findings for pulmonary embolism. 2. Stable cardiomegaly. 3. Emphysematous changes and pulmonary scarring. 4. Centrilobular micronodularity suggesting respiratory bronchiolitis. 5. Small loculated right pleural effusion is much smaller. 6. Stable radiation changes involving the anterior aspect of the right lung. 7. Stable dense left-sided pleural calcification possibly related to prior pleurodesis, trauma or infection. 8. Stable 5 mm left lower lobe pulmonary nodule. 9. Aortic atherosclerosis. Aortic Atherosclerosis (ICD10-I70.0) and Emphysema (ICD10-J43.9). Electronically Signed   By: Marrian Siva  M.D.   On: 01/25/2024 22:49   DG Chest 2 View Result Date: 01/25/2024 CLINICAL DATA:  Pain after fall.  Shortness of breath. EXAM: CHEST - 2 VIEW COMPARISON:  Chest radiograph dated 01/19/2024. FINDINGS: Stable cardiomegaly. Prior median sternotomy. Prosthetic cardiac valves. Persistent diffuse interstitial prominence, most pronounced at the lung bases. No pleural effusion or pneumothorax. Degenerative changes of the thoracic spine. No acute osseous abnormality identified. IMPRESSION: Cardiomegaly with persistent diffuse interstitial prominence, most pronounced at the lung bases. This could reflect edema or an infectious/inflammatory process. Electronically Signed   By: Mannie Seek M.D.   On: 01/25/2024 20:03   DG Chest Portable 1 View Result Date: 01/19/2024 CLINICAL DATA:  Syncope EXAM: PORTABLE CHEST 1 VIEW COMPARISON:  12/31/2023, 11/11/2022 FINDINGS: Post sternotomy changes and valve prosthesis. Cardiomegaly. No focal airspace disease, pleural effusion or pneumothorax. Mild diffuse interstitial opacity. IMPRESSION: Cardiomegaly with mild diffuse interstitial opacity which may be due to edema or mild interstitial inflammatory process. Electronically Signed   By: Esmeralda Hedge M.D.   On: 01/19/2024 23:21   CT HEAD WO CONTRAST Result Date: 01/19/2024 CLINICAL DATA:  Head trauma, loss of consciousness in kitchen hit head on floor. EXAM: CT HEAD WITHOUT CONTRAST CT CERVICAL SPINE WITHOUT CONTRAST TECHNIQUE: Multidetector CT imaging of the head and cervical spine was performed following the standard protocol without intravenous contrast. Multiplanar CT image reconstructions of the cervical spine were also generated. RADIATION DOSE REDUCTION: This exam was performed according to the departmental dose-optimization program which includes automated exposure control, adjustment of the mA and/or kV according to patient size and/or use of iterative reconstruction technique. COMPARISON:  11/11/2022. FINDINGS:  CT HEAD FINDINGS Brain: No acute intracranial hemorrhage. No CT evidence of acute infarct. Nonspecific hypoattenuation in the periventricular and subcortical white matter favored to  reflect chronic microvascular ischemic changes. Similar encephalomalacia in the parasagittal right frontal lobe. Mild parenchymal volume loss. No edema, mass effect, or midline shift. The basilar cisterns are patent. Ventricles: Prominence of the ventricles suggesting underlying parenchymal volume loss. Small remote infarct in the left cerebellum. Vascular: Atherosclerotic calcifications of the carotid siphons. No hyperdense vessel. Skull: No acute or aggressive finding. Orbits: Orbits are symmetric. Sinuses: The visualized paranasal sinuses are clear. Other: Mastoid air cells are clear. CT CERVICAL SPINE FINDINGS Alignment: Trace anterolisthesis of C4 on C5 again noted. Alignment otherwise maintained. No facet subluxation or dislocation. Skull base and vertebrae: No acute fracture. No primary bone lesion or focal pathologic process. Similar chronic deformity of the T2 superior endplate with mild height loss. Soft tissues and spinal canal: No prevertebral fluid or swelling. No visible canal hematoma. Disc levels: Intervertebral disc space narrowing most pronounced at C6-7. No high-grade osseous spinal canal stenosis. Facet arthrosis at multiple levels. Foraminal narrowing is most pronounced on the left at C5-6 and bilaterally at C6-7. Upper chest: Biapical pleuroparenchymal scarring and pleural calcifications. Other: None. IMPRESSION: No CT evidence of acute intracranial abnormality. No acute fracture or traumatic malalignment of the cervical spine. Similar encephalomalacia in the parasagittal right frontal lobe, likely sequelae of prior infarct or trauma. Chronic and degenerative changes as above. Electronically Signed   By: Denny Flack M.D.   On: 01/19/2024 20:19   CT Cervical Spine Wo Contrast Result Date: 01/19/2024 CLINICAL  DATA:  Head trauma, loss of consciousness in kitchen hit head on floor. EXAM: CT HEAD WITHOUT CONTRAST CT CERVICAL SPINE WITHOUT CONTRAST TECHNIQUE: Multidetector CT imaging of the head and cervical spine was performed following the standard protocol without intravenous contrast. Multiplanar CT image reconstructions of the cervical spine were also generated. RADIATION DOSE REDUCTION: This exam was performed according to the departmental dose-optimization program which includes automated exposure control, adjustment of the mA and/or kV according to patient size and/or use of iterative reconstruction technique. COMPARISON:  11/11/2022. FINDINGS: CT HEAD FINDINGS Brain: No acute intracranial hemorrhage. No CT evidence of acute infarct. Nonspecific hypoattenuation in the periventricular and subcortical white matter favored to reflect chronic microvascular ischemic changes. Similar encephalomalacia in the parasagittal right frontal lobe. Mild parenchymal volume loss. No edema, mass effect, or midline shift. The basilar cisterns are patent. Ventricles: Prominence of the ventricles suggesting underlying parenchymal volume loss. Small remote infarct in the left cerebellum. Vascular: Atherosclerotic calcifications of the carotid siphons. No hyperdense vessel. Skull: No acute or aggressive finding. Orbits: Orbits are symmetric. Sinuses: The visualized paranasal sinuses are clear. Other: Mastoid air cells are clear. CT CERVICAL SPINE FINDINGS Alignment: Trace anterolisthesis of C4 on C5 again noted. Alignment otherwise maintained. No facet subluxation or dislocation. Skull base and vertebrae: No acute fracture. No primary bone lesion or focal pathologic process. Similar chronic deformity of the T2 superior endplate with mild height loss. Soft tissues and spinal canal: No prevertebral fluid or swelling. No visible canal hematoma. Disc levels: Intervertebral disc space narrowing most pronounced at C6-7. No high-grade osseous  spinal canal stenosis. Facet arthrosis at multiple levels. Foraminal narrowing is most pronounced on the left at C5-6 and bilaterally at C6-7. Upper chest: Biapical pleuroparenchymal scarring and pleural calcifications. Other: None. IMPRESSION: No CT evidence of acute intracranial abnormality. No acute fracture or traumatic malalignment of the cervical spine. Similar encephalomalacia in the parasagittal right frontal lobe, likely sequelae of prior infarct or trauma. Chronic and degenerative changes as above. Electronically Signed   By: Zoila Hines  Hunt M.D.   On: 01/19/2024 20:19     Assessment & Plan:   1. Asthma, cough variant (Primary)  2. Gastroesophageal reflux disease, unspecified whether esophagitis present   Assessment and Plan    Asthma Asthma with wheezing and moderate obstructive lung disease from a 2014 pulmonary function test. Persistent symptoms despite current use of Symbicort  inhaler. Wheezing present during examination. - Prescribe Trelegy 100cmg inhaler to replace Symbicort , to be initiated post-cardioversion. - Provide sample of Trelegy if available. - Continue albuterol  for emergency use.  Chronic cough Chronic cough with mucus production. Previous treatments, including gabapentin , were ineffective. ENT evaluation indicated postnasal drip and findings consistent with GERD or laryngopharyngeal reflux. Gabapentin  was discontinued due to side effects and lack of efficacy. Promethazine DM cough syrup considered for its antihistamine and cough suppressant properties. - Prescribe promethazine DM cough syrup. - Continue Allegra and Flonase . - Prescribe Astelin nasal spray to use with Flonase . - Perform allergy testing today.  GERD GERD with symptoms of postnasal drip and globus sensation. ENT evaluation consistent with GERD or laryngopharyngeal reflux. Astelin nasal spray considered to alleviate postnasal drip. - Continue Protonix .  Atrial fibrillation Atrial fibrillation  with current symptoms of dyspnea. Scheduled for cardioversion on Friday to restore normal rhythm. Dyspnea may be related to atrial fibrillation. - Proceed with cardioversion on Friday. - Delay starting Trelegy inhaler until post-cardioversion.  Recording duration: 14 minutes      Antonio Baumgarten, NP 02/12/2024

## 2024-02-12 NOTE — Patient Instructions (Signed)
-  ASTHMA/COPD: Asthma is a condition where your airways narrow and swell, causing difficulty in breathing. We will switch your current Symbicort  inhaler to a Trelegy inhaler after your cardioversion. Continue using albuterol  for emergency relief. If new inhaler helps please call and we will send in prescription, if no better resume Symbicort    -CHRONIC COUGH: Chronic cough is a cough that lasts for a long time and produces mucus. We will start you on promethazine DM cough syrup and continue your Allegra and Flonase . We are also adding Astelin nasal spray to use with Flonase . Allergy testing will be performed today.  -GERD: GERD is a digestive disorder where stomach acid frequently flows back into the tube connecting your mouth and stomach. Continue taking Protonix    -ATRIAL FIBRILLATION: Atrial fibrillation is an irregular and often rapid heart rate that can lead to poor blood flow. You are scheduled for a cardioversion on Friday to restore your normal heart rhythm. We will start the Trelegy inhaler after this procedure.  INSTRUCTIONS: Please proceed with your scheduled cardioversion on Friday. We will start the Trelegy inhaler after this procedure. Allergy testing will be performed today.  Follow-up 2-3 months with [facility] NP or sooner if needed

## 2024-02-14 ENCOUNTER — Telehealth: Payer: Self-pay

## 2024-02-14 ENCOUNTER — Encounter

## 2024-02-14 ENCOUNTER — Other Ambulatory Visit (HOSPITAL_COMMUNITY)
Admission: RE | Admit: 2024-02-14 | Discharge: 2024-02-14 | Disposition: A | Discharge status: Home / Self Care | Source: Ambulatory Visit | Attending: Cardiovascular Disease | Admitting: Cardiovascular Disease

## 2024-02-14 ENCOUNTER — Encounter (HOSPITAL_COMMUNITY)
Admission: RE | Admit: 2024-02-14 | Discharge: 2024-02-14 | Disposition: A | Discharge status: Home / Self Care | Source: Ambulatory Visit | Attending: Cardiology | Admitting: Cardiology

## 2024-02-14 ENCOUNTER — Encounter (HOSPITAL_COMMUNITY): Payer: Self-pay

## 2024-02-14 VITALS — BP 105/57 | HR 60 | Temp 98.0°F | Resp 18 | Ht 62.0 in | Wt 158.7 lb

## 2024-02-14 DIAGNOSIS — Z01812 Encounter for preprocedural laboratory examination: Secondary | ICD-10-CM

## 2024-02-14 DIAGNOSIS — I10 Essential (primary) hypertension: Secondary | ICD-10-CM | POA: Diagnosis not present

## 2024-02-14 DIAGNOSIS — I484 Atypical atrial flutter: Secondary | ICD-10-CM | POA: Diagnosis not present

## 2024-02-14 HISTORY — DX: Hypothyroidism, unspecified: E03.9

## 2024-02-14 HISTORY — DX: Cardiac arrhythmia, unspecified: I49.9

## 2024-02-14 LAB — BASIC METABOLIC PANEL WITH GFR
Anion gap: 11 (ref 5–15)
BUN: 16 mg/dL (ref 8–23)
CO2: 24 mmol/L (ref 22–32)
Calcium: 8.5 mg/dL — ABNORMAL LOW (ref 8.9–10.3)
Chloride: 103 mmol/L (ref 98–111)
Creatinine, Ser: 1.54 mg/dL — ABNORMAL HIGH (ref 0.44–1.00)
GFR, Estimated: 34 mL/min — ABNORMAL LOW (ref >60)
Glucose, Bld: 142 mg/dL — ABNORMAL HIGH (ref 70–99)
Potassium: 3.8 mmol/L (ref 3.5–5.1)
Sodium: 138 mmol/L (ref 135–145)

## 2024-02-14 LAB — CBC
HCT: 39.2 % (ref 36.0–46.0)
Hemoglobin: 12.1 g/dL (ref 12.0–15.0)
MCH: 26 pg (ref 26.0–34.0)
MCHC: 30.9 g/dL (ref 30.0–36.0)
MCV: 84.3 fL (ref 80.0–100.0)
Platelets: 166 10*3/uL (ref 150–400)
RBC: 4.65 MIL/uL (ref 3.87–5.11)
RDW: 18.6 % — ABNORMAL HIGH (ref 11.5–15.5)
WBC: 7.2 10*3/uL (ref 4.0–10.5)
nRBC: 0 % (ref 0.0–0.2)

## 2024-02-14 NOTE — Patient Instructions (Signed)
 [name]  02/14/2024     @PREFPERIOPPHARMACY @   Your procedure is scheduled on  02/16/2024.   Report to [facility] at  0700  A.M.   Call this number if you have problems the morning of surgery:  [phone]  If you experience any cold or flu symptoms such as cough, fever, chills, shortness of breath, etc. between now and your scheduled surgery, please notify us  at the above number.   Remember:         DO NOT miss any doses of warfarin before your procedure.        Your last dose of farxiga  soul have been on 02/12/2024.        DO NOT take any medications for diabetes the morning of your procedure.   Do not eat after midnight.   You may drink clear liquids until  0500 am on 02/16/2024.    Clear liquids allowed are:                    Water, Juice (No red color; non-citric and without pulp; diabetics please choose diet or no sugar options), Carbonated beverages (diabetics please choose diet or no sugar options), Clear Tea (No creamer, milk, or cream, including half & half and powdered creamer), Black Coffee Only (No creamer, milk or cream, including half & half and powdered creamer), and Clear Sports drink (No red color; diabetics please choose diet or no sugar options)    Take these medicines the morning of surgery with A SIP OF WATER                  escitalopram , pepcid , allegra, levothyroxine , protonix .    Do not wear jewelry, make-up or nail polish, including gel polish,  artificial nails, or any other type of covering on natural nails (fingers and  toes).  Do not wear lotions, powders, or perfumes, or deodorant.  Do not shave 48 hours prior to surgery.  Men may shave face and neck.  Do not bring valuables to the hospital.  [facility] is not responsible for any belongings or valuables.  Contacts, dentures or bridgework may not be worn into surgery.  Leave your suitcase in the car.  After surgery it may be brought to your room.  For patients admitted to  the hospital, discharge time will be determined by your treatment team.  Patients discharged the day of surgery will not be allowed to drive home and must have someone with them for 24 hours.    Special instructions:  DO NOT smoke tobacco or vape for 24 hours before your procedure.  Please read over the following fact sheets that you were given. Anesthesia Post-op Instructions and Care and Recovery After Surgery      Electrical Cardioversion Electrical cardioversion is the delivery of a jolt of electricity to restore a normal rhythm to the heart. A rhythm that is too fast or is not regular (arrhythmia) keeps the heart from pumping blood well. There is also another type of cardioversion called a chemical (pharmacologic) cardioversion. This is when your health care provider gives you one or more medicines to bring back your regular heart rhythm. Electrical cardioversion is done as a scheduled procedure for arrhythmiasthat are not life-threatening. Electrical cardioversion may also be done in an emergency for sudden life-threatening arrhythmias. Tell a health care provider about: Any allergies you have. All medicines you are taking, including vitamins, herbs, eye drops, creams, and over-the-counter medicines.  Any problems you or family members have had with sedatives or anesthesia. Any bleeding problems you have. Any surgeries you have had, including a pacemaker, defibrillator, or other implanted device. Any medical conditions you have. Whether you are pregnant or may be pregnant. What are the risks? Your provider will talk with you about risks. These include: Allergic reactions to medicines. Irritation to the skin on your chest or back where the sticky pads (electrodes) or paddles were put during electrical cardioversion. A blood clot that breaks free and travels to other parts of your body, such as your brain. Return of a worse abnormal heart rhythm that will need to be treated with  medicines, a pacemaker, or an implantable cardioverter defibrillator (ICD). What happens before the procedure? Medicines Your provider may give you: Blood-thinning medicines (anticoagulants) so your blood does not clot as easily. If your provider gives you this medicine, you may need to take it for 4 weeks before the procedure. Medicines to help stabilize your heart rate and rhythm. Ask your provider about: Changing or stopping your regular medicines. These include any diabetes medicines or blood thinners you take. Taking medicines such as aspirin  and ibuprofen. These medicines can thin your blood. Do not take them unless your provider tells you to. Taking over-the-counter medicines, vitamins, herbs, and supplements. General instructions Follow instructions from your provider about what you may eat and drink. Do not put any lotions, powders, or ointments on your chest and back for 24 hours before the procedure. They can cause problems with the electrodes or paddles used to deliver electricity to your heart. Do not wear jewelry as this can interfere with delivering electricity to your heart. If you will be going home right after the procedure, plan to have a responsible adult: Take you home from the hospital or clinic. You will not be allowed to drive. Care for you for the time you are told. Tests You may have an exam or testing. This may include: Blood labs. A transesophageal echocardiogram (TEE). What happens during the procedure?     An IV will be inserted into one of your veins. You will be given a sedative. This helps you relax. Electrodes or metal paddles will be placed on your chest. They may be placed in one of these ways: One placed on your right chest, the other on the left ribs. One placed on your chest and the other on your back. An electrical shock will be delivered. The shock briefly stops (resets) your heart rhythm. Your provider will check to see if your heart rhythm is  now normal. Some people need only one shock. Some need more to restore a normal heart rhythm. The procedure may vary among providers and hospitals. What happens after the procedure? Your blood pressure, heart rate, breathing rate, and blood oxygen  level will be monitored until you leave the hospital or clinic. Your heart rhythm will be watched to make sure it does not change. This information is not intended to replace advice given to you by your health care provider. Make sure you discuss any questions you have with your health care provider. Document Revised: 05/12/2022 Document Reviewed: 05/12/2022 Elsevier Patient Education  2024 [employer].General Anesthesia, Adult, Care After The following information offers guidance on how to care for yourself after your procedure. Your health care provider may also give you more specific instructions. If you have problems or questions, contact your health care provider. What can I expect after the procedure? After the procedure, it is common  for people to: Have pain or discomfort at the IV site. Have nausea or vomiting. Have a sore throat or hoarseness. Have trouble concentrating. Feel cold or chills. Feel weak, sleepy, or tired (fatigue). Have soreness and body aches. These can affect parts of the body that were not involved in surgery. Follow these instructions at home: For the time period you were told by your health care provider:  Rest. Do not participate in activities where you could fall or become injured. Do not drive or use machinery. Do not drink alcohol. Do not take sleeping pills or medicines that cause drowsiness. Do not make important decisions or sign legal documents. Do not take care of children on your own. General instructions Drink enough fluid to keep your urine pale yellow. If you have sleep apnea, surgery and certain medicines can increase your risk for breathing problems. Follow instructions from your health care  provider about wearing your sleep device: Anytime you are sleeping, including during daytime naps. While taking prescription pain medicines, sleeping medicines, or medicines that make you drowsy. Return to your normal activities as told by your health care provider. Ask your health care provider what activities are safe for you. Take over-the-counter and prescription medicines only as told by your health care provider. Do not use any products that contain nicotine or tobacco. These products include cigarettes, chewing tobacco, and vaping devices, such as e-cigarettes. These can delay incision healing after surgery. If you need help quitting, ask your health care provider. Contact a health care provider if: You have nausea or vomiting that does not get better with medicine. You vomit every time you eat or drink. You have pain that does not get better with medicine. You cannot urinate or have bloody urine. You develop a skin rash. You have a fever. Get help right away if: You have trouble breathing. You have chest pain. You vomit blood. These symptoms may be an emergency. Get help right away. Call 911. Do not wait to see if the symptoms will go away. Do not drive yourself to the hospital. Summary After the procedure, it is common to have a sore throat, hoarseness, nausea, vomiting, or to feel weak, sleepy, or fatigue. For the time period you were told by your health care provider, do not drive or use machinery. Get help right away if you have difficulty breathing, have chest pain, or vomit blood. These symptoms may be an emergency. This information is not intended to replace advice given to you by your health care provider. Make sure you discuss any questions you have with your health care provider. Document Revised: 12/17/2021 Document Reviewed: 12/17/2021 Elsevier Patient Education  2024 [employer].

## 2024-02-14 NOTE — Telephone Encounter (Signed)
 Received call from [facility] at [facility] scheduling  Patient did not hold Farxiga , cardioversion cancelled for 02/16/24  R/s for Friday 02/23/24 at 10 am with Dr.Branch, patient verbalized understanding to arrive at 8 am  Pre-op r/s for Monday 02/19/24 at 2:15 pm  She verbalized understanding to Hold Farxiga  starting Tuesday, 02/20/24   Message sent to Dr.Branch

## 2024-02-14 NOTE — Progress Notes (Signed)
 Cardioversion will need to be rescheduled.  Patient did not stop the Farxiga . The patient is aware of the cancellation.

## 2024-02-15 ENCOUNTER — Ambulatory Visit: Payer: Self-pay | Admitting: Cardiovascular Disease

## 2024-02-15 LAB — ALLERGENS W/TOTAL IGE AREA 2

## 2024-02-16 DIAGNOSIS — I484 Atypical atrial flutter: Secondary | ICD-10-CM

## 2024-02-16 DIAGNOSIS — I4892 Unspecified atrial flutter: Secondary | ICD-10-CM

## 2024-02-16 NOTE — Patient Instructions (Signed)
 [name]  02/16/2024     @PREFPERIOPPHARMACY @   Your procedure is scheduled on 02/23/2024.   Report to [facility] at 8:00 A.M.   Call this number if you have problems the morning of surgery:  [phone]  If you experience any cold or flu symptoms such as cough, fever, chills, shortness of breath, etc. between now and your scheduled surgery, please notify us  at the above number.   Remember:  Do not eat after midnight.           You may drink clear liquids until 6:00 AM .  Clear liquids allowed are:                    Water, Juice (No red color; non-citric and without pulp; diabetics please choose diet or no sugar options), Carbonated beverages (diabetics please choose diet or no sugar options), Clear Tea (No creamer, milk, or cream, including half & half and powdered creamer), Black Coffee Only (No creamer, milk or cream, including half & half and powdered creamer), and Clear Sports drink (No red color; diabetics please choose diet or no sugar options)    Take these medicines the morning of surgery with A SIP OF WATER : Pacerone    Lexapro    Pepcid    Allegra   Flonase   and Synthroid    Use your Albuterol  Inhaler before coming to the hospital.   Do not miss any Coumadin  doses prior to your procedure.      Do not wear jewelry, make-up or nail polish, including gel polish,  artificial nails, or any other type of covering on natural nails (fingers and  toes).  Do not wear lotions, powders, or perfumes, or deodorant.  Do not shave 48 hours prior to surgery.  Men may shave face and neck.  Do not bring valuables to the hospital.  [facility] is not responsible for any belongings or valuables.  Contacts, dentures or bridgework may not be worn into surgery.  Leave your suitcase in the car.  After surgery it may be brought to your room.  For patients admitted to the hospital, discharge time will be determined by your treatment team.  Patients discharged the day of surgery will  not be allowed to drive home.   Name and phone number of your driver:   Family  Special instructions:  N/A  Please read over the following fact sheets that you were given.  Care and Recovery After Surgery  Electrical Cardioversion Electrical cardioversion is the delivery of a jolt of electricity to restore a normal rhythm to the heart. A rhythm that is too fast or is not regular (arrhythmia) keeps the heart from pumping blood well. There is also another type of cardioversion called a chemical (pharmacologic) cardioversion. This is when your health care provider gives you one or more medicines to bring back your regular heart rhythm. Electrical cardioversion is done as a scheduled procedure for arrhythmiasthat are not life-threatening. Electrical cardioversion may also be done in an emergency for sudden life-threatening arrhythmias. Tell a health care provider about: Any allergies you have. All medicines you are taking, including vitamins, herbs, eye drops, creams, and over-the-counter medicines. Any problems you or family members have had with sedatives or anesthesia. Any bleeding problems you have. Any surgeries you have had, including a pacemaker, defibrillator, or other implanted device. Any medical conditions you have. Whether you are pregnant or may be pregnant. What are the risks? Your provider will talk with you about risks.  These include: Allergic reactions to medicines. Irritation to the skin on your chest or back where the sticky pads (electrodes) or paddles were put during electrical cardioversion. A blood clot that breaks free and travels to other parts of your body, such as your brain. Return of a worse abnormal heart rhythm that will need to be treated with medicines, a pacemaker, or an implantable cardioverter defibrillator (ICD). What happens before the procedure? Medicines Your provider may give you: Blood-thinning medicines (anticoagulants) so your blood does not clot  as easily. If your provider gives you this medicine, you may need to take it for 4 weeks before the procedure. Medicines to help stabilize your heart rate and rhythm. Ask your provider about: Changing or stopping your regular medicines. These include any diabetes medicines or blood thinners you take. Taking medicines such as aspirin  and ibuprofen. These medicines can thin your blood. Do not take them unless your provider tells you to. Taking over-the-counter medicines, vitamins, herbs, and supplements. General instructions Follow instructions from your provider about what you may eat and drink. Do not put any lotions, powders, or ointments on your chest and back for 24 hours before the procedure. They can cause problems with the electrodes or paddles used to deliver electricity to your heart. Do not wear jewelry as this can interfere with delivering electricity to your heart. If you will be going home right after the procedure, plan to have a responsible adult: Take you home from the hospital or clinic. You will not be allowed to drive. Care for you for the time you are told. Tests You may have an exam or testing. This may include: Blood labs. A transesophageal echocardiogram (TEE). What happens during the procedure?     An IV will be inserted into one of your veins. You will be given a sedative. This helps you relax. Electrodes or metal paddles will be placed on your chest. They may be placed in one of these ways: One placed on your right chest, the other on the left ribs. One placed on your chest and the other on your back. An electrical shock will be delivered. The shock briefly stops (resets) your heart rhythm. Your provider will check to see if your heart rhythm is now normal. Some people need only one shock. Some need more to restore a normal heart rhythm. The procedure may vary among providers and hospitals. What happens after the procedure? Your blood pressure, heart rate,  breathing rate, and blood oxygen  level will be monitored until you leave the hospital or clinic. Your heart rhythm will be watched to make sure it does not change. This information is not intended to replace advice given to you by your health care provider. Make sure you discuss any questions you have with your health care provider. Document Revised: 05/12/2022 Document Reviewed: 05/12/2022 Elsevier Patient Education  2024 Elsevier Inc.   General Anesthesia, Adult General anesthesia is the use of medicine to make you fall asleep (unconscious) for a medical procedure. General anesthesia must be used for certain procedures. It is often recommended for surgery or procedures that: Last a long time. Require you to be still or in an unusual position. Are major and can cause blood loss. Affect your breathing. The medicines used for general anesthesia are called general anesthetics. During general anesthesia, these medicines are given along with medicines that: Prevent pain. Control your blood pressure. Relax your muscles. Prevent nausea and vomiting after the procedure. Tell a health care provider about: Any allergies  you have. All medicines you are taking, including vitamins, herbs, eye drops, creams, and over-the-counter medicines. Your history of any: Medical conditions you have, including: High blood pressure. Bleeding problems. Diabetes. Heart or lung conditions, such as: Heart failure. Sleep apnea. Asthma. Chronic obstructive pulmonary disease (COPD). Current or recent illnesses, such as: Upper respiratory, chest, or ear infections. Cough or fever. Tobacco or drug use, including marijuana or alcohol use. Depression or anxiety. Surgeries and types of anesthetics you have had. Problems you or family members have had with anesthetic medicines. Whether you are pregnant or may be pregnant. Whether you have any chipped or loose teeth, dentures, caps, bridgework, or issues with your  mouth, swallowing, or choking. What are the risks? Your health care provider will talk with you about risks. These may include: Allergic reaction to the medicines. Lung and heart problems. Inhaling food or liquid from the stomach into the lungs (aspiration). Nerve injury. Injury to the lips, mouth, teeth, or gums. Stroke. Waking up during your procedure and being unable to move. This is rare. These problems are more likely to develop if you are having a major surgery or if you have an advanced or serious medical condition. You can prevent some of these complications by answering all of your health care provider's questions thoroughly and by following all instructions before your procedure. General anesthesia can cause side effects, including: Nausea or vomiting. A sore throat or hoarseness from the breathing tube. Wheezing or coughing. Shaking chills or feeling cold. Body aches. Sleepiness. Confusion, agitation (delirium), or anxiety. What happens before the procedure? When to stop eating and drinking Follow instructions from your health care provider about what you may eat and drink before your procedure. If you do not follow your health care provider's instructions, your procedure may be delayed or canceled. Medicines Ask your health care provider about: Changing or stopping your regular medicines. These include any diabetes medicines or blood thinners you take. Taking medicines such as aspirin  and ibuprofen. These medicines can thin your blood. Do not take them unless your health care provider tells you to. Taking over-the-counter medicines, vitamins, herbs, and supplements. General instructions Do not use any products that contain nicotine or tobacco for at least 4 weeks before the procedure. These products include cigarettes, chewing tobacco, and vaping devices, such as e-cigarettes. If you need help quitting, ask your health care provider. If you brush your teeth on the morning of  the procedure, make sure to spit out all of the water and toothpaste. If told by your health care provider, bring your sleep apnea device with you to surgery (if applicable). If you will be going home right after the procedure, plan to have a responsible adult: Take you home from the hospital or clinic. You will not be allowed to drive. Care for you for the time you are told. What happens during the procedure?  An IV will be inserted into one of your veins. You will be given one or more of the following through a face mask or IV: A sedative. This helps you relax. Anesthesia. This will: Numb certain areas of your body. Make you fall asleep for surgery. After you are unconscious, a breathing tube may be inserted down your throat to help you breathe. This will be removed before you wake up. An anesthesia provider, such as an anesthesiologist, will stay with you throughout your procedure. The anesthesia provider will: Keep you comfortable and safe by continuing to give you medicines and adjusting the amount of  medicine that you get. Monitor your blood pressure, heart rate, and oxygen  levels to make sure that the anesthetics do not cause any problems. The procedure may vary among health care providers and hospitals. What happens after the procedure? Your blood pressure, temperature, heart rate, breathing rate, and blood oxygen  level will be monitored until you leave the hospital or clinic. You will wake up in a recovery area. You may wake up slowly. You may be given medicine to help you with pain, nausea, or any other side effects from the anesthesia. Summary General anesthesia is the use of medicine to make you fall asleep (unconscious) for a medical procedure. Follow your health care provider's instructions about when to stop eating, drinking, or taking certain medicines before your procedure. Plan to have a responsible adult take you home from the hospital or clinic. This information is not  intended to replace advice given to you by your health care provider. Make sure you discuss any questions you have with your health care provider. Document Revised: 12/16/2021 Document Reviewed: 12/16/2021 Elsevier Patient Education  2024 [employer].

## 2024-02-19 ENCOUNTER — Encounter (HOSPITAL_COMMUNITY): Payer: Self-pay

## 2024-02-19 ENCOUNTER — Encounter (HOSPITAL_COMMUNITY)
Admission: RE | Admit: 2024-02-19 | Discharge: 2024-02-19 | Disposition: A | Discharge status: Home / Self Care | Source: Ambulatory Visit | Attending: Cardiology | Admitting: Cardiology

## 2024-02-19 VITALS — BP 105/56 | HR 77 | Temp 98.0°F | Resp 18 | Ht 62.0 in | Wt 158.7 lb

## 2024-02-19 DIAGNOSIS — Z01818 Encounter for other preprocedural examination: Secondary | ICD-10-CM | POA: Diagnosis present

## 2024-02-19 DIAGNOSIS — E119 Type 2 diabetes mellitus without complications: Secondary | ICD-10-CM | POA: Insufficient documentation

## 2024-02-19 DIAGNOSIS — Z01812 Encounter for preprocedural laboratory examination: Secondary | ICD-10-CM | POA: Insufficient documentation

## 2024-02-19 LAB — BASIC METABOLIC PANEL WITH GFR
Anion gap: 8 (ref 5–15)
BUN: 18 mg/dL (ref 8–23)
CO2: 24 mmol/L (ref 22–32)
Calcium: 8.4 mg/dL — ABNORMAL LOW (ref 8.9–10.3)
Chloride: 101 mmol/L (ref 98–111)
Creatinine, Ser: 1.76 mg/dL — ABNORMAL HIGH (ref 0.44–1.00)
GFR, Estimated: 29 mL/min — ABNORMAL LOW (ref >60)
Glucose, Bld: 133 mg/dL — ABNORMAL HIGH (ref 70–99)
Potassium: 3.4 mmol/L — ABNORMAL LOW (ref 3.5–5.1)
Sodium: 133 mmol/L — ABNORMAL LOW (ref 135–145)

## 2024-02-19 LAB — HEMOGLOBIN A1C
Hgb A1c MFr Bld: 7.4 % — ABNORMAL HIGH (ref 4.8–5.6)
Mean Plasma Glucose: 165.68 mg/dL

## 2024-02-20 ENCOUNTER — Ambulatory Visit: Attending: Cardiology | Admitting: *Deleted

## 2024-02-20 DIAGNOSIS — Z952 Presence of prosthetic heart valve: Secondary | ICD-10-CM | POA: Diagnosis not present

## 2024-02-20 DIAGNOSIS — Z5181 Encounter for therapeutic drug level monitoring: Secondary | ICD-10-CM

## 2024-02-20 LAB — POCT INR: INR: 5.7 — AB (ref 2.0–3.0)

## 2024-02-20 NOTE — Patient Instructions (Signed)
 Pending DCCV 02/16/24 Hold warfarin tonight and come for INR check tomorrow. Pt is holding off on colonoscopy procedure for now Amiodarone  200mg  decreased to 1 tablet daily on 02/15/24

## 2024-02-21 ENCOUNTER — Ambulatory Visit: Attending: Cardiology | Admitting: *Deleted

## 2024-02-21 DIAGNOSIS — Z952 Presence of prosthetic heart valve: Secondary | ICD-10-CM

## 2024-02-21 DIAGNOSIS — Z5181 Encounter for therapeutic drug level monitoring: Secondary | ICD-10-CM

## 2024-02-21 LAB — POCT INR: INR: 5 — AB (ref 2.0–3.0)

## 2024-02-21 NOTE — Patient Instructions (Addendum)
 Pending DCCV 02/23/24 at 10am.  Arrive at 8am Hold warfarin tonight, take 1/2 tablet tomorrow night then decrease dose to 1 tablet daily. (Plan discussed with C Pavero PhmD and he agreed) Eat extra greens again today. Pt is holding off on colonoscopy procedure for now Amiodarone  200mg  decreased to 1 tablet daily on 02/15/24

## 2024-02-23 ENCOUNTER — Ambulatory Visit (HOSPITAL_COMMUNITY): Admitting: Anesthesiology

## 2024-02-23 ENCOUNTER — Encounter (HOSPITAL_COMMUNITY)
Admission: RE | Disposition: A | Discharge status: Home / Self Care | Payer: Self-pay | Source: Home / Self Care | Attending: Cardiology

## 2024-02-23 ENCOUNTER — Ambulatory Visit (HOSPITAL_COMMUNITY)
Admission: RE | Admit: 2024-02-23 | Discharge: 2024-02-23 | Disposition: A | Discharge status: Home / Self Care | Attending: Cardiology | Admitting: Cardiology

## 2024-02-23 DIAGNOSIS — J4489 Other specified chronic obstructive pulmonary disease: Secondary | ICD-10-CM | POA: Diagnosis not present

## 2024-02-23 DIAGNOSIS — I130 Hypertensive heart and chronic kidney disease with heart failure and stage 1 through stage 4 chronic kidney disease, or unspecified chronic kidney disease: Secondary | ICD-10-CM | POA: Insufficient documentation

## 2024-02-23 DIAGNOSIS — J441 Chronic obstructive pulmonary disease with (acute) exacerbation: Secondary | ICD-10-CM | POA: Diagnosis not present

## 2024-02-23 DIAGNOSIS — Z952 Presence of prosthetic heart valve: Secondary | ICD-10-CM | POA: Insufficient documentation

## 2024-02-23 DIAGNOSIS — I4892 Unspecified atrial flutter: Secondary | ICD-10-CM

## 2024-02-23 DIAGNOSIS — J449 Chronic obstructive pulmonary disease, unspecified: Secondary | ICD-10-CM | POA: Diagnosis not present

## 2024-02-23 DIAGNOSIS — I5032 Chronic diastolic (congestive) heart failure: Secondary | ICD-10-CM | POA: Diagnosis not present

## 2024-02-23 DIAGNOSIS — E1122 Type 2 diabetes mellitus with diabetic chronic kidney disease: Secondary | ICD-10-CM | POA: Insufficient documentation

## 2024-02-23 DIAGNOSIS — I484 Atypical atrial flutter: Secondary | ICD-10-CM

## 2024-02-23 DIAGNOSIS — I110 Hypertensive heart disease with heart failure: Secondary | ICD-10-CM

## 2024-02-23 DIAGNOSIS — I4819 Other persistent atrial fibrillation: Secondary | ICD-10-CM | POA: Diagnosis not present

## 2024-02-23 DIAGNOSIS — N182 Chronic kidney disease, stage 2 (mild): Secondary | ICD-10-CM | POA: Diagnosis not present

## 2024-02-23 DIAGNOSIS — I4891 Unspecified atrial fibrillation: Secondary | ICD-10-CM

## 2024-02-23 DIAGNOSIS — I509 Heart failure, unspecified: Secondary | ICD-10-CM | POA: Diagnosis not present

## 2024-02-23 HISTORY — PX: CARDIOVERSION: SHX1299

## 2024-02-23 LAB — GLUCOSE, CAPILLARY: Glucose-Capillary: 109 mg/dL — ABNORMAL HIGH (ref 70–99)

## 2024-02-23 SURGERY — CARDIOVERSION
Anesthesia: General

## 2024-02-23 MED ORDER — BISOPROLOL FUMARATE 5 MG PO TABS: 5.0000 mg | ORAL_TABLET | Freq: Every day | ORAL | 3 refills | Status: DC

## 2024-02-23 MED ORDER — SODIUM CHLORIDE 0.9 % IV SOLN: INTRAVENOUS | Status: DC

## 2024-02-23 MED ORDER — PROPOFOL 10 MG/ML IV BOLUS
INTRAVENOUS | Status: DC | PRN
  Administered 2024-02-23: 100 mg via INTRAVENOUS

## 2024-02-23 MED ORDER — ORAL CARE MOUTH RINSE: 15.0000 mL | Freq: Once | OROMUCOSAL | Status: DC

## 2024-02-23 MED ORDER — CHLORHEXIDINE GLUCONATE 0.12 % MT SOLN: 15.0000 mL | Freq: Once | OROMUCOSAL | Status: DC

## 2024-02-23 MED ORDER — PROPOFOL 10 MG/ML IV BOLUS
INTRAVENOUS | Status: AC
  Filled 2024-02-23: qty 20

## 2024-02-23 MED ORDER — LACTATED RINGERS IV SOLN: INTRAVENOUS | Status: DC

## 2024-02-23 NOTE — Progress Notes (Signed)
 Electrical Cardioversion Procedure Note [name] [id number] 07-May-1946  Procedure: Electrical Cardioversion Indications:  Atrial Fibrillation and Atrial Flutter  Procedure Details Consent: on chart and signed Time Out: Verified patient identification, verified procedure, site/side was marked, verified correct patient position, special equipment/implants available, medications/allergies/relevent history reviewed, required imaging and test results available.  1016  Patient placed on cardiac monitor, pulse oximetry, supplemental oxygen  as necessary.  Sedation given: propofol  Pacer pads placed anterior and posterior chest.  Cardioverted 1 time(s).  Cardioverted at 200J.  Evaluation Findings: Post procedure EKG shows: sinus brady Complications: None Patient did tolerate procedure well. did   Alain Howard 02/23/2024, 10:46 AM

## 2024-02-23 NOTE — CV Procedure (Signed)
 CV Procedure Note  Procedure: Electrical cardioversion Physician: Dr Armida Lander MD Indication: persistent atrial flutter   Patient was brought to the procedure suite after appropriate consent was obtained. INRs were confirmed to have been therapeutic over the last 3 week period. Defib pads placed in the anterior and posterior positions. Sedation achieved with the assistance of anesthesiology, for details please refer to their documentation. Succesfully converted from atrial flutter to normal sinus rhythm with a single synchronized 200j shock. Cardiopulmonary monitoring was performed throughout the procedure, she tolerated well without complications.  Post conversion rates high 50s to low 60s, first degree AV block. We will lower her bisoprolol  from 15mg  to 5mg  daily. Continue amiodarone  at 200mg  daily.   Armida Lander MD

## 2024-02-23 NOTE — Anesthesia Procedure Notes (Signed)
 Date/Time: 02/23/2024 10:07 AM  Performed by: Verline Glow, CRNAPre-anesthesia Checklist: Patient identified, Emergency Drugs available, Suction available, Timeout performed and Patient being monitored Patient Re-evaluated:Patient Re-evaluated prior to induction Oxygen  Delivery Method: Nasal Cannula

## 2024-02-23 NOTE — Anesthesia Preprocedure Evaluation (Addendum)
 Anesthesia Evaluation  Patient identified by MRN, date of birth, ID band Patient awake    Reviewed: Allergy & Precautions, H&P , NPO status , Patient's Chart, lab work & pertinent test results  Airway Mallampati: II  TM Distance: >3 FB Neck ROM: Full    Dental no notable dental hx.    Pulmonary shortness of breath, asthma , pneumonia, COPD   Pulmonary exam normal breath sounds clear to auscultation       Cardiovascular hypertension, +CHF  Normal cardiovascular exam+ dysrhythmias Atrial Fibrillation  Rhythm:Irregular Rate:Abnormal  Aortic valve replacement 2014 EF 55% 2025   Neuro/Psych CVA  negative psych ROS   GI/Hepatic Neg liver ROS,GERD  ,,  Endo/Other  diabetesHypothyroidism    Renal/GU Renal disease  negative genitourinary   Musculoskeletal negative musculoskeletal ROS (+)    Abdominal   Peds negative pediatric ROS (+)  Hematology  (+) Blood dyscrasia, anemia   Anesthesia Other Findings   Reproductive/Obstetrics negative OB ROS                             Anesthesia Physical Anesthesia Plan  ASA: 3  Anesthesia Plan: General   Post-op Pain Management:    Induction: Intravenous  PONV Risk Score and Plan:   Airway Management Planned: Nasal Cannula  Additional Equipment:   Intra-op Plan:   Post-operative Plan:   Informed Consent: I have reviewed the patients History and Physical, chart, labs and discussed the procedure including the risks, benefits and alternatives for the proposed anesthesia with the patient or authorized representative who has indicated his/her understanding and acceptance.     Dental advisory given  Plan Discussed with: CRNA  Anesthesia Plan Comments:        Anesthesia Quick Evaluation

## 2024-02-23 NOTE — Anesthesia Postprocedure Evaluation (Signed)
 Anesthesia Post Note  Patient: [name]  Procedure(s) Performed: CARDIOVERSION  Patient location during evaluation: PACU Anesthesia Type: General Level of consciousness: awake and alert Pain management: pain level controlled Vital Signs Assessment: post-procedure vital signs reviewed and stable Respiratory status: spontaneous breathing, nonlabored ventilation, respiratory function stable and patient connected to nasal cannula oxygen  Cardiovascular status: blood pressure returned to baseline and stable Postop Assessment: no apparent nausea or vomiting Anesthetic complications: no  No notable events documented.   Last Vitals:  Vitals:   02/23/24 1115 02/23/24 1135  BP: (!) 151/67 (!) 142/55  Pulse: 63 65  Resp: 17 15  Temp:  (!) 36.3 C  SpO2: 94% 98%    Last Pain:  Vitals:   02/23/24 1135  TempSrc: Oral  PainSc: 0-No pain                 Beacher Limerick

## 2024-02-23 NOTE — H&P (Signed)
 Procedure H&P  Patient presents for elective outpatient electrical cardioversion for atrial flutter. She is on coumadin  for anticoagulation with therapeutic to supratherapeutic INRs as listed below.   3/30 INR 3 4/17 INR 2.9 5/12 INR 3.3 5/20 INR 5.7  5/21 INR 5  Plan for electrical cardioversion with the assistance of anesthesiology. For full medical history please refer to referenced clinic note below  Armida Lander MD   Ms. Banwart is a 78 y.o.female seen today for follow up of the following medical problems.   1. Valvular heart disease   - 07/18/13 patient underwent MVR with 25 mm Edwards Magna-ease pericardial valve and also AVR with 21 mm Edwards Magna-ease pericardial valve    -03/2022 echo: LVEF 60%, indet diastolic, normal MVR and AVR. 12/2023 echo: LVEF 55%, grade II dd, mod RV dysfunction, normal MVR and AVR     2.Chronic HFpEF  11/2022 echo: LVEF 55-60%, indet diastolic fxn, mod pulm HTN, normal MVR and AVR 12/2023 echo: LVEF 55%, grade II dd, mod RV dysfunction, normal MVR and AVR  - sigifnicant SOB/DOE since Christmas - significant increase DOE. Up until walking 1/3 mile comfortable, now DOE just walking to mail box - some chest tightness at times. Midchest, 5/10 in severity. Not positional. Can last few hours constant. - mild increase in LE edema, taking extra torsemide .  - increased cough, no wheezing. Nonproductive cough - home weights 167 lbs.  - palpitations at times with activity, not at reast.    12/31/23 ER visit with SOB - BNP 300s, CXR with cardiomegaly and pulm vascular congestion, mild edema - given IV lasix  with improved symptoms in ER, discharged - no recent edema - compliant with meds - home weights 159 lbs stable. Down from 167 last visit - takes torsemide  20mg  daily. Has had uptrend in Cr with most recent pcp labs, Cr up to 1.4     3. Afib/aflutter - hx of cardioembolic CVA, has been on longterm anticoag - DCCV 01/04/23 - from Dr Katheryne Pane notes  if recurent aflutter consider amiodarone . -  -discussed EKG with EP last visit, thought to be atypical flutter or atach.  - EKG today looks more evidence suspecting atypical flutter - she reports ongoing palpitations, SOB with activities.                Other medical problems not addressed this visit           Other medical issues not addressed this visit       3. HTN - she is compliantw ith meds     4. Hyperlipidemia - recent labs with pcp, compliant with stin   06/2021 TC 185 TG 75 HDL 66 LDL 104 01/2022 TC 177 TG 133 HDL 58 LDL 96   5. Carotid stenosis - 07/2013 US  with mild bilateral stenosis 2018 mild disease - 01/2021 US  mild bilateral disease - 11/2022 mild bilateral disease        6. Obstructive lung disease 2014 PFTs moderate obstruction     7. Syncope -admit 11/2022, from notes intense shoulder pain which triggered syncope suggesting vasovagal etiology. Had low bp and HRs     Past Medical History:  Diagnosis Date   Anemia     Asthma     Atrial fibrillation (HCC)     Breast carcinoma (HCC) 1995    1995   Cardioembolic stroke (HCC) 01/2012    Right frontal in 01/2012; normal carotid ultrasound; possible LAA thrombus by TEE; virtual complete neurologic recovery   Chronic  kidney disease, stage 2, mildly decreased GFR      GFR of approximately 60   Depression     Diabetes mellitus without complication (HCC)     Diverticulosis of colon (without mention of hemorrhage) 2012    Dr. Homero Luster   Fasting hyperglycemia      120 fasting   Gastroesophageal reflux disease     Gastroparesis     Hemorrhoids     Hyperlipidemia     Hypertension      pt denies 05/30/13     Dr Roann Chestnut   Hyponatremia     Rheumatic heart disease      a. 07/2013 Echo: Ef 55-60%, Mod AS/AI, Mod-Sev MS, sev dil LA, PASP 58;  b. 07/2013 TEE: EF 35-40%, mild-mod AS/AI, mod-sev MS, LA smoke;  c. 07/2013 Cath: elev R heart pressures, nl cors.   Shortness of breath               Allergies      Allergies  Allergen Reactions   Amiodarone  Rash                Current Outpatient Medications  Medication Sig Dispense Refill   albuterol  (PROVENTIL ) (2.5 MG/3ML) 0.083% nebulizer solution USE 1 VIAL IN NEBULIZER EVERY4 HOURS AS NEEDED. 180 mL 5   albuterol  (VENTOLIN  HFA) 108 (90 Base) MCG/ACT inhaler Inhale 2 puffs into the lungs every 6 (six) hours as needed for wheezing or shortness of breath.       atorvastatin  (LIPITOR) 40 MG tablet TAKE ONE TABLET BY MOUTH ONCE DAILY. 90 tablet 3   bisoprolol  (ZEBETA ) 10 MG tablet Take 1 tablet (10 mg total) by mouth daily.       budesonide -formoterol  (SYMBICORT ) 80-4.5 MCG/ACT inhaler Take 2 puffs first thing in am and then another 2 puffs about 12 hours later. 1 each 12   dextromethorphan -guaiFENesin  (MUCINEX  DM) 30-600 MG 12hr tablet Take 1 tablet by mouth 2 (two) times daily as needed for cough.       escitalopram  (LEXAPRO ) 10 MG tablet Take 10 mg by mouth daily with breakfast.       famotidine  (PEPCID ) 20 MG tablet TAKE ONE TABLET BY MOUTH EVERY DAY AFTER SUPPER 30 tablet 0   FARXIGA  10 MG TABS tablet Take 10 mg by mouth daily.       fexofenadine (ALLEGRA) 180 MG tablet Take 180 mg by mouth in the morning.       fluticasone  (FLONASE ) 50 MCG/ACT nasal spray Place 1 spray into both nostrils daily.       gabapentin  (NEURONTIN ) 300 MG capsule Take 1 capsule (300 mg total) by mouth 2 (two) times daily. 180 capsule 3   levothyroxine  (SYNTHROID ) 100 MCG tablet Take 100 mcg by mouth daily before breakfast.       metFORMIN  (GLUCOPHAGE ) 500 MG tablet Take 1 tablet (500 mg total) by mouth 2 (two) times daily with a meal. 60 tablet 1   Multiple Vitamin (MULITIVITAMIN WITH MINERALS) TABS Take 1 tablet by mouth daily.       pantoprazole  (PROTONIX ) 40 MG tablet TAKE 1 TABLET 30 TO 60 MINUTES BEFORE FIRST MEAL OF THE DAY. 90 tablet 1   potassium chloride  SA (KLOR-CON  M) 20 MEQ tablet Take 1 tablet (20 mEq total) by mouth daily. 30 tablet 0    torsemide  (DEMADEX ) 20 MG tablet Take 1 tablet (20 mg total) by mouth daily. (May take an additional 20mg  as needed for swelling) 45 tablet 6   warfarin (  COUMADIN ) 2.5 MG tablet TAKE AS DIRECTED BY COUMADIN  CLINIC. 40 tablet 3      No current facility-administered medications for this visit.               Past Surgical History:  Procedure Laterality Date   AORTIC VALVE REPLACEMENT N/A 07/18/2013    Procedure: AORTIC VALVE REPLACEMENT (AVR);  Surgeon: Bartley Lightning, MD;  Location: [facility] OR;  Service: Open Heart Surgery;  Laterality: N/A;   BACK SURGERY       BREAST LUMPECTOMY Right 1995   CARDIAC CATHETERIZATION       CARDIOVERSION N/A 07/26/2013    Procedure: CARDIOVERSION;  Surgeon: Elmyra Haggard, MD;  Location[facility] ENDOSCOPY;  Service: Cardiovascular;  Laterality: N/A;   CARDIOVERSION N/A 01/04/2023    Procedure: CARDIOVERSION;  Surgeon: Sonny Dust, MD;  Location: [facility] ENDOSCOPY;  Service: Cardiovascular;  Laterality: N/A;   COLONOSCOPY   2012    Negative screening procedure   DILATION AND CURETTAGE OF UTERUS       ESOPHAGEAL MANOMETRY N/A 06/17/2013    Procedure: ESOPHAGEAL MANOMETRY (EM);  Surgeon: Tessa Figures, MD;  Location: [facility] ENDOSCOPY;  Service: Endoscopy;  Laterality: N/A;   INTRAOPERATIVE TRANSESOPHAGEAL ECHOCARDIOGRAM N/A 07/18/2013    Procedure: INTRAOPERATIVE TRANSESOPHAGEAL ECHOCARDIOGRAM;  Surgeon: Bartley Lightning, MD;  Location: [facility] OR;  Service: Open Heart Surgery;  Laterality: N/A;   LEFT AND RIGHT HEART CATHETERIZATION WITH CORONARY ANGIOGRAM N/A 07/10/2013    Procedure: LEFT AND RIGHT HEART CATHETERIZATION WITH CORONARY ANGIOGRAM;  Surgeon: Odie Benne, MD;  Location: [facility] CATH LAB;  Service: Cardiovascular;  Laterality: N/A;   LUMBAR LAMINECTOMY/DECOMPRESSION MICRODISCECTOMY Right 05/13/2014    Procedure: LUMBAR LAMINECTOMY/DECOMPRESSION MICRODISCECTOMY 1 LEVEL  lumbar four/five;  Surgeon: Augustine Blocker, MD;  Location: [facility] NEURO ORS;  Service:  Neurosurgery;  Laterality: Right;   MITRAL VALVE REPLACEMENT N/A 07/18/2013    Procedure: MITRAL VALVE (MV) REPLACEMENT;  Surgeon: Bartley Lightning, MD;  Location: [facility] OR;  Service: Open Heart Surgery;  Laterality: N/A;   MITRAL VALVE SURGERY   [facility], closed mitral valvulotomy by finger fracture   TEE WITHOUT CARDIOVERSION   01/24/2012    Procedure: TRANSESOPHAGEAL ECHOCARDIOGRAM (TEE);  Surgeon: Lenise Quince, MD;  Location: [facility] ENDOSCOPY;  Service: Cardiovascular;  Laterality: N/A;   TEE WITHOUT CARDIOVERSION N/A 07/11/2013    Procedure: TRANSESOPHAGEAL ECHOCARDIOGRAM (TEE);  Surgeon: Lake Pilgrim, MD;  Location: [facility] ENDOSCOPY;  Service: Cardiovascular;  Laterality: N/A;   TEE WITHOUT CARDIOVERSION N/A 07/26/2013    Procedure: TRANSESOPHAGEAL ECHOCARDIOGRAM (TEE);  Surgeon: Elmyra Haggard, MD;  Location: [facility] ENDOSCOPY;  Service: Cardiovascular;  Laterality: N/A;   TUBAL LIGATION   1973            Allergies      Allergies  Allergen Reactions   Amiodarone  Rash                 Family History  Problem Relation Age of Onset   Heart disease Brother 74        MI   Rheum arthritis Maternal Grandmother     Asthma Maternal Grandfather     Hypothyroidism Mother     Diabetes Sister     Cirrhosis Father              Social History Ms. Cavan reports that she has never smoked. She has never used smokeless tobacco. Ms. Geis reports no history of alcohol use.  Physical Examination    Today's Vitals    01/15/24 0903  BP: 118/68  Pulse: 96  SpO2: 93%  Weight: 163 lb (73.9 kg)  Height: 5\' 2"  (1.575 m)    Body mass index is 29.81 kg/m.   Gen: resting comfortably, no acute distress HEENT: no scleral icterus, pupils equal round and reactive, no palptable cervical adenopathy,  CV: regular rate and rhythm Resp: Clear to auscultation bilaterally GI: abdomen is soft, non-tender, non-distended, normal bowel sounds, no hepatosplenomegaly MSK: extremities are  warm, no edema.  Skin: warm, no rash Neuro:  no focal deficits Psych: appropriate affect     Diagnostic Studies 07/2013 TTE Study Conclusions  - Study data: Technically difficult study. - Left ventricle: The cavity size was normal. Wall thickness   was normal. Systolic function was normal. The estimated   ejection fraction was in the range of 55% to 60%.   Indeterminate diastolic function. There is evidence of   very high left atrial pressures (E/e' 63) - Aortic valve: Trileaflet; mildly thickened leaflets.   Accurate evaluation of aortic stenosis limited in the   setting of severe mitral stenosis and decreased   ventricular loadin. Morphologically there does not appear   to be significant stenosis. By gradient (mean PG 21 mmHg)   the stenosis is moderate, by AVA VTI (0.8) stenosis is   severe. Moderate regurgitation, AR PHT is 439 cm/s. Valve   area: 0.78cm^2(VTI). Valve area: 0.78cm^2 (Vmax). - Mitral valve: The findings are consistent with moderate to   severe stenosis. (Mean PG 12 mmHg, MVA by PHT 1.3. Cannot   use MVA by VTI due to moderate AI. Valve area by pressure   half-time: 1.38cm^2. - Left atrium: The atrium was severely dilated. - Right ventricle: Systolic function was mildly reduced, RV   TAPSE is 1.4, TV systolic annular tissue velocity 10 cm/s. - Pulmonary arteries: Systolic pressure was moderately   increased. PA peak pressure: 58mm Hg (S) (PASP based on a   fairly faint spectral Doppler waveform)     07/2013 TEE Study Conclusions  - Left ventricle: Systolic function was moderately reduced.   The estimated ejection fraction was in the range of 35% to   40%. - Aortic valve: There was mild to moderate stenosis. Mild to   moderate regurgitation. - Mitral valve: The findings are consistent with moderate to   severe stenosis. Valve area by pressure half-time:   1.45cm^2. - Left atrium: There was spontaneous echo contrast   ("smoke"). - Atrial septum: No  defect or patent foramen ovale was   identified. - Tricuspid valve: No evidence of vegetation. Transesophageal echocardiography.  2D and color Doppler. Height:  Height: 157.5cm. Height: 62in.  Weight:  Weight: 75kg. Weight: 165lb.  Body mass index:  BMI: 30.2kg/m^2. Body surface area:    BSA: 1.23m^2.  Blood pressure: 143/56.  Patient status:  Inpatient.  Location:  Endoscopy.      06/2018 echo Study Conclusions   - Left ventricle: The cavity size was normal. Wall thickness was   normal. Systolic function was normal. The estimated ejection   fraction was in the range of 60% to 65%. - Aortic valve: AV prosthesis is difficult to see Peak and mean   gradients through the valve are 18 and 10 mm Hg respectively. - Mitral valve: Peak and mean gradients through the valve   prosthesis are 19 and 4 mm Hg respectively MVA by P T1/2 is 3   cm2. s/p MV prosthesis. Valve prosthesis is  difficult to see. - Pulmonary arteries: PA peak pressure: 48 mm Hg (S).     04/2020 echo IMPRESSIONS     1. Left ventricular ejection fraction, by estimation, is 60 to 65%. The  left ventricle has normal function. The left ventricle has no regional  wall motion abnormalities. Left ventricular diastolic parameters are  indeterminate.   2. Right ventricular systolic function is low normal. The right  ventricular size is normal. There is moderately elevated pulmonary artery  systolic pressure. The estimated right ventricular systolic pressure is  45.5 mmHg.   3. Left atrial size was mildly dilated.   4. The mitral valve has been repaired/replaced. No evidence of mitral  valve regurgitation. The mean mitral valve gradient is 5.0 mmHg. There is  a 25 mm Magna Ease bioprosthetic valve present in the mitral position.  Grossly normal function.   5. The aortic valve has been repaired/replaced. Aortic valve  regurgitation is not visualized. There is a 21 mm Magna Ease bioprosthetic  valve present in the aortic  position. Aortic valve mean gradient measures  7.0 mmHg. Grossly normal function.   6. The inferior vena cava is normal in size with greater than 50%  respiratory variability, suggesting right atrial pressure of 3 mmHg.     07/2013 Cath Impression: 1. No angiographic evidence of CAD 2. Elevated filling pressures     01/2021 nuclear stress   Atrial fibrillation present throughout. No diagnostic ST segment changes to indicate ischemia. Small, mild to moderate intensity, mid to apical anterior defect. This is fixed with the exception of a small degree of partial reversibility in the mid zone. Suggestive of scar versus variable soft tissue attenuation. This is a low risk study. Nuclear stress EF: 50%.   01/2021 carotid US  Summary:  Right Carotid: Velocities in the right ICA are consistent with a 1-39%  stenosis.   Left Carotid: Velocities in the left ICA are consistent with a 1-39%  stenosis.   Vertebrals:  Bilateral vertebral arteries demonstrate antegrade flow.  Subclavians: Normal flow hemodynamics were seen in bilateral subclavian               arteries.                Assessment and Plan  1. Chronic diastolic HF - appears euvolemic today, with uptrending Cr hold torsemide  x 2 days and then resume prior dosing - I think her ongoing SOB is more related to her recurrent arrhythmia   2. Afib/aflutter - EKG looks to be atypical aflutter today on my evaluation - she does report palpitations, SOB/DOE - increase bisoprolol  to 15mg  daily - have her f/u with Dr Arlester Ladd, from prior visit 04/21/23 would consider for amio if recurrent aflutter. Will ask for EP to evaluate her     3.Valvular heart disease - overall normal MVR and AVR function by most recent echo       Laurann Pollock, M.D.

## 2024-02-23 NOTE — Transfer of Care (Signed)
 Immediate Anesthesia Transfer of Care Note  Patient: [name]  Procedure(s) Performed: CARDIOVERSION  Patient Location: PACU  Anesthesia Type:General  Level of Consciousness: drowsy and patient cooperative  Airway & Oxygen  Therapy: Patient Spontanous Breathing and Patient connected to nasal cannula oxygen   Post-op Assessment: Report given to RN and Post -op Vital signs reviewed and stable  Post vital signs: Reviewed and stable  Last Vitals:  Vitals Value Taken Time  BP 118/53 02/23/2024  1029  Temp 97.5 02/23/2024  1029  Pulse 59 02/23/2024  1029  Resp 12 02/23/2024  1029  SpO2 96 02/23/2024  1029    Last Pain:  Vitals:   02/23/24 0916  TempSrc: Oral  PainSc: 0-No pain         Complications: No notable events documented.

## 2024-02-24 ENCOUNTER — Encounter (HOSPITAL_COMMUNITY): Payer: Self-pay | Admitting: Cardiology

## 2024-02-27 ENCOUNTER — Ambulatory Visit: Attending: Cardiology | Admitting: *Deleted

## 2024-02-27 ENCOUNTER — Other Ambulatory Visit: Payer: Self-pay | Admitting: Primary Care

## 2024-02-27 DIAGNOSIS — Z5181 Encounter for therapeutic drug level monitoring: Secondary | ICD-10-CM | POA: Diagnosis not present

## 2024-02-27 DIAGNOSIS — Z952 Presence of prosthetic heart valve: Secondary | ICD-10-CM | POA: Diagnosis not present

## 2024-02-27 LAB — POCT INR: INR: 3.4 — AB (ref 2.0–3.0)

## 2024-02-27 NOTE — Patient Instructions (Signed)
 S/P DCCV 02/23/24  1st post DCCV Continue warfarin 1 tablet daily. Pt is holding off on colonoscopy procedure for now Amiodarone  200mg  decreased to 1 tablet daily on 02/15/24

## 2024-02-27 NOTE — Telephone Encounter (Unsigned)
 Copied from CRM [id number]. Topic: Clinical - Medication Refill >> Feb 27, 2024 12:12 PM Whitney O wrote: Medication: Fluticasone -Umeclidin-Vilant (TRELEGY ELLIPTA ) 100-62.5-25 MCG/ACT AEPB  Has the patient contacted their pharmacy? Yes they had no record of new prescription (Agent: If no, request that the patient contact the pharmacy for the refill. If patient does not wish to contact the pharmacy document the reason why and proceed with request.) (Agent: If yes, when and what did the pharmacy advise?)  This is the patient's preferred pharmacy:  [facility] [place], [place] - [id number] [place] [address] Phone: [phone] Fax: [phone]  [facility] - [place], [place] - [address] [place] [address] Phone: [phone] Fax: [phone]  Is this the correct pharmacy for this prescription? Yes If no, delete pharmacy and type the correct one.  [facility] [place], [place] - [id number] [place] [address] Phone: [phone] Fax: [phone]   Has the prescription been filled recently? No  Is the patient out of the medication? Yes  Has the patient been seen for an appointment in the last year OR does the patient have an upcoming appointment? Yes 02/12/2024  Can we respond through MyChart? Yes  Agent: Please be advised that Rx refills may take up to 3 business days. We ask that you follow-up with your pharmacy.

## 2024-02-28 ENCOUNTER — Other Ambulatory Visit: Payer: Self-pay

## 2024-02-28 MED ORDER — TRELEGY ELLIPTA 100-62.5-25 MCG/ACT IN AEPB: 1.0000 | INHALATION_SPRAY | Freq: Every day | RESPIRATORY_TRACT | 3 refills | Status: DC

## 2024-02-28 MED ORDER — TRELEGY ELLIPTA 100-62.5-25 MCG/ACT IN AEPB: 1.0000 | INHALATION_SPRAY | Freq: Every day | RESPIRATORY_TRACT | 5 refills | Status: DC

## 2024-03-01 ENCOUNTER — Other Ambulatory Visit: Payer: Self-pay | Admitting: Primary Care

## 2024-03-01 MED ORDER — TRELEGY ELLIPTA 100-62.5-25 MCG/ACT IN AEPB: 1.0000 | INHALATION_SPRAY | Freq: Every day | RESPIRATORY_TRACT | 3 refills | Status: DC

## 2024-03-01 NOTE — Telephone Encounter (Signed)
 Copied from CRM [id number]. Topic: Clinical - Prescription Issue >> Mar 01, 2024 10:20 AM [name] wrote: Reason for CRM:  Patient took 14 days sample of Trelogy and will take last pill on Sunday. I completed request to refill but patient is concerned about a MyChart message they received with a pharmacy they had never heard. Would like a call back from office.

## 2024-03-01 NOTE — Telephone Encounter (Signed)
 Spoke with [facility] and they have no account or record for this patient. They do not show any active rxs at this time.   Resent rx to [facility]. MyChart message sent to patient to advise. Nothing further needed at this time.

## 2024-03-01 NOTE — Telephone Encounter (Signed)
 Copied from CRM [id number]. Topic: Clinical - Medication Refill >> Mar 01, 2024 10:17 AM Eveleen Hinds B wrote: Medication:  Fluticasone -Umeclidin-Vilant (TRELEGY ELLIPTA ) 100-62.5-25 MCG/ACT AEPB  Has the patient contacted their pharmacy? Yes (Agent: If no, request that the patient contact the pharmacy for the refill. If patient does not wish to contact the pharmacy document the reason why and proceed with request.) (Agent: If yes, when and what did the pharmacy advise?)  This is the patient's preferred pharmacy:  [facility] [place], [place] - [id number] [place] [address] Phone: [phone] Fax: [phone]  Is this the correct pharmacy for this prescription? Yes If no, delete pharmacy and type the correct one.   Has the prescription been filled recently? No  Is the patient out of the medication? Yes  Has the patient been seen for an appointment in the last year OR does the patient have an upcoming appointment? Yes  Can we respond through MyChart? Yes  Agent: Please be advised that Rx refills may take up to 3 business days. We ask that you follow-up with your pharmacy.

## 2024-03-05 ENCOUNTER — Ambulatory Visit: Attending: Cardiology | Admitting: *Deleted

## 2024-03-05 ENCOUNTER — Ambulatory Visit: Admitting: Internal Medicine

## 2024-03-05 DIAGNOSIS — Z5181 Encounter for therapeutic drug level monitoring: Secondary | ICD-10-CM | POA: Diagnosis not present

## 2024-03-05 DIAGNOSIS — Z952 Presence of prosthetic heart valve: Secondary | ICD-10-CM

## 2024-03-05 LAB — POCT INR: INR: 4 — AB (ref 2.0–3.0)

## 2024-03-05 NOTE — Patient Instructions (Signed)
 S/P DCCV 02/23/24  1st post DCCV Hold warfarin tonight then decrease dose to 1 tablet daily except 1/2 tablet on Sundays Pt is holding off on colonoscopy procedure for now Amiodarone  200mg  decreased to 1 tablet daily on 02/15/24

## 2024-03-12 ENCOUNTER — Telehealth: Payer: Self-pay

## 2024-03-12 NOTE — Telephone Encounter (Signed)
 [facility] states the policy is that they can not send out inhaler unless they are accompanied with neb meds possibly due to her insurance, she will need to get the Trelegy sent to a local pharmacy.   Called pt and she picked up her Trelegy at the local pharmacy she uses on Thursday of last week.   Closing enct

## 2024-03-12 NOTE — Telephone Encounter (Signed)
 Copied from CRM [id number]. Topic: Clinical - Prescription Issue >> Mar 12, 2024 10:47 AM Crist Dominion wrote: Reason for CRM: [facility] states that the odder for  Fluticasone -Umeclidin-Vilant (TRELEGY ELLIPTA ) 100-62.5-25 MCG/ACT AEPB must be accompanied by an order for a nebulizer treatment as well. If the patient is not currently taking nebulizer treatments, then the prescription for Trelegy will need to be sent to a different pharmacy.

## 2024-03-13 ENCOUNTER — Ambulatory Visit: Attending: Cardiology | Admitting: *Deleted

## 2024-03-13 DIAGNOSIS — Z5181 Encounter for therapeutic drug level monitoring: Secondary | ICD-10-CM | POA: Diagnosis not present

## 2024-03-13 DIAGNOSIS — Z952 Presence of prosthetic heart valve: Secondary | ICD-10-CM

## 2024-03-13 LAB — POCT INR: INR: 2.4 (ref 2.0–3.0)

## 2024-03-13 NOTE — Patient Instructions (Addendum)
 S/P DCCV 02/23/24  3rd post DCCV Continue warfarin 1 tablet daily except 1/2 tablet on Sundays Pt is holding off on colonoscopy procedure for now Amiodarone  200mg  decreased to 1 tablet daily on 02/15/24

## 2024-03-14 ENCOUNTER — Emergency Department (HOSPITAL_COMMUNITY)
Admission: EM | Admit: 2024-03-14 | Discharge: 2024-03-14 | Disposition: A | Discharge status: Home / Self Care | Attending: Emergency Medicine | Admitting: Emergency Medicine

## 2024-03-14 ENCOUNTER — Encounter (HOSPITAL_COMMUNITY): Payer: Self-pay | Admitting: Emergency Medicine

## 2024-03-14 ENCOUNTER — Other Ambulatory Visit: Payer: Self-pay

## 2024-03-14 ENCOUNTER — Emergency Department (HOSPITAL_COMMUNITY)

## 2024-03-14 DIAGNOSIS — I2510 Atherosclerotic heart disease of native coronary artery without angina pectoris: Secondary | ICD-10-CM | POA: Insufficient documentation

## 2024-03-14 DIAGNOSIS — Z7901 Long term (current) use of anticoagulants: Secondary | ICD-10-CM | POA: Diagnosis not present

## 2024-03-14 DIAGNOSIS — R7989 Other specified abnormal findings of blood chemistry: Secondary | ICD-10-CM | POA: Diagnosis not present

## 2024-03-14 DIAGNOSIS — R600 Localized edema: Secondary | ICD-10-CM | POA: Insufficient documentation

## 2024-03-14 DIAGNOSIS — I517 Cardiomegaly: Secondary | ICD-10-CM | POA: Diagnosis not present

## 2024-03-14 DIAGNOSIS — R011 Cardiac murmur, unspecified: Secondary | ICD-10-CM | POA: Diagnosis not present

## 2024-03-14 DIAGNOSIS — R0602 Shortness of breath: Secondary | ICD-10-CM | POA: Diagnosis not present

## 2024-03-14 DIAGNOSIS — I509 Heart failure, unspecified: Secondary | ICD-10-CM | POA: Diagnosis not present

## 2024-03-14 DIAGNOSIS — J90 Pleural effusion, not elsewhere classified: Secondary | ICD-10-CM | POA: Diagnosis not present

## 2024-03-14 LAB — CBC WITH DIFFERENTIAL/PLATELET
Abs Immature Granulocytes: 0.05 10*3/uL (ref 0.00–0.07)
Basophils Absolute: 0.1 10*3/uL (ref 0.0–0.1)
Basophils Relative: 1 %
Eosinophils Absolute: 0.2 10*3/uL (ref 0.0–0.5)
Eosinophils Relative: 3 %
HCT: 37.9 % (ref 36.0–46.0)
Hemoglobin: 11.8 g/dL — ABNORMAL LOW (ref 12.0–15.0)
Immature Granulocytes: 1 %
Lymphocytes Relative: 10 %
Lymphs Abs: 0.7 10*3/uL (ref 0.7–4.0)
MCH: 27.4 pg (ref 26.0–34.0)
MCHC: 31.1 g/dL (ref 30.0–36.0)
MCV: 87.9 fL (ref 80.0–100.0)
Monocytes Absolute: 0.7 10*3/uL (ref 0.1–1.0)
Monocytes Relative: 10 %
Neutro Abs: 5.3 10*3/uL (ref 1.7–7.7)
Neutrophils Relative %: 75 %
Platelets: 199 10*3/uL (ref 150–400)
RBC: 4.31 MIL/uL (ref 3.87–5.11)
RDW: 19.8 % — ABNORMAL HIGH (ref 11.5–15.5)
WBC: 7 10*3/uL (ref 4.0–10.5)
nRBC: 0 % (ref 0.0–0.2)

## 2024-03-14 LAB — COMPREHENSIVE METABOLIC PANEL WITH GFR
ALT: 19 U/L (ref 0–44)
AST: 25 U/L (ref 15–41)
Albumin: 3.6 g/dL (ref 3.5–5.0)
Alkaline Phosphatase: 64 U/L (ref 38–126)
Anion gap: 13 (ref 5–15)
BUN: 20 mg/dL (ref 8–23)
CO2: 20 mmol/L — ABNORMAL LOW (ref 22–32)
Calcium: 8.8 mg/dL — ABNORMAL LOW (ref 8.9–10.3)
Chloride: 102 mmol/L (ref 98–111)
Creatinine, Ser: 1.46 mg/dL — ABNORMAL HIGH (ref 0.44–1.00)
GFR, Estimated: 36 mL/min — ABNORMAL LOW (ref >60)
Glucose, Bld: 114 mg/dL — ABNORMAL HIGH (ref 70–99)
Potassium: 4 mmol/L (ref 3.5–5.1)
Sodium: 135 mmol/L (ref 135–145)
Total Bilirubin: 0.8 mg/dL (ref 0.0–1.2)
Total Protein: 7 g/dL (ref 6.5–8.1)

## 2024-03-14 LAB — PROTIME-INR
INR: 2.5 — ABNORMAL HIGH (ref 0.8–1.2)
Prothrombin Time: 27.5 s — ABNORMAL HIGH (ref 11.4–15.2)

## 2024-03-14 LAB — TROPONIN I (HIGH SENSITIVITY)
Troponin I (High Sensitivity): 11 ng/L (ref <18)
Troponin I (High Sensitivity): 10 ng/L (ref <18)

## 2024-03-14 LAB — BRAIN NATRIURETIC PEPTIDE: B Natriuretic Peptide: 503 pg/mL — ABNORMAL HIGH (ref 0.0–100.0)

## 2024-03-14 MED ORDER — FUROSEMIDE 10 MG/ML IJ SOLN
40.0000 mg | INTRAMUSCULAR | Status: AC
  Administered 2024-03-14: 40 mg via INTRAVENOUS
  Filled 2024-03-14: qty 4

## 2024-03-14 MED ORDER — ASPIRIN 81 MG PO CHEW
324.0000 mg | CHEWABLE_TABLET | Freq: Once | ORAL | Status: AC
  Administered 2024-03-14: 324 mg via ORAL
  Filled 2024-03-14: qty 4

## 2024-03-14 NOTE — ED Triage Notes (Signed)
 Chest tightness that started today and shortness of breath for the past 2 days.  Also reports low bp

## 2024-03-14 NOTE — Discharge Instructions (Addendum)
 If you find that you are gaining weight because of fluid retention take an extra dose of torsemide .  You will need to follow-up with your family doctor within 3 to 4 days, ER for severe or worsening symptoms

## 2024-03-14 NOTE — ED Provider Notes (Signed)
 [place] EMERGENCY DEPARTMENT AT [facility] Provider Note   CSN: [id number] Arrival date & time: 03/14/24  0957     Patient presents with: Shortness of Breath   [name] is a 78 y.o. female.    Shortness of Breath    This patient is a 78 year old female, she has no known history of coronary obstructive disease though she has had valvular disease and required both aortic valve and mitral valve replacement in the past.  She reports having a feeling of shortness of breath and her husband corroborates this stating that she has had a stroke and is not a good historian but that she had to get out of bed last night because she could not sleep very well laying down.  She had to go sleep on the couch where she could sit up, she does take torsemide  but has not increased her dose recently.  The husband helps control all of her medications because of her memory.  There has been no fevers vomiting or diarrhea, she denies any significant worsening of the swelling of her legs.  She does have mild chronic swelling of her legs but nothing is worsened, she has a slight cough but no fevers or chills, denies any chest pain to me.  No history of lung disease.  She does take amiodarone , Farxiga , losartan , metformin , torsemide  and warfarin as well as bisoprolol .  She has been prescribed inhaled bronchodilators in the past as well  The patient was last seen and admitted to the hospital with atrial flutter and atrial fibrillation approximately 3 weeks ago and required cardioversion by Dr. Amanda Jungling.  The last echocardiogram was performed in March 2025, it showed that the patient had an ejection fraction of 55%, there was grade 2 diastolic dysfunction and elevated left atrial pressure  Prior to Admission medications   Medication Sig Start Date End Date Taking? Authorizing Provider  acetaminophen  (TYLENOL ) 325 MG tablet Take 2 tablets (650 mg total) by mouth every 6 (six) hours as needed. 01/20/24   Yes Teddi Favors, DO  albuterol  (VENTOLIN  HFA) 108 (90 Base) MCG/ACT inhaler Inhale 2 puffs into the lungs every 6 (six) hours as needed for wheezing or shortness of breath. 08/17/23  Yes [provider]  amiodarone  (PACERONE ) 200 MG tablet Take 1 tablet (200 mg total) by mouth daily. 02/02/24  Yes Mealor, [name], MD  atorvastatin  (LIPITOR) 40 MG tablet TAKE ONE TABLET BY MOUTH ONCE DAILY. 07/24/23  Yes BranchJoyceann No, MD  azelastine  (ASTELIN ) 0.1 % nasal spray Place 1 spray into both nostrils 2 (two) times daily. Use in each nostril as directed 02/12/24  Yes Antonio Baumgarten, NP  bisoprolol  (ZEBETA ) 5 MG tablet Take 1 tablet (5 mg total) by mouth daily. 02/23/24  Yes Furth, Cadence H, PA-C  escitalopram  (LEXAPRO ) 10 MG tablet Take 10 mg by mouth daily with breakfast. 04/10/15  Yes [provider]  famotidine  (PEPCID ) 20 MG tablet TAKE ONE TABLET BY MOUTH EVERY DAY AFTER SUPPER 12/25/23  Yes Diamond Formica, MD  FARXIGA  10 MG TABS tablet Take 10 mg by mouth daily. 03/28/23  Yes [provider]  fexofenadine (ALLEGRA) 180 MG tablet Take 180 mg by mouth in the morning.   Yes [provider]  fluticasone  (FLONASE ) 50 MCG/ACT nasal spray Place 1 spray into both nostrils daily. 09/05/13  Yes BranchJoyceann No, MD  Fluticasone -Umeclidin-Vilant (TRELEGY ELLIPTA ) 100-62.5-25 MCG/ACT AEPB Inhale 1 puff into the lungs daily. 03/01/24  Yes Sueanne Emerald,  Winn Havens, NP  levothyroxine  (SYNTHROID ) 100 MCG tablet Take 100 mcg by mouth daily before breakfast.   Yes [provider]  losartan  (COZAAR ) 25 MG tablet Take 25 mg by mouth daily. 01/22/24  Yes [provider]  metFORMIN  (GLUCOPHAGE ) 500 MG tablet Take 1 tablet (500 mg total) by mouth 2 (two) times daily with a meal. 08/03/13  Yes Collins, Gina L, PA-C  Multiple Vitamin (MULITIVITAMIN WITH MINERALS) TABS Take 1 tablet by mouth daily.   Yes [provider]  pantoprazole  (PROTONIX ) 40 MG tablet TAKE 1  TABLET 30 TO 60 MINUTES BEFORE FIRST MEAL OF THE DAY. 10/30/23  Yes Diamond Formica, MD  Polyethyl Glycol-Propyl Glycol (SYSTANE OP) Place 1 drop into both eyes daily as needed (dry eyes).   Yes [provider]  potassium chloride  SA (KLOR-CON  M) 20 MEQ tablet Take 1 tablet (20 mEq total) by mouth daily. 11/14/22 03/14/24 Yes Rai, Ripudeep K, MD  promethazine -dextromethorphan  (PROMETHAZINE -DM) 6.25-15 MG/5ML syrup Take 5 mLs by mouth 4 (four) times daily as needed. 02/12/24  Yes Antonio Baumgarten, NP  torsemide  (DEMADEX ) 20 MG tablet Take 1 tablet (20 mg total) by mouth daily. (May take an additional 20mg  as needed for swelling) 12/04/23  Yes Branch, Joyceann No, MD  warfarin (COUMADIN ) 2.5 MG tablet TAKE AS DIRECTED BY COUMADIN  CLINIC. Patient taking differently: Take 2.5 mg by mouth See admin instructions. TAKE AS DIRECTED BY COUMADIN  CLINIC. Take 1 tablet Monday-Saturday and on Sunday 1/2 tablet. 11/20/23  Yes BranchJoyceann No, MD    Allergies: Amiodarone     Review of Systems  Respiratory:  Positive for shortness of breath.   All other systems reviewed and are negative.   Updated Vital Signs BP 136/64   Pulse (!) 59   Temp 97.9 F (36.6 C) (Oral)   Resp 13   Ht 1.575 m (5' 2)   Wt 72 kg   SpO2 96%   BMI 29.03 kg/m   Physical Exam Vitals and nursing note reviewed.  Constitutional:      General: She is not in acute distress.    Appearance: She is well-developed.  HENT:     Head: Normocephalic and atraumatic.     Mouth/Throat:     Pharynx: No oropharyngeal exudate.   Eyes:     General: No scleral icterus.       Right eye: No discharge.        Left eye: No discharge.     Conjunctiva/sclera: Conjunctivae normal.     Pupils: Pupils are equal, round, and reactive to light.   Neck:     Thyroid : No thyromegaly.     Vascular: No JVD.   Cardiovascular:     Rate and Rhythm: Normal rate and regular rhythm.     Heart sounds: Murmur heard.     No friction rub. No gallop.      Comments: Soft systolic murmur present Pulmonary:     Effort: Pulmonary effort is normal. No respiratory distress.     Breath sounds: Normal breath sounds. No wheezing or rales.  Abdominal:     General: Bowel sounds are normal. There is no distension.     Palpations: Abdomen is soft. There is no mass.     Tenderness: There is no abdominal tenderness.   Musculoskeletal:        General: No tenderness. Normal range of motion.     Cervical back: Normal range of motion and neck supple.     Right lower leg:  Edema present.     Left lower leg: Edema present.     Comments: Subtle bilateral lower extremity edema  Lymphadenopathy:     Cervical: No cervical adenopathy.   Skin:    General: Skin is warm and dry.     Findings: No erythema or rash.   Neurological:     Mental Status: She is alert.     Coordination: Coordination normal.   Psychiatric:        Behavior: Behavior normal.     (all labs ordered are listed, but only abnormal results are displayed) Labs Reviewed  CBC WITH DIFFERENTIAL/PLATELET - Abnormal; Notable for the following components:      Result Value   Hemoglobin 11.8 (*)    RDW 19.8 (*)    All other components within normal limits  COMPREHENSIVE METABOLIC PANEL WITH GFR - Abnormal; Notable for the following components:   CO2 20 (*)    Glucose, Bld 114 (*)    Creatinine, Ser 1.46 (*)    Calcium  8.8 (*)    GFR, Estimated 36 (*)    All other components within normal limits  BRAIN NATRIURETIC PEPTIDE - Abnormal; Notable for the following components:   B Natriuretic Peptide 503.0 (*)    All other components within normal limits  PROTIME-INR - Abnormal; Notable for the following components:   Prothrombin Time 27.5 (*)    INR 2.5 (*)    All other components within normal limits  TROPONIN I (HIGH SENSITIVITY)  TROPONIN I (HIGH SENSITIVITY)    EKG: EKG Interpretation Date/Time:  Thursday March 14 2024 10:12:27 EDT Ventricular Rate:  65 PR Interval:  341 QRS  Duration:  97 QT Interval:  492 QTC Calculation: 512 R Axis:   150  Text Interpretation: Sinus rhythm Prolonged PR interval Low voltage, precordial leads Probable right ventricular hypertrophy Prolonged QT interval since last tracing no significant change Confirmed by Early Glisson ([id number]) on 03/14/2024 10:16:38 AM  Radiology: Lenell Query Chest Port 1 View Result Date: 03/14/2024 CLINICAL DATA:  sob EXAM: PORTABLE CHEST - 1 VIEW COMPARISON:  January 25, 2024 FINDINGS: Chronic coarsening of the pulmonary interstitium without overt edema. No focal airspace consolidation or pneumothorax. Trace amount of chronic, loculated right pleural effusion along the lateral right hemithorax. Unchanged moderate cardiomegaly. Aortic and mitral valve replacement. No acute fracture or destructive lesion. IMPRESSION: Unchanged cardiomegaly and trace amount of loculated right pleural effusion. Electronically Signed   By: Rance Burrows M.D.   On: 03/14/2024 10:56     Procedures   Medications Ordered in the ED  aspirin  chewable tablet 324 mg (324 mg Oral Given 03/14/24 1041)  furosemide  (LASIX ) injection 40 mg (40 mg Intravenous Given 03/14/24 1154)                                    Medical Decision Making Amount and/or Complexity of Data Reviewed Labs: ordered. Radiology: ordered.  Risk OTC drugs. Prescription drug management.    This patient presents to the ED for concern of shortness of breath, this involves an extensive number of treatment options, and is a complaint that carries with it a high risk of complications and morbidity.  The differential diagnosis includes failure exacerbation, pneumonia, pneumothorax, seems unlikely to be pulmonary embolism given that the patient takes warfarin   Co morbidities / Chronic conditions that complicate the patient evaluation  Chronic A-fib a flutter and valves   Additional history obtained:  Additional history obtained from EMR External records from outside  source obtained and reviewed including electronic medical record, see history above   Lab Tests:  I Ordered, and personally interpreted labs.  The pertinent results include: BNP elevated at around 500, troponin negative x 2, renal function better than usual, INR in the therapeutic range   Imaging Studies ordered:  I ordered imaging studies including chest x-ray I independently visualized and interpreted imaging which showed unchanged cardiomegaly and trace effusion I agree with the radiologist interpretation   Cardiac Monitoring: / EKG:  The patient was maintained on a cardiac monitor.  I personally viewed and interpreted the cardiac monitored which showed an underlying rhythm of: Sinus rhythm   Problem List / ED Course / Critical interventions / Medication management  Patient has done very well, given IV furosemide  I ordered medication including furosemide  Reevaluation of the patient after these medicines showed that the patient improved, diurese successfully, no oxygen  required in the ED I have reviewed the patients home medicines and have made adjustments as needed   Social Determinants of Health:  Known congestive heart failure   Test / Admission - Considered:  Stable for discharge      Final diagnoses:  Shortness of breath    ED Discharge Orders     None          Early Glisson, MD 03/14/24 1418

## 2024-03-14 NOTE — ED Notes (Signed)
 Pt placed in Gown and on cardiac monitoring. Husband at bedside.

## 2024-03-15 ENCOUNTER — Other Ambulatory Visit: Payer: Self-pay | Admitting: Internal Medicine

## 2024-03-15 ENCOUNTER — Ambulatory Visit (HOSPITAL_COMMUNITY)
Admission: RE | Admit: 2024-03-15 | Discharge: 2024-03-15 | Disposition: A | Discharge status: Home / Self Care | Source: Ambulatory Visit | Attending: Internal Medicine | Admitting: Internal Medicine

## 2024-03-15 VITALS — BP 102/50 | HR 64 | Ht 62.0 in | Wt 160.2 lb

## 2024-03-15 DIAGNOSIS — D6869 Other thrombophilia: Secondary | ICD-10-CM | POA: Diagnosis not present

## 2024-03-15 DIAGNOSIS — I4891 Unspecified atrial fibrillation: Secondary | ICD-10-CM

## 2024-03-15 DIAGNOSIS — I4892 Unspecified atrial flutter: Secondary | ICD-10-CM

## 2024-03-15 DIAGNOSIS — Z5181 Encounter for therapeutic drug level monitoring: Secondary | ICD-10-CM | POA: Diagnosis not present

## 2024-03-15 DIAGNOSIS — Z79899 Other long term (current) drug therapy: Secondary | ICD-10-CM | POA: Diagnosis not present

## 2024-03-15 DIAGNOSIS — I4811 Longstanding persistent atrial fibrillation: Secondary | ICD-10-CM | POA: Diagnosis not present

## 2024-03-15 DIAGNOSIS — I480 Paroxysmal atrial fibrillation: Secondary | ICD-10-CM

## 2024-03-15 NOTE — Progress Notes (Signed)
 Primary Care Physician: Sara Bile, MD Primary Cardiologist: Sara Lander, MD Electrophysiologist: Sara Grange, MD     Referring Physician: ED     [name] is a 78 y.o. female with a history of HTN, chronic HFpEF, aortic atherosclerosis by CT imaging, T2DM, HLD, CVA, rheumatic heart disease s/p AVR and MVR, and atrial fibrillation/flutter who presents for consultation in the [facility]. Seen by Dr. Arlester [name] on 5/2 noted to be in atrial flutter. Patient begun on amiodarone  load and underwent successful DCCV on 02/23/24 with subsequent reduction in bisoprolol  to 5 mg daily. Patient is on coumadin  for a CHADS2VASC score of 9.  On evaluation today, she is currently in NSR. She went to ED yesterday for SOB and received IV lasix  with noted improvement. She is taking amiodarone  200 mg daily. She is on coumadin . She feels better after yesterday's ED visit.  Today, she denies symptoms of palpitations, chest pain, shortness of breath, orthopnea, PND, lower extremity edema, dizziness, presyncope, syncope, snoring, daytime somnolence, bleeding, or neurologic sequela. The patient is tolerating medications without difficulties and is otherwise without complaint today.    she has a BMI of Body mass index is 29.3 kg/m.[name] Filed Weights   03/15/24 1049  Weight: 72.7 kg    Current Outpatient Medications  Medication Sig Dispense Refill   acetaminophen  (TYLENOL ) 325 MG tablet Take 2 tablets (650 mg total) by mouth every 6 (six) hours as needed. (Patient taking differently: Take 650 mg by mouth as needed.) 36 tablet 0   albuterol  (VENTOLIN  HFA) 108 (90 Base) MCG/ACT inhaler Inhale 2 puffs into the lungs every 6 (six) hours as needed for wheezing or shortness of breath.     amiodarone  (PACERONE ) 200 MG tablet Take 1 tablet (200 mg total) by mouth daily. 30 tablet 6   atorvastatin  (LIPITOR) 40 MG tablet TAKE ONE TABLET BY MOUTH ONCE DAILY. 90 tablet 3   azelastine   (ASTELIN ) 0.1 % nasal spray Place 1 spray into both nostrils 2 (two) times daily. Use in each nostril as directed 30 mL 1   bisoprolol  (ZEBETA ) 5 MG tablet Take 1 tablet (5 mg total) by mouth daily. 90 tablet 3   escitalopram  (LEXAPRO ) 10 MG tablet Take 10 mg by mouth daily with breakfast.     famotidine  (PEPCID ) 20 MG tablet TAKE ONE TABLET BY MOUTH EVERY DAY AFTER SUPPER 30 tablet 0   FARXIGA  10 MG TABS tablet Take 10 mg by mouth daily.     fexofenadine (ALLEGRA) 180 MG tablet Take 180 mg by mouth in the morning.     fluticasone  (FLONASE ) 50 MCG/ACT nasal spray Place 1 spray into both nostrils daily.     Fluticasone -Umeclidin-Vilant (TRELEGY ELLIPTA ) 100-62.5-25 MCG/ACT AEPB Inhale 1 puff into the lungs daily. 84 each 3   levothyroxine  (SYNTHROID ) 100 MCG tablet Take 100 mcg by mouth daily before breakfast.     losartan  (COZAAR ) 25 MG tablet Take 25 mg by mouth daily.     metFORMIN  (GLUCOPHAGE ) 500 MG tablet Take 1 tablet (500 mg total) by mouth 2 (two) times daily with a meal. 60 tablet 1   Multiple Vitamin (MULITIVITAMIN WITH MINERALS) TABS Take 1 tablet by mouth daily.     pantoprazole  (PROTONIX ) 40 MG tablet TAKE 1 TABLET 30 TO 60 MINUTES BEFORE FIRST MEAL OF THE DAY. 90 tablet 2   Polyethyl Glycol-Propyl Glycol (SYSTANE OP) Place 1 drop into both eyes daily as needed (dry eyes).     potassium  chloride SA (KLOR-CON  M) 20 MEQ tablet Take 1 tablet (20 mEq total) by mouth daily. 30 tablet 0   promethazine -dextromethorphan  (PROMETHAZINE -DM) 6.25-15 MG/5ML syrup Take 5 mLs by mouth 4 (four) times daily as needed. 240 mL 0   torsemide  (DEMADEX ) 20 MG tablet Take 1 tablet (20 mg total) by mouth daily. (May take an additional 20mg  as needed for swelling) 45 tablet 6   warfarin (COUMADIN ) 2.5 MG tablet TAKE AS DIRECTED BY COUMADIN  CLINIC. 40 tablet 3   No current facility-administered medications for this encounter.    Atrial Fibrillation Management history:  Previous antiarrhythmic drugs:  amiodarone  Previous cardioversions: 07/26/13, 01/04/23, 02/23/24 Previous ablations: none Anticoagulation history: coumadin    ROS- All systems are reviewed and negative except as per the HPI above.  Physical Exam: BP (!) 102/50   Pulse 64   Ht 5' 2 (1.575 m)   Wt 72.7 kg   BMI 29.30 kg/m   GEN: Well nourished, well developed in no acute distress NECK: No JVD; No carotid bruits CARDIAC: Regular rate and rhythm, no murmurs, rubs, gallops RESPIRATORY:  Clear to auscultation without rales, wheezing or rhonchi  ABDOMEN: Soft, non-tender, non-distended EXTREMITIES:  No edema; No deformity   EKG today demonstrates  Vent. rate 64 BPM PR interval 330 ms QRS duration 88 ms QT/QTcB 462/476 ms P-R-T axes 61 131 98 Sinus rhythm with 1st degree A-V block Right axis deviation Nonspecific T wave abnormality Prolonged QT Abnormal ECG When compared with ECG of 14-Mar-2024 10:12, PREVIOUS ECG IS PRESENT  Echo 12/18/23 demonstrated   1. Septal hypokinesis. . Left ventricular ejection fraction, by  estimation, is 55%. Left ventricular diastolic parameters are consistent  with Grade II diastolic dysfunction (pseudonormalization). Elevated left  atrial pressure.   2. Right ventricular systolic function is moderately reduced. The right  ventricular size is mildly enlarged.   3. Left atrial size was mild to moderately dilated.   4. Right atrial size was mildly dilated.   5. S/p MVR (25 mm Edwars Magna-ease prosthesis; procedure date 07/18/13)  Mean gradient through the valve is 6 mm Hg. No significant change from  echo in Feb 2024. The mitral valve has been repaired/replaced. No evidence  of mitral valve regurgitation.   6. S/p AVR (21 mm Edwaards Magna-ease prosthesis; procedure dated  07/18/13) Peak and man gradients through the valve are 12 and 8 mm Hg  respectively Dimensionless index is 0.37 (normal) Compared to echo report  from 2024, no significant change in mean  gradient. . The  aortic valve has been repaired/replaced. Aortic valve  regurgitation is not visualized.   ASSESSMENT & PLAN CHA2DS2-VASc Score = 9  The patient's score is based upon: CHF History: 1 HTN History: 1 Diabetes History: 1 Stroke History: 2 Vascular Disease History: 1 Age Score: 2 Gender Score: 1       ASSESSMENT AND PLAN: Longstanding Persistent Atrial Fibrillation (ICD10:  I48.11) The patient's CHA2DS2-VASc score is 9, indicating a 12.2% annual risk of stroke.    She is currently in NSR. She is feeling better today since ED visit yesterday s/p IV lasix . We had a discussion regarding potential adverse effects of amiodarone  and necessity for routine monitoring. After discussion, patient will continue on amiodarone  and agrees with surveillance.  High risk medication monitoring (ICD10: J342684) Patient requires ongoing monitoring for anti-arrhythmic medication which has the potential to cause life threatening arrhythmias or AV block. Qtc stable. Continue amiodarone  200 mg daily. Cmet on 6/12 and TSH on 3/11 stable.  Secondary  Hypercoagulable State (ICD10:  D68.69) The patient is at significant risk for stroke/thromboembolism based upon her CHA2DS2-VASc Score of 9.  Continue Warfarin (Coumadin ).     Follow up 6 months for amiodarone  surveillance.   Minnie Amber, PA-C  [facility] [address] [place], [place] [address] [phone]

## 2024-03-27 ENCOUNTER — Ambulatory Visit: Attending: Cardiology | Admitting: *Deleted

## 2024-03-27 DIAGNOSIS — Z952 Presence of prosthetic heart valve: Secondary | ICD-10-CM | POA: Diagnosis not present

## 2024-03-27 DIAGNOSIS — Z5181 Encounter for therapeutic drug level monitoring: Secondary | ICD-10-CM | POA: Diagnosis not present

## 2024-03-27 LAB — POCT INR: INR: 4.9 — AB (ref 2.0–3.0)

## 2024-03-27 NOTE — Patient Instructions (Signed)
 S/P DCCV 02/23/24   Hold warfarin tonight, take 1/2 tablet tomorrow night then resume 1 tablet daily except 1/2 tablet on Sundays Pt is holding off on colonoscopy procedure for now Amiodarone  200mg  decreased to 1 tablet daily on 02/15/24

## 2024-03-27 NOTE — Progress Notes (Signed)
Please see anticoagulation encounter.

## 2024-04-08 ENCOUNTER — Ambulatory Visit: Attending: Cardiology | Admitting: *Deleted

## 2024-04-08 DIAGNOSIS — Z952 Presence of prosthetic heart valve: Secondary | ICD-10-CM

## 2024-04-08 DIAGNOSIS — Z5181 Encounter for therapeutic drug level monitoring: Secondary | ICD-10-CM | POA: Diagnosis not present

## 2024-04-08 LAB — POCT INR: INR: 3.5 — AB (ref 2.0–3.0)

## 2024-04-08 NOTE — Patient Instructions (Addendum)
 Decrease warfarin to 1 tablet daily except 1/2 tablet on Sundays and Thursdays Eat extra greens/salad tonight Pt is holding off on colonoscopy procedure for now Amiodarone  200mg  decreased to 1 tablet daily on 02/15/24

## 2024-04-08 NOTE — Progress Notes (Signed)
Please see anticoagulation encounter.

## 2024-04-12 ENCOUNTER — Encounter: Payer: Self-pay | Admitting: Cardiology

## 2024-04-12 ENCOUNTER — Ambulatory Visit: Attending: Cardiology | Admitting: Cardiology

## 2024-04-12 VITALS — BP 132/60 | HR 61 | Ht 63.0 in | Wt 158.8 lb

## 2024-04-12 DIAGNOSIS — I5032 Chronic diastolic (congestive) heart failure: Secondary | ICD-10-CM | POA: Diagnosis not present

## 2024-04-12 DIAGNOSIS — I4891 Unspecified atrial fibrillation: Secondary | ICD-10-CM

## 2024-04-12 DIAGNOSIS — Z79899 Other long term (current) drug therapy: Secondary | ICD-10-CM | POA: Diagnosis not present

## 2024-04-12 DIAGNOSIS — R809 Proteinuria, unspecified: Secondary | ICD-10-CM | POA: Diagnosis not present

## 2024-04-12 DIAGNOSIS — E785 Hyperlipidemia, unspecified: Secondary | ICD-10-CM | POA: Diagnosis not present

## 2024-04-12 DIAGNOSIS — I10 Essential (primary) hypertension: Secondary | ICD-10-CM

## 2024-04-12 DIAGNOSIS — I4892 Unspecified atrial flutter: Secondary | ICD-10-CM | POA: Diagnosis not present

## 2024-04-12 MED ORDER — TORSEMIDE 20 MG PO TABS: ORAL_TABLET | ORAL | 1 refills | Status: DC

## 2024-04-12 NOTE — Progress Notes (Signed)
 Clinical Summary [name] is a 78 y.o.female seen today for follow up of the following medical problems.   1. Valvular heart disease   - 07/18/13 patient underwent MVR with 25 mm Edwards Magna-ease pericardial valve and also AVR with 21 mm Edwards Magna-ease pericardial valve    -03/2022 echo: LVEF 60%, indet diastolic, normal MVR and AVR. 12/2023 echo: LVEF 55%, grade II dd, mod RV dysfunction, normal MVR and AVR     2.Chronic HFpEF  11/2022 echo: LVEF 55-60%, indet diastolic fxn, mod pulm HTN, normal MVR and AVR 12/2023 echo: LVEF 55%, grade II dd, mod RV dysfunction, normal MVR and AVR  - sigifnicant SOB/DOE since Christmas - significant increase DOE. Up until walking 1/3 mile comfortable, now DOE just walking to mail box - some chest tightness at times. Midchest, 5/10 in severity. Not positional. Can last few hours constant. - mild increase in LE edema, taking extra torsemide .  - increased cough, no wheezing. Nonproductive cough - home weights 167 lbs.  - palpitations at times with activity, not at reast.    12/31/23 ER visit with SOB - BNP 300s, CXR with cardiomegaly and pulm vascular congestion, mild edema - given IV lasix  with improved symptoms in ER, discharged - no recent edema - compliant with meds - home weights 159 lbs stable. Down from 167 last visit - takes torsemide  20mg  daily. Has had uptrend in Cr with most recent pcp labs, Cr up to 1.4   - some LE edema at home at her baseline - home weights 155 lbs.  - ongoing SOB/DOE.     3. Afib/aflutter - hx of cardioembolic CVA, has been on longterm anticoag - DCCV 01/04/23 - from Dr Marko notes if recurent aflutter consider amiodarone . -  -discussed EKG with EP last visit, thought to be atypical flutter or atach.  - EKG today looks more evidence suspecting atypical flutter - she reports ongoing palpitations, SOB with activities.      -started on amio EP  02/23/24 DCCV to SR  - after conversion bisoprolol   lowered from 15mg  to 5mg  due to low HRs - 6/12 ER visit with SOB, improved with IV lasix   EKG today shows remains in SR, first degree AV block - no recent palpitaitons - compliant with meds. No bleeding on coumadin        Other medical problems not addressed this visit   3. HTN - she is compliantw ith meds     4. Hyperlipidemia - recent labs with pcp, compliant with stin   06/2021 TC 185 TG 75 HDL 66 LDL 104 01/2022 TC 177 TG 133 HDL 58 LDL 96   5. Carotid stenosis - 07/2013 US  with mild bilateral stenosis 2018 mild disease - 01/2021 US  mild bilateral disease - 11/2022 mild bilateral disease        6. Obstructive lung disease 2014 PFTs moderate obstruction   - some cough, can be productive. Some wheezing at times.    7. Syncope -admit 11/2022, from notes intense shoulder pain which triggered syncope suggesting vasovagal etiology. Had low bp and HRs Past Medical History:  Diagnosis Date   Anemia    Asthma    Atrial fibrillation (HCC)    Breast carcinoma (HCC) 1995   1995   Cardioembolic stroke (HCC) 01/2012   Right frontal in 01/2012; normal carotid ultrasound; possible LAA thrombus by TEE; virtual complete neurologic recovery   Chronic kidney disease, stage 2, mildly decreased GFR    GFR of approximately  60   Diabetes mellitus without complication (HCC)    Diverticulosis of colon (without mention of hemorrhage) 2012   Dr. Golda   Dysrhythmia    Fasting hyperglycemia    120 fasting   Gastroesophageal reflux disease    Gastroparesis    Hemorrhoids    Hyperlipidemia    Hypertension    pt denies 05/30/13     Dr Alvan chester   Hyponatremia    Hypothyroidism    Rheumatic heart disease    a. 07/2013 Echo: Ef 55-60%, Mod AS/AI, Mod-Sev MS, sev dil LA, PASP 58;  b. 07/2013 TEE: EF 35-40%, mild-mod AS/AI, mod-sev MS, LA smoke;  c. 07/2013 Cath: elev R heart pressures, nl cors.   Shortness of breath      No Known Allergies   Current Outpatient Medications   Medication Sig Dispense Refill   acetaminophen  (TYLENOL ) 325 MG tablet Take 2 tablets (650 mg total) by mouth every 6 (six) hours as needed. (Patient taking differently: Take 650 mg by mouth as needed.) 36 tablet 0   albuterol  (VENTOLIN  HFA) 108 (90 Base) MCG/ACT inhaler Inhale 2 puffs into the lungs every 6 (six) hours as needed for wheezing or shortness of breath.     amiodarone  (PACERONE ) 200 MG tablet Take 1 tablet (200 mg total) by mouth daily. 30 tablet 6   atorvastatin  (LIPITOR) 40 MG tablet TAKE ONE TABLET BY MOUTH ONCE DAILY. 90 tablet 3   azelastine  (ASTELIN ) 0.1 % nasal spray Place 1 spray into both nostrils 2 (two) times daily. Use in each nostril as directed 30 mL 1   bisoprolol  (ZEBETA ) 5 MG tablet Take 1 tablet (5 mg total) by mouth daily. 90 tablet 3   escitalopram  (LEXAPRO ) 10 MG tablet Take 10 mg by mouth daily with breakfast.     famotidine  (PEPCID ) 20 MG tablet TAKE ONE TABLET BY MOUTH EVERY DAY AFTER SUPPER 30 tablet 0   FARXIGA  10 MG TABS tablet Take 10 mg by mouth daily.     fexofenadine (ALLEGRA) 180 MG tablet Take 180 mg by mouth in the morning.     fluticasone  (FLONASE ) 50 MCG/ACT nasal spray Place 1 spray into both nostrils daily.     Fluticasone -Umeclidin-Vilant (TRELEGY ELLIPTA ) 100-62.5-25 MCG/ACT AEPB Inhale 1 puff into the lungs daily. 84 each 3   levothyroxine  (SYNTHROID ) 100 MCG tablet Take 100 mcg by mouth daily before breakfast.     losartan  (COZAAR ) 25 MG tablet Take 25 mg by mouth daily.     metFORMIN  (GLUCOPHAGE ) 500 MG tablet Take 1 tablet (500 mg total) by mouth 2 (two) times daily with a meal. 60 tablet 1   Multiple Vitamin (MULITIVITAMIN WITH MINERALS) TABS Take 1 tablet by mouth daily.     pantoprazole  (PROTONIX ) 40 MG tablet TAKE 1 TABLET 30 TO 60 MINUTES BEFORE FIRST MEAL OF THE DAY. 90 tablet 2   Polyethyl Glycol-Propyl Glycol (SYSTANE OP) Place 1 drop into both eyes daily as needed (dry eyes).     potassium chloride  SA (KLOR-CON  M) 20 MEQ tablet  Take 1 tablet (20 mEq total) by mouth daily. 30 tablet 0   promethazine -dextromethorphan  (PROMETHAZINE -DM) 6.25-15 MG/5ML syrup Take 5 mLs by mouth 4 (four) times daily as needed. 240 mL 0   torsemide  (DEMADEX ) 20 MG tablet Take 1 tablet (20 mg total) by mouth daily. (May take an additional 20mg  as needed for swelling) 45 tablet 6   warfarin (COUMADIN ) 2.5 MG tablet TAKE AS DIRECTED BY COUMADIN  CLINIC. 40 tablet 3  No current facility-administered medications for this visit.     Past Surgical History:  Procedure Laterality Date   AORTIC VALVE REPLACEMENT N/A 07/18/2013   Procedure: AORTIC VALVE REPLACEMENT (AVR);  Surgeon: Dorise MARLA Fellers, MD;  Location[facility] OR;  Service: Open Heart Surgery;  Laterality: N/A;   BACK SURGERY     BREAST LUMPECTOMY Right 1995   CARDIAC CATHETERIZATION     CARDIOVERSION N/A 07/26/2013   Procedure: CARDIOVERSION;  Surgeon: Vina LULLA Gull, MD;  Location: [facility] ENDOSCOPY;  Service: Cardiovascular;  Laterality: N/A;   CARDIOVERSION N/A 01/04/2023   Procedure: CARDIOVERSION;  Surgeon: Hobart Powell BRAVO, MD;  Location: [facility] ENDOSCOPY;  Service: Cardiovascular;  Laterality: N/A;   CARDIOVERSION N/A 02/23/2024   Procedure: CARDIOVERSION;  Surgeon: Alvan Sara FALCON, MD;  Location: [facility] ORS;  Service: Endoscopy;  Laterality: N/A;   COLONOSCOPY  2012   Negative screening procedure   DILATION AND CURETTAGE OF UTERUS     ESOPHAGEAL MANOMETRY N/A 06/17/2013   Procedure: ESOPHAGEAL MANOMETRY (EM);  Surgeon: Alm JONELLE Gander, MD;  Location: [facility] ENDOSCOPY;  Service: Endoscopy;  Laterality: N/A;   INTRAOPERATIVE TRANSESOPHAGEAL ECHOCARDIOGRAM N/A 07/18/2013   Procedure: INTRAOPERATIVE TRANSESOPHAGEAL ECHOCARDIOGRAM;  Surgeon: Dorise MARLA Fellers, MD;  Location: [facility] OR;  Service: Open Heart Surgery;  Laterality: N/A;   LEFT AND RIGHT HEART CATHETERIZATION WITH CORONARY ANGIOGRAM N/A 07/10/2013   Procedure: LEFT AND RIGHT HEART CATHETERIZATION WITH CORONARY ANGIOGRAM;  Surgeon: Lonni JONETTA Cash, MD;  Location: [facility] CATH LAB;  Service: Cardiovascular;  Laterality: N/A;   LUMBAR LAMINECTOMY/DECOMPRESSION MICRODISCECTOMY Right 05/13/2014   Procedure: LUMBAR LAMINECTOMY/DECOMPRESSION MICRODISCECTOMY 1 LEVEL  lumbar four/five;  Surgeon: Darina MALVA Boehringer, MD;  Location: [facility] NEURO ORS;  Service: Neurosurgery;  Laterality: Right;   MITRAL VALVE REPLACEMENT N/A 07/18/2013   Procedure: MITRAL VALVE (MV) REPLACEMENT;  Surgeon: Dorise MARLA Fellers, MD;  Location: [facility] OR;  Service: Open Heart Surgery;  Laterality: N/A;   MITRAL VALVE SURGERY  [facility] closed mitral valvulotomy by finger fracture   TEE WITHOUT CARDIOVERSION  01/24/2012   Procedure: TRANSESOPHAGEAL ECHOCARDIOGRAM (TEE);  Surgeon: Redell GORMAN Shallow, MD;  Location: [facility] ENDOSCOPY;  Service: Cardiovascular;  Laterality: N/A;   TEE WITHOUT CARDIOVERSION N/A 07/11/2013   Procedure: TRANSESOPHAGEAL ECHOCARDIOGRAM (TEE);  Surgeon: Aleene JINNY Passe, MD;  Location: [facility] ENDOSCOPY;  Service: Cardiovascular;  Laterality: N/A;   TEE WITHOUT CARDIOVERSION N/A 07/26/2013   Procedure: TRANSESOPHAGEAL ECHOCARDIOGRAM (TEE);  Surgeon: Vina LULLA Gull, MD;  Location: [facility] ENDOSCOPY;  Service: Cardiovascular;  Laterality: N/A;   TUBAL LIGATION  1973     No Known Allergies    Family History  Problem Relation Age of Onset   Heart disease Brother 66       MI   Rheum arthritis Maternal Grandmother    Asthma Maternal Grandfather    Hypothyroidism Mother    Diabetes Sister    Cirrhosis Father      Social History Ms. Maxfield reports that she has never smoked. She has never used smokeless tobacco. Ms. Haymaker reports no history of alcohol use.    Physical Examination Today's Vitals   04/12/24 0955  BP: 132/60  Pulse: 61  SpO2: 95%  Weight: 158 lb 12.8 oz (72 kg)  Height: 5' 3 (1.6 m)   Body mass index is 28.13 kg/m.  Gen: resting comfortably, no acute distress HEENT: no scleral icterus, pupils equal round and reactive, no palptable  cervical adenopathy,  CV: RRR, 2/6 systolic murmur rusb Resp: Clear to auscultation bilaterally  GI: abdomen is soft, non-tender, non-distended, normal bowel sounds, no hepatosplenomegaly MSK: extremities are warm, no edema.  Skin: warm, no rash Neuro:  no focal deficits Psych: appropriate affect   Diagnostic Studies  07/2013 TTE Study Conclusions  - Study data: Technically difficult study. - Left ventricle: The cavity size was normal. Wall thickness   was normal. Systolic function was normal. The estimated   ejection fraction was in the range of 55% to 60%.   Indeterminate diastolic function. There is evidence of   very high left atrial pressures (E/e' 63) - Aortic valve: Trileaflet; mildly thickened leaflets.   Accurate evaluation of aortic stenosis limited in the   setting of severe mitral stenosis and decreased   ventricular loadin. Morphologically there does not appear   to be significant stenosis. By gradient (mean PG 21 mmHg)   the stenosis is moderate, by AVA VTI (0.8) stenosis is   severe. Moderate regurgitation, AR PHT is 439 cm/s. Valve   area: 0.78cm^2(VTI). Valve area: 0.78cm^2 (Vmax). - Mitral valve: The findings are consistent with moderate to   severe stenosis. (Mean PG 12 mmHg, MVA by PHT 1.3. Cannot   use MVA by VTI due to moderate AI. Valve area by pressure   half-time: 1.38cm^2. - Left atrium: The atrium was severely dilated. - Right ventricle: Systolic function was mildly reduced, RV   TAPSE is 1.4, TV systolic annular tissue velocity 10 cm/s. - Pulmonary arteries: Systolic pressure was moderately   increased. PA peak pressure: 58mm Hg (S) (PASP based on a   fairly faint spectral Doppler waveform)     07/2013 TEE Study Conclusions  - Left ventricle: Systolic function was moderately reduced.   The estimated ejection fraction was in the range of 35% to   40%. - Aortic valve: There was mild to moderate stenosis. Mild to   moderate regurgitation. -  Mitral valve: The findings are consistent with moderate to   severe stenosis. Valve area by pressure half-time:   1.45cm^2. - Left atrium: There was spontaneous echo contrast   (smoke). - Atrial septum: No defect or patent foramen ovale was   identified. - Tricuspid valve: No evidence of vegetation. Transesophageal echocardiography.  2D and color Doppler. Height:  Height: 157.5cm. Height: 62in.  Weight:  Weight: 75kg. Weight: 165lb.  Body mass index:  BMI: 30.2kg/m^2. Body surface area:    BSA: 1.61m^2.  Blood pressure: 143/56.  Patient status:  Inpatient.  Location:  Endoscopy.      06/2018 echo Study Conclusions   - Left ventricle: The cavity size was normal. Wall thickness was   normal. Systolic function was normal. The estimated ejection   fraction was in the range of 60% to 65%. - Aortic valve: AV prosthesis is difficult to see Peak and mean   gradients through the valve are 18 and 10 mm Hg respectively. - Mitral valve: Peak and mean gradients through the valve   prosthesis are 19 and 4 mm Hg respectively MVA by P T1/2 is 3   cm2. s/p MV prosthesis. Valve prosthesis is difficult to see. - Pulmonary arteries: PA peak pressure: 48 mm Hg (S).     04/2020 echo IMPRESSIONS     1. Left ventricular ejection fraction, by estimation, is 60 to 65%. The  left ventricle has normal function. The left ventricle has no regional  wall motion abnormalities. Left ventricular diastolic parameters are  indeterminate.   2. Right ventricular systolic function is low normal. The right  ventricular size is normal.  There is moderately elevated pulmonary artery  systolic pressure. The estimated right ventricular systolic pressure is  45.5 mmHg.   3. Left atrial size was mildly dilated.   4. The mitral valve has been repaired/replaced. No evidence of mitral  valve regurgitation. The mean mitral valve gradient is 5.0 mmHg. There is  a 25 mm Magna Ease bioprosthetic valve present in the mitral  position.  Grossly normal function.   5. The aortic valve has been repaired/replaced. Aortic valve  regurgitation is not visualized. There is a 21 mm Magna Ease bioprosthetic  valve present in the aortic position. Aortic valve mean gradient measures  7.0 mmHg. Grossly normal function.   6. The inferior vena cava is normal in size with greater than 50%  respiratory variability, suggesting right atrial pressure of 3 mmHg.     07/2013 Cath Impression: 1. No angiographic evidence of CAD 2. Elevated filling pressures     01/2021 nuclear stress   Atrial fibrillation present throughout. No diagnostic ST segment changes to indicate ischemia. Small, mild to moderate intensity, mid to apical anterior defect. This is fixed with the exception of a small degree of partial reversibility in the mid zone. Suggestive of scar versus variable soft tissue attenuation. This is a low risk study. Nuclear stress EF: 50%.   01/2021 carotid US  Summary:  Right Carotid: Velocities in the right ICA are consistent with a 1-39%  stenosis.   Left Carotid: Velocities in the left ICA are consistent with a 1-39%  stenosis.   Vertebrals:  Bilateral vertebral arteries demonstrate antegrade flow.  Subclavians: Normal flow hemodynamics were seen in bilateral subclavian               arteries.      Assessment and Plan  1. Chronic diastolic HF - some ongoign SOB/DOE. Recent ER visit with SOB, imrpoved with IV lasix . - difficult balance with diuretic and her kidney function - try increasing torsemide  to 20mg  alternating days with 40mg . Check bmet/mg in 2 weeks.May have to accept somewhat higher Cr if her breathing improves with additional diuresis.     2. Afib/aflutter - maintaining SR since her DCCV, remains on amiodaorne - no palpitations, was hoping breathing would improve more with restoration of SR, however ongoing may be more related to volume status - continue current meds   3.Valvular heart disease -  overall normal MVR and AVR function by most recent echo   F/u 4months   [name], M.D.

## 2024-04-12 NOTE — Patient Instructions (Signed)
 Medication Instructions:   Change Torsemide  to 20mg  alternating with 40mg  every other day  Continue all other medications.     Labwork:  BMET, Mg - orders given today Please do in 2 weeks Office will contact with results via phone, letter or mychart.     Testing/Procedures:  none  Follow-Up:  4 months   Any Other Special Instructions Will Be Listed Below (If Applicable).   If you need a refill on your cardiac medications before your next appointment, please call your pharmacy.

## 2024-04-12 NOTE — Addendum Note (Signed)
 Addended by: Aracelis Ulrey G on: 04/12/2024 10:51 AM   Modules accepted: Orders

## 2024-04-14 IMAGING — CT CT HEAD W/O CM
4 of 5 series · 13 of 47 positions shown, 15 images · non-contrast
Comparison: CT of the chest from 06/01/2021. CT head November 12, 2016.

CLINICAL DATA: Head trauma, moderate-severe Pt fell on the pavement
striking the back of her head. Pt does take coumadin.



[Series 4: head w o · axial · 0.44mm/px · z∈[+45,+75]mm · 2 of 33 slices shown]
[im 7/33  brain]
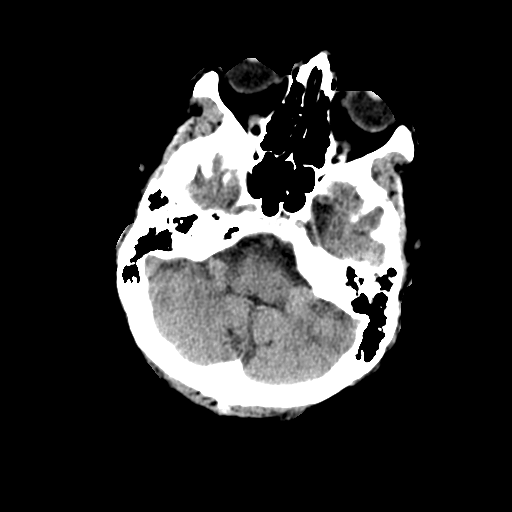
[im 13/33  brain]
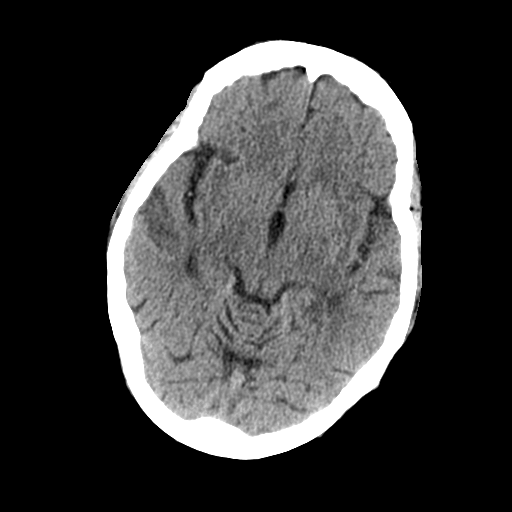

[Series 6: coronal soft · coronal · 0.34mm/px · 3 of 84 slices shown]
[im 28/84  brain]
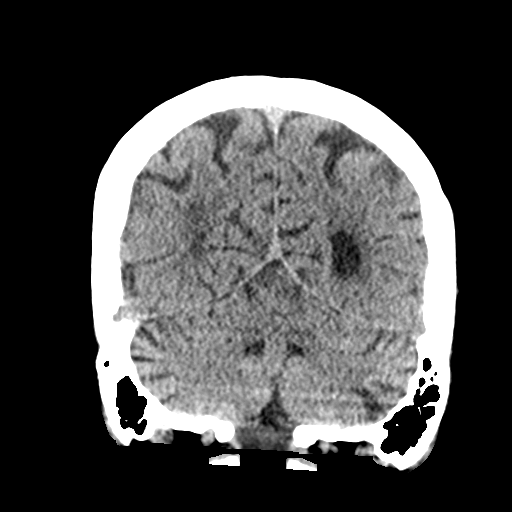
[im 37/84  brain]
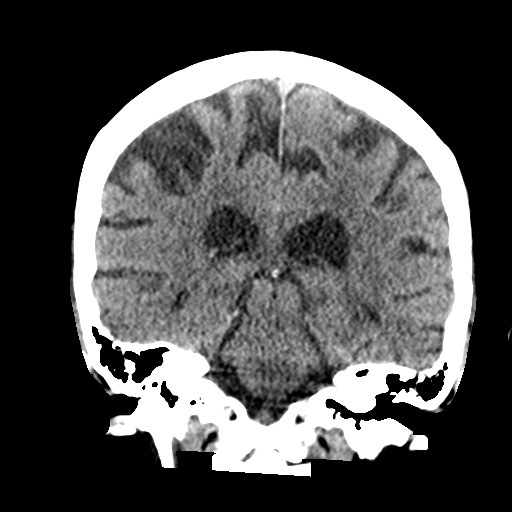
[im 47/84  brain]
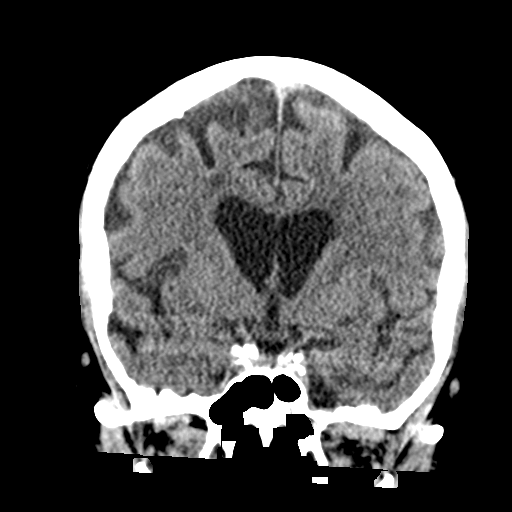

[Series 7: sagittal soft · sagittal · 0.34mm/px · 3 of 59 slices shown]
[im 20/59  brain]
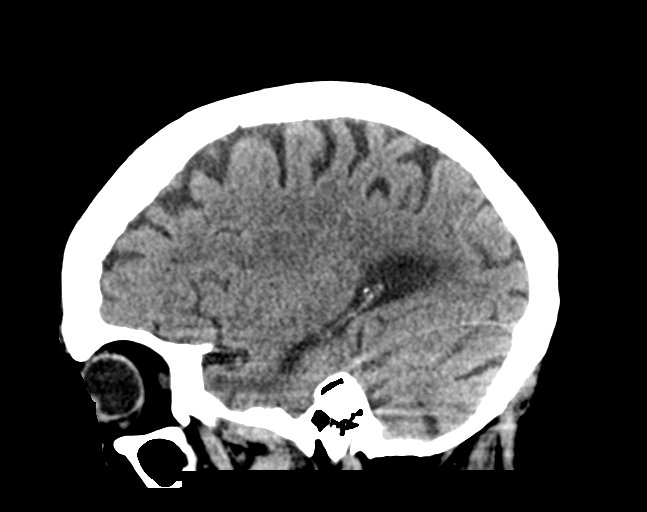
[im 30/59  brain]
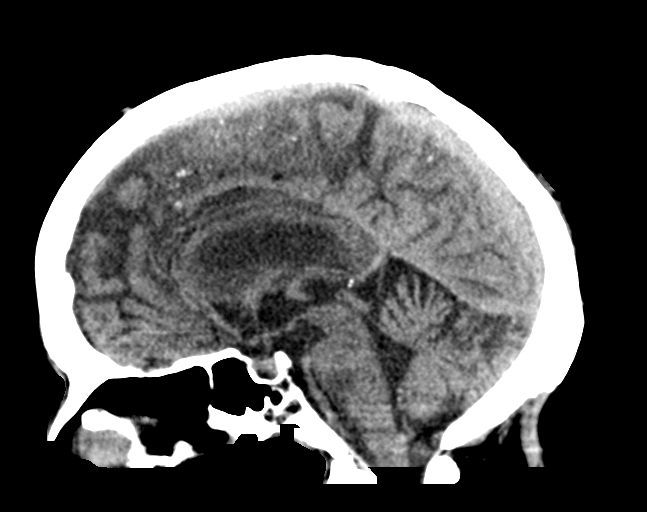
[im 39/59  brain]
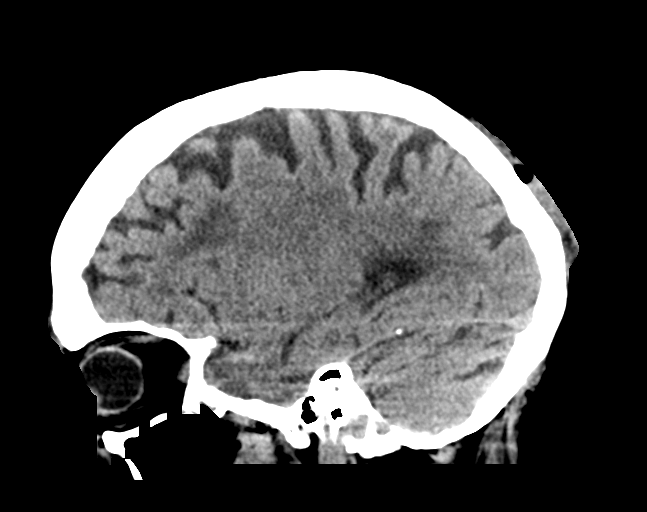

[Series 8: head ax w o · axial · 0.34mm/px · z∈[+42,+141]mm · 5 of 32 slices shown, 7 images]
[im 6/32  brain]
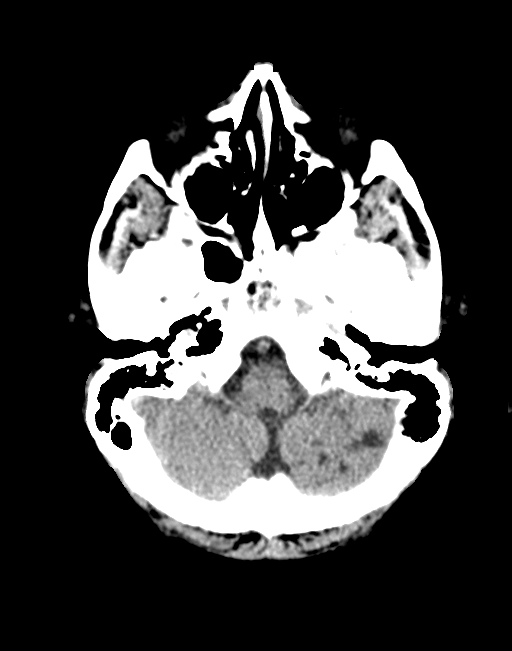
[im 6/32  bone]
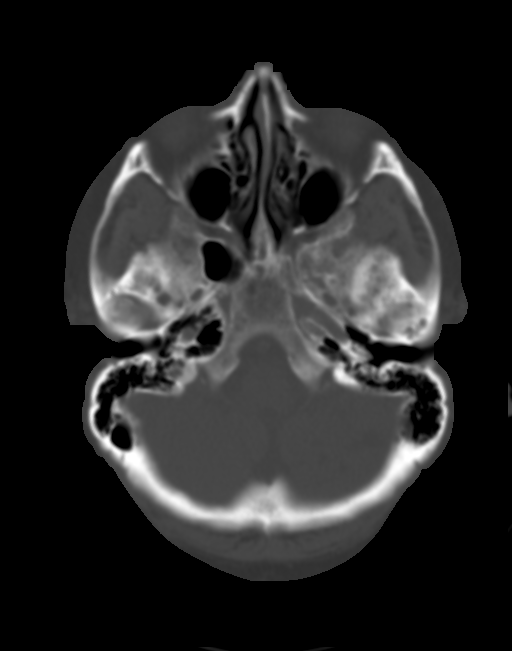
[im 11/32  brain]
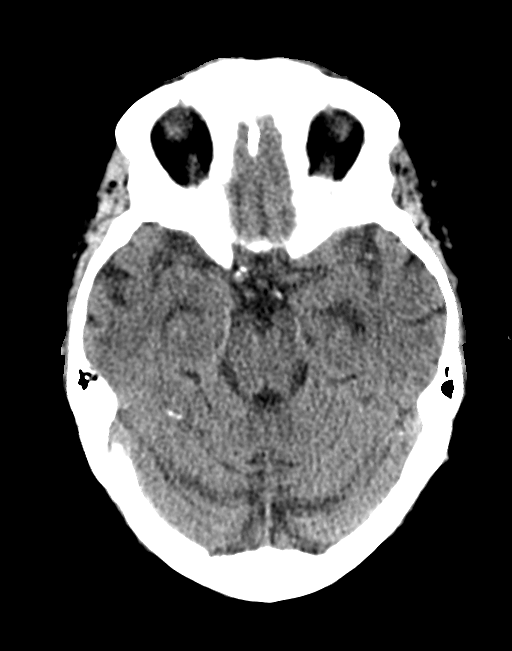
[im 16/32  brain]
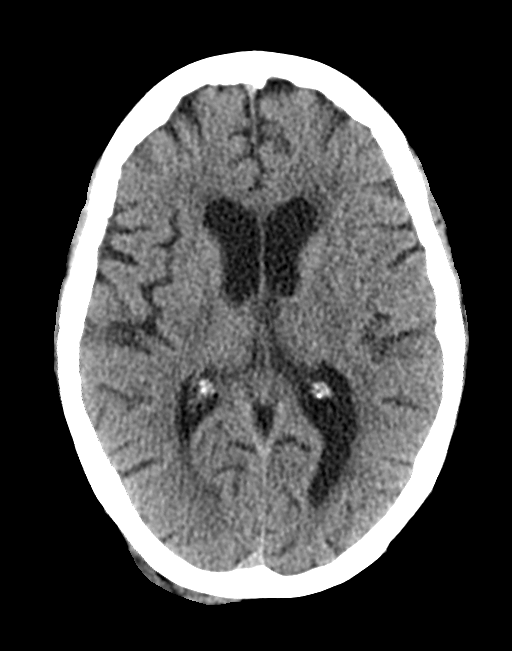
[im 21/32  brain]
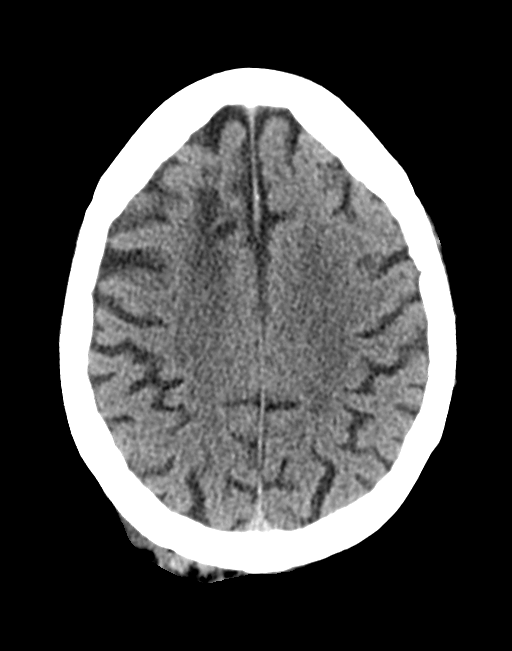
[im 26/32  brain]
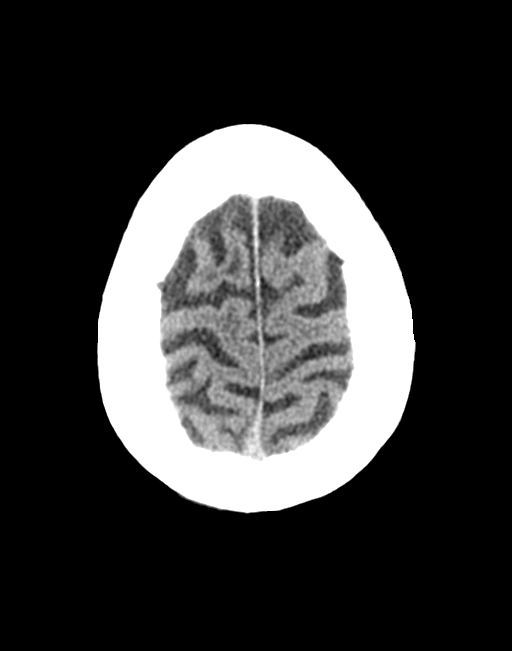
[im 26/32  bone]
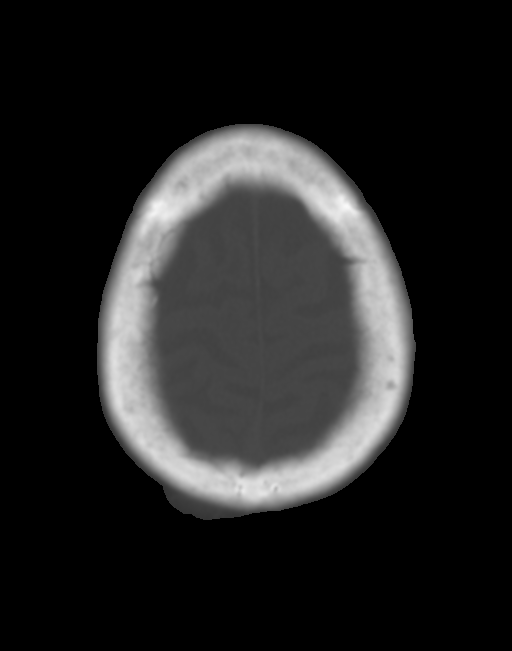

[13 of 47 positions shown; findings below may reference images not displayed]

FINDINGS: CT HEAD FINDINGS

Brain: No evidence of acute large vascular territory infarction,
hemorrhage, hydrocephalus, extra-axial collection or mass
lesion/mass effect. Remote right frontal infarct With
encephalomalacia. Remote left cerebellar infarcts. Chronic
microvascular ischemic disease.

Vascular: Calcific intracranial atherosclerosis. No hyperdense
vessel identified.

Skull: High right posterior scalp contusion/laceration without acute
calvarial fracture.

Sinuses/Orbits: Clear sinuses.  No acute orbital findings.

Other: No mastoid effusions.

CT CERVICAL SPINE FINDINGS

Alignment: Mild anterolisthesis of C4 on C5, favored degenerative in
etiology given facet arthropathy at this level. Otherwise, no
substantial sagittal subluxation. Straightening of the cervical
lordosis.

Skull base and vertebrae: Mild superior endplate sclerosis and
height loss of the T2 vertebral body is unchanged when compared to
CT of the chest from 06/01/2021. Small associated degenerative
Schmorl's node. No evidence of new vertebral body height loss or
acute fracture.

Soft tissues and spinal canal: No prevertebral fluid or swelling. No
visible canal hematoma.

Disc levels: Moderate multilevel degenerative disease and facet
arthropathy.

Upper chest: Mild interlobular septal thickening in the visualized
lung apices. Layering left and possible right pleural effusions.
IMPRESSION: CT head:

1. No evidence of acute intracranial abnormality.
2. High right posterior scalp contusion/laceration without acute
calvarial fracture.
3. Remote right frontal and left cerebellar infarcts.

CT cervical spine:

1. No evidence of acute fracture or traumatic malalignment.
2. Mild interlobular septal thickening in the visualized lung
apices, suggestive of interstitial edema. Layering left and possible
right pleural effusions. Recommend correlation with dedicated chest
imaging.
3. Mild superior endplate sclerosis and height loss of the T2
vertebral body is unchanged when compared to CT of the chest from
06/01/2021. This could be secondary to degenerative change and/or
prior fracture.

## 2024-04-18 ENCOUNTER — Ambulatory Visit: Payer: Self-pay | Admitting: Cardiology

## 2024-04-19 DIAGNOSIS — I480 Paroxysmal atrial fibrillation: Secondary | ICD-10-CM | POA: Diagnosis not present

## 2024-04-19 DIAGNOSIS — E1129 Type 2 diabetes mellitus with other diabetic kidney complication: Secondary | ICD-10-CM | POA: Diagnosis not present

## 2024-04-19 DIAGNOSIS — I5032 Chronic diastolic (congestive) heart failure: Secondary | ICD-10-CM | POA: Diagnosis not present

## 2024-04-19 DIAGNOSIS — N183 Chronic kidney disease, stage 3 unspecified: Secondary | ICD-10-CM | POA: Diagnosis not present

## 2024-04-25 DIAGNOSIS — I10 Essential (primary) hypertension: Secondary | ICD-10-CM | POA: Diagnosis not present

## 2024-04-25 DIAGNOSIS — I5032 Chronic diastolic (congestive) heart failure: Secondary | ICD-10-CM | POA: Diagnosis not present

## 2024-04-25 DIAGNOSIS — I4891 Unspecified atrial fibrillation: Secondary | ICD-10-CM | POA: Diagnosis not present

## 2024-04-25 DIAGNOSIS — Z79899 Other long term (current) drug therapy: Secondary | ICD-10-CM | POA: Diagnosis not present

## 2024-04-26 LAB — BASIC METABOLIC PANEL WITH GFR
BUN/Creatinine Ratio: 12 (ref 12–28)
BUN: 21 mg/dL (ref 8–27)
CO2: 22 mmol/L (ref 20–29)
Calcium: 9.3 mg/dL (ref 8.7–10.3)
Chloride: 98 mmol/L (ref 96–106)
Creatinine, Ser: 1.72 mg/dL — ABNORMAL HIGH (ref 0.57–1.00)
Glucose: 101 mg/dL — ABNORMAL HIGH (ref 70–99)
Potassium: 4.1 mmol/L (ref 3.5–5.2)
Sodium: 139 mmol/L (ref 134–144)
eGFR: 30 mL/min/1.73 — ABNORMAL LOW (ref >59)

## 2024-04-26 LAB — MAGNESIUM: Magnesium: 1.9 mg/dL (ref 1.6–2.3)

## 2024-04-28 ENCOUNTER — Other Ambulatory Visit: Payer: Self-pay | Admitting: Primary Care

## 2024-04-30 ENCOUNTER — Encounter

## 2024-05-01 ENCOUNTER — Ambulatory Visit: Attending: Cardiology | Admitting: *Deleted

## 2024-05-01 DIAGNOSIS — Z952 Presence of prosthetic heart valve: Secondary | ICD-10-CM

## 2024-05-01 DIAGNOSIS — Z5181 Encounter for therapeutic drug level monitoring: Secondary | ICD-10-CM | POA: Diagnosis not present

## 2024-05-01 LAB — POCT INR: INR: 2.6 (ref 2.0–3.0)

## 2024-05-01 NOTE — Patient Instructions (Signed)
 Continue warfarin 1 tablet daily except 1/2 tablet on Sundays and Thursdays Continue greens/salad Pt is holding off on colonoscopy procedure for now Amiodarone  200mg  decreased to 1 tablet daily on 02/15/24 Recheck in 4 weeks

## 2024-05-01 NOTE — Progress Notes (Signed)
 INR 2.6; Please see anticoagulation encounter

## 2024-05-03 ENCOUNTER — Ambulatory Visit: Payer: Medicare PPO | Admitting: Cardiovascular Disease

## 2024-05-12 ENCOUNTER — Other Ambulatory Visit: Payer: Self-pay | Admitting: Cardiology

## 2024-05-12 DIAGNOSIS — I4891 Unspecified atrial fibrillation: Secondary | ICD-10-CM

## 2024-05-12 DIAGNOSIS — Z952 Presence of prosthetic heart valve: Secondary | ICD-10-CM

## 2024-05-12 DIAGNOSIS — I639 Cerebral infarction, unspecified: Secondary | ICD-10-CM

## 2024-05-12 NOTE — Progress Notes (Unsigned)
 [name], female    DOB: 1945/11/04      MRN: [id number]   Brief patient profile:  78  yowf never smoker with a h/o sob related to asthma and valvular heart dz s/p MVR and AVR 2014   Seen prior to Epic in [place]: Feb 20, 2009 initial pulmonary eval started aerobics 3 months prior to OV  X 30 min weekly and tolerated well with improving ex tol until 3 weeks prior to OV   noted decrease tolerance and since then gradually worse to point where walking to mailbox slt uphill or climbing steps has to stop at top.  In retrospect coughing for 3 months dry, worse immediately on lying down. due to the cough lisnopril was stopped and could not tolerate the substitute and cough hasn't changed but breathing got worse. prednisone  no help so far and proaire no benefit. rec GERD diet   continue prilosec and reglan  stop prednisone  and proaire  avoid salt  maxzide 25 one each am and Dr Ceasar office   10/16/12 Meth challenge  + for hyperreactive airways disease  clance eval 05/14/13 [name] that at this point is clearly upper airway in origin.  [name] , but her current cough failed to respond to prednisone  tapers.  Therefore it is unlikely that it is related to cough variant asthma.  She does not have chronic sinusitis by her CT scan, and there is nothing on chest x-ray earlier in the year to explain her cough.  She does have a history of breast cancer, but there has been no evidence for recurrence.  [name], but this has an extremely low yield.  [name], and therefore I am concerned she may be having laryngopharyngeal reflux as a cause for her cough.  I also think she may have a cyclical mechanism as well she did have some response to tramadol , and we could consider a trial of gabapentin   AVR  07/18/13   04/2020 echo  1. Left ventricular ejection fraction, by  estimation, is 60 to 65%. The  left ventricle has normal function. The left ventricle has no regional  wall motion abnormalities. Left ventricular diastolic parameters are  indeterminate.   2. Right ventricular systolic function is low normal. The right  ventricular size is normal. There is moderately elevated pulmonary artery  systolic pressure. The estimated right ventricular systolic pressure is  45.5 mmHg.   3. Left atrial size was mildly dilated.   4. The mitral valve has been repaired/replaced. No evidence of mitral  valve regurgitation. The mean mitral valve gradient is 5.0 mmHg. There is  a 25 mm Magna Ease bioprosthetic valve present in the mitral position.  Grossly normal function.   5. The aortic valve has been repaired/replaced. Aortic valve  regurgitation is not visualized. There is a 21 mm Magna Ease bioprosthetic  valve present in the aortic position. Aortic valve mean gradient measures  7.0 mmHg. Grossly normal function.   6. The inferior vena cava is normal in size with greater than 50%  respiratory variability, suggesting right atrial pressure of 3 mmHg.        History of Present Illness  06/23/2020  Pulmonary/ 1st office eval/ Lether Tesch / [facility] / re establish re cough and sob on flovent  80 2bid  For cough variant asthma  Chief Complaint  Patient presents with  Consult    Patient has a dry cough that has been going on for many years but worse over the last 6 months. Shortness of breath with exertion.   Dyspnea:  Walks dog x 30 min / mostly flat uses cane to balance/ steps ok slowly due to breathing problems x sev years prior to OV   Cough: worse in afternoons and sometimes hs has to sit up 30 degrees to prevent cough supine  Sleep: ok as long as head is up 30 degrees new x 2 years  SABA use: sev times a day seems to help at 4 pm rec GERD diet  Pantoprazole  (protonix ) 40 mg   Take  30-60 min before first meal of the day and Pepcid  (famotidine )  20 mg one  after supper until return to office - this is the best way to tell whether stomach acid is contributing to your problem.   Plan A = Automatic = Always=    flovent  Take 2 puffs first thing in am and then another 2 puffs about 12 hours later.  Work on inhaler technique:  Plan B = Backup (to supplement plan A, not to replace it) Only use your albuterol  inhaler as a rescue medication   Please schedule a follow up office visit in 6 weeks, call sooner if needed - bring inhalers  Next step ? Gabapentin ?    07/22/2021  f/u ov/Tereka Thorley re: cough > sob   maint on flovent   for dx of MCT pos cough variant asthma / stopped gabapentin  ? Why ? Chief Complaint  Patient presents with   Follow-up    Productive cough with thick white mucus   Dyspnea:  mailbox and back is 200 yards slow pace Cough: daytime cough / lots of throat clearing  Sleeping: 30 degrees ok  SABA use: using saba bid/ poor flovent  use  02: none  Covid status:   vax x 5  Rec Pantoprazole  (protonix ) 40 mg   Take  30-60 min before first meal of the day and Pepcid  (famotidine )  20 mg after supper until return to office   Gabapentin  100 mg four times daily  Plan A = Automatic = Always=   Symbicort  80 mg  Take 2 puffs first thing in am and then another 2 puffs about 12 hours later. (Stop flovent  and atrovent - unless can't afford symbicort  on your plan) Plan B = Backup (to supplement plan A, not to replace it) Only use your albuterol  inhaler as a rescue medication   Plan C = Crisis (instead of Plan B but only if Plan B stops working) - only use your albuterol  nebulizer if you first try Plan B   For cough > mucinex  dm 1200 every 12 hours as needed    02/10/2023  f/u ov/[place] [facility]/Tahja Liao re: cough variant asthma  maint on bud 0.5 mg bid and albuterol  per neb   Chief Complaint  Patient presents with   Follow-up    SOB with exertion, prod cough with thick white sputum and wheezing.  Dyspnea: able to walk  stop sign and back 400 meters round  trip  Cough: worse lately with subjective  wheezing > no purulent sputum Sleeping: 30 degrees hob/ no resp cc  SABA use: neb doesn't help  02: none  Rec Increase the gabapentin  (100 x 2) three x daily(600 total)  until use up and if tolerated then start 300 mg  twice daily (600 mg total)  Depomedrol 120 mg IM  Plan A = Automatic =  Always=    Formoterol  20 mcg/ budesonide  0.50 twice daily  Plan B = Backup (to supplement plan A, not to replace it) Only use your albuterol  nebulizer  as a rescue medication   09/21/2023  f/u ov/[place] office/Kinley Ferrentino re: UACS vs asthma  maint on symbicort  80  Chief Complaint  Patient presents with   Cough   Shortness of Breath  Dyspnea:  if anything walking faster  Cough: better but still has sense of globus seems worse x the last 3 weeks prior to OV   Sleeping: 30 degrees hob s    resp cc  SABA use: rarely needs  02: no  Rec Increase gabapentin  to 300 mg three times daily with meals  My office will be contacting you by phone for referral to ENT  11/27/23 pos gerd/ rhinitis rx topicals    Labs 5/12 /25 IgE  37 with neg RAST and Rx trelegy per [facility] NP   05/13/2024  f/u ov/[place] office/Isacc Turney re:  UACS vs asthma(+MCT)  maint on trelegy no better breathing and cough worse Chief Complaint  Patient presents with   Cough    wet cough - white thick mucus shob  Dyspnea:  no change on trelegy Cough: throat clearing mostly daytime/ min mucoid production Sleeping: 30 degrees/ 2 pillows s resp cc  SABA use: prn use up on trelegy  02: none      No obvious day to day or daytime variability or assoc excess/ purulent sputum or mucus plugs or hemoptysis or cp or chest tightness, subjective wheeze or overt  hb symptoms.    Also denies any obvious fluctuation of symptoms with weather or environmental changes or other aggravating or alleviating factors except as outlined above   No unusual exposure hx or h/o childhood pna/ asthma or knowledge of premature  birth.  Current Allergies, Complete Past Medical History, Past Surgical History, Family History, and Social History were reviewed in [employer] record.  ROS  The following are not active complaints unless bolded Hoarseness, sore throat, dysphagia, dental problems, itching, sneezing,  nasal congestion or discharge of excess mucus or purulent secretions, ear ache,   fever, chills, sweats, unintended wt loss or wt gain, classically pleuritic or exertional cp,  orthopnea pnd or arm/hand swelling  or leg swelling, presyncope, palpitations, abdominal pain, anorexia, nausea, vomiting, diarrhea  or change in bowel habits or change in bladder habits, change in stools or change in urine, dysuria, hematuria,  rash, arthralgias, visual complaints, headache, numbness, weakness or ataxia or problems with walking or coordination,  change in mood or  memory.         Past Medical History:  Diagnosis Date   Anemia    Atrial fibrillation (HCC)    Breast carcinoma (HCC) 1995   1995   Cardioembolic stroke (HCC) 01/2012   Right frontal in 01/2012; normal carotid ultrasound; possible LAA thrombus by TEE; virtual complete neurologic recovery   Chronic kidney disease, stage 2, mildly decreased GFR    GFR of approximately 60   Depression    Diverticulosis of colon (without mention of hemorrhage) 2012   Dr. Golda   Fasting hyperglycemia    120 fasting   Gastroesophageal reflux disease    Gastroparesis    Hemorrhoids    Hyperlipidemia    Hypertension    pt denies 05/30/13     Dr Alvan chester   Hyponatremia    Rheumatic heart disease    a. 07/2013 Echo: Ef 55-60%, Mod AS/AI,  Mod-Sev MS, sev dil LA, PASP 58;  b. 07/2013 TEE: EF 35-40%, mild-mod AS/AI, mod-sev MS, LA smoke;  c. 07/2013 Cath: elev R heart pressures, nl cors.   Shortness of breath     Past Medical History:    Hx of NEOPLASM, MALIGNANT, BREAST (ICD-174.9)    HYPERLIPIDEMIA (ICD-272.4)    ASTHMA (ICD-493.90)    GERD  with gastroparesis   Past Surgical History:    Breast surgery 1995    Heart valve surgery 1962 age 22 [facility] Mitral valve     Tubal ligation-1973   Objective:     Wt  05/13/2024         160  09/21/2023      169  08/10/2023         169  02/10/2023        158  07/25/2022      167 04/20/2022        170  01/21/2022        177  12/13/2021        177 09/14/2021      170  07/22/2021      162  10/29/2020        177 09/16/2020      178   08/05/20 178 lb 12.8 oz (81.1 kg)  06/25/20 194 lb (88 kg)  06/23/20 180 lb (81.6 kg)      Vital signs reviewed  05/13/2024  - Note at rest 02 sats  94% on RA   General appearance:    sober wf lets husband answer the question / occ vigoros throat clearing   HEENT : Oropharynx  clear      Nasal turbinates nl    NECK :  without  apparent JVD/ palpable Nodes/TM    LUNGS: no acc muscle use,  Nl contour chest which is clear to A and P bilaterally without cough on insp or exp maneuvers   CV:  RRR  no s3 or murmur or increase in P2, and no edema   ABD:  soft and nontender   MS:  Gait slow but steady    ext warm without deformities Or obvious joint restrictions  calf tenderness, cyanosis or clubbing    SKIN: warm and dry without lesions    NEURO:  alert, approp, nl sensorium with  no motor or cerebellar deficits apparent.        Assessment     Assessment & Plan Asthma, cough variant  Upper airway cough syndrome in pt with mild cough variant asthma  Original onset 2010 on ACEi  Spiro 09/2012: normal  Sinus ct 2014:  No chronic sinus disease. 10/16/12 Meth challenge  + for hyperreactive airways disease 04/30/20 CT head sinus nl - 06/23/2020  After extensive coaching inhaler device,  effectiveness =    75% from a baseline of 25% > continue flovent  44 2bid and prn saba plus max gerd rx  08/05/2020    changed to symb 80 2bid and given depomedrol 120 mg IM  > no better - 09/16/2020  After extensive coaching inhaler device,  effectiveness =    50%  baseline of less than 25% - 09/16/2020 started gabapentin  titrate up to 100 mg qid  -  11/26/20  Sinus CT neg  - Allergy profile 07/22/2021 >  Eos 0.2 /  IgE  15 - 09/14/2021  After extensive coaching inhaler device,  effectiveness =    0% so changed to alb/bud bid > 12/13/2021 did not follow instructions so try again  same rx  With  Prednisone  10 mg take  4 each am x 2 days,   2 each am x 2 days,  1 each am x 2 days and stop and bud 0.25 mg bid combined with albuterol  since can't afford performist on her plan. - FENO 02/10/2023  =29  on bud 0.25 mg bid > added formoterol  20 mcg bid and increased  budesonide  0.5 mg bid  - 02/10/2023 try increase gabapentin  to 300 mg bid > increased 300 mg  tid 09/21/2023 due to globus  - 09/21/2023  After extensive coaching inhaler device,  effectiveness =    75% with with spacer on 3rd try  - 09/21/2023  increased gabapentin  to 300 mg tid > could not tol   -  09/21/2023 referred to ENT > no new findings  gerd/ rhinitis -  05/14/2024  worse on trelegy > change back to symb 80 one bid via spacer and f/u on 4 weeks plus 6 d prednisone  and d/c trelegy   The challenge is treating the asthma s aggravating the  UACS and in theory the best option = symb 80 one bid via spacer   F/u in 4 weeks to check response to trial and verify good technique    Dyspnea on exertion 12/13/2021   Walked on RA  x  3  lap(s) =  approx 450  ft  @ slow pace, stopped due to end of study/ sob with lowest 02 sats 93%  - Echo 04/01/22   1. Left ventricular ejection fraction, by estimation, is 60%. The left  ventricle has normal function. The left ventricle has no regional wall  motion abnormalities. Left ventricular diastolic function could not be  evaluated.   2. Caclified papillary muscles.   3. Right ventricular systolic function is normal. The right ventricular  size is normal. There is normal pulmonary artery systolic pressure.   4. Left atrial size was moderately dilated. 04/20/2022    Walked on RA  x  3  lap(s) =  approx 450   ft  @ moderate/cane pace, stopped due to end of study with lowest 02 sats 91% and mild sob starting on 2nd lap  - 08/10/2023 patient walked 2 laps at a slow pace with cane; patient declined 3rd lap d/t shob/fatigue with lowest sats 90%  05/13/2024   Walked on RA  x  2  lap(s) =  approx 300  ft  @ slow pace, stopped due to sob with lowest 02 sats 97%    Multifactorial but not really limited by airflow or 02 sats at this point so no further pulmonary w/u needed, enccourage regular paced (sub max ) walking.  Each maintenance medication was reviewed in detail including emphasizing most importantly the difference between maintenance and prns and under what circumstances the prns are to be triggered using an action plan format where appropriate.  Total time for H and P, chart review, counseling, reviewing hfa/spacer/neb device(s) , directly observing portions of ambulatory 02 saturation study/ and generating customized AVS unique to this office visit / same day charting = 35 min for multiple  refractory respiratory  symptoms of uncertain etiology                   Patient Instructions  Symbicort  80 one puff twice daily via spacer   Prednisone  10 mg take  4 each am x 2 days,   2 each am x 2 days,  1 each am x 2 days and stop  Stop the Trelegy and only use albuterol  as needed if you can't catch your breath   Please schedule a follow up office visit in 4 weeks, sooner if needed with symbicort  80 and spacer.     Ozell America, MD 05/14/2024

## 2024-05-13 ENCOUNTER — Ambulatory Visit: Admitting: Internal Medicine

## 2024-05-13 ENCOUNTER — Encounter: Payer: Self-pay | Admitting: Internal Medicine

## 2024-05-13 VITALS — BP 121/60 | HR 65 | Ht 63.0 in | Wt 159.8 lb

## 2024-05-13 DIAGNOSIS — R0609 Other forms of dyspnea: Secondary | ICD-10-CM | POA: Diagnosis not present

## 2024-05-13 DIAGNOSIS — J45991 Cough variant asthma: Secondary | ICD-10-CM | POA: Diagnosis not present

## 2024-05-13 DIAGNOSIS — J988 Other specified respiratory disorders: Secondary | ICD-10-CM

## 2024-05-13 DIAGNOSIS — R058 Other specified cough: Secondary | ICD-10-CM

## 2024-05-13 MED ORDER — PREDNISONE 10 MG PO TABS
ORAL_TABLET | ORAL | 0 refills | Status: DC
Start: 2024-05-13 — End: 2024-06-15

## 2024-05-13 MED ORDER — BUDESONIDE-FORMOTEROL FUMARATE 80-4.5 MCG/ACT IN AERO: INHALATION_SPRAY | RESPIRATORY_TRACT | 12 refills | Status: DC

## 2024-05-13 NOTE — Patient Instructions (Addendum)
 Symbicort  80 one puff twice daily via spacer   Prednisone  10 mg take  4 each am x 2 days,   2 each am x 2 days,  1 each am x 2 days and stop   Stop the Trelegy and only use albuterol  as needed if you can't catch your breath   Please schedule a follow up office visit in 4 weeks, sooner if needed with symbicort  80 and spacer.

## 2024-05-14 NOTE — Assessment & Plan Note (Addendum)
 12/13/2021   Walked on RA  x  3  lap(s) =  approx 450  ft  @ slow pace, stopped due to end of study/ sob with lowest 02 sats 93%  - Echo 04/01/22   1. Left ventricular ejection fraction, by estimation, is 60%. The left  ventricle has normal function. The left ventricle has no regional wall  motion abnormalities. Left ventricular diastolic function could not be  evaluated.   2. Caclified papillary muscles.   3. Right ventricular systolic function is normal. The right ventricular  size is normal. There is normal pulmonary artery systolic pressure.   4. Left atrial size was moderately dilated. 04/20/2022   Walked on RA  x  3  lap(s) =  approx 450   ft  @ moderate/cane pace, stopped due to end of study with lowest 02 sats 91% and mild sob starting on 2nd lap  - 08/10/2023 patient walked 2 laps at a slow pace with cane; patient declined 3rd lap d/t shob/fatigue with lowest sats 90%  05/13/2024   Walked on RA  x  2  lap(s) =  approx 300  ft  @ slow pace, stopped due to sob with lowest 02 sats 97%    Multifactorial but not really limited by airflow or 02 sats at this point so no further pulmonary w/u needed, enccourage regular paced (sub max ) walking.  Each maintenance medication was reviewed in detail including emphasizing most importantly the difference between maintenance and prns and under what circumstances the prns are to be triggered using an action plan format where appropriate.  Total time for H and P, chart review, counseling, reviewing hfa/spacer/neb device(s) , directly observing portions of ambulatory 02 saturation study/ and generating customized AVS unique to this office visit / same day charting = 35 min for multiple  refractory respiratory  symptoms of uncertain etiology

## 2024-05-14 NOTE — Assessment & Plan Note (Addendum)
 Original onset 2010 on ACEi  Spiro 09/2012: normal  Sinus ct 2014:  No chronic sinus disease. 10/16/12 Meth challenge  + for hyperreactive airways disease 04/30/20 CT head sinus nl - 06/23/2020  After extensive coaching inhaler device,  effectiveness =    75% from a baseline of 25% > continue flovent  44 2bid and prn saba plus max gerd rx  08/05/2020    changed to symb 80 2bid and given depomedrol 120 mg IM  > no better - 09/16/2020  After extensive coaching inhaler device,  effectiveness =    50% baseline of less than 25% - 09/16/2020 started gabapentin  titrate up to 100 mg qid  -  11/26/20  Sinus CT neg  - Allergy profile 07/22/2021 >  Eos 0.2 /  IgE  15 - 09/14/2021  After extensive coaching inhaler device,  effectiveness =    0% so changed to alb/bud bid > 12/13/2021 did not follow instructions so try again same rx  With  Prednisone  10 mg take  4 each am x 2 days,   2 each am x 2 days,  1 each am x 2 days and stop and bud 0.25 mg bid combined with albuterol  since can't afford performist on her plan. - FENO 02/10/2023  =29  on bud 0.25 mg bid > added formoterol  20 mcg bid and increased  budesonide  0.5 mg bid  - 02/10/2023 try increase gabapentin  to 300 mg bid > increased 300 mg  tid 09/21/2023 due to globus  - 09/21/2023  After extensive coaching inhaler device,  effectiveness =    75% with with spacer on 3rd try  - 09/21/2023  increased gabapentin  to 300 mg tid > could not tol   -  09/21/2023 referred to ENT > no new findings  gerd/ rhinitis -  05/14/2024  worse on trelegy > change back to symb 80 one bid via spacer and f/u on 4 weeks plus 6 d prednisone  and d/c trelegy   The challenge is treating the asthma s aggravating the  UACS and in theory the best option = symb 80 one bid via spacer   F/u in 4 weeks to check response to trial and verify good technique

## 2024-05-24 ENCOUNTER — Encounter: Payer: Self-pay | Admitting: Radiology

## 2024-05-29 ENCOUNTER — Ambulatory Visit: Attending: Cardiology | Admitting: *Deleted

## 2024-05-29 DIAGNOSIS — Z5181 Encounter for therapeutic drug level monitoring: Secondary | ICD-10-CM | POA: Diagnosis not present

## 2024-05-29 DIAGNOSIS — Z952 Presence of prosthetic heart valve: Secondary | ICD-10-CM | POA: Diagnosis not present

## 2024-05-29 LAB — POCT INR: INR: 3.1 — AB (ref 2.0–3.0)

## 2024-05-29 NOTE — Progress Notes (Signed)
 INR 3.1  Please see anticoagulation encounter

## 2024-05-29 NOTE — Patient Instructions (Signed)
 Continue warfarin 1 tablet daily except 1/2 tablet on Sundays and Thursdays Continue greens/salad Pt is holding off on colonoscopy procedure for now Amiodarone  200mg  decreased to 1 tablet daily on 02/15/24 Recheck in 5 weeks

## 2024-05-30 ENCOUNTER — Other Ambulatory Visit: Payer: Self-pay

## 2024-05-30 ENCOUNTER — Emergency Department (HOSPITAL_COMMUNITY)
Admission: EM | Admit: 2024-05-30 | Discharge: 2024-05-30 | Disposition: A | Discharge status: Home / Self Care | Attending: Emergency Medicine | Admitting: Emergency Medicine

## 2024-05-30 ENCOUNTER — Encounter (HOSPITAL_COMMUNITY): Payer: Self-pay

## 2024-05-30 ENCOUNTER — Emergency Department (HOSPITAL_COMMUNITY)

## 2024-05-30 DIAGNOSIS — Z7984 Long term (current) use of oral hypoglycemic drugs: Secondary | ICD-10-CM | POA: Diagnosis not present

## 2024-05-30 DIAGNOSIS — I509 Heart failure, unspecified: Secondary | ICD-10-CM | POA: Diagnosis not present

## 2024-05-30 DIAGNOSIS — R059 Cough, unspecified: Secondary | ICD-10-CM | POA: Diagnosis not present

## 2024-05-30 DIAGNOSIS — I130 Hypertensive heart and chronic kidney disease with heart failure and stage 1 through stage 4 chronic kidney disease, or unspecified chronic kidney disease: Secondary | ICD-10-CM | POA: Diagnosis not present

## 2024-05-30 DIAGNOSIS — Z79899 Other long term (current) drug therapy: Secondary | ICD-10-CM | POA: Diagnosis not present

## 2024-05-30 DIAGNOSIS — E039 Hypothyroidism, unspecified: Secondary | ICD-10-CM | POA: Insufficient documentation

## 2024-05-30 DIAGNOSIS — J449 Chronic obstructive pulmonary disease, unspecified: Secondary | ICD-10-CM | POA: Diagnosis not present

## 2024-05-30 DIAGNOSIS — I110 Hypertensive heart disease with heart failure: Secondary | ICD-10-CM | POA: Diagnosis not present

## 2024-05-30 DIAGNOSIS — R072 Precordial pain: Secondary | ICD-10-CM | POA: Diagnosis not present

## 2024-05-30 DIAGNOSIS — E1122 Type 2 diabetes mellitus with diabetic chronic kidney disease: Secondary | ICD-10-CM | POA: Insufficient documentation

## 2024-05-30 DIAGNOSIS — R1010 Upper abdominal pain, unspecified: Secondary | ICD-10-CM | POA: Diagnosis not present

## 2024-05-30 DIAGNOSIS — N189 Chronic kidney disease, unspecified: Secondary | ICD-10-CM | POA: Insufficient documentation

## 2024-05-30 DIAGNOSIS — R0602 Shortness of breath: Secondary | ICD-10-CM | POA: Diagnosis present

## 2024-05-30 DIAGNOSIS — Z7901 Long term (current) use of anticoagulants: Secondary | ICD-10-CM | POA: Insufficient documentation

## 2024-05-30 DIAGNOSIS — J45909 Unspecified asthma, uncomplicated: Secondary | ICD-10-CM | POA: Diagnosis not present

## 2024-05-30 LAB — CBC WITH DIFFERENTIAL/PLATELET
Abs Granulocyte: 4.4 K/uL (ref 1.5–6.5)
Abs Immature Granulocytes: 0.04 K/uL (ref 0.00–0.07)
Basophils Absolute: 0.1 K/uL (ref 0.0–0.1)
Basophils Relative: 1 %
Eosinophils Absolute: 0.1 K/uL (ref 0.0–0.5)
Eosinophils Relative: 2 %
HCT: 40.7 % (ref 36.0–46.0)
Hemoglobin: 12.3 g/dL (ref 12.0–15.0)
Immature Granulocytes: 1 %
Lymphocytes Relative: 16 %
Lymphs Abs: 1 K/uL (ref 0.7–4.0)
MCH: 27.1 pg (ref 26.0–34.0)
MCHC: 30.2 g/dL (ref 30.0–36.0)
MCV: 89.6 fL (ref 80.0–100.0)
Monocytes Absolute: 0.7 K/uL (ref 0.1–1.0)
Monocytes Relative: 11 %
Neutro Abs: 4.4 K/uL (ref 1.7–7.7)
Neutrophils Relative %: 69 %
Platelets: 130 K/uL — ABNORMAL LOW (ref 150–400)
RBC: 4.54 MIL/uL (ref 3.87–5.11)
RDW: 16 % — ABNORMAL HIGH (ref 11.5–15.5)
WBC: 6.2 K/uL (ref 4.0–10.5)
nRBC: 0 % (ref 0.0–0.2)

## 2024-05-30 LAB — BRAIN NATRIURETIC PEPTIDE: B Natriuretic Peptide: 528 pg/mL — ABNORMAL HIGH (ref 0.0–100.0)

## 2024-05-30 LAB — COMPREHENSIVE METABOLIC PANEL WITH GFR
ALT: 27 U/L (ref 0–44)
AST: 26 U/L (ref 15–41)
Albumin: 3.8 g/dL (ref 3.5–5.0)
Alkaline Phosphatase: 56 U/L (ref 38–126)
Anion gap: 14 (ref 5–15)
BUN: 17 mg/dL (ref 8–23)
CO2: 22 mmol/L (ref 22–32)
Calcium: 9.2 mg/dL (ref 8.9–10.3)
Chloride: 102 mmol/L (ref 98–111)
Creatinine, Ser: 1.68 mg/dL — ABNORMAL HIGH (ref 0.44–1.00)
GFR, Estimated: 31 mL/min — ABNORMAL LOW (ref >60)
Glucose, Bld: 106 mg/dL — ABNORMAL HIGH (ref 70–99)
Potassium: 3.6 mmol/L (ref 3.5–5.1)
Sodium: 138 mmol/L (ref 135–145)
Total Bilirubin: 0.7 mg/dL (ref 0.0–1.2)
Total Protein: 7.1 g/dL (ref 6.5–8.1)

## 2024-05-30 LAB — BLOOD GAS, VENOUS
Acid-Base Excess: 2.3 mmol/L — ABNORMAL HIGH (ref 0.0–2.0)
Bicarbonate: 26.5 mmol/L (ref 20.0–28.0)
Drawn by: 23968
O2 Saturation: 65.7 %
Patient temperature: 36.7
pCO2, Ven: 38 mmHg — ABNORMAL LOW (ref 44–60)
pH, Ven: 7.44 — ABNORMAL HIGH (ref 7.25–7.43)
pO2, Ven: 36 mmHg (ref 32–45)

## 2024-05-30 LAB — RESP PANEL BY RT-PCR (RSV, FLU A&B, COVID)  RVPGX2
Influenza A by PCR: NEGATIVE
Influenza B by PCR: NEGATIVE
Resp Syncytial Virus by PCR: NEGATIVE
SARS Coronavirus 2 by RT PCR: NEGATIVE

## 2024-05-30 LAB — TROPONIN I (HIGH SENSITIVITY)
Troponin I (High Sensitivity): 15 ng/L (ref <18)
Troponin I (High Sensitivity): 15 ng/L (ref <18)

## 2024-05-30 MED ORDER — IPRATROPIUM-ALBUTEROL 0.5-2.5 (3) MG/3ML IN SOLN
3.0000 mL | Freq: Once | RESPIRATORY_TRACT | Status: AC
  Administered 2024-05-30: 3 mL via RESPIRATORY_TRACT
  Filled 2024-05-30: qty 3

## 2024-05-30 MED ORDER — FUROSEMIDE 10 MG/ML IJ SOLN
40.0000 mg | Freq: Once | INTRAMUSCULAR | Status: AC
  Administered 2024-05-30: 40 mg via INTRAVENOUS
  Filled 2024-05-30: qty 4

## 2024-05-30 NOTE — ED Notes (Signed)
 Emptied urine at 

## 2024-05-30 NOTE — ED Notes (Signed)
 Replaced urine container. Had in it this time, for total of so far.

## 2024-05-30 NOTE — ED Provider Notes (Signed)
 [place] EMERGENCY DEPARTMENT AT [facility] Provider Note  CSN: [id number] Arrival date & time: 05/30/24 9349  Chief Complaint(s) Shortness of Breath  HPI [name] is a 78 y.o. female history of atrial fibrillation on warfarin, CHF, CKD, diabetes, hypertension, hyperlipidemia, prior valve replacement presenting to the emergency department with shortness of breath.  Patient reports her shortness of breath starting today.  Reports shortness of breath with exertion.  Also reports some cough.  No fevers or chills.  No sore throat or runny nose.  No sick contacts.  Tried using inhaler without improvement.  Reports compliance with other medications.  Reported short episode of chest pain earlier that this resolved.  No back pain.  No abdominal pain.  Reports some nausea.   Past Medical History Past Medical History:  Diagnosis Date   Anemia    Arrhythmia 2020?   Asthma    Atrial fibrillation (HCC)    Breast carcinoma (HCC) 1995   1995   Cardioembolic stroke (HCC) 01/2012   Right frontal in 01/2012; normal carotid ultrasound; possible LAA thrombus by TEE; virtual complete neurologic recovery   Carotid artery occlusion 1961   CHF (congestive heart failure) (HCC) 5+ years   Chronic kidney disease, stage 2, mildly decreased GFR    GFR of approximately 60   Diabetes mellitus without complication (HCC)    Diverticulosis of colon (without mention of hemorrhage) 2012   Dr. Golda   Dysrhythmia    Fasting hyperglycemia    120 fasting   Gastroesophageal reflux disease    Gastroparesis    Heart murmur 1961   Hemorrhoids    Hyperlipidemia    Hypertension    pt denies 05/30/13     Dr Alvan chester   Hyponatremia    Hypothyroidism    Rheumatic heart disease    a. 07/2013 Echo: Ef 55-60%, Mod AS/AI, Mod-Sev MS, sev dil LA, PASP 58;  b. 07/2013 TEE: EF 35-40%, mild-mod AS/AI, mod-sev MS, LA smoke;  c. 07/2013 Cath: elev R heart pressures, nl cors.   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   Asthma exacerbation 11/12/2016   Synovial cyst of lumbar spine 05/13/2014   Encounter for therapeutic drug monitoring 11/06/2013   Hyponatremia    Atrial fibrillation (HCC)    Moderate aortic stenosis 07/12/2013   Moderate aortic insufficiency 07/12/2013   Status post aortic valve and mitral valve replacement 07/12/2013   Severe mitral valve stenosis 07/09/2013   Mitral stenosis 07/08/2013   Chest pain 07/07/2013   Asthma, cough variant 03/27/2013   Upper airway cough syndrome in pt with mild cough variant asthma  09/17/2012   Dyspnea on exertion 06/13/2012   Edema 06/13/2012   Rheumatic heart disease 06/13/2012   Fasting hyperglycemia    Chronic anticoagulation 01/26/2012   Hypothyroidism 01/23/2012   Cardioembolic stroke (HCC) 01/02/2012   Hyperlipidemia    Essential hypertension    Breast carcinoma (HCC)    Gastroesophageal reflux disease    Home Medication(s) Prior to  Admission medications   Medication Sig Start Date End Date Taking? Authorizing Provider  acetaminophen  (TYLENOL ) 325 MG tablet Take 2 tablets (650 mg total) by mouth every 6 (six) hours as needed. Patient taking differently: Take 650 mg by mouth as needed. 01/20/24  Yes [name], DO  albuterol  (VENTOLIN  HFA) 108 (90 Base) MCG/ACT inhaler Inhale 2 puffs into the lungs every 6 (six) hours as needed for wheezing or shortness of breath. 08/17/23  Yes [provider]  amiodarone  (PACERONE ) 200 MG  tablet Take 1 tablet (200 mg total) by mouth daily. 02/02/24  Yes Mealor, [name], MD  atorvastatin  (LIPITOR) 40 MG tablet TAKE ONE TABLET BY MOUTH ONCE DAILY. 07/24/23  Yes BranchDorn FALCON, MD  azelastine  (ASTELIN ) 0.1 % nasal spray Place 1 spray into both nostrils 2 (two) times daily. Use in each nostril as directed 04/30/24  Yes [name], NP  bisoprolol  (ZEBETA ) 5 MG tablet Take 1 tablet (5 mg total) by mouth daily. 02/23/24  Yes Furth, Cadence H, PA-C  budesonide -formoterol  (SYMBICORT ) 80-4.5 MCG/ACT inhaler One puff first thing in am and 12 hours later 05/13/24  Yes Darlean Ozell NOVAK, MD  escitalopram  (LEXAPRO ) 10 MG tablet Take 10 mg by mouth daily with breakfast. 04/10/15  Yes [provider]  famotidine  (PEPCID ) 20 MG tablet TAKE ONE TABLET BY MOUTH EVERY DAY AFTER SUPPER 12/25/23  Yes Darlean Ozell NOVAK, MD  FARXIGA  10 MG TABS tablet Take 10 mg by mouth daily. 03/28/23  Yes [provider]  fexofenadine (ALLEGRA) 180 MG tablet Take 180 mg by mouth in the morning.   Yes [provider]  fluticasone  (FLONASE ) 50 MCG/ACT nasal spray Place 1 spray into both nostrils daily. 09/05/13  Yes BranchDorn FALCON, MD  levothyroxine  (SYNTHROID ) 100 MCG tablet Take 100 mcg by mouth daily before breakfast.   Yes [provider]  losartan  (COZAAR ) 25 MG tablet Take 25 mg by mouth daily. 01/22/24  Yes [provider]  metFORMIN  (GLUCOPHAGE ) 500 MG tablet Take 1 tablet (500 mg total) by mouth 2 (two) times daily with a meal. 08/03/13  Yes Collins, Gina L, PA-C  pantoprazole  (PROTONIX ) 40 MG tablet TAKE 1 TABLET 30 TO 60 MINUTES BEFORE FIRST MEAL OF THE DAY. 03/15/24  Yes Darlean Ozell NOVAK, MD  Polyethyl Glycol-Propyl Glycol (SYSTANE OP) Place 1 drop into both eyes daily as needed (dry eyes).   Yes [provider]  potassium chloride  SA (KLOR-CON  M) 20 MEQ tablet Take 1 tablet (20 mEq total) by mouth daily. 11/14/22 05/30/24 Yes Rai, Ripudeep K, MD   promethazine -dextromethorphan  (PROMETHAZINE -DM) 6.25-15 MG/5ML syrup Take 5 mLs by mouth 4 (four) times daily as needed. 02/12/24  Yes [name], NP  torsemide  (DEMADEX ) 20 MG tablet Take 20mg  (1 tab) by mouth every other day alternating with 40mg  (2 tabs) every other day 04/12/24  Yes Branch, Dorn FALCON, MD  warfarin (COUMADIN ) 2.5 MG tablet TAKE AS DIRECTED BY COUMADIN  CLINIC. Patient taking differently: Take 2.5 mg by mouth See admin instructions. TAKE AS DIRECTED BY COUMADIN  CLINIC.Take 1 tablet every day except 1/2 tablet on Thursday and Sunday 05/13/24  Yes Branch, Dorn FALCON, MD  predniSONE  (DELTASONE ) 10 MG tablet Take  4 each am x 2 days,   2 each am x 2 days,  1 each am x 2 days and stop 05/13/24   Darlean Ozell NOVAK, MD  Past Surgical History Past Surgical History:  Procedure Laterality Date   AORTIC VALVE REPLACEMENT N/A 07/18/2013   Procedure: AORTIC VALVE REPLACEMENT (AVR);  Surgeon: Dorise MARLA Fellers, MD;  Location: [facility] OR;  Service: Open Heart Surgery;  Laterality: N/A;   BACK SURGERY     BREAST LUMPECTOMY Right 10/03/1993   CARDIAC CATHETERIZATION  5+ years   CARDIOVERSION N/A 07/26/2013   Procedure: CARDIOVERSION;  Surgeon: Vina LULLA Gull, MD;  Location: [facility] ENDOSCOPY;  Service: Cardiovascular;  Laterality: N/A;   CARDIOVERSION N/A 01/04/2023   Procedure: CARDIOVERSION;  Surgeon: Hobart Powell BRAVO, MD;  Location: [facility] ENDOSCOPY;  Service: Cardiovascular;  Laterality: N/A;   CARDIOVERSION N/A 02/23/2024   Procedure: CARDIOVERSION;  Surgeon: [name], MD;  Location: [facility] ORS;  Service: Endoscopy;  Laterality: N/A;   COLONOSCOPY  10/03/2010   Negative screening procedure   DILATION AND CURETTAGE OF UTERUS     ESOPHAGEAL MANOMETRY N/A 06/17/2013   Procedure: ESOPHAGEAL MANOMETRY (EM);  Surgeon: Alm JONELLE Gander, MD;  Location: [facility] ENDOSCOPY;   Service: Endoscopy;  Laterality: N/A;   INTRAOPERATIVE TRANSESOPHAGEAL ECHOCARDIOGRAM N/A 07/18/2013   Procedure: INTRAOPERATIVE TRANSESOPHAGEAL ECHOCARDIOGRAM;  Surgeon: Dorise MARLA Fellers, MD;  Location: [facility] OR;  Service: Open Heart Surgery;  Laterality: N/A;   LEFT AND RIGHT HEART CATHETERIZATION WITH CORONARY ANGIOGRAM N/A 07/10/2013   Procedure: LEFT AND RIGHT HEART CATHETERIZATION WITH CORONARY ANGIOGRAM;  Surgeon: Lonni JONETTA Cash, MD;  Location: [facility] CATH LAB;  Service: Cardiovascular;  Laterality: N/A;   LUMBAR LAMINECTOMY/DECOMPRESSION MICRODISCECTOMY Right 05/13/2014   Procedure: LUMBAR LAMINECTOMY/DECOMPRESSION MICRODISCECTOMY 1 LEVEL  lumbar four/five;  Surgeon: Darina MALVA Boehringer, MD;  Location: [facility] NEURO ORS;  Service: Neurosurgery;  Laterality: Right;   MITRAL VALVE REPLACEMENT N/A 07/18/2013   Procedure: MITRAL VALVE (MV) REPLACEMENT;  Surgeon: Dorise MARLA Fellers, MD;  Location: [facility] OR;  Service: Open Heart Surgery;  Laterality: N/A;   MITRAL VALVE SURGERY  10/03/1960   [facility], closed mitral valvulotomy by finger fracture   TEE WITHOUT CARDIOVERSION  01/24/2012   Procedure: TRANSESOPHAGEAL ECHOCARDIOGRAM (TEE);  Surgeon: Redell GORMAN Shallow, MD;  Location: [facility] ENDOSCOPY;  Service: Cardiovascular;  Laterality: N/A;   TEE WITHOUT CARDIOVERSION N/A 07/11/2013   Procedure: TRANSESOPHAGEAL ECHOCARDIOGRAM (TEE);  Surgeon: Aleene JINNY Passe, MD;  Location: [facility] ENDOSCOPY;  Service: Cardiovascular;  Laterality: N/A;   TEE WITHOUT CARDIOVERSION N/A 07/26/2013   Procedure: TRANSESOPHAGEAL ECHOCARDIOGRAM (TEE);  Surgeon: Vina LULLA Gull, MD;  Location: [facility] ENDOSCOPY;  Service: Cardiovascular;  Laterality: N/A;   TUBAL LIGATION  10/04/1971   Family History Family History  Problem Relation Age of Onset   Heart disease Brother 59       MI   Rheum arthritis Maternal Grandmother    Asthma Maternal Grandfather    Hypothyroidism Mother    Diabetes Sister    Cirrhosis Father     Social History Social  History   Tobacco Use   Smoking status: Never   Smokeless tobacco: Never  Vaping Use   Vaping status: Never Used  Substance Use Topics   Alcohol use: No    Alcohol/week: 0.0 standard drinks of alcohol   Drug use: No   Allergies Patient has no known allergies.  Review of Systems Review of Systems  All other systems reviewed and are negative.   Physical Exam Vital Signs  I have reviewed the triage vital signs BP 122/70   Pulse 63   Temp 98.1 F (36.7 C) (Oral)   Resp 15   Ht 5' 3 (1.6 m)  Wt 70.8 kg   SpO2 93%   BMI 27.63 kg/m  Physical Exam Vitals and nursing note reviewed.  Constitutional:      General: She is not in acute distress.    Appearance: She is well-developed.  HENT:     Head: Normocephalic and atraumatic.     Mouth/Throat:     Mouth: Mucous membranes are moist.  Eyes:     Pupils: Pupils are equal, round, and reactive to light.  Neck:     Vascular: Hepatojugular reflux and JVD present.  Cardiovascular:     Rate and Rhythm: Normal rate and regular rhythm.     Heart sounds: No murmur heard. Pulmonary:     Effort: Pulmonary effort is normal. No respiratory distress.     Breath sounds: Examination of the right-upper field reveals wheezing. Examination of the left-upper field reveals wheezing. Examination of the right-lower field reveals rales. Examination of the left-lower field reveals rales. Wheezing and rales present.  Abdominal:     General: Abdomen is flat.     Palpations: Abdomen is soft.     Tenderness: There is no abdominal tenderness.  Musculoskeletal:        General: No tenderness.     Right lower leg: Edema present.     Left lower leg: Edema present.  Skin:    General: Skin is warm and dry.  Neurological:     General: No focal deficit present.     Mental Status: She is alert. Mental status is at baseline.  Psychiatric:        Mood and Affect: Mood normal.        Behavior: Behavior normal.     ED Results and  Treatments Labs (all labs ordered are listed, but only abnormal results are displayed) Labs Reviewed  COMPREHENSIVE METABOLIC PANEL WITH GFR - Abnormal; Notable for the following components:      Result Value   Glucose, Bld 106 (*)    Creatinine, Ser 1.68 (*)    GFR, Estimated 31 (*)    All other components within normal limits  CBC WITH DIFFERENTIAL/PLATELET - Abnormal; Notable for the following components:   RDW 16.0 (*)    Platelets 130 (*)    All other components within normal limits  BRAIN NATRIURETIC PEPTIDE - Abnormal; Notable for the following components:   B Natriuretic Peptide 528.0 (*)    All other components within normal limits  BLOOD GAS, VENOUS - Abnormal; Notable for the following components:   pH, Ven 7.44 (*)    pCO2, Ven 38 (*)    Acid-Base Excess 2.3 (*)    All other components within normal limits  RESP PANEL BY RT-PCR (RSV, FLU A&B, COVID)  RVPGX2  TROPONIN I (HIGH SENSITIVITY)  TROPONIN I (HIGH SENSITIVITY)                                                                                                                          Radiology DG Chest [facility]  1 View Result Date: 05/30/2024 EXAM: 1 VIEW XRAY OF THE CHEST 05/30/2024 07:46:00 AM COMPARISON: [facility] radiograph of the chest dated 03/14/2024. CLINICAL HISTORY: Pt c/o increased SHOB this morning compared to usual per spouse. Pt has hx of CHF and asthma. Pt states she takes lasix . Pt also c/o mid sternal chest pain and upper abdominal pain. FINDINGS: LUNGS AND PLEURA: Prominent particulate opacities within the mid and lower lung zones bilaterally. Both hemidiaphragms are partially obscured. HEART AND MEDIASTINUM: The heart is enlarged. The patient is status post 2 separate cardiac valve repairs. BONES AND SOFT TISSUES: Surgical clips present in the right chest wall. No acute osseous abnormality. IMPRESSION: 1. Prominent particulate opacities within the mid and lower lung zones bilaterally, partially obscuring both  hemidiaphragms. 2. Enlarged heart with status post 2 separate cardiac valve repairs. Electronically signed by: Evalene Coho MD 05/30/2024 07:59 AM EDT RP Workstation: [id number]    Pertinent labs & imaging results that were available during my care of the patient were reviewed by me and considered in my medical decision making (see MDM for details).  Medications Ordered in ED Medications  ipratropium-albuterol  (DUONEB) 0.5-2.5 (3) MG/3ML nebulizer solution 3 mL (3 mLs Nebulization Given 05/30/24 0746)  furosemide  (LASIX ) injection 40 mg (40 mg Intravenous Given 05/30/24 0837)                                                                                                                                     Procedures Procedures  (including critical care time)  Medical Decision Making / ED Course   MDM:  78 year old presenting to the emergency department shortness of breath.  Patient overall well-appearing, not hypoxic on room air, borderline low.  Exam with some trace wheezes in upper lung fields and some crackles in the lower lung fields.  Suspect primary cause is likely CHF related, wheezing is fairly trace with good air movement.  Does have lower extremity edema, some JVD.  Chest x-ray without focal infiltrate to suggest pneumonia and no leukocytosis.  Will check flu and COVID swab.  No evidence of pneumothorax.  Patient is on chronic warfarin therapy and denies pleuritic pain, is no tachycardia so doubt pulmonary embolism will reassess.  Could possibly be discharged to home if feeling symptomatic improvement after treatment.  Clinical Course as of 05/31/24 0732  Thu May 30, 2024  1020 Patient temperature: 36.7 [WS]  1100 Patient has diuresed almost 2 L of urine. She reports she feels much better. She alternates torsemide  dosing every other day or so at home, so recommended increasing to higher dose of her torsemide  for next 3-4 days. Placed referral to cardiology for expedited  follow up for re-check of volume status. Will discharge patient to home. All questions answered. Patient comfortable with plan of discharge. Return precautions discussed with patient and specified on the after visit summary.  [WS]    Clinical Course User Index [WS] Kyriaki Moder L,  MD     Additional history obtained: -Additional history obtained from spouse -External records from outside source obtained and reviewed including: Chart review including previous notes, labs, imaging, consultation notes including prior cardiology notes    Lab Tests: -I ordered, reviewed, and interpreted labs.   The pertinent results include:   Labs Reviewed  COMPREHENSIVE METABOLIC PANEL WITH GFR - Abnormal; Notable for the following components:      Result Value   Glucose, Bld 106 (*)    Creatinine, Ser 1.68 (*)    GFR, Estimated 31 (*)    All other components within normal limits  CBC WITH DIFFERENTIAL/PLATELET - Abnormal; Notable for the following components:   RDW 16.0 (*)    Platelets 130 (*)    All other components within normal limits  BRAIN NATRIURETIC PEPTIDE - Abnormal; Notable for the following components:   B Natriuretic Peptide 528.0 (*)    All other components within normal limits  BLOOD GAS, VENOUS - Abnormal; Notable for the following components:   pH, Ven 7.44 (*)    pCO2, Ven 38 (*)    Acid-Base Excess 2.3 (*)    All other components within normal limits  RESP PANEL BY RT-PCR (RSV, FLU A&B, COVID)  RVPGX2  TROPONIN I (HIGH SENSITIVITY)  TROPONIN I (HIGH SENSITIVITY)    Notable for normal troponin, elevated BNP, CKD  EKG   EKG Interpretation Date/Time:  Thursday May 30 2024 07:19:51 EDT Ventricular Rate:  63 PR Interval:  324 QRS Duration:  99 QT Interval:  497 QTC Calculation: 509 R Axis:   142  Text Interpretation: Sinus rhythm Prolonged PR interval Right ventricular hypertrophy Nonspecific T abnrm, anterolateral leads No significant change since last tracing  Confirmed by Francesca Fallow ([id number]) on 05/30/2024 11:57:32 AM         Imaging Studies ordered: I ordered imaging studies including CXR On my interpretation imaging demonstrates CHF I independently visualized and interpreted imaging. I agree with the radiologist interpretation   Medicines ordered and prescription drug management: Meds ordered this encounter  Medications   ipratropium-albuterol  (DUONEB) 0.5-2.5 (3) MG/3ML nebulizer solution 3 mL   furosemide  (LASIX ) injection 40 mg    -I have reviewed the patients home medicines and have made adjustments as needed   Reevaluation: After the interventions noted above, I reevaluated the patient and found that their symptoms have improved  Co morbidities that complicate the patient evaluation  Past Medical History:  Diagnosis Date   Anemia    Arrhythmia 2020?   Asthma    Atrial fibrillation (HCC)    Breast carcinoma (HCC) 1995   1995   Cardioembolic stroke (HCC) 01/2012   Right frontal in 01/2012; normal carotid ultrasound; possible LAA thrombus by TEE; virtual complete neurologic recovery   Carotid artery occlusion 1961   CHF (congestive heart failure) (HCC) 5+ years   Chronic kidney disease, stage 2, mildly decreased GFR    GFR of approximately 60   Diabetes mellitus without complication (HCC)    Diverticulosis of colon (without mention of hemorrhage) 2012   Dr. Golda   Dysrhythmia    Fasting hyperglycemia    120 fasting   Gastroesophageal reflux disease    Gastroparesis    Heart murmur 1961   Hemorrhoids    Hyperlipidemia    Hypertension    pt denies 05/30/13     Dr Alvan chester   Hyponatremia    Hypothyroidism    Rheumatic heart disease    a. 07/2013 Echo: Ef 55-60%,  Mod AS/AI, Mod-Sev MS, sev dil LA, PASP 58;  b. 07/2013 TEE: EF 35-40%, mild-mod AS/AI, mod-sev MS, LA smoke;  c. 07/2013 Cath: elev R heart pressures, nl cors.   Shortness of breath    Syncope and collapse February, 2024       Dispostion: Disposition decision including need for hospitalization was considered, and patient discharged from emergency department.    Final Clinical Impression(s) / ED Diagnoses Final diagnoses:  Acute congestive heart failure, unspecified heart failure type [facility])     This chart was dictated using voice recognition software.  Despite best efforts to proofread,  errors can occur which can change the documentation meaning.    Francesca Elsie CROME, MD 05/31/24 [phone]

## 2024-05-30 NOTE — Discharge Instructions (Signed)
 We evaluated you for your shortness of breath.  Your testing shows that your shortness of breath is due to an exacerbation of heart failure.  We would recommend that you increase your diuretic for the next few days.  Please take 40 mg of torsemide  daily for the next 5 days.  Please return if you have any new or worsening symptoms.  We have placed a referral for Dr. Alvan for you to get follow-up with.

## 2024-05-30 NOTE — ED Triage Notes (Signed)
 Pt c/o increased SHOB this morning compared to usual per spouse. Pt has hx of CHF and asthma. Pt states she takes lasix . Pt also c/o mid sternal chest pain and upper abdominal pain.

## 2024-06-09 NOTE — Progress Notes (Unsigned)
 [name], female    DOB: 1946/01/13      MRN: [id number]   Brief patient profile:  78 yowf never smoker with a h/o sob related to asthma and valvular heart dz s/p MVR and AVR 2014   Seen prior to Epic in [place]: Feb 20, 2009 initial pulmonary eval started aerobics 3 months prior to OV  X 30 min weekly and tolerated well with improving ex tol until 3 weeks prior to OV   noted decrease tolerance and since then gradually worse to point where walking to mailbox slt uphill or climbing steps has to stop at top.  In retrospect coughing for 3 months dry, worse immediately on lying down. due to the cough lisnopril was stopped and could not tolerate the substitute and cough hasn't changed but breathing got worse. prednisone  no help so far and proaire no benefit. rec GERD diet   continue prilosec and reglan  stop prednisone  and proaire  avoid salt  maxzide 25 one each am and Dr Ceasar office   10/16/12 Meth challenge  + for hyperreactive airways disease  clance eval 05/14/13 [name] that at this point is clearly upper airway in origin.  [name] , but her current cough failed to respond to prednisone  tapers.  Therefore it is unlikely that it is related to cough variant asthma.  She does not have chronic sinusitis by her CT scan, and there is nothing on chest x-ray earlier in the year to explain her cough.  She does have a history of breast cancer, but there has been no evidence for recurrence.  [name], but this has an extremely low yield.  [name], and therefore I am concerned she may be having laryngopharyngeal reflux as a cause for her cough.  I also think she may have a cyclical mechanism as well she did have some response to tramadol , and we could consider a trial of gabapentin   AVR  07/18/13   04/2020 echo  1. Left ventricular ejection fraction, by  estimation, is 60 to 65%. The  left ventricle has normal function. The left ventricle has no regional  wall motion abnormalities. Left ventricular diastolic parameters are  indeterminate.   2. Right ventricular systolic function is low normal. The right  ventricular size is normal. There is moderately elevated pulmonary artery  systolic pressure. The estimated right ventricular systolic pressure is  45.5 mmHg.   3. Left atrial size was mildly dilated.   4. The mitral valve has been repaired/replaced. No evidence of mitral  valve regurgitation. The mean mitral valve gradient is 5.0 mmHg. There is  a 25 mm Magna Ease bioprosthetic valve present in the mitral position.  Grossly normal function.   5. The aortic valve has been repaired/replaced. Aortic valve  regurgitation is not visualized. There is a 21 mm Magna Ease bioprosthetic  valve present in the aortic position. Aortic valve mean gradient measures  7.0 mmHg. Grossly normal function.   6. The inferior vena cava is normal in size with greater than 50%  respiratory variability, suggesting right atrial pressure of 3 mmHg.        History of Present Illness  06/23/2020  Pulmonary/ 1st office eval/ [facility] Colaiz[facility] / [facility] / re establish re cough and sob on flovent  44 2bid  For cough variant asthma  Chief Complaint  Patient presents with   Consult  Patient has a dry cough that has been going on for many years but worse over the last 6 months. Shortness of breath with exertion.   Dyspnea:  Walks dog x 30 min / mostly flat uses cane to balance/ steps ok slowly due to breathing problems x sev years prior to OV   Cough: worse in afternoons and sometimes hs has to sit up 30 degrees to prevent cough supine  Sleep: ok as long as head is up 30 degrees new x 2 years  SABA use: sev times a day seems to help at 4 pm rec GERD diet  Pantoprazole  (protonix ) 40 mg   Take  30-60 min before first meal of the day and Pepcid  (famotidine )  20 mg one  after supper until return to office - this is the best way to tell whether stomach acid is contributing to your problem.   Plan A = Automatic = Always=    flovent  Take 2 puffs first thing in am and then another 2 puffs about 12 hours later.  Work on inhaler technique:  Plan B = Backup (to supplement plan A, not to replace it) Only use your albuterol  inhaler as a rescue medication   Please schedule a follow up office visit in 6 weeks, call sooner if needed - bring inhalers  Next step ? Gabapentin ?    07/22/2021  f/u ov/Nayali Talerico re: cough > sob   maint on flovent   for dx of MCT pos cough variant asthma / stopped gabapentin  ? Why ? Chief Complaint  Patient presents with   Follow-up    Productive cough with thick white mucus   Dyspnea:  mailbox and back is 200 yards slow pace Cough: daytime cough / lots of throat clearing  Sleeping: 30 degrees ok  SABA use: using saba bid/ poor flovent  use  02: none  Covid status:   vax x 5  Rec Pantoprazole  (protonix ) 40 mg   Take  30-60 min before first meal of the day and Pepcid  (famotidine )  20 mg after supper until return to office   Gabapentin  100 mg four times daily  Plan A = Automatic = Always=   Symbicort  80 mg  Take 2 puffs first thing in am and then another 2 puffs about 12 hours later. (Stop flovent  and atrovent - unless can't afford symbicort  on your plan) Plan B = Backup (to supplement plan A, not to replace it) Only use your albuterol  inhaler as a rescue medication   Plan C = Crisis (instead of Plan B but only if Plan B stops working) - only use your albuterol  nebulizer if you first try Plan B   For cough > mucinex  dm 1200 every 12 hours as needed    02/10/2023  f/u ov/[place] [facility]/Kynzli Rease re: cough variant asthma  maint on bud 0.5 mg bid and albuterol  per neb   Chief Complaint  Patient presents with   Follow-up    SOB with exertion, prod cough with thick white sputum and wheezing.  Dyspnea: able to walk  stop sign and back 400 meters round  trip  Cough: worse lately with subjective  wheezing > no purulent sputum Sleeping: 30 degrees hob/ no resp cc  SABA use: neb doesn't help  02: none  Rec Increase the gabapentin  (100 x 2) three x daily(600 total)  until use up and if tolerated then start 300 mg  twice daily (600 mg total)  Depomedrol 120 mg IM  Plan A = Automatic = Always=  Formoterol  20 mcg/ budesonide  0.50 twice daily  Plan B = Backup (to supplement plan A, not to replace it) Only use your albuterol  nebulizer  as a rescue medication   09/21/2023  f/u ov/[place] [facility]/Bona Hubbard re: UACS vs asthma  maint on symbicort  80  Chief Complaint  Patient presents with   Cough   Shortness of Breath  Dyspnea:  if anything walking faster  Cough: better but still has sense of globus seems worse x the last 3 weeks prior to OV   Sleeping: 30 degrees hob s    resp cc  SABA use: rarely needs  02: no  Rec Increase gabapentin  to 300 mg three times daily with meals  My office will be contacting you by phone for referral to ENT  11/27/23 pos gerd/ rhinitis rx topicals    Labs 5/12 /25 IgE  37 with neg RAST and Rx trelegy per [facility] NP    05/13/2024  f/u ov/[place] office/Ethelyn Cerniglia re:  UACS vs asthma(+MCT)  maint on trelegy no better breathing and cough worse Chief Complaint  Patient presents with   Cough    wet cough - white thick mucus shob  Dyspnea:  no change on trelegy Cough: throat clearing mostly daytime/ min mucoid production Sleeping: 30 degrees/ 2 pillows s resp cc  SABA use: prn use up on trelegy  02: none  Rec Symbicort  80 one puff twice daily via spacer  Prednisone  10 mg take  4 each am x 2 days,   2 each am x 2 days,  1 each am x 2 days and stop  Stop the Trelegy and only use albuterol  as needed if you can't catch your breath  Please schedule a follow up office visit in 4 weeks, sooner if needed with symbicort  80 and spacer.    06/10/2024  post ER f/u ov/[place] [facility]/Scotti Kosta re:  UACS vs asthma(+MCT)  maint on  symbicicort  with poor hfa even with spacer/ refuses neb  Chief Complaint  Patient presents with   Cough    Cough Is worse   Dyspnea:  no better  Cough: none  Sleeping: 30 degrees / 2pillows s  noct or am   resp cc  SABA use: rarely  02: none    No obvious day to day or daytime variability or assoc excess/ purulent sputum or mucus plugs or hemoptysis or cp or chest tightness, subjective wheeze or overt sinus or hb symptoms.    Also denies any obvious fluctuation of symptoms with weather or environmental changes or other aggravating or alleviating factors except as outlined above   No unusual exposure hx or h/o childhood pna/ asthma or knowledge of premature birth.  Current Allergies, Complete Past Medical History, Past Surgical History, Family History, and Social History were reviewed in [employer] record.  ROS  The following are not active complaints unless bolded Hoarseness, sore throat, dysphagia, dental problems, itching, sneezing,  nasal congestion or discharge of excess mucus or purulent secretions, ear ache,   fever, chills, sweats, unintended wt loss or wt gain, classically pleuritic or exertional cp,  orthopnea pnd or arm/hand swelling  or leg swelling, presyncope, palpitations, abdominal pain, anorexia, nausea, vomiting, diarrhea  or change in bowel habits or change in bladder habits, change in stools or change in urine, dysuria, hematuria,  rash, arthralgias, visual complaints, headache, numbness, weakness or ataxia or problems with walking or coordination,  change in mood or  memory.        Current Meds  Medication Sig   acetaminophen  (TYLENOL ) 325 MG tablet Take 2 tablets (650 mg total) by mouth every 6 (six) hours as needed. (Patient taking differently: Take 650 mg by mouth as needed.)   albuterol  (VENTOLIN  HFA) 108 (90 Base) MCG/ACT inhaler Inhale 2 puffs into the lungs every 6 (six) hours as needed for wheezing or shortness of breath.   amiodarone   (PACERONE ) 200 MG tablet Take 1 tablet (200 mg total) by mouth daily.   atorvastatin  (LIPITOR) 40 MG tablet TAKE ONE TABLET BY MOUTH ONCE DAILY.   azelastine  (ASTELIN ) 0.1 % nasal spray Place 1 spray into both nostrils 2 (two) times daily. Use in each nostril as directed   bisoprolol  (ZEBETA ) 5 MG tablet Take 1 tablet (5 mg total) by mouth daily.   budesonide -formoterol  (SYMBICORT ) 80-4.5 MCG/ACT inhaler One puff first thing in am and 12 hours later   escitalopram  (LEXAPRO ) 10 MG tablet Take 10 mg by mouth daily with breakfast.   famotidine  (PEPCID ) 20 MG tablet TAKE ONE TABLET BY MOUTH EVERY DAY AFTER SUPPER   FARXIGA  10 MG TABS tablet Take 10 mg by mouth daily.   fexofenadine (ALLEGRA) 180 MG tablet Take 180 mg by mouth in the morning.   fluticasone  (FLONASE ) 50 MCG/ACT nasal spray Place 1 spray into both nostrils daily.   levothyroxine  (SYNTHROID ) 100 MCG tablet Take 100 mcg by mouth daily before breakfast.   losartan  (COZAAR ) 25 MG tablet Take 25 mg by mouth daily.   metFORMIN  (GLUCOPHAGE ) 500 MG tablet Take 1 tablet (500 mg total) by mouth 2 (two) times daily with a meal.   Multiple Vitamin (MULTIVITAMIN WITH MINERALS) TABS tablet Take 1 tablet by mouth daily.   pantoprazole  (PROTONIX ) 40 MG tablet TAKE 1 TABLET 30 TO 60 MINUTES BEFORE FIRST MEAL OF THE DAY.   Polyethyl Glycol-Propyl Glycol (SYSTANE OP) Place 1 drop into both eyes daily as needed (dry eyes).   potassium chloride  SA (KLOR-CON  M) 20 MEQ tablet Take 1 tablet (20 mEq total) by mouth daily.   torsemide  (DEMADEX ) 20 MG tablet Take 20mg  (1 tab) by mouth every other day alternating with 40mg  (2 tabs) every other day   warfarin (COUMADIN ) 2.5 MG tablet TAKE AS DIRECTED BY COUMADIN  CLINIC. (Patient taking differently: Take 2.5 mg by mouth See admin instructions. TAKE AS DIRECTED BY COUMADIN  CLINIC.Take 1 tablet every day except 1/2 tablet on Thursday and Sunday)                    Past Medical History:  Diagnosis Date   Anemia     Atrial fibrillation (HCC)    Breast carcinoma (HCC) 1995   1995   Cardioembolic stroke (HCC) 01/2012   Right frontal in 01/2012; normal carotid ultrasound; possible LAA thrombus by TEE; virtual complete neurologic recovery   Chronic kidney disease, stage 2, mildly decreased GFR    GFR of approximately 60   Depression    Diverticulosis of colon (without mention of hemorrhage) 2012   Dr. Golda   Fasting hyperglycemia    120 fasting   Gastroesophageal reflux disease    Gastroparesis    Hemorrhoids    Hyperlipidemia    Hypertension    pt denies 05/30/13     Dr Alvan chester   Hyponatremia    Rheumatic heart disease    a. 07/2013 Echo: Ef 55-60%, Mod AS/AI, Mod-Sev MS, sev dil LA, PASP 58;  b. 07/2013 TEE: EF 35-40%, mild-mod AS/AI, mod-sev MS, LA smoke;  c. 07/2013 Cath:  elev R heart pressures, nl cors.   Shortness of breath     Past Medical History:    Hx of NEOPLASM, MALIGNANT, BREAST (ICD-174.9)    HYPERLIPIDEMIA (ICD-272.4)    ASTHMA (ICD-493.90)    GERD with gastroparesis   Past Surgical History:    Breast surgery 1995    Heart valve surgery 1962 age 21 [facility] Mitral valve     Tubal ligation-1973   Objective:    Wt  06/10/2024           156   05/13/2024         160  09/21/2023      169  08/10/2023         169  02/10/2023        158  07/25/2022      167 04/20/2022        170  01/21/2022        177  12/13/2021        177 09/14/2021      170  07/22/2021      162  10/29/2020        177 09/16/2020      178   08/05/20 178 lb 12.8 oz (81.1 kg)  06/25/20 194 lb (88 kg)  06/23/20 180 lb (81.6 kg)      Vital signs reviewed  06/10/2024  - Note at rest 02 sats  95% on RA   General appearance:    somber amb wf    A few insp squeaks / 1+ pitting 2-3/6 sem               Assessment

## 2024-06-10 ENCOUNTER — Ambulatory Visit: Admitting: Internal Medicine

## 2024-06-10 ENCOUNTER — Encounter: Payer: Self-pay | Admitting: Internal Medicine

## 2024-06-10 VITALS — BP 106/57 | HR 67 | Ht 63.0 in | Wt 156.2 lb

## 2024-06-10 DIAGNOSIS — J45991 Cough variant asthma: Secondary | ICD-10-CM

## 2024-06-10 DIAGNOSIS — R0609 Other forms of dyspnea: Secondary | ICD-10-CM

## 2024-06-10 DIAGNOSIS — R058 Other specified cough: Secondary | ICD-10-CM

## 2024-06-10 NOTE — Patient Instructions (Signed)
 Ok to change plan A to trelegy one click each am for a week then symbicort  80 2 puffs every 12hours via spacer to see which one helps the breathing/ coughing more  If mouth/throat irritated by trelegy then brush teeth after use with [employer] toothpaste and make sllurry/ gargle with it daily   Please schedule a follow up visit in 6  months but call sooner if needed

## 2024-06-15 NOTE — Assessment & Plan Note (Addendum)
 Original onset 2010 on ACEi  Spiro 09/2012: normal  Sinus ct 2014:  No chronic sinus disease. 10/16/12 Meth challenge  + for hyperreactive airways disease 04/30/20 CT head sinus nl - 06/23/2020  After extensive coaching inhaler device,  effectiveness =    75% from a baseline of 25% > continue flovent  44 2bid and prn saba plus max gerd rx  08/05/2020    changed to symb 80 2bid and given depomedrol 120 mg IM  > no better - 09/16/2020  After extensive coaching inhaler device,  effectiveness =    50% baseline of less than 25% - 09/16/2020 started gabapentin  titrate up to 100 mg qid  -  11/26/20  Sinus CT neg  - Allergy profile 07/22/2021 >  Eos 0.2 /  IgE  15 - 09/14/2021  After extensive coaching inhaler device,  effectiveness =    0% so changed to alb/bud bid > 12/13/2021 did not follow instructions so try again same rx  With  Prednisone  10 mg take  4 each am x 2 days,   2 each am x 2 days,  1 each am x 2 days and stop and bud 0.25 mg bid combined with albuterol  since can't afford performist on her plan. - FENO 02/10/2023  =29  on bud 0.25 mg bid > added formoterol  20 mcg bid and increased  budesonide  0.5 mg bid  - 02/10/2023 try increase gabapentin  to 300 mg bid > increased 300 mg  tid 09/21/2023 due to globus  - 09/21/2023  After extensive coaching inhaler device,  effectiveness =    75% with with spacer on 3rd try  - 09/21/2023  increased gabapentin  to 300 mg tid > could not tol  -  09/21/2023 referred to ENT > no new findings  gerd/ rhinitis -  05/13/2024 cc cough worse on trelegy > change back to symb 80 one bid via spacer and f/u on 4 weeks plus 6 d prednisone  and d/c trelegy  >>> 06/10/2024 not clear which prefers, trelegy 100 vs symb 80 so ok to try each for a week

## 2024-06-15 NOTE — Assessment & Plan Note (Addendum)
 Never smoker  - Spirometry 10/16/2012  FEV1 1.41 (63%)  Ratio 0.65 classic mild concavity   12/13/2021   Walked on RA  x  3  lap(s) =  approx 450  ft  @ slow pace, stopped due to end of study/ sob with lowest 02 sats 93%  - Echo 04/01/22   1. Left ventricular ejection fraction, by estimation, is 60%. The left  ventricle has normal function. The left ventricle has no regional wall  motion abnormalities. Left ventricular diastolic function could not be  evaluated.   2. Caclified papillary muscles.   3. Right ventricular systolic function is normal. The right ventricular  size is normal. There is normal pulmonary artery systolic pressure.   4. Left atrial size was moderately dilated. 04/20/2022   Walked on RA  x  3  lap(s) =  approx 450   ft  @ moderate/cane pace, stopped due to end of study with lowest 02 sats 91% and mild sob starting on 2nd lap  - 08/10/2023 patient walked 2 laps at a slow pace with cane; patient declined 3rd lap d/t shob/fatigue with lowest sats 90% - 05/13/2024   Walked on RA  x  2  lap(s) =  approx 300  ft  @ slow pace, stopped due to sob with lowest 02 sats 97%    Refuses to use neb so choice is hfa vs DPI  > ok to use symbicort  80 x 2 bid via spacer  for a week then trelegy 100 for a week and decide which one works better for doe/cough with [employer] toothpaste for any mouth irritation p using either device.        Each maintenance medication was reviewed in detail including emphasizing most importantly the difference between maintenance and prns and under what circumstances the prns are to be triggered using an action plan format where appropriate.  Total time for H and P, chart review, counseling, reviewing hfa/ / DPI  device(s) and generating customized AVS unique to this office visit / same day charting = 25 min

## 2024-06-17 ENCOUNTER — Ambulatory Visit: Payer: Self-pay | Admitting: Cardiology

## 2024-06-28 ENCOUNTER — Other Ambulatory Visit: Payer: Self-pay

## 2024-06-28 ENCOUNTER — Emergency Department (HOSPITAL_COMMUNITY)

## 2024-06-28 ENCOUNTER — Observation Stay (HOSPITAL_COMMUNITY)

## 2024-06-28 ENCOUNTER — Inpatient Hospital Stay (HOSPITAL_COMMUNITY)
Admission: EM | Admit: 2024-06-28 | Discharge: 2024-07-10 | DRG: 041 | Disposition: A | Discharge status: Home Health | Attending: Internal Medicine | Admitting: Internal Medicine

## 2024-06-28 ENCOUNTER — Encounter (HOSPITAL_COMMUNITY): Payer: Self-pay

## 2024-06-28 DIAGNOSIS — S0232XA Fracture of orbital floor, left side, initial encounter for closed fracture: Secondary | ICD-10-CM | POA: Diagnosis present

## 2024-06-28 DIAGNOSIS — E785 Hyperlipidemia, unspecified: Secondary | ICD-10-CM | POA: Diagnosis present

## 2024-06-28 DIAGNOSIS — I10 Essential (primary) hypertension: Secondary | ICD-10-CM | POA: Diagnosis not present

## 2024-06-28 DIAGNOSIS — M48061 Spinal stenosis, lumbar region without neurogenic claudication: Secondary | ICD-10-CM | POA: Diagnosis not present

## 2024-06-28 DIAGNOSIS — R29810 Facial weakness: Secondary | ICD-10-CM | POA: Diagnosis not present

## 2024-06-28 DIAGNOSIS — W010XXA Fall on same level from slipping, tripping and stumbling without subsequent striking against object, initial encounter: Secondary | ICD-10-CM | POA: Diagnosis present

## 2024-06-28 DIAGNOSIS — H918X9 Other specified hearing loss, unspecified ear: Secondary | ICD-10-CM | POA: Diagnosis present

## 2024-06-28 DIAGNOSIS — Z9181 History of falling: Secondary | ICD-10-CM

## 2024-06-28 DIAGNOSIS — Z7901 Long term (current) use of anticoagulants: Secondary | ICD-10-CM

## 2024-06-28 DIAGNOSIS — Z953 Presence of xenogenic heart valve: Secondary | ICD-10-CM

## 2024-06-28 DIAGNOSIS — R55 Syncope and collapse: Secondary | ICD-10-CM | POA: Diagnosis present

## 2024-06-28 DIAGNOSIS — I4892 Unspecified atrial flutter: Secondary | ICD-10-CM | POA: Diagnosis not present

## 2024-06-28 DIAGNOSIS — R040 Epistaxis: Secondary | ICD-10-CM | POA: Diagnosis present

## 2024-06-28 DIAGNOSIS — E039 Hypothyroidism, unspecified: Secondary | ICD-10-CM | POA: Diagnosis present

## 2024-06-28 DIAGNOSIS — I5032 Chronic diastolic (congestive) heart failure: Secondary | ICD-10-CM | POA: Diagnosis present

## 2024-06-28 DIAGNOSIS — Z741 Need for assistance with personal care: Secondary | ICD-10-CM | POA: Diagnosis present

## 2024-06-28 DIAGNOSIS — I130 Hypertensive heart and chronic kidney disease with heart failure and stage 1 through stage 4 chronic kidney disease, or unspecified chronic kidney disease: Secondary | ICD-10-CM | POA: Diagnosis present

## 2024-06-28 DIAGNOSIS — Z8261 Family history of arthritis: Secondary | ICD-10-CM

## 2024-06-28 DIAGNOSIS — M5021 Other cervical disc displacement,  high cervical region: Secondary | ICD-10-CM | POA: Diagnosis not present

## 2024-06-28 DIAGNOSIS — I609 Nontraumatic subarachnoid hemorrhage, unspecified: Secondary | ICD-10-CM | POA: Diagnosis not present

## 2024-06-28 DIAGNOSIS — S06359A Traumatic hemorrhage of left cerebrum with loss of consciousness of unspecified duration, initial encounter: Secondary | ICD-10-CM | POA: Diagnosis not present

## 2024-06-28 DIAGNOSIS — K219 Gastro-esophageal reflux disease without esophagitis: Secondary | ICD-10-CM | POA: Diagnosis present

## 2024-06-28 DIAGNOSIS — S022XXA Fracture of nasal bones, initial encounter for closed fracture: Secondary | ICD-10-CM | POA: Diagnosis present

## 2024-06-28 DIAGNOSIS — I611 Nontraumatic intracerebral hemorrhage in hemisphere, cortical: Secondary | ICD-10-CM | POA: Diagnosis not present

## 2024-06-28 DIAGNOSIS — S0990XA Unspecified injury of head, initial encounter: Secondary | ICD-10-CM | POA: Diagnosis not present

## 2024-06-28 DIAGNOSIS — N1832 Chronic kidney disease, stage 3b: Secondary | ICD-10-CM | POA: Diagnosis present

## 2024-06-28 DIAGNOSIS — S299XXA Unspecified injury of thorax, initial encounter: Secondary | ICD-10-CM | POA: Diagnosis not present

## 2024-06-28 DIAGNOSIS — M47812 Spondylosis without myelopathy or radiculopathy, cervical region: Secondary | ICD-10-CM | POA: Diagnosis not present

## 2024-06-28 DIAGNOSIS — D6869 Other thrombophilia: Secondary | ICD-10-CM | POA: Diagnosis present

## 2024-06-28 DIAGNOSIS — E1165 Type 2 diabetes mellitus with hyperglycemia: Secondary | ICD-10-CM | POA: Diagnosis present

## 2024-06-28 DIAGNOSIS — R710 Precipitous drop in hematocrit: Secondary | ICD-10-CM | POA: Diagnosis not present

## 2024-06-28 DIAGNOSIS — Y92511 Restaurant or cafe as the place of occurrence of the external cause: Secondary | ICD-10-CM

## 2024-06-28 DIAGNOSIS — M5030 Other cervical disc degeneration, unspecified cervical region: Secondary | ICD-10-CM | POA: Diagnosis present

## 2024-06-28 DIAGNOSIS — S060X9A Concussion with loss of consciousness of unspecified duration, initial encounter: Secondary | ICD-10-CM | POA: Diagnosis not present

## 2024-06-28 DIAGNOSIS — Z8249 Family history of ischemic heart disease and other diseases of the circulatory system: Secondary | ICD-10-CM

## 2024-06-28 DIAGNOSIS — Z7984 Long term (current) use of oral hypoglycemic drugs: Secondary | ICD-10-CM

## 2024-06-28 DIAGNOSIS — M25511 Pain in right shoulder: Secondary | ICD-10-CM | POA: Diagnosis present

## 2024-06-28 DIAGNOSIS — H40053 Ocular hypertension, bilateral: Secondary | ICD-10-CM | POA: Diagnosis present

## 2024-06-28 DIAGNOSIS — Z833 Family history of diabetes mellitus: Secondary | ICD-10-CM

## 2024-06-28 DIAGNOSIS — Z79890 Hormone replacement therapy (postmenopausal): Secondary | ICD-10-CM

## 2024-06-28 DIAGNOSIS — S01511A Laceration without foreign body of lip, initial encounter: Secondary | ICD-10-CM | POA: Diagnosis present

## 2024-06-28 DIAGNOSIS — K3184 Gastroparesis: Secondary | ICD-10-CM | POA: Diagnosis present

## 2024-06-28 DIAGNOSIS — Z853 Personal history of malignant neoplasm of breast: Secondary | ICD-10-CM

## 2024-06-28 DIAGNOSIS — S06321A Contusion and laceration of left cerebrum with loss of consciousness of 30 minutes or less, initial encounter: Secondary | ICD-10-CM

## 2024-06-28 DIAGNOSIS — M549 Dorsalgia, unspecified: Secondary | ICD-10-CM | POA: Diagnosis present

## 2024-06-28 DIAGNOSIS — I5020 Unspecified systolic (congestive) heart failure: Secondary | ICD-10-CM

## 2024-06-28 DIAGNOSIS — R410 Disorientation, unspecified: Secondary | ICD-10-CM | POA: Diagnosis not present

## 2024-06-28 DIAGNOSIS — W19XXXA Unspecified fall, initial encounter: Secondary | ICD-10-CM | POA: Diagnosis not present

## 2024-06-28 DIAGNOSIS — I615 Nontraumatic intracerebral hemorrhage, intraventricular: Secondary | ICD-10-CM | POA: Diagnosis not present

## 2024-06-28 DIAGNOSIS — S061X9A Traumatic cerebral edema with loss of consciousness of unspecified duration, initial encounter: Secondary | ICD-10-CM | POA: Diagnosis present

## 2024-06-28 DIAGNOSIS — Z952 Presence of prosthetic heart valve: Secondary | ICD-10-CM | POA: Diagnosis not present

## 2024-06-28 DIAGNOSIS — S0993XA Unspecified injury of face, initial encounter: Secondary | ICD-10-CM | POA: Diagnosis not present

## 2024-06-28 DIAGNOSIS — Z825 Family history of asthma and other chronic lower respiratory diseases: Secondary | ICD-10-CM

## 2024-06-28 DIAGNOSIS — I099 Rheumatic heart disease, unspecified: Secondary | ICD-10-CM | POA: Diagnosis present

## 2024-06-28 DIAGNOSIS — S0181XD Laceration without foreign body of other part of head, subsequent encounter: Secondary | ICD-10-CM | POA: Diagnosis not present

## 2024-06-28 DIAGNOSIS — S0230XA Fracture of orbital floor, unspecified side, initial encounter for closed fracture: Secondary | ICD-10-CM

## 2024-06-28 DIAGNOSIS — Z7951 Long term (current) use of inhaled steroids: Secondary | ICD-10-CM

## 2024-06-28 DIAGNOSIS — Z471 Aftercare following joint replacement surgery: Secondary | ICD-10-CM | POA: Diagnosis not present

## 2024-06-28 DIAGNOSIS — S01512A Laceration without foreign body of oral cavity, initial encounter: Secondary | ICD-10-CM | POA: Diagnosis not present

## 2024-06-28 DIAGNOSIS — Z79899 Other long term (current) drug therapy: Secondary | ICD-10-CM

## 2024-06-28 DIAGNOSIS — I4819 Other persistent atrial fibrillation: Secondary | ICD-10-CM | POA: Diagnosis present

## 2024-06-28 DIAGNOSIS — I440 Atrioventricular block, first degree: Secondary | ICD-10-CM | POA: Diagnosis present

## 2024-06-28 DIAGNOSIS — R112 Nausea with vomiting, unspecified: Secondary | ICD-10-CM | POA: Diagnosis present

## 2024-06-28 DIAGNOSIS — S0181XA Laceration without foreign body of other part of head, initial encounter: Secondary | ICD-10-CM | POA: Diagnosis not present

## 2024-06-28 DIAGNOSIS — I2510 Atherosclerotic heart disease of native coronary artery without angina pectoris: Secondary | ICD-10-CM | POA: Diagnosis present

## 2024-06-28 DIAGNOSIS — E1143 Type 2 diabetes mellitus with diabetic autonomic (poly)neuropathy: Secondary | ICD-10-CM | POA: Diagnosis present

## 2024-06-28 DIAGNOSIS — D6832 Hemorrhagic disorder due to extrinsic circulating anticoagulants: Secondary | ICD-10-CM | POA: Diagnosis not present

## 2024-06-28 DIAGNOSIS — M4802 Spinal stenosis, cervical region: Secondary | ICD-10-CM | POA: Diagnosis not present

## 2024-06-28 DIAGNOSIS — S02832A Fracture of medial orbital wall, left side, initial encounter for closed fracture: Secondary | ICD-10-CM | POA: Diagnosis present

## 2024-06-28 DIAGNOSIS — Z8673 Personal history of transient ischemic attack (TIA), and cerebral infarction without residual deficits: Secondary | ICD-10-CM

## 2024-06-28 DIAGNOSIS — S0633AA Contusion and laceration of cerebrum, unspecified, with loss of consciousness status unknown, initial encounter: Secondary | ICD-10-CM | POA: Diagnosis not present

## 2024-06-28 DIAGNOSIS — M4804 Spinal stenosis, thoracic region: Secondary | ICD-10-CM | POA: Diagnosis not present

## 2024-06-28 DIAGNOSIS — E876 Hypokalemia: Secondary | ICD-10-CM | POA: Diagnosis present

## 2024-06-28 DIAGNOSIS — J4489 Other specified chronic obstructive pulmonary disease: Secondary | ICD-10-CM | POA: Diagnosis present

## 2024-06-28 DIAGNOSIS — T45515A Adverse effect of anticoagulants, initial encounter: Secondary | ICD-10-CM | POA: Diagnosis present

## 2024-06-28 DIAGNOSIS — S0240DA Maxillary fracture, left side, initial encounter for closed fracture: Secondary | ICD-10-CM | POA: Diagnosis present

## 2024-06-28 DIAGNOSIS — I480 Paroxysmal atrial fibrillation: Secondary | ICD-10-CM

## 2024-06-28 DIAGNOSIS — E1122 Type 2 diabetes mellitus with diabetic chronic kidney disease: Secondary | ICD-10-CM | POA: Diagnosis present

## 2024-06-28 DIAGNOSIS — M47814 Spondylosis without myelopathy or radiculopathy, thoracic region: Secondary | ICD-10-CM | POA: Diagnosis not present

## 2024-06-28 DIAGNOSIS — S06369A Traumatic hemorrhage of cerebrum, unspecified, with loss of consciousness of unspecified duration, initial encounter: Principal | ICD-10-CM | POA: Diagnosis present

## 2024-06-28 DIAGNOSIS — I4581 Long QT syndrome: Secondary | ICD-10-CM | POA: Diagnosis not present

## 2024-06-28 DIAGNOSIS — I4891 Unspecified atrial fibrillation: Secondary | ICD-10-CM | POA: Diagnosis not present

## 2024-06-28 DIAGNOSIS — H50632 Inferior rectus muscle entrapment, left eye: Secondary | ICD-10-CM | POA: Diagnosis present

## 2024-06-28 DIAGNOSIS — I619 Nontraumatic intracerebral hemorrhage, unspecified: Secondary | ICD-10-CM | POA: Diagnosis not present

## 2024-06-28 DIAGNOSIS — I445 Left posterior fascicular block: Secondary | ICD-10-CM | POA: Diagnosis present

## 2024-06-28 DIAGNOSIS — R918 Other nonspecific abnormal finding of lung field: Secondary | ICD-10-CM | POA: Diagnosis not present

## 2024-06-28 HISTORY — DX: Presence of xenogenic heart valve: Z95.3

## 2024-06-28 HISTORY — DX: Chronic diastolic (congestive) heart failure: I50.32

## 2024-06-28 HISTORY — DX: Unspecified atrial flutter: I48.92

## 2024-06-28 HISTORY — DX: Unspecified systolic (congestive) heart failure: I50.20

## 2024-06-28 HISTORY — DX: Occlusion and stenosis of unspecified carotid artery: I65.29

## 2024-06-28 LAB — I-STAT CG4 LACTIC ACID, ED: Lactic Acid, Venous: 1.9 mmol/L (ref 0.5–1.9)

## 2024-06-28 LAB — CBC
HCT: 39.9 % (ref 36.0–46.0)
Hemoglobin: 12.2 g/dL (ref 12.0–15.0)
MCH: 26.5 pg (ref 26.0–34.0)
MCHC: 30.6 g/dL (ref 30.0–36.0)
MCV: 86.7 fL (ref 80.0–100.0)
Platelets: 191 K/uL (ref 150–400)
RBC: 4.6 MIL/uL (ref 3.87–5.11)
RDW: 16.3 % — ABNORMAL HIGH (ref 11.5–15.5)
WBC: 8 K/uL (ref 4.0–10.5)
nRBC: 0 % (ref 0.0–0.2)

## 2024-06-28 LAB — I-STAT CHEM 8, ED
BUN: 22 mg/dL (ref 8–23)
Calcium, Ion: 1.01 mmol/L — ABNORMAL LOW (ref 1.15–1.40)
Chloride: 99 mmol/L (ref 98–111)
Creatinine, Ser: 1.9 mg/dL — ABNORMAL HIGH (ref 0.44–1.00)
Glucose, Bld: 122 mg/dL — ABNORMAL HIGH (ref 70–99)
HCT: 41 % (ref 36.0–46.0)
Hemoglobin: 13.9 g/dL (ref 12.0–15.0)
Potassium: 3.9 mmol/L (ref 3.5–5.1)
Sodium: 137 mmol/L (ref 135–145)
TCO2: 29 mmol/L (ref 22–32)

## 2024-06-28 LAB — SAMPLE TO BLOOD BANK

## 2024-06-28 LAB — ETHANOL: Alcohol, Ethyl (B): <15 mg/dL (ref <15)

## 2024-06-28 LAB — URINALYSIS, ROUTINE W REFLEX MICROSCOPIC
Bilirubin Urine: NEGATIVE
Glucose, UA: >=500 mg/dL — AB
Hgb urine dipstick: NEGATIVE
Ketones, ur: NEGATIVE mg/dL
Leukocytes,Ua: NEGATIVE
Nitrite: NEGATIVE
Protein, ur: NEGATIVE mg/dL
Specific Gravity, Urine: 1.002 — ABNORMAL LOW (ref 1.005–1.030)
pH: 7 (ref 5.0–8.0)

## 2024-06-28 LAB — GLUCOSE, CAPILLARY
Glucose-Capillary: 122 mg/dL — ABNORMAL HIGH (ref 70–99)
Glucose-Capillary: 150 mg/dL — ABNORMAL HIGH (ref 70–99)

## 2024-06-28 LAB — COMPREHENSIVE METABOLIC PANEL WITH GFR
ALT: 26 U/L (ref 0–44)
AST: 55 U/L — ABNORMAL HIGH (ref 15–41)
Albumin: 3.8 g/dL (ref 3.5–5.0)
Alkaline Phosphatase: 62 U/L (ref 38–126)
Anion gap: 14 (ref 5–15)
BUN: 16 mg/dL (ref 8–23)
CO2: 25 mmol/L (ref 22–32)
Calcium: 8.8 mg/dL — ABNORMAL LOW (ref 8.9–10.3)
Chloride: 99 mmol/L (ref 98–111)
Creatinine, Ser: 1.75 mg/dL — ABNORMAL HIGH (ref 0.44–1.00)
GFR, Estimated: 29 mL/min — ABNORMAL LOW (ref >60)
Glucose, Bld: 121 mg/dL — ABNORMAL HIGH (ref 70–99)
Potassium: 3.9 mmol/L (ref 3.5–5.1)
Sodium: 138 mmol/L (ref 135–145)
Total Bilirubin: 1.4 mg/dL — ABNORMAL HIGH (ref 0.0–1.2)
Total Protein: 7.2 g/dL (ref 6.5–8.1)

## 2024-06-28 LAB — PROTIME-INR
INR: 3.4 — ABNORMAL HIGH (ref 0.8–1.2)
Prothrombin Time: 36.1 s — ABNORMAL HIGH (ref 11.4–15.2)

## 2024-06-28 LAB — CK: Total CK: 97 U/L (ref 38–234)

## 2024-06-28 LAB — TROPONIN I (HIGH SENSITIVITY)
Troponin I (High Sensitivity): 16 ng/L (ref <18)
Troponin I (High Sensitivity): 14 ng/L (ref <18)

## 2024-06-28 MED ORDER — ARFORMOTEROL TARTRATE 15 MCG/2ML IN NEBU
15.0000 ug | INHALATION_SOLUTION | Freq: Two times a day (BID) | RESPIRATORY_TRACT | Status: DC
  Administered 2024-06-28 – 2024-07-10 (×23): 15 ug via RESPIRATORY_TRACT
  Filled 2024-06-28 (×23): qty 2

## 2024-06-28 MED ORDER — PANTOPRAZOLE SODIUM 40 MG PO TBEC
40.0000 mg | DELAYED_RELEASE_TABLET | Freq: Every day | ORAL | Status: DC
Start: 2024-06-29 — End: 2024-07-10
  Administered 2024-06-29 – 2024-07-10 (×12): 40 mg via ORAL
  Filled 2024-06-28 (×13): qty 1

## 2024-06-28 MED ORDER — ONDANSETRON HCL 4 MG PO TABS: 4.0000 mg | ORAL_TABLET | Freq: Four times a day (QID) | ORAL | Status: DC | PRN

## 2024-06-28 MED ORDER — TRIMETHOBENZAMIDE HCL 100 MG/ML IM SOLN
200.0000 mg | Freq: Once | INTRAMUSCULAR | Status: DC
Start: 2024-06-28 — End: 2024-07-06
  Filled 2024-06-28: qty 2

## 2024-06-28 MED ORDER — ESCITALOPRAM OXALATE 10 MG PO TABS
10.0000 mg | ORAL_TABLET | Freq: Every day | ORAL | Status: DC
  Administered 2024-06-29 – 2024-07-10 (×12): 10 mg via ORAL
  Filled 2024-06-28 (×12): qty 1

## 2024-06-28 MED ORDER — ATORVASTATIN CALCIUM 40 MG PO TABS
40.0000 mg | ORAL_TABLET | Freq: Every morning | ORAL | Status: DC
Start: 2024-06-29 — End: 2024-07-10
  Administered 2024-06-29 – 2024-07-10 (×12): 40 mg via ORAL
  Filled 2024-06-28 (×12): qty 1

## 2024-06-28 MED ORDER — BUDESONIDE 0.5 MG/2ML IN SUSP
0.5000 mg | Freq: Two times a day (BID) | RESPIRATORY_TRACT | Status: DC
  Administered 2024-06-28 – 2024-07-10 (×23): 0.5 mg via RESPIRATORY_TRACT
  Filled 2024-06-28 (×23): qty 2

## 2024-06-28 MED ORDER — IPRATROPIUM-ALBUTEROL 0.5-2.5 (3) MG/3ML IN SOLN: 3.0000 mL | RESPIRATORY_TRACT | Status: DC | PRN

## 2024-06-28 MED ORDER — FENTANYL CITRATE PF 50 MCG/ML IJ SOSY
25.0000 ug | PREFILLED_SYRINGE | Freq: Once | INTRAMUSCULAR | Status: AC
  Administered 2024-06-28: 25 ug via INTRAVENOUS
  Filled 2024-06-28: qty 1

## 2024-06-28 MED ORDER — DAPAGLIFLOZIN PROPANEDIOL 10 MG PO TABS
10.0000 mg | ORAL_TABLET | Freq: Every morning | ORAL | Status: DC
Start: 2024-06-29 — End: 2024-07-06
  Administered 2024-06-29 – 2024-07-06 (×8): 10 mg via ORAL
  Filled 2024-06-28 (×8): qty 1

## 2024-06-28 MED ORDER — BISOPROLOL FUMARATE 5 MG PO TABS: 5.0000 mg | ORAL_TABLET | Freq: Every morning | ORAL | Status: DC

## 2024-06-28 MED ORDER — TORSEMIDE 20 MG PO TABS
30.0000 mg | ORAL_TABLET | Freq: Every day | ORAL | Status: DC
  Administered 2024-06-29 – 2024-07-10 (×12): 30 mg via ORAL
  Filled 2024-06-28 (×13): qty 1

## 2024-06-28 MED ORDER — ACETAMINOPHEN 325 MG PO TABS
650.0000 mg | ORAL_TABLET | Freq: Four times a day (QID) | ORAL | Status: DC | PRN
  Administered 2024-07-02 – 2024-07-10 (×8): 650 mg via ORAL
  Filled 2024-06-28 (×8): qty 2

## 2024-06-28 MED ORDER — ACETAMINOPHEN 650 MG RE SUPP: 650.0000 mg | Freq: Four times a day (QID) | RECTAL | Status: DC | PRN

## 2024-06-28 MED ORDER — LOSARTAN POTASSIUM 50 MG PO TABS
25.0000 mg | ORAL_TABLET | Freq: Every morning | ORAL | Status: DC
Start: 2024-06-29 — End: 2024-07-03
  Administered 2024-06-29 – 2024-07-03 (×5): 25 mg via ORAL
  Filled 2024-06-28 (×5): qty 1

## 2024-06-28 MED ORDER — HYDROMORPHONE HCL 1 MG/ML IJ SOLN
0.5000 mg | INTRAMUSCULAR | Status: DC | PRN
  Administered 2024-06-28 – 2024-06-30 (×3): 0.5 mg via INTRAVENOUS
  Filled 2024-06-28 (×4): qty 0.5

## 2024-06-28 MED ORDER — ONDANSETRON HCL 4 MG/2ML IJ SOLN
4.0000 mg | Freq: Four times a day (QID) | INTRAMUSCULAR | Status: DC | PRN
  Administered 2024-06-28 – 2024-07-02 (×3): 4 mg via INTRAVENOUS
  Filled 2024-06-28 (×4): qty 2

## 2024-06-28 MED ORDER — AMIODARONE HCL 200 MG PO TABS
200.0000 mg | ORAL_TABLET | Freq: Every day | ORAL | Status: DC
  Administered 2024-06-29 – 2024-07-10 (×12): 200 mg via ORAL
  Filled 2024-06-28 (×12): qty 1

## 2024-06-28 MED ORDER — LEVOTHYROXINE SODIUM 100 MCG PO TABS
100.0000 ug | ORAL_TABLET | Freq: Every day | ORAL | Status: DC
  Administered 2024-06-29 – 2024-06-30 (×2): 100 ug via ORAL
  Filled 2024-06-28 (×3): qty 1

## 2024-06-28 MED ORDER — INSULIN ASPART 100 UNIT/ML IJ SOLN
0.0000 [IU] | INTRAMUSCULAR | Status: DC
  Administered 2024-06-29 – 2024-06-30 (×2): 1 [IU] via SUBCUTANEOUS
  Administered 2024-07-01: 2 [IU] via SUBCUTANEOUS
  Administered 2024-07-01: 1 [IU] via SUBCUTANEOUS
  Administered 2024-07-02: 2 [IU] via SUBCUTANEOUS
  Administered 2024-07-02: 3 [IU] via SUBCUTANEOUS
  Administered 2024-07-02 – 2024-07-03 (×2): 2 [IU] via SUBCUTANEOUS
  Administered 2024-07-03 – 2024-07-04 (×3): 1 [IU] via SUBCUTANEOUS
  Administered 2024-07-05: 2 [IU] via SUBCUTANEOUS
  Administered 2024-07-05: 1 [IU] via SUBCUTANEOUS

## 2024-06-28 MED ORDER — REVEFENACIN 175 MCG/3ML IN SOLN
175.0000 ug | Freq: Every day | RESPIRATORY_TRACT | Status: DC
  Administered 2024-06-29 – 2024-07-10 (×11): 175 ug via RESPIRATORY_TRACT
  Filled 2024-06-28 (×12): qty 3

## 2024-06-28 MED ORDER — OXYCODONE HCL 5 MG PO TABS
5.0000 mg | ORAL_TABLET | Freq: Four times a day (QID) | ORAL | Status: DC | PRN
  Administered 2024-06-29 – 2024-06-30 (×3): 5 mg via ORAL
  Filled 2024-06-28 (×3): qty 1

## 2024-06-28 MED ORDER — ONDANSETRON HCL 4 MG/2ML IJ SOLN
4.0000 mg | Freq: Once | INTRAMUSCULAR | Status: AC
  Administered 2024-06-28: 4 mg via INTRAVENOUS
  Filled 2024-06-28: qty 2

## 2024-06-28 MED ORDER — SODIUM CHLORIDE 0.9 % IV SOLN
3.0000 g | Freq: Two times a day (BID) | INTRAVENOUS | Status: DC
  Administered 2024-06-28 – 2024-07-01 (×6): 3 g via INTRAVENOUS
  Filled 2024-06-28 (×7): qty 8

## 2024-06-28 NOTE — Consult Note (Signed)
 ENT CONSULT:  Reason for Consult: facial trauma and lacerations  Referring Physician: ED team  HPI: [name] is an 78 y.o. female with h/o A-fib on warfarin, multiple cormorbidities who had a fall after syncopal episode who ENT was consulted for facial trauma. Noted level 2 trauma.  History obtained at bedside: Patient reports: reports intraoral bleeding with chin laceration, otherwise reports that vision with mild blurriness left. Some nausea. Cannot open eye well. Did have epistaxis prior, but now resolved. Patient denies - other lacerations, malocclusion, trismus - trouble chewing or swallowing - epistaxis (resolved now), hearing loss after trauma, nasal obstruction - otorrhea, vertigo.   CT was done which was reviewed independently  Past Medical History:  Diagnosis Date   Anemia    Asthma 2010   Atrial fibrillation (HCC)    Atrial flutter (HCC)    Breast carcinoma (HCC) 1995   1995   Cardioembolic stroke (HCC) 01/2012   Right frontal in 01/2012; normal carotid ultrasound; possible LAA thrombus by TEE; virtual complete neurologic recovery   Carotid artery stenosis 1961   Chronic kidney disease, stage 2, mildly decreased GFR    GFR of approximately 60   Diabetes mellitus without complication (HCC) 5+ years ago   Controlled most of time   Diverticulosis of colon (without mention of hemorrhage) 2012   Dr. Golda   Fasting hyperglycemia    120 fasting   Gastroesophageal reflux disease    Gastroparesis    Heart failure with improved ejection fraction (HFimpEF) (HCC) 5+ years   Heart murmur 1961   Hemorrhoids    History of aortic valve replacement with bioprosthetic valve    History of mitral valve replacement with bioprosthetic valve    Hyperlipidemia    Hypertension    pt denies 05/30/13     Dr Alvan chester   Hyponatremia    Hypothyroidism    Rheumatic heart disease    Shortness of breath    Syncope and collapse February, 2024    Past Surgical History:   Procedure Laterality Date   AORTIC VALVE REPLACEMENT N/A 07/18/2013   Procedure: AORTIC VALVE REPLACEMENT (AVR);  Surgeon: Dorise MARLA Fellers, MD;  [facility] OR;  Service: Open Heart Surgery;  Laterality: N/A;   BACK SURGERY     BREAST LUMPECTOMY Right 10/03/1993   CARDIAC CATHETERIZATION  5+ years   CARDIOVERSION N/A 07/26/2013   Procedure: CARDIOVERSION;  Surgeon: Vina LULLA Gull, MD;  Location: [facility] ENDOSCOPY;  Service: Cardiovascular;  Laterality: N/A;   CARDIOVERSION N/A 01/04/2023   Procedure: CARDIOVERSION;  Surgeon: Hobart Powell BRAVO, MD;  Location: [facility] ENDOSCOPY;  Service: Cardiovascular;  Laterality: N/A;   CARDIOVERSION N/A 02/23/2024   Procedure: CARDIOVERSION;  Surgeon: [name], MD;  Location: [facility] ORS;  Service: Endoscopy;  Laterality: N/A;   COLONOSCOPY  10/03/2010   Negative screening procedure   DILATION AND CURETTAGE OF UTERUS     ESOPHAGEAL MANOMETRY N/A 06/17/2013   Procedure: ESOPHAGEAL MANOMETRY (EM);  Surgeon: Alm JONELLE Gander, MD;  Location: [facility] ENDOSCOPY;  Service: Endoscopy;  Laterality: N/A;   INTRAOPERATIVE TRANSESOPHAGEAL ECHOCARDIOGRAM N/A 07/18/2013   Procedure: INTRAOPERATIVE TRANSESOPHAGEAL ECHOCARDIOGRAM;  Surgeon: Dorise MARLA Fellers, MD;  Location: [facility] OR;  Service: Open Heart Surgery;  Laterality: N/A;   LEFT AND RIGHT HEART CATHETERIZATION WITH CORONARY ANGIOGRAM N/A 07/10/2013   Procedure: LEFT AND RIGHT HEART CATHETERIZATION WITH CORONARY ANGIOGRAM;  Surgeon: Lonni JONETTA Cash, MD;  Location: [facility] CATH LAB;  Service: Cardiovascular;  Laterality: N/A;   LUMBAR LAMINECTOMY/DECOMPRESSION  MICRODISCECTOMY Right 05/13/2014   Procedure: LUMBAR LAMINECTOMY/DECOMPRESSION MICRODISCECTOMY 1 LEVEL  lumbar four/five;  Surgeon: Darina MALVA Boehringer, MD;  Location: [facility] NEURO ORS;  Service: Neurosurgery;  Laterality: Right;   MITRAL VALVE REPLACEMENT N/A 07/18/2013   Procedure: MITRAL VALVE (MV) REPLACEMENT;  Surgeon: Dorise MARLA Fellers, MD;  Location: [facility] OR;  Service: Open  Heart Surgery;  Laterality: N/A;   MITRAL VALVE SURGERY  10/03/1960   [facility], closed mitral valvulotomy by finger fracture   TEE WITHOUT CARDIOVERSION  01/24/2012   Procedure: TRANSESOPHAGEAL ECHOCARDIOGRAM (TEE);  Surgeon: Redell GORMAN Shallow, MD;  Location: [facility] ENDOSCOPY;  Service: Cardiovascular;  Laterality: N/A;   TEE WITHOUT CARDIOVERSION N/A 07/11/2013   Procedure: TRANSESOPHAGEAL ECHOCARDIOGRAM (TEE);  Surgeon: Aleene JINNY Passe, MD;  Location: [facility] ENDOSCOPY;  Service: Cardiovascular;  Laterality: N/A;   TEE WITHOUT CARDIOVERSION N/A 07/26/2013   Procedure: TRANSESOPHAGEAL ECHOCARDIOGRAM (TEE);  Surgeon: Vina LULLA Gull, MD;  Location: [facility] ENDOSCOPY;  Service: Cardiovascular;  Laterality: N/A;   TUBAL LIGATION  10/04/1971    Family History  Problem Relation Age of Onset   Heart disease Brother 22       MI   Rheum arthritis Maternal Grandmother    Asthma Maternal Grandfather    Hypothyroidism Mother    Diabetes Sister    Cirrhosis Father     Social History:  reports that she has never smoked. She has never used smokeless tobacco. She reports that she does not drink alcohol and does not use drugs.  Allergies: No Known Allergies  Medications: I have reviewed the patient's current medications.  Results for orders placed or performed during the hospital encounter of 06/28/24 (from the past 48 hours)  Comprehensive metabolic panel     Status: Abnormal   Collection Time: 06/28/24 12:19 PM  Result Value Ref Range   Sodium 138 135 - 145 mmol/L   Potassium 3.9 3.5 - 5.1 mmol/L    Comment: HEMOLYSIS AT THIS LEVEL MAY AFFECT RESULT   Chloride 99 98 - 111 mmol/L   CO2 25 22 - 32 mmol/L   Glucose, Bld 121 (H) 70 - 99 mg/dL    Comment: Glucose reference range applies only to samples taken after fasting for at least 8 hours.   BUN 16 8 - 23 mg/dL   Creatinine, Ser 8.24 (H) 0.44 - 1.00 mg/dL   Calcium  8.8 (L) 8.9 - 10.3 mg/dL   Total Protein 7.2 6.5 - 8.1 g/dL   Albumin  3.8 3.5 - 5.0 g/dL    AST 55 (H) 15 - 41 U/L    Comment: HEMOLYSIS AT THIS LEVEL MAY AFFECT RESULT   ALT 26 0 - 44 U/L    Comment: HEMOLYSIS AT THIS LEVEL MAY AFFECT RESULT   Alkaline Phosphatase 62 38 - 126 U/L   Total Bilirubin 1.4 (H) 0.0 - 1.2 mg/dL    Comment: HEMOLYSIS AT THIS LEVEL MAY AFFECT RESULT   GFR, Estimated 29 (L) >60 mL/min    Comment: (NOTE) Calculated using the CKD-EPI Creatinine Equation (2021)    Anion gap 14 5 - 15    Comment: Performed at [facility] Lab, [address], [place], [place] [address]  CBC     Status: Abnormal   Collection Time: 06/28/24 12:19 PM  Result Value Ref Range   WBC 8.0 4.0 - 10.5 K/uL   RBC 4.60 3.87 - 5.11 MIL/uL   Hemoglobin 12.2 12.0 - 15.0 g/dL   HCT 60.0 63.9 - 53.9 %   MCV 86.7 80.0 -  100.0 fL   MCH 26.5 26.0 - 34.0 pg   MCHC 30.6 30.0 - 36.0 g/dL   RDW 83.6 (H) 88.4 - 84.4 %   Platelets 191 150 - 400 K/uL   nRBC 0.0 0.0 - 0.2 %    Comment: Performed at [facility] Lab, [address] [place], [place] [address]  Ethanol     Status: None   Collection Time: 06/28/24 12:19 PM  Result Value Ref Range   Alcohol, Ethyl (B) <15 <15 mg/dL    Comment: (NOTE) For medical purposes only. Performed at [facility], [address] [place], [place] [address]   Urinalysis, Routine w reflex microscopic -Urine, Clean Catch     Status: Abnormal   Collection Time: 06/28/24 12:19 PM  Result Value Ref Range   Color, Urine STRAW (A) YELLOW   APPearance CLEAR CLEAR   Specific Gravity, Urine 1.002 (L) 1.005 - 1.030   pH 7.0 5.0 - 8.0   Glucose, UA >=500 (A) NEGATIVE mg/dL   Hgb urine dipstick NEGATIVE NEGATIVE   Bilirubin Urine NEGATIVE NEGATIVE   Ketones, ur NEGATIVE NEGATIVE mg/dL   Protein, ur NEGATIVE NEGATIVE mg/dL   Nitrite NEGATIVE NEGATIVE   Leukocytes,Ua NEGATIVE NEGATIVE   RBC / HPF 0-5 0 - 5 RBC/hpf   WBC, UA 0-5 0 - 5 WBC/hpf   Bacteria, UA RARE (A) NONE SEEN   Squamous Epithelial / HPF 0-5 0 - 5 /HPF   Mucus PRESENT    Hyaline  Casts, UA PRESENT     Comment: Performed at [facility] Lab, [address] [place], [place] [address]  Protime-INR     Status: Abnormal   Collection Time: 06/28/24 12:19 PM  Result Value Ref Range   Prothrombin Time 36.1 (H) 11.4 - 15.2 seconds   INR 3.4 (H) 0.8 - 1.2    Comment: (NOTE) INR goal varies based on device and disease states. Performed at [facility] Lab, [address], [place], [place] [address]   Sample to Blood Bank     Status: None   Collection Time: 06/28/24 12:19 PM  Result Value Ref Range   Blood Bank Specimen SAMPLE AVAILABLE FOR TESTING    Sample Expiration      07/01/2024,2359 Performed at [facility] Lab, [address] [place], [place] [address]   Troponin I (High Sensitivity)     Status: None   Collection Time: 06/28/24 12:19 PM  Result Value Ref Range   Troponin I (High Sensitivity) 16 <18 ng/L    Comment: (NOTE) Elevated high sensitivity troponin I (hsTnI) values and significant  changes across serial measurements may suggest ACS but many other  chronic and acute conditions are known to elevate hsTnI results.  Refer to the Links section for chest pain algorithms and additional  guidance. Performed at [facility] Lab, [address], [place], [place] [address]   I-Stat Lactic Acid, ED     Status: None   Collection Time: 06/28/24 12:21 PM  Result Value Ref Range   Lactic Acid, Venous 1.9 0.5 - 1.9 mmol/L  I-Stat Chem 8, ED     Status: Abnormal   Collection Time: 06/28/24 12:22 PM  Result Value Ref Range   Sodium 137 135 - 145 mmol/L   Potassium 3.9 3.5 - 5.1 mmol/L   Chloride 99 98 - 111 mmol/L   BUN 22 8 - 23 mg/dL   Creatinine, Ser 8.09 (H) 0.44 - 1.00 mg/dL   Glucose,  Bld 122 (H) 70 - 99 mg/dL    Comment: Glucose reference range applies only to samples taken after fasting for at least 8 hours.   Calcium , Ion 1.01 (L) 1.15 - 1.40 mmol/L   TCO2 29 22 - 32 mmol/L   Hemoglobin 13.9 12.0 - 15.0 g/dL   HCT 58.9 63.9 - 53.9 %   Troponin I (High Sensitivity)     Status: None   Collection Time: 06/28/24  2:20 PM  Result Value Ref Range   Troponin I (High Sensitivity) 14 <18 ng/L    Comment: (NOTE) Elevated high sensitivity troponin I (hsTnI) values and significant  changes across serial measurements may suggest ACS but many other  chronic and acute conditions are known to elevate hsTnI results.  Refer to the Links section for chest pain algorithms and additional  guidance. Performed at [facility] Lab, [address], [place], [place] [address]     CT HEAD WO CONTRAST Result Date: 06/28/2024 CLINICAL DATA:  Syncopal event with head injury and loss of consciousness. Patient on Coumadin . Facial trauma. Nausea and vomiting. EXAM: CT HEAD WITHOUT CONTRAST CT MAXILLOFACIAL WITHOUT CONTRAST CT CERVICAL SPINE WITHOUT CONTRAST TECHNIQUE: Multidetector CT imaging of the head, cervical spine, and maxillofacial structures were performed using the standard protocol without intravenous contrast. Multiplanar CT image reconstructions of the cervical spine and maxillofacial structures were also generated. RADIATION DOSE REDUCTION: This exam was performed according to the departmental dose-optimization program which includes automated exposure control, adjustment of the mA and/or kV according to patient size and/or use of iterative reconstruction technique. COMPARISON:  01/19/2024 FINDINGS: CT HEAD FINDINGS Brain: Ventricles, cisterns and other CSF spaces are normal. There is chronic ischemic microvascular disease. Stable mild encephalomalacia over the right frontal lobe. 7 mm focus of intraparenchymal acute hemorrhage over the left frontal white matter. Very minimal acute subarachnoid hemorrhage over the adjacent right frontal lobe. No epidural subdural hemorrhage. No significant mass effect or midline shift. Traumatic Brain Injury Risk Stratification Skull Fracture: No - Low/mBIG 1 Subdural Hematoma (SDH): No - Low Subarachnoid  Hemorrhage [facility]): Small single focus of acute subarachnoid hemorrhage over the right frontal region adjacent the midline. Epidural Hematoma (EDH): No - Low/mBIG 1 Cerebral contusion, intra-axial, intraparenchymal Hemorrhage (IPH): Single 7 mm focus of acute intraparenchymal hemorrhage over the left frontal white matter. Intraventricular Hemorrhage (IVH): No - Low/mBIG 1 Midline Shift > 1mm or Edema/effacement of sulci/vents: No - Low/mBIG 1 ---------------------------------------------------- Vascular: No hyperdense vessel or unexpected calcification. Skull: Normal. Negative for fracture or focal lesion. Other: Minimally displaced nasal bone fracture as well as fracture of the junction of the left nasal bone to the frontal process of the maxilla. Opacification over the left maxillary and ethmoid air cells. Minimal opacification over the left sphenoid sinus. Left frontal scalp contusion. CT MAXILLOFACIAL FINDINGS Osseous: Examination demonstrates a depressed comminuted left orbital floor fracture with partial entrapment of inferior rectus. There is also fracture of the medial wall the left orbit without entrapment of intraorbital contents. There is a minimally displaced nasal bone fracture. Subtle fracture of the junction of the left nasal bone to the frontal process of the left axilla. Orbits: Fractures of the medial wall and floor of the left orbit as described above. Left periorbital soft tissue swelling. Globes are normal and symmetric. Retrobulbar spaces are otherwise unremarkable. Sinuses: Hypoplastic frontal sinuses. Hemorrhagic debris over the and left ethmoid and maxillary sinuses. Mastoid air cells are clear. Soft tissues: Soft tissue swelling adjacent the left mandible as well  as over the left nasal bridge and left periorbital region. CT CERVICAL SPINE FINDINGS Alignment: Normal. Skull base and vertebrae: Vertebral body heights are maintained. There is mild spondylosis throughout the cervical spine to  include uncovertebral joint spurring and facet arthropathy. No acute fracture. Mild bilateral neural foraminal narrowing at the C5-6 level and C6-7 levels. Soft tissues and spinal canal: No prevertebral fluid or swelling. No visible canal hematoma. Disc levels: Mild disc space narrowing at the C5-6 and C6-7 levels. Central disc protrusion at the C3-4 level with more pronounced central disc protrusion at the C4-5 level. Upper chest: No acute findings. Other: Schmorl's node over the superior endplate of T2 unchanged. IMPRESSION: 1. 7 mm focus of acute intraparenchymal hemorrhage over the left frontal white matter. Very minimal acute subarachnoid hemorrhage over the adjacent right frontal lobe. Findings compatible with minor TBI BIG-2 categorization. 2. Chronic ischemic microvascular disease with stable mild encephalomalacia over the right frontal lobe. 3. Depressed comminuted left orbital floor fracture with partial entrapment of inferior rectus muscle. Fracture of the medial wall the left orbit without entrapment of intraorbital contents. 4. Minimally displaced nasal bone fracture as well as fracture of the junction of the left nasal bone to the frontal process of the left maxilla. 5. No acute cervical spine injury. 6. Mild spondylosis throughout the cervical spine with mild disc disease at the C5-6 and C6-7 levels. Central disc protrusion at the C3-4 level with more pronounced central disc protrusion at the C4-5 level as these findings would be better evaluated with MRI. Critical Value/emergent results were called by telephone at the time of interpretation on 06/28/2024 at 1:28 pm to provider ANDREW TEE , who verbally acknowledged these results. Electronically Signed   By: Toribio Agreste M.D.   On: 06/28/2024 13:29   CT MAXILLOFACIAL WO CONTRAST Result Date: 06/28/2024 CLINICAL DATA:  Syncopal event with head injury and loss of consciousness. Patient on Coumadin . Facial trauma. Nausea and vomiting. EXAM: CT HEAD  WITHOUT CONTRAST CT MAXILLOFACIAL WITHOUT CONTRAST CT CERVICAL SPINE WITHOUT CONTRAST TECHNIQUE: Multidetector CT imaging of the head, cervical spine, and maxillofacial structures were performed using the standard protocol without intravenous contrast. Multiplanar CT image reconstructions of the cervical spine and maxillofacial structures were also generated. RADIATION DOSE REDUCTION: This exam was performed according to the departmental dose-optimization program which includes automated exposure control, adjustment of the mA and/or kV according to patient size and/or use of iterative reconstruction technique. COMPARISON:  01/19/2024 FINDINGS: CT HEAD FINDINGS Brain: Ventricles, cisterns and other CSF spaces are normal. There is chronic ischemic microvascular disease. Stable mild encephalomalacia over the right frontal lobe. 7 mm focus of intraparenchymal acute hemorrhage over the left frontal white matter. Very minimal acute subarachnoid hemorrhage over the adjacent right frontal lobe. No epidural subdural hemorrhage. No significant mass effect or midline shift. Traumatic Brain Injury Risk Stratification Skull Fracture: No - Low/mBIG 1 Subdural Hematoma (SDH): No - Low Subarachnoid Hemorrhage [facility]): Small single focus of acute subarachnoid hemorrhage over the right frontal region adjacent the midline. Epidural Hematoma (EDH): No - Low/mBIG 1 Cerebral contusion, intra-axial, intraparenchymal Hemorrhage (IPH): Single 7 mm focus of acute intraparenchymal hemorrhage over the left frontal white matter. Intraventricular Hemorrhage (IVH): No - Low/mBIG 1 Midline Shift > 1mm or Edema/effacement of sulci/vents: No - Low/mBIG 1 ---------------------------------------------------- Vascular: No hyperdense vessel or unexpected calcification. Skull: Normal. Negative for fracture or focal lesion. Other: Minimally displaced nasal bone fracture as well as fracture of the junction of the left nasal bone to the  frontal process of the  maxilla. Opacification over the left maxillary and ethmoid air cells. Minimal opacification over the left sphenoid sinus. Left frontal scalp contusion. CT MAXILLOFACIAL FINDINGS Osseous: Examination demonstrates a depressed comminuted left orbital floor fracture with partial entrapment of inferior rectus. There is also fracture of the medial wall the left orbit without entrapment of intraorbital contents. There is a minimally displaced nasal bone fracture. Subtle fracture of the junction of the left nasal bone to the frontal process of the left axilla. Orbits: Fractures of the medial wall and floor of the left orbit as described above. Left periorbital soft tissue swelling. Globes are normal and symmetric. Retrobulbar spaces are otherwise unremarkable. Sinuses: Hypoplastic frontal sinuses. Hemorrhagic debris over the and left ethmoid and maxillary sinuses. Mastoid air cells are clear. Soft tissues: Soft tissue swelling adjacent the left mandible as well as over the left nasal bridge and left periorbital region. CT CERVICAL SPINE FINDINGS Alignment: Normal. Skull base and vertebrae: Vertebral body heights are maintained. There is mild spondylosis throughout the cervical spine to include uncovertebral joint spurring and facet arthropathy. No acute fracture. Mild bilateral neural foraminal narrowing at the C5-6 level and C6-7 levels. Soft tissues and spinal canal: No prevertebral fluid or swelling. No visible canal hematoma. Disc levels: Mild disc space narrowing at the C5-6 and C6-7 levels. Central disc protrusion at the C3-4 level with more pronounced central disc protrusion at the C4-5 level. Upper chest: No acute findings. Other: Schmorl's node over the superior endplate of T2 unchanged. IMPRESSION: 1. 7 mm focus of acute intraparenchymal hemorrhage over the left frontal white matter. Very minimal acute subarachnoid hemorrhage over the adjacent right frontal lobe. Findings compatible with minor TBI BIG-2  categorization. 2. Chronic ischemic microvascular disease with stable mild encephalomalacia over the right frontal lobe. 3. Depressed comminuted left orbital floor fracture with partial entrapment of inferior rectus muscle. Fracture of the medial wall the left orbit without entrapment of intraorbital contents. 4. Minimally displaced nasal bone fracture as well as fracture of the junction of the left nasal bone to the frontal process of the left maxilla. 5. No acute cervical spine injury. 6. Mild spondylosis throughout the cervical spine with mild disc disease at the C5-6 and C6-7 levels. Central disc protrusion at the C3-4 level with more pronounced central disc protrusion at the C4-5 level as these findings would be better evaluated with MRI. Critical Value/emergent results were called by telephone at the time of interpretation on 06/28/2024 at 1:28 pm to provider ANDREW TEE , who verbally acknowledged these results. Electronically Signed   By: Toribio Agreste M.D.   On: 06/28/2024 13:29   CT CERVICAL SPINE WO CONTRAST Result Date: 06/28/2024 CLINICAL DATA:  Syncopal event with head injury and loss of consciousness. Patient on Coumadin . Facial trauma. Nausea and vomiting. EXAM: CT HEAD WITHOUT CONTRAST CT MAXILLOFACIAL WITHOUT CONTRAST CT CERVICAL SPINE WITHOUT CONTRAST TECHNIQUE: Multidetector CT imaging of the head, cervical spine, and maxillofacial structures were performed using the standard protocol without intravenous contrast. Multiplanar CT image reconstructions of the cervical spine and maxillofacial structures were also generated. RADIATION DOSE REDUCTION: This exam was performed according to the departmental dose-optimization program which includes automated exposure control, adjustment of the mA and/or kV according to patient size and/or use of iterative reconstruction technique. COMPARISON:  01/19/2024 FINDINGS: CT HEAD FINDINGS Brain: Ventricles, cisterns and other CSF spaces are normal. There is  chronic ischemic microvascular disease. Stable mild encephalomalacia over the right frontal lobe. 7 mm focus of  intraparenchymal acute hemorrhage over the left frontal white matter. Very minimal acute subarachnoid hemorrhage over the adjacent right frontal lobe. No epidural subdural hemorrhage. No significant mass effect or midline shift. Traumatic Brain Injury Risk Stratification Skull Fracture: No - Low/mBIG 1 Subdural Hematoma (SDH): No - Low Subarachnoid Hemorrhage [facility]): Small single focus of acute subarachnoid hemorrhage over the right frontal region adjacent the midline. Epidural Hematoma (EDH): No - Low/mBIG 1 Cerebral contusion, intra-axial, intraparenchymal Hemorrhage (IPH): Single 7 mm focus of acute intraparenchymal hemorrhage over the left frontal white matter. Intraventricular Hemorrhage (IVH): No - Low/mBIG 1 Midline Shift > 1mm or Edema/effacement of sulci/vents: No - Low/mBIG 1 ---------------------------------------------------- Vascular: No hyperdense vessel or unexpected calcification. Skull: Normal. Negative for fracture or focal lesion. Other: Minimally displaced nasal bone fracture as well as fracture of the junction of the left nasal bone to the frontal process of the maxilla. Opacification over the left maxillary and ethmoid air cells. Minimal opacification over the left sphenoid sinus. Left frontal scalp contusion. CT MAXILLOFACIAL FINDINGS Osseous: Examination demonstrates a depressed comminuted left orbital floor fracture with partial entrapment of inferior rectus. There is also fracture of the medial wall the left orbit without entrapment of intraorbital contents. There is a minimally displaced nasal bone fracture. Subtle fracture of the junction of the left nasal bone to the frontal process of the left axilla. Orbits: Fractures of the medial wall and floor of the left orbit as described above. Left periorbital soft tissue swelling. Globes are normal and symmetric. Retrobulbar spaces are  otherwise unremarkable. Sinuses: Hypoplastic frontal sinuses. Hemorrhagic debris over the and left ethmoid and maxillary sinuses. Mastoid air cells are clear. Soft tissues: Soft tissue swelling adjacent the left mandible as well as over the left nasal bridge and left periorbital region. CT CERVICAL SPINE FINDINGS Alignment: Normal. Skull base and vertebrae: Vertebral body heights are maintained. There is mild spondylosis throughout the cervical spine to include uncovertebral joint spurring and facet arthropathy. No acute fracture. Mild bilateral neural foraminal narrowing at the C5-6 level and C6-7 levels. Soft tissues and spinal canal: No prevertebral fluid or swelling. No visible canal hematoma. Disc levels: Mild disc space narrowing at the C5-6 and C6-7 levels. Central disc protrusion at the C3-4 level with more pronounced central disc protrusion at the C4-5 level. Upper chest: No acute findings. Other: Schmorl's node over the superior endplate of T2 unchanged. IMPRESSION: 1. 7 mm focus of acute intraparenchymal hemorrhage over the left frontal white matter. Very minimal acute subarachnoid hemorrhage over the adjacent right frontal lobe. Findings compatible with minor TBI BIG-2 categorization. 2. Chronic ischemic microvascular disease with stable mild encephalomalacia over the right frontal lobe. 3. Depressed comminuted left orbital floor fracture with partial entrapment of inferior rectus muscle. Fracture of the medial wall the left orbit without entrapment of intraorbital contents. 4. Minimally displaced nasal bone fracture as well as fracture of the junction of the left nasal bone to the frontal process of the left maxilla. 5. No acute cervical spine injury. 6. Mild spondylosis throughout the cervical spine with mild disc disease at the C5-6 and C6-7 levels. Central disc protrusion at the C3-4 level with more pronounced central disc protrusion at the C4-5 level as these findings would be better evaluated with  MRI. Critical Value/emergent results were called by telephone at the time of interpretation on 06/28/2024 at 1:28 pm to provider ANDREW TEE , who verbally acknowledged these results. Electronically Signed   By: Toribio Agreste M.D.   On: 06/28/2024 13:29  DG Chest Port 1 View Result Date: 06/28/2024 EXAM: 1 VIEW(S) XRAY OF THE CHEST 06/28/2024 12:31:00 PM COMPARISON: 05/30/2024 CLINICAL HISTORY: trauma; fall on thinners FINDINGS: LUNGS AND PLEURA: Diffuse interstitial prominence. Bibasilar patchy opacities. No pulmonary edema. No pleural effusion. No pneumothorax. HEART AND MEDIASTINUM: Similar enlarged cardiomediastinal silhouette status post aortic and mitral valve prostheses. BONES AND SOFT TISSUES: Right axillary surgical clips. Nondisplaced median sternotomy wires. No acute osseous abnormality. IMPRESSION: 1. Diffuse interstitial prominence with bibasilar patchy opacities, which may reflect edema or infection; correlate clinically. 2. Stable enlarged cardiomediastinal silhouette with aortic and mitral valve prostheses. Electronically signed by: Waddell Calk MD 06/28/2024 01:06 PM EDT RP Workstation: [id number]    ROS: as noted above  Blood pressure (!) 151/61, pulse 62, temperature 98.5 F (36.9 C), temperature source Oral, resp. rate (!) 21, height 5' 3 (1.6 m), weight 68 kg, SpO2 96%.  PHYSICAL EXAM: CONSTITUTIONAL: well developed CARDIOVASCULAR: normal rate during examination PULMONARY/CHEST WALL: effort normal and no stridor, no stertor, no dysphonia HENT: Head : normocephalic; noted left periorbital ecchymosis and swelling with likely small hematoma upper eyelid and small left forehead; EOM appear intact; noted diffuse chemosis and does have some limitation in all gazes except medial. Otherwise no palpable stepoffs midface, or mandible. No noted palatal instability. No noted stepoffs over zygoma. Ears: Right ear:   canal normal, external ear normal and hearing normal Left ear:   canal  normal, external ear normal and hearing normal Nose: nose normal; no epistaxis; no noted septal hematoma Mouth/Throat:  Mouth: uvula midline; noted full thickness laceration left lower lip subunit with additional intraoral mucosal lacerations lower lip (see procedure note) - repaired Throat: oropharynx clear NECK: supple, trachea normal and no thyromegaly or cervical LAD; C-collar in place  Studies Reviewed: CT Face independently interpreted with respect to facial bones: small left nasal bone fracture which is minimally dispalced; left frontal scalp contusion; noted left blowout fracture, medial bone fragment abuts inferior rectus; noted modest displacement medial orbital wall fracture.   Assessment/Plan: 78 y.o. with:  Left nasal bone fracture Facial lacerations - intraoral and chin Left orbital floor and medial orbital wall fracture  - Lacerations repaired in the ED --- recommend good oral care with peridex  mouthwash BID x7d. Given full thickness and intraoral nature, recommend augmentin BID x7d; bacitracin  ointment BID x7d to chin - Orbital floor - given limited gaze in multiple directions, although does have chemosis, would recommend ophthalmology consult for formal EOM and pressure evaluation. If entrapped as result of fracture, please page ENT - No nose blowing x2 weeks; can do afrin PRN for bleeding from nose if occurs - Soft diet x5d; then can advance to regular diet  ENT will request f/u in 2-3 weeks  Eldora KATHEE Blanch   MDM: mod Complexity/Problems addressed: mod Data complexity: mod - independent CT review - Morbidity: mod - drug recommendation     06/28/2024, 5:56 PM

## 2024-06-28 NOTE — Progress Notes (Signed)
 Orthopedic Tech Progress Note Patient Details:  [name] [birth year] [id number]  Patient ID: [name], female   DOB: 10/06/1945, 78 y.o.   MRN: [id number] [place] [facility] trauma. Ortho Not needed. [name] 06/28/2024, 12:30 PM

## 2024-06-28 NOTE — Procedures (Addendum)
 ENT procedure note: Pre-procedure diagnosis: Intraoral and chin lacerations Post-procedure diagnosis: same  Procedure: Moderate/intermediate complexity closure of multiple intraoral lip and chin laceration (total 8 cm) - CPT 12054  Surgeon: Eldora Blanch Anesth: Local -- injection of 1% Lidocaine  with 1:100000 epinephrine  into wound edges Complication: None apparent  Findings: Multiple intraoral lacerations over midline lower lip with mucosal and muscle violation but not full thickness - 6 cm; 2 cm full thickness lower lip on left subunit laceration; all repaired   Description:  After discussing risks, benefits, and alternatives, verbal consent was obtained. All wounds were first irrigated with normal saline and wound edges injected in total with 5cc of  1% Lidocaine  with 1:100000 epinephrine . The wounds were then prepped with betadine and patient prepped in usual fashion for procedure. The intraoral non-full thickness lip lacerations were repaired using interrupted 3-0 vicryl to reapproximate muscle and then 4-0 vicryl horizontal mattress for the mucosa. Next, the full thickness laceration was addressed - interrupted 3 -0 vicryl was first used to reapproximate the muscle, then 4-0 vicryl horizontal mattress for the intraoral mucosa. The skin was re-approximated with 5-0 fast gut. The wounds were cleansed and all instruments removed. Bacitracin  ointment was added to the wounds.    Teyon Odette B Ferris Fielden

## 2024-06-28 NOTE — Progress Notes (Signed)
 Patient ID: [name], female   DOB: [birth year], 78 y.o.   MRN: [id number] Called to discuss CT scan and is 78 year old with a syncopal episode.  CT shows small amount of intraparenchymal hemorrhage with subarachnoid minimal insignificant and no mass effect.  Patient does have a history of A-fib heart valve and CVA was on Coumadin  is at high risk for reversal and also being off anticoagulation however I do think we need to stop the anticoagulation and I guess we will defer not to reverse her due to her other risk factors.  I do think the syncope needs to be worked up whether that is a primary cerebrovascular accident event or primary cardiogenic.  Recommend neurology/medicine evaluation to rule out CVA versus cardiogenic syncope.  No neurosurgical intervention needed at this time

## 2024-06-28 NOTE — H&P (Signed)
 History and Physical    Patient: [name] FMW:[id number] DOB: [birth year] DOA: 06/28/2024 DOS: the patient was seen and examined on 06/28/2024 PCP: Sheryle Carwin, MD  Patient coming from: Home.  Lives with husband.  Uses cane for ambulation.  Chief Complaint:  Chief Complaint  Patient presents with   Loss of Consciousness   HPI: [name] is a 78 y.o. female with PMH of HFpEF, A-fib/flutter on warfarin, rheumatic heart disease, AV and MV replacement, COPD, CVA, carotid artery stenosis, CKD-3B, NIDDM-2, hypothyroidism, lumbar laminectomy and decompression and mild cognitive impairment brought to ED by EMS after syncopal episode.  History provided by patient and patient's husband at bedside.  Husband states that she unusually felt well this morning.  Going to [place] about 11:15 AM when she started going in the wrong direction.  She was somewhat confused.  Husband tried to redirect her.  However, she moved forward quickly and fell forward with face/head against the concrete floor.  She was not responding for about 30 seconds.  She came around a little groggy.  No seizure-like activity.  No report of chest pain.  She has chronic shortness of breath and cough unchanged from baseline.  No recent fever, runny nose, sore throat or UTI symptoms.  Patient denies headache but pain around her left eye.  Took her morning meds as usual.  No new medications.  Per triage note, patient was nauseous and vomiting.  Although some report of left-sided facial droop per EMS.  Patient uses cane for ambulation.  Denies smoking cigarette, drinking alcohol or recreational drug use.  Per husband, she can have CPR and intubation but not interested in prolonging life artificially.  In ED, BP slightly elevated.  Otherwise stable vitals. Cr 1.75 (baseline).  AST 55.  CBC without significant finding.  Lactic acid 1.9.  Troponin 16 and 14.  EKG sinus rhythm with first-degree AVB, LPFB and abnormal RWP.  INR 3.4.  UA  without significant finding.  CT head, cervical and maxillofacial showed 7 mm focus of acute IPH over the left frontal white matter, minimal acute SAH over the adjacent right frontal lobe, depressed comminuted left orbital floor fracture with partial entrapment of inferior rectus muscle, fracture of the medial wall of the left orbit without entrapment of intraorbital contents, minimally displaced nasal bone fracture as well as fracture of the junction of the left nasal bone to the frontal process of the left maxilla.  Neurosurgery consulted and recommended holding warfarin and syncope and CVA workup.  ENT, Dr. Tobie consulted.  MRI brain and cervical spine ordered.  Admission requested.    Review of Systems: As mentioned in the history of present illness. All other systems reviewed and are negative. Past Medical History:  Diagnosis Date   Anemia    Arrhythmia 2020?   Asthma 2010   Atrial fibrillation (HCC)    Breast carcinoma (HCC) 1995   1995   Cardioembolic stroke (HCC) 01/2012   Right frontal in 01/2012; normal carotid ultrasound; possible LAA thrombus by TEE; virtual complete neurologic recovery   Carotid artery occlusion 1961   CHF (congestive heart failure) (HCC) 5+ years   Chronic kidney disease, stage 2, mildly decreased GFR    GFR of approximately 60   Diabetes mellitus without complication (HCC) 5+ years ago   Controlled most of time   Diverticulosis of colon (without mention of hemorrhage) 2012   Dr. Golda   Dysrhythmia    Fasting hyperglycemia    120 fasting  Gastroesophageal reflux disease    Gastroparesis    Heart murmur 1961   Hemorrhoids    Hyperlipidemia    Hypertension    pt denies 05/30/13     Dr Alvan chester   Hyponatremia    Hypothyroidism    Rheumatic heart disease    a. 07/2013 Echo: Ef 55-60%, Mod AS/AI, Mod-Sev MS, sev dil LA, PASP 58;  b. 07/2013 TEE: EF 35-40%, mild-mod AS/AI, mod-sev MS, LA smoke;  c. 07/2013 Cath: elev R heart pressures, nl cors.    Shortness of breath    Syncope and collapse February, 2024   Past Surgical History:  Procedure Laterality Date   AORTIC VALVE REPLACEMENT N/A 07/18/2013   Procedure: AORTIC VALVE REPLACEMENT (AVR);  Surgeon: Dorise MARLA Fellers, MD;  Location: [facility] OR;  Service: Open Heart Surgery;  Laterality: N/A;   BACK SURGERY     BREAST LUMPECTOMY Right 10/03/1993   CARDIAC CATHETERIZATION  5+ years   CARDIOVERSION N/A 07/26/2013   Procedure: CARDIOVERSION;  Surgeon: Vina LULLA Gull, MD;  Location: [facility] ENDOSCOPY;  Service: Cardiovascular;  Laterality: N/A;   CARDIOVERSION N/A 01/04/2023   Procedure: CARDIOVERSION;  Surgeon: Hobart Powell BRAVO, MD;  Location: [facility] ENDOSCOPY;  Service: Cardiovascular;  Laterality: N/A;   CARDIOVERSION N/A 02/23/2024   Procedure: CARDIOVERSION;  Surgeon: [name], MD;  Location: [facility] ORS;  Service: Endoscopy;  Laterality: N/A;   COLONOSCOPY  10/03/2010   Negative screening procedure   DILATION AND CURETTAGE OF UTERUS     ESOPHAGEAL MANOMETRY N/A 06/17/2013   Procedure: ESOPHAGEAL MANOMETRY (EM);  Surgeon: Alm JONELLE Gander, MD;  Location: [facility] ENDOSCOPY;  Service: Endoscopy;  Laterality: N/A;   INTRAOPERATIVE TRANSESOPHAGEAL ECHOCARDIOGRAM N/A 07/18/2013   Procedure: INTRAOPERATIVE TRANSESOPHAGEAL ECHOCARDIOGRAM;  Surgeon: Dorise MARLA Fellers, MD;  Location: [facility] OR;  Service: Open Heart Surgery;  Laterality: N/A;   LEFT AND RIGHT HEART CATHETERIZATION WITH CORONARY ANGIOGRAM N/A 07/10/2013   Procedure: LEFT AND RIGHT HEART CATHETERIZATION WITH CORONARY ANGIOGRAM;  Surgeon: Lonni JONETTA Cash, MD;  Location: [facility] CATH LAB;  Service: Cardiovascular;  Laterality: N/A;   LUMBAR LAMINECTOMY/DECOMPRESSION MICRODISCECTOMY Right 05/13/2014   Procedure: LUMBAR LAMINECTOMY/DECOMPRESSION MICRODISCECTOMY 1 LEVEL  lumbar four/five;  Surgeon: Darina MALVA Boehringer, MD;  Location: [facility] NEURO ORS;  Service: Neurosurgery;  Laterality: Right;   MITRAL VALVE REPLACEMENT N/A 07/18/2013   Procedure:  MITRAL VALVE (MV) REPLACEMENT;  Surgeon: Dorise MARLA Fellers, MD;  Location: [facility] OR;  Service: Open Heart Surgery;  Laterality: N/A;   MITRAL VALVE SURGERY  10/03/1960   [facility], closed mitral valvulotomy by finger fracture   TEE WITHOUT CARDIOVERSION  01/24/2012   Procedure: TRANSESOPHAGEAL ECHOCARDIOGRAM (TEE);  Surgeon: Redell GORMAN Shallow, MD;  Location: [facility] ENDOSCOPY;  Service: Cardiovascular;  Laterality: N/A;   TEE WITHOUT CARDIOVERSION N/A 07/11/2013   Procedure: TRANSESOPHAGEAL ECHOCARDIOGRAM (TEE);  Surgeon: Aleene JINNY Passe, MD;  Location: [facility] ENDOSCOPY;  Service: Cardiovascular;  Laterality: N/A;   TEE WITHOUT CARDIOVERSION N/A 07/26/2013   Procedure: TRANSESOPHAGEAL ECHOCARDIOGRAM (TEE);  Surgeon: Vina LULLA Gull, MD;  Location: [facility] ENDOSCOPY;  Service: Cardiovascular;  Laterality: N/A;   TUBAL LIGATION  10/04/1971   Social History:  reports that she has never smoked. She has never used smokeless tobacco. She reports that she does not drink alcohol and does not use drugs.  No Known Allergies  Family History  Problem Relation Age of Onset   Heart disease Brother 1       MI   Rheum arthritis Maternal Grandmother    Asthma  Maternal Grandfather    Hypothyroidism Mother    Diabetes Sister    Cirrhosis Father     Prior to Admission medications   Medication Sig Start Date End Date Taking? Authorizing Provider  acetaminophen  (TYLENOL ) 325 MG tablet Take 2 tablets (650 mg total) by mouth every 6 (six) hours as needed. 01/20/24   [name], DO  albuterol  (VENTOLIN  HFA) 108 (90 Base) MCG/ACT inhaler Inhale 2 puffs into the lungs every 6 (six) hours as needed for wheezing or shortness of breath. 08/17/23   [provider]  amiodarone  (PACERONE ) 200 MG tablet Take 1 tablet (200 mg total) by mouth daily. 02/02/24   Mealor, [name], MD  atorvastatin  (LIPITOR) 40 MG tablet TAKE ONE TABLET BY MOUTH ONCE DAILY. 07/24/23   [name], MD  azelastine  (ASTELIN ) 0.1 % nasal spray Place  1 spray into both nostrils 2 (two) times daily. Use in each nostril as directed 04/30/24   [name], NP  bisoprolol  (ZEBETA ) 5 MG tablet Take 1 tablet (5 mg total) by mouth daily. 02/23/24   Furth, Cadence H, PA-C  budesonide -formoterol  (SYMBICORT ) 80-4.5 MCG/ACT inhaler One puff first thing in am and 12 hours later 05/13/24   Wert, Michael B, MD  escitalopram  (LEXAPRO ) 10 MG tablet Take 10 mg by mouth daily with breakfast. 04/10/15   [provider]  famotidine  (PEPCID ) 20 MG tablet TAKE ONE TABLET BY MOUTH EVERY DAY AFTER SUPPER 12/25/23   Wert, Michael B, MD  FARXIGA  10 MG TABS tablet Take 10 mg by mouth daily. 03/28/23   [provider]  fexofenadine (ALLEGRA) 180 MG tablet Take 180 mg by mouth in the morning.    [provider]  fluticasone  (FLONASE ) 50 MCG/ACT nasal spray Place 1 spray into both nostrils daily. 09/05/13   [name], MD  levothyroxine  (SYNTHROID ) 100 MCG tablet Take 100 mcg by mouth daily before breakfast.    [provider]  losartan  (COZAAR ) 25 MG tablet Take 25 mg by mouth daily. 01/22/24   [provider]  metFORMIN  (GLUCOPHAGE ) 500 MG tablet Take 1 tablet (500 mg total) by mouth 2 (two) times daily with a meal. 08/03/13   Collins, Tillman CROME, PA-C  Multiple Vitamin (MULTIVITAMIN WITH MINERALS) TABS tablet Take 1 tablet by mouth daily.    [provider]  pantoprazole  (PROTONIX ) 40 MG tablet TAKE 1 TABLET 30 TO 60 MINUTES BEFORE FIRST MEAL OF THE DAY. 03/15/24   Wert, Michael B, MD  Polyethyl Glycol-Propyl Glycol (SYSTANE OP) Place 1 drop into both eyes daily as needed (dry eyes).    [provider]  potassium chloride  SA (KLOR-CON  M) 20 MEQ tablet Take 1 tablet (20 mEq total) by mouth daily. 11/14/22 06/10/24  Rai, Nydia POUR, MD  torsemide  (DEMADEX ) 20 MG tablet Take 20mg  (1 tab) by mouth every other day alternating with 40mg  (2 tabs) every other day 04/12/24   [name], MD  TRELEGY ELLIPTA   100-62.5-25 MCG/ACT AEPB Inhale 1 puff into the lungs daily. 06/25/24   [provider]  warfarin (COUMADIN ) 2.5 MG tablet TAKE AS DIRECTED BY COUMADIN  CLINIC. Patient taking differently: Take 2.5 mg by mouth See admin instructions. TAKE AS DIRECTED BY COUMADIN  CLINIC.Take 1 tablet every day except 1/2 tablet on Thursday and Sunday 05/13/24   [name], MD    Physical Exam: Vitals:   06/28/24 1223 06/28/24 1245 06/28/24 1630 06/28/24 1631  BP:  (!) 152/58 (!) 141/55   Pulse:  60  60   Resp:  14 13   Temp:    98.1 F (36.7 C)  TempSrc:    Axillary  SpO2:  100% 92%   Weight: 68 kg     Height: 5' 3 (1.6 m)      GENERAL: No apparent distress.  Nontoxic. HEENT: MMM.  Vision and hearing grossly intact.  Left eyelid swelling/ecchymosis.  Difficult to assess EOM in left eye. NECK: Supple.  No apparent JVD.  RESP:  No IWOB.  Fair aeration bilaterally. CVS:  RRR.  3/6 SEM, louder over apex.   ABD/GI/GU: BS+. Abd soft, NTND.  MSK/EXT:   No apparent deformity. Moves extremities. No edema.  SKIN: Small skin bruise on left hand. NEURO: Awake and alert. Oriented to self, person, place and month.  Clear speech.  No facial droop.  PERRL.  No pronator drift.  Motor symmetric and intact in all extremities.  Sensory intact.   PSYCH: Calm. Normal affect.  Data Reviewed: See HPI  Assessment and Plan: Syncope and collapse: No prodromes.  Patient with significant cardiac history.  Also report of left facial droop when EMS arrived.  CT head as above.  Serial troponin negative.  EKG sinus rhythm with first-degree AVB, left posterior fascicular block and abnormal RWP.  Basic labs without significant finding. - Admit to progressive unit - Check echocardiogram and carotid ultrasound - Follow MRI brain and cervical spine -Consult cardiology - Fall precaution, orthostatic vitals, PT/OT - Hold bisoprolol  given first-degree AVB.  Acute left intraparenchymal hemorrhage/SAH: CT showed 7 mm focus  of acute IPH over the left frontal white matter, minimal acute SAH over the adjacent right frontal lobe.  Neuroexam reassuring. - Neurosurgery recommended holding warfarin - Neurovascular check every 2 hours - Follow MRI brain and cervical spine - Low threshold for repeat CT if change in mental status or focal neurodeficit.  Orbital floor fracture/nasal bone fracture: CT also showed depressed comminuted left orbital floor fracture with partial entrapment of inferior rectus muscle, fracture of the medial wall of the left orbit without entrapment of intraorbital contents, minimally displaced nasal bone fracture as well as fracture of the junction of the left nasal bone to the frontal process of the left maxilla.  Seems to move left eye but difficult exam due to upper eyelid swelling. - ENT, Dr. Tobie consulted by EDP. - IV Unasyn   Chronic HFpEF: TTE in 04/2020 with LVEF of 60 to 65%, determinate DD, bioprosthetic MVR and AVR.  She has chronic BLE edema, left > right.  Denies chest pain.  Has chronic shortness of breath unchanged from baseline. - Continue home torsemide , Farxiga  and losartan . - Strict intake and output, daily weight, renal functions and electrolyte - Update echocardiogram  Paroxysmal A-fib/flutter/first-degree AVB: On amiodarone , warfarin and bisoprolol .  Currently in sinus rhythm with first-degree AVB.  INR 3.4. - Continue home amiodarone  - Holding warfarin in the setting of IPH - Holding bisoprolol  due to first-degree AV block  - Consult cardiology  Essential hypertension: SBP slightly elevated.  Currently due to pain. -Continue home Farxiga , losartan  and torsemide  -Pain control  CKD-3B: Baseline Cr ~1.7. Recent Labs    12/31/23 0958 01/19/24 1950 01/25/24 2031 02/14/24 0920 02/19/24 1436 03/14/24 1027 04/25/24 0839 05/30/24 0727 06/28/24 1219 06/28/24 1222  BUN 15 15 17 16 18 20 21 17 16 22   CREATININE 1.33* 1.59* 1.66* 1.54* 1.76* 1.46* 1.72* 1.68* 1.75* 1.90*   - Continue monitor  Hypothyroidism - Continue home Synthroid   Elevated AST - Check CK  GERD - Continue PPI  Mood disorder - Continue Lexapro .     Advance Care Planning:   Code Status: Full Code -discussed with patient and patient's husband at bedside  Consults: Neurosurgery, ENT and cardiology  Family Communication: Updated patient's husband at bedside  Severity of Illness: The appropriate patient status for this patient is OBSERVATION. Observation status is judged to be reasonable and necessary in order to provide the required intensity of service to ensure the patient's safety. The patient's presenting symptoms, physical exam findings, and initial radiographic and laboratory data in the context of their medical condition is felt to place them at decreased risk for further clinical deterioration. Furthermore, it is anticipated that the patient will be medically stable for discharge from the hospital within 2 midnights of admission.   Author: Mignon ONEIDA Bump, MD 06/28/2024 4:56 PM  For on call review [email or web address].

## 2024-06-28 NOTE — Progress Notes (Signed)
 Patient arrived to floor with trauma nurse and ED nurse tech. Patient's belongings at bedside. Patient is alert and oriented to person, place, and situation; disoriented to time (per husband, this is her baseline since her stroke 10 years ago). Patient has C-collar on and is settled in room with call light close by.   06/28/24 1733  Vitals  Temp 98.5 F (36.9 C)  Temp Source Oral  BP (!) 151/61  MAP (mmHg) 86  BP Location Right Arm  BP Method Automatic  Patient Position (if appropriate) Lying  Pulse Rate 62  Pulse Rate Source Monitor  ECG Heart Rate 63  Resp (!) 21  Level of Consciousness  Level of Consciousness Alert  MEWS COLOR  MEWS Score Color Green  Oxygen  Therapy  SpO2 96 %  O2 Device Room Air  MEWS Score  MEWS Temp 0  MEWS Systolic 0  MEWS Pulse 0  MEWS RR 1  MEWS LOC 0  MEWS Score 1

## 2024-06-28 NOTE — Consult Note (Addendum)
 Cardiology Consultation   Patient ID: [name] MRN: [id number]; DOB: [birth year]  Admit date: 06/28/2024 Date of Consult: 06/28/2024  PCP:  Sheryle Carwin, MD   [facility] Providers Cardiologist:  Alvan Carrier, MD  Electrophysiologist:  Eulas FORBES Furbish, MD       Patient Profile: [name] is a 78 y.o. female with a hx of rheumatic valvular disease including MS/AI/AS s/p pericardial MVR, pericardial AVR 2014, chronic HFimpEF, cardioembolic stroke 2013 in setting of LAA thrombus, persistent atrial fib/flutter, HTN, HLD, mild carotid artery disease, obstructive lung disease, remote breast CA, CKD 3b-4, diverticulosis, DM, GERD, syncope felt vasovagal in setting of shoulder pain, hypothyroidism who is being seen 06/28/2024 for the evaluation of possible syncope at the request of Dr. Kathrin.  History of Present Illness: [name] has complex hx as above. She had prior cardioembolic CVA in 2013 with LAA thrombus requiring anticoagulation. She also has known rheumatic valvular heart disease with MS, AS, AI. Prior to valvular replacement, EF was 35-40%. Pre-op cath 07/2013 showed no evidence of CAD. She underwent pericardial MVR/AVR in 2014. Post-op she was switched from Xarelto  to Coumadin  with plan to remain on for at least 3 months then back to DOAC if needed but has remained on Coumadin . She is also known to have history of atrial fib/flutter. She had prior syncope in 2024 felt vasovagal in setting of shoulder pain. She developed recurrence of atrial flutter earlier this year and was seen by Dr. Furbish in 02/2024. (Per EP notes, she had likely been in atrial flutter for 3 years prior to previous DCCV in 01/2023.) At that OV, he recommended initiation of amiodarone  then DCCV. She underwent DCCV 02/2024 to NSR/SB HR 50s-60s prompting lowering of bisoprolol  dose. She had ED visit 03/2024 for SOB/CHF, cardiology follow-up 04/2024 prompting uptitration of torsemide , and another ED visit  05/2024 for SOB/CHF.   She presented to the ED this afternoon with possible syncopal episode at [facility]. Her husband is at bedside and shares the story since the patient is amnestic to the event. He states at baseline she has some chronic memory difficulty due to her stroke. She had a mechanical fall a few weeks ago picking up a piece of popcorn off the ground but he was able to help her up and she did not require care. Today she was in USOH, with baseline chronic DOE and mild edema, but no chest pain. He noticed when she got out of the car she seemed more confused than usual and was walking in the wrong direction. He called for her to come to the correct door. She changed her trajectory but then got momentum walking faster in the other direction before falling face down on the concrete. He does not think she passed out though states someone they were with thought she did. When he tried to speak to her she did not answer at first then was able to answer him but was confused. No seizure activity. EMS was called and she was brought to the hospital. resenting BP 172/68.  CT head/max/c-spine showed 7mm acute intraparenchymal hemorrhage, minimal SAH, chronic microvascular disease, left orbital floor fracture, nasal bone fracture, disc disease. EKG shows NSR with long first degree AVB with LPFB, diffuse nonspecific STTW changes with marked QT prolongation of , largely similar to recent 05/2024 tracing except QT longer. INR 3.4 (?goal INR listed as 2.5-3.5 in anticoag clinic), Cr 1.75, hsTroponins neg x2, lactic acid wnl, H/H ok. CXR shows diffuse interstitial prominence with  bibasilar patchy opacities, which may reflect edema or infection; correlate clinically, stable enlarged cardiomediastinal silhouette with MV/AV prostheses. Neuro surgery recommended to stop anticoagulation and recommended to defer reversal due to comorbidities. She currently offers no complaints. POx 86-88% RA at times, warranting placement of  O2. VS show she required Wallace for a period of time in ED as well.   Past Medical History:  Diagnosis Date   Anemia    Asthma 2010   Atrial fibrillation (HCC)    Atrial flutter (HCC)    Breast carcinoma (HCC) 1995   1995   Cardioembolic stroke (HCC) 01/2012   Right frontal in 01/2012; normal carotid ultrasound; possible LAA thrombus by TEE; virtual complete neurologic recovery   Carotid artery stenosis 1961   Chronic kidney disease, stage 2, mildly decreased GFR    GFR of approximately 60   Diabetes mellitus without complication (HCC) 5+ years ago   Controlled most of time   Diverticulosis of colon (without mention of hemorrhage) 2012   Dr. Golda   Fasting hyperglycemia    120 fasting   Gastroesophageal reflux disease    Gastroparesis    Heart failure with improved ejection fraction (HFimpEF) (HCC) 5+ years   Heart murmur 1961   Hemorrhoids    History of aortic valve replacement with bioprosthetic valve    History of mitral valve replacement with bioprosthetic valve    Hyperlipidemia    Hypertension    pt denies 05/30/13     Dr Alvan chester   Hyponatremia    Hypothyroidism    Rheumatic heart disease    Shortness of breath    Syncope and collapse February, 2024    Past Surgical History:  Procedure Laterality Date   AORTIC VALVE REPLACEMENT N/A 07/18/2013   Procedure: AORTIC VALVE REPLACEMENT (AVR);  Surgeon: Dorise MARLA Fellers, MD;  Location: [facility] OR;  Service: Open Heart Surgery;  Laterality: N/A;   BACK SURGERY     BREAST LUMPECTOMY Right 10/03/1993   CARDIAC CATHETERIZATION  5+ years   CARDIOVERSION N/A 07/26/2013   Procedure: CARDIOVERSION;  Surgeon: Vina LULLA Gull, MD;  Location: [facility] ENDOSCOPY;  Service: Cardiovascular;  Laterality: N/A;   CARDIOVERSION N/A 01/04/2023   Procedure: CARDIOVERSION;  Surgeon: Hobart Powell BRAVO, MD;  Location: [facility] ENDOSCOPY;  Service: Cardiovascular;  Laterality: N/A;   CARDIOVERSION N/A 02/23/2024   Procedure: CARDIOVERSION;  Surgeon:  [name], MD;  Location: [facility] ORS;  Service: Endoscopy;  Laterality: N/A;   COLONOSCOPY  10/03/2010   Negative screening procedure   DILATION AND CURETTAGE OF UTERUS     ESOPHAGEAL MANOMETRY N/A 06/17/2013   Procedure: ESOPHAGEAL MANOMETRY (EM);  Surgeon: Alm JONELLE Gander, MD;  Location: [facility] ENDOSCOPY;  Service: Endoscopy;  Laterality: N/A;   INTRAOPERATIVE TRANSESOPHAGEAL ECHOCARDIOGRAM N/A 07/18/2013   Procedure: INTRAOPERATIVE TRANSESOPHAGEAL ECHOCARDIOGRAM;  Surgeon: Dorise MARLA Fellers, MD;  Location: [facility] OR;  Service: Open Heart Surgery;  Laterality: N/A;   LEFT AND RIGHT HEART CATHETERIZATION WITH CORONARY ANGIOGRAM N/A 07/10/2013   Procedure: LEFT AND RIGHT HEART CATHETERIZATION WITH CORONARY ANGIOGRAM;  Surgeon: Sara JONETTA Cash, MD;  Location: [facility] CATH LAB;  Service: Cardiovascular;  Laterality: N/A;   LUMBAR LAMINECTOMY/DECOMPRESSION MICRODISCECTOMY Right 05/13/2014   Procedure: LUMBAR LAMINECTOMY/DECOMPRESSION MICRODISCECTOMY 1 LEVEL  lumbar four/five;  Surgeon: Darina MALVA Boehringer, MD;  Location: [facility] NEURO ORS;  Service: Neurosurgery;  Laterality: Right;   MITRAL VALVE REPLACEMENT N/A 07/18/2013   Procedure: MITRAL VALVE (MV) REPLACEMENT;  Surgeon: Dorise MARLA Fellers, MD;  Location: [facility] OR;  Service: Open Heart Surgery;  Laterality: N/A;   MITRAL VALVE SURGERY  10/03/1960   [facility], closed mitral valvulotomy by finger fracture   TEE WITHOUT CARDIOVERSION  01/24/2012   Procedure: TRANSESOPHAGEAL ECHOCARDIOGRAM (TEE);  Surgeon: Redell GORMAN Shallow, MD;  Location: [facility] ENDOSCOPY;  Service: Cardiovascular;  Laterality: N/A;   TEE WITHOUT CARDIOVERSION N/A 07/11/2013   Procedure: TRANSESOPHAGEAL ECHOCARDIOGRAM (TEE);  Surgeon: Aleene JINNY Passe, MD;  Location: [facility] ENDOSCOPY;  Service: Cardiovascular;  Laterality: N/A;   TEE WITHOUT CARDIOVERSION N/A 07/26/2013   Procedure: TRANSESOPHAGEAL ECHOCARDIOGRAM (TEE);  Surgeon: Vina LULLA Gull, MD;  Location: [facility] ENDOSCOPY;  Service: Cardiovascular;   Laterality: N/A;   TUBAL LIGATION  10/04/1971     Home Medications:  Prior to Admission medications   Medication Sig Start Date End Date Taking? Authorizing Provider  acetaminophen  (TYLENOL ) 325 MG tablet Take 2 tablets (650 mg total) by mouth every 6 (six) hours as needed. 01/20/24  Yes [name], DO  albuterol  (VENTOLIN  HFA) 108 (90 Base) MCG/ACT inhaler Inhale 2 puffs into the lungs every 6 (six) hours as needed for wheezing or shortness of breath. 08/17/23  Yes [provider]  amiodarone  (PACERONE ) 200 MG tablet Take 1 tablet (200 mg total) by mouth daily. Patient taking differently: Take 200 mg by mouth in the morning. 02/02/24  Yes Mealor, [name], MD  atorvastatin  (LIPITOR) 40 MG tablet TAKE ONE TABLET BY MOUTH ONCE DAILY. Patient taking differently: Take 40 mg by mouth in the morning. 07/24/23  Yes BranchDorn FALCON, MD  azelastine  (ASTELIN ) 0.1 % nasal spray Place 1 spray into both nostrils 2 (two) times daily. Use in each nostril as directed 04/30/24  Yes [name], NP  bisoprolol  (ZEBETA ) 5 MG tablet Take 1 tablet (5 mg total) by mouth daily. Patient taking differently: Take 5 mg by mouth in the morning. 02/23/24  Yes Furth, Cadence H, PA-C  budesonide -formoterol  (SYMBICORT ) 80-4.5 MCG/ACT inhaler One puff first thing in am and 12 hours later Patient taking differently: One puff first thing in am and 12 hours later. Use 1 week then swap to Trelegy for a week. 05/13/24  Yes Darlean Ozell NOVAK, MD  calcium  carbonate (TUMS - DOSED IN MG ELEMENTAL CALCIUM ) 500 MG chewable tablet Chew 2-3 tablets by mouth as needed for indigestion or heartburn.   Yes [provider]  dextromethorphan  15 MG/5ML syrup Take 10 mLs by mouth as needed for cough.   Yes [provider]  escitalopram  (LEXAPRO ) 10 MG tablet Take 10 mg by mouth daily with breakfast. 04/10/15  Yes [provider]  famotidine  (PEPCID ) 20 MG tablet TAKE ONE TABLET BY MOUTH EVERY DAY AFTER  SUPPER 12/25/23  Yes Darlean Ozell NOVAK, MD  FARXIGA  10 MG TABS tablet Take 10 mg by mouth in the morning. 03/28/23  Yes [provider]  fexofenadine (ALLEGRA) 180 MG tablet Take 180 mg by mouth in the morning.   Yes [provider]  fluticasone  (FLONASE ) 50 MCG/ACT nasal spray Place 1 spray into both nostrils in the morning. 09/05/13  Yes BranchDorn FALCON, MD  levothyroxine  (SYNTHROID ) 100 MCG tablet Take 100 mcg by mouth daily before breakfast.   Yes [provider]  losartan  (COZAAR ) 25 MG tablet Take 25 mg by mouth in the morning. 01/22/24  Yes [provider]  metFORMIN  (GLUCOPHAGE ) 500 MG tablet Take 1 tablet (500 mg total) by mouth 2 (two) times daily with a meal. 08/03/13  Yes Collins, Gina L, PA-C  Multiple Vitamin (  MULTIVITAMIN WITH MINERALS) TABS tablet Take 1 tablet by mouth in the morning.   Yes [provider]  pantoprazole  (PROTONIX ) 40 MG tablet TAKE 1 TABLET 30 TO 60 MINUTES BEFORE FIRST MEAL OF THE DAY. Patient taking differently: Take 40 mg by mouth daily before breakfast. 03/15/24  Yes Wert, Ozell NOVAK, MD  Polyethyl Glycol-Propyl Glycol (SYSTANE OP) Place 1 drop into both eyes daily as needed (dry eyes).   Yes [provider]  potassium chloride  SA (KLOR-CON  M) 20 MEQ tablet Take 1 tablet (20 mEq total) by mouth daily. Patient taking differently: Take 20 mEq by mouth in the morning. 11/14/22 06/28/24 Yes Rai, Ripudeep K, MD  torsemide  (DEMADEX ) 20 MG tablet Take 20mg  (1 tab) by mouth every other day alternating with 40mg  (2 tabs) every other day Patient taking differently: Take 20-40 mg by mouth See admin instructions. Take 20mg  (1 tab) by mouth every other day alternating with 40mg  (2 tabs) every other day. 04/12/24  Yes [name], MD  TRELEGY ELLIPTA  100-62.5-25 MCG/ACT AEPB Inhale 1 puff into the lungs daily. 06/25/24  Yes [provider]  warfarin (COUMADIN ) 2.5 MG tablet TAKE AS DIRECTED BY COUMADIN   CLINIC. Patient taking differently: Take 2.5 mg by mouth See admin instructions. TAKE AS DIRECTED BY COUMADIN  CLINIC. Take 1 tablet by mouth every day, except on Thursday and Sunday take a 1/2 tablet. 05/13/24  Yes [name], MD    Scheduled Meds:  NOREEN ON 06/29/2024] amiodarone   200 mg Oral Daily   arformoterol   15 mcg Nebulization BID   [START ON 06/29/2024] atorvastatin   40 mg Oral q AM   budesonide  (PULMICORT ) nebulizer solution  0.5 mg Nebulization BID   [START ON 06/29/2024] dapagliflozin  propanediol  10 mg Oral q AM   [START ON 06/29/2024] escitalopram   10 mg Oral Q breakfast   [START ON 06/29/2024] levothyroxine   100 mcg Oral QAC breakfast   [START ON 06/29/2024] losartan   25 mg Oral q AM   [START ON 06/29/2024] pantoprazole   40 mg Oral QAC breakfast   revefenacin   175 mcg Nebulization Daily   [START ON 06/29/2024] torsemide   30 mg Oral Daily   trimethobenzamide   200 mg Intramuscular Once   Continuous Infusions:  ampicillin -sulbactam (UNASYN ) IV     PRN Meds: acetaminophen  **OR** acetaminophen , HYDROmorphone  (DILAUDID ) injection, ipratropium-albuterol , ondansetron  **OR** ondansetron  (ZOFRAN ) IV, oxyCODONE   Allergies:   No Known Allergies  Social History:   Social History   Socioeconomic History   Marital status: Married    Spouse name: Not on file   Number of children: 1   Years of education: Not on file   Highest education level: Not on file  Occupational History   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  Tobacco Use   Smoking status: Never   Smokeless tobacco: Never  Vaping Use   Vaping status: Never Used  Substance and Sexual Activity   Alcohol use: No    Alcohol/week: 0.0 standard drinks of alcohol   Drug use: No   Sexual activity: Not Currently  Other Topics Concern   Not on file  Social History Narrative   Married with children   No regular exercise   Social Drivers of Health   Financial Resource Strain: Not on file  Food Insecurity:  No Food Insecurity (11/11/2022)   Hunger Vital Sign    Worried About Running Out of Food in the Last Year: Never true    Ran Out of Food in the Last Year:  Never true  Transportation Needs: No Transportation Needs (11/11/2022)   PRAPARE - Administrator, Civil Service (Medical): No    Lack of Transportation (Non-Medical): No  Physical Activity: Not on file  Stress: Not on file  Social Connections: Not on file  Intimate Partner Violence: Not At Risk (11/11/2022)   Humiliation, Afraid, Rape, and Kick questionnaire    Fear of Current or Ex-Partner: No    Emotionally Abused: No    Physically Abused: No    Sexually Abused: No    Family History:   Family History  Problem Relation Age of Onset   Heart disease Brother 73       MI   Rheum arthritis Maternal Grandmother    Asthma Maternal Grandfather    Hypothyroidism Mother    Diabetes Sister    Cirrhosis Father      ROS:  Please see the history of present illness.  All other ROS reviewed and negative.     Physical Exam/Data: Vitals:   06/28/24 1223 06/28/24 1245 06/28/24 1630 06/28/24 1631  BP:  (!) 152/58 (!) 141/55   Pulse:  60 60   Resp:  14 13   Temp:    98.1 F (36.7 C)  TempSrc:    Axillary  SpO2:  100% 92%   Weight: 68 kg     Height: 5' 3 (1.6 m)       Intake/Output Summary (Last 24 hours) at 06/28/2024 1716 Last data filed at 06/28/2024 1247 Gross per 24 hour  Intake 0 ml  Output 0 ml  Net 0 ml      06/28/2024   12:23 PM 06/10/2024   10:23 AM 05/30/2024    7:17 AM  Last 3 Weights  Weight (lbs) 150 lb 156 lb 3.2 oz 156 lb  Weight (kg) 68.04 kg 70.852 kg 70.761 kg     Body mass index is 26.57 kg/m.  General: Elderly well developed F in no acute distress. Head: Diffuse facial trauma/ecchymosis and periorbital swelling Neck: Negative for carotid bruits. JVP not elevated. Lungs: Decreased inspiratory effort without overt wheezes, rales, or rhonchi. Breathing is unlabored. Heart: RRR S1 S2 2/6 SEM  without rubs or gallops.  Abdomen: Soft, non-tender, non-distended with normoactive bowel sounds. No rebound/guarding. Extremities: No clubbing or cyanosis. Trace BLE edema. Distal pedal pulses are 2+ and equal bilaterally. Neuro: Awakens easily, answers simple questions. Cannot remember events leading up to this hospitalization Psych: Calm affect   EKG:  The EKG was personally reviewed and demonstrates:  NSR with long first degree AVB with LPFB, diffuse nonspecific STTW changes with marked QT prolongation of . Largely similar to prior except QT longer.  Telemetry:  Telemetry was personally reviewed and demonstrates:  NSR since arriving to 4n. Data from ED not available at this time.  Relevant CV Studies: 2d echo 12/2023    1. Septal hypokinesis. . Left ventricular ejection fraction, by  estimation, is 55%. Left ventricular diastolic parameters are consistent  with Grade II diastolic dysfunction (pseudonormalization). Elevated left  atrial pressure.   2. Right ventricular systolic function is moderately reduced. The right  ventricular size is mildly enlarged.   3. Left atrial size was mild to moderately dilated.   4. Right atrial size was mildly dilated.   5. S/p MVR (25 mm Edwars Magna-ease prosthesis; procedure date 07/18/13)  Mean gradient through the valve is 6 mm Hg. No significant change from  echo in Feb 2024. The mitral valve has been repaired/replaced. No evidence  of mitral valve regurgitation.   6. S/p AVR (21 mm Edwaards Magna-ease prosthesis; procedure dated  07/18/13) Peak and man gradients through the valve are 12 and 8 mm Hg  respectively Dimensionless index is 0.37 (normal) Compared to echo report  from 2024, no significant change in mean  gradient. . The aortic valve has been repaired/replaced. Aortic valve  regurgitation is not visualized.   Laboratory Data: High Sensitivity Troponin:   Recent Labs  Lab 05/30/24 0727 05/30/24 0919 06/28/24 1219  06/28/24 1420  TROPONINIHS 15 15 16 14      Chemistry Recent Labs  Lab 06/28/24 1219 06/28/24 1222  NA 138 137  K 3.9 3.9  CL 99 99  CO2 25  --   GLUCOSE 121* 122*  BUN 16 22  CREATININE 1.75* 1.90*  CALCIUM  8.8*  --   GFRNONAA 29*  --   ANIONGAP 14  --     Recent Labs  Lab 06/28/24 1219  PROT 7.2  ALBUMIN  3.8  AST 55*  ALT 26  ALKPHOS 62  BILITOT 1.4*   Lipids No results for input(s): CHOL, TRIG, HDL, LABVLDL, LDLCALC, CHOLHDL in the last 168 hours.  Hematology Recent Labs  Lab 06/28/24 1219 06/28/24 1222  WBC 8.0  --   RBC 4.60  --   HGB 12.2 13.9  HCT 39.9 41.0  MCV 86.7  --   MCH 26.5  --   MCHC 30.6  --   RDW 16.3*  --   PLT 191  --    Thyroid  No results for input(s): TSH, FREET4 in the last 168 hours.  BNPNo results for input(s): BNP, PROBNP in the last 168 hours.  DDimer No results for input(s): DDIMER in the last 168 hours.  Radiology/Studies:  CT HEAD WO CONTRAST Result Date: 06/28/2024 CLINICAL DATA:  Syncopal event with head injury and loss of consciousness. Patient on Coumadin . Facial trauma. Nausea and vomiting. EXAM: CT HEAD WITHOUT CONTRAST CT MAXILLOFACIAL WITHOUT CONTRAST CT CERVICAL SPINE WITHOUT CONTRAST TECHNIQUE: Multidetector CT imaging of the head, cervical spine, and maxillofacial structures were performed using the standard protocol without intravenous contrast. Multiplanar CT image reconstructions of the cervical spine and maxillofacial structures were also generated. RADIATION DOSE REDUCTION: This exam was performed according to the departmental dose-optimization program which includes automated exposure control, adjustment of the mA and/or kV according to patient size and/or use of iterative reconstruction technique. COMPARISON:  01/19/2024 FINDINGS: CT HEAD FINDINGS Brain: Ventricles, cisterns and other CSF spaces are normal. There is chronic ischemic microvascular disease. Stable mild encephalomalacia over the  right frontal lobe. 7 mm focus of intraparenchymal acute hemorrhage over the left frontal white matter. Very minimal acute subarachnoid hemorrhage over the adjacent right frontal lobe. No epidural subdural hemorrhage. No significant mass effect or midline shift. Traumatic Brain Injury Risk Stratification Skull Fracture: No - Low/mBIG 1 Subdural Hematoma (SDH): No - Low Subarachnoid Hemorrhage [facility]): Small single focus of acute subarachnoid hemorrhage over the right frontal region adjacent the midline. Epidural Hematoma (EDH): No - Low/mBIG 1 Cerebral contusion, intra-axial, intraparenchymal Hemorrhage (IPH): Single 7 mm focus of acute intraparenchymal hemorrhage over the left frontal white matter. Intraventricular Hemorrhage (IVH): No - Low/mBIG 1 Midline Shift > 1mm or Edema/effacement of sulci/vents: No - Low/mBIG 1 ---------------------------------------------------- Vascular: No hyperdense vessel or unexpected calcification. Skull: Normal. Negative for fracture or focal lesion. Other: Minimally displaced nasal bone fracture as well as fracture of the junction of the left nasal bone to the frontal process of the maxilla. Opacification  over the left maxillary and ethmoid air cells. Minimal opacification over the left sphenoid sinus. Left frontal scalp contusion. CT MAXILLOFACIAL FINDINGS Osseous: Examination demonstrates a depressed comminuted left orbital floor fracture with partial entrapment of inferior rectus. There is also fracture of the medial wall the left orbit without entrapment of intraorbital contents. There is a minimally displaced nasal bone fracture. Subtle fracture of the junction of the left nasal bone to the frontal process of the left axilla. Orbits: Fractures of the medial wall and floor of the left orbit as described above. Left periorbital soft tissue swelling. Globes are normal and symmetric. Retrobulbar spaces are otherwise unremarkable. Sinuses: Hypoplastic frontal sinuses. Hemorrhagic  debris over the and left ethmoid and maxillary sinuses. Mastoid air cells are clear. Soft tissues: Soft tissue swelling adjacent the left mandible as well as over the left nasal bridge and left periorbital region. CT CERVICAL SPINE FINDINGS Alignment: Normal. Skull base and vertebrae: Vertebral body heights are maintained. There is mild spondylosis throughout the cervical spine to include uncovertebral joint spurring and facet arthropathy. No acute fracture. Mild bilateral neural foraminal narrowing at the C5-6 level and C6-7 levels. Soft tissues and spinal canal: No prevertebral fluid or swelling. No visible canal hematoma. Disc levels: Mild disc space narrowing at the C5-6 and C6-7 levels. Central disc protrusion at the C3-4 level with more pronounced central disc protrusion at the C4-5 level. Upper chest: No acute findings. Other: Schmorl's node over the superior endplate of T2 unchanged. IMPRESSION: 1. 7 mm focus of acute intraparenchymal hemorrhage over the left frontal white matter. Very minimal acute subarachnoid hemorrhage over the adjacent right frontal lobe. Findings compatible with minor TBI BIG-2 categorization. 2. Chronic ischemic microvascular disease with stable mild encephalomalacia over the right frontal lobe. 3. Depressed comminuted left orbital floor fracture with partial entrapment of inferior rectus muscle. Fracture of the medial wall the left orbit without entrapment of intraorbital contents. 4. Minimally displaced nasal bone fracture as well as fracture of the junction of the left nasal bone to the frontal process of the left maxilla. 5. No acute cervical spine injury. 6. Mild spondylosis throughout the cervical spine with mild disc disease at the C5-6 and C6-7 levels. Central disc protrusion at the C3-4 level with more pronounced central disc protrusion at the C4-5 level as these findings would be better evaluated with MRI. Critical Value/emergent results were called by telephone at the time  of interpretation on 06/28/2024 at 1:28 pm to provider ANDREW TEE , who verbally acknowledged these results. Electronically Signed   By: Toribio Agreste M.D.   On: 06/28/2024 13:29   CT MAXILLOFACIAL WO CONTRAST Result Date: 06/28/2024 CLINICAL DATA:  Syncopal event with head injury and loss of consciousness. Patient on Coumadin . Facial trauma. Nausea and vomiting. EXAM: CT HEAD WITHOUT CONTRAST CT MAXILLOFACIAL WITHOUT CONTRAST CT CERVICAL SPINE WITHOUT CONTRAST TECHNIQUE: Multidetector CT imaging of the head, cervical spine, and maxillofacial structures were performed using the standard protocol without intravenous contrast. Multiplanar CT image reconstructions of the cervical spine and maxillofacial structures were also generated. RADIATION DOSE REDUCTION: This exam was performed according to the departmental dose-optimization program which includes automated exposure control, adjustment of the mA and/or kV according to patient size and/or use of iterative reconstruction technique. COMPARISON:  01/19/2024 FINDINGS: CT HEAD FINDINGS Brain: Ventricles, cisterns and other CSF spaces are normal. There is chronic ischemic microvascular disease. Stable mild encephalomalacia over the right frontal lobe. 7 mm focus of intraparenchymal acute hemorrhage over the left frontal white  matter. Very minimal acute subarachnoid hemorrhage over the adjacent right frontal lobe. No epidural subdural hemorrhage. No significant mass effect or midline shift. Traumatic Brain Injury Risk Stratification Skull Fracture: No - Low/mBIG 1 Subdural Hematoma (SDH): No - Low Subarachnoid Hemorrhage [facility]): Small single focus of acute subarachnoid hemorrhage over the right frontal region adjacent the midline. Epidural Hematoma (EDH): No - Low/mBIG 1 Cerebral contusion, intra-axial, intraparenchymal Hemorrhage (IPH): Single 7 mm focus of acute intraparenchymal hemorrhage over the left frontal white matter. Intraventricular Hemorrhage (IVH): No -  Low/mBIG 1 Midline Shift > 1mm or Edema/effacement of sulci/vents: No - Low/mBIG 1 ---------------------------------------------------- Vascular: No hyperdense vessel or unexpected calcification. Skull: Normal. Negative for fracture or focal lesion. Other: Minimally displaced nasal bone fracture as well as fracture of the junction of the left nasal bone to the frontal process of the maxilla. Opacification over the left maxillary and ethmoid air cells. Minimal opacification over the left sphenoid sinus. Left frontal scalp contusion. CT MAXILLOFACIAL FINDINGS Osseous: Examination demonstrates a depressed comminuted left orbital floor fracture with partial entrapment of inferior rectus. There is also fracture of the medial wall the left orbit without entrapment of intraorbital contents. There is a minimally displaced nasal bone fracture. Subtle fracture of the junction of the left nasal bone to the frontal process of the left axilla. Orbits: Fractures of the medial wall and floor of the left orbit as described above. Left periorbital soft tissue swelling. Globes are normal and symmetric. Retrobulbar spaces are otherwise unremarkable. Sinuses: Hypoplastic frontal sinuses. Hemorrhagic debris over the and left ethmoid and maxillary sinuses. Mastoid air cells are clear. Soft tissues: Soft tissue swelling adjacent the left mandible as well as over the left nasal bridge and left periorbital region. CT CERVICAL SPINE FINDINGS Alignment: Normal. Skull base and vertebrae: Vertebral body heights are maintained. There is mild spondylosis throughout the cervical spine to include uncovertebral joint spurring and facet arthropathy. No acute fracture. Mild bilateral neural foraminal narrowing at the C5-6 level and C6-7 levels. Soft tissues and spinal canal: No prevertebral fluid or swelling. No visible canal hematoma. Disc levels: Mild disc space narrowing at the C5-6 and C6-7 levels. Central disc protrusion at the C3-4 level with  more pronounced central disc protrusion at the C4-5 level. Upper chest: No acute findings. Other: Schmorl's node over the superior endplate of T2 unchanged. IMPRESSION: 1. 7 mm focus of acute intraparenchymal hemorrhage over the left frontal white matter. Very minimal acute subarachnoid hemorrhage over the adjacent right frontal lobe. Findings compatible with minor TBI BIG-2 categorization. 2. Chronic ischemic microvascular disease with stable mild encephalomalacia over the right frontal lobe. 3. Depressed comminuted left orbital floor fracture with partial entrapment of inferior rectus muscle. Fracture of the medial wall the left orbit without entrapment of intraorbital contents. 4. Minimally displaced nasal bone fracture as well as fracture of the junction of the left nasal bone to the frontal process of the left maxilla. 5. No acute cervical spine injury. 6. Mild spondylosis throughout the cervical spine with mild disc disease at the C5-6 and C6-7 levels. Central disc protrusion at the C3-4 level with more pronounced central disc protrusion at the C4-5 level as these findings would be better evaluated with MRI. Critical Value/emergent results were called by telephone at the time of interpretation on 06/28/2024 at 1:28 pm to provider ANDREW TEE , who verbally acknowledged these results. Electronically Signed   By: Toribio Agreste M.D.   On: 06/28/2024 13:29   CT CERVICAL SPINE WO CONTRAST Result  Date: 06/28/2024 CLINICAL DATA:  Syncopal event with head injury and loss of consciousness. Patient on Coumadin . Facial trauma. Nausea and vomiting. EXAM: CT HEAD WITHOUT CONTRAST CT MAXILLOFACIAL WITHOUT CONTRAST CT CERVICAL SPINE WITHOUT CONTRAST TECHNIQUE: Multidetector CT imaging of the head, cervical spine, and maxillofacial structures were performed using the standard protocol without intravenous contrast. Multiplanar CT image reconstructions of the cervical spine and maxillofacial structures were also generated.  RADIATION DOSE REDUCTION: This exam was performed according to the departmental dose-optimization program which includes automated exposure control, adjustment of the mA and/or kV according to patient size and/or use of iterative reconstruction technique. COMPARISON:  01/19/2024 FINDINGS: CT HEAD FINDINGS Brain: Ventricles, cisterns and other CSF spaces are normal. There is chronic ischemic microvascular disease. Stable mild encephalomalacia over the right frontal lobe. 7 mm focus of intraparenchymal acute hemorrhage over the left frontal white matter. Very minimal acute subarachnoid hemorrhage over the adjacent right frontal lobe. No epidural subdural hemorrhage. No significant mass effect or midline shift. Traumatic Brain Injury Risk Stratification Skull Fracture: No - Low/mBIG 1 Subdural Hematoma (SDH): No - Low Subarachnoid Hemorrhage [facility]): Small single focus of acute subarachnoid hemorrhage over the right frontal region adjacent the midline. Epidural Hematoma (EDH): No - Low/mBIG 1 Cerebral contusion, intra-axial, intraparenchymal Hemorrhage (IPH): Single 7 mm focus of acute intraparenchymal hemorrhage over the left frontal white matter. Intraventricular Hemorrhage (IVH): No - Low/mBIG 1 Midline Shift > 1mm or Edema/effacement of sulci/vents: No - Low/mBIG 1 ---------------------------------------------------- Vascular: No hyperdense vessel or unexpected calcification. Skull: Normal. Negative for fracture or focal lesion. Other: Minimally displaced nasal bone fracture as well as fracture of the junction of the left nasal bone to the frontal process of the maxilla. Opacification over the left maxillary and ethmoid air cells. Minimal opacification over the left sphenoid sinus. Left frontal scalp contusion. CT MAXILLOFACIAL FINDINGS Osseous: Examination demonstrates a depressed comminuted left orbital floor fracture with partial entrapment of inferior rectus. There is also fracture of the medial wall the left  orbit without entrapment of intraorbital contents. There is a minimally displaced nasal bone fracture. Subtle fracture of the junction of the left nasal bone to the frontal process of the left axilla. Orbits: Fractures of the medial wall and floor of the left orbit as described above. Left periorbital soft tissue swelling. Globes are normal and symmetric. Retrobulbar spaces are otherwise unremarkable. Sinuses: Hypoplastic frontal sinuses. Hemorrhagic debris over the and left ethmoid and maxillary sinuses. Mastoid air cells are clear. Soft tissues: Soft tissue swelling adjacent the left mandible as well as over the left nasal bridge and left periorbital region. CT CERVICAL SPINE FINDINGS Alignment: Normal. Skull base and vertebrae: Vertebral body heights are maintained. There is mild spondylosis throughout the cervical spine to include uncovertebral joint spurring and facet arthropathy. No acute fracture. Mild bilateral neural foraminal narrowing at the C5-6 level and C6-7 levels. Soft tissues and spinal canal: No prevertebral fluid or swelling. No visible canal hematoma. Disc levels: Mild disc space narrowing at the C5-6 and C6-7 levels. Central disc protrusion at the C3-4 level with more pronounced central disc protrusion at the C4-5 level. Upper chest: No acute findings. Other: Schmorl's node over the superior endplate of T2 unchanged. IMPRESSION: 1. 7 mm focus of acute intraparenchymal hemorrhage over the left frontal white matter. Very minimal acute subarachnoid hemorrhage over the adjacent right frontal lobe. Findings compatible with minor TBI BIG-2 categorization. 2. Chronic ischemic microvascular disease with stable mild encephalomalacia over the right frontal lobe. 3. Depressed comminuted left  orbital floor fracture with partial entrapment of inferior rectus muscle. Fracture of the medial wall the left orbit without entrapment of intraorbital contents. 4. Minimally displaced nasal bone fracture as well as  fracture of the junction of the left nasal bone to the frontal process of the left maxilla. 5. No acute cervical spine injury. 6. Mild spondylosis throughout the cervical spine with mild disc disease at the C5-6 and C6-7 levels. Central disc protrusion at the C3-4 level with more pronounced central disc protrusion at the C4-5 level as these findings would be better evaluated with MRI. Critical Value/emergent results were called by telephone at the time of interpretation on 06/28/2024 at 1:28 pm to provider ANDREW TEE , who verbally acknowledged these results. Electronically Signed   By: Toribio Agreste M.D.   On: 06/28/2024 13:29   DG Chest Port 1 View Result Date: 06/28/2024 EXAM: 1 VIEW(S) XRAY OF THE CHEST 06/28/2024 12:31:00 PM COMPARISON: 05/30/2024 CLINICAL HISTORY: trauma; fall on thinners FINDINGS: LUNGS AND PLEURA: Diffuse interstitial prominence. Bibasilar patchy opacities. No pulmonary edema. No pleural effusion. No pneumothorax. HEART AND MEDIASTINUM: Similar enlarged cardiomediastinal silhouette status post aortic and mitral valve prostheses. BONES AND SOFT TISSUES: Right axillary surgical clips. Nondisplaced median sternotomy wires. No acute osseous abnormality. IMPRESSION: 1. Diffuse interstitial prominence with bibasilar patchy opacities, which may reflect edema or infection; correlate clinically. 2. Stable enlarged cardiomediastinal silhouette with aortic and mitral valve prostheses. Electronically signed by: Waddell Calk MD 06/28/2024 01:06 PM EDT RP Workstation: [id number]     Assessment and Plan:  1. Severe fall with intracranial bleeding, orbital floor fracture, nasal bone fracture, possible syncope 2. Rheumatic heart disease s/p pericardial MVR, AVR in 2014 3. History of persistent atrial fib/flutter s/p prior DCCVs last in 02/2024, on chronic amiodarone  + warfarin with prolonged QT interval and 1st degree AVB 4. Chronic anticoagulation with outpatient INR goal 2.5-3.5 which may need  revision if she becomes eligible for anticoagulation in the future 5. High baseline stroke risk with history of cardioembolic stroke in 2013 6. CKD stage 3b-4 7. Mild carotid artery disease 8. Chronic HFimpEF  Very complex clinical case with high stroke risk matched against a very high risk fall with multiple traumatic injuries to the face and head. Unclear if this was a mechanical fall or syncope or combination of the two. EKG shows NSR with long first degree AVB and prolonged QT interval. Echocardiogram and carotid study have been ordered. She is also requiring supplemental O2 to maintain O2 sats with question of volume overload on CXR. Discussed case with Dr. Francyne who will evaluate patient and provide further recommendations.  Risk Assessment/Risk Scores:      [place]  Heart Association (NYHA) Functional Class NYHA Class III  CHA2DS2-VASc Score = 9   This indicates a 12.2% annual risk of stroke. The patient's score is based upon: CHF History: 1 HTN History: 1 Diabetes History: 1 Stroke History: 2 Vascular Disease History: 1 Age Score: 2 Gender Score: 1      For questions or updates, please contact [facility] Please consult [email or web address] for contact info under      Signed, Dayna N Dunn, PA-C  06/28/2024 5:16 PM  Patient seen and examined.  Agree with above documentation.  [name] is an 78 year old female with a history of rheumatic heart disease status post bioprosthetic MVR/AVR in 2014, CVA in 2013 in setting of left atrial appendage thrombus, heart failure with improved EF, persistent atrial fibrillation/flutter, obstructive lung disease, CKD, breast cancer, hypothyroidism  we are consulted by Dr. Gonfa for evaluation of possible syncope.  She was found to have rheumatic valvular disease with mitral stenosis and aortic stenosis/regurgitation in 2014.  Underwent bioprosthetic MVR/AVR at that time.  No CAD on cath prior to surgery.  EF was 35 to 40% at that  time.  She had syncopal episode in 2024 felt to be vasovagal due to shoulder pain.  She has had recent issues with atrial flutter.  Most recent echocardiogram 12/2023 showed EF 55%, moderately reduced RV function, normal functioning bioprosthetic AVR and MVR.  She saw EP 02/2024, started on amiodarone  and underwent DCCV 02/2024.  She presented to ED today after syncopal episode.  She does not remember event.  Her husband states that she seemed more confused than usual.  They had parked at [facility] and were getting out of the car when she started to walk in the wrong direction.  He called for her and she changed directions and started to walk faster and and then fell face down.  Husband did not think she had syncopal episode.  No seizure activity noted.  EMS was called.  On arrival to ED, her initial vital signs notable for BP 172/68, Pulse 69, SpO2 100% on Room Air.  Labs Notable for Creatinine 1.75, Troponin 16, Lactate 1.9, Hemoglobin 12.2, INR 3.4.  Head CT Showed 7 Mm intraparenchymal hemorrhage over the left frontal white matter and minimal acute subarachnoid hemorrhage over adjacent right frontal lobe, left orbital floor fracture and nasal bone fracture.  Neurosurgery consulted and recommended holding warfarin but not reversing at this time.  EKG shows sinus rhythm, rate 65, first-degree AV block, QTc 539. On exam, patient is alert, oriented to person and place (baseline per husband), regular rate and rhythm, no murmurs, lungs CTAB, trace lower extremity edema, unable to assess for JVD with cervical collar in place.  Agree with holding warfarin per neurosurgery.  Unclear if episode represented mechanical fall versus syncope; her husband does not think she had a syncopal episode.  Will update echocardiogram.  Continue to monitor on telemetry, likely plan monitor on discharge.  QTc is prolonged, would avoid QT prolonging agents, maintain K greater than 4, mag greater than 2, and monitor with repeat EKG  tomorrow.  Sara LITTIE Nanas, MD

## 2024-06-28 NOTE — ED Notes (Signed)
 CCMD called, pt on monitor

## 2024-06-28 NOTE — Progress Notes (Signed)
 MRI called this RN to notify patient was vomiting bright red blood before scanning. This RN went downstairs, and saw small amount of BRB vomit in emesis bag. This RN administered PRN IV zofran . Patient reported feeling a bit better. MRI is going to attempt to do scan again.

## 2024-06-28 NOTE — Consult Note (Signed)
 CC:  Chief Complaint  Patient presents with   Loss of Consciousness    HPI: [name] is a 78 y.o. female w/ PMH below who presents for evaluation of GLF.  Pt says she fell 'face-first' and now says that the left eye 'doesn't feel good'. Symptoms started earlier today.  She is unable to open the eye without assistance, but when opened she says the vision is blurry.   ROS: Negative except as otherwise stated.  PMH: Past Medical History:  Diagnosis Date   Anemia    Asthma 2010   Atrial fibrillation (HCC)    Atrial flutter (HCC)    Breast carcinoma (HCC) 1995   1995   Cardioembolic stroke (HCC) 01/2012   Right frontal in 01/2012; normal carotid ultrasound; possible LAA thrombus by TEE; virtual complete neurologic recovery   Carotid artery stenosis 1961   Chronic kidney disease, stage 2, mildly decreased GFR    GFR of approximately 60   Diabetes mellitus without complication (HCC) 5+ years ago   Controlled most of time   Diverticulosis of colon (without mention of hemorrhage) 2012   Dr. Golda   Fasting hyperglycemia    120 fasting   Gastroesophageal reflux disease    Gastroparesis    Heart failure with improved ejection fraction (HFimpEF) (HCC) 5+ years   Heart murmur 1961   Hemorrhoids    History of aortic valve replacement with bioprosthetic valve    History of mitral valve replacement with bioprosthetic valve    Hyperlipidemia    Hypertension    pt denies 05/30/13     Dr Alvan chester   Hyponatremia    Hypothyroidism    Rheumatic heart disease    Shortness of breath    Syncope and collapse February, 2024    PSH: Past Surgical History:  Procedure Laterality Date   AORTIC VALVE REPLACEMENT N/A 07/18/2013   Procedure: AORTIC VALVE REPLACEMENT (AVR);  Surgeon: Dorise MARLA Fellers, MD;  Location: [facility] OR;  Service: Open Heart Surgery;  Laterality: N/A;   BACK SURGERY     BREAST LUMPECTOMY Right 10/03/1993   CARDIAC CATHETERIZATION  5+ years   CARDIOVERSION N/A  07/26/2013   Procedure: CARDIOVERSION;  Surgeon: Vina LULLA Gull, MD;  Location: [facility] ENDOSCOPY;  Service: Cardiovascular;  Laterality: N/A;   CARDIOVERSION N/A 01/04/2023   Procedure: CARDIOVERSION;  Surgeon: Hobart Powell BRAVO, MD;  Location: [facility] ENDOSCOPY;  Service: Cardiovascular;  Laterality: N/A;   CARDIOVERSION N/A 02/23/2024   Procedure: CARDIOVERSION;  Surgeon: [name], MD;  Location: [facility] ORS;  Service: Endoscopy;  Laterality: N/A;   COLONOSCOPY  10/03/2010   Negative screening procedure   DILATION AND CURETTAGE OF UTERUS     ESOPHAGEAL MANOMETRY N/A 06/17/2013   Procedure: ESOPHAGEAL MANOMETRY (EM);  Surgeon: Alm JONELLE Gander, MD;  Location: [facility] ENDOSCOPY;  Service: Endoscopy;  Laterality: N/A;   INTRAOPERATIVE TRANSESOPHAGEAL ECHOCARDIOGRAM N/A 07/18/2013   Procedure: INTRAOPERATIVE TRANSESOPHAGEAL ECHOCARDIOGRAM;  Surgeon: Dorise MARLA Fellers, MD;  Location: [facility] OR;  Service: Open Heart Surgery;  Laterality: N/A;   LEFT AND RIGHT HEART CATHETERIZATION WITH CORONARY ANGIOGRAM N/A 07/10/2013   Procedure: LEFT AND RIGHT HEART CATHETERIZATION WITH CORONARY ANGIOGRAM;  Surgeon: Lonni JONETTA Cash, MD;  Location: [facility] CATH LAB;  Service: Cardiovascular;  Laterality: N/A;   LUMBAR LAMINECTOMY/DECOMPRESSION MICRODISCECTOMY Right 05/13/2014   Procedure: LUMBAR LAMINECTOMY/DECOMPRESSION MICRODISCECTOMY 1 LEVEL  lumbar four/five;  Surgeon: Darina MALVA Boehringer, MD;  Location: [facility] NEURO ORS;  Service: Neurosurgery;  Laterality: Right;   MITRAL VALVE  REPLACEMENT N/A 07/18/2013   Procedure: MITRAL VALVE (MV) REPLACEMENT;  Surgeon: Dorise MARLA Fellers, MD;  Location: [facility] OR;  Service: Open Heart Surgery;  Laterality: N/A;   MITRAL VALVE SURGERY  10/03/1960   [facility], closed mitral valvulotomy by finger fracture   TEE WITHOUT CARDIOVERSION  01/24/2012   Procedure: TRANSESOPHAGEAL ECHOCARDIOGRAM (TEE);  Surgeon: Redell GORMAN Shallow, MD;  Location: [facility] ENDOSCOPY;  Service: Cardiovascular;  Laterality: N/A;   TEE  WITHOUT CARDIOVERSION N/A 07/11/2013   Procedure: TRANSESOPHAGEAL ECHOCARDIOGRAM (TEE);  Surgeon: Aleene JINNY Passe, MD;  Location: [facility] ENDOSCOPY;  Service: Cardiovascular;  Laterality: N/A;   TEE WITHOUT CARDIOVERSION N/A 07/26/2013   Procedure: TRANSESOPHAGEAL ECHOCARDIOGRAM (TEE);  Surgeon: Vina LULLA Gull, MD;  Location: [facility] ENDOSCOPY;  Service: Cardiovascular;  Laterality: N/A;   TUBAL LIGATION  10/04/1971    Meds: No current facility-administered medications on file prior to encounter.   Current Outpatient Medications on File Prior to Encounter  Medication Sig Dispense Refill   acetaminophen  (TYLENOL ) 325 MG tablet Take 2 tablets (650 mg total) by mouth every 6 (six) hours as needed. 36 tablet 0   albuterol  (VENTOLIN  HFA) 108 (90 Base) MCG/ACT inhaler Inhale 2 puffs into the lungs every 6 (six) hours as needed for wheezing or shortness of breath.     amiodarone  (PACERONE ) 200 MG tablet Take 1 tablet (200 mg total) by mouth daily. (Patient taking differently: Take 200 mg by mouth in the morning.) 30 tablet 6   atorvastatin  (LIPITOR) 40 MG tablet TAKE ONE TABLET BY MOUTH ONCE DAILY. (Patient taking differently: Take 40 mg by mouth in the morning.) 90 tablet 3   azelastine  (ASTELIN ) 0.1 % nasal spray Place 1 spray into both nostrils 2 (two) times daily. Use in each nostril as directed 30 mL 1   bisoprolol  (ZEBETA ) 5 MG tablet Take 1 tablet (5 mg total) by mouth daily. (Patient taking differently: Take 5 mg by mouth in the morning.) 90 tablet 3   budesonide -formoterol  (SYMBICORT ) 80-4.5 MCG/ACT inhaler One puff first thing in am and 12 hours later (Patient taking differently: One puff first thing in am and 12 hours later. Use 1 week then swap to Trelegy for a week.) 1 each 12   calcium  carbonate (TUMS - DOSED IN MG ELEMENTAL CALCIUM ) 500 MG chewable tablet Chew 2-3 tablets by mouth as needed for indigestion or heartburn.     dextromethorphan  15 MG/5ML syrup Take 10 mLs by mouth as needed for cough.      escitalopram  (LEXAPRO ) 10 MG tablet Take 10 mg by mouth daily with breakfast.     famotidine  (PEPCID ) 20 MG tablet TAKE ONE TABLET BY MOUTH EVERY DAY AFTER SUPPER 30 tablet 0   FARXIGA  10 MG TABS tablet Take 10 mg by mouth in the morning.     fexofenadine (ALLEGRA) 180 MG tablet Take 180 mg by mouth in the morning.     fluticasone  (FLONASE ) 50 MCG/ACT nasal spray Place 1 spray into both nostrils in the morning.     levothyroxine  (SYNTHROID ) 100 MCG tablet Take 100 mcg by mouth daily before breakfast.     losartan  (COZAAR ) 25 MG tablet Take 25 mg by mouth in the morning.     metFORMIN  (GLUCOPHAGE ) 500 MG tablet Take 1 tablet (500 mg total) by mouth 2 (two) times daily with a meal. 60 tablet 1   Multiple Vitamin (MULTIVITAMIN WITH MINERALS) TABS tablet Take 1 tablet by mouth in the morning.     pantoprazole  (PROTONIX ) 40 MG tablet TAKE 1  TABLET 30 TO 60 MINUTES BEFORE FIRST MEAL OF THE DAY. (Patient taking differently: Take 40 mg by mouth daily before breakfast.) 90 tablet 2   Polyethyl Glycol-Propyl Glycol (SYSTANE OP) Place 1 drop into both eyes daily as needed (dry eyes).     potassium chloride  SA (KLOR-CON  M) 20 MEQ tablet Take 1 tablet (20 mEq total) by mouth daily. (Patient taking differently: Take 20 mEq by mouth in the morning.) 30 tablet 0   torsemide  (DEMADEX ) 20 MG tablet Take 20mg  (1 tab) by mouth every other day alternating with 40mg  (2 tabs) every other day (Patient taking differently: Take 20-40 mg by mouth See admin instructions. Take 20mg  (1 tab) by mouth every other day alternating with 40mg  (2 tabs) every other day.) 135 tablet 1   TRELEGY ELLIPTA  100-62.5-25 MCG/ACT AEPB Inhale 1 puff into the lungs daily.     warfarin (COUMADIN ) 2.5 MG tablet TAKE AS DIRECTED BY COUMADIN  CLINIC. (Patient taking differently: Take 2.5 mg by mouth See admin instructions. TAKE AS DIRECTED BY COUMADIN  CLINIC. Take 1 tablet by mouth every day, except on Thursday and Sunday take a 1/2 tablet.) 45 tablet  3    SH: Social History   Socioeconomic History   Marital status: Married    Spouse name: Not on file   Number of children: 1   Years of education: Not on file   Highest education level: Not on file  Occupational History   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  Tobacco Use   Smoking status: Never   Smokeless tobacco: Never  Vaping Use   Vaping status: Never Used  Substance and Sexual Activity   Alcohol use: No    Alcohol/week: 0.0 standard drinks of alcohol   Drug use: No   Sexual activity: Not Currently  Other Topics Concern   Not on file  Social History Narrative   Married with children   No regular exercise   Social Drivers of Health   Financial Resource Strain: Not on file  Food Insecurity: No Food Insecurity (11/11/2022)   Hunger Vital Sign    Worried About Running Out of Food in the Last Year: Never true    Ran Out of Food in the Last Year: Never true  Transportation Needs: No Transportation Needs (11/11/2022)   PRAPARE - Administrator, Civil Service (Medical): No    Lack of Transportation (Non-Medical): No  Physical Activity: Not on file  Stress: Not on file  Social Connections: Not on file    FH: Family History  Problem Relation Age of Onset   Heart disease Brother 71       MI   Rheum arthritis Maternal Grandmother    Asthma Maternal Grandfather    Hypothyroidism Mother    Diabetes Sister    Cirrhosis Father      Past Ocular History: none   Last Eye Exam: unknown   Primary Eye Care: unknown   Past Medical History:  Diagnosis Date   Anemia    Asthma 2010   Atrial fibrillation (HCC)    Atrial flutter (HCC)    Breast carcinoma (HCC) 1995   1995   Cardioembolic stroke (HCC) 01/2012   Right frontal in 01/2012; normal carotid ultrasound; possible LAA thrombus by TEE; virtual complete neurologic recovery   Carotid artery stenosis 1961   Chronic kidney disease, stage 2, mildly decreased GFR    GFR of approximately 60    Diabetes mellitus without complication (HCC) 5+ years ago  Controlled most of time   Diverticulosis of colon (without mention of hemorrhage) 2012   Dr. Golda   Fasting hyperglycemia    120 fasting   Gastroesophageal reflux disease    Gastroparesis    Heart failure with improved ejection fraction (HFimpEF) (HCC) 5+ years   Heart murmur 1961   Hemorrhoids    History of aortic valve replacement with bioprosthetic valve    History of mitral valve replacement with bioprosthetic valve    Hyperlipidemia    Hypertension    pt denies 05/30/13     Dr Alvan chester   Hyponatremia    Hypothyroidism    Rheumatic heart disease    Shortness of breath    Syncope and collapse February, 2024     Past Surgical History:  Procedure Laterality Date   AORTIC VALVE REPLACEMENT N/A 07/18/2013   Procedure: AORTIC VALVE REPLACEMENT (AVR);  Surgeon: Dorise MARLA Fellers, MD;  Location: [facility] OR;  Service: Open Heart Surgery;  Laterality: N/A;   BACK SURGERY     BREAST LUMPECTOMY Right 10/03/1993   CARDIAC CATHETERIZATION  5+ years   CARDIOVERSION N/A 07/26/2013   Procedure: CARDIOVERSION;  Surgeon: Vina LULLA Gull, MD;  Location: [facility] ENDOSCOPY;  Service: Cardiovascular;  Laterality: N/A;   CARDIOVERSION N/A 01/04/2023   Procedure: CARDIOVERSION;  Surgeon: Hobart Powell BRAVO, MD;  Location: [facility] ENDOSCOPY;  Service: Cardiovascular;  Laterality: N/A;   CARDIOVERSION N/A 02/23/2024   Procedure: CARDIOVERSION;  Surgeon: [name], MD;  Location: [facility] ORS;  Service: Endoscopy;  Laterality: N/A;   COLONOSCOPY  10/03/2010   Negative screening procedure   DILATION AND CURETTAGE OF UTERUS     ESOPHAGEAL MANOMETRY N/A 06/17/2013   Procedure: ESOPHAGEAL MANOMETRY (EM);  Surgeon: Alm JONELLE Gander, MD;  Location: [facility] ENDOSCOPY;  Service: Endoscopy;  Laterality: N/A;   INTRAOPERATIVE TRANSESOPHAGEAL ECHOCARDIOGRAM N/A 07/18/2013   Procedure: INTRAOPERATIVE TRANSESOPHAGEAL ECHOCARDIOGRAM;  Surgeon: Dorise MARLA Fellers, MD;  Location: [facility] OR;  Service: Open Heart Surgery;  Laterality: N/A;   LEFT AND RIGHT HEART CATHETERIZATION WITH CORONARY ANGIOGRAM N/A 07/10/2013   Procedure: LEFT AND RIGHT HEART CATHETERIZATION WITH CORONARY ANGIOGRAM;  Surgeon: Lonni JONETTA Cash, MD;  Location: [facility] CATH LAB;  Service: Cardiovascular;  Laterality: N/A;   LUMBAR LAMINECTOMY/DECOMPRESSION MICRODISCECTOMY Right 05/13/2014   Procedure: LUMBAR LAMINECTOMY/DECOMPRESSION MICRODISCECTOMY 1 LEVEL  lumbar four/five;  Surgeon: Darina MALVA Boehringer, MD;  Location: [facility] NEURO ORS;  Service: Neurosurgery;  Laterality: Right;   MITRAL VALVE REPLACEMENT N/A 07/18/2013   Procedure: MITRAL VALVE (MV) REPLACEMENT;  Surgeon: Dorise MARLA Fellers, MD;  Location: [facility] OR;  Service: Open Heart Surgery;  Laterality: N/A;   MITRAL VALVE SURGERY  10/03/1960   [facility], closed mitral valvulotomy by finger fracture   TEE WITHOUT CARDIOVERSION  01/24/2012   Procedure: TRANSESOPHAGEAL ECHOCARDIOGRAM (TEE);  Surgeon: Redell GORMAN Shallow, MD;  Location: [facility] ENDOSCOPY;  Service: Cardiovascular;  Laterality: N/A;   TEE WITHOUT CARDIOVERSION N/A 07/11/2013   Procedure: TRANSESOPHAGEAL ECHOCARDIOGRAM (TEE);  Surgeon: Aleene JINNY Passe, MD;  Location: [facility] ENDOSCOPY;  Service: Cardiovascular;  Laterality: N/A;   TEE WITHOUT CARDIOVERSION N/A 07/26/2013   Procedure: TRANSESOPHAGEAL ECHOCARDIOGRAM (TEE);  Surgeon: Vina LULLA Gull, MD;  Location: [facility] ENDOSCOPY;  Service: Cardiovascular;  Laterality: N/A;   TUBAL LIGATION  10/04/1971     Social History   Socioeconomic History   Marital status: Married    Spouse name: Not on file   Number of children: 1   Years of education: Not on file   Highest  education level: Not on file  Occupational History   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  Tobacco Use   Smoking status: Never   Smokeless tobacco: Never  Vaping Use   Vaping status: Never Used  Substance and Sexual Activity   Alcohol use: No    Alcohol/week:  0.0 standard drinks of alcohol   Drug use: No   Sexual activity: Not Currently  Other Topics Concern   Not on file  Social History Narrative   Married with children   No regular exercise   Social Drivers of Health   Financial Resource Strain: Not on file  Food Insecurity: No Food Insecurity (11/11/2022)   Hunger Vital Sign    Worried About Running Out of Food in the Last Year: Never true    Ran Out of Food in the Last Year: Never true  Transportation Needs: No Transportation Needs (11/11/2022)   PRAPARE - Administrator, Civil Service (Medical): No    Lack of Transportation (Non-Medical): No  Physical Activity: Not on file  Stress: Not on file  Social Connections: Not on file  Intimate Partner Violence: Not At Risk (11/11/2022)   Humiliation, Afraid, Rape, and Kick questionnaire    Fear of Current or Ex-Partner: No    Emotionally Abused: No    Physically Abused: No    Sexually Abused: No     No Known Allergies   No current facility-administered medications on file prior to encounter.   Current Outpatient Medications on File Prior to Encounter  Medication Sig Dispense Refill   acetaminophen  (TYLENOL ) 325 MG tablet Take 2 tablets (650 mg total) by mouth every 6 (six) hours as needed. 36 tablet 0   albuterol  (VENTOLIN  HFA) 108 (90 Base) MCG/ACT inhaler Inhale 2 puffs into the lungs every 6 (six) hours as needed for wheezing or shortness of breath.     amiodarone  (PACERONE ) 200 MG tablet Take 1 tablet (200 mg total) by mouth daily. (Patient taking differently: Take 200 mg by mouth in the morning.) 30 tablet 6   atorvastatin  (LIPITOR) 40 MG tablet TAKE ONE TABLET BY MOUTH ONCE DAILY. (Patient taking differently: Take 40 mg by mouth in the morning.) 90 tablet 3   azelastine  (ASTELIN ) 0.1 % nasal spray Place 1 spray into both nostrils 2 (two) times daily. Use in each nostril as directed 30 mL 1   bisoprolol  (ZEBETA ) 5 MG tablet Take 1 tablet (5 mg total) by mouth daily.  (Patient taking differently: Take 5 mg by mouth in the morning.) 90 tablet 3   budesonide -formoterol  (SYMBICORT ) 80-4.5 MCG/ACT inhaler One puff first thing in am and 12 hours later (Patient taking differently: One puff first thing in am and 12 hours later. Use 1 week then swap to Trelegy for a week.) 1 each 12   calcium  carbonate (TUMS - DOSED IN MG ELEMENTAL CALCIUM ) 500 MG chewable tablet Chew 2-3 tablets by mouth as needed for indigestion or heartburn.     dextromethorphan  15 MG/5ML syrup Take 10 mLs by mouth as needed for cough.     escitalopram  (LEXAPRO ) 10 MG tablet Take 10 mg by mouth daily with breakfast.     famotidine  (PEPCID ) 20 MG tablet TAKE ONE TABLET BY MOUTH EVERY DAY AFTER SUPPER 30 tablet 0   FARXIGA  10 MG TABS tablet Take 10 mg by mouth in the morning.     fexofenadine (ALLEGRA) 180 MG tablet Take 180 mg by mouth in the morning.     fluticasone  (  FLONASE ) 50 MCG/ACT nasal spray Place 1 spray into both nostrils in the morning.     levothyroxine  (SYNTHROID ) 100 MCG tablet Take 100 mcg by mouth daily before breakfast.     losartan  (COZAAR ) 25 MG tablet Take 25 mg by mouth in the morning.     metFORMIN  (GLUCOPHAGE ) 500 MG tablet Take 1 tablet (500 mg total) by mouth 2 (two) times daily with a meal. 60 tablet 1   Multiple Vitamin (MULTIVITAMIN WITH MINERALS) TABS tablet Take 1 tablet by mouth in the morning.     pantoprazole  (PROTONIX ) 40 MG tablet TAKE 1 TABLET 30 TO 60 MINUTES BEFORE FIRST MEAL OF THE DAY. (Patient taking differently: Take 40 mg by mouth daily before breakfast.) 90 tablet 2   Polyethyl Glycol-Propyl Glycol (SYSTANE OP) Place 1 drop into both eyes daily as needed (dry eyes).     potassium chloride  SA (KLOR-CON  M) 20 MEQ tablet Take 1 tablet (20 mEq total) by mouth daily. (Patient taking differently: Take 20 mEq by mouth in the morning.) 30 tablet 0   torsemide  (DEMADEX ) 20 MG tablet Take 20mg  (1 tab) by mouth every other day alternating with 40mg  (2 tabs) every other  day (Patient taking differently: Take 20-40 mg by mouth See admin instructions. Take 20mg  (1 tab) by mouth every other day alternating with 40mg  (2 tabs) every other day.) 135 tablet 1   TRELEGY ELLIPTA  100-62.5-25 MCG/ACT AEPB Inhale 1 puff into the lungs daily.     warfarin (COUMADIN ) 2.5 MG tablet TAKE AS DIRECTED BY COUMADIN  CLINIC. (Patient taking differently: Take 2.5 mg by mouth See admin instructions. TAKE AS DIRECTED BY COUMADIN  CLINIC. Take 1 tablet by mouth every day, except on Thursday and Sunday take a 1/2 tablet.) 45 tablet 3     ROS    Exam:  General: Awake, Alert, Oriented *3  Vision (near): without correction   OD: J10  OS: J14   Extraocular Motility:  Impaired ductions OS in all directions except nasally.  Pupils  OD: 3mm to 2mm reactive without afferent pupillary defect (APD)  OS: 3mm to 2mm reactive without afferent pupillary defect (APD)   IOP(tonopen)  OD: 27  OS: 25  Slit Lamp Exam:  Lids/Lashes  OD: Normal Lids and lashes, No lesion or injury  OS: edema & ecchymosis  Conjunctiva/Sclera  OD: White and quiet  OS: subconjunctival hemorrhage  Cornea  OD: Clear without abrasion or defect  OS: Clear without abrasion or defect  Anterior Chamber  OD: Deep and quiet  OS: Deep and quiet  Iris  OD: Normal iris architecture  OS: Normal Iris Architecture   Lens  OD: Clear, Without significant opacities  OS: Clear, Without significant opacities  Anterior Vitreous  OD: Clear, without cell  OS: Clear without cell   POSTERIOR POLE EXAM (Dilated with phenylephrine  and tropicamide.  Dilation may last up to 24 hours)  View:   OD: 20/20 view without opacities  OS: 20/20 view without opacities  Vitreous:   OD: Clear, no cell  OS: Clear, no cell  Disc:   OD: flat, sharp margin, with appropriate color  OS: flat, sharp margin, with appropriate color  C:D Ratio:   OD: 0.3  OS: 0.3  Macula  OD: Flat, with appropriate light reflex  OS: Flat  with appropriate light reflex  Vessels  OD: Normal vasculature  OS: Normal vasculature  Periphery  OD: Flat 360 degrees without tear, hole or detachment  OS: Flat 360 degrees without tear, hole or detachment  CT Facial Bones: IMPRESSION: 1. 7 mm focus of acute intraparenchymal hemorrhage over the left frontal white matter. Very minimal acute subarachnoid hemorrhage over the adjacent right frontal lobe. Findings compatible with minor TBI BIG-2 categorization. 2. Chronic ischemic microvascular disease with stable mild encephalomalacia over the right frontal lobe. 3. Depressed comminuted left orbital floor fracture with partial entrapment of inferior rectus muscle. Fracture of the medial wall the left orbit without entrapment of intraorbital contents. 4. Minimally displaced nasal bone fracture as well as fracture of the junction of the left nasal bone to the frontal process of the left maxilla. 5. No acute cervical spine injury. 6. Mild spondylosis throughout the cervical spine with mild disc disease at the C5-6 and C6-7 levels. Central disc protrusion at the C3-4 level with more pronounced central disc protrusion at the C4-5 level as these findings would be better evaluated with MRI.   Assessment and Plan:   Left Orbital Fracture -Pt with GLF earlier today and resulting left orbital fractures -Exam with reduced but symmetric VA and elevated but symmetric IOP.  Pt notably has impaired ductions OS in all directions except nasal. -Ophthalmic exam notable for [facility] OS but otherwise benign. -CT facial bones personally reviewed and I agree with large orbital floor blowout fracture.   -Impairment in EOMs in multiple directions can be seen with orbital fractures, but given the large nature of the blowout and lack of isolated impairment in upgaze I think entrapment is unlikely by clinical exam. -No evidence of globe rupture -Pt would benefit from outpatient follow up as below; ok to call  our office and schedule after discharge  2. Ocular Hypertension, both eyes IOP elevated in both eyes This is unlikely to be related to her injury as it is present in both She would benefit from outpatient follow up to address this issue.  Yena Tisby, MD Ophthalmology [facility] [phone]  Total Time Spent: 30 minutes

## 2024-06-28 NOTE — Progress Notes (Signed)
 Pharmacy Antibiotic Note  [name] is a 78 y.o. female admitted on 06/28/2024 with nasal bone fracture.  Pharmacy has been consulted for Unasyn  dosing.  Scr of 1.9 with pertinent PMH of CKD-3B. Baseline Scr around 1.7.  Plan: Unasyn  3 g Q12H  Continue to monitor SCr  Height: 5' 3 (160 cm) Weight: 68 kg (150 lb) IBW/kg (Calculated) : 52.4  Temp (24hrs), Avg:97.6 F (36.4 C), Min:97.1 F (36.2 C), Max:98.1 F (36.7 C)  Recent Labs  Lab 06/28/24 1219 06/28/24 1221 06/28/24 1222  WBC 8.0  --   --   CREATININE 1.75*  --  1.90*  LATICACIDVEN  --  1.9  --     Estimated Creatinine Clearance: 21.8 mL/min (A) (by C-G formula based on SCr of 1.9 mg/dL (H)).    No Known Allergies  Antimicrobials this admission: 9/26 Unasyn  >> Current  Dose adjustments this admission: Yes - Unasyn  frequency adjusted for CrCl 10-30.  Thank you for allowing pharmacy to be a part of this patient's care.  Prentice DOROTHA Favors, PharmD PGY1 Health-System Pharmacy Administration and Leadership Resident [facility]  06/28/2024 4:56 PM

## 2024-06-28 NOTE — ED Provider Notes (Signed)
 [place] EMERGENCY DEPARTMENT AT [facility] Provider Note   CSN: [id number] Arrival date & time: 06/28/24  1212     Patient presents with: Loss of Consciousness   [name] is a 78 y.o. female.   78 year old female with past medical history of atrial fibrillation on Coumadin  presenting to the emergency department today with a syncopal episode.  The patient was the level 2 trauma due to to being a fall on blood thinners.  The patient did hit her head and lost consciousness.  States she is having a headache.  She did have a few episodes of emesis on the way here today.  She denies any chest pain.  She came to the ER for further evaluation due to these ongoing symptoms.  The patient states she is feeling very lightheaded still.   Loss of Consciousness Associated symptoms: headaches, nausea and vomiting        Prior to Admission medications   Medication Sig Start Date End Date Taking? Authorizing Provider  acetaminophen  (TYLENOL ) 325 MG tablet Take 2 tablets (650 mg total) by mouth every 6 (six) hours as needed. 01/20/24   [name], DO  albuterol  (VENTOLIN  HFA) 108 (90 Base) MCG/ACT inhaler Inhale 2 puffs into the lungs every 6 (six) hours as needed for wheezing or shortness of breath. 08/17/23   [provider]  amiodarone  (PACERONE ) 200 MG tablet Take 1 tablet (200 mg total) by mouth daily. 02/02/24   Mealor, [name], MD  atorvastatin  (LIPITOR) 40 MG tablet TAKE ONE TABLET BY MOUTH ONCE DAILY. 07/24/23   [name], MD  azelastine  (ASTELIN ) 0.1 % nasal spray Place 1 spray into both nostrils 2 (two) times daily. Use in each nostril as directed 04/30/24   [name], NP  bisoprolol  (ZEBETA ) 5 MG tablet Take 1 tablet (5 mg total) by mouth daily. 02/23/24   Furth, Cadence H, PA-C  budesonide -formoterol  (SYMBICORT ) 80-4.5 MCG/ACT inhaler One puff first thing in am and 12 hours later 05/13/24   Wert, Michael B, MD  escitalopram  (LEXAPRO ) 10 MG  tablet Take 10 mg by mouth daily with breakfast. 04/10/15   [provider]  famotidine  (PEPCID ) 20 MG tablet TAKE ONE TABLET BY MOUTH EVERY DAY AFTER SUPPER 12/25/23   Wert, Michael B, MD  FARXIGA  10 MG TABS tablet Take 10 mg by mouth daily. 03/28/23   [provider]  fexofenadine (ALLEGRA) 180 MG tablet Take 180 mg by mouth in the morning.    [provider]  fluticasone  (FLONASE ) 50 MCG/ACT nasal spray Place 1 spray into both nostrils daily. 09/05/13   [name], MD  levothyroxine  (SYNTHROID ) 100 MCG tablet Take 100 mcg by mouth daily before breakfast.    [provider]  losartan  (COZAAR ) 25 MG tablet Take 25 mg by mouth daily. 01/22/24   [provider]  metFORMIN  (GLUCOPHAGE ) 500 MG tablet Take 1 tablet (500 mg total) by mouth 2 (two) times daily with a meal. 08/03/13   Collins, Tillman CROME, PA-C  Multiple Vitamin (MULTIVITAMIN WITH MINERALS) TABS tablet Take 1 tablet by mouth daily.    [provider]  pantoprazole  (PROTONIX ) 40 MG tablet TAKE 1 TABLET 30 TO 60 MINUTES BEFORE FIRST MEAL OF THE DAY. 03/15/24   Wert, Michael B, MD  Polyethyl Glycol-Propyl Glycol (SYSTANE OP) Place 1 drop into both eyes daily as needed (dry eyes).    [provider]  potassium chloride  SA (KLOR-CON  M) 20 MEQ tablet Take 1  tablet (20 mEq total) by mouth daily. 11/14/22 06/10/24  Rai, Nydia POUR, MD  torsemide  (DEMADEX ) 20 MG tablet Take 20mg  (1 tab) by mouth every other day alternating with 40mg  (2 tabs) every other day 04/12/24   [name], MD  TRELEGY ELLIPTA  100-62.5-25 MCG/ACT AEPB Inhale 1 puff into the lungs daily. 06/25/24   [provider]  warfarin (COUMADIN ) 2.5 MG tablet TAKE AS DIRECTED BY COUMADIN  CLINIC. Patient taking differently: Take 2.5 mg by mouth See admin instructions. TAKE AS DIRECTED BY COUMADIN  CLINIC.Take 1 tablet every day except 1/2 tablet on Thursday and Sunday 05/13/24   [name], MD    Allergies:  Patient has no known allergies.    Review of Systems  Cardiovascular:  Positive for syncope.  Gastrointestinal:  Positive for nausea and vomiting.  Neurological:  Positive for headaches.  All other systems reviewed and are negative.   Updated Vital Signs BP (!) 152/58   Pulse 60   Temp (!) 97.1 F (36.2 C) (Temporal)   Resp 14   Ht 5' 3 (1.6 m)   Wt 68 kg   SpO2 100%   BMI 26.57 kg/m   Physical Exam Vitals and nursing note reviewed.   Gen: NAD Eyes: PERRL, EOMI, L periorbital swelling noted HEENT: no oropharyngeal swelling, L sided maxillofacial swelling Neck: trachea midline, no cervical spine tenderness, no stepoffs or deformities Resp: clear to auscultation bilaterally Card: RRR, no murmurs, rubs, or gallops Abd: nontender, nondistended, no seatbelt sign Extremities: no calf tenderness, no edema MSK: no thoracic spinal tenderness, no lumbar spinal tenderness, no step-offs or deformities Vascular: 2+ radial pulses bilaterally, 2+ DP pulses bilaterally Neuro: Alert and oriented x 3, equal strength sensation throughout bilateral upper and lower extremities Skin: no rashes    (all labs ordered are listed, but only abnormal results are displayed) Labs Reviewed  COMPREHENSIVE METABOLIC PANEL WITH GFR - Abnormal; Notable for the following components:      Result Value   Glucose, Bld 121 (*)    Creatinine, Ser 1.75 (*)    Calcium  8.8 (*)    AST 55 (*)    Total Bilirubin 1.4 (*)    GFR, Estimated 29 (*)    All other components within normal limits  CBC - Abnormal; Notable for the following components:   RDW 16.3 (*)    All other components within normal limits  URINALYSIS, ROUTINE W REFLEX MICROSCOPIC - Abnormal; Notable for the following components:   Color, Urine STRAW (*)    Specific Gravity, Urine 1.002 (*)    Glucose, UA >=500 (*)    Bacteria, UA RARE (*)    All other components within normal limits  PROTIME-INR - Abnormal; Notable for the following  components:   Prothrombin Time 36.1 (*)    INR 3.4 (*)    All other components within normal limits  I-STAT CHEM 8, ED - Abnormal; Notable for the following components:   Creatinine, Ser 1.90 (*)    Glucose, Bld 122 (*)    Calcium , Ion 1.01 (*)    All other components within normal limits  ETHANOL  I-STAT CG4 LACTIC ACID, ED  SAMPLE TO BLOOD BANK  TROPONIN I (HIGH SENSITIVITY)  TROPONIN I (HIGH SENSITIVITY)    EKG: EKG Interpretation Date/Time:  Friday June 28 2024 12:26:15 EDT Ventricular Rate:  65 PR Interval:  272 QRS Duration:  101 QT Interval:  518 QTC Calculation: 539 R Axis:   128  Text Interpretation: Sinus or ectopic atrial  rhythm Prolonged PR interval Left posterior fascicular block Abnormal R-wave progression, late transition Nonspecific repol abnormality, diffuse leads Prolonged QT interval Confirmed by Ula Barter [phone]) on 06/28/2024 12:30:49 PM  Radiology: CT HEAD WO CONTRAST Result Date: 06/28/2024 CLINICAL DATA:  Syncopal event with head injury and loss of consciousness. Patient on Coumadin . Facial trauma. Nausea and vomiting. EXAM: CT HEAD WITHOUT CONTRAST CT MAXILLOFACIAL WITHOUT CONTRAST CT CERVICAL SPINE WITHOUT CONTRAST TECHNIQUE: Multidetector CT imaging of the head, cervical spine, and maxillofacial structures were performed using the standard protocol without intravenous contrast. Multiplanar CT image reconstructions of the cervical spine and maxillofacial structures were also generated. RADIATION DOSE REDUCTION: This exam was performed according to the departmental dose-optimization program which includes automated exposure control, adjustment of the mA and/or kV according to patient size and/or use of iterative reconstruction technique. COMPARISON:  01/19/2024 FINDINGS: CT HEAD FINDINGS Brain: Ventricles, cisterns and other CSF spaces are normal. There is chronic ischemic microvascular disease. Stable mild encephalomalacia over the right frontal lobe. 7  mm focus of intraparenchymal acute hemorrhage over the left frontal white matter. Very minimal acute subarachnoid hemorrhage over the adjacent right frontal lobe. No epidural subdural hemorrhage. No significant mass effect or midline shift. Traumatic Brain Injury Risk Stratification Skull Fracture: No - Low/mBIG 1 Subdural Hematoma (SDH): No - Low Subarachnoid Hemorrhage [facility]): Small single focus of acute subarachnoid hemorrhage over the right frontal region adjacent the midline. Epidural Hematoma (EDH): No - Low/mBIG 1 Cerebral contusion, intra-axial, intraparenchymal Hemorrhage (IPH): Single 7 mm focus of acute intraparenchymal hemorrhage over the left frontal white matter. Intraventricular Hemorrhage (IVH): No - Low/mBIG 1 Midline Shift > 1mm or Edema/effacement of sulci/vents: No - Low/mBIG 1 ---------------------------------------------------- Vascular: No hyperdense vessel or unexpected calcification. Skull: Normal. Negative for fracture or focal lesion. Other: Minimally displaced nasal bone fracture as well as fracture of the junction of the left nasal bone to the frontal process of the maxilla. Opacification over the left maxillary and ethmoid air cells. Minimal opacification over the left sphenoid sinus. Left frontal scalp contusion. CT MAXILLOFACIAL FINDINGS Osseous: Examination demonstrates a depressed comminuted left orbital floor fracture with partial entrapment of inferior rectus. There is also fracture of the medial wall the left orbit without entrapment of intraorbital contents. There is a minimally displaced nasal bone fracture. Subtle fracture of the junction of the left nasal bone to the frontal process of the left axilla. Orbits: Fractures of the medial wall and floor of the left orbit as described above. Left periorbital soft tissue swelling. Globes are normal and symmetric. Retrobulbar spaces are otherwise unremarkable. Sinuses: Hypoplastic frontal sinuses. Hemorrhagic debris over the and left  ethmoid and maxillary sinuses. Mastoid air cells are clear. Soft tissues: Soft tissue swelling adjacent the left mandible as well as over the left nasal bridge and left periorbital region. CT CERVICAL SPINE FINDINGS Alignment: Normal. Skull base and vertebrae: Vertebral body heights are maintained. There is mild spondylosis throughout the cervical spine to include uncovertebral joint spurring and facet arthropathy. No acute fracture. Mild bilateral neural foraminal narrowing at the C5-6 level and C6-7 levels. Soft tissues and spinal canal: No prevertebral fluid or swelling. No visible canal hematoma. Disc levels: Mild disc space narrowing at the C5-6 and C6-7 levels. Central disc protrusion at the C3-4 level with more pronounced central disc protrusion at the C4-5 level. Upper chest: No acute findings. Other: Schmorl's node over the superior endplate of T2 unchanged. IMPRESSION: 1. 7 mm focus of acute intraparenchymal hemorrhage over the left frontal white  matter. Very minimal acute subarachnoid hemorrhage over the adjacent right frontal lobe. Findings compatible with minor TBI BIG-2 categorization. 2. Chronic ischemic microvascular disease with stable mild encephalomalacia over the right frontal lobe. 3. Depressed comminuted left orbital floor fracture with partial entrapment of inferior rectus muscle. Fracture of the medial wall the left orbit without entrapment of intraorbital contents. 4. Minimally displaced nasal bone fracture as well as fracture of the junction of the left nasal bone to the frontal process of the left maxilla. 5. No acute cervical spine injury. 6. Mild spondylosis throughout the cervical spine with mild disc disease at the C5-6 and C6-7 levels. Central disc protrusion at the C3-4 level with more pronounced central disc protrusion at the C4-5 level as these findings would be better evaluated with MRI. Critical Value/emergent results were called by telephone at the time of interpretation on  06/28/2024 at 1:28 pm to provider Helio Lack , who verbally acknowledged these results. Electronically Signed   By: Toribio Agreste M.D.   On: 06/28/2024 13:29   CT MAXILLOFACIAL WO CONTRAST Result Date: 06/28/2024 CLINICAL DATA:  Syncopal event with head injury and loss of consciousness. Patient on Coumadin . Facial trauma. Nausea and vomiting. EXAM: CT HEAD WITHOUT CONTRAST CT MAXILLOFACIAL WITHOUT CONTRAST CT CERVICAL SPINE WITHOUT CONTRAST TECHNIQUE: Multidetector CT imaging of the head, cervical spine, and maxillofacial structures were performed using the standard protocol without intravenous contrast. Multiplanar CT image reconstructions of the cervical spine and maxillofacial structures were also generated. RADIATION DOSE REDUCTION: This exam was performed according to the departmental dose-optimization program which includes automated exposure control, adjustment of the mA and/or kV according to patient size and/or use of iterative reconstruction technique. COMPARISON:  01/19/2024 FINDINGS: CT HEAD FINDINGS Brain: Ventricles, cisterns and other CSF spaces are normal. There is chronic ischemic microvascular disease. Stable mild encephalomalacia over the right frontal lobe. 7 mm focus of intraparenchymal acute hemorrhage over the left frontal white matter. Very minimal acute subarachnoid hemorrhage over the adjacent right frontal lobe. No epidural subdural hemorrhage. No significant mass effect or midline shift. Traumatic Brain Injury Risk Stratification Skull Fracture: No - Low/mBIG 1 Subdural Hematoma (SDH): No - Low Subarachnoid Hemorrhage [facility]): Small single focus of acute subarachnoid hemorrhage over the right frontal region adjacent the midline. Epidural Hematoma (EDH): No - Low/mBIG 1 Cerebral contusion, intra-axial, intraparenchymal Hemorrhage (IPH): Single 7 mm focus of acute intraparenchymal hemorrhage over the left frontal white matter. Intraventricular Hemorrhage (IVH): No - Low/mBIG 1 Midline Shift  > 1mm or Edema/effacement of sulci/vents: No - Low/mBIG 1 ---------------------------------------------------- Vascular: No hyperdense vessel or unexpected calcification. Skull: Normal. Negative for fracture or focal lesion. Other: Minimally displaced nasal bone fracture as well as fracture of the junction of the left nasal bone to the frontal process of the maxilla. Opacification over the left maxillary and ethmoid air cells. Minimal opacification over the left sphenoid sinus. Left frontal scalp contusion. CT MAXILLOFACIAL FINDINGS Osseous: Examination demonstrates a depressed comminuted left orbital floor fracture with partial entrapment of inferior rectus. There is also fracture of the medial wall the left orbit without entrapment of intraorbital contents. There is a minimally displaced nasal bone fracture. Subtle fracture of the junction of the left nasal bone to the frontal process of the left axilla. Orbits: Fractures of the medial wall and floor of the left orbit as described above. Left periorbital soft tissue swelling. Globes are normal and symmetric. Retrobulbar spaces are otherwise unremarkable. Sinuses: Hypoplastic frontal sinuses. Hemorrhagic debris over the and left ethmoid and  maxillary sinuses. Mastoid air cells are clear. Soft tissues: Soft tissue swelling adjacent the left mandible as well as over the left nasal bridge and left periorbital region. CT CERVICAL SPINE FINDINGS Alignment: Normal. Skull base and vertebrae: Vertebral body heights are maintained. There is mild spondylosis throughout the cervical spine to include uncovertebral joint spurring and facet arthropathy. No acute fracture. Mild bilateral neural foraminal narrowing at the C5-6 level and C6-7 levels. Soft tissues and spinal canal: No prevertebral fluid or swelling. No visible canal hematoma. Disc levels: Mild disc space narrowing at the C5-6 and C6-7 levels. Central disc protrusion at the C3-4 level with more pronounced central  disc protrusion at the C4-5 level. Upper chest: No acute findings. Other: Schmorl's node over the superior endplate of T2 unchanged. IMPRESSION: 1. 7 mm focus of acute intraparenchymal hemorrhage over the left frontal white matter. Very minimal acute subarachnoid hemorrhage over the adjacent right frontal lobe. Findings compatible with minor TBI BIG-2 categorization. 2. Chronic ischemic microvascular disease with stable mild encephalomalacia over the right frontal lobe. 3. Depressed comminuted left orbital floor fracture with partial entrapment of inferior rectus muscle. Fracture of the medial wall the left orbit without entrapment of intraorbital contents. 4. Minimally displaced nasal bone fracture as well as fracture of the junction of the left nasal bone to the frontal process of the left maxilla. 5. No acute cervical spine injury. 6. Mild spondylosis throughout the cervical spine with mild disc disease at the C5-6 and C6-7 levels. Central disc protrusion at the C3-4 level with more pronounced central disc protrusion at the C4-5 level as these findings would be better evaluated with MRI. Critical Value/emergent results were called by telephone at the time of interpretation on 06/28/2024 at 1:28 pm to provider Jerrol Helmers , who verbally acknowledged these results. Electronically Signed   By: Toribio Agreste M.D.   On: 06/28/2024 13:29   CT CERVICAL SPINE WO CONTRAST Result Date: 06/28/2024 CLINICAL DATA:  Syncopal event with head injury and loss of consciousness. Patient on Coumadin . Facial trauma. Nausea and vomiting. EXAM: CT HEAD WITHOUT CONTRAST CT MAXILLOFACIAL WITHOUT CONTRAST CT CERVICAL SPINE WITHOUT CONTRAST TECHNIQUE: Multidetector CT imaging of the head, cervical spine, and maxillofacial structures were performed using the standard protocol without intravenous contrast. Multiplanar CT image reconstructions of the cervical spine and maxillofacial structures were also generated. RADIATION DOSE REDUCTION:  This exam was performed according to the departmental dose-optimization program which includes automated exposure control, adjustment of the mA and/or kV according to patient size and/or use of iterative reconstruction technique. COMPARISON:  01/19/2024 FINDINGS: CT HEAD FINDINGS Brain: Ventricles, cisterns and other CSF spaces are normal. There is chronic ischemic microvascular disease. Stable mild encephalomalacia over the right frontal lobe. 7 mm focus of intraparenchymal acute hemorrhage over the left frontal white matter. Very minimal acute subarachnoid hemorrhage over the adjacent right frontal lobe. No epidural subdural hemorrhage. No significant mass effect or midline shift. Traumatic Brain Injury Risk Stratification Skull Fracture: No - Low/mBIG 1 Subdural Hematoma (SDH): No - Low Subarachnoid Hemorrhage [facility]): Small single focus of acute subarachnoid hemorrhage over the right frontal region adjacent the midline. Epidural Hematoma (EDH): No - Low/mBIG 1 Cerebral contusion, intra-axial, intraparenchymal Hemorrhage (IPH): Single 7 mm focus of acute intraparenchymal hemorrhage over the left frontal white matter. Intraventricular Hemorrhage (IVH): No - Low/mBIG 1 Midline Shift > 1mm or Edema/effacement of sulci/vents: No - Low/mBIG 1 ---------------------------------------------------- Vascular: No hyperdense vessel or unexpected calcification. Skull: Normal. Negative for fracture or focal lesion. Other:  Minimally displaced nasal bone fracture as well as fracture of the junction of the left nasal bone to the frontal process of the maxilla. Opacification over the left maxillary and ethmoid air cells. Minimal opacification over the left sphenoid sinus. Left frontal scalp contusion. CT MAXILLOFACIAL FINDINGS Osseous: Examination demonstrates a depressed comminuted left orbital floor fracture with partial entrapment of inferior rectus. There is also fracture of the medial wall the left orbit without entrapment of  intraorbital contents. There is a minimally displaced nasal bone fracture. Subtle fracture of the junction of the left nasal bone to the frontal process of the left axilla. Orbits: Fractures of the medial wall and floor of the left orbit as described above. Left periorbital soft tissue swelling. Globes are normal and symmetric. Retrobulbar spaces are otherwise unremarkable. Sinuses: Hypoplastic frontal sinuses. Hemorrhagic debris over the and left ethmoid and maxillary sinuses. Mastoid air cells are clear. Soft tissues: Soft tissue swelling adjacent the left mandible as well as over the left nasal bridge and left periorbital region. CT CERVICAL SPINE FINDINGS Alignment: Normal. Skull base and vertebrae: Vertebral body heights are maintained. There is mild spondylosis throughout the cervical spine to include uncovertebral joint spurring and facet arthropathy. No acute fracture. Mild bilateral neural foraminal narrowing at the C5-6 level and C6-7 levels. Soft tissues and spinal canal: No prevertebral fluid or swelling. No visible canal hematoma. Disc levels: Mild disc space narrowing at the C5-6 and C6-7 levels. Central disc protrusion at the C3-4 level with more pronounced central disc protrusion at the C4-5 level. Upper chest: No acute findings. Other: Schmorl's node over the superior endplate of T2 unchanged. IMPRESSION: 1. 7 mm focus of acute intraparenchymal hemorrhage over the left frontal white matter. Very minimal acute subarachnoid hemorrhage over the adjacent right frontal lobe. Findings compatible with minor TBI BIG-2 categorization. 2. Chronic ischemic microvascular disease with stable mild encephalomalacia over the right frontal lobe. 3. Depressed comminuted left orbital floor fracture with partial entrapment of inferior rectus muscle. Fracture of the medial wall the left orbit without entrapment of intraorbital contents. 4. Minimally displaced nasal bone fracture as well as fracture of the junction of  the left nasal bone to the frontal process of the left maxilla. 5. No acute cervical spine injury. 6. Mild spondylosis throughout the cervical spine with mild disc disease at the C5-6 and C6-7 levels. Central disc protrusion at the C3-4 level with more pronounced central disc protrusion at the C4-5 level as these findings would be better evaluated with MRI. Critical Value/emergent results were called by telephone at the time of interpretation on 06/28/2024 at 1:28 pm to provider Laiden Milles , who verbally acknowledged these results. Electronically Signed   By: Toribio Agreste M.D.   On: 06/28/2024 13:29   DG Chest Port 1 View Result Date: 06/28/2024 EXAM: 1 VIEW(S) XRAY OF THE CHEST 06/28/2024 12:31:00 PM COMPARISON: 05/30/2024 CLINICAL HISTORY: trauma; fall on thinners FINDINGS: LUNGS AND PLEURA: Diffuse interstitial prominence. Bibasilar patchy opacities. No pulmonary edema. No pleural effusion. No pneumothorax. HEART AND MEDIASTINUM: Similar enlarged cardiomediastinal silhouette status post aortic and mitral valve prostheses. BONES AND SOFT TISSUES: Right axillary surgical clips. Nondisplaced median sternotomy wires. No acute osseous abnormality. IMPRESSION: 1. Diffuse interstitial prominence with bibasilar patchy opacities, which may reflect edema or infection; correlate clinically. 2. Stable enlarged cardiomediastinal silhouette with aortic and mitral valve prostheses. Electronically signed by: Waddell Calk MD 06/28/2024 01:06 PM EDT RP Workstation: [id number]     Procedures   Medications Ordered in the ED  ondansetron  (ZOFRAN ) injection 4 mg (4 mg Intravenous Given 06/28/24 1217)  fentaNYL  (SUBLIMAZE ) injection 25 mcg (25 mcg Intravenous Given 06/28/24 1334)                                    Medical Decision Making 78 year old female past medical history of coronary artery disease and atrial fibrillation on Coumadin  presenting to the emergency department today after a syncopal episode.  The  patient did hit her head and did have some nausea or vomiting afterwards.  Will further evaluate her here with a CT scan of her head and cervical spine as well as her maxillofacial bones for further evaluation for acute traumatic injuries.  Will obtain an EKG and keep the patient on the cardiac monitor and attain basic labs Wels and INR to evaluate for arrhythmias, electrolyte abnormalities, or anemia.  Will give the patient Zofran  for her nausea.  I will reevaluate for ultimate disposition but given her age and risk factor she will likely require admission after her trauma workup is complete for syncope evaluation.  The patient trauma workup was revealing for an orbital floor fracture as well as very minimal intracranial hemorrhage.  I did discuss this with Dr. Onetha.  He recommends MRI to further evaluate for possible small stroke which would also evaluate for worsening bleed but will hold off on anticoagulation reversal at this time as the patient is very high risk for thromboembolic events as she is on Coumadin  for a valve replacement and she has had an embolic stroke in the past.  Plan is for holding her Coumadin  and for medical admission.  A call is placed for medical admission.  CRITICAL CARE Performed by: Prentice JONELLE Medicus   Total critical care time: 40 minutes  Critical care time was exclusive of separately billable procedures and treating other patients.  Critical care was necessary to treat or prevent imminent or life-threatening deterioration.  Critical care was time spent personally by me on the following activities: development of treatment plan with patient and/or surrogate as well as nursing, discussions with consultants, evaluation of patient's response to treatment, examination of patient, obtaining history from patient or surrogate, ordering and performing treatments and interventions, ordering and review of laboratory studies, ordering and review of radiographic studies, pulse oximetry  and re-evaluation of patient's condition.   Amount and/or Complexity of Data Reviewed Labs: ordered. Radiology: ordered.  Risk Prescription drug management. Decision regarding hospitalization.        Final diagnoses:  Traumatic intracerebral hemorrhage with loss of consciousness, unspecified laterality, initial encounter (HCC)  Closed fracture of orbital floor, unspecified laterality, initial encounter [facility])    ED Discharge Orders     None          Medicus Prentice JONELLE, MD 06/28/24 1407

## 2024-06-28 NOTE — ED Triage Notes (Addendum)
 Pt here for a syncopal event. Pt was [facility] and had a syncopal event, hit her head, LOC, and is on coumadin . Pt has facial trauma. Pt was nauseous/vomiting. Pt hypertensive. Axox3. Pt had left sided facial droop per EMS.  Pt arrives in c-collar.

## 2024-06-29 ENCOUNTER — Observation Stay (HOSPITAL_COMMUNITY)

## 2024-06-29 DIAGNOSIS — I4819 Other persistent atrial fibrillation: Secondary | ICD-10-CM | POA: Diagnosis not present

## 2024-06-29 DIAGNOSIS — I5032 Chronic diastolic (congestive) heart failure: Secondary | ICD-10-CM | POA: Diagnosis not present

## 2024-06-29 DIAGNOSIS — S0993XA Unspecified injury of face, initial encounter: Secondary | ICD-10-CM

## 2024-06-29 DIAGNOSIS — R55 Syncope and collapse: Secondary | ICD-10-CM

## 2024-06-29 DIAGNOSIS — S01511A Laceration without foreign body of lip, initial encounter: Secondary | ICD-10-CM

## 2024-06-29 DIAGNOSIS — W19XXXA Unspecified fall, initial encounter: Secondary | ICD-10-CM

## 2024-06-29 DIAGNOSIS — S0181XA Laceration without foreign body of other part of head, initial encounter: Secondary | ICD-10-CM

## 2024-06-29 DIAGNOSIS — S0232XA Fracture of orbital floor, left side, initial encounter for closed fracture: Secondary | ICD-10-CM | POA: Diagnosis not present

## 2024-06-29 DIAGNOSIS — S0230XA Fracture of orbital floor, unspecified side, initial encounter for closed fracture: Secondary | ICD-10-CM

## 2024-06-29 DIAGNOSIS — Z952 Presence of prosthetic heart valve: Secondary | ICD-10-CM | POA: Diagnosis not present

## 2024-06-29 DIAGNOSIS — S0632AA Contusion and laceration of left cerebrum with loss of consciousness status unknown, initial encounter: Secondary | ICD-10-CM | POA: Diagnosis not present

## 2024-06-29 LAB — COMPREHENSIVE METABOLIC PANEL WITH GFR
ALT: 19 U/L (ref 0–44)
AST: 25 U/L (ref 15–41)
Albumin: 3.2 g/dL — ABNORMAL LOW (ref 3.5–5.0)
Alkaline Phosphatase: 58 U/L (ref 38–126)
Anion gap: 16 — ABNORMAL HIGH (ref 5–15)
BUN: 15 mg/dL (ref 8–23)
CO2: 28 mmol/L (ref 22–32)
Calcium: 8.2 mg/dL — ABNORMAL LOW (ref 8.9–10.3)
Chloride: 99 mmol/L (ref 98–111)
Creatinine, Ser: 1.56 mg/dL — ABNORMAL HIGH (ref 0.44–1.00)
GFR, Estimated: 33 mL/min — ABNORMAL LOW (ref >60)
Glucose, Bld: 95 mg/dL (ref 70–99)
Potassium: 3.1 mmol/L — ABNORMAL LOW (ref 3.5–5.1)
Sodium: 143 mmol/L (ref 135–145)
Total Bilirubin: 1.1 mg/dL (ref 0.0–1.2)
Total Protein: 6.4 g/dL — ABNORMAL LOW (ref 6.5–8.1)

## 2024-06-29 LAB — CBC
HCT: 35.7 % — ABNORMAL LOW (ref 36.0–46.0)
Hemoglobin: 10.9 g/dL — ABNORMAL LOW (ref 12.0–15.0)
MCH: 26.5 pg (ref 26.0–34.0)
MCHC: 30.5 g/dL (ref 30.0–36.0)
MCV: 86.9 fL (ref 80.0–100.0)
Platelets: 164 K/uL (ref 150–400)
RBC: 4.11 MIL/uL (ref 3.87–5.11)
RDW: 16.1 % — ABNORMAL HIGH (ref 11.5–15.5)
WBC: 6.5 K/uL (ref 4.0–10.5)
nRBC: 0 % (ref 0.0–0.2)

## 2024-06-29 LAB — ECHOCARDIOGRAM COMPLETE
AR max vel: 1.65 cm2
AV Area VTI: 1.55 cm2
AV Area mean vel: 1.67 cm2
AV Mean grad: 12.5 mmHg
AV Peak grad: 24.4 mmHg
Ao pk vel: 2.47 m/s
Area-P 1/2: 1.48 cm2
Height: 63 in
MV VTI: 1.02 cm2
S' Lateral: 2.4 cm
Weight: 2400 [oz_av]

## 2024-06-29 LAB — GLUCOSE, CAPILLARY
Glucose-Capillary: 120 mg/dL — ABNORMAL HIGH (ref 70–99)
Glucose-Capillary: 97 mg/dL (ref 70–99)
Glucose-Capillary: 107 mg/dL — ABNORMAL HIGH (ref 70–99)
Glucose-Capillary: 152 mg/dL — ABNORMAL HIGH (ref 70–99)
Glucose-Capillary: 95 mg/dL (ref 70–99)
Glucose-Capillary: 104 mg/dL — ABNORMAL HIGH (ref 70–99)

## 2024-06-29 LAB — PHOSPHORUS: Phosphorus: 5 mg/dL — ABNORMAL HIGH (ref 2.5–4.6)

## 2024-06-29 LAB — PROTIME-INR
INR: 2.7 — ABNORMAL HIGH (ref 0.8–1.2)
Prothrombin Time: 29.8 s — ABNORMAL HIGH (ref 11.4–15.2)

## 2024-06-29 LAB — MAGNESIUM: Magnesium: 1.7 mg/dL (ref 1.7–2.4)

## 2024-06-29 LAB — APTT: aPTT: 39 s — ABNORMAL HIGH (ref 24–36)

## 2024-06-29 MED ORDER — POTASSIUM CHLORIDE 10 MEQ/100ML IV SOLN
10.0000 meq | INTRAVENOUS | Status: AC
  Administered 2024-06-29 (×2): 10 meq via INTRAVENOUS
  Filled 2024-06-29 (×2): qty 100

## 2024-06-29 MED ORDER — POTASSIUM CHLORIDE CRYS ER 20 MEQ PO TBCR: 20.0000 meq | EXTENDED_RELEASE_TABLET | Freq: Two times a day (BID) | ORAL | Status: DC

## 2024-06-29 MED ORDER — ADULT MULTIVITAMIN W/MINERALS CH
1.0000 | ORAL_TABLET | Freq: Every morning | ORAL | Status: DC
  Administered 2024-06-30 – 2024-07-10 (×11): 1 via ORAL
  Filled 2024-06-29 (×11): qty 1

## 2024-06-29 NOTE — Progress Notes (Signed)
 Progress Note  Patient Name: [name] Date of Encounter: 06/29/2024  Primary Cardiologist: Alvan Carrier, MD   Subjective   Feels bad -- discomfort, headache.  Inpatient Medications    Scheduled Meds:  amiodarone   200 mg Oral Daily   arformoterol   15 mcg Nebulization BID   atorvastatin   40 mg Oral q AM   budesonide  (PULMICORT ) nebulizer solution  0.5 mg Nebulization BID   dapagliflozin  propanediol  10 mg Oral q AM   escitalopram   10 mg Oral Q breakfast   insulin  aspart  0-6 Units Subcutaneous Q4H   levothyroxine   100 mcg Oral QAC breakfast   losartan   25 mg Oral q AM   pantoprazole   40 mg Oral QAC breakfast   revefenacin   175 mcg Nebulization Daily   torsemide   30 mg Oral Daily   trimethobenzamide   200 mg Intramuscular Once   Continuous Infusions:  ampicillin -sulbactam (UNASYN ) IV 3 g (06/29/24 0558)   PRN Meds: acetaminophen  **OR** acetaminophen , HYDROmorphone  (DILAUDID ) injection, ipratropium-albuterol , ondansetron  **OR** ondansetron  (ZOFRAN ) IV, oxyCODONE    Vital Signs    Vitals:   06/28/24 2015 06/28/24 2330 06/29/24 0300 06/29/24 0737  BP: (!) 132/49 (!) 129/50 (!) 108/47 (!) 110/48  Pulse: 61 60  65  Resp: 20 12  15   Temp: 97.8 F (36.6 C) 97.6 F (36.4 C) 98.7 F (37.1 C) 98.7 F (37.1 C)  TempSrc: Oral Oral Oral Oral  SpO2: 98% 97%  93%  Weight:      Height:        Intake/Output Summary (Last 24 hours) at 06/29/2024 0818 Last data filed at 06/29/2024 0610 Gross per 24 hour  Intake 123.67 ml  Output 300 ml  Net -176.33 ml   Filed Weights   06/28/24 1223  Weight: 68 kg    Telemetry    Sinus rhythm - Personally Reviewed  ECG    Sinus rhythm, prolonged QT - Personally Reviewed  Physical Exam  GEN: No acute distress.   Cardiac: RRR, no murmurs, rubs, or gallops.  Respiratory: Clear to auscultation bilaterally. GI: Soft, nontender, non-distended  MS: No edema; Face with ecchymosis, edema   Labs    Chemistry Recent Labs   Lab 06/28/24 1219 06/28/24 1222 06/29/24 0617  NA 138 137 143  K 3.9 3.9 3.1*  CL 99 99 99  CO2 25  --  28  GLUCOSE 121* 122* 95  BUN 16 22 15   CREATININE 1.75* 1.90* 1.56*  CALCIUM  8.8*  --  8.2*  PROT 7.2  --  6.4*  ALBUMIN  3.8  --  3.2*  AST 55*  --  25  ALT 26  --  19  ALKPHOS 62  --  58  BILITOT 1.4*  --  1.1  GFRNONAA 29*  --  33*  ANIONGAP 14  --  16*     Hematology Recent Labs  Lab 06/28/24 1219 06/28/24 1222 06/29/24 0617  WBC 8.0  --  6.5  RBC 4.60  --  4.11  HGB 12.2 13.9 10.9*  HCT 39.9 41.0 35.7*  MCV 86.7  --  86.9  MCH 26.5  --  26.5  MCHC 30.6  --  30.5  RDW 16.3*  --  16.1*  PLT 191  --  164    Cardiac EnzymesNo results for input(s): TROPONINI in the last 168 hours. No results for input(s): TROPIPOC in the last 168 hours.   BNPNo results for input(s): BNP, PROBNP in the last 168 hours.   DDimer No results  for input(s): DDIMER in the last 168 hours.   Summary of Pertinent studies    TTE: Ordered  12/19/2023: EF 55%, Gr II diastolic dysfunction.  Mild to moderately dilated left atrium, normal right atrium.  Status post MVR and AVR with bioprosthesis.  Prosthetic valves appear to be functioning normally.    Patient Profile     78 y.o. female with a history of rheumatic valve disease (MS/AI/AS ) status post pericardial MVR & AVR 2014, CHFimpEF, persistent atrial fibrillation/flutter, stroke in 2013 in the setting of left atrial pended thrombus, mild coronary artery disease, obstructive lung disease, vasovagal syncope seen today for evaluation of possible syncope.  Assessment & Plan    Fall -- possible syncope Resulting in intracranial hemorrhage, orbital fractures Prior syncope years ago attributed to vagal syncope in the setting of shoulder pain Prodrome of confusion prior to fall Witnessed (husband) thinks she tripped and fell rather than had loss of consciousness Possible cardiogenic causes would include recurrence of AF with  post-conversion pause, TdP due to prolonged QT -- it isn't clear though that she had syncope at all. I think ILR would be appropriate -- for management of AF/flutter.  S/P pericardial MVR, AVR in 2014 Valve replacement in 2014 due to rheumatic heart disease  Persistent atrial fibrillation and flutter On chronic amiodarone  Anticoagulation chronically with warfarin, currently held Would continue amiodarone  for now but may DC when more medically stable and anticoagulation has resumed and is therapeutic  Secondary hypercoagulable state History of prior cardioembolic stroke On warfarin chronically due to valvular AF Warfarin held, not reversed per neurosurgery  Prolonged QT On amiodarone  Keep potassium > 4, magnesium  > 2 --aggressively    For questions or updates, please contact [facility] Please consult [email or web address] for contact info under Cardiology/STEMI.      Signed, Eulas FORBES Furbish, MD 06/29/2024, 8:18 AM

## 2024-06-29 NOTE — Progress Notes (Signed)
 Progress Note   Patient: [name] FMW:[id number] DOB: July 14, 1946 DOA: 06/28/2024     0 DOS: the patient was seen and examined on 06/29/2024   Brief hospital course: SHEA SWALLEY is a 78 y.o. female with PMH of HFpEF, A-fib/flutter on warfarin, rheumatic heart disease, AV and MV replacement, COPD, CVA, carotid artery stenosis, CKD-3B, NIDDM-2, hypothyroidism, lumbar laminectomy and decompression and mild cognitive impairment brought to ED by EMS after syncopal episode.   Patient sustained facial injury, CT head cervical and maxillofacial showed 7 mm focus of acute IPH over the left frontal white matter, minimal acute SAH over the distal right frontal lobe, depressed comminuted left orbital floor fracture with bilateral entrapment of inferior rectus muscle, fracture of medial wall of the left orbit without entrapment of intraorbital contents, minimally displaced nasal bone fracture as well as fracture of junction of left nasal bone to the frontal process of left maxilla. Neurosurgery consulted recommended to hold anticoagulation, syncope workup.  ENT and ophthalmology evaluated the patient.  Admitted to TR[facility] service for further management and syncope evaluation.  Assessment and Plan: Syncope and collapse- Patient with history of HFpEF, A-fib, flutter, valvular replacement, carotid artery stenosis. Continue to monitor closely in progressive care unit. Continue telemetry monitoring. Echocardiogram pending. Check orthostatic vitals. Repeat MRI brain reviewed is unremarkable. Cardiology consultation appreciated. Continue to hold beta-blocker.  Avoid QT prolonging drugs.  Head injury Orbital floor fracture, nasal bone fracture- Seen by ENT Dr. Tobie, repaired laceration injuries. Continue IV Unasyn  therapy.  Acute intra parenchymal hemorrhage- Repeat MRI reviewed by neurosurgery. No surgical intervention.  Continue to hold warfarin. Cervical spine MRI unremarkable.  Advised PT OT  evaluation. Advance diet as tolerated.  Hypokalemia- IV Kcl ordered for today. Resumed oral kcl from tomorrow. Will check electrolytes and replete accordingly.  Chronic HFpEF: No exacerbation. Continue torsemide , Farxiga , losartan . Check echocardiogram.  Paroxysmal A-fib/flutter First-degree AV block Continue amiodarone . Hold Coumadin  therapy. Beta-blocker on hold due to.  AV block. Will follow cardiology recommendations.  Essential hypertension: Patient will be continued on losartan , torsemide , Farxiga .  CKD stage IIIb: Baseline creatinine around 1.7. Avoid nephrotoxic drugs.  Hypothyroidism: Home dose Synthroid  resumed.  GERD-continue PPI       Out of bed to chair. Incentive spirometry. Nursing supportive care. Fall, aspiration precautions. Diet:  Diet Orders (From admission, onward)     Start     Ordered   06/29/24 1215  Diet clear liquid Room service appropriate? Yes; Fluid consistency: Thin  Diet effective now       Question Answer Comment  Room service appropriate? Yes   Fluid consistency: Thin      06/29/24 1214           DVT prophylaxis: SCDs Start: 06/28/24 1633  Level of care: Progressive   Code Status: Full Code  Subjective: Patient is seen and examined today morning.  Echocardiogram technician at bedside.  She feels groggy, has pain.  Denies chest pain, shortness of breath.  Did not get out of bed.  Wishes to eat.  Spouse at bedside.  Physical Exam: Vitals:   06/29/24 0300 06/29/24 0737 06/29/24 1122 06/29/24 1125  BP: (!) 108/47 (!) 110/48  (!) 107/47  Pulse:  65  (!) 57  Resp:  15  18  Temp: 98.7 F (37.1 C) 98.7 F (37.1 C) 98.3 F (36.8 C) 98.3 F (36.8 C)  TempSrc: Oral Oral  Oral  SpO2:  93%  99%  Weight:      Height:  General - Elderly ill looking Caucasian female, distress due to pain HEENT - PERRLA, EOMI, lacerations of her face, nasal deviation, tender sinuses. Lung - Clear, basal rales, rhonchi,  wheezes. Heart - S1, S2 heard, no murmurs, rubs, trace pedal edema. Abdomen - Soft, non tender, bowel sounds good Neuro - Alert, awake and oriented, non focal exam. Skin - Warm and dry.  Data Reviewed:      Latest Ref Rng & Units 06/29/2024    6:17 AM 06/28/2024   12:22 PM 06/28/2024   12:19 PM  CBC  WBC 4.0 - 10.5 K/uL 6.5   8.0   Hemoglobin 12.0 - 15.0 g/dL 89.0  86.0  87.7   Hematocrit 36.0 - 46.0 % 35.7  41.0  39.9   Platelets 150 - 400 K/uL 164   191       Latest Ref Rng & Units 06/29/2024    6:17 AM 06/28/2024   12:22 PM 06/28/2024   12:19 PM  BMP  Glucose 70 - 99 mg/dL 95  877  878   BUN 8 - 23 mg/dL 15  22  16    Creatinine 0.44 - 1.00 mg/dL 8.43  8.09  8.24   Sodium 135 - 145 mmol/L 143  137  138   Potassium 3.5 - 5.1 mmol/L 3.1  3.9  3.9   Chloride 98 - 111 mmol/L 99  99  99   CO2 22 - 32 mmol/L 28   25   Calcium  8.9 - 10.3 mg/dL 8.2   8.8    ECHOCARDIOGRAM COMPLETE Result Date: 06/29/2024    ECHOCARDIOGRAM REPORT   Patient Name:   [name] Date of Exam: 06/29/2024 Medical Rec #:  [id number]       Height:       63.0 in Accession #:    [id number]      Weight:       150.0 lb Date of Birth:  1946-01-16       BSA:          1.711 m Patient Age:    78 years        BP:           110/48 mmHg Patient Gender: F               HR:           56 bpm. Exam Location:  Inpatient Procedure: 2D Echo, Cardiac Doppler and Color Doppler (Both Spectral and Color            Flow Doppler were utilized during procedure). Indications:    Syncope R55  History:        Patient has prior history of Echocardiogram examinations, most                 recent 12/19/2023. Stroke and COPD, Arrythmias:Atrial                 Fibrillation and Atrial Flutter, Signs/Symptoms:Chest Pain and                 Syncope; Risk Factors:Hypertension, Diabetes and Dyslipidemia.                 Aortic Valve: 21 mm Edwards valve is present in the aortic                 position. Procedure Date: 07/18/2013.                 Mitral  Valve: 25 mm Celestia  bioprosthetic valve valve is present                 in the mitral position. Procedure Date: 07/18/2013.  Sonographer:    Thea Norlander RCS Referring Phys: MIGNON ONEIDA BUMP IMPRESSIONS  1. S/P MVR with mean gradient 8 mmHg and no MR; s/p AVR with mean gradient 13 mmHg and no AI.  2. Left ventricular ejection fraction, by estimation, is 70 to 75%. The left ventricle has hyperdynamic function. The left ventricle has no regional wall motion abnormalities. Left ventricular diastolic parameters are indeterminate. Elevated left atrial  pressure.  3. Right ventricular systolic function is moderately reduced. The right ventricular size is mildly enlarged.  4. Left atrial size was severely dilated.  5. Right atrial size was mildly dilated.  6. The mitral valve has been repaired/replaced. No evidence of mitral valve regurgitation. Mild mitral stenosis. There is a 25 mm Edwards bioprosthetic valve present in the mitral position. Procedure Date: 07/18/2013.  7. The aortic valve has been repaired/replaced. Aortic valve regurgitation is not visualized. No aortic stenosis is present. There is a 21 mm Edwards valve present in the aortic position. Procedure Date: 07/18/2013.  8. The inferior vena cava is normal in size with greater than 50% respiratory variability, suggesting right atrial pressure of 3 mmHg. FINDINGS  Left Ventricle: Left ventricular ejection fraction, by estimation, is 70 to 75%. The left ventricle has hyperdynamic function. The left ventricle has no regional wall motion abnormalities. The left ventricular internal cavity size was normal in size. There is no left ventricular hypertrophy. Left ventricular diastolic parameters are indeterminate. Elevated left atrial pressure. Right Ventricle: The right ventricular size is mildly enlarged. Right ventricular systolic function is moderately reduced. Left Atrium: Left atrial size was severely dilated. Right Atrium: Right atrial size was mildly  dilated. Pericardium: Trivial pericardial effusion is present. Mitral Valve: The mitral valve has been repaired/replaced. No evidence of mitral valve regurgitation. There is a 25 mm Edwards bioprosthetic valve present in the mitral position. Procedure Date: 07/18/2013. Mild mitral valve stenosis. MV peak gradient, 25.9 mmHg. The mean mitral valve gradient is 8.0 mmHg. Tricuspid Valve: The tricuspid valve is normal in structure. Tricuspid valve regurgitation is trivial. No evidence of tricuspid stenosis. Aortic Valve: The aortic valve has been repaired/replaced. Aortic valve regurgitation is not visualized. No aortic stenosis is present. Aortic valve mean gradient measures 12.5 mmHg. Aortic valve peak gradient measures 24.4 mmHg. Aortic valve area, by VTI measures 1.55 cm. There is a 21 mm Edwards valve present in the aortic position. Procedure Date: 07/18/2013. Pulmonic Valve: The pulmonic valve was normal in structure. Pulmonic valve regurgitation is trivial. No evidence of pulmonic stenosis. Aorta: The aortic root is normal in size and structure. Venous: The inferior vena cava is normal in size with greater than 50% respiratory variability, suggesting right atrial pressure of 3 mmHg. IAS/Shunts: No atrial level shunt detected by color flow Doppler. Additional Comments: S/P MVR with mean gradient 8 mmHg and no MR; s/p AVR with mean gradient 13 mmHg and no AI.  LEFT VENTRICLE PLAX 2D LVIDd:         3.80 cm   Diastology LVIDs:         2.40 cm   LV e' medial:   6.42 cm/s LV PW:         1.10 cm   LV E/e' medial: 29.8 LV IVS:        0.70 cm LVOT diam:     2.10 cm LV  SV:         88 LV SV Index:   52 LVOT Area:     3.46 cm  RIGHT VENTRICLE            IVC RV S prime:     9.73 cm/s  IVC diam: 2.20 cm TAPSE (M-mode): 1.0 cm LEFT ATRIUM             Index        RIGHT ATRIUM           Index LA diam:        3.30 cm 1.93 cm/m   RA Area:     18.20 cm LA Vol (A2C):   59.6 ml 34.86 ml/m  RA Volume:   49.10 ml  28.69 ml/m LA  Vol (A4C):   85.6 ml 50.03 ml/m LA Biplane Vol: 75.7 ml 44.24 ml/m  AORTIC VALVE AV Area (Vmax):    1.65 cm AV Area (Vmean):   1.67 cm AV Area (VTI):     1.55 cm AV Vmax:           247.00 cm/s AV Vmean:          160.500 cm/s AV VTI:            0.568 m AV Peak Grad:      24.4 mmHg AV Mean Grad:      12.5 mmHg LVOT Vmax:         118.00 cm/s LVOT Vmean:        77.400 cm/s LVOT VTI:          0.255 m LVOT/AV VTI ratio: 0.45  AORTA Ao Root diam: 2.70 cm Ao Asc diam:  3.10 cm MITRAL VALVE MV Area (PHT): 1.48 cm     SHUNTS MV Area VTI:   1.02 cm     Systemic VTI:  0.26 m MV Peak grad:  25.9 mmHg    Systemic Diam: 2.10 cm MV Mean grad:  8.0 mmHg MV Vmax:       2.55 m/s MV Vmean:      128.3 cm/s MV Decel Time: 511 msec MV E velocity: 191.00 cm/s MV A velocity: 84.80 cm/s MV E/A ratio:  2.25 Redell Shallow MD Electronically signed by Redell Shallow MD Signature Date/Time: 06/29/2024/12:53:19 PM    Final    MR Cervical Spine Wo Contrast Result Date: 06/28/2024 CLINICAL DATA:  Initial evaluation for acute ataxia. EXAM: MRI CERVICAL SPINE WITHOUT CONTRAST TECHNIQUE: Multiplanar, multisequence MR imaging of the cervical spine was performed. No intravenous contrast was administered. COMPARISON:  CT from earlier the same day. FINDINGS: Alignment: Straightening with slight reversal of the normal cervical lordosis. No significant listhesis. Vertebrae: Mild chronic height loss with endplate Schmorl's node deformity noted at the superior endplate of T2. Vertebral body height otherwise maintained without acute or recent fracture. Bone marrow signal intensity within normal limits. No worrisome osseous lesions. No abnormal marrow edema. Cord: Normal signal and morphology. Posterior Fossa, vertebral arteries, paraspinal tissues: Patchy signal abnormality within the pons, most characteristic of chronic micro ischemic disease. Few small remote left cerebellar infarcts. Craniocervical junction normal. Paraspinous soft tissues within  normal limits. Normal flow voids seen within the vertebral arteries bilaterally. Disc levels: C2-C3: Negative interspace. Mild to moderate left greater than right facet hypertrophy. No stenosis. C3-C4: Central disc protrusion indents the ventral thecal sac. Moderate right with mild left facet hypertrophy. No spinal stenosis. Foramina remain patent. C4-C5: Left paracentral disc protrusion indents the ventral thecal sac. Minimal cord  flattening without cord signal changes. Mild spinal stenosis. Foramina remain patent. C5-C6: Degenerative disc space narrowing with diffuse disc osteophyte complex. Flattening and partial effacement of the ventral thecal sac, asymmetric to the right. Mild spinal stenosis. Severe left with moderate right C6 foraminal narrowing. C6-C7: Degenerative intervertebral disc space narrowing with diffuse disc osteophyte complex. Flattening of the ventral thecal sac with no more than mild spinal stenosis. Moderate left with mild to moderate right C7 foraminal narrowing. C7-T1: Negative interspace. Mild left greater than right facet hypertrophy. No stenosis. IMPRESSION: 1. Normal MRI appearance of the cervical spinal cord. 2. Multilevel cervical spondylosis with resultant mild diffuse spinal stenosis at C4-5 through C6-7. 3. Multifactorial degenerative changes with resultant multilevel foraminal narrowing as above. Notable findings include severe left with moderate right C6 foraminal stenosis, with moderate left and mild to moderate right C7 foraminal narrowing. Electronically Signed   By: Morene Hoard M.D.   On: 06/28/2024 21:23   MR BRAIN WO CONTRAST Result Date: 06/28/2024 CLINICAL DATA:  Initial evaluation for acute neuro deficit, stroke suspected. EXAM: MRI HEAD WITHOUT CONTRAST TECHNIQUE: Multiplanar, multiecho pulse sequences of the brain and surrounding structures were obtained without intravenous contrast. COMPARISON:  CT from earlier the same day. FINDINGS: Brain: Examination  degraded by motion. Generalized age-related cerebral atrophy. Patchy T2/FLAIR hyperintensity about the periventricular and deep white matter as well as the pons, consistent with chronic small vessel ischemic disease, moderate in nature. Encephalomalacia and gliosis involving the subcortical right frontal lobe, consistent with a chronic right ACA distribution infarct. Multiple small remote bilateral cerebellar infarcts, greater on the left. Small remote lacunar infarct noted within the left thalamus. No evidence for acute or subacute ischemia. Gray-white matter differentiation maintained. 1.2 cm acute intraparenchymal hemorrhage present at the subcortical left frontal lobe, corresponding with abnormality on prior CT, likely a small hemorrhagic contusion. Trace acute intraventricular hemorrhage seen layering within the occipital horns of both lateral ventricles. No other acute intracranial hemorrhage. Chronic hemosiderin staining noted at the parafalcine right frontal lobe related to the adjacent right ACA infarct. No other significant chronic intracranial blood products. No mass lesion or midline shift. Ventricular prominence related global parenchymal volume loss of hydrocephalus. No extra-axial fluid collection. Pituitary gland within normal limits. Vascular: Major intracranial vascular flow voids are grossly maintained at the skull base. Skull and upper cervical spine: Cranial junction within normal limits. Bone marrow signal intensity normal. Soft tissue contusion present at the left forehead/periorbital region. Sinuses/Orbits: Acute left orbital floor fracture with scattered blood products within the left maxillary sinus, better evaluated on prior maxillofacial CT. No mastoid effusion. Other: None. IMPRESSION: 1. 1.2 cm acute intraparenchymal hemorrhage at the subcortical left frontal lobe, corresponding with abnormality on prior CT, likely a small hemorrhagic contusion. 2. Trace acute intraventricular  hemorrhage layering within the occipital horns of both lateral ventricles. No hydrocephalus. 3. No other acute intracranial abnormality. 4. Acute left orbital floor fracture, better evaluated on prior maxillofacial CT. Prominent overlying left periorbital soft tissue contusion. 5. Underlying age-related cerebral atrophy with moderate chronic small vessel ischemic disease, with multiple remote ischemic infarcts as above. Electronically Signed   By: Morene Hoard M.D.   On: 06/28/2024 21:03   CT HEAD WO CONTRAST Result Date: 06/28/2024 CLINICAL DATA:  Syncopal event with head injury and loss of consciousness. Patient on Coumadin . Facial trauma. Nausea and vomiting. EXAM: CT HEAD WITHOUT CONTRAST CT MAXILLOFACIAL WITHOUT CONTRAST CT CERVICAL SPINE WITHOUT CONTRAST TECHNIQUE: Multidetector CT imaging of the head, cervical spine, and maxillofacial  structures were performed using the standard protocol without intravenous contrast. Multiplanar CT image reconstructions of the cervical spine and maxillofacial structures were also generated. RADIATION DOSE REDUCTION: This exam was performed according to the departmental dose-optimization program which includes automated exposure control, adjustment of the mA and/or kV according to patient size and/or use of iterative reconstruction technique. COMPARISON:  01/19/2024 FINDINGS: CT HEAD FINDINGS Brain: Ventricles, cisterns and other CSF spaces are normal. There is chronic ischemic microvascular disease. Stable mild encephalomalacia over the right frontal lobe. 7 mm focus of intraparenchymal acute hemorrhage over the left frontal white matter. Very minimal acute subarachnoid hemorrhage over the adjacent right frontal lobe. No epidural subdural hemorrhage. No significant mass effect or midline shift. Traumatic Brain Injury Risk Stratification Skull Fracture: No - Low/mBIG 1 Subdural Hematoma (SDH): No - Low Subarachnoid Hemorrhage [facility]): Small single focus of acute  subarachnoid hemorrhage over the right frontal region adjacent the midline. Epidural Hematoma (EDH): No - Low/mBIG 1 Cerebral contusion, intra-axial, intraparenchymal Hemorrhage (IPH): Single 7 mm focus of acute intraparenchymal hemorrhage over the left frontal white matter. Intraventricular Hemorrhage (IVH): No - Low/mBIG 1 Midline Shift > 1mm or Edema/effacement of sulci/vents: No - Low/mBIG 1 ---------------------------------------------------- Vascular: No hyperdense vessel or unexpected calcification. Skull: Normal. Negative for fracture or focal lesion. Other: Minimally displaced nasal bone fracture as well as fracture of the junction of the left nasal bone to the frontal process of the maxilla. Opacification over the left maxillary and ethmoid air cells. Minimal opacification over the left sphenoid sinus. Left frontal scalp contusion. CT MAXILLOFACIAL FINDINGS Osseous: Examination demonstrates a depressed comminuted left orbital floor fracture with partial entrapment of inferior rectus. There is also fracture of the medial wall the left orbit without entrapment of intraorbital contents. There is a minimally displaced nasal bone fracture. Subtle fracture of the junction of the left nasal bone to the frontal process of the left axilla. Orbits: Fractures of the medial wall and floor of the left orbit as described above. Left periorbital soft tissue swelling. Globes are normal and symmetric. Retrobulbar spaces are otherwise unremarkable. Sinuses: Hypoplastic frontal sinuses. Hemorrhagic debris over the and left ethmoid and maxillary sinuses. Mastoid air cells are clear. Soft tissues: Soft tissue swelling adjacent the left mandible as well as over the left nasal bridge and left periorbital region. CT CERVICAL SPINE FINDINGS Alignment: Normal. Skull base and vertebrae: Vertebral body heights are maintained. There is mild spondylosis throughout the cervical spine to include uncovertebral joint spurring and facet  arthropathy. No acute fracture. Mild bilateral neural foraminal narrowing at the C5-6 level and C6-7 levels. Soft tissues and spinal canal: No prevertebral fluid or swelling. No visible canal hematoma. Disc levels: Mild disc space narrowing at the C5-6 and C6-7 levels. Central disc protrusion at the C3-4 level with more pronounced central disc protrusion at the C4-5 level. Upper chest: No acute findings. Other: Schmorl's node over the superior endplate of T2 unchanged. IMPRESSION: 1. 7 mm focus of acute intraparenchymal hemorrhage over the left frontal white matter. Very minimal acute subarachnoid hemorrhage over the adjacent right frontal lobe. Findings compatible with minor TBI BIG-2 categorization. 2. Chronic ischemic microvascular disease with stable mild encephalomalacia over the right frontal lobe. 3. Depressed comminuted left orbital floor fracture with partial entrapment of inferior rectus muscle. Fracture of the medial wall the left orbit without entrapment of intraorbital contents. 4. Minimally displaced nasal bone fracture as well as fracture of the junction of the left nasal bone to the frontal process of the  left maxilla. 5. No acute cervical spine injury. 6. Mild spondylosis throughout the cervical spine with mild disc disease at the C5-6 and C6-7 levels. Central disc protrusion at the C3-4 level with more pronounced central disc protrusion at the C4-5 level as these findings would be better evaluated with MRI. Critical Value/emergent results were called by telephone at the time of interpretation on 06/28/2024 at 1:28 pm to provider ANDREW TEE , who verbally acknowledged these results. Electronically Signed   By: Toribio Agreste M.D.   On: 06/28/2024 13:29   CT MAXILLOFACIAL WO CONTRAST Result Date: 06/28/2024 CLINICAL DATA:  Syncopal event with head injury and loss of consciousness. Patient on Coumadin . Facial trauma. Nausea and vomiting. EXAM: CT HEAD WITHOUT CONTRAST CT MAXILLOFACIAL WITHOUT  CONTRAST CT CERVICAL SPINE WITHOUT CONTRAST TECHNIQUE: Multidetector CT imaging of the head, cervical spine, and maxillofacial structures were performed using the standard protocol without intravenous contrast. Multiplanar CT image reconstructions of the cervical spine and maxillofacial structures were also generated. RADIATION DOSE REDUCTION: This exam was performed according to the departmental dose-optimization program which includes automated exposure control, adjustment of the mA and/or kV according to patient size and/or use of iterative reconstruction technique. COMPARISON:  01/19/2024 FINDINGS: CT HEAD FINDINGS Brain: Ventricles, cisterns and other CSF spaces are normal. There is chronic ischemic microvascular disease. Stable mild encephalomalacia over the right frontal lobe. 7 mm focus of intraparenchymal acute hemorrhage over the left frontal white matter. Very minimal acute subarachnoid hemorrhage over the adjacent right frontal lobe. No epidural subdural hemorrhage. No significant mass effect or midline shift. Traumatic Brain Injury Risk Stratification Skull Fracture: No - Low/mBIG 1 Subdural Hematoma (SDH): No - Low Subarachnoid Hemorrhage [facility]): Small single focus of acute subarachnoid hemorrhage over the right frontal region adjacent the midline. Epidural Hematoma (EDH): No - Low/mBIG 1 Cerebral contusion, intra-axial, intraparenchymal Hemorrhage (IPH): Single 7 mm focus of acute intraparenchymal hemorrhage over the left frontal white matter. Intraventricular Hemorrhage (IVH): No - Low/mBIG 1 Midline Shift > 1mm or Edema/effacement of sulci/vents: No - Low/mBIG 1 ---------------------------------------------------- Vascular: No hyperdense vessel or unexpected calcification. Skull: Normal. Negative for fracture or focal lesion. Other: Minimally displaced nasal bone fracture as well as fracture of the junction of the left nasal bone to the frontal process of the maxilla. Opacification over the left  maxillary and ethmoid air cells. Minimal opacification over the left sphenoid sinus. Left frontal scalp contusion. CT MAXILLOFACIAL FINDINGS Osseous: Examination demonstrates a depressed comminuted left orbital floor fracture with partial entrapment of inferior rectus. There is also fracture of the medial wall the left orbit without entrapment of intraorbital contents. There is a minimally displaced nasal bone fracture. Subtle fracture of the junction of the left nasal bone to the frontal process of the left axilla. Orbits: Fractures of the medial wall and floor of the left orbit as described above. Left periorbital soft tissue swelling. Globes are normal and symmetric. Retrobulbar spaces are otherwise unremarkable. Sinuses: Hypoplastic frontal sinuses. Hemorrhagic debris over the and left ethmoid and maxillary sinuses. Mastoid air cells are clear. Soft tissues: Soft tissue swelling adjacent the left mandible as well as over the left nasal bridge and left periorbital region. CT CERVICAL SPINE FINDINGS Alignment: Normal. Skull base and vertebrae: Vertebral body heights are maintained. There is mild spondylosis throughout the cervical spine to include uncovertebral joint spurring and facet arthropathy. No acute fracture. Mild bilateral neural foraminal narrowing at the C5-6 level and C6-7 levels. Soft tissues and spinal canal: No prevertebral fluid or swelling.  No visible canal hematoma. Disc levels: Mild disc space narrowing at the C5-6 and C6-7 levels. Central disc protrusion at the C3-4 level with more pronounced central disc protrusion at the C4-5 level. Upper chest: No acute findings. Other: Schmorl's node over the superior endplate of T2 unchanged. IMPRESSION: 1. 7 mm focus of acute intraparenchymal hemorrhage over the left frontal white matter. Very minimal acute subarachnoid hemorrhage over the adjacent right frontal lobe. Findings compatible with minor TBI BIG-2 categorization. 2. Chronic ischemic  microvascular disease with stable mild encephalomalacia over the right frontal lobe. 3. Depressed comminuted left orbital floor fracture with partial entrapment of inferior rectus muscle. Fracture of the medial wall the left orbit without entrapment of intraorbital contents. 4. Minimally displaced nasal bone fracture as well as fracture of the junction of the left nasal bone to the frontal process of the left maxilla. 5. No acute cervical spine injury. 6. Mild spondylosis throughout the cervical spine with mild disc disease at the C5-6 and C6-7 levels. Central disc protrusion at the C3-4 level with more pronounced central disc protrusion at the C4-5 level as these findings would be better evaluated with MRI. Critical Value/emergent results were called by telephone at the time of interpretation on 06/28/2024 at 1:28 pm to provider ANDREW TEE , who verbally acknowledged these results. Electronically Signed   By: Toribio Agreste M.D.   On: 06/28/2024 13:29   CT CERVICAL SPINE WO CONTRAST Result Date: 06/28/2024 CLINICAL DATA:  Syncopal event with head injury and loss of consciousness. Patient on Coumadin . Facial trauma. Nausea and vomiting. EXAM: CT HEAD WITHOUT CONTRAST CT MAXILLOFACIAL WITHOUT CONTRAST CT CERVICAL SPINE WITHOUT CONTRAST TECHNIQUE: Multidetector CT imaging of the head, cervical spine, and maxillofacial structures were performed using the standard protocol without intravenous contrast. Multiplanar CT image reconstructions of the cervical spine and maxillofacial structures were also generated. RADIATION DOSE REDUCTION: This exam was performed according to the departmental dose-optimization program which includes automated exposure control, adjustment of the mA and/or kV according to patient size and/or use of iterative reconstruction technique. COMPARISON:  01/19/2024 FINDINGS: CT HEAD FINDINGS Brain: Ventricles, cisterns and other CSF spaces are normal. There is chronic ischemic microvascular disease.  Stable mild encephalomalacia over the right frontal lobe. 7 mm focus of intraparenchymal acute hemorrhage over the left frontal white matter. Very minimal acute subarachnoid hemorrhage over the adjacent right frontal lobe. No epidural subdural hemorrhage. No significant mass effect or midline shift. Traumatic Brain Injury Risk Stratification Skull Fracture: No - Low/mBIG 1 Subdural Hematoma (SDH): No - Low Subarachnoid Hemorrhage [facility]): Small single focus of acute subarachnoid hemorrhage over the right frontal region adjacent the midline. Epidural Hematoma (EDH): No - Low/mBIG 1 Cerebral contusion, intra-axial, intraparenchymal Hemorrhage (IPH): Single 7 mm focus of acute intraparenchymal hemorrhage over the left frontal white matter. Intraventricular Hemorrhage (IVH): No - Low/mBIG 1 Midline Shift > 1mm or Edema/effacement of sulci/vents: No - Low/mBIG 1 ---------------------------------------------------- Vascular: No hyperdense vessel or unexpected calcification. Skull: Normal. Negative for fracture or focal lesion. Other: Minimally displaced nasal bone fracture as well as fracture of the junction of the left nasal bone to the frontal process of the maxilla. Opacification over the left maxillary and ethmoid air cells. Minimal opacification over the left sphenoid sinus. Left frontal scalp contusion. CT MAXILLOFACIAL FINDINGS Osseous: Examination demonstrates a depressed comminuted left orbital floor fracture with partial entrapment of inferior rectus. There is also fracture of the medial wall the left orbit without entrapment of intraorbital contents. There is a minimally displaced  nasal bone fracture. Subtle fracture of the junction of the left nasal bone to the frontal process of the left axilla. Orbits: Fractures of the medial wall and floor of the left orbit as described above. Left periorbital soft tissue swelling. Globes are normal and symmetric. Retrobulbar spaces are otherwise unremarkable. Sinuses:  Hypoplastic frontal sinuses. Hemorrhagic debris over the and left ethmoid and maxillary sinuses. Mastoid air cells are clear. Soft tissues: Soft tissue swelling adjacent the left mandible as well as over the left nasal bridge and left periorbital region. CT CERVICAL SPINE FINDINGS Alignment: Normal. Skull base and vertebrae: Vertebral body heights are maintained. There is mild spondylosis throughout the cervical spine to include uncovertebral joint spurring and facet arthropathy. No acute fracture. Mild bilateral neural foraminal narrowing at the C5-6 level and C6-7 levels. Soft tissues and spinal canal: No prevertebral fluid or swelling. No visible canal hematoma. Disc levels: Mild disc space narrowing at the C5-6 and C6-7 levels. Central disc protrusion at the C3-4 level with more pronounced central disc protrusion at the C4-5 level. Upper chest: No acute findings. Other: Schmorl's node over the superior endplate of T2 unchanged. IMPRESSION: 1. 7 mm focus of acute intraparenchymal hemorrhage over the left frontal white matter. Very minimal acute subarachnoid hemorrhage over the adjacent right frontal lobe. Findings compatible with minor TBI BIG-2 categorization. 2. Chronic ischemic microvascular disease with stable mild encephalomalacia over the right frontal lobe. 3. Depressed comminuted left orbital floor fracture with partial entrapment of inferior rectus muscle. Fracture of the medial wall the left orbit without entrapment of intraorbital contents. 4. Minimally displaced nasal bone fracture as well as fracture of the junction of the left nasal bone to the frontal process of the left maxilla. 5. No acute cervical spine injury. 6. Mild spondylosis throughout the cervical spine with mild disc disease at the C5-6 and C6-7 levels. Central disc protrusion at the C3-4 level with more pronounced central disc protrusion at the C4-5 level as these findings would be better evaluated with MRI. Critical Value/emergent  results were called by telephone at the time of interpretation on 06/28/2024 at 1:28 pm to provider ANDREW TEE , who verbally acknowledged these results. Electronically Signed   By: Toribio Agreste M.D.   On: 06/28/2024 13:29   DG Chest Port 1 View Result Date: 06/28/2024 EXAM: 1 VIEW(S) XRAY OF THE CHEST 06/28/2024 12:31:00 PM COMPARISON: 05/30/2024 CLINICAL HISTORY: trauma; fall on thinners FINDINGS: LUNGS AND PLEURA: Diffuse interstitial prominence. Bibasilar patchy opacities. No pulmonary edema. No pleural effusion. No pneumothorax. HEART AND MEDIASTINUM: Similar enlarged cardiomediastinal silhouette status post aortic and mitral valve prostheses. BONES AND SOFT TISSUES: Right axillary surgical clips. Nondisplaced median sternotomy wires. No acute osseous abnormality. IMPRESSION: 1. Diffuse interstitial prominence with bibasilar patchy opacities, which may reflect edema or infection; correlate clinically. 2. Stable enlarged cardiomediastinal silhouette with aortic and mitral valve prostheses. Electronically signed by: Waddell Calk MD 06/28/2024 01:06 PM EDT RP Workstation: [id number]    Family Communication: Discussed with patient, spouse at bedside, understand and agree. All questions answered.  Disposition: Status is: Observation The patient will require care spanning > 2 midnights and should be moved to inpatient because: Syncope workup, PT OT evaluation, pain control  Planned Discharge Destination: Home with Home Health     Time spent: 51 minutes  Author: Concepcion Riser, MD 06/29/2024 2:57 PM Secure chat 7am to 7pm For on call review [email or web address].

## 2024-06-29 NOTE — Consult Note (Signed)
 Reason for Consult: Closed head injury small intraparenchymal hemorrhagic contusion Referring Physician: Emergency department [facility] hospitalist  [name] is an 78 y.o. female.  HPI: 78 year old female had a syncopal episode at the nursing home brought to the emergency room found to have a small intraparenchymal hemorrhage.  Patient was admitted stabilized and workup has begun for origins and source of syncope.  Patient has significant past medical history of heart valve atrial fibrillation previous strokes currently on Coumadin  this was held upon presentation to the ER.  Past Medical History:  Diagnosis Date   Anemia    Asthma 2010   Atrial fibrillation (HCC)    Atrial flutter (HCC)    Breast carcinoma (HCC) 1995   1995   Cardioembolic stroke (HCC) 01/2012   Right frontal in 01/2012; normal carotid ultrasound; possible LAA thrombus by TEE; virtual complete neurologic recovery   Carotid artery stenosis 1961   Chronic kidney disease, stage 2, mildly decreased GFR    GFR of approximately 60   Diabetes mellitus without complication (HCC) 5+ years ago   Controlled most of time   Diverticulosis of colon (without mention of hemorrhage) 2012   Dr. Golda   Fasting hyperglycemia    120 fasting   Gastroesophageal reflux disease    Gastroparesis    Heart failure with improved ejection fraction (HFimpEF) (HCC) 5+ years   Heart murmur 1961   Hemorrhoids    History of aortic valve replacement with bioprosthetic valve    History of mitral valve replacement with bioprosthetic valve    Hyperlipidemia    Hypertension    pt denies 05/30/13     Dr Alvan chester   Hyponatremia    Hypothyroidism    Rheumatic heart disease    Shortness of breath    Syncope and collapse February, 2024    Past Surgical History:  Procedure Laterality Date   AORTIC VALVE REPLACEMENT N/A 07/18/2013   Procedure: AORTIC VALVE REPLACEMENT (AVR);  Surgeon: Dorise MARLA Fellers, MD;  Location: [facility] OR;  Service: Open  Heart Surgery;  Laterality: N/A;   BACK SURGERY     BREAST LUMPECTOMY Right 10/03/1993   CARDIAC CATHETERIZATION  5+ years   CARDIOVERSION N/A 07/26/2013   Procedure: CARDIOVERSION;  Surgeon: Vina LULLA Gull, MD;  Location: [facility] ENDOSCOPY;  Service: Cardiovascular;  Laterality: N/A;   CARDIOVERSION N/A 01/04/2023   Procedure: CARDIOVERSION;  Surgeon: Hobart Powell BRAVO, MD;  Location: [facility] ENDOSCOPY;  Service: Cardiovascular;  Laterality: N/A;   CARDIOVERSION N/A 02/23/2024   Procedure: CARDIOVERSION;  Surgeon: [name], MD;  Location: [facility] ORS;  Service: Endoscopy;  Laterality: N/A;   COLONOSCOPY  10/03/2010   Negative screening procedure   DILATION AND CURETTAGE OF UTERUS     ESOPHAGEAL MANOMETRY N/A 06/17/2013   Procedure: ESOPHAGEAL MANOMETRY (EM);  Surgeon: Alm JONELLE Gander, MD;  Location: [facility] ENDOSCOPY;  Service: Endoscopy;  Laterality: N/A;   INTRAOPERATIVE TRANSESOPHAGEAL ECHOCARDIOGRAM N/A 07/18/2013   Procedure: INTRAOPERATIVE TRANSESOPHAGEAL ECHOCARDIOGRAM;  Surgeon: Dorise MARLA Fellers, MD;  Location: [facility] OR;  Service: Open Heart Surgery;  Laterality: N/A;   LEFT AND RIGHT HEART CATHETERIZATION WITH CORONARY ANGIOGRAM N/A 07/10/2013   Procedure: LEFT AND RIGHT HEART CATHETERIZATION WITH CORONARY ANGIOGRAM;  Surgeon: Lonni JONETTA Cash, MD;  Location: [facility] CATH LAB;  Service: Cardiovascular;  Laterality: N/A;   LUMBAR LAMINECTOMY/DECOMPRESSION MICRODISCECTOMY Right 05/13/2014   Procedure: LUMBAR LAMINECTOMY/DECOMPRESSION MICRODISCECTOMY 1 LEVEL  lumbar four/five;  Surgeon: Darina MALVA Boehringer, MD;  Location: [facility] NEURO ORS;  Service: Neurosurgery;  Laterality: Right;   MITRAL VALVE REPLACEMENT N/A 07/18/2013   Procedure: MITRAL VALVE (MV) REPLACEMENT;  Surgeon: Dorise MARLA Fellers, MD;  Location: [facility] OR;  Service: Open Heart Surgery;  Laterality: N/A;   MITRAL VALVE SURGERY  10/03/1960   [facility], closed mitral valvulotomy by finger fracture   TEE WITHOUT CARDIOVERSION  01/24/2012   Procedure:  TRANSESOPHAGEAL ECHOCARDIOGRAM (TEE);  Surgeon: Redell GORMAN Shallow, MD;  Location: [facility] ENDOSCOPY;  Service: Cardiovascular;  Laterality: N/A;   TEE WITHOUT CARDIOVERSION N/A 07/11/2013   Procedure: TRANSESOPHAGEAL ECHOCARDIOGRAM (TEE);  Surgeon: Aleene JINNY Passe, MD;  Location: [facility] ENDOSCOPY;  Service: Cardiovascular;  Laterality: N/A;   TEE WITHOUT CARDIOVERSION N/A 07/26/2013   Procedure: TRANSESOPHAGEAL ECHOCARDIOGRAM (TEE);  Surgeon: Vina LULLA Gull, MD;  Location: [facility] ENDOSCOPY;  Service: Cardiovascular;  Laterality: N/A;   TUBAL LIGATION  10/04/1971    Family History  Problem Relation Age of Onset   Heart disease Brother 60       MI   Rheum arthritis Maternal Grandmother    Asthma Maternal Grandfather    Hypothyroidism Mother    Diabetes Sister    Cirrhosis Father     Social History:  reports that she has never smoked. She has never used smokeless tobacco. She reports that she does not drink alcohol and does not use drugs.  Allergies: No Known Allergies  Medications: I have reviewed the patient's current medications.  Results for orders placed or performed during the hospital encounter of 06/28/24 (from the past 48 hours)  Comprehensive metabolic panel     Status: Abnormal   Collection Time: 06/28/24 12:19 PM  Result Value Ref Range   Sodium 138 135 - 145 mmol/L   Potassium 3.9 3.5 - 5.1 mmol/L    Comment: HEMOLYSIS AT THIS LEVEL MAY AFFECT RESULT   Chloride 99 98 - 111 mmol/L   CO2 25 22 - 32 mmol/L   Glucose, Bld 121 (H) 70 - 99 mg/dL    Comment: Glucose reference range applies only to samples taken after fasting for at least 8 hours.   BUN 16 8 - 23 mg/dL   Creatinine, Ser 8.24 (H) 0.44 - 1.00 mg/dL   Calcium  8.8 (L) 8.9 - 10.3 mg/dL   Total Protein 7.2 6.5 - 8.1 g/dL   Albumin  3.8 3.5 - 5.0 g/dL   AST 55 (H) 15 - 41 U/L    Comment: HEMOLYSIS AT THIS LEVEL MAY AFFECT RESULT   ALT 26 0 - 44 U/L    Comment: HEMOLYSIS AT THIS LEVEL MAY AFFECT RESULT   Alkaline Phosphatase 62  38 - 126 U/L   Total Bilirubin 1.4 (H) 0.0 - 1.2 mg/dL    Comment: HEMOLYSIS AT THIS LEVEL MAY AFFECT RESULT   GFR, Estimated 29 (L) >60 mL/min    Comment: (NOTE) Calculated using the CKD-EPI Creatinine Equation (2021)    Anion gap 14 5 - 15    Comment: Performed at [facility], [address], [place], [place] [address]  CBC     Status: Abnormal   Collection Time: 06/28/24 12:19 PM  Result Value Ref Range   WBC 8.0 4.0 - 10.5 K/uL   RBC 4.60 3.87 - 5.11 MIL/uL   Hemoglobin 12.2 12.0 - 15.0 g/dL   HCT 60.0 63.9 - 53.9 %   MCV 86.7 80.0 - 100.0 fL   MCH 26.5 26.0 - 34.0 pg   MCHC 30.6 30.0 - 36.0 g/dL   RDW 83.6 (H) 88.4 - 84.4 %  Platelets 191 150 - 400 K/uL   nRBC 0.0 0.0 - 0.2 %    Comment: Performed at [facility], [address] [place], [place] [address]  Ethanol     Status: None   Collection Time: 06/28/24 12:19 PM  Result Value Ref Range   Alcohol, Ethyl (B) <15 <15 mg/dL    Comment: (NOTE) For medical purposes only. Performed at [facility], [address], [place], [place] [address]   Urinalysis, Routine w reflex microscopic -Urine, Clean Catch     Status: Abnormal   Collection Time: 06/28/24 12:19 PM  Result Value Ref Range   Color, Urine STRAW (A) YELLOW   APPearance CLEAR CLEAR   Specific Gravity, Urine 1.002 (L) 1.005 - 1.030   pH 7.0 5.0 - 8.0   Glucose, UA >=500 (A) NEGATIVE mg/dL   Hgb urine dipstick NEGATIVE NEGATIVE   Bilirubin Urine NEGATIVE NEGATIVE   Ketones, ur NEGATIVE NEGATIVE mg/dL   Protein, ur NEGATIVE NEGATIVE mg/dL   Nitrite NEGATIVE NEGATIVE   Leukocytes,Ua NEGATIVE NEGATIVE   RBC / HPF 0-5 0 - 5 RBC/hpf   WBC, UA 0-5 0 - 5 WBC/hpf   Bacteria, UA RARE (A) NONE SEEN   Squamous Epithelial / HPF 0-5 0 - 5 /HPF   Mucus PRESENT    Hyaline Casts, UA PRESENT     Comment: Performed at [facility], [address] [place], [place] [address]  Protime-INR     Status: Abnormal   Collection Time: 06/28/24 12:19 PM   Result Value Ref Range   Prothrombin Time 36.1 (H) 11.4 - 15.2 seconds   INR 3.4 (H) 0.8 - 1.2    Comment: (NOTE) INR goal varies based on device and disease states. Performed at [facility], [address], [place], [place] [address]   Sample to Blood Bank     Status: None   Collection Time: 06/28/24 12:19 PM  Result Value Ref Range   Blood Bank Specimen SAMPLE AVAILABLE FOR TESTING    Sample Expiration      07/01/2024,2359 Performed at [facility], [address] [place], [place] [address]   Troponin I (High Sensitivity)     Status: None   Collection Time: 06/28/24 12:19 PM  Result Value Ref Range   Troponin I (High Sensitivity) 16 <18 ng/L    Comment: (NOTE) Elevated high sensitivity troponin I (hsTnI) values and significant  changes across serial measurements may suggest ACS but many other  chronic and acute conditions are known to elevate hsTnI results.  Refer to the Links section for chest pain algorithms and additional  guidance. Performed at [facility], [address] [place], [place] [address]   I-Stat Lactic Acid, ED     Status: None   Collection Time: 06/28/24 12:21 PM  Result Value Ref Range   Lactic Acid, Venous 1.9 0.5 - 1.9 mmol/L  I-Stat Chem 8, ED     Status: Abnormal   Collection Time: 06/28/24 12:22 PM  Result Value Ref Range   Sodium 137 135 - 145 mmol/L   Potassium 3.9 3.5 - 5.1 mmol/L   Chloride 99 98 - 111 mmol/L   BUN 22 8 - 23 mg/dL   Creatinine, Ser 8.09 (H) 0.44 - 1.00 mg/dL   Glucose, Bld 877 (H) 70 - 99 mg/dL    Comment: Glucose reference range applies only to samples taken after fasting for at least 8 hours.   Calcium ,  Ion 1.01 (L) 1.15 - 1.40 mmol/L   TCO2 29 22 - 32 mmol/L   Hemoglobin 13.9 12.0 - 15.0 g/dL   HCT 58.9 63.9 - 53.9 %  Troponin I (High Sensitivity)     Status: None   Collection Time: 06/28/24  2:20 PM  Result Value Ref Range   Troponin I (High Sensitivity) 14 <18 ng/L    Comment:  (NOTE) Elevated high sensitivity troponin I (hsTnI) values and significant  changes across serial measurements may suggest ACS but many other  chronic and acute conditions are known to elevate hsTnI results.  Refer to the Links section for chest pain algorithms and additional  guidance. Performed at [facility], [address] [place], [place] [address]   Glucose, capillary     Status: Abnormal   Collection Time: 06/28/24  8:08 PM  Result Value Ref Range   Glucose-Capillary 150 (H) 70 - 99 mg/dL    Comment: Glucose reference range applies only to samples taken after fasting for at least 8 hours.  CK     Status: None   Collection Time: 06/28/24  8:09 PM  Result Value Ref Range   Total CK 97 38 - 234 U/L    Comment: Performed at [facility], [address] [place], [place] [address]  Glucose, capillary     Status: Abnormal   Collection Time: 06/28/24 11:31 PM  Result Value Ref Range   Glucose-Capillary 122 (H) 70 - 99 mg/dL    Comment: Glucose reference range applies only to samples taken after fasting for at least 8 hours.  Glucose, capillary     Status: Abnormal   Collection Time: 06/29/24  3:08 AM  Result Value Ref Range   Glucose-Capillary 120 (H) 70 - 99 mg/dL    Comment: Glucose reference range applies only to samples taken after fasting for at least 8 hours.  CBC     Status: Abnormal   Collection Time: 06/29/24  6:17 AM  Result Value Ref Range   WBC 6.5 4.0 - 10.5 K/uL   RBC 4.11 3.87 - 5.11 MIL/uL   Hemoglobin 10.9 (L) 12.0 - 15.0 g/dL   HCT 64.2 (L) 63.9 - 53.9 %   MCV 86.9 80.0 - 100.0 fL   MCH 26.5 26.0 - 34.0 pg   MCHC 30.5 30.0 - 36.0 g/dL   RDW 83.8 (H) 88.4 - 84.4 %   Platelets 164 150 - 400 K/uL   nRBC 0.0 0.0 - 0.2 %    Comment: Performed at [facility], [address] [place], [place] [address]  Glucose, capillary     Status: None   Collection Time: 06/29/24  7:37 AM  Result Value Ref Range   Glucose-Capillary 97 70 - 99 mg/dL     Comment: Glucose reference range applies only to samples taken after fasting for at least 8 hours.    MR Cervical Spine Wo Contrast Result Date: 06/28/2024 CLINICAL DATA:  Initial evaluation for acute ataxia. EXAM: MRI CERVICAL SPINE WITHOUT CONTRAST TECHNIQUE: Multiplanar, multisequence MR imaging of the cervical spine was performed. No intravenous contrast was administered. COMPARISON:  CT from earlier the same day. FINDINGS: Alignment: Straightening with slight reversal of the normal cervical lordosis. No significant listhesis. Vertebrae: Mild chronic height loss with endplate Schmorl's node deformity noted at the superior endplate of T2. Vertebral body height otherwise maintained without acute or recent fracture. Bone marrow signal intensity within normal limits. No worrisome osseous lesions. No abnormal marrow  edema. Cord: Normal signal and morphology. Posterior Fossa, vertebral arteries, paraspinal tissues: Patchy signal abnormality within the pons, most characteristic of chronic micro ischemic disease. Few small remote left cerebellar infarcts. Craniocervical junction normal. Paraspinous soft tissues within normal limits. Normal flow voids seen within the vertebral arteries bilaterally. Disc levels: C2-C3: Negative interspace. Mild to moderate left greater than right facet hypertrophy. No stenosis. C3-C4: Central disc protrusion indents the ventral thecal sac. Moderate right with mild left facet hypertrophy. No spinal stenosis. Foramina remain patent. C4-C5: Left paracentral disc protrusion indents the ventral thecal sac. Minimal cord flattening without cord signal changes. Mild spinal stenosis. Foramina remain patent. C5-C6: Degenerative disc space narrowing with diffuse disc osteophyte complex. Flattening and partial effacement of the ventral thecal sac, asymmetric to the right. Mild spinal stenosis. Severe left with moderate right C6 foraminal narrowing. C6-C7: Degenerative intervertebral disc space  narrowing with diffuse disc osteophyte complex. Flattening of the ventral thecal sac with no more than mild spinal stenosis. Moderate left with mild to moderate right C7 foraminal narrowing. C7-T1: Negative interspace. Mild left greater than right facet hypertrophy. No stenosis. IMPRESSION: 1. Normal MRI appearance of the cervical spinal cord. 2. Multilevel cervical spondylosis with resultant mild diffuse spinal stenosis at C4-5 through C6-7. 3. Multifactorial degenerative changes with resultant multilevel foraminal narrowing as above. Notable findings include severe left with moderate right C6 foraminal stenosis, with moderate left and mild to moderate right C7 foraminal narrowing. Electronically Signed   By: Morene Hoard M.D.   On: 06/28/2024 21:23   MR BRAIN WO CONTRAST Result Date: 06/28/2024 CLINICAL DATA:  Initial evaluation for acute neuro deficit, stroke suspected. EXAM: MRI HEAD WITHOUT CONTRAST TECHNIQUE: Multiplanar, multiecho pulse sequences of the brain and surrounding structures were obtained without intravenous contrast. COMPARISON:  CT from earlier the same day. FINDINGS: Brain: Examination degraded by motion. Generalized age-related cerebral atrophy. Patchy T2/FLAIR hyperintensity about the periventricular and deep white matter as well as the pons, consistent with chronic small vessel ischemic disease, moderate in nature. Encephalomalacia and gliosis involving the subcortical right frontal lobe, consistent with a chronic right ACA distribution infarct. Multiple small remote bilateral cerebellar infarcts, greater on the left. Small remote lacunar infarct noted within the left thalamus. No evidence for acute or subacute ischemia. Gray-white matter differentiation maintained. 1.2 cm acute intraparenchymal hemorrhage present at the subcortical left frontal lobe, corresponding with abnormality on prior CT, likely a small hemorrhagic contusion. Trace acute intraventricular hemorrhage seen  layering within the occipital horns of both lateral ventricles. No other acute intracranial hemorrhage. Chronic hemosiderin staining noted at the parafalcine right frontal lobe related to the adjacent right ACA infarct. No other significant chronic intracranial blood products. No mass lesion or midline shift. Ventricular prominence related global parenchymal volume loss of hydrocephalus. No extra-axial fluid collection. Pituitary gland within normal limits. Vascular: Major intracranial vascular flow voids are grossly maintained at the skull base. Skull and upper cervical spine: Cranial junction within normal limits. Bone marrow signal intensity normal. Soft tissue contusion present at the left forehead/periorbital region. Sinuses/Orbits: Acute left orbital floor fracture with scattered blood products within the left maxillary sinus, better evaluated on prior maxillofacial CT. No mastoid effusion. Other: None. IMPRESSION: 1. 1.2 cm acute intraparenchymal hemorrhage at the subcortical left frontal lobe, corresponding with abnormality on prior CT, likely a small hemorrhagic contusion. 2. Trace acute intraventricular hemorrhage layering within the occipital horns of both lateral ventricles. No hydrocephalus. 3. No other acute intracranial abnormality. 4. Acute left orbital floor fracture, better  evaluated on prior maxillofacial CT. Prominent overlying left periorbital soft tissue contusion. 5. Underlying age-related cerebral atrophy with moderate chronic small vessel ischemic disease, with multiple remote ischemic infarcts as above. Electronically Signed   By: Morene Hoard M.D.   On: 06/28/2024 21:03   CT HEAD WO CONTRAST Result Date: 06/28/2024 CLINICAL DATA:  Syncopal event with head injury and loss of consciousness. Patient on Coumadin . Facial trauma. Nausea and vomiting. EXAM: CT HEAD WITHOUT CONTRAST CT MAXILLOFACIAL WITHOUT CONTRAST CT CERVICAL SPINE WITHOUT CONTRAST TECHNIQUE: Multidetector CT imaging  of the head, cervical spine, and maxillofacial structures were performed using the standard protocol without intravenous contrast. Multiplanar CT image reconstructions of the cervical spine and maxillofacial structures were also generated. RADIATION DOSE REDUCTION: This exam was performed according to the departmental dose-optimization program which includes automated exposure control, adjustment of the mA and/or kV according to patient size and/or use of iterative reconstruction technique. COMPARISON:  01/19/2024 FINDINGS: CT HEAD FINDINGS Brain: Ventricles, cisterns and other CSF spaces are normal. There is chronic ischemic microvascular disease. Stable mild encephalomalacia over the right frontal lobe. 7 mm focus of intraparenchymal acute hemorrhage over the left frontal white matter. Very minimal acute subarachnoid hemorrhage over the adjacent right frontal lobe. No epidural subdural hemorrhage. No significant mass effect or midline shift. Traumatic Brain Injury Risk Stratification Skull Fracture: No - Low/mBIG 1 Subdural Hematoma (SDH): No - Low Subarachnoid Hemorrhage [facility]): Small single focus of acute subarachnoid hemorrhage over the right frontal region adjacent the midline. Epidural Hematoma (EDH): No - Low/mBIG 1 Cerebral contusion, intra-axial, intraparenchymal Hemorrhage (IPH): Single 7 mm focus of acute intraparenchymal hemorrhage over the left frontal white matter. Intraventricular Hemorrhage (IVH): No - Low/mBIG 1 Midline Shift > 1mm or Edema/effacement of sulci/vents: No - Low/mBIG 1 ---------------------------------------------------- Vascular: No hyperdense vessel or unexpected calcification. Skull: Normal. Negative for fracture or focal lesion. Other: Minimally displaced nasal bone fracture as well as fracture of the junction of the left nasal bone to the frontal process of the maxilla. Opacification over the left maxillary and ethmoid air cells. Minimal opacification over the left sphenoid sinus.  Left frontal scalp contusion. CT MAXILLOFACIAL FINDINGS Osseous: Examination demonstrates a depressed comminuted left orbital floor fracture with partial entrapment of inferior rectus. There is also fracture of the medial wall the left orbit without entrapment of intraorbital contents. There is a minimally displaced nasal bone fracture. Subtle fracture of the junction of the left nasal bone to the frontal process of the left axilla. Orbits: Fractures of the medial wall and floor of the left orbit as described above. Left periorbital soft tissue swelling. Globes are normal and symmetric. Retrobulbar spaces are otherwise unremarkable. Sinuses: Hypoplastic frontal sinuses. Hemorrhagic debris over the and left ethmoid and maxillary sinuses. Mastoid air cells are clear. Soft tissues: Soft tissue swelling adjacent the left mandible as well as over the left nasal bridge and left periorbital region. CT CERVICAL SPINE FINDINGS Alignment: Normal. Skull base and vertebrae: Vertebral body heights are maintained. There is mild spondylosis throughout the cervical spine to include uncovertebral joint spurring and facet arthropathy. No acute fracture. Mild bilateral neural foraminal narrowing at the C5-6 level and C6-7 levels. Soft tissues and spinal canal: No prevertebral fluid or swelling. No visible canal hematoma. Disc levels: Mild disc space narrowing at the C5-6 and C6-7 levels. Central disc protrusion at the C3-4 level with more pronounced central disc protrusion at the C4-5 level. Upper chest: No acute findings. Other: Schmorl's node over the superior endplate of T2  unchanged. IMPRESSION: 1. 7 mm focus of acute intraparenchymal hemorrhage over the left frontal white matter. Very minimal acute subarachnoid hemorrhage over the adjacent right frontal lobe. Findings compatible with minor TBI BIG-2 categorization. 2. Chronic ischemic microvascular disease with stable mild encephalomalacia over the right frontal lobe. 3.  Depressed comminuted left orbital floor fracture with partial entrapment of inferior rectus muscle. Fracture of the medial wall the left orbit without entrapment of intraorbital contents. 4. Minimally displaced nasal bone fracture as well as fracture of the junction of the left nasal bone to the frontal process of the left maxilla. 5. No acute cervical spine injury. 6. Mild spondylosis throughout the cervical spine with mild disc disease at the C5-6 and C6-7 levels. Central disc protrusion at the C3-4 level with more pronounced central disc protrusion at the C4-5 level as these findings would be better evaluated with MRI. Critical Value/emergent results were called by telephone at the time of interpretation on 06/28/2024 at 1:28 pm to provider ANDREW TEE , who verbally acknowledged these results. Electronically Signed   By: Toribio Agreste M.D.   On: 06/28/2024 13:29   CT MAXILLOFACIAL WO CONTRAST Result Date: 06/28/2024 CLINICAL DATA:  Syncopal event with head injury and loss of consciousness. Patient on Coumadin . Facial trauma. Nausea and vomiting. EXAM: CT HEAD WITHOUT CONTRAST CT MAXILLOFACIAL WITHOUT CONTRAST CT CERVICAL SPINE WITHOUT CONTRAST TECHNIQUE: Multidetector CT imaging of the head, cervical spine, and maxillofacial structures were performed using the standard protocol without intravenous contrast. Multiplanar CT image reconstructions of the cervical spine and maxillofacial structures were also generated. RADIATION DOSE REDUCTION: This exam was performed according to the departmental dose-optimization program which includes automated exposure control, adjustment of the mA and/or kV according to patient size and/or use of iterative reconstruction technique. COMPARISON:  01/19/2024 FINDINGS: CT HEAD FINDINGS Brain: Ventricles, cisterns and other CSF spaces are normal. There is chronic ischemic microvascular disease. Stable mild encephalomalacia over the right frontal lobe. 7 mm focus of intraparenchymal  acute hemorrhage over the left frontal white matter. Very minimal acute subarachnoid hemorrhage over the adjacent right frontal lobe. No epidural subdural hemorrhage. No significant mass effect or midline shift. Traumatic Brain Injury Risk Stratification Skull Fracture: No - Low/mBIG 1 Subdural Hematoma (SDH): No - Low Subarachnoid Hemorrhage [facility]): Small single focus of acute subarachnoid hemorrhage over the right frontal region adjacent the midline. Epidural Hematoma (EDH): No - Low/mBIG 1 Cerebral contusion, intra-axial, intraparenchymal Hemorrhage (IPH): Single 7 mm focus of acute intraparenchymal hemorrhage over the left frontal white matter. Intraventricular Hemorrhage (IVH): No - Low/mBIG 1 Midline Shift > 1mm or Edema/effacement of sulci/vents: No - Low/mBIG 1 ---------------------------------------------------- Vascular: No hyperdense vessel or unexpected calcification. Skull: Normal. Negative for fracture or focal lesion. Other: Minimally displaced nasal bone fracture as well as fracture of the junction of the left nasal bone to the frontal process of the maxilla. Opacification over the left maxillary and ethmoid air cells. Minimal opacification over the left sphenoid sinus. Left frontal scalp contusion. CT MAXILLOFACIAL FINDINGS Osseous: Examination demonstrates a depressed comminuted left orbital floor fracture with partial entrapment of inferior rectus. There is also fracture of the medial wall the left orbit without entrapment of intraorbital contents. There is a minimally displaced nasal bone fracture. Subtle fracture of the junction of the left nasal bone to the frontal process of the left axilla. Orbits: Fractures of the medial wall and floor of the left orbit as described above. Left periorbital soft tissue swelling. Globes are normal and symmetric. Retrobulbar spaces  are otherwise unremarkable. Sinuses: Hypoplastic frontal sinuses. Hemorrhagic debris over the and left ethmoid and maxillary  sinuses. Mastoid air cells are clear. Soft tissues: Soft tissue swelling adjacent the left mandible as well as over the left nasal bridge and left periorbital region. CT CERVICAL SPINE FINDINGS Alignment: Normal. Skull base and vertebrae: Vertebral body heights are maintained. There is mild spondylosis throughout the cervical spine to include uncovertebral joint spurring and facet arthropathy. No acute fracture. Mild bilateral neural foraminal narrowing at the C5-6 level and C6-7 levels. Soft tissues and spinal canal: No prevertebral fluid or swelling. No visible canal hematoma. Disc levels: Mild disc space narrowing at the C5-6 and C6-7 levels. Central disc protrusion at the C3-4 level with more pronounced central disc protrusion at the C4-5 level. Upper chest: No acute findings. Other: Schmorl's node over the superior endplate of T2 unchanged. IMPRESSION: 1. 7 mm focus of acute intraparenchymal hemorrhage over the left frontal white matter. Very minimal acute subarachnoid hemorrhage over the adjacent right frontal lobe. Findings compatible with minor TBI BIG-2 categorization. 2. Chronic ischemic microvascular disease with stable mild encephalomalacia over the right frontal lobe. 3. Depressed comminuted left orbital floor fracture with partial entrapment of inferior rectus muscle. Fracture of the medial wall the left orbit without entrapment of intraorbital contents. 4. Minimally displaced nasal bone fracture as well as fracture of the junction of the left nasal bone to the frontal process of the left maxilla. 5. No acute cervical spine injury. 6. Mild spondylosis throughout the cervical spine with mild disc disease at the C5-6 and C6-7 levels. Central disc protrusion at the C3-4 level with more pronounced central disc protrusion at the C4-5 level as these findings would be better evaluated with MRI. Critical Value/emergent results were called by telephone at the time of interpretation on 06/28/2024 at 1:28 pm to  provider ANDREW TEE , who verbally acknowledged these results. Electronically Signed   By: Toribio Agreste M.D.   On: 06/28/2024 13:29   CT CERVICAL SPINE WO CONTRAST Result Date: 06/28/2024 CLINICAL DATA:  Syncopal event with head injury and loss of consciousness. Patient on Coumadin . Facial trauma. Nausea and vomiting. EXAM: CT HEAD WITHOUT CONTRAST CT MAXILLOFACIAL WITHOUT CONTRAST CT CERVICAL SPINE WITHOUT CONTRAST TECHNIQUE: Multidetector CT imaging of the head, cervical spine, and maxillofacial structures were performed using the standard protocol without intravenous contrast. Multiplanar CT image reconstructions of the cervical spine and maxillofacial structures were also generated. RADIATION DOSE REDUCTION: This exam was performed according to the departmental dose-optimization program which includes automated exposure control, adjustment of the mA and/or kV according to patient size and/or use of iterative reconstruction technique. COMPARISON:  01/19/2024 FINDINGS: CT HEAD FINDINGS Brain: Ventricles, cisterns and other CSF spaces are normal. There is chronic ischemic microvascular disease. Stable mild encephalomalacia over the right frontal lobe. 7 mm focus of intraparenchymal acute hemorrhage over the left frontal white matter. Very minimal acute subarachnoid hemorrhage over the adjacent right frontal lobe. No epidural subdural hemorrhage. No significant mass effect or midline shift. Traumatic Brain Injury Risk Stratification Skull Fracture: No - Low/mBIG 1 Subdural Hematoma (SDH): No - Low Subarachnoid Hemorrhage [facility]): Small single focus of acute subarachnoid hemorrhage over the right frontal region adjacent the midline. Epidural Hematoma (EDH): No - Low/mBIG 1 Cerebral contusion, intra-axial, intraparenchymal Hemorrhage (IPH): Single 7 mm focus of acute intraparenchymal hemorrhage over the left frontal white matter. Intraventricular Hemorrhage (IVH): No - Low/mBIG 1 Midline Shift > 1mm or  Edema/effacement of sulci/vents: No - Low/mBIG 1 ----------------------------------------------------  Vascular: No hyperdense vessel or unexpected calcification. Skull: Normal. Negative for fracture or focal lesion. Other: Minimally displaced nasal bone fracture as well as fracture of the junction of the left nasal bone to the frontal process of the maxilla. Opacification over the left maxillary and ethmoid air cells. Minimal opacification over the left sphenoid sinus. Left frontal scalp contusion. CT MAXILLOFACIAL FINDINGS Osseous: Examination demonstrates a depressed comminuted left orbital floor fracture with partial entrapment of inferior rectus. There is also fracture of the medial wall the left orbit without entrapment of intraorbital contents. There is a minimally displaced nasal bone fracture. Subtle fracture of the junction of the left nasal bone to the frontal process of the left axilla. Orbits: Fractures of the medial wall and floor of the left orbit as described above. Left periorbital soft tissue swelling. Globes are normal and symmetric. Retrobulbar spaces are otherwise unremarkable. Sinuses: Hypoplastic frontal sinuses. Hemorrhagic debris over the and left ethmoid and maxillary sinuses. Mastoid air cells are clear. Soft tissues: Soft tissue swelling adjacent the left mandible as well as over the left nasal bridge and left periorbital region. CT CERVICAL SPINE FINDINGS Alignment: Normal. Skull base and vertebrae: Vertebral body heights are maintained. There is mild spondylosis throughout the cervical spine to include uncovertebral joint spurring and facet arthropathy. No acute fracture. Mild bilateral neural foraminal narrowing at the C5-6 level and C6-7 levels. Soft tissues and spinal canal: No prevertebral fluid or swelling. No visible canal hematoma. Disc levels: Mild disc space narrowing at the C5-6 and C6-7 levels. Central disc protrusion at the C3-4 level with more pronounced central disc  protrusion at the C4-5 level. Upper chest: No acute findings. Other: Schmorl's node over the superior endplate of T2 unchanged. IMPRESSION: 1. 7 mm focus of acute intraparenchymal hemorrhage over the left frontal white matter. Very minimal acute subarachnoid hemorrhage over the adjacent right frontal lobe. Findings compatible with minor TBI BIG-2 categorization. 2. Chronic ischemic microvascular disease with stable mild encephalomalacia over the right frontal lobe. 3. Depressed comminuted left orbital floor fracture with partial entrapment of inferior rectus muscle. Fracture of the medial wall the left orbit without entrapment of intraorbital contents. 4. Minimally displaced nasal bone fracture as well as fracture of the junction of the left nasal bone to the frontal process of the left maxilla. 5. No acute cervical spine injury. 6. Mild spondylosis throughout the cervical spine with mild disc disease at the C5-6 and C6-7 levels. Central disc protrusion at the C3-4 level with more pronounced central disc protrusion at the C4-5 level as these findings would be better evaluated with MRI. Critical Value/emergent results were called by telephone at the time of interpretation on 06/28/2024 at 1:28 pm to provider ANDREW TEE , who verbally acknowledged these results. Electronically Signed   By: Toribio Agreste M.D.   On: 06/28/2024 13:29   DG Chest Port 1 View Result Date: 06/28/2024 EXAM: 1 VIEW(S) XRAY OF THE CHEST 06/28/2024 12:31:00 PM COMPARISON: 05/30/2024 CLINICAL HISTORY: trauma; fall on thinners FINDINGS: LUNGS AND PLEURA: Diffuse interstitial prominence. Bibasilar patchy opacities. No pulmonary edema. No pleural effusion. No pneumothorax. HEART AND MEDIASTINUM: Similar enlarged cardiomediastinal silhouette status post aortic and mitral valve prostheses. BONES AND SOFT TISSUES: Right axillary surgical clips. Nondisplaced median sternotomy wires. No acute osseous abnormality. IMPRESSION: 1. Diffuse interstitial  prominence with bibasilar patchy opacities, which may reflect edema or infection; correlate clinically. 2. Stable enlarged cardiomediastinal silhouette with aortic and mitral valve prostheses. Electronically signed by: Waddell Calk MD 06/28/2024 01:06 PM  EDT RP Workstation: [id number]    Review of Systems  Unable to perform ROS: Mental status change   Blood pressure (!) 110/48, pulse 65, temperature 98.7 F (37.1 C), temperature source Oral, resp. rate 15, height 5' 3 (1.6 m), weight 68 kg, SpO2 93%. Physical Exam Neurological:     Comments: Patient is awake and alert moves all extremities well with good strength and to commands cranial nerves appear to be intact     Assessment/Plan: 78 year old hospital day 1 close head injury small intraparenchymal contusion follow-up MRI scan shows stable hemorrhage no new strokes.  MRI cervical spine shows degenerative disc disease and spondylosis but no frank cord compression.  No neurosurgical intervention needed.  Recommend mobilize the patient with physical occupational therapy.  May also need speech therapy evaluate swallowing certainly okay to initiate diet if cleared by speech with either formal or bedside swallow evaluation  [name] 06/29/2024, 7:49 AM

## 2024-06-29 NOTE — Evaluation (Signed)
 Occupational Therapy Evaluation Patient Details Name: [name] MRN: [id number] DOB: [birth year] Today's Date: 06/29/2024   History of Present Illness   Pt is an 78 y.o. female presenting 9/26 after ground level fall. MRI brain with 1.2 cm acute intraparenchymal hemorrhage at the subcortical L frontal lobe, likely a small hemorrhagic contusion; intraventricular hemorrhage layering within the occipital horns of both lateral ventricles. L orbital fx PMH: HFpEF, A-fib/flutter on warfarin, rheumatic heart disease, AV and MV replacement, COPD, CVA, carotid artery stenosis, CKD-3B, NIDDM-2, hypothyroidism, lumbar laminectomy and decompression and mild cognitive impairment     Clinical Impressions PTA, pt lived with her husband who intermittently assists with ADL as needed for the past 3 months or so; pt endorses multiple recent falls. Upon eval, pt needing min-max A for BADL and cues for safety and sequencing during mobility. Pt with blurred vision and sensitivity to light. Due to significant change in functional status, recommending intensive multidisciplinary rehabilitation >3 hours/day to optimize safety and independence in ADL.     If plan is discharge home, recommend the following:   A little help with walking and/or transfers;A little help with bathing/dressing/bathroom;Assistance with cooking/housework;Assist for transportation;Help with stairs or ramp for entrance;Direct supervision/assist for medications management;Direct supervision/assist for financial management     Functional Status Assessment   Patient has had a recent decline in their functional status and demonstrates the ability to make significant improvements in function in a reasonable and predictable amount of time.     Equipment Recommendations   Other (comment) (defer)     Recommendations for Other Services   Rehab consult;Speech consult     Precautions/Restrictions   Precautions Precautions:  Fall Precaution/Restrictions Comments: ?syncope prior to fall; husband uncertain. adhered to cervical precautions as pt still in c collar     Mobility Bed Mobility Overal bed mobility: Needs Assistance Bed Mobility: Rolling, Sidelying to Sit Rolling: Min assist Sidelying to sit: Min assist       General bed mobility comments: roll to rt, no rail; slight incr time and guarding assist to get her balance    Transfers Overall transfer level: Needs assistance Equipment used: 2 person hand held assist Transfers: Sit to/from Stand, Bed to chair/wheelchair/BSC Sit to Stand: Min assist, +2 safety/equipment     Step pivot transfers: Min assist, +2 safety/equipment     General transfer comment: pt reporting dizziness; BP stable; +2 safety due to ?syncope resulting in fall      Balance Overall balance assessment: Needs assistance, History of Falls Sitting-balance support: No upper extremity supported, Feet supported Sitting balance-Leahy Scale: Fair     Standing balance support: Bilateral upper extremity supported Standing balance-Leahy Scale: Poor Standing balance comment: slight posterior lean                           ADL either performed or assessed with clinical judgement   ADL Overall ADL's : Needs assistance/impaired Eating/Feeding: Sitting;Minimal assistance   Grooming: Minimal assistance;Sitting   Upper Body Bathing: Minimal assistance;Sitting   Lower Body Bathing: Maximal assistance;Sit to/from stand   Upper Body Dressing : Minimal assistance;Sitting   Lower Body Dressing: Maximal assistance;Sit to/from stand Lower Body Dressing Details (indicate cue type and reason): unable to perform figure 4 Toilet Transfer: Minimal assistance;+2 for safety/equipment                   Vision Patient Visual Report: Blurring of vision Vision Assessment?: Vision impaired- to be further tested  in functional context Additional Comments: pt able to open L eye  with increased time noting hematoma on eye and around orbit. pt with significant swelling and bil bruising L>R. Pt keeping eyes closed most of session. pt sensitive to light, able to read OT badge, but unable to keep eyes open for tracking     Perception         Praxis         Pertinent Vitals/Pain Pain Assessment Pain Assessment: 0-10 Pain Score: 5  Pain Location: head Pain Descriptors / Indicators: Headache Pain Intervention(s): Monitored during session, Limited activity within patient's tolerance     Extremity/Trunk Assessment Upper Extremity Assessment Upper Extremity Assessment: Generalized weakness   Lower Extremity Assessment Lower Extremity Assessment: Defer to PT evaluation   Cervical / Trunk Assessment Cervical / Trunk Assessment: Other exceptions Cervical / Trunk Exceptions: remains in c-collar   Communication Communication Communication: No apparent difficulties   Cognition Arousal: Alert Behavior During Therapy: Flat affect Cognition: Cognition impaired     Awareness: Intellectual awareness intact, Online awareness impaired Memory impairment (select all impairments): Short-term memory Attention impairment (select first level of impairment): Sustained attention, Selective attention Executive functioning impairment (select all impairments): Initiation, Sequencing, Organization, Problem solving, Reasoning OT - Cognition Comments: cues for engagement in mobility and ADL; slow processing throughout. falls asleep easily/quickly. per husband, pt had a stroke several years ago and has poor memory and pt husband attributes this to the reason she defers most questions about home to him                 Following commands: Intact       Cueing  General Comments   Cueing Techniques: Verbal cues;Tactile cues  reports blurry vision with L eye nearly swollen shut; orthostatic BPs negative. supine: 89/47 (62); seated 99/54 (69); standing: 100/56; seated in chair  110.43 (63). pt on 1.5 L O2 throughout. Doffed at beginning of session with pt desat to 80% SpO2 so re-donned   Exercises     Shoulder Instructions      Home Living Family/patient expects to be discharged to:: Private residence Living Arrangements: Spouse/significant other Available Help at Discharge: Family;Available 24 hours/day Type of Home: House Home Access: Stairs to enter [employer] of Steps: 3 Entrance Stairs-Rails: Right;Left Home Layout: Two level;Able to live on main level with bedroom/bathroom     Bathroom Shower/Tub: Producer, television/film/video: Handicapped height     Home Equipment: Shower seat - built in;Grab bars - tub/shower (3 point cane)          Prior Functioning/Environment Prior Level of Function : Needs assist;History of Falls (last six months) (3 in 6 months)             Mobility Comments: using 3 point cane ADLs Comments: occasional assist but don't think she really needed help    OT Problem List: Decreased strength;Decreased activity tolerance;Impaired balance (sitting and/or standing);Decreased cognition;Decreased safety awareness;Decreased knowledge of use of DME or AE;Impaired vision/perception;Pain   OT Treatment/Interventions: Self-care/ADL training;Therapeutic exercise;DME and/or AE instruction;Cognitive remediation/compensation;Therapeutic activities;Patient/family education;Balance training      OT Goals(Current goals can be found in the care plan section)   Acute Rehab OT Goals Patient Stated Goal: get better OT Goal Formulation: With patient Time For Goal Achievement: 07/13/24 Potential to Achieve Goals: Good   OT Frequency:  Min 2X/week    Co-evaluation              AM-PAC OT 6 Clicks Daily  Activity     Outcome Measure Help from another person eating meals?: A Little Help from another person taking care of personal grooming?: A Little Help from another person toileting, which includes using  toliet, bedpan, or urinal?: A Lot Help from another person bathing (including washing, rinsing, drying)?: A Lot Help from another person to put on and taking off regular upper body clothing?: A Little Help from another person to put on and taking off regular lower body clothing?: A Lot 6 Click Score: 15   End of Session Equipment Utilized During Treatment: Gait belt;Rolling walker (2 wheels) Nurse Communication: Mobility status  Activity Tolerance: Patient tolerated treatment well Patient left: in chair;with call bell/phone within reach;with chair alarm set;with family/visitor present  OT Visit Diagnosis: Unsteadiness on feet (R26.81);Muscle weakness (generalized) (M62.81);Other symptoms and signs involving cognitive function;Pain;Low vision, both eyes (H54.2)                Time: 8597-8564 OT Time Calculation (min): 33 min Charges:  OT General Charges $OT Visit: 1 Visit OT Evaluation $OT Eval Moderate Complexity: 1 Mod  [name], OTR/L [facility] Acute Rehabilitation Office: [phone]   [name] 06/29/2024, 3:56 PM

## 2024-06-29 NOTE — Progress Notes (Signed)
 Transition of Care [facility]) - CAGE-AID Screening   Patient Details  Name: [name] MRN: [id number] Date of Birth: [birth year]   Sara CHRISTELLA Rouleau, RN Trauma Response Nurse Phone Number: [phone] 06/29/2024, 12:39 PM   CAGE-AID Screening:    Have You Ever Felt You Ought to Cut Down on Your Drinking or Drug Use?: No Have People Annoyed You By Critizing Your Drinking Or Drug Use?: No Have You Felt Bad Or Guilty About Your Drinking Or Drug Use?: No Have You Ever Had a Drink or Used Drugs First Thing In The Morning to Steady Your Nerves or to Get Rid of a Hangover?: No CAGE-AID Score: 0  Substance Abuse Education Offered: (S) No (services not needed at this time. Pt has a hx of stroke and lives with husband- does not drink)

## 2024-06-29 NOTE — Evaluation (Signed)
 Physical Therapy Evaluation Patient Details Name: [name] MRN: [id number] DOB: October 03, 1946 Today's Date: 06/29/2024  History of Present Illness  Pt is an 78 y.o. female presenting 9/26 after ground level fall. MRI brain with 1.2 cm acute intraparenchymal hemorrhage at the subcortical L frontal lobe, likely a small hemorrhagic contusion; intraventricular hemorrhage layering within the occipital horns of both lateral ventricles. L orbital fx PMH: HFpEF, A-fib/flutter on warfarin, rheumatic heart disease, AV and MV replacement, COPD, CVA, carotid artery stenosis, CKD-3B, NIDDM-2, hypothyroidism, lumbar laminectomy and decompression and mild cognitive impairment  Clinical Impression   Pt admitted secondary to problem above with deficits below. PTA patient lives with spouse in two level home (though pt can live on first floor). She has 2 steps to enter with rail. She normally walks with a 3-point cane (?triangular rubber tip). She has had 3 falls in 6 months.  Pt currently required min assist for bed mobility while maintaining cervical precautions (still has c-collar). She required min assist +2 for transfer to standing and pivotal steps to chair. She was tried on room air with sats decr 82% while seated, therefore 1.5 L O2 resumed with sats recovering to 90%. Discussed likely need for post-acute inpatient therapies (recommend >3 hrs/day); husband hopeful she will be able to discharge straight home. Anticipate patient will benefit from PT to address problems listed below. Will continue to follow acutely to maximize functional mobility, independence, and safety.           If plan is discharge home, recommend the following: A lot of help with walking and/or transfers;A lot of help with bathing/dressing/bathroom;Assistance with cooking/housework;Direct supervision/assist for medications management;Direct supervision/assist for financial management;Assist for transportation;Help with stairs or ramp for  entrance;Supervision due to cognitive status   Can travel by private vehicle        Equipment Recommendations Rolling walker (2 wheels)  Recommendations for Other Services  Rehab consult    Functional Status Assessment Patient has had a recent decline in their functional status and demonstrates the ability to make significant improvements in function in a reasonable and predictable amount of time.     Precautions / Restrictions Precautions Precautions: Fall Precaution/Restrictions Comments: ?syncope prior to fall; husband uncertain      Mobility  Bed Mobility Overal bed mobility: Needs Assistance Bed Mobility: Rolling, Sidelying to Sit Rolling: Min assist Sidelying to sit: Min assist       General bed mobility comments: roll to rt, no rail; slight incr time and guarding assist to get her balance    Transfers Overall transfer level: Needs assistance Equipment used: 2 person hand held assist Transfers: Sit to/from Stand, Bed to chair/wheelchair/BSC Sit to Stand: Min assist, +2 safety/equipment   Step pivot transfers: Min assist, +2 safety/equipment       General transfer comment: pt reporting dizziness; BP stable; +2 safety due to ?syncope resulting in fall    Ambulation/Gait               General Gait Details: unable due to sats drop to 82% on room air while seated EOB; O2 resumed  Stairs            Wheelchair Mobility     Tilt Bed    Modified Rankin (Stroke Patients Only)       Balance Overall balance assessment: Needs assistance, History of Falls Sitting-balance support: No upper extremity supported, Feet supported Sitting balance-Leahy Scale: Fair     Standing balance support: Bilateral upper extremity supported Standing balance-Leahy Scale:  Poor Standing balance comment: slight posterior lean                             Pertinent Vitals/Pain Pain Assessment Pain Assessment: 0-10 Pain Score: 5  Pain Location:  head Pain Descriptors / Indicators: Headache Pain Intervention(s): Limited activity within patient's tolerance, Monitored during session, Patient requesting pain meds-RN notified    Home Living Family/patient expects to be discharged to:: Private residence Living Arrangements: Spouse/significant other Available Help at Discharge: Family;Available 24 hours/day Type of Home: House Home Access: Stairs to enter Entrance Stairs-Rails: Doctor, general practice of Steps: 3   Home Layout: Two level;Able to live on main level with bedroom/bathroom Home Equipment: Shower seat - built in;Grab bars - tub/shower (3 point cane)      Prior Function Prior Level of Function : Needs assist;History of Falls (last six months) (3 in 6 months)             Mobility Comments: using 3 point cane ADLs Comments: occasional assist but don't think she really needed help     Extremity/Trunk Assessment   Upper Extremity Assessment Upper Extremity Assessment: Defer to OT evaluation    Lower Extremity Assessment Lower Extremity Assessment: Generalized weakness    Cervical / Trunk Assessment Cervical / Trunk Assessment: Other exceptions Cervical / Trunk Exceptions: remains in c-collar  Communication   Communication Communication: No apparent difficulties    Cognition Arousal: Alert Behavior During Therapy: Flat affect   PT - Cognitive impairments: History of cognitive impairments, Memory                       PT - Cognition Comments: per spouse pt has had memory problems since her CVA Following commands: Intact       Cueing Cueing Techniques: Verbal cues, Tactile cues     General Comments General comments (skin integrity, edema, etc.): reports blurry vision with L eye nearly swollen shut; orthostatic BPs negative (see OT note for BPs)    Exercises     Assessment/Plan    PT Assessment Patient needs continued PT services  PT Problem List Decreased  strength;Decreased activity tolerance;Decreased balance;Decreased mobility;Decreased cognition;Decreased knowledge of use of DME;Decreased knowledge of precautions;Pain       PT Treatment Interventions DME instruction;Gait training;Stair training;Functional mobility training;Therapeutic activities;Therapeutic exercise;Balance training;Cognitive remediation;Patient/family education    PT Goals (Current goals can be found in the Care Plan section)  Acute Rehab PT Goals Patient Stated Goal: feel better PT Goal Formulation: With patient/family Time For Goal Achievement: 07/13/24 Potential to Achieve Goals: Good    Frequency Min 2X/week     Co-evaluation               AM-PAC PT 6 Clicks Mobility  Outcome Measure Help needed turning from your back to your side while in a flat bed without using bedrails?: A Little Help needed moving from lying on your back to sitting on the side of a flat bed without using bedrails?: A Little Help needed moving to and from a bed to a chair (including a wheelchair)?: Total Help needed standing up from a chair using your arms (e.g., wheelchair or bedside chair)?: Total Help needed to walk in hospital room?: Total Help needed climbing 3-5 steps with a railing? : Total 6 Click Score: 10    End of Session Equipment Utilized During Treatment: Gait belt;Oxygen  Activity Tolerance: Treatment limited secondary to medical complications (Comment) (drop in sats  on RA) Patient left: in chair;with call bell/phone within reach;with chair alarm set;with family/visitor present Nurse Communication: Mobility status;Patient requests pain meds PT Visit Diagnosis: Unsteadiness on feet (R26.81);History of falling (Z91.81);Pain Pain - Right/Left: Left Pain - part of body:  (eye/head)    Time: 8597-8566 PT Time Calculation (min) (ACUTE ONLY): 31 min   Charges:   PT Evaluation $PT Eval Low Complexity: 1 Low   PT General Charges $$ ACUTE PT VISIT: 1 Visit           [name], PT Acute Rehabilitation Services  Office [phone]   [name] 06/29/2024, 3:04 PM

## 2024-06-29 NOTE — Progress Notes (Signed)

## 2024-06-29 NOTE — Care Management Obs Status (Signed)
 MEDICARE OBSERVATION STATUS NOTIFICATION   Patient Details  Name: [name] MRN: [id number] Date of Birth: 11-Feb-1946   Medicare Observation Status Notification Given:  Yes    Robynn Eileen Hoose, RN 06/29/2024, 10:25 AM

## 2024-06-29 NOTE — Progress Notes (Signed)
 Echocardiogram 2D Echocardiogram has been performed.  Thea Norlander 06/29/2024, 12:39 PM

## 2024-06-30 DIAGNOSIS — S06369A Traumatic hemorrhage of cerebrum, unspecified, with loss of consciousness of unspecified duration, initial encounter: Secondary | ICD-10-CM | POA: Diagnosis present

## 2024-06-30 DIAGNOSIS — E039 Hypothyroidism, unspecified: Secondary | ICD-10-CM | POA: Diagnosis present

## 2024-06-30 DIAGNOSIS — S0181XA Laceration without foreign body of other part of head, initial encounter: Secondary | ICD-10-CM | POA: Diagnosis present

## 2024-06-30 DIAGNOSIS — I4819 Other persistent atrial fibrillation: Secondary | ICD-10-CM | POA: Diagnosis not present

## 2024-06-30 DIAGNOSIS — N1832 Chronic kidney disease, stage 3b: Secondary | ICD-10-CM | POA: Diagnosis not present

## 2024-06-30 DIAGNOSIS — M47814 Spondylosis without myelopathy or radiculopathy, thoracic region: Secondary | ICD-10-CM | POA: Diagnosis not present

## 2024-06-30 DIAGNOSIS — R55 Syncope and collapse: Secondary | ICD-10-CM | POA: Diagnosis not present

## 2024-06-30 DIAGNOSIS — Z043 Encounter for examination and observation following other accident: Secondary | ICD-10-CM | POA: Diagnosis not present

## 2024-06-30 DIAGNOSIS — S01511A Laceration without foreign body of lip, initial encounter: Secondary | ICD-10-CM

## 2024-06-30 DIAGNOSIS — S0993XA Unspecified injury of face, initial encounter: Secondary | ICD-10-CM | POA: Diagnosis not present

## 2024-06-30 DIAGNOSIS — M47816 Spondylosis without myelopathy or radiculopathy, lumbar region: Secondary | ICD-10-CM | POA: Diagnosis not present

## 2024-06-30 DIAGNOSIS — S06321A Contusion and laceration of left cerebrum with loss of consciousness of 30 minutes or less, initial encounter: Secondary | ICD-10-CM | POA: Diagnosis not present

## 2024-06-30 DIAGNOSIS — E119 Type 2 diabetes mellitus without complications: Secondary | ICD-10-CM | POA: Diagnosis not present

## 2024-06-30 DIAGNOSIS — E1122 Type 2 diabetes mellitus with diabetic chronic kidney disease: Secondary | ICD-10-CM | POA: Diagnosis present

## 2024-06-30 DIAGNOSIS — M549 Dorsalgia, unspecified: Secondary | ICD-10-CM | POA: Diagnosis not present

## 2024-06-30 DIAGNOSIS — Y92511 Restaurant or cafe as the place of occurrence of the external cause: Secondary | ICD-10-CM | POA: Diagnosis not present

## 2024-06-30 DIAGNOSIS — W010XXA Fall on same level from slipping, tripping and stumbling without subsequent striking against object, initial encounter: Secondary | ICD-10-CM | POA: Diagnosis present

## 2024-06-30 DIAGNOSIS — I4581 Long QT syndrome: Secondary | ICD-10-CM | POA: Diagnosis not present

## 2024-06-30 DIAGNOSIS — I4892 Unspecified atrial flutter: Secondary | ICD-10-CM | POA: Diagnosis present

## 2024-06-30 DIAGNOSIS — S0232XA Fracture of orbital floor, left side, initial encounter for closed fracture: Secondary | ICD-10-CM | POA: Diagnosis not present

## 2024-06-30 DIAGNOSIS — S0240DA Maxillary fracture, left side, initial encounter for closed fracture: Secondary | ICD-10-CM | POA: Diagnosis present

## 2024-06-30 DIAGNOSIS — W19XXXA Unspecified fall, initial encounter: Secondary | ICD-10-CM | POA: Diagnosis not present

## 2024-06-30 DIAGNOSIS — M4804 Spinal stenosis, thoracic region: Secondary | ICD-10-CM | POA: Diagnosis not present

## 2024-06-30 DIAGNOSIS — Z794 Long term (current) use of insulin: Secondary | ICD-10-CM | POA: Diagnosis not present

## 2024-06-30 DIAGNOSIS — D6869 Other thrombophilia: Secondary | ICD-10-CM | POA: Diagnosis present

## 2024-06-30 DIAGNOSIS — S0633AA Contusion and laceration of cerebrum, unspecified, with loss of consciousness status unknown, initial encounter: Secondary | ICD-10-CM | POA: Diagnosis not present

## 2024-06-30 DIAGNOSIS — M48061 Spinal stenosis, lumbar region without neurogenic claudication: Secondary | ICD-10-CM | POA: Diagnosis not present

## 2024-06-30 DIAGNOSIS — S0990XA Unspecified injury of head, initial encounter: Secondary | ICD-10-CM | POA: Diagnosis not present

## 2024-06-30 DIAGNOSIS — S0632AA Contusion and laceration of left cerebrum with loss of consciousness status unknown, initial encounter: Secondary | ICD-10-CM | POA: Diagnosis not present

## 2024-06-30 DIAGNOSIS — Z952 Presence of prosthetic heart valve: Secondary | ICD-10-CM | POA: Diagnosis not present

## 2024-06-30 DIAGNOSIS — E1165 Type 2 diabetes mellitus with hyperglycemia: Secondary | ICD-10-CM | POA: Diagnosis present

## 2024-06-30 DIAGNOSIS — D6832 Hemorrhagic disorder due to extrinsic circulating anticoagulants: Secondary | ICD-10-CM | POA: Diagnosis present

## 2024-06-30 DIAGNOSIS — M19011 Primary osteoarthritis, right shoulder: Secondary | ICD-10-CM | POA: Diagnosis not present

## 2024-06-30 DIAGNOSIS — T45515A Adverse effect of anticoagulants, initial encounter: Secondary | ICD-10-CM | POA: Diagnosis present

## 2024-06-30 DIAGNOSIS — I611 Nontraumatic intracerebral hemorrhage in hemisphere, cortical: Secondary | ICD-10-CM | POA: Diagnosis not present

## 2024-06-30 DIAGNOSIS — S061X9A Traumatic cerebral edema with loss of consciousness of unspecified duration, initial encounter: Secondary | ICD-10-CM | POA: Diagnosis present

## 2024-06-30 DIAGNOSIS — S0181XD Laceration without foreign body of other part of head, subsequent encounter: Secondary | ICD-10-CM | POA: Diagnosis not present

## 2024-06-30 DIAGNOSIS — S02832A Fracture of medial orbital wall, left side, initial encounter for closed fracture: Secondary | ICD-10-CM | POA: Diagnosis present

## 2024-06-30 DIAGNOSIS — E1143 Type 2 diabetes mellitus with diabetic autonomic (poly)neuropathy: Secondary | ICD-10-CM | POA: Diagnosis present

## 2024-06-30 DIAGNOSIS — J4489 Other specified chronic obstructive pulmonary disease: Secondary | ICD-10-CM | POA: Diagnosis present

## 2024-06-30 DIAGNOSIS — I130 Hypertensive heart and chronic kidney disease with heart failure and stage 1 through stage 4 chronic kidney disease, or unspecified chronic kidney disease: Secondary | ICD-10-CM | POA: Diagnosis present

## 2024-06-30 DIAGNOSIS — S022XXA Fracture of nasal bones, initial encounter for closed fracture: Secondary | ICD-10-CM | POA: Diagnosis present

## 2024-06-30 DIAGNOSIS — I10 Essential (primary) hypertension: Secondary | ICD-10-CM | POA: Diagnosis not present

## 2024-06-30 DIAGNOSIS — Z953 Presence of xenogenic heart valve: Secondary | ICD-10-CM | POA: Diagnosis not present

## 2024-06-30 DIAGNOSIS — R710 Precipitous drop in hematocrit: Secondary | ICD-10-CM | POA: Diagnosis not present

## 2024-06-30 DIAGNOSIS — I4891 Unspecified atrial fibrillation: Secondary | ICD-10-CM | POA: Diagnosis not present

## 2024-06-30 DIAGNOSIS — I5032 Chronic diastolic (congestive) heart failure: Secondary | ICD-10-CM | POA: Diagnosis not present

## 2024-06-30 LAB — CBC
HCT: 37.6 % (ref 36.0–46.0)
Hemoglobin: 11.3 g/dL — ABNORMAL LOW (ref 12.0–15.0)
MCH: 26.5 pg (ref 26.0–34.0)
MCHC: 30.1 g/dL (ref 30.0–36.0)
MCV: 88.3 fL (ref 80.0–100.0)
Platelets: 157 K/uL (ref 150–400)
RBC: 4.26 MIL/uL (ref 3.87–5.11)
RDW: 16.2 % — ABNORMAL HIGH (ref 11.5–15.5)
WBC: 7 K/uL (ref 4.0–10.5)
nRBC: 0 % (ref 0.0–0.2)

## 2024-06-30 LAB — BASIC METABOLIC PANEL WITH GFR
Anion gap: 15 (ref 5–15)
BUN: 14 mg/dL (ref 8–23)
CO2: 27 mmol/L (ref 22–32)
Calcium: 7.8 mg/dL — ABNORMAL LOW (ref 8.9–10.3)
Chloride: 98 mmol/L (ref 98–111)
Creatinine, Ser: 1.43 mg/dL — ABNORMAL HIGH (ref 0.44–1.00)
GFR, Estimated: 37 mL/min — ABNORMAL LOW (ref >60)
Glucose, Bld: 86 mg/dL (ref 70–99)
Potassium: 2.8 mmol/L — ABNORMAL LOW (ref 3.5–5.1)
Sodium: 140 mmol/L (ref 135–145)

## 2024-06-30 LAB — GLUCOSE, CAPILLARY
Glucose-Capillary: 103 mg/dL — ABNORMAL HIGH (ref 70–99)
Glucose-Capillary: 94 mg/dL (ref 70–99)
Glucose-Capillary: 152 mg/dL — ABNORMAL HIGH (ref 70–99)
Glucose-Capillary: 127 mg/dL — ABNORMAL HIGH (ref 70–99)
Glucose-Capillary: 126 mg/dL — ABNORMAL HIGH (ref 70–99)

## 2024-06-30 MED ORDER — POTASSIUM CHLORIDE CRYS ER 20 MEQ PO TBCR
40.0000 meq | EXTENDED_RELEASE_TABLET | Freq: Two times a day (BID) | ORAL | Status: DC
  Administered 2024-06-30 – 2024-07-01 (×3): 40 meq via ORAL
  Filled 2024-06-30 (×3): qty 2

## 2024-06-30 MED ORDER — INFLUENZA VAC SPLIT HIGH-DOSE 0.5 ML IM SUSY
0.5000 mL | PREFILLED_SYRINGE | INTRAMUSCULAR | Status: DC
Start: 2024-07-01 — End: 2024-07-10
  Filled 2024-06-30: qty 0.5

## 2024-06-30 MED ORDER — POTASSIUM CHLORIDE 10 MEQ/100ML IV SOLN
10.0000 meq | INTRAVENOUS | Status: AC
  Administered 2024-06-30 (×4): 10 meq via INTRAVENOUS
  Filled 2024-06-30 (×4): qty 100

## 2024-06-30 MED ORDER — ENSURE PLUS HIGH PROTEIN PO LIQD
237.0000 mL | Freq: Two times a day (BID) | ORAL | Status: DC
Start: 2024-06-30 — End: 2024-07-10
  Administered 2024-06-30 – 2024-07-09 (×16): 237 mL via ORAL

## 2024-06-30 MED ORDER — OXYCODONE HCL 5 MG PO TABS
5.0000 mg | ORAL_TABLET | ORAL | Status: DC | PRN
  Administered 2024-06-30 – 2024-07-09 (×9): 5 mg via ORAL
  Filled 2024-06-30 (×11): qty 1

## 2024-06-30 NOTE — Progress Notes (Signed)
 Patient ID: [name], female   DOB: 1945-10-05, 78 y.o.   MRN: [id number] Patient stable no significant headache.  Past syncopal workup in progress no new neurosurgical recommendations.  No follow-up imaging needed per brain for the contusion

## 2024-06-30 NOTE — Plan of Care (Signed)
 Problem: Education: Goal: Ability to describe self-care measures that may prevent or decrease complications (Diabetes Survival Skills Education) will improve 06/30/2024 0108 by Marvis Kenneth SAILOR, RN Outcome: Progressing 06/29/2024 2018 by Marvis Kenneth SAILOR, RN Outcome: Progressing Goal: Individualized Educational Video(s) 06/30/2024 0108 by Marvis Kenneth SAILOR, RN Outcome: Progressing 06/29/2024 2018 by Marvis Kenneth SAILOR, RN Outcome: Progressing   Problem: Coping: Goal: Ability to adjust to condition or change in health will improve 06/30/2024 0108 by Marvis Kenneth SAILOR, RN Outcome: Progressing 06/29/2024 2018 by Marvis Kenneth SAILOR, RN Outcome: Progressing   Problem: Fluid Volume: Goal: Ability to maintain a balanced intake and output will improve 06/30/2024 0108 by Marvis Kenneth SAILOR, RN Outcome: Progressing 06/29/2024 2018 by Marvis Kenneth SAILOR, RN Outcome: Progressing   Problem: Health Behavior/Discharge Planning: Goal: Ability to identify and utilize available resources and services will improve 06/30/2024 0108 by Marvis Kenneth SAILOR, RN Outcome: Progressing 06/29/2024 2018 by Marvis Kenneth SAILOR, RN Outcome: Progressing Goal: Ability to manage health-related needs will improve 06/30/2024 0108 by Marvis Kenneth SAILOR, RN Outcome: Progressing 06/29/2024 2018 by Marvis Kenneth SAILOR, RN Outcome: Progressing   Problem: Metabolic: Goal: Ability to maintain appropriate glucose levels will improve 06/30/2024 0108 by Marvis Kenneth SAILOR, RN Outcome: Progressing 06/29/2024 2018 by Marvis Kenneth SAILOR, RN Outcome: Progressing   Problem: Nutritional: Goal: Maintenance of adequate nutrition will improve 06/30/2024 0108 by Marvis Kenneth SAILOR, RN Outcome: Progressing 06/29/2024 2018 by Marvis Kenneth SAILOR, RN Outcome: Progressing Goal: Progress toward achieving an optimal weight will improve 06/30/2024 0108 by Marvis Kenneth SAILOR, RN Outcome: Progressing 06/29/2024 2018 by Marvis Kenneth SAILOR,  RN Outcome: Progressing   Problem: Skin Integrity: Goal: Risk for impaired skin integrity will decrease 06/30/2024 0108 by Marvis Kenneth SAILOR, RN Outcome: Progressing 06/29/2024 2018 by Marvis Kenneth SAILOR, RN Outcome: Progressing   Problem: Tissue Perfusion: Goal: Adequacy of tissue perfusion will improve 06/30/2024 0108 by Marvis Kenneth SAILOR, RN Outcome: Progressing 06/29/2024 2018 by Marvis Kenneth SAILOR, RN Outcome: Progressing   Problem: Education: Goal: Knowledge of General Education information will improve Description: Including pain rating scale, medication(s)/side effects and non-pharmacologic comfort measures 06/30/2024 0108 by Marvis Kenneth SAILOR, RN Outcome: Progressing 06/29/2024 2018 by Marvis Kenneth SAILOR, RN Outcome: Progressing   Problem: Health Behavior/Discharge Planning: Goal: Ability to manage health-related needs will improve 06/30/2024 0108 by Marvis Kenneth SAILOR, RN Outcome: Progressing 06/29/2024 2018 by Marvis Kenneth SAILOR, RN Outcome: Progressing   Problem: Clinical Measurements: Goal: Ability to maintain clinical measurements within normal limits will improve 06/30/2024 0108 by Marvis Kenneth SAILOR, RN Outcome: Progressing 06/29/2024 2018 by Marvis Kenneth SAILOR, RN Outcome: Progressing Goal: Will remain free from infection 06/30/2024 0108 by Marvis Kenneth SAILOR, RN Outcome: Progressing 06/29/2024 2018 by Marvis Kenneth SAILOR, RN Outcome: Progressing Goal: Diagnostic test results will improve 06/30/2024 0108 by Marvis Kenneth SAILOR, RN Outcome: Progressing 06/29/2024 2018 by Marvis Kenneth SAILOR, RN Outcome: Progressing Goal: Respiratory complications will improve 06/30/2024 0108 by Marvis Kenneth SAILOR, RN Outcome: Progressing 06/29/2024 2018 by Marvis Kenneth SAILOR, RN Outcome: Progressing Goal: Cardiovascular complication will be avoided 06/30/2024 0108 by Marvis Kenneth SAILOR, RN Outcome: Progressing 06/29/2024 2018 by Marvis Kenneth SAILOR, RN Outcome: Progressing    Problem: Activity: Goal: Risk for activity intolerance will decrease 06/30/2024 0108 by Marvis Kenneth SAILOR, RN Outcome: Progressing 06/29/2024 2018 by Marvis Kenneth SAILOR, RN Outcome: Progressing   Problem: Nutrition: Goal: Adequate nutrition will be maintained 06/30/2024 0108 by Marvis Kenneth SAILOR, RN Outcome: Progressing 06/29/2024 2018 by Marvis Kenneth SAILOR, RN Outcome: Progressing  Problem: Coping: Goal: Level of anxiety will decrease 06/30/2024 0108 by Marvis Kenneth SAILOR, RN Outcome: Progressing 06/29/2024 2018 by Marvis Kenneth SAILOR, RN Outcome: Progressing   Problem: Elimination: Goal: Will not experience complications related to bowel motility 06/30/2024 0108 by Marvis Kenneth SAILOR, RN Outcome: Progressing 06/29/2024 2018 by Marvis Kenneth SAILOR, RN Outcome: Progressing Goal: Will not experience complications related to urinary retention 06/30/2024 0108 by Marvis Kenneth SAILOR, RN Outcome: Progressing 06/29/2024 2018 by Marvis Kenneth SAILOR, RN Outcome: Progressing   Problem: Pain Managment: Goal: General experience of comfort will improve and/or be controlled 06/30/2024 0108 by Marvis Kenneth SAILOR, RN Outcome: Progressing 06/29/2024 2018 by Marvis Kenneth SAILOR, RN Outcome: Progressing   Problem: Safety: Goal: Ability to remain free from injury will improve 06/30/2024 0108 by Marvis Kenneth SAILOR, RN Outcome: Progressing 06/29/2024 2018 by Marvis Kenneth SAILOR, RN Outcome: Progressing   Problem: Skin Integrity: Goal: Risk for impaired skin integrity will decrease 06/30/2024 0108 by Marvis Kenneth SAILOR, RN Outcome: Progressing 06/29/2024 2018 by Marvis Kenneth SAILOR, RN Outcome: Progressing

## 2024-06-30 NOTE — Progress Notes (Signed)
 Progress Note  Patient Name: [name] Date of Encounter: 06/30/2024  Primary Cardiologist: Alvan Carrier, MD   Subjective   Pain controlled at present.   I had a long discussion with the patient's husband.  He reports the patient had another fall about 2 weeks ago.  She did not lose consciousness, she just became weak and fell backwards.  She does have unsteadiness after standing up and has to stabilize and regain her equilibrium after standing.  Inpatient Medications    Scheduled Meds:  amiodarone   200 mg Oral Daily   arformoterol   15 mcg Nebulization BID   atorvastatin   40 mg Oral q AM   budesonide  (PULMICORT ) nebulizer solution  0.5 mg Nebulization BID   dapagliflozin  propanediol  10 mg Oral q AM   escitalopram   10 mg Oral Q breakfast   insulin  aspart  0-6 Units Subcutaneous Q4H   levothyroxine   100 mcg Oral QAC breakfast   losartan   25 mg Oral q AM   multivitamin with minerals  1 tablet Oral q AM   pantoprazole   40 mg Oral QAC breakfast   potassium chloride  SA  40 mEq Oral BID   revefenacin   175 mcg Nebulization Daily   torsemide   30 mg Oral Daily   trimethobenzamide   200 mg Intramuscular Once   Continuous Infusions:  ampicillin -sulbactam (UNASYN ) IV 3 g (06/30/24 0508)   potassium chloride      PRN Meds: acetaminophen  **OR** acetaminophen , HYDROmorphone  (DILAUDID ) injection, ipratropium-albuterol , ondansetron  **OR** ondansetron  (ZOFRAN ) IV, oxyCODONE    Vital Signs    Vitals:   06/29/24 2035 06/29/24 2317 06/30/24 0356 06/30/24 0736  BP:  (!) 119/58 (!) 144/54 (!) 150/54  Pulse:  (!) 59 61 (!) 59  Resp:  13  14  Temp:  98.4 F (36.9 C) 98.5 F (36.9 C) 98.8 F (37.1 C)  TempSrc:  Oral Oral Oral  SpO2: 98% 99% 98% 99%  Weight:      Height:        Intake/Output Summary (Last 24 hours) at 06/30/2024 0841 Last data filed at 06/30/2024 0358 Gross per 24 hour  Intake 416.6 ml  Output 1650 ml  Net -1233.4 ml   Filed Weights   06/28/24 1223   Weight: 68 kg    Telemetry    Sinus rhythm - Personally Reviewed  ECG    Sinus rhythm, prolonged QT - Personally Reviewed  Physical Exam  GEN: No acute distress.   Cardiac: RRR, no murmurs, rubs, or gallops.  Respiratory: Clear to auscultation bilaterally. GI: Soft, nontender, non-distended  MS: No edema; Face with ecchymosis, edema   Labs    Chemistry Recent Labs  Lab 06/28/24 1219 06/28/24 1222 06/29/24 0617 06/30/24 0558  NA 138 137 143 140  K 3.9 3.9 3.1* 2.8*  CL 99 99 99 98  CO2 25  --  28 27  GLUCOSE 121* 122* 95 86  BUN 16 22 15 14   CREATININE 1.75* 1.90* 1.56* 1.43*  CALCIUM  8.8*  --  8.2* 7.8*  PROT 7.2  --  6.4*  --   ALBUMIN  3.8  --  3.2*  --   AST 55*  --  25  --   ALT 26  --  19  --   ALKPHOS 62  --  58  --   BILITOT 1.4*  --  1.1  --   GFRNONAA 29*  --  33* 37*  ANIONGAP 14  --  16* 15     Hematology Recent Labs  Lab 06/28/24 1219 06/28/24 1222 06/29/24 0617 06/30/24 0558  WBC 8.0  --  6.5 7.0  RBC 4.60  --  4.11 4.26  HGB 12.2 13.9 10.9* 11.3*  HCT 39.9 41.0 35.7* 37.6  MCV 86.7  --  86.9 88.3  MCH 26.5  --  26.5 26.5  MCHC 30.6  --  30.5 30.1  RDW 16.3*  --  16.1* 16.2*  PLT 191  --  164 157    Cardiac EnzymesNo results for input(s): TROPONINI in the last 168 hours. No results for input(s): TROPIPOC in the last 168 hours.   BNPNo results for input(s): BNP, PROBNP in the last 168 hours.   DDimer No results for input(s): DDIMER in the last 168 hours.   Summary of Pertinent studies    TTE: LVEF 70 to 75%.  Mitral valve was replaced.  Mild mitral stenosis with Edwards bioprosthetic valve present.  Bioprosthetic aortic valve with no stenosis or regurgitation.  LVEF is 70 to 75%.  12/19/2023: EF 55%, Gr II diastolic dysfunction.  Mild to moderately dilated left atrium, normal right atrium.  Status post MVR and AVR with bioprosthesis.  Prosthetic valves appear to be functioning normally.    Patient Profile     78  y.o. female with a history of rheumatic valve disease (MS/AI/AS ) status post pericardial MVR & AVR 2014, CHFimpEF, persistent atrial fibrillation/flutter, stroke in 2013 in the setting of left atrial pended thrombus, mild coronary artery disease, obstructive lung disease, vasovagal syncope seen today for evaluation of possible syncope.  Assessment & Plan    Fall -- possible syncope Resulting in intracranial hemorrhage, orbital fractures Prior syncope years ago attributed to vagal syncope in the setting of shoulder pain; more recent fall sounds more orthostatic Prodrome of confusion prior to fall prior to this admit Witnessed (husband) thinks she tripped and fell rather than had loss of consciousness Possible cardiogenic causes would include recurrence of AF with post-conversion pause, TdP due to prolonged QT -- it isn't clear though that she had syncope at all. I think ILR would be appropriate -- for monitoring post-syncope and management of AF/flutter.  S/P pericardial MVR, AVR in 2014 Valve replacement in 2014 due to rheumatic heart disease No severe stenosis on echocardiogram this admission (despite hyperdynamic EF)  Persistent atrial fibrillation and flutter On chronic amiodarone  Anticoagulation chronically with warfarin, currently held Would continue amiodarone  for now. Mainating sinus rhythm will reduce stroke risk while off anticoagulation. Will reassess when more medically stable and anticoagulation has resumed and is therapeutic  Secondary hypercoagulable state History of prior cardioembolic stroke On warfarin chronically due to valvular AF Warfarin held, not reversed per neurosurgery  Prolonged QT On amiodarone  Keep potassium > 4, magnesium  > 2 --aggressively Hold other rate-slowing medications; avoid bradycardia Repeat 12-lead today  Hypertension On valsartan 25, bisoprolol  5 as outpatient Husband reports systolic blood pressures at home typically in the 100s BPs often  elevated here --continue valsartan for now  For questions or updates, please contact [facility] Please consult [email or web address] for contact info under Cardiology/STEMI.      Signed, Eulas FORBES F[place], MD 06/30/2024, 8:41 AM

## 2024-06-30 NOTE — Progress Notes (Signed)
 Physical Therapy Treatment Patient Details Name: [name] MRN: [id number] DOB: 02/21/1946 Today's Date: 06/30/2024   History of Present Illness Pt is an 78 y.o. female presenting 9/26 after ground level fall. MRI brain with 1.2 cm acute intraparenchymal hemorrhage at the subcortical L frontal lobe, likely a small hemorrhagic contusion; intraventricular hemorrhage layering within the occipital horns of both lateral ventricles. L orbital fx PMH: HFpEF, A-fib/flutter on warfarin, rheumatic heart disease, AV and MV replacement, COPD, CVA, carotid artery stenosis, CKD-3B, NIDDM-2, hypothyroidism, lumbar laminectomy and decompression and mild cognitive impairment    PT Comments  Patient able to progress to ambulation today on 1L O2 (poor pleth when walking) with RW and +2 min assist. Due to h/o ?syncope, +dizziness used +2 for close chair follow. Pt's max distance was 12 ft when she began to look unwell and balance wavered. Returned to sitting with BP 112/47 and pt feeling better once seated. Making progress towards goals.     If plan is discharge home, recommend the following: A lot of help with walking and/or transfers;A lot of help with bathing/dressing/bathroom;Assistance with cooking/housework;Direct supervision/assist for medications management;Direct supervision/assist for financial management;Assist for transportation;Help with stairs or ramp for entrance;Supervision due to cognitive status   Can travel by private vehicle        Equipment Recommendations  Rolling walker (2 wheels)    Recommendations for Other Services       Precautions / Restrictions Precautions Precautions: Fall Precaution/Restrictions Comments: ?syncope prior to fall; husband uncertain. adhered to cervical precautions as pt still in c collar     Mobility  Bed Mobility Overal bed mobility: Needs Assistance Bed Mobility: Rolling, Sidelying to Sit Rolling: Contact guard assist Sidelying to sit: Min assist        General bed mobility comments: roll to rt, no rail; slight incr time and guarding assist to get her balance    Transfers Overall transfer level: Needs assistance Equipment used: Rolling walker (2 wheels) Transfers: Sit to/from Stand Sit to Stand: Min assist           General transfer comment: x 3 reps with vc needed each time for hand placement with RW    Ambulation/Gait Ambulation/Gait assistance: Min assist, +2 safety/equipment (chair follow) Gait Distance (Feet): 5 Feet (incontinent urine; cleaned; 12 dizzy) Assistive device: Rolling walker (2 wheels) Gait Pattern/deviations: Step-through pattern, Decreased stride length, Shuffle   Gait velocity interpretation: <1.8 ft/sec, indicate of risk for recurrent falls   General Gait Details: assist to steer/maneuver RW; after 12 ft pt began to sway ant/post and reported she was dizzy; sat in chair with seated BP 112/47. Pt reported dizziness improving with sitting   Stairs             Wheelchair Mobility     Tilt Bed    Modified Rankin (Stroke Patients Only)       Balance Overall balance assessment: Needs assistance, History of Falls Sitting-balance support: No upper extremity supported, Feet supported Sitting balance-Leahy Scale: Fair     Standing balance support: Bilateral upper extremity supported Standing balance-Leahy Scale: Poor                              Communication Communication Communication: No apparent difficulties  Cognition Arousal: Alert Behavior During Therapy: Flat affect   PT - Cognitive impairments: History of cognitive impairments, Memory  PT - Cognition Comments: per spouse pt has had memory problems since her CVA Following commands: Intact      Cueing Cueing Techniques: Verbal cues, Tactile cues  Exercises      General Comments General comments (skin integrity, edema, etc.): Husband present      Pertinent Vitals/Pain Pain  Assessment Pain Assessment: Faces Faces Pain Scale: Hurts even more Pain Location: all over Pain Descriptors / Indicators: Headache, Discomfort, Guarding Pain Intervention(s): Limited activity within patient's tolerance, Monitored during session, Repositioned    Home Living                          Prior Function            PT Goals (current goals can now be found in the care plan section) Acute Rehab PT Goals Patient Stated Goal: feel better PT Goal Formulation: With patient/family Time For Goal Achievement: 07/13/24 Potential to Achieve Goals: Good Progress towards PT goals: Progressing toward goals    Frequency    Min 2X/week      PT Plan      Co-evaluation              AM-PAC PT 6 Clicks Mobility   Outcome Measure  Help needed turning from your back to your side while in a flat bed without using bedrails?: A Little Help needed moving from lying on your back to sitting on the side of a flat bed without using bedrails?: A Little Help needed moving to and from a bed to a chair (including a wheelchair)?: A Little Help needed standing up from a chair using your arms (e.g., wheelchair or bedside chair)?: A Little Help needed to walk in hospital room?: Total Help needed climbing 3-5 steps with a railing? : Total 6 Click Score: 14    End of Session Equipment Utilized During Treatment: Gait belt;Oxygen  Activity Tolerance: Treatment limited secondary to medical complications (Comment) (incontinence; dizziness) Patient left: in chair;with call bell/phone within reach;with chair alarm set;with family/visitor present Nurse Communication: Mobility status PT Visit Diagnosis: Unsteadiness on feet (R26.81);History of falling (Z91.81);Pain Pain - Right/Left: Left Pain - part of body:  (eye/head)     Time: 8848-8753 PT Time Calculation (min) (ACUTE ONLY): 55 min  Charges:    $Gait Training: 23-37 mins $Therapeutic Activity: 23-37 mins PT General  Charges $$ ACUTE PT VISIT: 1 Visit                      [name], PT Acute Rehabilitation Services  Office [phone]    [name] 06/30/2024, 1:06 PM

## 2024-06-30 NOTE — Progress Notes (Signed)
 Progress Note   Patient: [name] FMW:[id number] DOB: [birth year] DOA: 06/28/2024     0 DOS: the patient was seen and examined on 06/30/2024   Brief hospital course: [name] is a 78 y.o. female with PMH of HFpEF, A-fib/flutter on warfarin, rheumatic heart disease, AV and MV replacement, COPD, CVA, carotid artery stenosis, CKD-3B, NIDDM-2, hypothyroidism, lumbar laminectomy and decompression and mild cognitive impairment brought to ED by EMS after syncopal episode.   Patient sustained facial injury, CT head cervical and maxillofacial showed 7 mm focus of acute IPH over the left frontal white matter, minimal acute SAH over the distal right frontal lobe, depressed comminuted left orbital floor fracture with bilateral entrapment of inferior rectus muscle, fracture of medial wall of the left orbit without entrapment of intraorbital contents, minimally displaced nasal bone fracture as well as fracture of junction of left nasal bone to the frontal process of left maxilla. Neurosurgery consulted recommended to hold anticoagulation, syncope workup.  ENT and ophthalmology evaluated the patient.  Admitted to TRH service for further management and syncope evaluation.  Assessment and Plan: Syncope and collapse- Patient with history of HFpEF, A-fib, flutter, valvular replacement, carotid artery stenosis. Continue to monitor closely in progressive care unit. Continue telemetry monitoring. Echocardiogram  EP follow up appreciated, possible ILR placement.  Check orthostatic vitals.  Cardiology consultation appreciated. Continue to hold beta-blocker.  Avoid QT prolonging drugs.  Head injury Orbital floor fracture, nasal bone fracture- Seen by ENT Dr. Tobie, repaired laceration injuries. Continue IV Unasyn  therapy. Encourage out of bed, PT.  Acute intra parenchymal hemorrhage- Repeat MRI reviewed by neurosurgery, stable. No surgical intervention.  Continue to hold warfarin. Cervical spine MRI  unremarkable.  Advised PT OT evaluation. Advance diet as tolerated. Will dc cervical collar.  Hypokalemia- IV Kcl 10meq x 4 ordered. Increase oral kcl to 40 bid. Will check electrolytes and replete accordingly.  Chronic HFpEF: No exacerbation. Continue torsemide , Farxiga , losartan .  Paroxysmal A-fib/flutter First-degree AV block Continue amiodarone . Hold Coumadin  therapy. Beta-blocker on hold due to.  AV block. Will follow cardiology recommendations.  Essential hypertension: Patient will be continued on losartan , torsemide , Farxiga .  CKD stage IIIb: Baseline creatinine around 1.7. Avoid nephrotoxic drugs.  Hypothyroidism: Home dose Synthroid  resumed.  GERD-continue PPI       Out of bed to chair. Incentive spirometry. Nursing supportive care. Fall, aspiration precautions. Diet:  Diet Orders (From admission, onward)     Start     Ordered   06/29/24 1858  DIET SOFT Room service appropriate? Yes; Fluid consistency: Thin  Diet effective now       Question Answer Comment  Room service appropriate? Yes   Fluid consistency: Thin      06/29/24 1857           DVT prophylaxis: SCDs Start: 06/28/24 1633  Level of care: Progressive   Code Status: Full Code  Subjective: Patient is seen and examined today morning. Has pain over her face injuries did not get out of bed.  Eating fair.  Spouse at bedside.  Physical Exam: Vitals:   06/29/24 2317 06/30/24 0356 06/30/24 0736 06/30/24 1253  BP: (!) 119/58 (!) 144/54 (!) 150/54 (!) 112/47  Pulse: (!) 59 61 (!) 59 64  Resp: 13  14 16   Temp: 98.4 F (36.9 C) 98.5 F (36.9 C) 98.8 F (37.1 C) 98.8 F (37.1 C)  TempSrc: Oral Oral Oral Oral  SpO2: 99% 98% 99% 98%  Weight:      Height:  General - Elderly ill looking Caucasian female, distress due to pain HEENT - PERRLA, EOMI, lacerations of her face, nasal deviation, neck collar on Lung - Clear, basal rales, rhonchi, wheezes. Heart - S1, S2 heard, no  murmurs, rubs, trace pedal edema. Abdomen - Soft, non tender, bowel sounds good Neuro - Alert, awake and oriented, non focal exam. Skin - Warm and dry.  Data Reviewed:      Latest Ref Rng & Units 06/30/2024    5:58 AM 06/29/2024    6:17 AM 06/28/2024   12:22 PM  CBC  WBC 4.0 - 10.5 K/uL 7.0  6.5    Hemoglobin 12.0 - 15.0 g/dL 88.6  89.0  86.0   Hematocrit 36.0 - 46.0 % 37.6  35.7  41.0   Platelets 150 - 400 K/uL 157  164        Latest Ref Rng & Units 06/30/2024    5:58 AM 06/29/2024    6:17 AM 06/28/2024   12:22 PM  BMP  Glucose 70 - 99 mg/dL 86  95  877   BUN 8 - 23 mg/dL 14  15  22    Creatinine 0.44 - 1.00 mg/dL 8.56  8.43  8.09   Sodium 135 - 145 mmol/L 140  143  137   Potassium 3.5 - 5.1 mmol/L 2.8  3.1  3.9   Chloride 98 - 111 mmol/L 98  99  99   CO2 22 - 32 mmol/L 27  28    Calcium  8.9 - 10.3 mg/dL 7.8  8.2     ECHOCARDIOGRAM COMPLETE Result Date: 06/29/2024    ECHOCARDIOGRAM REPORT   Patient Name:   [name] Date of Exam: 06/29/2024 Medical Rec #:  [id number]       Height:       63.0 in Accession #:    [id number]      Weight:       150.0 lb Date of Birth:  1946-04-28       BSA:          1.711 m Patient Age:    78 years        BP:           110/48 mmHg Patient Gender: F               HR:           56 bpm. Exam Location:  Inpatient Procedure: 2D Echo, Cardiac Doppler and Color Doppler (Both Spectral and Color            Flow Doppler were utilized during procedure). Indications:    Syncope R55  History:        Patient has prior history of Echocardiogram examinations, most                 recent 12/19/2023. Stroke and COPD, Arrythmias:Atrial                 Fibrillation and Atrial Flutter, Signs/Symptoms:Chest Pain and                 Syncope; Risk Factors:Hypertension, Diabetes and Dyslipidemia.                 Aortic Valve: 21 mm Edwards valve is present in the aortic                 position. Procedure Date: 07/18/2013.                 Mitral Valve: 25  mm Edwards bioprosthetic  valve valve is present                 in the mitral position. Procedure Date: 07/18/2013.  Sonographer:    Thea Norlander RCS Referring Phys: MIGNON ONEIDA BUMP IMPRESSIONS  1. S/P MVR with mean gradient 8 mmHg and no MR; s/p AVR with mean gradient 13 mmHg and no AI.  2. Left ventricular ejection fraction, by estimation, is 70 to 75%. The left ventricle has hyperdynamic function. The left ventricle has no regional wall motion abnormalities. Left ventricular diastolic parameters are indeterminate. Elevated left atrial  pressure.  3. Right ventricular systolic function is moderately reduced. The right ventricular size is mildly enlarged.  4. Left atrial size was severely dilated.  5. Right atrial size was mildly dilated.  6. The mitral valve has been repaired/replaced. No evidence of mitral valve regurgitation. Mild mitral stenosis. There is a 25 mm Edwards bioprosthetic valve present in the mitral position. Procedure Date: 07/18/2013.  7. The aortic valve has been repaired/replaced. Aortic valve regurgitation is not visualized. No aortic stenosis is present. There is a 21 mm Edwards valve present in the aortic position. Procedure Date: 07/18/2013.  8. The inferior vena cava is normal in size with greater than 50% respiratory variability, suggesting right atrial pressure of 3 mmHg. FINDINGS  Left Ventricle: Left ventricular ejection fraction, by estimation, is 70 to 75%. The left ventricle has hyperdynamic function. The left ventricle has no regional wall motion abnormalities. The left ventricular internal cavity size was normal in size. There is no left ventricular hypertrophy. Left ventricular diastolic parameters are indeterminate. Elevated left atrial pressure. Right Ventricle: The right ventricular size is mildly enlarged. Right ventricular systolic function is moderately reduced. Left Atrium: Left atrial size was severely dilated. Right Atrium: Right atrial size was mildly dilated. Pericardium: Trivial pericardial  effusion is present. Mitral Valve: The mitral valve has been repaired/replaced. No evidence of mitral valve regurgitation. There is a 25 mm Edwards bioprosthetic valve present in the mitral position. Procedure Date: 07/18/2013. Mild mitral valve stenosis. MV peak gradient, 25.9 mmHg. The mean mitral valve gradient is 8.0 mmHg. Tricuspid Valve: The tricuspid valve is normal in structure. Tricuspid valve regurgitation is trivial. No evidence of tricuspid stenosis. Aortic Valve: The aortic valve has been repaired/replaced. Aortic valve regurgitation is not visualized. No aortic stenosis is present. Aortic valve mean gradient measures 12.5 mmHg. Aortic valve peak gradient measures 24.4 mmHg. Aortic valve area, by VTI measures 1.55 cm. There is a 21 mm Edwards valve present in the aortic position. Procedure Date: 07/18/2013. Pulmonic Valve: The pulmonic valve was normal in structure. Pulmonic valve regurgitation is trivial. No evidence of pulmonic stenosis. Aorta: The aortic root is normal in size and structure. Venous: The inferior vena cava is normal in size with greater than 50% respiratory variability, suggesting right atrial pressure of 3 mmHg. IAS/Shunts: No atrial level shunt detected by color flow Doppler. Additional Comments: S/P MVR with mean gradient 8 mmHg and no MR; s/p AVR with mean gradient 13 mmHg and no AI.  LEFT VENTRICLE PLAX 2D LVIDd:         3.80 cm   Diastology LVIDs:         2.40 cm   LV e' medial:   6.42 cm/s LV PW:         1.10 cm   LV E/e' medial: 29.8 LV IVS:        0.70 cm LVOT diam:  2.10 cm LV SV:         88 LV SV Index:   52 LVOT Area:     3.46 cm  RIGHT VENTRICLE            IVC RV S prime:     9.73 cm/s  IVC diam: 2.20 cm TAPSE (M-mode): 1.0 cm LEFT ATRIUM             Index        RIGHT ATRIUM           Index LA diam:        3.30 cm 1.93 cm/m   RA Area:     18.20 cm LA Vol (A2C):   59.6 ml 34.86 ml/m  RA Volume:   49.10 ml  28.69 ml/m LA Vol (A4C):   85.6 ml 50.03 ml/m LA Biplane  Vol: 75.7 ml 44.24 ml/m  AORTIC VALVE AV Area (Vmax):    1.65 cm AV Area (Vmean):   1.67 cm AV Area (VTI):     1.55 cm AV Vmax:           247.00 cm/s AV Vmean:          160.500 cm/s AV VTI:            0.568 m AV Peak Grad:      24.4 mmHg AV Mean Grad:      12.5 mmHg LVOT Vmax:         118.00 cm/s LVOT Vmean:        77.400 cm/s LVOT VTI:          0.255 m LVOT/AV VTI ratio: 0.45  AORTA Ao Root diam: 2.70 cm Ao Asc diam:  3.10 cm MITRAL VALVE MV Area (PHT): 1.48 cm     SHUNTS MV Area VTI:   1.02 cm     Systemic VTI:  0.26 m MV Peak grad:  25.9 mmHg    Systemic Diam: 2.10 cm MV Mean grad:  8.0 mmHg MV Vmax:       2.55 m/s MV Vmean:      128.3 cm/s MV Decel Time: 511 msec MV E velocity: 191.00 cm/s MV A velocity: 84.80 cm/s MV E/A ratio:  2.25 Redell Shallow MD Electronically signed by Redell Shallow MD Signature Date/Time: 06/29/2024/12:53:19 PM    Final    MR Cervical Spine Wo Contrast Result Date: 06/28/2024 CLINICAL DATA:  Initial evaluation for acute ataxia. EXAM: MRI CERVICAL SPINE WITHOUT CONTRAST TECHNIQUE: Multiplanar, multisequence MR imaging of the cervical spine was performed. No intravenous contrast was administered. COMPARISON:  CT from earlier the same day. FINDINGS: Alignment: Straightening with slight reversal of the normal cervical lordosis. No significant listhesis. Vertebrae: Mild chronic height loss with endplate Schmorl's node deformity noted at the superior endplate of T2. Vertebral body height otherwise maintained without acute or recent fracture. Bone marrow signal intensity within normal limits. No worrisome osseous lesions. No abnormal marrow edema. Cord: Normal signal and morphology. Posterior Fossa, vertebral arteries, paraspinal tissues: Patchy signal abnormality within the pons, most characteristic of chronic micro ischemic disease. Few small remote left cerebellar infarcts. Craniocervical junction normal. Paraspinous soft tissues within normal limits. Normal flow voids seen within  the vertebral arteries bilaterally. Disc levels: C2-C3: Negative interspace. Mild to moderate left greater than right facet hypertrophy. No stenosis. C3-C4: Central disc protrusion indents the ventral thecal sac. Moderate right with mild left facet hypertrophy. No spinal stenosis. Foramina remain patent. C4-C5: Left paracentral disc protrusion indents the ventral thecal  sac. Minimal cord flattening without cord signal changes. Mild spinal stenosis. Foramina remain patent. C5-C6: Degenerative disc space narrowing with diffuse disc osteophyte complex. Flattening and partial effacement of the ventral thecal sac, asymmetric to the right. Mild spinal stenosis. Severe left with moderate right C6 foraminal narrowing. C6-C7: Degenerative intervertebral disc space narrowing with diffuse disc osteophyte complex. Flattening of the ventral thecal sac with no more than mild spinal stenosis. Moderate left with mild to moderate right C7 foraminal narrowing. C7-T1: Negative interspace. Mild left greater than right facet hypertrophy. No stenosis. IMPRESSION: 1. Normal MRI appearance of the cervical spinal cord. 2. Multilevel cervical spondylosis with resultant mild diffuse spinal stenosis at C4-5 through C6-7. 3. Multifactorial degenerative changes with resultant multilevel foraminal narrowing as above. Notable findings include severe left with moderate right C6 foraminal stenosis, with moderate left and mild to moderate right C7 foraminal narrowing. Electronically Signed   By: Morene Hoard M.D.   On: 06/28/2024 21:23   MR BRAIN WO CONTRAST Result Date: 06/28/2024 CLINICAL DATA:  Initial evaluation for acute neuro deficit, stroke suspected. EXAM: MRI HEAD WITHOUT CONTRAST TECHNIQUE: Multiplanar, multiecho pulse sequences of the brain and surrounding structures were obtained without intravenous contrast. COMPARISON:  CT from earlier the same day. FINDINGS: Brain: Examination degraded by motion. Generalized age-related  cerebral atrophy. Patchy T2/FLAIR hyperintensity about the periventricular and deep white matter as well as the pons, consistent with chronic small vessel ischemic disease, moderate in nature. Encephalomalacia and gliosis involving the subcortical right frontal lobe, consistent with a chronic right ACA distribution infarct. Multiple small remote bilateral cerebellar infarcts, greater on the left. Small remote lacunar infarct noted within the left thalamus. No evidence for acute or subacute ischemia. Gray-white matter differentiation maintained. 1.2 cm acute intraparenchymal hemorrhage present at the subcortical left frontal lobe, corresponding with abnormality on prior CT, likely a small hemorrhagic contusion. Trace acute intraventricular hemorrhage seen layering within the occipital horns of both lateral ventricles. No other acute intracranial hemorrhage. Chronic hemosiderin staining noted at the parafalcine right frontal lobe related to the adjacent right ACA infarct. No other significant chronic intracranial blood products. No mass lesion or midline shift. Ventricular prominence related global parenchymal volume loss of hydrocephalus. No extra-axial fluid collection. Pituitary gland within normal limits. Vascular: Major intracranial vascular flow voids are grossly maintained at the skull base. Skull and upper cervical spine: Cranial junction within normal limits. Bone marrow signal intensity normal. Soft tissue contusion present at the left forehead/periorbital region. Sinuses/Orbits: Acute left orbital floor fracture with scattered blood products within the left maxillary sinus, better evaluated on prior maxillofacial CT. No mastoid effusion. Other: None. IMPRESSION: 1. 1.2 cm acute intraparenchymal hemorrhage at the subcortical left frontal lobe, corresponding with abnormality on prior CT, likely a small hemorrhagic contusion. 2. Trace acute intraventricular hemorrhage layering within the occipital horns of  both lateral ventricles. No hydrocephalus. 3. No other acute intracranial abnormality. 4. Acute left orbital floor fracture, better evaluated on prior maxillofacial CT. Prominent overlying left periorbital soft tissue contusion. 5. Underlying age-related cerebral atrophy with moderate chronic small vessel ischemic disease, with multiple remote ischemic infarcts as above. Electronically Signed   By: Morene Hoard M.D.   On: 06/28/2024 21:03    Family Communication: Discussed with patient, spouse at bedside, understand and agree. All questions answered.  Disposition: Status is: Observation The patient will require care spanning > 2 midnights and should be moved to inpatient because: Syncope workup, PT OT evaluation, pain control  Planned Discharge Destination: Home with  Home Health     Time spent: 51 minutes  Author: Concepcion Riser, MD 06/30/2024 1:54 PM Secure chat 7am to 7pm For on call review [email or web address].

## 2024-07-01 ENCOUNTER — Encounter (HOSPITAL_COMMUNITY)

## 2024-07-01 DIAGNOSIS — E119 Type 2 diabetes mellitus without complications: Secondary | ICD-10-CM | POA: Diagnosis not present

## 2024-07-01 DIAGNOSIS — S0232XA Fracture of orbital floor, left side, initial encounter for closed fracture: Secondary | ICD-10-CM | POA: Diagnosis not present

## 2024-07-01 DIAGNOSIS — Z794 Long term (current) use of insulin: Secondary | ICD-10-CM

## 2024-07-01 DIAGNOSIS — S06321A Contusion and laceration of left cerebrum with loss of consciousness of 30 minutes or less, initial encounter: Secondary | ICD-10-CM | POA: Diagnosis not present

## 2024-07-01 DIAGNOSIS — N1832 Chronic kidney disease, stage 3b: Secondary | ICD-10-CM | POA: Diagnosis not present

## 2024-07-01 DIAGNOSIS — I5032 Chronic diastolic (congestive) heart failure: Secondary | ICD-10-CM | POA: Diagnosis not present

## 2024-07-01 DIAGNOSIS — S0181XD Laceration without foreign body of other part of head, subsequent encounter: Secondary | ICD-10-CM

## 2024-07-01 DIAGNOSIS — S0993XA Unspecified injury of face, initial encounter: Secondary | ICD-10-CM | POA: Diagnosis not present

## 2024-07-01 LAB — BASIC METABOLIC PANEL WITH GFR
Anion gap: 11 (ref 5–15)
BUN: 18 mg/dL (ref 8–23)
CO2: 27 mmol/L (ref 22–32)
Calcium: 8.3 mg/dL — ABNORMAL LOW (ref 8.9–10.3)
Chloride: 98 mmol/L (ref 98–111)
Creatinine, Ser: 1.56 mg/dL — ABNORMAL HIGH (ref 0.44–1.00)
GFR, Estimated: 33 mL/min — ABNORMAL LOW (ref >60)
Glucose, Bld: 156 mg/dL — ABNORMAL HIGH (ref 70–99)
Potassium: 4.6 mmol/L (ref 3.5–5.1)
Sodium: 136 mmol/L (ref 135–145)

## 2024-07-01 LAB — GLUCOSE, CAPILLARY
Glucose-Capillary: 127 mg/dL — ABNORMAL HIGH (ref 70–99)
Glucose-Capillary: 148 mg/dL — ABNORMAL HIGH (ref 70–99)
Glucose-Capillary: 131 mg/dL — ABNORMAL HIGH (ref 70–99)
Glucose-Capillary: 201 mg/dL — ABNORMAL HIGH (ref 70–99)
Glucose-Capillary: 159 mg/dL — ABNORMAL HIGH (ref 70–99)

## 2024-07-01 MED ORDER — SODIUM CHLORIDE 0.9 % IV SOLN
3.0000 g | Freq: Two times a day (BID) | INTRAVENOUS | Status: AC
  Administered 2024-07-01 – 2024-07-04 (×6): 3 g via INTRAVENOUS
  Filled 2024-07-01 (×6): qty 8

## 2024-07-01 MED ORDER — LEVOTHYROXINE SODIUM 100 MCG PO TABS
100.0000 ug | ORAL_TABLET | Freq: Every day | ORAL | Status: DC
  Administered 2024-07-01 – 2024-07-10 (×10): 100 ug via ORAL
  Filled 2024-07-01 (×9): qty 1

## 2024-07-01 NOTE — Progress Notes (Signed)
 Inpatient Rehab Admissions:  Inpatient Rehab Consult received.  I met with patient at the bedside for rehabilitation assessment and to discuss goals and expectations of an inpatient rehab admission.  Discussed average length of stay, insurance authorization requirement and discharge home after completion of CIR. Pt acknowledged understanding and is interested in pursuing CIR. Pt gave permission to contact her husband Lamar to verify dispo. Spoke with Lamar on the telephone. He told [facility] he would prefer to discuss CIR in person. Will f/u with pt's husband at later date.    Signed: Tinnie Yvone Cohens, MS, CCC-SLP Admissions Coordinator [phone]

## 2024-07-01 NOTE — TOC CM/SW Note (Signed)
  Transition of Care [facility]) Screening Note   Patient Details  Name: [name] Date of Birth: [birth year]   Transition of Care [facility]) CM/SW Contact:    Montie LOISE Louder, LCSW Phone Number: 07/01/2024, 11:10 AM    Transition of Care Department [facility]) has reviewed patient and no TOC needs have been identified at this time. We will continue to monitor patient advancement through interdisciplinary progression rounds. If new patient transition needs arise, please place a TOC consult.   Montie Louder, MSW, LCSW Clinical Social Worker

## 2024-07-01 NOTE — Progress Notes (Cosign Needed Addendum)
 Progress Note  Patient Name: [name] Date of Encounter: 07/01/2024  Primary Cardiologist: Alvan Carrier, MD   Subjective   Pain controlled at present.  Agreeable to loop implant prior to discharge  Inpatient Medications    Scheduled Meds:  amiodarone   200 mg Oral Daily   arformoterol   15 mcg Nebulization BID   atorvastatin   40 mg Oral q AM   budesonide  (PULMICORT ) nebulizer solution  0.5 mg Nebulization BID   dapagliflozin  propanediol  10 mg Oral q AM   escitalopram   10 mg Oral Q breakfast   feeding supplement  237 mL Oral BID BM   Influenza vac split trivalent PF  0.5 mL Intramuscular Tomorrow-1000   insulin  aspart  0-6 Units Subcutaneous Q4H   levothyroxine   100 mcg Oral QAC breakfast   losartan   25 mg Oral q AM   multivitamin with minerals  1 tablet Oral q AM   pantoprazole   40 mg Oral QAC breakfast   potassium chloride  SA  40 mEq Oral BID   revefenacin   175 mcg Nebulization Daily   torsemide   30 mg Oral Daily   trimethobenzamide   200 mg Intramuscular Once   Continuous Infusions:  ampicillin -sulbactam (UNASYN ) IV 3 g (07/01/24 0507)   PRN Meds: acetaminophen  **OR** acetaminophen , HYDROmorphone  (DILAUDID ) injection, ipratropium-albuterol , ondansetron  **OR** ondansetron  (ZOFRAN ) IV, oxyCODONE    Vital Signs    Vitals:   06/30/24 1935 06/30/24 2320 07/01/24 0428 07/01/24 0751  BP: (!) 102/45   118/60  Pulse:    67  Resp:    10  Temp: 99 F (37.2 C) 98.7 F (37.1 C) 98 F (36.7 C) 98.9 F (37.2 C)  TempSrc: Oral Oral Oral Oral  SpO2:    98%  Weight:      Height:        Intake/Output Summary (Last 24 hours) at 07/01/2024 9177 Last data filed at 06/30/2024 1132 Gross per 24 hour  Intake 118 ml  Output 550 ml  Net -432 ml   Filed Weights   06/28/24 1223  Weight: 68 kg    Telemetry    SR 1st degree AVblock, generally 60's, no brady, no arrhythmias,  - Personally Reviewed  ECG    Sinus rhythm, 1st degree AVblock , QTc -  Personally Reviewed  Physical Exam   GEN: No acute distress.   Extensive ecchymosis, swelling face Cardiac: RRR, no murmurs, rubs, or gallops.  Respiratory: CTA b/l GI: Soft, nontender, non-distended  MS: No edema; Face with ecchymosis, edema   Labs    Chemistry Recent Labs  Lab 06/28/24 1219 06/28/24 1222 06/29/24 0617 06/30/24 0558  NA 138 137 143 140  K 3.9 3.9 3.1* 2.8*  CL 99 99 99 98  CO2 25  --  28 27  GLUCOSE 121* 122* 95 86  BUN 16 22 15 14   CREATININE 1.75* 1.90* 1.56* 1.43*  CALCIUM  8.8*  --  8.2* 7.8*  PROT 7.2  --  6.4*  --   ALBUMIN  3.8  --  3.2*  --   AST 55*  --  25  --   ALT 26  --  19  --   ALKPHOS 62  --  58  --   BILITOT 1.4*  --  1.1  --   GFRNONAA 29*  --  33* 37*  ANIONGAP 14  --  16* 15     Hematology Recent Labs  Lab 06/28/24 1219 06/28/24 1222 06/29/24 0617 06/30/24 0558  WBC 8.0  --  6.5 7.0  RBC 4.60  --  4.11 4.26  HGB 12.2 13.9 10.9* 11.3*  HCT 39.9 41.0 35.7* 37.6  MCV 86.7  --  86.9 88.3  MCH 26.5  --  26.5 26.5  MCHC 30.6  --  30.5 30.1  RDW 16.3*  --  16.1* 16.2*  PLT 191  --  164 157    Cardiac EnzymesNo results for input(s): TROPONINI in the last 168 hours. No results for input(s): TROPIPOC in the last 168 hours.   BNPNo results for input(s): BNP, PROBNP in the last 168 hours.   DDimer No results for input(s): DDIMER in the last 168 hours.   Summary of Pertinent studies    TTE: LVEF 70 to 75%.  Mitral valve was replaced.  Mild mitral stenosis with Edwards bioprosthetic valve present.  Bioprosthetic aortic valve with no stenosis or regurgitation.  LVEF is 70 to 75%.  12/19/2023: EF 55%, Gr II diastolic dysfunction.  Mild to moderately dilated left atrium, normal right atrium.  Status post MVR and AVR with bioprosthesis.  Prosthetic valves appear to be functioning normally.    Patient Profile     78 y.o. female with a history of rheumatic valve disease (MS/AI/AS ) status post pericardial MVR & AVR  2014, CHFimpEF, persistent atrial fibrillation/flutter, stroke in 2013 in the setting of left atrial pended thrombus, mild coronary artery disease, obstructive lung disease, vasovagal syncope seen today for evaluation of possible syncope.  Assessment & Plan    Fall -- possible syncope Resulting in intracranial hemorrhage, orbital fractures Prior syncope years ago attributed to vagal syncope in the setting of shoulder pain; more recent fall sounds more orthostatic Prodrome of confusion prior to fall prior to this admit Witnessed (husband) thinks she tripped and fell rather than had loss of consciousness Possible cardiogenic causes would include recurrence of AF with post-conversion pause, TdP due to prolonged QT -- it isn't clear though that she had syncope at all. Plan for loop implant day of discharge    Dr. Nancey had a long discussion with the patient's husband.  He reports the patient had another fall about 2 weeks ago.  She did not lose consciousness, she just became weak and fell backwards.  She does have unsteadiness after standing up and has to stabilize and regain her equilibrium after standing.     S/P pericardial MVR, AVR in 2014 Valve replacement in 2014 due to rheumatic heart disease No severe stenosis on echocardiogram this admission (despite hyperdynamic EF)  Persistent atrial fibrillation and flutter On chronic amiodarone  Anticoagulation chronically with warfarin, currently held Would continue amiodarone  for now. Mainating sinus rhythm will reduce stroke risk while off anticoagulation.  Resume anticoagulation when cleared to do so by neurosurgery team  Secondary hypercoagulable state History of prior cardioembolic stroke On warfarin chronically due to valvular AF Warfarin held, not reversed per neurosurgery Resume when cleared to do so  Prolonged QT On amiodarone  Keep potassium > 4, magnesium  > 2 --aggressively Hypokalemic yesterday > replaced, pending labs this  AM Hold other rate-slowing medications; avoid bradycardia EKG yesterday QTc  Hypertension On valsartan 25, bisoprolol  5 as outpatient Husband reports systolic blood pressures at home typically in the 100s BPs often elevated here --continue valsartan for now Keep OFF betablocker   For questions or updates, please contact [facility] Please consult [email or web address] for contact info under Cardiology/STEMI.      Signed, [name], PA-C 07/01/2024, 8:22 AM

## 2024-07-01 NOTE — Consult Note (Signed)
 Physical Medicine and Rehabilitation Consult Reason for Consult: CVA with functional deficits Referring Physician: Concepcion   HPI: [name] is a 78 y.o. female with history of heart failure with preserved ejection fraction, A-fib flutter on warfarin, rheumatic heart disease, history of aortic valve and mitral valve replacement, COPD, prior CVA, carotid artery stenosis, CKD 3B, NIDDM 2, hypothyroidism who was brought to the ED on 06/28/2024 with syncopal episode.  She fell and hit her face against the concrete floor.  She was unresponsive for 30 seconds.  No seizure activity was noted.  Labs in the ED showed creatinine at baseline of 1.75 mild elevation of liver function test, CBC essentially normal, lactic acid 1.9, troponins negative, INR mildly elevated above therapeutic at 3.4.  CT did show 7 mm focus of intraparenchymal hemorrhage over the left frontal white matter.  There was a minimal acute subarachnoid hemorrhage adjacent to this.  She also had a comminuted left orbital floor fracture with partial entrapment of inferior rectus muscle.  There is also a fracture of the medial border left orbit minimally displaced nasal bone fracture as well as left maxillary fracture.  MRI of the cervical spine was unremarkable patient was evaluated by neurosurgery, not felt to require reversal of anticoagulation but warfarin was held.  Beta-blocker was held.  Seen by cardiology for possible syncopal event.  Seen by internal medicine as well. ENT was consulted, Dr. Tobie repaired laceration injuries.  Started on IV Unasyn .  Physical therapy evaluation, tried patient off of O2 and her sats decreased to 82% while seated.  Patient was placed back on 1.5 L of O2.  Required min assist x 2 for transfers.  Due to low oxygen  saturation levels ambulation was not attempted on initial evaluation with physical therapy. OT eval on 06/29/2024.  Patient reportedly got some assist from husband intermittently over  the last 3 months for basic a ADLs.  Currently requiring min upper body to max lower body assist for basic ADLs visual sensitivity to light and blurred vision also impacting function. Physical therapy session on 06/30/2024, made some progress although her ambulation distance with using a rolling walker and plus two min assist was limited to 12 feet due to some weakness noted by patient.  Used 1 L O2. Treated for hypokalemia on 06/30/2024 with IV KCl x 4 runs Cardiology/EP planning loop recorder placement prior to discharge, no clear-cut evidence of syncope thus far Home: Home Living Family/patient expects to be discharged to:: Private residence Living Arrangements: Spouse/significant other Available Help at Discharge: Family, Available 24 hours/day Type of Home: House Home Access: Stairs to enter [employer] of Steps: 3 Entrance Stairs-Rails: Right, Left Home Layout: Two level, Able to live on main level with bedroom/bathroom Bathroom Shower/Tub: Health visitor: Handicapped height Bathroom Accessibility: Yes Home Equipment: Shower seat - built in, [employer] - tub/shower (3 point cane)  Functional History: Prior Function Prior Level of Function : Needs assist, History of Falls (last six months) (3 in 6 months) Mobility Comments: using 3 point cane ADLs Comments: occasional assist but don't think she really needed help Functional Status:  Mobility: Bed Mobility Overal bed mobility: Needs Assistance Bed Mobility: Rolling, Sidelying to Sit Rolling: Contact guard assist Sidelying to sit: Min assist General bed mobility comments: roll to rt, no rail; slight incr time and guarding assist to get her balance Transfers Overall transfer level: Needs assistance Equipment used: Rolling walker (2 wheels) Transfers: Sit to/from Stand Sit to Stand:  Min assist Bed to/from chair/wheelchair/BSC transfer type:: Step pivot Step pivot transfers: Min assist, +2  safety/equipment General transfer comment: x 3 reps with vc needed each time for hand placement with RW Ambulation/Gait Ambulation/Gait assistance: Min assist, +2 safety/equipment (chair follow) Gait Distance (Feet): 5 Feet (incontinent urine; cleaned; 12 dizzy) Assistive device: Rolling walker (2 wheels) Gait Pattern/deviations: Step-through pattern, Decreased stride length, Shuffle General Gait Details: assist to steer/maneuver RW; after 12 ft pt began to sway ant/post and reported she was dizzy; sat in chair with seated BP 112/47. Pt reported dizziness improving with sitting Gait velocity interpretation: <1.8 ft/sec, indicate of risk for recurrent falls    ADL: ADL Overall ADL's : Needs assistance/impaired Eating/Feeding: Sitting, Minimal assistance Grooming: Minimal assistance, Sitting Upper Body Bathing: Minimal assistance, Sitting Lower Body Bathing: Maximal assistance, Sit to/from stand Upper Body Dressing : Minimal assistance, Sitting Lower Body Dressing: Maximal assistance, Sit to/from stand Lower Body Dressing Details (indicate cue type and reason): unable to perform figure 4 Toilet Transfer: Minimal assistance, +2 for safety/equipment  Cognition: Cognition Orientation Level: Oriented to person, Oriented to place, Oriented to situation, Disoriented to time Cognition Arousal: Alert Behavior During Therapy: Flat affect   Review of Systems  Constitutional:  Positive for malaise/fatigue. Negative for chills.  HENT:  Positive for sinus pain. Negative for hearing loss.   Eyes:  Positive for photophobia and redness.       Left greater than right periorbital edema, ecchymosis periorbital bilaterally Due to edema has moderate ptosis left lid  Respiratory:  Negative for sputum production, shortness of breath and wheezing.   Cardiovascular:  Negative for chest pain and leg swelling.  Gastrointestinal:  Negative for constipation, diarrhea, nausea and vomiting.  Genitourinary:   Negative for dysuria.  Musculoskeletal:  Positive for falls and neck pain.  Skin:        Multiple facial ecchymotic areas  Neurological:  Positive for dizziness and weakness. Negative for sensory change, focal weakness and seizures.  Endo/Heme/Allergies:  Bruises/bleeds easily.  Psychiatric/Behavioral:  Negative for substance abuse and suicidal ideas.    Past Medical History:  Diagnosis Date   Anemia    Asthma 2010   Atrial fibrillation (HCC)    Atrial flutter (HCC)    Breast carcinoma (HCC) 1995   1995   Cardioembolic stroke (HCC) 01/2012   Right frontal in 01/2012; normal carotid ultrasound; possible LAA thrombus by TEE; virtual complete neurologic recovery   Carotid artery stenosis 1961   Chronic kidney disease, stage 2, mildly decreased GFR    GFR of approximately 60   Diabetes mellitus without complication (HCC) 5+ years ago   Controlled most of time   Diverticulosis of colon (without mention of hemorrhage) 2012   Dr. Golda   Fasting hyperglycemia    120 fasting   Gastroesophageal reflux disease    Gastroparesis    Heart failure with improved ejection fraction (HFimpEF) (HCC) 5+ years   Heart murmur 1961   Hemorrhoids    History of aortic valve replacement with bioprosthetic valve    History of mitral valve replacement with bioprosthetic valve    Hyperlipidemia    Hypertension    pt denies 05/30/13     Dr Alvan chester   Hyponatremia    Hypothyroidism    Rheumatic heart disease    Shortness of breath    Syncope and collapse February, 2024   Past Surgical History:  Procedure Laterality Date   AORTIC VALVE REPLACEMENT N/A 07/18/2013   Procedure: AORTIC VALVE REPLACEMENT (  AVR);  Surgeon: Dorise MARLA Fellers, MD;  Location: [facility] OR;  Service: Open Heart Surgery;  Laterality: N/A;   BACK SURGERY     BREAST LUMPECTOMY Right 10/03/1993   CARDIAC CATHETERIZATION  5+ years   CARDIOVERSION N/A 07/26/2013   Procedure: CARDIOVERSION;  Surgeon: Vina LULLA Gull, MD;  Location: [facility]  ENDOSCOPY;  Service: Cardiovascular;  Laterality: N/A;   CARDIOVERSION N/A 01/04/2023   Procedure: CARDIOVERSION;  Surgeon: Hobart Powell BRAVO, MD;  Location: [facility] ENDOSCOPY;  Service: Cardiovascular;  Laterality: N/A;   CARDIOVERSION N/A 02/23/2024   Procedure: CARDIOVERSION;  Surgeon: [name], MD;  Location: [facility] ORS;  Service: Endoscopy;  Laterality: N/A;   COLONOSCOPY  10/03/2010   Negative screening procedure   DILATION AND CURETTAGE OF UTERUS     ESOPHAGEAL MANOMETRY N/A 06/17/2013   Procedure: ESOPHAGEAL MANOMETRY (EM);  Surgeon: Alm JONELLE Gander, MD;  Location: [facility] ENDOSCOPY;  Service: Endoscopy;  Laterality: N/A;   INTRAOPERATIVE TRANSESOPHAGEAL ECHOCARDIOGRAM N/A 07/18/2013   Procedure: INTRAOPERATIVE TRANSESOPHAGEAL ECHOCARDIOGRAM;  Surgeon: Dorise MARLA Fellers, MD;  Location: [facility] OR;  Service: Open Heart Surgery;  Laterality: N/A;   LEFT AND RIGHT HEART CATHETERIZATION WITH CORONARY ANGIOGRAM N/A 07/10/2013   Procedure: LEFT AND RIGHT HEART CATHETERIZATION WITH CORONARY ANGIOGRAM;  Surgeon: Lonni JONETTA Cash, MD;  Location: [facility] CATH LAB;  Service: Cardiovascular;  Laterality: N/A;   LUMBAR LAMINECTOMY/DECOMPRESSION MICRODISCECTOMY Right 05/13/2014   Procedure: LUMBAR LAMINECTOMY/DECOMPRESSION MICRODISCECTOMY 1 LEVEL  lumbar four/five;  Surgeon: Darina MALVA Boehringer, MD;  Location: [facility] NEURO ORS;  Service: Neurosurgery;  Laterality: Right;   MITRAL VALVE REPLACEMENT N/A 07/18/2013   Procedure: MITRAL VALVE (MV) REPLACEMENT;  Surgeon: Dorise MARLA Fellers, MD;  Location: [facility] OR;  Service: Open Heart Surgery;  Laterality: N/A;   MITRAL VALVE SURGERY  10/03/1960   [facility], closed mitral valvulotomy by finger fracture   TEE WITHOUT CARDIOVERSION  01/24/2012   Procedure: TRANSESOPHAGEAL ECHOCARDIOGRAM (TEE);  Surgeon: Redell GORMAN Shallow, MD;  Location: [facility] ENDOSCOPY;  Service: Cardiovascular;  Laterality: N/A;   TEE WITHOUT CARDIOVERSION N/A 07/11/2013   Procedure: TRANSESOPHAGEAL ECHOCARDIOGRAM  (TEE);  Surgeon: Aleene JINNY Passe, MD;  Location: [facility] ENDOSCOPY;  Service: Cardiovascular;  Laterality: N/A;   TEE WITHOUT CARDIOVERSION N/A 07/26/2013   Procedure: TRANSESOPHAGEAL ECHOCARDIOGRAM (TEE);  Surgeon: Vina LULLA Gull, MD;  Location: [facility] ENDOSCOPY;  Service: Cardiovascular;  Laterality: N/A;   TUBAL LIGATION  10/04/1971   Family History  Problem Relation Age of Onset   Heart disease Brother 44       MI   Rheum arthritis Maternal Grandmother    Asthma Maternal Grandfather    Hypothyroidism Mother    Diabetes Sister    Cirrhosis Father    Social History:  reports that she has never smoked. She has never used smokeless tobacco. She reports that she does not drink alcohol and does not use drugs. Allergies: No Known Allergies Medications Prior to Admission  Medication Sig Dispense Refill   acetaminophen  (TYLENOL ) 325 MG tablet Take 2 tablets (650 mg total) by mouth every 6 (six) hours as needed. 36 tablet 0   albuterol  (VENTOLIN  HFA) 108 (90 Base) MCG/ACT inhaler Inhale 2 puffs into the lungs every 6 (six) hours as needed for wheezing or shortness of breath.     amiodarone  (PACERONE ) 200 MG tablet Take 1 tablet (200 mg total) by mouth daily. (Patient taking differently: Take 200 mg by mouth in the morning.) 30 tablet 6   atorvastatin  (LIPITOR) 40 MG tablet TAKE ONE TABLET BY MOUTH ONCE  DAILY. (Patient taking differently: Take 40 mg by mouth in the morning.) 90 tablet 3   azelastine  (ASTELIN ) 0.1 % nasal spray Place 1 spray into both nostrils 2 (two) times daily. Use in each nostril as directed 30 mL 1   bisoprolol  (ZEBETA ) 5 MG tablet Take 1 tablet (5 mg total) by mouth daily. (Patient taking differently: Take 5 mg by mouth in the morning.) 90 tablet 3   budesonide -formoterol  (SYMBICORT ) 80-4.5 MCG/ACT inhaler One puff first thing in am and 12 hours later (Patient taking differently: One puff first thing in am and 12 hours later. Use 1 week then swap to Trelegy for a week.) 1 each 12    calcium  carbonate (TUMS - DOSED IN MG ELEMENTAL CALCIUM ) 500 MG chewable tablet Chew 2-3 tablets by mouth as needed for indigestion or heartburn.     dextromethorphan  15 MG/5ML syrup Take 10 mLs by mouth as needed for cough.     escitalopram  (LEXAPRO ) 10 MG tablet Take 10 mg by mouth daily with breakfast.     famotidine  (PEPCID ) 20 MG tablet TAKE ONE TABLET BY MOUTH EVERY DAY AFTER SUPPER 30 tablet 0   FARXIGA  10 MG TABS tablet Take 10 mg by mouth in the morning.     fexofenadine (ALLEGRA) 180 MG tablet Take 180 mg by mouth in the morning.     fluticasone  (FLONASE ) 50 MCG/ACT nasal spray Place 1 spray into both nostrils in the morning.     levothyroxine  (SYNTHROID ) 100 MCG tablet Take 100 mcg by mouth daily before breakfast.     losartan  (COZAAR ) 25 MG tablet Take 25 mg by mouth in the morning.     metFORMIN  (GLUCOPHAGE ) 500 MG tablet Take 1 tablet (500 mg total) by mouth 2 (two) times daily with a meal. 60 tablet 1   Multiple Vitamin (MULTIVITAMIN WITH MINERALS) TABS tablet Take 1 tablet by mouth in the morning.     pantoprazole  (PROTONIX ) 40 MG tablet TAKE 1 TABLET 30 TO 60 MINUTES BEFORE FIRST MEAL OF THE DAY. (Patient taking differently: Take 40 mg by mouth daily before breakfast.) 90 tablet 2   Polyethyl Glycol-Propyl Glycol (SYSTANE OP) Place 1 drop into both eyes daily as needed (dry eyes).     potassium chloride  SA (KLOR-CON  M) 20 MEQ tablet Take 1 tablet (20 mEq total) by mouth daily. (Patient taking differently: Take 20 mEq by mouth in the morning.) 30 tablet 0   torsemide  (DEMADEX ) 20 MG tablet Take 20mg  (1 tab) by mouth every other day alternating with 40mg  (2 tabs) every other day (Patient taking differently: Take 20-40 mg by mouth See admin instructions. Take 20mg  (1 tab) by mouth every other day alternating with 40mg  (2 tabs) every other day.) 135 tablet 1   TRELEGY ELLIPTA  100-62.5-25 MCG/ACT AEPB Inhale 1 puff into the lungs daily.     warfarin (COUMADIN ) 2.5 MG tablet TAKE AS  DIRECTED BY COUMADIN  CLINIC. (Patient taking differently: Take 2.5 mg by mouth See admin instructions. TAKE AS DIRECTED BY COUMADIN  CLINIC. Take 1 tablet by mouth every day, except on Thursday and Sunday take a 1/2 tablet.) 45 tablet 3     Blood pressure (!) 139/59, pulse 70, temperature 98.8 F (37.1 C), resp. rate 17, height 5' 3 (1.6 m), weight 68 kg, SpO2 97%. Physical Exam Vitals and nursing note reviewed.  Constitutional:      Appearance: She is normal weight.  HENT:     Head: Normocephalic and atraumatic.     Nose: No congestion  or rhinorrhea.     Mouth/Throat:     Mouth: Mucous membranes are moist.  Eyes:     Extraocular Movements: Extraocular movements intact.     Comments: Left greater than right eye injection Periorbital edema left greater than right with ecchymosis  Cardiovascular:     Rate and Rhythm: Normal rate and regular rhythm.     Heart sounds: Murmur heard.     Systolic murmur is present with a grade of 4/6.  Pulmonary:     Effort: Pulmonary effort is normal. No respiratory distress.     Breath sounds: Normal breath sounds. No stridor. No rhonchi.  Chest:     Chest wall: No tenderness.  Abdominal:     General: Bowel sounds are normal. There is no distension.     Tenderness: There is no abdominal tenderness. There is no guarding.  Musculoskeletal:     Right lower leg: No edema.     Left lower leg: No edema.  Skin:    General: Skin is warm.     Findings: Bruising present.  Neurological:     Mental Status: She is alert.     Cranial Nerves: No facial asymmetry.     Sensory: Sensation is intact.     Motor: No abnormal muscle tone.     Coordination: Coordination is intact.     Gait: Gait abnormal.     Comments: Oriented to person and time but thought she was at [facility] Negative dysarthria but because of jaw pain has difficulty opening her mouth Sensation intact to light touch bilateral lower limbs Coordination intact    Motor strength is 4+  bilateral deltoid bicep tricep grip as well as hip flexors knee extensors ankle dorsiflexors.  Results for orders placed or performed during the hospital encounter of 06/28/24 (from the past 24 hours)  Glucose, capillary     Status: Abnormal   Collection Time: 06/30/24 12:51 PM  Result Value Ref Range   Glucose-Capillary 152 (H) 70 - 99 mg/dL  Glucose, capillary     Status: Abnormal   Collection Time: 06/30/24  3:40 PM  Result Value Ref Range   Glucose-Capillary 127 (H) 70 - 99 mg/dL  Glucose, capillary     Status: Abnormal   Collection Time: 06/30/24 11:19 PM  Result Value Ref Range   Glucose-Capillary 126 (H) 70 - 99 mg/dL  Glucose, capillary     Status: Abnormal   Collection Time: 07/01/24  4:30 AM  Result Value Ref Range   Glucose-Capillary 127 (H) 70 - 99 mg/dL  Glucose, capillary     Status: Abnormal   Collection Time: 07/01/24  8:04 AM  Result Value Ref Range   Glucose-Capillary 148 (H) 70 - 99 mg/dL  Basic metabolic panel     Status: Abnormal   Collection Time: 07/01/24  8:25 AM  Result Value Ref Range   Sodium 136 135 - 145 mmol/L   Potassium 4.6 3.5 - 5.1 mmol/L   Chloride 98 98 - 111 mmol/L   CO2 27 22 - 32 mmol/L   Glucose, Bld 156 (H) 70 - 99 mg/dL   BUN 18 8 - 23 mg/dL   Creatinine, Ser 8.43 (H) 0.44 - 1.00 mg/dL   Calcium  8.3 (L) 8.9 - 10.3 mg/dL   GFR, Estimated 33 (L) >60 mL/min   Anion gap 11 5 - 15  Glucose, capillary     Status: Abnormal   Collection Time: 07/01/24 11:28 AM  Result Value Ref Range   Glucose-Capillary  131 (H) 70 - 99 mg/dL   No results found.  Assessment/Plan: Diagnosis: Left frontal IPH Does the need for close, 24 hr/day medical supervision in concert with the patient's rehab needs make it unreasonable for this patient to be served in a less intensive setting? Yes Co-Morbidities requiring supervision/potential complications:  - Multiple facial fractures, CKD 3B, NIDDM, COPD, heart failure with preserved ejection fraction, chronic  atrial fibrillation, prior CVA, cognitive deficits poststroke with decreased orientation Due to bladder management, bowel management, safety, skin/wound care, disease management, medication administration, pain management, and patient education, does the patient require 24 hr/day rehab nursing? Yes Does the patient require coordinated care of a physician, rehab nurse, therapy disciplines of PT, OT, speech to address physical and functional deficits in the context of the above medical diagnosis(es)? Yes Addressing deficits in the following areas: balance, endurance, locomotion, strength, transferring, bowel/bladder control, bathing, dressing, feeding, grooming, toileting, cognition, and psychosocial support Can the patient actively participate in an intensive therapy program of at least 3 hrs of therapy per day at least 5 days per week? Yes The potential for patient to make measurable gains while on inpatient rehab is good Anticipated functional outcomes upon discharge from inpatient rehab are supervision  with PT, supervision with OT, supervision with SLP. Estimated rehab length of stay to reach the above functional goals is: 7 to 10 days Anticipated discharge destination: Home Overall Rehab/Functional Prognosis: good  POST ACUTE RECOMMENDATIONS: This patient's condition is appropriate for continued rehabilitative care in the following setting: CIR Patient has agreed to participate in recommended program. Yes Note that insurance prior authorization may be required for reimbursement for recommended care.  Comment: Patient is motivated for inpatient rehabilitation   MEDICAL RECOMMENDATIONS: Discontinue IV Dilaudid , would need pain managed on p.o. oxycodone  for rehabilitation   I have personally performed a face to face diagnostic evaluation of this patient. Additionally, I have examined the patient's medical record including any pertinent labs and radiographic images.    Thanks,  Prentice FORBES Compton, MD 07/01/2024

## 2024-07-01 NOTE — Plan of Care (Signed)
 Problem: Education: Goal: Ability to describe self-care measures that may prevent or decrease complications (Diabetes Survival Skills Education) will improve 07/01/2024 0010 by Marvis Kenneth SAILOR, RN Outcome: Progressing 06/30/2024 2057 by Marvis Kenneth SAILOR, RN Outcome: Progressing Goal: Individualized Educational Video(s) 07/01/2024 0010 by Marvis Kenneth SAILOR, RN Outcome: Progressing 06/30/2024 2057 by Marvis Kenneth SAILOR, RN Outcome: Progressing   Problem: Coping: Goal: Ability to adjust to condition or change in health will improve 07/01/2024 0010 by Marvis Kenneth SAILOR, RN Outcome: Progressing 06/30/2024 2057 by Marvis Kenneth SAILOR, RN Outcome: Progressing   Problem: Fluid Volume: Goal: Ability to maintain a balanced intake and output will improve 07/01/2024 0010 by Marvis Kenneth SAILOR, RN Outcome: Progressing 06/30/2024 2057 by Marvis Kenneth SAILOR, RN Outcome: Progressing   Problem: Health Behavior/Discharge Planning: Goal: Ability to identify and utilize available resources and services will improve 07/01/2024 0010 by Marvis Kenneth SAILOR, RN Outcome: Progressing 06/30/2024 2057 by Marvis Kenneth SAILOR, RN Outcome: Progressing Goal: Ability to manage health-related needs will improve 07/01/2024 0010 by Marvis Kenneth SAILOR, RN Outcome: Progressing 06/30/2024 2057 by Marvis Kenneth SAILOR, RN Outcome: Progressing   Problem: Metabolic: Goal: Ability to maintain appropriate glucose levels will improve 07/01/2024 0010 by Marvis Kenneth SAILOR, RN Outcome: Progressing 06/30/2024 2057 by Marvis Kenneth SAILOR, RN Outcome: Progressing   Problem: Nutritional: Goal: Maintenance of adequate nutrition will improve 07/01/2024 0010 by Marvis Kenneth SAILOR, RN Outcome: Progressing 06/30/2024 2057 by Marvis Kenneth SAILOR, RN Outcome: Progressing Goal: Progress toward achieving an optimal weight will improve 07/01/2024 0010 by Marvis Kenneth SAILOR, RN Outcome: Progressing 06/30/2024 2057 by Marvis Kenneth SAILOR,  RN Outcome: Progressing   Problem: Skin Integrity: Goal: Risk for impaired skin integrity will decrease 07/01/2024 0010 by Marvis Kenneth SAILOR, RN Outcome: Progressing 06/30/2024 2057 by Marvis Kenneth SAILOR, RN Outcome: Progressing   Problem: Tissue Perfusion: Goal: Adequacy of tissue perfusion will improve 07/01/2024 0010 by Marvis Kenneth SAILOR, RN Outcome: Progressing 06/30/2024 2057 by Marvis Kenneth SAILOR, RN Outcome: Progressing   Problem: Education: Goal: Knowledge of General Education information will improve Description: Including pain rating scale, medication(s)/side effects and non-pharmacologic comfort measures 07/01/2024 0010 by Marvis Kenneth SAILOR, RN Outcome: Progressing 06/30/2024 2057 by Marvis Kenneth SAILOR, RN Outcome: Progressing   Problem: Health Behavior/Discharge Planning: Goal: Ability to manage health-related needs will improve 07/01/2024 0010 by Marvis Kenneth SAILOR, RN Outcome: Progressing 06/30/2024 2057 by Marvis Kenneth SAILOR, RN Outcome: Progressing   Problem: Clinical Measurements: Goal: Ability to maintain clinical measurements within normal limits will improve 07/01/2024 0010 by Marvis Kenneth SAILOR, RN Outcome: Progressing 06/30/2024 2057 by Marvis Kenneth SAILOR, RN Outcome: Progressing Goal: Will remain free from infection 07/01/2024 0010 by Marvis Kenneth SAILOR, RN Outcome: Progressing 06/30/2024 2057 by Marvis Kenneth SAILOR, RN Outcome: Progressing Goal: Diagnostic test results will improve 07/01/2024 0010 by Marvis Kenneth SAILOR, RN Outcome: Progressing 06/30/2024 2057 by Marvis Kenneth SAILOR, RN Outcome: Progressing Goal: Respiratory complications will improve 07/01/2024 0010 by Marvis Kenneth SAILOR, RN Outcome: Progressing 06/30/2024 2057 by Marvis Kenneth SAILOR, RN Outcome: Progressing Goal: Cardiovascular complication will be avoided 07/01/2024 0010 by Marvis Kenneth SAILOR, RN Outcome: Progressing 06/30/2024 2057 by Marvis Kenneth SAILOR, RN Outcome: Progressing    Problem: Activity: Goal: Risk for activity intolerance will decrease 07/01/2024 0010 by Marvis Kenneth SAILOR, RN Outcome: Progressing 06/30/2024 2057 by Marvis Kenneth SAILOR, RN Outcome: Progressing   Problem: Nutrition: Goal: Adequate nutrition will be maintained 07/01/2024 0010 by Marvis Kenneth SAILOR, RN Outcome: Progressing 06/30/2024 2057 by Marvis Kenneth SAILOR, RN Outcome: Progressing  Problem: Coping: Goal: Level of anxiety will decrease 07/01/2024 0010 by Marvis Kenneth SAILOR, RN Outcome: Progressing 06/30/2024 2057 by Marvis Kenneth SAILOR, RN Outcome: Progressing   Problem: Elimination: Goal: Will not experience complications related to bowel motility 07/01/2024 0010 by Marvis Kenneth SAILOR, RN Outcome: Progressing 06/30/2024 2057 by Marvis Kenneth SAILOR, RN Outcome: Progressing Goal: Will not experience complications related to urinary retention 07/01/2024 0010 by Marvis Kenneth SAILOR, RN Outcome: Progressing 06/30/2024 2057 by Marvis Kenneth SAILOR, RN Outcome: Progressing   Problem: Pain Managment: Goal: General experience of comfort will improve and/or be controlled 07/01/2024 0010 by Marvis Kenneth SAILOR, RN Outcome: Progressing 06/30/2024 2057 by Marvis Kenneth SAILOR, RN Outcome: Progressing   Problem: Safety: Goal: Ability to remain free from injury will improve 07/01/2024 0010 by Marvis Kenneth SAILOR, RN Outcome: Progressing 06/30/2024 2057 by Marvis Kenneth SAILOR, RN Outcome: Progressing   Problem: Skin Integrity: Goal: Risk for impaired skin integrity will decrease 07/01/2024 0010 by Marvis Kenneth SAILOR, RN Outcome: Progressing 06/30/2024 2057 by Marvis Kenneth SAILOR, RN Outcome: Progressing

## 2024-07-01 NOTE — Progress Notes (Signed)
 Progress Note   Patient: [name] FMW:[id number] DOB: [birth year] DOA: 06/28/2024     1 DOS: the patient was seen and examined on 07/01/2024   Brief hospital course: DREMA EDDINGTON is a 78 y.o. female with PMH of HFpEF, A-fib/flutter on warfarin, rheumatic heart disease, AV and MV replacement, COPD, CVA, carotid artery stenosis, CKD-3B, NIDDM-2, hypothyroidism, lumbar laminectomy and decompression and mild cognitive impairment brought to ED by EMS after syncopal episode.   Patient sustained facial injury, CT head cervical and maxillofacial showed 7 mm focus of acute IPH over the left frontal white matter, minimal acute SAH over the distal right frontal lobe, depressed comminuted left orbital floor fracture with bilateral entrapment of inferior rectus muscle, fracture of medial wall of the left orbit without entrapment of intraorbital contents, minimally displaced nasal bone fracture as well as fracture of junction of left nasal bone to the frontal process of left maxilla. Neurosurgery consulted recommended to hold anticoagulation, syncope workup.  ENT and ophthalmology evaluated the patient.  Admitted to [facility] service for further management and syncope evaluation.  Assessment and Plan: Syncope and collapse- Patient with history of HFpEF, A-fib, flutter, valvular replacement, carotid artery stenosis. Continue to monitor closely in progressive care unit. Continue telemetry monitoring. Echocardiogram  EP follow up appreciated, possible ILR placement prior to discharge.  Continue to hold beta-blocker.  Avoid QT prolonging drugs. Check orthostatic vitals. PT/ OT advised CIR.  Head injury Orbital floor fracture, nasal bone fracture- Seen by ENT Dr. Tobie, repaired laceration injuries. Continue IV Unasyn  therapy. Encourage out of bed, PT.  Acute intra parenchymal hemorrhage- Repeat MRI reviewed by neurosurgery, stable. No surgical intervention.  Continue to hold warfarin. Cervical spine MRI  unremarkable.  Advised PT OT evaluation. Advance diet as tolerated. Will dc cervical collar.  Hypokalemia- K improved with IV and oral supplements. Replete accordingly.  Chronic HFpEF: No exacerbation. Continue torsemide , Farxiga , losartan .  Paroxysmal A-fib/flutter First-degree AV block Continue amiodarone . Hold Coumadin  therapy. Beta-blocker on hold due to.  AV block. ILR prior to discharge.  Essential hypertension: Patient will be continued on losartan , torsemide , Farxiga .  CKD stage IIIb: Baseline creatinine around 1.7. Avoid nephrotoxic drugs.  Hypothyroidism: Home dose Synthroid  resumed.  GERD-continue PPI    Out of bed to chair. Incentive spirometry. Nursing supportive care. Fall, aspiration precautions. Diet:  Diet Orders (From admission, onward)     Start     Ordered   06/29/24 1858  DIET SOFT Room service appropriate? Yes; Fluid consistency: Thin  Diet effective now       Question Answer Comment  Room service appropriate? Yes   Fluid consistency: Thin      06/29/24 1857           DVT prophylaxis: SCDs Start: 06/28/24 1633  Level of care: Progressive   Code Status: Full Code  Subjective: Patient is seen and examined today morning. Pain better. Advised to work with PT. No family at bedside. Off neck collar.  Physical Exam: Vitals:   07/01/24 0428 07/01/24 0751 07/01/24 0846 07/01/24 1049  BP:  118/60  (!) 139/59  Pulse:  67  70  Resp:  10 16 17   Temp: 98 F (36.7 C) 98.9 F (37.2 C)  98.8 F (37.1 C)  TempSrc: Oral Oral    SpO2:  98%  97%  Weight:      Height:        General - Elderly ill looking Caucasian female, distress due to pain HEENT - PERRLA, EOMI, lacerations of  her face, nasal deviation. Lung - Clear, basal rales, rhonchi, wheezes. Heart - S1, S2 heard, no murmurs, rubs, trace pedal edema. Abdomen - Soft, non tender, bowel sounds good Neuro - Alert, awake and oriented, non focal exam. Skin - Warm and dry.  Data  Reviewed:      Latest Ref Rng & Units 06/30/2024    5:58 AM 06/29/2024    6:17 AM 06/28/2024   12:22 PM  CBC  WBC 4.0 - 10.5 K/uL 7.0  6.5    Hemoglobin 12.0 - 15.0 g/dL 88.6  89.0  86.0   Hematocrit 36.0 - 46.0 % 37.6  35.7  41.0   Platelets 150 - 400 K/uL 157  164        Latest Ref Rng & Units 07/01/2024    8:25 AM 06/30/2024    5:58 AM 06/29/2024    6:17 AM  BMP  Glucose 70 - 99 mg/dL 843  86  95   BUN 8 - 23 mg/dL 18  14  15    Creatinine 0.44 - 1.00 mg/dL 8.43  8.56  8.43   Sodium 135 - 145 mmol/L 136  140  143   Potassium 3.5 - 5.1 mmol/L 4.6  2.8  3.1   Chloride 98 - 111 mmol/L 98  98  99   CO2 22 - 32 mmol/L 27  27  28    Calcium  8.9 - 10.3 mg/dL 8.3  7.8  8.2    No results found.   Family Communication: Discussed with patient, she understand and agree. All questions answered.  Disposition: Status is: inpatient because: Syncope workup, pain control, ILR placement, neuro surgery follow up  Planned Discharge Destination: Rehab     Time spent: 43 minutes  Author: Concepcion Riser, MD 07/01/2024 2:37 PM Secure chat 7am to 7pm For on call review [email or web address].

## 2024-07-02 ENCOUNTER — Inpatient Hospital Stay (HOSPITAL_COMMUNITY)

## 2024-07-02 ENCOUNTER — Encounter (HOSPITAL_COMMUNITY): Payer: Self-pay | Admitting: Cardiovascular Disease

## 2024-07-02 ENCOUNTER — Encounter (HOSPITAL_COMMUNITY)
Admission: EM | Disposition: A | Discharge status: Home Health | Payer: Self-pay | Source: Home / Self Care | Attending: Internal Medicine

## 2024-07-02 DIAGNOSIS — S0232XA Fracture of orbital floor, left side, initial encounter for closed fracture: Secondary | ICD-10-CM | POA: Diagnosis not present

## 2024-07-02 DIAGNOSIS — I5032 Chronic diastolic (congestive) heart failure: Secondary | ICD-10-CM | POA: Diagnosis not present

## 2024-07-02 DIAGNOSIS — I10 Essential (primary) hypertension: Secondary | ICD-10-CM | POA: Diagnosis not present

## 2024-07-02 DIAGNOSIS — R55 Syncope and collapse: Secondary | ICD-10-CM

## 2024-07-02 DIAGNOSIS — I4819 Other persistent atrial fibrillation: Secondary | ICD-10-CM | POA: Diagnosis not present

## 2024-07-02 DIAGNOSIS — S06321A Contusion and laceration of left cerebrum with loss of consciousness of 30 minutes or less, initial encounter: Secondary | ICD-10-CM | POA: Diagnosis not present

## 2024-07-02 DIAGNOSIS — S0633AA Contusion and laceration of cerebrum, unspecified, with loss of consciousness status unknown, initial encounter: Secondary | ICD-10-CM | POA: Diagnosis not present

## 2024-07-02 DIAGNOSIS — I4891 Unspecified atrial fibrillation: Secondary | ICD-10-CM

## 2024-07-02 DIAGNOSIS — S0993XA Unspecified injury of face, initial encounter: Secondary | ICD-10-CM | POA: Diagnosis not present

## 2024-07-02 DIAGNOSIS — I4581 Long QT syndrome: Secondary | ICD-10-CM

## 2024-07-02 HISTORY — PX: LOOP RECORDER INSERTION: EP1214

## 2024-07-02 LAB — URINALYSIS, ROUTINE W REFLEX MICROSCOPIC
Bacteria, UA: NONE SEEN
Bilirubin Urine: NEGATIVE
Glucose, UA: >=500 mg/dL — AB
Hgb urine dipstick: NEGATIVE
Ketones, ur: NEGATIVE mg/dL
Leukocytes,Ua: NEGATIVE
Nitrite: NEGATIVE
Protein, ur: NEGATIVE mg/dL
Specific Gravity, Urine: 1.006 (ref 1.005–1.030)
pH: 7 (ref 5.0–8.0)

## 2024-07-02 LAB — GLUCOSE, CAPILLARY
Glucose-Capillary: 131 mg/dL — ABNORMAL HIGH (ref 70–99)
Glucose-Capillary: 132 mg/dL — ABNORMAL HIGH (ref 70–99)
Glucose-Capillary: 133 mg/dL — ABNORMAL HIGH (ref 70–99)
Glucose-Capillary: 149 mg/dL — ABNORMAL HIGH (ref 70–99)
Glucose-Capillary: 202 mg/dL — ABNORMAL HIGH (ref 70–99)
Glucose-Capillary: 232 mg/dL — ABNORMAL HIGH (ref 70–99)
Glucose-Capillary: 266 mg/dL — ABNORMAL HIGH (ref 70–99)

## 2024-07-02 SURGERY — LOOP RECORDER INSERTION

## 2024-07-02 MED ORDER — LIDOCAINE-EPINEPHRINE 1 %-1:100000 IJ SOLN
INTRAMUSCULAR | Status: DC | PRN
  Administered 2024-07-02: 10 mL

## 2024-07-02 MED ORDER — POLYETHYLENE GLYCOL 3350 17 G PO PACK
17.0000 g | PACK | Freq: Every day | ORAL | Status: DC
  Administered 2024-07-02 – 2024-07-06 (×5): 17 g via ORAL
  Filled 2024-07-02 (×5): qty 1

## 2024-07-02 MED ORDER — SENNOSIDES-DOCUSATE SODIUM 8.6-50 MG PO TABS
2.0000 | ORAL_TABLET | Freq: Every day | ORAL | Status: DC
  Administered 2024-07-02: 2 via ORAL
  Filled 2024-07-02 (×2): qty 2

## 2024-07-02 MED ORDER — DOCUSATE SODIUM 100 MG PO CAPS
100.0000 mg | ORAL_CAPSULE | Freq: Two times a day (BID) | ORAL | Status: DC
  Administered 2024-07-02 – 2024-07-06 (×6): 100 mg via ORAL
  Filled 2024-07-02 (×7): qty 1

## 2024-07-02 MED ORDER — LIDOCAINE-EPINEPHRINE 1 %-1:100000 IJ SOLN
INTRAMUSCULAR | Status: AC
  Filled 2024-07-02: qty 1

## 2024-07-02 SURGICAL SUPPLY — 2 items
MONITOR REVEAL LINQ II (Prosthesis & Implant Heart) IMPLANT
PACK LOOP INSERTION (CUSTOM PROCEDURE TRAY) ×1 IMPLANT

## 2024-07-02 NOTE — Discharge Instructions (Signed)
 Care After Your Loop Recorder  You have a Med[employer]onic Loop Recorder   Monitor your cardiac device site for redness, swelling, and drainage. Call the device clinic at [phone] if you experience these symptoms or fever/chills.  If you notice bleeding from your site, hold firm, but gently pressure with two fingers for 5 minutes. Dried blood on the steri-strips when removing the outer bandage is normal.   Keep the large square bandage on your site for 24 hours and then you may remove it yourself. Keep the steri-strips underneath in place.   You may shower after 72 hours / 3 days from your procedure with the steri-strips in place. They will usually fall off on their own, or may be removed after 10 days. Pat dry.   Avoid lotions, ointments, or perfumes over your incision until it is well-healed.  Please do not submerge in water until your site is completely healed.   Your device is MRI compatible.   Remote monitoring is used to monitor your cardiac device from home. This monitoring is scheduled every month by our office. It allows [place] to keep an eye on the function of your device to ensure it is working properly.

## 2024-07-02 NOTE — Progress Notes (Signed)
 Progress Note   Patient: [name] FMW:[id number] DOB: 10-Jan-1946 DOA: 06/28/2024     2 DOS: the patient was seen and examined on 07/02/2024   Brief hospital course: [name] is a 78 y.o. female with PMH of HFpEF, A-fib/flutter on warfarin, rheumatic heart disease, AV and MV replacement, COPD, CVA, carotid artery stenosis, CKD-3B, NIDDM-2, hypothyroidism, lumbar laminectomy and decompression and mild cognitive impairment brought to ED by EMS after syncopal episode.   Patient sustained facial injury, CT head cervical and maxillofacial showed 7 mm focus of acute IPH over the left frontal white matter, minimal acute SAH over the distal right frontal lobe, depressed comminuted left orbital floor fracture with bilateral entrapment of inferior rectus muscle, fracture of medial wall of the left orbit without entrapment of intraorbital contents, minimally displaced nasal bone fracture as well as fracture of junction of left nasal bone to the frontal process of left maxilla. Neurosurgery consulted recommended to hold anticoagulation, syncope workup.  ENT and ophthalmology evaluated the patient.  Admitted to TRH service for further management and syncope evaluation. ENT repaired lacerations. Got unasyn  for 3 days. Cardiology consulted advised ILR which is placed today. Discussed with neurosurgery regarding coumadin , will confirm tomorrow about timing when to start Coumadin .  CIR following.   Assessment and Plan: Syncope and collapse- Patient with history of HFpEF, A-fib, flutter, mitral, aortic valvular replacement, carotid artery stenosis. Continue to monitor closely in progressive care unit. Continue telemetry monitoring. Echocardiogram EF 70 to 75%, no MR, no AI. EP follow up appreciated, ILR placed today. Continue to hold beta-blocker. Avoid QT prolonging drugs. Check orthostatic vitals. PT/ OT, CIR following.  Head injury Orbital floor fracture, nasal bone fracture- Seen by ENT Dr.  Tobie, repaired laceration injuries. Continue IV Unasyn  therapy total 3 days. Encourage out of bed, PT.  Acute intra parenchymal hemorrhage- Repeat MRI reviewed by neurosurgery, stable. No surgical intervention.  Continue to hold warfarin. Cervical spine MRI unremarkable.  Cervical collar removed. Neurosurgery to follow tomorrow regarding coumadin  resumption.  Hypokalemia- K improved with IV and oral supplements. Replete accordingly.  Chronic HFpEF: No exacerbation. Echo reviewed shows right ventricular systolic function is moderately reduced. Continue torsemide , Farxiga , losartan .  Paroxysmal A-fib/flutter First-degree AV block Continue amiodarone . Hold Coumadin  therapy. Beta-blocker on hold due to.  AV block. ILR prior to discharge.  Essential hypertension: Continue losartan , torsemide , Farxiga .  CKD stage IIIb: Baseline creatinine around 1.7. Avoid nephrotoxic drugs.  Hypothyroidism: continue home dose Synthroid  resumed.  GERD-continue PPI    Out of bed to chair. Incentive spirometry. Nursing supportive care. Fall, aspiration precautions. Diet:  Diet Orders (From admission, onward)     Start     Ordered   06/29/24 1858  DIET SOFT Room service appropriate? Yes; Fluid consistency: Thin  Diet effective now       Question Answer Comment  Room service appropriate? Yes   Fluid consistency: Thin      06/29/24 1857           DVT prophylaxis: SCDs Start: 06/28/24 1633  Level of care: Progressive   Code Status: Full Code  Subjective: Patient is seen and examined today morning. Does have pain over face. RN notified about frequency of urine, foul urine. Spouse, son in law at bedside. Working with PT.  Physical Exam: Vitals:   07/02/24 1122 07/02/24 1445 07/02/24 1551 07/02/24 1552  BP: (!) 120/93 (!) 112/46  (!) 108/46  Pulse: 77 74  70  Resp: 19 18  16   Temp: 98.3  F (36.8 C) 98.8 F (37.1 C) 97.7 F (36.5 C) 97.7 F (36.5 C)  TempSrc: Oral Oral  Oral   SpO2: 97% 99%  99%  Weight:      Height:        General - Elderly ill looking Caucasian female, distress due to pain HEENT - PERRLA, EOMI, lacerations over face, nasal deviation. Lung - Clear, basal rales, rhonchi, wheezes. Heart - S1, S2 heard, no murmurs, rubs, trace pedal edema. Abdomen - Soft, non tender, bowel sounds good Neuro - Alert, awake and oriented, non focal exam. Skin - Warm and dry.  Data Reviewed:      Latest Ref Rng & Units 06/30/2024    5:58 AM 06/29/2024    6:17 AM 06/28/2024   12:22 PM  CBC  WBC 4.0 - 10.5 K/uL 7.0  6.5    Hemoglobin 12.0 - 15.0 g/dL 88.6  89.0  86.0   Hematocrit 36.0 - 46.0 % 37.6  35.7  41.0   Platelets 150 - 400 K/uL 157  164        Latest Ref Rng & Units 07/01/2024    8:25 AM 06/30/2024    5:58 AM 06/29/2024    6:17 AM  BMP  Glucose 70 - 99 mg/dL 843  86  95   BUN 8 - 23 mg/dL 18  14  15    Creatinine 0.44 - 1.00 mg/dL 8.43  8.56  8.43   Sodium 135 - 145 mmol/L 136  140  143   Potassium 3.5 - 5.1 mmol/L 4.6  2.8  3.1   Chloride 98 - 111 mmol/L 98  98  99   CO2 22 - 32 mmol/L 27  27  28    Calcium  8.9 - 10.3 mg/dL 8.3  7.8  8.2    EP PPM/ICD IMPLANT Result Date: 07/02/2024 SURGEON:  Eulas FORBES Furbish, MD   PREPROCEDURE DIAGNOSIS:  Syncope, atrial fibrillation   POSTPROCEDURE DIAGNOSIS: Syncope, atrial fibrillation      PROCEDURES:  1. Implantable loop recorder implantation   INTRODUCTION:  [name] presents with a history of syncope The costs of loop recorder monitoring have been discussed with the patient.   DESCRIPTION OF PROCEDURE:  Informed written consent was obtained.  A timeout was performed. The patient required no sedation for the procedure today. The patients left chest was prepped and draped in the usual sterile fashion. The skin overlying the left parasternal region was infiltrated with lidocaine  for local analgesia.  A 0.5-cm incision was made over the left parasternal region over the 3rd intercostal space.  A  subcutaneous ILR pocket was fashioned using a combination of sharp and blunt dissection.  A Medtronic Reveal LINQ 2 implantable loop recorder was then placed into the pocket  R waves were very prominent and measured >0.23mV.  Steri- Strips and a sterile dressing were then applied.  There were no early apparent complications.   CONCLUSIONS:  1. Successful implantation of a implantable loop recorder for a history of suspected syncope, atrial fibrillation, prolonged QT  2. No early apparent complications. Eulas FORBES Furbish, MD Cardiac Electrophysiology    VAS US  CAROTID Result Date: 07/02/2024 Carotid Arterial Duplex Study Patient Name:  [name]  Date of Exam:   07/02/2024 Medical Rec #: [id number]        Accession #:    [id number] Date of Birth: [birth year]        Patient Gender: F Patient Age:   18 years Exam Location:  [name] Procedure:      VAS US  CAROTID Referring Phys: TAYE GONFA --------------------------------------------------------------------------------  Indications:       CVA, Carotid artery disease and Syncope. Risk Factors:      Hypertension, hyperlipidemia, no history of smoking. Comparison Study:  Previous study on 2.9.2024. Performing Technologist: Edilia Elden Appl  Examination Guidelines: A complete evaluation includes B-mode imaging, spectral Doppler, color Doppler, and power Doppler as needed of all accessible portions of each vessel. Bilateral testing is considered an integral part of a complete examination. Limited examinations for reoccurring indications may be performed as noted.  Right Carotid Findings: +----------+--------+--------+--------+-------------------------+--------+           PSV cm/sEDV cm/sStenosisPlaque Description       Comments +----------+--------+--------+--------+-------------------------+--------+ CCA Prox  89      12                                                +----------+--------+--------+--------+-------------------------+--------+  CCA Distal74      16              hyperechoic and smooth            +----------+--------+--------+--------+-------------------------+--------+ ICA Prox  77      16              heterogenous and calcific         +----------+--------+--------+--------+-------------------------+--------+ ICA Mid   68      18                                                +----------+--------+--------+--------+-------------------------+--------+ ICA Distal93      18                                                +----------+--------+--------+--------+-------------------------+--------+ ECA       106     13                                                +----------+--------+--------+--------+-------------------------+--------+ +----------+--------+-------+----------------+-------------------+           PSV cm/sEDV cmsDescribe        Arm Pressure (mmHG) +----------+--------+-------+----------------+-------------------+ Subclavian171            Multiphasic, WNL                    +----------+--------+-------+----------------+-------------------+ +---------+--------+--+--------+--+---------+ VertebralPSV cm/s56EDV cm/s13Antegrade +---------+--------+--+--------+--+---------+  Left Carotid Findings: +----------+--------+--------+--------+-------------------------+--------+           PSV cm/sEDV cm/sStenosisPlaque Description       Comments +----------+--------+--------+--------+-------------------------+--------+ CCA Prox  101     14                                                +----------+--------+--------+--------+-------------------------+--------+ CCA Distal107     17  heterogenous                      +----------+--------+--------+--------+-------------------------+--------+ ICA Prox  81      17              heterogenous and calcific         +----------+--------+--------+--------+-------------------------+--------+ ICA Mid   94      25                                                 +----------+--------+--------+--------+-------------------------+--------+ ICA Distal73      17                                                +----------+--------+--------+--------+-------------------------+--------+ ECA       124     15                                                +----------+--------+--------+--------+-------------------------+--------+ +----------+--------+--------+----------------+-------------------+           PSV cm/sEDV cm/sDescribe        Arm Pressure (mmHG) +----------+--------+--------+----------------+-------------------+ Subclavian160             Multiphasic, WNL                    +----------+--------+--------+----------------+-------------------+ +---------+--------+--+--------+-+---------+ VertebralPSV cm/s29EDV cm/s9Antegrade +---------+--------+--+--------+-+---------+   Summary: Right Carotid: Velocities in the right ICA are consistent with a 1-39% stenosis. Left Carotid: Velocities in the left ICA are consistent with a 1-39% stenosis. Vertebrals:  Bilateral vertebral arteries demonstrate antegrade flow. Subclavians: Normal flow hemodynamics were seen in bilateral subclavian              arteries. *See table(s) above for measurements and observations.  Electronically signed by Debby Robertson on 07/02/2024 at 4:08:32 PM.    Final      Family Communication: Discussed with patient, family at bedside. They understand and agree. All questions answered.  Disposition: Status is: inpatient because: pain control, neuro surgery follow up, CIR  Planned Discharge Destination: Rehab     Time spent: 46 minutes  Author: Concepcion Riser, MD 07/02/2024 4:43 PM Secure chat 7am to 7pm For on call review [email or web address].

## 2024-07-02 NOTE — Progress Notes (Signed)
 Progress Note  Patient Name: DAVANNA HE Date of Encounter: 07/02/2024  Primary Cardiologist: Alvan Carrier, MD   Subjective   Pain controlled at present.  Agreeable to loop implant, husband is bedside this morning Limited jaw ROM  Inpatient Medications    Scheduled Meds:  amiodarone   200 mg Oral Daily   arformoterol   15 mcg Nebulization BID   atorvastatin   40 mg Oral q AM   budesonide  (PULMICORT ) nebulizer solution  0.5 mg Nebulization BID   dapagliflozin  propanediol  10 mg Oral q AM   escitalopram   10 mg Oral Q breakfast   feeding supplement  237 mL Oral BID BM   Influenza vac split trivalent PF  0.5 mL Intramuscular Tomorrow-1000   insulin  aspart  0-6 Units Subcutaneous Q4H   levothyroxine   100 mcg Oral Q0600   losartan   25 mg Oral q AM   multivitamin with minerals  1 tablet Oral q AM   pantoprazole   40 mg Oral QAC breakfast   revefenacin   175 mcg Nebulization Daily   torsemide   30 mg Oral Daily   trimethobenzamide   200 mg Intramuscular Once   Continuous Infusions:  ampicillin -sulbactam (UNASYN ) IV 3 g (07/02/24 0513)   PRN Meds: acetaminophen  **OR** acetaminophen , HYDROmorphone  (DILAUDID ) injection, ipratropium-albuterol , ondansetron  **OR** ondansetron  (ZOFRAN ) IV, oxyCODONE    Vital Signs    Vitals:   07/01/24 1800 07/01/24 2011 07/02/24 0353 07/02/24 0744  BP:  (!) 131/54 108/88   Pulse: 66 68  68  Resp: 16 12  14   Temp:  99 F (37.2 C) 98 F (36.7 C)   TempSrc:  Oral Oral   SpO2: 99% 94%    Weight:      Height:        Intake/Output Summary (Last 24 hours) at 07/02/2024 0752 Last data filed at 07/02/2024 0513 Gross per 24 hour  Intake 100 ml  Output 500 ml  Net -400 ml   Filed Weights   06/28/24 1223  Weight: 68 kg    Telemetry    SR 1st degree AVblock, generally 60's-70's, no brady, no arrhythmias,  - Personally Reviewed  ECG    No new EKGs - Personally Reviewed  Physical Exam   GEN: No acute distress.   Extensive ecchymosis,  swelling face/neck Cardiac: RRR, no murmurs, rubs, or gallops.  Respiratory: CTA b/l GI: Soft, nontender, non-distended  MS: No edema; Face with ecchymosis, edema   Labs    Chemistry Recent Labs  Lab 06/28/24 1219 06/28/24 1222 06/29/24 0617 06/30/24 0558 07/01/24 0825  NA 138   < > 143 140 136  K 3.9   < > 3.1* 2.8* 4.6  CL 99   < > 99 98 98  CO2 25  --  28 27 27   GLUCOSE 121*   < > 95 86 156*  BUN 16   < > 15 14 18   CREATININE 1.75*   < > 1.56* 1.43* 1.56*  CALCIUM  8.8*  --  8.2* 7.8* 8.3*  PROT 7.2  --  6.4*  --   --   ALBUMIN  3.8  --  3.2*  --   --   AST 55*  --  25  --   --   ALT 26  --  19  --   --   ALKPHOS 62  --  58  --   --   BILITOT 1.4*  --  1.1  --   --   GFRNONAA 29*  --  33* 37* 33*  ANIONGAP 14  --  16* 15 11   < > = values in this interval not displayed.     Hematology Recent Labs  Lab 06/28/24 1219 06/28/24 1222 06/29/24 0617 06/30/24 0558  WBC 8.0  --  6.5 7.0  RBC 4.60  --  4.11 4.26  HGB 12.2 13.9 10.9* 11.3*  HCT 39.9 41.0 35.7* 37.6  MCV 86.7  --  86.9 88.3  MCH 26.5  --  26.5 26.5  MCHC 30.6  --  30.5 30.1  RDW 16.3*  --  16.1* 16.2*  PLT 191  --  164 157    Cardiac EnzymesNo results for input(s): TROPONINI in the last 168 hours. No results for input(s): TROPIPOC in the last 168 hours.   BNPNo results for input(s): BNP, PROBNP in the last 168 hours.   DDimer No results for input(s): DDIMER in the last 168 hours.   Summary of Pertinent studies    TTE: LVEF 70 to 75%.  Mitral valve was replaced.  Mild mitral stenosis with Edwards bioprosthetic valve present.  Bioprosthetic aortic valve with no stenosis or regurgitation.  LVEF is 70 to 75%.  12/19/2023: EF 55%, Gr II diastolic dysfunction.  Mild to moderately dilated left atrium, normal right atrium.  Status post MVR and AVR with bioprosthesis.  Prosthetic valves appear to be functioning normally.    Patient Profile     78 y.o. female with a history of  rheumatic  valve disease (MS/AI/AS ) status post pericardial MVR & AVR 2014,  CHFimpEF,  persistent atrial fibrillation/flutter,  stroke in 2013 in the setting of left atrial pended thrombus,  mild coronary artery disease,  obstructive lung disease, vasovagal syncope seen today for evaluation of possible syncope (?) resulting in trauma  -- intraparenchymal hemorrhage over the left frontal white matter, w/ minimal acute subarachnoid hemorrhage adjacent to this.  -- a comminuted left orbital floor fracture with partial entrapment of inferior rectus muscle.  -- a fracture of the medial border left orbit minimally displaced nasal bone fracture  -- a left maxillary fracture  Assessment & Plan    Fall -- possible syncope Resulting in intracranial hemorrhage, orbital fractures Prior syncope years ago attributed to vagal syncope in the setting of shoulder pain; more recent fall sounds more orthostatic Prodrome of confusion prior to fall prior to this admit Witnessed (husband) thinks she tripped and fell rather than had loss of consciousness Possible cardiogenic causes would include recurrence of AF with post-conversion pause, TdP due to prolonged QT -- it isn't clear though that she had syncope at all. Plan for loop implant today   Dr. Nancey had a long discussion with the patient's husband over the weekend.  He reports the patient had another fall about 2 weeks ago.  She did not lose consciousness, she just became weak and fell backwards.  She does have unsteadiness after standing up and has to stabilize and regain her equilibrium after standing.    S/P pericardial MVR, AVR in 2014 Valve replacement in 2014 due to rheumatic heart disease No severe stenosis on echocardiogram this admission (despite hyperdynamic EF)  Persistent atrial fibrillation and flutter On chronic amiodarone  Anticoagulation chronically with warfarin, currently held Would continue amiodarone  for now. Mainating sinus rhythm will  reduce stroke risk while off anticoagulation.  Resume anticoagulation when cleared to do so by neurosurgery team  Secondary hypercoagulable state History of prior cardioembolic stroke On warfarin chronically due to valvular AF Warfarin held, not reversed per neurosurgery Resume when cleared  to do so  Prolonged QT On amiodarone  Keep potassium > 4, magnesium  > 2 --aggressively Hypokalemic yesterday > replaced, better yesterday Hold other rate-slowing medications; avoid bradycardia EKG yesterday QTc  Hypertension On valsartan 25, bisoprolol  5 as outpatient Husband reports systolic blood pressures at home typically in the 100s BPs often elevated here --continue valsartan for now Keep OFF betablocker   For questions or updates, please contact [facility] Please consult [email or web address] for contact info under Cardiology/STEMI.      Signed, [name], PA-C 07/02/2024, 7:52 AM

## 2024-07-02 NOTE — Plan of Care (Signed)
 Problem: Education: Goal: Ability to describe self-care measures that may prevent or decrease complications (Diabetes Survival Skills Education) will improve 07/02/2024 0144 by Marvis Kenneth SAILOR, RN Outcome: Progressing 07/01/2024 2336 by Marvis Kenneth SAILOR, RN Outcome: Progressing Goal: Individualized Educational Video(s) 07/02/2024 0144 by Marvis Kenneth SAILOR, RN Outcome: Progressing 07/01/2024 2336 by Marvis Kenneth SAILOR, RN Outcome: Progressing   Problem: Coping: Goal: Ability to adjust to condition or change in health will improve 07/02/2024 0144 by Marvis Kenneth SAILOR, RN Outcome: Progressing 07/01/2024 2336 by Marvis Kenneth SAILOR, RN Outcome: Progressing   Problem: Fluid Volume: Goal: Ability to maintain a balanced intake and output will improve 07/02/2024 0144 by Marvis Kenneth SAILOR, RN Outcome: Progressing 07/01/2024 2336 by Marvis Kenneth SAILOR, RN Outcome: Progressing   Problem: Health Behavior/Discharge Planning: Goal: Ability to identify and utilize available resources and services will improve 07/02/2024 0144 by Marvis Kenneth SAILOR, RN Outcome: Progressing 07/01/2024 2336 by Marvis Kenneth SAILOR, RN Outcome: Progressing Goal: Ability to manage health-related needs will improve 07/02/2024 0144 by Marvis Kenneth SAILOR, RN Outcome: Progressing 07/01/2024 2336 by Marvis Kenneth SAILOR, RN Outcome: Progressing   Problem: Metabolic: Goal: Ability to maintain appropriate glucose levels will improve 07/02/2024 0144 by Marvis Kenneth SAILOR, RN Outcome: Progressing 07/01/2024 2336 by Marvis Kenneth SAILOR, RN Outcome: Progressing   Problem: Nutritional: Goal: Maintenance of adequate nutrition will improve 07/02/2024 0144 by Marvis Kenneth SAILOR, RN Outcome: Progressing 07/01/2024 2336 by Marvis Kenneth SAILOR, RN Outcome: Progressing Goal: Progress toward achieving an optimal weight will improve 07/02/2024 0144 by Marvis Kenneth SAILOR, RN Outcome: Progressing 07/01/2024 2336 by Marvis Kenneth SAILOR,  RN Outcome: Progressing   Problem: Skin Integrity: Goal: Risk for impaired skin integrity will decrease 07/02/2024 0144 by Marvis Kenneth SAILOR, RN Outcome: Progressing 07/01/2024 2336 by Marvis Kenneth SAILOR, RN Outcome: Progressing   Problem: Tissue Perfusion: Goal: Adequacy of tissue perfusion will improve 07/02/2024 0144 by Marvis Kenneth SAILOR, RN Outcome: Progressing 07/01/2024 2336 by Marvis Kenneth SAILOR, RN Outcome: Progressing   Problem: Education: Goal: Knowledge of General Education information will improve Description: Including pain rating scale, medication(s)/side effects and non-pharmacologic comfort measures 07/02/2024 0144 by Marvis Kenneth SAILOR, RN Outcome: Progressing 07/01/2024 2336 by Marvis Kenneth SAILOR, RN Outcome: Progressing   Problem: Health Behavior/Discharge Planning: Goal: Ability to manage health-related needs will improve 07/02/2024 0144 by Marvis Kenneth SAILOR, RN Outcome: Progressing 07/01/2024 2336 by Marvis Kenneth SAILOR, RN Outcome: Progressing   Problem: Clinical Measurements: Goal: Ability to maintain clinical measurements within normal limits will improve 07/02/2024 0144 by Marvis Kenneth SAILOR, RN Outcome: Progressing 07/01/2024 2336 by Marvis Kenneth SAILOR, RN Outcome: Progressing Goal: Will remain free from infection 07/02/2024 0144 by Marvis Kenneth SAILOR, RN Outcome: Progressing 07/01/2024 2336 by Marvis Kenneth SAILOR, RN Outcome: Progressing Goal: Diagnostic test results will improve 07/02/2024 0144 by Marvis Kenneth SAILOR, RN Outcome: Progressing 07/01/2024 2336 by Marvis Kenneth SAILOR, RN Outcome: Progressing Goal: Respiratory complications will improve 07/02/2024 0144 by Marvis Kenneth SAILOR, RN Outcome: Progressing 07/01/2024 2336 by Marvis Kenneth SAILOR, RN Outcome: Progressing Goal: Cardiovascular complication will be avoided 07/02/2024 0144 by Marvis Kenneth SAILOR, RN Outcome: Progressing 07/01/2024 2336 by Marvis Kenneth SAILOR, RN Outcome: Progressing    Problem: Activity: Goal: Risk for activity intolerance will decrease 07/02/2024 0144 by Marvis Kenneth SAILOR, RN Outcome: Progressing 07/01/2024 2336 by Marvis Kenneth SAILOR, RN Outcome: Progressing   Problem: Nutrition: Goal: Adequate nutrition will be maintained 07/02/2024 0144 by Marvis Kenneth SAILOR, RN Outcome: Progressing 07/01/2024 2336 by Marvis Kenneth SAILOR, RN Outcome: Progressing  Problem: Coping: Goal: Level of anxiety will decrease 07/02/2024 0144 by Marvis Kenneth SAILOR, RN Outcome: Progressing 07/01/2024 2336 by Marvis Kenneth SAILOR, RN Outcome: Progressing   Problem: Elimination: Goal: Will not experience complications related to bowel motility 07/02/2024 0144 by Marvis Kenneth SAILOR, RN Outcome: Progressing 07/01/2024 2336 by Marvis Kenneth SAILOR, RN Outcome: Progressing Goal: Will not experience complications related to urinary retention 07/02/2024 0144 by Marvis Kenneth SAILOR, RN Outcome: Progressing 07/01/2024 2336 by Marvis Kenneth SAILOR, RN Outcome: Progressing   Problem: Pain Managment: Goal: General experience of comfort will improve and/or be controlled 07/02/2024 0144 by Marvis Kenneth SAILOR, RN Outcome: Progressing 07/01/2024 2336 by Marvis Kenneth SAILOR, RN Outcome: Progressing   Problem: Safety: Goal: Ability to remain free from injury will improve 07/02/2024 0144 by Marvis Kenneth SAILOR, RN Outcome: Progressing 07/01/2024 2336 by Marvis Kenneth SAILOR, RN Outcome: Progressing   Problem: Skin Integrity: Goal: Risk for impaired skin integrity will decrease 07/02/2024 0144 by Marvis Kenneth SAILOR, RN Outcome: Progressing 07/01/2024 2336 by Marvis Kenneth SAILOR, RN Outcome: Progressing

## 2024-07-02 NOTE — Progress Notes (Signed)
 Inpatient Rehab Admissions Coordinator:  Saw pt, husband Lamar and son-in-law Ellender at bedside. Pt was asleep so spoke with family. Discussed CIR goals and expectations. Pt's husband and son-in-law acknowledged understanding and are supportive of pt pursuing CIR. Family confirmed that family and friends will be able to provide 24/7 support for pt after discharge. Will continue to follow.   Tinnie Yvone Cohens, MS, CCC-SLP Admissions Coordinator [phone]

## 2024-07-02 NOTE — Progress Notes (Signed)
 PT Cancellation Note  Patient Details Name: [name] MRN: [id number] DOB: December 11, 1945   Cancelled Treatment:    Reason Eval/Treat Not Completed: Patient at procedure or test/unavailable  Will reattempt as schedule permits   [name], PT Acute Rehabilitation Services  Office [phone]  [name] 07/02/2024, 1:47 PM

## 2024-07-02 NOTE — Progress Notes (Signed)
   07/02/24 1445  Vitals  Temp 98.8 F (37.1 C)  Temp Source Oral  BP (!) 112/46  MAP (mmHg) 65  BP Location Right Arm  BP Method Automatic  Patient Position (if appropriate) Lying  Pulse Rate 74  Pulse Rate Source Monitor  ECG Heart Rate 75  Resp 18  Level of Consciousness  Level of Consciousness Alert  MEWS COLOR  MEWS Score Color Green  Oxygen  Therapy  SpO2 99 %  O2 Device Nasal Cannula  O2 Flow Rate (L/min) 1 L/min  MEWS Score  MEWS Temp 0  MEWS Systolic 0  MEWS Pulse 0  MEWS RR 0  MEWS LOC 0  MEWS Score 0   Pt return to unit from loop recorder implant. Incision site covered with drsg and scant amount of sanguineous drainage.

## 2024-07-02 NOTE — Progress Notes (Signed)
 OT Cancellation Note  Patient Details Name: [name] MRN: [id number] DOB: [birth year]   Cancelled Treatment:    Reason Eval/Treat Not Completed: Patient at procedure or test/ unavailable   [name], OTR/L [facility] Acute Rehabilitation Office: [phone]  [name] 07/02/2024, 1:56 PM

## 2024-07-02 NOTE — Progress Notes (Signed)
 Loop recorder placed this afternoon EP team will sign off, though remain available Please recall if needed  Charlies Arthur, PA-C

## 2024-07-02 NOTE — PMR Pre-admission (Shared)
 PMR Admission Coordinator Pre-Admission Assessment  Patient: [name] is an 78 y.o., female MRN: [id number] DOB: 1946/08/20 Height: 5' 3 (160 cm) Weight: 68 kg  Insurance Information HMO:      PPO: yes     PCP:      IPA:      80/20:      OTHER:  PRIMARY: [employer] PPO     Policy#: [id number]      Subscriber: patient CM Name: ***      Phone#: ***     Fax#: *** Pre-Cert#: ***      Employer:  Benefits:  Phone #: [phone]     Name:  Eff. Date: 10/04/23     Deduct: $0      Out of Pocket Max: $4000 (met $1264)      Life Max: n/a CIR: $160/day for days 1-10      SNF: 20 full days Outpatient:      Co-Pay: $20/visit Home Health: 100%      Co-Pay:  DME: 80%     Co-Pay: 20% Providers: in-network SECONDARY:       Policy#:      Phone#:   Artist:       Phone#:   The Data processing manager" for patients in Inpatient Rehabilitation Facilities with attached "Privacy Act Statement-Health Care Records" was provided and verbally reviewed with: Patient and Family  Emergency Contact Information Contact Information     Name Relation Home Work Mobile   Dannisha, Eckmann Spouse [phone]  [phone]   Bayes,Jennifer Daughter [phone]  [phone]      Other Contacts   None on File     Current Medical History  Patient Admitting Diagnosis: Intraparenchymal hematoma History of Present Illness: Pt is an 78 year old female with medical hx significant for: A-fib, CVA, COPD, CKD-3B, NIDDM-2, hypothyroidism, lumbar laminectomy and decompression, mild cognitive impairment, AV and MV replacement, rheumatic heart disease, CAD. Pt presented to [facility] d/t syncopal episode. Pt fell, hit her head and lost consciousness. Had few episodes of emesis on way to hospital. Ct head/cervical/maxillofacial revealed orbital floor fracture, minimally displaced nasal bone fracture, fracture of junction of left nasal bone to frontal process of left maxilla.  Imaging also revealed acute intraparenchymal hemorrhage over left frontal white matter, minimal acute subarachnoid  hemorrhage over adjacent right frontal lobe.  Neurosurgery consulted. No intervention recommended at that time. ENT consulted. Repaired facial lacerations in ED. Cardiology consulted. Recommended loop implant on discharge. Ophthalmology consulted. Recommended outpatient follow up. Follow-up MRI head showed stable hemorrhage. MRI cervical spine showed degenerative disc disease and spondylosis; no frank cord compression. No neurosurgical intervention recommended. Pt underwent loop recorder placement on 07/02/24. Therapy evaluations completed and CIR recommended d/t pt's deficits in functional mobility.     Patient's medical record from [facility] has been reviewed by the rehabilitation admission coordinator and physician.  Past Medical History  Past Medical History:  Diagnosis Date   Anemia    Asthma 2010   Atrial fibrillation (HCC)    Atrial flutter (HCC)    Breast carcinoma (HCC) 1995   1995   Cardioembolic stroke (HCC) 01/2012   Right frontal in 01/2012; normal carotid ultrasound; possible LAA thrombus by TEE; virtual complete neurologic recovery   Carotid artery stenosis 1961   Chronic kidney disease, stage 2, mildly decreased GFR    GFR of approximately 60   Diabetes mellitus without complication (HCC) 5+ years ago   Controlled most of  time   Diverticulosis of colon (without mention of hemorrhage) 2012   Dr. Golda   Fasting hyperglycemia    120 fasting   Gastroesophageal reflux disease    Gastroparesis    Heart failure with improved ejection fraction (HFimpEF) (HCC) 5+ years   Heart murmur 1961   Hemorrhoids    History of aortic valve replacement with bioprosthetic valve    History of mitral valve replacement with bioprosthetic valve    Hyperlipidemia    Hypertension    pt denies 05/30/13     Dr Alvan chester   Hyponatremia    Hypothyroidism     Rheumatic heart disease    Shortness of breath    Syncope and collapse February, 2024    Has the patient had major surgery during 100 days prior to admission? No  Family History   family history includes Asthma in her maternal grandfather; Cirrhosis in her father; Diabetes in her sister; Heart disease (age of onset: 19) in her brother; Hypothyroidism in her mother; Rheum arthritis in her maternal grandmother.  Current Medications  Current Facility-Administered Medications:    acetaminophen  (TYLENOL ) tablet 650 mg, 650 mg, Oral, Q6H PRN, 650 mg at 07/04/24 1243 **OR** acetaminophen  (TYLENOL ) suppository 650 mg, 650 mg, Rectal, Q6H PRN, Gonfa, Taye T, MD   amiodarone  (PACERONE ) tablet 200 mg, 200 mg, Oral, Daily, Gonfa, Taye T, MD, 200 mg at 07/04/24 0848   arformoterol  (BROVANA ) nebulizer solution 15 mcg, 15 mcg, Nebulization, BID, Gonfa, Taye T, MD, 15 mcg at 07/04/24 0749   atorvastatin  (LIPITOR) tablet 40 mg, 40 mg, Oral, q AM, Gonfa, Taye T, MD, 40 mg at 07/04/24 0630   budesonide  (PULMICORT ) nebulizer solution 0.5 mg, 0.5 mg, Nebulization, BID, Gonfa, Taye T, MD, 0.5 mg at 07/04/24 0749   dapagliflozin  propanediol (FARXIGA ) tablet 10 mg, 10 mg, Oral, q AM, Gonfa, Taye T, MD, 10 mg at 07/04/24 0630   docusate sodium  (COLACE) capsule 100 mg, 100 mg, Oral, BID, Sreeram, Narendranath, MD, 100 mg at 07/04/24 0848   escitalopram  (LEXAPRO ) tablet 10 mg, 10 mg, Oral, Q breakfast, Gonfa, Taye T, MD, 10 mg at 07/04/24 0848   famotidine  (PEPCID ) tablet 20 mg, 20 mg, Oral, QHS, Patel, Pranav M, MD, 20 mg at 07/03/24 2038   feeding supplement (ENSURE PLUS HIGH PROTEIN) liquid 237 mL, 237 mL, Oral, BID BM, Sreeram, Narendranath, MD, 237 mL at 07/03/24 1753   HYDROmorphone  (DILAUDID ) injection 0.5 mg, 0.5 mg, Intravenous, Q4H PRN, Gonfa, Taye T, MD, 0.5 mg at 06/30/24 2233   Influenza vac split trivalent PF (FLUZONE HIGH-DOSE) injection 0.5 mL, 0.5 mL, Intramuscular, Tomorrow-1000, Sreeram,  Narendranath, MD   insulin  aspart (novoLOG ) injection 0-6 Units, 0-6 Units, Subcutaneous, Q4H, Gonfa, Taye T, MD, 2 Units at 07/03/24 2038   ipratropium-albuterol  (DUONEB) 0.5-2.5 (3) MG/3ML nebulizer solution 3 mL, 3 mL, Nebulization, Q4H PRN, Gonfa, Taye T, MD   levothyroxine  (SYNTHROID ) tablet 100 mcg, 100 mcg, Oral, Q0600, Sreeram, Narendranath, MD, 100 mcg at 07/04/24 0537   multivitamin with minerals tablet 1 tablet, 1 tablet, Oral, q AM, Darci Pore, MD, 1 tablet at 07/04/24 0630   ondansetron  (ZOFRAN ) tablet 4 mg, 4 mg, Oral, Q6H PRN **OR** ondansetron  (ZOFRAN ) injection 4 mg, 4 mg, Intravenous, Q6H PRN, Gonfa, Taye T, MD, 4 mg at 07/02/24 1240   oxyCODONE  (Oxy IR/ROXICODONE ) immediate release tablet 5 mg, 5 mg, Oral, Q4H PRN, Sreeram, Narendranath, MD, 5 mg at 07/03/24 2038   pantoprazole  (PROTONIX ) EC tablet 40 mg, 40 mg, Oral,  QAC breakfast, Gonfa, Taye T, MD, 40 mg at 07/04/24 0848   polyethylene glycol (MIRALAX  / GLYCOLAX ) packet 17 g, 17 g, Oral, Daily, Sreeram, Narendranath, MD, 17 g at 07/04/24 0848   revefenacin  (YUPELRI ) nebulizer solution 175 mcg, 175 mcg, Nebulization, Daily, Gonfa, Taye T, MD, 175 mcg at 07/04/24 0749   senna-docusate (Senokot-S) tablet 2 tablet, 2 tablet, Oral, QHS, Sreeram, Narendranath, MD, 2 tablet at 07/02/24 2013   torsemide  (DEMADEX ) tablet 30 mg, 30 mg, Oral, Daily, Gonfa, Taye T, MD, 30 mg at 07/04/24 0848   trimethobenzamide  (TIGAN ) injection 200 mg, 200 mg, Intramuscular, Once, Ula Prentice SAUNDERS, MD  Patients Current Diet:  Diet Order             DIET SOFT Room service appropriate? Yes; Fluid consistency: Thin  Diet effective now                   Precautions / Restrictions Precautions Precautions: Fall Precaution/Restrictions Comments: ?syncope prior to fall; husband uncertain. Restrictions Weight Bearing Restrictions Per Provider Order: No   Has the patient had 2 or more falls or a fall with injury in the past year?  Yes  Prior Activity Level Limited Community (1-2x/wk): gets out of house~2 days/week Community (5-7x/wk): gets out of house ~4 days/week  Prior Functional Level Self Care: Did the patient need help bathing, dressing, using the toilet or eating? Independent  Indoor Mobility: Did the patient need assistance with walking from room to room (with or without device)? Independent  Stairs: Did the patient need assistance with internal or external stairs (with or without device)? Independent  Functional Cognition: Did the patient need help planning regular tasks such as shopping or remembering to take medications? Needed some help  Patient Information Are you of Hispanic, Latino/a,or Spanish origin?: A. No, not of Hispanic, Latino/a, or Spanish origin What is your race?: A. White Do you need or want an interpreter to communicate with a doctor or health care staff?: 0. No  Patient's Response To:  Health Literacy and Transportation Is the patient able to respond to health literacy and transportation needs?: Yes Health Literacy - How often do you need to have someone help you when you read instructions, pamphlets, or other written material from your doctor or pharmacy?: Never In the past 12 months, has lack of transportation kept you from medical appointments or from getting medications?: No In the past 12 months, has lack of transportation kept you from meetings, work, or from getting things needed for daily living?: No  Journalist, newspaper / Equipment Home Equipment: Shower seat - built in, Grab bars - tub/shower (3 point cane)  Prior Device Use: Indicate devices/aids used by the patient prior to current illness, exacerbation or injury? Cane, rollator (used occasionally)  Current Functional Level Cognition  Orientation Level: Disoriented to time, Oriented to person, Oriented to place, Oriented to situation    Extremity Assessment (includes Sensation/Coordination)  Upper Extremity  Assessment: Generalized weakness  Lower Extremity Assessment: Defer to PT evaluation    ADLs  Overall ADL's : Needs assistance/impaired Eating/Feeding: Sitting, Minimal assistance Grooming: Minimal assistance, Standing, Brushing hair Upper Body Bathing: Minimal assistance, Sitting Lower Body Bathing: Maximal assistance, Sit to/from stand Upper Body Dressing : Minimal assistance, Sitting Lower Body Dressing: Maximal assistance, Sit to/from stand Lower Body Dressing Details (indicate cue type and reason): unable to perform figure 4 Toilet Transfer: Minimal assistance, Ambulation, Rolling walker (2 wheels) Toileting- Clothing Manipulation and Hygiene: Maximal assistance, Total assistance, Sit to/from stand  Functional mobility during ADLs: Minimal assistance, Rolling walker (2 wheels)    Mobility  Overal bed mobility: Needs Assistance Bed Mobility: Rolling, Sidelying to Sit Rolling: Contact guard assist Sidelying to sit: Min assist General bed mobility comments: roll to rt, with rail;assist to raise torso    Transfers  Overall transfer level: Needs assistance Equipment used: Rolling walker (2 wheels) Transfers: Sit to/from Stand Sit to Stand: Min assist Bed to/from chair/wheelchair/BSC transfer type:: Step pivot Step pivot transfers: Min assist, +2 safety/equipment General transfer comment: from recliner and toilet; cues for hand placement on ascent and descent    Ambulation / Gait / Stairs / Psychologist, prison and probation services  Ambulation/Gait Ambulation/Gait assistance: Editor, commissioning (Feet): 12 Feet (toileted; 30) Assistive device: Rolling walker (2 wheels) Gait Pattern/deviations: Step-through pattern, Decreased stride length, Shuffle, Trunk flexed General Gait Details: less assist to steer/maneuver RW compared to 9/30; continues to require vc for proximity to RW (with cues for upright posture) and to roll RW as she is walking; denied dizziness but endorsed fatigue Gait velocity  interpretation: <1.8 ft/sec, indicate of risk for recurrent falls    Posture / Balance Balance Overall balance assessment: Needs assistance, History of Falls Sitting-balance support: No upper extremity supported, Feet supported Sitting balance-Leahy Scale: Fair Standing balance support: Bilateral upper extremity supported Standing balance-Leahy Scale: Poor Standing balance comment: slight posterior lean    Special considerations/life events  Oxygen  2L, Skin Abrasion: face, hand, thigh/bilateral; Ecchymosis: face, eye, thigh, leg/bilateral; Erythema/Redness: face/lower, and Diabetic management Novolog  0-6 units every 4 hours   Previous Home Environment (from acute therapy documentation) Living Arrangements: Spouse/significant other Available Help at Discharge: Family, Available 24 hours/day Type of Home: House Home Layout: Two level, Able to live on main level with bedroom/bathroom Home Access: Stairs to enter Entrance Stairs-Rails: Right, Left Entrance Stairs-Number of Steps: 3 Bathroom Shower/Tub: Health visitor: Handicapped height Bathroom Accessibility: Yes How Accessible: Accessible via walker Home Care Services: No  Discharge Living Setting Plans for Discharge Living Setting: Patient's home, Lives with (comment) (spouse, daughter, SIL, neighbors to assist) Type of Home at Discharge: House Discharge Home Layout: Able to live on main level with bedroom/bathroom Discharge Home Access: Stairs to enter Entrance Stairs-Rails: Left, Right Entrance Stairs-Number of Steps: 3 Discharge Bathroom Shower/Tub: Walk-in shower Discharge Bathroom Toilet: Handicapped height Discharge Bathroom Accessibility: Yes How Accessible: Accessible via walker Does the patient have any problems obtaining your medications?: No  Social/Family/Support Systems Patient Roles: Spouse Anticipated Caregiver: spouse Lamar, daughter Delon Boyer and her spouse are primary  caregivers Anticipated Caregiver's Contact Information: Lamar  [phone] Ability/Limitations of Caregiver: none stated Caregiver Availability: 24/7 Discharge Plan Discussed with Primary Caregiver: Yes Is Caregiver In Agreement with Plan?: Yes Does Caregiver/Family have Issues with Lodging/Transportation while Pt is in Rehab?: No  Goals Patient/Family Goal for Rehab: PT/OT/SLP supervision Expected length of stay: 7-10 days Additional Information: Discharge plan: d/c to pt's home with spouse, dtr/SIL providing primary care, neighbors available to assist PRN Pt/Family Agrees to Admission and willing to participate: Yes Program Orientation Provided & Reviewed with Pt/Caregiver Including Roles  & Responsibilities: Yes  Decrease burden of Care through IP rehab admission: NA  Possible need for SNF placement upon discharge: Not anticipated  Patient Condition: This patient's condition remains as documented in the consult dated 9/29, in which the Rehabilitation Physician determined and documented that the patient's condition is appropriate for intensive rehabilitative care in an inpatient rehabilitation facility. Will admit to inpatient rehab pending insurance approval.  Preadmission Screen  Completed By:  Tinnie SHAUNNA Yvone Delayne, 07/04/2024 3:44 PM ______________________________________________________________________   Discussed status with Dr. PIERRETTE on *** at *** and received approval for admission today.  Admission Coordinator:  Tinnie SHAUNNA Yvone Delayne, CCC-SLP, time ***/Date ***   Assessment/Plan: Diagnosis: *** Does the need for close, 24 hr/day Medical supervision in concert with the patient's rehab needs make it unreasonable for this patient to be served in a less intensive setting? {yes_no_potentially:3041433} Co-Morbidities requiring supervision/potential complications: *** Due to {due un:6958565}, does the patient require 24 hr/day rehab nursing? {yes_no_potentially:3041433} Does the  patient require coordinated care of a physician, rehab nurse, PT, OT, and SLP to address physical and functional deficits in the context of the above medical diagnosis(es)? {yes_no_potentially:3041433} Addressing deficits in the following areas: {deficits:3041436} Can the patient actively participate in an intensive therapy program of at least 3 hrs of therapy 5 days a week? {yes_no_potentially:3041433} The potential for patient to make measurable gains while on inpatient rehab is {potential:3041437} Anticipated functional outcomes upon discharge from inpatient rehab: {functional outcomes:304600100} PT, {functional outcomes:304600100} OT, {functional outcomes:304600100} SLP Estimated rehab length of stay to reach the above functional goals is: *** Anticipated discharge destination: {anticipated dc setting:21604} 10. Overall Rehab/Functional Prognosis: {potential:3041437}   MD Signature: ***

## 2024-07-02 NOTE — Progress Notes (Deleted)
Pt return to unit.

## 2024-07-02 NOTE — Progress Notes (Signed)
 Pt off unit for loop implant. Pt's husband and belongings at bedside.

## 2024-07-02 NOTE — Progress Notes (Signed)
 Physical Therapy Treatment Patient Details Name: [name] MRN: [id number] DOB: 15-Jun-1946 Today's Date: 07/02/2024   History of Present Illness Pt is an 78 y.o. female presenting 9/26 after ground level fall. MRI brain with 1.2 cm acute intraparenchymal hemorrhage at the subcortical L frontal lobe, likely a small hemorrhagic contusion; intraventricular hemorrhage layering within the occipital horns of both lateral ventricles. L orbital fx PMH: HFpEF, A-fib/flutter on warfarin, rheumatic heart disease, AV and MV replacement, COPD, CVA, carotid artery stenosis, CKD-3B, NIDDM-2, hypothyroidism, lumbar laminectomy and decompression and mild cognitive impairment    PT Comments  Patient eager to work with PT as she wants to get well enough to go home soon. Discussed potential post-acute inpatient therapies >3 hrs/day and she and husband agree this would be ideal. She tolerated 2 bouts of ambulation x 25 ft with RW and min assist with seated rest between. Sats remained 100%on 2L throughout, however +dyspnea with activity. Denied dizziness and endorsed fatigue as reason she needed to sit after 25 ft. Continues to require vc for hand placement and proximity to RW. Requires min assist for steering/maneuvering RW when turning.     If plan is discharge home, recommend the following: A lot of help with walking and/or transfers;A lot of help with bathing/dressing/bathroom;Assistance with cooking/housework;Direct supervision/assist for medications management;Direct supervision/assist for financial management;Assist for transportation;Help with stairs or ramp for entrance;Supervision due to cognitive status   Can travel by private vehicle        Equipment Recommendations  Rolling walker (2 wheels)    Recommendations for Other Services       Precautions / Restrictions Precautions Precautions: Fall Precaution/Restrictions Comments: ?syncope prior to fall; husband uncertain. Required Braces or Orthoses:   (c-collar discontinued) Restrictions Weight Bearing Restrictions Per Provider Order: No     Mobility  Bed Mobility Overal bed mobility: Needs Assistance Bed Mobility: Rolling, Sidelying to Sit Rolling: Min assist Sidelying to sit: Min assist       General bed mobility comments: roll to rt, no rail;assist to raise torso    Transfers Overall transfer level: Needs assistance Equipment used: Rolling walker (2 wheels) Transfers: Sit to/from Stand Sit to Stand: Min assist           General transfer comment: x 3 reps with vc needed each time for hand placement with RW    Ambulation/Gait Ambulation/Gait assistance: Min assist Gait Distance (Feet): 25 Feet (seated rest, 25 ft) Assistive device: Rolling walker (2 wheels) Gait Pattern/deviations: Step-through pattern, Decreased stride length, Shuffle       General Gait Details: assist to steer/maneuver RW; denied dizziness but endorsed fatigue   Stairs             Wheelchair Mobility     Tilt Bed    Modified Rankin (Stroke Patients Only)       Balance Overall balance assessment: Needs assistance, History of Falls Sitting-balance support: No upper extremity supported, Feet supported Sitting balance-Leahy Scale: Fair     Standing balance support: Bilateral upper extremity supported Standing balance-Leahy Scale: Poor Standing balance comment: slight posterior lean                            Communication Communication Communication: No apparent difficulties  Cognition Arousal: Alert Behavior During Therapy: Flat affect   PT - Cognitive impairments: History of cognitive impairments, Memory  PT - Cognition Comments: per spouse pt has had memory problems since her CVA Following commands: Intact      Cueing Cueing Techniques: Verbal cues, Tactile cues  Exercises      General Comments General comments (skin integrity, edema, etc.): on 2L throughout with  sats 100% while ambulating      Pertinent Vitals/Pain Pain Assessment Pain Assessment: Faces Faces Pain Scale: Hurts a little bit Pain Location: all over Pain Descriptors / Indicators: Headache, Discomfort, Guarding Pain Intervention(s): Limited activity within patient's tolerance    Home Living                          Prior Function            PT Goals (current goals can now be found in the care plan section) Acute Rehab PT Goals Patient Stated Goal: feel better Time For Goal Achievement: 07/13/24 Potential to Achieve Goals: Good Progress towards PT goals: Progressing toward goals    Frequency    Min 2X/week      PT Plan      Co-evaluation              AM-PAC PT 6 Clicks Mobility   Outcome Measure  Help needed turning from your back to your side while in a flat bed without using bedrails?: A Little Help needed moving from lying on your back to sitting on the side of a flat bed without using bedrails?: A Little Help needed moving to and from a bed to a chair (including a wheelchair)?: A Little Help needed standing up from a chair using your arms (e.g., wheelchair or bedside chair)?: A Little Help needed to walk in hospital room?: A Little Help needed climbing 3-5 steps with a railing? : Total 6 Click Score: 16    End of Session Equipment Utilized During Treatment: Gait belt;Oxygen  Activity Tolerance: Patient limited by fatigue Patient left: in chair;with call bell/phone within reach;with family/visitor present Nurse Communication: Mobility status PT Visit Diagnosis: Unsteadiness on feet (R26.81);History of falling (Z91.81);Pain Pain - Right/Left: Left Pain - part of body:  (eye/head)     Time: 8481-8454 PT Time Calculation (min) (ACUTE ONLY): 27 min  Charges:    $Gait Training: 23-37 mins PT General Charges $$ ACUTE PT VISIT: 1 Visit                      [name], PT Acute Rehabilitation Services  Office  [phone]    [name] 07/02/2024, 4:40 PM

## 2024-07-03 ENCOUNTER — Encounter

## 2024-07-03 ENCOUNTER — Inpatient Hospital Stay (HOSPITAL_COMMUNITY)

## 2024-07-03 DIAGNOSIS — M47816 Spondylosis without myelopathy or radiculopathy, lumbar region: Secondary | ICD-10-CM | POA: Diagnosis not present

## 2024-07-03 DIAGNOSIS — M549 Dorsalgia, unspecified: Secondary | ICD-10-CM | POA: Diagnosis not present

## 2024-07-03 DIAGNOSIS — I611 Nontraumatic intracerebral hemorrhage in hemisphere, cortical: Secondary | ICD-10-CM | POA: Diagnosis not present

## 2024-07-03 DIAGNOSIS — M19011 Primary osteoarthritis, right shoulder: Secondary | ICD-10-CM | POA: Diagnosis not present

## 2024-07-03 DIAGNOSIS — M47814 Spondylosis without myelopathy or radiculopathy, thoracic region: Secondary | ICD-10-CM | POA: Diagnosis not present

## 2024-07-03 DIAGNOSIS — Z043 Encounter for examination and observation following other accident: Secondary | ICD-10-CM | POA: Diagnosis not present

## 2024-07-03 DIAGNOSIS — S06321A Contusion and laceration of left cerebrum with loss of consciousness of 30 minutes or less, initial encounter: Secondary | ICD-10-CM | POA: Diagnosis not present

## 2024-07-03 DIAGNOSIS — M4804 Spinal stenosis, thoracic region: Secondary | ICD-10-CM | POA: Diagnosis not present

## 2024-07-03 DIAGNOSIS — M48061 Spinal stenosis, lumbar region without neurogenic claudication: Secondary | ICD-10-CM | POA: Diagnosis not present

## 2024-07-03 DIAGNOSIS — S0232XA Fracture of orbital floor, left side, initial encounter for closed fracture: Secondary | ICD-10-CM | POA: Diagnosis not present

## 2024-07-03 LAB — GLUCOSE, CAPILLARY
Glucose-Capillary: 118 mg/dL — ABNORMAL HIGH (ref 70–99)
Glucose-Capillary: 102 mg/dL — ABNORMAL HIGH (ref 70–99)
Glucose-Capillary: 189 mg/dL — ABNORMAL HIGH (ref 70–99)
Glucose-Capillary: 114 mg/dL — ABNORMAL HIGH (ref 70–99)
Glucose-Capillary: 201 mg/dL — ABNORMAL HIGH (ref 70–99)

## 2024-07-03 MED ORDER — FAMOTIDINE 20 MG PO TABS
20.0000 mg | ORAL_TABLET | Freq: Every day | ORAL | Status: DC
  Administered 2024-07-03 – 2024-07-09 (×7): 20 mg via ORAL
  Filled 2024-07-03 (×7): qty 1

## 2024-07-03 NOTE — Progress Notes (Signed)
 Occupational Therapy Treatment Patient Details Name: [name] MRN: [id number] DOB: Sep 07, 1946 Today's Date: 07/03/2024   History of present illness Pt is an 78 y.o. female presenting 9/26 after ground level fall. MRI brain with 1.2 cm acute intraparenchymal hemorrhage at the subcortical L frontal lobe, likely a small hemorrhagic contusion; intraventricular hemorrhage layering within the occipital horns of both lateral ventricles. L orbital fx PMH: HFpEF, A-fib/flutter on warfarin, rheumatic heart disease, AV and MV replacement, COPD, CVA, carotid artery stenosis, CKD-3B, NIDDM-2, hypothyroidism, lumbar laminectomy and decompression and mild cognitive impairment   OT comments  Pt progressing toward established OT goals. Pt with soft Bps and mild dizziness throughout session (see general comments below). Pt eager to mobilize and husband present/supportive. Pt able to perform standing grooming tasks with min A for balance. Noting decreased activity tolerance needing seated rest before then ambulating to toilet. Facilitating activity tolerance with consecutive activity; pt observed to bump into objects on L; unsure if due to visual deficit or edema around L eye. Will continue to follow. Pt remains an excellent candidate for intensive inpatient rehabilitation.       If plan is discharge home, recommend the following:  A little help with walking and/or transfers;A little help with bathing/dressing/bathroom;Assistance with cooking/housework;Assist for transportation;Help with stairs or ramp for entrance;Direct supervision/assist for medications management;Direct supervision/assist for financial management   Equipment Recommendations  Other (comment) (defer)    Recommendations for Other Services Rehab consult;Speech consult    Precautions / Restrictions Precautions Precautions: Fall Precaution/Restrictions Comments: ?syncope prior to fall; husband uncertain. Required Braces or Orthoses:   (c-collar discontinued) Restrictions Weight Bearing Restrictions Per Provider Order: No       Mobility Bed Mobility                    Transfers Overall transfer level: Needs assistance Equipment used: Rolling walker (2 wheels) Transfers: Sit to/from Stand Sit to Stand: Min assist           General transfer comment: from recliner and toilet; cues for hand placement on ascent and descent     Balance Overall balance assessment: Needs assistance, History of Falls Sitting-balance support: No upper extremity supported, Feet supported Sitting balance-Leahy Scale: Fair     Standing balance support: Bilateral upper extremity supported Standing balance-Leahy Scale: Poor                             ADL either performed or assessed with clinical judgement   ADL Overall ADL's : Needs assistance/impaired     Grooming: Minimal assistance;Standing;Brushing hair                   Toilet Transfer: Minimal assistance;Ambulation;Rolling walker (2 wheels)   Toileting- Clothing Manipulation and Hygiene: Maximal assistance;Total assistance;Sit to/from stand       Functional mobility during ADLs: Minimal assistance;Rolling walker (2 wheels)      Extremity/Trunk Assessment Upper Extremity Assessment Upper Extremity Assessment: Generalized weakness   Lower Extremity Assessment Lower Extremity Assessment: Defer to PT evaluation        Vision   Vision Assessment?: Yes;Vision impaired- to be further tested in functional context Eye Alignment: Within Functional Limits Tracking/Visual Pursuits: Other (comment) (difficulty trancking at end ranges on L due to difficulty holding L eye open as well as edema around orbital area affecting visual field) Visual Fields:  (difficulty detecting obstacles or stimulus in L visual field; significant orbital edema)   Perception  Praxis     Communication Communication Communication: No apparent difficulties    Cognition Arousal: Alert Behavior During Therapy: Flat affect Cognition: Cognition impaired   Orientation impairments: Time Awareness: Intellectual awareness intact, Online awareness impaired Memory impairment (select all impairments): Short-term memory Attention impairment (select first level of impairment): Sustained attention, Selective attention Executive functioning impairment (select all impairments): Initiation, Sequencing, Organization, Problem solving, Reasoning OT - Cognition Comments: slowed speed of processing and mobility.                 Following commands: Intact (but requires repetition)        Cueing   Cueing Techniques: Verbal cues, Tactile cues  Exercises      Shoulder Instructions       General Comments 2L supplementary O2 via [place]. BP at start of session after bringing BLE down in chair: 103/49 (64) HR 62; standing 92/49 (63) HR 73; sitting on toilet: 100/39 (57) HR 72; return to chair with BLE up: 119/52 (69) HR 63    Pertinent Vitals/ Pain       Pain Assessment Pain Assessment: Faces Faces Pain Scale: Hurts a little bit Pain Location: all over Pain Descriptors / Indicators: Headache, Discomfort, Guarding Pain Intervention(s): Monitored during session, Limited activity within patient's tolerance  Home Living                                          Prior Functioning/Environment              Frequency  Min 2X/week        Progress Toward Goals  OT Goals(current goals can now be found in the care plan section)  Progress towards OT goals: Progressing toward goals  Acute Rehab OT Goals Patient Stated Goal: get better OT Goal Formulation: With patient Time For Goal Achievement: 07/13/24 Potential to Achieve Goals: Good ADL Goals Pt Will Perform Grooming: standing;with supervision Pt Will Perform Lower Body Dressing: with supervision;with adaptive equipment;sit to/from stand Pt Will Transfer to Toilet:  ambulating;with contact guard assist Pt Will Perform Toileting - Clothing Manipulation and hygiene: with supervision;sit to/from stand  Plan      Co-evaluation                 AM-PAC OT 6 Clicks Daily Activity     Outcome Measure   Help from another person eating meals?: A Little Help from another person taking care of personal grooming?: A Little Help from another person toileting, which includes using toliet, bedpan, or urinal?: A Lot Help from another person bathing (including washing, rinsing, drying)?: A Lot Help from another person to put on and taking off regular upper body clothing?: A Little Help from another person to put on and taking off regular lower body clothing?: A Lot 6 Click Score: 15    End of Session Equipment Utilized During Treatment: Gait belt;Rolling walker (2 wheels)  OT Visit Diagnosis: Unsteadiness on feet (R26.81);Muscle weakness (generalized) (M62.81);Other symptoms and signs involving cognitive function;Pain;Low vision, both eyes (H54.2)   Activity Tolerance Patient tolerated treatment well   Patient Left in chair;with call bell/phone within reach;with chair alarm set;with family/visitor present   Nurse Communication Mobility status        Time: 9075-9047 OT Time Calculation (min): 28 min  Charges: OT General Charges $OT Visit: 1 Visit OT Treatments $Self Care/Home Management : 23-37 mins  Elma JONETTA Penner,  OTD, OTR/L [facility] Acute Rehabilitation Office: [phone]   Elma JONETTA Penner 07/03/2024, 10:04 AM

## 2024-07-03 NOTE — Hospital Course (Addendum)
 [name] is a 78 y.o. female with PMH of HFpEF, A-fib/flutter on warfarin, rheumatic heart disease, AV and MV replacement, COPD, CVA, carotid artery stenosis, CKD-3B, NIDDM-2, hypothyroidism, lumbar laminectomy and decompression and mild cognitive impairment brought to ED by EMS after syncopal episode.    Patient sustained facial injury, CT head cervical and maxillofacial showed 7 mm focus of acute IPH over the left frontal white matter, minimal acute SAH over the distal right frontal lobe, depressed comminuted left orbital floor fracture with bilateral entrapment of inferior rectus muscle, fracture of medial wall of the left orbit without entrapment of intraorbital contents, minimally displaced nasal bone fracture as well as fracture of junction of left nasal bone to the frontal process of left maxilla. Neurosurgery consulted recommended to hold anticoagulation, syncope workup.  ENT and ophthalmology evaluated the patient.  Admitted to TRH service for further management and syncope evaluation. ENT repaired lacerations. Cardiology placed ILR.  Assessment and Plan: Syncope and collapse Presented with a witnessed syncopal event with loss of consciousness and head trauma. Found to have IPH and contusion likely due to trauma, but no evidence of stroke or other etiologies to explain syncope on MRI brain. No evidence of reoccurrence of seizures here. Preserved EF on echocardiogram, no significant valvular abnormality. Cardiology/EP was consulted, ILR was recommended and placed. Concern for recurrence of A-fib as a cause of her syncope.   Orbital floor fracture, nasal bone fracture Left inferior rectus muscle entrapment. CT maxillofacial Depressed comminuted left orbital floor fracture with partial entrapment of inferior rectus muscle. Fracture of the medial wall the left orbit without entrapment of intraorbital contents. Minimally displaced nasal bone fracture as well as fracture of the junction  of the left nasal bone to the frontal process of the left maxilla. seen by ENT Dr. Tobie, repaired laceration injuries. Also seen by ophthalmology. Treated with Unasyn . Patient reported back pain as well as shoulder pain.  X-ray of the thoracic and lumbar spine as well as right shoulder were negative for any acute abnormality.  ENT recommending Peridex  twice daily, Augmentin twice daily 7-day, bacitracin  twice daily 7-day.  EOT 10/2. No nose blowing for 2 weeks, as needed Afrin for epistaxis Soft diet for 5-day and then advance to regular. ENT follow-up in 2 to 3 weeks. Ophthalmology Dr. Corbin recommended outpatient follow-up.  Suspect entrapment is unlikely by clinical exam.   Acute intra parenchymal hemorrhage on warfarin. Surrounding cerebral edema Seen by neurosurgery Dr. Onetha. Repeat MRI reviewed by neurosurgery, stable. No surgical intervention.   Cervical spine MRI unremarkable.  Cervical collar removed. Discussed with Dr. Onetha, initiate warfarin 72 hours after initial injury. Repeat CT scan performed on 10/2 due to patient complaining of worsening headache which shows no new areas of intracranial hemorrhage and expected evolution of the prior hemorrhages seen. Due to prophylaxis now on hold again.   Hypokalemia Corrected.  Abnormal lab. On 10/6 patient's INR came back as more than detectable range more than 10. Patient also reported some epistaxis. Patient was given vitamin K and FFP were ordered. Patient received one fourth of the vitamin K when a recheck INR came back as 1.2 only. Epistaxis have resolved for now. For now we will be holding off on warfarin. Monitor daily INR.  Epistaxis. Afrin as needed. Monitor for resolution before initiating Coumadin .   Chronic HFpEF Chronic RV systolic dysfunction. Rheumatic valvular disease including MS/AI/AS s/p pericardial MVR, pericardial AVR 2014  Appears euvolemic. Current and prior echo shows right ventricular systolic  function  is moderately reduced. Continue torsemide , hold Farxiga .  Losartan , Bisoprolol  on hold.  Paroxysmal A-fib/flutter First-degree AV block Prolonged QT-last QTc 459 on 9/28. Continue amiodarone . Resume Coumadin  when epistaxis resolved.  Confirmed with EP no other options for anticoagulation.   Essential hypertension Continue torsemide , hold Farxiga . Losartan , Bisoprolol  on hold.   CKD stage IIIb: Baseline creatinine around 1.5.  On admission serum creatinine 1.7 Now trending down below baseline. Avoid nephrotoxic drugs.   Hypothyroidism:  continue home dose Synthroid  resumed.   GERD Continue PPI  Type 2 diabetes mellitus, uncontrolled with hyperglycemia with CKD without long-term insulin  use. Hemoglobin A1c 7.4 in May. CBG remains elevated. On Farxiga  at home.  Currently on sliding scale insulin .  Witnessed fall. Patient had reportedly a fall in the hospital.  She was trying to walk around and stood up and suddenly her legs gave and she was lowered down with assistance.  No injury was reported by the staff. Upon evaluation patient denies any injury.  Patient reports fatigue. No new focal deficit on exam. Reports headache after the fall.  Repeat CT scan actually shows resolution of IPH. No neuro events overnight.  Headache resolved. Will resume warfarin.

## 2024-07-03 NOTE — Progress Notes (Signed)
 Physical Therapy Treatment Patient Details Name: [name] MRN: [id number] DOB: 02-02-1946 Today's Date: 07/03/2024   History of Present Illness Pt is an 78 y.o. female presenting 9/26 after ground level fall. MRI brain with 1.2 cm acute intraparenchymal hemorrhage at the subcortical L frontal lobe, likely a small hemorrhagic contusion; intraventricular hemorrhage layering within the occipital horns of both lateral ventricles. L orbital fx PMH: HFpEF, A-fib/flutter on warfarin, rheumatic heart disease, AV and MV replacement, COPD, CVA, carotid artery stenosis, CKD-3B, NIDDM-2, hypothyroidism, lumbar laminectomy and decompression and mild cognitive impairment    PT Comments  Patient immediately asking to go to bathroom on PT arrival. Continues to require use of bed rail and up to min assist for bed mobility. Cues for sequencing to scoot out to EOB and to not try to pull herself forward using RW. Vc for sequencing with sit to stand plus min assist from EOB (CGA from taller toilet with grab bar). Maintained on 2L O2 as did not have time to do resting saturation check on room air prior to trip to bathroom. Sats 97-100%. Pt with better ability to maneuver/steer RW, however continues to require occasional assist and vc for proximity to RW.  After toileting, ambulated 30 ft with RW and min assist with return to recliner. Tolerated seated LE exercises. RT in to give breathing treatment at end of session.    If plan is discharge home, recommend the following: Assistance with cooking/housework;Direct supervision/assist for medications management;Direct supervision/assist for financial management;Assist for transportation;Help with stairs or ramp for entrance;Supervision due to cognitive status;A little help with walking and/or transfers;A little help with bathing/dressing/bathroom   Can travel by private vehicle        Equipment Recommendations  Rolling walker (2 wheels)    Recommendations for Other  Services       Precautions / Restrictions Precautions Precautions: Fall Precaution/Restrictions Comments: ?syncope prior to fall; husband uncertain. Required Braces or Orthoses:  (c-collar discontinued) Restrictions Weight Bearing Restrictions Per Provider Order: No     Mobility  Bed Mobility Overal bed mobility: Needs Assistance Bed Mobility: Rolling, Sidelying to Sit Rolling: Contact guard assist Sidelying to sit: Min assist       General bed mobility comments: roll to rt, with rail;assist to raise torso    Transfers Overall transfer level: Needs assistance Equipment used: Rolling walker (2 wheels) Transfers: Sit to/from Stand Sit to Stand: Min assist           General transfer comment: from EOB and toilet (tall) with grab bar; each time she starts to pull up from RW and requires cues to push up or use grab bar    Ambulation/Gait Ambulation/Gait assistance: Min assist Gait Distance (Feet): 12 Feet (toileted; 30) Assistive device: Rolling walker (2 wheels) Gait Pattern/deviations: Step-through pattern, Decreased stride length, Shuffle, Trunk flexed   Gait velocity interpretation: <1.8 ft/sec, indicate of risk for recurrent falls   General Gait Details: less assist to steer/maneuver RW compared to 9/30; continues to require vc for proximity to RW (with cues for upright posture) and to roll RW as she is walking; denied dizziness but endorsed fatigue   Stairs             Wheelchair Mobility     Tilt Bed    Modified Rankin (Stroke Patients Only)       Balance Overall balance assessment: Needs assistance, History of Falls Sitting-balance support: No upper extremity supported, Feet supported Sitting balance-Leahy Scale: Fair     Standing balance  support: Bilateral upper extremity supported Standing balance-Leahy Scale: Poor                              Communication Communication Communication: No apparent difficulties  Cognition  Arousal: Alert Behavior During Therapy: Flat affect   PT - Cognitive impairments: History of cognitive impairments, Memory                       PT - Cognition Comments: per spouse pt has had memory problems since her CVA Following commands: Intact (but requires repetition)      Cueing Cueing Techniques: Verbal cues, Tactile cues  Exercises General Exercises - Lower Extremity Ankle Circles/Pumps: AROM, Both, 10 reps Long Arc Quad: AROM, Both, 10 reps, Seated Hip Flexion/Marching: AROM, Both, 5 reps, Seated    General Comments General comments (skin integrity, edema, etc.): On 2L with sats 97-100%; pt eager to go to bathroom on arrival and did not take time to do room air assessment at rest      Pertinent Vitals/Pain Pain Assessment Pain Assessment: Faces Faces Pain Scale: Hurts a little bit Pain Location: all over Pain Descriptors / Indicators: Headache, Discomfort, Guarding Pain Intervention(s): Limited activity within patient's tolerance, Monitored during session    Home Living                          Prior Function            PT Goals (current goals can now be found in the care plan section) Acute Rehab PT Goals Patient Stated Goal: feel better Time For Goal Achievement: 07/13/24 Potential to Achieve Goals: Good Progress towards PT goals: Progressing toward goals    Frequency    Min 2X/week      PT Plan      Co-evaluation              AM-PAC PT 6 Clicks Mobility   Outcome Measure  Help needed turning from your back to your side while in a flat bed without using bedrails?: A Little Help needed moving from lying on your back to sitting on the side of a flat bed without using bedrails?: A Little Help needed moving to and from a bed to a chair (including a wheelchair)?: A Little Help needed standing up from a chair using your arms (e.g., wheelchair or bedside chair)?: A Little Help needed to walk in hospital room?: A  Little Help needed climbing 3-5 steps with a railing? : Total 6 Click Score: 16    End of Session Equipment Utilized During Treatment: Gait belt;Oxygen  Activity Tolerance: Patient limited by fatigue Patient left: in chair;with call bell/phone within reach;with family/visitor present;with chair alarm set Nurse Communication: Mobility status PT Visit Diagnosis: Unsteadiness on feet (R26.81);History of falling (Z91.81);Pain Pain - Right/Left: Left Pain - part of body:  (eye/head/everywhere)     Time: 9155-9095 PT Time Calculation (min) (ACUTE ONLY): 20 min  Charges:    $Gait Training: 8-22 mins PT General Charges $$ ACUTE PT VISIT: 1 Visit                      [name], PT Acute Rehabilitation Services  Office [phone]    [name] 07/03/2024, 9:15 AM

## 2024-07-03 NOTE — Progress Notes (Signed)
 Pt is alert, oriented x 3, forgetful, cooperative, afebrile, stable hemodynamically, sinus rhythm on the monitor, on 2 LPM of O2 NCL, no acute distress noted. She is able to ambulate well with one assistance. Pain is well tolerated. She is able to rest with no major complaints. Plan of care is reviewed. Pt has been medically progressing. We will continue to monitor.    Wendi Dash, RN

## 2024-07-03 NOTE — Progress Notes (Signed)
 Inpatient Rehab Admissions Coordinator:   Will start auth with [facility] today.  Reche Lowers, PT, DPT Admissions Coordinator [phone] 07/03/24  11:16 AM

## 2024-07-03 NOTE — Progress Notes (Signed)
 [facility] Progress Note Patient: [name] FMW:[id number] DOB: 13-Feb-1946  DOA: 06/28/2024 DOS: the patient was seen and examined on 07/03/2024  Brief Hospital Course: [name] is a 78 y.o. female with PMH of HFpEF, A-fib/flutter on warfarin, rheumatic heart disease, AV and MV replacement, COPD, CVA, carotid artery stenosis, CKD-3B, NIDDM-2, hypothyroidism, lumbar laminectomy and decompression and mild cognitive impairment brought to ED by EMS after syncopal episode.    Patient sustained facial injury, CT head cervical and maxillofacial showed 7 mm focus of acute IPH over the left frontal white matter, minimal acute SAH over the distal right frontal lobe, depressed comminuted left orbital floor fracture with bilateral entrapment of inferior rectus muscle, fracture of medial wall of the left orbit without entrapment of intraorbital contents, minimally displaced nasal bone fracture as well as fracture of junction of left nasal bone to the frontal process of left maxilla. Neurosurgery consulted recommended to hold anticoagulation, syncope workup.  ENT and ophthalmology evaluated the patient.  Admitted to TRH service for further management and syncope evaluation. ENT repaired lacerations. Got unasyn  for 3 days. Cardiology consulted advised ILR. CIR following.    Assessment and Plan: Syncope and collapse- Patient with history of HFpEF, A-fib, flutter, mitral, aortic valvular replacement, carotid artery stenosis. Echocardiogram EF 70 to 75%, no MR, no AI. EP follow up appreciated, ILR placed Continue to hold beta-blocker. Avoid QT prolonging drugs. Etiology not clear. PT/ OT, CIR following.   Head injury Orbital floor fracture, nasal bone fracture- Seen by ENT Dr. Tobie, repaired laceration injuries. Also seen by ophthalmology. Continue IV Unasyn  therapy total 3 days. Encourage out of bed, PT. Patient reported back pain as well as shoulder pain.  X-ray of the thoracic and  lumbar spine as well as right shoulder were negative for any acute abnormality.   Acute intra parenchymal hemorrhage- Seen by neurology Dr. Onetha. Repeat MRI reviewed by neurosurgery, stable. No surgical intervention.   Cervical spine MRI unremarkable.  Cervical collar removed. Discussed with Dr. Onetha, initiate warfarin 72 hours after initial injury.   Hypokalemia- Corrected.   Chronic HFpEF: No exacerbation. Echo reviewed shows right ventricular systolic function is moderately reduced. Continue torsemide , Farxiga , losartan .   Paroxysmal A-fib/flutter First-degree AV block Continue amiodarone . Hold Coumadin , resume tomorrow. Beta-blocker on hold.   Essential hypertension: Continue losartan , torsemide , Farxiga .   CKD stage IIIb: Baseline creatinine around 1.7. Avoid nephrotoxic drugs.   Hypothyroidism:  continue home dose Synthroid  resumed.   GERD- Continue PPI   Subjective: Reports pain is well-controlled.  Reports some back pain as well as shoulder pain.  No nausea no vomiting.  Had a BM.  Physical Exam: Clear to auscultation. S1-S2 present. Bowel sound present.  No edema.Multiple bruising seen on the facial area with swelling.  Data Reviewed: I have Reviewed nursing notes, Vitals, and Lab results. Since last encounter, pertinent lab results CBC and BMP   . I have ordered test including CBC and BMP x-ray spine lumbar and thoracic x-ray shoulder  . I have discussed pt's care plan and test results with neurosurgery  .   Disposition: Status is: Inpatient Remains inpatient appropriate because: Monitor for improvement in pain control and arrangement for rehab  SCDs Start: 06/28/24 1633   Family Communication: No one at bedside Level of care: Progressive   Vitals:   07/03/24 0400 07/03/24 0731 07/03/24 1235 07/03/24 1515  BP:  (!) 136/59 (!) 106/45 (!) 120/51  Pulse: 62 65 65 67  Resp: 13 11 11  15  Temp:  97.6 F (36.4 C) 98.6 F (37 C) 98.3 F (36.8 C)   TempSrc:  Oral Oral Oral  SpO2: 96% 92% 98% 99%  Weight:      Height:         Author: Yetta Blanch, MD 07/03/2024 7:05 PM  Please look on [email or web address] to find out who is on call.

## 2024-07-03 NOTE — Plan of Care (Signed)
  Problem: Education: Goal: Ability to describe self-care measures that may prevent or decrease complications (Diabetes Survival Skills Education) will improve Outcome: Progressing Goal: Individualized Educational Video(s) Outcome: Progressing   Problem: Coping: Goal: Ability to adjust to condition or change in health will improve Outcome: Progressing   Problem: Fluid Volume: Goal: Ability to maintain a balanced intake and output will improve Outcome: Progressing   Problem: Health Behavior/Discharge Planning: Goal: Ability to identify and utilize available resources and services will improve Outcome: Progressing Goal: Ability to manage health-related needs will improve Outcome: Progressing   Problem: Metabolic: Goal: Ability to maintain appropriate glucose levels will improve Outcome: Progressing   Problem: Nutritional: Goal: Maintenance of adequate nutrition will improve Outcome: Progressing Goal: Progress toward achieving an optimal weight will improve Outcome: Progressing   Problem: Skin Integrity: Goal: Risk for impaired skin integrity will decrease Outcome: Progressing   Problem: Education: Goal: Knowledge of General Education information will improve Description: Including pain rating scale, medication(s)/side effects and non-pharmacologic comfort measures Outcome: Progressing   Problem: Health Behavior/Discharge Planning: Goal: Ability to manage health-related needs will improve Outcome: Progressing   Problem: Clinical Measurements: Goal: Ability to maintain clinical measurements within normal limits will improve Outcome: Progressing Goal: Will remain free from infection Outcome: Progressing Goal: Diagnostic test results will improve Outcome: Progressing Goal: Respiratory complications will improve Outcome: Progressing Goal: Cardiovascular complication will be avoided Outcome: Progressing   Problem: Activity: Goal: Risk for activity intolerance will  decrease Outcome: Progressing   Problem: Nutrition: Goal: Adequate nutrition will be maintained Outcome: Progressing   Problem: Coping: Goal: Level of anxiety will decrease Outcome: Progressing   Problem: Elimination: Goal: Will not experience complications related to bowel motility Outcome: Progressing Goal: Will not experience complications related to urinary retention Outcome: Progressing   Problem: Pain Managment: Goal: General experience of comfort will improve and/or be controlled Outcome: Progressing   Problem: Safety: Goal: Ability to remain free from injury will improve Outcome: Progressing   Problem: Skin Integrity: Goal: Risk for impaired skin integrity will decrease Outcome: Progressing

## 2024-07-04 ENCOUNTER — Inpatient Hospital Stay (HOSPITAL_COMMUNITY)

## 2024-07-04 DIAGNOSIS — S06321A Contusion and laceration of left cerebrum with loss of consciousness of 30 minutes or less, initial encounter: Secondary | ICD-10-CM | POA: Diagnosis not present

## 2024-07-04 DIAGNOSIS — M4804 Spinal stenosis, thoracic region: Secondary | ICD-10-CM | POA: Diagnosis not present

## 2024-07-04 DIAGNOSIS — I611 Nontraumatic intracerebral hemorrhage in hemisphere, cortical: Secondary | ICD-10-CM | POA: Diagnosis not present

## 2024-07-04 DIAGNOSIS — M19011 Primary osteoarthritis, right shoulder: Secondary | ICD-10-CM | POA: Diagnosis not present

## 2024-07-04 DIAGNOSIS — Z043 Encounter for examination and observation following other accident: Secondary | ICD-10-CM | POA: Diagnosis not present

## 2024-07-04 DIAGNOSIS — M48061 Spinal stenosis, lumbar region without neurogenic claudication: Secondary | ICD-10-CM | POA: Diagnosis not present

## 2024-07-04 DIAGNOSIS — M47814 Spondylosis without myelopathy or radiculopathy, thoracic region: Secondary | ICD-10-CM | POA: Diagnosis not present

## 2024-07-04 DIAGNOSIS — M549 Dorsalgia, unspecified: Secondary | ICD-10-CM | POA: Diagnosis not present

## 2024-07-04 DIAGNOSIS — S0232XA Fracture of orbital floor, left side, initial encounter for closed fracture: Secondary | ICD-10-CM | POA: Diagnosis not present

## 2024-07-04 DIAGNOSIS — M47816 Spondylosis without myelopathy or radiculopathy, lumbar region: Secondary | ICD-10-CM | POA: Diagnosis not present

## 2024-07-04 LAB — CBC
HCT: 33.4 % — ABNORMAL LOW (ref 36.0–46.0)
Hemoglobin: 10.1 g/dL — ABNORMAL LOW (ref 12.0–15.0)
MCH: 26.7 pg (ref 26.0–34.0)
MCHC: 30.2 g/dL (ref 30.0–36.0)
MCV: 88.4 fL (ref 80.0–100.0)
Platelets: 136 K/uL — ABNORMAL LOW (ref 150–400)
RBC: 3.78 MIL/uL — ABNORMAL LOW (ref 3.87–5.11)
RDW: 16.6 % — ABNORMAL HIGH (ref 11.5–15.5)
WBC: 5.6 K/uL (ref 4.0–10.5)
nRBC: 0 % (ref 0.0–0.2)

## 2024-07-04 LAB — BASIC METABOLIC PANEL WITH GFR
Anion gap: 9 (ref 5–15)
BUN: 13 mg/dL (ref 8–23)
CO2: 32 mmol/L (ref 22–32)
Calcium: 8.3 mg/dL — ABNORMAL LOW (ref 8.9–10.3)
Chloride: 96 mmol/L — ABNORMAL LOW (ref 98–111)
Creatinine, Ser: 1.28 mg/dL — ABNORMAL HIGH (ref 0.44–1.00)
GFR, Estimated: 42 mL/min — ABNORMAL LOW (ref >60)
Glucose, Bld: 102 mg/dL — ABNORMAL HIGH (ref 70–99)
Potassium: 3.8 mmol/L (ref 3.5–5.1)
Sodium: 137 mmol/L (ref 135–145)

## 2024-07-04 LAB — GLUCOSE, CAPILLARY
Glucose-Capillary: 102 mg/dL — ABNORMAL HIGH (ref 70–99)
Glucose-Capillary: 107 mg/dL — ABNORMAL HIGH (ref 70–99)
Glucose-Capillary: 124 mg/dL — ABNORMAL HIGH (ref 70–99)
Glucose-Capillary: 125 mg/dL — ABNORMAL HIGH (ref 70–99)
Glucose-Capillary: 126 mg/dL — ABNORMAL HIGH (ref 70–99)
Glucose-Capillary: 166 mg/dL — ABNORMAL HIGH (ref 70–99)
Glucose-Capillary: 170 mg/dL — ABNORMAL HIGH (ref 70–99)

## 2024-07-04 LAB — PROTIME-INR
INR: 1.2 (ref 0.8–1.2)
Prothrombin Time: 16.2 s — ABNORMAL HIGH (ref 11.4–15.2)

## 2024-07-04 LAB — MAGNESIUM: Magnesium: 2 mg/dL (ref 1.7–2.4)

## 2024-07-04 NOTE — Progress Notes (Signed)
 [facility] Progress Note Patient: [name] FMW:[id number] DOB: Mar 02, 1946  DOA: 06/28/2024 DOS: the patient was seen and examined on 07/04/2024  Brief Hospital Course: [name] is a 78 y.o. female with PMH of HFpEF, A-fib/flutter on warfarin, rheumatic heart disease, AV and MV replacement, COPD, CVA, carotid artery stenosis, CKD-3B, NIDDM-2, hypothyroidism, lumbar laminectomy and decompression and mild cognitive impairment brought to ED by EMS after syncopal episode.    Patient sustained facial injury, CT head cervical and maxillofacial showed 7 mm focus of acute IPH over the left frontal white matter, minimal acute SAH over the distal right frontal lobe, depressed comminuted left orbital floor fracture with bilateral entrapment of inferior rectus muscle, fracture of medial wall of the left orbit without entrapment of intraorbital contents, minimally displaced nasal bone fracture as well as fracture of junction of left nasal bone to the frontal process of left maxilla. Neurosurgery consulted recommended to hold anticoagulation, syncope workup.  ENT and ophthalmology evaluated the patient.  Admitted to [facility] service for further management and syncope evaluation. ENT repaired lacerations. Got unasyn  for 3 days. Cardiology consulted advised ILR.  Assessment and Plan: Syncope and collapse Presented with a witnessed syncopal event with loss of consciousness and head trauma. Found to have IPH and contusion likely due to trauma, but no evidence of stroke or other etiologies to explain syncope on MRI brain. No evidence of reoccurrence of seizures here. Preserved EF on echocardiogram, no significant valvular abnormality. Cardiology/EP was consulted, ILR was recommended and placed. Concern for recurrence of A-fib   Orbital floor fracture, nasal bone fracture Left inferior rectus muscle entrapment. CT maxillofacial Depressed comminuted left orbital floor fracture with partial entrapment of  inferior rectus muscle. Fracture of the medial wall the left orbit without entrapment of intraorbital contents. Minimally displaced nasal bone fracture as well as fracture of the junction of the left nasal bone to the frontal process of the left maxilla. seen by ENT Dr. Tobie, repaired laceration injuries. Also seen by ophthalmology. Treated with Unasyn . Patient reported back pain as well as shoulder pain.  X-ray of the thoracic and lumbar spine as well as right shoulder were negative for any acute abnormality.  ENT recommending Peridex  twice daily, Augmentin twice daily 7-day, bacitracin  twice daily 7-day.  EOT 10/2. No nose blowing for 2 weeks, as needed Afrin for epistaxis Soft diet for 5-day and then advance to regular. ENT follow-up in 2 to 3 weeks. Ophthalmology Dr. Corbin recommended outpatient follow-up.  Suspect entrapment is unlikely by clinical exam.   Acute intra parenchymal hemorrhage on warfarin. Surrounding cerebral edema Seen by neurosurgery Dr. Onetha. Repeat MRI reviewed by neurosurgery, stable. No surgical intervention.   Cervical spine MRI unremarkable.  Cervical collar removed. Discussed with Dr. Onetha, initiate warfarin 72 hours after initial injury. Repeat CT scan performed on 10/2 due to patient complaining of worsening headache which shows no new areas of intracranial hemorrhage and expected evolution of the prior hemorrhages seen. Will monitor overnight and for stability and if remains stable neurologically, will discuss about resumption of Coumadin  tomorrow.   Hypokalemia- Corrected.   Chronic HFpEF Chronic RV systolic dysfunction. Rheumatic valvular disease including MS/AI/AS s/p pericardial MVR, pericardial AVR 2014  Appears euvolemic. Current and prior echo shows right ventricular systolic function is moderately reduced. Continue torsemide , Farxiga .  Losartan , Bisoprolol  on hold.  Paroxysmal A-fib/flutter First-degree AV block Prolonged QT-last QTc 459  on 9/28. Continue amiodarone . Hold Coumadin , will resume today based on discussion with Dr. Onetha. Beta-blocker on  hold.   Essential hypertension Continue torsemide , Farxiga . Losartan , Bisoprolol  on hold.   CKD stage IIIb: Baseline creatinine around 1.5.  On admission serum creatinine 1.7 Now trending down below baseline. Avoid nephrotoxic drugs.   Hypothyroidism:  continue home dose Synthroid  resumed.   GERD- Continue PPI  Type 2 diabetes mellitus, uncontrolled with hyperglycemia with CKD without long-term insulin  use. Hemoglobin A1c 7.4 in May. CBG remains elevated. On Farxiga  at home.  Currently on sliding scale insulin .   Subjective: No nausea no vomiting.  Reports left frontal headache.  No fever no chills.  No other focal deficit reported by the patient.  Physical Exam: Clear to auscultation S1-S2 present No pronator drift.  No asterixis Pupils are equal and reactive to light.  Data Reviewed: I have Reviewed nursing notes, Vitals, and Lab results. Since last encounter, pertinent lab results CBC and BMP   . I have ordered test including CBC BMP  . I have ordered imaging CT head  .   Disposition: Status is: Inpatient Remains inpatient appropriate because: Monitor for improvement in pain control and tolerance of warfarin  SCDs Start: 06/28/24 1633   Family Communication: Family at bedside  Level of care: Progressive   Vitals:   07/04/24 0751 07/04/24 0752 07/04/24 1155 07/04/24 1535  BP:   (!) 134/45 130/88  Pulse:   63 65  Resp:   12 13  Temp:   98.4 F (36.9 C) 98.7 F (37.1 C)  TempSrc:   Oral Oral  SpO2: 94% 100% 90% 100%  Weight:      Height:         Author: Yetta Blanch, MD 07/04/2024 7:29 PM  Please look on [email or web address] to find out who is on call.

## 2024-07-04 NOTE — Progress Notes (Signed)
 Inpatient Rehab Admissions Coordinator:   [employer] contacted with intent to deny.  Peer to peer discussion complete and [employer] MD felt pt could be managed in a lower level of care.  This goes against CMS guidelines which does not impose a less intensive setting standard.  I spoke to pt's spouse on the phone and they would like to appeal this decision and I will start that process today.    Reche Lowers, PT, DPT Admissions Coordinator [phone] 07/04/24  12:10 PM

## 2024-07-04 NOTE — Care Management Important Message (Signed)
 Important Message  Patient Details  Name: [name] MRN: [id number] Date of Birth: [birth year]   Important Message Given:  Yes - Medicare IM     Jon Cruel 07/04/2024, 9:47 AM

## 2024-07-04 NOTE — Plan of Care (Signed)

## 2024-07-05 ENCOUNTER — Other Ambulatory Visit: Payer: Self-pay | Admitting: Cardiology

## 2024-07-05 DIAGNOSIS — S06321A Contusion and laceration of left cerebrum with loss of consciousness of 30 minutes or less, initial encounter: Secondary | ICD-10-CM | POA: Diagnosis not present

## 2024-07-05 LAB — GLUCOSE, CAPILLARY
Glucose-Capillary: 119 mg/dL — ABNORMAL HIGH (ref 70–99)
Glucose-Capillary: 149 mg/dL — ABNORMAL HIGH (ref 70–99)
Glucose-Capillary: 156 mg/dL — ABNORMAL HIGH (ref 70–99)
Glucose-Capillary: 220 mg/dL — ABNORMAL HIGH (ref 70–99)
Glucose-Capillary: 140 mg/dL — ABNORMAL HIGH (ref 70–99)
Glucose-Capillary: 122 mg/dL — ABNORMAL HIGH (ref 70–99)

## 2024-07-05 LAB — CBC
HCT: 34.3 % — ABNORMAL LOW (ref 36.0–46.0)
Hemoglobin: 10.3 g/dL — ABNORMAL LOW (ref 12.0–15.0)
MCH: 26.6 pg (ref 26.0–34.0)
MCHC: 30 g/dL (ref 30.0–36.0)
MCV: 88.6 fL (ref 80.0–100.0)
Platelets: 130 K/uL — ABNORMAL LOW (ref 150–400)
RBC: 3.87 MIL/uL (ref 3.87–5.11)
RDW: 16.8 % — ABNORMAL HIGH (ref 11.5–15.5)
WBC: 5.4 K/uL (ref 4.0–10.5)
nRBC: 0 % (ref 0.0–0.2)

## 2024-07-05 LAB — BASIC METABOLIC PANEL WITH GFR
Anion gap: 10 (ref 5–15)
BUN: 15 mg/dL (ref 8–23)
CO2: 31 mmol/L (ref 22–32)
Calcium: 8.7 mg/dL — ABNORMAL LOW (ref 8.9–10.3)
Chloride: 99 mmol/L (ref 98–111)
Creatinine, Ser: 1.32 mg/dL — ABNORMAL HIGH (ref 0.44–1.00)
GFR, Estimated: 41 mL/min — ABNORMAL LOW (ref >60)
Glucose, Bld: 111 mg/dL — ABNORMAL HIGH (ref 70–99)
Potassium: 3.3 mmol/L — ABNORMAL LOW (ref 3.5–5.1)
Sodium: 140 mmol/L (ref 135–145)

## 2024-07-05 LAB — MAGNESIUM: Magnesium: 2.1 mg/dL (ref 1.7–2.4)

## 2024-07-05 MED ORDER — ATORVASTATIN CALCIUM 40 MG PO TABS: 40.0000 mg | ORAL_TABLET | Freq: Every day | ORAL | 3 refills | Status: AC

## 2024-07-05 MED ORDER — ALUM & MAG HYDROXIDE-SIMETH 200-200-20 MG/5ML PO SUSP
30.0000 mL | Freq: Four times a day (QID) | ORAL | Status: DC | PRN
  Administered 2024-07-05: 30 mL via ORAL
  Filled 2024-07-05: qty 30

## 2024-07-05 MED ORDER — POTASSIUM CHLORIDE CRYS ER 20 MEQ PO TBCR
40.0000 meq | EXTENDED_RELEASE_TABLET | ORAL | Status: AC
  Administered 2024-07-05 (×2): 40 meq via ORAL
  Filled 2024-07-05 (×2): qty 2

## 2024-07-05 MED ORDER — HEPARIN SODIUM (PORCINE) 5000 UNIT/ML IJ SOLN: 5000.0000 [IU] | Freq: Two times a day (BID) | INTRAMUSCULAR | Status: DC

## 2024-07-05 MED ORDER — HEPARIN SODIUM (PORCINE) 5000 UNIT/ML IJ SOLN
5000.0000 [IU] | Freq: Three times a day (TID) | INTRAMUSCULAR | Status: DC
  Administered 2024-07-05 – 2024-07-06 (×4): 5000 [IU] via SUBCUTANEOUS
  Filled 2024-07-05 (×4): qty 1

## 2024-07-05 NOTE — Progress Notes (Signed)
 [facility] Progress Note Patient: [name] FMW:[id number] DOB: 10/15/1945  DOA: 06/28/2024 DOS: the patient was seen and examined on 07/05/2024  Brief Hospital Course: [name] is a 78 y.o. female with PMH of HFpEF, A-fib/flutter on warfarin, rheumatic heart disease, AV and MV replacement, COPD, CVA, carotid artery stenosis, CKD-3B, NIDDM-2, hypothyroidism, lumbar laminectomy and decompression and mild cognitive impairment brought to ED by EMS after syncopal episode.    Patient sustained facial injury, CT head cervical and maxillofacial showed 7 mm focus of acute IPH over the left frontal white matter, minimal acute SAH over the distal right frontal lobe, depressed comminuted left orbital floor fracture with bilateral entrapment of inferior rectus muscle, fracture of medial wall of the left orbit without entrapment of intraorbital contents, minimally displaced nasal bone fracture as well as fracture of junction of left nasal bone to the frontal process of left maxilla. Neurosurgery consulted recommended to hold anticoagulation, syncope workup.  ENT and ophthalmology evaluated the patient.  Admitted to [facility] service for further management and syncope evaluation. ENT repaired lacerations. Cardiology placed ILR.  Assessment and Plan: Syncope and collapse Presented with a witnessed syncopal event with loss of consciousness and head trauma. Found to have IPH and contusion likely due to trauma, but no evidence of stroke or other etiologies to explain syncope on MRI brain. No evidence of reoccurrence of seizures here. Preserved EF on echocardiogram, no significant valvular abnormality. Cardiology/EP was consulted, ILR was recommended and placed. Concern for recurrence of A-fib as a cause of her syncope.   Orbital floor fracture, nasal bone fracture Left inferior rectus muscle entrapment. CT maxillofacial Depressed comminuted left orbital floor fracture with partial entrapment of  inferior rectus muscle. Fracture of the medial wall the left orbit without entrapment of intraorbital contents. Minimally displaced nasal bone fracture as well as fracture of the junction of the left nasal bone to the frontal process of the left maxilla. seen by ENT Dr. Tobie, repaired laceration injuries. Also seen by ophthalmology. Treated with Unasyn . Patient reported back pain as well as shoulder pain.  X-ray of the thoracic and lumbar spine as well as right shoulder were negative for any acute abnormality.  ENT recommending Peridex  twice daily, Augmentin twice daily 7-day, bacitracin  twice daily 7-day.  EOT 10/2. No nose blowing for 2 weeks, as needed Afrin for epistaxis Soft diet for 5-day and then advance to regular. ENT follow-up in 2 to 3 weeks. Ophthalmology Dr. Corbin recommended outpatient follow-up.  Suspect entrapment is unlikely by clinical exam.   Acute intra parenchymal hemorrhage on warfarin. Surrounding cerebral edema Seen by neurosurgery Dr. Onetha. Repeat MRI reviewed by neurosurgery, stable. No surgical intervention.   Cervical spine MRI unremarkable.  Cervical collar removed. Discussed with Dr. Onetha, initiate warfarin 72 hours after initial injury. Repeat CT scan performed on 10/2 due to patient complaining of worsening headache which shows no new areas of intracranial hemorrhage and expected evolution of the prior hemorrhages seen. Will initiate DVT prophylaxis and monitor.    Hypokalemia Corrected.   Chronic HFpEF Chronic RV systolic dysfunction. Rheumatic valvular disease including MS/AI/AS s/p pericardial MVR, pericardial AVR 2014  Appears euvolemic. Current and prior echo shows right ventricular systolic function is moderately reduced. Continue torsemide , Farxiga .  Losartan , Bisoprolol  on hold.  Paroxysmal A-fib/flutter First-degree AV block Prolonged QT-last QTc 459 on 9/28. Continue amiodarone . Hold Coumadin  Beta-blocker on hold.   Essential  hypertension Continue torsemide , Farxiga . Losartan , Bisoprolol  on hold.   CKD stage IIIb: Baseline creatinine around  1.5.  On admission serum creatinine 1.7 Now trending down below baseline. Avoid nephrotoxic drugs.   Hypothyroidism:  continue home dose Synthroid  resumed.   GERD Continue PPI  Type 2 diabetes mellitus, uncontrolled with hyperglycemia with CKD without long-term insulin  use. Hemoglobin A1c 7.4 in May. CBG remains elevated. On Farxiga  at home.  Currently on sliding scale insulin .   Subjective: No nausea no vomiting.  Headache resolved.  No fever no chills.  Physical Exam: Clear to auscultation. S1-S2 present Appears more alert and oriented today.  Able to follow commands better. Facial swelling appears to be improving.  Bruising unchanged.  No focal deficit.  Data Reviewed: I have Reviewed nursing notes, Vitals, and Lab results. Since last encounter, pertinent lab results CBC and BMP   . I have ordered test including CBC and BMP  .   Disposition: Status is: Inpatient Remains inpatient appropriate because: Monitor for improvement in pain control and tolerance of anticoagulation  heparin  injection 5,000 Units Start: 07/05/24 1600 SCDs Start: 06/28/24 1633   Family Communication: Husband at bedside Level of care: Progressive   Vitals:   07/05/24 0940 07/05/24 1124 07/05/24 1153 07/05/24 1649  BP:  (!) 147/60  121/84  Pulse:  69  76  Resp:  13  16  Temp:  98.6 F (37 C)  98.6 F (37 C)  TempSrc:  Oral  Oral  SpO2: 100% 98% 99% 97%  Weight:      Height:         Author: Yetta Blanch, MD 07/05/2024 6:11 PM  Please look on [email or web address] to find out who is on call.

## 2024-07-05 NOTE — Progress Notes (Addendum)
 Physical Therapy Treatment Patient Details Name: [name] MRN: [id number] DOB: 27-Jan-1946 Today's Date: 07/05/2024   History of Present Illness Pt is an 78 y.o. female presenting 9/26 after ground level fall. MRI brain with 1.2 cm acute intraparenchymal hemorrhage at the subcortical L frontal lobe, likely a small hemorrhagic contusion; intraventricular hemorrhage layering within the occipital horns of both lateral ventricles. L orbital fx PMH: HFpEF, A-fib/flutter on warfarin, rheumatic heart disease, AV and MV replacement, COPD, CVA, carotid artery stenosis, CKD-3B, NIDDM-2, hypothyroidism, lumbar laminectomy and decompression and mild cognitive impairment    PT Comments  Pt tolerates treatment well, ambulating for increased distances during session. Pt does not report dizziness during ambulation this session but does report fatigue. Pt continues to require supplemental oxygen  to maintain oxygen  saturation at rest and when mobilizing. Pt also demonstrates difficulty retaining cues for hand placement to improve transfer quality, unsure if this extent of memory deficits is baseline or new since fall. Patient will benefit from intensive inpatient follow-up therapy, >3 hours/day.    If plan is discharge home, recommend the following: Assistance with cooking/housework;Direct supervision/assist for medications management;Direct supervision/assist for financial management;Assist for transportation;Help with stairs or ramp for entrance;Supervision due to cognitive status;A little help with walking and/or transfers;A little help with bathing/dressing/bathroom   Can travel by private vehicle        Equipment Recommendations  Rolling walker (2 wheels)    Recommendations for Other Services       Precautions / Restrictions Precautions Precautions: Fall Recall of Precautions/Restrictions: Impaired Precaution/Restrictions Comments: ?syncope prior to fall; husband uncertain. Restrictions Weight  Bearing Restrictions Per Provider Order: No     Mobility  Bed Mobility Overal bed mobility: Needs Assistance Bed Mobility: Sit to Supine       Sit to supine: Supervision        Transfers Overall transfer level: Needs assistance Equipment used: Rolling walker (2 wheels) Transfers: Sit to/from Stand Sit to Stand: Contact guard assist           General transfer comment: pt requires verbal cues for hand placement during transfers. pt with poor retention of cues between transfer attempts    Ambulation/Gait Ambulation/Gait assistance: Contact guard assist Gait Distance (Feet): 70 Feet (additional trial of 40') Assistive device: Rolling walker (2 wheels) Gait Pattern/deviations: Step-to pattern, Trunk flexed Gait velocity: reduced Gait velocity interpretation: <1.31 ft/sec, indicative of household ambulator   General Gait Details: pt with slowed step-to gait, increased time for turns   Comptroller Bed    Modified Rankin (Stroke Patients Only)       Balance Overall balance assessment: Needs assistance Sitting-balance support: No upper extremity supported, Feet supported Sitting balance-Leahy Scale: Good     Standing balance support: Single extremity supported, Reliant on assistive device for balance Standing balance-Leahy Scale: Poor                              Communication Communication Communication: No apparent difficulties  Cognition Arousal: Alert Behavior During Therapy: Flat affect   PT - Cognitive impairments: History of cognitive impairments, Memory                       PT - Cognition Comments: per spouse pt has had memory problems since her CVA Following commands: Intact      Cueing Cueing  Techniques: Verbal cues, Tactile cues, Visual cues  Exercises      General Comments General comments (skin integrity, edema, etc.): pt on 1L [place] upon PT arrival, pt desats to 86% with  attempts to wean to room air and requires 1L  to maintain sats at or above 92% when mobilizing      Pertinent Vitals/Pain Pain Assessment Pain Assessment: No/denies pain    Home Living                          Prior Function            PT Goals (current goals can now be found in the care plan section) Acute Rehab PT Goals Patient Stated Goal: feel better Progress towards PT goals: Progressing toward goals    Frequency    Min 2X/week      PT Plan      Co-evaluation              AM-PAC PT 6 Clicks Mobility   Outcome Measure  Help needed turning from your back to your side while in a flat bed without using bedrails?: A Little Help needed moving from lying on your back to sitting on the side of a flat bed without using bedrails?: A Little Help needed moving to and from a bed to a chair (including a wheelchair)?: A Little Help needed standing up from a chair using your arms (e.g., wheelchair or bedside chair)?: A Little Help needed to walk in hospital room?: A Little Help needed climbing 3-5 steps with a railing? : A Lot 6 Click Score: 17    End of Session Equipment Utilized During Treatment: Gait belt;Oxygen  Activity Tolerance: Patient limited by fatigue Patient left: in bed;with call bell/phone within reach;with bed alarm set Nurse Communication: Mobility status PT Visit Diagnosis: Unsteadiness on feet (R26.81);History of falling (Z91.81);Pain Pain - Right/Left: Left     Time: 8687-8664 PT Time Calculation (min) (ACUTE ONLY): 23 min  Charges:    $Gait Training: 23-37 mins PT General Charges $$ ACUTE PT VISIT: 1 Visit                     [name], PT, DPT Acute Rehabilitation Office [phone]    [name] 07/05/2024, 1:46 PM

## 2024-07-05 NOTE — Progress Notes (Signed)
 Inpatient Rehab Admissions Coordinator:   Awaiting appeal determination from [facility].  Will follow.    Reche Lowers, PT, DPT Admissions Coordinator [phone] 07/05/24  10:47 AM

## 2024-07-06 ENCOUNTER — Inpatient Hospital Stay (HOSPITAL_COMMUNITY)

## 2024-07-06 DIAGNOSIS — S0990XA Unspecified injury of head, initial encounter: Secondary | ICD-10-CM | POA: Diagnosis not present

## 2024-07-06 DIAGNOSIS — S06321A Contusion and laceration of left cerebrum with loss of consciousness of 30 minutes or less, initial encounter: Secondary | ICD-10-CM | POA: Diagnosis not present

## 2024-07-06 LAB — CBC
HCT: 36.7 % (ref 36.0–46.0)
Hemoglobin: 10.9 g/dL — ABNORMAL LOW (ref 12.0–15.0)
MCH: 26.4 pg (ref 26.0–34.0)
MCHC: 29.7 g/dL — ABNORMAL LOW (ref 30.0–36.0)
MCV: 88.9 fL (ref 80.0–100.0)
Platelets: 147 K/uL — ABNORMAL LOW (ref 150–400)
RBC: 4.13 MIL/uL (ref 3.87–5.11)
RDW: 17.4 % — ABNORMAL HIGH (ref 11.5–15.5)
WBC: 5.3 K/uL (ref 4.0–10.5)
nRBC: 0 % (ref 0.0–0.2)

## 2024-07-06 LAB — BASIC METABOLIC PANEL WITH GFR
Anion gap: 10 (ref 5–15)
BUN: 16 mg/dL (ref 8–23)
CO2: 31 mmol/L (ref 22–32)
Calcium: 9 mg/dL (ref 8.9–10.3)
Chloride: 97 mmol/L — ABNORMAL LOW (ref 98–111)
Creatinine, Ser: 1.43 mg/dL — ABNORMAL HIGH (ref 0.44–1.00)
GFR, Estimated: 37 mL/min — ABNORMAL LOW (ref >60)
Glucose, Bld: 143 mg/dL — ABNORMAL HIGH (ref 70–99)
Potassium: 4.1 mmol/L (ref 3.5–5.1)
Sodium: 138 mmol/L (ref 135–145)

## 2024-07-06 LAB — GLUCOSE, CAPILLARY
Glucose-Capillary: 120 mg/dL — ABNORMAL HIGH (ref 70–99)
Glucose-Capillary: 125 mg/dL — ABNORMAL HIGH (ref 70–99)
Glucose-Capillary: 203 mg/dL — ABNORMAL HIGH (ref 70–99)
Glucose-Capillary: 158 mg/dL — ABNORMAL HIGH (ref 70–99)
Glucose-Capillary: 156 mg/dL — ABNORMAL HIGH (ref 70–99)
Glucose-Capillary: 133 mg/dL — ABNORMAL HIGH (ref 70–99)

## 2024-07-06 LAB — MAGNESIUM: Magnesium: 2.3 mg/dL (ref 1.7–2.4)

## 2024-07-06 MED ORDER — WARFARIN SODIUM 4 MG PO TABS
4.0000 mg | ORAL_TABLET | Freq: Once | ORAL | Status: AC
  Administered 2024-07-06: 4 mg via ORAL
  Filled 2024-07-06: qty 1

## 2024-07-06 MED ORDER — INSULIN ASPART 100 UNIT/ML IJ SOLN
0.0000 [IU] | Freq: Three times a day (TID) | INTRAMUSCULAR | Status: DC
  Administered 2024-07-06: 3 [IU] via SUBCUTANEOUS
  Administered 2024-07-06: 5 [IU] via SUBCUTANEOUS
  Administered 2024-07-07: 2 [IU] via SUBCUTANEOUS
  Administered 2024-07-07 – 2024-07-08 (×4): 3 [IU] via SUBCUTANEOUS
  Administered 2024-07-08: 2 [IU] via SUBCUTANEOUS
  Administered 2024-07-09 (×2): 3 [IU] via SUBCUTANEOUS
  Administered 2024-07-10: 2 [IU] via SUBCUTANEOUS
  Administered 2024-07-10: 5 [IU] via SUBCUTANEOUS

## 2024-07-06 MED ORDER — WARFARIN - PHARMACIST DOSING INPATIENT: Freq: Every day | Status: DC

## 2024-07-06 MED ORDER — ORAL CARE MOUTH RINSE: 15.0000 mL | OROMUCOSAL | Status: DC | PRN

## 2024-07-06 MED ORDER — INSULIN ASPART 100 UNIT/ML IJ SOLN: 0.0000 [IU] | Freq: Every day | INTRAMUSCULAR | Status: DC

## 2024-07-06 NOTE — Progress Notes (Signed)
 Patient is off unit to CT

## 2024-07-06 NOTE — Progress Notes (Signed)
 [facility] Progress Note Patient: [name] FMW:[id number] DOB: September 02, 1946  DOA: 06/28/2024 DOS: the patient was seen and examined on 07/06/2024  Brief Hospital Course: [name] is a 78 y.o. female with PMH of HFpEF, A-fib/flutter on warfarin, rheumatic heart disease, AV and MV replacement, COPD, CVA, carotid artery stenosis, CKD-3B, NIDDM-2, hypothyroidism, lumbar laminectomy and decompression and mild cognitive impairment brought to ED by EMS after syncopal episode.    Patient sustained facial injury, CT head cervical and maxillofacial showed 7 mm focus of acute IPH over the left frontal white matter, minimal acute SAH over the distal right frontal lobe, depressed comminuted left orbital floor fracture with bilateral entrapment of inferior rectus muscle, fracture of medial wall of the left orbit without entrapment of intraorbital contents, minimally displaced nasal bone fracture as well as fracture of junction of left nasal bone to the frontal process of left maxilla. Neurosurgery consulted recommended to hold anticoagulation, syncope workup.  ENT and ophthalmology evaluated the patient.  Admitted to [facility] service for further management and syncope evaluation. ENT repaired lacerations. Cardiology placed ILR.  Assessment and Plan: Syncope and collapse Presented with a witnessed syncopal event with loss of consciousness and head trauma. Found to have IPH and contusion likely due to trauma, but no evidence of stroke or other etiologies to explain syncope on MRI brain. No evidence of reoccurrence of seizures here. Preserved EF on echocardiogram, no significant valvular abnormality. Cardiology/EP was consulted, ILR was recommended and placed. Concern for recurrence of A-fib as a cause of her syncope.   Orbital floor fracture, nasal bone fracture Left inferior rectus muscle entrapment. CT maxillofacial Depressed comminuted left orbital floor fracture with partial entrapment of  inferior rectus muscle. Fracture of the medial wall the left orbit without entrapment of intraorbital contents. Minimally displaced nasal bone fracture as well as fracture of the junction of the left nasal bone to the frontal process of the left maxilla. seen by ENT Dr. Tobie, repaired laceration injuries. Also seen by ophthalmology. Treated with Unasyn . Patient reported back pain as well as shoulder pain.  X-ray of the thoracic and lumbar spine as well as right shoulder were negative for any acute abnormality.  ENT recommending Peridex  twice daily, Augmentin twice daily 7-day, bacitracin  twice daily 7-day.  EOT 10/2. No nose blowing for 2 weeks, as needed Afrin for epistaxis Soft diet for 5-day and then advance to regular. ENT follow-up in 2 to 3 weeks. Ophthalmology Dr. Corbin recommended outpatient follow-up.  Suspect entrapment is unlikely by clinical exam.   Acute intra parenchymal hemorrhage on warfarin. Surrounding cerebral edema Seen by neurosurgery Dr. Onetha. Repeat MRI reviewed by neurosurgery, stable. No surgical intervention.   Cervical spine MRI unremarkable.  Cervical collar removed. Discussed with Dr. Onetha, initiate warfarin 72 hours after initial injury. Repeat CT scan performed on 10/2 due to patient complaining of worsening headache which shows no new areas of intracranial hemorrhage and expected evolution of the prior hemorrhages seen. Due to prophylaxis now on hold again.   Hypokalemia Corrected.   Chronic HFpEF Chronic RV systolic dysfunction. Rheumatic valvular disease including MS/AI/AS s/p pericardial MVR, pericardial AVR 2014  Appears euvolemic. Current and prior echo shows right ventricular systolic function is moderately reduced. Continue torsemide , Farxiga .  Losartan , Bisoprolol  on hold.  Paroxysmal A-fib/flutter First-degree AV block Prolonged QT-last QTc 459 on 9/28. Continue amiodarone . Hold Coumadin  Beta-blocker on hold.   Essential  hypertension Continue torsemide , Farxiga . Losartan , Bisoprolol  on hold.   CKD stage IIIb: Baseline creatinine around  1.5.  On admission serum creatinine 1.7 Now trending down below baseline. Avoid nephrotoxic drugs.   Hypothyroidism:  continue home dose Synthroid  resumed.   GERD Continue PPI  Type 2 diabetes mellitus, uncontrolled with hyperglycemia with CKD without long-term insulin  use. Hemoglobin A1c 7.4 in May. CBG remains elevated. On Farxiga  at home.  Currently on sliding scale insulin .  Witnessed fall. Patient had reportedly a fall in the hospital.  She was trying to walk around and stood up and suddenly her legs gave and she was lowered down with assistance.  No injury was reported by the staff. Upon evaluation patient denies any injury.  Patient reports fatigue. No new focal deficit on exam. Reports headache. Will repeat CT scan to ensure no acute abnormality.\DVT prophylaxis as well as warfarin.   Subjective: No nausea no vomiting no fever no chills.  No acute complaint in the morning.  Agreed to initiate warfarin.  Later in the day had another fall.  Warfarin was discontinued.  But already received the medication.  Patient reported headache again.  No other acute complaint.  Reports feeling fatigue and tired.  Physical Exam: Clear to auscultation. S1-S2 present Bowel sound present Bilateral carotid x 3. Pupils are equal and reactive to light. Left scar.  As well as around the orbit swelling unchanged. No new bruising. No new focal deficit.  Data Reviewed: I have Reviewed nursing notes, Vitals, and Lab results. Since last encounter, pertinent lab results CBC and BMP   . I have ordered test including CBC and BMP  .  Ordered CT scan  Disposition: Status is: Inpatient Remains inpatient appropriate because: Monitor for improvement in mentation  SCDs Start: 06/28/24 1633   Family Communication: Husband at bedside in the morning. Level of care: Progressive    Vitals:   07/06/24 0300 07/06/24 0805 07/06/24 1200 07/06/24 1609  BP: (!) 105/50  (!) 130/57 122/71  Pulse: 78  69 81  Resp: 14     Temp: 99.1 F (37.3 C) 98.5 F (36.9 C) 98.5 F (36.9 C) 98.3 F (36.8 C)  TempSrc: Axillary Oral Oral Oral  SpO2: 95%  99% 90%  Weight:      Height:         Author: Yetta Blanch, MD 07/06/2024 6:57 PM  Please look on [email or web address] to find out who is on call.

## 2024-07-06 NOTE — Progress Notes (Signed)
 PHARMACY - ANTICOAGULATION CONSULT NOTE  Pharmacy Consult for warfarin  Indication: AVR/MVR, afib  No Known Allergies  Patient Measurements: Height: 5' 3 (160 cm) Weight: 68 kg (150 lb) IBW/kg (Calculated) : 52.4 HEPARIN  DW (KG): 66.3  Vital Signs: Temp: 98.5 F (36.9 C) (10/04 0805) Temp Source: Oral (10/04 0805) BP: 105/50 (10/04 0300) Pulse Rate: 78 (10/04 0300)  Labs: Recent Labs    07/04/24 0533 07/04/24 1505 07/05/24 0457 07/06/24 0856  HGB 10.1*  --  10.3* 10.9*  HCT 33.4*  --  34.3* 36.7  PLT 136*  --  130* 147*  LABPROT  --  16.2*  --   --   INR  --  1.2  --   --   CREATININE 1.28*  --  1.32* 1.43*    Estimated Creatinine Clearance: 29 mL/min (A) (by C-G formula based on SCr of 1.43 mg/dL (H)).   Medical History: Past Medical History:  Diagnosis Date   Anemia    Asthma 2010   Atrial fibrillation (HCC)    Atrial flutter (HCC)    Breast carcinoma (HCC) 1995   1995   Cardioembolic stroke (HCC) 01/2012   Right frontal in 01/2012; normal carotid ultrasound; possible LAA thrombus by TEE; virtual complete neurologic recovery   Carotid artery stenosis 1961   Chronic kidney disease, stage 2, mildly decreased GFR    GFR of approximately 60   Diabetes mellitus without complication (HCC) 5+ years ago   Controlled most of time   Diverticulosis of colon (without mention of hemorrhage) 2012   Dr. Golda   Fasting hyperglycemia    120 fasting   Gastroesophageal reflux disease    Gastroparesis    Heart failure with improved ejection fraction (HFimpEF) (HCC) 5+ years   Heart murmur 1961   Hemorrhoids    History of aortic valve replacement with bioprosthetic valve    History of mitral valve replacement with bioprosthetic valve    Hyperlipidemia    Hypertension    pt denies 05/30/13     Dr Alvan chester   Hyponatremia    Hypothyroidism    Rheumatic heart disease    Shortness of breath    Syncope and collapse February, 2024     Assessment: 78 yo W  on warfarin PTA for AVR/MVR and afib. Patient admitted 9/26 with syncope and fall causing multiple facial fractures and intraparenchymal hemorrhage. Concern for afib causing syncope.  Pharmacy consulted for warfarin dosing 10/4.   Hgb stable. INR 1.2 on 10/2. Last  warfarin was PTA 9/5. Amiodarone  is home med. Eating 75-100 % of meals.   PTA warfarin 1.25 Thur/Sun, 2.5mg  AOD  Goal of Therapy:  INR 2.5-3.5 Monitor platelets by anticoagulation protocol: Yes   Plan:  Warfarin 4mg  x1 Monitor daily INR, signs/symptoms of bleeding   Jinnie Door, PharmD, BCPS, BCCP Clinical Pharmacist  Please check AMION for all [facility] Pharmacy phone numbers After 10:00 PM, call Main Pharmacy [phone]

## 2024-07-06 NOTE — Progress Notes (Addendum)
 This RN attempted to assist patient to restroom. Patient attempted to get up from the chair. This RN educated the patient to use the handles of the chair to push up. After patient's held onto the front wheel walker, patient stood straight. Then, the patient's knees buckled slightly, and said, I feel weak. This RN held onto the patient the entire time. Patient started to slowly squat until their bottom touched the floor. Patient did not hit their head, arm, or legs. This RN called for assistance. This RN and another RN got the patient back into the chair. Patient reported 5/10 headache, but no dizziness or shortness of breath. Charge nurse and MD notified. Safety zone and post fall huddle form completed.    07/06/24 1609  What Happened  Was fall witnessed? Yes  Who witnessed fall? Makiah Foye RN  Patients activity before fall ambulating-assisted  Point of contact buttocks  Was patient injured? No  Adult Fall Risk Assessment  Risk Factor Category (scoring not indicated) History of more than one fall within 6 months before admission (document High fall risk)  Patient Fall Risk Level High fall risk  Adult Fall Risk Interventions  Required Bundle Interventions *See Row Information* High fall risk  Additional Interventions PT/OT need assessed if change in mobility from baseline  Fall intervention(s) refused/Patient educated regarding refusal Bed alarm;Nonskid socks  Screening for Fall Injury Risk (To be completed on HIGH fall risk patients) - Assessing Need for Floor Mats  Risk For Fall Injury- Criteria for Floor Mats Admitted as a result of a fall  Will Implement Floor Mats Yes  Vitals  Temp 98.3 F (36.8 C)  Temp Source Oral  BP 122/71  MAP (mmHg) 87  BP Location Left Arm  BP Method Automatic  Patient Position (if appropriate) Sitting  Pulse Rate 81  Pulse Rate Source Monitor  ECG Heart Rate 84  Oxygen  Therapy  SpO2 90 %  O2 Device Nasal Cannula  O2 Flow Rate (L/min) 2 L/min  Pain  Assessment  Pain Scale 0-10  Pain Score 5  Pain Type Acute pain  Pain Location Head  Pain Orientation Posterior  Pain Descriptors / Indicators Aching  Pain Frequency Occasional  Pain Onset On-going  Patients Stated Pain Goal 0  Multiple Pain Sites No

## 2024-07-07 DIAGNOSIS — S06321A Contusion and laceration of left cerebrum with loss of consciousness of 30 minutes or less, initial encounter: Secondary | ICD-10-CM | POA: Diagnosis not present

## 2024-07-07 LAB — BASIC METABOLIC PANEL WITH GFR
Anion gap: 11 (ref 5–15)
BUN: 20 mg/dL (ref 8–23)
CO2: 29 mmol/L (ref 22–32)
Calcium: 8.6 mg/dL — ABNORMAL LOW (ref 8.9–10.3)
Chloride: 98 mmol/L (ref 98–111)
Creatinine, Ser: 1.29 mg/dL — ABNORMAL HIGH (ref 0.44–1.00)
GFR, Estimated: 42 mL/min — ABNORMAL LOW (ref >60)
Glucose, Bld: 104 mg/dL — ABNORMAL HIGH (ref 70–99)
Potassium: 3.7 mmol/L (ref 3.5–5.1)
Sodium: 138 mmol/L (ref 135–145)

## 2024-07-07 LAB — GLUCOSE, CAPILLARY
Glucose-Capillary: 121 mg/dL — ABNORMAL HIGH (ref 70–99)
Glucose-Capillary: 165 mg/dL — ABNORMAL HIGH (ref 70–99)
Glucose-Capillary: 196 mg/dL — ABNORMAL HIGH (ref 70–99)
Glucose-Capillary: 128 mg/dL — ABNORMAL HIGH (ref 70–99)

## 2024-07-07 LAB — CBC
HCT: 32.5 % — ABNORMAL LOW (ref 36.0–46.0)
Hemoglobin: 9.8 g/dL — ABNORMAL LOW (ref 12.0–15.0)
MCH: 26.7 pg (ref 26.0–34.0)
MCHC: 30.2 g/dL (ref 30.0–36.0)
MCV: 88.6 fL (ref 80.0–100.0)
Platelets: 129 K/uL — ABNORMAL LOW (ref 150–400)
RBC: 3.67 MIL/uL — ABNORMAL LOW (ref 3.87–5.11)
RDW: 17.3 % — ABNORMAL HIGH (ref 11.5–15.5)
WBC: 5.7 K/uL (ref 4.0–10.5)
nRBC: 0 % (ref 0.0–0.2)

## 2024-07-07 LAB — MAGNESIUM: Magnesium: 2.1 mg/dL (ref 1.7–2.4)

## 2024-07-07 LAB — PROTIME-INR
INR: 1.1 (ref 0.8–1.2)
Prothrombin Time: 14.8 s (ref 11.4–15.2)

## 2024-07-07 MED ORDER — WARFARIN - PHARMACIST DOSING INPATIENT: Freq: Every day | Status: DC

## 2024-07-07 MED ORDER — WARFARIN SODIUM 6 MG PO TABS
6.0000 mg | ORAL_TABLET | Freq: Once | ORAL | Status: AC
  Administered 2024-07-07: 6 mg via ORAL
  Filled 2024-07-07: qty 1

## 2024-07-07 NOTE — Progress Notes (Signed)
 Occupational Therapy Treatment Patient Details Name: [name] MRN: [id number] DOB: 1945-11-10 Today's Date: 07/07/2024   History of present illness Pt is an 78 y.o. female presenting 9/26 after ground level fall. MRI brain with 1.2 cm acute intraparenchymal hemorrhage at the subcortical L frontal lobe, likely a small hemorrhagic contusion; intraventricular hemorrhage layering within the occipital horns of both lateral ventricles. L orbital fx PMH: HFpEF, A-fib/flutter on warfarin, rheumatic heart disease, AV and MV replacement, COPD, CVA, carotid artery stenosis, CKD-3B, NIDDM-2, hypothyroidism, lumbar laminectomy and decompression and mild cognitive impairment   OT comments  Pt progressing toward established OT goals. Facilitating activity tolerance, balance, sequencing, problem solving, and pacing with OT providing cues for safety during transfers and for sequencing intermittently while pt sponge bathing from chair and going to restroom during session. Pt continues to be limited by activity tolerance, O2 dependence, cognition, and balance. Current discharge plan remains appropriate. Highly recommending intensive multidisciplinary inpatient rehabilitation.        If plan is discharge home, recommend the following:  A little help with walking and/or transfers;A little help with bathing/dressing/bathroom;Assistance with cooking/housework;Assist for transportation;Help with stairs or ramp for entrance;Direct supervision/assist for medications management;Direct supervision/assist for financial management   Equipment Recommendations  Other (comment) (defer)    Recommendations for Other Services      Precautions / Restrictions Precautions Precautions: Fall Recall of Precautions/Restrictions: Impaired Precaution/Restrictions Comments: ?syncope prior to fall; husband uncertain. Restrictions Weight Bearing Restrictions Per Provider Order: No       Mobility Bed Mobility                General bed mobility comments: OOB in chair    Transfers Overall transfer level: Needs assistance Equipment used: Rolling walker (2 wheels) Transfers: Sit to/from Stand Sit to Stand: Contact guard assist           General transfer comment: verbal cues for hand placement on all attempts     Balance Overall balance assessment: Needs assistance Sitting-balance support: No upper extremity supported, Feet supported Sitting balance-Leahy Scale: Good     Standing balance support: Single extremity supported, Reliant on assistive device for balance Standing balance-Leahy Scale: Poor                             ADL either performed or assessed with clinical judgement   ADL Overall ADL's : Needs assistance/impaired         Upper Body Bathing: Set up;Sitting   Lower Body Bathing: Minimal assistance;Sitting/lateral leans;Sit to/from stand Lower Body Bathing Details (indicate cue type and reason): cues for safety Upper Body Dressing : Minimal assistance;Sitting Upper Body Dressing Details (indicate cue type and reason): to bring robe around back Lower Body Dressing: Minimal assistance;Sit to/from stand   Toilet Transfer: Minimal assistance;Ambulation;Rolling walker (2 wheels)   Toileting- Clothing Manipulation and Hygiene: Minimal assistance;Sit to/from stand Toileting - Clothing Manipulation Details (indicate cue type and reason): for balance     Functional mobility during ADLs: Minimal assistance;Rolling walker (2 wheels)      Extremity/Trunk Assessment Upper Extremity Assessment Upper Extremity Assessment: Generalized weakness   Lower Extremity Assessment Lower Extremity Assessment: Defer to PT evaluation        Vision       Perception     Praxis     Communication Communication Communication: No apparent difficulties   Cognition Arousal: Alert Behavior During Therapy: Flat affect Cognition: Cognition impaired     Awareness: Intellectual  awareness intact, Online awareness impaired Memory impairment (select all impairments): Short-term memory Attention impairment (select first level of impairment): Sustained attention, Selective attention Executive functioning impairment (select all impairments): Organization, Problem solving, Sequencing OT - Cognition Comments: continues to benefit from cues for hand placement during transfers and occasionally needs cues for sustaining task or going to next step during bathing this session                 Following commands: Intact        Cueing   Cueing Techniques: Verbal cues, Tactile cues, Visual cues  Exercises      Shoulder Instructions       General Comments      Pertinent Vitals/ Pain       Pain Assessment Pain Assessment: No/denies pain  Home Living                                          Prior Functioning/Environment              Frequency           Progress Toward Goals  OT Goals(current goals can now be found in the care plan section)  Progress towards OT goals: Progressing toward goals  Acute Rehab OT Goals OT Goal Formulation: With patient Time For Goal Achievement: 07/13/24 Potential to Achieve Goals: Good ADL Goals Pt Will Perform Grooming: standing;with supervision Pt Will Perform Lower Body Dressing: with supervision;with adaptive equipment;sit to/from stand Pt Will Transfer to Toilet: ambulating;with contact guard assist Pt Will Perform Toileting - Clothing Manipulation and hygiene: with supervision;sit to/from stand  Plan      Co-evaluation                 AM-PAC OT 6 Clicks Daily Activity     Outcome Measure   Help from another person eating meals?: A Little Help from another person taking care of personal grooming?: A Little Help from another person toileting, which includes using toliet, bedpan, or urinal?: A Little Help from another person bathing (including washing, rinsing, drying)?: A  Lot Help from another person to put on and taking off regular upper body clothing?: A Little Help from another person to put on and taking off regular lower body clothing?: A Little 6 Click Score: 17    End of Session Equipment Utilized During Treatment: Gait belt;Rolling walker (2 wheels)  OT Visit Diagnosis: Unsteadiness on feet (R26.81);Muscle weakness (generalized) (M62.81);Other symptoms and signs involving cognitive function;Pain;Low vision, both eyes (H54.2)   Activity Tolerance Patient tolerated treatment well   Patient Left in chair;with call bell/phone within reach;with chair alarm set;with family/visitor present   Nurse Communication Mobility status        Time: 8495-8469 OT Time Calculation (min): 26 min  Charges: OT General Charges $OT Visit: 1 Visit OT Treatments $Self Care/Home Management : 23-37 mins  [name], OTR/L [facility] Acute Rehabilitation Office: [phone]   [name] 07/07/2024, 4:37 PM

## 2024-07-07 NOTE — Progress Notes (Addendum)
 PHARMACY - ANTICOAGULATION CONSULT NOTE  Pharmacy Consult for warfarin  Indication: AVR/MVR, afib  No Known Allergies  Patient Measurements: Height: 5' 3 (160 cm) Weight: 68 kg (150 lb) IBW/kg (Calculated) : 52.4 HEPARIN  DW (KG): 66.3  Vital Signs: Temp: 98.6 F (37 C) (10/05 1130) Temp Source: Oral (10/05 1130) BP: 105/86 (10/05 1130) Pulse Rate: 73 (10/05 1130)  Labs: Recent Labs    07/04/24 1505 07/05/24 0457 07/05/24 0457 07/06/24 0856 07/07/24 0411  HGB  --  10.3*   < > 10.9* 9.8*  HCT  --  34.3*  --  36.7 32.5*  PLT  --  130*  --  147* 129*  LABPROT 16.2*  --   --   --  14.8  INR 1.2  --   --   --  1.1  CREATININE  --  1.32*  --  1.43* 1.29*   < > = values in this interval not displayed.    Estimated Creatinine Clearance: 32.2 mL/min (A) (by C-G formula based on SCr of 1.29 mg/dL (H)).   Medical History: Past Medical History:  Diagnosis Date   Anemia    Asthma 2010   Atrial fibrillation (HCC)    Atrial flutter (HCC)    Breast carcinoma (HCC) 1995   1995   Cardioembolic stroke (HCC) 01/2012   Right frontal in 01/2012; normal carotid ultrasound; possible LAA thrombus by TEE; virtual complete neurologic recovery   Carotid artery stenosis 1961   Chronic kidney disease, stage 2, mildly decreased GFR    GFR of approximately 60   Diabetes mellitus without complication (HCC) 5+ years ago   Controlled most of time   Diverticulosis of colon (without mention of hemorrhage) 2012   Dr. Golda   Fasting hyperglycemia    120 fasting   Gastroesophageal reflux disease    Gastroparesis    Heart failure with improved ejection fraction (HFimpEF) (HCC) 5+ years   Heart murmur 1961   Hemorrhoids    History of aortic valve replacement with bioprosthetic valve    History of mitral valve replacement with bioprosthetic valve    Hyperlipidemia    Hypertension    pt denies 05/30/13     Dr Alvan chester   Hyponatremia    Hypothyroidism    Rheumatic heart disease     Shortness of breath    Syncope and collapse February, 2024     Assessment: 78 yo W on warfarin PTA for AVR/MVR and afib. Patient admitted 9/26 with syncope and fall causing multiple facial fractures and intraparenchymal hemorrhage. Concern for afib causing syncope.  Pharmacy consulted for warfarin dosing 10/4.   Hgb stable. Amiodarone  is home med. Eating 90-100 % of meals. INR 1.1 after 1 dose. Patient had another fall 10/4 PM, warfarin consult was discontinued but then re-entered as patient had already received warfarin.   PTA warfarin 1.25 Thur/Sun, 2.5mg  AOD  Goal of Therapy:  INR 2.5-3.5 Monitor platelets by anticoagulation protocol: Yes   Plan:  Warfarin 6 mg x1 Monitor daily INR, signs/symptoms of bleeding   Jinnie Door, PharmD, BCPS, BCCP Clinical Pharmacist  Please check AMION for all [facility] Pharmacy phone numbers After 10:00 PM, call Main Pharmacy [phone]

## 2024-07-07 NOTE — Progress Notes (Signed)
 [facility] Progress Note Patient: [name] FMW:[id number] DOB: 10-May-1946  DOA: 06/28/2024 DOS: the patient was seen and examined on 07/07/2024  Brief Hospital Course: [name] is a 78 y.o. female with PMH of HFpEF, A-fib/flutter on warfarin, rheumatic heart disease, AV and MV replacement, COPD, CVA, carotid artery stenosis, CKD-3B, NIDDM-2, hypothyroidism, lumbar laminectomy and decompression and mild cognitive impairment brought to ED by EMS after syncopal episode.    Patient sustained facial injury, CT head cervical and maxillofacial showed 7 mm focus of acute IPH over the left frontal white matter, minimal acute SAH over the distal right frontal lobe, depressed comminuted left orbital floor fracture with bilateral entrapment of inferior rectus muscle, fracture of medial wall of the left orbit without entrapment of intraorbital contents, minimally displaced nasal bone fracture as well as fracture of junction of left nasal bone to the frontal process of left maxilla. Neurosurgery consulted recommended to hold anticoagulation, syncope workup.  ENT and ophthalmology evaluated the patient.  Admitted to TRH service for further management and syncope evaluation. ENT repaired lacerations. Cardiology placed ILR.  Assessment and Plan: Syncope and collapse Presented with a witnessed syncopal event with loss of consciousness and head trauma. Found to have IPH and contusion likely due to trauma, but no evidence of stroke or other etiologies to explain syncope on MRI brain. No evidence of reoccurrence of seizures here. Preserved EF on echocardiogram, no significant valvular abnormality. Cardiology/EP was consulted, ILR was recommended and placed. Concern for recurrence of A-fib as a cause of her syncope.   Orbital floor fracture, nasal bone fracture Left inferior rectus muscle entrapment. CT maxillofacial Depressed comminuted left orbital floor fracture with partial entrapment of  inferior rectus muscle. Fracture of the medial wall the left orbit without entrapment of intraorbital contents. Minimally displaced nasal bone fracture as well as fracture of the junction of the left nasal bone to the frontal process of the left maxilla. seen by ENT Dr. Tobie, repaired laceration injuries. Also seen by ophthalmology. Treated with Unasyn . Patient reported back pain as well as shoulder pain.  X-ray of the thoracic and lumbar spine as well as right shoulder were negative for any acute abnormality.  ENT recommending Peridex  twice daily, Augmentin twice daily 7-day, bacitracin  twice daily 7-day.  EOT 10/2. No nose blowing for 2 weeks, as needed Afrin for epistaxis Soft diet for 5-day and then advance to regular. ENT follow-up in 2 to 3 weeks. Ophthalmology Dr. Corbin recommended outpatient follow-up.  Suspect entrapment is unlikely by clinical exam.   Acute intra parenchymal hemorrhage on warfarin. Surrounding cerebral edema Seen by neurosurgery Dr. Onetha. Repeat MRI reviewed by neurosurgery, stable. No surgical intervention.   Cervical spine MRI unremarkable.  Cervical collar removed. Discussed with Dr. Onetha, initiate warfarin 72 hours after initial injury. Repeat CT scan performed on 10/2 due to patient complaining of worsening headache which shows no new areas of intracranial hemorrhage and expected evolution of the prior hemorrhages seen. Due to prophylaxis now on hold again.   Hypokalemia Corrected.   Chronic HFpEF Chronic RV systolic dysfunction. Rheumatic valvular disease including MS/AI/AS s/p pericardial MVR, pericardial AVR 2014  Appears euvolemic. Current and prior echo shows right ventricular systolic function is moderately reduced. Continue torsemide , hold Farxiga .  Losartan , Bisoprolol  on hold.  Paroxysmal A-fib/flutter First-degree AV block Prolonged QT-last QTc 459 on 9/28. Continue amiodarone . Resume Coumadin  Beta-blocker on hold.   Essential  hypertension Continue torsemide , hold Farxiga . Losartan , Bisoprolol  on hold.   CKD stage IIIb: Baseline  creatinine around 1.5.  On admission serum creatinine 1.7 Now trending down below baseline. Avoid nephrotoxic drugs.   Hypothyroidism:  continue home dose Synthroid  resumed.   GERD Continue PPI  Type 2 diabetes mellitus, uncontrolled with hyperglycemia with CKD without long-term insulin  use. Hemoglobin A1c 7.4 in May. CBG remains elevated. On Farxiga  at home.  Currently on sliding scale insulin .  Witnessed fall. Patient had reportedly a fall in the hospital.  She was trying to walk around and stood up and suddenly her legs gave and she was lowered down with assistance.  No injury was reported by the staff. Upon evaluation patient denies any injury.  Patient reports fatigue. No new focal deficit on exam. Reports headache after the fall.  Repeat CT scan actually shows resolution of IPH. No neuro events overnight.  Headache resolved. Will resume warfarin.   Subjective: No nausea no vomiting no fever no chills.  No chest pain.  Headache resolved.  No focal deficit.  Physical Exam: Clear to auscultation. S1-S2 present  bowel sound present Trace edema.  Data Reviewed: I have Reviewed nursing notes, Vitals, and Lab results. Since last encounter, pertinent lab results CBC and BMP and INR   . I have ordered test including CBC and BMP and INR  .   Disposition: Status is: Inpatient Remains inpatient appropriate because: Monitor for tolerance of anticoagulation  SCDs Start: 06/28/24 1633  Family Communication: Family at bedside Level of care: Progressive   Vitals:   07/07/24 0656 07/07/24 0800 07/07/24 1130 07/07/24 1617  BP: (!) 149/67 134/66 105/86 129/79  Pulse: 73 73 73 78  Resp: 18 18 16 18   Temp: 98.8 F (37.1 C) 98.6 F (37 C) 98.6 F (37 C) (!) 97.4 F (36.3 C)  TempSrc: Oral Oral Oral Oral  SpO2: 97% 94% 100% 94%  Weight:      Height:          Author: Yetta Blanch, MD 07/07/2024 7:49 PM  Please look on [email or web address] to find out who is on call.

## 2024-07-08 DIAGNOSIS — S06321A Contusion and laceration of left cerebrum with loss of consciousness of 30 minutes or less, initial encounter: Secondary | ICD-10-CM | POA: Diagnosis not present

## 2024-07-08 LAB — CBC
HCT: 34.4 % — ABNORMAL LOW (ref 36.0–46.0)
Hemoglobin: 10.4 g/dL — ABNORMAL LOW (ref 12.0–15.0)
MCH: 26.7 pg (ref 26.0–34.0)
MCHC: 30.2 g/dL (ref 30.0–36.0)
MCV: 88.4 fL (ref 80.0–100.0)
Platelets: 145 K/uL — ABNORMAL LOW (ref 150–400)
RBC: 3.89 MIL/uL (ref 3.87–5.11)
RDW: 17.6 % — ABNORMAL HIGH (ref 11.5–15.5)
WBC: 5.7 K/uL (ref 4.0–10.5)
nRBC: 0 % (ref 0.0–0.2)

## 2024-07-08 LAB — PROTIME-INR
INR: >10 (ref 0.8–1.2)
INR: 1.2 (ref 0.8–1.2)
INR: 1.2 (ref 0.8–1.2)
Prothrombin Time: >90 s — ABNORMAL HIGH (ref 11.4–15.2)
Prothrombin Time: 15.6 s — ABNORMAL HIGH (ref 11.4–15.2)
Prothrombin Time: 15.6 s — ABNORMAL HIGH (ref 11.4–15.2)

## 2024-07-08 LAB — BASIC METABOLIC PANEL WITH GFR
Anion gap: 12 (ref 5–15)
BUN: 15 mg/dL (ref 8–23)
CO2: 29 mmol/L (ref 22–32)
Calcium: 8.6 mg/dL — ABNORMAL LOW (ref 8.9–10.3)
Chloride: 98 mmol/L (ref 98–111)
Creatinine, Ser: 1.33 mg/dL — ABNORMAL HIGH (ref 0.44–1.00)
GFR, Estimated: 40 mL/min — ABNORMAL LOW (ref >60)
Glucose, Bld: 102 mg/dL — ABNORMAL HIGH (ref 70–99)
Potassium: 3.4 mmol/L — ABNORMAL LOW (ref 3.5–5.1)
Sodium: 139 mmol/L (ref 135–145)

## 2024-07-08 LAB — GLUCOSE, CAPILLARY
Glucose-Capillary: 148 mg/dL — ABNORMAL HIGH (ref 70–99)
Glucose-Capillary: 163 mg/dL — ABNORMAL HIGH (ref 70–99)
Glucose-Capillary: 183 mg/dL — ABNORMAL HIGH (ref 70–99)
Glucose-Capillary: 136 mg/dL — ABNORMAL HIGH (ref 70–99)

## 2024-07-08 LAB — MAGNESIUM: Magnesium: 2 mg/dL (ref 1.7–2.4)

## 2024-07-08 MED ORDER — GUAIFENESIN-DM 100-10 MG/5ML PO SYRP
5.0000 mL | ORAL_SOLUTION | ORAL | Status: DC | PRN
  Administered 2024-07-08 – 2024-07-10 (×4): 5 mL via ORAL
  Filled 2024-07-08 (×4): qty 5

## 2024-07-08 MED ORDER — SODIUM CHLORIDE 0.9% IV SOLUTION: Freq: Once | INTRAVENOUS | Status: AC

## 2024-07-08 MED ORDER — POTASSIUM CHLORIDE CRYS ER 20 MEQ PO TBCR
40.0000 meq | EXTENDED_RELEASE_TABLET | ORAL | Status: AC
  Administered 2024-07-08 (×2): 40 meq via ORAL
  Filled 2024-07-08 (×2): qty 2

## 2024-07-08 MED ORDER — OXYMETAZOLINE HCL 0.05 % NA SOLN
1.0000 | NASAL | Status: DC | PRN
  Filled 2024-07-08: qty 30

## 2024-07-08 MED ORDER — VITAMIN K1 10 MG/ML IJ SOLN
5.0000 mg | Freq: Once | INTRAVENOUS | Status: DC
  Administered 2024-07-08: 5 mg via INTRAVENOUS
  Filled 2024-07-08: qty 0.5

## 2024-07-08 NOTE — Progress Notes (Addendum)
 Lab called this RN to report a critical lab result of INR>10. Tobie Yetta HERO, MD notified. New orders for vit K and fresh frozen plasma, and lab redraw stat ordered. MD came to bedside to assess pt. Pt c/o nose bleed this a.m. MD aware.

## 2024-07-08 NOTE — Progress Notes (Signed)
 PT Cancellation Note  Patient Details Name: ANETT RANKER MRN: [id number] DOB: 11/21/1945   Cancelled Treatment:    Reason Eval/Treat Not Completed: Medical issues which prohibited therapy - INR >10, RN and MD requesting PT hold. PT to check back tomorrow.   Wyllow Seigler S, PT DPT Acute Rehabilitation Services Secure Chat Preferred  Office [phone]    Johana FORBES Kingdom 07/08/2024, 9:58 AM

## 2024-07-08 NOTE — Progress Notes (Signed)
 PHARMACY - ANTICOAGULATION CONSULT NOTE  Pharmacy Consult for warfarin  Indication: AVR/MVR, afib  No Known Allergies  Patient Measurements: Height: 5' 3 (160 cm) Weight: 68 kg (150 lb) IBW/kg (Calculated) : 52.4 HEPARIN  DW (KG): 66.3  Vital Signs: Temp: 98.5 F (36.9 C) (10/06 0802) Temp Source: Oral (10/06 0802) BP: 156/69 (10/06 0802) Pulse Rate: 71 (10/06 0802)  Labs: Recent Labs    07/06/24 0856 07/07/24 0411 07/08/24 0648  HGB 10.9* 9.8* 10.4*  HCT 36.7 32.5* 34.4*  PLT 147* 129* 145*  LABPROT  --  14.8 >90.0*  INR  --  1.1 >10.0*  CREATININE 1.43* 1.29* 1.33*    Estimated Creatinine Clearance: 31.2 mL/min (A) (by C-G formula based on SCr of 1.33 mg/dL (H)).  Assessment: 78 yo W on warfarin PTA for prosthetic AVR/MVR and Afib. Patient admitted 9/26 with syncope and fall causing multiple facial fractures and IPH. Concern for Afib causing syncope.  Pharmacy consulted for warfarin dosing 10/4.   PTA warfarin 1.25 Thur/Sun, 2.5mg  AOD  INR supra-therapeutic at > 10 today s/p Coumadin  x 2 doses.  CBC stable.  Having nosebleed per discussion with MD.  Goal of Therapy:  INR 2.5-3.5 Monitor platelets by anticoagulation protocol: Yes   Plan:  Hold Coumadin  while awaiting confirmatory INR Vit K 5mg  IV x 1 - MD may repeat INR and redose if indicated  Stormee Duda D. [place], PharmD, BCPS, BCCCP 07/08/2024, 9:52 AM

## 2024-07-08 NOTE — Progress Notes (Signed)
 MD notified this RN of lab redraw INR level of 1.2. Fresh frozen plasma order was d/c and vit K stopped. New orders for INR level recheck in 6hrs, neuro and muscular checks every 4 hours.

## 2024-07-08 NOTE — Plan of Care (Signed)
   Problem: Coping: Goal: Ability to adjust to condition or change in health will improve Outcome: Progressing   Problem: Fluid Volume: Goal: Ability to maintain a balanced intake and output will improve Outcome: Progressing   Problem: Health Behavior/Discharge Planning: Goal: Ability to identify and utilize available resources and services will improve Outcome: Progressing

## 2024-07-08 NOTE — Progress Notes (Signed)
 [facility] Progress Note Patient: [name] FMW:[id number] DOB: 02/01/1946  DOA: 06/28/2024 DOS: the patient was seen and examined on 07/08/2024  Brief Hospital Course: [name] is a 78 y.o. female with PMH of HFpEF, A-fib/flutter on warfarin, rheumatic heart disease, AV and MV replacement, COPD, CVA, carotid artery stenosis, CKD-3B, NIDDM-2, hypothyroidism, lumbar laminectomy and decompression and mild cognitive impairment brought to ED by EMS after syncopal episode.    Patient sustained facial injury, CT head cervical and maxillofacial showed 7 mm focus of acute IPH over the left frontal white matter, minimal acute SAH over the distal right frontal lobe, depressed comminuted left orbital floor fracture with bilateral entrapment of inferior rectus muscle, fracture of medial wall of the left orbit without entrapment of intraorbital contents, minimally displaced nasal bone fracture as well as fracture of junction of left nasal bone to the frontal process of left maxilla. Neurosurgery consulted recommended to hold anticoagulation, syncope workup.  ENT and ophthalmology evaluated the patient.  Admitted to TRH service for further management and syncope evaluation. ENT repaired lacerations. Cardiology placed ILR.  Assessment and Plan: Syncope and collapse Presented with a witnessed syncopal event with loss of consciousness and head trauma. Found to have IPH and contusion likely due to trauma, but no evidence of stroke or other etiologies to explain syncope on MRI brain. No evidence of reoccurrence of seizures here. Preserved EF on echocardiogram, no significant valvular abnormality. Cardiology/EP was consulted, ILR was recommended and placed. Concern for recurrence of A-fib as a cause of her syncope.   Orbital floor fracture, nasal bone fracture Left inferior rectus muscle entrapment. CT maxillofacial Depressed comminuted left orbital floor fracture with partial entrapment of  inferior rectus muscle. Fracture of the medial wall the left orbit without entrapment of intraorbital contents. Minimally displaced nasal bone fracture as well as fracture of the junction of the left nasal bone to the frontal process of the left maxilla. seen by ENT Dr. Tobie, repaired laceration injuries. Also seen by ophthalmology. Treated with Unasyn . Patient reported back pain as well as shoulder pain.  X-ray of the thoracic and lumbar spine as well as right shoulder were negative for any acute abnormality.  ENT recommending Peridex  twice daily, Augmentin twice daily 7-day, bacitracin  twice daily 7-day.  EOT 10/2. No nose blowing for 2 weeks, as needed Afrin for epistaxis Soft diet for 5-day and then advance to regular. ENT follow-up in 2 to 3 weeks. Ophthalmology Dr. Corbin recommended outpatient follow-up.  Suspect entrapment is unlikely by clinical exam.   Acute intra parenchymal hemorrhage on warfarin. Surrounding cerebral edema Seen by neurosurgery Dr. Onetha. Repeat MRI reviewed by neurosurgery, stable. No surgical intervention.   Cervical spine MRI unremarkable.  Cervical collar removed. Discussed with Dr. Onetha, initiate warfarin 72 hours after initial injury. Repeat CT scan performed on 10/2 due to patient complaining of worsening headache which shows no new areas of intracranial hemorrhage and expected evolution of the prior hemorrhages seen. Due to prophylaxis now on hold again.   Hypokalemia Corrected.  Abnormal lab. On 10/6 patient's INR came back as more than detectable range more than 10. Patient also reported some epistaxis. Patient was given vitamin K and FFP were ordered. Patient received one fourth of the vitamin K when a recheck INR came back as 1.2 only. Epistaxis have resolved for now. For now we will be holding off on warfarin. Monitor daily INR.  Epistaxis. Afrin as needed. Monitor for resolution before initiating Coumadin .   Chronic HFpEF Chronic  RV  systolic dysfunction. Rheumatic valvular disease including MS/AI/AS s/p pericardial MVR, pericardial AVR 2014  Appears euvolemic. Current and prior echo shows right ventricular systolic function is moderately reduced. Continue torsemide , hold Farxiga .  Losartan , Bisoprolol  on hold.  Paroxysmal A-fib/flutter First-degree AV block Prolonged QT-last QTc 459 on 9/28. Continue amiodarone . Resume Coumadin  when epistaxis resolved.  Confirmed with EP no other options for anticoagulation.   Essential hypertension Continue torsemide , hold Farxiga . Losartan , Bisoprolol  on hold.   CKD stage IIIb: Baseline creatinine around 1.5.  On admission serum creatinine 1.7 Now trending down below baseline. Avoid nephrotoxic drugs.   Hypothyroidism:  continue home dose Synthroid  resumed.   GERD Continue PPI  Type 2 diabetes mellitus, uncontrolled with hyperglycemia with CKD without long-term insulin  use. Hemoglobin A1c 7.4 in May. CBG remains elevated. On Farxiga  at home.  Currently on sliding scale insulin .  Witnessed fall. Patient had reportedly a fall in the hospital.  She was trying to walk around and stood up and suddenly her legs gave and she was lowered down with assistance.  No injury was reported by the staff. Upon evaluation patient denies any injury.  Patient reports fatigue. No new focal deficit on exam. Reports headache after the fall.  Repeat CT scan actually shows resolution of IPH. No neuro events overnight.  Headache resolved. Will resume warfarin.   Subjective: No nausea no vomiting no fever no chills.  No chest pain.   Physical Exam: Clear to auscultation. Epistaxis resolved S1-S2 present No focal deficit.  Data Reviewed: I have Reviewed nursing notes, Vitals, and Lab results. Since last encounter, pertinent lab results CBC and BMP   . I have ordered test including CBC and BMP and INR  . I have discussed pt's care plan and test results with cardiology  .    Disposition: Status is: Inpatient Remains inpatient appropriate because: Monitor for INR stability  SCDs Start: 06/28/24 1633   Family Communication: Husband at bedside Level of care: Progressive   Vitals:   07/08/24 0325 07/08/24 0802 07/08/24 1636 07/08/24 2005  BP: 124/66 (!) 156/69 93/67 (!) 136/56  Pulse: 72 71 79 77  Resp: 19 18 18 18   Temp: 98.5 F (36.9 C) 98.5 F (36.9 C) 98.5 F (36.9 C) 98.6 F (37 C)  TempSrc: Oral Oral Oral Oral  SpO2: 97% 94% 95% 95%  Weight:      Height:         Author: Yetta Blanch, MD 07/08/2024 8:06 PM  Please look on [email or web address] to find out who is on call.

## 2024-07-08 NOTE — Progress Notes (Signed)
 Inpatient Rehab Admissions Coordinator:   Awaiting determination from [facility] regarding expedited appeal.    Reche Lowers, PT, DPT Admissions Coordinator [phone] 07/08/24  11:13 AM

## 2024-07-08 NOTE — Progress Notes (Signed)
 Occupational Therapy Treatment Patient Details Name: [name] MRN: [id number] DOB: Nov 11, 1945 Today's Date: 07/08/2024   History of present illness Pt is an 78 y.o. female presenting 9/26 after ground level fall. MRI brain with 1.2 cm acute intraparenchymal hemorrhage at the subcortical L frontal lobe, likely a small hemorrhagic contusion; intraventricular hemorrhage layering within the occipital horns of both lateral ventricles. L orbital fx PMH: HFpEF, A-fib/flutter on warfarin, rheumatic heart disease, AV and MV replacement, COPD, CVA, carotid artery stenosis, CKD-3B, NIDDM-2, hypothyroidism, lumbar laminectomy and decompression and mild cognitive impairment   OT comments  Patient with fair progress toward patient focused goals.  Up to Min A of one with Min VC's for hand placement and RW management with simple transfers to/from [facility].  Patient continues to need extensive safety cues, and remains short of breath with tasks.  OT will continue efforts in the acute setting and Patient will benefit from intensive inpatient follow-up therapy, >3 hours/day.      If plan is discharge home, recommend the following:  A little help with walking and/or transfers;A little help with bathing/dressing/bathroom;Assistance with cooking/housework;Assist for transportation;Help with stairs or ramp for entrance;Direct supervision/assist for medications management;Direct supervision/assist for financial management   Equipment Recommendations       Recommendations for Other Services      Precautions / Restrictions Precautions Precautions: Fall Recall of Precautions/Restrictions: Impaired Precaution/Restrictions Comments: ?syncope prior to fall; husband uncertain. Restrictions Weight Bearing Restrictions Per Provider Order: No       Mobility Bed Mobility               General bed mobility comments: OOB in chair    Transfers Overall transfer level: Needs assistance Equipment used: Rolling  walker (2 wheels) Transfers: Bed to chair/wheelchair/BSC, Sit to/from Stand Sit to Stand: Contact guard assist     Step pivot transfers: Min assist           Balance Overall balance assessment: Needs assistance Sitting-balance support: No upper extremity supported, Feet supported Sitting balance-Leahy Scale: Good     Standing balance support: Reliant on assistive device for balance Standing balance-Leahy Scale: Poor                             ADL either performed or assessed with clinical judgement   ADL   Eating/Feeding: Sitting;Set up                       Toilet Transfer: Minimal assistance;Ambulation;Rolling walker (2 wheels)                  Extremity/Trunk Assessment Upper Extremity Assessment Upper Extremity Assessment: Generalized weakness   Lower Extremity Assessment Lower Extremity Assessment: Defer to PT evaluation        Vision   Vision Assessment?: No apparent visual deficits Visual Fields: No apparent deficits   Perception Perception Perception: Not tested   Praxis Praxis Praxis: Not tested   Communication Communication Communication: No apparent difficulties   Cognition Arousal: Alert Behavior During Therapy: Flat affect Cognition: Cognition impaired       Memory impairment (select all impairments): Short-term memory Attention impairment (select first level of impairment): Sustained attention Executive functioning impairment (select all impairments): Organization, Problem solving, Sequencing                   Following commands: Impaired Following commands impaired: Follows one step commands with increased time  Cueing   Cueing Techniques: Verbal cues, Tactile cues  Exercises      Shoulder Instructions       General Comments      Pertinent Vitals/ Pain       Pain Assessment Pain Assessment: No/denies pain Pain Intervention(s): Monitored during session                                                           Frequency  Min 2X/week        Progress Toward Goals  OT Goals(current goals can now be found in the care plan section)  Progress towards OT goals: Progressing toward goals  Acute Rehab OT Goals OT Goal Formulation: With patient Time For Goal Achievement: 07/13/24 Potential to Achieve Goals: Good  Plan      Co-evaluation                 AM-PAC OT 6 Clicks Daily Activity     Outcome Measure   Help from another person eating meals?: A Little Help from another person taking care of personal grooming?: A Little Help from another person toileting, which includes using toliet, bedpan, or urinal?: A Little Help from another person bathing (including washing, rinsing, drying)?: A Lot Help from another person to put on and taking off regular upper body clothing?: A Lot Help from another person to put on and taking off regular lower body clothing?: A Lot 6 Click Score: 15    End of Session Equipment Utilized During Treatment: Rolling walker (2 wheels);Oxygen   OT Visit Diagnosis: Unsteadiness on feet (R26.81);Muscle weakness (generalized) (M62.81);Other symptoms and signs involving cognitive function;Pain;Low vision, both eyes (H54.2)   Activity Tolerance Patient limited by fatigue   Patient Left in chair;with chair alarm set;with family/visitor present   Nurse Communication Mobility status        Time: [phone] OT Time Calculation (min): 18 min  Charges: OT General Charges $OT Visit: 1 Visit  07/08/2024  RP, OTR/L  Acute Rehabilitation Services  Office:  [phone]   [name] 07/08/2024, 8:24 AM

## 2024-07-09 DIAGNOSIS — S06321A Contusion and laceration of left cerebrum with loss of consciousness of 30 minutes or less, initial encounter: Secondary | ICD-10-CM | POA: Diagnosis not present

## 2024-07-09 LAB — GLUCOSE, CAPILLARY
Glucose-Capillary: 182 mg/dL — ABNORMAL HIGH (ref 70–99)
Glucose-Capillary: 114 mg/dL — ABNORMAL HIGH (ref 70–99)
Glucose-Capillary: 187 mg/dL — ABNORMAL HIGH (ref 70–99)
Glucose-Capillary: 112 mg/dL — ABNORMAL HIGH (ref 70–99)

## 2024-07-09 LAB — PROTIME-INR
INR: 1.1 (ref 0.8–1.2)
INR: 1.2 (ref 0.8–1.2)
Prothrombin Time: 14.6 s (ref 11.4–15.2)
Prothrombin Time: 15.9 s — ABNORMAL HIGH (ref 11.4–15.2)

## 2024-07-09 LAB — ABO/RH: ABO/RH(D): A POS

## 2024-07-09 MED ORDER — WARFARIN - PHARMACIST DOSING INPATIENT: Freq: Every day | Status: DC

## 2024-07-09 MED ORDER — WARFARIN SODIUM 5 MG PO TABS
5.0000 mg | ORAL_TABLET | Freq: Once | ORAL | Status: AC
Start: 2024-07-09 — End: 2024-07-09
  Administered 2024-07-09: 5 mg via ORAL
  Filled 2024-07-09: qty 1

## 2024-07-09 NOTE — Plan of Care (Signed)

## 2024-07-09 NOTE — Progress Notes (Signed)
 [facility] Progress Note Patient: [name] FMW:[id number] DOB: [birth year]  DOA: 06/28/2024 DOS: the patient was seen and examined on 07/09/2024  Brief Hospital Course: [name] is a 78 y.o. female with PMH of HFpEF, A-fib/flutter on warfarin, rheumatic heart disease, AV and MV replacement, COPD, CVA, carotid artery stenosis, CKD-3B, NIDDM-2, hypothyroidism, lumbar laminectomy and decompression and mild cognitive impairment brought to ED by EMS after syncopal episode.    Patient sustained facial injury, CT head cervical and maxillofacial showed 7 mm focus of acute IPH over the left frontal white matter, minimal acute SAH over the distal right frontal lobe, depressed comminuted left orbital floor fracture with bilateral entrapment of inferior rectus muscle, fracture of medial wall of the left orbit without entrapment of intraorbital contents, minimally displaced nasal bone fracture as well as fracture of junction of left nasal bone to the frontal process of left maxilla. Neurosurgery consulted recommended to hold anticoagulation, syncope workup.  ENT and ophthalmology evaluated the patient.  Admitted to TRH service for further management and syncope evaluation. ENT repaired lacerations. Cardiology placed ILR.  Assessment and Plan: Syncope and collapse Presented with a witnessed syncopal event with loss of consciousness and head trauma. Found to have IPH and contusion likely due to trauma, but no evidence of stroke or other etiologies to explain syncope on MRI brain. No evidence of reoccurrence of seizures here. Preserved EF on echocardiogram, no significant valvular abnormality. Cardiology/EP was consulted, ILR was recommended and placed. Concern for recurrence of A-fib as a cause of her syncope.   Orbital floor fracture, nasal bone fracture Left inferior rectus muscle entrapment. CT maxillofacial Depressed comminuted left orbital floor fracture with partial entrapment of  inferior rectus muscle. Fracture of the medial wall the left orbit without entrapment of intraorbital contents. Minimally displaced nasal bone fracture as well as fracture of the junction of the left nasal bone to the frontal process of the left maxilla. seen by ENT Dr. Tobie, repaired laceration injuries. Also seen by ophthalmology. Treated with Unasyn . Patient reported back pain as well as shoulder pain.  X-ray of the thoracic and lumbar spine as well as right shoulder were negative for any acute abnormality.  ENT recommending Peridex  twice daily, Augmentin twice daily 7-day, bacitracin  twice daily 7-day.  EOT 10/2. No nose blowing for 2 weeks, as needed Afrin for epistaxis Soft diet for 5-day and then advance to regular. ENT follow-up in 2 to 3 weeks. Ophthalmology Dr. Corbin recommended outpatient follow-up.  Suspect entrapment is unlikely by clinical exam.   Acute intra parenchymal hemorrhage on warfarin. Surrounding cerebral edema Seen by neurosurgery Dr. Onetha. Repeat MRI reviewed by neurosurgery, stable. No surgical intervention.   Cervical spine MRI unremarkable.  Cervical collar removed. Discussed with Dr. Onetha, initiate warfarin 72 hours after initial injury. Repeat CT scan performed on 10/2 due to patient complaining of worsening headache which shows no new areas of intracranial hemorrhage and expected evolution of the prior hemorrhages seen. Due to prophylaxis now on hold again.   Hypokalemia Corrected.  Abnormal lab. On 10/6 patient's INR came back as more than detectable range more than 10. Patient also reported some epistaxis. Patient was given vitamin K and FFP were ordered. Patient received one fourth of the vitamin K when a recheck INR came back as 1.2 only. Epistaxis have resolved for now. For now we will be holding off on warfarin. Monitor daily INR.  Epistaxis. Afrin as needed. Monitor for resolution before initiating Coumadin .   Chronic HFpEF Chronic  RV  systolic dysfunction. Rheumatic valvular disease including MS/AI/AS s/p pericardial MVR, pericardial AVR 2014  Appears euvolemic. Current and prior echo shows right ventricular systolic function is moderately reduced. Continue torsemide , hold Farxiga .  Losartan , Bisoprolol  on hold.  Paroxysmal A-fib/flutter First-degree AV block Prolonged QT-last QTc 459 on 9/28. Continue amiodarone . Resume Coumadin   Confirmed with EP no other options for anticoagulation.   Essential hypertension Continue torsemide , hold Farxiga . Losartan , Bisoprolol  on hold.   CKD stage IIIb: Baseline creatinine around 1.5.  On admission serum creatinine 1.7 Now trending down below baseline. Avoid nephrotoxic drugs.   Hypothyroidism:  continue home dose Synthroid  resumed.   GERD Continue PPI  Type 2 diabetes mellitus, uncontrolled with hyperglycemia with CKD without long-term insulin  use. Hemoglobin A1c 7.4 in May. CBG remains elevated. On Farxiga  at home.  Currently on sliding scale insulin .  Witnessed fall. Patient had reportedly a fall in the hospital.  She was trying to walk around and stood up and suddenly her legs gave and she was lowered down with assistance.  No injury was reported by the staff. Upon evaluation patient denies any injury.  Patient reports fatigue. No new focal deficit on exam. Reports headache after the fall.  Repeat CT scan actually shows resolution of IPH. No neuro events overnight.  Headache resolved. Will resume warfarin.  Nosebleed. Only 1 event.  On 10/6 morning. Monitor.   Subjective: No nausea no vomiting no fever no chills.  Denies any other acute complaint.  Physical Exam: Clear to auscultation but S1-S2 present Bowel sound present  Data Reviewed: I have Reviewed nursing notes, Vitals, and Lab results. Since last encounter, pertinent lab results CBC and BMP   . I have ordered test including CBC BMP INR  .   Disposition: Status is: Inpatient Remains inpatient  appropriate because: Awaiting appeal for CIR placement  SCDs Start: 06/28/24 1633   Family Communication: Husband at bedside  Level of care: Progressive   Vitals:   07/08/24 2032 07/08/24 2305 07/09/24 0346 07/09/24 0750  BP:  131/61 (!) 147/66 104/84  Pulse: 79 82 83 83  Resp: 16 19 18 18   Temp:  98.9 F (37.2 C) 98.2 F (36.8 C) 97.6 F (36.4 C)  TempSrc:  Oral Oral Oral  SpO2: 92% 93% 91%   Weight:      Height:         Author: Yetta Blanch, MD 07/09/2024 6:40 PM  Please look on [email or web address] to find out who is on call.

## 2024-07-09 NOTE — Progress Notes (Signed)
 Inpatient Rehab Admissions Coordinator:   Appeal pending.  Provided verbal updates to [place] at [facility] grievance and appeals dept per her request this AM.  Will follow.   Reche Lowers, PT, DPT Admissions Coordinator [phone] 07/09/24  10:54 AM

## 2024-07-09 NOTE — TOC Initial Note (Addendum)
 Transition of Care [facility]) - Initial/Assessment Note    Patient Details  Name: [name] MRN: [id number] Date of Birth: Feb 26, 1946  [facility] CM/SW Contact:    Montie LOISE Louder, LCSW Phone Number: 07/09/2024, 1:13 PM  Clinical Narrative:       CSW met with patient and her spouse, Lamar. CSW met with patient to confirmed disposition plan if the appeal for CIR his upheld. Patient and spouse confirmed the plan will be to return home if unable to discharge to CIR.  Montie Louder, MSW, LCSW Clinical Social Worker               Expected Discharge Plan: IP Rehab Facility Barriers to Discharge: Continued Medical Work up   Patient Goals and CMS Choice            Expected Discharge Plan and Services In-house Referral: Clinical Social Work     Living arrangements for the past 2 months: Single Family Home                                      Prior Living Arrangements/Services Living arrangements for the past 2 months: Single Family Home Lives with:: Self, Spouse Patient language and need for interpreter reviewed:: No        Need for Family Participation in Patient Care: Yes (Comment) Care giver support system in place?: Yes (comment)   Criminal Activity/Legal Involvement Pertinent to Current Situation/Hospitalization: No - Comment as needed  Activities of Daily Living   ADL Screening (condition at time of admission) Independently performs ADLs?: Yes (appropriate for developmental age) Is the patient deaf or have difficulty hearing?: No Does the patient have difficulty seeing, even when wearing glasses/contacts?: Yes Does the patient have difficulty concentrating, remembering, or making decisions?: Yes  Permission Sought/Granted Permission sought to share information with : Family Supports Permission granted to share information with : Yes, Verbal Permission Granted  Share Information with NAME: Daniele Dillow     Permission granted to  share info w Relationship: spouse  Permission granted to share info w Contact Information: [phone]  Emotional Assessment Appearance:: Appears stated age Attitude/Demeanor/Rapport: Engaged Affect (typically observed): Appropriate Orientation: : Oriented to Self, Oriented to Place, Oriented to  Time, Oriented to Situation Alcohol / Substance Use: Not Applicable Psych Involvement: No (comment)  Admission diagnosis:  Traumatic intracerebral hemorrhage with loss of consciousness, unspecified laterality, initial encounter (HCC) [S06.369A] Intraparenchymal hematoma of brain (HCC) [S06.33AA] Closed fracture of orbital floor, unspecified laterality, initial encounter (HCC) [S02.30XA] Patient Active Problem List   Diagnosis Date Noted   Fracture of orbital floor, closed (HCC) 06/29/2024   Injury of face 06/29/2024   Lip laceration 06/29/2024   Chin laceration 06/29/2024   Intraparenchymal hematoma of brain (HCC) 06/28/2024   Heart failure with recovered ejection fraction (HFrecEF) (HCC) 06/28/2024   Atypical atrial flutter (HCC) 01/04/2023   Syncope 11/11/2022   Acute respiratory failure with hypoxia (HCC) 03/31/2022   Acute congestive heart failure (HCC) 03/31/2022   Pneumonia 03/31/2022   RSV infection 06/03/2021   COPD with acute exacerbation (HCC) 06/01/2021   Type 2 diabetes mellitus treated without insulin  (HCC) 06/01/2021   COPD exacerbation (HCC) 06/01/2021   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   Asthma exacerbation 11/12/2016   Synovial cyst of lumbar spine 05/13/2014   Encounter for therapeutic drug monitoring 11/06/2013   Hyponatremia    Atrial fibrillation (HCC)  Moderate aortic stenosis 07/12/2013   Moderate aortic insufficiency 07/12/2013   S/P MVR (mitral valve replacement) 07/12/2013   Severe mitral valve stenosis 07/09/2013   Mitral stenosis 07/08/2013   Chest pain 07/07/2013   Asthma, cough variant 03/27/2013   Upper airway cough syndrome in pt with mild  cough variant asthma  09/17/2012   Dyspnea on exertion 06/13/2012   Edema 06/13/2012   Rheumatic heart disease 06/13/2012   Fasting hyperglycemia    Chronic anticoagulation 01/26/2012   Hypothyroidism 01/23/2012   Cardioembolic stroke (HCC) 01/02/2012   Hyperlipidemia    Essential hypertension    Breast carcinoma (HCC)    Gastroesophageal reflux disease    PCP:  Sheryle Carwin, MD Pharmacy:   [facility] - [place], [place] - [address] [place] [address] Phone: [phone] Fax: [phone]     Social Drivers of Health (SDOH) Social History: SDOH Screenings   Food Insecurity: Patient Declined (06/28/2024)  Housing: Unknown (06/28/2024)  Transportation Needs: Patient Declined (06/28/2024)  Utilities: Patient Declined (06/28/2024)  Social Connections: Patient Declined (06/28/2024)  Tobacco Use: Low Risk  (06/28/2024)   SDOH Interventions:     Readmission Risk Interventions    11/14/2022   11:52 AM  Readmission Risk Prevention Plan  Transportation Screening Complete  PCP or Specialist Appt within 3-5 Days Complete  HRI or Home Care Consult Complete  Social Work Consult for Recovery Care Planning/Counseling --  Palliative Care Screening Not Applicable  Medication Review Oceanographer) Complete

## 2024-07-09 NOTE — Plan of Care (Signed)
  Problem: Tissue Perfusion: Goal: Adequacy of tissue perfusion will improve Outcome: Progressing   Problem: Coping: Goal: Level of anxiety will decrease Outcome: Progressing   Problem: Pain Managment: Goal: General experience of comfort will improve and/or be controlled Outcome: Progressing   Problem: Safety: Goal: Ability to remain free from injury will improve Outcome: Progressing

## 2024-07-09 NOTE — Progress Notes (Signed)
 PHARMACY - ANTICOAGULATION CONSULT NOTE  Pharmacy Consult for warfarin  Indication: AVR/MVR, afib  No Known Allergies  Patient Measurements: Height: 5' 3 (160 cm) Weight: 68 kg (150 lb) IBW/kg (Calculated) : 52.4 HEPARIN  DW (KG): 66.3  Vital Signs: Temp: 97.6 F (36.4 C) (10/07 0750) Temp Source: Oral (10/07 0750) BP: 104/84 (10/07 0750) Pulse Rate: 83 (10/07 0750)  Labs: Recent Labs    07/07/24 0411 07/08/24 9351 07/08/24 0948 07/08/24 1953 07/08/24 2341 07/09/24 0625  HGB 9.8* 10.4*  --   --   --   --   HCT 32.5* 34.4*  --   --   --   --   PLT 129* 145*  --   --   --   --   LABPROT 14.8 >90.0*   < > 15.6* 15.9* 14.6  INR 1.1 >10.0*   < > 1.2 1.2 1.1  CREATININE 1.29* 1.33*  --   --   --   --    < > = values in this interval not displayed.    Estimated Creatinine Clearance: 31.2 mL/min (A) (by C-G formula based on SCr of 1.33 mg/dL (H)).  Assessment: 78 yo W on warfarin PTA for prosthetic AVR/MVR and Afib. Patient admitted 9/26 with syncope and fall causing multiple facial fractures and IPH. Concern for Afib causing syncope.  Pharmacy consulted for warfarin dosing 10/4.   INR reported as > 10 on 10/6.  In setting on IPH on admission and nosebleed, Vitamin K 5mg  IV x 1 dose given while awaiting repeat INR, which returned at 1.2.  It might take longer to get INR therapeutic s/p Vitamin K.  INR remains sub-therapeutic.  No further bleeding per patient.  PTA warfarin 1.25 Thur/Sun, 2.5mg  AOD  Goal of Therapy:  INR 2.5-3.5 Monitor platelets by anticoagulation protocol: Yes   Plan:  Coumadin  5mg  PO today Daily PT / INR  Wilfredo Canterbury D. Lendell, PharmD, BCPS, BCCCP 07/09/2024, 9:46 AM

## 2024-07-09 NOTE — Progress Notes (Signed)
 Physical Therapy Treatment Patient Details Name: [name] MRN: [id number] DOB: 01/27/1946 Today's Date: 07/09/2024   History of Present Illness Pt is an 78 y.o. female presenting 9/26 after ground level fall. MRI brain with 1.2 cm acute intraparenchymal hemorrhage at the subcortical L frontal lobe, likely a small hemorrhagic contusion; intraventricular hemorrhage layering within the occipital horns of both lateral ventricles. L orbital fx PMH: HFpEF, A-fib/flutter on warfarin, rheumatic heart disease, AV and MV replacement, COPD, CVA, carotid artery stenosis, CKD-3B, NIDDM-2, hypothyroidism, lumbar laminectomy and decompression and mild cognitive impairment    PT Comments  Pt is limited by fatigue at this time, although still agreeable to ambulation. Pt ambulates for short household distances with support of RW. Pt continues to require verbal cues for hand placement on RW and does not retain cues provided within session. Patient will benefit from intensive inpatient follow-up therapy, >3 hours/day.    If plan is discharge home, recommend the following: A little help with walking and/or transfers;A lot of help with bathing/dressing/bathroom;Assistance with cooking/housework;Direct supervision/assist for medications management;Direct supervision/assist for financial management;Assist for transportation;Help with stairs or ramp for entrance;Supervision due to cognitive status   Can travel by private vehicle        Equipment Recommendations  Rolling walker (2 wheels)    Recommendations for Other Services       Precautions / Restrictions Precautions Precautions: Fall Recall of Precautions/Restrictions: Impaired Precaution/Restrictions Comments: ?syncope prior to fall; husband uncertain. Restrictions Weight Bearing Restrictions Per Provider Order: No     Mobility  Bed Mobility                    Transfers Overall transfer level: Needs assistance Equipment used: Rolling  walker (2 wheels) Transfers: Sit to/from Stand Sit to Stand: Contact guard assist           General transfer comment: pt with poor recall of hand placement during transfers, continues to place BUE on RW    Ambulation/Gait Ambulation/Gait assistance: Contact guard assist Gait Distance (Feet): 70 Feet Assistive device: Rolling walker (2 wheels) Gait Pattern/deviations: Step-through pattern Gait velocity: reduced Gait velocity interpretation: <1.8 ft/sec, indicate of risk for recurrent falls   General Gait Details: slowed step-through gait, increased time to turn   Stairs             Wheelchair Mobility     Tilt Bed    Modified Rankin (Stroke Patients Only)       Balance Overall balance assessment: Needs assistance Sitting-balance support: No upper extremity supported, Feet supported Sitting balance-Leahy Scale: Good     Standing balance support: Reliant on assistive device for balance, Bilateral upper extremity supported Standing balance-Leahy Scale: Poor                              Communication Communication Communication: No apparent difficulties  Cognition Arousal: Alert Behavior During Therapy: Flat affect   PT - Cognitive impairments: History of cognitive impairments, Memory                       PT - Cognition Comments: per spouse pt has had memory problems since her CVA Following commands: Intact      Cueing Cueing Techniques: Verbal cues  Exercises      General Comments General comments (skin integrity, edema, etc.): pt in NAD, reports fatigue after ambulation bout      Pertinent Vitals/Pain Pain Assessment Pain Assessment:  No/denies pain    Home Living                          Prior Function            PT Goals (current goals can now be found in the care plan section) Acute Rehab PT Goals Patient Stated Goal: feel better Progress towards PT goals: Progressing toward goals    Frequency     Min 2X/week      PT Plan      Co-evaluation              AM-PAC PT 6 Clicks Mobility   Outcome Measure  Help needed turning from your back to your side while in a flat bed without using bedrails?: A Little Help needed moving from lying on your back to sitting on the side of a flat bed without using bedrails?: A Little Help needed moving to and from a bed to a chair (including a wheelchair)?: A Little Help needed standing up from a chair using your arms (e.g., wheelchair or bedside chair)?: A Little Help needed to walk in hospital room?: A Little Help needed climbing 3-5 steps with a railing? : A Lot 6 Click Score: 17    End of Session Equipment Utilized During Treatment: Gait belt Activity Tolerance: Patient limited by fatigue Patient left: in chair;with call bell/phone within reach;with chair alarm set;with family/visitor present Nurse Communication: Mobility status PT Visit Diagnosis: Unsteadiness on feet (R26.81);History of falling (Z91.81);Pain     Time: 8955-8941 PT Time Calculation (min) (ACUTE ONLY): 14 min  Charges:    $Gait Training: 8-22 mins PT General Charges $$ ACUTE PT VISIT: 1 Visit                     [name], PT, DPT Acute Rehabilitation Office [phone]   [name] 07/09/2024, 12:55 PM

## 2024-07-10 ENCOUNTER — Other Ambulatory Visit (HOSPITAL_COMMUNITY): Payer: Self-pay

## 2024-07-10 DIAGNOSIS — S06321A Contusion and laceration of left cerebrum with loss of consciousness of 30 minutes or less, initial encounter: Secondary | ICD-10-CM | POA: Diagnosis not present

## 2024-07-10 LAB — COMPREHENSIVE METABOLIC PANEL WITH GFR
ALT: 26 U/L (ref 0–44)
AST: 32 U/L (ref 15–41)
Albumin: 2.9 g/dL — ABNORMAL LOW (ref 3.5–5.0)
Alkaline Phosphatase: 62 U/L (ref 38–126)
Anion gap: 10 (ref 5–15)
BUN: 11 mg/dL (ref 8–23)
CO2: 23 mmol/L (ref 22–32)
Calcium: 8.5 mg/dL — ABNORMAL LOW (ref 8.9–10.3)
Chloride: 103 mmol/L (ref 98–111)
Creatinine, Ser: 1.28 mg/dL — ABNORMAL HIGH (ref 0.44–1.00)
GFR, Estimated: 42 mL/min — ABNORMAL LOW (ref >60)
Glucose, Bld: 125 mg/dL — ABNORMAL HIGH (ref 70–99)
Potassium: 3.4 mmol/L — ABNORMAL LOW (ref 3.5–5.1)
Sodium: 136 mmol/L (ref 135–145)
Total Bilirubin: 1.1 mg/dL (ref 0.0–1.2)
Total Protein: 6 g/dL — ABNORMAL LOW (ref 6.5–8.1)

## 2024-07-10 LAB — CBC WITH DIFFERENTIAL/PLATELET
Abs Immature Granulocytes: 0.03 K/uL (ref 0.00–0.07)
Basophils Absolute: 0.1 K/uL (ref 0.0–0.1)
Basophils Relative: 2 %
Eosinophils Absolute: 0.3 K/uL (ref 0.0–0.5)
Eosinophils Relative: 4 %
HCT: 34.2 % — ABNORMAL LOW (ref 36.0–46.0)
Hemoglobin: 10.4 g/dL — ABNORMAL LOW (ref 12.0–15.0)
Immature Granulocytes: 0 %
Lymphocytes Relative: 13 %
Lymphs Abs: 0.9 K/uL (ref 0.7–4.0)
MCH: 26.7 pg (ref 26.0–34.0)
MCHC: 30.4 g/dL (ref 30.0–36.0)
MCV: 87.9 fL (ref 80.0–100.0)
Monocytes Absolute: 0.6 K/uL (ref 0.1–1.0)
Monocytes Relative: 9 %
Neutro Abs: 4.9 K/uL (ref 1.7–7.7)
Neutrophils Relative %: 72 %
Platelets: 149 K/uL — ABNORMAL LOW (ref 150–400)
RBC: 3.89 MIL/uL (ref 3.87–5.11)
RDW: 18.8 % — ABNORMAL HIGH (ref 11.5–15.5)
WBC: 6.9 K/uL (ref 4.0–10.5)
nRBC: 0 % (ref 0.0–0.2)

## 2024-07-10 LAB — PREPARE FRESH FROZEN PLASMA: Unit division: 0

## 2024-07-10 LAB — PROTIME-INR
INR: 1.2 (ref 0.8–1.2)
Prothrombin Time: 15.5 s — ABNORMAL HIGH (ref 11.4–15.2)

## 2024-07-10 LAB — BPAM FFP
Blood Product Expiration Date: 202510102359
Blood Product Expiration Date: 202510102359
Blood Product Expiration Date: 202510122359
ISSUE DATE / TIME: 202510061029
ISSUE DATE / TIME: 202510072245
Unit Type and Rh: 6200
Unit Type and Rh: 6200
Unit Type and Rh: 6200

## 2024-07-10 LAB — GLUCOSE, CAPILLARY
Glucose-Capillary: 217 mg/dL — ABNORMAL HIGH (ref 70–99)
Glucose-Capillary: 121 mg/dL — ABNORMAL HIGH (ref 70–99)

## 2024-07-10 LAB — MAGNESIUM: Magnesium: 1.8 mg/dL (ref 1.7–2.4)

## 2024-07-10 MED ORDER — POTASSIUM CHLORIDE CRYS ER 20 MEQ PO TBCR: 20.0000 meq | EXTENDED_RELEASE_TABLET | Freq: Every day | ORAL | Status: DC

## 2024-07-10 MED ORDER — MAGNESIUM SULFATE 2 GM/50ML IV SOLN
2.0000 g | Freq: Once | INTRAVENOUS | Status: AC
  Administered 2024-07-10: 2 g via INTRAVENOUS
  Filled 2024-07-10: qty 50

## 2024-07-10 MED ORDER — OXYCODONE HCL 5 MG PO TABS
5.0000 mg | ORAL_TABLET | Freq: Four times a day (QID) | ORAL | 0 refills | Status: DC | PRN
  Filled 2024-07-10: qty 20, 5d supply, fill #0

## 2024-07-10 MED ORDER — WARFARIN SODIUM 5 MG PO TABS
5.0000 mg | ORAL_TABLET | Freq: Once | ORAL | Status: AC
  Administered 2024-07-10: 5 mg via ORAL
  Filled 2024-07-10: qty 1

## 2024-07-10 MED ORDER — POTASSIUM CHLORIDE CRYS ER 20 MEQ PO TBCR
40.0000 meq | EXTENDED_RELEASE_TABLET | ORAL | Status: AC
  Administered 2024-07-10 (×2): 40 meq via ORAL
  Filled 2024-07-10 (×2): qty 2

## 2024-07-10 MED ORDER — OXYMETAZOLINE HCL 0.05 % NA SOLN
1.0000 | NASAL | 0 refills | Status: DC | PRN
  Filled 2024-07-10 (×2): qty 30, 5d supply, fill #0

## 2024-07-10 NOTE — Progress Notes (Signed)
 PHARMACY - ANTICOAGULATION CONSULT NOTE  Pharmacy Consult for warfarin  Indication: AVR/MVR, afib  No Known Allergies  Patient Measurements: Height: 5' 3 (160 cm) Weight: 68 kg (150 lb) IBW/kg (Calculated) : 52.4 HEPARIN  DW (KG): 66.3  Vital Signs: Temp: 98.6 F (37 C) (10/08 0744) Temp Source: Oral (10/08 0744) BP: 147/91 (10/08 0744) Pulse Rate: 84 (10/08 0744)  Labs: Recent Labs    07/08/24 0648 07/08/24 0948 07/08/24 2341 07/09/24 0625 07/10/24 0342  HGB 10.4*  --   --   --  10.4*  HCT 34.4*  --   --   --  34.2*  PLT 145*  --   --   --  149*  LABPROT >90.0*   < > 15.9* 14.6 15.5*  INR >10.0*   < > 1.2 1.1 1.2  CREATININE 1.33*  --   --   --  1.28*   < > = values in this interval not displayed.    Estimated Creatinine Clearance: 32.4 mL/min (A) (by C-G formula based on SCr of 1.28 mg/dL (H)).  Assessment: 78 yo W on warfarin PTA for prosthetic AVR/MVR and Afib. Patient admitted 9/26 with syncope and fall causing multiple facial fractures and IPH. Concern for Afib causing syncope.  Pharmacy consulted for warfarin dosing 10/4.   INR reported as > 10 on 10/6.  In setting on IPH on admission and nosebleed, Vitamin K 5mg  IV x 1 dose given while awaiting repeat INR, which returned at 1.2.  It might take longer to get INR therapeutic s/p Vitamin K.  INR remains sub-therapeutic.  No further bleeding reported.  PTA warfarin 1.25 Thur/Sun, 2.5mg  AOD  Goal of Therapy:  INR 2.5-3.5 Monitor platelets by anticoagulation protocol: Yes   Plan:  Repeat Coumadin  5mg  PO today Daily PT / INR  Darnisha Vernet D. Lendell, PharmD, BCPS, BCCCP 07/10/2024, 9:12 AM

## 2024-07-10 NOTE — Discharge Summary (Signed)
 Physician Discharge Summary  [name] FMW:[id number] DOB: 06/19/1946 DOA: 06/28/2024  PCP: Sheryle Carwin, MD  Admit date: 06/28/2024 Discharge date: 07/10/2024  Admitted From: home Disposition:  home, insurance refused to approve inpatient rehab  Recommendations for Outpatient Follow-up:  Follow up with PCP in 1-2 weeks Follow up with ophthalmology and ENT in 1-2 weeks  Home Health: PT Equipment/Devices: none  Discharge Condition: stable CODE STATUS: Full code Diet Orders (From admission, onward)     Start     Ordered   07/06/24 0721  Diet regular Room service appropriate? Yes; Fluid consistency: Thin  Diet effective now       Question Answer Comment  Room service appropriate? Yes   Fluid consistency: Thin      07/06/24 0720           Hospital course: [name] is a 78 y.o. female with PMH of HFpEF, A-fib/flutter on warfarin, rheumatic heart disease, AV and MV replacement, COPD, CVA, carotid artery stenosis, CKD-3B, NIDDM-2, hypothyroidism, lumbar laminectomy and decompression and mild cognitive impairment brought to ED by EMS after syncopal episode. Patient sustained facial injury, CT head cervical and maxillofacial showed 7 mm focus of acute IPH over the left frontal white matter, minimal acute SAH over the distal right frontal lobe, depressed comminuted left orbital floor fracture with bilateral entrapment of inferior rectus muscle, fracture of medial wall of the left orbit without entrapment of intraorbital contents, minimally displaced nasal bone fracture as well as fracture of junction of left nasal bone to the frontal process of left maxilla. Neurosurgery consulted recommended to hold anticoagulation, syncope workup.  ENT and ophthalmology evaluated the patient.  Admitted to [facility] service for further management and syncope evaluation. ENT repaired lacerations. Cardiology placed ILR.  Hospital Course / Discharge diagnoses: Principal problem Syncope and collapse -  patient presented to the hospital with a witnessed syncopal event with loss of consciousness and head trauma. She was found to have IPH and contusion likely due to trauma, but no evidence of stroke or other etiologies to explain syncope on MRI brain. No evidence of reoccurrence of syncope here. She underwent a 2d echo which showed preserved EF, no significant valvular abnormality. Cardiology/EP was consulted, ILR was recommended and placed. Concern for recurrence of A-fib as a cause of her syncope.   Active problems Orbital floor fracture, nasal bone fracture, Left inferior rectus muscle entrapment - CT maxillofacial on admission showed depressed comminuted left orbital floor fracture with partial entrapment of inferior rectus muscle. Fracture of the medial wall the left orbit without entrapment of intraorbital contents. Minimally displaced nasal bone fracture as well as fracture of the junction of the left nasal bone to the frontal process of the left maxilla. She was seen by ENT Dr. Tobie, repaired laceration injuries. Also seen by ophthalmology. Treated with Unasyn . Patient reported back pain as well as shoulder pain.  X-ray of the thoracic and lumbar spine as well as right shoulder were negative for any acute abnormality. She was treated with antibiotics and finished a course while hospitalized. She will need follow up with ENT and ophthalmology in 1-2 weeks. Suspect entrapment is unlikely by clinical exam. Acute intra parenchymal hemorrhage on warfarin, surrounding cerebral edema - Seen by neurosurgery Dr. Onetha. Repeat MRI reviewed by neurosurgery, stable. No surgical intervention.  Cervical spine MRI unremarkable.  Cervical collar removed. At 72h after injury, per neurosurgery recommendations, warfarin was resumed, she is tolerating well Hypokalemia - Corrected prior to dc, resume home medications  on dc Epistaxis - Afrin as needed. Chronic HFpEF Chronic RV systolic dysfunction. Rheumatic valvular  disease including MS/AI/AS s/p pericardial MVR, pericardial AVR 2014  - Appears euvolemic. Hold ARB on dc due to being normotensive, and would like to avoid hypotension at home, recommend outpatient follow up with PCP for BP monitoring and resumption if BP is stable Paroxysmal A-fib/flutter First-degree AV block Prolonged QT-last QTc 459 on 9/28. - Continue amiodarone . Essential hypertension - Continue home medications except ARB as above CKD stage IIIb - Baseline creatinine around 1.5.  At baseline  Hypothyroidism - continue home dose Synthroid   Type 2 diabetes mellitus, uncontrolled with hyperglycemia with CKD without long-term insulin  use - Hemoglobin A1c 7.4 in May. Resume home medications  Sepsis ruled out   Discharge Instructions   Allergies as of 07/10/2024   No Known Allergies      Medication List     STOP taking these medications    losartan  25 MG tablet Commonly known as: COZAAR        TAKE these medications    acetaminophen  325 MG tablet Commonly known as: Tylenol  Take 2 tablets (650 mg total) by mouth every 6 (six) hours as needed.   albuterol  108 (90 Base) MCG/ACT inhaler Commonly known as: VENTOLIN  HFA Inhale 2 puffs into the lungs every 6 (six) hours as needed for wheezing or shortness of breath.   amiodarone  200 MG tablet Commonly known as: Pacerone  Take 1 tablet (200 mg total) by mouth daily. What changed: when to take this   atorvastatin  40 MG tablet Commonly known as: LIPITOR Take 1 tablet (40 mg total) by mouth daily. What changed: when to take this   azelastine  0.1 % nasal spray Commonly known as: ASTELIN  Place 1 spray into both nostrils 2 (two) times daily. Use in each nostril as directed   bisoprolol  5 MG tablet Commonly known as: ZEBETA  Take 1 tablet (5 mg total) by mouth daily. What changed: when to take this   budesonide -formoterol  80-4.5 MCG/ACT inhaler Commonly known as: Symbicort  One puff first thing in am and 12 hours  later What changed: additional instructions   calcium  carbonate 500 MG chewable tablet Commonly known as: TUMS - dosed in mg elemental calcium  Chew 2-3 tablets by mouth as needed for indigestion or heartburn.   dextromethorphan  15 MG/5ML syrup Take 10 mLs by mouth as needed for cough.   escitalopram  10 MG tablet Commonly known as: LEXAPRO  Take 10 mg by mouth daily with breakfast.   famotidine  20 MG tablet Commonly known as: PEPCID  TAKE ONE TABLET BY MOUTH EVERY DAY AFTER SUPPER   Farxiga  10 MG Tabs tablet Generic drug: dapagliflozin  propanediol Take 10 mg by mouth in the morning.   fexofenadine 180 MG tablet Commonly known as: ALLEGRA Take 180 mg by mouth in the morning.   fluticasone  50 MCG/ACT nasal spray Commonly known as: FLONASE  Place 1 spray into both nostrils in the morning.   levothyroxine  100 MCG tablet Commonly known as: SYNTHROID  Take 100 mcg by mouth daily before breakfast.   metFORMIN  500 MG tablet Commonly known as: GLUCOPHAGE  Take 1 tablet (500 mg total) by mouth 2 (two) times daily with a meal.   multivitamin with minerals Tabs tablet Take 1 tablet by mouth in the morning.   oxyCODONE  5 MG immediate release tablet Commonly known as: Oxy IR/ROXICODONE  Take 1 tablet (5 mg total) by mouth every 6 (six) hours as needed for moderate pain (pain score 4-6).   oxymetazoline 0.05 % nasal spray Commonly known  as: AFRIN Place 1 spray into both nostrils every 20 (twenty) minutes as needed (nose bleed.).   pantoprazole  40 MG tablet Commonly known as: PROTONIX  TAKE 1 TABLET 30 TO 60 MINUTES BEFORE FIRST MEAL OF THE DAY. What changed: See the new instructions.   potassium chloride  SA 20 MEQ tablet Commonly known as: KLOR-CON  M Take 1 tablet (20 mEq total) by mouth daily. What changed: when to take this   SYSTANE OP Place 1 drop into both eyes daily as needed (dry eyes).   torsemide  20 MG tablet Commonly known as: DEMADEX  Take 20mg  (1 tab) by mouth  every other day alternating with 40mg  (2 tabs) every other day What changed:  how much to take how to take this when to take this additional instructions   Trelegy Ellipta  100-62.5-25 MCG/ACT Aepb Generic drug: Fluticasone -Umeclidin-Vilant Inhale 1 puff into the lungs daily.   warfarin 2.5 MG tablet Commonly known as: COUMADIN  Take as directed. If you are unsure how to take this medication, talk to your nurse or doctor. Original instructions: TAKE AS DIRECTED BY COUMADIN  CLINIC. What changed: See the new instructions.        Follow-up Information     Tobie Eldora NOVAK, MD Follow up in 1 week(s).   Specialty: Otolaryngology Contact information: [address] [place] [address] [phone]         Corbin Mabel NOVAK, MD Follow up in 1 week(s).   Specialty: Ophthalmology Contact information: [address] [place] [address] [phone]                 Consultations: Neurosurgery  ENT Ophthalmology   Procedures/Studies:  CT HEAD WO CONTRAST ( ) Result Date: 07/06/2024 EXAM: CT HEAD WITHOUT CONTRAST 07/06/2024 07:57:24 PM TECHNIQUE: CT of the head was performed without the administration of intravenous contrast. Automated exposure control, iterative reconstruction, and/or weight based adjustment of the mA/kV was utilized to reduce the radiation dose to as low as reasonably achievable. COMPARISON: 07/04/2024 CLINICAL HISTORY: Ataxia, head trauma. FINDINGS: BRAIN AND VENTRICLES: No acute hemorrhage. Near complete resolution of left frontal lobe intraparenchymal hemorrhage. Volume loss and chronic ischemic white matter changes. Old right frontal lobe infarct. No evidence of acute infarct. No hydrocephalus. No extra-axial collection. No mass effect or midline shift. ORBITS: Incompletely visualized left orbital floor fracture. SINUSES: No acute abnormality. SOFT TISSUES AND SKULL: No acute soft tissue abnormality. No skull fracture.  IMPRESSION: 1. Near complete resolution of left frontal lobe intraparenchymal hemorrhage. 2. Incompletely visualized left orbital floor fracture. 3. Old right frontal lobe infarct. 4. Volume loss and chronic ischemic white matter changes. Electronically signed by: Franky Stanford MD 07/06/2024 08:23 PM EDT RP Workstation: [id number]   CT HEAD WO CONTRAST ( ) Result Date: 07/04/2024 EXAM: CT HEAD WITHOUT CONTRAST 07/04/2024 02:13:14 PM TECHNIQUE: CT of the head was performed without the administration of intravenous contrast. Automated exposure control, iterative reconstruction, and/or weight based adjustment of the mA/kV was utilized to reduce the radiation dose to as low as reasonably achievable. COMPARISON: MRI head and CT head 06/28/2024. CLINICAL HISTORY: Headache, sudden, severe. FINDINGS: BRAIN AND VENTRICLES: Redemonstrated focus of intraparenchymal hemorrhage in the left frontal lobe white matter which measures 8 x 3 x 8 mm on the current study, previously measuring 7 x 5 x 6 mm. The focus of hemorrhage appears slightly more diffuse and decreased in attenuation compared to prior. There is mild surrounding edema. There are no new areas of intracranial hemorrhage. Nonspecific hypoattenuation in the periventricular and subcortical  white matter, most likely representing chronic microvascular ischemic changes. Redemonstrated encephalomalacia in the right superior frontal gyrus suggestive of remote infarct. Additional remote infarcts in the left cerebellum. Mild parenchymal volume loss. No hydrocephalus. No extra-axial collection. No mass effect or midline shift. ORBITS: Focal hematoma over the left supraorbital ridge partially extending into the preseptal soft tissues of the left orbit. Left orbital floor fracture and fracture of the left lamina papyracea, better evaluated on recent CT maxillofacial. SINUSES: Fluid and mucosal thickening in the left ethmoid and left maxillary sinuses. SOFT TISSUES AND SKULL:  Focal hematoma in the left forehead. Edema in the partially visualized left facial soft tissues. No skull fracture. IMPRESSION: 1. Evolving intraparenchymal hemorrhage in the left frontal lobe with mild surrounding edema. 2. No new areas of intracranial hemorrhage. 3. Left orbital floor fracture and fracture of the left lamina papyracea, better evaluated on recent CT maxillofacial. 4. Focal hematoma in the left forehead and over the left supraorbital ridge. 5. Chronic changes as above. Electronically signed by: Donnice Mania MD 07/04/2024 02:51 PM EDT RP Workstation: [id number]   DG Thoracic Spine 2 View Result Date: 07/03/2024 EXAM: 2 VIEW(S) XRAY OF THE THORACIC SPINE 07/03/2024 04:42:00 PM COMPARISON: None available. CLINICAL HISTORY: Fall 190176; 91320 Back pain [id number]. Reason for exam: Fall on 07/03/2024. FINDINGS: BONES: No radiographic evidence of compression fracture. No aggressive appearing osseous lesion. Alignment is normal. DISCS AND DEGENERATIVE CHANGES: Mild degenerative endplate osteophytes and disc space narrowing at multiple levels. SOFT TISSUES: Median sternotomy wires aortic and mitral valve replacements partially visualized loop recorder overlying the left chest. The visualized lungs are clear. IMPRESSION: 1. No acute abnormality of the thoracic spine related to the reported fall. 2. Mild degenerative endplate osteophytes and disc space narrowing at multiple levels. Electronically signed by: Donnice Mania MD 07/03/2024 06:17 PM EDT RP Workstation: [id number]   DG Shoulder Right Result Date: 07/03/2024 EXAM: 1 VIEW XRAY OF THE RIGHT SHOULDER 07/03/2024 04:42:00 PM COMPARISON: Shoulder radiograph 2 924. CLINICAL HISTORY: Fall 190176; 91320 Back pain 08679. Reason for exam: Fall on 10.01.25. FINDINGS: BONES AND JOINTS: Glenohumeral joint is normally aligned. No acute fracture or dislocation. Mild degenerative changes of the acromioclavicular joint. Partially imaged median sternotomy wires. SOFT  TISSUES: Right axillary surgical clips noted. No abnormal calcifications. Visualized lung is unremarkable. IMPRESSION: 1. No acute fracture or dislocation. 2. Mild degenerative changes of the acromioclavicular joint. Electronically signed by: Donnice Mania MD 07/03/2024 06:15 PM EDT RP Workstation: [id number]   DG Lumbar Spine 2-3 Views Result Date: 07/03/2024 EXAM: 2 or 3 VIEW(S) XRAY OF THE LUMBAR SPINE 07/03/2024 04:42:00 PM COMPARISON: Lumbar spine radiograph 05/13/2014. CLINICAL HISTORY: Fall on 07/03/2024. Back pain. FINDINGS: LUMBAR SPINE: BONES: No acute fracture. No aggressive appearing osseous lesion. Alignment is normal. Degenerative endplate osteophytes at multiple levels. DISCS AND DEGENERATIVE CHANGES: Facet arthrosis greatest at L4-L5 and L5-S1. There is mild disc space narrowing at L4-L5 and L5-S1. SOFT TISSUES: Vascular calcifications. No acute abnormality. IMPRESSION: 1. No acute abnormality of the lumbar spine related to the fall. 2. Degenerative changes including mild disc space narrowing at L4-5 and L5-S1, endplate osteophytes, and facet arthrosis greatest at L4-5 and L5-S1. Electronically signed by: Donnice Mania MD 07/03/2024 06:13 PM EDT RP Workstation: [id number]   EP PPM/ICD IMPLANT Result Date: 07/02/2024 SURGEON:  Eulas FORBES Furbish, MD   PREPROCEDURE DIAGNOSIS:  Syncope, atrial fibrillation   POSTPROCEDURE DIAGNOSIS: Syncope, atrial fibrillation      PROCEDURES:  1. Implantable loop recorder implantation   INTRODUCTION:  KLOHE LOVERING presents with a history of syncope The costs of loop recorder monitoring have been discussed with the patient.   DESCRIPTION OF PROCEDURE:  Informed written consent was obtained.  A timeout was performed. The patient required no sedation for the procedure today. The patients left chest was prepped and draped in the usual sterile fashion. The skin overlying the left parasternal region was infiltrated with lidocaine  for local analgesia.  A 0.5-cm  incision was made over the left parasternal region over the 3rd intercostal space.  A subcutaneous ILR pocket was fashioned using a combination of sharp and blunt dissection.  A [employer] Reveal LINQ 2 implantable loop recorder was then placed into the pocket  R waves were very prominent and measured >0.83mV.  Steri- Strips and a sterile dressing were then applied.  There were no early apparent complications.   CONCLUSIONS:  1. Successful implantation of a implantable loop recorder for a history of suspected syncope, atrial fibrillation, prolonged QT  2. No early apparent complications. Eulas FORBES Furbish, MD Cardiac Electrophysiology    VAS US  CAROTID Result Date: 07/02/2024 Carotid Arterial Duplex Study Patient Name:  QUNISHA BRYK  Date of Exam:   07/02/2024 Medical Rec #: [id number]        Accession #:    [id number] Date of Birth: 1946-03-22        Patient Gender: F Patient Age:   15 years Exam Location:  [facility] Procedure:      VAS US  CAROTID Referring Phys: MIGNON GONFA --------------------------------------------------------------------------------  Indications:       CVA, Carotid artery disease and Syncope. Risk Factors:      Hypertension, hyperlipidemia, no history of smoking. Comparison Study:  Previous study on 2.9.2024. Performing Technologist: Edilia Elden Appl  Examination Guidelines: A complete evaluation includes B-mode imaging, spectral Doppler, color Doppler, and power Doppler as needed of all accessible portions of each vessel. Bilateral testing is considered an integral part of a complete examination. Limited examinations for reoccurring indications may be performed as noted.  Right Carotid Findings: +----------+--------+--------+--------+-------------------------+--------+           PSV cm/sEDV cm/sStenosisPlaque Description       Comments +----------+--------+--------+--------+-------------------------+--------+ CCA Prox  89      12                                                 +----------+--------+--------+--------+-------------------------+--------+ CCA Distal74      16              hyperechoic and smooth            +----------+--------+--------+--------+-------------------------+--------+ ICA Prox  77      16              heterogenous and calcific         +----------+--------+--------+--------+-------------------------+--------+ ICA Mid   68      18                                                +----------+--------+--------+--------+-------------------------+--------+ ICA Distal93      18                                                +----------+--------+--------+--------+-------------------------+--------+  ECA       106     13                                                +----------+--------+--------+--------+-------------------------+--------+ +----------+--------+-------+----------------+-------------------+           PSV cm/sEDV cmsDescribe        Arm Pressure (mmHG) +----------+--------+-------+----------------+-------------------+ Subclavian171            Multiphasic, WNL                    +----------+--------+-------+----------------+-------------------+ +---------+--------+--+--------+--+---------+ VertebralPSV cm/s56EDV cm/s13Antegrade +---------+--------+--+--------+--+---------+  Left Carotid Findings: +----------+--------+--------+--------+-------------------------+--------+           PSV cm/sEDV cm/sStenosisPlaque Description       Comments +----------+--------+--------+--------+-------------------------+--------+ CCA Prox  101     14                                                +----------+--------+--------+--------+-------------------------+--------+ CCA Distal107     17              heterogenous                      +----------+--------+--------+--------+-------------------------+--------+ ICA Prox  81      17              heterogenous and calcific          +----------+--------+--------+--------+-------------------------+--------+ ICA Mid   94      25                                                +----------+--------+--------+--------+-------------------------+--------+ ICA Distal73      17                                                +----------+--------+--------+--------+-------------------------+--------+ ECA       124     15                                                +----------+--------+--------+--------+-------------------------+--------+ +----------+--------+--------+----------------+-------------------+           PSV cm/sEDV cm/sDescribe        Arm Pressure (mmHG) +----------+--------+--------+----------------+-------------------+ Subclavian160             Multiphasic, WNL                    +----------+--------+--------+----------------+-------------------+ +---------+--------+--+--------+-+---------+ VertebralPSV cm/s29EDV cm/s9Antegrade +---------+--------+--+--------+-+---------+   Summary: Right Carotid: Velocities in the right ICA are consistent with a 1-39% stenosis. Left Carotid: Velocities in the left ICA are consistent with a 1-39% stenosis. Vertebrals:  Bilateral vertebral arteries demonstrate antegrade flow. Subclavians: Normal flow hemodynamics were seen in bilateral subclavian              arteries. *See table(s) above for measurements and observations.  Electronically signed by  Debby Robertson on 07/02/2024 at 4:08:32 PM.    Final    ECHOCARDIOGRAM COMPLETE Result Date: 06/29/2024    ECHOCARDIOGRAM REPORT   Patient Name:   RAYCHELLE HUDMAN Date of Exam: 06/29/2024 Medical Rec #:  [id number]       Height:       63.0 in Accession #:    [id number]      Weight:       150.0 lb Date of Birth:  [birth year]       BSA:          1.711 m Patient Age:    78 years        BP:           110/48 mmHg Patient Gender: F               HR:           56 bpm. Exam Location:  Inpatient Procedure: 2D Echo, Cardiac Doppler and  Color Doppler (Both Spectral and Color            Flow Doppler were utilized during procedure). Indications:    Syncope R55  History:        Patient has prior history of Echocardiogram examinations, most                 recent 12/19/2023. Stroke and COPD, Arrythmias:Atrial                 Fibrillation and Atrial Flutter, Signs/Symptoms:Chest Pain and                 Syncope; Risk Factors:Hypertension, Diabetes and Dyslipidemia.                 Aortic Valve: 21 mm Edwards valve is present in the aortic                 position. Procedure Date: 07/18/2013.                 Mitral Valve: 25 mm Edwards bioprosthetic valve valve is present                 in the mitral position. Procedure Date: 07/18/2013.  Sonographer:    Thea Norlander RCS Referring Phys: MIGNON ONEIDA BUMP IMPRESSIONS  1. S/P MVR with mean gradient 8 mmHg and no MR; s/p AVR with mean gradient 13 mmHg and no AI.  2. Left ventricular ejection fraction, by estimation, is 70 to 75%. The left ventricle has hyperdynamic function. The left ventricle has no regional wall motion abnormalities. Left ventricular diastolic parameters are indeterminate. Elevated left atrial  pressure.  3. Right ventricular systolic function is moderately reduced. The right ventricular size is mildly enlarged.  4. Left atrial size was severely dilated.  5. Right atrial size was mildly dilated.  6. The mitral valve has been repaired/replaced. No evidence of mitral valve regurgitation. Mild mitral stenosis. There is a 25 mm Edwards bioprosthetic valve present in the mitral position. Procedure Date: 07/18/2013.  7. The aortic valve has been repaired/replaced. Aortic valve regurgitation is not visualized. No aortic stenosis is present. There is a 21 mm Edwards valve present in the aortic position. Procedure Date: 07/18/2013.  8. The inferior vena cava is normal in size with greater than 50% respiratory variability, suggesting right atrial pressure of 3 mmHg. FINDINGS  Left Ventricle: Left  ventricular ejection fraction, by estimation, is 70 to 75%. The left ventricle has hyperdynamic function. The left ventricle has no regional  wall motion abnormalities. The left ventricular internal cavity size was normal in size. There is no left ventricular hypertrophy. Left ventricular diastolic parameters are indeterminate. Elevated left atrial pressure. Right Ventricle: The right ventricular size is mildly enlarged. Right ventricular systolic function is moderately reduced. Left Atrium: Left atrial size was severely dilated. Right Atrium: Right atrial size was mildly dilated. Pericardium: Trivial pericardial effusion is present. Mitral Valve: The mitral valve has been repaired/replaced. No evidence of mitral valve regurgitation. There is a 25 mm Edwards bioprosthetic valve present in the mitral position. Procedure Date: 07/18/2013. Mild mitral valve stenosis. MV peak gradient, 25.9 mmHg. The mean mitral valve gradient is 8.0 mmHg. Tricuspid Valve: The tricuspid valve is normal in structure. Tricuspid valve regurgitation is trivial. No evidence of tricuspid stenosis. Aortic Valve: The aortic valve has been repaired/replaced. Aortic valve regurgitation is not visualized. No aortic stenosis is present. Aortic valve mean gradient measures 12.5 mmHg. Aortic valve peak gradient measures 24.4 mmHg. Aortic valve area, by VTI measures 1.55 cm. There is a 21 mm Edwards valve present in the aortic position. Procedure Date: 07/18/2013. Pulmonic Valve: The pulmonic valve was normal in structure. Pulmonic valve regurgitation is trivial. No evidence of pulmonic stenosis. Aorta: The aortic root is normal in size and structure. Venous: The inferior vena cava is normal in size with greater than 50% respiratory variability, suggesting right atrial pressure of 3 mmHg. IAS/Shunts: No atrial level shunt detected by color flow Doppler. Additional Comments: S/P MVR with mean gradient 8 mmHg and no MR; s/p AVR with mean gradient 13  mmHg and no AI.  LEFT VENTRICLE PLAX 2D LVIDd:         3.80 cm   Diastology LVIDs:         2.40 cm   LV e' medial:   6.42 cm/s LV PW:         1.10 cm   LV E/e' medial: 29.8 LV IVS:        0.70 cm LVOT diam:     2.10 cm LV SV:         88 LV SV Index:   52 LVOT Area:     3.46 cm  RIGHT VENTRICLE            IVC RV S prime:     9.73 cm/s  IVC diam: 2.20 cm TAPSE (M-mode): 1.0 cm LEFT ATRIUM             Index        RIGHT ATRIUM           Index LA diam:        3.30 cm 1.93 cm/m   RA Area:     18.20 cm LA Vol (A2C):   59.6 ml 34.86 ml/m  RA Volume:   49.10 ml  28.69 ml/m LA Vol (A4C):   85.6 ml 50.03 ml/m LA Biplane Vol: 75.7 ml 44.24 ml/m  AORTIC VALVE AV Area (Vmax):    1.65 cm AV Area (Vmean):   1.67 cm AV Area (VTI):     1.55 cm AV Vmax:           247.00 cm/s AV Vmean:          160.500 cm/s AV VTI:            0.568 m AV Peak Grad:      24.4 mmHg AV Mean Grad:      12.5 mmHg LVOT Vmax:         118.00  cm/s LVOT Vmean:        77.400 cm/s LVOT VTI:          0.255 m LVOT/AV VTI ratio: 0.45  AORTA Ao Root diam: 2.70 cm Ao Asc diam:  3.10 cm MITRAL VALVE MV Area (PHT): 1.48 cm     SHUNTS MV Area VTI:   1.02 cm     Systemic VTI:  0.26 m MV Peak grad:  25.9 mmHg    Systemic Diam: 2.10 cm MV Mean grad:  8.0 mmHg MV Vmax:       2.55 m/s MV Vmean:      128.3 cm/s MV Decel Time: 511 msec MV E velocity: 191.00 cm/s MV A velocity: 84.80 cm/s MV E/A ratio:  2.25 Redell Shallow MD Electronically signed by Redell Shallow MD Signature Date/Time: 06/29/2024/12:53:19 PM    Final    MR Cervical Spine Wo Contrast Result Date: 06/28/2024 CLINICAL DATA:  Initial evaluation for acute ataxia. EXAM: MRI CERVICAL SPINE WITHOUT CONTRAST TECHNIQUE: Multiplanar, multisequence MR imaging of the cervical spine was performed. No intravenous contrast was administered. COMPARISON:  CT from earlier the same day. FINDINGS: Alignment: Straightening with slight reversal of the normal cervical lordosis. No significant listhesis. Vertebrae: Mild  chronic height loss with endplate Schmorl's node deformity noted at the superior endplate of T2. Vertebral body height otherwise maintained without acute or recent fracture. Bone marrow signal intensity within normal limits. No worrisome osseous lesions. No abnormal marrow edema. Cord: Normal signal and morphology. Posterior Fossa, vertebral arteries, paraspinal tissues: Patchy signal abnormality within the pons, most characteristic of chronic micro ischemic disease. Few small remote left cerebellar infarcts. Craniocervical junction normal. Paraspinous soft tissues within normal limits. Normal flow voids seen within the vertebral arteries bilaterally. Disc levels: C2-C3: Negative interspace. Mild to moderate left greater than right facet hypertrophy. No stenosis. C3-C4: Central disc protrusion indents the ventral thecal sac. Moderate right with mild left facet hypertrophy. No spinal stenosis. Foramina remain patent. C4-C5: Left paracentral disc protrusion indents the ventral thecal sac. Minimal cord flattening without cord signal changes. Mild spinal stenosis. Foramina remain patent. C5-C6: Degenerative disc space narrowing with diffuse disc osteophyte complex. Flattening and partial effacement of the ventral thecal sac, asymmetric to the right. Mild spinal stenosis. Severe left with moderate right C6 foraminal narrowing. C6-C7: Degenerative intervertebral disc space narrowing with diffuse disc osteophyte complex. Flattening of the ventral thecal sac with no more than mild spinal stenosis. Moderate left with mild to moderate right C7 foraminal narrowing. C7-T1: Negative interspace. Mild left greater than right facet hypertrophy. No stenosis. IMPRESSION: 1. Normal MRI appearance of the cervical spinal cord. 2. Multilevel cervical spondylosis with resultant mild diffuse spinal stenosis at C4-5 through C6-7. 3. Multifactorial degenerative changes with resultant multilevel foraminal narrowing as above. Notable findings  include severe left with moderate right C6 foraminal stenosis, with moderate left and mild to moderate right C7 foraminal narrowing. Electronically Signed   By: Morene Hoard M.D.   On: 06/28/2024 21:23   MR BRAIN WO CONTRAST Result Date: 06/28/2024 CLINICAL DATA:  Initial evaluation for acute neuro deficit, stroke suspected. EXAM: MRI HEAD WITHOUT CONTRAST TECHNIQUE: Multiplanar, multiecho pulse sequences of the brain and surrounding structures were obtained without intravenous contrast. COMPARISON:  CT from earlier the same day. FINDINGS: Brain: Examination degraded by motion. Generalized age-related cerebral atrophy. Patchy T2/FLAIR hyperintensity about the periventricular and deep white matter as well as the pons, consistent with chronic small vessel ischemic disease, moderate in nature. Encephalomalacia and gliosis involving  the subcortical right frontal lobe, consistent with a chronic right ACA distribution infarct. Multiple small remote bilateral cerebellar infarcts, greater on the left. Small remote lacunar infarct noted within the left thalamus. No evidence for acute or subacute ischemia. Gray-white matter differentiation maintained. 1.2 cm acute intraparenchymal hemorrhage present at the subcortical left frontal lobe, corresponding with abnormality on prior CT, likely a small hemorrhagic contusion. Trace acute intraventricular hemorrhage seen layering within the occipital horns of both lateral ventricles. No other acute intracranial hemorrhage. Chronic hemosiderin staining noted at the parafalcine right frontal lobe related to the adjacent right ACA infarct. No other significant chronic intracranial blood products. No mass lesion or midline shift. Ventricular prominence related global parenchymal volume loss of hydrocephalus. No extra-axial fluid collection. Pituitary gland within normal limits. Vascular: Major intracranial vascular flow voids are grossly maintained at the skull base. Skull and  upper cervical spine: Cranial junction within normal limits. Bone marrow signal intensity normal. Soft tissue contusion present at the left forehead/periorbital region. Sinuses/Orbits: Acute left orbital floor fracture with scattered blood products within the left maxillary sinus, better evaluated on prior maxillofacial CT. No mastoid effusion. Other: None. IMPRESSION: 1. 1.2 cm acute intraparenchymal hemorrhage at the subcortical left frontal lobe, corresponding with abnormality on prior CT, likely a small hemorrhagic contusion. 2. Trace acute intraventricular hemorrhage layering within the occipital horns of both lateral ventricles. No hydrocephalus. 3. No other acute intracranial abnormality. 4. Acute left orbital floor fracture, better evaluated on prior maxillofacial CT. Prominent overlying left periorbital soft tissue contusion. 5. Underlying age-related cerebral atrophy with moderate chronic small vessel ischemic disease, with multiple remote ischemic infarcts as above. Electronically Signed   By: Morene Hoard M.D.   On: 06/28/2024 21:03   CT HEAD WO CONTRAST Result Date: 06/28/2024 CLINICAL DATA:  Syncopal event with head injury and loss of consciousness. Patient on Coumadin . Facial trauma. Nausea and vomiting. EXAM: CT HEAD WITHOUT CONTRAST CT MAXILLOFACIAL WITHOUT CONTRAST CT CERVICAL SPINE WITHOUT CONTRAST TECHNIQUE: Multidetector CT imaging of the head, cervical spine, and maxillofacial structures were performed using the standard protocol without intravenous contrast. Multiplanar CT image reconstructions of the cervical spine and maxillofacial structures were also generated. RADIATION DOSE REDUCTION: This exam was performed according to the departmental dose-optimization program which includes automated exposure control, adjustment of the mA and/or kV according to patient size and/or use of iterative reconstruction technique. COMPARISON:  01/19/2024 FINDINGS: CT HEAD FINDINGS Brain:  Ventricles, cisterns and other CSF spaces are normal. There is chronic ischemic microvascular disease. Stable mild encephalomalacia over the right frontal lobe. 7 mm focus of intraparenchymal acute hemorrhage over the left frontal white matter. Very minimal acute subarachnoid hemorrhage over the adjacent right frontal lobe. No epidural subdural hemorrhage. No significant mass effect or midline shift. Traumatic Brain Injury Risk Stratification Skull Fracture: No - Low/mBIG 1 Subdural Hematoma (SDH): No - Low Subarachnoid Hemorrhage [facility]): Small single focus of acute subarachnoid hemorrhage over the right frontal region adjacent the midline. Epidural Hematoma (EDH): No - Low/mBIG 1 Cerebral contusion, intra-axial, intraparenchymal Hemorrhage (IPH): Single 7 mm focus of acute intraparenchymal hemorrhage over the left frontal white matter. Intraventricular Hemorrhage (IVH): No - Low/mBIG 1 Midline Shift > 1mm or Edema/effacement of sulci/vents: No - Low/mBIG 1 ---------------------------------------------------- Vascular: No hyperdense vessel or unexpected calcification. Skull: Normal. Negative for fracture or focal lesion. Other: Minimally displaced nasal bone fracture as well as fracture of the junction of the left nasal bone to the frontal process of the maxilla. Opacification over the left maxillary  and ethmoid air cells. Minimal opacification over the left sphenoid sinus. Left frontal scalp contusion. CT MAXILLOFACIAL FINDINGS Osseous: Examination demonstrates a depressed comminuted left orbital floor fracture with partial entrapment of inferior rectus. There is also fracture of the medial wall the left orbit without entrapment of intraorbital contents. There is a minimally displaced nasal bone fracture. Subtle fracture of the junction of the left nasal bone to the frontal process of the left axilla. Orbits: Fractures of the medial wall and floor of the left orbit as described above. Left periorbital soft tissue  swelling. Globes are normal and symmetric. Retrobulbar spaces are otherwise unremarkable. Sinuses: Hypoplastic frontal sinuses. Hemorrhagic debris over the and left ethmoid and maxillary sinuses. Mastoid air cells are clear. Soft tissues: Soft tissue swelling adjacent the left mandible as well as over the left nasal bridge and left periorbital region. CT CERVICAL SPINE FINDINGS Alignment: Normal. Skull base and vertebrae: Vertebral body heights are maintained. There is mild spondylosis throughout the cervical spine to include uncovertebral joint spurring and facet arthropathy. No acute fracture. Mild bilateral neural foraminal narrowing at the C5-6 level and C6-7 levels. Soft tissues and spinal canal: No prevertebral fluid or swelling. No visible canal hematoma. Disc levels: Mild disc space narrowing at the C5-6 and C6-7 levels. Central disc protrusion at the C3-4 level with more pronounced central disc protrusion at the C4-5 level. Upper chest: No acute findings. Other: Schmorl's node over the superior endplate of T2 unchanged. IMPRESSION: 1. 7 mm focus of acute intraparenchymal hemorrhage over the left frontal white matter. Very minimal acute subarachnoid hemorrhage over the adjacent right frontal lobe. Findings compatible with minor TBI BIG-2 categorization. 2. Chronic ischemic microvascular disease with stable mild encephalomalacia over the right frontal lobe. 3. Depressed comminuted left orbital floor fracture with partial entrapment of inferior rectus muscle. Fracture of the medial wall the left orbit without entrapment of intraorbital contents. 4. Minimally displaced nasal bone fracture as well as fracture of the junction of the left nasal bone to the frontal process of the left maxilla. 5. No acute cervical spine injury. 6. Mild spondylosis throughout the cervical spine with mild disc disease at the C5-6 and C6-7 levels. Central disc protrusion at the C3-4 level with more pronounced central disc protrusion  at the C4-5 level as these findings would be better evaluated with MRI. Critical Value/emergent results were called by telephone at the time of interpretation on 06/28/2024 at 1:28 pm to provider ANDREW TEE , who verbally acknowledged these results. Electronically Signed   By: Toribio Agreste M.D.   On: 06/28/2024 13:29   CT MAXILLOFACIAL WO CONTRAST Result Date: 06/28/2024 CLINICAL DATA:  Syncopal event with head injury and loss of consciousness. Patient on Coumadin . Facial trauma. Nausea and vomiting. EXAM: CT HEAD WITHOUT CONTRAST CT MAXILLOFACIAL WITHOUT CONTRAST CT CERVICAL SPINE WITHOUT CONTRAST TECHNIQUE: Multidetector CT imaging of the head, cervical spine, and maxillofacial structures were performed using the standard protocol without intravenous contrast. Multiplanar CT image reconstructions of the cervical spine and maxillofacial structures were also generated. RADIATION DOSE REDUCTION: This exam was performed according to the departmental dose-optimization program which includes automated exposure control, adjustment of the mA and/or kV according to patient size and/or use of iterative reconstruction technique. COMPARISON:  01/19/2024 FINDINGS: CT HEAD FINDINGS Brain: Ventricles, cisterns and other CSF spaces are normal. There is chronic ischemic microvascular disease. Stable mild encephalomalacia over the right frontal lobe. 7 mm focus of intraparenchymal acute hemorrhage over the left frontal white matter. Very minimal acute  subarachnoid hemorrhage over the adjacent right frontal lobe. No epidural subdural hemorrhage. No significant mass effect or midline shift. Traumatic Brain Injury Risk Stratification Skull Fracture: No - Low/mBIG 1 Subdural Hematoma (SDH): No - Low Subarachnoid Hemorrhage [facility]): Small single focus of acute subarachnoid hemorrhage over the right frontal region adjacent the midline. Epidural Hematoma (EDH): No - Low/mBIG 1 Cerebral contusion, intra-axial, intraparenchymal Hemorrhage  (IPH): Single 7 mm focus of acute intraparenchymal hemorrhage over the left frontal white matter. Intraventricular Hemorrhage (IVH): No - Low/mBIG 1 Midline Shift > 1mm or Edema/effacement of sulci/vents: No - Low/mBIG 1 ---------------------------------------------------- Vascular: No hyperdense vessel or unexpected calcification. Skull: Normal. Negative for fracture or focal lesion. Other: Minimally displaced nasal bone fracture as well as fracture of the junction of the left nasal bone to the frontal process of the maxilla. Opacification over the left maxillary and ethmoid air cells. Minimal opacification over the left sphenoid sinus. Left frontal scalp contusion. CT MAXILLOFACIAL FINDINGS Osseous: Examination demonstrates a depressed comminuted left orbital floor fracture with partial entrapment of inferior rectus. There is also fracture of the medial wall the left orbit without entrapment of intraorbital contents. There is a minimally displaced nasal bone fracture. Subtle fracture of the junction of the left nasal bone to the frontal process of the left axilla. Orbits: Fractures of the medial wall and floor of the left orbit as described above. Left periorbital soft tissue swelling. Globes are normal and symmetric. Retrobulbar spaces are otherwise unremarkable. Sinuses: Hypoplastic frontal sinuses. Hemorrhagic debris over the and left ethmoid and maxillary sinuses. Mastoid air cells are clear. Soft tissues: Soft tissue swelling adjacent the left mandible as well as over the left nasal bridge and left periorbital region. CT CERVICAL SPINE FINDINGS Alignment: Normal. Skull base and vertebrae: Vertebral body heights are maintained. There is mild spondylosis throughout the cervical spine to include uncovertebral joint spurring and facet arthropathy. No acute fracture. Mild bilateral neural foraminal narrowing at the C5-6 level and C6-7 levels. Soft tissues and spinal canal: No prevertebral fluid or swelling. No  visible canal hematoma. Disc levels: Mild disc space narrowing at the C5-6 and C6-7 levels. Central disc protrusion at the C3-4 level with more pronounced central disc protrusion at the C4-5 level. Upper chest: No acute findings. Other: Schmorl's node over the superior endplate of T2 unchanged. IMPRESSION: 1. 7 mm focus of acute intraparenchymal hemorrhage over the left frontal white matter. Very minimal acute subarachnoid hemorrhage over the adjacent right frontal lobe. Findings compatible with minor TBI BIG-2 categorization. 2. Chronic ischemic microvascular disease with stable mild encephalomalacia over the right frontal lobe. 3. Depressed comminuted left orbital floor fracture with partial entrapment of inferior rectus muscle. Fracture of the medial wall the left orbit without entrapment of intraorbital contents. 4. Minimally displaced nasal bone fracture as well as fracture of the junction of the left nasal bone to the frontal process of the left maxilla. 5. No acute cervical spine injury. 6. Mild spondylosis throughout the cervical spine with mild disc disease at the C5-6 and C6-7 levels. Central disc protrusion at the C3-4 level with more pronounced central disc protrusion at the C4-5 level as these findings would be better evaluated with MRI. Critical Value/emergent results were called by telephone at the time of interpretation on 06/28/2024 at 1:28 pm to provider ANDREW TEE , who verbally acknowledged these results. Electronically Signed   By: Toribio Agreste M.D.   On: 06/28/2024 13:29   CT CERVICAL SPINE WO CONTRAST Result Date: 06/28/2024 CLINICAL DATA:  Syncopal event with head injury and loss of consciousness. Patient on Coumadin . Facial trauma. Nausea and vomiting. EXAM: CT HEAD WITHOUT CONTRAST CT MAXILLOFACIAL WITHOUT CONTRAST CT CERVICAL SPINE WITHOUT CONTRAST TECHNIQUE: Multidetector CT imaging of the head, cervical spine, and maxillofacial structures were performed using the standard protocol  without intravenous contrast. Multiplanar CT image reconstructions of the cervical spine and maxillofacial structures were also generated. RADIATION DOSE REDUCTION: This exam was performed according to the departmental dose-optimization program which includes automated exposure control, adjustment of the mA and/or kV according to patient size and/or use of iterative reconstruction technique. COMPARISON:  01/19/2024 FINDINGS: CT HEAD FINDINGS Brain: Ventricles, cisterns and other CSF spaces are normal. There is chronic ischemic microvascular disease. Stable mild encephalomalacia over the right frontal lobe. 7 mm focus of intraparenchymal acute hemorrhage over the left frontal white matter. Very minimal acute subarachnoid hemorrhage over the adjacent right frontal lobe. No epidural subdural hemorrhage. No significant mass effect or midline shift. Traumatic Brain Injury Risk Stratification Skull Fracture: No - Low/mBIG 1 Subdural Hematoma (SDH): No - Low Subarachnoid Hemorrhage [facility]): Small single focus of acute subarachnoid hemorrhage over the right frontal region adjacent the midline. Epidural Hematoma (EDH): No - Low/mBIG 1 Cerebral contusion, intra-axial, intraparenchymal Hemorrhage (IPH): Single 7 mm focus of acute intraparenchymal hemorrhage over the left frontal white matter. Intraventricular Hemorrhage (IVH): No - Low/mBIG 1 Midline Shift > 1mm or Edema/effacement of sulci/vents: No - Low/mBIG 1 ---------------------------------------------------- Vascular: No hyperdense vessel or unexpected calcification. Skull: Normal. Negative for fracture or focal lesion. Other: Minimally displaced nasal bone fracture as well as fracture of the junction of the left nasal bone to the frontal process of the maxilla. Opacification over the left maxillary and ethmoid air cells. Minimal opacification over the left sphenoid sinus. Left frontal scalp contusion. CT MAXILLOFACIAL FINDINGS Osseous: Examination demonstrates a  depressed comminuted left orbital floor fracture with partial entrapment of inferior rectus. There is also fracture of the medial wall the left orbit without entrapment of intraorbital contents. There is a minimally displaced nasal bone fracture. Subtle fracture of the junction of the left nasal bone to the frontal process of the left axilla. Orbits: Fractures of the medial wall and floor of the left orbit as described above. Left periorbital soft tissue swelling. Globes are normal and symmetric. Retrobulbar spaces are otherwise unremarkable. Sinuses: Hypoplastic frontal sinuses. Hemorrhagic debris over the and left ethmoid and maxillary sinuses. Mastoid air cells are clear. Soft tissues: Soft tissue swelling adjacent the left mandible as well as over the left nasal bridge and left periorbital region. CT CERVICAL SPINE FINDINGS Alignment: Normal. Skull base and vertebrae: Vertebral body heights are maintained. There is mild spondylosis throughout the cervical spine to include uncovertebral joint spurring and facet arthropathy. No acute fracture. Mild bilateral neural foraminal narrowing at the C5-6 level and C6-7 levels. Soft tissues and spinal canal: No prevertebral fluid or swelling. No visible canal hematoma. Disc levels: Mild disc space narrowing at the C5-6 and C6-7 levels. Central disc protrusion at the C3-4 level with more pronounced central disc protrusion at the C4-5 level. Upper chest: No acute findings. Other: Schmorl's node over the superior endplate of T2 unchanged. IMPRESSION: 1. 7 mm focus of acute intraparenchymal hemorrhage over the left frontal white matter. Very minimal acute subarachnoid hemorrhage over the adjacent right frontal lobe. Findings compatible with minor TBI BIG-2 categorization. 2. Chronic ischemic microvascular disease with stable mild encephalomalacia over the right frontal lobe. 3. Depressed comminuted left orbital floor fracture with partial  entrapment of inferior rectus muscle.  Fracture of the medial wall the left orbit without entrapment of intraorbital contents. 4. Minimally displaced nasal bone fracture as well as fracture of the junction of the left nasal bone to the frontal process of the left maxilla. 5. No acute cervical spine injury. 6. Mild spondylosis throughout the cervical spine with mild disc disease at the C5-6 and C6-7 levels. Central disc protrusion at the C3-4 level with more pronounced central disc protrusion at the C4-5 level as these findings would be better evaluated with MRI. Critical Value/emergent results were called by telephone at the time of interpretation on 06/28/2024 at 1:28 pm to provider ANDREW TEE , who verbally acknowledged these results. Electronically Signed   By: Toribio Agreste M.D.   On: 06/28/2024 13:29   DG Chest Port 1 View Result Date: 06/28/2024 EXAM: 1 VIEW(S) XRAY OF THE CHEST 06/28/2024 12:31:00 PM COMPARISON: 05/30/2024 CLINICAL HISTORY: trauma; fall on thinners FINDINGS: LUNGS AND PLEURA: Diffuse interstitial prominence. Bibasilar patchy opacities. No pulmonary edema. No pleural effusion. No pneumothorax. HEART AND MEDIASTINUM: Similar enlarged cardiomediastinal silhouette status post aortic and mitral valve prostheses. BONES AND SOFT TISSUES: Right axillary surgical clips. Nondisplaced median sternotomy wires. No acute osseous abnormality. IMPRESSION: 1. Diffuse interstitial prominence with bibasilar patchy opacities, which may reflect edema or infection; correlate clinically. 2. Stable enlarged cardiomediastinal silhouette with aortic and mitral valve prostheses. Electronically signed by: Waddell Calk MD 06/28/2024 01:06 PM EDT RP Workstation: [id number]     Subjective: - no chest pain, shortness of breath, no abdominal pain, nausea or vomiting.   Discharge Exam: BP (!) 147/91 (BP Location: Left Arm)   Pulse 84   Temp 98.6 F (37 C) (Oral)   Resp 16   Ht 5' 3 (1.6 m)   Wt 68 kg   SpO2 93%   BMI 26.57 kg/m   General: Pt  is alert, awake, not in acute distress Cardiovascular: RRR, S1/S2 +, no rubs, no gallops Respiratory: CTA bilaterally, no wheezing, no rhonchi Abdominal: Soft, NT, ND, bowel sounds + Extremities: no edema, no cyanosis    The results of significant diagnostics from this hospitalization (including imaging, microbiology, ancillary and laboratory) are listed below for reference.     Microbiology: No results found for this or any previous visit (from the past 240 hours).   Labs: Basic Metabolic Panel: Recent Labs  Lab 07/05/24 0457 07/06/24 0856 07/07/24 0411 07/08/24 0648 07/10/24 0342  NA 140 138 138 139 136  K 3.3* 4.1 3.7 3.4* 3.4*  CL 99 97* 98 98 103  CO2 31 31 29 29 23   GLUCOSE 111* 143* 104* 102* 125*  BUN 15 16 20 15 11   CREATININE 1.32* 1.43* 1.29* 1.33* 1.28*  CALCIUM  8.7* 9.0 8.6* 8.6* 8.5*  MG 2.1 2.3 2.1 2.0 1.8   Liver Function Tests: Recent Labs  Lab 07/10/24 0342  AST 32  ALT 26  ALKPHOS 62  BILITOT 1.1  PROT 6.0*  ALBUMIN  2.9*   CBC: Recent Labs  Lab 07/05/24 0457 07/06/24 0856 07/07/24 0411 07/08/24 0648 07/10/24 0342  WBC 5.4 5.3 5.7 5.7 6.9  NEUTROABS  --   --   --   --  4.9  HGB 10.3* 10.9* 9.8* 10.4* 10.4*  HCT 34.3* 36.7 32.5* 34.4* 34.2*  MCV 88.6 88.9 88.6 88.4 87.9  PLT 130* 147* 129* 145* 149*   CBG: Recent Labs  Lab 07/09/24 1143 07/09/24 1725 07/09/24 2149 07/10/24 0810 07/10/24 1209  GLUCAP 114* 187* 112* 217*  121*   Hgb A1c No results for input(s): HGBA1C in the last 72 hours. Lipid Profile No results for input(s): CHOL, HDL, LDLCALC, TRIG, CHOLHDL, LDLDIRECT in the last 72 hours. Thyroid  function studies No results for input(s): TSH, T4TOTAL, T3FREE, THYROIDAB in the last 72 hours.  Invalid input(s): FREET3 Urinalysis    Component Value Date/Time   COLORURINE STRAW (A) 07/02/2024 1643   APPEARANCEUR CLEAR 07/02/2024 1643   LABSPEC 1.006 07/02/2024 1643   PHURINE 7.0 07/02/2024 1643    GLUCOSEU >=500 (A) 07/02/2024 1643   HGBUR NEGATIVE 07/02/2024 1643   BILIRUBINUR NEGATIVE 07/02/2024 1643   KETONESUR NEGATIVE 07/02/2024 1643   PROTEINUR NEGATIVE 07/02/2024 1643   UROBILINOGEN 0.2 07/07/2014 1935   NITRITE NEGATIVE 07/02/2024 1643   LEUKOCYTESUR NEGATIVE 07/02/2024 1643    FURTHER DISCHARGE INSTRUCTIONS:   Get Medicines reviewed and adjusted: Please take all your medications with you for your next visit with your Primary MD   Laboratory/radiological data: Please request your Primary MD to go over all hospital tests and procedure/radiological results at the follow up, please ask your Primary MD to get all Hospital records sent to his/her office.   In some cases, they will be blood work, cultures and biopsy results pending at the time of your discharge. Please request that your primary care M.D. goes through all the records of your hospital data and follows up on these results.   Also Note the following: If you experience worsening of your admission symptoms, develop shortness of breath, life threatening emergency, suicidal or homicidal thoughts you must seek medical attention immediately by calling 911 or calling your MD immediately  if symptoms less severe.   You must read complete instructions/literature along with all the possible adverse reactions/side effects for all the Medicines you take and that have been prescribed to you. Take any new Medicines after you have completely understood and accpet all the possible adverse reactions/side effects.    Do not drive when taking Pain medications or sleeping medications (Benzodaizepines)   Do not take more than prescribed Pain, Sleep and Anxiety Medications. It is not advisable to combine anxiety,sleep and pain medications without talking with your primary care practitioner   Special Instructions: If you have smoked or chewed Tobacco  in the last 2 yrs please stop smoking, stop any regular Alcohol  and or any  Recreational drug use.   Wear Seat belts while driving.   Please note: You were cared for by a hospitalist during your hospital stay. Once you are discharged, your primary care physician will handle any further medical issues. Please note that NO REFILLS for any discharge medications will be authorized once you are discharged, as it is imperative that you return to your primary care physician (or establish a relationship with a primary care physician if you do not have one) for your post hospital discharge needs so that they can reassess your need for medications and monitor your lab values.  Time coordinating discharge: 40 minutes  SIGNED:  Nilda Fendt, MD, PhD 07/10/2024, 1:35 PM

## 2024-07-10 NOTE — Progress Notes (Signed)
 AVS and discharge education reviewed with pt and pt's husband, [name]. All questions answered. TOC medications and AVS handed to pt's husband. PIV removed with no complications. Tele removed and CCMD called. Per case manager, RW to be delivered to pt's home. Pt transported off unit via wheelchair with all personal belongings with NT. Pt's husband to transport pt home.

## 2024-07-10 NOTE — TOC Transition Note (Addendum)
 Transition of Care (TOC) - Discharge Note Rayfield Gobble RN,BSN Inpatient Care Management Unit 4NP (Non Trauma)- RN Case Manager See Treatment Team for direct Phone #   Patient Details  Name: [name] MRN: [id number] Date of Birth: 12/21/1945  Transition of Care [facility]) CM/SW Contact:  Gobble Rayfield Hurst, RN Phone Number: 07/10/2024, 2:34 PM   Clinical Narrative:    Notified by INPT rehab liaison that pt has been denied INPT rehab on appeal with insurance. Spouse has declined SNF and wants to take pt home w/ HH.   MD has placed HH orders, spouse not at bedside- TC made to discuss [facility] and DME needs. Per spouse he was under the understanding that only one [facility] agency serviced [place]- explained that there were several and Medicare.gov list reviewed with spouse over phone with star ratings- list also left at bedside for reference. Spouse voiced that he would like to see if [facility] could service with [facility] as backup.  Spouse on his way to hospital to provide transport home.   Discussed DME needs- spouse requesting RW for home, states pt has rollator that was purchased privately but it's too heavy- MD to place DME-RW order. - spouse agreeable to have RW delivered to home if unable to have delivered to room by the time he arrives- states he is about 40 min away.   Call made to [employer] liaison for DME- RW- referral accepted and liaison will place expedited request on order for delivery.  1540- spouse here to transport- RW has not arrived to room per bedside RN- [employer] liaison updated and will have RW shipped to home.   Call made to [facility] liaison- they do not have all needed services in pt's home service area- only RN/PT.  Referral sent to [facility] via Hub- referral has been accepted for all needed services.   IP CM interventions have been completed no further needs noted.   Final next level of care: Home w Home Health Services Barriers to Discharge: Barriers  Resolved   Patient Goals and CMS Choice Patient states their goals for this hospitalization and ongoing recovery are:: return home and recover CMS Medicare.gov Compare Post Acute Care list provided to:: Patient Represenative (must comment) Choice offered to / list presented to : Spouse      Discharge Placement                 Home  w/ [facility]      Discharge Plan and Services Additional resources added to the After Visit Summary for   In-house Referral: Clinical Social Work Discharge Planning Services: CM Consult Post Acute Care Choice: Home Health, Durable Medical Equipment          DME Arranged: Walker rolling DME Agency: [employer] Date DME Agency Contacted: 07/10/24 Time DME Agency Contacted: [phone] Representative spoke with at DME Agency: [facility] HH Arranged: RN, PT, OT, Social Work   Date [employer] Agency Contacted: 07/10/24 Time HH Agency Contacted: 1417    Social Drivers of Health (SDOH) Interventions SDOH Screenings   Food Insecurity: Patient Declined (06/28/2024)  Housing: Unknown (06/28/2024)  Transportation Needs: Patient Declined (06/28/2024)  Utilities: Patient Declined (06/28/2024)  Social Connections: Patient Declined (06/28/2024)  Tobacco Use: Low Risk  (06/28/2024)     Readmission Risk Interventions    07/10/2024    2:18 PM 11/14/2022   11:52 AM  Readmission Risk Prevention Plan  Transportation Screening Complete Complete  PCP or Specialist Appt within 3-5 Days  Complete  HRI or Home Care  Consult  Complete  Social Work Consult for Recovery Care Planning/Counseling  --  Palliative Care Screening  Not Applicable  Medication Review Oceanographer) Complete Complete  PCP or Specialist appointment within 3-5 days of discharge Complete   HRI or Home Care Consult Complete   SW Recovery Care/Counseling Consult Complete   Palliative Care Screening Not Applicable   Skilled Nursing Facility Patient Refused

## 2024-07-10 NOTE — Progress Notes (Addendum)
 Inpatient Rehab Admissions Coordinator:   Continue to await [facility] Medicare review for expedited appeal.   12:11: notified by [facility] that denial has been upheld.  Will not be able to pursue CIR.  I've updated TOC/MD.  Will update pt at bedside this afternoon.   Reche Lowers, PT, DPT Admissions Coordinator [phone] 07/10/24  9:19 AM

## 2024-07-11 ENCOUNTER — Telehealth: Payer: Self-pay

## 2024-07-11 DIAGNOSIS — I619 Nontraumatic intracerebral hemorrhage, unspecified: Secondary | ICD-10-CM | POA: Diagnosis not present

## 2024-07-11 NOTE — Transitions of Care (Post Inpatient/ED Visit) (Signed)
 07/11/2024  Name: [name] MRN: [id number] DOB: 04-May-1946  Today's TOC FU Call Status: Today's TOC FU Call Status:: Successful TOC FU Call Completed TOC FU Call Complete Date: 07/11/24 Patient's Name and Date of Birth confirmed.  Transition Care Management Follow-up Telephone Call Date of Discharge: 07/10/24 Discharge Facility: [name]) Type of Discharge: Inpatient Admission Primary Inpatient Discharge Diagnosis:: Intraparenchymal hematoma of brain s/p syncopal episode and collapse How have you been since you were released from the hospital?: Better Any questions or concerns?: Yes Patient Questions/Concerns:: Home health status-CM made contact with [facility] Patient Questions/Concerns Addressed: Other:  Items Reviewed: Did you receive and understand the discharge instructions provided?: Yes Medications obtained,verified, and reconciled?: Yes (Medications Reviewed) Any new allergies since your discharge?: No Dietary orders reviewed?: Yes Type of Diet Ordered:: Low sodium heart healthy Do you have support at home?: Yes People in Home [RPT]: spouse, child(ren), adult Name of Support/Comfort Primary Source: Robert-spouse  Medications Reviewed Today: Medications Reviewed Today     Reviewed by Estle Huguley, RN (Case Manager) on 07/11/24 at 1323  Med List Status: <None>   Medication Order Taking? Sig Documenting Provider Last Dose Status Informant  acetaminophen  (TYLENOL ) 325 MG tablet [id number] Yes Take 2 tablets (650 mg total) by mouth every 6 (six) hours as needed. [name], DO  Active Spouse/Significant Other, Pharmacy Records  albuterol  (VENTOLIN  HFA) 108 [id number] Base) MCG/ACT inhaler [id number] Yes Inhale 2 puffs into the lungs every 6 (six) hours as needed for wheezing or shortness of breath. [provider]  Active Spouse/Significant Other, Pharmacy Records  amiodarone  (PACERONE ) 200 MG tablet [id number] Yes Take 1 tablet (200 mg total) by mouth daily.   Patient taking differently: Take 200 mg by mouth in the morning.   Mealor, [name], MD  Active Spouse/Significant Other, Pharmacy Records           Med Note WORLEY, ALASK[place] S   Thu Mar 14, 2024 10:51 AM)    atorvastatin  (LIPITOR) 40 MG tablet [id number]  Take 1 tablet (40 mg total) by mouth daily. [name], MD  Active   azelastine  (ASTELIN ) 0.1 % nasal spray [id number]  Place 1 spray into both nostrils 2 (two) times daily. Use in each nostril as directed [name], NP  Active Spouse/Significant Other, Pharmacy Records  bisoprolol  (ZEBETA ) 5 MG tablet [id number]  Take 1 tablet (5 mg total) by mouth daily.  Patient taking differently: Take 5 mg by mouth in the morning.   Franchester, Cadence H, PA-C  Active Spouse/Significant Other, Pharmacy Records  budesonide -formoterol  (SYMBICORT ) 80-4.5 MCG/ACT inhaler [id number]  One puff first thing in am and 12 hours later  Patient taking differently: One puff first thing in am and 12 hours later. Use 1 week then swap to Trelegy for a week.   Darlean Ozell NOVAK, MD  Active Spouse/Significant Other, Pharmacy Records  calcium  carbonate (TUMS - DOSED IN MG ELEMENTAL CALCIUM ) 500 MG chewable tablet [id number]  Chew 2-3 tablets by mouth as needed for indigestion or heartburn. [provider]  Active Spouse/Significant Other, Pharmacy Records  dextromethorphan  15 MG/5ML syrup [id number]  Take 10 mLs by mouth as needed for cough. [provider]  Active Spouse/Significant Other, Pharmacy Records  escitalopram  (LEXAPRO ) 10 MG tablet [id number]  Take 10 mg by mouth daily with breakfast. [provider]  Active Spouse/Significant Other, Pharmacy Records           Med Note BLASE LONELL MARLA [place] Apr 30, 2020  5:38 PM)    famotidine  (PEPCID ) 20 MG tablet [id number]  TAKE ONE TABLET BY MOUTH EVERY DAY AFTER SUPPER Wert, Michael B, MD  Active Spouse/Significant Other, Pharmacy Records  FARXIGA  10 MG TABS tablet [id number]  Take 10 mg by mouth  in the morning. [provider]  Active Spouse/Significant Other, Pharmacy Records  fexofenadine (ALLEGRA) 180 MG tablet [id number]  Take 180 mg by mouth in the morning. [provider]  Active Spouse/Significant Other, Pharmacy Records  fluticasone  (FLONASE ) 50 MCG/ACT nasal spray [id number]  Place 1 spray into both nostrils in the morning. [name], MD  Active Spouse/Significant Other, Pharmacy Records  levothyroxine  (SYNTHROID ) 100 MCG tablet [id number]  Take 100 mcg by mouth daily before breakfast. [provider]  Active Spouse/Significant Other, Pharmacy Records  metFORMIN  (GLUCOPHAGE ) 500 MG tablet [id number]  Take 1 tablet (500 mg total) by mouth 2 (two) times daily with a meal. Gerome, Tillman CROME, PA-C  Active Spouse/Significant Other, Pharmacy Records  Multiple Vitamin (MULTIVITAMIN WITH MINERALS) TABS tablet [id number]  Take 1 tablet by mouth in the morning. [provider]  Active Spouse/Significant Other, Pharmacy Records  oxyCODONE  (OXY IR/ROXICODONE ) 5 MG immediate release tablet [id number]  Take 1 tablet (5 mg total) by mouth every 6 (six) hours as needed for moderate pain (pain score 4-6). Gherghe, Costin M, MD  Active   oxymetazoline (AFRIN) 0.05 % nasal spray [id number]  Place 1 spray into both nostrils every 20 (twenty) minutes as needed (nose bleed.). Gherghe, Costin M, MD  Active   pantoprazole  (PROTONIX ) 40 MG tablet [id number]  TAKE 1 TABLET 30 TO 60 MINUTES BEFORE FIRST MEAL OF THE DAY.  Patient taking differently: Take 40 mg by mouth daily before breakfast.   Darlean Ozell NOVAK, MD  Active Spouse/Significant Other, Pharmacy Records  Polyethyl Glycol-Propyl Glycol (SYSTANE OP) [id number]  Place 1 drop into both eyes daily as needed (dry eyes). [provider]  Active Spouse/Significant Other, Pharmacy Records  potassium chloride  SA (KLOR-CON  M) 20 MEQ tablet [id number]  Take 1 tablet (20 mEq total) by mouth daily.  Patient taking  differently: Take 20 mEq by mouth in the morning.   Davia Nydia POUR, MD  Expired 06/28/24 2359 Spouse/Significant Other, Pharmacy Records  torsemide  (DEMADEX ) 20 MG tablet [id number]  Take 20mg  (1 tab) by mouth every other day alternating with 40mg  (2 tabs) every other day  Patient taking differently: Take 20-40 mg by mouth See admin instructions. Take 20mg  (1 tab) by mouth every other day alternating with 40mg  (2 tabs) every other day.   [name], MD  Active Spouse/Significant Other, Pharmacy Records           Med Note EFRAIM, ALFREIDA CROME   Fri Jun 28, 2024  3:49 PM) Patient took 40mg  this morning  TRELEGY ELLIPTA  100-62.5-25 MCG/ACT AEPB [id number]  Inhale 1 puff into the lungs daily. [provider]  Active Spouse/Significant Other, Pharmacy Records  warfarin (COUMADIN ) 2.5 MG tablet [id number]  TAKE AS DIRECTED BY COUMADIN  CLINIC.  Patient taking differently: Take 2.5 mg by mouth See admin instructions. TAKE AS DIRECTED BY COUMADIN  CLINIC. Take 1 tablet by mouth every day, except on Thursday and Sunday take a 1/2 tablet.   [name], MD  Active Spouse/Significant Other, Pharmacy Records            Home Care and Equipment/Supplies: Were Home Health Services Ordered?: Yes Name of Home Health Agency:: [facility]- contacted [facility]tha [facility] to  call today for start of care  Has Agency set up a time to come to your home?: No Any new equipment or medical supplies ordered?: Yes Name of Medical supply agency?: [employer]- Rolling walker to be delivered Were you able to get the equipment/medical supplies?: No Do you have any questions related to the use of the equipment/supplies?: No  Functional Questionnaire: Do you need assistance with bathing/showering or dressing?: Yes (spouse assists) Do you need assistance with meal preparation?: Yes (spouse does meals) Do you need assistance with eating?: No Do you have difficulty maintaining continence: Yes (Wears depends) Do you need  assistance with getting out of bed/getting out of a chair/moving?: Yes (spouse assisting) Do you have difficulty managing or taking your medications?: Yes (spouse handles medications)  Follow up appointments reviewed: PCP Follow-up appointment confirmed?: No (spouse to call.) MD Provider Line [phone] Given: No Specialist Hospital Follow-up appointment confirmed?: Yes Date of Specialist follow-up appointment?: 07/23/24 Follow-Up Specialty Provider:: Dr. Tobie ENT Do you need transportation to your follow-up appointment?: No Do you understand care options if your condition(s) worsen?: Yes-patient verbalized understanding  SDOH Interventions Today    Flowsheet Row Most Recent Value  SDOH Interventions   Food Insecurity Interventions Intervention Not Indicated  Housing Interventions Intervention Not Indicated  Transportation Interventions Intervention Not Indicated  Utilities Interventions Intervention Not Indicated    Orlen Leedy J. Hajra Port RN, MSN [facility]  [facility], [facility] Health RN Care Manager Direct Dial: [phone]  Fax: [phone] Website: [email or web address]

## 2024-07-11 NOTE — Patient Instructions (Signed)
 Visit Information  Thank you for taking time to visit with me today. Please don't hesitate to contact me if I can be of assistance to you before our next scheduled telephone appointment.  Our next appointment is by telephone on 07/19/24 at 1200 pm  Following is a copy of your care plan:   Goals Addressed             This Visit's Progress    VBCI Transitions of Care (TOC) Care Plan       Problems:  Recent Hospitalization for treatment of Intraparenchymal hematoma of brain s/p syncopal episode and collapseIntraparenchymal hematoma of brain s/p syncopal episode and collapse Knowledge Deficit Related to Intraparenchymal hematoma of brain s/p syncopal episode and collapseIntraparenchymal hematoma of brain s/p syncopal episode and collapse Facial Lacerations and fracture  Goal:  Over the next 30 days, the patient will not experience hospital readmission  Interventions:  Transitions of Care: Doctor Visits  - discussed the importance of doctor visits Contacted Health RN/OT/PT - Called to [facility] who has received referral and will be in contact with family today for start of care.  Reviewed Signs and symptoms of infection  07/11/24 Spoke with spouse who states patient is very weak and they are doing the best they can. Patient insurance denied CIR and spouse states he did not want to send her to SNF.  He states that walker has not arrived and that he has talked with [employer] and waiting for a return call on status of walker.  He states he is leading patient with walking and they are managing. He also states he has someone coming to the home today for possible private duty to assist.  He states that son-in-law and neighbor helped to get patient in home. Discussed follow up appointments for PCP, Opthalmology, and ENT and importance.  Spouse states hopefully private assistant will be able to help with getting patient to appointments.  Spouse uses pill box for medications and able to review current  medications.  Reviewed signs of infection for facial lacerations.  Advised to seek immediate medical attention for any loss of consciousness, headache that won't go away, vomiting, weakness, blurred vision, trouble staying steady.  12:57 pm Spoke with Damien at [place]. Referral for home health currently being worked. She states outreach will be today for start of care.  CM called spouse back to let him know home health status.  He verbalized understanding.     Patient Self Care Activities:  Attend all scheduled provider appointments Call pharmacy for medication refills 3-7 days in advance of running out of medications Call provider office for new concerns or questions  Notify RN Care Manager of TOC call rescheduling needs Participate in Transition of Care Program/Attend TOC scheduled calls Take medications as prescribed   Notify physician for any changes or concerns.   Monitor facial lacerations for swelling, redness, pain, and pus  Plan:  The patient has been provided with contact information for the care management team and has been advised to call with any health related questions or concerns.         Patient verbalizes understanding of instructions and care plan provided today and agrees to view in MyChart. Active MyChart status and patient understanding of how to access instructions and care plan via MyChart confirmed with patient.     The patient has been provided with contact information for the care management team and has been advised to call with any health related questions or concerns.   Please  call the care guide team at [phone] if you need to cancel or reschedule your appointment.   Please call the Suicide and Crisis Lifeline: 988 if you are experiencing a Mental Health or Behavioral Health Crisis or need someone to talk to.  Naje Rice J. Ilona Colley RN, MSN [facility], [facility] Health RN Care Manager Direct Dial: [phone]  Fax:  [phone] Website: [email or web address]

## 2024-07-13 ENCOUNTER — Other Ambulatory Visit: Payer: Self-pay

## 2024-07-13 ENCOUNTER — Emergency Department (HOSPITAL_COMMUNITY)
Admission: EM | Admit: 2024-07-13 | Discharge: 2024-07-13 | Disposition: A | Discharge status: Home / Self Care | Attending: Emergency Medicine | Admitting: Emergency Medicine

## 2024-07-13 ENCOUNTER — Encounter (HOSPITAL_COMMUNITY): Payer: Self-pay

## 2024-07-13 ENCOUNTER — Emergency Department (HOSPITAL_COMMUNITY)

## 2024-07-13 DIAGNOSIS — I619 Nontraumatic intracerebral hemorrhage, unspecified: Secondary | ICD-10-CM | POA: Diagnosis not present

## 2024-07-13 DIAGNOSIS — E860 Dehydration: Secondary | ICD-10-CM

## 2024-07-13 DIAGNOSIS — S0012XA Contusion of left eyelid and periocular area, initial encounter: Secondary | ICD-10-CM | POA: Diagnosis not present

## 2024-07-13 DIAGNOSIS — Z952 Presence of prosthetic heart valve: Secondary | ICD-10-CM | POA: Insufficient documentation

## 2024-07-13 DIAGNOSIS — Z8673 Personal history of transient ischemic attack (TIA), and cerebral infarction without residual deficits: Secondary | ICD-10-CM | POA: Diagnosis not present

## 2024-07-13 DIAGNOSIS — I4891 Unspecified atrial fibrillation: Secondary | ICD-10-CM | POA: Diagnosis not present

## 2024-07-13 DIAGNOSIS — R55 Syncope and collapse: Secondary | ICD-10-CM | POA: Diagnosis present

## 2024-07-13 DIAGNOSIS — W19XXXA Unspecified fall, initial encounter: Secondary | ICD-10-CM | POA: Diagnosis not present

## 2024-07-13 DIAGNOSIS — N390 Urinary tract infection, site not specified: Secondary | ICD-10-CM | POA: Diagnosis not present

## 2024-07-13 DIAGNOSIS — R519 Headache, unspecified: Secondary | ICD-10-CM | POA: Diagnosis not present

## 2024-07-13 DIAGNOSIS — S0990XA Unspecified injury of head, initial encounter: Secondary | ICD-10-CM | POA: Diagnosis not present

## 2024-07-13 DIAGNOSIS — I6789 Other cerebrovascular disease: Secondary | ICD-10-CM | POA: Insufficient documentation

## 2024-07-13 DIAGNOSIS — R059 Cough, unspecified: Secondary | ICD-10-CM | POA: Diagnosis not present

## 2024-07-13 DIAGNOSIS — Z7901 Long term (current) use of anticoagulants: Secondary | ICD-10-CM | POA: Insufficient documentation

## 2024-07-13 DIAGNOSIS — S066XAA Traumatic subarachnoid hemorrhage with loss of consciousness status unknown, initial encounter: Secondary | ICD-10-CM | POA: Diagnosis not present

## 2024-07-13 DIAGNOSIS — R300 Dysuria: Secondary | ICD-10-CM | POA: Diagnosis present

## 2024-07-13 DIAGNOSIS — Z79899 Other long term (current) drug therapy: Secondary | ICD-10-CM | POA: Insufficient documentation

## 2024-07-13 DIAGNOSIS — S0011XA Contusion of right eyelid and periocular area, initial encounter: Secondary | ICD-10-CM | POA: Diagnosis not present

## 2024-07-13 LAB — CBC WITH DIFFERENTIAL/PLATELET
Abs Immature Granulocytes: 0.04 K/uL (ref 0.00–0.07)
Basophils Absolute: 0.1 K/uL (ref 0.0–0.1)
Basophils Relative: 1 %
Eosinophils Absolute: 0.3 K/uL (ref 0.0–0.5)
Eosinophils Relative: 4 %
HCT: 39.2 % (ref 36.0–46.0)
Hemoglobin: 11.9 g/dL — ABNORMAL LOW (ref 12.0–15.0)
Immature Granulocytes: 1 %
Lymphocytes Relative: 14 %
Lymphs Abs: 0.9 K/uL (ref 0.7–4.0)
MCH: 27 pg (ref 26.0–34.0)
MCHC: 30.4 g/dL (ref 30.0–36.0)
MCV: 89.1 fL (ref 80.0–100.0)
Monocytes Absolute: 0.7 K/uL (ref 0.1–1.0)
Monocytes Relative: 10 %
Neutro Abs: 4.6 K/uL (ref 1.7–7.7)
Neutrophils Relative %: 70 %
Platelets: 260 K/uL (ref 150–400)
RBC: 4.4 MIL/uL (ref 3.87–5.11)
RDW: 18.6 % — ABNORMAL HIGH (ref 11.5–15.5)
WBC: 6.6 K/uL (ref 4.0–10.5)
nRBC: 0 % (ref 0.0–0.2)

## 2024-07-13 LAB — TROPONIN T, HIGH SENSITIVITY: Troponin T High Sensitivity: 25 ng/L — ABNORMAL HIGH (ref 0–19)

## 2024-07-13 LAB — URINALYSIS, ROUTINE W REFLEX MICROSCOPIC
Bilirubin Urine: NEGATIVE
Glucose, UA: >=500 mg/dL — AB
Hgb urine dipstick: NEGATIVE
Ketones, ur: NEGATIVE mg/dL
Nitrite: NEGATIVE
Protein, ur: NEGATIVE mg/dL
Specific Gravity, Urine: 1.013 (ref 1.005–1.030)
pH: 5 (ref 5.0–8.0)

## 2024-07-13 LAB — COMPREHENSIVE METABOLIC PANEL WITH GFR
ALT: 20 U/L (ref 0–44)
AST: 26 U/L (ref 15–41)
Albumin: 3.9 g/dL (ref 3.5–5.0)
Alkaline Phosphatase: 85 U/L (ref 38–126)
Anion gap: 15 (ref 5–15)
BUN: 18 mg/dL (ref 8–23)
CO2: 25 mmol/L (ref 22–32)
Calcium: 8.6 mg/dL — ABNORMAL LOW (ref 8.9–10.3)
Chloride: 100 mmol/L (ref 98–111)
Creatinine, Ser: 1.65 mg/dL — ABNORMAL HIGH (ref 0.44–1.00)
GFR, Estimated: 31 mL/min — ABNORMAL LOW (ref >60)
Glucose, Bld: 120 mg/dL — ABNORMAL HIGH (ref 70–99)
Potassium: 3.5 mmol/L (ref 3.5–5.1)
Sodium: 140 mmol/L (ref 135–145)
Total Bilirubin: 0.6 mg/dL (ref 0.0–1.2)
Total Protein: 7.1 g/dL (ref 6.5–8.1)

## 2024-07-13 LAB — RESP PANEL BY RT-PCR (RSV, FLU A&B, COVID)  RVPGX2
Influenza A by PCR: NEGATIVE
Influenza B by PCR: NEGATIVE
Resp Syncytial Virus by PCR: NEGATIVE
SARS Coronavirus 2 by RT PCR: NEGATIVE

## 2024-07-13 LAB — I-STAT CHEM 8, ED
BUN: 17 mg/dL (ref 8–23)
Calcium, Ion: 1.11 mmol/L — ABNORMAL LOW (ref 1.15–1.40)
Chloride: 102 mmol/L (ref 98–111)
Creatinine, Ser: 1.7 mg/dL — ABNORMAL HIGH (ref 0.44–1.00)
Glucose, Bld: 122 mg/dL — ABNORMAL HIGH (ref 70–99)
HCT: 39 % (ref 36.0–46.0)
Hemoglobin: 13.3 g/dL (ref 12.0–15.0)
Potassium: 3.4 mmol/L — ABNORMAL LOW (ref 3.5–5.1)
Sodium: 139 mmol/L (ref 135–145)
TCO2: 22 mmol/L (ref 22–32)

## 2024-07-13 LAB — PROTIME-INR
INR: 1.6 — ABNORMAL HIGH (ref 0.8–1.2)
Prothrombin Time: 20.3 s — ABNORMAL HIGH (ref 11.4–15.2)

## 2024-07-13 LAB — MAGNESIUM: Magnesium: 2.1 mg/dL (ref 1.7–2.4)

## 2024-07-13 MED ORDER — CEFTRIAXONE SODIUM 1 G IJ SOLR
1.0000 g | Freq: Once | INTRAMUSCULAR | Status: AC
  Administered 2024-07-13: 1 g via INTRAVENOUS
  Filled 2024-07-13: qty 10

## 2024-07-13 MED ORDER — CEPHALEXIN 500 MG PO CAPS: 500.0000 mg | ORAL_CAPSULE | Freq: Three times a day (TID) | ORAL | 0 refills | Status: AC

## 2024-07-13 MED ORDER — SODIUM CHLORIDE 0.9 % IV BOLUS
500.0000 mL | Freq: Once | INTRAVENOUS | Status: AC
  Administered 2024-07-13: 500 mL via INTRAVENOUS

## 2024-07-13 NOTE — ED Triage Notes (Signed)
 Pt came in EMS from a fall THIS MORNING. Pt stated she has had a headache that has not went away since this morning. Pt took hydrocodone  that did not help. Pt was seen at [facility] wednesday for a head bleed. Pt is on COUMADIN  and do not remember the last time she took it.

## 2024-07-13 NOTE — ED Provider Notes (Signed)
 [place] EMERGENCY DEPARTMENT AT [facility] Provider Note   CSN: [id number] Arrival date & time: 07/13/24  1540     Patient presents with: [name]   [name] is a 78 y.o. female.    Fall   This patient is a 78 year old female who had previously been treated with Coumadin  for atrial fibrillation, had been on amiodarone , she has rheumatic heart disease status post both atrial and mitral valve replacements, she had had a syncopal episode at home and was brought to the hospital after sustaining a facial injury.  The cervical and maxillofacial CT scans as well as the head showed an intraparenchymal hemorrhage in the left frontal brain and minimal subarachnoid hemorrhage overlying this area.  There is also some minor facial fractures.  She obtained a brain MRI which showed no signs of stroke or cause of her syncope, echocardiogram showed valvular structures looked good, it was recommended at discharge after the patient was seen by neurosurgery that the patient was stable there was no surgical intervention necessary and she was resumed on her warfarin therapy per the discharge summary.  The patient reports that she was doing okay however this morning she noticed that she has been diffusely weak and had a headache, the headache is essentially gone at this time but she feels like she has no energy.  There is no focal weakness, she denies nausea or vomiting, she has no chest pain or shortness of breath, no dysuria or diarrhea, no swelling or rashes.  She ambulates without a cane    Prior to Admission medications   Medication Sig Start Date End Date Taking? Authorizing Provider  acetaminophen  (TYLENOL ) 325 MG tablet Take 2 tablets (650 mg total) by mouth every 6 (six) hours as needed. 01/20/24   [name], DO  albuterol  (VENTOLIN  HFA) 108 (90 Base) MCG/ACT inhaler Inhale 2 puffs into the lungs every 6 (six) hours as needed for wheezing or shortness of breath. 08/17/23   [provider]  amiodarone  (PACERONE ) 200 MG tablet Take 1 tablet (200 mg total) by mouth daily. Patient taking differently: Take 200 mg by mouth in the morning. 02/02/24   Mealor, [name], MD  atorvastatin  (LIPITOR) 40 MG tablet Take 1 tablet (40 mg total) by mouth daily. 07/05/24   [name], MD  azelastine  (ASTELIN ) 0.1 % nasal spray Place 1 spray into both nostrils 2 (two) times daily. Use in each nostril as directed 04/30/24   [name], NP  bisoprolol  (ZEBETA ) 5 MG tablet Take 1 tablet (5 mg total) by mouth daily. Patient taking differently: Take 5 mg by mouth in the morning. 02/23/24   Furth, Cadence H, PA-C  budesonide -formoterol  (SYMBICORT ) 80-4.5 MCG/ACT inhaler One puff first thing in am and 12 hours later Patient taking differently: One puff first thing in am and 12 hours later. Use 1 week then swap to Trelegy for a week. 05/13/24   Darlean Ozell NOVAK, MD  calcium  carbonate (TUMS - DOSED IN MG ELEMENTAL CALCIUM ) 500 MG chewable tablet Chew 2-3 tablets by mouth as needed for indigestion or heartburn.    [provider]  dextromethorphan  15 MG/5ML syrup Take 10 mLs by mouth as needed for cough.    [provider]  escitalopram  (LEXAPRO ) 10 MG tablet Take 10 mg by mouth daily with breakfast. 04/10/15   [provider]  famotidine  (PEPCID ) 20 MG tablet TAKE ONE TABLET BY MOUTH EVERY DAY AFTER SUPPER 12/25/23   Wert, Michael B,  MD  FARXIGA  10 MG TABS tablet Take 10 mg by mouth in the morning. 03/28/23   [provider]  fexofenadine (ALLEGRA) 180 MG tablet Take 180 mg by mouth in the morning.    [provider]  fluticasone  (FLONASE ) 50 MCG/ACT nasal spray Place 1 spray into both nostrils in the morning. 09/05/13   [name], MD  levothyroxine  (SYNTHROID ) 100 MCG tablet Take 100 mcg by mouth daily before breakfast.    [provider]  metFORMIN  (GLUCOPHAGE ) 500 MG tablet Take 1 tablet (500 mg total) by mouth 2 (two) times  daily with a meal. 08/03/13   Collins, Tillman CROME, PA-C  Multiple Vitamin (MULTIVITAMIN WITH MINERALS) TABS tablet Take 1 tablet by mouth in the morning.    [provider]  oxyCODONE  (OXY IR/ROXICODONE ) 5 MG immediate release tablet Take 1 tablet (5 mg total) by mouth every 6 (six) hours as needed for moderate pain (pain score 4-6). 07/10/24   Gherghe, Costin M, MD  oxymetazoline (AFRIN) 0.05 % nasal spray Place 1 spray into both nostrils every 20 (twenty) minutes as needed (nose bleed.). 07/10/24   Gherghe, Costin M, MD  pantoprazole  (PROTONIX ) 40 MG tablet TAKE 1 TABLET 30 TO 60 MINUTES BEFORE FIRST MEAL OF THE DAY. Patient taking differently: Take 40 mg by mouth daily before breakfast. 03/15/24   Darlean Ozell NOVAK, MD  Polyethyl Glycol-Propyl Glycol (SYSTANE OP) Place 1 drop into both eyes daily as needed (dry eyes).    [provider]  potassium chloride  SA (KLOR-CON  M) 20 MEQ tablet Take 1 tablet (20 mEq total) by mouth daily. Patient taking differently: Take 20 mEq by mouth in the morning. 11/14/22 07/11/24  Rai, Ripudeep MARLA, MD  torsemide  (DEMADEX ) 20 MG tablet Take 20mg  (1 tab) by mouth every other day alternating with 40mg  (2 tabs) every other day Patient taking differently: Take 20-40 mg by mouth See admin instructions. Take 20mg  (1 tab) by mouth every other day alternating with 40mg  (2 tabs) every other day. 04/12/24   [name], MD  TRELEGY ELLIPTA  100-62.5-25 MCG/ACT AEPB Inhale 1 puff into the lungs daily. 06/25/24   [provider]  warfarin (COUMADIN ) 2.5 MG tablet TAKE AS DIRECTED BY COUMADIN  CLINIC. 05/13/24   [name], MD    Allergies: Patient has no known allergies.    Review of Systems  All other systems reviewed and are negative.   Updated Vital Signs BP (!) 124/56 (BP Location: Left Arm)   Pulse (!) 57   Temp 98.8 F (37.1 C) (Oral)   Resp 16   Ht 1.6 m (5' 3)   Wt 69.9 kg   SpO2 92%   BMI 27.28 kg/m   Physical Exam Vitals and  nursing note reviewed.  Constitutional:      General: She is not in acute distress.    Appearance: She is well-developed.  HENT:     Head: Normocephalic.     Comments: Evolving bruising and ecchymosis to the periorbital regions    Mouth/Throat:     Pharynx: No oropharyngeal exudate.  Eyes:     General: No scleral icterus.       Right eye: No discharge.        Left eye: No discharge.     Conjunctiva/sclera: Conjunctivae normal.     Pupils: Pupils are equal, round, and reactive to light.  Neck:     Thyroid : No thyromegaly.     Vascular: No JVD.  Cardiovascular:  Rate and Rhythm: Normal rate and regular rhythm.     Heart sounds: Normal heart sounds. No murmur heard.    No friction rub. No gallop.  Pulmonary:     Effort: Pulmonary effort is normal. No respiratory distress.     Breath sounds: Normal breath sounds. No wheezing or rales.  Abdominal:     General: Bowel sounds are normal. There is no distension.     Palpations: Abdomen is soft. There is no mass.     Tenderness: There is no abdominal tenderness.  Musculoskeletal:        General: No tenderness. Normal range of motion.     Cervical back: Normal range of motion and neck supple.     Right lower leg: No edema.     Left lower leg: No edema.  Lymphadenopathy:     Cervical: No cervical adenopathy.  Skin:    General: Skin is warm and dry.     Findings: No erythema or rash.  Neurological:     Mental Status: She is alert.     Coordination: Coordination normal.     Comments: The patient is able to follow commands, she can sit up in the bed, she can raise all 4 extremities and has normal coordination, normal speech, normal memory, she appears generally weak and slightly somnolent  Psychiatric:        Behavior: Behavior normal.     (all labs ordered are listed, but only abnormal results are displayed) Labs Reviewed  I-STAT CHEM 8, ED - Abnormal; Notable for the following components:      Result Value   Potassium 3.4  (*)    Creatinine, Ser 1.70 (*)    Glucose, Bld 122 (*)    Calcium , Ion 1.11 (*)    All other components within normal limits  CBC WITH DIFFERENTIAL/PLATELET  COMPREHENSIVE METABOLIC PANEL WITH GFR  MAGNESIUM   URINALYSIS, ROUTINE W REFLEX MICROSCOPIC  PROTIME-INR    EKG: None  Radiology: No results found.   Procedures   Medications Ordered in the ED - No data to display                                  Medical Decision Making Amount and/or Complexity of Data Reviewed Labs: ordered. Radiology: ordered. ECG/medicine tests: ordered.  Risk Prescription drug management.    This patient presents to the ED for concern of generalized weakness, has had some progressive renal dysfunction with a creatinine of 1.7 otherwise unremarkable, would consider worsening of intracranial hemorrhage, infection, electrolyte abnormalities, dehydration, seizure seems less likely this, this involves an extensive number of treatment options, and is a complaint that carries with it a high risk of complications and morbidity.    Co morbidities / Chronic conditions that complicate the patient evaluation  Valvular disease   Additional history obtained:  Additional history obtained from EMR External records from outside source obtained and reviewed including medical record including most recent admission, see above in the history   Lab Tests:  I Ordered, and personally interpreted labs.  The pertinent results include: Urinalysis with white blood cells consistent with possible infection, COVID and flu negative, CBC without leukocytosis, metabolic panel with renal insufficiency, no electrolyte disturbance, magnesium  normal, INR 1.6, troponin 25   Imaging Studies ordered:  I ordered imaging studies including CT scan of the brain I independently visualized and interpreted imaging which showed resolution of the previously seen hemorrhage I agree  with the radiologist interpretation   Cardiac  Monitoring: / EKG:  The patient was maintained on a cardiac monitor.  I personally viewed and interpreted the cardiac monitored which showed an underlying rhythm of: Sinus rhythm   Problem List / ED Course / Critical interventions / Medication management  Patient given IV fluids and antibiotics, improved Spouse has arrived and states that she looks much better, had some generalized weakness this afternoon I have reviewed the patients home medicines and have made adjustments as needed   Social Determinants of Health:  UTI, antibiotics given   Test / Admission - Considered:  Admission but the patient is hemodynamically stable      Final diagnoses:  None    ED Discharge Orders     None          Cleotilde Rogue, MD 07/13/24 1959

## 2024-07-13 NOTE — Discharge Instructions (Signed)
 Your testing today shows that you have a urinary infection, and that you were a little bit dehydrated.  I would strongly recommend that you call your doctor Monday morning, they will need to follow-up your urinary culture and recheck your kidney function, in the meantime drink plenty of clear liquids, take the cephalexin 3 times a day until the medication is completed  Thank you for allowing us  to treat you in the emergency department today.  After reviewing your examination and potential testing that was done it appears that you are safe to go home.  I would like for you to follow-up with your doctor within the next several days, have them obtain your records and follow-up with them to review all potential tests and results from your visit.  If you should develop severe or worsening symptoms return to the emergency department immediately

## 2024-07-14 DIAGNOSIS — N390 Urinary tract infection, site not specified: Secondary | ICD-10-CM | POA: Diagnosis not present

## 2024-07-14 DIAGNOSIS — S022XXD Fracture of nasal bones, subsequent encounter for fracture with routine healing: Secondary | ICD-10-CM | POA: Diagnosis not present

## 2024-07-14 DIAGNOSIS — D631 Anemia in chronic kidney disease: Secondary | ICD-10-CM | POA: Diagnosis not present

## 2024-07-14 DIAGNOSIS — E1143 Type 2 diabetes mellitus with diabetic autonomic (poly)neuropathy: Secondary | ICD-10-CM | POA: Diagnosis not present

## 2024-07-14 DIAGNOSIS — I130 Hypertensive heart and chronic kidney disease with heart failure and stage 1 through stage 4 chronic kidney disease, or unspecified chronic kidney disease: Secondary | ICD-10-CM | POA: Diagnosis not present

## 2024-07-14 DIAGNOSIS — I5032 Chronic diastolic (congestive) heart failure: Secondary | ICD-10-CM | POA: Diagnosis not present

## 2024-07-14 DIAGNOSIS — S02832D Fracture of medial orbital wall, left side, subsequent encounter for fracture with routine healing: Secondary | ICD-10-CM | POA: Diagnosis not present

## 2024-07-14 DIAGNOSIS — N1832 Chronic kidney disease, stage 3b: Secondary | ICD-10-CM | POA: Diagnosis not present

## 2024-07-14 DIAGNOSIS — E1122 Type 2 diabetes mellitus with diabetic chronic kidney disease: Secondary | ICD-10-CM | POA: Diagnosis not present

## 2024-07-16 DIAGNOSIS — S02842A Fracture of lateral orbital wall, left side, initial encounter for closed fracture: Secondary | ICD-10-CM | POA: Diagnosis not present

## 2024-07-16 DIAGNOSIS — I612 Nontraumatic intracerebral hemorrhage in hemisphere, unspecified: Secondary | ICD-10-CM | POA: Diagnosis not present

## 2024-07-16 DIAGNOSIS — N390 Urinary tract infection, site not specified: Secondary | ICD-10-CM | POA: Diagnosis not present

## 2024-07-16 DIAGNOSIS — R55 Syncope and collapse: Secondary | ICD-10-CM | POA: Diagnosis not present

## 2024-07-16 LAB — URINE CULTURE: Culture: >=100000 — AB

## 2024-07-17 ENCOUNTER — Telehealth (HOSPITAL_BASED_OUTPATIENT_CLINIC_OR_DEPARTMENT_OTHER): Payer: Self-pay | Admitting: Emergency Medicine

## 2024-07-17 NOTE — Telephone Encounter (Signed)
 Post ED Visit - Positive Culture Follow-up  Culture report reviewed by antimicrobial stewardship pharmacist: [name] []  Rankin Dee, Pharm.D. []  Venetia Gully, [address].D., BCPS AQ-ID []  Garrel Crews, Pharm.D., BCPS []  Almarie Lunger, Pharm.D., BCPS []  [employer], [address].D., BCPS, AAHIVP []  Rosaline Bihari, Pharm.D., BCPS, AAHIVP []  Vernell Meier, PharmD, BCPS []  Latanya Hint, PharmD, BCPS []  Donald Medley, PharmD, BCPS [x]  Amon Rocher, PharmD []  Dorothyann Alert, PharmD, BCPS []  Morene Babe, PharmD  [facility] []  Rosaline Edison, PharmD []  Romona Bliss, PharmD []  Dolphus Roller, PharmD []  Veva Seip, Rph []  Vernell Daunt) Leonce, PharmD []  Eva Allis, PharmD []  Rosaline Millet, PharmD []  Iantha Batch, PharmD []  Arvin Gauss, PharmD []  Wanda Hasting, PharmD []  Ronal Rav, PharmD []  Rocky Slade, PharmD []  Bard Jeans, PharmD   Positive urine culture Treated with Cephalexin, organism sensitive to the same and no further patient follow-up is required at this time.  [name] [name] 07/17/2024, 4:40 PM

## 2024-07-18 DIAGNOSIS — S02832D Fracture of medial orbital wall, left side, subsequent encounter for fracture with routine healing: Secondary | ICD-10-CM | POA: Diagnosis not present

## 2024-07-18 DIAGNOSIS — I130 Hypertensive heart and chronic kidney disease with heart failure and stage 1 through stage 4 chronic kidney disease, or unspecified chronic kidney disease: Secondary | ICD-10-CM | POA: Diagnosis not present

## 2024-07-18 DIAGNOSIS — D631 Anemia in chronic kidney disease: Secondary | ICD-10-CM | POA: Diagnosis not present

## 2024-07-18 DIAGNOSIS — N1832 Chronic kidney disease, stage 3b: Secondary | ICD-10-CM | POA: Diagnosis not present

## 2024-07-18 DIAGNOSIS — N390 Urinary tract infection, site not specified: Secondary | ICD-10-CM | POA: Diagnosis not present

## 2024-07-18 DIAGNOSIS — E1143 Type 2 diabetes mellitus with diabetic autonomic (poly)neuropathy: Secondary | ICD-10-CM | POA: Diagnosis not present

## 2024-07-18 DIAGNOSIS — E1122 Type 2 diabetes mellitus with diabetic chronic kidney disease: Secondary | ICD-10-CM | POA: Diagnosis not present

## 2024-07-18 DIAGNOSIS — I5032 Chronic diastolic (congestive) heart failure: Secondary | ICD-10-CM | POA: Diagnosis not present

## 2024-07-19 ENCOUNTER — Other Ambulatory Visit: Payer: Self-pay

## 2024-07-19 NOTE — Patient Instructions (Signed)
 Visit Information  Thank you for taking time to visit with me today. Please don't hesitate to contact me if I can be of assistance to you before our next scheduled telephone appointment.  Our next appointment is by telephone on 07/26/24 at 1200 pm  Following is a copy of your care plan:   Goals Addressed             This Visit's Progress    [facility] Transitions of Care (TOC) Care Plan       Problems:  Recent Hospitalization for treatment of Intraparenchymal hematoma of brain s/p syncopal episode and collapseIntraparenchymal hematoma of brain s/p syncopal episode and collapse Knowledge Deficit Related to Intraparenchymal hematoma of brain s/p syncopal episode and collapseIntraparenchymal hematoma of brain s/p syncopal episode and collapse Facial Lacerations and fracture  Goal:  Over the next 30 days, the patient will not experience hospital readmission  Interventions:  Transitions of Care: Doctor Visits  - discussed the importance of doctor visits Reviewed Signs and symptoms of infection  07/19/24 Spoke with spouse he states patient is doing some better.  She received walker from [employer] on 07/13/24 per spouse.He states she is able to get up on her own without the walker.  Recent ED visit after a fall. Patient found to have a UTI and currently on keflex.  They do have help in the home 4 hours a day/ 5 days a week,  Home health active with patient, RN, PT and OT.  PCP visit this week.  No changes from PCP but to follow up in a couple of weeks. Discussed upcoming appointment with Dr. Tobie ENT.    Reiterated signs of infection for facial lacerations.  Advised to    Patient Self Care Activities:  Attend all scheduled provider appointments Call pharmacy for medication refills 3-7 days in advance of running out of medications Call provider office for new concerns or questions  Notify RN Care Manager of TOC call rescheduling needs Participate in Transition of Care Program/Attend TOC scheduled  calls Take medications as prescribed   Notify physician for any changes or concerns.   Monitor facial lacerations for swelling, redness, pain, and pus Seek immediate medical attention for any loss of consciousness, headache that won't go away, vomiting, weakness, blurred vision, trouble staying steady.  Plan:  The patient has been provided with contact information for the care management team and has been advised to call with any health related questions or concerns.         Patient verbalizes understanding of instructions and care plan provided today and agrees to view in MyChart. Active MyChart status and patient understanding of how to access instructions and care plan via MyChart confirmed with patient.     The patient has been provided with contact information for the care management team and has been advised to call with any health related questions or concerns.   Please call the care guide team at [phone] if you need to cancel or reschedule your appointment.   Please call the Suicide and Crisis Lifeline: 988 if you are experiencing a Mental Health or Behavioral Health Crisis or need someone to talk to.  Lamiah Marmol J. Reymundo Winship RN, MSN [facility], [facility] RN Care Manager Direct Dial: [phone]  Fax: [phone] Website: [email or web address]

## 2024-07-19 NOTE — Transitions of Care (Post Inpatient/ED Visit) (Signed)
 Transition of Care week 2  Visit Note  07/19/2024  Name: [name] MRN: [id number]          DOB: 02-Jul-1946  Situation: Patient enrolled in [facility] 30-day program. Visit completed with spouse by telephone.   Background:   Initial Transition Care Management Follow-up Telephone Call Discharge Date and Diagnosis: 07/10/24, Intraparenchymal hematoma of brain s/p syncopal episode and collapse   Past Medical History:  Diagnosis Date   Anemia    Asthma 2010   Atrial fibrillation (HCC)    Atrial flutter (HCC)    Breast carcinoma (HCC) 1995   1995   Cardioembolic stroke (HCC) 01/2012   Right frontal in 01/2012; normal carotid ultrasound; possible LAA thrombus by TEE; virtual complete neurologic recovery   Carotid artery stenosis 1961   Chronic kidney disease, stage 2, mildly decreased GFR    GFR of approximately 60   Diabetes mellitus without complication (HCC) 5+ years ago   Controlled most of time   Diverticulosis of colon (without mention of hemorrhage) 2012   Dr. Golda   Fasting hyperglycemia    120 fasting   Gastroesophageal reflux disease    Gastroparesis    Heart failure with improved ejection fraction (HFimpEF) (HCC) 5+ years   Heart murmur 1961   Hemorrhoids    History of aortic valve replacement with bioprosthetic valve    History of mitral valve replacement with bioprosthetic valve    Hyperlipidemia    Hypertension    pt denies 05/30/13     Dr Alvan chester   Hyponatremia    Hypothyroidism    Rheumatic heart disease    Shortness of breath    Syncope and collapse February, 2024    Assessment: Patient Reported Symptoms: Cognitive Cognitive Status: Alert and oriented to person, place, and time      Neurological Neurological Review of Symptoms: Weakness Neurological Management Strategies: Exercise Neurological Comment: Has walker and home health therapy active  HEENT HEENT Symptoms Reported: Other: HEENT Management Strategies: Routine screening,  Medication therapy HEENT Self-Management Outcome: 4 (good) HEENT Comment: Patient with facial fracture from fall. Spouse reports bruising and lacerations are continuing to improve.  Patient has follow up with ENT next week.    Cardiovascular Cardiovascular Symptoms Reported: No symptoms reported Cardiovascular Management Strategies: Medical device Cardiovascular Comment: Patient has heart monitor on currently  Respiratory Respiratory Symptoms Reported: No symptoms reported    Endocrine Endocrine Symptoms Reported: No symptoms reported    Gastrointestinal Gastrointestinal Symptoms Reported: No symptoms reported      Genitourinary Genitourinary Symptoms Reported: Incontinence Additional Genitourinary Details: Pateint currently being treated for UTI.  Discussed signs of worsening such as increased confusion, fever or chills. Genitourinary Management Strategies: Incontinence garment/pad Genitourinary Self-Management Outcome: 3 (uncertain)  Integumentary Integumentary Symptoms Reported: Wound Additional Integumentary Details: Spouse reports wounds are to face are healing and denies any signs of infection Skin Management Strategies: Routine screening Skin Self-Management Outcome: 3 (uncertain)  Musculoskeletal Musculoskelatal Symptoms Reviewed: Unsteady gait Additional Musculoskeletal Details: Using walker independently per spouse.  PT and OT active with patient per spouse. Musculoskeletal Management Strategies: Exercise, Routine screening Musculoskeletal Self-Management Outcome: 4 (good) Falls in the past year?: Yes Number of falls in past year: 2 or more Was there an injury with Fall?: Yes Fall Risk Category Calculator: 3 Patient Fall Risk Level: High Fall Risk Patient at Risk for Falls Due to: History of fall(s), Impaired balance/gait  Psychosocial Psychosocial Symptoms Reported: Not assessed Additional Psychological Details: spouse completes assessment  There were no vitals  filed for this visit.  Medications Reviewed Today     Reviewed by Ireta Pullman, RN (Case Manager) on 07/19/24 at 1232  Med List Status: <None>   Medication Order Taking? Sig Documenting Provider Last Dose Status Informant  acetaminophen  (TYLENOL ) 325 MG tablet [id number]  Take 2 tablets (650 mg total) by mouth every 6 (six) hours as needed. [name], DO  Active Spouse/Significant Other, Pharmacy Records  albuterol  (VENTOLIN  HFA) 108 [phone] Base) MCG/ACT inhaler [id number]  Inhale 2 puffs into the lungs every 6 (six) hours as needed for wheezing or shortness of breath. [provider]  Active Spouse/Significant Other, Pharmacy Records  amiodarone  (PACERONE ) 200 MG tablet [id number]  Take 1 tablet (200 mg total) by mouth daily.  Patient taking differently: Take 200 mg by mouth in the morning.   Mealor, [name], MD  Active Spouse/Significant Other, Pharmacy Records           Med Note WORLEY, [place] S   Thu Mar 14, 2024 10:51 AM)    atorvastatin  (LIPITOR) 40 MG tablet [id number]  Take 1 tablet (40 mg total) by mouth daily. [name], MD  Active   azelastine  (ASTELIN ) 0.1 % nasal spray [id number]  Place 1 spray into both nostrils 2 (two) times daily. Use in each nostril as directed [name], NP  Active Spouse/Significant Other, Pharmacy Records  bisoprolol  (ZEBETA ) 5 MG tablet [id number]  Take 1 tablet (5 mg total) by mouth daily.  Patient taking differently: Take 5 mg by mouth in the morning.   Franchester, Cadence H, PA-C  Active Spouse/Significant Other, Pharmacy Records  budesonide -formoterol  (SYMBICORT ) 80-4.5 MCG/ACT inhaler [id number] Yes One puff first thing in am and 12 hours later  Patient taking differently: One puff first thing in am and 12 hours later. Use 1 week then swap to Trelegy for a week.   Darlean Ozell NOVAK, MD  Active Spouse/Significant Other, Pharmacy Records  calcium  carbonate (TUMS - DOSED IN MG ELEMENTAL CALCIUM ) 500 MG chewable tablet [id number] Yes  Chew 2-3 tablets by mouth as needed for indigestion or heartburn. [provider]  Active Spouse/Significant Other, Pharmacy Records  cephALEXin (KEFLEX) 500 MG capsule [id number] Yes Take 1 capsule (500 mg total) by mouth 3 (three) times daily for 7 days. Cleotilde Rogue, MD  Active   dextromethorphan  15 MG/5ML syrup [id number] Yes Take 10 mLs by mouth as needed for cough. [provider]  Active Spouse/Significant Other, Pharmacy Records  escitalopram  (LEXAPRO ) 10 MG tablet [id number] Yes Take 10 mg by mouth daily with breakfast. [provider]  Active Spouse/Significant Other, Pharmacy Records           Med Note BLASE LONELL MARLA [place] Apr 30, 2020  5:38 PM)    famotidine  (PEPCID ) 20 MG tablet [id number] Yes TAKE ONE TABLET BY MOUTH EVERY DAY AFTER SUPPER Wert, Michael B, MD  Active Spouse/Significant Other, Pharmacy Records  FARXIGA  10 MG TABS tablet [id number] Yes Take 10 mg by mouth in the morning. [provider]  Active Spouse/Significant Other, Pharmacy Records  fexofenadine (ALLEGRA) 180 MG tablet [id number] Yes Take 180 mg by mouth in the morning. [provider]  Active Spouse/Significant Other, Pharmacy Records  fluticasone  (FLONASE ) 50 MCG/ACT nasal spray [id number] Yes Place 1 spray into both nostrils in the morning. [name], MD  Active Spouse/Significant Other, Pharmacy Records  levothyroxine  (SYNTHROID ) 100 MCG tablet [id number] Yes Take 100 mcg by mouth daily before breakfast. [provider]  Active Spouse/Significant Other, Pharmacy Records  metFORMIN  (GLUCOPHAGE ) 500 MG tablet [id number] Yes Take 1 tablet (500 mg total) by mouth 2 (two) times daily with a meal. Gerome, Tillman CROME, PA-C  Active Spouse/Significant Other, Pharmacy Records  Multiple Vitamin (MULTIVITAMIN WITH MINERALS) TABS tablet [id number] Yes Take 1 tablet by mouth in the morning. [provider]  Active Spouse/Significant Other, Pharmacy Records  oxyCODONE  (OXY  IR/ROXICODONE ) 5 MG immediate release tablet [id number] Yes Take 1 tablet (5 mg total) by mouth every 6 (six) hours as needed for moderate pain (pain score 4-6). Gherghe, Costin M, MD  Active   oxymetazoline (AFRIN) 0.05 % nasal spray [id number] Yes Place 1 spray into both nostrils every 20 (twenty) minutes as needed (nose bleed.). Gherghe, Costin M, MD  Active   pantoprazole  (PROTONIX ) 40 MG tablet [id number] Yes TAKE 1 TABLET 30 TO 60 MINUTES BEFORE FIRST MEAL OF THE DAY.  Patient taking differently: Take 40 mg by mouth daily before breakfast.   Darlean Ozell NOVAK, MD  Active Spouse/Significant Other, Pharmacy Records  Polyethyl Glycol-Propyl Glycol (SYSTANE OP) [id number] Yes Place 1 drop into both eyes daily as needed (dry eyes). [provider]  Active Spouse/Significant Other, Pharmacy Records  potassium chloride  SA (KLOR-CON  M) 20 MEQ tablet [id number] Yes Take 1 tablet (20 mEq total) by mouth daily.  Patient taking differently: Take 20 mEq by mouth in the morning.   Davia Nydia POUR, MD  Active Spouse/Significant Other, Pharmacy Records  torsemide  (DEMADEX ) 20 MG tablet [id number] Yes Take 20mg  (1 tab) by mouth every other day alternating with 40mg  (2 tabs) every other day  Patient taking differently: Take 20-40 mg by mouth See admin instructions. Take 20mg  (1 tab) by mouth every other day alternating with 40mg  (2 tabs) every other day.   [name], MD  Active Spouse/Significant Other, Pharmacy Records           Med Note EFRAIM, ALFREIDA CROME   Fri Jun 28, 2024  3:49 PM) Patient took 40mg  this morning  TRELEGY ELLIPTA  100-62.5-25 MCG/ACT AEPB [id number] Yes Inhale 1 puff into the lungs daily. [provider]  Active Spouse/Significant Other, Pharmacy Records  warfarin (COUMADIN ) 2.5 MG tablet [id number] Yes TAKE AS DIRECTED BY COUMADIN  CLINIC. [name], MD  Active Spouse/Significant Other, Pharmacy Records            Goals Addressed             This  Visit's Progress    [facility] Transitions of Care (TOC) Care Plan       Problems:  Recent Hospitalization for treatment of Intraparenchymal hematoma of brain s/p syncopal episode and collapseIntraparenchymal hematoma of brain s/p syncopal episode and collapse Knowledge Deficit Related to Intraparenchymal hematoma of brain s/p syncopal episode and collapseIntraparenchymal hematoma of brain s/p syncopal episode and collapse Facial Lacerations and fracture  Goal:  Over the next 30 days, the patient will not experience hospital readmission  Interventions:  Transitions of Care: Doctor Visits  - discussed the importance of doctor visits Reviewed Signs and symptoms of infection  07/19/24 Spoke with spouse he states patient is doing some better.  She received walker from [employer] on 07/13/24 per spouse.He states she is able to get up on her own without the walker.  Recent ED visit after a fall. Patient found to have a UTI and currently on keflex.  They do have help in the home 4 hours a day/ 5 days a week,  Home health active with  patient, RN, PT and OT.  PCP visit this week.  No changes from PCP but to follow up in a couple of weeks. Discussed upcoming appointment with Dr. Tobie ENT.    Reiterated signs of infection for facial lacerations.  Advised to    Patient Self Care Activities:  Attend all scheduled provider appointments Call pharmacy for medication refills 3-7 days in advance of running out of medications Call provider office for new concerns or questions  Notify RN Care Manager of TOC call rescheduling needs Participate in Transition of Care Program/Attend TOC scheduled calls Take medications as prescribed   Notify physician for any changes or concerns.   Monitor facial lacerations for swelling, redness, pain, and pus Seek immediate medical attention for any loss of consciousness, headache that won't go away, vomiting, weakness, blurred vision, trouble staying steady.  Plan:  The patient has  been provided with contact information for the care management team and has been advised to call with any health related questions or concerns.         Recommendation:   Continue Current Plan of Care  Follow Up Plan:   Telephone follow-up in 1 week  Josel Keo J. Paige Monarrez RN, MSN [facility], [facility] Health RN Care Manager Direct Dial: [phone]  Fax: [phone] Website: [email or web address]

## 2024-07-21 ENCOUNTER — Emergency Department (HOSPITAL_COMMUNITY)

## 2024-07-21 ENCOUNTER — Observation Stay (HOSPITAL_COMMUNITY)
Admission: EM | Admit: 2024-07-21 | Discharge: 2024-07-24 | Disposition: A | Discharge status: Home / Self Care | Attending: Family Medicine | Admitting: Family Medicine

## 2024-07-21 ENCOUNTER — Other Ambulatory Visit: Payer: Self-pay

## 2024-07-21 ENCOUNTER — Encounter (HOSPITAL_COMMUNITY): Payer: Self-pay | Admitting: *Deleted

## 2024-07-21 DIAGNOSIS — Z79899 Other long term (current) drug therapy: Secondary | ICD-10-CM | POA: Insufficient documentation

## 2024-07-21 DIAGNOSIS — R55 Syncope and collapse: Secondary | ICD-10-CM | POA: Insufficient documentation

## 2024-07-21 DIAGNOSIS — R531 Weakness: Secondary | ICD-10-CM | POA: Diagnosis not present

## 2024-07-21 DIAGNOSIS — J90 Pleural effusion, not elsewhere classified: Secondary | ICD-10-CM | POA: Insufficient documentation

## 2024-07-21 DIAGNOSIS — E782 Mixed hyperlipidemia: Secondary | ICD-10-CM | POA: Diagnosis present

## 2024-07-21 DIAGNOSIS — I480 Paroxysmal atrial fibrillation: Secondary | ICD-10-CM | POA: Diagnosis not present

## 2024-07-21 DIAGNOSIS — J441 Chronic obstructive pulmonary disease with (acute) exacerbation: Secondary | ICD-10-CM

## 2024-07-21 DIAGNOSIS — F39 Unspecified mood [affective] disorder: Secondary | ICD-10-CM | POA: Diagnosis not present

## 2024-07-21 DIAGNOSIS — E039 Hypothyroidism, unspecified: Secondary | ICD-10-CM | POA: Diagnosis not present

## 2024-07-21 DIAGNOSIS — R509 Fever, unspecified: Secondary | ICD-10-CM | POA: Diagnosis not present

## 2024-07-21 DIAGNOSIS — K219 Gastro-esophageal reflux disease without esophagitis: Principal | ICD-10-CM | POA: Insufficient documentation

## 2024-07-21 DIAGNOSIS — I2510 Atherosclerotic heart disease of native coronary artery without angina pectoris: Secondary | ICD-10-CM | POA: Insufficient documentation

## 2024-07-21 DIAGNOSIS — I5032 Chronic diastolic (congestive) heart failure: Secondary | ICD-10-CM | POA: Diagnosis not present

## 2024-07-21 DIAGNOSIS — R7989 Other specified abnormal findings of blood chemistry: Secondary | ICD-10-CM | POA: Insufficient documentation

## 2024-07-21 DIAGNOSIS — R791 Abnormal coagulation profile: Secondary | ICD-10-CM | POA: Insufficient documentation

## 2024-07-21 DIAGNOSIS — I5031 Acute diastolic (congestive) heart failure: Secondary | ICD-10-CM

## 2024-07-21 DIAGNOSIS — R2681 Unsteadiness on feet: Secondary | ICD-10-CM | POA: Insufficient documentation

## 2024-07-21 DIAGNOSIS — Z952 Presence of prosthetic heart valve: Secondary | ICD-10-CM

## 2024-07-21 DIAGNOSIS — R590 Localized enlarged lymph nodes: Secondary | ICD-10-CM | POA: Insufficient documentation

## 2024-07-21 DIAGNOSIS — I10 Essential (primary) hypertension: Secondary | ICD-10-CM | POA: Diagnosis present

## 2024-07-21 DIAGNOSIS — I619 Nontraumatic intracerebral hemorrhage, unspecified: Secondary | ICD-10-CM | POA: Diagnosis not present

## 2024-07-21 DIAGNOSIS — N1832 Chronic kidney disease, stage 3b: Secondary | ICD-10-CM | POA: Diagnosis not present

## 2024-07-21 DIAGNOSIS — S0633AA Contusion and laceration of cerebrum, unspecified, with loss of consciousness status unknown, initial encounter: Secondary | ICD-10-CM | POA: Diagnosis present

## 2024-07-21 DIAGNOSIS — R0989 Other specified symptoms and signs involving the circulatory and respiratory systems: Secondary | ICD-10-CM | POA: Diagnosis not present

## 2024-07-21 DIAGNOSIS — I130 Hypertensive heart and chronic kidney disease with heart failure and stage 1 through stage 4 chronic kidney disease, or unspecified chronic kidney disease: Secondary | ICD-10-CM | POA: Insufficient documentation

## 2024-07-21 DIAGNOSIS — R058 Other specified cough: Secondary | ICD-10-CM

## 2024-07-21 DIAGNOSIS — R9431 Abnormal electrocardiogram [ECG] [EKG]: Secondary | ICD-10-CM

## 2024-07-21 LAB — COMPREHENSIVE METABOLIC PANEL WITH GFR
ALT: 18 U/L (ref 0–44)
AST: 28 U/L (ref 15–41)
Albumin: 4 g/dL (ref 3.5–5.0)
Alkaline Phosphatase: 69 U/L (ref 38–126)
Anion gap: 12 (ref 5–15)
BUN: 20 mg/dL (ref 8–23)
CO2: 28 mmol/L (ref 22–32)
Calcium: 8.9 mg/dL (ref 8.9–10.3)
Chloride: 97 mmol/L — ABNORMAL LOW (ref 98–111)
Creatinine, Ser: 1.58 mg/dL — ABNORMAL HIGH (ref 0.44–1.00)
GFR, Estimated: 33 mL/min — ABNORMAL LOW (ref >60)
Glucose, Bld: 113 mg/dL — ABNORMAL HIGH (ref 70–99)
Potassium: 3.7 mmol/L (ref 3.5–5.1)
Sodium: 137 mmol/L (ref 135–145)
Total Bilirubin: 0.4 mg/dL (ref 0.0–1.2)
Total Protein: 7.1 g/dL (ref 6.5–8.1)

## 2024-07-21 LAB — URINALYSIS, ROUTINE W REFLEX MICROSCOPIC
Bilirubin Urine: NEGATIVE
Glucose, UA: 150 mg/dL — AB
Hgb urine dipstick: NEGATIVE
Ketones, ur: NEGATIVE mg/dL
Leukocytes,Ua: NEGATIVE
Nitrite: NEGATIVE
Protein, ur: NEGATIVE mg/dL
Specific Gravity, Urine: 1.003 — ABNORMAL LOW (ref 1.005–1.030)
pH: 6 (ref 5.0–8.0)

## 2024-07-21 LAB — BLOOD GAS, VENOUS
Acid-Base Excess: 6.9 mmol/L — ABNORMAL HIGH (ref 0.0–2.0)
Bicarbonate: 30.3 mmol/L — ABNORMAL HIGH (ref 20.0–28.0)
Drawn by: 10119
O2 Saturation: 37 %
Patient temperature: 36.5
pCO2, Ven: 37 mmHg — ABNORMAL LOW (ref 44–60)
pH, Ven: 7.52 — ABNORMAL HIGH (ref 7.25–7.43)
pO2, Ven: <31 mmHg — CL (ref 32–45)

## 2024-07-21 LAB — CBC WITH DIFFERENTIAL/PLATELET
Abs Immature Granulocytes: 0.02 K/uL (ref 0.00–0.07)
Basophils Absolute: 0.1 K/uL (ref 0.0–0.1)
Basophils Relative: 1 %
Eosinophils Absolute: 0.2 K/uL (ref 0.0–0.5)
Eosinophils Relative: 3 %
HCT: 37.9 % (ref 36.0–46.0)
Hemoglobin: 11.5 g/dL — ABNORMAL LOW (ref 12.0–15.0)
Immature Granulocytes: 0 %
Lymphocytes Relative: 18 %
Lymphs Abs: 1 K/uL (ref 0.7–4.0)
MCH: 26.7 pg (ref 26.0–34.0)
MCHC: 30.3 g/dL (ref 30.0–36.0)
MCV: 88.1 fL (ref 80.0–100.0)
Monocytes Absolute: 0.6 K/uL (ref 0.1–1.0)
Monocytes Relative: 11 %
Neutro Abs: 3.5 K/uL (ref 1.7–7.7)
Neutrophils Relative %: 67 %
Platelets: 304 K/uL (ref 150–400)
RBC: 4.3 MIL/uL (ref 3.87–5.11)
RDW: 17.4 % — ABNORMAL HIGH (ref 11.5–15.5)
WBC: 5.3 K/uL (ref 4.0–10.5)
nRBC: 0 % (ref 0.0–0.2)

## 2024-07-21 LAB — D-DIMER, QUANTITATIVE: D-Dimer, Quant: 2.15 ug{FEU}/mL — ABNORMAL HIGH (ref 0.00–0.50)

## 2024-07-21 LAB — RESP PANEL BY RT-PCR (RSV, FLU A&B, COVID)  RVPGX2
Influenza A by PCR: NEGATIVE
Influenza B by PCR: NEGATIVE
Resp Syncytial Virus by PCR: NEGATIVE
SARS Coronavirus 2 by RT PCR: NEGATIVE

## 2024-07-21 LAB — GLUCOSE, CAPILLARY: Glucose-Capillary: 102 mg/dL — ABNORMAL HIGH (ref 70–99)

## 2024-07-21 LAB — PROTIME-INR
INR: 3.7 — ABNORMAL HIGH (ref 0.8–1.2)
Prothrombin Time: 38.6 s — ABNORMAL HIGH (ref 11.4–15.2)

## 2024-07-21 LAB — LIPASE, BLOOD: Lipase: 46 U/L (ref 11–51)

## 2024-07-21 MED ORDER — ACETAMINOPHEN 325 MG PO TABS
650.0000 mg | ORAL_TABLET | Freq: Four times a day (QID) | ORAL | Status: DC | PRN
  Administered 2024-07-22 – 2024-07-23 (×3): 650 mg via ORAL
  Filled 2024-07-21 (×3): qty 2

## 2024-07-21 MED ORDER — ENSURE PLUS HIGH PROTEIN PO LIQD
237.0000 mL | Freq: Two times a day (BID) | ORAL | Status: DC
  Administered 2024-07-22 – 2024-07-23 (×2): 237 mL via ORAL

## 2024-07-21 MED ORDER — ACETAMINOPHEN 650 MG RE SUPP: 650.0000 mg | Freq: Four times a day (QID) | RECTAL | Status: DC | PRN

## 2024-07-21 MED ORDER — ONDANSETRON HCL 4 MG PO TABS: 4.0000 mg | ORAL_TABLET | Freq: Four times a day (QID) | ORAL | Status: DC | PRN

## 2024-07-21 MED ORDER — ONDANSETRON HCL 4 MG/2ML IJ SOLN: 4.0000 mg | Freq: Four times a day (QID) | INTRAMUSCULAR | Status: DC | PRN

## 2024-07-21 MED ORDER — IOHEXOL 350 MG/ML SOLN
60.0000 mL | Freq: Once | INTRAVENOUS | Status: AC | PRN
Start: 2024-07-21 — End: 2024-07-21
  Administered 2024-07-21: 60 mL via INTRAVENOUS

## 2024-07-21 MED ORDER — CALCIUM CARBONATE ANTACID 500 MG PO CHEW
1.0000 | CHEWABLE_TABLET | Freq: Once | ORAL | Status: AC
  Administered 2024-07-21: 200 mg via ORAL
  Filled 2024-07-21: qty 1

## 2024-07-21 NOTE — Plan of Care (Signed)

## 2024-07-21 NOTE — H&P (Incomplete)
 History and Physical    Patient: [name] FMW:[id number] DOB: 1946/10/02 DOA: 07/21/2024 DOS: the patient was seen and examined on 07/21/2024 PCP: Sheryle Carwin, MD  Patient coming from: Home  Chief Complaint:  Chief Complaint  Patient presents with  . Fever   HPI: [name] is a 78 y.o. female with medical history significant of hypothyroidism, type 2 diabetes mellitus, COPD, HFpEF, A-fib/flutter on warfarin, rheumatic heart disease, AV and MV replacement, CVA, carotid artery stenosis, CKD-3B, lumbar laminectomy and compression and mild cognitive impairment who presents to the ED emergency department due to 4-day onset of worsening generalized weakness, subjective fever, nausea, diarrhea.  Patient usually ambulates within the house and without the need of any assistive device, but she has been requiring a rollator whereby she sits on it instead of walking with it due to weakness. Husband reported a syncopal episode while in the ED  She was admitted to [facility] from 9/26 to 10/8 due to syncope and collapse in which she sustained a head trauma and was found to have intraparenchymal hemorrhage (she is on warfarin).  Repeated MRI reviewed by neurosurgery at that time was stable and there was no need for surgical intervention.  She was seen in the ED on 10/11 due to UTI and was discharged home with antibiotics.  She was doing fairly until she started to complain of weakness as described above.  ED Course:  In the emergency department, she was hemodynamically stable.  Workup in the ED showed normocytic anemia.  BMP was normal except for chloride of 97, glucose 113, creatinine 1.58.  D-dimer 2.15, urinalysis was normal.  Influenza A, B, SARS coronavirus 2, RSV was negative. CT angiography of chest showed no pulmonary embolism. Chronic bilateral pleural effusions calcified plaques suggestive of prior asbestos exposure. Indeterminate chronic stable right hilar, left hilar, and prevascular  mediastinal lymphadenopathy. Chest x-ray shows stable chest.  No acute process Patient was treated with calcium  carbonate.  TRH was asked to admit patient.   Review of Systems: Review of systems as noted in the HPI. All other systems reviewed and are negative.   Past Medical History:  Diagnosis Date  . Anemia   . Asthma 2010  . Atrial fibrillation (HCC)   . Atrial flutter (HCC)   . Breast carcinoma (HCC) 1995   1995  . Cardioembolic stroke (HCC) 01/2012   Right frontal in 01/2012; normal carotid ultrasound; possible LAA thrombus by TEE; virtual complete neurologic recovery  . Carotid artery stenosis 1961  . Chronic kidney disease, stage 2, mildly decreased GFR    GFR of approximately 60  . Diabetes mellitus without complication (HCC) 5+ years ago   Controlled most of time  . Diverticulosis of colon (without mention of hemorrhage) 2012   Dr. Golda  . Fasting hyperglycemia    120 fasting  . Gastroesophageal reflux disease   . Gastroparesis   . Heart failure with improved ejection fraction (HFimpEF) (HCC) 5+ years  . Heart murmur 1961  . Hemorrhoids   . History of aortic valve replacement with bioprosthetic valve   . History of mitral valve replacement with bioprosthetic valve   . Hyperlipidemia   . Hypertension    pt denies 05/30/13     Dr Alvan chester  . Hyponatremia   . Hypothyroidism   . Rheumatic heart disease   . Shortness of breath   . Syncope and collapse February, 2024   Past Surgical History:  Procedure Laterality Date  . AORTIC VALVE  REPLACEMENT N/A 07/18/2013   Procedure: AORTIC VALVE REPLACEMENT (AVR);  Surgeon: Dorise MARLA Fellers, MD;  Location: [facility] OR;  Service: Open Heart Surgery;  Laterality: N/A;  . BACK SURGERY    . BREAST LUMPECTOMY Right 10/03/1993  . CARDIAC CATHETERIZATION  5+ years  . CARDIOVERSION N/A 07/26/2013   Procedure: CARDIOVERSION;  Surgeon: Vina LULLA Gull, MD;  Location: [facility] ENDOSCOPY;  Service: Cardiovascular;  Laterality: N/A;  .  CARDIOVERSION N/A 01/04/2023   Procedure: CARDIOVERSION;  Surgeon: Hobart Powell BRAVO, MD;  Location: [facility] ENDOSCOPY;  Service: Cardiovascular;  Laterality: N/A;  . CARDIOVERSION N/A 02/23/2024   Procedure: CARDIOVERSION;  Surgeon: [name], MD;  Location: [facility] ORS;  Service: Endoscopy;  Laterality: N/A;  . COLONOSCOPY  10/03/2010   Negative screening procedure  . DILATION AND CURETTAGE OF UTERUS    . ESOPHAGEAL MANOMETRY N/A 06/17/2013   Procedure: ESOPHAGEAL MANOMETRY (EM);  Surgeon: Alm JONELLE Gander, MD;  Location: [facility] ENDOSCOPY;  Service: Endoscopy;  Laterality: N/A;  . INTRAOPERATIVE TRANSESOPHAGEAL ECHOCARDIOGRAM N/A 07/18/2013   Procedure: INTRAOPERATIVE TRANSESOPHAGEAL ECHOCARDIOGRAM;  Surgeon: Dorise MARLA Fellers, MD;  Location: [facility] OR;  Service: Open Heart Surgery;  Laterality: N/A;  . LEFT AND RIGHT HEART CATHETERIZATION WITH CORONARY ANGIOGRAM N/A 07/10/2013   Procedure: LEFT AND RIGHT HEART CATHETERIZATION WITH CORONARY ANGIOGRAM;  Surgeon: Lonni JONETTA Cash, MD;  Location: [facility] CATH LAB;  Service: Cardiovascular;  Laterality: N/A;  . LOOP RECORDER INSERTION N/A 07/02/2024   Procedure: LOOP RECORDER INSERTION;  Surgeon: Nancey Eulas BRAVO, MD;  Location: [facility] INVASIVE CV LAB;  Service: Cardiovascular;  Laterality: N/A;  . LUMBAR LAMINECTOMY/DECOMPRESSION MICRODISCECTOMY Right 05/13/2014   Procedure: LUMBAR LAMINECTOMY/DECOMPRESSION MICRODISCECTOMY 1 LEVEL  lumbar four/five;  Surgeon: Darina MALVA Boehringer, MD;  Location: [facility] NEURO ORS;  Service: Neurosurgery;  Laterality: Right;  . MITRAL VALVE REPLACEMENT N/A 07/18/2013   Procedure: MITRAL VALVE (MV) REPLACEMENT;  Surgeon: Dorise MARLA Fellers, MD;  Location: [facility] OR;  Service: Open Heart Surgery;  Laterality: N/A;  . MITRAL VALVE SURGERY  10/03/1960   [facility], closed mitral valvulotomy by finger fracture  . TEE WITHOUT CARDIOVERSION  01/24/2012   Procedure: TRANSESOPHAGEAL ECHOCARDIOGRAM (TEE);  Surgeon: Redell GORMAN Shallow, MD;  Location: [facility]  ENDOSCOPY;  Service: Cardiovascular;  Laterality: N/A;  . TEE WITHOUT CARDIOVERSION N/A 07/11/2013   Procedure: TRANSESOPHAGEAL ECHOCARDIOGRAM (TEE);  Surgeon: Aleene JINNY Passe, MD;  Location: [facility] ENDOSCOPY;  Service: Cardiovascular;  Laterality: N/A;  . TEE WITHOUT CARDIOVERSION N/A 07/26/2013   Procedure: TRANSESOPHAGEAL ECHOCARDIOGRAM (TEE);  Surgeon: Vina LULLA Gull, MD;  Location: [facility] ENDOSCOPY;  Service: Cardiovascular;  Laterality: N/A;  . TUBAL LIGATION  10/04/1971    Social History:  reports that she has never smoked. She has never used smokeless tobacco. She reports that she does not drink alcohol and does not use drugs.   No Known Allergies  Family History  Problem Relation Age of Onset  . Heart disease Brother 50       MI  . Rheum arthritis Maternal Grandmother   . Asthma Maternal Grandfather   . Hypothyroidism Mother   . Diabetes Sister   . Cirrhosis Father     ***  Prior to Admission medications   Medication Sig Start Date End Date Taking? Authorizing Provider  acetaminophen  (TYLENOL ) 325 MG tablet Take 2 tablets (650 mg total) by mouth every 6 (six) hours as needed. 01/20/24   [name], DO  albuterol  (VENTOLIN  HFA) 108 (90 Base) MCG/ACT inhaler Inhale 2 puffs into the lungs every 6 (six)  hours as needed for wheezing or shortness of breath. 08/17/23   [provider]  amiodarone  (PACERONE ) 200 MG tablet Take 1 tablet (200 mg total) by mouth daily. Patient taking differently: Take 200 mg by mouth in the morning. 02/02/24   Mealor, [name], MD  atorvastatin  (LIPITOR) 40 MG tablet Take 1 tablet (40 mg total) by mouth daily. 07/05/24   [name], MD  azelastine  (ASTELIN ) 0.1 % nasal spray Place 1 spray into both nostrils 2 (two) times daily. Use in each nostril as directed 04/30/24   [name], NP  bisoprolol  (ZEBETA ) 5 MG tablet Take 1 tablet (5 mg total) by mouth daily. Patient taking differently: Take 5 mg by mouth in the morning. 02/23/24    Furth, Cadence H, PA-C  budesonide -formoterol  (SYMBICORT ) 80-4.5 MCG/ACT inhaler One puff first thing in am and 12 hours later Patient taking differently: One puff first thing in am and 12 hours later. Use 1 week then swap to Trelegy for a week. 05/13/24   Darlean Ozell NOVAK, MD  calcium  carbonate (TUMS - DOSED IN MG ELEMENTAL CALCIUM ) 500 MG chewable tablet Chew 2-3 tablets by mouth as needed for indigestion or heartburn.    [provider]  dextromethorphan  15 MG/5ML syrup Take 10 mLs by mouth as needed for cough.    [provider]  escitalopram  (LEXAPRO ) 10 MG tablet Take 10 mg by mouth daily with breakfast. 04/10/15   [provider]  famotidine  (PEPCID ) 20 MG tablet TAKE ONE TABLET BY MOUTH EVERY DAY AFTER SUPPER 12/25/23   Wert, Michael B, MD  FARXIGA  10 MG TABS tablet Take 10 mg by mouth in the morning. 03/28/23   [provider]  fexofenadine (ALLEGRA) 180 MG tablet Take 180 mg by mouth in the morning.    [provider]  fluticasone  (FLONASE ) 50 MCG/ACT nasal spray Place 1 spray into both nostrils in the morning. 09/05/13   [name], MD  levothyroxine  (SYNTHROID ) 100 MCG tablet Take 100 mcg by mouth daily before breakfast.    [provider]  metFORMIN  (GLUCOPHAGE ) 500 MG tablet Take 1 tablet (500 mg total) by mouth 2 (two) times daily with a meal. 08/03/13   Collins, Tillman CROME, PA-C  Multiple Vitamin (MULTIVITAMIN WITH MINERALS) TABS tablet Take 1 tablet by mouth in the morning.    [provider]  oxyCODONE  (OXY IR/ROXICODONE ) 5 MG immediate release tablet Take 1 tablet (5 mg total) by mouth every 6 (six) hours as needed for moderate pain (pain score 4-6). 07/10/24   Gherghe, Costin M, MD  oxymetazoline (AFRIN) 0.05 % nasal spray Place 1 spray into both nostrils every 20 (twenty) minutes as needed (nose bleed.). 07/10/24   Gherghe, Costin M, MD  pantoprazole  (PROTONIX ) 40 MG tablet TAKE 1 TABLET 30 TO 60 MINUTES BEFORE FIRST MEAL OF  THE DAY. Patient taking differently: Take 40 mg by mouth daily before breakfast. 03/15/24   Darlean Ozell NOVAK, MD  Polyethyl Glycol-Propyl Glycol (SYSTANE OP) Place 1 drop into both eyes daily as needed (dry eyes).    [provider]  potassium chloride  SA (KLOR-CON  M) 20 MEQ tablet Take 1 tablet (20 mEq total) by mouth daily. Patient taking differently: Take 20 mEq by mouth in the morning. 11/14/22 07/19/24  Rai, Nydia POUR, MD  torsemide  (DEMADEX ) 20 MG tablet Take 20mg  (1 tab) by mouth every other day alternating with 40mg  (2 tabs) every other day Patient taking differently: Take 20-40 mg by mouth See admin  instructions. Take 20mg  (1 tab) by mouth every other day alternating with 40mg  (2 tabs) every other day. 04/12/24   [name], MD  TRELEGY ELLIPTA  100-62.5-25 MCG/ACT AEPB Inhale 1 puff into the lungs daily. 06/25/24   [provider]  warfarin (COUMADIN ) 2.5 MG tablet TAKE AS DIRECTED BY COUMADIN  CLINIC. 05/13/24   [name], MD    Physical Exam: BP (!) 121/33 (BP Location: Left Arm)   Pulse (!) 59   Temp 97.8 F (36.6 C) (Oral)   Resp (!) 30   Wt 67.2 kg   SpO2 100%   BMI 26.24 kg/m   General: 78 y.o. year-old female ill appearing, but in no acute distress.  Alert and oriented x3. HEENT: NCAT, EOMI Neck: Supple, trachea medial Cardiovascular: Regular rate and rhythm with no rubs or gallops.  No thyromegaly or JVD noted.  No lower extremity edema. 2/4 pulses in all 4 extremities. Respiratory: Clear to auscultation with no wheezes or rales. Good inspiratory effort. Abdomen: Soft, nontender nondistended with normal bowel sounds x4 quadrants. Muskuloskeletal: No cyanosis, clubbing or edema noted bilaterally Neuro: No focal neurologic deficit.  Sensation, reflexes intact Skin: No ulcerative lesions noted or rashes Psychiatry: Mood is appropriate for condition and setting          Labs on Admission:  Basic Metabolic Panel: Recent Labs  Lab  07/21/24 1702  NA 137  K 3.7  CL 97*  CO2 28  GLUCOSE 113*  BUN 20  CREATININE 1.58*  CALCIUM  8.9   Liver Function Tests: Recent Labs  Lab 07/21/24 1702  AST 28  ALT 18  ALKPHOS 69  BILITOT 0.4  PROT 7.1  ALBUMIN  4.0   Recent Labs  Lab 07/21/24 1702  LIPASE 46   No results for input(s): AMMONIA in the last 168 hours. CBC: Recent Labs  Lab 07/21/24 1702  WBC 5.3  NEUTROABS 3.5  HGB 11.5*  HCT 37.9  MCV 88.1  PLT 304   Cardiac Enzymes: No results for input(s): CKTOTAL, CKMB, CKMBINDEX, TROPONINI in the last 168 hours.  BNP (last 3 results) Recent Labs    01/25/24 2031 03/14/24 1016 05/30/24 0727  BNP 775.0* 503.0* 528.0*    ProBNP (last 3 results) No results for input(s): PROBNP in the last 8760 hours.  CBG: Recent Labs  Lab 07/21/24 2159  GLUCAP 102*    Radiological Exams on Admission: CT Angio Chest PE W and/or Wo Contrast Result Date: 07/21/2024 EXAM: CTA CHEST 07/21/2024 07:16:17 PM TECHNIQUE: CTA of the chest was performed after the administration of intravenous contrast. Multiplanar reformatted images are provided for review. MIP images are provided for review. Automated exposure control, iterative reconstruction, and/or weight based adjustment of the mA/kV was utilized to reduce the radiation dose to as low as reasonably achievable. COMPARISON: CT angiography chest 08:30 11/10/2020 CLINICAL HISTORY: Pulmonary embolism (PE) suspected, low to intermediate prob, positive D-dimer. Table formatting from the original note was not included.; BIB family from home for subjective fever, nausea, non-productive cough, diarrhea and body aches. Rates pain 4/10. Denies other sx. No meds PTA. Steady gait. Alert, NAD, calm, interactive, resps e/u, speaking in clear complete sentences. FINDINGS: PULMONARY ARTERIES: Pulmonary arteries are adequately opacified for evaluation. No acute pulmonary embolus. Main pulmonary artery is normal in caliber.  MEDIASTINUM: Aortic and mitral annular valve replacements. Prominent heart size. 4-vessel coronary artery calcification. Severe atherosclerotic plaque of the thoracic aorta. The pericardium demonstrates no acute abnormality. LYMPH NODES: Chronic stable Right hilar lymphadenopathy measuring up  to 1.1 cm. Chronic stable Left hilar lymphadenopathy measuring up to 1.2 cm. Chronic stable Prevascular mediastinal lymphadenopathy measuring up to 1 cm. No axillary lymphadenopathy. LUNGS AND PLEURA: Biapical pleural/pulmonary scarring. Pleural calcifications noted bilaterally. Chronic stable Subpleural left lower lobe pulmonary micronodule (series 6, image 57) -no further follow-up indicated . Subpleural triangular micronodule along the right minor fissure likely an intrapulmonary lymph node no further follow-up indicated . Reticulations along the right anterior upper lobe likely sequelae of prior radiation therapy. No evidence of pleural effusion or pneumothorax. UPPER ABDOMEN: Colonic diverticulosis. SOFT TISSUES AND BONES: Right breast surgical changes suggestive of lumpectomy. No acute bone or soft tissue abnormality. IMPRESSION: 1. No pulmonary embolism. 2. Chronic bilateral pleural effusions calcified plaques suggestive of prior asbestos exposure. 3. Indeterminate chronic stable right hilar, left hilar, and prevascular mediastinal lymphadenopathy. 4. Severe atherosclerotic plaque of the thoracic aorta and 4-vessel coronary artery calcification. Electronically signed by: Morgane Naveau MD 07/21/2024 07:32 PM EDT RP Workstation: [id number]   DG Chest 2 View Result Date: 07/21/2024 CLINICAL DATA:  Short of breath, fever EXAM: CHEST - 2 VIEW COMPARISON:  06/30/2024 FINDINGS: Frontal and lateral views of the chest demonstrates stable postsurgical changes from median sternotomy and mitral and aortic valve replacement. The cardiac silhouette is enlarged but stable. Chronic central vascular congestion without airspace  disease, effusion, or pneumothorax. No acute bony abnormalities. IMPRESSION: 1. Stable chest, no acute process. Electronically Signed   By: Ozell Daring M.D.   On: 07/21/2024 17:50    EKG: I independently viewed the EKG done and my findings are as followed: Sinus or ectopic atrial rhythm at a rate of 64 bpm and prolonged PR interval (347 ms) with QTc of 524 ms  Assessment/Plan Present on Admission: . Syncope  Active Problems:   Syncope  Syncope Continue telemetry and watch for arrhythmias Troponins pending. *** Echocardiogram done on 06/29/2024 showed EF of 77 5%.  No WMA.  LV diastolic parameters indeterminate. Echocardiogram will be done to rule out significant aortic stenosis or other outflow obstruction, and also to evaluate EF and to rule out segmental/Regional wall motion abnormalities.  Carotid artery Dopplers will be done to rule out hemodynamically significant stenosis  Generalized weakness Protein supplement will be provided Continue fall precaution Continue PT/OT eval and treat  Supratherapeutic INR INR 3.7, warfarin will be held at this time  Elevated D-dimer D-dimer 2.15 CT angiograph of chest ruled out pulmonary embolism  CKD 3B Creatinine 1.58 (creatinine is within baseline range) Renally adjust medications, avoid nephrotoxic agents/dehydration/hypotension  History of left intraparenchymal hemorrhage/SAH Stable. INR elevated at 3.7,, this will be held at this time  Chronic HFrEF Continue total input/output, daily weights and fluid restriction Continue heart healthy diet  Continue home torsemide , Farxiga  and losartan   TTE done on 06/29/2024 showed EF of 63%.  No RWMA.  Indeterminate LV diastolic parameters.  Paroxysmal A-fib/flutter Continue amiodarone  Bisoprolol  will be temporarily held due to prolonged PR interval (347 ms) Warfarin will be held at this time due to supratherapeutic INR  Essential hypertension (controlled) Continue home Farxiga ,  losartan  and torsemide    Acquired hypothyroidism Continue home Synthroid    GERD Continue PPI   Mood disorder Continue Lexapro .   DVT prophylaxis: ***   Code Status: ***   Family Communication: ***   Disposition Plan: ***   Consults called: ***   Admission status: ***     Posey Maier MD [facility] Pager [phone]  If 7PM-7AM, please contact night-coverage [email or web address] Password [email or web address]  07/21/2024, 11:10 PM  Review of Systems: {ROS_Text:[id number]} Past Medical History:  Diagnosis Date  . Anemia   . Asthma 2010  . Atrial fibrillation (HCC)   . Atrial flutter (HCC)   . Breast carcinoma (HCC) 1995   1995  . Cardioembolic stroke (HCC) 01/2012   Right frontal in 01/2012; normal carotid ultrasound; possible LAA thrombus by TEE; virtual complete neurologic recovery  . Carotid artery stenosis 1961  . Chronic kidney disease, stage 2, mildly decreased GFR    GFR of approximately 60  . Diabetes mellitus without complication (HCC) 5+ years ago   Controlled most of time  . Diverticulosis of colon (without mention of hemorrhage) 2012   Dr. Golda  . Fasting hyperglycemia    120 fasting  . Gastroesophageal reflux disease   . Gastroparesis   . Heart failure with improved ejection fraction (HFimpEF) (HCC) 5+ years  . Heart murmur 1961  . Hemorrhoids   . History of aortic valve replacement with bioprosthetic valve   . History of mitral valve replacement with bioprosthetic valve   . Hyperlipidemia   . Hypertension    pt denies 05/30/13     Dr Alvan chester  . Hyponatremia   . Hypothyroidism   . Rheumatic heart disease   . Shortness of breath   . Syncope and collapse February, 2024   Past Surgical History:  Procedure Laterality Date  . AORTIC VALVE REPLACEMENT N/A 07/18/2013   Procedure: AORTIC VALVE REPLACEMENT (AVR);  Surgeon: Dorise MARLA Fellers, MD;  Location: [facility] OR;  Service: Open Heart Surgery;  Laterality: N/A;  . BACK SURGERY    . BREAST  LUMPECTOMY Right 10/03/1993  . CARDIAC CATHETERIZATION  5+ years  . CARDIOVERSION N/A 07/26/2013   Procedure: CARDIOVERSION;  Surgeon: Vina LULLA Gull, MD;  Location: [facility] ENDOSCOPY;  Service: Cardiovascular;  Laterality: N/A;  . CARDIOVERSION N/A 01/04/2023   Procedure: CARDIOVERSION;  Surgeon: Hobart Powell BRAVO, MD;  Location: [facility] ENDOSCOPY;  Service: Cardiovascular;  Laterality: N/A;  . CARDIOVERSION N/A 02/23/2024   Procedure: CARDIOVERSION;  Surgeon: [name], MD;  Location: [facility] ORS;  Service: Endoscopy;  Laterality: N/A;  . COLONOSCOPY  10/03/2010   Negative screening procedure  . DILATION AND CURETTAGE OF UTERUS    . ESOPHAGEAL MANOMETRY N/A 06/17/2013   Procedure: ESOPHAGEAL MANOMETRY (EM);  Surgeon: Alm JONELLE Gander, MD;  Location: [facility] ENDOSCOPY;  Service: Endoscopy;  Laterality: N/A;  . INTRAOPERATIVE TRANSESOPHAGEAL ECHOCARDIOGRAM N/A 07/18/2013   Procedure: INTRAOPERATIVE TRANSESOPHAGEAL ECHOCARDIOGRAM;  Surgeon: Dorise MARLA Fellers, MD;  Location: [facility] OR;  Service: Open Heart Surgery;  Laterality: N/A;  . LEFT AND RIGHT HEART CATHETERIZATION WITH CORONARY ANGIOGRAM N/A 07/10/2013   Procedure: LEFT AND RIGHT HEART CATHETERIZATION WITH CORONARY ANGIOGRAM;  Surgeon: Lonni JONETTA Cash, MD;  Location: [facility] CATH LAB;  Service: Cardiovascular;  Laterality: N/A;  . LOOP RECORDER INSERTION N/A 07/02/2024   Procedure: LOOP RECORDER INSERTION;  Surgeon: Nancey Eulas BRAVO, MD;  Location: [facility] INVASIVE CV LAB;  Service: Cardiovascular;  Laterality: N/A;  . LUMBAR LAMINECTOMY/DECOMPRESSION MICRODISCECTOMY Right 05/13/2014   Procedure: LUMBAR LAMINECTOMY/DECOMPRESSION MICRODISCECTOMY 1 LEVEL  lumbar four/five;  Surgeon: Darina MALVA Boehringer, MD;  Location: [facility] NEURO ORS;  Service: Neurosurgery;  Laterality: Right;  . MITRAL VALVE REPLACEMENT N/A 07/18/2013   Procedure: MITRAL VALVE (MV) REPLACEMENT;  Surgeon: Dorise MARLA Fellers, MD;  Location: [facility] OR;  Service: Open Heart Surgery;  Laterality: N/A;  .  MITRAL VALVE SURGERY  10/03/1960   [facility], closed mitral valvulotomy by finger fracture  . TEE WITHOUT CARDIOVERSION  01/24/2012   Procedure: TRANSESOPHAGEAL ECHOCARDIOGRAM (TEE);  Surgeon: Redell GORMAN Shallow, MD;  Location: [facility] ENDOSCOPY;  Service: Cardiovascular;  Laterality: N/A;  . TEE WITHOUT CARDIOVERSION N/A 07/11/2013   Procedure: TRANSESOPHAGEAL ECHOCARDIOGRAM (TEE);  Surgeon: Aleene JINNY Passe, MD;  Location: [facility] ENDOSCOPY;  Service: Cardiovascular;  Laterality: N/A;  . TEE WITHOUT CARDIOVERSION N/A 07/26/2013   Procedure: TRANSESOPHAGEAL ECHOCARDIOGRAM (TEE);  Surgeon: Vina LULLA Gull, MD;  Location: [facility] ENDOSCOPY;  Service: Cardiovascular;  Laterality: N/A;  . TUBAL LIGATION  10/04/1971   Social History:  reports that she has never smoked. She has never used smokeless tobacco. She reports that she does not drink alcohol and does not use drugs.  No Known Allergies  Family History  Problem Relation Age of Onset  . Heart disease Brother 50       MI  . Rheum arthritis Maternal Grandmother   . Asthma Maternal Grandfather   . Hypothyroidism Mother   . Diabetes Sister   . Cirrhosis Father     Prior to Admission medications   Medication Sig Start Date End Date Taking? Authorizing Provider  acetaminophen  (TYLENOL ) 325 MG tablet Take 2 tablets (650 mg total) by mouth every 6 (six) hours as needed. 01/20/24   [name], DO  albuterol  (VENTOLIN  HFA) 108 (90 Base) MCG/ACT inhaler Inhale 2 puffs into the lungs every 6 (six) hours as needed for wheezing or shortness of breath. 08/17/23   [provider]  amiodarone  (PACERONE ) 200 MG tablet Take 1 tablet (200 mg total) by mouth daily. Patient taking differently: Take 200 mg by mouth in the morning. 02/02/24   Mealor, [name], MD  atorvastatin  (LIPITOR) 40 MG tablet Take 1 tablet (40 mg total) by mouth daily. 07/05/24   [name], MD  azelastine  (ASTELIN ) 0.1 % nasal spray Place 1 spray into both nostrils 2 (two) times daily.  Use in each nostril as directed 04/30/24   [name], NP  bisoprolol  (ZEBETA ) 5 MG tablet Take 1 tablet (5 mg total) by mouth daily. Patient taking differently: Take 5 mg by mouth in the morning. 02/23/24   Furth, Cadence H, PA-C  budesonide -formoterol  (SYMBICORT ) 80-4.5 MCG/ACT inhaler One puff first thing in am and 12 hours later Patient taking differently: One puff first thing in am and 12 hours later. Use 1 week then swap to Trelegy for a week. 05/13/24   Darlean Ozell NOVAK, MD  calcium  carbonate (TUMS - DOSED IN MG ELEMENTAL CALCIUM ) 500 MG chewable tablet Chew 2-3 tablets by mouth as needed for indigestion or heartburn.    [provider]  dextromethorphan  15 MG/5ML syrup Take 10 mLs by mouth as needed for cough.    [provider]  escitalopram  (LEXAPRO ) 10 MG tablet Take 10 mg by mouth daily with breakfast. 04/10/15   [provider]  famotidine  (PEPCID ) 20 MG tablet TAKE ONE TABLET BY MOUTH EVERY DAY AFTER SUPPER 12/25/23   Wert, Michael B, MD  FARXIGA  10 MG TABS tablet Take 10 mg by mouth in the morning. 03/28/23   [provider]  fexofenadine (ALLEGRA) 180 MG tablet Take 180 mg by mouth in the morning.    [provider]  fluticasone  (FLONASE ) 50 MCG/ACT nasal spray Place 1 spray into both nostrils in the morning. 09/05/13   [name], MD  levothyroxine  (SYNTHROID ) 100 MCG tablet Take 100 mcg by mouth daily before breakfast.    [provider]  metFORMIN  (GLUCOPHAGE ) 500 MG tablet Take 1 tablet (500  mg total) by mouth 2 (two) times daily with a meal. 08/03/13   Collins, Tillman CROME, PA-C  Multiple Vitamin (MULTIVITAMIN WITH MINERALS) TABS tablet Take 1 tablet by mouth in the morning.    [provider]  oxyCODONE  (OXY IR/ROXICODONE ) 5 MG immediate release tablet Take 1 tablet (5 mg total) by mouth every 6 (six) hours as needed for moderate pain (pain score 4-6). 07/10/24   Gherghe, Costin M, MD  oxymetazoline (AFRIN) 0.05 %  nasal spray Place 1 spray into both nostrils every 20 (twenty) minutes as needed (nose bleed.). 07/10/24   Gherghe, Costin M, MD  pantoprazole  (PROTONIX ) 40 MG tablet TAKE 1 TABLET 30 TO 60 MINUTES BEFORE FIRST MEAL OF THE DAY. Patient taking differently: Take 40 mg by mouth daily before breakfast. 03/15/24   Darlean Ozell NOVAK, MD  Polyethyl Glycol-Propyl Glycol (SYSTANE OP) Place 1 drop into both eyes daily as needed (dry eyes).    [provider]  potassium chloride  SA (KLOR-CON  M) 20 MEQ tablet Take 1 tablet (20 mEq total) by mouth daily. Patient taking differently: Take 20 mEq by mouth in the morning. 11/14/22 07/19/24  Rai, Nydia POUR, MD  torsemide  (DEMADEX ) 20 MG tablet Take 20mg  (1 tab) by mouth every other day alternating with 40mg  (2 tabs) every other day Patient taking differently: Take 20-40 mg by mouth See admin instructions. Take 20mg  (1 tab) by mouth every other day alternating with 40mg  (2 tabs) every other day. 04/12/24   [name], MD  TRELEGY ELLIPTA  100-62.5-25 MCG/ACT AEPB Inhale 1 puff into the lungs daily. 06/25/24   [provider]  warfarin (COUMADIN ) 2.5 MG tablet TAKE AS DIRECTED BY COUMADIN  CLINIC. 05/13/24   [name], MD    Physical Exam: Vitals:   07/21/24 1745 07/21/24 1800 07/21/24 1940 07/21/24 2009  BP: 122/60 (!) 116/58 (!) 137/47 126/78  Pulse: 62 62 62 60  Resp: (!) 21 (!) 21 16 (!) 21  Temp:   97.7 F (36.5 C)   TempSrc:   Oral   SpO2: 99% 100% 97% 99%  Weight:       *** Data Reviewed: {Tip this will not be part of the note when signed- Document your independent interpretation of telemetry tracing, EKG, lab, Radiology test or any other diagnostic tests. Add any new diagnostic test ordered today. (Optional):26781} {Results:26384}  Assessment and Plan: No notes have been filed under this hospital service. Service: Hospitalist     Advance Care Planning:   Code Status: Prior ***  Consults: ***  Family  Communication: ***  Severity of Illness: {Observation/Inpatient:21159}  Author: Posey Maier, DO 07/21/2024 8:49 PM  For on call review [email or web address].

## 2024-07-21 NOTE — ED Triage Notes (Signed)
 BIB family from home for subjective fever, nausea, non-productive cough, diarrhea and body aches. Rates pain 4/10. Denies other sx. No meds PTA. Steady gait. Alert, NAD, calm, interactive, resps e/u, speaking in clear complete sentences.

## 2024-07-21 NOTE — ED Notes (Signed)
 Pt's husband came out of room stating that patient's eyes rolled back and that she started to shake.  This RN went in room and pt was talking to RN with no issues at this time. No seizure like activity at this time. 02 starting to drop while in the room, pt placed on 4L [place].  PA Willma made aware and came to bedside.

## 2024-07-21 NOTE — ED Notes (Signed)
Pt ambulated with assistance to the bathroom.

## 2024-07-21 NOTE — H&P (Incomplete)
 History and Physical    Patient: [name] FMW:[id number] DOB: 09-02-1946 DOA: 07/21/2024 DOS: the patient was seen and examined on 07/22/2024 PCP: Sheryle Carwin, MD  Patient coming from: Home  Chief Complaint:  Chief Complaint  Patient presents with   Fever   HPI: [name] is a 78 y.o. female with medical history significant of hypothyroidism, type 2 diabetes mellitus, COPD, HFpEF, A-fib/flutter on warfarin, rheumatic heart disease, AV and MV replacement, CVA, carotid artery stenosis, CKD-3B, lumbar laminectomy and compression and mild cognitive impairment who presents to the ED emergency department due to 4-day onset of worsening generalized weakness, subjective fever, nausea, diarrhea.  Patient usually ambulates within the house and without the need of any assistive device, but she has been requiring a rollator whereby she sits on it instead of walking with it due to weakness. Husband reported a syncopal episode while in the ED  She was admitted to [facility] from 9/26 to 10/8 due to syncope and collapse in which she sustained a head trauma and was found to have intraparenchymal hemorrhage (she is on warfarin).  Repeated MRI reviewed by neurosurgery at that time was stable and there was no need for surgical intervention.  She was seen in the ED on 10/11 due to UTI and was discharged home with antibiotics.  She was doing fairly until she started to complain of weakness as described above.  ED Course:  In the emergency department, she was hemodynamically stable.  Workup in the ED showed normocytic anemia.  BMP was normal except for chloride of 97, glucose 113, creatinine 1.58.  D-dimer 2.15, urinalysis was normal.  Influenza A, B, SARS coronavirus 2, RSV was negative. CT angiography of chest showed no pulmonary embolism. Chronic bilateral pleural effusions calcified plaques suggestive of prior asbestos exposure. Indeterminate chronic stable right hilar, left hilar, and prevascular  mediastinal lymphadenopathy. Chest x-ray shows stable chest.  No acute process Patient was treated with calcium  carbonate.  TRH was asked to admit patient.   Review of Systems: Review of systems as noted in the HPI. All other systems reviewed and are negative.   Past Medical History:  Diagnosis Date   Anemia    Asthma 2010   Atrial fibrillation (HCC)    Atrial flutter (HCC)    Breast carcinoma (HCC) 1995   1995   Cardioembolic stroke (HCC) 01/2012   Right frontal in 01/2012; normal carotid ultrasound; possible LAA thrombus by TEE; virtual complete neurologic recovery   Carotid artery stenosis 1961   Chronic kidney disease, stage 2, mildly decreased GFR    GFR of approximately 60   Diabetes mellitus without complication (HCC) 5+ years ago   Controlled most of time   Diverticulosis of colon (without mention of hemorrhage) 2012   Dr. Golda   Fasting hyperglycemia    120 fasting   Gastroesophageal reflux disease    Gastroparesis    Heart failure with improved ejection fraction (HFimpEF) (HCC) 5+ years   Heart murmur 1961   Hemorrhoids    History of aortic valve replacement with bioprosthetic valve    History of mitral valve replacement with bioprosthetic valve    Hyperlipidemia    Hypertension    pt denies 05/30/13     Dr Alvan chester   Hyponatremia    Hypothyroidism    Rheumatic heart disease    Shortness of breath    Syncope and collapse February, 2024   Past Surgical History:  Procedure Laterality Date   AORTIC VALVE  REPLACEMENT N/A 07/18/2013   Procedure: AORTIC VALVE REPLACEMENT (AVR);  Surgeon: Dorise MARLA Fellers, MD;  Location: [facility] OR;  Service: Open Heart Surgery;  Laterality: N/A;   BACK SURGERY     BREAST LUMPECTOMY Right 10/03/1993   CARDIAC CATHETERIZATION  5+ years   CARDIOVERSION N/A 07/26/2013   Procedure: CARDIOVERSION;  Surgeon: Vina LULLA Gull, MD;  Location: [facility] ENDOSCOPY;  Service: Cardiovascular;  Laterality: N/A;   CARDIOVERSION N/A 01/04/2023    Procedure: CARDIOVERSION;  Surgeon: Hobart Powell BRAVO, MD;  Location: [facility] ENDOSCOPY;  Service: Cardiovascular;  Laterality: N/A;   CARDIOVERSION N/A 02/23/2024   Procedure: CARDIOVERSION;  Surgeon: [name], MD;  Location: [facility] ORS;  Service: Endoscopy;  Laterality: N/A;   COLONOSCOPY  10/03/2010   Negative screening procedure   DILATION AND CURETTAGE OF UTERUS     ESOPHAGEAL MANOMETRY N/A 06/17/2013   Procedure: ESOPHAGEAL MANOMETRY (EM);  Surgeon: Alm JONELLE Gander, MD;  Location: [facility] ENDOSCOPY;  Service: Endoscopy;  Laterality: N/A;   INTRAOPERATIVE TRANSESOPHAGEAL ECHOCARDIOGRAM N/A 07/18/2013   Procedure: INTRAOPERATIVE TRANSESOPHAGEAL ECHOCARDIOGRAM;  Surgeon: Dorise MARLA Fellers, MD;  Location: [facility] OR;  Service: Open Heart Surgery;  Laterality: N/A;   LEFT AND RIGHT HEART CATHETERIZATION WITH CORONARY ANGIOGRAM N/A 07/10/2013   Procedure: LEFT AND RIGHT HEART CATHETERIZATION WITH CORONARY ANGIOGRAM;  Surgeon: Lonni JONETTA Cash, MD;  Location: [facility] CATH LAB;  Service: Cardiovascular;  Laterality: N/A;   LOOP RECORDER INSERTION N/A 07/02/2024   Procedure: LOOP RECORDER INSERTION;  Surgeon: Nancey Eulas BRAVO, MD;  Location: [facility] INVASIVE CV LAB;  Service: Cardiovascular;  Laterality: N/A;   LUMBAR LAMINECTOMY/DECOMPRESSION MICRODISCECTOMY Right 05/13/2014   Procedure: LUMBAR LAMINECTOMY/DECOMPRESSION MICRODISCECTOMY 1 LEVEL  lumbar four/five;  Surgeon: Darina MALVA Boehringer, MD;  Location: [facility] NEURO ORS;  Service: Neurosurgery;  Laterality: Right;   MITRAL VALVE REPLACEMENT N/A 07/18/2013   Procedure: MITRAL VALVE (MV) REPLACEMENT;  Surgeon: Dorise MARLA Fellers, MD;  Location: [facility] OR;  Service: Open Heart Surgery;  Laterality: N/A;   MITRAL VALVE SURGERY  10/03/1960   [facility], closed mitral valvulotomy by finger fracture   TEE WITHOUT CARDIOVERSION  01/24/2012   Procedure: TRANSESOPHAGEAL ECHOCARDIOGRAM (TEE);  Surgeon: Redell GORMAN Shallow, MD;  Location: [facility] ENDOSCOPY;  Service: Cardiovascular;   Laterality: N/A;   TEE WITHOUT CARDIOVERSION N/A 07/11/2013   Procedure: TRANSESOPHAGEAL ECHOCARDIOGRAM (TEE);  Surgeon: Aleene JINNY Passe, MD;  Location: [facility] ENDOSCOPY;  Service: Cardiovascular;  Laterality: N/A;   TEE WITHOUT CARDIOVERSION N/A 07/26/2013   Procedure: TRANSESOPHAGEAL ECHOCARDIOGRAM (TEE);  Surgeon: Vina LULLA Gull, MD;  Location: [facility] ENDOSCOPY;  Service: Cardiovascular;  Laterality: N/A;   TUBAL LIGATION  10/04/1971    Social History:  reports that she has never smoked. She has never used smokeless tobacco. She reports that she does not drink alcohol and does not use drugs.   No Known Allergies  Family History  Problem Relation Age of Onset   Heart disease Brother 67       MI   Rheum arthritis Maternal Grandmother    Asthma Maternal Grandfather    Hypothyroidism Mother    Diabetes Sister    Cirrhosis Father      Prior to Admission medications   Medication Sig Start Date End Date Taking? Authorizing Provider  acetaminophen  (TYLENOL ) 325 MG tablet Take 2 tablets (650 mg total) by mouth every 6 (six) hours as needed. 01/20/24   [name], DO  albuterol  (VENTOLIN  HFA) 108 (90 Base) MCG/ACT inhaler Inhale 2 puffs into the lungs every 6 (six) hours  as needed for wheezing or shortness of breath. 08/17/23   [provider]  amiodarone  (PACERONE ) 200 MG tablet Take 1 tablet (200 mg total) by mouth daily. Patient taking differently: Take 200 mg by mouth in the morning. 02/02/24   Mealor, [name], MD  atorvastatin  (LIPITOR) 40 MG tablet Take 1 tablet (40 mg total) by mouth daily. 07/05/24   [name], MD  azelastine  (ASTELIN ) 0.1 % nasal spray Place 1 spray into both nostrils 2 (two) times daily. Use in each nostril as directed 04/30/24   [name], NP  bisoprolol  (ZEBETA ) 5 MG tablet Take 1 tablet (5 mg total) by mouth daily. Patient taking differently: Take 5 mg by mouth in the morning. 02/23/24   Furth, Cadence H, PA-C  budesonide -formoterol   (SYMBICORT ) 80-4.5 MCG/ACT inhaler One puff first thing in am and 12 hours later Patient taking differently: One puff first thing in am and 12 hours later. Use 1 week then swap to Trelegy for a week. 05/13/24   Darlean Ozell NOVAK, MD  calcium  carbonate (TUMS - DOSED IN MG ELEMENTAL CALCIUM ) 500 MG chewable tablet Chew 2-3 tablets by mouth as needed for indigestion or heartburn.    [provider]  dextromethorphan  15 MG/5ML syrup Take 10 mLs by mouth as needed for cough.    [provider]  escitalopram  (LEXAPRO ) 10 MG tablet Take 10 mg by mouth daily with breakfast. 04/10/15   [provider]  famotidine  (PEPCID ) 20 MG tablet TAKE ONE TABLET BY MOUTH EVERY DAY AFTER SUPPER 12/25/23   Wert, Michael B, MD  FARXIGA  10 MG TABS tablet Take 10 mg by mouth in the morning. 03/28/23   [provider]  fexofenadine (ALLEGRA) 180 MG tablet Take 180 mg by mouth in the morning.    [provider]  fluticasone  (FLONASE ) 50 MCG/ACT nasal spray Place 1 spray into both nostrils in the morning. 09/05/13   [name], MD  levothyroxine  (SYNTHROID ) 100 MCG tablet Take 100 mcg by mouth daily before breakfast.    [provider]  metFORMIN  (GLUCOPHAGE ) 500 MG tablet Take 1 tablet (500 mg total) by mouth 2 (two) times daily with a meal. 08/03/13   Collins, Tillman CROME, PA-C  Multiple Vitamin (MULTIVITAMIN WITH MINERALS) TABS tablet Take 1 tablet by mouth in the morning.    [provider]  oxyCODONE  (OXY IR/ROXICODONE ) 5 MG immediate release tablet Take 1 tablet (5 mg total) by mouth every 6 (six) hours as needed for moderate pain (pain score 4-6). 07/10/24   Gherghe, Costin M, MD  oxymetazoline (AFRIN) 0.05 % nasal spray Place 1 spray into both nostrils every 20 (twenty) minutes as needed (nose bleed.). 07/10/24   Gherghe, Costin M, MD  pantoprazole  (PROTONIX ) 40 MG tablet TAKE 1 TABLET 30 TO 60 MINUTES BEFORE FIRST MEAL OF THE DAY. Patient taking differently: Take 40  mg by mouth daily before breakfast. 03/15/24   Darlean Ozell NOVAK, MD  Polyethyl Glycol-Propyl Glycol (SYSTANE OP) Place 1 drop into both eyes daily as needed (dry eyes).    [provider]  potassium chloride  SA (KLOR-CON  M) 20 MEQ tablet Take 1 tablet (20 mEq total) by mouth daily. Patient taking differently: Take 20 mEq by mouth in the morning. 11/14/22 07/19/24  Rai, Nydia POUR, MD  torsemide  (DEMADEX ) 20 MG tablet Take 20mg  (1 tab) by mouth every other day alternating with 40mg  (2 tabs) every other day Patient taking differently: Take 20-40 mg by mouth See admin instructions.  Take 20mg  (1 tab) by mouth every other day alternating with 40mg  (2 tabs) every other day. 04/12/24   [name], MD  TRELEGY ELLIPTA  100-62.5-25 MCG/ACT AEPB Inhale 1 puff into the lungs daily. 06/25/24   [provider]  warfarin (COUMADIN ) 2.5 MG tablet TAKE AS DIRECTED BY COUMADIN  CLINIC. 05/13/24   [name], MD    Physical Exam: BP (!) 121/33 (BP Location: Left Arm)   Pulse (!) 59   Temp 97.6 F (36.4 C) (Oral)   Resp (!) 30   Wt 67.2 kg   SpO2 100%   BMI 26.24 kg/m   General: 78 y.o. year-old female ill appearing, but in no acute distress.  Alert and oriented x3. HEENT: NCAT, EOMI Neck: Supple, trachea medial Cardiovascular: Regular rate and rhythm with no rubs or gallops.  No thyromegaly or JVD noted.  No lower extremity edema. 2/4 pulses in all 4 extremities. Respiratory: Clear to auscultation with no wheezes or rales. Good inspiratory effort. Abdomen: Soft, nontender nondistended with normal bowel sounds x4 quadrants. Muskuloskeletal: No cyanosis, clubbing or edema noted bilaterally Neuro: No focal neurologic deficit.  Sensation, reflexes intact Skin: No ulcerative lesions noted or rashes Psychiatry: Mood is appropriate for condition and setting          Labs on Admission:  Basic Metabolic Panel: Recent Labs  Lab 07/21/24 1702  NA 137  K 3.7  CL 97*  CO2 28   GLUCOSE 113*  BUN 20  CREATININE 1.58*  CALCIUM  8.9  MG 1.6*  PHOS 3.6   Liver Function Tests: Recent Labs  Lab 07/21/24 1702  AST 28  ALT 18  ALKPHOS 69  BILITOT 0.4  PROT 7.1  ALBUMIN  4.0   Recent Labs  Lab 07/21/24 1702  LIPASE 46   No results for input(s): AMMONIA in the last 168 hours. CBC: Recent Labs  Lab 07/21/24 1702 07/22/24 0045  WBC 5.3 5.7  NEUTROABS 3.5  --   HGB 11.5* 11.4*  HCT 37.9 37.6  MCV 88.1 86.8  PLT 304 296   Cardiac Enzymes: No results for input(s): CKTOTAL, CKMB, CKMBINDEX, TROPONINI in the last 168 hours.  BNP (last 3 results) Recent Labs    01/25/24 2031 03/14/24 1016 05/30/24 0727  BNP 775.0* 503.0* 528.0*    ProBNP (last 3 results) No results for input(s): PROBNP in the last 8760 hours.  CBG: Recent Labs  Lab 07/21/24 2159  GLUCAP 102*    Radiological Exams on Admission: CT Angio Chest PE W and/or Wo Contrast Result Date: 07/21/2024 EXAM: CTA CHEST 07/21/2024 07:16:17 PM TECHNIQUE: CTA of the chest was performed after the administration of intravenous contrast. Multiplanar reformatted images are provided for review. MIP images are provided for review. Automated exposure control, iterative reconstruction, and/or weight based adjustment of the mA/kV was utilized to reduce the radiation dose to as low as reasonably achievable. COMPARISON: CT angiography chest 08:30 11/10/2020 CLINICAL HISTORY: Pulmonary embolism (PE) suspected, low to intermediate prob, positive D-dimer. Table formatting from the original note was not included.; BIB family from home for subjective fever, nausea, non-productive cough, diarrhea and body aches. Rates pain 4/10. Denies other sx. No meds PTA. Steady gait. Alert, NAD, calm, interactive, resps e/u, speaking in clear complete sentences. FINDINGS: PULMONARY ARTERIES: Pulmonary arteries are adequately opacified for evaluation. No acute pulmonary embolus. Main pulmonary artery is normal in  caliber. MEDIASTINUM: Aortic and mitral annular valve replacements. Prominent heart size. 4-vessel coronary artery calcification. Severe atherosclerotic plaque of the thoracic aorta.  The pericardium demonstrates no acute abnormality. LYMPH NODES: Chronic stable Right hilar lymphadenopathy measuring up to 1.1 cm. Chronic stable Left hilar lymphadenopathy measuring up to 1.2 cm. Chronic stable Prevascular mediastinal lymphadenopathy measuring up to 1 cm. No axillary lymphadenopathy. LUNGS AND PLEURA: Biapical pleural/pulmonary scarring. Pleural calcifications noted bilaterally. Chronic stable Subpleural left lower lobe pulmonary micronodule (series 6, image 57) -no further follow-up indicated . Subpleural triangular micronodule along the right minor fissure likely an intrapulmonary lymph node no further follow-up indicated . Reticulations along the right anterior upper lobe likely sequelae of prior radiation therapy. No evidence of pleural effusion or pneumothorax. UPPER ABDOMEN: Colonic diverticulosis. SOFT TISSUES AND BONES: Right breast surgical changes suggestive of lumpectomy. No acute bone or soft tissue abnormality. IMPRESSION: 1. No pulmonary embolism. 2. Chronic bilateral pleural effusions calcified plaques suggestive of prior asbestos exposure. 3. Indeterminate chronic stable right hilar, left hilar, and prevascular mediastinal lymphadenopathy. 4. Severe atherosclerotic plaque of the thoracic aorta and 4-vessel coronary artery calcification. Electronically signed by: Morgane Naveau MD 07/21/2024 07:32 PM EDT RP Workstation: [id number]   DG Chest 2 View Result Date: 07/21/2024 CLINICAL DATA:  Short of breath, fever EXAM: CHEST - 2 VIEW COMPARISON:  06/30/2024 FINDINGS: Frontal and lateral views of the chest demonstrates stable postsurgical changes from median sternotomy and mitral and aortic valve replacement. The cardiac silhouette is enlarged but stable. Chronic central vascular congestion without  airspace disease, effusion, or pneumothorax. No acute bony abnormalities. IMPRESSION: 1. Stable chest, no acute process. Electronically Signed   By: Ozell Daring M.D.   On: 07/21/2024 17:50    EKG: I independently viewed the EKG done and my findings are as followed: Sinus or ectopic atrial rhythm at a rate of 64 bpm and prolonged PR interval (347 ms) with QTc of 524 ms  Assessment/Plan Present on Admission:  Syncope  Gastroesophageal reflux disease  Essential hypertension  Acquired hypothyroidism  Mixed hyperlipidemia  Intraparenchymal hematoma of brain (HCC)  Active Problems:   Gastroesophageal reflux disease   Mixed hyperlipidemia   Essential hypertension   Acquired hypothyroidism   Syncope   Intraparenchymal hematoma of brain (HCC)   Generalized weakness   Elevated d-dimer   Chronic kidney disease, stage 3b (HCC)   Chronic heart failure with preserved ejection fraction (HFpEF) (HCC)   Mood disorder  Syncope Continue telemetry and watch for arrhythmias Troponins pending.  Echocardiogram done on 06/29/2024 showed EF of 77 5%.  No WMA.  LV diastolic parameters indeterminate. Echocardiogram will be done to rule out significant aortic stenosis or other outflow obstruction, and also to evaluate EF and to rule out segmental/Regional wall motion abnormalities.  Carotid artery Dopplers will be done to rule out hemodynamically significant stenosis  Generalized weakness Protein supplement will be provided Continue fall precaution Continue PT/OT eval and treat  Supratherapeutic INR INR 3.7, warfarin will be held at this time  Hypomagnesemia Magnesium  1.6, this will be replenished  Prolonged QT interval QTc 524 ms Avoid QT prolonging drugs Magnesium  was 1.6, this was replenished Repeat EKG in the morning  Elevated D-dimer D-dimer 2.15 CT angiograph of chest ruled out pulmonary embolism  CKD 3B Creatinine 1.58 (creatinine is within baseline range) Renally adjust  medications, avoid nephrotoxic agents/dehydration/hypotension  History of left intraparenchymal hemorrhage/SAH Stable. INR elevated at 3.7,, this will be held at this time  Chronic HFpEF Continue total input/output, daily weights and fluid restriction Continue heart healthy diet  Continue home torsemide , Farxiga  Bisoprolol  temporarily held due to prolonged PR interval TTE done  on 06/29/2024 showed EF of 70-75%.  No RWMA.  Indeterminate LV diastolic parameters.  Paroxysmal A-fib/flutter Continue amiodarone  Bisoprolol  will be temporarily held due to prolonged PR interval (347 ms) Warfarin will be held at this time due to supratherapeutic INR  Essential hypertension (controlled) Continue home torsemide  Continue Klor-Con    Acquired hypothyroidism Continue home Synthroid    GERD Continue PPI  Mixed hyperlipidemia Continue Lipitor   Mood disorder Continue Lexapro .  DVT prophylaxis: SCDs  Code Status: Full code  Family Communication: Husband at bedside (all questions answered to satisfaction)  Consults: None  Severity of Illness: The appropriate patient status for this patient is OBSERVATION. Observation status is judged to be reasonable and necessary in order to provide the required intensity of service to ensure the patient's safety. The patient's presenting symptoms, physical exam findings, and initial radiographic and laboratory data in the context of their medical condition is felt to place them at decreased risk for further clinical deterioration. Furthermore, it is anticipated that the patient will be medically stable for discharge from the hospital within 2 midnights of admission.   Author: Davanna He, DO 07/22/2024 1:20 AM  For on call review [email or web address].

## 2024-07-21 NOTE — ED Provider Notes (Incomplete)
 [place] EMERGENCY DEPARTMENT AT [facility] Provider Note   CSN: [id number] Arrival date & time: 07/21/24  1620     Patient presents with: Fever   [name] is a 78 y.o. female presents to the ED with subjective fever, nausea, cough, diarrhea, and bodyaches that have been ongoing x 4 days.  Today her husband noticed that she was more weak than normal and having to use it and use her rollator instead of standing and walking with it.  Patient has been having ongoing weakness since a traumatic fall 1 month that resulted in intercerebral hemorrhage and hospital admission.  The patient was seen for UTI in the emergency department last week and is still taking antibiotics for same.  Patient states that she feels general malaise and weakness that seems to be getting worse.  She denies any chest pain, headache, numbness, tingling, dizziness, or syncopal episodes.  Patient reports she has had a single episode of loose stool today that was not dark in color.  The patient states that urine output today has been yellow in color.  Patient and her husband are concerned today that she still be struggling with the UTI as she is still exhibiting symptoms. {Add pertinent medical, surgical, social history, OB history to HPI:32947}  Fever      Prior to Admission medications   Medication Sig Start Date End Date Taking? Authorizing Provider  acetaminophen  (TYLENOL ) 325 MG tablet Take 2 tablets (650 mg total) by mouth every 6 (six) hours as needed. 01/20/24   [name], DO  albuterol  (VENTOLIN  HFA) 108 (90 Base) MCG/ACT inhaler Inhale 2 puffs into the lungs every 6 (six) hours as needed for wheezing or shortness of breath. 08/17/23   [provider]  amiodarone  (PACERONE ) 200 MG tablet Take 1 tablet (200 mg total) by mouth daily. Patient taking differently: Take 200 mg by mouth in the morning. 02/02/24   Mealor, [name], MD  atorvastatin  (LIPITOR) 40 MG tablet Take 1 tablet (40 mg  total) by mouth daily. 07/05/24   [name], MD  azelastine  (ASTELIN ) 0.1 % nasal spray Place 1 spray into both nostrils 2 (two) times daily. Use in each nostril as directed 04/30/24   [name], NP  bisoprolol  (ZEBETA ) 5 MG tablet Take 1 tablet (5 mg total) by mouth daily. Patient taking differently: Take 5 mg by mouth in the morning. 02/23/24   Furth, Cadence H, PA-C  budesonide -formoterol  (SYMBICORT ) 80-4.5 MCG/ACT inhaler One puff first thing in am and 12 hours later Patient taking differently: One puff first thing in am and 12 hours later. Use 1 week then swap to Trelegy for a week. 05/13/24   Darlean Ozell NOVAK, MD  calcium  carbonate (TUMS - DOSED IN MG ELEMENTAL CALCIUM ) 500 MG chewable tablet Chew 2-3 tablets by mouth as needed for indigestion or heartburn.    [provider]  dextromethorphan  15 MG/5ML syrup Take 10 mLs by mouth as needed for cough.    [provider]  escitalopram  (LEXAPRO ) 10 MG tablet Take 10 mg by mouth daily with breakfast. 04/10/15   [provider]  famotidine  (PEPCID ) 20 MG tablet TAKE ONE TABLET BY MOUTH EVERY DAY AFTER SUPPER 12/25/23   Wert, Michael B, MD  FARXIGA  10 MG TABS tablet Take 10 mg by mouth in the morning. 03/28/23   [provider]  fexofenadine (ALLEGRA) 180 MG tablet Take 180 mg by mouth in the morning.    [provider]  fluticasone  (FLONASE ) 50 MCG/ACT nasal spray Place 1 spray into both nostrils in the morning. 09/05/13   [name], MD  levothyroxine  (SYNTHROID ) 100 MCG tablet Take 100 mcg by mouth daily before breakfast.    [provider]  metFORMIN  (GLUCOPHAGE ) 500 MG tablet Take 1 tablet (500 mg total) by mouth 2 (two) times daily with a meal. 08/03/13   Collins, Tillman CROME, PA-C  Multiple Vitamin (MULTIVITAMIN WITH MINERALS) TABS tablet Take 1 tablet by mouth in the morning.    [provider]  oxyCODONE  (OXY IR/ROXICODONE ) 5 MG immediate release tablet Take 1 tablet (5  mg total) by mouth every 6 (six) hours as needed for moderate pain (pain score 4-6). 07/10/24   Gherghe, Costin M, MD  oxymetazoline (AFRIN) 0.05 % nasal spray Place 1 spray into both nostrils every 20 (twenty) minutes as needed (nose bleed.). 07/10/24   Gherghe, Costin M, MD  pantoprazole  (PROTONIX ) 40 MG tablet TAKE 1 TABLET 30 TO 60 MINUTES BEFORE FIRST MEAL OF THE DAY. Patient taking differently: Take 40 mg by mouth daily before breakfast. 03/15/24   Darlean Ozell NOVAK, MD  Polyethyl Glycol-Propyl Glycol (SYSTANE OP) Place 1 drop into both eyes daily as needed (dry eyes).    [provider]  potassium chloride  SA (KLOR-CON  M) 20 MEQ tablet Take 1 tablet (20 mEq total) by mouth daily. Patient taking differently: Take 20 mEq by mouth in the morning. 11/14/22 07/19/24  Rai, Nydia POUR, MD  torsemide  (DEMADEX ) 20 MG tablet Take 20mg  (1 tab) by mouth every other day alternating with 40mg  (2 tabs) every other day Patient taking differently: Take 20-40 mg by mouth See admin instructions. Take 20mg  (1 tab) by mouth every other day alternating with 40mg  (2 tabs) every other day. 04/12/24   [name], MD  TRELEGY ELLIPTA  100-62.5-25 MCG/ACT AEPB Inhale 1 puff into the lungs daily. 06/25/24   [provider]  warfarin (COUMADIN ) 2.5 MG tablet TAKE AS DIRECTED BY COUMADIN  CLINIC. 05/13/24   [name], MD    Allergies: Patient has no known allergies.    Review of Systems  Constitutional:  Positive for fever.    Updated Vital Signs BP (!) 116/58   Pulse 62   Temp 98.3 F (36.8 C) (Oral)   Resp (!) 21   Wt 69.9 kg   SpO2 100%   BMI 27.28 kg/m   Physical Exam  (all labs ordered are listed, but only abnormal results are displayed) Labs Reviewed  COMPREHENSIVE METABOLIC PANEL WITH GFR - Abnormal; Notable for the following components:      Result Value   Chloride 97 (*)    Glucose, Bld 113 (*)    Creatinine, Ser 1.58 (*)    GFR, Estimated 33 (*)    All other  components within normal limits  CBC WITH DIFFERENTIAL/PLATELET - Abnormal; Notable for the following components:   Hemoglobin 11.5 (*)    RDW 17.4 (*)    All other components within normal limits  D-DIMER, QUANTITATIVE - Abnormal; Notable for the following components:   D-Dimer, Quant 2.15 (*)    All other components within normal limits  PROTIME-INR - Abnormal; Notable for the following components:   Prothrombin Time 38.6 (*)    INR 3.7 (*)    All other components within normal limits  RESP PANEL BY RT-PCR (RSV, FLU A&B, COVID)  RVPGX2  LIPASE, BLOOD  URINALYSIS, ROUTINE W REFLEX MICROSCOPIC    EKG: EKG Interpretation Date/Time:  Sunday July 21 2024 17:17:27 EDT Ventricular Rate:  64 PR Interval:  347 QRS Duration:  101 QT Interval:  507 QTC Calculation: 524 R Axis:   145  Text Interpretation: Sinus or ectopic atrial rhythm Prolonged PR interval Probable right ventricular hypertrophy Nonspecific T abnrm, anterolateral leads Prolonged QT interval Confirmed by Cleotilde Rogue ([id number]) on 07/21/2024 5:21:14 PM  Radiology: [facility] Chest 2 View Result Date: 07/21/2024 CLINICAL DATA:  Short of breath, fever EXAM: CHEST - 2 VIEW COMPARISON:  06/30/2024 FINDINGS: Frontal and lateral views of the chest demonstrates stable postsurgical changes from median sternotomy and mitral and aortic valve replacement. The cardiac silhouette is enlarged but stable. Chronic central vascular congestion without airspace disease, effusion, or pneumothorax. No acute bony abnormalities. IMPRESSION: 1. Stable chest, no acute process. Electronically Signed   By: Ozell Daring M.D.   On: 07/21/2024 17:50    {Document cardiac monitor, telemetry assessment procedure when appropriate:32947} Procedures   Medications Ordered in the ED - No data to display    {Click here for ABCD2, HEART and other calculators REFRESH Note before signing:1}                              Medical Decision Making Amount and/or  Complexity of Data Reviewed Labs: ordered. Radiology: ordered.  Risk Prescription drug management. Decision regarding hospitalization.   ***  {Document critical care time when appropriate  Document review of labs and clinical decision tools ie CHADS2VASC2, etc  Document your independent review of radiology images and any outside records  Document your discussion with family members, caretakers and with consultants  Document social determinants of health affecting pt's care  Document your decision making why or why not admission, treatments were needed:32947:::1}   Final diagnoses:  None    ED Discharge Orders     None

## 2024-07-21 NOTE — ED Notes (Signed)
 Patient transported to CT

## 2024-07-21 NOTE — ED Provider Notes (Signed)
 [place] EMERGENCY DEPARTMENT AT [facility] Provider Note   CSN: [id number] Arrival date & time: 07/21/24  1620     Patient presents with: Fever   [name] is a 78 y.o. female presents to the ED with subjective fever, nausea, cough, diarrhea, and bodyaches that have been ongoing x 4 days.  Today her husband noticed that she was more weak than normal and having to use it and use her rollator instead of standing and walking with it.  Patient has been having ongoing weakness since a traumatic fall 1 month that resulted in intercerebral hemorrhage and hospital admission.  The patient was seen for UTI in the emergency department last week and is still taking antibiotics for same.  Patient states that she feels general malaise and weakness that seems to be getting worse.  She denies any chest pain, headache, numbness, tingling, dizziness, or syncopal episodes.  Patient reports she has had a single episode of loose stool today that was not dark in color.  The patient states that urine output today has been yellow in color.  Patient and her husband are concerned today that she still be struggling with the UTI as she is still exhibiting symptoms. {Add pertinent medical, surgical, social history, OB history to HPI:32947}  Fever      Prior to Admission medications   Medication Sig Start Date End Date Taking? Authorizing Provider  acetaminophen  (TYLENOL ) 325 MG tablet Take 2 tablets (650 mg total) by mouth every 6 (six) hours as needed. 01/20/24   [name], DO  albuterol  (VENTOLIN  HFA) 108 (90 Base) MCG/ACT inhaler Inhale 2 puffs into the lungs every 6 (six) hours as needed for wheezing or shortness of breath. 08/17/23   [provider]  amiodarone  (PACERONE ) 200 MG tablet Take 1 tablet (200 mg total) by mouth daily. Patient taking differently: Take 200 mg by mouth in the morning. 02/02/24   Mealor, [name], MD  atorvastatin  (LIPITOR) 40 MG tablet Take 1 tablet (40 mg  total) by mouth daily. 07/05/24   [name], MD  azelastine  (ASTELIN ) 0.1 % nasal spray Place 1 spray into both nostrils 2 (two) times daily. Use in each nostril as directed 04/30/24   [name], NP  bisoprolol  (ZEBETA ) 5 MG tablet Take 1 tablet (5 mg total) by mouth daily. Patient taking differently: Take 5 mg by mouth in the morning. 02/23/24   Furth, Cadence H, PA-C  budesonide -formoterol  (SYMBICORT ) 80-4.5 MCG/ACT inhaler One puff first thing in am and 12 hours later Patient taking differently: One puff first thing in am and 12 hours later. Use 1 week then swap to Trelegy for a week. 05/13/24   Darlean Ozell NOVAK, MD  calcium  carbonate (TUMS - DOSED IN MG ELEMENTAL CALCIUM ) 500 MG chewable tablet Chew 2-3 tablets by mouth as needed for indigestion or heartburn.    [provider]  dextromethorphan  15 MG/5ML syrup Take 10 mLs by mouth as needed for cough.    [provider]  escitalopram  (LEXAPRO ) 10 MG tablet Take 10 mg by mouth daily with breakfast. 04/10/15   [provider]  famotidine  (PEPCID ) 20 MG tablet TAKE ONE TABLET BY MOUTH EVERY DAY AFTER SUPPER 12/25/23   Wert, Michael B, MD  FARXIGA  10 MG TABS tablet Take 10 mg by mouth in the morning. 03/28/23   [provider]  fexofenadine (ALLEGRA) 180 MG tablet Take 180 mg by mouth in the morning.    [provider]  fluticasone  (FLONASE ) 50 MCG/ACT nasal spray Place 1 spray into both nostrils in the morning. 09/05/13   [name], MD  levothyroxine  (SYNTHROID ) 100 MCG tablet Take 100 mcg by mouth daily before breakfast.    [provider]  metFORMIN  (GLUCOPHAGE ) 500 MG tablet Take 1 tablet (500 mg total) by mouth 2 (two) times daily with a meal. 08/03/13   Collins, Tillman CROME, PA-C  Multiple Vitamin (MULTIVITAMIN WITH MINERALS) TABS tablet Take 1 tablet by mouth in the morning.    [provider]  oxyCODONE  (OXY IR/ROXICODONE ) 5 MG immediate release tablet Take 1 tablet (5  mg total) by mouth every 6 (six) hours as needed for moderate pain (pain score 4-6). 07/10/24   Gherghe, Costin M, MD  oxymetazoline (AFRIN) 0.05 % nasal spray Place 1 spray into both nostrils every 20 (twenty) minutes as needed (nose bleed.). 07/10/24   Gherghe, Costin M, MD  pantoprazole  (PROTONIX ) 40 MG tablet TAKE 1 TABLET 30 TO 60 MINUTES BEFORE FIRST MEAL OF THE DAY. Patient taking differently: Take 40 mg by mouth daily before breakfast. 03/15/24   Darlean Ozell NOVAK, MD  Polyethyl Glycol-Propyl Glycol (SYSTANE OP) Place 1 drop into both eyes daily as needed (dry eyes).    [provider]  potassium chloride  SA (KLOR-CON  M) 20 MEQ tablet Take 1 tablet (20 mEq total) by mouth daily. Patient taking differently: Take 20 mEq by mouth in the morning. 11/14/22 07/19/24  Rai, Nydia POUR, MD  torsemide  (DEMADEX ) 20 MG tablet Take 20mg  (1 tab) by mouth every other day alternating with 40mg  (2 tabs) every other day Patient taking differently: Take 20-40 mg by mouth See admin instructions. Take 20mg  (1 tab) by mouth every other day alternating with 40mg  (2 tabs) every other day. 04/12/24   [name], MD  TRELEGY ELLIPTA  100-62.5-25 MCG/ACT AEPB Inhale 1 puff into the lungs daily. 06/25/24   [provider]  warfarin (COUMADIN ) 2.5 MG tablet TAKE AS DIRECTED BY COUMADIN  CLINIC. 05/13/24   [name], MD    Allergies: Patient has no known allergies.    Review of Systems  Constitutional:  Positive for fever.    Updated Vital Signs BP (!) 116/58   Pulse 62   Temp 98.3 F (36.8 C) (Oral)   Resp (!) 21   Wt 69.9 kg   SpO2 100%   BMI 27.28 kg/m   Physical Exam  (all labs ordered are listed, but only abnormal results are displayed) Labs Reviewed  COMPREHENSIVE METABOLIC PANEL WITH GFR - Abnormal; Notable for the following components:      Result Value   Chloride 97 (*)    Glucose, Bld 113 (*)    Creatinine, Ser 1.58 (*)    GFR, Estimated 33 (*)    All other  components within normal limits  CBC WITH DIFFERENTIAL/PLATELET - Abnormal; Notable for the following components:   Hemoglobin 11.5 (*)    RDW 17.4 (*)    All other components within normal limits  D-DIMER, QUANTITATIVE - Abnormal; Notable for the following components:   D-Dimer, Quant 2.15 (*)    All other components within normal limits  PROTIME-INR - Abnormal; Notable for the following components:   Prothrombin Time 38.6 (*)    INR 3.7 (*)    All other components within normal limits  RESP PANEL BY RT-PCR (RSV, FLU A&B, COVID)  RVPGX2  LIPASE, BLOOD  URINALYSIS, ROUTINE W REFLEX MICROSCOPIC    EKG: EKG Interpretation Date/Time:  Sunday July 21 2024 17:17:27 EDT Ventricular Rate:  64 PR Interval:  347 QRS Duration:  101 QT Interval:  507 QTC Calculation: 524 R Axis:   145  Text Interpretation: Sinus or ectopic atrial rhythm Prolonged PR interval Probable right ventricular hypertrophy Nonspecific T abnrm, anterolateral leads Prolonged QT interval Confirmed by Cleotilde Rogue ([id number]) on 07/21/2024 5:21:14 PM  Radiology: [facility] Chest 2 View Result Date: 07/21/2024 CLINICAL DATA:  Short of breath, fever EXAM: CHEST - 2 VIEW COMPARISON:  06/30/2024 FINDINGS: Frontal and lateral views of the chest demonstrates stable postsurgical changes from median sternotomy and mitral and aortic valve replacement. The cardiac silhouette is enlarged but stable. Chronic central vascular congestion without airspace disease, effusion, or pneumothorax. No acute bony abnormalities. IMPRESSION: 1. Stable chest, no acute process. Electronically Signed   By: Ozell Daring M.D.   On: 07/21/2024 17:50    {Document cardiac monitor, telemetry assessment procedure when appropriate:32947} Procedures   Medications Ordered in the ED - No data to display    {Click here for ABCD2, HEART and other calculators REFRESH Note before signing:1}                              Medical Decision Making Amount and/or  Complexity of Data Reviewed Labs: ordered. Radiology: ordered.   ***  {Document critical care time when appropriate  Document review of labs and clinical decision tools ie CHADS2VASC2, etc  Document your independent review of radiology images and any outside records  Document your discussion with family members, caretakers and with consultants  Document social determinants of health affecting pt's care  Document your decision making why or why not admission, treatments were needed:32947:::1}   Final diagnoses:  None    ED Discharge Orders     None

## 2024-07-21 NOTE — ED Notes (Signed)
  at bedside

## 2024-07-21 NOTE — ED Notes (Signed)
 Pt had an episode of incontinence.  Pt cleaned, bed sheets changed, and new brief applied.

## 2024-07-22 ENCOUNTER — Observation Stay (HOSPITAL_COMMUNITY)

## 2024-07-22 ENCOUNTER — Ambulatory Visit

## 2024-07-22 ENCOUNTER — Telehealth: Payer: Self-pay | Admitting: Cardiology

## 2024-07-22 ENCOUNTER — Other Ambulatory Visit (HOSPITAL_COMMUNITY): Payer: Self-pay | Admitting: *Deleted

## 2024-07-22 DIAGNOSIS — N1832 Chronic kidney disease, stage 3b: Secondary | ICD-10-CM | POA: Insufficient documentation

## 2024-07-22 DIAGNOSIS — K2100 Gastro-esophageal reflux disease with esophagitis, without bleeding: Secondary | ICD-10-CM | POA: Diagnosis not present

## 2024-07-22 DIAGNOSIS — R531 Weakness: Principal | ICD-10-CM

## 2024-07-22 DIAGNOSIS — E119 Type 2 diabetes mellitus without complications: Secondary | ICD-10-CM | POA: Diagnosis not present

## 2024-07-22 DIAGNOSIS — R7989 Other specified abnormal findings of blood chemistry: Secondary | ICD-10-CM | POA: Insufficient documentation

## 2024-07-22 DIAGNOSIS — S06310D Contusion and laceration of right cerebrum without loss of consciousness, subsequent encounter: Secondary | ICD-10-CM

## 2024-07-22 DIAGNOSIS — I10 Essential (primary) hypertension: Secondary | ICD-10-CM | POA: Diagnosis not present

## 2024-07-22 DIAGNOSIS — Z515 Encounter for palliative care: Secondary | ICD-10-CM | POA: Diagnosis not present

## 2024-07-22 DIAGNOSIS — Z66 Do not resuscitate: Secondary | ICD-10-CM | POA: Diagnosis not present

## 2024-07-22 DIAGNOSIS — Z789 Other specified health status: Secondary | ICD-10-CM | POA: Diagnosis not present

## 2024-07-22 DIAGNOSIS — E785 Hyperlipidemia, unspecified: Secondary | ICD-10-CM | POA: Diagnosis not present

## 2024-07-22 DIAGNOSIS — R55 Syncope and collapse: Secondary | ICD-10-CM

## 2024-07-22 DIAGNOSIS — I5032 Chronic diastolic (congestive) heart failure: Secondary | ICD-10-CM | POA: Diagnosis not present

## 2024-07-22 DIAGNOSIS — S06321A Contusion and laceration of left cerebrum with loss of consciousness of 30 minutes or less, initial encounter: Secondary | ICD-10-CM | POA: Diagnosis not present

## 2024-07-22 DIAGNOSIS — F39 Unspecified mood [affective] disorder: Secondary | ICD-10-CM | POA: Insufficient documentation

## 2024-07-22 DIAGNOSIS — Z7189 Other specified counseling: Secondary | ICD-10-CM

## 2024-07-22 DIAGNOSIS — I6523 Occlusion and stenosis of bilateral carotid arteries: Secondary | ICD-10-CM | POA: Diagnosis not present

## 2024-07-22 LAB — ECHOCARDIOGRAM LIMITED
AR max vel: 0.61 cm2
AV Area VTI: 0.59 cm2
AV Area mean vel: 0.61 cm2
AV Mean grad: 17.3 mmHg
AV Peak grad: 28.4 mmHg
Ao pk vel: 2.67 m/s
Area-P 1/2: 1.83 cm2
MV VTI: 0.58 cm2
S' Lateral: 2.3 cm
Weight: 2370.39 [oz_av]

## 2024-07-22 LAB — TROPONIN T, HIGH SENSITIVITY
Troponin T High Sensitivity: 24 ng/L — ABNORMAL HIGH (ref 0–19)
Troponin T High Sensitivity: 27 ng/L — ABNORMAL HIGH (ref 0–19)

## 2024-07-22 LAB — COMPREHENSIVE METABOLIC PANEL WITH GFR
ALT: 14 U/L (ref 0–44)
AST: 22 U/L (ref 15–41)
Albumin: 3.8 g/dL (ref 3.5–5.0)
Alkaline Phosphatase: 68 U/L (ref 38–126)
Anion gap: 15 (ref 5–15)
BUN: 18 mg/dL (ref 8–23)
CO2: 28 mmol/L (ref 22–32)
Calcium: 8.6 mg/dL — ABNORMAL LOW (ref 8.9–10.3)
Chloride: 99 mmol/L (ref 98–111)
Creatinine, Ser: 1.5 mg/dL — ABNORMAL HIGH (ref 0.44–1.00)
GFR, Estimated: 35 mL/min — ABNORMAL LOW (ref >60)
Glucose, Bld: 134 mg/dL — ABNORMAL HIGH (ref 70–99)
Potassium: 3 mmol/L — ABNORMAL LOW (ref 3.5–5.1)
Sodium: 141 mmol/L (ref 135–145)
Total Bilirubin: 0.5 mg/dL (ref 0.0–1.2)
Total Protein: 6.8 g/dL (ref 6.5–8.1)

## 2024-07-22 LAB — CBC
HCT: 37.6 % (ref 36.0–46.0)
Hemoglobin: 11.4 g/dL — ABNORMAL LOW (ref 12.0–15.0)
MCH: 26.3 pg (ref 26.0–34.0)
MCHC: 30.3 g/dL (ref 30.0–36.0)
MCV: 86.8 fL (ref 80.0–100.0)
Platelets: 296 K/uL (ref 150–400)
RBC: 4.33 MIL/uL (ref 3.87–5.11)
RDW: 17.2 % — ABNORMAL HIGH (ref 11.5–15.5)
WBC: 5.7 K/uL (ref 4.0–10.5)
nRBC: 0 % (ref 0.0–0.2)

## 2024-07-22 LAB — PHOSPHORUS: Phosphorus: 3.6 mg/dL (ref 2.5–4.6)

## 2024-07-22 LAB — MRSA NEXT GEN BY PCR, NASAL: MRSA by PCR Next Gen: DETECTED — AB

## 2024-07-22 LAB — MAGNESIUM: Magnesium: 1.6 mg/dL — ABNORMAL LOW (ref 1.7–2.4)

## 2024-07-22 MED ORDER — MUPIROCIN 2 % EX OINT
TOPICAL_OINTMENT | Freq: Two times a day (BID) | CUTANEOUS | Status: DC
  Administered 2024-07-22: 1 via NASAL
  Filled 2024-07-22: qty 22

## 2024-07-22 MED ORDER — ESCITALOPRAM OXALATE 10 MG PO TABS
10.0000 mg | ORAL_TABLET | Freq: Every day | ORAL | Status: DC
Start: 2024-07-22 — End: 2024-07-24
  Administered 2024-07-22 – 2024-07-24 (×3): 10 mg via ORAL
  Filled 2024-07-22 (×3): qty 1

## 2024-07-22 MED ORDER — CHLORHEXIDINE GLUCONATE CLOTH 2 % EX PADS
6.0000 | MEDICATED_PAD | Freq: Every day | CUTANEOUS | Status: DC
  Administered 2024-07-22 – 2024-07-23 (×2): 6 via TOPICAL

## 2024-07-22 MED ORDER — TORSEMIDE 20 MG PO TABS: 40.0000 mg | ORAL_TABLET | ORAL | Status: DC

## 2024-07-22 MED ORDER — ATORVASTATIN CALCIUM 40 MG PO TABS
40.0000 mg | ORAL_TABLET | Freq: Every day | ORAL | Status: DC
  Administered 2024-07-22 – 2024-07-24 (×3): 40 mg via ORAL
  Filled 2024-07-22 (×3): qty 1

## 2024-07-22 MED ORDER — MAGNESIUM SULFATE 2 GM/50ML IV SOLN
2.0000 g | Freq: Once | INTRAVENOUS | Status: AC
  Administered 2024-07-22: 2 g via INTRAVENOUS
  Filled 2024-07-22: qty 50

## 2024-07-22 MED ORDER — PROCHLORPERAZINE EDISYLATE 10 MG/2ML IJ SOLN: 10.0000 mg | Freq: Four times a day (QID) | INTRAMUSCULAR | Status: DC | PRN

## 2024-07-22 MED ORDER — AMIODARONE HCL 200 MG PO TABS
200.0000 mg | ORAL_TABLET | Freq: Every day | ORAL | Status: DC
  Administered 2024-07-22 – 2024-07-24 (×3): 200 mg via ORAL
  Filled 2024-07-22 (×3): qty 1

## 2024-07-22 MED ORDER — GUAIFENESIN-DM 100-10 MG/5ML PO SYRP
5.0000 mL | ORAL_SOLUTION | ORAL | Status: DC | PRN
  Administered 2024-07-23: 5 mL via ORAL
  Filled 2024-07-22: qty 5

## 2024-07-22 MED ORDER — PANTOPRAZOLE SODIUM 40 MG PO TBEC
40.0000 mg | DELAYED_RELEASE_TABLET | Freq: Every day | ORAL | Status: DC
  Administered 2024-07-22 – 2024-07-24 (×3): 40 mg via ORAL
  Filled 2024-07-22 (×3): qty 1

## 2024-07-22 MED ORDER — POTASSIUM CHLORIDE CRYS ER 20 MEQ PO TBCR
20.0000 meq | EXTENDED_RELEASE_TABLET | Freq: Every day | ORAL | Status: DC
  Administered 2024-07-22: 20 meq via ORAL
  Filled 2024-07-22: qty 1

## 2024-07-22 MED ORDER — DAPAGLIFLOZIN PROPANEDIOL 10 MG PO TABS
10.0000 mg | ORAL_TABLET | Freq: Every morning | ORAL | Status: DC
  Administered 2024-07-22 – 2024-07-23 (×2): 10 mg via ORAL
  Filled 2024-07-22 (×2): qty 1

## 2024-07-22 MED ORDER — TORSEMIDE 20 MG PO TABS
20.0000 mg | ORAL_TABLET | ORAL | Status: DC
Start: 2024-07-22 — End: 2024-07-23
  Administered 2024-07-22: 20 mg via ORAL
  Filled 2024-07-22: qty 1

## 2024-07-22 MED ORDER — LEVOTHYROXINE SODIUM 100 MCG PO TABS
100.0000 ug | ORAL_TABLET | Freq: Every day | ORAL | Status: DC
  Administered 2024-07-22 – 2024-07-24 (×3): 100 ug via ORAL
  Filled 2024-07-22 (×3): qty 1

## 2024-07-22 NOTE — Progress Notes (Signed)
   Inpatient Rehab Admissions Coordinator :  Per MD recommendations patient was screened for CIR candidacy by Heron Leavell RN MSN. Patient does not appear to demonstrate the medical neccesity for a Hospital Rehabilitation /CIR admit.  Therapy is recommending HH. I will not place a Rehab Consult. Recommend other Rehab Venues to be pursued. Please contact me with any questions.  Heron Leavell RN MSN Admissions Coordinator [phone]

## 2024-07-22 NOTE — Care Management Obs Status (Signed)
 MEDICARE OBSERVATION STATUS NOTIFICATION   Patient Details  Name: [name] MRN: [id number] Date of Birth: 12-19-1945   Medicare Observation Status Notification Given:  Yes    Duwaine LITTIE Ada 07/22/2024, 4:42 PM

## 2024-07-22 NOTE — Evaluation (Signed)
 Occupational Therapy Evaluation Patient Details Name: [name] MRN: [id number] DOB: 05-Apr-1946 Today's Date: 07/22/2024   History of Present Illness   [name] is a 78 y.o. female with medical history significant of hypothyroidism, type 2 diabetes mellitus, COPD, HFpEF, A-fib/flutter on warfarin, rheumatic heart disease, AV and MV replacement, CVA, carotid artery stenosis, CKD-3B, lumbar laminectomy and compression and mild cognitive impairment who presents to the ED emergency department due to 4-day onset of worsening generalized weakness, subjective fever, nausea, diarrhea.  Patient usually ambulates within the house and without the need of any assistive device, but she has been requiring a rollator whereby she sits on it instead of walking with it due to weakness.  Husband reported a syncopal episode while in the ED     She was admitted to [facility] from 9/26 to 10/8 due to syncope and collapse in which she sustained a head trauma and was found to have intraparenchymal hemorrhage (she is on warfarin).  Repeated MRI reviewed by neurosurgery at that time was stable and there was no need for surgical intervention.     She was seen in the ED on 10/11 due to UTI and was discharged home with antibiotics.  She was doing fairly until she started to complain of weakness as described above. (per DO)     Clinical Impressions Pt agreeable to OT and PT co-evaluation. Pt was independent prior to recent fall. Since returning home from that fall pt has needed some assist for ADL's and required use of RW for ambulation. Today pt required min A for transfer to bed from chair without AD; poor balance noted without use of RW. Improved balance with use of RW during ambulation. Pt at level of set up to CGA for most ADL's while seated due to mild sitting balance deficits and impulsivity. Able to follow commands but husband reports pt has continued memory problems. Pt left in the chair with call bell within  reach and family present. Pt will benefit from continued OT in the hospital to increase strength, balance, and endurance for safe ADL's.        If plan is discharge home, recommend the following:   A little help with walking and/or transfers;A little help with bathing/dressing/bathroom;Assistance with cooking/housework;Assist for transportation;Help with stairs or ramp for entrance;Direct supervision/assist for medications management     Functional Status Assessment   Patient has had a recent decline in their functional status and demonstrates the ability to make significant improvements in function in a reasonable and predictable amount of time.     Equipment Recommendations   None recommended by OT             Precautions/Restrictions   Precautions Precautions: Fall Recall of Precautions/Restrictions: Impaired Restrictions Weight Bearing Restrictions Per Provider Order: No     Mobility Bed Mobility Overal bed mobility: Needs Assistance Bed Mobility: Sit to Supine, Supine to Sit     Supine to sit: Min assist Sit to supine: Min assist   General bed mobility comments: impulsive and labored movement; deficits in trunk control at times; difficulty lifting B LE into bed fully.    Transfers Overall transfer level: Needs assistance   Transfers: Sit to/from Stand, Bed to chair/wheelchair/BSC Sit to Stand: Min assist     Step pivot transfers: Min assist     General transfer comment: Chair to EOB with unsteady movement and some loss of balance.      Balance Overall balance assessment: Needs assistance Sitting-balance support: No  upper extremity supported, Feet supported Sitting balance-Leahy Scale: Fair Sitting balance - Comments: seated at EOB   Standing balance support: During functional activity, No upper extremity supported Standing balance-Leahy Scale: Poor Standing balance comment: fair using RW; poor without RW                            ADL either performed or assessed with clinical judgement   ADL Overall ADL's : Needs assistance/impaired     Grooming: Set up;Sitting   Upper Body Bathing: Set up;Sitting   Lower Body Bathing: Contact guard assist;Sitting/lateral leans   Upper Body Dressing : Set up;Sitting   Lower Body Dressing: Contact guard assist;Sitting/lateral leans Lower Body Dressing Details (indicate cue type and reason): Able to manage sock seated at EOB with CGA. Toilet Transfer: Minimal Cabin crew Details (indicate cue type and reason): Chair to EOB without AD Toileting- Clothing Manipulation and Hygiene: Contact guard assist;Sitting/lateral lean       Functional mobility during ADLs: Contact guard assist;Minimal assistance;Rolling walker (2 wheels) General ADL Comments: Able to ambulate in the hall with CGA to min A using RW.     Vision Baseline Vision/History: 1 Wears glasses Ability to See in Adequate Light: 0 Adequate Patient Visual Report: No change from baseline Vision Assessment?: No apparent visual deficits     Perception Perception: Not tested       Praxis Praxis: Not tested       Pertinent Vitals/Pain Pain Assessment Pain Assessment: No/denies pain Pain Score: 0-No pain     Extremity/Trunk Assessment Upper Extremity Assessment Upper Extremity Assessment: Overall WFL for tasks assessed   Lower Extremity Assessment Lower Extremity Assessment: Defer to PT evaluation   Cervical / Trunk Assessment Cervical / Trunk Assessment: Kyphotic   Communication Communication Communication: No apparent difficulties   Cognition Arousal: Alert Behavior During Therapy: WFL for tasks assessed/performed Cognition: History of cognitive impairments             OT - Cognition Comments: History of mild cognitive delay per chair; memory issues reported by husband.                 Following commands: Intact Following commands impaired: Follows one  step commands with increased time     Cueing  General Comments   Cueing Techniques: Verbal cues                 Home Living Family/patient expects to be discharged to:: Private residence Living Arrangements: Spouse/significant other Available Help at Discharge: Family;Available 24 hours/day Type of Home: House Home Access: Stairs to enter [employer] of Steps: 3 Entrance Stairs-Rails: Right;Left Home Layout: Two level;Able to live on main level with bedroom/bathroom Alternate Level Stairs-Number of Steps: Flight   Bathroom Shower/Tub: Producer, television/film/video: Handicapped height Bathroom Accessibility: Yes How Accessible: Accessible via walker Home Equipment: Shower seat - built in;Grab bars - tub/shower;Rolling Environmental consultant (2 wheels);Rollator (4 wheels);Wheelchair - manual;Other (comment) (3 point cane)          Prior Functioning/Environment Prior Level of Function : Needs assist;History of Falls (last six months)       Physical Assist : ADLs (physical);Mobility (physical) Mobility (physical): Bed mobility;Transfers;Gait;Stairs ADLs (physical): Bathing;Dressing;Toileting;IADLs Mobility Comments: Was ambulating household distances using 3 point cane, but  has been using RW recently due to falls (as per PT note) ADLs Comments: Pt was independent prior to her recent fall; since being home she has needed assist  for bathing, dressing, and PRN assist for toileting.    OT Problem List: Decreased strength;Decreased activity tolerance;Impaired balance (sitting and/or standing);Decreased cognition;Decreased safety awareness;Decreased knowledge of use of DME or AE   OT Treatment/Interventions: Self-care/ADL training;Therapeutic exercise;DME and/or AE instruction;Therapeutic activities;Patient/family education;Balance training;Cognitive remediation/compensation      OT Goals(Current goals can be found in the care plan section)   Acute Rehab OT Goals Patient  Stated Goal: return home OT Goal Formulation: With patient/family Time For Goal Achievement: 08/05/24 Potential to Achieve Goals: Good   OT Frequency:  Min 2X/week    Co-evaluation PT/OT/SLP Co-Evaluation/Treatment: Yes Reason for Co-Treatment: To address functional/ADL transfers PT goals addressed during session: Mobility/safety with mobility;Balance;Proper use of DME OT goals addressed during session: ADL's and self-care      AM-PAC OT 6 Clicks Daily Activity     Outcome Measure Help from another person eating meals?: None Help from another person taking care of personal grooming?: A Little Help from another person toileting, which includes using toliet, bedpan, or urinal?: A Little Help from another person bathing (including washing, rinsing, drying)?: A Little Help from another person to put on and taking off regular upper body clothing?: A Little Help from another person to put on and taking off regular lower body clothing?: A Little 6 Click Score: 19   End of Session Equipment Utilized During Treatment: Gait belt;Rolling walker (2 wheels);Oxygen  (Placed on 2.5 L at end of session again due to desatruation. At 91% by end of session while on supplemental O2.)  Activity Tolerance: Patient tolerated treatment well Patient left: in chair;with call bell/phone within reach  OT Visit Diagnosis: Unsteadiness on feet (R26.81);Muscle weakness (generalized) (M62.81);Other symptoms and signs involving cognitive function;Low vision, both eyes (H54.2);History of falling (Z91.81)                Time: 9086-9067 OT Time Calculation (min): 19 min Charges:  OT General Charges $OT Visit: 1 Visit OT Evaluation $OT Eval Low Complexity: 1 Low  Bentleigh Waren OT, MOT   Jayson Person 07/22/2024, 3:03 PM

## 2024-07-22 NOTE — Telephone Encounter (Signed)
 Noted.  Appt cancelled.

## 2024-07-22 NOTE — Telephone Encounter (Signed)
 Patient called to report she is in the hospital and will not be at her Coumadin  appointment today.

## 2024-07-22 NOTE — Progress Notes (Addendum)
 PROGRESS NOTE    Patient: [name]                            PCP: Sheryle Carwin, MD                    DOB: Nov 15, 1945            DOA: 07/21/2024 [id number]             DOS: 07/22/2024, 1:55 PM   LOS: 0 days   Date of Service: The patient was seen and examined on 07/22/2024  Subjective:   The patient was seen and examined this morning. Satting 99% on 4 L of oxygen , pulse 57, RR 30, blood pressure 123/65 unsteady gait, generalized  Pleasantly confused no further syncopal episodes overnight  Brief Narrative:    [name] is a 78 y.o. female with medical history significant of hypothyroidism, type 2 diabetes mellitus, COPD, HFpEF, A-fib/flutter on warfarin, rheumatic heart disease, AV and MV replacement, CVA, carotid artery stenosis, CKD-3B, lumbar laminectomy and compression and mild cognitive impairment who presents to the ED emergency department due to 4-day onset of worsening generalized weakness, subjective fever, nausea, diarrhea.  Patient usually ambulates within the house and without the need of any assistive device, but she has been requiring a rollator whereby she sits on it instead of walking with it due to weakness. Husband reported a syncopal episode while in the ED   She was admitted to [facility] from 9/26 to 10/8 due to syncope and collapse in which she sustained a head trauma and was found to have intraparenchymal hemorrhage (she is on warfarin).  Repeated MRI reviewed by neurosurgery at that time was stable and there was no need for surgical intervention.   She was seen in the ED on 10/11 due to UTI and was discharged home with antibiotics.  She was doing fairly until she started to complain of weakness as described above.   ED Course:  sShe was hemodynamically stable.  Workup in the ED showed normocytic anemia.  BMP was normal except for chloride of 97, glucose 113, creatinine 1.58.  D-dimer 2.15, urinalysis was normal.  Influenza A, B, SARS coronavirus 2,  RSV was negative. CT angiography of chest showed no pulmonary embolism. Chronic bilateral pleural effusions calcified plaques suggestive of prior asbestos exposure. Indeterminate chronic stable right hilar, left hilar, and prevascular mediastinal lymphadenopathy. Chest x-ray shows stable chest.  No acute process Patient was treated with calcium  carbonate.  TRH was asked to admit patient.    Assessment & Plan:   Active Problems:   Gastroesophageal reflux disease   Mixed hyperlipidemia   Essential hypertension   Acquired hypothyroidism   Syncope   Intraparenchymal hematoma of brain (HCC)   Generalized weakness   Elevated d-dimer   Chronic kidney disease, stage 3b (HCC)   Chronic heart failure with preserved ejection fraction (HFpEF) (HCC)   Mood disorder  Syncope Continue telemetry and watch for arrhythmias Troponins >>> 24, 27,-remaining flat, denies any chest pain Echocardiogram done on 06/29/2024 showed EF of 77 5%.  No WMA.  LV diastolic parameters indeterminate. CT angiogram -negative PE, CAD, chronic BL pleural effusion-calcified plaques suggestive of prior asbestos exposure.  Prevascular lymphadenopathy   Echocardiogram >>>  Carotid artery Dopplers >>> Results from 07/02/2024 Carotid US  at [facility] Lab Summary:   Right Carotid: Velocities in the right ICA are consistent with a 1-39%  stenosis.  Left Carotid: Velocities in the left ICA are consistent with a 1-39% stenosis.  Vertebrals: Bilateral vertebral arteries demonstrate antegrade flow.  Subclavians: Normal flow hemodynamics were seen in bilateral subclavian  arteries.    Generalized weakness w ?  Syncopal episodes Protein supplement will be provided -Continue telemonitoring, Continue fall precaution Continue PT/OT eval and treat   Supratherapeutic INR INR 3.7, warfarin will be held at this time -With recent history of fall, with subdural hematoma-holding Coumadin    Hypomagnesemia Magnesium  1.6, this will be  replenished   Prolonged QT interval QTc 524 ms Avoid QT prolonging drugs Magnesium  was 1.6, this was replenished Repeat EKG in the morning   Elevated D-dimer D-dimer 2.15 CT angiograph of chest ruled out pulmonary embolism   CKD 3B Creatinine 1.58 (creatinine is within baseline range) Renally adjust medications, avoid nephrotoxic agents/dehydration/hypotension   History of left intraparenchymal hemorrhage/SAH Stable. INR elevated at 3.7,, holding Coumadin    Chronic HFpEF Continue total input/output, daily weights and fluid restriction Continue heart healthy diet      Continue home torsemide , Farxiga  Bisoprolol  temporarily held due to prolonged PR interval TTE done on 06/29/2024 showed EF of 70-75%.  No RWMA.  Indeterminate LV diastolic parameters.   Paroxysmal A-fib/flutter Continue amiodarone  Bisoprolol  will be temporarily held due to prolonged PR interval (347 ms) Warfarin will be held at this time due to supratherapeutic INR, and subdural hematoma   Essential hypertension (controlled) Continue home torsemide  Continue Klor-Con    Acquired hypothyroidism- Continue home Synthroid    GERD - Continue PPI   Mixed hyperlipidemia - Continue Lipitor   Mood disorder - Continue Lexapro .  Ethics full code: Due to multiple comorbidities, steady decline, noted for poor prognosis Consulting palliative care, determine goals of care and CODE STATUS    ----------------------------------------------------------------------------------------------------------------------------------------------- Nutritional status:  The patient's BMI is: Body mass index is 26.24 kg/m. I agree with the assessment and plan as outlined ------------------------------------------------------------------------------------------------------------------------------------------------  DVT prophylaxis:  SCDs Start: 07/21/24 2252   Code Status:   Code Status: Full Code  Family Communication: Husband  present at bedside-updated -Advance care planning has been discussed.   Admission status:   Status is: Observation The patient remains OBS appropriate and will d/c before 2 midnights.   Disposition: From  - home             Planning for discharge in 1-2 days   Procedures:   No admission procedures for hospital encounter.   Antimicrobials:  Anti-infectives (From admission, onward)    None        Medication:   amiodarone   200 mg Oral Daily   atorvastatin   40 mg Oral Daily   Chlorhexidine  Gluconate Cloth  6 each Topical Daily   dapagliflozin  propanediol  10 mg Oral q AM   escitalopram   10 mg Oral Q breakfast   feeding supplement  237 mL Oral BID BM   levothyroxine   100 mcg Oral Q0600   mupirocin  ointment   Nasal BID   pantoprazole   40 mg Oral QAC breakfast   potassium chloride  SA  20 mEq Oral Daily   torsemide   20 mg Oral QODAY   [START ON 07/23/2024] torsemide   40 mg Oral QODAY    acetaminophen  **OR** acetaminophen , prochlorperazine    Objective:   Vitals:   07/22/24 0819 07/22/24 0921 07/22/24 1000 07/22/24 1200  BP: 123/65  (!) 148/30   Pulse: (!) 57 (!) 57 (!) 57   Resp: (!) 23 (!) 30 20   Temp:    97.7 F (  36.5 C)  TempSrc:    Oral  SpO2: 100% 99% 100%   Weight:        Intake/Output Summary (Last 24 hours) at 07/22/2024 1355 Last data filed at 07/22/2024 1300 Gross per 24 hour  Intake --  Output 850 ml  Net -850 ml   Filed Weights   07/21/24 1627 07/21/24 2150  Weight: 69.9 kg 67.2 kg     Physical examination:   General:  AAO x 1,  cooperative, no distress;   HEENT:  Normocephalic, PERRL, otherwise with in Normal limits   Neuro:  CNII-XII intact. , normal motor and sensation, reflexes intact   Lungs:   Clear to auscultation BL, Respirations unlabored,  No wheezes / crackles  Cardio:    S1/S2, RRR, No murmure, No Rubs or Gallops   Abdomen:  Soft, non-tender, bowel sounds active all four quadrants, no guarding or peritoneal signs.   Muscular  skeletal:  Limited exam -global generalized weaknesses - in bed, able to move all 4 extremities,   2+ pulses,  symmetric, No pitting edema  Skin:  Dry, warm to touch, negative for any Rashes,  Wounds: Please see nursing documentation       ------------------------------------------------------------------------------------------------------------------------------------------    LABs:     Latest Ref Rng & Units 07/22/2024   12:45 AM 07/21/2024    5:02 PM 07/13/2024    4:16 PM  CBC  WBC 4.0 - 10.5 K/uL 5.7  5.3  6.6   Hemoglobin 12.0 - 15.0 g/dL 88.5  88.4  88.0   Hematocrit 36.0 - 46.0 % 37.6  37.9  39.2   Platelets 150 - 400 K/uL 296  304  260       Latest Ref Rng & Units 07/22/2024   12:45 AM 07/21/2024    5:02 PM 07/13/2024    4:16 PM  CMP  Glucose 70 - 99 mg/dL 865  886  879   BUN 8 - 23 mg/dL 18  20  18    Creatinine 0.44 - 1.00 mg/dL 8.49  8.41  8.34   Sodium 135 - 145 mmol/L 141  137  140   Potassium 3.5 - 5.1 mmol/L 3.0  3.7  3.5   Chloride 98 - 111 mmol/L 99  97  100   CO2 22 - 32 mmol/L 28  28  25    Calcium  8.9 - 10.3 mg/dL 8.6  8.9  8.6   Total Protein 6.5 - 8.1 g/dL 6.8  7.1  7.1   Total Bilirubin 0.0 - 1.2 mg/dL 0.5  0.4  0.6   Alkaline Phos 38 - 126 U/L 68  69  85   AST 15 - 41 U/L 22  28  26    ALT 0 - 44 U/L 14  18  20         Micro Results:  .  Reviewed, urine culture from 07/13/2024 positive for KlebsiellaResistant to ampicillin , nitrofurantoin   Radiology Reports CT Angio Chest PE W and/or Wo Contrast Result Date: 07/21/2024 EXAM: CTA CHEST 07/21/2024 07:16:17 PM TECHNIQUE: CTA of the chest was performed after the administration of intravenous contrast. Multiplanar reformatted images are provided for review. MIP images are provided for review. Automated exposure control, iterative reconstruction, and/or weight based adjustment of the mA/kV was utilized to reduce the radiation dose to as low as reasonably achievable. COMPARISON: CT  angiography chest 08:30 11/10/2020 CLINICAL HISTORY: Pulmonary embolism (PE) suspected, low to intermediate prob, positive D-dimer. Table formatting from the original note was not included.; BIB family from  home for subjective fever, nausea, non-productive cough, diarrhea and body aches. Rates pain 4/10. Denies other sx. No meds PTA. Steady gait. Alert, NAD, calm, interactive, resps e/u, speaking in clear complete sentences. FINDINGS: PULMONARY ARTERIES: Pulmonary arteries are adequately opacified for evaluation. No acute pulmonary embolus. Main pulmonary artery is normal in caliber. MEDIASTINUM: Aortic and mitral annular valve replacements. Prominent heart size. 4-vessel coronary artery calcification. Severe atherosclerotic plaque of the thoracic aorta. The pericardium demonstrates no acute abnormality. LYMPH NODES: Chronic stable Right hilar lymphadenopathy measuring up to 1.1 cm. Chronic stable Left hilar lymphadenopathy measuring up to 1.2 cm. Chronic stable Prevascular mediastinal lymphadenopathy measuring up to 1 cm. No axillary lymphadenopathy. LUNGS AND PLEURA: Biapical pleural/pulmonary scarring. Pleural calcifications noted bilaterally. Chronic stable Subpleural left lower lobe pulmonary micronodule (series 6, image 57) -no further follow-up indicated . Subpleural triangular micronodule along the right minor fissure likely an intrapulmonary lymph node no further follow-up indicated . Reticulations along the right anterior upper lobe likely sequelae of prior radiation therapy. No evidence of pleural effusion or pneumothorax. UPPER ABDOMEN: Colonic diverticulosis. SOFT TISSUES AND BONES: Right breast surgical changes suggestive of lumpectomy. No acute bone or soft tissue abnormality. IMPRESSION: 1. No pulmonary embolism. 2. Chronic bilateral pleural effusions calcified plaques suggestive of prior asbestos exposure. 3. Indeterminate chronic stable right hilar, left hilar, and prevascular mediastinal  lymphadenopathy. 4. Severe atherosclerotic plaque of the thoracic aorta and 4-vessel coronary artery calcification. Electronically signed by: Morgane Naveau MD 07/21/2024 07:32 PM EDT RP Workstation: [id number]   DG Chest 2 View Result Date: 07/21/2024 CLINICAL DATA:  Short of breath, fever EXAM: CHEST - 2 VIEW COMPARISON:  06/30/2024 FINDINGS: Frontal and lateral views of the chest demonstrates stable postsurgical changes from median sternotomy and mitral and aortic valve replacement. The cardiac silhouette is enlarged but stable. Chronic central vascular congestion without airspace disease, effusion, or pneumothorax. No acute bony abnormalities. IMPRESSION: 1. Stable chest, no acute process. Electronically Signed   By: Ozell Daring M.D.   On: 07/21/2024 17:50    SIGNED: Adriana DELENA Grams, MD, FHM. FAAFP. [name] - [facility] Time spent - 55 min.  In seeing, evaluating and examining the patient. Reviewing medical records, labs, drawn plan of care. [facility],  Pager (please use [email or web address] to page/ text) Please use Epic Secure Chat for non-urgent communication (7AM-7PM)  If 7PM-7AM, please contact night-coverage [email or web address], 07/22/2024, 1:55 PM

## 2024-07-22 NOTE — Plan of Care (Signed)
  Problem: Acute Rehab PT Goals(only PT should resolve) Goal: Pt Will Go Supine/Side To Sit Outcome: Progressing Flowsheets (Taken 07/22/2024 1229) Pt will go Supine/Side to Sit:  with supervision  with contact guard assist Goal: Patient Will Transfer Sit To/From Stand Outcome: Progressing Flowsheets (Taken 07/22/2024 1229) Patient will transfer sit to/from stand:  with supervision  with contact guard assist Goal: Pt Will Transfer Bed To Chair/Chair To Bed Outcome: Progressing Flowsheets (Taken 07/22/2024 1229) Pt will Transfer Bed to Chair/Chair to Bed:  with supervision  with contact guard assist Goal: Pt Will Ambulate Outcome: Progressing Flowsheets (Taken 07/22/2024 1229) Pt will Ambulate:  75 feet  with supervision  with contact guard assist  with rolling walker   12:30 PM, 07/22/24 Lynwood Music, MPT Physical Therapist with [facility] [phone] office [phone] mobile phone

## 2024-07-22 NOTE — Hospital Course (Addendum)
 [name] is a 78 y.o. female with medical history significant of hypothyroidism, type 2 diabetes mellitus, COPD, HFpEF, A-fib/flutter on warfarin, rheumatic heart disease, AV and MV replacement, CVA, carotid artery stenosis, CKD-3B, lumbar laminectomy and compression and mild cognitive impairment who presents to the ED emergency department due to 4-day onset of worsening generalized weakness, subjective fever, nausea, diarrhea.  Patient usually ambulates within the house and without the need of any assistive device, but she has been requiring a rollator whereby she sits on it instead of walking with it due to weakness. Husband reported a syncopal episode while in the ED   She was admitted to [facility] from 9/26 to 10/8 due to syncope and collapse in which she sustained a head trauma and was found to have intraparenchymal hemorrhage (she is on warfarin).  Repeated MRI reviewed by neurosurgery at that time was stable and there was no need for surgical intervention.   She was seen in the ED on 10/11 due to UTI and was discharged home with antibiotics.  She was doing fairly until she started to complain of weakness as described above.   ED Course:  sShe was hemodynamically stable.  Workup in the ED showed normocytic anemia.  BMP was normal except for chloride of 97, glucose 113, creatinine 1.58.  D-dimer 2.15, urinalysis was normal.  Influenza A, B, SARS coronavirus 2, RSV was negative. CT angiography of chest showed no pulmonary embolism. Chronic bilateral pleural effusions calcified plaques suggestive of prior asbestos exposure. Indeterminate chronic stable right hilar, left hilar, and prevascular mediastinal lymphadenopathy. Chest x-ray shows stable chest.  No acute process Patient was treated with calcium  carbonate.  TRH was asked to admit patient.    Assessment & Plan:   Active Problems:   Gastroesophageal reflux disease   Mixed hyperlipidemia   Essential hypertension   Acquired  hypothyroidism   Syncope   Intraparenchymal hematoma of brain (HCC)   Generalized weakness   Elevated d-dimer   Chronic kidney disease, stage 3b (HCC)   Chronic heart failure with preserved ejection fraction (HFpEF) (HCC)   Mood disorder  Syncope Continue telemetry and watch for arrhythmias Troponins >>> 24, 27,-remaining flat, denies any chest pain Echocardiogram done on 06/29/2024 showed EF of 77 5%.  No WMA.  LV diastolic parameters indeterminate. CT angiogram -negative PE, CAD, chronic BL pleural effusion-calcified plaques suggestive of prior asbestos exposure.  Prevascular lymphadenopathy   Echocardiogram >>>  Carotid artery Dopplers >>> Results from 07/02/2024 Carotid US  at [facility] Summary:   Right Carotid: Velocities in the right ICA are consistent with a 1-39%  stenosis.  Left Carotid: Velocities in the left ICA are consistent with a 1-39% stenosis.  Vertebrals: Bilateral vertebral arteries demonstrate antegrade flow.  Subclavians: Normal flow hemodynamics were seen in bilateral subclavian  arteries.    Generalized weakness w ?  Syncopal episodes Protein supplement will be provided Continue fall precaution Consulted PT OT, recommended home health-has been arranged   Supratherapeutic INR INR 3.7, warfarin will be held at this time -Checking INR daily, resuming Coumadin  with INR goal 2-3 -With recent history of fall, with subdural hematoma-holding Coumadin    Hypomagnesemia Magnesium  1.6, this will be replenished   Prolonged QT interval QTc 524 ms Avoid QT prolonging drugs Magnesium  was 1.6, this was replenished Repeat EKG needed   Elevated D-dimer D-dimer 2.15 CT angiograph of chest ruled out pulmonary embolism   CKD 3B -Stable Creatinine 1.58 (creatinine is within baseline range) >>> 1.30 Renally adjust medications,  avoid nephrotoxic agents/dehydration/hypotension   History of left intraparenchymal hemorrhage/SAH Stable. INR elevated at 3.7,, check  an INR, goal 2-3 Pharmacy consulted resuming Coumadin    Chronic HFpEF Continue total input/output, daily weights and fluid restriction Continue heart healthy diet      Continue home torsemide , Farxiga  -Full dose torsemide  40 mg, with 40 EQ of KCl today  Bisoprolol  temporarily held due to prolonged PR interval TTE done on 06/29/2024 showed EF of 70-75%.  No RWMA.  Indeterminate LV diastolic parameters.   Paroxysmal A-fib/flutter Continue amiodarone  100 mg daily heart rate, 56 Bisoprolol  will be temporarily held due to prolonged PR interval (347 ms) Warfarin will be held at this time due to supratherapeutic INR, and subdural hematoma   Essential hypertension (controlled) Continue home torsemide  Continue Klor-Con    Acquired hypothyroidism- Continue home Synthroid    GERD - Continue PPI   Mixed hyperlipidemia - Continue Lipitor   Mood disorder - Continue Lexapro .

## 2024-07-22 NOTE — TOC Initial Note (Signed)
 Transition of Care [facility]) - Initial/Assessment Note    Patient Details  Name: [name] MRN: [id number] Date of Birth: 08/09/1946  Transition of [facility]) CM/SW Contact:    Hoy DELENA Bigness, LCSW Phone Number: 07/22/2024, 1:24 PM  Clinical Narrative:                 Pt from home with spouse. Pt recently discharged home from [facility] on 07/10/24 w/ HH services through [place]. Confirmed w/ Joane pt is still active with their agency for HHPT/OT/RN/SW- ROC orders will need to be placed prior to discharge. Pt's spouse requests for a BSC to be ordered for pt. BSC will be ordered once DME order placed.  Pt not on home O2 but, is currently requiring 4L. CSW will follow for O2 need at discharge.   Expected Discharge Plan: Home w Home Health Services Barriers to Discharge: Continued Medical Work up   Patient Goals and CMS Choice Patient states their goals for this hospitalization and ongoing recovery are:: To return home CMS Medicare.gov Compare Post Acute Care list provided to:: Patient Choice offered to / list presented to : Patient [place] ownership interest in [facility].provided to::  (NA)    Expected Discharge Plan and Services In-house Referral: Clinical Social Work Discharge Planning Services: NA Post Acute Care Choice: Home Health, Durable Medical Equipment Living arrangements for the past 2 months: Single Family Home                                      Prior Living Arrangements/Services Living arrangements for the past 2 months: Single Family Home Lives with:: Spouse Patient language and need for interpreter reviewed:: Yes Do you feel safe going back to the place where you live?: Yes      Need for Family Participation in Patient Care: Yes (Comment) Care giver support system in place?: Yes (comment) Current home services: DME, Home OT, Home PT, Home RN (RW, rollator, HHPT/OT/RN/SW a/ [facility]) Criminal Activity/Legal Involvement Pertinent to Current  Situation/Hospitalization: No - Comment as needed  Activities of Daily Living      Permission Sought/Granted Permission sought to share information with : Facility Medical sales representative, Family Supports Permission granted to share information with : Yes, Verbal Permission Granted  Share Information with NAME: Lithzy Bernard  Permission granted to share info w AGENCY: HH/DME  Permission granted to share info w Relationship: Spouse  Permission granted to share info w Contact Information: [phone]  Emotional Assessment Appearance:: Appears stated age Attitude/Demeanor/Rapport: Engaged Affect (typically observed): Accepting Orientation: : Oriented to Self, Oriented to Place, Oriented to Situation Alcohol / Substance Use: Not Applicable Psych Involvement: No (comment)  Admission diagnosis:  Syncope [R55] Generalized weakness [R53.1] Syncope, unspecified syncope type [R55] Patient Active Problem List   Diagnosis Date Noted   Generalized weakness 07/22/2024   Elevated d-dimer 07/22/2024   Chronic kidney disease, stage 3b (HCC) 07/22/2024   Chronic heart failure with preserved ejection fraction (HFpEF) (HCC) 07/22/2024   Mood disorder 07/22/2024   Fracture of orbital floor, closed (HCC) 06/29/2024   Injury of face 06/29/2024   Lip laceration 06/29/2024   Chin laceration 06/29/2024   Intraparenchymal hematoma of brain (HCC) 06/28/2024   Heart failure with recovered ejection fraction (HFrecEF) (HCC) 06/28/2024   Atypical atrial flutter (HCC) 01/04/2023   Syncope 11/11/2022   Acute respiratory failure with hypoxia (HCC) 03/31/2022   Acute congestive heart failure (  HCC) 03/31/2022   Pneumonia 03/31/2022   RSV infection 06/03/2021   COPD with acute exacerbation (HCC) 06/01/2021   Type 2 diabetes mellitus treated without insulin  (HCC) 06/01/2021   COPD exacerbation (HCC) 06/01/2021   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   Asthma exacerbation 11/12/2016   Synovial cyst of  lumbar spine 05/13/2014   Encounter for therapeutic drug monitoring 11/06/2013   Hyponatremia    Atrial fibrillation (HCC)    Moderate aortic stenosis 07/12/2013   Moderate aortic insufficiency 07/12/2013   S/P MVR (mitral valve replacement) 07/12/2013   Severe mitral valve stenosis 07/09/2013   Mitral stenosis 07/08/2013   Chest pain 07/07/2013   Asthma, cough variant 03/27/2013   Upper airway cough syndrome in pt with mild cough variant asthma  09/17/2012   Dyspnea on exertion 06/13/2012   Edema 06/13/2012   Rheumatic heart disease 06/13/2012   Fasting hyperglycemia    Chronic anticoagulation 01/26/2012   Acquired hypothyroidism 01/23/2012   Cardioembolic stroke (HCC) 01/02/2012   Mixed hyperlipidemia    Essential hypertension    Breast carcinoma (HCC)    Gastroesophageal reflux disease    PCP:  Sheryle Carwin, MD Pharmacy:   [facility] [place], [place] - [address] [place] [address] Phone: [phone] Fax: [phone]  [name] [address] [place] [address] Phone: [phone] Fax: [phone]     Social Drivers of Health (SDOH) Social History: SDOH Screenings   Food Insecurity: Unknown (07/21/2024)  Housing: Unknown (07/21/2024)  Transportation Needs: No Transportation Needs (07/21/2024)  Utilities: Not At Risk (07/21/2024)  Social Connections: Unknown (07/21/2024)  Tobacco Use: Low Risk  (07/21/2024)   SDOH Interventions:     Readmission Risk Interventions    07/10/2024    2:18 PM 11/14/2022   11:52 AM  Readmission Risk Prevention Plan  Transportation Screening Complete Complete  PCP or Specialist Appt within 3-5 Days  Complete  HRI or Home Care Consult  Complete  Social Work Consult for Recovery Care Planning/Counseling  --  Palliative Care Screening  Not Applicable  Medication Review Oceanographer) Complete Complete  PCP or Specialist appointment within 3-5  days of discharge Complete   HRI or Home Care Consult Complete   SW Recovery Care/Counseling Consult Complete   Palliative Care Screening Not Applicable   Skilled Nursing Facility Patient Refused

## 2024-07-22 NOTE — Progress Notes (Signed)
*  PRELIMINARY RESULTS* Echocardiogram 2D Echocardiogram has been performed.  [name] 07/22/2024, 2:11 PM

## 2024-07-22 NOTE — Plan of Care (Signed)
  Problem: Acute Rehab OT Goals (only OT should resolve) Goal: Pt. Will Perform Grooming Flowsheets (Taken 07/22/2024 1509) Pt Will Perform Grooming: with modified independence Goal: Pt. Will Perform Lower Body Dressing Flowsheets (Taken 07/22/2024 1509) Pt Will Perform Lower Body Dressing: with modified independence Goal: Pt. Will Transfer To Toilet Flowsheets (Taken 07/22/2024 1509) Pt Will Transfer to Toilet: with modified independence Goal: Pt. Will Perform Toileting-Clothing Manipulation Flowsheets (Taken 07/22/2024 1509) Pt Will Perform Toileting - Clothing Manipulation and hygiene: with modified independence  Ledon Weihe OT, MOT

## 2024-07-22 NOTE — Consult Note (Addendum)
 Palliative Care Consult Note                                  Date: 07/22/2024   Patient Name: [name]  DOB: 12-13-1945  MRN: [id number]  Age / Sex: 78 y.o., female  PCP: Sheryle Carwin, MD Referring Physician: [name], MD  Reason for Consultation: Establishing goals of care  Past Medical History:  Diagnosis Date   Anemia    Asthma 2010   Atrial fibrillation (HCC)    Atrial flutter (HCC)    Breast carcinoma (HCC) 1995   1995   Cardioembolic stroke (HCC) 01/2012   Right frontal in 01/2012; normal carotid ultrasound; possible LAA thrombus by TEE; virtual complete neurologic recovery   Carotid artery stenosis 1961   Chronic kidney disease, stage 2, mildly decreased GFR    GFR of approximately 60   Diabetes mellitus without complication (HCC) 5+ years ago   Controlled most of time   Diverticulosis of colon (without mention of hemorrhage) 2012   Dr. Golda   Fasting hyperglycemia    120 fasting   Gastroesophageal reflux disease    Gastroparesis    Heart failure with improved ejection fraction (HFimpEF) (HCC) 5+ years   Heart murmur 1961   Hemorrhoids    History of aortic valve replacement with bioprosthetic valve    History of mitral valve replacement with bioprosthetic valve    Hyperlipidemia    Hypertension    pt denies 05/30/13     Dr Alvan chester   Hyponatremia    Hypothyroidism    Rheumatic heart disease    Shortness of breath    Syncope and collapse February, 2024    Subjective:   This NP Camellia Kays reviewed medical records, received report from team, assessed the patient and then meet at the patient's bedside to discuss diagnosis, prognosis, GOC, EOL wishes disposition and options.  Before meeting with the patient/family, I spent time reviewing the chart notes including ED provider notes, ED nursing notes, admission H&P, family medicine note from today, PT note from today, TOC note from today. I  also reviewed vital signs, nursing flowsheets, medication administrations record, labs, and imaging. Labs reviewed include high-sensitivity troponin T which is mildly elevated but stable at 24-27 in the setting of syncopal episode of unknown etiology.  CBC shows normal white count indicative of no infection, stable hemoglobin though mildly low at 11.4 today, drop in potassium from 3.7 yesterday on admission to 3.0 today, stable/near baseline creatinine at 1.5 today on the setting of progressive weakness and syncopal episode of unknown etiology.  Continued workup of possible cardiac etiologies occluding echocardiogram completed today, cardiology consult.  I met with the patient at bedside, her spouse [name] was present.  I was joined by Sara Kemps, PA also from the palliative medicine team.   We meet to discuss diagnosis prognosis, GOC, EOL wishes, disposition and options. Concept of Palliative Care was introduced as specialized medical care for people and their families living with serious illness.  If focuses on providing relief from the symptoms and stress of a serious illness.  The goal is to improve quality of life for both the patient and the family. Values and goals of care important to patient and family were attempted to be elicited.  Created space and opportunity for patient  and family to explore thoughts and feelings regarding current medical situation   Natural trajectory  and current clinical status were discussed. Questions and concerns addressed. Patient  encouraged to call with questions or concerns.    Patient/Family Understanding of Illness: The patient and her husband have a good understanding of her current clinical situation.  Reason she fell in September 2025 and was brought to [facility] for a small brain bleed and was able to discharge home.  Then she had a UTI which set her back some, subsequently improved.  Now she has been having weakness that is brought her back to the  hospital.  Husband notes she had a spell in the emergency department where her eyes rolled back in her head and she was not arousable for a brief time.  He states now that, thinking back to when she fell, she seemed to have a similar presentations leading up to that.  We spent time talking about details about her clinical condition and situation including acute presentation as well as chronic illnesses and details leading up to her admission.  Life Review: The patient lives with her husband, enjoys gardening and shopping.  Patient Values: Quality of life  Baseline Status: Previously she was functionally independent, had a good appetite, was able to care for herself.  Today's Discussion: In addition to discussion described above we had extensive discussion of various topics.  We spent time talking about her clinical situation, ongoing workup for etiology behind her sudden weakness and syncopal episode.  Just completed an echocardiogram, awaiting results.  Cardiology has been consulted as well.  PT and OT are evaluating her.  Patient and husband state that she had home health PT/OT prior to coming to the hospital and would like to continue that when she is discharged home.  We talked about the fear and uncertainty associated with her syncopal episodes.  She is hopeful to be able to figure out was going on, work on her weakness and try to get stronger so she can return home near her at her baseline functional independence.  We spent time talk about CODE STATUS.  After significant discussion the patient states that she and her husband previously talked about advance care planning and have a living well.  Patient's husband will try to bring the living will while she is admitted at the hospital so we can scanned this in our system.  She states that she desires to be DNR/DNI.  Goals: To get back to baseline or as close as possible including self-care, enjoyment of home activities.  Review of Systems   Constitutional:        Denies pain in general  Respiratory:  Negative for chest tightness and shortness of breath.   Cardiovascular:  Negative for chest pain.  Gastrointestinal:  Negative for abdominal pain, nausea and vomiting.  Neurological:  Positive for weakness.    Objective:   Primary Diagnoses: Present on Admission:  Syncope  Gastroesophageal reflux disease  Essential hypertension  Acquired hypothyroidism  Mixed hyperlipidemia  Intraparenchymal hematoma of brain (HCC)   Vital Signs:  BP (!) 148/30   Pulse (!) 57   Temp 97.7 F (36.5 C) (Oral)   Resp 20   Wt 67.2 kg   SpO2 100%   BMI 26.24 kg/m   Physical Exam Vitals and nursing note reviewed.  Constitutional:      General: She is not in acute distress.    Appearance: She is ill-appearing.     Comments: Appears a bit weak  Cardiovascular:     Rate and Rhythm: Normal rate.  Pulmonary:  Effort: Pulmonary effort is normal. No respiratory distress.     Breath sounds: Normal breath sounds.  Abdominal:     General: Abdomen is flat. Bowel sounds are normal. There is no distension.     Palpations: Abdomen is soft.     Tenderness: There is no abdominal tenderness.  Skin:    General: Skin is warm and dry.  Neurological:     General: No focal deficit present.     Mental Status: She is alert and oriented to person, place, and time.  Psychiatric:        Mood and Affect: Mood normal.        Behavior: Behavior normal.     Palliative Assessment/Data: 60%   Advanced Care Planning:   Existing Vynca/ACP Documentation: None  Primary Decision Maker: PATIENT  Pertinent diagnosis: Syncope, generalized progressive weakness, CKD 3B, recent history of subarachnoid hemorrhage and intraparenchymal hemorrhage  The patient and/or family consented to a voluntary Advance Care Planning Conversation in person. Individuals present for the conversation: The patient; patient's husband [name]; Camellia Kays, NP; Sara Kemps,  PA  Summary of the conversation: We discussed history leading up to admission, recent fall, acute current situation, CODE STATUS, options moving forward from full and aggressive care through comfort focused approach and multiple points in between.  Outcome of the conversations and/or documents completed: DNR/DNI (DNR-limited), continue to treat the treatable, attempted to return home at baseline/close to baseline  I spent 20 minutes providing separately identifiable ACP services with the patient and/or surrogate decision maker in a voluntary, in-person conversation discussing the patient's wishes and goals as detailed in the above note.  Assessment & Plan:   HPI/Patient Profile: 78 y.o. female  with past medical history of hypothyroidism, type 2 diabetes mellitus, COPD, HFpEF, A-fib/flutter on warfarin, rheumatic heart disease, AV and MV replacement, CVA, carotid artery stenosis, CKD-3B, lumbar laminectomy and compression and mild cognitive impairment who presents to the ED emergency department due to 4-day onset of worsening generalized weakness, subjective fever, nausea, diarrhea.  She was admitted on 07/21/2024 with syncope, generalized weakness with possible syncopal episodes, hypomagnesemia, prolonged QT interval, mild CKD 3B, history of left intraparenchymal hemorrhage/SAH, HFpEF, paroxysmal A-fib/flutter, and others.   Palliative medicine was consulted for GOC conversations and CODE STATUS discussion.  SUMMARY OF RECOMMENDATIONS   Changed to DNR-Limited (DNR/DNI) Continue to treat the treatable Ongoing workup for etiology behind weakness and syncopal episodes Palliative medicine will follow-up in a couple days if the patient remains admitted Please notify us  of any significant clinical change or new palliative needs in the interim  Symptom Management:  Per primary team Palliative medicine is available to assist as needed  Code Status: DNR - Limited (DNR/DNI)  Prognosis:  Unable to  determine  Discharge Planning:  To Be Determined   Discussed with: Patient, family, medical team, nursing team    Thank you for allowing us  to participate in the care of [name] PMT will continue to support holistically.  Billing based on MDM: High  Problems Addressed: One acute or chronic illness or injury that poses a threat to life or bodily function  Amount and/or Complexity of Data: Category 1:Review of prior external note(s) from each unique source, Review of the result(s) of each unique test, and Assessment requiring an independent historian(s) and Category 3:Discussion of management or test interpretation with external physician/other qualified health care professional/appropriate source (not separately reported)  Risks: Decision not to resuscitate or to de-escalate care because of poor prognosis  Detailed review of medical records (labs, imaging, vital signs), medically appropriate exam, discussed with treatment team, counseling and education to patient, family, & staff, documenting clinical information, medication management, coordination of care  Signed by: Camellia Kays, NP Palliative Medicine Team  Team Phone # [phone] (Nights/Weekends)  07/22/2024, 1:53 PM

## 2024-07-22 NOTE — Evaluation (Signed)
 Physical Therapy Evaluation Patient Details Name: [name] MRN: [id number] DOB: Nov 09, 1945 Today's Date: 07/22/2024  History of Present Illness  [name] is a 78 y.o. female with medical history significant of hypothyroidism, type 2 diabetes mellitus, COPD, HFpEF, A-fib/flutter on warfarin, rheumatic heart disease, AV and MV replacement, CVA, carotid artery stenosis, CKD-3B, lumbar laminectomy and compression and mild cognitive impairment who presents to the ED emergency department due to 4-day onset of worsening generalized weakness, subjective fever, nausea, diarrhea.  Patient usually ambulates within the house and without the need of any assistive device, but she has been requiring a rollator whereby she sits on it instead of walking with it due to weakness.  Husband reported a syncopal episode while in the ED     She was admitted to [facility] from 9/26 to 10/8 due to syncope and collapse in which she sustained a head trauma and was found to have intraparenchymal hemorrhage (she is on warfarin).  Repeated MRI reviewed by neurosurgery at that time was stable and there was no need for surgical intervention.     She was seen in the ED on 10/11 due to UTI and was discharged home with antibiotics.  She was doing fairly until she started to complain of weakness as described above.   Clinical Impression  Patient has difficulty repositioning self during sit to supine in bed, very unsteady on feet with near loss of balance when taking steps without AD, safer using RW and tolerated ambulating in room, hallway without loss of balance, but limited mostly due to c/o fatigue. Patient tolerated sitting up in chair after therapy with her spouse present. Patient will benefit from continued skilled physical therapy in hospital and recommended venue below to increase strength, balance, endurance for safe ADLs and gait.          If plan is discharge home, recommend the following: A little help with  walking and/or transfers;A little help with bathing/dressing/bathroom;Help with stairs or ramp for entrance;Assist for transportation;Assistance with cooking/housework   Can travel by private vehicle        Equipment Recommendations None recommended by PT  Recommendations for Other Services       Functional Status Assessment Patient has had a recent decline in their functional status and demonstrates the ability to make significant improvements in function in a reasonable and predictable amount of time.     Precautions / Restrictions Precautions Precautions: Fall Recall of Precautions/Restrictions: Impaired Restrictions Weight Bearing Restrictions Per Provider Order: No      Mobility  Bed Mobility Overal bed mobility: Needs Assistance Bed Mobility: Sit to Supine, Supine to Sit     Supine to sit: Min assist Sit to supine: Min assist   General bed mobility comments: slow labored movement    Transfers Overall transfer level: Needs assistance Equipment used: Rolling walker (2 wheels), None, 1 person hand held assist Transfers: Sit to/from Stand, Bed to chair/wheelchair/BSC Sit to Stand: Min assist   Step pivot transfers: Min assist       General transfer comment: very unsteady on feet with near loss of balance when not using an AD, safer using RW    Ambulation/Gait Ambulation/Gait assistance: Contact guard assist, Min assist Gait Distance (Feet): 50 Feet Assistive device: Rolling walker (2 wheels) Gait Pattern/deviations: Decreased step length - right, Decreased step length - left, Decreased stride length Gait velocity: decreased     General Gait Details: slightly labored movement without loss of balance and limited mostly due to  fatigue  Stairs            Wheelchair Mobility     Tilt Bed    Modified Rankin (Stroke Patients Only)       Balance Overall balance assessment: Needs assistance Sitting-balance support: Feet supported, No upper  extremity supported Sitting balance-Leahy Scale: Fair Sitting balance - Comments: fair/good seated at EOB   Standing balance support: During functional activity, No upper extremity supported Standing balance-Leahy Scale: Poor Standing balance comment: fair using RW                             Pertinent Vitals/Pain Pain Assessment Pain Assessment: No/denies pain    Home Living Family/patient expects to be discharged to:: Private residence Living Arrangements: Spouse/significant other Available Help at Discharge: Family;Available 24 hours/day   Home Access: Stairs to enter Entrance Stairs-Rails: Right;Left Entrance Stairs-Number of Steps: 3 Alternate Level Stairs-Number of Steps: Flight Home Layout: Two level;Able to live on main level with bedroom/bathroom Home Equipment: Shower seat - built in;Grab bars - tub/shower;Rolling Walker (2 wheels);Rollator (4 wheels);Wheelchair - manual      Prior Function Prior Level of Function : Needs assist;History of Falls (last six months)       Physical Assist : Mobility (physical);ADLs (physical) Mobility (physical): Bed mobility;Transfers;Gait;Stairs   Mobility Comments: Was ambulating household distances using 3 point cane, but  has been using RW recently due to falls ADLs Comments: Independent for most household ADLs, assisted by family to community     Extremity/Trunk Assessment   Upper Extremity Assessment Upper Extremity Assessment: Defer to OT evaluation    Lower Extremity Assessment Lower Extremity Assessment: Generalized weakness    Cervical / Trunk Assessment Cervical / Trunk Assessment: Kyphotic  Communication   Communication Communication: No apparent difficulties    Cognition Arousal: Alert Behavior During Therapy: WFL for tasks assessed/performed   PT - Cognitive impairments: History of cognitive impairments, Memory                       PT - Cognition Comments: per spouse pt has had  memory problems since her CVA Following commands: Intact Following commands impaired: Follows one step commands with increased time     Cueing Cueing Techniques: Verbal cues     General Comments      Exercises     Assessment/Plan    PT Assessment Patient needs continued PT services  PT Problem List Decreased strength;Decreased activity tolerance;Decreased balance;Decreased mobility       PT Treatment Interventions DME instruction;Gait training;Stair training;Functional mobility training;Therapeutic activities;Therapeutic exercise;Balance training;Patient/family education    PT Goals (Current goals can be found in the Care Plan section)  Acute Rehab PT Goals Patient Stated Goal: return home with family to assist PT Goal Formulation: With patient/family Time For Goal Achievement: 07/26/24 Potential to Achieve Goals: Good    Frequency Min 3X/week     Co-evaluation PT/OT/SLP Co-Evaluation/Treatment: Yes Reason for Co-Treatment: To address functional/ADL transfers PT goals addressed during session: Mobility/safety with mobility;Balance;Proper use of DME         AM-PAC PT 6 Clicks Mobility  Outcome Measure Help needed turning from your back to your side while in a flat bed without using bedrails?: A Little Help needed moving from lying on your back to sitting on the side of a flat bed without using bedrails?: A Little Help needed moving to and from a bed to a chair (including a wheelchair)?: A  Little Help needed standing up from a chair using your arms (e.g., wheelchair or bedside chair)?: A Little Help needed to walk in hospital room?: A Little Help needed climbing 3-5 steps with a railing? : A Lot 6 Click Score: 17    End of Session Equipment Utilized During Treatment: Gait belt Activity Tolerance: Patient tolerated treatment well;Patient limited by fatigue Patient left: in chair;with bed alarm set;with family/visitor present Nurse Communication: Mobility  status PT Visit Diagnosis: Unsteadiness on feet (R26.81);Other abnormalities of gait and mobility (R26.89);Muscle weakness (generalized) (M62.81)    Time: 9087-9066 PT Time Calculation (min) (ACUTE ONLY): 21 min   Charges:   PT Evaluation $PT Eval Moderate Complexity: 1 Mod PT Treatments $Therapeutic Activity: 8-22 mins PT General Charges $$ ACUTE PT VISIT: 1 Visit         12:28 PM, 07/22/24 Lynwood Music, MPT Physical Therapist with [facility] [address] [phone] office [phone] mobile phone

## 2024-07-23 ENCOUNTER — Other Ambulatory Visit (HOSPITAL_COMMUNITY): Payer: Self-pay

## 2024-07-23 ENCOUNTER — Telehealth (HOSPITAL_COMMUNITY): Payer: Self-pay | Admitting: Pharmacy Technician

## 2024-07-23 ENCOUNTER — Institutional Professional Consult (permissible substitution) (INDEPENDENT_AMBULATORY_CARE_PROVIDER_SITE_OTHER): Admitting: Otolaryngology

## 2024-07-23 DIAGNOSIS — R001 Bradycardia, unspecified: Secondary | ICD-10-CM

## 2024-07-23 DIAGNOSIS — I5020 Unspecified systolic (congestive) heart failure: Secondary | ICD-10-CM | POA: Diagnosis not present

## 2024-07-23 DIAGNOSIS — Z952 Presence of prosthetic heart valve: Secondary | ICD-10-CM

## 2024-07-23 DIAGNOSIS — Z87820 Personal history of traumatic brain injury: Secondary | ICD-10-CM

## 2024-07-23 DIAGNOSIS — I4811 Longstanding persistent atrial fibrillation: Secondary | ICD-10-CM

## 2024-07-23 DIAGNOSIS — Z7901 Long term (current) use of anticoagulants: Secondary | ICD-10-CM

## 2024-07-23 DIAGNOSIS — R55 Syncope and collapse: Secondary | ICD-10-CM | POA: Diagnosis not present

## 2024-07-23 LAB — PROTIME-INR
INR: 3.3 — ABNORMAL HIGH (ref 0.8–1.2)
Prothrombin Time: 35.3 s — ABNORMAL HIGH (ref 11.4–15.2)

## 2024-07-23 LAB — BASIC METABOLIC PANEL WITH GFR
Anion gap: 11 (ref 5–15)
BUN: 17 mg/dL (ref 8–23)
CO2: 28 mmol/L (ref 22–32)
Calcium: 8.7 mg/dL — ABNORMAL LOW (ref 8.9–10.3)
Chloride: 99 mmol/L (ref 98–111)
Creatinine, Ser: 1.3 mg/dL — ABNORMAL HIGH (ref 0.44–1.00)
GFR, Estimated: 41 mL/min — ABNORMAL LOW (ref >60)
Glucose, Bld: 94 mg/dL (ref 70–99)
Potassium: 3.4 mmol/L — ABNORMAL LOW (ref 3.5–5.1)
Sodium: 139 mmol/L (ref 135–145)

## 2024-07-23 LAB — PRO BRAIN NATRIURETIC PEPTIDE: Pro Brain Natriuretic Peptide: 2110 pg/mL — ABNORMAL HIGH (ref <300.0)

## 2024-07-23 MED ORDER — ENSURE PLUS HIGH PROTEIN PO LIQD: 237.0000 mL | Freq: Two times a day (BID) | ORAL | 0 refills | Status: AC

## 2024-07-23 MED ORDER — WARFARIN - PHARMACIST DOSING INPATIENT: Freq: Every day | Status: DC

## 2024-07-23 MED ORDER — POTASSIUM CHLORIDE CRYS ER 20 MEQ PO TBCR
40.0000 meq | EXTENDED_RELEASE_TABLET | Freq: Every day | ORAL | Status: DC
  Administered 2024-07-23 – 2024-07-24 (×2): 40 meq via ORAL
  Filled 2024-07-23: qty 2
  Filled 2024-07-23: qty 4

## 2024-07-23 MED ORDER — TORSEMIDE 20 MG PO TABS
20.0000 mg | ORAL_TABLET | Freq: Every day | ORAL | Status: DC
  Administered 2024-07-24: 20 mg via ORAL
  Filled 2024-07-23: qty 1

## 2024-07-23 MED ORDER — ORAL CARE MOUTH RINSE: 15.0000 mL | OROMUCOSAL | Status: DC | PRN

## 2024-07-23 MED ORDER — TORSEMIDE 20 MG PO TABS
40.0000 mg | ORAL_TABLET | Freq: Every day | ORAL | Status: DC
  Administered 2024-07-23: 40 mg via ORAL
  Filled 2024-07-23: qty 2

## 2024-07-23 NOTE — Plan of Care (Signed)

## 2024-07-23 NOTE — Progress Notes (Signed)
 Physical Therapy Treatment Patient Details Name: [name] MRN: [id number] DOB: Oct 01, 1946 Today's Date: 07/23/2024   History of Present Illness [name] is a 78 y.o. female with medical history significant of hypothyroidism, type 2 diabetes mellitus, COPD, HFpEF, A-fib/flutter on warfarin, rheumatic heart disease, AV and MV replacement, CVA, carotid artery stenosis, CKD-3B, lumbar laminectomy and compression and mild cognitive impairment who presents to the ED emergency department due to 4-day onset of worsening generalized weakness, subjective fever, nausea, diarrhea.  Patient usually ambulates within the house and without the need of any assistive device, but she has been requiring a rollator whereby she sits on it instead of walking with it due to weakness.  Husband reported a syncopal episode while in the ED     She was admitted to [facility] from 9/26 to 10/8 due to syncope and collapse in which she sustained a head trauma and was found to have intraparenchymal hemorrhage (she is on warfarin).  Repeated MRI reviewed by neurosurgery at that time was stable and there was no need for surgical intervention.     She was seen in the ED on 10/11 due to UTI and was discharged home with antibiotics.  She was doing fairly until she started to complain of weakness as described above.    PT Comments  Patient agreeable for therapy. Patient demonstrates fair/good return for sitting up at bedside with [facility] flat, fair carryover for completing BLE exercises while seated bedside and limited mostly due to mild SOB and fatigue. Patient demonstrates increased endurance/distance for gait training without loss of balance tolerated sitting up in chair after therapy with spouse present. Patient will benefit from continued skilled physical therapy in hospital and recommended venue below to increase strength, balance, endurance for safe ADLs and gait.     If plan is discharge home, recommend the following: A  little help with walking and/or transfers;Help with stairs or ramp for entrance;A little help with bathing/dressing/bathroom;Assist for transportation;Assistance with cooking/housework   Can travel by private vehicle        Equipment Recommendations  None recommended by PT    Recommendations for Other Services       Precautions / Restrictions Precautions Precautions: Fall Recall of Precautions/Restrictions: Impaired Restrictions Weight Bearing Restrictions Per Provider Order: No     Mobility  Bed Mobility Overal bed mobility: Needs Assistance Bed Mobility: Supine to Sit     Supine to sit: Supervision     General bed mobility comments: slightly labored movement with HOB flat    Transfers Overall transfer level: Needs assistance Equipment used: Rolling walker (2 wheels), None, 1 person hand held assist Transfers: Sit to/from Stand, Bed to chair/wheelchair/BSC Sit to Stand: Contact guard assist, Min assist   Step pivot transfers: Min assist, Contact guard assist       General transfer comment: fair/good return for transferring to/from chair and commode in bathroom    Ambulation/Gait Ambulation/Gait assistance: Contact guard assist Gait Distance (Feet): 100 Feet Assistive device: Rolling walker (2 wheels) Gait Pattern/deviations: Decreased step length - right, Decreased step length - left, Decreased stride length Gait velocity: decreased     General Gait Details: increased endurance/distance for gait training with fair/good return for using RW and no loss of balance, limited mostly due to fatigue   Stairs             Wheelchair Mobility     Tilt Bed    Modified Rankin (Stroke Patients Only)  Balance Overall balance assessment: Needs assistance Sitting-balance support: Feet supported, No upper extremity supported Sitting balance-Leahy Scale: Good Sitting balance - Comments: seated at EOB   Standing balance support: During functional  activity, No upper extremity supported Standing balance-Leahy Scale: Poor Standing balance comment: fair/good using RW                            Communication Communication Communication: No apparent difficulties  Cognition Arousal: Alert Behavior During Therapy: WFL for tasks assessed/performed   PT - Cognitive impairments: History of cognitive impairments, Memory                       PT - Cognition Comments: per spouse pt has had memory problems since her CVA Following commands: Intact Following commands impaired: Follows one step commands with increased time    Cueing Cueing Techniques: Verbal cues  Exercises General Exercises - Lower Extremity Long Arc Quad: AROM, Both, 10 reps, Seated Hip Flexion/Marching: Seated, AROM, Strengthening, Both, 10 reps Toe Raises: Seated, AROM, Strengthening, Both, 10 reps Heel Raises: Seated, AROM, Strengthening, Both, 10 reps    General Comments General comments (skin integrity, edema, etc.): Pt reported feeling light headed during LE work with PT in the chair. BP was then taken at 147/118 with a pulse of 71BPM.      Pertinent Vitals/Pain Pain Assessment Pain Assessment: No/denies pain    Home Living                          Prior Function            PT Goals (current goals can now be found in the care plan section) Acute Rehab PT Goals Patient Stated Goal: return home with family to assist PT Goal Formulation: With patient/family Time For Goal Achievement: 07/26/24 Potential to Achieve Goals: Good Progress towards PT goals: Progressing toward goals    Frequency    Min 3X/week      PT Plan      Co-evaluation PT/OT/SLP Co-Evaluation/Treatment: Yes Reason for Co-Treatment: To address functional/ADL transfers PT goals addressed during session: Mobility/safety with mobility;Balance;Proper use of DME OT goals addressed during session: ADL's and self-care      AM-PAC PT 6 Clicks  Mobility   Outcome Measure  Help needed turning from your back to your side while in a flat bed without using bedrails?: None Help needed moving from lying on your back to sitting on the side of a flat bed without using bedrails?: A Little Help needed moving to and from a bed to a chair (including a wheelchair)?: A Little Help needed standing up from a chair using your arms (e.g., wheelchair or bedside chair)?: A Little Help needed to walk in hospital room?: A Little Help needed climbing 3-5 steps with a railing? : A Little 6 Click Score: 19    End of Session Equipment Utilized During Treatment: Gait belt Activity Tolerance: Patient tolerated treatment well;Patient limited by fatigue Patient left: in chair;with call bell/phone within reach;with chair alarm set Nurse Communication: Mobility status PT Visit Diagnosis: Unsteadiness on feet (R26.81);Other abnormalities of gait and mobility (R26.89);Muscle weakness (generalized) (M62.81)     Time: 8577-8548 PT Time Calculation (min) (ACUTE ONLY): 29 min  Charges:    $Gait Training: 8-22 mins $Therapeutic Exercise: 8-22 mins PT General Charges $$ ACUTE PT VISIT: 1 Visit  3:37 PM, 07/23/24 Lynwood Music, MPT Physical Therapist with [facility] [phone] office [phone] mobile phone

## 2024-07-23 NOTE — TOC Progression Note (Signed)
 DME note   Patient Details  Name: [name] MRN: [id number] Date of Birth: 05-05-1946  Transition of Care [facility]) CM/SW Contact  Sharlyne Stabs, RN Phone Number: 07/23/2024, 11:15 AM   The patient and family requested Bed side commode for home. She is confined to one level of the home environment and there is no toilet on that level.  Referral sent to [employer], discharging planning for tomorrow.     Expected Discharge Plan: Home w Home Health Services Barriers to Discharge: Continued Medical Work up

## 2024-07-23 NOTE — Progress Notes (Signed)
 Occupational Therapy Treatment Patient Details Name: [name] MRN: [id number] DOB: 01-16-1946 Today's Date: 07/23/2024   History of present illness [name] is a 78 y.o. female with medical history significant of hypothyroidism, type 2 diabetes mellitus, COPD, HFpEF, A-fib/flutter on warfarin, rheumatic heart disease, AV and MV replacement, CVA, carotid artery stenosis, CKD-3B, lumbar laminectomy and compression and mild cognitive impairment who presents to the ED emergency department due to 4-day onset of worsening generalized weakness, subjective fever, nausea, diarrhea.  Patient usually ambulates within the house and without the need of any assistive device, but she has been requiring a rollator whereby she sits on it instead of walking with it due to weakness.  Husband reported a syncopal episode while in the ED     She was admitted to [facility] from 9/26 to 10/8 due to syncope and collapse in which she sustained a head trauma and was found to have intraparenchymal hemorrhage (she is on warfarin).  Repeated MRI reviewed by neurosurgery at that time was stable and there was no need for surgical intervention.     She was seen in the ED on 10/11 due to UTI and was discharged home with antibiotics.  She was doing fairly until she started to complain of weakness as described above. (Per DO)   OT comments  Pt agreeable to OT and PT co-treatment. Pt demonstrated ability to ambulate with RW with CGA to the nurses station followed by grooming at the sink and then a toileting without a rest break. Pt did report feeling light headed initially when in the chair with a BP of 147/118 and pulse of 71. Pt completed grooming with CGA while standing at the sink and supervision to CGA for peri-care seated on the toilet. Pt left in the chair with chair alarm set and call bell within reach. Pt will benefit from continued OT in the hospital to increase strength, balance, and endurance for safe ADL's.          If plan is discharge home, recommend the following:  A little help with walking and/or transfers;A little help with bathing/dressing/bathroom;Assistance with cooking/housework;Assist for transportation;Help with stairs or ramp for entrance;Direct supervision/assist for medications management   Equipment Recommendations  BSC/3in1    Recommendations for Other Services Rehab consult;Speech consult    Precautions / Restrictions Precautions Precautions: Fall Recall of Precautions/Restrictions: Impaired Restrictions Weight Bearing Restrictions Per Provider Order: No       Mobility Bed Mobility Overal bed mobility: Needs Assistance Bed Mobility: Supine to Sit     Supine to sit: Supervision     General bed mobility comments: No physical assist needed.    Transfers Overall transfer level: Needs assistance   Transfers: Sit to/from Stand, Bed to chair/wheelchair/BSC Sit to Stand: Contact guard assist, Min assist     Step pivot transfers: Min assist, Contact guard assist     General transfer comment: EOB to chair without AD; leaning on chair arms     Balance Overall balance assessment: Needs assistance Sitting-balance support: No upper extremity supported, Feet supported Sitting balance-Leahy Scale: Good Sitting balance - Comments: seated at EOB   Standing balance support: During functional activity, No upper extremity supported Standing balance-Leahy Scale: Poor Standing balance comment: poor to fair without RW; fair with RW.                           ADL either performed or assessed with clinical judgement   ADL  Overall ADL's : Needs assistance/impaired     Grooming: Wash/dry hands;Brushing hair;Contact guard assist;Standing Grooming Details (indicate cue type and reason): Completed standing at the sink with RW available if needed.                 Toilet Transfer: Contact guard assist;Ambulation;Rolling walker (2 wheels) Toilet Transfer Details  (indicate cue type and reason): Ambulated to the toilet with RW after completing hair brushing at the sink. Toileting- Clothing Manipulation and Hygiene: Supervision/safety;Contact guard assist;Sitting/lateral lean Toileting - Clothing Manipulation Details (indicate cue type and reason): Pt completed peri-care on toilet with lateral leans without physical assist.     Functional mobility during ADLs: Contact guard assist;Rolling walker (2 wheels) General ADL Comments: Able to ambulate to nurses station and back with RW; intermittent reports of feeling SOB and fatigued, but no rest break taken in the w/c that was following the pt.     Communication Communication Communication: No apparent difficulties   Cognition Arousal: Alert Behavior During Therapy: WFL for tasks assessed/performed Cognition: History of cognitive impairments   Orientation impairments: Time         OT - Cognition Comments: Pt oriented to place and was off by one year and one month for time orientation.                 Following commands: Intact Following commands impaired: Follows one step commands with increased time      Cueing   Cueing Techniques: Verbal cues               General Comments Pt reported feeling light headed during LE work with PT in the chair. BP was then taken at 147/118 with a pulse of 71BPM.    Pertinent Vitals/ Pain       Pain Assessment Pain Assessment: No/denies pain                                                          Frequency  Min 2X/week        Progress Toward Goals  OT Goals(current goals can now be found in the care plan section)  Progress towards OT goals: Progressing toward goals  Acute Rehab OT Goals Patient Stated Goal: return home OT Goal Formulation: With patient/family Time For Goal Achievement: 08/05/24 Potential to Achieve Goals: Good ADL Goals Pt Will Perform Grooming: with modified independence Pt Will Perform  Lower Body Dressing: with modified independence Pt Will Transfer to Toilet: with modified independence Pt Will Perform Toileting - Clothing Manipulation and hygiene: with modified independence  Plan      Co-evaluation    PT/OT/SLP Co-Evaluation/Treatment: Yes Reason for Co-Treatment: To address functional/ADL transfers   OT goals addressed during session: ADL's and self-care                          End of Session Equipment Utilized During Treatment: Gait belt;Rolling walker (2 wheels)  OT Visit Diagnosis: Unsteadiness on feet (R26.81);Muscle weakness (generalized) (M62.81);Other symptoms and signs involving cognitive function;Low vision, both eyes (H54.2);History of falling (Z91.81)   Activity Tolerance Patient tolerated treatment well   Patient Left in chair;with call bell/phone within reach;with chair alarm set;with family/visitor present   Nurse Communication          Time: [phone]  OT Time Calculation (min): 26 min  Charges: OT General Charges $OT Visit: 1 Visit OT Treatments $Self Care/Home Management : 8-22 mins (One unit taken due to co-treat with PT.)  [name] OT, MOT   [name] 07/23/2024, 3:18 PM

## 2024-07-23 NOTE — Progress Notes (Signed)
 PROGRESS NOTE    Patient: [name]                            PCP: Sheryle Carwin, MD                    DOB: December 26, 1945            DOA: 07/21/2024 FMW:[id number]             DOS: 07/23/2024, 10:31 AM   LOS: 0 days   Date of Service: The patient was seen and examined on 07/23/2024  Subjective:   The patient was seen and examined this morning. Pleasantly confused, heart rate 56, blood pressure 140/71 Satting 92% on 2 L of oxygen  His overnight  Brief Narrative:    [name] is a 78 y.o. female with medical history significant of hypothyroidism, type 2 diabetes mellitus, COPD, HFpEF, A-fib/flutter on warfarin, rheumatic heart disease, AV and MV replacement, CVA, carotid artery stenosis, CKD-3B, lumbar laminectomy and compression and mild cognitive impairment who presents to the ED emergency department due to 4-day onset of worsening generalized weakness, subjective fever, nausea, diarrhea.  Patient usually ambulates within the house and without the need of any assistive device, but she has been requiring a rollator whereby she sits on it instead of walking with it due to weakness. Husband reported a syncopal episode while in the ED   She was admitted to [facility] from 9/26 to 10/8 due to syncope and collapse in which she sustained a head trauma and was found to have intraparenchymal hemorrhage (she is on warfarin).  Repeated MRI reviewed by neurosurgery at that time was stable and there was no need for surgical intervention.   She was seen in the ED on 10/11 due to UTI and was discharged home with antibiotics.  She was doing fairly until she started to complain of weakness as described above.   ED Course:  sShe was hemodynamically stable.  Workup in the ED showed normocytic anemia.  BMP was normal except for chloride of 97, glucose 113, creatinine 1.58.  D-dimer 2.15, urinalysis was normal.  Influenza A, B, SARS coronavirus 2, RSV was negative. CT angiography of chest showed no  pulmonary embolism. Chronic bilateral pleural effusions calcified plaques suggestive of prior asbestos exposure. Indeterminate chronic stable right hilar, left hilar, and prevascular mediastinal lymphadenopathy. Chest x-ray shows stable chest.  No acute process Patient was treated with calcium  carbonate.  TRH was asked to admit patient.    Assessment & Plan:   Active Problems:   Gastroesophageal reflux disease   Mixed hyperlipidemia   Essential hypertension   Acquired hypothyroidism   Syncope   Intraparenchymal hematoma of brain (HCC)   Generalized weakness   Elevated d-dimer   Chronic kidney disease, stage 3b (HCC)   Chronic heart failure with preserved ejection fraction (HFpEF) (HCC)   Mood disorder  Syncope Continue telemetry and watch for arrhythmias Troponins >>> 24, 27,-remaining flat, denies any chest pain Echocardiogram done on 06/29/2024 showed EF of 77 5%.  No WMA.  LV diastolic parameters indeterminate. CT angiogram -negative PE, CAD, chronic BL pleural effusion-calcified plaques suggestive of prior asbestos exposure.  Prevascular lymphadenopathy   Echocardiogram >>>  Carotid artery Dopplers >>> Results from 07/02/2024 Carotid US  at [facility] Summary:   Right Carotid: Velocities in the right ICA are consistent with a 1-39%  stenosis.  Left Carotid: Velocities in the left  ICA are consistent with a 1-39% stenosis.  Vertebrals: Bilateral vertebral arteries demonstrate antegrade flow.  Subclavians: Normal flow hemodynamics were seen in bilateral subclavian  arteries.    Generalized weakness w ?  Syncopal episodes Protein supplement will be provided Continue fall precaution Consulted PT OT, recommended home health-has been arranged   Supratherapeutic INR INR 3.7, warfarin will be held at this time -Checking INR daily, resuming Coumadin  with INR goal 2-3 -With recent history of fall, with subdural hematoma-holding Coumadin    Hypomagnesemia Magnesium  1.6, this  will be replenished   Prolonged QT interval QTc 524 ms Avoid QT prolonging drugs Magnesium  was 1.6, this was replenished Repeat EKG needed   Elevated D-dimer D-dimer 2.15 CT angiograph of chest ruled out pulmonary embolism   CKD 3B -Stable Creatinine 1.58 (creatinine is within baseline range) >>> 1.30 Renally adjust medications, avoid nephrotoxic agents/dehydration/hypotension   History of left intraparenchymal hemorrhage/SAH Stable. INR elevated at 3.7,, check an INR, goal 2-3 Pharmacy consulted resuming Coumadin    Chronic HFpEF Continue total input/output, daily weights and fluid restriction Continue heart healthy diet      Continue home torsemide , Farxiga  -Full dose torsemide  40 mg, with 40 EQ of KCl today  Bisoprolol  temporarily held due to prolonged PR interval TTE done on 06/29/2024 showed EF of 70-75%.  No RWMA.  Indeterminate LV diastolic parameters.   Paroxysmal A-fib/flutter Continue amiodarone  100 mg daily heart rate, 56 Bisoprolol  will be temporarily held due to prolonged PR interval (347 ms) Warfarin will be held at this time due to supratherapeutic INR, and subdural hematoma   Essential hypertension (controlled) Continue home torsemide  Continue Klor-Con    Acquired hypothyroidism- Continue home Synthroid    GERD - Continue PPI   Mixed hyperlipidemia - Continue Lipitor   Mood disorder - Continue Lexapro .  Ethics full code: Due to multiple comorbidities, steady decline, noted for poor prognosis Consulting palliative care, determine goals of care and CODE STATUS    ----------------------------------------------------------------------------------------------------------------------------------------------- Nutritional status:  The patient's BMI is: Body mass index is 26.24 kg/m. I agree with the assessment and plan as outlined  ------------------------------------------------------------------------------------------------------------------------------------------------  DVT prophylaxis:  SCDs Start: 07/21/24 2252   Code Status:   Code Status: Limited: Do not attempt resuscitation (DNR) -DNR-LIMITED -Do Not Intubate/DNI   Family Communication: Husband present at bedside-updated -Advance care planning has been discussed.   Admission status:   Status is: Observation The patient remains OBS appropriate and will d/c before 2 midnights.   Disposition: From  - home             Planning for discharge in 1 days -Home with home health -has been set up  Procedures:   No admission procedures for hospital encounter.   Antimicrobials:  Anti-infectives (From admission, onward)    None        Medication:   amiodarone   200 mg Oral Daily   atorvastatin   40 mg Oral Daily   Chlorhexidine  Gluconate Cloth  6 each Topical Daily   dapagliflozin  propanediol  10 mg Oral q AM   escitalopram   10 mg Oral Q breakfast   feeding supplement  237 mL Oral BID BM   levothyroxine   100 mcg Oral Q0600   mupirocin  ointment   Nasal BID   pantoprazole   40 mg Oral QAC breakfast   potassium chloride  SA  40 mEq Oral Daily   torsemide   40 mg Oral Daily    acetaminophen  **OR** acetaminophen , guaiFENesin -dextromethorphan , prochlorperazine    Objective:   Vitals:   07/23/24 0000 07/23/24 0125 07/23/24  0341 07/23/24 0730  BP: (!) 126/33 137/70 (!) 140/71   Pulse: (!) 57 (!) 56 (!) 56   Resp: 15 19 19    Temp:  (!) 97.5 F (36.4 C) 98.4 F (36.9 C)   TempSrc:  Oral Oral   SpO2: (!) 88% 98% 98% 92%  Weight:        Intake/Output Summary (Last 24 hours) at 07/23/2024 1031 Last data filed at 07/23/2024 0343 Gross per 24 hour  Intake --  Output 400 ml  Net -400 ml   Filed Weights   07/21/24 1627 07/21/24 2150  Weight: 69.9 kg 67.2 kg     Physical examination:        General:  AAO x 1,  cooperative, no distress;    HEENT:  Normocephalic, PERRL, otherwise with in Normal limits   Neuro:  CNII-XII intact. , normal motor and sensation, reflexes intact   Lungs:   Clear to auscultation BL, Respirations unlabored,  No wheezes / crackles  Cardio:    S1/S2, RRR, No murmure, No Rubs or Gallops   Abdomen:  Soft, non-tender, bowel sounds active all four quadrants, no guarding or peritoneal signs.  Muscular  skeletal:  Limited exam -global generalized weaknesses - in bed, able to move all 4 extremities,   2+ pulses,  symmetric, No pitting edema  Skin:  Dry, warm to touch, negative for any Rashes,  Wounds: Please see nursing documentation          ------------------------------------------------------------------------------------------------------------------------------------------    LABs:     Latest Ref Rng & Units 07/22/2024   12:45 AM 07/21/2024    5:02 PM 07/13/2024    4:16 PM  CBC  WBC 4.0 - 10.5 K/uL 5.7  5.3  6.6   Hemoglobin 12.0 - 15.0 g/dL 88.5  88.4  88.0   Hematocrit 36.0 - 46.0 % 37.6  37.9  39.2   Platelets 150 - 400 K/uL 296  304  260       Latest Ref Rng & Units 07/23/2024    4:41 AM 07/22/2024   12:45 AM 07/21/2024    5:02 PM  CMP  Glucose 70 - 99 mg/dL 94  865  886   BUN 8 - 23 mg/dL 17  18  20    Creatinine 0.44 - 1.00 mg/dL 8.69  8.49  8.41   Sodium 135 - 145 mmol/L 139  141  137   Potassium 3.5 - 5.1 mmol/L 3.4  3.0  3.7   Chloride 98 - 111 mmol/L 99  99  97   CO2 22 - 32 mmol/L 28  28  28    Calcium  8.9 - 10.3 mg/dL 8.7  8.6  8.9   Total Protein 6.5 - 8.1 g/dL  6.8  7.1   Total Bilirubin 0.0 - 1.2 mg/dL  0.5  0.4   Alkaline Phos 38 - 126 U/L  68  69   AST 15 - 41 U/L  22  28   ALT 0 - 44 U/L  14  18        Micro Results:  .  Reviewed, urine culture from 07/13/2024 positive for KlebsiellaResistant to ampicillin , nitrofurantoin   Radiology Reports ECHOCARDIOGRAM LIMITED Result Date: 07/22/2024    ECHOCARDIOGRAM LIMITED REPORT   Patient Name:   [name] Date of Exam: 07/22/2024 Medical Rec #:  [id number]       Height:       63.0 in Accession #:    [id number]      Weight:  148.1 lb Date of Birth:  March 18, 1946       BSA:          1.702 m Patient Age:    78 years        BP:           121/33 mmHg Patient Gender: F               HR:           59 bpm. Exam Location:  [facility] Procedure: 2D Echo, Cardiac Doppler and Color Doppler (Both Spectral and Color            Flow Doppler were utilized during procedure). Indications:    Syncope R55  History:        Patient has prior history of Echocardiogram examinations, most                 recent 06/29/2024. CHF, Stroke and COPD, Arrythmias:Atrial                 Fibrillation and Atrial Flutter, Signs/Symptoms:Syncope; Risk                 Factors:Hypertension, Diabetes and Dyslipidemia. Hx of Breast                 carcinoma (HCC) and aortic & mitral valve replacement with                 bioprosthetic valves (From Hx).                 Aortic Valve: 21 mm Magna Ease valve is present in the aortic                 position. Procedure Date: 07/18/2013.                 Mitral Valve: 25 mm Magna Ease bioprosthetic valve valve is                 present in the mitral position. Procedure Date: 07/18/2013.  Sonographer:    Aida Pizza RCS Referring Phys: [id number] OLADAPO ADEFESO IMPRESSIONS  1. Left ventricular ejection fraction, by estimation, is 60 to 65%. The left ventricle has normal function. The left ventricle has no regional wall motion abnormalities. Left ventricular diastolic function could not be evaluated.  2. Right ventricular systolic function is normal. The right ventricular size is normal. There is normal pulmonary artery systolic pressure.  3. The mitral valve has been repaired/replaced. No evidence of mitral valve regurgitation. The mean mitral valve gradient is 5.0 mmHg. There is a 25 mm Magna Ease bioprosthetic valve present in the mitral position. Procedure Date: 07/18/2013.  4. The aortic valve has been  repaired/replaced. Aortic valve regurgitation is not visualized. No aortic stenosis is present. There is a 21 mm Magna Ease valve present in the aortic position. Procedure Date: 07/18/2013. Aortic valve mean gradient measures 17.3 mmHg. Compared to March 2025 Echo, mean gradient across aortic prosthesis has significantly increased from 8 mm Hg to 17.3 mm Hg now.  5. The inferior vena cava is normal in size with greater than 50% respiratory variability, suggesting right atrial pressure of 3 mmHg. FINDINGS  Left Ventricle: Left ventricular ejection fraction, by estimation, is 60 to 65%. The left ventricle has normal function. The left ventricle has no regional wall motion abnormalities. The left ventricular internal cavity size was normal in size. There is  no left ventricular hypertrophy. Left ventricular diastolic function could not be  evaluated. Right Ventricle: The right ventricular size is normal. No increase in right ventricular wall thickness. Right ventricular systolic function is normal. There is normal pulmonary artery systolic pressure. The tricuspid regurgitant velocity is 2.44 m/s, and  with an assumed right atrial pressure of 3 mmHg, the estimated right ventricular systolic pressure is 26.8 mmHg. Left Atrium: Left atrial size was normal in size. Right Atrium: Right atrial size was normal in size. Pericardium: There is no evidence of pericardial effusion. Mitral Valve: The mitral valve has been repaired/replaced. There is a 25 mm Magna Ease bioprosthetic valve present in the mitral position. Procedure Date: 07/18/2013. MV peak gradient, 16.2 mmHg. The mean mitral valve gradient is 5.0 mmHg. Tricuspid Valve: The tricuspid valve is normal in structure. Tricuspid valve regurgitation is mild . No evidence of tricuspid stenosis. Aortic Valve: The aortic valve has been repaired/replaced. Aortic valve regurgitation is not visualized. No aortic stenosis is present. Aortic valve mean gradient measures 17.3 mmHg.  Aortic valve peak gradient measures 28.4 mmHg. Aortic valve area, by VTI measures 0.59 cm. There is a 21 mm Magna Ease valve present in the aortic position. Procedure Date: 07/18/2013. Pulmonic Valve: The pulmonic valve was normal in structure. Pulmonic valve regurgitation is not visualized. No evidence of pulmonic stenosis. Aorta: The aortic root is normal in size and structure. Venous: The inferior vena cava is normal in size with greater than 50% respiratory variability, suggesting right atrial pressure of 3 mmHg. IAS/Shunts: No atrial level shunt detected by color flow Doppler. LEFT VENTRICLE PLAX 2D LVIDd:         3.50 cm LVIDs:         2.30 cm LV PW:         1.10 cm LV IVS:        0.80 cm LVOT diam:     1.60 cm LV SV:         40 LV SV Index:   24 LVOT Area:     2.01 cm  RIGHT VENTRICLE TAPSE (M-mode): 1.2 cm LEFT ATRIUM         Index LA diam:    3.50 cm 2.06 cm/m  AORTIC VALVE AV Area (Vmax):    0.61 cm AV Area (Vmean):   0.61 cm AV Area (VTI):     0.59 cm AV Vmax:           266.50 cm/s AV Vmean:          192.000 cm/s AV VTI:            0.685 m AV Peak Grad:      28.4 mmHg AV Mean Grad:      17.3 mmHg LVOT Vmax:         80.23 cm/s LVOT Vmean:        58.300 cm/s LVOT VTI:          0.201 m LVOT/AV VTI ratio: 0.29  AORTA Ao Root diam: 2.70 cm MITRAL VALVE                TRICUSPID VALVE MV Area (PHT): 1.83 cm     TR Peak grad:   23.8 mmHg MV Area VTI:   0.58 cm     TR Vmax:        244.00 cm/s MV Peak grad:  16.2 mmHg MV Mean grad:  5.0 mmHg     SHUNTS MV Vmax:       2.01 m/s     Systemic VTI:  0.20 m MV Vmean:  102.0 cm/s   Systemic Diam: 1.60 cm MV Decel Time: 415 msec MV E velocity: 191.00 cm/s MV A velocity: 79.50 cm/s MV E/A ratio:  2.40 Vishnu Priya Mallipeddi Electronically signed by Diannah Late Mallipeddi Signature Date/Time: 07/22/2024/3:50:40 PM    Final    US  Carotid Bilateral Result Date: 07/22/2024 CLINICAL DATA:  Syncope, cerebral hemorrhage, hypertension, hyperlipidemia and diabetes.  EXAM: BILATERAL CAROTID DUPLEX ULTRASOUND TECHNIQUE: Elnor scale imaging, color Doppler and duplex ultrasound were performed of bilateral carotid and vertebral arteries in the neck. COMPARISON:  11/11/2022 FINDINGS: Criteria: Quantification of carotid stenosis is based on velocity parameters that correlate the residual internal carotid diameter with NASCET-based stenosis levels, using the diameter of the distal internal carotid lumen as the denominator for stenosis measurement. The following velocity measurements were obtained: RIGHT ICA:  75/19 cm/sec CCA:  55/5 cm/sec SYSTOLIC ICA/CCA RATIO:  1.4 ECA:  56 cm/sec LEFT ICA:  97/20 cm/sec CCA:  57/8 cm/sec SYSTOLIC ICA/CCA RATIO:  1.7 ECA:  59 cm/sec RIGHT CAROTID ARTERY: Similar to slightly more prominent calcified plaque at the level of the carotid bulb and proximal right ICA. Estimated right ICA stenosis is less than 50%. RIGHT VERTEBRAL ARTERY: Antegrade flow with normal waveform and velocity. LEFT CAROTID ARTERY: Similar mild amount of calcified plaque at the level of the left carotid bulb and left ICA origin. Estimated left ICA stenosis is less than 50%. LEFT VERTEBRAL ARTERY: Antegrade flow with normal waveform and velocity. IMPRESSION: 1. Similar to slightly more prominent calcified plaque at the level of the right carotid bulb and proximal right ICA. Estimated right ICA stenosis is less than 50%. 2. Similar mild amount of calcified plaque at the level of the left carotid bulb and left ICA origin. Estimated left ICA stenosis is less than 50%. Electronically Signed   By: Marcey Moan M.D.   On: 07/22/2024 14:59    SIGNED: Adriana DELENA Grams, MD, FHM. FAAFP. [name] - [facility] Time spent - 55 min.  In seeing, evaluating and examining the patient. Reviewing medical records, labs, drawn plan of care. [facility],  Pager (please use [email or web address] to page/ text) Please use Epic Secure Chat for non-urgent communication (7AM-7PM)  If 7PM-7AM,  please contact night-coverage [email or web address], 07/23/2024, 10:31 AM

## 2024-07-23 NOTE — Telephone Encounter (Signed)
 Patient Product/process development scientist completed.    The patient is insured through [employer]. Patient has Medicare and is not eligible for a copay card, but may be able to apply for patient assistance or Medicare RX Payment Plan (Patient Must reach out to their plan, if eligible for payment plan), if available.    Ran test claim for Eliquis 5 mg and the current 30 day co-pay is $0.00.   This test claim was processed through [facility]- copay amounts may vary at other pharmacies due to pharmacy/plan contracts, or as the patient moves through the different stages of their insurance plan.     [name], CPHT Pharmacy Technician Patient Advocate Specialist Lead [facility] Patient Advocate Team Direct Number: [phone]  Fax: [phone]

## 2024-07-23 NOTE — Progress Notes (Deleted)
 Dear Dr. Sheryle, Here is my assessment for our mutual patient, [name]. Thank you for allowing me the opportunity to care for your patient. Please do not hesitate to contact me should you have any other questions. Sincerely, Dr. Eldora Blanch  Otolaryngology Clinic Note Referring provider: Dr. Sheryle HPI:  Initial office visit (07/2024): Initially seen on 06/28/2024 for facialtrauma afer fall with repair of chin laceration. Now returns for follow up. Patient reports: *** Patient additionally denies:  - other lacerations, malocclusion, teeth instability, trismus - enophthalmos, hypoglobus, vision loss or change,  - significant facial deformity - trouble chewing or swallowing - epistaxis hearing loss after trauma, nasal obstruction - otorrhea, vertigo.   Personal or FHx of bleeding dz or anesthesia difficulty: no   AP/AC: Warfarin  Tobacco: no  Independent Review of Additional Tests or Records:  CT Face independently interpreted (06/28/2024) with respect to facial bones: small left nasal bone fracture which is minimally dispalced; left frontal scalp contusion; noted left blowout fracture, medial bone fragment abuts inferior rectus; noted modest displacement medial orbital wall fracture.    PMH/Meds/All/SocHx/FamHx/ROS:   Past Medical History:  Diagnosis Date   Anemia    Asthma 2010   Atrial fibrillation (HCC)    Atrial flutter (HCC)    Breast carcinoma (HCC) 1995   1995   Cardioembolic stroke (HCC) 01/2012   Right frontal in 01/2012; normal carotid ultrasound; possible LAA thrombus by TEE; virtual complete neurologic recovery   Carotid artery stenosis 1961   Chronic kidney disease, stage 2, mildly decreased GFR    GFR of approximately 60   Diabetes mellitus without complication (HCC) 5+ years ago   Controlled most of time   Diverticulosis of colon (without mention of hemorrhage) 2012   Dr. Golda   Fasting hyperglycemia    120 fasting   Gastroesophageal reflux disease     Gastroparesis    Heart failure with improved ejection fraction (HFimpEF) (HCC) 5+ years   Heart murmur 1961   Hemorrhoids    History of aortic valve replacement with bioprosthetic valve    History of mitral valve replacement with bioprosthetic valve    Hyperlipidemia    Hypertension    pt denies 05/30/13     Dr Alvan chester   Hyponatremia    Hypothyroidism    Rheumatic heart disease    Shortness of breath    Syncope and collapse February, 2024     Past Surgical History:  Procedure Laterality Date   AORTIC VALVE REPLACEMENT N/A 07/18/2013   Procedure: AORTIC VALVE REPLACEMENT (AVR);  Surgeon: Dorise MARLA Fellers, MD;  Location: [facility] OR;  Service: Open Heart Surgery;  Laterality: N/A;   BACK SURGERY     BREAST LUMPECTOMY Right 10/03/1993   CARDIAC CATHETERIZATION  5+ years   CARDIOVERSION N/A 07/26/2013   Procedure: CARDIOVERSION;  Surgeon: Vina LULLA Gull, MD;  Location: [facility] ENDOSCOPY;  Service: Cardiovascular;  Laterality: N/A;   CARDIOVERSION N/A 01/04/2023   Procedure: CARDIOVERSION;  Surgeon: Hobart Powell BRAVO, MD;  Location: [facility] ENDOSCOPY;  Service: Cardiovascular;  Laterality: N/A;   CARDIOVERSION N/A 02/23/2024   Procedure: CARDIOVERSION;  Surgeon: [name], MD;  Location: [facility] ORS;  Service: Endoscopy;  Laterality: N/A;   COLONOSCOPY  10/03/2010   Negative screening procedure   DILATION AND CURETTAGE OF UTERUS     ESOPHAGEAL MANOMETRY N/A 06/17/2013   Procedure: ESOPHAGEAL MANOMETRY (EM);  Surgeon: Alm JONELLE Gander, MD;  Location: [facility] ENDOSCOPY;  Service: Endoscopy;  Laterality: N/A;   INTRAOPERATIVE  TRANSESOPHAGEAL ECHOCARDIOGRAM N/A 07/18/2013   Procedure: INTRAOPERATIVE TRANSESOPHAGEAL ECHOCARDIOGRAM;  Surgeon: Dorise MARLA Fellers, MD;  Location: [facility] OR;  Service: Open Heart Surgery;  Laterality: N/A;   LEFT AND RIGHT HEART CATHETERIZATION WITH CORONARY ANGIOGRAM N/A 07/10/2013   Procedure: LEFT AND RIGHT HEART CATHETERIZATION WITH CORONARY ANGIOGRAM;  Surgeon:  Lonni JONETTA Cash, MD;  Location: [facility] CATH LAB;  Service: Cardiovascular;  Laterality: N/A;   LOOP RECORDER INSERTION N/A 07/02/2024   Procedure: LOOP RECORDER INSERTION;  Surgeon: Nancey Eulas BRAVO, MD;  Location: [facility] INVASIVE CV LAB;  Service: Cardiovascular;  Laterality: N/A;   LUMBAR LAMINECTOMY/DECOMPRESSION MICRODISCECTOMY Right 05/13/2014   Procedure: LUMBAR LAMINECTOMY/DECOMPRESSION MICRODISCECTOMY 1 LEVEL  lumbar four/five;  Surgeon: Darina MALVA Boehringer, MD;  Location: [facility] NEURO ORS;  Service: Neurosurgery;  Laterality: Right;   MITRAL VALVE REPLACEMENT N/A 07/18/2013   Procedure: MITRAL VALVE (MV) REPLACEMENT;  Surgeon: Dorise MARLA Fellers, MD;  Location: [facility] OR;  Service: Open Heart Surgery;  Laterality: N/A;   MITRAL VALVE SURGERY  10/03/1960   [facility], closed mitral valvulotomy by finger fracture   TEE WITHOUT CARDIOVERSION  01/24/2012   Procedure: TRANSESOPHAGEAL ECHOCARDIOGRAM (TEE);  Surgeon: Redell GORMAN Shallow, MD;  Location: [facility] ENDOSCOPY;  Service: Cardiovascular;  Laterality: N/A;   TEE WITHOUT CARDIOVERSION N/A 07/11/2013   Procedure: TRANSESOPHAGEAL ECHOCARDIOGRAM (TEE);  Surgeon: Aleene JINNY Passe, MD;  Location: [facility] ENDOSCOPY;  Service: Cardiovascular;  Laterality: N/A;   TEE WITHOUT CARDIOVERSION N/A 07/26/2013   Procedure: TRANSESOPHAGEAL ECHOCARDIOGRAM (TEE);  Surgeon: Vina LULLA Gull, MD;  Location: [facility] ENDOSCOPY;  Service: Cardiovascular;  Laterality: N/A;   TUBAL LIGATION  10/04/1971    Family History  Problem Relation Age of Onset   Heart disease Brother 50       MI   Rheum arthritis Maternal Grandmother    Asthma Maternal Grandfather    Hypothyroidism Mother    Diabetes Sister    Cirrhosis Father      Social Connections: Unknown (07/21/2024)   Social Connection and Isolation Panel    Frequency of Communication with Friends and Family: Patient declined    Frequency of Social Gatherings with Friends and Family: Patient declined    Attends Religious Services: Not on Programmer, systems of Clubs or Organizations: Not on file    Attends Banker Meetings: Not on file    Marital Status: Not on file     No current facility-administered medications for this visit. No current outpatient medications on file.  Facility-Administered Medications Ordered in Other Visits:    acetaminophen  (TYLENOL ) tablet 650 mg, 650 mg, Oral, Q6H PRN, 650 mg at 07/22/24 2128 **OR** acetaminophen  (TYLENOL ) suppository 650 mg, 650 mg, Rectal, Q6H PRN, Adefeso, Oladapo, DO   amiodarone  (PACERONE ) tablet 200 mg, 200 mg, Oral, Daily, Adefeso, Oladapo, DO, 200 mg at 07/22/24 0907   atorvastatin  (LIPITOR) tablet 40 mg, 40 mg, Oral, Daily, Adefeso, Oladapo, DO, 40 mg at 07/22/24 0907   Chlorhexidine  Gluconate Cloth 2 % PADS 6 each, 6 each, Topical, Daily, Shahmehdi, Seyed A, MD, 6 each at 07/22/24 1326   dapagliflozin  propanediol (FARXIGA ) tablet 10 mg, 10 mg, Oral, q AM, Adefeso, Oladapo, DO, 10 mg at 07/23/24 0556   escitalopram  (LEXAPRO ) tablet 10 mg, 10 mg, Oral, Q breakfast, Adefeso, Oladapo, DO, 10 mg at 07/23/24 0743   feeding supplement (ENSURE PLUS HIGH PROTEIN) liquid 237 mL, 237 mL, Oral, BID BM, Adefeso, Oladapo, DO, 237 mL at 07/22/24 0907   guaiFENesin -dextromethorphan  (ROBITUSSIN DM) 100-10 MG/5ML syrup 5  mL, 5 mL, Oral, Q4H PRN, Shahmehdi, Seyed A, MD   levothyroxine  (SYNTHROID ) tablet 100 mcg, 100 mcg, Oral, Q0600, Adefeso, Oladapo, DO, 100 mcg at 07/23/24 0556   mupirocin  ointment (BACTROBAN ) 2 %, , Nasal, BID, Shahmehdi, Seyed A, MD, 1 Application at 07/22/24 2129   pantoprazole  (PROTONIX ) EC tablet 40 mg, 40 mg, Oral, QAC breakfast, Adefeso, Oladapo, DO, 40 mg at 07/23/24 0743   potassium chloride  SA (KLOR-CON  M) CR tablet 20 mEq, 20 mEq, Oral, Daily, Shahmehdi, Seyed A, MD, 20 mEq at 07/22/24 0907   prochlorperazine  (COMPAZINE ) injection 10 mg, 10 mg, Intravenous, Q6H PRN, Adefeso, Oladapo, DO   torsemide  (DEMADEX ) tablet 20 mg, 20 mg, Oral, QODAY, Adefeso,  Oladapo, DO, 20 mg at 07/22/24 0907   torsemide  (DEMADEX ) tablet 40 mg, 40 mg, Oral, QODAY, Adefeso, Oladapo, DO   Physical Exam:   There were no vitals taken for this visit.  Salient findings:  CN II-XII intact *** Bilateral EAC clear and TM intact with well pneumatized middle ear spaces Weber 512: *** Rinne 512: AC > BC b/l *** Rine 1024: AC > BC b/l *** Anterior rhinoscopy: Septum ***; bilateral inferior turbinates with *** No lesions of oral cavity/oropharynx; dentition *** No obviously palpable neck masses/lymphadenopathy/thyromegaly No respiratory distress or stridor***  Seprately Identifiable Procedures:  Prior to initiating any procedures, risks/benefits/alternatives were explained to the patient and verbal consent obtained. None***  Impression & Plans:  Chanise Habeck is a 78 y.o. female with ***  No diagnosis found.   - f/u ***  See below regarding exact medications prescribed this encounter including dosages and route: No orders of the defined types were placed in this encounter.     Thank you for allowing me the opportunity to care for your patient. Please do not hesitate to contact me should you have any other questions.  Sincerely, Eldora Blanch, MD Otolaryngologist (ENT), [facility] Phone: [phone] Fax: [phone]  07/23/2024, 8:01 AM   MDM:  Level *** Complexity/Problems addressed: *** Data complexity: *** independent review of *** - Morbidity: ***  - Prescription Drug prescribed or managed: ***

## 2024-07-23 NOTE — Progress Notes (Signed)
 PHARMACY - ANTICOAGULATION CONSULT NOTE  Pharmacy Consult for warfarin Indication: Aortic/Mitral valve, afib  No Known Allergies  Patient Measurements: Weight: 67.2 kg (148 lb 2.4 oz)  Vital Signs: Temp: 98.4 F (36.9 C) (10/21 0341) Temp Source: Oral (10/21 0341) BP: 140/71 (10/21 0341) Pulse Rate: 56 (10/21 0341)  Labs: Recent Labs    07/21/24 1702 07/21/24 1710 07/22/24 0045 07/23/24 0441  HGB 11.5*  --  11.4*  --   HCT 37.9  --  37.6  --   PLT 304  --  296  --   LABPROT  --  38.6*  --   --   INR  --  3.7*  --   --   CREATININE 1.58*  --  1.50* 1.30*    Estimated Creatinine Clearance: 31.8 mL/min (A) (by C-G formula based on SCr of 1.3 mg/dL (H)).   Medical History: Past Medical History:  Diagnosis Date   Anemia    Asthma 2010   Atrial fibrillation (HCC)    Atrial flutter (HCC)    Breast carcinoma (HCC) 1995   1995   Cardioembolic stroke (HCC) 01/2012   Right frontal in 01/2012; normal carotid ultrasound; possible LAA thrombus by TEE; virtual complete neurologic recovery   Carotid artery stenosis 1961   Chronic kidney disease, stage 2, mildly decreased GFR    GFR of approximately 60   Diabetes mellitus without complication (HCC) 5+ years ago   Controlled most of time   Diverticulosis of colon (without mention of hemorrhage) 2012   Dr. Golda   Fasting hyperglycemia    120 fasting   Gastroesophageal reflux disease    Gastroparesis    Heart failure with improved ejection fraction (HFimpEF) (HCC) 5+ years   Heart murmur 1961   Hemorrhoids    History of aortic valve replacement with bioprosthetic valve    History of mitral valve replacement with bioprosthetic valve    Hyperlipidemia    Hypertension    pt denies 05/30/13     Dr Alvan chester   Hyponatremia    Hypothyroidism    Rheumatic heart disease    Shortness of breath    Syncope and collapse February, 2024    Medications:  Warfarin 1.25 on Sun/Thur and 2.5mg  all other  days  Assessment: 78 year old female with history of aortic and mitral valve replacement and afib on chronic warfarin. Patient with recent fall and noted to have IPH/SAH that did not at the time require surgical intervention. She was later discharged back home on warfarin. INR on admit this admission was 3.7, warfarin has been on hold.   INR this morning was within range at 3.3. Primary team was concerned with high INR and recent IPH/SAH so letting INR trend down below 3 before resuming warfarin.   Goal of Therapy:  INR goal 2.5-3.5 Monitor platelets by anticoagulation protocol: Yes   Plan:  Hold warfarin today to allow INR to drift down If discharged would consider warfarin 1.25mg  on Tu/Tr/Sa/Su and 2.5mg  all other days.  INR daily while admitted   Dempsey Blush PharmD., BCPS Clinical Pharmacist 07/23/2024 8:45 AM

## 2024-07-23 NOTE — Progress Notes (Signed)
 Mobility Specialist Progress Note:    07/23/24 1300  Mobility  Activity Ambulated with assistance;Pivoted/transferred to/from [facility]  Level of Assistance Minimal assist, patient does 75% or more  Assistive Device Front wheel walker  Distance Ambulated (ft) 5 ft  Range of Motion/Exercises Active;All extremities  Activity Response Tolerated well  Mobility Referral Yes  Mobility visit 1 Mobility  Mobility Specialist Start Time (ACUTE ONLY) 1300  Mobility Specialist Stop Time (ACUTE ONLY) 1318  Mobility Specialist Time Calculation (min) (ACUTE ONLY) 18 min   Pt received in bed, requesting assistance to [facility]. Required MinA for bed mobility/standing, and SBA to ambulate with RW. Tolerated well, asx throughout. Left on BSC, call bell in reach. NT notified, all needs met.  Marcia Hartwell Mobility Specialist Please contact via Special educational needs teacher or  Rehab office at [phone]

## 2024-07-23 NOTE — Plan of Care (Signed)

## 2024-07-23 NOTE — Consult Note (Signed)
 Cardiology Consultation   Patient ID: [name] MRN: [id number]; DOB: 09-Sep-1946  Admit date: 07/21/2024 Date of Consult: 07/23/2024  PCP:  Sheryle Carwin, MD    [facility] Providers Cardiologist:  Alvan Carrier, MD  Electrophysiologist:  Eulas FORBES Furbish, MD     Patient Profile: [name] is a 78 y.o. female with a hx of MS/AI/AS s/p pericardial MVR, pericardial AVR  in 2014, chronic HFpEF, cardioembolic stroke 2013 in setting of LAA thrombus, persistent atrial fib/flutter s/p DCCV in 01/2023 & 02/2024, HTN, HLD, mild carotid artery disease, obstructive lung disease, remote breast CA, CKD 3b-4, diverticulosis, DM, GERD, syncope felt vasovagal in setting of shoulder pain s/p ILR in 07/02/2024, hypothyroidism  who is being seen 07/23/2024 for the evaluation of syncope at the request of Dr. Willette.  History of Present Illness: [name] was admitted 9/26-10/05/2024 for syncope.  She was found to have IPH  contusion, and facial fractures likely due to trauma but no evidence of stroke or other etiologies to explain syncope on MRI brain.  Echo showed preserved EF with no significant valvular abnormalities.  Cardiology/EP was consulted, ILR was recommended and place in 07/02/2024 with concern for recurrence of A-fib as cause of her syncope. Warfarin was held but resumed by neurosurgery. Discharged on Amio 200 mg daily, Lipitor 40 mg daily, Bisoprolol  5 mg daily, Farxiga  10 mg daily, KCL 20 MEQ, Torsemide  alternating 20 and 40 me every other day, and warfarin per coumadin .   She was seen in the ED on 10/11 due to UTI and was discharged home with antibiotics. She was doing fairly until she started to complain of weakness as described above. No other med changes.   Presented to [facility] ED 07/21/2024 for 4 days of worsening generalized weakness, subjective fever, nausea, diarrhea.  K 3.7 > 3.4, MG 1.6, Cr 1.58 > 1.3, Phosp WNL, pro BNP 2110, Tn 24 >27, CBC WNL, D-dimer 2.15, negative  viral resp panel, Positive MRSA EKG: NSR, HR 64, prolonged PR interval 347 MS, prolonged QT interval Qtc 524 ms , wandering baseline (no significant change) CXR with no acute findings CTA chest negative for PE, although noted chronic bilateral pleural effusions calcified plaques suggestive of prior asbestosis exposure, lymphadenopathy, severe atherosclerotic plaque of the thoracic aorta and four-vessel coronary artery calcification. Echo showed EF 60 to 65%, MV/AV Magna Ease prosthetic valve in place, mean gradient across the aortic prosthesis significantly increased from 8 to 17.3 mmHg. Carotid ultrasound showed similar mild bilateral carotid stenosis. Per chart review, Husband reported syncopal episode while in the ED and cardiology was consulted.  On interview, is accompanied by husband who assists with history. Husband is concerned about ongoing weakness that has progressed to where patient can not walk on her own any more. Husband reported while in the ED patient suddenly went non responsive for < 10 secs with arms raised above head associated with shaking, eyes rolled back and facial glare. Patient does not recall syncope or any event surrounding. Reports SOB with minimal exertion since 9/26 fall/syncope as well as LE edema. Notes some improvement with home torsemide . Not sure of improvement with IV lasix .  Denies any chest pain, palpitations, orthopnea.  Reports daily adherence to medications.  Past Medical History:  Diagnosis Date   Anemia    Asthma 2010   Atrial fibrillation (HCC)    Atrial flutter (HCC)    Breast carcinoma (HCC) 1995   1995   Cardioembolic stroke (HCC) 01/2012   Right frontal in 01/2012;  normal carotid ultrasound; possible LAA thrombus by TEE; virtual complete neurologic recovery   Carotid artery stenosis 1961   Chronic kidney disease, stage 2, mildly decreased GFR    GFR of approximately 60   Diabetes mellitus without complication (HCC) 5+ years ago   Controlled  most of time   Diverticulosis of colon (without mention of hemorrhage) 2012   Dr. Golda   Fasting hyperglycemia    120 fasting   Gastroesophageal reflux disease    Gastroparesis    Heart failure with improved ejection fraction (HFimpEF) (HCC) 5+ years   Heart murmur 1961   Hemorrhoids    History of aortic valve replacement with bioprosthetic valve    History of mitral valve replacement with bioprosthetic valve    Hyperlipidemia    Hypertension    pt denies 05/30/13     Dr Alvan chester   Hyponatremia    Hypothyroidism    Rheumatic heart disease    Shortness of breath    Syncope and collapse February, 2024    Past Surgical History:  Procedure Laterality Date   AORTIC VALVE REPLACEMENT N/A 07/18/2013   Procedure: AORTIC VALVE REPLACEMENT (AVR);  Surgeon: Dorise MARLA Fellers, MD;  Location: [facility] OR;  Service: Open Heart Surgery;  Laterality: N/A;   BACK SURGERY     BREAST LUMPECTOMY Right 10/03/1993   CARDIAC CATHETERIZATION  5+ years   CARDIOVERSION N/A 07/26/2013   Procedure: CARDIOVERSION;  Surgeon: Vina LULLA Gull, MD;  Location: [facility] ENDOSCOPY;  Service: Cardiovascular;  Laterality: N/A;   CARDIOVERSION N/A 01/04/2023   Procedure: CARDIOVERSION;  Surgeon: Hobart Powell BRAVO, MD;  Location: [facility] ENDOSCOPY;  Service: Cardiovascular;  Laterality: N/A;   CARDIOVERSION N/A 02/23/2024   Procedure: CARDIOVERSION;  Surgeon: [name], MD;  Location: [facility] ORS;  Service: Endoscopy;  Laterality: N/A;   COLONOSCOPY  10/03/2010   Negative screening procedure   DILATION AND CURETTAGE OF UTERUS     ESOPHAGEAL MANOMETRY N/A 06/17/2013   Procedure: ESOPHAGEAL MANOMETRY (EM);  Surgeon: Alm JONELLE Gander, MD;  Location: [facility] ENDOSCOPY;  Service: Endoscopy;  Laterality: N/A;   INTRAOPERATIVE TRANSESOPHAGEAL ECHOCARDIOGRAM N/A 07/18/2013   Procedure: INTRAOPERATIVE TRANSESOPHAGEAL ECHOCARDIOGRAM;  Surgeon: Dorise MARLA Fellers, MD;  Location: [facility] OR;  Service: Open Heart Surgery;  Laterality: N/A;    LEFT AND RIGHT HEART CATHETERIZATION WITH CORONARY ANGIOGRAM N/A 07/10/2013   Procedure: LEFT AND RIGHT HEART CATHETERIZATION WITH CORONARY ANGIOGRAM;  Surgeon: Lonni JONETTA Cash, MD;  Location: [facility] CATH LAB;  Service: Cardiovascular;  Laterality: N/A;   LOOP RECORDER INSERTION N/A 07/02/2024   Procedure: LOOP RECORDER INSERTION;  Surgeon: Nancey Eulas BRAVO, MD;  Location: [facility] INVASIVE CV LAB;  Service: Cardiovascular;  Laterality: N/A;   LUMBAR LAMINECTOMY/DECOMPRESSION MICRODISCECTOMY Right 05/13/2014   Procedure: LUMBAR LAMINECTOMY/DECOMPRESSION MICRODISCECTOMY 1 LEVEL  lumbar four/five;  Surgeon: Darina MALVA Boehringer, MD;  Location: [facility] NEURO ORS;  Service: Neurosurgery;  Laterality: Right;   MITRAL VALVE REPLACEMENT N/A 07/18/2013   Procedure: MITRAL VALVE (MV) REPLACEMENT;  Surgeon: Dorise MARLA Fellers, MD;  Location: [facility] OR;  Service: Open Heart Surgery;  Laterality: N/A;   MITRAL VALVE SURGERY  10/03/1960   [facility], closed mitral valvulotomy by finger fracture   TEE WITHOUT CARDIOVERSION  01/24/2012   Procedure: TRANSESOPHAGEAL ECHOCARDIOGRAM (TEE);  Surgeon: Redell GORMAN Shallow, MD;  Location: [facility] ENDOSCOPY;  Service: Cardiovascular;  Laterality: N/A;   TEE WITHOUT CARDIOVERSION N/A 07/11/2013   Procedure: TRANSESOPHAGEAL ECHOCARDIOGRAM (TEE);  Surgeon: Aleene JINNY Passe, MD;  Location: [facility] ENDOSCOPY;  Service: Cardiovascular;  Laterality: N/A;   TEE WITHOUT CARDIOVERSION N/A 07/26/2013   Procedure: TRANSESOPHAGEAL ECHOCARDIOGRAM (TEE);  Surgeon: Vina LULLA Gull, MD;  Location: [facility] ENDOSCOPY;  Service: Cardiovascular;  Laterality: N/A;   TUBAL LIGATION  10/04/1971     Home Medications:  Prior to Admission medications   Medication Sig Start Date End Date Taking? Authorizing Provider  acetaminophen  (TYLENOL ) 325 MG tablet Take 2 tablets (650 mg total) by mouth every 6 (six) hours as needed. 01/20/24  Yes Elnor Savant A, DO  albuterol  (VENTOLIN  HFA) 108 (90 Base) MCG/ACT inhaler Inhale 2 puffs into the lungs  every 6 (six) hours as needed for wheezing or shortness of breath. 08/17/23  Yes [provider]  amiodarone  (PACERONE ) 200 MG tablet Take 1 tablet (200 mg total) by mouth daily. Patient taking differently: Take 200 mg by mouth in the morning. 02/02/24  Yes Mealor, [name], MD  atorvastatin  (LIPITOR) 40 MG tablet Take 1 tablet (40 mg total) by mouth daily. 07/05/24  Yes BranchDorn FALCON, MD  azelastine  (ASTELIN ) 0.1 % nasal spray Place 1 spray into both nostrils 2 (two) times daily. Use in each nostril as directed Patient taking differently: Place 1 spray into both nostrils daily as needed for allergies. Use in each nostril as directed 04/30/24  Yes [name], NP  bisoprolol  (ZEBETA ) 5 MG tablet Take 1 tablet (5 mg total) by mouth daily. Patient taking differently: Take 5 mg by mouth in the morning. 02/23/24  Yes Furth, Cadence H, PA-C  calcium  carbonate (TUMS - DOSED IN MG ELEMENTAL CALCIUM ) 500 MG chewable tablet Chew 2-3 tablets by mouth as needed for indigestion or heartburn.   Yes [provider]  escitalopram  (LEXAPRO ) 10 MG tablet Take 10 mg by mouth daily with breakfast. 04/10/15  Yes [provider]  famotidine  (PEPCID ) 20 MG tablet TAKE ONE TABLET BY MOUTH EVERY DAY AFTER SUPPER Patient taking differently: Take 20 mg by mouth daily. TAKE ONE TABLET BY MOUTH EVERY DAY AFTER SUPPER 12/25/23  Yes Darlean Ozell NOVAK, MD  FARXIGA  10 MG TABS tablet Take 10 mg by mouth in the morning. 03/28/23  Yes [provider]  fexofenadine (ALLEGRA) 180 MG tablet Take 180 mg by mouth in the morning.   Yes [provider]  fluticasone  (FLONASE ) 50 MCG/ACT nasal spray Place 1 spray into both nostrils daily as needed for allergies. 09/05/13  Yes BranchDorn FALCON, MD  levothyroxine  (SYNTHROID ) 100 MCG tablet Take 100 mcg by mouth daily before breakfast.   Yes [provider]  metFORMIN  (GLUCOPHAGE ) 500 MG tablet Take 1 tablet (500 mg total) by mouth 2 (two)  times daily with a meal. 08/03/13  Yes Collins, Gina L, PA-C  Multiple Vitamin (MULTIVITAMIN WITH MINERALS) TABS tablet Take 1 tablet by mouth in the morning.   Yes [provider]  ondansetron  (ZOFRAN -ODT) 4 MG disintegrating tablet Take 4 mg by mouth every 6 (six) hours as needed for nausea or vomiting. 07/18/24  Yes [provider]  oxyCODONE  (OXY IR/ROXICODONE ) 5 MG immediate release tablet Take 1 tablet (5 mg total) by mouth every 6 (six) hours as needed for moderate pain (pain score 4-6). 07/10/24  Yes Gherghe, Costin M, MD  pantoprazole  (PROTONIX ) 40 MG tablet TAKE 1 TABLET 30 TO 60 MINUTES BEFORE FIRST MEAL OF THE DAY. Patient taking differently: Take 40 mg by mouth daily before breakfast. 03/15/24  Yes Darlean Ozell NOVAK, MD  Polyethyl Glycol-Propyl Glycol (SYSTANE OP) Place 1 drop into both eyes daily as  needed (dry eyes).   Yes [provider]  potassium chloride  SA (KLOR-CON  M) 20 MEQ tablet Take 1 tablet (20 mEq total) by mouth daily. Patient taking differently: Take 20 mEq by mouth in the morning. 11/14/22 07/22/24 Yes Rai, Ripudeep K, MD  torsemide  (DEMADEX ) 20 MG tablet Take 20mg  (1 tab) by mouth every other day alternating with 40mg  (2 tabs) every other day Patient taking differently: Take 20-40 mg by mouth See admin instructions. Take 20mg  (1 tab) by mouth every other day alternating with 40mg  (2 tabs) every other day. 04/12/24  Yes [name], MD  TRELEGY ELLIPTA  100-62.5-25 MCG/ACT AEPB Inhale 1 puff into the lungs daily. 06/25/24  Yes [provider]  warfarin (COUMADIN ) 2.5 MG tablet TAKE AS DIRECTED BY COUMADIN  CLINIC. Patient taking differently: TAKE AS DIRECTED BY COUMADIN  CLINIC. 2.5mg  Mon, Tuesday, Wed, Friday, Saturday 1.25mg  [place] and Thurs 05/13/24  Yes Branch, Dorn FALCON, MD  fluconazole (DIFLUCAN) 150 MG tablet Take 150 mg by mouth daily. Patient not taking: Reported on 07/22/2024 07/18/24   [provider]    Scheduled  Meds:  amiodarone   200 mg Oral Daily   atorvastatin   40 mg Oral Daily   Chlorhexidine  Gluconate Cloth  6 each Topical Daily   dapagliflozin  propanediol  10 mg Oral q AM   escitalopram   10 mg Oral Q breakfast   feeding supplement  237 mL Oral BID BM   levothyroxine   100 mcg Oral Q0600   mupirocin  ointment   Nasal BID   pantoprazole   40 mg Oral QAC breakfast   potassium chloride  SA  40 mEq Oral Daily   torsemide   40 mg Oral Daily   Continuous Infusions:  PRN Meds: acetaminophen  **OR** acetaminophen , guaiFENesin -dextromethorphan , prochlorperazine   Allergies:   No Known Allergies  Social History:   Social History   Socioeconomic History   Marital status: Married    Spouse name: Not on file   Number of children: 1   Years of education: Not on file   Highest education level: Not on file  Occupational History   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  Tobacco Use   Smoking status: Never   Smokeless tobacco: Never  Vaping Use   Vaping status: Never Used  Substance and Sexual Activity   Alcohol use: No    Alcohol/week: 0.0 standard drinks of alcohol   Drug use: No   Sexual activity: Not Currently  Other Topics Concern   Not on file  Social History Narrative   Married with children   No regular exercise    Family History:   Family History  Problem Relation Age of Onset   Heart disease Brother 80       MI   Rheum arthritis Maternal Grandmother    Asthma Maternal Grandfather    Hypothyroidism Mother    Diabetes Sister    Cirrhosis Father      ROS:  Please see the history of present illness.  All other ROS reviewed and negative.     Physical Exam/Data: Vitals:   07/23/24 0000 07/23/24 0125 07/23/24 0341 07/23/24 0730  BP: (!) 126/33 137/70 (!) 140/71   Pulse: (!) 57 (!) 56 (!) 56   Resp: 15 19 19    Temp:  (!) 97.5 F (36.4 C) 98.4 F (36.9 C)   TempSrc:  Oral Oral   SpO2: (!) 88% 98% 98% 92%  Weight:        Intake/Output Summary (Last 24  hours) at 07/23/2024 [phone]  Last data filed at 07/23/2024 0343 Gross per 24 hour  Intake --  Output 650 ml  Net -650 ml      07/21/2024    9:50 PM 07/21/2024    4:27 PM 07/13/2024    4:09 PM  Last 3 Weights  Weight (lbs) 148 lb 2.4 oz 154 lb 154 lb  Weight (kg) 67.2 kg 69.854 kg 69.854 kg     Body mass index is 26.24 kg/m.  General:  Well nourished, well developed, in no acute distress HEENT: normal Neck: no JVD Vascular: No carotid bruits; Distal pulses 2+ bilaterally Cardiac:  normal S1, S2; RRR; 2/6 systolic murmur best heard in RUSB and LUSB  Lungs:  clear to auscultation bilaterally, no wheezing, rhonchi or rales  Abd: soft, nontender, no hepatomegaly  Ext: no edema Musculoskeletal:  No deformities, BUE and BLE strength normal and equal Skin: warm and dry  Neuro:  CNs 2-12 intact, no focal abnormalities noted Psych:  Normal affect   EKG:  The EKG was personally reviewed and demonstrates:  NSR, HR 64, prolonged PR interval 347 MS,wandering baseline (no significant change) Telemetry:  Telemetry was personally reviewed and demonstrates:  1st degree AV block, HR 60-70's.   Relevant CV Studies: Carotid US  07/23/2023 IMPRESSION: 1. Similar to slightly more prominent calcified plaque at the level of the right carotid bulb and proximal right ICA. Estimated right ICA stenosis is less than 50%. 2. Similar mild amount of calcified plaque at the level of the left carotid bulb and left ICA origin. Estimated left ICA stenosis is less than 50%.  ECHO 07/22/2024 IMPRESSIONS   1. Left ventricular ejection fraction, by estimation, is 60 to 65%. The  left ventricle has normal function. The left ventricle has no regional  wall motion abnormalities. Left ventricular diastolic function could not  be evaluated.   2. Right ventricular systolic function is normal. The right ventricular  size is normal. There is normal pulmonary artery systolic pressure.   3. The mitral valve has been  repaired/replaced. No evidence of mitral  valve regurgitation. The mean mitral valve gradient is 5.0 mmHg. There is  a 25 mm Magna Ease bioprosthetic valve present in the mitral position.  Procedure Date: 07/18/2013.   4. The aortic valve has been repaired/replaced. Aortic valve  regurgitation is not visualized. No aortic stenosis is present. There is a  21 mm Magna Ease valve present in the aortic position. Procedure Date:  07/18/2013. Aortic valve mean gradient  measures 17.3 mmHg. Compared to March 2025 Echo, mean gradient across  aortic prosthesis has significantly increased from 8 mm Hg to 17.3 mm Hg  now.   5. The inferior vena cava is normal in size with greater than 50%  respiratory variability, suggesting right atrial pressure of 3 mmHg.   Laboratory Data: High Sensitivity Troponin:   Recent Labs  Lab 06/28/24 1219 06/28/24 1420  TROPONINIHS 16 14     Chemistry Recent Labs  Lab 07/21/24 1702 07/22/24 0045 07/23/24 0441  NA 137 141 139  K 3.7 3.0* 3.4*  CL 97* 99 99  CO2 28 28 28   GLUCOSE 113* 134* 94  BUN 20 18 17   CREATININE 1.58* 1.50* 1.30*  CALCIUM  8.9 8.6* 8.7*  MG 1.6*  --   --   GFRNONAA 33* 35* 41*  ANIONGAP 12 15 11     Recent Labs  Lab 07/21/24 1702 07/22/24 0045  PROT 7.1 6.8  ALBUMIN  4.0 3.8  AST 28 22  ALT 18 14  ALKPHOS 69 68  BILITOT 0.4 0.5   Lipids No results for input(s): CHOL, TRIG, HDL, LABVLDL, LDLCALC, CHOLHDL in the last 168 hours.  Hematology Recent Labs  Lab 07/21/24 1702 07/22/24 0045  WBC 5.3 5.7  RBC 4.30 4.33  HGB 11.5* 11.4*  HCT 37.9 37.6  MCV 88.1 86.8  MCH 26.7 26.3  MCHC 30.3 30.3  RDW 17.4* 17.2*  PLT 304 296   Thyroid  No results for input(s): TSH, FREET4 in the last 168 hours.  BNP Recent Labs  Lab 07/23/24 0441  PROBNP 2,110.0*    DDimer  Recent Labs  Lab 07/21/24 1710  DDIMER 2.15*    Radiology/Studies:  CT Angio Chest PE W and/or Wo Contrast Result Date: 07/21/2024 EXAM:  CTA CHEST 07/21/2024 07:16:17 PM TECHNIQUE: CTA of the chest was performed after the administration of intravenous contrast. Multiplanar reformatted images are provided for review. MIP images are provided for review. Automated exposure control, iterative reconstruction, and/or weight based adjustment of the mA/kV was utilized to reduce the radiation dose to as low as reasonably achievable. COMPARISON: CT angiography chest 08:30 11/10/2020 CLINICAL HISTORY: Pulmonary embolism (PE) suspected, low to intermediate prob, positive D-dimer. Table formatting from the original note was not included.; BIB family from home for subjective fever, nausea, non-productive cough, diarrhea and body aches. Rates pain 4/10. Denies other sx. No meds PTA. Steady gait. Alert, NAD, calm, interactive, resps e/u, speaking in clear complete sentences. FINDINGS: PULMONARY ARTERIES: Pulmonary arteries are adequately opacified for evaluation. No acute pulmonary embolus. Main pulmonary artery is normal in caliber. MEDIASTINUM: Aortic and mitral annular valve replacements. Prominent heart size. 4-vessel coronary artery calcification. Severe atherosclerotic plaque of the thoracic aorta. The pericardium demonstrates no acute abnormality. LYMPH NODES: Chronic stable Right hilar lymphadenopathy measuring up to 1.1 cm. Chronic stable Left hilar lymphadenopathy measuring up to 1.2 cm. Chronic stable Prevascular mediastinal lymphadenopathy measuring up to 1 cm. No axillary lymphadenopathy. LUNGS AND PLEURA: Biapical pleural/pulmonary scarring. Pleural calcifications noted bilaterally. Chronic stable Subpleural left lower lobe pulmonary micronodule (series 6, image 57) -no further follow-up indicated . Subpleural triangular micronodule along the right minor fissure likely an intrapulmonary lymph node no further follow-up indicated . Reticulations along the right anterior upper lobe likely sequelae of prior radiation therapy. No evidence of pleural  effusion or pneumothorax. UPPER ABDOMEN: Colonic diverticulosis. SOFT TISSUES AND BONES: Right breast surgical changes suggestive of lumpectomy. No acute bone or soft tissue abnormality. IMPRESSION: 1. No pulmonary embolism. 2. Chronic bilateral pleural effusions calcified plaques suggestive of prior asbestos exposure. 3. Indeterminate chronic stable right hilar, left hilar, and prevascular mediastinal lymphadenopathy. 4. Severe atherosclerotic plaque of the thoracic aorta and 4-vessel coronary artery calcification. Electronically signed by: Morgane Naveau MD 07/21/2024 07:32 PM EDT RP Workstation: [id number]   DG Chest 2 View Result Date: 07/21/2024 CLINICAL DATA:  Short of breath, fever EXAM: CHEST - 2 VIEW COMPARISON:  06/30/2024 FINDINGS: Frontal and lateral views of the chest demonstrates stable postsurgical changes from median sternotomy and mitral and aortic valve replacement. The cardiac silhouette is enlarged but stable. Chronic central vascular congestion without airspace disease, effusion, or pneumothorax. No acute bony abnormalities. IMPRESSION: 1. Stable chest, no acute process. Electronically Signed   By: Ozell Daring M.D.   On: 07/21/2024 17:50     Assessment and Plan:  Syncope  Prior syncope years ago attributed to vagal syncope in the setting of shoulder pain; more recent fall sounds more orthostatic  Most recent hospitalization cardio noted Possible cardiogenic causes would  include recurrence of AF with post-conversion pause, TdP due to prolonged QT. ILR inserted on 07/02/2024.  Per husband, patient experience unresponsiveness while in ED with arms raised above head associated with shaking, eyes rolled back and facial glare. Patient was not aware of episode but denies associated symptoms with event.  Tn 24 >27 D-dimer 2.15, CTA negative for PE.  Carotid US  showed similar  mild bilateral carotid stenosis.  ECHO showed EF 60 to 65%, MV/AV Magna Ease prosthetic valve in place, mean  gradient across the aortic prosthesis significantly increased from 8 to 17.3 mmHg. Based on presentation and review of telemetry, less concern for cardiac cause. Will contact [employer] for device interrogation.  Would recommend neurology consult or outpatient follow up.   1st degree AV block EKG:  NSR, HR 64, prolonged PR interval 347 MS,wandering baseline (no significant change) Telemetry: 1st degree AV block, HR 60-70's.  Bisoprolol  held due to prolonged PR of 347 ms. Continue to hold.   Hypo K/Mg K 3.7 > 3.4, MG 1.6 Treated with K & Mg supplement. Continue to monitor.   Prolonged QT interval  Qtc 524 ms  Avoid QT prolonging drugs Keep potassium > 4, magnesium  > 2 --aggressively   Acute on chronic HFpEF  ECHO this admission: 60-65%  Presents with progressively worsening SOB and LE edema since fall in 9/26.  pro BNP 2110 I/O -1050, wt 148, pending repeat wt, Cr stable as below.  Appears Euvolemic on exam.  Continue torsemide  alternating 20 and 40 mg, and Farxiga  10 mg Would consider increasing Klor Con with significant drop in K & Mg after giving home dose torsemide .   Paroxysmal afib/Flutter No signs of recurrent afib. Denies palpations.  Continue amio 200 mg  Holding Warfarin with INR 3.7 and goal INR is 2.5-3.5.  Bisoprolol  held due to prolonged PR of 347 ms. Continue to hold.   HTN  BP 140/71 this am Continue Torsemide  as above  HLD  Continue Lipitor 40 mg daily  CKD  Cr this am 1.3. (baseline 1.3-1.5)  Continue to monitor   Risk Assessment/Risk Scores:  New [place]  Heart Association (NYHA) Functional Class NYHA Class II  CHA2DS2-VASc Score = 9   This indicates a 12.2% annual risk of stroke. The patient's score is based upon: CHF History: 1 HTN History: 1 Diabetes History: 1 Stroke History: 2 Vascular Disease History: 1 Age Score: 2 Gender Score: 1        For questions or updates, please contact [facility] Please consult [email or web address] for  contact info under      Signed, Lorette CINDERELLA Kapur, PA-C  07/23/2024 8:52 AM

## 2024-07-24 DIAGNOSIS — R001 Bradycardia, unspecified: Secondary | ICD-10-CM | POA: Diagnosis not present

## 2024-07-24 DIAGNOSIS — R55 Syncope and collapse: Secondary | ICD-10-CM | POA: Diagnosis not present

## 2024-07-24 LAB — PRO BRAIN NATRIURETIC PEPTIDE: Pro Brain Natriuretic Peptide: 2155 pg/mL — ABNORMAL HIGH (ref <300.0)

## 2024-07-24 LAB — BASIC METABOLIC PANEL WITH GFR
Anion gap: 11 (ref 5–15)
BUN: 21 mg/dL (ref 8–23)
CO2: 29 mmol/L (ref 22–32)
Calcium: 8.4 mg/dL — ABNORMAL LOW (ref 8.9–10.3)
Chloride: 101 mmol/L (ref 98–111)
Creatinine, Ser: 1.56 mg/dL — ABNORMAL HIGH (ref 0.44–1.00)
GFR, Estimated: 33 mL/min — ABNORMAL LOW (ref >60)
Glucose, Bld: 104 mg/dL — ABNORMAL HIGH (ref 70–99)
Potassium: 3.3 mmol/L — ABNORMAL LOW (ref 3.5–5.1)
Sodium: 141 mmol/L (ref 135–145)

## 2024-07-24 LAB — PROTIME-INR
INR: 2.4 — ABNORMAL HIGH (ref 0.8–1.2)
Prothrombin Time: 27.2 s — ABNORMAL HIGH (ref 11.4–15.2)

## 2024-07-24 MED ORDER — TORSEMIDE 20 MG PO TABS: 20.0000 mg | ORAL_TABLET | Freq: Every day | ORAL | 0 refills | Status: DC

## 2024-07-24 MED ORDER — APIXABAN 5 MG PO TABS: 5.0000 mg | ORAL_TABLET | Freq: Two times a day (BID) | ORAL | 1 refills | Status: AC

## 2024-07-24 MED ORDER — APIXABAN 5 MG PO TABS: 5.0000 mg | ORAL_TABLET | Freq: Two times a day (BID) | ORAL | Status: DC

## 2024-07-24 NOTE — Discharge Instructions (Signed)
 Information on my medicine - ELIQUIS (apixaban)  Why was Eliquis prescribed for you? Eliquis was prescribed for you to reduce the risk of a blood clot forming that can cause a stroke if you have a medical condition called atrial fibrillation (a type of irregular heartbeat) and prior heart valve surgery.  What do You need to know about Eliquis ? Take your Eliquis TWICE DAILY - one tablet in the morning and one tablet in the evening with or without food. If you have difficulty swallowing the tablet whole please discuss with your pharmacist how to take the medication safely.  Take Eliquis exactly as prescribed by your doctor and DO NOT stop taking Eliquis without talking to the doctor who prescribed the medication.  Stopping may increase your risk of developing a stroke.  Refill your prescription before you run out.  After discharge, you should have regular check-up appointments with your healthcare provider that is prescribing your Eliquis.  In the future your dose may need to be changed if your kidney function or weight changes by a significant amount or as you get older.  What do you do if you miss a dose? If you miss a dose, take it as soon as you remember on the same day and resume taking twice daily.  Do not take more than one dose of ELIQUIS at the same time to make up a missed dose.  Important Safety Information A possible side effect of Eliquis is bleeding. You should call your healthcare provider right away if you experience any of the following: Bleeding from an injury or your nose that does not stop. Unusual colored urine (red or dark brown) or unusual colored stools (red or black). Unusual bruising for unknown reasons. A serious fall or if you hit your head (even if there is no bleeding).  Some medicines may interact with Eliquis and might increase your risk of bleeding or clotting while on Eliquis. To help avoid this, consult your healthcare provider or pharmacist prior to  using any new prescription or non-prescription medications, including herbals, vitamins, non-steroidal anti-inflammatory drugs (NSAIDs) and supplements.  This website has more information on Eliquis (apixaban): http://www.eliquis.com/eliquis/home

## 2024-07-24 NOTE — TOC Transition Note (Signed)
 Transition of Care [facility]) - Discharge Note   Patient Details  Name: GLENDOLA FRIEDHOFF MRN: [id number] Date of Birth: 05-Apr-1946  Transition of Care [facility]) CM/SW Contact:  Sharlyne Stabs, RN Phone Number: 07/24/2024, 10:29 AM   Clinical Narrative:   Patient discharging home with [facility] health, orders placed, [facility] updated. [employer] will deliver [facility].    Final next level of care: Home w Home Health Services Barriers to Discharge: Continued Medical Work up   Patient Goals and CMS Choice Patient states their goals for this hospitalization and ongoing recovery are:: To return home CMS Medicare.gov Compare Post Acute Care list provided to:: Patient Choice offered to / list presented to : Patient [place] ownership interest in [facility].provided to::  (NA)    Discharge Placement                    Patient and family notified of of transfer: 07/24/24  Discharge Plan and Services Additional resources added to the After Visit Summary for   In-house Referral: Clinical Social Work Discharge Planning Services: NA Post Acute Care Choice: Home Health, Durable Medical Equipment            DME Agency: [facility]    [facility] Agency: [facility]     Social Drivers of Health (SDOH) Interventions SDOH Screenings   Food Insecurity: Unknown (07/21/2024)  Housing: Unknown (07/21/2024)  Transportation Needs: No Transportation Needs (07/21/2024)  Utilities: Not At Risk (07/21/2024)  Social Connections: Unknown (07/21/2024)  Tobacco Use: Low Risk  (07/21/2024)     Readmission Risk Interventions    07/10/2024    2:18 PM 11/14/2022   11:52 AM  Readmission Risk Prevention Plan  Transportation Screening Complete Complete  PCP or Specialist Appt within 3-5 Days  Complete  HRI or Home Care Consult  Complete  Social Work Consult for Recovery Care Planning/Counseling  --  Palliative Care Screening  Not Applicable  Medication Review [employer])  Complete Complete  PCP or Specialist appointment within 3-5 days of discharge Complete   HRI or Home Care Consult Complete   SW Recovery Care/Counseling Consult Complete   Palliative Care Screening Not Applicable   Skilled Nursing Facility Patient Refused

## 2024-07-24 NOTE — Progress Notes (Signed)
   Progress Note  Patient Name: [name] Date of Encounter: 07/24/2024  Primary Cardiologist: Alvan Carrier, MD  [employer] loop recorder data downloaded till 07/23/2024. No arrhythmias noted. Syncope is non-cardiac. Please follow up on the cardiology consult note for additional recommendations.  Medication changes made yesterday: Decrease torsemide  dose from 40 mg to 20 mg and d/c Farxiga  (due to increased urinary frequency and recent UTIs).  Cardiology will SIGN OFF. Keep appt with Dr Alvan on 08/15/2024.  Signed, Diannah SHAUNNA [place], MD  07/24/2024, 9:06 AM

## 2024-07-24 NOTE — Plan of Care (Signed)

## 2024-07-24 NOTE — Discharge Summary (Signed)
 Physician Discharge Summary   Patient: [name] MRN: [id number] DOB: 1946/05/23  Admit date:     07/21/2024  Discharge date: 07/24/24  Discharge Physician: Adriana DELENA Grams   PCP: Sheryle Carwin, MD   Recommendations at discharge:   Follow-up with PCP in 1 week Follow-up with your cardiologist in 1-2 weeks Follow-up with the palliative care team Continue current medications, sedative sent by cardiology Discontinue Coumadin , start Eliquis as instructed   Discharge Diagnoses: Active Problems:   Gastroesophageal reflux disease   Mixed hyperlipidemia   Essential hypertension   Acquired hypothyroidism   Syncope   Intraparenchymal hematoma of brain (HCC)   Generalized weakness   Elevated d-dimer   Chronic kidney disease, stage 3b (HCC)   Chronic heart failure with preserved ejection fraction (HFpEF) (HCC)   Mood disorder   Hospital Course:  [name] is a 78 y.o. female with medical history significant of hypothyroidism, type 2 diabetes mellitus, COPD, HFpEF, A-fib/flutter on warfarin, rheumatic heart disease, AV and MV replacement, CVA, carotid artery stenosis, CKD-3B, lumbar laminectomy and compression and mild cognitive impairment who presents to the ED emergency department due to 4-day onset of worsening generalized weakness, subjective fever, nausea, diarrhea.  Patient usually ambulates within the house and without the need of any assistive device, but she has been requiring a rollator whereby she sits on it instead of walking with it due to weakness. Husband reported a syncopal episode while in the ED   She was admitted to [facility] from 9/26 to 10/8 due to syncope and collapse in which she sustained a head trauma and was found to have intraparenchymal hemorrhage (she is on warfarin).  Repeated MRI reviewed by neurosurgery at that time was stable and there was no need for surgical intervention.   She was seen in the ED on 10/11 due to UTI and was discharged home with  antibiotics.  She was doing fairly until she started to complain of weakness as described above.   ED Course:  sShe was hemodynamically stable.  Workup in the ED showed normocytic anemia.  BMP was normal except for chloride of 97, glucose 113, creatinine 1.58.  D-dimer 2.15, urinalysis was normal.  Influenza A, B, SARS coronavirus 2, RSV was negative. CT angiography of chest showed no pulmonary embolism. Chronic bilateral pleural effusions calcified plaques suggestive of prior asbestos exposure. Indeterminate chronic stable right hilar, left hilar, and prevascular mediastinal lymphadenopathy. Chest x-ray shows stable chest.  No acute process Patient was treated with calcium  carbonate.  TRH was asked to admit patient.     Syncope - Likely vasovagal -Patient has a loop recorder which has been interrogated, downloaded no events were recorded Troponins >>> 24, 27,-remaining flat, denies any chest pain Echocardiogram done on 06/29/2024 showed EF of 77 5%.  No WMA.  LV diastolic parameters indeterminate. CT angiogram -negative PE, CAD, chronic BL pleural effusion-calcified plaques suggestive of prior asbestos exposure.  Prevascular lymphadenopathy   Echocardiogram >>> 70 to 75%. The left ventricle has hyperdynamic function. The left ventricle has no regional wall motion abnormalities. The left ventricular internal cavity size was normal in size.  There is no left ventricular hypertrophy. Left ventricular diastolic parameters are indeterminate. Elevated left atrial pressure.   Carotid artery Dopplers >>> Results from 07/02/2024 Carotid US  at [facility] Summary:   Right  Carotid: Velocities in the right ICA are consistent with a 1-39%  stenosis.  Left Carotid: Velocities in the left ICA are consistent with a 1-39% stenosis.  Vertebrals: Bilateral vertebral arteries demonstrate antegrade flow.  Subclavians: Normal flow hemodynamics were seen in bilateral subclavian  arteries.    Generalized  weakness w ?  Syncopal episodes Protein supplement will be provided Continue fall precaution Consulted PT OT, recommended home health-has been arranged   Supratherapeutic INR INR 3.7, warfarin will be held at this time [facility] cardiology discontinue warfarin -Starting Eliquis once INR is less than 2   Hypomagnesemia Magnesium  1.6, this will be replenished   Prolonged QT interval QTc 524 ms Avoid QT prolonging drugs Electrolytes magnesium  and potassium was replaced   Elevated D-dimer D-dimer 2.15 CT angiograph of chest ruled out pulmonary embolism   CKD 3B -Stable Creatinine 1.58 (creatinine is within baseline range) >>> 1.30, 1.56 Renally adjust medications, avoid nephrotoxic agents/dehydration/hypotension   History of left intraparenchymal hemorrhage/SAH Stable. INR elevated at 3.7,, check an INR, goal 2-3 Records reviewed per neurosurgery okay to start anticoagulation Will start Eliquis when INR is less than 2   Chronic HFpEF - Daily weights and fluid restriction Continue heart healthy diet       Farxiga  -was discontinued by cardiology  Also Patient's home dose of Torsemide  reduced to 20 mg daily  TTE done on 06/29/2024 showed EF of 70-75%.  No RWMA.  Indeterminate LV diastolic parameters.   Paroxysmal A-fib/flutter Continue amiodarone  100 mg daily heart rate, 56    Essential hypertension (controlled) Continue home torsemide ,   Acquired hypothyroidism- Continue home Synthroid    GERD - Continue PPI   Mixed hyperlipidemia - Continue Lipitor   Mood disorder - Continue Lexapro .         Consultants: Neurology  Disposition: Home health Diet recommendation:  Discharge Diet Orders (From admission, onward)     Start     Ordered   07/23/24 0000  Diet - low sodium heart healthy        07/23/24 1037           Cardiac diet DISCHARGE MEDICATION: Allergies as of 07/24/2024   No Known Allergies      Medication List     PAUSE taking these  medications    bisoprolol  5 MG tablet Wait to take this until: July 30, 2024 Commonly known as: ZEBETA  Take 1 tablet (5 mg total) by mouth daily. What changed: when to take this       STOP taking these medications    calcium  carbonate 500 MG chewable tablet Commonly known as: TUMS - dosed in mg elemental calcium    famotidine  20 MG tablet Commonly known as: PEPCID    Farxiga  10 MG Tabs tablet Generic drug: dapagliflozin  propanediol   fexofenadine 180 MG tablet Commonly known as: ALLEGRA   fluconazole 150 MG tablet Commonly known as: DIFLUCAN   fluticasone  50 MCG/ACT nasal spray Commonly known as: FLONASE    warfarin 2.5 MG tablet Commonly known as: COUMADIN        TAKE these medications    acetaminophen  325 MG tablet Commonly known as: Tylenol  Take 2 tablets (650 mg total) by mouth every 6 (six) hours as needed.   albuterol  108 (90 Base) MCG/ACT inhaler Commonly known as: VENTOLIN  HFA Inhale 2 puffs into the lungs every 6 (six) hours as needed for wheezing or shortness of breath.   amiodarone  200 MG tablet Commonly known as: Pacerone  Take 1 tablet (200 mg total) by mouth daily. What changed: when to take this   apixaban 5 MG Tabs tablet Commonly known as: ELIQUIS Take 1 tablet (5 mg total) by mouth 2 (two) times daily. Start  taking on: July 26, 2024   atorvastatin  40 MG tablet Commonly known as: LIPITOR Take 1 tablet (40 mg total) by mouth daily.   azelastine  0.1 % nasal spray Commonly known as: ASTELIN  Place 1 spray into both nostrils 2 (two) times daily. Use in each nostril as directed What changed:  when to take this reasons to take this   escitalopram  10 MG tablet Commonly known as: LEXAPRO  Take 10 mg by mouth daily with breakfast.   feeding supplement Liqd Take 237 mLs by mouth 2 (two) times daily between meals.   levothyroxine  100 MCG tablet Commonly known as: SYNTHROID  Take 100 mcg by mouth daily before breakfast.   metFORMIN  500  MG tablet Commonly known as: GLUCOPHAGE  Take 1 tablet (500 mg total) by mouth 2 (two) times daily with a meal.   multivitamin with minerals Tabs tablet Take 1 tablet by mouth in the morning.   ondansetron  4 MG disintegrating tablet Commonly known as: ZOFRAN -ODT Take 4 mg by mouth every 6 (six) hours as needed for nausea or vomiting.   oxyCODONE  5 MG immediate release tablet Commonly known as: Oxy IR/ROXICODONE  Take 1 tablet (5 mg total) by mouth every 6 (six) hours as needed for moderate pain (pain score 4-6).   pantoprazole  40 MG tablet Commonly known as: PROTONIX  TAKE 1 TABLET 30 TO 60 MINUTES BEFORE FIRST MEAL OF THE DAY. What changed: See the new instructions.   potassium chloride  SA 20 MEQ tablet Commonly known as: KLOR-CON  M Take 1 tablet (20 mEq total) by mouth daily. What changed: when to take this   SYSTANE OP Place 1 drop into both eyes daily as needed (dry eyes).   torsemide  20 MG tablet Commonly known as: DEMADEX  Take 1 tablet (20 mg total) by mouth daily. Start taking on: July 25, 2024 What changed:  how much to take how to take this when to take this additional instructions   Trelegy Ellipta  100-62.5-25 MCG/ACT Aepb Generic drug: Fluticasone -Umeclidin-Vilant Inhale 1 puff into the lungs daily.               Durable Medical Equipment  (From admission, onward)           Start     Ordered   07/22/24 1358  For home use only DME Bedside commode  Once       Question:  Patient needs a bedside commode to treat with the following condition  Answer:  Debility   07/22/24 1416            Follow-up Information     [facility] Follow up.   Specialty: Home Health Services Why: PT will call to schedule your home visit. Contact information: [address] [place] [address] [phone]         [facility] Follow up.   Why: Bed Side Commode to be delivered               Discharge Exam: Filed  Weights   07/21/24 1627 07/21/24 2150  Weight: 69.9 kg 67.2 kg        General:  AAO x 2,  cooperative, no distress;   HEENT:  Normocephalic, PERRL, otherwise with in Normal limits   Neuro:  CNII-XII intact. , normal motor and sensation, reflexes intact   Lungs:   Clear to auscultation BL, Respirations unlabored,  No wheezes / crackles  Cardio:    S1/S2, RRR, No murmure, No Rubs or Gallops   Abdomen:  Soft, non-tender, bowel  sounds active all four quadrants, no guarding or peritoneal signs.  Muscular  skeletal:  Limited exam -global generalized weaknesses - in bed, able to move all 4 extremities,   2+ pulses,  symmetric, No pitting edema  Skin:  Dry, warm to touch, negative for any Rashes,  Wounds: Please see nursing documentation          Condition at discharge: fair  The results of significant diagnostics from this hospitalization (including imaging, microbiology, ancillary and laboratory) are listed below for reference.   Imaging Studies: ECHOCARDIOGRAM LIMITED Result Date: 07/22/2024    ECHOCARDIOGRAM LIMITED REPORT   Patient Name:   KAMYLLE AXELSON Date of Exam: 07/22/2024 Medical Rec #:  [id number]       Height:       63.0 in Accession #:    [id number]      Weight:       148.1 lb Date of Birth:  Feb 06, 1946       BSA:          1.702 m Patient Age:    78 years        BP:           121/33 mmHg Patient Gender: F               HR:           59 bpm. Exam Location:  [facility] Procedure: 2D Echo, Cardiac Doppler and Color Doppler (Both Spectral and Color            Flow Doppler were utilized during procedure). Indications:    Syncope R55  History:        Patient has prior history of Echocardiogram examinations, most                 recent 06/29/2024. CHF, Stroke and COPD, Arrythmias:Atrial                 Fibrillation and Atrial Flutter, Signs/Symptoms:Syncope; Risk                 Factors:Hypertension, Diabetes and Dyslipidemia. Hx of Breast                 carcinoma (HCC) and aortic  & mitral valve replacement with                 bioprosthetic valves (From Hx).                 Aortic Valve: 21 mm Magna Ease valve is present in the aortic                 position. Procedure Date: 07/18/2013.                 Mitral Valve: 25 mm Magna Ease bioprosthetic valve valve is                 present in the mitral position. Procedure Date: 07/18/2013.  Sonographer:    Aida Pizza RCS Referring Phys: [id number] OLADAPO ADEFESO IMPRESSIONS  1. Left ventricular ejection fraction, by estimation, is 60 to 65%. The left ventricle has normal function. The left ventricle has no regional wall motion abnormalities. Left ventricular diastolic function could not be evaluated.  2. Right ventricular systolic function is normal. The right ventricular size is normal. There is normal pulmonary artery systolic pressure.  3. The mitral valve has been repaired/replaced. No evidence of mitral valve regurgitation. The mean mitral valve gradient is 5.0 mmHg. There is a  25 mm Magna Ease bioprosthetic valve present in the mitral position. Procedure Date: 07/18/2013.  4. The aortic valve has been repaired/replaced. Aortic valve regurgitation is not visualized. No aortic stenosis is present. There is a 21 mm Magna Ease valve present in the aortic position. Procedure Date: 07/18/2013. Aortic valve mean gradient measures 17.3 mmHg. Compared to March 2025 Echo, mean gradient across aortic prosthesis has significantly increased from 8 mm Hg to 17.3 mm Hg now.  5. The inferior vena cava is normal in size with greater than 50% respiratory variability, suggesting right atrial pressure of 3 mmHg. FINDINGS  Left Ventricle: Left ventricular ejection fraction, by estimation, is 60 to 65%. The left ventricle has normal function. The left ventricle has no regional wall motion abnormalities. The left ventricular internal cavity size was normal in size. There is  no left ventricular hypertrophy. Left ventricular diastolic function could not be  evaluated. Right Ventricle: The right ventricular size is normal. No increase in right ventricular wall thickness. Right ventricular systolic function is normal. There is normal pulmonary artery systolic pressure. The tricuspid regurgitant velocity is 2.44 m/s, and  with an assumed right atrial pressure of 3 mmHg, the estimated right ventricular systolic pressure is 26.8 mmHg. Left Atrium: Left atrial size was normal in size. Right Atrium: Right atrial size was normal in size. Pericardium: There is no evidence of pericardial effusion. Mitral Valve: The mitral valve has been repaired/replaced. There is a 25 mm Magna Ease bioprosthetic valve present in the mitral position. Procedure Date: 07/18/2013. MV peak gradient, 16.2 mmHg. The mean mitral valve gradient is 5.0 mmHg. Tricuspid Valve: The tricuspid valve is normal in structure. Tricuspid valve regurgitation is mild . No evidence of tricuspid stenosis. Aortic Valve: The aortic valve has been repaired/replaced. Aortic valve regurgitation is not visualized. No aortic stenosis is present. Aortic valve mean gradient measures 17.3 mmHg. Aortic valve peak gradient measures 28.4 mmHg. Aortic valve area, by VTI measures 0.59 cm. There is a 21 mm Magna Ease valve present in the aortic position. Procedure Date: 07/18/2013. Pulmonic Valve: The pulmonic valve was normal in structure. Pulmonic valve regurgitation is not visualized. No evidence of pulmonic stenosis. Aorta: The aortic root is normal in size and structure. Venous: The inferior vena cava is normal in size with greater than 50% respiratory variability, suggesting right atrial pressure of 3 mmHg. IAS/Shunts: No atrial level shunt detected by color flow Doppler. LEFT VENTRICLE PLAX 2D LVIDd:         3.50 cm LVIDs:         2.30 cm LV PW:         1.10 cm LV IVS:        0.80 cm LVOT diam:     1.60 cm LV SV:         40 LV SV Index:   24 LVOT Area:     2.01 cm  RIGHT VENTRICLE TAPSE (M-mode): 1.2 cm LEFT ATRIUM          Index LA diam:    3.50 cm 2.06 cm/m  AORTIC VALVE AV Area (Vmax):    0.61 cm AV Area (Vmean):   0.61 cm AV Area (VTI):     0.59 cm AV Vmax:           266.50 cm/s AV Vmean:          192.000 cm/s AV VTI:            0.685 m AV Peak Grad:  28.4 mmHg AV Mean Grad:      17.3 mmHg LVOT Vmax:         80.23 cm/s LVOT Vmean:        58.300 cm/s LVOT VTI:          0.201 m LVOT/AV VTI ratio: 0.29  AORTA Ao Root diam: 2.70 cm MITRAL VALVE                TRICUSPID VALVE MV Area (PHT): 1.83 cm     TR Peak grad:   23.8 mmHg MV Area VTI:   0.58 cm     TR Vmax:        244.00 cm/s MV Peak grad:  16.2 mmHg MV Mean grad:  5.0 mmHg     SHUNTS MV Vmax:       2.01 m/s     Systemic VTI:  0.20 m MV Vmean:      102.0 cm/s   Systemic Diam: 1.60 cm MV Decel Time: 415 msec MV E velocity: 191.00 cm/s MV A velocity: 79.50 cm/s MV E/A ratio:  2.40 Vishnu Priya Mallipeddi Electronically signed by Diannah Late Mallipeddi Signature Date/Time: 07/22/2024/3:50:40 PM    Final    US  Carotid Bilateral Result Date: 07/22/2024 CLINICAL DATA:  Syncope, cerebral hemorrhage, hypertension, hyperlipidemia and diabetes. EXAM: BILATERAL CAROTID DUPLEX ULTRASOUND TECHNIQUE: Elnor scale imaging, color Doppler and duplex ultrasound were performed of bilateral carotid and vertebral arteries in the neck. COMPARISON:  11/11/2022 FINDINGS: Criteria: Quantification of carotid stenosis is based on velocity parameters that correlate the residual internal carotid diameter with NASCET-based stenosis levels, using the diameter of the distal internal carotid lumen as the denominator for stenosis measurement. The following velocity measurements were obtained: RIGHT ICA:  75/19 cm/sec CCA:  55/5 cm/sec SYSTOLIC ICA/CCA RATIO:  1.4 ECA:  56 cm/sec LEFT ICA:  97/20 cm/sec CCA:  57/8 cm/sec SYSTOLIC ICA/CCA RATIO:  1.7 ECA:  59 cm/sec RIGHT CAROTID ARTERY: Similar to slightly more prominent calcified plaque at the level of the carotid bulb and proximal right ICA.  Estimated right ICA stenosis is less than 50%. RIGHT VERTEBRAL ARTERY: Antegrade flow with normal waveform and velocity. LEFT CAROTID ARTERY: Similar mild amount of calcified plaque at the level of the left carotid bulb and left ICA origin. Estimated left ICA stenosis is less than 50%. LEFT VERTEBRAL ARTERY: Antegrade flow with normal waveform and velocity. IMPRESSION: 1. Similar to slightly more prominent calcified plaque at the level of the right carotid bulb and proximal right ICA. Estimated right ICA stenosis is less than 50%. 2. Similar mild amount of calcified plaque at the level of the left carotid bulb and left ICA origin. Estimated left ICA stenosis is less than 50%. Electronically Signed   By: Marcey Moan M.D.   On: 07/22/2024 14:59   CT Angio Chest PE W and/or Wo Contrast Result Date: 07/21/2024 EXAM: CTA CHEST 07/21/2024 07:16:17 PM TECHNIQUE: CTA of the chest was performed after the administration of intravenous contrast. Multiplanar reformatted images are provided for review. MIP images are provided for review. Automated exposure control, iterative reconstruction, and/or weight based adjustment of the mA/kV was utilized to reduce the radiation dose to as low as reasonably achievable. COMPARISON: CT angiography chest 08:30 11/10/2020 CLINICAL HISTORY: Pulmonary embolism (PE) suspected, low to intermediate prob, positive D-dimer. Table formatting from the original note was not included.; BIB family from home for subjective fever, nausea, non-productive cough, diarrhea and body aches. Rates pain 4/10. Denies other sx. No meds PTA. Steady  gait. Alert, NAD, calm, interactive, resps e/u, speaking in clear complete sentences. FINDINGS: PULMONARY ARTERIES: Pulmonary arteries are adequately opacified for evaluation. No acute pulmonary embolus. Main pulmonary artery is normal in caliber. MEDIASTINUM: Aortic and mitral annular valve replacements. Prominent heart size. 4-vessel coronary artery  calcification. Severe atherosclerotic plaque of the thoracic aorta. The pericardium demonstrates no acute abnormality. LYMPH NODES: Chronic stable Right hilar lymphadenopathy measuring up to 1.1 cm. Chronic stable Left hilar lymphadenopathy measuring up to 1.2 cm. Chronic stable Prevascular mediastinal lymphadenopathy measuring up to 1 cm. No axillary lymphadenopathy. LUNGS AND PLEURA: Biapical pleural/pulmonary scarring. Pleural calcifications noted bilaterally. Chronic stable Subpleural left lower lobe pulmonary micronodule (series 6, image 57) -no further follow-up indicated . Subpleural triangular micronodule along the right minor fissure likely an intrapulmonary lymph node no further follow-up indicated . Reticulations along the right anterior upper lobe likely sequelae of prior radiation therapy. No evidence of pleural effusion or pneumothorax. UPPER ABDOMEN: Colonic diverticulosis. SOFT TISSUES AND BONES: Right breast surgical changes suggestive of lumpectomy. No acute bone or soft tissue abnormality. IMPRESSION: 1. No pulmonary embolism. 2. Chronic bilateral pleural effusions calcified plaques suggestive of prior asbestos exposure. 3. Indeterminate chronic stable right hilar, left hilar, and prevascular mediastinal lymphadenopathy. 4. Severe atherosclerotic plaque of the thoracic aorta and 4-vessel coronary artery calcification. Electronically signed by: Morgane Naveau MD 07/21/2024 07:32 PM EDT RP Workstation: [id number]   DG Chest 2 View Result Date: 07/21/2024 CLINICAL DATA:  Short of breath, fever EXAM: CHEST - 2 VIEW COMPARISON:  06/30/2024 FINDINGS: Frontal and lateral views of the chest demonstrates stable postsurgical changes from median sternotomy and mitral and aortic valve replacement. The cardiac silhouette is enlarged but stable. Chronic central vascular congestion without airspace disease, effusion, or pneumothorax. No acute bony abnormalities. IMPRESSION: 1. Stable chest, no acute process.  Electronically Signed   By: Ozell Daring M.D.   On: 07/21/2024 17:50   CT Head Wo Contrast Result Date: 07/13/2024 CLINICAL DATA:  Head trauma, focal neuro findings (Age 81-64y) recent ICH. Headache. EXAM: CT HEAD WITHOUT CONTRAST TECHNIQUE: Contiguous axial images were obtained from the base of the skull through the vertex without intravenous contrast. RADIATION DOSE REDUCTION: This exam was performed according to the departmental dose-optimization program which includes automated exposure control, adjustment of the mA and/or kV according to patient size and/or use of iterative reconstruction technique. COMPARISON:  07/06/2024 FINDINGS: Brain: Previously seen left frontal intracerebral hemorrhage has resolved. Old right frontal infarct with encephalomalacia, stable. There is atrophy and chronic small vessel disease changes. No acute intracranial abnormality. Specifically, no hemorrhage, hydrocephalus, mass lesion, acute infarction, or significant intracranial injury. Vascular: No hyperdense vessel or unexpected calcification. Skull: No acute calvarial abnormality. Sinuses/Orbits: No acute findings Other: None IMPRESSION: Resolution of the previously seen left frontal ICH. Old right frontal infarct. Atrophy, chronic microvascular disease. No acute intracranial abnormality. Electronically Signed   By: Franky Crease M.D.   On: 07/13/2024 17:27   CT HEAD WO CONTRAST ( ) Result Date: 07/06/2024 EXAM: CT HEAD WITHOUT CONTRAST 07/06/2024 07:57:24 PM TECHNIQUE: CT of the head was performed without the administration of intravenous contrast. Automated exposure control, iterative reconstruction, and/or weight based adjustment of the mA/kV was utilized to reduce the radiation dose to as low as reasonably achievable. COMPARISON: 07/04/2024 CLINICAL HISTORY: Ataxia, head trauma. FINDINGS: BRAIN AND VENTRICLES: No acute hemorrhage. Near complete resolution of left frontal lobe intraparenchymal hemorrhage. Volume loss  and chronic ischemic white matter changes. Old right frontal lobe infarct. No evidence of acute  infarct. No hydrocephalus. No extra-axial collection. No mass effect or midline shift. ORBITS: Incompletely visualized left orbital floor fracture. SINUSES: No acute abnormality. SOFT TISSUES AND SKULL: No acute soft tissue abnormality. No skull fracture. IMPRESSION: 1. Near complete resolution of left frontal lobe intraparenchymal hemorrhage. 2. Incompletely visualized left orbital floor fracture. 3. Old right frontal lobe infarct. 4. Volume loss and chronic ischemic white matter changes. Electronically signed by: Franky Stanford MD 07/06/2024 08:23 PM EDT RP Workstation: [id number]   CT HEAD WO CONTRAST ( ) Result Date: 07/04/2024 EXAM: CT HEAD WITHOUT CONTRAST 07/04/2024 02:13:14 PM TECHNIQUE: CT of the head was performed without the administration of intravenous contrast. Automated exposure control, iterative reconstruction, and/or weight based adjustment of the mA/kV was utilized to reduce the radiation dose to as low as reasonably achievable. COMPARISON: MRI head and CT head 06/28/2024. CLINICAL HISTORY: Headache, sudden, severe. FINDINGS: BRAIN AND VENTRICLES: Redemonstrated focus of intraparenchymal hemorrhage in the left frontal lobe white matter which measures 8 x 3 x 8 mm on the current study, previously measuring 7 x 5 x 6 mm. The focus of hemorrhage appears slightly more diffuse and decreased in attenuation compared to prior. There is mild surrounding edema. There are no new areas of intracranial hemorrhage. Nonspecific hypoattenuation in the periventricular and subcortical white matter, most likely representing chronic microvascular ischemic changes. Redemonstrated encephalomalacia in the right superior frontal gyrus suggestive of remote infarct. Additional remote infarcts in the left cerebellum. Mild parenchymal volume loss. No hydrocephalus. No extra-axial collection. No mass effect or midline shift.  ORBITS: Focal hematoma over the left supraorbital ridge partially extending into the preseptal soft tissues of the left orbit. Left orbital floor fracture and fracture of the left lamina papyracea, better evaluated on recent CT maxillofacial. SINUSES: Fluid and mucosal thickening in the left ethmoid and left maxillary sinuses. SOFT TISSUES AND SKULL: Focal hematoma in the left forehead. Edema in the partially visualized left facial soft tissues. No skull fracture. IMPRESSION: 1. Evolving intraparenchymal hemorrhage in the left frontal lobe with mild surrounding edema. 2. No new areas of intracranial hemorrhage. 3. Left orbital floor fracture and fracture of the left lamina papyracea, better evaluated on recent CT maxillofacial. 4. Focal hematoma in the left forehead and over the left supraorbital ridge. 5. Chronic changes as above. Electronically signed by: Donnice Mania MD 07/04/2024 02:51 PM EDT RP Workstation: [id number]   DG Thoracic Spine 2 View Result Date: 07/03/2024 EXAM: 2 VIEW(S) XRAY OF THE THORACIC SPINE 07/03/2024 04:42:00 PM COMPARISON: None available. CLINICAL HISTORY: Fall 190176; 91320 Back pain [id number]. Reason for exam: Fall on 07/03/2024. FINDINGS: BONES: No radiographic evidence of compression fracture. No aggressive appearing osseous lesion. Alignment is normal. DISCS AND DEGENERATIVE CHANGES: Mild degenerative endplate osteophytes and disc space narrowing at multiple levels. SOFT TISSUES: Median sternotomy wires aortic and mitral valve replacements partially visualized loop recorder overlying the left chest. The visualized lungs are clear. IMPRESSION: 1. No acute abnormality of the thoracic spine related to the reported fall. 2. Mild degenerative endplate osteophytes and disc space narrowing at multiple levels. Electronically signed by: Donnice Mania MD 07/03/2024 06:17 PM EDT RP Workstation: [id number]   DG Shoulder Right Result Date: 07/03/2024 EXAM: 1 VIEW XRAY OF THE RIGHT SHOULDER  07/03/2024 04:42:00 PM COMPARISON: Shoulder radiograph 2 924. CLINICAL HISTORY: Fall 190176; 91320 Back pain 08679. Reason for exam: Fall on 10.01.25. FINDINGS: BONES AND JOINTS: Glenohumeral joint is normally aligned. No acute fracture or dislocation. Mild degenerative changes of the acromioclavicular joint. Partially imaged median sternotomy  wires. SOFT TISSUES: Right axillary surgical clips noted. No abnormal calcifications. Visualized lung is unremarkable. IMPRESSION: 1. No acute fracture or dislocation. 2. Mild degenerative changes of the acromioclavicular joint. Electronically signed by: Donnice Mania MD 07/03/2024 06:15 PM EDT RP Workstation: [id number]   DG Lumbar Spine 2-3 Views Result Date: 07/03/2024 EXAM: 2 or 3 VIEW(S) XRAY OF THE LUMBAR SPINE 07/03/2024 04:42:00 PM COMPARISON: Lumbar spine radiograph 05/13/2014. CLINICAL HISTORY: Fall on 07/03/2024. Back pain. FINDINGS: LUMBAR SPINE: BONES: No acute fracture. No aggressive appearing osseous lesion. Alignment is normal. Degenerative endplate osteophytes at multiple levels. DISCS AND DEGENERATIVE CHANGES: Facet arthrosis greatest at L4-L5 and L5-S1. There is mild disc space narrowing at L4-L5 and L5-S1. SOFT TISSUES: Vascular calcifications. No acute abnormality. IMPRESSION: 1. No acute abnormality of the lumbar spine related to the fall. 2. Degenerative changes including mild disc space narrowing at L4-5 and L5-S1, endplate osteophytes, and facet arthrosis greatest at L4-5 and L5-S1. Electronically signed by: Donnice Mania MD 07/03/2024 06:13 PM EDT RP Workstation: [id number]   EP PPM/ICD IMPLANT Result Date: 07/02/2024 SURGEON:  Eulas FORBES Furbish, MD   PREPROCEDURE DIAGNOSIS:  Syncope, atrial fibrillation   POSTPROCEDURE DIAGNOSIS: Syncope, atrial fibrillation      PROCEDURES:  1. Implantable loop recorder implantation   INTRODUCTION:  KENSINGTON RIOS presents with a history of syncope The costs of loop recorder monitoring have been discussed with  the patient.   DESCRIPTION OF PROCEDURE:  Informed written consent was obtained.  A timeout was performed. The patient required no sedation for the procedure today. The patients left chest was prepped and draped in the usual sterile fashion. The skin overlying the left parasternal region was infiltrated with lidocaine  for local analgesia.  A 0.5-cm incision was made over the left parasternal region over the 3rd intercostal space.  A subcutaneous ILR pocket was fashioned using a combination of sharp and blunt dissection.  A [employer] Reveal LINQ 2 implantable loop recorder was then placed into the pocket  R waves were very prominent and measured >0.31mV.  Steri- Strips and a sterile dressing were then applied.  There were no early apparent complications.   CONCLUSIONS:  1. Successful implantation of a implantable loop recorder for a history of suspected syncope, atrial fibrillation, prolonged QT  2. No early apparent complications. Eulas FORBES Furbish, MD Cardiac Electrophysiology    VAS US  CAROTID Result Date: 07/02/2024 Carotid Arterial Duplex Study Patient Name:  [name]  Date of Exam:   07/02/2024 Medical Rec #: [id number]        Accession #:    [id number] Date of Birth: 1945/11/24        Patient Gender: F Patient Age:   18 years Exam Location:  [facility] Procedure:      VAS US  CAROTID Referring Phys: MIGNON GONFA --------------------------------------------------------------------------------  Indications:       CVA, Carotid artery disease and Syncope. Risk Factors:      Hypertension, hyperlipidemia, no history of smoking. Comparison Study:  Previous study on 2.9.2024. Performing Technologist: Edilia Elden Appl  Examination Guidelines: A complete evaluation includes B-mode imaging, spectral Doppler, color Doppler, and power Doppler as needed of all accessible portions of each vessel. Bilateral testing is considered an integral part of a complete examination. Limited examinations for reoccurring  indications may be performed as noted.  Right Carotid Findings: +----------+--------+--------+--------+-------------------------+--------+           PSV cm/sEDV cm/sStenosisPlaque Description       Comments +----------+--------+--------+--------+-------------------------+--------+ CCA Prox  89  12                                                +----------+--------+--------+--------+-------------------------+--------+ CCA Distal74      16              hyperechoic and smooth            +----------+--------+--------+--------+-------------------------+--------+ ICA Prox  77      16              heterogenous and calcific         +----------+--------+--------+--------+-------------------------+--------+ ICA Mid   68      18                                                +----------+--------+--------+--------+-------------------------+--------+ ICA Distal93      18                                                +----------+--------+--------+--------+-------------------------+--------+ ECA       106     13                                                +----------+--------+--------+--------+-------------------------+--------+ +----------+--------+-------+----------------+-------------------+           PSV cm/sEDV cmsDescribe        Arm Pressure (mmHG) +----------+--------+-------+----------------+-------------------+ Subclavian171            Multiphasic, WNL                    +----------+--------+-------+----------------+-------------------+ +---------+--------+--+--------+--+---------+ VertebralPSV cm/s56EDV cm/s13Antegrade +---------+--------+--+--------+--+---------+  Left Carotid Findings: +----------+--------+--------+--------+-------------------------+--------+           PSV cm/sEDV cm/sStenosisPlaque Description       Comments +----------+--------+--------+--------+-------------------------+--------+ CCA Prox  101     14                                                 +----------+--------+--------+--------+-------------------------+--------+ CCA Distal107     17              heterogenous                      +----------+--------+--------+--------+-------------------------+--------+ ICA Prox  81      17              heterogenous and calcific         +----------+--------+--------+--------+-------------------------+--------+ ICA Mid   94      25                                                +----------+--------+--------+--------+-------------------------+--------+ ICA Distal73      17                                                +----------+--------+--------+--------+-------------------------+--------+  ECA       124     15                                                +----------+--------+--------+--------+-------------------------+--------+ +----------+--------+--------+----------------+-------------------+           PSV cm/sEDV cm/sDescribe        Arm Pressure (mmHG) +----------+--------+--------+----------------+-------------------+ Subclavian160             Multiphasic, WNL                    +----------+--------+--------+----------------+-------------------+ +---------+--------+--+--------+-+---------+ VertebralPSV cm/s29EDV cm/s9Antegrade +---------+--------+--+--------+-+---------+   Summary: Right Carotid: Velocities in the right ICA are consistent with a 1-39% stenosis. Left Carotid: Velocities in the left ICA are consistent with a 1-39% stenosis. Vertebrals:  Bilateral vertebral arteries demonstrate antegrade flow. Subclavians: Normal flow hemodynamics were seen in bilateral subclavian              arteries. *See table(s) above for measurements and observations.  Electronically signed by Debby Robertson on 07/02/2024 at 4:08:32 PM.    Final    ECHOCARDIOGRAM COMPLETE Result Date: 06/29/2024    ECHOCARDIOGRAM REPORT   Patient Name:   [name] Date of Exam: 06/29/2024 Medical  Rec #:  [id number]       Height:       63.0 in Accession #:    [id number]      Weight:       150.0 lb Date of Birth:  1946-03-21       BSA:          1.711 m Patient Age:    78 years        BP:           110/48 mmHg Patient Gender: F               HR:           56 bpm. Exam Location:  Inpatient Procedure: 2D Echo, Cardiac Doppler and Color Doppler (Both Spectral and Color            Flow Doppler were utilized during procedure). Indications:    Syncope R55  History:        Patient has prior history of Echocardiogram examinations, most                 recent 12/19/2023. Stroke and COPD, Arrythmias:Atrial                 Fibrillation and Atrial Flutter, Signs/Symptoms:Chest Pain and                 Syncope; Risk Factors:Hypertension, Diabetes and Dyslipidemia.                 Aortic Valve: 21 mm Edwards valve is present in the aortic                 position. Procedure Date: 07/18/2013.                 Mitral Valve: 25 mm Edwards bioprosthetic valve valve is present                 in the mitral position. Procedure Date: 07/18/2013.  Sonographer:    Thea Norlander RCS Referring Phys: MIGNON ONEIDA BUMP IMPRESSIONS  1. S/P MVR with mean gradient 8 mmHg and no MR;  s/p AVR with mean gradient 13 mmHg and no AI.  2. Left ventricular ejection fraction, by estimation, is 70 to 75%. The left ventricle has hyperdynamic function. The left ventricle has no regional wall motion abnormalities. Left ventricular diastolic parameters are indeterminate. Elevated left atrial  pressure.  3. Right ventricular systolic function is moderately reduced. The right ventricular size is mildly enlarged.  4. Left atrial size was severely dilated.  5. Right atrial size was mildly dilated.  6. The mitral valve has been repaired/replaced. No evidence of mitral valve regurgitation. Mild mitral stenosis. There is a 25 mm Edwards bioprosthetic valve present in the mitral position. Procedure Date: 07/18/2013.  7. The aortic valve has been repaired/replaced.  Aortic valve regurgitation is not visualized. No aortic stenosis is present. There is a 21 mm Edwards valve present in the aortic position. Procedure Date: 07/18/2013.  8. The inferior vena cava is normal in size with greater than 50% respiratory variability, suggesting right atrial pressure of 3 mmHg. FINDINGS  Left Ventricle: Left ventricular ejection fraction, by estimation, is 70 to 75%. The left ventricle has hyperdynamic function. The left ventricle has no regional wall motion abnormalities. The left ventricular internal cavity size was normal in size. There is no left ventricular hypertrophy. Left ventricular diastolic parameters are indeterminate. Elevated left atrial pressure. Right Ventricle: The right ventricular size is mildly enlarged. Right ventricular systolic function is moderately reduced. Left Atrium: Left atrial size was severely dilated. Right Atrium: Right atrial size was mildly dilated. Pericardium: Trivial pericardial effusion is present. Mitral Valve: The mitral valve has been repaired/replaced. No evidence of mitral valve regurgitation. There is a 25 mm Edwards bioprosthetic valve present in the mitral position. Procedure Date: 07/18/2013. Mild mitral valve stenosis. MV peak gradient, 25.9 mmHg. The mean mitral valve gradient is 8.0 mmHg. Tricuspid Valve: The tricuspid valve is normal in structure. Tricuspid valve regurgitation is trivial. No evidence of tricuspid stenosis. Aortic Valve: The aortic valve has been repaired/replaced. Aortic valve regurgitation is not visualized. No aortic stenosis is present. Aortic valve mean gradient measures 12.5 mmHg. Aortic valve peak gradient measures 24.4 mmHg. Aortic valve area, by VTI measures 1.55 cm. There is a 21 mm Edwards valve present in the aortic position. Procedure Date: 07/18/2013. Pulmonic Valve: The pulmonic valve was normal in structure. Pulmonic valve regurgitation is trivial. No evidence of pulmonic stenosis. Aorta: The aortic root is  normal in size and structure. Venous: The inferior vena cava is normal in size with greater than 50% respiratory variability, suggesting right atrial pressure of 3 mmHg. IAS/Shunts: No atrial level shunt detected by color flow Doppler. Additional Comments: S/P MVR with mean gradient 8 mmHg and no MR; s/p AVR with mean gradient 13 mmHg and no AI.  LEFT VENTRICLE PLAX 2D LVIDd:         3.80 cm   Diastology LVIDs:         2.40 cm   LV e' medial:   6.42 cm/s LV PW:         1.10 cm   LV E/e' medial: 29.8 LV IVS:        0.70 cm LVOT diam:     2.10 cm LV SV:         88 LV SV Index:   52 LVOT Area:     3.46 cm  RIGHT VENTRICLE            IVC RV S prime:     9.73 cm/s  IVC diam: 2.20 cm  TAPSE (M-mode): 1.0 cm LEFT ATRIUM             Index        RIGHT ATRIUM           Index LA diam:        3.30 cm 1.93 cm/m   RA Area:     18.20 cm LA Vol (A2C):   59.6 ml 34.86 ml/m  RA Volume:   49.10 ml  28.69 ml/m LA Vol (A4C):   85.6 ml 50.03 ml/m LA Biplane Vol: 75.7 ml 44.24 ml/m  AORTIC VALVE AV Area (Vmax):    1.65 cm AV Area (Vmean):   1.67 cm AV Area (VTI):     1.55 cm AV Vmax:           247.00 cm/s AV Vmean:          160.500 cm/s AV VTI:            0.568 m AV Peak Grad:      24.4 mmHg AV Mean Grad:      12.5 mmHg LVOT Vmax:         118.00 cm/s LVOT Vmean:        77.400 cm/s LVOT VTI:          0.255 m LVOT/AV VTI ratio: 0.45  AORTA Ao Root diam: 2.70 cm Ao Asc diam:  3.10 cm MITRAL VALVE MV Area (PHT): 1.48 cm     SHUNTS MV Area VTI:   1.02 cm     Systemic VTI:  0.26 m MV Peak grad:  25.9 mmHg    Systemic Diam: 2.10 cm MV Mean grad:  8.0 mmHg MV Vmax:       2.55 m/s MV Vmean:      128.3 cm/s MV Decel Time: 511 msec MV E velocity: 191.00 cm/s MV A velocity: 84.80 cm/s MV E/A ratio:  2.25 Redell Shallow MD Electronically signed by Redell Shallow MD Signature Date/Time: 06/29/2024/12:53:19 PM    Final    MR Cervical Spine Wo Contrast Result Date: 06/28/2024 CLINICAL DATA:  Initial evaluation for acute ataxia. EXAM: MRI  CERVICAL SPINE WITHOUT CONTRAST TECHNIQUE: Multiplanar, multisequence MR imaging of the cervical spine was performed. No intravenous contrast was administered. COMPARISON:  CT from earlier the same day. FINDINGS: Alignment: Straightening with slight reversal of the normal cervical lordosis. No significant listhesis. Vertebrae: Mild chronic height loss with endplate Schmorl's node deformity noted at the superior endplate of T2. Vertebral body height otherwise maintained without acute or recent fracture. Bone marrow signal intensity within normal limits. No worrisome osseous lesions. No abnormal marrow edema. Cord: Normal signal and morphology. Posterior Fossa, vertebral arteries, paraspinal tissues: Patchy signal abnormality within the pons, most characteristic of chronic micro ischemic disease. Few small remote left cerebellar infarcts. Craniocervical junction normal. Paraspinous soft tissues within normal limits. Normal flow voids seen within the vertebral arteries bilaterally. Disc levels: C2-C3: Negative interspace. Mild to moderate left greater than right facet hypertrophy. No stenosis. C3-C4: Central disc protrusion indents the ventral thecal sac. Moderate right with mild left facet hypertrophy. No spinal stenosis. Foramina remain patent. C4-C5: Left paracentral disc protrusion indents the ventral thecal sac. Minimal cord flattening without cord signal changes. Mild spinal stenosis. Foramina remain patent. C5-C6: Degenerative disc space narrowing with diffuse disc osteophyte complex. Flattening and partial effacement of the ventral thecal sac, asymmetric to the right. Mild spinal stenosis. Severe left with moderate right C6 foraminal narrowing. C6-C7: Degenerative intervertebral disc space narrowing with diffuse disc  osteophyte complex. Flattening of the ventral thecal sac with no more than mild spinal stenosis. Moderate left with mild to moderate right C7 foraminal narrowing. C7-T1: Negative interspace. Mild  left greater than right facet hypertrophy. No stenosis. IMPRESSION: 1. Normal MRI appearance of the cervical spinal cord. 2. Multilevel cervical spondylosis with resultant mild diffuse spinal stenosis at C4-5 through C6-7. 3. Multifactorial degenerative changes with resultant multilevel foraminal narrowing as above. Notable findings include severe left with moderate right C6 foraminal stenosis, with moderate left and mild to moderate right C7 foraminal narrowing. Electronically Signed   By: Morene Hoard M.D.   On: 06/28/2024 21:23   MR BRAIN WO CONTRAST Result Date: 06/28/2024 CLINICAL DATA:  Initial evaluation for acute neuro deficit, stroke suspected. EXAM: MRI HEAD WITHOUT CONTRAST TECHNIQUE: Multiplanar, multiecho pulse sequences of the brain and surrounding structures were obtained without intravenous contrast. COMPARISON:  CT from earlier the same day. FINDINGS: Brain: Examination degraded by motion. Generalized age-related cerebral atrophy. Patchy T2/FLAIR hyperintensity about the periventricular and deep white matter as well as the pons, consistent with chronic small vessel ischemic disease, moderate in nature. Encephalomalacia and gliosis involving the subcortical right frontal lobe, consistent with a chronic right ACA distribution infarct. Multiple small remote bilateral cerebellar infarcts, greater on the left. Small remote lacunar infarct noted within the left thalamus. No evidence for acute or subacute ischemia. Gray-white matter differentiation maintained. 1.2 cm acute intraparenchymal hemorrhage present at the subcortical left frontal lobe, corresponding with abnormality on prior CT, likely a small hemorrhagic contusion. Trace acute intraventricular hemorrhage seen layering within the occipital horns of both lateral ventricles. No other acute intracranial hemorrhage. Chronic hemosiderin staining noted at the parafalcine right frontal lobe related to the adjacent right ACA infarct. No other  significant chronic intracranial blood products. No mass lesion or midline shift. Ventricular prominence related global parenchymal volume loss of hydrocephalus. No extra-axial fluid collection. Pituitary gland within normal limits. Vascular: Major intracranial vascular flow voids are grossly maintained at the skull base. Skull and upper cervical spine: Cranial junction within normal limits. Bone marrow signal intensity normal. Soft tissue contusion present at the left forehead/periorbital region. Sinuses/Orbits: Acute left orbital floor fracture with scattered blood products within the left maxillary sinus, better evaluated on prior maxillofacial CT. No mastoid effusion. Other: None. IMPRESSION: 1. 1.2 cm acute intraparenchymal hemorrhage at the subcortical left frontal lobe, corresponding with abnormality on prior CT, likely a small hemorrhagic contusion. 2. Trace acute intraventricular hemorrhage layering within the occipital horns of both lateral ventricles. No hydrocephalus. 3. No other acute intracranial abnormality. 4. Acute left orbital floor fracture, better evaluated on prior maxillofacial CT. Prominent overlying left periorbital soft tissue contusion. 5. Underlying age-related cerebral atrophy with moderate chronic small vessel ischemic disease, with multiple remote ischemic infarcts as above. Electronically Signed   By: Morene Hoard M.D.   On: 06/28/2024 21:03   CT HEAD WO CONTRAST Result Date: 06/28/2024 CLINICAL DATA:  Syncopal event with head injury and loss of consciousness. Patient on Coumadin . Facial trauma. Nausea and vomiting. EXAM: CT HEAD WITHOUT CONTRAST CT MAXILLOFACIAL WITHOUT CONTRAST CT CERVICAL SPINE WITHOUT CONTRAST TECHNIQUE: Multidetector CT imaging of the head, cervical spine, and maxillofacial structures were performed using the standard protocol without intravenous contrast. Multiplanar CT image reconstructions of the cervical spine and maxillofacial structures were also  generated. RADIATION DOSE REDUCTION: This exam was performed according to the departmental dose-optimization program which includes automated exposure control, adjustment of the mA and/or kV according to patient size and/or  use of iterative reconstruction technique. COMPARISON:  01/19/2024 FINDINGS: CT HEAD FINDINGS Brain: Ventricles, cisterns and other CSF spaces are normal. There is chronic ischemic microvascular disease. Stable mild encephalomalacia over the right frontal lobe. 7 mm focus of intraparenchymal acute hemorrhage over the left frontal white matter. Very minimal acute subarachnoid hemorrhage over the adjacent right frontal lobe. No epidural subdural hemorrhage. No significant mass effect or midline shift. Traumatic Brain Injury Risk Stratification Skull Fracture: No - Low/mBIG 1 Subdural Hematoma (SDH): No - Low Subarachnoid Hemorrhage [facility]): Small single focus of acute subarachnoid hemorrhage over the right frontal region adjacent the midline. Epidural Hematoma (EDH): No - Low/mBIG 1 Cerebral contusion, intra-axial, intraparenchymal Hemorrhage (IPH): Single 7 mm focus of acute intraparenchymal hemorrhage over the left frontal white matter. Intraventricular Hemorrhage (IVH): No - Low/mBIG 1 Midline Shift > 1mm or Edema/effacement of sulci/vents: No - Low/mBIG 1 ---------------------------------------------------- Vascular: No hyperdense vessel or unexpected calcification. Skull: Normal. Negative for fracture or focal lesion. Other: Minimally displaced nasal bone fracture as well as fracture of the junction of the left nasal bone to the frontal process of the maxilla. Opacification over the left maxillary and ethmoid air cells. Minimal opacification over the left sphenoid sinus. Left frontal scalp contusion. CT MAXILLOFACIAL FINDINGS Osseous: Examination demonstrates a depressed comminuted left orbital floor fracture with partial entrapment of inferior rectus. There is also fracture of the medial wall  the left orbit without entrapment of intraorbital contents. There is a minimally displaced nasal bone fracture. Subtle fracture of the junction of the left nasal bone to the frontal process of the left axilla. Orbits: Fractures of the medial wall and floor of the left orbit as described above. Left periorbital soft tissue swelling. Globes are normal and symmetric. Retrobulbar spaces are otherwise unremarkable. Sinuses: Hypoplastic frontal sinuses. Hemorrhagic debris over the and left ethmoid and maxillary sinuses. Mastoid air cells are clear. Soft tissues: Soft tissue swelling adjacent the left mandible as well as over the left nasal bridge and left periorbital region. CT CERVICAL SPINE FINDINGS Alignment: Normal. Skull base and vertebrae: Vertebral body heights are maintained. There is mild spondylosis throughout the cervical spine to include uncovertebral joint spurring and facet arthropathy. No acute fracture. Mild bilateral neural foraminal narrowing at the C5-6 level and C6-7 levels. Soft tissues and spinal canal: No prevertebral fluid or swelling. No visible canal hematoma. Disc levels: Mild disc space narrowing at the C5-6 and C6-7 levels. Central disc protrusion at the C3-4 level with more pronounced central disc protrusion at the C4-5 level. Upper chest: No acute findings. Other: Schmorl's node over the superior endplate of T2 unchanged. IMPRESSION: 1. 7 mm focus of acute intraparenchymal hemorrhage over the left frontal white matter. Very minimal acute subarachnoid hemorrhage over the adjacent right frontal lobe. Findings compatible with minor TBI BIG-2 categorization. 2. Chronic ischemic microvascular disease with stable mild encephalomalacia over the right frontal lobe. 3. Depressed comminuted left orbital floor fracture with partial entrapment of inferior rectus muscle. Fracture of the medial wall the left orbit without entrapment of intraorbital contents. 4. Minimally displaced nasal bone fracture as  well as fracture of the junction of the left nasal bone to the frontal process of the left maxilla. 5. No acute cervical spine injury. 6. Mild spondylosis throughout the cervical spine with mild disc disease at the C5-6 and C6-7 levels. Central disc protrusion at the C3-4 level with more pronounced central disc protrusion at the C4-5 level as these findings would be better evaluated with MRI. Critical Value/emergent  results were called by telephone at the time of interpretation on 06/28/2024 at 1:28 pm to provider ANDREW TEE , who verbally acknowledged these results. Electronically Signed   By: Toribio Agreste M.D.   On: 06/28/2024 13:29   CT MAXILLOFACIAL WO CONTRAST Result Date: 06/28/2024 CLINICAL DATA:  Syncopal event with head injury and loss of consciousness. Patient on Coumadin . Facial trauma. Nausea and vomiting. EXAM: CT HEAD WITHOUT CONTRAST CT MAXILLOFACIAL WITHOUT CONTRAST CT CERVICAL SPINE WITHOUT CONTRAST TECHNIQUE: Multidetector CT imaging of the head, cervical spine, and maxillofacial structures were performed using the standard protocol without intravenous contrast. Multiplanar CT image reconstructions of the cervical spine and maxillofacial structures were also generated. RADIATION DOSE REDUCTION: This exam was performed according to the departmental dose-optimization program which includes automated exposure control, adjustment of the mA and/or kV according to patient size and/or use of iterative reconstruction technique. COMPARISON:  01/19/2024 FINDINGS: CT HEAD FINDINGS Brain: Ventricles, cisterns and other CSF spaces are normal. There is chronic ischemic microvascular disease. Stable mild encephalomalacia over the right frontal lobe. 7 mm focus of intraparenchymal acute hemorrhage over the left frontal white matter. Very minimal acute subarachnoid hemorrhage over the adjacent right frontal lobe. No epidural subdural hemorrhage. No significant mass effect or midline shift. Traumatic Brain Injury  Risk Stratification Skull Fracture: No - Low/mBIG 1 Subdural Hematoma (SDH): No - Low Subarachnoid Hemorrhage [facility]): Small single focus of acute subarachnoid hemorrhage over the right frontal region adjacent the midline. Epidural Hematoma (EDH): No - Low/mBIG 1 Cerebral contusion, intra-axial, intraparenchymal Hemorrhage (IPH): Single 7 mm focus of acute intraparenchymal hemorrhage over the left frontal white matter. Intraventricular Hemorrhage (IVH): No - Low/mBIG 1 Midline Shift > 1mm or Edema/effacement of sulci/vents: No - Low/mBIG 1 ---------------------------------------------------- Vascular: No hyperdense vessel or unexpected calcification. Skull: Normal. Negative for fracture or focal lesion. Other: Minimally displaced nasal bone fracture as well as fracture of the junction of the left nasal bone to the frontal process of the maxilla. Opacification over the left maxillary and ethmoid air cells. Minimal opacification over the left sphenoid sinus. Left frontal scalp contusion. CT MAXILLOFACIAL FINDINGS Osseous: Examination demonstrates a depressed comminuted left orbital floor fracture with partial entrapment of inferior rectus. There is also fracture of the medial wall the left orbit without entrapment of intraorbital contents. There is a minimally displaced nasal bone fracture. Subtle fracture of the junction of the left nasal bone to the frontal process of the left axilla. Orbits: Fractures of the medial wall and floor of the left orbit as described above. Left periorbital soft tissue swelling. Globes are normal and symmetric. Retrobulbar spaces are otherwise unremarkable. Sinuses: Hypoplastic frontal sinuses. Hemorrhagic debris over the and left ethmoid and maxillary sinuses. Mastoid air cells are clear. Soft tissues: Soft tissue swelling adjacent the left mandible as well as over the left nasal bridge and left periorbital region. CT CERVICAL SPINE FINDINGS Alignment: Normal. Skull base and vertebrae:  Vertebral body heights are maintained. There is mild spondylosis throughout the cervical spine to include uncovertebral joint spurring and facet arthropathy. No acute fracture. Mild bilateral neural foraminal narrowing at the C5-6 level and C6-7 levels. Soft tissues and spinal canal: No prevertebral fluid or swelling. No visible canal hematoma. Disc levels: Mild disc space narrowing at the C5-6 and C6-7 levels. Central disc protrusion at the C3-4 level with more pronounced central disc protrusion at the C4-5 level. Upper chest: No acute findings. Other: Schmorl's node over the superior endplate of T2 unchanged. IMPRESSION: 1. 7 mm focus  of acute intraparenchymal hemorrhage over the left frontal white matter. Very minimal acute subarachnoid hemorrhage over the adjacent right frontal lobe. Findings compatible with minor TBI BIG-2 categorization. 2. Chronic ischemic microvascular disease with stable mild encephalomalacia over the right frontal lobe. 3. Depressed comminuted left orbital floor fracture with partial entrapment of inferior rectus muscle. Fracture of the medial wall the left orbit without entrapment of intraorbital contents. 4. Minimally displaced nasal bone fracture as well as fracture of the junction of the left nasal bone to the frontal process of the left maxilla. 5. No acute cervical spine injury. 6. Mild spondylosis throughout the cervical spine with mild disc disease at the C5-6 and C6-7 levels. Central disc protrusion at the C3-4 level with more pronounced central disc protrusion at the C4-5 level as these findings would be better evaluated with MRI. Critical Value/emergent results were called by telephone at the time of interpretation on 06/28/2024 at 1:28 pm to provider ANDREW TEE , who verbally acknowledged these results. Electronically Signed   By: Toribio Agreste M.D.   On: 06/28/2024 13:29   CT CERVICAL SPINE WO CONTRAST Result Date: 06/28/2024 CLINICAL DATA:  Syncopal event with head injury  and loss of consciousness. Patient on Coumadin . Facial trauma. Nausea and vomiting. EXAM: CT HEAD WITHOUT CONTRAST CT MAXILLOFACIAL WITHOUT CONTRAST CT CERVICAL SPINE WITHOUT CONTRAST TECHNIQUE: Multidetector CT imaging of the head, cervical spine, and maxillofacial structures were performed using the standard protocol without intravenous contrast. Multiplanar CT image reconstructions of the cervical spine and maxillofacial structures were also generated. RADIATION DOSE REDUCTION: This exam was performed according to the departmental dose-optimization program which includes automated exposure control, adjustment of the mA and/or kV according to patient size and/or use of iterative reconstruction technique. COMPARISON:  01/19/2024 FINDINGS: CT HEAD FINDINGS Brain: Ventricles, cisterns and other CSF spaces are normal. There is chronic ischemic microvascular disease. Stable mild encephalomalacia over the right frontal lobe. 7 mm focus of intraparenchymal acute hemorrhage over the left frontal white matter. Very minimal acute subarachnoid hemorrhage over the adjacent right frontal lobe. No epidural subdural hemorrhage. No significant mass effect or midline shift. Traumatic Brain Injury Risk Stratification Skull Fracture: No - Low/mBIG 1 Subdural Hematoma (SDH): No - Low Subarachnoid Hemorrhage [facility]): Small single focus of acute subarachnoid hemorrhage over the right frontal region adjacent the midline. Epidural Hematoma (EDH): No - Low/mBIG 1 Cerebral contusion, intra-axial, intraparenchymal Hemorrhage (IPH): Single 7 mm focus of acute intraparenchymal hemorrhage over the left frontal white matter. Intraventricular Hemorrhage (IVH): No - Low/mBIG 1 Midline Shift > 1mm or Edema/effacement of sulci/vents: No - Low/mBIG 1 ---------------------------------------------------- Vascular: No hyperdense vessel or unexpected calcification. Skull: Normal. Negative for fracture or focal lesion. Other: Minimally displaced nasal bone  fracture as well as fracture of the junction of the left nasal bone to the frontal process of the maxilla. Opacification over the left maxillary and ethmoid air cells. Minimal opacification over the left sphenoid sinus. Left frontal scalp contusion. CT MAXILLOFACIAL FINDINGS Osseous: Examination demonstrates a depressed comminuted left orbital floor fracture with partial entrapment of inferior rectus. There is also fracture of the medial wall the left orbit without entrapment of intraorbital contents. There is a minimally displaced nasal bone fracture. Subtle fracture of the junction of the left nasal bone to the frontal process of the left axilla. Orbits: Fractures of the medial wall and floor of the left orbit as described above. Left periorbital soft tissue swelling. Globes are normal and symmetric. Retrobulbar spaces are otherwise unremarkable. Sinuses: Hypoplastic  frontal sinuses. Hemorrhagic debris over the and left ethmoid and maxillary sinuses. Mastoid air cells are clear. Soft tissues: Soft tissue swelling adjacent the left mandible as well as over the left nasal bridge and left periorbital region. CT CERVICAL SPINE FINDINGS Alignment: Normal. Skull base and vertebrae: Vertebral body heights are maintained. There is mild spondylosis throughout the cervical spine to include uncovertebral joint spurring and facet arthropathy. No acute fracture. Mild bilateral neural foraminal narrowing at the C5-6 level and C6-7 levels. Soft tissues and spinal canal: No prevertebral fluid or swelling. No visible canal hematoma. Disc levels: Mild disc space narrowing at the C5-6 and C6-7 levels. Central disc protrusion at the C3-4 level with more pronounced central disc protrusion at the C4-5 level. Upper chest: No acute findings. Other: Schmorl's node over the superior endplate of T2 unchanged. IMPRESSION: 1. 7 mm focus of acute intraparenchymal hemorrhage over the left frontal white matter. Very minimal acute subarachnoid  hemorrhage over the adjacent right frontal lobe. Findings compatible with minor TBI BIG-2 categorization. 2. Chronic ischemic microvascular disease with stable mild encephalomalacia over the right frontal lobe. 3. Depressed comminuted left orbital floor fracture with partial entrapment of inferior rectus muscle. Fracture of the medial wall the left orbit without entrapment of intraorbital contents. 4. Minimally displaced nasal bone fracture as well as fracture of the junction of the left nasal bone to the frontal process of the left maxilla. 5. No acute cervical spine injury. 6. Mild spondylosis throughout the cervical spine with mild disc disease at the C5-6 and C6-7 levels. Central disc protrusion at the C3-4 level with more pronounced central disc protrusion at the C4-5 level as these findings would be better evaluated with MRI. Critical Value/emergent results were called by telephone at the time of interpretation on 06/28/2024 at 1:28 pm to provider ANDREW TEE , who verbally acknowledged these results. Electronically Signed   By: Toribio Agreste M.D.   On: 06/28/2024 13:29   DG Chest Port 1 View Result Date: 06/28/2024 EXAM: 1 VIEW(S) XRAY OF THE CHEST 06/28/2024 12:31:00 PM COMPARISON: 05/30/2024 CLINICAL HISTORY: trauma; fall on thinners FINDINGS: LUNGS AND PLEURA: Diffuse interstitial prominence. Bibasilar patchy opacities. No pulmonary edema. No pleural effusion. No pneumothorax. HEART AND MEDIASTINUM: Similar enlarged cardiomediastinal silhouette status post aortic and mitral valve prostheses. BONES AND SOFT TISSUES: Right axillary surgical clips. Nondisplaced median sternotomy wires. No acute osseous abnormality. IMPRESSION: 1. Diffuse interstitial prominence with bibasilar patchy opacities, which may reflect edema or infection; correlate clinically. 2. Stable enlarged cardiomediastinal silhouette with aortic and mitral valve prostheses. Electronically signed by: Waddell Calk MD 06/28/2024 01:06 PM EDT RP  Workstation: [id number]    Microbiology: Results for orders placed or performed during the hospital encounter of 07/21/24  Resp panel by RT-PCR (RSV, Flu A&B, Covid) Anterior Nasal Swab     Status: None   Collection Time: 07/21/24  4:58 PM   Specimen: Anterior Nasal Swab  Result Value Ref Range Status   SARS Coronavirus 2 by RT PCR NEGATIVE NEGATIVE Final    Comment: (NOTE) SARS-CoV-2 target nucleic acids are NOT DETECTED.  The SARS-CoV-2 RNA is generally detectable in upper respiratory specimens during the acute phase of infection. The lowest concentration of SARS-CoV-2 viral copies this assay can detect is 138 copies/mL. A negative result does not preclude SARS-Cov-2 infection and should not be used as the sole basis for treatment or other patient management decisions. A negative result may occur with  improper specimen collection/handling, submission of specimen other than nasopharyngeal swab,  presence of viral mutation(s) within the areas targeted by this assay, and inadequate number of viral copies(<138 copies/mL). A negative result must be combined with clinical observations, patient history, and epidemiological information. The expected result is Negative.  Fact Sheet for Patients:  BloggerCourse.com  Fact Sheet for Healthcare Providers:  SeriousBroker.it  This test is no t yet approved or cleared by the [place]  FDA and  has been authorized for detection and/or diagnosis of SARS-CoV-2 by FDA under an Emergency Use Authorization (EUA). This EUA will remain  in effect (meaning this test can be used) for the duration of the COVID-19 declaration under Section 564(b)(1) of the Act, 21 U.S.C.section 360bbb-3(b)(1), unless the authorization is terminated  or revoked sooner.       Influenza A by PCR NEGATIVE NEGATIVE Final   Influenza B by PCR NEGATIVE NEGATIVE Final    Comment: (NOTE) The Xpert Xpress  SARS-CoV-2/FLU/RSV plus assay is intended as an aid in the diagnosis of influenza from Nasopharyngeal swab specimens and should not be used as a sole basis for treatment. Nasal washings and aspirates are unacceptable for Xpert Xpress SARS-CoV-2/FLU/RSV testing.  Fact Sheet for Patients: BloggerCourse.com  Fact Sheet for Healthcare Providers: SeriousBroker.it  This test is not yet approved or cleared by the [place]  FDA and has been authorized for detection and/or diagnosis of SARS-CoV-2 by FDA under an Emergency Use Authorization (EUA). This EUA will remain in effect (meaning this test can be used) for the duration of the COVID-19 declaration under Section 564(b)(1) of the Act, 21 U.S.C. section 360bbb-3(b)(1), unless the authorization is terminated or revoked.     Resp Syncytial Virus by PCR NEGATIVE NEGATIVE Final    Comment: (NOTE) Fact Sheet for Patients: BloggerCourse.com  Fact Sheet for Healthcare Providers: SeriousBroker.it  This test is not yet approved or cleared by the [place]  FDA and has been authorized for detection and/or diagnosis of SARS-CoV-2 by FDA under an Emergency Use Authorization (EUA). This EUA will remain in effect (meaning this test can be used) for the duration of the COVID-19 declaration under Section 564(b)(1) of the Act, 21 U.S.C. section 360bbb-3(b)(1), unless the authorization is terminated or revoked.  Performed at [facility], [address] [place], [place] [address]   MRSA Next Gen by PCR, Nasal     Status: Abnormal   Collection Time: 07/21/24 10:00 PM   Specimen: Nasal Mucosa; Nasal Swab  Result Value Ref Range Status   MRSA by PCR Next Gen DETECTED (A) NOT DETECTED Final    Comment: CRITICAL RESULT CALLED TO, READ BACK BY AND VERIFIED WITH: O. PUCKETT AT 0920 ON 10.20.25 BY ADGER J         The GeneXpert MRSA Assay  (FDA approved for NASAL specimens only), is one component of a comprehensive MRSA colonization surveillance program. It is not intended to diagnose MRSA infection nor to guide or monitor treatment for MRSA infections. Performed at [facility], [address], [place], [place] [address]     Labs: CBC: Recent Labs  Lab 07/21/24 1702 07/22/24 0045  WBC 5.3 5.7  NEUTROABS 3.5  --   HGB 11.5* 11.4*  HCT 37.9 37.6  MCV 88.1 86.8  PLT 304 296   Basic Metabolic Panel: Recent Labs  Lab 07/21/24 1702 07/22/24 0045 07/23/24 0441 07/24/24 0438  NA 137 141 139 141  K 3.7 3.0* 3.4* 3.3*  CL 97* 99 99 101  CO2 28 28 28 29   GLUCOSE 113* 134* 94 104*  BUN 20  18 17 21   CREATININE 1.58* 1.50* 1.30* 1.56*  CALCIUM  8.9 8.6* 8.7* 8.4*  MG 1.6*  --   --   --   PHOS 3.6  --   --   --    Liver Function Tests: Recent Labs  Lab 07/21/24 1702 07/22/24 0045  AST 28 22  ALT 18 14  ALKPHOS 69 68  BILITOT 0.4 0.5  PROT 7.1 6.8  ALBUMIN  4.0 3.8   CBG: Recent Labs  Lab 07/21/24 2159  GLUCAP 102*    Discharge time spent: greater than 40 minutes.  Signed: Adriana DELENA Grams, MD [facility] 07/24/2024

## 2024-07-24 NOTE — Progress Notes (Signed)
 Mobility Specialist Progress Note:    07/24/24 0940  Orthostatic Lying   BP- Lying 125/48  Pulse- Lying 69  Orthostatic Sitting  BP- Sitting 114/48  Pulse- Sitting 72  Orthostatic Standing at 0 minutes  BP- Standing at 0 minutes 93/51  Pulse- Standing at 0 minutes 76  Orthostatic Standing at 3 minutes  BP- Standing at 3 minutes 108/53  Pulse- Standing at 3 minutes 76  Mobility  Activity Stood at bedside;Dangled on edge of bed  Level of Assistance Standby assist, set-up cues, supervision of patient - no hands on  Assistive Device Front wheel walker  Distance Ambulated (ft) 1 ft  Range of Motion/Exercises Active;All extremities  Activity Response Tolerated well  Mobility Referral Yes  Mobility visit 1 Mobility  Mobility Specialist Start Time (ACUTE ONLY) 0940  Mobility Specialist Stop Time (ACUTE ONLY) 0955  Mobility Specialist Time Calculation (min) (ACUTE ONLY) 15 min   Pt received in bed, agreeable to orthostatics. Required SBA to stand at bedside with RW. Tolerated well, denied dizziness. Returned supine, all needs met.  Takari Lundahl Mobility Specialist Please contact via Special educational needs teacher or  Rehab office at [phone]

## 2024-07-24 NOTE — Plan of Care (Signed)
  Problem: Education: Goal: Knowledge of General Education information will improve Description: Including pain rating scale, medication(s)/side effects and non-pharmacologic comfort measures Outcome: Adequate for Discharge   Problem: Health Behavior/Discharge Planning: Goal: Ability to manage health-related needs will improve Outcome: Adequate for Discharge   Problem: Clinical Measurements: Goal: Ability to maintain clinical measurements within normal limits will improve Outcome: Adequate for Discharge Goal: Will remain free from infection Outcome: Adequate for Discharge Goal: Diagnostic test results will improve Outcome: Adequate for Discharge Goal: Respiratory complications will improve Outcome: Adequate for Discharge Goal: Cardiovascular complication will be avoided Outcome: Adequate for Discharge   Problem: Activity: Goal: Risk for activity intolerance will decrease Outcome: Adequate for Discharge   Problem: Nutrition: Goal: Adequate nutrition will be maintained Outcome: Adequate for Discharge   Problem: Coping: Goal: Level of anxiety will decrease Outcome: Adequate for Discharge   Problem: Elimination: Goal: Will not experience complications related to bowel motility Outcome: Adequate for Discharge Goal: Will not experience complications related to urinary retention Outcome: Adequate for Discharge   Problem: Pain Managment: Goal: General experience of comfort will improve and/or be controlled Outcome: Adequate for Discharge   Problem: Safety: Goal: Ability to remain free from injury will improve Outcome: Adequate for Discharge   Problem: Skin Integrity: Goal: Risk for impaired skin integrity will decrease Outcome: Adequate for Discharge   Problem: Acute Rehab PT Goals(only PT should resolve) Goal: Pt Will Go Supine/Side To Sit Outcome: Adequate for Discharge Goal: Patient Will Transfer Sit To/From Stand Outcome: Adequate for Discharge Goal: Pt Will  Transfer Bed To Chair/Chair To Bed Outcome: Adequate for Discharge Goal: Pt Will Ambulate Outcome: Adequate for Discharge   Problem: Acute Rehab OT Goals (only OT should resolve) Goal: Pt. Will Perform Grooming Outcome: Adequate for Discharge Goal: Pt. Will Perform Lower Body Dressing Outcome: Adequate for Discharge Goal: Pt. Will Transfer To Toilet Outcome: Adequate for Discharge Goal: Pt. Will Perform Toileting-Clothing Manipulation Outcome: Adequate for Discharge

## 2024-07-25 DIAGNOSIS — R300 Dysuria: Secondary | ICD-10-CM | POA: Diagnosis not present

## 2024-07-25 DIAGNOSIS — D631 Anemia in chronic kidney disease: Secondary | ICD-10-CM | POA: Diagnosis not present

## 2024-07-25 DIAGNOSIS — R809 Proteinuria, unspecified: Secondary | ICD-10-CM | POA: Diagnosis not present

## 2024-07-25 DIAGNOSIS — R2681 Unsteadiness on feet: Secondary | ICD-10-CM | POA: Diagnosis not present

## 2024-07-25 DIAGNOSIS — E1143 Type 2 diabetes mellitus with diabetic autonomic (poly)neuropathy: Secondary | ICD-10-CM | POA: Diagnosis not present

## 2024-07-25 DIAGNOSIS — S022XXD Fracture of nasal bones, subsequent encounter for fracture with routine healing: Secondary | ICD-10-CM | POA: Diagnosis not present

## 2024-07-25 DIAGNOSIS — S02832D Fracture of medial orbital wall, left side, subsequent encounter for fracture with routine healing: Secondary | ICD-10-CM | POA: Diagnosis not present

## 2024-07-25 DIAGNOSIS — E1122 Type 2 diabetes mellitus with diabetic chronic kidney disease: Secondary | ICD-10-CM | POA: Diagnosis not present

## 2024-07-25 DIAGNOSIS — N390 Urinary tract infection, site not specified: Secondary | ICD-10-CM | POA: Diagnosis not present

## 2024-07-25 DIAGNOSIS — I5032 Chronic diastolic (congestive) heart failure: Secondary | ICD-10-CM | POA: Diagnosis not present

## 2024-07-25 DIAGNOSIS — Z79899 Other long term (current) drug therapy: Secondary | ICD-10-CM | POA: Diagnosis not present

## 2024-07-25 DIAGNOSIS — N1832 Chronic kidney disease, stage 3b: Secondary | ICD-10-CM | POA: Diagnosis not present

## 2024-07-25 DIAGNOSIS — I130 Hypertensive heart and chronic kidney disease with heart failure and stage 1 through stage 4 chronic kidney disease, or unspecified chronic kidney disease: Secondary | ICD-10-CM | POA: Diagnosis not present

## 2024-07-26 ENCOUNTER — Other Ambulatory Visit: Payer: Self-pay

## 2024-07-26 NOTE — Patient Instructions (Signed)
 Visit Information  Thank you for taking time to visit with me today. Please don't hesitate to contact me if I can be of assistance to you before our next scheduled telephone appointment.  Our next appointment is by telephone on 08/02/24 at 1200 pm  Following is a copy of your care plan:   Goals Addressed             This Visit's Progress    VBCI Transitions of Care (TOC) Care Plan       Problems:  Recent Hospitalization for treatment of Intraparenchymal hematoma of brain s/p syncopal episode and collapseIntraparenchymal hematoma of brain s/p syncopal episode and collapse Knowledge Deficit Related to Intraparenchymal hematoma of brain s/p syncopal episode and collapseIntraparenchymal hematoma of brain s/p syncopal episode and collapse Facial Lacerations and fracture  Goal:  Over the next 30 days, the patient will not experience hospital readmission  Interventions:  Transitions of Care: 07/26/24 Recent hospital observation visit for fever nausea, and diarrhea.  Spouse states patient is doing okay but weak. Medication changes.  Discussed possible reason for Zebeta  and Farxiga  being discontinued. But will discuss further on PCP visit next week.  Home health continues.  Palliative to call next week.  PCP visit 07-30-24. Cardiology in November.  In home caregiver continues 4hrs/day.  Reviewed falls prevention and assistive devices.    Doctor Visits  - discussed the importance of doctor visits Reviewed Signs and symptoms of infection    Patient Self Care Activities:  Attend all scheduled provider appointments Call pharmacy for medication refills 3-7 days in advance of running out of medications Call provider office for new concerns or questions  Notify RN Care Manager of TOC call rescheduling needs Participate in Transition of Care Program/Attend TOC scheduled calls Take medications as prescribed   Notify physician for any changes or concerns.   Monitor facial lacerations for  swelling, redness, pain, and pus Seek immediate medical attention for any loss of consciousness, headache that won't go away, vomiting, weakness, blurred vision, trouble staying steady.  Plan:  The patient has been provided with contact information for the care management team and has been advised to call with any health related questions or concerns.         Patient verbalizes understanding of instructions and care plan provided today and agrees to view in MyChart. Active MyChart status and patient understanding of how to access instructions and care plan via MyChart confirmed with patient.     The patient has been provided with contact information for the care management team and has been advised to call with any health related questions or concerns.   Please call the care guide team at [phone] if you need to cancel or reschedule your appointment.   Please call the Suicide and Crisis Lifeline: 988 if you are experiencing a Mental Health or Behavioral Health Crisis or need someone to talk to.  [name] J. Sara Detjen RN, MSN [facility], [facility] RN Care Manager Direct Dial: [phone]  Fax: [phone] Website: [email or web address]

## 2024-07-26 NOTE — Transitions of Care (Post Inpatient/ED Visit) (Signed)
 Transition of Care week 3  Visit Note  07/26/2024  Name: [name] MRN: [id number]          DOB: Nov 19, 1945  Situation: Patient enrolled in [facility] [facility] 30-day program. Visit completed with spouse Lamar by telephone.   Background:   Initial Transition Care Management Follow-up Telephone Call Discharge Date and Diagnosis: 07/10/24, Intraparenchymal hematoma of brain s/p syncopal episode and collapse   Past Medical History:  Diagnosis Date   Anemia    Asthma 2010   Atrial fibrillation (HCC)    Atrial flutter (HCC)    Breast carcinoma (HCC) 1995   1995   Cardioembolic stroke (HCC) 01/2012   Right frontal in 01/2012; normal carotid ultrasound; possible LAA thrombus by TEE; virtual complete neurologic recovery   Carotid artery stenosis 1961   Chronic kidney disease, stage 2, mildly decreased GFR    GFR of approximately 60   Diabetes mellitus without complication (HCC) 5+ years ago   Controlled most of time   Diverticulosis of colon (without mention of hemorrhage) 2012   Dr. Golda   Fasting hyperglycemia    120 fasting   Gastroesophageal reflux disease    Gastroparesis    Heart failure with improved ejection fraction (HFimpEF) (HCC) 5+ years   Heart murmur 1961   Hemorrhoids    History of aortic valve replacement with bioprosthetic valve    History of mitral valve replacement with bioprosthetic valve    Hyperlipidemia    Hypertension    pt denies 05/30/13     Dr Alvan chester   Hyponatremia    Hypothyroidism    Rheumatic heart disease    Shortness of breath    Syncope and collapse February, 2024    Assessment: Patient Reported Symptoms: Cognitive Cognitive Status: Alert and oriented to person, place, and time      Neurological Neurological Review of Symptoms: Weakness Neurological Management Strategies: Exercise Neurological Comment: Has walker and home health therapy  HEENT HEENT Symptoms Reported: Other: HEENT Management Strategies: Routine screening,  Medication therapy HEENT Comment: Patient with facial fracture from fall. Spouse reports bruising and lacerations improving. Spouse to reschedule with ENT for follow up as appointment was cancelled due to hospitalization.    Cardiovascular Cardiovascular Symptoms Reported: No symptoms reported    Respiratory Respiratory Symptoms Reported: No symptoms reported    Endocrine Endocrine Symptoms Reported: No symptoms reported    Gastrointestinal Gastrointestinal Symptoms Reported: No symptoms reported Gastrointestinal Comment: Recent observation stay for nausea, diarrhea and fever.  Per spouse appetite is good at home and now using protein supplement.    Genitourinary Genitourinary Symptoms Reported: Incontinence Genitourinary Management Strategies: Incontinence garment/pad Genitourinary Self-Management Outcome: 3 (uncertain)  Integumentary Integumentary Symptoms Reported: Wound Additional Integumentary Details: Facial healing continues Skin Management Strategies: Routine screening  Musculoskeletal Musculoskelatal Symptoms Reviewed: Unsteady gait, Weakness Additional Musculoskeletal Details: Using rollator per spouse. Discussed talking with PT for wheelchair recommendations. PT and OT active with patient per spouse. Musculoskeletal Management Strategies: Exercise, Routine screening      Psychosocial Psychosocial Symptoms Reported: Not assessed Additional Psychological Details: spouse completes assessment         There were no vitals filed for this visit.  Medications Reviewed Today     Reviewed by Cecely Rengel, RN (Case Manager) on 07/26/24 at 1156  Med List Status: <None>   Medication Order Taking? Sig Documenting Provider Last Dose Status Informant  acetaminophen  (TYLENOL ) 325 MG tablet [id number] Yes Take 2 tablets (650 mg total) by mouth every 6 (six) hours  as needed. [name], DO  Active Spouse/Significant Other, Pharmacy Records  albuterol  (VENTOLIN  HFA) 108 [phone] Base)  MCG/ACT inhaler [id number] Yes Inhale 2 puffs into the lungs every 6 (six) hours as needed for wheezing or shortness of breath. [provider]  Active Spouse/Significant Other, Pharmacy Records  amiodarone  (PACERONE ) 200 MG tablet [id number] Yes Take 1 tablet (200 mg total) by mouth daily.  Patient taking differently: Take 200 mg by mouth in the morning.   Mealor, [name], MD  Active Spouse/Significant Other, Pharmacy Records           Med Note WORLEY, ALASKA S   Thu Mar 14, 2024 10:51 AM)    apixaban (ELIQUIS) 5 MG TABS tablet [id number] Yes Take 1 tablet (5 mg total) by mouth 2 (two) times daily. [name], MD  Active   atorvastatin  (LIPITOR) 40 MG tablet [id number]  Take 1 tablet (40 mg total) by mouth daily. [name], MD  Active Spouse/Significant Other, Pharmacy Records  azelastine  (ASTELIN ) 0.1 % nasal spray [id number]  Place 1 spray into both nostrils 2 (two) times daily. Use in each nostril as directed  Patient taking differently: Place 1 spray into both nostrils daily as needed for allergies. Use in each nostril as directed   [name], NP  Active Spouse/Significant Other, Pharmacy Records  bisoprolol  (ZEBETA ) 5 MG tablet [id number]  Take 1 tablet (5 mg total) by mouth daily.  Patient taking differently: Take 5 mg by mouth in the morning.   Franchester, Cadence H, PA-C  Active Spouse/Significant Other, Pharmacy Records  escitalopram  (LEXAPRO ) 10 MG tablet [id number] Yes Take 10 mg by mouth daily with breakfast. [provider]  Active Spouse/Significant Other, Pharmacy Records           Med Note BLASE LONELL MARLA [place] Apr 30, 2020  5:38 PM)    feeding supplement (ENSURE PLUS HIGH PROTEIN) LIQD [id number] Yes Take 237 mLs by mouth 2 (two) times daily between meals. [name], MD  Active   levothyroxine  (SYNTHROID ) 100 MCG tablet [id number] Yes Take 100 mcg by mouth daily before breakfast. [provider]  Active Spouse/Significant Other,  Pharmacy Records  metFORMIN  (GLUCOPHAGE ) 500 MG tablet [id number] Yes Take 1 tablet (500 mg total) by mouth 2 (two) times daily with a meal. Gerome, Tillman CROME, PA-C  Active Spouse/Significant Other, Pharmacy Records  Multiple Vitamin (MULTIVITAMIN WITH MINERALS) TABS tablet [id number] Yes Take 1 tablet by mouth in the morning. [provider]  Active Spouse/Significant Other, Pharmacy Records  ondansetron  (ZOFRAN -ODT) 4 MG disintegrating tablet [id number] Yes Take 4 mg by mouth every 6 (six) hours as needed for nausea or vomiting. [provider]  Active Spouse/Significant Other, Pharmacy Records  oxyCODONE  (OXY IR/ROXICODONE ) 5 MG immediate release tablet [id number] Yes Take 1 tablet (5 mg total) by mouth every 6 (six) hours as needed for moderate pain (pain score 4-6). Trixie Nilda HERO, MD  Active Spouse/Significant Other, Pharmacy Records  pantoprazole  (PROTONIX ) 40 MG tablet [id number] Yes TAKE 1 TABLET 30 TO 60 MINUTES BEFORE FIRST MEAL OF THE DAY.  Patient taking differently: Take 40 mg by mouth daily before breakfast.   Darlean Ozell NOVAK, MD  Active Spouse/Significant Other, Pharmacy Records  Polyethyl Glycol-Propyl Glycol (SYSTANE OP) [id number] Yes Place 1 drop into both eyes daily as needed (dry eyes). [provider]  Active Spouse/Significant Other, Pharmacy Records  potassium chloride  SA (KLOR-CON  M) 20 MEQ tablet [id number] Yes Take 1 tablet (20 mEq total)  by mouth daily.  Patient taking differently: Take 20 mEq by mouth in the morning.   Davia Nydia POUR, MD  Active Spouse/Significant Other, Pharmacy Records  torsemide  (DEMADEX ) 20 MG tablet [id number] Yes Take 1 tablet (20 mg total) by mouth daily. [name], MD  Active   TRELEGY ELLIPTA  100-62.5-25 MCG/ACT AEPB [id number] Yes Inhale 1 puff into the lungs daily. [provider]  Active Spouse/Significant Other, Pharmacy Records            Goals Addressed             This Visit's Progress     VBCI Transitions of Care (TOC) Care Plan       Problems:  Recent Hospitalization for treatment of Intraparenchymal hematoma of brain s/p syncopal episode and collapseIntraparenchymal hematoma of brain s/p syncopal episode and collapse Knowledge Deficit Related to Intraparenchymal hematoma of brain s/p syncopal episode and collapseIntraparenchymal hematoma of brain s/p syncopal episode and collapse Facial Lacerations and fracture  Goal:  Over the next 30 days, the patient will not experience hospital readmission  Interventions:  Transitions of Care: 07/26/24 Recent hospital observation visit for fever nausea, and diarrhea.  Spouse states patient is doing okay but weak. Medication changes.  Discussed possible reason for Zebeta  and Farxiga  being discontinued. But will discuss further on PCP visit next week.  Home health continues.  Palliative to call next week.  PCP visit 07-30-24. Cardiology in November.  In home caregiver continues 4hrs/day.  Reviewed falls prevention and assistive devices.    Doctor Visits  - discussed the importance of doctor visits Reviewed Signs and symptoms of infection    Patient Self Care Activities:  Attend all scheduled provider appointments Call pharmacy for medication refills 3-7 days in advance of running out of medications Call provider office for new concerns or questions  Notify RN Care Manager of TOC call rescheduling needs Participate in Transition of Care Program/Attend TOC scheduled calls Take medications as prescribed   Notify physician for any changes or concerns.   Monitor facial lacerations for swelling, redness, pain, and pus Seek immediate medical attention for any loss of consciousness, headache that won't go away, vomiting, weakness, blurred vision, trouble staying steady.  Plan:  The patient has been provided with contact information for the care management team and has been advised to call with any health related questions or concerns.          Recommendation:   Continue Current Plan of Care  Follow Up Plan:   Telephone follow-up in 1 week  Rubye Strohmeyer J. Mikalyn Hermida RN, MSN [facility], [facility] RN Care Manager Direct Dial: [phone]  Fax: [phone] Website: [email or web address]

## 2024-07-29 ENCOUNTER — Encounter: Payer: Self-pay | Admitting: Cardiology

## 2024-07-30 DIAGNOSIS — S02832D Fracture of medial orbital wall, left side, subsequent encounter for fracture with routine healing: Secondary | ICD-10-CM | POA: Diagnosis not present

## 2024-07-30 DIAGNOSIS — N183 Chronic kidney disease, stage 3 unspecified: Secondary | ICD-10-CM | POA: Diagnosis not present

## 2024-07-30 DIAGNOSIS — E1143 Type 2 diabetes mellitus with diabetic autonomic (poly)neuropathy: Secondary | ICD-10-CM | POA: Diagnosis not present

## 2024-07-30 DIAGNOSIS — I130 Hypertensive heart and chronic kidney disease with heart failure and stage 1 through stage 4 chronic kidney disease, or unspecified chronic kidney disease: Secondary | ICD-10-CM | POA: Diagnosis not present

## 2024-07-30 DIAGNOSIS — N390 Urinary tract infection, site not specified: Secondary | ICD-10-CM | POA: Diagnosis not present

## 2024-07-30 DIAGNOSIS — D6869 Other thrombophilia: Secondary | ICD-10-CM | POA: Diagnosis not present

## 2024-07-30 DIAGNOSIS — I480 Paroxysmal atrial fibrillation: Secondary | ICD-10-CM | POA: Diagnosis not present

## 2024-07-30 DIAGNOSIS — I5032 Chronic diastolic (congestive) heart failure: Secondary | ICD-10-CM | POA: Diagnosis not present

## 2024-07-30 DIAGNOSIS — N1832 Chronic kidney disease, stage 3b: Secondary | ICD-10-CM | POA: Diagnosis not present

## 2024-07-30 DIAGNOSIS — E1122 Type 2 diabetes mellitus with diabetic chronic kidney disease: Secondary | ICD-10-CM | POA: Diagnosis not present

## 2024-07-30 DIAGNOSIS — D631 Anemia in chronic kidney disease: Secondary | ICD-10-CM | POA: Diagnosis not present

## 2024-07-30 DIAGNOSIS — S022XXD Fracture of nasal bones, subsequent encounter for fracture with routine healing: Secondary | ICD-10-CM | POA: Diagnosis not present

## 2024-07-30 DIAGNOSIS — E1129 Type 2 diabetes mellitus with other diabetic kidney complication: Secondary | ICD-10-CM | POA: Diagnosis not present

## 2024-08-01 DIAGNOSIS — I5032 Chronic diastolic (congestive) heart failure: Secondary | ICD-10-CM | POA: Diagnosis not present

## 2024-08-01 DIAGNOSIS — N1832 Chronic kidney disease, stage 3b: Secondary | ICD-10-CM | POA: Diagnosis not present

## 2024-08-01 DIAGNOSIS — S022XXD Fracture of nasal bones, subsequent encounter for fracture with routine healing: Secondary | ICD-10-CM | POA: Diagnosis not present

## 2024-08-01 DIAGNOSIS — S02832D Fracture of medial orbital wall, left side, subsequent encounter for fracture with routine healing: Secondary | ICD-10-CM | POA: Diagnosis not present

## 2024-08-01 DIAGNOSIS — E1143 Type 2 diabetes mellitus with diabetic autonomic (poly)neuropathy: Secondary | ICD-10-CM | POA: Diagnosis not present

## 2024-08-01 DIAGNOSIS — I130 Hypertensive heart and chronic kidney disease with heart failure and stage 1 through stage 4 chronic kidney disease, or unspecified chronic kidney disease: Secondary | ICD-10-CM | POA: Diagnosis not present

## 2024-08-01 DIAGNOSIS — E1122 Type 2 diabetes mellitus with diabetic chronic kidney disease: Secondary | ICD-10-CM | POA: Diagnosis not present

## 2024-08-01 DIAGNOSIS — D631 Anemia in chronic kidney disease: Secondary | ICD-10-CM | POA: Diagnosis not present

## 2024-08-01 DIAGNOSIS — N390 Urinary tract infection, site not specified: Secondary | ICD-10-CM | POA: Diagnosis not present

## 2024-08-02 ENCOUNTER — Other Ambulatory Visit: Payer: Self-pay

## 2024-08-02 DIAGNOSIS — N390 Urinary tract infection, site not specified: Secondary | ICD-10-CM | POA: Diagnosis not present

## 2024-08-02 NOTE — Transitions of Care (Post Inpatient/ED Visit) (Signed)
 Transition of Care week 4  Visit Note  08/02/2024  Name: [name] MRN: [id number]          DOB: 1945/11/23  Situation: Patient enrolled in [facility] 30-day program. Visit completed with spouse Lamar by telephone.   Background:   Initial Transition Care Management Follow-up Telephone Call Discharge Date and Diagnosis: 07/10/24, Intraparenchymal hematoma of brain s/p syncopal episode and collapse   Past Medical History:  Diagnosis Date   Anemia    Asthma 2010   Atrial fibrillation (HCC)    Atrial flutter (HCC)    Breast carcinoma (HCC) 1995   1995   Cardioembolic stroke (HCC) 01/2012   Right frontal in 01/2012; normal carotid ultrasound; possible LAA thrombus by TEE; virtual complete neurologic recovery   Carotid artery stenosis 1961   Chronic kidney disease, stage 2, mildly decreased GFR    GFR of approximately 60   Diabetes mellitus without complication (HCC) 5+ years ago   Controlled most of time   Diverticulosis of colon (without mention of hemorrhage) 2012   Dr. Golda   Fasting hyperglycemia    120 fasting   Gastroesophageal reflux disease    Gastroparesis    Heart failure with improved ejection fraction (HFimpEF) (HCC) 5+ years   Heart murmur 1961   Hemorrhoids    History of aortic valve replacement with bioprosthetic valve    History of mitral valve replacement with bioprosthetic valve    Hyperlipidemia    Hypertension    pt denies 05/30/13     Dr Alvan chester   Hyponatremia    Hypothyroidism    Rheumatic heart disease    Shortness of breath    Syncope and collapse February, 2024    Assessment: Patient Reported Symptoms: Cognitive Cognitive Status: Alert and oriented to person, place, and time, Normal speech and language skills      Neurological Neurological Review of Symptoms: Weakness Neurological Comment: Patient is more independent with using her walker  HEENT HEENT Symptoms Reported: Other:      Cardiovascular Cardiovascular Symptoms  Reported: No symptoms reported    Respiratory Respiratory Symptoms Reported: No symptoms reported    Endocrine Endocrine Symptoms Reported: No symptoms reported    Gastrointestinal Gastrointestinal Symptoms Reported: Change in appetite      Genitourinary Genitourinary Symptoms Reported: Incontinence Genitourinary Management Strategies: Incontinence garment/pad Genitourinary Self-Management Outcome: 4 (good)  Integumentary Integumentary Symptoms Reported: No symptoms reported Additional Integumentary Details: Spouse reports facial wounds healed now. Skin Management Strategies: Routine screening  Musculoskeletal Musculoskelatal Symptoms Reviewed: Difficulty walking, Unsteady gait, Weakness Additional Musculoskeletal Details: Spouse reports patient doing better with some unsteady gait at times.  Continues to use the rollator.  Continues to be active with home therapy Musculoskeletal Management Strategies: Routine screening, Exercise Musculoskeletal Self-Management Outcome: 4 (good)      Psychosocial Psychosocial Symptoms Reported: Not assessed Additional Psychological Details: spouse completes assessment         There were no vitals filed for this visit.  Medications Reviewed Today     Reviewed by Bowyn Mercier, RN (Case Manager) on 08/02/24 at 1201  Med List Status: <None>   Medication Order Taking? Sig Documenting Provider Last Dose Status Informant  acetaminophen  (TYLENOL ) 325 MG tablet [id number] Yes Take 2 tablets (650 mg total) by mouth every 6 (six) hours as needed. [name], DO  Active Spouse/Significant Other, Pharmacy Records  albuterol  (VENTOLIN  HFA) 108 [phone] Base) MCG/ACT inhaler [id number] Yes Inhale 2 puffs into the lungs every 6 (six) hours as  needed for wheezing or shortness of breath. [provider]  Active Spouse/Significant Other, Pharmacy Records  amiodarone  (PACERONE ) 200 MG tablet [id number] Yes Take 1 tablet (200 mg total) by mouth daily.  Patient  taking differently: Take 200 mg by mouth in the morning.   Mealor, [name], MD  Active Spouse/Significant Other, Pharmacy Records           Med Note WORLEY, [place]ASK[place] S   Thu Mar 14, 2024 10:51 AM)    apixaban (ELIQUIS) 5 MG TABS tablet [id number] Yes Take 1 tablet (5 mg total) by mouth 2 (two) times daily. [name], MD  Active   atorvastatin  (LIPITOR) 40 MG tablet [id number] Yes Take 1 tablet (40 mg total) by mouth daily. [name], MD  Active Spouse/Significant Other, Pharmacy Records  azelastine  (ASTELIN ) 0.1 % nasal spray [id number] Yes Place 1 spray into both nostrils 2 (two) times daily. Use in each nostril as directed  Patient taking differently: Place 1 spray into both nostrils daily as needed for allergies. Use in each nostril as directed   [name], NP  Active Spouse/Significant Other, Pharmacy Records  bisoprolol  (ZEBETA ) 5 MG tablet [id number] Yes Take 1 tablet (5 mg total) by mouth daily.  Patient taking differently: Take 5 mg by mouth in the morning.   Franchester, Cadence H, PA-C  Active Spouse/Significant Other, Pharmacy Records  escitalopram  (LEXAPRO ) 10 MG tablet [id number] Yes Take 10 mg by mouth daily with breakfast. [provider]  Active Spouse/Significant Other, Pharmacy Records           Med Note BLASE LONELL MARLA [place] Apr 30, 2020  5:38 PM)    feeding supplement (ENSURE PLUS HIGH PROTEIN) LIQD [id number] Yes Take 237 mLs by mouth 2 (two) times daily between meals. [name], MD  Active   levothyroxine  (SYNTHROID ) 100 MCG tablet [id number] Yes Take 100 mcg by mouth daily before breakfast. [provider]  Active Spouse/Significant Other, Pharmacy Records  metFORMIN  (GLUCOPHAGE ) 500 MG tablet [id number] Yes Take 1 tablet (500 mg total) by mouth 2 (two) times daily with a meal. Gerome, Tillman CROME, PA-C  Active Spouse/Significant Other, Pharmacy Records  Multiple Vitamin (MULTIVITAMIN WITH MINERALS) TABS tablet [id number] Yes Take 1 tablet by  mouth in the morning. [provider]  Active Spouse/Significant Other, Pharmacy Records  ondansetron  (ZOFRAN -ODT) 4 MG disintegrating tablet [id number] Yes Take 4 mg by mouth every 6 (six) hours as needed for nausea or vomiting. [provider]  Active Spouse/Significant Other, Pharmacy Records  oxyCODONE  (OXY IR/ROXICODONE ) 5 MG immediate release tablet [id number] Yes Take 1 tablet (5 mg total) by mouth every 6 (six) hours as needed for moderate pain (pain score 4-6). Gherghe, Costin M, MD  Active Spouse/Significant Other, Pharmacy Records  pantoprazole  (PROTONIX ) 40 MG tablet [id number] Yes TAKE 1 TABLET 30 TO 60 MINUTES BEFORE FIRST MEAL OF THE DAY.  Patient taking differently: Take 40 mg by mouth daily before breakfast.   Darlean Ozell NOVAK, MD  Active Spouse/Significant Other, Pharmacy Records  Polyethyl Glycol-Propyl Glycol (SYSTANE OP) [id number] Yes Place 1 drop into both eyes daily as needed (dry eyes). [provider]  Active Spouse/Significant Other, Pharmacy Records  potassium chloride  SA (KLOR-CON  M) 20 MEQ tablet [id number] Yes Take 1 tablet (20 mEq total) by mouth daily.  Patient taking differently: Take 20 mEq by mouth in the morning.   Davia Nydia MARLA, MD  Active Spouse/Significant Other, Pharmacy Records  torsemide  (DEMADEX ) 20 MG tablet [id number]  Yes Take 1 tablet (20 mg total) by mouth daily. [name], MD  Active   TRELEGY ELLIPTA  100-62.5-25 MCG/ACT AEPB [id number] Yes Inhale 1 puff into the lungs daily. [provider]  Active Spouse/Significant Other, Pharmacy Records            Goals Addressed             This Visit's Progress    VBCI Transitions of Care (TOC) Care Plan       Problems:  Recent Hospitalization for treatment of Intraparenchymal hematoma of brain s/p syncopal episode and collapseIntraparenchymal hematoma of brain s/p syncopal episode and collapse Knowledge Deficit Related to Intraparenchymal hematoma of brain s/p  syncopal episode and collapseIntraparenchymal hematoma of brain s/p syncopal episode and collapse Facial Lacerations and fracture  Goal:  Over the next 30 days, the patient will not experience hospital readmission  Interventions:  Transitions of Care: 08/02/24 Spoke with spouse who reports patient is recovering but slow. She is able to do more for herself.  She continues with Home health therapy and doing well with it per spouse.  PCP visit 07-30-24 went well and will see again in 2 weeks to assess need for farxiga . Cardiology in November.  In home caregiver continues 4hrs/day 5 days a week.  Reiterated falls prevention and assistive devices.  He states facial areas have healed.    Doctor Visits  - discussed the importance of doctor visits    Patient Self Care Activities:  Attend all scheduled provider appointments Call pharmacy for medication refills 3-7 days in advance of running out of medications Call provider office for new concerns or questions  Notify RN Care Manager of TOC call rescheduling needs Participate in Transition of Care Program/Attend TOC scheduled calls Take medications as prescribed   Notify physician for any changes or concerns.   Seek immediate medical attention for any loss of consciousness, headache that won't go away, vomiting, weakness, blurred vision, trouble staying steady.  Plan:  The patient has been provided with contact information for the care management team and has been advised to call with any health related questions or concerns.         Recommendation:   Continue Current Plan of Care  Follow Up Plan:   Telephone follow-up in 1 week  Shenice Dolder J. Joseline Mccampbell RN, MSN [facility], [facility] RN Care Manager Direct Dial: [phone]  Fax: [phone] Website: [email or web address]

## 2024-08-02 NOTE — Patient Instructions (Signed)
 Visit Information  Thank you for taking time to visit with me today. Please don't hesitate to contact me if I can be of assistance to you before our next scheduled telephone appointment.  Our next appointment is by telephone on 08/09/24 at 1200 pm  Following is a copy of your care plan:   Goals Addressed             This Visit's Progress    VBCI Transitions of Care (TOC) Care Plan       Problems:  Recent Hospitalization for treatment of Intraparenchymal hematoma of brain s/p syncopal episode and collapseIntraparenchymal hematoma of brain s/p syncopal episode and collapse Knowledge Deficit Related to Intraparenchymal hematoma of brain s/p syncopal episode and collapseIntraparenchymal hematoma of brain s/p syncopal episode and collapse Facial Lacerations and fracture  Goal:  Over the next 30 days, the patient will not experience hospital readmission  Interventions:  Transitions of Care: 08/02/24 Spoke with spouse who reports patient is recovering but slow. She is able to do more for herself.  She continues with Home health therapy and doing well with it per spouse.  PCP visit 07-30-24 went well and will see again in 2 weeks to assess need for farxiga . Cardiology in November.  In home caregiver continues 4hrs/day 5 days a week.  Reiterated falls prevention and assistive devices.  He states facial areas have healed.    Doctor Visits  - discussed the importance of doctor visits    Patient Self Care Activities:  Attend all scheduled provider appointments Call pharmacy for medication refills 3-7 days in advance of running out of medications Call provider office for new concerns or questions  Notify RN Care Manager of TOC call rescheduling needs Participate in Transition of Care Program/Attend TOC scheduled calls Take medications as prescribed   Notify physician for any changes or concerns.   Seek immediate medical attention for any loss of consciousness, headache that won't go away,  vomiting, weakness, blurred vision, trouble staying steady.  Plan:  The patient has been provided with contact information for the care management team and has been advised to call with any health related questions or concerns.         Patient verbalizes understanding of instructions and care plan provided today and agrees to view in MyChart. Active MyChart status and patient understanding of how to access instructions and care plan via MyChart confirmed with patient.     The patient has been provided with contact information for the care management team and has been advised to call with any health related questions or concerns.   Please call the care guide team at [phone] if you need to cancel or reschedule your appointment.   Please call the Suicide and Crisis Lifeline: 988 if you are experiencing a Mental Health or Behavioral Health Crisis or need someone to talk to.  Aranza Geddes J. Tykia Mellone RN, MSN [facility], [facility] RN Care Manager Direct Dial: [phone]  Fax: [phone] Website: [email or web address]

## 2024-08-04 NOTE — Progress Notes (Unsigned)
 Cardiology Office Note:  .   Date:  08/04/2024  ID:  [name], DOB 04-30-1946, MRN [id number] PCP: Sheryle Carwin, MD  [facility] Providers Cardiologist:  Alvan Carrier, MD Electrophysiologist:  Eulas FORBES Furbish, MD {  History of Present Illness: .   [name] is a 78 y.o. female w/PMHx of  rheumatic valve disease (MS/AI/AS ) status post pericardial MVR & AVR 2014,  CHFimpEF,  persistent atrial fibrillation/flutter,  stroke in 2013 in the setting of left atrial appendage thrombus,  mild coronary artery disease, obstructive lung disease,  DM, HTN vasovagal syncope   Admitted with fall vs syncope resulting in intracranial hemorrhage, orbital fractures  Dr. Furbish had a long discussion with the patient's husband.  He reports the patient had another fall about 2 weeks ago.  She did not lose consciousness, she just became weak and fell backwards.  She does have unsteadiness after standing up and has to stabilize and regain her equilibrium after standing.  Fall/syncope stories with variable prodromes, symptoms Initial/early QEKs with prolonged QT chronically on amiodarone  > maintaining SR and continued >> EKG 9/28 noted QTc  Advised to stay off BB with lower home BPs and avoid nodal blocking agents Loop implanted 07/02/24 Rec to resume OAC when cleared to EP s/o on 07/02/34 Discharged 07/10/24 back on warfarin   ER visit 10/11 found with UTI  Admitted 07/21/24 w/reports of ~ 4 days of progressive weakness Spouse reported a syncopal event in the ER Notes report loop recorder was checked with no events noted, cardiology consulted > rec reduction in torsemide  Suspect vagal syncope INR was 3.7 on admit > ultimately changed from warfarin >> Eliquis Discharged 10/22  Today's visit is scheduled as a post hospital visit ROS:   *** any nodal blockers?? *** brady pause on? *** long 1st degree, Qtc is longish >> amio dose *** AF burden *** eliquis, dose,  bleeding, labs *** amio labs 10/20/ LFTs ok >> needs TSH    Device information MDT ILR implanted 07/02/24 (syncope)  Arrhythmia/AAD hx AFib goes back 2014 post/peri-op valve surgery Amiodarone  seems to go back ~ 2014 (AFib)   Studies Reviewed: [name]    EKG done today and reviewed by myself ***  07/21/24 personally reviewed SR 64bpm, 1st degree AVBlock QTc  DEVICE carelink transmission dated *** was reviewed by myself *** Battery is good ***  07/22/24: limited echo 1. Left ventricular ejection fraction, by estimation, is 60 to 65%. The  left ventricle has normal function. The left ventricle has no regional  wall motion abnormalities. Left ventricular diastolic function could not  be evaluated.   2. Right ventricular systolic function is normal. The right ventricular  size is normal. There is normal pulmonary artery systolic pressure.   3. The mitral valve has been repaired/replaced. No evidence of mitral  valve regurgitation. The mean mitral valve gradient is 5.0 mmHg. There is  a 25 mm Magna Ease bioprosthetic valve present in the mitral position.  Procedure Date: 07/18/2013.   4. The aortic valve has been repaired/replaced. Aortic valve  regurgitation is not visualized. No aortic stenosis is present. There is a  21 mm Magna Ease valve present in the aortic position. Procedure Date:  07/18/2013. Aortic valve mean gradient  measures 17.3 mmHg. Compared to March 2025 Echo, mean gradient across  aortic prosthesis has significantly increased from 8 mm Hg to 17.3 mm Hg  now.   5. The inferior vena cava is normal in size  with greater than 50%  respiratory variability, suggesting right atrial pressure of 3 mmHg.    TTE 06/29/24: LVEF 70 to 75%.  Mitral valve was replaced.  Mild mitral stenosis with Edwards bioprosthetic valve present.  Bioprosthetic aortic valve with no stenosis or regurgitation.  LVEF is 70 to 75%.   12/19/2023: EF 55%, Gr II diastolic dysfunction.   Mild to moderately dilated left atrium, normal right atrium.  Status post MVR and AVR with bioprosthesis.  Prosthetic valves appear to be functioning normally.   Risk Assessment/Calculations:    Physical Exam:   VS:  There were no vitals taken for this visit.   Wt Readings from Last 3 Encounters:  07/21/24 148 lb 2.4 oz (67.2 kg)  07/13/24 154 lb (69.9 kg)  06/28/24 150 lb (68 kg)    GEN: Well nourished, well developed in no acute distress NECK: No JVD; No carotid bruits CARDIAC: ***RRR, no murmurs, rubs, gallops RESPIRATORY:  *** CTA b/l without rales, wheezing or rhonchi  ABDOMEN: Soft, non-tender, non-distended EXTREMITIES: *** No edema; No deformity   ILR site: *** is stable, no thinning, fluctuation, tethering  ASSESSMENT AND PLAN: .    persistent AFib CHA2DS2Vasc is 6, on Eliquis, *** appropriately odsed Chronic amiodarone  *** % burden  Syncope/falls ILR ***  VHD Bioprosthetic AVR/MVR Stable function by recent echos C/w Dr. Drexel  Prolonged QT Conduction system disease with long 1st degree Av block Chronic amiodarone  Avoid nodal blockers/bradycardia ***   Secondary hypercoagulable state 2/2 AFib     {Are you ordering a CV Procedure (e.g. stress test, cath, DCCV, TEE, etc)?   Press F2        :[id number]}     Dispo: ***  Signed, [name], PA-C

## 2024-08-05 ENCOUNTER — Encounter: Payer: Self-pay | Admitting: Radiology

## 2024-08-06 ENCOUNTER — Ambulatory Visit: Attending: Physician Assistant | Admitting: Physician Assistant

## 2024-08-06 VITALS — BP 110/64 | HR 60 | Ht 62.5 in | Wt 152.0 lb

## 2024-08-06 DIAGNOSIS — D6869 Other thrombophilia: Secondary | ICD-10-CM | POA: Diagnosis not present

## 2024-08-06 DIAGNOSIS — R9431 Abnormal electrocardiogram [ECG] [EKG]: Secondary | ICD-10-CM

## 2024-08-06 DIAGNOSIS — I4819 Other persistent atrial fibrillation: Secondary | ICD-10-CM | POA: Diagnosis not present

## 2024-08-06 DIAGNOSIS — D631 Anemia in chronic kidney disease: Secondary | ICD-10-CM | POA: Diagnosis not present

## 2024-08-06 DIAGNOSIS — I130 Hypertensive heart and chronic kidney disease with heart failure and stage 1 through stage 4 chronic kidney disease, or unspecified chronic kidney disease: Secondary | ICD-10-CM | POA: Diagnosis not present

## 2024-08-06 DIAGNOSIS — S022XXD Fracture of nasal bones, subsequent encounter for fracture with routine healing: Secondary | ICD-10-CM | POA: Diagnosis not present

## 2024-08-06 DIAGNOSIS — I440 Atrioventricular block, first degree: Secondary | ICD-10-CM

## 2024-08-06 DIAGNOSIS — E1143 Type 2 diabetes mellitus with diabetic autonomic (poly)neuropathy: Secondary | ICD-10-CM | POA: Diagnosis not present

## 2024-08-06 DIAGNOSIS — E1122 Type 2 diabetes mellitus with diabetic chronic kidney disease: Secondary | ICD-10-CM | POA: Diagnosis not present

## 2024-08-06 DIAGNOSIS — Z952 Presence of prosthetic heart valve: Secondary | ICD-10-CM

## 2024-08-06 DIAGNOSIS — S02832D Fracture of medial orbital wall, left side, subsequent encounter for fracture with routine healing: Secondary | ICD-10-CM | POA: Diagnosis not present

## 2024-08-06 DIAGNOSIS — N390 Urinary tract infection, site not specified: Secondary | ICD-10-CM | POA: Diagnosis not present

## 2024-08-06 DIAGNOSIS — N1832 Chronic kidney disease, stage 3b: Secondary | ICD-10-CM | POA: Diagnosis not present

## 2024-08-06 DIAGNOSIS — I5032 Chronic diastolic (congestive) heart failure: Secondary | ICD-10-CM | POA: Diagnosis not present

## 2024-08-06 NOTE — Patient Instructions (Signed)
 Medication Instructions:   STOP TAKING  BISOPROLOL    *If you need a refill on your cardiac medications before your next appointment, please call your pharmacy*   Lab Work: NONE ORDERED  TODAY     If you have labs (blood work) drawn today and your tests are completely normal, you will receive your results only by: MyChart Message (if you have MyChart) OR A paper copy in the mail If you have any lab test that is abnormal or we need to change your treatment, we will call you to review the results.   Testing/Procedures: NONE ORDERED  TODAY     Follow-Up: At [facility], you and your health needs are our priority.  As part of our continuing mission to provide you with exceptional heart care, our providers are all part of one team.  This team includes your primary Cardiologist (physician) and Advanced Practice Providers or APPs (Physician Assistants and Nurse Practitioners) who all work together to provide you with the care you need, when you need it.  Your next appointment:  AS SCHEDULED   WITH DR [facility]      We recommend signing up for the patient portal called MyChart.  Sign up information is provided on this After Visit Summary.  MyChart is used to connect with patients for Virtual Visits (Telemedicine).  Patients are able to view lab/test results, encounter notes, upcoming appointments, etc.  Non-urgent messages can be sent to your provider as well.   To learn more about what you can do with MyChart, go to [email or web address].   Other Instructions

## 2024-08-09 ENCOUNTER — Other Ambulatory Visit: Payer: Self-pay

## 2024-08-09 NOTE — Patient Instructions (Signed)
 Visit Information  Thank you for taking time to visit with me today.     You have completed the 30 day TOC program. Please contact physician for questions, changes or concerns.     Following is a copy of your care plan:   Goals Addressed             This Visit's Progress    COMPLETED: VBCI Transitions of Care (TOC) Care Plan       Problems:  Recent Hospitalization for treatment of Intraparenchymal hematoma of brain s/p syncopal episode and collapseIntraparenchymal hematoma of brain s/p syncopal episode and collapse Knowledge Deficit Related to Intraparenchymal hematoma of brain s/p syncopal episode and collapseIntraparenchymal hematoma of brain s/p syncopal episode and collapse Facial Lacerations and fracture  Goal:  Over the next 30 days, the patient will not experience hospital readmission  Interventions:  Transitions of Care: 08/09/24 Spoke with spouse who reports patient is recovering but slow. She is able to do more for herself.  She continues with Home health therapy and doing well with it per spouse.  Cardiology visit this week with medication changes noted.  Blood pressure 120/60 range per spouse.  In home caregiver continues 4hrs/day 5 days a week.  Discussed falls prevention and assistive devices.  Patient has completed 30 day TOC program.  Closing to TOC.    Doctor Visits  - discussed the importance of doctor visits    Patient Self Care Activities:  Attend all scheduled provider appointments Call pharmacy for medication refills 3-7 days in advance of running out of medications Call provider office for new concerns or questions  Notify RN Care Manager of TOC call rescheduling needs Participate in Transition of Care Program/Attend TOC scheduled calls Take medications as prescribed   Notify physician for any changes or concerns.   Seek immediate medical attention for any loss of consciousness, headache that won't go away, vomiting, weakness, blurred vision, trouble  staying steady.  Plan:  The patient has been provided with contact information for the care management team and has been advised to call with any health related questions or concerns.         Patient verbalizes understanding of instructions and care plan provided today and agrees to view in MyChart. Active MyChart status and patient understanding of how to access instructions and care plan via MyChart confirmed with patient.     The patient has been provided with contact information for the care management team and has been advised to call with any health related questions or concerns.   Please call the care guide team at [phone] if you need to cancel or reschedule your appointment.   Please call the Suicide and Crisis Lifeline: 988 if you are experiencing a Mental Health or Behavioral Health Crisis or need someone to talk to.   Sumaiyah Markert J. Cutberto Winfree RN, MSN [facility], [facility] Health RN Care Manager Direct Dial: [phone]  Fax: [phone] Website: [email or web address]

## 2024-08-09 NOTE — Transitions of Care (Post Inpatient/ED Visit) (Signed)
 Transition of Care Week 5  Visit Note  08/09/2024  Name: [name] MRN: [id number]          DOB: Mar 14, 1946  Situation: Patient enrolled in [facility] 30-day program. Visit completed with spouse Lamar by telephone.   Background:   Initial Transition Care Management Follow-up Telephone Call Discharge Date and Diagnosis: 07/10/24, Intraparenchymal hematoma of brain s/p syncopal episode and collapse   Past Medical History:  Diagnosis Date   Anemia    Asthma 2010   Atrial fibrillation (HCC)    Atrial flutter (HCC)    Breast carcinoma (HCC) 1995   1995   Cardioembolic stroke (HCC) 01/2012   Right frontal in 01/2012; normal carotid ultrasound; possible LAA thrombus by TEE; virtual complete neurologic recovery   Carotid artery stenosis 1961   Chronic kidney disease, stage 2, mildly decreased GFR    GFR of approximately 60   Diabetes mellitus without complication (HCC) 5+ years ago   Controlled most of time   Diverticulosis of colon (without mention of hemorrhage) 2012   Dr. Golda   Fasting hyperglycemia    120 fasting   Gastroesophageal reflux disease    Gastroparesis    Heart failure with improved ejection fraction (HFimpEF) (HCC) 5+ years   Heart murmur 1961   Hemorrhoids    History of aortic valve replacement with bioprosthetic valve    History of mitral valve replacement with bioprosthetic valve    Hyperlipidemia    Hypertension    pt denies 05/30/13     Dr Alvan chester   Hyponatremia    Hypothyroidism    Rheumatic heart disease    Shortness of breath    Syncope and collapse February, 2024    Assessment: Patient Reported Symptoms: Cognitive Cognitive Status: Alert and oriented to person, place, and time      Neurological Neurological Review of Symptoms: Weakness Neurological Comment: Spouse reports patient doing more and using walker  HEENT HEENT Symptoms Reported: No symptoms reported HEENT Comment: ENT follow up 11/24    Cardiovascular Cardiovascular  Symptoms Reported: No symptoms reported    Respiratory Respiratory Symptoms Reported: No symptoms reported    Endocrine Endocrine Symptoms Reported: No symptoms reported    Gastrointestinal Gastrointestinal Symptoms Reported: No symptoms reported      Genitourinary Genitourinary Symptoms Reported: Incontinence Genitourinary Management Strategies: Incontinence garment/pad Genitourinary Self-Management Outcome: 4 (good)  Integumentary Integumentary Symptoms Reported: No symptoms reported    Musculoskeletal Musculoskelatal Symptoms Reviewed: Difficulty walking, Unsteady gait Additional Musculoskeletal Details: Spouse states patient continues with being able to do more for herself.  Some unsteadines at times.  Using rollator for ambulation.  PT and OT continue Musculoskeletal Management Strategies: Routine screening, Exercise Musculoskeletal Self-Management Outcome: 4 (good)      Psychosocial Psychosocial Symptoms Reported: Not assessed Additional Psychological Details: spouse completes assessment         There were no vitals filed for this visit.  Medications Reviewed Today     Reviewed by Cheney Gosch, RN (Case Manager) on 08/09/24 at 1222  Med List Status: <None>   Medication Order Taking? Sig Documenting Provider Last Dose Status Informant  acetaminophen  (TYLENOL ) 325 MG tablet [id number] Yes Take 2 tablets (650 mg total) by mouth every 6 (six) hours as needed. [name], DO  Active Spouse/Significant Other, Pharmacy Records  albuterol  (VENTOLIN  HFA) 108 [phone] Base) MCG/ACT inhaler [id number] Yes Inhale 2 puffs into the lungs every 6 (six) hours as needed for wheezing or shortness of breath. [provider]  Active Spouse/Significant Other, Pharmacy Records  amiodarone  (PACERONE ) 200 MG tablet [id number] Yes Take 1 tablet (200 mg total) by mouth daily.  Patient taking differently: Take 200 mg by mouth in the morning.   Mealor, [name], MD  Active Spouse/Significant  Other, Pharmacy Records           Med Note WORLEY, [place] S   Thu Mar 14, 2024 10:51 AM)    apixaban (ELIQUIS) 5 MG TABS tablet [id number] Yes Take 1 tablet (5 mg total) by mouth 2 (two) times daily. [name], MD  Active   atorvastatin  (LIPITOR) 40 MG tablet [id number] Yes Take 1 tablet (40 mg total) by mouth daily. [name], MD  Active Spouse/Significant Other, Pharmacy Records  azelastine  (ASTELIN ) 0.1 % nasal spray [id number] Yes Place 1 spray into both nostrils 2 (two) times daily. Use in each nostril as directed  Patient taking differently: Place 1 spray into both nostrils daily as needed for allergies. Use in each nostril as directed   [name], NP  Active Spouse/Significant Other, Pharmacy Records  escitalopram  (LEXAPRO ) 10 MG tablet [id number] Yes Take 10 mg by mouth daily with breakfast. [provider]  Active Spouse/Significant Other, Pharmacy Records           Med Note BLASE LONELL MARLA [place] Apr 30, 2020  5:38 PM)    feeding supplement (ENSURE PLUS HIGH PROTEIN) LIQD [id number] Yes Take 237 mLs by mouth 2 (two) times daily between meals. [name], MD  Active   levothyroxine  (SYNTHROID ) 100 MCG tablet [id number] Yes Take 100 mcg by mouth daily before breakfast. [provider]  Active Spouse/Significant Other, Pharmacy Records  metFORMIN  (GLUCOPHAGE ) 500 MG tablet [id number] Yes Take 1 tablet (500 mg total) by mouth 2 (two) times daily with a meal. Gerome, Tillman CROME, PA-C  Active Spouse/Significant Other, Pharmacy Records  Multiple Vitamin (MULTIVITAMIN WITH MINERALS) TABS tablet [id number] Yes Take 1 tablet by mouth in the morning. [provider]  Active Spouse/Significant Other, Pharmacy Records  ondansetron  (ZOFRAN -ODT) 4 MG disintegrating tablet [id number] Yes Take 4 mg by mouth every 6 (six) hours as needed for nausea or vomiting. [provider]  Active Spouse/Significant Other, Pharmacy Records  oxyCODONE  (OXY  IR/ROXICODONE ) 5 MG immediate release tablet [id number]  Take 1 tablet (5 mg total) by mouth every 6 (six) hours as needed for moderate pain (pain score 4-6). Trixie Nilda HERO, MD  Active Spouse/Significant Other, Pharmacy Records  pantoprazole  (PROTONIX ) 40 MG tablet [id number] Yes TAKE 1 TABLET 30 TO 60 MINUTES BEFORE FIRST MEAL OF THE DAY.  Patient taking differently: Take 40 mg by mouth daily before breakfast.   Darlean Ozell NOVAK, MD  Active Spouse/Significant Other, Pharmacy Records  Polyethyl Glycol-Propyl Glycol (SYSTANE OP) [id number] Yes Place 1 drop into both eyes daily as needed (dry eyes). [provider]  Active Spouse/Significant Other, Pharmacy Records  potassium chloride  SA (KLOR-CON  M) 20 MEQ tablet [id number] Yes Take 1 tablet (20 mEq total) by mouth daily.  Patient taking differently: Take 20 mEq by mouth in the morning.   Davia Nydia MARLA, MD  Active Spouse/Significant Other, Pharmacy Records  torsemide  (DEMADEX ) 20 MG tablet [id number] Yes Take 1 tablet (20 mg total) by mouth daily. [name], MD  Active   TRELEGY ELLIPTA  100-62.5-25 MCG/ACT AEPB [id number] Yes Inhale 1 puff into the lungs daily. [provider]  Active Spouse/Significant Other, Pharmacy Records  Goals Addressed             This Visit's Progress    COMPLETED: VBCI Transitions of Care (TOC) Care Plan       Problems:  Recent Hospitalization for treatment of Intraparenchymal hematoma of brain s/p syncopal episode and collapseIntraparenchymal hematoma of brain s/p syncopal episode and collapse Knowledge Deficit Related to Intraparenchymal hematoma of brain s/p syncopal episode and collapseIntraparenchymal hematoma of brain s/p syncopal episode and collapse Facial Lacerations and fracture  Goal:  Over the next 30 days, the patient will not experience hospital readmission  Interventions:  Transitions of Care: 08/09/24 Spoke with spouse who reports patient is recovering but  slow. She is able to do more for herself.  She continues with Home health therapy and doing well with it per spouse.  Cardiology visit this week with medication changes noted.  Blood pressure 120/60 range per spouse.  In home caregiver continues 4hrs/day 5 days a week.  Discussed falls prevention and assistive devices.  Patient has completed 30 day TOC program.  Closing to TOC.    Doctor Visits  - discussed the importance of doctor visits    Patient Self Care Activities:  Attend all scheduled provider appointments Call pharmacy for medication refills 3-7 days in advance of running out of medications Call provider office for new concerns or questions  Notify RN Care Manager of TOC call rescheduling needs Participate in Transition of Care Program/Attend TOC scheduled calls Take medications as prescribed   Notify physician for any changes or concerns.   Seek immediate medical attention for any loss of consciousness, headache that won't go away, vomiting, weakness, blurred vision, trouble staying steady.  Plan:  The patient has been provided with contact information for the care management team and has been advised to call with any health related questions or concerns.         Recommendation:   Continue Current Plan of Care Goals met: closing TOC program  Follow Up Plan:   Closing From:  Transitions of Care Program  Lilleigh Hechavarria DOROTHA Seeds RN, MSN [place]  [facility], [facility] Health RN Care Manager Direct Dial: [phone]  Fax: [phone] Website: [email or web address]

## 2024-08-12 ENCOUNTER — Ambulatory Visit (INDEPENDENT_AMBULATORY_CARE_PROVIDER_SITE_OTHER)

## 2024-08-12 DIAGNOSIS — I4819 Other persistent atrial fibrillation: Secondary | ICD-10-CM

## 2024-08-12 DIAGNOSIS — Z79899 Other long term (current) drug therapy: Secondary | ICD-10-CM | POA: Diagnosis not present

## 2024-08-12 DIAGNOSIS — I480 Paroxysmal atrial fibrillation: Secondary | ICD-10-CM | POA: Diagnosis not present

## 2024-08-12 DIAGNOSIS — N183 Chronic kidney disease, stage 3 unspecified: Secondary | ICD-10-CM | POA: Diagnosis not present

## 2024-08-12 DIAGNOSIS — I5032 Chronic diastolic (congestive) heart failure: Secondary | ICD-10-CM | POA: Diagnosis not present

## 2024-08-13 LAB — CUP PACEART REMOTE DEVICE CHECK
Date Time Interrogation Session: 20251109235028
Implantable Pulse Generator Implant Date: 20250930

## 2024-08-14 ENCOUNTER — Ambulatory Visit: Payer: Self-pay | Admitting: Cardiovascular Disease

## 2024-08-15 ENCOUNTER — Ambulatory Visit: Attending: Cardiology | Admitting: Cardiology

## 2024-08-15 ENCOUNTER — Encounter: Payer: Self-pay | Admitting: Cardiology

## 2024-08-15 VITALS — BP 144/64 | HR 77 | Ht 62.5 in | Wt 152.0 lb

## 2024-08-15 DIAGNOSIS — R55 Syncope and collapse: Secondary | ICD-10-CM

## 2024-08-15 MED ORDER — MECLIZINE HCL 25 MG PO TABS: 25.0000 mg | ORAL_TABLET | Freq: Four times a day (QID) | ORAL | 1 refills | Status: AC | PRN

## 2024-08-15 NOTE — Progress Notes (Unsigned)
 Clinical Summary [name] is a 78 y.o.female seen today for follow up of the following medical problems.   1. Valvular heart disease   - 07/18/13 patient underwent MVR with 25 mm Edwards Magna-ease pericardial valve and also AVR with 21 mm Edwards Magna-ease pericardial valve    -03/2022 echo: LVEF 60%, indet diastolic, normal MVR and AVR. 12/2023 echo: LVEF 55%, grade II dd, mod RV dysfunction, normal MVR and AVR    07/2024 echo: LVEF 60-65%< no WMAs, normal RV, normal MVR and AVR     2.Chronic HFpEF  11/2022 echo: LVEF 55-60%, indet diastolic fxn, mod pulm HTN, normal MVR and AVR 12/2023 echo: LVEF 55%, grade II dd, mod RV dysfunction, normal MVR and AVR  - sigifnicant SOB/DOE since Christmas - significant increase DOE. Up until walking 1/3 mile comfortable, now DOE just walking to mail box - some chest tightness at times. Midchest, 5/10 in severity. Not positional. Can last few hours constant. - mild increase in LE edema, taking extra torsemide .  - increased cough, no wheezing. Nonproductive cough - home weights 167 lbs.  - palpitations at times with activity, not at reast.        12/31/23 ER visit with SOB - BNP 300s, CXR with cardiomegaly and pulm vascular congestion, mild edema - given IV lasix  with improved symptoms in ER, discharged - no recent edema - compliant with meds - home weights 159 lbs stable. Down from 167 last visit - takes torsemide  20mg  daily. Has had uptrend in Cr with most recent pcp labs, Cr up to 1.4   - some LE edema at home at her baseline - home weights 155 lbs.  - ongoing SOB/DOE.   - some recent SOB. Mild LE edema - compliant with meds. She is on oral torsemide  20mg  daily.      3. Afib/aflutter - hx of cardioembolic CVA, has been on longterm anticoag - DCCV 01/04/23 - from Dr Marko notes if recurent aflutter consider amiodarone . -  -discussed EKG with EP last visit, thought to be atypical flutter or atach.  - EKG today looks  more evidence suspecting atypical flutter - she reports ongoing palpitations, SOB with activities.      -started on amio EP  02/23/24 DCCV to SR  - after conversion bisoprolol  lowered from 15mg  to 5mg  due to low HRs - 6/12 ER visit with SOB, improved with IV lasix    EKG today shows remains in SR, first degree AV block - no recent palpitaitons - compliant with meds. No bleeding on coumadin      - some recent fluttering. Just a few seconds - changed from coumadin  to eliquis on recent admission     Other medical problems not addressed this visit   3. HTN - she is compliantw ith meds     4. Hyperlipidemia - recent labs with pcp, compliant with stin   06/2021 TC 185 TG 75 HDL 66 LDL 104 01/2022 TC 177 TG 133 HDL 58 LDL 96   5. Carotid stenosis - 07/2013 US  with mild bilateral stenosis 2018 mild disease - 01/2021 US  mild bilateral disease - 11/2022 mild bilateral disease        6. Obstructive lung disease 2014 PFTs moderate obstruction   - some cough, can be productive. Some wheezing at times.    7. Syncope -admit 11/2022, from notes intense shoulder pain which triggered syncope suggesting vasovagal etiology. Had low bp and HRs  - admit 06/2024 with fall, possible syncope -  ICH, orbital fractures -has loop recorder placed - admit again 07/24/24 with syncope, loop recorder was benign.  -Per husband, patient experience unresponsiveness while in ED with arms raised above head associated with shaking, eyes rolled back and facial glare.    - presented to ER with weakness, some trouble walking -   Past Medical History:  Diagnosis Date   Anemia    Asthma 2010   Atrial fibrillation (HCC)    Atrial flutter (HCC)    Breast carcinoma (HCC) 1995   1995   Cardioembolic stroke (HCC) 01/2012   Right frontal in 01/2012; normal carotid ultrasound; possible LAA thrombus by TEE; virtual complete neurologic recovery   Carotid artery stenosis 1961   Chronic kidney disease, stage 2,  mildly decreased GFR    GFR of approximately 60   Diabetes mellitus without complication (HCC) 5+ years ago   Controlled most of time   Diverticulosis of colon (without mention of hemorrhage) 2012   Dr. Golda   Fasting hyperglycemia    120 fasting   Gastroesophageal reflux disease    Gastroparesis    Heart failure with improved ejection fraction (HFimpEF) (HCC) 5+ years   Heart murmur 1961   Hemorrhoids    History of aortic valve replacement with bioprosthetic valve    History of mitral valve replacement with bioprosthetic valve    Hyperlipidemia    Hypertension    pt denies 05/30/13     Dr Alvan chester   Hyponatremia    Hypothyroidism    Rheumatic heart disease    Shortness of breath    Syncope and collapse February, 2024     No Known Allergies   Current Outpatient Medications  Medication Sig Dispense Refill   acetaminophen  (TYLENOL ) 325 MG tablet Take 2 tablets (650 mg total) by mouth every 6 (six) hours as needed. 36 tablet 0   albuterol  (VENTOLIN  HFA) 108 (90 Base) MCG/ACT inhaler Inhale 2 puffs into the lungs every 6 (six) hours as needed for wheezing or shortness of breath.     amiodarone  (PACERONE ) 200 MG tablet Take 1 tablet (200 mg total) by mouth daily. (Patient taking differently: Take 200 mg by mouth in the morning.) 30 tablet 6   apixaban (ELIQUIS) 5 MG TABS tablet Take 1 tablet (5 mg total) by mouth 2 (two) times daily. 60 tablet 1   atorvastatin  (LIPITOR) 40 MG tablet Take 1 tablet (40 mg total) by mouth daily. 90 tablet 3   azelastine  (ASTELIN ) 0.1 % nasal spray Place 1 spray into both nostrils 2 (two) times daily. Use in each nostril as directed (Patient taking differently: Place 1 spray into both nostrils daily as needed for allergies. Use in each nostril as directed) 30 mL 1   escitalopram  (LEXAPRO ) 10 MG tablet Take 10 mg by mouth daily with breakfast.     feeding supplement (ENSURE PLUS HIGH PROTEIN) LIQD Take 237 mLs by mouth 2 (two) times daily  between meals. 14220 mL 0   levothyroxine  (SYNTHROID ) 100 MCG tablet Take 100 mcg by mouth daily before breakfast.     metFORMIN  (GLUCOPHAGE ) 500 MG tablet Take 1 tablet (500 mg total) by mouth 2 (two) times daily with a meal. 60 tablet 1   Multiple Vitamin (MULTIVITAMIN WITH MINERALS) TABS tablet Take 1 tablet by mouth in the morning.     ondansetron  (ZOFRAN -ODT) 4 MG disintegrating tablet Take 4 mg by mouth every 6 (six) hours as needed for nausea or vomiting.     oxyCODONE  (OXY IR/ROXICODONE )  5 MG immediate release tablet Take 1 tablet (5 mg total) by mouth every 6 (six) hours as needed for moderate pain (pain score 4-6). 20 tablet 0   pantoprazole  (PROTONIX ) 40 MG tablet TAKE 1 TABLET 30 TO 60 MINUTES BEFORE FIRST MEAL OF THE DAY. (Patient taking differently: Take 40 mg by mouth daily before breakfast.) 90 tablet 2   Polyethyl Glycol-Propyl Glycol (SYSTANE OP) Place 1 drop into both eyes daily as needed (dry eyes).     potassium chloride  SA (KLOR-CON  M) 20 MEQ tablet Take 1 tablet (20 mEq total) by mouth daily. (Patient taking differently: Take 20 mEq by mouth in the morning.) 30 tablet 0   torsemide  (DEMADEX ) 20 MG tablet Take 1 tablet (20 mg total) by mouth daily. 30 tablet 0   TRELEGY ELLIPTA  100-62.5-25 MCG/ACT AEPB Inhale 1 puff into the lungs daily.     No current facility-administered medications for this visit.     Past Surgical History:  Procedure Laterality Date   AORTIC VALVE REPLACEMENT N/A 07/18/2013   Procedure: AORTIC VALVE REPLACEMENT (AVR);  Surgeon: Dorise MARLA Fellers, MD;  Location: [facility] OR;  Service: Open Heart Surgery;  Laterality: N/A;   BACK SURGERY     BREAST LUMPECTOMY Right 10/03/1993   CARDIAC CATHETERIZATION  5+ years   CARDIOVERSION N/A 07/26/2013   Procedure: CARDIOVERSION;  Surgeon: Vina LULLA Gull, MD;  Location: [facility] ENDOSCOPY;  Service: Cardiovascular;  Laterality: N/A;   CARDIOVERSION N/A 01/04/2023   Procedure: CARDIOVERSION;  Surgeon: Hobart Powell BRAVO, MD;   Location: [facility] ENDOSCOPY;  Service: Cardiovascular;  Laterality: N/A;   CARDIOVERSION N/A 02/23/2024   Procedure: CARDIOVERSION;  Surgeon: [name], MD;  Location: [facility] ORS;  Service: Endoscopy;  Laterality: N/A;   COLONOSCOPY  10/03/2010   Negative screening procedure   DILATION AND CURETTAGE OF UTERUS     ESOPHAGEAL MANOMETRY N/A 06/17/2013   Procedure: ESOPHAGEAL MANOMETRY (EM);  Surgeon: Alm JONELLE Gander, MD;  Location: [facility] ENDOSCOPY;  Service: Endoscopy;  Laterality: N/A;   INTRAOPERATIVE TRANSESOPHAGEAL ECHOCARDIOGRAM N/A 07/18/2013   Procedure: INTRAOPERATIVE TRANSESOPHAGEAL ECHOCARDIOGRAM;  Surgeon: Dorise MARLA Fellers, MD;  Location: [facility] OR;  Service: Open Heart Surgery;  Laterality: N/A;   LEFT AND RIGHT HEART CATHETERIZATION WITH CORONARY ANGIOGRAM N/A 07/10/2013   Procedure: LEFT AND RIGHT HEART CATHETERIZATION WITH CORONARY ANGIOGRAM;  Surgeon: Lonni JONETTA Cash, MD;  Location: [facility] CATH LAB;  Service: Cardiovascular;  Laterality: N/A;   LOOP RECORDER INSERTION N/A 07/02/2024   Procedure: LOOP RECORDER INSERTION;  Surgeon: Nancey Eulas BRAVO, MD;  Location: [facility] INVASIVE CV LAB;  Service: Cardiovascular;  Laterality: N/A;   LUMBAR LAMINECTOMY/DECOMPRESSION MICRODISCECTOMY Right 05/13/2014   Procedure: LUMBAR LAMINECTOMY/DECOMPRESSION MICRODISCECTOMY 1 LEVEL  lumbar four/five;  Surgeon: Darina MALVA Boehringer, MD;  Location: [facility] NEURO ORS;  Service: Neurosurgery;  Laterality: Right;   MITRAL VALVE REPLACEMENT N/A 07/18/2013   Procedure: MITRAL VALVE (MV) REPLACEMENT;  Surgeon: Dorise MARLA Fellers, MD;  Location: [facility] OR;  Service: Open Heart Surgery;  Laterality: N/A;   MITRAL VALVE SURGERY  10/03/1960   [facility], closed mitral valvulotomy by finger fracture   TEE WITHOUT CARDIOVERSION  01/24/2012   Procedure: TRANSESOPHAGEAL ECHOCARDIOGRAM (TEE);  Surgeon: Redell GORMAN Shallow, MD;  Location: [facility] ENDOSCOPY;  Service: Cardiovascular;  Laterality: N/A;   TEE WITHOUT CARDIOVERSION N/A 07/11/2013    Procedure: TRANSESOPHAGEAL ECHOCARDIOGRAM (TEE);  Surgeon: Aleene JINNY Passe, MD;  Location: [facility] ENDOSCOPY;  Service: Cardiovascular;  Laterality: N/A;   TEE WITHOUT CARDIOVERSION N/A 07/26/2013   Procedure: TRANSESOPHAGEAL ECHOCARDIOGRAM (  TEE);  Surgeon: Vina LULLA Gull, MD;  Location: [facility] ENDOSCOPY;  Service: Cardiovascular;  Laterality: N/A;   TUBAL LIGATION  10/04/1971     No Known Allergies    Family History  Problem Relation Age of Onset   Heart disease Brother 21       MI   Rheum arthritis Maternal Grandmother    Asthma Maternal Grandfather    Hypothyroidism Mother    Diabetes Sister    Cirrhosis Father      Social History Ms. Prophete reports that she has never smoked. She has never used smokeless tobacco. Ms. Authier reports no history of alcohol use.   Review of Systems CONSTITUTIONAL: No weight loss, fever, chills, weakness or fatigue.  HEENT: Eyes: No visual loss, blurred vision, double vision or yellow sclerae.No hearing loss, sneezing, congestion, runny nose or sore throat.  SKIN: No rash or itching.  CARDIOVASCULAR:  RESPIRATORY: No shortness of breath, cough or sputum.  GASTROINTESTINAL: No anorexia, nausea, vomiting or diarrhea. No abdominal pain or blood.  GENITOURINARY: No burning on urination, no polyuria NEUROLOGICAL: No headache, dizziness, syncope, paralysis, ataxia, numbness or tingling in the extremities. No change in bowel or bladder control.  MUSCULOSKELETAL: No muscle, back pain, joint pain or stiffness.  LYMPHATICS: No enlarged nodes. No history of splenectomy.  PSYCHIATRIC: No history of depression or anxiety.  ENDOCRINOLOGIC: No reports of sweating, cold or heat intolerance. No polyuria or polydipsia.  [name]   Physical Examination There were no vitals filed for this visit. There were no vitals filed for this visit.  Gen: resting comfortably, no acute distress HEENT: no scleral icterus, pupils equal round and reactive, no palptable cervical  adenopathy,  CV Resp: Clear to auscultation bilaterally GI: abdomen is soft, non-tender, non-distended, normal bowel sounds, no hepatosplenomegaly MSK: extremities are warm, no edema.  Skin: warm, no rash Neuro:  no focal deficits Psych: appropriate affect   Diagnostic Studies  07/2013 TTE Study Conclusions  - Study data: Technically difficult study. - Left ventricle: The cavity size was normal. Wall thickness   was normal. Systolic function was normal. The estimated   ejection fraction was in the range of 55% to 60%.   Indeterminate diastolic function. There is evidence of   very high left atrial pressures (E/e' 63) - Aortic valve: Trileaflet; mildly thickened leaflets.   Accurate evaluation of aortic stenosis limited in the   setting of severe mitral stenosis and decreased   ventricular loadin. Morphologically there does not appear   to be significant stenosis. By gradient (mean PG 21 mmHg)   the stenosis is moderate, by AVA VTI (0.8) stenosis is   severe. Moderate regurgitation, AR PHT is 439 cm/s. Valve   area: 0.78cm^2(VTI). Valve area: 0.78cm^2 (Vmax). - Mitral valve: The findings are consistent with moderate to   severe stenosis. (Mean PG 12 mmHg, MVA by PHT 1.3. Cannot   use MVA by VTI due to moderate AI. Valve area by pressure   half-time: 1.38cm^2. - Left atrium: The atrium was severely dilated. - Right ventricle: Systolic function was mildly reduced, RV   TAPSE is 1.4, TV systolic annular tissue velocity 10 cm/s. - Pulmonary arteries: Systolic pressure was moderately   increased. PA peak pressure: 58mm Hg (S) (PASP based on a   fairly faint spectral Doppler waveform)     07/2013 TEE Study Conclusions  - Left ventricle: Systolic function was moderately reduced.   The estimated ejection fraction was in the range of 35% to   40%. -  Aortic valve: There was mild to moderate stenosis. Mild to   moderate regurgitation. - Mitral valve: The findings are consistent  with moderate to   severe stenosis. Valve area by pressure half-time:   1.45cm^2. - Left atrium: There was spontaneous echo contrast   (smoke). - Atrial septum: No defect or patent foramen ovale was   identified. - Tricuspid valve: No evidence of vegetation. Transesophageal echocardiography.  2D and color Doppler. Height:  Height: 157.5cm. Height: 62in.  Weight:  Weight: 75kg. Weight: 165lb.  Body mass index:  BMI: 30.2kg/m^2. Body surface area:    BSA: 1.24m^2.  Blood pressure: 143/56.  Patient status:  Inpatient.  Location:  Endoscopy.      06/2018 echo Study Conclusions   - Left ventricle: The cavity size was normal. Wall thickness was   normal. Systolic function was normal. The estimated ejection   fraction was in the range of 60% to 65%. - Aortic valve: AV prosthesis is difficult to see Peak and mean   gradients through the valve are 18 and 10 mm Hg respectively. - Mitral valve: Peak and mean gradients through the valve   prosthesis are 19 and 4 mm Hg respectively MVA by P T1/2 is 3   cm2. s/p MV prosthesis. Valve prosthesis is difficult to see. - Pulmonary arteries: PA peak pressure: 48 mm Hg (S).     04/2020 echo IMPRESSIONS     1. Left ventricular ejection fraction, by estimation, is 60 to 65%. The  left ventricle has normal function. The left ventricle has no regional  wall motion abnormalities. Left ventricular diastolic parameters are  indeterminate.   2. Right ventricular systolic function is low normal. The right  ventricular size is normal. There is moderately elevated pulmonary artery  systolic pressure. The estimated right ventricular systolic pressure is  45.5 mmHg.   3. Left atrial size was mildly dilated.   4. The mitral valve has been repaired/replaced. No evidence of mitral  valve regurgitation. The mean mitral valve gradient is 5.0 mmHg. There is  a 25 mm Magna Ease bioprosthetic valve present in the mitral position.  Grossly normal function.   5.  The aortic valve has been repaired/replaced. Aortic valve  regurgitation is not visualized. There is a 21 mm Magna Ease bioprosthetic  valve present in the aortic position. Aortic valve mean gradient measures  7.0 mmHg. Grossly normal function.   6. The inferior vena cava is normal in size with greater than 50%  respiratory variability, suggesting right atrial pressure of 3 mmHg.     07/2013 Cath Impression: 1. No angiographic evidence of CAD 2. Elevated filling pressures     01/2021 nuclear stress   Atrial fibrillation present throughout. No diagnostic ST segment changes to indicate ischemia. Small, mild to moderate intensity, mid to apical anterior defect. This is fixed with the exception of a small degree of partial reversibility in the mid zone. Suggestive of scar versus variable soft tissue attenuation. This is a low risk study. Nuclear stress EF: 50%.   01/2021 carotid US  Summary:  Right Carotid: Velocities in the right ICA are consistent with a 1-39%  stenosis.   Left Carotid: Velocities in the left ICA are consistent with a 1-39%  stenosis.   Vertebrals:  Bilateral vertebral arteries demonstrate antegrade flow.  Subclavians: Normal flow hemodynamics were seen in bilateral subclavian               arteries.    Assessment and Plan   1. Chronic diastolic  HF - some ongoign SOB/DOE. Recent ER visit with SOB, imrpoved with IV lasix . - difficult balance with diuretic and her kidney function - try increasing torsemide  to 20mg  alternating days with 40mg . Check bmet/mg in 2 weeks.May have to accept somewhat higher Cr if her breathing improves with additional diuresis.      2. Afib/aflutter - maintaining SR since her DCCV, remains on amiodaorne - no palpitations, was hoping breathing would improve more with restoration of SR, however ongoing may be more related to volume status - continue current meds   3.Valvular heart disease - overall normal MVR and AVR function by most  recent echo       Dorn PHEBE Ross, M.D., F.A.C.C.

## 2024-08-15 NOTE — Progress Notes (Signed)
 Remote Loop Recorder Transmission

## 2024-08-15 NOTE — Patient Instructions (Signed)
 Medication Instructions:   Meclizine 25mg  every 6 hours as needed for dizziness Continue all other medications.     Labwork:  none  Testing/Procedures:  none  Follow-Up:  4 months   Any Other Special Instructions Will Be Listed Below (If Applicable).  You have been referred to:  Neurology   If you need a refill on your cardiac medications before your next appointment, please call your pharmacy.

## 2024-08-23 ENCOUNTER — Other Ambulatory Visit (HOSPITAL_COMMUNITY): Payer: Self-pay

## 2024-08-23 NOTE — Progress Notes (Signed)
 Infectious w/u

## 2024-08-26 ENCOUNTER — Ambulatory Visit (INDEPENDENT_AMBULATORY_CARE_PROVIDER_SITE_OTHER): Admitting: Otolaryngology

## 2024-08-26 ENCOUNTER — Encounter (INDEPENDENT_AMBULATORY_CARE_PROVIDER_SITE_OTHER): Payer: Self-pay | Admitting: Otolaryngology

## 2024-08-26 VITALS — BP 100/60 | HR 82 | Ht 62.5 in | Wt 148.0 lb

## 2024-08-26 DIAGNOSIS — S022XXD Fracture of nasal bones, subsequent encounter for fracture with routine healing: Secondary | ICD-10-CM

## 2024-08-26 DIAGNOSIS — S022XXS Fracture of nasal bones, sequela: Secondary | ICD-10-CM

## 2024-08-26 DIAGNOSIS — S0181XD Laceration without foreign body of other part of head, subsequent encounter: Secondary | ICD-10-CM | POA: Diagnosis not present

## 2024-08-26 DIAGNOSIS — S0232XD Fracture of orbital floor, left side, subsequent encounter for fracture with routine healing: Secondary | ICD-10-CM | POA: Diagnosis not present

## 2024-09-01 ENCOUNTER — Ambulatory Visit: Admission: EM | Admit: 2024-09-01 | Discharge: 2024-09-01 | Disposition: A | Discharge status: Home / Self Care

## 2024-09-01 ENCOUNTER — Encounter (HOSPITAL_COMMUNITY): Payer: Self-pay | Admitting: Emergency Medicine

## 2024-09-01 ENCOUNTER — Emergency Department (HOSPITAL_COMMUNITY)
Admission: EM | Admit: 2024-09-01 | Discharge: 2024-09-01 | Disposition: A | Discharge status: Home / Self Care | Attending: Emergency Medicine | Admitting: Emergency Medicine

## 2024-09-01 ENCOUNTER — Other Ambulatory Visit: Payer: Self-pay

## 2024-09-01 DIAGNOSIS — J9601 Acute respiratory failure with hypoxia: Secondary | ICD-10-CM

## 2024-09-01 DIAGNOSIS — R0682 Tachypnea, not elsewhere classified: Secondary | ICD-10-CM

## 2024-09-01 DIAGNOSIS — R300 Dysuria: Secondary | ICD-10-CM

## 2024-09-01 DIAGNOSIS — I509 Heart failure, unspecified: Secondary | ICD-10-CM | POA: Diagnosis not present

## 2024-09-01 DIAGNOSIS — I959 Hypotension, unspecified: Secondary | ICD-10-CM

## 2024-09-01 LAB — URINALYSIS, ROUTINE W REFLEX MICROSCOPIC
Bilirubin Urine: NEGATIVE
Glucose, UA: NEGATIVE mg/dL
Hgb urine dipstick: NEGATIVE
Ketones, ur: NEGATIVE mg/dL
Leukocytes,Ua: NEGATIVE
Nitrite: NEGATIVE
Protein, ur: NEGATIVE mg/dL
Specific Gravity, Urine: 1.002 — ABNORMAL LOW (ref 1.005–1.030)
pH: 7 (ref 5.0–8.0)

## 2024-09-01 NOTE — Discharge Instructions (Signed)
 You have opted to go to the emergency department by car today.  Do not drive yourself for safety reasons.  Go directly to the emergency department as we are very concerned about your vital signs and condition and feel you require further monitoring and workup that we do not have the ability to do in the setting.

## 2024-09-01 NOTE — ED Provider Notes (Signed)
 [name]   CSN: [id number] Arrival date & time: 09/01/24  1527     Patient presents with: Dysuria   [name] is a 78 y.o. female.    Dysuria  This patient is an 78 year old female, she has a history of congestive heart failure, she is on amiodarone  and apixaban  for her atrial fibrillation.  She presents from the urgent care after being told that she needed to come to the ER for evaluation.  She went there for burning with urination and thought she had a urinary tract infection, denies any abdominal pain fevers chills nausea or vomiting, she is not short of breath, she had an oxygen  of 89% and was found to be hypotensive, she has neither of those things here with an oxygen  of 95 to 99% at rest and a normal blood pressure of 135/63.  According to the spouse the patient has had a couple of episodes of antibiotics over the last couple of weeks for potential urinary tract infections    Prior to Admission medications   Medication Sig Start Date End Date Taking? Authorizing Provider  acetaminophen  (TYLENOL ) 325 MG tablet Take 2 tablets (650 mg total) by mouth every 6 (six) hours as needed. Patient taking differently: Take 650 mg by mouth every 6 (six) hours as needed for mild pain (pain score 1-3). 01/20/24  Yes [name], DO  albuterol  (VENTOLIN  HFA) 108 (90 Base) MCG/ACT inhaler Inhale 2 puffs into the lungs every 6 (six) hours as needed for wheezing or shortness of breath. 08/17/23  Yes [provider]  amiodarone  (PACERONE ) 200 MG tablet Take 1 tablet (200 mg total) by mouth daily. Patient taking differently: Take 200 mg by mouth in the morning. 02/02/24  Yes Mealor, [name], MD  apixaban  (ELIQUIS ) 5 MG TABS tablet Take 1 tablet (5 mg total) by mouth 2 (two) times daily. 07/26/24  Yes Shahmehdi, Seyed A, MD  atorvastatin  (LIPITOR) 40 MG tablet Take 1 tablet (40 mg total) by mouth daily. 07/05/24  Yes BranchDorn FALCON, MD  azelastine  (ASTELIN ) 0.1 % nasal spray Place 1 spray into both nostrils 2 (two) times daily. Use in each nostril as directed Patient taking differently: Place 1 spray into both nostrils daily as needed for allergies. Use in each nostril as directed 04/30/24  Yes [name], NP  escitalopram  (LEXAPRO ) 10 MG tablet Take 10 mg by mouth daily with breakfast. 04/10/15  Yes [provider]  feeding supplement (ENSURE PLUS HIGH PROTEIN) LIQD Take 237 mLs by mouth 2 (two) times daily between meals. 07/23/24  Yes Shahmehdi, [name], MD  levothyroxine  (SYNTHROID ) 100 MCG tablet Take 100 mcg by mouth daily before breakfast.   Yes [provider]  meclizine  (ANTIVERT ) 25 MG tablet Take 1 tablet (25 mg total) by mouth every 6 (six) hours as needed for dizziness. 08/15/24  Yes BranchDorn FALCON, MD  metFORMIN  (GLUCOPHAGE ) 500 MG tablet Take 1 tablet (500 mg total) by mouth 2 (two) times daily with a meal. 08/03/13  Yes Collins, Gina L, PA-C  Multiple Vitamin (MULTIVITAMIN WITH MINERALS) TABS tablet Take 1 tablet by mouth in the morning.   Yes [provider]  ondansetron  (ZOFRAN -ODT) 4 MG disintegrating tablet Take 4 mg by mouth every 6 (six) hours as needed for nausea or vomiting. 07/18/24  Yes [provider]  pantoprazole  (PROTONIX ) 40 MG tablet TAKE 1 TABLET 30 TO 60 MINUTES BEFORE FIRST MEAL OF  THE DAY. Patient taking differently: Take 40 mg by mouth daily before breakfast. 03/15/24  Yes Wert, Ozell NOVAK, MD  Polyethyl Glycol-Propyl Glycol (SYSTANE OP) Place 1 drop into both eyes daily as needed (dry eyes).   Yes [provider]  potassium chloride  SA (KLOR-CON  M) 20 MEQ tablet Take 1 tablet (20 mEq total) by mouth daily. Patient taking differently: Take 20 mEq by mouth in the morning. 11/14/22 09/01/24 Yes Rai, Ripudeep K, MD  torsemide  (DEMADEX ) 20 MG tablet Take 1 tablet (20 mg total) by mouth daily. 07/25/24 09/01/24 Yes Shahmehdi, [name], MD   TRELEGY ELLIPTA  100-62.5-25 MCG/ACT AEPB Inhale 1 puff into the lungs daily. 06/25/24  Yes [provider]  cefdinir  (OMNICEF ) 300 MG capsule Take 300 mg by mouth 2 (two) times daily. Patient not taking: Reported on 09/01/2024 08/13/24   [provider]    Allergies: Patient has no known allergies.    Review of Systems  Genitourinary:  Positive for dysuria.  All other systems reviewed and are negative.   Updated Vital Signs BP (!) 128/52   Pulse 66   Temp 98.3 F (36.8 C) (Oral)   Resp 10   Ht 1.581 m (5' 2.25)   Wt 67.1 kg   SpO2 93%   BMI 26.84 kg/m   Physical Exam Vitals and nursing [name] reviewed.  Constitutional:      General: She is not in acute distress.    Appearance: She is well-developed.  HENT:     Head: Normocephalic and atraumatic.     Mouth/Throat:     Pharynx: No oropharyngeal exudate.  Eyes:     General: No scleral icterus.       Right eye: No discharge.        Left eye: No discharge.     Conjunctiva/sclera: Conjunctivae normal.     Pupils: Pupils are equal, round, and reactive to light.  Neck:     Thyroid : No thyromegaly.     Vascular: No JVD.  Cardiovascular:     Rate and Rhythm: Normal rate and regular rhythm.     Heart sounds: Normal heart sounds. No murmur heard.    No friction rub. No gallop.  Pulmonary:     Effort: Pulmonary effort is normal. No respiratory distress.     Breath sounds: Normal breath sounds. No wheezing or rales.  Abdominal:     General: Bowel sounds are normal. There is no distension.     Palpations: Abdomen is soft. There is no mass.     Tenderness: There is no abdominal tenderness.  Musculoskeletal:        General: No tenderness. Normal range of motion.     Cervical back: Normal range of motion and neck supple.     Right lower leg: No edema.     Left lower leg: No edema.  Lymphadenopathy:     Cervical: No cervical adenopathy.  Skin:    General: Skin is warm and dry.     Findings: No erythema or  rash.  Neurological:     Mental Status: She is alert.     Coordination: Coordination normal.  Psychiatric:        Behavior: Behavior normal.     (all labs ordered are listed, but only abnormal results are displayed) Labs Reviewed  URINALYSIS, ROUTINE W REFLEX MICROSCOPIC - Abnormal; Notable for the following components:      Result Value   Color, Urine STRAW (*)    Specific Gravity, Urine 1.002 (*)  All other components within normal limits  URINE CULTURE    EKG: None  Radiology: No results found.   Procedures   Medications Ordered in the ED - No data to display                                  Medical Decision Making Amount and/or Complexity of Data Reviewed Labs: ordered.    This patient presents to the ED for concern of dysuria differential diagnosis includes UTI, cystitis, doubt diverticulitis, no abdominal tenderness    Additional history obtained   Additional history obtained from Electronic Medical Record External records from outside source obtained and reviewed including medical record including prior admissions, CHF evaluations   Lab Tests:  I Ordered, and personally interpreted labs.  The pertinent results include: No signs of UTI on the urinalysis which appears clean   Vaginal exam performed with chaperone nurse at the bedside, the patient has slight erythematous appearing vaginal tissue, no foul smell, no vaginal discharge, no obvious foreign body, no tenderness otherwise around the vaginal area  Problem List / ED Course:  Patient has vaginitis with some dysuria, can follow-up outpatient, has no discharge no foul smell no thick white clumpy discharge, she is already using Monistat based on what she describes to me.   Social Determinants of Health:  Referred to gynecology        Final diagnoses:  Dysuria    ED Discharge Orders     None          Cleotilde Rogue, MD 09/01/24 1718

## 2024-09-01 NOTE — ED Triage Notes (Signed)
 Pt reports possible UTI/ yeast infection. Difficulty urinating, burning with urination, vaginal discomfort.   Husband is concerned about her decreased oxygen  levels.

## 2024-09-01 NOTE — ED Provider Notes (Addendum)
 [facility]    CSN: [id number] Arrival date & time: 09/01/24  1444      History   Chief Complaint No chief complaint on file.   HPI [name] is a 78 y.o. female.   Patient with past medical history significant for hypertension, CVA, rheumatic heart disease, CHF, atrial fibrillation, asthma, COPD, history of acute respiratory failure presenting today with known hypoxia with sats ranging from low 80s to low 90s the past few days, dyspnea on exertion as well as dysuria, urinary hesitancy, vaginal discomfort.  Denies known fevers, mental status changes, chest pain, abdominal pain, vomiting, diarrhea.  Presenting today with her husband.  Not trying anything over-the-counter for symptoms.    Past Medical History:  Diagnosis Date   Anemia    Asthma 2010   Atrial fibrillation (HCC)    Atrial flutter (HCC)    Breast carcinoma (HCC) 1995   1995   Cardioembolic stroke (HCC) 01/2012   Right frontal in 01/2012; normal carotid ultrasound; possible LAA thrombus by TEE; virtual complete neurologic recovery   Carotid artery stenosis 1961   Chronic kidney disease, stage 2, mildly decreased GFR    GFR of approximately 60   Diabetes mellitus without complication (HCC) 5+ years ago   Controlled most of time   Diverticulosis of colon (without mention of hemorrhage) 2012   Dr. Golda   Fasting hyperglycemia    120 fasting   Gastroesophageal reflux disease    Gastroparesis    Heart failure with improved ejection fraction (HFimpEF) (HCC) 5+ years   Heart murmur 1961   Hemorrhoids    History of aortic valve replacement with bioprosthetic valve    History of mitral valve replacement with bioprosthetic valve    Hyperlipidemia    Hypertension    pt denies 05/30/13     Dr Alvan chester   Hyponatremia    Hypothyroidism    Rheumatic heart disease    Shortness of breath    Syncope and collapse February, 2024    Patient Active Problem List   Diagnosis Date Noted    Generalized weakness 07/22/2024   Elevated d-dimer 07/22/2024   Chronic kidney disease, stage 3b (HCC) 07/22/2024   Chronic heart failure with preserved ejection fraction (HFpEF) (HCC) 07/22/2024   Mood disorder 07/22/2024   Fracture of orbital floor, closed (HCC) 06/29/2024   Injury of face 06/29/2024   Lip laceration 06/29/2024   Chin laceration 06/29/2024   Intraparenchymal hematoma of brain (HCC) 06/28/2024   Heart failure with recovered ejection fraction (HFrecEF) (HCC) 06/28/2024   Atypical atrial flutter (HCC) 01/04/2023   Syncope 11/11/2022   Acute respiratory failure with hypoxia (HCC) 03/31/2022   Acute congestive heart failure (HCC) 03/31/2022   Pneumonia 03/31/2022   RSV infection 06/03/2021   COPD with acute exacerbation (HCC) 06/01/2021   Type 2 diabetes mellitus treated without insulin  (HCC) 06/01/2021   COPD exacerbation (HCC) 06/01/2021   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   Asthma exacerbation 11/12/2016   Synovial cyst of lumbar spine 05/13/2014   Encounter for therapeutic drug monitoring 11/06/2013   Hyponatremia    Atrial fibrillation (HCC)    Moderate aortic stenosis 07/12/2013   Moderate aortic insufficiency 07/12/2013   S/P MVR (mitral valve replacement) 07/12/2013   Severe mitral valve stenosis 07/09/2013   Mitral stenosis 07/08/2013   Chest pain 07/07/2013   Asthma, cough variant 03/27/2013   Upper airway cough syndrome in pt with mild cough variant asthma  09/17/2012   Dyspnea on  exertion 06/13/2012   Edema 06/13/2012   Rheumatic heart disease 06/13/2012   Fasting hyperglycemia    Chronic anticoagulation 01/26/2012   Acquired hypothyroidism 01/23/2012   Cardioembolic stroke (HCC) 01/02/2012   Mixed hyperlipidemia    Essential hypertension    Breast carcinoma (HCC)    Gastroesophageal reflux disease     Past Surgical History:  Procedure Laterality Date   AORTIC VALVE REPLACEMENT N/A 07/18/2013   Procedure: AORTIC VALVE REPLACEMENT (AVR);   Surgeon: Dorise MARLA Fellers, MD;  Location: [facility] OR;  Service: Open Heart Surgery;  Laterality: N/A;   BACK SURGERY     BREAST LUMPECTOMY Right 10/03/1993   CARDIAC CATHETERIZATION  5+ years   CARDIOVERSION N/A 07/26/2013   Procedure: CARDIOVERSION;  Surgeon: Vina LULLA Gull, MD;  Location: [facility] ENDOSCOPY;  Service: Cardiovascular;  Laterality: N/A;   CARDIOVERSION N/A 01/04/2023   Procedure: CARDIOVERSION;  Surgeon: Hobart Powell BRAVO, MD;  Location: [facility] ENDOSCOPY;  Service: Cardiovascular;  Laterality: N/A;   CARDIOVERSION N/A 02/23/2024   Procedure: CARDIOVERSION;  Surgeon: [name], MD;  Location: [facility] ORS;  Service: Endoscopy;  Laterality: N/A;   COLONOSCOPY  10/03/2010   Negative screening procedure   DILATION AND CURETTAGE OF UTERUS     ESOPHAGEAL MANOMETRY N/A 06/17/2013   Procedure: ESOPHAGEAL MANOMETRY (EM);  Surgeon: Alm JONELLE Gander, MD;  Location: [facility] ENDOSCOPY;  Service: Endoscopy;  Laterality: N/A;   INTRAOPERATIVE TRANSESOPHAGEAL ECHOCARDIOGRAM N/A 07/18/2013   Procedure: INTRAOPERATIVE TRANSESOPHAGEAL ECHOCARDIOGRAM;  Surgeon: Dorise MARLA Fellers, MD;  Location: [facility] OR;  Service: Open Heart Surgery;  Laterality: N/A;   LEFT AND RIGHT HEART CATHETERIZATION WITH CORONARY ANGIOGRAM N/A 07/10/2013   Procedure: LEFT AND RIGHT HEART CATHETERIZATION WITH CORONARY ANGIOGRAM;  Surgeon: Lonni JONETTA Cash, MD;  Location: [facility] C[facility] LAB;  Service: Cardiovascular;  Laterality: N/A;   LOOP RECORDER INSERTION N/A 07/02/2024   Procedure: LOOP RECORDER INSERTION;  Surgeon: Nancey Eulas BRAVO, MD;  Location: [facility] INVASIVE CV LAB;  Service: Cardiovascular;  Laterality: N/A;   LUMBAR LAMINECTOMY/DECOMPRESSION MICRODISCECTOMY Right 05/13/2014   Procedure: LUMBAR LAMINECTOMY/DECOMPRESSION MICRODISCECTOMY 1 LEVEL  lumbar four/five;  Surgeon: Darina MALVA Boehringer, MD;  Location: [facility] NEURO ORS;  Service: Neurosurgery;  Laterality: Right;   MITRAL VALVE REPLACEMENT N/A 07/18/2013   Procedure: MITRAL VALVE (MV)  REPLACEMENT;  Surgeon: Dorise MARLA Fellers, MD;  Location: [facility] OR;  Service: Open Heart Surgery;  Laterality: N/A;   MITRAL VALVE SURGERY  10/03/1960   [facility], closed mitral valvulotomy by finger fracture   TEE WITHOUT CARDIOVERSION  01/24/2012   Procedure: TRANSESOPHAGEAL ECHOCARDIOGRAM (TEE);  Surgeon: Redell GORMAN Shallow, MD;  Location: [facility] ENDOSCOPY;  Service: Cardiovascular;  Laterality: N/A;   TEE WITHOUT CARDIOVERSION N/A 07/11/2013   Procedure: TRANSESOPHAGEAL ECHOCARDIOGRAM (TEE);  Surgeon: Aleene JINNY Passe, MD;  Location: [facility] ENDOSCOPY;  Service: Cardiovascular;  Laterality: N/A;   TEE WITHOUT CARDIOVERSION N/A 07/26/2013   Procedure: TRANSESOPHAGEAL ECHOCARDIOGRAM (TEE);  Surgeon: Vina LULLA Gull, MD;  Location: [facility] ENDOSCOPY;  Service: Cardiovascular;  Laterality: N/A;   TUBAL LIGATION  10/04/1971    OB History   No obstetric history on file.      Home Medications    Prior to Admission medications   Medication Sig Start Date End Date Taking? Authorizing Provider  acetaminophen  (TYLENOL ) 325 MG tablet Take 2 tablets (650 mg total) by mouth every 6 (six) hours as needed. 01/20/24   [name], DO  albuterol  (VENTOLIN  HFA) 108 (90 Base) MCG/ACT inhaler Inhale 2 puffs into the lungs every 6 (six)  hours as needed for wheezing or shortness of breath. 08/17/23   [provider]  amiodarone  (PACERONE ) 200 MG tablet Take 1 tablet (200 mg total) by mouth daily. Patient taking differently: Take 200 mg by mouth in the morning. 02/02/24   Mealor, [name], MD  apixaban  (ELIQUIS ) 5 MG TABS tablet Take 1 tablet (5 mg total) by mouth 2 (two) times daily. 07/26/24   Shahmehdi, [name], MD  atorvastatin  (LIPITOR) 40 MG tablet Take 1 tablet (40 mg total) by mouth daily. 07/05/24   [name], MD  azelastine  (ASTELIN ) 0.1 % nasal spray Place 1 spray into both nostrils 2 (two) times daily. Use in each nostril as directed Patient taking differently: Place 1 spray into both nostrils daily as  needed for allergies. Use in each nostril as directed 04/30/24   [name], NP  cefdinir  (OMNICEF ) 300 MG capsule Take 300 mg by mouth 2 (two) times daily. 08/13/24   [provider]  escitalopram  (LEXAPRO ) 10 MG tablet Take 10 mg by mouth daily with breakfast. 04/10/15   [provider]  feeding supplement (ENSURE PLUS HIGH PROTEIN) LIQD Take 237 mLs by mouth 2 (two) times daily between meals. 07/23/24   Shahmehdi, [name], MD  levothyroxine  (SYNTHROID ) 100 MCG tablet Take 100 mcg by mouth daily before breakfast.    [provider]  meclizine  (ANTIVERT ) 25 MG tablet Take 1 tablet (25 mg total) by mouth every 6 (six) hours as needed for dizziness. 08/15/24   [name], MD  metFORMIN  (GLUCOPHAGE ) 500 MG tablet Take 1 tablet (500 mg total) by mouth 2 (two) times daily with a meal. 08/03/13   Collins, Tillman CROME, PA-C  Multiple Vitamin (MULTIVITAMIN WITH MINERALS) TABS tablet Take 1 tablet by mouth in the morning.    [provider]  ondansetron  (ZOFRAN -ODT) 4 MG disintegrating tablet Take 4 mg by mouth every 6 (six) hours as needed for nausea or vomiting. 07/18/24   [provider]  oxyCODONE  (OXY IR/ROXICODONE ) 5 MG immediate release tablet Take 1 tablet (5 mg total) by mouth every 6 (six) hours as needed for moderate pain (pain score 4-6). Patient not taking: Reported on 08/26/2024 07/10/24   Gherghe, Costin M, MD  pantoprazole  (PROTONIX ) 40 MG tablet TAKE 1 TABLET 30 TO 60 MINUTES BEFORE FIRST MEAL OF THE DAY. Patient taking differently: Take 40 mg by mouth daily before breakfast. 03/15/24   Darlean Ozell NOVAK, MD  Polyethyl Glycol-Propyl Glycol (SYSTANE OP) Place 1 drop into both eyes daily as needed (dry eyes).    [provider]  potassium chloride  SA (KLOR-CON  M) 20 MEQ tablet Take 1 tablet (20 mEq total) by mouth daily. Patient taking differently: Take 20 mEq by mouth in the morning. 11/14/22 08/26/24  Rai, Nydia POUR, MD  torsemide   (DEMADEX ) 20 MG tablet Take 1 tablet (20 mg total) by mouth daily. 07/25/24 08/26/24  [name], MD  TRELEGY ELLIPTA  100-62.5-25 MCG/ACT AEPB Inhale 1 puff into the lungs daily. 06/25/24   [provider]    Family History Family History  Problem Relation Age of Onset   Heart disease Brother 21       MI   Rheum arthritis Maternal Grandmother    Asthma Maternal Grandfather    Hypothyroidism Mother    Diabetes Sister    Cirrhosis Father     Social History Social History   Tobacco Use   Smoking status: Never   Smokeless tobacco: Never  Vaping Use  Vaping status: Never Used  Substance Use Topics   Alcohol use: No    Alcohol/week: 0.0 standard drinks of alcohol   Drug use: No     Allergies   Patient has no known allergies.   Review of Systems Review of Systems PER HPI  Physical Exam Triage Vital Signs ED Triage Vitals  Encounter Vitals Group     BP 09/01/24 1455 (!) 99/58     Girls Systolic BP Percentile --      Girls Diastolic BP Percentile --      Boys Systolic BP Percentile --      Boys Diastolic BP Percentile --      Pulse --      Resp 09/01/24 1455 (!) 22     Temp 09/01/24 1455 98.7 F (37.1 C)     Temp Source 09/01/24 1455 Oral     SpO2 09/01/24 1455 (!) 88 %     Weight --      Height --      Head Circumference --      Peak Flow --      Pain Score 09/01/24 1500 0     Pain Loc --      Pain Education --      Exclude from Growth Chart --    No data found.  Updated Vital Signs BP (!) 99/58 (BP Location: Right Arm)   Pulse 73   Temp 98.7 F (37.1 C) (Oral)   Resp (!) 22   SpO2 (!) 88%   Visual Acuity Right Eye Distance:   Left Eye Distance:   Bilateral Distance:    Right Eye Near:   Left Eye Near:    Bilateral Near:     Exam significantly abbreviated today given unstable vital signs and symptoms, decision was made immediately from triage to go to the emergency department Physical Exam Vitals and nursing note reviewed.   Constitutional:      Appearance: She is ill-appearing.  HENT:     Head: Atraumatic.  Eyes:     Extraocular Movements: Extraocular movements intact.     Conjunctiva/sclera: Conjunctivae normal.  Cardiovascular:     Rate and Rhythm: Normal rate.  Pulmonary:     Comments: Tachypneic, unable to speak in full sentences Musculoskeletal:     Cervical back: Normal range of motion and neck supple.     Comments: At baseline per patient and husband  Skin:    General: Skin is warm and dry.  Neurological:     Mental Status: She is alert. Mental status is at baseline.  Psychiatric:        Behavior: Behavior normal.      UC Treatments / Results  Labs (all labs ordered are listed, but only abnormal results are displayed) Labs Reviewed - No data to display  EKG   Radiology No results found.  Procedures Procedures (including critical care time)  Medications Ordered in UC Medications - No data to display  Initial Impression / Assessment and Plan / UC Course  I have reviewed the triage vital signs and the nursing notes.  Pertinent labs & imaging results that were available during my care of the patient were reviewed by me and considered in my medical decision making (see chart for details).     Patient hypotensive, tachypneic and hypoxic in triage, appears ill and has numerous complicated chronic medical conditions.  Discussed with patient and husband that we do not have the resources to further evaluate, monitor and stabilize her at  this time so we do recommend further evaluation in the emergency department.  They are agreeable to this plan, EMS was recommended but patient and husband declined and wished to go via private vehicle.  Risks reviewed with this.  Report was called to [facility] ED.  Final Clinical Impressions(s) / UC Diagnoses   Final diagnoses:  Acute respiratory failure with hypoxia (HCC)  Hypotension, unspecified hypotension type  Tachypnea  Dysuria      Discharge Instructions      You have opted to go to the emergency department by car today.  Do not drive yourself for safety reasons.  Go directly to the emergency department as we are very concerned about your vital signs and condition and feel you require further monitoring and workup that we do not have the ability to do in the setting.    ED Prescriptions   None    PDMP not reviewed this encounter.   Stuart Vernell Norris, PA-C 09/01/24 1528    Stuart Vernell Racker[place], [place] 09/01/24 1536

## 2024-09-01 NOTE — ED Notes (Signed)
 Pt placed on 1 liter [place]. 02 saturation 92%. Provider aware.

## 2024-09-01 NOTE — ED Triage Notes (Signed)
 Pt c/o dysuria that started this am. Pt was seen at urgent care earlier for the same. Pt was hypoxic there at 88% on room air. Pt states she is always sob.

## 2024-09-01 NOTE — Discharge Instructions (Addendum)
 Your testing today shows no signs of urinary infection, your blood pressure and your oxygen  levels have both been normal, you need to follow-up with a gynecologist, if you do not have 1 see the phone number above for the family tree  Please call first thing tomorrow morning to make an appointment to be seen this week  Please avoid using any topical medications in the vaginal area until you are seen by the gynecologist

## 2024-09-01 NOTE — ED Notes (Signed)
Report called to Wilkie Aye, RN at [facility] ED.

## 2024-09-01 NOTE — ED Notes (Signed)
 Patient is being discharged from the Urgent Care and sent to the Emergency Department via POV . Per PA, patient is in need of higher level of care due to hypoxia/abnormal vital signs. Patient is aware and verbalizes understanding of plan of care.  Vitals:   09/01/24 1455  BP: (!) 99/58  Resp: (!) 22  Temp: 98.7 F (37.1 C)  SpO2: (!) 88%    Pt and pt family declined EMS.

## 2024-09-01 NOTE — ED Notes (Signed)
 Ambulated pt with pulse ox maintaining 91%. Heart rate of 70-76.

## 2024-09-03 DIAGNOSIS — I480 Paroxysmal atrial fibrillation: Secondary | ICD-10-CM | POA: Diagnosis not present

## 2024-09-03 DIAGNOSIS — R55 Syncope and collapse: Secondary | ICD-10-CM | POA: Diagnosis not present

## 2024-09-03 DIAGNOSIS — I5032 Chronic diastolic (congestive) heart failure: Secondary | ICD-10-CM | POA: Diagnosis not present

## 2024-09-04 LAB — URINE CULTURE: Culture: 30000 — AB

## 2024-09-05 ENCOUNTER — Telehealth (HOSPITAL_BASED_OUTPATIENT_CLINIC_OR_DEPARTMENT_OTHER): Payer: Self-pay | Admitting: *Deleted

## 2024-09-05 NOTE — Telephone Encounter (Signed)
 Post ED Visit - Positive Culture Follow-up  Culture report reviewed by antimicrobial stewardship pharmacist: [name] [x]  Powell Blush, Pharm.D. []  Venetia Gully, Pharm.D., BCPS AQ-ID []  Garrel Crews, Pharm.D., BCPS []  Almarie Lunger, Pharm.D., BCPS []  [place], [place].D., BCPS, AAHIVP []  Rosaline Bihari, Pharm.D., BCPS, AAHIVP []  Vernell Meier, PharmD, BCPS []  Latanya Hint, PharmD, BCPS []  Donald Medley, PharmD, BCPS []  Rocky Bold, PharmD []  Dorothyann Alert, PharmD, BCPS []  Morene Babe, PharmD  [facility] []  Rosaline Edison, PharmD []  Romona Bliss, PharmD []  Dolphus Roller, PharmD []  Veva Seip, Rph []  Vernell Daunt) Leonce, PharmD []  Eva Allis, PharmD []  Rosaline Millet, PharmD []  Iantha Batch, PharmD []  Arvin Gauss, PharmD []  Wanda Hasting, PharmD []  Ronal Rav, PharmD []  Rocky Slade, PharmD []  Bard Jeans, PharmD   Positive urine culture Called pt for sx check.  Her husband states that he has already contacted her PCP after seeing the results on MyChart.  Pt's PCP prescribed antibiotics, and pt started taking them today.  No further patient follow-up is required at this time.  Lorita Barnie Pereyra 09/05/2024, 4:59 PM

## 2024-09-05 NOTE — Progress Notes (Signed)
 ED Antimicrobial Stewardship Positive Culture Follow Up   [name] is an 78 y.o. female who presented to [facility] with a chief complaint of  Chief Complaint  Patient presents with   Dysuria    Recent Results (from the past 720 hours)  Urine Culture     Status: Abnormal   Collection Time: 09/01/24  3:57 PM   Specimen: Urine, Clean Catch  Result Value Ref Range Status   Specimen Description   Final    URINE, CLEAN CATCH Performed at [facility], [address], [place], [place] [address]    Special Requests   Final    NONE Performed at [facility], [address], [place], [place] [address]    Culture 30,000 COLONIES/mL ESCHERICHIA COLI (A)  Final   Report Status 09/04/2024 FINAL  Final   Organism ID, Bacteria ESCHERICHIA COLI (A)  Final      Susceptibility   Escherichia coli - MIC*    AMPICILLIN  >=32 RESISTANT Resistant     CEFAZOLIN  (URINE) Value in next row Sensitive      4 SENSITIVEThis is a modified FDA-approved test that has been validated and its performance characteristics determined by the reporting laboratory.  This laboratory is certified under the Clinical Laboratory Improvement Amendments CLIA as qualified to perform high complexity clinical laboratory testing.    CEFEPIME Value in next row Sensitive      4 SENSITIVEThis is a modified FDA-approved test that has been validated and its performance characteristics determined by the reporting laboratory.  This laboratory is certified under the Clinical Laboratory Improvement Amendments CLIA as qualified to perform high complexity clinical laboratory testing.    ERTAPENEM Value in next row Sensitive      4 SENSITIVEThis is a modified FDA-approved test that has been validated and its performance characteristics determined by the reporting laboratory.  This laboratory is certified under the Clinical Laboratory Improvement Amendments CLIA as qualified to perform high complexity clinical laboratory testing.    CEFTRIAXONE   Value in next row Sensitive      4 SENSITIVEThis is a modified FDA-approved test that has been validated and its performance characteristics determined by the reporting laboratory.  This laboratory is certified under the Clinical Laboratory Improvement Amendments CLIA as qualified to perform high complexity clinical laboratory testing.    CIPROFLOXACIN Value in next row Sensitive      4 SENSITIVEThis is a modified FDA-approved test that has been validated and its performance characteristics determined by the reporting laboratory.  This laboratory is certified under the Clinical Laboratory Improvement Amendments CLIA as qualified to perform high complexity clinical laboratory testing.    GENTAMICIN Value in next row Sensitive      4 SENSITIVEThis is a modified FDA-approved test that has been validated and its performance characteristics determined by the reporting laboratory.  This laboratory is certified under the Clinical Laboratory Improvement Amendments CLIA as qualified to perform high complexity clinical laboratory testing.    NITROFURANTOIN  Value in next row Sensitive      4 SENSITIVEThis is a modified FDA-approved test that has been validated and its performance characteristics determined by the reporting laboratory.  This laboratory is certified under the Clinical Laboratory Improvement Amendments CLIA as qualified to perform high complexity clinical laboratory testing.    TRIMETH/SULFA Value in next row Sensitive      4 SENSITIVEThis is a modified FDA-approved test that has been validated and its performance characteristics determined by the reporting laboratory.  This laboratory is certified under the  Clinical Laboratory Improvement Amendments CLIA as qualified to perform high complexity clinical laboratory testing.    AMPICILLIN /SULBACTAM Value in next row Resistant      4 SENSITIVEThis is a modified FDA-approved test that has been validated and its performance characteristics determined by the  reporting laboratory.  This laboratory is certified under the Clinical Laboratory Improvement Amendments CLIA as qualified to perform high complexity clinical laboratory testing.    PIP/TAZO Value in next row Sensitive      <=4 SENSITIVEThis is a modified FDA-approved test that has been validated and its performance characteristics determined by the reporting laboratory.  This laboratory is certified under the Clinical Laboratory Improvement Amendments CLIA as qualified to perform high complexity clinical laboratory testing.    MEROPENEM Value in next row Sensitive      <=4 SENSITIVEThis is a modified FDA-approved test that has been validated and its performance characteristics determined by the reporting laboratory.  This laboratory is certified under the Clinical Laboratory Improvement Amendments CLIA as qualified to perform high complexity clinical laboratory testing.    * 30,000 COLONIES/mL ESCHERICHIA COLI    Patient discharged originally without antimicrobial agent and treatment is may be indicated  Symptom check-   No symptoms: no changes  Symptoms: start Keflex , New antibiotic prescription: Keflex  500mg  BID x 5 days.   ED Provider: [place], PA-C    Powell Blush, PharmD, BCCCP  09/05/2024, 10:42 AM Clinical Pharmacist Monday - Friday phone -  [phone] Saturday - Sunday phone - [phone]

## 2024-09-07 NOTE — Progress Notes (Signed)
 Dear Dr. Sheryle, Here is my assessment for our mutual patient, [name]. Thank you for allowing me the opportunity to care for your patient. Please do not hesitate to contact me should you have any other questions. Sincerely, Dr. Eldora Blanch  Otolaryngology Clinic Note Referring provider: Dr. Sheryle HPI:  [name] is a 78 y.o. female kindly referred by Dr. Sheryle for evaluation of facial trauma  Initially seen 06/28/2024 in ED: seen for left NB facture and facial lacerations, repaired with left orbital floor and medial wall fracture.  --------------------------------------------------------- 08/26/2024 Seen in follow up. Overall doing well from facial fracture standpoint. No issues from laceration standpoint Patient additionally denies:  - malocclusion, teeth instability, trismus - enophthalmos, hypoglobus, vision loss or change, diplopia - significant facial deformity - trouble chewing or swallowing - epistaxis, hearing loss, nasal obstruction, cosmetic deformity  Personal or FHx of bleeding dz or anesthesia difficulty: no  Independent Review of Additional Tests or Records:  ED notes reviewed 06/2024 CT Face reviewed 06/2024 Dr. Corbin ophtho notes (06/28/2024) notes reviewed  PMH/Meds/All/SocHx/FamHx/ROS:   Past Medical History:  Diagnosis Date   Anemia    Asthma 2010   Atrial fibrillation (HCC)    Atrial flutter (HCC)    Breast carcinoma (HCC) 1995   1995   Cardioembolic stroke (HCC) 01/2012   Right frontal in 01/2012; normal carotid ultrasound; possible LAA thrombus by TEE; virtual complete neurologic recovery   Carotid artery stenosis 1961   Chronic kidney disease, stage 2, mildly decreased GFR    GFR of approximately 60   Diabetes mellitus without complication (HCC) 5+ years ago   Controlled most of time   Diverticulosis of colon (without mention of hemorrhage) 2012   Dr. Golda   Fasting hyperglycemia    120 fasting   Gastroesophageal reflux disease     Gastroparesis    Heart failure with improved ejection fraction (HFimpEF) (HCC) 5+ years   Heart murmur 1961   Hemorrhoids    History of aortic valve replacement with bioprosthetic valve    History of mitral valve replacement with bioprosthetic valve    Hyperlipidemia    Hypertension    pt denies 05/30/13     Dr Alvan chester   Hyponatremia    Hypothyroidism    Rheumatic heart disease    Shortness of breath    Syncope and collapse February, 2024     Past Surgical History:  Procedure Laterality Date   AORTIC VALVE REPLACEMENT N/A 07/18/2013   Procedure: AORTIC VALVE REPLACEMENT (AVR);  Surgeon: Dorise MARLA Fellers, MD;  Location: [facility] OR;  Service: Open Heart Surgery;  Laterality: N/A;   BACK SURGERY     BREAST LUMPECTOMY Right 10/03/1993   CARDIAC CATHETERIZATION  5+ years   CARDIOVERSION N/A 07/26/2013   Procedure: CARDIOVERSION;  Surgeon: Vina LULLA Gull, MD;  Location: [facility] ENDOSCOPY;  Service: Cardiovascular;  Laterality: N/A;   CARDIOVERSION N/A 01/04/2023   Procedure: CARDIOVERSION;  Surgeon: Hobart Powell BRAVO, MD;  Location: [facility] ENDOSCOPY;  Service: Cardiovascular;  Laterality: N/A;   CARDIOVERSION N/A 02/23/2024   Procedure: CARDIOVERSION;  Surgeon: [name], MD;  Location: [facility] ORS;  Service: Endoscopy;  Laterality: N/A;   COLONOSCOPY  10/03/2010   Negative screening procedure   DILATION AND CURETTAGE OF UTERUS     ESOPHAGEAL MANOMETRY N/A 06/17/2013   Procedure: ESOPHAGEAL MANOMETRY (EM);  Surgeon: Alm JONELLE Gander, MD;  Location: [facility] ENDOSCOPY;  Service: Endoscopy;  Laterality: N/A;   INTRAOPERATIVE TRANSESOPHAGEAL ECHOCARDIOGRAM N/A 07/18/2013  Procedure: INTRAOPERATIVE TRANSESOPHAGEAL ECHOCARDIOGRAM;  Surgeon: Dorise MARLA Fellers, MD;  Location: [facility] OR;  Service: Open Heart Surgery;  Laterality: N/A;   LEFT AND RIGHT HEART CATHETERIZATION WITH CORONARY ANGIOGRAM N/A 07/10/2013   Procedure: LEFT AND RIGHT HEART CATHETERIZATION WITH CORONARY ANGIOGRAM;  Surgeon:  Lonni JONETTA Cash, MD;  Location: [facility] CATH LAB;  Service: Cardiovascular;  Laterality: N/A;   LOOP RECORDER INSERTION N/A 07/02/2024   Procedure: LOOP RECORDER INSERTION;  Surgeon: Nancey Eulas BRAVO, MD;  Location: [facility] INVASIVE CV LAB;  Service: Cardiovascular;  Laterality: N/A;   LUMBAR LAMINECTOMY/DECOMPRESSION MICRODISCECTOMY Right 05/13/2014   Procedure: LUMBAR LAMINECTOMY/DECOMPRESSION MICRODISCECTOMY 1 LEVEL  lumbar four/five;  Surgeon: Darina MALVA Boehringer, MD;  Location: [facility] NEURO ORS;  Service: Neurosurgery;  Laterality: Right;   MITRAL VALVE REPLACEMENT N/A 07/18/2013   Procedure: MITRAL VALVE (MV) REPLACEMENT;  Surgeon: Dorise MARLA Fellers, MD;  Location: [facility] OR;  Service: Open Heart Surgery;  Laterality: N/A;   MITRAL VALVE SURGERY  10/03/1960   [facility], closed mitral valvulotomy by finger fracture   TEE WITHOUT CARDIOVERSION  01/24/2012   Procedure: TRANSESOPHAGEAL ECHOCARDIOGRAM (TEE);  Surgeon: Redell GORMAN Shallow, MD;  Location: [facility] ENDOSCOPY;  Service: Cardiovascular;  Laterality: N/A;   TEE WITHOUT CARDIOVERSION N/A 07/11/2013   Procedure: TRANSESOPHAGEAL ECHOCARDIOGRAM (TEE);  Surgeon: Aleene JINNY Passe, MD;  Location: [facility] ENDOSCOPY;  Service: Cardiovascular;  Laterality: N/A;   TEE WITHOUT CARDIOVERSION N/A 07/26/2013   Procedure: TRANSESOPHAGEAL ECHOCARDIOGRAM (TEE);  Surgeon: Vina LULLA Gull, MD;  Location: [facility] ENDOSCOPY;  Service: Cardiovascular;  Laterality: N/A;   TUBAL LIGATION  10/04/1971    Family History  Problem Relation Age of Onset   Heart disease Brother 36       MI   Rheum arthritis Maternal Grandmother    Asthma Maternal Grandfather    Hypothyroidism Mother    Diabetes Sister    Cirrhosis Father      Social Connections: Unknown (07/21/2024)   Social Connection and Isolation Panel    Frequency of Communication with Friends and Family: Patient declined    Frequency of Social Gatherings with Friends and Family: Patient declined    Attends Religious Services: Not on Environmental Health Practitioner or Organizations: Not on file    Attends Banker Meetings: Not on file    Marital Status: Not on file      Current Outpatient Medications:    acetaminophen  (TYLENOL ) 325 MG tablet, Take 2 tablets (650 mg total) by mouth every 6 (six) hours as needed. (Patient taking differently: Take 650 mg by mouth every 6 (six) hours as needed for mild pain (pain score 1-3).), Disp: 36 tablet, Rfl: 0   albuterol  (VENTOLIN  HFA) 108 (90 Base) MCG/ACT inhaler, Inhale 2 puffs into the lungs every 6 (six) hours as needed for wheezing or shortness of breath., Disp: , Rfl:    amiodarone  (PACERONE ) 200 MG tablet, Take 1 tablet (200 mg total) by mouth daily. (Patient taking differently: Take 200 mg by mouth in the morning.), Disp: 30 tablet, Rfl: 6   apixaban  (ELIQUIS ) 5 MG TABS tablet, Take 1 tablet (5 mg total) by mouth 2 (two) times daily., Disp: 60 tablet, Rfl: 1   atorvastatin  (LIPITOR) 40 MG tablet, Take 1 tablet (40 mg total) by mouth daily., Disp: 90 tablet, Rfl: 3   azelastine  (ASTELIN ) 0.1 % nasal spray, Place 1 spray into both nostrils 2 (two) times daily. Use in each nostril as directed (Patient taking differently: Place 1 spray into both  nostrils daily as needed for allergies. Use in each nostril as directed), Disp: 30 mL, Rfl: 1   cefdinir  (OMNICEF ) 300 MG capsule, Take 300 mg by mouth 2 (two) times daily. (Patient not taking: Reported on 09/01/2024), Disp: , Rfl:    escitalopram  (LEXAPRO ) 10 MG tablet, Take 10 mg by mouth daily with breakfast., Disp: , Rfl:    feeding supplement (ENSURE PLUS HIGH PROTEIN) LIQD, Take 237 mLs by mouth 2 (two) times daily between meals., Disp: 14220 mL, Rfl: 0   levothyroxine  (SYNTHROID ) 100 MCG tablet, Take 100 mcg by mouth daily before breakfast., Disp: , Rfl:    meclizine  (ANTIVERT ) 25 MG tablet, Take 1 tablet (25 mg total) by mouth every 6 (six) hours as needed for dizziness., Disp: 90 tablet, Rfl: 1   metFORMIN  (GLUCOPHAGE ) 500 MG  tablet, Take 1 tablet (500 mg total) by mouth 2 (two) times daily with a meal., Disp: 60 tablet, Rfl: 1   Multiple Vitamin (MULTIVITAMIN WITH MINERALS) TABS tablet, Take 1 tablet by mouth in the morning., Disp: , Rfl:    ondansetron  (ZOFRAN -ODT) 4 MG disintegrating tablet, Take 4 mg by mouth every 6 (six) hours as needed for nausea or vomiting., Disp: , Rfl:    pantoprazole  (PROTONIX ) 40 MG tablet, TAKE 1 TABLET 30 TO 60 MINUTES BEFORE FIRST MEAL OF THE DAY. (Patient taking differently: Take 40 mg by mouth daily before breakfast.), Disp: 90 tablet, Rfl: 2   Polyethyl Glycol-Propyl Glycol (SYSTANE OP), Place 1 drop into both eyes daily as needed (dry eyes)., Disp: , Rfl:    potassium chloride  SA (KLOR-CON  M) 20 MEQ tablet, Take 1 tablet (20 mEq total) by mouth daily. (Patient taking differently: Take 20 mEq by mouth in the morning.), Disp: 30 tablet, Rfl: 0   torsemide  (DEMADEX ) 20 MG tablet, Take 1 tablet (20 mg total) by mouth daily., Disp: 30 tablet, Rfl: 0   TRELEGY ELLIPTA  100-62.5-25 MCG/ACT AEPB, Inhale 1 puff into the lungs daily., Disp: , Rfl:    Physical Exam:   BP 100/60 (BP Location: Left Arm, Patient Position: Sitting, Cuff Size: Large)   Pulse 82   Ht 5' 2.5 (1.588 m)   Wt 148 lb (67.1 kg)   SpO2 (!) 88%   BMI 26.64 kg/m   Salient findings:  CN II-XII intact Bilateral EAC clear and TM intact with well pneumatized middle ear spaces Anterior rhinoscopy: Septum intact; bilateral inferior turbinates without significant hypertrophy Midface stable, no significant enophthalmos or hypoglobus; EOM intact Intraoral and chin laceration well healed No midface stepoffs except for orbital rim stepoff AS which is mild; no mandibular mobility suggestive of fracture No lesions of oral cavity/oropharynx No obviously palpable neck masses/lymphadenopathy/thyromegaly No respiratory distress or stridor  Seprately Identifiable Procedures:  Prior to initiating any procedures,  risks/benefits/alternatives were explained to the patient and verbal consent obtained. None today  Impression & Plans:  [name] is a 78 y.o. female with:  1. Closed fracture of left orbital floor with routine healing, subsequent encounter   2. Closed fracture of nasal bone, sequela   3. Facial laceration, subsequent encounter    Facial laceration well healed; no evidence of nasal obstruction or hypoglobus or enophthalmos or diplopia. We discussed options including orbital floor repair but given lack of sx, she opted to observe  Return precautions discussed, f/u PRN  See below regarding exact medications prescribed this encounter including dosages and route: No orders of the defined types were placed in this encounter.     Thank  you for allowing me the opportunity to care for your patient. Please do not hesitate to contact me should you have any other questions.  Sincerely, Eldora Blanch, MD Otolaryngologist (ENT), [facility] Phone: [phone] Fax: [phone]  09/07/2024, 10:47 AM   MDM:  I have personally spent 31 minutes involved in face-to-face and non-face-to-face activities for this patient on the day of the visit.  Professional time spent excludes any procedures performed but includes the following activities, in addition to those noted in the documentation: preparing to see the patient (review of outside documentation and results), performing a medically appropriate examination, counseling, documenting in the electronic health record

## 2024-09-09 ENCOUNTER — Encounter: Payer: Self-pay | Admitting: Neurology

## 2024-09-11 ENCOUNTER — Encounter (HOSPITAL_COMMUNITY): Payer: Self-pay

## 2024-09-11 ENCOUNTER — Emergency Department (HOSPITAL_COMMUNITY)

## 2024-09-11 ENCOUNTER — Inpatient Hospital Stay (HOSPITAL_COMMUNITY)
Admission: EM | Admit: 2024-09-11 | Discharge: 2024-09-17 | DRG: 193 | Disposition: A | Attending: Internal Medicine | Admitting: Internal Medicine

## 2024-09-11 ENCOUNTER — Other Ambulatory Visit: Payer: Self-pay

## 2024-09-11 DIAGNOSIS — J440 Chronic obstructive pulmonary disease with acute lower respiratory infection: Secondary | ICD-10-CM | POA: Diagnosis present

## 2024-09-11 DIAGNOSIS — R197 Diarrhea, unspecified: Secondary | ICD-10-CM

## 2024-09-11 DIAGNOSIS — I099 Rheumatic heart disease, unspecified: Secondary | ICD-10-CM | POA: Diagnosis not present

## 2024-09-11 DIAGNOSIS — J441 Chronic obstructive pulmonary disease with (acute) exacerbation: Secondary | ICD-10-CM | POA: Diagnosis present

## 2024-09-11 DIAGNOSIS — R112 Nausea with vomiting, unspecified: Secondary | ICD-10-CM | POA: Diagnosis not present

## 2024-09-11 DIAGNOSIS — I130 Hypertensive heart and chronic kidney disease with heart failure and stage 1 through stage 4 chronic kidney disease, or unspecified chronic kidney disease: Secondary | ICD-10-CM | POA: Diagnosis present

## 2024-09-11 DIAGNOSIS — E782 Mixed hyperlipidemia: Secondary | ICD-10-CM | POA: Diagnosis present

## 2024-09-11 DIAGNOSIS — I4892 Unspecified atrial flutter: Secondary | ICD-10-CM | POA: Diagnosis present

## 2024-09-11 DIAGNOSIS — J189 Pneumonia, unspecified organism: Secondary | ICD-10-CM | POA: Diagnosis not present

## 2024-09-11 DIAGNOSIS — J9601 Acute respiratory failure with hypoxia: Principal | ICD-10-CM | POA: Diagnosis present

## 2024-09-11 DIAGNOSIS — R7989 Other specified abnormal findings of blood chemistry: Secondary | ICD-10-CM | POA: Diagnosis not present

## 2024-09-11 DIAGNOSIS — I480 Paroxysmal atrial fibrillation: Secondary | ICD-10-CM | POA: Diagnosis present

## 2024-09-11 DIAGNOSIS — I5033 Acute on chronic diastolic (congestive) heart failure: Secondary | ICD-10-CM | POA: Diagnosis present

## 2024-09-11 DIAGNOSIS — I2489 Other forms of acute ischemic heart disease: Secondary | ICD-10-CM | POA: Diagnosis present

## 2024-09-11 DIAGNOSIS — K219 Gastro-esophageal reflux disease without esophagitis: Secondary | ICD-10-CM | POA: Diagnosis present

## 2024-09-11 DIAGNOSIS — N1832 Chronic kidney disease, stage 3b: Secondary | ICD-10-CM | POA: Diagnosis present

## 2024-09-11 DIAGNOSIS — Z66 Do not resuscitate: Secondary | ICD-10-CM | POA: Diagnosis present

## 2024-09-11 DIAGNOSIS — E876 Hypokalemia: Secondary | ICD-10-CM | POA: Diagnosis present

## 2024-09-11 DIAGNOSIS — Z1152 Encounter for screening for COVID-19: Secondary | ICD-10-CM | POA: Diagnosis not present

## 2024-09-11 DIAGNOSIS — I4819 Other persistent atrial fibrillation: Secondary | ICD-10-CM | POA: Diagnosis not present

## 2024-09-11 DIAGNOSIS — E039 Hypothyroidism, unspecified: Secondary | ICD-10-CM | POA: Diagnosis present

## 2024-09-11 DIAGNOSIS — I4891 Unspecified atrial fibrillation: Secondary | ICD-10-CM | POA: Diagnosis present

## 2024-09-11 DIAGNOSIS — E1143 Type 2 diabetes mellitus with diabetic autonomic (poly)neuropathy: Secondary | ICD-10-CM | POA: Diagnosis present

## 2024-09-11 DIAGNOSIS — N179 Acute kidney failure, unspecified: Secondary | ICD-10-CM | POA: Diagnosis not present

## 2024-09-11 DIAGNOSIS — E1165 Type 2 diabetes mellitus with hyperglycemia: Secondary | ICD-10-CM | POA: Diagnosis present

## 2024-09-11 DIAGNOSIS — F39 Unspecified mood [affective] disorder: Secondary | ICD-10-CM | POA: Diagnosis present

## 2024-09-11 DIAGNOSIS — Z953 Presence of xenogenic heart valve: Secondary | ICD-10-CM | POA: Diagnosis not present

## 2024-09-11 DIAGNOSIS — I700 Atherosclerosis of aorta: Secondary | ICD-10-CM | POA: Diagnosis present

## 2024-09-11 DIAGNOSIS — E1122 Type 2 diabetes mellitus with diabetic chronic kidney disease: Secondary | ICD-10-CM | POA: Diagnosis present

## 2024-09-11 DIAGNOSIS — Z7901 Long term (current) use of anticoagulants: Secondary | ICD-10-CM | POA: Diagnosis not present

## 2024-09-11 LAB — RESP PANEL BY RT-PCR (RSV, FLU A&B, COVID)  RVPGX2
Influenza A by PCR: NEGATIVE
Influenza B by PCR: NEGATIVE
Resp Syncytial Virus by PCR: NEGATIVE
SARS Coronavirus 2 by RT PCR: NEGATIVE

## 2024-09-11 LAB — COMPREHENSIVE METABOLIC PANEL WITH GFR
ALT: 32 U/L (ref 0–44)
AST: 44 U/L — ABNORMAL HIGH (ref 15–41)
Albumin: 3.9 g/dL (ref 3.5–5.0)
Alkaline Phosphatase: 90 U/L (ref 38–126)
Anion gap: 19 — ABNORMAL HIGH (ref 5–15)
BUN: 16 mg/dL (ref 8–23)
CO2: 22 mmol/L (ref 22–32)
Calcium: 8.4 mg/dL — ABNORMAL LOW (ref 8.9–10.3)
Chloride: 91 mmol/L — ABNORMAL LOW (ref 98–111)
Creatinine, Ser: 1.32 mg/dL — ABNORMAL HIGH (ref 0.44–1.00)
GFR, Estimated: 41 mL/min — ABNORMAL LOW (ref >60)
Glucose, Bld: 132 mg/dL — ABNORMAL HIGH (ref 70–99)
Potassium: 3.5 mmol/L (ref 3.5–5.1)
Sodium: 132 mmol/L — ABNORMAL LOW (ref 135–145)
Total Bilirubin: 0.8 mg/dL (ref 0.0–1.2)
Total Protein: 8.7 g/dL — ABNORMAL HIGH (ref 6.5–8.1)

## 2024-09-11 LAB — CBC WITH DIFFERENTIAL/PLATELET
Abs Immature Granulocytes: 0.05 K/uL (ref 0.00–0.07)
Basophils Absolute: 0.1 K/uL (ref 0.0–0.1)
Basophils Relative: 1 %
Eosinophils Absolute: 0 K/uL (ref 0.0–0.5)
Eosinophils Relative: 0 %
HCT: 36.8 % (ref 36.0–46.0)
Hemoglobin: 11.3 g/dL — ABNORMAL LOW (ref 12.0–15.0)
Immature Granulocytes: 1 %
Lymphocytes Relative: 7 %
Lymphs Abs: 0.5 K/uL — ABNORMAL LOW (ref 0.7–4.0)
MCH: 26.8 pg (ref 26.0–34.0)
MCHC: 30.7 g/dL (ref 30.0–36.0)
MCV: 87.4 fL (ref 80.0–100.0)
Monocytes Absolute: 0.5 K/uL (ref 0.1–1.0)
Monocytes Relative: 7 %
Neutro Abs: 6 K/uL (ref 1.7–7.7)
Neutrophils Relative %: 84 %
Platelets: 175 K/uL (ref 150–400)
RBC: 4.21 MIL/uL (ref 3.87–5.11)
RDW: 17 % — ABNORMAL HIGH (ref 11.5–15.5)
WBC: 7.1 K/uL (ref 4.0–10.5)
nRBC: 0 % (ref 0.0–0.2)

## 2024-09-11 LAB — URINALYSIS, ROUTINE W REFLEX MICROSCOPIC
Bilirubin Urine: NEGATIVE
Glucose, UA: NEGATIVE mg/dL
Ketones, ur: NEGATIVE mg/dL
Nitrite: NEGATIVE
Protein, ur: 30 mg/dL — AB
RBC / HPF: >50 RBC/hpf (ref 0–5)
Specific Gravity, Urine: 1.026 (ref 1.005–1.030)
WBC, UA: >50 WBC/hpf (ref 0–5)
pH: 7 (ref 5.0–8.0)

## 2024-09-11 LAB — TROPONIN T, HIGH SENSITIVITY
Troponin T High Sensitivity: 31 ng/L — ABNORMAL HIGH (ref 0–19)
Troponin T High Sensitivity: 35 ng/L — ABNORMAL HIGH (ref 0–19)

## 2024-09-11 LAB — MAGNESIUM: Magnesium: 1.3 mg/dL — ABNORMAL LOW (ref 1.7–2.4)

## 2024-09-11 LAB — PRO BRAIN NATRIURETIC PEPTIDE: Pro Brain Natriuretic Peptide: 5666 pg/mL — ABNORMAL HIGH (ref <300.0)

## 2024-09-11 LAB — CBG MONITORING, ED
Glucose-Capillary: 136 mg/dL — ABNORMAL HIGH (ref 70–99)
Glucose-Capillary: 125 mg/dL — ABNORMAL HIGH (ref 70–99)

## 2024-09-11 LAB — LIPASE, BLOOD: Lipase: 35 U/L (ref 11–51)

## 2024-09-11 MED ORDER — SODIUM CHLORIDE 0.9 % IV SOLN
100.0000 mg | Freq: Once | INTRAVENOUS | Status: AC
  Administered 2024-09-11: 100 mg via INTRAVENOUS
  Filled 2024-09-11: qty 100

## 2024-09-11 MED ORDER — ACETAMINOPHEN 325 MG PO TABS
650.0000 mg | ORAL_TABLET | Freq: Four times a day (QID) | ORAL | Status: DC | PRN
  Administered 2024-09-12: 650 mg via ORAL
  Filled 2024-09-11: qty 2

## 2024-09-11 MED ORDER — ONDANSETRON HCL 4 MG/2ML IJ SOLN
4.0000 mg | Freq: Four times a day (QID) | INTRAMUSCULAR | Status: DC | PRN
  Administered 2024-09-12: 4 mg via INTRAVENOUS
  Filled 2024-09-11: qty 2

## 2024-09-11 MED ORDER — ONDANSETRON HCL 4 MG PO TABS
4.0000 mg | ORAL_TABLET | Freq: Four times a day (QID) | ORAL | Status: DC | PRN
  Administered 2024-09-15 – 2024-09-16 (×3): 4 mg via ORAL
  Filled 2024-09-11 (×3): qty 1

## 2024-09-11 MED ORDER — ENOXAPARIN SODIUM 40 MG/0.4ML IJ SOSY: 40.0000 mg | PREFILLED_SYRINGE | INTRAMUSCULAR | Status: DC

## 2024-09-11 MED ORDER — IOHEXOL 300 MG/ML  SOLN
80.0000 mL | Freq: Once | INTRAMUSCULAR | Status: AC | PRN
  Administered 2024-09-11: 80 mL via INTRAVENOUS

## 2024-09-11 MED ORDER — ACETAMINOPHEN 650 MG RE SUPP: 650.0000 mg | Freq: Four times a day (QID) | RECTAL | Status: DC | PRN

## 2024-09-11 MED ORDER — LACTATED RINGERS IV BOLUS
1000.0000 mL | Freq: Once | INTRAVENOUS | Status: AC
  Administered 2024-09-11: 1000 mL via INTRAVENOUS

## 2024-09-11 MED ORDER — SODIUM CHLORIDE 0.9 % IV SOLN
1.0000 g | Freq: Once | INTRAVENOUS | Status: AC
  Administered 2024-09-11: 1 g via INTRAVENOUS
  Filled 2024-09-11: qty 10

## 2024-09-11 MED ORDER — ONDANSETRON HCL 4 MG/2ML IJ SOLN
4.0000 mg | Freq: Once | INTRAMUSCULAR | Status: AC
  Administered 2024-09-11: 4 mg via INTRAVENOUS
  Filled 2024-09-11: qty 2

## 2024-09-11 NOTE — ED Notes (Signed)
 Patient transported to CT

## 2024-09-11 NOTE — H&P (Addendum)
 History and Physical    Patient: [name] FMW:[id number] DOB: 1946/09/29 DOA: 09/11/2024 DOS: the patient was seen and examined on 09/11/2024 PCP: Sheryle Carwin, MD  Patient coming from: Home  Chief Complaint:  Chief Complaint  Patient presents with   Weakness   Emesis   HPI: [name] is a 78 y.o. female with medical history significant of  HFpEF, A-fib/flutter on Eliquis , rheumatic heart disease, AV and MV replacement, CVA, carotid artery stenosis, CKD-3B, lumbar laminectomy and compression and mild cognitive impairment, hypothyroidism, T2DM, COPD who presents to the emergency department due to nonbloody, but bilious vomiting which started this morning.  She says she was feeling unwell on waking up this morning and she had several episodes of vomiting, she also complained of weakness and diarrhea without blood, but denies chest pain, shortness of breath, cough, sore throat.  ED course In the emergency department, BP was 119/48, O2 sat was 77% on room air, this improved to 90s with oxygen  at 2 LPM.  Workup in ED showed magnesium  1.3, proBNP 5,666, troponin 31 > 35. Chest x-ray was suggestive of right midlung zone airspace opacity She was treated with IV ceftriaxone  and doxycycline  due to presumed CAP.  IV hydration was provided.   Review of Systems: As mentioned in the history of present illness. All other systems reviewed and are negative. Past Medical History:  Diagnosis Date   Anemia    Asthma 2010   Atrial fibrillation (HCC)    Atrial flutter (HCC)    Breast carcinoma (HCC) 1995   1995   Cardioembolic stroke (HCC) 01/2012   Right frontal in 01/2012; normal carotid ultrasound; possible LAA thrombus by TEE; virtual complete neurologic recovery   Carotid artery stenosis 1961   Chronic kidney disease, stage 2, mildly decreased GFR    GFR of approximately 60   Diabetes mellitus without complication (HCC) 5+ years ago   Controlled most of time   Diverticulosis of colon  (without mention of hemorrhage) 2012   Dr. Golda   Fasting hyperglycemia    120 fasting   Gastroesophageal reflux disease    Gastroparesis    Heart failure with improved ejection fraction (HFimpEF) (HCC) 5+ years   Heart murmur 1961   Hemorrhoids    History of aortic valve replacement with bioprosthetic valve    History of mitral valve replacement with bioprosthetic valve    Hyperlipidemia    Hypertension    pt denies 05/30/13     Dr Alvan chester   Hyponatremia    Hypothyroidism    Rheumatic heart disease    Shortness of breath    Syncope and collapse February, 2024   Past Surgical History:  Procedure Laterality Date   AORTIC VALVE REPLACEMENT N/A 07/18/2013   Procedure: AORTIC VALVE REPLACEMENT (AVR);  Surgeon: Dorise MARLA Fellers, MD;  Location: [facility] OR;  Service: Open Heart Surgery;  Laterality: N/A;   BACK SURGERY     BREAST LUMPECTOMY Right 10/03/1993   CARDIAC CATHETERIZATION  5+ years   CARDIOVERSION N/A 07/26/2013   Procedure: CARDIOVERSION;  Surgeon: Vina LULLA Gull, MD;  Location: [facility] ENDOSCOPY;  Service: Cardiovascular;  Laterality: N/A;   CARDIOVERSION N/A 01/04/2023   Procedure: CARDIOVERSION;  Surgeon: Hobart Powell BRAVO, MD;  Location: [facility] ENDOSCOPY;  Service: Cardiovascular;  Laterality: N/A;   CARDIOVERSION N/A 02/23/2024   Procedure: CARDIOVERSION;  Surgeon: [name], MD;  Location: [facility] ORS;  Service: Endoscopy;  Laterality: N/A;   COLONOSCOPY  10/03/2010   Negative screening  procedure   DILATION AND CURETTAGE OF UTERUS     ESOPHAGEAL MANOMETRY N/A 06/17/2013   Procedure: ESOPHAGEAL MANOMETRY (EM);  Surgeon: Alm JONELLE Gander, MD;  Location: [facility] ENDOSCOPY;  Service: Endoscopy;  Laterality: N/A;   INTRAOPERATIVE TRANSESOPHAGEAL ECHOCARDIOGRAM N/A 07/18/2013   Procedure: INTRAOPERATIVE TRANSESOPHAGEAL ECHOCARDIOGRAM;  Surgeon: Dorise MARLA Fellers, MD;  Location: [facility] OR;  Service: Open Heart Surgery;  Laterality: N/A;   LEFT AND RIGHT HEART CATHETERIZATION WITH  CORONARY ANGIOGRAM N/A 07/10/2013   Procedure: LEFT AND RIGHT HEART CATHETERIZATION WITH CORONARY ANGIOGRAM;  Surgeon: Lonni JONETTA Cash, MD;  Location: [facility] CATH LAB;  Service: Cardiovascular;  Laterality: N/A;   LOOP RECORDER INSERTION N/A 07/02/2024   Procedure: LOOP RECORDER INSERTION;  Surgeon: Nancey Eulas BRAVO, MD;  Location: [facility] INVASIVE CV LAB;  Service: Cardiovascular;  Laterality: N/A;   LUMBAR LAMINECTOMY/DECOMPRESSION MICRODISCECTOMY Right 05/13/2014   Procedure: LUMBAR LAMINECTOMY/DECOMPRESSION MICRODISCECTOMY 1 LEVEL  lumbar four/five;  Surgeon: Darina MALVA Boehringer, MD;  Location: [facility] NEURO ORS;  Service: Neurosurgery;  Laterality: Right;   MITRAL VALVE REPLACEMENT N/A 07/18/2013   Procedure: MITRAL VALVE (MV) REPLACEMENT;  Surgeon: Dorise MARLA Fellers, MD;  Location: [facility] OR;  Service: Open Heart Surgery;  Laterality: N/A;   MITRAL VALVE SURGERY  10/03/1960   [facility], closed mitral valvulotomy by finger fracture   TEE WITHOUT CARDIOVERSION  01/24/2012   Procedure: TRANSESOPHAGEAL ECHOCARDIOGRAM (TEE);  Surgeon: Redell GORMAN Shallow, MD;  Location: [facility] ENDOSCOPY;  Service: Cardiovascular;  Laterality: N/A;   TEE WITHOUT CARDIOVERSION N/A 07/11/2013   Procedure: TRANSESOPHAGEAL ECHOCARDIOGRAM (TEE);  Surgeon: Aleene JINNY Passe, MD;  Location: [facility] ENDOSCOPY;  Service: Cardiovascular;  Laterality: N/A;   TEE WITHOUT CARDIOVERSION N/A 07/26/2013   Procedure: TRANSESOPHAGEAL ECHOCARDIOGRAM (TEE);  Surgeon: Vina LULLA Gull, MD;  Location: [facility] ENDOSCOPY;  Service: Cardiovascular;  Laterality: N/A;   TUBAL LIGATION  10/04/1971   Social History:  reports that she has never smoked. She has never used smokeless tobacco. She reports that she does not drink alcohol and does not use drugs.  No Known Allergies  Family History  Problem Relation Age of Onset   Heart disease Brother 76       MI   Rheum arthritis Maternal Grandmother    Asthma Maternal Grandfather    Hypothyroidism Mother    Diabetes Sister     Cirrhosis Father     Prior to Admission medications   Medication Sig Start Date End Date Taking? Authorizing Provider  acetaminophen  (TYLENOL ) 325 MG tablet Take 2 tablets (650 mg total) by mouth every 6 (six) hours as needed. Patient taking differently: Take 650 mg by mouth every 6 (six) hours as needed for mild pain (pain score 1-3). 01/20/24   [name], DO  albuterol  (VENTOLIN  HFA) 108 (90 Base) MCG/ACT inhaler Inhale 2 puffs into the lungs every 6 (six) hours as needed for wheezing or shortness of breath. 08/17/23   [provider]  amiodarone  (PACERONE ) 200 MG tablet Take 1 tablet (200 mg total) by mouth daily. Patient taking differently: Take 200 mg by mouth in the morning. 02/02/24   Mealor, Eulas BRAVO, MD  apixaban  (ELIQUIS ) 5 MG TABS tablet Take 1 tablet (5 mg total) by mouth 2 (two) times daily. 07/26/24   Shahmehdi, [name], MD  atorvastatin  (LIPITOR) 40 MG tablet Take 1 tablet (40 mg total) by mouth daily. 07/05/24   [name], MD  azelastine  (ASTELIN ) 0.1 % nasal spray Place 1 spray into both nostrils 2 (two) times daily. Use in  each nostril as directed Patient taking differently: Place 1 spray into both nostrils daily as needed for allergies. Use in each nostril as directed 04/30/24   [name], NP  cefdinir  (OMNICEF ) 300 MG capsule Take 300 mg by mouth 2 (two) times daily. Patient not taking: Reported on 09/01/2024 08/13/24   [provider]  cephALEXin  (KEFLEX ) 500 MG capsule Take 500 mg by mouth 2 (two) times daily. 09/04/24   [provider]  escitalopram  (LEXAPRO ) 10 MG tablet Take 10 mg by mouth daily with breakfast. 04/10/15   [provider]  feeding supplement (ENSURE PLUS HIGH PROTEIN) LIQD Take 237 mLs by mouth 2 (two) times daily between meals. 07/23/24   Shahmehdi, [name], MD  levothyroxine  (SYNTHROID ) 100 MCG tablet Take 100 mcg by mouth daily before breakfast.    [provider]  meclizine  (ANTIVERT ) 25 MG  tablet Take 1 tablet (25 mg total) by mouth every 6 (six) hours as needed for dizziness. 08/15/24   [name], MD  metFORMIN  (GLUCOPHAGE ) 500 MG tablet Take 1 tablet (500 mg total) by mouth 2 (two) times daily with a meal. 08/03/13   Collins, Tillman CROME, PA-C  Multiple Vitamin (MULTIVITAMIN WITH MINERALS) TABS tablet Take 1 tablet by mouth in the morning.    [provider]  ondansetron  (ZOFRAN -ODT) 4 MG disintegrating tablet Take 4 mg by mouth every 6 (six) hours as needed for nausea or vomiting. 07/18/24   [provider]  pantoprazole  (PROTONIX ) 40 MG tablet TAKE 1 TABLET 30 TO 60 MINUTES BEFORE FIRST MEAL OF THE DAY. Patient taking differently: Take 40 mg by mouth daily before breakfast. 03/15/24   Darlean Ozell NOVAK, MD  Polyethyl Glycol-Propyl Glycol (SYSTANE OP) Place 1 drop into both eyes daily as needed (dry eyes).    [provider]  potassium chloride  SA (KLOR-CON  M) 20 MEQ tablet Take 1 tablet (20 mEq total) by mouth daily. Patient taking differently: Take 20 mEq by mouth in the morning. 11/14/22 09/01/24  Rai, Nydia POUR, MD  torsemide  (DEMADEX ) 20 MG tablet Take 1 tablet (20 mg total) by mouth daily. 07/25/24 09/01/24  [name], MD  TRELEGY ELLIPTA  100-62.5-25 MCG/ACT AEPB Inhale 1 puff into the lungs daily. 06/25/24   [provider]    Physical Exam: Vitals:   09/11/24 1510 09/11/24 1600 09/11/24 1900 09/11/24 2000  BP:  (!) 119/48 (!) 132/57   Pulse:  67 65 64  Resp:  19 14 17   Temp:      TempSrc:      SpO2:  (!) 77% 94% 96%  Weight: 67.1 kg     Height: 5' 2.25 (1.581 m)      General: Elderly female. Awake and alert and oriented x3. Not in any acute distress.  HEENT: NCAT.  PERRLA. EOMI. Sclerae anicteric.  Moist mucosal membranes. Neck: Neck supple without lymphadenopathy. No carotid bruits. No masses palpated.  Cardiovascular: Regular rate with normal S1-S2 sounds. No murmurs, rubs or gallops auscultated. No JVD.   Respiratory: Clear breath sounds.  No accessory muscle use. Abdomen: Soft, tender to palpation of abdomen, worse in lower quadrants, nondistended. Active bowel sounds. No masses or hepatosplenomegaly  Skin: No rashes, lesions, or ulcerations.  Dry, warm to touch. Musculoskeletal:  2+ dorsalis pedis and radial pulses. Good ROM.  No contractures  Psychiatric: Intact judgment and insight.  Mood appropriate to current condition. Neurologic: No focal neurological deficits. Strength is 5/5 x 4.  CN II - XII grossly intact.  Assessment and Plan: Acute respiratory failure due to presumed CAP POA Patient was started on ceftriaxone  and doxycycline , we shall continue same at this time with plan to de-escalate/discontinue based on blood culture, sputum culture, urine Legionella, strep pneumo and procalcitonin Continue Tylenol  as needed Continue Mucinex , incentive spirometry, flutter valve  Continue supplemental oxygen  to maintain O2 sats > 92% with plan to wean patient off this as tolerated.  Patient does not use oxygen  at baseline  Nausea and vomiting Continue Zofran  as needed  Acute diarrhea Patient has not had any diarrhea since arrival to the ED Continue to monitor for diarrhea and consider C.Diff and GI stool panel  Hypomagnesemia Magnesium  1.3, this will be replenished  Elevated proBNP proBNP 5,666 Patient with history of chronic HFpEF Echocardiogram done on 07/22/2024 showed LVEF of 60 to 65%, normal RWMA.  Indeterminate LV diastolic function Continue total input/output, daily weights and fluid restriction Torsemide  and bisoprolol  temporarily held at this time due to soft BP  Elevated troponin possibly due to type II demand ischemia Troponin 31 > 35, patient denies chest pain, continue to trend troponin  Paroxysmal A-fib/flutter Continue amiodarone , Eliquis  Bisoprolol  temporarily held due to soft BP  Essential hypertension  Hold torsemide  due to soft BP   Acquired  hypothyroidism Continue home Synthroid    GERD Continue PPI   Mixed hyperlipidemia Continue Lipitor  Type 2 diabetes mellitus with hyperglycemia Continue ISS and hypoglycemia protocol Metformin  will be held at this time   Mood disorder Continue Lexapro .   Advance Care Planning: Full code  Consults: None  Family Communication: None at bedside  Severity of Illness: The appropriate patient status for this patient is INPATIENT. Inpatient status is judged to be reasonable and necessary in order to provide the required intensity of service to ensure the patient's safety. The patient's presenting symptoms, physical exam findings, and initial radiographic and laboratory data in the context of their chronic comorbidities is felt to place them at high risk for further clinical deterioration. Furthermore, it is not anticipated that the patient will be medically stable for discharge from the hospital within 2 midnights of admission.   * I certify that at the point of admission it is my clinical judgment that the patient will require inpatient hospital care spanning beyond 2 midnights from the point of admission due to high intensity of service, high risk for further deterioration and high frequency of surveillance required.*  Author: Demitris Pokorny, DO 09/11/2024 8:53 PM  For on call review [email or web address].

## 2024-09-11 NOTE — ED Notes (Signed)
 PO challenge passed

## 2024-09-11 NOTE — ED Triage Notes (Addendum)
 Patient BIB [facility] from home for weakness, and vomiting bile fluid that started this morning. 98.2 oral temp, BP 136/78, CBG 173. Denise pain. No intake today.

## 2024-09-11 NOTE — ED Notes (Signed)
 Provided pt with water and saltines for PO challenge. Will reassess.

## 2024-09-11 NOTE — ED Provider Notes (Signed)
 [place] EMERGENCY DEPARTMENT AT [facility] Provider Note   CSN: [id number] Arrival date & time: 09/11/24  1452     Patient presents with: Weakness and Emesis   [name] is a 78 y.o. female.   HPI 78 year old female presents with vomiting.  She states that yesterday she felt fine but earlier this morning when she woke up she was feeling poorly and then started having multiple episodes of green emesis.  She has also had diarrhea without blood.  Denies any abdominal pain but feels overall weak.  No urinary symptoms, chest pain, shortness of breath, cough or sore throat. No recent antibiotics, travel or recent contact with sick contacts.  Prior to Admission medications   Medication Sig Start Date End Date Taking? Authorizing Provider  acetaminophen  (TYLENOL ) 325 MG tablet Take 2 tablets (650 mg total) by mouth every 6 (six) hours as needed. Patient taking differently: Take 650 mg by mouth every 6 (six) hours as needed for mild pain (pain score 1-3). 01/20/24   [name], DO  albuterol  (VENTOLIN  HFA) 108 (90 Base) MCG/ACT inhaler Inhale 2 puffs into the lungs every 6 (six) hours as needed for wheezing or shortness of breath. 08/17/23   [provider]  amiodarone  (PACERONE ) 200 MG tablet Take 1 tablet (200 mg total) by mouth daily. Patient taking differently: Take 200 mg by mouth in the morning. 02/02/24   Mealor, [name], MD  apixaban  (ELIQUIS ) 5 MG TABS tablet Take 1 tablet (5 mg total) by mouth 2 (two) times daily. 07/26/24   Shahmehdi, [name], MD  atorvastatin  (LIPITOR) 40 MG tablet Take 1 tablet (40 mg total) by mouth daily. 07/05/24   [name], MD  azelastine  (ASTELIN ) 0.1 % nasal spray Place 1 spray into both nostrils 2 (two) times daily. Use in each nostril as directed Patient taking differently: Place 1 spray into both nostrils daily as needed for allergies. Use in each nostril as directed 04/30/24   [name], NP  cefdinir   (OMNICEF ) 300 MG capsule Take 300 mg by mouth 2 (two) times daily. Patient not taking: Reported on 09/01/2024 08/13/24   [provider]  escitalopram  (LEXAPRO ) 10 MG tablet Take 10 mg by mouth daily with breakfast. 04/10/15   [provider]  feeding supplement (ENSURE PLUS HIGH PROTEIN) LIQD Take 237 mLs by mouth 2 (two) times daily between meals. 07/23/24   Shahmehdi, [name], MD  levothyroxine  (SYNTHROID ) 100 MCG tablet Take 100 mcg by mouth daily before breakfast.    [provider]  meclizine  (ANTIVERT ) 25 MG tablet Take 1 tablet (25 mg total) by mouth every 6 (six) hours as needed for dizziness. 08/15/24   [name], MD  metFORMIN  (GLUCOPHAGE ) 500 MG tablet Take 1 tablet (500 mg total) by mouth 2 (two) times daily with a meal. 08/03/13   Collins, Tillman CROME, PA-C  Multiple Vitamin (MULTIVITAMIN WITH MINERALS) TABS tablet Take 1 tablet by mouth in the morning.    [provider]  ondansetron  (ZOFRAN -ODT) 4 MG disintegrating tablet Take 4 mg by mouth every 6 (six) hours as needed for nausea or vomiting. 07/18/24   [provider]  pantoprazole  (PROTONIX ) 40 MG tablet TAKE 1 TABLET 30 TO 60 MINUTES BEFORE FIRST MEAL OF THE DAY. Patient taking differently: Take 40 mg by mouth daily before breakfast. 03/15/24   Darlean Ozell NOVAK, MD  Polyethyl Glycol-Propyl Glycol (SYSTANE OP) Place 1 drop into both eyes daily as needed (dry eyes).  [provider]  potassium chloride  SA (KLOR-CON  M) 20 MEQ tablet Take 1 tablet (20 mEq total) by mouth daily. Patient taking differently: Take 20 mEq by mouth in the morning. 11/14/22 09/01/24  Rai, Nydia POUR, MD  torsemide  (DEMADEX ) 20 MG tablet Take 1 tablet (20 mg total) by mouth daily. 07/25/24 09/01/24  [name], MD  TRELEGY ELLIPTA  100-62.5-25 MCG/ACT AEPB Inhale 1 puff into the lungs daily. 06/25/24   [provider]    Allergies: Patient has no known allergies.    Review of Systems   Constitutional:  Negative for fever.  Respiratory:  Negative for cough and shortness of breath.   Cardiovascular:  Negative for chest pain.  Gastrointestinal:  Positive for diarrhea, nausea and vomiting. Negative for blood in stool.    Updated Vital Signs BP (!) 111/52 (BP Location: Left Arm)   Pulse 68   Temp 98.3 F (36.8 C) (Oral)   Resp 15   Ht 5' 2.25 (1.581 m)   Wt 67.1 kg   SpO2 93%   BMI 26.84 kg/m   Physical Exam Vitals and nursing note reviewed.  Constitutional:      General: She is not in acute distress.    Appearance: She is well-developed. She is not ill-appearing or diaphoretic.  HENT:     Head: Normocephalic and atraumatic.  Cardiovascular:     Rate and Rhythm: Normal rate and regular rhythm.     Heart sounds: Murmur heard.  Pulmonary:     Effort: Pulmonary effort is normal.     Breath sounds: Normal breath sounds.  Abdominal:     Palpations: Abdomen is soft.     Tenderness: There is generalized abdominal tenderness (worst in the lower abdomen, overall mild).  Skin:    General: Skin is warm and dry.  Neurological:     Mental Status: She is alert.     (all labs ordered are listed, but only abnormal results are displayed) Labs Reviewed  COMPREHENSIVE METABOLIC PANEL WITH GFR  URINALYSIS, ROUTINE W REFLEX MICROSCOPIC  LIPASE, BLOOD  CBC WITH DIFFERENTIAL/PLATELET  CBG MONITORING, ED    EKG: None  Radiology: No results found.   Procedures   Medications Ordered in the ED  ondansetron  (ZOFRAN ) injection 4 mg (has no administration in time range)  lactated ringers  bolus 1,000 mL (has no administration in time range)                                    Medical Decision Making Amount and/or Complexity of Data Reviewed Labs: ordered.    Details: Normal WBC Radiology: ordered and independent interpretation performed.    Details: No bowel obstruction though poor quality.  Later chest x-ray shows some perhaps mild  edema/infiltrate ECG/medicine tests: ordered and independent interpretation performed.    Details: Prolonged QTc on 1 ECG and then seemingly better on the next  Risk Prescription drug management. Decision regarding hospitalization.   Patient presents with what sounds like acute GI illness.  Vomiting, diarrhea, abdominal pain.  Given a liter of fluids given her most recent EF is 60-65% without obvious diastolic dysfunction.  Was feeling somewhat better but still nauseated.  CT was obtained but does not show any obvious pathology such as diverticulitis but was open overall poor quality study.  However a couple hours into her stay she developed hypoxia to around 80%.  Put on oxygen  via nasal cannula with good response.  X-ray shows maybe a little bit of a wet pattern with some perhaps pneumonia.  Perhaps this is pneumonia with some GI symptoms and so she was given antibiotics.  I have also added on troponin and BNP in case this is CHF though clinically she does not appear to be this way and has only received 1 L of fluid.  Will need admission, discussed with Dr. Adefeso.     Final diagnoses:  Acute respiratory failure with hypoxia [facility])    ED Discharge Orders     None          Freddi Hamilton, MD 09/11/24 2338

## 2024-09-12 ENCOUNTER — Encounter

## 2024-09-12 LAB — HIV ANTIBODY (ROUTINE TESTING W REFLEX): HIV Screen 4th Generation wRfx: NONREACTIVE

## 2024-09-12 LAB — CBC
HCT: 38 % (ref 36.0–46.0)
Hemoglobin: 11.7 g/dL — ABNORMAL LOW (ref 12.0–15.0)
MCH: 27.3 pg (ref 26.0–34.0)
MCHC: 30.8 g/dL (ref 30.0–36.0)
MCV: 88.6 fL (ref 80.0–100.0)
Platelets: 177 K/uL (ref 150–400)
RBC: 4.29 MIL/uL (ref 3.87–5.11)
RDW: 16.7 % — ABNORMAL HIGH (ref 11.5–15.5)
WBC: 14.6 K/uL — ABNORMAL HIGH (ref 4.0–10.5)
nRBC: 0 % (ref 0.0–0.2)

## 2024-09-12 LAB — GLUCOSE, CAPILLARY
Glucose-Capillary: 108 mg/dL — ABNORMAL HIGH (ref 70–99)
Glucose-Capillary: 168 mg/dL — ABNORMAL HIGH (ref 70–99)
Glucose-Capillary: 125 mg/dL — ABNORMAL HIGH (ref 70–99)
Glucose-Capillary: 135 mg/dL — ABNORMAL HIGH (ref 70–99)

## 2024-09-12 LAB — PROCALCITONIN: Procalcitonin: <0.1 ng/mL

## 2024-09-12 LAB — STREP PNEUMONIAE URINARY ANTIGEN: Strep Pneumo Urinary Antigen: NEGATIVE

## 2024-09-12 LAB — BASIC METABOLIC PANEL WITH GFR
Anion gap: 10 (ref 5–15)
BUN: 14 mg/dL (ref 8–23)
CO2: 28 mmol/L (ref 22–32)
Calcium: 8.5 mg/dL — ABNORMAL LOW (ref 8.9–10.3)
Chloride: 94 mmol/L — ABNORMAL LOW (ref 98–111)
Creatinine, Ser: 1.24 mg/dL — ABNORMAL HIGH (ref 0.44–1.00)
GFR, Estimated: 44 mL/min — ABNORMAL LOW (ref >60)
Glucose, Bld: 112 mg/dL — ABNORMAL HIGH (ref 70–99)
Potassium: 3.8 mmol/L (ref 3.5–5.1)
Sodium: 133 mmol/L — ABNORMAL LOW (ref 135–145)

## 2024-09-12 LAB — COMPREHENSIVE METABOLIC PANEL WITH GFR
ALT: 27 U/L (ref 0–44)
AST: 34 U/L (ref 15–41)
Albumin: 3.7 g/dL (ref 3.5–5.0)
Alkaline Phosphatase: 87 U/L (ref 38–126)
Anion gap: 14 (ref 5–15)
BUN: 14 mg/dL (ref 8–23)
CO2: 27 mmol/L (ref 22–32)
Calcium: 8.2 mg/dL — ABNORMAL LOW (ref 8.9–10.3)
Chloride: 93 mmol/L — ABNORMAL LOW (ref 98–111)
Creatinine, Ser: 1.1 mg/dL — ABNORMAL HIGH (ref 0.44–1.00)
GFR, Estimated: 51 mL/min — ABNORMAL LOW (ref >60)
Glucose, Bld: 106 mg/dL — ABNORMAL HIGH (ref 70–99)
Potassium: 3.1 mmol/L — ABNORMAL LOW (ref 3.5–5.1)
Sodium: 135 mmol/L (ref 135–145)
Total Bilirubin: 0.8 mg/dL (ref 0.0–1.2)
Total Protein: 6.9 g/dL (ref 6.5–8.1)

## 2024-09-12 LAB — HEMOGLOBIN A1C
Hgb A1c MFr Bld: 6.3 % — ABNORMAL HIGH (ref 4.8–5.6)
Mean Plasma Glucose: 134.11 mg/dL

## 2024-09-12 LAB — MAGNESIUM
Magnesium: 1.5 mg/dL — ABNORMAL LOW (ref 1.7–2.4)
Magnesium: 1.6 mg/dL — ABNORMAL LOW (ref 1.7–2.4)

## 2024-09-12 LAB — TROPONIN T, HIGH SENSITIVITY
Troponin T High Sensitivity: 33 ng/L — ABNORMAL HIGH (ref 0–19)
Troponin T High Sensitivity: 39 ng/L — ABNORMAL HIGH (ref 0–19)

## 2024-09-12 LAB — PHOSPHORUS: Phosphorus: 3.4 mg/dL (ref 2.5–4.6)

## 2024-09-12 MED ORDER — ATORVASTATIN CALCIUM 40 MG PO TABS
40.0000 mg | ORAL_TABLET | Freq: Every day | ORAL | Status: DC
  Administered 2024-09-12 – 2024-09-17 (×6): 40 mg via ORAL
  Filled 2024-09-12 (×7): qty 1

## 2024-09-12 MED ORDER — ESCITALOPRAM OXALATE 10 MG PO TABS
10.0000 mg | ORAL_TABLET | Freq: Every day | ORAL | Status: DC
  Administered 2024-09-13 – 2024-09-17 (×5): 10 mg via ORAL
  Filled 2024-09-12 (×5): qty 1

## 2024-09-12 MED ORDER — PANTOPRAZOLE SODIUM 40 MG PO TBEC
40.0000 mg | DELAYED_RELEASE_TABLET | Freq: Every day | ORAL | Status: DC
  Administered 2024-09-13 – 2024-09-17 (×5): 40 mg via ORAL
  Filled 2024-09-12 (×6): qty 1

## 2024-09-12 MED ORDER — MAGNESIUM SULFATE 2 GM/50ML IV SOLN
2.0000 g | Freq: Once | INTRAVENOUS | Status: AC
  Administered 2024-09-12: 2 g via INTRAVENOUS
  Filled 2024-09-12: qty 50

## 2024-09-12 MED ORDER — LEVOTHYROXINE SODIUM 100 MCG PO TABS
100.0000 ug | ORAL_TABLET | Freq: Every day | ORAL | Status: DC
  Administered 2024-09-13 – 2024-09-17 (×5): 100 ug via ORAL
  Filled 2024-09-12 (×5): qty 1

## 2024-09-12 MED ORDER — POTASSIUM CHLORIDE 20 MEQ PO PACK
60.0000 meq | PACK | Freq: Once | ORAL | Status: AC
  Administered 2024-09-12: 60 meq via ORAL
  Filled 2024-09-12: qty 3

## 2024-09-12 MED ORDER — BENZONATATE 100 MG PO CAPS
100.0000 mg | ORAL_CAPSULE | Freq: Once | ORAL | Status: AC
  Administered 2024-09-12: 100 mg via ORAL
  Filled 2024-09-12: qty 1

## 2024-09-12 MED ORDER — SODIUM CHLORIDE 0.9 % IV SOLN
100.0000 mg | Freq: Two times a day (BID) | INTRAVENOUS | Status: DC
  Administered 2024-09-12 – 2024-09-13 (×3): 100 mg via INTRAVENOUS
  Filled 2024-09-12 (×5): qty 100

## 2024-09-12 MED ORDER — APIXABAN 5 MG PO TABS
5.0000 mg | ORAL_TABLET | Freq: Two times a day (BID) | ORAL | Status: DC
  Administered 2024-09-12 – 2024-09-17 (×11): 5 mg via ORAL
  Filled 2024-09-12 (×12): qty 1

## 2024-09-12 MED ORDER — INSULIN ASPART 100 UNIT/ML IJ SOLN
0.0000 [IU] | Freq: Three times a day (TID) | INTRAMUSCULAR | Status: DC
  Administered 2024-09-12: 2 [IU] via SUBCUTANEOUS
  Administered 2024-09-12: 1 [IU] via SUBCUTANEOUS
  Administered 2024-09-13 – 2024-09-14 (×3): 2 [IU] via SUBCUTANEOUS
  Administered 2024-09-15 – 2024-09-16 (×4): 1 [IU] via SUBCUTANEOUS
  Administered 2024-09-16 – 2024-09-17 (×2): 2 [IU] via SUBCUTANEOUS
  Filled 2024-09-12 (×12): qty 1

## 2024-09-12 MED ORDER — SODIUM CHLORIDE 0.9 % IV SOLN
2.0000 g | INTRAVENOUS | Status: DC
  Administered 2024-09-12 – 2024-09-13 (×2): 2 g via INTRAVENOUS
  Filled 2024-09-12 (×3): qty 20

## 2024-09-12 MED ORDER — DM-GUAIFENESIN ER 30-600 MG PO TB12
1.0000 | ORAL_TABLET | Freq: Two times a day (BID) | ORAL | Status: DC
  Administered 2024-09-12 – 2024-09-17 (×11): 1 via ORAL
  Filled 2024-09-12 (×11): qty 1

## 2024-09-12 MED ORDER — AMIODARONE HCL 200 MG PO TABS
200.0000 mg | ORAL_TABLET | Freq: Every day | ORAL | Status: DC
  Administered 2024-09-12 – 2024-09-17 (×6): 200 mg via ORAL
  Filled 2024-09-12 (×3): qty 2
  Filled 2024-09-12: qty 1
  Filled 2024-09-12: qty 2
  Filled 2024-09-12: qty 1

## 2024-09-12 NOTE — Progress Notes (Addendum)
 Consultation Progress Note   Patient: [name] FMW:[id number] DOB: 1946/04/29 DOA: 09/11/2024 DOS: the patient was seen and examined on 09/12/2024 Primary service: DibiaLandon BRAVO, MD  Brief hospital course: 78 y.o. female with medical history significant of  HFpEF, A-fib/flutter on Eliquis , rheumatic heart disease, AV and MV replacement, CVA, carotid artery stenosis, CKD-3B, lumbar laminectomy and compression and mild cognitive impairment, hypothyroidism, T2DM, COPD who presents to the emergency department due to abdominal pain, nonbloody, but bilious vomiting.  Husband at bedside states that symptoms have been ongoing for the past 3 days.  CT abdomen pelvis with contrast shows. Limited exam due to persistent motion artifact, with the right kidney in particular poorly visualized. 2. Sigmoid diverticulosis without inflammation. 3. Aortic atherosclerosis.  Chest x-ray shows question of developing right mid lung zone airspace opacity. Increased interstitial markings, worsened from prior. Patient was admitted for management of respiratory failure due to acquired pneumonia.    Assessment and Plan: Acute respiratory failure due to presumed CAP POA Hypoxia secondary to community-acquired pneumonia. Continue broad-spectrum antibiotics with Rocephin  and doxycycline . Wean off oxygen  as tolerated. Follow-up results of blood cultures, sputum, urine Legionella, strep pneumo. Negative procalcitonin.   Wean of oxygen  as tolerated  Nausea and vomiting Continue Zofran  as needed Protonix  40 mg daily Consider GI consult if it persists.  AKI In the setting of nausea vomiting. Avoid nephrotoxic agents. Serum creatinine closely.    Acute diarrhea -Denies episodes of diarrhea. - monitor closely.   Hypomagnesemia Hypokalemia Magnesium  1.5, ordered 2 g of magnesium  Serum 3.1, ordered 60 mEq of potassium Check labs this evening.   Elevated proBNP proBNP 5,666 Patient with history of  chronic HFpEF Echocardiogram done on 07/22/2024 showed LVEF of 60 to 65%, normal RWMA.  Indeterminate LV diastolic function Continue total input/output, daily weights and fluid restriction Torsemide  and bisoprolol  temporarily held at this time due to soft BP   Elevated troponin possibly due to type II demand ischemia Troponin 31 > 35, patient denies chest pain, continue to trend troponin   Paroxysmal A-fib/flutter Continue amiodarone , Eliquis  Bisoprolol  temporarily held due to soft BP   Essential hypertension  Hold torsemide  due to soft BP   Acquired hypothyroidism Continue home Synthroid    GERD Continue PPI   Mixed hyperlipidemia Continue Lipitor   Type 2 diabetes mellitus with hyperglycemia Continue ISS and hypoglycemia protocol Metformin  will be held at this time   Mood disorder Continue Lexapro .         Subjective: Patient seen at bedside this morning, husband was present. She complains of nausea, no vomiting and abdominal pain. States that pain is worse after eating.  She denies fevers, chills.  Physical Exam: Vitals:   09/12/24 0500 09/12/24 0610 09/12/24 0808 09/12/24 1019  BP:  (!) 128/54 (!) 125/55 (!) 116/52  Pulse:  68 67 65  Resp:  18 18 18   Temp:  98.3 F (36.8 C) 99.1 F (37.3 C) (!) 97.5 F (36.4 C)  TempSrc:  Oral Oral Oral  SpO2:  100% 98% 98%  Weight: 69.1 kg     Height:       General  No acute Distress Eyes: PERRL, lids and conjunctivae normal ENMT: Mucous membranes are moist.   Neck: normal, supple, no masses, no thyromegaly Respiratory: clear to auscultation bilaterally, no wheezing, no crackles. Normal respiratory effort. No accessory muscle use.  Cardiovascular: Regular rate and rhythm, no murmurs / rubs / gallops Abdomen: Soft, nontender nondistended. Musculoskeletal: no clubbing / cyanosis. No joint deformity upper  and lower extremities.  Skin: no rashes, lesions, ulcers. No induration Neurologic: Facial asymmetry, moving  extremity spontaneously, speech fluent. Psychiatric: Normal judgment and insight. Alert and oriented x 3. Normal mood.   Data Reviewed:    CBC    Component Value Date/Time   WBC 14.6 (H) 09/12/2024 0440   RBC 4.29 09/12/2024 0440   HGB 11.7 (L) 09/12/2024 0440   HGB 13.7 01/02/2023 0748   HCT 38.0 09/12/2024 0440   HCT 40.6 01/02/2023 0748   PLT 177 09/12/2024 0440   PLT 198 01/02/2023 0748   MCV 88.6 09/12/2024 0440   MCV 92 01/02/2023 0748   MCH 27.3 09/12/2024 0440   MCHC 30.8 09/12/2024 0440   RDW 16.7 (H) 09/12/2024 0440   RDW 14.3 01/02/2023 0748   LYMPHSABS 0.5 (L) 09/11/2024 1525   MONOABS 0.5 09/11/2024 1525   EOSABS 0.0 09/11/2024 1525   BASOSABS 0.1 09/11/2024 1525   CMP     Component Value Date/Time   NA 135 09/12/2024 0440   NA 139 04/25/2024 0839   K 3.1 (L) 09/12/2024 0440   CL 93 (L) 09/12/2024 0440   CO2 27 09/12/2024 0440   GLUCOSE 106 (H) 09/12/2024 0440   BUN 14 09/12/2024 0440   BUN 21 04/25/2024 0839   CREATININE 1.10 (H) 09/12/2024 0440   CREATININE 0.89 08/08/2013 0924   CALCIUM  8.2 (L) 09/12/2024 0440   PROT 6.9 09/12/2024 0440   ALBUMIN  3.7 09/12/2024 0440   AST 34 09/12/2024 0440   ALT 27 09/12/2024 0440   ALKPHOS 87 09/12/2024 0440   BILITOT 0.8 09/12/2024 0440   EGFR 30 (L) 04/25/2024 0839   GFRNONAA 51 (L) 09/12/2024 0440    Family Communication: Updated husband at bedside  Time spent: 35 minutes.  Author: Landon FORBES Baller, MD 09/12/2024 1:22 PM  For on call review [email or web address].

## 2024-09-12 NOTE — Plan of Care (Signed)

## 2024-09-12 NOTE — TOC Initial Note (Signed)
 Transition of Care [facility]) - Initial/Assessment Note    Patient Details  Name: [name] MRN: [id number] Date of Birth: 1946/04/05  Transition of [facility]) CM/SW Contact:    Hoy DELENA Bigness, LCSW Phone Number: 09/12/2024, 10:25 AM  Clinical Narrative:                 Pt assessed due to high risk for readmission. Pt is from home w/ spouse. Pt has a RW at home she uses at baseline. Pt's spouse provides transportation for pt to appointments. Pt recently discharged from home health services with [facility] Pt not on home O2 but, is currently requiring 2L. ICM will continue to follow for discharge planning.   Expected Discharge Plan: Home/Self Care Barriers to Discharge: Continued Medical Work up   Patient Goals and CMS Choice Patient states their goals for this hospitalization and ongoing recovery are:: To return home CMS Medicare.gov Compare Post Acute Care list provided to:: Patient Choice offered to / list presented to : Patient      Expected Discharge Plan and Services In-house Referral: Clinical Social Work Discharge Planning Services: NA Post Acute Care Choice: NA Living arrangements for the past 2 months: Single Family Home                                      Prior Living Arrangements/Services Living arrangements for the past 2 months: Single Family Home Lives with:: Spouse Patient language and need for interpreter reviewed:: Yes Do you feel safe going back to the place where you live?: Yes      Need for Family Participation in Patient Care: Yes (Comment) Care giver support system in place?: Yes (comment) Current home services: DME (RW) Criminal Activity/Legal Involvement Pertinent to Current Situation/Hospitalization: No - Comment as needed  Activities of Daily Living   ADL Screening (condition at time of admission) Independently performs ADLs?: Yes (appropriate for developmental age) Is the patient deaf or have difficulty hearing?: No Does the  patient have difficulty seeing, even when wearing glasses/contacts?: No Does the patient have difficulty concentrating, remembering, or making decisions?: No  Permission Sought/Granted Permission sought to share information with : Facility Medical Sales Representative, Family Supports Permission granted to share information with : Yes, Verbal Permission Granted  Share Information with NAME: Margaretann, Abate, Emergency Contact  [phone]           Emotional Assessment Appearance:: Appears stated age Attitude/Demeanor/Rapport: Engaged Affect (typically observed): Accepting Orientation: : Oriented to Self, Oriented to Place, Oriented to Situation Alcohol / Substance Use: Not Applicable Psych Involvement: No (comment)  Admission diagnosis:  Acute respiratory failure with hypoxia (HCC) [J96.01] Patient Active Problem List   Diagnosis Date Noted   Generalized weakness 07/22/2024   Elevated d-dimer 07/22/2024   Chronic kidney disease, stage 3b (HCC) 07/22/2024   Chronic heart failure with preserved ejection fraction (HFpEF) (HCC) 07/22/2024   Mood disorder 07/22/2024   Fracture of orbital floor, closed (HCC) 06/29/2024   Injury of face 06/29/2024   Lip laceration 06/29/2024   Chin laceration 06/29/2024   Intraparenchymal hematoma of brain (HCC) 06/28/2024   Heart failure with recovered ejection fraction (HFrecEF) (HCC) 06/28/2024   Atypical atrial flutter (HCC) 01/04/2023   Syncope 11/11/2022   Acute respiratory failure with hypoxia (HCC) 03/31/2022   Acute congestive heart failure (HCC) 03/31/2022   Pneumonia 03/31/2022   RSV infection 06/03/2021   COPD with acute  exacerbation (HCC) 06/01/2021   Type 2 diabetes mellitus treated without insulin  (HCC) 06/01/2021   COPD exacerbation (HCC) 06/01/2021   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   Asthma exacerbation 11/12/2016   Synovial cyst of lumbar spine 05/13/2014   Encounter for therapeutic drug monitoring 11/06/2013    Hyponatremia    Atrial fibrillation (HCC)    Moderate aortic stenosis 07/12/2013   Moderate aortic insufficiency 07/12/2013   S/P MVR (mitral valve replacement) 07/12/2013   Severe mitral valve stenosis 07/09/2013   Mitral stenosis 07/08/2013   Chest pain 07/07/2013   Asthma, cough variant 03/27/2013   Upper airway cough syndrome in pt with mild cough variant asthma  09/17/2012   Dyspnea on exertion 06/13/2012   Edema 06/13/2012   Rheumatic heart disease 06/13/2012   Fasting hyperglycemia    Chronic anticoagulation 01/26/2012   Acquired hypothyroidism 01/23/2012   Cardioembolic stroke (HCC) 01/02/2012   Mixed hyperlipidemia    Essential hypertension    Breast carcinoma (HCC)    Gastroesophageal reflux disease    PCP:  Sheryle Carwin, MD Pharmacy:   [facility] - [place], [place] - [address] [place] [address] Phone: [phone] Fax: [phone]     Social Drivers of Health (SDOH) Social History: SDOH Screenings   Food Insecurity: No Food Insecurity (09/11/2024)  Housing: Low Risk (09/11/2024)  Transportation Needs: No Transportation Needs (09/11/2024)  Utilities: Not At Risk (09/11/2024)  Social Connections: Socially Isolated (09/11/2024)  Tobacco Use: Low Risk (09/11/2024)   SDOH Interventions: Social Connections Interventions: Inpatient TOC, Intervention Not Indicated   Readmission Risk Interventions    09/12/2024   10:24 AM 07/10/2024    2:18 PM 11/14/2022   11:52 AM  Readmission Risk Prevention Plan  Transportation Screening Complete Complete Complete  PCP or Specialist Appt within 3-5 Days   Complete  HRI or Home Care Consult   Complete  Social Work Consult for Recovery Care Planning/Counseling   --  Palliative Care Screening   Not Applicable  Medication Review Oceanographer) Complete Complete Complete  PCP or Specialist appointment within 3-5 days of discharge Complete Complete   HRI or Home Care Consult Complete  Complete   SW Recovery Care/Counseling Consult Complete Complete   Palliative Care Screening Not Applicable Not Applicable   Skilled Nursing Facility Not Applicable Patient Refused

## 2024-09-12 NOTE — Plan of Care (Signed)

## 2024-09-13 ENCOUNTER — Ambulatory Visit (HOSPITAL_COMMUNITY): Admitting: Internal Medicine

## 2024-09-13 DIAGNOSIS — J9601 Acute respiratory failure with hypoxia: Secondary | ICD-10-CM | POA: Diagnosis not present

## 2024-09-13 LAB — BASIC METABOLIC PANEL WITH GFR
Anion gap: 9 (ref 5–15)
BUN: 13 mg/dL (ref 8–23)
CO2: 29 mmol/L (ref 22–32)
Calcium: 8.4 mg/dL — ABNORMAL LOW (ref 8.9–10.3)
Chloride: 94 mmol/L — ABNORMAL LOW (ref 98–111)
Creatinine, Ser: 1.29 mg/dL — ABNORMAL HIGH (ref 0.44–1.00)
GFR, Estimated: 42 mL/min — ABNORMAL LOW (ref >60)
Glucose, Bld: 140 mg/dL — ABNORMAL HIGH (ref 70–99)
Potassium: 3.5 mmol/L (ref 3.5–5.1)
Sodium: 132 mmol/L — ABNORMAL LOW (ref 135–145)

## 2024-09-13 LAB — GLUCOSE, CAPILLARY
Glucose-Capillary: 165 mg/dL — ABNORMAL HIGH (ref 70–99)
Glucose-Capillary: 167 mg/dL — ABNORMAL HIGH (ref 70–99)
Glucose-Capillary: 96 mg/dL (ref 70–99)
Glucose-Capillary: 156 mg/dL — ABNORMAL HIGH (ref 70–99)

## 2024-09-13 LAB — MAGNESIUM: Magnesium: 2.7 mg/dL — ABNORMAL HIGH (ref 1.7–2.4)

## 2024-09-13 MED ORDER — PHENOL 1.4 % MT LIQD
1.0000 | OROMUCOSAL | Status: DC | PRN
  Administered 2024-09-13: 1 via OROMUCOSAL
  Filled 2024-09-13: qty 177

## 2024-09-13 MED ORDER — BENZONATATE 100 MG PO CAPS
100.0000 mg | ORAL_CAPSULE | Freq: Three times a day (TID) | ORAL | Status: DC | PRN
  Administered 2024-09-13 – 2024-09-15 (×2): 100 mg via ORAL
  Filled 2024-09-13 (×2): qty 1

## 2024-09-13 MED ORDER — IPRATROPIUM-ALBUTEROL 0.5-2.5 (3) MG/3ML IN SOLN
3.0000 mL | Freq: Once | RESPIRATORY_TRACT | Status: AC
  Administered 2024-09-13: 3 mL via RESPIRATORY_TRACT
  Filled 2024-09-13: qty 3

## 2024-09-13 MED ADMIN — Doxycycline Hyclate Tab 100 MG: 100 mg | ORAL | NDC 24658031001

## 2024-09-13 MED FILL — Doxycycline Hyclate Tab 100 MG: 100.0000 mg | ORAL | Qty: 1 | Status: AC

## 2024-09-13 NOTE — Progress Notes (Addendum)
 PROGRESS NOTE    [name]  FMW:[id number] DOB: 27-Aug-1946 DOA: 09/11/2024 PCP: Sheryle Carwin, MD   Brief Narrative:   78 y.o. female with medical history significant of  HFpEF, A-fib/flutter on Eliquis , rheumatic heart disease, AV and MV replacement, CVA, carotid artery stenosis, CKD-3B, lumbar laminectomy and compression and mild cognitive impairment, hypothyroidism, T2DM, COPD who presents to the emergency department due to abdominal pain, nonbloody, but bilious vomiting as well as shortness of breath. Being treated for CAP Lives at home with her husband   Assessment & Plan:  Active Problems:   Acute respiratory failure with hypoxia (HCC)   Acute respiratory failure due to presumed possible bacterial right sided CAP POA Hypoxia secondary to community-acquired pneumonia. Continue antibiotics with Rocephin  and doxycycline .  Intravenous doxycycline  changed to p.o. doxycycline  today. Wean off oxygen  as tolerated. Currently, needing 2 L  Follow-up results of blood cultures, sputum, urine Legionella, strep pneumo. Negative procalcitonin.   Wean of oxygen  as tolerated   Nausea and vomiting, resolved Continue Zofran  as needed Protonix  40 mg daily    Likely CKD stage 3b,POA: -No evidence of AKI   Hypomagnesemia Hypokalemia Prn repletion   Elevated proBNP/Chronic HFpEF,POA: proBNP 5,666 Patient with history of chronic HFpEF Echocardiogram done on 07/22/2024 showed LVEF of 60 to 65%, normal RWMA.  Indeterminate LV diastolic function Continue total input/output, daily weights and fluid restriction Torsemide  and bisoprolol  temporarily held at this time due to soft BP   Elevated troponin possibly due to type II demand ischemia Troponin 31 > 35, patient denies chest pain Paroxysmal A-fib/flutter Continue amiodarone , Eliquis  Bisoprolol  temporarily held due to soft BP   Essential hypertension  Hold torsemide  due to soft BP   Acquired hypothyroidism Continue home Synthroid     GERD Continue PPI   Mixed hyperlipidemia Continue Lipitor   Type 2 diabetes mellitus with hyperglycemia Continue ISS and hypoglycemia protocol Metformin  will be held at this time   Mood disorder Continue Lexapro .  Disposition: Lives at home with her husband and walks with a help of a walker   DVT prophylaxis: SCDs Start: 09/11/24 2054 apixaban  (ELIQUIS ) tablet 5 mg     Code Status: Limited: Do not attempt resuscitation (DNR) -DNR-LIMITED -Do Not Intubate/DNI  Family Communication:  Husband at bedside Status is: Inpatient Remains inpatient appropriate because: PNA    Subjective:  She feels better. No episodes of vomiting here. Husband is at the bedside. Complaining of feeling weak. Spoke to the patient and her husband regarding treatment of community-acquired pneumonia as well as getting physical therapy on board.  Examination:  General exam: Appears calm and comfortable, nasal cannula at 2 L Respiratory system: Slight coarse breath sounds over the right hemithorax Cardiovascular system: S1 & S2 heard, RRR. No JVD, murmurs, rubs, gallops or clicks. No pedal edema. Gastrointestinal system: Abdomen is nondistended, soft and nontender. No organomegaly or masses felt. Normal bowel sounds heard. Central nervous system: Alert and oriented. No focal neurological deficits. Extremities: Symmetric 5 x 5 power. Skin: No rashes, lesions or ulcers      Diet Orders (From admission, onward)     Start     Ordered   09/11/24 2056  Diet Heart Room service appropriate? Yes; Fluid consistency: Thin  Diet effective now       Question Answer Comment  Room service appropriate? Yes   Fluid consistency: Thin      09/11/24 2056            Objective: Vitals:   09/13/24 0223 09/13/24  9491 09/13/24 0515 09/13/24 0538  BP: 114/84     Pulse: 69  68   Resp: 16  15   Temp: 98.1 F (36.7 C)     TempSrc: Oral     SpO2: 97%  99% 97%  Weight:  75.3 kg    Height:         Intake/Output Summary (Last 24 hours) at 09/13/2024 0842 Last data filed at 09/13/2024 0224 Gross per 24 hour  Intake 480 ml  Output --  Net 480 ml   Filed Weights   09/11/24 2154 09/12/24 0500 09/13/24 0508  Weight: 69.1 kg 69.1 kg 75.3 kg    Scheduled Meds:  amiodarone   200 mg Oral Daily   apixaban   5 mg Oral BID   atorvastatin   40 mg Oral Daily   dextromethorphan -guaiFENesin   1 tablet Oral BID   escitalopram   10 mg Oral Q breakfast   insulin  aspart  0-9 Units Subcutaneous TID WC   levothyroxine   100 mcg Oral QAC breakfast   pantoprazole   40 mg Oral QAC breakfast   Continuous Infusions:  cefTRIAXone  (ROCEPHIN )  IV 2 g (09/12/24 0924)   doxycycline  (VIBRAMYCIN ) IV 100 mg (09/12/24 2116)    Nutritional status     Body mass index is 30.12 kg/m.  Data Reviewed:   CBC: Recent Labs  Lab 09/11/24 1525 09/12/24 0440  WBC 7.1 14.6*  NEUTROABS 6.0  --   HGB 11.3* 11.7*  HCT 36.8 38.0  MCV 87.4 88.6  PLT 175 177   Basic Metabolic Panel: Recent Labs  Lab 09/11/24 1511 09/11/24 1824 09/12/24 0440 09/12/24 0635 09/12/24 1906  NA 132*  --  135  --  133*  K 3.5  --  3.1*  --  3.8  CL 91*  --  93*  --  94*  CO2 22  --  27  --  28  GLUCOSE 132*  --  106*  --  112*  BUN 16  --  14  --  14  CREATININE 1.32*  --  1.10*  --  1.24*  CALCIUM  8.4*  --  8.2*  --  8.5*  MG  --  1.3* 1.5* 1.6*  --   PHOS  --   --  3.4  --   --    GFR: Estimated Creatinine Clearance: 34.6 mL/min (A) (by C-G formula based on SCr of 1.24 mg/dL (H)). Liver Function Tests: Recent Labs  Lab 09/11/24 1511 09/12/24 0440  AST 44* 34  ALT 32 27  ALKPHOS 90 87  BILITOT 0.8 0.8  PROT 8.7* 6.9  ALBUMIN  3.9 3.7   Recent Labs  Lab 09/11/24 1525  LIPASE 35   No results for input(s): AMMONIA in the last 168 hours. Coagulation Profile: No results for input(s): INR, PROTIME in the last 168 hours. Cardiac Enzymes: No results for input(s): CKTOTAL, CKMB, CKMBINDEX,  TROPONINI in the last 168 hours. BNP (last 3 results) Recent Labs    07/23/24 0441 07/24/24 0438 09/11/24 1824  PROBNP 2,110.0* 2,155.0* 5,666.0*   HbA1C: Recent Labs    09/12/24 0440  HGBA1C 6.3*   CBG: Recent Labs  Lab 09/12/24 0726 09/12/24 1140 09/12/24 1537 09/12/24 2127 09/13/24 0744  GLUCAP 108* 168* 125* 135* 165*   Lipid Profile: No results for input(s): CHOL, HDL, LDLCALC, TRIG, CHOLHDL, LDLDIRECT in the last 72 hours. Thyroid  Function Tests: No results for input(s): TSH, T4TOTAL, FREET4, T3FREE, THYROIDAB in the last 72 hours. Anemia Panel: No results for input(s): VITAMINB12, FOLATE,  FERRITIN, TIBC, IRON, RETICCTPCT in the last 72 hours. Sepsis Labs: Recent Labs  Lab 09/12/24 0440  PROCALCITON <0.10    Recent Results (from the past 240 hours)  Resp panel by RT-PCR (RSV, Flu A&B, Covid) Anterior Nasal Swab     Status: None   Collection Time: 09/11/24  3:25 PM   Specimen: Anterior Nasal Swab  Result Value Ref Range Status   SARS Coronavirus 2 by RT PCR NEGATIVE NEGATIVE Final    Comment: (NOTE) SARS-CoV-2 target nucleic acids are NOT DETECTED.  The SARS-CoV-2 RNA is generally detectable in upper respiratory specimens during the acute phase of infection. The lowest concentration of SARS-CoV-2 viral copies this assay can detect is 138 copies/mL. A negative result does not preclude SARS-Cov-2 infection and should not be used as the sole basis for treatment or other patient management decisions. A negative result may occur with  improper specimen collection/handling, submission of specimen other than nasopharyngeal swab, presence of viral mutation(s) within the areas targeted by this assay, and inadequate number of viral copies(<138 copies/mL). A negative result must be combined with clinical observations, patient history, and epidemiological information. The expected result is Negative.  Fact Sheet for Patients:   bloggercourse.com  Fact Sheet for Healthcare Providers:  seriousbroker.it  This test is no t yet approved or cleared by the [place]  FDA and  has been authorized for detection and/or diagnosis of SARS-CoV-2 by FDA under an Emergency Use Authorization (EUA). This EUA will remain  in effect (meaning this test can be used) for the duration of the COVID-19 declaration under Section 564(b)(1) of the Act, 21 U.S.C.section 360bbb-3(b)(1), unless the authorization is terminated  or revoked sooner.       Influenza A by PCR NEGATIVE NEGATIVE Final   Influenza B by PCR NEGATIVE NEGATIVE Final    Comment: (NOTE) The Xpert Xpress SARS-CoV-2/FLU/RSV plus assay is intended as an aid in the diagnosis of influenza from Nasopharyngeal swab specimens and should not be used as a sole basis for treatment. Nasal washings and aspirates are unacceptable for Xpert Xpress SARS-CoV-2/FLU/RSV testing.  Fact Sheet for Patients: bloggercourse.com  Fact Sheet for Healthcare Providers: seriousbroker.it  This test is not yet approved or cleared by the [place]  FDA and has been authorized for detection and/or diagnosis of SARS-CoV-2 by FDA under an Emergency Use Authorization (EUA). This EUA will remain in effect (meaning this test can be used) for the duration of the COVID-19 declaration under Section 564(b)(1) of the Act, 21 U.S.C. section 360bbb-3(b)(1), unless the authorization is terminated or revoked.     Resp Syncytial Virus by PCR NEGATIVE NEGATIVE Final    Comment: (NOTE) Fact Sheet for Patients: bloggercourse.com  Fact Sheet for Healthcare Providers: seriousbroker.it  This test is not yet approved or cleared by the [place]  FDA and has been authorized for detection and/or diagnosis of SARS-CoV-2 by FDA under an Emergency Use  Authorization (EUA). This EUA will remain in effect (meaning this test can be used) for the duration of the COVID-19 declaration under Section 564(b)(1) of the Act, 21 U.S.C. section 360bbb-3(b)(1), unless the authorization is terminated or revoked.  Performed at [facility], [address] [place], [place] [address]   Culture, blood (routine x 2)     Status: None (Preliminary result)   Collection Time: 09/11/24  8:39 PM   Specimen: BLOOD  Result Value Ref Range Status   Specimen Description BLOOD BLOOD LEFT ARM  Final   Special Requests   Final  BOTTLES DRAWN AEROBIC ONLY Blood Culture adequate volume   Culture   Final    NO GROWTH < 12 HOURS Performed at [facility], [address], [place], [place] [address]    Report Status PENDING  Incomplete  Culture, blood (routine x 2)     Status: None (Preliminary result)   Collection Time: 09/11/24  8:42 PM   Specimen: BLOOD  Result Value Ref Range Status   Specimen Description BLOOD RIGHT ANTECUBITAL  Final   Special Requests   Final    BOTTLES DRAWN AEROBIC ONLY Blood Culture results may not be optimal due to an inadequate volume of blood received in culture bottles   Culture   Final    NO GROWTH < 12 HOURS Performed at [facility], [address] [place], [place] [address]    Report Status PENDING  Incomplete         Radiology Studies: DG Chest Portable 1 View Result Date: 09/11/2024 EXAM: 1 VIEW(S) XRAY OF THE CHEST 09/11/2024 07:45:00 PM COMPARISON: 07/21/2024 CLINICAL HISTORY: hypoxia FINDINGS: LINES, TUBES AND DEVICES: Surgical clips in right chest wall/axilla. Implantable loop recorder in left chest. LUNGS AND PLEURA: Increased interstitial markings, worsened from prior. Nonspecific chronic blunting of the left costophrenic angle. Question of developing right mid lung zone airspace opacity. No pleural effusion. No pneumothorax. HEART AND MEDIASTINUM: Aortic valve replacements. Cardiomegaly with median sternotomy wires.  BONES AND SOFT TISSUES: No acute osseous abnormality. IMPRESSION: 1. Question of developing right mid lung zone airspace opacity. 2. Increased interstitial markings, worsened from prior. Electronically signed by: Morgane Naveau MD 09/11/2024 07:58 PM EST RP Workstation: [id number]   CT ABDOMEN PELVIS W CONTRAST Result Date: 09/11/2024 EXAM: CT ABDOMEN AND PELVIS WITH CONTRAST 09/11/2024 06:03:00 PM TECHNIQUE: CT of the abdomen and pelvis was performed with the administration of 80 mL of iohexol  (OMNIPAQUE ) 300 MG/ML solution. Multiplanar reformatted images are provided for review. Automated exposure control, iterative reconstruction, and/or weight-based adjustment of the mA/kV was utilized to reduce the radiation dose to as low as reasonably achievable. COMPARISON: None available. CLINICAL HISTORY: LLQ abdominal pain. FINDINGS: LIMITATIONS/ARTIFACTS: Limited exam due to persistent motion artifact. LOWER CHEST: No acute abnormality. LIVER: The liver is unremarkable. GALLBLADDER AND BILE DUCTS: Gallbladder is unremarkable. No biliary ductal dilatation. SPLEEN: No acute abnormality. PANCREAS: No acute abnormality. ADRENAL GLANDS: No acute abnormality. KIDNEYS, URETERS AND BLADDER: Right kidney in particular is poorly visualized. No stones in the kidneys or ureters. No hydronephrosis. No perinephric or periureteral stranding. Urinary bladder is unremarkable. GI AND BOWEL: Stomach demonstrates no acute abnormality. Sigmoid diverticulosis without inflammation. There is no bowel obstruction. PERITONEUM AND RETROPERITONEUM: No ascites. No free air. VASCULATURE: Aortic atherosclerosis. Aorta is normal in caliber. LYMPH NODES: No lymphadenopathy. REPRODUCTIVE ORGANS: No acute abnormality. BONES AND SOFT TISSUES: No acute osseous abnormality. No focal soft tissue abnormality. IMPRESSION: 1. Limited exam due to persistent motion artifact, with the right kidney in particular poorly visualized. 2. Sigmoid diverticulosis  without inflammation. 3. Aortic atherosclerosis. Electronically signed by: Lynwood Seip MD 09/11/2024 06:16 PM EST RP Workstation: [id number]           LOS: 2 days   Time spent= 35 mins    Deliliah Room, MD [facility]  If 7PM-7AM, please contact night-coverage  09/13/2024, 8:42 AM

## 2024-09-13 NOTE — Care Management Important Message (Signed)
 Important Message  Patient Details  Name: [name] MRN: [id number] Date of Birth: Jan 30, 1946   Important Message Given:  Yes - Medicare IM     Josetta Wigal L Shaden Higley 09/13/2024, 11:34 AM

## 2024-09-14 ENCOUNTER — Ambulatory Visit

## 2024-09-14 DIAGNOSIS — I4819 Other persistent atrial fibrillation: Secondary | ICD-10-CM

## 2024-09-14 DIAGNOSIS — J9601 Acute respiratory failure with hypoxia: Secondary | ICD-10-CM | POA: Diagnosis not present

## 2024-09-14 LAB — GLUCOSE, CAPILLARY
Glucose-Capillary: 118 mg/dL — ABNORMAL HIGH (ref 70–99)
Glucose-Capillary: 178 mg/dL — ABNORMAL HIGH (ref 70–99)
Glucose-Capillary: 116 mg/dL — ABNORMAL HIGH (ref 70–99)
Glucose-Capillary: 129 mg/dL — ABNORMAL HIGH (ref 70–99)

## 2024-09-14 LAB — LEGIONELLA PNEUMOPHILA SEROGP 1 UR AG
L. pneumophila Serogp 1 Ur Ag: NEGATIVE
Source of Sample: 0

## 2024-09-14 MED ORDER — POLYETHYLENE GLYCOL 3350 17 G PO PACK: 17.0000 g | PACK | Freq: Every day | ORAL | Status: DC | PRN

## 2024-09-14 MED ORDER — TORSEMIDE 20 MG PO TABS
20.0000 mg | ORAL_TABLET | Freq: Every day | ORAL | Status: DC
  Administered 2024-09-14 – 2024-09-17 (×4): 20 mg via ORAL
  Filled 2024-09-14 (×4): qty 1

## 2024-09-14 MED ADMIN — Doxycycline Hyclate Tab 100 MG: 100 mg | ORAL | NDC 24658031001

## 2024-09-14 NOTE — Plan of Care (Signed)
°  Problem: Acute Rehab PT Goals(only PT should resolve) Goal: Pt Will Go Supine/Side To Sit Outcome: Progressing Flowsheets (Taken 09/14/2024 0931) Pt will go Supine/Side to Sit: with modified independence Goal: Patient Will Transfer Sit To/From Stand Outcome: Progressing Flowsheets (Taken 09/14/2024 0931) Patient will transfer sit to/from stand: with modified independence Goal: Pt Will Transfer Bed To Chair/Chair To Bed Outcome: Progressing Flowsheets (Taken 09/14/2024 0931) Pt will Transfer Bed to Chair/Chair to Bed: with modified independence Goal: Pt Will Ambulate Outcome: Progressing Flowsheets (Taken 09/14/2024 0931) Pt will Ambulate:  100 feet  with contact guard assist  with rolling walker  Lang Ada, PT, DPT [facility] Office: [phone] 9:32 AM, 09/14/2024

## 2024-09-14 NOTE — Evaluation (Signed)
 Physical Therapy Evaluation Patient Details Name: [name] MRN: [id number] DOB: 12/22/1945 Today's Date: 09/14/2024  History of Present Illness  78 y.o. female with medical history significant of  HFpEF, A-fib/flutter on Eliquis , rheumatic heart disease, AV and MV replacement, CVA, carotid artery stenosis, CKD-3B, lumbar laminectomy and compression and mild cognitive impairment, hypothyroidism, T2DM, COPD who presents to the emergency department due to abdominal pain, nonbloody, but bilious vomiting as well as shortness of breath.  Being treated for CAP  Lives at home with her husband   Clinical Impression  Patient demonstrates decreased LE strength, abnormal supplemental O2 need, and impaired balance. Patient also demonstrates need for min assist with bed mobility but is modified independent with transfers and ambulation. Pt demonstrates improved ambulation towards end of session as she has been sedentary for a few days now. Patient also demonstrates recovery to 95% O2 after 2 minutes following bathroom transfer, pt was on 3Lpm supplemental O2 for entire session. Patient requires education on PT recommendations, importance of physical activity and decreased fall risk strategies. Patient would benefit from acute skilled physical therapy for increased endurance with O2 saturation with ambulation, increased LE strength, and balance for improved gait quality, return to higher level of function with ADLs, and progress towards therapy goals.         If plan is discharge home, recommend the following: A little help with walking and/or transfers;A little help with bathing/dressing/bathroom;Assistance with cooking/housework;Assistance with feeding;Assist for transportation;Help with stairs or ramp for entrance   Can travel by private vehicle        Equipment Recommendations None recommended by PT  Recommendations for Other Services       Functional Status Assessment Patient has had a recent  decline in their functional status and demonstrates the ability to make significant improvements in function in a reasonable and predictable amount of time.     Precautions / Restrictions Precautions Precautions: Fall Recall of Precautions/Restrictions: Intact Restrictions Weight Bearing Restrictions Per Provider Order: No      Mobility  Bed Mobility Overal bed mobility: Modified Independent                  Transfers Overall transfer level: Modified independent Equipment used: Rolling walker (2 wheels)                    Ambulation/Gait Ambulation/Gait assistance: Modified independent (Device/Increase time), Supervision Gait Distance (Feet): 15 Feet Assistive device: Rolling walker (2 wheels) Gait Pattern/deviations: Decreased stride length, Shuffle Gait velocity: decreased     General Gait Details: pt demonstrates ability to ambulate from bathroom to bed with RW independently with increased time requires  Stairs            Wheelchair Mobility     Tilt Bed    Modified Rankin (Stroke Patients Only)       Balance Overall balance assessment: Modified Independent                                           Pertinent Vitals/Pain Pain Assessment Pain Assessment: No/denies pain    Home Living Family/patient expects to be discharged to:: Private residence Living Arrangements: Spouse/significant other Available Help at Discharge: Family;Available 24 hours/day Type of Home: House Home Access: Stairs to enter Entrance Stairs-Rails: Right;Left Entrance Stairs-Number of Steps: 3 Alternate Level Stairs-Number of Steps: Flight Home Layout: Two level;Able to live on  main level with bedroom/bathroom Home Equipment: Shower seat - built in;Grab bars - tub/shower;Rolling Walker (2 wheels);Rollator (4 wheels);Wheelchair - Careers Adviser (comment)      Prior Function Prior Level of Function : Needs assist;History of Falls (last six  months)       Physical Assist : ADLs (physical);Mobility (physical) Mobility (physical): Bed mobility;Transfers;Gait;Stairs ADLs (physical): Bathing;Dressing;Toileting;IADLs Mobility Comments: Was ambulating household distances using 3 point cane, but  has been using RW recently due to falls ADLs Comments: Pt was independent prior to her recent fall; since being home she has needed assist for bathing, dressing, and PRN assist for toileting.     Extremity/Trunk Assessment   Upper Extremity Assessment Upper Extremity Assessment: Overall WFL for tasks assessed    Lower Extremity Assessment Lower Extremity Assessment: Generalized weakness    Cervical / Trunk Assessment Cervical / Trunk Assessment: Kyphotic  Communication   Communication Communication: No apparent difficulties    Cognition Arousal: Lethargic Behavior During Therapy: Flat affect   PT - Cognitive impairments: Orientation   Orientation impairments: Place                     Following commands: Intact       Cueing Cueing Techniques: Verbal cues     General Comments      Exercises     Assessment/Plan    PT Assessment Patient needs continued PT services  PT Problem List Decreased strength;Decreased balance;Decreased activity tolerance       PT Treatment Interventions DME instruction;Stair training;Functional mobility training;Neuromuscular re-education;Balance training;Therapeutic exercise;Gait training;Therapeutic activities;Patient/family education    PT Goals (Current goals can be found in the Care Plan section)  Acute Rehab PT Goals Patient Stated Goal: to return home PT Goal Formulation: With patient/family Time For Goal Achievement: 09/16/24 Potential to Achieve Goals: Good    Frequency Min 2X/week     Co-evaluation               AM-PAC PT 6 Clicks Mobility  Outcome Measure Help needed turning from your back to your side while in a flat bed without using bedrails?: A  Little Help needed moving from lying on your back to sitting on the side of a flat bed without using bedrails?: A Little Help needed moving to and from a bed to a chair (including a wheelchair)?: A Little Help needed standing up from a chair using your arms (e.g., wheelchair or bedside chair)?: A Little Help needed to walk in hospital room?: A Little Help needed climbing 3-5 steps with a railing? : A Lot 6 Click Score: 17    End of Session Equipment Utilized During Treatment: Gait belt Activity Tolerance: Patient tolerated treatment well;Patient limited by fatigue;Other (comment) (O2 precautions) Patient left: in chair;with call bell/phone within reach;with family/visitor present Nurse Communication: Mobility status;Precautions PT Visit Diagnosis: Other abnormalities of gait and mobility (R26.89);Muscle weakness (generalized) (M62.81)    Time: 9261-9194 PT Time Calculation (min) (ACUTE ONLY): 27 min   Charges:   PT Evaluation $PT Eval Low Complexity: 1 Low PT Treatments $Therapeutic Activity: 8-22 mins PT General Charges $$ ACUTE PT VISIT: 1 Visit         Lang Ada, PT, DPT [facility] Office: [phone] 9:31 AM, 09/14/2024

## 2024-09-14 NOTE — Progress Notes (Addendum)
 PROGRESS NOTE    [name]  FMW:[id number] DOB: 1946-01-30 DOA: 09/11/2024 PCP: Sheryle Carwin, MD   Brief Narrative:   78 y.o. female with medical history significant of  HFpEF, A-fib/flutter on Eliquis , rheumatic heart disease, AV and MV replacement, CVA, carotid artery stenosis, CKD-3B, lumbar laminectomy and compression and mild cognitive impairment, hypothyroidism, T2DM, COPD who presents to the emergency department due to abdominal pain, nonbloody, but bilious vomiting as well as shortness of breath. Being treated for CAP Lives at home with her husband   Assessment & Plan:  Active Problems:   Acute respiratory failure with hypoxia (HCC)   Acute respiratory failure with hypoxia due to presumed possible bacterial right sided CAP POA Hypoxia secondary to community-acquired pneumonia. Continue antibiotics with Rocephin  and doxycycline .  Intravenous doxycycline  changed to p.o. doxycycline  today. Wean off oxygen  as tolerated. Currently, needing 3 L  Follow-up results of blood cultures, sputum, urine Legionella, strep pneumo. Negative procalcitonin.   Tested negative for RSV, COVID and Influenza panel. -Needs 6 minute walk test prior to discharge.    Nausea and vomiting, resolved Continue Zofran  as needed Protonix  40 mg daily    Likely CKD stage 3b,POA: -No evidence of AKI   Hypomagnesemia Hypokalemia Prn repletion   Elevated proBNP/Chronic HFpEF,POA: proBNP 5,666 Patient with history of chronic HFpEF Echocardiogram done on 07/22/2024 showed LVEF of 60 to 65%, normal RWMA.  Indeterminate LV diastolic function Continue total input/output, daily weights and fluid restriction Resumed torsemide  on 12/3  S/p MVR and AVR,POA: 07/18/13 patient underwent MVR with 25 mm Edwards Magna-ease pericardial valve and also AVR with 21 mm Edwards Magna-ease pericardial valve     Elevated troponin possibly due to type II demand ischemia Troponin 31 > 35, patient denies chest  pain  Paroxysmal A-fib/flutter Continue amiodarone , Eliquis      Essential hypertension  Continue to monitor closely   Acquired hypothyroidism Continue home Synthroid    GERD Continue PPI   Mixed hyperlipidemia Continue Lipitor   Type 2 diabetes mellitus with hyperglycemia Continue ISS and hypoglycemia protocol Metformin  will be held at this time   Mood disorder Continue Lexapro .  Disposition: Lives at home with her husband and walks with a help of a walker   DVT prophylaxis: SCDs Start: 09/11/24 2054 apixaban  (ELIQUIS ) tablet 5 mg     Code Status: Limited: Do not attempt resuscitation (DNR) -DNR-LIMITED -Do Not Intubate/DNI  Family Communication:  Husband at bedside Status is: Inpatient Remains inpatient appropriate because: PNA    Subjective:  As per husband at the bedside, she had trouble breathing overnight. She hasn't walked at all since she has been in the hospital so we have placed PT consult.She is on 3 L nasal cannula. She doesn't use oxygen  at home.  Examination:  General exam: Appears calm and comfortable, nasal cannula at 2 L Respiratory system: Slight coarse breath sounds over the right hemithorax Cardiovascular system: S1 & S2 heard, RRR. No JVD, murmurs, rubs, gallops or clicks. No pedal edema. Gastrointestinal system: Abdomen is nondistended, soft and nontender. No organomegaly or masses felt. Normal bowel sounds heard. Central nervous system: Alert and oriented. No focal neurological deficits. Extremities: Symmetric 5 x 5 power. Skin: No rashes, lesions or ulcers      Diet Orders (From admission, onward)     Start     Ordered   09/11/24 2056  Diet Heart Room service appropriate? Yes; Fluid consistency: Thin  Diet effective now       Question Answer Comment  Room service  appropriate? Yes   Fluid consistency: Thin      09/11/24 2056            Objective: Vitals:   09/13/24 0955 09/13/24 1432 09/13/24 1947 09/14/24 0539  BP: (!)  141/62 (!) 129/59 (!) 155/71 (!) 122/53  Pulse:   76 71  Resp: 16 15 20 18   Temp: 98.2 F (36.8 C) (!) 97.4 F (36.3 C) 98.1 F (36.7 C) 98.3 F (36.8 C)  TempSrc: Oral Oral Oral Oral  SpO2:  100% 100%   Weight:    74.5 kg  Height:        Intake/Output Summary (Last 24 hours) at 09/14/2024 0807 Last data filed at 09/14/2024 0550 Gross per 24 hour  Intake 680 ml  Output 400 ml  Net 280 ml   Filed Weights   09/12/24 0500 09/13/24 0508 09/14/24 0539  Weight: 69.1 kg 75.3 kg 74.5 kg    Scheduled Meds:  amiodarone   200 mg Oral Daily   apixaban   5 mg Oral BID   atorvastatin   40 mg Oral Daily   dextromethorphan -guaiFENesin   1 tablet Oral BID   doxycycline   100 mg Oral Q12H   escitalopram   10 mg Oral Q breakfast   insulin  aspart  0-9 Units Subcutaneous TID WC   levothyroxine   100 mcg Oral QAC breakfast   pantoprazole   40 mg Oral QAC breakfast   torsemide   20 mg Oral Daily   Continuous Infusions:  cefTRIAXone  (ROCEPHIN )  IV Stopped (09/13/24 1012)    Nutritional status     Body mass index is 29.8 kg/m.  Data Reviewed:   CBC: Recent Labs  Lab 09/11/24 1525 09/12/24 0440  WBC 7.1 14.6*  NEUTROABS 6.0  --   HGB 11.3* 11.7*  HCT 36.8 38.0  MCV 87.4 88.6  PLT 175 177   Basic Metabolic Panel: Recent Labs  Lab 09/11/24 1511 09/11/24 1824 09/12/24 0440 09/12/24 0635 09/12/24 1906 09/13/24 0743  NA 132*  --  135  --  133* 132*  K 3.5  --  3.1*  --  3.8 3.5  CL 91*  --  93*  --  94* 94*  CO2 22  --  27  --  28 29  GLUCOSE 132*  --  106*  --  112* 140*  BUN 16  --  14  --  14 13  CREATININE 1.32*  --  1.10*  --  1.24* 1.29*  CALCIUM  8.4*  --  8.2*  --  8.5* 8.4*  MG  --  1.3* 1.5* 1.6*  --  2.7*  PHOS  --   --  3.4  --   --   --    GFR: Estimated Creatinine Clearance: 33.1 mL/min (A) (by C-G formula based on SCr of 1.29 mg/dL (H)). Liver Function Tests: Recent Labs  Lab 09/11/24 1511 09/12/24 0440  AST 44* 34  ALT 32 27  ALKPHOS 90 87  BILITOT  0.8 0.8  PROT 8.7* 6.9  ALBUMIN  3.9 3.7   Recent Labs  Lab 09/11/24 1525  LIPASE 35   No results for input(s): AMMONIA in the last 168 hours. Coagulation Profile: No results for input(s): INR, PROTIME in the last 168 hours. Cardiac Enzymes: No results for input(s): CKTOTAL, CKMB, CKMBINDEX, TROPONINI in the last 168 hours. BNP (last 3 results) Recent Labs    07/23/24 0441 07/24/24 0438 09/11/24 1824  PROBNP 2,110.0* 2,155.0* 5,666.0*   HbA1C: Recent Labs    09/12/24 0440  HGBA1C 6.3*  CBG: Recent Labs  Lab 09/13/24 0744 09/13/24 1158 09/13/24 1632 09/13/24 2107 09/14/24 0737  GLUCAP 165* 167* 96 156* 118*   Lipid Profile: No results for input(s): CHOL, HDL, LDLCALC, TRIG, CHOLHDL, LDLDIRECT in the last 72 hours. Thyroid  Function Tests: No results for input(s): TSH, T4TOTAL, FREET4, T3FREE, THYROIDAB in the last 72 hours. Anemia Panel: No results for input(s): VITAMINB12, FOLATE, FERRITIN, TIBC, IRON, RETICCTPCT in the last 72 hours. Sepsis Labs: Recent Labs  Lab 09/12/24 0440  PROCALCITON <0.10    Recent Results (from the past 240 hours)  Resp panel by RT-PCR (RSV, Flu A&B, Covid) Anterior Nasal Swab     Status: None   Collection Time: 09/11/24  3:25 PM   Specimen: Anterior Nasal Swab  Result Value Ref Range Status   SARS Coronavirus 2 by RT PCR NEGATIVE NEGATIVE Final    Comment: (NOTE) SARS-CoV-2 target nucleic acids are NOT DETECTED.  The SARS-CoV-2 RNA is generally detectable in upper respiratory specimens during the acute phase of infection. The lowest concentration of SARS-CoV-2 viral copies this assay can detect is 138 copies/mL. A negative result does not preclude SARS-Cov-2 infection and should not be used as the sole basis for treatment or other patient management decisions. A negative result may occur with  improper specimen collection/handling, submission of specimen other than  nasopharyngeal swab, presence of viral mutation(s) within the areas targeted by this assay, and inadequate number of viral copies(<138 copies/mL). A negative result must be combined with clinical observations, patient history, and epidemiological information. The expected result is Negative.  Fact Sheet for Patients:  bloggercourse.com  Fact Sheet for Healthcare Providers:  seriousbroker.it  This test is no t yet approved or cleared by the [place]  FDA and  has been authorized for detection and/or diagnosis of SARS-CoV-2 by FDA under an Emergency Use Authorization (EUA). This EUA will remain  in effect (meaning this test can be used) for the duration of the COVID-19 declaration under Section 564(b)(1) of the Act, 21 U.S.C.section 360bbb-3(b)(1), unless the authorization is terminated  or revoked sooner.       Influenza A by PCR NEGATIVE NEGATIVE Final   Influenza B by PCR NEGATIVE NEGATIVE Final    Comment: (NOTE) The Xpert Xpress SARS-CoV-2/FLU/RSV plus assay is intended as an aid in the diagnosis of influenza from Nasopharyngeal swab specimens and should not be used as a sole basis for treatment. Nasal washings and aspirates are unacceptable for Xpert Xpress SARS-CoV-2/FLU/RSV testing.  Fact Sheet for Patients: bloggercourse.com  Fact Sheet for Healthcare Providers: seriousbroker.it  This test is not yet approved or cleared by the [place]  FDA and has been authorized for detection and/or diagnosis of SARS-CoV-2 by FDA under an Emergency Use Authorization (EUA). This EUA will remain in effect (meaning this test can be used) for the duration of the COVID-19 declaration under Section 564(b)(1) of the Act, 21 U.S.C. section 360bbb-3(b)(1), unless the authorization is terminated or revoked.     Resp Syncytial Virus by PCR NEGATIVE NEGATIVE Final    Comment:  (NOTE) Fact Sheet for Patients: bloggercourse.com  Fact Sheet for Healthcare Providers: seriousbroker.it  This test is not yet approved or cleared by the [place]  FDA and has been authorized for detection and/or diagnosis of SARS-CoV-2 by FDA under an Emergency Use Authorization (EUA). This EUA will remain in effect (meaning this test can be used) for the duration of the COVID-19 declaration under Section 564(b)(1) of the Act, 21 U.S.C. section 360bbb-3(b)(1), unless the authorization is terminated  or revoked.  Performed at [facility], [address] [place], [place] [address]   Culture, blood (routine x 2)     Status: None (Preliminary result)   Collection Time: 09/11/24  8:39 PM   Specimen: BLOOD  Result Value Ref Range Status   Specimen Description BLOOD BLOOD LEFT ARM  Final   Special Requests   Final    BOTTLES DRAWN AEROBIC ONLY Blood Culture adequate volume   Culture   Final    NO GROWTH 2 DAYS Performed at [facility], [address], [place], [place] [address]    Report Status PENDING  Incomplete  Culture, blood (routine x 2)     Status: None (Preliminary result)   Collection Time: 09/11/24  8:42 PM   Specimen: BLOOD  Result Value Ref Range Status   Specimen Description BLOOD RIGHT ANTECUBITAL  Final   Special Requests   Final    BOTTLES DRAWN AEROBIC ONLY Blood Culture results may not be optimal due to an inadequate volume of blood received in culture bottles   Culture   Final    NO GROWTH 2 DAYS Performed at [facility], [address] [place], [place] [address]    Report Status PENDING  Incomplete         Radiology Studies: No results found.          LOS: 3 days   Time spent= 35 mins    Deliliah Room, MD [facility]  If 7PM-7AM, please contact night-coverage  09/14/2024, 8:07 AM

## 2024-09-15 DIAGNOSIS — R7989 Other specified abnormal findings of blood chemistry: Secondary | ICD-10-CM

## 2024-09-15 LAB — BASIC METABOLIC PANEL WITH GFR
Anion gap: 8 (ref 5–15)
BUN: 14 mg/dL (ref 8–23)
CO2: 31 mmol/L (ref 22–32)
Calcium: 8.3 mg/dL — ABNORMAL LOW (ref 8.9–10.3)
Chloride: 95 mmol/L — ABNORMAL LOW (ref 98–111)
Creatinine, Ser: 1.17 mg/dL — ABNORMAL HIGH (ref 0.44–1.00)
GFR, Estimated: 47 mL/min — ABNORMAL LOW (ref >60)
Glucose, Bld: 76 mg/dL (ref 70–99)
Potassium: 3.2 mmol/L — ABNORMAL LOW (ref 3.5–5.1)
Sodium: 134 mmol/L — ABNORMAL LOW (ref 135–145)

## 2024-09-15 LAB — GLUCOSE, CAPILLARY
Glucose-Capillary: 140 mg/dL — ABNORMAL HIGH (ref 70–99)
Glucose-Capillary: 146 mg/dL — ABNORMAL HIGH (ref 70–99)
Glucose-Capillary: 133 mg/dL — ABNORMAL HIGH (ref 70–99)
Glucose-Capillary: 172 mg/dL — ABNORMAL HIGH (ref 70–99)

## 2024-09-15 MED ORDER — POTASSIUM CHLORIDE 20 MEQ PO PACK
40.0000 meq | PACK | Freq: Two times a day (BID) | ORAL | Status: AC
  Administered 2024-09-15 (×2): 40 meq via ORAL
  Filled 2024-09-15 (×2): qty 2

## 2024-09-15 MED ORDER — IPRATROPIUM-ALBUTEROL 0.5-2.5 (3) MG/3ML IN SOLN
3.0000 mL | Freq: Four times a day (QID) | RESPIRATORY_TRACT | Status: DC
  Administered 2024-09-15 – 2024-09-16 (×4): 3 mL via RESPIRATORY_TRACT
  Filled 2024-09-15 (×3): qty 3

## 2024-09-15 MED ADMIN — Doxycycline Hyclate Tab 100 MG: 100 mg | ORAL | NDC 24658031001

## 2024-09-15 NOTE — TOC Progression Note (Signed)
 Transition of Care [facility]) - Progression Note    Patient Details  Name: ROSAMARY BOUDREAU MRN: [id number] Date of Birth: [birth year]  [facility] CM/SW Contact  Noreen KATHEE [place], [place] Phone Number: 09/15/2024, 11:21 AM  Clinical Narrative:     CSW spoke with patient at bedside to discuss PT recommendation for HHPT. Pt agreeable and HH agency choice is [facility]. Referral sent via HUB. ICM will continue to follow.   Expected Discharge Plan: Home w Home Health Services Barriers to Discharge: Continued Medical Work up               Expected Discharge Plan and Services In-house Referral: Clinical Social Work Discharge Planning Services: NA Post Acute Care Choice: NA Living arrangements for the past 2 months: Single Family Home                                       Social Drivers of Health (SDOH) Interventions SDOH Screenings   Food Insecurity: No Food Insecurity (09/11/2024)  Housing: Low Risk (09/11/2024)  Transportation Needs: No Transportation Needs (09/11/2024)  Utilities: Not At Risk (09/11/2024)  Social Connections: Socially Isolated (09/11/2024)  Tobacco Use: Low Risk (09/11/2024)    Readmission Risk Interventions    09/15/2024   11:20 AM 09/14/2024    8:58 AM 09/12/2024   10:24 AM  Readmission Risk Prevention Plan  Transportation Screening Complete Complete Complete  Medication Review Oceanographer) Complete Complete Complete  PCP or Specialist appointment within 3-5 days of discharge   Complete  HRI or Home Care Consult Complete Complete Complete  SW Recovery Care/Counseling Consult Complete Complete Complete  Palliative Care Screening Not Applicable Not Applicable Not Applicable  Skilled Nursing Facility Not Applicable Not Applicable Not Applicable

## 2024-09-15 NOTE — Progress Notes (Signed)
 Physical Therapy Treatment Patient Details Name: [name] MRN: [id number] DOB: 06/28/1946 Today's Date: 09/15/2024   History of Present Illness 78 y.o. female with medical history significant of  HFpEF, A-fib/flutter on Eliquis , rheumatic heart disease, AV and MV replacement, CVA, carotid artery stenosis, CKD-3B, lumbar laminectomy and compression and mild cognitive impairment, hypothyroidism, T2DM, COPD who presents to the emergency department due to abdominal pain, nonbloody, but bilious vomiting as well as shortness of breath.  Being treated for CAP  Lives at home with her husband    PT Comments  Patient continues to demonstrate decreased LE strength, decreased gait quality and balance. Patient also demonstrates decreased endurance with ambulation during today's session. Pt has intense coughing episode following walking which prolongs O2 saturation recovery which after 5 miuntes returned to 93% O2 with no supplemental O2. Patient able to progress ambulation today with good performance with RW and verbal cueing. Patient would continue to benefit from skilled acute physical therapy for increased endurance with ambulation, increased LE strength, and improved balance for improved quality of life, improved independence with gait training and continued progress towards therapy goals.     If plan is discharge home, recommend the following: A little help with walking and/or transfers;A little help with bathing/dressing/bathroom;Assistance with cooking/housework;Assistance with feeding;Assist for transportation;Help with stairs or ramp for entrance   Can travel by private vehicle        Equipment Recommendations  None recommended by PT    Recommendations for Other Services       Precautions / Restrictions Precautions Precautions: Fall Recall of Precautions/Restrictions: Intact Restrictions Weight Bearing Restrictions Per Provider Order: No     Mobility  Bed Mobility                     Transfers Overall transfer level: Modified independent Equipment used: Rolling walker (2 wheels)                    Ambulation/Gait Ambulation/Gait assistance: Modified independent (Device/Increase time), Supervision Gait Distance (Feet): 20 Feet Assistive device: Rolling walker (2 wheels) Gait Pattern/deviations: Decreased stride length, Shuffle Gait velocity: decreased     General Gait Details: pt demonstrates ability to ambulate from recliner to hallway with RW independently with increased time required for breathing breaks   Stairs             Wheelchair Mobility     Tilt Bed    Modified Rankin (Stroke Patients Only)       Balance Overall balance assessment: Modified Independent                                          Communication Communication Communication: No apparent difficulties  Cognition Arousal: Alert Behavior During Therapy: WFL for tasks assessed/performed   PT - Cognitive impairments: No apparent impairments                         Following commands: Intact      Cueing Cueing Techniques: Verbal cues  Exercises      General Comments        Pertinent Vitals/Pain Pain Assessment Pain Assessment: No/denies pain    Home Living                          Prior Function  PT Goals (current goals can now be found in the care plan section) Acute Rehab PT Goals Patient Stated Goal: to return home PT Goal Formulation: With patient/family Time For Goal Achievement: 09/16/24 Potential to Achieve Goals: Good Progress towards PT goals: Progressing toward goals    Frequency    Min 2X/week      PT Plan      Co-evaluation              AM-PAC PT 6 Clicks Mobility   Outcome Measure  Help needed turning from your back to your side while in a flat bed without using bedrails?: A Little Help needed moving from lying on your back to sitting on the side of a  flat bed without using bedrails?: A Little Help needed moving to and from a bed to a chair (including a wheelchair)?: A Little Help needed standing up from a chair using your arms (e.g., wheelchair or bedside chair)?: A Little Help needed to walk in hospital room?: A Little Help needed climbing 3-5 steps with a railing? : A Lot 6 Click Score: 17    End of Session Equipment Utilized During Treatment: Gait belt Activity Tolerance: Patient tolerated treatment well;Patient limited by fatigue;Other (comment) Patient left: in chair;with call bell/phone within reach;with family/visitor present Nurse Communication: Mobility status;Precautions PT Visit Diagnosis: Other abnormalities of gait and mobility (R26.89);Muscle weakness (generalized) (M62.81)     Time: 9096-9079 PT Time Calculation (min) (ACUTE ONLY): 17 min  Charges:    $Therapeutic Activity: 8-22 mins PT General Charges $$ ACUTE PT VISIT: 1 Visit                      Lang Ada, PT, DPT [facility] Office: [phone] 10:18 AM, 09/15/2024

## 2024-09-15 NOTE — Plan of Care (Signed)
 Problem: Education: Goal: Knowledge of General Education information will improve Description: Including pain rating scale, medication(s)/side effects and non-pharmacologic comfort measures Outcome: Progressing   Problem: Health Behavior/Discharge Planning: Goal: Ability to manage health-related needs will improve Outcome: Progressing   Problem: Clinical Measurements: Goal: Ability to maintain clinical measurements within normal limits will improve Outcome: Progressing Goal: Will remain free from infection Outcome: Progressing Goal: Diagnostic test results will improve Outcome: Progressing Goal: Respiratory complications will improve Outcome: Progressing Goal: Cardiovascular complication will be avoided Outcome: Progressing   Problem: Activity: Goal: Risk for activity intolerance will decrease Outcome: Progressing   Problem: Nutrition: Goal: Adequate nutrition will be maintained Outcome: Progressing   Problem: Coping: Goal: Level of anxiety will decrease Outcome: Progressing   Problem: Elimination: Goal: Will not experience complications related to bowel motility Outcome: Progressing Goal: Will not experience complications related to urinary retention Outcome: Progressing   Problem: Pain Managment: Goal: General experience of comfort will improve and/or be controlled Outcome: Progressing   Problem: Safety: Goal: Ability to remain free from injury will improve Outcome: Progressing   Problem: Skin Integrity: Goal: Risk for impaired skin integrity will decrease Outcome: Progressing   Problem: Activity: Goal: Ability to tolerate increased activity will improve Outcome: Progressing   Problem: Clinical Measurements: Goal: Ability to maintain a body temperature in the normal range will improve Outcome: Progressing   Problem: Respiratory: Goal: Ability to maintain adequate ventilation will improve Outcome: Progressing Goal: Ability to maintain a clear airway  will improve Outcome: Progressing   Problem: Education: Goal: Ability to describe self-care measures that may prevent or decrease complications (Diabetes Survival Skills Education) will improve Outcome: Progressing Goal: Individualized Educational Video(s) Outcome: Progressing   Problem: Coping: Goal: Ability to adjust to condition or change in health will improve Outcome: Progressing   Problem: Fluid Volume: Goal: Ability to maintain a balanced intake and output will improve Outcome: Progressing   Problem: Health Behavior/Discharge Planning: Goal: Ability to identify and utilize available resources and services will improve Outcome: Progressing Goal: Ability to manage health-related needs will improve Outcome: Progressing   Problem: Metabolic: Goal: Ability to maintain appropriate glucose levels will improve Outcome: Progressing   Problem: Metabolic: Goal: Ability to maintain appropriate glucose levels will improve Outcome: Progressing   Problem: Nutritional: Goal: Maintenance of adequate nutrition will improve Outcome: Progressing Goal: Progress toward achieving an optimal weight will improve Outcome: Progressing   Problem: Skin Integrity: Goal: Risk for impaired skin integrity will decrease Outcome: Progressing   Problem: Tissue Perfusion: Goal: Adequacy of tissue perfusion will improve Outcome: Progressing   Problem: Education: Goal: Knowledge of General Education information will improve Description: Including pain rating scale, medication(s)/side effects and non-pharmacologic comfort measures Outcome: Progressing   Problem: Health Behavior/Discharge Planning: Goal: Ability to manage health-related needs will improve Outcome: Progressing   Problem: Clinical Measurements: Goal: Ability to maintain clinical measurements within normal limits will improve Outcome: Progressing Goal: Will remain free from infection Outcome: Progressing Goal: Diagnostic test  results will improve Outcome: Progressing Goal: Respiratory complications will improve Outcome: Progressing Goal: Cardiovascular complication will be avoided Outcome: Progressing   Problem: Activity: Goal: Risk for activity intolerance will decrease Outcome: Progressing   Problem: Nutrition: Goal: Adequate nutrition will be maintained Outcome: Progressing   Problem: Coping: Goal: Level of anxiety will decrease Outcome: Progressing   Problem: Elimination: Goal: Will not experience complications related to bowel motility Outcome: Progressing Goal: Will not experience complications related to urinary retention Outcome: Progressing   Problem: Pain Managment: Goal: General experience of  comfort will improve and/or be controlled Outcome: Progressing   Problem: Safety: Goal: Ability to remain free from injury will improve Outcome: Progressing   Problem: Skin Integrity: Goal: Risk for impaired skin integrity will decrease Outcome: Progressing   Problem: Activity: Goal: Ability to tolerate increased activity will improve Outcome: Progressing   Problem: Clinical Measurements: Goal: Ability to maintain a body temperature in the normal range will improve Outcome: Progressing   Problem: Respiratory: Goal: Ability to maintain adequate ventilation will improve Outcome: Progressing Goal: Ability to maintain a clear airway will improve Outcome: Progressing   Problem: Education: Goal: Ability to describe self-care measures that may prevent or decrease complications (Diabetes Survival Skills Education) will improve Outcome: Progressing Goal: Individualized Educational Video(s) Outcome: Progressing   Problem: Coping: Goal: Ability to adjust to condition or change in health will improve Outcome: Progressing   Problem: Fluid Volume: Goal: Ability to maintain a balanced intake and output will improve Outcome: Progressing   Problem: Health Behavior/Discharge Planning: Goal:  Ability to identify and utilize available resources and services will improve Outcome: Progressing Goal: Ability to manage health-related needs will improve Outcome: Progressing   Problem: Metabolic: Goal: Ability to maintain appropriate glucose levels will improve Outcome: Progressing   Problem: Nutritional: Goal: Maintenance of adequate nutrition will improve Outcome: Progressing Goal: Progress toward achieving an optimal weight will improve Outcome: Progressing   Problem: Skin Integrity: Goal: Risk for impaired skin integrity will decrease Outcome: Progressing   Problem: Tissue Perfusion: Goal: Adequacy of tissue perfusion will improve Outcome: Progressing

## 2024-09-15 NOTE — Hospital Course (Addendum)
 75F Hx HFpEF, A-fib/flutter on Eliquis , rheumatic heart disease, AV and MV replacement, CVA, carotid artery stenosis, CKD-3B, lumbar laminectomy and compression, mild cognitive impairment, hypothyroidism, T2DM, COPD who presents with shortness of breath, presumed pneumonia and respiratory failure.   Assessment and Plan:   Acute hypoxic respiratory failure - Initially requiring 3 L nasal cannula to maintain O2 sats in the 90s.  Able to be weaned down to 1-2 L.  Still a bit dyspneic.  Will attempt to wean O2 off this morning.  Showing improvement.   Community-acquired pneumonia - Worsening dyspnea and hypoxia on presentation.  Chest x-ray noting RML opacity.  Rapid flu/COVID/RSV negative.  Strep pneumo/Legionella negative.  Improving on empiric antibiotic therapy, doxycycline .  Nebulizers on board.  Leukocytosis resolved.   Acute COPD exacerbation - Underlying COPD likely contributing to patient's hypoxia.  Adding DuoNeb nebulizers.   Acute exacerbation of chronic HFpEF - proBNP 5600 on presentation.  Likely contributing to hypoxia.  Responded well to diuresis.  Resuming p.o. torsemide .   Elevated troponin - Minimally elevated flat suggestive of demand ischemia likely from above.   Hypokalemia/hypomagnesemia - Potassium low again this morning.  Will order 40 mEq twice daily x 2.  Recheck BMP and mag in AM.   CKD 3B - Creatinine appears stable for now.   Possible urinary tract infection - UA on presentation hazy, LE, WBCs, RBCs, bacteria.  Empiric antibiotics on board.  Urine culture pending.   Diabetes mellitus - Insulin  sliding scale on board.   Paroxysmal atrial fibrillation/flutter - Amiodarone , Eliquis  on board.   Hypertension/hypothyroidism/hyperlipidemia - Continue home regiment.   Rheumatic heart disease - S/p mitral valve and aortic valve replacement.

## 2024-09-15 NOTE — Progress Notes (Addendum)
 Progress Note   Patient: [name] FMW:[id number] DOB: 07-03-1946 DOA: 09/11/2024  DOS: the patient was seen and examined on 09/15/2024   Brief hospital course:  72F Hx HFpEF, A-fib/flutter on Eliquis , rheumatic heart disease, AV and MV replacement, CVA, carotid artery stenosis, CKD-3B, lumbar laminectomy and compression, mild cognitive impairment, hypothyroidism, T2DM, COPD who presents with shortness of breath, presumed pneumonia and respiratory failure.  Assessment and Plan:  Acute hypoxic respiratory failure - Initially requiring 3 L nasal cannula to maintain O2 sats in the 90s.  Able to be weaned down to 1 L overnight.  Will attempt to wean O2 off this morning.  Showing improvement.  Community-acquired pneumonia - Worsening dyspnea and hypoxia on presentation.  Chest x-ray noting RML opacity.  Rapid flu/COVID/RSV negative.  Strep pneumo/Legionella negative.  Improving on empiric antibiotic therapy, doxycycline .  Nebulizers on board.  Will recheck CBC to monitor leukocytosis in AM.  Acute COPD exacerbation - Underlying COPD likely contributing to patient's hypoxia.  Adding DuoNeb nebulizers.  Acute exacerbation of chronic HFpEF - proBNP 5600 on presentation.  Likely contributing to hypoxia.  Responded well to diuresis.  Resuming p.o. torsemide .  Elevated troponin - Minimally elevated flat suggestive of demand ischemia likely from above.  Hypokalemia/hypomagnesemia - Potassium low again this morning.  Will order 40 mEq twice daily x 2.  Recheck BMP and mag in AM.  CKD 3B - Creatinine appears stable for now.  Possible urinary tract infection - UA on presentation hazy, LE, WBCs, RBCs, bacteria.  Empiric antibiotics on board.  Urine culture pending.  Diabetes mellitus - Insulin  sliding scale on board.  Paroxysmal atrial fibrillation/flutter - Amiodarone , Eliquis  on board.  Hypertension/hypothyroidism/hyperlipidemia - Continue home regiment.  Rheumatic heart  disease - S/p mitral valve and aortic valve replacement.  Subjective: Patient resting comfortably in bedside chair, husband at bedside.  Currently on 1 L nasal cannula.  States she feels improved but weak overall.  Denies any fever, purulent sputum, chest pain, worsening shortness of breath, nausea, vomiting, abdominal pain.  Physical Exam:  Vitals:   09/14/24 0539 09/14/24 1306 09/14/24 1947 09/15/24 0411  BP: (!) 122/53 (!) 153/64 137/63 (!) 136/56  Pulse: 71     Resp: 18 16 14 16   Temp: 98.3 F (36.8 C) 98 F (36.7 C) 98.2 F (36.8 C) 98.4 F (36.9 C)  TempSrc: Oral Oral Oral Oral  SpO2:      Weight: 74.5 kg   73.4 kg  Height:        GENERAL:  Alert, pleasant, no acute distress  HEENT:  EOMI, nasal cannula CARDIOVASCULAR: Irregularly irregular RESPIRATORY: Poor air movement GASTROINTESTINAL:  Soft, nontender, nondistended EXTREMITIES:  No LE edema bilaterally NEURO:  No new focal deficits appreciated SKIN:  No rashes noted PSYCH:  Appropriate mood and affect     Data Reviewed:  Imaging Studies: DG Chest Portable 1 View Result Date: 09/11/2024 EXAM: 1 VIEW(S) XRAY OF THE CHEST 09/11/2024 07:45:00 PM COMPARISON: 07/21/2024 CLINICAL HISTORY: hypoxia FINDINGS: LINES, TUBES AND DEVICES: Surgical clips in right chest wall/axilla. Implantable loop recorder in left chest. LUNGS AND PLEURA: Increased interstitial markings, worsened from prior. Nonspecific chronic blunting of the left costophrenic angle. Question of developing right mid lung zone airspace opacity. No pleural effusion. No pneumothorax. HEART AND MEDIASTINUM: Aortic valve replacements. Cardiomegaly with median sternotomy wires. BONES AND SOFT TISSUES: No acute osseous abnormality. IMPRESSION: 1. Question of developing right mid lung zone airspace opacity. 2. Increased interstitial markings, worsened from prior. Electronically signed by: Morgane  Naveau MD 09/11/2024 07:58 PM EST RP Workstation: [id number]   CT ABDOMEN  PELVIS W CONTRAST Result Date: 09/11/2024 EXAM: CT ABDOMEN AND PELVIS WITH CONTRAST 09/11/2024 06:03:00 PM TECHNIQUE: CT of the abdomen and pelvis was performed with the administration of 80 mL of iohexol  (OMNIPAQUE ) 300 MG/ML solution. Multiplanar reformatted images are provided for review. Automated exposure control, iterative reconstruction, and/or weight-based adjustment of the mA/kV was utilized to reduce the radiation dose to as low as reasonably achievable. COMPARISON: None available. CLINICAL HISTORY: LLQ abdominal pain. FINDINGS: LIMITATIONS/ARTIFACTS: Limited exam due to persistent motion artifact. LOWER CHEST: No acute abnormality. LIVER: The liver is unremarkable. GALLBLADDER AND BILE DUCTS: Gallbladder is unremarkable. No biliary ductal dilatation. SPLEEN: No acute abnormality. PANCREAS: No acute abnormality. ADRENAL GLANDS: No acute abnormality. KIDNEYS, URETERS AND BLADDER: Right kidney in particular is poorly visualized. No stones in the kidneys or ureters. No hydronephrosis. No perinephric or periureteral stranding. Urinary bladder is unremarkable. GI AND BOWEL: Stomach demonstrates no acute abnormality. Sigmoid diverticulosis without inflammation. There is no bowel obstruction. PERITONEUM AND RETROPERITONEUM: No ascites. No free air. VASCULATURE: Aortic atherosclerosis. Aorta is normal in caliber. LYMPH NODES: No lymphadenopathy. REPRODUCTIVE ORGANS: No acute abnormality. BONES AND SOFT TISSUES: No acute osseous abnormality. No focal soft tissue abnormality. IMPRESSION: 1. Limited exam due to persistent motion artifact, with the right kidney in particular poorly visualized. 2. Sigmoid diverticulosis without inflammation. 3. Aortic atherosclerosis. Electronically signed by: Lynwood Seip MD 09/11/2024 06:16 PM EST RP Workstation: [id number]    There are no new results to review at this time.  Previous records (including but not limited to H&P, progress notes, nursing notes, TOC management) were  reviewed in assessment of this patient.  Labs: CBC: Recent Labs  Lab 09/11/24 1525 09/12/24 0440  WBC 7.1 14.6*  NEUTROABS 6.0  --   HGB 11.3* 11.7*  HCT 36.8 38.0  MCV 87.4 88.6  PLT 175 177   Basic Metabolic Panel: Recent Labs  Lab 09/11/24 1511 09/11/24 1824 09/12/24 0440 09/12/24 0635 09/12/24 1906 09/13/24 0743 09/15/24 0402  NA 132*  --  135  --  133* 132* 134*  K 3.5  --  3.1*  --  3.8 3.5 3.2*  CL 91*  --  93*  --  94* 94* 95*  CO2 22  --  27  --  28 29 31   GLUCOSE 132*  --  106*  --  112* 140* 76  BUN 16  --  14  --  14 13 14   CREATININE 1.32*  --  1.10*  --  1.24* 1.29* 1.17*  CALCIUM  8.4*  --  8.2*  --  8.5* 8.4* 8.3*  MG  --  1.3* 1.5* 1.6*  --  2.7*  --   PHOS  --   --  3.4  --   --   --   --    Liver Function Tests: Recent Labs  Lab 09/11/24 1511 09/12/24 0440  AST 44* 34  ALT 32 27  ALKPHOS 90 87  BILITOT 0.8 0.8  PROT 8.7* 6.9  ALBUMIN  3.9 3.7   CBG: Recent Labs  Lab 09/14/24 0737 09/14/24 1143 09/14/24 1627 09/14/24 2109 09/15/24 0735  GLUCAP 118* 178* 116* 129* 140*    Scheduled Meds:  amiodarone   200 mg Oral Daily   apixaban   5 mg Oral BID   atorvastatin   40 mg Oral Daily   dextromethorphan -guaiFENesin   1 tablet Oral BID   doxycycline   100 mg Oral Q12H   escitalopram   10 mg Oral Q breakfast   insulin  aspart  0-9 Units Subcutaneous TID WC   levothyroxine   100 mcg Oral QAC breakfast   pantoprazole   40 mg Oral QAC breakfast   torsemide   20 mg Oral Daily   Continuous Infusions: PRN Meds:.acetaminophen  **OR** acetaminophen , benzonatate , ondansetron  **OR** ondansetron  (ZOFRAN ) IV, phenol, polyethylene glycol  Family Communication: Husband at bedside  Disposition: Status is: Inpatient Remains inpatient appropriate because: Respiratory failure, pneumonia     Time spent: 36 minutes  Length of inpatient stay: 4 days  Author: Carliss LELON Canales, DO 09/15/2024 10:39 AM  For on call review [email or web address].

## 2024-09-15 NOTE — Plan of Care (Signed)

## 2024-09-16 LAB — BASIC METABOLIC PANEL WITH GFR
Anion gap: 9 (ref 5–15)
BUN: 15 mg/dL (ref 8–23)
CO2: 30 mmol/L (ref 22–32)
Calcium: 8.4 mg/dL — ABNORMAL LOW (ref 8.9–10.3)
Chloride: 95 mmol/L — ABNORMAL LOW (ref 98–111)
Creatinine, Ser: 1.18 mg/dL — ABNORMAL HIGH (ref 0.44–1.00)
GFR, Estimated: 46 mL/min — ABNORMAL LOW (ref >60)
Glucose, Bld: 96 mg/dL (ref 70–99)
Potassium: 3.4 mmol/L — ABNORMAL LOW (ref 3.5–5.1)
Sodium: 134 mmol/L — ABNORMAL LOW (ref 135–145)

## 2024-09-16 LAB — CBC
HCT: 34.6 % — ABNORMAL LOW (ref 36.0–46.0)
Hemoglobin: 10.6 g/dL — ABNORMAL LOW (ref 12.0–15.0)
MCH: 26.8 pg (ref 26.0–34.0)
MCHC: 30.6 g/dL (ref 30.0–36.0)
MCV: 87.6 fL (ref 80.0–100.0)
Platelets: 183 K/uL (ref 150–400)
RBC: 3.95 MIL/uL (ref 3.87–5.11)
RDW: 17 % — ABNORMAL HIGH (ref 11.5–15.5)
WBC: 7.6 K/uL (ref 4.0–10.5)
nRBC: 0 % (ref 0.0–0.2)

## 2024-09-16 LAB — CULTURE, BLOOD (ROUTINE X 2)
Culture: NO GROWTH
Culture: NO GROWTH
Special Requests: ADEQUATE

## 2024-09-16 LAB — MAGNESIUM: Magnesium: 1.4 mg/dL — ABNORMAL LOW (ref 1.7–2.4)

## 2024-09-16 LAB — GLUCOSE, CAPILLARY
Glucose-Capillary: 114 mg/dL — ABNORMAL HIGH (ref 70–99)
Glucose-Capillary: 128 mg/dL — ABNORMAL HIGH (ref 70–99)
Glucose-Capillary: 154 mg/dL — ABNORMAL HIGH (ref 70–99)
Glucose-Capillary: 229 mg/dL — ABNORMAL HIGH (ref 70–99)

## 2024-09-16 LAB — CUP PACEART REMOTE DEVICE CHECK
Date Time Interrogation Session: 20251212232717
Implantable Pulse Generator Implant Date: 20250930

## 2024-09-16 MED ORDER — IPRATROPIUM-ALBUTEROL 0.5-2.5 (3) MG/3ML IN SOLN: 3.0000 mL | Freq: Four times a day (QID) | RESPIRATORY_TRACT | Status: DC | PRN

## 2024-09-16 MED ORDER — IPRATROPIUM-ALBUTEROL 0.5-2.5 (3) MG/3ML IN SOLN
3.0000 mL | Freq: Two times a day (BID) | RESPIRATORY_TRACT | Status: DC
  Administered 2024-09-16 – 2024-09-17 (×2): 3 mL via RESPIRATORY_TRACT
  Filled 2024-09-16 (×2): qty 3

## 2024-09-16 MED ORDER — HYDROXYZINE HCL 25 MG PO TABS
25.0000 mg | ORAL_TABLET | Freq: Once | ORAL | Status: AC | PRN
  Administered 2024-09-16: 02:00:00 25 mg via ORAL
  Filled 2024-09-16: qty 1

## 2024-09-16 MED ORDER — METHYLPREDNISOLONE SODIUM SUCC 40 MG IJ SOLR
40.0000 mg | Freq: Once | INTRAMUSCULAR | Status: AC
  Administered 2024-09-16: 13:00:00 40 mg via INTRAVENOUS
  Filled 2024-09-16: qty 1

## 2024-09-16 MED ADMIN — Doxycycline Hyclate Tab 100 MG: 100 mg | ORAL | @ 09:00:00 | NDC 24658031001

## 2024-09-16 MED ADMIN — Doxycycline Hyclate Tab 100 MG: 100 mg | ORAL | @ 23:00:00 | NDC 24658031001

## 2024-09-16 NOTE — Progress Notes (Signed)
°   09/16/24 0219  Provider Notification  Provider Name/Title Dr. Manfred  Date Provider Notified 09/16/24  Time Provider Notified 0200  Method of Notification  (secure chat)  Notification Reason Other (Comment) (Pt given zofran  and tessalon  pearls earlier in shift for cough and nausea. Pt requesting additional medication for nausea and cough. Pt' s husband is requesting alternative medication for cough, as tessalon  pearls don't work for her.)  Provider response See new orders  Date of Provider Response 09/16/24

## 2024-09-16 NOTE — Plan of Care (Signed)

## 2024-09-16 NOTE — Progress Notes (Signed)
 Pt reports shortness of breath, O2 sats 97% on room air. O2 applied at 2liters,will attempt to wean throughout the shift as pt is able to tolerate.

## 2024-09-16 NOTE — Plan of Care (Signed)
   Problem: Education: Goal: Knowledge of General Education information will improve Description Including pain rating scale, medication(s)/side effects and non-pharmacologic comfort measures Outcome: Progressing   Problem: Health Behavior/Discharge Planning: Goal: Ability to manage health-related needs will improve Outcome: Progressing

## 2024-09-16 NOTE — Progress Notes (Signed)
 Progress Note   Patient: [name] FMW:[id number] DOB: May 15, 1946 DOA: 09/11/2024  DOS: the patient was seen and examined on 09/16/2024   Brief hospital course:  6F Hx HFpEF, A-fib/flutter on Eliquis , rheumatic heart disease, AV and MV replacement, CVA, carotid artery stenosis, CKD-3B, lumbar laminectomy and compression, mild cognitive impairment, hypothyroidism, T2DM, COPD who presents with shortness of breath, presumed pneumonia and respiratory failure.   Assessment and Plan:   Acute hypoxic respiratory failure - Initially requiring 3 L nasal cannula to maintain O2 sats in the 90s.  Able to be weaned down to 1-2 L.  Still a bit dyspneic.  Will attempt to wean O2 off this morning.  Showing improvement.   Community-acquired pneumonia - Worsening dyspnea and hypoxia on presentation.  Chest x-ray noting RML opacity.  Rapid flu/COVID/RSV negative.  Strep pneumo/Legionella negative.  Improving on empiric antibiotic therapy, doxycycline .  Nebulizers on board.  Leukocytosis resolved.  Acute COPD exacerbation - Underlying COPD likely contributing to patient's hypoxia.  Adding DuoNeb nebulizers.   Acute exacerbation of chronic HFpEF - proBNP 5600 on presentation.  Likely contributing to hypoxia.  Responded well to diuresis.  Resuming p.o. torsemide .   Elevated troponin - Minimally elevated flat suggestive of demand ischemia likely from above.   Hypokalemia/hypomagnesemia - Potassium low again this morning.  Will order 40 mEq twice daily x 2.  Recheck BMP and mag in AM.   CKD 3B - Creatinine appears stable for now.   Possible urinary tract infection - UA on presentation hazy, LE, WBCs, RBCs, bacteria.  Empiric antibiotics on board.  Urine culture pending.   Diabetes mellitus - Insulin  sliding scale on board.   Paroxysmal atrial fibrillation/flutter - Amiodarone , Eliquis  on board.   Hypertension/hypothyroidism/hyperlipidemia - Continue home regiment.   Rheumatic heart  disease - S/p mitral valve and aortic valve replacement.   Subjective: Patient resting comfortably in bed.  Currently on 2 L nasal cannula.  Reports from hospital overnight that she still continues to appear dyspneic, short of breath with intermittent coughing.  Patient states she is motivated to go home but admits she is very weak and still on O2.  Denies any fever, purulent sputum, chest pain, nausea, vomiting, abdominal pain.  Physical Exam:  Vitals:   09/15/24 1925 09/16/24 0500 09/16/24 0505 09/16/24 0507  BP: (!) 123/52  (!) 106/50   Pulse: 79  91   Resp: 17  16   Temp: 99 F (37.2 C)  98.6 F (37 C)   TempSrc: Oral  Oral   SpO2: 94%  (!) 89% 93%  Weight:  71.7 kg    Height:        GENERAL:  Alert, pleasant, no acute distress, malaise/weak HEENT:  EOMI, nasal cannula CARDIOVASCULAR: Irregularly irregular RESPIRATORY: Poor air movement GASTROINTESTINAL:  Soft, nontender, nondistended EXTREMITIES:  No LE edema bilaterally NEURO:  No new focal deficits appreciated SKIN:  No rashes noted PSYCH:  Appropriate mood and affect    Data Reviewed:  Imaging Studies: DG Chest Portable 1 View Result Date: 09/11/2024 EXAM: 1 VIEW(S) XRAY OF THE CHEST 09/11/2024 07:45:00 PM COMPARISON: 07/21/2024 CLINICAL HISTORY: hypoxia FINDINGS: LINES, TUBES AND DEVICES: Surgical clips in right chest wall/axilla. Implantable loop recorder in left chest. LUNGS AND PLEURA: Increased interstitial markings, worsened from prior. Nonspecific chronic blunting of the left costophrenic angle. Question of developing right mid lung zone airspace opacity. No pleural effusion. No pneumothorax. HEART AND MEDIASTINUM: Aortic valve replacements. Cardiomegaly with median sternotomy wires. BONES AND SOFT TISSUES: No acute  osseous abnormality. IMPRESSION: 1. Question of developing right mid lung zone airspace opacity. 2. Increased interstitial markings, worsened from prior. Electronically signed by: Morgane Naveau MD  09/11/2024 07:58 PM EST RP Workstation: [id number]   CT ABDOMEN PELVIS W CONTRAST Result Date: 09/11/2024 EXAM: CT ABDOMEN AND PELVIS WITH CONTRAST 09/11/2024 06:03:00 PM TECHNIQUE: CT of the abdomen and pelvis was performed with the administration of 80 mL of iohexol  (OMNIPAQUE ) 300 MG/ML solution. Multiplanar reformatted images are provided for review. Automated exposure control, iterative reconstruction, and/or weight-based adjustment of the mA/kV was utilized to reduce the radiation dose to as low as reasonably achievable. COMPARISON: None available. CLINICAL HISTORY: LLQ abdominal pain. FINDINGS: LIMITATIONS/ARTIFACTS: Limited exam due to persistent motion artifact. LOWER CHEST: No acute abnormality. LIVER: The liver is unremarkable. GALLBLADDER AND BILE DUCTS: Gallbladder is unremarkable. No biliary ductal dilatation. SPLEEN: No acute abnormality. PANCREAS: No acute abnormality. ADRENAL GLANDS: No acute abnormality. KIDNEYS, URETERS AND BLADDER: Right kidney in particular is poorly visualized. No stones in the kidneys or ureters. No hydronephrosis. No perinephric or periureteral stranding. Urinary bladder is unremarkable. GI AND BOWEL: Stomach demonstrates no acute abnormality. Sigmoid diverticulosis without inflammation. There is no bowel obstruction. PERITONEUM AND RETROPERITONEUM: No ascites. No free air. VASCULATURE: Aortic atherosclerosis. Aorta is normal in caliber. LYMPH NODES: No lymphadenopathy. REPRODUCTIVE ORGANS: No acute abnormality. BONES AND SOFT TISSUES: No acute osseous abnormality. No focal soft tissue abnormality. IMPRESSION: 1. Limited exam due to persistent motion artifact, with the right kidney in particular poorly visualized. 2. Sigmoid diverticulosis without inflammation. 3. Aortic atherosclerosis. Electronically signed by: Lynwood Seip MD 09/11/2024 06:16 PM EST RP Workstation: [id number]    There are no new results to review at this time.  Previous records (including but not  limited to H&P, progress notes, nursing notes, TOC management) were reviewed in assessment of this patient.  Labs: CBC: Recent Labs  Lab 09/11/24 1525 09/12/24 0440 09/16/24 0432  WBC 7.1 14.6* 7.6  NEUTROABS 6.0  --   --   HGB 11.3* 11.7* 10.6*  HCT 36.8 38.0 34.6*  MCV 87.4 88.6 87.6  PLT 175 177 183   Basic Metabolic Panel: Recent Labs  Lab 09/11/24 1824 09/12/24 0440 09/12/24 0635 09/12/24 1906 09/13/24 0743 09/15/24 0402 09/16/24 0432  NA  --  135  --  133* 132* 134* 134*  K  --  3.1*  --  3.8 3.5 3.2* 3.4*  CL  --  93*  --  94* 94* 95* 95*  CO2  --  27  --  28 29 31 30   GLUCOSE  --  106*  --  112* 140* 76 96  BUN  --  14  --  14 13 14 15   CREATININE  --  1.10*  --  1.24* 1.29* 1.17* 1.18*  CALCIUM   --  8.2*  --  8.5* 8.4* 8.3* 8.4*  MG 1.3* 1.5* 1.6*  --  2.7*  --  1.4*  PHOS  --  3.4  --   --   --   --   --    Liver Function Tests: Recent Labs  Lab 09/11/24 1511 09/12/24 0440  AST 44* 34  ALT 32 27  ALKPHOS 90 87  BILITOT 0.8 0.8  PROT 8.7* 6.9  ALBUMIN  3.9 3.7   CBG: Recent Labs  Lab 09/15/24 0735 09/15/24 1132 09/15/24 1623 09/15/24 1932 09/16/24 0719  GLUCAP 140* 146* 133* 172* 114*    Scheduled Meds:  amiodarone   200 mg Oral Daily   apixaban   5 mg Oral BID   atorvastatin   40 mg Oral Daily   dextromethorphan -guaiFENesin   1 tablet Oral BID   doxycycline   100 mg Oral Q12H   escitalopram   10 mg Oral Q breakfast   insulin  aspart  0-9 Units Subcutaneous TID WC   ipratropium-albuterol   3 mL Nebulization Q6H   levothyroxine   100 mcg Oral QAC breakfast   pantoprazole   40 mg Oral QAC breakfast   torsemide   20 mg Oral Daily   Continuous Infusions: PRN Meds:.acetaminophen  **OR** acetaminophen , benzonatate , ipratropium-albuterol , ondansetron  **OR** ondansetron  (ZOFRAN ) IV, phenol, polyethylene glycol  Family Communication: Husband at bedside  Disposition: Status is: Inpatient Remains inpatient appropriate because: Respiratory  failure     Time spent: 36 minutes  Length of inpatient stay: 5 days  Author: Carliss LELON Canales, DO 09/16/2024 11:01 AM  For on call review [email or web address].

## 2024-09-16 NOTE — Progress Notes (Signed)
 Mobility Specialist Progress Note:    09/16/24 1300  Mobility  Activity Ambulated with assistance  Level of Assistance Minimal assist, patient does 75% or more  Assistive Device Front wheel walker  Distance Ambulated (ft) 18 ft  Range of Motion/Exercises Active;All extremities  Activity Response Tolerated well  Mobility Referral Yes  Mobility visit 1 Mobility  Mobility Specialist Start Time (ACUTE ONLY) 1300  Mobility Specialist Stop Time (ACUTE ONLY) 1338  Mobility Specialist Time Calculation (min) (ACUTE ONLY) 38 min   Pt received supine, husband in room. Agreeable to mobility, required MinA to stand and CGA to ambulate with RW. Tolerated well, c/o weakness and SOB. SpO2 93% on RA at rest, SpO2 88% on RA during ambulation. SpO2 93-95% on 1L during ambulation, returned supine. All needs met.  [place] Mobility Specialist Please contact via Special Educational Needs Teacher or  Rehab office at [phone]

## 2024-09-17 LAB — BASIC METABOLIC PANEL WITH GFR
Anion gap: 9 (ref 5–15)
BUN: 19 mg/dL (ref 8–23)
CO2: 31 mmol/L (ref 22–32)
Calcium: 8.2 mg/dL — ABNORMAL LOW (ref 8.9–10.3)
Chloride: 95 mmol/L — ABNORMAL LOW (ref 98–111)
Creatinine, Ser: 1.23 mg/dL — ABNORMAL HIGH (ref 0.44–1.00)
GFR, Estimated: 44 mL/min — ABNORMAL LOW (ref >60)
Glucose, Bld: 158 mg/dL — ABNORMAL HIGH (ref 70–99)
Potassium: 3.6 mmol/L (ref 3.5–5.1)
Sodium: 135 mmol/L (ref 135–145)

## 2024-09-17 LAB — GLUCOSE, CAPILLARY
Glucose-Capillary: 154 mg/dL — ABNORMAL HIGH (ref 70–99)
Glucose-Capillary: 120 mg/dL — ABNORMAL HIGH (ref 70–99)

## 2024-09-17 LAB — MAGNESIUM: Magnesium: 1.5 mg/dL — ABNORMAL LOW (ref 1.7–2.4)

## 2024-09-17 MED ORDER — BENZONATATE 100 MG PO CAPS: 100.0000 mg | ORAL_CAPSULE | Freq: Three times a day (TID) | ORAL | 0 refills | Status: AC | PRN

## 2024-09-17 NOTE — Discharge Summary (Signed)
 Physician Discharge Summary   Patient: [name] MRN: [id number] DOB: 16-Aug-1946  Admit date:     09/11/2024  Discharge date: 09/17/2024  Discharge Physician: Carliss LELON Canales   PCP: Sheryle Carwin, MD   Recommendations at discharge:    Pt to be discharged home with home health.   If you experience worsening fever, chills, chest pain, shortness of breath, or other concerning symptoms, please call your PCP or go to the emergency department immediately.  Discharge Diagnoses: Active Problems:   CAP (community acquired pneumonia)   Rheumatic heart disease   Atrial fibrillation (HCC)   COPD with acute exacerbation (HCC)   Acute respiratory failure with hypoxia (HCC)   Acute on chronic heart failure with preserved ejection fraction (HFpEF, >= 50%) (HCC)   Elevated troponin  Resolved Problems:   * No resolved hospital problems. *   Hospital Course:  85F Hx HFpEF, A-fib/flutter on Eliquis , rheumatic heart disease, AV and MV replacement, CVA, carotid artery stenosis, CKD-3B, lumbar laminectomy and compression, mild cognitive impairment, hypothyroidism, T2DM, COPD who presents with shortness of breath, presumed pneumonia and respiratory failure.   Assessment and Plan:   Acute hypoxic respiratory failure - Initially requiring 3 L nasal cannula to maintain O2 sats in the 90s.  Throughout the course of hospital stay was able to be weaned down to room air.  Maintaining O2 sat greater than 90% while ambulatory.  Community-acquired pneumonia - Worsening dyspnea and hypoxia on presentation.  Chest x-ray noting RML opacity.  Rapid flu/COVID/RSV negative.  Strep pneumo/Legionella negative.  Improving on empiric antibiotic therapy, doxycycline .  Nebulizers on board.  Leukocytosis resolved.  Completed antibiotic therapy.  No need for ongoing antibiotics.   Acute COPD exacerbation - Underlying COPD likely contributing to patient's hypoxia.  Received nebulizers, Solu-Medrol  x 1.  No wheezing  appreciated.  Acute exacerbation of chronic HFpEF - proBNP 5600 on presentation.  Likely contributing to hypoxia.  Responded well to diuresis.  Resuming p.o. torsemide .  Patient to resume her normal cardiac regimen upon discharge.   Elevated troponin - Minimally elevated flat suggestive of demand ischemia likely from above.   Hypokalemia/hypomagnesemia - Resolved after replenishment.   CKD 3B - Creatinine appears stable for now.   Possible urinary tract infection - UA on presentation hazy, LE, WBCs, RBCs, bacteria.  Empiric antibiotics on board.  Completed empiric antibiotic therapy.   Diabetes mellitus - Zoom home medication regiment.   Paroxysmal atrial fibrillation/flutter - Amiodarone , Eliquis  on board.   Hypertension/hypothyroidism/hyperlipidemia - Continue home regiment.   Rheumatic heart disease - S/p mitral valve and aortic valve replacement.   Consultants: None Procedures performed: None Disposition: Home health Diet recommendation:  Cardiac and Carb modified diet  DISCHARGE MEDICATION: Allergies as of 09/17/2024   No Known Allergies      Medication List     STOP taking these medications    cefdinir  300 MG capsule Commonly known as: OMNICEF    cephALEXin  500 MG capsule Commonly known as: KEFLEX        TAKE these medications    acetaminophen  325 MG tablet Commonly known as: Tylenol  Take 2 tablets (650 mg total) by mouth every 6 (six) hours as needed. What changed: reasons to take this   albuterol  108 (90 Base) MCG/ACT inhaler Commonly known as: VENTOLIN  HFA Inhale 2 puffs into the lungs every 6 (six) hours as needed for wheezing or shortness of breath.   amiodarone  200 MG tablet Commonly known as: Pacerone  Take 1 tablet (200 mg total) by mouth daily.  What changed: when to take this   apixaban  5 MG Tabs tablet Commonly known as: ELIQUIS  Take 1 tablet (5 mg total) by mouth 2 (two) times daily.   atorvastatin  40 MG tablet Commonly known  as: LIPITOR Take 1 tablet (40 mg total) by mouth daily.   azelastine  0.1 % nasal spray Commonly known as: ASTELIN  Place 1 spray into both nostrils 2 (two) times daily. Use in each nostril as directed What changed:  when to take this reasons to take this   benzonatate  100 MG capsule Commonly known as: TESSALON  Take 1 capsule (100 mg total) by mouth 3 (three) times daily as needed for cough.   escitalopram  10 MG tablet Commonly known as: LEXAPRO  Take 10 mg by mouth daily with breakfast.   feeding supplement Liqd Take 237 mLs by mouth 2 (two) times daily between meals.   levothyroxine  100 MCG tablet Commonly known as: SYNTHROID  Take 100 mcg by mouth daily before breakfast.   meclizine  25 MG tablet Commonly known as: ANTIVERT  Take 1 tablet (25 mg total) by mouth every 6 (six) hours as needed for dizziness.   metFORMIN  500 MG tablet Commonly known as: GLUCOPHAGE  Take 1 tablet (500 mg total) by mouth 2 (two) times daily with a meal.   multivitamin with minerals Tabs tablet Take 1 tablet by mouth in the morning.   ondansetron  4 MG disintegrating tablet Commonly known as: ZOFRAN -ODT Take 4 mg by mouth every 6 (six) hours as needed for nausea or vomiting.   pantoprazole  40 MG tablet Commonly known as: PROTONIX  TAKE 1 TABLET 30 TO 60 MINUTES BEFORE FIRST MEAL OF THE DAY. What changed: See the new instructions.   potassium chloride  SA 20 MEQ tablet Commonly known as: KLOR-CON  M Take 1 tablet (20 mEq total) by mouth daily. What changed: when to take this   SYSTANE OP Place 1 drop into both eyes daily as needed (dry eyes).   torsemide  20 MG tablet Commonly known as: DEMADEX  Take 1 tablet (20 mg total) by mouth daily.   Trelegy Ellipta  100-62.5-25 MCG/ACT Aepb Generic drug: Fluticasone -Umeclidin-Vilant Inhale 1 puff into the lungs daily.        Contact information for after-discharge care     Home Medical Care     [facility]) .    Service: Home Health Services Contact information: [address] [place]  [address] [phone]                     Discharge Exam: Fredricka Weights   09/15/24 0411 09/16/24 0500 09/17/24 0454  Weight: 73.4 kg 71.7 kg 69.2 kg    GENERAL:  Alert, pleasant, no acute distress, malaise/weak HEENT:  EOMI, nasal cannula CARDIOVASCULAR: Irregularly irregular RESPIRATORY: Poor air movement GASTROINTESTINAL:  Soft, nontender, nondistended EXTREMITIES:  No LE edema bilaterally NEURO:  No new focal deficits appreciated SKIN:  No rashes noted PSYCH:  Appropriate mood and affect    Condition at discharge: improving  The results of significant diagnostics from this hospitalization (including imaging, microbiology, ancillary and laboratory) are listed below for reference.   Imaging Studies: CUP PACEART REMOTE DEVICE CHECK Result Date: 09/16/2024 ILR summary report received. Battery status OK. Normal device function. No new symptom, tachy, brady, or pause episodes. No new AF episodes. Monthly summary reports and ROV/PRN LA, CVRS  DG Chest Portable 1 View Result Date: 09/11/2024 EXAM: 1 VIEW(S) XRAY OF THE CHEST 09/11/2024 07:45:00 PM COMPARISON: 07/21/2024 CLINICAL HISTORY: hypoxia FINDINGS: LINES,  TUBES AND DEVICES: Surgical clips in right chest wall/axilla. Implantable loop recorder in left chest. LUNGS AND PLEURA: Increased interstitial markings, worsened from prior. Nonspecific chronic blunting of the left costophrenic angle. Question of developing right mid lung zone airspace opacity. No pleural effusion. No pneumothorax. HEART AND MEDIASTINUM: Aortic valve replacements. Cardiomegaly with median sternotomy wires. BONES AND SOFT TISSUES: No acute osseous abnormality. IMPRESSION: 1. Question of developing right mid lung zone airspace opacity. 2. Increased interstitial markings, worsened from prior. Electronically signed by: Morgane Naveau MD 09/11/2024 07:58  PM EST RP Workstation: [id number]   CT ABDOMEN PELVIS W CONTRAST Result Date: 09/11/2024 EXAM: CT ABDOMEN AND PELVIS WITH CONTRAST 09/11/2024 06:03:00 PM TECHNIQUE: CT of the abdomen and pelvis was performed with the administration of 80 mL of iohexol  (OMNIPAQUE ) 300 MG/ML solution. Multiplanar reformatted images are provided for review. Automated exposure control, iterative reconstruction, and/or weight-based adjustment of the mA/kV was utilized to reduce the radiation dose to as low as reasonably achievable. COMPARISON: None available. CLINICAL HISTORY: LLQ abdominal pain. FINDINGS: LIMITATIONS/ARTIFACTS: Limited exam due to persistent motion artifact. LOWER CHEST: No acute abnormality. LIVER: The liver is unremarkable. GALLBLADDER AND BILE DUCTS: Gallbladder is unremarkable. No biliary ductal dilatation. SPLEEN: No acute abnormality. PANCREAS: No acute abnormality. ADRENAL GLANDS: No acute abnormality. KIDNEYS, URETERS AND BLADDER: Right kidney in particular is poorly visualized. No stones in the kidneys or ureters. No hydronephrosis. No perinephric or periureteral stranding. Urinary bladder is unremarkable. GI AND BOWEL: Stomach demonstrates no acute abnormality. Sigmoid diverticulosis without inflammation. There is no bowel obstruction. PERITONEUM AND RETROPERITONEUM: No ascites. No free air. VASCULATURE: Aortic atherosclerosis. Aorta is normal in caliber. LYMPH NODES: No lymphadenopathy. REPRODUCTIVE ORGANS: No acute abnormality. BONES AND SOFT TISSUES: No acute osseous abnormality. No focal soft tissue abnormality. IMPRESSION: 1. Limited exam due to persistent motion artifact, with the right kidney in particular poorly visualized. 2. Sigmoid diverticulosis without inflammation. 3. Aortic atherosclerosis. Electronically signed by: Lynwood Seip MD 09/11/2024 06:16 PM EST RP Workstation: [id number]    Microbiology: Results for orders placed or performed during the hospital encounter of 09/11/24  Resp  panel by RT-PCR (RSV, Flu A&B, Covid) Anterior Nasal Swab     Status: None   Collection Time: 09/11/24  3:25 PM   Specimen: Anterior Nasal Swab  Result Value Ref Range Status   SARS Coronavirus 2 by RT PCR NEGATIVE NEGATIVE Final    Comment: (NOTE) SARS-CoV-2 target nucleic acids are NOT DETECTED.  The SARS-CoV-2 RNA is generally detectable in upper respiratory specimens during the acute phase of infection. The lowest concentration of SARS-CoV-2 viral copies this assay can detect is 138 copies/mL. A negative result does not preclude SARS-Cov-2 infection and should not be used as the sole basis for treatment or other patient management decisions. A negative result may occur with  improper specimen collection/handling, submission of specimen other than nasopharyngeal swab, presence of viral mutation(s) within the areas targeted by this assay, and inadequate number of viral copies(<138 copies/mL). A negative result must be combined with clinical observations, patient history, and epidemiological information. The expected result is Negative.  Fact Sheet for Patients:  bloggercourse.com  Fact Sheet for Healthcare Providers:  seriousbroker.it  This test is no t yet approved or cleared by the [place]  FDA and  has been authorized for detection and/or diagnosis of SARS-CoV-2 by FDA under an Emergency Use Authorization (EUA). This EUA will remain  in effect (meaning this test can be used) for the duration of the COVID-19 declaration under Section  564(b)(1) of the Act, 21 U.S.C.section 360bbb-3(b)(1), unless the authorization is terminated  or revoked sooner.       Influenza A by PCR NEGATIVE NEGATIVE Final   Influenza B by PCR NEGATIVE NEGATIVE Final    Comment: (NOTE) The Xpert Xpress SARS-CoV-2/FLU/RSV plus assay is intended as an aid in the diagnosis of influenza from Nasopharyngeal swab specimens and should not be used as a  sole basis for treatment. Nasal washings and aspirates are unacceptable for Xpert Xpress SARS-CoV-2/FLU/RSV testing.  Fact Sheet for Patients: bloggercourse.com  Fact Sheet for Healthcare Providers: seriousbroker.it  This test is not yet approved or cleared by the [place]  FDA and has been authorized for detection and/or diagnosis of SARS-CoV-2 by FDA under an Emergency Use Authorization (EUA). This EUA will remain in effect (meaning this test can be used) for the duration of the COVID-19 declaration under Section 564(b)(1) of the Act, 21 U.S.C. section 360bbb-3(b)(1), unless the authorization is terminated or revoked.     Resp Syncytial Virus by PCR NEGATIVE NEGATIVE Final    Comment: (NOTE) Fact Sheet for Patients: bloggercourse.com  Fact Sheet for Healthcare Providers: seriousbroker.it  This test is not yet approved or cleared by the [place]  FDA and has been authorized for detection and/or diagnosis of SARS-CoV-2 by FDA under an Emergency Use Authorization (EUA). This EUA will remain in effect (meaning this test can be used) for the duration of the COVID-19 declaration under Section 564(b)(1) of the Act, 21 U.S.C. section 360bbb-3(b)(1), unless the authorization is terminated or revoked.  Performed at [facility], [address] [place], [place] [address]   Culture, blood (routine x 2)     Status: None   Collection Time: 09/11/24  8:39 PM   Specimen: BLOOD  Result Value Ref Range Status   Specimen Description BLOOD BLOOD LEFT ARM  Final   Special Requests   Final    BOTTLES DRAWN AEROBIC ONLY Blood Culture adequate volume   Culture   Final    NO GROWTH 5 DAYS Performed at [facility], [address] [place], [place] [address]    Report Status 09/16/2024 FINAL  Final  Culture, blood (routine x 2)     Status: None   Collection Time: 09/11/24  8:42 PM    Specimen: BLOOD  Result Value Ref Range Status   Specimen Description BLOOD RIGHT ANTECUBITAL  Final   Special Requests   Final    BOTTLES DRAWN AEROBIC ONLY Blood Culture results may not be optimal due to an inadequate volume of blood received in culture bottles   Culture   Final    NO GROWTH 5 DAYS Performed at [facility], [address], [place], [place] [address]    Report Status 09/16/2024 FINAL  Final   *Note: Due to a large number of results and/or encounters for the requested time period, some results have not been displayed. A complete set of results can be found in Results Review.    Labs: CBC: Recent Labs  Lab 09/11/24 1525 09/12/24 0440 09/16/24 0432  WBC 7.1 14.6* 7.6  NEUTROABS 6.0  --   --   HGB 11.3* 11.7* 10.6*  HCT 36.8 38.0 34.6*  MCV 87.4 88.6 87.6  PLT 175 177 183   Basic Metabolic Panel: Recent Labs  Lab 09/12/24 0440 09/12/24 0635 09/12/24 1906 09/13/24 0743 09/15/24 0402 09/16/24 0432 09/17/24 0424  NA 135  --  133* 132* 134* 134* 135  K 3.1*  --  3.8 3.5 3.2* 3.4*  3.6  CL 93*  --  94* 94* 95* 95* 95*  CO2 27  --  28 29 31 30 31   GLUCOSE 106*  --  112* 140* 76 96 158*  BUN 14  --  14 13 14 15 19   CREATININE 1.10*  --  1.24* 1.29* 1.17* 1.18* 1.23*  CALCIUM  8.2*  --  8.5* 8.4* 8.3* 8.4* 8.2*  MG 1.5* 1.6*  --  2.7*  --  1.4* 1.5*  PHOS 3.4  --   --   --   --   --   --    Liver Function Tests: Recent Labs  Lab 09/11/24 1511 09/12/24 0440  AST 44* 34  ALT 32 27  ALKPHOS 90 87  BILITOT 0.8 0.8  PROT 8.7* 6.9  ALBUMIN  3.9 3.7   CBG: Recent Labs  Lab 09/16/24 0719 09/16/24 1117 09/16/24 1633 09/16/24 2150 09/17/24 0735  GLUCAP 114* 128* 154* 229* 154*    Discharge time spent: 28 minutes.  Length of inpatient stay: 6 days  Signed: Carliss LELON Canales, DO [facility] 09/17/2024

## 2024-09-17 NOTE — Progress Notes (Signed)
 Mobility Specialist Progress Note:    09/17/24 0940  Mobility  Activity Ambulated with assistance  Level of Assistance Minimal assist, patient does 75% or more  Assistive Device Front wheel walker  Distance Ambulated (ft) 20 ft  Range of Motion/Exercises Active;All extremities  Activity Response Tolerated well  Mobility Referral Yes  Mobility visit 1 Mobility  Mobility Specialist Start Time (ACUTE ONLY) 0940  Mobility Specialist Stop Time (ACUTE ONLY) 1005  Mobility Specialist Time Calculation (min) (ACUTE ONLY) 25 min   Pt received sitting EOB, husband in room. Agreeable to mobility, required MinA to stand and SBA to ambulate with RW. Tolerated well, c/o weakness. SpO2 94% on RA at rest, SpO2 90-93% on RA during ambulation. Left in chair, all needs met.  Issaac Shipper Mobility Specialist Please contact via Special Educational Needs Teacher or  Rehab office at [phone]

## 2024-09-17 NOTE — TOC Transition Note (Signed)
 Transition of Care [facility]) - Discharge Note   Patient Details  Name: [name] MRN: [id number] Date of Birth: 1945-10-26  Transition of Care [facility]) CM/SW Contact:  Hoy DELENA Bigness, LCSW Phone Number: 09/17/2024, 11:15 AM   Clinical Narrative:    Pt to return home with spouse. Pt to receive HHPT w/ [facility]. HH orders are in place. No further ICM needs identified.    Final next level of care: Home w Home Health Services Barriers to Discharge: Barriers Resolved   Patient Goals and CMS Choice Patient states their goals for this hospitalization and ongoing recovery are:: To return home CMS Medicare.gov Compare Post Acute Care list provided to:: Patient Choice offered to / list presented to : Patient      Discharge Placement                       Discharge Plan and Services Additional resources added to the After Visit Summary for   In-house Referral: Clinical Social Work Discharge Planning Services: NA Post Acute Care Choice: NA          DME Arranged: N/A DME Agency: NA       HH Arranged: PT HH Agency: [facility] Date [facility] Agency Contacted: 09/17/24 Time HH Agency Contacted: 1115 Representative spoke with at [facility] Agency: Joane  Social Drivers of Health (SDOH) Interventions SDOH Screenings   Food Insecurity: No Food Insecurity (09/11/2024)  Housing: Low Risk (09/11/2024)  Transportation Needs: No Transportation Needs (09/11/2024)  Utilities: Not At Risk (09/11/2024)  Social Connections: Socially Isolated (09/11/2024)  Tobacco Use: Low Risk (09/11/2024)     Readmission Risk Interventions    09/17/2024   11:14 AM 09/16/2024   11:53 AM 09/15/2024   11:20 AM  Readmission Risk Prevention Plan  Transportation Screening Complete Complete Complete  Medication Review Oceanographer) Complete Complete Complete  PCP or Specialist appointment within 3-5 days of discharge Complete    HRI or Home Care Consult Complete Complete Complete  SW  Recovery Care/Counseling Consult Complete Complete Complete  Palliative Care Screening Not Applicable Not Applicable Not Applicable  Skilled Nursing Facility Not Applicable Not Applicable Not Applicable

## 2024-09-17 NOTE — Care Management Important Message (Signed)
 Important Message  Patient Details  Name: [name] MRN: [id number] Date of Birth: 09/10/1946   Important Message Given:  Yes - Medicare IM     Waris Rodger L Le Faulcon 09/17/2024, 11:34 AM

## 2024-09-18 ENCOUNTER — Telehealth: Payer: Self-pay

## 2024-09-18 NOTE — Patient Instructions (Signed)
 Visit Information  Thank you for taking time to visit with me today. Please don't hesitate to contact me if I can be of assistance to you before our next scheduled telephone appointment.  Our next appointment is by telephone on 09/25/24 at 1000 am  Following is a copy of your care plan:   Goals Addressed             This Visit's Progress    COMPLETED: VBCI Transitions of Care (TOC) Care Plan       Problems:  Recent Hospitalization for treatment of Community Acquired Pneumonia Knowledge Deficit Related to Community Acquired Pneumonia and No Hospital Follow Up Provider appointment No PCP follow up appointment. Discussed importance of PCP follow up appointment.   Goal:  Over the next 30 days, the patient will not experience hospital readmission  Interventions:  Transitions of Care: 09/18/24 Spoke with spouse Lamar who reports patient is weak but able to ambulate with walker.  Reviewed signs of worsening pneumonia.  Oxygen  saturation 89-90% this am.  Reviewed encouraging patient to cough and deep breath frequently.  He verbalized understanding.  Spouse states he will make PCP appointment.  Caregiver continues 4 hours per day 5 days a week.  Home health PT to start today.  Reviewed vaccines.  Spouse wanting to get patient the most recent COVID vaccine. Resource given for  [facility] Vaccination Hotline 1-513-558-6189.  Reviewed TOC program. Patient enrolled in 30 day program.    Doctor Visits  - discussed the importance of doctor visits Communication with physician re: 30 day enrollment in [facility] program Reviewed Signs and symptoms of infection  Advised to Contact physician if: You have a new or higher fever. You are coughing more deeply or more often. You are having increased shortness of breath. You are not getting better after 2 days (48 hours). You do not get better as expected.  Patient Self Care Activities:  Attend all scheduled provider appointments Call pharmacy for  medication refills 3-7 days in advance of running out of medications Call provider office for new concerns or questions  Notify RN Care Manager of TOC call rescheduling needs Participate in Transition of Care Program/Attend TOC scheduled calls Take medications as prescribed   Notify physician for any changes or concerns.  Pneumonia: Contact physician if: You have a new or higher fever. You are coughing more deeply or more often. You are having increased shortness of breath. You are not getting better after 2 days (48 hours). You do not get better as expected.   Plan:  Telephone follow up appointment with care management team member scheduled for:  09/25/24 at 1000 am The patient has been provided with contact information for the care management team and has been advised to call with any health related questions or concerns.         Patient verbalizes understanding of instructions and care plan provided today and agrees to view in MyChart. Active MyChart status and patient understanding of how to access instructions and care plan via MyChart confirmed with patient.     The patient has been provided with contact information for the care management team and has been advised to call with any health related questions or concerns.   Please call the care guide team at [phone] if you need to cancel or reschedule your appointment.   Please call the Suicide and Crisis Lifeline: 988 call the [place]  National Suicide Prevention Lifeline: (678)763-7711 or TTY: (551) 588-5422 TTY 215-352-7304) to talk to a trained counselor  if you are experiencing a Mental Health or Behavioral Health Crisis or need someone to talk to.  Searra Carnathan J. Kyleigh Nannini RN, MSN [facility], [facility] RN Care Manager Direct Dial: [phone]  Fax: [phone] Website: [email or web address]

## 2024-09-18 NOTE — Transitions of Care (Post Inpatient/ED Visit) (Signed)
 09/18/2024  Name: [name] MRN: [id number] DOB: 02-26-1946  Today's TOC FU Call Status: Today's TOC FU Call Status:: Successful TOC FU Call Completed TOC FU Call Complete Date: 09/18/24  Patient's Name and Date of Birth confirmed. Name, DOB (Per spouse Lamar)  Transition Care Management Follow-up Telephone Call Date of Discharge: 09/17/24 [facility] Type of Discharge: Inpatient Admission Primary Inpatient Discharge Diagnosis:: CAP (community acquired pneumonia)  Items Reviewed: Did you receive and understand the discharge instructions provided?: Yes Medications obtained,verified, and reconciled?: Yes (Medications Reviewed) Any new allergies since your discharge?: No Dietary orders reviewed?: Yes Type of Diet Ordered:: Heart Healthy Do you have support at home?: Yes People in Home [RPT]: spouse Name of Support/Comfort Primary Source: Lamar  Medications Reviewed Today: Medications Reviewed Today     Reviewed by Bonnita Newby, RN (Case Manager) on 09/18/24 at 1150  Med List Status: <None>   Medication Order Taking? Sig Documenting Provider Last Dose Status Informant  acetaminophen  (TYLENOL ) 325 MG tablet [id number] Yes Take 2 tablets (650 mg total) by mouth every 6 (six) hours as needed.  Patient taking differently: Take 650 mg by mouth every 6 (six) hours as needed for mild pain (pain score 1-3).   [name], DO  Active Spouse/Significant Other, Pharmacy Records  albuterol  (VENTOLIN  HFA) [phone] Base) MCG/ACT inhaler [id number] Yes Inhale 2 puffs into the lungs every 6 (six) hours as needed for wheezing or shortness of breath. [provider]  Active Spouse/Significant Other, Pharmacy Records  amiodarone  (PACERONE ) 200 MG tablet [id number] Yes Take 1 tablet (200 mg total) by mouth daily.  Patient taking differently: Take 200 mg by mouth in the morning.   Mealor, [name], MD  Active Spouse/Significant Other, Pharmacy Records            Med Note WORLEY, [place] S   Thu Mar 14, 2024 10:51 AM)    apixaban  (ELIQUIS ) 5 MG TABS tablet [id number] Yes Take 1 tablet (5 mg total) by mouth 2 (two) times daily. [name], MD  Active Spouse/Significant Other, Pharmacy Records  atorvastatin  (LIPITOR) 40 MG tablet [id number] Yes Take 1 tablet (40 mg total) by mouth daily. [name], MD  Active Spouse/Significant Other, Pharmacy Records  azelastine  (ASTELIN ) 0.1 % nasal spray [id number] Yes Place 1 spray into both nostrils 2 (two) times daily. Use in each nostril as directed  Patient taking differently: Place 1 spray into both nostrils daily as needed for allergies. Use in each nostril as directed   [name], NP  Active Spouse/Significant Other, Pharmacy Records  benzonatate  (TESSALON ) 100 MG capsule [id number] Yes Take 1 capsule (100 mg total) by mouth 3 (three) times daily as needed for cough. [name], DO  Active   escitalopram  (LEXAPRO ) 10 MG tablet [id number] Yes Take 10 mg by mouth daily with breakfast. [provider]  Active Spouse/Significant Other, Pharmacy Records           Med Note BLASE LONELL MARLA [place] Apr 30, 2020  5:38 PM)    feeding supplement (ENSURE PLUS HIGH PROTEIN) LIQD [id number] Yes Take 237 mLs by mouth 2 (two) times daily between meals. [name], MD  Active Spouse/Significant Other, Pharmacy Records  levothyroxine  (SYNTHROID ) 100 MCG tablet [id number] Yes Take 100 mcg by mouth daily before breakfast. [provider]  Active Spouse/Significant Other, Pharmacy Records  meclizine  (ANTIVERT ) 25 MG tablet [id number] Yes Take 1 tablet (25 mg total) by mouth every  6 (six) hours as needed for dizziness. [name], MD  Active Spouse/Significant Other, Pharmacy Records  metFORMIN  (GLUCOPHAGE ) 500 MG tablet [id number] Yes Take 1 tablet (500 mg total) by mouth 2 (two) times daily with a meal. Gerome, Tillman CROME, PA-C  Active Spouse/Significant Other, Pharmacy Records   Multiple Vitamin (MULTIVITAMIN WITH MINERALS) TABS tablet [id number] Yes Take 1 tablet by mouth in the morning. [provider]  Active Spouse/Significant Other, Pharmacy Records  ondansetron  (ZOFRAN -ODT) 4 MG disintegrating tablet [id number] Yes Take 4 mg by mouth every 6 (six) hours as needed for nausea or vomiting. [provider]  Active Spouse/Significant Other, Pharmacy Records  pantoprazole  (PROTONIX ) 40 MG tablet [id number] Yes TAKE 1 TABLET 30 TO 60 MINUTES BEFORE FIRST MEAL OF THE DAY.  Patient taking differently: Take 40 mg by mouth daily before breakfast.   Darlean Ozell NOVAK, MD  Active Spouse/Significant Other, Pharmacy Records  Polyethyl Glycol-Propyl Glycol (SYSTANE OP) [id number]  Place 1 drop into both eyes daily as needed (dry eyes). [provider]  Active Spouse/Significant Other, Pharmacy Records  potassium chloride  SA (KLOR-CON  M) 20 MEQ tablet [id number]  Take 1 tablet (20 mEq total) by mouth daily.  Patient taking differently: Take 20 mEq by mouth in the morning.   Davia Nydia POUR, MD  Active Spouse/Significant Other, Pharmacy Records  torsemide  (DEMADEX ) 20 MG tablet [id number] Yes Take 1 tablet (20 mg total) by mouth daily. [name], MD  Active Spouse/Significant Other, Pharmacy Records  TRELEGY ELLIPTA  100-62.5-25 MCG/ACT AEPB [id number] Yes Inhale 1 puff into the lungs daily. [provider]  Active Spouse/Significant Other, Pharmacy Records            Home Care and Equipment/Supplies: Were Home Health Services Ordered?: Yes Name of Home Health Agency:: [facility] Has Agency set up a time to come to your home?: Yes First Home Health Visit Date: 09/18/24 Any new equipment or medical supplies ordered?: NA  Functional Questionnaire: Do you need assistance with bathing/showering or dressing?: Yes (caregiver bathes patient) Do you need assistance with meal preparation?: Yes (spouse and caregiver prepares meals) Do you need  assistance with eating?: No Do you have difficulty maintaining continence: Yes (patient wears pads) Do you need assistance with getting out of bed/getting out of a chair/moving?: Yes (caregiver and spouse assist) Do you have difficulty managing or taking your medications?: Yes (spouse manages medications)  Follow up appointments reviewed: PCP Follow-up appointment confirmed?: No (spouse states he will call) MD Provider Line Number:[phone] Given: No Specialist Hospital Follow-up appointment confirmed?: NA Do you need transportation to your follow-up appointment?: No Do you understand care options if your condition(s) worsen?: Yes-patient verbalized understanding    Abdifatah Colquhoun DOROTHA Seeds RN, MSN [facility]  [facility], [facility] RN Care Manager Direct Dial: [phone]  Fax: [phone] Website: [email or web address]

## 2024-09-20 NOTE — Progress Notes (Signed)
 Remote Loop Recorder Transmission

## 2024-09-21 ENCOUNTER — Other Ambulatory Visit: Payer: Self-pay | Admitting: Cardiovascular Disease

## 2024-09-25 ENCOUNTER — Other Ambulatory Visit: Payer: Self-pay

## 2024-09-25 NOTE — Patient Instructions (Signed)
 Visit Information  Thank you for taking time to visit with me today. Please don't hesitate to contact me if I can be of assistance to you before our next scheduled telephone appointment.  Our next appointment is by telephone on 10/09/24 at 1000 am  Following is a copy of your care plan:   Goals Addressed             This Visit's Progress    VBCI Transitions of Care (TOC) Care Plan       Problems:  Recent Hospitalization for treatment of Community Acquired Pneumonia Knowledge Deficit Related to Community Acquired Pneumonia   Goal:  Over the next 30 days, the patient will not experience hospital readmission  Interventions:  Transitions of Care: 09/25/24 Spoke with spouse Lamar who reports patient is weak but doing okay.  PCP appointment this morning.  No changes.  Some swelling to legs and feet.  Patient on torsemide , MD made no changes, advised to elevate feet when up.  Spouse admits to eating out recently, advised that the salt content in restaurant food is higher than food cooked at home.  He verbalized understanding.  Home health PT active.  Discussed signs of worsening pneumonia.     Doctor Visits  - discussed the importance of doctor visits Reviewed Signs and symptoms of infection  Advised to Contact physician if: You have a new or higher fever. You are coughing more deeply or more often. You are having increased shortness of breath. You are not getting better after 2 days (48 hours). You do not get better as expected.  Patient Self Care Activities:  Attend all scheduled provider appointments Call pharmacy for medication refills 3-7 days in advance of running out of medications Call provider office for new concerns or questions  Notify RN Care Manager of TOC call rescheduling needs Participate in Transition of Care Program/Attend TOC scheduled calls Take medications as prescribed   Notify physician for any changes or concerns.  Pneumonia: Contact physician if: You have  a new or higher fever. You are coughing more deeply or more often. You are having increased shortness of breath. You are not getting better after 2 days (48 hours). You do not get better as expected.   Plan:  The patient has been provided with contact information for the care management team and has been advised to call with any health related questions or concerns.         Patient verbalizes understanding of instructions and care plan provided today and agrees to view in MyChart. Active MyChart status and patient understanding of how to access instructions and care plan via MyChart confirmed with patient.     The patient has been provided with contact information for the care management team and has been advised to call with any health related questions or concerns.   Please call the care guide team at [phone] if you need to cancel or reschedule your appointment.   Please call the Suicide and Crisis Lifeline: 988 if you are experiencing a Mental Health or Behavioral Health Crisis or need someone to talk to.  Hammond Obeirne J. Marriana Hibberd RN, MSN [facility], [facility] Health RN Care Manager Direct Dial: [phone]  Fax: [phone] Website: [email or web address]

## 2024-09-25 NOTE — Transitions of Care (Post Inpatient/ED Visit) (Signed)
 " Transition of Care week 2  Visit Note  09/25/2024  Name: [name] MRN: [id number]          DOB: April 29, 1946  Situation: Patient enrolled in [facility] 30-day program. Visit completed with spouse [name] by telephone.   Background:   Initial Transition Care Management Follow-up Telephone Call Discharge Date and Diagnosis: 09/17/24, CAP (community acquired pneumonia)   Past Medical History:  Diagnosis Date   Anemia    Asthma 2010   Atrial fibrillation (HCC)    Atrial flutter (HCC)    Breast carcinoma (HCC) 1995   1995   Cardioembolic stroke (HCC) 01/2012   Right frontal in 01/2012; normal carotid ultrasound; possible LAA thrombus by TEE; virtual complete neurologic recovery   Carotid artery stenosis 1961   Chronic kidney disease, stage 2, mildly decreased GFR    GFR of approximately 60   Diabetes mellitus without complication (HCC) 5+ years ago   Controlled most of time   Diverticulosis of colon (without mention of hemorrhage) 2012   Dr. Golda   Fasting hyperglycemia    120 fasting   Gastroesophageal reflux disease    Gastroparesis    Heart failure with improved ejection fraction (HFimpEF) (HCC) 5+ years   Heart murmur 1961   Hemorrhoids    History of aortic valve replacement with bioprosthetic valve    History of mitral valve replacement with bioprosthetic valve    Hyperlipidemia    Hypertension    pt denies 05/30/13     Dr Alvan chester   Hyponatremia    Hypothyroidism    Rheumatic heart disease    Shortness of breath    Syncope and collapse February, 2024    Assessment: Patient Reported Symptoms: Cognitive Cognitive Status: Alert and oriented to person, place, and time, Normal speech and language skills (per spouse)      Neurological Neurological Review of Symptoms: Weakness Neurological Comment: Uses walker for ambulation  HEENT HEENT Symptoms Reported: No symptoms reported      Cardiovascular Cardiovascular Symptoms Reported: Swelling in legs  or feet (some swelling to legs and feet.  advised to elevate leg when up) Does patient have uncontrolled Hypertension?: No Cardiovascular Management Strategies: Medication therapy, Routine screening Cardiovascular Self-Management Outcome: 4 (good)  Respiratory Respiratory Symptoms Reported: Dry cough Other Respiratory Symptoms: Recent pneumonia.  Remains on room air.  PCP visit this morning.  Reviewed signs of pneumonia Respiratory Management Strategies: Routine screening, Breathing exercise Respiratory Self-Management Outcome: 4 (good)  Endocrine Endocrine Symptoms Reported: No symptoms reported    Gastrointestinal Gastrointestinal Symptoms Reported: No symptoms reported      Genitourinary Genitourinary Symptoms Reported: Incontinence Additional Genitourinary Details: Chronic incontinence Genitourinary Management Strategies: Incontinence garment/pad Genitourinary Self-Management Outcome: 4 (good)  Integumentary Integumentary Symptoms Reported: No symptoms reported    Musculoskeletal Musculoskelatal Symptoms Reviewed: Weakness Additional Musculoskeletal Details: Uses walker for ambulation.  Home health PT active Musculoskeletal Management Strategies: Routine screening, Exercise      Psychosocial Psychosocial Symptoms Reported: Not assessed Additional Psychological Details: spouse completes assessment         There were no vitals filed for this visit.    Medications Reviewed Today     Reviewed by [name], RN (Case Manager) on 09/25/24 at 1024  Med List Status: <None>   Medication Order Taking? Sig Documenting Provider Last Dose Status Informant  acetaminophen  (TYLENOL ) 325 MG tablet [id number] Yes Take 2 tablets (650 mg total) by mouth every 6 (six) hours as needed.  Patient taking differently: Take  650 mg by mouth every 6 (six) hours as needed for mild pain (pain score 1-3).   [name], DO  Active Spouse/Significant Other, Pharmacy Records  albuterol  (VENTOLIN  HFA)  [phone] Base) MCG/ACT inhaler [id number] Yes Inhale 2 puffs into the lungs every 6 (six) hours as needed for wheezing or shortness of breath. [provider]  Active Spouse/Significant Other, Pharmacy Records  amiodarone  (PACERONE ) 200 MG tablet [id number] Yes Take 1 tablet (200 mg total) by mouth daily. Mealor, [name], MD  Active   apixaban  (ELIQUIS ) 5 MG TABS tablet [id number] Yes Take 1 tablet (5 mg total) by mouth 2 (two) times daily. [name], MD  Active Spouse/Significant Other, Pharmacy Records  atorvastatin  (LIPITOR) 40 MG tablet [id number] Yes Take 1 tablet (40 mg total) by mouth daily. [name], MD  Active Spouse/Significant Other, Pharmacy Records  azelastine  (ASTELIN ) 0.1 % nasal spray [id number] Yes Place 1 spray into both nostrils 2 (two) times daily. Use in each nostril as directed  Patient taking differently: Place 1 spray into both nostrils daily as needed for allergies. Use in each nostril as directed   [name], NP  Active Spouse/Significant Other, Pharmacy Records  benzonatate  (TESSALON ) 100 MG capsule [id number] Yes Take 1 capsule (100 mg total) by mouth 3 (three) times daily as needed for cough. [name], DO  Active   escitalopram  (LEXAPRO ) 10 MG tablet [id number] Yes Take 10 mg by mouth daily with breakfast. [provider]  Active Spouse/Significant Other, Pharmacy Records           Med Note BLASE LONELL MARLA [place] Apr 30, 2020  5:38 PM)    feeding supplement (ENSURE PLUS HIGH PROTEIN) LIQD [id number] Yes Take 237 mLs by mouth 2 (two) times daily between meals. [name], MD  Active Spouse/Significant Other, Pharmacy Records  levothyroxine  (SYNTHROID ) 100 MCG tablet [id number] Yes Take 100 mcg by mouth daily before breakfast. [provider]  Active Spouse/Significant Other, Pharmacy Records  meclizine  (ANTIVERT ) 25 MG tablet [id number] Yes Take 1 tablet (25 mg total) by mouth every 6 (six) hours as needed for  dizziness. [name], MD  Active Spouse/Significant Other, Pharmacy Records  metFORMIN  (GLUCOPHAGE ) 500 MG tablet [id number] Yes Take 1 tablet (500 mg total) by mouth 2 (two) times daily with a meal. Gerome, Tillman CROME, PA-C  Active Spouse/Significant Other, Pharmacy Records  Multiple Vitamin (MULTIVITAMIN WITH MINERALS) TABS tablet [id number] Yes Take 1 tablet by mouth in the morning. [provider]  Active Spouse/Significant Other, Pharmacy Records  ondansetron  (ZOFRAN -ODT) 4 MG disintegrating tablet [id number] Yes Take 4 mg by mouth every 6 (six) hours as needed for nausea or vomiting. [provider]  Active Spouse/Significant Other, Pharmacy Records  pantoprazole  (PROTONIX ) 40 MG tablet [id number] Yes TAKE 1 TABLET 30 TO 60 MINUTES BEFORE FIRST MEAL OF THE DAY.  Patient taking differently: Take 40 mg by mouth daily before breakfast.   Darlean Ozell NOVAK, MD  Active Spouse/Significant Other, Pharmacy Records  Polyethyl Glycol-Propyl Glycol (SYSTANE OP) [id number] Yes Place 1 drop into both eyes daily as needed (dry eyes). [provider]  Active Spouse/Significant Other, Pharmacy Records  potassium chloride  SA (KLOR-CON  M) 20 MEQ tablet [id number] Yes Take 1 tablet (20 mEq total) by mouth daily.  Patient taking differently: Take 20 mEq by mouth in the morning.   Davia Nydia MARLA, MD  Active Spouse/Significant Other, Pharmacy Records  torsemide  (DEMADEX ) 20 MG tablet [id number] Yes Take 1  tablet (20 mg total) by mouth daily. [name], MD  Active Spouse/Significant Other, Pharmacy Records  TRELEGY ELLIPTA  100-62.5-25 MCG/ACT AEPB [id number] Yes Inhale 1 puff into the lungs daily. [provider]  Active Spouse/Significant Other, Pharmacy Records            Goals Addressed             This Visit's Progress    [facility] Transitions of Care (TOC) Care Plan       Problems:  Recent Hospitalization for treatment of Community Acquired Pneumonia Knowledge  Deficit Related to Community Acquired Pneumonia   Goal:  Over the next 30 days, the patient will not experience hospital readmission  Interventions:  Transitions of Care: 09/25/24 Spoke with spouse Sara who reports patient is weak but doing okay.  PCP appointment this morning.  No changes.  Some swelling to legs and feet.  Patient on torsemide , MD made no changes, advised to elevate feet when up.  Spouse admits to eating out recently, advised that the salt content in restaurant food is higher than food cooked at home.  He verbalized understanding.  Home health PT active.  Discussed signs of worsening pneumonia.     Doctor Visits  - discussed the importance of doctor visits Reviewed Signs and symptoms of infection  Advised to Contact physician if: You have a new or higher fever. You are coughing more deeply or more often. You are having increased shortness of breath. You are not getting better after 2 days (48 hours). You do not get better as expected.  Patient Self Care Activities:  Attend all scheduled provider appointments Call pharmacy for medication refills 3-7 days in advance of running out of medications Call provider office for new concerns or questions  Notify RN Care Manager of TOC call rescheduling needs Participate in Transition of Care Program/Attend TOC scheduled calls Take medications as prescribed   Notify physician for any changes or concerns.  Pneumonia: Contact physician if: You have a new or higher fever. You are coughing more deeply or more often. You are having increased shortness of breath. You are not getting better after 2 days (48 hours). You do not get better as expected.   Plan:  The patient has been provided with contact information for the care management team and has been advised to call with any health related questions or concerns.         Recommendation:   Continue Current Plan of Care  Follow Up Plan:   Telephone follow-up in 2  weeks  Mandel Seiden DOROTHA Seeds RN, MSN [facility], [facility] RN Care Manager Direct Dial: [phone]  Fax: [phone] Website: [email or web address]      "

## 2024-09-30 ENCOUNTER — Ambulatory Visit: Payer: Self-pay | Admitting: Cardiovascular Disease

## 2024-10-08 ENCOUNTER — Inpatient Hospital Stay (HOSPITAL_COMMUNITY)
Admission: EM | Admit: 2024-10-08 | Discharge: 2024-10-26 | DRG: 286 | Disposition: A | Discharge status: Hospice | Attending: Internal Medicine | Admitting: Internal Medicine

## 2024-10-08 ENCOUNTER — Other Ambulatory Visit: Payer: Self-pay

## 2024-10-08 ENCOUNTER — Emergency Department (HOSPITAL_COMMUNITY)

## 2024-10-08 ENCOUNTER — Encounter (HOSPITAL_COMMUNITY): Payer: Self-pay | Admitting: Emergency Medicine

## 2024-10-08 DIAGNOSIS — E871 Hypo-osmolality and hyponatremia: Secondary | ICD-10-CM | POA: Diagnosis present

## 2024-10-08 DIAGNOSIS — E1169 Type 2 diabetes mellitus with other specified complication: Secondary | ICD-10-CM | POA: Insufficient documentation

## 2024-10-08 DIAGNOSIS — I50810 Right heart failure, unspecified: Secondary | ICD-10-CM | POA: Diagnosis not present

## 2024-10-08 DIAGNOSIS — J441 Chronic obstructive pulmonary disease with (acute) exacerbation: Secondary | ICD-10-CM | POA: Diagnosis present

## 2024-10-08 DIAGNOSIS — Z515 Encounter for palliative care: Secondary | ICD-10-CM

## 2024-10-08 DIAGNOSIS — I5041 Acute combined systolic (congestive) and diastolic (congestive) heart failure: Secondary | ICD-10-CM | POA: Diagnosis not present

## 2024-10-08 DIAGNOSIS — Z8261 Family history of arthritis: Secondary | ICD-10-CM

## 2024-10-08 DIAGNOSIS — I350 Nonrheumatic aortic (valve) stenosis: Secondary | ICD-10-CM

## 2024-10-08 DIAGNOSIS — I5031 Acute diastolic (congestive) heart failure: Secondary | ICD-10-CM | POA: Diagnosis not present

## 2024-10-08 DIAGNOSIS — Z794 Long term (current) use of insulin: Secondary | ICD-10-CM

## 2024-10-08 DIAGNOSIS — I5084 End stage heart failure: Secondary | ICD-10-CM | POA: Diagnosis present

## 2024-10-08 DIAGNOSIS — Y831 Surgical operation with implant of artificial internal device as the cause of abnormal reaction of the patient, or of later complication, without mention of misadventure at the time of the procedure: Secondary | ICD-10-CM | POA: Diagnosis present

## 2024-10-08 DIAGNOSIS — Z7984 Long term (current) use of oral hypoglycemic drugs: Secondary | ICD-10-CM

## 2024-10-08 DIAGNOSIS — Z9181 History of falling: Secondary | ICD-10-CM

## 2024-10-08 DIAGNOSIS — Z7901 Long term (current) use of anticoagulants: Secondary | ICD-10-CM

## 2024-10-08 DIAGNOSIS — Z9889 Other specified postprocedural states: Secondary | ICD-10-CM | POA: Diagnosis not present

## 2024-10-08 DIAGNOSIS — E785 Hyperlipidemia, unspecified: Secondary | ICD-10-CM | POA: Diagnosis not present

## 2024-10-08 DIAGNOSIS — Z952 Presence of prosthetic heart valve: Secondary | ICD-10-CM

## 2024-10-08 DIAGNOSIS — I509 Heart failure, unspecified: Principal | ICD-10-CM

## 2024-10-08 DIAGNOSIS — N179 Acute kidney failure, unspecified: Secondary | ICD-10-CM | POA: Diagnosis present

## 2024-10-08 DIAGNOSIS — Z853 Personal history of malignant neoplasm of breast: Secondary | ICD-10-CM

## 2024-10-08 DIAGNOSIS — K219 Gastro-esophageal reflux disease without esophagitis: Secondary | ICD-10-CM | POA: Diagnosis not present

## 2024-10-08 DIAGNOSIS — I050 Rheumatic mitral stenosis: Secondary | ICD-10-CM | POA: Diagnosis not present

## 2024-10-08 DIAGNOSIS — I080 Rheumatic disorders of both mitral and aortic valves: Secondary | ICD-10-CM | POA: Diagnosis present

## 2024-10-08 DIAGNOSIS — I4892 Unspecified atrial flutter: Secondary | ICD-10-CM | POA: Diagnosis present

## 2024-10-08 DIAGNOSIS — E875 Hyperkalemia: Secondary | ICD-10-CM | POA: Diagnosis present

## 2024-10-08 DIAGNOSIS — E1143 Type 2 diabetes mellitus with diabetic autonomic (poly)neuropathy: Secondary | ICD-10-CM | POA: Diagnosis present

## 2024-10-08 DIAGNOSIS — I5082 Biventricular heart failure: Secondary | ICD-10-CM | POA: Diagnosis present

## 2024-10-08 DIAGNOSIS — I10 Essential (primary) hypertension: Secondary | ICD-10-CM | POA: Diagnosis not present

## 2024-10-08 DIAGNOSIS — N1832 Chronic kidney disease, stage 3b: Secondary | ICD-10-CM | POA: Diagnosis present

## 2024-10-08 DIAGNOSIS — E876 Hypokalemia: Secondary | ICD-10-CM | POA: Diagnosis present

## 2024-10-08 DIAGNOSIS — Z1152 Encounter for screening for COVID-19: Secondary | ICD-10-CM | POA: Diagnosis not present

## 2024-10-08 DIAGNOSIS — I2781 Cor pulmonale (chronic): Secondary | ICD-10-CM | POA: Diagnosis present

## 2024-10-08 DIAGNOSIS — Z825 Family history of asthma and other chronic lower respiratory diseases: Secondary | ICD-10-CM

## 2024-10-08 DIAGNOSIS — J45991 Cough variant asthma: Secondary | ICD-10-CM | POA: Diagnosis present

## 2024-10-08 DIAGNOSIS — I440 Atrioventricular block, first degree: Secondary | ICD-10-CM | POA: Diagnosis present

## 2024-10-08 DIAGNOSIS — E1165 Type 2 diabetes mellitus with hyperglycemia: Secondary | ICD-10-CM | POA: Diagnosis not present

## 2024-10-08 DIAGNOSIS — R058 Other specified cough: Secondary | ICD-10-CM | POA: Diagnosis present

## 2024-10-08 DIAGNOSIS — Y92239 Unspecified place in hospital as the place of occurrence of the external cause: Secondary | ICD-10-CM | POA: Diagnosis not present

## 2024-10-08 DIAGNOSIS — D631 Anemia in chronic kidney disease: Secondary | ICD-10-CM | POA: Diagnosis present

## 2024-10-08 DIAGNOSIS — I480 Paroxysmal atrial fibrillation: Secondary | ICD-10-CM | POA: Diagnosis not present

## 2024-10-08 DIAGNOSIS — D696 Thrombocytopenia, unspecified: Secondary | ICD-10-CM | POA: Diagnosis not present

## 2024-10-08 DIAGNOSIS — T82857A Stenosis of cardiac prosthetic devices, implants and grafts, initial encounter: Secondary | ICD-10-CM | POA: Diagnosis present

## 2024-10-08 DIAGNOSIS — I2510 Atherosclerotic heart disease of native coronary artery without angina pectoris: Secondary | ICD-10-CM | POA: Diagnosis present

## 2024-10-08 DIAGNOSIS — E1122 Type 2 diabetes mellitus with diabetic chronic kidney disease: Secondary | ICD-10-CM | POA: Diagnosis present

## 2024-10-08 DIAGNOSIS — Z66 Do not resuscitate: Secondary | ICD-10-CM | POA: Diagnosis present

## 2024-10-08 DIAGNOSIS — I5033 Acute on chronic diastolic (congestive) heart failure: Secondary | ICD-10-CM | POA: Diagnosis present

## 2024-10-08 DIAGNOSIS — N390 Urinary tract infection, site not specified: Secondary | ICD-10-CM | POA: Diagnosis not present

## 2024-10-08 DIAGNOSIS — I130 Hypertensive heart and chronic kidney disease with heart failure and stage 1 through stage 4 chronic kidney disease, or unspecified chronic kidney disease: Principal | ICD-10-CM | POA: Diagnosis present

## 2024-10-08 DIAGNOSIS — Z79899 Other long term (current) drug therapy: Secondary | ICD-10-CM

## 2024-10-08 DIAGNOSIS — E039 Hypothyroidism, unspecified: Secondary | ICD-10-CM | POA: Diagnosis present

## 2024-10-08 DIAGNOSIS — Z79890 Hormone replacement therapy (postmenopausal): Secondary | ICD-10-CM

## 2024-10-08 DIAGNOSIS — Z87440 Personal history of urinary (tract) infections: Secondary | ICD-10-CM

## 2024-10-08 DIAGNOSIS — B9620 Unspecified Escherichia coli [E. coli] as the cause of diseases classified elsewhere: Secondary | ICD-10-CM | POA: Diagnosis not present

## 2024-10-08 DIAGNOSIS — Z833 Family history of diabetes mellitus: Secondary | ICD-10-CM

## 2024-10-08 DIAGNOSIS — I4819 Other persistent atrial fibrillation: Secondary | ICD-10-CM | POA: Diagnosis not present

## 2024-10-08 DIAGNOSIS — Z8673 Personal history of transient ischemic attack (TIA), and cerebral infarction without residual deficits: Secondary | ICD-10-CM

## 2024-10-08 DIAGNOSIS — Z8249 Family history of ischemic heart disease and other diseases of the circulatory system: Secondary | ICD-10-CM

## 2024-10-08 DIAGNOSIS — I110 Hypertensive heart disease with heart failure: Secondary | ICD-10-CM | POA: Diagnosis not present

## 2024-10-08 DIAGNOSIS — Z953 Presence of xenogenic heart valve: Secondary | ICD-10-CM

## 2024-10-08 DIAGNOSIS — T380X5A Adverse effect of glucocorticoids and synthetic analogues, initial encounter: Secondary | ICD-10-CM | POA: Diagnosis not present

## 2024-10-08 DIAGNOSIS — R54 Age-related physical debility: Secondary | ICD-10-CM | POA: Diagnosis present

## 2024-10-08 DIAGNOSIS — Z7189 Other specified counseling: Secondary | ICD-10-CM | POA: Diagnosis not present

## 2024-10-08 DIAGNOSIS — J9601 Acute respiratory failure with hypoxia: Secondary | ICD-10-CM | POA: Diagnosis present

## 2024-10-08 DIAGNOSIS — I272 Pulmonary hypertension, unspecified: Secondary | ICD-10-CM | POA: Diagnosis present

## 2024-10-08 DIAGNOSIS — E7849 Other hyperlipidemia: Secondary | ICD-10-CM | POA: Diagnosis present

## 2024-10-08 LAB — PRO BRAIN NATRIURETIC PEPTIDE: Pro Brain Natriuretic Peptide: 5574 pg/mL — ABNORMAL HIGH

## 2024-10-08 LAB — BASIC METABOLIC PANEL WITH GFR
Anion gap: 11 (ref 5–15)
BUN: 16 mg/dL (ref 8–23)
CO2: 30 mmol/L (ref 22–32)
Calcium: 8.4 mg/dL — ABNORMAL LOW (ref 8.9–10.3)
Chloride: 91 mmol/L — ABNORMAL LOW (ref 98–111)
Creatinine, Ser: 1.55 mg/dL — ABNORMAL HIGH (ref 0.44–1.00)
GFR, Estimated: 33 mL/min — ABNORMAL LOW
Glucose, Bld: 122 mg/dL — ABNORMAL HIGH (ref 70–99)
Potassium: 3.8 mmol/L (ref 3.5–5.1)
Sodium: 133 mmol/L — ABNORMAL LOW (ref 135–145)

## 2024-10-08 LAB — TROPONIN T, HIGH SENSITIVITY: Troponin T High Sensitivity: 42 ng/L — ABNORMAL HIGH (ref 0–19)

## 2024-10-08 LAB — CBC
HCT: 33.9 % — ABNORMAL LOW (ref 36.0–46.0)
Hemoglobin: 10.7 g/dL — ABNORMAL LOW (ref 12.0–15.0)
MCH: 27 pg (ref 26.0–34.0)
MCHC: 31.6 g/dL (ref 30.0–36.0)
MCV: 85.6 fL (ref 80.0–100.0)
Platelets: 143 K/uL — ABNORMAL LOW (ref 150–400)
RBC: 3.96 MIL/uL (ref 3.87–5.11)
RDW: 16.4 % — ABNORMAL HIGH (ref 11.5–15.5)
WBC: 5.7 K/uL (ref 4.0–10.5)
nRBC: 0 % (ref 0.0–0.2)

## 2024-10-08 MED ORDER — FUROSEMIDE 10 MG/ML IJ SOLN
40.0000 mg | Freq: Once | INTRAMUSCULAR | Status: AC
  Administered 2024-10-08: 40 mg via INTRAVENOUS
  Filled 2024-10-08: qty 4

## 2024-10-08 MED ORDER — ACETAMINOPHEN 500 MG PO TABS
500.0000 mg | ORAL_TABLET | Freq: Four times a day (QID) | ORAL | Status: AC | PRN
  Administered 2024-10-09 – 2024-10-21 (×4): 500 mg via ORAL
  Filled 2024-10-08 (×4): qty 1

## 2024-10-08 MED ORDER — POLYETHYLENE GLYCOL 3350 17 G PO PACK: 17.0000 g | PACK | Freq: Every day | ORAL | Status: DC | PRN

## 2024-10-08 MED ORDER — BUDESON-GLYCOPYRROL-FORMOTEROL 160-9-4.8 MCG/ACT IN AERO
2.0000 | INHALATION_SPRAY | Freq: Two times a day (BID) | RESPIRATORY_TRACT | Status: DC
  Administered 2024-10-09: 2 via RESPIRATORY_TRACT
  Filled 2024-10-08 (×2): qty 5.9

## 2024-10-08 MED ORDER — ATORVASTATIN CALCIUM 40 MG PO TABS
40.0000 mg | ORAL_TABLET | Freq: Every day | ORAL | Status: DC
  Administered 2024-10-09 – 2024-10-26 (×17): 40 mg via ORAL
  Filled 2024-10-08 (×17): qty 1

## 2024-10-08 MED ORDER — ENSURE PLUS HIGH PROTEIN PO LIQD
237.0000 mL | Freq: Two times a day (BID) | ORAL | Status: DC
  Administered 2024-10-09 – 2024-10-26 (×19): 237 mL via ORAL
  Filled 2024-10-08 (×14): qty 237

## 2024-10-08 MED ORDER — IPRATROPIUM-ALBUTEROL 0.5-2.5 (3) MG/3ML IN SOLN
3.0000 mL | Freq: Once | RESPIRATORY_TRACT | Status: AC
  Administered 2024-10-08: 3 mL via RESPIRATORY_TRACT
  Filled 2024-10-08: qty 3

## 2024-10-08 MED ORDER — MELATONIN 3 MG PO TABS
6.0000 mg | ORAL_TABLET | Freq: Every evening | ORAL | Status: DC | PRN
  Administered 2024-10-14 – 2024-10-22 (×3): 6 mg via ORAL
  Filled 2024-10-08 (×3): qty 2

## 2024-10-08 MED ORDER — AZELASTINE HCL 0.1 % NA SOLN: 1.0000 | Freq: Every day | NASAL | Status: DC | PRN

## 2024-10-08 MED ORDER — APIXABAN 5 MG PO TABS
5.0000 mg | ORAL_TABLET | Freq: Two times a day (BID) | ORAL | Status: DC
  Administered 2024-10-09 – 2024-10-17 (×17): 5 mg via ORAL
  Filled 2024-10-08 (×16): qty 1

## 2024-10-08 MED ORDER — PANTOPRAZOLE SODIUM 40 MG PO TBEC
40.0000 mg | DELAYED_RELEASE_TABLET | Freq: Every day | ORAL | Status: DC
  Administered 2024-10-09 – 2024-10-26 (×17): 40 mg via ORAL
  Filled 2024-10-08 (×17): qty 1

## 2024-10-08 MED ORDER — MECLIZINE HCL 25 MG PO TABS: 25.0000 mg | ORAL_TABLET | Freq: Four times a day (QID) | ORAL | Status: DC | PRN

## 2024-10-08 MED ORDER — LEVOTHYROXINE SODIUM 100 MCG PO TABS
100.0000 ug | ORAL_TABLET | Freq: Every day | ORAL | Status: DC
  Administered 2024-10-09 – 2024-10-26 (×18): 100 ug via ORAL
  Filled 2024-10-08 (×18): qty 1

## 2024-10-08 MED ORDER — PROCHLORPERAZINE EDISYLATE 10 MG/2ML IJ SOLN
5.0000 mg | Freq: Four times a day (QID) | INTRAMUSCULAR | Status: DC | PRN
  Administered 2024-10-19 – 2024-10-25 (×3): 5 mg via INTRAVENOUS
  Filled 2024-10-08 (×4): qty 2

## 2024-10-08 NOTE — ED Triage Notes (Signed)
 Pt BIB [facility] from home c/o sudden shortness of breath. Per EMS pt was 96% on RA, pt placed on 2L [place] for comfort. Pt states she used to take lasix  but isnt sure how long she hasn't had it.   20G LAC

## 2024-10-08 NOTE — ED Provider Notes (Signed)
 " [place] EMERGENCY DEPARTMENT AT [facility] Provider Note   CSN: [id number] Arrival date & time: 10/08/24  2106     Patient presents with: Shortness of Breath   [name] is a 79 y.o. female.  {Add pertinent medical, surgical, social history, OB history to HPI:4267} 79 year old female presents for evaluation of shortness of breath.  She has a history of CHF and COPD.  States her symptoms started this evening.  States she has noticed more fluid on her legs over the last few days as well.  She takes torsemide .  Denies any chest pain or fevers or any other symptoms or concerns.   Shortness of Breath Associated symptoms: cough   Associated symptoms: no abdominal pain, no chest pain, no ear pain, no fever, no rash, no sore throat and no vomiting        Prior to Admission medications  Medication Sig Start Date End Date Taking? Authorizing Provider  acetaminophen  (TYLENOL ) 325 MG tablet Take 2 tablets (650 mg total) by mouth every 6 (six) hours as needed. Patient taking differently: Take 650 mg by mouth every 6 (six) hours as needed for mild pain (pain score 1-3). 01/20/24   [name], DO  albuterol  (VENTOLIN  HFA) 108 (90 Base) MCG/ACT inhaler Inhale 2 puffs into the lungs every 6 (six) hours as needed for wheezing or shortness of breath. 08/17/23   [provider]  amiodarone  (PACERONE ) 200 MG tablet Take 1 tablet (200 mg total) by mouth daily. 09/23/24   Mealor, [name], MD  apixaban  (ELIQUIS ) 5 MG TABS tablet Take 1 tablet (5 mg total) by mouth 2 (two) times daily. 07/26/24   Shahmehdi, [name], MD  atorvastatin  (LIPITOR) 40 MG tablet Take 1 tablet (40 mg total) by mouth daily. 07/05/24   [name], MD  azelastine  (ASTELIN ) 0.1 % nasal spray Place 1 spray into both nostrils 2 (two) times daily. Use in each nostril as directed Patient taking differently: Place 1 spray into both nostrils daily as needed for allergies. Use in each nostril as  directed 04/30/24   [name], NP  benzonatate  (TESSALON ) 100 MG capsule Take 1 capsule (100 mg total) by mouth 3 (three) times daily as needed for cough. 09/17/24   [name], DO  escitalopram  (LEXAPRO ) 10 MG tablet Take 10 mg by mouth daily with breakfast. 04/10/15   [provider]  feeding supplement (ENSURE PLUS HIGH PROTEIN) LIQD Take 237 mLs by mouth 2 (two) times daily between meals. 07/23/24   Shahmehdi, [name], MD  levothyroxine  (SYNTHROID ) 100 MCG tablet Take 100 mcg by mouth daily before breakfast.    [provider]  meclizine  (ANTIVERT ) 25 MG tablet Take 1 tablet (25 mg total) by mouth every 6 (six) hours as needed for dizziness. 08/15/24   [name], MD  metFORMIN  (GLUCOPHAGE ) 500 MG tablet Take 1 tablet (500 mg total) by mouth 2 (two) times daily with a meal. 08/03/13   Collins, Tillman CROME, PA-C  Multiple Vitamin (MULTIVITAMIN WITH MINERALS) TABS tablet Take 1 tablet by mouth in the morning.    [provider]  ondansetron  (ZOFRAN -ODT) 4 MG disintegrating tablet Take 4 mg by mouth every 6 (six) hours as needed for nausea or vomiting. 07/18/24   [provider]  pantoprazole  (PROTONIX ) 40 MG tablet TAKE 1 TABLET 30 TO 60 MINUTES BEFORE FIRST MEAL OF THE DAY. Patient taking differently: Take 40 mg by mouth daily before breakfast. 03/15/24  Wert, Michael B, MD  Polyethyl Glycol-Propyl Glycol (SYSTANE OP) Place 1 drop into both eyes daily as needed (dry eyes).    [provider]  potassium chloride  SA (KLOR-CON  M) 20 MEQ tablet Take 1 tablet (20 mEq total) by mouth daily. Patient taking differently: Take 20 mEq by mouth in the morning. 11/14/22 10/02/25  Rai, Nydia POUR, MD  torsemide  (DEMADEX ) 20 MG tablet Take 1 tablet (20 mg total) by mouth daily. 07/25/24 10/02/25  [name], MD  TRELEGY ELLIPTA  100-62.5-25 MCG/ACT AEPB Inhale 1 puff into the lungs daily. 06/25/24   [provider]    Allergies: Patient  has no known allergies.    Review of Systems  Constitutional:  Positive for fatigue. Negative for chills and fever.  HENT:  Negative for ear pain and sore throat.   Eyes:  Negative for pain and visual disturbance.  Respiratory:  Positive for cough and shortness of breath.   Cardiovascular:  Negative for chest pain and palpitations.  Gastrointestinal:  Negative for abdominal pain and vomiting.  Genitourinary:  Negative for dysuria and hematuria.  Musculoskeletal:  Negative for arthralgias and back pain.  Skin:  Negative for color change and rash.  Neurological:  Negative for seizures and syncope.  All other systems reviewed and are negative.   Updated Vital Signs BP 139/65   Pulse 73   Temp 98 F (36.7 C) (Oral)   Resp (!) 26   Ht 5' 2 (1.575 m)   Wt 69.2 kg   SpO2 100%   BMI 27.90 kg/m   Physical Exam Vitals and nursing note reviewed.  Constitutional:      General: She is not in acute distress.    Appearance: She is well-developed. She is not ill-appearing.  HENT:     Head: Normocephalic and atraumatic.  Eyes:     Conjunctiva/sclera: Conjunctivae normal.  Cardiovascular:     Rate and Rhythm: Normal rate and regular rhythm.     Heart sounds: No murmur heard. Pulmonary:     Effort: Pulmonary effort is normal. No respiratory distress.     Breath sounds: Decreased breath sounds present.  Abdominal:     Palpations: Abdomen is soft.     Tenderness: There is no abdominal tenderness.  Musculoskeletal:        General: No swelling.     Cervical back: Neck supple.     Right lower leg: Edema present.     Left lower leg: Edema present.  Skin:    General: Skin is warm and dry.     Capillary Refill: Capillary refill takes less than 2 seconds.  Neurological:     Mental Status: She is alert.  Psychiatric:        Mood and Affect: Mood normal.     (all labs ordered are listed, but only abnormal results are displayed) Labs Reviewed  CBC - Abnormal; Notable for the  following components:      Result Value   Hemoglobin 10.7 (*)    HCT 33.9 (*)    RDW 16.4 (*)    Platelets 143 (*)    All other components within normal limits  BASIC METABOLIC PANEL WITH GFR  PRO BRAIN NATRIURETIC PEPTIDE  TROPONIN T, HIGH SENSITIVITY    EKG: None  Radiology: DG Chest Portable 1 View Result Date: 10/08/2024 EXAM: 1 VIEW(S) XRAY OF THE CHEST 10/08/2024 09:39:00 PM COMPARISON: 09/11/2024 CLINICAL HISTORY: shortness of breath FINDINGS: LINES, TUBES AND DEVICES: Loop recorder device noted. LUNGS AND PLEURA: There  is mild central pulmonary vascular congestion/interstitial edema. There is some increasing hazy opacities in the right lower lobe, which may represent atelectasis or airspace disease. Stable right pleural effusion. No pneumothorax. HEART AND MEDIASTINUM: Stable cardiomegaly. Coronary artery stent in place. Cardiac valve prostheses in place. BONES AND SOFT TISSUES: Right chest wall surgical clips noted. Nondisplaced median sternotomy wires. No acute osseous abnormality. IMPRESSION: 1. Increasing hazy right lower lobe opacities, which may represent atelectasis or airspace disease. 2. Mild central pulmonary vascular congestion with interstitial edema. 3. Stable right pleural effusion. Electronically signed by: Greig Pique MD 10/08/2024 09:45 PM EST RP Workstation: [id number]    {Document cardiac monitor, telemetry assessment procedure when appropriate:32947} Procedures   Medications Ordered in the ED  ipratropium-albuterol  (DUONEB) 0.5-2.5 (3) MG/3ML nebulizer solution 3 mL (has no administration in time range)  furosemide  (LASIX ) injection 40 mg (has no administration in time range)      {Click here for ABCD2, HEART and other calculators REFRESH Note before signing:1}                              Medical Decision Making Amount and/or Complexity of Data Reviewed Labs: ordered. Radiology: ordered.  Risk Prescription drug management.   ***  {Document  critical care time when appropriate  Document review of labs and clinical decision tools ie CHADS2VASC2, etc  Document your independent review of radiology images and any outside records  Document your discussion with family members, caretakers and with consultants  Document social determinants of health affecting pt's care  Document your decision making why or why not admission, treatments were needed:32947:::1}   Final diagnoses:  None    ED Discharge Orders     None        "

## 2024-10-08 NOTE — H&P (Incomplete)
 " History and Physical  [name]:[id number] DOB: Oct 05, 1945 DOA: 10/08/2024  Referring physician: Dr. Gennaro, EDP  PCP: Sheryle Carwin, MD  Outpatient Specialists: Cardiology. Patient coming from: Home.    Chief Complaint: Shortness of breath.   HPI: [name] is a 79 y.o. female with medical history significant for paroxysmal A-fib on Eliquis , chronic HFpEF, prior CVA, carotid artery stenosis, valvular heart disease, CKD 3B, hypertension, hyperlipidemia, type 2 diabetes, hypothyroidism, COPD, presents to the ER with complaints of sudden onset shortness of breath, around 8 PM tonight.  Associated with bilateral lower extremity edema.  No reported anginal symptoms.  The patient was brought into the ER for further evaluation.    In the ER,      ED Course: ***  Review of Systems: Review of systems as noted in the HPI. All other systems reviewed and are negative.   Past Medical History:  Diagnosis Date   Anemia    Asthma 2010   Atrial fibrillation (HCC)    Atrial flutter (HCC)    Breast carcinoma (HCC) 1995   1995   Cardioembolic stroke (HCC) 01/2012   Right frontal in 01/2012; normal carotid ultrasound; possible LAA thrombus by TEE; virtual complete neurologic recovery   Carotid artery stenosis 1961   Chronic kidney disease, stage 2, mildly decreased GFR    GFR of approximately 60   Diabetes mellitus without complication (HCC) 5+ years ago   Controlled most of time   Diverticulosis of colon (without mention of hemorrhage) 2012   Dr. Golda   Fasting hyperglycemia    120 fasting   Gastroesophageal reflux disease    Gastroparesis    Heart failure with improved ejection fraction (HFimpEF) (HCC) 5+ years   Heart murmur 1961   Hemorrhoids    History of aortic valve replacement with bioprosthetic valve    History of mitral valve replacement with bioprosthetic valve    Hyperlipidemia    Hypertension    pt denies 05/30/13     Dr Alvan chester   Hyponatremia    Hypothyroidism    Rheumatic heart disease    Shortness of breath    Syncope and collapse February, 2024   Past Surgical History:  Procedure Laterality Date   AORTIC VALVE REPLACEMENT N/A 07/18/2013   Procedure: AORTIC VALVE REPLACEMENT (AVR);  Surgeon: Dorise MARLA Fellers, MD;  Location: [facility] OR;  Service: Open Heart Surgery;  Laterality: N/A;   BACK SURGERY     BREAST LUMPECTOMY Right 10/03/1993   CARDIAC CATHETERIZATION  5+ years   CARDIOVERSION N/A 07/26/2013   Procedure: CARDIOVERSION;  Surgeon: Vina LULLA Gull, MD;  Location: [facility] ENDOSCOPY;  Service: Cardiovascular;  Laterality: N/A;   CARDIOVERSION N/A 01/04/2023   Procedure: CARDIOVERSION;  Surgeon: Hobart Powell BRAVO, MD;  Location: [facility] ENDOSCOPY;  Service: Cardiovascular;  Laterality: N/A;   CARDIOVERSION N/A 02/23/2024   Procedure: CARDIOVERSION;  Surgeon: [name], MD;  Location: [facility] ORS;  Service: Endoscopy;  Laterality: N/A;   COLONOSCOPY  10/03/2010   Negative screening procedure   DILATION AND CURETTAGE OF UTERUS     ESOPHAGEAL MANOMETRY N/A 06/17/2013   Procedure: ESOPHAGEAL MANOMETRY (EM);  Surgeon: Alm JONELLE Gander, MD;  Location: [facility] ENDOSCOPY;  Service: Endoscopy;  Laterality: N/A;   INTRAOPERATIVE TRANSESOPHAGEAL ECHOCARDIOGRAM N/A 07/18/2013   Procedure: INTRAOPERATIVE TRANSESOPHAGEAL ECHOCARDIOGRAM;  Surgeon: Dorise MARLA Fellers, MD;  Location: [facility] OR;  Service: Open Heart Surgery;  Laterality: N/A;   LEFT AND RIGHT HEART CATHETERIZATION WITH CORONARY ANGIOGRAM N/A  07/10/2013   Procedure: LEFT AND RIGHT HEART CATHETERIZATION WITH CORONARY ANGIOGRAM;  Surgeon: Lonni JONETTA Cash, MD;  Location: [facility] CATH LAB;  Service: Cardiovascular;  Laterality: N/A;   LOOP RECORDER INSERTION N/A 07/02/2024   Procedure: LOOP RECORDER INSERTION;  Surgeon: Nancey Eulas BRAVO, MD;  Location: [facility] INVASIVE CV LAB;  Service: Cardiovascular;  Laterality: N/A;   LUMBAR LAMINECTOMY/DECOMPRESSION  MICRODISCECTOMY Right 05/13/2014   Procedure: LUMBAR LAMINECTOMY/DECOMPRESSION MICRODISCECTOMY 1 LEVEL  lumbar four/five;  Surgeon: Darina MALVA Boehringer, MD;  Location: [facility] NEURO ORS;  Service: Neurosurgery;  Laterality: Right;   MITRAL VALVE REPLACEMENT N/A 07/18/2013   Procedure: MITRAL VALVE (MV) REPLACEMENT;  Surgeon: Dorise MARLA Fellers, MD;  Location: [facility] OR;  Service: Open Heart Surgery;  Laterality: N/A;   MITRAL VALVE SURGERY  10/03/1960   [facility], closed mitral valvulotomy by finger fracture   TEE WITHOUT CARDIOVERSION  01/24/2012   Procedure: TRANSESOPHAGEAL ECHOCARDIOGRAM (TEE);  Surgeon: Redell GORMAN Shallow, MD;  Location: [facility] ENDOSCOPY;  Service: Cardiovascular;  Laterality: N/A;   TEE WITHOUT CARDIOVERSION N/A 07/11/2013   Procedure: TRANSESOPHAGEAL ECHOCARDIOGRAM (TEE);  Surgeon: Aleene JINNY Passe, MD;  Location: [facility] ENDOSCOPY;  Service: Cardiovascular;  Laterality: N/A;   TEE WITHOUT CARDIOVERSION N/A 07/26/2013   Procedure: TRANSESOPHAGEAL ECHOCARDIOGRAM (TEE);  Surgeon: Vina LULLA Gull, MD;  Location: [facility] ENDOSCOPY;  Service: Cardiovascular;  Laterality: N/A;   TUBAL LIGATION  10/04/1971    Social History:  reports that she has never smoked. She has never used smokeless tobacco. She reports that she does not drink alcohol and does not use drugs.   Allergies[1]  Family History  Problem Relation Age of Onset   Heart disease Brother 61       MI   Rheum arthritis Maternal Grandmother    Asthma Maternal Grandfather    Hypothyroidism Mother    Diabetes Sister    Cirrhosis Father     ***  Prior to Admission medications  Medication Sig Start Date End Date Taking? Authorizing Provider  acetaminophen  (TYLENOL ) 325 MG tablet Take 2 tablets (650 mg total) by mouth every 6 (six) hours as needed. Patient taking differently: Take 650 mg by mouth every 6 (six) hours as needed for mild pain (pain score 1-3). 01/20/24   [name], DO  albuterol  (VENTOLIN  HFA) 108 (90 Base) MCG/ACT  inhaler Inhale 2 puffs into the lungs every 6 (six) hours as needed for wheezing or shortness of breath. 08/17/23   [provider]  amiodarone  (PACERONE ) 200 MG tablet Take 1 tablet (200 mg total) by mouth daily. 09/23/24   Mealor, [name], MD  apixaban  (ELIQUIS ) 5 MG TABS tablet Take 1 tablet (5 mg total) by mouth 2 (two) times daily. 07/26/24   Shahmehdi, [name], MD  atorvastatin  (LIPITOR) 40 MG tablet Take 1 tablet (40 mg total) by mouth daily. 07/05/24   [name], MD  azelastine  (ASTELIN ) 0.1 % nasal spray Place 1 spray into both nostrils 2 (two) times daily. Use in each nostril as directed Patient taking differently: Place 1 spray into both nostrils daily as needed for allergies. Use in each nostril as directed 04/30/24   [name], NP  benzonatate  (TESSALON ) 100 MG capsule Take 1 capsule (100 mg total) by mouth 3 (three) times daily as needed for cough. 09/17/24   [name], DO  escitalopram  (LEXAPRO ) 10 MG tablet Take 10 mg by mouth daily with breakfast. 04/10/15   [provider]  feeding supplement (ENSURE PLUS HIGH PROTEIN) LIQD Take 237  mLs by mouth 2 (two) times daily between meals. 07/23/24   Shahmehdi, [name], MD  levothyroxine  (SYNTHROID ) 100 MCG tablet Take 100 mcg by mouth daily before breakfast.    [provider]  meclizine  (ANTIVERT ) 25 MG tablet Take 1 tablet (25 mg total) by mouth every 6 (six) hours as needed for dizziness. 08/15/24   [name], MD  metFORMIN  (GLUCOPHAGE ) 500 MG tablet Take 1 tablet (500 mg total) by mouth 2 (two) times daily with a meal. 08/03/13   Collins, Tillman CROME, PA-C  Multiple Vitamin (MULTIVITAMIN WITH MINERALS) TABS tablet Take 1 tablet by mouth in the morning.    [provider]  ondansetron  (ZOFRAN -ODT) 4 MG disintegrating tablet Take 4 mg by mouth every 6 (six) hours as needed for nausea or vomiting. 07/18/24   [provider]  pantoprazole  (PROTONIX ) 40 MG tablet TAKE 1  TABLET 30 TO 60 MINUTES BEFORE FIRST MEAL OF THE DAY. Patient taking differently: Take 40 mg by mouth daily before breakfast. 03/15/24   Darlean Ozell NOVAK, MD  Polyethyl Glycol-Propyl Glycol (SYSTANE OP) Place 1 drop into both eyes daily as needed (dry eyes).    [provider]  potassium chloride  SA (KLOR-CON  M) 20 MEQ tablet Take 1 tablet (20 mEq total) by mouth daily. Patient taking differently: Take 20 mEq by mouth in the morning. 11/14/22 10/02/25  Rai, Nydia POUR, MD  torsemide  (DEMADEX ) 20 MG tablet Take 1 tablet (20 mg total) by mouth daily. 07/25/24 10/02/25  [name], MD  TRELEGY ELLIPTA  100-62.5-25 MCG/ACT AEPB Inhale 1 puff into the lungs daily. 06/25/24   [provider]    Physical Exam: BP 127/60   Pulse 72   Temp 98 F (36.7 C) (Oral)   Resp (!) 21   Ht 5' 2 (1.575 m)   Wt 69.2 kg   SpO2 95%   BMI 27.90 kg/m   General: 79 y.o. year-old female well developed well nourished in no acute distress.  Alert and oriented x3. Cardiovascular: Regular rate and rhythm with no rubs or gallops.  No thyromegaly or JVD noted.  No lower extremity edema. 2/4 pulses in all 4 extremities. Respiratory: Clear to auscultation with no wheezes or rales. Good inspiratory effort. Abdomen: Soft nontender nondistended with normal bowel sounds x4 quadrants. Muskuloskeletal: No cyanosis, clubbing or edema noted bilaterally Neuro: CN II-XII intact, strength, sensation, reflexes Skin: No ulcerative lesions noted or rashes Psychiatry: Judgement and insight appear normal. Mood is appropriate for condition and setting          Labs on Admission:  Basic Metabolic Panel: Recent Labs  Lab 10/08/24 2147  NA 133*  K 3.8  CL 91*  CO2 30  GLUCOSE 122*  BUN 16  CREATININE 1.55*  CALCIUM  8.4*   Liver Function Tests: No results for input(s): AST, ALT, ALKPHOS, BILITOT, PROT, ALBUMIN  in the last 168 hours. No results for input(s): LIPASE, AMYLASE in the last  168 hours. No results for input(s): AMMONIA in the last 168 hours. CBC: Recent Labs  Lab 10/08/24 2147  WBC 5.7  HGB 10.7*  HCT 33.9*  MCV 85.6  PLT 143*   Cardiac Enzymes: No results for input(s): CKTOTAL, CKMB, CKMBINDEX, TROPONINI in the last 168 hours.  BNP (last 3 results) Recent Labs    01/25/24 2031 03/14/24 1016 05/30/24 0727  BNP 775.0* 503.0* 528.0*    ProBNP (last 3 results) Recent Labs    07/24/24 0438 09/11/24 1824 10/08/24 2147  PROBNP 2,155.0* 5,666.0*  5,574.0*    CBG: No results for input(s): GLUCAP in the last 168 hours.  Radiological Exams on Admission: DG Chest Portable 1 View Result Date: 10/08/2024 EXAM: 1 VIEW(S) XRAY OF THE CHEST 10/08/2024 09:39:00 PM COMPARISON: 09/11/2024 CLINICAL HISTORY: shortness of breath FINDINGS: LINES, TUBES AND DEVICES: Loop recorder device noted. LUNGS AND PLEURA: There is mild central pulmonary vascular congestion/interstitial edema. There is some increasing hazy opacities in the right lower lobe, which may represent atelectasis or airspace disease. Stable right pleural effusion. No pneumothorax. HEART AND MEDIASTINUM: Stable cardiomegaly. Coronary artery stent in place. Cardiac valve prostheses in place. BONES AND SOFT TISSUES: Right chest wall surgical clips noted. Nondisplaced median sternotomy wires. No acute osseous abnormality. IMPRESSION: 1. Increasing hazy right lower lobe opacities, which may represent atelectasis or airspace disease. 2. Mild central pulmonary vascular congestion with interstitial edema. 3. Stable right pleural effusion. Electronically signed by: Greig Pique MD 10/08/2024 09:45 PM EST RP Workstation: [id number]    EKG: I independently viewed the EKG done and my findings are as followed: ***   Assessment/Plan Present on Admission:  Acute on chronic diastolic (congestive) heart failure (HCC)  Principal Problem:   Acute on chronic diastolic (congestive) heart failure (HCC)   DVT  prophylaxis: ***   Code Status: ***   Family Communication: ***   Disposition Plan: ***   Consults called: ***   Admission status: ***    Status is: Inpatient {Inpatient:23812}   Terry LOISE Hurst MD [facility] Pager [phone]  If 7PM-7AM, please contact night-coverage [email or web address] Password [email or web address]  10/08/2024, 11:16 PM        [1] No Known Allergies "

## 2024-10-08 NOTE — ED Notes (Signed)
 Put pt on purewick, receiving Lasix 

## 2024-10-08 NOTE — H&P (Incomplete)
 " History and Physical  [name] FMW:[id number] DOB: 1946/05/23 DOA: 10/08/2024  Referring physician: Dr. Gennaro, EDP  PCP: Sheryle Carwin, MD  Outpatient Specialists: Cardiology. Patient coming from: Home.    Chief Complaint: Shortness of breath.   HPI: [name] is a 78 y.o. female with medical history significant for paroxysmal A-fib on Eliquis , chronic HFpEF, prior CVA, carotid artery stenosis, valvular heart disease, CKD 3B, hypertension, hyperlipidemia, type 2 diabetes, hypothyroidism, COPD, presents to the ER with complaints of sudden onset shortness of breath, around 8 PM tonight.  Associated with bilateral lower extremity edema.  No reported anginal symptoms.  The patient was brought into the ER for further evaluation.    In the ER, proBNP 5574, high-sensitivity troponin 42.  Chest x-ray showing cardiomegaly and pulmonary edema.  The patient received IV Lasix  40 mg x 1 in the ER.  TRH, hospitalist service, was asked to admit for acute on chronic HFpEF.  ED Course: Temperature 98.  BP 129/75, pulse 72, respiratory 20, saturation 99% on 1 L nasal cannula.  At the time of this visit, the patient is alert, endorses dysuria.  UA and urine culture ordered and are pending.  Review of Systems: Review of systems as noted in the HPI. All other systems reviewed and are negative.   Past Medical History:  Diagnosis Date   Anemia    Asthma 2010   Atrial fibrillation (HCC)    Atrial flutter (HCC)    Breast carcinoma (HCC) 1995   1995   Cardioembolic stroke (HCC) 01/2012   Right frontal in 01/2012; normal carotid ultrasound; possible LAA thrombus by TEE; virtual complete neurologic recovery   Carotid artery stenosis 1961   Chronic kidney disease, stage 2, mildly decreased GFR    GFR of approximately 60   Diabetes mellitus without complication (HCC) 5+ years ago   Controlled most of time   Diverticulosis of colon (without mention of hemorrhage) 2012   Dr. Golda   Fasting  hyperglycemia    120 fasting   Gastroesophageal reflux disease    Gastroparesis    Heart failure with improved ejection fraction (HFimpEF) (HCC) 5+ years   Heart murmur 1961   Hemorrhoids    History of aortic valve replacement with bioprosthetic valve    History of mitral valve replacement with bioprosthetic valve    Hyperlipidemia    Hypertension    pt denies 05/30/13     Dr Alvan chester   Hyponatremia    Hypothyroidism    Rheumatic heart disease    Shortness of breath    Syncope and collapse February, 2024   Past Surgical History:  Procedure Laterality Date   AORTIC VALVE REPLACEMENT N/A 07/18/2013   Procedure: AORTIC VALVE REPLACEMENT (AVR);  Surgeon: Dorise MARLA Fellers, MD;  Location: [facility] OR;  Service: Open Heart Surgery;  Laterality: N/A;   BACK SURGERY     BREAST LUMPECTOMY Right 10/03/1993   CARDIAC CATHETERIZATION  5+ years   CARDIOVERSION N/A 07/26/2013   Procedure: CARDIOVERSION;  Surgeon: Vina LULLA Gull, MD;  Location: [facility] ENDOSCOPY;  Service: Cardiovascular;  Laterality: N/A;   CARDIOVERSION N/A 01/04/2023   Procedure: CARDIOVERSION;  Surgeon: Hobart Powell BRAVO, MD;  Location: [facility] ENDOSCOPY;  Service: Cardiovascular;  Laterality: N/A;   CARDIOVERSION N/A 02/23/2024   Procedure: CARDIOVERSION;  Surgeon: [name], MD;  Location: [facility] ORS;  Service: Endoscopy;  Laterality: N/A;   COLONOSCOPY  10/03/2010   Negative screening procedure   DILATION AND CURETTAGE OF UTERUS  ESOPHAGEAL MANOMETRY N/A 06/17/2013   Procedure: ESOPHAGEAL MANOMETRY (EM);  Surgeon: Alm JONELLE Gander, MD;  Location: [facility] ENDOSCOPY;  Service: Endoscopy;  Laterality: N/A;   INTRAOPERATIVE TRANSESOPHAGEAL ECHOCARDIOGRAM N/A 07/18/2013   Procedure: INTRAOPERATIVE TRANSESOPHAGEAL ECHOCARDIOGRAM;  Surgeon: Dorise MARLA Fellers, MD;  Location: [facility] OR;  Service: Open Heart Surgery;  Laterality: N/A;   LEFT AND RIGHT HEART CATHETERIZATION WITH CORONARY ANGIOGRAM N/A 07/10/2013   Procedure: LEFT AND RIGHT  HEART CATHETERIZATION WITH CORONARY ANGIOGRAM;  Surgeon: Lonni JONETTA Cash, MD;  Location: [facility] CATH LAB;  Service: Cardiovascular;  Laterality: N/A;   LOOP RECORDER INSERTION N/A 07/02/2024   Procedure: LOOP RECORDER INSERTION;  Surgeon: Nancey Eulas BRAVO, MD;  Location: [facility] INVASIVE CV LAB;  Service: Cardiovascular;  Laterality: N/A;   LUMBAR LAMINECTOMY/DECOMPRESSION MICRODISCECTOMY Right 05/13/2014   Procedure: LUMBAR LAMINECTOMY/DECOMPRESSION MICRODISCECTOMY 1 LEVEL  lumbar four/five;  Surgeon: Darina MALVA Boehringer, MD;  Location: [facility] NEURO ORS;  Service: Neurosurgery;  Laterality: Right;   MITRAL VALVE REPLACEMENT N/A 07/18/2013   Procedure: MITRAL VALVE (MV) REPLACEMENT;  Surgeon: Dorise MARLA Fellers, MD;  Location: [facility] OR;  Service: Open Heart Surgery;  Laterality: N/A;   MITRAL VALVE SURGERY  10/03/1960   [facility], closed mitral valvulotomy by finger fracture   TEE WITHOUT CARDIOVERSION  01/24/2012   Procedure: TRANSESOPHAGEAL ECHOCARDIOGRAM (TEE);  Surgeon: Redell GORMAN Shallow, MD;  Location: [facility] ENDOSCOPY;  Service: Cardiovascular;  Laterality: N/A;   TEE WITHOUT CARDIOVERSION N/A 07/11/2013   Procedure: TRANSESOPHAGEAL ECHOCARDIOGRAM (TEE);  Surgeon: Aleene JINNY Passe, MD;  Location: [facility] ENDOSCOPY;  Service: Cardiovascular;  Laterality: N/A;   TEE WITHOUT CARDIOVERSION N/A 07/26/2013   Procedure: TRANSESOPHAGEAL ECHOCARDIOGRAM (TEE);  Surgeon: Vina LULLA Gull, MD;  Location: [facility] ENDOSCOPY;  Service: Cardiovascular;  Laterality: N/A;   TUBAL LIGATION  10/04/1971    Social History:  reports that she has never smoked. She has never used smokeless tobacco. She reports that she does not drink alcohol and does not use drugs.   Allergies[1]  Family History  Problem Relation Age of Onset   Heart disease Brother 56       MI   Rheum arthritis Maternal Grandmother    Asthma Maternal Grandfather    Hypothyroidism Mother    Diabetes Sister    Cirrhosis Father       Prior to Admission medications   Medication Sig Start Date End Date Taking? Authorizing Provider  acetaminophen  (TYLENOL ) 325 MG tablet Take 2 tablets (650 mg total) by mouth every 6 (six) hours as needed. Patient taking differently: Take 650 mg by mouth every 6 (six) hours as needed for mild pain (pain score 1-3). 01/20/24   [name], DO  albuterol  (VENTOLIN  HFA) 108 (90 Base) MCG/ACT inhaler Inhale 2 puffs into the lungs every 6 (six) hours as needed for wheezing or shortness of breath. 08/17/23   [provider]  amiodarone  (PACERONE ) 200 MG tablet Take 1 tablet (200 mg total) by mouth daily. 09/23/24   Mealor, [name], MD  apixaban  (ELIQUIS ) 5 MG TABS tablet Take 1 tablet (5 mg total) by mouth 2 (two) times daily. 07/26/24   Shahmehdi, [name], MD  atorvastatin  (LIPITOR) 40 MG tablet Take 1 tablet (40 mg total) by mouth daily. 07/05/24   [name], MD  azelastine  (ASTELIN ) 0.1 % nasal spray Place 1 spray into both nostrils 2 (two) times daily. Use in each nostril as directed Patient taking differently: Place 1 spray into both nostrils daily as needed for allergies. Use in each nostril  as directed 04/30/24   [name], NP  benzonatate  (TESSALON ) 100 MG capsule Take 1 capsule (100 mg total) by mouth 3 (three) times daily as needed for cough. 09/17/24   [name], DO  escitalopram  (LEXAPRO ) 10 MG tablet Take 10 mg by mouth daily with breakfast. 04/10/15   [provider]  feeding supplement (ENSURE PLUS HIGH PROTEIN) LIQD Take 237 mLs by mouth 2 (two) times daily between meals. 07/23/24   ShahmehdiAdriana LABOR, MD  levothyroxine  (SYNTHROID ) 100 MCG tablet Take 100 mcg by mouth daily before breakfast.    [provider]  meclizine  (ANTIVERT ) 25 MG tablet Take 1 tablet (25 mg total) by mouth every 6 (six) hours as needed for dizziness. 08/15/24   [name], MD  metFORMIN  (GLUCOPHAGE ) 500 MG tablet Take 1 tablet (500 mg total) by mouth 2 (two) times daily with a meal.  08/03/13   Collins, Tillman CROME, PA-C  Multiple Vitamin (MULTIVITAMIN WITH MINERALS) TABS tablet Take 1 tablet by mouth in the morning.    [provider]  ondansetron  (ZOFRAN -ODT) 4 MG disintegrating tablet Take 4 mg by mouth every 6 (six) hours as needed for nausea or vomiting. 07/18/24   [provider]  pantoprazole  (PROTONIX ) 40 MG tablet TAKE 1 TABLET 30 TO 60 MINUTES BEFORE FIRST MEAL OF THE DAY. Patient taking differently: Take 40 mg by mouth daily before breakfast. 03/15/24   Darlean Ozell NOVAK, MD  Polyethyl Glycol-Propyl Glycol (SYSTANE OP) Place 1 drop into both eyes daily as needed (dry eyes).    [provider]  potassium chloride  SA (KLOR-CON  M) 20 MEQ tablet Take 1 tablet (20 mEq total) by mouth daily. Patient taking differently: Take 20 mEq by mouth in the morning. 11/14/22 10/02/25  Rai, Nydia POUR, MD  torsemide  (DEMADEX ) 20 MG tablet Take 1 tablet (20 mg total) by mouth daily. 07/25/24 10/02/25  [name], MD  TRELEGY ELLIPTA  100-62.5-25 MCG/ACT AEPB Inhale 1 puff into the lungs daily. 06/25/24   [provider]    Physical Exam: BP 127/60   Pulse 72   Temp 98 F (36.7 C) (Oral)   Resp (!) 21   Ht 5' 2 (1.575 m)   Wt 69.2 kg   SpO2 95%   BMI 27.90 kg/m   General: 80 y.o. year-old female well developed well nourished in no acute distress.  Alert and minimally verbal. Cardiovascular: Regular rate and rhythm with no rubs or gallops.  No thyromegaly or JVD noted.  2+ pitting edema in lower extremities bilaterally. Respiratory: Faint diffuse rales bilaterally. Good inspiratory effort. Abdomen: Soft nontender nondistended with normal bowel sounds x4 quadrants. Muskuloskeletal: No cyanosis or clubbing noted bilaterally Neuro: CN II-XII intact, strength, sensation, reflexes Skin: No ulcerative lesions noted or rashes Psychiatry: Mood is appropriate for condition and setting          Labs on Admission:  Basic Metabolic Panel: Recent  Labs  Lab 10/08/24 2147  NA 133*  K 3.8  CL 91*  CO2 30  GLUCOSE 122*  BUN 16  CREATININE 1.55*  CALCIUM  8.4*   Liver Function Tests: No results for input(s): AST, ALT, ALKPHOS, BILITOT, PROT, ALBUMIN  in the last 168 hours. No results for input(s): LIPASE, AMYLASE in the last 168 hours. No results for input(s): AMMONIA in the last 168 hours. CBC: Recent Labs  Lab 10/08/24 2147  WBC 5.7  HGB 10.7*  HCT 33.9*  MCV 85.6  PLT 143*   Cardiac Enzymes: No  results for input(s): CKTOTAL, CKMB, CKMBINDEX, TROPONINI in the last 168 hours.  BNP (last 3 results) Recent Labs    01/25/24 2031 03/14/24 1016 05/30/24 0727  BNP 775.0* 503.0* 528.0*    ProBNP (last 3 results) Recent Labs    07/24/24 0438 09/11/24 1824 10/08/24 2147  PROBNP 2,155.0* 5,666.0* 5,574.0*    CBG: No results for input(s): GLUCAP in the last 168 hours.  Radiological Exams on Admission: DG Chest Portable 1 View Result Date: 10/08/2024 EXAM: 1 VIEW(S) XRAY OF THE CHEST 10/08/2024 09:39:00 PM COMPARISON: 09/11/2024 CLINICAL HISTORY: shortness of breath FINDINGS: LINES, TUBES AND DEVICES: Loop recorder device noted. LUNGS AND PLEURA: There is mild central pulmonary vascular congestion/interstitial edema. There is some increasing hazy opacities in the right lower lobe, which may represent atelectasis or airspace disease. Stable right pleural effusion. No pneumothorax. HEART AND MEDIASTINUM: Stable cardiomegaly. Coronary artery stent in place. Cardiac valve prostheses in place. BONES AND SOFT TISSUES: Right chest wall surgical clips noted. Nondisplaced median sternotomy wires. No acute osseous abnormality. IMPRESSION: 1. Increasing hazy right lower lobe opacities, which may represent atelectasis or airspace disease. 2. Mild central pulmonary vascular congestion with interstitial edema. 3. Stable right pleural effusion. Electronically signed by: Greig Pique MD 10/08/2024 09:45 PM EST RP  Workstation: [id number]    EKG: I independently viewed the EKG done and my findings are as followed: Sinus rhythm rate of 71.  QTc 594.  Assessment/Plan Present on Admission:  Acute on chronic diastolic (congestive) heart failure (HCC)  Principal Problem:   Acute on chronic diastolic (congestive) heart failure (HCC)  Acute on chronic HFpEF Last echo done on 07/22/2024 revealed LVEF 60 to 65% Continue IV diuresing Monitor urine output Continue to monitor strict I's and O's and daily weight  Dysuria UA and urine culture are pending Treat if UA is positive for pyuria.  Paroxysmal A-fib on Eliquis  Resume home Eliquis  Currently rate controlled Continue to monitor on telemetry  Hypothyroidism Resume home levothyroxine   Hyperlipidemia Resume home Lipitor.  GERD Resume home PPI.  COPD Resume home regimen.  Prolonged QTc 12-lead EKG with QTc of 594 Avoid QTc prolonging agents Optimize magnesium  and potassium levels Monitor on telemetry.  Generalized weakness PT OT evaluation Fall precautions.   Time: 75 minutes.   DVT prophylaxis: Home Eliquis  twice daily  Code Status: DNR/DNI.  Family Communication: Updated the patient's husband at bedside.  Disposition Plan: Admitted to telemetry unit.  Consults called: None.  Admission status: Inpatient status.   Status is: Inpatient The patient requires at least 2 midnights for further evaluation and treatment of present condition.   Terry LOISE Hurst MD [facility] Pager [phone]  If 7PM-7AM, please contact night-coverage [email or web address] Password [email or web address]  10/08/2024, 11:16 PM      [1] No Known Allergies  "

## 2024-10-09 ENCOUNTER — Other Ambulatory Visit (HOSPITAL_COMMUNITY): Payer: Self-pay

## 2024-10-09 ENCOUNTER — Telehealth (HOSPITAL_COMMUNITY): Payer: Self-pay

## 2024-10-09 ENCOUNTER — Telehealth: Payer: Self-pay

## 2024-10-09 DIAGNOSIS — N1832 Chronic kidney disease, stage 3b: Secondary | ICD-10-CM

## 2024-10-09 DIAGNOSIS — I5033 Acute on chronic diastolic (congestive) heart failure: Secondary | ICD-10-CM

## 2024-10-09 DIAGNOSIS — I480 Paroxysmal atrial fibrillation: Secondary | ICD-10-CM

## 2024-10-09 DIAGNOSIS — J9601 Acute respiratory failure with hypoxia: Secondary | ICD-10-CM

## 2024-10-09 DIAGNOSIS — J441 Chronic obstructive pulmonary disease with (acute) exacerbation: Secondary | ICD-10-CM | POA: Diagnosis not present

## 2024-10-09 LAB — PHOSPHORUS: Phosphorus: 2.5 mg/dL (ref 2.5–4.6)

## 2024-10-09 LAB — URINALYSIS, W/ REFLEX TO CULTURE (INFECTION SUSPECTED)
Bilirubin Urine: NEGATIVE
Glucose, UA: NEGATIVE mg/dL
Hgb urine dipstick: NEGATIVE
Ketones, ur: NEGATIVE mg/dL
Leukocytes,Ua: NEGATIVE
Nitrite: NEGATIVE
Protein, ur: NEGATIVE mg/dL
Specific Gravity, Urine: 1.006 (ref 1.005–1.030)
pH: 7 (ref 5.0–8.0)

## 2024-10-09 LAB — RESPIRATORY PANEL BY PCR

## 2024-10-09 LAB — RESP PANEL BY RT-PCR (RSV, FLU A&B, COVID)  RVPGX2
Influenza A by PCR: NEGATIVE
Influenza B by PCR: NEGATIVE
Resp Syncytial Virus by PCR: NEGATIVE
SARS Coronavirus 2 by RT PCR: NEGATIVE

## 2024-10-09 LAB — BASIC METABOLIC PANEL WITH GFR
Anion gap: 9 (ref 5–15)
BUN: 15 mg/dL (ref 8–23)
CO2: 33 mmol/L — ABNORMAL HIGH (ref 22–32)
Calcium: 8 mg/dL — ABNORMAL LOW (ref 8.9–10.3)
Chloride: 94 mmol/L — ABNORMAL LOW (ref 98–111)
Creatinine, Ser: 1.48 mg/dL — ABNORMAL HIGH (ref 0.44–1.00)
GFR, Estimated: 35 mL/min — ABNORMAL LOW
Glucose, Bld: 97 mg/dL (ref 70–99)
Potassium: 3.1 mmol/L — ABNORMAL LOW (ref 3.5–5.1)
Sodium: 136 mmol/L (ref 135–145)

## 2024-10-09 LAB — CBC
HCT: 34.2 % — ABNORMAL LOW (ref 36.0–46.0)
Hemoglobin: 10.6 g/dL — ABNORMAL LOW (ref 12.0–15.0)
MCH: 26.3 pg (ref 26.0–34.0)
MCHC: 31 g/dL (ref 30.0–36.0)
MCV: 84.9 fL (ref 80.0–100.0)
Platelets: 138 K/uL — ABNORMAL LOW (ref 150–400)
RBC: 4.03 MIL/uL (ref 3.87–5.11)
RDW: 16.3 % — ABNORMAL HIGH (ref 11.5–15.5)
WBC: 5.4 K/uL (ref 4.0–10.5)
nRBC: 0 % (ref 0.0–0.2)

## 2024-10-09 LAB — PROCALCITONIN: Procalcitonin: <0.1 ng/mL

## 2024-10-09 LAB — MAGNESIUM: Magnesium: 1.3 mg/dL — ABNORMAL LOW (ref 1.7–2.4)

## 2024-10-09 LAB — TROPONIN T, HIGH SENSITIVITY: Troponin T High Sensitivity: 41 ng/L — ABNORMAL HIGH (ref 0–19)

## 2024-10-09 LAB — D-DIMER, QUANTITATIVE: D-Dimer, Quant: 2.26 ug{FEU}/mL — ABNORMAL HIGH (ref 0.00–0.50)

## 2024-10-09 MED ORDER — METHYLPREDNISOLONE SODIUM SUCC 40 MG IJ SOLR
40.0000 mg | Freq: Two times a day (BID) | INTRAMUSCULAR | Status: DC
  Administered 2024-10-09 – 2024-10-11 (×5): 40 mg via INTRAVENOUS
  Filled 2024-10-09 (×5): qty 1

## 2024-10-09 MED ORDER — MAGNESIUM SULFATE 2 GM/50ML IV SOLN
2.0000 g | Freq: Once | INTRAVENOUS | Status: AC
  Administered 2024-10-09: 2 g via INTRAVENOUS
  Filled 2024-10-09: qty 50

## 2024-10-09 MED ORDER — ARFORMOTEROL TARTRATE 15 MCG/2ML IN NEBU: 15.0000 ug | INHALATION_SOLUTION | Freq: Two times a day (BID) | RESPIRATORY_TRACT | Status: DC

## 2024-10-09 MED ORDER — POTASSIUM CHLORIDE CRYS ER 20 MEQ PO TBCR
20.0000 meq | EXTENDED_RELEASE_TABLET | Freq: Three times a day (TID) | ORAL | Status: AC
  Administered 2024-10-09 (×3): 20 meq via ORAL
  Filled 2024-10-09 (×3): qty 1

## 2024-10-09 MED ORDER — BUDESON-GLYCOPYRROL-FORMOTEROL 160-9-4.8 MCG/ACT IN AERO
2.0000 | INHALATION_SPRAY | Freq: Two times a day (BID) | RESPIRATORY_TRACT | Status: DC
  Administered 2024-10-09 – 2024-10-26 (×33): 2 via RESPIRATORY_TRACT
  Filled 2024-10-09 (×3): qty 5.9

## 2024-10-09 MED ORDER — IPRATROPIUM-ALBUTEROL 0.5-2.5 (3) MG/3ML IN SOLN
3.0000 mL | Freq: Three times a day (TID) | RESPIRATORY_TRACT | Status: DC
  Administered 2024-10-09 – 2024-10-12 (×11): 3 mL via RESPIRATORY_TRACT
  Filled 2024-10-09 (×11): qty 3

## 2024-10-09 MED ORDER — BUDESONIDE 0.5 MG/2ML IN SUSP: 0.5000 mg | Freq: Two times a day (BID) | RESPIRATORY_TRACT | Status: DC

## 2024-10-09 MED ORDER — FUROSEMIDE 10 MG/ML IJ SOLN
20.0000 mg | Freq: Two times a day (BID) | INTRAMUSCULAR | Status: AC
  Administered 2024-10-09 (×2): 20 mg via INTRAVENOUS
  Filled 2024-10-09 (×2): qty 2

## 2024-10-09 NOTE — Plan of Care (Signed)
" °  Problem: Acute Rehab OT Goals (only OT should resolve) Goal: Pt. Will Perform Grooming Flowsheets (Taken 10/09/2024 1603) Pt Will Perform Grooming:  Independently  standing Goal: Pt. Will Perform Upper Body Bathing Flowsheets (Taken 10/09/2024 1603) Pt Will Perform Upper Body Bathing:  Independently  sitting Goal: Pt. Will Perform Lower Body Dressing Flowsheets (Taken 10/09/2024 1603) Pt Will Perform Lower Body Dressing:  Independently  sitting/lateral leans Goal: Pt. Will Transfer To Toilet Flowsheets (Taken 10/09/2024 1603) Pt Will Transfer to Toilet: with modified independence Goal: Pt/Caregiver Will Perform Home Exercise Program Flowsheets (Taken 10/09/2024 1603) Pt/caregiver will Perform Home Exercise Program:  Increased strength  Both right and left upper extremity  With written HEP provided    Chiquita Sermon, OTR/L  "

## 2024-10-09 NOTE — Evaluation (Signed)
 Occupational Therapy Evaluation Patient Details Name: [name] MRN: [id number] DOB: [birth year] Today's Date: 10/09/2024   History of Present Illness   [name] is a 79 y.o. female with medical history significant for paroxysmal A-fib on Eliquis , chronic HFpEF, prior CVA, carotid artery stenosis, valvular heart disease, CKD 3B, hypertension, hyperlipidemia, type 2 diabetes, hypothyroidism, COPD, presents to the ER with complaints of sudden onset shortness of breath, around 8 PM tonight.  Associated with bilateral lower extremity edema.  No reported anginal symptoms.  The patient was brought into the ER for further evaluation.     Clinical Impressions Pt agreeable to OT evaluation. Husband in room throughout evaluation. Pt was up in chair upon OT arrival. Pt wearing O2 off of nose and up near eyes- OT instructing pt to bring nasal canula back into nose and to frequently check to make sure it is in nose. O2 sats at 88% and increased to 95% after deep breathing. Pt reports that she has a home health aide 4 hours 5 days a week that helps her with toileting and bathing as needed. Pt required min assist to complete sit to stand t/f from chair. Pt complaining of shortness of breath and fatigue throughout session. Pt would continue to benefit from OT services.      If plan is discharge home, recommend the following:   A little help with walking and/or transfers;A little help with bathing/dressing/bathroom;Assistance with cooking/housework;Assist for transportation;Help with stairs or ramp for entrance     Functional Status Assessment   Patient has had a recent decline in their functional status and demonstrates the ability to make significant improvements in function in a reasonable and predictable amount of time.     Equipment Recommendations   None recommended by OT      Precautions/Restrictions   Precautions Precautions: Fall Recall of Precautions/Restrictions:  Intact Restrictions Weight Bearing Restrictions Per Provider Order: No     Mobility Bed Mobility Overal bed mobility:  (pt received in chair)                  Transfers Overall transfer level: Needs assistance Equipment used: 1 person hand held assist Transfers: Sit to/from Stand, Bed to chair/wheelchair/BSC Sit to Stand: Min assist     Step pivot transfers: Min assist     General transfer comment: OT providing min assist with HHA      Balance Overall balance assessment: Needs assistance Sitting-balance support: Feet unsupported, No upper extremity supported Sitting balance-Leahy Scale: Fair Sitting balance - Comments: fair seated edge of chair   Standing balance support: Bilateral upper extremity supported Standing balance-Leahy Scale: Poor Standing balance comment: HHA- fair/poor                           ADL either performed or assessed with clinical judgement   ADL Overall ADL's : Needs assistance/impaired Eating/Feeding: Set up;Sitting   Grooming: Set up;Sitting   Upper Body Bathing: Minimal assistance;Sitting   Lower Body Bathing: Moderate assistance;Sitting/lateral leans   Upper Body Dressing : Minimal assistance;Sitting   Lower Body Dressing: Moderate assistance;Sitting/lateral leans;Maximal assistance   Toilet Transfer: Minimal assistance;Rolling walker (2 wheels) Toilet Transfer Details (indicate cue type and reason): simulate with chair t/f- min asisst Toileting- Clothing Manipulation and Hygiene: Minimal assistance;Sitting/lateral lean   Tub/ Shower Transfer: Minimal assistance;Rolling walker (2 wheels)   Functional mobility during ADLs: Minimal assistance       Vision Baseline Vision/History: 1 Wears glasses  Ability to See in Adequate Light: 0 Adequate Patient Visual Report: No change from baseline Vision Assessment?: No apparent visual deficits     Perception Perception: Not tested       Praxis Praxis: Not tested        Pertinent Vitals/Pain Pain Assessment Pain Assessment: No/denies pain     Extremity/Trunk Assessment Upper Extremity Assessment Upper Extremity Assessment: Generalized weakness   Lower Extremity Assessment Lower Extremity Assessment: Defer to PT evaluation   Cervical / Trunk Assessment Cervical / Trunk Assessment: Kyphotic   Communication Communication Communication: No apparent difficulties   Cognition Arousal: Alert Behavior During Therapy: WFL for tasks assessed/performed Cognition: No apparent impairments                               Following commands: Intact       Cueing  General Comments   Cueing Techniques: Verbal cues              Home Living Family/patient expects to be discharged to:: Private residence Living Arrangements: Spouse/significant other Available Help at Discharge: Family;Available 24 hours/day Type of Home: House Home Access: Stairs to enter [employer] of Steps: 3 Entrance Stairs-Rails: Right;Left Home Layout: Two level;Able to live on main level with bedroom/bathroom Alternate Level Stairs-Number of Steps: Flight   Bathroom Shower/Tub: Producer, Television/film/video: Handicapped height Bathroom Accessibility: Yes How Accessible: Accessible via walker Home Equipment: Shower seat - built in;Grab bars - tub/shower;Rolling Environmental Consultant (2 wheels);Rollator (4 wheels);Wheelchair - manual;Other (comment)          Prior Functioning/Environment Prior Level of Function : Needs assist;History of Falls (last six months)       Physical Assist : ADLs (physical);Mobility (physical) Mobility (physical): Bed mobility;Transfers;Gait;Stairs ADLs (physical): Bathing;Dressing;Toileting;IADLs Mobility Comments: household ambulation using RW ADLs Comments: assist for bathing, dressing, and PRN assist for toileting, has home aides 4 hours/day x 5 days/week    OT Problem List: Decreased strength;Decreased activity  tolerance;Impaired balance (sitting and/or standing)   OT Treatment/Interventions: Self-care/ADL training;Therapeutic exercise;DME and/or AE instruction;Energy conservation;Therapeutic activities;Patient/family education;Balance training      OT Goals(Current goals can be found in the care plan section)   Acute Rehab OT Goals Patient Stated Goal: to go home OT Goal Formulation: With patient Time For Goal Achievement: 10/23/24 Potential to Achieve Goals: Good ADL Goals Pt Will Perform Grooming: Independently;standing Pt Will Perform Upper Body Bathing: Independently;sitting Pt Will Perform Lower Body Dressing: Independently;sitting/lateral leans Pt Will Transfer to Toilet: with modified independence Pt/caregiver will Perform Home Exercise Program: Increased strength;Both right and left upper extremity;With written HEP provided   OT Frequency:  Min 2X/week    Co-evaluation              AM-PAC OT 6 Clicks Daily Activity     Outcome Measure Help from another person eating meals?: None Help from another person taking care of personal grooming?: None Help from another person toileting, which includes using toliet, bedpan, or urinal?: A Little Help from another person bathing (including washing, rinsing, drying)?: A Little Help from another person to put on and taking off regular upper body clothing?: A Little Help from another person to put on and taking off regular lower body clothing?: A Little 6 Click Score: 20   End of Session    Activity Tolerance: Patient tolerated treatment well Patient left: in chair;with call bell/phone within reach;with family/visitor present  OT Visit Diagnosis: Unsteadiness on feet (  R26.81);Repeated falls (R29.6);Muscle weakness (generalized) (M62.81)                Time: 1350-1411 OT Time Calculation (min): 21 min Charges:  OT General Charges $OT Visit: 1 Visit OT Evaluation $OT Eval Low Complexity: 1 Low    Chiquita LOISE Sermon,  OTR/L 10/09/2024, 4:03 PM

## 2024-10-09 NOTE — Progress Notes (Signed)
 "          PROGRESS NOTE  [name] FMW:[id number] DOB: 08/15/1946 DOA: 10/08/2024 PCP: Sheryle Carwin, MD  Brief History:  79 y.o. female with medical history significant for paroxysmal A-fib on Eliquis , chronic HFpEF, prior CVA, carotid artery stenosis, valvular heart disease, CKD 3B, hypertension, hyperlipidemia, type 2 diabetes, hypothyroidism, COPD, bioprosthetic aortic and mitral valve 07/18/2013 presenting with shortness of breath that began on the evening of 10/09/2023.  She has noted some lower extremity edema.  She denies any chest pain, fever, chills, nausea, vomiting, diarrhea.  She has a nonproductive cough. In the ED, the patient was afebrile and hemodynamically stable with oxygen  saturation 87% room air.  She was placed on 2 L with saturation up to 96%. WBC 5.7, hemoglobin 10.7, platelets 143.  Sodium 133, potassium 3.8, bicarbonate 30, serum creatinine 1.55.  Chest x-ray showed hazy right lower lobe opacity.  There was mild vascular congestion.  The patient was started on IV furosemide ..  Notably, the patient had a hospital admission from 09/11/2024 to 09/17/2024 during which time she was treated for COPD exacerbation and acute on chronic HFpEF.  She was discharged home with torsemide  20 mg daily.   Assessment/Plan: Acute respiratory failure with hypoxia - Multifactorial including COPD exacerbation and CHF - Stable on 2 L nasal cannula - Wean oxygen  to room air for saturation greater 92% - Obtain CT chest  Acute on chronic HFpEF - 07/22/2024 echo EF 60 to 65%, no WMA, normal RVF, status post bioprosthetic AVR and MVR - Continue IV furosemide  - Daily weights - Accurate I's and O's - Repeat echo  CKD stage IIIb - Baseline creatinine 1.1-1.4 - Monitor with diuresis  Paroxysmal atrial fibrillation - Continue apixaban   Elevated troponin - Troponin trend is flat, not consistent with ACS - Troponin 42>>41  Paroxysmal atrial fibrillation -Continue amiodarone  - Continue  apixaban   Mixed hyperlipidemia - Continue statin  Diabetes mellitus type 2 - NovoLog  sliding scale - 09/12/2024 hemoglobin A1c 6.3 - Anticipate elevated CBGs secondary to steroids       Family Communication:   spouse at bedside  Consultants:  none  Code Status:  DNR  DVT Prophylaxis:  [place] Heparin  / [place] Lovenox    Procedures: As Listed in Progress Note Above  Antibiotics: None        Subjective: She continues to complain shortness of breath.  She denies any chest pain, nausea, vomiting or direct abdominal pain.  Objective: Vitals:   10/09/24 0013 10/09/24 0518 10/09/24 0808 10/09/24 0900  BP: 138/67 (!) 112/53  107/62  Pulse: 78 76  80  Resp: 20 20    Temp: 97.7 F (36.5 C) 98.2 F (36.8 C)  97.9 F (36.6 C)  TempSrc: Oral Oral  Oral  SpO2: 97% 96% 93% 95%  Weight:  66.3 kg    Height:        Intake/Output Summary (Last 24 hours) at 10/09/2024 1007 Last data filed at 10/09/2024 0900 Gross per 24 hour  Intake 410 ml  Output 850 ml  Net -440 ml   Weight change:  Exam:  General:  Pt is alert, follows commands appropriately, not in acute distress HEENT: No icterus, No thrush, No neck mass, [place]/AT Cardiovascular: RRR, S1/S2, no rubs, no gallops Respiratory: Scattered bibasilar rales.  Bibasilar wheeze.  Good air movement Abdomen: Soft/+BS, non tender, non distended, no guarding Extremities: 1 + LE edema, No lymphangitis, No petechiae, No rashes, no synovitis   Data Reviewed: I have personally reviewed  following labs and imaging studies Basic Metabolic Panel: Recent Labs  Lab 10/08/24 2147 10/09/24 0021 10/09/24 0427  NA 133*  --  136  K 3.8  --  3.1*  CL 91*  --  94*  CO2 30  --  33*  GLUCOSE 122*  --  97  BUN 16  --  15  CREATININE 1.55*  --  1.48*  CALCIUM  8.4*  --  8.0*  MG  --  1.3*  --   PHOS  --  2.5  --    Liver Function Tests: No results for input(s): AST, ALT, ALKPHOS, BILITOT, PROT, ALBUMIN  in the last 168 hours. No  results for input(s): LIPASE, AMYLASE in the last 168 hours. No results for input(s): AMMONIA in the last 168 hours. Coagulation Profile: No results for input(s): INR, PROTIME in the last 168 hours. CBC: Recent Labs  Lab 10/08/24 2147 10/09/24 0427  WBC 5.7 5.4  HGB 10.7* 10.6*  HCT 33.9* 34.2*  MCV 85.6 84.9  PLT 143* 138*   Cardiac Enzymes: No results for input(s): CKTOTAL, CKMB, CKMBINDEX, TROPONINI in the last 168 hours. BNP: Invalid input(s): POCBNP CBG: No results for input(s): GLUCAP in the last 168 hours. HbA1C: No results for input(s): HGBA1C in the last 72 hours. Urine analysis:    Component Value Date/Time   COLORURINE YELLOW 10/09/2024 0406   APPEARANCEUR CLEAR 10/09/2024 0406   LABSPEC 1.006 10/09/2024 0406   PHURINE 7.0 10/09/2024 0406   GLUCOSEU NEGATIVE 10/09/2024 0406   HGBUR NEGATIVE 10/09/2024 0406   BILIRUBINUR NEGATIVE 10/09/2024 0406   KETONESUR NEGATIVE 10/09/2024 0406   PROTEINUR NEGATIVE 10/09/2024 0406   UROBILINOGEN 0.2 07/07/2014 1935   NITRITE NEGATIVE 10/09/2024 0406   LEUKOCYTESUR NEGATIVE 10/09/2024 0406   Sepsis Labs: @LABRCNTIP (procalcitonin:4,lacticidven:4) )No results found for this or any previous visit (from the past 240 hours).   Scheduled Meds:  apixaban   5 mg Oral BID   arformoterol   15 mcg Nebulization BID   atorvastatin   40 mg Oral Daily   budesonide  (PULMICORT ) nebulizer solution  0.5 mg Nebulization BID   feeding supplement  237 mL Oral BID BM   levothyroxine   100 mcg Oral QAC breakfast   methylPREDNISolone  (SOLU-MEDROL ) injection  40 mg Intravenous Q12H   pantoprazole   40 mg Oral QAC breakfast   potassium chloride   20 mEq Oral TID   Continuous Infusions:  Procedures/Studies: DG Chest Portable 1 View Result Date: 10/08/2024 EXAM: 1 VIEW(S) XRAY OF THE CHEST 10/08/2024 09:39:00 PM COMPARISON: 09/11/2024 CLINICAL HISTORY: shortness of breath FINDINGS: LINES, TUBES AND DEVICES: Loop recorder  device noted. LUNGS AND PLEURA: There is mild central pulmonary vascular congestion/interstitial edema. There is some increasing hazy opacities in the right lower lobe, which may represent atelectasis or airspace disease. Stable right pleural effusion. No pneumothorax. HEART AND MEDIASTINUM: Stable cardiomegaly. Coronary artery stent in place. Cardiac valve prostheses in place. BONES AND SOFT TISSUES: Right chest wall surgical clips noted. Nondisplaced median sternotomy wires. No acute osseous abnormality. IMPRESSION: 1. Increasing hazy right lower lobe opacities, which may represent atelectasis or airspace disease. 2. Mild central pulmonary vascular congestion with interstitial edema. 3. Stable right pleural effusion. Electronically signed by: Greig Pique MD 10/08/2024 09:45 PM EST RP Workstation: [id number]   CUP PACEART REMOTE DEVICE CHECK Result Date: 09/16/2024 ILR summary report received. Battery status OK. Normal device function. No new symptom, tachy, brady, or pause episodes. No new AF episodes. Monthly summary reports and ROV/PRN LA, CVRS  DG Chest Portable 1 View Result  Date: 09/11/2024 EXAM: 1 VIEW(S) XRAY OF THE CHEST 09/11/2024 07:45:00 PM COMPARISON: 07/21/2024 CLINICAL HISTORY: hypoxia FINDINGS: LINES, TUBES AND DEVICES: Surgical clips in right chest wall/axilla. Implantable loop recorder in left chest. LUNGS AND PLEURA: Increased interstitial markings, worsened from prior. Nonspecific chronic blunting of the left costophrenic angle. Question of developing right mid lung zone airspace opacity. No pleural effusion. No pneumothorax. HEART AND MEDIASTINUM: Aortic valve replacements. Cardiomegaly with median sternotomy wires. BONES AND SOFT TISSUES: No acute osseous abnormality. IMPRESSION: 1. Question of developing right mid lung zone airspace opacity. 2. Increased interstitial markings, worsened from prior. Electronically signed by: Morgane Naveau MD 09/11/2024 07:58 PM EST RP Workstation:  [id number]   CT ABDOMEN PELVIS W CONTRAST Result Date: 09/11/2024 EXAM: CT ABDOMEN AND PELVIS WITH CONTRAST 09/11/2024 06:03:00 PM TECHNIQUE: CT of the abdomen and pelvis was performed with the administration of 80 mL of iohexol  (OMNIPAQUE ) 300 MG/ML solution. Multiplanar reformatted images are provided for review. Automated exposure control, iterative reconstruction, and/or weight-based adjustment of the mA/kV was utilized to reduce the radiation dose to as low as reasonably achievable. COMPARISON: None available. CLINICAL HISTORY: LLQ abdominal pain. FINDINGS: LIMITATIONS/ARTIFACTS: Limited exam due to persistent motion artifact. LOWER CHEST: No acute abnormality. LIVER: The liver is unremarkable. GALLBLADDER AND BILE DUCTS: Gallbladder is unremarkable. No biliary ductal dilatation. SPLEEN: No acute abnormality. PANCREAS: No acute abnormality. ADRENAL GLANDS: No acute abnormality. KIDNEYS, URETERS AND BLADDER: Right kidney in particular is poorly visualized. No stones in the kidneys or ureters. No hydronephrosis. No perinephric or periureteral stranding. Urinary bladder is unremarkable. GI AND BOWEL: Stomach demonstrates no acute abnormality. Sigmoid diverticulosis without inflammation. There is no bowel obstruction. PERITONEUM AND RETROPERITONEUM: No ascites. No free air. VASCULATURE: Aortic atherosclerosis. Aorta is normal in caliber. LYMPH NODES: No lymphadenopathy. REPRODUCTIVE ORGANS: No acute abnormality. BONES AND SOFT TISSUES: No acute osseous abnormality. No focal soft tissue abnormality. IMPRESSION: 1. Limited exam due to persistent motion artifact, with the right kidney in particular poorly visualized. 2. Sigmoid diverticulosis without inflammation. 3. Aortic atherosclerosis. Electronically signed by: Lynwood Seip MD 09/11/2024 06:16 PM EST RP Workstation: [id number]    Alm Schneider, DO  [facility]  If 7PM-7AM, please contact night-coverage [email or web address] Password [email or web address] 10/09/2024, 10:07  AM   LOS: 1 day   "

## 2024-10-09 NOTE — Evaluation (Signed)
 Physical Therapy Evaluation Patient Details Name: [name] MRN: [id number] DOB: 29-Oct-1945 Today's Date: 10/09/2024  History of Present Illness  [name] is a 79 y.o. female with medical history significant for paroxysmal A-fib on Eliquis , chronic HFpEF, prior CVA, carotid artery stenosis, valvular heart disease, CKD 3B, hypertension, hyperlipidemia, type 2 diabetes, hypothyroidism, COPD, presents to the ER with complaints of sudden onset shortness of breath, around 8 PM tonight.  Associated with bilateral lower extremity edema.  No reported anginal symptoms.  The patient was brought into the ER for further evaluation.   Clinical Impression  Patient demonstrates slow labored movement for sitting up, transfers and ambulating in room using RW, easily fatigued and SpO2 dropped from 93% to 85% while on room. Patient able to transfer to/from commode in bathroom and tolerated sitting up in chair on 2 lpm O2 with SpO2 at 94%, her spouse present. Patient will benefit from continued skilled physical therapy in hospital and recommended venue below to increase strength, balance, endurance for safe ADLs and gait.         If plan is discharge home, recommend the following: A little help with walking and/or transfers;A little help with bathing/dressing/bathroom;Help with stairs or ramp for entrance;Assist for transportation;Assistance with cooking/housework   Can travel by private vehicle        Equipment Recommendations None recommended by PT  Recommendations for Other Services       Functional Status Assessment Patient has had a recent decline in their functional status and demonstrates the ability to make significant improvements in function in a reasonable and predictable amount of time.     Precautions / Restrictions Precautions Precautions: Fall Recall of Precautions/Restrictions: Intact Restrictions Weight Bearing Restrictions Per Provider Order: No      Mobility  Bed  Mobility Overal bed mobility: Needs Assistance Bed Mobility: Supine to Sit     Supine to sit: Supervision, Contact guard, HOB elevated     General bed mobility comments: required HOB partially raised, slow labored movement    Transfers Overall transfer level: Needs assistance Equipment used: Rolling walker (2 wheels) Transfers: Sit to/from Stand, Bed to chair/wheelchair/BSC Sit to Stand: Contact guard assist, Min assist   Step pivot transfers: Contact guard assist, Min assist       General transfer comment: fair/good return for transferring to/from commode in bathroom with labored movement    Ambulation/Gait Ambulation/Gait assistance: Contact guard assist, Min assist Gait Distance (Feet): 15 Feet Assistive device: Rolling walker (2 wheels) Gait Pattern/deviations: Decreased step length - right, Decreased step length - left, Decreased stride length Gait velocity: decreased     General Gait Details: slow labored unsteady movement with increased time for making turns and limited mostly due to fatigue  Stairs            Wheelchair Mobility     Tilt Bed    Modified Rankin (Stroke Patients Only)       Balance Overall balance assessment: Needs assistance Sitting-balance support: Feet unsupported, No upper extremity supported Sitting balance-Leahy Scale: Fair Sitting balance - Comments: fair/good seated at EOB   Standing balance support: Reliant on assistive device for balance, During functional activity, Bilateral upper extremity supported Standing balance-Leahy Scale: Poor Standing balance comment: fair/poor using RW                             Pertinent Vitals/Pain Pain Assessment Pain Assessment: No/denies pain    Home Living Family/patient  expects to be discharged to:: Private residence Living Arrangements: Spouse/significant other Available Help at Discharge: Family;Available 24 hours/day Type of Home: House Home Access: Stairs to  enter Entrance Stairs-Rails: Right;Left Entrance Stairs-Number of Steps: 3 Alternate Level Stairs-Number of Steps: Flight   Home Equipment: Shower seat - built in;Grab bars - tub/shower;Rolling Environmental Consultant (2 wheels);Rollator (4 wheels);Wheelchair - Careers Adviser (comment)      Prior Function Prior Level of Function : Needs assist;History of Falls (last six months)       Physical Assist : ADLs (physical);Mobility (physical) Mobility (physical): Bed mobility;Transfers;Gait;Stairs ADLs (physical): Bathing;Dressing;Toileting;IADLs Mobility Comments: household ambulation using RW ADLs Comments: assist for bathing, dressing, and PRN assist for toileting, has home aides 4 hours/day x 5 days/week     Extremity/Trunk Assessment   Upper Extremity Assessment Upper Extremity Assessment: Generalized weakness    Lower Extremity Assessment Lower Extremity Assessment: Generalized weakness    Cervical / Trunk Assessment Cervical / Trunk Assessment: Kyphotic  Communication   Communication Communication: No apparent difficulties    Cognition Arousal: Alert Behavior During Therapy: WFL for tasks assessed/performed   PT - Cognitive impairments: No apparent impairments                         Following commands: Intact       Cueing Cueing Techniques: Verbal cues     General Comments      Exercises     Assessment/Plan    PT Assessment Patient needs continued PT services  PT Problem List Decreased strength;Decreased activity tolerance;Decreased balance;Decreased mobility       PT Treatment Interventions DME instruction;Stair training;Functional mobility training;Balance training;Therapeutic exercise;Gait training;Therapeutic activities;Patient/family education    PT Goals (Current goals can be found in the Care Plan section)  Acute Rehab PT Goals Patient Stated Goal: return home with family and home aides to assist PT Goal Formulation: With patient/family Time For Goal  Achievement: 10/16/24 Potential to Achieve Goals: Good    Frequency Min 3X/week     Co-evaluation               AM-PAC PT 6 Clicks Mobility  Outcome Measure Help needed turning from your back to your side while in a flat bed without using bedrails?: A Little Help needed moving from lying on your back to sitting on the side of a flat bed without using bedrails?: A Little Help needed moving to and from a bed to a chair (including a wheelchair)?: A Little Help needed standing up from a chair using your arms (e.g., wheelchair or bedside chair)?: A Little Help needed to walk in hospital room?: A Lot Help needed climbing 3-5 steps with a railing? : A Lot 6 Click Score: 16    End of Session Equipment Utilized During Treatment: Oxygen  Activity Tolerance: Patient tolerated treatment well;Patient limited by fatigue Patient left: in chair;with call bell/phone within reach;with family/visitor present Nurse Communication: Mobility status PT Visit Diagnosis: Unsteadiness on feet (R26.81);Other abnormalities of gait and mobility (R26.89);Muscle weakness (generalized) (M62.81)    Time: 8966-8896 PT Time Calculation (min) (ACUTE ONLY): 30 min   Charges:   PT Evaluation $PT Eval Moderate Complexity: 1 Mod PT Treatments $Therapeutic Activity: 23-37 mins PT General Charges $$ ACUTE PT VISIT: 1 Visit         3:17 PM, 10/09/2024 Lynwood Music, MPT Physical Therapist with [facility] [phone] office [phone] mobile phone

## 2024-10-09 NOTE — Telephone Encounter (Signed)
 Pharmacy Patient Advocate Encounter  Insurance verification completed.    The patient is insured through [employer]. Patient has Medicare and is not eligible for a copay card, but may be able to apply for patient assistance or Medicare RX Payment Plan (Patient Must reach out to their plan, if eligible for payment plan), if available.    Ran test claim for Eliquis  5mg  tablet and the current 30 day co-pay is $40.   This test claim was processed through [facility]- copay amounts may vary at other pharmacies due to [employer], or as the patient moves through the different stages of their insurance plan.

## 2024-10-09 NOTE — Plan of Care (Signed)
" °  Problem: Acute Rehab PT Goals(only PT should resolve) Goal: Pt Will Go Supine/Side To Sit Outcome: Progressing Flowsheets (Taken 10/09/2024 1519) Pt will go Supine/Side to Sit:  with modified independence  with supervision Goal: Patient Will Transfer Sit To/From Stand Outcome: Progressing Flowsheets (Taken 10/09/2024 1519) Patient will transfer sit to/from stand: with supervision Goal: Pt Will Transfer Bed To Chair/Chair To Bed Outcome: Progressing Flowsheets (Taken 10/09/2024 1519) Pt will Transfer Bed to Chair/Chair to Bed:  with supervision  with contact guard assist Goal: Pt Will Ambulate Outcome: Progressing Flowsheets (Taken 10/09/2024 1519) Pt will Ambulate:  25 feet  with contact guard assist  with supervision  with rolling walker   3:20 PM, 10/09/2024 Lynwood Music, MPT Physical Therapist with [facility] [phone] office [phone] mobile phone  "

## 2024-10-09 NOTE — Hospital Course (Addendum)
 Mrs. Courser was admitted to the hospital with the working diagnosis of heart failure exacerbation,   79 y.o. female with medical history significant for paroxysmal A-fib, heart failure, prior CVA, carotid artery stenosis, valvular heart disease, CKD 3B, hypertension, hyperlipidemia, type 2 diabetes, hypothyroidism, COPD who presented with dyspnea and lower extremity edema. On the day of admission she had severe symptoms, prompting EMS call. She was found with increased work of breathing, but 02 saturation 96% on room air, she was placed on supplemental 02 per [place] 2 L/min and was transported to the ED.  On her initial physical examination her blood pressure was 127/60, HR 72, RR 21 and 02 saturation 95% on supplemental 02 per [place]  Lungs with diffuse rales bilaterally, heart with S1 and S2 present and regular, positive systolic murmur at the base, positive JVD, abdomen with no distention and positive lower extremity edema ++.   Na 133, K 3.8 Cl 91 bicarbonate 30, glucose 122, bun 16 cr 1,55 Mg 1.3  Pro BNP 5,574 High sensitive troponin 42 and 41  Pro calcitonin < 0.10  Wbc 5.7 hgb 10.7 plt 143  D dimer 2.26  Sars COVID 19 negative Influenza negative RSV negative   Urine analysis SG 1,004, negative protein, large leukocytes, small hgb, negative glucose, 21-50 wbc, 0-5 rbc  Urine Na 38   Chest radiograph with cardiomegaly, bilateral hilar vascular congestion and bilateral central interstitial infiltrates, sternotomy wires and valve hardware in place (mitral and aortic)  CT chest with bilateral ground glass opacities, right pleural effusion with fluid in the fissure. Left pleural effusion.   EKG 71 bpm, right axis deviation, qtc 594, sinus rhythm with first degree AV block, with no significant ST segment or T wave changes.   Patient was placed on IV furosemide  for diuresis  Echocardiogram with mild reduction in  LV systolic function, positive signs of RV failure.  01/17 transferred from [facility] to [facility]  for further valvular work up.   01/22 cardiac catheterization with severe mitral and aortic stenosis,elevated filling pressures, with low cardiac index, low output state.  Started on trial of IV milrinone  per mid line   01/23 patient continue clinical deterioration while on milrinone , decision was made to transition to palliative care.  Discontinue milrinone   01/24 continue care at home  under hospice services.

## 2024-10-09 NOTE — TOC Initial Note (Signed)
 Transition of Care [facility]) - Initial/Assessment Note    Patient Details  Name: [name] MRN: [id number] Date of Birth: 04-29-1946  Transition of Care [facility]) CM/SW Contact:    Mcarthur Saddie Kim, LCSW Phone Number: 10/09/2024, 11:40 AM  Clinical Narrative: Pt admitted for acute on chronic CHF. Assessment completed due to high risk readmission score. Pt lives with her husband who assists with ADLs. She ambulates with walker at baseline. Pt is active with [facility]. Cory with [facility] confirms and is aware of admission. Will need HHPT order. TOC will monitor for home O2 needs.                   Expected Discharge Plan: Home w Home Health Services Barriers to Discharge: Continued Medical Work up   Patient Goals and CMS Choice Patient states their goals for this hospitalization and ongoing recovery are:: return home   Choice offered to / list presented to : Spouse Cleona ownership interest in [facility].provided to::  (n/a)    Expected Discharge Plan and Services In-house Referral: Clinical Social Work   Post Acute Care Choice: Resumption of Svcs/PTA Provider Living arrangements for the past 2 months: Single Family Home                           HH Arranged: PT HH Agency: [facility] Date [facility] Agency Contacted: 10/09/24 Time HH Agency Contacted: [id number] Representative spoke with at [facility] Agency: Darleene  Prior Living Arrangements/Services Living arrangements for the past 2 months: Single Family Home Lives with:: Spouse Patient language and need for interpreter reviewed:: Yes Do you feel safe going back to the place where you live?: Yes      Need for Family Participation in Patient Care: Yes (Comment) Care giver support system in place?: Yes (comment) Current home services: DME (Rolling walker) Criminal Activity/Legal Involvement Pertinent to Current Situation/Hospitalization: No - Comment as needed  Activities of Daily Living   ADL Screening  (condition at time of admission) Independently performs ADLs?: Yes (appropriate for developmental age) Is the patient deaf or have difficulty hearing?: No Does the patient have difficulty seeing, even when wearing glasses/contacts?: No Does the patient have difficulty concentrating, remembering, or making decisions?: No  Permission Sought/Granted                  Emotional Assessment       Orientation: : Oriented to Self, Oriented to Place Alcohol / Substance Use: Not Applicable Psych Involvement: No (comment)  Admission diagnosis:  Acute on chronic diastolic (congestive) heart failure (HCC) [I50.33] Acute on chronic congestive heart failure, unspecified heart failure type [facility]) [I50.9] Patient Active Problem List   Diagnosis Date Noted   Acute on chronic diastolic (congestive) heart failure (HCC) 10/08/2024   Elevated troponin 09/15/2024   Generalized weakness 07/22/2024   Elevated d-dimer 07/22/2024   Chronic kidney disease, stage 3b (HCC) 07/22/2024   Acute on chronic heart failure with preserved ejection fraction (HFpEF, >= 50%) (HCC) 07/22/2024   Mood disorder 07/22/2024   Fracture of orbital floor, closed (HCC) 06/29/2024   Injury of face 06/29/2024   Lip laceration 06/29/2024   Chin laceration 06/29/2024   Intraparenchymal hematoma of brain (HCC) 06/28/2024   Heart failure with recovered ejection fraction (HFrecEF) (HCC) 06/28/2024   Atypical atrial flutter (HCC) 01/04/2023   Syncope 11/11/2022   Acute respiratory failure with hypoxia (HCC) 03/31/2022   Acute congestive heart failure (HCC)  03/31/2022   CAP (community acquired pneumonia) 03/31/2022   RSV infection 06/03/2021   COPD with acute exacerbation (HCC) 06/01/2021   Type 2 diabetes mellitus treated without insulin  (HCC) 06/01/2021   COPD exacerbation (HCC) 06/01/2021   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   Asthma exacerbation 11/12/2016   Synovial cyst of lumbar spine 05/13/2014   Encounter for  therapeutic drug monitoring 11/06/2013   Hyponatremia    Atrial fibrillation (HCC)    Moderate aortic stenosis 07/12/2013   Moderate aortic insufficiency 07/12/2013   S/P MVR (mitral valve replacement) 07/12/2013   Severe mitral valve stenosis 07/09/2013   Mitral stenosis 07/08/2013   Chest pain 07/07/2013   Asthma, cough variant 03/27/2013   Upper airway cough syndrome in pt with mild cough variant asthma  09/17/2012   Dyspnea on exertion 06/13/2012   Edema 06/13/2012   Rheumatic heart disease 06/13/2012   Fasting hyperglycemia    Chronic anticoagulation 01/26/2012   Acquired hypothyroidism 01/23/2012   Cardioembolic stroke (HCC) 01/02/2012   Mixed hyperlipidemia    Essential hypertension    Breast carcinoma (HCC)    Gastroesophageal reflux disease    PCP:  Sheryle Carwin, MD Pharmacy:   [facility] - [place], [place] - [address] [place] [address] Phone: [phone] Fax: [phone]     Social Drivers of Health (SDOH) Social History: SDOH Screenings   Food Insecurity: No Food Insecurity (10/09/2024)  Housing: Low Risk (10/09/2024)  Transportation Needs: No Transportation Needs (10/09/2024)  Utilities: Not At Risk (10/09/2024)  Social Connections: Moderately Isolated (10/09/2024)  Tobacco Use: Low Risk (10/08/2024)   SDOH Interventions:     Readmission Risk Interventions    10/09/2024   11:16 AM 09/17/2024   11:14 AM 09/16/2024   11:53 AM  Readmission Risk Prevention Plan  Transportation Screening Complete Complete Complete  Medication Review Oceanographer) Complete Complete Complete  PCP or Specialist appointment within 3-5 days of discharge  Complete   HRI or Home Care Consult Complete Complete Complete  SW Recovery Care/Counseling Consult Complete Complete Complete  Palliative Care Screening Not Applicable Not Applicable Not Applicable  Skilled Nursing Facility Not Applicable Not Applicable Not Applicable

## 2024-10-10 ENCOUNTER — Inpatient Hospital Stay (HOSPITAL_COMMUNITY)

## 2024-10-10 DIAGNOSIS — J441 Chronic obstructive pulmonary disease with (acute) exacerbation: Secondary | ICD-10-CM | POA: Diagnosis not present

## 2024-10-10 DIAGNOSIS — I480 Paroxysmal atrial fibrillation: Secondary | ICD-10-CM | POA: Diagnosis not present

## 2024-10-10 DIAGNOSIS — J9601 Acute respiratory failure with hypoxia: Secondary | ICD-10-CM | POA: Diagnosis not present

## 2024-10-10 DIAGNOSIS — I5033 Acute on chronic diastolic (congestive) heart failure: Secondary | ICD-10-CM | POA: Diagnosis not present

## 2024-10-10 LAB — GLUCOSE, CAPILLARY
Glucose-Capillary: 232 mg/dL — ABNORMAL HIGH (ref 70–99)
Glucose-Capillary: 271 mg/dL — ABNORMAL HIGH (ref 70–99)

## 2024-10-10 LAB — TSH: TSH: 2.04 u[IU]/mL (ref 0.350–4.500)

## 2024-10-10 LAB — BASIC METABOLIC PANEL WITH GFR
Anion gap: 17 — ABNORMAL HIGH (ref 5–15)
BUN: 18 mg/dL (ref 8–23)
CO2: 22 mmol/L (ref 22–32)
Calcium: 8.4 mg/dL — ABNORMAL LOW (ref 8.9–10.3)
Chloride: 93 mmol/L — ABNORMAL LOW (ref 98–111)
Creatinine, Ser: 1.53 mg/dL — ABNORMAL HIGH (ref 0.44–1.00)
GFR, Estimated: 34 mL/min — ABNORMAL LOW
Glucose, Bld: 207 mg/dL — ABNORMAL HIGH (ref 70–99)
Potassium: 4.5 mmol/L (ref 3.5–5.1)
Sodium: 131 mmol/L — ABNORMAL LOW (ref 135–145)

## 2024-10-10 LAB — MAGNESIUM: Magnesium: 1.9 mg/dL (ref 1.7–2.4)

## 2024-10-10 LAB — T4, FREE: Free T4: 1.9 ng/dL (ref 0.80–2.00)

## 2024-10-10 LAB — VITAMIN B12: Vitamin B-12: 447 pg/mL (ref 180–914)

## 2024-10-10 LAB — FOLATE: Folate: 16.6 ng/mL

## 2024-10-10 MED ORDER — INSULIN ASPART 100 UNIT/ML IJ SOLN
0.0000 [IU] | Freq: Every day | INTRAMUSCULAR | Status: DC
  Administered 2024-10-10 – 2024-10-11 (×2): 3 [IU] via SUBCUTANEOUS
  Administered 2024-10-14 – 2024-10-22 (×5): 2 [IU] via SUBCUTANEOUS
  Filled 2024-10-10: qty 2
  Filled 2024-10-10: qty 1
  Filled 2024-10-10: qty 2
  Filled 2024-10-10 (×2): qty 1
  Filled 2024-10-10: qty 2
  Filled 2024-10-10: qty 1

## 2024-10-10 MED ORDER — INSULIN ASPART 100 UNIT/ML IJ SOLN
0.0000 [IU] | Freq: Three times a day (TID) | INTRAMUSCULAR | Status: DC
  Administered 2024-10-10: 3 [IU] via SUBCUTANEOUS
  Administered 2024-10-11: 7 [IU] via SUBCUTANEOUS
  Administered 2024-10-11: 5 [IU] via SUBCUTANEOUS
  Administered 2024-10-11: 3 [IU] via SUBCUTANEOUS
  Administered 2024-10-12: 1 [IU] via SUBCUTANEOUS
  Administered 2024-10-12: 3 [IU] via SUBCUTANEOUS
  Administered 2024-10-12: 2 [IU] via SUBCUTANEOUS
  Administered 2024-10-13 (×2): 1 [IU] via SUBCUTANEOUS
  Administered 2024-10-13 – 2024-10-14 (×2): 3 [IU] via SUBCUTANEOUS
  Administered 2024-10-14: 2 [IU] via SUBCUTANEOUS
  Administered 2024-10-14: 1 [IU] via SUBCUTANEOUS
  Administered 2024-10-15: 3 [IU] via SUBCUTANEOUS
  Administered 2024-10-16: 1 [IU] via SUBCUTANEOUS
  Administered 2024-10-16: 5 [IU] via SUBCUTANEOUS
  Administered 2024-10-16: 2 [IU] via SUBCUTANEOUS
  Administered 2024-10-17: 1 [IU] via SUBCUTANEOUS
  Administered 2024-10-17: 3 [IU] via SUBCUTANEOUS
  Administered 2024-10-17: 1 [IU] via SUBCUTANEOUS
  Administered 2024-10-18: 3 [IU] via SUBCUTANEOUS
  Administered 2024-10-18: 2 [IU] via SUBCUTANEOUS
  Administered 2024-10-19 – 2024-10-20 (×3): 3 [IU] via SUBCUTANEOUS
  Administered 2024-10-20 (×2): 1 [IU] via SUBCUTANEOUS
  Administered 2024-10-21: 2 [IU] via SUBCUTANEOUS
  Administered 2024-10-21 – 2024-10-22 (×3): 1 [IU] via SUBCUTANEOUS
  Administered 2024-10-22: 3 [IU] via SUBCUTANEOUS
  Administered 2024-10-23: 1 [IU] via SUBCUTANEOUS
  Administered 2024-10-23 – 2024-10-25 (×4): 2 [IU] via SUBCUTANEOUS
  Administered 2024-10-25 – 2024-10-26 (×2): 1 [IU] via SUBCUTANEOUS
  Administered 2024-10-26: 2 [IU] via SUBCUTANEOUS
  Filled 2024-10-10 (×7): qty 1
  Filled 2024-10-10: qty 5
  Filled 2024-10-10: qty 2
  Filled 2024-10-10 (×3): qty 1
  Filled 2024-10-10: qty 2
  Filled 2024-10-10: qty 1
  Filled 2024-10-10 (×3): qty 3
  Filled 2024-10-10 (×5): qty 1
  Filled 2024-10-10: qty 3
  Filled 2024-10-10 (×4): qty 1
  Filled 2024-10-10 (×2): qty 2
  Filled 2024-10-10 (×2): qty 1
  Filled 2024-10-10: qty 2
  Filled 2024-10-10 (×4): qty 1

## 2024-10-10 MED ORDER — AMIODARONE HCL 200 MG PO TABS
200.0000 mg | ORAL_TABLET | Freq: Every day | ORAL | Status: DC
  Administered 2024-10-10 – 2024-10-26 (×16): 200 mg via ORAL
  Filled 2024-10-10 (×16): qty 1

## 2024-10-10 MED ORDER — FUROSEMIDE 10 MG/ML IJ SOLN
40.0000 mg | Freq: Once | INTRAMUSCULAR | Status: AC
  Administered 2024-10-10: 40 mg via INTRAVENOUS
  Filled 2024-10-10: qty 4

## 2024-10-10 NOTE — Progress Notes (Signed)
 Mobility Specialist Progress Note:    10/10/24 0930  Mobility  Activity Ambulated with assistance  Level of Assistance Contact guard assist, steadying assist  Assistive Device Other (Comment) (1 HHA)  Distance Ambulated (ft) 6 ft  Range of Motion/Exercises Active;All extremities  Activity Response Tolerated well  Mobility Referral Yes  Mobility visit 1 Mobility  Mobility Specialist Start Time (ACUTE ONLY) 0930  Mobility Specialist Stop Time (ACUTE ONLY) 0946  Mobility Specialist Time Calculation (min) (ACUTE ONLY) 16 min   Pt received in bathroom, requesting assistance to chair. Required CGA to stand and ambulate with 1 hand-held assist. Tolerated well, asx throughout. Husband and NT in room. All needs met.  Reannon Candella Mobility Specialist Please contact via Special Educational Needs Teacher or  Rehab office at [phone]

## 2024-10-10 NOTE — Plan of Care (Signed)

## 2024-10-10 NOTE — Progress Notes (Signed)
 "          PROGRESS NOTE  [name] FMW:[id number] DOB: 31-Jul-1946 DOA: 10/08/2024 PCP: Sheryle Carwin, MD  Brief History:  79 y.o. female with medical history significant for paroxysmal A-fib on Eliquis , chronic HFpEF, prior CVA, carotid artery stenosis, valvular heart disease, CKD 3B, hypertension, hyperlipidemia, type 2 diabetes, hypothyroidism, COPD, bioprosthetic aortic and mitral valve 07/18/2013 presenting with shortness of breath that began on the evening of 10/09/2023.  She has noted some lower extremity edema.  She denies any chest pain, fever, chills, nausea, vomiting, diarrhea.  She has a nonproductive cough. In the ED, the patient was afebrile and hemodynamically stable with oxygen  saturation 87% room air.  She was placed on 2 L with saturation up to 96%. WBC 5.7, hemoglobin 10.7, platelets 143.  Sodium 133, potassium 3.8, bicarbonate 30, serum creatinine 1.55.  Chest x-ray showed hazy right lower lobe opacity.  There was mild vascular congestion.  The patient was started on IV furosemide ..  Notably, the patient had a hospital admission from 09/11/2024 to 09/17/2024 during which time she was treated for COPD exacerbation and acute on chronic HFpEF.  She was discharged home with torsemide  20 mg daily.   Assessment/Plan: Acute respiratory failure with hypoxia - Multifactorial including COPD exacerbation and CHF - Stable on 2 L nasal cannula - Wean oxygen  to room air for saturation greater 92% - Obtain CT chest   Acute on chronic HFpEF - 07/22/2024 echo EF 60 to 65%, no WMA, normal RVF, status post bioprosthetic AVR and MVR - Continue IV furosemide  - Daily weights--NEG 2 lb - Accurate I's and O's--incomplete   CKD stage IIIb - Baseline creatinine 1.1-1.4 - Monitor with diuresis   Paroxysmal atrial fibrillation - Continue apixaban  - continue amiodarone  - currently in sinus   Elevated troponin - Troponin trend is flat, not consistent with ACS - Troponin 42>>41   Mixed  hyperlipidemia - Continue statin   Diabetes mellitus type 2 - NovoLog  sliding scale - 09/12/2024 hemoglobin A1c 6.3 - Anticipate elevated CBGs secondary to steroids - novolog  sliding scale  Elevated D-dimer -very low clinical suspicion of PE -pt compliant with apixaban              Family Communication:   spouse at bedside 1/9   Consultants:  none   Code Status:  DNR   DVT Prophylaxis: apixaban      Procedures: As Listed in Progress Note Above   Antibiotics: None       Subjective: Patient states that her breathing is little better today.  She denies any chest pain, abdominal pain, nausea, vomiting, diarrhea, abdominal pain, hematochezia, melena.  Objective: Vitals:   10/09/24 1930 10/10/24 0312 10/10/24 0753 10/10/24 1340  BP: 91/70 137/62  (!) 141/68  Pulse: 89 75  77  Resp: 20     Temp: 98 F (36.7 C) 98.4 F (36.9 C)  97.8 F (36.6 C)  TempSrc: Oral Oral  Oral  SpO2: 91% 91% (!) 88% 97%  Weight:  68.2 kg    Height:        Intake/Output Summary (Last 24 hours) at 10/10/2024 1423 Last data filed at 10/10/2024 0859 Gross per 24 hour  Intake 480 ml  Output 350 ml  Net 130 ml   Weight change: -1 kg Exam:  General:  Pt is alert, follows commands appropriately, not in acute distress HEENT: No icterus, No thrush, No neck mass, [place]/AT Cardiovascular: RRR, S1/S2, no rubs, no gallops Respiratory: Bibasilar crackles but no wheezing.  Good air movement Abdomen: Soft/+BS, non tender, non distended, no guarding Extremities: 1 + LE edema, No lymphangitis, No petechiae, No rashes, no synovitis   Data Reviewed: I have personally reviewed following labs and imaging studies Basic Metabolic Panel: Recent Labs  Lab 10/08/24 2147 10/09/24 0021 10/09/24 0427 10/10/24 0431  NA 133*  --  136 131*  K 3.8  --  3.1* 4.5  CL 91*  --  94* 93*  CO2 30  --  33* 22  GLUCOSE 122*  --  97 207*  BUN 16  --  15 18  CREATININE 1.55*  --  1.48* 1.53*  CALCIUM  8.4*  --   8.0* 8.4*  MG  --  1.3*  --  1.9  PHOS  --  2.5  --   --    Liver Function Tests: No results for input(s): AST, ALT, ALKPHOS, BILITOT, PROT, ALBUMIN  in the last 168 hours. No results for input(s): LIPASE, AMYLASE in the last 168 hours. No results for input(s): AMMONIA in the last 168 hours. Coagulation Profile: No results for input(s): INR, PROTIME in the last 168 hours. CBC: Recent Labs  Lab 10/08/24 2147 10/09/24 0427  WBC 5.7 5.4  HGB 10.7* 10.6*  HCT 33.9* 34.2*  MCV 85.6 84.9  PLT 143* 138*   Cardiac Enzymes: No results for input(s): CKTOTAL, CKMB, CKMBINDEX, TROPONINI in the last 168 hours. BNP: Invalid input(s): POCBNP CBG: No results for input(s): GLUCAP in the last 168 hours. HbA1C: No results for input(s): HGBA1C in the last 72 hours. Urine analysis:    Component Value Date/Time   COLORURINE YELLOW 10/09/2024 0406   APPEARANCEUR CLEAR 10/09/2024 0406   LABSPEC 1.006 10/09/2024 0406   PHURINE 7.0 10/09/2024 0406   GLUCOSEU NEGATIVE 10/09/2024 0406   HGBUR NEGATIVE 10/09/2024 0406   BILIRUBINUR NEGATIVE 10/09/2024 0406   KETONESUR NEGATIVE 10/09/2024 0406   PROTEINUR NEGATIVE 10/09/2024 0406   UROBILINOGEN 0.2 07/07/2014 1935   NITRITE NEGATIVE 10/09/2024 0406   LEUKOCYTESUR NEGATIVE 10/09/2024 0406   Sepsis Labs: @LABRCNTIP (procalcitonin:4,lacticidven:4) ) Recent Results (from the past 240 hours)  Resp panel by RT-PCR (RSV, Flu A&B, Covid) Anterior Nasal Swab     Status: None   Collection Time: 10/09/24 12:05 PM   Specimen: Anterior Nasal Swab  Result Value Ref Range Status   SARS Coronavirus 2 by RT PCR NEGATIVE NEGATIVE Final    Comment: (NOTE) SARS-CoV-2 target nucleic acids are NOT DETECTED.  The SARS-CoV-2 RNA is generally detectable in upper respiratory specimens during the acute phase of infection. The lowest concentration of SARS-CoV-2 viral copies this assay can detect is 138 copies/mL. A negative result  does not preclude SARS-Cov-2 infection and should not be used as the sole basis for treatment or other patient management decisions. A negative result may occur with  improper specimen collection/handling, submission of specimen other than nasopharyngeal swab, presence of viral mutation(s) within the areas targeted by this assay, and inadequate number of viral copies(<138 copies/mL). A negative result must be combined with clinical observations, patient history, and epidemiological information. The expected result is Negative.  Fact Sheet for Patients:  bloggercourse.com  Fact Sheet for Healthcare Providers:  seriousbroker.it  This test is no t yet approved or cleared by the [place]  FDA and  has been authorized for detection and/or diagnosis of SARS-CoV-2 by FDA under an Emergency Use Authorization (EUA). This EUA will remain  in effect (meaning this test can be used) for the duration of the COVID-19 declaration under Section 564(b)(1)  of the Act, 21 U.S.C.section 360bbb-3(b)(1), unless the authorization is terminated  or revoked sooner.       Influenza A by PCR NEGATIVE NEGATIVE Final   Influenza B by PCR NEGATIVE NEGATIVE Final    Comment: (NOTE) The Xpert Xpress SARS-CoV-2/FLU/RSV plus assay is intended as an aid in the diagnosis of influenza from Nasopharyngeal swab specimens and should not be used as a sole basis for treatment. Nasal washings and aspirates are unacceptable for Xpert Xpress SARS-CoV-2/FLU/RSV testing.  Fact Sheet for Patients: bloggercourse.com  Fact Sheet for Healthcare Providers: seriousbroker.it  This test is not yet approved or cleared by the [place]  FDA and has been authorized for detection and/or diagnosis of SARS-CoV-2 by FDA under an Emergency Use Authorization (EUA). This EUA will remain in effect (meaning this test can be used) for  the duration of the COVID-19 declaration under Section 564(b)(1) of the Act, 21 U.S.C. section 360bbb-3(b)(1), unless the authorization is terminated or revoked.     Resp Syncytial Virus by PCR NEGATIVE NEGATIVE Final    Comment: (NOTE) Fact Sheet for Patients: bloggercourse.com  Fact Sheet for Healthcare Providers: seriousbroker.it  This test is not yet approved or cleared by the [place]  FDA and has been authorized for detection and/or diagnosis of SARS-CoV-2 by FDA under an Emergency Use Authorization (EUA). This EUA will remain in effect (meaning this test can be used) for the duration of the COVID-19 declaration under Section 564(b)(1) of the Act, 21 U.S.C. section 360bbb-3(b)(1), unless the authorization is terminated or revoked.  Performed at [facility], [address], [place], [place] [address]   Respiratory (~20 pathogens) panel by PCR     Status: None   Collection Time: 10/09/24  1:15 PM   Specimen: Nasopharyngeal Swab; Respiratory  Result Value Ref Range Status   Adenovirus NOT DETECTED NOT DETECTED Final   Coronavirus 229E NOT DETECTED NOT DETECTED Final    Comment: (NOTE) The Coronavirus on the Respiratory Panel, DOES NOT test for the novel  Coronavirus (2019 nCoV)    Coronavirus HKU1 NOT DETECTED NOT DETECTED Final   Coronavirus NL63 NOT DETECTED NOT DETECTED Final   Coronavirus OC43 NOT DETECTED NOT DETECTED Final   Metapneumovirus NOT DETECTED NOT DETECTED Final   Rhinovirus / Enterovirus NOT DETECTED NOT DETECTED Final   Influenza A NOT DETECTED NOT DETECTED Final   Influenza B NOT DETECTED NOT DETECTED Final   Parainfluenza Virus 1 NOT DETECTED NOT DETECTED Final   Parainfluenza Virus 2 NOT DETECTED NOT DETECTED Final   Parainfluenza Virus 3 NOT DETECTED NOT DETECTED Final   Parainfluenza Virus 4 NOT DETECTED NOT DETECTED Final   Respiratory Syncytial Virus NOT DETECTED NOT DETECTED Final    Bordetella pertussis NOT DETECTED NOT DETECTED Final   Bordetella Parapertussis NOT DETECTED NOT DETECTED Final   Chlamydophila pneumoniae NOT DETECTED NOT DETECTED Final   Mycoplasma pneumoniae NOT DETECTED NOT DETECTED Final    Comment: Performed at [facility], [address] [place], [place] [address]     Scheduled Meds:  apixaban   5 mg Oral BID   atorvastatin   40 mg Oral Daily   budesonide -glycopyrrolate -formoterol   2 puff Inhalation BID   feeding supplement  237 mL Oral BID BM   ipratropium-albuterol   3 mL Nebulization Q8H   levothyroxine   100 mcg Oral QAC breakfast   methylPREDNISolone  (SOLU-MEDROL ) injection  40 mg Intravenous Q12H   pantoprazole   40 mg Oral QAC breakfast   Continuous Infusions:  Procedures/Studies: DG Chest Portable 1  View Result Date: 10/08/2024 EXAM: 1 VIEW(S) XRAY OF THE CHEST 10/08/2024 09:39:00 PM COMPARISON: 09/11/2024 CLINICAL HISTORY: shortness of breath FINDINGS: LINES, TUBES AND DEVICES: Loop recorder device noted. LUNGS AND PLEURA: There is mild central pulmonary vascular congestion/interstitial edema. There is some increasing hazy opacities in the right lower lobe, which may represent atelectasis or airspace disease. Stable right pleural effusion. No pneumothorax. HEART AND MEDIASTINUM: Stable cardiomegaly. Coronary artery stent in place. Cardiac valve prostheses in place. BONES AND SOFT TISSUES: Right chest wall surgical clips noted. Nondisplaced median sternotomy wires. No acute osseous abnormality. IMPRESSION: 1. Increasing hazy right lower lobe opacities, which may represent atelectasis or airspace disease. 2. Mild central pulmonary vascular congestion with interstitial edema. 3. Stable right pleural effusion. Electronically signed by: Greig Pique MD 10/08/2024 09:45 PM EST RP Workstation: [id number]   CUP PACEART REMOTE DEVICE CHECK Result Date: 09/16/2024 ILR summary report received. Battery status OK. Normal device function. No new  symptom, tachy, brady, or pause episodes. No new AF episodes. Monthly summary reports and ROV/PRN LA, CVRS  DG Chest Portable 1 View Result Date: 09/11/2024 EXAM: 1 VIEW(S) XRAY OF THE CHEST 09/11/2024 07:45:00 PM COMPARISON: 07/21/2024 CLINICAL HISTORY: hypoxia FINDINGS: LINES, TUBES AND DEVICES: Surgical clips in right chest wall/axilla. Implantable loop recorder in left chest. LUNGS AND PLEURA: Increased interstitial markings, worsened from prior. Nonspecific chronic blunting of the left costophrenic angle. Question of developing right mid lung zone airspace opacity. No pleural effusion. No pneumothorax. HEART AND MEDIASTINUM: Aortic valve replacements. Cardiomegaly with median sternotomy wires. BONES AND SOFT TISSUES: No acute osseous abnormality. IMPRESSION: 1. Question of developing right mid lung zone airspace opacity. 2. Increased interstitial markings, worsened from prior. Electronically signed by: Morgane Naveau MD 09/11/2024 07:58 PM EST RP Workstation: [id number]   CT ABDOMEN PELVIS W CONTRAST Result Date: 09/11/2024 EXAM: CT ABDOMEN AND PELVIS WITH CONTRAST 09/11/2024 06:03:00 PM TECHNIQUE: CT of the abdomen and pelvis was performed with the administration of 80 mL of iohexol  (OMNIPAQUE ) 300 MG/ML solution. Multiplanar reformatted images are provided for review. Automated exposure control, iterative reconstruction, and/or weight-based adjustment of the mA/kV was utilized to reduce the radiation dose to as low as reasonably achievable. COMPARISON: None available. CLINICAL HISTORY: LLQ abdominal pain. FINDINGS: LIMITATIONS/ARTIFACTS: Limited exam due to persistent motion artifact. LOWER CHEST: No acute abnormality. LIVER: The liver is unremarkable. GALLBLADDER AND BILE DUCTS: Gallbladder is unremarkable. No biliary ductal dilatation. SPLEEN: No acute abnormality. PANCREAS: No acute abnormality. ADRENAL GLANDS: No acute abnormality. KIDNEYS, URETERS AND BLADDER: Right kidney in particular is poorly  visualized. No stones in the kidneys or ureters. No hydronephrosis. No perinephric or periureteral stranding. Urinary bladder is unremarkable. GI AND BOWEL: Stomach demonstrates no acute abnormality. Sigmoid diverticulosis without inflammation. There is no bowel obstruction. PERITONEUM AND RETROPERITONEUM: No ascites. No free air. VASCULATURE: Aortic atherosclerosis. Aorta is normal in caliber. LYMPH NODES: No lymphadenopathy. REPRODUCTIVE ORGANS: No acute abnormality. BONES AND SOFT TISSUES: No acute osseous abnormality. No focal soft tissue abnormality. IMPRESSION: 1. Limited exam due to persistent motion artifact, with the right kidney in particular poorly visualized. 2. Sigmoid diverticulosis without inflammation. 3. Aortic atherosclerosis. Electronically signed by: Lynwood Seip MD 09/11/2024 06:16 PM EST RP Workstation: [id number]    Alm Schneider, DO  [facility]  If 7PM-7AM, please contact night-coverage [email or web address] Password [email or web address] 10/10/2024, 2:23 PM   LOS: 2 days   "

## 2024-10-11 DIAGNOSIS — I480 Paroxysmal atrial fibrillation: Secondary | ICD-10-CM | POA: Diagnosis not present

## 2024-10-11 DIAGNOSIS — J441 Chronic obstructive pulmonary disease with (acute) exacerbation: Secondary | ICD-10-CM | POA: Diagnosis not present

## 2024-10-11 DIAGNOSIS — E871 Hypo-osmolality and hyponatremia: Secondary | ICD-10-CM

## 2024-10-11 DIAGNOSIS — I5033 Acute on chronic diastolic (congestive) heart failure: Secondary | ICD-10-CM | POA: Diagnosis not present

## 2024-10-11 LAB — BASIC METABOLIC PANEL WITH GFR
Anion gap: 21 — ABNORMAL HIGH (ref 5–15)
BUN: 23 mg/dL (ref 8–23)
CO2: 20 mmol/L — ABNORMAL LOW (ref 22–32)
Calcium: 8.6 mg/dL — ABNORMAL LOW (ref 8.9–10.3)
Chloride: 88 mmol/L — ABNORMAL LOW (ref 98–111)
Creatinine, Ser: 1.38 mg/dL — ABNORMAL HIGH (ref 0.44–1.00)
GFR, Estimated: 39 mL/min — ABNORMAL LOW
Glucose, Bld: 253 mg/dL — ABNORMAL HIGH (ref 70–99)
Potassium: 4.8 mmol/L (ref 3.5–5.1)
Sodium: 129 mmol/L — ABNORMAL LOW (ref 135–145)

## 2024-10-11 LAB — MAGNESIUM: Magnesium: 2.1 mg/dL (ref 1.7–2.4)

## 2024-10-11 LAB — GLUCOSE, CAPILLARY
Glucose-Capillary: 230 mg/dL — ABNORMAL HIGH (ref 70–99)
Glucose-Capillary: 315 mg/dL — ABNORMAL HIGH (ref 70–99)
Glucose-Capillary: 265 mg/dL — ABNORMAL HIGH (ref 70–99)
Glucose-Capillary: 267 mg/dL — ABNORMAL HIGH (ref 70–99)

## 2024-10-11 MED ORDER — FUROSEMIDE 10 MG/ML IJ SOLN
60.0000 mg | Freq: Once | INTRAMUSCULAR | Status: AC
  Administered 2024-10-11: 60 mg via INTRAVENOUS
  Filled 2024-10-11: qty 6

## 2024-10-11 MED ORDER — INSULIN ASPART 100 UNIT/ML IJ SOLN
4.0000 [IU] | Freq: Three times a day (TID) | INTRAMUSCULAR | Status: DC
  Administered 2024-10-11 – 2024-10-12 (×5): 4 [IU] via SUBCUTANEOUS
  Filled 2024-10-11 (×6): qty 1

## 2024-10-11 NOTE — Progress Notes (Signed)
 [facility] Liaison  This patient is a current outpatient palliative care with [facility].  Hospital Liaisons will continue to follow for discharge disposition.  Please call with any hospice or outpatient palliative care needs or concerns.  Thank you, Inocente Jacobs RN BSN [facility] Liaison [phone]

## 2024-10-11 NOTE — Plan of Care (Signed)

## 2024-10-11 NOTE — Progress Notes (Signed)
 "          PROGRESS NOTE  [name] FMW:[id number] DOB: 06-20-1946 DOA: 10/08/2024 PCP: Sheryle Carwin, MD  Brief History:  79 y.o. female with medical history significant for paroxysmal A-fib on Eliquis , chronic HFpEF, prior CVA, carotid artery stenosis, valvular heart disease, CKD 3B, hypertension, hyperlipidemia, type 2 diabetes, hypothyroidism, COPD, bioprosthetic aortic and mitral valve 07/18/2013 presenting with shortness of breath that began on the evening of 10/09/2023.  She has noted some lower extremity edema.  She denies any chest pain, fever, chills, nausea, vomiting, diarrhea.  She has a nonproductive cough. In the ED, the patient was afebrile and hemodynamically stable with oxygen  saturation 87% room air.  She was placed on 2 L with saturation up to 96%. WBC 5.7, hemoglobin 10.7, platelets 143.  Sodium 133, potassium 3.8, bicarbonate 30, serum creatinine 1.55.  Chest x-ray showed hazy right lower lobe opacity.  There was mild vascular congestion.  The patient was started on IV furosemide ..  Notably, the patient had a hospital admission from 09/11/2024 to 09/17/2024 during which time she was treated for COPD exacerbation and acute on chronic HFpEF.  She was discharged home with torsemide  20 mg daily.   Assessment/Plan: Acute respiratory failure with hypoxia - Multifactorial including COPD exacerbation and CHF - Stable on 2 L nasal cannula - Wean oxygen  to room air for saturation greater 92% - 1/8 CT chest--new trace L-pleural eff and small loculated R-effusion; bilateral GGO   Acute on chronic HFpEF - 07/22/2024 echo EF 60 to 65%, no WMA, normal RVF, status post bioprosthetic AVR and MVR - Continue IV furosemide --given spot dosing each day - 1/9--give lasix  60 mg IV x 1 - Daily weights--NEG 2 lb - Accurate I's and O's--incomplete  Hyponatremia -in part due to hyperglycemia -serum osm -urine osm -urine Na -1/9 corrected Na = 131   CKD stage IIIb - Baseline creatinine  1.1-1.4 - Monitor with diuresis   Paroxysmal atrial fibrillation - Continue apixaban  - continue amiodarone  - currently in sinus   Elevated troponin - Troponin trend is flat, not consistent with ACS - Troponin 42>>41   Mixed hyperlipidemia - Continue statin   Diabetes mellitus type 2 - NovoLog  sliding scale - 09/12/2024 hemoglobin A1c 6.3 -  elevated CBGs secondary to steroids - novolog  sliding scale - add novolog  4 units with meals   Elevated D-dimer -very low clinical suspicion of PE -pt compliant with apixaban              Family Communication:   spouse at bedside 1/9   Consultants:  none   Code Status:  DNR   DVT Prophylaxis: apixaban      Procedures: As Listed in Progress Note Above   Antibiotics: None            Subjective: Patient denies fevers, chills, headache, chest pain, dyspnea, nausea, vomiting, diarrhea, abdominal pain, dysuria, hematuria, hematochezia, and melena. She states her sob is improving  Objective: Vitals:   10/11/24 0529 10/11/24 0913 10/11/24 1438 10/11/24 1442  BP: 116/62  (!) 140/123 (!) 146/67  Pulse: 75  87 83  Resp:   15 20  Temp: 98.2 F (36.8 C)  98.6 F (37 C)   TempSrc: Oral  Oral   SpO2: 99% (!) 82% 95%   Weight: 68.1 kg     Height:        Intake/Output Summary (Last 24 hours) at 10/11/2024 1618 Last data filed at 10/10/2024 1800 Gross per 24 hour  Intake 240 ml  Output --  Net 240 ml   Weight change: -0.1 kg Exam:  General:  Pt is alert, follows commands appropriately, not in acute distress HEENT: No icterus, No thrush, No neck mass, [place]/AT Cardiovascular: RRR, S1/S2, no rubs, no gallops Respiratory:bibasilar crackles.  No wheeze Abdomen: Soft/+BS, non tender, non distended, no guarding Extremities: 1 + LE edema, No lymphangitis, No petechiae, No rashes, no synovitis   Data Reviewed: I have personally reviewed following labs and imaging studies Basic Metabolic Panel: Recent Labs  Lab  10/08/24 2147 10/09/24 0021 10/09/24 0427 10/10/24 0431 10/11/24 0438  NA 133*  --  136 131* 129*  K 3.8  --  3.1* 4.5 4.8  CL 91*  --  94* 93* 88*  CO2 30  --  33* 22 20*  GLUCOSE 122*  --  97 207* 253*  BUN 16  --  15 18 23   CREATININE 1.55*  --  1.48* 1.53* 1.38*  CALCIUM  8.4*  --  8.0* 8.4* 8.6*  MG  --  1.3*  --  1.9 2.1  PHOS  --  2.5  --   --   --    Liver Function Tests: No results for input(s): AST, ALT, ALKPHOS, BILITOT, PROT, ALBUMIN  in the last 168 hours. No results for input(s): LIPASE, AMYLASE in the last 168 hours. No results for input(s): AMMONIA in the last 168 hours. Coagulation Profile: No results for input(s): INR, PROTIME in the last 168 hours. CBC: Recent Labs  Lab 10/08/24 2147 10/09/24 0427  WBC 5.7 5.4  HGB 10.7* 10.6*  HCT 33.9* 34.2*  MCV 85.6 84.9  PLT 143* 138*   Cardiac Enzymes: No results for input(s): CKTOTAL, CKMB, CKMBINDEX, TROPONINI in the last 168 hours. BNP: Invalid input(s): POCBNP CBG: Recent Labs  Lab 10/10/24 1655 10/10/24 2021 10/11/24 0739 10/11/24 1136  GLUCAP 232* 271* 230* 315*   HbA1C: No results for input(s): HGBA1C in the last 72 hours. Urine analysis:    Component Value Date/Time   COLORURINE YELLOW 10/09/2024 0406   APPEARANCEUR CLEAR 10/09/2024 0406   LABSPEC 1.006 10/09/2024 0406   PHURINE 7.0 10/09/2024 0406   GLUCOSEU NEGATIVE 10/09/2024 0406   HGBUR NEGATIVE 10/09/2024 0406   BILIRUBINUR NEGATIVE 10/09/2024 0406   KETONESUR NEGATIVE 10/09/2024 0406   PROTEINUR NEGATIVE 10/09/2024 0406   UROBILINOGEN 0.2 07/07/2014 1935   NITRITE NEGATIVE 10/09/2024 0406   LEUKOCYTESUR NEGATIVE 10/09/2024 0406   Sepsis Labs: @LABRCNTIP (procalcitonin:4,lacticidven:4) ) Recent Results (from the past 240 hours)  Resp panel by RT-PCR (RSV, Flu A&B, Covid) Anterior Nasal Swab     Status: None   Collection Time: 10/09/24 12:05 PM   Specimen: Anterior Nasal Swab  Result Value Ref  Range Status   SARS Coronavirus 2 by RT PCR NEGATIVE NEGATIVE Final    Comment: (NOTE) SARS-CoV-2 target nucleic acids are NOT DETECTED.  The SARS-CoV-2 RNA is generally detectable in upper respiratory specimens during the acute phase of infection. The lowest concentration of SARS-CoV-2 viral copies this assay can detect is 138 copies/mL. A negative result does not preclude SARS-Cov-2 infection and should not be used as the sole basis for treatment or other patient management decisions. A negative result may occur with  improper specimen collection/handling, submission of specimen other than nasopharyngeal swab, presence of viral mutation(s) within the areas targeted by this assay, and inadequate number of viral copies(<138 copies/mL). A negative result must be combined with clinical observations, patient history, and epidemiological information. The expected result is Negative.  Fact Sheet for  Patients:  bloggercourse.com  Fact Sheet for Healthcare Providers:  seriousbroker.it  This test is no t yet approved or cleared by the [place]  FDA and  has been authorized for detection and/or diagnosis of SARS-CoV-2 by FDA under an Emergency Use Authorization (EUA). This EUA will remain  in effect (meaning this test can be used) for the duration of the COVID-19 declaration under Section 564(b)(1) of the Act, 21 U.S.C.section 360bbb-3(b)(1), unless the authorization is terminated  or revoked sooner.       Influenza A by PCR NEGATIVE NEGATIVE Final   Influenza B by PCR NEGATIVE NEGATIVE Final    Comment: (NOTE) The Xpert Xpress SARS-CoV-2/FLU/RSV plus assay is intended as an aid in the diagnosis of influenza from Nasopharyngeal swab specimens and should not be used as a sole basis for treatment. Nasal washings and aspirates are unacceptable for Xpert Xpress SARS-CoV-2/FLU/RSV testing.  Fact Sheet for  Patients: bloggercourse.com  Fact Sheet for Healthcare Providers: seriousbroker.it  This test is not yet approved or cleared by the [place]  FDA and has been authorized for detection and/or diagnosis of SARS-CoV-2 by FDA under an Emergency Use Authorization (EUA). This EUA will remain in effect (meaning this test can be used) for the duration of the COVID-19 declaration under Section 564(b)(1) of the Act, 21 U.S.C. section 360bbb-3(b)(1), unless the authorization is terminated or revoked.     Resp Syncytial Virus by PCR NEGATIVE NEGATIVE Final    Comment: (NOTE) Fact Sheet for Patients: bloggercourse.com  Fact Sheet for Healthcare Providers: seriousbroker.it  This test is not yet approved or cleared by the [place]  FDA and has been authorized for detection and/or diagnosis of SARS-CoV-2 by FDA under an Emergency Use Authorization (EUA). This EUA will remain in effect (meaning this test can be used) for the duration of the COVID-19 declaration under Section 564(b)(1) of the Act, 21 U.S.C. section 360bbb-3(b)(1), unless the authorization is terminated or revoked.  Performed at [facility], [address], [place], [place] [address]   Respiratory (~20 pathogens) panel by PCR     Status: None   Collection Time: 10/09/24  1:15 PM   Specimen: Nasopharyngeal Swab; Respiratory  Result Value Ref Range Status   Adenovirus NOT DETECTED NOT DETECTED Final   Coronavirus 229E NOT DETECTED NOT DETECTED Final    Comment: (NOTE) The Coronavirus on the Respiratory Panel, DOES NOT test for the novel  Coronavirus (2019 nCoV)    Coronavirus HKU1 NOT DETECTED NOT DETECTED Final   Coronavirus NL63 NOT DETECTED NOT DETECTED Final   Coronavirus OC43 NOT DETECTED NOT DETECTED Final   Metapneumovirus NOT DETECTED NOT DETECTED Final   Rhinovirus / Enterovirus NOT DETECTED NOT DETECTED Final    Influenza A NOT DETECTED NOT DETECTED Final   Influenza B NOT DETECTED NOT DETECTED Final   Parainfluenza Virus 1 NOT DETECTED NOT DETECTED Final   Parainfluenza Virus 2 NOT DETECTED NOT DETECTED Final   Parainfluenza Virus 3 NOT DETECTED NOT DETECTED Final   Parainfluenza Virus 4 NOT DETECTED NOT DETECTED Final   Respiratory Syncytial Virus NOT DETECTED NOT DETECTED Final   Bordetella pertussis NOT DETECTED NOT DETECTED Final   Bordetella Parapertussis NOT DETECTED NOT DETECTED Final   Chlamydophila pneumoniae NOT DETECTED NOT DETECTED Final   Mycoplasma pneumoniae NOT DETECTED NOT DETECTED Final    Comment: Performed at [facility], [address], [place], [place] [address]     Scheduled Meds:  amiodarone   200 mg Oral Daily   apixaban   5 mg Oral BID   atorvastatin   40 mg Oral Daily   budesonide -glycopyrrolate -formoterol   2 puff Inhalation BID   feeding supplement  237 mL Oral BID BM   insulin  aspart  0-5 Units Subcutaneous QHS   insulin  aspart  0-9 Units Subcutaneous TID WC   insulin  aspart  4 Units Subcutaneous TID WC   ipratropium-albuterol   3 mL Nebulization Q8H   levothyroxine   100 mcg Oral QAC breakfast   pantoprazole   40 mg Oral QAC breakfast   Continuous Infusions:  Procedures/Studies: CT CHEST WO CONTRAST Result Date: 10/10/2024 EXAM: CT CHEST WITHOUT CONTRAST 10/10/2024 04:28:35 PM TECHNIQUE: CT of the chest was performed without the administration of intravenous contrast. Multiplanar reformatted images are provided for review. Automated exposure control, iterative reconstruction, and/or weight based adjustment of the mA/kV was utilized to reduce the radiation dose to as low as reasonably achievable. COMPARISON: CT angiogram chest 07/21/2024 and chest x ray 10/18/2024. CLINICAL HISTORY: Respiratory illness, nondiagnostic xray. FINDINGS: MEDIASTINUM: The heart is moderately enlarged. Coronary and aortic atherosclerotic calcifications are noted. The central airways  are clear. LYMPH NODES: Enlarged subcarinal lymph nodes measuring up to 13 mm appear unchanged. Mildly enlarged right hilar lymph nodes are grossly unchanged, but not well delineated secondary to lack of intravenous contrast. Enlarged left hilar lymph nodes measure up to 16 mm and appear unchanged. Mildly enlarged paratracheal and perivascular lymph nodes also appear unchanged. LUNGS AND PLEURA: There is a new trace left pleural effusion. There is a new small right pleural effusion which is partially loculated. Bilateral pleural calcifications are again seen, left greater than right. Numerous calcified granulomas are again seen throughout both lungs. There is some new atelectasis in the right lower lobe. There are mild ground glass opacities throughout both lungs which appears similar to prior and are nonspecific, possibly related to mild edema or infection/inflammation. No pneumothorax. SOFT TISSUES/BONES: Surgical clips in the right axilla. Sternotomy wires are present. UPPER ABDOMEN: Limited images of the upper abdomen demonstrates no acute abnormality. IMPRESSION: 1. New trace left pleural effusion and new small partially loculated right pleural effusion. 2. Mild ground glass opacities throughout both lungs, similar to prior, nonspecific, possibly related to mild edema or infection/inflammation. 3. Enlarged subcarinal, right hilar, left hilar, paratracheal, and perivascular lymph nodes, unchanged. 4. Moderate cardiomegaly with coronary and aortic atherosclerotic calcifications. Electronically signed by: Greig Pique MD MD 10/10/2024 08:01 PM EST RP Workstation: [id number]   DG Chest Portable 1 View Result Date: 10/08/2024 EXAM: 1 VIEW(S) XRAY OF THE CHEST 10/08/2024 09:39:00 PM COMPARISON: 09/11/2024 CLINICAL HISTORY: shortness of breath FINDINGS: LINES, TUBES AND DEVICES: Loop recorder device noted. LUNGS AND PLEURA: There is mild central pulmonary vascular congestion/interstitial edema. There is some  increasing hazy opacities in the right lower lobe, which may represent atelectasis or airspace disease. Stable right pleural effusion. No pneumothorax. HEART AND MEDIASTINUM: Stable cardiomegaly. Coronary artery stent in place. Cardiac valve prostheses in place. BONES AND SOFT TISSUES: Right chest wall surgical clips noted. Nondisplaced median sternotomy wires. No acute osseous abnormality. IMPRESSION: 1. Increasing hazy right lower lobe opacities, which may represent atelectasis or airspace disease. 2. Mild central pulmonary vascular congestion with interstitial edema. 3. Stable right pleural effusion. Electronically signed by: Greig Pique MD 10/08/2024 09:45 PM EST RP Workstation: [id number]   CUP PACEART REMOTE DEVICE CHECK Result Date: 09/16/2024 ILR summary report received. Battery status OK. Normal device function. No new symptom, tachy, brady, or pause episodes. No new AF episodes. Monthly summary reports and ROV/PRN LA, CVRS  DG Chest Portable 1 View Result Date: 09/11/2024 EXAM: 1 VIEW(S) XRAY OF THE CHEST 09/11/2024 07:45:00 PM COMPARISON: 07/21/2024 CLINICAL HISTORY: hypoxia FINDINGS: LINES, TUBES AND DEVICES: Surgical clips in right chest wall/axilla. Implantable loop recorder in left chest. LUNGS AND PLEURA: Increased interstitial markings, worsened from prior. Nonspecific chronic blunting of the left costophrenic angle. Question of developing right mid lung zone airspace opacity. No pleural effusion. No pneumothorax. HEART AND MEDIASTINUM: Aortic valve replacements. Cardiomegaly with median sternotomy wires. BONES AND SOFT TISSUES: No acute osseous abnormality. IMPRESSION: 1. Question of developing right mid lung zone airspace opacity. 2. Increased interstitial markings, worsened from prior. Electronically signed by: Morgane Naveau MD 09/11/2024 07:58 PM EST RP Workstation: [id number]   CT ABDOMEN PELVIS W CONTRAST Result Date: 09/11/2024 EXAM: CT ABDOMEN AND PELVIS WITH CONTRAST  09/11/2024 06:03:00 PM TECHNIQUE: CT of the abdomen and pelvis was performed with the administration of 80 mL of iohexol  (OMNIPAQUE ) 300 MG/ML solution. Multiplanar reformatted images are provided for review. Automated exposure control, iterative reconstruction, and/or weight-based adjustment of the mA/kV was utilized to reduce the radiation dose to as low as reasonably achievable. COMPARISON: None available. CLINICAL HISTORY: LLQ abdominal pain. FINDINGS: LIMITATIONS/ARTIFACTS: Limited exam due to persistent motion artifact. LOWER CHEST: No acute abnormality. LIVER: The liver is unremarkable. GALLBLADDER AND BILE DUCTS: Gallbladder is unremarkable. No biliary ductal dilatation. SPLEEN: No acute abnormality. PANCREAS: No acute abnormality. ADRENAL GLANDS: No acute abnormality. KIDNEYS, URETERS AND BLADDER: Right kidney in particular is poorly visualized. No stones in the kidneys or ureters. No hydronephrosis. No perinephric or periureteral stranding. Urinary bladder is unremarkable. GI AND BOWEL: Stomach demonstrates no acute abnormality. Sigmoid diverticulosis without inflammation. There is no bowel obstruction. PERITONEUM AND RETROPERITONEUM: No ascites. No free air. VASCULATURE: Aortic atherosclerosis. Aorta is normal in caliber. LYMPH NODES: No lymphadenopathy. REPRODUCTIVE ORGANS: No acute abnormality. BONES AND SOFT TISSUES: No acute osseous abnormality. No focal soft tissue abnormality. IMPRESSION: 1. Limited exam due to persistent motion artifact, with the right kidney in particular poorly visualized. 2. Sigmoid diverticulosis without inflammation. 3. Aortic atherosclerosis. Electronically signed by: Lynwood Seip MD 09/11/2024 06:16 PM EST RP Workstation: [id number]    Alm Schneider, DO  [facility]  If 7PM-7AM, please contact night-coverage [email or web address] Password [email or web address] 10/11/2024, 4:18 PM   LOS: 3 days   "

## 2024-10-12 DIAGNOSIS — E871 Hypo-osmolality and hyponatremia: Secondary | ICD-10-CM | POA: Diagnosis not present

## 2024-10-12 DIAGNOSIS — I5033 Acute on chronic diastolic (congestive) heart failure: Secondary | ICD-10-CM | POA: Diagnosis not present

## 2024-10-12 DIAGNOSIS — I480 Paroxysmal atrial fibrillation: Secondary | ICD-10-CM | POA: Diagnosis not present

## 2024-10-12 LAB — BASIC METABOLIC PANEL WITH GFR
Anion gap: 9 (ref 5–15)
BUN: 28 mg/dL — ABNORMAL HIGH (ref 8–23)
CO2: 31 mmol/L (ref 22–32)
Calcium: 8.8 mg/dL — ABNORMAL LOW (ref 8.9–10.3)
Chloride: 88 mmol/L — ABNORMAL LOW (ref 98–111)
Creatinine, Ser: 1.37 mg/dL — ABNORMAL HIGH (ref 0.44–1.00)
GFR, Estimated: 39 mL/min — ABNORMAL LOW
Glucose, Bld: 129 mg/dL — ABNORMAL HIGH (ref 70–99)
Potassium: 4.4 mmol/L (ref 3.5–5.1)
Sodium: 128 mmol/L — ABNORMAL LOW (ref 135–145)

## 2024-10-12 LAB — GLUCOSE, CAPILLARY
Glucose-Capillary: 150 mg/dL — ABNORMAL HIGH (ref 70–99)
Glucose-Capillary: 240 mg/dL — ABNORMAL HIGH (ref 70–99)
Glucose-Capillary: 190 mg/dL — ABNORMAL HIGH (ref 70–99)
Glucose-Capillary: 107 mg/dL — ABNORMAL HIGH (ref 70–99)

## 2024-10-12 LAB — OSMOLALITY, URINE: Osmolality, Ur: 194 mosm/kg — ABNORMAL LOW (ref 300–900)

## 2024-10-12 LAB — MAGNESIUM: Magnesium: 2.4 mg/dL (ref 1.7–2.4)

## 2024-10-12 LAB — URINALYSIS, COMPLETE (UACMP) WITH MICROSCOPIC
Bilirubin Urine: NEGATIVE
Glucose, UA: NEGATIVE mg/dL
Ketones, ur: NEGATIVE mg/dL
Nitrite: NEGATIVE
Protein, ur: NEGATIVE mg/dL
Specific Gravity, Urine: 1.004 — ABNORMAL LOW (ref 1.005–1.030)
pH: 7 (ref 5.0–8.0)

## 2024-10-12 LAB — OSMOLALITY: Osmolality: 279 mosm/kg (ref 275–295)

## 2024-10-12 LAB — SODIUM, URINE, RANDOM: Sodium, Ur: 38 mmol/L

## 2024-10-12 MED ORDER — FUROSEMIDE 10 MG/ML IJ SOLN
80.0000 mg | Freq: Once | INTRAMUSCULAR | Status: AC
  Administered 2024-10-12: 80 mg via INTRAVENOUS
  Filled 2024-10-12: qty 8

## 2024-10-12 MED ORDER — IPRATROPIUM-ALBUTEROL 0.5-2.5 (3) MG/3ML IN SOLN
3.0000 mL | Freq: Three times a day (TID) | RESPIRATORY_TRACT | Status: DC
  Administered 2024-10-13 – 2024-10-19 (×18): 3 mL via RESPIRATORY_TRACT
  Filled 2024-10-12 (×18): qty 3

## 2024-10-12 MED ORDER — GUAIFENESIN-DM 100-10 MG/5ML PO SYRP
5.0000 mL | ORAL_SOLUTION | ORAL | Status: DC | PRN
  Administered 2024-10-12 – 2024-10-25 (×17): 5 mL via ORAL
  Filled 2024-10-12 (×16): qty 5

## 2024-10-12 NOTE — Progress Notes (Signed)
 Walked with assist to bathroom for bm.  Received another dose of robitussin for cough.  Husband at bedside.

## 2024-10-12 NOTE — Progress Notes (Signed)
 "          PROGRESS NOTE  [name] FMW:[id number] DOB: 31-Oct-1945 DOA: 10/08/2024 PCP: Sheryle Carwin, MD  Brief History:  79 y.o. female with medical history significant for paroxysmal A-fib on Eliquis , chronic HFpEF, prior CVA, carotid artery stenosis, valvular heart disease, CKD 3B, hypertension, hyperlipidemia, type 2 diabetes, hypothyroidism, COPD, bioprosthetic aortic and mitral valve 07/18/2013 presenting with shortness of breath that began on the evening of 10/09/2023.  She has noted some lower extremity edema.  She denies any chest pain, fever, chills, nausea, vomiting, diarrhea.  She has a nonproductive cough. In the ED, the patient was afebrile and hemodynamically stable with oxygen  saturation 87% room air.  She was placed on 2 L with saturation up to 96%. WBC 5.7, hemoglobin 10.7, platelets 143.  Sodium 133, potassium 3.8, bicarbonate 30, serum creatinine 1.55.  Chest x-ray showed hazy right lower lobe opacity.  There was mild vascular congestion.  The patient was started on IV furosemide ..  Notably, the patient had a hospital admission from 09/11/2024 to 09/17/2024 during which time she was treated for COPD exacerbation and acute on chronic HFpEF.  She was discharged home with torsemide  20 mg daily.   Assessment/Plan:  Acute respiratory failure with hypoxia - Multifactorial including COPD exacerbation and CHF - Stable on 2 L nasal cannula - Wean oxygen  to room air for saturation greater 92% - 1/8 CT chest--new trace L-pleural eff and small loculated R-effusion; bilateral GGO   Acute on chronic HFpEF - 07/22/2024 echo EF 60 to 65%, no WMA, normal RVF, status post bioprosthetic AVR and MVR - Continue IV furosemide --given spot dosing each day - 1/9--give lasix  60 mg IV x 1 - 1/10--give lasix  80 mg IV - Daily weights--NEG 2 lb - Accurate I's and O's--incomplete   Hyponatremia -in part due to hyperglycemia -serum osm 279 -urine osm -urine Na   CKD stage IIIb - Baseline  creatinine 1.1-1.4 - Monitor with diuresis   Paroxysmal atrial fibrillation - Continue apixaban  - continue amiodarone  - currently in sinus   Elevated troponin - Troponin trend is flat, not consistent with ACS - Troponin 42>>41   Mixed hyperlipidemia - Continue statin   Diabetes mellitus type 2 - NovoLog  sliding scale - 09/12/2024 hemoglobin A1c 6.3 -  elevated CBGs secondary to steroids - novolog  sliding scale - add novolog  4 units with meals   Elevated D-dimer -very low clinical suspicion of PE -pt compliant with apixaban   Social -spouse has decided he cannot take care of pt at home, ok with pt going to SNF -request repeat PT eval             Family Communication:   spouse at bedside 1/10   Consultants:  none   Code Status:  DNR   DVT Prophylaxis: apixaban      Procedures: As Listed in Progress Note Above   Antibiotics: None         Subjective: Pt has bad dry cough.  Denies f/c,cp.  Has sob with mininmal exertion.  Denies n/v/d, abd pain  Objective: Vitals:   10/12/24 0500 10/12/24 0910 10/12/24 0911 10/12/24 1310  BP:    (!) 146/73  Pulse:    87  Resp:    17  Temp:    97.9 F (36.6 C)  TempSrc:    Oral  SpO2:  95% 99% 96%  Weight: 69 kg     Height:        Intake/Output Summary (Last 24 hours) at 10/12/2024 1442  Last data filed at 10/12/2024 0500 Gross per 24 hour  Intake 480 ml  Output 850 ml  Net -370 ml   Weight change: 0.9 kg Exam:  General:  Pt is alert, follows commands appropriately, not in acute distress HEENT: No icterus, No thrush, No neck mass, [place]/AT Cardiovascular: RRR, S1/S2, no rubs, no gallops Respiratory: bibasilar crackles.  No wheeze Abdomen: Soft/+BS, non tender, non distended, no guarding Extremities: 1 + LE edema, No lymphangitis, No petechiae, No rashes, no synovitis   Data Reviewed: I have personally reviewed following labs and imaging studies Basic Metabolic Panel: Recent Labs  Lab 10/08/24 2147  10/09/24 0021 10/09/24 0427 10/10/24 0431 10/11/24 0438 10/12/24 0546  NA 133*  --  136 131* 129* 128*  K 3.8  --  3.1* 4.5 4.8 4.4  CL 91*  --  94* 93* 88* 88*  CO2 30  --  33* 22 20* 31  GLUCOSE 122*  --  97 207* 253* 129*  BUN 16  --  15 18 23  28*  CREATININE 1.55*  --  1.48* 1.53* 1.38* 1.37*  CALCIUM  8.4*  --  8.0* 8.4* 8.6* 8.8*  MG  --  1.3*  --  1.9 2.1 2.4  PHOS  --  2.5  --   --   --   --    Liver Function Tests: No results for input(s): AST, ALT, ALKPHOS, BILITOT, PROT, ALBUMIN  in the last 168 hours. No results for input(s): LIPASE, AMYLASE in the last 168 hours. No results for input(s): AMMONIA in the last 168 hours. Coagulation Profile: No results for input(s): INR, PROTIME in the last 168 hours. CBC: Recent Labs  Lab 10/08/24 2147 10/09/24 0427  WBC 5.7 5.4  HGB 10.7* 10.6*  HCT 33.9* 34.2*  MCV 85.6 84.9  PLT 143* 138*   Cardiac Enzymes: No results for input(s): CKTOTAL, CKMB, CKMBINDEX, TROPONINI in the last 168 hours. BNP: Invalid input(s): POCBNP CBG: Recent Labs  Lab 10/11/24 1136 10/11/24 1638 10/11/24 1927 10/12/24 0738 10/12/24 1206  GLUCAP 315* 265* 267* 150* 240*   HbA1C: No results for input(s): HGBA1C in the last 72 hours. Urine analysis:    Component Value Date/Time   COLORURINE YELLOW 10/09/2024 0406   APPEARANCEUR CLEAR 10/09/2024 0406   LABSPEC 1.006 10/09/2024 0406   PHURINE 7.0 10/09/2024 0406   GLUCOSEU NEGATIVE 10/09/2024 0406   HGBUR NEGATIVE 10/09/2024 0406   BILIRUBINUR NEGATIVE 10/09/2024 0406   KETONESUR NEGATIVE 10/09/2024 0406   PROTEINUR NEGATIVE 10/09/2024 0406   UROBILINOGEN 0.2 07/07/2014 1935   NITRITE NEGATIVE 10/09/2024 0406   LEUKOCYTESUR NEGATIVE 10/09/2024 0406   Sepsis Labs: @LABRCNTIP (procalcitonin:4,lacticidven:4) ) Recent Results (from the past 240 hours)  Resp panel by RT-PCR (RSV, Flu A&B, Covid) Anterior Nasal Swab     Status: None   Collection Time:  10/09/24 12:05 PM   Specimen: Anterior Nasal Swab  Result Value Ref Range Status   SARS Coronavirus 2 by RT PCR NEGATIVE NEGATIVE Final    Comment: (NOTE) SARS-CoV-2 target nucleic acids are NOT DETECTED.  The SARS-CoV-2 RNA is generally detectable in upper respiratory specimens during the acute phase of infection. The lowest concentration of SARS-CoV-2 viral copies this assay can detect is 138 copies/mL. A negative result does not preclude SARS-Cov-2 infection and should not be used as the sole basis for treatment or other patient management decisions. A negative result may occur with  improper specimen collection/handling, submission of specimen other than nasopharyngeal swab, presence of viral mutation(s) within the areas  targeted by this assay, and inadequate number of viral copies(<138 copies/mL). A negative result must be combined with clinical observations, patient history, and epidemiological information. The expected result is Negative.  Fact Sheet for Patients:  bloggercourse.com  Fact Sheet for Healthcare Providers:  seriousbroker.it  This test is no t yet approved or cleared by the [place]  FDA and  has been authorized for detection and/or diagnosis of SARS-CoV-2 by FDA under an Emergency Use Authorization (EUA). This EUA will remain  in effect (meaning this test can be used) for the duration of the COVID-19 declaration under Section 564(b)(1) of the Act, 21 U.S.C.section 360bbb-3(b)(1), unless the authorization is terminated  or revoked sooner.       Influenza A by PCR NEGATIVE NEGATIVE Final   Influenza B by PCR NEGATIVE NEGATIVE Final    Comment: (NOTE) The Xpert Xpress SARS-CoV-2/FLU/RSV plus assay is intended as an aid in the diagnosis of influenza from Nasopharyngeal swab specimens and should not be used as a sole basis for treatment. Nasal washings and aspirates are unacceptable for Xpert Xpress  SARS-CoV-2/FLU/RSV testing.  Fact Sheet for Patients: bloggercourse.com  Fact Sheet for Healthcare Providers: seriousbroker.it  This test is not yet approved or cleared by the [place]  FDA and has been authorized for detection and/or diagnosis of SARS-CoV-2 by FDA under an Emergency Use Authorization (EUA). This EUA will remain in effect (meaning this test can be used) for the duration of the COVID-19 declaration under Section 564(b)(1) of the Act, 21 U.S.C. section 360bbb-3(b)(1), unless the authorization is terminated or revoked.     Resp Syncytial Virus by PCR NEGATIVE NEGATIVE Final    Comment: (NOTE) Fact Sheet for Patients: bloggercourse.com  Fact Sheet for Healthcare Providers: seriousbroker.it  This test is not yet approved or cleared by the [place]  FDA and has been authorized for detection and/or diagnosis of SARS-CoV-2 by FDA under an Emergency Use Authorization (EUA). This EUA will remain in effect (meaning this test can be used) for the duration of the COVID-19 declaration under Section 564(b)(1) of the Act, 21 U.S.C. section 360bbb-3(b)(1), unless the authorization is terminated or revoked.  Performed at [facility], [address] [place], [place] [address]   Respiratory (~20 pathogens) panel by PCR     Status: None   Collection Time: 10/09/24  1:15 PM   Specimen: Nasopharyngeal Swab; Respiratory  Result Value Ref Range Status   Adenovirus NOT DETECTED NOT DETECTED Final   Coronavirus 229E NOT DETECTED NOT DETECTED Final    Comment: (NOTE) The Coronavirus on the Respiratory Panel, DOES NOT test for the novel  Coronavirus (2019 nCoV)    Coronavirus HKU1 NOT DETECTED NOT DETECTED Final   Coronavirus NL63 NOT DETECTED NOT DETECTED Final   Coronavirus OC43 NOT DETECTED NOT DETECTED Final   Metapneumovirus NOT DETECTED NOT DETECTED Final   Rhinovirus  / Enterovirus NOT DETECTED NOT DETECTED Final   Influenza A NOT DETECTED NOT DETECTED Final   Influenza B NOT DETECTED NOT DETECTED Final   Parainfluenza Virus 1 NOT DETECTED NOT DETECTED Final   Parainfluenza Virus 2 NOT DETECTED NOT DETECTED Final   Parainfluenza Virus 3 NOT DETECTED NOT DETECTED Final   Parainfluenza Virus 4 NOT DETECTED NOT DETECTED Final   Respiratory Syncytial Virus NOT DETECTED NOT DETECTED Final   Bordetella pertussis NOT DETECTED NOT DETECTED Final   Bordetella Parapertussis NOT DETECTED NOT DETECTED Final   Chlamydophila pneumoniae NOT DETECTED NOT DETECTED Final   Mycoplasma pneumoniae NOT DETECTED NOT DETECTED Final  Comment: Performed at [facility], [address] [place], [place] [address]     Scheduled Meds:  amiodarone   200 mg Oral Daily   apixaban   5 mg Oral BID   atorvastatin   40 mg Oral Daily   budesonide -glycopyrrolate -formoterol   2 puff Inhalation BID   feeding supplement  237 mL Oral BID BM   furosemide   80 mg Intravenous Once   insulin  aspart  0-5 Units Subcutaneous QHS   insulin  aspart  0-9 Units Subcutaneous TID WC   insulin  aspart  4 Units Subcutaneous TID WC   ipratropium-albuterol   3 mL Nebulization Q8H   levothyroxine   100 mcg Oral QAC breakfast   pantoprazole   40 mg Oral QAC breakfast   Continuous Infusions:  Procedures/Studies: CT CHEST WO CONTRAST Result Date: 10/10/2024 EXAM: CT CHEST WITHOUT CONTRAST 10/10/2024 04:28:35 PM TECHNIQUE: CT of the chest was performed without the administration of intravenous contrast. Multiplanar reformatted images are provided for review. Automated exposure control, iterative reconstruction, and/or weight based adjustment of the mA/kV was utilized to reduce the radiation dose to as low as reasonably achievable. COMPARISON: CT angiogram chest 07/21/2024 and chest x ray 10/18/2024. CLINICAL HISTORY: Respiratory illness, nondiagnostic xray. FINDINGS: MEDIASTINUM: The heart is moderately enlarged.  Coronary and aortic atherosclerotic calcifications are noted. The central airways are clear. LYMPH NODES: Enlarged subcarinal lymph nodes measuring up to 13 mm appear unchanged. Mildly enlarged right hilar lymph nodes are grossly unchanged, but not well delineated secondary to lack of intravenous contrast. Enlarged left hilar lymph nodes measure up to 16 mm and appear unchanged. Mildly enlarged paratracheal and perivascular lymph nodes also appear unchanged. LUNGS AND PLEURA: There is a new trace left pleural effusion. There is a new small right pleural effusion which is partially loculated. Bilateral pleural calcifications are again seen, left greater than right. Numerous calcified granulomas are again seen throughout both lungs. There is some new atelectasis in the right lower lobe. There are mild ground glass opacities throughout both lungs which appears similar to prior and are nonspecific, possibly related to mild edema or infection/inflammation. No pneumothorax. SOFT TISSUES/BONES: Surgical clips in the right axilla. Sternotomy wires are present. UPPER ABDOMEN: Limited images of the upper abdomen demonstrates no acute abnormality. IMPRESSION: 1. New trace left pleural effusion and new small partially loculated right pleural effusion. 2. Mild ground glass opacities throughout both lungs, similar to prior, nonspecific, possibly related to mild edema or infection/inflammation. 3. Enlarged subcarinal, right hilar, left hilar, paratracheal, and perivascular lymph nodes, unchanged. 4. Moderate cardiomegaly with coronary and aortic atherosclerotic calcifications. Electronically signed by: Greig Pique MD MD 10/10/2024 08:01 PM EST RP Workstation: [id number]   DG Chest Portable 1 View Result Date: 10/08/2024 EXAM: 1 VIEW(S) XRAY OF THE CHEST 10/08/2024 09:39:00 PM COMPARISON: 09/11/2024 CLINICAL HISTORY: shortness of breath FINDINGS: LINES, TUBES AND DEVICES: Loop recorder device noted. LUNGS AND PLEURA: There is  mild central pulmonary vascular congestion/interstitial edema. There is some increasing hazy opacities in the right lower lobe, which may represent atelectasis or airspace disease. Stable right pleural effusion. No pneumothorax. HEART AND MEDIASTINUM: Stable cardiomegaly. Coronary artery stent in place. Cardiac valve prostheses in place. BONES AND SOFT TISSUES: Right chest wall surgical clips noted. Nondisplaced median sternotomy wires. No acute osseous abnormality. IMPRESSION: 1. Increasing hazy right lower lobe opacities, which may represent atelectasis or airspace disease. 2. Mild central pulmonary vascular congestion with interstitial edema. 3. Stable right pleural effusion. Electronically signed by: Greig Pique MD 10/08/2024 09:45 PM EST RP Workstation: [id number]  CUP PACEART REMOTE DEVICE CHECK Result Date: 09/16/2024 ILR summary report received. Battery status OK. Normal device function. No new symptom, tachy, brady, or pause episodes. No new AF episodes. Monthly summary reports and ROV/PRN LA, CVRS   Azizi Bally, DO  [facility]  If 7PM-7AM, please contact night-coverage [email or web address] Password [email or web address] 10/12/2024, 2:42 PM   LOS: 4 days   "

## 2024-10-12 NOTE — Progress Notes (Signed)
" °   10/12/24 1600  ReDS Vest / Clip  Station Marker A  Ruler Value 28 (low quality)  ReDS Actual Value  (low quality)  Anatomical Comments low quality x 3    "

## 2024-10-12 NOTE — Progress Notes (Signed)
 Unable to stop coughing but getting up some thick white phlegm.  Received robitussin and has helped cough some  Husband has been at bedside.

## 2024-10-12 NOTE — Plan of Care (Signed)

## 2024-10-13 DIAGNOSIS — I480 Paroxysmal atrial fibrillation: Secondary | ICD-10-CM | POA: Diagnosis not present

## 2024-10-13 DIAGNOSIS — J9601 Acute respiratory failure with hypoxia: Secondary | ICD-10-CM | POA: Diagnosis not present

## 2024-10-13 DIAGNOSIS — I5033 Acute on chronic diastolic (congestive) heart failure: Secondary | ICD-10-CM | POA: Diagnosis not present

## 2024-10-13 DIAGNOSIS — N1832 Chronic kidney disease, stage 3b: Secondary | ICD-10-CM | POA: Diagnosis not present

## 2024-10-13 LAB — CBC
HCT: 33.2 % — ABNORMAL LOW (ref 36.0–46.0)
Hemoglobin: 10.4 g/dL — ABNORMAL LOW (ref 12.0–15.0)
MCH: 26.8 pg (ref 26.0–34.0)
MCHC: 31.3 g/dL (ref 30.0–36.0)
MCV: 85.6 fL (ref 80.0–100.0)
Platelets: 128 K/uL — ABNORMAL LOW (ref 150–400)
RBC: 3.88 MIL/uL (ref 3.87–5.11)
RDW: 16.5 % — ABNORMAL HIGH (ref 11.5–15.5)
WBC: 8.3 K/uL (ref 4.0–10.5)
nRBC: 0 % (ref 0.0–0.2)

## 2024-10-13 LAB — BASIC METABOLIC PANEL WITH GFR
Anion gap: 8 (ref 5–15)
BUN: 29 mg/dL — ABNORMAL HIGH (ref 8–23)
CO2: 33 mmol/L — ABNORMAL HIGH (ref 22–32)
Calcium: 8.5 mg/dL — ABNORMAL LOW (ref 8.9–10.3)
Chloride: 92 mmol/L — ABNORMAL LOW (ref 98–111)
Creatinine, Ser: 1.32 mg/dL — ABNORMAL HIGH (ref 0.44–1.00)
GFR, Estimated: 41 mL/min — ABNORMAL LOW
Glucose, Bld: 132 mg/dL — ABNORMAL HIGH (ref 70–99)
Potassium: 3.8 mmol/L (ref 3.5–5.1)
Sodium: 132 mmol/L — ABNORMAL LOW (ref 135–145)

## 2024-10-13 LAB — GLUCOSE, CAPILLARY
Glucose-Capillary: 129 mg/dL — ABNORMAL HIGH (ref 70–99)
Glucose-Capillary: 146 mg/dL — ABNORMAL HIGH (ref 70–99)
Glucose-Capillary: 228 mg/dL — ABNORMAL HIGH (ref 70–99)
Glucose-Capillary: 95 mg/dL (ref 70–99)

## 2024-10-13 MED ORDER — FUROSEMIDE 10 MG/ML IJ SOLN
80.0000 mg | Freq: Once | INTRAMUSCULAR | Status: AC
  Administered 2024-10-13: 80 mg via INTRAVENOUS
  Filled 2024-10-13: qty 8

## 2024-10-13 MED ORDER — CEFADROXIL 500 MG PO CAPS
500.0000 mg | ORAL_CAPSULE | Freq: Two times a day (BID) | ORAL | Status: AC
  Administered 2024-10-13 – 2024-10-17 (×10): 500 mg via ORAL
  Filled 2024-10-13 (×11): qty 1

## 2024-10-13 NOTE — Progress Notes (Addendum)
 "          PROGRESS NOTE  [name] FMW:[id number] DOB: 18-Jun-1946 DOA: 10/08/2024 PCP: Sheryle Carwin, MD  Brief History:  79 y.o. female with medical history significant for paroxysmal A-fib on Eliquis , chronic HFpEF, prior CVA, carotid artery stenosis, valvular heart disease, CKD 3B, hypertension, hyperlipidemia, type 2 diabetes, hypothyroidism, COPD, bioprosthetic aortic and mitral valve 07/18/2013 presenting with shortness of breath that began on the evening of 10/09/2023.  She has noted some lower extremity edema.  She denies any chest pain, fever, chills, nausea, vomiting, diarrhea.  She has a nonproductive cough. In the ED, the patient was afebrile and hemodynamically stable with oxygen  saturation 87% room air.  She was placed on 2 L with saturation up to 96%. WBC 5.7, hemoglobin 10.7, platelets 143.  Sodium 133, potassium 3.8, bicarbonate 30, serum creatinine 1.55.  Chest x-ray showed hazy right lower lobe opacity.  There was mild vascular congestion.  The patient was started on IV furosemide .   Notably, the patient had a hospital admission from 09/11/2024 to 09/17/2024 during which time she was treated for COPD exacerbation and acute on chronic HFpEF.  She was discharged home with torsemide  20 mg daily.  IV furosemide  doses were gradually escalated as the patient remains fluid overloaded.  The patient gradually improved clinically.  However she remains deconditioned.   Assessment/Plan:   Acute respiratory failure with hypoxia - Multifactorial including COPD exacerbation and CHF - Stable on 2 L nasal cannula - Wean oxygen  to room air for saturation greater 92% - 1/8 CT chest--new trace L-pleural eff and small loculated R-effusion; bilateral GGO   Acute on chronic HFpEF - 07/22/2024 echo EF 60 to 65%, no WMA, normal RVF, status post bioprosthetic AVR and MVR - Continue IV furosemide --given spot dosing each day - 1/9--give lasix  60 mg IV x 1 - 1/10--give lasix  80 mg IV - 1/11--lasix   80 mg IV  - Daily weights--NEG 2 lb - Accurate I's and O's--incomplete   Hyponatremia -in part due to hyperglycemia -serum osm 279 -urine osm 194 -urine Na 38  Pyuria -send urine culture -start cefadroxil  empirically   CKD stage IIIb - Baseline creatinine 1.1-1.4 - Monitor with diuresis   Paroxysmal atrial fibrillation - Continue apixaban  - continue amiodarone  - currently in sinus   Elevated troponin - Troponin trend is flat, not consistent with ACS - Troponin 42>>41   Mixed hyperlipidemia - Continue statin   Diabetes mellitus type 2 - NovoLog  sliding scale - 09/12/2024 hemoglobin A1c 6.3 -  elevated CBGs secondary to steroids - novolog  sliding scale - add novolog  4 units with meals   Elevated D-dimer -very low clinical suspicion of PE -pt compliant with apixaban    Social -spouse has decided he cannot take care of pt at home, ok with pt going to SNF -request repeat PT eval             Family Communication:   spouse at bedside 1/11   Consultants:  none   Code Status:  DNR   DVT Prophylaxis: apixaban      Procedures: As Listed in Progress Note Above   Antibiotics: None        Subjective: Pt states sob is slowly improving.  Denies f/c, cp, n/v/d, abd pain  Objective: Vitals:   10/13/24 0419 10/13/24 0843 10/13/24 1209 10/13/24 1433  BP: 139/72  (!) 141/85   Pulse: 80  80   Resp: 19  16   Temp: 97.9 F (36.6 C)  97.8 F (  36.6 C)   TempSrc: Oral     SpO2: 99% 97% 97% 96%  Weight: 68.7 kg     Height:        Intake/Output Summary (Last 24 hours) at 10/13/2024 1449 Last data filed at 10/13/2024 1423 Gross per 24 hour  Intake 1020 ml  Output 2700 ml  Net -1680 ml   Weight change: -0.3 kg Exam:  General:  Pt is alert, follows commands appropriately, not in acute distress HEENT: No icterus, No thrush, No neck mass, [place]/AT Cardiovascular: RRR, S1/S2, no rubs, no gallops Respiratory: bibasilar crackles. No wheeze Abdomen: Soft/+BS,  non tender, non distended, no guarding Extremities: 1 + LE edema, No lymphangitis, No petechiae, No rashes, no synovitis   Data Reviewed: I have personally reviewed following labs and imaging studies Basic Metabolic Panel: Recent Labs  Lab 10/09/24 0021 10/09/24 0427 10/10/24 0431 10/11/24 0438 10/12/24 0546 10/13/24 0545  NA  --  136 131* 129* 128* 132*  K  --  3.1* 4.5 4.8 4.4 3.8  CL  --  94* 93* 88* 88* 92*  CO2  --  33* 22 20* 31 33*  GLUCOSE  --  97 207* 253* 129* 132*  BUN  --  15 18 23  28* 29*  CREATININE  --  1.48* 1.53* 1.38* 1.37* 1.32*  CALCIUM   --  8.0* 8.4* 8.6* 8.8* 8.5*  MG 1.3*  --  1.9 2.1 2.4  --   PHOS 2.5  --   --   --   --   --    Liver Function Tests: No results for input(s): AST, ALT, ALKPHOS, BILITOT, PROT, ALBUMIN  in the last 168 hours. No results for input(s): LIPASE, AMYLASE in the last 168 hours. No results for input(s): AMMONIA in the last 168 hours. Coagulation Profile: No results for input(s): INR, PROTIME in the last 168 hours. CBC: Recent Labs  Lab 10/08/24 2147 10/09/24 0427 10/13/24 0545  WBC 5.7 5.4 8.3  HGB 10.7* 10.6* 10.4*  HCT 33.9* 34.2* 33.2*  MCV 85.6 84.9 85.6  PLT 143* 138* 128*   Cardiac Enzymes: No results for input(s): CKTOTAL, CKMB, CKMBINDEX, TROPONINI in the last 168 hours. BNP: Invalid input(s): POCBNP CBG: Recent Labs  Lab 10/12/24 1206 10/12/24 1637 10/12/24 2005 10/13/24 0737 10/13/24 1207  GLUCAP 240* 190* 107* 129* 146*   HbA1C: No results for input(s): HGBA1C in the last 72 hours. Urine analysis:    Component Value Date/Time   COLORURINE STRAW (A) 10/12/2024 1401   APPEARANCEUR CLEAR 10/12/2024 1401   LABSPEC 1.004 (L) 10/12/2024 1401   PHURINE 7.0 10/12/2024 1401   GLUCOSEU NEGATIVE 10/12/2024 1401   HGBUR SMALL (A) 10/12/2024 1401   BILIRUBINUR NEGATIVE 10/12/2024 1401   KETONESUR NEGATIVE 10/12/2024 1401   PROTEINUR NEGATIVE 10/12/2024 1401    UROBILINOGEN 0.2 07/07/2014 1935   NITRITE NEGATIVE 10/12/2024 1401   LEUKOCYTESUR LARGE (A) 10/12/2024 1401   Sepsis Labs: @LABRCNTIP (procalcitonin:4,lacticidven:4) ) Recent Results (from the past 240 hours)  Resp panel by RT-PCR (RSV, Flu A&B, Covid) Anterior Nasal Swab     Status: None   Collection Time: 10/09/24 12:05 PM   Specimen: Anterior Nasal Swab  Result Value Ref Range Status   SARS Coronavirus 2 by RT PCR NEGATIVE NEGATIVE Final    Comment: (NOTE) SARS-CoV-2 target nucleic acids are NOT DETECTED.  The SARS-CoV-2 RNA is generally detectable in upper respiratory specimens during the acute phase of infection. The lowest concentration of SARS-CoV-2 viral copies this assay can detect is 138  copies/mL. A negative result does not preclude SARS-Cov-2 infection and should not be used as the sole basis for treatment or other patient management decisions. A negative result may occur with  improper specimen collection/handling, submission of specimen other than nasopharyngeal swab, presence of viral mutation(s) within the areas targeted by this assay, and inadequate number of viral copies(<138 copies/mL). A negative result must be combined with clinical observations, patient history, and epidemiological information. The expected result is Negative.  Fact Sheet for Patients:  bloggercourse.com  Fact Sheet for Healthcare Providers:  seriousbroker.it  This test is no t yet approved or cleared by the [place]  FDA and  has been authorized for detection and/or diagnosis of SARS-CoV-2 by FDA under an Emergency Use Authorization (EUA). This EUA will remain  in effect (meaning this test can be used) for the duration of the COVID-19 declaration under Section 564(b)(1) of the Act, 21 U.S.C.section 360bbb-3(b)(1), unless the authorization is terminated  or revoked sooner.       Influenza A by PCR NEGATIVE NEGATIVE Final    Influenza B by PCR NEGATIVE NEGATIVE Final    Comment: (NOTE) The Xpert Xpress SARS-CoV-2/FLU/RSV plus assay is intended as an aid in the diagnosis of influenza from Nasopharyngeal swab specimens and should not be used as a sole basis for treatment. Nasal washings and aspirates are unacceptable for Xpert Xpress SARS-CoV-2/FLU/RSV testing.  Fact Sheet for Patients: bloggercourse.com  Fact Sheet for Healthcare Providers: seriousbroker.it  This test is not yet approved or cleared by the [place]  FDA and has been authorized for detection and/or diagnosis of SARS-CoV-2 by FDA under an Emergency Use Authorization (EUA). This EUA will remain in effect (meaning this test can be used) for the duration of the COVID-19 declaration under Section 564(b)(1) of the Act, 21 U.S.C. section 360bbb-3(b)(1), unless the authorization is terminated or revoked.     Resp Syncytial Virus by PCR NEGATIVE NEGATIVE Final    Comment: (NOTE) Fact Sheet for Patients: bloggercourse.com  Fact Sheet for Healthcare Providers: seriousbroker.it  This test is not yet approved or cleared by the [place]  FDA and has been authorized for detection and/or diagnosis of SARS-CoV-2 by FDA under an Emergency Use Authorization (EUA). This EUA will remain in effect (meaning this test can be used) for the duration of the COVID-19 declaration under Section 564(b)(1) of the Act, 21 U.S.C. section 360bbb-3(b)(1), unless the authorization is terminated or revoked.  Performed at [facility], [address], [place], [place] [address]   Respiratory (~20 pathogens) panel by PCR     Status: None   Collection Time: 10/09/24  1:15 PM   Specimen: Nasopharyngeal Swab; Respiratory  Result Value Ref Range Status   Adenovirus NOT DETECTED NOT DETECTED Final   Coronavirus 229E NOT DETECTED NOT DETECTED Final    Comment:  (NOTE) The Coronavirus on the Respiratory Panel, DOES NOT test for the novel  Coronavirus (2019 nCoV)    Coronavirus HKU1 NOT DETECTED NOT DETECTED Final   Coronavirus NL63 NOT DETECTED NOT DETECTED Final   Coronavirus OC43 NOT DETECTED NOT DETECTED Final   Metapneumovirus NOT DETECTED NOT DETECTED Final   Rhinovirus / Enterovirus NOT DETECTED NOT DETECTED Final   Influenza A NOT DETECTED NOT DETECTED Final   Influenza B NOT DETECTED NOT DETECTED Final   Parainfluenza Virus 1 NOT DETECTED NOT DETECTED Final   Parainfluenza Virus 2 NOT DETECTED NOT DETECTED Final   Parainfluenza Virus 3 NOT DETECTED NOT DETECTED Final   Parainfluenza Virus 4 NOT DETECTED  NOT DETECTED Final   Respiratory Syncytial Virus NOT DETECTED NOT DETECTED Final   Bordetella pertussis NOT DETECTED NOT DETECTED Final   Bordetella Parapertussis NOT DETECTED NOT DETECTED Final   Chlamydophila pneumoniae NOT DETECTED NOT DETECTED Final   Mycoplasma pneumoniae NOT DETECTED NOT DETECTED Final    Comment: Performed at [facility], [address] [place], [place] [address]     Scheduled Meds:  amiodarone   200 mg Oral Daily   apixaban   5 mg Oral BID   atorvastatin   40 mg Oral Daily   budesonide -glycopyrrolate -formoterol   2 puff Inhalation BID   cefadroxil   500 mg Oral BID   feeding supplement  237 mL Oral BID BM   insulin  aspart  0-5 Units Subcutaneous QHS   insulin  aspart  0-9 Units Subcutaneous TID WC   insulin  aspart  4 Units Subcutaneous TID WC   ipratropium-albuterol   3 mL Nebulization TID   levothyroxine   100 mcg Oral QAC breakfast   pantoprazole   40 mg Oral QAC breakfast   Continuous Infusions:  Procedures/Studies: CT CHEST WO CONTRAST Result Date: 10/10/2024 EXAM: CT CHEST WITHOUT CONTRAST 10/10/2024 04:28:35 PM TECHNIQUE: CT of the chest was performed without the administration of intravenous contrast. Multiplanar reformatted images are provided for review. Automated exposure control, iterative  reconstruction, and/or weight based adjustment of the mA/kV was utilized to reduce the radiation dose to as low as reasonably achievable. COMPARISON: CT angiogram chest 07/21/2024 and chest x ray 10/18/2024. CLINICAL HISTORY: Respiratory illness, nondiagnostic xray. FINDINGS: MEDIASTINUM: The heart is moderately enlarged. Coronary and aortic atherosclerotic calcifications are noted. The central airways are clear. LYMPH NODES: Enlarged subcarinal lymph nodes measuring up to 13 mm appear unchanged. Mildly enlarged right hilar lymph nodes are grossly unchanged, but not well delineated secondary to lack of intravenous contrast. Enlarged left hilar lymph nodes measure up to 16 mm and appear unchanged. Mildly enlarged paratracheal and perivascular lymph nodes also appear unchanged. LUNGS AND PLEURA: There is a new trace left pleural effusion. There is a new small right pleural effusion which is partially loculated. Bilateral pleural calcifications are again seen, left greater than right. Numerous calcified granulomas are again seen throughout both lungs. There is some new atelectasis in the right lower lobe. There are mild ground glass opacities throughout both lungs which appears similar to prior and are nonspecific, possibly related to mild edema or infection/inflammation. No pneumothorax. SOFT TISSUES/BONES: Surgical clips in the right axilla. Sternotomy wires are present. UPPER ABDOMEN: Limited images of the upper abdomen demonstrates no acute abnormality. IMPRESSION: 1. New trace left pleural effusion and new small partially loculated right pleural effusion. 2. Mild ground glass opacities throughout both lungs, similar to prior, nonspecific, possibly related to mild edema or infection/inflammation. 3. Enlarged subcarinal, right hilar, left hilar, paratracheal, and perivascular lymph nodes, unchanged. 4. Moderate cardiomegaly with coronary and aortic atherosclerotic calcifications. Electronically signed by: Greig Pique MD MD 10/10/2024 08:01 PM EST RP Workstation: [id number]   DG Chest Portable 1 View Result Date: 10/08/2024 EXAM: 1 VIEW(S) XRAY OF THE CHEST 10/08/2024 09:39:00 PM COMPARISON: 09/11/2024 CLINICAL HISTORY: shortness of breath FINDINGS: LINES, TUBES AND DEVICES: Loop recorder device noted. LUNGS AND PLEURA: There is mild central pulmonary vascular congestion/interstitial edema. There is some increasing hazy opacities in the right lower lobe, which may represent atelectasis or airspace disease. Stable right pleural effusion. No pneumothorax. HEART AND MEDIASTINUM: Stable cardiomegaly. Coronary artery stent in place. Cardiac valve prostheses in place. BONES AND SOFT TISSUES: Right chest wall surgical  clips noted. Nondisplaced median sternotomy wires. No acute osseous abnormality. IMPRESSION: 1. Increasing hazy right lower lobe opacities, which may represent atelectasis or airspace disease. 2. Mild central pulmonary vascular congestion with interstitial edema. 3. Stable right pleural effusion. Electronically signed by: Greig Pique MD 10/08/2024 09:45 PM EST RP Workstation: [id number]   CUP PACEART REMOTE DEVICE CHECK Result Date: 09/16/2024 ILR summary report received. Battery status OK. Normal device function. No new symptom, tachy, brady, or pause episodes. No new AF episodes. Monthly summary reports and ROV/PRN LA, CVRS   Ekaterini Capitano, DO  [facility]  If 7PM-7AM, please contact night-coverage [email or web address] Password [email or web address] 10/13/2024, 2:49 PM   LOS: 5 days   "

## 2024-10-13 NOTE — Plan of Care (Signed)

## 2024-10-14 ENCOUNTER — Telehealth: Payer: Self-pay

## 2024-10-14 ENCOUNTER — Encounter

## 2024-10-14 DIAGNOSIS — J9601 Acute respiratory failure with hypoxia: Secondary | ICD-10-CM | POA: Diagnosis not present

## 2024-10-14 DIAGNOSIS — I480 Paroxysmal atrial fibrillation: Secondary | ICD-10-CM | POA: Diagnosis not present

## 2024-10-14 DIAGNOSIS — I5033 Acute on chronic diastolic (congestive) heart failure: Secondary | ICD-10-CM | POA: Diagnosis not present

## 2024-10-14 LAB — BASIC METABOLIC PANEL WITH GFR
Anion gap: 9 (ref 5–15)
BUN: 24 mg/dL — ABNORMAL HIGH (ref 8–23)
CO2: 33 mmol/L — ABNORMAL HIGH (ref 22–32)
Calcium: 8.5 mg/dL — ABNORMAL LOW (ref 8.9–10.3)
Chloride: 92 mmol/L — ABNORMAL LOW (ref 98–111)
Creatinine, Ser: 1.09 mg/dL — ABNORMAL HIGH (ref 0.44–1.00)
GFR, Estimated: 51 mL/min — ABNORMAL LOW
Glucose, Bld: 125 mg/dL — ABNORMAL HIGH (ref 70–99)
Potassium: 3.2 mmol/L — ABNORMAL LOW (ref 3.5–5.1)
Sodium: 134 mmol/L — ABNORMAL LOW (ref 135–145)

## 2024-10-14 LAB — GLUCOSE, CAPILLARY
Glucose-Capillary: 127 mg/dL — ABNORMAL HIGH (ref 70–99)
Glucose-Capillary: 228 mg/dL — ABNORMAL HIGH (ref 70–99)
Glucose-Capillary: 187 mg/dL — ABNORMAL HIGH (ref 70–99)
Glucose-Capillary: 215 mg/dL — ABNORMAL HIGH (ref 70–99)

## 2024-10-14 LAB — MAGNESIUM: Magnesium: 2 mg/dL (ref 1.7–2.4)

## 2024-10-14 MED ORDER — FUROSEMIDE 10 MG/ML IJ SOLN: 40.0000 mg | Freq: Every day | INTRAMUSCULAR | Status: DC

## 2024-10-14 MED ORDER — POTASSIUM CHLORIDE CRYS ER 20 MEQ PO TBCR
40.0000 meq | EXTENDED_RELEASE_TABLET | Freq: Once | ORAL | Status: AC
  Administered 2024-10-14: 40 meq via ORAL
  Filled 2024-10-14: qty 4

## 2024-10-14 MED ORDER — HYDROCODONE BIT-HOMATROP MBR 5-1.5 MG/5ML PO SOLN
5.0000 mL | Freq: Every day | ORAL | Status: DC
  Administered 2024-10-14 – 2024-10-23 (×10): 5 mL via ORAL
  Filled 2024-10-14 (×12): qty 5

## 2024-10-14 MED ORDER — FUROSEMIDE 10 MG/ML IJ SOLN
80.0000 mg | Freq: Every day | INTRAMUSCULAR | Status: DC
  Administered 2024-10-14 – 2024-10-15 (×2): 80 mg via INTRAVENOUS
  Filled 2024-10-14 (×2): qty 8

## 2024-10-14 NOTE — TOC Progression Note (Signed)
 Transition of Care [facility]) - Progression Note    Patient Details  Name: [name] MRN: [id number] Date of Birth: 18-Jul-1946  Transition of [facility]) CM/SW Contact  [place] Saddie Kim, [place] Phone Number: 10/14/2024, 11:00 AM  Clinical Narrative: PT now recommending SNF. LCSW discussed placement process with pt's husband, including insurance authorization and Medicare.gov ratings. Requests [place] or [place] placement. Will initiate bed search and SNF auth.         Expected Discharge Plan: Home w Home Health Services Barriers to Discharge: Continued Medical Work up               Expected Discharge Plan and Services In-house Referral: Clinical Social Work   Post Acute Care Choice: Resumption of Svcs/PTA Provider Living arrangements for the past 2 months: Single Family Home                           HH Arranged: PT HH Agency: [facility] Date [facility] Agency Contacted: 10/09/24 Time HH Agency Contacted: 1138 Representative spoke with at [facility] Agency: Darle[place]   Social Drivers of Health (SDOH) Interventions SDOH Screenings   Food Insecurity: No Food Insecurity (10/09/2024)  Housing: Low Risk (10/09/2024)  Transportation Needs: No Transportation Needs (10/09/2024)  Utilities: Not At Risk (10/09/2024)  Social Connections: Moderately Isolated (10/09/2024)  Tobacco Use: Low Risk (10/08/2024)    Readmission Risk Interventions    10/09/2024   11:16 AM 09/17/2024   11:14 AM 09/16/2024   11:53 AM  Readmission Risk Prevention Plan  Transportation Screening Complete Complete Complete  Medication Review Oceanographer) Complete Complete Complete  PCP or Specialist appointment within 3-5 days of discharge  Complete   HRI or Home Care Consult Complete Complete Complete  SW Recovery Care/Counseling Consult Complete Complete Complete  Palliative Care Screening Not Applicable Not Applicable Not Applicable  Skilled Nursing Facility Not Applicable Not Applicable Not  Applicable

## 2024-10-14 NOTE — Progress Notes (Signed)
 Physical Therapy Treatment Patient Details Name: [name] MRN: [id number] DOB: 29-Sep-1946 Today's Date: 10/14/2024   History of Present Illness PENNEY DOMANSKI is a 79 y.o. female with medical history significant for paroxysmal A-fib on Eliquis , chronic HFpEF, prior CVA, carotid artery stenosis, valvular heart disease, CKD 3B, hypertension, hyperlipidemia, type 2 diabetes, hypothyroidism, COPD, presents to the ER with complaints of sudden onset shortness of breath, around 8 PM tonight.  Associated with bilateral lower extremity edema.  No reported anginal symptoms.  The patient was brought into the ER for further evaluation.    PT Comments  Patient presents seated in chair (assisted by nursing staff) and agreeable for therapy. Patient tolerated completing exercises with requiring rest breaks due to fatigue, ambulate to bathroom with fair/good return for transferring to/from commode using RW and limited for ambulation due to c/o fatigue and weakness. Patient tolerated staying up in chair after therapy with her spouse present. Patient will benefit from continued skilled physical therapy in hospital and recommended venue below to increase strength, balance, endurance for safe ADLs and gait.      If plan is discharge home, recommend the following: A little help with walking and/or transfers;A little help with bathing/dressing/bathroom;Help with stairs or ramp for entrance;Assist for transportation;Assistance with cooking/housework   Can travel by private vehicle     Yes  Equipment Recommendations  None recommended by PT    Recommendations for Other Services       Precautions / Restrictions Precautions Precautions: Fall Recall of Precautions/Restrictions: Intact Restrictions Weight Bearing Restrictions Per Provider Order: No     Mobility  Bed Mobility               General bed mobility comments: Patient present seated in chair (assisted by nursing staff)     Transfers Overall transfer level: Needs assistance Equipment used: Rolling walker (2 wheels) Transfers: Sit to/from Stand, Bed to chair/wheelchair/BSC Sit to Stand: Min assist   Step pivot transfers: Min assist       General transfer comment: unsteady labored movement    Ambulation/Gait Ambulation/Gait assistance: Min assist Gait Distance (Feet): 20 Feet Assistive device: Rolling walker (2 wheels) Gait Pattern/deviations: Decreased step length - right, Decreased step length - left, Decreased stride length Gait velocity: dec     General Gait Details: slow labored unsteady movement with increased time for making turns, limited mostly due to fatigue and mild SOB while on 3 LPM O2   Stairs             Wheelchair Mobility     Tilt Bed    Modified Rankin (Stroke Patients Only)       Balance Overall balance assessment: Needs assistance Sitting-balance support: Feet supported, No upper extremity supported Sitting balance-Leahy Scale: Fair Sitting balance - Comments: fair seated edge of chair   Standing balance support: Reliant on assistive device for balance, During functional activity, Bilateral upper extremity supported Standing balance-Leahy Scale: Poor Standing balance comment: fair/poor using RW                            Communication Communication Communication: No apparent difficulties  Cognition Arousal: Alert Behavior During Therapy: WFL for tasks assessed/performed   PT - Cognitive impairments: No apparent impairments                         Following commands: Intact      Cueing Cueing Techniques: Verbal  cues, Tactile cues  Exercises General Exercises - Lower Extremity Long Arc Quad: Seated, AAROM, Strengthening, Both, 10 reps Hip Flexion/Marching: Seated, Strengthening, Both, 10 reps, AROM Toe Raises: Seated, AROM, Strengthening, Both, 10 reps Heel Raises: Seated, Strengthening, AROM, Both, 10 reps    General  Comments        Pertinent Vitals/Pain Pain Assessment Pain Assessment: No/denies pain    Home Living                          Prior Function            PT Goals (current goals can now be found in the care plan section) Acute Rehab PT Goals Patient Stated Goal: return home after rehab PT Goal Formulation: With patient/family Time For Goal Achievement: 10/23/24 Potential to Achieve Goals: Good Progress towards PT goals: Progressing toward goals    Frequency    Min 3X/week      PT Plan      Co-evaluation              AM-PAC PT 6 Clicks Mobility   Outcome Measure  Help needed turning from your back to your side while in a flat bed without using bedrails?: A Little Help needed moving from lying on your back to sitting on the side of a flat bed without using bedrails?: A Little Help needed moving to and from a bed to a chair (including a wheelchair)?: A Little Help needed standing up from a chair using your arms (e.g., wheelchair or bedside chair)?: A Little Help needed to walk in hospital room?: A Lot Help needed climbing 3-5 steps with a railing? : A Lot 6 Click Score: 16    End of Session Equipment Utilized During Treatment: Oxygen  Activity Tolerance: Patient tolerated treatment well;Patient limited by fatigue Patient left: in chair;with call bell/phone within reach;with family/visitor present Nurse Communication: Mobility status PT Visit Diagnosis: Unsteadiness on feet (R26.81);Other abnormalities of gait and mobility (R26.89);Muscle weakness (generalized) (M62.81)     Time: 9056-8981 PT Time Calculation (min) (ACUTE ONLY): 35 min  Charges:    $Therapeutic Exercise: 8-22 mins $Therapeutic Activity: 8-22 mins PT General Charges $$ ACUTE PT VISIT: 1 Visit                     4:06 PM, 10/14/2024 Lynwood Music, MPT Physical Therapist with [facility] [phone] office [phone] mobile phone

## 2024-10-14 NOTE — NC FL2 (Signed)
 " [place]  MEDICAID FL2 LEVEL OF CARE FORM     IDENTIFICATION  Patient Name: [name] Birthdate: 08-Jan-1946 Sex: female Admission Date (Current Location): 10/08/2024  [facility] and [place] Number:  [employer] and Address:  [facility],  [address] [place] [address]      Provider Number: [id number]  Attending Physician Name and Address:  Evonnie Lenis, MD  Relative Name and Phone Number:       Current Level of Care: Hospital Recommended Level of Care: Skilled Nursing Facility Prior Approval Number:    Date Approved/Denied:   PASRR Number: [id number]  Discharge Plan: SNF    Current Diagnoses: Patient Active Problem List   Diagnosis Date Noted   PAF (paroxysmal atrial fibrillation) (HCC) 10/10/2024   Acute on chronic diastolic (congestive) heart failure (HCC) 10/08/2024   Elevated troponin 09/15/2024   Generalized weakness 07/22/2024   Elevated d-dimer 07/22/2024   Chronic kidney disease, stage 3b (HCC) 07/22/2024   Acute on chronic heart failure with preserved ejection fraction (HFpEF, >= 50%) (HCC) 07/22/2024   Mood disorder 07/22/2024   Fracture of orbital floor, closed (HCC) 06/29/2024   Injury of face 06/29/2024   Lip laceration 06/29/2024   Chin laceration 06/29/2024   Intraparenchymal hematoma of brain (HCC) 06/28/2024   Heart failure with recovered ejection fraction (HFrecEF) (HCC) 06/28/2024   Atypical atrial flutter (HCC) 01/04/2023   Syncope 11/11/2022   Acute respiratory failure with hypoxia (HCC) 03/31/2022   Acute congestive heart failure (HCC) 03/31/2022   CAP (community acquired pneumonia) 03/31/2022   RSV infection 06/03/2021   COPD with acute exacerbation (HCC) 06/01/2021   Type 2 diabetes mellitus treated without insulin  (HCC) 06/01/2021   COPD exacerbation (HCC) 06/01/2021   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   Asthma exacerbation 11/12/2016   Synovial cyst of lumbar spine 05/13/2014   Encounter for  therapeutic drug monitoring 11/06/2013   Hyponatremia    Atrial fibrillation (HCC)    Moderate aortic stenosis 07/12/2013   Moderate aortic insufficiency 07/12/2013   S/P MVR (mitral valve replacement) 07/12/2013   Severe mitral valve stenosis 07/09/2013   Mitral stenosis 07/08/2013   Chest pain 07/07/2013   Asthma, cough variant 03/27/2013   Upper airway cough syndrome in pt with mild cough variant asthma  09/17/2012   Dyspnea on exertion 06/13/2012   Edema 06/13/2012   Rheumatic heart disease 06/13/2012   Fasting hyperglycemia    Chronic anticoagulation 01/26/2012   Acquired hypothyroidism 01/23/2012   Cardioembolic stroke (HCC) 01/02/2012   Mixed hyperlipidemia    Essential hypertension    Breast carcinoma (HCC)    Gastroesophageal reflux disease     Orientation RESPIRATION BLADDER Height & Weight     Self, Place  O2 (2L) External catheter Weight: 151 lb 0.2 oz (68.5 kg) Height:  5' 2 (157.5 cm)  BEHAVIORAL SYMPTOMS/MOOD NEUROLOGICAL BOWEL NUTRITION STATUS      Incontinent Diet (See d/c summary)  AMBULATORY STATUS COMMUNICATION OF NEEDS Skin   Extensive Assist Verbally Bruising, Other (Comment) (Redness to bilateral buttocks)                       Personal Care Assistance Level of Assistance  Bathing, Feeding, Dressing Bathing Assistance: Maximum assistance Feeding assistance: Limited assistance Dressing Assistance: Maximum assistance     Functional Limitations Info  Sight, Hearing, Speech   Hearing Info: Adequate Speech Info: Adequate    SPECIAL CARE FACTORS FREQUENCY  PT (By licensed PT)  PT Frequency: 5x weekly              Contractures      Additional Factors Info  Code Status, Psychotropic, Allergies Code Status Info: DNR- Limited Allergies Info: No known allergies Psychotropic Info: Lexapro          Current Medications (10/14/2024):  This is the current hospital active medication list Current Facility-Administered Medications   Medication Dose Route Frequency Provider Last Rate Last Admin   acetaminophen  (TYLENOL ) tablet 500 mg  500 mg Oral Q6H PRN Hall, Carole N, DO   500 mg at 10/09/24 1653   amiodarone  (PACERONE ) tablet 200 mg  200 mg Oral Daily Tat, David, MD   200 mg at 10/14/24 0930   apixaban  (ELIQUIS ) tablet 5 mg  5 mg Oral BID Shona Laurence N, DO   5 mg at 10/14/24 9068   atorvastatin  (LIPITOR) tablet 40 mg  40 mg Oral Daily Shona Laurence N, DO   40 mg at 10/14/24 9068   azelastine  (ASTELIN ) 0.1 % nasal spray 1 spray  1 spray Each Nare Daily PRN Shona Laurence SAILOR, DO       budesonide -glycopyrrolate -formoterol  (BREZTRI ) 160-9-4.8 MCG/ACT inhaler 2 puff  2 puff Inhalation BID Tat, Alm, MD   2 puff at 10/14/24 0815   cefadroxil  (DURICEF) capsule 500 mg  500 mg Oral BID Tat, Alm, MD   500 mg at 10/14/24 0931   feeding supplement (ENSURE PLUS HIGH PROTEIN) liquid 237 mL  237 mL Oral BID BM Hall, Carole N, DO   237 mL at 10/13/24 1000   guaiFENesin -dextromethorphan  (ROBITUSSIN DM) 100-10 MG/5ML syrup 5 mL  5 mL Oral Q4H PRN Tat, Alm, MD   5 mL at 10/14/24 0931   insulin  aspart (novoLOG ) injection 0-5 Units  0-5 Units Subcutaneous QHS Evonnie Alm, MD   3 Units at 10/11/24 2030   insulin  aspart (novoLOG ) injection 0-9 Units  0-9 Units Subcutaneous TID WC Evonnie Alm, MD   1 Units at 10/14/24 0932   ipratropium-albuterol  (DUONEB) 0.5-2.5 (3) MG/3ML nebulizer solution 3 mL  3 mL Nebulization TID Evonnie Alm, MD   3 mL at 10/14/24 0811   levothyroxine  (SYNTHROID ) tablet 100 mcg  100 mcg Oral QAC breakfast Shona Laurence SAILOR, DO   100 mcg at 10/14/24 0344   meclizine  (ANTIVERT ) tablet 25 mg  25 mg Oral Q6H PRN Shona Laurence N, DO       melatonin tablet 6 mg  6 mg Oral QHS PRN Shona Laurence N, DO       pantoprazole  (PROTONIX ) EC tablet 40 mg  40 mg Oral QAC breakfast Shona Laurence N, DO   40 mg at 10/14/24 0931   polyethylene glycol (MIRALAX  / GLYCOLAX ) packet 17 g  17 g Oral Daily PRN Shona Laurence N, DO       prochlorperazine   (COMPAZINE ) injection 5 mg  5 mg Intravenous Q6H PRN Shona Laurence SAILOR, DO         Discharge Medications: Please see discharge summary for a list of discharge medications.  Relevant Imaging Results:  Relevant Lab Results:   Additional Information SSN: [id number]  [place] Saddie Kim, LCSW     "

## 2024-10-14 NOTE — Progress Notes (Signed)
 "          PROGRESS NOTE  [name] FMW:[id number] DOB: 12/28/1945 DOA: 10/08/2024 PCP: Sheryle Carwin, MD  Brief History:  79 y.o. female with medical history significant for paroxysmal A-fib on Eliquis , chronic HFpEF, prior CVA, carotid artery stenosis, valvular heart disease, CKD 3B, hypertension, hyperlipidemia, type 2 diabetes, hypothyroidism, COPD, bioprosthetic aortic and mitral valve 07/18/2013 presenting with shortness of breath that began on the evening of 10/09/2023.  She has noted some lower extremity edema.  She denies any chest pain, fever, chills, nausea, vomiting, diarrhea.  She has a nonproductive cough. In the ED, the patient was afebrile and hemodynamically stable with oxygen  saturation 87% room air.  She was placed on 2 L with saturation up to 96%. WBC 5.7, hemoglobin 10.7, platelets 143.  Sodium 133, potassium 3.8, bicarbonate 30, serum creatinine 1.55.  Chest x-ray showed hazy right lower lobe opacity.  There was mild vascular congestion.  The patient was started on IV furosemide .   Notably, the patient had a hospital admission from 09/11/2024 to 09/17/2024 during which time she was treated for COPD exacerbation and acute on chronic HFpEF.  She was discharged home with torsemide  20 mg daily.  IV furosemide  doses were gradually escalated as the patient remains fluid overloaded.  The patient gradually improved clinically.  However she remains deconditioned.   Assessment/Plan:   Acute respiratory failure with hypoxia - Multifactorial including COPD exacerbation and CHF - Stable on 2 L nasal cannula - Wean oxygen  to room air for saturation greater 92% - 1/8 CT chest--new trace L-pleural eff and small loculated R-effusion; bilateral GGO   Acute on chronic HFpEF - 07/22/2024 echo EF 60 to 65%, no WMA, normal RVF, status post bioprosthetic AVR and MVR - Continue IV furosemide --given spot dosing each day - 1/9--give lasix  60 mg IV x 1 - 1/10--give lasix  80 mg IV - 1/11--lasix   80 mg IV  - 1/12--lasix  80 mg IV - Daily weights--NEG 2 lb - Accurate I's and O's--incomplete   Hyponatremia -in part due to hyperglycemia and fluid overload -serum osm 279 -urine osm 194 -urine Na 38 - improving with IV lasix    Pyuria -send urine culture -start cefadroxil  empirically   CKD stage IIIb - Baseline creatinine 1.1-1.4 - Monitor with diuresis   Paroxysmal atrial fibrillation - Continue apixaban  - continue amiodarone  - currently in sinus   Elevated troponin - Troponin trend is flat, not consistent with ACS - Troponin 42>>41   Mixed hyperlipidemia - Continue statin   Diabetes mellitus type 2 - NovoLog  sliding scale - 09/12/2024 hemoglobin A1c 6.3 -  elevated CBGs secondary to steroids - novolog  sliding scale - add novolog  4 units with meals   Elevated D-dimer -very low clinical suspicion of PE -pt compliant with apixaban    Social -spouse has decided he cannot take care of pt at home, ok with pt going to SNF -request repeat PT eval>>SNF             Family Communication:   spouse at bedside 1/12   Consultants:  none   Code Status:  DNR   DVT Prophylaxis: apixaban      Procedures: As Listed in Progress Note Above   Antibiotics: None         Subjective:  Pt is pleasantly confused.  Denies cp, sob, n/v/d, abd pain, f/c.  Has dry cough Objective: Vitals:   10/14/24 0419 10/14/24 0811 10/14/24 0815 10/14/24 1243  BP: 134/61   (!) 144/69  Pulse: 76  81  Resp: 19   16  Temp: (!) 97.5 F (36.4 C)   98.2 F (36.8 C)  TempSrc: Oral   Oral  SpO2: 97% 96% 98% 98%  Weight: 68.5 kg     Height:        Intake/Output Summary (Last 24 hours) at 10/14/2024 1244 Last data filed at 10/14/2024 0457 Gross per 24 hour  Intake 840 ml  Output 2300 ml  Net -1460 ml   Weight change: -0.2 kg Exam:  General:  Pt is alert, follows commands appropriately, not in acute distress HEENT: No icterus, No thrush, No neck mass,  /AT Cardiovascular: RRR, S1/S2, no rubs, no gallops Respiratory: bibasilar crackles. No wheeze Abdomen: Soft/+BS, non tender, non distended, no guarding Extremities: 1 + LE edema, No lymphangitis, No petechiae, No rashes, no synovitis   Data Reviewed: I have personally reviewed following labs and imaging studies Basic Metabolic Panel: Recent Labs  Lab 10/09/24 0021 10/09/24 0427 10/10/24 0431 10/11/24 0438 10/12/24 0546 10/13/24 0545 10/14/24 0446  NA  --    < > 131* 129* 128* 132* 134*  K  --    < > 4.5 4.8 4.4 3.8 3.2*  CL  --    < > 93* 88* 88* 92* 92*  CO2  --    < > 22 20* 31 33* 33*  GLUCOSE  --    < > 207* 253* 129* 132* 125*  BUN  --    < > 18 23 28* 29* 24*  CREATININE  --    < > 1.53* 1.38* 1.37* 1.32* 1.09*  CALCIUM   --    < > 8.4* 8.6* 8.8* 8.5* 8.5*  MG 1.3*  --  1.9 2.1 2.4  --  2.0  PHOS 2.5  --   --   --   --   --   --    < > = values in this interval not displayed.   Liver Function Tests: No results for input(s): AST, ALT, ALKPHOS, BILITOT, PROT, ALBUMIN  in the last 168 hours. No results for input(s): LIPASE, AMYLASE in the last 168 hours. No results for input(s): AMMONIA in the last 168 hours. Coagulation Profile: No results for input(s): INR, PROTIME in the last 168 hours. CBC: Recent Labs  Lab 10/08/24 2147 10/09/24 0427 10/13/24 0545  WBC 5.7 5.4 8.3  HGB 10.7* 10.6* 10.4*  HCT 33.9* 34.2* 33.2*  MCV 85.6 84.9 85.6  PLT 143* 138* 128*   Cardiac Enzymes: No results for input(s): CKTOTAL, CKMB, CKMBINDEX, TROPONINI in the last 168 hours. BNP: Invalid input(s): POCBNP CBG: Recent Labs  Lab 10/13/24 1207 10/13/24 1647 10/13/24 2003 10/14/24 0725 10/14/24 1126  GLUCAP 146* 228* 95 127* 228*   HbA1C: No results for input(s): HGBA1C in the last 72 hours. Urine analysis:    Component Value Date/Time   COLORURINE STRAW (A) 10/12/2024 1401   APPEARANCEUR CLEAR 10/12/2024 1401   LABSPEC 1.004 (L)  10/12/2024 1401   PHURINE 7.0 10/12/2024 1401   GLUCOSEU NEGATIVE 10/12/2024 1401   HGBUR SMALL (A) 10/12/2024 1401   BILIRUBINUR NEGATIVE 10/12/2024 1401   KETONESUR NEGATIVE 10/12/2024 1401   PROTEINUR NEGATIVE 10/12/2024 1401   UROBILINOGEN 0.2 07/07/2014 1935   NITRITE NEGATIVE 10/12/2024 1401   LEUKOCYTESUR LARGE (A) 10/12/2024 1401   Sepsis Labs: @LABRCNTIP (procalcitonin:4,lacticidven:4) ) Recent Results (from the past 240 hours)  Resp panel by RT-PCR (RSV, Flu A&B, Covid) Anterior Nasal Swab     Status: None   Collection Time: 10/09/24 12:05  PM   Specimen: Anterior Nasal Swab  Result Value Ref Range Status   SARS Coronavirus 2 by RT PCR NEGATIVE NEGATIVE Final    Comment: (NOTE) SARS-CoV-2 target nucleic acids are NOT DETECTED.  The SARS-CoV-2 RNA is generally detectable in upper respiratory specimens during the acute phase of infection. The lowest concentration of SARS-CoV-2 viral copies this assay can detect is 138 copies/mL. A negative result does not preclude SARS-Cov-2 infection and should not be used as the sole basis for treatment or other patient management decisions. A negative result may occur with  improper specimen collection/handling, submission of specimen other than nasopharyngeal swab, presence of viral mutation(s) within the areas targeted by this assay, and inadequate number of viral copies(<138 copies/mL). A negative result must be combined with clinical observations, patient history, and epidemiological information. The expected result is Negative.  Fact Sheet for Patients:  bloggercourse.com  Fact Sheet for Healthcare Providers:  seriousbroker.it  This test is no t yet approved or cleared by the [place]  FDA and  has been authorized for detection and/or diagnosis of SARS-CoV-2 by FDA under an Emergency Use Authorization (EUA). This EUA will remain  in effect (meaning this test can be used)  for the duration of the COVID-19 declaration under Section 564(b)(1) of the Act, 21 U.S.C.section 360bbb-3(b)(1), unless the authorization is terminated  or revoked sooner.       Influenza A by PCR NEGATIVE NEGATIVE Final   Influenza B by PCR NEGATIVE NEGATIVE Final    Comment: (NOTE) The Xpert Xpress SARS-CoV-2/FLU/RSV plus assay is intended as an aid in the diagnosis of influenza from Nasopharyngeal swab specimens and should not be used as a sole basis for treatment. Nasal washings and aspirates are unacceptable for Xpert Xpress SARS-CoV-2/FLU/RSV testing.  Fact Sheet for Patients: bloggercourse.com  Fact Sheet for Healthcare Providers: seriousbroker.it  This test is not yet approved or cleared by the [place]  FDA and has been authorized for detection and/or diagnosis of SARS-CoV-2 by FDA under an Emergency Use Authorization (EUA). This EUA will remain in effect (meaning this test can be used) for the duration of the COVID-19 declaration under Section 564(b)(1) of the Act, 21 U.S.C. section 360bbb-3(b)(1), unless the authorization is terminated or revoked.     Resp Syncytial Virus by PCR NEGATIVE NEGATIVE Final    Comment: (NOTE) Fact Sheet for Patients: bloggercourse.com  Fact Sheet for Healthcare Providers: seriousbroker.it  This test is not yet approved or cleared by the [place]  FDA and has been authorized for detection and/or diagnosis of SARS-CoV-2 by FDA under an Emergency Use Authorization (EUA). This EUA will remain in effect (meaning this test can be used) for the duration of the COVID-19 declaration under Section 564(b)(1) of the Act, 21 U.S.C. section 360bbb-3(b)(1), unless the authorization is terminated or revoked.  Performed at [facility], [address], [place], [place] [address]   Respiratory (~20 pathogens) panel by PCR     Status: None    Collection Time: 10/09/24  1:15 PM   Specimen: Nasopharyngeal Swab; Respiratory  Result Value Ref Range Status   Adenovirus NOT DETECTED NOT DETECTED Final   Coronavirus 229E NOT DETECTED NOT DETECTED Final    Comment: (NOTE) The Coronavirus on the Respiratory Panel, DOES NOT test for the novel  Coronavirus (2019 nCoV)    Coronavirus HKU1 NOT DETECTED NOT DETECTED Final   Coronavirus NL63 NOT DETECTED NOT DETECTED Final   Coronavirus OC43 NOT DETECTED NOT DETECTED Final   Metapneumovirus NOT DETECTED NOT DETECTED  Final   Rhinovirus / Enterovirus NOT DETECTED NOT DETECTED Final   Influenza A NOT DETECTED NOT DETECTED Final   Influenza B NOT DETECTED NOT DETECTED Final   Parainfluenza Virus 1 NOT DETECTED NOT DETECTED Final   Parainfluenza Virus 2 NOT DETECTED NOT DETECTED Final   Parainfluenza Virus 3 NOT DETECTED NOT DETECTED Final   Parainfluenza Virus 4 NOT DETECTED NOT DETECTED Final   Respiratory Syncytial Virus NOT DETECTED NOT DETECTED Final   Bordetella pertussis NOT DETECTED NOT DETECTED Final   Bordetella Parapertussis NOT DETECTED NOT DETECTED Final   Chlamydophila pneumoniae NOT DETECTED NOT DETECTED Final   Mycoplasma pneumoniae NOT DETECTED NOT DETECTED Final    Comment: Performed at [facility], [address] [place], [place] [address]     Scheduled Meds:  amiodarone   200 mg Oral Daily   apixaban   5 mg Oral BID   atorvastatin   40 mg Oral Daily   budesonide -glycopyrrolate -formoterol   2 puff Inhalation BID   cefadroxil   500 mg Oral BID   feeding supplement  237 mL Oral BID BM   furosemide   40 mg Intravenous Daily   insulin  aspart  0-5 Units Subcutaneous QHS   insulin  aspart  0-9 Units Subcutaneous TID WC   ipratropium-albuterol   3 mL Nebulization TID   levothyroxine   100 mcg Oral QAC breakfast   pantoprazole   40 mg Oral QAC breakfast   Continuous Infusions:  Procedures/Studies: CT CHEST WO CONTRAST Result Date: 10/10/2024 EXAM: CT CHEST WITHOUT  CONTRAST 10/10/2024 04:28:35 PM TECHNIQUE: CT of the chest was performed without the administration of intravenous contrast. Multiplanar reformatted images are provided for review. Automated exposure control, iterative reconstruction, and/or weight based adjustment of the mA/kV was utilized to reduce the radiation dose to as low as reasonably achievable. COMPARISON: CT angiogram chest 07/21/2024 and chest x ray 10/18/2024. CLINICAL HISTORY: Respiratory illness, nondiagnostic xray. FINDINGS: MEDIASTINUM: The heart is moderately enlarged. Coronary and aortic atherosclerotic calcifications are noted. The central airways are clear. LYMPH NODES: Enlarged subcarinal lymph nodes measuring up to 13 mm appear unchanged. Mildly enlarged right hilar lymph nodes are grossly unchanged, but not well delineated secondary to lack of intravenous contrast. Enlarged left hilar lymph nodes measure up to 16 mm and appear unchanged. Mildly enlarged paratracheal and perivascular lymph nodes also appear unchanged. LUNGS AND PLEURA: There is a new trace left pleural effusion. There is a new small right pleural effusion which is partially loculated. Bilateral pleural calcifications are again seen, left greater than right. Numerous calcified granulomas are again seen throughout both lungs. There is some new atelectasis in the right lower lobe. There are mild ground glass opacities throughout both lungs which appears similar to prior and are nonspecific, possibly related to mild edema or infection/inflammation. No pneumothorax. SOFT TISSUES/BONES: Surgical clips in the right axilla. Sternotomy wires are present. UPPER ABDOMEN: Limited images of the upper abdomen demonstrates no acute abnormality. IMPRESSION: 1. New trace left pleural effusion and new small partially loculated right pleural effusion. 2. Mild ground glass opacities throughout both lungs, similar to prior, nonspecific, possibly related to mild edema or infection/inflammation. 3.  Enlarged subcarinal, right hilar, left hilar, paratracheal, and perivascular lymph nodes, unchanged. 4. Moderate cardiomegaly with coronary and aortic atherosclerotic calcifications. Electronically signed by: Greig Pique MD MD 10/10/2024 08:01 PM EST RP Workstation: [id number]   DG Chest Portable 1 View Result Date: 10/08/2024 EXAM: 1 VIEW(S) XRAY OF THE CHEST 10/08/2024 09:39:00 PM COMPARISON: 09/11/2024 CLINICAL HISTORY: shortness of breath FINDINGS: LINES, TUBES  AND DEVICES: Loop recorder device noted. LUNGS AND PLEURA: There is mild central pulmonary vascular congestion/interstitial edema. There is some increasing hazy opacities in the right lower lobe, which may represent atelectasis or airspace disease. Stable right pleural effusion. No pneumothorax. HEART AND MEDIASTINUM: Stable cardiomegaly. Coronary artery stent in place. Cardiac valve prostheses in place. BONES AND SOFT TISSUES: Right chest wall surgical clips noted. Nondisplaced median sternotomy wires. No acute osseous abnormality. IMPRESSION: 1. Increasing hazy right lower lobe opacities, which may represent atelectasis or airspace disease. 2. Mild central pulmonary vascular congestion with interstitial edema. 3. Stable right pleural effusion. Electronically signed by: Greig Pique MD 10/08/2024 09:45 PM EST RP Workstation: [id number]    Alm Schneider, DO  [facility]  If 7PM-7AM, please contact night-coverage [email or web address] Password [facility] 10/14/2024, 12:44 PM   LOS: 6 days   "

## 2024-10-14 NOTE — Telephone Encounter (Signed)
 Pt called in stating that he is in the hospital and will not make his transmission on the 13th and he will send one when he gets home from the ED

## 2024-10-14 NOTE — Plan of Care (Signed)
  Problem: Education: Goal: Knowledge of General Education information will improve Description: Including pain rating scale, medication(s)/side effects and non-pharmacologic comfort measures Outcome: Progressing   Problem: Clinical Measurements: Goal: Respiratory complications will improve Outcome: Progressing   Problem: Nutrition: Goal: Adequate nutrition will be maintained Outcome: Progressing   Problem: Coping: Goal: Level of anxiety will decrease Outcome: Progressing   Problem: Pain Managment: Goal: General experience of comfort will improve and/or be controlled Outcome: Progressing

## 2024-10-14 NOTE — Plan of Care (Signed)

## 2024-10-15 ENCOUNTER — Inpatient Hospital Stay (HOSPITAL_COMMUNITY)

## 2024-10-15 ENCOUNTER — Ambulatory Visit

## 2024-10-15 DIAGNOSIS — I4819 Other persistent atrial fibrillation: Secondary | ICD-10-CM

## 2024-10-15 DIAGNOSIS — I5033 Acute on chronic diastolic (congestive) heart failure: Secondary | ICD-10-CM | POA: Diagnosis not present

## 2024-10-15 DIAGNOSIS — I5031 Acute diastolic (congestive) heart failure: Secondary | ICD-10-CM | POA: Diagnosis not present

## 2024-10-15 DIAGNOSIS — I480 Paroxysmal atrial fibrillation: Secondary | ICD-10-CM | POA: Diagnosis not present

## 2024-10-15 LAB — CUP PACEART REMOTE DEVICE CHECK
Date Time Interrogation Session: 20260112234441
Implantable Pulse Generator Implant Date: 20250930

## 2024-10-15 LAB — BASIC METABOLIC PANEL WITH GFR
Anion gap: 5 (ref 5–15)
BUN: 21 mg/dL (ref 8–23)
CO2: 36 mmol/L — ABNORMAL HIGH (ref 22–32)
Calcium: 8.1 mg/dL — ABNORMAL LOW (ref 8.9–10.3)
Chloride: 95 mmol/L — ABNORMAL LOW (ref 98–111)
Creatinine, Ser: 0.95 mg/dL (ref 0.44–1.00)
GFR, Estimated: >60 mL/min
Glucose, Bld: 93 mg/dL (ref 70–99)
Potassium: 3 mmol/L — ABNORMAL LOW (ref 3.5–5.1)
Sodium: 137 mmol/L (ref 135–145)

## 2024-10-15 LAB — URINE CULTURE: Culture: 60000 — AB

## 2024-10-15 LAB — ECHOCARDIOGRAM LIMITED
Est EF: 45
Height: 62 in
S' Lateral: 2.7 cm
Weight: 2384.5 [oz_av]

## 2024-10-15 LAB — GLUCOSE, CAPILLARY
Glucose-Capillary: 119 mg/dL — ABNORMAL HIGH (ref 70–99)
Glucose-Capillary: 211 mg/dL — ABNORMAL HIGH (ref 70–99)
Glucose-Capillary: 114 mg/dL — ABNORMAL HIGH (ref 70–99)
Glucose-Capillary: 178 mg/dL — ABNORMAL HIGH (ref 70–99)

## 2024-10-15 LAB — MAGNESIUM: Magnesium: 1.8 mg/dL (ref 1.7–2.4)

## 2024-10-15 MED ORDER — POTASSIUM CHLORIDE CRYS ER 20 MEQ PO TBCR
40.0000 meq | EXTENDED_RELEASE_TABLET | Freq: Four times a day (QID) | ORAL | Status: AC
  Administered 2024-10-15 (×2): 40 meq via ORAL
  Filled 2024-10-15 (×2): qty 2

## 2024-10-15 MED ORDER — FUROSEMIDE 10 MG/ML IJ SOLN
80.0000 mg | Freq: Two times a day (BID) | INTRAMUSCULAR | Status: DC
  Administered 2024-10-15 – 2024-10-18 (×6): 80 mg via INTRAVENOUS
  Filled 2024-10-15 (×6): qty 8

## 2024-10-15 NOTE — Care Management Important Message (Signed)
 Important Message  Patient Details  Name: [name] MRN: [id number] Date of Birth: July 14, 1946   Important Message Given:  Yes - Medicare IM     [name] [name] 10/15/2024, 12:37 PM

## 2024-10-15 NOTE — Progress Notes (Signed)
 "          PROGRESS NOTE  [name] FMW:[id number] DOB: [birth year] DOA: 10/08/2024 PCP: Sheryle Carwin, MD  Brief History:  79 y.o. female with medical history significant for paroxysmal A-fib on Eliquis , chronic HFpEF, prior CVA, carotid artery stenosis, valvular heart disease, CKD 3B, hypertension, hyperlipidemia, type 2 diabetes, hypothyroidism, COPD, bioprosthetic aortic and mitral valve 07/18/2013 presenting with shortness of breath that began on the evening of 10/09/2023.  She has noted some lower extremity edema.  She denies any chest pain, fever, chills, nausea, vomiting, diarrhea.  She has a nonproductive cough. In the ED, the patient was afebrile and hemodynamically stable with oxygen  saturation 87% room air.  She was placed on 2 L with saturation up to 96%. WBC 5.7, hemoglobin 10.7, platelets 143.  Sodium 133, potassium 3.8, bicarbonate 30, serum creatinine 1.55.  Chest x-ray showed hazy right lower lobe opacity.  There was mild vascular congestion.  The patient was started on IV furosemide .   Notably, the patient had a hospital admission from 09/11/2024 to 09/17/2024 during which time she was treated for COPD exacerbation and acute on chronic HFpEF.  She was discharged home with torsemide  20 mg daily.  IV furosemide  doses were gradually escalated as the patient remains fluid overloaded.  The patient gradually improved clinically.  However she remains deconditioned.   Assessment/Plan:   Acute respiratory failure with hypoxia - Multifactorial including COPD exacerbation and CHF - Stable on 2 L nasal cannula>>96% - Wean oxygen  to room air for saturation greater 92% - 1/8 CT chest--new trace L-pleural eff and small loculated R-effusion; bilateral GGO   Acute on chronic HFpEF - 07/22/2024 echo EF 60 to 65%, no WMA, normal RVF, status post bioprosthetic AVR and MVR - Continue IV furosemide --given spot dosing each day - 1/13 lasix  increased to 80 mg IV bid - Daily weights--NEG 3 lb -  Accurate I's and O's--incomplete - 10/16/23--request thoracocentesis, increase Lasix  to 80 mg twice daily IV -Repeat limited echo   Hyponatremia -in part due to hyperglycemia and fluid overload -serum osm 279 -urine osm 194 -urine Na 38 - improving with IV lasix    E coli UTI -send urine culture -start cefadroxil  empirically D #3 of 5   CKD stage IIIb - Baseline creatinine 1.1-1.4 - Monitor with diuresis   Paroxysmal atrial fibrillation - Continue apixaban  - continue amiodarone  - currently in sinus   Elevated troponin - Troponin trend is flat, not consistent with ACS - Troponin 42>>41   Mixed hyperlipidemia - Continue statin   Diabetes mellitus type 2 - NovoLog  sliding scale - 09/12/2024 hemoglobin A1c 6.3 -  elevated CBGs secondary to steroids - novolog  sliding scale - add novolog  4 units with meals   Elevated D-dimer -very low clinical suspicion of PE -pt compliant with apixaban    Social -spouse has decided he cannot take care of pt at home, ok with pt going to SNF -request repeat PT eval>>SNF               Family Communication:   spouse at bedside 1/13   Consultants:  none   Code Status:  DNR   DVT Prophylaxis: apixaban      Procedures: As Listed in Progress Note Above   Antibiotics: None     Subjective: Patient complains of shortness of breath although it is improving.  She denies any fevers, chills, chest pain, nausea, vomiting or direct abdominal pain.  Objective: Vitals:   10/15/24 0500 10/15/24 0755 10/15/24 0756 10/15/24 1158  BP:    (!) 140/68  Pulse:    80  Resp: 13     Temp:    98.5 F (36.9 C)  TempSrc:    Oral  SpO2:  96% 96% 98%  Weight:      Height:        Intake/Output Summary (Last 24 hours) at 10/15/2024 1223 Last data filed at 10/15/2024 0500 Gross per 24 hour  Intake 480 ml  Output 2300 ml  Net -1820 ml   Weight change: -0.9 kg Exam:  General:  Pt is alert, follows commands appropriately, not in acute  distress HEENT: No icterus, No thrush, No neck mass, [place]/AT Cardiovascular: RRR, S1/S2, no rubs, no gallops Respiratory: Bibasilar crackles.  No wheezing. Abdomen: Soft/+BS, non tender, non distended, no guarding Extremities:  1 + LE edema, No lymphangitis, No petechiae, No rashes, no synovitis   Data Reviewed: I have personally reviewed following labs and imaging studies Basic Metabolic Panel: Recent Labs  Lab 10/09/24 0021 10/09/24 0427 10/10/24 0431 10/11/24 0438 10/12/24 0546 10/13/24 0545 10/14/24 0446 10/15/24 0529  NA  --    < > 131* 129* 128* 132* 134* 137  K  --    < > 4.5 4.8 4.4 3.8 3.2* 3.0*  CL  --    < > 93* 88* 88* 92* 92* 95*  CO2  --    < > 22 20* 31 33* 33* 36*  GLUCOSE  --    < > 207* 253* 129* 132* 125* 93  BUN  --    < > 18 23 28* 29* 24* 21  CREATININE  --    < > 1.53* 1.38* 1.37* 1.32* 1.09* 0.95  CALCIUM   --    < > 8.4* 8.6* 8.8* 8.5* 8.5* 8.1*  MG 1.3*  --  1.9 2.1 2.4  --  2.0 1.8  PHOS 2.5  --   --   --   --   --   --   --    < > = values in this interval not displayed.   Liver Function Tests: No results for input(s): AST, ALT, ALKPHOS, BILITOT, PROT, ALBUMIN  in the last 168 hours. No results for input(s): LIPASE, AMYLASE in the last 168 hours. No results for input(s): AMMONIA in the last 168 hours. Coagulation Profile: No results for input(s): INR, PROTIME in the last 168 hours. CBC: Recent Labs  Lab 10/08/24 2147 10/09/24 0427 10/13/24 0545  WBC 5.7 5.4 8.3  HGB 10.7* 10.6* 10.4*  HCT 33.9* 34.2* 33.2*  MCV 85.6 84.9 85.6  PLT 143* 138* 128*   Cardiac Enzymes: No results for input(s): CKTOTAL, CKMB, CKMBINDEX, TROPONINI in the last 168 hours. BNP: Invalid input(s): POCBNP CBG: Recent Labs  Lab 10/14/24 1126 10/14/24 1618 10/14/24 1953 10/15/24 0734 10/15/24 1104  GLUCAP 228* 187* 215* 119* 211*   HbA1C: No results for input(s): HGBA1C in the last 72 hours. Urine analysis:    Component  Value Date/Time   COLORURINE STRAW (A) 10/12/2024 1401   APPEARANCEUR CLEAR 10/12/2024 1401   LABSPEC 1.004 (L) 10/12/2024 1401   PHURINE 7.0 10/12/2024 1401   GLUCOSEU NEGATIVE 10/12/2024 1401   HGBUR SMALL (A) 10/12/2024 1401   BILIRUBINUR NEGATIVE 10/12/2024 1401   KETONESUR NEGATIVE 10/12/2024 1401   PROTEINUR NEGATIVE 10/12/2024 1401   UROBILINOGEN 0.2 07/07/2014 1935   NITRITE NEGATIVE 10/12/2024 1401   LEUKOCYTESUR LARGE (A) 10/12/2024 1401   Sepsis Labs: @LABRCNTIP (procalcitonin:4,lacticidven:4) ) Recent Results (from the past 240 hours)  Resp  panel by RT-PCR (RSV, Flu A&B, Covid) Anterior Nasal Swab     Status: None   Collection Time: 10/09/24 12:05 PM   Specimen: Anterior Nasal Swab  Result Value Ref Range Status   SARS Coronavirus 2 by RT PCR NEGATIVE NEGATIVE Final    Comment: (NOTE) SARS-CoV-2 target nucleic acids are NOT DETECTED.  The SARS-CoV-2 RNA is generally detectable in upper respiratory specimens during the acute phase of infection. The lowest concentration of SARS-CoV-2 viral copies this assay can detect is 138 copies/mL. A negative result does not preclude SARS-Cov-2 infection and should not be used as the sole basis for treatment or other patient management decisions. A negative result may occur with  improper specimen collection/handling, submission of specimen other than nasopharyngeal swab, presence of viral mutation(s) within the areas targeted by this assay, and inadequate number of viral copies(<138 copies/mL). A negative result must be combined with clinical observations, patient history, and epidemiological information. The expected result is Negative.  Fact Sheet for Patients:  bloggercourse.com  Fact Sheet for Healthcare Providers:  seriousbroker.it  This test is no t yet approved or cleared by the [place]  FDA and  has been authorized for detection and/or diagnosis of SARS-CoV-2  by FDA under an Emergency Use Authorization (EUA). This EUA will remain  in effect (meaning this test can be used) for the duration of the COVID-19 declaration under Section 564(b)(1) of the Act, 21 U.S.C.section 360bbb-3(b)(1), unless the authorization is terminated  or revoked sooner.       Influenza A by PCR NEGATIVE NEGATIVE Final   Influenza B by PCR NEGATIVE NEGATIVE Final    Comment: (NOTE) The Xpert Xpress SARS-CoV-2/FLU/RSV plus assay is intended as an aid in the diagnosis of influenza from Nasopharyngeal swab specimens and should not be used as a sole basis for treatment. Nasal washings and aspirates are unacceptable for Xpert Xpress SARS-CoV-2/FLU/RSV testing.  Fact Sheet for Patients: bloggercourse.com  Fact Sheet for Healthcare Providers: seriousbroker.it  This test is not yet approved or cleared by the [place]  FDA and has been authorized for detection and/or diagnosis of SARS-CoV-2 by FDA under an Emergency Use Authorization (EUA). This EUA will remain in effect (meaning this test can be used) for the duration of the COVID-19 declaration under Section 564(b)(1) of the Act, 21 U.S.C. section 360bbb-3(b)(1), unless the authorization is terminated or revoked.     Resp Syncytial Virus by PCR NEGATIVE NEGATIVE Final    Comment: (NOTE) Fact Sheet for Patients: bloggercourse.com  Fact Sheet for Healthcare Providers: seriousbroker.it  This test is not yet approved or cleared by the [place]  FDA and has been authorized for detection and/or diagnosis of SARS-CoV-2 by FDA under an Emergency Use Authorization (EUA). This EUA will remain in effect (meaning this test can be used) for the duration of the COVID-19 declaration under Section 564(b)(1) of the Act, 21 U.S.C. section 360bbb-3(b)(1), unless the authorization is terminated or revoked.  Performed at  [facility], [address], [place], [place] [address]   Respiratory (~20 pathogens) panel by PCR     Status: None   Collection Time: 10/09/24  1:15 PM   Specimen: Nasopharyngeal Swab; Respiratory  Result Value Ref Range Status   Adenovirus NOT DETECTED NOT DETECTED Final   Coronavirus 229E NOT DETECTED NOT DETECTED Final    Comment: (NOTE) The Coronavirus on the Respiratory Panel, DOES NOT test for the novel  Coronavirus (2019 nCoV)    Coronavirus HKU1 NOT DETECTED NOT DETECTED Final   Coronavirus  NL63 NOT DETECTED NOT DETECTED Final   Coronavirus OC43 NOT DETECTED NOT DETECTED Final   Metapneumovirus NOT DETECTED NOT DETECTED Final   Rhinovirus / Enterovirus NOT DETECTED NOT DETECTED Final   Influenza A NOT DETECTED NOT DETECTED Final   Influenza B NOT DETECTED NOT DETECTED Final   Parainfluenza Virus 1 NOT DETECTED NOT DETECTED Final   Parainfluenza Virus 2 NOT DETECTED NOT DETECTED Final   Parainfluenza Virus 3 NOT DETECTED NOT DETECTED Final   Parainfluenza Virus 4 NOT DETECTED NOT DETECTED Final   Respiratory Syncytial Virus NOT DETECTED NOT DETECTED Final   Bordetella pertussis NOT DETECTED NOT DETECTED Final   Bordetella Parapertussis NOT DETECTED NOT DETECTED Final   Chlamydophila pneumoniae NOT DETECTED NOT DETECTED Final   Mycoplasma pneumoniae NOT DETECTED NOT DETECTED Final    Comment: Performed at [facility], [address], [place], [place] [address]  Urine Culture (for pregnant, neutropenic or urologic patients or patients with an indwelling urinary catheter)     Status: Abnormal   Collection Time: 10/12/24  2:02 PM   Specimen: Urine, Catheterized  Result Value Ref Range Status   Specimen Description   Final    URINE, CATHETERIZED Performed at [facility], [address] [place], [place] [address]    Special Requests   Final    NONE Performed at [facility], [address] [place], [place] [address]    Culture 60,000 COLONIES/mL ESCHERICHIA COLI (A)   Final   Report Status 10/15/2024 FINAL  Final   Organism ID, Bacteria ESCHERICHIA COLI (A)  Final      Susceptibility   Escherichia coli - MIC*    AMPICILLIN  >=32 RESISTANT Resistant     CEFAZOLIN  (URINE) Value in next row Sensitive      4 SENSITIVEThis is a modified FDA-approved test that has been validated and its performance characteristics determined by the reporting laboratory.  This laboratory is certified under the Clinical Laboratory Improvement Amendments CLIA as qualified to perform high complexity clinical laboratory testing.    CEFEPIME Value in next row Sensitive      4 SENSITIVEThis is a modified FDA-approved test that has been validated and its performance characteristics determined by the reporting laboratory.  This laboratory is certified under the Clinical Laboratory Improvement Amendments CLIA as qualified to perform high complexity clinical laboratory testing.    ERTAPENEM Value in next row Sensitive      4 SENSITIVEThis is a modified FDA-approved test that has been validated and its performance characteristics determined by the reporting laboratory.  This laboratory is certified under the Clinical Laboratory Improvement Amendments CLIA as qualified to perform high complexity clinical laboratory testing.    CEFTRIAXONE  Value in next row Sensitive      4 SENSITIVEThis is a modified FDA-approved test that has been validated and its performance characteristics determined by the reporting laboratory.  This laboratory is certified under the Clinical Laboratory Improvement Amendments CLIA as qualified to perform high complexity clinical laboratory testing.    CIPROFLOXACIN Value in next row Sensitive      4 SENSITIVEThis is a modified FDA-approved test that has been validated and its performance characteristics determined by the reporting laboratory.  This laboratory is certified under the Clinical Laboratory Improvement Amendments CLIA as qualified to perform high complexity clinical  laboratory testing.    GENTAMICIN Value in next row Sensitive      4 SENSITIVEThis is a modified FDA-approved test that has been validated and its performance characteristics determined by the  reporting laboratory.  This laboratory is certified under the Clinical Laboratory Improvement Amendments CLIA as qualified to perform high complexity clinical laboratory testing.    NITROFURANTOIN  Value in next row Sensitive      4 SENSITIVEThis is a modified FDA-approved test that has been validated and its performance characteristics determined by the reporting laboratory.  This laboratory is certified under the Clinical Laboratory Improvement Amendments CLIA as qualified to perform high complexity clinical laboratory testing.    TRIMETH/SULFA Value in next row Sensitive      4 SENSITIVEThis is a modified FDA-approved test that has been validated and its performance characteristics determined by the reporting laboratory.  This laboratory is certified under the Clinical Laboratory Improvement Amendments CLIA as qualified to perform high complexity clinical laboratory testing.    AMPICILLIN /SULBACTAM Value in next row Resistant      4 SENSITIVEThis is a modified FDA-approved test that has been validated and its performance characteristics determined by the reporting laboratory.  This laboratory is certified under the Clinical Laboratory Improvement Amendments CLIA as qualified to perform high complexity clinical laboratory testing.    PIP/TAZO Value in next row Sensitive      <=4 SENSITIVEThis is a modified FDA-approved test that has been validated and its performance characteristics determined by the reporting laboratory.  This laboratory is certified under the Clinical Laboratory Improvement Amendments CLIA as qualified to perform high complexity clinical laboratory testing.    MEROPENEM Value in next row Sensitive      <=4 SENSITIVEThis is a modified FDA-approved test that has been validated and its performance  characteristics determined by the reporting laboratory.  This laboratory is certified under the Clinical Laboratory Improvement Amendments CLIA as qualified to perform high complexity clinical laboratory testing.    * 60,000 COLONIES/mL ESCHERICHIA COLI     Scheduled Meds:  amiodarone   200 mg Oral Daily   apixaban   5 mg Oral BID   atorvastatin   40 mg Oral Daily   budesonide -glycopyrrolate -formoterol   2 puff Inhalation BID   cefadroxil   500 mg Oral BID   feeding supplement  237 mL Oral BID BM   furosemide   80 mg Intravenous BID   HYDROcodone  bit-homatropine  5 mL Oral QHS   insulin  aspart  0-5 Units Subcutaneous QHS   insulin  aspart  0-9 Units Subcutaneous TID WC   ipratropium-albuterol   3 mL Nebulization TID   levothyroxine   100 mcg Oral QAC breakfast   pantoprazole   40 mg Oral QAC breakfast   potassium chloride   40 mEq Oral Q6H   Continuous Infusions:  Procedures/Studies: DG CHEST PORT 1 VIEW Result Date: 10/15/2024 EXAM: 1 VIEW XRAY OF THE CHEST 10/15/2024 11:52:00 AM COMPARISON: 10/08/2024 CLINICAL HISTORY: The patient has acute on chronic heart failure with preserved ejection fraction (HFpEF) and reports dyspnea. ICD10: I50.33 - Acute on chronic diastolic (congestive) heart failure; R06.00 - Dyspnea, unspecified. FINDINGS: LINES, TUBES AND DEVICES: Prosthetic cardiac valves noted. Left chest loop recorder or leadless pacemaker noted. LUNGS AND PLEURA: Increasing small right pleural effusion. Stable mild pulmonary edema. Chronic bilateral interstitial opacities. No pneumothorax. HEART AND MEDIASTINUM: Stable cardiomegaly and mediastinal contours. Prosthetic cardiac valves noted. Left chest loop recorder or leadless pacemaker noted. BONES AND SOFT TISSUES: Stable right chest wall surgical clips. Median sternotomy wires noted. No acute osseous abnormality. IMPRESSION: 1. Stable mild pulmonary edema and chronic bilateral interstitial opacities. 2. Increasing small right pleural effusion.  Electronically signed by: Waddell Calk MD MD 10/15/2024 12:16 PM EST RP Workstation: [id number]   CT CHEST  WO CONTRAST Result Date: 10/10/2024 EXAM: CT CHEST WITHOUT CONTRAST 10/10/2024 04:28:35 PM TECHNIQUE: CT of the chest was performed without the administration of intravenous contrast. Multiplanar reformatted images are provided for review. Automated exposure control, iterative reconstruction, and/or weight based adjustment of the mA/kV was utilized to reduce the radiation dose to as low as reasonably achievable. COMPARISON: CT angiogram chest 07/21/2024 and chest x ray 10/18/2024. CLINICAL HISTORY: Respiratory illness, nondiagnostic xray. FINDINGS: MEDIASTINUM: The heart is moderately enlarged. Coronary and aortic atherosclerotic calcifications are noted. The central airways are clear. LYMPH NODES: Enlarged subcarinal lymph nodes measuring up to 13 mm appear unchanged. Mildly enlarged right hilar lymph nodes are grossly unchanged, but not well delineated secondary to lack of intravenous contrast. Enlarged left hilar lymph nodes measure up to 16 mm and appear unchanged. Mildly enlarged paratracheal and perivascular lymph nodes also appear unchanged. LUNGS AND PLEURA: There is a new trace left pleural effusion. There is a new small right pleural effusion which is partially loculated. Bilateral pleural calcifications are again seen, left greater than right. Numerous calcified granulomas are again seen throughout both lungs. There is some new atelectasis in the right lower lobe. There are mild ground glass opacities throughout both lungs which appears similar to prior and are nonspecific, possibly related to mild edema or infection/inflammation. No pneumothorax. SOFT TISSUES/BONES: Surgical clips in the right axilla. Sternotomy wires are present. UPPER ABDOMEN: Limited images of the upper abdomen demonstrates no acute abnormality. IMPRESSION: 1. New trace left pleural effusion and new small partially loculated  right pleural effusion. 2. Mild ground glass opacities throughout both lungs, similar to prior, nonspecific, possibly related to mild edema or infection/inflammation. 3. Enlarged subcarinal, right hilar, left hilar, paratracheal, and perivascular lymph nodes, unchanged. 4. Moderate cardiomegaly with coronary and aortic atherosclerotic calcifications. Electronically signed by: Greig Pique MD MD 10/10/2024 08:01 PM EST RP Workstation: [id number]   DG Chest Portable 1 View Result Date: 10/08/2024 EXAM: 1 VIEW(S) XRAY OF THE CHEST 10/08/2024 09:39:00 PM COMPARISON: 09/11/2024 CLINICAL HISTORY: shortness of breath FINDINGS: LINES, TUBES AND DEVICES: Loop recorder device noted. LUNGS AND PLEURA: There is mild central pulmonary vascular congestion/interstitial edema. There is some increasing hazy opacities in the right lower lobe, which may represent atelectasis or airspace disease. Stable right pleural effusion. No pneumothorax. HEART AND MEDIASTINUM: Stable cardiomegaly. Coronary artery stent in place. Cardiac valve prostheses in place. BONES AND SOFT TISSUES: Right chest wall surgical clips noted. Nondisplaced median sternotomy wires. No acute osseous abnormality. IMPRESSION: 1. Increasing hazy right lower lobe opacities, which may represent atelectasis or airspace disease. 2. Mild central pulmonary vascular congestion with interstitial edema. 3. Stable right pleural effusion. Electronically signed by: Greig Pique MD 10/08/2024 09:45 PM EST RP Workstation: [id number]    Alm Schneider, DO  [facility]  If 7PM-7AM, please contact night-coverage [email or web address] Password [facility] 10/15/2024, 12:23 PM   LOS: 7 days   "

## 2024-10-15 NOTE — Progress Notes (Signed)
 Physical Therapy Treatment Patient Details Name: [name] MRN: [id number] DOB: December 12, 1945 Today's Date: 10/15/2024   History of Present Illness [name] is a 79 y.o. female with medical history significant for paroxysmal A-fib on Eliquis , chronic HFpEF, prior CVA, carotid artery stenosis, valvular heart disease, CKD 3B, hypertension, hyperlipidemia, type 2 diabetes, hypothyroidism, COPD, presents to the ER with complaints of sudden onset shortness of breath, around 8 PM tonight.  Associated with bilateral lower extremity edema.  No reported anginal symptoms.  The patient was brought into the ER for further evaluation.    PT Comments  Pt in bed; noted digging in her nose with blood on fingers... reported her nose was dry.  Spouse assisted with application of lotion into her nasal cavity and cleaning up hands following.  Pt overall with slow labored movements today.  Min-mod assist with transfer to EOB and tends to reach and try to grab things to pull her up rather than pushing with UE's. Cues provided to do so.  Pt stood with min assist to walker but ambulated from bed over to chair approx 8 feet today. Limited mostly to fatigue and mild SOB while on 3 LPM O2.  Pt situated in chair and xray present to do a procedure.      If plan is discharge home, recommend the following: A little help with walking and/or transfers;A little help with bathing/dressing/bathroom;Help with stairs or ramp for entrance;Assist for transportation;Assistance with cooking/housework   Can travel by private vehicle     Yes  Equipment Recommendations  None recommended by PT    Recommendations for Other Services       Precautions / Restrictions Precautions Precautions: Fall Recall of Precautions/Restrictions: Intact Restrictions Weight Bearing Restrictions Per Provider Order: No     Mobility  Bed Mobility Overal bed mobility: Needs Assistance Bed Mobility: Supine to Sit     Supine to sit: Min  assist, Mod assist     General bed mobility comments: pt tends to pull and reach forward rather than using UE to push    Transfers Overall transfer level: Needs assistance Equipment used: Rolling walker (2 wheels) Transfers: Sit to/from Stand, Bed to chair/wheelchair/BSC Sit to Stand: Min assist           General transfer comment: unsteady labored movement    Ambulation/Gait Ambulation/Gait assistance: Min assist Gait Distance (Feet): 8 Feet Assistive device: Rolling walker (2 wheels) Gait Pattern/deviations: Decreased step length - right, Decreased step length - left, Decreased stride length Gait velocity: dec     General Gait Details: slow labored unsteady, only ambulated from bed over to chair approx 8 feet today.  Limited mostly due to fatigue and mild SOB while on 3 LPM O2     Wheelchair Mobility     Tilt Bed    Modified Rankin (Stroke Patients Only)          Communication Communication Communication: No apparent difficulties  Cognition Arousal: Alert Behavior During Therapy: WFL for tasks assessed/performed   PT - Cognitive impairments: No apparent impairments                         Following commands: Intact      Cueing Cueing Techniques: Verbal cues, Tactile cues         Pertinent Vitals/Pain Pain Assessment Pain Assessment: No/denies pain     PT Goals (current goals can now be found in the care plan section) Acute Rehab PT Goals Patient  Stated Goal: return home after rehab PT Goal Formulation: With patient/family Time For Goal Achievement: 10/23/24 Potential to Achieve Goals: Good    Frequency    Min 3X/week       AM-PAC PT 6 Clicks Mobility   Outcome Measure  Help needed turning from your back to your side while in a flat bed without using bedrails?: A Little Help needed moving from lying on your back to sitting on the side of a flat bed without using bedrails?: A Little Help needed moving to and from a bed to  a chair (including a wheelchair)?: A Little Help needed standing up from a chair using your arms (e.g., wheelchair or bedside chair)?: A Little Help needed to walk in hospital room?: A Lot Help needed climbing 3-5 steps with a railing? : A Lot 6 Click Score: 16    End of Session Equipment Utilized During Treatment: Oxygen ;Gait belt Activity Tolerance: Patient tolerated treatment well;Patient limited by fatigue Patient left: in chair;with call bell/phone within reach;with family/visitor present Nurse Communication: Mobility status PT Visit Diagnosis: Unsteadiness on feet (R26.81);Other abnormalities of gait and mobility (R26.89);Muscle weakness (generalized) (M62.81)     Time: 8869-8851 PT Time Calculation (min) (ACUTE ONLY): 18 min  Charges:    $Therapeutic Activity: 8-22 mins PT General Charges $$ ACUTE PT VISIT: 1 Visit                    Greig KATHEE Fuse, PTA/CLT [facility] Ph: [phone]  Fuse Greig KATHEE 10/15/2024, 12:55 PM

## 2024-10-15 NOTE — Progress Notes (Signed)
 "          PROGRESS NOTE  [name] FMW:[id number] DOB: Feb 10, 1946 DOA: 10/08/2024 PCP: Sheryle Carwin, MD  Brief History:  79 y.o. female with medical history significant for paroxysmal A-fib on Eliquis , chronic HFpEF, prior CVA, carotid artery stenosis, valvular heart disease, CKD 3B, hypertension, hyperlipidemia, type 2 diabetes, hypothyroidism, COPD, bioprosthetic aortic and mitral valve 07/18/2013 presenting with shortness of breath that began on the evening of 10/09/2023.  She has noted some lower extremity edema.  She denies any chest pain, fever, chills, nausea, vomiting, diarrhea.  She has a nonproductive cough. In the ED, the patient was afebrile and hemodynamically stable with oxygen  saturation 87% room air.  She was placed on 2 L with saturation up to 96%. WBC 5.7, hemoglobin 10.7, platelets 143.  Sodium 133, potassium 3.8, bicarbonate 30, serum creatinine 1.55.  Chest x-ray showed hazy right lower lobe opacity.  There was mild vascular congestion.  The patient was started on IV furosemide .   Notably, the patient had a hospital admission from 09/11/2024 to 09/17/2024 during which time she was treated for COPD exacerbation and acute on chronic HFpEF.  She was discharged home with torsemide  20 mg daily.  IV furosemide  doses were gradually escalated as the patient remains fluid overloaded.  The patient gradually improved clinically.  However she remains deconditioned.  The patient continued to be sob although improving.  Repeat CXR on 1/13 showed Stable mild pulmonary edema and chronic bilateral interstitial opacities and increase in right small pleural effusion.  Lasix  dosing was increased, and thoracocentesis was requested   Assessment/Plan: Acute respiratory failure with hypoxia - Multifactorial including COPD exacerbation and CHF - Stable on 2 L nasal cannula>>96% - Wean oxygen  to room air for saturation greater 92% - 1/8 CT chest--new trace L-pleural eff and small loculated  R-effusion; bilateral GGO - 1/13 CXR--slight increase R-pleural effusion.  Chronic interstitial prominence   Acute on chronic HFpEF, Acute HFmrEF - 07/22/2024 echo EF 60 to 65%, no WMA, normal RVF, status post bioprosthetic AVR and MVR - Continue IV furosemide --given spot dosing each day - 1/13 lasix  increased to 80 mg IV bid - Daily weights--NEG 3 lb (question accuracy) - Accurate I's and O's--incomplete - 10/16/23--request thoracocentesis, increase Lasix  to 80 mg twice daily IV -Repeat limited echo EF 45%, RV overload - consult cardiology with new decreased EF and RV overload   Hyponatremia -in part due to hyperglycemia and fluid overload -serum osm 279 -urine osm 194 -urine Na 38 - improved with IV lasix    E coli UTI -send urine culture -started cefadroxil  D #3 of 5   CKD stage IIIb - Baseline creatinine 1.1-1.4 - Monitor with diuresis   Paroxysmal atrial fibrillation - Continue apixaban  - continue amiodarone  - currently in sinus   Elevated troponin - Troponin trend is flat, not consistent with ACS - Troponin 42>>41   Mixed hyperlipidemia - Continue statin   Diabetes mellitus type 2 - NovoLog  sliding scale - 09/12/2024 hemoglobin A1c 6.3 -  elevated CBGs secondary to steroids - novolog  sliding scale  Elevated D-dimer -very low clinical suspicion of PE -pt compliant with apixaban    Social -spouse has decided he cannot take care of pt at home, ok with pt going to SNF -request repeat PT eval>>SNF               Family Communication:   spouse at bedside 1/13   Consultants:  none   Code Status:  DNR   DVT Prophylaxis: apixaban   Procedures: As Listed in Progress Note Above   Antibiotics: None          Subjective: Pt continues to have sob, but improving.  Denies f/c, cp, n/v/d, abd pain  Objective: Vitals:   10/15/24 0500 10/15/24 0755 10/15/24 0756 10/15/24 1158  BP:    (!) 140/68  Pulse:    80  Resp: 13     Temp:    98.5 F  (36.9 C)  TempSrc:    Oral  SpO2:  96% 96% 98%  Weight:      Height:        Intake/Output Summary (Last 24 hours) at 10/15/2024 1652 Last data filed at 10/15/2024 0500 Gross per 24 hour  Intake 480 ml  Output 1400 ml  Net -920 ml   Weight change: -0.9 kg Exam:  General:  Pt is alert, follows commands appropriately, not in acute distress HEENT: No icterus, No thrush, No neck mass, [place]/AT Cardiovascular: RRR, S1/S2, no rubs, no gallops +JVD Respiratory: bibasilar crackles. No wheeze Abdomen: Soft/+BS, non tender, non distended, no guarding Extremities: 1 + LE edema, No lymphangitis, No petechiae, No rashes, no synovitis   Data Reviewed: I have personally reviewed following labs and imaging studies Basic Metabolic Panel: Recent Labs  Lab 10/09/24 0021 10/09/24 0427 10/10/24 0431 10/11/24 0438 10/12/24 0546 10/13/24 0545 10/14/24 0446 10/15/24 0529  NA  --    < > 131* 129* 128* 132* 134* 137  K  --    < > 4.5 4.8 4.4 3.8 3.2* 3.0*  CL  --    < > 93* 88* 88* 92* 92* 95*  CO2  --    < > 22 20* 31 33* 33* 36*  GLUCOSE  --    < > 207* 253* 129* 132* 125* 93  BUN  --    < > 18 23 28* 29* 24* 21  CREATININE  --    < > 1.53* 1.38* 1.37* 1.32* 1.09* 0.95  CALCIUM   --    < > 8.4* 8.6* 8.8* 8.5* 8.5* 8.1*  MG 1.3*  --  1.9 2.1 2.4  --  2.0 1.8  PHOS 2.5  --   --   --   --   --   --   --    < > = values in this interval not displayed.   Liver Function Tests: No results for input(s): AST, ALT, ALKPHOS, BILITOT, PROT, ALBUMIN  in the last 168 hours. No results for input(s): LIPASE, AMYLASE in the last 168 hours. No results for input(s): AMMONIA in the last 168 hours. Coagulation Profile: No results for input(s): INR, PROTIME in the last 168 hours. CBC: Recent Labs  Lab 10/08/24 2147 10/09/24 0427 10/13/24 0545  WBC 5.7 5.4 8.3  HGB 10.7* 10.6* 10.4*  HCT 33.9* 34.2* 33.2*  MCV 85.6 84.9 85.6  PLT 143* 138* 128*   Cardiac Enzymes: No results for  input(s): CKTOTAL, CKMB, CKMBINDEX, TROPONINI in the last 168 hours. BNP: Invalid input(s): POCBNP CBG: Recent Labs  Lab 10/14/24 1126 10/14/24 1618 10/14/24 1953 10/15/24 0734 10/15/24 1104  GLUCAP 228* 187* 215* 119* 211*   HbA1C: No results for input(s): HGBA1C in the last 72 hours. Urine analysis:    Component Value Date/Time   COLORURINE STRAW (A) 10/12/2024 1401   APPEARANCEUR CLEAR 10/12/2024 1401   LABSPEC 1.004 (L) 10/12/2024 1401   PHURINE 7.0 10/12/2024 1401   GLUCOSEU NEGATIVE 10/12/2024 1401   HGBUR SMALL (A) 10/12/2024 1401   BILIRUBINUR NEGATIVE  10/12/2024 1401   KETONESUR NEGATIVE 10/12/2024 1401   PROTEINUR NEGATIVE 10/12/2024 1401   UROBILINOGEN 0.2 07/07/2014 1935   NITRITE NEGATIVE 10/12/2024 1401   LEUKOCYTESUR LARGE (A) 10/12/2024 1401   Sepsis Labs: @LABRCNTIP (procalcitonin:4,lacticidven:4) ) Recent Results (from the past 240 hours)  Resp panel by RT-PCR (RSV, Flu A&B, Covid) Anterior Nasal Swab     Status: None   Collection Time: 10/09/24 12:05 PM   Specimen: Anterior Nasal Swab  Result Value Ref Range Status   SARS Coronavirus 2 by RT PCR NEGATIVE NEGATIVE Final    Comment: (NOTE) SARS-CoV-2 target nucleic acids are NOT DETECTED.  The SARS-CoV-2 RNA is generally detectable in upper respiratory specimens during the acute phase of infection. The lowest concentration of SARS-CoV-2 viral copies this assay can detect is 138 copies/mL. A negative result does not preclude SARS-Cov-2 infection and should not be used as the sole basis for treatment or other patient management decisions. A negative result may occur with  improper specimen collection/handling, submission of specimen other than nasopharyngeal swab, presence of viral mutation(s) within the areas targeted by this assay, and inadequate number of viral copies(<138 copies/mL). A negative result must be combined with clinical observations, patient history, and  epidemiological information. The expected result is Negative.  Fact Sheet for Patients:  bloggercourse.com  Fact Sheet for Healthcare Providers:  seriousbroker.it  This test is no t yet approved or cleared by the [place]  FDA and  has been authorized for detection and/or diagnosis of SARS-CoV-2 by FDA under an Emergency Use Authorization (EUA). This EUA will remain  in effect (meaning this test can be used) for the duration of the COVID-19 declaration under Section 564(b)(1) of the Act, 21 U.S.C.section 360bbb-3(b)(1), unless the authorization is terminated  or revoked sooner.       Influenza A by PCR NEGATIVE NEGATIVE Final   Influenza B by PCR NEGATIVE NEGATIVE Final    Comment: (NOTE) The Xpert Xpress SARS-CoV-2/FLU/RSV plus assay is intended as an aid in the diagnosis of influenza from Nasopharyngeal swab specimens and should not be used as a sole basis for treatment. Nasal washings and aspirates are unacceptable for Xpert Xpress SARS-CoV-2/FLU/RSV testing.  Fact Sheet for Patients: bloggercourse.com  Fact Sheet for Healthcare Providers: seriousbroker.it  This test is not yet approved or cleared by the [place]  FDA and has been authorized for detection and/or diagnosis of SARS-CoV-2 by FDA under an Emergency Use Authorization (EUA). This EUA will remain in effect (meaning this test can be used) for the duration of the COVID-19 declaration under Section 564(b)(1) of the Act, 21 U.S.C. section 360bbb-3(b)(1), unless the authorization is terminated or revoked.     Resp Syncytial Virus by PCR NEGATIVE NEGATIVE Final    Comment: (NOTE) Fact Sheet for Patients: bloggercourse.com  Fact Sheet for Healthcare Providers: seriousbroker.it  This test is not yet approved or cleared by the [place]  FDA and has been  authorized for detection and/or diagnosis of SARS-CoV-2 by FDA under an Emergency Use Authorization (EUA). This EUA will remain in effect (meaning this test can be used) for the duration of the COVID-19 declaration under Section 564(b)(1) of the Act, 21 U.S.C. section 360bbb-3(b)(1), unless the authorization is terminated or revoked.  Performed at [facility], [address] [place], [place] [address]   Respiratory (~20 pathogens) panel by PCR     Status: None   Collection Time: 10/09/24  1:15 PM   Specimen: Nasopharyngeal Swab; Respiratory  Result Value Ref Range Status   Adenovirus  NOT DETECTED NOT DETECTED Final   Coronavirus 229E NOT DETECTED NOT DETECTED Final    Comment: (NOTE) The Coronavirus on the Respiratory Panel, DOES NOT test for the novel  Coronavirus (2019 nCoV)    Coronavirus HKU1 NOT DETECTED NOT DETECTED Final   Coronavirus NL63 NOT DETECTED NOT DETECTED Final   Coronavirus OC43 NOT DETECTED NOT DETECTED Final   Metapneumovirus NOT DETECTED NOT DETECTED Final   Rhinovirus / Enterovirus NOT DETECTED NOT DETECTED Final   Influenza A NOT DETECTED NOT DETECTED Final   Influenza B NOT DETECTED NOT DETECTED Final   Parainfluenza Virus 1 NOT DETECTED NOT DETECTED Final   Parainfluenza Virus 2 NOT DETECTED NOT DETECTED Final   Parainfluenza Virus 3 NOT DETECTED NOT DETECTED Final   Parainfluenza Virus 4 NOT DETECTED NOT DETECTED Final   Respiratory Syncytial Virus NOT DETECTED NOT DETECTED Final   Bordetella pertussis NOT DETECTED NOT DETECTED Final   Bordetella Parapertussis NOT DETECTED NOT DETECTED Final   Chlamydophila pneumoniae NOT DETECTED NOT DETECTED Final   Mycoplasma pneumoniae NOT DETECTED NOT DETECTED Final    Comment: Performed at [facility], [address] [place], [place] [address]  Urine Culture (for pregnant, neutropenic or urologic patients or patients with an indwelling urinary catheter)     Status: Abnormal   Collection Time: 10/12/24   2:02 PM   Specimen: Urine, Catheterized  Result Value Ref Range Status   Specimen Description   Final    URINE, CATHETERIZED Performed at [facility], [address] [place], [place] [address]    Special Requests   Final    NONE Performed at [facility], [address] [place], [place] [address]    Culture 60,000 COLONIES/mL ESCHERICHIA COLI (A)  Final   Report Status 10/15/2024 FINAL  Final   Organism ID, Bacteria ESCHERICHIA COLI (A)  Final      Susceptibility   Escherichia coli - MIC*    AMPICILLIN  >=32 RESISTANT Resistant     CEFAZOLIN  (URINE) Value in next row Sensitive      4 SENSITIVEThis is a modified FDA-approved test that has been validated and its performance characteristics determined by the reporting laboratory.  This laboratory is certified under the Clinical Laboratory Improvement Amendments CLIA as qualified to perform high complexity clinical laboratory testing.    CEFEPIME Value in next row Sensitive      4 SENSITIVEThis is a modified FDA-approved test that has been validated and its performance characteristics determined by the reporting laboratory.  This laboratory is certified under the Clinical Laboratory Improvement Amendments CLIA as qualified to perform high complexity clinical laboratory testing.    ERTAPENEM Value in next row Sensitive      4 SENSITIVEThis is a modified FDA-approved test that has been validated and its performance characteristics determined by the reporting laboratory.  This laboratory is certified under the Clinical Laboratory Improvement Amendments CLIA as qualified to perform high complexity clinical laboratory testing.    CEFTRIAXONE  Value in next row Sensitive      4 SENSITIVEThis is a modified FDA-approved test that has been validated and its performance characteristics determined by the reporting laboratory.  This laboratory is certified under the Clinical Laboratory Improvement Amendments CLIA as qualified to perform high complexity  clinical laboratory testing.    CIPROFLOXACIN Value in next row Sensitive      4 SENSITIVEThis is a modified FDA-approved test that has been validated and its performance characteristics determined by the reporting laboratory.  This laboratory is certified  under the Clinical Laboratory Improvement Amendments CLIA as qualified to perform high complexity clinical laboratory testing.    GENTAMICIN Value in next row Sensitive      4 SENSITIVEThis is a modified FDA-approved test that has been validated and its performance characteristics determined by the reporting laboratory.  This laboratory is certified under the Clinical Laboratory Improvement Amendments CLIA as qualified to perform high complexity clinical laboratory testing.    NITROFURANTOIN  Value in next row Sensitive      4 SENSITIVEThis is a modified FDA-approved test that has been validated and its performance characteristics determined by the reporting laboratory.  This laboratory is certified under the Clinical Laboratory Improvement Amendments CLIA as qualified to perform high complexity clinical laboratory testing.    TRIMETH/SULFA Value in next row Sensitive      4 SENSITIVEThis is a modified FDA-approved test that has been validated and its performance characteristics determined by the reporting laboratory.  This laboratory is certified under the Clinical Laboratory Improvement Amendments CLIA as qualified to perform high complexity clinical laboratory testing.    AMPICILLIN /SULBACTAM Value in next row Resistant      4 SENSITIVEThis is a modified FDA-approved test that has been validated and its performance characteristics determined by the reporting laboratory.  This laboratory is certified under the Clinical Laboratory Improvement Amendments CLIA as qualified to perform high complexity clinical laboratory testing.    PIP/TAZO Value in next row Sensitive      <=4 SENSITIVEThis is a modified FDA-approved test that has been validated and its  performance characteristics determined by the reporting laboratory.  This laboratory is certified under the Clinical Laboratory Improvement Amendments CLIA as qualified to perform high complexity clinical laboratory testing.    MEROPENEM Value in next row Sensitive      <=4 SENSITIVEThis is a modified FDA-approved test that has been validated and its performance characteristics determined by the reporting laboratory.  This laboratory is certified under the Clinical Laboratory Improvement Amendments CLIA as qualified to perform high complexity clinical laboratory testing.    * 60,000 COLONIES/mL ESCHERICHIA COLI     Scheduled Meds:  amiodarone   200 mg Oral Daily   apixaban   5 mg Oral BID   atorvastatin   40 mg Oral Daily   budesonide -glycopyrrolate -formoterol   2 puff Inhalation BID   cefadroxil   500 mg Oral BID   feeding supplement  237 mL Oral BID BM   furosemide   80 mg Intravenous BID   HYDROcodone  bit-homatropine  5 mL Oral QHS   insulin  aspart  0-5 Units Subcutaneous QHS   insulin  aspart  0-9 Units Subcutaneous TID WC   ipratropium-albuterol   3 mL Nebulization TID   levothyroxine   100 mcg Oral QAC breakfast   pantoprazole   40 mg Oral QAC breakfast   Continuous Infusions:  Procedures/Studies: ECHOCARDIOGRAM LIMITED Result Date: 10/15/2024    ECHOCARDIOGRAM LIMITED REPORT   Patient Name:   [name] Date of Exam: 10/15/2024 Medical Rec #:  [id number]       Height:       62.0 in Accession #:    [id number]      Weight:       149.0 lb Date of Birth:  1946-01-22       BSA:          1.687 m Patient Age:    78 years        BP:           140/68 mmHg Patient Gender: F  HR:           78 bpm. Exam Location:  [facility] Procedure: Limited Echo, Cardiac Doppler and Color Doppler (Both Spectral and            Color Flow Doppler were utilized during procedure). Indications:    CHF I50.31  History:        Patient has prior history of Echocardiogram examinations, most                 recent  07/22/2024. CHF, Stroke and COPD, Arrythmias:Atrial                 Fibrillation; Risk Factors:Hypertension, Diabetes and                 Dyslipidemia.                 Aortic Valve: 21 mm Magna bioprosthetic valve is present in the                 aortic position. Procedure Date: 07/18/2013.                 Mitral Valve: 25 mm Manga Ease bioprosthetic valve valve is                 present in the mitral position. Procedure Date: 07/18/13.  Sonographer:    Tinnie Gosling RDCS Referring Phys: [id number] Perri Lamagna IMPRESSIONS  1. Left ventricular ejection fraction, by estimation, is 45%. The left ventricle has mildly decreased function. Left ventricular endocardial border not optimally defined to evaluate regional wall motion. Left ventricular diastolic function could not be evaluated. There is the interventricular septum is flattened in systole and diastole, consistent with right ventricular pressure and volume overload.  2. Right ventricular systolic function is severely reduced. The right ventricular size is moderately enlarged. There is moderately elevated pulmonary artery systolic pressure. The estimated right ventricular systolic pressure is 54.8 mmHg.  3. The inferior vena cava is normal in size with <50% respiratory variability, suggesting right atrial pressure of 8 mmHg. Comparison(s): Changes from prior study are noted. LV and RV function worsened. FINDINGS  Left Ventricle: Left ventricular ejection fraction, by estimation, is 45%. The left ventricle has mildly decreased function. Left ventricular endocardial border not optimally defined to evaluate regional wall motion. The interventricular septum is flattened in systole and diastole, consistent with right ventricular pressure and volume overload. Left ventricular diastolic function could not be evaluated. Right Ventricle: The right ventricular size is moderately enlarged. No increase in right ventricular wall thickness. Right ventricular systolic function is  severely reduced. There is moderately elevated pulmonary artery systolic pressure. The tricuspid regurgitant velocity is 3.42 m/s, and with an assumed right atrial pressure of 8 mmHg, the estimated right ventricular systolic pressure is 54.8 mmHg. Mitral Valve: The mitral valve has been repaired/replaced. There is a 25 mm Manga Ease bioprosthetic valve present in the mitral position. Procedure Date: 07/18/13. Tricuspid Valve: The tricuspid valve is normal in structure. Tricuspid valve regurgitation is mild. Aortic Valve: The aortic valve has been repaired/replaced. There is a 21 mm Magna bioprosthetic valve present in the aortic position. Procedure Date: 07/18/2013. Venous: The inferior vena cava is normal in size with less than 50% respiratory variability, suggesting right atrial pressure of 8 mmHg. LEFT VENTRICLE PLAX 2D LVIDd:         3.50 cm LVIDs:         2.70 cm LV PW:  1.10 cm LV IVS:        0.90 cm  IVC IVC diam: 1.70 cm RIGHT ATRIUM           Index RA Area:     17.00 cm RA Volume:   45.40 ml  26.91 ml/m  TRICUSPID VALVE TR Peak grad:   46.8 mmHg TR Vmax:        342.00 cm/s Vishnu Priya Mallipeddi Electronically signed by Diannah Late Mallipeddi Signature Date/Time: 10/15/2024/4:35:11 PM    Final    CUP PACEART REMOTE DEVICE CHECK Result Date: 10/15/2024 ILR summary report received. Battery status OK. Normal device function. No new symptom, tachy, brady, or pause episodes. No new AF episodes. Monthly summary reports and ROV/PRN LA, CVRS  DG CHEST PORT 1 VIEW Result Date: 10/15/2024 EXAM: 1 VIEW XRAY OF THE CHEST 10/15/2024 11:52:00 AM COMPARISON: 10/08/2024 CLINICAL HISTORY: The patient has acute on chronic heart failure with preserved ejection fraction (HFpEF) and reports dyspnea. ICD10: I50.33 - Acute on chronic diastolic (congestive) heart failure; R06.00 - Dyspnea, unspecified. FINDINGS: LINES, TUBES AND DEVICES: Prosthetic cardiac valves noted. Left chest loop recorder or leadless  pacemaker noted. LUNGS AND PLEURA: Increasing small right pleural effusion. Stable mild pulmonary edema. Chronic bilateral interstitial opacities. No pneumothorax. HEART AND MEDIASTINUM: Stable cardiomegaly and mediastinal contours. Prosthetic cardiac valves noted. Left chest loop recorder or leadless pacemaker noted. BONES AND SOFT TISSUES: Stable right chest wall surgical clips. Median sternotomy wires noted. No acute osseous abnormality. IMPRESSION: 1. Stable mild pulmonary edema and chronic bilateral interstitial opacities. 2. Increasing small right pleural effusion. Electronically signed by: Waddell Calk MD MD 10/15/2024 12:16 PM EST RP Workstation: [id number]   CT CHEST WO CONTRAST Result Date: 10/10/2024 EXAM: CT CHEST WITHOUT CONTRAST 10/10/2024 04:28:35 PM TECHNIQUE: CT of the chest was performed without the administration of intravenous contrast. Multiplanar reformatted images are provided for review. Automated exposure control, iterative reconstruction, and/or weight based adjustment of the mA/kV was utilized to reduce the radiation dose to as low as reasonably achievable. COMPARISON: CT angiogram chest 07/21/2024 and chest x ray 10/18/2024. CLINICAL HISTORY: Respiratory illness, nondiagnostic xray. FINDINGS: MEDIASTINUM: The heart is moderately enlarged. Coronary and aortic atherosclerotic calcifications are noted. The central airways are clear. LYMPH NODES: Enlarged subcarinal lymph nodes measuring up to 13 mm appear unchanged. Mildly enlarged right hilar lymph nodes are grossly unchanged, but not well delineated secondary to lack of intravenous contrast. Enlarged left hilar lymph nodes measure up to 16 mm and appear unchanged. Mildly enlarged paratracheal and perivascular lymph nodes also appear unchanged. LUNGS AND PLEURA: There is a new trace left pleural effusion. There is a new small right pleural effusion which is partially loculated. Bilateral pleural calcifications are again seen, left  greater than right. Numerous calcified granulomas are again seen throughout both lungs. There is some new atelectasis in the right lower lobe. There are mild ground glass opacities throughout both lungs which appears similar to prior and are nonspecific, possibly related to mild edema or infection/inflammation. No pneumothorax. SOFT TISSUES/BONES: Surgical clips in the right axilla. Sternotomy wires are present. UPPER ABDOMEN: Limited images of the upper abdomen demonstrates no acute abnormality. IMPRESSION: 1. New trace left pleural effusion and new small partially loculated right pleural effusion. 2. Mild ground glass opacities throughout both lungs, similar to prior, nonspecific, possibly related to mild edema or infection/inflammation. 3. Enlarged subcarinal, right hilar, left hilar, paratracheal, and perivascular lymph nodes, unchanged. 4. Moderate cardiomegaly with coronary and aortic atherosclerotic calcifications. Electronically signed by: Greig  Maple MD MD 10/10/2024 08:01 PM EST RP Workstation: [id number]   DG Chest Portable 1 View Result Date: 10/08/2024 EXAM: 1 VIEW(S) XRAY OF THE CHEST 10/08/2024 09:39:00 PM COMPARISON: 09/11/2024 CLINICAL HISTORY: shortness of breath FINDINGS: LINES, TUBES AND DEVICES: Loop recorder device noted. LUNGS AND PLEURA: There is mild central pulmonary vascular congestion/interstitial edema. There is some increasing hazy opacities in the right lower lobe, which may represent atelectasis or airspace disease. Stable right pleural effusion. No pneumothorax. HEART AND MEDIASTINUM: Stable cardiomegaly. Coronary artery stent in place. Cardiac valve prostheses in place. BONES AND SOFT TISSUES: Right chest wall surgical clips noted. Nondisplaced median sternotomy wires. No acute osseous abnormality. IMPRESSION: 1. Increasing hazy right lower lobe opacities, which may represent atelectasis or airspace disease. 2. Mild central pulmonary vascular congestion with interstitial edema.  3. Stable right pleural effusion. Electronically signed by: Greig Maple MD 10/08/2024 09:45 PM EST RP Workstation: [id number]    Alm Schneider, DO  [facility]  If 7PM-7AM, please contact night-coverage [email or web address] Password [email or web address] 10/15/2024, 4:52 PM   LOS: 7 days   "

## 2024-10-15 NOTE — Progress Notes (Signed)
 Mobility Specialist Progress Note:    10/15/24 1330  Mobility  Activity Ambulated with assistance  Level of Assistance Minimal assist, patient does 75% or more  Assistive Device Front wheel walker  Distance Ambulated (ft) 25 ft  Range of Motion/Exercises Active;All extremities  Activity Response Tolerated well  Mobility Referral Yes  Mobility visit 1 Mobility  Mobility Specialist Start Time (ACUTE ONLY) 1330  Mobility Specialist Stop Time (ACUTE ONLY) 1350  Mobility Specialist Time Calculation (min) (ACUTE ONLY) 20 min   Pt received in chair, agreeable to mobility. Required MinA to stand and CGA to ambulate with RW. Tolerated well, fatigues quickly and required several rest breaks during session. SpO2 98% on 2L at rest, SpO2 95% on 2L during ambulation. Returned to chair, husband in room. All needs met.  Jema Deegan Mobility Specialist Please contact via Special Educational Needs Teacher or  Rehab office at [phone]

## 2024-10-15 NOTE — TOC Progression Note (Addendum)
 Transition of Care [facility]) - Progression Note    Patient Details  Name: [name] MRN: [id number] Date of Birth: Jul 29, 1946  Transition of Care [facility]) CM/SW Contact  [name], [name] Phone Number: 10/15/2024, 8:50 AM  Clinical Narrative:  Bed accepted at [facility]. Facility notified. Insurance updated. Auth pending.    Update: Auth approved. 1/13 - 1/15, [name] id [id number], plan auth id [id number]   Expected Discharge Plan: Home w Home Health Services Barriers to Discharge: Continued Medical Work up               Expected Discharge Plan and Services In-house Referral: Clinical Social Work   Post Acute Care Choice: Resumption of Svcs/PTA Provider Living arrangements for the past 2 months: Single Family Home                           HH Arranged: PT HH Agency: [facility] Date [facility] Agency Contacted: 10/09/24 Time HH Agency Contacted: [id number] Representative spoke with at [facility] Agency: Darleene   Social Drivers of Health (SDOH) Interventions SDOH Screenings   Food Insecurity: No Food Insecurity (10/09/2024)  Housing: Low Risk (10/09/2024)  Transportation Needs: No Transportation Needs (10/09/2024)  Utilities: Not At Risk (10/09/2024)  Social Connections: Moderately Isolated (10/09/2024)  Tobacco Use: Low Risk (10/08/2024)    Readmission Risk Interventions    10/09/2024   11:16 AM 09/17/2024   11:14 AM 09/16/2024   11:53 AM  Readmission Risk Prevention Plan  Transportation Screening Complete Complete Complete  Medication Review Oceanographer) Complete Complete Complete  PCP or Specialist appointment within 3-5 days of discharge  Complete   HRI or Home Care Consult Complete Complete Complete  SW Recovery Care/Counseling Consult Complete Complete Complete  Palliative Care Screening Not Applicable Not Applicable Not Applicable  Skilled Nursing Facility Not Applicable Not Applicable Not Applicable

## 2024-10-16 ENCOUNTER — Inpatient Hospital Stay (HOSPITAL_COMMUNITY)

## 2024-10-16 ENCOUNTER — Encounter (HOSPITAL_COMMUNITY): Payer: Self-pay | Admitting: Internal Medicine

## 2024-10-16 DIAGNOSIS — I480 Paroxysmal atrial fibrillation: Secondary | ICD-10-CM | POA: Diagnosis not present

## 2024-10-16 DIAGNOSIS — I350 Nonrheumatic aortic (valve) stenosis: Secondary | ICD-10-CM | POA: Diagnosis not present

## 2024-10-16 DIAGNOSIS — I50810 Right heart failure, unspecified: Secondary | ICD-10-CM | POA: Diagnosis not present

## 2024-10-16 DIAGNOSIS — Z7901 Long term (current) use of anticoagulants: Secondary | ICD-10-CM

## 2024-10-16 DIAGNOSIS — Z952 Presence of prosthetic heart valve: Secondary | ICD-10-CM | POA: Diagnosis not present

## 2024-10-16 DIAGNOSIS — J9601 Acute respiratory failure with hypoxia: Secondary | ICD-10-CM | POA: Diagnosis not present

## 2024-10-16 DIAGNOSIS — I5082 Biventricular heart failure: Secondary | ICD-10-CM

## 2024-10-16 DIAGNOSIS — I5033 Acute on chronic diastolic (congestive) heart failure: Secondary | ICD-10-CM | POA: Diagnosis not present

## 2024-10-16 DIAGNOSIS — Z79899 Other long term (current) drug therapy: Secondary | ICD-10-CM

## 2024-10-16 DIAGNOSIS — I050 Rheumatic mitral stenosis: Secondary | ICD-10-CM | POA: Diagnosis not present

## 2024-10-16 DIAGNOSIS — I5041 Acute combined systolic (congestive) and diastolic (congestive) heart failure: Secondary | ICD-10-CM | POA: Diagnosis not present

## 2024-10-16 LAB — COMPREHENSIVE METABOLIC PANEL WITH GFR
ALT: 43 U/L (ref 0–44)
AST: 35 U/L (ref 15–41)
Albumin: 3.5 g/dL (ref 3.5–5.0)
Alkaline Phosphatase: 73 U/L (ref 38–126)
Anion gap: 12 (ref 5–15)
BUN: 18 mg/dL (ref 8–23)
CO2: 29 mmol/L (ref 22–32)
Calcium: 8.5 mg/dL — ABNORMAL LOW (ref 8.9–10.3)
Chloride: 94 mmol/L — ABNORMAL LOW (ref 98–111)
Creatinine, Ser: 1.15 mg/dL — ABNORMAL HIGH (ref 0.44–1.00)
GFR, Estimated: 48 mL/min — ABNORMAL LOW
Glucose, Bld: 148 mg/dL — ABNORMAL HIGH (ref 70–99)
Potassium: 3.7 mmol/L (ref 3.5–5.1)
Sodium: 136 mmol/L (ref 135–145)
Total Bilirubin: 1 mg/dL (ref 0.0–1.2)
Total Protein: 6.6 g/dL (ref 6.5–8.1)

## 2024-10-16 LAB — GLUCOSE, CAPILLARY
Glucose-Capillary: 276 mg/dL — ABNORMAL HIGH (ref 70–99)
Glucose-Capillary: 142 mg/dL — ABNORMAL HIGH (ref 70–99)
Glucose-Capillary: 169 mg/dL — ABNORMAL HIGH (ref 70–99)
Glucose-Capillary: 151 mg/dL — ABNORMAL HIGH (ref 70–99)

## 2024-10-16 LAB — MAGNESIUM: Magnesium: 1.7 mg/dL (ref 1.7–2.4)

## 2024-10-16 LAB — LACTATE DEHYDROGENASE: LDH: 629 U/L — ABNORMAL HIGH (ref 105–235)

## 2024-10-16 MED ORDER — LIDOCAINE HCL (PF) 2 % IJ SOLN
10.0000 mL | Freq: Once | INTRAMUSCULAR | Status: DC
  Filled 2024-10-16: qty 10

## 2024-10-16 MED ORDER — TECHNETIUM TO 99M ALBUMIN AGGREGATED
4.4000 | Freq: Once | INTRAVENOUS | Status: AC | PRN
  Administered 2024-10-16: 4.4 via INTRAVENOUS

## 2024-10-16 MED ORDER — LIDOCAINE HCL (PF) 2 % IJ SOLN
INTRAMUSCULAR | Status: AC
  Filled 2024-10-16: qty 10

## 2024-10-16 NOTE — Plan of Care (Signed)

## 2024-10-16 NOTE — Progress Notes (Signed)
 Heart Failure Nurse Navigator Progress Note  PCP: Sheryle Carwin, MD PCP-Cardiologist: Dorn Ross, MD Admission Diagnosis: Acute on Chronic Congestive Heart Failure, unspecified heart failure type [facility]) Admitted from: Home [facility] Patient- Patient is currently admitted to [facility].  CHF Education provided and CHF TOC scheduled for her in a few weeks because she is still hospitalized and will go to a SNF for 2 weeks prior to going back home.  2 patient identifiers confirmed prior to telephone conversation.  Presentation:   [name] is a 79 yo. Female.  She presented with shortness of breath. She has a history of A-fib on Eliquis , Chronic HFpEF, Prior CVA, Carotid artery stenosis, valvular heart disease, CKD 3B, hypertension, hyperlipidemia, type 2 diabetes, hypothyroidism,COPD, bioprosthetic aortic and mitral valve 07/18/2013. [name]  Per notes she had noticed more fluid on her legs over the last few days. Pro-BNP 5,574. HS-Troponin 41. Chest x-ray: 10/15/24 stable mild pulmonary edema and chronic bilateral interstitial opacities and increase in right small pleural effusion.    ECHO/ LVEF: 45%.  New decreased EF and RV overload.  Clinical Course:  Past Medical History:  Diagnosis Date   Anemia    Asthma 2010   Atrial fibrillation (HCC)    Atrial flutter (HCC)    Breast carcinoma (HCC) 1995   1995   Cardioembolic stroke (HCC) 01/2012   Right frontal in 01/2012; normal carotid ultrasound; possible LAA thrombus by TEE; virtual complete neurologic recovery   Carotid artery stenosis 1961   Chronic kidney disease, stage 2, mildly decreased GFR    GFR of approximately 60   Diabetes mellitus without complication (HCC) 5+ years ago   Controlled most of time   Diverticulosis of colon (without mention of hemorrhage) 2012   Dr. Golda   Fasting hyperglycemia    120 fasting   Gastroesophageal reflux disease    Gastroparesis    Heart failure with improved ejection fraction (HFimpEF)  (HCC) 5+ years   Heart murmur 1961   Hemorrhoids    History of aortic valve replacement with bioprosthetic valve    History of mitral valve replacement with bioprosthetic valve    Hyperlipidemia    Hypertension    pt denies 05/30/13     Dr Ross [place]   Hyponatremia    Hypothyroidism    Rheumatic heart disease    Shortness of breath    Syncope and collapse February, 2024     Social History   Socioeconomic History   Marital status: Married    Spouse name: Not on file   Number of children: 1   Years of education: Not on file   Highest education level: Not on file  Occupational History   Occupation: Retired Magazine Features Editor: RETIRED  Tobacco Use   Smoking status: Never   Smokeless tobacco: Never  Vaping Use   Vaping status: Never Used  Substance and Sexual Activity   Alcohol use: No    Alcohol/week: 0.0 standard drinks of alcohol   Drug use: No   Sexual activity: Not Currently  Other Topics Concern   Not on file  Social History Narrative   Married with children   No regular exercise   Social Drivers of Health   Tobacco Use: Low Risk (10/08/2024)   Patient History    Smoking Tobacco Use: Never    Smokeless Tobacco Use: Never    Passive Exposure: Not on file  Financial Resource Strain: Not on file  Food Insecurity: No Food Insecurity (10/09/2024)  Epic    Worried About Programme Researcher, Broadcasting/film/video in the Last Year: Never true    [employer] of Food in the Last Year: Never true  Transportation Needs: No Transportation Needs (10/09/2024)   Epic    Lack of Transportation (Medical): No    Lack of Transportation (Non-Medical): No  Physical Activity: Not on file  Stress: Not on file  Social Connections: Moderately Isolated (10/09/2024)   Social Connection and Isolation Panel    Frequency of Communication with Friends and Family: Once a week    Frequency of Social Gatherings with Friends and Family: Once a week    Attends Religious Services: 1 to 4 times per year    Active  Member of [employer] or Organizations: No    Attends Banker Meetings: Never    Marital Status: Married  Depression (PHQ2-9): Not on file  Alcohol Screen: Not on file  Housing: Low Risk (10/09/2024)   Epic    Unable to Pay for Housing in the Last Year: No    Number of Times Moved in the Last Year: 0    Homeless in the Last Year: No  Utilities: Not At Risk (10/09/2024)   Epic    Threatened with loss of utilities: No  Health Literacy: Not on file   Education Assessment and Provision:  Detailed education and instructions provided on heart failure disease management including the following:  Signs and symptoms of Heart Failure When to call the physician Importance of daily weights Low sodium diet Fluid restriction Medication management Anticipated future follow-up appointments  Patient education given on each of the above topics.  Patient acknowledges understanding via teach back method and acceptance of all instructions.  Education Materials:  Living Better With Heart Failure Booklet, HF zone tool, & Daily Weight Tracker Tool.  Patient has scale at home: Yes Patient has pill box at home: Yes-husband fills up weekly pill boxes.     High Risk Criteria for Readmission and/or Poor Patient Outcomes: Heart failure hospital admissions (last 6 months): 1  No Show rate: 0% Difficult social situation: None determined at this time. Demonstrates medication adherence: Per husband-yes Primary Language: English Literacy level: Reading, Writing & Comprehension  Barriers of Care:   Daily Weights Diet & Fluid Restrictions After discharge from [facility] she will be going to a SNF for 2 weeks before returning back home.   Considerations/Referrals:  Referral made to Heart Failure Pharmacist Stewardship: N/A Referral made to Heart Failure CSW/NCM TOC: No Referral made to Heart & Vascular TOC clinic: Yes.  11/11/2024 @ 11:15 [facility]  Advanced Heart Failure Clinic.  Items for  Follow-up on DC/TOC: Daily Weights Diet & Fluid Restrictions Continued Heart Failure Education  Charmaine Pines, RN, BSN [facility] Heart Failure Navigator Secure Chat Only

## 2024-10-16 NOTE — Progress Notes (Signed)
 Physical Therapy Treatment Patient Details Name: [name] MRN: [id number] DOB: [birth year] Today's Date: 10/16/2024   History of Present Illness [name] is a 79 y.o. female with medical history significant for paroxysmal A-fib on Eliquis , chronic HFpEF, prior CVA, carotid artery stenosis, valvular heart disease, CKD 3B, hypertension, hyperlipidemia, type 2 diabetes, hypothyroidism, COPD, presents to the ER with complaints of sudden onset shortness of breath, around 8 PM tonight.  Associated with bilateral lower extremity edema.  No reported anginal symptoms.  The patient was brought into the ER for further evaluation.    PT Comments  Pt supine in bed with husband present.  Pt presents with generalized weakness with increased time required and cueing for hand placement to assist with bed mobility.  Pt c/o SOB, on 1 L O2 via nasal canal, checked O2 saturation that was initially 91%, instructed deep breathing through nose with ability to increased O2 sat to 91-94%.  Min A with transfer training to standing, pt tendency to reach for walker, instructed to push with UE from bed to assist to standing.  Ambulated with RW to rolling bed for procedure to be taken downstairs, slow labored movements with cueing to stand inside walker.    If plan is discharge home, recommend the following:     Can travel by private vehicle        Equipment Recommendations       Recommendations for Other Services       Precautions / Restrictions Precautions Precautions: Fall Recall of Precautions/Restrictions: Intact     Mobility  Bed Mobility Overal bed mobility: Needs Assistance Bed Mobility: Supine to Sit     Supine to sit: Min assist, Mod assist     General bed mobility comments: pt tends to pull and reach forward rather than using UE to push, increased time, labored movements    Transfers Overall transfer level: Needs assistance Equipment used: Rolling walker (2 wheels) Transfers: Sit  to/from Stand Sit to Stand: Min assist           General transfer comment: Cueing for handplacement, tends to reach for RW, instructed to push with hands from bed    Ambulation/Gait Ambulation/Gait assistance: Min assist Gait Distance (Feet): 8 Feet Assistive device: Rolling walker (2 wheels) Gait Pattern/deviations: Decreased step length - right, Decreased step length - left, Decreased stride length Gait velocity: dec     General Gait Details: slow labored movements, ambulated from bed to rolling bed to take for procedure with RW   Stairs             Wheelchair Mobility     Tilt Bed    Modified Rankin (Stroke Patients Only)       Balance                                            Communication    Cognition Arousal: Alert Behavior During Therapy: WFL for tasks assessed/performed   PT - Cognitive impairments: No apparent impairments                                Cueing    Exercises Other Exercises Other Exercises: Bridge to assist sliding up bed and for therapist to remove belt off waist x 2    General Comments  Pertinent Vitals/Pain Pain Assessment Pain Assessment: No/denies pain    Home Living                          Prior Function            PT Goals (current goals can now be found in the care plan section)      Frequency           PT Plan      Co-evaluation              AM-PAC PT 6 Clicks Mobility   Outcome Measure  Help needed turning from your back to your side while in a flat bed without using bedrails?: A Little Help needed moving from lying on your back to sitting on the side of a flat bed without using bedrails?: A Little Help needed moving to and from a bed to a chair (including a wheelchair)?: A Little Help needed standing up from a chair using your arms (e.g., wheelchair or bedside chair)?: A Little Help needed to walk in hospital room?: A Lot Help needed  climbing 3-5 steps with a railing? : A Lot 6 Click Score: 16    End of Session Equipment Utilized During Treatment: Oxygen ;Gait belt Activity Tolerance: Patient tolerated treatment well;Patient limited by fatigue Patient left: Other (comment) (Rolling bed to take downstairs for procedure)   PT Visit Diagnosis: Unsteadiness on feet (R26.81);Other abnormalities of gait and mobility (R26.89);Muscle weakness (generalized) (M62.81)     Time: 9099-9081 PT Time Calculation (min) (ACUTE ONLY): 18 min  Charges:    $Therapeutic Activity: 8-22 mins PT General Charges $$ ACUTE PT VISIT: 1 Visit           Augustin Mclean, LPTA/CLT; CBIS [phone]  Mclean Augustin Amble 10/16/2024, 9:27 AM

## 2024-10-16 NOTE — Progress Notes (Addendum)
 Patient presents for right sided therapeutic and diagnostic thoracentesis. US  limited shows trace amount of pleural fluid noted. Patient also uncooperative. Will not stay in procedural position or follow commands. Insufficient and not safe to perform a thoracentesis. Procedure not performed.

## 2024-10-16 NOTE — Progress Notes (Signed)
 OT Cancellation Note  Patient Details Name: [name] MRN: [id number] DOB: 1946-07-04   Cancelled Treatment:    Reason Eval/Treat Not Completed: Other (comment) (Pt receving breathing treatment) will f/u as schedule allows   Chiquita LOISE Sermon, OTR/L  10/16/2024, 3:20 PM

## 2024-10-16 NOTE — Consult Note (Addendum)
 "   CARDIOLOGY CONSULT NOTE    Patient ID: [name]; [id number]; [birth year]   Admit date: 10/08/2024 Date of Consult: 10/16/2024  Primary Care Provider: Sheryle Carwin, MD Primary Cardiologist:  Primary Electrophysiologist:     Patient Profile:   [name] is a 79 y.o. female who is being seen today for the evaluation of CHF at the request of Dr Maree.  History of Present Illness:   [name] is a 79 year old F known to have chronic diastolic heart failure, s/p prosthetic AVR and MVR in 2014, moderate COPD (per PFTs in 2014), paroxysmal atrial fibrillation, HTN, DM 2, HLD presented to the ER with worsening DOE, orthopnea and bilateral leg swelling x few weeks.  Hypoxic 87% upon arrival to the ER, on 2L [place] oxygen  now saturating well.  Patient is currently admitted to hospitalist team for the management of acute hypoxic respiratory failure from acute on chronic diastolic heart failure.  She was previously admitted and discharged for ADHF.  Currently on IV Lasix  80 mg twice daily.  She has a history of CKD stage IIIb.  Echocardiogram this admission showed LVEF 45%, severely reduced RV systolic function, moderately enlarged RV, moderate pulmonary hypertension and CVP 8 mmHg.  Cardiology is consulted for the management of biventricular heart failure. She underwent ultrasound thoracentesis this morning however procedure unable to be performed due to small pleural effusion.  She has been drowsy since.  Collateral history is obtained from the patient's husband at the bedside and the patient's son.  Patient usually plays golf and active however since September 2025 after a fall, she has been going downhill.  She had multiple hospitalizations since then, had UTI, had COPD exacerbation/CHF exacerbation in December 2025 and now back again with CHF.  She does not have adequate strength either.  Family asking when she would be going to [facility] to get more strength.  EKG on admission showed normal  sinus rhythm with first-degree AV block with prolonged PR interval, 281 ms. proBNP elevated, 5574. Hs troponins mildly elevated, 42>>41.  Past Medical History:  Diagnosis Date   Anemia    Asthma 2010   Atrial fibrillation (HCC)    Atrial flutter (HCC)    Breast carcinoma (HCC) 1995   1995   Cardioembolic stroke (HCC) 01/2012   Right frontal in 01/2012; normal carotid ultrasound; possible LAA thrombus by TEE; virtual complete neurologic recovery   Carotid artery stenosis 1961   Chronic kidney disease, stage 2, mildly decreased GFR    GFR of approximately 60   Diabetes mellitus without complication (HCC) 5+ years ago   Controlled most of time   Diverticulosis of colon (without mention of hemorrhage) 2012   Dr. Golda   Fasting hyperglycemia    120 fasting   Gastroesophageal reflux disease    Gastroparesis    Heart failure with improved ejection fraction (HFimpEF) (HCC) 5+ years   Heart murmur 1961   Hemorrhoids    History of aortic valve replacement with bioprosthetic valve    History of mitral valve replacement with bioprosthetic valve    Hyperlipidemia    Hypertension    pt denies 05/30/13     Dr Alvan chester   Hyponatremia    Hypothyroidism    Rheumatic heart disease    Shortness of breath    Syncope and collapse February, 2024    Past Surgical History:  Procedure Laterality Date   AORTIC VALVE REPLACEMENT N/A 07/18/2013   Procedure: AORTIC VALVE REPLACEMENT (AVR);  Surgeon: Dorise MARLA Fellers, MD;  Location: [facility] OR;  Service: Open Heart Surgery;  Laterality: N/A;   BACK SURGERY     BREAST LUMPECTOMY Right 10/03/1993   CARDIAC CATHETERIZATION  5+ years   CARDIOVERSION N/A 07/26/2013   Procedure: CARDIOVERSION;  Surgeon: Vina LULLA Gull, MD;  Location: [facility] ENDOSCOPY;  Service: Cardiovascular;  Laterality: N/A;   CARDIOVERSION N/A 01/04/2023   Procedure: CARDIOVERSION;  Surgeon: Hobart Powell BRAVO, MD;  Location: [facility] ENDOSCOPY;  Service: Cardiovascular;  Laterality: N/A;    CARDIOVERSION N/A 02/23/2024   Procedure: CARDIOVERSION;  Surgeon: [name], MD;  Location: [facility] ORS;  Service: Endoscopy;  Laterality: N/A;   COLONOSCOPY  10/03/2010   Negative screening procedure   DILATION AND CURETTAGE OF UTERUS     ESOPHAGEAL MANOMETRY N/A 06/17/2013   Procedure: ESOPHAGEAL MANOMETRY (EM);  Surgeon: Alm JONELLE Gander, MD;  Location: [facility] ENDOSCOPY;  Service: Endoscopy;  Laterality: N/A;   INTRAOPERATIVE TRANSESOPHAGEAL ECHOCARDIOGRAM N/A 07/18/2013   Procedure: INTRAOPERATIVE TRANSESOPHAGEAL ECHOCARDIOGRAM;  Surgeon: Dorise MARLA Fellers, MD;  Location: [facility] OR;  Service: Open Heart Surgery;  Laterality: N/A;   LEFT AND RIGHT HEART CATHETERIZATION WITH CORONARY ANGIOGRAM N/A 07/10/2013   Procedure: LEFT AND RIGHT HEART CATHETERIZATION WITH CORONARY ANGIOGRAM;  Surgeon: Lonni JONETTA Cash, MD;  Location: [facility] CATH LAB;  Service: Cardiovascular;  Laterality: N/A;   LOOP RECORDER INSERTION N/A 07/02/2024   Procedure: LOOP RECORDER INSERTION;  Surgeon: Nancey Eulas BRAVO, MD;  Location: [facility] INVASIVE CV LAB;  Service: Cardiovascular;  Laterality: N/A;   LUMBAR LAMINECTOMY/DECOMPRESSION MICRODISCECTOMY Right 05/13/2014   Procedure: LUMBAR LAMINECTOMY/DECOMPRESSION MICRODISCECTOMY 1 LEVEL  lumbar four/five;  Surgeon: Darina MALVA Boehringer, MD;  Location: [facility] NEURO ORS;  Service: Neurosurgery;  Laterality: Right;   MITRAL VALVE REPLACEMENT N/A 07/18/2013   Procedure: MITRAL VALVE (MV) REPLACEMENT;  Surgeon: Dorise MARLA Fellers, MD;  Location: [facility] OR;  Service: Open Heart Surgery;  Laterality: N/A;   MITRAL VALVE SURGERY  10/03/1960   [facility], closed mitral valvulotomy by finger fracture   TEE WITHOUT CARDIOVERSION  01/24/2012   Procedure: TRANSESOPHAGEAL ECHOCARDIOGRAM (TEE);  Surgeon: Redell GORMAN Shallow, MD;  Location: [facility] ENDOSCOPY;  Service: Cardiovascular;  Laterality: N/A;   TEE WITHOUT CARDIOVERSION N/A 07/11/2013   Procedure: TRANSESOPHAGEAL ECHOCARDIOGRAM (TEE);  Surgeon: Aleene JINNY Passe,  MD;  Location: [facility] ENDOSCOPY;  Service: Cardiovascular;  Laterality: N/A;   TEE WITHOUT CARDIOVERSION N/A 07/26/2013   Procedure: TRANSESOPHAGEAL ECHOCARDIOGRAM (TEE);  Surgeon: Vina LULLA Gull, MD;  Location: [facility] ENDOSCOPY;  Service: Cardiovascular;  Laterality: N/A;   TUBAL LIGATION  10/04/1971       Inpatient Medications: Scheduled Meds:  amiodarone   200 mg Oral Daily   apixaban   5 mg Oral BID   atorvastatin   40 mg Oral Daily   budesonide -glycopyrrolate -formoterol   2 puff Inhalation BID   cefadroxil   500 mg Oral BID   feeding supplement  237 mL Oral BID BM   furosemide   80 mg Intravenous BID   HYDROcodone  bit-homatropine  5 mL Oral QHS   insulin  aspart  0-5 Units Subcutaneous QHS   insulin  aspart  0-9 Units Subcutaneous TID WC   ipratropium-albuterol   3 mL Nebulization TID   levothyroxine   100 mcg Oral QAC breakfast   lidocaine  HCl (PF)  10 mL Intradermal Once   pantoprazole   40 mg Oral QAC breakfast   Continuous Infusions:  PRN Meds: acetaminophen , azelastine , guaiFENesin -dextromethorphan , meclizine , melatonin, polyethylene glycol, prochlorperazine   Allergies:   Allergies[1]  Social History:   Social History   Socioeconomic History  Marital status: Married    Spouse name: Not on file   Number of children: 1   Years of education: Not on file   Highest education level: Not on file  Occupational History   Occupation: Retired Magazine Features Editor: RETIRED  Tobacco Use   Smoking status: Never   Smokeless tobacco: Never  Vaping Use   Vaping status: Never Used  Substance and Sexual Activity   Alcohol use: No    Alcohol/week: 0.0 standard drinks of alcohol   Drug use: No   Sexual activity: Not Currently  Other Topics Concern   Not on file  Social History Narrative   Married with children   No regular exercise   Social Drivers of Health   Tobacco Use: Low Risk (10/08/2024)   Patient History    Smoking Tobacco Use: Never    Smokeless Tobacco Use: Never    Passive  Exposure: Not on file  Financial Resource Strain: Not on file  Food Insecurity: No Food Insecurity (10/09/2024)   Epic    Worried About Programme Researcher, Broadcasting/film/video in the Last Year: Never true    Ran Out of Food in the Last Year: Never true  Transportation Needs: No Transportation Needs (10/09/2024)   Epic    Lack of Transportation (Medical): No    Lack of Transportation (Non-Medical): No  Physical Activity: Not on file  Stress: Not on file  Social Connections: Moderately Isolated (10/09/2024)   Social Connection and Isolation Panel    Frequency of Communication with Friends and Family: Once a week    Frequency of Social Gatherings with Friends and Family: Once a week    Attends Religious Services: 1 to 4 times per year    Active Member of [employer] or Organizations: No    Attends Banker Meetings: Never    Marital Status: Married  Catering Manager Violence: Not At Risk (10/09/2024)   Epic    Fear of Current or Ex-Partner: No    Emotionally Abused: No    Physically Abused: No    Sexually Abused: No  Depression (PHQ2-9): Not on file  Alcohol Screen: Not on file  Housing: Low Risk (10/09/2024)   Epic    Unable to Pay for Housing in the Last Year: No    Number of Times Moved in the Last Year: 0    Homeless in the Last Year: No  Utilities: Not At Risk (10/09/2024)   Epic    Threatened with loss of utilities: No  Health Literacy: Not on file    Family History:    Family History  Problem Relation Age of Onset   Heart disease Brother 12       MI   Rheum arthritis Maternal Grandmother    Asthma Maternal Grandfather    Hypothyroidism Mother    Diabetes Sister    Cirrhosis Father      ROS:  Please see the history of present illness.  ROS  All other ROS reviewed and negative.     Physical Exam/Data:   Vitals:   10/16/24 0815 10/16/24 0920 10/16/24 0922 10/16/24 1219  BP:   114/64 124/63  Pulse:   89 80  Resp:   17   Temp:   98.2 F (36.8 C) 98.4 F (36.9 C)  TempSrc:     Oral  SpO2: 92% 91% 98% 97%  Weight:      Height:        Intake/Output Summary (Last 24 hours) at 10/16/2024 1259  Last data filed at 10/16/2024 1220 Gross per 24 hour  Intake 240 ml  Output 1450 ml  Net -1210 ml   Filed Weights   10/14/24 0419 10/15/24 0321 10/16/24 0438  Weight: 68.5 kg 67.6 kg 65.7 kg   Body mass index is 26.49 kg/m.  General:  Well nourished, well developed, in no acute distress HEENT: normal Lymph: no adenopathy Neck: Patient drowsy, unable to examine Endocrine:  No thryomegaly Vascular: No carotid bruits; FA pulses 2+ bilaterally without bruits  Cardiac:  normal S1, S2; RRR; no murmur  Lungs:  clear to auscultation bilaterally, no wheezing, rhonchi or rales  Abd: soft, nontender, no hepatomegaly  Ext: no edema Musculoskeletal:  No deformities, BUE and BLE strength normal and equal Skin: warm and dry  Neuro:  CNs 2-12 intact, no focal abnormalities noted Psych:  Normal affect    Laboratory Data:  Chemistry Recent Labs  Lab 10/14/24 0446 10/15/24 0529 10/16/24 0517  NA 134* 137 136  K 3.2* 3.0* 3.7  CL 92* 95* 94*  CO2 33* 36* 29  GLUCOSE 125* 93 148*  BUN 24* 21 18  CREATININE 1.09* 0.95 1.15*  CALCIUM  8.5* 8.1* 8.5*  GFRNONAA 51* >60 48*  ANIONGAP 9 5 12     Recent Labs  Lab 10/16/24 0517  PROT 6.6  ALBUMIN  3.5  AST 35  ALT 43  ALKPHOS 73  BILITOT 1.0   Hematology Recent Labs  Lab 10/13/24 0545  WBC 8.3  RBC 3.88  HGB 10.4*  HCT 33.2*  MCV 85.6  MCH 26.8  MCHC 31.3  RDW 16.5*  PLT 128*   Cardiac EnzymesNo results for input(s): TROPONINI in the last 168 hours. No results for input(s): TROPIPOC in the last 168 hours.  BNPNo results for input(s): BNP, PROBNP in the last 168 hours.  DDimer No results for input(s): DDIMER in the last 168 hours.  Radiology/Studies:  US  CHEST (PLEURAL EFFUSION) Result Date: 10/16/2024 INDICATION: 79 year old female. Endorsing shortness of breath. Found to have a small  right-sided pleural effusion. Request is for therapeutic and diagnostic thoracentesis. EXAM: CHEST ULTRASOUND COMPARISON:  CT chest dated October 10, 2024 and chest x-ray dated October 15, 2024 FINDINGS: Small right pleural effusion. No significant pocket of fluid or percutaneous window to allow safe thoracentesis. Patient uncooperative and unable to follow commands. Risks outweigh the benefits. IMPRESSION: Small left-sided pleural effusion with no safe window for percutaneous access. Couple of patient's inability mass the procedure was not performed due to safety concerns. Read by: Delon Beagle, NP Electronically Signed   By: Ester Sides M.D.   On: 10/16/2024 10:15   ECHOCARDIOGRAM LIMITED Result Date: 10/15/2024    ECHOCARDIOGRAM LIMITED REPORT   Patient Name:   [name] Date of Exam: 10/15/2024 Medical Rec #:  [id number]       Height:       62.0 in Accession #:    [id number]      Weight:       149.0 lb Date of Birth:  02-Aug-1946       BSA:          1.687 m Patient Age:    78 years        BP:           140/68 mmHg Patient Gender: F               HR:           78 bpm. Exam Location:  [facility] Procedure: Limited  Echo, Cardiac Doppler and Color Doppler (Both Spectral and            Color Flow Doppler were utilized during procedure). Indications:    CHF I50.31  History:        Patient has prior history of Echocardiogram examinations, most                 recent 07/22/2024. CHF, Stroke and COPD, Arrythmias:Atrial                 Fibrillation; Risk Factors:Hypertension, Diabetes and                 Dyslipidemia.                 Aortic Valve: 21 mm Magna bioprosthetic valve is present in the                 aortic position. Procedure Date: 07/18/2013.                 Mitral Valve: 25 mm Manga Ease bioprosthetic valve valve is                 present in the mitral position. Procedure Date: 07/18/13.  Sonographer:    Tinnie Gosling RDCS Referring Phys: [phone] DAVID TAT IMPRESSIONS  1. Left ventricular  ejection fraction, by estimation, is 45%. The left ventricle has mildly decreased function. Left ventricular endocardial border not optimally defined to evaluate regional wall motion. Left ventricular diastolic function could not be evaluated. There is the interventricular septum is flattened in systole and diastole, consistent with right ventricular pressure and volume overload.  2. Right ventricular systolic function is severely reduced. The right ventricular size is moderately enlarged. There is moderately elevated pulmonary artery systolic pressure. The estimated right ventricular systolic pressure is 54.8 mmHg.  3. The inferior vena cava is normal in size with <50% respiratory variability, suggesting right atrial pressure of 8 mmHg. Comparison(s): Changes from prior study are noted. LV and RV function worsened. FINDINGS  Left Ventricle: Left ventricular ejection fraction, by estimation, is 45%. The left ventricle has mildly decreased function. Left ventricular endocardial border not optimally defined to evaluate regional wall motion. The interventricular septum is flattened in systole and diastole, consistent with right ventricular pressure and volume overload. Left ventricular diastolic function could not be evaluated. Right Ventricle: The right ventricular size is moderately enlarged. No increase in right ventricular wall thickness. Right ventricular systolic function is severely reduced. There is moderately elevated pulmonary artery systolic pressure. The tricuspid regurgitant velocity is 3.42 m/s, and with an assumed right atrial pressure of 8 mmHg, the estimated right ventricular systolic pressure is 54.8 mmHg. Mitral Valve: The mitral valve has been repaired/replaced. There is a 25 mm Manga Ease bioprosthetic valve present in the mitral position. Procedure Date: 07/18/13. Tricuspid Valve: The tricuspid valve is normal in structure. Tricuspid valve regurgitation is mild. Aortic Valve: The aortic valve has  been repaired/replaced. There is a 21 mm Magna bioprosthetic valve present in the aortic position. Procedure Date: 07/18/2013. Venous: The inferior vena cava is normal in size with less than 50% respiratory variability, suggesting right atrial pressure of 8 mmHg. LEFT VENTRICLE PLAX 2D LVIDd:         3.50 cm LVIDs:         2.70 cm LV PW:         1.10 cm LV IVS:        0.90 cm  IVC IVC diam: 1.70 cm  RIGHT ATRIUM           Index RA Area:     17.00 cm RA Volume:   45.40 ml  26.91 ml/m  TRICUSPID VALVE TR Peak grad:   46.8 mmHg TR Vmax:        342.00 cm/s Ivin Rosenbloom Priya Dezzie Badilla Electronically signed by Diannah Late Lorieann Argueta Signature Date/Time: 10/15/2024/4:35:11 PM    Final    CUP PACEART REMOTE DEVICE CHECK Result Date: 10/15/2024 ILR summary report received. Battery status OK. Normal device function. No new symptom, tachy, brady, or pause episodes. No new AF episodes. Monthly summary reports and ROV/PRN LA, CVRS  DG CHEST PORT 1 VIEW Result Date: 10/15/2024 EXAM: 1 VIEW XRAY OF THE CHEST 10/15/2024 11:52:00 AM COMPARISON: 10/08/2024 CLINICAL HISTORY: The patient has acute on chronic heart failure with preserved ejection fraction (HFpEF) and reports dyspnea. ICD10: I50.33 - Acute on chronic diastolic (congestive) heart failure; R06.00 - Dyspnea, unspecified. FINDINGS: LINES, TUBES AND DEVICES: Prosthetic cardiac valves noted. Left chest loop recorder or leadless pacemaker noted. LUNGS AND PLEURA: Increasing small right pleural effusion. Stable mild pulmonary edema. Chronic bilateral interstitial opacities. No pneumothorax. HEART AND MEDIASTINUM: Stable cardiomegaly and mediastinal contours. Prosthetic cardiac valves noted. Left chest loop recorder or leadless pacemaker noted. BONES AND SOFT TISSUES: Stable right chest wall surgical clips. Median sternotomy wires noted. No acute osseous abnormality. IMPRESSION: 1. Stable mild pulmonary edema and chronic bilateral interstitial opacities. 2. Increasing small  right pleural effusion. Electronically signed by: Waddell Calk MD MD 10/15/2024 12:16 PM EST RP Workstation: [id number]    Assessment and Plan:    Acute hypoxic respiratory failure Acute systolic and diastolic diastolic heart failure Biventricular heart failure S/p bioprosthetic/bovine AVR and MVR in 2014 - Presented with worsening DOE, orthopnea and bilateral leg swelling x few weeks. - proBNP elevated, 5574. Hs troponins mildly elevated, 42>>41. - 850 mL urine output in the last 24 hours with net -0.7 L on IV Lasix  80 mg twice daily. Serum creatinine stable, 1.15 today, 0.95 yesterday. - Patient drowsy, resting.  Unable to assess. - Limited echocardiogram this admission showed LVEF 45% and severe RV systolic dysfunction with moderate RV enlargement.  Moderate pulmonary hypertension was noted.  These findings are new compared to echocardiogram from October 2025.  She also has prosthetic aortic and mitral valves that are not assessed this admission.  Prior echo from October 2025 showed increased gradients across the aortic prosthetic valve.  Mitral prostatic valve remains normal. - She will need CT cardiac TAVR protocol to further evaluate prosthetic aortic valve.  If prosthetic aortic valve is normal, she will need LHC and RHC.  But prior to considering CT cardiac TAVR protocol, she will need VQ scan to rule out pulm embolism due to new severe RV dysfunction.  She is already on Eliquis , PE low in the differential however will still need to rule out Eliquis  failure. - Currently on IV Lasix  80 mg twice daily, will continue.  Check BMP in the evening.  If serum creatinine is uptrending, will need to back off on the Lasix .  Paroxysmal atrial fibrillation - Continue amiodarone  200 mg once daily and Eliquis  5 mg twice daily.  EKG on admission showed NSR with first-degree AV block, prolonged PR interval, 281 ms.  Will need to assess for symptoms of dizziness after she is more alert.   70 minutes  spent in reviewing prior medical records, reports, more than 3 labs, discussion and documentation.  For questions or updates, please contact [facility]  Please consult [email or web address] for contact info under Cardiology/STEMI.   Signed, Vina Byrd Priya Aylah Yeary, MD 10/16/2024 12:59 PM      [1] No Known Allergies  "

## 2024-10-16 NOTE — Progress Notes (Signed)
 " PROGRESS NOTE    [name]  FMW:[id number] DOB: 01/25/1946 DOA: 10/08/2024 PCP: Sheryle Carwin, MD   Brief Narrative:   79 y.o. female with medical history significant for paroxysmal A-fib on Eliquis , chronic HFpEF, prior CVA, carotid artery stenosis, valvular heart disease, CKD 3B, hypertension, hyperlipidemia, type 2 diabetes, hypothyroidism, COPD, bioprosthetic aortic and mitral valve 07/18/2013 presenting with shortness of breath that began on the evening of 10/09/2023.  She has noted some lower extremity edema.  She was admitted with acute hypoxemic respiratory failure in the setting of acute on chronic HFpEF with new severe RV dysfunction.  She is currently being diuresed with IV Lasix  and cardiology is now following with recommendations for VQ scan and possible need for heart catheterization during this admission.  She is also noted to have E. coli UTI and was started on cefadroxil  with sensitivity noted.   Assessment & Plan:   Principal Problem:   Acute on chronic diastolic (congestive) heart failure (HCC) Active Problems:   Upper airway cough syndrome in pt with mild cough variant asthma    PAF (paroxysmal atrial fibrillation) (HCC)   Long term current use of amiodarone    Long term (current) use of anticoagulants  Assessment and Plan:  Acute respiratory failure with hypoxia - Multifactorial including COPD exacerbation and CHF - Stable on 2 L nasal cannula>>96% - Wean oxygen  to room air for saturation greater 92% - 1/8 CT chest--new trace L-pleural eff and small loculated R-effusion; bilateral GGO - 1/13 CXR--slight increase R-pleural effusion.  Chronic interstitial prominence   Acute systolic and diastolic heart failure with biventricular heart failure - LVEF 45% with severe RV systolic dysfunction noted -Continue IV Lasix  80 mg twice daily and follow BMP -VQ scan per cardiology and possible LHC and RHC per cardiology   E coli UTI - Sensitive to cefadroxil  -started  cefadroxil  D #4 of 5   CKD stage IIIb - Baseline creatinine 1.1-1.4 - Monitor with diuresis   Paroxysmal atrial fibrillation - Continue apixaban  - continue amiodarone  - currently in sinus   Elevated troponin - Troponin trend is flat, not consistent with ACS - Troponin 42>>41   Mixed hyperlipidemia - Continue statin   Diabetes mellitus type 2 - NovoLog  sliding scale - 09/12/2024 hemoglobin A1c 6.3 -  elevated CBGs secondary to steroids - novolog  sliding scale   Elevated D-dimer - Obtain VQ scan as recommended per cardiology -pt compliant with apixaban    Social -spouse has decided he cannot take care of pt at home, ok with pt going to SNF -request repeat PT eval>>SNF    DVT prophylaxis: Apixaban  Code Status: DNR Family Communication: Husband at bedside 1/14 Disposition Plan:  Status is: Inpatient Remains inpatient appropriate because: Need for ongoing IV medications.   Consultants:  Cardiology  Procedures:  None  Antimicrobials:  Anti-infectives (From admission, onward)    Start     Dose/Rate Route Frequency Ordered Stop   10/13/24 1315  cefadroxil  (DURICEF) capsule 500 mg        500 mg Oral 2 times daily 10/13/24 1226         Subjective: Patient seen and evaluated today and is noted to be somnolent with no acute overnight events noted.  Objective: Vitals:   10/16/24 0815 10/16/24 0920 10/16/24 0922 10/16/24 1219  BP:   114/64 124/63  Pulse:   89 80  Resp:   17   Temp:   98.2 F (36.8 C) 98.4 F (36.9 C)  TempSrc:    Oral  SpO2: 92% 91% 98% 97%  Weight:      Height:        Intake/Output Summary (Last 24 hours) at 10/16/2024 1435 Last data filed at 10/16/2024 1220 Gross per 24 hour  Intake 240 ml  Output 1450 ml  Net -1210 ml   Filed Weights   10/14/24 0419 10/15/24 0321 10/16/24 0438  Weight: 68.5 kg 67.6 kg 65.7 kg    Examination:  General exam: Appears calm and comfortable, somnolent Respiratory system: Clear to auscultation.  Respiratory effort normal.  2 L nasal cannula Cardiovascular system: S1 & S2 heard, RRR.  Gastrointestinal system: Abdomen is soft Central nervous system: Somnolent Extremities: No edema Skin: No significant lesions noted Psychiatry: Flat affect.    Data Reviewed: I have personally reviewed following labs and imaging studies  CBC: Recent Labs  Lab 10/13/24 0545  WBC 8.3  HGB 10.4*  HCT 33.2*  MCV 85.6  PLT 128*   Basic Metabolic Panel: Recent Labs  Lab 10/11/24 0438 10/12/24 0546 10/13/24 0545 10/14/24 0446 10/15/24 0529 10/16/24 0517  NA 129* 128* 132* 134* 137 136  K 4.8 4.4 3.8 3.2* 3.0* 3.7  CL 88* 88* 92* 92* 95* 94*  CO2 20* 31 33* 33* 36* 29  GLUCOSE 253* 129* 132* 125* 93 148*  BUN 23 28* 29* 24* 21 18  CREATININE 1.38* 1.37* 1.32* 1.09* 0.95 1.15*  CALCIUM  8.6* 8.8* 8.5* 8.5* 8.1* 8.5*  MG 2.1 2.4  --  2.0 1.8 1.7   GFR: Estimated Creatinine Clearance: 34.7 mL/min (A) (by C-G formula based on SCr of 1.15 mg/dL (H)). Liver Function Tests: Recent Labs  Lab 10/16/24 0517  AST 35  ALT 43  ALKPHOS 73  BILITOT 1.0  PROT 6.6  ALBUMIN  3.5   No results for input(s): LIPASE, AMYLASE in the last 168 hours. No results for input(s): AMMONIA in the last 168 hours. Coagulation Profile: No results for input(s): INR, PROTIME in the last 168 hours. Cardiac Enzymes: No results for input(s): CKTOTAL, CKMB, CKMBINDEX, TROPONINI in the last 168 hours. BNP (last 3 results) Recent Labs    07/24/24 0438 09/11/24 1824 10/08/24 2147  PROBNP 2,155.0* 5,666.0* 5,574.0*   HbA1C: No results for input(s): HGBA1C in the last 72 hours. CBG: Recent Labs  Lab 10/15/24 1104 10/15/24 1631 10/15/24 2116 10/16/24 0741 10/16/24 1113  GLUCAP 211* 114* 178* 276* 142*   Lipid Profile: No results for input(s): CHOL, HDL, LDLCALC, TRIG, CHOLHDL, LDLDIRECT in the last 72 hours. Thyroid  Function Tests: No results for input(s): TSH,  T4TOTAL, FREET4, T3FREE, THYROIDAB in the last 72 hours. Anemia Panel: No results for input(s): VITAMINB12, FOLATE, FERRITIN, TIBC, IRON, RETICCTPCT in the last 72 hours. Sepsis Labs: No results for input(s): PROCALCITON, LATICACIDVEN in the last 168 hours.  Recent Results (from the past 240 hours)  Resp panel by RT-PCR (RSV, Flu A&B, Covid) Anterior Nasal Swab     Status: None   Collection Time: 10/09/24 12:05 PM   Specimen: Anterior Nasal Swab  Result Value Ref Range Status   SARS Coronavirus 2 by RT PCR NEGATIVE NEGATIVE Final    Comment: (NOTE) SARS-CoV-2 target nucleic acids are NOT DETECTED.  The SARS-CoV-2 RNA is generally detectable in upper respiratory specimens during the acute phase of infection. The lowest concentration of SARS-CoV-2 viral copies this assay can detect is 138 copies/mL. A negative result does not preclude SARS-Cov-2 infection and should not be used as the sole basis for treatment or other patient management decisions. A negative  result may occur with  improper specimen collection/handling, submission of specimen other than nasopharyngeal swab, presence of viral mutation(s) within the areas targeted by this assay, and inadequate number of viral copies(<138 copies/mL). A negative result must be combined with clinical observations, patient history, and epidemiological information. The expected result is Negative.  Fact Sheet for Patients:  bloggercourse.com  Fact Sheet for Healthcare Providers:  seriousbroker.it  This test is no t yet approved or cleared by the [place]  FDA and  has been authorized for detection and/or diagnosis of SARS-CoV-2 by FDA under an Emergency Use Authorization (EUA). This EUA will remain  in effect (meaning this test can be used) for the duration of the COVID-19 declaration under Section 564(b)(1) of the Act, 21 U.S.C.section 360bbb-3(b)(1), unless  the authorization is terminated  or revoked sooner.       Influenza A by PCR NEGATIVE NEGATIVE Final   Influenza B by PCR NEGATIVE NEGATIVE Final    Comment: (NOTE) The Xpert Xpress SARS-CoV-2/FLU/RSV plus assay is intended as an aid in the diagnosis of influenza from Nasopharyngeal swab specimens and should not be used as a sole basis for treatment. Nasal washings and aspirates are unacceptable for Xpert Xpress SARS-CoV-2/FLU/RSV testing.  Fact Sheet for Patients: bloggercourse.com  Fact Sheet for Healthcare Providers: seriousbroker.it  This test is not yet approved or cleared by the [place]  FDA and has been authorized for detection and/or diagnosis of SARS-CoV-2 by FDA under an Emergency Use Authorization (EUA). This EUA will remain in effect (meaning this test can be used) for the duration of the COVID-19 declaration under Section 564(b)(1) of the Act, 21 U.S.C. section 360bbb-3(b)(1), unless the authorization is terminated or revoked.     Resp Syncytial Virus by PCR NEGATIVE NEGATIVE Final    Comment: (NOTE) Fact Sheet for Patients: bloggercourse.com  Fact Sheet for Healthcare Providers: seriousbroker.it  This test is not yet approved or cleared by the [place]  FDA and has been authorized for detection and/or diagnosis of SARS-CoV-2 by FDA under an Emergency Use Authorization (EUA). This EUA will remain in effect (meaning this test can be used) for the duration of the COVID-19 declaration under Section 564(b)(1) of the Act, 21 U.S.C. section 360bbb-3(b)(1), unless the authorization is terminated or revoked.  Performed at [facility], [address] [place], [place] [address]   Respiratory (~20 pathogens) panel by PCR     Status: None   Collection Time: 10/09/24  1:15 PM   Specimen: Nasopharyngeal Swab; Respiratory  Result Value Ref Range Status    Adenovirus NOT DETECTED NOT DETECTED Final   Coronavirus 229E NOT DETECTED NOT DETECTED Final    Comment: (NOTE) The Coronavirus on the Respiratory Panel, DOES NOT test for the novel  Coronavirus (2019 nCoV)    Coronavirus HKU1 NOT DETECTED NOT DETECTED Final   Coronavirus NL63 NOT DETECTED NOT DETECTED Final   Coronavirus OC43 NOT DETECTED NOT DETECTED Final   Metapneumovirus NOT DETECTED NOT DETECTED Final   Rhinovirus / Enterovirus NOT DETECTED NOT DETECTED Final   Influenza A NOT DETECTED NOT DETECTED Final   Influenza B NOT DETECTED NOT DETECTED Final   Parainfluenza Virus 1 NOT DETECTED NOT DETECTED Final   Parainfluenza Virus 2 NOT DETECTED NOT DETECTED Final   Parainfluenza Virus 3 NOT DETECTED NOT DETECTED Final   Parainfluenza Virus 4 NOT DETECTED NOT DETECTED Final   Respiratory Syncytial Virus NOT DETECTED NOT DETECTED Final   Bordetella pertussis NOT DETECTED NOT DETECTED Final   Bordetella Parapertussis NOT  DETECTED NOT DETECTED Final   Chlamydophila pneumoniae NOT DETECTED NOT DETECTED Final   Mycoplasma pneumoniae NOT DETECTED NOT DETECTED Final    Comment: Performed at [facility], [address], [place], [place] [address]  Urine Culture (for pregnant, neutropenic or urologic patients or patients with an indwelling urinary catheter)     Status: Abnormal   Collection Time: 10/12/24  2:02 PM   Specimen: Urine, Catheterized  Result Value Ref Range Status   Specimen Description   Final    URINE, CATHETERIZED Performed at [facility], [address] [place], [place] [address]    Special Requests   Final    NONE Performed at [facility], [address] [place], [place] [address]    Culture 60,000 COLONIES/mL ESCHERICHIA COLI (A)  Final   Report Status 10/15/2024 FINAL  Final   Organism ID, Bacteria ESCHERICHIA COLI (A)  Final      Susceptibility   Escherichia coli - MIC*    AMPICILLIN  >=32 RESISTANT Resistant     CEFAZOLIN  (URINE) Value in next row  Sensitive      4 SENSITIVEThis is a modified FDA-approved test that has been validated and its performance characteristics determined by the reporting laboratory.  This laboratory is certified under the Clinical Laboratory Improvement Amendments CLIA as qualified to perform high complexity clinical laboratory testing.    CEFEPIME Value in next row Sensitive      4 SENSITIVEThis is a modified FDA-approved test that has been validated and its performance characteristics determined by the reporting laboratory.  This laboratory is certified under the Clinical Laboratory Improvement Amendments CLIA as qualified to perform high complexity clinical laboratory testing.    ERTAPENEM Value in next row Sensitive      4 SENSITIVEThis is a modified FDA-approved test that has been validated and its performance characteristics determined by the reporting laboratory.  This laboratory is certified under the Clinical Laboratory Improvement Amendments CLIA as qualified to perform high complexity clinical laboratory testing.    CEFTRIAXONE  Value in next row Sensitive      4 SENSITIVEThis is a modified FDA-approved test that has been validated and its performance characteristics determined by the reporting laboratory.  This laboratory is certified under the Clinical Laboratory Improvement Amendments CLIA as qualified to perform high complexity clinical laboratory testing.    CIPROFLOXACIN Value in next row Sensitive      4 SENSITIVEThis is a modified FDA-approved test that has been validated and its performance characteristics determined by the reporting laboratory.  This laboratory is certified under the Clinical Laboratory Improvement Amendments CLIA as qualified to perform high complexity clinical laboratory testing.    GENTAMICIN Value in next row Sensitive      4 SENSITIVEThis is a modified FDA-approved test that has been validated and its performance characteristics determined by the reporting laboratory.  This  laboratory is certified under the Clinical Laboratory Improvement Amendments CLIA as qualified to perform high complexity clinical laboratory testing.    NITROFURANTOIN  Value in next row Sensitive      4 SENSITIVEThis is a modified FDA-approved test that has been validated and its performance characteristics determined by the reporting laboratory.  This laboratory is certified under the Clinical Laboratory Improvement Amendments CLIA as qualified to perform high complexity clinical laboratory testing.    TRIMETH/SULFA Value in next row Sensitive      4 SENSITIVEThis is a modified FDA-approved test that has been validated and its performance characteristics determined by the reporting laboratory.  This laboratory  is certified under the Clinical Laboratory Improvement Amendments CLIA as qualified to perform high complexity clinical laboratory testing.    AMPICILLIN /SULBACTAM Value in next row Resistant      4 SENSITIVEThis is a modified FDA-approved test that has been validated and its performance characteristics determined by the reporting laboratory.  This laboratory is certified under the Clinical Laboratory Improvement Amendments CLIA as qualified to perform high complexity clinical laboratory testing.    PIP/TAZO Value in next row Sensitive      <=4 SENSITIVEThis is a modified FDA-approved test that has been validated and its performance characteristics determined by the reporting laboratory.  This laboratory is certified under the Clinical Laboratory Improvement Amendments CLIA as qualified to perform high complexity clinical laboratory testing.    MEROPENEM Value in next row Sensitive      <=4 SENSITIVEThis is a modified FDA-approved test that has been validated and its performance characteristics determined by the reporting laboratory.  This laboratory is certified under the Clinical Laboratory Improvement Amendments CLIA as qualified to perform high complexity clinical laboratory testing.    *  60,000 COLONIES/mL ESCHERICHIA COLI         Radiology Studies: NM Pulmonary Perfusion Result Date: 10/16/2024 EXAM: NM Lung Perfusion Scan. CLINICAL HISTORY: elevated d dimer Elevated D-dimer. TECHNIQUE: Radiolabeled MAA was administered intravenously and planar images of the lungs were obtained in multiple projections. RADIOPHARMACEUTICAL: 4.4 millicurie Technetium-80m albumin  aggregated (MAA) injection solution. COMPARISON: Paracentesis chest radiograph 10/15/2024. FINDINGS: PERFUSION: Perfusion defect in the right mid lung, related to fluid within the oblique fissure as seen on the prior chest radiograph. No wedge-shaped peripheral perfusion defect, suggesting no acute pulmonary embolism. Right pleural effusion. Cardiomegaly. IMPRESSION: 1. No acute pulmonary embolism. 2. Right pleural effusion and cardiomegaly. Electronically signed by: Norleen Boxer MD 10/16/2024 02:20 PM EST RP Workstation: [id number]   US  CHEST (PLEURAL EFFUSION) Result Date: 10/16/2024 INDICATION: 79 year old female. Endorsing shortness of breath. Found to have a small right-sided pleural effusion. Request is for therapeutic and diagnostic thoracentesis. EXAM: CHEST ULTRASOUND COMPARISON:  CT chest dated October 10, 2024 and chest x-ray dated October 15, 2024 FINDINGS: Small right pleural effusion. No significant pocket of fluid or percutaneous window to allow safe thoracentesis. Patient uncooperative and unable to follow commands. Risks outweigh the benefits. IMPRESSION: Small left-sided pleural effusion with no safe window for percutaneous access. Couple of patient's inability mass the procedure was not performed due to safety concerns. Read by: Delon Beagle, NP Electronically Signed   By: Ester Sides M.D.   On: 10/16/2024 10:15   ECHOCARDIOGRAM LIMITED Result Date: 10/15/2024    ECHOCARDIOGRAM LIMITED REPORT   Patient Name:   PATTE WINKEL Date of Exam: 10/15/2024 Medical Rec #:  [id number]       Height:       62.0 in  Accession #:    [id number]      Weight:       149.0 lb Date of Birth:  05-01-1946       BSA:          1.687 m Patient Age:    78 years        BP:           140/68 mmHg Patient Gender: F               HR:           78 bpm. Exam Location:  [facility] Procedure: Limited Echo, Cardiac Doppler and Color Doppler (Both Spectral and  Color Flow Doppler were utilized during procedure). Indications:    CHF I50.31  History:        Patient has prior history of Echocardiogram examinations, most                 recent 07/22/2024. CHF, Stroke and COPD, Arrythmias:Atrial                 Fibrillation; Risk Factors:Hypertension, Diabetes and                 Dyslipidemia.                 Aortic Valve: 21 mm Magna bioprosthetic valve is present in the                 aortic position. Procedure Date: 07/18/2013.                 Mitral Valve: 25 mm Manga Ease bioprosthetic valve valve is                 present in the mitral position. Procedure Date: 07/18/13.  Sonographer:    Tinnie Gosling RDCS Referring Phys: [phone] DAVID TAT IMPRESSIONS  1. Left ventricular ejection fraction, by estimation, is 45%. The left ventricle has mildly decreased function. Left ventricular endocardial border not optimally defined to evaluate regional wall motion. Left ventricular diastolic function could not be evaluated. There is the interventricular septum is flattened in systole and diastole, consistent with right ventricular pressure and volume overload.  2. Right ventricular systolic function is severely reduced. The right ventricular size is moderately enlarged. There is moderately elevated pulmonary artery systolic pressure. The estimated right ventricular systolic pressure is 54.8 mmHg.  3. The inferior vena cava is normal in size with <50% respiratory variability, suggesting right atrial pressure of 8 mmHg. Comparison(s): Changes from prior study are noted. LV and RV function worsened. FINDINGS  Left Ventricle: Left ventricular ejection  fraction, by estimation, is 45%. The left ventricle has mildly decreased function. Left ventricular endocardial border not optimally defined to evaluate regional wall motion. The interventricular septum is flattened in systole and diastole, consistent with right ventricular pressure and volume overload. Left ventricular diastolic function could not be evaluated. Right Ventricle: The right ventricular size is moderately enlarged. No increase in right ventricular wall thickness. Right ventricular systolic function is severely reduced. There is moderately elevated pulmonary artery systolic pressure. The tricuspid regurgitant velocity is 3.42 m/s, and with an assumed right atrial pressure of 8 mmHg, the estimated right ventricular systolic pressure is 54.8 mmHg. Mitral Valve: The mitral valve has been repaired/replaced. There is a 25 mm Manga Ease bioprosthetic valve present in the mitral position. Procedure Date: 07/18/13. Tricuspid Valve: The tricuspid valve is normal in structure. Tricuspid valve regurgitation is mild. Aortic Valve: The aortic valve has been repaired/replaced. There is a 21 mm Magna bioprosthetic valve present in the aortic position. Procedure Date: 07/18/2013. Venous: The inferior vena cava is normal in size with less than 50% respiratory variability, suggesting right atrial pressure of 8 mmHg. LEFT VENTRICLE PLAX 2D LVIDd:         3.50 cm LVIDs:         2.70 cm LV PW:         1.10 cm LV IVS:        0.90 cm  IVC IVC diam: 1.70 cm RIGHT ATRIUM           Index RA Area:     17.00 cm  RA Volume:   45.40 ml  26.91 ml/m  TRICUSPID VALVE TR Peak grad:   46.8 mmHg TR Vmax:        342.00 cm/s Vishnu Priya Mallipeddi Electronically signed by Diannah Late Mallipeddi Signature Date/Time: 10/15/2024/4:35:11 PM    Final    CUP PACEART REMOTE DEVICE CHECK Result Date: 10/15/2024 ILR summary report received. Battery status OK. Normal device function. No new symptom, tachy, brady, or pause episodes. No new AF  episodes. Monthly summary reports and ROV/PRN LA, CVRS  DG CHEST PORT 1 VIEW Result Date: 10/15/2024 EXAM: 1 VIEW XRAY OF THE CHEST 10/15/2024 11:52:00 AM COMPARISON: 10/08/2024 CLINICAL HISTORY: The patient has acute on chronic heart failure with preserved ejection fraction (HFpEF) and reports dyspnea. ICD10: I50.33 - Acute on chronic diastolic (congestive) heart failure; R06.00 - Dyspnea, unspecified. FINDINGS: LINES, TUBES AND DEVICES: Prosthetic cardiac valves noted. Left chest loop recorder or leadless pacemaker noted. LUNGS AND PLEURA: Increasing small right pleural effusion. Stable mild pulmonary edema. Chronic bilateral interstitial opacities. No pneumothorax. HEART AND MEDIASTINUM: Stable cardiomegaly and mediastinal contours. Prosthetic cardiac valves noted. Left chest loop recorder or leadless pacemaker noted. BONES AND SOFT TISSUES: Stable right chest wall surgical clips. Median sternotomy wires noted. No acute osseous abnormality. IMPRESSION: 1. Stable mild pulmonary edema and chronic bilateral interstitial opacities. 2. Increasing small right pleural effusion. Electronically signed by: Waddell Calk MD MD 10/15/2024 12:16 PM EST RP Workstation: [id number]        Scheduled Meds:  amiodarone   200 mg Oral Daily   apixaban   5 mg Oral BID   atorvastatin   40 mg Oral Daily   budesonide -glycopyrrolate -formoterol   2 puff Inhalation BID   cefadroxil   500 mg Oral BID   feeding supplement  237 mL Oral BID BM   furosemide   80 mg Intravenous BID   HYDROcodone  bit-homatropine  5 mL Oral QHS   insulin  aspart  0-5 Units Subcutaneous QHS   insulin  aspart  0-9 Units Subcutaneous TID WC   ipratropium-albuterol   3 mL Nebulization TID   levothyroxine   100 mcg Oral QAC breakfast   lidocaine  HCl (PF)  10 mL Intradermal Once   pantoprazole   40 mg Oral QAC breakfast     LOS: 8 days    Time spent: 55 minutes    Raquan Iannone JONETTA Fairly, DO [facility]  If 7PM-7AM, please contact  night-coverage [email or web address] 10/16/2024, 2:35 PM   "

## 2024-10-17 ENCOUNTER — Other Ambulatory Visit (HOSPITAL_COMMUNITY): Payer: Self-pay | Admitting: *Deleted

## 2024-10-17 ENCOUNTER — Inpatient Hospital Stay (HOSPITAL_COMMUNITY)

## 2024-10-17 DIAGNOSIS — J9601 Acute respiratory failure with hypoxia: Secondary | ICD-10-CM | POA: Diagnosis not present

## 2024-10-17 DIAGNOSIS — I5033 Acute on chronic diastolic (congestive) heart failure: Secondary | ICD-10-CM | POA: Diagnosis not present

## 2024-10-17 DIAGNOSIS — Z952 Presence of prosthetic heart valve: Secondary | ICD-10-CM

## 2024-10-17 DIAGNOSIS — I5041 Acute combined systolic (congestive) and diastolic (congestive) heart failure: Secondary | ICD-10-CM | POA: Diagnosis not present

## 2024-10-17 DIAGNOSIS — I480 Paroxysmal atrial fibrillation: Secondary | ICD-10-CM | POA: Diagnosis not present

## 2024-10-17 DIAGNOSIS — I5082 Biventricular heart failure: Secondary | ICD-10-CM | POA: Diagnosis not present

## 2024-10-17 LAB — ECHOCARDIOGRAM LIMITED
AR max vel: 0.47 cm2
AV Area VTI: 0.44 cm2
AV Area mean vel: 0.49 cm2
AV Mean grad: 21.3 mmHg
AV Peak grad: 37.6 mmHg
Ao pk vel: 3.07 m/s
Area-P 1/2: 1.14 cm2
Height: 62 in
MV VTI: 0.38 cm2
P 1/2 time: 175 ms
S' Lateral: 2.5 cm
Weight: 2286.4 [oz_av]

## 2024-10-17 LAB — BASIC METABOLIC PANEL WITH GFR
Anion gap: 14 (ref 5–15)
BUN: 14 mg/dL (ref 8–23)
CO2: 28 mmol/L (ref 22–32)
Calcium: 8.3 mg/dL — ABNORMAL LOW (ref 8.9–10.3)
Chloride: 94 mmol/L — ABNORMAL LOW (ref 98–111)
Creatinine, Ser: 1.04 mg/dL — ABNORMAL HIGH (ref 0.44–1.00)
GFR, Estimated: 54 mL/min — ABNORMAL LOW
Glucose, Bld: 106 mg/dL — ABNORMAL HIGH (ref 70–99)
Potassium: 3 mmol/L — ABNORMAL LOW (ref 3.5–5.1)
Sodium: 136 mmol/L (ref 135–145)

## 2024-10-17 LAB — CBC
HCT: 34.2 % — ABNORMAL LOW (ref 36.0–46.0)
Hemoglobin: 10.2 g/dL — ABNORMAL LOW (ref 12.0–15.0)
MCH: 26.1 pg (ref 26.0–34.0)
MCHC: 29.8 g/dL — ABNORMAL LOW (ref 30.0–36.0)
MCV: 87.5 fL (ref 80.0–100.0)
Platelets: 111 K/uL — ABNORMAL LOW (ref 150–400)
RBC: 3.91 MIL/uL (ref 3.87–5.11)
RDW: 16.7 % — ABNORMAL HIGH (ref 11.5–15.5)
WBC: 8.1 K/uL (ref 4.0–10.5)
nRBC: 0 % (ref 0.0–0.2)

## 2024-10-17 LAB — GLUCOSE, CAPILLARY
Glucose-Capillary: 147 mg/dL — ABNORMAL HIGH (ref 70–99)
Glucose-Capillary: 210 mg/dL — ABNORMAL HIGH (ref 70–99)
Glucose-Capillary: 129 mg/dL — ABNORMAL HIGH (ref 70–99)
Glucose-Capillary: 225 mg/dL — ABNORMAL HIGH (ref 70–99)

## 2024-10-17 LAB — MAGNESIUM: Magnesium: 1.7 mg/dL (ref 1.7–2.4)

## 2024-10-17 MED ORDER — APIXABAN 5 MG PO TABS
5.0000 mg | ORAL_TABLET | Freq: Once | ORAL | Status: AC
  Administered 2024-10-17: 5 mg via ORAL
  Filled 2024-10-17: qty 1

## 2024-10-17 MED ORDER — POTASSIUM CHLORIDE CRYS ER 20 MEQ PO TBCR
40.0000 meq | EXTENDED_RELEASE_TABLET | Freq: Two times a day (BID) | ORAL | Status: DC
  Administered 2024-10-17 – 2024-10-21 (×9): 40 meq via ORAL
  Filled 2024-10-17 (×9): qty 2

## 2024-10-17 MED ORDER — FUROSEMIDE 10 MG/ML IJ SOLN
40.0000 mg | Freq: Once | INTRAMUSCULAR | Status: AC
  Administered 2024-10-17: 40 mg via INTRAVENOUS
  Filled 2024-10-17: qty 4

## 2024-10-17 MED ORDER — HEPARIN (PORCINE) 25000 UT/250ML-% IV SOLN: 900.0000 [IU]/h | INTRAVENOUS | Status: DC

## 2024-10-17 MED ORDER — APIXABAN 5 MG PO TABS
5.0000 mg | ORAL_TABLET | Freq: Two times a day (BID) | ORAL | Status: DC
  Administered 2024-10-18 – 2024-10-21 (×8): 5 mg via ORAL
  Filled 2024-10-17 (×9): qty 1

## 2024-10-17 NOTE — Progress Notes (Addendum)
 PHARMACY - ANTICOAGULATION CONSULT NOTE  Pharmacy Consult for Heparin  Indication: atrial fibrillation  Allergies[1]  Patient Measurements: Height: 5' 2 (157.5 cm) Weight: 64.8 kg (142 lb 14.4 oz) IBW/kg (Calculated) : 50.1 HEPARIN  DW (KG): 64.6  Vital Signs: Temp: 97.7 F (36.5 C) (01/15 0500) Temp Source: Oral (01/15 0500) BP: 120/75 (01/15 0500) Pulse Rate: 69 (01/15 0500)  Labs: Recent Labs    10/15/24 0529 10/16/24 0517 10/17/24 0406  HGB  --   --  10.2*  HCT  --   --  34.2*  PLT  --   --  111*  CREATININE 0.95 1.15* 1.04*    Estimated Creatinine Clearance: 38.1 mL/min (A) (by C-G formula based on SCr of 1.04 mg/dL (H)).   Medical History: Past Medical History:  Diagnosis Date   Anemia    Asthma 2010   Atrial fibrillation (HCC)    Atrial flutter (HCC)    Breast carcinoma (HCC) 1995   1995   Cardioembolic stroke (HCC) 01/2012   Right frontal in 01/2012; normal carotid ultrasound; possible LAA thrombus by TEE; virtual complete neurologic recovery   Carotid artery stenosis 1961   Chronic kidney disease, stage 2, mildly decreased GFR    GFR of approximately 60   Diabetes mellitus without complication (HCC) 5+ years ago   Controlled most of time   Diverticulosis of colon (without mention of hemorrhage) 2012   Dr. Golda   Fasting hyperglycemia    120 fasting   Gastroesophageal reflux disease    Gastroparesis    Heart failure with improved ejection fraction (HFimpEF) (HCC) 5+ years   Heart murmur 1961   Hemorrhoids    History of aortic valve replacement with bioprosthetic valve    History of mitral valve replacement with bioprosthetic valve    Hyperlipidemia    Hypertension    pt denies 05/30/13     Dr Alvan chester   Hyponatremia    Hypothyroidism    Rheumatic heart disease    Shortness of breath    Syncope and collapse February, 2024    Medications:  Medications Prior to Admission  Medication Sig Dispense Refill Last Dose/Taking    acetaminophen  (TYLENOL ) 325 MG tablet Take 2 tablets (650 mg total) by mouth every 6 (six) hours as needed. 36 tablet 0 Unknown   albuterol  (VENTOLIN  HFA) 108 (90 Base) MCG/ACT inhaler Inhale 2 puffs into the lungs every 6 (six) hours as needed for wheezing or shortness of breath.   Unknown   amiodarone  (PACERONE ) 200 MG tablet Take 1 tablet (200 mg total) by mouth daily. 90 tablet 3 Past Week   apixaban  (ELIQUIS ) 5 MG TABS tablet Take 1 tablet (5 mg total) by mouth 2 (two) times daily. 60 tablet 1 Past Week   atorvastatin  (LIPITOR) 40 MG tablet Take 1 tablet (40 mg total) by mouth daily. 90 tablet 3 Past Week   azelastine  (ASTELIN ) 0.1 % nasal spray Place 1 spray into both nostrils 2 (two) times daily. Use in each nostril as directed (Patient taking differently: Place 1 spray into both nostrils daily as needed for allergies. Use in each nostril as directed) 30 mL 1 Past Week   escitalopram  (LEXAPRO ) 10 MG tablet Take 10 mg by mouth daily with breakfast.   Past Week   feeding supplement (ENSURE PLUS HIGH PROTEIN) LIQD Take 237 mLs by mouth 2 (two) times daily between meals. 14220 mL 0 Past Week   levothyroxine  (SYNTHROID ) 100 MCG tablet Take 100 mcg by mouth daily before breakfast.  Past Week   meclizine  (ANTIVERT ) 25 MG tablet Take 1 tablet (25 mg total) by mouth every 6 (six) hours as needed for dizziness. 90 tablet 1 Unknown   metFORMIN  (GLUCOPHAGE ) 500 MG tablet Take 1 tablet (500 mg total) by mouth 2 (two) times daily with a meal. 60 tablet 1 Past Week   Multiple Vitamin (MULTIVITAMIN WITH MINERALS) TABS tablet Take 1 tablet by mouth in the morning.   Unknown   ondansetron  (ZOFRAN -ODT) 4 MG disintegrating tablet Take 4 mg by mouth every 6 (six) hours as needed for nausea or vomiting.   Unknown   pantoprazole  (PROTONIX ) 40 MG tablet TAKE 1 TABLET 30 TO 60 MINUTES BEFORE FIRST MEAL OF THE DAY. (Patient taking differently: Take 40 mg by mouth daily before breakfast.) 90 tablet 2 Past Week    Polyethyl Glycol-Propyl Glycol (SYSTANE OP) Place 1 drop into both eyes daily as needed (dry eyes).   Unknown   potassium chloride  SA (KLOR-CON  M) 20 MEQ tablet Take 1 tablet (20 mEq total) by mouth daily. (Patient taking differently: Take 20 mEq by mouth in the morning.) 30 tablet 0 Past Week   torsemide  (DEMADEX ) 20 MG tablet Take 1 tablet (20 mg total) by mouth daily. 30 tablet 0 Past Week   TRELEGY ELLIPTA  100-62.5-25 MCG/ACT AEPB Inhale 1 puff into the lungs daily.   Past Week   benzonatate  (TESSALON ) 100 MG capsule Take 1 capsule (100 mg total) by mouth 3 (three) times daily as needed for cough. (Patient not taking: Reported on 10/14/2024) 20 capsule 0 Not Taking    Assessment: 79 y.o. female with medical history significant for paroxysmal A-fib on Eliquis , chronic HFpEF, prior CVA, carotid artery stenosis, valvular heart disease, CKD 3B, hypertension, hyperlipidemia, type 2 diabetes, hypothyroidism, COPD, bioprosthetic aortic and mitral valve 07/18/2013 presenting with shortness of breath. Possible LHC and RHC per cardiology . Last dose of eliquis  1/15 at 0930.  Pharmacy asked to start heparin . Will monitor aPTT until correlates with heparin  level.  Goal of Therapy:  Heparin  level 0.3-0.7 units/ml aPTT 66-102 seconds Monitor platelets by anticoagulation protocol: Yes   Plan:  Start heparin  infusion at 900 units/hr at ~2200 this evening Check aPTT and anti-Xa level in 8 hours and daily while on heparin  Continue to monitor H&H and platelets  Cherlyn Boers, BS Pharm D, BCPS Clinical Pharmacist 10/17/2024,12:04 PM  MD decided not to do catheterization tomorrow  Restart eliquis   Cherlyn Boers, BS Pharm D, BCPS Clinical Pharmacist      [1] No Known Allergies

## 2024-10-17 NOTE — Progress Notes (Signed)
 " PROGRESS NOTE    [name]  FMW:[id number] DOB: Dec 28, 1945 DOA: 10/08/2024 PCP: Sheryle Carwin, MD   Brief Narrative:   79 y.o. female with medical history significant for paroxysmal A-fib on Eliquis , chronic HFpEF, prior CVA, carotid artery stenosis, valvular heart disease, CKD 3B, hypertension, hyperlipidemia, type 2 diabetes, hypothyroidism, COPD, bioprosthetic aortic and mitral valve 07/18/2013 presenting with shortness of breath that began on the evening of 10/09/2023.  She has noted some lower extremity edema.  She was admitted with acute hypoxemic respiratory failure in the setting of acute on chronic HFpEF with new severe RV dysfunction.  She is currently being diuresed with IV Lasix  and cardiology is now following with recommendations for VQ scan and possible need for heart catheterization during this admission.  She is also noted to have E. coli UTI and was started on cefadroxil  with sensitivity noted.  Cardiology following with plans to give extra dose of Lasix  today and recheck echocardiogram for evaluation of valves.   Assessment & Plan:   Principal Problem:   Acute on chronic diastolic (congestive) heart failure (HCC) Active Problems:   Upper airway cough syndrome in pt with mild cough variant asthma    PAF (paroxysmal atrial fibrillation) (HCC)   Long term current use of amiodarone    Long term (current) use of anticoagulants  Assessment and Plan:  Acute respiratory failure with hypoxia - Multifactorial including COPD exacerbation and CHF - Stable on 2 L nasal cannula>>96% - Wean oxygen  to room air for saturation greater 92% - 1/8 CT chest--new trace L-pleural eff and small loculated R-effusion; bilateral GGO - 1/13 CXR--slight increase R-pleural effusion.  Chronic interstitial prominence   Acute systolic and diastolic heart failure with biventricular heart failure - LVEF 45% with severe RV systolic dysfunction noted -Continue IV Lasix  80 mg twice daily and follow BMP,  extra dose of IV Lasix  1/15 -VQ scan negative for PE, 2D echocardiogram pending per cardiology to reassess valves   E coli UTI - Sensitive to cefadroxil  -started cefadroxil  D #5 of 5   CKD stage IIIb - Baseline creatinine 1.1-1.4 - Monitor with diuresis   Paroxysmal atrial fibrillation - Continue apixaban  - continue amiodarone  - currently in sinus   Elevated troponin - Troponin trend is flat, not consistent with ACS - Troponin 42>>41   Mixed hyperlipidemia - Continue statin   Diabetes mellitus type 2 - NovoLog  sliding scale - 09/12/2024 hemoglobin A1c 6.3 -  elevated CBGs secondary to steroids - novolog  sliding scale   Elevated D-dimer - VQ scan negative for PE   Social -spouse has decided he cannot take care of pt at home, ok with pt going to SNF -request repeat PT eval>>SNF    DVT prophylaxis: Apixaban  Code Status: DNR Family Communication: Husband at bedside 1/15 Disposition Plan:  Status is: Inpatient Remains inpatient appropriate because: Need for ongoing IV medications.   Consultants:  Cardiology  Procedures:  None  Antimicrobials:  Anti-infectives (From admission, onward)    Start     Dose/Rate Route Frequency Ordered Stop   10/13/24 1315  cefadroxil  (DURICEF) capsule 500 mg        500 mg Oral 2 times daily 10/13/24 1226 10/17/24 2359       Subjective: Patient seen and evaluated today and is more alert and awake and having breakfast with husband at bedside.  No acute overnight events noted.  She appears not to have diuresed too well overnight.  Objective: Vitals:   10/16/24 2100 10/17/24 0259 10/17/24 0500 10/17/24  0750  BP: 120/61  120/75   Pulse: 76  69   Resp: 19     Temp: 98 F (36.7 C)  97.7 F (36.5 C)   TempSrc: Oral  Oral   SpO2: 96%  98% (!) 84%  Weight:  64.8 kg    Height:        Intake/Output Summary (Last 24 hours) at 10/17/2024 1248 Last data filed at 10/17/2024 0930 Gross per 24 hour  Intake 240 ml  Output --  Net  240 ml   Filed Weights   10/15/24 0321 10/16/24 0438 10/17/24 0259  Weight: 67.6 kg 65.7 kg 64.8 kg    Examination:  General exam: Appears calm and comfortable, awake/alert Respiratory system: Clear to auscultation. Respiratory effort normal.  2 L nasal cannula Cardiovascular system: S1 & S2 heard, RRR.  Gastrointestinal system: Abdomen is soft Central nervous system: Awake/alert Extremities: No edema Skin: No significant lesions noted Psychiatry: Flat affect.    Data Reviewed: I have personally reviewed following labs and imaging studies  CBC: Recent Labs  Lab 10/13/24 0545 10/17/24 0406  WBC 8.3 8.1  HGB 10.4* 10.2*  HCT 33.2* 34.2*  MCV 85.6 87.5  PLT 128* 111*   Basic Metabolic Panel: Recent Labs  Lab 10/12/24 0546 10/13/24 0545 10/14/24 0446 10/15/24 0529 10/16/24 0517 10/17/24 0406  NA 128* 132* 134* 137 136 136  K 4.4 3.8 3.2* 3.0* 3.7 3.0*  CL 88* 92* 92* 95* 94* 94*  CO2 31 33* 33* 36* 29 28  GLUCOSE 129* 132* 125* 93 148* 106*  BUN 28* 29* 24* 21 18 14   CREATININE 1.37* 1.32* 1.09* 0.95 1.15* 1.04*  CALCIUM  8.8* 8.5* 8.5* 8.1* 8.5* 8.3*  MG 2.4  --  2.0 1.8 1.7 1.7   GFR: Estimated Creatinine Clearance: 38.1 mL/min (A) (by C-G formula based on SCr of 1.04 mg/dL (H)). Liver Function Tests: Recent Labs  Lab 10/16/24 0517  AST 35  ALT 43  ALKPHOS 73  BILITOT 1.0  PROT 6.6  ALBUMIN  3.5   No results for input(s): LIPASE, AMYLASE in the last 168 hours. No results for input(s): AMMONIA in the last 168 hours. Coagulation Profile: No results for input(s): INR, PROTIME in the last 168 hours. Cardiac Enzymes: No results for input(s): CKTOTAL, CKMB, CKMBINDEX, TROPONINI in the last 168 hours. BNP (last 3 results) Recent Labs    07/24/24 0438 09/11/24 1824 10/08/24 2147  PROBNP 2,155.0* 5,666.0* 5,574.0*   HbA1C: No results for input(s): HGBA1C in the last 72 hours. CBG: Recent Labs  Lab 10/16/24 1113 10/16/24 1620  10/16/24 1947 10/17/24 0728 10/17/24 1122  GLUCAP 142* 169* 151* 147* 210*   Lipid Profile: No results for input(s): CHOL, HDL, LDLCALC, TRIG, CHOLHDL, LDLDIRECT in the last 72 hours. Thyroid  Function Tests: No results for input(s): TSH, T4TOTAL, FREET4, T3FREE, THYROIDAB in the last 72 hours. Anemia Panel: No results for input(s): VITAMINB12, FOLATE, FERRITIN, TIBC, IRON, RETICCTPCT in the last 72 hours. Sepsis Labs: No results for input(s): PROCALCITON, LATICACIDVEN in the last 168 hours.  Recent Results (from the past 240 hours)  Resp panel by RT-PCR (RSV, Flu A&B, Covid) Anterior Nasal Swab     Status: None   Collection Time: 10/09/24 12:05 PM   Specimen: Anterior Nasal Swab  Result Value Ref Range Status   SARS Coronavirus 2 by RT PCR NEGATIVE NEGATIVE Final    Comment: (NOTE) SARS-CoV-2 target nucleic acids are NOT DETECTED.  The SARS-CoV-2 RNA is generally detectable in upper respiratory specimens  during the acute phase of infection. The lowest concentration of SARS-CoV-2 viral copies this assay can detect is 138 copies/mL. A negative result does not preclude SARS-Cov-2 infection and should not be used as the sole basis for treatment or other patient management decisions. A negative result may occur with  improper specimen collection/handling, submission of specimen other than nasopharyngeal swab, presence of viral mutation(s) within the areas targeted by this assay, and inadequate number of viral copies(<138 copies/mL). A negative result must be combined with clinical observations, patient history, and epidemiological information. The expected result is Negative.  Fact Sheet for Patients:  bloggercourse.com  Fact Sheet for Healthcare Providers:  seriousbroker.it  This test is no t yet approved or cleared by the [place]  FDA and  has been authorized for detection and/or  diagnosis of SARS-CoV-2 by FDA under an Emergency Use Authorization (EUA). This EUA will remain  in effect (meaning this test can be used) for the duration of the COVID-19 declaration under Section 564(b)(1) of the Act, 21 U.S.C.section 360bbb-3(b)(1), unless the authorization is terminated  or revoked sooner.       Influenza A by PCR NEGATIVE NEGATIVE Final   Influenza B by PCR NEGATIVE NEGATIVE Final    Comment: (NOTE) The Xpert Xpress SARS-CoV-2/FLU/RSV plus assay is intended as an aid in the diagnosis of influenza from Nasopharyngeal swab specimens and should not be used as a sole basis for treatment. Nasal washings and aspirates are unacceptable for Xpert Xpress SARS-CoV-2/FLU/RSV testing.  Fact Sheet for Patients: bloggercourse.com  Fact Sheet for Healthcare Providers: seriousbroker.it  This test is not yet approved or cleared by the [place]  FDA and has been authorized for detection and/or diagnosis of SARS-CoV-2 by FDA under an Emergency Use Authorization (EUA). This EUA will remain in effect (meaning this test can be used) for the duration of the COVID-19 declaration under Section 564(b)(1) of the Act, 21 U.S.C. section 360bbb-3(b)(1), unless the authorization is terminated or revoked.     Resp Syncytial Virus by PCR NEGATIVE NEGATIVE Final    Comment: (NOTE) Fact Sheet for Patients: bloggercourse.com  Fact Sheet for Healthcare Providers: seriousbroker.it  This test is not yet approved or cleared by the [place]  FDA and has been authorized for detection and/or diagnosis of SARS-CoV-2 by FDA under an Emergency Use Authorization (EUA). This EUA will remain in effect (meaning this test can be used) for the duration of the COVID-19 declaration under Section 564(b)(1) of the Act, 21 U.S.C. section 360bbb-3(b)(1), unless the authorization is terminated  or revoked.  Performed at [facility], [address], [place], [place] [address]   Respiratory (~20 pathogens) panel by PCR     Status: None   Collection Time: 10/09/24  1:15 PM   Specimen: Nasopharyngeal Swab; Respiratory  Result Value Ref Range Status   Adenovirus NOT DETECTED NOT DETECTED Final   Coronavirus 229E NOT DETECTED NOT DETECTED Final    Comment: (NOTE) The Coronavirus on the Respiratory Panel, DOES NOT test for the novel  Coronavirus (2019 nCoV)    Coronavirus HKU1 NOT DETECTED NOT DETECTED Final   Coronavirus NL63 NOT DETECTED NOT DETECTED Final   Coronavirus OC43 NOT DETECTED NOT DETECTED Final   Metapneumovirus NOT DETECTED NOT DETECTED Final   Rhinovirus / Enterovirus NOT DETECTED NOT DETECTED Final   Influenza A NOT DETECTED NOT DETECTED Final   Influenza B NOT DETECTED NOT DETECTED Final   Parainfluenza Virus 1 NOT DETECTED NOT DETECTED Final   Parainfluenza Virus 2 NOT DETECTED NOT  DETECTED Final   Parainfluenza Virus 3 NOT DETECTED NOT DETECTED Final   Parainfluenza Virus 4 NOT DETECTED NOT DETECTED Final   Respiratory Syncytial Virus NOT DETECTED NOT DETECTED Final   Bordetella pertussis NOT DETECTED NOT DETECTED Final   Bordetella Parapertussis NOT DETECTED NOT DETECTED Final   Chlamydophila pneumoniae NOT DETECTED NOT DETECTED Final   Mycoplasma pneumoniae NOT DETECTED NOT DETECTED Final    Comment: Performed at [facility], [address] [place], [place] [address]  Urine Culture (for pregnant, neutropenic or urologic patients or patients with an indwelling urinary catheter)     Status: Abnormal   Collection Time: 10/12/24  2:02 PM   Specimen: Urine, Catheterized  Result Value Ref Range Status   Specimen Description   Final    URINE, CATHETERIZED Performed at [facility], [address], [place], [place] [address]    Special Requests   Final    NONE Performed at [facility], [address] [place], [place] [address]    Culture 60,000  COLONIES/mL ESCHERICHIA COLI (A)  Final   Report Status 10/15/2024 FINAL  Final   Organism ID, Bacteria ESCHERICHIA COLI (A)  Final      Susceptibility   Escherichia coli - MIC*    AMPICILLIN  >=32 RESISTANT Resistant     CEFAZOLIN  (URINE) Value in next row Sensitive      4 SENSITIVEThis is a modified FDA-approved test that has been validated and its performance characteristics determined by the reporting laboratory.  This laboratory is certified under the Clinical Laboratory Improvement Amendments CLIA as qualified to perform high complexity clinical laboratory testing.    CEFEPIME Value in next row Sensitive      4 SENSITIVEThis is a modified FDA-approved test that has been validated and its performance characteristics determined by the reporting laboratory.  This laboratory is certified under the Clinical Laboratory Improvement Amendments CLIA as qualified to perform high complexity clinical laboratory testing.    ERTAPENEM Value in next row Sensitive      4 SENSITIVEThis is a modified FDA-approved test that has been validated and its performance characteristics determined by the reporting laboratory.  This laboratory is certified under the Clinical Laboratory Improvement Amendments CLIA as qualified to perform high complexity clinical laboratory testing.    CEFTRIAXONE  Value in next row Sensitive      4 SENSITIVEThis is a modified FDA-approved test that has been validated and its performance characteristics determined by the reporting laboratory.  This laboratory is certified under the Clinical Laboratory Improvement Amendments CLIA as qualified to perform high complexity clinical laboratory testing.    CIPROFLOXACIN Value in next row Sensitive      4 SENSITIVEThis is a modified FDA-approved test that has been validated and its performance characteristics determined by the reporting laboratory.  This laboratory is certified under the Clinical Laboratory Improvement Amendments CLIA as qualified to  perform high complexity clinical laboratory testing.    GENTAMICIN Value in next row Sensitive      4 SENSITIVEThis is a modified FDA-approved test that has been validated and its performance characteristics determined by the reporting laboratory.  This laboratory is certified under the Clinical Laboratory Improvement Amendments CLIA as qualified to perform high complexity clinical laboratory testing.    NITROFURANTOIN  Value in next row Sensitive      4 SENSITIVEThis is a modified FDA-approved test that has been validated and its performance characteristics determined by the reporting laboratory.  This laboratory is certified under the Clinical Laboratory Improvement Amendments CLIA  as qualified to perform high complexity clinical laboratory testing.    TRIMETH/SULFA Value in next row Sensitive      4 SENSITIVEThis is a modified FDA-approved test that has been validated and its performance characteristics determined by the reporting laboratory.  This laboratory is certified under the Clinical Laboratory Improvement Amendments CLIA as qualified to perform high complexity clinical laboratory testing.    AMPICILLIN /SULBACTAM Value in next row Resistant      4 SENSITIVEThis is a modified FDA-approved test that has been validated and its performance characteristics determined by the reporting laboratory.  This laboratory is certified under the Clinical Laboratory Improvement Amendments CLIA as qualified to perform high complexity clinical laboratory testing.    PIP/TAZO Value in next row Sensitive      <=4 SENSITIVEThis is a modified FDA-approved test that has been validated and its performance characteristics determined by the reporting laboratory.  This laboratory is certified under the Clinical Laboratory Improvement Amendments CLIA as qualified to perform high complexity clinical laboratory testing.    MEROPENEM Value in next row Sensitive      <=4 SENSITIVEThis is a modified FDA-approved test that has  been validated and its performance characteristics determined by the reporting laboratory.  This laboratory is certified under the Clinical Laboratory Improvement Amendments CLIA as qualified to perform high complexity clinical laboratory testing.    * 60,000 COLONIES/mL ESCHERICHIA COLI         Radiology Studies: NM Pulmonary Perfusion Result Date: 10/16/2024 EXAM: NM Lung Perfusion Scan. CLINICAL HISTORY: elevated d dimer Elevated D-dimer. TECHNIQUE: Radiolabeled MAA was administered intravenously and planar images of the lungs were obtained in multiple projections. RADIOPHARMACEUTICAL: 4.4 millicurie Technetium-57m albumin  aggregated (MAA) injection solution. COMPARISON: Paracentesis chest radiograph 10/15/2024. FINDINGS: PERFUSION: Perfusion defect in the right mid lung, related to fluid within the oblique fissure as seen on the prior chest radiograph. No wedge-shaped peripheral perfusion defect, suggesting no acute pulmonary embolism. Right pleural effusion. Cardiomegaly. IMPRESSION: 1. No acute pulmonary embolism. 2. Right pleural effusion and cardiomegaly. Electronically signed by: Norleen Boxer MD 10/16/2024 02:20 PM EST RP Workstation: [id number]   US  CHEST (PLEURAL EFFUSION) Result Date: 10/16/2024 INDICATION: 79 year old female. Endorsing shortness of breath. Found to have a small right-sided pleural effusion. Request is for therapeutic and diagnostic thoracentesis. EXAM: CHEST ULTRASOUND COMPARISON:  CT chest dated October 10, 2024 and chest x-ray dated October 15, 2024 FINDINGS: Small right pleural effusion. No significant pocket of fluid or percutaneous window to allow safe thoracentesis. Patient uncooperative and unable to follow commands. Risks outweigh the benefits. IMPRESSION: Small left-sided pleural effusion with no safe window for percutaneous access. Couple of patient's inability mass the procedure was not performed due to safety concerns. Read by: Delon Beagle, NP  Electronically Signed   By: Ester Sides M.D.   On: 10/16/2024 10:15   ECHOCARDIOGRAM LIMITED Result Date: 10/15/2024    ECHOCARDIOGRAM LIMITED REPORT   Patient Name:   [name] Date of Exam: 10/15/2024 Medical Rec #:  [id number]       Height:       62.0 in Accession #:    [id number]      Weight:       149.0 lb Date of Birth:  [birth year]       BSA:          1.687 m Patient Age:    78 years        BP:           140/68 mmHg Patient Gender:  F               HR:           78 bpm. Exam Location:  [facility] Procedure: Limited Echo, Cardiac Doppler and Color Doppler (Both Spectral and            Color Flow Doppler were utilized during procedure). Indications:    CHF I50.31  History:        Patient has prior history of Echocardiogram examinations, most                 recent 07/22/2024. CHF, Stroke and COPD, Arrythmias:Atrial                 Fibrillation; Risk Factors:Hypertension, Diabetes and                 Dyslipidemia.                 Aortic Valve: 21 mm Magna bioprosthetic valve is present in the                 aortic position. Procedure Date: 07/18/2013.                 Mitral Valve: 25 mm Manga Ease bioprosthetic valve valve is                 present in the mitral position. Procedure Date: 07/18/13.  Sonographer:    Tinnie Gosling RDCS Referring Phys: [phone] DAVID TAT IMPRESSIONS  1. Left ventricular ejection fraction, by estimation, is 45%. The left ventricle has mildly decreased function. Left ventricular endocardial border not optimally defined to evaluate regional wall motion. Left ventricular diastolic function could not be evaluated. There is the interventricular septum is flattened in systole and diastole, consistent with right ventricular pressure and volume overload.  2. Right ventricular systolic function is severely reduced. The right ventricular size is moderately enlarged. There is moderately elevated pulmonary artery systolic pressure. The estimated right ventricular systolic pressure is 54.8  mmHg.  3. The inferior vena cava is normal in size with <50% respiratory variability, suggesting right atrial pressure of 8 mmHg. Comparison(s): Changes from prior study are noted. LV and RV function worsened. FINDINGS  Left Ventricle: Left ventricular ejection fraction, by estimation, is 45%. The left ventricle has mildly decreased function. Left ventricular endocardial border not optimally defined to evaluate regional wall motion. The interventricular septum is flattened in systole and diastole, consistent with right ventricular pressure and volume overload. Left ventricular diastolic function could not be evaluated. Right Ventricle: The right ventricular size is moderately enlarged. No increase in right ventricular wall thickness. Right ventricular systolic function is severely reduced. There is moderately elevated pulmonary artery systolic pressure. The tricuspid regurgitant velocity is 3.42 m/s, and with an assumed right atrial pressure of 8 mmHg, the estimated right ventricular systolic pressure is 54.8 mmHg. Mitral Valve: The mitral valve has been repaired/replaced. There is a 25 mm Manga Ease bioprosthetic valve present in the mitral position. Procedure Date: 07/18/13. Tricuspid Valve: The tricuspid valve is normal in structure. Tricuspid valve regurgitation is mild. Aortic Valve: The aortic valve has been repaired/replaced. There is a 21 mm Magna bioprosthetic valve present in the aortic position. Procedure Date: 07/18/2013. Venous: The inferior vena cava is normal in size with less than 50% respiratory variability, suggesting right atrial pressure of 8 mmHg. LEFT VENTRICLE PLAX 2D LVIDd:         3.50 cm LVIDs:  2.70 cm LV PW:         1.10 cm LV IVS:        0.90 cm  IVC IVC diam: 1.70 cm RIGHT ATRIUM           Index RA Area:     17.00 cm RA Volume:   45.40 ml  26.91 ml/m  TRICUSPID VALVE TR Peak grad:   46.8 mmHg TR Vmax:        342.00 cm/s Vishnu Priya Mallipeddi Electronically signed by Diannah Late Mallipeddi Signature Date/Time: 10/15/2024/4:35:11 PM    Final         Scheduled Meds:  amiodarone   200 mg Oral Daily   [START ON 10/18/2024] apixaban   5 mg Oral BID   apixaban   5 mg Oral Once   atorvastatin   40 mg Oral Daily   budesonide -glycopyrrolate -formoterol   2 puff Inhalation BID   cefadroxil   500 mg Oral BID   feeding supplement  237 mL Oral BID BM   furosemide   40 mg Intravenous Once   furosemide   80 mg Intravenous BID   HYDROcodone  bit-homatropine  5 mL Oral QHS   insulin  aspart  0-5 Units Subcutaneous QHS   insulin  aspart  0-9 Units Subcutaneous TID WC   ipratropium-albuterol   3 mL Nebulization TID   levothyroxine   100 mcg Oral QAC breakfast   lidocaine  HCl (PF)  10 mL Intradermal Once   pantoprazole   40 mg Oral QAC breakfast   potassium chloride   40 mEq Oral BID     LOS: 9 days    Time spent: 55 minutes    Tameron Lama JONETTA Fairly, DO [facility]  If 7PM-7AM, please contact night-coverage [email or web address] 10/17/2024, 12:48 PM   "

## 2024-10-17 NOTE — Plan of Care (Signed)
  Problem: Education: Goal: Knowledge of General Education information will improve Description: Including pain rating scale, medication(s)/side effects and non-pharmacologic comfort measures Outcome: Progressing   Problem: Health Behavior/Discharge Planning: Goal: Ability to manage health-related needs will improve Outcome: Progressing   Problem: Activity: Goal: Risk for activity intolerance will decrease Outcome: Progressing   Problem: Nutrition: Goal: Adequate nutrition will be maintained Outcome: Progressing   Problem: Elimination: Goal: Will not experience complications related to bowel motility Outcome: Progressing   Problem: Pain Managment: Goal: General experience of comfort will improve and/or be controlled Outcome: Progressing

## 2024-10-17 NOTE — Progress Notes (Signed)
 Occupational Therapy Treatment Patient Details Name: [name] MRN: [id number] DOB: October 11, 1945 Today's Date: 10/17/2024   History of present illness [name] is a 79 y.o. female with medical history significant for paroxysmal A-fib on Eliquis , chronic HFpEF, prior CVA, carotid artery stenosis, valvular heart disease, CKD 3B, hypertension, hyperlipidemia, type 2 diabetes, hypothyroidism, COPD, presents to the ER with complaints of sudden onset shortness of breath, around 8 PM tonight.  Associated with bilateral lower extremity edema.  No reported anginal symptoms.  The patient was brought into the ER for further evaluation.   OT comments  Pt agreeable to OT treatment. Pt appeared tired and fatigued easily today. Pt was able to doff and on a sock without physical assist. CGA to min A needed to don briefs seated in the chair followed by standing to pull up briefs. Pt on 2 LPM supplemental O2 throughout the session. Pt only able to ambulate to the door and back with slow, labored effort using the RW. Pt left in the bed with call bell within reach and bed alarm set. Pt will benefit from continued OT in the hospital to increase strength, balance, and endurance for safe ADL's.         If plan is discharge home, recommend the following:  A little help with walking and/or transfers;A little help with bathing/dressing/bathroom;Assistance with cooking/housework;Assist for transportation;Help with stairs or ramp for entrance   Equipment Recommendations  None recommended by OT    Recommendations for Other Services      Precautions / Restrictions Precautions Precautions: Fall Recall of Precautions/Restrictions: Intact Restrictions Weight Bearing Restrictions Per Provider Order: No       Mobility Bed Mobility Overal bed mobility: Needs Assistance Bed Mobility: Sit to Supine     Supine to sit: Mod assist     General bed mobility comments: Assist to lift B LE into bed. Pt able to  scoot to [facility] once supine.    Transfers Overall transfer level: Needs assistance Equipment used: Rolling walker (2 wheels) Transfers: Sit to/from Stand Sit to Stand: Min assist           General transfer comment: Slight assist at times to boost from sitting surface with use of RW.     Balance Overall balance assessment: Needs assistance Sitting-balance support: Feet supported, No upper extremity supported Sitting balance-Leahy Scale: Fair Sitting balance - Comments: fair seated edge of chair   Standing balance support: Reliant on assistive device for balance, During functional activity, Bilateral upper extremity supported Standing balance-Leahy Scale: Fair Standing balance comment: fair/poor using RW                           ADL either performed or assessed with clinical judgement   ADL Overall ADL's : Needs assistance/impaired         Upper Body Bathing: Set up;Sitting Upper Body Bathing Details (indicate cue type and reason): To don gown seated in the chair.         Lower Body Dressing: Contact guard assist;Sitting/lateral leans;Minimal assistance Lower Body Dressing Details (indicate cue type and reason): Pt able to doff and don sock seated in the recliner. Pt also able to don her briefs while seated and then while standing with CGA to min A for standing balance.             Functional mobility during ADLs: Contact guard assist;Rolling walker (2 wheels);Minimal assistance General ADL Comments: Pt able to ambulate from  the chair to the door and back. Pt requested to turn around once at the door due to fatigue.      Communication Communication Communication: No apparent difficulties   Cognition Arousal: Alert Behavior During Therapy: WFL for tasks assessed/performed Cognition: No apparent impairments                               Following commands: Intact        Cueing   Cueing Techniques: Verbal cues, Tactile cues   Exercises                    Pertinent Vitals/ Pain       Pain Assessment Pain Assessment: No/denies pain                                                           Frequency  Min 2X/week        Progress Toward Goals  OT Goals(current goals can now be found in the care plan section)  Progress towards OT goals: Progressing toward goals  Acute Rehab OT Goals Patient Stated Goal: To go home. OT Goal Formulation: With patient Time For Goal Achievement: 10/31/24 Potential to Achieve Goals: Good ADL Goals Pt Will Perform Grooming: Independently;standing Pt Will Perform Upper Body Bathing: Independently;sitting Pt Will Perform Lower Body Dressing: Independently;sitting/lateral leans Pt Will Transfer to Toilet: with modified independence Pt/caregiver will Perform Home Exercise Program: Increased strength;Both right and left upper extremity;With written HEP provided  Plan                                       End of Session Equipment Utilized During Treatment: Rolling walker (2 wheels);Oxygen   OT Visit Diagnosis: Unsteadiness on feet (R26.81);Repeated falls (R29.6);Muscle weakness (generalized) (M62.81)   Activity Tolerance Patient tolerated treatment well   Patient Left in bed;with call bell/phone within reach;with bed alarm set;Other (comment);with family/visitor present [facility] staff present.)   Nurse Communication          Time: [phone] OT Time Calculation (min): 19 min  Charges: OT General Charges $OT Visit: 1 Visit OT Treatments $Self Care/Home Management : 8-22 mins   Shenique Childers OT, MOT  Jayson Person 10/17/2024, 2:56 PM

## 2024-10-17 NOTE — Plan of Care (Signed)
" °  Problem: Education: Goal: Knowledge of General Education information will improve Description: Including pain rating scale, medication(s)/side effects and non-pharmacologic comfort measures Outcome: Progressing   Problem: Health Behavior/Discharge Planning: Goal: Ability to manage health-related needs will improve Outcome: Progressing   Problem: Clinical Measurements: Goal: Ability to maintain clinical measurements within normal limits will improve Outcome: Progressing Goal: Diagnostic test results will improve Outcome: Progressing Goal: Respiratory complications will improve Outcome: Progressing   Problem: Nutrition: Goal: Adequate nutrition will be maintained Outcome: Progressing   Problem: Coping: Goal: Level of anxiety will decrease Outcome: Progressing   Problem: Elimination: Goal: Will not experience complications related to bowel motility Outcome: Progressing Goal: Will not experience complications related to urinary retention Outcome: Progressing   Problem: Safety: Goal: Ability to remain free from injury will improve Outcome: Progressing   Problem: Skin Integrity: Goal: Risk for impaired skin integrity will decrease Outcome: Progressing   Problem: Education: Goal: Ability to describe self-care measures that may prevent or decrease complications (Diabetes Survival Skills Education) will improve Outcome: Progressing Goal: Individualized Educational Video(s) Outcome: Progressing   Problem: Coping: Goal: Ability to adjust to condition or change in health will improve Outcome: Progressing   Problem: Fluid Volume: Goal: Ability to maintain a balanced intake and output will improve Outcome: Progressing   Problem: Health Behavior/Discharge Planning: Goal: Ability to identify and utilize available resources and services will improve Outcome: Progressing Goal: Ability to manage health-related needs will improve Outcome: Progressing   Problem:  Metabolic: Goal: Ability to maintain appropriate glucose levels will improve Outcome: Progressing   Problem: Nutritional: Goal: Maintenance of adequate nutrition will improve Outcome: Progressing Goal: Progress toward achieving an optimal weight will improve Outcome: Progressing   Problem: Skin Integrity: Goal: Risk for impaired skin integrity will decrease Outcome: Progressing   Problem: Tissue Perfusion: Goal: Adequacy of tissue perfusion will improve Outcome: Progressing   "

## 2024-10-17 NOTE — Progress Notes (Signed)
 Mobility Specialist Progress Note:    10/17/24 1332  Mobility  Activity Ambulated with assistance  Level of Assistance Minimal assist, patient does 75% or more  Assistive Device Front wheel walker  Distance Ambulated (ft) 20 ft  Range of Motion/Exercises Active;All extremities  Activity Response Tolerated well  Mobility Referral Yes  Mobility visit 1 Mobility  Mobility Specialist Start Time (ACUTE ONLY) 1332  Mobility Specialist Stop Time (ACUTE ONLY) 1350  Mobility Specialist Time Calculation (min) (ACUTE ONLY) 18 min   Pt received in chair, agreeable to mobility. Required MinA to stand and CGA to ambulate with RW. Tolerated well, c/o SOB and fatigued quickly. Returned to chair, husband in room. All needs met.  Evanthia Maund Mobility Specialist Please contact via Special Educational Needs Teacher or  Rehab office at [phone]

## 2024-10-17 NOTE — Progress Notes (Signed)
*  PRELIMINARY RESULTS* Echocardiogram Limited 2-D Echocardiogram has been performed.  [name] 10/17/2024, 3:18 PM

## 2024-10-17 NOTE — Progress Notes (Signed)
 "   Progress Note  Patient Name: [name] Date of Encounter: 10/17/2024  Primary Cardiologist: Alvan Carrier, MD  Subjective   Alert and oriented.  Hypoxic this morning.  On 2L nasal cannula oxygen .  Inadequate urine output on IV Lasix  80 mg twice daily.  Inpatient Medications    Scheduled Meds:  amiodarone   200 mg Oral Daily   [START ON 10/18/2024] apixaban   5 mg Oral BID   apixaban   5 mg Oral Once   atorvastatin   40 mg Oral Daily   budesonide -glycopyrrolate -formoterol   2 puff Inhalation BID   cefadroxil   500 mg Oral BID   feeding supplement  237 mL Oral BID BM   furosemide   80 mg Intravenous BID   HYDROcodone  bit-homatropine  5 mL Oral QHS   insulin  aspart  0-5 Units Subcutaneous QHS   insulin  aspart  0-9 Units Subcutaneous TID WC   ipratropium-albuterol   3 mL Nebulization TID   levothyroxine   100 mcg Oral QAC breakfast   lidocaine  HCl (PF)  10 mL Intradermal Once   pantoprazole   40 mg Oral QAC breakfast   potassium chloride   40 mEq Oral BID   Continuous Infusions:  PRN Meds: acetaminophen , azelastine , guaiFENesin -dextromethorphan , meclizine , melatonin, polyethylene glycol, prochlorperazine    Vital Signs    Vitals:   10/16/24 2100 10/17/24 0259 10/17/24 0500 10/17/24 0750  BP: 120/61  120/75   Pulse: 76  69   Resp: 19     Temp: 98 F (36.7 C)  97.7 F (36.5 C)   TempSrc: Oral  Oral   SpO2: 96%  98% (!) 84%  Weight:  64.8 kg    Height:        Intake/Output Summary (Last 24 hours) at 10/17/2024 1216 Last data filed at 10/17/2024 0930 Gross per 24 hour  Intake 240 ml  Output 600 ml  Net -360 ml   Filed Weights   10/15/24 0321 10/16/24 0438 10/17/24 0259  Weight: 67.6 kg 65.7 kg 64.8 kg    Telemetry     Personally reviewed.  NSR  ECG    Not performed today.  Physical Exam   GEN: In mild acute distress.   Neck: Unable to examine Cardiac: RRR, no murmur, rub, or gallop.  Respiratory: Nonlabored. Clear to auscultation bilaterally. GI:  Soft, nontender, bowel sounds present. MS: No edema; No deformity. Neuro:  Nonfocal. Psych: Alert and oriented x 3. Normal affect.  Labs    Chemistry Recent Labs  Lab 10/15/24 0529 10/16/24 0517 10/17/24 0406  NA 137 136 136  K 3.0* 3.7 3.0*  CL 95* 94* 94*  CO2 36* 29 28  GLUCOSE 93 148* 106*  BUN 21 18 14   CREATININE 0.95 1.15* 1.04*  CALCIUM  8.1* 8.5* 8.3*  PROT  --  6.6  --   ALBUMIN   --  3.5  --   AST  --  35  --   ALT  --  43  --   ALKPHOS  --  73  --   BILITOT  --  1.0  --   GFRNONAA >60 48* 54*  ANIONGAP 5 12 14      Hematology Recent Labs  Lab 10/13/24 0545 10/17/24 0406  WBC 8.3 8.1  RBC 3.88 3.91  HGB 10.4* 10.2*  HCT 33.2* 34.2*  MCV 85.6 87.5  MCH 26.8 26.1  MCHC 31.3 29.8*  RDW 16.5* 16.7*  PLT 128* 111*    Cardiac EnzymesNo results for input(s): TROPONINIHS in the last 720 hours.  BNPNo results for  input(s): BNP, PROBNP in the last 168 hours.   DDimerNo results for input(s): DDIMER in the last 168 hours.    Assessment & Plan   Acute hypoxic respiratory failure Acute systolic and diastolic diastolic heart failure Biventricular heart failure S/p bioprosthetic/bovine AVR and MVR in 2014 - Presented with worsening DOE, orthopnea and bilateral leg swelling x few weeks. - proBNP elevated, 5574. Hs troponins mildly elevated, 42>>41. - 600 mL urine output in the last 24 hours with net -0.4 L.  On IV Lasix  80 mg twice daily.  Will administer another dose of IV Lasix  80 mg today.  Check BMP in the evening. - Limited echocardiogram this admission showed LVEF 45% and severe RV systolic dysfunction with moderate RV enlargement.  Moderate pulmonary hypertension was noted.  These findings are new compared to echocardiogram from October 2025.  She also has prosthetic aortic and mitral valves that are not assessed this admission.  Prior echo from October 2025 showed increased gradients across the aortic prosthetic valve.  Mitral prostatic valve  remains normal.  Will obtain limited echocardiogram to interrogate aortic and mitral prosthesis very well. - She will likely need CT cardiac TAVR protocol, based on the pending limited echocardiogram findings, to further evaluate prosthetic aortic valve.  If prosthetic aortic valve is normal, she will need LHC and RHC.  She is extremely frail and I do not think she is in a stable condition to undergo any invasive cardiac procedures.  She is pending transfer to rehab to gain some physical strength before considering any of the above workup.   Paroxysmal atrial fibrillation - Continue amiodarone  200 mg once daily and Eliquis  5 mg twice daily.  EKG on admission showed NSR with first-degree AV block, prolonged PR interval, 281 ms.  No dizziness.  30-minute spent in reviewing prior medical records, reports, more than 3 labs, discussion and documentation.  Signed, Diannah SHAUNNA Maywood, MD  10/17/2024, 12:16 PM    "

## 2024-10-18 ENCOUNTER — Ambulatory Visit: Payer: Self-pay | Admitting: Cardiovascular Disease

## 2024-10-18 DIAGNOSIS — I50810 Right heart failure, unspecified: Secondary | ICD-10-CM | POA: Diagnosis not present

## 2024-10-18 DIAGNOSIS — I050 Rheumatic mitral stenosis: Secondary | ICD-10-CM

## 2024-10-18 DIAGNOSIS — I5041 Acute combined systolic (congestive) and diastolic (congestive) heart failure: Secondary | ICD-10-CM

## 2024-10-18 DIAGNOSIS — I480 Paroxysmal atrial fibrillation: Secondary | ICD-10-CM | POA: Diagnosis not present

## 2024-10-18 DIAGNOSIS — Z952 Presence of prosthetic heart valve: Secondary | ICD-10-CM | POA: Diagnosis not present

## 2024-10-18 DIAGNOSIS — I350 Nonrheumatic aortic (valve) stenosis: Secondary | ICD-10-CM

## 2024-10-18 DIAGNOSIS — J9601 Acute respiratory failure with hypoxia: Secondary | ICD-10-CM

## 2024-10-18 LAB — GLUCOSE, CAPILLARY
Glucose-Capillary: 96 mg/dL (ref 70–99)
Glucose-Capillary: 238 mg/dL — ABNORMAL HIGH (ref 70–99)
Glucose-Capillary: 182 mg/dL — ABNORMAL HIGH (ref 70–99)
Glucose-Capillary: 133 mg/dL — ABNORMAL HIGH (ref 70–99)
Glucose-Capillary: 173 mg/dL — ABNORMAL HIGH (ref 70–99)

## 2024-10-18 LAB — CBC
HCT: 33.3 % — ABNORMAL LOW (ref 36.0–46.0)
Hemoglobin: 10 g/dL — ABNORMAL LOW (ref 12.0–15.0)
MCH: 25.8 pg — ABNORMAL LOW (ref 26.0–34.0)
MCHC: 30 g/dL (ref 30.0–36.0)
MCV: 86 fL (ref 80.0–100.0)
Platelets: 104 K/uL — ABNORMAL LOW (ref 150–400)
RBC: 3.87 MIL/uL (ref 3.87–5.11)
RDW: 16.6 % — ABNORMAL HIGH (ref 11.5–15.5)
WBC: 7.8 K/uL (ref 4.0–10.5)
nRBC: 0 % (ref 0.0–0.2)

## 2024-10-18 LAB — BASIC METABOLIC PANEL WITH GFR
Anion gap: 11 (ref 5–15)
BUN: 14 mg/dL (ref 8–23)
CO2: 30 mmol/L (ref 22–32)
Calcium: 8.3 mg/dL — ABNORMAL LOW (ref 8.9–10.3)
Chloride: 94 mmol/L — ABNORMAL LOW (ref 98–111)
Creatinine, Ser: 1.04 mg/dL — ABNORMAL HIGH (ref 0.44–1.00)
GFR, Estimated: 54 mL/min — ABNORMAL LOW
Glucose, Bld: 92 mg/dL (ref 70–99)
Potassium: 3.6 mmol/L (ref 3.5–5.1)
Sodium: 135 mmol/L (ref 135–145)

## 2024-10-18 LAB — MAGNESIUM: Magnesium: 2.1 mg/dL (ref 1.7–2.4)

## 2024-10-18 MED ORDER — FUROSEMIDE 10 MG/ML IJ SOLN
40.0000 mg | Freq: Two times a day (BID) | INTRAMUSCULAR | Status: DC
  Administered 2024-10-18 – 2024-10-20 (×4): 40 mg via INTRAVENOUS
  Filled 2024-10-18 (×4): qty 4

## 2024-10-18 NOTE — Progress Notes (Signed)
 Mobility Specialist Progress Note:    10/18/24 1350  Mobility  Activity Ambulated with assistance  Level of Assistance Minimal assist, patient does 75% or more  Assistive Device Front wheel walker  Distance Ambulated (ft) 15 ft  Range of Motion/Exercises Active;All extremities  Activity Response Tolerated well  Mobility Referral Yes  Mobility visit 1 Mobility  Mobility Specialist Start Time (ACUTE ONLY) 1350  Mobility Specialist Stop Time (ACUTE ONLY) 1410  Mobility Specialist Time Calculation (min) (ACUTE ONLY) 20 min   Pt received in bed, agreeable to mobility. Required MinA to stand and ambulate with RW. Tolerated well, very weak and c/o lightheadedness. SpO2 during ambulation was 95% on 2L. Returned supine, NT and husband in room. All needs met.  Briauna Gilmartin Mobility Specialist Please contact via Special Educational Needs Teacher or  Rehab office at [phone]

## 2024-10-18 NOTE — Progress Notes (Signed)
 " PROGRESS NOTE    [name]  FMW:[id number] DOB: 13-Mar-1946 DOA: 10/08/2024 PCP: Sheryle Carwin, MD   Brief Narrative:   79 y.o. female with medical history significant for paroxysmal A-fib on Eliquis , chronic HFpEF, prior CVA, carotid artery stenosis, valvular heart disease, CKD 3B, hypertension, hyperlipidemia, type 2 diabetes, hypothyroidism, COPD, bioprosthetic aortic and mitral valve 07/18/2013 presenting with shortness of breath that began on the evening of 10/09/2023.  She has noted some lower extremity edema.  She was admitted with acute hypoxemic respiratory failure in the setting of acute on chronic HFpEF with new severe RV dysfunction.  She is currently being diuresed with IV Lasix  and cardiology is now following with recommendations for VQ scan and possible need for heart catheterization during this admission.  She is also noted to have E. coli UTI and was started on cefadroxil  with sensitivity noted.  Cardiology following and has repeated echocardiogram on 1/15 with moderate to severe prosthetic MS and AS he needs transfer to [facility] for CT cardiac TAVR protocol.  Cardiology to follow at [facility].   Assessment & Plan:   Principal Problem:   Acute on chronic diastolic (congestive) heart failure (HCC) Active Problems:   Upper airway cough syndrome in pt with mild cough variant asthma    PAF (paroxysmal atrial fibrillation) (HCC)   Long term current use of amiodarone    Long term (current) use of anticoagulants  Assessment and Plan:  Acute respiratory failure with hypoxia - Multifactorial including COPD exacerbation and CHF - Stable on 2 L nasal cannula>>96% - Wean oxygen  to room air for saturation greater 92% - 1/8 CT chest--new trace L-pleural eff and small loculated R-effusion; bilateral GGO - 1/13 CXR--slight increase R-pleural effusion.  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 and AS - LVEF 45% with severe RV systolic dysfunction noted -Continue IV Lasix  40 mg twice daily now per cardiology -VQ scan negative for PE - Repeat 2D echocardiogram showing prosthetic valve degeneration on 1/15 -Will need transfer to [facility] for CT cardiac TAVR protocol per cardiology and cardiology to follow-up on transfer   E coli UTI - Completed course of cefadroxil    CKD stage IIIb - Baseline creatinine 1.1-1.4 - Monitor with diuresis   Paroxysmal atrial fibrillation - Continue apixaban  - continue amiodarone  - currently in sinus   Elevated troponin - Troponin trend is flat, not consistent with ACS - Troponin 42>>41   Mixed hyperlipidemia - Continue statin   Diabetes mellitus type 2 - NovoLog  sliding scale - 09/12/2024 hemoglobin A1c 6.3 -  elevated CBGs secondary to steroids - novolog  sliding scale   Elevated D-dimer - VQ scan negative for PE   Social -spouse has decided he cannot take care of pt at home, ok with pt going to SNF -request repeat PT eval>>SNF    DVT prophylaxis: Apixaban  Code Status: DNR Family Communication: Caretaker at bedside 1/16 Disposition Plan:  Status is: Inpatient Remains inpatient appropriate because: Need for ongoing IV medications.   Consultants:  Cardiology  Procedures:  None  Antimicrobials:  Anti-infectives (From admission, onward)    Start     Dose/Rate Route Frequency Ordered Stop   10/13/24 1315  cefadroxil  (DURICEF) capsule 500 mg        500 mg Oral 2 times daily 10/13/24 1226 10/17/24 2109       Subjective:  Patient seen and evaluated today and denies any specific complaints or concerns and states that she urinated quite frequently yesterday.  Denies any chest pain or shortness of breath.  Objective: Vitals:   10/17/24 2022 10/18/24 0335 10/18/24 0914 10/18/24 0924  BP: (!) 117/52 130/62    Pulse: 79 80     Resp: 19 19    Temp: 99.1 F (37.3 C) 98.5 F (36.9 C)    TempSrc: Oral Oral    SpO2: 97% 98% 97% 99%  Weight:  65.4 kg    Height:       No intake or output data in the 24 hours ending 10/18/24 1117  Filed Weights   10/16/24 0438 10/17/24 0259 10/18/24 0335  Weight: 65.7 kg 64.8 kg 65.4 kg    Examination:  General exam: Appears calm and comfortable, awake/alert Respiratory system: Clear to auscultation. Respiratory effort normal.  2 L nasal cannula Cardiovascular system: S1 & S2 heard, RRR.  Gastrointestinal system: Abdomen is soft Central nervous system: Awake/alert Extremities: No edema Skin: No significant lesions noted Psychiatry: Flat affect.    Data Reviewed: I have personally reviewed following labs and imaging studies  CBC: Recent Labs  Lab 10/13/24 0545 10/17/24 0406 10/18/24 0527  WBC 8.3 8.1 7.8  HGB 10.4* 10.2* 10.0*  HCT 33.2* 34.2* 33.3*  MCV 85.6 87.5 86.0  PLT 128* 111* 104*   Basic Metabolic Panel: Recent Labs  Lab 10/14/24 0446 10/15/24 0529 10/16/24 0517 10/17/24 0406 10/18/24 0527  NA 134* 137 136 136 135  K 3.2* 3.0* 3.7 3.0* 3.6  CL 92* 95* 94* 94* 94*  CO2 33* 36* 29 28 30   GLUCOSE 125* 93 148* 106* 92  BUN 24* 21 18 14 14   CREATININE 1.09* 0.95 1.15* 1.04* 1.04*  CALCIUM  8.5* 8.1* 8.5* 8.3* 8.3*  MG 2.0 1.8 1.7 1.7 2.1   GFR: Estimated Creatinine Clearance: 38.3 mL/min (A) (by C-G formula based on SCr of 1.04 mg/dL (H)). Liver Function Tests: Recent Labs  Lab 10/16/24 0517  AST 35  ALT 43  ALKPHOS 73  BILITOT 1.0  PROT 6.6  ALBUMIN  3.5   No results for input(s): LIPASE, AMYLASE in the last 168 hours. No results for input(s): AMMONIA in the last 168 hours. Coagulation Profile: No results for input(s): INR, PROTIME in the last 168 hours. Cardiac Enzymes: No results for input(s): CKTOTAL, CKMB, CKMBINDEX, TROPONINI in the last 168 hours. BNP (last 3 results) Recent Labs    07/24/24 0438  09/11/24 1824 10/08/24 2147  PROBNP 2,155.0* 5,666.0* 5,574.0*   HbA1C: No results for input(s): HGBA1C in the last 72 hours. CBG: Recent Labs  Lab 10/17/24 0728 10/17/24 1122 10/17/24 1640 10/17/24 1940 10/18/24 0721  GLUCAP 147* 210* 129* 225* 96   Lipid Profile: No results for input(s): CHOL, HDL, LDLCALC, TRIG, CHOLHDL, LDLDIRECT in the last 72 hours. Thyroid  Function Tests: No results for input(s): TSH, T4TOTAL, FREET4, T3FREE, THYROIDAB in the last 72 hours. Anemia Panel: No results for input(s): VITAMINB12, FOLATE, FERRITIN, TIBC, IRON, RETICCTPCT in the last 72 hours. Sepsis Labs: No results for input(s): PROCALCITON, LATICACIDVEN in the last 168 hours.  Recent Results (from the past 240 hours)  Resp panel by RT-PCR (RSV, Flu A&B, Covid) Anterior Nasal Swab     Status: None   Collection Time: 10/09/24 12:05 PM   Specimen: Anterior Nasal Swab  Result Value Ref Range Status   SARS Coronavirus 2 by RT PCR NEGATIVE NEGATIVE Final    Comment: (NOTE) SARS-CoV-2 target  nucleic acids are NOT DETECTED.  The SARS-CoV-2 RNA is generally detectable in upper respiratory specimens during the acute phase of infection. The lowest concentration of SARS-CoV-2 viral copies this assay can detect is 138 copies/mL. A negative result does not preclude SARS-Cov-2 infection and should not be used as the sole basis for treatment or other patient management decisions. A negative result may occur with  improper specimen collection/handling, submission of specimen other than nasopharyngeal swab, presence of viral mutation(s) within the areas targeted by this assay, and inadequate number of viral copies(<138 copies/mL). A negative result must be combined with clinical observations, patient history, and epidemiological information. The expected result is Negative.  Fact Sheet for Patients:  bloggercourse.com  Fact Sheet for  Healthcare Providers:  seriousbroker.it  This test is no t yet approved or cleared by the [place]  FDA and  has been authorized for detection and/or diagnosis of SARS-CoV-2 by FDA under an Emergency Use Authorization (EUA). This EUA will remain  in effect (meaning this test can be used) for the duration of the COVID-19 declaration under Section 564(b)(1) of the Act, 21 U.S.C.section 360bbb-3(b)(1), unless the authorization is terminated  or revoked sooner.       Influenza A by PCR NEGATIVE NEGATIVE Final   Influenza B by PCR NEGATIVE NEGATIVE Final    Comment: (NOTE) The Xpert Xpress SARS-CoV-2/FLU/RSV plus assay is intended as an aid in the diagnosis of influenza from Nasopharyngeal swab specimens and should not be used as a sole basis for treatment. Nasal washings and aspirates are unacceptable for Xpert Xpress SARS-CoV-2/FLU/RSV testing.  Fact Sheet for Patients: bloggercourse.com  Fact Sheet for Healthcare Providers: seriousbroker.it  This test is not yet approved or cleared by the [place]  FDA and has been authorized for detection and/or diagnosis of SARS-CoV-2 by FDA under an Emergency Use Authorization (EUA). This EUA will remain in effect (meaning this test can be used) for the duration of the COVID-19 declaration under Section 564(b)(1) of the Act, 21 U.S.C. section 360bbb-3(b)(1), unless the authorization is terminated or revoked.     Resp Syncytial Virus by PCR NEGATIVE NEGATIVE Final    Comment: (NOTE) Fact Sheet for Patients: bloggercourse.com  Fact Sheet for Healthcare Providers: seriousbroker.it  This test is not yet approved or cleared by the [place]  FDA and has been authorized for detection and/or diagnosis of SARS-CoV-2 by FDA under an Emergency Use Authorization (EUA). This EUA will remain in effect (meaning this  test can be used) for the duration of the COVID-19 declaration under Section 564(b)(1) of the Act, 21 U.S.C. section 360bbb-3(b)(1), unless the authorization is terminated or revoked.  Performed at [facility], [address], [place], [place] [address]   Respiratory (~20 pathogens) panel by PCR     Status: None   Collection Time: 10/09/24  1:15 PM   Specimen: Nasopharyngeal Swab; Respiratory  Result Value Ref Range Status   Adenovirus NOT DETECTED NOT DETECTED Final   Coronavirus 229E NOT DETECTED NOT DETECTED Final    Comment: (NOTE) The Coronavirus on the Respiratory Panel, DOES NOT test for the novel  Coronavirus (2019 nCoV)    Coronavirus HKU1 NOT DETECTED NOT DETECTED Final   Coronavirus NL63 NOT DETECTED NOT DETECTED Final   Coronavirus OC43 NOT DETECTED NOT DETECTED Final   Metapneumovirus NOT DETECTED NOT DETECTED Final   Rhinovirus / Enterovirus NOT DETECTED NOT DETECTED Final   Influenza A NOT DETECTED NOT DETECTED Final   Influenza B NOT DETECTED NOT DETECTED Final  Parainfluenza Virus 1 NOT DETECTED NOT DETECTED Final   Parainfluenza Virus 2 NOT DETECTED NOT DETECTED Final   Parainfluenza Virus 3 NOT DETECTED NOT DETECTED Final   Parainfluenza Virus 4 NOT DETECTED NOT DETECTED Final   Respiratory Syncytial Virus NOT DETECTED NOT DETECTED Final   Bordetella pertussis NOT DETECTED NOT DETECTED Final   Bordetella Parapertussis NOT DETECTED NOT DETECTED Final   Chlamydophila pneumoniae NOT DETECTED NOT DETECTED Final   Mycoplasma pneumoniae NOT DETECTED NOT DETECTED Final    Comment: Performed at [facility], [address], [place], [place] [address]  Urine Culture (for pregnant, neutropenic or urologic patients or patients with an indwelling urinary catheter)     Status: Abnormal   Collection Time: 10/12/24  2:02 PM   Specimen: Urine, Catheterized  Result Value Ref Range Status   Specimen Description   Final    URINE, CATHETERIZED Performed at [facility], [address], [place], [place] [address]    Special Requests   Final    NONE Performed at [facility], [address], [place], [place] [address]    Culture 60,000 COLONIES/mL ESCHERICHIA COLI (A)  Final   Report Status 10/15/2024 FINAL  Final   Organism ID, Bacteria ESCHERICHIA COLI (A)  Final      Susceptibility   Escherichia coli - MIC*    AMPICILLIN  >=32 RESISTANT Resistant     CEFAZOLIN  (URINE) Value in next row Sensitive      4 SENSITIVEThis is a modified FDA-approved test that has been validated and its performance characteristics determined by the reporting laboratory.  This laboratory is certified under the Clinical Laboratory Improvement Amendments CLIA as qualified to perform high complexity clinical laboratory testing.    CEFEPIME Value in next row Sensitive      4 SENSITIVEThis is a modified FDA-approved test that has been validated and its performance characteristics determined by the reporting laboratory.  This laboratory is certified under the Clinical Laboratory Improvement Amendments CLIA as qualified to perform high complexity clinical laboratory testing.    ERTAPENEM Value in next row Sensitive      4 SENSITIVEThis is a modified FDA-approved test that has been validated and its performance characteristics determined by the reporting laboratory.  This laboratory is certified under the Clinical Laboratory Improvement Amendments CLIA as qualified to perform high complexity clinical laboratory testing.    CEFTRIAXONE  Value in next row Sensitive      4 SENSITIVEThis is a modified FDA-approved test that has been validated and its performance characteristics determined by the reporting laboratory.  This laboratory is certified under the Clinical Laboratory Improvement Amendments CLIA as qualified to perform high complexity clinical laboratory testing.    CIPROFLOXACIN Value in next row Sensitive      4 SENSITIVEThis is a modified FDA-approved test that has been validated and its  performance characteristics determined by the reporting laboratory.  This laboratory is certified under the Clinical Laboratory Improvement Amendments CLIA as qualified to perform high complexity clinical laboratory testing.    GENTAMICIN Value in next row Sensitive      4 SENSITIVEThis is a modified FDA-approved test that has been validated and its performance characteristics determined by the reporting laboratory.  This laboratory is certified under the Clinical Laboratory Improvement Amendments CLIA as qualified to perform high complexity clinical laboratory testing.    NITROFURANTOIN  Value in next row Sensitive      4 SENSITIVEThis is a modified FDA-approved test that has been validated and its performance characteristics determined  by the reporting laboratory.  This laboratory is certified under the Clinical Laboratory Improvement Amendments CLIA as qualified to perform high complexity clinical laboratory testing.    TRIMETH/SULFA Value in next row Sensitive      4 SENSITIVEThis is a modified FDA-approved test that has been validated and its performance characteristics determined by the reporting laboratory.  This laboratory is certified under the Clinical Laboratory Improvement Amendments CLIA as qualified to perform high complexity clinical laboratory testing.    AMPICILLIN /SULBACTAM Value in next row Resistant      4 SENSITIVEThis is a modified FDA-approved test that has been validated and its performance characteristics determined by the reporting laboratory.  This laboratory is certified under the Clinical Laboratory Improvement Amendments CLIA as qualified to perform high complexity clinical laboratory testing.    PIP/TAZO Value in next row Sensitive      <=4 SENSITIVEThis is a modified FDA-approved test that has been validated and its performance characteristics determined by the reporting laboratory.  This laboratory is certified under the Clinical Laboratory Improvement Amendments CLIA as  qualified to perform high complexity clinical laboratory testing.    MEROPENEM Value in next row Sensitive      <=4 SENSITIVEThis is a modified FDA-approved test that has been validated and its performance characteristics determined by the reporting laboratory.  This laboratory is certified under the Clinical Laboratory Improvement Amendments CLIA as qualified to perform high complexity clinical laboratory testing.    * 60,000 COLONIES/mL ESCHERICHIA COLI         Radiology Studies: ECHOCARDIOGRAM LIMITED Result Date: 10/17/2024    ECHOCARDIOGRAM LIMITED REPORT   Patient Name:   [name] Date of Exam: 10/17/2024 Medical Rec #:  [id number]       Height:       62.0 in Accession #:    [id number]      Weight:       142.9 lb Date of Birth:  November 23, 1945       BSA:          1.657 m Patient Age:    78 years        BP:           120/75 mmHg Patient Gender: F               HR:           69 bpm. Exam Location:  [facility] Procedure: Limited Echo, Cardiac Doppler and Limited Color Doppler (Both            Spectral and Color Flow Doppler were utilized during procedure). Indications:    S/P Aortic Valve replacement Z95.2  History:        Patient has prior history of Echocardiogram examinations, most                 recent 10/15/2024. CHF, Stroke, Arrythmias:Atrial Fibrillation;                 Risk Factors:Hypertension, Diabetes and Dyslipidemia. Aortic                 Valve: 21 mm Magna bioprosthetic valve is present                 in the aortic position. Procedure Date: 07/18/2013. Mitral                 Valve: 25 mm Manga Ease bioprosthetic valve valve is present in  the mitral position. Procedure Date: 07/18/13.                  Aortic Valve: 21 mm Magna Ease bioprosthetic valve is present in                 the aortic position.                 Mitral Valve: 25 mm Magna Ease bioprosthetic valve valve is                 present in the mitral position.  Sonographer:    Aida Pizza RCS Referring  Phys: [id number] VISHNU P MALLIPEDDI IMPRESSIONS  1. Limited study.  2. Left ventricular ejection fraction, by estimation, is 60 to 65%. The left ventricle has normal function. The left ventricle has no regional wall motion abnormalities. There is the interventricular septum is flattened in systole and diastole, consistent with right ventricular pressure and volume overload.  3. RVSP not estimated. Right ventricular systolic function is severely reduced. The right ventricular size is moderately enlarged.  4. Papillary muscle calcification noted. Trivial mitral valve regurgitation. Moderate to severe mitral stenosis. The mean mitral valve gradient is 10.0 mmHg. There is a 25 mm Magna Ease bioprosthetic valve present in the mitral position.  5. There is mild calcification of the prosthetic aortic valve. Aortic valve regurgitation is at least mild, not clear that PHT is accurate and jet somewhat obscured by transmitral flow jet. Moderate to severe aortic valve stenosis. There is a 21 mm Magna Ease bioprosthetic valve present in the aortic position. Aortic valve mean gradient measures 21.3 mmHg. Dimentionless index 0.25. Comparison(s): If clinically reasonable, consider TEE for further evaluation of prosthetic valvular function. Both mitral and aortic mean gradients have increased compared with prior complete study. FINDINGS  Left Ventricle: Left ventricular ejection fraction, by estimation, is 60 to 65%. The left ventricle has normal function. The left ventricle has no regional wall motion abnormalities. The left ventricular internal cavity size was normal in size. There is  no left ventricular hypertrophy. The interventricular septum is flattened in systole and diastole, consistent with right ventricular pressure and volume overload. Right Ventricle: RVSP not estimated. The right ventricular size is moderately enlarged. No increase in right ventricular wall thickness. Right ventricular systolic function is severely  reduced. Mitral Valve: Papillary muscle calcification noted. Trivial mitral valve regurgitation. There is a 25 mm Magna Ease bioprosthetic valve present in the mitral position. Moderate to severe mitral valve stenosis. MV peak gradient, 27.9 mmHg. The mean mitral  valve gradient is 10.0 mmHg. Aortic Valve: There is mild calcification of the aortic valve. Aortic valve regurgitation is mild. Aortic regurgitation PHT measures 175 msec. Moderate to severe aortic stenosis is present. Aortic valve mean gradient measures 21.3 mmHg. Aortic valve peak  gradient measures 37.6 mmHg. Aortic valve area, by VTI measures 0.44 cm. There is a 21 mm Magna Ease bioprosthetic valve present in the aortic position. Pulmonic Valve: The pulmonic valve was grossly normal. Pulmonic valve regurgitation is trivial. Aorta: The aortic root is normal in size and structure. LEFT VENTRICLE PLAX 2D LVIDd:         3.70 cm LVIDs:         2.50 cm LV PW:         1.00 cm LV IVS:        0.80 cm LVOT diam:     1.50 cm LV SV:         29  LV SV Index:   17 LVOT Area:     1.77 cm  LEFT ATRIUM         Index LA diam:    3.90 cm 2.35 cm/m  AORTIC VALVE AV Area (Vmax):    0.47 cm AV Area (Vmean):   0.49 cm AV Area (VTI):     0.44 cm AV Vmax:           306.50 cm/s AV Vmean:          214.333 cm/s AV VTI:            0.652 m AV Peak Grad:      37.6 mmHg AV Mean Grad:      21.3 mmHg LVOT Vmax:         81.17 cm/s LVOT Vmean:        59.367 cm/s LVOT VTI:          0.162 m LVOT/AV VTI ratio: 0.25 AI PHT:            175 msec  AORTA Ao Root diam: 2.60 cm MITRAL VALVE MV Area (PHT): 1.14 cm     SHUNTS MV Area VTI:   0.38 cm     Systemic VTI:  0.16 m MV Peak grad:  27.9 mmHg    Systemic Diam: 1.50 cm MV Mean grad:  10.0 mmHg MV Vmax:       2.64 m/s MV Vmean:      136.0 cm/s MV Decel Time: 665 msec MV E velocity: 254.00 cm/s Jayson Sierras MD Electronically signed by Jayson Sierras MD Signature Date/Time: 10/17/2024/3:51:52 PM    Final    NM Pulmonary  Perfusion Result Date: 10/16/2024 EXAM: NM Lung Perfusion Scan. CLINICAL HISTORY: elevated d dimer Elevated D-dimer. TECHNIQUE: Radiolabeled MAA was administered intravenously and planar images of the lungs were obtained in multiple projections. RADIOPHARMACEUTICAL: 4.4 millicurie Technetium-93m albumin  aggregated (MAA) injection solution. COMPARISON: Paracentesis chest radiograph 10/15/2024. FINDINGS: PERFUSION: Perfusion defect in the right mid lung, related to fluid within the oblique fissure as seen on the prior chest radiograph. No wedge-shaped peripheral perfusion defect, suggesting no acute pulmonary embolism. Right pleural effusion. Cardiomegaly. IMPRESSION: 1. No acute pulmonary embolism. 2. Right pleural effusion and cardiomegaly. Electronically signed by: Norleen Boxer MD 10/16/2024 02:20 PM EST RP Workstation: [id number]        Scheduled Meds:  amiodarone   200 mg Oral Daily   apixaban   5 mg Oral BID   atorvastatin   40 mg Oral Daily   budesonide -glycopyrrolate -formoterol   2 puff Inhalation BID   feeding supplement  237 mL Oral BID BM   furosemide   40 mg Intravenous BID   HYDROcodone  bit-homatropine  5 mL Oral QHS   insulin  aspart  0-5 Units Subcutaneous QHS   insulin  aspart  0-9 Units Subcutaneous TID WC   ipratropium-albuterol   3 mL Nebulization TID   levothyroxine   100 mcg Oral QAC breakfast   lidocaine  HCl (PF)  10 mL Intradermal Once   pantoprazole   40 mg Oral QAC breakfast   potassium chloride   40 mEq Oral BID     LOS: 10 days    Time spent: 55 minutes    Eddie Koc JONETTA Fairly, DO [facility]  If 7PM-7AM, please contact night-coverage [email or web address] 10/18/2024, 11:17 AM   "

## 2024-10-18 NOTE — Plan of Care (Signed)

## 2024-10-18 NOTE — Progress Notes (Signed)
 Transport team arrived and transported patient to another hospital at this time. Previous nurse assigned to this patient had stated that she had already called report to the receiving hospital of this patient. Patients husband at bedside. Patient transferred out of this facility and to new hospital at this time.

## 2024-10-18 NOTE — Progress Notes (Signed)
 "   Progress Note  Patient Name: [name] Date of Encounter: 10/18/2024  Primary Cardiologist: Alvan Carrier, MD  Subjective   Alert and oriented.  Hypoxic on room air.  Still short of breath.  Tachypneic.  Inpatient Medications    Scheduled Meds:  amiodarone   200 mg Oral Daily   apixaban   5 mg Oral BID   atorvastatin   40 mg Oral Daily   budesonide -glycopyrrolate -formoterol   2 puff Inhalation BID   feeding supplement  237 mL Oral BID BM   furosemide   40 mg Intravenous BID   HYDROcodone  bit-homatropine  5 mL Oral QHS   insulin  aspart  0-5 Units Subcutaneous QHS   insulin  aspart  0-9 Units Subcutaneous TID WC   ipratropium-albuterol   3 mL Nebulization TID   levothyroxine   100 mcg Oral QAC breakfast   lidocaine  HCl (PF)  10 mL Intradermal Once   pantoprazole   40 mg Oral QAC breakfast   potassium chloride   40 mEq Oral BID   Continuous Infusions:  PRN Meds: acetaminophen , azelastine , guaiFENesin -dextromethorphan , meclizine , melatonin, polyethylene glycol, prochlorperazine    Vital Signs    Vitals:   10/17/24 2022 10/18/24 0335 10/18/24 0914 10/18/24 0924  BP: (!) 117/52 130/62    Pulse: 79 80    Resp: 19 19    Temp: 99.1 F (37.3 C) 98.5 F (36.9 C)    TempSrc: Oral Oral    SpO2: 97% 98% 97% 99%  Weight:  65.4 kg    Height:       No intake or output data in the 24 hours ending 10/18/24 1156  Filed Weights   10/16/24 0438 10/17/24 0259 10/18/24 0335  Weight: 65.7 kg 64.8 kg 65.4 kg    Telemetry     Personally reviewed.  NSR  ECG    Not performed today.  Physical Exam   GEN: In mild acute distress.   Neck: Unable to examine Cardiac: RRR, no murmur, rub, or gallop.  Respiratory: Nonlabored. Clear to auscultation bilaterally. GI: Soft, nontender, bowel sounds present. MS: No edema; No deformity. Neuro:  Nonfocal. Psych: Alert and oriented x 3. Normal affect.  Labs    Chemistry Recent Labs  Lab 10/16/24 0517 10/17/24 0406 10/18/24 0527   NA 136 136 135  K 3.7 3.0* 3.6  CL 94* 94* 94*  CO2 29 28 30   GLUCOSE 148* 106* 92  BUN 18 14 14   CREATININE 1.15* 1.04* 1.04*  CALCIUM  8.5* 8.3* 8.3*  PROT 6.6  --   --   ALBUMIN  3.5  --   --   AST 35  --   --   ALT 43  --   --   ALKPHOS 73  --   --   BILITOT 1.0  --   --   GFRNONAA 48* 54* 54*  ANIONGAP 12 14 11      Hematology Recent Labs  Lab 10/13/24 0545 10/17/24 0406 10/18/24 0527  WBC 8.3 8.1 7.8  RBC 3.88 3.91 3.87  HGB 10.4* 10.2* 10.0*  HCT 33.2* 34.2* 33.3*  MCV 85.6 87.5 86.0  MCH 26.8 26.1 25.8*  MCHC 31.3 29.8* 30.0  RDW 16.5* 16.7* 16.6*  PLT 128* 111* 104*    Cardiac EnzymesNo results for input(s): TROPONINIHS in the last 720 hours.  BNPNo results for input(s): BNP, PROBNP in the last 168 hours.   DDimerNo results for input(s): DDIMER in the last 168 hours.    Assessment & Plan   Acute hypoxic respiratory failure Acute systolic and diastolic diastolic  heart failure Right ventricular failure S/p bioprosthetic/bovine AVR and MVR in 2014 - Presented with worsening DOE, orthopnea and bilateral leg swelling x few weeks. - proBNP elevated, 5574. Hs troponins mildly elevated, 42>>41. - Not adequate urine output on IV Lasix  80 mg twice daily. continues to be tachypneic, short of breath and hypoxic on room air.  Maintaining saturations well on 2L nasal cannula oxygen . - Limited echocardiogram this admission showed LVEF 45% and severe RV systolic dysfunction with moderate RV enlargement.  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 These findings are new compared to echocardiogram from October 2025.  Prior echo from October 2025 showed increased gradients across the aortic prosthetic valve.  Mitral  prostatic valve remains normal.  But limited echocardiogram this admission showed increased gradients across both aortic and mitral prosthetic valves which are worse compared to prior echo from October 2025. - Currently on IV Lasix  80 mg twice daily, cut back to IV Lasix  40 mg twice daily. - Transfer to [facility] for CT cardiac TAVR protocol.  Not a candidate for TEE due to hypoxia.  Currently on 2L nasal cannula oxygen . This is new.  On room air at home.  She probably might need invasive cardiac procedures in the future depending on the CT cardiac TAVR study.   Paroxysmal atrial fibrillation - Continue amiodarone  200 mg once daily and Eliquis  5 mg twice daily.  EKG on admission showed NSR with first-degree AV block, prolonged PR interval, 281 ms.  No dizziness.  40-minute spent in reviewing prior medical records, reports, more than 3 labs, discussion and documentation.  Signed, Diannah SHAUNNA [place], MD  10/18/2024, 11:56 AM    "

## 2024-10-18 NOTE — Progress Notes (Signed)
 Nurse at bedside as well as husband of pt. Pt complaint of SOB and drowsiness. MD is aware patient is awaiting transfer to [facility] for continued care. Will follow plan of care until transfer.

## 2024-10-18 NOTE — Progress Notes (Signed)
 Remote Loop Recorder Transmission

## 2024-10-18 NOTE — Plan of Care (Signed)

## 2024-10-19 DIAGNOSIS — I50810 Right heart failure, unspecified: Secondary | ICD-10-CM | POA: Diagnosis not present

## 2024-10-19 DIAGNOSIS — I5033 Acute on chronic diastolic (congestive) heart failure: Secondary | ICD-10-CM | POA: Diagnosis not present

## 2024-10-19 DIAGNOSIS — J9601 Acute respiratory failure with hypoxia: Secondary | ICD-10-CM | POA: Diagnosis not present

## 2024-10-19 DIAGNOSIS — Z7901 Long term (current) use of anticoagulants: Secondary | ICD-10-CM | POA: Diagnosis not present

## 2024-10-19 DIAGNOSIS — Z9889 Other specified postprocedural states: Secondary | ICD-10-CM | POA: Diagnosis not present

## 2024-10-19 DIAGNOSIS — Z952 Presence of prosthetic heart valve: Secondary | ICD-10-CM | POA: Insufficient documentation

## 2024-10-19 DIAGNOSIS — I5041 Acute combined systolic (congestive) and diastolic (congestive) heart failure: Secondary | ICD-10-CM | POA: Diagnosis not present

## 2024-10-19 DIAGNOSIS — I480 Paroxysmal atrial fibrillation: Secondary | ICD-10-CM | POA: Diagnosis not present

## 2024-10-19 LAB — BASIC METABOLIC PANEL WITH GFR
Anion gap: 10 (ref 5–15)
BUN: 19 mg/dL (ref 8–23)
CO2: 27 mmol/L (ref 22–32)
Calcium: 8.7 mg/dL — ABNORMAL LOW (ref 8.9–10.3)
Chloride: 94 mmol/L — ABNORMAL LOW (ref 98–111)
Creatinine, Ser: 1.15 mg/dL — ABNORMAL HIGH (ref 0.44–1.00)
GFR, Estimated: 48 mL/min — ABNORMAL LOW
Glucose, Bld: 119 mg/dL — ABNORMAL HIGH (ref 70–99)
Potassium: 5.2 mmol/L — ABNORMAL HIGH (ref 3.5–5.1)
Sodium: 131 mmol/L — ABNORMAL LOW (ref 135–145)

## 2024-10-19 LAB — GLUCOSE, CAPILLARY
Glucose-Capillary: 219 mg/dL — ABNORMAL HIGH (ref 70–99)
Glucose-Capillary: 207 mg/dL — ABNORMAL HIGH (ref 70–99)
Glucose-Capillary: 111 mg/dL — ABNORMAL HIGH (ref 70–99)
Glucose-Capillary: 217 mg/dL — ABNORMAL HIGH (ref 70–99)

## 2024-10-19 LAB — MAGNESIUM: Magnesium: 1.7 mg/dL (ref 1.7–2.4)

## 2024-10-19 LAB — CBC
HCT: 35 % — ABNORMAL LOW (ref 36.0–46.0)
Hemoglobin: 10.8 g/dL — ABNORMAL LOW (ref 12.0–15.0)
MCH: 26.3 pg (ref 26.0–34.0)
MCHC: 30.9 g/dL (ref 30.0–36.0)
MCV: 85.4 fL (ref 80.0–100.0)
Platelets: 93 K/uL — ABNORMAL LOW (ref 150–400)
RBC: 4.1 MIL/uL (ref 3.87–5.11)
RDW: 16.8 % — ABNORMAL HIGH (ref 11.5–15.5)
WBC: 8.3 K/uL (ref 4.0–10.5)
nRBC: 0 % (ref 0.0–0.2)

## 2024-10-19 MED ORDER — IPRATROPIUM-ALBUTEROL 0.5-2.5 (3) MG/3ML IN SOLN: 3.0000 mL | Freq: Four times a day (QID) | RESPIRATORY_TRACT | Status: DC | PRN

## 2024-10-19 NOTE — Progress Notes (Signed)
 Physical Therapy Treatment Patient Details Name: [name] MRN: [id number] DOB: 19-Oct-1945 Today's Date: 10/19/2024   History of Present Illness [name] is a 79 y.o. female transferred to this hospital on -10/19/24 for CT cardiac TAVR protocol. PMH significant for paroxysmal A-fib on Eliquis , chronic HFpEF, prior CVA, carotid artery stenosis, valvular heart disease, CKD 3B, hypertension, hyperlipidemia, type 2 diabetes, hypothyroidism, COPD.    PT Comments  Patient met in supine, agreeable to participate in PT despite reports of abdominal discomfort and fatigue. Patient currently requires min-modA for all functional mobility. Mildly lethargic today with increased time for responses. Limited in OOB tolerance by fatigue. Remained on 1L/min of O2 via nasal cannula, maintaining > 93% spO2. Patient let seated upright in chair end of session with LE elevated and needs in reach. Patient demonstrates overall deconditioning and poor activity tolerance, continue to recommend post-acute rehab < 3 hours/day however patient may progress home.     If plan is discharge home, recommend the following: A little help with walking and/or transfers;A little help with bathing/dressing/bathroom;Help with stairs or ramp for entrance;Assist for transportation;Assistance with cooking/housework;Direct supervision/assist for medications management   Can travel by private vehicle     Yes  Equipment Recommendations  None recommended by PT    Recommendations for Other Services       Precautions / Restrictions Precautions Precautions: Fall Recall of Precautions/Restrictions: Intact Restrictions Weight Bearing Restrictions Per Provider Order: No     Mobility  Bed Mobility Overal bed mobility: Needs Assistance Bed Mobility: Sit to Supine, Supine to Sit     Supine to sit: Min assist Sit to supine: Min assist   General bed mobility comments: assist for trunk righting and for assisting B LE back into  bed.    Transfers Overall transfer level: Needs assistance Equipment used: Rolling walker (2 wheels) Transfers: Sit to/from Stand, Bed to chair/wheelchair/BSC Sit to Stand: Min assist   Step pivot transfers: Mod assist       General transfer comment: Stand pivot transfer EOB to chair with modA, HHA. Sit to stand from EOB with minA to 2WW    Ambulation/Gait Ambulation/Gait assistance: Min assist Gait Distance (Feet): 6 Feet (6 feet x1 fwd/ back; 3 feet x2 lateral stepping at EOB) Assistive device: Rolling walker (2 wheels) Gait Pattern/deviations: Decreased step length - right, Decreased step length - left, Decreased stride length       General Gait Details: increased time   Stairs             Wheelchair Mobility     Tilt Bed    Modified Rankin (Stroke Patients Only)       Balance Overall balance assessment: Needs assistance Sitting-balance support: Feet supported, No upper extremity supported Sitting balance-Leahy Scale: Fair Sitting balance - Comments: supervision at EOB   Standing balance support: Reliant on assistive device for balance, During functional activity, Bilateral upper extremity supported Standing balance-Leahy Scale: Poor Standing balance comment: Requires UE support for stability with step taking. Static standing, can maintain with CGA                            Communication Communication Communication: No apparent difficulties  Cognition Arousal: Lethargic Behavior During Therapy: WFL for tasks assessed/performed   PT - Cognitive impairments: No apparent impairments                         Following commands:  Intact      Cueing Cueing Techniques: Verbal cues, Tactile cues  Exercises      General Comments        Pertinent Vitals/Pain Pain Assessment Pain Assessment: 0-10 Pain Score: 3  Pain Location: abdomen Pain Descriptors / Indicators: Discomfort Pain Intervention(s): Limited activity within  patient's tolerance    Home Living                          Prior Function            PT Goals (current goals can now be found in the care plan section) Progress towards PT goals: Progressing toward goals    Frequency    Min 2X/week      PT Plan      Co-evaluation              AM-PAC PT 6 Clicks Mobility   Outcome Measure  Help needed turning from your back to your side while in a flat bed without using bedrails?: A Little Help needed moving from lying on your back to sitting on the side of a flat bed without using bedrails?: A Little Help needed moving to and from a bed to a chair (including a wheelchair)?: A Little Help needed standing up from a chair using your arms (e.g., wheelchair or bedside chair)?: A Little Help needed to walk in hospital room?: A Little Help needed climbing 3-5 steps with a railing? : Total 6 Click Score: 16    End of Session Equipment Utilized During Treatment: Oxygen ;Gait belt Activity Tolerance: Patient tolerated treatment well;Patient limited by fatigue Patient left: in chair;with chair alarm set;with family/visitor present BANKER notified) Nurse Communication: Mobility status PT Visit Diagnosis: Unsteadiness on feet (R26.81);Other abnormalities of gait and mobility (R26.89);Muscle weakness (generalized) (M62.81)     Time: 8968-8890 PT Time Calculation (min) (ACUTE ONLY): 38 min  Charges:    $Gait Training: 8-22 mins $Therapeutic Activity: 23-37 mins PT General Charges $$ ACUTE PT VISIT: 1 Visit                     Sherryle Hutchinson, PT, DPT [facility] Acute Rehabilitation Office: [phone]   Sherryle VEAR [place] 10/19/2024, 12:31 PM

## 2024-10-19 NOTE — Plan of Care (Signed)

## 2024-10-19 NOTE — Progress Notes (Addendum)
 " PROGRESS NOTE    [name]  FMW:[id number] DOB: [birth year] DOA: 10/08/2024 PCP: Sara Carwin, MD   Brief Narrative:  79 y.o. female with medical history significant for paroxysmal A-fib on Eliquis , chronic HFpEF, prior CVA, carotid artery stenosis, valvular heart disease, CKD 3B, hypertension, hyperlipidemia, type 2 diabetes, hypothyroidism, COPD, bioprosthetic aortic and mitral valve 07/18/2013 presenting with shortness of breath that began on the evening of 10/09/2023.  She has noted some lower extremity edema.  She was admitted with acute hypoxemic respiratory failure in the setting of acute on chronic HFpEF with new severe RV dysfunction.  She is currently being diuresed with IV Lasix  and cardiology is now following with recommendations for VQ scan and possible need for heart catheterization during this admission.  She is also noted to have E. coli UTI and was started on cefadroxil  with sensitivity noted.   Cardiology following and has repeated echocardiogram on 1/15 with moderate to severe prosthetic MS and AS. Transferred to [name] for CT cardiac TAVR protocol    Assessment & Plan:  Principal Problem:   Acute on chronic diastolic (congestive) heart failure (HCC) Active Problems:   Upper airway cough syndrome in pt with mild cough variant asthma    PAF (paroxysmal atrial fibrillation) (HCC)   Long term current use of amiodarone    Long term (current) use of anticoagulants   Acute combined systolic and diastolic heart failure (HCC)   Severe aortic stenosis   S/P AVR (aortic valve replacement)   Right ventricular failure (HCC)   Acute hypoxic respiratory failure (HCC)    Acute respiratory failure with hypoxia,POA: - Multifactorial including COPD exacerbation and CHF - Stable on 2 L nasal cannula>>96% - 1/8 CT chest--new trace L-pleural eff and small loculated R-effusion; bilateral GGO - 1/13 CXR--slight increase R-pleural effusion.  Chronic interstitial prominence -Continue  diuresis with IV Lasix  40 mg twice daily per cardiology recommendations   Acute systolic and diastolic heart failure with biventricular heart failure in setting of moderate to severe prosthetic MS and AS, NYHA IV - LVEF 45% with severe RV systolic dysfunction noted -Continue IV Lasix  40 mg twice daily with close monitoring of renal function and electrolytes. -VQ scan negative for PE - Repeat 2D echocardiogram showing prosthetic valve degeneration on 1/15 -Transferred to [name] on 1/16 for CT cardiac TAVR protocol per cardiology -Spoke to [name] on 1/17 and plan is to get CT TAVR protocol on Monday.   E coli UTI - Completed course of cefadroxil    CKD stage IIIb - Baseline creatinine 1.1-1.4 - Monitor with diuresis   Paroxysmal atrial fibrillation - Continue apixaban  - continue amiodarone    Elevated troponin - Troponin trend is flat, not consistent with ACS - Troponin 42>>41   Mixed hyperlipidemia - Continue statin   Diabetes mellitus type 2 - NovoLog  sliding scale - 09/12/2024 hemoglobin A1c 6.3 -  elevated CBGs secondary to steroids - novolog  sliding scale   Elevated D-dimer - VQ scan negative for PE  Disposition: She was living at home with her husband. She will likely need SNF placement on discharge.  DVT prophylaxis:  apixaban  (ELIQUIS ) tablet 5 mg     Code Status: Limited: Do not attempt resuscitation (DNR) -DNR-LIMITED -Do Not Intubate/DNI  Family Communication:  husband is at the bedside Status is: Inpatient Remains inpatient appropriate because: Acute CHF, Hypoxia    Subjective:  She feels weak. Husband is present at the bedside. She does have LE swelling. Nasal oxygen  cannula in place. We spoke about  her valvular heart disease and further management plan.  Examination:  General exam: Appears calm and comfortable, nasal oxygen  cannula in place  Respiratory system: Clear to auscultation. Respiratory effort normal. Cardiovascular system: S1 & S2 heard,  RRR. No JVD, murmurs, rubs, gallops or clicks. 2+ bilateral pedal edema. Gastrointestinal system: Abdomen is nondistended, soft and nontender. No organomegaly or masses felt. Normal bowel sounds heard. Central nervous system: Alert and oriented. No focal neurological deficits. Extremities: Symmetric 5 x 5 power. Skin: No rashes, lesions or ulcers      Diet Orders (From admission, onward)     Start     Ordered   10/08/24 2317  Diet heart healthy/carb modified Room service appropriate? Yes; Fluid consistency: Thin  Diet effective now       Question Answer Comment  Diet-HS Snack? Nothing   Room service appropriate? Yes   Fluid consistency: Thin      10/08/24 2316            Objective: Vitals:   10/18/24 2128 10/18/24 2242 10/19/24 0424 10/19/24 0433  BP: 126/62 129/77 136/74   Pulse: 81 84 73   Resp: 17 16 16    Temp: 98.4 F (36.9 C) 98 F (36.7 C) 97.8 F (36.6 C)   TempSrc: Oral Oral Oral   SpO2: 95% 96% 100%   Weight:    67.6 kg  Height:        Intake/Output Summary (Last 24 hours) at 10/19/2024 0930 Last data filed at 10/18/2024 2000 Gross per 24 hour  Intake 120 ml  Output 700 ml  Net -580 ml   Filed Weights   10/17/24 0259 10/18/24 0335 10/19/24 0433  Weight: 64.8 kg 65.4 kg 67.6 kg    Scheduled Meds:  amiodarone   200 mg Oral Daily   apixaban   5 mg Oral BID   atorvastatin   40 mg Oral Daily   budesonide -glycopyrrolate -formoterol   2 puff Inhalation BID   feeding supplement  237 mL Oral BID BM   furosemide   40 mg Intravenous BID   HYDROcodone  bit-homatropine  5 mL Oral QHS   insulin  aspart  0-5 Units Subcutaneous QHS   insulin  aspart  0-9 Units Subcutaneous TID WC   levothyroxine   100 mcg Oral QAC breakfast   lidocaine  HCl (PF)  10 mL Intradermal Once   pantoprazole   40 mg Oral QAC breakfast   potassium chloride   40 mEq Oral BID   Continuous Infusions:  Nutritional status     Body mass index is 27.26 kg/m.  Data Reviewed:   CBC: Recent  Labs  Lab 10/13/24 0545 10/17/24 0406 10/18/24 0527  WBC 8.3 8.1 7.8  HGB 10.4* 10.2* 10.0*  HCT 33.2* 34.2* 33.3*  MCV 85.6 87.5 86.0  PLT 128* 111* 104*   Basic Metabolic Panel: Recent Labs  Lab 10/14/24 0446 10/15/24 0529 10/16/24 0517 10/17/24 0406 10/18/24 0527  NA 134* 137 136 136 135  K 3.2* 3.0* 3.7 3.0* 3.6  CL 92* 95* 94* 94* 94*  CO2 33* 36* 29 28 30   GLUCOSE 125* 93 148* 106* 92  BUN 24* 21 18 14 14   CREATININE 1.09* 0.95 1.15* 1.04* 1.04*  CALCIUM  8.5* 8.1* 8.5* 8.3* 8.3*  MG 2.0 1.8 1.7 1.7 2.1   GFR: Estimated Creatinine Clearance: 38.9 mL/min (A) (by C-G formula based on SCr of 1.04 mg/dL (H)). Liver Function Tests: Recent Labs  Lab 10/16/24 0517  AST 35  ALT 43  ALKPHOS 73  BILITOT 1.0  PROT 6.6  ALBUMIN  3.5  No results for input(s): LIPASE, AMYLASE in the last 168 hours. No results for input(s): AMMONIA in the last 168 hours. Coagulation Profile: No results for input(s): INR, PROTIME in the last 168 hours. Cardiac Enzymes: No results for input(s): CKTOTAL, CKMB, CKMBINDEX, TROPONINI in the last 168 hours. BNP (last 3 results) Recent Labs    07/24/24 0438 09/11/24 1824 10/08/24 2147  PROBNP 2,155.0* 5,666.0* 5,574.0*   HbA1C: No results for input(s): HGBA1C in the last 72 hours. CBG: Recent Labs  Lab 10/18/24 1117 10/18/24 1612 10/18/24 1926 10/18/24 2257 10/19/24 0801  GLUCAP 238* 182* 133* 173* 219*   Lipid Profile: No results for input(s): CHOL, HDL, LDLCALC, TRIG, CHOLHDL, LDLDIRECT in the last 72 hours. Thyroid  Function Tests: No results for input(s): TSH, T4TOTAL, FREET4, T3FREE, THYROIDAB in the last 72 hours. Anemia Panel: No results for input(s): VITAMINB12, FOLATE, FERRITIN, TIBC, IRON, RETICCTPCT in the last 72 hours. Sepsis Labs: No results for input(s): PROCALCITON, LATICACIDVEN in the last 168 hours.  Recent Results (from the past 240 hours)  Resp  panel by RT-PCR (RSV, Flu A&B, Covid) Anterior Nasal Swab     Status: None   Collection Time: 10/09/24 12:05 PM   Specimen: Anterior Nasal Swab  Result Value Ref Range Status   SARS Coronavirus 2 by RT PCR NEGATIVE NEGATIVE Final    Comment: (NOTE) SARS-CoV-2 target nucleic acids are NOT DETECTED.  The SARS-CoV-2 RNA is generally detectable in upper respiratory specimens during the acute phase of infection. The lowest concentration of SARS-CoV-2 viral copies this assay can detect is 138 copies/mL. A negative result does not preclude SARS-Cov-2 infection and should not be used as the sole basis for treatment or other patient management decisions. A negative result may occur with  improper specimen collection/handling, submission of specimen other than nasopharyngeal swab, presence of viral mutation(s) within the areas targeted by this assay, and inadequate number of viral copies(<138 copies/mL). A negative result must be combined with clinical observations, patient history, and epidemiological information. The expected result is Negative.  Fact Sheet for Patients:  bloggercourse.com  Fact Sheet for Healthcare Providers:  seriousbroker.it  This test is no t yet approved or cleared by the [place]  FDA and  has been authorized for detection and/or diagnosis of SARS-CoV-2 by FDA under an Emergency Use Authorization (EUA). This EUA will remain  in effect (meaning this test can be used) for the duration of the COVID-19 declaration under Section 564(b)(1) of the Act, 21 U.S.C.section 360bbb-3(b)(1), unless the authorization is terminated  or revoked sooner.       Influenza A by PCR NEGATIVE NEGATIVE Final   Influenza B by PCR NEGATIVE NEGATIVE Final    Comment: (NOTE) The Xpert Xpress SARS-CoV-2/FLU/RSV plus assay is intended as an aid in the diagnosis of influenza from Nasopharyngeal swab specimens and should not be used as a  sole basis for treatment. Nasal washings and aspirates are unacceptable for Xpert Xpress SARS-CoV-2/FLU/RSV testing.  Fact Sheet for Patients: bloggercourse.com  Fact Sheet for Healthcare Providers: seriousbroker.it  This test is not yet approved or cleared by the [place]  FDA and has been authorized for detection and/or diagnosis of SARS-CoV-2 by FDA under an Emergency Use Authorization (EUA). This EUA will remain in effect (meaning this test can be used) for the duration of the COVID-19 declaration under Section 564(b)(1) of the Act, 21 U.S.C. section 360bbb-3(b)(1), unless the authorization is terminated or revoked.     Resp Syncytial Virus by PCR NEGATIVE NEGATIVE Final  Comment: (NOTE) Fact Sheet for Patients: bloggercourse.com  Fact Sheet for Healthcare Providers: seriousbroker.it  This test is not yet approved or cleared by the [place]  FDA and has been authorized for detection and/or diagnosis of SARS-CoV-2 by FDA under an Emergency Use Authorization (EUA). This EUA will remain in effect (meaning this test can be used) for the duration of the COVID-19 declaration under Section 564(b)(1) of the Act, 21 U.S.C. section 360bbb-3(b)(1), unless the authorization is terminated or revoked.  Performed at [name], [address] [place], [place] [address]   Respiratory (~20 pathogens) panel by PCR     Status: None   Collection Time: 10/09/24  1:15 PM   Specimen: Nasopharyngeal Swab; Respiratory  Result Value Ref Range Status   Adenovirus NOT DETECTED NOT DETECTED Final   Coronavirus 229E NOT DETECTED NOT DETECTED Final    Comment: (NOTE) The Coronavirus on the Respiratory Panel, DOES NOT test for the novel  Coronavirus (2019 nCoV)    Coronavirus HKU1 NOT DETECTED NOT DETECTED Final   Coronavirus NL63 NOT DETECTED NOT DETECTED Final   Coronavirus OC43 NOT  DETECTED NOT DETECTED Final   Metapneumovirus NOT DETECTED NOT DETECTED Final   Rhinovirus / Enterovirus NOT DETECTED NOT DETECTED Final   Influenza A NOT DETECTED NOT DETECTED Final   Influenza B NOT DETECTED NOT DETECTED Final   Parainfluenza Virus 1 NOT DETECTED NOT DETECTED Final   Parainfluenza Virus 2 NOT DETECTED NOT DETECTED Final   Parainfluenza Virus 3 NOT DETECTED NOT DETECTED Final   Parainfluenza Virus 4 NOT DETECTED NOT DETECTED Final   Respiratory Syncytial Virus NOT DETECTED NOT DETECTED Final   Bordetella pertussis NOT DETECTED NOT DETECTED Final   Bordetella Parapertussis NOT DETECTED NOT DETECTED Final   Chlamydophila pneumoniae NOT DETECTED NOT DETECTED Final   Mycoplasma pneumoniae NOT DETECTED NOT DETECTED Final    Comment: Performed at [facility], [address], [place], [place] [address]  Urine Culture (for pregnant, neutropenic or urologic patients or patients with an indwelling urinary catheter)     Status: Abnormal   Collection Time: 10/12/24  2:02 PM   Specimen: Urine, Catheterized  Result Value Ref Range Status   Specimen Description   Final    URINE, CATHETERIZED Performed at [name], [address] [place], [place] [address]    Special Requests   Final    NONE Performed at [name], [address] [place], [place] [address]    Culture 60,000 COLONIES/mL ESCHERICHIA COLI (A)  Final   Report Status 10/15/2024 FINAL  Final   Organism ID, Bacteria ESCHERICHIA COLI (A)  Final      Susceptibility   Escherichia coli - MIC*    AMPICILLIN  >=32 RESISTANT Resistant     CEFAZOLIN  (URINE) Value in next row Sensitive      4 SENSITIVEThis is a modified FDA-approved test that has been validated and its performance characteristics determined by the reporting laboratory.  This laboratory is certified under the Clinical Laboratory Improvement Amendments CLIA as qualified to perform high complexity clinical laboratory testing.    CEFEPIME Value in next  row Sensitive      4 SENSITIVEThis is a modified FDA-approved test that has been validated and its performance characteristics determined by the reporting laboratory.  This laboratory is certified under the Clinical Laboratory Improvement Amendments CLIA as qualified to perform high complexity clinical laboratory testing.    ERTAPENEM Value in next row Sensitive      4 SENSITIVEThis is a  modified FDA-approved test that has been validated and its performance characteristics determined by the reporting laboratory.  This laboratory is certified under the Clinical Laboratory Improvement Amendments CLIA as qualified to perform high complexity clinical laboratory testing.    CEFTRIAXONE  Value in next row Sensitive      4 SENSITIVEThis is a modified FDA-approved test that has been validated and its performance characteristics determined by the reporting laboratory.  This laboratory is certified under the Clinical Laboratory Improvement Amendments CLIA as qualified to perform high complexity clinical laboratory testing.    CIPROFLOXACIN Value in next row Sensitive      4 SENSITIVEThis is a modified FDA-approved test that has been validated and its performance characteristics determined by the reporting laboratory.  This laboratory is certified under the Clinical Laboratory Improvement Amendments CLIA as qualified to perform high complexity clinical laboratory testing.    GENTAMICIN Value in next row Sensitive      4 SENSITIVEThis is a modified FDA-approved test that has been validated and its performance characteristics determined by the reporting laboratory.  This laboratory is certified under the Clinical Laboratory Improvement Amendments CLIA as qualified to perform high complexity clinical laboratory testing.    NITROFURANTOIN  Value in next row Sensitive      4 SENSITIVEThis is a modified FDA-approved test that has been validated and its performance characteristics determined by the reporting laboratory.   This laboratory is certified under the Clinical Laboratory Improvement Amendments CLIA as qualified to perform high complexity clinical laboratory testing.    TRIMETH/SULFA Value in next row Sensitive      4 SENSITIVEThis is a modified FDA-approved test that has been validated and its performance characteristics determined by the reporting laboratory.  This laboratory is certified under the Clinical Laboratory Improvement Amendments CLIA as qualified to perform high complexity clinical laboratory testing.    AMPICILLIN /SULBACTAM Value in next row Resistant      4 SENSITIVEThis is a modified FDA-approved test that has been validated and its performance characteristics determined by the reporting laboratory.  This laboratory is certified under the Clinical Laboratory Improvement Amendments CLIA as qualified to perform high complexity clinical laboratory testing.    PIP/TAZO Value in next row Sensitive      <=4 SENSITIVEThis is a modified FDA-approved test that has been validated and its performance characteristics determined by the reporting laboratory.  This laboratory is certified under the Clinical Laboratory Improvement Amendments CLIA as qualified to perform high complexity clinical laboratory testing.    MEROPENEM Value in next row Sensitive      <=4 SENSITIVEThis is a modified FDA-approved test that has been validated and its performance characteristics determined by the reporting laboratory.  This laboratory is certified under the Clinical Laboratory Improvement Amendments CLIA as qualified to perform high complexity clinical laboratory testing.    * 60,000 COLONIES/mL ESCHERICHIA COLI         Radiology Studies: ECHOCARDIOGRAM LIMITED Result Date: 10/17/2024    ECHOCARDIOGRAM LIMITED REPORT   Patient Name:   [name] Date of Exam: 10/17/2024 Medical Rec #:  [id number]       Height:       62.0 in Accession #:    [id number]      Weight:       142.9 lb Date of Birth:  November 21, 1945       BSA:           1.657 m Patient Age:    78 years        BP:  120/75 mmHg Patient Gender: F               HR:           69 bpm. Exam Location:  [facility] Procedure: Limited Echo, Cardiac Doppler and Limited Color Doppler (Both            Spectral and Color Flow Doppler were utilized during procedure). Indications:    S/P Aortic Valve replacement Z95.2  History:        Patient has prior history of Echocardiogram examinations, most                 recent 10/15/2024. CHF, Stroke, Arrythmias:Atrial Fibrillation;                 Risk Factors:Hypertension, Diabetes and Dyslipidemia. Aortic                 Valve: 21 mm Magna bioprosthetic valve is present                 in the aortic position. Procedure Date: 07/18/2013. Mitral                 Valve: 25 mm Manga Ease bioprosthetic valve valve is present in                 the mitral position. Procedure Date: 07/18/13.                  Aortic Valve: 21 mm Magna Ease bioprosthetic valve is present in                 the aortic position.                 Mitral Valve: 25 mm Magna Ease bioprosthetic valve valve is                 present in the mitral position.  Sonographer:    Aida Pizza RCS Referring Phys: [id number] VISHNU P MALLIPEDDI IMPRESSIONS  1. Limited study.  2. Left ventricular ejection fraction, by estimation, is 60 to 65%. The left ventricle has normal function. The left ventricle has no regional wall motion abnormalities. There is the interventricular septum is flattened in systole and diastole, consistent with right ventricular pressure and volume overload.  3. RVSP not estimated. Right ventricular systolic function is severely reduced. The right ventricular size is moderately enlarged.  4. Papillary muscle calcification noted. Trivial mitral valve regurgitation. Moderate to severe mitral stenosis. The mean mitral valve gradient is 10.0 mmHg. There is a 25 mm Magna Ease bioprosthetic valve present in the mitral position.  5. There is mild calcification of the  prosthetic aortic valve. Aortic valve regurgitation is at least mild, not clear that PHT is accurate and jet somewhat obscured by transmitral flow jet. Moderate to severe aortic valve stenosis. There is a 21 mm Magna Ease bioprosthetic valve present in the aortic position. Aortic valve mean gradient measures 21.3 mmHg. Dimentionless index 0.25. Comparison(s): If clinically reasonable, consider TEE for further evaluation of prosthetic valvular function. Both mitral and aortic mean gradients have increased compared with prior complete study. FINDINGS  Left Ventricle: Left ventricular ejection fraction, by estimation, is 60 to 65%. The left ventricle has normal function. The left ventricle has no regional wall motion abnormalities. The left ventricular internal cavity size was normal in size. There is  no left ventricular hypertrophy. The interventricular septum is flattened in systole and diastole, consistent with right ventricular pressure and volume  overload. Right Ventricle: RVSP not estimated. The right ventricular size is moderately enlarged. No increase in right ventricular wall thickness. Right ventricular systolic function is severely reduced. Mitral Valve: Papillary muscle calcification noted. Trivial mitral valve regurgitation. There is a 25 mm Magna Ease bioprosthetic valve present in the mitral position. Moderate to severe mitral valve stenosis. MV peak gradient, 27.9 mmHg. The mean mitral  valve gradient is 10.0 mmHg. Aortic Valve: There is mild calcification of the aortic valve. Aortic valve regurgitation is mild. Aortic regurgitation PHT measures 175 msec. Moderate to severe aortic stenosis is present. Aortic valve mean gradient measures 21.3 mmHg. Aortic valve peak  gradient measures 37.6 mmHg. Aortic valve area, by VTI measures 0.44 cm. There is a 21 mm Magna Ease bioprosthetic valve present in the aortic position. Pulmonic Valve: The pulmonic valve was grossly normal. Pulmonic valve regurgitation is  trivial. Aorta: The aortic root is normal in size and structure. LEFT VENTRICLE PLAX 2D LVIDd:         3.70 cm LVIDs:         2.50 cm LV PW:         1.00 cm LV IVS:        0.80 cm LVOT diam:     1.50 cm LV SV:         29 LV SV Index:   17 LVOT Area:     1.77 cm  LEFT ATRIUM         Index LA diam:    3.90 cm 2.35 cm/m  AORTIC VALVE AV Area (Vmax):    0.47 cm AV Area (Vmean):   0.49 cm AV Area (VTI):     0.44 cm AV Vmax:           306.50 cm/s AV Vmean:          214.333 cm/s AV VTI:            0.652 m AV Peak Grad:      37.6 mmHg AV Mean Grad:      21.3 mmHg LVOT Vmax:         81.17 cm/s LVOT Vmean:        59.367 cm/s LVOT VTI:          0.162 m LVOT/AV VTI ratio: 0.25 AI PHT:            175 msec  AORTA Ao Root diam: 2.60 cm MITRAL VALVE MV Area (PHT): 1.14 cm     SHUNTS MV Area VTI:   0.38 cm     Systemic VTI:  0.16 m MV Peak grad:  27.9 mmHg    Systemic Diam: 1.50 cm MV Mean grad:  10.0 mmHg MV Vmax:       2.64 m/s MV Vmean:      136.0 cm/s MV Decel Time: 665 msec MV E velocity: 254.00 cm/s Jayson Sierras MD Electronically signed by Jayson Sierras MD Signature Date/Time: 10/17/2024/3:51:52 PM    Final            LOS: 11 days   Time spent= 35 mins    Deliliah Room, MD [facility]  If 7PM-7AM, please contact night-coverage  10/19/2024, 9:30 AM  "

## 2024-10-19 NOTE — Progress Notes (Signed)
 "   Progress Note  Patient Name: [name] Date of Encounter: 10/19/2024  Primary Cardiologist: Alvan Carrier, MD  Subjective   Alert and oriented.  Hypoxic on room air.  Still short of breath.  Tachypneic.  Inpatient Medications    Scheduled Meds:  amiodarone   200 mg Oral Daily   apixaban   5 mg Oral BID   atorvastatin   40 mg Oral Daily   budesonide -glycopyrrolate -formoterol   2 puff Inhalation BID   feeding supplement  237 mL Oral BID BM   furosemide   40 mg Intravenous BID   HYDROcodone  bit-homatropine  5 mL Oral QHS   insulin  aspart  0-5 Units Subcutaneous QHS   insulin  aspart  0-9 Units Subcutaneous TID WC   levothyroxine   100 mcg Oral QAC breakfast   lidocaine  HCl (PF)  10 mL Intradermal Once   pantoprazole   40 mg Oral QAC breakfast   potassium chloride   40 mEq Oral BID   Continuous Infusions:  PRN Meds: acetaminophen , azelastine , guaiFENesin -dextromethorphan , ipratropium-albuterol , meclizine , melatonin, polyethylene glycol, prochlorperazine    Vital Signs    Vitals:   10/18/24 2242 10/19/24 0424 10/19/24 0433 10/19/24 1224  BP: 129/77 136/74    Pulse: 84 73  80  Resp: 16 16    Temp: 98 F (36.7 C) 97.8 F (36.6 C)    TempSrc: Oral Oral    SpO2: 96% 100%  98%  Weight:   67.6 kg   Height:        Intake/Output Summary (Last 24 hours) at 10/19/2024 1241 Last data filed at 10/18/2024 2000 Gross per 24 hour  Intake 120 ml  Output 700 ml  Net -580 ml    Filed Weights   10/17/24 0259 10/18/24 0335 10/19/24 0433  Weight: 64.8 kg 65.4 kg 67.6 kg    Telemetry     Personally reviewed.  NSR  ECG    Not performed today.  Physical Exam   GEN: In mild acute distress.   Neck: Unable to examine Cardiac: RRR, no murmur, rub, or gallop.  Respiratory: Nonlabored. Clear to auscultation bilaterally. GI: Soft, nontender, bowel sounds present. MS: No edema; No deformity. Neuro:  Nonfocal. Psych: Alert and oriented x 3. Normal affect.  Labs     Chemistry Recent Labs  Lab 10/16/24 0517 10/17/24 0406 10/18/24 0527  NA 136 136 135  K 3.7 3.0* 3.6  CL 94* 94* 94*  CO2 29 28 30   GLUCOSE 148* 106* 92  BUN 18 14 14   CREATININE 1.15* 1.04* 1.04*  CALCIUM  8.5* 8.3* 8.3*  PROT 6.6  --   --   ALBUMIN  3.5  --   --   AST 35  --   --   ALT 43  --   --   ALKPHOS 73  --   --   BILITOT 1.0  --   --   GFRNONAA 48* 54* 54*  ANIONGAP 12 14 11      Hematology Recent Labs  Lab 10/13/24 0545 10/17/24 0406 10/18/24 0527  WBC 8.3 8.1 7.8  RBC 3.88 3.91 3.87  HGB 10.4* 10.2* 10.0*  HCT 33.2* 34.2* 33.3*  MCV 85.6 87.5 86.0  MCH 26.8 26.1 25.8*  MCHC 31.3 29.8* 30.0  RDW 16.5* 16.7* 16.6*  PLT 128* 111* 104*    Cardiac EnzymesNo results for input(s): TROPONINIHS in the last 720 hours.  BNPNo results for input(s): BNP, PROBNP in the last 168 hours.   DDimerNo results for input(s): DDIMER in the last 168 hours.    Assessment &  Plan   Acute hypoxic respiratory failure Acute systolic and diastolic diastolic heart failure Right ventricular failure S/p bioprosthetic/bovine AVR and MVR in 2014 - Presented with worsening DOE, orthopnea and bilateral leg swelling x few weeks. - proBNP elevated, 5574. Hs troponins mildly elevated, 42>>41. - Continues to be tachypneic, short of breath and hypoxic on room air.  Maintaining saturations well on 2L nasal cannula oxygen . - Limited echocardiogram this admission showed LVEF 45% and severe RV systolic dysfunction with moderate RV enlargement.  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 These findings are new compared to echocardiogram from October 2025.  Prior echo from October 2025 showed increased gradients across the aortic  prosthetic valve.  Mitral prostatic valve remains normal.  But limited echocardiogram this admission showed increased gradients across both aortic and mitral prosthetic valves which are worse compared to prior echo from October 2025. - Currently on IV Lasix  40 mg twice daily. - Transferred to [facility] for CT cardiac TAVR protocol.  Not a candidate for TEE due to hypoxia.  Currently on 2L nasal cannula oxygen . This is new.  On room air at home.  She probably might need invasive cardiac procedures in the future depending on the CT cardiac TAVR study.   Paroxysmal atrial fibrillation - Continue amiodarone  200 mg once daily and Eliquis  5 mg twice daily.  EKG on admission showed NSR with first-degree AV block, prolonged PR interval, 281 ms.  No dizziness.  40-minute spent in reviewing prior medical records, reports, more than 3 labs, discussion and documentation.  Signed, Diannah SHAUNNA Maywood, MD  10/19/2024, 12:41 PM    "

## 2024-10-20 DIAGNOSIS — J9601 Acute respiratory failure with hypoxia: Secondary | ICD-10-CM | POA: Diagnosis not present

## 2024-10-20 DIAGNOSIS — Z952 Presence of prosthetic heart valve: Secondary | ICD-10-CM | POA: Diagnosis not present

## 2024-10-20 DIAGNOSIS — I5033 Acute on chronic diastolic (congestive) heart failure: Secondary | ICD-10-CM | POA: Diagnosis not present

## 2024-10-20 DIAGNOSIS — I480 Paroxysmal atrial fibrillation: Secondary | ICD-10-CM | POA: Diagnosis not present

## 2024-10-20 DIAGNOSIS — I5041 Acute combined systolic (congestive) and diastolic (congestive) heart failure: Secondary | ICD-10-CM | POA: Diagnosis not present

## 2024-10-20 LAB — CBC
HCT: 36.4 % (ref 36.0–46.0)
Hemoglobin: 11.3 g/dL — ABNORMAL LOW (ref 12.0–15.0)
MCH: 26.6 pg (ref 26.0–34.0)
MCHC: 31 g/dL (ref 30.0–36.0)
MCV: 85.6 fL (ref 80.0–100.0)
Platelets: 87 K/uL — ABNORMAL LOW (ref 150–400)
RBC: 4.25 MIL/uL (ref 3.87–5.11)
RDW: 17.2 % — ABNORMAL HIGH (ref 11.5–15.5)
WBC: 8.1 K/uL (ref 4.0–10.5)
nRBC: 0 % (ref 0.0–0.2)

## 2024-10-20 LAB — BASIC METABOLIC PANEL WITH GFR
Anion gap: 10 (ref 5–15)
BUN: 19 mg/dL (ref 8–23)
CO2: 28 mmol/L (ref 22–32)
Calcium: 9 mg/dL (ref 8.9–10.3)
Chloride: 99 mmol/L (ref 98–111)
Creatinine, Ser: 1.2 mg/dL — ABNORMAL HIGH (ref 0.44–1.00)
GFR, Estimated: 46 mL/min — ABNORMAL LOW
Glucose, Bld: 110 mg/dL — ABNORMAL HIGH (ref 70–99)
Potassium: 4.7 mmol/L (ref 3.5–5.1)
Sodium: 137 mmol/L (ref 135–145)

## 2024-10-20 LAB — GLUCOSE, CAPILLARY
Glucose-Capillary: 131 mg/dL — ABNORMAL HIGH (ref 70–99)
Glucose-Capillary: 230 mg/dL — ABNORMAL HIGH (ref 70–99)
Glucose-Capillary: 121 mg/dL — ABNORMAL HIGH (ref 70–99)
Glucose-Capillary: 216 mg/dL — ABNORMAL HIGH (ref 70–99)

## 2024-10-20 MED ORDER — FUROSEMIDE 10 MG/ML IJ SOLN
20.0000 mg | Freq: Two times a day (BID) | INTRAMUSCULAR | Status: DC
  Administered 2024-10-21 – 2024-10-22 (×3): 20 mg via INTRAVENOUS
  Filled 2024-10-20 (×3): qty 2

## 2024-10-20 MED ORDER — MENTHOL 3 MG MT LOZG
1.0000 | LOZENGE | OROMUCOSAL | Status: DC | PRN
  Administered 2024-10-20 – 2024-10-23 (×4): 3 mg via ORAL
  Filled 2024-10-20: qty 9

## 2024-10-20 NOTE — Progress Notes (Addendum)
 "   Progress Note  Patient Name: [name] Date of Encounter: 10/20/2024  Primary Cardiologist: Alvan Carrier, MD  Subjective   Alert and oriented.  Still short of breath, weak.  Tachypneic.  Inpatient Medications    Scheduled Meds:  amiodarone   200 mg Oral Daily   apixaban   5 mg Oral BID   atorvastatin   40 mg Oral Daily   budesonide -glycopyrrolate -formoterol   2 puff Inhalation BID   feeding supplement  237 mL Oral BID BM   furosemide   40 mg Intravenous BID   HYDROcodone  bit-homatropine  5 mL Oral QHS   insulin  aspart  0-5 Units Subcutaneous QHS   insulin  aspart  0-9 Units Subcutaneous TID WC   levothyroxine   100 mcg Oral QAC breakfast   lidocaine  HCl (PF)  10 mL Intradermal Once   pantoprazole   40 mg Oral QAC breakfast   potassium chloride   40 mEq Oral BID   Continuous Infusions:  PRN Meds: acetaminophen , azelastine , guaiFENesin -dextromethorphan , ipratropium-albuterol , meclizine , melatonin, menthol , polyethylene glycol, prochlorperazine    Vital Signs    Vitals:   10/19/24 2031 10/20/24 0458 10/20/24 0500 10/20/24 0747  BP:  137/64  (!) 140/66  Pulse:  71  68  Resp:  17  17  Temp:  98.3 F (36.8 C)  98 F (36.7 C)  TempSrc:    Oral  SpO2: 95% 100%  99%  Weight:   66 kg   Height:        Intake/Output Summary (Last 24 hours) at 10/20/2024 1059 Last data filed at 10/20/2024 0900 Gross per 24 hour  Intake 680 ml  Output 475 ml  Net 205 ml    Filed Weights   10/18/24 0335 10/19/24 0433 10/20/24 0500  Weight: 65.4 kg 67.6 kg 66 kg    Telemetry     Personally reviewed.  NSR  ECG    Not performed today.  Physical Exam   GEN: In mild acute distress.   Neck: Unable to examine Cardiac: RRR, no murmur, rub, or gallop.  Respiratory: Nonlabored. Clear to auscultation bilaterally. GI: Soft, nontender, bowel sounds present. MS: No edema; No deformity. Neuro:  Nonfocal. Psych: Alert and oriented x 3. Normal affect.  Labs    Chemistry Recent  Labs  Lab 10/16/24 0517 10/17/24 0406 10/18/24 0527 10/20/24 0344  NA 136 136 135 137  K 3.7 3.0* 3.6 4.7  CL 94* 94* 94* 99  CO2 29 28 30 28   GLUCOSE 148* 106* 92 110*  BUN 18 14 14 19   CREATININE 1.15* 1.04* 1.04* 1.20*  CALCIUM  8.5* 8.3* 8.3* 9.0  PROT 6.6  --   --   --   ALBUMIN  3.5  --   --   --   AST 35  --   --   --   ALT 43  --   --   --   ALKPHOS 73  --   --   --   BILITOT 1.0  --   --   --   GFRNONAA 48* 54* 54* 46*  ANIONGAP 12 14 11 10      Hematology Recent Labs  Lab 10/17/24 0406 10/18/24 0527 10/20/24 0344  WBC 8.1 7.8 8.1  RBC 3.91 3.87 4.25  HGB 10.2* 10.0* 11.3*  HCT 34.2* 33.3* 36.4  MCV 87.5 86.0 85.6  MCH 26.1 25.8* 26.6  MCHC 29.8* 30.0 31.0  RDW 16.7* 16.6* 17.2*  PLT 111* 104* 87*    Cardiac EnzymesNo results for input(s): TROPONINIHS in the last 720 hours.  BNPNo results for input(s): BNP, PROBNP in the last 168 hours.   DDimerNo results for input(s): DDIMER in the last 168 hours.    Assessment & Plan   Acute hypoxic respiratory failure Acute systolic and diastolic diastolic heart failure Right ventricular failure S/p bioprosthetic/bovine AVR and MVR in 2014 - Presented with worsening DOE, orthopnea and bilateral leg swelling x few weeks. - proBNP elevated, 5574. Hs troponins mildly elevated, 42>>41. - Continues to be tachypneic, short of breath.Maintaining saturations well on 1L nasal cannula oxygen . - Limited echocardiogram this admission showed LVEF 45% and severe RV systolic dysfunction with moderate RV enlargement.  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 These findings are new compared to echocardiogram from October 2025.  Prior echo from October 2025 showed increased  gradients across the aortic prosthetic valve.  Mitral prostatic valve remains normal.  But limited echocardiogram this admission showed increased gradients across both aortic and mitral prosthetic valves which are worse compared to prior echo from October 2025. - Currently on IV Lasix  40 mg twice daily, cut back to IV Lasix  20 mg BID.  - Transferred to [facility] for CT cardiac TAVR protocol.  Not a candidate for TEE due to hypoxia.  Currently on 1L nasal cannula oxygen . This is new.  On room air at home.  She probably might need invasive cardiac procedures in the future depending on the CT cardiac TAVR study.   Paroxysmal atrial fibrillation - Continue amiodarone  200 mg once daily and Eliquis  5 mg twice daily.  EKG on admission showed NSR with first-degree AV block, prolonged PR interval, 281 ms.  No dizziness.  30-minute spent in reviewing prior medical records, reports, more than 3 labs, discussion and documentation.  Signed, Diannah SHAUNNA Maywood, MD  10/20/2024, 10:59 AM    "

## 2024-10-20 NOTE — Plan of Care (Signed)

## 2024-10-20 NOTE — Progress Notes (Signed)
 " PROGRESS NOTE    [name]  FMW:[id number] DOB: [birth year] DOA: 10/08/2024 PCP: Sheryle Carwin, MD   Brief Narrative:  79 y.o. female with medical history significant for paroxysmal A-fib on Eliquis , chronic HFpEF, prior CVA, carotid artery stenosis, valvular heart disease, CKD 3B, hypertension, hyperlipidemia, type 2 diabetes, hypothyroidism, COPD, bioprosthetic aortic and mitral valve 07/18/2013 presenting with shortness of breath that began on the evening of 10/09/2023.  She has noted some lower extremity edema.  She was admitted with acute hypoxemic respiratory failure in the setting of acute on chronic HFpEF with new severe RV dysfunction.  She is currently being diuresed with IV Lasix  and cardiology is now following with recommendations for VQ scan and possible need for heart catheterization during this admission.  She is also noted to have E. coli UTI and was started on cefadroxil  with sensitivity noted.   Cardiology following and has repeated echocardiogram on 1/15 with moderate to severe prosthetic MS and AS.  Transferred to [name] for CT cardiac TAVR protocol, to be done on 1/19.   Assessment & Plan:  Principal Problem:   Acute on chronic diastolic (congestive) heart failure (HCC) Active Problems:   Upper airway cough syndrome in pt with mild cough variant asthma    PAF (paroxysmal atrial fibrillation) (HCC)   Long term current use of amiodarone    Long term (current) use of anticoagulants   Acute combined systolic and diastolic heart failure (HCC)   Severe aortic stenosis   S/P AVR (aortic valve replacement)   Right ventricular failure (HCC)   Acute hypoxic respiratory failure (HCC)   S/P MVR (mitral valve repair)   S/P AVR    Acute respiratory failure with hypoxia,POA: - Multifactorial including COPD exacerbation and CHF - Stable on 2 L nasal cannula>>96% - 1/8 CT chest--new trace L-pleural eff and small loculated R-effusion; bilateral GGO - 1/13 CXR--slight increase  R-pleural effusion.  Chronic interstitial prominence -Continue diuresis with IV Lasix  40 mg twice daily per cardiology recommendations   Acute systolic and diastolic heart failure with biventricular heart failure in setting of moderate to severe prosthetic MS and AS, NYHA IV - LVEF 45% with severe RV systolic dysfunction noted -Continue IV Lasix  40 mg twice daily with close monitoring of renal function and electrolytes. -VQ scan negative for PE - Repeat 2D echocardiogram showing prosthetic valve degeneration on 1/15 -Transferred to [facility] on 1/16 for CT cardiac TAVR protocol per cardiology -Spoke to Dr Stacia on 1/17 and plan is to get CT TAVR protocol on Monday.   E coli UTI - Completed course of cefadroxil    CKD stage IIIb - Baseline creatinine 1.1-1.4 - Monitor with diuresis   Paroxysmal atrial fibrillation - Continue apixaban  - continue amiodarone    Elevated troponin - Troponin trend is flat, not consistent with ACS - Troponin 42>>41   Mixed hyperlipidemia - Continue statin   Diabetes mellitus type 2 - NovoLog  sliding scale - 09/12/2024 hemoglobin A1c 6.3 -  elevated CBGs secondary to steroids - novolog  sliding scale   Elevated D-dimer - VQ scan negative for PE  Disposition: She was living at home with her husband. She will likely need SNF placement on discharge.  DVT prophylaxis:  apixaban  (ELIQUIS ) tablet 5 mg     Code Status: Limited: Do not attempt resuscitation (DNR) -DNR-LIMITED -Do Not Intubate/DNI  Family Communication:  husband and daughter are at the bedside Status is: Inpatient Remains inpatient appropriate because: Acute CHF, Hypoxia    Subjective:  She didn't have a  good night. Feels weak, tired and short of breath. Husband and daughter are present at the bedside. We discussed about getting CT cardiac TAVR protocol tomorrow and will decide the next course of action.   Examination:  General exam: Appears calm and comfortable, nasal oxygen   cannula in place  Respiratory system: Clear to auscultation. Respiratory effort normal. Cardiovascular system: S1 & S2 heard, RRR. No JVD, murmurs, rubs, gallops or clicks. 2+ bilateral pedal edema. Gastrointestinal system: Abdomen is nondistended, soft and nontender. No organomegaly or masses felt. Normal bowel sounds heard. Central nervous system: Alert and oriented. No focal neurological deficits. Extremities: Symmetric 5 x 5 power. Skin: No rashes, lesions or ulcers      Diet Orders (From admission, onward)     Start     Ordered   10/08/24 2317  Diet heart healthy/carb modified Room service appropriate? Yes; Fluid consistency: Thin  Diet effective now       Question Answer Comment  Diet-HS Snack? Nothing   Room service appropriate? Yes   Fluid consistency: Thin      10/08/24 2316            Objective: Vitals:   10/19/24 2031 10/20/24 0458 10/20/24 0500 10/20/24 0747  BP:  137/64  (!) 140/66  Pulse:  71  68  Resp:  17  17  Temp:  98.3 F (36.8 C)  98 F (36.7 C)  TempSrc:    Oral  SpO2: 95% 100%  99%  Weight:   66 kg   Height:        Intake/Output Summary (Last 24 hours) at 10/20/2024 0932 Last data filed at 10/20/2024 0900 Gross per 24 hour  Intake 680 ml  Output 475 ml  Net 205 ml   Filed Weights   10/18/24 0335 10/19/24 0433 10/20/24 0500  Weight: 65.4 kg 67.6 kg 66 kg    Scheduled Meds:  amiodarone   200 mg Oral Daily   apixaban   5 mg Oral BID   atorvastatin   40 mg Oral Daily   budesonide -glycopyrrolate -formoterol   2 puff Inhalation BID   feeding supplement  237 mL Oral BID BM   furosemide   40 mg Intravenous BID   HYDROcodone  bit-homatropine  5 mL Oral QHS   insulin  aspart  0-5 Units Subcutaneous QHS   insulin  aspart  0-9 Units Subcutaneous TID WC   levothyroxine   100 mcg Oral QAC breakfast   lidocaine  HCl (PF)  10 mL Intradermal Once   pantoprazole   40 mg Oral QAC breakfast   potassium chloride   40 mEq Oral BID   Continuous  Infusions:  Nutritional status     Body mass index is 26.61 kg/m.  Data Reviewed:   CBC: Recent Labs  Lab 10/17/24 0406 10/18/24 0527 10/20/24 0344  WBC 8.1 7.8 8.1  HGB 10.2* 10.0* 11.3*  HCT 34.2* 33.3* 36.4  MCV 87.5 86.0 85.6  PLT 111* 104* 87*   Basic Metabolic Panel: Recent Labs  Lab 10/14/24 0446 10/15/24 0529 10/16/24 0517 10/17/24 0406 10/18/24 0527 10/20/24 0344  NA 134* 137 136 136 135 137  K 3.2* 3.0* 3.7 3.0* 3.6 4.7  CL 92* 95* 94* 94* 94* 99  CO2 33* 36* 29 28 30 28   GLUCOSE 125* 93 148* 106* 92 110*  BUN 24* 21 18 14 14 19   CREATININE 1.09* 0.95 1.15* 1.04* 1.04* 1.20*  CALCIUM  8.5* 8.1* 8.5* 8.3* 8.3* 9.0  MG 2.0 1.8 1.7 1.7 2.1  --    GFR: Estimated Creatinine Clearance: 33.4 mL/min (  A) (by C-G formula based on SCr of 1.2 mg/dL (H)). Liver Function Tests: Recent Labs  Lab 10/16/24 0517  AST 35  ALT 43  ALKPHOS 73  BILITOT 1.0  PROT 6.6  ALBUMIN  3.5   No results for input(s): LIPASE, AMYLASE in the last 168 hours. No results for input(s): AMMONIA in the last 168 hours. Coagulation Profile: No results for input(s): INR, PROTIME in the last 168 hours. Cardiac Enzymes: No results for input(s): CKTOTAL, CKMB, CKMBINDEX, TROPONINI in the last 168 hours. BNP (last 3 results) Recent Labs    07/24/24 0438 09/11/24 1824 10/08/24 2147  PROBNP 2,155.0* 5,666.0* 5,574.0*   HbA1C: No results for input(s): HGBA1C in the last 72 hours. CBG: Recent Labs  Lab 10/19/24 0801 10/19/24 1137 10/19/24 1624 10/19/24 2118 10/20/24 0752  GLUCAP 219* 207* 111* 217* 131*   Lipid Profile: No results for input(s): CHOL, HDL, LDLCALC, TRIG, CHOLHDL, LDLDIRECT in the last 72 hours. Thyroid  Function Tests: No results for input(s): TSH, T4TOTAL, FREET4, T3FREE, THYROIDAB in the last 72 hours. Anemia Panel: No results for input(s): VITAMINB12, FOLATE, FERRITIN, TIBC, IRON, RETICCTPCT in the last  72 hours. Sepsis Labs: No results for input(s): PROCALCITON, LATICACIDVEN in the last 168 hours.  Recent Results (from the past 240 hours)  Urine Culture (for pregnant, neutropenic or urologic patients or patients with an indwelling urinary catheter)     Status: Abnormal   Collection Time: 10/12/24  2:02 PM   Specimen: Urine, Catheterized  Result Value Ref Range Status   Specimen Description   Final    URINE, CATHETERIZED Performed at [facility], [address] [place], [place] [address]    Special Requests   Final    NONE Performed at [facility], [address] [place], [place] [address]    Culture 60,000 COLONIES/mL ESCHERICHIA COLI (A)  Final   Report Status 10/15/2024 FINAL  Final   Organism ID, Bacteria ESCHERICHIA COLI (A)  Final      Susceptibility   Escherichia coli - MIC*    AMPICILLIN  >=32 RESISTANT Resistant     CEFAZOLIN  (URINE) Value in next row Sensitive      4 SENSITIVEThis is a modified FDA-approved test that has been validated and its performance characteristics determined by the reporting laboratory.  This laboratory is certified under the Clinical Laboratory Improvement Amendments CLIA as qualified to perform high complexity clinical laboratory testing.    CEFEPIME Value in next row Sensitive      4 SENSITIVEThis is a modified FDA-approved test that has been validated and its performance characteristics determined by the reporting laboratory.  This laboratory is certified under the Clinical Laboratory Improvement Amendments CLIA as qualified to perform high complexity clinical laboratory testing.    ERTAPENEM Value in next row Sensitive      4 SENSITIVEThis is a modified FDA-approved test that has been validated and its performance characteristics determined by the reporting laboratory.  This laboratory is certified under the Clinical Laboratory Improvement Amendments CLIA as qualified to perform high complexity clinical laboratory testing.    CEFTRIAXONE  Value  in next row Sensitive      4 SENSITIVEThis is a modified FDA-approved test that has been validated and its performance characteristics determined by the reporting laboratory.  This laboratory is certified under the Clinical Laboratory Improvement Amendments CLIA as qualified to perform high complexity clinical laboratory testing.    CIPROFLOXACIN Value in next row Sensitive      4 SENSITIVEThis is a modified FDA-approved test that has  been validated and its performance characteristics determined by the reporting laboratory.  This laboratory is certified under the Clinical Laboratory Improvement Amendments CLIA as qualified to perform high complexity clinical laboratory testing.    GENTAMICIN Value in next row Sensitive      4 SENSITIVEThis is a modified FDA-approved test that has been validated and its performance characteristics determined by the reporting laboratory.  This laboratory is certified under the Clinical Laboratory Improvement Amendments CLIA as qualified to perform high complexity clinical laboratory testing.    NITROFURANTOIN  Value in next row Sensitive      4 SENSITIVEThis is a modified FDA-approved test that has been validated and its performance characteristics determined by the reporting laboratory.  This laboratory is certified under the Clinical Laboratory Improvement Amendments CLIA as qualified to perform high complexity clinical laboratory testing.    TRIMETH/SULFA Value in next row Sensitive      4 SENSITIVEThis is a modified FDA-approved test that has been validated and its performance characteristics determined by the reporting laboratory.  This laboratory is certified under the Clinical Laboratory Improvement Amendments CLIA as qualified to perform high complexity clinical laboratory testing.    AMPICILLIN /SULBACTAM Value in next row Resistant      4 SENSITIVEThis is a modified FDA-approved test that has been validated and its performance characteristics determined by the  reporting laboratory.  This laboratory is certified under the Clinical Laboratory Improvement Amendments CLIA as qualified to perform high complexity clinical laboratory testing.    PIP/TAZO Value in next row Sensitive      <=4 SENSITIVEThis is a modified FDA-approved test that has been validated and its performance characteristics determined by the reporting laboratory.  This laboratory is certified under the Clinical Laboratory Improvement Amendments CLIA as qualified to perform high complexity clinical laboratory testing.    MEROPENEM Value in next row Sensitive      <=4 SENSITIVEThis is a modified FDA-approved test that has been validated and its performance characteristics determined by the reporting laboratory.  This laboratory is certified under the Clinical Laboratory Improvement Amendments CLIA as qualified to perform high complexity clinical laboratory testing.    * 60,000 COLONIES/mL ESCHERICHIA COLI         Radiology Studies: No results found.          LOS: 12 days   Time spent= 35 mins    Deliliah Room, MD [facility]  If 7PM-7AM, please contact night-coverage  10/20/2024, 9:32 AM  "

## 2024-10-21 ENCOUNTER — Inpatient Hospital Stay (HOSPITAL_COMMUNITY)

## 2024-10-21 DIAGNOSIS — I5033 Acute on chronic diastolic (congestive) heart failure: Secondary | ICD-10-CM | POA: Diagnosis not present

## 2024-10-21 DIAGNOSIS — I350 Nonrheumatic aortic (valve) stenosis: Secondary | ICD-10-CM

## 2024-10-21 DIAGNOSIS — Z952 Presence of prosthetic heart valve: Secondary | ICD-10-CM

## 2024-10-21 LAB — BASIC METABOLIC PANEL WITH GFR
Anion gap: 11 (ref 5–15)
BUN: 18 mg/dL (ref 8–23)
CO2: 22 mmol/L (ref 22–32)
Calcium: 9.2 mg/dL (ref 8.9–10.3)
Chloride: 98 mmol/L (ref 98–111)
Creatinine, Ser: 1.25 mg/dL — ABNORMAL HIGH (ref 0.44–1.00)
GFR, Estimated: 43 mL/min — ABNORMAL LOW
Glucose, Bld: 182 mg/dL — ABNORMAL HIGH (ref 70–99)
Potassium: 5.9 mmol/L — ABNORMAL HIGH (ref 3.5–5.1)
Sodium: 131 mmol/L — ABNORMAL LOW (ref 135–145)

## 2024-10-21 LAB — GLUCOSE, CAPILLARY
Glucose-Capillary: 131 mg/dL — ABNORMAL HIGH (ref 70–99)
Glucose-Capillary: 138 mg/dL — ABNORMAL HIGH (ref 70–99)
Glucose-Capillary: 178 mg/dL — ABNORMAL HIGH (ref 70–99)
Glucose-Capillary: 125 mg/dL — ABNORMAL HIGH (ref 70–99)
Glucose-Capillary: 191 mg/dL — ABNORMAL HIGH (ref 70–99)

## 2024-10-21 LAB — URINALYSIS, ROUTINE W REFLEX MICROSCOPIC
Bilirubin Urine: NEGATIVE
Glucose, UA: NEGATIVE mg/dL
Hgb urine dipstick: NEGATIVE
Ketones, ur: NEGATIVE mg/dL
Leukocytes,Ua: NEGATIVE
Nitrite: NEGATIVE
Protein, ur: NEGATIVE mg/dL
Specific Gravity, Urine: 1.006 (ref 1.005–1.030)
pH: 8 (ref 5.0–8.0)

## 2024-10-21 LAB — CBC
HCT: 36.3 % (ref 36.0–46.0)
Hemoglobin: 11.1 g/dL — ABNORMAL LOW (ref 12.0–15.0)
MCH: 26.4 pg (ref 26.0–34.0)
MCHC: 30.6 g/dL (ref 30.0–36.0)
MCV: 86.4 fL (ref 80.0–100.0)
Platelets: 88 K/uL — ABNORMAL LOW (ref 150–400)
RBC: 4.2 MIL/uL (ref 3.87–5.11)
RDW: 17.2 % — ABNORMAL HIGH (ref 11.5–15.5)
WBC: 9.4 K/uL (ref 4.0–10.5)
nRBC: 0 % (ref 0.0–0.2)

## 2024-10-21 MED ORDER — IOHEXOL 350 MG/ML SOLN
95.0000 mL | Freq: Once | INTRAVENOUS | Status: AC | PRN
  Administered 2024-10-21: 95 mL via INTRAVENOUS

## 2024-10-21 MED ORDER — SODIUM ZIRCONIUM CYCLOSILICATE 10 G PO PACK
10.0000 g | PACK | Freq: Once | ORAL | Status: AC
  Administered 2024-10-21: 10 g via ORAL
  Filled 2024-10-21: qty 1

## 2024-10-21 MED ORDER — CHLORHEXIDINE GLUCONATE CLOTH 2 % EX PADS
6.0000 | MEDICATED_PAD | Freq: Every day | CUTANEOUS | Status: DC
  Administered 2024-10-21 – 2024-10-26 (×5): 6 via TOPICAL

## 2024-10-21 MED ORDER — GERHARDT'S BUTT CREAM
1.0000 | TOPICAL_CREAM | Freq: Three times a day (TID) | CUTANEOUS | Status: DC
  Administered 2024-10-21 – 2024-10-26 (×10): 1 via TOPICAL
  Filled 2024-10-21 (×2): qty 60

## 2024-10-21 MED ORDER — HYDROCORTISONE 1 % EX CREA
1.0000 | TOPICAL_CREAM | CUTANEOUS | Status: DC | PRN
  Administered 2024-10-21: 1 via TOPICAL
  Filled 2024-10-21 (×2): qty 28

## 2024-10-21 MED ORDER — SODIUM CHLORIDE 0.9% FLUSH: 10.0000 mL | INTRAVENOUS | Status: DC | PRN

## 2024-10-21 MED ORDER — SODIUM CHLORIDE 0.9% FLUSH
10.0000 mL | Freq: Two times a day (BID) | INTRAVENOUS | Status: DC
  Administered 2024-10-21 – 2024-10-22 (×2): 20 mL
  Administered 2024-10-22 – 2024-10-26 (×5): 10 mL

## 2024-10-21 NOTE — Progress Notes (Signed)
 Physical Therapy Treatment Patient Details Name: NEVEEN DAPONTE MRN: [id number] DOB: Feb 09, 1946 Today's Date: 10/21/2024   History of Present Illness JALON BLACKWELDER is a 79 y.o. female transferred to this hospital on -10/19/24 for CT cardiac TAVR protocol. PMH significant for paroxysmal A-fib on Eliquis , chronic HFpEF, prior CVA, carotid artery stenosis, valvular heart disease, CKD 3B, hypertension, hyperlipidemia, type 2 diabetes, hypothyroidism, COPD.    PT Comments  Pt resting in bed on arrival, agreeable to session, however pt with notable fatigue limiting mobility progression this session. Pt requiring min-mod A to complete bed mobility and min A to come to standing. Pt tremulous in standing after ~15 seconds and needing to return to sitting. Pt able to maintain sitting up EOB however pt with with forward flexed posture and poor ability to elevate trunk and head due to fatigue. Pt returning to supine at end of session. Continued education on importance of frequent mobilization within tolerance with pt and family verbalizing understanding. Pt continues to benefit from skilled PT services to progress toward functional mobility goals.     If plan is discharge home, recommend the following: A little help with walking and/or transfers;A little help with bathing/dressing/bathroom;Help with stairs or ramp for entrance;Assist for transportation;Assistance with cooking/housework;Direct supervision/assist for medications management   Can travel by private vehicle     Yes  Equipment Recommendations  None recommended by PT    Recommendations for Other Services       Precautions / Restrictions Precautions Precautions: Fall Recall of Precautions/Restrictions: Intact Restrictions Weight Bearing Restrictions Per Provider Order: No     Mobility  Bed Mobility Overal bed mobility: Needs Assistance Bed Mobility: Supine to Sit, Sit to Supine     Supine to sit: Min assist Sit to supine: Mod  assist   General bed mobility comments: min A to elevate trunk to sitting and to scoot out to EOB, mod A to return LEs to bed    Transfers Overall transfer level: Needs assistance Equipment used: Rolling walker (2 wheels) Transfers: Sit to/from Stand Sit to Stand: Min assist           General transfer comment: min A to steady on rise to stand, pt able to take a few side steps to [facility], too fatigued to ambualte away from EOB    Ambulation/Gait               General Gait Details: pt too fatigued to attempt   Stairs             Wheelchair Mobility     Tilt Bed    Modified Rankin (Stroke Patients Only)       Balance Overall balance assessment: Needs assistance Sitting-balance support: Single extremity supported, Feet supported Sitting balance-Leahy Scale: Poor   Postural control: Other (comment) (forward lean) Standing balance support: Single extremity supported, During functional activity, Reliant on assistive device for balance Standing balance-Leahy Scale: Poor Standing balance comment: Requires UE support for stability, poor activity tolerance                            Communication Communication Communication: No apparent difficulties  Cognition Arousal: Alert Behavior During Therapy: WFL for tasks assessed/performed                             Following commands: Intact      Cueing Cueing Techniques: Verbal cues, Tactile cues  Exercises      General Comments General comments (skin integrity, edema, etc.): VSS on 1L O2, pt datughter and spouse present at EOB      Pertinent Vitals/Pain Pain Assessment Pain Assessment: No/denies pain    Home Living Family/patient expects to be discharged to:: Private residence Living Arrangements: Spouse/significant other Available Help at Discharge: Family;Available 24 hours/day Type of Home: House Home Access: Stairs to enter Entrance Stairs-Rails: Right;Left Entrance  Stairs-Number of Steps: 3 Alternate Level Stairs-Number of Steps: Flight Home Layout: Two level;Able to live on main level with bedroom/bathroom Home Equipment: Shower seat - built in;Grab bars - tub/shower;Rolling Walker (2 wheels);Rollator (4 wheels);Wheelchair - manual;BSC/3in1 Additional Comments: Pt lives with husband who is available 24/7, has neighbor who comes by every day, daughter and her husband come by every day.    Prior Function            PT Goals (current goals can now be found in the care plan section) Acute Rehab PT Goals PT Goal Formulation: With patient/family Time For Goal Achievement: 10/23/24 Progress towards PT goals: Not progressing toward goals - comment (increased fatigue)    Frequency    Min 2X/week      PT Plan      Co-evaluation              AM-PAC PT 6 Clicks Mobility   Outcome Measure  Help needed turning from your back to your side while in a flat bed without using bedrails?: A Little Help needed moving from lying on your back to sitting on the side of a flat bed without using bedrails?: A Little Help needed moving to and from a bed to a chair (including a wheelchair)?: A Little Help needed standing up from a chair using your arms (e.g., wheelchair or bedside chair)?: A Little Help needed to walk in hospital room?: Total Help needed climbing 3-5 steps with a railing? : Total 6 Click Score: 14    End of Session Equipment Utilized During Treatment: Oxygen  Activity Tolerance: Patient limited by fatigue Patient left: in bed;with call bell/phone within reach;with family/visitor present;with bed alarm set Nurse Communication: Mobility status PT Visit Diagnosis: Unsteadiness on feet (R26.81);Other abnormalities of gait and mobility (R26.89);Muscle weakness (generalized) (M62.81)     Time: 9070-9047 PT Time Calculation (min) (ACUTE ONLY): 23 min  Charges:    $Therapeutic Activity: 23-37 mins PT General Charges $$ ACUTE PT VISIT:  1 Visit                     Taegan Standage R. PTA Acute Rehabilitation Services Office: [phone]   Therisa CHRISTELLA Boor 10/21/2024, 2:46 PM

## 2024-10-21 NOTE — Progress Notes (Signed)
 " PROGRESS NOTE    [name]  FMW:[id number] DOB: [birth year] DOA: 10/08/2024 PCP: Sheryle Carwin, MD   Brief Narrative:  79 y.o. female with medical history significant for paroxysmal A-fib on Eliquis , chronic HFpEF, prior CVA, carotid artery stenosis, valvular heart disease, CKD 3B, hypertension, hyperlipidemia, type 2 diabetes, hypothyroidism, COPD, bioprosthetic aortic and mitral valve 07/18/2013 presenting with shortness of breath that began on the evening of 10/09/2023.  She has noted some lower extremity edema.  She was admitted with acute hypoxemic respiratory failure in the setting of acute on chronic HFpEF with new severe RV dysfunction.  She is currently being diuresed with IV Lasix  and cardiology is now following with recommendations for VQ scan and possible need for heart catheterization during this admission.  She is also noted to have E. coli UTI and was started on cefadroxil  with sensitivity noted.   Cardiology following and has repeated echocardiogram on 1/15 with moderate to severe prosthetic MS and AS.  Transferred to [name] for CT cardiac TAVR protocol, to be done on 1/19.   Assessment & Plan:  Principal Problem:   Acute on chronic diastolic (congestive) heart failure (HCC) Active Problems:   Upper airway cough syndrome in pt with mild cough variant asthma    PAF (paroxysmal atrial fibrillation) (HCC)   Long term current use of amiodarone    Long term (current) use of anticoagulants   Acute combined systolic and diastolic heart failure (HCC)   Severe aortic stenosis   S/P AVR (aortic valve replacement)   Right ventricular failure (HCC)   Acute hypoxic respiratory failure (HCC)   S/P MVR (mitral valve repair)   S/P AVR    Acute respiratory failure with hypoxia,POA: - Multifactorial including COPD exacerbation and CHF - Stable on 2 L nasal cannula>>96% - 1/8 CT chest--new trace L-pleural eff and small loculated R-effusion; bilateral GGO - 1/13 CXR--slight increase  R-pleural effusion.  Chronic interstitial prominence -Continue diuresis with IV Lasix  40 mg twice daily per cardiology recommendations   Acute systolic and diastolic heart failure with biventricular heart failure in setting of moderate to severe prosthetic MS and AS, NYHA IV - LVEF 45% with severe RV systolic dysfunction noted -Continue IV Lasix  20 mg twice daily with close monitoring of renal function and electrolytes. -VQ scan negative for PE - Repeat 2D echocardiogram showing prosthetic valve degeneration on 1/15 -Transferred to [facility] on 1/16 for CT cardiac TAVR protocol per cardiology -Spoke to Dr Stacia on 1/17 and plan is to get CT TAVR protocol on Monday, 1/19.   E coli UTI - Completed course of cefadroxil    CKD stage IIIb - Baseline creatinine 1.1-1.4 - Monitor with diuresis   Paroxysmal atrial fibrillation - Continue apixaban  - continue amiodarone    Elevated troponin - Troponin trend is flat, not consistent with ACS - Troponin 42>>41   Mixed hyperlipidemia - Continue statin   Diabetes mellitus type 2 - NovoLog  sliding scale - 09/12/2024 hemoglobin A1c 6.3 -  elevated CBGs secondary to steroids - novolog  sliding scale   Elevated D-dimer - VQ scan negative for PE  Disposition: She was living at home with her husband. She will likely need SNF placement on discharge.  DVT prophylaxis:  apixaban  (ELIQUIS ) tablet 5 mg     Code Status: Limited: Do not attempt resuscitation (DNR) -DNR-LIMITED -Do Not Intubate/DNI  Family Communication:  Daughter is at the bedside Status is: Inpatient Remains inpatient appropriate because: Acute CHF, Hypoxia    Subjective:  Complaining of weakness only, shortness  of breath has improved as compared to yesterday. Awaiting CT TAVR protocol to be done today.  Examination:  General exam: Appears calm and comfortable, nasal oxygen  cannula in place  Respiratory system: Clear to auscultation. Respiratory effort  normal. Cardiovascular system: S1 & S2 heard, RRR. No JVD, murmurs, rubs, gallops or clicks. 2+ bilateral pedal edema. Gastrointestinal system: Abdomen is nondistended, soft and nontender. No organomegaly or masses felt. Normal bowel sounds heard. Central nervous system: Alert and oriented. No focal neurological deficits. Extremities: Symmetric 5 x 5 power. Skin: No rashes, lesions or ulcers      Diet Orders (From admission, onward)     Start     Ordered   10/08/24 2317  Diet heart healthy/carb modified Room service appropriate? Yes; Fluid consistency: Thin  Diet effective now       Question Answer Comment  Diet-HS Snack? Nothing   Room service appropriate? Yes   Fluid consistency: Thin      10/08/24 2316            Objective: Vitals:   10/20/24 2016 10/21/24 0455 10/21/24 0500 10/21/24 0810  BP:  121/66  (!) 137/56  Pulse:  79  62  Resp:  17  18  Temp:  97.7 F (36.5 C)  97.8 F (36.6 C)  TempSrc:  Oral  Oral  SpO2: 96% 97%  99%  Weight:   68.5 kg   Height:        Intake/Output Summary (Last 24 hours) at 10/21/2024 0859 Last data filed at 10/21/2024 0455 Gross per 24 hour  Intake 720 ml  Output 250 ml  Net 470 ml   Filed Weights   10/19/24 0433 10/20/24 0500 10/21/24 0500  Weight: 67.6 kg 66 kg 68.5 kg    Scheduled Meds:  amiodarone   200 mg Oral Daily   apixaban   5 mg Oral BID   atorvastatin   40 mg Oral Daily   budesonide -glycopyrrolate -formoterol   2 puff Inhalation BID   feeding supplement  237 mL Oral BID BM   furosemide   20 mg Intravenous BID   HYDROcodone  bit-homatropine  5 mL Oral QHS   insulin  aspart  0-5 Units Subcutaneous QHS   insulin  aspart  0-9 Units Subcutaneous TID WC   levothyroxine   100 mcg Oral QAC breakfast   lidocaine  HCl (PF)  10 mL Intradermal Once   pantoprazole   40 mg Oral QAC breakfast   potassium chloride   40 mEq Oral BID   Continuous Infusions:  Nutritional status     Body mass index is 27.62 kg/m.  Data Reviewed:    CBC: Recent Labs  Lab 10/17/24 0406 10/18/24 0527 10/19/24 0439 10/20/24 0344 10/21/24 0430  WBC 8.1 7.8 8.3 8.1 9.4  HGB 10.2* 10.0* 10.8* 11.3* 11.1*  HCT 34.2* 33.3* 35.0* 36.4 36.3  MCV 87.5 86.0 85.4 85.6 86.4  PLT 111* 104* 93* 87* 88*   Basic Metabolic Panel: Recent Labs  Lab 10/15/24 0529 10/16/24 0517 10/17/24 0406 10/18/24 0527 10/19/24 0439 10/20/24 0344  NA 137 136 136 135 131* 137  K 3.0* 3.7 3.0* 3.6 5.2* 4.7  CL 95* 94* 94* 94* 94* 99  CO2 36* 29 28 30 27 28   GLUCOSE 93 148* 106* 92 119* 110*  BUN 21 18 14 14 19 19   CREATININE 0.95 1.15* 1.04* 1.04* 1.15* 1.20*  CALCIUM  8.1* 8.5* 8.3* 8.3* 8.7* 9.0  MG 1.8 1.7 1.7 2.1 1.7  --    GFR: Estimated Creatinine Clearance: 33.9 mL/min (A) (by C-G formula based  on SCr of 1.2 mg/dL (H)). Liver Function Tests: Recent Labs  Lab 10/16/24 0517  AST 35  ALT 43  ALKPHOS 73  BILITOT 1.0  PROT 6.6  ALBUMIN  3.5   No results for input(s): LIPASE, AMYLASE in the last 168 hours. No results for input(s): AMMONIA in the last 168 hours. Coagulation Profile: No results for input(s): INR, PROTIME in the last 168 hours. Cardiac Enzymes: No results for input(s): CKTOTAL, CKMB, CKMBINDEX, TROPONINI in the last 168 hours. BNP (last 3 results) Recent Labs    07/24/24 0438 09/11/24 1824 10/08/24 2147  PROBNP 2,155.0* 5,666.0* 5,574.0*   HbA1C: No results for input(s): HGBA1C in the last 72 hours. CBG: Recent Labs  Lab 10/20/24 1128 10/20/24 1617 10/20/24 2113 10/21/24 0811 10/21/24 0817  GLUCAP 230* 121* 216* 131* 138*   Lipid Profile: No results for input(s): CHOL, HDL, LDLCALC, TRIG, CHOLHDL, LDLDIRECT in the last 72 hours. Thyroid  Function Tests: No results for input(s): TSH, T4TOTAL, FREET4, T3FREE, THYROIDAB in the last 72 hours. Anemia Panel: No results for input(s): VITAMINB12, FOLATE, FERRITIN, TIBC, IRON, RETICCTPCT in the last 72  hours. Sepsis Labs: No results for input(s): PROCALCITON, LATICACIDVEN in the last 168 hours.  Recent Results (from the past 240 hours)  Urine Culture (for pregnant, neutropenic or urologic patients or patients with an indwelling urinary catheter)     Status: Abnormal   Collection Time: 10/12/24  2:02 PM   Specimen: Urine, Catheterized  Result Value Ref Range Status   Specimen Description   Final    URINE, CATHETERIZED Performed at [facility], [address], [place], [place] [address]    Special Requests   Final    NONE Performed at [facility], [address], [place], [place] [address]    Culture 60,000 COLONIES/mL ESCHERICHIA COLI (A)  Final   Report Status 10/15/2024 FINAL  Final   Organism ID, Bacteria ESCHERICHIA COLI (A)  Final      Susceptibility   Escherichia coli - MIC*    AMPICILLIN  >=32 RESISTANT Resistant     CEFAZOLIN  (URINE) Value in next row Sensitive      4 SENSITIVEThis is a modified FDA-approved test that has been validated and its performance characteristics determined by the reporting laboratory.  This laboratory is certified under the Clinical Laboratory Improvement Amendments CLIA as qualified to perform high complexity clinical laboratory testing.    CEFEPIME Value in next row Sensitive      4 SENSITIVEThis is a modified FDA-approved test that has been validated and its performance characteristics determined by the reporting laboratory.  This laboratory is certified under the Clinical Laboratory Improvement Amendments CLIA as qualified to perform high complexity clinical laboratory testing.    ERTAPENEM Value in next row Sensitive      4 SENSITIVEThis is a modified FDA-approved test that has been validated and its performance characteristics determined by the reporting laboratory.  This laboratory is certified under the Clinical Laboratory Improvement Amendments CLIA as qualified to perform high complexity clinical laboratory testing.    CEFTRIAXONE  Value in  next row Sensitive      4 SENSITIVEThis is a modified FDA-approved test that has been validated and its performance characteristics determined by the reporting laboratory.  This laboratory is certified under the Clinical Laboratory Improvement Amendments CLIA as qualified to perform high complexity clinical laboratory testing.    CIPROFLOXACIN Value in next row Sensitive      4 SENSITIVEThis is a modified FDA-approved test that has been validated and its performance  characteristics determined by the reporting laboratory.  This laboratory is certified under the Clinical Laboratory Improvement Amendments CLIA as qualified to perform high complexity clinical laboratory testing.    GENTAMICIN Value in next row Sensitive      4 SENSITIVEThis is a modified FDA-approved test that has been validated and its performance characteristics determined by the reporting laboratory.  This laboratory is certified under the Clinical Laboratory Improvement Amendments CLIA as qualified to perform high complexity clinical laboratory testing.    NITROFURANTOIN  Value in next row Sensitive      4 SENSITIVEThis is a modified FDA-approved test that has been validated and its performance characteristics determined by the reporting laboratory.  This laboratory is certified under the Clinical Laboratory Improvement Amendments CLIA as qualified to perform high complexity clinical laboratory testing.    TRIMETH/SULFA Value in next row Sensitive      4 SENSITIVEThis is a modified FDA-approved test that has been validated and its performance characteristics determined by the reporting laboratory.  This laboratory is certified under the Clinical Laboratory Improvement Amendments CLIA as qualified to perform high complexity clinical laboratory testing.    AMPICILLIN /SULBACTAM Value in next row Resistant      4 SENSITIVEThis is a modified FDA-approved test that has been validated and its performance characteristics determined by the  reporting laboratory.  This laboratory is certified under the Clinical Laboratory Improvement Amendments CLIA as qualified to perform high complexity clinical laboratory testing.    PIP/TAZO Value in next row Sensitive      <=4 SENSITIVEThis is a modified FDA-approved test that has been validated and its performance characteristics determined by the reporting laboratory.  This laboratory is certified under the Clinical Laboratory Improvement Amendments CLIA as qualified to perform high complexity clinical laboratory testing.    MEROPENEM Value in next row Sensitive      <=4 SENSITIVEThis is a modified FDA-approved test that has been validated and its performance characteristics determined by the reporting laboratory.  This laboratory is certified under the Clinical Laboratory Improvement Amendments CLIA as qualified to perform high complexity clinical laboratory testing.    * 60,000 COLONIES/mL ESCHERICHIA COLI         Radiology Studies: No results found.          LOS: 13 days   Time spent= 35 mins    Deliliah Room, MD [facility]  If 7PM-7AM, please contact night-coverage  10/21/2024, 8:59 AM  "

## 2024-10-21 NOTE — Progress Notes (Signed)

## 2024-10-21 NOTE — Progress Notes (Addendum)
 "  Rounding Note   Patient Name: [name] Date of Encounter: 10/21/2024  [facility] Cardiologist: Alvan Carrier, MD   Subjective No acute overnight events. Her main complaint today is severe weakness. She feels like her breathing is back to normal. She is on 2L of O2 via nasal cannula. No chest pain.  Scheduled Meds:  amiodarone   200 mg Oral Daily   apixaban   5 mg Oral BID   atorvastatin   40 mg Oral Daily   budesonide -glycopyrrolate -formoterol   2 puff Inhalation BID   feeding supplement  237 mL Oral BID BM   furosemide   20 mg Intravenous BID   HYDROcodone  bit-homatropine  5 mL Oral QHS   insulin  aspart  0-5 Units Subcutaneous QHS   insulin  aspart  0-9 Units Subcutaneous TID WC   levothyroxine   100 mcg Oral QAC breakfast   lidocaine  HCl (PF)  10 mL Intradermal Once   pantoprazole   40 mg Oral QAC breakfast   potassium chloride   40 mEq Oral BID   Continuous Infusions:  PRN Meds: acetaminophen , azelastine , guaiFENesin -dextromethorphan , ipratropium-albuterol , meclizine , melatonin, menthol , polyethylene glycol, prochlorperazine    Vital Signs  Vitals:   10/20/24 2016 10/21/24 0455 10/21/24 0500 10/21/24 0810  BP:  121/66  (!) 137/56  Pulse:  79  62  Resp:  17  18  Temp:  97.7 F (36.5 C)  97.8 F (36.6 C)  TempSrc:  Oral  Oral  SpO2: 96% 97%  99%  Weight:   68.5 kg   Height:        Intake/Output Summary (Last 24 hours) at 10/21/2024 1030 Last data filed at 10/21/2024 1000 Gross per 24 hour  Intake 600 ml  Output 250 ml  Net 350 ml      10/21/2024    5:00 AM 10/20/2024    5:00 AM 10/19/2024    4:33 AM  Last 3 Weights  Weight (lbs) 151 lb 0.2 oz 145 lb 8.1 oz 149 lb 0.5 oz  Weight (kg) 68.5 kg 66 kg 67.6 kg      Telemetry Normal sinus rhythm with prolonged PR.Questionable Wenckebach at times. Rates in the 60s to 70s. - Personally Reviewed  ECG  No new ECG tracing today. - Personally Reviewed  Physical Exam  GEN: No acute distress.   Neck:  Elevated JVD. Cardiac: RRR. No significant murmurs appreciated.  Respiratory: No increased work of breathing. Clear to auscultation bilaterally. No wheezes, rhonchi, or rales. MS: No lower extremity edema. No deformity. Neuro:  No focal deficits. Psych: Normal affect. Responds appropriately.  Labs High Sensitivity Troponin:  No results for input(s): TROPONINIHS in the last 720 hours.  Recent Labs  Lab 10/08/24 2147 10/09/24 0021  TRNPT 42* 41*       Chemistry Recent Labs  Lab 10/16/24 0517 10/17/24 0406 10/18/24 0527 10/19/24 0439 10/20/24 0344  NA 136 136 135 131* 137  K 3.7 3.0* 3.6 5.2* 4.7  CL 94* 94* 94* 94* 99  CO2 29 28 30 27 28   GLUCOSE 148* 106* 92 119* 110*  BUN 18 14 14 19 19   CREATININE 1.15* 1.04* 1.04* 1.15* 1.20*  CALCIUM  8.5* 8.3* 8.3* 8.7* 9.0  MG 1.7 1.7 2.1 1.7  --   PROT 6.6  --   --   --   --   ALBUMIN  3.5  --   --   --   --   AST 35  --   --   --   --   ALT 43  --   --   --   --  ALKPHOS 73  --   --   --   --   BILITOT 1.0  --   --   --   --   GFRNONAA 48* 54* 54* 48* 46*  ANIONGAP 12 14 11 10 10     Lipids No results for input(s): CHOL, TRIG, HDL, LABVLDL, LDLCALC, CHOLHDL in the last 168 hours.  Hematology Recent Labs  Lab 10/19/24 0439 10/20/24 0344 10/21/24 0430  WBC 8.3 8.1 9.4  RBC 4.10 4.25 4.20  HGB 10.8* 11.3* 11.1*  HCT 35.0* 36.4 36.3  MCV 85.4 85.6 86.4  MCH 26.3 26.6 26.4  MCHC 30.9 31.0 30.6  RDW 16.8* 17.2* 17.2*  PLT 93* 87* 88*   Thyroid  No results for input(s): TSH, FREET4 in the last 168 hours.  BNPNo results for input(s): BNP, PROBNP in the last 168 hours.  DDimer No results for input(s): DDIMER in the last 168 hours.   Radiology  No results found.  Cardiac Studies  Limited Echocardiogram 10/15/2024: Impressions: 1. Left ventricular ejection fraction, by estimation, is 45%. The left  ventricle has mildly decreased function. Left ventricular endocardial  border not optimally defined  to evaluate regional wall motion. Left  ventricular diastolic function could not be  evaluated. There is the interventricular septum is flattened in systole  and diastole, consistent with right ventricular pressure and volume  overload.   2. Right ventricular systolic function is severely reduced. The right  ventricular size is moderately enlarged. There is moderately elevated  pulmonary artery systolic pressure. The estimated right ventricular  systolic pressure is 54.8 mmHg.   3. The inferior vena cava is normal in size with <50% respiratory  variability, suggesting right atrial pressure of 8 mmHg.  _______________  Limited Echocardiogram 10/17/2024: Impressions: 1. Limited study.   2. Left ventricular ejection fraction, by estimation, is 60 to 65%. The  left ventricle has normal function. The left ventricle has no regional  wall motion abnormalities. There is the interventricular septum is  flattened in systole and diastole,  consistent with right ventricular pressure and volume overload.   3. RVSP not estimated. Right ventricular systolic function is severely  reduced. The right ventricular size is moderately enlarged.   4. Papillary muscle calcification noted. Trivial mitral valve  regurgitation. Moderate to severe mitral stenosis. The mean mitral valve  gradient is 10.0 mmHg. There is a 25 mm Magna Ease bioprosthetic valve  present in the mitral position.   5. There is mild calcification of the prosthetic aortic valve. Aortic  valve regurgitation is at least mild, not clear that PHT is accurate and  jet somewhat obscured by transmitral flow jet. Moderate to severe aortic  valve stenosis. There is a 21 mm  Magna Ease bioprosthetic valve present in the aortic position. Aortic  valve mean gradient measures 21.3 mmHg. Dimentionless index 0.25.   Comparison(s): If clinically reasonable, consider TEE for further  evaluation of prosthetic valvular function. Both mitral and aortic  mean  gradients have increased compared with prior complete study.    Patient Profile   79 y.o. female with a history of chronic HFpEF, rheumatic heart disease s/p bioproshetic AVR and MVR in 2014, paroxysmal atrial fibrillation/flutter on Eliquis , CVA hypertension, hyperlipidemia, type 2 diabetes mellitus, CKD stage III, and breast cancer who was admitted at [facility] on 10/09/2023 for acute hypoxic respiratory failure secondary to acute on chronic CHF after presenting with sudden on set of shortness of breath. She was diuresed with IV Lasix . Echo showed normal LV  function and severe RV dysfunction as well new moderate to severe stenosis of both MVR and AVR which was new compared to 07/2024. Therefore, she was transferred to [facility] for CT cardiac TAVR protocol.  Assessment & Plan   Acute on Chronic HFpEF Severe RV Failure Patient was admitted with sudden onset of shortness of breath.  Pro BNP elevated at 5,574. Chest x-ray showed mild central pulmonary vascular congestion with interstitial edema. Limited Echo on 10/15/2024 showed LVEF of 45% and severe RV dysfunction with flattened interventricular septum in systole and diastole consistent with RV pressure and volume overload. Repeat limited Echo on 10/17/2024 showed LVEF of 60-65% with normal wall motion, severe RV dysfunction with interventricular septum in systole and diastole consistent with RV pressure and volume overload, and new moderate to severe stenosis of both MVR and AVR compared to last Echo in 07/2024. She was started on IV Lasix . Net negative 5.1 L this admission. Weight have fluctuated this admissio (unsure if they are accurate). - She has elevated JVD on exam but otherwise does not appear significantly volume overloaded. - Will get a complete Echo since she has only had limited ones this admission. - Continue IV Lasix  20mg  twice daily. - Could consider adding Spironolactone prior to discharge.  - Will hold off on SGLT2  inhibitor givens he is prone to UTIs. - Continue to monitor daily weights, strict I/Os, and renal function.  Rheumatic Heart Disease S/p AVR and MVR  Patient has a history of rheumatic heart disease and underwent bioprosthetic AVR and MVR in 2014. Echo this admission showed new moderate to severe stenosis of both AVR (mean gradient 21.3 mmHg) and MVR (mean gradient 10.0 mmHg) compared to last Echo in 07/2024. - Will check BMET today and plan for CT cardiac TAVR today and then likely [facility] tomorrow.  Elevated Troponin High-sensitivity troponin minimally elevated and flat at 42 >> 41. Echo as above.  - No chest pain.  - Troponin elevation is not consistent with ACS. Consistent with demand ischemic secondary to acute on chronic CHF. However, plan is for likely cardiac catheterization this admission due to valvular disease.  Paroxysmal Atrial Fibrillation/ Flutter Maintaining sinus rhythm. She has some prolonging PR that look consistent with Wenckebach at times. - Continue Amiodarone  200mg  daily. - Continue Eliquis  5mg  twice daily.  Hypertension BP mostly well controlled.  - Not currently on any antihypertensives.  Hyperlipidemia - Continue Lipitor 40mg  daily.  Type 2 Diabetes Mellitus Management per primary team.  CKD Stage III Creatinine 1.55 on admission. Baseline creatinine around 1.1 to 1.3. - Creatinine has improved with diuresis and was stable at 1.2 yesterday.  - Will recheck BMET.  Weakness UTI Patient main complaint is severe weakness today which is new compared to yesterday. She is afebrile and WBC is normal. Daughter does state that she is prone to UTIs and she always has weakness with this.  - She was treated for a E.coli UTI earlier this admission  on 1/10.  - Will repeat urinalysis.    For questions or updates, please contact  [facility] Please consult [email or web address] for contact info under       Signed, Callie E Goodrich, PA-C  10/21/2024, 10:30 AM     ATTENDING ATTESTATION:  After conducting a review of all available clinical information with the care team, interviewing the patient, and performing a physical exam, I agree with the findings and plan described in this note with adjustments as indicated below which were discussed and enacted by staff  above.   GEN: No acute distress, somnolent, weak HEENT:  MMM, +JVD, no scleral icterus Cardiac: RRR, soft systolic murmur  Respiratory: Coarse GI: Soft, nontender, non-distended  MS: No edema; No deformity. Neuro:  Nonfocal  Vasc:  +2 radial pulses  Patient's biggest complaint today is weakness.  Apparently when she develops urinary tract infections she presents with weakness.  Will obtain urinalysis to evaluate further.  I had an opportunity to review her limited TTE done recently.  This demonstrates both bioprosthetic degeneration of the previous AVR and MVR.  She has severe RV dysfunction as well.  On my review of her echocardiographic images I am concerned about her left ventricular outflow tract and whether a TMVR procedure would be associated with left ventricular outflow tract obstruction.  Certainly with bioprosthetic mitral stenosis her LV is relatively underfilled and could exacerbate this issue.  We will obtain a urinalysis today.  Will also obtain a TAVR protocol CTA.  I would like to perform coronary angiography and a right heart catheterization prior to discharge.  She is very weak and would need to rehabilitate prior to any potential valve intervention.  For now we will continue diuretics with IV Lasix  20 mg twice daily.  Check BMP today for potential CTA today.  Overall she is weak and quite frail.  We will have to seriously evaluate her candidacy for any valve procedures.  Kailoni Vahle, MD Pager [phone]   "

## 2024-10-21 NOTE — Evaluation (Addendum)
 Occupational Therapy Treatment Patient Details Name: [name] MRN: [id number] DOB: [birth year] Today's Date: 10/21/2024   History of Present Illness   [name] is a 79 y.o. female transferred to this hospital on -10/19/24 for CT cardiac TAVR protocol. PMH significant for paroxysmal A-fib on Eliquis , chronic HFpEF, prior CVA, carotid artery stenosis, valvular heart disease, CKD 3B, hypertension, hyperlipidemia, type 2 diabetes, hypothyroidism, COPD.     Clinical Impressions Pt resting in bed comfortably, husband/daughter present. Pt lives at home with husband who is available to assist as needed, has home aide who helps when husband is out of the house. PLOF Pt family helps with LB ADLs, bathing, set up for other ADLs. Pt with 3-5 falls in the last 6 months. Pt currently with significant generalized weakness and poor activity tolerance. Pt mod A for assist to EOB, min A for STS with RW. When ambulating to the door 6 feet away Pt became very tired/weak, no dizziness, LOB x2 mod A to recover balance and return safely to bed. Pt too tired to get back into bed, VSS on RA, max A assist to bed. Pt would benefit from postacute rehab <3hrs/day to maximize activity tolerance and functional strength to return to mod I level for mobility/ADLs. Will continue to see acutely to progress as able.      If plan is discharge home, recommend the following:   A lot of help with walking and/or transfers;A little help with bathing/dressing/bathroom;Assistance with cooking/housework;Assist for transportation;Help with stairs or ramp for entrance     Functional Status Assessment   Patient has had a recent decline in their functional status and demonstrates the ability to make significant improvements in function in a reasonable and predictable amount of time.     Equipment Recommendations   None recommended by OT     Recommendations for Other Services         Precautions/Restrictions    Precautions Precautions: Fall Recall of Precautions/Restrictions: Intact Restrictions Weight Bearing Restrictions Per Provider Order: No     Mobility Bed Mobility Overal bed mobility: Needs Assistance Bed Mobility: Supine to Sit, Sit to Supine     Supine to sit: Mod assist Sit to supine: Max assist   General bed mobility comments: strong mod A for assist to EOB, Pt too tired to get back into bed after OOB activities, max A return to bed.    Transfers Overall transfer level: Needs assistance Equipment used: Rolling walker (2 wheels) Transfers: Sit to/from Stand, Bed to chair/wheelchair/BSC Sit to Stand: Min assist     Step pivot transfers: Min assist, Mod assist     General transfer comment: RW for support, strong min A for STS, LOB 2X when ambulating short distance, mod A for recovery.      Balance Overall balance assessment: Needs assistance Sitting-balance support: Single extremity supported, Feet supported Sitting balance-Leahy Scale: Poor   Postural control: Other (comment) (forward lean) Standing balance support: Single extremity supported, During functional activity, Reliant on assistive device for balance Standing balance-Leahy Scale: Poor Standing balance comment: Requires UE support for stability, poor activity tolerance                           ADL either performed or assessed with clinical judgement   ADL Overall ADL's : Needs assistance/impaired Eating/Feeding: Set up;Sitting   Grooming: Set up;Sitting   Upper Body Bathing: Minimal assistance;Sitting   Lower Body Bathing: Maximal assistance;Sitting/lateral leans   Upper  Body Dressing : Minimal assistance;Sitting   Lower Body Dressing: Maximal assistance;Sitting/lateral leans;Sit to/from stand   Toilet Transfer: Minimal assistance;Moderate assistance;Rolling walker (2 wheels);BSC/3in1   Toileting- Clothing Manipulation and Hygiene: Minimal assistance;Sitting/lateral lean        Functional mobility during ADLs: Minimal assistance;Moderate assistance;Rolling walker (2 wheels) General ADL Comments: Pt with decreased strength/activity tolerance, RW for transfers very short distances.     Vision Baseline Vision/History: 1 Wears glasses Ability to See in Adequate Light: 0 Adequate Patient Visual Report: No change from baseline       Perception         Praxis         Pertinent Vitals/Pain Pain Assessment Pain Assessment: No/denies pain     Extremity/Trunk Assessment Upper Extremity Assessment Upper Extremity Assessment: Generalized weakness   Lower Extremity Assessment Lower Extremity Assessment: Defer to PT evaluation       Communication Communication Communication: No apparent difficulties   Cognition Arousal: Alert Behavior During Therapy: WFL for tasks assessed/performed Cognition: Cognition impaired   Orientation impairments: Time       Executive functioning impairment (select all impairments): Initiation, Sequencing, Problem solving OT - Cognition Comments: Pt not oriented to year or day, grossly oriented to self, situation, location, and what month it is. Pt with some sequencing and problem solving deficits with ADLs, increased verbal cues to get in/out of bed, using RW, initiation of tasks.                 Following commands: Intact       Cueing  General Comments   Cueing Techniques: Verbal cues;Tactile cues      Exercises     Shoulder Instructions      Home Living Family/patient expects to be discharged to:: Private residence Living Arrangements: Spouse/significant other Available Help at Discharge: Family;Available 24 hours/day Type of Home: House Home Access: Stairs to enter [employer] of Steps: 3 Entrance Stairs-Rails: Right;Left Home Layout: Two level;Able to live on main level with bedroom/bathroom Alternate Level Stairs-Number of Steps: Flight   Bathroom Shower/Tub: Company Secretary: Handicapped height Bathroom Accessibility: Yes How Accessible: Accessible via walker Home Equipment: Shower seat - built in;Grab bars - tub/shower;Rolling Environmental Consultant (2 wheels);Rollator (4 wheels);Wheelchair - manual;BSC/3in1   Additional Comments: Pt lives with husband who is available 24/7, has neighbor who comes by every day, daughter and her husband come by every day.      Prior Functioning/Environment Prior Level of Function : Needs assist;History of Falls (last six months)             Mobility Comments: household ambulation using RW, 3 falls in last 6 months ADLs Comments: assist for bathing, dressing, and PRN assist for toileting, has home aide 4 hours/day x 5 days/week    OT Problem List: Decreased strength;Decreased range of motion;Decreased activity tolerance;Decreased safety awareness   OT Treatment/Interventions: Self-care/ADL training;Therapeutic exercise;Energy conservation;DME and/or AE instruction;Therapeutic activities;Patient/family education;Balance training      OT Goals(Current goals can be found in the care plan section)   Acute Rehab OT Goals Patient Stated Goal: to improve strength OT Goal Formulation: With patient/family Time For Goal Achievement: 11/04/24 Potential to Achieve Goals: Good ADL Goals Pt Will Perform Grooming: with modified independence;standing Pt Will Perform Upper Body Bathing: with set-up;sitting Pt Will Perform Lower Body Dressing: with min assist;sitting/lateral leans Pt Will Transfer to Toilet: with supervision;ambulating   OT Frequency:  Min 2X/week    Co-evaluation  AM-PAC OT 6 Clicks Daily Activity     Outcome Measure Help from another person eating meals?: A Little Help from another person taking care of personal grooming?: A Little Help from another person toileting, which includes using toliet, bedpan, or urinal?: A Lot Help from another person bathing (including washing, rinsing,  drying)?: A Lot Help from another person to put on and taking off regular upper body clothing?: A Little Help from another person to put on and taking off regular lower body clothing?: A Lot 6 Click Score: 15   End of Session Equipment Utilized During Treatment: Gait belt;Rolling walker (2 wheels) Nurse Communication: Mobility status  Activity Tolerance: Patient tolerated treatment well Patient left: in bed;with call bell/phone within reach;with family/visitor present  OT Visit Diagnosis: Unsteadiness on feet (R26.81);Repeated falls (R29.6);Muscle weakness (generalized) (M62.81);Other abnormalities of gait and mobility (R26.89);History of falling (Z91.81)                Time: 8793-8759 OT Time Calculation (min): 34 min Charges:  OT General Charges $OT Visit: 1 Visit OT Treatments $Self Care/Home Management : 23-37 mins  [address] OTR/L   [name] 10/21/2024, 1:04 PM

## 2024-10-21 NOTE — Plan of Care (Signed)
" °  Problem: Education: Goal: Knowledge of General Education information will improve Description: Including pain rating scale, medication(s)/side effects and non-pharmacologic comfort measures Outcome: Progressing   Problem: Health Behavior/Discharge Planning: Goal: Ability to manage health-related needs will improve Outcome: Progressing   Problem: Clinical Measurements: Goal: Cardiovascular complication will be avoided Outcome: Progressing   Problem: Activity: Goal: Risk for activity intolerance will decrease Outcome: Progressing   Problem: Elimination: Goal: Will not experience complications related to bowel motility Outcome: Progressing Goal: Will not experience complications related to urinary retention Outcome: Progressing   "

## 2024-10-21 NOTE — Progress Notes (Signed)
" °  Echocardiogram 2D Echocardiogram has been performed.  [name] 10/21/2024, 6:01 PM "

## 2024-10-22 LAB — ECHOCARDIOGRAM COMPLETE
AR max vel: 0.54 cm2
AV Area VTI: 0.5 cm2
AV Area mean vel: 0.46 cm2
AV Mean grad: 21 mmHg
AV Peak grad: 31.6 mmHg
Ao pk vel: 2.81 m/s
Calc EF: 32.6 %
Height: 62 in
MV VTI: 0.35 cm2
S' Lateral: 2.5 cm
Single Plane A2C EF: 25.6 %
Single Plane A4C EF: 41.3 %
Weight: 2416.24 [oz_av]

## 2024-10-22 LAB — BASIC METABOLIC PANEL WITH GFR
Anion gap: 12 (ref 5–15)
BUN: 15 mg/dL (ref 8–23)
CO2: 25 mmol/L (ref 22–32)
Calcium: 9.1 mg/dL (ref 8.9–10.3)
Chloride: 100 mmol/L (ref 98–111)
Creatinine, Ser: 1.25 mg/dL — ABNORMAL HIGH (ref 0.44–1.00)
GFR, Estimated: 43 mL/min — ABNORMAL LOW
Glucose, Bld: 112 mg/dL — ABNORMAL HIGH (ref 70–99)
Potassium: 4.4 mmol/L (ref 3.5–5.1)
Sodium: 137 mmol/L (ref 135–145)

## 2024-10-22 LAB — CBC
HCT: 33.8 % — ABNORMAL LOW (ref 36.0–46.0)
Hemoglobin: 10.3 g/dL — ABNORMAL LOW (ref 12.0–15.0)
MCH: 26.6 pg (ref 26.0–34.0)
MCHC: 30.5 g/dL (ref 30.0–36.0)
MCV: 87.3 fL (ref 80.0–100.0)
Platelets: 80 K/uL — ABNORMAL LOW (ref 150–400)
RBC: 3.87 MIL/uL (ref 3.87–5.11)
RDW: 17.3 % — ABNORMAL HIGH (ref 11.5–15.5)
WBC: 7.7 K/uL (ref 4.0–10.5)
nRBC: 0 % (ref 0.0–0.2)

## 2024-10-22 LAB — RESP PANEL BY RT-PCR (RSV, FLU A&B, COVID)  RVPGX2
Influenza A by PCR: NEGATIVE
Influenza B by PCR: NEGATIVE
Resp Syncytial Virus by PCR: NEGATIVE
SARS Coronavirus 2 by RT PCR: NEGATIVE

## 2024-10-22 LAB — GLUCOSE, CAPILLARY
Glucose-Capillary: 105 mg/dL — ABNORMAL HIGH (ref 70–99)
Glucose-Capillary: 220 mg/dL — ABNORMAL HIGH (ref 70–99)
Glucose-Capillary: 131 mg/dL — ABNORMAL HIGH (ref 70–99)
Glucose-Capillary: 221 mg/dL — ABNORMAL HIGH (ref 70–99)

## 2024-10-22 LAB — APTT: aPTT: 57 s — ABNORMAL HIGH (ref 24–36)

## 2024-10-22 MED ORDER — FUROSEMIDE 10 MG/ML IJ SOLN
40.0000 mg | Freq: Two times a day (BID) | INTRAMUSCULAR | Status: DC
  Administered 2024-10-22 – 2024-10-25 (×5): 40 mg via INTRAVENOUS
  Filled 2024-10-22 (×5): qty 4

## 2024-10-22 MED ORDER — FUROSEMIDE 10 MG/ML IJ SOLN
20.0000 mg | Freq: Once | INTRAMUSCULAR | Status: AC
  Administered 2024-10-22: 20 mg via INTRAVENOUS
  Filled 2024-10-22: qty 2

## 2024-10-22 MED ORDER — DIPHENHYDRAMINE HCL 25 MG PO CAPS
25.0000 mg | ORAL_CAPSULE | Freq: Once | ORAL | Status: AC
  Administered 2024-10-22: 25 mg via ORAL
  Filled 2024-10-22: qty 1

## 2024-10-22 MED ORDER — PHENOL 1.4 % MT LIQD
1.0000 | OROMUCOSAL | Status: DC | PRN
  Administered 2024-10-22: 1 via OROMUCOSAL
  Filled 2024-10-22: qty 177

## 2024-10-22 MED ORDER — HEPARIN (PORCINE) 25000 UT/250ML-% IV SOLN
950.0000 [IU]/h | INTRAVENOUS | Status: DC
  Administered 2024-10-22: 800 [IU]/h via INTRAVENOUS
  Administered 2024-10-23: 850 [IU]/h via INTRAVENOUS
  Filled 2024-10-22 (×2): qty 250

## 2024-10-22 NOTE — Plan of Care (Signed)
" °  Problem: Education: Goal: Knowledge of General Education information will improve Description: Including pain rating scale, medication(s)/side effects and non-pharmacologic comfort measures Outcome: Progressing   Problem: Clinical Measurements: Goal: Ability to maintain clinical measurements within normal limits will improve Outcome: Progressing Goal: Will remain free from infection Outcome: Progressing   Problem: Nutrition: Goal: Adequate nutrition will be maintained Outcome: Progressing   Problem: Pain Managment: Goal: General experience of comfort will improve and/or be controlled Outcome: Progressing   Problem: Safety: Goal: Ability to remain free from injury will improve Outcome: Progressing   Problem: Fluid Volume: Goal: Ability to maintain a balanced intake and output will improve Outcome: Progressing   Problem: Metabolic: Goal: Ability to maintain appropriate glucose levels will improve Outcome: Progressing   "

## 2024-10-22 NOTE — Progress Notes (Addendum)
 "  Rounding Note   Patient Name: [name] Date of Encounter: 10/22/2024  [facility] Cardiologist: Alvan Carrier, MD   Subjective No acute overnight events. She is still feeling very weak this morning. Her husband states this is what actually brought her to the ED in the first place. She also reports feeling short of breath and is orthopneic. She is also complaining of a sore throat today.  Scheduled Meds:  amiodarone   200 mg Oral Daily   atorvastatin   40 mg Oral Daily   budesonide -glycopyrrolate -formoterol   2 puff Inhalation BID   Chlorhexidine  Gluconate Cloth  6 each Topical Daily   feeding supplement  237 mL Oral BID BM   furosemide   20 mg Intravenous Once   furosemide   40 mg Intravenous BID   Gerhardt's butt cream  1 Application Topical TID   HYDROcodone  bit-homatropine  5 mL Oral QHS   insulin  aspart  0-5 Units Subcutaneous QHS   insulin  aspart  0-9 Units Subcutaneous TID WC   levothyroxine   100 mcg Oral QAC breakfast   lidocaine  HCl (PF)  10 mL Intradermal Once   pantoprazole   40 mg Oral QAC breakfast   sodium chloride  flush  10-40 mL Intracatheter Q12H   Continuous Infusions:  heparin      PRN Meds: azelastine , guaiFENesin -dextromethorphan , hydrocortisone  cream, ipratropium-albuterol , meclizine , melatonin, menthol , phenol, polyethylene glycol, prochlorperazine , sodium chloride  flush   Vital Signs  Vitals:   10/21/24 1927 10/22/24 0500 10/22/24 0537 10/22/24 0750  BP: (!) 130/56  (!) 114/58 121/66  Pulse: 73  77 73  Resp: 18  19 19   Temp: 98.3 F (36.8 C)  98.7 F (37.1 C) 98 F (36.7 C)  TempSrc: Oral  Oral Oral  SpO2: 100%  96% 100%  Weight:  64.1 kg    Height:        Intake/Output Summary (Last 24 hours) at 10/22/2024 0948 Last data filed at 10/22/2024 9392 Gross per 24 hour  Intake 640 ml  Output 1150 ml  Net -510 ml      10/22/2024    5:00 AM 10/21/2024    5:00 AM 10/20/2024    5:00 AM  Last 3 Weights  Weight (lbs) 141 lb 5 oz 151  lb 0.2 oz 145 lb 8.1 oz  Weight (kg) 64.1 kg 68.5 kg 66 kg      Telemetry Normal sinus rhythm with rates in the 60s to 70s. - Personally Reviewed  ECG  No new ECG tracing today. - Personally Reviewed  Physical Exam  GEN: No acute distress. On 2L of O2 via nasal cannula. Neck: JVD improved from yesterday. Cardiac: RRR. III/ VI systolic murmur. Respiratory: No increased work of breathing. Crackles noted in bilateral bases. MS: No lower extremity edema. Neuro:  No focal deficits. Psych: Normal affect. Responds appropriately.  Labs High Sensitivity Troponin:  No results for input(s): TROPONINIHS in the last 720 hours.  Recent Labs  Lab 10/08/24 2147 10/09/24 0021  TRNPT 42* 41*       Chemistry Recent Labs  Lab 10/16/24 0517 10/17/24 0406 10/18/24 0527 10/19/24 0439 10/20/24 0344 10/21/24 1055  NA 136 136 135 131* 137 131*  K 3.7 3.0* 3.6 5.2* 4.7 5.9*  CL 94* 94* 94* 94* 99 98  CO2 29 28 30 27 28 22   GLUCOSE 148* 106* 92 119* 110* 182*  BUN 18 14 14 19 19 18   CREATININE 1.15* 1.04* 1.04* 1.15* 1.20* 1.25*  CALCIUM  8.5* 8.3* 8.3* 8.7* 9.0 9.2  MG 1.7  1.7 2.1 1.7  --   --   PROT 6.6  --   --   --   --   --   ALBUMIN  3.5  --   --   --   --   --   AST 35  --   --   --   --   --   ALT 43  --   --   --   --   --   ALKPHOS 73  --   --   --   --   --   BILITOT 1.0  --   --   --   --   --   GFRNONAA 48* 54* 54* 48* 46* 43*  ANIONGAP 12 14 11 10 10 11     Lipids No results for input(s): CHOL, TRIG, HDL, LABVLDL, LDLCALC, CHOLHDL in the last 168 hours.  Hematology Recent Labs  Lab 10/20/24 0344 10/21/24 0430 10/22/24 0321  WBC 8.1 9.4 7.7  RBC 4.25 4.20 3.87  HGB 11.3* 11.1* 10.3*  HCT 36.4 36.3 33.8*  MCV 85.6 86.4 87.3  MCH 26.6 26.4 26.6  MCHC 31.0 30.6 30.5  RDW 17.2* 17.2* 17.3*  PLT 87* 88* 80*   Thyroid  No results for input(s): TSH, FREET4 in the last 168 hours.  BNPNo results for input(s): BNP, PROBNP in the last 168 hours.   DDimer No results for input(s): DDIMER in the last 168 hours.   Radiology  CT CORONARY MORPH W/CTA COR W/SCORE W/CA W/CM &/OR WO/CM Addendum Date: 10/21/2024 ADDENDUM REPORT: 10/21/2024 21:18 EXAM: OVER-READ INTERPRETATION  CT CHEST The following report is an over-read performed by radiologist Dr. Vanetta Mortimer [facility] on 10/21/2024. This over-read does not include interpretation of cardiac or coronary anatomy or pathology. The coronary CTA interpretation by the cardiologist is attached. COMPARISON:  CT dated 10/21/2024. FINDINGS: Mild atherosclerotic calcification of the visualized aorta. Top-normal subcarinal lymph node measures 10 mm short axis. The visualized esophagus is grossly unremarkable. No mediastinal fluid collection. Partially visualized small right and trace left pleural effusions. Faint diffuse hazy density throughout the lungs most consistent with edema. Pneumonia is not excluded. No acute osseous pathology. IMPRESSION: 1. Partially visualized small right and trace left pleural effusions. 2. Faint diffuse hazy density throughout the lungs most consistent with edema. Pneumonia is not excluded. 3.  Aortic Atherosclerosis (ICD10-I70.0). Electronically Signed   By: Vanetta Chou M.D.   On: 10/21/2024 21:18   Result Date: 10/21/2024 CLINICAL DATA:  Severe bioprosthetic aortic valve stenosis. Severe bioprosthetic mitral valve stenosis. AVR with 21 mm Magna Ease 07/19/2013 and 25 mm Magna Ease at same time. EXAM: Cardiac TAVR CT TECHNIQUE: A non-contrast, gated CT scan was obtained with axial slices of 2.5 mm through the heart for aortic valve scoring. A 120 kV retrospective, gated, contrast cardiac scan was obtained. Gantry rotation speed was 250 msec and collimation was 0.6 mm. Nitroglycerin  was not given. A delayed scan was obtained to exclude left atrial appendage thrombus. The 3D dataset was reconstructed in systole with motion correction. The 3D data set was  reconstructed in 5% intervals of the 0-95% of the R-R cycle. Systolic and diastolic phases were analyzed on a dedicated workstation using MPR, MIP, and VRT modes. The patient received 100 cc of contrast. FINDINGS: Image quality: Excellent. Noise artifact is: Limited. Mitral prosthesis: A 25 mm Magna ease prosthesis is present in the mitral position. The leaflets and calcified and thickened. Two of the leaflets are fixed and immobile  consistent with structural deterioration. No evidence of valve thrombosis. The valve area is 0.79 cm2 by direct planimetry. ViV analysis: A valve in valve analysis was performed using a 26 mm S3. The neo-LVOT is 254 mm2, indicating low-risk for outflow traction obstruction. The aorto-mitral angle is 127 degrees which is favorable. Optimal coplanar projection: RAO 23 CRA 1 Aortic prosthesis: A 21 mm Magna ease prosthesis is present in the aortic position. The leaflets are calcified and thickened with restricted leaflet motion in systole. Findings consistent with structural deterioration. ViV analysis: A valve in valve analysis was performed using a 23 mm virtual transcatheter heart valve. The virtual valve to coronary (VTC) distance for the left main is 3.5 mm indicating high-risk for coronary obstruction. The RCA takes off above the virtual valve indicating low-risk for coronary obstruction. Optimal coplanar projection: LAO 11 CAU 2 Aorta: Sinus of Valsalva Measurements: Non-coronary: 27 mm Right-coronary: 27 mm Left-coronary: 27 mm Sinotubular Junction: 26 mm Ascending Thoracic Aorta: 30 mm Coronary Arteries: Normal coronary origin. Right dominance. This study did not use nitroglycerin  and stenosis severity could not be determined. Right Atrium: Right atrial size is severely dilated. Contrast reflux into the IVC consistent with elevated RA pressure. Right Ventricle: The right ventricular cavity is severely dilated. The septum is flattened in systole/diastole concerning for  volume/pressure overload. Left Atrium: Left atrial size is severely dilated. The LAA appears to be absent or surgically ligated, no filling defect. Left Ventricle: The ventricular cavity size is within normal limits. Pulmonary arteries: Dilated pulmonary artery suggestive of pulmonary hypertension. Pulmonary veins: Normal pulmonary venous drainage. Pericardium: Normal thickness with no significant effusion or calcium  present. Extra-cardiac findings: See attached radiology report for non-cardiac structures. IMPRESSION: 1. Bioprosthetic aortic and mitral prostheses with evidence of structural deterioration as detailed above. 2. ViV analysis favorable for TMVR with low risk of outflow tract obstruction. 3. ViV analysis indicates high risk for coronary obstruction for TAVR. Left main VTC 3.5 mm. 4. Severely dilated right ventricle and right atrium. Contrast reflux into the IVC consistent with elevated RA pressure. 5. Dilated pulmonary artery suggestive of pulmonary hypertension. 6. The septum is flattened in systole/diastole concerning for volume/pressure overload. Darryle T. Barbaraann, MD Electronically Signed: By: Darryle Barbaraann M.D. On: 10/21/2024 21:05   CT Angio Abd/Pel w/ and/or w/o Result Date: 10/21/2024 EXAM: CTA CHEST, ABDOMEN AND PELVIS WITH CONTRAST 10/21/2024 03:37:00 PM TECHNIQUE: CTA of the chest was performed with the administration of intravenous contrast. CTA of the abdomen and pelvis was performed with the administration of intravenous contrast. Multiplanar reformatted images are provided for review. MIP images are provided for review. Automated exposure control, iterative reconstruction, and/or weight based adjustment of the mA/kV was utilized to reduce the radiation dose to as low as reasonably achievable. COMPARISON: CT abdomen/pelvis dated 09/11/2024 and CTA chest dated 01/25/2024. CLINICAL HISTORY: Aortic valve replacement (TAVR), pre-op eval. FINDINGS: VASCULATURE: AORTA: No acute finding. No  abdominal aortic aneurysm. No dissection. Mild thoracic aortic atherosclerosis. PULMONARY ARTERIES: Study not tailored for evaluation of the pulmonary arteries, but there is no pulmonary embolism to the lobar level. GREAT VESSELS OF AORTIC ARCH: No acute finding. No dissection. No arterial occlusion or significant stenosis. CELIAC TRUNK: Mild atherosclerotic calcifications at the origin of the celiac artery, patent. SUPERIOR MESENTERIC ARTERY: Mild atherosclerotic calcifications at the origin of the SMA, patent. INFERIOR MESENTERIC ARTERY: IMA is patent. RENAL ARTERIES: Mild atherosclerotic calcifications at the origin of the bilateral renal arteries, patent. ILIAC ARTERIES: Atherosclerotic calcifications of the bilateral iliac arteries,  patent. CHEST: MEDIASTINUM: Cardiomegaly. Status post aortic and mitral valve replacement. Small mediastinal nodes, including an 11 mm short axis right azygoesophageal recess node, likely reactive. LUNGS AND PLEURA: Lung apices excluded. Small bilateral pleural effusions, partially loculated. Left pleural calcification, chronic, suggesting prior pleurodesis. Faint perihilar ground glass opacity suggests very mild interstitial edema. 4 mm subpleural left lower lobe nodule (image 52), unchanged since at least 2023, benign. No follow-up is recommended. No pneumothorax. THORACIC BONES AND SOFT TISSUES: Median sternotomy. Radiation changes in the anterior right hemithorax. Status post right breast lumpectomy. Degenerative changes of the thoracolumbar spine. ABDOMEN AND PELVIS: LIVER: The liver is unremarkable. GALLBLADDER AND BILE DUCTS: Gallbladder is unremarkable. No biliary ductal dilatation. SPLEEN: The spleen is unremarkable. PANCREAS: The pancreas is unremarkable. ADRENAL GLANDS: Bilateral adrenal glands demonstrate no acute abnormality. KIDNEYS, URETERS AND BLADDER: Bilateral renal cortical scarring/atrophy. No stones in the kidneys or ureters. No hydronephrosis. No perinephric or  periureteral stranding. Urinary bladder is unremarkable. GI AND BOWEL: Stomach and duodenal sweep demonstrate no acute abnormality. Normal appendix (image 134). Sigmoid diverticulosis, without evidence of diverticulitis. Mildly increased rectal stool burden with mild presacral stranding but no findings suspicious for stercoral colitis. There is no bowel obstruction. No abnormal bowel wall thickening or distension. REPRODUCTIVE: Uterus is unremarkable. PERITONEUM AND RETROPERITONEUM: No ascites or free air. LYMPH NODES: No lymphadenopathy. ABDOMINAL BONES AND SOFT TISSUES: Degenerative changes of the thoracolumbar spine. No acute soft tissue abnormality. IMPRESSION: 1. No thoracoabdominal aortic aneurysm or dissection. No pulmonary embolism. 2. Atherosclerotic calcifications of the abdominopelvic vasculature, which remain patent. 3. Cardiomegaly. Very mild interstitial edema is possible. Small bilateral pleural effusions, partially loculated. 4. Status post aortic and mitral valve replacement. 5. Additional ancillary findings, as above. Electronically signed by: Pinkie Pebbles MD 10/21/2024 07:40 PM EST RP Workstation: [id number]   CT ANGIO CHEST AORTA W/CM & OR WO/CM Result Date: 10/21/2024 EXAM: CTA CHEST, ABDOMEN AND PELVIS WITH CONTRAST 10/21/2024 03:37:00 PM TECHNIQUE: CTA of the chest was performed with the administration of intravenous contrast. CTA of the abdomen and pelvis was performed with the administration of intravenous contrast. Multiplanar reformatted images are provided for review. MIP images are provided for review. Automated exposure control, iterative reconstruction, and/or weight based adjustment of the mA/kV was utilized to reduce the radiation dose to as low as reasonably achievable. COMPARISON: CT abdomen/pelvis dated 09/11/2024 and CTA chest dated 01/25/2024. CLINICAL HISTORY: Aortic valve replacement (TAVR), pre-op eval. FINDINGS: VASCULATURE: AORTA: No acute finding. No abdominal  aortic aneurysm. No dissection. Mild thoracic aortic atherosclerosis. PULMONARY ARTERIES: Study not tailored for evaluation of the pulmonary arteries, but there is no pulmonary embolism to the lobar level. GREAT VESSELS OF AORTIC ARCH: No acute finding. No dissection. No arterial occlusion or significant stenosis. CELIAC TRUNK: Mild atherosclerotic calcifications at the origin of the celiac artery, patent. SUPERIOR MESENTERIC ARTERY: Mild atherosclerotic calcifications at the origin of the SMA, patent. INFERIOR MESENTERIC ARTERY: IMA is patent. RENAL ARTERIES: Mild atherosclerotic calcifications at the origin of the bilateral renal arteries, patent. ILIAC ARTERIES: Atherosclerotic calcifications of the bilateral iliac arteries, patent. CHEST: MEDIASTINUM: Cardiomegaly. Status post aortic and mitral valve replacement. Small mediastinal nodes, including an 11 mm short axis right azygoesophageal recess node, likely reactive. LUNGS AND PLEURA: Lung apices excluded. Small bilateral pleural effusions, partially loculated. Left pleural calcification, chronic, suggesting prior pleurodesis. Faint perihilar ground glass opacity suggests very mild interstitial edema. 4 mm subpleural left lower lobe nodule (image 52), unchanged since at least 2023, benign. No follow-up is recommended. No  pneumothorax. THORACIC BONES AND SOFT TISSUES: Median sternotomy. Radiation changes in the anterior right hemithorax. Status post right breast lumpectomy. Degenerative changes of the thoracolumbar spine. ABDOMEN AND PELVIS: LIVER: The liver is unremarkable. GALLBLADDER AND BILE DUCTS: Gallbladder is unremarkable. No biliary ductal dilatation. SPLEEN: The spleen is unremarkable. PANCREAS: The pancreas is unremarkable. ADRENAL GLANDS: Bilateral adrenal glands demonstrate no acute abnormality. KIDNEYS, URETERS AND BLADDER: Bilateral renal cortical scarring/atrophy. No stones in the kidneys or ureters. No hydronephrosis. No perinephric or  periureteral stranding. Urinary bladder is unremarkable. GI AND BOWEL: Stomach and duodenal sweep demonstrate no acute abnormality. Normal appendix (image 134). Sigmoid diverticulosis, without evidence of diverticulitis. Mildly increased rectal stool burden with mild presacral stranding but no findings suspicious for stercoral colitis. There is no bowel obstruction. No abnormal bowel wall thickening or distension. REPRODUCTIVE: Uterus is unremarkable. PERITONEUM AND RETROPERITONEUM: No ascites or free air. LYMPH NODES: No lymphadenopathy. ABDOMINAL BONES AND SOFT TISSUES: Degenerative changes of the thoracolumbar spine. No acute soft tissue abnormality. IMPRESSION: 1. No thoracoabdominal aortic aneurysm or dissection. No pulmonary embolism. 2. Atherosclerotic calcifications of the abdominopelvic vasculature, which remain patent. 3. Cardiomegaly. Very mild interstitial edema is possible. Small bilateral pleural effusions, partially loculated. 4. Status post aortic and mitral valve replacement. 5. Additional ancillary findings, as above. Electronically signed by: Pinkie Pebbles MD 10/21/2024 07:40 PM EST RP Workstation: [id number]    Cardiac Studies  Limited Echocardiogram 10/15/2024: Impressions: 1. Left ventricular ejection fraction, by estimation, is 45%. The left  ventricle has mildly decreased function. Left ventricular endocardial  border not optimally defined to evaluate regional wall motion. Left  ventricular diastolic function could not be  evaluated. There is the interventricular septum is flattened in systole  and diastole, consistent with right ventricular pressure and volume  overload.   2. Right ventricular systolic function is severely reduced. The right  ventricular size is moderately enlarged. There is moderately elevated  pulmonary artery systolic pressure. The estimated right ventricular  systolic pressure is 54.8 mmHg.   3. The inferior vena cava is normal in size with <50%  respiratory  variability, suggesting right atrial pressure of 8 mmHg.  _______________   Limited Echocardiogram 10/17/2024: Impressions: 1. Limited study.   2. Left ventricular ejection fraction, by estimation, is 60 to 65%. The  left ventricle has normal function. The left ventricle has no regional  wall motion abnormalities. There is the interventricular septum is  flattened in systole and diastole,  consistent with right ventricular pressure and volume overload.   3. RVSP not estimated. Right ventricular systolic function is severely  reduced. The right ventricular size is moderately enlarged.   4. Papillary muscle calcification noted. Trivial mitral valve  regurgitation. Moderate to severe mitral stenosis. The mean mitral valve  gradient is 10.0 mmHg. There is a 25 mm Magna Ease bioprosthetic valve  present in the mitral position.   5. There is mild calcification of the prosthetic aortic valve. Aortic  valve regurgitation is at least mild, not clear that PHT is accurate and  jet somewhat obscured by transmitral flow jet. Moderate to severe aortic  valve stenosis. There is a 21 mm  Magna Ease bioprosthetic valve present in the aortic position. Aortic  valve mean gradient measures 21.3 mmHg. Dimentionless index 0.25.   Comparison(s): If clinically reasonable, consider TEE for further  evaluation of prosthetic valvular function. Both mitral and aortic mean  gradients have increased compared with prior complete study.   Patient Profile   79 y.o. female  with a  history of chronic HFpEF, rheumatic heart disease s/p bioproshetic AVR and MVR in 2014, paroxysmal atrial fibrillation/flutter on Eliquis , CVA hypertension, hyperlipidemia, type 2 diabetes mellitus, CKD stage III, and breast cancer who was admitted at [facility] on 10/09/2023 for acute hypoxic respiratory failure secondary to acute on chronic CHF after presenting with sudden on set of shortness of breath. She was diuresed  with IV Lasix . Echo showed normal LV function and severe RV dysfunction as well new moderate to severe stenosis of both MVR and AVR which was new compared to 07/2024. Therefore, she was transferred to [facility] for CT cardiac TAVR protocol.   Assessment & Plan   Acute on Chronic HFpEF Severe RV Failure Patient was admitted with sudden onset of shortness of breath.  Pro BNP elevated at 5,574. Chest x-ray showed mild central pulmonary vascular congestion with interstitial edema. Limited Echo on 10/15/2024 showed LVEF of 45% and severe RV dysfunction with flattened interventricular septum in systole and diastole consistent with RV pressure and volume overload. Repeat limited Echo on 10/17/2024 showed LVEF of 60-65% with normal wall motion, severe RV dysfunction with interventricular septum in systole and diastole consistent with RV pressure and volume overload, and new moderate to severe stenosis of both MVR and AVR compared to last Echo in 07/2024. She was started on IV Lasix . Net negative 5.65 L this admission. Weight have fluctuated this admissio (do not think they are accurate). - Crackles noted in bilateral bases this morning and she reports dyspnea and orthopnea. - Complete Echo was performed yesterday but final read pending.  - Will increase IV Lasix  40mg  twice daily. - No Spironolactone given hyperkalemia. - No SGLT2 inhibitor given she is prone to UTIs. - Continue to monitor daily weights, strict I/Os, and renal function.   Rheumatic Heart Disease S/p AVR and MVR  Patient has a history of rheumatic heart disease and underwent bioprosthetic AVR and MVR in 2014. Echo this admission showed new moderate to severe stenosis of both AVR (mean gradient 21.3 mmHg) and MVR (mean gradient 10.0 mmHg) compared to last Echo in 07/2024. TAVR CT showed structural deterioration of both valves. ViV analysis favorable for TMVR with low risk of outflow tract obstruction. ViV analysis indicates high risk for  coronary obstruction for TAVR. - Plan is for R/ LHC prior to discharge. Will discuss timing of this with MD.    Elevated Troponin High-sensitivity troponin minimally elevated and flat at 42 >> 41. Echo as above.  - No chest pain.  - Troponin elevation is not consistent with ACS. Consistent with demand ischemic secondary to acute on chronic CHF. However, plan is for likely cardiac catheterization this admission due to valvular disease.   Paroxysmal Atrial Fibrillation/ Flutter Maintaining sinus rhythm. She has some prolonging PR that look consistent with Wenckebach at times. - Continue Amiodarone  200mg  daily. - Currently on Eliquis  5mg  twice daily. Will stop this and place on IV Heparin  in anticipation for R/ LHC prior to discharge.   Hypertension BP mostly well controlled.  - Not currently on any antihypertensives.   Hyperlipidemia - Continue Lipitor 40mg  daily.   Type 2 Diabetes Mellitus Management per primary team.   CKD Stage III Creatinine 1.55 on admission. Baseline creatinine around 1.1 to 1.3. - Creatinine stable at 1.25 yesterday. - Will recheck BMET.  Hyperkalemia Potassium was 5.9 yesterday. Treated with Lokelma . - Will repeat BMET today.  Otherwise, per primary team: - Weakness - UTI: completed course of antibiotics - Chronic anemia - Thrombocytopenia  For questions or updates, please contact [facility] Please consult [email or web address] for contact info under   Signed, Callie E Goodrich, PA-C  10/22/2024, 9:48 AM     ATTENDING ATTESTATION:  After conducting a review of all available clinical information with the care team, interviewing the patient, and performing a physical exam, I agree with the findings and plan described in this note with adjustments as indicated below which were discussed and enacted by staff above.   GEN: No acute distress, AO x 3 HEENT:  MMM, JVP ~8 Cardiac: RRR, 3/6 SEM Respiratory: Rales L base > R base GI: Soft,  nontender, non-distended  MS: No edema; No deformity. Neuro:  Nonfocal  Vasc:  +2 radial pulses  Patient volume overloaded today after TAVR protocol CTA yesterday.  Feels much more dyspneic today.  Endorses early satiety as well.  Denies chest pain.  Acute on chronic systolic and diastolic heart failure History of AVR and MVR in 2014 TTE yesterday with RV dilatation and dysfunction, mean gradient across mitral valve of 14 mmHg Biggest issue seems to be right sided heart failure due to bioprosthetic mitral valve degeneration due to stenosis Will obtain advanced heart failure consult for volume management and optimization. 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 PAF Remains in normal sinus rhythm On heparin  drip for now in preparation for coronary angiography and right heart catheterization during this admission T2DM Continue anticoagulation Atorvastatin  40 mg Not a good candidate for SGLT2 inhibitor due to UTI Consider ARB once diuresed   Discussed at length with patient and husband at bedside.  Laguana Desautel, MD Pager [phone]   "

## 2024-10-22 NOTE — Progress Notes (Signed)
 Arrived for timed PTT lab draw.  Patient has heparin  infusing via midline catheter and will need to have lab stick for this level for accuracy.  Bedside nurse Gilmore SQUIBB, RN made aware and will contact phlebotomy.

## 2024-10-22 NOTE — Progress Notes (Addendum)
 " PROGRESS NOTE    [name]  FMW:[id number] DOB: 09/12/1946 DOA: 10/08/2024 PCP: Sheryle Carwin, MD   Brief Narrative:  79 y.o. female with medical history significant for paroxysmal A-fib on Eliquis , chronic HFpEF, prior CVA, carotid artery stenosis, valvular heart disease, CKD 3B, hypertension, hyperlipidemia, type 2 diabetes, hypothyroidism, COPD, bioprosthetic aortic and mitral valve 07/18/2013 presenting with shortness of breath that began on the evening of 10/09/2023.  She has noted some lower extremity edema.  She was admitted with acute hypoxemic respiratory failure in the setting of acute on chronic HFpEF with new severe RV dysfunction.  She is currently being diuresed with IV Lasix  and cardiology is now following with recommendations for VQ scan and possible need for heart catheterization during this admission.  She is also noted to have E. coli UTI and was started on cefadroxil  with sensitivity noted.   Cardiology following and has repeated echocardiogram on 1/15 with moderate to severe prosthetic MS and AS.  Transferred to [name] for CT cardiac TAVR protocol done on 1/19. It showed  bioprosthetic Aortic and mitral valve structural deterioration.  Assessment & Plan:  Principal Problem:   Acute on chronic diastolic (congestive) heart failure (HCC) Active Problems:   Upper airway cough syndrome in pt with mild cough variant asthma    PAF (paroxysmal atrial fibrillation) (HCC)   Long term current use of amiodarone    Long term (current) use of anticoagulants   Acute combined systolic and diastolic heart failure (HCC)   Severe aortic stenosis   S/P AVR (aortic valve replacement)   Right ventricular failure (HCC)   Acute hypoxic respiratory failure (HCC)   S/P MVR (mitral valve repair)   S/P AVR    Acute respiratory failure with hypoxia,POA: - Multifactorial including COPD exacerbation and CHF - Stable on 2 L nasal cannula>>96% - 1/8 CT chest--new trace L-pleural eff and small  loculated R-effusion; bilateral GGO - 1/13 CXR--slight increase R-pleural effusion.  Chronic interstitial prominence -Continue diuresis with IV Lasix  40 mg twice daily per cardiology recommendations   Acute systolic and diastolic heart failure with biventricular heart failure in setting of moderate to severe prosthetic MS and AS, NYHA IV - LVEF 45% with severe RV systolic dysfunction noted -Continue IV Lasix  40 mg (increased from 20 mg on 1/20) twice daily with close monitoring of renal function and electrolytes. -VQ scan negative for PE - Repeat 2D echocardiogram showing prosthetic valve degeneration on 1/15 -Transferred to [facility] on 1/16 for CT cardiac TAVR protocol per cardiology -CT TAVR protocol on Monday, 1/19 showed bioprosthetic Aortic and mitral valve structural deterioration.ViV analysis favorable for TMVR with low risk of outflow tract obstruction. ViV analysis indicates high risk for coronary obstruction for TAVR. Left main VTC 3.5 mm. -Needs R/LHC prior to discharge after adequate diuresis is achieved. Discussed that with cardiology on 1/20.     E coli UTI - Completed course of cefadroxil    CKD stage IIIb - Baseline creatinine 1.1-1.4 - Monitor with diuresis   Paroxysmal atrial fibrillation - Held apixaban  and started on heparin  drip on 1/20 - continue amiodarone    Elevated troponin - Troponin trend is flat, not consistent with ACS - Troponin 42>>41   Mixed hyperlipidemia - Continue statin   Diabetes mellitus type 2 - NovoLog  sliding scale - 09/12/2024 hemoglobin A1c 6.3 -  elevated CBGs secondary to steroids - novolog  sliding scale   Elevated D-dimer - VQ scan negative for PE  Disposition: She was living at home with her husband. She  will likely need SNF placement on discharge.  DVT prophylaxis: Heparin  drip     Code Status: Limited: Do not attempt resuscitation (DNR) -DNR-LIMITED -Do Not Intubate/DNI  Family Communication:  Husband is at the bedside Status  is: Inpatient Remains inpatient appropriate because: Acute CHF, Hypoxia    Subjective:  Only active complaint is generalized weakness. Denies shortness of breath. No chest pain.  Examination:  General exam: Appears calm and comfortable, nasal oxygen  cannula in place  Respiratory system: Clear to auscultation. Respiratory effort normal. Cardiovascular system: S1 & S2 heard, RRR. No JVD, murmurs, rubs, gallops or clicks. 2+ bilateral pedal edema. Gastrointestinal system: Abdomen is nondistended, soft and nontender. No organomegaly or masses felt. Normal bowel sounds heard. Central nervous system: Alert and oriented. No focal neurological deficits. Extremities: Symmetric 5 x 5 power. Skin: No rashes, lesions or ulcers      Diet Orders (From admission, onward)     Start     Ordered   10/08/24 2317  Diet heart healthy/carb modified Room service appropriate? Yes; Fluid consistency: Thin  Diet effective now       Question Answer Comment  Diet-HS Snack? Nothing   Room service appropriate? Yes   Fluid consistency: Thin      10/08/24 2316            Objective: Vitals:   10/21/24 1927 10/22/24 0500 10/22/24 0537 10/22/24 0750  BP: (!) 130/56  (!) 114/58 121/66  Pulse: 73  77 73  Resp: 18  19 19   Temp: 98.3 F (36.8 C)  98.7 F (37.1 C) 98 F (36.7 C)  TempSrc: Oral  Oral Oral  SpO2: 100%  96% 100%  Weight:  64.1 kg    Height:        Intake/Output Summary (Last 24 hours) at 10/22/2024 0942 Last data filed at 10/22/2024 9392 Gross per 24 hour  Intake 640 ml  Output 1150 ml  Net -510 ml   Filed Weights   10/20/24 0500 10/21/24 0500 10/22/24 0500  Weight: 66 kg 68.5 kg 64.1 kg    Scheduled Meds:  amiodarone   200 mg Oral Daily   atorvastatin   40 mg Oral Daily   budesonide -glycopyrrolate -formoterol   2 puff Inhalation BID   Chlorhexidine  Gluconate Cloth  6 each Topical Daily   feeding supplement  237 mL Oral BID BM   furosemide   20 mg Intravenous BID   Gerhardt's  butt cream  1 Application Topical TID   HYDROcodone  bit-homatropine  5 mL Oral QHS   insulin  aspart  0-5 Units Subcutaneous QHS   insulin  aspart  0-9 Units Subcutaneous TID WC   levothyroxine   100 mcg Oral QAC breakfast   lidocaine  HCl (PF)  10 mL Intradermal Once   pantoprazole   40 mg Oral QAC breakfast   sodium chloride  flush  10-40 mL Intracatheter Q12H   Continuous Infusions:  Nutritional status     Body mass index is 25.85 kg/m.  Data Reviewed:   CBC: Recent Labs  Lab 10/18/24 0527 10/19/24 0439 10/20/24 0344 10/21/24 0430 10/22/24 0321  WBC 7.8 8.3 8.1 9.4 7.7  HGB 10.0* 10.8* 11.3* 11.1* 10.3*  HCT 33.3* 35.0* 36.4 36.3 33.8*  MCV 86.0 85.4 85.6 86.4 87.3  PLT 104* 93* 87* 88* 80*   Basic Metabolic Panel: Recent Labs  Lab 10/16/24 0517 10/17/24 0406 10/18/24 0527 10/19/24 0439 10/20/24 0344 10/21/24 1055  NA 136 136 135 131* 137 131*  K 3.7 3.0* 3.6 5.2* 4.7 5.9*  CL 94* 94* 94*  94* 99 98  CO2 29 28 30 27 28 22   GLUCOSE 148* 106* 92 119* 110* 182*  BUN 18 14 14 19 19 18   CREATININE 1.15* 1.04* 1.04* 1.15* 1.20* 1.25*  CALCIUM  8.5* 8.3* 8.3* 8.7* 9.0 9.2  MG 1.7 1.7 2.1 1.7  --   --    GFR: Estimated Creatinine Clearance: 31.6 mL/min (A) (by C-G formula based on SCr of 1.25 mg/dL (H)). Liver Function Tests: Recent Labs  Lab 10/16/24 0517  AST 35  ALT 43  ALKPHOS 73  BILITOT 1.0  PROT 6.6  ALBUMIN  3.5   No results for input(s): LIPASE, AMYLASE in the last 168 hours. No results for input(s): AMMONIA in the last 168 hours. Coagulation Profile: No results for input(s): INR, PROTIME in the last 168 hours. Cardiac Enzymes: No results for input(s): CKTOTAL, CKMB, CKMBINDEX, TROPONINI in the last 168 hours. BNP (last 3 results) Recent Labs    07/24/24 0438 09/11/24 1824 10/08/24 2147  PROBNP 2,155.0* 5,666.0* 5,574.0*   HbA1C: No results for input(s): HGBA1C in the last 72 hours. CBG: Recent Labs  Lab 10/21/24 0817  10/21/24 1144 10/21/24 1643 10/21/24 2130 10/22/24 0731  GLUCAP 138* 178* 125* 191* 105*   Lipid Profile: No results for input(s): CHOL, HDL, LDLCALC, TRIG, CHOLHDL, LDLDIRECT in the last 72 hours. Thyroid  Function Tests: No results for input(s): TSH, T4TOTAL, FREET4, T3FREE, THYROIDAB in the last 72 hours. Anemia Panel: No results for input(s): VITAMINB12, FOLATE, FERRITIN, TIBC, IRON, RETICCTPCT in the last 72 hours. Sepsis Labs: No results for input(s): PROCALCITON, LATICACIDVEN in the last 168 hours.  Recent Results (from the past 240 hours)  Urine Culture (for pregnant, neutropenic or urologic patients or patients with an indwelling urinary catheter)     Status: Abnormal   Collection Time: 10/12/24  2:02 PM   Specimen: Urine, Catheterized  Result Value Ref Range Status   Specimen Description   Final    URINE, CATHETERIZED Performed at [facility], [address], [place], [place] [address]    Special Requests   Final    NONE Performed at [facility], [address], [place], [place] [address]    Culture 60,000 COLONIES/mL ESCHERICHIA COLI (A)  Final   Report Status 10/15/2024 FINAL  Final   Organism ID, Bacteria ESCHERICHIA COLI (A)  Final      Susceptibility   Escherichia coli - MIC*    AMPICILLIN  >=32 RESISTANT Resistant     CEFAZOLIN  (URINE) Value in next row Sensitive      4 SENSITIVEThis is a modified FDA-approved test that has been validated and its performance characteristics determined by the reporting laboratory.  This laboratory is certified under the Clinical Laboratory Improvement Amendments CLIA as qualified to perform high complexity clinical laboratory testing.    CEFEPIME Value in next row Sensitive      4 SENSITIVEThis is a modified FDA-approved test that has been validated and its performance characteristics determined by the reporting laboratory.  This laboratory is certified under the Clinical Laboratory Improvement  Amendments CLIA as qualified to perform high complexity clinical laboratory testing.    ERTAPENEM Value in next row Sensitive      4 SENSITIVEThis is a modified FDA-approved test that has been validated and its performance characteristics determined by the reporting laboratory.  This laboratory is certified under the Clinical Laboratory Improvement Amendments CLIA as qualified to perform high complexity clinical laboratory testing.    CEFTRIAXONE  Value in next row Sensitive      4  SENSITIVEThis is a modified FDA-approved test that has been validated and its performance characteristics determined by the reporting laboratory.  This laboratory is certified under the Clinical Laboratory Improvement Amendments CLIA as qualified to perform high complexity clinical laboratory testing.    CIPROFLOXACIN Value in next row Sensitive      4 SENSITIVEThis is a modified FDA-approved test that has been validated and its performance characteristics determined by the reporting laboratory.  This laboratory is certified under the Clinical Laboratory Improvement Amendments CLIA as qualified to perform high complexity clinical laboratory testing.    GENTAMICIN Value in next row Sensitive      4 SENSITIVEThis is a modified FDA-approved test that has been validated and its performance characteristics determined by the reporting laboratory.  This laboratory is certified under the Clinical Laboratory Improvement Amendments CLIA as qualified to perform high complexity clinical laboratory testing.    NITROFURANTOIN  Value in next row Sensitive      4 SENSITIVEThis is a modified FDA-approved test that has been validated and its performance characteristics determined by the reporting laboratory.  This laboratory is certified under the Clinical Laboratory Improvement Amendments CLIA as qualified to perform high complexity clinical laboratory testing.    TRIMETH/SULFA Value in next row Sensitive      4 SENSITIVEThis is a modified  FDA-approved test that has been validated and its performance characteristics determined by the reporting laboratory.  This laboratory is certified under the Clinical Laboratory Improvement Amendments CLIA as qualified to perform high complexity clinical laboratory testing.    AMPICILLIN /SULBACTAM Value in next row Resistant      4 SENSITIVEThis is a modified FDA-approved test that has been validated and its performance characteristics determined by the reporting laboratory.  This laboratory is certified under the Clinical Laboratory Improvement Amendments CLIA as qualified to perform high complexity clinical laboratory testing.    PIP/TAZO Value in next row Sensitive      <=4 SENSITIVEThis is a modified FDA-approved test that has been validated and its performance characteristics determined by the reporting laboratory.  This laboratory is certified under the Clinical Laboratory Improvement Amendments CLIA as qualified to perform high complexity clinical laboratory testing.    MEROPENEM Value in next row Sensitive      <=4 SENSITIVEThis is a modified FDA-approved test that has been validated and its performance characteristics determined by the reporting laboratory.  This laboratory is certified under the Clinical Laboratory Improvement Amendments CLIA as qualified to perform high complexity clinical laboratory testing.    * 60,000 COLONIES/mL ESCHERICHIA COLI         Radiology Studies: CT CORONARY MORPH W/CTA COR W/SCORE W/CA W/CM &/OR WO/CM Addendum Date: 10/21/2024 ADDENDUM REPORT: 10/21/2024 21:18 EXAM: OVER-READ INTERPRETATION  CT CHEST The following report is an over-read performed by radiologist Dr. Vanetta Mortimer [facility] on 10/21/2024. This over-read does not include interpretation of cardiac or coronary anatomy or pathology. The coronary CTA interpretation by the cardiologist is attached. COMPARISON:  CT dated 10/21/2024. FINDINGS: Mild atherosclerotic calcification of the  visualized aorta. Top-normal subcarinal lymph node measures 10 mm short axis. The visualized esophagus is grossly unremarkable. No mediastinal fluid collection. Partially visualized small right and trace left pleural effusions. Faint diffuse hazy density throughout the lungs most consistent with edema. Pneumonia is not excluded. No acute osseous pathology. IMPRESSION: 1. Partially visualized small right and trace left pleural effusions. 2. Faint diffuse hazy density throughout the lungs most consistent with edema. Pneumonia is not excluded. 3.  Aortic Atherosclerosis (ICD10-I70.0). Electronically  Signed   By: Arash  Radparvar M.D.   On: 10/21/2024 21:18   Result Date: 10/21/2024 CLINICAL DATA:  Severe bioprosthetic aortic valve stenosis. Severe bioprosthetic mitral valve stenosis. AVR with 21 mm Magna Ease 07/19/2013 and 25 mm Magna Ease at same time. EXAM: Cardiac TAVR CT TECHNIQUE: A non-contrast, gated CT scan was obtained with axial slices of 2.5 mm through the heart for aortic valve scoring. A 120 kV retrospective, gated, contrast cardiac scan was obtained. Gantry rotation speed was 250 msec and collimation was 0.6 mm. Nitroglycerin  was not given. A delayed scan was obtained to exclude left atrial appendage thrombus. The 3D dataset was reconstructed in systole with motion correction. The 3D data set was reconstructed in 5% intervals of the 0-95% of the R-R cycle. Systolic and diastolic phases were analyzed on a dedicated workstation using MPR, MIP, and VRT modes. The patient received 100 cc of contrast. FINDINGS: Image quality: Excellent. Noise artifact is: Limited. Mitral prosthesis: A 25 mm Magna ease prosthesis is present in the mitral position. The leaflets and calcified and thickened. Two of the leaflets are fixed and immobile consistent with structural deterioration. No evidence of valve thrombosis. The valve area is 0.79 cm2 by direct planimetry. ViV analysis: A valve in valve analysis was performed  using a 26 mm S3. The neo-LVOT is 254 mm2, indicating low-risk for outflow traction obstruction. The aorto-mitral angle is 127 degrees which is favorable. Optimal coplanar projection: RAO 23 CRA 1 Aortic prosthesis: A 21 mm Magna ease prosthesis is present in the aortic position. The leaflets are calcified and thickened with restricted leaflet motion in systole. Findings consistent with structural deterioration. ViV analysis: A valve in valve analysis was performed using a 23 mm virtual transcatheter heart valve. The virtual valve to coronary (VTC) distance for the left main is 3.5 mm indicating high-risk for coronary obstruction. The RCA takes off above the virtual valve indicating low-risk for coronary obstruction. Optimal coplanar projection: LAO 11 CAU 2 Aorta: Sinus of Valsalva Measurements: Non-coronary: 27 mm Right-coronary: 27 mm Left-coronary: 27 mm Sinotubular Junction: 26 mm Ascending Thoracic Aorta: 30 mm Coronary Arteries: Normal coronary origin. Right dominance. This study did not use nitroglycerin  and stenosis severity could not be determined. Right Atrium: Right atrial size is severely dilated. Contrast reflux into the IVC consistent with elevated RA pressure. Right Ventricle: The right ventricular cavity is severely dilated. The septum is flattened in systole/diastole concerning for volume/pressure overload. Left Atrium: Left atrial size is severely dilated. The LAA appears to be absent or surgically ligated, no filling defect. Left Ventricle: The ventricular cavity size is within normal limits. Pulmonary arteries: Dilated pulmonary artery suggestive of pulmonary hypertension. Pulmonary veins: Normal pulmonary venous drainage. Pericardium: Normal thickness with no significant effusion or calcium  present. Extra-cardiac findings: See attached radiology report for non-cardiac structures. IMPRESSION: 1. Bioprosthetic aortic and mitral prostheses with evidence of structural deterioration as detailed  above. 2. ViV analysis favorable for TMVR with low risk of outflow tract obstruction. 3. ViV analysis indicates high risk for coronary obstruction for TAVR. Left main VTC 3.5 mm. 4. Severely dilated right ventricle and right atrium. Contrast reflux into the IVC consistent with elevated RA pressure. 5. Dilated pulmonary artery suggestive of pulmonary hypertension. 6. The septum is flattened in systole/diastole concerning for volume/pressure overload. Darryle T. Barbaraann, MD Electronically Signed: By: Darryle Barbaraann M.D. On: 10/21/2024 21:05   CT Angio Abd/Pel w/ and/or w/o Result Date: 10/21/2024 EXAM: CTA CHEST, ABDOMEN AND PELVIS WITH CONTRAST 10/21/2024 03:37:00  PM TECHNIQUE: CTA of the chest was performed with the administration of intravenous contrast. CTA of the abdomen and pelvis was performed with the administration of intravenous contrast. Multiplanar reformatted images are provided for review. MIP images are provided for review. Automated exposure control, iterative reconstruction, and/or weight based adjustment of the mA/kV was utilized to reduce the radiation dose to as low as reasonably achievable. COMPARISON: CT abdomen/pelvis dated 09/11/2024 and CTA chest dated 01/25/2024. CLINICAL HISTORY: Aortic valve replacement (TAVR), pre-op eval. FINDINGS: VASCULATURE: AORTA: No acute finding. No abdominal aortic aneurysm. No dissection. Mild thoracic aortic atherosclerosis. PULMONARY ARTERIES: Study not tailored for evaluation of the pulmonary arteries, but there is no pulmonary embolism to the lobar level. GREAT VESSELS OF AORTIC ARCH: No acute finding. No dissection. No arterial occlusion or significant stenosis. CELIAC TRUNK: Mild atherosclerotic calcifications at the origin of the celiac artery, patent. SUPERIOR MESENTERIC ARTERY: Mild atherosclerotic calcifications at the origin of the SMA, patent. INFERIOR MESENTERIC ARTERY: IMA is patent. RENAL ARTERIES: Mild atherosclerotic calcifications at the origin  of the bilateral renal arteries, patent. ILIAC ARTERIES: Atherosclerotic calcifications of the bilateral iliac arteries, patent. CHEST: MEDIASTINUM: Cardiomegaly. Status post aortic and mitral valve replacement. Small mediastinal nodes, including an 11 mm short axis right azygoesophageal recess node, likely reactive. LUNGS AND PLEURA: Lung apices excluded. Small bilateral pleural effusions, partially loculated. Left pleural calcification, chronic, suggesting prior pleurodesis. Faint perihilar ground glass opacity suggests very mild interstitial edema. 4 mm subpleural left lower lobe nodule (image 52), unchanged since at least 2023, benign. No follow-up is recommended. No pneumothorax. THORACIC BONES AND SOFT TISSUES: Median sternotomy. Radiation changes in the anterior right hemithorax. Status post right breast lumpectomy. Degenerative changes of the thoracolumbar spine. ABDOMEN AND PELVIS: LIVER: The liver is unremarkable. GALLBLADDER AND BILE DUCTS: Gallbladder is unremarkable. No biliary ductal dilatation. SPLEEN: The spleen is unremarkable. PANCREAS: The pancreas is unremarkable. ADRENAL GLANDS: Bilateral adrenal glands demonstrate no acute abnormality. KIDNEYS, URETERS AND BLADDER: Bilateral renal cortical scarring/atrophy. No stones in the kidneys or ureters. No hydronephrosis. No perinephric or periureteral stranding. Urinary bladder is unremarkable. GI AND BOWEL: Stomach and duodenal sweep demonstrate no acute abnormality. Normal appendix (image 134). Sigmoid diverticulosis, without evidence of diverticulitis. Mildly increased rectal stool burden with mild presacral stranding but no findings suspicious for stercoral colitis. There is no bowel obstruction. No abnormal bowel wall thickening or distension. REPRODUCTIVE: Uterus is unremarkable. PERITONEUM AND RETROPERITONEUM: No ascites or free air. LYMPH NODES: No lymphadenopathy. ABDOMINAL BONES AND SOFT TISSUES: Degenerative changes of the thoracolumbar  spine. No acute soft tissue abnormality. IMPRESSION: 1. No thoracoabdominal aortic aneurysm or dissection. No pulmonary embolism. 2. Atherosclerotic calcifications of the abdominopelvic vasculature, which remain patent. 3. Cardiomegaly. Very mild interstitial edema is possible. Small bilateral pleural effusions, partially loculated. 4. Status post aortic and mitral valve replacement. 5. Additional ancillary findings, as above. Electronically signed by: Pinkie Pebbles MD 10/21/2024 07:40 PM EST RP Workstation: [id number]   CT ANGIO CHEST AORTA W/CM & OR WO/CM Result Date: 10/21/2024 EXAM: CTA CHEST, ABDOMEN AND PELVIS WITH CONTRAST 10/21/2024 03:37:00 PM TECHNIQUE: CTA of the chest was performed with the administration of intravenous contrast. CTA of the abdomen and pelvis was performed with the administration of intravenous contrast. Multiplanar reformatted images are provided for review. MIP images are provided for review. Automated exposure control, iterative reconstruction, and/or weight based adjustment of the mA/kV was utilized to reduce the radiation dose to as low as reasonably achievable. COMPARISON: CT abdomen/pelvis dated 09/11/2024 and CTA chest dated  01/25/2024. CLINICAL HISTORY: Aortic valve replacement (TAVR), pre-op eval. FINDINGS: VASCULATURE: AORTA: No acute finding. No abdominal aortic aneurysm. No dissection. Mild thoracic aortic atherosclerosis. PULMONARY ARTERIES: Study not tailored for evaluation of the pulmonary arteries, but there is no pulmonary embolism to the lobar level. GREAT VESSELS OF AORTIC ARCH: No acute finding. No dissection. No arterial occlusion or significant stenosis. CELIAC TRUNK: Mild atherosclerotic calcifications at the origin of the celiac artery, patent. SUPERIOR MESENTERIC ARTERY: Mild atherosclerotic calcifications at the origin of the SMA, patent. INFERIOR MESENTERIC ARTERY: IMA is patent. RENAL ARTERIES: Mild atherosclerotic calcifications at the origin of the  bilateral renal arteries, patent. ILIAC ARTERIES: Atherosclerotic calcifications of the bilateral iliac arteries, patent. CHEST: MEDIASTINUM: Cardiomegaly. Status post aortic and mitral valve replacement. Small mediastinal nodes, including an 11 mm short axis right azygoesophageal recess node, likely reactive. LUNGS AND PLEURA: Lung apices excluded. Small bilateral pleural effusions, partially loculated. Left pleural calcification, chronic, suggesting prior pleurodesis. Faint perihilar ground glass opacity suggests very mild interstitial edema. 4 mm subpleural left lower lobe nodule (image 52), unchanged since at least 2023, benign. No follow-up is recommended. No pneumothorax. THORACIC BONES AND SOFT TISSUES: Median sternotomy. Radiation changes in the anterior right hemithorax. Status post right breast lumpectomy. Degenerative changes of the thoracolumbar spine. ABDOMEN AND PELVIS: LIVER: The liver is unremarkable. GALLBLADDER AND BILE DUCTS: Gallbladder is unremarkable. No biliary ductal dilatation. SPLEEN: The spleen is unremarkable. PANCREAS: The pancreas is unremarkable. ADRENAL GLANDS: Bilateral adrenal glands demonstrate no acute abnormality. KIDNEYS, URETERS AND BLADDER: Bilateral renal cortical scarring/atrophy. No stones in the kidneys or ureters. No hydronephrosis. No perinephric or periureteral stranding. Urinary bladder is unremarkable. GI AND BOWEL: Stomach and duodenal sweep demonstrate no acute abnormality. Normal appendix (image 134). Sigmoid diverticulosis, without evidence of diverticulitis. Mildly increased rectal stool burden with mild presacral stranding but no findings suspicious for stercoral colitis. There is no bowel obstruction. No abnormal bowel wall thickening or distension. REPRODUCTIVE: Uterus is unremarkable. PERITONEUM AND RETROPERITONEUM: No ascites or free air. LYMPH NODES: No lymphadenopathy. ABDOMINAL BONES AND SOFT TISSUES: Degenerative changes of the thoracolumbar spine. No  acute soft tissue abnormality. IMPRESSION: 1. No thoracoabdominal aortic aneurysm or dissection. No pulmonary embolism. 2. Atherosclerotic calcifications of the abdominopelvic vasculature, which remain patent. 3. Cardiomegaly. Very mild interstitial edema is possible. Small bilateral pleural effusions, partially loculated. 4. Status post aortic and mitral valve replacement. 5. Additional ancillary findings, as above. Electronically signed by: Pinkie Pebbles MD 10/21/2024 07:40 PM EST RP Workstation: [id number]            LOS: 14 days   Time spent= 35 mins    Deliliah Room, MD [facility]  If 7PM-7AM, please contact night-coverage  10/22/2024, 9:42 AM  "

## 2024-10-22 NOTE — Progress Notes (Signed)
 PHARMACY - ANTICOAGULATION CONSULT NOTE  Pharmacy Consult for heparin  Indication: atrial fibrillation  Allergies[1]  Patient Measurements: Height: 5' 2 (157.5 cm) Weight: 64.1 kg (141 lb 5 oz) IBW/kg (Calculated) : 50.1 HEPARIN  DW (KG): 64.6  Vital Signs: Temp: 98 F (36.7 C) (01/20 0750) Temp Source: Oral (01/20 0750) BP: 121/66 (01/20 0750) Pulse Rate: 73 (01/20 0750)  Labs: Recent Labs    10/20/24 0344 10/21/24 0430 10/21/24 1055 10/22/24 0321  HGB 11.3* 11.1*  --  10.3*  HCT 36.4 36.3  --  33.8*  PLT 87* 88*  --  80*  CREATININE 1.20*  --  1.25*  --     Estimated Creatinine Clearance: 31.6 mL/min (A) (by C-G formula based on SCr of 1.25 mg/dL (H)).   Medical History: Past Medical History:  Diagnosis Date   Anemia    Asthma 2010   Atrial fibrillation (HCC)    Atrial flutter (HCC)    Breast carcinoma (HCC) 1995   1995   Cardioembolic stroke (HCC) 01/2012   Right frontal in 01/2012; normal carotid ultrasound; possible LAA thrombus by TEE; virtual complete neurologic recovery   Carotid artery stenosis 1961   Chronic kidney disease, stage 2, mildly decreased GFR    GFR of approximately 60   Diabetes mellitus without complication (HCC) 5+ years ago   Controlled most of time   Diverticulosis of colon (without mention of hemorrhage) 2012   Dr. Golda   Fasting hyperglycemia    120 fasting   Gastroesophageal reflux disease    Gastroparesis    Heart failure with improved ejection fraction (HFimpEF) (HCC) 5+ years   Heart murmur 1961   Hemorrhoids    History of aortic valve replacement with bioprosthetic valve    History of mitral valve replacement with bioprosthetic valve    Hyperlipidemia    Hypertension    pt denies 05/30/13     Dr Alvan chester   Hyponatremia    Hypothyroidism    Rheumatic heart disease    Shortness of breath    Syncope and collapse February, 2024      Assessment: 31 yoF admitted with HFpEF. Pt on apixaban  PTA for AF, to  transition to IV heparin  in anticipation of L/RHC later this admit. Will dose via APTT initially.  Goal of Therapy:  Heparin  level 0.3-0.7 units/ml aPTT 66-102 seconds Monitor platelets by anticoagulation protocol: Yes   Plan:  Heparin  800 units/h Check aPTT in 8h Daily aPTT, heparin  level, CBC  Ozell Jamaica, PharmD, BCPS, [facility] Clinical Pharmacist [phone] Please check AMION for all [facility] Pharmacy numbers 10/22/2024       [1] No Known Allergies

## 2024-10-22 NOTE — Plan of Care (Signed)
   Problem: Education: Goal: Knowledge of General Education information will improve Description: Including pain rating scale, medication(s)/side effects and non-pharmacologic comfort measures Outcome: Progressing   Problem: Pain Managment: Goal: General experience of comfort will improve and/or be controlled Outcome: Progressing   Problem: Safety: Goal: Ability to remain free from injury will improve Outcome: Progressing

## 2024-10-22 NOTE — Consult Note (Addendum)
 "   Advanced Heart Failure Team Consult Note  Primary Physician: Sheryle Carwin, MD Cardiologist:  Alvan Carrier, MD HPI:    [name] is seen today for evaluation of pre-op optimization in the setting of severe RV failure and HFmrEF at the request of Dr. Wendel.   [name] is a 79 y.o. female with chronic diastolic heart failure, s/p prosthetic AVR and MVR in 2014, moderate COPD (per PFTs in 2014), paroxysmal atrial fibrillation, HTN, DM 2, HLD.  Patient had been doing relatively well playing golf regularly up until a fall in September, since then she has had multiple hospitalizations for UTI, CHF exacerbation, and COPD. Most recent admission 09/11/24. At that time echo showed EF 60-65%, nl RV function, no MR or AI. She was diuresed.   She presented again to ED 10/09/23 with weakness and acute hypoxic respiratory failure due to acute on chronic HFpEF. Cardiology was consulted and has been following. Echo showed EF 45%, severely reduced RV function, new mod to severe stenosis of both the AVR and MVR. Interventional Cardiology consulted. TAVR CT showed deterioration of both valves. Felt to be favorable for TMVR. Diuresis started with IV lasix , although with modest effect based on I/Os for this admission.   Today, her husband is at the bedside to assist with history. Patient reports that she has been feeling very weak and exhausted for a while. It has progressed to the point where she is recliner bound. Her husband assists her with mobility.  Home Medications Prior to Admission medications  Medication Sig Start Date End Date Taking? Authorizing Provider  acetaminophen  (TYLENOL ) 325 MG tablet Take 2 tablets (650 mg total) by mouth every 6 (six) hours as needed. 01/20/24  Yes [name], DO  albuterol  (VENTOLIN  HFA) 108 (90 Base) MCG/ACT inhaler Inhale 2 puffs into the lungs every 6 (six) hours as needed for wheezing or shortness of breath. 08/17/23  Yes [provider]   amiodarone  (PACERONE ) 200 MG tablet Take 1 tablet (200 mg total) by mouth daily. 09/23/24  Yes Mealor, Eulas BRAVO, MD  apixaban  (ELIQUIS ) 5 MG TABS tablet Take 1 tablet (5 mg total) by mouth 2 (two) times daily. 07/26/24  Yes Shahmehdi, Seyed A, MD  atorvastatin  (LIPITOR) 40 MG tablet Take 1 tablet (40 mg total) by mouth daily. 07/05/24  Yes BranchCarrier FALCON, MD  azelastine  (ASTELIN ) 0.1 % nasal spray Place 1 spray into both nostrils 2 (two) times daily. Use in each nostril as directed Patient taking differently: Place 1 spray into both nostrils daily as needed for allergies. Use in each nostril as directed 04/30/24  Yes [name], NP  escitalopram  (LEXAPRO ) 10 MG tablet Take 10 mg by mouth daily with breakfast. 04/10/15  Yes [provider]  feeding supplement (ENSURE PLUS HIGH PROTEIN) LIQD Take 237 mLs by mouth 2 (two) times daily between meals. 07/23/24  Yes Shahmehdi, [name], MD  levothyroxine  (SYNTHROID ) 100 MCG tablet Take 100 mcg by mouth daily before breakfast.   Yes [provider]  meclizine  (ANTIVERT ) 25 MG tablet Take 1 tablet (25 mg total) by mouth every 6 (six) hours as needed for dizziness. 08/15/24  Yes BranchCarrier FALCON, MD  metFORMIN  (GLUCOPHAGE ) 500 MG tablet Take 1 tablet (500 mg total) by mouth 2 (two) times daily with a meal. 08/03/13  Yes Collins, Gina L, PA-C  Multiple Vitamin (MULTIVITAMIN WITH MINERALS) TABS tablet Take 1 tablet by mouth in the morning.   Yes [provider]  ondansetron  (ZOFRAN -ODT) 4 MG disintegrating tablet Take 4 mg by mouth every 6 (six) hours as needed for nausea or vomiting. 07/18/24  Yes [provider]  pantoprazole  (PROTONIX ) 40 MG tablet TAKE 1 TABLET 30 TO 60 MINUTES BEFORE FIRST MEAL OF THE DAY. Patient taking differently: Take 40 mg by mouth daily before breakfast. 03/15/24  Yes Wert, Ozell NOVAK, MD  Polyethyl Glycol-Propyl Glycol (SYSTANE OP) Place 1 drop into both eyes daily as needed (dry eyes).    Yes [provider]  potassium chloride  SA (KLOR-CON  M) 20 MEQ tablet Take 1 tablet (20 mEq total) by mouth daily. Patient taking differently: Take 20 mEq by mouth in the morning. 11/14/22 10/02/25 Yes Rai, Ripudeep K, MD  torsemide  (DEMADEX ) 20 MG tablet Take 1 tablet (20 mg total) by mouth daily. 07/25/24 10/02/25 Yes Shahmehdi, [name], MD  TRELEGY ELLIPTA  100-62.5-25 MCG/ACT AEPB Inhale 1 puff into the lungs daily. 06/25/24  Yes [provider]  benzonatate  (TESSALON ) 100 MG capsule Take 1 capsule (100 mg total) by mouth 3 (three) times daily as needed for cough. Patient not taking: Reported on 10/14/2024 09/17/24   [name], DO    Past Medical History: Past Medical History:  Diagnosis Date   Anemia    Asthma 2010   Atrial fibrillation [facility])    Atrial flutter (HCC)    Breast carcinoma (HCC) 1995   1995   Cardioembolic stroke (HCC) 01/2012   Right frontal in 01/2012; normal carotid ultrasound; possible LAA thrombus by TEE; virtual complete neurologic recovery   Carotid artery stenosis 1961   Chronic kidney disease, stage 2, mildly decreased GFR    GFR of approximately 60   Diabetes mellitus without complication (HCC) 5+ years ago   Controlled most of time   Diverticulosis of colon (without mention of hemorrhage) 2012   Dr. Golda   Fasting hyperglycemia    120 fasting   Gastroesophageal reflux disease    Gastroparesis    Heart failure with improved ejection fraction (HFimpEF) (HCC) 5+ years   Heart murmur 1961   Hemorrhoids    History of aortic valve replacement with bioprosthetic valve    History of mitral valve replacement with bioprosthetic valve    Hyperlipidemia    Hypertension    pt denies 05/30/13     Dr Alvan chester   Hyponatremia    Hypothyroidism    Rheumatic heart disease    Shortness of breath    Syncope and collapse February, 2024    Past Surgical History: Past Surgical History:  Procedure Laterality Date   AORTIC VALVE  REPLACEMENT N/A 07/18/2013   Procedure: AORTIC VALVE REPLACEMENT (AVR);  Surgeon: Dorise MARLA Fellers, MD;  Location: [facility] OR;  Service: Open Heart Surgery;  Laterality: N/A;   BACK SURGERY     BREAST LUMPECTOMY Right 10/03/1993   CARDIAC CATHETERIZATION  5+ years   CARDIOVERSION N/A 07/26/2013   Procedure: CARDIOVERSION;  Surgeon: Vina LULLA Gull, MD;  Location: [facility] ENDOSCOPY;  Service: Cardiovascular;  Laterality: N/A;   CARDIOVERSION N/A 01/04/2023   Procedure: CARDIOVERSION;  Surgeon: Hobart Powell BRAVO, MD;  Location: [facility] ENDOSCOPY;  Service: Cardiovascular;  Laterality: N/A;   CARDIOVERSION N/A 02/23/2024   Procedure: CARDIOVERSION;  Surgeon: [name], MD;  Location: [facility] ORS;  Service: Endoscopy;  Laterality: N/A;   COLONOSCOPY  10/03/2010   Negative screening procedure   DILATION AND CURETTAGE OF UTERUS     ESOPHAGEAL MANOMETRY N/A 06/17/2013   Procedure: ESOPHAGEAL MANOMETRY (EM);  Surgeon: Alm JONELLE Gander, MD;  Location: [facility];  Service: Endoscopy;  Laterality: N/A;   INTRAOPERATIVE TRANSESOPHAGEAL ECHOCARDIOGRAM N/A 07/18/2013   Procedure: INTRAOPERATIVE TRANSESOPHAGEAL ECHOCARDIOGRAM;  Surgeon: Dorise MARLA Fellers, MD;  Location: [facility] OR;  Service: Open Heart Surgery;  Laterality: N/A;   LEFT AND RIGHT HEART CATHETERIZATION WITH CORONARY ANGIOGRAM N/A 07/10/2013   Procedure: LEFT AND RIGHT HEART CATHETERIZATION WITH CORONARY ANGIOGRAM;  Surgeon: Lonni JONETTA Cash, MD;  Location: [facility] CATH LAB;  Service: Cardiovascular;  Laterality: N/A;   LOOP RECORDER INSERTION N/A 07/02/2024   Procedure: LOOP RECORDER INSERTION;  Surgeon: Nancey Eulas BRAVO, MD;  Location: [facility] INVASIVE CV LAB;  Service: Cardiovascular;  Laterality: N/A;   LUMBAR LAMINECTOMY/DECOMPRESSION MICRODISCECTOMY Right 05/13/2014   Procedure: LUMBAR LAMINECTOMY/DECOMPRESSION MICRODISCECTOMY 1 LEVEL  lumbar four/five;  Surgeon: Darina MALVA Boehringer, MD;  Location: [facility] NEURO ORS;  Service: Neurosurgery;  Laterality: Right;    MITRAL VALVE REPLACEMENT N/A 07/18/2013   Procedure: MITRAL VALVE (MV) REPLACEMENT;  Surgeon: Dorise MARLA Fellers, MD;  Location: [facility] OR;  Service: Open Heart Surgery;  Laterality: N/A;   MITRAL VALVE SURGERY  10/03/1960   [facility], closed mitral valvulotomy by finger fracture   TEE WITHOUT CARDIOVERSION  01/24/2012   Procedure: TRANSESOPHAGEAL ECHOCARDIOGRAM (TEE);  Surgeon: Redell GORMAN Shallow, MD;  Location: [facility] ENDOSCOPY;  Service: Cardiovascular;  Laterality: N/A;   TEE WITHOUT CARDIOVERSION N/A 07/11/2013   Procedure: TRANSESOPHAGEAL ECHOCARDIOGRAM (TEE);  Surgeon: Aleene JINNY Passe, MD;  Location: [facility] ENDOSCOPY;  Service: Cardiovascular;  Laterality: N/A;   TEE WITHOUT CARDIOVERSION N/A 07/26/2013   Procedure: TRANSESOPHAGEAL ECHOCARDIOGRAM (TEE);  Surgeon: Vina LULLA Gull, MD;  Location: [facility] ENDOSCOPY;  Service: Cardiovascular;  Laterality: N/A;   TUBAL LIGATION  10/04/1971    Family History: Family History  Problem Relation Age of Onset   Heart disease Brother 47       MI   Rheum arthritis Maternal Grandmother    Asthma Maternal Grandfather    Hypothyroidism Mother    Diabetes Sister    Cirrhosis Father     Social History: Social History   Socioeconomic History   Marital status: Married    Spouse name: Not on file   Number of children: 1   Years of education: Not on file   Highest education level: Not on file  Occupational History   Occupation: Retired Magazine Features Editor: RETIRED  Tobacco Use   Smoking status: Never   Smokeless tobacco: Never  Vaping Use   Vaping status: Never Used  Substance and Sexual Activity   Alcohol use: No    Alcohol/week: 0.0 standard drinks of alcohol   Drug use: No   Sexual activity: Not Currently  Other Topics Concern   Not on file  Social History Narrative   Married with children. No regular exercise   Social Drivers of Health   Tobacco Use: Low Risk (10/08/2024)   Patient History    Smoking Tobacco Use: Never    Smokeless Tobacco Use: Never     Passive Exposure: Not on file  Financial Resource Strain: Not on file  Food Insecurity: No Food Insecurity (10/09/2024)   Epic    Worried About Programme Researcher, Broadcasting/film/video in the Last Year: Never true    Ran Out of Food in the Last Year: Never true  Transportation Needs: No Transportation Needs (10/09/2024)   Epic    Lack of Transportation (Medical): No    Lack of Transportation (Non-Medical): No  Physical Activity: Not on file  Stress: Not  on file  Social Connections: Moderately Isolated (10/09/2024)   Social Connection and Isolation Panel    Frequency of Communication with Friends and Family: Once a week    Frequency of Social Gatherings with Friends and Family: Once a week    Attends Religious Services: 1 to 4 times per year    Active Member of [employer] or Organizations: No    Attends Banker Meetings: Never    Marital Status: Married  Depression (PHQ2-9): Not on file  Alcohol Screen: Not on file  Housing: Low Risk (10/09/2024)   Epic    Unable to Pay for Housing in the Last Year: No    Number of Times Moved in the Last Year: 0    Homeless in the Last Year: No  Utilities: Not At Risk (10/09/2024)   Epic    Threatened with loss of utilities: No  Health Literacy: Not on file    Allergies:  Allergies[1]  Objective:   Vital Signs:   Temp:  [98 F (36.7 C)-98.7 F (37.1 C)] 98 F (36.7 C) (01/20 0750) Pulse Rate:  [71-77] 73 (01/20 0750) Resp:  [18-19] 19 (01/20 0750) BP: (114-130)/(56-66) 121/66 (01/20 0750) SpO2:  [96 %-100 %] 100 % (01/20 0750) Weight:  [64.1 kg] 64.1 kg (01/20 0500) Last BM Date : 10/21/24  Weight change: Filed Weights   10/20/24 0500 10/21/24 0500 10/22/24 0500  Weight: 66 kg 68.5 kg 64.1 kg   Intake/Output:  Intake/Output Summary (Last 24 hours) at 10/22/2024 1139 Last data filed at 10/22/2024 1120 Gross per 24 hour  Intake 520 ml  Output 1450 ml  Net -930 ml    Physical Exam   General:  ashen appearing.   Cor: Regular rate & rhythm. No  murmurs. JVD 8cm.  Lungs: clear Extremities: 1+ BLE  Telemetry   SR 70s (personally reviewed)  Labs   Basic Metabolic Panel: Recent Labs  Lab 10/16/24 0517 10/17/24 0406 10/18/24 0527 10/19/24 0439 10/20/24 0344 10/21/24 1055 10/22/24 0924  NA 136 136 135 131* 137 131* 137  K 3.7 3.0* 3.6 5.2* 4.7 5.9* 4.4  CL 94* 94* 94* 94* 99 98 100  CO2 29 28 30 27 28 22 25   GLUCOSE 148* 106* 92 119* 110* 182* 112*  BUN 18 14 14 19 19 18 15   CREATININE 1.15* 1.04* 1.04* 1.15* 1.20* 1.25* 1.25*  CALCIUM  8.5* 8.3* 8.3* 8.7* 9.0 9.2 9.1  MG 1.7 1.7 2.1 1.7  --   --   --    Liver Function Tests: Recent Labs  Lab 10/16/24 0517  AST 35  ALT 43  ALKPHOS 73  BILITOT 1.0  PROT 6.6  ALBUMIN  3.5   No results for input(s): LIPASE, AMYLASE in the last 168 hours. No results for input(s): AMMONIA in the last 168 hours.  CBC: Recent Labs  Lab 10/18/24 0527 10/19/24 0439 10/20/24 0344 10/21/24 0430 10/22/24 0321  WBC 7.8 8.3 8.1 9.4 7.7  HGB 10.0* 10.8* 11.3* 11.1* 10.3*  HCT 33.3* 35.0* 36.4 36.3 33.8*  MCV 86.0 85.4 85.6 86.4 87.3  PLT 104* 93* 87* 88* 80*   Cardiac Enzymes: No results for input(s): CKTOTAL, CKMB, CKMBINDEX, TROPONINI in the last 168 hours.  BNP (last 3 results) Recent Labs    01/25/24 2031 03/14/24 1016 05/30/24 0727  BNP 775.0* 503.0* 528.0*   ProBNP (last 3 results) Recent Labs    07/24/24 0438 09/11/24 1824 10/08/24 2147  PROBNP 2,155.0* 5,666.0* 5,574.0*   CBG: Recent Labs  Lab  10/21/24 1144 10/21/24 1643 10/21/24 2130 10/22/24 0731 10/22/24 1122  GLUCAP 178* 125* 191* 105* 220*   Coagulation Studies: No results for input(s): LABPROT, INR in the last 72 hours.  Imaging   CT CORONARY MORPH W/CTA COR W/SCORE W/CA W/CM &/OR WO/CM Addendum Date: 10/21/2024 ADDENDUM REPORT: 10/21/2024 21:18 EXAM: OVER-READ INTERPRETATION  CT CHEST The following report is an over-read performed by radiologist Dr. Vanetta Mortimer  [facility] on 10/21/2024. This over-read does not include interpretation of cardiac or coronary anatomy or pathology. The coronary CTA interpretation by the cardiologist is attached. COMPARISON:  CT dated 10/21/2024. FINDINGS: Mild atherosclerotic calcification of the visualized aorta. Top-normal subcarinal lymph node measures 10 mm short axis. The visualized esophagus is grossly unremarkable. No mediastinal fluid collection. Partially visualized small right and trace left pleural effusions. Faint diffuse hazy density throughout the lungs most consistent with edema. Pneumonia is not excluded. No acute osseous pathology. IMPRESSION: 1. Partially visualized small right and trace left pleural effusions. 2. Faint diffuse hazy density throughout the lungs most consistent with edema. Pneumonia is not excluded. 3.  Aortic Atherosclerosis (ICD10-I70.0). Electronically Signed   By: Vanetta Chou M.D.   On: 10/21/2024 21:18   Result Date: 10/21/2024 CLINICAL DATA:  Severe bioprosthetic aortic valve stenosis. Severe bioprosthetic mitral valve stenosis. AVR with 21 mm Magna Ease 07/19/2013 and 25 mm Magna Ease at same time. EXAM: Cardiac TAVR CT TECHNIQUE: A non-contrast, gated CT scan was obtained with axial slices of 2.5 mm through the heart for aortic valve scoring. A 120 kV retrospective, gated, contrast cardiac scan was obtained. Gantry rotation speed was 250 msec and collimation was 0.6 mm. Nitroglycerin  was not given. A delayed scan was obtained to exclude left atrial appendage thrombus. The 3D dataset was reconstructed in systole with motion correction. The 3D data set was reconstructed in 5% intervals of the 0-95% of the R-R cycle. Systolic and diastolic phases were analyzed on a dedicated workstation using MPR, MIP, and VRT modes. The patient received 100 cc of contrast. FINDINGS: Image quality: Excellent. Noise artifact is: Limited. Mitral prosthesis: A 25 mm Magna ease prosthesis is present in the  mitral position. The leaflets and calcified and thickened. Two of the leaflets are fixed and immobile consistent with structural deterioration. No evidence of valve thrombosis. The valve area is 0.79 cm2 by direct planimetry. ViV analysis: A valve in valve analysis was performed using a 26 mm S3. The neo-LVOT is 254 mm2, indicating low-risk for outflow traction obstruction. The aorto-mitral angle is 127 degrees which is favorable. Optimal coplanar projection: RAO 23 CRA 1 Aortic prosthesis: A 21 mm Magna ease prosthesis is present in the aortic position. The leaflets are calcified and thickened with restricted leaflet motion in systole. Findings consistent with structural deterioration. ViV analysis: A valve in valve analysis was performed using a 23 mm virtual transcatheter heart valve. The virtual valve to coronary (VTC) distance for the left main is 3.5 mm indicating high-risk for coronary obstruction. The RCA takes off above the virtual valve indicating low-risk for coronary obstruction. Optimal coplanar projection: LAO 11 CAU 2 Aorta: Sinus of Valsalva Measurements: Non-coronary: 27 mm Right-coronary: 27 mm Left-coronary: 27 mm Sinotubular Junction: 26 mm Ascending Thoracic Aorta: 30 mm Coronary Arteries: Normal coronary origin. Right dominance. This study did not use nitroglycerin  and stenosis severity could not be determined. Right Atrium: Right atrial size is severely dilated. Contrast reflux into the IVC consistent with elevated RA pressure. Right Ventricle: The right ventricular cavity is severely  dilated. The septum is flattened in systole/diastole concerning for volume/pressure overload. Left Atrium: Left atrial size is severely dilated. The LAA appears to be absent or surgically ligated, no filling defect. Left Ventricle: The ventricular cavity size is within normal limits. Pulmonary arteries: Dilated pulmonary artery suggestive of pulmonary hypertension. Pulmonary veins: Normal pulmonary venous  drainage. Pericardium: Normal thickness with no significant effusion or calcium  present. Extra-cardiac findings: See attached radiology report for non-cardiac structures. IMPRESSION: 1. Bioprosthetic aortic and mitral prostheses with evidence of structural deterioration as detailed above. 2. ViV analysis favorable for TMVR with low risk of outflow tract obstruction. 3. ViV analysis indicates high risk for coronary obstruction for TAVR. Left main VTC 3.5 mm. 4. Severely dilated right ventricle and right atrium. Contrast reflux into the IVC consistent with elevated RA pressure. 5. Dilated pulmonary artery suggestive of pulmonary hypertension. 6. The septum is flattened in systole/diastole concerning for volume/pressure overload. Darryle T. Barbaraann, MD Electronically Signed: By: Darryle Barbaraann M.D. On: 10/21/2024 21:05   CT Angio Abd/Pel w/ and/or w/o Result Date: 10/21/2024 EXAM: CTA CHEST, ABDOMEN AND PELVIS WITH CONTRAST 10/21/2024 03:37:00 PM TECHNIQUE: CTA of the chest was performed with the administration of intravenous contrast. CTA of the abdomen and pelvis was performed with the administration of intravenous contrast. Multiplanar reformatted images are provided for review. MIP images are provided for review. Automated exposure control, iterative reconstruction, and/or weight based adjustment of the mA/kV was utilized to reduce the radiation dose to as low as reasonably achievable. COMPARISON: CT abdomen/pelvis dated 09/11/2024 and CTA chest dated 01/25/2024. CLINICAL HISTORY: Aortic valve replacement (TAVR), pre-op eval. FINDINGS: VASCULATURE: AORTA: No acute finding. No abdominal aortic aneurysm. No dissection. Mild thoracic aortic atherosclerosis. PULMONARY ARTERIES: Study not tailored for evaluation of the pulmonary arteries, but there is no pulmonary embolism to the lobar level. GREAT VESSELS OF AORTIC ARCH: No acute finding. No dissection. No arterial occlusion or significant stenosis. CELIAC TRUNK: Mild  atherosclerotic calcifications at the origin of the celiac artery, patent. SUPERIOR MESENTERIC ARTERY: Mild atherosclerotic calcifications at the origin of the SMA, patent. INFERIOR MESENTERIC ARTERY: IMA is patent. RENAL ARTERIES: Mild atherosclerotic calcifications at the origin of the bilateral renal arteries, patent. ILIAC ARTERIES: Atherosclerotic calcifications of the bilateral iliac arteries, patent. CHEST: MEDIASTINUM: Cardiomegaly. Status post aortic and mitral valve replacement. Small mediastinal nodes, including an 11 mm short axis right azygoesophageal recess node, likely reactive. LUNGS AND PLEURA: Lung apices excluded. Small bilateral pleural effusions, partially loculated. Left pleural calcification, chronic, suggesting prior pleurodesis. Faint perihilar ground glass opacity suggests very mild interstitial edema. 4 mm subpleural left lower lobe nodule (image 52), unchanged since at least 2023, benign. No follow-up is recommended. No pneumothorax. THORACIC BONES AND SOFT TISSUES: Median sternotomy. Radiation changes in the anterior right hemithorax. Status post right breast lumpectomy. Degenerative changes of the thoracolumbar spine. ABDOMEN AND PELVIS: LIVER: The liver is unremarkable. GALLBLADDER AND BILE DUCTS: Gallbladder is unremarkable. No biliary ductal dilatation. SPLEEN: The spleen is unremarkable. PANCREAS: The pancreas is unremarkable. ADRENAL GLANDS: Bilateral adrenal glands demonstrate no acute abnormality. KIDNEYS, URETERS AND BLADDER: Bilateral renal cortical scarring/atrophy. No stones in the kidneys or ureters. No hydronephrosis. No perinephric or periureteral stranding. Urinary bladder is unremarkable. GI AND BOWEL: Stomach and duodenal sweep demonstrate no acute abnormality. Normal appendix (image 134). Sigmoid diverticulosis, without evidence of diverticulitis. Mildly increased rectal stool burden with mild presacral stranding but no findings suspicious for stercoral colitis. There  is no bowel obstruction. No abnormal bowel wall thickening or distension.  REPRODUCTIVE: Uterus is unremarkable. PERITONEUM AND RETROPERITONEUM: No ascites or free air. LYMPH NODES: No lymphadenopathy. ABDOMINAL BONES AND SOFT TISSUES: Degenerative changes of the thoracolumbar spine. No acute soft tissue abnormality. IMPRESSION: 1. No thoracoabdominal aortic aneurysm or dissection. No pulmonary embolism. 2. Atherosclerotic calcifications of the abdominopelvic vasculature, which remain patent. 3. Cardiomegaly. Very mild interstitial edema is possible. Small bilateral pleural effusions, partially loculated. 4. Status post aortic and mitral valve replacement. 5. Additional ancillary findings, as above. Electronically signed by: Pinkie Pebbles MD 10/21/2024 07:40 PM EST RP Workstation: [id number]   CT ANGIO CHEST AORTA W/CM & OR WO/CM Result Date: 10/21/2024 EXAM: CTA CHEST, ABDOMEN AND PELVIS WITH CONTRAST 10/21/2024 03:37:00 PM TECHNIQUE: CTA of the chest was performed with the administration of intravenous contrast. CTA of the abdomen and pelvis was performed with the administration of intravenous contrast. Multiplanar reformatted images are provided for review. MIP images are provided for review. Automated exposure control, iterative reconstruction, and/or weight based adjustment of the mA/kV was utilized to reduce the radiation dose to as low as reasonably achievable. COMPARISON: CT abdomen/pelvis dated 09/11/2024 and CTA chest dated 01/25/2024. CLINICAL HISTORY: Aortic valve replacement (TAVR), pre-op eval. FINDINGS: VASCULATURE: AORTA: No acute finding. No abdominal aortic aneurysm. No dissection. Mild thoracic aortic atherosclerosis. PULMONARY ARTERIES: Study not tailored for evaluation of the pulmonary arteries, but there is no pulmonary embolism to the lobar level. GREAT VESSELS OF AORTIC ARCH: No acute finding. No dissection. No arterial occlusion or significant stenosis. CELIAC TRUNK: Mild  atherosclerotic calcifications at the origin of the celiac artery, patent. SUPERIOR MESENTERIC ARTERY: Mild atherosclerotic calcifications at the origin of the SMA, patent. INFERIOR MESENTERIC ARTERY: IMA is patent. RENAL ARTERIES: Mild atherosclerotic calcifications at the origin of the bilateral renal arteries, patent. ILIAC ARTERIES: Atherosclerotic calcifications of the bilateral iliac arteries, patent. CHEST: MEDIASTINUM: Cardiomegaly. Status post aortic and mitral valve replacement. Small mediastinal nodes, including an 11 mm short axis right azygoesophageal recess node, likely reactive. LUNGS AND PLEURA: Lung apices excluded. Small bilateral pleural effusions, partially loculated. Left pleural calcification, chronic, suggesting prior pleurodesis. Faint perihilar ground glass opacity suggests very mild interstitial edema. 4 mm subpleural left lower lobe nodule (image 52), unchanged since at least 2023, benign. No follow-up is recommended. No pneumothorax. THORACIC BONES AND SOFT TISSUES: Median sternotomy. Radiation changes in the anterior right hemithorax. Status post right breast lumpectomy. Degenerative changes of the thoracolumbar spine. ABDOMEN AND PELVIS: LIVER: The liver is unremarkable. GALLBLADDER AND BILE DUCTS: Gallbladder is unremarkable. No biliary ductal dilatation. SPLEEN: The spleen is unremarkable. PANCREAS: The pancreas is unremarkable. ADRENAL GLANDS: Bilateral adrenal glands demonstrate no acute abnormality. KIDNEYS, URETERS AND BLADDER: Bilateral renal cortical scarring/atrophy. No stones in the kidneys or ureters. No hydronephrosis. No perinephric or periureteral stranding. Urinary bladder is unremarkable. GI AND BOWEL: Stomach and duodenal sweep demonstrate no acute abnormality. Normal appendix (image 134). Sigmoid diverticulosis, without evidence of diverticulitis. Mildly increased rectal stool burden with mild presacral stranding but no findings suspicious for stercoral colitis. There  is no bowel obstruction. No abnormal bowel wall thickening or distension. REPRODUCTIVE: Uterus is unremarkable. PERITONEUM AND RETROPERITONEUM: No ascites or free air. LYMPH NODES: No lymphadenopathy. ABDOMINAL BONES AND SOFT TISSUES: Degenerative changes of the thoracolumbar spine. No acute soft tissue abnormality. IMPRESSION: 1. No thoracoabdominal aortic aneurysm or dissection. No pulmonary embolism. 2. Atherosclerotic calcifications of the abdominopelvic vasculature, which remain patent. 3. Cardiomegaly. Very mild interstitial edema is possible. Small bilateral pleural effusions, partially loculated. 4. Status post aortic and mitral valve replacement.  5. Additional ancillary findings, as above. Electronically signed by: Pinkie Pebbles MD 10/21/2024 07:40 PM EST RP Workstation: [id number]    Medications:    Current Medications:  amiodarone   200 mg Oral Daily   atorvastatin   40 mg Oral Daily   budesonide -glycopyrrolate -formoterol   2 puff Inhalation BID   Chlorhexidine  Gluconate Cloth  6 each Topical Daily   feeding supplement  237 mL Oral BID BM   furosemide   40 mg Intravenous BID   Gerhardt's butt cream  1 Application Topical TID   HYDROcodone  bit-homatropine  5 mL Oral QHS   insulin  aspart  0-5 Units Subcutaneous QHS   insulin  aspart  0-9 Units Subcutaneous TID WC   levothyroxine   100 mcg Oral QAC breakfast   lidocaine  HCl (PF)  10 mL Intradermal Once   pantoprazole   40 mg Oral QAC breakfast   sodium chloride  flush  10-40 mL Intracatheter Q12H    Infusions:  heparin  800 Units/hr (10/22/24 1015)   Patient Profile   [name] is a 79 y.o. female with chronic diastolic heart failure, s/p prosthetic AVR and MVR in 2014, moderate COPD (per PFTs in 2014), paroxysmal atrial fibrillation, HTN, DM 2, HLD.  Assessment/Plan   Acute on Chronic HFpEF, RV Failure - EF 60-65% with normal RV in 10/25 - Echo 1/13 showed EF 45%, severe RV, and new deterioration of valves (compared to echo  10/25) - Undergoing diuresis with IV Lasix , UOP has picked up. Remains volume up - Increase IV lasix  to 40 mg bid - no SGLT2i with frequent UTIs - no spiro with hyperkalemia  Rheumatic Heart Disease - s/p bioprosthetic AVR and MVR in 2014 - echo with mod to severe stenosis of AVR (mG 21.3 mmHg) and MVR (mG 10 mmHg) - TAVR CT showed deterioration of both valves - IC feels favorable for TMVR. ViV analysis indicates high risk for coronary obstruction for TAVR.  - On discussion with patient and husband, about risks and benefits as well as her overall deconditioning, the patient expressed that she is exhausted and tired and is not sure she has much fight left in her. Her husband and her would like to talk further before making a decision to move forward with interventions. If she does not want to proceed recommend hospice.  - If she does want procedure with have to undergo R/LHC first - Consult to palliative care to assist with discussion  Atrial Fibrillation: Paroxysmal - in NSR on tele - continue amio 200 mg daily - on heparin   CKD 3b - baseline Cr 1.1-1.3  Hyperkalemia - K up to 5.9, treated with lokelma  - improved today  Deconditioning - continue working aggressively with PT/OT  Length of Stay: 14  Jordan Lee, NP  10/22/2024, 11:39 AM  Advanced Heart Failure Team Pager [phone] (M-F; 7a - 5p)   Please visit [email or web address]: For overnight coverage please call cardiology fellow first. If fellow not available call Shock/ECMO MD on call.  For ECMO / Mechanical Support (Impella, IABP, LVAD) issues call Shock / ECMO MD on call.    Patient seen and examined with the above-signed Advanced Practice Provider and/or Housestaff. I personally reviewed laboratory data, imaging studies and relevant notes. I independently examined the patient and formulated the important aspects of the plan. I have edited the note to reflect any of my changes or salient points. I have personally discussed the plan  with the patient and/or family.  79 y/o woman with h/o RHD s/p bioprosthetic AVR/MVR in 2014  Since October  had been increasingly debilitated with SOB and weakness. Now nearly bedbound and receives close care from her husband.  W/u has revealed severe prosthetic MS and mod-severe prosthetic AS with normal LV function and severe RV dysfunction.   Cardiac CT shows that she would be a candidate for TMVR. Not candidate for TAVR with risk of obstruction.   Current she states that her major limitation is due to her balance and weakness and not her dyspnea  General:  Weak appearing elderly woman sitting up in bed. No resp difficulty HEENT: normal Neck: supple. JVP jaw Cor: Regular rate & rhythm. 2/6 AS s2 present 2/6 MS rumble Lungs: clear Abdomen: soft, nontender, nondistended.Good bowel sounds. Extremities: no cyanosis, clubbing, rash, edema Neuro: alert & orientedx3, cranial nerves grossly intact. moves all 4 extremities w/o difficulty. Affect pleasant  In reviewing her studies she would be a candidate for TMVR but risk would be elevated given age, fraility and RV dysfunction.   That said, given her overall debility I am unsure that fixing her valve currently would improve her functional capacity or QoL much (I gave them the analogy of replacing a car engine while there were still 2 flat tires).   In talking with them her husband appears more optimistic than she does about possible benefit. She said repeatedly during our conversation thht she didn't feel she had the reserve to improve.  We discussed option of Palliative Care as well and they are open to a Palliative Consult which we will order.   Would continue PT/OT for now. If patient decides she would like to proceed and PT/OT feel she is rehab-able then would proceed with R/L cath with plan to try to optimize her for a full TMVR evaluation.   Will increase lasix  for now. May be worthwhile placing PICC to help get an idea regarding need  for inotropes.   We will follow  D/w Dr. Wendel.   Toribio Fuel, MD  3:07 PM        [1] No Known Allergies  "

## 2024-10-22 NOTE — Progress Notes (Signed)
 PHARMACY - ANTICOAGULATION CONSULT NOTE  Pharmacy Consult for heparin  Indication: atrial fibrillation  Allergies[1]  Patient Measurements: Height: 5' 2 (157.5 cm) Weight: 64.1 kg (141 lb 5 oz) IBW/kg (Calculated) : 50.1 HEPARIN  DW (KG): 64.6  Vital Signs: Temp: 98.9 F (37.2 C) (01/20 2106) Temp Source: Oral (01/20 2106) BP: 126/62 (01/20 2106) Pulse Rate: 79 (01/20 2106)  Labs: Recent Labs    10/20/24 0344 10/21/24 0430 10/21/24 1055 10/22/24 0321 10/22/24 0924 10/22/24 2104  HGB 11.3* 11.1*  --  10.3*  --   --   HCT 36.4 36.3  --  33.8*  --   --   PLT 87* 88*  --  80*  --   --   APTT  --   --   --   --   --  57*  CREATININE 1.20*  --  1.25*  --  1.25*  --     Estimated Creatinine Clearance: 31.6 mL/min (A) (by C-G formula based on SCr of 1.25 mg/dL (H)).   Assessment: 59 yoF admitted with HFpEF. Pt on apixaban  PTA for AF, to transition to IV heparin  in anticipation of L/RHC later this admit.  aPTT = 57 seconds  Goal of Therapy:  Heparin  level 0.3-0.7 units/ml aPTT 66-102 seconds Monitor platelets by anticoagulation protocol: Yes   Plan:  Heparin  to 950 units / hr Daily aPTT, heparin  level, CBC  Thank you. Olam Monte, PharmD Please check AMION for all [facility] Pharmacy numbers 10/22/2024        [1] No Known Allergies

## 2024-10-23 DIAGNOSIS — E1169 Type 2 diabetes mellitus with other specified complication: Secondary | ICD-10-CM | POA: Diagnosis not present

## 2024-10-23 DIAGNOSIS — J45991 Cough variant asthma: Secondary | ICD-10-CM | POA: Diagnosis not present

## 2024-10-23 DIAGNOSIS — K219 Gastro-esophageal reflux disease without esophagitis: Secondary | ICD-10-CM | POA: Diagnosis not present

## 2024-10-23 DIAGNOSIS — E785 Hyperlipidemia, unspecified: Secondary | ICD-10-CM

## 2024-10-23 DIAGNOSIS — E039 Hypothyroidism, unspecified: Secondary | ICD-10-CM | POA: Diagnosis not present

## 2024-10-23 DIAGNOSIS — I110 Hypertensive heart disease with heart failure: Secondary | ICD-10-CM

## 2024-10-23 DIAGNOSIS — Z66 Do not resuscitate: Secondary | ICD-10-CM | POA: Diagnosis not present

## 2024-10-23 DIAGNOSIS — Z515 Encounter for palliative care: Secondary | ICD-10-CM | POA: Diagnosis not present

## 2024-10-23 DIAGNOSIS — I10 Essential (primary) hypertension: Secondary | ICD-10-CM

## 2024-10-23 DIAGNOSIS — I480 Paroxysmal atrial fibrillation: Secondary | ICD-10-CM | POA: Diagnosis not present

## 2024-10-23 DIAGNOSIS — Z7189 Other specified counseling: Secondary | ICD-10-CM | POA: Diagnosis not present

## 2024-10-23 DIAGNOSIS — I5033 Acute on chronic diastolic (congestive) heart failure: Secondary | ICD-10-CM | POA: Diagnosis not present

## 2024-10-23 LAB — BASIC METABOLIC PANEL WITH GFR
Anion gap: 12 (ref 5–15)
BUN: 12 mg/dL (ref 8–23)
CO2: 27 mmol/L (ref 22–32)
Calcium: 8.6 mg/dL — ABNORMAL LOW (ref 8.9–10.3)
Chloride: 97 mmol/L — ABNORMAL LOW (ref 98–111)
Creatinine, Ser: 1.26 mg/dL — ABNORMAL HIGH (ref 0.44–1.00)
GFR, Estimated: 43 mL/min — ABNORMAL LOW
Glucose, Bld: 92 mg/dL (ref 70–99)
Potassium: 3.5 mmol/L (ref 3.5–5.1)
Sodium: 136 mmol/L (ref 135–145)

## 2024-10-23 LAB — CBC
HCT: 32.8 % — ABNORMAL LOW (ref 36.0–46.0)
Hemoglobin: 10.2 g/dL — ABNORMAL LOW (ref 12.0–15.0)
MCH: 26.5 pg (ref 26.0–34.0)
MCHC: 31.1 g/dL (ref 30.0–36.0)
MCV: 85.2 fL (ref 80.0–100.0)
Platelets: 102 K/uL — ABNORMAL LOW (ref 150–400)
RBC: 3.85 MIL/uL — ABNORMAL LOW (ref 3.87–5.11)
RDW: 17.5 % — ABNORMAL HIGH (ref 11.5–15.5)
WBC: 9.1 K/uL (ref 4.0–10.5)
nRBC: 0 % (ref 0.0–0.2)

## 2024-10-23 LAB — APTT
aPTT: 123 s — ABNORMAL HIGH (ref 24–36)
aPTT: 101 s — ABNORMAL HIGH (ref 24–36)
aPTT: 105 s — ABNORMAL HIGH (ref 24–36)

## 2024-10-23 LAB — HEPARIN LEVEL (UNFRACTIONATED): Heparin Unfractionated: >1.1 [IU]/mL — ABNORMAL HIGH (ref 0.30–0.70)

## 2024-10-23 LAB — GLUCOSE, CAPILLARY
Glucose-Capillary: 109 mg/dL — ABNORMAL HIGH (ref 70–99)
Glucose-Capillary: 182 mg/dL — ABNORMAL HIGH (ref 70–99)
Glucose-Capillary: 123 mg/dL — ABNORMAL HIGH (ref 70–99)
Glucose-Capillary: 183 mg/dL — ABNORMAL HIGH (ref 70–99)

## 2024-10-23 MED ORDER — SODIUM CHLORIDE 0.9% FLUSH
3.0000 mL | Freq: Two times a day (BID) | INTRAVENOUS | Status: DC
  Administered 2024-10-23: 3 mL via INTRAVENOUS

## 2024-10-23 MED ORDER — SODIUM CHLORIDE 0.9% FLUSH: 3.0000 mL | INTRAVENOUS | Status: DC | PRN

## 2024-10-23 MED ORDER — DIPHENHYDRAMINE HCL 25 MG PO CAPS
25.0000 mg | ORAL_CAPSULE | Freq: Four times a day (QID) | ORAL | Status: DC | PRN
  Administered 2024-10-23 – 2024-10-24 (×3): 25 mg via ORAL
  Filled 2024-10-23 (×3): qty 1

## 2024-10-23 MED ORDER — POTASSIUM CHLORIDE CRYS ER 20 MEQ PO TBCR
40.0000 meq | EXTENDED_RELEASE_TABLET | Freq: Once | ORAL | Status: AC
  Administered 2024-10-23: 40 meq via ORAL
  Filled 2024-10-23: qty 2

## 2024-10-23 MED ORDER — SODIUM CHLORIDE 0.9 % IV SOLN: 250.0000 mL | INTRAVENOUS | Status: DC | PRN

## 2024-10-23 MED ORDER — DIPHENHYDRAMINE HCL 25 MG PO CAPS
25.0000 mg | ORAL_CAPSULE | Freq: Once | ORAL | Status: AC
  Administered 2024-10-23: 25 mg via ORAL
  Filled 2024-10-23: qty 1

## 2024-10-23 NOTE — Progress Notes (Signed)
 PT Cancellation Note  Patient Details Name: [name] MRN: [id number] DOB: 01/18/1946   Cancelled Treatment:    Reason Eval/Treat Not Completed: Patient still meeting with Palliative Medicine. Will follow up for PT treatment as schedule permits.  Andi Layfield, PT, DPT Acute Rehabilitation Services  Personal: [facility] Office: [phone]  Reita Shindler L Sheilah Rayos 10/23/2024, 1:45 PM

## 2024-10-23 NOTE — Assessment & Plan Note (Addendum)
 Patient was placed on insulin  sliding scale for glucose cover and monitoring during her hospitalization  Glucose remained well controlled.  At the time of her discharge will resume metformin   Continue with statin therapy

## 2024-10-23 NOTE — Progress Notes (Signed)
 " Progress Note   Patient: [name] FMW:[id number] DOB: 1946-06-17 DOA: 10/08/2024     15 DOS: the patient was seen and examined on 10/23/2024   Brief hospital course: 79 y.o. female with medical history significant for paroxysmal A-fib on Eliquis , chronic HFpEF, prior CVA, carotid artery stenosis, valvular heart disease, CKD 3B, hypertension, hyperlipidemia, type 2 diabetes, hypothyroidism, COPD, bioprosthetic aortic and mitral valve 07/18/2013 presenting with shortness of breath that began on the evening of 10/09/2023.  She has noted some lower extremity edema.  She denies any chest pain, fever, chills, nausea, vomiting, diarrhea.  She has a nonproductive cough. In the ED, the patient was afebrile and hemodynamically stable with oxygen  saturation 87% room air.  She was placed on 2 L with saturation up to 96%. WBC 5.7, hemoglobin 10.7, platelets 143.  Sodium 133, potassium 3.8, bicarbonate 30, serum creatinine 1.55.  Chest x-ray showed hazy right lower lobe opacity.  There was mild vascular congestion.  The patient was started on IV furosemide .   Notably, the patient had a hospital admission from 09/11/2024 to 09/17/2024 during which time she was treated for COPD exacerbation and acute on chronic HFpEF.  She was discharged home with torsemide  20 mg daily.  IV furosemide  doses were gradually escalated as the patient remains fluid overloaded.  The patient gradually improved clinically.  However she remains deconditioned.  The patient continued to be sob although improving.  Repeat CXR on 1/13 showed Stable mild pulmonary edema and chronic bilateral interstitial opacities and increase in right small pleural effusion.  Lasix  dosing was increased, and thoracocentesis was requested  Assessment and Plan: * Acute on chronic diastolic (congestive) heart failure (HCC) Echocardiogram with preserved LV systolic function with EF 50 to 55%, interventricular septum is flattened in systole, RV systolic function  with severe reduction, RV cavity with severe enlargement, RVSP 50.3 mmHg, LA and RA with moderate dilatation, mitral valve replaced noted severe stenosis, aortic valve replaced with severe stenosis,   Acute on chronic cor pulmonale Pulmonary hypertension RV failure   Urine output is 1400 ml Systolic blood pressure 115 to 120 mmHg range.   Continue diuresis with furosemide   Follow up further work up with left and right heart catheterization   Essential hypertension Continue blood pressure monitoring   PAF (paroxysmal atrial fibrillation) (HCC) Continue amiodarone  Continue anticoagulation with heparin  IV until completed cardiac workup.  Continue telemetry   Type 2 diabetes mellitus with hyperlipidemia (HCC) Continue glucose cover and monitoring with insulin  sliding scale.  Continue with statin therapy   Asthma, cough variant COPD No signs of acute exacerbation Continue with bronchodilator therapy   Acquired hypothyroidism Continue levothyroxine    Gastroesophageal reflux disease Continue with pantoprazole        Subjective: Patient very weak and deconditioned, no dyspnea or chest pain.   Physical Exam: Vitals:   10/22/24 2106 10/23/24 0500 10/23/24 0614 10/23/24 0808  BP: 126/62  (!) 133/53 (!) 128/48  Pulse: 79  75 78  Resp: 18  17 18   Temp: 98.9 F (37.2 C)  98.6 F (37 C) 98.6 F (37 C)  TempSrc: Oral  Oral Oral  SpO2: 98%  98% 92%  Weight:  65 kg    Height:       Neurology awake and alert, deconditioned and ill looking appearing ENT with mild pallor with no icterus Cardiovascular with S1 and S2 present and regular with no gallops or rubs, positive systolic murmur at the base and diastolic murmur at the apex No JVD  Respiratory with mild rales with no wheezing Abdomen with no distention  Trace lower extremity edema   Data Reviewed:    Family Communication: I spoke with patient's husband at the bedside, we talked in detail about patient's condition,  plan of care and prognosis and all questions were addressed.   Disposition: Status is: Inpatient Remains inpatient appropriate because: right and left cardiac cath   Planned Discharge Destination: Skilled nursing facility     Author: Elidia Toribio Furnace, MD 10/23/2024 9:36 AM  For on call review [email or web address].  "

## 2024-10-23 NOTE — Assessment & Plan Note (Addendum)
 Echocardiogram with preserved LV systolic function with EF 50 to 55%, interventricular septum is flattened in systole, RV systolic function with severe reduction, RV cavity with severe enlargement, RVSP 50.3 mmHg, LA and RA with moderate dilatation, mitral valve replaced noted severe stenosis, aortic valve replaced with severe stenosis,   Acute on chronic cor pulmonale Pulmonary hypertension RV failure   01/22 cardiac catheterization  RA 10 RV 95/8 PA 96/26 mean 53  PCWP 17  Cardiac output 3,8 index 2.3 (fick) Cardiac output 2,4 index 1,5 (thermodilution)  PVR 9.5 WU (fick) 14.8 (TD)  Pre and post capillary pulmonary hypertension with low output state.   Urine output is 1475 ml Systolic blood pressure 130 mmHg range.   Not candidate for valve surgical intervention  No improvement with milrinone , poor prognosis, transitioned to palliative care.  End stage heart failure  Palliative diuresis with oral torsemide 

## 2024-10-23 NOTE — Plan of Care (Signed)
" °  Problem: Education: Goal: Knowledge of General Education information will improve Description: Including pain rating scale, medication(s)/side effects and non-pharmacologic comfort measures Outcome: Progressing   Problem: Clinical Measurements: Goal: Ability to maintain clinical measurements within normal limits will improve Outcome: Progressing Goal: Will remain free from infection Outcome: Progressing   Problem: Nutrition: Goal: Adequate nutrition will be maintained Outcome: Progressing   Problem: Pain Managment: Goal: General experience of comfort will improve and/or be controlled Outcome: Progressing   Problem: Safety: Goal: Ability to remain free from injury will improve Outcome: Progressing   Problem: Metabolic: Goal: Ability to maintain appropriate glucose levels will improve Outcome: Progressing   "

## 2024-10-23 NOTE — Assessment & Plan Note (Addendum)
 Continue amiodarone  Anticoagulation with apixaban 

## 2024-10-23 NOTE — Assessment & Plan Note (Signed)
 COPD No signs of acute exacerbation Continue with bronchodilator therapy

## 2024-10-23 NOTE — TOC Progression Note (Signed)
 Transition of Care [facility]) - Progression Note    Patient Details  Name: [name] MRN: [id number] Date of Birth: 09/14/1946  [facility] CM/SW Contact  Arlana JINNY Nicholaus [place] Phone Number: [phone] 10/23/2024, 10:29 AM  Clinical Narrative:   HF CSW met with patient and spouse at bedside. Patients spouse stated that its just them two living in the home. Prior to this hospitalization the patient was not driving. Patients spouse stated that they have current services with [facility]; PT/OT/Nurse. Patient spouse stated that she uses a walker, bedside commode, and wheel chair that they borrowed from a friend. They have a scale at home. Patient has a PCP.   HF CSW/CM will continue to follow and monitor for dc readiness.     Expected Discharge Plan: Home w Home Health Services Barriers to Discharge: Continued Medical Work up               Expected Discharge Plan and Services In-house Referral: Clinical Social Work   Post Acute Care Choice: Resumption of Svcs/PTA Provider Living arrangements for the past 2 months: Single Family Home                           HH Arranged: PT HH Agency: [facility] Date [facility] Agency Contacted: 10/09/24 Time HH Agency Contacted: 1138 Representative spoke with at [facility] Agency: Darleene   Social Drivers of Health (SDOH) Interventions SDOH Screenings   Food Insecurity: No Food Insecurity (10/09/2024)  Housing: Low Risk (10/09/2024)  Transportation Needs: No Transportation Needs (10/09/2024)  Utilities: Not At Risk (10/09/2024)  Social Connections: Moderately Isolated (10/09/2024)  Tobacco Use: Low Risk (10/08/2024)    Readmission Risk Interventions    10/09/2024   11:16 AM 09/17/2024   11:14 AM 09/16/2024   11:53 AM  Readmission Risk Prevention Plan  Transportation Screening Complete Complete Complete  Medication Review Oceanographer) Complete Complete Complete  PCP or Specialist appointment within 3-5 days of discharge   Complete   HRI or Home Care Consult Complete Complete Complete  SW Recovery Care/Counseling Consult Complete Complete Complete  Palliative Care Screening Not Applicable Not Applicable Not Applicable  Skilled Nursing Facility Not Applicable Not Applicable Not Applicable

## 2024-10-23 NOTE — Progress Notes (Signed)
 PHARMACY - ANTICOAGULATION CONSULT NOTE  Pharmacy Consult for heparin  Indication: atrial fibrillation  Allergies[1]  Patient Measurements: Height: 5' 2 (157.5 cm) Weight: 65 kg (143 lb 4.8 oz) IBW/kg (Calculated) : 50.1 HEPARIN  DW (KG): 64.6  Vital Signs: Temp: 98.6 F (37 C) (01/21 0614) Temp Source: Oral (01/21 0614) BP: 133/53 (01/21 0614) Pulse Rate: 75 (01/21 0614)  Labs: Recent Labs    10/21/24 0430 10/21/24 1055 10/22/24 0321 10/22/24 0924 10/22/24 2104 10/23/24 0500 10/23/24 0648  HGB 11.1*  --  10.3*  --   --  10.2*  --   HCT 36.3  --  33.8*  --   --  32.8*  --   PLT 88*  --  80*  --   --  102*  --   APTT  --   --   --   --  57* 123* 101*  HEPARINUNFRC  --   --   --   --   --   --  >1.10*  CREATININE  --  1.25*  --  1.25*  --  1.26*  --     Estimated Creatinine Clearance: 31.5 mL/min (A) (by C-G formula based on SCr of 1.26 mg/dL (H)).   Medical History: Past Medical History:  Diagnosis Date   Anemia    Asthma 2010   Atrial fibrillation (HCC)    Atrial flutter (HCC)    Breast carcinoma (HCC) 1995   1995   Cardioembolic stroke (HCC) 01/2012   Right frontal in 01/2012; normal carotid ultrasound; possible LAA thrombus by TEE; virtual complete neurologic recovery   Carotid artery stenosis 1961   Chronic kidney disease, stage 2, mildly decreased GFR    GFR of approximately 60   Diabetes mellitus without complication (HCC) 5+ years ago   Controlled most of time   Diverticulosis of colon (without mention of hemorrhage) 2012   Dr. Golda   Fasting hyperglycemia    120 fasting   Gastroesophageal reflux disease    Gastroparesis    Heart failure with improved ejection fraction (HFimpEF) (HCC) 5+ years   Heart murmur 1961   Hemorrhoids    History of aortic valve replacement with bioprosthetic valve    History of mitral valve replacement with bioprosthetic valve    Hyperlipidemia    Hypertension    pt denies 05/30/13     Dr Alvan chester    Hyponatremia    Hypothyroidism    Rheumatic heart disease    Shortness of breath    Syncope and collapse February, 2024    Assessment: 13 yoF admitted with HFpEF. Pt on apixaban  PTA for AF, to transition to IV heparin  in anticipation of L/RHC later this admit.   Heparin  level is supratherapeutic >1.1 as expected with recent DOAC. aPTT was 123 > repeat aPTT was 101, on 950 units/hr. It is being drawn from same line as heparin  infusion - was paused for 10 minutes and flushed. Given variation in results, got peripheral stick finding aPTT at 105. Hgb 10.2, plt 102. No s/sx of bleeding or infusion issues.   Goal of Therapy:  Heparin  level 0.3-0.7 units/ml aPTT 66-102 seconds Monitor platelets by anticoagulation protocol: Yes   Plan:  Reduce heparin  infusion to 850 units/hr to get in goal range  Daily aPTT, heparin  level, CBC  Thank you for allowing pharmacy to participate in this patient's care,  Suzen Sour, PharmD, BCCCP Clinical Pharmacist  Phone: [phone] 10/23/2024 7:41 AM  Please check AMION for all [facility] Pharmacy phone numbers  After 10:00 PM, call Main Pharmacy [phone]      [1] No Known Allergies

## 2024-10-23 NOTE — Progress Notes (Signed)
 [name] [id number] - [facility] Liaison Note:  This patient is currently enrolled in [facility] outpatient-based palliative care.   Please call for any outpatient based palliative care related questions or concerns.  Thank you,  Greig Basket, BSN, RN [facility] Liaison [phone]

## 2024-10-23 NOTE — Plan of Care (Signed)

## 2024-10-23 NOTE — Progress Notes (Addendum)
 "    Advanced Heart Failure Rounding Note  Cardiologist: Alvan Carrier, MD  [facility] Cardiologist: Dr. Cherrie Chief Complaint: Valvular disease and deconditioning Patient Profile   [name] is a 79 y.o. female with chronic diastolic heart failure, s/p prosthetic AVR and MVR in 2014, moderate COPD (per PFTs in 2014), paroxysmal atrial fibrillation, HTN, DM 2, HLD.   Subjective:    Vital signs stable. She remains on [place] at 2L . Net negative -1.2L. She reports that she continues with profound weakness and fatigue. Husband states that overnight she expressed wishes to keep pushing towards getting better.   Objective:    Weight Range: 65 kg Body mass index is 26.21 kg/m.   Vital Signs:   Temp:  [98.6 F (37 C)-98.9 F (37.2 C)] 98.6 F (37 C) (01/21 0808) Pulse Rate:  [74-79] 78 (01/21 0808) Resp:  [17-18] 18 (01/21 0808) BP: (114-133)/(48-62) 128/48 (01/21 0808) SpO2:  [92 %-100 %] 92 % (01/21 0808) Weight:  [65 kg] 65 kg (01/21 0500) Last BM Date : 10/22/24  Weight change: Filed Weights   10/21/24 0500 10/22/24 0500 10/23/24 0500  Weight: 68.5 kg 64.1 kg 65 kg   Intake/Output:  Intake/Output Summary (Last 24 hours) at 10/23/2024 1051 Last data filed at 10/23/2024 0600 Gross per 24 hour  Intake 162.14 ml  Output 1400 ml  Net -1237.86 ml    Physical Exam   General: Frail appearing. No distress  Cardiac: JVP flat. No murmurs  Extremities: Warm and dry.  Trace BLE edema.  Neuro: A&O x3. Affect flat, drowsy  Labs   CBC Recent Labs    10/22/24 0321 10/23/24 0500  WBC 7.7 9.1  HGB 10.3* 10.2*  HCT 33.8* 32.8*  MCV 87.3 85.2  PLT 80* 102*   Basic Metabolic Panel Recent Labs    98/79/73 0924 10/23/24 0500  NA 137 136  K 4.4 3.5  CL 100 97*  CO2 25 27  GLUCOSE 112* 92  BUN 15 12  CREATININE 1.25* 1.26*  CALCIUM  9.1 8.6*   BNP (last 3 results) Recent Labs    01/25/24 2031 03/14/24 1016 05/30/24 0727  BNP 775.0* 503.0* 528.0*   ProBNP  (last 3 results) Recent Labs    07/24/24 0438 09/11/24 1824 10/08/24 2147  PROBNP 2,155.0* 5,666.0* 5,574.0*   Medications:    Scheduled Medications:  amiodarone   200 mg Oral Daily   atorvastatin   40 mg Oral Daily   budesonide -glycopyrrolate -formoterol   2 puff Inhalation BID   Chlorhexidine  Gluconate Cloth  6 each Topical Daily   feeding supplement  237 mL Oral BID BM   furosemide   40 mg Intravenous BID   Gerhardt's butt cream  1 Application Topical TID   HYDROcodone  bit-homatropine  5 mL Oral QHS   insulin  aspart  0-5 Units Subcutaneous QHS   insulin  aspart  0-9 Units Subcutaneous TID WC   levothyroxine   100 mcg Oral QAC breakfast   lidocaine  HCl (PF)  10 mL Intradermal Once   pantoprazole   40 mg Oral QAC breakfast   sodium chloride  flush  10-40 mL Intracatheter Q12H    Infusions:  heparin  950 Units/hr (10/23/24 0553)    PRN Medications: azelastine , guaiFENesin -dextromethorphan , hydrocortisone  cream, ipratropium-albuterol , meclizine , melatonin, menthol , phenol, polyethylene glycol, prochlorperazine , sodium chloride  flush  Assessment/Plan   Acute on Chronic HFpEF, RV Failure - EF 60-65% with normal RV in 10/25 - Echo 1/13 showed EF 45%, severe RV, and new deterioration of valves (compared to echo 10/25) - Undergoing diuresis with IV  Lasix , UOP has picked up. Remains volume up - Increase IV lasix  to 40 mg bid - no SGLT2i with frequent UTIs - no spiro with hyperkalemia - RHC tentative tomorrow   Rheumatic Heart Disease - s/p bioprosthetic AVR and MVR in 2014 - echo with mod to severe stenosis of AVR (mG 21.3 mmHg) and MVR (mG 10 mmHg) - Cardiac CT shows that she would be a candidate for TMVR. Not candidate for TAVR with risk of obstruction.  - It is appears likely with her debility that fixing her valves may not improve her overall deconditioning. She has expressed multiple times that she si extremely weak and tired. After discussion with her husband, her husband states  that she would like to continue with interventions, if they are warranted.  - discuss with PT the likelihood of functional recovery with rehab - can tentatively plan for RHC tomorrow  - continue to discuss case with interventional team, as they have similar worries about frailty, deconditioning.  - palliative care consulted   Atrial Fibrillation: Paroxysmal - in NSR on tele - continue amio 200 mg daily - on heparin    CKD 3b - baseline Cr 1.1-1.3   Hyperkalemia - resolved  Deconditioning - continue working aggressively with PT/OT   Length of Stay: 15  Jordan Lee, NP  10/23/2024, 10:51 AM  Advanced Heart Failure Team Pager [phone] (M-F; 7a - 5p)   Please visit [email or web address]: For overnight coverage please call cardiology fellow first. If fellow not available call Shock/ECMO MD on call.  For ECMO / Mechanical Support (Impella, IABP, LVAD) issues call Shock / ECMO MD on call.   Patient seen and examined with the above-signed Advanced Practice Provider and/or Housestaff. I personally reviewed laboratory data, imaging studies and relevant notes. I independently examined the patient and formulated the important aspects of the plan. I have edited the note to reflect any of my changes or salient points. I have personally discussed the plan with the patient and/or family.  She remains very weak. Unable to stand. Denies CP or SOB.   Her and her husband have discussed and would like to proceed with TMVR if able  Has been seen by PT with thorough eval.   General:  Sitting up in bed. No resp difficulty Very weak HEENT: normal Neck: supple. JVP 7-8 Cor: Regular rate & rhythm. 2/6 AS s2 ok Lungs: clear Abdomen: soft, nontender, nondistended.Good bowel sounds. Extremities: no cyanosis, clubbing, rash, tr edema Neuro: alert & orientedx3, cranial nerves grossly intact. moves all 4 extremities w/o difficulty. Affect pleasant  Given her overall fraility and debility, I do not think she is  currently a candidate for TMVR as she would struggle to toelrate the procedure and would likely not benefit much from a symptomatic point of view.   Given her and her husband's desire for aggressive care, I think the best plan would be to proceed with R/L cath tomorrow to assess for low output which may be contributing to her debility. If she does not have low output would recommend transfer to SNF for rehab and we can re-evaluate candidacy for TMVR if/when she gets stronger. If low output, we can consider possible milrinone  support to facilitate her rehab efforts.   D/w Dr. Wendel.   Toribio Fuel, MD  2:50 PM  "

## 2024-10-23 NOTE — Progress Notes (Signed)
 "  Rounding Note   Patient Name: [name] Date of Encounter: 10/23/2024  [facility] Cardiologist: Alvan Carrier, MD   Subjective Very weak today.   Remains dyspneic at times.  Scheduled Meds:  amiodarone   200 mg Oral Daily   atorvastatin   40 mg Oral Daily   budesonide -glycopyrrolate -formoterol   2 puff Inhalation BID   Chlorhexidine  Gluconate Cloth  6 each Topical Daily   feeding supplement  237 mL Oral BID BM   furosemide   40 mg Intravenous BID   Gerhardt's butt cream  1 Application Topical TID   HYDROcodone  bit-homatropine  5 mL Oral QHS   insulin  aspart  0-5 Units Subcutaneous QHS   insulin  aspart  0-9 Units Subcutaneous TID WC   levothyroxine   100 mcg Oral QAC breakfast   lidocaine  HCl (PF)  10 mL Intradermal Once   pantoprazole   40 mg Oral QAC breakfast   sodium chloride  flush  10-40 mL Intracatheter Q12H   Continuous Infusions:  heparin  950 Units/hr (10/23/24 0553)   PRN Meds: azelastine , guaiFENesin -dextromethorphan , hydrocortisone  cream, ipratropium-albuterol , meclizine , melatonin, menthol , phenol, polyethylene glycol, prochlorperazine , sodium chloride  flush   Vital Signs  Vitals:   10/22/24 2106 10/23/24 0500 10/23/24 0614 10/23/24 0808  BP: 126/62  (!) 133/53 (!) 128/48  Pulse: 79  75 78  Resp: 18  17 18   Temp: 98.9 F (37.2 C)  98.6 F (37 C) 98.6 F (37 C)  TempSrc: Oral  Oral Oral  SpO2: 98%  98% 92%  Weight:  65 kg    Height:        Intake/Output Summary (Last 24 hours) at 10/23/2024 1030 Last data filed at 10/23/2024 0600 Gross per 24 hour  Intake 162.14 ml  Output 1400 ml  Net -1237.86 ml      10/23/2024    5:00 AM 10/22/2024    5:00 AM 10/21/2024    5:00 AM  Last 3 Weights  Weight (lbs) 143 lb 4.8 oz 141 lb 5 oz 151 lb 0.2 oz  Weight (kg) 65 kg 64.1 kg 68.5 kg      Telemetry NSR - Personally Reviewed  ECG  No new ECG tracing today. - Personally Reviewed  Physical Exam  GEN: No acute distress. On 2L of O2 via  nasal cannula. Neck: JVD improved from yesterday. Cardiac: RRR. III/ VI systolic murmur. Respiratory: Rales at bases MS: No lower extremity edema. Neuro:  No focal deficits. Psych: Normal affect. Responds appropriately.  Labs High Sensitivity Troponin:  No results for input(s): TROPONINIHS in the last 720 hours.  Recent Labs  Lab 10/08/24 2147 10/09/24 0021  TRNPT 42* 41*       Chemistry Recent Labs  Lab 10/17/24 0406 10/18/24 0527 10/19/24 0439 10/20/24 0344 10/21/24 1055 10/22/24 0924 10/23/24 0500  NA 136 135 131*   < > 131* 137 136  K 3.0* 3.6 5.2*   < > 5.9* 4.4 3.5  CL 94* 94* 94*   < > 98 100 97*  CO2 28 30 27    < > 22 25 27   GLUCOSE 106* 92 119*   < > 182* 112* 92  BUN 14 14 19    < > 18 15 12   CREATININE 1.04* 1.04* 1.15*   < > 1.25* 1.25* 1.26*  CALCIUM  8.3* 8.3* 8.7*   < > 9.2 9.1 8.6*  MG 1.7 2.1 1.7  --   --   --   --   GFRNONAA 54* 54* 48*   < > 43* 43* 43*  ANIONGAP 14 11 10    < > 11 12 12    < > = values in this interval not displayed.    Lipids No results for input(s): CHOL, TRIG, HDL, LABVLDL, LDLCALC, CHOLHDL in the last 168 hours.  Hematology Recent Labs  Lab 10/21/24 0430 10/22/24 0321 10/23/24 0500  WBC 9.4 7.7 9.1  RBC 4.20 3.87 3.85*  HGB 11.1* 10.3* 10.2*  HCT 36.3 33.8* 32.8*  MCV 86.4 87.3 85.2  MCH 26.4 26.6 26.5  MCHC 30.6 30.5 31.1  RDW 17.2* 17.3* 17.5*  PLT 88* 80* 102*   Thyroid  No results for input(s): TSH, FREET4 in the last 168 hours.  BNPNo results for input(s): BNP, PROBNP in the last 168 hours.  DDimer No results for input(s): DDIMER in the last 168 hours.   Radiology  ECHOCARDIOGRAM COMPLETE Result Date: 10/22/2024    ECHOCARDIOGRAM REPORT   Patient Name:   [name] Date of Exam: 10/21/2024 Medical Rec #:  [id number]       Height:       62.0 in Accession #:    [id number]      Weight:       151.0 lb Date of Birth:  [birth year]       BSA:          1.697 m Patient Age:    78 years        BP:            121/61 mmHg Patient Gender: F               HR:           70 bpm. Exam Location:  Inpatient Procedure: 2D Echo (Both Spectral and Color Flow Doppler were utilized during            procedure). Indications:    status post aortic valve replacement  History:        Patient has prior history of Echocardiogram examinations, most                 recent 10/17/2024. Chronic kidney disease. History of CVA.,                 rheumatic valve disease, Arrythmias:Paroxysmal Afib,                 Signs/Symptoms:Dyspnea; Risk Factors:Hypertension and                 Dyslipidemia.                 Aortic Valve: 21 mm Magna bioprosthetic valve is present in the                 aortic position. Procedure Date: 07/18/13.                 Mitral Valve: 25 mm Magna Ease bioprosthetic valve valve is                 present in the mitral position. Procedure Date: 07/18/13.  Sonographer:    Tinnie Barefoot RDCS Referring Phys: [id number] CALLIE E GOODRICH IMPRESSIONS  1. Flattened LV septum in systole suggesting PHT. Left ventricular ejection fraction, by estimation, is 50 to 55%. The left ventricle has low normal function. The left ventricle has no regional wall motion abnormalities. Left ventricular diastolic parameters are indeterminate. There is the interventricular septum is flattened in systole, consistent with right ventricular pressure overload.  2. Right ventricular systolic function is severely reduced. The right  ventricular size is severely enlarged. There is moderately elevated pulmonary artery systolic pressure.  3. Left atrial size was moderately dilated.  4. Right atrial size was moderately dilated.  5. 11.79 yo 25 mm Magna Ease bioprosthetic mitral valve Severe residual MAC ? Calcium  noted in lateral and dome of atrium Trivial MR and severe MS Orientation of stent valve abuts the septum and aortic/annular angle seems unfavorable for valve in valve stent valve Rx. The mitral valve has been repaired/replaced. Trivial mitral  valve regurgitation. Severe mitral stenosis. Severe mitral annular calcification. There is a 25 mm Magna Ease bioprosthetic valve present in the mitral position. Procedure Date: 07/18/13.  6. 11,81 yo degenerative 21 mm Magna Ease bioprosthetic AV Trivial AR and severe AS by AVA. Valve leaflets appear calcified . The aortic valve has been repaired/replaced. There is severe calcifcation of the aortic valve. There is severe thickening of the  aortic valve. Aortic valve regurgitation is trivial. Severe aortic valve stenosis. There is a 21 mm Magna bioprosthetic valve present in the aortic position. Procedure Date: 07/18/13. FINDINGS  Left Ventricle: Flattened LV septum in systole suggesting PHT. Left ventricular ejection fraction, by estimation, is 50 to 55%. The left ventricle has low normal function. The left ventricle has no regional wall motion abnormalities. Strain was performed and the global longitudinal strain is indeterminate. The left ventricular internal cavity size was normal in size. There is no left ventricular hypertrophy. The interventricular septum is flattened in systole, consistent with right ventricular pressure overload. Left ventricular diastolic parameters are indeterminate. Right Ventricle: The right ventricular size is severely enlarged. Right vetricular wall thickness was not assessed. Right ventricular systolic function is severely reduced. There is moderately elevated pulmonary artery systolic pressure. The tricuspid regurgitant velocity is 2.97 m/s, and with an assumed right atrial pressure of 15 mmHg, the estimated right ventricular systolic pressure is 50.3 mmHg. Left Atrium: Left atrial size was moderately dilated. Right Atrium: Right atrial size was moderately dilated. Pericardium: There is no evidence of pericardial effusion. Mitral Valve: 11.79 yo 23 mm Magna Ease bioprosthetic mitral valve Severe residual MAC ? Calcium  noted in lateral and dome of atrium Trivial MR and severe MS  Orientation of stent valve abuts the septum and aortic/annular angle seems unfavorable for valve in valve stent valve Rx. The mitral valve has been repaired/replaced. There is severe thickening of the mitral valve leaflet(s). There is severe calcification of the mitral valve leaflet(s). Severely decreased mobility of the mitral valve leaflets. Severe mitral annular calcification. Trivial mitral valve regurgitation. There is a 25 mm Magna Ease bioprosthetic valve present in the mitral position. Procedure Date: 07/18/13. Severe mitral valve stenosis. MV peak gradient, 33.4 mmHg. The mean mitral valve gradient is 16.5 mmHg. Tricuspid Valve: The tricuspid valve is normal in structure. Tricuspid valve regurgitation is mild. Aortic Valve: 7,81 yo degenerative 21 mm Magna Ease bioprosthetic AV Trivial AR and severe AS by AVA. Valve leaflets appear calcified. The aortic valve has been repaired/replaced. There is severe calcifcation of the aortic valve. There is severe thickening of the aortic valve. Aortic valve regurgitation is trivial. Severe aortic stenosis is present. Aortic valve mean gradient measures 21.0 mmHg. Aortic valve peak gradient measures 31.6 mmHg. Aortic valve area, by VTI measures 0.50 cm. There is a 21 mm Magna bioprosthetic valve present in the aortic position. Procedure Date: 07/18/13. Pulmonic Valve: The pulmonic valve was normal in structure. Pulmonic valve regurgitation is mild. Aorta: The aortic root is normal in size and structure. IAS/Shunts: No atrial  level shunt detected by color flow Doppler. Additional Comments: 3D was performed not requiring image post processing on an independent workstation and was indeterminate.  LEFT VENTRICLE PLAX 2D LVIDd:         3.10 cm LVIDs:         2.50 cm LV PW:         1.00 cm LV IVS:        1.10 cm LVOT diam:     1.50 cm LV SV:         30 LV SV Index:   18 LVOT Area:     1.77 cm  LV Volumes (MOD) LV vol d, MOD A2C: 50.4 ml LV vol d, MOD A4C: 38.7 ml LV vol s,  MOD A2C: 37.5 ml LV vol s, MOD A4C: 22.7 ml LV SV MOD A2C:     12.9 ml LV SV MOD A4C:     38.7 ml LV SV MOD BP:      14.7 ml RIGHT VENTRICLE            IVC RV Basal diam:  2.70 cm    IVC diam: 2.00 cm RV S prime:     4.13 cm/s TAPSE (M-mode): 0.7 cm LEFT ATRIUM             Index        RIGHT ATRIUM           Index LA diam:        4.40 cm 2.59 cm/m   RA Area:     16.60 cm LA Vol (A2C):   54.2 ml 31.95 ml/m  RA Volume:   45.00 ml  26.52 ml/m LA Vol (A4C):   83.4 ml 49.16 ml/m LA Biplane Vol: 72.9 ml 42.97 ml/m  AORTIC VALVE AV Area (Vmax):    0.54 cm AV Area (Vmean):   0.46 cm AV Area (VTI):     0.50 cm AV Vmax:           281.00 cm/s AV Vmean:          217.000 cm/s AV VTI:            0.608 m AV Peak Grad:      31.6 mmHg AV Mean Grad:      21.0 mmHg LVOT Vmax:         85.30 cm/s LVOT Vmean:        56.300 cm/s LVOT VTI:          0.172 m LVOT/AV VTI ratio: 0.28  AORTA Ao Root diam: 2.40 cm Ao Asc diam:  2.90 cm MITRAL VALVE              TRICUSPID VALVE MV Area VTI:  0.35 cm    TR Peak grad:   35.3 mmHg MV Peak grad: 33.4 mmHg   TR Vmax:        297.00 cm/s MV Mean grad: 16.5 mmHg MV Vmax:      2.89 m/s    SHUNTS MV Vmean:     194.5 cm/s  Systemic VTI:  0.17 m                           Systemic Diam: 1.50 cm Maude Emmer MD Electronically signed by Maude Emmer MD Signature Date/Time: 10/22/2024/5:25:34 PM    Final    CT CORONARY MORPH W/CTA COR W/SCORE DEL W/CM &/OR WO/CM Addendum Date: 10/21/2024 ADDENDUM REPORT: 10/21/2024 21:18 EXAM: OVER-READ INTERPRETATION  CT CHEST The following report is an over-read performed by radiologist Dr. Vanetta Mortimer [facility] on 10/21/2024. This over-read does not include interpretation of cardiac or coronary anatomy or pathology. The coronary CTA interpretation by the cardiologist is attached. COMPARISON:  CT dated 10/21/2024. FINDINGS: Mild atherosclerotic calcification of the visualized aorta. Top-normal subcarinal lymph node measures 10 mm short axis.  The visualized esophagus is grossly unremarkable. No mediastinal fluid collection. Partially visualized small right and trace left pleural effusions. Faint diffuse hazy density throughout the lungs most consistent with edema. Pneumonia is not excluded. No acute osseous pathology. IMPRESSION: 1. Partially visualized small right and trace left pleural effusions. 2. Faint diffuse hazy density throughout the lungs most consistent with edema. Pneumonia is not excluded. 3.  Aortic Atherosclerosis (ICD10-I70.0). Electronically Signed   By: Vanetta Chou M.D.   On: 10/21/2024 21:18   Result Date: 10/21/2024 CLINICAL DATA:  Severe bioprosthetic aortic valve stenosis. Severe bioprosthetic mitral valve stenosis. AVR with 21 mm Magna Ease 07/19/2013 and 25 mm Magna Ease at same time. EXAM: Cardiac TAVR CT TECHNIQUE: A non-contrast, gated CT scan was obtained with axial slices of 2.5 mm through the heart for aortic valve scoring. A 120 kV retrospective, gated, contrast cardiac scan was obtained. Gantry rotation speed was 250 msec and collimation was 0.6 mm. Nitroglycerin  was not given. A delayed scan was obtained to exclude left atrial appendage thrombus. The 3D dataset was reconstructed in systole with motion correction. The 3D data set was reconstructed in 5% intervals of the 0-95% of the R-R cycle. Systolic and diastolic phases were analyzed on a dedicated workstation using MPR, MIP, and VRT modes. The patient received 100 cc of contrast. FINDINGS: Image quality: Excellent. Noise artifact is: Limited. Mitral prosthesis: A 25 mm Magna ease prosthesis is present in the mitral position. The leaflets and calcified and thickened. Two of the leaflets are fixed and immobile consistent with structural deterioration. No evidence of valve thrombosis. The valve area is 0.79 cm2 by direct planimetry. ViV analysis: A valve in valve analysis was performed using a 26 mm S3. The neo-LVOT is 254 mm2, indicating low-risk for outflow  traction obstruction. The aorto-mitral angle is 127 degrees which is favorable. Optimal coplanar projection: RAO 23 CRA 1 Aortic prosthesis: A 21 mm Magna ease prosthesis is present in the aortic position. The leaflets are calcified and thickened with restricted leaflet motion in systole. Findings consistent with structural deterioration. ViV analysis: A valve in valve analysis was performed using a 23 mm virtual transcatheter heart valve. The virtual valve to coronary (VTC) distance for the left main is 3.5 mm indicating high-risk for coronary obstruction. The RCA takes off above the virtual valve indicating low-risk for coronary obstruction. Optimal coplanar projection: LAO 11 CAU 2 Aorta: Sinus of Valsalva Measurements: Non-coronary: 27 mm Right-coronary: 27 mm Left-coronary: 27 mm Sinotubular Junction: 26 mm Ascending Thoracic Aorta: 30 mm Coronary Arteries: Normal coronary origin. Right dominance. This study did not use nitroglycerin  and stenosis severity could not be determined. Right Atrium: Right atrial size is severely dilated. Contrast reflux into the IVC consistent with elevated RA pressure. Right Ventricle: The right ventricular cavity is severely dilated. The septum is flattened in systole/diastole concerning for volume/pressure overload. Left Atrium: Left atrial size is severely dilated. The LAA appears to be absent or surgically ligated, no filling defect. Left Ventricle: The ventricular cavity size is within normal limits. Pulmonary arteries: Dilated pulmonary artery suggestive of pulmonary hypertension. Pulmonary veins: Normal pulmonary venous drainage. Pericardium:  Normal thickness with no significant effusion or calcium  present. Extra-cardiac findings: See attached radiology report for non-cardiac structures. IMPRESSION: 1. Bioprosthetic aortic and mitral prostheses with evidence of structural deterioration as detailed above. 2. ViV analysis favorable for TMVR with low risk of outflow tract  obstruction. 3. ViV analysis indicates high risk for coronary obstruction for TAVR. Left main VTC 3.5 mm. 4. Severely dilated right ventricle and right atrium. Contrast reflux into the IVC consistent with elevated RA pressure. 5. Dilated pulmonary artery suggestive of pulmonary hypertension. 6. The septum is flattened in systole/diastole concerning for volume/pressure overload. Darryle T. Barbaraann, MD Electronically Signed: By: Darryle Barbaraann M.D. On: 10/21/2024 21:05   CT Angio Abd/Pel w/ and/or w/o Result Date: 10/21/2024 EXAM: CTA CHEST, ABDOMEN AND PELVIS WITH CONTRAST 10/21/2024 03:37:00 PM TECHNIQUE: CTA of the chest was performed with the administration of intravenous contrast. CTA of the abdomen and pelvis was performed with the administration of intravenous contrast. Multiplanar reformatted images are provided for review. MIP images are provided for review. Automated exposure control, iterative reconstruction, and/or weight based adjustment of the mA/kV was utilized to reduce the radiation dose to as low as reasonably achievable. COMPARISON: CT abdomen/pelvis dated 09/11/2024 and CTA chest dated 01/25/2024. CLINICAL HISTORY: Aortic valve replacement (TAVR), pre-op eval. FINDINGS: VASCULATURE: AORTA: No acute finding. No abdominal aortic aneurysm. No dissection. Mild thoracic aortic atherosclerosis. PULMONARY ARTERIES: Study not tailored for evaluation of the pulmonary arteries, but there is no pulmonary embolism to the lobar level. GREAT VESSELS OF AORTIC ARCH: No acute finding. No dissection. No arterial occlusion or significant stenosis. CELIAC TRUNK: Mild atherosclerotic calcifications at the origin of the celiac artery, patent. SUPERIOR MESENTERIC ARTERY: Mild atherosclerotic calcifications at the origin of the SMA, patent. INFERIOR MESENTERIC ARTERY: IMA is patent. RENAL ARTERIES: Mild atherosclerotic calcifications at the origin of the bilateral renal arteries, patent. ILIAC ARTERIES: Atherosclerotic  calcifications of the bilateral iliac arteries, patent. CHEST: MEDIASTINUM: Cardiomegaly. Status post aortic and mitral valve replacement. Small mediastinal nodes, including an 11 mm short axis right azygoesophageal recess node, likely reactive. LUNGS AND PLEURA: Lung apices excluded. Small bilateral pleural effusions, partially loculated. Left pleural calcification, chronic, suggesting prior pleurodesis. Faint perihilar ground glass opacity suggests very mild interstitial edema. 4 mm subpleural left lower lobe nodule (image 52), unchanged since at least 2023, benign. No follow-up is recommended. No pneumothorax. THORACIC BONES AND SOFT TISSUES: Median sternotomy. Radiation changes in the anterior right hemithorax. Status post right breast lumpectomy. Degenerative changes of the thoracolumbar spine. ABDOMEN AND PELVIS: LIVER: The liver is unremarkable. GALLBLADDER AND BILE DUCTS: Gallbladder is unremarkable. No biliary ductal dilatation. SPLEEN: The spleen is unremarkable. PANCREAS: The pancreas is unremarkable. ADRENAL GLANDS: Bilateral adrenal glands demonstrate no acute abnormality. KIDNEYS, URETERS AND BLADDER: Bilateral renal cortical scarring/atrophy. No stones in the kidneys or ureters. No hydronephrosis. No perinephric or periureteral stranding. Urinary bladder is unremarkable. GI AND BOWEL: Stomach and duodenal sweep demonstrate no acute abnormality. Normal appendix (image 134). Sigmoid diverticulosis, without evidence of diverticulitis. Mildly increased rectal stool burden with mild presacral stranding but no findings suspicious for stercoral colitis. There is no bowel obstruction. No abnormal bowel wall thickening or distension. REPRODUCTIVE: Uterus is unremarkable. PERITONEUM AND RETROPERITONEUM: No ascites or free air. LYMPH NODES: No lymphadenopathy. ABDOMINAL BONES AND SOFT TISSUES: Degenerative changes of the thoracolumbar spine. No acute soft tissue abnormality. IMPRESSION: 1. No thoracoabdominal  aortic aneurysm or dissection. No pulmonary embolism. 2. Atherosclerotic calcifications of the abdominopelvic vasculature, which remain patent. 3. Cardiomegaly. Very mild  interstitial edema is possible. Small bilateral pleural effusions, partially loculated. 4. Status post aortic and mitral valve replacement. 5. Additional ancillary findings, as above. Electronically signed by: Pinkie Pebbles MD 10/21/2024 07:40 PM EST RP Workstation: [id number]   CT ANGIO CHEST AORTA W/CM & OR WO/CM Result Date: 10/21/2024 EXAM: CTA CHEST, ABDOMEN AND PELVIS WITH CONTRAST 10/21/2024 03:37:00 PM TECHNIQUE: CTA of the chest was performed with the administration of intravenous contrast. CTA of the abdomen and pelvis was performed with the administration of intravenous contrast. Multiplanar reformatted images are provided for review. MIP images are provided for review. Automated exposure control, iterative reconstruction, and/or weight based adjustment of the mA/kV was utilized to reduce the radiation dose to as low as reasonably achievable. COMPARISON: CT abdomen/pelvis dated 09/11/2024 and CTA chest dated 01/25/2024. CLINICAL HISTORY: Aortic valve replacement (TAVR), pre-op eval. FINDINGS: VASCULATURE: AORTA: No acute finding. No abdominal aortic aneurysm. No dissection. Mild thoracic aortic atherosclerosis. PULMONARY ARTERIES: Study not tailored for evaluation of the pulmonary arteries, but there is no pulmonary embolism to the lobar level. GREAT VESSELS OF AORTIC ARCH: No acute finding. No dissection. No arterial occlusion or significant stenosis. CELIAC TRUNK: Mild atherosclerotic calcifications at the origin of the celiac artery, patent. SUPERIOR MESENTERIC ARTERY: Mild atherosclerotic calcifications at the origin of the SMA, patent. INFERIOR MESENTERIC ARTERY: IMA is patent. RENAL ARTERIES: Mild atherosclerotic calcifications at the origin of the bilateral renal arteries, patent. ILIAC ARTERIES: Atherosclerotic  calcifications of the bilateral iliac arteries, patent. CHEST: MEDIASTINUM: Cardiomegaly. Status post aortic and mitral valve replacement. Small mediastinal nodes, including an 11 mm short axis right azygoesophageal recess node, likely reactive. LUNGS AND PLEURA: Lung apices excluded. Small bilateral pleural effusions, partially loculated. Left pleural calcification, chronic, suggesting prior pleurodesis. Faint perihilar ground glass opacity suggests very mild interstitial edema. 4 mm subpleural left lower lobe nodule (image 52), unchanged since at least 2023, benign. No follow-up is recommended. No pneumothorax. THORACIC BONES AND SOFT TISSUES: Median sternotomy. Radiation changes in the anterior right hemithorax. Status post right breast lumpectomy. Degenerative changes of the thoracolumbar spine. ABDOMEN AND PELVIS: LIVER: The liver is unremarkable. GALLBLADDER AND BILE DUCTS: Gallbladder is unremarkable. No biliary ductal dilatation. SPLEEN: The spleen is unremarkable. PANCREAS: The pancreas is unremarkable. ADRENAL GLANDS: Bilateral adrenal glands demonstrate no acute abnormality. KIDNEYS, URETERS AND BLADDER: Bilateral renal cortical scarring/atrophy. No stones in the kidneys or ureters. No hydronephrosis. No perinephric or periureteral stranding. Urinary bladder is unremarkable. GI AND BOWEL: Stomach and duodenal sweep demonstrate no acute abnormality. Normal appendix (image 134). Sigmoid diverticulosis, without evidence of diverticulitis. Mildly increased rectal stool burden with mild presacral stranding but no findings suspicious for stercoral colitis. There is no bowel obstruction. No abnormal bowel wall thickening or distension. REPRODUCTIVE: Uterus is unremarkable. PERITONEUM AND RETROPERITONEUM: No ascites or free air. LYMPH NODES: No lymphadenopathy. ABDOMINAL BONES AND SOFT TISSUES: Degenerative changes of the thoracolumbar spine. No acute soft tissue abnormality. IMPRESSION: 1. No thoracoabdominal  aortic aneurysm or dissection. No pulmonary embolism. 2. Atherosclerotic calcifications of the abdominopelvic vasculature, which remain patent. 3. Cardiomegaly. Very mild interstitial edema is possible. Small bilateral pleural effusions, partially loculated. 4. Status post aortic and mitral valve replacement. 5. Additional ancillary findings, as above. Electronically signed by: Pinkie Pebbles MD 10/21/2024 07:40 PM EST RP Workstation: [id number]    Cardiac Studies  Limited Echocardiogram 10/15/2024: Impressions: 1. Left ventricular ejection fraction, by estimation, is 45%. The left  ventricle has mildly decreased function. Left ventricular endocardial  border not optimally defined to evaluate regional  wall motion. Left  ventricular diastolic function could not be  evaluated. There is the interventricular septum is flattened in systole  and diastole, consistent with right ventricular pressure and volume  overload.   2. Right ventricular systolic function is severely reduced. The right  ventricular size is moderately enlarged. There is moderately elevated  pulmonary artery systolic pressure. The estimated right ventricular  systolic pressure is 54.8 mmHg.   3. The inferior vena cava is normal in size with <50% respiratory  variability, suggesting right atrial pressure of 8 mmHg.  _______________   Limited Echocardiogram 10/17/2024: Impressions: 1. Limited study.   2. Left ventricular ejection fraction, by estimation, is 60 to 65%. The  left ventricle has normal function. The left ventricle has no regional  wall motion abnormalities. There is the interventricular septum is  flattened in systole and diastole,  consistent with right ventricular pressure and volume overload.   3. RVSP not estimated. Right ventricular systolic function is severely  reduced. The right ventricular size is moderately enlarged.   4. Papillary muscle calcification noted. Trivial mitral valve  regurgitation.  Moderate to severe mitral stenosis. The mean mitral valve  gradient is 10.0 mmHg. There is a 25 mm Magna Ease bioprosthetic valve  present in the mitral position.   5. There is mild calcification of the prosthetic aortic valve. Aortic  valve regurgitation is at least mild, not clear that PHT is accurate and  jet somewhat obscured by transmitral flow jet. Moderate to severe aortic  valve stenosis. There is a 21 mm  Magna Ease bioprosthetic valve present in the aortic position. Aortic  valve mean gradient measures 21.3 mmHg. Dimentionless index 0.25.   Comparison(s): If clinically reasonable, consider TEE for further  evaluation of prosthetic valvular function. Both mitral and aortic mean  gradients have increased compared with prior complete study.   Patient Profile   79 y.o. female  with a history of chronic HFpEF, rheumatic heart disease s/p bioproshetic AVR and MVR in 2014, paroxysmal atrial fibrillation/flutter on Eliquis , CVA hypertension, hyperlipidemia, type 2 diabetes mellitus, CKD stage III, and breast cancer who was admitted at [facility] on 10/09/2023 for acute hypoxic respiratory failure secondary to acute on chronic CHF after presenting with sudden on set of shortness of breath. She was diuresed with IV Lasix . Echo showed normal LV function and severe RV dysfunction as well new moderate to severe stenosis of both MVR and AVR which was new compared to 07/2024. Therefore, she was transferred to [facility] for CT cardiac TAVR protocol.   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 I am concerned about the patient's aortic  valve is well Likely moderate bioprosthetic aortic valve degeneration however the ventricle is underfilled I suspect when mitral stenosis is relieved and that the aortic stenosis will become significant Her left main however is at some jeopardy with the valve in valve TAVR Would need to consider coronary protection versus BASILICA if anything is pursued The patient is very deconditioned.  Had a long conversation with husband, wife, and Dr. Bensimohn.  It is unclear whether she is a candidate for any intervention given her degree of deconditioning.  Appreciate AHF input.  PT and palliative care to see. RV failure Due to bioprosthetic MV stenosis See discussion above PAF Remains in normal sinus rhythm On hep gtt in case cor angio/RHC is pursued T2DM Continue anticoagulation Atorvastatin  40 mg Not a good candidate for SGLT2 inhibitor due to UTI    Discussed at length with patient and husband at bedside.  Lurena Red, MD Pager [phone]  Patient ID: [name], female   DOB: 02/11/1946, 79 y.o.   MRN: [id number]  "

## 2024-10-23 NOTE — Assessment & Plan Note (Addendum)
 Holding antihypertensive agents to avoid hypotension

## 2024-10-23 NOTE — Consult Note (Signed)
 "                                                  Palliative Care Consult Note                                  Date: 10/23/2024   Patient Name: [name]  DOB: 06-27-1946  MRN: [id number]  Age / Sex: 79 y.o., female  PCP: Sheryle Carwin, MD Referring Physician: Noralee Elidia Toribio DEWAINE  Reason for Consultation: Establishing goals of care  Past Medical History:  Diagnosis Date   Anemia    Asthma 2010   Atrial fibrillation [facility])    Atrial flutter (HCC)    Breast carcinoma (HCC) 1995   1995   Cardioembolic stroke (HCC) 01/2012   Right frontal in 01/2012; normal carotid ultrasound; possible LAA thrombus by TEE; virtual complete neurologic recovery   Carotid artery stenosis 1961   Chronic kidney disease, stage 2, mildly decreased GFR    GFR of approximately 60   Diabetes mellitus without complication (HCC) 5+ years ago   Controlled most of time   Diverticulosis of colon (without mention of hemorrhage) 2012   Dr. Golda   Fasting hyperglycemia    120 fasting   Gastroesophageal reflux disease    Gastroparesis    Heart failure with improved ejection fraction (HFimpEF) (HCC) 5+ years   Heart murmur 1961   Hemorrhoids    History of aortic valve replacement with bioprosthetic valve    History of mitral valve replacement with bioprosthetic valve    Hyperlipidemia    Hypertension    pt denies 05/30/13     Dr Alvan chester   Hyponatremia    Hypothyroidism    Rheumatic heart disease    Shortness of breath    Syncope and collapse February, 2024    Subjective:   This NP Camellia Kays reviewed medical records, received report from team, assessed the patient and then meet at the patient's bedside to discuss diagnosis, prognosis, GOC, EOL wishes disposition and options.  Before meeting with the patient/family, I spent time reviewing the chart notes including nursing note from yesterday, cardiology note from yesterday, heart failure note from yesterday, internal medicine note from  yesterday, internal medicine note from today, cardiology consulted, heart failure from today. I also reviewed vital signs, nursing flowsheets, medication administrations record, labs, and imaging. Labs reviewed include BMP which shows stable but elevated creatinine at 1.26 in the setting of heart failure.  Review of notes indicates patient with acute on chronic HFpEF with RV failure and deterioration of mitral valve and aortic valve, mitral worsening aortic.  Possible consideration of TMVR, although may be too frail.  Ongoing evaluation for candidacy.  I met with the patient at bedside, her husband was present.   We meet to discuss diagnosis prognosis, GOC, EOL wishes, disposition and options. Concept of Palliative Care was introduced as specialized medical care for people and their families living with serious illness.  If focuses on providing relief from the symptoms and stress of a serious illness.  The goal is to improve quality of life for both the patient and the family. Values and goals of care important to patient and family were attempted to be elicited.  Created space and opportunity for patient  and family to explore thoughts and feelings regarding current medical situation   Natural trajectory and current clinical status were discussed. Questions and concerns addressed. Patient  encouraged to call with questions or concerns.    Patient/Family Understanding of Illness: The patient has been states that Dr. Cherrie with the heart failure team explained to options: Comfort care versus a procedure to fix mitral valve, not a candidate to fix the aortic valve.  He described the procedure as having an 80/20% chance of success/death.  He said the ultimate decision would be the patient's and if she survives there is no guarantee of improvement quality of life.  Indicated also likely need of rehab.  However, today the message he got is that they are questioning if he is strong enough for the procedure  and if so would improve her life.  They indicated that it would be up to the medical team on whether or not to do the procedure, not the patient.  We spent time talking about the need to qualify her as a candidate for the procedure first and then if she is a candidate, it would be up to the patient whether she would want to proceed.  He states that in September she had a syncopal episode, in October seem to get weaker and was diagnosed with a UTI.  She has not been the same since.  Hospitalization January 6 was about the time they first noticed the valve deterioration.  I spent a lot of time talking about specifics and clinical details related to her heart and her poor valves as well as options.  Life Review: The patient and her husband have been married for 58 years and have 1 daughter and 3 granddaughters.  She previously enjoyed playing golf and now occasionally watches golf.  She also enjoyed gardening and was very proud of the roses that she raised.  She also very much enjoys walking and has been missing being able to do that.  She is religious and practices in the [facility] tradition.  I offered spiritual care consult for chaplaincy support and she accepted.  Baseline Status: Approximately 6 months ago she was household ambulatory and mostly functional.  Her husband states that last summer she did a lot of walking and shopping as well as gardening and would wear me out with her shopping.  She has had some decline in function since then and severe weakness and fatigue since early January.  Today's Discussion: In addition to discussions described above we had extensive discussion on various topics.  Again we spent a lot of time talking about pathophysiology around heart failure, poor valves, and their understanding of her current clinical state and options being offered.  Resident seems confused about initially it being a decision for the patient but now being a decision for the medical team.   We spent a lot of time talking the need to address candidacy for procedure sure that it is something that she would be able to withstand.  If so, then it would be up to the patient whether to proceed.  We spent a lot of time talking about the risks of the procedure and the possibility that it may not improve her quality of life.  If she did survive the procedure she would still likely have a very long road to recovery including a lot of rehab.  They understand this.  Prior to coming to [facility], while she is in any pain, she was able to ambulate with  the mobility specialist.  We talked about PT trying to come today to work with her as well.  I did not of her conversation, the patient and her husband were clear that they would be open to all offered interventions to try to improve her clinical situation.  They understand it would be a very long road to recovery and they also understand that further workup would be needed, such as a possible heart cath tomorrow.  They understand it is not a guarantee that she will be a candidate for this procedure.  I shared that, given possible heart cath tomorrow, I would back off and follow-up on Friday for ongoing discussions and reviewing results and recommendations with the medical team.  I provided my contact card with contact information for the palliative medicine team for any questions or concerns while she remains admitted.  I provided emotional and general support through therapeutic listening, empathy, sharing of stories, and other techniques. I answered all questions and addressed all concerns to the best of my ability.  Goals: DNR-Limited, full scope of care otherwise, open to all offered and available interventions and procedures to improve her clinical situation.  Review of Systems  Constitutional:  Positive for fatigue.  Respiratory:  Negative for shortness of breath.   Cardiovascular:  Negative for chest pain.  Gastrointestinal:  Negative for  abdominal pain, nausea and vomiting.  Neurological:  Positive for weakness.    Objective:   Primary Diagnoses: Present on Admission:  Essential hypertension  Acquired hypothyroidism  Gastroesophageal reflux disease  Asthma, cough variant  Acute on chronic diastolic (congestive) heart failure (HCC)   Vital Signs:  BP (!) 128/48 (BP Location: Right Arm)   Pulse 78   Temp 98.6 F (37 C) (Oral)   Resp 18   Ht 5' 2 (1.575 m)   Wt 65 kg   SpO2 92%   BMI 26.21 kg/m   Physical Exam Vitals and nursing note reviewed.  Constitutional:      Appearance: She is ill-appearing.     Comments: Appears frail and weak  HENT:     Head: Normocephalic and atraumatic.  Cardiovascular:     Rate and Rhythm: Normal rate.  Pulmonary:     Effort: Pulmonary effort is normal. No respiratory distress.  Abdominal:     General: Abdomen is flat. There is no distension.     Palpations: Abdomen is soft.  Skin:    General: Skin is warm and dry.  Neurological:     General: No focal deficit present.     Mental Status: She is alert and oriented to person, place, and time.  Psychiatric:        Mood and Affect: Mood normal.        Behavior: Behavior normal.     Palliative Assessment/Data: 40%   Assessment & Plan:   HPI/Patient Profile: 79 y.o. female  with past medical history of paroxysmal A-fib on Eliquis , chronic HFpEF, prior CVA, carotid artery stenosis, valvular heart disease, CKD 3B, hypertension, hyperlipidemia, type 2 diabetes, hypothyroidism, COPD, bioprosthetic aortic and mitral valve 07/18/2013 presenting with shortness of breath that began on the evening of 10/09/2023.  After emergency department workup she was admitted on 10/08/2024 with acute on chronic HFpEF with RV failure, rheumatic heart disease, paroxysmal A-fib, CKD 3B, significant deconditioning, and others.   Palliative medicine was consulted for goals of care conversations and assistance with complex decision making.  SUMMARY  OF RECOMMENDATIONS   DNR-Limited Continue current scope of care Continue workup  to establish possible candidacy for TMVR Patient and husband are open to all offered and available interventions to improve her clinical situation She understands, if offered and survives the procedure, it would be a long road to recovery Anticipate possible [facility] tomorrow for ongoing workup Palliative medicine will follow-up on Friday, 10/25/2024 Please notify us  of any significant clinical change or new palliative needs in the interim  Symptom Management:  Per primary team Palliative medicine is available to assist as needed  Code Status: DNR - Limited (DNR/DNI)  Prognosis:  Unable to determine  Discharge Planning:  To Be Determined   Discussed with: Patient, family, medical team, nursing team    Thank you for allowing us  to participate in the care of REKIA KUJALA PMT will continue to support holistically.  Time Total: 80 min  Detailed review of medical records (labs, imaging, vital signs), medically appropriate exam, discussed with treatment team, counseling and education to patient, family, & staff, documenting clinical information, medication management, coordination of care  Signed by: Camellia Kays, NP Palliative Medicine Team  Team Phone # [phone] (Nights/Weekends)  10/23/2024, 1:51 PM  "

## 2024-10-23 NOTE — Assessment & Plan Note (Signed)
 Continue levothyroxine 

## 2024-10-23 NOTE — Assessment & Plan Note (Signed)
 Continue with pantoprazole 

## 2024-10-23 NOTE — Progress Notes (Addendum)
 Physical Therapy Treatment Patient Details Name: [name] MRN: [id number] DOB: [birth year] Today's Date: 10/23/2024   History of Present Illness 79 y.o. female admitted 10/08/24 with worsening SOB, BLE edema. Workup for acute hypoxemic respiratory failure secondary to acute on chronic HFpEF, new severe RV dysfunction, E. Coli UTI. Repeat echo 1/15 with mod-severe prosthetic MS and AS. Potential for RHC 1/22. PMH includes PAF on Eliquis , HFpEF, CVA, carotid artery stenosis, CKD, HTN, HLD, DM2, COPD, lumbar sx, mild cognitive impairment, s/p bioprosthetic aortic and mitral valve (2014), fall with ICH/IVH (06/2024).   PT Comments  Pt slowly progressing with mobility. Pt tolerating brief bouts of standing activity with RW and min-modA, quick to fatigue requiring prolonged seated rest break to recover. Despite significant fatigue, pt motivated to participate. Pt remains limited by generalized weakness, decreased activity tolerance, and impaired balance strategies/postural reactions. Continue to recommend post-acute rehab (< 3 hrs/day) to maximize functional mobility and independence.     If plan is discharge home, recommend the following: A lot of help with walking and/or transfers;A lot of help with bathing/dressing/bathroom;Assistance with cooking/housework;Assist for transportation;Help with stairs or ramp for entrance   Can travel by private vehicle     No  Equipment Recommendations   (TDB next venue)    Recommendations for Other Services       Precautions / Restrictions Precautions Precautions: Fall;Other (comment) Recall of Precautions/Restrictions: Impaired Precaution/Restrictions Comments: watch SpO2 (does not wear baseline); urine incontinence Restrictions Weight Bearing Restrictions Per Provider Order: No     Mobility  Bed Mobility Overal bed mobility: Needs Assistance Bed Mobility: Supine to Sit, Sit to Supine, Rolling Rolling: Modified independent (Device/Increase time),  Used rails   Supine to sit: Mod assist, HOB elevated Sit to supine: Mod assist   General bed mobility comments: modA for HHA to elevate trunk and scoot hips to EOB, increased time and effort, seated rest break once EOB secondary to fatigue and SOB; modA for LE management return to supine.    Transfers Overall transfer level: Needs assistance Equipment used: Rolling walker (2 wheels) Transfers: Sit to/from Stand Sit to Stand: Min assist, Mod assist           General transfer comment: sit<>stand x3 to RW, repeated cues for correct hand placement and sequencing as pt frequently pulling on RW that was tipping over and unable to stand; initial minA for trunk elevation, modA on subsequent trials with increased fatigue; pt still pulling on RW    Ambulation/Gait Ambulation/Gait assistance: Min assist, Mod assist   Assistive device: Rolling walker (2 wheels)       Pre-gait activities: pt tolerated 3x brief bouts (~10-15 sec ea) standing/pre-gait, including marching in place and side steps towards HOB, min-modA for stability and RW management, quick to fatigue requiring prolonged seated rest break to recover     Stairs             Wheelchair Mobility     Tilt Bed    Modified Rankin (Stroke Patients Only)       Balance Overall balance assessment: Needs assistance Sitting-balance support: Single extremity supported, Feet supported Sitting balance-Leahy Scale: Poor Sitting balance - Comments: heavy lean onto UE support endorsing fatigue   Standing balance support: Single extremity supported, During functional activity, Reliant on assistive device for balance, Bilateral upper extremity supported Standing balance-Leahy Scale: Poor  Communication Communication Communication: No apparent difficulties  Cognition Arousal: Alert Behavior During Therapy: Flat affect   PT - Cognitive impairments: History of cognitive impairments,  Awareness, Problem solving, Memory                       PT - Cognition Comments: per chart, h/o mild cognitive impairment. noted some memory impairment. following simple commands appropriately. suspect flat affect exacerbated by significant fatigue Following commands: Intact Following commands impaired: Only follows one step commands consistently    Cueing Cueing Techniques: Verbal cues, Tactile cues  Exercises Other Exercises Other Exercises: pt aware she was soiled but unable to tolerate standing pericare/hygiene due to fatigue, pt able to perform anterior hygiene supine with HOB elevated and assist for setup    General Comments General comments (skin integrity, edema, etc.): SpO2 96% on 1L O2 [place]; SpO2 94% on RA with brief bouts of activity, replaced 1L O2  for comfort at end of session. pt's husband present and supportive. educ pt and husband re: current functional mobility status, role of acute PT, POC, activity recommendations, supine BUE/BLE AROM, pulmonary hygiene, potential d/c needs      Pertinent Vitals/Pain Pain Assessment Pain Assessment: No/denies pain Pain Intervention(s): Monitored during session    Home Living                          Prior Function            PT Goals (current goals can now be found in the care plan section) Acute Rehab PT Goals Patient Stated Goal: regain strength PT Goal Formulation: With patient/family Time For Goal Achievement: 11/06/24 Potential to Achieve Goals: Fair Progress towards PT goals: Goals downgraded-see care plan    Frequency    Min 2X/week      PT Plan      Co-evaluation              AM-PAC PT 6 Clicks Mobility   Outcome Measure  Help needed turning from your back to your side while in a flat bed without using bedrails?: A Little Help needed moving from lying on your back to sitting on the side of a flat bed without using bedrails?: A Lot Help needed moving to and from a bed to a  chair (including a wheelchair)?: A Lot Help needed standing up from a chair using your arms (e.g., wheelchair or bedside chair)?: A Lot Help needed to walk in hospital room?: Total Help needed climbing 3-5 steps with a railing? : Total 6 Click Score: 11    End of Session Equipment Utilized During Treatment: Gait belt;Oxygen  Activity Tolerance: Patient limited by fatigue Patient left: in bed;with call bell/phone within reach;with family/visitor present;with bed alarm set Nurse Communication: Mobility status PT Visit Diagnosis: Unsteadiness on feet (R26.81);Other abnormalities of gait and mobility (R26.89);Muscle weakness (generalized) (M62.81)     Time: 8650-8585 PT Time Calculation (min) (ACUTE ONLY): 25 min  Charges:    $Therapeutic Activity: 23-37 mins PT General Charges $$ ACUTE PT VISIT: 1 Visit                      Darice Almas, PT, DPT Acute Rehabilitation Services  Personal: Secure Chat Rehab Office: [phone]  Darice LITTIE Almas 10/23/2024, 3:23 PM

## 2024-10-24 ENCOUNTER — Encounter (HOSPITAL_COMMUNITY): Payer: Self-pay | Admitting: Internal Medicine

## 2024-10-24 ENCOUNTER — Encounter (HOSPITAL_COMMUNITY)
Admission: EM | Disposition: A | Discharge status: Hospice | Payer: Self-pay | Source: Home / Self Care | Attending: Internal Medicine

## 2024-10-24 DIAGNOSIS — I480 Paroxysmal atrial fibrillation: Secondary | ICD-10-CM | POA: Diagnosis not present

## 2024-10-24 DIAGNOSIS — I5033 Acute on chronic diastolic (congestive) heart failure: Secondary | ICD-10-CM | POA: Diagnosis not present

## 2024-10-24 DIAGNOSIS — E1169 Type 2 diabetes mellitus with other specified complication: Secondary | ICD-10-CM | POA: Diagnosis not present

## 2024-10-24 DIAGNOSIS — I10 Essential (primary) hypertension: Secondary | ICD-10-CM | POA: Diagnosis not present

## 2024-10-24 HISTORY — PX: RIGHT/LEFT HEART CATH AND CORONARY ANGIOGRAPHY: CATH118266

## 2024-10-24 LAB — POCT I-STAT 7, (LYTES, BLD GAS, ICA,H+H)
Acid-base deficit: 1 mmol/L (ref 0.0–2.0)
Bicarbonate: 22.1 mmol/L (ref 20.0–28.0)
Calcium, Ion: 0.82 mmol/L — CL (ref 1.15–1.40)
HCT: 28 % — ABNORMAL LOW (ref 36.0–46.0)
Hemoglobin: 9.5 g/dL — ABNORMAL LOW (ref 12.0–15.0)
O2 Saturation: 92 %
Potassium: 2.9 mmol/L — ABNORMAL LOW (ref 3.5–5.1)
Sodium: 143 mmol/L (ref 135–145)
TCO2: 23 mmol/L (ref 22–32)
pCO2 arterial: 30.1 mmHg — ABNORMAL LOW (ref 32–48)
pH, Arterial: 7.474 — ABNORMAL HIGH (ref 7.35–7.45)
pO2, Arterial: 58 mmHg — ABNORMAL LOW (ref 83–108)

## 2024-10-24 LAB — CBC
HCT: 34.7 % — ABNORMAL LOW (ref 36.0–46.0)
Hemoglobin: 10.7 g/dL — ABNORMAL LOW (ref 12.0–15.0)
MCH: 26.4 pg (ref 26.0–34.0)
MCHC: 30.8 g/dL (ref 30.0–36.0)
MCV: 85.7 fL (ref 80.0–100.0)
Platelets: 112 K/uL — ABNORMAL LOW (ref 150–400)
RBC: 4.05 MIL/uL (ref 3.87–5.11)
RDW: 17.3 % — ABNORMAL HIGH (ref 11.5–15.5)
WBC: 8.5 K/uL (ref 4.0–10.5)
nRBC: 0 % (ref 0.0–0.2)

## 2024-10-24 LAB — POCT I-STAT EG7
Acid-Base Excess: 3 mmol/L — ABNORMAL HIGH (ref 0.0–2.0)
Acid-Base Excess: 5 mmol/L — ABNORMAL HIGH (ref 0.0–2.0)
Bicarbonate: 27.4 mmol/L (ref 20.0–28.0)
Bicarbonate: 29.1 mmol/L — ABNORMAL HIGH (ref 20.0–28.0)
Calcium, Ion: 1.11 mmol/L — ABNORMAL LOW (ref 1.15–1.40)
Calcium, Ion: 1.16 mmol/L (ref 1.15–1.40)
HCT: 34 % — ABNORMAL LOW (ref 36.0–46.0)
HCT: 35 % — ABNORMAL LOW (ref 36.0–46.0)
Hemoglobin: 11.6 g/dL — ABNORMAL LOW (ref 12.0–15.0)
Hemoglobin: 11.9 g/dL — ABNORMAL LOW (ref 12.0–15.0)
O2 Saturation: 48 %
O2 Saturation: 50 %
Potassium: 3.7 mmol/L (ref 3.5–5.1)
Potassium: 3.8 mmol/L (ref 3.5–5.1)
Sodium: 135 mmol/L (ref 135–145)
Sodium: 136 mmol/L (ref 135–145)
TCO2: 29 mmol/L (ref 22–32)
TCO2: 30 mmol/L (ref 22–32)
pCO2, Ven: 41.2 mmHg — ABNORMAL LOW (ref 44–60)
pCO2, Ven: 42.4 mmHg — ABNORMAL LOW (ref 44–60)
pH, Ven: 7.432 — ABNORMAL HIGH (ref 7.25–7.43)
pH, Ven: 7.444 — ABNORMAL HIGH (ref 7.25–7.43)
pO2, Ven: 25 mmHg — CL (ref 32–45)
pO2, Ven: 26 mmHg — CL (ref 32–45)

## 2024-10-24 LAB — GLUCOSE, CAPILLARY
Glucose-Capillary: 112 mg/dL — ABNORMAL HIGH (ref 70–99)
Glucose-Capillary: 97 mg/dL (ref 70–99)
Glucose-Capillary: 169 mg/dL — ABNORMAL HIGH (ref 70–99)
Glucose-Capillary: 182 mg/dL — ABNORMAL HIGH (ref 70–99)
Glucose-Capillary: 168 mg/dL — ABNORMAL HIGH (ref 70–99)

## 2024-10-24 LAB — BASIC METABOLIC PANEL WITH GFR
Anion gap: 14 (ref 5–15)
BUN: 12 mg/dL (ref 8–23)
CO2: 25 mmol/L (ref 22–32)
Calcium: 8.7 mg/dL — ABNORMAL LOW (ref 8.9–10.3)
Chloride: 99 mmol/L (ref 98–111)
Creatinine, Ser: 1.29 mg/dL — ABNORMAL HIGH (ref 0.44–1.00)
GFR, Estimated: 42 mL/min — ABNORMAL LOW
Glucose, Bld: 109 mg/dL — ABNORMAL HIGH (ref 70–99)
Potassium: 4 mmol/L (ref 3.5–5.1)
Sodium: 137 mmol/L (ref 135–145)

## 2024-10-24 MED ORDER — ONDANSETRON HCL 4 MG/2ML IJ SOLN: 4.0000 mg | Freq: Four times a day (QID) | INTRAMUSCULAR | Status: DC | PRN

## 2024-10-24 MED ORDER — ACETAMINOPHEN 325 MG PO TABS
650.0000 mg | ORAL_TABLET | ORAL | Status: DC | PRN
  Administered 2024-10-25: 650 mg via ORAL
  Filled 2024-10-24: qty 2

## 2024-10-24 MED ORDER — SODIUM CHLORIDE 0.9% FLUSH
3.0000 mL | Freq: Two times a day (BID) | INTRAVENOUS | Status: DC
  Administered 2024-10-24 – 2024-10-26 (×4): 3 mL via INTRAVENOUS

## 2024-10-24 MED ORDER — VERAPAMIL HCL 2.5 MG/ML IV SOLN
INTRAVENOUS | Status: AC
  Filled 2024-10-24: qty 2

## 2024-10-24 MED ORDER — FREE WATER
500.0000 mL | Freq: Once | Status: AC
  Administered 2024-10-24: 500 mL via ORAL

## 2024-10-24 MED ORDER — POTASSIUM CHLORIDE CRYS ER 20 MEQ PO TBCR
20.0000 meq | EXTENDED_RELEASE_TABLET | Freq: Once | ORAL | Status: AC
  Administered 2024-10-24: 20 meq via ORAL
  Filled 2024-10-24: qty 1

## 2024-10-24 MED ORDER — POTASSIUM CHLORIDE CRYS ER 20 MEQ PO TBCR: 40.0000 meq | EXTENDED_RELEASE_TABLET | Freq: Once | ORAL | Status: DC

## 2024-10-24 MED ORDER — APIXABAN 5 MG PO TABS
5.0000 mg | ORAL_TABLET | Freq: Two times a day (BID) | ORAL | Status: DC
  Administered 2024-10-24 – 2024-10-26 (×5): 5 mg via ORAL
  Filled 2024-10-24 (×5): qty 1

## 2024-10-24 MED ORDER — HEPARIN SODIUM (PORCINE) 1000 UNIT/ML IJ SOLN
INTRAMUSCULAR | Status: DC | PRN
  Administered 2024-10-24: 3000 [IU] via INTRAVENOUS

## 2024-10-24 MED ORDER — LIDOCAINE HCL (PF) 1 % IJ SOLN
INTRAMUSCULAR | Status: AC
  Filled 2024-10-24: qty 30

## 2024-10-24 MED ORDER — SODIUM CHLORIDE 0.9% FLUSH: 3.0000 mL | INTRAVENOUS | Status: DC | PRN

## 2024-10-24 MED ORDER — VERAPAMIL HCL 2.5 MG/ML IV SOLN
INTRAVENOUS | Status: DC | PRN
  Administered 2024-10-24: 10 mL via INTRA_ARTERIAL

## 2024-10-24 MED ORDER — ASPIRIN 81 MG PO CHEW
81.0000 mg | CHEWABLE_TABLET | ORAL | Status: AC
  Administered 2024-10-24: 81 mg via ORAL
  Filled 2024-10-24: qty 1

## 2024-10-24 MED ORDER — HEPARIN SODIUM (PORCINE) 1000 UNIT/ML IJ SOLN
INTRAMUSCULAR | Status: AC
  Filled 2024-10-24: qty 10

## 2024-10-24 MED ORDER — HYDRALAZINE HCL 20 MG/ML IJ SOLN: 10.0000 mg | INTRAMUSCULAR | Status: AC | PRN

## 2024-10-24 MED ORDER — APIXABAN 5 MG PO TABS
ORAL_TABLET | ORAL | Status: AC
  Filled 2024-10-24: qty 1

## 2024-10-24 MED ORDER — MILRINONE LACTATE IN DEXTROSE 20-5 MG/100ML-% IV SOLN
0.2500 ug/kg/min | INTRAVENOUS | Status: DC
  Administered 2024-10-24 – 2024-10-25 (×2): 0.25 ug/kg/min via INTRAVENOUS
  Filled 2024-10-24 (×2): qty 100

## 2024-10-24 MED ORDER — SODIUM CHLORIDE 0.9 % IV SOLN: 250.0000 mL | INTRAVENOUS | Status: AC | PRN

## 2024-10-24 MED ORDER — HEPARIN (PORCINE) IN NACL 2000-0.9 UNIT/L-% IV SOLN
INTRAVENOUS | Status: DC | PRN
  Administered 2024-10-24: 1000 mL

## 2024-10-24 MED ORDER — IOHEXOL 350 MG/ML SOLN
INTRAVENOUS | Status: DC | PRN
  Administered 2024-10-24: 40 mL

## 2024-10-24 MED ORDER — SODIUM CHLORIDE 0.9% FLUSH
10.0000 mL | Freq: Two times a day (BID) | INTRAVENOUS | Status: DC
  Administered 2024-10-24 – 2024-10-26 (×3): 10 mL

## 2024-10-24 MED ORDER — SODIUM CHLORIDE 0.9% FLUSH: 10.0000 mL | INTRAVENOUS | Status: DC | PRN

## 2024-10-24 MED ORDER — LIDOCAINE HCL (PF) 1 % IJ SOLN
INTRAMUSCULAR | Status: DC | PRN
  Administered 2024-10-24 (×2): 2 mL

## 2024-10-24 NOTE — H&P (View-Only) (Signed)
 "    Advanced Heart Failure Rounding Note  Cardiologist: Alvan Carrier, MD  [facility] Cardiologist: Dr. Cherrie Chief Complaint: Valvular disease and deconditioning Patient Profile   [name] is a 79 y.o. female with chronic diastolic heart failure, s/p prosthetic AVR and MVR in 2014, moderate COPD (per PFTs in 2014), paroxysmal atrial fibrillation, HTN, DM 2, HLD.   Subjective:    Still feels very weak. No CP or SOB  Objective:    Weight Range: 64.2 kg Body mass index is 25.89 kg/m.   Vital Signs:   Temp:  [97.7 F (36.5 C)-98.7 F (37.1 C)] 98 F (36.7 C) (01/22 0521) Pulse Rate:  [74-83] 74 (01/22 0521) Resp:  [18] 18 (01/22 0521) BP: (128-155)/(48-102) 138/70 (01/22 0521) SpO2:  [92 %-100 %] 100 % (01/22 0521) Weight:  [64.2 kg] 64.2 kg (01/22 0412) Last BM Date : 10/22/24  Weight change: Filed Weights   10/22/24 0500 10/23/24 0500 10/24/24 0412  Weight: 64.1 kg 65 kg 64.2 kg   Intake/Output:  Intake/Output Summary (Last 24 hours) at 10/24/2024 0752 Last data filed at 10/24/2024 0531 Gross per 24 hour  Intake 500 ml  Output 1250 ml  Net -750 ml    Physical Exam   General:  Elderly weak HEENT: normal Neck: supple. JVP 5-6 Cor: Regular rate & rhythm. 2/6 AS Lungs: clear Abdomen: soft, nontender, nondistended.Good bowel sounds. Extremities: no cyanosis, clubbing, rash, tredema Neuro: alert & orientedx3, cranial nerves grossly intact. moves all 4 extremities w/o difficulty. Affect pleasant   Labs   CBC Recent Labs    10/23/24 0500 10/24/24 0433  WBC 9.1 8.5  HGB 10.2* 10.7*  HCT 32.8* 34.7*  MCV 85.2 85.7  PLT 102* 112*   Basic Metabolic Panel Recent Labs    98/78/73 0500 10/24/24 0433  NA 136 137  K 3.5 4.0  CL 97* 99  CO2 27 25  GLUCOSE 92 109*  BUN 12 12  CREATININE 1.26* 1.29*  CALCIUM  8.6* 8.7*   BNP (last 3 results) Recent Labs    01/25/24 2031 03/14/24 1016 05/30/24 0727  BNP 775.0* 503.0* 528.0*   ProBNP (last 3  results) Recent Labs    07/24/24 0438 09/11/24 1824 10/08/24 2147  PROBNP 2,155.0* 5,666.0* 5,574.0*   Medications:    Scheduled Medications:  [MAR Hold] amiodarone   200 mg Oral Daily   [MAR Hold] atorvastatin   40 mg Oral Daily   [MAR Hold] budesonide -glycopyrrolate -formoterol   2 puff Inhalation BID   [MAR Hold] Chlorhexidine  Gluconate Cloth  6 each Topical Daily   [MAR Hold] feeding supplement  237 mL Oral BID BM   [MAR Hold] furosemide   40 mg Intravenous BID   [MAR Hold] Gerhardt's butt cream  1 Application Topical TID   [MAR Hold] HYDROcodone  bit-homatropine  5 mL Oral QHS   [MAR Hold] insulin  aspart  0-5 Units Subcutaneous QHS   [MAR Hold] insulin  aspart  0-9 Units Subcutaneous TID WC   [MAR Hold] levothyroxine   100 mcg Oral QAC breakfast   [MAR Hold] lidocaine  HCl (PF)  10 mL Intradermal Once   [MAR Hold] pantoprazole   40 mg Oral QAC breakfast   [MAR Hold] sodium chloride  flush  10-40 mL Intracatheter Q12H   [MAR Hold] sodium chloride  flush  10-40 mL Intracatheter Q12H   sodium chloride  flush  3 mL Intravenous Q12H    Infusions:  sodium chloride      heparin  850 Units/hr (10/23/24 1531)    PRN Medications: sodium chloride , [MAR Hold] azelastine , [MAR Hold]  diphenhydrAMINE , [MAR Hold] guaiFENesin -dextromethorphan , Heparin  (Porcine) in NaCl, [MAR Hold] hydrocortisone  cream, [MAR Hold] ipratropium-albuterol , [MAR Hold] meclizine , [MAR Hold] melatonin, [MAR Hold] menthol , [MAR Hold] phenol, [MAR Hold] polyethylene glycol, [MAR Hold] prochlorperazine , [MAR Hold] sodium chloride  flush, [MAR Hold] sodium chloride  flush, sodium chloride  flush  Assessment/Plan   Acute on Chronic HFpEF, RV Failure - EF 60-65% with normal RV in 10/25 - Echo 1/13 showed EF 45%, severe RV, and new deterioration of valves (compared to echo 10/25) - Volume status improved - For RHC today - no SGLT2i with frequent UTIs - no spiro with hyperkalemia  Rheumatic Heart Disease - s/p bioprosthetic AVR  and MVR in 2014 - echo with mod to severe stenosis of AVR (mG 21.3 mmHg) and MVR (mG 10 mmHg) - Cardiac CT shows that she would be a candidate for TMVR. Not candidate for TAVR with risk of obstruction.  - Given her overall fraility and debility, I do not think she is currently a candidate for TMVR as she would struggle to toelrate the procedure and would likely not benefit much from a symptomatic point of view.  - Given her and her husband's desire for aggressive care, I think the best plan would be to proceed with R/L cath to assess for low output which may be contributing to her debility. If she does not have low output would recommend transfer to SNF for rehab and we can re-evaluate candidacy for TMVR if/when she gets stronger. If low output, we can consider possible milrinone  to facilitate her rehab efforts.  - R/L cath this am    Atrial Fibrillation: Paroxysmal - Remains in NSR - continue amio 200 mg daily - on heparin  for cath   CKD 3b - baseline Cr 1.1-1.3 - Scr 1.29.  hold lasix  for now   Hyperkalemia - resolved  Deconditioning - continue working aggressively with PT/OT - Appreciate their input   Length of Stay: 16  Toribio Fuel, MD  10/24/2024, 7:52 AM  Advanced Heart Failure Team Pager [phone] (M-F; 7a - 5p)   Please visit [email or web address]: For overnight coverage please call cardiology fellow first. If fellow not available call Shock/ECMO MD on call.  For ECMO / Mechanical Support (Impella, IABP, LVAD) issues call Shock / ECMO MD on call.    "

## 2024-10-24 NOTE — Plan of Care (Signed)
 Admitted from cardiac cath lab. Milrinone  infusion continues. Family at bedside.   Problem: Education: Goal: Knowledge of General Education information will improve Description: Including pain rating scale, medication(s)/side effects and non-pharmacologic comfort measures Outcome: Progressing   Problem: Health Behavior/Discharge Planning: Goal: Ability to manage health-related needs will improve Outcome: Progressing   Problem: Clinical Measurements: Goal: Ability to maintain clinical measurements within normal limits will improve Outcome: Progressing Goal: Will remain free from infection Outcome: Progressing Goal: Diagnostic test results will improve Outcome: Progressing   Problem: Cardiovascular: Goal: Ability to achieve and maintain adequate cardiovascular perfusion will improve Outcome: Progressing Goal: Vascular access site(s) Level 0-1 will be maintained Outcome: Progressing   Problem: Health Behavior/Discharge Planning: Goal: Ability to safely manage health-related needs after discharge will improve Outcome: Progressing

## 2024-10-24 NOTE — Progress Notes (Signed)
 Milrinone  infusion initiated at this time.

## 2024-10-24 NOTE — Progress Notes (Signed)
 " Progress Note   Patient: [name] FMW:[id number] DOB: 1946/09/03 DOA: 10/08/2024     16 DOS: the patient was seen and examined on 10/24/2024   Brief hospital course: 79 y.o. female with medical history significant for paroxysmal A-fib on Eliquis , chronic HFpEF, prior CVA, carotid artery stenosis, valvular heart disease, CKD 3B, hypertension, hyperlipidemia, type 2 diabetes, hypothyroidism, COPD, bioprosthetic aortic and mitral valve 07/18/2013 presenting with shortness of breath that began on the evening of 10/09/2023.  She has noted some lower extremity edema.  She denies any chest pain, fever, chills, nausea, vomiting, diarrhea.  She has a nonproductive cough. In the ED, the patient was afebrile and hemodynamically stable with oxygen  saturation 87% room air.  She was placed on 2 L with saturation up to 96%. WBC 5.7, hemoglobin 10.7, platelets 143.  Sodium 133, potassium 3.8, bicarbonate 30, serum creatinine 1.55.  Chest x-ray showed hazy right lower lobe opacity.  There was mild vascular congestion.  The patient was started on IV furosemide .   Notably, the patient had a hospital admission from 09/11/2024 to 09/17/2024 during which time she was treated for COPD exacerbation and acute on chronic HFpEF.  She was discharged home with torsemide  20 mg daily.  IV furosemide  doses were gradually escalated as the patient remains fluid overloaded.  The patient gradually improved clinically.  However she remains deconditioned.  The patient continued to be sob although improving.  Repeat CXR on 1/13 showed Stable mild pulmonary edema and chronic bilateral interstitial opacities and increase in right small pleural effusion.  Lasix  dosing was increased, and thoracocentesis was requested  01/22 cardiac catheterization with severe mitral and aortic stenosis,elevated filling pressures, with low cardiac index, low output state.  Plan to start on milrinone    Assessment and Plan: * Acute on chronic diastolic  (congestive) heart failure (HCC) Echocardiogram with preserved LV systolic function with EF 50 to 55%, interventricular septum is flattened in systole, RV systolic function with severe reduction, RV cavity with severe enlargement, RVSP 50.3 mmHg, LA and RA with moderate dilatation, mitral valve replaced noted severe stenosis, aortic valve replaced with severe stenosis,   Acute on chronic cor pulmonale Pulmonary hypertension RV failure   01/22 cardiac catheterization  RA 10 RV 95/8 PA 96/26 mean 53  PCWP 17  Cardiac output 3,8 index 2.3 (fick) Cardiac output 2,4 index 1,5 (thermodilution)  PVR 9.5 WU (fick) 14.8 (TD)  Pre and post capillary pulmonary hypertension with low output state.   Urine output is 1250 ml Systolic blood pressure 130 mmHg range.   Continue diuresis with furosemide   Not candidate for valve surgical intervention  Plan for milrinone , palliative Poor prognosis   Essential hypertension Continue blood pressure monitoring   PAF (paroxysmal atrial fibrillation) (HCC) Continue amiodarone  Anticoagulation with apixaban   Continue telemetry   Type 2 diabetes mellitus with hyperlipidemia (HCC) Continue glucose cover and monitoring with insulin  sliding scale.  Continue with statin therapy   Asthma, cough variant COPD No signs of acute exacerbation Continue with bronchodilator therapy   Acquired hypothyroidism Continue levothyroxine    Gastroesophageal reflux disease Continue with pantoprazole          Subjective: Patient very weak and deconditioned, she has cardiac catheterization today   Physical Exam: Vitals:   10/24/24 1300 10/24/24 1315 10/24/24 1330 10/24/24 1439  BP: 92/72 (!) 141/127 (!) 146/77 119/66  Pulse: 73 72 76 73  Resp: 20 16 17 18   Temp:    98.1 F (36.7 C)  TempSrc:    Oral  SpO2: 98% 95% 95% 96%  Weight:    66 kg  Height:    5' 2 (1.575 m)   Neurology awake and alert, deconditioned and ill looking appearing  ENT with mild  pallor with no icterus Cardiovascular with S1 and S2 present and regular with no gallops or rubs, positive systolic murmur at the base Respiratory with bilateral rales with no wheezing, poor inspiratory effort Abdomen soft and non tender Lower extremities cold with no edema   Data Reviewed:    Family Communication: I spoke with patient's husband at the bedside, we talked in detail about patient's condition, plan of care and prognosis and all questions were addressed.    Disposition: Status is: Inpatient Remains inpatient appropriate because: IV milrinone    Planned Discharge Destination: Skilled nursing facility     Author: Elidia Toribio Furnace, MD 10/24/2024 3:36 PM  For on call review [email or web address].  "

## 2024-10-24 NOTE — Progress Notes (Signed)
 "    Advanced Heart Failure Rounding Note  Cardiologist: Alvan Carrier, MD  [facility] Cardiologist: Dr. Cherrie Chief Complaint: Valvular disease and deconditioning Patient Profile   [name] is a 79 y.o. female with chronic diastolic heart failure, s/p prosthetic AVR and MVR in 2014, moderate COPD (per PFTs in 2014), paroxysmal atrial fibrillation, HTN, DM 2, HLD.   Subjective:    Still feels very weak. No CP or SOB  Objective:    Weight Range: 64.2 kg Body mass index is 25.89 kg/m.   Vital Signs:   Temp:  [97.7 F (36.5 C)-98.7 F (37.1 C)] 98 F (36.7 C) (01/22 0521) Pulse Rate:  [74-83] 74 (01/22 0521) Resp:  [18] 18 (01/22 0521) BP: (128-155)/(48-102) 138/70 (01/22 0521) SpO2:  [92 %-100 %] 100 % (01/22 0521) Weight:  [64.2 kg] 64.2 kg (01/22 0412) Last BM Date : 10/22/24  Weight change: Filed Weights   10/22/24 0500 10/23/24 0500 10/24/24 0412  Weight: 64.1 kg 65 kg 64.2 kg   Intake/Output:  Intake/Output Summary (Last 24 hours) at 10/24/2024 0752 Last data filed at 10/24/2024 0531 Gross per 24 hour  Intake 500 ml  Output 1250 ml  Net -750 ml    Physical Exam   General:  Elderly weak HEENT: normal Neck: supple. JVP 5-6 Cor: Regular rate & rhythm. 2/6 AS Lungs: clear Abdomen: soft, nontender, nondistended.Good bowel sounds. Extremities: no cyanosis, clubbing, rash, tredema Neuro: alert & orientedx3, cranial nerves grossly intact. moves all 4 extremities w/o difficulty. Affect pleasant   Labs   CBC Recent Labs    10/23/24 0500 10/24/24 0433  WBC 9.1 8.5  HGB 10.2* 10.7*  HCT 32.8* 34.7*  MCV 85.2 85.7  PLT 102* 112*   Basic Metabolic Panel Recent Labs    98/78/73 0500 10/24/24 0433  NA 136 137  K 3.5 4.0  CL 97* 99  CO2 27 25  GLUCOSE 92 109*  BUN 12 12  CREATININE 1.26* 1.29*  CALCIUM  8.6* 8.7*   BNP (last 3 results) Recent Labs    01/25/24 2031 03/14/24 1016 05/30/24 0727  BNP 775.0* 503.0* 528.0*   ProBNP (last 3  results) Recent Labs    07/24/24 0438 09/11/24 1824 10/08/24 2147  PROBNP 2,155.0* 5,666.0* 5,574.0*   Medications:    Scheduled Medications:  [MAR Hold] amiodarone   200 mg Oral Daily   [MAR Hold] atorvastatin   40 mg Oral Daily   [MAR Hold] budesonide -glycopyrrolate -formoterol   2 puff Inhalation BID   [MAR Hold] Chlorhexidine  Gluconate Cloth  6 each Topical Daily   [MAR Hold] feeding supplement  237 mL Oral BID BM   [MAR Hold] furosemide   40 mg Intravenous BID   [MAR Hold] Gerhardt's butt cream  1 Application Topical TID   [MAR Hold] HYDROcodone  bit-homatropine  5 mL Oral QHS   [MAR Hold] insulin  aspart  0-5 Units Subcutaneous QHS   [MAR Hold] insulin  aspart  0-9 Units Subcutaneous TID WC   [MAR Hold] levothyroxine   100 mcg Oral QAC breakfast   [MAR Hold] lidocaine  HCl (PF)  10 mL Intradermal Once   [MAR Hold] pantoprazole   40 mg Oral QAC breakfast   [MAR Hold] sodium chloride  flush  10-40 mL Intracatheter Q12H   [MAR Hold] sodium chloride  flush  10-40 mL Intracatheter Q12H   sodium chloride  flush  3 mL Intravenous Q12H    Infusions:  sodium chloride      heparin  850 Units/hr (10/23/24 1531)    PRN Medications: sodium chloride , [MAR Hold] azelastine , [MAR Hold]  diphenhydrAMINE , [MAR Hold] guaiFENesin -dextromethorphan , Heparin  (Porcine) in NaCl, [MAR Hold] hydrocortisone  cream, [MAR Hold] ipratropium-albuterol , [MAR Hold] meclizine , [MAR Hold] melatonin, [MAR Hold] menthol , [MAR Hold] phenol, [MAR Hold] polyethylene glycol, [MAR Hold] prochlorperazine , [MAR Hold] sodium chloride  flush, [MAR Hold] sodium chloride  flush, sodium chloride  flush  Assessment/Plan   Acute on Chronic HFpEF, RV Failure - EF 60-65% with normal RV in 10/25 - Echo 1/13 showed EF 45%, severe RV, and new deterioration of valves (compared to echo 10/25) - Volume status improved - For RHC today - no SGLT2i with frequent UTIs - no spiro with hyperkalemia  Rheumatic Heart Disease - s/p bioprosthetic AVR  and MVR in 2014 - echo with mod to severe stenosis of AVR (mG 21.3 mmHg) and MVR (mG 10 mmHg) - Cardiac CT shows that she would be a candidate for TMVR. Not candidate for TAVR with risk of obstruction.  - Given her overall fraility and debility, I do not think she is currently a candidate for TMVR as she would struggle to toelrate the procedure and would likely not benefit much from a symptomatic point of view.  - Given her and her husband's desire for aggressive care, I think the best plan would be to proceed with R/L cath to assess for low output which may be contributing to her debility. If she does not have low output would recommend transfer to SNF for rehab and we can re-evaluate candidacy for TMVR if/when she gets stronger. If low output, we can consider possible milrinone  to facilitate her rehab efforts.  - R/L cath this am    Atrial Fibrillation: Paroxysmal - Remains in NSR - continue amio 200 mg daily - on heparin  for cath   CKD 3b - baseline Cr 1.1-1.3 - Scr 1.29.  hold lasix  for now   Hyperkalemia - resolved  Deconditioning - continue working aggressively with PT/OT - Appreciate their input   Length of Stay: 16  Toribio Fuel, MD  10/24/2024, 7:52 AM  Advanced Heart Failure Team Pager [phone] (M-F; 7a - 5p)   Please visit [email or web address]: For overnight coverage please call cardiology fellow first. If fellow not available call Shock/ECMO MD on call.  For ECMO / Mechanical Support (Impella, IABP, LVAD) issues call Shock / ECMO MD on call.    "

## 2024-10-24 NOTE — Interval H&P Note (Signed)
 History and Physical Interval Note:  10/24/2024 7:57 AM  [name]  has presented today for surgery, with the diagnosis of heart failure.  The various methods of treatment have been discussed with the patient and family. After consideration of risks, benefits and other options for treatment, the patient has consented to  Procedures: RIGHT/LEFT HEART CATH AND CORONARY ANGIOGRAPHY (N/A) as a surgical intervention.  The patient's history has been reviewed, patient examined, no change in status, stable for surgery.  I have reviewed the patient's chart and labs.  Questions were answered to the patient's satisfaction.     Leandro Berkowitz

## 2024-10-24 NOTE — Progress Notes (Signed)
 Admitted from Cath Lab at this time.  Vitals obtained and placed on cardiac monitor.     10/24/24 1439  Vitals  Temp 98.1 F (36.7 C)  Temp Source Oral  BP 119/66  MAP (mmHg) 76  BP Location Left Wrist  BP Method Automatic  Patient Position (if appropriate) Lying  Pulse Rate 73  Pulse Rate Source Monitor  ECG Heart Rate 72  Resp 18  Level of Consciousness  Level of Consciousness Alert  MEWS COLOR  MEWS Score Color Green  Oxygen  Therapy  SpO2 96 %  O2 Device Nasal Cannula  O2 Flow Rate (L/min) 2 L/min  Height and Weight  Height 5' 2 (1.575 m)  Weight 66 kg  Type of Scale Used Bed  Type of Weight Actual  BSA (Calculated - sq m) 1.7 sq meters  BMI (Calculated) 26.61  Weight in (lb) to have BMI = 25 136.4  ECG Monitoring  Telemetry Box Number [id number] M02  Tele Box Verification Completed by Second Verifier Completed Sherolyn RN)  MEWS Score  MEWS Temp 0  MEWS Systolic 0  MEWS Pulse 0  MEWS RR 0  MEWS LOC 0  MEWS Score 0

## 2024-10-24 NOTE — Progress Notes (Signed)
 This chaplain responded to PMT NP-Eric consult for spiritual care. This chaplain reviewed the Pt. chart notes and received an update from [facility]. The chaplain understands the Pt. is presenting for surgery today.   The chaplain attempted to offer support for the Pt. husband-Robert. The chaplain is unable to determine Robert's location. This chaplain will F/U as appropriate.  Chaplain Leeroy Hummer [phone]

## 2024-10-24 NOTE — Plan of Care (Signed)
   Problem: Education: Goal: Knowledge of General Education information will improve Description Including pain rating scale, medication(s)/side effects and non-pharmacologic comfort measures Outcome: Progressing   Problem: Health Behavior/Discharge Planning: Goal: Ability to manage health-related needs will improve Outcome: Progressing

## 2024-10-24 NOTE — Progress Notes (Signed)
 Patient pulled out midline.  Heparin  was moved over to PIV until IV team could replace.  Pharmacy notified.

## 2024-10-24 NOTE — Progress Notes (Signed)

## 2024-10-25 ENCOUNTER — Other Ambulatory Visit: Payer: Self-pay

## 2024-10-25 DIAGNOSIS — J45991 Cough variant asthma: Secondary | ICD-10-CM | POA: Diagnosis not present

## 2024-10-25 DIAGNOSIS — I10 Essential (primary) hypertension: Secondary | ICD-10-CM | POA: Diagnosis not present

## 2024-10-25 DIAGNOSIS — E1169 Type 2 diabetes mellitus with other specified complication: Secondary | ICD-10-CM | POA: Diagnosis not present

## 2024-10-25 DIAGNOSIS — E785 Hyperlipidemia, unspecified: Secondary | ICD-10-CM | POA: Diagnosis not present

## 2024-10-25 DIAGNOSIS — Z515 Encounter for palliative care: Secondary | ICD-10-CM | POA: Diagnosis not present

## 2024-10-25 DIAGNOSIS — K219 Gastro-esophageal reflux disease without esophagitis: Secondary | ICD-10-CM | POA: Diagnosis not present

## 2024-10-25 DIAGNOSIS — I480 Paroxysmal atrial fibrillation: Secondary | ICD-10-CM | POA: Diagnosis not present

## 2024-10-25 DIAGNOSIS — E039 Hypothyroidism, unspecified: Secondary | ICD-10-CM | POA: Diagnosis not present

## 2024-10-25 DIAGNOSIS — I5033 Acute on chronic diastolic (congestive) heart failure: Secondary | ICD-10-CM | POA: Diagnosis not present

## 2024-10-25 DIAGNOSIS — N1832 Chronic kidney disease, stage 3b: Secondary | ICD-10-CM | POA: Diagnosis not present

## 2024-10-25 DIAGNOSIS — I110 Hypertensive heart disease with heart failure: Secondary | ICD-10-CM | POA: Diagnosis not present

## 2024-10-25 LAB — BASIC METABOLIC PANEL WITH GFR
Anion gap: 14 (ref 5–15)
BUN: 11 mg/dL (ref 8–23)
CO2: 21 mmol/L — ABNORMAL LOW (ref 22–32)
Calcium: 9.2 mg/dL (ref 8.9–10.3)
Chloride: 100 mmol/L (ref 98–111)
Creatinine, Ser: 1.27 mg/dL — ABNORMAL HIGH (ref 0.44–1.00)
GFR, Estimated: 43 mL/min — ABNORMAL LOW
Glucose, Bld: 111 mg/dL — ABNORMAL HIGH (ref 70–99)
Potassium: 4.1 mmol/L (ref 3.5–5.1)
Sodium: 135 mmol/L (ref 135–145)

## 2024-10-25 LAB — GLUCOSE, CAPILLARY
Glucose-Capillary: 157 mg/dL — ABNORMAL HIGH (ref 70–99)
Glucose-Capillary: 158 mg/dL — ABNORMAL HIGH (ref 70–99)
Glucose-Capillary: 126 mg/dL — ABNORMAL HIGH (ref 70–99)
Glucose-Capillary: 131 mg/dL — ABNORMAL HIGH (ref 70–99)

## 2024-10-25 LAB — COOXEMETRY PANEL
Carboxyhemoglobin: 2.1 % — ABNORMAL HIGH (ref 0.5–1.5)
Methemoglobin: 1.1 % (ref 0.0–1.5)
O2 Saturation: 64.2 %
Total hemoglobin: 10.5 g/dL — ABNORMAL LOW (ref 12.0–16.0)

## 2024-10-25 LAB — CBC
HCT: 39.7 % (ref 36.0–46.0)
Hemoglobin: 12 g/dL (ref 12.0–15.0)
MCH: 26.8 pg (ref 26.0–34.0)
MCHC: 30.2 g/dL (ref 30.0–36.0)
MCV: 88.6 fL (ref 80.0–100.0)
Platelets: 115 K/uL — ABNORMAL LOW (ref 150–400)
RBC: 4.48 MIL/uL (ref 3.87–5.11)
RDW: 17.5 % — ABNORMAL HIGH (ref 11.5–15.5)
WBC: 9.9 K/uL (ref 4.0–10.5)
nRBC: 0.2 % (ref 0.0–0.2)

## 2024-10-25 LAB — APTT: aPTT: 31 s (ref 24–36)

## 2024-10-25 LAB — MAGNESIUM: Magnesium: 2 mg/dL (ref 1.7–2.4)

## 2024-10-25 MED ORDER — HYDROCODONE BIT-HOMATROP MBR 5-1.5 MG/5ML PO SOLN
5.0000 mL | Freq: Four times a day (QID) | ORAL | Status: DC | PRN
  Administered 2024-10-25: 5 mL via ORAL
  Filled 2024-10-25: qty 5

## 2024-10-25 MED ORDER — TORSEMIDE 20 MG PO TABS
20.0000 mg | ORAL_TABLET | Freq: Every day | ORAL | Status: DC
  Administered 2024-10-25 – 2024-10-26 (×2): 20 mg via ORAL
  Filled 2024-10-25 (×2): qty 1

## 2024-10-25 NOTE — Plan of Care (Signed)
" °  Problem: Activity: Goal: Risk for activity intolerance will decrease Outcome: Progressing   Problem: Clinical Measurements: Goal: Diagnostic test results will improve Outcome: Not Progressing Goal: Respiratory complications will improve Outcome: Not Progressing Goal: Cardiovascular complication will be avoided Outcome: Not Progressing   "

## 2024-10-25 NOTE — Care Management Important Message (Signed)
 Important Message  Patient Details  Name: [name] MRN: [id number] Date of Birth: 08-03-1946   Important Message Given:  Yes - Medicare IM     Vonzell Arrie Sharps 10/25/2024, 12:08 PM

## 2024-10-25 NOTE — Progress Notes (Signed)
 " Daily Progress Note   Date: 10/25/2024   Patient Name: [name]  DOB: 08/24/1946  MRN: [id number]  Age / Sex: 79 y.o., female  Attending Physician: Noralee Elidia Sieving,* Primary Care Physician: Sheryle Carwin, MD Admit Date: 10/08/2024 Length of Stay: 17 days  Reason for Follow-up: Establishing goals of care  Past Medical History:  Diagnosis Date   Anemia    Asthma 2010   Atrial fibrillation [facility])    Atrial flutter (HCC)    Breast carcinoma (HCC) 1995   1995   Cardioembolic stroke (HCC) 01/2012   Right frontal in 01/2012; normal carotid ultrasound; possible LAA thrombus by TEE; virtual complete neurologic recovery   Carotid artery stenosis 1961   Chronic kidney disease, stage 2, mildly decreased GFR    GFR of approximately 60   Diabetes mellitus without complication (HCC) 5+ years ago   Controlled most of time   Diverticulosis of colon (without mention of hemorrhage) 2012   Dr. Golda   Fasting hyperglycemia    120 fasting   Gastroesophageal reflux disease    Gastroparesis    Heart failure with improved ejection fraction (HFimpEF) (HCC) 5+ years   Heart murmur 1961   Hemorrhoids    History of aortic valve replacement with bioprosthetic valve    History of mitral valve replacement with bioprosthetic valve    Hyperlipidemia    Hypertension    pt denies 05/30/13     Dr Alvan chester   Hyponatremia    Hypothyroidism    Rheumatic heart disease    Shortness of breath    Syncope and collapse February, 2024    Subjective:   Subjective: Chart Reviewed. Updates received. Patient Assessed. Created space and opportunity for patient  and family to explore thoughts and feelings regarding current medical situation.  Today's Discussion: Today before meeting with the patient/family, I reviewed the chart notes including cardiology note from yesterday, L/RHC note from yesterday, chaplain note from yesterday, nursing note from yesterday, internal medicine note from yesterday,  cardiology note from today, PT/OT note from today. I also reviewed vital signs, nursing flowsheets, medication administrations record, labs, and imaging.  Today saw the patient at bedside, her husband [name] was also present.  We spent time reviewing the heart cath results and what cardiology has told them.  They know that her valves are bad and that her heart is weak.  They started milrinone  to see if we could help her get better/stronger to be able to tolerate rehab to see if she can get strong enough to be a candidate for valve procedure.  They know that right now she is not a candidate for valve procedure.  Her husband states that she is not really feeling much better today even on the milrinone .  He shares that Dr. Bensimhon was not surprised by this.  We spent time talking about how to proceed given that she is unlikely to become a candidate for valve procedure.  Her husband states that he does not think there is any point in trying to go to rehab and putting her through that if it is not can to help.  He shares that the cardiology team does not think it will help either.  At this point he says they are leaning towards just going home.  We decided to expand on that a little bit.  As we were starting to talk about options moving forward, the patient began having a lot of coughing and choking.  She vomited a  small amount.  I requested the nurse to bring in a dose of Compazine  and assisted in helping to clean her up.  After this we talked about options moving forward if we cannot replace and fix her valve.  I shared that her heart would continue to decompensate.  In the setting of not wanting to put her through any more aggressive care, we have the option to transition to comfort and hospice care. I described hospice as a service for patients who have a life expectancy of 6 months or less. The goal of hospice is the preservation of dignity and quality at the end phases of life. Under hospice care, the  focus changes from curative to symptom relief. I explained the three setting where hospice services can be provided including the home, at a living facility (such as LTC SNF, Assisted Living, etc), and a hospice facility. I explained that acceptance to hospice in any specific location is the final decision of the hospice medical director and bed availability, if applicable. They verbalized understanding.  After our discussion, they are interested in speaking with hospice to see what services could be offered.  I offered choice and they requested [facility]) due to proximity to their home.  I shared that I would follow-up tomorrow to check on her and see how she is doing.  I shared that I would put in a [facility] consult for referral for hospice who can hopefully reach out to them today or tomorrow for ongoing discussions.  I encouraged them to call our team for any questions or concerns while she is admitted.  I provided emotional and general support through therapeutic listening, empathy, sharing of stories, and other techniques. I answered all questions and addressed all concerns to the best of my ability.  After seeing the patient I debriefed with the medical team about patient and family's desire to explore hospice as a discharge disposition.  Review of Systems  Constitutional:  Positive for fatigue.  Respiratory:  Negative for shortness of breath.   Cardiovascular:  Negative for chest pain.  Gastrointestinal:  Positive for nausea and vomiting. Negative for abdominal pain.  Neurological:  Positive for weakness.    Objective:   Primary Diagnoses: Present on Admission:  Essential hypertension  Acquired hypothyroidism  Gastroesophageal reflux disease  Asthma, cough variant  Acute on chronic diastolic (congestive) heart failure (HCC)   Vital Signs:  BP 112/62 (BP Location: Left Wrist)   Pulse 83   Temp 98 F (36.7 C) (Oral)   Resp 19   Ht 5' 2 (1.575 m)   Wt 66.6 kg    SpO2 98%   BMI 26.85 kg/m   Physical Exam Vitals and nursing note reviewed.  Constitutional:      General: She is not in acute distress.    Appearance: She is ill-appearing.     Comments: Appears very frail and weak  HENT:     Head: Normocephalic and atraumatic.  Cardiovascular:     Rate and Rhythm: Normal rate.  Pulmonary:     Effort: Pulmonary effort is normal. No respiratory distress.     Comments: Intermittent cough Abdominal:     General: Abdomen is flat. There is no distension.     Palpations: Abdomen is soft.  Skin:    General: Skin is warm and dry.  Neurological:     General: No focal deficit present.     Mental Status: She is alert and oriented to person, place, and time.  Psychiatric:  Mood and Affect: Mood normal.        Behavior: Behavior normal.     Palliative Assessment/Data: 20-30%   Existing Vynca/ACP Documentation: DO NOT RESUSCITATE effective 07/22/2024  Assessment & Plan:   HPI/Patient Profile:  79 y.o. female  with past medical history of paroxysmal A-fib on Eliquis , chronic HFpEF, prior CVA, carotid artery stenosis, valvular heart disease, CKD 3B, hypertension, hyperlipidemia, type 2 diabetes, hypothyroidism, COPD, bioprosthetic aortic and mitral valve 07/18/2013 presenting with shortness of breath that began on the evening of 10/09/2023.  After emergency department workup she was admitted on 10/08/2024 with acute on chronic HFpEF with RV failure, rheumatic heart disease, paroxysmal A-fib, CKD 3B, significant deconditioning, and others.    Palliative medicine was consulted for goals of care conversations and assistance with complex decision making.  SUMMARY OF RECOMMENDATIONS   DNR decimated There are no longer interested in attempting rehab Patient and family interested in hospice services at discharge [facility] consult placed for referral to [facility] Continue current scope for now with goal of being able to discharge home versus hospice facility  pending discussion with hospice Palliative medicine will continue to follow  Symptom Management:  Per primary team Palliative medicine is available to assist as needed  Code Status: DNR - Limited (DNR/DNI)  Prognosis: < 4 weeks  Discharge Planning: Hospice home vs/ hospice facility  Discussed with: Patient, family, medical team, nursing team, [facility]  Thank you for allowing us  to participate in the care of AMNEET CENDEJAS PMT will continue to support holistically.  Time Total: 75 min  Detailed review of medical records (labs, imaging, vital signs), medically appropriate exam, discussed with treatment team, counseling and education to patient, family, & staff, documenting clinical information, medication management, coordination of care  Camellia Kays, NP Palliative Medicine Team  Team Phone # [phone] (Nights/Weekends)  06/01/2021, 8:17 AM  "

## 2024-10-25 NOTE — Progress Notes (Addendum)
 "    Advanced Heart Failure Rounding Note  Cardiologist: Alvan Carrier, MD  AHF Cardiologist: Dr. Cherrie Chief Complaint: Valvular disease and deconditioning Patient Profile   [name] is a 79 y.o. female with chronic diastolic heart failure, s/p prosthetic AVR and MVR in 2014, moderate COPD (per PFTs in 2014), paroxysmal atrial fibrillation, HTN, DM 2, HLD.   Interval History:    1/22: R/LHC mild non-obstructive CAD (D1 70%, RCA 20%); RA 10, PA 96/26 (53), PCW, TD CO/CI 2.4/1.5, PVR 14.8  Subjective:    Does not feel any different on the milrinone . Remains very week and tired. Flat affect.   Objective:    Weight Range: 66.6 kg Body mass index is 26.85 kg/m.   Vital Signs:   Temp:  [97.9 F (36.6 C)-98.3 F (36.8 C)] 98.3 F (36.8 C) (01/23 0353) Pulse Rate:  [66-99] 72 (01/23 0353) Resp:  [12-23] 21 (01/23 0353) BP: (74-156)/(35-134) 129/104 (01/23 0353) SpO2:  [89 %-100 %] 94 % (01/23 0353) Weight:  [66 kg-66.6 kg] 66.6 kg (01/23 0353) Last BM Date : 10/25/24  Weight change: Filed Weights   10/24/24 0412 10/24/24 1439 10/25/24 0353  Weight: 64.2 kg 66 kg 66.6 kg   Intake/Output:  Intake/Output Summary (Last 24 hours) at 10/25/2024 0704 Last data filed at 10/25/2024 0355 Gross per 24 hour  Intake 19.45 ml  Output 1475 ml  Net -1455.55 ml    Physical Exam   General: Frail appearing.  Cardiac: JVP flat. No murmurs  Extremities: Warm and dry.  No edema.  Neuro: A&O x3. Affect flat  Labs   CBC Recent Labs    10/23/24 0500 10/24/24 0433 10/24/24 0811 10/24/24 0817  WBC 9.1 8.5  --   --   HGB 10.2* 10.7* 11.9*  11.6* 9.5*  HCT 32.8* 34.7* 35.0*  34.0* 28.0*  MCV 85.2 85.7  --   --   PLT 102* 112*  --   --    Basic Metabolic Panel Recent Labs    98/78/73 0500 10/24/24 0433 10/24/24 0811 10/24/24 0817  NA 136 137 135  136 143  K 3.5 4.0 3.8  3.7 2.9*  CL 97* 99  --   --   CO2 27 25  --   --   GLUCOSE 92 109*  --   --   BUN 12  12  --   --   CREATININE 1.26* 1.29*  --   --   CALCIUM  8.6* 8.7*  --   --    BNP (last 3 results) Recent Labs    01/25/24 2031 03/14/24 1016 05/30/24 0727  BNP 775.0* 503.0* 528.0*   ProBNP (last 3 results) Recent Labs    07/24/24 0438 09/11/24 1824 10/08/24 2147  PROBNP 2,155.0* 5,666.0* 5,574.0*   Medications:    Scheduled Medications:  amiodarone   200 mg Oral Daily   apixaban   5 mg Oral BID   atorvastatin   40 mg Oral Daily   budesonide -glycopyrrolate -formoterol   2 puff Inhalation BID   Chlorhexidine  Gluconate Cloth  6 each Topical Daily   feeding supplement  237 mL Oral BID BM   furosemide   40 mg Intravenous BID   Gerhardt's butt cream  1 Application Topical TID   HYDROcodone  bit-homatropine  5 mL Oral QHS   insulin  aspart  0-5 Units Subcutaneous QHS   insulin  aspart  0-9 Units Subcutaneous TID WC   levothyroxine   100 mcg Oral QAC breakfast   lidocaine  HCl (PF)  10 mL Intradermal Once  pantoprazole   40 mg Oral QAC breakfast   sodium chloride  flush  10-40 mL Intracatheter Q12H   sodium chloride  flush  10-40 mL Intracatheter Q12H   sodium chloride  flush  3 mL Intravenous Q12H    Infusions:  sodium chloride      milrinone  0.25 mcg/kg/min (10/25/24 0628)    PRN Medications: sodium chloride , acetaminophen , azelastine , diphenhydrAMINE , guaiFENesin -dextromethorphan , hydrocortisone  cream, ipratropium-albuterol , meclizine , melatonin, menthol , phenol, polyethylene glycol, prochlorperazine , sodium chloride  flush, sodium chloride  flush, sodium chloride  flush  Assessment/Plan   Acute on Chronic HFpEF, Severe PAH with Cor Pulmonale  - EF 60-65% with normal RV in 10/25 - Echo 1/13 showed EF 45%, severe RV, and new deterioration of valves (compared to echo 10/25) - RHC 1/22: EF 55. Severe PAH with high PVR & nl PAPi; RA 10, PA 96/26 (53), PCW 17, TD CO/CI 2.4/1.5, PVR 14.8 - Started on milrinone  0.25 mcg/kg/min; continue - has central line, will plan for home milrinone  -  volume ok - restart torsemide  20 mg daily - no SGLT2i with frequent UTIs - no spiro with hyperkalemia - very difficult situation with frailty/deconditioning and severe PAH, possibly fixed and low output in the setting of severe prosthetic MV stenois and mod/severe AoV stenosis. Currently not a candidate for TMVR. Milrinone  to support rehab. If clinically improved, will repeat RHC in several weeks to reassess candidacy. - continue palliative care support  Rheumatic Heart Disease - s/p bioprosthetic AVR and MVR in 2014 - echo with mod to severe stenosis of AVR (mG 21.3 mmHg) and MVR (mG 10 mmHg) - Cardiac CT shows that she would be a candidate for TMVR. Not candidate for TAVR with risk of obstruction.  - cath 1/22 showed severe prosthetic MV stenosis and mod/severe prosthetic AoV stenosis. AV gradient on pullback mean 16.73mmHG calculated AVA 1.09cm2, MV gradient on simultaneous LV/wedge tracings mean 11.7mmHG MVA 0.57cm2 - plan as able  Atrial Fibrillation: Paroxysmal - Remains in NSR - continue amio 200 mg daily - continue eliquis  5 mg bid (CTM wt)   CKD 3b - baseline Cr 1.1-1.3   Hyperkalemia - resolved  Deconditioning - continue working aggressively with PT/OT - Appreciate their input  Notified SW of need for SNF that will take milrinone .    Length of Stay: 42  Jordan Lee, NP  10/25/2024, 7:04 AM  Advanced Heart Failure Team Pager [phone] (M-F; 7a - 5p)   Please visit [email or web address]: For overnight coverage please call cardiology fellow first. If fellow not available call Shock/ECMO MD on call.  For ECMO / Mechanical Support (Impella, IABP, LVAD) issues call Shock / ECMO MD on call.   Patient seen and examined with the above-signed Advanced Practice Provider and/or Housestaff. I personally reviewed laboratory data, imaging studies and relevant notes. I independently examined the patient and formulated the important aspects of the plan. I have edited the note to reflect any of my  changes or salient points. I have personally discussed the plan with the patient and/or family.  Milrinone  started yesterday. Very lethargic. Will arouse and then fall back to sleep.   Told me yesterday that she felt that she was in prison and just wanted to go home.  General:  Lying in bed. Lethargic HEENT: normal Neck: supple. JVP to ear Cor: Regular rate & rhythm. 2/6 SEM Lungs: clear Abdomen: soft, nontender, nondistended.Good bowel sounds. Extremities: no cyanosis, clubbing, rash, tr edema Neuro: lethargic but arousable  Cath results reviewed with her husband. She haas end-stage physiology with severe, likely fixed PAH in setting  of severe prosthetic valvular disease. Course also c/b profound physical debility. She is getting worse by the day and likely actively dying.   I do not think she will ever be a candidate for TMVR or any other procedures and even if she could get through the procedure, I doubt it would benefit her.  I will stop milrinone . Have recommended Hospice care.   Palliative Care seeing.   AHF team will sign off.  Toribio Fuel, MD  2:27 PM    "

## 2024-10-25 NOTE — Progress Notes (Signed)
 PT Cancellation Note  Patient Details Name: [name] MRN: [id number] DOB: [birth year]   Cancelled Treatment:    Reason Eval/Treat Not Completed: Other (comment) RN reported pt has been nauseous with an episode of emesis. Palliative also currently at bedside. Will hold and follow up as schedule allows.  Kate ORN, PT, DPT Secure Chat Preferred  Rehab Office [phone]   Kate BRAVO [name] 10/25/2024, 1:32 PM

## 2024-10-25 NOTE — Progress Notes (Signed)
 Physical Therapy Treatment Patient Details Name: [name] MRN: [id number] DOB: April 05, 1946 Today's Date: 10/25/2024   History of Present Illness 79 y.o. female admitted 10/08/24 with worsening SOB, BLE edema. Workup for acute hypoxemic respiratory failure secondary to acute on chronic HFpEF, new severe RV dysfunction, E. Coli UTI. Repeat echo 1/15 with mod-severe prosthetic MS and AS. 1/22 R/L Heart cath. PMH includes PAF on Eliquis , HFpEF, CVA, carotid artery stenosis, CKD, HTN, HLD, DM2, COPD, lumbar sx, mild cognitive impairment, s/p bioprosthetic aortic and mitral valve (2014), fall with ICH/IVH (06/2024).   PT Comments  Pt received in supine with husband present and agreeable to PT session. Pt reported feeling fatigued with difficulty clearing secretions. She was agreeable to transfer to the recliner with increased time and effort throughout. Required ModA for bed mobility and CGA for seated balance once on the EOB. Able to perform step-pivot transfer to the recliner with MinA via 1HH. Once seated in the recliner, pt had difficulty maintaining upright posture with a heavy left lateral lean. +2 assist needed to reposition in the recliner. Pt/family discussing possible return home with hospice. Will continue to follow as appropriate by goals of care.     If plan is discharge home, recommend the following: A lot of help with walking and/or transfers;A lot of help with bathing/dressing/bathroom;Assistance with cooking/housework;Assist for transportation;Help with stairs or ramp for entrance   Can travel by private vehicle     No  Equipment Recommendations  None recommended by PT       Precautions / Restrictions Precautions Precautions: Fall;Other (comment) Recall of Precautions/Restrictions: Impaired Precaution/Restrictions Comments: watch SpO2 (does not wear baseline); urine incontinence Restrictions Weight Bearing Restrictions Per Provider Order: No     Mobility  Bed Mobility Overal  bed mobility: Needs Assistance Bed Mobility: Supine to Sit    Supine to sit: Mod assist    General bed mobility comments: able to bring BLE's towards EOB with assist needed to complete movement. Assist also needed to raise trunk and shift hips forward towards EOB    Transfers Overall transfer level: Needs assistance Equipment used: 1 person hand held assist Transfers: Sit to/from Stand, Bed to chair/wheelchair/BSC Sit to Stand: Min assist   Step pivot transfers: Min assist    General transfer comment: MinA to perform step-pivot with 1HH per pt preference. Assist needed to scoot posteriorly once in the chair with +2 and use of bed pad       Balance Overall balance assessment: Needs assistance Sitting-balance support: Bilateral upper extremity supported, Feet supported Sitting balance-Leahy Scale: Poor Sitting balance - Comments: CGA once seated on EOB. ModA after transferring to the recliner due to heavy left lateral lean Postural control: Left lateral lean Standing balance support: Single extremity supported, During functional activity Standing balance-Leahy Scale: Poor Standing balance comment: reliant on 1UE support     Communication Communication Communication: No apparent difficulties  Cognition Arousal: Alert Behavior During Therapy: Flat affect   PT - Cognitive impairments: History of cognitive impairments, Awareness, Problem solving, Memory    PT - Cognition Comments: per chart, h/o mild cognitive impairment Following commands: Impaired Following commands impaired: Only follows one step commands consistently, Follows one step commands with increased time    Cueing Cueing Techniques: Verbal cues, Tactile cues     General Comments General comments (skin integrity, edema, etc.): Spouse present and supportive during session. VSS on 1L      Pertinent Vitals/Pain Pain Assessment Pain Assessment: Faces Faces Pain Scale: Hurts a little  bit Pain Location:  throat Pain Descriptors / Indicators: Discomfort, Sore Pain Intervention(s): Limited activity within patient's tolerance, Monitored during session, Repositioned     PT Goals (current goals can now be found in the care plan section) Acute Rehab PT Goals PT Goal Formulation: With patient/family Time For Goal Achievement: 11/06/24 Potential to Achieve Goals: Fair Progress towards PT goals: Progressing toward goals    Frequency    Min 2X/week       AM-PAC PT 6 Clicks Mobility   Outcome Measure  Help needed turning from your back to your side while in a flat bed without using bedrails?: A Little Help needed moving from lying on your back to sitting on the side of a flat bed without using bedrails?: A Lot Help needed moving to and from a bed to a chair (including a wheelchair)?: A Lot Help needed standing up from a chair using your arms (e.g., wheelchair or bedside chair)?: A Little Help needed to walk in hospital room?: Total Help needed climbing 3-5 steps with a railing? : Total 6 Click Score: 12    End of Session Equipment Utilized During Treatment: Gait belt;Oxygen  Activity Tolerance: Patient limited by fatigue;Patient limited by lethargy Patient left: in chair;with call bell/phone within reach;with family/visitor present Nurse Communication: Mobility status PT Visit Diagnosis: Unsteadiness on feet (R26.81);Other abnormalities of gait and mobility (R26.89);Muscle weakness (generalized) (M62.81)     Time: 8541-8478 PT Time Calculation (min) (ACUTE ONLY): 23 min  Charges:    $Therapeutic Activity: 23-37 mins PT General Charges $$ ACUTE PT VISIT: 1 Visit           Kate ORN, PT, DPT Secure Chat Preferred  Rehab Office [phone]   Kate BRAVO [name] 10/25/2024, 4:19 PM

## 2024-10-25 NOTE — TOC Progression Note (Signed)
 Transition of Care [facility]) - Progression Note    Patient Details  Name: [name] MRN: [id number] Date of Birth: Oct 11, 1945  [facility] CM/SW Contact  Waddell Barnie Rama, RN Phone Number: 10/25/2024, 2:08 PM  Clinical Narrative:    NCM was notified by Camellia NP, that spouse and patient would like to speak to [facility] Rep regarding home hospice and Residential hospice, this NCM called [facility] left message for a return call to spouse.     Expected Discharge Plan: Home w Home Health Services Barriers to Discharge: Continued Medical Work up               Expected Discharge Plan and Services In-house Referral: Clinical Social Work   Post Acute Care Choice: Resumption of Svcs/PTA Provider Living arrangements for the past 2 months: Single Family Home                           HH Arranged: PT HH Agency: [facility] Date [facility] Agency Contacted: 10/09/24 Time HH Agency Contacted: 1138 Representative spoke with at [facility] Agency: Darleene   Social Drivers of Health (SDOH) Interventions SDOH Screenings   Food Insecurity: No Food Insecurity (10/09/2024)  Housing: Low Risk (10/09/2024)  Transportation Needs: No Transportation Needs (10/09/2024)  Utilities: Not At Risk (10/09/2024)  Social Connections: Moderately Isolated (10/09/2024)  Tobacco Use: Low Risk (10/08/2024)    Readmission Risk Interventions    10/09/2024   11:16 AM 09/17/2024   11:14 AM 09/16/2024   11:53 AM  Readmission Risk Prevention Plan  Transportation Screening Complete Complete Complete  Medication Review Oceanographer) Complete Complete Complete  PCP or Specialist appointment within 3-5 days of discharge  Complete   HRI or Home Care Consult Complete Complete Complete  SW Recovery Care/Counseling Consult Complete Complete Complete  Palliative Care Screening Not Applicable Not Applicable Not Applicable  Skilled Nursing Facility Not Applicable Not Applicable Not Applicable

## 2024-10-25 NOTE — Progress Notes (Signed)
 This chaplain responded to PMT NP-Eric referral for Pt. and family spiritual care. The Pt. husband is at the bedside at the time of the visit. The chaplain assisted [name] with cold compresses on the Pt. forehead, tissues, and additional washcloths.   The Pt. described secretions stuck in the back of her throat with unsuccessful coughing and attempts to spit out the secretions throughout the visit. The chaplain updated the Pt. RN-Estella.  [name] accepted the chaplain's invitation for story telling as a place of rapport building. The chaplain listened reflectively as [name] described their relationship and life together. The chaplain observed [name] taking the opportunity to sit down and relax during the visit.  This chaplain is available for F/U spiritual care as needed.  Chaplain Leeroy Hummer [phone]

## 2024-10-25 NOTE — Plan of Care (Signed)
   Problem: Clinical Measurements: Goal: Diagnostic test results will improve Outcome: Progressing

## 2024-10-25 NOTE — Progress Notes (Signed)
 " Progress Note   Patient: [name] FMW:[id number] DOB: 08/08/1946 DOA: 10/08/2024     17 DOS: the patient was seen and examined on 10/25/2024   Brief hospital course: [name] was admitted to the hospital with the working diagnosis of heart failure exacerbation,   79 y.o. female with medical history significant for paroxysmal A-fib, heart failure, prior CVA, carotid artery stenosis, valvular heart disease, CKD 3B, hypertension, hyperlipidemia, type 2 diabetes, hypothyroidism, COPD who presented with dyspnea and lower extremity edema. On the day of admission she had severe symptoms, prompting EMS call. He was found with increased work of breathing, but 02 saturation 96% on room air, she was placed on supplemental 02 per  2 L/min and was transported to the ED.  On her initial physical examination her blood pressure was 127/60, HR 72, RR 21 and 02 saturation 95% on supplemental 02 per   Lungs with diffuse rales bilaterally, heart with S1 and S2 present and regular, positive systolic murmur at the base, positive JVD, abdomen with no distention and positive lower extremity edema ++.   WBC 5.7, hemoglobin 10.7, platelets 143.  Sodium 133, potassium 3.8, bicarbonate 30, serum creatinine 1.55.  Chest x-ray showed hazy right lower lobe opacity.  There was mild vascular congestion.  The patient was started on IV furosemide .   01/17 transferred to [facility] for further valvular work up.   01/22 cardiac catheterization with severe mitral and aortic stenosis,elevated filling pressures, with low cardiac index, low output state.  Plan to start on milrinone   01/23 patient continue clinical deterioration while on milrinone , decision was made to transition to palliative care.  Discontinue milrinone    Assessment and Plan: * Acute on chronic diastolic (congestive) heart failure (HCC) Echocardiogram with preserved LV systolic function with EF 50 to 55%, interventricular septum is flattened in systole, RV  systolic function with severe reduction, RV cavity with severe enlargement, RVSP 50.3 mmHg, LA and RA with moderate dilatation, mitral valve replaced noted severe stenosis, aortic valve replaced with severe stenosis,   Acute on chronic cor pulmonale Pulmonary hypertension RV failure   01/22 cardiac catheterization  RA 10 RV 95/8 PA 96/26 mean 53  PCWP 17  Cardiac output 3,8 index 2.3 (fick) Cardiac output 2,4 index 1,5 (thermodilution)  PVR 9.5 WU (fick) 14.8 (TD)  Pre and post capillary pulmonary hypertension with low output state.   Urine output is 1475 ml Systolic blood pressure 130 mmHg range.   Not candidate for valve surgical intervention  No improvement with milrinone , poor prognosis, transitioned to palliative care.  End stage heart failure  Palliative diuresis with oral torsemide    Essential hypertension Continue blood pressure monitoring   PAF (paroxysmal atrial fibrillation) (HCC) Continue amiodarone  Anticoagulation with apixaban   Continue telemetry   Type 2 diabetes mellitus with hyperlipidemia (HCC) Continue glucose cover and monitoring with insulin  sliding scale.  Continue with statin therapy   Asthma, cough variant COPD No signs of acute exacerbation Continue with bronchodilator therapy   Acquired hypothyroidism Continue levothyroxine    Gastroesophageal reflux disease Continue with pantoprazole          Subjective: Patient is lethargic and poorly responsive, very weak and deconditioned.   Physical Exam: Vitals:   10/24/24 2304 10/25/24 0353 10/25/24 0500 10/25/24 1115  BP: (!) 132/52 (!) 129/104 (!) 121/58 112/62  Pulse: 99 72 90 83  Resp: (!) 23 (!) 21 19 19   Temp: 98.3 F (36.8 C) 98.3 F (36.8 C) 98 F (36.7 C) 98 F (36.7 C)  TempSrc: Oral Oral Oral Oral  SpO2:  94% 91% 98%  Weight:  66.6 kg    Height:       Neurology lethargic and poorly responsive ENT with positive pallor Cardiovascular with S1 and S2 present and regular  with positive systolic murmur at the base with no gallops or rubs Mild JVD Respiratory with poor inspiratory effort, positive rales with no wheezing or rhonchi Abdomen with no distention, soft and non tender Positive trace lower extremity edema   Data Reviewed:    Family Communication: I spoke with patient's husband at the bedside, we talked in detail about patient's condition, plan of care and prognosis and all questions were addressed.   Disposition: Status is: Inpatient Remains inpatient appropriate because: transition to palliative care   Planned Discharge Destination: Home      Author: Elidia Toribio Furnace, MD 10/25/2024 4:22 PM  For on call review [email or web address].  "

## 2024-10-25 NOTE — Progress Notes (Signed)
 Occupational Therapy Treatment Patient Details Name: [name] MRN: [id number] DOB: 02-06-1946 Today's Date: 10/25/2024   History of present illness 79 y.o. female admitted 10/08/24 with worsening SOB, BLE edema. Workup for acute hypoxemic respiratory failure secondary to acute on chronic HFpEF, new severe RV dysfunction, E. Coli UTI. Repeat echo 1/15 with mod-severe prosthetic MS and AS. Potential for RHC 1/22. PMH includes PAF on Eliquis , HFpEF, CVA, carotid artery stenosis, CKD, HTN, HLD, DM2, COPD, lumbar sx, mild cognitive impairment, s/p bioprosthetic aortic and mitral valve (2014), fall with ICH/IVH (06/2024).   OT comments  Pt making slow progress with functional goals. Pt continues to be limited by decreased strength/activity tolerance. Pt's spouse reports pt assisted to [facility] by nursing staff earlier today. Pt required mod A to sit EOB and to scoot hips forward. Session focused on sitting tolerance, UB selfcare, LB bathing and grooming/hygiene tasks. Pt declined STS/SPT to [facility] due to being limited by fatigue and assisted back to supine with rolling L and R to change bed pads underneath pt. Pt with O2 [place] across bridge of nose upon OT arrival with SATs 96%, SATs dropped to 88% while seated EOB during activity and O2 placed back on O2 with SATs increasing to 94%. OT will continue to follow acutely to maximize level of function and safety      If plan is discharge home, recommend the following:  A lot of help with walking and/or transfers;A little help with bathing/dressing/bathroom;Assistance with cooking/housework;Assist for transportation;Help with stairs or ramp for entrance   Equipment Recommendations  Other (comment) (defer)    Recommendations for Other Services      Precautions / Restrictions Precautions Precautions: Fall;Other (comment) Recall of Precautions/Restrictions: Impaired Precaution/Restrictions Comments: watch SpO2 (does not wear baseline); urine  incontinence Restrictions Weight Bearing Restrictions Per Provider Order: No       Mobility Bed Mobility Overal bed mobility: Needs Assistance Bed Mobility: Supine to Sit, Sit to Supine, Rolling Rolling: Supervision     Sit to supine: Mod assist   General bed mobility comments: mod A to elevate trunk and with LE mgt, used pad underneath pt to scoot hips forward    Transfers                         Balance Overall balance assessment: Needs assistance Sitting-balance support: Single extremity supported, Feet supported Sitting balance-Leahy Scale: Fair Sitting balance - Comments: pt sat EOB for grooming/hygiene and UB selfcare tasks, fatigues easily                                   ADL either performed or assessed with clinical judgement   ADL Overall ADL's : Needs assistance/impaired     Grooming: Wash/dry hands;Wash/dry face;Contact guard assist;Sitting   Upper Body Bathing: Minimal assistance;Sitting   Lower Body Bathing: Maximal assistance;Sitting/lateral leans   Upper Body Dressing : Minimal assistance;Sitting                     General ADL Comments: Pt with decreased strength/activity tolerance, limited by fatigue and declined STS/SPTs to [facility] this session. Pt's spouse reports pt assisted to [facility] by nursing staff earlier today. Pt with O2 [place] across bridge of nose upon OT arrival with SATs 96%, SATs dropped to 88% while seated EOB during activity and O2 placed back on pt    Extremity/Trunk Assessment Upper Extremity Assessment Upper  Extremity Assessment: Generalized weakness   Lower Extremity Assessment Lower Extremity Assessment: Defer to PT evaluation        Vision Ability to See in Adequate Light: 0 Adequate Patient Visual Report: No change from baseline     Perception     Praxis     Communication Communication Communication: No apparent difficulties   Cognition Arousal: Alert Behavior During Therapy: Flat affect                                  Following commands: Intact Following commands impaired: Only follows one step commands consistently, Follows one step commands with increased time      Cueing   Cueing Techniques: Verbal cues, Tactile cues  Exercises      Shoulder Instructions       General Comments      Pertinent Vitals/ Pain       Pain Assessment Pain Assessment: No/denies pain Pain Score: 0-No pain Pain Intervention(s): Monitored during session  Home Living                                          Prior Functioning/Environment              Frequency  Min 2X/week        Progress Toward Goals  OT Goals(current goals can now be found in the care plan section)  Progress towards OT goals: Progressing toward goals     Plan      Co-evaluation                 AM-PAC OT 6 Clicks Daily Activity     Outcome Measure   Help from another person eating meals?: A Little Help from another person taking care of personal grooming?: A Little Help from another person toileting, which includes using toliet, bedpan, or urinal?: A Lot Help from another person bathing (including washing, rinsing, drying)?: A Lot Help from another person to put on and taking off regular upper body clothing?: A Little Help from another person to put on and taking off regular lower body clothing?: A Lot 6 Click Score: 15    End of Session Equipment Utilized During Treatment: Oxygen   OT Visit Diagnosis: Unsteadiness on feet (R26.81);Repeated falls (R29.6);Muscle weakness (generalized) (M62.81);Other abnormalities of gait and mobility (R26.89);History of falling (Z91.81)   Activity Tolerance Patient limited by fatigue   Patient Left in bed;with call bell/phone within reach;with family/visitor present   Nurse Communication Mobility status        Time: 8983-8950 OT Time Calculation (min): 33 min  Charges: OT General Charges $OT Visit: 1 Visit OT  Treatments $Self Care/Home Management : 8-22 mins $Therapeutic Activity: 8-22 mins    Jacques Karna Loose 10/25/2024, 12:39 PM

## 2024-10-25 NOTE — Plan of Care (Signed)

## 2024-10-26 ENCOUNTER — Other Ambulatory Visit (HOSPITAL_COMMUNITY): Payer: Self-pay

## 2024-10-26 DIAGNOSIS — I110 Hypertensive heart disease with heart failure: Secondary | ICD-10-CM | POA: Diagnosis not present

## 2024-10-26 DIAGNOSIS — E039 Hypothyroidism, unspecified: Secondary | ICD-10-CM | POA: Diagnosis not present

## 2024-10-26 DIAGNOSIS — I10 Essential (primary) hypertension: Secondary | ICD-10-CM | POA: Diagnosis not present

## 2024-10-26 DIAGNOSIS — I5033 Acute on chronic diastolic (congestive) heart failure: Secondary | ICD-10-CM | POA: Diagnosis not present

## 2024-10-26 DIAGNOSIS — I480 Paroxysmal atrial fibrillation: Secondary | ICD-10-CM | POA: Diagnosis not present

## 2024-10-26 DIAGNOSIS — N1832 Chronic kidney disease, stage 3b: Secondary | ICD-10-CM | POA: Diagnosis not present

## 2024-10-26 DIAGNOSIS — Z515 Encounter for palliative care: Secondary | ICD-10-CM | POA: Diagnosis not present

## 2024-10-26 LAB — GLUCOSE, CAPILLARY
Glucose-Capillary: 136 mg/dL — ABNORMAL HIGH (ref 70–99)
Glucose-Capillary: 174 mg/dL — ABNORMAL HIGH (ref 70–99)

## 2024-10-26 MED ORDER — TORSEMIDE 20 MG PO TABS
20.0000 mg | ORAL_TABLET | Freq: Every day | ORAL | 0 refills | Status: AC
  Filled 2024-10-26: qty 30, 30d supply, fill #0

## 2024-10-26 NOTE — Assessment & Plan Note (Signed)
 AKI, hypokalemia. Hyponatremia  Baseline serum cr at 1,3   Patient was placed on furosemide  for diuresis, electrolytes were corrected.  At the time of her discharge her serum cr is 1,27 with K at 4.1 and serum bicarbonate aet 21 Na 135 and Mg 2.0  Plan to continue torsemide  and K supplementation, follow up renal function as outpatient

## 2024-10-26 NOTE — Discharge Summary (Addendum)
 " Physician Discharge Summary   Patient: [name] MRN: [id number] DOB: 06/27/1946  Admit date:     10/08/2024  Discharge date: 10/26/24  Discharge Physician: Elidia Toribio Furnace   PCP: Sheryle Carwin, MD   Recommendations at discharge:   Patient with end stage heart failure, cor pulmonale, pulmonary hypertension and RV failure, poor prognosis, plan to continue care under hospice.  Continue torsemide  for palliative diuresis.  Follow up renal function and electrolytes in 7 days  Follow up with Dr Sheryle in 7 to 10 days Follow up with Cardiology as scheduled   I spoke with patient's husband at the bedside, we talked in detail about patient's condition, plan of care and prognosis and all questions were addressed.  Discharge Diagnoses: Principal Problem:   Acute on chronic diastolic (congestive) heart failure (HCC) Active Problems:   Essential hypertension   PAF (paroxysmal atrial fibrillation) (HCC)   Type 2 diabetes mellitus with hyperlipidemia (HCC)   Asthma, cough variant   Acquired hypothyroidism   Gastroesophageal reflux disease  Resolved Problems:   * No resolved hospital problems. [facility] Course: [name] was admitted to the hospital with the working diagnosis of heart failure exacerbation,   79 y.o. female with medical history significant for paroxysmal A-fib, heart failure, prior CVA, carotid artery stenosis, valvular heart disease, CKD 3B, hypertension, hyperlipidemia, type 2 diabetes, hypothyroidism, COPD who presented with dyspnea and lower extremity edema. On the day of admission she had severe symptoms, prompting EMS call. She was found with increased work of breathing, but 02 saturation 96% on room air, she was placed on supplemental 02 per [place] 2 L/min and was transported to the ED.  On her initial physical examination her blood pressure was 127/60, HR 72, RR 21 and 02 saturation 95% on supplemental 02 per [place]  Lungs with diffuse rales bilaterally, heart with S1  and S2 present and regular, positive systolic murmur at the base, positive JVD, abdomen with no distention and positive lower extremity edema ++.   Na 133, K 3.8 Cl 91 bicarbonate 30, glucose 122, bun 16 cr 1,55 Mg 1.3  Pro BNP 5,574 High sensitive troponin 42 and 41  Pro calcitonin < 0.10  Wbc 5.7 hgb 10.7 plt 143  D dimer 2.26  Sars COVID 19 negative Influenza negative RSV negative   Urine analysis SG 1,004, negative protein, large leukocytes, small hgb, negative glucose, 21-50 wbc, 0-5 rbc  Urine Na 38   Chest radiograph with cardiomegaly, bilateral hilar vascular congestion and bilateral central interstitial infiltrates, sternotomy wires and valve hardware in place (mitral and aortic)  CT chest with bilateral ground glass opacities, right pleural effusion with fluid in the fissure. Left pleural effusion.   EKG 71 bpm, right axis deviation, qtc 594, sinus rhythm with first degree AV block, with no significant ST segment or T wave changes.   Patient was placed on IV furosemide  for diuresis  Echocardiogram with mild reduction in  LV systolic function, positive signs of RV failure.  01/17 transferred from [facility] to [facility] for further valvular work up.   01/22 cardiac catheterization with severe mitral and aortic stenosis,elevated filling pressures, with low cardiac index, low output state.  Started on trial of IV milrinone  per mid line   01/23 patient continue clinical deterioration while on milrinone , decision was made to transition to palliative care.  Discontinue milrinone   01/24 continue care at home  under hospice services.   Assessment and Plan: * Acute on chronic diastolic (congestive) heart  failure (HCC) Echocardiogram with preserved LV systolic function with EF 50 to 55%, interventricular septum is flattened in systole, RV systolic function with severe reduction, RV cavity with severe enlargement, RVSP 50.3 mmHg, LA and RA with moderate dilatation, mitral valve replaced noted severe  stenosis, aortic valve replaced with severe stenosis,   Acute on chronic cor pulmonale Pulmonary hypertension RV failure   01/22 cardiac catheterization  RA 10 RV 95/8 PA 96/26 mean 53  PCWP 17  Cardiac output 3,8 index 2.3 (fick) Cardiac output 2,4 index 1,5 (thermodilution)  PVR 9.5 WU (fick) 14.8 (TD)  Pre and post capillary pulmonary hypertension with low output state.   Patient was placed on IV furosemide  for diuresis, negative fluid balance was achieved, - 9,738 ml, with improvement in her symptoms.   Patient not candidate for valve interventions due to high risk.  She had a trial of IV milrinone  with no improvement in her symptoms or hemodynamics.  She was determined to be poor prognosis, transitioned to palliative care.  End stage heart failure  Palliative diuresis with oral torsemide   Continue care under hospice at home   Essential hypertension Holding antihypertensive agents to avoid hypotension   PAF (paroxysmal atrial fibrillation) (HCC) Continue amiodarone  Anticoagulation with apixaban    Chronic kidney disease, stage 3b (HCC) AKI, hypokalemia. Hyponatremia  Baseline serum cr at 1,3   Patient was placed on furosemide  for diuresis, electrolytes were corrected.  At the time of her discharge her serum cr is 1,27 with K at 4.1 and serum bicarbonate aet 21 Na 135 and Mg 2.0  Plan to continue torsemide  and K supplementation, follow up renal function as outpatient    Asthma, cough variant COPD No signs of acute exacerbation Continue with bronchodilator therapy   Acquired hypothyroidism Continue levothyroxine    Gastroesophageal reflux disease Continue with pantoprazole    Type 2 diabetes mellitus with hyperlipidemia (HCC) Patient was placed on insulin  sliding scale for glucose cover and monitoring during her hospitalization  Glucose remained well controlled.  At the time of her discharge will resume metformin   Continue with statin therapy         Consultants: cardiology  Procedures performed: cardiac catheterization   Disposition: Home Diet recommendation:  Cardiac diet DISCHARGE MEDICATION: Allergies as of 10/26/2024   No Known Allergies      Medication List     TAKE these medications    acetaminophen  325 MG tablet Commonly known as: Tylenol  Take 2 tablets (650 mg total) by mouth every 6 (six) hours as needed.   albuterol  108 (90 Base) MCG/ACT inhaler Commonly known as: VENTOLIN  HFA Inhale 2 puffs into the lungs every 6 (six) hours as needed for wheezing or shortness of breath.   amiodarone  200 MG tablet Commonly known as: PACERONE  Take 1 tablet (200 mg total) by mouth daily.   apixaban  5 MG Tabs tablet Commonly known as: ELIQUIS  Take 1 tablet (5 mg total) by mouth 2 (two) times daily.   atorvastatin  40 MG tablet Commonly known as: LIPITOR Take 1 tablet (40 mg total) by mouth daily.   azelastine  0.1 % nasal spray Commonly known as: ASTELIN  Place 1 spray into both nostrils 2 (two) times daily. Use in each nostril as directed What changed:  when to take this reasons to take this   benzonatate  100 MG capsule Commonly known as: TESSALON  Take 1 capsule (100 mg total) by mouth 3 (three) times daily as needed for cough.   escitalopram  10 MG tablet Commonly known as: LEXAPRO  Take 10 mg  by mouth daily with breakfast.   feeding supplement Liqd Take 237 mLs by mouth 2 (two) times daily between meals.   levothyroxine  100 MCG tablet Commonly known as: SYNTHROID  Take 100 mcg by mouth daily before breakfast.   meclizine  25 MG tablet Commonly known as: ANTIVERT  Take 1 tablet (25 mg total) by mouth every 6 (six) hours as needed for dizziness.   metFORMIN  500 MG tablet Commonly known as: GLUCOPHAGE  Take 1 tablet (500 mg total) by mouth 2 (two) times daily with a meal.   multivitamin with minerals Tabs tablet Take 1 tablet by mouth in the morning.   ondansetron  4 MG disintegrating tablet Commonly known  as: ZOFRAN -ODT Take 4 mg by mouth every 6 (six) hours as needed for nausea or vomiting.   pantoprazole  40 MG tablet Commonly known as: PROTONIX  TAKE 1 TABLET 30 TO 60 MINUTES BEFORE FIRST MEAL OF THE DAY. What changed: See the new instructions.   potassium chloride  SA 20 MEQ tablet Commonly known as: KLOR-CON  M Take 1 tablet (20 mEq total) by mouth daily. What changed: when to take this   SYSTANE OP Place 1 drop into both eyes daily as needed (dry eyes).   torsemide  20 MG tablet Commonly known as: DEMADEX  Take 1 tablet (20 mg total) by mouth daily.   Trelegy Ellipta  100-62.5-25 MCG/ACT Aepb Generic drug: Fluticasone -Umeclidin-Vilant Inhale 1 puff into the lungs daily.        Contact information for follow-up providers     [facility], [facility] Follow up.   Specialty: Home Health Services Contact information: [address] [place] [address] [phone]         [facility]. Go on 11/11/2024.   Specialty: Cardiology Why: Hospital Follow-Up 11/11/2024 @ 11:15 AM Please bring all medications to follow-up appointment  [facility], Entrance C  off of [address] [place] Parking at the door or use [employer] Contact information: [address] [place]  [address] [phone]             Contact information for after-discharge care     Destination     [facility] .   Service: Skilled Nursing Contact information: [address] [place]  [address] [phone]                    Discharge Exam: Filed Weights   10/24/24 1439 10/25/24 0353 10/26/24 0301  Weight: 66 kg 66.6 kg 62.8 kg   BP 114/82 (BP Location: Left Wrist)   Pulse 80   Temp 97.9 F (36.6 C) (Oral)   Resp 19   Ht 5' 2 (1.575 m)   Wt 62.8 kg   SpO2 92%   BMI 25.32 kg/m   Patient continue very weak and deconditioned, at rest with no dyspnea or  chest pain. She feels comfortable going home today with hospice services. His husband at the bedside and agrees with her decision   Neurology awake and alert, deconditioned ENT with mild pallor Cardiovascular with S1 and S2 present and regular, positive systolic murmur at the base with no gallops Mild JVD Respiratory with mild rales at bases with no wheezing or rhonchi Abdomen with no distention, soft and non tender No lower extremity edema    Condition at discharge: stable  The results of significant diagnostics from this hospitalization (including imaging, microbiology, ancillary and laboratory) are listed below for reference.   Imaging Studies: US   EKG SITE RITE Result Date: 10/25/2024 If Site Rite image not attached, placement could not be confirmed due to current cardiac rhythm.  CARDIAC CATHETERIZATION Result Date: 10/24/2024   1st Diag lesion is 70% stenosed.   Prox RCA lesion is 20% stenosed.   The left ventricular ejection fraction is 50-55% by visual estimate. Findings: Ao = 124/48 (79) LV =  142/8 RA = 10 RV = 95/8 PA = 96/26 (53) PCW = 17 Fick cardiac output/index = 3.8/2.3 Thermodilution CO/CI = 2.4/1.5 PVR = 9.5 WU (Fick) 14.8 WU (TD) Ao sat = 92% PA sat = 48%, 50% PAPi = 7.0 AV gradient on pullback mean 16.4mmHG calculated AVA 1.09cm2 MV gradient on simultaneous LV/wedge tracings mean 11.94mmHG MVA 0.57cm2 Assessment: 1. Mild non-obstructive CAD (D1 70%, RCA 20%) 2. Normal LV function EF 55% 3. Severe prosthetic MV stenosis 4. Moderate to severe AoV stenosis 5. Severe PAH with high PVR and normal PAPi Plan/Discussion: Very difficult situation with severe, possible fixed PAH and low output in setting of severe prosthetic MV stenosis and moderate to severe prosthetic AoV stenosis. Currently not candidate for TMVR. Will start milrinone  to try and support rehab (will need SNF that accepts milrinone ). If clinically improving, will repeat RHC in several weeks to reassess candidacy for  TMVR. Toribio Fuel, MD 8:58 AM  ECHOCARDIOGRAM COMPLETE Result Date: 10/22/2024    ECHOCARDIOGRAM REPORT   Patient Name:   [name] Date of Exam: 10/21/2024 Medical Rec #:  [id number]       Height:       62.0 in Accession #:    [id number]      Weight:       151.0 lb Date of Birth:  [birth year]       BSA:          1.697 m Patient Age:    78 years        BP:           121/61 mmHg Patient Gender: F               HR:           70 bpm. Exam Location:  Inpatient Procedure: 2D Echo (Both Spectral and Color Flow Doppler were utilized during            procedure). Indications:    status post aortic valve replacement  History:        Patient has prior history of Echocardiogram examinations, most                 recent 10/17/2024. Chronic kidney disease. History of CVA.,                 rheumatic valve disease, Arrythmias:Paroxysmal Afib,                 Signs/Symptoms:Dyspnea; Risk Factors:Hypertension and                 Dyslipidemia.                 Aortic Valve: 21 mm Magna bioprosthetic valve is present in the                 aortic position. Procedure Date: 07/18/13.                 Mitral Valve: 25 mm Magna Ease bioprosthetic valve valve is                 present in the mitral  position. Procedure Date: 07/18/13.  Sonographer:    Tinnie Barefoot RDCS Referring Phys: [id number] CALLIE E GOODRICH IMPRESSIONS  1. Flattened LV septum in systole suggesting PHT. Left ventricular ejection fraction, by estimation, is 50 to 55%. The left ventricle has low normal function. The left ventricle has no regional wall motion abnormalities. Left ventricular diastolic parameters are indeterminate. There is the interventricular septum is flattened in systole, consistent with right ventricular pressure overload.  2. Right ventricular systolic function is severely reduced. The right ventricular size is severely enlarged. There is moderately elevated pulmonary artery systolic pressure.  3. Left atrial size was moderately dilated.   4. Right atrial size was moderately dilated.  5. 11[age]81 yo 25 mm Magna Ease bioprosthetic mitral valve Severe residual MAC ? Calcium  noted in lateral and dome of atrium Trivial MR and severe MS Orientation of stent valve abuts the septum and aortic/annular angle seems unfavorable for valve in valve stent valve Rx. The mitral valve has been repaired/replaced. Trivial mitral valve regurgitation. Severe mitral stenosis. Severe mitral annular calcification. There is a 25 mm Magna Ease bioprosthetic valve present in the mitral position. Procedure Date: 07/18/13.  6. 11,81 yo degenerative 21 mm Magna Ease bioprosthetic AV Trivial AR and severe AS by AVA. Valve leaflets appear calcified . The aortic valve has been repaired/replaced. There is severe calcifcation of the aortic valve. There is severe thickening of the  aortic valve. Aortic valve regurgitation is trivial. Severe aortic valve stenosis. There is a 21 mm Magna bioprosthetic valve present in the aortic position. Procedure Date: 07/18/13. FINDINGS  Left Ventricle: Flattened LV septum in systole suggesting PHT. Left ventricular ejection fraction, by estimation, is 50 to 55%. The left ventricle has low normal function. The left ventricle has no regional wall motion abnormalities. Strain was performed and the global longitudinal strain is indeterminate. The left ventricular internal cavity size was normal in size. There is no left ventricular hypertrophy. The interventricular septum is flattened in systole, consistent with right ventricular pressure overload. Left ventricular diastolic parameters are indeterminate. Right Ventricle: The right ventricular size is severely enlarged. Right vetricular wall thickness was not assessed. Right ventricular systolic function is severely reduced. There is moderately elevated pulmonary artery systolic pressure. The tricuspid regurgitant velocity is 2.97 m/s, and with an assumed right atrial pressure of 15 mmHg, the estimated right  ventricular systolic pressure is 50.3 mmHg. Left Atrium: Left atrial size was moderately dilated. Right Atrium: Right atrial size was moderately dilated. Pericardium: There is no evidence of pericardial effusion. Mitral Valve: 11.81 yo 23 mm Magna Ease bioprosthetic mitral valve Severe residual MAC ? Calcium  noted in lateral and dome of atrium Trivial MR and severe MS Orientation of stent valve abuts the septum and aortic/annular angle seems unfavorable for valve in valve stent valve Rx. The mitral valve has been repaired/replaced. There is severe thickening of the mitral valve leaflet(s). There is severe calcification of the mitral valve leaflet(s). Severely decreased mobility of the mitral valve leaflets. Severe mitral annular calcification. Trivial mitral valve regurgitation. There is a 25 mm Magna Ease bioprosthetic valve present in the mitral position. Procedure Date: 07/18/13. Severe mitral valve stenosis. MV peak gradient, 33.4 mmHg. The mean mitral valve gradient is 16.5 mmHg. Tricuspid Valve: The tricuspid valve is normal in structure. Tricuspid valve regurgitation is mild. Aortic Valve: 87,81 yo degenerative 21 mm Magna Ease bioprosthetic AV Trivial AR and severe AS by AVA. Valve leaflets appear calcified. The aortic valve has been repaired/replaced. There is severe calcifcation of  the aortic valve. There is severe thickening of the aortic valve. Aortic valve regurgitation is trivial. Severe aortic stenosis is present. Aortic valve mean gradient measures 21.0 mmHg. Aortic valve peak gradient measures 31.6 mmHg. Aortic valve area, by VTI measures 0.50 cm. There is a 21 mm Magna bioprosthetic valve present in the aortic position. Procedure Date: 07/18/13. Pulmonic Valve: The pulmonic valve was normal in structure. Pulmonic valve regurgitation is mild. Aorta: The aortic root is normal in size and structure. IAS/Shunts: No atrial level shunt detected by color flow Doppler. Additional Comments: 3D was performed  not requiring image post processing on an independent workstation and was indeterminate.  LEFT VENTRICLE PLAX 2D LVIDd:         3.10 cm LVIDs:         2.50 cm LV PW:         1.00 cm LV IVS:        1.10 cm LVOT diam:     1.50 cm LV SV:         30 LV SV Index:   18 LVOT Area:     1.77 cm  LV Volumes (MOD) LV vol d, MOD A2C: 50.4 ml LV vol d, MOD A4C: 38.7 ml LV vol s, MOD A2C: 37.5 ml LV vol s, MOD A4C: 22.7 ml LV SV MOD A2C:     12.9 ml LV SV MOD A4C:     38.7 ml LV SV MOD BP:      14.7 ml RIGHT VENTRICLE            IVC RV Basal diam:  2.70 cm    IVC diam: 2.00 cm RV S prime:     4.13 cm/s TAPSE (M-mode): 0.7 cm LEFT ATRIUM             Index        RIGHT ATRIUM           Index LA diam:        4.40 cm 2.59 cm/m   RA Area:     16.60 cm LA Vol (A2C):   54.2 ml 31.95 ml/m  RA Volume:   45.00 ml  26.52 ml/m LA Vol (A4C):   83.4 ml 49.16 ml/m LA Biplane Vol: 72.9 ml 42.97 ml/m  AORTIC VALVE AV Area (Vmax):    0.54 cm AV Area (Vmean):   0.46 cm AV Area (VTI):     0.50 cm AV Vmax:           281.00 cm/s AV Vmean:          217.000 cm/s AV VTI:            0.608 m AV Peak Grad:      31.6 mmHg AV Mean Grad:      21.0 mmHg LVOT Vmax:         85.30 cm/s LVOT Vmean:        56.300 cm/s LVOT VTI:          0.172 m LVOT/AV VTI ratio: 0.28  AORTA Ao Root diam: 2.40 cm Ao Asc diam:  2.90 cm MITRAL VALVE              TRICUSPID VALVE MV Area VTI:  0.35 cm    TR Peak grad:   35.3 mmHg MV Peak grad: 33.4 mmHg   TR Vmax:        297.00 cm/s MV Mean grad: 16.5 mmHg MV Vmax:      2.89 m/s  SHUNTS MV Vmean:     194.5 cm/s  Systemic VTI:  0.17 m                           Systemic Diam: 1.50 cm Maude Emmer MD Electronically signed by Maude Emmer MD Signature Date/Time: 10/22/2024/5:25:34 PM    Final    CT CORONARY MORPH W/CTA COR W/SCORE DEL W/CM &/OR WO/CM Addendum Date: 10/21/2024 ADDENDUM REPORT: 10/21/2024 21:18 EXAM: OVER-READ INTERPRETATION  CT CHEST The following report is an over-read performed by radiologist Dr. Vanetta [facility] on 10/21/2024. This over-read does not include interpretation of cardiac or coronary anatomy or pathology. The coronary CTA interpretation by the cardiologist is attached. COMPARISON:  CT dated 10/21/2024. FINDINGS: Mild atherosclerotic calcification of the visualized aorta. Top-normal subcarinal lymph node measures 10 mm short axis. The visualized esophagus is grossly unremarkable. No mediastinal fluid collection. Partially visualized small right and trace left pleural effusions. Faint diffuse hazy density throughout the lungs most consistent with edema. Pneumonia is not excluded. No acute osseous pathology. IMPRESSION: 1. Partially visualized small right and trace left pleural effusions. 2. Faint diffuse hazy density throughout the lungs most consistent with edema. Pneumonia is not excluded. 3.  Aortic Atherosclerosis (ICD10-I70.0). Electronically Signed   By: Vanetta Chou M.D.   On: 10/21/2024 21:18   Result Date: 10/21/2024 CLINICAL DATA:  Severe bioprosthetic aortic valve stenosis. Severe bioprosthetic mitral valve stenosis. AVR with 21 mm Magna Ease 07/19/2013 and 25 mm Magna Ease at same time. EXAM: Cardiac TAVR CT TECHNIQUE: A non-contrast, gated CT scan was obtained with axial slices of 2.5 mm through the heart for aortic valve scoring. A 120 kV retrospective, gated, contrast cardiac scan was obtained. Gantry rotation speed was 250 msec and collimation was 0.6 mm. Nitroglycerin  was not given. A delayed scan was obtained to exclude left atrial appendage thrombus. The 3D dataset was reconstructed in systole with motion correction. The 3D data set was reconstructed in 5% intervals of the 0-95% of the R-R cycle. Systolic and diastolic phases were analyzed on a dedicated workstation using MPR, MIP, and VRT modes. The patient received 100 cc of contrast. FINDINGS: Image quality: Excellent. Noise artifact is: Limited. Mitral prosthesis: A 25 mm Magna ease prosthesis is  present in the mitral position. The leaflets and calcified and thickened. Two of the leaflets are fixed and immobile consistent with structural deterioration. No evidence of valve thrombosis. The valve area is 0.79 cm2 by direct planimetry. ViV analysis: A valve in valve analysis was performed using a 26 mm S3. The neo-LVOT is 254 mm2, indicating low-risk for outflow traction obstruction. The aorto-mitral angle is 127 degrees which is favorable. Optimal coplanar projection: RAO 23 CRA 1 Aortic prosthesis: A 21 mm Magna ease prosthesis is present in the aortic position. The leaflets are calcified and thickened with restricted leaflet motion in systole. Findings consistent with structural deterioration. ViV analysis: A valve in valve analysis was performed using a 23 mm virtual transcatheter heart valve. The virtual valve to coronary (VTC) distance for the left main is 3.5 mm indicating high-risk for coronary obstruction. The RCA takes off above the virtual valve indicating low-risk for coronary obstruction. Optimal coplanar projection: LAO 11 CAU 2 Aorta: Sinus of Valsalva Measurements: Non-coronary: 27 mm Right-coronary: 27 mm Left-coronary: 27 mm Sinotubular Junction: 26 mm Ascending Thoracic Aorta: 30 mm Coronary Arteries: Normal coronary origin. Right dominance. This study did not use nitroglycerin  and stenosis severity could  not be determined. Right Atrium: Right atrial size is severely dilated. Contrast reflux into the IVC consistent with elevated RA pressure. Right Ventricle: The right ventricular cavity is severely dilated. The septum is flattened in systole/diastole concerning for volume/pressure overload. Left Atrium: Left atrial size is severely dilated. The LAA appears to be absent or surgically ligated, no filling defect. Left Ventricle: The ventricular cavity size is within normal limits. Pulmonary arteries: Dilated pulmonary artery suggestive of pulmonary hypertension. Pulmonary veins: Normal pulmonary  venous drainage. Pericardium: Normal thickness with no significant effusion or calcium  present. Extra-cardiac findings: See attached radiology report for non-cardiac structures. IMPRESSION: 1. Bioprosthetic aortic and mitral prostheses with evidence of structural deterioration as detailed above. 2. ViV analysis favorable for TMVR with low risk of outflow tract obstruction. 3. ViV analysis indicates high risk for coronary obstruction for TAVR. Left main VTC 3.5 mm. 4. Severely dilated right ventricle and right atrium. Contrast reflux into the IVC consistent with elevated RA pressure. 5. Dilated pulmonary artery suggestive of pulmonary hypertension. 6. The septum is flattened in systole/diastole concerning for volume/pressure overload. Darryle T. Barbaraann, MD Electronically Signed: By: Darryle Barbaraann M.D. On: 10/21/2024 21:05   CT Angio Abd/Pel w/ and/or w/o Result Date: 10/21/2024 EXAM: CTA CHEST, ABDOMEN AND PELVIS WITH CONTRAST 10/21/2024 03:37:00 PM TECHNIQUE: CTA of the chest was performed with the administration of intravenous contrast. CTA of the abdomen and pelvis was performed with the administration of intravenous contrast. Multiplanar reformatted images are provided for review. MIP images are provided for review. Automated exposure control, iterative reconstruction, and/or weight based adjustment of the mA/kV was utilized to reduce the radiation dose to as low as reasonably achievable. COMPARISON: CT abdomen/pelvis dated 09/11/2024 and CTA chest dated 01/25/2024. CLINICAL HISTORY: Aortic valve replacement (TAVR), pre-op eval. FINDINGS: VASCULATURE: AORTA: No acute finding. No abdominal aortic aneurysm. No dissection. Mild thoracic aortic atherosclerosis. PULMONARY ARTERIES: Study not tailored for evaluation of the pulmonary arteries, but there is no pulmonary embolism to the lobar level. GREAT VESSELS OF AORTIC ARCH: No acute finding. No dissection. No arterial occlusion or significant stenosis. CELIAC  TRUNK: Mild atherosclerotic calcifications at the origin of the celiac artery, patent. SUPERIOR MESENTERIC ARTERY: Mild atherosclerotic calcifications at the origin of the SMA, patent. INFERIOR MESENTERIC ARTERY: IMA is patent. RENAL ARTERIES: Mild atherosclerotic calcifications at the origin of the bilateral renal arteries, patent. ILIAC ARTERIES: Atherosclerotic calcifications of the bilateral iliac arteries, patent. CHEST: MEDIASTINUM: Cardiomegaly. Status post aortic and mitral valve replacement. Small mediastinal nodes, including an 11 mm short axis right azygoesophageal recess node, likely reactive. LUNGS AND PLEURA: Lung apices excluded. Small bilateral pleural effusions, partially loculated. Left pleural calcification, chronic, suggesting prior pleurodesis. Faint perihilar ground glass opacity suggests very mild interstitial edema. 4 mm subpleural left lower lobe nodule (image 52), unchanged since at least 2023, benign. No follow-up is recommended. No pneumothorax. THORACIC BONES AND SOFT TISSUES: Median sternotomy. Radiation changes in the anterior right hemithorax. Status post right breast lumpectomy. Degenerative changes of the thoracolumbar spine. ABDOMEN AND PELVIS: LIVER: The liver is unremarkable. GALLBLADDER AND BILE DUCTS: Gallbladder is unremarkable. No biliary ductal dilatation. SPLEEN: The spleen is unremarkable. PANCREAS: The pancreas is unremarkable. ADRENAL GLANDS: Bilateral adrenal glands demonstrate no acute abnormality. KIDNEYS, URETERS AND BLADDER: Bilateral renal cortical scarring/atrophy. No stones in the kidneys or ureters. No hydronephrosis. No perinephric or periureteral stranding. Urinary bladder is unremarkable. GI AND BOWEL: Stomach and duodenal sweep demonstrate no acute abnormality. Normal appendix (image 134). Sigmoid diverticulosis, without evidence of diverticulitis.  Mildly increased rectal stool burden with mild presacral stranding but no findings suspicious for stercoral  colitis. There is no bowel obstruction. No abnormal bowel wall thickening or distension. REPRODUCTIVE: Uterus is unremarkable. PERITONEUM AND RETROPERITONEUM: No ascites or free air. LYMPH NODES: No lymphadenopathy. ABDOMINAL BONES AND SOFT TISSUES: Degenerative changes of the thoracolumbar spine. No acute soft tissue abnormality. IMPRESSION: 1. No thoracoabdominal aortic aneurysm or dissection. No pulmonary embolism. 2. Atherosclerotic calcifications of the abdominopelvic vasculature, which remain patent. 3. Cardiomegaly. Very mild interstitial edema is possible. Small bilateral pleural effusions, partially loculated. 4. Status post aortic and mitral valve replacement. 5. Additional ancillary findings, as above. Electronically signed by: Pinkie Pebbles MD 10/21/2024 07:40 PM EST RP Workstation: [id number]   CT ANGIO CHEST AORTA W/CM & OR WO/CM Result Date: 10/21/2024 EXAM: CTA CHEST, ABDOMEN AND PELVIS WITH CONTRAST 10/21/2024 03:37:00 PM TECHNIQUE: CTA of the chest was performed with the administration of intravenous contrast. CTA of the abdomen and pelvis was performed with the administration of intravenous contrast. Multiplanar reformatted images are provided for review. MIP images are provided for review. Automated exposure control, iterative reconstruction, and/or weight based adjustment of the mA/kV was utilized to reduce the radiation dose to as low as reasonably achievable. COMPARISON: CT abdomen/pelvis dated 09/11/2024 and CTA chest dated 01/25/2024. CLINICAL HISTORY: Aortic valve replacement (TAVR), pre-op eval. FINDINGS: VASCULATURE: AORTA: No acute finding. No abdominal aortic aneurysm. No dissection. Mild thoracic aortic atherosclerosis. PULMONARY ARTERIES: Study not tailored for evaluation of the pulmonary arteries, but there is no pulmonary embolism to the lobar level. GREAT VESSELS OF AORTIC ARCH: No acute finding. No dissection. No arterial occlusion or significant stenosis. CELIAC TRUNK: Mild  atherosclerotic calcifications at the origin of the celiac artery, patent. SUPERIOR MESENTERIC ARTERY: Mild atherosclerotic calcifications at the origin of the SMA, patent. INFERIOR MESENTERIC ARTERY: IMA is patent. RENAL ARTERIES: Mild atherosclerotic calcifications at the origin of the bilateral renal arteries, patent. ILIAC ARTERIES: Atherosclerotic calcifications of the bilateral iliac arteries, patent. CHEST: MEDIASTINUM: Cardiomegaly. Status post aortic and mitral valve replacement. Small mediastinal nodes, including an 11 mm short axis right azygoesophageal recess node, likely reactive. LUNGS AND PLEURA: Lung apices excluded. Small bilateral pleural effusions, partially loculated. Left pleural calcification, chronic, suggesting prior pleurodesis. Faint perihilar ground glass opacity suggests very mild interstitial edema. 4 mm subpleural left lower lobe nodule (image 52), unchanged since at least 2023, benign. No follow-up is recommended. No pneumothorax. THORACIC BONES AND SOFT TISSUES: Median sternotomy. Radiation changes in the anterior right hemithorax. Status post right breast lumpectomy. Degenerative changes of the thoracolumbar spine. ABDOMEN AND PELVIS: LIVER: The liver is unremarkable. GALLBLADDER AND BILE DUCTS: Gallbladder is unremarkable. No biliary ductal dilatation. SPLEEN: The spleen is unremarkable. PANCREAS: The pancreas is unremarkable. ADRENAL GLANDS: Bilateral adrenal glands demonstrate no acute abnormality. KIDNEYS, URETERS AND BLADDER: Bilateral renal cortical scarring/atrophy. No stones in the kidneys or ureters. No hydronephrosis. No perinephric or periureteral stranding. Urinary bladder is unremarkable. GI AND BOWEL: Stomach and duodenal sweep demonstrate no acute abnormality. Normal appendix (image 134). Sigmoid diverticulosis, without evidence of diverticulitis. Mildly increased rectal stool burden with mild presacral stranding but no findings suspicious for stercoral colitis. There  is no bowel obstruction. No abnormal bowel wall thickening or distension. REPRODUCTIVE: Uterus is unremarkable. PERITONEUM AND RETROPERITONEUM: No ascites or free air. LYMPH NODES: No lymphadenopathy. ABDOMINAL BONES AND SOFT TISSUES: Degenerative changes of the thoracolumbar spine. No acute soft tissue abnormality. IMPRESSION: 1. No thoracoabdominal aortic aneurysm or dissection. No pulmonary embolism. 2. Atherosclerotic calcifications  of the abdominopelvic vasculature, which remain patent. 3. Cardiomegaly. Very mild interstitial edema is possible. Small bilateral pleural effusions, partially loculated. 4. Status post aortic and mitral valve replacement. 5. Additional ancillary findings, as above. Electronically signed by: Pinkie Pebbles MD 10/21/2024 07:40 PM EST RP Workstation: [id number]   ECHOCARDIOGRAM LIMITED Result Date: 10/17/2024    ECHOCARDIOGRAM LIMITED REPORT   Patient Name:   [name] Date of Exam: 10/17/2024 Medical Rec #:  [id number]       Height:       62.0 in Accession #:    [id number]      Weight:       142.9 lb Date of Birth:  1946-08-13       BSA:          1.657 m Patient Age:    78 years        BP:           120/75 mmHg Patient Gender: F               HR:           69 bpm. Exam Location:  [facility] Procedure: Limited Echo, Cardiac Doppler and Limited Color Doppler (Both            Spectral and Color Flow Doppler were utilized during procedure). Indications:    S/P Aortic Valve replacement Z95.2  History:        Patient has prior history of Echocardiogram examinations, most                 recent 10/15/2024. CHF, Stroke, Arrythmias:Atrial Fibrillation;                 Risk Factors:Hypertension, Diabetes and Dyslipidemia. Aortic                 Valve: 21 mm Magna bioprosthetic valve is present                 in the aortic position. Procedure Date: 07/18/2013. Mitral                 Valve: 25 mm Manga Ease bioprosthetic valve valve is present in                 the mitral position.  Procedure Date: 07/18/13.                  Aortic Valve: 21 mm Magna Ease bioprosthetic valve is present in                 the aortic position.                 Mitral Valve: 25 mm Magna Ease bioprosthetic valve valve is                 present in the mitral position.  Sonographer:    Aida Pizza RCS Referring Phys: [id number] VISHNU P MALLIPEDDI IMPRESSIONS  1. Limited study.  2. Left ventricular ejection fraction, by estimation, is 60 to 65%. The left ventricle has normal function. The left ventricle has no regional wall motion abnormalities. There is the interventricular septum is flattened in systole and diastole, consistent with right ventricular pressure and volume overload.  3. RVSP not estimated. Right ventricular systolic function is severely reduced. The right ventricular size is moderately enlarged.  4. Papillary muscle calcification noted. Trivial mitral valve regurgitation. Moderate to severe mitral stenosis. The mean mitral valve gradient is 10.0 mmHg. There is  a 25 mm Magna Ease bioprosthetic valve present in the mitral position.  5. There is mild calcification of the prosthetic aortic valve. Aortic valve regurgitation is at least mild, not clear that PHT is accurate and jet somewhat obscured by transmitral flow jet. Moderate to severe aortic valve stenosis. There is a 21 mm Magna Ease bioprosthetic valve present in the aortic position. Aortic valve mean gradient measures 21.3 mmHg. Dimentionless index 0.25. Comparison(s): If clinically reasonable, consider TEE for further evaluation of prosthetic valvular function. Both mitral and aortic mean gradients have increased compared with prior complete study. FINDINGS  Left Ventricle: Left ventricular ejection fraction, by estimation, is 60 to 65%. The left ventricle has normal function. The left ventricle has no regional wall motion abnormalities. The left ventricular internal cavity size was normal in size. There is  no left ventricular hypertrophy. The  interventricular septum is flattened in systole and diastole, consistent with right ventricular pressure and volume overload. Right Ventricle: RVSP not estimated. The right ventricular size is moderately enlarged. No increase in right ventricular wall thickness. Right ventricular systolic function is severely reduced. Mitral Valve: Papillary muscle calcification noted. Trivial mitral valve regurgitation. There is a 25 mm Magna Ease bioprosthetic valve present in the mitral position. Moderate to severe mitral valve stenosis. MV peak gradient, 27.9 mmHg. The mean mitral  valve gradient is 10.0 mmHg. Aortic Valve: There is mild calcification of the aortic valve. Aortic valve regurgitation is mild. Aortic regurgitation PHT measures 175 msec. Moderate to severe aortic stenosis is present. Aortic valve mean gradient measures 21.3 mmHg. Aortic valve peak  gradient measures 37.6 mmHg. Aortic valve area, by VTI measures 0.44 cm. There is a 21 mm Magna Ease bioprosthetic valve present in the aortic position. Pulmonic Valve: The pulmonic valve was grossly normal. Pulmonic valve regurgitation is trivial. Aorta: The aortic root is normal in size and structure. LEFT VENTRICLE PLAX 2D LVIDd:         3.70 cm LVIDs:         2.50 cm LV PW:         1.00 cm LV IVS:        0.80 cm LVOT diam:     1.50 cm LV SV:         29 LV SV Index:   17 LVOT Area:     1.77 cm  LEFT ATRIUM         Index LA diam:    3.90 cm 2.35 cm/m  AORTIC VALVE AV Area (Vmax):    0.47 cm AV Area (Vmean):   0.49 cm AV Area (VTI):     0.44 cm AV Vmax:           306.50 cm/s AV Vmean:          214.333 cm/s AV VTI:            0.652 m AV Peak Grad:      37.6 mmHg AV Mean Grad:      21.3 mmHg LVOT Vmax:         81.17 cm/s LVOT Vmean:        59.367 cm/s LVOT VTI:          0.162 m LVOT/AV VTI ratio: 0.25 AI PHT:            175 msec  AORTA Ao Root diam: 2.60 cm MITRAL VALVE MV Area (PHT): 1.14 cm     SHUNTS MV Area VTI:   0.38 cm     Systemic  VTI:  0.16 m MV Peak grad:   27.9 mmHg    Systemic Diam: 1.50 cm MV Mean grad:  10.0 mmHg MV Vmax:       2.64 m/s MV Vmean:      136.0 cm/s MV Decel Time: 665 msec MV E velocity: 254.00 cm/s Jayson Sierras MD Electronically signed by Jayson Sierras MD Signature Date/Time: 10/17/2024/3:51:52 PM    Final    NM Pulmonary Perfusion Result Date: 10/16/2024 EXAM: NM Lung Perfusion Scan. CLINICAL HISTORY: elevated d dimer Elevated D-dimer. TECHNIQUE: Radiolabeled MAA was administered intravenously and planar images of the lungs were obtained in multiple projections. RADIOPHARMACEUTICAL: 4.4 millicurie Technetium-97m albumin  aggregated (MAA) injection solution. COMPARISON: Paracentesis chest radiograph 10/15/2024. FINDINGS: PERFUSION: Perfusion defect in the right mid lung, related to fluid within the oblique fissure as seen on the prior chest radiograph. No wedge-shaped peripheral perfusion defect, suggesting no acute pulmonary embolism. Right pleural effusion. Cardiomegaly. IMPRESSION: 1. No acute pulmonary embolism. 2. Right pleural effusion and cardiomegaly. Electronically signed by: Norleen Boxer MD 10/16/2024 02:20 PM EST RP Workstation: [id number]   US  CHEST (PLEURAL EFFUSION) Result Date: 10/16/2024 INDICATION: 79 year old female. Endorsing shortness of breath. Found to have a small right-sided pleural effusion. Request is for therapeutic and diagnostic thoracentesis. EXAM: CHEST ULTRASOUND COMPARISON:  CT chest dated October 10, 2024 and chest x-ray dated October 15, 2024 FINDINGS: Small right pleural effusion. No significant pocket of fluid or percutaneous window to allow safe thoracentesis. Patient uncooperative and unable to follow commands. Risks outweigh the benefits. IMPRESSION: Small left-sided pleural effusion with no safe window for percutaneous access. Couple of patient's inability mass the procedure was not performed due to safety concerns. Read by: Delon Beagle, NP Electronically Signed   By: Ester Sides M.D.   On:  10/16/2024 10:15   ECHOCARDIOGRAM LIMITED Result Date: 10/15/2024    ECHOCARDIOGRAM LIMITED REPORT   Patient Name:   [name] Date of Exam: 10/15/2024 Medical Rec #:  [id number]       Height:       62.0 in Accession #:    [id number]      Weight:       149.0 lb Date of Birth:  July 11, 1946       BSA:          1.687 m Patient Age:    78 years        BP:           140/68 mmHg Patient Gender: F               HR:           78 bpm. Exam Location:  [facility] Procedure: Limited Echo, Cardiac Doppler and Color Doppler (Both Spectral and            Color Flow Doppler were utilized during procedure). Indications:    CHF I50.31  History:        Patient has prior history of Echocardiogram examinations, most                 recent 07/22/2024. CHF, Stroke and COPD, Arrythmias:Atrial                 Fibrillation; Risk Factors:Hypertension, Diabetes and                 Dyslipidemia.                 Aortic Valve: 21 mm Magna bioprosthetic valve is present in the  aortic position. Procedure Date: 07/18/2013.                 Mitral Valve: 25 mm Manga Ease bioprosthetic valve valve is                 present in the mitral position. Procedure Date: 07/18/13.  Sonographer:    Tinnie Gosling RDCS Referring Phys: [phone] DAVID TAT IMPRESSIONS  1. Left ventricular ejection fraction, by estimation, is 45%. The left ventricle has mildly decreased function. Left ventricular endocardial border not optimally defined to evaluate regional wall motion. Left ventricular diastolic function could not be evaluated. There is the interventricular septum is flattened in systole and diastole, consistent with right ventricular pressure and volume overload.  2. Right ventricular systolic function is severely reduced. The right ventricular size is moderately enlarged. There is moderately elevated pulmonary artery systolic pressure. The estimated right ventricular systolic pressure is 54.8 mmHg.  3. The inferior vena cava is normal in size  with <50% respiratory variability, suggesting right atrial pressure of 8 mmHg. Comparison(s): Changes from prior study are noted. LV and RV function worsened. FINDINGS  Left Ventricle: Left ventricular ejection fraction, by estimation, is 45%. The left ventricle has mildly decreased function. Left ventricular endocardial border not optimally defined to evaluate regional wall motion. The interventricular septum is flattened in systole and diastole, consistent with right ventricular pressure and volume overload. Left ventricular diastolic function could not be evaluated. Right Ventricle: The right ventricular size is moderately enlarged. No increase in right ventricular wall thickness. Right ventricular systolic function is severely reduced. There is moderately elevated pulmonary artery systolic pressure. The tricuspid regurgitant velocity is 3.42 m/s, and with an assumed right atrial pressure of 8 mmHg, the estimated right ventricular systolic pressure is 54.8 mmHg. Mitral Valve: The mitral valve has been repaired/replaced. There is a 25 mm Manga Ease bioprosthetic valve present in the mitral position. Procedure Date: 07/18/13. Tricuspid Valve: The tricuspid valve is normal in structure. Tricuspid valve regurgitation is mild. Aortic Valve: The aortic valve has been repaired/replaced. There is a 21 mm Magna bioprosthetic valve present in the aortic position. Procedure Date: 07/18/2013. Venous: The inferior vena cava is normal in size with less than 50% respiratory variability, suggesting right atrial pressure of 8 mmHg. LEFT VENTRICLE PLAX 2D LVIDd:         3.50 cm LVIDs:         2.70 cm LV PW:         1.10 cm LV IVS:        0.90 cm  IVC IVC diam: 1.70 cm RIGHT ATRIUM           Index RA Area:     17.00 cm RA Volume:   45.40 ml  26.91 ml/m  TRICUSPID VALVE TR Peak grad:   46.8 mmHg TR Vmax:        342.00 cm/s Vishnu Priya Mallipeddi Electronically signed by Diannah Late Mallipeddi Signature Date/Time:  10/15/2024/4:35:11 PM    Final    CUP PACEART REMOTE DEVICE CHECK Result Date: 10/15/2024 ILR summary report received. Battery status OK. Normal device function. No new symptom, tachy, brady, or pause episodes. No new AF episodes. Monthly summary reports and ROV/PRN LA, CVRS  DG CHEST PORT 1 VIEW Result Date: 10/15/2024 EXAM: 1 VIEW XRAY OF THE CHEST 10/15/2024 11:52:00 AM COMPARISON: 10/08/2024 CLINICAL HISTORY: The patient has acute on chronic heart failure with preserved ejection fraction (HFpEF) and reports dyspnea. ICD10: I50.33 - Acute on chronic diastolic (  congestive) heart failure; R06.00 - Dyspnea, unspecified. FINDINGS: LINES, TUBES AND DEVICES: Prosthetic cardiac valves noted. Left chest loop recorder or leadless pacemaker noted. LUNGS AND PLEURA: Increasing small right pleural effusion. Stable mild pulmonary edema. Chronic bilateral interstitial opacities. No pneumothorax. HEART AND MEDIASTINUM: Stable cardiomegaly and mediastinal contours. Prosthetic cardiac valves noted. Left chest loop recorder or leadless pacemaker noted. BONES AND SOFT TISSUES: Stable right chest wall surgical clips. Median sternotomy wires noted. No acute osseous abnormality. IMPRESSION: 1. Stable mild pulmonary edema and chronic bilateral interstitial opacities. 2. Increasing small right pleural effusion. Electronically signed by: Waddell Calk MD MD 10/15/2024 12:16 PM EST RP Workstation: [id number]   CT CHEST WO CONTRAST Result Date: 10/10/2024 EXAM: CT CHEST WITHOUT CONTRAST 10/10/2024 04:28:35 PM TECHNIQUE: CT of the chest was performed without the administration of intravenous contrast. Multiplanar reformatted images are provided for review. Automated exposure control, iterative reconstruction, and/or weight based adjustment of the mA/kV was utilized to reduce the radiation dose to as low as reasonably achievable. COMPARISON: CT angiogram chest 07/21/2024 and chest x ray 10/18/2024. CLINICAL HISTORY: Respiratory  illness, nondiagnostic xray. FINDINGS: MEDIASTINUM: The heart is moderately enlarged. Coronary and aortic atherosclerotic calcifications are noted. The central airways are clear. LYMPH NODES: Enlarged subcarinal lymph nodes measuring up to 13 mm appear unchanged. Mildly enlarged right hilar lymph nodes are grossly unchanged, but not well delineated secondary to lack of intravenous contrast. Enlarged left hilar lymph nodes measure up to 16 mm and appear unchanged. Mildly enlarged paratracheal and perivascular lymph nodes also appear unchanged. LUNGS AND PLEURA: There is a new trace left pleural effusion. There is a new small right pleural effusion which is partially loculated. Bilateral pleural calcifications are again seen, left greater than right. Numerous calcified granulomas are again seen throughout both lungs. There is some new atelectasis in the right lower lobe. There are mild ground glass opacities throughout both lungs which appears similar to prior and are nonspecific, possibly related to mild edema or infection/inflammation. No pneumothorax. SOFT TISSUES/BONES: Surgical clips in the right axilla. Sternotomy wires are present. UPPER ABDOMEN: Limited images of the upper abdomen demonstrates no acute abnormality. IMPRESSION: 1. New trace left pleural effusion and new small partially loculated right pleural effusion. 2. Mild ground glass opacities throughout both lungs, similar to prior, nonspecific, possibly related to mild edema or infection/inflammation. 3. Enlarged subcarinal, right hilar, left hilar, paratracheal, and perivascular lymph nodes, unchanged. 4. Moderate cardiomegaly with coronary and aortic atherosclerotic calcifications. Electronically signed by: Greig Pique MD MD 10/10/2024 08:01 PM EST RP Workstation: [id number]   DG Chest Portable 1 View Result Date: 10/08/2024 EXAM: 1 VIEW(S) XRAY OF THE CHEST 10/08/2024 09:39:00 PM COMPARISON: 09/11/2024 CLINICAL HISTORY: shortness of breath  FINDINGS: LINES, TUBES AND DEVICES: Loop recorder device noted. LUNGS AND PLEURA: There is mild central pulmonary vascular congestion/interstitial edema. There is some increasing hazy opacities in the right lower lobe, which may represent atelectasis or airspace disease. Stable right pleural effusion. No pneumothorax. HEART AND MEDIASTINUM: Stable cardiomegaly. Coronary artery stent in place. Cardiac valve prostheses in place. BONES AND SOFT TISSUES: Right chest wall surgical clips noted. Nondisplaced median sternotomy wires. No acute osseous abnormality. IMPRESSION: 1. Increasing hazy right lower lobe opacities, which may represent atelectasis or airspace disease. 2. Mild central pulmonary vascular congestion with interstitial edema. 3. Stable right pleural effusion. Electronically signed by: Greig Pique MD 10/08/2024 09:45 PM EST RP Workstation: [id number]    Microbiology: Results for orders placed or performed during the hospital encounter of 10/08/24  Resp panel by RT-PCR (RSV, Flu A&B, Covid) Anterior Nasal Swab     Status: None   Collection Time: 10/09/24 12:05 PM   Specimen: Anterior Nasal Swab  Result Value Ref Range Status   SARS Coronavirus 2 by RT PCR NEGATIVE NEGATIVE Final    Comment: (NOTE) SARS-CoV-2 target nucleic acids are NOT DETECTED.  The SARS-CoV-2 RNA is generally detectable in upper respiratory specimens during the acute phase of infection. The lowest concentration of SARS-CoV-2 viral copies this assay can detect is 138 copies/mL. A negative result does not preclude SARS-Cov-2 infection and should not be used as the sole basis for treatment or other patient management decisions. A negative result may occur with  improper specimen collection/handling, submission of specimen other than nasopharyngeal swab, presence of viral mutation(s) within the areas targeted by this assay, and inadequate number of viral copies(<138 copies/mL). A negative result must be combined  with clinical observations, patient history, and epidemiological information. The expected result is Negative.  Fact Sheet for Patients:  bloggercourse.com  Fact Sheet for Healthcare Providers:  seriousbroker.it  This test is no t yet approved or cleared by the [place]  FDA and  has been authorized for detection and/or diagnosis of SARS-CoV-2 by FDA under an Emergency Use Authorization (EUA). This EUA will remain  in effect (meaning this test can be used) for the duration of the COVID-19 declaration under Section 564(b)(1) of the Act, 21 U.S.C.section 360bbb-3(b)(1), unless the authorization is terminated  or revoked sooner.       Influenza A by PCR NEGATIVE NEGATIVE Final   Influenza B by PCR NEGATIVE NEGATIVE Final    Comment: (NOTE) The Xpert Xpress SARS-CoV-2/FLU/RSV plus assay is intended as an aid in the diagnosis of influenza from Nasopharyngeal swab specimens and should not be used as a sole basis for treatment. Nasal washings and aspirates are unacceptable for Xpert Xpress SARS-CoV-2/FLU/RSV testing.  Fact Sheet for Patients: bloggercourse.com  Fact Sheet for Healthcare Providers: seriousbroker.it  This test is not yet approved or cleared by the [place]  FDA and has been authorized for detection and/or diagnosis of SARS-CoV-2 by FDA under an Emergency Use Authorization (EUA). This EUA will remain in effect (meaning this test can be used) for the duration of the COVID-19 declaration under Section 564(b)(1) of the Act, 21 U.S.C. section 360bbb-3(b)(1), unless the authorization is terminated or revoked.     Resp Syncytial Virus by PCR NEGATIVE NEGATIVE Final    Comment: (NOTE) Fact Sheet for Patients: bloggercourse.com  Fact Sheet for Healthcare Providers: seriousbroker.it  This test is not yet approved  or cleared by the [place]  FDA and has been authorized for detection and/or diagnosis of SARS-CoV-2 by FDA under an Emergency Use Authorization (EUA). This EUA will remain in effect (meaning this test can be used) for the duration of the COVID-19 declaration under Section 564(b)(1) of the Act, 21 U.S.C. section 360bbb-3(b)(1), unless the authorization is terminated or revoked.  Performed at [facility], [address] [place], [place] [address]   Respiratory (~20 pathogens) panel by PCR     Status: None   Collection Time: 10/09/24  1:15 PM   Specimen: Nasopharyngeal Swab; Respiratory  Result Value Ref Range Status   Adenovirus NOT DETECTED NOT DETECTED Final   Coronavirus 229E NOT DETECTED NOT DETECTED Final    Comment: (NOTE) The Coronavirus on the Respiratory Panel, DOES NOT test for the novel  Coronavirus (2019 nCoV)    Coronavirus HKU1 NOT DETECTED NOT DETECTED Final  Coronavirus NL63 NOT DETECTED NOT DETECTED Final   Coronavirus OC43 NOT DETECTED NOT DETECTED Final   Metapneumovirus NOT DETECTED NOT DETECTED Final   Rhinovirus / Enterovirus NOT DETECTED NOT DETECTED Final   Influenza A NOT DETECTED NOT DETECTED Final   Influenza B NOT DETECTED NOT DETECTED Final   Parainfluenza Virus 1 NOT DETECTED NOT DETECTED Final   Parainfluenza Virus 2 NOT DETECTED NOT DETECTED Final   Parainfluenza Virus 3 NOT DETECTED NOT DETECTED Final   Parainfluenza Virus 4 NOT DETECTED NOT DETECTED Final   Respiratory Syncytial Virus NOT DETECTED NOT DETECTED Final   Bordetella pertussis NOT DETECTED NOT DETECTED Final   Bordetella Parapertussis NOT DETECTED NOT DETECTED Final   Chlamydophila pneumoniae NOT DETECTED NOT DETECTED Final   Mycoplasma pneumoniae NOT DETECTED NOT DETECTED Final    Comment: Performed at [facility], [address], [place], [place] [address]  Urine Culture (for pregnant, neutropenic or urologic patients or patients with an indwelling urinary catheter)      Status: Abnormal   Collection Time: 10/12/24  2:02 PM   Specimen: Urine, Catheterized  Result Value Ref Range Status   Specimen Description   Final    URINE, CATHETERIZED Performed at [facility], [address], [place], [place] [address]    Special Requests   Final    NONE Performed at [facility], [address] [place], [place] [address]    Culture 60,000 COLONIES/mL ESCHERICHIA COLI (A)  Final   Report Status 10/15/2024 FINAL  Final   Organism ID, Bacteria ESCHERICHIA COLI (A)  Final      Susceptibility   Escherichia coli - MIC*    AMPICILLIN  >=32 RESISTANT Resistant     CEFAZOLIN  (URINE) Value in next row Sensitive      4 SENSITIVEThis is a modified FDA-approved test that has been validated and its performance characteristics determined by the reporting laboratory.  This laboratory is certified under the Clinical Laboratory Improvement Amendments CLIA as qualified to perform high complexity clinical laboratory testing.    CEFEPIME Value in next row Sensitive      4 SENSITIVEThis is a modified FDA-approved test that has been validated and its performance characteristics determined by the reporting laboratory.  This laboratory is certified under the Clinical Laboratory Improvement Amendments CLIA as qualified to perform high complexity clinical laboratory testing.    ERTAPENEM Value in next row Sensitive      4 SENSITIVEThis is a modified FDA-approved test that has been validated and its performance characteristics determined by the reporting laboratory.  This laboratory is certified under the Clinical Laboratory Improvement Amendments CLIA as qualified to perform high complexity clinical laboratory testing.    CEFTRIAXONE  Value in next row Sensitive      4 SENSITIVEThis is a modified FDA-approved test that has been validated and its performance characteristics determined by the reporting laboratory.  This laboratory is certified under the Clinical Laboratory Improvement Amendments CLIA  as qualified to perform high complexity clinical laboratory testing.    CIPROFLOXACIN Value in next row Sensitive      4 SENSITIVEThis is a modified FDA-approved test that has been validated and its performance characteristics determined by the reporting laboratory.  This laboratory is certified under the Clinical Laboratory Improvement Amendments CLIA as qualified to perform high complexity clinical laboratory testing.    GENTAMICIN Value in next row Sensitive      4 SENSITIVEThis is a modified FDA-approved test that has been validated and its performance characteristics determined by  the reporting laboratory.  This laboratory is certified under the Clinical Laboratory Improvement Amendments CLIA as qualified to perform high complexity clinical laboratory testing.    NITROFURANTOIN  Value in next row Sensitive      4 SENSITIVEThis is a modified FDA-approved test that has been validated and its performance characteristics determined by the reporting laboratory.  This laboratory is certified under the Clinical Laboratory Improvement Amendments CLIA as qualified to perform high complexity clinical laboratory testing.    TRIMETH/SULFA Value in next row Sensitive      4 SENSITIVEThis is a modified FDA-approved test that has been validated and its performance characteristics determined by the reporting laboratory.  This laboratory is certified under the Clinical Laboratory Improvement Amendments CLIA as qualified to perform high complexity clinical laboratory testing.    AMPICILLIN /SULBACTAM Value in next row Resistant      4 SENSITIVEThis is a modified FDA-approved test that has been validated and its performance characteristics determined by the reporting laboratory.  This laboratory is certified under the Clinical Laboratory Improvement Amendments CLIA as qualified to perform high complexity clinical laboratory testing.    PIP/TAZO Value in next row Sensitive      <=4 SENSITIVEThis is a modified  FDA-approved test that has been validated and its performance characteristics determined by the reporting laboratory.  This laboratory is certified under the Clinical Laboratory Improvement Amendments CLIA as qualified to perform high complexity clinical laboratory testing.    MEROPENEM Value in next row Sensitive      <=4 SENSITIVEThis is a modified FDA-approved test that has been validated and its performance characteristics determined by the reporting laboratory.  This laboratory is certified under the Clinical Laboratory Improvement Amendments CLIA as qualified to perform high complexity clinical laboratory testing.    * 60,000 COLONIES/mL ESCHERICHIA COLI  Resp panel by RT-PCR (RSV, Flu A&B, Covid) Anterior Nasal Swab     Status: None   Collection Time: 10/22/24 11:10 AM   Specimen: Anterior Nasal Swab  Result Value Ref Range Status   SARS Coronavirus 2 by RT PCR NEGATIVE NEGATIVE Final   Influenza A by PCR NEGATIVE NEGATIVE Final   Influenza B by PCR NEGATIVE NEGATIVE Final    Comment: (NOTE) The Xpert Xpress SARS-CoV-2/FLU/RSV plus assay is intended as an aid in the diagnosis of influenza from Nasopharyngeal swab specimens and should not be used as a sole basis for treatment. Nasal washings and aspirates are unacceptable for Xpert Xpress SARS-CoV-2/FLU/RSV testing.  Fact Sheet for Patients: bloggercourse.com  Fact Sheet for Healthcare Providers: seriousbroker.it  This test is not yet approved or cleared by the [place]  FDA and has been authorized for detection and/or diagnosis of SARS-CoV-2 by FDA under an Emergency Use Authorization (EUA). This EUA will remain in effect (meaning this test can be used) for the duration of the COVID-19 declaration under Section 564(b)(1) of the Act, 21 U.S.C. section 360bbb-3(b)(1), unless the authorization is terminated or revoked.     Resp Syncytial Virus by PCR NEGATIVE NEGATIVE Final     Comment: (NOTE) Fact Sheet for Patients: bloggercourse.com  Fact Sheet for Healthcare Providers: seriousbroker.it  This test is not yet approved or cleared by the [place]  FDA and has been authorized for detection and/or diagnosis of SARS-CoV-2 by FDA under an Emergency Use Authorization (EUA). This EUA will remain in effect (meaning this test can be used) for the duration of the COVID-19 declaration under Section 564(b)(1) of the Act, 21 U.S.C. section 360bbb-3(b)(1), unless the authorization is terminated or revoked.  Performed at [facility], [address], [place], [place] [address]    *Note: Due to a large number of results and/or encounters for the requested time period, some results have not been displayed. A complete set of results can be found in Results Review.    Labs: CBC: Recent Labs  Lab 10/21/24 0430 10/22/24 0321 10/23/24 0500 10/24/24 0433 10/24/24 0811 10/24/24 0817 10/25/24 0815  WBC 9.4 7.7 9.1 8.5  --   --  9.9  HGB 11.1* 10.3* 10.2* 10.7* 11.9*  11.6* 9.5* 12.0  HCT 36.3 33.8* 32.8* 34.7* 35.0*  34.0* 28.0* 39.7  MCV 86.4 87.3 85.2 85.7  --   --  88.6  PLT 88* 80* 102* 112*  --   --  115*   Basic Metabolic Panel: Recent Labs  Lab 10/21/24 1055 10/22/24 0924 10/23/24 0500 10/24/24 0433 10/24/24 0811 10/24/24 0817 10/25/24 0815  NA 131* 137 136 137 135  136 143 135  K 5.9* 4.4 3.5 4.0 3.8  3.7 2.9* 4.1  CL 98 100 97* 99  --   --  100  CO2 22 25 27 25   --   --  21*  GLUCOSE 182* 112* 92 109*  --   --  111*  BUN 18 15 12 12   --   --  11  CREATININE 1.25* 1.25* 1.26* 1.29*  --   --  1.27*  CALCIUM  9.2 9.1 8.6* 8.7*  --   --  9.2  MG  --   --   --   --   --   --  2.0   Liver Function Tests: No results for input(s): AST, ALT, ALKPHOS, BILITOT, PROT, ALBUMIN  in the last 168 hours. CBG: Recent Labs  Lab 10/25/24 1116 10/25/24 1545 10/25/24 2111 10/26/24 0621  10/26/24 1137  GLUCAP 158* 126* 131* 136* 174*    Discharge time spent: greater than 30 minutes.  Signed: Elidia Toribio Furnace, MD [facility] 10/26/2024 "

## 2024-10-26 NOTE — TOC Transition Note (Addendum)
 Transition of Care [facility]) - Discharge Note   Patient Details  Name: [name] MRN: [id number] Date of Birth: [birth year]  [facility]) CM/SW Contact:  Marval Gell, RN Phone Number: 10/26/2024, 12:06 PM   Clinical Narrative:     Spoke with patient and spouse at bedside. They both want to discharge home today with home hospice. They want [facility]. Patient will need home oxygen  at the house before she goes home. Spouse states that son is going to set up an adjustable bed at the home in a few days.  I spoke w [facility], nurse has ordered oxygen  through [facility] and they plan for it to be delivered to the house today. I have instructed the spouse to call me, and provided him with my cell number, when the oxygen  has been delivered to the house.  Once the spouse calls me to tell me the O2 is at the house I will call [facility] to pick her up.  Spouse has verified the address on file. [facility] forms provided to the unit secretary.   14:45 Spoke with spouse, oxygen  has been delivered. [facility] is called, bedside nurse is aware.      Barriers to Discharge: Continued Medical Work up   Patient Goals and CMS Choice Patient states their goals for this hospitalization and ongoing recovery are:: return home   Choice offered to / list presented to : Spouse [place] ownership interest in [facility].provided to::  (n/a)    Discharge Placement                       Discharge Plan and Services Additional resources added to the After Visit Summary for   In-house Referral: Clinical Social Work   Post Acute Care Choice: Resumption of Svcs/PTA Provider                    HH Arranged: PT HH Agency: [facility] Date [facility] Agency Contacted: 10/09/24 Time HH Agency Contacted: 1138 Representative spoke with at [facility] Agency: [facility]  Social Drivers of Health (SDOH) Interventions SDOH Screenings   Food Insecurity: No Food Insecurity (10/09/2024)   Housing: Low Risk (10/09/2024)  Transportation Needs: No Transportation Needs (10/09/2024)  Utilities: Not At Risk (10/09/2024)  Social Connections: Moderately Isolated (10/09/2024)  Tobacco Use: Low Risk (10/08/2024)     Readmission Risk Interventions    10/09/2024   11:16 AM 09/17/2024   11:14 AM 09/16/2024   11:53 AM  Readmission Risk Prevention Plan  Transportation Screening Complete Complete Complete  Medication Review Oceanographer) Complete Complete Complete  PCP or Specialist appointment within 3-5 days of discharge  Complete   HRI or Home Care Consult Complete Complete Complete  SW Recovery Care/Counseling Consult Complete Complete Complete  Palliative Care Screening Not Applicable Not Applicable Not Applicable  Skilled Nursing Facility Not Applicable Not Applicable Not Applicable
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
## 2024-10-28 ENCOUNTER — Telehealth: Payer: Self-pay

## 2024-10-28 NOTE — Transitions of Care (Post Inpatient/ED Visit) (Signed)
" ° °  10/28/2024  Name: [name] MRN: [id number] DOB: [birth year]  Today's TOC FU Call Status: Today's TOC FU Call Status:: Successful TOC FU Call Completed TOC FU Call Complete Date: 10/28/24  Patient's Name and Date of Birth confirmed. Name, DOB  Transition Care Management Follow-up Telephone Call Date of Discharge: 10/26/24 Discharge Facility: [name]) Type of Discharge: Inpatient Admission Primary Inpatient Discharge Diagnosis:: Acute on chronic diastolic (congestive) heart failure How have you been since you were released from the hospital?: Better Any questions or concerns?: No  Items Reviewed: Did you receive and understand the discharge instructions provided?: Yes   RN made outreach to patient/family and confirmed that hospice services are in place.  Patient/family aware to contact hospice 24/7 for any questions or concerns. No further interventions at this time.    [name] J. Shea Swalley RN, MSN [name], [name] RN Care Manager Direct Dial: [phone]  Fax: [phone] Website: [email or web address]   "

## 2024-11-04 ENCOUNTER — Telehealth: Payer: Self-pay

## 2024-11-04 DIAGNOSIS — I509 Heart failure, unspecified: Secondary | ICD-10-CM

## 2024-11-04 NOTE — Progress Notes (Signed)
 Complex Care Management Note Care Guide Note  11/04/2024 Name: [name] MRN: [id number] DOB: January 12, 1946   Complex Care Management Outreach Attempts: An unsuccessful telephone outreach was attempted today to offer the patient information about available complex care management services.  Follow Up Plan:  No further outreach attempts will be made at this time. We have been unable to contact the patient to offer or enroll patient in complex care management services.  Encounter Outcome:  Patient Refused  Leotis Rase [facility], [facility] Guide  Direct Dial: [phone]  Fax [phone]

## 2024-11-05 ENCOUNTER — Ambulatory Visit: Admitting: Neurology

## 2024-11-11 ENCOUNTER — Ambulatory Visit (HOSPITAL_COMMUNITY)

## 2024-11-14 ENCOUNTER — Encounter

## 2024-11-15 ENCOUNTER — Ambulatory Visit

## 2024-12-02 ENCOUNTER — Ambulatory Visit: Admitting: Cardiology

## 2024-12-16 ENCOUNTER — Ambulatory Visit

## 2024-12-16 ENCOUNTER — Encounter
